# Patient Record
Sex: Male | Born: 1950
Health system: Southern US, Community
[De-identification: ages and names within clinical notes are randomized; demographics above are authoritative.]

## PROBLEM LIST (undated history)

## (undated) DIAGNOSIS — G629 Polyneuropathy, unspecified: Secondary | ICD-10-CM

## (undated) DIAGNOSIS — E119 Type 2 diabetes mellitus without complications: Secondary | ICD-10-CM

## (undated) DIAGNOSIS — I639 Cerebral infarction, unspecified: Secondary | ICD-10-CM

## (undated) DIAGNOSIS — I96 Gangrene, not elsewhere classified: Secondary | ICD-10-CM

## (undated) DIAGNOSIS — I10 Essential (primary) hypertension: Secondary | ICD-10-CM

## (undated) DIAGNOSIS — K219 Gastro-esophageal reflux disease without esophagitis: Secondary | ICD-10-CM

## (undated) DIAGNOSIS — D649 Anemia, unspecified: Secondary | ICD-10-CM

## (undated) DIAGNOSIS — J189 Pneumonia, unspecified organism: Secondary | ICD-10-CM

## (undated) DIAGNOSIS — I5032 Chronic diastolic (congestive) heart failure: Secondary | ICD-10-CM

## (undated) DIAGNOSIS — N400 Enlarged prostate without lower urinary tract symptoms: Secondary | ICD-10-CM

## (undated) DIAGNOSIS — I2510 Atherosclerotic heart disease of native coronary artery without angina pectoris: Secondary | ICD-10-CM

## (undated) DIAGNOSIS — E785 Hyperlipidemia, unspecified: Secondary | ICD-10-CM

## (undated) DIAGNOSIS — B1920 Unspecified viral hepatitis C without hepatic coma: Secondary | ICD-10-CM

## (undated) DIAGNOSIS — M869 Osteomyelitis, unspecified: Secondary | ICD-10-CM

## (undated) DIAGNOSIS — N183 Chronic kidney disease, stage 3 unspecified: Secondary | ICD-10-CM

## (undated) DIAGNOSIS — L03119 Cellulitis of unspecified part of limb: Secondary | ICD-10-CM

## (undated) DIAGNOSIS — L02619 Cutaneous abscess of unspecified foot: Secondary | ICD-10-CM

## (undated) DIAGNOSIS — N189 Chronic kidney disease, unspecified: Secondary | ICD-10-CM

## (undated) DIAGNOSIS — I739 Peripheral vascular disease, unspecified: Secondary | ICD-10-CM

## (undated) DIAGNOSIS — J45909 Unspecified asthma, uncomplicated: Secondary | ICD-10-CM

## (undated) DIAGNOSIS — I998 Other disorder of circulatory system: Secondary | ICD-10-CM

## (undated) DIAGNOSIS — J449 Chronic obstructive pulmonary disease, unspecified: Secondary | ICD-10-CM

## (undated) DIAGNOSIS — Z8701 Personal history of pneumonia (recurrent): Secondary | ICD-10-CM

## (undated) HISTORY — DX: Hyperlipidemia, unspecified: E78.5

## (undated) HISTORY — DX: Peripheral vascular disease, unspecified: I73.9

## (undated) HISTORY — DX: Personal history of pneumonia (recurrent): Z87.01

## (undated) HISTORY — DX: Atherosclerotic heart disease of native coronary artery without angina pectoris: I25.10

## (undated) HISTORY — PX: TONSILLECTOMY: SUR1361

## (undated) HISTORY — DX: Gastro-esophageal reflux disease without esophagitis: K21.9

## (undated) HISTORY — DX: Essential (primary) hypertension: I10

## (undated) HISTORY — PX: BACK SURGERY: SHX140

## (undated) HISTORY — DX: Anemia, unspecified: D64.9

## (undated) HISTORY — DX: Unspecified asthma, uncomplicated: J45.909

## (undated) HISTORY — DX: Chronic kidney disease, stage 3 unspecified: N18.30

---

## 2000-08-29 ENCOUNTER — Emergency Department (HOSPITAL_COMMUNITY): Admission: EM | Admit: 2000-08-29 | Discharge: 2000-08-29 | Payer: Self-pay | Admitting: Internal Medicine

## 2001-01-25 ENCOUNTER — Encounter: Payer: Self-pay | Admitting: Emergency Medicine

## 2001-01-25 ENCOUNTER — Emergency Department (HOSPITAL_COMMUNITY): Admission: EM | Admit: 2001-01-25 | Discharge: 2001-01-26 | Payer: Self-pay | Admitting: Emergency Medicine

## 2003-02-12 HISTORY — PX: LIVER BIOPSY: SHX301

## 2007-10-04 ENCOUNTER — Emergency Department (HOSPITAL_COMMUNITY): Admission: EM | Admit: 2007-10-04 | Discharge: 2007-10-04 | Payer: Self-pay | Admitting: Emergency Medicine

## 2008-03-26 ENCOUNTER — Emergency Department (HOSPITAL_COMMUNITY): Admission: EM | Admit: 2008-03-26 | Discharge: 2008-03-26 | Payer: Self-pay | Admitting: Emergency Medicine

## 2008-03-29 ENCOUNTER — Emergency Department (HOSPITAL_COMMUNITY): Admission: EM | Admit: 2008-03-29 | Discharge: 2008-03-29 | Payer: Self-pay | Admitting: Emergency Medicine

## 2010-05-29 LAB — CBC
HCT: 40.6 % (ref 39.0–52.0)
Hemoglobin: 13.8 g/dL (ref 13.0–17.0)
MCHC: 33.9 g/dL (ref 30.0–36.0)
MCV: 90.2 fL (ref 78.0–100.0)
Platelets: 179 10*3/uL (ref 150–400)
RBC: 4.51 MIL/uL (ref 4.22–5.81)
RDW: 12.7 % (ref 11.5–15.5)
WBC: 9 10*3/uL (ref 4.0–10.5)

## 2010-05-29 LAB — DIFFERENTIAL
Basophils Absolute: 0 10*3/uL (ref 0.0–0.1)
Basophils Relative: 1 % (ref 0–1)
Eosinophils Absolute: 0.2 10*3/uL (ref 0.0–0.7)
Eosinophils Relative: 2 % (ref 0–5)
Lymphocytes Relative: 31 % (ref 12–46)
Lymphs Abs: 2.8 10*3/uL (ref 0.7–4.0)
Monocytes Absolute: 0.7 10*3/uL (ref 0.1–1.0)
Monocytes Relative: 7 % (ref 3–12)
Neutro Abs: 5.3 10*3/uL (ref 1.7–7.7)
Neutrophils Relative %: 59 % (ref 43–77)

## 2010-05-29 LAB — BASIC METABOLIC PANEL
BUN: 14 mg/dL (ref 6–23)
CO2: 28 mEq/L (ref 19–32)
Calcium: 9.3 mg/dL (ref 8.4–10.5)
Chloride: 99 mEq/L (ref 96–112)
Creatinine, Ser: 0.82 mg/dL (ref 0.4–1.5)
GFR calc Af Amer: >60 mL/min (ref >60)
GFR calc non Af Amer: >60 mL/min (ref >60)
Glucose, Bld: 370 mg/dL — ABNORMAL HIGH (ref 70–99)
Potassium: 3.7 mEq/L (ref 3.5–5.1)
Sodium: 135 mEq/L (ref 135–145)

## 2010-05-29 LAB — GLUCOSE, CAPILLARY: Glucose-Capillary: 387 mg/dL — ABNORMAL HIGH (ref 70–99)

## 2011-02-03 ENCOUNTER — Emergency Department (HOSPITAL_COMMUNITY): Payer: Medicaid Other

## 2011-02-03 ENCOUNTER — Inpatient Hospital Stay (HOSPITAL_COMMUNITY)
Admission: EM | Admit: 2011-02-03 | Discharge: 2011-02-07 | DRG: 066 | Disposition: A | Discharge status: Home / Self Care | Payer: Medicaid Other | Attending: Internal Medicine | Admitting: Internal Medicine

## 2011-02-03 ENCOUNTER — Other Ambulatory Visit: Payer: Self-pay

## 2011-02-03 DIAGNOSIS — IMO0001 Reserved for inherently not codable concepts without codable children: Secondary | ICD-10-CM | POA: Diagnosis present

## 2011-02-03 DIAGNOSIS — F17200 Nicotine dependence, unspecified, uncomplicated: Secondary | ICD-10-CM | POA: Diagnosis present

## 2011-02-03 DIAGNOSIS — Z720 Tobacco use: Secondary | ICD-10-CM | POA: Diagnosis present

## 2011-02-03 DIAGNOSIS — F1910 Other psychoactive substance abuse, uncomplicated: Secondary | ICD-10-CM | POA: Diagnosis present

## 2011-02-03 DIAGNOSIS — F1010 Alcohol abuse, uncomplicated: Secondary | ICD-10-CM | POA: Diagnosis present

## 2011-02-03 DIAGNOSIS — F1410 Cocaine abuse, uncomplicated: Secondary | ICD-10-CM | POA: Diagnosis present

## 2011-02-03 DIAGNOSIS — I639 Cerebral infarction, unspecified: Secondary | ICD-10-CM

## 2011-02-03 DIAGNOSIS — R209 Unspecified disturbances of skin sensation: Secondary | ICD-10-CM | POA: Diagnosis present

## 2011-02-03 DIAGNOSIS — E785 Hyperlipidemia, unspecified: Secondary | ICD-10-CM | POA: Diagnosis present

## 2011-02-03 DIAGNOSIS — I6350 Cerebral infarction due to unspecified occlusion or stenosis of unspecified cerebral artery: Principal | ICD-10-CM | POA: Diagnosis present

## 2011-02-03 DIAGNOSIS — I10 Essential (primary) hypertension: Secondary | ICD-10-CM | POA: Diagnosis present

## 2011-02-03 DIAGNOSIS — R202 Paresthesia of skin: Secondary | ICD-10-CM | POA: Diagnosis present

## 2011-02-03 DIAGNOSIS — E1165 Type 2 diabetes mellitus with hyperglycemia: Secondary | ICD-10-CM

## 2011-02-03 DIAGNOSIS — R739 Hyperglycemia, unspecified: Secondary | ICD-10-CM

## 2011-02-03 HISTORY — DX: Essential (primary) hypertension: I10

## 2011-02-03 HISTORY — DX: Unspecified viral hepatitis C without hepatic coma: B19.20

## 2011-02-03 LAB — URINALYSIS, ROUTINE W REFLEX MICROSCOPIC
Glucose, UA: 500 mg/dL — AB
Leukocytes, UA: NEGATIVE
Nitrite: NEGATIVE
Protein, ur: NEGATIVE mg/dL

## 2011-02-03 LAB — DIFFERENTIAL
Basophils Relative: 0 % (ref 0–1)
Eosinophils Absolute: 0.2 10*3/uL (ref 0.0–0.7)
Monocytes Absolute: 0.5 10*3/uL (ref 0.1–1.0)
Monocytes Relative: 7 % (ref 3–12)

## 2011-02-03 LAB — COMPREHENSIVE METABOLIC PANEL
Albumin: 3.8 g/dL (ref 3.5–5.2)
BUN: 11 mg/dL (ref 6–23)
Chloride: 103 mEq/L (ref 96–112)
Creatinine, Ser: 0.9 mg/dL (ref 0.50–1.35)
GFR calc Af Amer: >90 mL/min (ref >90)
Total Bilirubin: 0.3 mg/dL (ref 0.3–1.2)
Total Protein: 8.2 g/dL (ref 6.0–8.3)

## 2011-02-03 LAB — CBC
HCT: 39.2 % (ref 39.0–52.0)
Hemoglobin: 13 g/dL (ref 13.0–17.0)
MCH: 29.4 pg (ref 26.0–34.0)
MCHC: 33.2 g/dL (ref 30.0–36.0)

## 2011-02-03 LAB — GLUCOSE, CAPILLARY: Glucose-Capillary: 172 mg/dL — ABNORMAL HIGH (ref 70–99)

## 2011-02-03 LAB — RAPID URINE DRUG SCREEN, HOSP PERFORMED
Amphetamines: NOT DETECTED
Opiates: NOT DETECTED

## 2011-02-03 MED ORDER — ASPIRIN 325 MG PO TABS
325.0000 mg | ORAL_TABLET | Freq: Every day | ORAL | Status: DC
  Administered 2011-02-03 – 2011-02-07 (×4): 325 mg via ORAL
  Filled 2011-02-03 (×4): qty 1

## 2011-02-03 MED ORDER — ASPIRIN 300 MG RE SUPP
300.0000 mg | Freq: Every day | RECTAL | Status: DC
  Administered 2011-02-05: 300 mg via RECTAL
  Filled 2011-02-03 (×4): qty 1

## 2011-02-03 MED ORDER — HYDRALAZINE HCL 20 MG/ML IJ SOLN
5.0000 mg | Freq: Four times a day (QID) | INTRAMUSCULAR | Status: DC | PRN
  Administered 2011-02-04: 5 mg via INTRAVENOUS
  Filled 2011-02-03: qty 1

## 2011-02-03 MED ORDER — ACETAMINOPHEN 650 MG RE SUPP: 650.0000 mg | RECTAL | Status: DC | PRN

## 2011-02-03 MED ORDER — SODIUM CHLORIDE 0.9 % IJ SOLN
INTRAMUSCULAR | Status: AC
  Filled 2011-02-03: qty 3

## 2011-02-03 MED ORDER — ENOXAPARIN SODIUM 40 MG/0.4ML ~~LOC~~ SOLN
40.0000 mg | SUBCUTANEOUS | Status: DC
  Administered 2011-02-03 – 2011-02-06 (×4): 40 mg via SUBCUTANEOUS
  Filled 2011-02-03 (×4): qty 0.4

## 2011-02-03 MED ORDER — SENNOSIDES-DOCUSATE SODIUM 8.6-50 MG PO TABS: 1.0000 | ORAL_TABLET | Freq: Every evening | ORAL | Status: DC | PRN

## 2011-02-03 MED ORDER — INSULIN ASPART 100 UNIT/ML ~~LOC~~ SOLN: 0.0000 [IU] | Freq: Every day | SUBCUTANEOUS | Status: DC

## 2011-02-03 MED ORDER — ONDANSETRON HCL 4 MG/2ML IJ SOLN: 4.0000 mg | Freq: Four times a day (QID) | INTRAMUSCULAR | Status: DC | PRN

## 2011-02-03 MED ORDER — SIMVASTATIN 20 MG PO TABS
20.0000 mg | ORAL_TABLET | Freq: Every day | ORAL | Status: DC
  Administered 2011-02-04 – 2011-02-06 (×3): 20 mg via ORAL
  Filled 2011-02-03 (×3): qty 1

## 2011-02-03 MED ORDER — LORAZEPAM 1 MG PO TABS
1.0000 mg | ORAL_TABLET | Freq: Four times a day (QID) | ORAL | Status: AC | PRN
  Filled 2011-02-03: qty 1

## 2011-02-03 MED ORDER — NICOTINE 14 MG/24HR TD PT24
14.0000 mg | MEDICATED_PATCH | Freq: Every day | TRANSDERMAL | Status: DC
  Administered 2011-02-03 – 2011-02-07 (×4): 14 mg via TRANSDERMAL
  Filled 2011-02-03 (×5): qty 1

## 2011-02-03 MED ORDER — INSULIN ASPART 100 UNIT/ML ~~LOC~~ SOLN
0.0000 [IU] | Freq: Three times a day (TID) | SUBCUTANEOUS | Status: DC
  Administered 2011-02-04: 3 [IU] via SUBCUTANEOUS
  Administered 2011-02-04: 2 [IU] via SUBCUTANEOUS
  Administered 2011-02-04: 3 [IU] via SUBCUTANEOUS
  Administered 2011-02-05: 15 [IU] via SUBCUTANEOUS
  Administered 2011-02-05: 5 [IU] via SUBCUTANEOUS
  Administered 2011-02-06 (×2): 3 [IU] via SUBCUTANEOUS
  Administered 2011-02-06: 2 [IU] via SUBCUTANEOUS
  Administered 2011-02-07: 5 [IU] via SUBCUTANEOUS

## 2011-02-03 MED ORDER — SODIUM CHLORIDE 0.9 % IJ SOLN
3.0000 mL | Freq: Two times a day (BID) | INTRAMUSCULAR | Status: DC
  Administered 2011-02-03 – 2011-02-07 (×5): 3 mL via INTRAVENOUS
  Filled 2011-02-03 (×5): qty 3

## 2011-02-03 MED ORDER — LORAZEPAM 2 MG/ML IJ SOLN
1.0000 mg | Freq: Four times a day (QID) | INTRAMUSCULAR | Status: AC | PRN
  Administered 2011-02-03: 1 mg via INTRAVENOUS
  Filled 2011-02-03: qty 1

## 2011-02-03 MED ORDER — ACETAMINOPHEN 325 MG PO TABS: 650.0000 mg | ORAL_TABLET | ORAL | Status: DC | PRN

## 2011-02-03 MED ORDER — HYDROCHLOROTHIAZIDE 25 MG PO TABS: 25.0000 mg | ORAL_TABLET | Freq: Every day | ORAL | Status: DC

## 2011-02-03 MED ORDER — VITAMIN B-1 100 MG PO TABS
100.0000 mg | ORAL_TABLET | Freq: Every day | ORAL | Status: DC
  Administered 2011-02-03 – 2011-02-07 (×5): 100 mg via ORAL
  Filled 2011-02-03 (×5): qty 1

## 2011-02-03 MED ORDER — LORAZEPAM 1 MG PO TABS
0.0000 mg | ORAL_TABLET | Freq: Four times a day (QID) | ORAL | Status: AC
  Administered 2011-02-05: 1 mg via ORAL

## 2011-02-03 MED ORDER — LORAZEPAM 1 MG PO TABS: 0.0000 mg | ORAL_TABLET | Freq: Two times a day (BID) | ORAL | Status: DC

## 2011-02-03 MED ORDER — FOLIC ACID 1 MG PO TABS
1.0000 mg | ORAL_TABLET | Freq: Every day | ORAL | Status: DC
  Administered 2011-02-03 – 2011-02-07 (×5): 1 mg via ORAL
  Filled 2011-02-03 (×5): qty 1

## 2011-02-03 MED ORDER — THIAMINE HCL 100 MG/ML IJ SOLN: 100.0000 mg | Freq: Every day | INTRAMUSCULAR | Status: DC

## 2011-02-03 MED ORDER — ADULT MULTIVITAMIN W/MINERALS CH
1.0000 | ORAL_TABLET | Freq: Every day | ORAL | Status: DC
  Administered 2011-02-03 – 2011-02-07 (×5): 1 via ORAL
  Filled 2011-02-03 (×5): qty 1

## 2011-02-03 NOTE — ED Notes (Signed)
Pt presents with numbness to right side of face, hand and leg since last night. No facial droop noted. Grips normal.

## 2011-02-03 NOTE — ED Notes (Signed)
Patient continues with numbness to right hand, no change from arrival.  RN questioned patient about his current medications, he advised he is supposed to take Metformin, but has no funds to obtain.  Education shared related to health dept, clinic, and other potential resources.  Two females/ family members sitting with patient.  IV site checked, site / dressing clean and infusing appropriately.

## 2011-02-03 NOTE — H&P (Signed)
Cory Weber is an 60 y.o. male.    PCP: He does not have a PCP. Needs one on discharge.  Chief Complaint: Right-sided numbness  HPI: This is a 60 year old, African American male, with a past medical history of, diabetes, hypertension, for which he is not taking any medications. He also has a history of hepatitis C. He was in his usual state of health till yesterday afternoon when he woke up from a nap and found that the right side of his body was numb. This was all the way from the face to the right arm and to right leg. Patient also was having some transient neck pain and right-sided chest pain, which resolved in a few minutes. He slept through the night and this morning when he woke up the numbness persisted. He also gives history of dropping things out of his right hand. He also had a feeling yesterday that his right leg was giving out. But he has been able to ambulate today without any difficulties. Since her symptoms persisted he spoke to his sister's about it and they recommended he come in to the hospital. Currently he still has some numbness in the right side, but denies any weakness on the right side. Denies any chest pain, nausea, vomiting, fever. Denies any recent illness. Denies any difficulty with his vision. Denies any speech difficulties. However, his sister's think that he is alert, but slow in communicating. Denies any difficulty swallowing. Denies any seizure type activity. Denies any problems urinating.   Prior to Admission medications   Not on File    Allergies: No Known Allergies  Past Medical History  Diagnosis Date  . Diabetes mellitus   . Hypertension   . Hepatitis C     Past Surgical History  Procedure Date  . Liver biopsy     Social History:  reports that he has been smoking.  He does not have any smokeless tobacco history on file. He reports that he drinks alcohol. He reports that he uses illicit drugs (Cocaine and Marijuana). he lives by himself in  [place].  Family History:  Family History  Problem Relation Age of Onset  . Breast cancer Mother   . Diabetes Father   . Hypertension Father   . Diabetes Sister   . Hypertension Sister   . Breast cancer Sister     Review of Systems - History obtained from the patient General ROS: negative Psychological ROS: negative Ophthalmic ROS: negative ENT ROS: negative Allergy and Immunology ROS: negative Hematological and Lymphatic ROS: negative Endocrine ROS: negative Respiratory ROS: negative Cardiovascular ROS: negative Gastrointestinal ROS: negative Genito-Urinary ROS: negative Musculoskeletal ROS: negative Neurological ROS: as in hpi Dermatological ROS: negative  Physical Examination Blood pressure 172/100, pulse 87, temperature 98.3 F (36.8 C), temperature source Oral, resp. rate 20, height 6\' 1"  (1.854 m), weight 90.719 kg (200 lb), SpO2 99.00%.  General appearance: alert, cooperative, appears older than stated age and no distress Head: Normocephalic, without obvious abnormality, atraumatic Eyes: conjunctivae/corneas clear. PERRL, EOM's intact. Fundi benign. Throat: normal findings: lips normal without lesions, palate normal, soft palate, uvula, and tonsils normal and palpation of salivary glands negative and abnormal findings: dentition: multiple carries Neck: no adenopathy, no carotid bruit, no JVD, supple, symmetrical, trachea midline and thyroid not enlarged, symmetric, no tenderness/mass/nodules Resp: clear to auscultation bilaterally Cardio: regular rate and rhythm, S1, S2 normal, no murmur, click, rub or gallop GI: soft, non-tender; bowel sounds normal; no masses,  no organomegaly Extremities: extremities normal, atraumatic, no  cyanosis or edema Pulses: 2+ and symmetric Skin: Skin color, texture, turgor normal. No rashes or lesions Lymph nodes: Cervical, supraclavicular, and axillary nodes normal. Neurologic: Mental status: Alert, oriented, thought content  appropriate Cranial nerves: normal Sensory: proprioceptive sense reduced right side on the right Motor: 5/5 Bilaterally Reflexes: 2+ and symmetric Coordination: normal  Results for orders placed during the hospital encounter of 02/03/11 (from the past 48 hour(s))  CBC     Status: Normal   Collection Time   02/03/11  5:34 PM      Component Value Range Comment   WBC 7.9  4.0 - 10.5 (K/uL)    RBC 4.42  4.22 - 5.81 (MIL/uL)    Hemoglobin 13.0  13.0 - 17.0 (g/dL)    HCT 39.2  39.0 - 52.0 (%)    MCV 88.7  78.0 - 100.0 (fL)    MCH 29.4  26.0 - 34.0 (pg)    MCHC 33.2  30.0 - 36.0 (g/dL)    RDW 12.5  11.5 - 15.5 (%)    Platelets 227  150 - 400 (K/uL)   DIFFERENTIAL     Status: Normal   Collection Time   02/03/11  5:34 PM      Component Value Range Comment   Neutrophils Relative 62  43 - 77 (%)    Neutro Abs 4.9  1.7 - 7.7 (K/uL)    Lymphocytes Relative 29  12 - 46 (%)    Lymphs Abs 2.3  0.7 - 4.0 (K/uL)    Monocytes Relative 7  3 - 12 (%)    Monocytes Absolute 0.5  0.1 - 1.0 (K/uL)    Eosinophils Relative 2  0 - 5 (%)    Eosinophils Absolute 0.2  0.0 - 0.7 (K/uL)    Basophils Relative 0  0 - 1 (%)    Basophils Absolute 0.0  0.0 - 0.1 (K/uL)   COMPREHENSIVE METABOLIC PANEL     Status: Abnormal   Collection Time   02/03/11  5:34 PM      Component Value Range Comment   Sodium 137  135 - 145 (mEq/L)    Potassium 3.9  3.5 - 5.1 (mEq/L)    Chloride 103  96 - 112 (mEq/L)    CO2 26  19 - 32 (mEq/L)    Glucose, Bld 215 (*) 70 - 99 (mg/dL)    BUN 11  6 - 23 (mg/dL)    Creatinine, Ser 0.90  0.50 - 1.35 (mg/dL)    Calcium 9.6  8.4 - 10.5 (mg/dL)    Total Protein 8.2  6.0 - 8.3 (g/dL)    Albumin 3.8  3.5 - 5.2 (g/dL)    AST 18  0 - 37 (U/L)    ALT 21  0 - 53 (U/L)    Alkaline Phosphatase 73  39 - 117 (U/L)    Total Bilirubin 0.3  0.3 - 1.2 (mg/dL)    GFR calc non Af Amer >90  >90 (mL/min)    GFR calc Af Amer >90  >90 (mL/min)   ETHANOL     Status: Normal   Collection Time    02/03/11  5:34 PM      Component Value Range Comment   Alcohol, Ethyl (B) <11  0 - 11 (mg/dL)   URINALYSIS, ROUTINE W REFLEX MICROSCOPIC     Status: Abnormal   Collection Time   02/03/11  7:10 PM      Component Value Range Comment   Color, Urine YELLOW  YELLOW  APPearance CLEAR  CLEAR     Specific Gravity, Urine 1.025  1.005 - 1.030     pH 6.0  5.0 - 8.0     Glucose, UA 500 (*) NEGATIVE (mg/dL)    Hgb urine dipstick NEGATIVE  NEGATIVE     Bilirubin Urine NEGATIVE  NEGATIVE     Ketones, ur NEGATIVE  NEGATIVE (mg/dL)    Protein, ur NEGATIVE  NEGATIVE (mg/dL)    Urobilinogen, UA 0.2  0.0 - 1.0 (mg/dL)    Nitrite NEGATIVE  NEGATIVE     Leukocytes, UA NEGATIVE  NEGATIVE  MICROSCOPIC NOT DONE ON URINES WITH NEGATIVE PROTEIN, BLOOD, LEUKOCYTES, NITRITE, OR GLUCOSE <1000 mg/dL.  URINE RAPID DRUG SCREEN (HOSP PERFORMED)     Status: Abnormal   Collection Time   02/03/11  7:10 PM      Component Value Range Comment   Opiates NONE DETECTED  NONE DETECTED     Cocaine POSITIVE (*) NONE DETECTED     Benzodiazepines NONE DETECTED  NONE DETECTED     Amphetamines NONE DETECTED  NONE DETECTED     Tetrahydrocannabinol NONE DETECTED  NONE DETECTED     Barbiturates NONE DETECTED  NONE DETECTED     Dg Chest 2 View  02/03/2011  *RADIOLOGY REPORT*  Clinical Data: Numbness and weakness.  CHEST - 2 VIEW  Comparison: PA and lateral chest 03/26/2008.  Findings: Lungs are clear.  Heart size is normal.  No pneumothorax or effusion.  IMPRESSION: No acute disease.  Original Report Authenticated By: Arvid Right. Luther Parody, M.D.   Ct Head Wo Contrast  02/03/2011  *RADIOLOGY REPORT*  Clinical Data:  Acute onset right-sided upper and lower extremity numbness.  Hypertension.  CT HEAD WITHOUT CONTRAST  Technique: Contiguous axial images were obtained from the base of the skull through the vertex without contrast  Comparison: None  Findings:  There is no evidence of intracranial hemorrhage, brain edema, or other signs of  acute infarction.  There is no evidence of intracranial mass lesion or mass effect.  No abnormal extraaxial fluid collections are identified.  There is no evidence of hydrocephalus, or other significant intracranial abnormality.  No skull abnormality identified.  IMPRESSION: Negative non-contrast head CT.  Original Report Authenticated By: Marlaine Hind, M.D.   EKG shows a sinus bradycardia at 58 beats per minute. Axis is normal. Intervals appear to be in the normal range. No concerning ST or T-wave changes are noted. There is some J-point elevation. No older EKGs available for comparison.  Assessment/Plan  Principal Problem:  *Hemiparesthesia Active Problems:  DM (diabetes mellitus), type 2, uncontrolled  Accelerated hypertension  Polysubstance abuse  Alcohol abuse  Tobacco abuse   #1 right hemiparesthesia: This is most likely a stroke. The neck pain was transient and he no longer has any neck pain. If the MRI of the brain does not show any stroke, cervical spine may have to be worked up. Stroke evaluation will be initiated in this individual. Aspirin will be given. A statin medication will be initiated. Patient has been told to be very compliant with his medications. He's been told that his diabetes, and blood pressure needs to be better controlled. He's also been counseled regarding his drug use.  #2 diabetes, type II, uncontrolled: To check HbA1c. We'll put him on a sliding scale. He will need definitive treatment at the time of discharge. Once all the stroke workup has been completed metformin may be initiated.  #3 accelerated hypertension: For, now we will allow permissive hypertension.  We will give him PRN medications for blood pressure over 180. From, tomorrow we'll initiate him on hydrochlorothiazide for long-term management of hypertension.  #4 polysubstance abuse: Education officer, museum will be involved in this patient's case. He's been provided with counseling.  #5 alcohol abuse: The  patient's sister thinks that he drinks more than one beer every day. Patient is adamant that he drinks only one beer every day. However, we will initiate him on alcohol withdrawal protocol.  #6 tobacco abuse: Counseling will be provided. Nicotine patch will utilize.  DVT, prophylaxis with Lovenox.  Patient is a full code.  Further management decisions will depend on results of further testing and patient's response to treatment.  [facility] Pager [phone]  02/03/2011, 8:13 PM

## 2011-02-03 NOTE — Plan of Care (Signed)
Problem: Consults Goal: Diabetes Guidelines if Diabetic/Glucose > 140 If diabetic or lab glucose is > 140 mg/dl - Initiate Diabetes/Hyperglycemia Guidelines & Document Interventions  Outcome: Progressing Pt not taking meds for diabetes at home. Needs more education and assistance regarding this issue.

## 2011-02-03 NOTE — ED Provider Notes (Signed)
History    This chart was scribed for Richarda Blade, MD, MD by Rhae Lerner. The patient was seen in room APA10 and the patient's care was started at 5:28PM.   CSN: [id number]  Arrival date & time 02/03/11  70   First MD Initiated Contact with Patient 02/03/11 1725      Chief Complaint  Patient presents with  . Numbness    (Consider location/radiation/quality/duration/timing/severity/associated sxs/prior treatment) The history is provided by the patient.   Cory Weber is a 60 y.o. male who presents to the Emergency Department complaining of numbness to right cheek of face, right foot and right arm radiating to hand and fingers onset 1 day ago. Numbness has been constant since onset. Pt denies headache, dizziness, stomach pain, diarrhea and vomiting. Pt has diabetes, HTN and Hepatitis C. Pt reports that he smokes cigarettes (1 pack/day) and drinks alcohol.   Past Medical History  Diagnosis Date  . Diabetes mellitus   . Hypertension     History reviewed. No pertinent past surgical history.  No family history on file.  History  Substance Use Topics  . Smoking status: Current Everyday Smoker -- 1.0 packs/day  . Smokeless tobacco: Not on file  . Alcohol Use: Yes     occ      Review of Systems  All other systems reviewed and are negative.  10 Systems reviewed and are negative for acute change except as noted in the HPI.   Allergies  Review of patient's allergies indicates no known allergies.  Home Medications  No current outpatient prescriptions on file.  BP 172/100  Pulse 87  Temp(Src) 98.3 F (36.8 C) (Oral)  Resp 20  Ht 6\' 1"  (1.854 m)  Wt 200 lb (90.719 kg)  BMI 26.39 kg/m2  SpO2 99%  Physical Exam  Nursing note and vitals reviewed. Constitutional: He is oriented to person, place, and time. He appears well-developed and well-nourished. No distress.  HENT:  Head: Normocephalic and atraumatic.  Left Ear: External ear normal.       Wax in right  ear preventing visualization of tm  Eyes: Conjunctivae and EOM are normal. Pupils are equal, round, and reactive to light.  Neck: Normal range of motion. Neck supple.  Cardiovascular: Normal rate, regular rhythm and normal heart sounds.        No carotid bruits   Pulmonary/Chest: Effort normal and breath sounds normal. No respiratory distress. He has no wheezes.  Abdominal: Soft. Bowel sounds are normal. He exhibits no distension. There is no tenderness.  Musculoskeletal: Normal range of motion. He exhibits no tenderness.       Decreased touch sensation in right arm, right hand, right cheek of face   Neurological: He is alert and oriented to person, place, and time. He has normal strength. He exhibits normal muscle tone. Coordination normal.       No ataxia  Grip nl   Skin: Skin is warm and dry.  Psychiatric: He has a normal mood and affect. His behavior is normal.    ED Course  Procedures (including critical care time)  Date: 02/03/2011  Rate: 58  Rhythm: sinus bradycardia  QRS Axis: normal  Intervals: normal  ST/T Wave abnormalities: normal  Conduction Disutrbances:none  Narrative Interpretation: rate slower  Old EKG Reviewed: changes noted  DIAGNOSTIC STUDIES: Oxygen Saturation is 99% on room air, normal by my interpretation.    COORDINATION OF CARE:   7:22PM Recheck: Pt reports that there is still numbness. EDP discusses lab  results with pt and possible treatment course. EDP tells pt he will be admitted for further testing and ddx of stroke.   Labs Reviewed  COMPREHENSIVE METABOLIC PANEL - Abnormal; Notable for the following:    Glucose, Bld 215 (*)    All other components within normal limits  URINALYSIS, ROUTINE W REFLEX MICROSCOPIC - Abnormal; Notable for the following:    Glucose, UA 500 (*)    All other components within normal limits  URINE RAPID DRUG SCREEN (HOSP PERFORMED) - Abnormal; Notable for the following:    Cocaine POSITIVE (*)    All other components  within normal limits  CBC  DIFFERENTIAL  ETHANOL   Dg Chest 2 View  02/03/2011  *RADIOLOGY REPORT*  Clinical Data: Numbness and weakness.  CHEST - 2 VIEW  Comparison: PA and lateral chest 03/26/2008.  Findings: Lungs are clear.  Heart size is normal.  No pneumothorax or effusion.  IMPRESSION: No acute disease.  Original Report Authenticated By: Arvid Right. Luther Parody, M.D.   Ct Head Wo Contrast  02/03/2011  *RADIOLOGY REPORT*  Clinical Data:  Acute onset right-sided upper and lower extremity numbness.  Hypertension.  CT HEAD WITHOUT CONTRAST  Technique: Contiguous axial images were obtained from the base of the skull through the vertex without contrast  Comparison: None  Findings:  There is no evidence of intracranial hemorrhage, brain edema, or other signs of acute infarction.  There is no evidence of intracranial mass lesion or mass effect.  No abnormal extraaxial fluid collections are identified.  There is no evidence of hydrocephalus, or other significant intracranial abnormality.  No skull abnormality identified.  IMPRESSION: Negative non-contrast head CT.  Original Report Authenticated By: Marlaine Hind, M.D.     1. CVA (cerebral infarction)   2. Hyperglycemia   3. Hypertension       MDM  Evaluation consistent with CVA, likely lacunar infarct. Patient is noncompliant of prior treatment for hypertension, hyperglycemia. No apparent disability associated with his history of hepatitis C. Patient is to be admitted for further risk stratification and management.  I personally performed the services described in this documentation, which was scribed in my presence. The recorded information has been reviewed and considered.        Richarda Blade, MD 02/03/11 [phone]

## 2011-02-04 ENCOUNTER — Observation Stay (HOSPITAL_COMMUNITY): Payer: Medicaid Other

## 2011-02-04 ENCOUNTER — Observation Stay (HOSPITAL_COMMUNITY): Payer: Self-pay

## 2011-02-04 DIAGNOSIS — I517 Cardiomegaly: Secondary | ICD-10-CM

## 2011-02-04 LAB — LIPID PANEL
HDL: 43 mg/dL (ref >39)
LDL Cholesterol: 112 mg/dL — ABNORMAL HIGH (ref 0–99)
Triglycerides: 139 mg/dL (ref <150)
VLDL: 28 mg/dL (ref 0–40)

## 2011-02-04 LAB — GLUCOSE, CAPILLARY

## 2011-02-04 MED ORDER — GLIPIZIDE 5 MG PO TABS
2.5000 mg | ORAL_TABLET | Freq: Every day | ORAL | Status: DC
  Administered 2011-02-05 – 2011-02-06 (×2): 2.5 mg via ORAL
  Filled 2011-02-04 (×3): qty 1

## 2011-02-04 MED ORDER — IOHEXOL 350 MG/ML SOLN
80.0000 mL | Freq: Once | INTRAVENOUS | Status: AC | PRN
  Administered 2011-02-04: 80 mL via INTRAVENOUS

## 2011-02-04 NOTE — Progress Notes (Signed)
Physical Therapy Evaluation Patient Details Name: Cory Weber MRN: [id number] DOB: 1950-10-19 Today's Date: 02/04/2011  Problem List:  Patient Active Problem List  Diagnoses  . DM (diabetes mellitus), type 2, uncontrolled  . Accelerated hypertension  . Hemiparesthesia  . Polysubstance abuse  . Alcohol abuse  . Tobacco abuse    Past Medical History:  Past Medical History  Diagnosis Date  . Diabetes mellitus   . Hypertension   . Hepatitis C    Past Surgical History:  Past Surgical History  Procedure Date  . Liver biopsy     PT Assessment/Plan/Recommendation PT Assessment PT Recommendation/Assessment: Patent does not need any further PT services No Skilled PT: Patient is modified independent with all activity/mobility PT Goals   none needed  PT Evaluation Precautions/Restrictions none   Prior Functioning  Home Living Lives With: Alone Type of Home: House Home Layout: One level Home Access: Stairs to enter Entrance Stairs-Rails: Right Entrance Stairs-Number of Steps: 3 Bathroom Shower/Tub: Chiropodist: Standard Prior Function Level of Independence: Independent with basic ADLs Cognition Cognition Arousal/Alertness: Awake/alert Overall Cognitive Status: Appears within functional limits for tasks assessed Sensation/Coordination Sensation Light Touch: Appears Intact Hot/Cold: Appears Intact Proprioception: Appears Intact Coordination Gross Motor Movements are Fluid and Coordinated: Yes Fine Motor Movements are Fluid and Coordinated: Not tested Extremity Assessment RUE Assessment RUE Assessment: Not tested LUE Assessment LUE Assessment: Not tested RLE Assessment RLE Assessment: Within Functional Limits LLE Assessment LLE Assessment: Within Functional Limits Mobility (including Balance) Bed Mobility Bed Mobility: Yes Supine to Sit: 7: Independent Sitting - Scoot to Edge of Bed: 7: Independent Transfers Transfers: Yes Sit to  Stand: 7: Independent Stand to Sit: 7: Independent Ambulation/Gait Ambulation/Gait: Yes Ambulation/Gait Assistance: 7: Independent Ambulation Distance (Feet): 80 Feet Assistive device: None Gait Pattern: Within Functional Limits Stairs: No Wheelchair Mobility Wheelchair Mobility: No  Posture/Postural Control Posture/Postural Control: No significant limitations Balance Balance Assessed: No Exercise    End of Session PT - End of Session Equipment Utilized During Treatment: Gait belt Activity Tolerance: Patient tolerated treatment well Patient left: in chair;with call bell in reach General Behavior During Session: [facility] for tasks performed Cognition: [facility] for tasks performed  Juergen Hardenbrook,CINDY 02/04/2011, 9:17 AM

## 2011-02-04 NOTE — Progress Notes (Signed)
CSW met with pt regarding substance use. Full note in shadow chart.  Pt reports monthly marijuana and cocaine use and states he only drinks 2-3 beers per week.  He does not feel this is a problem and refuses any treatment.  CSW to sign off.   Salome Arnt

## 2011-02-04 NOTE — Progress Notes (Signed)
Subjective:  He complained of left side fascia and numbness, upper extremity and lower extremity no numbness, but there is no weakness, he admitted noncompliance with his medication, he admitted to smoking, he denies any chest pain or shortness of breath, he denies any nausea or vomiting, during conversation patient admitted he had neck pain when he started having the episode Objective: Vital signs in last 24 hours: Filed Vitals:   02/04/11 0328 02/04/11 0437 02/04/11 0524 02/04/11 0659  BP: 171/91 177/110 172/96 157/85  Pulse: 61 54 82 63  Temp: 97.9 F (36.6 C)  97.7 F (36.5 C)   TempSrc: Oral  Oral   Resp: 20  20   Height:      Weight:   99.7 kg (219 lb 12.8 oz)   SpO2: 96%  95%    Weight change:   Intake/Output Summary (Last 24 hours) at 02/04/11 1153 Last data filed at 02/04/11 0700  Gross per 24 hour  Intake      0 ml  Output    675 ml  Net   -675 ml   Lying comfortably on bed and upper respiratory distress or shortness of breath Neck with no carotid bruit or lymphadenopathy or thyromegaly, poor oral hygiene, with multiple teeth out; no oral/ Heart S1 and S2 with no added sounds Lungs normal vesicular breathing with equal air entry Abdomen soft nontender bowel sounds present Extremities without lower limb edema peripheral pulses intact CNS patient is awake and fully oriented motor 5 over 5 bilaterally upper and lower, reflexes 2+ and symmetrical, some sensory findings motor loss over the right side on his face and hands, cerebral spine including finger-to-nose no dysmetria, no nystagmus,  Lab Results:  [facility] 02/03/11 1734  NA 137  K 3.9  CL 103  CO2 26  GLUCOSE 215*  BUN 11  CREATININE 0.90  CALCIUM 9.6  MG --  PHOS --    Basename 02/03/11 1734  AST 18  ALT 21  ALKPHOS 73  BILITOT 0.3  PROT 8.2  ALBUMIN 3.8   No results found for this basename: LIPASE:2,AMYLASE:2 in the last 72 hours  Basename 02/03/11 1734  WBC 7.9  NEUTROABS 4.9  HGB 13.0    HCT 39.2  MCV 88.7  PLT 227   No results found for this basename: CKTOTAL:3,CKMB:3,CKMBINDEX:3,TROPONINI:3 in the last 72 hours No components found with this basename: POCBNP:3 No results found for this basename: DDIMER:2 in the last 72 hours No results found for this basename: HGBA1C:2 in the last 72 hours  Basename 02/04/11 0605  CHOL 183  HDL 43  LDLCALC 112*  TRIG 139  CHOLHDL 4.3  LDLDIRECT --   No results found for this basename: TSH,T4TOTAL,FREET3,T3FREE,THYROIDAB in the last 72 hours No results found for this basename: VITAMINB12:2,FOLATE:2,FERRITIN:2,TIBC:2,IRON:2,RETICCTPCT:2 in the last 72 hours  Micro Results: No results found for this or any previous visit (from the past 240 hour(s)).  Studies/Results: Dg Chest 2 View  02/03/2011  *RADIOLOGY REPORT*  Clinical Data: Numbness and weakness.  CHEST - 2 VIEW  Comparison: PA and lateral chest 03/26/2008.  Findings: Lungs are clear.  Heart size is normal.  No pneumothorax or effusion.  IMPRESSION: No acute disease.  Original Report Authenticated By: Arvid Right. Luther Parody, M.D.   Ct Head Wo Contrast  02/03/2011  *RADIOLOGY REPORT*  Clinical Data:  Acute onset right-sided upper and lower extremity numbness.  Hypertension.  CT HEAD WITHOUT CONTRAST  Technique: Contiguous axial images were obtained from the base of the skull through  the vertex without contrast  Comparison: None  Findings:  There is no evidence of intracranial hemorrhage, brain edema, or other signs of acute infarction.  There is no evidence of intracranial mass lesion or mass effect.  No abnormal extraaxial fluid collections are identified.  There is no evidence of hydrocephalus, or other significant intracranial abnormality.  No skull abnormality identified.  IMPRESSION: Negative non-contrast head CT.  Original Report Authenticated By: Marlaine Thirza Pellicano, M.D.    Medications: I have reviewed the patient's current medications. Scheduled Meds:   . aspirin  300 mg  Rectal Daily   Or  . aspirin  325 mg Oral Daily  . enoxaparin  40 mg Subcutaneous Q24H  . folic acid  1 mg Oral Daily  . insulin aspart  0-15 Units Subcutaneous TID WC  . insulin aspart  0-5 Units Subcutaneous QHS  . LORazepam  0-4 mg Oral Q6H   Followed by  . LORazepam  0-4 mg Oral Q12H  . mulitivitamin with minerals  1 tablet Oral Daily  . nicotine  14 mg Transdermal Daily  . simvastatin  20 mg Oral q1800  . sodium chloride  3 mL Intravenous Q12H  . sodium chloride      . thiamine  100 mg Oral Daily   Or  . thiamine  100 mg Intravenous Daily  . DISCONTD: hydrochlorothiazide  25 mg Oral Daily   Continuous Infusions:  PRN Meds:.acetaminophen, acetaminophen, LORazepam, LORazepam, ondansetron (ZOFRAN) IV, senna-docusate, DISCONTD: hydrALAZINE  Assessment/Plan: 1-right side hememathesia with episode of neck pain reported me about cervical or carotid dissection,or cocaine induce ischemia currently MRI closed would proceed with CT angio of head and neck, carotid duplex, 2-D echo, I will hold blood pressure medication at this time, agree with aspirin 2- polysubstance abuse:councelling, agree with thiamine and folate 3- DM non compliance. iss will add metformin soon after CT ANGIO, glucotrol  4- cocaine positive in urine: could be related to the patient current symptoms  LOS: 1 day  Charlene Cowdrey I. 02/04/2011, 11:53 AM

## 2011-02-04 NOTE — Progress Notes (Signed)
*  PRELIMINARY RESULTS* Echocardiogram 2D Echocardiogram has been performed.  Tera Partridge 02/04/2011, 11:06 AM

## 2011-02-04 NOTE — Progress Notes (Signed)
UR Chart Review Completed  

## 2011-02-05 ENCOUNTER — Observation Stay (HOSPITAL_COMMUNITY): Payer: Medicaid Other

## 2011-02-05 LAB — BASIC METABOLIC PANEL
GFR calc Af Amer: >90 mL/min (ref >90)
GFR calc non Af Amer: >90 mL/min (ref >90)
Potassium: 3.5 mEq/L (ref 3.5–5.1)
Sodium: 135 mEq/L (ref 135–145)

## 2011-02-05 LAB — GLUCOSE, CAPILLARY
Glucose-Capillary: 351 mg/dL — ABNORMAL HIGH (ref 70–99)
Glucose-Capillary: 169 mg/dL — ABNORMAL HIGH (ref 70–99)

## 2011-02-05 LAB — TSH: TSH: 4.359 u[IU]/mL (ref 0.350–4.500)

## 2011-02-05 MED ORDER — HYDROCHLOROTHIAZIDE 25 MG PO TABS
25.0000 mg | ORAL_TABLET | Freq: Every day | ORAL | Status: DC
  Administered 2011-02-05 – 2011-02-07 (×3): 25 mg via ORAL
  Filled 2011-02-05 (×3): qty 1

## 2011-02-05 MED ORDER — METFORMIN HCL 500 MG PO TABS
500.0000 mg | ORAL_TABLET | Freq: Two times a day (BID) | ORAL | Status: DC
  Administered 2011-02-05 – 2011-02-07 (×4): 500 mg via ORAL
  Filled 2011-02-05 (×4): qty 1

## 2011-02-05 NOTE — Progress Notes (Signed)
Subjective: He still complained of numbness mainly on his right upper extremity right lower extremities, he denies any chest pain or shortness of breath, CT angiogram of the head and neck did not show any evidence of acute stroke orcervical or carotid for dissection, and currently he denies any neck pain  Objective: Vital signs in last 24 hours: Filed Vitals:   02/04/11 2024 02/05/11 0143 02/05/11 0545 02/05/11 1331  BP: 182/96 146/61 151/75 143/73  Pulse: 60 68 59 62  Temp: 98.4 F (36.9 C) 98 F (36.7 C) 97.9 F (36.6 C) 97.8 F (36.6 C)  TempSrc: Oral Oral Oral Oral  Resp: 20 16 16 16   Height:      Weight:   99.2 kg (218 lb 11.1 oz)   SpO2: 97% 95% 90% 92%   Weight change: 8.481 kg (18 lb 11.1 oz)  Intake/Output Summary (Last 24 hours) at 02/05/11 1424 Last data filed at 02/04/11 1737  Gross per 24 hour  Intake    300 ml  Output      0 ml  Net    300 ml    He is lying comfortably on bed his not on respiratory distress or shortness of breath Neck supple with no definite lymph node, no JVP Heart S1 and S2 with no added sounds Lungs examination normal vesicular breathing with equal air entry Abdomen soft nontender bowel sounds present Extremities without edema CNS power 5 over 5 bilaterally lower and upper extremities He has decreased sensation on his lateral side of his arm and as the patient on his lower extremity there is decreased sensation, decreased methocarbamol on the right lower extremity compared to the left  Lab Results:  [facility] 02/05/11 0545 02/03/11 1734  NA 135 137  K 3.5 3.9  CL 101 103  CO2 26 26  GLUCOSE 202* 215*  BUN 8 11  CREATININE 0.90 0.90  CALCIUM 9.6 9.6  MG -- --  PHOS -- --    Basename 02/03/11 1734  AST 18  ALT 21  ALKPHOS 73  BILITOT 0.3  PROT 8.2  ALBUMIN 3.8   No results found for this basename: LIPASE:2,AMYLASE:2 in the last 72 hours  Basename 02/03/11 1734  WBC 7.9  NEUTROABS 4.9  HGB 13.0  HCT 39.2  MCV 88.7    PLT 227   No results found for this basename: CKTOTAL:3,CKMB:3,CKMBINDEX:3,TROPONINI:3 in the last 72 hours No components found with this basename: POCBNP:3 No results found for this basename: DDIMER:2 in the last 72 hours  Basename 02/04/11 0605  HGBA1C 8.5*    Basename 02/04/11 0605  CHOL 183  HDL 43  LDLCALC 112*  TRIG 139  CHOLHDL 4.3  LDLDIRECT --   No results found for this basename: TSH,T4TOTAL,FREET3,T3FREE,THYROIDAB in the last 72 hours No results found for this basename: VITAMINB12:2,FOLATE:2,FERRITIN:2,TIBC:2,IRON:2,RETICCTPCT:2 in the last 72 hours  Micro Results: No results found for this or any previous visit (from the past 240 hour(s)).  Studies/Results: Ct Angio Head W/cm &/or Wo Cm  02/04/2011  *RADIOLOGY REPORT*  Clinical Data:  Right arm numbness.  Neck pain.  Rule out dissection.  Rule out stroke.  Diabetes  CT ANGIOGRAPHY HEAD AND NECK  Technique:  Multidetector CT imaging of the head and neck was performed using the standard protocol during bolus administration of intravenous contrast.  Multiplanar CT image reconstructions including MIPs were obtained to evaluate the vascular anatomy. Carotid stenosis measurements (when applicable) are obtained utilizing NASCET criteria, using the distal internal carotid diameter as the denominator.  Contrast: 66mL OMNIPAQUE IOHEXOL 350 MG/ML IV SOLN  Comparison:  02/03/2011 CT head  CTA NECK  Findings:  Right carotid:  Right common carotid artery is widely patent.  Calcified plaque involving the proximal right internal carotid artery not causing significant stenosis.  Remainder of the right carotid bifurcation is patent.  Negative for carotid dissection.  Left carotid:  Mild left atherosclerotic thickening involving the common carotid artery without significant stenosis.  Small calcified plaque involving the proximal left internal carotid artery.  Negative for carotid dissection.  Vertebral arteries:  Left vertebral artery is  dominant and patent to the basilar. Calcified plaque involving the distal left internal carotid artery.  Right vertebral artery is a small vessel that ends in pica and does not contribute to the basilar.  Negative for mass or adenopathy in the neck.  Contrast opacification is not optimal for the study and there is significant venous contrast degrading the image quality.   Review of the MIP images confirms the above findings.  IMPRESSION: Mild atherosclerotic disease involving the internal carotid artery bilaterally.  No significant carotid stenosis or dissection is identified.  Dominant left vertebral artery with scattered atherosclerotic disease.  Right vertebral artery is small and ends in pica.  CTA HEAD  Findings:  Ventricles are normal.  No enhancing mass lesion is identified.  Negative for acute infarct or hemorrhage.  Arterial opacification is suboptimal due to timing of the injection.  Intracranial vessels are relatively small and irregular compatible with diffuse atherosclerotic disease.  Right vertebral artery ends in pica.  Left vertebral artery contains a calcified plaque and mild stenosis.  There is diffuse disease in the basilar which is patent.  Cerebellar arteries are not optimally evaluated on this study.  Posterior cerebral arteries are patent bilaterally and appear to be diffusely diseased.  Extensive atherosclerotic calcification in the cavernous carotid bilaterally.  Anterior and middle cerebral arteries are diffusely diseased with multiple areas of stenosis and irregularity compatible with atherosclerotic disease.  No large vessel occlusion.  Prominent vessel below the left M1 segment is felt to be a vein.  No definite aneurysm.   Review of the MIP images confirms the above findings.  IMPRESSION: Diffuse intracranial atherosclerotic disease.  This exam is not optimal for evaluation of small vessels however the intracranial disease appears to be moderate to advanced.  Original Report  Authenticated By: Truett Perna, M.D.   Dg Chest 2 View  02/03/2011  *RADIOLOGY REPORT*  Clinical Data: Numbness and weakness.  CHEST - 2 VIEW  Comparison: PA and lateral chest 03/26/2008.  Findings: Lungs are clear.  Heart size is normal.  No pneumothorax or effusion.  IMPRESSION: No acute disease.  Original Report Authenticated By: Arvid Right. Luther Parody, M.D.   Ct Head Wo Contrast  02/03/2011  *RADIOLOGY REPORT*  Clinical Data:  Acute onset right-sided upper and lower extremity numbness.  Hypertension.  CT HEAD WITHOUT CONTRAST  Technique: Contiguous axial images were obtained from the base of the skull through the vertex without contrast  Comparison: None  Findings:  There is no evidence of intracranial hemorrhage, brain edema, or other signs of acute infarction.  There is no evidence of intracranial mass lesion or mass effect.  No abnormal extraaxial fluid collections are identified.  There is no evidence of hydrocephalus, or other significant intracranial abnormality.  No skull abnormality identified.  IMPRESSION: Negative non-contrast head CT.  Original Report Authenticated By: Marlaine Jaymason Ledesma, M.D.   US Carotid Duplex Bilateral  02/05/2011  *RADIOLOGY REPORT*  Clinical Data: Hypertension, stroke, diabetes, smoker  BILATERAL CAROTID DUPLEX ULTRASOUND  Technique: Pearline Cables scale imaging, color Doppler and duplex ultrasound was performed of bilateral carotid and vertebral arteries in the neck.  Comparison:  None.  Criteria:  Quantification of carotid stenosis is based on velocity parameters that correlate the residual internal carotid diameter with NASCET-based stenosis levels, using the diameter of the distal internal carotid lumen as the denominator for stenosis measurement.  The following velocity measurements were obtained:                   PEAK SYSTOLIC/END DIASTOLIC RIGHT ICA:                        75/26cm/sec CCA:                        [id number] SYSTOLIC ICA/CCA RATIO:     [id number] DIASTOLIC ICA/CCA  RATIO:    1.65 ECA:                        77cm/sec  LEFT ICA:                        71/23cm/sec CCA:                        [id number] SYSTOLIC ICA/CCA RATIO:     [id number] DIASTOLIC ICA/CCA RATIO:    1.18 ECA:                        96cm/sec  Findings:  RIGHT CAROTID ARTERY: Very minor carotid atherosclerotic changes. No hemodynamically significant right ICA stenosis, velocity elevation, or turbulent flow.  RIGHT VERTEBRAL ARTERY:  Antegrade  LEFT CAROTID ARTERY: Minor diffuse left carotid plaque formation. No hemodynamically significant left ICA stenosis, velocity elevation, or turbulent flow.  LEFT VERTEBRAL ARTERY:  Antegrade  IMPRESSION: Minor carotid atherosclerosis.  No hemodynamically significant ICA stenosis on either side.  Original Report Authenticated By: Jerilynn Mages. Daryll Brod, M.D.    Medications: I have reviewed the patient's current medications. Scheduled Meds:   . aspirin  300 mg Rectal Daily   Or  . aspirin  325 mg Oral Daily  . enoxaparin  40 mg Subcutaneous Q24H  . folic acid  1 mg Oral Daily  . glipiZIDE  2.5 mg Oral QAC breakfast  . hydrochlorothiazide  25 mg Oral Daily  . insulin aspart  0-15 Units Subcutaneous TID WC  . insulin aspart  0-5 Units Subcutaneous QHS  . LORazepam  0-4 mg Oral Q6H   Followed by  . LORazepam  0-4 mg Oral Q12H  . mulitivitamin with minerals  1 tablet Oral Daily  . nicotine  14 mg Transdermal Daily  . simvastatin  20 mg Oral q1800  . sodium chloride  3 mL Intravenous Q12H  . thiamine  100 mg Oral Daily   Or  . thiamine  100 mg Intravenous Daily   Continuous Infusions:  PRN Meds:.acetaminophen, acetaminophen, LORazepam, LORazepam, ondansetron (ZOFRAN) IV, senna-docusate  Assessment/Plan: 1-left side hemiathesia was no hemiparesis, could be related to TIA, CT and June negative for acute stroke, patient would likely discharge tomorrow, and today he he felt he would like to arrange his outpatient follow up,, and as the patient has numbness on his  right lower extremity but has would be addressed there has a good circulation by  examination, but diminishedpulse on the right. Would order ABI OF his right lower extremity, we'll ask case manager to see the patient in a.m. for medication and arrange followup, patient admitted he does not havePHONE even to call for has health serv. 2- HTN on HCTZ 3-mellitus noncompliance with  glipizide and add metformin 500 mg by mouth twice a day 4- RIght lower extremity numbness:could be diabetic complication but has diminished pulse on his lower extremity compare with left , ABI , can be dc afterwards to follow up with MD  As out patient.  LOS: 2 days  Lealer Marsland I. 02/05/2011, 2:24 PM

## 2011-02-06 ENCOUNTER — Observation Stay (HOSPITAL_COMMUNITY): Payer: Medicaid Other

## 2011-02-06 LAB — GLUCOSE, CAPILLARY
Glucose-Capillary: 164 mg/dL — ABNORMAL HIGH (ref 70–99)
Glucose-Capillary: 132 mg/dL — ABNORMAL HIGH (ref 70–99)
Glucose-Capillary: 141 mg/dL — ABNORMAL HIGH (ref 70–99)

## 2011-02-06 LAB — BASIC METABOLIC PANEL
Calcium: 9.8 mg/dL (ref 8.4–10.5)
Creatinine, Ser: 0.88 mg/dL (ref 0.50–1.35)
GFR calc non Af Amer: >90 mL/min (ref >90)
Glucose, Bld: 186 mg/dL — ABNORMAL HIGH (ref 70–99)
Sodium: 135 mEq/L (ref 135–145)

## 2011-02-06 MED ORDER — GLIPIZIDE 5 MG PO TABS
2.5000 mg | ORAL_TABLET | Freq: Two times a day (BID) | ORAL | Status: DC
  Administered 2011-02-06 – 2011-02-07 (×2): 2.5 mg via ORAL
  Filled 2011-02-06 (×2): qty 1

## 2011-02-06 NOTE — Progress Notes (Signed)
Notified Dr. Megan Salon of MRI results (see Results Review), no new orders received. Cory Weber

## 2011-02-06 NOTE — Progress Notes (Signed)
Subjective: This man symptoms of right-sided numbness have almost resolved except for some numbness in his right hand fingers. He clearly had no weakness. Imaging studies so far have been negative for CVA. He is due to have MRI of the brain today. He did have cocaine in his urine.           Physical Exam: Blood pressure 158/88, pulse 59, temperature 97.8 F (36.6 C), temperature source Oral, resp. rate 20, height 6\' 1"  (1.854 m), weight 99.2 kg (218 lb 11.1 oz), SpO2 95.00%. He looks systemically well. He is alert and orientated. There are absolutely no focal neurological signs. In particular, there is no facial abnormalities, his power in his arms and legs are 5 out of 5. He has no cognitive difficulties. His speech is normal. Heart sounds are present and normal. Lung fields are clear.   Investigations:     Basic Metabolic Panel:  Basename 02/06/11 0611 02/05/11 0545  NA 135 135  K 3.7 3.5  CL 101 101  CO2 27 26  GLUCOSE 186* 202*  BUN 12 8  CREATININE 0.88 0.90  CALCIUM 9.8 9.6  MG -- --  PHOS -- --   Liver Function Tests:  [facility] 02/03/11 1734  AST 18  ALT 21  ALKPHOS 73  BILITOT 0.3  PROT 8.2  ALBUMIN 3.8     CBC:  Basename 02/03/11 1734  WBC 7.9  NEUTROABS 4.9  HGB 13.0  HCT 39.2  MCV 88.7  PLT 227    Ct Angio Head W/cm &/or Wo Cm  02/04/2011  *RADIOLOGY REPORT*  Clinical Data:  Right arm numbness.  Neck pain.  Rule out dissection.  Rule out stroke.  Diabetes  CT ANGIOGRAPHY HEAD AND NECK  Technique:  Multidetector CT imaging of the head and neck was performed using the standard protocol during bolus administration of intravenous contrast.  Multiplanar CT image reconstructions including MIPs were obtained to evaluate the vascular anatomy. Carotid stenosis measurements (when applicable) are obtained utilizing NASCET criteria, using the distal internal carotid diameter as the denominator.  Contrast: 66mL OMNIPAQUE IOHEXOL 350 MG/ML IV SOLN   Comparison:  02/03/2011 CT head  CTA NECK  Findings:  Right carotid:  Right common carotid artery is widely patent.  Calcified plaque involving the proximal right internal carotid artery not causing significant stenosis.  Remainder of the right carotid bifurcation is patent.  Negative for carotid dissection.  Left carotid:  Mild left atherosclerotic thickening involving the common carotid artery without significant stenosis.  Small calcified plaque involving the proximal left internal carotid artery.  Negative for carotid dissection.  Vertebral arteries:  Left vertebral artery is dominant and patent to the basilar. Calcified plaque involving the distal left internal carotid artery.  Right vertebral artery is a small vessel that ends in pica and does not contribute to the basilar.  Negative for mass or adenopathy in the neck.  Contrast opacification is not optimal for the study and there is significant venous contrast degrading the image quality.   Review of the MIP images confirms the above findings.  IMPRESSION: Mild atherosclerotic disease involving the internal carotid artery bilaterally.  No significant carotid stenosis or dissection is identified.  Dominant left vertebral artery with scattered atherosclerotic disease.  Right vertebral artery is small and ends in pica.  CTA HEAD  Findings:  Ventricles are normal.  No enhancing mass lesion is identified.  Negative for acute infarct or hemorrhage.  Arterial opacification is suboptimal due to timing of the injection.  Intracranial vessels are relatively small and irregular compatible with diffuse atherosclerotic disease.  Right vertebral artery ends in pica.  Left vertebral artery contains a calcified plaque and mild stenosis.  There is diffuse disease in the basilar which is patent.  Cerebellar arteries are not optimally evaluated on this study.  Posterior cerebral arteries are patent bilaterally and appear to be diffusely diseased.  Extensive atherosclerotic  calcification in the cavernous carotid bilaterally.  Anterior and middle cerebral arteries are diffusely diseased with multiple areas of stenosis and irregularity compatible with atherosclerotic disease.  No large vessel occlusion.  Prominent vessel below the left M1 segment is felt to be a vein.  No definite aneurysm.   Review of the MIP images confirms the above findings.  IMPRESSION: Diffuse intracranial atherosclerotic disease.  This exam is not optimal for evaluation of small vessels however the intracranial disease appears to be moderate to advanced.  Original Report Authenticated By: Truett Perna, M.D.   US Carotid Duplex Bilateral  02/05/2011  *RADIOLOGY REPORT*  Clinical Data: Hypertension, stroke, diabetes, smoker  BILATERAL CAROTID DUPLEX ULTRASOUND  Technique: Pearline Cables scale imaging, color Doppler and duplex ultrasound was performed of bilateral carotid and vertebral arteries in the neck.  Comparison:  None.  Criteria:  Quantification of carotid stenosis is based on velocity parameters that correlate the residual internal carotid diameter with NASCET-based stenosis levels, using the diameter of the distal internal carotid lumen as the denominator for stenosis measurement.  The following velocity measurements were obtained:                   PEAK SYSTOLIC/END DIASTOLIC RIGHT ICA:                        75/26cm/sec CCA:                        [id number] SYSTOLIC ICA/CCA RATIO:     [id number] DIASTOLIC ICA/CCA RATIO:    1.65 ECA:                        77cm/sec  LEFT ICA:                        71/23cm/sec CCA:                        [id number] SYSTOLIC ICA/CCA RATIO:     [id number] DIASTOLIC ICA/CCA RATIO:    1.18 ECA:                        96cm/sec  Findings:  RIGHT CAROTID ARTERY: Very minor carotid atherosclerotic changes. No hemodynamically significant right ICA stenosis, velocity elevation, or turbulent flow.  RIGHT VERTEBRAL ARTERY:  Antegrade  LEFT CAROTID ARTERY: Minor diffuse left carotid plaque  formation. No hemodynamically significant left ICA stenosis, velocity elevation, or turbulent flow.  LEFT VERTEBRAL ARTERY:  Antegrade  IMPRESSION: Minor carotid atherosclerosis.  No hemodynamically significant ICA stenosis on either side.  Original Report Authenticated By: Jerilynn Mages. Daryll Brod, M.D.      Medications: I have reviewed the patient's current medications.  Impression: 1. Right-sided hemiparesthesia, unclear etiology. 2. Cocaine abuse. 3. Uncontrolled type 2 diabetes mellitus. 4. Tobacco abuse.     Plan: 1. Await MRI of the brain. 2. Increase glipizide dose.     LOS: 3 days   Doree Albee Pager [phone]  02/06/2011, 11:09 AM

## 2011-02-06 NOTE — Progress Notes (Signed)
Inpatient Diabetes Program Recommendations  AACE/ADA: New Consensus Statement on Inpatient Glycemic Control (2009)  Target Ranges:  Prepandial:   less than 140 mg/dL      Peak postprandial:   less than 180 mg/dL (1-2 hours)      Critically ill patients:  140 - 180 mg/dL   Reason for Visit: History of diabetes  Inpatient Diabetes Program Recommendations Diet: Add CHO modified medium to diet

## 2011-02-06 NOTE — Progress Notes (Signed)
CARE MANAGEMENT NOTE 02/06/2011  Patient:  Cory Weber, Cory Weber   Account Number:  [id number]  Date Initiated:  02/06/2011  Documentation initiated by:  Vladimir Creeks  Subjective/Objective Assessment:   admitted with rt sided weakness, r/o CVA. pt is from home, and will return home. Referred to B. Ratliff, financial counselor for assistance and may need assistance with meds at D/C     Action/Plan:   If therapy needed OP Therapy  here may  be appropriate.   Anticipated DC Date:  02/07/2011   Anticipated DC Plan:  [place]  CM consult      Choice offered to / List presented to:             Status of service:   Medicare Important Message given?   (If response is "NO", the following Medicare IM given date fields will be blank) Date Medicare IM given:   Date Additional Medicare IM given:    Discharge Disposition:  HOME/SELF CARE  Per UR Regulation:    Comments:  02/06/11 [place]

## 2011-02-06 NOTE — Plan of Care (Signed)
Problem: Phase I Progression Outcomes Goal: OOB as tolerated unless otherwise ordered Outcome: Completed/Met Date Met:  02/06/11 Pt ambulates in room frequently.

## 2011-02-06 NOTE — Progress Notes (Signed)
CSW spoke with pt as referral received stating pt is homeless.  Pt reports he has somewhere to stay but is concerned about medications.  CSW referred to CM and will sign off.  CSW already addressed substance abuse on 02/04/11.  Cory Weber

## 2011-02-07 LAB — BASIC METABOLIC PANEL
CO2: 26 mEq/L (ref 19–32)
Calcium: 10 mg/dL (ref 8.4–10.5)
Chloride: 98 mEq/L (ref 96–112)
Glucose, Bld: 174 mg/dL — ABNORMAL HIGH (ref 70–99)
Sodium: 134 mEq/L — ABNORMAL LOW (ref 135–145)

## 2011-02-07 MED ORDER — SIMVASTATIN 20 MG PO TABS: 20.0000 mg | ORAL_TABLET | Freq: Every day | ORAL | Status: DC

## 2011-02-07 MED ORDER — METFORMIN HCL 500 MG PO TABS: 500.0000 mg | ORAL_TABLET | Freq: Two times a day (BID) | ORAL | Status: DC

## 2011-02-07 MED ORDER — ASPIRIN 325 MG PO TABS: 325.0000 mg | ORAL_TABLET | Freq: Every day | ORAL | Status: DC

## 2011-02-07 MED ORDER — HYDROCHLOROTHIAZIDE 25 MG PO TABS: 25.0000 mg | ORAL_TABLET | Freq: Every day | ORAL | Status: DC

## 2011-02-07 MED ORDER — NICOTINE 14 MG/24HR TD PT24: 1.0000 | MEDICATED_PATCH | Freq: Every day | TRANSDERMAL | Status: AC

## 2011-02-07 MED ORDER — GLIPIZIDE 2.5 MG HALF TABLET: 2.5000 mg | ORAL_TABLET | Freq: Two times a day (BID) | ORAL | Status: DC

## 2011-02-07 NOTE — Progress Notes (Signed)
PIV removed without complaint, patient discharged home. Patient verbalizes understanding of discharge instructions, follow up care and prescriptions. Patient escorted out by staff, transported by family. 

## 2011-02-07 NOTE — Discharge Summary (Signed)
Physician Discharge Summary  Patient ID: Cory Weber MRN: [id number] DOB/AGE: December 31, 1950 60 y.o.  Admit date: 02/03/2011 Discharge date: 02/07/2011    Discharge Diagnoses:  1. Left lateral thalamus acute CVA 5 mm focus, clinically improved. 2. Type 2 diabetes mellitus. 3. Hypertension. 4. Hyperlipidemia. 5. Tobacco abuse. 6. Cocaine abuse.   Current Discharge Medication List    START taking these medications   Details  aspirin 325 MG tablet Take 1 tablet (325 mg total) by mouth daily. Qty: 30 tablet, Refills: 0    glipiZIDE (GLUCOTROL) 2.5 mg TABS Take 0.5 tablets (2.5 mg total) by mouth 2 (two) times daily before a meal. Qty: 60 tablet, Refills: 0    hydrochlorothiazide (HYDRODIURIL) 25 MG tablet Take 1 tablet (25 mg total) by mouth daily. Qty: 30 tablet, Refills: 0    metFORMIN (GLUCOPHAGE) 500 MG tablet Take 1 tablet (500 mg total) by mouth 2 (two) times daily with a meal. Qty: 60 tablet, Refills: 0    nicotine (NICODERM CQ - DOSED IN MG/24 HOURS) 14 mg/24hr patch Place 1 patch onto the skin daily. Qty: 28 patch, Refills: 0    simvastatin (ZOCOR) 20 MG tablet Take 1 tablet (20 mg total) by mouth daily at 6 PM. Qty: 30 tablet, Refills: 0        Discharged Condition: Improved and stable.    Consults: None.  Significant Diagnostic Studies: Ct Angio Head W/cm &/or Wo Cm  02/04/2011  *RADIOLOGY REPORT*  Clinical Data:  Right arm numbness.  Neck pain.  Rule out dissection.  Rule out stroke.  Diabetes  CT ANGIOGRAPHY HEAD AND NECK  Technique:  Multidetector CT imaging of the head and neck was performed using the standard protocol during bolus administration of intravenous contrast.  Multiplanar CT image reconstructions including MIPs were obtained to evaluate the vascular anatomy. Carotid stenosis measurements (when applicable) are obtained utilizing NASCET criteria, using the distal internal carotid diameter as the denominator.  Contrast: 22mL OMNIPAQUE IOHEXOL  350 MG/ML IV SOLN  Comparison:  02/03/2011 CT head  CTA NECK  Findings:  Right carotid:  Right common carotid artery is widely patent.  Calcified plaque involving the proximal right internal carotid artery not causing significant stenosis.  Remainder of the right carotid bifurcation is patent.  Negative for carotid dissection.  Left carotid:  Mild left atherosclerotic thickening involving the common carotid artery without significant stenosis.  Small calcified plaque involving the proximal left internal carotid artery.  Negative for carotid dissection.  Vertebral arteries:  Left vertebral artery is dominant and patent to the basilar. Calcified plaque involving the distal left internal carotid artery.  Right vertebral artery is a small vessel that ends in pica and does not contribute to the basilar.  Negative for mass or adenopathy in the neck.  Contrast opacification is not optimal for the study and there is significant venous contrast degrading the image quality.   Review of the MIP images confirms the above findings.  IMPRESSION: Mild atherosclerotic disease involving the internal carotid artery bilaterally.  No significant carotid stenosis or dissection is identified.  Dominant left vertebral artery with scattered atherosclerotic disease.  Right vertebral artery is small and ends in pica.  CTA HEAD  Findings:  Ventricles are normal.  No enhancing mass lesion is identified.  Negative for acute infarct or hemorrhage.  Arterial opacification is suboptimal due to timing of the injection.  Intracranial vessels are relatively small and irregular compatible with diffuse atherosclerotic disease.  Right vertebral artery ends in pica.  Left vertebral artery contains a calcified plaque and mild stenosis.  There is diffuse disease in the basilar which is patent.  Cerebellar arteries are not optimally evaluated on this study.  Posterior cerebral arteries are patent bilaterally and appear to be diffusely diseased.  Extensive  atherosclerotic calcification in the cavernous carotid bilaterally.  Anterior and middle cerebral arteries are diffusely diseased with multiple areas of stenosis and irregularity compatible with atherosclerotic disease.  No large vessel occlusion.  Prominent vessel below the left M1 segment is felt to be a vein.  No definite aneurysm.   Review of the MIP images confirms the above findings.  IMPRESSION: Diffuse intracranial atherosclerotic disease.  This exam is not optimal for evaluation of small vessels however the intracranial disease appears to be moderate to advanced.  Original Report Authenticated By: Truett Perna, M.D.   Dg Chest 2 View  02/03/2011  *RADIOLOGY REPORT*  Clinical Data: Numbness and weakness.  CHEST - 2 VIEW  Comparison: PA and lateral chest 03/26/2008.  Findings: Lungs are clear.  Heart size is normal.  No pneumothorax or effusion.  IMPRESSION: No acute disease.  Original Report Authenticated By: Arvid Right. Luther Parody, M.D.   Ct Head Wo Contrast  02/03/2011  *RADIOLOGY REPORT*  Clinical Data:  Acute onset right-sided upper and lower extremity numbness.  Hypertension.  CT HEAD WITHOUT CONTRAST  Technique: Contiguous axial images were obtained from the base of the skull through the vertex without contrast  Comparison: None  Findings:  There is no evidence of intracranial hemorrhage, brain edema, or other signs of acute infarction.  There is no evidence of intracranial mass lesion or mass effect.  No abnormal extraaxial fluid collections are identified.  There is no evidence of hydrocephalus, or other significant intracranial abnormality.  No skull abnormality identified.  IMPRESSION: Negative non-contrast head CT.  Original Report Authenticated By: Marlaine Hind, M.D.   Mr Brain Wo Contrast  02/07/2011  *RADIOLOGY REPORT*  Clinical Data: Stroke  MRI HEAD WITHOUT CONTRAST  Technique:  Multiplanar, multiecho pulse sequences of the brain and surrounding structures were obtained according  to standard protocol without intravenous contrast.  Comparison: Head CT 12/24  Findings: There is a 5 mm acute infarction within the lateral thalamus on the left.  No other acute infarction.  The brainstem and cerebellum are normal.  The cerebral hemispheres show a few old small vessel insults within the thalami, basal ganglia and deep white matter.  There is encephalomalacia focally within the posterior aspect of the corpus callosum and the adjacent deep white matter consistent with old infarction.  No evidence of mass lesion, old or acute hemorrhage, hydrocephalus or extra-axial collection. No pituitary mass.  No inflammatory sinus disease.  Flow is present in the major vessels at the base of the brain.  Incidental note is made of extensive degenerative disease in the upper cervical spine, not completely evaluated.  IMPRESSION: 5 mm focus of acute infarction affecting the lateral thalamus on the left.  Old small vessel infarctions affecting the thalami, basal ganglia and hemispheric deep white matter.  Encephalomalacia affecting the posterior body of the corpus callosum and the adjacent white matter on both sides consistent with old infarction.  Differential diagnosis for this lesion does include old traumatic injury, though I do not see residual blood products in that area.  Original Report Authenticated By: Jules Schick, M.D.   US Carotid Duplex Bilateral  02/05/2011  *RADIOLOGY REPORT*  Clinical Data: Hypertension, stroke, diabetes, smoker  BILATERAL CAROTID DUPLEX  ULTRASOUND  Technique: Pearline Cables scale imaging, color Doppler and duplex ultrasound was performed of bilateral carotid and vertebral arteries in the neck.  Comparison:  None.  Criteria:  Quantification of carotid stenosis is based on velocity parameters that correlate the residual internal carotid diameter with NASCET-based stenosis levels, using the diameter of the distal internal carotid lumen as the denominator for stenosis measurement.  The  following velocity measurements were obtained:                   PEAK SYSTOLIC/END DIASTOLIC RIGHT ICA:                        75/26cm/sec CCA:                        [id number] SYSTOLIC ICA/CCA RATIO:     [id number] DIASTOLIC ICA/CCA RATIO:    1.65 ECA:                        77cm/sec  LEFT ICA:                        71/23cm/sec CCA:                        [id number] SYSTOLIC ICA/CCA RATIO:     [id number] DIASTOLIC ICA/CCA RATIO:    1.18 ECA:                        96cm/sec  Findings:  RIGHT CAROTID ARTERY: Very minor carotid atherosclerotic changes. No hemodynamically significant right ICA stenosis, velocity elevation, or turbulent flow.  RIGHT VERTEBRAL ARTERY:  Antegrade  LEFT CAROTID ARTERY: Minor diffuse left carotid plaque formation. No hemodynamically significant left ICA stenosis, velocity elevation, or turbulent flow.  LEFT VERTEBRAL ARTERY:  Antegrade  IMPRESSION: Minor carotid atherosclerosis.  No hemodynamically significant ICA stenosis on either side.  Original Report Authenticated By: Jerilynn Mages. Daryll Brod, M.D.   US Venous Img Lower Unilateral Right  02/06/2011  *RADIOLOGY REPORT*  Clinical Data: Right leg numbness  RIGHT LOWER EXTREMITY VENOUS DUPLEX ULTRASOUND  Technique:  Gray-scale sonography with graded compression, as well as color Doppler and duplex ultrasound were performed to evaluate the deep venous system of the lower extremity from the level of the common femoral vein through the popliteal and proximal calf veins. Spectral Doppler was utilized to evaluate flow at rest and with distal augmentation maneuvers.  Comparison:  None.  Findings:  Normal compressibility of the common femoral, superficial femoral, and popliteal veins is demonstrated, as well as the visualized proximal calf veins.  No filling defects to suggest DVT on grayscale or color Doppler imaging.  Doppler waveforms show normal direction of venous flow, normal respiratory phasicity and response to augmentation.  IMPRESSION: No evidence  of lower extremity deep vein thrombosis.  Original Report Authenticated By: Truett Perna, M.D.   US Arterial Seg Single  02/06/2011  *RADIOLOGY REPORT*  Clinical Data: Leg numbness  ULTRASOUND ANKLE/BRACHIAL INDICES BILATERAL  Comparison: None.  Findings: Right and left ankle brachial indices are 0.91 and 0.92 respectively.  Right dorsalis pedis and posterior tibial wave forms are monophasic.  Left posterior tibial and dorsalis pedis wave forms are monophasic.  IMPRESSION: There is severe bilateral lower extremity arterial occlusive disease.  Monophasic wave forms are seen at the ankles bilaterally.  Original Report Authenticated By: Arnell Sieving  Ralene Cork, M.D.    Lab Results: Basic Metabolic Panel:  Basename 02/07/11 0454 02/06/11 0611  NA 134* 135  K 3.6 3.7  CL 98 101  CO2 26 27  GLUCOSE 174* 186*  BUN 13 12  CREATININE 0.89 0.88  CALCIUM 10.0 9.8  MG -- --  PHOS -- --             Hospital Course: This 60 year old man presents to the hospital with right arm and leg numbness. There was no real weakness of the right side but there was clearly numbness. The patient does have a history of hypertension and diabetes. The following day his numbness had improved. Initial CT brain scan did not show any CVA. Bilateral carotid Dopplers were unremarkable for any significant stenosis. He remained in sinus rhythm. He did have an MRI brain scan which showed the left lateral thalamus stroke. This would probably account for his symptoms. In view of his risk factors it was felt important to to focus on disease modification of risk factors. He was strongly encouraged to stop smoking cigarettes and also stop cocaine. He has no primary care physician and is not on any medications which she has now been started on. He will need close followup. His symptoms are virtually resolved except for some residual numbness in the right hand affecting mainly the fingers.  Discharge Exam: Blood pressure 111/79, pulse  90, temperature 97.4 F (36.3 C), temperature source Oral, resp. rate 20, height 6\' 1"  (1.854 m), weight 99.2 kg (218 lb 11.1 oz), SpO2 90.00%. He looks systemically well. He is alert and oriented without any focal neurological signs. Heart sounds are present and normal without murmurs. Lung fields are clear. Abdomen is soft and nontender .  Disposition: Home. He will need to have close follow with the health department as his primary care physicians. We will try to help in with financing some medications. He has been counseled against the use of tobacco or cocaine  Discharge Orders    Future Orders Please Complete By Expires   Diet - low sodium heart healthy      Increase activity slowly         Follow-up Information    Follow up with [facility]. Make an appointment in 1 week.   Contact information:   [address] [place] [phone]          Signed: Doree Albee Pager [phone]  02/07/2011, 9:34 AM

## 2011-02-07 NOTE — Plan of Care (Signed)
Problem: Phase III Progression Outcomes Goal: Pain controlled on oral analgesia Outcome: Progressing Pt has received nothing for pain at this time.

## 2011-02-07 NOTE — Progress Notes (Signed)
  CARE MANAGEMENT NOTE 02/07/2011  Patient:  Cory Weber, Cory Weber   Account Number:  [id number]  Date Initiated:  02/06/2011  Documentation initiated by:  Vladimir Creeks  Subjective/Objective Assessment:   admitted with rt sided weakness, r/o CVA. pt is from home, and will return home. Referred to B. Ratliff, financial counselor for assistance and may need assistance with meds at D/C     Action/Plan:   If therapy needed OP Therapy  here may  be appropriate.   Anticipated DC Date:  02/07/2011   Anticipated DC Plan:  HOME/SELF CARE  In-house referral  Financial Counselor      DC Planning Services  CM consult  Medication Assistance      Choice offered to / List presented to:             Status of service:   Medicare Important Message given?   (If response is "NO", the following Medicare IM given date fields will be blank) Date Medicare IM given:   Date Additional Medicare IM given:    Discharge Disposition:  HOME/SELF CARE  Per UR Regulation:    Comments:  02/06/2011 Remo Lipps rn bsn pt d/c home today medication assistance [email or web address] to follow up with [facility]. 02/06/11 [place]

## 2011-07-31 ENCOUNTER — Emergency Department (HOSPITAL_COMMUNITY)
Admission: EM | Admit: 2011-07-31 | Discharge: 2011-07-31 | Disposition: A | Discharge status: Home / Self Care | Payer: Medicaid Other | Attending: Emergency Medicine | Admitting: Emergency Medicine

## 2011-07-31 ENCOUNTER — Emergency Department (HOSPITAL_COMMUNITY): Payer: Medicaid Other

## 2011-07-31 ENCOUNTER — Encounter (HOSPITAL_COMMUNITY): Payer: Self-pay

## 2011-07-31 DIAGNOSIS — I10 Essential (primary) hypertension: Secondary | ICD-10-CM | POA: Insufficient documentation

## 2011-07-31 DIAGNOSIS — E119 Type 2 diabetes mellitus without complications: Secondary | ICD-10-CM | POA: Insufficient documentation

## 2011-07-31 DIAGNOSIS — R29898 Other symptoms and signs involving the musculoskeletal system: Secondary | ICD-10-CM | POA: Insufficient documentation

## 2011-07-31 DIAGNOSIS — IMO0002 Reserved for concepts with insufficient information to code with codable children: Secondary | ICD-10-CM | POA: Insufficient documentation

## 2011-07-31 DIAGNOSIS — M5416 Radiculopathy, lumbar region: Secondary | ICD-10-CM

## 2011-07-31 DIAGNOSIS — R209 Unspecified disturbances of skin sensation: Secondary | ICD-10-CM | POA: Insufficient documentation

## 2011-07-31 DIAGNOSIS — Z8673 Personal history of transient ischemic attack (TIA), and cerebral infarction without residual deficits: Secondary | ICD-10-CM | POA: Insufficient documentation

## 2011-07-31 LAB — DIFFERENTIAL
Basophils Absolute: 0.1 10*3/uL (ref 0.0–0.1)
Basophils Relative: 1 % (ref 0–1)
Eosinophils Absolute: 0.2 10*3/uL (ref 0.0–0.7)
Eosinophils Relative: 3 % (ref 0–5)
Monocytes Absolute: 0.6 10*3/uL (ref 0.1–1.0)

## 2011-07-31 LAB — URINALYSIS, ROUTINE W REFLEX MICROSCOPIC
Bilirubin Urine: NEGATIVE
Hgb urine dipstick: NEGATIVE
Nitrite: NEGATIVE
Specific Gravity, Urine: 1.02 (ref 1.005–1.030)
pH: 5.5 (ref 5.0–8.0)

## 2011-07-31 LAB — BASIC METABOLIC PANEL
CO2: 28 mEq/L (ref 19–32)
Calcium: 10.4 mg/dL (ref 8.4–10.5)
Creatinine, Ser: 0.97 mg/dL (ref 0.50–1.35)
GFR calc Af Amer: >90 mL/min (ref >90)
GFR calc non Af Amer: 89 mL/min — ABNORMAL LOW (ref >90)
Sodium: 137 mEq/L (ref 135–145)

## 2011-07-31 LAB — CBC
HCT: 36 % — ABNORMAL LOW (ref 39.0–52.0)
MCHC: 32.8 g/dL (ref 30.0–36.0)
MCV: 87.2 fL (ref 78.0–100.0)
Platelets: 300 10*3/uL (ref 150–400)
RDW: 13.3 % (ref 11.5–15.5)

## 2011-07-31 MED ORDER — OXYCODONE-ACETAMINOPHEN 5-325 MG PO TABS: 1.0000 | ORAL_TABLET | ORAL | Status: AC | PRN

## 2011-07-31 MED ORDER — MORPHINE SULFATE 4 MG/ML IJ SOLN
4.0000 mg | Freq: Once | INTRAMUSCULAR | Status: AC
  Administered 2011-07-31: 4 mg via INTRAVENOUS
  Filled 2011-07-31: qty 1

## 2011-07-31 MED ORDER — PREDNISONE 20 MG PO TABS
40.0000 mg | ORAL_TABLET | Freq: Once | ORAL | Status: AC
  Administered 2011-07-31: 40 mg via ORAL
  Filled 2011-07-31: qty 2

## 2011-07-31 MED ORDER — PREDNISONE 5 MG PO TABS: ORAL_TABLET | ORAL | Status: DC

## 2011-07-31 NOTE — ED Provider Notes (Signed)
History  This chart was scribed for [name], MD by Jenne Campus. This patient was seen in room APA01/APA01 and the patient's care was started at 10:04AM.  CSN: [id number]  Arrival date & time 07/31/11  0943   First MD Initiated Contact with Patient 07/31/11 1004      Chief Complaint  Patient presents with  . Numbness  . Back Pain    Patient is a 61 y.o. male presenting with groin pain. The history is provided by the patient. No language interpreter was used.  Groin Pain This is a new problem. The current episode started more than 2 days ago. The problem occurs constantly. The problem has been gradually worsening. The symptoms are aggravated by walking. Nothing relieves the symptoms. He has tried nothing for the symptoms.    Cory Weber is a 61 y.o. male who presents to the Emergency Department complaining of 4 to 5 days of gradual onset, gradually worsening, constant right groin pain described as a burning sensation that radiates into the right knee and the lower back with associated weakness in the right leg. He also states that he has occasional dizziness with turning sharply to the right. The pain is worse with walking. He denies taking OTC medications at home to improve symptoms. He denies having prior episodes of similar symptoms. He reports having a TIA in December 2012 with chronic right forearm numbness and right lower leg numbness but denies changes in those symptoms. He denies being on blood thinners. He denies having any recent falls or injuries. He denies fever, HA, vision changes, abdominal pain, chest pain, neck pain, dysuria, facial numbness, cough and SOB as associated symptoms. He has a h/o DM, HTN, and Hep C. He is a current everyday smoker and occasional alcohol user.  Past Medical History  Diagnosis Date  . Diabetes mellitus   . Hypertension   . Hepatitis C   . TIA (transient ischemic attack)     Past Surgical History  Procedure Date  . Liver  biopsy     Family History  Problem Relation Age of Onset  . Breast cancer Mother   . Diabetes Father   . Hypertension Father   . Diabetes Sister   . Hypertension Sister   . Breast cancer Sister     History  Substance Use Topics  . Smoking status: Current Some Day Smoker -- 1.0 packs/day  . Smokeless tobacco: Not on file  . Alcohol Use: 4.2 oz/week    7 Cans of beer per week     once a week      Review of Systems  A complete 10 system review of systems was obtained and all systems are negative except as noted in the HPI and PMH.   Allergies  Review of patient's allergies indicates no known allergies.  Home Medications   Current Outpatient Rx  Name Route Sig Dispense Refill  . ASPIRIN 325 MG PO TABS Oral Take 1 tablet (325 mg total) by mouth daily. 30 tablet 0  . GLIPIZIDE 2.5 MG HALF TABLET Oral Take 0.5 tablets (2.5 mg total) by mouth 2 (two) times daily before a meal. 60 tablet 0  . HYDROCHLOROTHIAZIDE 25 MG PO TABS Oral Take 1 tablet (25 mg total) by mouth daily. 30 tablet 0  . METFORMIN HCL 500 MG PO TABS Oral Take 1 tablet (500 mg total) by mouth 2 (two) times daily with a meal. 60 tablet 0  . SIMVASTATIN 20 MG PO TABS Oral Take  1 tablet (20 mg total) by mouth daily at 6 PM. 30 tablet 0    Triage Vitals: BP 129/76  Pulse 74  Temp 97.4 F (36.3 C) (Oral)  Resp 20  Ht 6\' 1"  (1.854 m)  Wt 201 lb (91.173 kg)  BMI 26.52 kg/m2  SpO2 100%  Physical Exam  Nursing note and vitals reviewed.  CONSTITUTIONAL: Well developed/well nourished HEAD AND FACE: Normocephalic/atraumatic EYES: EOMI/PERRL ENMT: Mucous membranes moist NECK: supple no meningeal signs SPINE: lumbar paraspinal tenderness, No bruising/crepitance/stepoffs noted to spine No midline tenderness CV: S1/S2 noted, no murmurs/rubs/gallops noted LUNGS: Lungs are clear to auscultation bilaterally, no apparent distress ABDOMEN: soft, nontender, no rebound or guarding GU:no cva tenderness No scrotal  tenderness/erythema noted, no hernia noted NEURO: Pt is awake/alert He has mild weakness with right hip flexion, but no other focal motor deficits in his lower extremities.  He reports numbness along thigh to his right knee.  knee flexion/extension, ankle dorsi/plantar flexion, great toe extension intact bilaterally, no clonus bilaterally, plantar reflex appropriate,   Equal patellar/achilles reflex noted.  Pt is able to ambulate. He reports numbness to right forearm (chronic) EXTREMITIES: pulses normal, full ROM.  Distals pulses intact on bilateral LE SKIN: warm, color normal PSYCH: no abnormalities of mood noted  ED Course  Procedures   DIAGNOSTIC STUDIES: Oxygen Saturation is 100% on room air, normal by my interpretation.    COORDINATION OF CARE: 10:16AM-Discussed treatment plan of pain medications, blood work and MRI of back with pt and pt agreed to plan. Pt difficult historian, but it seems new complaint is his right LE weakness.  He reports numbness to extremities are at baseline.  He has no abdominal pain, doubt AAA.  Given new back pain with leg weakness, it is possible this is lumbar radiculopathy.  He has distal pulses so I doubt arterial occlusion.   Will obtain MRI 1:27 PM Mri results noted D/w dr Sherwood Gambler, and advised him that patient's pain is under control, he is ambulatory Requests I start short course of steroids, pain control and will see in office next week He reviewed mri    MDM  Nursing notes including past medical history and social history reviewed and considered in documentation All labs/vitals reviewed and considered Previous records reviewed and considered - previous admissions for CVA, had residual numbness to right UE only     Date: 07/31/2011  Rate: 63  Rhythm: normal sinus rhythm  QRS Axis: normal  Intervals: normal  ST/T Wave abnormalities: normal  Conduction Disutrbances:none  Narrative Interpretation:   Old EKG Reviewed: unchanged     I  personally performed the services described in this documentation, which was scribed in my presence. The recorded information has been reviewed and considered.          [name], MD 07/31/11 1328

## 2011-07-31 NOTE — Discharge Instructions (Signed)
Be sure to check your blood sugar frequently as this medicine will raise your blood sugar  Lumbosacral Radiculopathy Lumbosacral radiculopathy is a pinched nerve or nerves in the low back (lumbosacral area). When this happens you may have weakness in your legs and may not be able to stand on your toes. You may have pain going down into your legs. There may be difficulties with walking normally. There are many causes of this problem. Sometimes this may happen from an injury, or simply from arthritis or boney problems. It may also be caused by other illnesses such as diabetes. If there is no improvement after treatment, further studies may be done to find the exact cause. DIAGNOSIS  X-rays may be needed if the problems become long standing. Electromyograms may be done. This study is one in which the working of nerves and muscles is studied. HOME CARE INSTRUCTIONS   Applications of ice packs may be helpful. Ice can be used in a plastic bag with a towel around it to prevent frostbite to skin. This may be used every 2 hours for 20 to 30 minutes, or as needed, while awake, or as directed by your caregiver.   Only take over-the-counter or prescription medicines for pain, discomfort, or fever as directed by your caregiver.   If physical therapy was prescribed, follow your caregiver's directions.  SEEK IMMEDIATE MEDICAL CARE IF:   You have pain not controlled with medications.   You seem to be getting worse rather than better.   You develop increasing weakness in your legs.   You develop loss of bowel or bladder control.   You have difficulty with walking or balance, or develop clumsiness in the use of your legs.   You have a fever.  MAKE SURE YOU:   Understand these instructions.   Will watch your condition.   Will get help right away if you are not doing well or get worse.  Document Released: 01/28/2005 Document Revised: 01/17/2011 Document Reviewed: 09/18/2007 [facility] Patient  Information 2012 [employer].

## 2011-07-31 NOTE — ED Notes (Signed)
Pt c/o pain in lower back since last week.  Now says r arm and r leg feel numb.

## 2011-07-31 NOTE — ED Notes (Signed)
Coke and crackers given to pt.ok to feed pt per Dr. Christy Gentles.  Resting in bed with nad noted.

## 2011-10-31 LAB — CBC
ALT: 21 U/L (ref 10–40)
AST: 26 U/L
Glucose: 118
Hepatitis C Ab: POSITIVE
MCV: 82.8 fL
Potassium: 4 mmol/L
Total Bilirubin: 0.3 mg/dL
platelet count: 335

## 2011-12-03 ENCOUNTER — Ambulatory Visit (INDEPENDENT_AMBULATORY_CARE_PROVIDER_SITE_OTHER): Payer: Medicaid Other | Admitting: Gastroenterology

## 2011-12-03 ENCOUNTER — Encounter: Payer: Self-pay | Admitting: Gastroenterology

## 2011-12-03 VITALS — BP 156/79 | HR 59 | Temp 96.2°F | Ht 73.0 in | Wt 200.6 lb

## 2011-12-03 DIAGNOSIS — D649 Anemia, unspecified: Secondary | ICD-10-CM

## 2011-12-03 DIAGNOSIS — R894 Abnormal immunological findings in specimens from other organs, systems and tissues: Secondary | ICD-10-CM

## 2011-12-03 DIAGNOSIS — K921 Melena: Secondary | ICD-10-CM

## 2011-12-03 DIAGNOSIS — R768 Other specified abnormal immunological findings in serum: Secondary | ICD-10-CM

## 2011-12-03 MED ORDER — PEG 3350-KCL-NA BICARB-NACL 420 G PO SOLR: 4000.0000 mL | ORAL | Status: DC

## 2011-12-03 NOTE — Patient Instructions (Addendum)
Please have your blood work done as soon as possible. Please have your ultrasound done as scheduled. We have scheduled you for a colonoscopy with possible upper endoscopy to further evaluate your anemia. Please see separate instructions. Day of bowel prep: take 1/2 dose of glipizide and metformin. We will request a copy of your liver biopsy for review.  Hepatitis C Hepatitis C is a viral infection of the liver. Infection may go undetected for months or years because symptoms may be absent or very mild. Chronic liver disease is the main danger of hepatitis C. This may lead to scarring of the liver (cirrhosis), liver failure, and liver cancer. CAUSES  Hepatitis C is caused by the hepatitis C virus (HCV). Formerly, hepatitis C infections were most commonly transmitted through blood transfusions. In the early 1990s, routine testing of donated blood for hepatitis C and exclusion of blood that tests positive for HCV began. Now, HCV is most commonly transmitted from person to person through injection drug use, sharing needles, or sex with an infected person. A caregiver may also get the infection from exposure to the blood of an infected patient by way of a cut or needle stick.  SYMPTOMS  Acute Phase Many cases of acute HCV infection are mild and cause few problems.Some people may not even realize they are sick.Symptoms in others may last a few weeks to several months and include:  Feeling very tired.  Loss of appetite.  Nausea.  Vomiting.  Abdominal pain.  Dark yellow urine.  Yellow skin and eyes (jaundice).  Itching of the skin. Chronic Phase  Between 50% to 85% of people who get HCV infection become "chronic carriers." They often have no symptoms, but the virus stays in their body.They may spread the virus to others and can get long-term liver disease.  Many people with chronic HCV infection remain healthy for many years. However, up to 1 in 5 chronically infected people may develop  severe liver diseases including scarring of the liver (cirrhosis), liver failure, or liver cancer. DIAGNOSIS  Diagnosis of hepatitis C infection is made by testing blood for the presence of hepatitis C viral particles called RNA. Other tests may also be done to measure the status of current liver function, exclude other liver problems, or assess liver damage. TREATMENT  Treatment with many antiviral drugs is available and recommended for some patients with chronic HCV infection. Drug treatment is generally considered appropriate for patients who:  Are 3 years of age or older.  Have a positive test for HCV particles in the blood.  Have a liver tissue sample (biopsy) that shows chronic hepatitis and significant scarring (fibrosis).  Do not have signs of liver failure.  Have acceptable blood test results that confirm the wellness of other body organs.  Are willing to be treated and conform to treatment requirements.  Have no other circumstances that would prevent treatment from being recommended (contraindications). All people who are offered and choose to receive drug treatment must understand that careful medical follow up for many months and even years is crucial in order to make successful care possible. The goal of drug treatment is to eliminate any evidence of HCV in the blood on a long-term basis. This is called a "sustained virologic response" or SVR. Achieving a SVR is associated with a decrease in the chance of life-threatening liver problems, need for a liver transplant, liver cancer rates, and liver-related complications. Successful treatment currently requires taking treatment drugs for at least 24 weeks and up to  72 weeks. An injected drug (interferon) given weekly and an oral antiviral medicine taken daily are usually prescribed. Side effects from these drugs are common and some may be very serious. Your response to treatment must be carefully monitored by both you and your caregiver  throughout the entire treatment period. PREVENTION There is no vaccine for hepatitis C. The only way to prevent the disease is to reduce the risk of exposure to the virus.   Avoid sharing drug needles or personal items like toothbrushes, razors, and nail clippers with an infected person.  Healthcare workers need to avoid injuries and wear appropriate protective equipment such as gloves, gowns, and face masks when performing invasive medical or nursing procedures. HOME CARE INSTRUCTIONS  To avoid making your liver disease worse:  Strictly avoid drinking alcohol.  Carefully review all new prescriptions of medicines with your caregiver. Ask your caregiver which drugs you should avoid. The following drugs are toxic to the liver, and your caregiver may tell you to avoid them:  Isoniazid.  Methyldopa.  Acetaminophen.  Anabolic steroids (muscle-building drugs).  Erythromycin.  Oral contraceptives (birth control pills).  Check with your caregiver to make sure medicine you are currently taking will not be harmful.  Periodic blood tests may be required. Follow your caregiver's advice about when you should have blood tests.  Avoid a sexual relationship until advised otherwise by your caregiver.  Avoid activities that could expose other people to your blood. Examples include sharing a toothbrush, nail clippers, razors, and needles.  Bed rest is not necessary, but it may make you feel better. Recovery time is not related to the amount of rest you receive.  This infection is contagious. Follow your caregiver's instructions in order to avoid spread of the infection. SEEK IMMEDIATE MEDICAL CARE IF:  You have increasing fatigue or weakness.  You have an oral temperature above 102 F (38.9 C), not controlled by medicine.  You develop loss of appetite, nausea, or vomiting.  You develop jaundice.  You develop easy bruising or bleeding.  You develop any severe problems as a result of  your treatment. MAKE SURE YOU:   Understand these instructions.  Will watch your condition.  Will get help right away if you are not doing well or get worse. Document Released: 01/26/2000 Document Revised: 04/22/2011 Document Reviewed: 05/30/2010 [facility] Patient Information 2013 [employer].

## 2011-12-03 NOTE — Assessment & Plan Note (Signed)
New anemia, normocytic since hospitalized in 01/2011. No prior colonoscopy or EGD. Offered TCS +/- EGD for further evaluation of anemia, reported melena. Phenergan 25mg  IV 30 mins prior to procedure to augment conscious sedation.  I have discussed the risks, alternatives, benefits with regards to but not limited to the risk of reaction to medication, bleeding, infection, perforation and the patient is agreeable to proceed. Written consent to be obtained.

## 2011-12-03 NOTE — Progress Notes (Signed)
Faxed to PCP

## 2011-12-03 NOTE — Assessment & Plan Note (Signed)
Patient reports being diagnosed with HCV while living in [place], [place] around 2007. Reports having liver bx done. No treatment offered probably due to his polysubstance abuse at the time. We need to collect data including further labs and prior liver bx report. Discussed chronic HCV and handout provided. Obtain baseline AFP and abd u/s.

## 2011-12-03 NOTE — Progress Notes (Signed)
Primary Care Physician:  Maggie Font, MD  Primary Gastroenterologist:  Garfield Cornea, MD  Chief Complaint  Patient presents with  . Referral    Hepatitis    HPI:  Cory Weber is a 61 y.o. male here for further evaluation of Hepatitis C at request of Dr. Iona Beard.  Patient reports he was diagnosed with hepatitis C while living in [place], [place] around 2006/2007. He reports that he was never offered treatment. He does have h/o cocaine and marijuana abuse which is likely the reason for no treatment being offered. He denies h/o etoh abuse. He denies street drug use since 01/2011 when he was hospitalized with stroke. He denies h/o blood transfusion or tatoos. He reports h/o numerous treatments for STDs and wonders if contracted HCV sexually.    No prior EGD/TCS. DM diagnosed about 2006. No heartburn, dysphagia, vomiting. Some vague upper abdominal pain with constipation. Sometimes takes laxative. Takes infrequent. No brbpr. Sometimes black stool but not recently. Denies Pepto-Bismol use.  No weight loss. Good appetite, fruits/veggies/meat.   Current Outpatient Prescriptions  Medication Sig Dispense Refill  . aspirin 325 MG tablet Take 325 mg by mouth daily.      Marland Kitchen glipiZIDE (GLUCOTROL) 2.5 mg TABS Take 5 mg by mouth 2 (two) times daily before a meal.       . lisinopril-hydrochlorothiazide (PRINZIDE,ZESTORETIC) 20-12.5 MG per tablet Take 1 tablet by mouth 2 (two) times daily.      . metFORMIN (GLUCOPHAGE) 500 MG tablet Take 500 mg by mouth 2 (two) times daily with a meal.      . simvastatin (ZOCOR) 20 MG tablet Take 20 mg by mouth daily at 6 PM.        Allergies as of 12/03/2011  . (No Known Allergies)    Past Medical History  Diagnosis Date  . Diabetes mellitus   . Hypertension   . Hepatitis C     states he was diagnosed in 2007 or 2007 while living in [place], [place]  . CVA (cerebral infarction) 01/2011  . Hyperlipidemia   . Asthma   . PVD (peripheral vascular disease)      Past Surgical History  Procedure Date  . Liver biopsy     Done in [place], [place], approximately 2008    Family History  Problem Relation Age of Onset  . Breast cancer Mother     deceased  . Diabetes Father   . Hypertension Father   . Diabetes Sister   . Hypertension Sister   . Breast cancer Sister   . Heart disease Father     deceased  . Colon cancer Neg Hx   . Liver disease Neg Hx     History   Social History  . Marital Status: Single    Spouse Name: N/A    Number of Children: 1  . Years of Education: N/A   Occupational History  . trying to get disability    Social History Main Topics  . Smoking status: Current Some Day Smoker -- 1.0 packs/day  . Smokeless tobacco: Not on file   Comment: smokes 2-3 cigatettes daily  . Alcohol Use: 0.0 oz/week     Drinks beer occasionally, once or twice a month. Never heavy.  . Drug Use: Yes    Special: Cocaine, Marijuana     last used december  . Sexually Active: Not on file   Other Topics Concern  . Not on file   Social History Narrative  . No narrative on file  ROS:  General: Negative for anorexia, weight loss, fever, chills, fatigue, weakness. Eyes: Negative for vision changes.  ENT: Negative for hoarseness, difficulty swallowing , nasal congestion. CV: Negative for chest pain, angina, palpitations, dyspnea on exertion, peripheral edema.  Respiratory: Negative for dyspnea at rest, dyspnea on exertion, cough, sputum, wheezing.  GI: See history of present illness. GU:  Negative for dysuria, hematuria, urinary incontinence, urinary frequency, nocturnal urination.  MS: C/O leg joint pain. No low back pain.  Derm: Negative for rash or itching.  Neuro: Negative for weakness, abnormal sensation, seizure, frequent headaches, memory loss, confusion.  Psych: Negative for anxiety, depression, suicidal ideation, hallucinations.  Endo: Negative for unusual weight change.  Heme: Negative for bruising or  bleeding. Allergy: Negative for rash or hives.    Physical Examination:  BP 156/79  Pulse 59  Temp 96.2 F (35.7 C) (Tympanic)  Ht 6\' 1"  (1.854 m)  Wt 200 lb 9.6 oz (90.992 kg)  BMI 26.47 kg/m2   General: Well-nourished, well-developed in no acute distress.  Head: Normocephalic, atraumatic.   Eyes: Conjunctiva pink, no icterus. Mouth: Oropharyngeal mucosa moist and pink , no lesions erythema or exudate. Neck: Supple without thyromegaly, masses, or lymphadenopathy.  Lungs: Clear to auscultation bilaterally.  Heart: Regular rate and rhythm, no murmurs rubs or gallops.  Abdomen: Bowel sounds are normal, nontender, nondistended, no hepatosplenomegaly or masses, no abdominal bruits or    hernia , no rebound or guarding.   Rectal: deferred. Extremities: No lower extremity edema. No clubbing or deformities.  Neuro: Alert and oriented x 4 , grossly normal neurologically.  Skin: Warm and dry, no rash or jaundice.   Psych: Alert and cooperative, normal mood and affect.  Labs: Labs from 10/31/2011. White blood cell count 8800, hemoglobin 10.4, hematocrit 31.8, MCV 82.8, platelets 335,000, sodium 140, potassium 4, glucose 118, BUN 10, creatinine 0.99, calcium 9, hepatitis B surface antigen negative, hepatitis B core IgM negative, hepatitis A IgM negative, hepatitis C antibody reactive, total bilirubin 0.3, alkaline phosphatase 51, AST 26, ALT 21, albumin 4.  Imaging Studies: No results found.

## 2011-12-04 ENCOUNTER — Encounter: Payer: Self-pay | Admitting: Gastroenterology

## 2011-12-04 NOTE — Progress Notes (Signed)
Received copy of liver biopsy results from 01/2004. He had chronic hepatitis with mild periportal inflammation (mild chronic active hepatitis), lobular unicellular necrosis and portal fibrosis. Grade 2, stage 1-2.

## 2011-12-10 ENCOUNTER — Ambulatory Visit (HOSPITAL_COMMUNITY): Payer: Medicaid Other | Attending: Gastroenterology

## 2011-12-20 ENCOUNTER — Encounter (HOSPITAL_COMMUNITY): Payer: Self-pay | Admitting: Pharmacy Technician

## 2011-12-23 ENCOUNTER — Telehealth: Payer: Self-pay | Admitting: Gastroenterology

## 2011-12-23 NOTE — Telephone Encounter (Signed)
Patient called and cancelled his procedure due to a death in the family and he will call back to R/S at a later time

## 2011-12-25 ENCOUNTER — Encounter (HOSPITAL_COMMUNITY): Admission: RE | Payer: Self-pay | Source: Ambulatory Visit

## 2011-12-25 ENCOUNTER — Ambulatory Visit (HOSPITAL_COMMUNITY): Admission: RE | Admit: 2011-12-25 | Payer: Medicaid Other | Source: Ambulatory Visit | Admitting: Internal Medicine

## 2011-12-25 SURGERY — COLONOSCOPY
Anesthesia: Moderate Sedation

## 2011-12-26 ENCOUNTER — Other Ambulatory Visit: Payer: Self-pay | Admitting: *Deleted

## 2011-12-26 DIAGNOSIS — I739 Peripheral vascular disease, unspecified: Secondary | ICD-10-CM

## 2011-12-27 ENCOUNTER — Telehealth: Payer: Self-pay | Admitting: Gastroenterology

## 2011-12-27 NOTE — Telephone Encounter (Signed)
Please remind patient to have blood work done that was ordered at his OV last month.

## 2011-12-30 NOTE — Telephone Encounter (Signed)
Mailed reminder letter to pt. 

## 2012-01-20 ENCOUNTER — Encounter: Payer: Self-pay | Admitting: Vascular Surgery

## 2012-01-21 ENCOUNTER — Encounter: Payer: Self-pay | Admitting: Vascular Surgery

## 2012-01-21 ENCOUNTER — Ambulatory Visit (INDEPENDENT_AMBULATORY_CARE_PROVIDER_SITE_OTHER): Payer: Medicaid Other | Admitting: Vascular Surgery

## 2012-01-21 ENCOUNTER — Encounter (INDEPENDENT_AMBULATORY_CARE_PROVIDER_SITE_OTHER): Payer: Medicaid Other | Admitting: *Deleted

## 2012-01-21 VITALS — BP 139/76 | HR 77 | Resp 16 | Ht 73.0 in | Wt 199.2 lb

## 2012-01-21 DIAGNOSIS — I739 Peripheral vascular disease, unspecified: Secondary | ICD-10-CM

## 2012-01-21 NOTE — Progress Notes (Signed)
Subjective:     Patient ID: Cory Weber, male   DOB: 25-Sep-1950, 61 y.o.   MRN: [id number]  HPI this 61 year old male was referred for possible vascular occlusive disease. He has been having what he describes as a burning sensation along the medial aspect of his right leg beginning in the groin and extending down the medial aspect of his leg to the foot. This has been going on for about one year. He suffered a left brain stroke about one year ago which resulted in some right-sided numbness. He denies classic claudication symptoms although he does get fatigued when he ambulates and is not ambulate further than one block. He has no history of nonhealing ulcers, infection, gangrene, or rest pain. If he gets off his feet and elevates the legs he gets some improvement from his symptoms.  Past Medical History  Diagnosis Date  . Diabetes mellitus   . Hypertension   . Hepatitis C     states he was diagnosed in 2007 or 2007 while living in [place], [place]  . CVA (cerebral infarction) 01/2011  . Hyperlipidemia   . Asthma   . PVD (peripheral vascular disease)   . Anemia   . Stroke     History  Substance Use Topics  . Smoking status: Current Some Day Smoker -- 0.5 packs/day  . Smokeless tobacco: Never Used     Comment: given 1-800-Quit Now  . Alcohol Use: 0.0 oz/week     Comment: Drinks beer occasionally, once or twice a month. Never heavy.    Family History  Problem Relation Age of Onset  . Breast cancer Mother     deceased  . Cancer Mother   . Diabetes Father   . Hypertension Father   . Heart disease Father     deceased  . Hyperlipidemia Father   . Diabetes Sister   . Hypertension Sister   . Breast cancer Sister   . Colon cancer Neg Hx   . Liver disease Neg Hx     No Known Allergies  Current outpatient prescriptions:aspirin 325 MG tablet, Take 325 mg by mouth daily., Disp: , Rfl: ;  glipiZIDE (GLUCOTROL) 2.5 mg TABS, Take 5 mg by mouth 2 (two) times daily before a meal. ,  Disp: , Rfl: ;  lisinopril-hydrochlorothiazide (PRINZIDE,ZESTORETIC) 20-12.5 MG per tablet, Take 1 tablet by mouth 2 (two) times daily., Disp: , Rfl: ;  metFORMIN (GLUCOPHAGE) 500 MG tablet, Take 500 mg by mouth 2 (two) times daily with a meal., Disp: , Rfl:  polyethylene glycol-electrolytes (TRILYTE) 420 G solution, Take 4,000 mLs by mouth as directed., Disp: 4000 mL, Rfl: 0;  simvastatin (ZOCOR) 20 MG tablet, Take 20 mg by mouth daily at 6 PM., Disp: , Rfl:   BP 139/76  Pulse 77  Resp 16  Ht 6\' 1"  (1.854 m)  Wt 199 lb 3.2 oz (90.357 kg)  BMI 26.28 kg/m2  SpO2 100%  Body mass index is 26.28 kg/(m^2).           Review of Systems denies chest pain, but does complain of mild dyspnea on exertion. No PND or orthopnea. Denies hemoptysis. Smokes pack of cigarettes per day for the past 40+ years.     Objective:   Physical Exam blood pressure 139/76 heart rate 77 respirations 16 Gen.-alert and oriented x3 in no apparent distress HEENT normal for age Lungs no rhonchi or wheezing Cardiovascular regular rhythm no murmurs carotid pulses 3+ palpable no bruits audible Abdomen soft nontender no palpable masses  Musculoskeletal free of  major deformities Skin clear -no rashes Neurologic normal Lower extremities 3+ femoral and 1-2+ popliteal pulses palpable bilaterally. No distal pulses palpable in either foot. Both feet are warm and well-perfused. No evidence of ischemia, gangrene, or infection  Today I ordered lower extremity ABIs which are reviewed and interpreted. On the left is 0.97 on the right is 0.96 but there is monophasic flow in the feet is suggestive of calcified tibial vessels.     Assessment:     Burning discomfort right lower extremity felt to be due to either old left brain CVA or some nerve compression syndrome-not vascular etiology Chronic tibial occlusive disease-asymptomatic at present    Plan:     No further vascular workup indicated Suspect symptoms are due to left  brain CVA 1 year ago which is when symptoms started Could have nerve compression syndrome less likely Return to see [place] on a when necessary basis

## 2012-04-14 ENCOUNTER — Ambulatory Visit: Payer: Medicaid Other | Admitting: Urgent Care

## 2012-04-14 ENCOUNTER — Telehealth: Payer: Self-pay | Admitting: Urgent Care

## 2012-04-14 NOTE — Telephone Encounter (Signed)
noted 

## 2012-04-14 NOTE — Telephone Encounter (Signed)
Pt was a no show

## 2012-04-24 ENCOUNTER — Encounter: Payer: Self-pay | Admitting: Gastroenterology

## 2012-05-06 ENCOUNTER — Ambulatory Visit: Payer: Medicaid Other | Admitting: Gastroenterology

## 2012-05-12 ENCOUNTER — Encounter: Payer: Self-pay | Admitting: Gastroenterology

## 2012-05-12 ENCOUNTER — Ambulatory Visit (INDEPENDENT_AMBULATORY_CARE_PROVIDER_SITE_OTHER): Payer: Medicaid Other | Admitting: Gastroenterology

## 2012-05-12 VITALS — BP 144/78 | HR 92 | Temp 98.4°F | Ht 73.0 in | Wt 209.2 lb

## 2012-05-12 DIAGNOSIS — K5900 Constipation, unspecified: Secondary | ICD-10-CM

## 2012-05-12 DIAGNOSIS — B1920 Unspecified viral hepatitis C without hepatic coma: Secondary | ICD-10-CM

## 2012-05-12 DIAGNOSIS — D649 Anemia, unspecified: Secondary | ICD-10-CM

## 2012-05-12 NOTE — Patient Instructions (Addendum)
Please have blood work and ultrasound completed.  We will be in touch shortly with further recommendations.

## 2012-05-12 NOTE — Progress Notes (Signed)
Referring Provider: Iona Beard, MD Primary Care Physician:  Maggie Font, MD Primary GI: Dr. Gala Romney    Chief Complaint  Patient presents with  . Hepatitis    HPI:   Cory Weber is a pleasant 62 year old male with a history of Hepatitis C, referred again by Dr. Berdine Addison for further evaluation. He was seen by our practice in Oct 2013 and was to have a colonoscopy with possible EGD. However, this was cancelled due to a death in the family. [place] of abdomen and blood work not completed as ordered.   Patient reports he was diagnosed with hepatitis C while living in [place], [place] around 2006/2007. He reports that he was never offered treatment. He does have h/o cocaine and marijuana abuse which is likely the reason for no treatment being offered. He denies h/o etoh abuse. States using cocaine about a month ago, smoking.  He denies h/o blood transfusion or tattoos. He reports h/o numerous treatments for STDs and wonders if contracted HCV sexually.   Denies abdominal pain, +constipation. Chronic. No rectal bleeding. No melena. No weight loss or lack of appetite. No jaundice, pruritis, memory loss, confusion.    Past Medical History  Diagnosis Date  . Diabetes mellitus   . Hypertension   . Hepatitis C     states he was diagnosed in 2007 or 2007 while living in [place], [place]  . CVA (cerebral infarction) 01/2011  . Hyperlipidemia   . Asthma   . PVD (peripheral vascular disease)   . Anemia   . Stroke     Past Surgical History  Procedure Laterality Date  . Liver biopsy  2005    Done in [place], [place]. Chronic hepatitis with mild periportal inflammation, lobular unicellular necrosis and portal fibrosis. Grade 2, stage 1-2.    Current Outpatient Prescriptions  Medication Sig Dispense Refill  . glipiZIDE (GLUCOTROL) 2.5 mg TABS Take 5 mg by mouth 2 (two) times daily before a meal.       . lisinopril-hydrochlorothiazide (PRINZIDE,ZESTORETIC) 20-12.5 MG per tablet Take 1 tablet by mouth 2 (two)  times daily.      . metFORMIN (GLUCOPHAGE) 500 MG tablet Take 500 mg by mouth 2 (two) times daily with a meal.      . simvastatin (ZOCOR) 20 MG tablet Take 20 mg by mouth daily at 6 PM.      . polyethylene glycol-electrolytes (TRILYTE) 420 G solution Take 4,000 mLs by mouth as directed.  4000 mL  0   No current facility-administered medications for this visit.    Allergies as of 05/12/2012  . (No Known Allergies)    Family History  Problem Relation Age of Onset  . Breast cancer Mother     deceased  . Cancer Mother   . Diabetes Father   . Hypertension Father   . Heart disease Father     deceased  . Hyperlipidemia Father   . Diabetes Sister   . Hypertension Sister   . Breast cancer Sister   . Colon cancer Neg Hx   . Liver disease Neg Hx     History   Social History  . Marital Status: Single    Spouse Name: N/A    Number of Children: 1  . Years of Education: N/A   Occupational History  . trying to get disability    Social History Main Topics  . Smoking status: Current Some Day Smoker -- 0.50 packs/day  . Smokeless tobacco: Never Used     Comment: given 1-800-Quit Now  .  Alcohol Use: 0.0 oz/week     Comment: Drinks beer occasionally, once or twice a month. Never heavy.  . Drug Use: Yes    Special: Cocaine, Marijuana     Comment: last cocaine about a month ago, no marijuana for "a long time"   . Sexually Active: None   Other Topics Concern  . None   Social History Narrative  . None    Review of Systems: Gen: Denies fever, chills, anorexia. Denies fatigue, weakness, weight loss.  CV: Denies chest pain, palpitations, syncope, peripheral edema, and claudication. Resp: Denies dyspnea at rest, cough, wheezing, coughing up blood, and pleurisy. GI: Denies vomiting blood, jaundice, and fecal incontinence.   Denies dysphagia or odynophagia. Derm: Denies rash, itching, dry skin Psych: Denies depression, anxiety, memory loss, confusion. No homicidal or suicidal  ideation.  Heme: Denies bruising, bleeding, and enlarged lymph nodes.  Physical Exam: BP 144/78  Pulse 92  Temp(Src) 98.4 F (36.9 C) (Oral)  Ht 6\' 1"  (1.854 m)  Wt 209 lb 3.2 oz (94.892 kg)  BMI 27.61 kg/m2 General:   Alert and oriented. No distress noted. Pleasant and cooperative.  Head:  Normocephalic and atraumatic. Eyes:  Conjuctiva clear without scleral icterus. Mouth:  Oral mucosa pink and moist. Edentulous.  Neck:  Supple, without mass or thyromegaly. Heart:  S1, S2 present without murmurs, rubs, or gallops. Regular rate and rhythm. Abdomen:  +BS, soft, non-tender and non-distended. No rebound or guarding. No HSM or masses noted. Msk:  Symmetrical without gross deformities. Normal posture. Extremities:  Without edema. Neurologic:  Alert and  oriented x4;  grossly normal neurologically. Skin:  Intact without significant lesions or rashes. Cervical Nodes:  No significant cervical adenopathy. Psych:  Alert and cooperative. Normal mood and affect.

## 2012-05-13 DIAGNOSIS — B182 Chronic viral hepatitis C: Secondary | ICD-10-CM | POA: Insufficient documentation

## 2012-05-13 DIAGNOSIS — K5900 Constipation, unspecified: Secondary | ICD-10-CM | POA: Insufficient documentation

## 2012-05-13 NOTE — Assessment & Plan Note (Signed)
62 year old African-American male with history of Hepatitis C, no treatment in past, notes current marijuana use and cocaine about a month ago. Asymptomatic currently. Needs to have further blood work, [place] of abdomen, and vaccinations updated if necessary. Discussed treatment options and the fact he would not be a candidate if he was actively using drugs. He states understanding regarding this. He needs an initial screening colonoscopy, possible upper endoscopy if +cirrhosis on [place] of abdomen. Will need to discuss with Dr. Gala Romney type of sedation.   Labs as outlined [place] of abdomen Further recommendations to follow TCS in near future, +/- EGD with Dr. Gala Romney. The risks, benefits, alternatives were discussed in detail, and he is willing to proceed.

## 2012-05-13 NOTE — Assessment & Plan Note (Signed)
No rectal bleeding, may be multifactorial. Check iron, ferritin today. TCS +/- EGD in near future.

## 2012-05-13 NOTE — Assessment & Plan Note (Signed)
Chronic. Miralax daily prn. May benefit from [place] or [place].

## 2012-05-13 NOTE — Progress Notes (Signed)
CC PCP 

## 2012-05-19 ENCOUNTER — Ambulatory Visit (HOSPITAL_COMMUNITY)
Admission: RE | Admit: 2012-05-19 | Discharge: 2012-05-19 | Disposition: A | Discharge status: Home / Self Care | Payer: Medicaid Other | Source: Ambulatory Visit | Attending: Gastroenterology | Admitting: Gastroenterology

## 2012-05-19 DIAGNOSIS — B1920 Unspecified viral hepatitis C without hepatic coma: Secondary | ICD-10-CM | POA: Insufficient documentation

## 2012-05-21 ENCOUNTER — Other Ambulatory Visit: Payer: Self-pay

## 2012-05-21 ENCOUNTER — Other Ambulatory Visit: Payer: Self-pay | Admitting: Gastroenterology

## 2012-05-21 DIAGNOSIS — D509 Iron deficiency anemia, unspecified: Secondary | ICD-10-CM

## 2012-05-21 LAB — CBC WITH DIFFERENTIAL/PLATELET
Basophils Relative: 1 % (ref 0–1)
Eosinophils Absolute: 0.2 10*3/uL (ref 0.0–0.7)
Eosinophils Relative: 3 % (ref 0–5)
Hemoglobin: 8.4 g/dL — ABNORMAL LOW (ref 13.0–17.0)
MCH: 21.7 pg — ABNORMAL LOW (ref 26.0–34.0)
MCHC: 29.4 g/dL — ABNORMAL LOW (ref 30.0–36.0)
MCV: 73.9 fL — ABNORMAL LOW (ref 78.0–100.0)
Monocytes Relative: 8 % (ref 3–12)
Neutrophils Relative %: 62 % (ref 43–77)

## 2012-05-21 LAB — FERRITIN: Ferritin: 4 ng/mL — ABNORMAL LOW (ref 22–322)

## 2012-05-21 LAB — HEPATITIS C RNA QUANTITATIVE: HCV Quantitative: 11573712 IU/mL — ABNORMAL HIGH (ref <15)

## 2012-05-21 NOTE — Progress Notes (Signed)
Quick Note:  [place] normal.  ______

## 2012-05-21 NOTE — Progress Notes (Signed)
Quick Note:  Significant drop in Hgb since Sept 2013, 2 grams.  Iron and ferritin both low, +IDA.  Looks like he is immune to Hep A and B We are still awaiting Hep C RNA. [place] of abdomen was normal.  Patient has no hematochezia. I spoke with him personally, and he states he chews a lot of ice. He states his stools have been hard and "tarry" for "awhile". However, he denied this at the recent appointment when I saw him. This is in the setting of chronic constipation. He needs a TCS and EGD, now that there is evidence of IDA. ALSO, we need his hemoccult status. I reviewed symptoms/signs that would necessitate urgent attention. He will do a drug test on Tuesday. He denies any recent cocaine use and knows this will only prolong the work-up process.    1. Please fax the urine drug screen orders I have printed to the lab. He is going on Tuesdsay.  2. Please have him do an ifobt. He knows he needs to do this. ______

## 2012-05-28 NOTE — Progress Notes (Signed)
Quick Note:  Please remind patient he needs to complete urine drug screen.  This is extremely important.  I want him to have his procedures as soon as possible, but we can not proceed with TCS/EGD if he is +cocaine, barring an emergent need. ______

## 2012-06-01 NOTE — Progress Notes (Signed)
Quick Note:  Called pt- he said he would do the UDS and the ifobt this week. ______

## 2012-06-09 NOTE — Progress Notes (Signed)
Quick Note:  I attempted to call patient again. He has still not followed through on our recommendations.  Needs to complete urine drug screen, ifobt. Once clear drug screen, we can set up for TCS/EGD.  Please send letter to call us ASAP. ______

## 2012-06-09 NOTE — Progress Notes (Signed)
Quick Note:  Quarry manager. ______

## 2012-06-12 ENCOUNTER — Ambulatory Visit (INDEPENDENT_AMBULATORY_CARE_PROVIDER_SITE_OTHER): Payer: Medicaid Other | Admitting: Gastroenterology

## 2012-06-12 DIAGNOSIS — K5900 Constipation, unspecified: Secondary | ICD-10-CM

## 2012-06-12 LAB — IFOBT (OCCULT BLOOD): IFOBT: POSITIVE

## 2012-06-17 NOTE — Progress Notes (Signed)
Quick Note:  Heme positive.  I am extremely concerned about patient, as he has significant IDA, no prior TCS/EGD, and +Hep C. He needs to complete a urine drug screen as soon as possible, so we can set him up for procedures. He will need to be seen in our office for an updated H&P.  Continuing to use cocaine will only prolong these evaluations, and we are unable to rule out malignancy or other occult process that could be causing these lab abnormalities.  Please have him do the drug screen as soon as possible. How is he feeling? ______

## 2012-06-18 NOTE — Progress Notes (Signed)
Please see recommendations

## 2012-06-24 NOTE — Progress Notes (Signed)
Quick Note:  Pt is aware. He said that he was feeling ok. He is going to have UDS done this week.   Manuela Schwartz, please schedule ov for pt to set up tcs. ______

## 2012-07-08 ENCOUNTER — Ambulatory Visit (INDEPENDENT_AMBULATORY_CARE_PROVIDER_SITE_OTHER): Payer: Medicaid Other | Admitting: Gastroenterology

## 2012-07-08 ENCOUNTER — Encounter: Payer: Self-pay | Admitting: Gastroenterology

## 2012-07-08 VITALS — BP 126/78 | HR 102 | Temp 97.6°F | Ht 73.0 in | Wt 204.4 lb

## 2012-07-08 DIAGNOSIS — D509 Iron deficiency anemia, unspecified: Secondary | ICD-10-CM

## 2012-07-08 DIAGNOSIS — B182 Chronic viral hepatitis C: Secondary | ICD-10-CM

## 2012-07-08 DIAGNOSIS — F1910 Other psychoactive substance abuse, uncomplicated: Secondary | ICD-10-CM

## 2012-07-08 NOTE — Progress Notes (Signed)
Referring Provider: Iona Beard, MD Primary Care Physician:  Maggie Font, MD Primary GI: Dr. Gala Romney   Chief Complaint  Patient presents with  . Colonoscopy    HPI:   [name] is a pleasant 62 year old male with a history of chronic Hepatitis C, +RNA, genotype 1a, returning in follow-up to set up TCS/EGD. I saw him in April 2014, and he underwent labs with findings of significant IDA, worsening anemia since Sept 2013 with a Hgb of  8.4. Found to be heme positive. He is an active cocaine user, and we had requested a drug test prior to procedures to ensure his safety. He understood this, and he never completed the drug test. In fact, it was quite difficult getting up with him to obtain additional labs and to complete the drug test. Unfortunately, he tells me now that he used cocaine about 5 days ago.   BM about once per week. No bright red blood. Possible melena intermittently. No abdominal pain. No other concerns.    Past Medical History  Diagnosis Date  . Diabetes mellitus   . Hypertension   . Hepatitis C     states he was diagnosed in 2007 or 2007 while living in [place], [place]  . CVA (cerebral infarction) 01/2011  . Hyperlipidemia   . Asthma   . PVD (peripheral vascular disease)   . Anemia   . Stroke     Past Surgical History  Procedure Laterality Date  . Liver biopsy  2005    Done in [place], [place]. Chronic hepatitis with mild periportal inflammation, lobular unicellular necrosis and portal fibrosis. Grade 2, stage 1-2.    Current Outpatient Prescriptions  Medication Sig Dispense Refill  . aspirin 81 MG tablet Take 81 mg by mouth daily.      Marland Kitchen glipiZIDE (GLUCOTROL) 2.5 mg TABS Take 5 mg by mouth 2 (two) times daily before a meal.       . lisinopril-hydrochlorothiazide (PRINZIDE,ZESTORETIC) 20-12.5 MG per tablet Take 1 tablet by mouth 2 (two) times daily.      . metFORMIN (GLUCOPHAGE) 500 MG tablet Take 500 mg by mouth 2 (two) times daily with a meal.      . simvastatin  (ZOCOR) 20 MG tablet Take 20 mg by mouth daily at 6 PM.      . polyethylene glycol-electrolytes (TRILYTE) 420 G solution Take 4,000 mLs by mouth as directed.  4000 mL  0   No current facility-administered medications for this visit.    Allergies as of 07/08/2012  . (No Known Allergies)    Family History  Problem Relation Age of Onset  . Breast cancer Mother     deceased  . Cancer Mother   . Diabetes Father   . Hypertension Father   . Heart disease Father     deceased  . Hyperlipidemia Father   . Diabetes Sister   . Hypertension Sister   . Breast cancer Sister   . Colon cancer Neg Hx   . Liver disease Neg Hx     History   Social History  . Marital Status: Single    Spouse Name: N/A    Number of Children: 1  . Years of Education: N/A   Occupational History  . trying to get disability    Social History Main Topics  . Smoking status: Current Some Day Smoker -- 0.50 packs/day    Types: Cigarettes  . Smokeless tobacco: Never Used     Comment: given 1-800-Quit Now  . Alcohol Use: 0.0  oz/week     Comment: Drinks beer occasionally, once or twice a month. Never heavy.  . Drug Use: Yes    Special: Cocaine, Marijuana     Comment: last cocaine about 5 days ago  . Sexually Active: None   Other Topics Concern  . None   Social History Narrative  . None    Review of Systems: Negative unless mentioned in HPI.   Physical Exam: BP 126/78  Pulse 102  Temp(Src) 97.6 F (36.4 C) (Oral)  Ht 6\' 1"  (1.854 m)  Wt 204 lb 6.4 oz (92.715 kg)  BMI 26.97 kg/m2 General:   Alert and oriented. No distress noted. Pleasant and cooperative.  Head:  Normocephalic and atraumatic. Eyes:  Conjuctiva clear without scleral icterus. Mouth:  Oral mucosa pink and moist. Good dentition. No lesions. Heart:  S1, S2 present without murmurs, rubs, or gallops. Regular rate and rhythm. Abdomen:  +BS, soft, non-tender and non-distended. No rebound or guarding. No HSM or masses noted. Msk:   Symmetrical without gross deformities. Normal posture. Extremities:  Without edema. Neurologic:  Alert and  oriented x4;  grossly normal neurologically. Skin:  Intact without significant lesions or rashes. Psych:  Alert and cooperative. Normal mood and affect.   Lab Results  Component Value Date   WBC 7.5 05/20/2012   HGB 8.4* 05/20/2012   HCT 28.6* 05/20/2012   MCV 73.9* 05/20/2012   PLT 401* 05/20/2012   Lab Results  Component Value Date   IRON 14* 05/20/2012   FERRITIN 4* 05/20/2012   .

## 2012-07-08 NOTE — Patient Instructions (Addendum)
Please complete the drug screen next week and the blood work.  We will call with the results and set you up for a colonoscopy/upper endoscopy with Dr. Gala Romney.  Start taking Amitiza 1 capsule with food each night. After three nights, you may increase to twice a day. This is for constipation. TAKE WITH FOOD TO AVOID NAUSEA :)

## 2012-07-10 DIAGNOSIS — D509 Iron deficiency anemia, unspecified: Secondary | ICD-10-CM | POA: Insufficient documentation

## 2012-07-10 NOTE — Assessment & Plan Note (Addendum)
62 year old male with new-onset IDA, heme positive stools, possible melena intermittently. Notes constipation, with a BM once per week. Otherwise, no other symptoms. Needs first ever screening colonoscopy and upper endoscopy. He is an active user of cocaine, and he has already been informed that he will need a negative drug screen prior to his procedure. Unfortunately, he last used about 5 days ago. I discussed the safety reasons behind avoidance of illegal substances prior to a procedure. He states he will abstain henceforth. Discussed signs/symptoms that would prompt urgent medical evaluation. I also discussed my concern regarding his dropping Hgb since last year.   Drug screen Recheck CBC now Proceed with TCS/EGD with Dr. Gala Romney in near future: the risks, benefits, and alternatives have been discussed with the patient in detail. The patient states understanding and desires to proceed.WILL NEED PROPOFOL DUE TO POLYPHARMACY.  Start Amitiza 24 mcg po BID

## 2012-07-10 NOTE — Assessment & Plan Note (Signed)
+  RNA, Genotype 1a. Hep A and B immune. Needs referral to Hep C clinic; however, he is actively using illegal substances to include cocaine. Not a candidate for treatment until he is 6 months free. Proceed with TCS/EGD in near future, with Hep C follow-up thereafter.

## 2012-07-13 NOTE — Progress Notes (Signed)
Cc PCP 

## 2012-07-20 LAB — CBC WITH DIFFERENTIAL/PLATELET
Basophils Absolute: 0 10*3/uL (ref 0.0–0.1)
Basophils Relative: 0 % (ref 0–1)
HCT: 25.1 % — ABNORMAL LOW (ref 39.0–52.0)
Lymphocytes Relative: 29 % (ref 12–46)
MCHC: 31.1 g/dL (ref 30.0–36.0)
Monocytes Absolute: 0.7 10*3/uL (ref 0.1–1.0)
Neutro Abs: 3.9 10*3/uL (ref 1.7–7.7)
Neutrophils Relative %: 58 % (ref 43–77)
Platelets: 353 10*3/uL (ref 150–400)
RDW: 16.1 % — ABNORMAL HIGH (ref 11.5–15.5)
WBC: 6.9 10*3/uL (ref 4.0–10.5)

## 2012-07-21 LAB — DRUG SCREEN, URINE
Benzodiazepines.: NEGATIVE
Cocaine Metabolites: NEGATIVE
Creatinine,U: 43.58 mg/dL
Opiates: NEGATIVE
Phencyclidine (PCP): NEGATIVE
Propoxyphene: NEGATIVE

## 2012-07-22 NOTE — Progress Notes (Signed)
Quick Note:  So glad to see drug screen is negative. His Hgb is down from 8.4 in April to 7.19 July 2012.  ifobt positive.  He was asymptomatic at office visit. I don't think he needs blood right now, but he needs an EGD/TCS with RMR WITH PROPOFOL as soon as possible.  If he has any signs of melena or rectal bleeding, he needs to go to the ED. ______

## 2012-07-23 NOTE — Progress Notes (Signed)
Quick Note:  Pt is aware. Leighann, please schedule. ______

## 2012-07-27 ENCOUNTER — Other Ambulatory Visit: Payer: Self-pay | Admitting: Internal Medicine

## 2012-07-27 NOTE — Progress Notes (Signed)
There was a cancellation on Dr. Coralee North schedule for Thursday June 19 and I called Mr Rivett and he is refusing to take that appointment so Im not sure what to do with him now

## 2012-07-28 ENCOUNTER — Encounter (HOSPITAL_COMMUNITY): Payer: Medicaid Other | Attending: Internal Medicine

## 2012-07-28 ENCOUNTER — Encounter (HOSPITAL_COMMUNITY): Payer: Self-pay | Admitting: Pharmacy Technician

## 2012-07-28 ENCOUNTER — Encounter (HOSPITAL_COMMUNITY)
Admission: RE | Admit: 2012-07-28 | Discharge: 2012-07-28 | Disposition: A | Discharge status: Home / Self Care | Payer: Medicaid Other | Source: Ambulatory Visit | Attending: Internal Medicine | Admitting: Internal Medicine

## 2012-07-28 ENCOUNTER — Encounter (HOSPITAL_COMMUNITY): Payer: Self-pay

## 2012-07-28 DIAGNOSIS — D649 Anemia, unspecified: Secondary | ICD-10-CM | POA: Insufficient documentation

## 2012-07-28 LAB — BASIC METABOLIC PANEL
Calcium: 9 mg/dL (ref 8.4–10.5)
GFR calc Af Amer: 82 mL/min — ABNORMAL LOW (ref >90)
GFR calc non Af Amer: 71 mL/min — ABNORMAL LOW (ref >90)
Glucose, Bld: 98 mg/dL (ref 70–99)
Potassium: 4.1 mEq/L (ref 3.5–5.1)
Sodium: 139 mEq/L (ref 135–145)

## 2012-07-28 LAB — HEMOGLOBIN AND HEMATOCRIT, BLOOD
HCT: 23.2 % — ABNORMAL LOW (ref 39.0–52.0)
Hemoglobin: 6.8 g/dL — CL (ref 13.0–17.0)

## 2012-07-28 NOTE — Patient Instructions (Signed)
DEEPAK MOOK  07/28/2012   Your procedure is scheduled on:  07/30/12  Report to [facility] at 0700 AM.  Call this number if you have problems the morning of surgery: [phone]   Remember:   Do not eat food or drink liquids after midnight.   Take these medicines the morning of surgery with A SIP OF WATER: zestoretic   Do not wear jewelry, make-up or nail polish.  Do not wear lotions, powders, or perfumes. You may wear deodorant.  Do not shave 48 hours prior to surgery. Men may shave face and neck.  Do not bring valuables to the hospital.  [facility] is not responsible                   for any belongings or valuables.  Contacts, dentures or bridgework may not be worn into surgery.  Leave suitcase in the car. After surgery it may be brought to your room.  For patients admitted to the hospital, checkout time is 11:00 AM the day of  discharge.   Patients discharged the day of surgery will not be allowed to drive  home.  Name and phone number of your driver: family  Special Instructions: Shower using CHG 2 nights before surgery and the night before surgery.  If you shower the day of surgery use CHG.  Use special wash - you have one bottle of CHG for all showers.  You should use approximately 1/3 of the bottle for each shower.   Please read over the following fact sheets that you were given: Pain Booklet, Coughing and Deep Breathing, MRSA Information, Surgical Site Infection Prevention, Anesthesia Post-op Instructions and Care and Recovery After Surgery   PATIENT INSTRUCTIONS POST-ANESTHESIA  IMMEDIATELY FOLLOWING SURGERY:  Do not drive or operate machinery for the first twenty four hours after surgery.  Do not make any important decisions for twenty four hours after surgery or while taking narcotic pain medications or sedatives.  If you develop intractable nausea and vomiting or a severe headache please notify your doctor immediately.  FOLLOW-UP:  Please make an appointment with your  surgeon as instructed. You do not need to follow up with anesthesia unless specifically instructed to do so.  WOUND CARE INSTRUCTIONS (if applicable):  Keep a dry clean dressing on the anesthesia/puncture wound site if there is drainage.  Once the wound has quit draining you may leave it open to air.  Generally you should leave the bandage intact for twenty four hours unless there is drainage.  If the epidural site drains for more than 36-48 hours please call the anesthesia department.  QUESTIONS?:  Please feel free to call your physician or the hospital operator if you have any questions, and they will be happy to assist you.      Esophagogastroduodenoscopy Esophagogastroduodenoscopy (EGD) is a procedure to examine the lining of the esophagus, stomach, and first part of the small intestine (duodenum). A long, flexible, lighted tube with a camera attached (endoscope) is inserted down the throat to view these organs. This procedure is done to detect problems or abnormalities, such as inflammation, bleeding, ulcers, or growths, in order to treat them. The procedure lasts about 5 20 minutes. It is usually an outpatient procedure, but it may need to be performed in emergency cases in the hospital. LET YOUR CAREGIVER KNOW ABOUT:   Allergies to food or medicine.  All medicines you are taking, including vitamins, herbs, eyedrops, and over-the-counter medicines and creams.  Use of steroids (by  mouth or creams).  Previous problems you or members of your family have had with the use of anesthetics.  Any blood disorders you have.  Previous surgeries you have had.  Other health problems you have.  Possibility of pregnancy, if this applies. RISKS AND COMPLICATIONS  Generally, EGD is a safe procedure. However, as with any procedure, complications can occur. Possible complications include:  Infection.  Bleeding.  Tearing (perforation) of the esophagus, stomach, or duodenum.  Difficulty breathing  or not being able to breath.  Excessive sweating.  Spasms of the larynx.  Slowed heartbeat.  Low blood pressure. BEFORE THE PROCEDURE  Do not eat or drink anything for 6 8 hours before the procedure or as directed by your caregiver.  Ask your caregiver about changing or stopping your regular medicines.  If you wear dentures, be prepared to remove them before the procedure.  Arrange for someone to drive you home after the procedure. PROCEDURE   A vein will be accessed to give medicines and fluids. A medicine to relax you (sedative) and a pain reliever will be given through that access into the vein.  A numbing medicine (local anesthetic) may be sprayed on your throat for comfort and to stop you from gagging or coughing.  A mouth guard may be placed in your mouth to protect your teeth and to keep you from biting on the endoscope.  You will be asked to lie on your left side.  The endoscope is inserted down your throat and into the esophagus, stomach, and duodenum.  Air is put through the endoscope to allow your caregiver to view the lining of your esophagus clearly.  The esophagus, stomach, and duodenum is then examined. During the exam, your caregiver may:  Remove tissue to be examined under a microscope (biopsy) for inflammation, infection, or other medical problems.  Remove growths.  Remove objects (foreign bodies) that are stuck.  Treat any bleeding with medicines or other devices that stop tissues from bleeding (hot cauters, clipping devices).  Widen (dilate) or stretch narrowed areas of the esophagus and stomach.  The endoscope will then be withdrawn. AFTER THE PROCEDURE  You will be taken to a recovery area to be monitored. You will be able to go home once you are stable and alert.  Do not eat or drink anything until the local anesthetic and numbing medicines have worn off. You may choke.  It is normal to feel bloated, have pain with swallowing, or have a sore  throat for a short time. This will wear off.  Your caregiver should be able to discuss his or her findings with you. It will take longer to discuss the test results if any biopsies were taken. Document Released: 05/31/2004 Document Revised: 01/15/2012 Document Reviewed: 01/01/2012 [facility] Patient Information 2014 [place], [place]. Colonoscopy A colonoscopy is an exam to evaluate your entire colon. In this exam, your colon is cleansed. A long fiberoptic tube is inserted through your rectum and into your colon. The fiberoptic scope (endoscope) is a long bundle of enclosed and very flexible fibers. These fibers transmit light to the area examined and send images from that area to your caregiver. Discomfort is usually minimal. You may be given a drug to help you sleep (sedative) during or prior to the procedure. This exam helps to detect lumps (tumors), polyps, inflammation, and areas of bleeding. Your caregiver may also take a small piece of tissue (biopsy) that will be examined under a microscope. LET YOUR CAREGIVER KNOW ABOUT:  Allergies to food or medicine.  Medicines taken, including vitamins, herbs, eyedrops, over-the-counter medicines, and creams.  Use of steroids (by mouth or creams).  Previous problems with anesthetics or numbing medicines.  History of bleeding problems or blood clots.  Previous surgery.  Other health problems, including diabetes and kidney problems.  Possibility of pregnancy, if this applies. BEFORE THE PROCEDURE   A clear liquid diet may be required for 2 days before the exam.  Ask your caregiver about changing or stopping your regular medications.  Liquid injections (enemas) or laxatives may be required.  A large amount of electrolyte solution may be given to you to drink over a short period of time. This solution is used to clean out your colon.  You should be present 60 minutes prior to your procedure or as directed by your caregiver. AFTER THE PROCEDURE    If you received a sedative or pain relieving medication, you will need to arrange for someone to drive you home.  Occasionally, there is a little blood passed with the first bowel movement. Do not be concerned. FINDING OUT THE RESULTS OF YOUR TEST Not all test results are available during your visit. If your test results are not back during the visit, make an appointment with your caregiver to find out the results. Do not assume everything is normal if you have not heard from your caregiver or the medical facility. It is important for you to follow up on all of your test results. HOME CARE INSTRUCTIONS   It is not unusual to pass moderate amounts of gas and experience mild abdominal cramping following the procedure. This is due to air being used to inflate your colon during the exam. Walking or a warm pack on your belly (abdomen) may help.  You may resume all normal meals and activities after sedatives and medicines have worn off.  Only take over-the-counter or prescription medicines for pain, discomfort, or fever as directed by your caregiver. Do not use aspirin or blood thinners if a biopsy was taken. Consult your caregiver for medicine usage if biopsies were taken. SEEK IMMEDIATE MEDICAL CARE IF:   You have a fever.  You pass large blood clots or fill a toilet with blood following the procedure. This may also occur 10 to 14 days following the procedure. This is more likely if a biopsy was taken.  You develop abdominal pain that keeps getting worse and cannot be relieved with medicine. Document Released: 01/26/2000 Document Revised: 04/22/2011 Document Reviewed: 09/10/2007 [facility] Patient Information 2014 [place].

## 2012-07-28 NOTE — Progress Notes (Signed)
Dr. Gala Romney notified of critical Hgb 6.8/Hct 23.2. Dr. Gala Romney ordered for pt. To receive 2 upRBCs prior to procedure on 07/30/2012.  Office will notify pt.

## 2012-07-28 NOTE — Progress Notes (Signed)
Cory Weber presented for labwork. Labs per MD order drawn via Peripheral Line 23 gauge needle inserted in RT AC  Good blood return present. Procedure without incident.  Needle removed intact. Patient tolerated procedure well.  Blood band placed on patient

## 2012-07-28 NOTE — Addendum Note (Signed)
Addended by: Kurtis Bushman A on: 07/28/2012 03:38 PM   Modules accepted: Orders

## 2012-07-29 ENCOUNTER — Telehealth: Payer: Self-pay

## 2012-07-29 ENCOUNTER — Telehealth: Payer: Self-pay | Admitting: Internal Medicine

## 2012-07-29 ENCOUNTER — Encounter (HOSPITAL_COMMUNITY): Payer: Medicaid Other

## 2012-07-29 VITALS — BP 216/86 | HR 54 | Temp 98.3°F | Resp 16

## 2012-07-29 DIAGNOSIS — D649 Anemia, unspecified: Secondary | ICD-10-CM

## 2012-07-29 LAB — HEMOGLOBIN AND HEMATOCRIT, BLOOD: HCT: 27.2 % — ABNORMAL LOW (ref 39.0–52.0)

## 2012-07-29 MED ORDER — SODIUM CHLORIDE 0.9 % IJ SOLN: 10.0000 mL | INTRAMUSCULAR | Status: DC | PRN

## 2012-07-29 MED ORDER — SODIUM CHLORIDE 0.9 % IV SOLN
250.0000 mL | Freq: Once | INTRAVENOUS | Status: DC
Start: 2012-07-29 — End: 2012-07-29

## 2012-07-29 NOTE — Telephone Encounter (Signed)
Message copied by Claudina Lick on Wed Jul 29, 2012  4:29 PM ------      Message from: Daneil Dolin      Created: Wed Jul 29, 2012  3:07 PM       He should stay on bp meds and take them as prescribed including tomorrow in am with small amount of water      ----- Message -----         From: Marylou Mccoy, LPN         Sent: [id number]   2:24 PM           To: Daneil Dolin, MD            Becky called from the specialty clinic- pt did not take his BP medicine this morning before his transfusion and his BP was 209/73. She just wanted you to be aware.        ------

## 2012-07-29 NOTE — Telephone Encounter (Signed)
Spoke with pt and he is aware he needs to take his BP medicine with a small sip of water in the morning prior to his appt.

## 2012-07-29 NOTE — Progress Notes (Signed)
Tolerated transfusion well. 

## 2012-07-29 NOTE — Telephone Encounter (Signed)
I asked pt about this and he said he would let her know what is going on and that we should discuss things with him first and he will let us know if we can discuss with her.

## 2012-07-29 NOTE — Telephone Encounter (Signed)
This morning the sister of the patient called Sharon Mt) and she is wanting RMR to call her to let her know what is going on with patient because she doesn't think he is being honest with her. I told her without patient's consent we can't discuss his medical information with her. She said that she was listed for [place] to talk with. More than likely she is listed as an emergency contact, but I will check to see if we have any documentation of having patient's consent to discuss care with her. Her number is [phone]

## 2012-07-30 ENCOUNTER — Encounter (HOSPITAL_COMMUNITY)
Admission: RE | Disposition: A | Discharge status: Home / Self Care | Payer: Self-pay | Source: Ambulatory Visit | Attending: Internal Medicine

## 2012-07-30 ENCOUNTER — Ambulatory Visit (HOSPITAL_COMMUNITY)
Admission: RE | Admit: 2012-07-30 | Discharge: 2012-07-30 | Disposition: A | Discharge status: Home / Self Care | Payer: Medicaid Other | Source: Ambulatory Visit | Attending: Internal Medicine | Admitting: Internal Medicine

## 2012-07-30 ENCOUNTER — Encounter (HOSPITAL_COMMUNITY): Payer: Self-pay | Admitting: *Deleted

## 2012-07-30 ENCOUNTER — Telehealth: Payer: Self-pay

## 2012-07-30 DIAGNOSIS — Z5309 Procedure and treatment not carried out because of other contraindication: Secondary | ICD-10-CM | POA: Insufficient documentation

## 2012-07-30 DIAGNOSIS — D509 Iron deficiency anemia, unspecified: Secondary | ICD-10-CM | POA: Insufficient documentation

## 2012-07-30 LAB — TYPE AND SCREEN
ABO/RH(D): O POS
Antibody Screen: NEGATIVE
Unit division: 0
Unit division: 0

## 2012-07-30 LAB — HEMOGLOBIN AND HEMATOCRIT, BLOOD: Hemoglobin: 9 g/dL — ABNORMAL LOW (ref 13.0–17.0)

## 2012-07-30 SURGERY — CANCELLED PROCEDURE

## 2012-07-30 SURGICAL SUPPLY — 23 items
BLOCK BITE 60FR ADLT L/F BLUE (MISCELLANEOUS) IMPLANT
DEVICE CLIP HEMOSTAT 235CM (CLIP) IMPLANT
ELECT REM PT RETURN 9FT ADLT (ELECTROSURGICAL)
ELECTRODE REM PT RTRN 9FT ADLT (ELECTROSURGICAL) IMPLANT
FCP BXJMBJMB 240X2.8X (CUTTING FORCEPS)
FLOOR PAD 36X40 (MISCELLANEOUS)
FORCEPS BIOP RAD 4 LRG CAP 4 (CUTTING FORCEPS) IMPLANT
FORCEPS BIOP RJ4 240 W/NDL (CUTTING FORCEPS)
FORCEPS BXJMBJMB 240X2.8X (CUTTING FORCEPS) IMPLANT
INJECTOR/SNARE I SNARE (MISCELLANEOUS) IMPLANT
LUBRICANT JELLY 4.5OZ STERILE (MISCELLANEOUS) IMPLANT
MANIFOLD NEPTUNE II (INSTRUMENTS) IMPLANT
NEEDLE SCLEROTHERAPY 25GX240 (NEEDLE) IMPLANT
PAD FLOOR 36X40 (MISCELLANEOUS) IMPLANT
PROBE APC STR FIRE (PROBE) IMPLANT
PROBE INJECTION GOLD (MISCELLANEOUS)
PROBE INJECTION GOLD 7FR (MISCELLANEOUS) IMPLANT
SNARE ROTATE MED OVAL 20MM (MISCELLANEOUS) IMPLANT
SYR 50ML LL SCALE MARK (SYRINGE) IMPLANT
TRAP SPECIMEN MUCOUS 40CC (MISCELLANEOUS) IMPLANT
TUBING ENDO SMARTCAP PENTAX (MISCELLANEOUS) IMPLANT
TUBING IRRIGATION ENDOGATOR (MISCELLANEOUS) IMPLANT
WATER STERILE IRR 1000ML POUR (IV SOLUTION) IMPLANT

## 2012-07-30 NOTE — Telephone Encounter (Signed)
Patient is scheduled with AS on 08/12/12

## 2012-07-30 NOTE — Telephone Encounter (Signed)
RMR called this am- pt did not follow prep instructions and ate chicken, potatoes and green beans yesterday. He will need to be seen in the office by an extender in 10 days to update H&P and  get H&H to determine if he needs transfusion. hgb was 9 this morning. RMR wants pt to be rescheduled for tcs/egd July when he gets back  Please get pt appt with extender in 10 days.

## 2012-07-30 NOTE — Progress Notes (Addendum)
Pt came in for procedure and during history review, pt stated he ate "baked chicken, mashed potatoes, green beans and cornbread" about 1300 yesterday 07/29/12. Pt said he didn't know he wasn't supposed to eat. I asked pt if he received his instructions from Dr. Roseanne Kaufman office about his prep and he said yes and he showed them to me. I showed pt on his instructions where it said clear liquids all day yesterday and he said he "got confused". Pt's labwork was still drawn to see what his post-transfusion H&H was after 2 units of PRBCs yesterday. H&H came back at 9.0 and 28.6. Dr. Gala Romney notified and he gave order to cancel procedure due to not following his prep correctly and that pt would be seen in the office again for a appt before his TCS/EGD was rescheduled and they would discuss whether he needed another blood transfusion. Dr. Patsey Berthold notified of H&H and said that pt would need to be "topped off" a little before he could do his TCS/EGD with propofol. Dr. Gala Romney was notified of Dr. Domingo Dimes response. Dr. Roseanne Kaufman office will be contacted when they open this morning to set up an appt for pt. Pt was notified and he agreed with the new plan of care and understood.   Attending note:  As nicely documented above. Patient misread the instructions. He had our written instructions with him. We reviewed them. We reviewed them.  Patient acknowledges he did not follow our instructions. Unable to perform colonoscopy today. Dr. Patsey Berthold would prefer to do both the upper and lower in the same setting. I agree. Moreover, anesthesia prefers an H&H in the 10 and 30 range. Because of scheduling limitations, he will not be rescheduled prior to early July. Will have him seen in our office in 10 days to reassess H&H and determine any transfusion requirements and update his H&P.  I emphasized the importance of complying with the prep instructions so his procedures can be successful.

## 2012-08-12 ENCOUNTER — Ambulatory Visit (INDEPENDENT_AMBULATORY_CARE_PROVIDER_SITE_OTHER): Payer: Medicaid Other | Admitting: Gastroenterology

## 2012-08-12 ENCOUNTER — Encounter: Payer: Self-pay | Admitting: Gastroenterology

## 2012-08-12 VITALS — BP 155/85 | HR 88 | Temp 98.0°F | Ht 73.0 in | Wt 202.8 lb

## 2012-08-12 DIAGNOSIS — B182 Chronic viral hepatitis C: Secondary | ICD-10-CM

## 2012-08-12 DIAGNOSIS — D509 Iron deficiency anemia, unspecified: Secondary | ICD-10-CM

## 2012-08-12 MED ORDER — PEG 3350-KCL-NA BICARB-NACL 420 G PO SOLR: 4000.0000 mL | ORAL | Status: DC

## 2012-08-12 NOTE — Patient Instructions (Addendum)
Please complete the blood work and urine test next week.   We have scheduled you for a colonoscopy and upper endoscopy with Dr. Gala Romney.  You may need another blood transfusion before this; I will decide after looking at your blood work again.  Happy 4th of July!

## 2012-08-12 NOTE — Progress Notes (Signed)
Referring Provider: Iona Beard, MD Primary Care Physician:  Maggie Font, MD Primary GI: Dr. Gala Romney   Chief Complaint  Patient presents with  . Follow-up    HPI:   Cory Weber is a pleasant 62 year old male with a history of chronic Hepatitis C, +RNA, genotype 1a, returning in follow-up to set up TCS/EGD. I saw him in April 2014, and he underwent labs with findings of significant IDA, worsening anemia since Sept 2013 with a Hgb of 8.4. Found to be heme positive. He has a history of cocaine use, and once a negative drug screen was on file, he was scheduled for the procedures. During pre-op testing, he was found to have a Hgb of 6.8. He was transfused 2 units PRBCs, with improvement to 8.5. The day of the procedure, he admitted to eating the day prior. States he misunderstood the instructions. Now, he is ready to proceed with the procedures.  Denies any bright red blood or melena. No GI complaints other than chronic constipation.   Past Medical History  Diagnosis Date  . Diabetes mellitus   . Hypertension   . Hepatitis C     states he was diagnosed in 2007 or 2007 while living in [place], [place]  . CVA (cerebral infarction) 01/2011  . Hyperlipidemia   . Asthma   . PVD (peripheral vascular disease)   . Anemia   . Stroke   . [id number])     Past Surgical History  Procedure Laterality Date  . Liver biopsy  2005    Done in [place], [place]. Chronic hepatitis with mild periportal inflammation, lobular unicellular necrosis and portal fibrosis. Grade 2, stage 1-2.    Current Outpatient Prescriptions  Medication Sig Dispense Refill  . aspirin 81 MG tablet Take 81 mg by mouth daily.      Marland Kitchen glipiZIDE (GLUCOTROL) 2.5 mg TABS Take 5 mg by mouth 2 (two) times daily before a meal.       . lisinopril-hydrochlorothiazide (PRINZIDE,ZESTORETIC) 20-12.5 MG per tablet Take 1 tablet by mouth 2 (two) times daily.      . metFORMIN (GLUCOPHAGE) 500 MG tablet Take 500 mg by mouth 2 (two) times daily  with a meal.      . simvastatin (ZOCOR) 20 MG tablet Take 20 mg by mouth daily at 6 PM.      . polyethylene glycol-electrolytes (TRILYTE) 420 G solution Take 4,000 mLs by mouth as directed.  4000 mL  0   No current facility-administered medications for this visit.    Allergies as of 08/12/2012  . (No Known Allergies)    Family History  Problem Relation Age of Onset  . Breast cancer Mother     deceased  . Cancer Mother   . Diabetes Father   . Hypertension Father   . Heart disease Father     deceased  . Hyperlipidemia Father   . Diabetes Sister   . Hypertension Sister   . Breast cancer Sister   . Colon cancer Neg Hx   . Liver disease Neg Hx     History   Social History  . Marital Status: Single    Spouse Name: N/A    Number of Children: 1  . Years of Education: N/A   Occupational History  . trying to get disability    Social History Main Topics  . Smoking status: Current Every Day Smoker -- 0.50 packs/day for 20 years    Types: Cigarettes  . Smokeless tobacco: Never Used     Comment: given  1-800-Quit Now  . Alcohol Use: 0.0 oz/week     Comment: Drinks beer occasionally, once or twice a month. Never heavy.  last ETOH 1 week ago  . Drug Use: Yes    Special: Cocaine, Marijuana     Comment: last cocaine after last drug screen  . Sexually Active: None   Other Topics Concern  . None   Social History Narrative  . None    Review of Systems: Negative unless mentioned in HPI   Physical Exam: BP 155/85  Pulse 88  Temp(Src) 98 F (36.7 C) (Oral)  Ht 6\' 1"  (1.854 m)  Wt 202 lb 12.8 oz (91.989 kg)  BMI 26.76 kg/m2 General:   Alert and oriented. No distress noted. Pleasant and cooperative.  Head:  Normocephalic and atraumatic. Eyes:  Conjuctiva clear without scleral icterus. Mouth:  Oral mucosa pink and moist.  Neck:  Supple, without mass or thyromegaly. Heart:  S1, S2 present without murmurs, rubs, or gallops. Regular rate and rhythm. Abdomen:  +BS, soft,  non-tender and non-distended. No rebound or guarding. No HSM or masses noted. Msk:  Symmetrical without gross deformities. Normal posture. Pulses:  2+ DP noted bilaterally Extremities:  Without edema. Neurologic:  Alert and  oriented x4;  grossly normal neurologically. Skin:  Intact without significant lesions or rashes. Psych:  Alert and cooperative. Normal mood and affect.

## 2012-08-15 NOTE — Assessment & Plan Note (Signed)
Genotype 1a. Immune to Hep A and B. Needs referral in near future for possible treatment, if he is able to abstain from cocaine. Will address this after work-up for IDA.

## 2012-08-15 NOTE — Assessment & Plan Note (Signed)
62 year old male with IDA, actually requiring 2 units PRBCs recently due to dropping Hgb. TCS/EGD cancelled recently due to misunderstanding prep instructions. He is a cocaine user, and we have had difficulty in the past having him follow through with drug screens and blood work. However, he presents again today ready to proceed with initial screening colonoscopy plus EGD due to persistent IDA. No overt signs of GI bleeding; constipation is his only complaint. Trial of Linzess 290 mcg daily.   Proceed with TCS/EGD with Dr. Gala Romney in near future: the risks, benefits, and alternatives have been discussed with the patient in detail. The patient states understanding and desires to proceed. PROPOFOL due to history of cocaine use DRUG SCREEN Repeat CBC now: if less than 10, proceed with an additional unit(s) in preparation for procedure

## 2012-08-17 NOTE — Progress Notes (Signed)
Cc PCP 

## 2012-08-27 ENCOUNTER — Encounter (HOSPITAL_COMMUNITY)
Admission: RE | Admit: 2012-08-27 | Discharge: 2012-08-27 | Disposition: A | Discharge status: Home / Self Care | Payer: Medicaid Other | Source: Ambulatory Visit

## 2012-08-27 ENCOUNTER — Telehealth: Payer: Self-pay | Admitting: General Practice

## 2012-08-27 DIAGNOSIS — D649 Anemia, unspecified: Secondary | ICD-10-CM

## 2012-08-27 NOTE — Telephone Encounter (Signed)
I received a call from day surgery and Cory Weber has no showed for his pre-op visit.  They tried to call him and there was no answer.  They also tried some of his contacts([employer])and was still unable to reach him.

## 2012-08-27 NOTE — Patient Instructions (Addendum)
Cory Weber  08/27/2012   Your procedure is scheduled on:  09/03/2012  Report to [facility] at  615  AM.  Call this number if you have problems the morning of surgery: [phone]   Remember:   Do not eat food or drink liquids after midnight.   Take these medicines the morning of surgery with A SIP OF WATER: zestoretic   Do not wear jewelry, make-up or nail polish.  Do not wear lotions, powders, or perfumes.   Do not shave 48 hours prior to surgery. Men may shave face and neck.  Do not bring valuables to the hospital.  [facility] is not responsible for any belongings or valuables.  Contacts, dentures or bridgework may not be worn into surgery.  Leave suitcase in the car. After surgery it may be brought to your room.  For patients admitted to the hospital, checkout time is 11:00 AM the day of discharge.   Patients discharged the day of surgery will not be allowed to drive home.  Name and phone number of your driver: family  Special Instructions: N/A   Please read over the following fact sheets that you were given: Pain Booklet, Blood Transfusion Information, Surgical Site Infection Prevention, Anesthesia Post-op Instructions and Care and Recovery After Surgery Colonoscopy A colonoscopy is an exam to evaluate your entire colon. In this exam, your colon is cleansed. A long fiberoptic tube is inserted through your rectum and into your colon. The fiberoptic scope (endoscope) is a long bundle of enclosed and very flexible fibers. These fibers transmit light to the area examined and send images from that area to your caregiver. Discomfort is usually minimal. You may be given a drug to help you sleep (sedative) during or prior to the procedure. This exam helps to detect lumps (tumors), polyps, inflammation, and areas of bleeding. Your caregiver may also take a small piece of tissue (biopsy) that will be examined under a microscope. LET YOUR CAREGIVER KNOW ABOUT:   Allergies to food  or medicine.  Medicines taken, including vitamins, herbs, eyedrops, over-the-counter medicines, and creams.  Use of steroids (by mouth or creams).  Previous problems with anesthetics or numbing medicines.  History of bleeding problems or blood clots.  Previous surgery.  Other health problems, including diabetes and kidney problems.  Possibility of pregnancy, if this applies. BEFORE THE PROCEDURE   A clear liquid diet may be required for 2 days before the exam.  Ask your caregiver about changing or stopping your regular medications.  Liquid injections (enemas) or laxatives may be required.  A large amount of electrolyte solution may be given to you to drink over a short period of time. This solution is used to clean out your colon.  You should be present 60 minutes prior to your procedure or as directed by your caregiver. AFTER THE PROCEDURE   If you received a sedative or pain relieving medication, you will need to arrange for someone to drive you home.  Occasionally, there is a little blood passed with the first bowel movement. Do not be concerned. FINDING OUT THE RESULTS OF YOUR TEST Not all test results are available during your visit. If your test results are not back during the visit, make an appointment with your caregiver to find out the results. Do not assume everything is normal if you have not heard from your caregiver or the medical facility. It is important for you to follow up on all of your  test results. HOME CARE INSTRUCTIONS   It is not unusual to pass moderate amounts of gas and experience mild abdominal cramping following the procedure. This is due to air being used to inflate your colon during the exam. Walking or a warm pack on your belly (abdomen) may help.  You may resume all normal meals and activities after sedatives and medicines have worn off.  Only take over-the-counter or prescription medicines for pain, discomfort, or fever as directed by your  caregiver. Do not use aspirin or blood thinners if a biopsy was taken. Consult your caregiver for medicine usage if biopsies were taken. SEEK IMMEDIATE MEDICAL CARE IF:   You have a fever.  You pass large blood clots or fill a toilet with blood following the procedure. This may also occur 10 to 14 days following the procedure. This is more likely if a biopsy was taken.  You develop abdominal pain that keeps getting worse and cannot be relieved with medicine. Document Released: 01/26/2000 Document Revised: 04/22/2011 Document Reviewed: 09/10/2007 [facility] Patient Information 2014 [place]. Esophagogastroduodenoscopy Esophagogastroduodenoscopy (EGD) is a procedure to examine the lining of the esophagus, stomach, and first part of the small intestine (duodenum). A long, flexible, lighted tube with a camera attached (endoscope) is inserted down the throat to view these organs. This procedure is done to detect problems or abnormalities, such as inflammation, bleeding, ulcers, or growths, in order to treat them. The procedure lasts about 5 20 minutes. It is usually an outpatient procedure, but it may need to be performed in emergency cases in the hospital. LET YOUR CAREGIVER KNOW ABOUT:   Allergies to food or medicine.  All medicines you are taking, including vitamins, herbs, eyedrops, and over-the-counter medicines and creams.  Use of steroids (by mouth or creams).  Previous problems you or members of your family have had with the use of anesthetics.  Any blood disorders you have.  Previous surgeries you have had.  Other health problems you have.  Possibility of pregnancy, if this applies. RISKS AND COMPLICATIONS  Generally, EGD is a safe procedure. However, as with any procedure, complications can occur. Possible complications include:  Infection.  Bleeding.  Tearing (perforation) of the esophagus, stomach, or duodenum.  Difficulty breathing or not being able to  breath.  Excessive sweating.  Spasms of the larynx.  Slowed heartbeat.  Low blood pressure. BEFORE THE PROCEDURE  Do not eat or drink anything for 6 8 hours before the procedure or as directed by your caregiver.  Ask your caregiver about changing or stopping your regular medicines.  If you wear dentures, be prepared to remove them before the procedure.  Arrange for someone to drive you home after the procedure. PROCEDURE   A vein will be accessed to give medicines and fluids. A medicine to relax you (sedative) and a pain reliever will be given through that access into the vein.  A numbing medicine (local anesthetic) may be sprayed on your throat for comfort and to stop you from gagging or coughing.  A mouth guard may be placed in your mouth to protect your teeth and to keep you from biting on the endoscope.  You will be asked to lie on your left side.  The endoscope is inserted down your throat and into the esophagus, stomach, and duodenum.  Air is put through the endoscope to allow your caregiver to view the lining of your esophagus clearly.  The esophagus, stomach, and duodenum is then examined. During the exam, your caregiver may:  Remove tissue to be examined under a microscope (biopsy) for inflammation, infection, or other medical problems.  Remove growths.  Remove objects (foreign bodies) that are stuck.  Treat any bleeding with medicines or other devices that stop tissues from bleeding (hot cauters, clipping devices).  Widen (dilate) or stretch narrowed areas of the esophagus and stomach.  The endoscope will then be withdrawn. AFTER THE PROCEDURE  You will be taken to a recovery area to be monitored. You will be able to go home once you are stable and alert.  Do not eat or drink anything until the local anesthetic and numbing medicines have worn off. You may choke.  It is normal to feel bloated, have pain with swallowing, or have a sore throat for a short  time. This will wear off.  Your caregiver should be able to discuss his or her findings with you. It will take longer to discuss the test results if any biopsies were taken. Document Released: 05/31/2004 Document Revised: 01/15/2012 Document Reviewed: 01/01/2012 [facility] Patient Information 2014 [place], [place]. PATIENT INSTRUCTIONS POST-ANESTHESIA  IMMEDIATELY FOLLOWING SURGERY:  Do not drive or operate machinery for the first twenty four hours after surgery.  Do not make any important decisions for twenty four hours after surgery or while taking narcotic pain medications or sedatives.  If you develop intractable nausea and vomiting or a severe headache please notify your doctor immediately.  FOLLOW-UP:  Please make an appointment with your surgeon as instructed. You do not need to follow up with anesthesia unless specifically instructed to do so.  WOUND CARE INSTRUCTIONS (if applicable):  Keep a dry clean dressing on the anesthesia/puncture wound site if there is drainage.  Once the wound has quit draining you may leave it open to air.  Generally you should leave the bandage intact for twenty four hours unless there is drainage.  If the epidural site drains for more than 36-48 hours please call the anesthesia department.  QUESTIONS?:  Please feel free to call your physician or the hospital operator if you have any questions, and they will be happy to assist you.

## 2012-08-27 NOTE — Telephone Encounter (Signed)
Ive tried to reach Cory Weber and cant get him

## 2012-08-31 NOTE — Telephone Encounter (Signed)
We will need to cancel his procedure if he does not get his blood work and urine drug screen performed. He may or may not need additional blood, and he is an active cocaine user. If this is positive, it will need to be postponed. I have discussed this in detail with him on multiple occasions.  I attempted to call him as well.

## 2012-09-01 ENCOUNTER — Other Ambulatory Visit: Payer: Self-pay

## 2012-09-01 ENCOUNTER — Encounter (HOSPITAL_COMMUNITY)
Admission: RE | Admit: 2012-09-01 | Discharge: 2012-09-01 | Disposition: A | Discharge status: Home / Self Care | Payer: Medicaid Other | Source: Ambulatory Visit | Attending: Internal Medicine | Admitting: Internal Medicine

## 2012-09-01 LAB — BASIC METABOLIC PANEL
BUN: 10 mg/dL (ref 6–23)
CO2: 29 mEq/L (ref 19–32)
Calcium: 9.8 mg/dL (ref 8.4–10.5)
Chloride: 101 mEq/L (ref 96–112)
Creatinine, Ser: 1 mg/dL (ref 0.50–1.35)

## 2012-09-01 LAB — HEMOGLOBIN AND HEMATOCRIT, BLOOD: Hemoglobin: 10.3 g/dL — ABNORMAL LOW (ref 13.0–17.0)

## 2012-09-01 NOTE — Telephone Encounter (Signed)
He has been R/S for his preop for today at 1:15 hopefully he will show

## 2012-09-01 NOTE — Telephone Encounter (Signed)
Patient did show for his pre op today

## 2012-09-01 NOTE — Telephone Encounter (Signed)
Drug screen not completed at pre-op. I have printed urine drug screen, provided to patient.

## 2012-09-01 NOTE — Addendum Note (Signed)
Addended by: Orvil Feil on: 09/01/2012 03:12 PM   Modules accepted: Orders

## 2012-09-02 LAB — DRUG SCREEN, URINE
Amphetamine Screen, Ur: NEGATIVE
Barbiturate Quant, Ur: NEGATIVE
Marijuana Metabolite: NEGATIVE
Methadone: NEGATIVE
Propoxyphene: NEGATIVE

## 2012-09-03 ENCOUNTER — Encounter (HOSPITAL_COMMUNITY)
Admission: RE | Disposition: A | Discharge status: Home / Self Care | Payer: Self-pay | Source: Ambulatory Visit | Attending: Internal Medicine

## 2012-09-03 ENCOUNTER — Encounter (HOSPITAL_COMMUNITY): Payer: Self-pay | Admitting: Anesthesiology

## 2012-09-03 ENCOUNTER — Encounter (HOSPITAL_COMMUNITY): Payer: Self-pay | Admitting: *Deleted

## 2012-09-03 ENCOUNTER — Ambulatory Visit (HOSPITAL_COMMUNITY)
Admission: RE | Admit: 2012-09-03 | Discharge: 2012-09-03 | Disposition: A | Discharge status: Home / Self Care | Payer: Medicaid Other | Source: Ambulatory Visit | Attending: Internal Medicine | Admitting: Internal Medicine

## 2012-09-03 ENCOUNTER — Ambulatory Visit (HOSPITAL_COMMUNITY): Payer: Medicaid Other | Admitting: Anesthesiology

## 2012-09-03 DIAGNOSIS — E785 Hyperlipidemia, unspecified: Secondary | ICD-10-CM | POA: Insufficient documentation

## 2012-09-03 DIAGNOSIS — K269 Duodenal ulcer, unspecified as acute or chronic, without hemorrhage or perforation: Secondary | ICD-10-CM | POA: Insufficient documentation

## 2012-09-03 DIAGNOSIS — D509 Iron deficiency anemia, unspecified: Secondary | ICD-10-CM | POA: Insufficient documentation

## 2012-09-03 DIAGNOSIS — F17200 Nicotine dependence, unspecified, uncomplicated: Secondary | ICD-10-CM | POA: Insufficient documentation

## 2012-09-03 DIAGNOSIS — K2960 Other gastritis without bleeding: Secondary | ICD-10-CM | POA: Insufficient documentation

## 2012-09-03 DIAGNOSIS — Q438 Other specified congenital malformations of intestine: Secondary | ICD-10-CM | POA: Insufficient documentation

## 2012-09-03 DIAGNOSIS — R195 Other fecal abnormalities: Secondary | ICD-10-CM | POA: Insufficient documentation

## 2012-09-03 DIAGNOSIS — E119 Type 2 diabetes mellitus without complications: Secondary | ICD-10-CM | POA: Insufficient documentation

## 2012-09-03 DIAGNOSIS — F1210 Cannabis abuse, uncomplicated: Secondary | ICD-10-CM | POA: Insufficient documentation

## 2012-09-03 DIAGNOSIS — B1920 Unspecified viral hepatitis C without hepatic coma: Secondary | ICD-10-CM | POA: Insufficient documentation

## 2012-09-03 DIAGNOSIS — K314 Gastric diverticulum: Secondary | ICD-10-CM

## 2012-09-03 DIAGNOSIS — K259 Gastric ulcer, unspecified as acute or chronic, without hemorrhage or perforation: Secondary | ICD-10-CM | POA: Insufficient documentation

## 2012-09-03 DIAGNOSIS — Z79899 Other long term (current) drug therapy: Secondary | ICD-10-CM | POA: Insufficient documentation

## 2012-09-03 DIAGNOSIS — J45909 Unspecified asthma, uncomplicated: Secondary | ICD-10-CM | POA: Insufficient documentation

## 2012-09-03 DIAGNOSIS — F1410 Cocaine abuse, uncomplicated: Secondary | ICD-10-CM | POA: Insufficient documentation

## 2012-09-03 DIAGNOSIS — I10 Essential (primary) hypertension: Secondary | ICD-10-CM | POA: Insufficient documentation

## 2012-09-03 DIAGNOSIS — K449 Diaphragmatic hernia without obstruction or gangrene: Secondary | ICD-10-CM | POA: Insufficient documentation

## 2012-09-03 DIAGNOSIS — A048 Other specified bacterial intestinal infections: Secondary | ICD-10-CM | POA: Insufficient documentation

## 2012-09-03 DIAGNOSIS — K648 Other hemorrhoids: Secondary | ICD-10-CM

## 2012-09-03 DIAGNOSIS — D126 Benign neoplasm of colon, unspecified: Secondary | ICD-10-CM | POA: Insufficient documentation

## 2012-09-03 DIAGNOSIS — Z8673 Personal history of transient ischemic attack (TIA), and cerebral infarction without residual deficits: Secondary | ICD-10-CM | POA: Insufficient documentation

## 2012-09-03 DIAGNOSIS — Z7982 Long term (current) use of aspirin: Secondary | ICD-10-CM | POA: Insufficient documentation

## 2012-09-03 HISTORY — PX: COLONOSCOPY WITH PROPOFOL: SHX5780

## 2012-09-03 HISTORY — PX: BIOPSY: SHX5522

## 2012-09-03 HISTORY — PX: POLYPECTOMY: SHX5525

## 2012-09-03 HISTORY — PX: ESOPHAGOGASTRODUODENOSCOPY (EGD) WITH PROPOFOL: SHX5813

## 2012-09-03 LAB — GLUCOSE, CAPILLARY
Glucose-Capillary: 130 mg/dL — ABNORMAL HIGH (ref 70–99)
Glucose-Capillary: 106 mg/dL — ABNORMAL HIGH (ref 70–99)

## 2012-09-03 SURGERY — COLONOSCOPY WITH PROPOFOL
Anesthesia: Monitor Anesthesia Care | Site: Rectum

## 2012-09-03 MED ORDER — PROPOFOL 10 MG/ML IV EMUL
INTRAVENOUS | Status: AC
  Filled 2012-09-03: qty 20

## 2012-09-03 MED ORDER — LACTATED RINGERS IV SOLN
INTRAVENOUS | Status: DC
  Administered 2012-09-03: 07:00:00 via INTRAVENOUS

## 2012-09-03 MED ORDER — ONDANSETRON HCL 4 MG/2ML IJ SOLN: 4.0000 mg | Freq: Once | INTRAMUSCULAR | Status: DC | PRN

## 2012-09-03 MED ORDER — STERILE WATER FOR IRRIGATION IR SOLN
Status: DC | PRN
  Administered 2012-09-03: 08:00:00

## 2012-09-03 MED ORDER — FENTANYL CITRATE 0.05 MG/ML IJ SOLN
INTRAMUSCULAR | Status: AC
  Filled 2012-09-03: qty 2

## 2012-09-03 MED ORDER — ONDANSETRON HCL 4 MG/2ML IJ SOLN
INTRAMUSCULAR | Status: AC
  Filled 2012-09-03: qty 2

## 2012-09-03 MED ORDER — GLYCOPYRROLATE 0.2 MG/ML IJ SOLN
0.2000 mg | Freq: Once | INTRAMUSCULAR | Status: AC
  Administered 2012-09-03: 0.2 mg via INTRAVENOUS

## 2012-09-03 MED ORDER — ONDANSETRON HCL 4 MG/2ML IJ SOLN
4.0000 mg | Freq: Once | INTRAMUSCULAR | Status: AC
  Administered 2012-09-03: 4 mg via INTRAVENOUS

## 2012-09-03 MED ORDER — GLYCOPYRROLATE 0.2 MG/ML IJ SOLN
INTRAMUSCULAR | Status: AC
  Filled 2012-09-03: qty 1

## 2012-09-03 MED ORDER — FENTANYL CITRATE 0.05 MG/ML IJ SOLN
INTRAMUSCULAR | Status: DC | PRN
  Administered 2012-09-03 (×2): 50 ug via INTRAVENOUS

## 2012-09-03 MED ORDER — PROPOFOL INFUSION 10 MG/ML OPTIME
INTRAVENOUS | Status: DC | PRN
  Administered 2012-09-03: 100 ug/kg/min via INTRAVENOUS
  Administered 2012-09-03: 08:00:00 via INTRAVENOUS

## 2012-09-03 MED ORDER — MIDAZOLAM HCL 2 MG/2ML IJ SOLN
1.0000 mg | INTRAMUSCULAR | Status: DC | PRN
  Administered 2012-09-03: 2 mg via INTRAVENOUS

## 2012-09-03 MED ORDER — LIDOCAINE HCL (PF) 1 % IJ SOLN
INTRAMUSCULAR | Status: AC
  Filled 2012-09-03: qty 5

## 2012-09-03 MED ORDER — FENTANYL CITRATE 0.05 MG/ML IJ SOLN: 25.0000 ug | INTRAMUSCULAR | Status: DC | PRN

## 2012-09-03 MED ORDER — BUTAMBEN-TETRACAINE-BENZOCAINE 2-2-14 % EX AERO
1.0000 | INHALATION_SPRAY | Freq: Once | CUTANEOUS | Status: AC
  Administered 2012-09-03: 1 via TOPICAL
  Filled 2012-09-03: qty 56

## 2012-09-03 MED ORDER — FENTANYL CITRATE 0.05 MG/ML IJ SOLN: 25.0000 ug | INTRAMUSCULAR | Status: AC

## 2012-09-03 MED ORDER — MIDAZOLAM HCL 2 MG/2ML IJ SOLN
INTRAMUSCULAR | Status: AC
  Filled 2012-09-03: qty 2

## 2012-09-03 SURGICAL SUPPLY — 24 items
BLOCK BITE 60FR ADLT L/F BLUE (MISCELLANEOUS) ×3 IMPLANT
CONT PRE FILLED 20ML (MISCELLANEOUS) ×9 IMPLANT
DEVICE CLIP HEMOSTAT 235CM (CLIP) IMPLANT
ELECT REM PT RETURN 9FT ADLT (ELECTROSURGICAL)
ELECTRODE REM PT RTRN 9FT ADLT (ELECTROSURGICAL) IMPLANT
FCP BXJMBJMB 240X2.8X (CUTTING FORCEPS)
FLOOR PAD 36X40 (MISCELLANEOUS) ×3
FORCEPS BIOP RAD 4 LRG CAP 4 (CUTTING FORCEPS) ×3 IMPLANT
FORCEPS BIOP RJ4 240 W/NDL (CUTTING FORCEPS)
FORCEPS BXJMBJMB 240X2.8X (CUTTING FORCEPS) IMPLANT
INJECTOR/SNARE I SNARE (MISCELLANEOUS) IMPLANT
LUBRICANT JELLY 4.5OZ STERILE (MISCELLANEOUS) ×3 IMPLANT
MANIFOLD NEPTUNE II (INSTRUMENTS) ×3 IMPLANT
NEEDLE SCLEROTHERAPY 25GX240 (NEEDLE) IMPLANT
PAD FLOOR 36X40 (MISCELLANEOUS) ×2 IMPLANT
PROBE APC STR FIRE (PROBE) IMPLANT
PROBE INJECTION GOLD (MISCELLANEOUS)
PROBE INJECTION GOLD 7FR (MISCELLANEOUS) IMPLANT
SNARE ROTATE MED OVAL 20MM (MISCELLANEOUS) ×3 IMPLANT
SYR 50ML LL SCALE MARK (SYRINGE) ×3 IMPLANT
TRAP SPECIMEN MUCOUS 40CC (MISCELLANEOUS) ×3 IMPLANT
TUBING ENDO SMARTCAP PENTAX (MISCELLANEOUS) ×3 IMPLANT
TUBING IRRIGATION ENDOGATOR (MISCELLANEOUS) ×3 IMPLANT
WATER STERILE IRR 1000ML POUR (IV SOLUTION) ×3 IMPLANT

## 2012-09-03 NOTE — Interval H&P Note (Signed)
History and Physical Interval Note:  09/03/2012 7:30 AM  Cory Weber  has presented today for surgery, with the diagnosis of IDA   AND HEP C  The various methods of treatment have been discussed with the patient and family. After consideration of risks, benefits and other options for treatment, the patient has consented to  Procedure(s) with comments: COLONOSCOPY WITH PROPOFOL (N/A) - 7:30 OR DUE TO DRUG ABUSE ESOPHAGOGASTRODUODENOSCOPY (EGD) WITH PROPOFOL (N/A) as a surgical intervention .  The patient's history has been reviewed, patient examined, no change in status, stable for surgery.  I have reviewed the patient's chart and labs.  Questions were answered to the patient's satisfaction.      No change. Hemoglobin of 10.3 after transfusion. Drug screen negative. EGD and colonoscopy per plan for iron deficiency anemia and Hemoccult-positive stool.  The risks, benefits, limitations, imponderables and alternatives regarding both EGD and colonoscopy have been reviewed with the patient. Questions have been answered. All parties agreeable.    Manus Rudd

## 2012-09-03 NOTE — H&P (View-Only) (Signed)
Referring Provider: Iona Beard, MD Primary Care Physician:  Cory Font, MD Primary GI: Dr. Gala Weber   Chief Complaint  Patient presents with  . Follow-up    HPI:   Cory Weber is a pleasant 62 year old male with a history of chronic Hepatitis C, +RNA, genotype 1a, returning in follow-up to set up TCS/EGD. I saw him in April 2014, and he underwent labs with findings of significant IDA, worsening anemia since Sept 2013 with a Hgb of 8.4. Found to be heme positive. He has a history of cocaine use, and once a negative drug screen was on file, he was scheduled for the procedures. During pre-op testing, he was found to have a Hgb of 6.8. He was transfused 2 units PRBCs, with improvement to 8.5. The day of the procedure, he admitted to eating the day prior. States he misunderstood the instructions. Now, he is ready to proceed with the procedures.  Denies any bright red blood or melena. No GI complaints other than chronic constipation.   Past Medical History  Diagnosis Date  . Diabetes mellitus   . Hypertension   . Hepatitis C     states he was diagnosed in 2007 or 2007 while living in [place], [place]  . CVA (cerebral infarction) 01/2011  . Hyperlipidemia   . Asthma   . PVD (peripheral vascular disease)   . Anemia   . Stroke   . KQ:[id number])     Past Surgical History  Procedure Laterality Date  . Liver biopsy  2005    Done in [place], [place]. Chronic hepatitis with mild periportal inflammation, lobular unicellular necrosis and portal fibrosis. Grade 2, stage 1-2.    Current Outpatient Prescriptions  Medication Sig Dispense Refill  . aspirin 81 MG tablet Take 81 mg by mouth daily.      Marland Kitchen glipiZIDE (GLUCOTROL) 2.5 mg TABS Take 5 mg by mouth 2 (two) times daily before a meal.       . lisinopril-hydrochlorothiazide (PRINZIDE,ZESTORETIC) 20-12.5 MG per tablet Take 1 tablet by mouth 2 (two) times daily.      . metFORMIN (GLUCOPHAGE) 500 MG tablet Take 500 mg by mouth 2 (two) times daily  with a meal.      . simvastatin (ZOCOR) 20 MG tablet Take 20 mg by mouth daily at 6 PM.      . polyethylene glycol-electrolytes (TRILYTE) 420 G solution Take 4,000 mLs by mouth as directed.  4000 mL  0   No current facility-administered medications for this visit.    Allergies as of 08/12/2012  . (No Known Allergies)    Family History  Problem Relation Age of Onset  . Breast cancer Mother     deceased  . Cancer Mother   . Diabetes Father   . Hypertension Father   . Heart disease Father     deceased  . Hyperlipidemia Father   . Diabetes Sister   . Hypertension Sister   . Breast cancer Sister   . Colon cancer Neg Hx   . Liver disease Neg Hx     History   Social History  . Marital Status: Single    Spouse Name: N/A    Number of Children: 1  . Years of Education: N/A   Occupational History  . trying to get disability    Social History Main Topics  . Smoking status: Current Every Day Smoker -- 0.50 packs/day for 20 years    Types: Cigarettes  . Smokeless tobacco: Never Used     Comment: given  1-800-Quit Now  . Alcohol Use: 0.0 oz/week     Comment: Drinks beer occasionally, once or twice a month. Never heavy.  last ETOH 1 week ago  . Drug Use: Yes    Special: Cocaine, Marijuana     Comment: last cocaine after last drug screen  . Sexually Active: None   Other Topics Concern  . None   Social History Narrative  . None    Review of Systems: Negative unless mentioned in HPI   Physical Exam: BP 155/85  Pulse 88  Temp(Src) 98 F (36.7 C) (Oral)  Ht 6\' 1"  (1.854 m)  Wt 202 lb 12.8 oz (91.989 kg)  BMI 26.76 kg/m2 General:   Alert and oriented. No distress noted. Pleasant and cooperative.  Head:  Normocephalic and atraumatic. Eyes:  Conjuctiva clear without scleral icterus. Mouth:  Oral mucosa pink and moist.  Neck:  Supple, without mass or thyromegaly. Heart:  S1, S2 present without murmurs, rubs, or gallops. Regular rate and rhythm. Abdomen:  +BS, soft,  non-tender and non-distended. No rebound or guarding. No HSM or masses noted. Msk:  Symmetrical without gross deformities. Normal posture. Pulses:  2+ DP noted bilaterally Extremities:  Without edema. Neurologic:  Alert and  oriented x4;  grossly normal neurologically. Skin:  Intact without significant lesions or rashes. Psych:  Alert and cooperative. Normal mood and affect.

## 2012-09-03 NOTE — Transfer of Care (Signed)
Immediate Anesthesia Transfer of Care Note  Patient: Cory Weber  Procedure(s) Performed: Procedure(s) (LRB): COLONOSCOPY WITH PROPOFOL (N/A) ESOPHAGOGASTRODUODENOSCOPY (EGD) WITH PROPOFOL (N/A) BIOPSY (N/A) POLYPECTOMY (N/A)  Patient Location: PACU  Anesthesia Type: MAC  Level of Consciousness: awake  Airway & Oxygen Therapy: Patient Spontanous Breathing.   Post-op Assessment: Report given to PACU RN, Post -op Vital signs reviewed and stable and Patient moving all extremities  Post vital signs: Reviewed and stable  Complications: No apparent anesthesia complications

## 2012-09-03 NOTE — Op Note (Signed)
[facility] [address] [place], [address]   ENDOSCOPY PROCEDURE REPORT  PATIENT: Liron, Heineman  MR#: [id number] BIRTHDATE: 1950/07/19 , 63  yrs. old GENDER: Other ENDOSCOPIST: Bridgette Habermann, MD FACP FACG REFERRED BY:  Iona Beard, M.D. PROCEDURE DATE:  09/03/2012 PROCEDURE:     EGD with gastric biopsy  INDICATIONS:      iron deficiency anemia; Hemoccult positive stool  INFORMED CONSENT:   The risks, benefits, limitations, alternatives and imponderables have been discussed.  The potential for biopsy, esophogeal dilation, etc. have also been reviewed.  Questions have been answered.  All parties agreeable.  Please see the history and physical in the medical record for more information.  MEDICATIONS: deep sedation per Dr[facility]  DESCRIPTION OF PROCEDURE:   The     endoscope was introduced through the mouth and advanced to the second portion of the duodenum without difficulty or limitations.  The mucosal surfaces were surveyed very carefully during advancement of the scope and upon withdrawal.  Retroflexion view of the proximal stomach and esophagogastric junction was performed.      FINDINGS:   Normal esophagus. No esophageal varices. Stomach empty. Small hiatal hernia. Small fundal diverticulum. Multiple antral erosions extending into the bulb. The patient had 3 prepyloric gastric ulcers with the largest being approximately 2 x 6 mm. These appear to be benign lesions. No obvious infiltrating process. Pylorus patent. Examination of bulb and second portion we will bulbar erosions only  THERAPEUTIC / DIAGNOSTIC MANEUVERS PERFORMED:  Biopsies the gastric ulcers running because of taken for histology.   COMPLICATIONS:  None  IMPRESSION:   Hiatal hernia. Gastric diverticulum. Gastric ulcers with associated erosions. Duodenal erosions. Status post gastric biopsy  RECOMMENDATIONS:  Begin Protonix 40 mg orally twice daily. Avoid  all nonsteroidals aside from one baby aspirin daily.  Followup on pathology. See colonoscopy report.    _______________________________ R. Garfield Cornea, MD FACP [facility] eSigned:  R. Garfield Cornea, MD FACP [facility] 09/03/2012 8:30 AM     CC:

## 2012-09-03 NOTE — Anesthesia Postprocedure Evaluation (Signed)
Anesthesia Post Note  Patient: Cory Weber  Procedure(s) Performed: Procedure(s) (LRB): COLONOSCOPY WITH PROPOFOL (N/A) ESOPHAGOGASTRODUODENOSCOPY (EGD) WITH PROPOFOL (N/A) BIOPSY (N/A) POLYPECTOMY (N/A)  Anesthesia type: MAC  Patient location: PACU  Post pain: Pain level controlled  Post assessment: Post-op Vital signs reviewed, Patient's Cardiovascular Status Stable, Respiratory Function Stable, Patent Airway, No signs of Nausea or vomiting and Pain level controlled  Last Vitals:  Filed Vitals:   09/03/12 0829  BP: 123/74  Pulse: 73  Temp: 36.7 C  Resp: 20    Post vital signs: Reviewed and stable  Level of consciousness: awake and alert   Complications: No apparent anesthesia complications

## 2012-09-03 NOTE — Anesthesia Preprocedure Evaluation (Signed)
Anesthesia Evaluation  Patient identified by MRN, date of birth, ID band Patient awake    Reviewed: Allergy & Precautions, H&P , NPO status , Patient's Chart, lab work & pertinent test results  Airway Mallampati: II TM Distance: >3 FB Neck ROM: Full    Dental  (+) Edentulous Upper and Edentulous Lower   Pulmonary asthma ,  breath sounds clear to auscultation        Cardiovascular hypertension, Pt. on medications + Peripheral Vascular Disease Rhythm:Regular Rate:Normal     Neuro/Psych  Headaches, CVA, Residual Symptoms    GI/Hepatic (+)     substance abuse  alcohol use, Hepatitis -, C  Endo/Other  diabetes, Type 2, Oral Hypoglycemic Agents  Renal/GU      Musculoskeletal   Abdominal   Peds  Hematology   Anesthesia Other Findings   Reproductive/Obstetrics                           Anesthesia Physical Anesthesia Plan  ASA: III  Anesthesia Plan: MAC   Post-op Pain Management:    Induction: Intravenous  Airway Management Planned: Simple Face Mask  Additional Equipment:   Intra-op Plan:   Post-operative Plan:   Informed Consent: I have reviewed the patients History and Physical, chart, labs and discussed the procedure including the risks, benefits and alternatives for the proposed anesthesia with the patient or authorized representative who has indicated his/her understanding and acceptance.     Plan Discussed with:   Anesthesia Plan Comments:         Anesthesia Quick Evaluation

## 2012-09-03 NOTE — Op Note (Signed)
[facility] [address] [place], [address]   COLONOSCOPY PROCEDURE REPORT  PATIENT: Cory, Weber  MR#:         [id number] BIRTHDATE: [birth year] , 14  yrs. old GENDER: Other ENDOSCOPIST: Bridgette Habermann, MD FACP FACG REFERRED BY:  Iona Beard, M.D. PROCEDURE DATE:  09/03/2012 PROCEDURE:     Colonoscopy with snare polypectomy  INDICATIONS: iron deficiency anemia; Hemoccult positive stool  INFORMED CONSENT:  The risks, benefits, alternatives and imponderables including but not limited to bleeding, perforation as well as the possibility of a missed lesion have been reviewed.  The potential for biopsy, lesion removal, etc. have also been discussed.  Questions have been answered.  All parties agreeable. Please see the history and physical in the medical record for more information.  MEDICATIONS: deep sedation per Dr. [facility]  DESCRIPTION OF PROCEDURE:  After a digital rectal exam was performed, the     colonoscope was advanced from the anus through the rectum and colon to the area of the cecum, ileocecal valve and appendiceal orifice.  The cecum was deeply intubated.  These structures were well-seen and photographed for the record.  From the level of the cecum and ileocecal valve, the scope was slowly and cautiously withdrawn.  The mucosal surfaces were carefully surveyed utilizing scope tip deflection to facilitate fold flattening as needed.  The scope was pulled down into the rectum where a thorough examination including retroflexion was performed.     FINDINGS:  Adequate preparation. Prominent internal hemorrhoids; otherwise, normal rectum. Somewhat tortuous colon. The only mucosal abnormality found was a 4 mm polyp in the base of the cecum.  THERAPEUTIC / DIAGNOSTIC MANEUVERS PERFORMED:    The above-mentioned polyp was cold snare removed.  COMPLICATIONS: None  CECAL WITHDRAWAL TIME:  9 minutes  IMPRESSION:  Colonic  polyp-removed as outlined above. Prominent internal hemorrhoids  RECOMMENDATIONS:   Followup on pathology. See EGD report   _______________________________ eSigned:  R. Garfield Cornea, MD FACP [facility] 09/03/2012 8:34 AM   CC:

## 2012-09-03 NOTE — Anesthesia Procedure Notes (Signed)
Procedure Name: MAC Date/Time: 09/03/2012 7:35 AM Performed by: Vista Deck Pre-anesthesia Checklist: Patient identified, Emergency Drugs available, Suction available, Timeout performed and Patient being monitored Patient Re-evaluated:Patient Re-evaluated prior to inductionOxygen Delivery Method: Non-rebreather mask

## 2012-09-04 ENCOUNTER — Encounter (HOSPITAL_COMMUNITY): Payer: Self-pay | Admitting: Internal Medicine

## 2012-09-04 ENCOUNTER — Encounter: Payer: Self-pay | Admitting: Internal Medicine

## 2012-09-07 ENCOUNTER — Telehealth: Payer: Self-pay

## 2012-09-07 NOTE — Telephone Encounter (Signed)
Tried to call pt- phone number "not available right now"

## 2012-09-07 NOTE — Telephone Encounter (Signed)
Letter from: Daneil Dolin   Reason for Letter: Results Review   Send letter to patient.  Send copy of letter with path to referring provider and PCP.   Cory Weber, needs prevpak or generic equiv x 14 days (hold zocor and protonix - then resume. Need repeqt egd /ov 3 months         Letters

## 2012-09-08 MED ORDER — AMOXICILL-CLARITHRO-LANSOPRAZ PO MISC: Freq: Two times a day (BID) | ORAL | Status: DC

## 2012-09-08 NOTE — Telephone Encounter (Signed)
Tried to call pt again, phone number not available. Called pts sister- Kennyth Lose- (pt gave her name and number last time he was in the office and stated to me that it was ok to give her his medical information) rx sent to pharmacy and she is aware of which medications he needs to hold and will inform him.

## 2012-09-08 NOTE — Telephone Encounter (Signed)
Manuela Schwartz, please nic ov in 47months.

## 2012-09-08 NOTE — Telephone Encounter (Signed)
Reminder in epic °

## 2012-09-23 ENCOUNTER — Telehealth: Payer: Self-pay | Admitting: *Deleted

## 2012-09-23 NOTE — Telephone Encounter (Signed)
Pt called stating he has a question about his protonix it is a 40mg , pt states protonix is making him dizzy and his toes are going num. Please advise [phone]

## 2012-09-23 NOTE — Telephone Encounter (Signed)
Tried to call pt- LMOM 

## 2012-09-23 NOTE — Telephone Encounter (Signed)
Spoke with pt- he is currently in . Visiting his daughter. He stated his symptoms started when he started taking the protonix. Advised him to stop the protonix but if his symptoms didn't go away or he got worse he should go to the ED. Pt verbalized understanding. He said he will be home in about a week or so and he is going to call when he gets home to make an appointment.

## 2012-11-17 ENCOUNTER — Encounter: Payer: Self-pay | Admitting: Internal Medicine

## 2012-11-19 ENCOUNTER — Encounter: Payer: Self-pay | Admitting: Gastroenterology

## 2012-11-19 ENCOUNTER — Ambulatory Visit (INDEPENDENT_AMBULATORY_CARE_PROVIDER_SITE_OTHER): Payer: Medicaid Other | Admitting: Gastroenterology

## 2012-11-19 ENCOUNTER — Encounter (INDEPENDENT_AMBULATORY_CARE_PROVIDER_SITE_OTHER): Payer: Self-pay

## 2012-11-19 ENCOUNTER — Other Ambulatory Visit: Payer: Self-pay | Admitting: Internal Medicine

## 2012-11-19 ENCOUNTER — Encounter (HOSPITAL_COMMUNITY): Payer: Self-pay | Admitting: Pharmacy Technician

## 2012-11-19 VITALS — BP 172/104 | HR 61 | Temp 98.0°F | Ht 73.0 in | Wt 211.8 lb

## 2012-11-19 DIAGNOSIS — D509 Iron deficiency anemia, unspecified: Secondary | ICD-10-CM

## 2012-11-19 DIAGNOSIS — K279 Peptic ulcer, site unspecified, unspecified as acute or chronic, without hemorrhage or perforation: Secondary | ICD-10-CM

## 2012-11-19 DIAGNOSIS — B182 Chronic viral hepatitis C: Secondary | ICD-10-CM

## 2012-11-19 DIAGNOSIS — K5900 Constipation, unspecified: Secondary | ICD-10-CM

## 2012-11-19 MED ORDER — LINACLOTIDE 290 MCG PO CAPS: 290.0000 ug | ORAL_CAPSULE | Freq: Every day | ORAL | Status: DC

## 2012-11-19 MED ORDER — ESOMEPRAZOLE MAGNESIUM 40 MG PO CPDR: 40.0000 mg | DELAYED_RELEASE_CAPSULE | Freq: Every day | ORAL | Status: DC

## 2012-11-19 NOTE — Patient Instructions (Signed)
First: Complete blood work today and urine sample. Go home and collect the stool sample, then return it to the lab.  Then you may start taking Nexium each morning, 30 minutes before breakfast. Avoid all BC powders, Goody's powders, Ibuprofen, Advil, Aleve, Motrin. Only use Tylenol products.  I have referred you to the Hepatitis C clinic to discuss when to treat.   We have set you up for a repeat upper endoscopy with Dr. Gala Romney to assess ulcer healing.

## 2012-11-19 NOTE — Assessment & Plan Note (Signed)
+  RNA, genotype 1a. Immune to Hep A and B. [place] on file normal April 2014. Although he admits to cocaine 1 month ago, he is interested in pursuing outpatient treatment. Discussed the importance of drug abstinence. Will refer to establish provider-patient relationship; likely will need to be abstinent from illegal substances for 6 months prior to treatment. Patient is aware. Refer to Hep C clinic.

## 2012-11-19 NOTE — Progress Notes (Signed)
Referring Provider: Iona Beard, MD Primary Care Physician:  Maggie Font, MD Primary GI: Dr. Gala Romney   Chief Complaint  Patient presents with  . Follow-up    HPI:   Mr. Senn presents today to schedule surveillance EGD for PUD secondary to H.pylori. Last EGD in July 2014 with gastric ulcers, duodenal erosions.  Prevpac provided in July 2014. Hx of significant IDA, s/p blood transfusion during GI work-up. Hx of cocaine use, chronic Hep C with +RNA, genotype 1a. Immune to Hep A and B. Normal [place] April 2014.  States he stopped taking Prevpac, wasn't able to complete. No melena. No abdominal pain. No rectal bleeding. Last cocaine about a month ago. Wt gain noted. No jaundice, pruritis, mental status changes. Interested in [place].    Past Medical History  Diagnosis Date  . Diabetes mellitus   . Hypertension   . Hepatitis C     states he was diagnosed in 2007 or 2007 while living in [place], [place]  . CVA (cerebral infarction) 01/2011  . Hyperlipidemia   . Asthma   . PVD (peripheral vascular disease)   . Anemia   . Stroke   . [id number])     Past Surgical History  Procedure Laterality Date  . Liver biopsy  2005    Done in [place], [place]. Chronic hepatitis with mild periportal inflammation, lobular unicellular necrosis and portal fibrosis. Grade 2, stage 1-2.  Marland Kitchen Colonoscopy with propofol N/A 09/03/2012    [id number] polyp-removed as outlined above. Prominent internal hemorrhoids. Tubular adenoma  . Esophagogastroduodenoscopy (egd) with propofol N/A 09/03/2012    [id number] hernia. Gastric diverticulum. Gastric ulcers with associated erosions. Duodenal erosions. Status post gastric biopsy. H.PYLORI gastritis   . Esophageal biopsy N/A 09/03/2012    Procedure: BIOPSY;  Surgeon: Daneil Dolin, MD;  Location: [facility];  Service: Endoscopy;  Laterality: N/A;  gastric and gastric mucosa  . Polypectomy N/A 09/03/2012    Procedure: POLYPECTOMY;  Surgeon: Daneil Dolin, MD;  Location:  [facility];  Service: Endoscopy;  Laterality: N/A;  cecal polyp    Current Outpatient Prescriptions  Medication Sig Dispense Refill  . aspirin 81 MG tablet Take 81 mg by mouth daily.      Marland Kitchen glipiZIDE (GLUCOTROL) 2.5 mg TABS Take 5 mg by mouth 2 (two) times daily before a meal.       . lisinopril-hydrochlorothiazide (PRINZIDE,ZESTORETIC) 20-12.5 MG per tablet Take 1 tablet by mouth 2 (two) times daily.      . metFORMIN (GLUCOPHAGE) 500 MG tablet Take 500 mg by mouth 2 (two) times daily with a meal.      . simvastatin (ZOCOR) 20 MG tablet Take 20 mg by mouth daily at 6 PM.      . esomeprazole (NEXIUM) 40 MG capsule Take 1 capsule (40 mg total) by mouth daily before breakfast.  30 capsule  3  . Linaclotide (LINZESS) 290 MCG CAPS capsule Take 1 capsule (290 mcg total) by mouth daily. 30 minutes before breakfast.  30 capsule  3   No current facility-administered medications for this visit.    Allergies as of 11/19/2012  . (No Known Allergies)    Family History  Problem Relation Age of Onset  . Breast cancer Mother     deceased  . Cancer Mother   . Diabetes Father   . Hypertension Father   . Heart disease Father     deceased  . Hyperlipidemia Father   . Diabetes Sister   . Hypertension Sister   .  Breast cancer Sister   . Colon cancer Neg Hx   . Liver disease Neg Hx     History   Social History  . Marital Status: Single    Spouse Name: N/A    Number of Children: 1  . Years of Education: N/A   Occupational History  . trying to get disability    Social History Main Topics  . Smoking status: Current Every Day Smoker -- 0.50 packs/day for 20 years    Types: Cigarettes  . Smokeless tobacco: Never Used     Comment: given 1-800-Quit Now  . Alcohol Use: 0.0 oz/week     Comment: Drinks beer occasionally, once or twice a month. Never heavy.  last ETOH 1 week ago  . Drug Use: Yes    Special: Cocaine     Comment: last used a month ago.   Marland Kitchen Sexual Activity: None   Other Topics  Concern  . None   Social History Narrative  . None    Review of Systems: As mentioned in HPI.   Physical Exam: BP 172/104  Pulse 61  Temp(Src) 98 F (36.7 C) (Oral)  Ht 6\' 1"  (1.854 m)  Wt 211 lb 12.8 oz (96.072 kg)  BMI 27.95 kg/m2 General:   Alert and oriented. No distress noted. Pleasant and cooperative.  Head:  Normocephalic and atraumatic. Eyes:  Conjuctiva clear without scleral icterus. Mouth:  Oral mucosa pink and moist. Good dentition. No lesions. Neck:  Supple, without mass or thyromegaly. Heart:  S1, S2 present without murmurs, rubs, or gallops. Regular rate and rhythm. Abdomen:  +BS, soft, non-tender and non-distended. No rebound or guarding. No HSM or masses noted. Msk:  Symmetrical without gross deformities. Normal posture. Extremities:  Without edema. Neurologic:  Alert and  oriented x4;  grossly normal neurologically. Skin:  Intact without significant lesions or rashes. Cervical Nodes:  No significant cervical adenopathy. Psych:  Alert and cooperative. Normal mood and affect.

## 2012-11-19 NOTE — Assessment & Plan Note (Signed)
TCS up-to-date. Continue Linzess.

## 2012-11-19 NOTE — Assessment & Plan Note (Signed)
Likely secondary to H.pylori. Failed treatment, which we were not aware of. Needs surveillance EGD to assess for ulcer healing now. Due to history of cocaine, will need to be done in the OR and have negative drug screen prior.  Obtain H.pylori stool antigen Restart PPI: Nexium samples and rx provided once daily for now Proceed with upper endoscopy in the near future with Dr. Gala Romney. The risks, benefits, and alternatives have been discussed in detail with patient. They have stated understanding and desire to proceed.  PROPOFOL due to history of cocaine

## 2012-11-19 NOTE — Assessment & Plan Note (Signed)
Likely secondary to PUD. Recheck CBC, ferritin, iron now.

## 2012-11-23 NOTE — Progress Notes (Signed)
cc'd to pcp 

## 2012-11-23 NOTE — Progress Notes (Signed)
Patient has an appointment on 12/30/12 at the [facility] at 10:30

## 2012-11-25 NOTE — Patient Instructions (Signed)
20    Your procedure is scheduled on: 12/03/2012  Report to [facility] at 6:30    AM.  Call this number if you have problems the morning of surgery: [phone]   Remember:   Do not drink or eat food:After Midnight.    Clear liquids include soda, tea, black coffee, apple or grape juice, broth.  Take these medicines the morning of surgery with A SIP OF WATER: Nexium and lisinopril   Do not wear jewelry, make-up or nail polish.  Do not wear lotions, powders, or perfumes. You may wear deodorant.  Do not shave 48 hours prior to surgery. Men may shave face and neck.  Do not bring valuables to the hospital.  Contacts, dentures or bridgework may not be worn into surgery.  Leave suitcase in the car. After surgery it may be brought to your room.  For patients admitted to the hospital, checkout time is 11:00 AM the day of discharge.   Patients discharged the day of surgery will not be allowed to drive home.  Name and phone number of your driver:    Please read over the following fact sheets that you were given: Pain Booklet, Lab Information and Anesthesia Post-op Instructions   Endoscopy Care After Please read the instructions outlined below and refer to this sheet in the next few weeks. These discharge instructions provide you with general information on caring for yourself after you leave the hospital. Your doctor may also give you specific instructions. While your treatment has been planned according to the most current medical practices available, unavoidable complications occasionally occur. If you have any problems or questions after discharge, please call your doctor. HOME CARE INSTRUCTIONS Activity  You may resume your regular activity but move at a slower pace for the next 24 hours.   Take frequent rest periods for the next 24 hours.   Walking will help expel (get rid of) the air and reduce the bloated feeling in your abdomen.   No driving for 24 hours (because of the anesthesia  (medicine) used during the test).   You may shower.   Do not sign any important legal documents or operate any machinery for 24 hours (because of the anesthesia used during the test).  Nutrition  Drink plenty of fluids.   You may resume your normal diet.   Begin with a light meal and progress to your normal diet.   Avoid alcoholic beverages for 24 hours or as instructed by your caregiver.  Medications You may resume your normal medications unless your caregiver tells you otherwise. What you can expect today  You may experience abdominal discomfort such as a feeling of fullness or "gas" pains.   You may experience a sore throat for 2 to 3 days. This is normal. Gargling with salt water may help this.  Follow-up Your doctor will discuss the results of your test with you. SEEK IMMEDIATE MEDICAL CARE IF:  You have excessive nausea (feeling sick to your stomach) and/or vomiting.   You have severe abdominal pain and distention (swelling).   You have trouble swallowing.   You have a temperature over 100 F (37.8 C).   You have rectal bleeding or vomiting of blood.  Document Released: 09/12/2003 Document Revised: 01/17/2011 Document Reviewed: 03/25/2007

## 2012-11-26 ENCOUNTER — Encounter (HOSPITAL_COMMUNITY)
Admission: RE | Admit: 2012-11-26 | Discharge: 2012-11-26 | Disposition: A | Discharge status: Home / Self Care | Payer: Medicaid Other | Source: Ambulatory Visit | Attending: Internal Medicine | Admitting: Internal Medicine

## 2012-11-27 NOTE — Patient Instructions (Signed)
Cory Weber  11/27/2012   Your procedure is scheduled on:  12/03/2012  Report to [facility] at  68  AM.  Call this number if you have problems the morning of surgery: [phone]   Remember:   Do not eat food or drink liquids after midnight.   Take these medicines the morning of surgery with A SIP OF WATER: nexium, lisinopril   Do not wear jewelry, make-up or nail polish.  Do not wear lotions, powders, or perfumes.   Do not shave 48 hours prior to surgery. Men may shave face and neck.  Do not bring valuables to the hospital.  [facility] is not responsible  for any belongings or valuables.               Contacts, dentures or bridgework may not be worn into surgery.  Leave suitcase in the car. After surgery it may be brought to your room.  For patients admitted to the hospital, discharge time is determined by your treatment team.               Patients discharged the day of surgery will not be allowed to drive home.  Name and phone number of your driver: family  Special Instructions: Shower using CHG 2 nights before surgery and the night before surgery.  If you shower the day of surgery use CHG.  Use special wash - you have one bottle of CHG for all showers.  You should use approximately 1/3 of the bottle for each shower.   Please read over the following fact sheets that you were given: Pain Booklet, Coughing and Deep Breathing, MRSA Information, Surgical Site Infection Prevention, Anesthesia Post-op Instructions and Care and Recovery After Surgery Esophagogastroduodenoscopy Esophagogastroduodenoscopy (EGD) is a procedure to examine the lining of the esophagus, stomach, and first part of the small intestine (duodenum). A long, flexible, lighted tube with a camera attached (endoscope) is inserted down the throat to view these organs. This procedure is done to detect problems or abnormalities, such as inflammation, bleeding, ulcers, or growths, in order to treat them. The  procedure lasts about 5 20 minutes. It is usually an outpatient procedure, but it may need to be performed in emergency cases in the hospital. LET YOUR CAREGIVER KNOW ABOUT:   Allergies to food or medicine.  All medicines you are taking, including vitamins, herbs, eyedrops, and over-the-counter medicines and creams.  Use of steroids (by mouth or creams).  Previous problems you or members of your family have had with the use of anesthetics.  Any blood disorders you have.  Previous surgeries you have had.  Other health problems you have.  Possibility of pregnancy, if this applies. RISKS AND COMPLICATIONS  Generally, EGD is a safe procedure. However, as with any procedure, complications can occur. Possible complications include:  Infection.  Bleeding.  Tearing (perforation) of the esophagus, stomach, or duodenum.  Difficulty breathing or not being able to breath.  Excessive sweating.  Spasms of the larynx.  Slowed heartbeat.  Low blood pressure. BEFORE THE PROCEDURE  Do not eat or drink anything for 6 8 hours before the procedure or as directed by your caregiver.  Ask your caregiver about changing or stopping your regular medicines.  If you wear dentures, be prepared to remove them before the procedure.  Arrange for someone to drive you home after the procedure. PROCEDURE   A vein will be accessed to give medicines and fluids. A medicine to relax you (sedative)  and a pain reliever will be given through that access into the vein.  A numbing medicine (local anesthetic) may be sprayed on your throat for comfort and to stop you from gagging or coughing.  A mouth guard may be placed in your mouth to protect your teeth and to keep you from biting on the endoscope.  You will be asked to lie on your left side.  The endoscope is inserted down your throat and into the esophagus, stomach, and duodenum.  Air is put through the endoscope to allow your caregiver to view the  lining of your esophagus clearly.  The esophagus, stomach, and duodenum is then examined. During the exam, your caregiver may:  Remove tissue to be examined under a microscope (biopsy) for inflammation, infection, or other medical problems.  Remove growths.  Remove objects (foreign bodies) that are stuck.  Treat any bleeding with medicines or other devices that stop tissues from bleeding (hot cauters, clipping devices).  Widen (dilate) or stretch narrowed areas of the esophagus and stomach.  The endoscope will then be withdrawn. AFTER THE PROCEDURE  You will be taken to a recovery area to be monitored. You will be able to go home once you are stable and alert.  Do not eat or drink anything until the local anesthetic and numbing medicines have worn off. You may choke.  It is normal to feel bloated, have pain with swallowing, or have a sore throat for a short time. This will wear off.  Your caregiver should be able to discuss his or her findings with you. It will take longer to discuss the test results if any biopsies were taken. Document Released: 05/31/2004 Document Revised: 01/15/2012 Document Reviewed: 01/01/2012 [facility] Patient Information 2014 [place], [place]. PATIENT INSTRUCTIONS POST-ANESTHESIA  IMMEDIATELY FOLLOWING SURGERY:  Do not drive or operate machinery for the first twenty four hours after surgery.  Do not make any important decisions for twenty four hours after surgery or while taking narcotic pain medications or sedatives.  If you develop intractable nausea and vomiting or a severe headache please notify your doctor immediately.  FOLLOW-UP:  Please make an appointment with your surgeon as instructed. You do not need to follow up with anesthesia unless specifically instructed to do so.  WOUND CARE INSTRUCTIONS (if applicable):  Keep a dry clean dressing on the anesthesia/puncture wound site if there is drainage.  Once the wound has quit draining you may leave it open  to air.  Generally you should leave the bandage intact for twenty four hours unless there is drainage.  If the epidural site drains for more than 36-48 hours please call the anesthesia department.  QUESTIONS?:  Please feel free to call your physician or the hospital operator if you have any questions, and they will be happy to assist you.

## 2012-11-30 ENCOUNTER — Encounter (HOSPITAL_COMMUNITY)
Admission: RE | Admit: 2012-11-30 | Discharge: 2012-11-30 | Disposition: A | Discharge status: Home / Self Care | Payer: Medicaid Other | Source: Ambulatory Visit | Attending: Internal Medicine | Admitting: Internal Medicine

## 2012-12-01 ENCOUNTER — Encounter (HOSPITAL_COMMUNITY): Payer: Self-pay | Admitting: Pharmacy Technician

## 2012-12-01 NOTE — Patient Instructions (Addendum)
ALGERNON COCCARO  12/01/2012   Your procedure is scheduled on:  Thursday, October 23  Report to [facility] at [place] AM.  Call this number if you have problems the morning of surgery: [phone]   Remember:   Do not eat food or drink liquids after midnight.   Take these medicines the morning of surgery with A SIP OF WATER: Nexum, lisinopril   Do not wear jewelry, make-up or nail polish.  Do not wear lotions, powders, or perfumes. You may wear deodorant.  Do not shave 48 hours prior to surgery. Men may shave face and neck.  Do not bring valuables to the hospital.  [facility] is not responsible  for any belongings or valuables.               Contacts, dentures or bridgework may not be worn into surgery.  Leave suitcase in the car. After surgery it may be brought to your room.  For patients admitted to the hospital, discharge time is determined by your  treatment team.               Patients discharged the day of surgery will not be allowed to drive home.  Name and phone number of your driver: family or friend  Special Instructions: N/A   Please read over the following fact sheets that you were given: Pain Booklet, Surgical Site Infection Prevention, Anesthesia Post-op Instructions and Care and Recovery After Surgery  PATIENT INSTRUCTIONS POST-ANESTHESIA  IMMEDIATELY FOLLOWING SURGERY:  Do not drive or operate machinery for the first twenty four hours after surgery.  Do not make any important decisions for twenty four hours after surgery or while taking narcotic pain medications or sedatives.  If you develop intractable nausea and vomiting or a severe headache please notify your doctor immediately.  FOLLOW-UP:  Please make an appointment with your surgeon as instructed. You do not need to follow up with anesthesia unless specifically instructed to do so.  WOUND CARE INSTRUCTIONS (if applicable):  Keep a dry clean dressing on the anesthesia/puncture wound site if there is drainage.   Once the wound has quit draining you may leave it open to air.  Generally you should leave the bandage intact for twenty four hours unless there is drainage.  If the epidural site drains for more than 36-48 hours please call the anesthesia department.  QUESTIONS?:  Please feel free to call your physician or the hospital operator if you have any questions, and they will be happy to assist you.     Upper GI Endoscopy Upper GI endoscopy means using a flexible scope to look at the esophagus, stomach, and upper small bowel. This is done to make a diagnosis in people with heartburn, abdominal pain, or abnormal bleeding. Sometimes an endoscope is needed to remove foreign bodies or food that become stuck in the esophagus; it can also be used to take biopsy samples. For the best results, do not eat or drink for 8 hours before having your upper endoscopy.  To perform the endoscopy, you will probably be sedated and your throat will be numbed with a special spray. The endoscope is then slowly passed down your throat (this will not interfere with your breathing). An endoscopy exam takes 15 to 30 minutes to complete and there is no real pain. Patients rarely remember much about the procedure. The results of the test may take several days if a biopsy or other test is taken.  You may have a sore throat after  an endoscopy exam. Serious complications are very rare. Stick to liquids and soft foods until your pain is better. Do not drive a car or operate any dangerous equipment for at least 24 hours after being sedated. SEEK IMMEDIATE MEDICAL CARE IF:   You have severe throat pain.   You have shortness of breath.   You have bleeding problems.   You have a fever.   You have difficulty recovering from your sedation.  Document Released: 03/07/2004 Document Revised: 01/17/2011 Document Reviewed: 01/31/2008 [facility] Patient Information 2012 [place].

## 2012-12-02 ENCOUNTER — Encounter (HOSPITAL_COMMUNITY): Payer: Self-pay

## 2012-12-02 ENCOUNTER — Encounter (HOSPITAL_COMMUNITY)
Admission: RE | Admit: 2012-12-02 | Discharge: 2012-12-02 | Disposition: A | Discharge status: Home / Self Care | Payer: Medicaid Other | Source: Ambulatory Visit | Attending: Internal Medicine | Admitting: Internal Medicine

## 2012-12-02 LAB — BASIC METABOLIC PANEL
BUN: 11 mg/dL (ref 6–23)
Calcium: 9.4 mg/dL (ref 8.4–10.5)
Creatinine, Ser: 0.96 mg/dL (ref 0.50–1.35)
GFR calc Af Amer: >90 mL/min (ref >90)
GFR calc non Af Amer: 88 mL/min — ABNORMAL LOW (ref >90)
Sodium: 139 mEq/L (ref 135–145)

## 2012-12-02 LAB — HEMOGLOBIN AND HEMATOCRIT, BLOOD
HCT: 34.6 % — ABNORMAL LOW (ref 39.0–52.0)
Hemoglobin: 11.1 g/dL — ABNORMAL LOW (ref 13.0–17.0)

## 2012-12-02 LAB — RAPID URINE DRUG SCREEN, HOSP PERFORMED: Barbiturates: NOT DETECTED

## 2012-12-03 ENCOUNTER — Ambulatory Visit (HOSPITAL_COMMUNITY): Payer: Medicaid Other | Admitting: Anesthesiology

## 2012-12-03 ENCOUNTER — Encounter (HOSPITAL_COMMUNITY): Payer: Self-pay | Admitting: *Deleted

## 2012-12-03 ENCOUNTER — Encounter (HOSPITAL_COMMUNITY)
Admission: RE | Disposition: A | Discharge status: Home Health | Payer: Self-pay | Source: Ambulatory Visit | Attending: Internal Medicine

## 2012-12-03 ENCOUNTER — Encounter (HOSPITAL_COMMUNITY): Payer: Medicaid Other | Admitting: Anesthesiology

## 2012-12-03 ENCOUNTER — Ambulatory Visit (HOSPITAL_COMMUNITY)
Admission: RE | Admit: 2012-12-03 | Discharge: 2012-12-03 | Disposition: A | Discharge status: Home Health | Payer: Medicaid Other | Source: Ambulatory Visit | Attending: Internal Medicine | Admitting: Internal Medicine

## 2012-12-03 DIAGNOSIS — Z8711 Personal history of peptic ulcer disease: Secondary | ICD-10-CM

## 2012-12-03 DIAGNOSIS — K314 Gastric diverticulum: Secondary | ICD-10-CM

## 2012-12-03 DIAGNOSIS — I10 Essential (primary) hypertension: Secondary | ICD-10-CM | POA: Insufficient documentation

## 2012-12-03 DIAGNOSIS — E119 Type 2 diabetes mellitus without complications: Secondary | ICD-10-CM | POA: Insufficient documentation

## 2012-12-03 DIAGNOSIS — K2960 Other gastritis without bleeding: Secondary | ICD-10-CM

## 2012-12-03 DIAGNOSIS — K259 Gastric ulcer, unspecified as acute or chronic, without hemorrhage or perforation: Secondary | ICD-10-CM | POA: Insufficient documentation

## 2012-12-03 DIAGNOSIS — Z01812 Encounter for preprocedural laboratory examination: Secondary | ICD-10-CM | POA: Insufficient documentation

## 2012-12-03 HISTORY — PX: BIOPSY: SHX5522

## 2012-12-03 HISTORY — PX: ESOPHAGOGASTRODUODENOSCOPY (EGD) WITH PROPOFOL: SHX5813

## 2012-12-03 LAB — GLUCOSE, CAPILLARY: Glucose-Capillary: 145 mg/dL — ABNORMAL HIGH (ref 70–99)

## 2012-12-03 SURGERY — ESOPHAGOGASTRODUODENOSCOPY (EGD) WITH PROPOFOL
Anesthesia: Monitor Anesthesia Care | Site: Mouth

## 2012-12-03 MED ORDER — LACTATED RINGERS IV SOLN
INTRAVENOUS | Status: DC
  Administered 2012-12-03: 07:00:00 via INTRAVENOUS

## 2012-12-03 MED ORDER — ONDANSETRON HCL 4 MG/2ML IJ SOLN
INTRAMUSCULAR | Status: AC
  Filled 2012-12-03: qty 2

## 2012-12-03 MED ORDER — FENTANYL CITRATE 0.05 MG/ML IJ SOLN: 25.0000 ug | INTRAMUSCULAR | Status: DC | PRN

## 2012-12-03 MED ORDER — ONDANSETRON HCL 4 MG/2ML IJ SOLN: 4.0000 mg | Freq: Once | INTRAMUSCULAR | Status: DC | PRN

## 2012-12-03 MED ORDER — FENTANYL CITRATE 0.05 MG/ML IJ SOLN
INTRAMUSCULAR | Status: DC | PRN
  Administered 2012-12-03: 50 ug via INTRAVENOUS

## 2012-12-03 MED ORDER — PROPOFOL 10 MG/ML IV BOLUS
INTRAVENOUS | Status: DC | PRN
  Administered 2012-12-03: 20 mg via INTRAVENOUS

## 2012-12-03 MED ORDER — FENTANYL CITRATE 0.05 MG/ML IJ SOLN
INTRAMUSCULAR | Status: AC
  Filled 2012-12-03: qty 2

## 2012-12-03 MED ORDER — PROPOFOL INFUSION 10 MG/ML OPTIME
INTRAVENOUS | Status: DC | PRN
  Administered 2012-12-03: 125 ug/kg/min via INTRAVENOUS

## 2012-12-03 MED ORDER — BUTAMBEN-TETRACAINE-BENZOCAINE 2-2-14 % EX AERO
1.0000 | INHALATION_SPRAY | Freq: Once | CUTANEOUS | Status: AC
  Administered 2012-12-03: 1 via TOPICAL
  Filled 2012-12-03: qty 56

## 2012-12-03 MED ORDER — BUTAMBEN-TETRACAINE-BENZOCAINE 2-2-14 % EX AERO
INHALATION_SPRAY | CUTANEOUS | Status: AC
  Filled 2012-12-03: qty 56

## 2012-12-03 MED ORDER — STERILE WATER FOR IRRIGATION IR SOLN
Status: DC | PRN
  Administered 2012-12-03: 08:00:00

## 2012-12-03 MED ORDER — MIDAZOLAM HCL 2 MG/2ML IJ SOLN
1.0000 mg | INTRAMUSCULAR | Status: DC | PRN
Start: 2012-12-03 — End: 2012-12-03
  Administered 2012-12-03: 2 mg via INTRAVENOUS

## 2012-12-03 MED ORDER — ONDANSETRON HCL 4 MG/2ML IJ SOLN
4.0000 mg | Freq: Once | INTRAMUSCULAR | Status: AC
  Administered 2012-12-03: 4 mg via INTRAVENOUS

## 2012-12-03 MED ORDER — PROPOFOL 10 MG/ML IV BOLUS
INTRAVENOUS | Status: AC
  Filled 2012-12-03: qty 20

## 2012-12-03 MED ORDER — MIDAZOLAM HCL 2 MG/2ML IJ SOLN
INTRAMUSCULAR | Status: AC
  Filled 2012-12-03: qty 2

## 2012-12-03 MED ORDER — FENTANYL CITRATE 0.05 MG/ML IJ SOLN
25.0000 ug | INTRAMUSCULAR | Status: AC
  Administered 2012-12-03 (×2): 25 ug via INTRAVENOUS

## 2012-12-03 MED ORDER — LIDOCAINE HCL (PF) 1 % IJ SOLN
INTRAMUSCULAR | Status: AC
  Filled 2012-12-03: qty 5

## 2012-12-03 SURGICAL SUPPLY — 18 items
BLOCK BITE 60FR ADLT L/F BLUE (MISCELLANEOUS) ×2 IMPLANT
CONT PRE FILLED 20ML (MISCELLANEOUS) ×2 IMPLANT
DEVICE CLIP HEMOSTAT 235CM (CLIP) IMPLANT
ELECT REM PT RETURN 9FT ADLT (ELECTROSURGICAL)
ELECTRODE REM PT RTRN 9FT ADLT (ELECTROSURGICAL) IMPLANT
FLOOR PAD 36X40 (MISCELLANEOUS) ×2
FORCEPS BIOP RAD 4 LRG CAP 4 (CUTTING FORCEPS) ×2 IMPLANT
MANIFOLD NEPTUNE II (INSTRUMENTS) ×2 IMPLANT
NEEDLE SCLEROTHERAPY 25GX240 (NEEDLE) IMPLANT
PAD FLOOR 36X40 (MISCELLANEOUS) ×1 IMPLANT
PROBE APC STR FIRE (PROBE) IMPLANT
PROBE INJECTION GOLD (MISCELLANEOUS)
PROBE INJECTION GOLD 7FR (MISCELLANEOUS) IMPLANT
SNARE ROTATE MED OVAL 20MM (MISCELLANEOUS) IMPLANT
SYR 50ML LL SCALE MARK (SYRINGE) ×2 IMPLANT
TUBING ENDO SMARTCAP PENTAX (MISCELLANEOUS) ×2 IMPLANT
TUBING IRRIGATION ENDOGATOR (MISCELLANEOUS) ×2 IMPLANT
WATER STERILE IRR 1000ML POUR (IV SOLUTION) ×2 IMPLANT

## 2012-12-03 NOTE — H&P (View-Only) (Signed)
Referring Provider: Iona Beard, MD Primary Care Physician:  Maggie Font, MD Primary GI: Dr. Gala Romney   Chief Complaint  Patient presents with  . Follow-up    HPI:   Mr. Manford presents today to schedule surveillance EGD for PUD secondary to H.pylori. Last EGD in July 2014 with gastric ulcers, duodenal erosions.  Prevpac provided in July 2014. Hx of significant IDA, s/p blood transfusion during GI work-up. Hx of cocaine use, chronic Hep C with +RNA, genotype 1a. Immune to Hep A and B. Normal [place] April 2014.  States he stopped taking Prevpac, wasn't able to complete. No melena. No abdominal pain. No rectal bleeding. Last cocaine about a month ago. Wt gain noted. No jaundice, pruritis, mental status changes. Interested in [place].    Past Medical History  Diagnosis Date  . Diabetes mellitus   . Hypertension   . Hepatitis C     states he was diagnosed in 2007 or 2007 while living in [place], [place]  . CVA (cerebral infarction) 01/2011  . Hyperlipidemia   . Asthma   . PVD (peripheral vascular disease)   . Anemia   . Stroke   . [id number])     Past Surgical History  Procedure Laterality Date  . Liver biopsy  2005    Done in [place], [place]. Chronic hepatitis with mild periportal inflammation, lobular unicellular necrosis and portal fibrosis. Grade 2, stage 1-2.  Marland Kitchen Colonoscopy with propofol N/A 09/03/2012    [id number] polyp-removed as outlined above. Prominent internal hemorrhoids. Tubular adenoma  . Esophagogastroduodenoscopy (egd) with propofol N/A 09/03/2012    [id number] hernia. Gastric diverticulum. Gastric ulcers with associated erosions. Duodenal erosions. Status post gastric biopsy. H.PYLORI gastritis   . Esophageal biopsy N/A 09/03/2012    Procedure: BIOPSY;  Surgeon: Daneil Dolin, MD;  Location: [facility];  Service: Endoscopy;  Laterality: N/A;  gastric and gastric mucosa  . Polypectomy N/A 09/03/2012    Procedure: POLYPECTOMY;  Surgeon: Daneil Dolin, MD;  Location:  [facility];  Service: Endoscopy;  Laterality: N/A;  cecal polyp    Current Outpatient Prescriptions  Medication Sig Dispense Refill  . aspirin 81 MG tablet Take 81 mg by mouth daily.      Marland Kitchen glipiZIDE (GLUCOTROL) 2.5 mg TABS Take 5 mg by mouth 2 (two) times daily before a meal.       . lisinopril-hydrochlorothiazide (PRINZIDE,ZESTORETIC) 20-12.5 MG per tablet Take 1 tablet by mouth 2 (two) times daily.      . metFORMIN (GLUCOPHAGE) 500 MG tablet Take 500 mg by mouth 2 (two) times daily with a meal.      . simvastatin (ZOCOR) 20 MG tablet Take 20 mg by mouth daily at 6 PM.      . esomeprazole (NEXIUM) 40 MG capsule Take 1 capsule (40 mg total) by mouth daily before breakfast.  30 capsule  3  . Linaclotide (LINZESS) 290 MCG CAPS capsule Take 1 capsule (290 mcg total) by mouth daily. 30 minutes before breakfast.  30 capsule  3   No current facility-administered medications for this visit.    Allergies as of 11/19/2012  . (No Known Allergies)    Family History  Problem Relation Age of Onset  . Breast cancer Mother     deceased  . Cancer Mother   . Diabetes Father   . Hypertension Father   . Heart disease Father     deceased  . Hyperlipidemia Father   . Diabetes Sister   . Hypertension Sister   .  Breast cancer Sister   . Colon cancer Neg Hx   . Liver disease Neg Hx     History   Social History  . Marital Status: Single    Spouse Name: N/A    Number of Children: 1  . Years of Education: N/A   Occupational History  . trying to get disability    Social History Main Topics  . Smoking status: Current Every Day Smoker -- 0.50 packs/day for 20 years    Types: Cigarettes  . Smokeless tobacco: Never Used     Comment: given 1-800-Quit Now  . Alcohol Use: 0.0 oz/week     Comment: Drinks beer occasionally, once or twice a month. Never heavy.  last ETOH 1 week ago  . Drug Use: Yes    Special: Cocaine     Comment: last used a month ago.   Marland Kitchen Sexual Activity: None   Other Topics  Concern  . None   Social History Narrative  . None    Review of Systems: As mentioned in HPI.   Physical Exam: BP 172/104  Pulse 61  Temp(Src) 98 F (36.7 C) (Oral)  Ht 6\' 1"  (1.854 m)  Wt 211 lb 12.8 oz (96.072 kg)  BMI 27.95 kg/m2 General:   Alert and oriented. No distress noted. Pleasant and cooperative.  Head:  Normocephalic and atraumatic. Eyes:  Conjuctiva clear without scleral icterus. Mouth:  Oral mucosa pink and moist. Good dentition. No lesions. Neck:  Supple, without mass or thyromegaly. Heart:  S1, S2 present without murmurs, rubs, or gallops. Regular rate and rhythm. Abdomen:  +BS, soft, non-tender and non-distended. No rebound or guarding. No HSM or masses noted. Msk:  Symmetrical without gross deformities. Normal posture. Extremities:  Without edema. Neurologic:  Alert and  oriented x4;  grossly normal neurologically. Skin:  Intact without significant lesions or rashes. Cervical Nodes:  No significant cervical adenopathy. Psych:  Alert and cooperative. Normal mood and affect.

## 2012-12-03 NOTE — Anesthesia Preprocedure Evaluation (Signed)
Anesthesia Evaluation  Patient identified by MRN, date of birth, ID band Patient awake    Reviewed: Allergy & Precautions, H&P , NPO status , Patient's Chart, lab work & pertinent test results  Airway Mallampati: II TM Distance: >3 FB Neck ROM: Full    Dental  (+) Edentulous Upper and Edentulous Lower   Pulmonary asthma ,  breath sounds clear to auscultation        Cardiovascular hypertension, Pt. on medications + Peripheral Vascular Disease Rhythm:Regular Rate:Normal     Neuro/Psych  Headaches, CVA, Residual Symptoms    GI/Hepatic (+)     substance abuse  alcohol use, Hepatitis -, C  Endo/Other  diabetes, Type 2, Oral Hypoglycemic Agents  Renal/GU      Musculoskeletal   Abdominal   Peds  Hematology   Anesthesia Other Findings   Reproductive/Obstetrics                           Anesthesia Physical Anesthesia Plan  ASA: III  Anesthesia Plan: MAC   Post-op Pain Management:    Induction: Intravenous  Airway Management Planned: Simple Face Mask  Additional Equipment:   Intra-op Plan:   Post-operative Plan:   Informed Consent: I have reviewed the patients History and Physical, chart, labs and discussed the procedure including the risks, benefits and alternatives for the proposed anesthesia with the patient or authorized representative who has indicated his/her understanding and acceptance.     Plan Discussed with:   Anesthesia Plan Comments:         Anesthesia Quick Evaluation

## 2012-12-03 NOTE — Transfer of Care (Signed)
Immediate Anesthesia Transfer of Care Note  Patient: Cory Weber  Procedure(s) Performed: Procedure(s): ESOPHAGOGASTRODUODENOSCOPY (EGD) WITH PROPOFOL (N/A) BIOPSY (N/A)  Patient Location: PACU  Anesthesia Type:MAC  Level of Consciousness: awake  Airway & Oxygen Therapy: Patient Spontanous Breathing  Post-op Assessment: Report given to PACU RN  Post vital signs: Reviewed and stable  Complications: No apparent anesthesia complications

## 2012-12-03 NOTE — Anesthesia Postprocedure Evaluation (Signed)
  Anesthesia Post-op Note  Patient: Cory Weber  Procedure(s) Performed: Procedure(s): ESOPHAGOGASTRODUODENOSCOPY (EGD) WITH PROPOFOL (N/A) BIOPSY (N/A)  Patient Location: PACU  Anesthesia Type:MAC  Level of Consciousness: awake, alert  and oriented  Airway and Oxygen Therapy: Patient Spontanous Breathing  Post-op Pain: none  Post-op Assessment: Post-op Vital signs reviewed, Patient's Cardiovascular Status Stable, Respiratory Function Stable, Patent Airway and No signs of Nausea or vomiting  Post-op Vital Signs: Reviewed and stable  Complications: No apparent anesthesia complications

## 2012-12-03 NOTE — Interval H&P Note (Signed)
History and Physical Interval Note:  12/03/2012 7:40 AM  [name]  has presented today for surgery, with the diagnosis of PUD AND IDA  The various methods of treatment have been discussed with the patient and family. After consideration of risks, benefits and other options for treatment, the patient has consented to  Procedure(s) with comments: ESOPHAGOGASTRODUODENOSCOPY (EGD) WITH PROPOFOL (N/A) - 8:00 as a surgical intervention .  The patient's history has been reviewed, patient examined, no change in status, stable for surgery.  I have reviewed the patient's chart and labs.  Questions were answered to the patient's satisfaction.     No change. EGD per plan.The risks, benefits, limitations, alternatives and imponderables have been reviewed with the patient. Potential for esophageal dilation, biopsy, etc. have also been reviewed.  Questions have been answered. All parties agreeable.  Manus Rudd

## 2012-12-03 NOTE — Op Note (Addendum)
[facility] [address] [place], [address]   ENDOSCOPY PROCEDURE REPORT  PATIENT: Cory, Weber  MR#: [id number] BIRTHDATE: [birth year] , 62  yrs. old GENDER: Other ENDOSCOPIST: Bridgette Habermann, MD FACP FACG REFERRED BY:  Iona Beard, M.D. PROCEDURE DATE:  12/03/2012 PROCEDURE:     EGD with biopsy  INDICATIONS:     Surveillance EGD; followup previous gastric ulcer. Status post treatment for H. pylori infection (patient took part of the treatment regimen).  H. pylori stool antigen testing previously ordered not done.  INFORMED CONSENT:   The risks, benefits, limitations, alternatives and imponderables have been discussed.  The potential for biopsy, esophogeal dilation, etc. have also been reviewed.  Questions have been answered.  All parties agreeable.  Please see the history and physical in the medical record for more information.  MEDICATIONS:  Deep sedation per Dr. [facility]  DESCRIPTION OF PROCEDURE:   The     endoscope was introduced through the mouth and advanced to the second portion of the duodenum without difficulty or limitations.  The mucosal surfaces were surveyed very carefully during advancement of the scope and upon withdrawal.  Retroflexion view of the proximal stomach and esophagogastric junction was performed.      FINDINGS: Normal esophagus. Stomach empty. Fundal gastric diverticulum present. Patchy erythema;  couple of superficial antral erosions and scar. Previously noted gastric ulcers completely healed. Patent pylorus. Normal first and second portion of the duodenum.  THERAPEUTIC / DIAGNOSTIC MANEUVERS PERFORMED:  biopsies of the eroded antral mucosa taken for histologic study to assess for radiation of H. pylori   COMPLICATIONS:  None  IMPRESSION:   Gastric diverticulum. Gastric erosions and  scar. Previously noted gastric ulcer completely healed. Status post biopsy  RECOMMENDATIONS:  Avoid  nonsteroidal drugs. Continue Nexium 40 mg daily. Followup on pathology.    _______________________________ R. Garfield Cornea, MD FACP [facility] eSigned:  R. Garfield Cornea, MD FACP [facility] 12/03/2012 8:24 AM     CC:

## 2012-12-06 ENCOUNTER — Encounter: Payer: Self-pay | Admitting: Internal Medicine

## 2012-12-08 ENCOUNTER — Encounter (HOSPITAL_COMMUNITY): Payer: Self-pay | Admitting: Internal Medicine

## 2012-12-23 ENCOUNTER — Encounter: Payer: Self-pay | Admitting: Internal Medicine

## 2013-02-11 DIAGNOSIS — I639 Cerebral infarction, unspecified: Secondary | ICD-10-CM

## 2013-02-11 HISTORY — DX: Cerebral infarction, unspecified: I63.9

## 2013-03-23 NOTE — Progress Notes (Signed)
REVIEWED.  

## 2013-05-07 ENCOUNTER — Other Ambulatory Visit: Payer: Self-pay | Admitting: Nurse Practitioner

## 2013-05-07 DIAGNOSIS — C220 Liver cell carcinoma: Secondary | ICD-10-CM

## 2013-05-20 ENCOUNTER — Telehealth: Payer: Self-pay | Admitting: Internal Medicine

## 2013-05-20 NOTE — Telephone Encounter (Signed)
Dawn from [facility] called for AS while she was at lunch in regards to this patient and asked for a return call to [phone]

## 2013-05-20 NOTE — Telephone Encounter (Signed)
Spoke with [employer] Patient had positive drug screen for cocaine. Will need to be clean at least 6 months for Medicaid to consider approving treatment.

## 2013-06-14 ENCOUNTER — Encounter: Payer: Self-pay | Admitting: Cardiovascular Disease

## 2013-06-14 ENCOUNTER — Ambulatory Visit (INDEPENDENT_AMBULATORY_CARE_PROVIDER_SITE_OTHER): Payer: Medicaid Other | Admitting: Cardiovascular Disease

## 2013-06-14 VITALS — BP 138/80 | HR 93 | Ht 73.0 in | Wt 217.1 lb

## 2013-06-14 DIAGNOSIS — I499 Cardiac arrhythmia, unspecified: Secondary | ICD-10-CM

## 2013-06-14 DIAGNOSIS — F1910 Other psychoactive substance abuse, uncomplicated: Secondary | ICD-10-CM

## 2013-06-14 DIAGNOSIS — I10 Essential (primary) hypertension: Secondary | ICD-10-CM

## 2013-06-14 DIAGNOSIS — R011 Cardiac murmur, unspecified: Secondary | ICD-10-CM

## 2013-06-14 NOTE — Progress Notes (Signed)
Patient ID: Cory Weber, male   DOB: 20-May-1950, 63 y.o.   MRN: [id number]       CARDIOLOGY CONSULT NOTE  Patient ID: Cory Weber MRN: [id number] DOB/AGE: [birth year] 63 y.o.  Admit date: (Not on file) Primary Physician Maggie Font, MD  Reason for Consultation: arrhythmia  HPI: The patient is a 63 year old male with a history of tobacco and cocaine use, hypertension, hyperlipidemia, diabetes mellitus, and hepatitis C. He was noted to be in an irregular rhythm at a recent office visit with his hepatologist. He has used intravenous drugs but he says it has been at least 15-20 years ago. He apparently did not have an ECG performed at the office visit where an arrhythmia was diagnosed. An ECG in the office today shows normal sinus rhythm with a heart rate of 63 beats per minute. Intervals and axis are normal. The patient denies any symptoms of chest pain, palpitations, lightheadedness, dizziness, leg swelling, PND, and syncope. He sometimes gets short of breath but attributes this to his tobacco use. He sleeps with the head of the bed elevated for comfort.    No Known Allergies  Current Outpatient Prescriptions  Medication Sig Dispense Refill  . aspirin 81 MG tablet Take 81 mg by mouth daily.      Marland Kitchen esomeprazole (NEXIUM) 40 MG capsule Take 1 capsule (40 mg total) by mouth daily before breakfast.  30 capsule  3  . glipiZIDE (GLUCOTROL) 2.5 mg TABS Take 5 mg by mouth 2 (two) times daily before a meal.       . Linaclotide (LINZESS) 290 MCG CAPS capsule Take 1 capsule (290 mcg total) by mouth daily. 30 minutes before breakfast.  30 capsule  3  . lisinopril-hydrochlorothiazide (PRINZIDE,ZESTORETIC) 20-12.5 MG per tablet Take 1 tablet by mouth 2 (two) times daily.      . metFORMIN (GLUCOPHAGE) 500 MG tablet Take 500 mg by mouth 2 (two) times daily with a meal.      . simvastatin (ZOCOR) 20 MG tablet Take 20 mg by mouth daily at 6 PM.       No current facility-administered  medications for this visit.    Past Medical History  Diagnosis Date  . Diabetes mellitus   . Hypertension   . Hepatitis C     states he was diagnosed in 2007 or 2007 while living in [place], [place]  . CVA (cerebral infarction) 01/2011  . Hyperlipidemia   . Asthma   . PVD (peripheral vascular disease)   . Anemia   . Stroke   . [id number])     Past Surgical History  Procedure Laterality Date  . Liver biopsy  2005    Done in [place], [place]. Chronic hepatitis with mild periportal inflammation, lobular unicellular necrosis and portal fibrosis. Grade 2, stage 1-2.  Marland Kitchen Colonoscopy with propofol N/A 09/03/2012    [id number] polyp-removed as outlined above. Prominent internal hemorrhoids. Tubular adenoma  . Esophagogastroduodenoscopy (egd) with propofol N/A 09/03/2012    [id number] hernia. Gastric diverticulum. Gastric ulcers with associated erosions. Duodenal erosions. Status post gastric biopsy. H.PYLORI gastritis   . Esophageal biopsy N/A 09/03/2012    Procedure: BIOPSY;  Surgeon: Daneil Dolin, MD;  Location: [facility];  Service: Endoscopy;  Laterality: N/A;  gastric and gastric mucosa  . Polypectomy N/A 09/03/2012    Procedure: POLYPECTOMY;  Surgeon: Daneil Dolin, MD;  Location: [facility];  Service: Endoscopy;  Laterality: N/A;  cecal polyp  . Esophagogastroduodenoscopy (egd) with propofol N/A 12/03/2012  Procedure: ESOPHAGOGASTRODUODENOSCOPY (EGD) WITH PROPOFOL;  Surgeon: Daneil Dolin, MD;  Location: [facility];  Service: Endoscopy;  Laterality: N/A;  . Esophageal biopsy N/A 12/03/2012    Procedure: BIOPSY;  Surgeon: Daneil Dolin, MD;  Location: [facility];  Service: Endoscopy;  Laterality: N/A;    History   Social History  . Marital Status: Single    Spouse Name: N/A    Number of Children: 1  . Years of Education: N/A   Occupational History  . trying to get disability    Social History Main Topics  . Smoking status: Current Every Day Smoker -- 0.50 packs/day for 20 years     Types: Cigarettes  . Smokeless tobacco: Never Used     Comment: given 1-800-Quit Now  . Alcohol Use: 0.0 oz/week     Comment: Drinks beer occasionally, once or twice a month. Never heavy.  11/30/2012 last alcohol  . Drug Use: Yes    Special: Cocaine     Comment: occasionally-lAugust  . Sexual Activity: Not on file   Other Topics Concern  . Not on file   Social History Narrative  . No narrative on file     No family history of premature CAD in 1st degree relatives. Father had heart disease in late 29's, died in his 49's.  Prior to Admission medications   Medication Sig Start Date End Date Taking? Authorizing Provider  aspirin 81 MG tablet Take 81 mg by mouth daily.   Yes Historical Provider, MD  esomeprazole (NEXIUM) 40 MG capsule Take 1 capsule (40 mg total) by mouth daily before breakfast. 11/19/12  Yes Orvil Feil, NP  glipiZIDE (GLUCOTROL) 2.5 mg TABS Take 5 mg by mouth 2 (two) times daily before a meal.    Yes Nimish Luther Parody, MD  Linaclotide (LINZESS) 290 MCG CAPS capsule Take 1 capsule (290 mcg total) by mouth daily. 30 minutes before breakfast. 11/19/12  Yes Orvil Feil, NP  lisinopril-hydrochlorothiazide (PRINZIDE,ZESTORETIC) 20-12.5 MG per tablet Take 1 tablet by mouth 2 (two) times daily.   Yes Historical Provider, MD  metFORMIN (GLUCOPHAGE) 500 MG tablet Take 500 mg by mouth 2 (two) times daily with a meal.   Yes Nimish Luther Parody, MD  simvastatin (ZOCOR) 20 MG tablet Take 20 mg by mouth daily at 6 PM.   Yes Nimish Luther Parody, MD     Review of systems complete and found to be negative unless listed above in HPI     Physical exam Blood pressure 152/79, pulse 93, height 6\' 1"  (1.854 m), weight 217 lb 1.9 oz (98.485 kg). General: NAD Neck: No JVD, no thyromegaly or thyroid nodule.  Lungs: Clear to auscultation bilaterally with normal respiratory effort. CV: Nondisplaced PMI.  Heart regular S1/S2, no [id number], II/VI systolic murmur over RUSB and along left sternal border.   No peripheral edema.  No carotid bruit.  Normal pedal pulses.  Abdomen: Soft, nontender, no hepatosplenomegaly, no distention.  Skin: Intact without lesions or rashes.  Neurologic: Alert and oriented x 3.  Psych: Normal affect. Extremities: No clubbing or cyanosis.  HEENT: Normal.   Labs:   Lab Results  Component Value Date   WBC 6.9 07/20/2012   HGB 11.1* 12/02/2012   HCT 34.6* 12/02/2012   MCV 70.7* 07/20/2012   PLT 353 07/20/2012   No results found for this basename: NA, K, CL, CO2, BUN, CREATININE, CALCIUM, LABALBU, PROT, BILITOT, ALKPHOS, ALT, AST, GLUCOSE,  in the last 168 hours No results found for this  basename: CKTOTAL, CKMB, CKMBINDEX, TROPONINI    Lab Results  Component Value Date   CHOL 183 02/04/2011   Lab Results  Component Value Date   HDL 43 02/04/2011   Lab Results  Component Value Date   LDLCALC 112* 02/04/2011   Lab Results  Component Value Date   TRIG 139 02/04/2011   Lab Results  Component Value Date   CHOLHDL 4.3 02/04/2011   No results found for this basename: LDLDIRECT         Studies: No results found.  ASSESSMENT AND PLAN: 1. Arrhythmia: He appears to be entirely asymptomatic. He is currently in a regular rhythm. He does have risk factors for arrhythmia which include diabetes mellitus, drug use, and hypertension which can distort the electrical architecture of the heart. I will obtain a one-week event monitor to evaluate for any suspicious dysrhythmias.  2. Murmur: Given his prior history of intravenous drug use, he is at risk for tricuspid regurgitation and valvular disease. He may have some aortic sclerosis as well. I will obtain an echocardiogram for further clarification.  3. HTN: Initial blood pressure was 152/79, with a repeat blood pressure of 138/80. I will continue to monitor this.  Dispo: f/u 6-8 weeks.  Signed: Kate Sable, M.D., F.A.C.C.  06/14/2013, 8:43 AM

## 2013-06-14 NOTE — Patient Instructions (Signed)
Your physician recommends that you schedule a follow-up appointment in: 8 wwks   Your physician has requested that you have an echocardiogram. Echocardiography is a painless test that uses sound waves to create images of your heart. It provides your doctor with information about the size and shape of your heart and how well your heart's chambers and valves are working. This procedure takes approximately one hour. There are no restrictions for this procedure.   Your physician has recommended that you wear an event monitor. Event monitors are medical devices that record the heart's electrical activity. Doctors most often [place] these monitors to diagnose arrhythmias. Arrhythmias are problems with the speed or rhythm of the heartbeat. The monitor is a small, portable device. You can wear one while you do your normal daily activities. This is usually used to diagnose what is causing palpitations/syncope (passing out).   Your physician recommends that you continue on your current medications as directed. Please refer to the Current Medication list given to you today.    Thank you for choosing [facility] !

## 2013-06-18 ENCOUNTER — Other Ambulatory Visit (HOSPITAL_COMMUNITY): Payer: Medicaid Other

## 2013-06-21 DIAGNOSIS — R002 Palpitations: Secondary | ICD-10-CM

## 2013-06-25 ENCOUNTER — Ambulatory Visit (HOSPITAL_COMMUNITY)
Admission: RE | Admit: 2013-06-25 | Discharge: 2013-06-25 | Disposition: A | Discharge status: Home / Self Care | Payer: Medicaid Other | Source: Ambulatory Visit | Attending: Cardiovascular Disease | Admitting: Cardiovascular Disease

## 2013-06-25 DIAGNOSIS — I10 Essential (primary) hypertension: Secondary | ICD-10-CM | POA: Insufficient documentation

## 2013-06-25 DIAGNOSIS — I517 Cardiomegaly: Secondary | ICD-10-CM

## 2013-06-25 DIAGNOSIS — E785 Hyperlipidemia, unspecified: Secondary | ICD-10-CM | POA: Insufficient documentation

## 2013-06-25 DIAGNOSIS — R011 Cardiac murmur, unspecified: Secondary | ICD-10-CM

## 2013-06-25 DIAGNOSIS — E119 Type 2 diabetes mellitus without complications: Secondary | ICD-10-CM | POA: Insufficient documentation

## 2013-06-25 DIAGNOSIS — F1910 Other psychoactive substance abuse, uncomplicated: Secondary | ICD-10-CM

## 2013-06-25 DIAGNOSIS — I499 Cardiac arrhythmia, unspecified: Secondary | ICD-10-CM | POA: Insufficient documentation

## 2013-06-25 DIAGNOSIS — F17200 Nicotine dependence, unspecified, uncomplicated: Secondary | ICD-10-CM | POA: Insufficient documentation

## 2013-06-25 NOTE — Progress Notes (Signed)
*  PRELIMINARY RESULTS* Echocardiogram 2D Echocardiogram has been performed.  Cory Weber 06/25/2013, 10:18 AM

## 2013-06-29 ENCOUNTER — Telehealth: Payer: Self-pay

## 2013-06-29 NOTE — Telephone Encounter (Signed)
Message regarding echo results by dr.koneswaran given to pt,will fu at next visit injune

## 2013-07-13 ENCOUNTER — Other Ambulatory Visit: Payer: Self-pay | Admitting: *Deleted

## 2013-07-13 DIAGNOSIS — I499 Cardiac arrhythmia, unspecified: Secondary | ICD-10-CM

## 2013-07-15 ENCOUNTER — Ambulatory Visit
Admission: RE | Admit: 2013-07-15 | Discharge: 2013-07-15 | Disposition: A | Discharge status: Home / Self Care | Payer: Medicaid Other | Source: Ambulatory Visit | Attending: Nurse Practitioner | Admitting: Nurse Practitioner

## 2013-07-15 DIAGNOSIS — C220 Liver cell carcinoma: Secondary | ICD-10-CM

## 2013-08-03 ENCOUNTER — Encounter: Payer: Self-pay | Admitting: Cardiovascular Disease

## 2013-08-03 ENCOUNTER — Ambulatory Visit: Payer: Medicaid Other | Admitting: Cardiovascular Disease

## 2013-08-06 ENCOUNTER — Encounter: Payer: Self-pay | Admitting: *Deleted

## 2013-08-30 ENCOUNTER — Other Ambulatory Visit: Payer: Self-pay | Admitting: Gastroenterology

## 2013-08-31 ENCOUNTER — Encounter: Payer: Self-pay | Admitting: *Deleted

## 2013-09-21 ENCOUNTER — Ambulatory Visit: Payer: Medicaid Other | Admitting: Cardiovascular Disease

## 2013-11-03 ENCOUNTER — Encounter: Payer: Self-pay | Admitting: Cardiovascular Disease

## 2013-11-03 ENCOUNTER — Ambulatory Visit (INDEPENDENT_AMBULATORY_CARE_PROVIDER_SITE_OTHER): Payer: Medicaid Other | Admitting: Cardiovascular Disease

## 2013-11-03 ENCOUNTER — Encounter (INDEPENDENT_AMBULATORY_CARE_PROVIDER_SITE_OTHER): Payer: Self-pay

## 2013-11-03 VITALS — BP 150/98 | HR 82 | Ht 73.0 in | Wt 212.0 lb

## 2013-11-03 DIAGNOSIS — I499 Cardiac arrhythmia, unspecified: Secondary | ICD-10-CM

## 2013-11-03 DIAGNOSIS — I10 Essential (primary) hypertension: Secondary | ICD-10-CM

## 2013-11-03 DIAGNOSIS — R011 Cardiac murmur, unspecified: Secondary | ICD-10-CM

## 2013-11-03 MED ORDER — AMLODIPINE BESYLATE 5 MG PO TABS: 5.0000 mg | ORAL_TABLET | Freq: Every day | ORAL | Status: DC

## 2013-11-03 NOTE — Patient Instructions (Signed)
Your physician wants you to follow-up in: 6 months with Dr. Bronson Ing. You will receive a reminder letter in the mail two months in advance. If you don't receive a letter, please call our office to schedule the follow-up appointment.  Your physician has recommended you make the following change in your medication:   START AMLODIPINE 5 MG DAILY  Thank you for choosing [facility]!!

## 2013-11-03 NOTE — Progress Notes (Signed)
Patient ID: Cory Weber, male   DOB: 07-02-1950, 63 y.o.   MRN: [id number]      SUBJECTIVE: The patient returns for followup for cardiovascular testing performed for the evaluation of a purported arrhythmia and murmur. Echocardiography demonstrated normal left ventricular systolic function, EF [id number], grade 2 diastolic dysfunction, no significant valvular regurgitation or stenosis with aortic valve sclerosis. An event monitoring demonstrated normal sinus rhythm with no evidence of arrhythmia. He denies palpitations, leg swelling, chest pain and shortness of breath.  Review of Systems: As per "subjective", otherwise negative.  No Known Allergies  Current Outpatient Prescriptions  Medication Sig Dispense Refill  . aspirin 81 MG tablet Take 81 mg by mouth daily.      Marland Kitchen esomeprazole (NEXIUM) 40 MG capsule TAKE ONE CAPSULE BY MOUTH DAILY BEFORE BREAKFAST.  30 capsule  11  . glipiZIDE (GLUCOTROL) 2.5 mg TABS Take 5 mg by mouth 2 (two) times daily before a meal.       . Linaclotide (LINZESS) 290 MCG CAPS capsule Take 1 capsule (290 mcg total) by mouth daily. 30 minutes before breakfast.  30 capsule  3  . lisinopril-hydrochlorothiazide (PRINZIDE,ZESTORETIC) 20-12.5 MG per tablet Take 1 tablet by mouth 2 (two) times daily.      . metFORMIN (GLUCOPHAGE) 500 MG tablet Take 500 mg by mouth 2 (two) times daily with a meal.      . simvastatin (ZOCOR) 20 MG tablet Take 20 mg by mouth daily at 6 PM.       No current facility-administered medications for this visit.    Past Medical History  Diagnosis Date  . Diabetes mellitus   . Hypertension   . Hepatitis C     states he was diagnosed in 2007 or 2007 while living in [place], [place]  . CVA (cerebral infarction) 01/2011  . Hyperlipidemia   . Asthma   . PVD (peripheral vascular disease)   . Anemia   . Stroke   . [id number])     Past Surgical History  Procedure Laterality Date  . Liver biopsy  2005    Done in [place], [place]. Chronic  hepatitis with mild periportal inflammation, lobular unicellular necrosis and portal fibrosis. Grade 2, stage 1-2.  Marland Kitchen Colonoscopy with propofol N/A 09/03/2012    [id number] polyp-removed as outlined above. Prominent internal hemorrhoids. Tubular adenoma  . Esophagogastroduodenoscopy (egd) with propofol N/A 09/03/2012    [id number] hernia. Gastric diverticulum. Gastric ulcers with associated erosions. Duodenal erosions. Status post gastric biopsy. H.PYLORI gastritis   . Esophageal biopsy N/A 09/03/2012    Procedure: BIOPSY;  Surgeon: Daneil Dolin, MD;  Location: [facility];  Service: Endoscopy;  Laterality: N/A;  gastric and gastric mucosa  . Polypectomy N/A 09/03/2012    Procedure: POLYPECTOMY;  Surgeon: Daneil Dolin, MD;  Location: [facility];  Service: Endoscopy;  Laterality: N/A;  cecal polyp  . Esophagogastroduodenoscopy (egd) with propofol N/A 12/03/2012    Procedure: ESOPHAGOGASTRODUODENOSCOPY (EGD) WITH PROPOFOL;  Surgeon: Daneil Dolin, MD;  Location: [facility];  Service: Endoscopy;  Laterality: N/A;  . Esophageal biopsy N/A 12/03/2012    Procedure: BIOPSY;  Surgeon: Daneil Dolin, MD;  Location: [facility];  Service: Endoscopy;  Laterality: N/A;    History   Social History  . Marital Status: Single    Spouse Name: N/A    Number of Children: 1  . Years of Education: N/A   Occupational History  . trying to get disability    Social History Main Topics  . Smoking  status: Current Some Day Smoker -- 0.50 packs/day for 20 years    Types: Cigarettes  . Smokeless tobacco: Never Used     Comment: given 1-800-Quit Now  . Alcohol Use: 0.0 oz/week     Comment: Drinks beer occasionally, once or twice a month. Never heavy.  11/30/2012 last alcohol  . Drug Use: Yes    Special: Cocaine     Comment: occasionally-lAugust  . Sexual Activity: Not on file   Other Topics Concern  . Not on file   Social History Narrative  . No narrative on file     Filed Vitals:   11/03/13 0935  BP: 150/98    Pulse: 82  Height: 6\' 1"  (1.854 m)  Weight: 212 lb (96.163 kg)    PHYSICAL EXAM General: NAD  Neck: No JVD, no thyromegaly or thyroid nodule.  Lungs: Clear to auscultation bilaterally with normal respiratory effort.  CV: Nondisplaced PMI. Heart regular S1/S2, no [id number], II/VI systolic murmur over RUSB and along left sternal border. No peripheral edema. No carotid bruit. Normal pedal pulses.  Abdomen: Soft, nontender, no hepatosplenomegaly, no distention.  Skin: Intact without lesions or rashes.  Neurologic: Alert and oriented x 3.  Psych: Normal affect.  Extremities: No clubbing or cyanosis.  Musculoskeletal: No gross deformities. Normal function. HEENT: Normal.   ECG: Most recent ECG reviewed.      ASSESSMENT AND PLAN: 1. Arrhythmia: One-week event monitoring did not reveal any arrhythmias. Denies palpitations. Will continue to clinically monitor. 2. Murmur: Echo demonstrated aortic valve sclerosis without stenosis, and no significant valvular regurgitation. 3. Essential HTN: Remains elevated on current dose of lisinopril-HCTZ. Will add amlodipine 5 mg daily.   Dispo: f/u 6 months.  Kate Sable, M.D., F.A.C.C.

## 2013-11-23 ENCOUNTER — Inpatient Hospital Stay (HOSPITAL_COMMUNITY): Payer: Medicaid Other

## 2013-11-23 ENCOUNTER — Emergency Department (HOSPITAL_COMMUNITY): Payer: Medicaid Other

## 2013-11-23 ENCOUNTER — Encounter (HOSPITAL_COMMUNITY): Payer: Self-pay | Admitting: Emergency Medicine

## 2013-11-23 ENCOUNTER — Inpatient Hospital Stay (HOSPITAL_COMMUNITY)
Admission: EM | Admit: 2013-11-23 | Discharge: 2013-11-29 | DRG: 460 | Disposition: A | Discharge status: Home Health | Payer: Medicaid Other | Attending: Neurological Surgery | Admitting: Neurological Surgery

## 2013-11-23 DIAGNOSIS — B1920 Unspecified viral hepatitis C without hepatic coma: Secondary | ICD-10-CM | POA: Diagnosis present

## 2013-11-23 DIAGNOSIS — D509 Iron deficiency anemia, unspecified: Secondary | ICD-10-CM | POA: Diagnosis present

## 2013-11-23 DIAGNOSIS — IMO0002 Reserved for concepts with insufficient information to code with codable children: Secondary | ICD-10-CM

## 2013-11-23 DIAGNOSIS — R5081 Fever presenting with conditions classified elsewhere: Secondary | ICD-10-CM | POA: Diagnosis not present

## 2013-11-23 DIAGNOSIS — R509 Fever, unspecified: Secondary | ICD-10-CM

## 2013-11-23 DIAGNOSIS — Z7982 Long term (current) use of aspirin: Secondary | ICD-10-CM | POA: Diagnosis not present

## 2013-11-23 DIAGNOSIS — M545 Low back pain, unspecified: Secondary | ICD-10-CM

## 2013-11-23 DIAGNOSIS — M4806 Spinal stenosis, lumbar region: Secondary | ICD-10-CM | POA: Diagnosis present

## 2013-11-23 DIAGNOSIS — M5116 Intervertebral disc disorders with radiculopathy, lumbar region: Secondary | ICD-10-CM | POA: Diagnosis present

## 2013-11-23 DIAGNOSIS — I639 Cerebral infarction, unspecified: Secondary | ICD-10-CM

## 2013-11-23 DIAGNOSIS — M5441 Lumbago with sciatica, right side: Secondary | ICD-10-CM

## 2013-11-23 DIAGNOSIS — M6281 Muscle weakness (generalized): Secondary | ICD-10-CM | POA: Diagnosis present

## 2013-11-23 DIAGNOSIS — Z8673 Personal history of transient ischemic attack (TIA), and cerebral infarction without residual deficits: Secondary | ICD-10-CM

## 2013-11-23 DIAGNOSIS — R29898 Other symptoms and signs involving the musculoskeletal system: Secondary | ICD-10-CM | POA: Diagnosis present

## 2013-11-23 DIAGNOSIS — E1165 Type 2 diabetes mellitus with hyperglycemia: Secondary | ICD-10-CM | POA: Diagnosis present

## 2013-11-23 DIAGNOSIS — Z720 Tobacco use: Secondary | ICD-10-CM | POA: Diagnosis present

## 2013-11-23 DIAGNOSIS — D649 Anemia, unspecified: Secondary | ICD-10-CM

## 2013-11-23 DIAGNOSIS — S92353A Displaced fracture of fifth metatarsal bone, unspecified foot, initial encounter for closed fracture: Secondary | ICD-10-CM | POA: Diagnosis present

## 2013-11-23 DIAGNOSIS — Z22322 Carrier or suspected carrier of Methicillin resistant Staphylococcus aureus: Secondary | ICD-10-CM

## 2013-11-23 DIAGNOSIS — I10 Essential (primary) hypertension: Secondary | ICD-10-CM | POA: Diagnosis present

## 2013-11-23 DIAGNOSIS — Z981 Arthrodesis status: Secondary | ICD-10-CM

## 2013-11-23 DIAGNOSIS — M5416 Radiculopathy, lumbar region: Secondary | ICD-10-CM | POA: Diagnosis present

## 2013-11-23 DIAGNOSIS — F1910 Other psychoactive substance abuse, uncomplicated: Secondary | ICD-10-CM

## 2013-11-23 DIAGNOSIS — I739 Peripheral vascular disease, unspecified: Secondary | ICD-10-CM | POA: Diagnosis present

## 2013-11-23 DIAGNOSIS — E785 Hyperlipidemia, unspecified: Secondary | ICD-10-CM | POA: Diagnosis present

## 2013-11-23 DIAGNOSIS — R200 Anesthesia of skin: Secondary | ICD-10-CM

## 2013-11-23 DIAGNOSIS — M48061 Spinal stenosis, lumbar region without neurogenic claudication: Secondary | ICD-10-CM | POA: Diagnosis present

## 2013-11-23 DIAGNOSIS — W1839XA Other fall on same level, initial encounter: Secondary | ICD-10-CM | POA: Diagnosis present

## 2013-11-23 DIAGNOSIS — F17210 Nicotine dependence, cigarettes, uncomplicated: Secondary | ICD-10-CM | POA: Diagnosis present

## 2013-11-23 LAB — URINALYSIS, ROUTINE W REFLEX MICROSCOPIC
Bilirubin Urine: NEGATIVE
Glucose, UA: 250 mg/dL — AB
HGB URINE DIPSTICK: NEGATIVE
Ketones, ur: NEGATIVE mg/dL
LEUKOCYTES UA: NEGATIVE
Nitrite: NEGATIVE
PROTEIN: NEGATIVE mg/dL
Specific Gravity, Urine: 1.01 (ref 1.005–1.030)
UROBILINOGEN UA: 0.2 mg/dL (ref 0.0–1.0)
pH: 5.5 (ref 5.0–8.0)

## 2013-11-23 LAB — CBC WITH DIFFERENTIAL/PLATELET
BASOS ABS: 0 10*3/uL (ref 0.0–0.1)
Basophils Relative: 0 % (ref 0–1)
Eosinophils Absolute: 0.1 10*3/uL (ref 0.0–0.7)
Eosinophils Relative: 1 % (ref 0–5)
HCT: 25.8 % — ABNORMAL LOW (ref 39.0–52.0)
HEMOGLOBIN: 7.7 g/dL — AB (ref 13.0–17.0)
Lymphocytes Relative: 20 % (ref 12–46)
Lymphs Abs: 1.6 10*3/uL (ref 0.7–4.0)
MCH: 22.8 pg — AB (ref 26.0–34.0)
MCHC: 29.8 g/dL — ABNORMAL LOW (ref 30.0–36.0)
MCV: 76.3 fL — ABNORMAL LOW (ref 78.0–100.0)
MONOS PCT: 11 % (ref 3–12)
Monocytes Absolute: 0.9 10*3/uL (ref 0.1–1.0)
NEUTROS ABS: 5.2 10*3/uL (ref 1.7–7.7)
NEUTROS PCT: 68 % (ref 43–77)
PLATELETS: 336 10*3/uL (ref 150–400)
RBC: 3.38 MIL/uL — ABNORMAL LOW (ref 4.22–5.81)
RDW: 16.8 % — AB (ref 11.5–15.5)
WBC: 7.7 10*3/uL (ref 4.0–10.5)

## 2013-11-23 LAB — POC OCCULT BLOOD, ED: FECAL OCCULT BLD: NEGATIVE

## 2013-11-23 LAB — GLUCOSE, CAPILLARY
GLUCOSE-CAPILLARY: 139 mg/dL — AB (ref 70–99)
Glucose-Capillary: 176 mg/dL — ABNORMAL HIGH (ref 70–99)

## 2013-11-23 LAB — BASIC METABOLIC PANEL
ANION GAP: 11 (ref 5–15)
BUN: 12 mg/dL (ref 6–23)
CHLORIDE: 98 meq/L (ref 96–112)
CO2: 26 mEq/L (ref 19–32)
Calcium: 9.2 mg/dL (ref 8.4–10.5)
Creatinine, Ser: 0.97 mg/dL (ref 0.50–1.35)
GFR, EST NON AFRICAN AMERICAN: 87 mL/min — AB (ref >90)
Glucose, Bld: 222 mg/dL — ABNORMAL HIGH (ref 70–99)
POTASSIUM: 4.1 meq/L (ref 3.7–5.3)
SODIUM: 135 meq/L — AB (ref 137–147)

## 2013-11-23 LAB — APTT: APTT: 37 s (ref 24–37)

## 2013-11-23 LAB — TROPONIN I: Troponin I: <0.3 ng/mL (ref <0.30)

## 2013-11-23 LAB — PROTIME-INR
INR: 1.09 (ref 0.00–1.49)
Prothrombin Time: 14.2 seconds (ref 11.6–15.2)

## 2013-11-23 LAB — PREPARE RBC (CROSSMATCH)

## 2013-11-23 MED ORDER — LISINOPRIL 20 MG PO TABS
20.0000 mg | ORAL_TABLET | Freq: Two times a day (BID) | ORAL | Status: DC
  Administered 2013-11-23 – 2013-11-29 (×11): 20 mg via ORAL
  Filled 2013-11-23 (×4): qty 1
  Filled 2013-11-23: qty 2
  Filled 2013-11-23 (×5): qty 1
  Filled 2013-11-23: qty 2

## 2013-11-23 MED ORDER — SODIUM CHLORIDE 0.9 % IV SOLN: 250.0000 mL | INTRAVENOUS | Status: DC | PRN

## 2013-11-23 MED ORDER — LINACLOTIDE 290 MCG PO CAPS
290.0000 ug | ORAL_CAPSULE | Freq: Every day | ORAL | Status: DC
  Administered 2013-11-23 – 2013-11-29 (×6): 290 ug via ORAL
  Filled 2013-11-23 (×3): qty 1
  Filled 2013-11-23: qty 2
  Filled 2013-11-23 (×2): qty 1
  Filled 2013-11-23: qty 2

## 2013-11-23 MED ORDER — ASPIRIN 300 MG RE SUPP
300.0000 mg | Freq: Every day | RECTAL | Status: DC
  Filled 2013-11-23 (×2): qty 1

## 2013-11-23 MED ORDER — AMLODIPINE BESYLATE 5 MG PO TABS
5.0000 mg | ORAL_TABLET | Freq: Every day | ORAL | Status: DC
Start: 2013-11-23 — End: 2013-11-29
  Administered 2013-11-23 – 2013-11-29 (×6): 5 mg via ORAL
  Filled 2013-11-23 (×6): qty 1

## 2013-11-23 MED ORDER — GABAPENTIN 300 MG PO CAPS
300.0000 mg | ORAL_CAPSULE | Freq: Two times a day (BID) | ORAL | Status: DC
  Administered 2013-11-23 – 2013-11-29 (×11): 300 mg via ORAL
  Filled 2013-11-23 (×11): qty 1

## 2013-11-23 MED ORDER — SODIUM CHLORIDE 0.9 % IJ SOLN: 3.0000 mL | INTRAMUSCULAR | Status: DC | PRN

## 2013-11-23 MED ORDER — ACETAMINOPHEN 325 MG PO TABS: 650.0000 mg | ORAL_TABLET | ORAL | Status: DC | PRN

## 2013-11-23 MED ORDER — INSULIN ASPART 100 UNIT/ML ~~LOC~~ SOLN
0.0000 [IU] | Freq: Every day | SUBCUTANEOUS | Status: DC
Start: 2013-11-23 — End: 2013-11-29
  Administered 2013-11-24: 4 [IU] via SUBCUTANEOUS
  Administered 2013-11-25 – 2013-11-26 (×2): 2 [IU] via SUBCUTANEOUS

## 2013-11-23 MED ORDER — PNEUMOCOCCAL VAC POLYVALENT 25 MCG/0.5ML IJ INJ
0.5000 mL | INJECTION | INTRAMUSCULAR | Status: AC
  Administered 2013-11-24: 0.5 mL via INTRAMUSCULAR
  Filled 2013-11-23: qty 0.5

## 2013-11-23 MED ORDER — SODIUM CHLORIDE 0.9 % IJ SOLN
3.0000 mL | Freq: Two times a day (BID) | INTRAMUSCULAR | Status: DC
  Administered 2013-11-24 (×2): 3 mL via INTRAVENOUS

## 2013-11-23 MED ORDER — INSULIN ASPART 100 UNIT/ML ~~LOC~~ SOLN
0.0000 [IU] | Freq: Three times a day (TID) | SUBCUTANEOUS | Status: DC
  Administered 2013-11-24: 3 [IU] via SUBCUTANEOUS
  Administered 2013-11-24: 5 [IU] via SUBCUTANEOUS
  Administered 2013-11-24: 15 [IU] via SUBCUTANEOUS
  Administered 2013-11-25: 2 [IU] via SUBCUTANEOUS
  Administered 2013-11-25: 8 [IU] via SUBCUTANEOUS
  Administered 2013-11-26: 5 [IU] via SUBCUTANEOUS
  Administered 2013-11-26 (×2): 3 [IU] via SUBCUTANEOUS
  Administered 2013-11-27: 5 [IU] via SUBCUTANEOUS
  Administered 2013-11-27 – 2013-11-28 (×4): 3 [IU] via SUBCUTANEOUS
  Administered 2013-11-28: 5 [IU] via SUBCUTANEOUS
  Administered 2013-11-29 (×2): 3 [IU] via SUBCUTANEOUS

## 2013-11-23 MED ORDER — SODIUM CHLORIDE 0.9 % IV SOLN
10.0000 mL/h | Freq: Once | INTRAVENOUS | Status: AC
  Administered 2013-11-23: 10 mL/h via INTRAVENOUS

## 2013-11-23 MED ORDER — HYDROCHLOROTHIAZIDE 12.5 MG PO CAPS
12.5000 mg | ORAL_CAPSULE | Freq: Two times a day (BID) | ORAL | Status: DC
  Administered 2013-11-23 – 2013-11-29 (×11): 12.5 mg via ORAL
  Filled 2013-11-23 (×11): qty 1

## 2013-11-23 MED ORDER — INFLUENZA VAC SPLIT QUAD 0.5 ML IM SUSY
0.5000 mL | PREFILLED_SYRINGE | INTRAMUSCULAR | Status: AC
  Administered 2013-11-24: 0.5 mL via INTRAMUSCULAR
  Filled 2013-11-23: qty 0.5

## 2013-11-23 MED ORDER — PANTOPRAZOLE SODIUM 40 MG PO TBEC
40.0000 mg | DELAYED_RELEASE_TABLET | Freq: Every day | ORAL | Status: DC
  Administered 2013-11-23 – 2013-11-24 (×2): 40 mg via ORAL
  Filled 2013-11-23 (×2): qty 1

## 2013-11-23 MED ORDER — ACETAMINOPHEN 650 MG RE SUPP: 650.0000 mg | RECTAL | Status: DC | PRN

## 2013-11-23 MED ORDER — SIMVASTATIN 20 MG PO TABS
20.0000 mg | ORAL_TABLET | Freq: Every day | ORAL | Status: DC
  Administered 2013-11-23: 20 mg via ORAL
  Filled 2013-11-23: qty 1

## 2013-11-23 MED ORDER — ASPIRIN 325 MG PO TABS
325.0000 mg | ORAL_TABLET | Freq: Every day | ORAL | Status: DC
  Administered 2013-11-23: 325 mg via ORAL
  Filled 2013-11-23: qty 1

## 2013-11-23 MED ORDER — SENNOSIDES-DOCUSATE SODIUM 8.6-50 MG PO TABS: 1.0000 | ORAL_TABLET | Freq: Every evening | ORAL | Status: DC | PRN

## 2013-11-23 MED ORDER — STROKE: EARLY STAGES OF RECOVERY BOOK
Freq: Once | Status: DC
Start: 2013-11-23 — End: 2013-11-24
  Filled 2013-11-23: qty 1

## 2013-11-23 MED ORDER — LISINOPRIL-HYDROCHLOROTHIAZIDE 20-12.5 MG PO TABS: 1.0000 | ORAL_TABLET | Freq: Two times a day (BID) | ORAL | Status: DC

## 2013-11-23 NOTE — ED Notes (Signed)
Blood consent form signed by pt. Physician has discussed transfusion with pt.

## 2013-11-23 NOTE — ED Provider Notes (Signed)
This chart was scribed for [place], DO by Edison Simon, ED Scribe. This patient was seen in room [facility].  TIME SEEN: 1132  CHIEF COMPLAINT: right leg weakness  HPI: Cory Weber is a 63 y.o. male with history of CVA who presents to the Emergency Department complaining of weakness to his right lower extremity and falling 3 times at his house with onset 2 days ago. He states he is unable to move his right leg but has normal function of his right arm. He states that when he had his previous CVA, he lost function his right leg and arm. He denies any residual deficits since his CVA. He denies striking his upon falling. He reports pain in his right leg due to falling. Denies any neck or back pain. No bowel or bladder incontinence. No urinary retention. He denies chest pain, SOB, fever, chills, nausea, fever, vomiting, blood in stool, or melena.  ROS: See HPI Constitutional: no fever  Eyes: no drainage  ENT: no runny nose   Cardiovascular:  no chest pain  Resp: no SOB  GI: no vomiting GU: no dysuria Integumentary: no rash  Allergy: no hives  Musculoskeletal: no leg swelling, right leg pain Neurological: no slurred speech, weakness in right lower exrtremity ROS otherwise negative    PAST MEDICAL HISTORY/PAST SURGICAL HISTORY:  Past Medical History  Diagnosis Date  . Diabetes mellitus   . Hypertension   . Hepatitis C     states he was diagnosed in 2007 or 2007 while living in [place], [place]  . CVA (cerebral infarction) 01/2011  . Hyperlipidemia   . Asthma   . PVD (peripheral vascular disease)   . Anemia   . Stroke   . Headache(784.0)     MEDICATIONS:  Prior to Admission medications   Medication Sig Start Date End Date Taking? Authorizing Provider  amLODipine (NORVASC) 5 MG tablet Take 1 tablet (5 mg total) by mouth daily. 11/03/13   Herminio Commons, MD  aspirin 81 MG tablet Take 81 mg by mouth daily.    Historical Provider, MD  esomeprazole (NEXIUM) 40 MG capsule  TAKE ONE CAPSULE BY MOUTH DAILY BEFORE BREAKFAST. 08/30/13   Mahala Menghini, PA-C  glipiZIDE (GLUCOTROL) 2.5 mg TABS Take 5 mg by mouth 2 (two) times daily before a meal.     Nimish Luther Parody, MD  Linaclotide (LINZESS) 290 MCG CAPS capsule Take 1 capsule (290 mcg total) by mouth daily. 30 minutes before breakfast. 11/19/12   Orvil Feil, NP  lisinopril-hydrochlorothiazide (PRINZIDE,ZESTORETIC) 20-12.5 MG per tablet Take 1 tablet by mouth 2 (two) times daily.    Historical Provider, MD  metFORMIN (GLUCOPHAGE) 500 MG tablet Take 500 mg by mouth 2 (two) times daily with a meal.    Nimish C Gosrani, MD  simvastatin (ZOCOR) 20 MG tablet Take 20 mg by mouth daily at 6 PM.    Doree Albee, MD    ALLERGIES:  No Known Allergies  SOCIAL HISTORY:  History  Substance Use Topics  . Smoking status: Current Some Day Smoker -- 0.50 packs/day for 20 years    Types: Cigarettes  . Smokeless tobacco: Never Used     Comment: given 1-800-Quit Now  . Alcohol Use: 0.0 oz/week     Comment: occ    FAMILY HISTORY: Family History  Problem Relation Age of Onset  . Breast cancer Mother     deceased  . Cancer Mother   . Diabetes Father   . Hypertension Father   .  Heart disease Father     deceased  . Hyperlipidemia Father   . Diabetes Sister   . Hypertension Sister   . Breast cancer Sister   . Colon cancer Neg Hx   . Liver disease Neg Hx     EXAM: BP 176/112  Pulse 97  Temp(Src) 97.8 F (36.6 C) (Oral)  Resp 20  Ht 6\' 1"  (1.854 m)  Wt 201 lb (91.173 kg)  BMI 26.52 kg/m2  SpO2 94% CONSTITUTIONAL: Alert and oriented and responds appropriately to questions. Well-appearing; well-nourished HEAD: Normocephalic EYES: Conjunctivae clear, PERRL ENT: normal nose; no rhinorrhea; moist mucous membranes; pharynx without lesions noted NECK: Supple, no meningismus, no LAD  CARD: RRR; S1 and S2 appreciated; no murmurs, no clicks, no rubs, no gallops RESP: Normal chest excursion without splinting or  tachypnea; breath sounds clear and equal bilaterally; no wheezes, no rhonchi, no rales,  ABD/GI: Normal bowel sounds; non-distended; soft, non-tender, no rebound, no guarding RECTAL:  Slightly enlarged nontender prostate, normal rectal tone, no gross blood or melena, guaiac negative BACK:  The back appears normal and is non-tender to palpation, there is no CVA tenderness; no midline spinal tenderness or step-off or deformity EXT: no edema; normal capillary refill; no cyanosis;  tenderness to palpation to dorsal lateral right foot without obvious bony deformity, no erythema or warmth, no pain or tenderness over the ankle or ligamentous laxity, 2+DP pulses bilaterally  SKIN: Normal color for age and race; warm NEURO: diminished sensation in RLE, normal sensation through extremities and face otherwise, cranial nerves 2-12 intact, 3/5 strength in RLE but 5/5 in other extremities PSYCH: The patient's mood and manner are appropriate. Grooming and personal hygiene are appropriate.   EKG Interpretation  Date/Time:  Tuesday November 23 2013 11:28:34 EDT Ventricular Rate:  81 PR Interval:  166 QRS Duration: 85 QT Interval:  364 QTC Calculation: 422 R Axis:   84 Text Interpretation:  Sinus rhythm Borderline right axis deviation Nonspecific T abnormalities, lateral leads No significant change since last tracing Confirmed by Kamaya Keckler,  DO, AmeLie Hollars [id number]) on 11/23/2013 11:41:02 AM        MEDICAL DECISION MAKING: Patient here with right lower extremity weakness and numbness. He has had a history of a prior CVA with similar symptoms but had no residual deficits after the stroke. States symptoms have been present for 3 days. He is not a TPA candidate because his outside the TPA window. No back pain, bowel or bladder incontinence, urinary retention. No history of injury to his head or back. He did have a fall secondary to his right lower extremity weakness but is only complaining of right foot pain. We'll obtain a  head CT to rule out infarct or hemorrhage. Will obtain labs, EKG. We'll obtain an x-ray of his right foot.  ED PROGRESS: Patient has a hemoglobin of 7.7. He denies any hematemesis. He is guaiac negative. He has good renal function. Unclear etiology for patient's anemia. Patient's anemia may be contributing to exacerbating his chronic symptoms from his prior CVA this may be contributing to his right lower extremity weakness. Head CT shows no acute abnormality. We'll transfuse 2 units of packed red blood cells. Consented at bedside. Discussed with hospitalist for admission for anemia and possible stroke workup.   I personally performed the services described in this documentation, which was scribed in my presence. The recorded information has been reviewed and is accurate.   [place], DO 11/23/13 1655

## 2013-11-23 NOTE — ED Notes (Signed)
Floor called, pt room assignment being changed. Floor to call back.

## 2013-11-23 NOTE — H&P (Signed)
[facility] History and Physical  Cory Weber [id number] DOB: [birth year] DOA: 11/23/2013  Referring physician: Dr. Leonides Schanz, ER physician PCP: Maggie Font, MD   Chief Complaint: right leg weakness  HPI: Cory Weber is a 63 y.o. male with history of hypertension, diabetes, prior CVA which resulted in right upper lobe from the weakness, according to patient his deficits had resolved. Patient was in his usual state of health when he noted in the past 2-3 days that his right foot was starting to trend when he was walking. He had fallen multiple times. He also complains of lower back pain which radiates down his right leg. He complains of some mild numbness in his right leg. He does not have complaints regarding the remainder of his extremities. He's not had any double vision. He does not notice any difficulty swallowing. He does have some mild dysarthria, but feels this is a chronic finding. He denies any changes in vision, headache, vomiting, diarrhea, abdominal pain or dysuria. He's not had any recent trauma prior to noticing his leg weakness. He was evaluated in the emergency room where CT scan of the head was negative. Because of his prior history of stroke, the hospitalists service was requested to see the patient for admission.  Lab work in the emergency room indicated a hemoglobin of 7.7 which appears to be lower than his prior levels. Review of records indicate that he does have a history of iron deficiency anemia. He does not report any frank melena or hematochezia. Stool occult blood in the emergency room was found to be negative. 2 units PRBC have been ordered by ER physician.   Review of Systems:  Pertinent positives as per HPI, otherwise negative  Past Medical History  Diagnosis Date  . Diabetes mellitus   . Hypertension   . Hepatitis C     states he was diagnosed in 2007 or 2007 while living in [place], [place]  . CVA (cerebral infarction) 01/2011  . Hyperlipidemia    . Asthma   . PVD (peripheral vascular disease)   . Anemia   . Stroke   . [id number]    Past Surgical History  Procedure Laterality Date  . Liver biopsy  2005    Done in [place], [place]. Chronic hepatitis with mild periportal inflammation, lobular unicellular necrosis and portal fibrosis. Grade 2, stage 1-2.  Marland Kitchen Colonoscopy with propofol N/A 09/03/2012    [id number] polyp-removed as outlined above. Prominent internal hemorrhoids. Tubular adenoma  . Esophagogastroduodenoscopy (egd) with propofol N/A 09/03/2012    [id number] hernia. Gastric diverticulum. Gastric ulcers with associated erosions. Duodenal erosions. Status post gastric biopsy. H.PYLORI gastritis   . Esophageal biopsy N/A 09/03/2012    Procedure: BIOPSY;  Surgeon: Daneil Dolin, MD;  Location: [facility];  Service: Endoscopy;  Laterality: N/A;  gastric and gastric mucosa  . Polypectomy N/A 09/03/2012    Procedure: POLYPECTOMY;  Surgeon: Daneil Dolin, MD;  Location: [facility];  Service: Endoscopy;  Laterality: N/A;  cecal polyp  . Esophagogastroduodenoscopy (egd) with propofol N/A 12/03/2012    Procedure: ESOPHAGOGASTRODUODENOSCOPY (EGD) WITH PROPOFOL;  Surgeon: Daneil Dolin, MD;  Location: [facility];  Service: Endoscopy;  Laterality: N/A;  . Esophageal biopsy N/A 12/03/2012    Procedure: BIOPSY;  Surgeon: Daneil Dolin, MD;  Location: [facility];  Service: Endoscopy;  Laterality: N/A;   Social History:  reports that he has been smoking Cigarettes.  He has a 10 pack-year smoking history. He has never used smokeless tobacco. He reports  that he drinks alcohol. He reports that he uses illicit drugs (Cocaine).  No Known Allergies  Family History  Problem Relation Age of Onset  . Breast cancer Mother     deceased  . Cancer Mother   . Diabetes Father   . Hypertension Father   . Heart disease Father     deceased  . Hyperlipidemia Father   . Diabetes Sister   . Hypertension Sister   . Breast cancer Sister   . Colon cancer Neg Hx     . Liver disease Neg Hx      Prior to Admission medications   Medication Sig Start Date End Date Taking? Authorizing Provider  aspirin 81 MG tablet Take 81 mg by mouth daily.   Yes Historical Provider, MD  esomeprazole (NEXIUM) 40 MG capsule TAKE ONE CAPSULE BY MOUTH DAILY BEFORE BREAKFAST. 08/30/13  Yes Mahala Menghini, PA-C  gabapentin (NEURONTIN) 300 MG capsule Take 300 mg by mouth 2 (two) times daily.   Yes Historical Provider, MD  glipiZIDE (GLUCOTROL) 2.5 mg TABS Take 5 mg by mouth 2 (two) times daily before a meal.    Yes Nimish C Gosrani, MD  Linaclotide (LINZESS) 290 MCG CAPS capsule Take 1 capsule (290 mcg total) by mouth daily. 30 minutes before breakfast. 11/19/12  Yes Orvil Feil, NP  lisinopril-hydrochlorothiazide (PRINZIDE,ZESTORETIC) 20-12.5 MG per tablet Take 1 tablet by mouth 2 (two) times daily.   Yes Historical Provider, MD  metFORMIN (GLUCOPHAGE) 500 MG tablet Take 500 mg by mouth 2 (two) times daily with a meal.   Yes Nimish C Gosrani, MD  simvastatin (ZOCOR) 20 MG tablet Take 20 mg by mouth daily at 6 PM.   Yes Nimish C Gosrani, MD  amLODipine (NORVASC) 5 MG tablet Take 1 tablet (5 mg total) by mouth daily. 11/03/13   Herminio Commons, MD   Physical Exam: Filed Vitals:   11/23/13 1300 11/23/13 1330 11/23/13 1400 11/23/13 1430  BP: 162/86 157/83 147/88 159/82  Pulse: 64   59  Temp:      TempSrc:      Resp: 19 16 16 17   Height:      Weight:      SpO2: 96%   98%    Wt Readings from Last 3 Encounters:  11/23/13 91.173 kg (201 lb)  11/03/13 96.163 kg (212 lb)  06/14/13 98.485 kg (217 lb 1.9 oz)    General:  Appears calm and comfortable Eyes: PERRL, normal lids, irises & conjunctiva ENT: grossly normal hearing, lips & tongue, speech is mildly dysarthric Neck: no LAD, masses or thyromegaly Cardiovascular: RRR, no m/r/g. No LE edema. Telemetry: SR, no arrhythmias  Respiratory: CTA bilaterally, no w/r/r. Normal respiratory effort. Abdomen: soft, ntnd Skin: no  rash or induration seen on limited exam Musculoskeletal: grossly normal tone BUE/BLE Psychiatric: grossly normal mood and affect, speech fluent and appropriate Neurologic: right ankle is noted to be weak on plantar flexion, He can flex his knee and hip on the bed, but cannot lift his right foot of the bed. Remainder of extremities have normal strength          Labs on Admission:  Basic Metabolic Panel:  Recent Labs Lab 11/23/13 1211  NA 135*  K 4.1  CL 98  CO2 26  GLUCOSE 222*  BUN 12  CREATININE 0.97  CALCIUM 9.2   Liver Function Tests: No results found for this basename: AST, ALT, ALKPHOS, BILITOT, PROT, ALBUMIN,  in the last 168 hours No results  found for this basename: LIPASE, AMYLASE,  in the last 168 hours No results found for this basename: AMMONIA,  in the last 168 hours CBC:  Recent Labs Lab 11/23/13 1211  WBC 7.7  NEUTROABS 5.2  HGB 7.7*  HCT 25.8*  MCV 76.3*  PLT 336   Cardiac Enzymes:  Recent Labs Lab 11/23/13 1211  TROPONINI <0.30    BNP (last 3 results) No results found for this basename: PROBNP,  in the last 8760 hours CBG: No results found for this basename: GLUCAP,  in the last 168 hours  Radiological Exams on Admission: Ct Head Wo Contrast  11/23/2013   CLINICAL DATA:  Weakness and numbness.  Fell 3 x 2 days ago.  EXAM: CT HEAD WITHOUT CONTRAST  TECHNIQUE: Contiguous axial images were obtained from the base of the skull through the vertex without intravenous contrast.  COMPARISON:  MRI 02/06/2011.  CT 02/04/2011.  FINDINGS: No intra-axial or extra-axial pathologic fluid or blood collection. No mass. No hydrocephalus. No hemorrhage. No acute bony abnormality . Mild mucosal thickening right maxillary sinus.  IMPRESSION: No acute abnormality .   Electronically Signed   By: Marcello Moores  Register   On: 11/23/2013 12:38    EKG: Independently reviewed. No acute findings  Assessment/Plan Active Problems:   DM (diabetes mellitus), type 2,  uncontrolled   Accelerated hypertension   Tobacco abuse   Normocytic anemia   Right leg weakness   1. Right leg weakness. Differentials at this point include new CVA versus lumbar spinal process such as radiculopathy. Will continue him on aspirin. MRI of the lumbar spine as well as MRI of the brain will be obtained. He will obtain further studies per stroke orders set. He's had an echocardiogram within the past 6 months so this will not be repeated. Carotid Dopplers will be ordered. 2. Microcytic anemia. Likely related to iron deficiency. 2 units of PRBCs have been ordered in the emergency room. Will order anemia panel. Further workup can likely be pursued as an outpatient, provided he doesn't have any evidence of bleeding. 3. Hypertension. Continue outpatient regimen 4. Diabetes. Hold oral agents and start sliding scale insulin. 5. Tobacco abuse. Counseled on the importance of tobacco cessation.  Code Status: full code DVT Prophylaxis:scd Family Communication: discussed with patient Disposition Plan: discharge home once improved  Time spent: 63mins  MEMON,JEHANZEB [facility] Pager [phone]

## 2013-11-23 NOTE — ED Notes (Signed)
Report given to Rodman Key, RN. Pt ready for transfer.

## 2013-11-23 NOTE — ED Notes (Signed)
Pt reports Sunday fell x 3 because he could not control his r lower leg.  Reports has history of stroke.  Denies other symptoms.

## 2013-11-24 ENCOUNTER — Inpatient Hospital Stay (HOSPITAL_COMMUNITY): Payer: Medicaid Other

## 2013-11-24 ENCOUNTER — Encounter (HOSPITAL_COMMUNITY): Payer: Self-pay | Admitting: Neurological Surgery

## 2013-11-24 DIAGNOSIS — E1165 Type 2 diabetes mellitus with hyperglycemia: Secondary | ICD-10-CM

## 2013-11-24 DIAGNOSIS — R29898 Other symptoms and signs involving the musculoskeletal system: Secondary | ICD-10-CM

## 2013-11-24 DIAGNOSIS — M48061 Spinal stenosis, lumbar region without neurogenic claudication: Secondary | ICD-10-CM | POA: Diagnosis present

## 2013-11-24 DIAGNOSIS — I10 Essential (primary) hypertension: Secondary | ICD-10-CM

## 2013-11-24 LAB — CBC
HEMATOCRIT: 31.7 % — AB (ref 39.0–52.0)
HEMOGLOBIN: 9.8 g/dL — AB (ref 13.0–17.0)
MCH: 24.3 pg — AB (ref 26.0–34.0)
MCHC: 30.9 g/dL (ref 30.0–36.0)
MCV: 78.5 fL (ref 78.0–100.0)
Platelets: 338 10*3/uL (ref 150–400)
RBC: 4.04 MIL/uL — AB (ref 4.22–5.81)
RDW: 17 % — ABNORMAL HIGH (ref 11.5–15.5)
WBC: 7.7 10*3/uL (ref 4.0–10.5)

## 2013-11-24 LAB — TYPE AND SCREEN
ABO/RH(D): O POS
ANTIBODY SCREEN: NEGATIVE
UNIT DIVISION: 0
Unit division: 0

## 2013-11-24 LAB — IRON AND TIBC
Iron: 62 ug/dL (ref 42–135)
Saturation Ratios: 11 % — ABNORMAL LOW (ref 20–55)
TIBC: 567 ug/dL — AB (ref 215–435)
UIBC: 505 ug/dL — ABNORMAL HIGH (ref 125–400)

## 2013-11-24 LAB — FERRITIN: Ferritin: 5 ng/mL — ABNORMAL LOW (ref 22–322)

## 2013-11-24 LAB — GLUCOSE, CAPILLARY
Glucose-Capillary: 243 mg/dL — ABNORMAL HIGH (ref 70–99)
Glucose-Capillary: 167 mg/dL — ABNORMAL HIGH (ref 70–99)
Glucose-Capillary: 434 mg/dL — ABNORMAL HIGH (ref 70–99)
Glucose-Capillary: 343 mg/dL — ABNORMAL HIGH (ref 70–99)

## 2013-11-24 LAB — RETICULOCYTES
RBC.: 4.06 MIL/uL — ABNORMAL LOW (ref 4.22–5.81)
RETIC CT PCT: 1.5 % (ref 0.4–3.1)
Retic Count, Absolute: 60.9 10*3/uL (ref 19.0–186.0)

## 2013-11-24 LAB — LIPID PANEL
Cholesterol: 127 mg/dL (ref 0–200)
HDL: 44 mg/dL (ref >39)
LDL Cholesterol: 62 mg/dL (ref 0–99)
TRIGLYCERIDES: 103 mg/dL (ref <150)
Total CHOL/HDL Ratio: 2.9 RATIO
VLDL: 21 mg/dL (ref 0–40)

## 2013-11-24 LAB — VITAMIN B12: VITAMIN B 12: 493 pg/mL (ref 211–911)

## 2013-11-24 LAB — HEMOGLOBIN A1C
Hgb A1c MFr Bld: 6.5 % — ABNORMAL HIGH (ref <5.7)
Mean Plasma Glucose: 140 mg/dL — ABNORMAL HIGH (ref <117)

## 2013-11-24 LAB — FOLATE: Folate: >20 ng/mL

## 2013-11-24 MED ORDER — POTASSIUM CHLORIDE IN NACL 20-0.9 MEQ/L-% IV SOLN
INTRAVENOUS | Status: DC
  Administered 2013-11-24: 20:00:00 via INTRAVENOUS
  Filled 2013-11-24 (×2): qty 1000

## 2013-11-24 MED ORDER — PHENOL 1.4 % MT LIQD: 1.0000 | OROMUCOSAL | Status: DC | PRN

## 2013-11-24 MED ORDER — ACETAMINOPHEN 650 MG RE SUPP: 650.0000 mg | RECTAL | Status: DC | PRN

## 2013-11-24 MED ORDER — MORPHINE SULFATE 2 MG/ML IJ SOLN
1.0000 mg | INTRAMUSCULAR | Status: DC | PRN
  Administered 2013-11-25 – 2013-11-29 (×5): 2 mg via INTRAVENOUS
  Administered 2013-11-29: 4 mg via INTRAVENOUS
  Administered 2013-11-29: 2 mg via INTRAVENOUS
  Filled 2013-11-24 (×6): qty 1
  Filled 2013-11-24: qty 2

## 2013-11-24 MED ORDER — DEXAMETHASONE SODIUM PHOSPHATE 4 MG/ML IJ SOLN
10.0000 mg | INTRAMUSCULAR | Status: AC
  Administered 2013-11-24: 10 mg via INTRAVENOUS
  Filled 2013-11-24: qty 3

## 2013-11-24 MED ORDER — INSULIN GLARGINE 100 UNIT/ML ~~LOC~~ SOLN
12.0000 [IU] | Freq: Every day | SUBCUTANEOUS | Status: DC
  Administered 2013-11-24 – 2013-11-28 (×5): 12 [IU] via SUBCUTANEOUS
  Filled 2013-11-24 (×6): qty 0.12

## 2013-11-24 MED ORDER — ACETAMINOPHEN 325 MG PO TABS: 650.0000 mg | ORAL_TABLET | ORAL | Status: DC | PRN

## 2013-11-24 MED ORDER — MENTHOL 3 MG MT LOZG: 1.0000 | LOZENGE | OROMUCOSAL | Status: DC | PRN

## 2013-11-24 MED ORDER — PANTOPRAZOLE SODIUM 40 MG IV SOLR
40.0000 mg | Freq: Two times a day (BID) | INTRAVENOUS | Status: DC
  Administered 2013-11-24 – 2013-11-25 (×2): 40 mg via INTRAVENOUS
  Filled 2013-11-24 (×2): qty 40

## 2013-11-24 MED ORDER — OXYCODONE-ACETAMINOPHEN 5-325 MG PO TABS
1.0000 | ORAL_TABLET | ORAL | Status: DC | PRN
  Administered 2013-11-24 – 2013-11-29 (×8): 2 via ORAL
  Filled 2013-11-24 (×9): qty 2

## 2013-11-24 MED ORDER — ONDANSETRON HCL 4 MG/2ML IJ SOLN: 4.0000 mg | INTRAMUSCULAR | Status: DC | PRN

## 2013-11-24 NOTE — Progress Notes (Signed)
Patient transferring to [facility] 4N. Report given to [facility] on 4N. Report given to [facility]. Vitals taken 15 minutes prior to transfer. VSS.

## 2013-11-24 NOTE — Plan of Care (Signed)
Problem: Phase I Progression Outcomes Goal: OOB as tolerated unless otherwise ordered Outcome: Progressing Patient able to stand, but reports pain 9/10 when bearing weight on right leg, per PT report.

## 2013-11-24 NOTE — Progress Notes (Signed)
UR chart review completed.  

## 2013-11-24 NOTE — Progress Notes (Addendum)
Patient Demographics  Cory Weber, is a 63 y.o. male, DOB - [birth year], [id number]  Admit date - 11/23/2013   Admitting Physician Kathie Dike, MD  Outpatient Primary MD for the patient is Maggie Font, MD  LOS - 1   Chief Complaint  Patient presents with  . extremity weakness       Brief narrative: Patient with known history of CVA, diabetes mellitus, hypertension, no residual deficits, presents with significant right lower extremity weakness, MRI of lumbar spine showing significant mass effect on the right L4 nerve root, no acute or subacute CVA on MRI brain, patient is being transferred to [facility] cone 4 ports under Dr. Ronnald Ramp care. Medicine will continue to follow as a consult.  Subjective:   Cory Weber today has, No headache, No chest pain, No abdominal pain - No Nausea, SOB. Patient reports his right lower extremity weakness persist, denies any urinary or stool incontinence.  Assessment & Plan    Active Problems:   DM (diabetes mellitus), type 2, uncontrolled   Accelerated hypertension   Tobacco abuse   Normocytic anemia   Right leg weakness   right lower extremity weakness -Due to severe lumbar radiculopathy, with compression mass effect on the L4 right nerve root, she is being transferred to the [facility] cone under neurosurgical care Dr. Ronnald Ramp, will hold his aspirin, keep him n.p.o., for possible needed surgery, will give him one dose of IV Decadron 10 mg once.as discussed with neurosurgery. -There is no acute finding of MRI brain to suggest acute or subacute stroke, but patient is having significant atherosclerotic disease in multiple vessels in the MRA of the brain, will resume aspirin when patient is more stable.  Anemia -Patient is known to have history of iron deficiency anemia, with extensive workup in the past including colonoscopy and  endoscopy in 2014, showing gastric ulcer and erosions. -Patient is Hemoccult negative x2 in ED, no evidence of active bleed, melena, or hematemesis. -Patient was transfused 2 units packed red blood cell 11/23/2013, will recheck CBC.   Diabetes mellitus -Seems to be controlled while inpatient, continue with insulin sliding scale.  Hypertension -Acceptable, continue with home regimen  History of CVA -No acute findings of acute or subacute stroke an MRI of the brain. - Continue with statin, and resume aspirin once a stable and if no surgical intervention is planned.  Fifth metatarsal fracture - consult orthopedic when patient is at [facility].  Code Status: Full  Family Communication: Spoke with sister Keiwan Gipp  Disposition Plan: transfer to  [place] [address]..   Procedures  None   Consults   Neurosurgery Dr. Ronnald Ramp over the phone.   Medications  Scheduled Meds: .  stroke: mapping our early stages of recovery book   Does not apply Once  . amLODipine  5 mg Oral Daily  . aspirin  300 mg Rectal Daily   Or  . aspirin  325 mg Oral Daily  . gabapentin  300 mg Oral BID  . lisinopril  20 mg Oral BID   And  . hydrochlorothiazide  12.5 mg Oral BID  . Influenza vac split quadrivalent PF  0.5 mL Intramuscular Tomorrow-1000  . insulin aspart  0-15 Units Subcutaneous TID WC  . insulin aspart  0-5 Units Subcutaneous QHS  . Linaclotide  290 mcg Oral Daily  . pantoprazole  40 mg Oral Daily  . pneumococcal 23 valent vaccine  0.5 mL Intramuscular Tomorrow-1000  . simvastatin  20 mg Oral q1800  . sodium chloride  3 mL Intravenous Q12H   Continuous Infusions:  PRN Meds:.sodium chloride, acetaminophen, acetaminophen, senna-docusate, sodium chloride  DVT Prophylaxis  Heparin - SCDs   Lab Results  Component Value Date   PLT 336 11/23/2013    Antibiotics    Anti-infectives   None          Objective:   Filed Vitals:   11/23/13 2100 11/23/13 2117 11/24/13 0013  11/24/13 0528  BP: 153/90 154/82 103/71 138/80  Pulse: 70 63 80 59  Temp: 97.7 F (36.5 C) 98.3 F (36.8 C) 98.2 F (36.8 C) 98.5 F (36.9 C)  TempSrc: Oral Oral Oral Oral  Resp: 18 20 20 15   Height:      Weight:      SpO2: 100%  100% 99%    Wt Readings from Last 3 Encounters:  11/23/13 91.173 kg (201 lb)  11/03/13 96.163 kg (212 lb)  06/14/13 98.485 kg (217 lb 1.9 oz)     Intake/Output Summary (Last 24 hours) at 11/24/13 0853 Last data filed at 11/24/13 0400  Gross per 24 hour  Intake   1185 ml  Output   1700 ml  Net   -515 ml     Physical Exam  Awake Alert, Oriented X 3, No new F.N deficits, Normal affect [name].AT,PERRAL Supple Neck,No JVD, No cervical lymphadenopathy appriciated.  Symmetrical Chest wall movement, Good air movement bilaterally, CTAB RRR,No Gallops,Rubs or new Murmurs, No Parasternal Heave +ve B.Sounds, Abd Soft, No tenderness, No organomegaly appriciated, No rebound - guarding or rigidity. No Cyanosis, Clubbing or edema, No new Rash or bruise   Right lower extremity weakness, 2-3 out 5 and proximal part, unable to lift his right foot of the bed, remainder of extremities have 5 out of 5 strength.  Data Review   Micro Results No results found for this or any previous visit (from the past 240 hour(s)).  Radiology Reports Ct Head Weber Contrast  11/23/2013   CLINICAL DATA:  Weakness and numbness.  Fell 3 x 2 days ago.  EXAM: CT HEAD WITHOUT CONTRAST  TECHNIQUE: Contiguous axial images were obtained from the base of the skull through the vertex without intravenous contrast.  COMPARISON:  MRI 02/06/2011.  CT 02/04/2011.  FINDINGS: No intra-axial or extra-axial pathologic fluid or blood collection. No mass. No hydrocephalus. No hemorrhage. No acute bony abnormality . Mild mucosal thickening right maxillary sinus.  IMPRESSION: No acute abnormality .   Electronically Signed   By: Marcello Moores  Register   On: 11/23/2013 12:38   Cory Weber Contrast  11/24/2013    CLINICAL DATA:  Previous history of stroke. Right leg weakness and numbness acutely.  EXAM: MRI HEAD WITHOUT CONTRAST  MRA HEAD WITHOUT CONTRAST  TECHNIQUE: Multiplanar, multiecho pulse sequences of the brain and surrounding structures were obtained without intravenous contrast. Angiographic images of the head were obtained using MRA technique without contrast.  COMPARISON:  Head CT 11/23/2013. MRI 02/06/2011. CT angiography 02/04/2011.  FINDINGS: MRI HEAD FINDINGS  Diffusion imaging does not show any acute or subacute infarction. There are mild chronic small-vessel changes affecting the pons. No focal cerebellar insult. There are old small vessel infarctions within the thalami, left more than right. There are a few old small vessel infarctions within the  basal ganglia. There are mild chronic small-vessel ischemic changes within the cerebral hemispheric deep white matter, an old infarction affecting the posterior body of the corpus callosum. No cortical or large vessel territory infarction. No mass lesion, hemorrhage, hydrocephalus or extra-axial collection. No pituitary mass. No inflammatory sinus disease. No skull or skullbase lesion.  MRA HEAD FINDINGS  Both internal carotid arteries are patent into the brain. There is apparent narrowing of both internal carotid arteries in the proximal siphon regions with stenosis 70% or greater. The supra clinoid internal carotid arteries are both widely patent. The anterior and middle cerebral vessels are patent bilaterally with mild distal vessel atherosclerotic narrowing and irregularity.  The left vertebral artery is the dominant vessel patent to the basilar. The right vertebral artery terminates in PICA. There is atherosclerotic narrowing and irregularity of the distal left vertebral artery in the proximal basilar artery. Focal proximal basilar stenosis is 50% to 70%. Superior cerebellar and posterior cerebral arteries are patent bilaterally. There is mild atherosclerotic  irregularity of the more distal PCA branches.  IMPRESSION: No acute or subacute infarction. Old small vessel infarctions affecting the thalami, basal ganglia and hemispheric deep white matter.  Severe atherosclerotic narrowing and irregularity in both carotid siphon regions with stenosis 70% or greater bilaterally  Distal vessel atherosclerotic narrowing and irregularity in the anterior circulation.  Right vertebral artery terminates in PICA. This could be congenital or due to distal right vertebral occlusion. Narrowing and irregularity of the distal left vertebral artery in the proximal basilar artery. Proximal basilar stenosis estimated at 50-70%.   Electronically Signed   By: Nelson Chimes M.D.   On: 11/24/2013 08:27   Cory Lumbar Spine Weber Contrast  11/24/2013   CLINICAL DATA:  Chronic low back pain. Two day history of right leg weakness. No known injury. No prior surgery.  EXAM: MRI LUMBAR SPINE WITHOUT CONTRAST  TECHNIQUE: Multiplanar, multisequence Cory imaging of the lumbar spine was performed. No intravenous contrast was administered.  COMPARISON:  07/31/2011  FINDINGS: Normal overall alignment of the lumbar vertebral bodies. There is stable mild degenerative anterolisthesis of L3 the vertebral bodies demonstrate normal marrow signal. The conus medullaris terminates at L2.  The facets are normally aligned. No pars defects. No significant paraspinal or retroperitoneal findings. The bladder is moderately distended.  L1-2:  No significant findings.  L2-3:  No significant findings.  L3-4: Broad-based bulging degenerated annulus with small central and bilateral foraminal disc protrusions. This in combination with short pedicles and advanced facet disease contributes to moderate to moderately severe spinal and bilateral lateral recess stenosis. There is also right greater than left foraminal stenosis. There is mass effect on the right L3 nerve root. There is a free disc fragment down behind the L4 vertebral body  with mass effect on the right side of the thecal sac and on the right L4 nerve root. This is most likely coming from the L3-4 disc space.  L4-5: Bulging degenerated annulus along with a shallow central disc protrusion and facet disease contributes to mild to moderate spinal and bilateral lateral recess stenosis, right greater than left. There is also a broad-based right foraminal disc protrusion contacting and displacing the right L4 nerve root.  L5-S1: Very shallow right paracentral and medial foraminal disc protrusion but no direct neural compression. No spinal stenosis.  IMPRESSION: 1. Moderate to moderately severe spinal and bilateral lateral recess stenosis at L3-4. There are also significant foraminal disc protrusions contributing to bilateral foraminal stenosis, right slightly greater than left. There  is also a free disc fragment down behind the L4 vertebral body with mass effect on the right side of the thecal sac and likely effecting the right L4 nerve root. 2. Moderate multifactorial spinal and lateral recess stenosis at L4-5. There is also a broad-based right foraminal disc protrusion contacting and displacing the right L4 nerve root.   Electronically Signed   By: Kalman Jewels M.D.   On: 11/24/2013 08:26   Dg Chest Port 1 View  11/23/2013   CLINICAL DATA:  Weakness, fall, anemia  EXAM: PORTABLE CHEST - 1 VIEW  COMPARISON:  02/03/2011  FINDINGS: Lungs are clear.  No pleural effusion or pneumothorax.  The heart is normal in size.  IMPRESSION: No evidence of acute cardiopulmonary disease.   Electronically Signed   By: Julian Hy M.D.   On: 11/23/2013 19:19   Dg Foot Complete Right  11/23/2013   CLINICAL DATA:  Patient fell with twisting injury to right foot 2 days prior. Pain laterally  EXAM: RIGHT FOOT COMPLETE - 3+ VIEW  COMPARISON:  None.  FINDINGS: Frontal, oblique, and lateral views were obtained. There is a nondisplaced fracture of the proximal fifth metatarsal in anatomic alignment.  No other fractures. No dislocation. There is mild narrowing of the first MTP joint. No erosive change.  IMPRESSION: Nondisplaced fracture proximal fifth metatarsal, appreciable only on the frontal view. No dislocation. Narrowing first MTP joint.  These results will be called to the ordering clinician or representative by the Radiologist Assistant, and communication documented in the PACS or zVision Dashboard.   Electronically Signed   By: Lowella Grip M.D.   On: 11/23/2013 17:28   Cory Jodene Nam Head/brain Weber Cm  11/24/2013   CLINICAL DATA:  Previous history of stroke. Right leg weakness and numbness acutely.  EXAM: MRI HEAD WITHOUT CONTRAST  MRA HEAD WITHOUT CONTRAST  TECHNIQUE: Multiplanar, multiecho pulse sequences of the brain and surrounding structures were obtained without intravenous contrast. Angiographic images of the head were obtained using MRA technique without contrast.  COMPARISON:  Head CT 11/23/2013. MRI 02/06/2011. CT angiography 02/04/2011.  FINDINGS: MRI HEAD FINDINGS  Diffusion imaging does not show any acute or subacute infarction. There are mild chronic small-vessel changes affecting the pons. No focal cerebellar insult. There are old small vessel infarctions within the thalami, left more than right. There are a few old small vessel infarctions within the basal ganglia. There are mild chronic small-vessel ischemic changes within the cerebral hemispheric deep white matter, an old infarction affecting the posterior body of the corpus callosum. No cortical or large vessel territory infarction. No mass lesion, hemorrhage, hydrocephalus or extra-axial collection. No pituitary mass. No inflammatory sinus disease. No skull or skullbase lesion.  MRA HEAD FINDINGS  Both internal carotid arteries are patent into the brain. There is apparent narrowing of both internal carotid arteries in the proximal siphon regions with stenosis 70% or greater. The supra clinoid internal carotid arteries are both widely  patent. The anterior and middle cerebral vessels are patent bilaterally with mild distal vessel atherosclerotic narrowing and irregularity.  The left vertebral artery is the dominant vessel patent to the basilar. The right vertebral artery terminates in PICA. There is atherosclerotic narrowing and irregularity of the distal left vertebral artery in the proximal basilar artery. Focal proximal basilar stenosis is 50% to 70%. Superior cerebellar and posterior cerebral arteries are patent bilaterally. There is mild atherosclerotic irregularity of the more distal PCA branches.  IMPRESSION: No acute or subacute infarction. Old small vessel infarctions  affecting the thalami, basal ganglia and hemispheric deep white matter.  Severe atherosclerotic narrowing and irregularity in both carotid siphon regions with stenosis 70% or greater bilaterally  Distal vessel atherosclerotic narrowing and irregularity in the anterior circulation.  Right vertebral artery terminates in PICA. This could be congenital or due to distal right vertebral occlusion. Narrowing and irregularity of the distal left vertebral artery in the proximal basilar artery. Proximal basilar stenosis estimated at 50-70%.   Electronically Signed   By: Nelson Chimes M.D.   On: 11/24/2013 08:27    CBC  Recent Labs Lab 11/23/13 1211  WBC 7.7  HGB 7.7*  HCT 25.8*  PLT 336  MCV 76.3*  MCH 22.8*  MCHC 29.8*  RDW 16.8*  LYMPHSABS 1.6  MONOABS 0.9  EOSABS 0.1  BASOSABS 0.0    Chemistries   Recent Labs Lab 11/23/13 1211  NA 135*  K 4.1  CL 98  CO2 26  GLUCOSE 222*  BUN 12  CREATININE 0.97  CALCIUM 9.2   ------------------------------------------------------------------------------------------------------------------ estimated creatinine clearance is 90.4 ml/min (by C-G formula based on Cr of 0.97). ------------------------------------------------------------------------------------------------------------------ No results found for this  basename: HGBA1C,  in the last 72 hours ------------------------------------------------------------------------------------------------------------------  Recent Labs  11/24/13 0335  CHOL 127  HDL 44  LDLCALC 62  TRIG 103  CHOLHDL 2.9   ------------------------------------------------------------------------------------------------------------------ No results found for this basename: TSH, T4TOTAL, FREET3, T3FREE, THYROIDAB,  in the last 72 hours ------------------------------------------------------------------------------------------------------------------  Recent Labs  11/24/13 0335  RETICCTPCT 1.5    Coagulation profile  Recent Labs Lab 11/23/13 1211  INR 1.09    No results found for this basename: DDIMER,  in the last 72 hours  Cardiac Enzymes  Recent Labs Lab 11/23/13 1211  TROPONINI <0.30   ------------------------------------------------------------------------------------------------------------------ No components found with this basename: POCBNP,      Time Spent in minutes   45 minutes   Kharis Lapenna M.D on 11/24/2013 at 8:53 AM  Between 7am to 7pm - Pager - [phone]  After 7pm go to [email or web address] - password [email or web address]  And look for the night coverage person covering for me after hours  [facility] Office  [phone]   **Disclaimer: This note may have been dictated with voice recognition software. Similar sounding words can inadvertently be transcribed and this note may contain transcription errors which may not have been corrected upon publication of note.**

## 2013-11-24 NOTE — H&P (Signed)
Subjective: Patient is a 63 y.o. male who complains of  A three-day history of right leg pain with right leg weakness. He has a history of spondylolisthesis at L3-4 and had a previous disc rupture with a superior free fragment at L3-4 on the right 2 years ago. Onset of symptoms was 3 days ago, rapidly worsening since that time.  Onset was not related to no known injury. The pain is rated severe, and is located at the across the lower back and radiates to the right lower extremity with associated numbness and tingling. He has weakness around the knee. The pain is described as aching and occurs all day. The symptoms have been progressive. Symptoms are exacerbated by exercise and standing. The patient was admitted to an outside hospital from the emergency department for pain control and workup. MRI showed spondylolisthesis L3-4 spinal stenosis and an inferior free fragment compressing the right L4 nerve root. He has a history of chronic iron deficiency anemia and had a hemoglobin of 7.7. He was transfused 2 units of packed red blood cells and this posttransfusion CBC showed a hemoglobin that was greater than 9. This has been worked up by gastroenterology.   Past Medical History  Diagnosis Date  . Diabetes mellitus   . Hypertension   . Hepatitis C     states he was diagnosed in 2007 or 2007 while living in [place], [place]  . CVA (cerebral infarction) 01/2011  . Hyperlipidemia   . Asthma   . PVD (peripheral vascular disease)   . Anemia   . Stroke   . [id number])     Past Surgical History  Procedure Laterality Date  . Liver biopsy  2005    Done in [place], [place]. Chronic hepatitis with mild periportal inflammation, lobular unicellular necrosis and portal fibrosis. Grade 2, stage 1-2.  Marland Kitchen Colonoscopy with propofol N/A 09/03/2012    [id number] polyp-removed as outlined above. Prominent internal hemorrhoids. Tubular adenoma  . Esophagogastroduodenoscopy (egd) with propofol N/A 09/03/2012   [id number] hernia. Gastric diverticulum. Gastric ulcers with associated erosions. Duodenal erosions. Status post gastric biopsy. H.PYLORI gastritis   . Esophageal biopsy N/A 09/03/2012    Procedure: BIOPSY;  Surgeon: Daneil Dolin, MD;  Location: [facility];  Service: Endoscopy;  Laterality: N/A;  gastric and gastric mucosa  . Polypectomy N/A 09/03/2012    Procedure: POLYPECTOMY;  Surgeon: Daneil Dolin, MD;  Location: [facility];  Service: Endoscopy;  Laterality: N/A;  cecal polyp  . Esophagogastroduodenoscopy (egd) with propofol N/A 12/03/2012    Procedure: ESOPHAGOGASTRODUODENOSCOPY (EGD) WITH PROPOFOL;  Surgeon: Daneil Dolin, MD;  Location: [facility];  Service: Endoscopy;  Laterality: N/A;  . Esophageal biopsy N/A 12/03/2012    Procedure: BIOPSY;  Surgeon: Daneil Dolin, MD;  Location: [facility];  Service: Endoscopy;  Laterality: N/A;    No Known Allergies  History  Substance Use Topics  . Smoking status: Current Some Day Smoker -- 0.50 packs/day for 20 years    Types: Cigarettes  . Smokeless tobacco: Never Used     Comment: given 1-800-Quit Now  . Alcohol Use: 0.0 oz/week     Comment: occ    Family History  Problem Relation Age of Onset  . Breast cancer Mother     deceased  . Cancer Mother   . Diabetes Father   . Hypertension Father   . Heart disease Father     deceased  . Hyperlipidemia Father   . Diabetes Sister   . Hypertension Sister   .  Breast cancer Sister   . Colon cancer Neg Hx   . Liver disease Neg Hx    Prior to Admission medications   Medication Sig Start Date End Date Taking? Authorizing Provider  aspirin 81 MG tablet Take 81 mg by mouth daily.   Yes Historical Provider, MD  esomeprazole (NEXIUM) 40 MG capsule TAKE ONE CAPSULE BY MOUTH DAILY BEFORE BREAKFAST. 08/30/13  Yes Mahala Menghini, PA-C  gabapentin (NEURONTIN) 300 MG capsule Take 300 mg by mouth 2 (two) times daily.   Yes Historical Provider, MD  glipiZIDE (GLUCOTROL) 2.5 mg TABS Take 5 mg by mouth 2 (two) times  daily before a meal.    Yes Nimish C Gosrani, MD  Linaclotide (LINZESS) 290 MCG CAPS capsule Take 1 capsule (290 mcg total) by mouth daily. 30 minutes before breakfast. 11/19/12  Yes Orvil Feil, NP  lisinopril-hydrochlorothiazide (PRINZIDE,ZESTORETIC) 20-12.5 MG per tablet Take 1 tablet by mouth 2 (two) times daily.   Yes Historical Provider, MD  metFORMIN (GLUCOPHAGE) 500 MG tablet Take 500 mg by mouth 2 (two) times daily with a meal.   Yes Nimish C Gosrani, MD  simvastatin (ZOCOR) 20 MG tablet Take 20 mg by mouth daily at 6 PM.   Yes Nimish C Gosrani, MD  amLODipine (NORVASC) 5 MG tablet Take 1 tablet (5 mg total) by mouth daily. 11/03/13   Herminio Commons, MD     Review of Systems  Positive ROS: neg  All other systems have been reviewed and were otherwise negative with the exception of those mentioned in the HPI and as above.  Objective: Vital signs in last 24 hours: Temp:  [97.7 F (36.5 C)-98.6 F (37 C)] 98.3 F (36.8 C) (10/14 1432) Pulse Rate:  [58-88] 76 (10/14 1432) Resp:  [15-20] 18 (10/14 1432) BP: (103-154)/(71-90) 134/86 mmHg (10/14 1432) SpO2:  [96 %-100 %] 100 % (10/14 1432)  General Appearance: Alert, cooperative, no distress, appears stated age Head: Normocephalic, without obvious abnormality, atraumatic Eyes: PERRL, conjunctiva/corneas clear, EOM's intact    Neck: Supple, symmetrical, trachea midline Back: Symmetric, no curvature, ROM normal, no CVA tenderness Lungs:  respirations unlabored Heart: Regular rate and rhythm Abdomen: Soft Extremities: Extremities normal, atraumatic, no cyanosis or edema Pulses: 2+ and symmetric all extremities Skin: Skin color, texture, turgor normal, small scab on medial right shin  NEUROLOGIC:   Mental status: alert and oriented, no aphasia, good attention span, Fund of knowledge/ memory ok Motor Exam - grossly normal except for right lower extremity which has significant weakness of the knee extensors(1/5) and some  weakness in dorsiflexion and plantar flexion Sensory Exam - grossly normal Reflexes: 1+ Coordination - grossly normal except for right lower extremity Gait - not tested Balance - not tested Cranial Nerves: I: smell Not tested  II: visual acuity  OS: na    OD: na  II: visual fields Full to confrontation  II: pupils Equal, round, reactive to light  III,VII: ptosis None  III,IV,VI: extraocular muscles  Full ROM  V: mastication Normal  V: facial light touch sensation  Normal  V,VII: corneal reflex  Present  VII: facial muscle function - upper  Normal  VII: facial muscle function - lower Normal  VIII: hearing Not tested  IX: soft palate elevation  Normal  IX,X: gag reflex Present  XI: trapezius strength  5/5  XI: sternocleidomastoid strength 5/5  XI: neck flexion strength  5/5  XII: tongue strength  Normal    Data Review Lab Results  Component Value  Date   WBC 7.7 11/24/2013   HGB 9.8* 11/24/2013   HCT 31.7* 11/24/2013   MCV 78.5 11/24/2013   PLT 338 11/24/2013   Lab Results  Component Value Date   NA 135* 11/23/2013   K 4.1 11/23/2013   CL 98 11/23/2013   CO2 26 11/23/2013   BUN 12 11/23/2013   CREATININE 0.97 11/23/2013   GLUCOSE 222* 11/23/2013   Lab Results  Component Value Date   INR 1.09 11/23/2013    Assessment/Plan: 63 year old gentleman with an acute disc herniation at L3-4 on the right and a chronic spondylolisthesis with acute weakness in the right leg. He was transferred here for urgent decompressive surgery. He needs a posterior lumbar interbody fusion in order to address the stenosis and segmental instability. Unfortunately he would not likely regain functional use of the right lower extremity for quite some time. He has quite profound weakness in the knee extensors and I doubt that that will respond quickly to decompressive surgery. He was on the schedule for surgery today but unfortunately when he arrived to the hospital he was given food and drink by  the nursing staff. I have spoken with anesthesia and they will make [place] wait at least 6 hours, and therefore I think we will put him on the schedule for tomorrow as I think that is safer than trying to do his surgery at midnight tonight, and I don't think it will make a difference in regards to his neurologic recovery in my opinion. I have spoken with him at length regarding the surgery. He understands the risk of the surgery include but are not limited to bleeding, infection, CSF leak, nerve root injury, numbness, weakness, pseudoarthrosis, hardware failure, need for further surgery, adjacent level disease, lack relief of symptoms, worsening symptoms, loss of bowel bladder or sexual function, need for blood transfusion, and anesthesia risk including DVT pneumonia in my and death. He agrees to proceed.   Sheril Hammond S 11/24/2013 4:43 PM

## 2013-11-24 NOTE — Progress Notes (Signed)
PT Cancellation Note  Patient Details Name: Cory Weber MRN: [id number] DOB: 18-Jan-1951   Cancelled Treatment:    Reason Eval/Treat Not Completed: Other (comment)  Received order and chart reviewed.  Noted per MD noted, "Patient with known history of CVA, diabetes mellitus, hypertension, no residual deficits, presents with significant right lower extremity weakness, MRI of lumbar spine showing significant mass effect on the right L4 nerve root, no acute or subacute CVA on MRI brain, patient is being transferred to [facility] cone 4 ports under Dr. Ronnald Ramp care".  Screen performed, with MMT of Rt hip flexor 4/5, quad 3-/5, HS 4-/5, and ankle DF 3-/5 and Lt LE 4+/5 generalized strength.  Pt reports complaints of pain 9/10 with WB secondary to non-displaced fracture in Rt foot, though pt does report pain on the medial and lateral aspects of the foot.  No recommendations for PT services at this time as pt is transferring to [facility]; will continue d/c recommendations at that location.  PT services to transfer to [facility] as appropriate.     Quanita Barona 11/24/2013, 10:32 AM

## 2013-11-24 NOTE — Clinical Social Work Note (Addendum)
CSW received referral for transportation needs. CSW met with pt at bedside. He states he uses [facility] for appointments, but needs transportation for other things. Pt states he plans to call Aging, Disability, and Transit Services to inquire about a CAP aid as he has Medicaid. [employer] guide provided. No other needs reported. CSW will sign off, but can be reconsulted if needed.  Benay Pike, [place]

## 2013-11-25 ENCOUNTER — Inpatient Hospital Stay (HOSPITAL_COMMUNITY): Payer: Medicaid Other

## 2013-11-25 ENCOUNTER — Inpatient Hospital Stay (HOSPITAL_COMMUNITY): Payer: Medicaid Other | Admitting: Anesthesiology

## 2013-11-25 ENCOUNTER — Encounter (HOSPITAL_COMMUNITY): Payer: Medicaid Other | Admitting: Anesthesiology

## 2013-11-25 ENCOUNTER — Encounter (HOSPITAL_COMMUNITY)
Admission: EM | Disposition: A | Discharge status: Home Health | Payer: Medicaid Other | Source: Home / Self Care | Attending: Neurological Surgery

## 2013-11-25 DIAGNOSIS — M5441 Lumbago with sciatica, right side: Secondary | ICD-10-CM

## 2013-11-25 DIAGNOSIS — Z981 Arthrodesis status: Secondary | ICD-10-CM

## 2013-11-25 DIAGNOSIS — D649 Anemia, unspecified: Secondary | ICD-10-CM

## 2013-11-25 HISTORY — PX: MAXIMUM ACCESS (MAS)POSTERIOR LUMBAR INTERBODY FUSION (PLIF) 1 LEVEL: SHX6368

## 2013-11-25 LAB — GLUCOSE, CAPILLARY
GLUCOSE-CAPILLARY: 219 mg/dL — AB (ref 70–99)
Glucose-Capillary: 270 mg/dL — ABNORMAL HIGH (ref 70–99)
Glucose-Capillary: 137 mg/dL — ABNORMAL HIGH (ref 70–99)

## 2013-11-25 LAB — BASIC METABOLIC PANEL
Anion gap: 12 (ref 5–15)
BUN: 17 mg/dL (ref 6–23)
CALCIUM: 9.2 mg/dL (ref 8.4–10.5)
CO2: 24 meq/L (ref 19–32)
CREATININE: 0.89 mg/dL (ref 0.50–1.35)
Chloride: 97 mEq/L (ref 96–112)
GFR calc Af Amer: >90 mL/min (ref >90)
Glucose, Bld: 286 mg/dL — ABNORMAL HIGH (ref 70–99)
Potassium: 4.3 mEq/L (ref 3.7–5.3)
Sodium: 133 mEq/L — ABNORMAL LOW (ref 137–147)

## 2013-11-25 LAB — CBC
HCT: 30.8 % — ABNORMAL LOW (ref 39.0–52.0)
HEMOGLOBIN: 9.7 g/dL — AB (ref 13.0–17.0)
MCH: 23.8 pg — AB (ref 26.0–34.0)
MCHC: 31.5 g/dL (ref 30.0–36.0)
MCV: 75.7 fL — AB (ref 78.0–100.0)
Platelets: 318 10*3/uL (ref 150–400)
RBC: 4.07 MIL/uL — AB (ref 4.22–5.81)
RDW: 16.8 % — ABNORMAL HIGH (ref 11.5–15.5)
WBC: 10.2 10*3/uL (ref 4.0–10.5)

## 2013-11-25 LAB — SURGICAL PCR SCREEN
MRSA, PCR: NEGATIVE
STAPHYLOCOCCUS AUREUS: POSITIVE — AB

## 2013-11-25 SURGERY — FOR MAXIMUM ACCESS (MAS) POSTERIOR LUMBAR INTERBODY FUSION (PLIF) 1 LEVEL
Anesthesia: General | Site: Back

## 2013-11-25 MED ORDER — VANCOMYCIN HCL IN DEXTROSE 1-5 GM/200ML-% IV SOLN
1000.0000 mg | INTRAVENOUS | Status: DC
  Filled 2013-11-25: qty 200

## 2013-11-25 MED ORDER — EPHEDRINE SULFATE 50 MG/ML IJ SOLN
INTRAMUSCULAR | Status: DC | PRN
  Administered 2013-11-25: 10 mg via INTRAVENOUS

## 2013-11-25 MED ORDER — METHOCARBAMOL 1000 MG/10ML IJ SOLN
500.0000 mg | Freq: Four times a day (QID) | INTRAVENOUS | Status: DC | PRN
  Filled 2013-11-25: qty 5

## 2013-11-25 MED ORDER — VANCOMYCIN HCL IN DEXTROSE 1-5 GM/200ML-% IV SOLN
1000.0000 mg | Freq: Once | INTRAVENOUS | Status: AC
  Administered 2013-11-26: 1000 mg via INTRAVENOUS
  Filled 2013-11-25: qty 200

## 2013-11-25 MED ORDER — SODIUM CHLORIDE 0.9 % IV SOLN: 250.0000 mL | INTRAVENOUS | Status: DC

## 2013-11-25 MED ORDER — PROPOFOL INFUSION 10 MG/ML OPTIME
INTRAVENOUS | Status: DC | PRN
  Administered 2013-11-25: 50 ug/kg/min via INTRAVENOUS

## 2013-11-25 MED ORDER — PHENOL 1.4 % MT LIQD: 1.0000 | OROMUCOSAL | Status: DC | PRN

## 2013-11-25 MED ORDER — MENTHOL 3 MG MT LOZG: 1.0000 | LOZENGE | OROMUCOSAL | Status: DC | PRN

## 2013-11-25 MED ORDER — ACETAMINOPHEN 650 MG RE SUPP: 650.0000 mg | RECTAL | Status: DC | PRN

## 2013-11-25 MED ORDER — SODIUM CHLORIDE 0.9 % IR SOLN
Status: DC | PRN
  Administered 2013-11-25: 18:00:00

## 2013-11-25 MED ORDER — ONDANSETRON HCL 4 MG/2ML IJ SOLN
INTRAMUSCULAR | Status: AC
Start: 2013-11-25 — End: 2013-11-25
  Filled 2013-11-25: qty 2

## 2013-11-25 MED ORDER — BUPIVACAINE HCL (PF) 0.25 % IJ SOLN
INTRAMUSCULAR | Status: DC | PRN
  Administered 2013-11-25: 3 mL

## 2013-11-25 MED ORDER — MIDAZOLAM HCL 2 MG/2ML IJ SOLN
INTRAMUSCULAR | Status: AC
Start: 2013-11-25 — End: 2013-11-25
  Filled 2013-11-25: qty 2

## 2013-11-25 MED ORDER — PROPOFOL 10 MG/ML IV BOLUS
INTRAVENOUS | Status: AC
  Filled 2013-11-25: qty 20

## 2013-11-25 MED ORDER — THROMBIN 20000 UNITS EX SOLR
CUTANEOUS | Status: DC | PRN
  Administered 2013-11-25: 18:00:00 via TOPICAL

## 2013-11-25 MED ORDER — ACETAMINOPHEN 325 MG PO TABS
650.0000 mg | ORAL_TABLET | ORAL | Status: DC | PRN
  Administered 2013-11-27 – 2013-11-28 (×2): 650 mg via ORAL
  Filled 2013-11-25 (×4): qty 2

## 2013-11-25 MED ORDER — MIDAZOLAM HCL 5 MG/5ML IJ SOLN
INTRAMUSCULAR | Status: DC | PRN
  Administered 2013-11-25: 2 mg via INTRAVENOUS

## 2013-11-25 MED ORDER — METHOCARBAMOL 500 MG PO TABS
500.0000 mg | ORAL_TABLET | Freq: Four times a day (QID) | ORAL | Status: DC | PRN
  Administered 2013-11-27: 500 mg via ORAL
  Filled 2013-11-25 (×2): qty 1

## 2013-11-25 MED ORDER — POTASSIUM CHLORIDE IN NACL 20-0.9 MEQ/L-% IV SOLN
INTRAVENOUS | Status: DC
  Administered 2013-11-25 – 2013-11-27 (×3): via INTRAVENOUS
  Filled 2013-11-25 (×2): qty 1000

## 2013-11-25 MED ORDER — LACTATED RINGERS IV SOLN
INTRAVENOUS | Status: DC | PRN
  Administered 2013-11-25 (×2): via INTRAVENOUS

## 2013-11-25 MED ORDER — HYDROMORPHONE HCL 1 MG/ML IJ SOLN: 0.2500 mg | INTRAMUSCULAR | Status: DC | PRN

## 2013-11-25 MED ORDER — ONDANSETRON HCL 4 MG/2ML IJ SOLN
INTRAMUSCULAR | Status: DC | PRN
  Administered 2013-11-25: 4 mg via INTRAVENOUS

## 2013-11-25 MED ORDER — SODIUM CHLORIDE 0.9 % IV SOLN
1000.0000 mg | INTRAVENOUS | Status: DC | PRN
  Administered 2013-11-25: 1000 mg via INTRAVENOUS

## 2013-11-25 MED ORDER — FENTANYL CITRATE 0.05 MG/ML IJ SOLN
INTRAMUSCULAR | Status: DC | PRN
  Administered 2013-11-25: 100 ug via INTRAVENOUS
  Administered 2013-11-25 (×2): 50 ug via INTRAVENOUS
  Administered 2013-11-25: 100 ug via INTRAVENOUS
  Administered 2013-11-25: 50 ug via INTRAVENOUS

## 2013-11-25 MED ORDER — LIDOCAINE HCL (CARDIAC) 20 MG/ML IV SOLN
INTRAVENOUS | Status: AC
  Filled 2013-11-25: qty 5

## 2013-11-25 MED ORDER — FENTANYL CITRATE 0.05 MG/ML IJ SOLN
INTRAMUSCULAR | Status: AC
  Filled 2013-11-25: qty 5

## 2013-11-25 MED ORDER — NEOSTIGMINE METHYLSULFATE 10 MG/10ML IV SOLN
INTRAVENOUS | Status: AC
  Filled 2013-11-25: qty 1

## 2013-11-25 MED ORDER — THROMBIN 5000 UNITS EX SOLR
OROMUCOSAL | Status: DC | PRN
  Administered 2013-11-25: 18:00:00 via TOPICAL

## 2013-11-25 MED ORDER — SODIUM CHLORIDE 0.9 % IJ SOLN
3.0000 mL | Freq: Two times a day (BID) | INTRAMUSCULAR | Status: DC
  Administered 2013-11-26 – 2013-11-29 (×3): 3 mL via INTRAVENOUS

## 2013-11-25 MED ORDER — 0.9 % SODIUM CHLORIDE (POUR BTL) OPTIME
TOPICAL | Status: DC | PRN
  Administered 2013-11-25: 1000 mL

## 2013-11-25 MED ORDER — ONDANSETRON HCL 4 MG/2ML IJ SOLN
INTRAMUSCULAR | Status: AC
  Filled 2013-11-25: qty 2

## 2013-11-25 MED ORDER — PROPOFOL 10 MG/ML IV BOLUS
INTRAVENOUS | Status: DC | PRN
  Administered 2013-11-25: 150 mg via INTRAVENOUS
  Administered 2013-11-25: 30 mg via INTRAVENOUS
  Administered 2013-11-25: 20 mg via INTRAVENOUS

## 2013-11-25 MED ORDER — SUCCINYLCHOLINE CHLORIDE 20 MG/ML IJ SOLN
INTRAMUSCULAR | Status: DC | PRN
  Administered 2013-11-25: 100 mg via INTRAVENOUS

## 2013-11-25 MED ORDER — SODIUM CHLORIDE 0.9 % IJ SOLN: 3.0000 mL | INTRAMUSCULAR | Status: DC | PRN

## 2013-11-25 MED ORDER — ROCURONIUM BROMIDE 50 MG/5ML IV SOLN
INTRAVENOUS | Status: AC
  Filled 2013-11-25: qty 1

## 2013-11-25 MED ORDER — ONDANSETRON HCL 4 MG/2ML IJ SOLN: 4.0000 mg | INTRAMUSCULAR | Status: DC | PRN

## 2013-11-25 MED ORDER — LIDOCAINE HCL (CARDIAC) 20 MG/ML IV SOLN
INTRAVENOUS | Status: DC | PRN
  Administered 2013-11-25: 100 mg via INTRAVENOUS

## 2013-11-25 SURGICAL SUPPLY — 68 items
APL SKNCLS STERI-STRIP NONHPOA (GAUZE/BANDAGES/DRESSINGS) ×1
BAG DECANTER FOR FLEXI CONT (MISCELLANEOUS) ×3 IMPLANT
BENZOIN TINCTURE PRP APPL 2/3 (GAUZE/BANDAGES/DRESSINGS) ×3 IMPLANT
BLADE CLIPPER SURG (BLADE) IMPLANT
BONE MATRIX OSTEOCEL PRO SM (Bone Implant) ×6 IMPLANT
BUR MATCHSTICK NEURO 3.0 LAGG (BURR) ×3 IMPLANT
CAGE COROENT LRG 10X9X28-12 (Cage) ×6 IMPLANT
CANISTER SUCT 3000ML (MISCELLANEOUS) ×3 IMPLANT
CLIP NEUROVISION LG (CLIP) ×3 IMPLANT
CLOSURE WOUND 1/2 X4 (GAUZE/BANDAGES/DRESSINGS) ×2
CONT SPEC 4OZ CLIKSEAL STRL BL (MISCELLANEOUS) ×6 IMPLANT
COVER BACK TABLE 24X17X13 BIG (DRAPES) IMPLANT
COVER TABLE BACK 60X90 (DRAPES) ×3 IMPLANT
DRAPE C-ARM 42X72 X-RAY (DRAPES) ×3 IMPLANT
DRAPE C-ARMOR (DRAPES) ×3 IMPLANT
DRAPE LAPAROTOMY 100X72X124 (DRAPES) ×3 IMPLANT
DRAPE POUCH INSTRU U-SHP 10X18 (DRAPES) ×3 IMPLANT
DRAPE SURG 17X23 STRL (DRAPES) ×3 IMPLANT
DRSG OPSITE 4X5.5 SM (GAUZE/BANDAGES/DRESSINGS) ×6 IMPLANT
DRSG OPSITE POSTOP 4X6 (GAUZE/BANDAGES/DRESSINGS) ×3 IMPLANT
DRSG TELFA 3X8 NADH (GAUZE/BANDAGES/DRESSINGS) ×3 IMPLANT
DURAPREP 26ML APPLICATOR (WOUND CARE) ×3 IMPLANT
ELECT REM PT RETURN 9FT ADLT (ELECTROSURGICAL) ×3
ELECTRODE REM PT RTRN 9FT ADLT (ELECTROSURGICAL) ×1 IMPLANT
EVACUATOR 1/8 PVC DRAIN (DRAIN) ×3 IMPLANT
GAUZE SPONGE 4X4 16PLY XRAY LF (GAUZE/BANDAGES/DRESSINGS) IMPLANT
GLOVE BIO SURGEON STRL SZ8 (GLOVE) ×6 IMPLANT
GLOVE BIOGEL PI IND STRL 7.0 (GLOVE) ×1 IMPLANT
GLOVE BIOGEL PI IND STRL 8 (GLOVE) ×1 IMPLANT
GLOVE BIOGEL PI INDICATOR 7.0 (GLOVE) ×2
GLOVE BIOGEL PI INDICATOR 8 (GLOVE) ×2
GLOVE OPTIFIT SS 6.5 STRL BRWN (GLOVE) ×9 IMPLANT
GOWN STRL REUS W/ TWL LRG LVL3 (GOWN DISPOSABLE) IMPLANT
GOWN STRL REUS W/ TWL XL LVL3 (GOWN DISPOSABLE) ×2 IMPLANT
GOWN STRL REUS W/TWL 2XL LVL3 (GOWN DISPOSABLE) IMPLANT
GOWN STRL REUS W/TWL LRG LVL3 (GOWN DISPOSABLE)
GOWN STRL REUS W/TWL XL LVL3 (GOWN DISPOSABLE) ×4
HEMOSTAT POWDER KIT SURGIFOAM (HEMOSTASIS) IMPLANT
KIT BASIN OR (CUSTOM PROCEDURE TRAY) ×3 IMPLANT
KIT NEEDLE NVM5 EMG ELECT (KITS) ×1 IMPLANT
KIT NEEDLE NVM5 EMG ELECTRODE (KITS) ×2
KIT ROOM TURNOVER OR (KITS) ×3 IMPLANT
MILL MEDIUM DISP (BLADE) IMPLANT
NEEDLE HYPO 25X1 1.5 SAFETY (NEEDLE) ×3 IMPLANT
NS IRRIG 1000ML POUR BTL (IV SOLUTION) ×3 IMPLANT
PACK LAMINECTOMY NEURO (CUSTOM PROCEDURE TRAY) ×3 IMPLANT
PAD ARMBOARD 7.5X6 YLW CONV (MISCELLANEOUS) ×9 IMPLANT
PUTTY STIMUBLAST 2.5CC (Putty) ×3 IMPLANT
ROD 5.5X40MM (Rod) ×6 IMPLANT
SCREW LOCK (Screw) ×8 IMPLANT
SCREW LOCK FXNS SPNE MAS PL (Screw) ×4 IMPLANT
SCREW PLIF MAS 5.5X35 LUMBAR (Screw) ×3 IMPLANT
SCREW SHANK 5.0X35 (Screw) ×3 IMPLANT
SCREW SHANK 5.0X40MM (Screw) ×6 IMPLANT
SCREW TULIP 5.5 (Screw) ×6 IMPLANT
SPONGE LAP 4X18 X RAY DECT (DISPOSABLE) IMPLANT
SPONGE SURGIFOAM ABS GEL 100 (HEMOSTASIS) ×3 IMPLANT
STRIP CLOSURE SKIN 1/2X4 (GAUZE/BANDAGES/DRESSINGS) ×4 IMPLANT
SUT VIC AB 0 CT1 18XCR BRD8 (SUTURE) ×1 IMPLANT
SUT VIC AB 0 CT1 8-18 (SUTURE) ×2
SUT VIC AB 2-0 CP2 18 (SUTURE) ×3 IMPLANT
SUT VIC AB 3-0 SH 8-18 (SUTURE) ×6 IMPLANT
SYR 20ML ECCENTRIC (SYRINGE) ×3 IMPLANT
SYR 3ML LL SCALE MARK (SYRINGE) IMPLANT
TOWEL OR 17X24 6PK STRL BLUE (TOWEL DISPOSABLE) ×3 IMPLANT
TOWEL OR 17X26 10 PK STRL BLUE (TOWEL DISPOSABLE) ×3 IMPLANT
TRAY FOLEY CATH 14FRSI W/METER (CATHETERS) ×3 IMPLANT
WATER STERILE IRR 1000ML POUR (IV SOLUTION) ×3 IMPLANT

## 2013-11-25 NOTE — Progress Notes (Signed)
ANTIBIOTIC CONSULT NOTE - INITIAL  Pharmacy Consult for vancomycin Indication: surgical prophylaxis  No Known Allergies  Patient Measurements: Height: 6\' 1"  (185.4 cm) Weight: 201 lb (91.173 kg) IBW/kg (Calculated) : 79.9   Vital Signs: Temp: 97.9 F (36.6 C) (10/15 2025) Temp Source: Oral (10/15 1517) BP: 130/82 mmHg (10/15 2012) Pulse Rate: 78 (10/15 2025) Intake/Output from previous day: 10/14 0701 - 10/15 0700 In: 300 [P.O.:300] Out: 2225 [Urine:2225] Intake/Output from this shift: Total I/O In: 100 [I.V.:100] Out: 100 [Blood:100]  Labs:  Recent Labs  11/23/13 1211 11/24/13 0335 11/25/13 0549  WBC 7.7 7.7 10.2  HGB 7.7* 9.8* 9.7*  PLT 336 338 318  CREATININE 0.97  --  0.89   Estimated Creatinine Clearance: 98.5 ml/min (by C-G formula based on Cr of 0.89). No results found for this basename: VANCOTROUGH, Corlis Leak, VANCORANDOM, GENTTROUGH, GENTPEAK, GENTRANDOM, TOBRATROUGH, TOBRAPEAK, TOBRARND, AMIKACINPEAK, AMIKACINTROU, AMIKACIN,  in the last 72 hours   Microbiology: Recent Results (from the past 720 hour(s))  SURGICAL PCR SCREEN     Status: Abnormal   Collection Time    11/25/13  3:58 AM      Result Value Ref Range Status   MRSA, PCR NEGATIVE  NEGATIVE Final   Staphylococcus aureus POSITIVE (*) NEGATIVE Final   Comment:            The Xpert SA Assay (FDA     approved for NASAL specimens     in patients over 77 years of age),     is one component of     a comprehensive surveillance     program.  Test performance has     been validated by [employer] for patients greater     than or equal to 74 year old.     It is not intended     to diagnose infection nor to     guide or monitor treatment.    Medical History: Past Medical History  Diagnosis Date  . Diabetes mellitus   . Hypertension   . Hepatitis C     states he was diagnosed in 2007 or 2007 while living in [place], [place]  . CVA (cerebral infarction) 01/2011  . Hyperlipidemia   .  Asthma   . PVD (peripheral vascular disease)   . Anemia   . Stroke   . Headache(784.0)     Assessment: 56 YOM s/p decompressive lumbar laminectomy withOUT drain placement. Received pre-op dose of vancomycin 1000mg  at 1738 this afternoon. SCr 0.89 with est CrCl ~49mL/min.  Goal of Therapy:  adequate prophylaxis  Plan:  1. Vancomycin 1000mg  IV x1 to be given 12 hours after pre-op dose 2. Pharmacy to sign off as no further doses ordered per protocol. Please re-consult if needed  Samaya Boardley D. Harmonee Tozer, PharmD, BCPS Clinical Pharmacist Pager: [phone] 11/25/2013 8:50 PM

## 2013-11-25 NOTE — Anesthesia Preprocedure Evaluation (Addendum)
Anesthesia Evaluation  Patient identified by MRN, date of birth, ID band Patient awake    Reviewed: Allergy & Precautions, H&P , NPO status , Patient's Chart, lab work & pertinent test results  Airway Mallampati: II TM Distance: >3 FB Neck ROM: Full    Dental  (+) Edentulous Lower, Edentulous Upper   Pulmonary asthma , Current Smoker,          Cardiovascular hypertension, + Peripheral Vascular Disease Rhythm:Regular Rate:Normal     Neuro/Psych  Headaches,    GI/Hepatic PUD, (+) Hepatitis -  Endo/Other  diabetes  Renal/GU      Musculoskeletal   Abdominal   Peds  Hematology   Anesthesia Other Findings   Reproductive/Obstetrics                         Anesthesia Physical Anesthesia Plan  ASA: III  Anesthesia Plan: General   Post-op Pain Management:    Induction: Intravenous  Airway Management Planned: Oral ETT  Additional Equipment:   Intra-op Plan:   Post-operative Plan: Extubation in OR  Informed Consent: I have reviewed the patients History and Physical, chart, labs and discussed the procedure including the risks, benefits and alternatives for the proposed anesthesia with the patient or authorized representative who has indicated his/her understanding and acceptance.   Dental advisory given  Plan Discussed with: CRNA and Anesthesiologist  Anesthesia Plan Comments:        Anesthesia Quick Evaluation

## 2013-11-25 NOTE — Transfer of Care (Signed)
Immediate Anesthesia Transfer of Care Note  Patient: Cory Weber  Procedure(s) Performed: Procedure(s) with comments: FOR MAXIMUM ACCESS (MAS) POSTERIOR LUMBAR INTERBODY FUSION (PLIF) 1 LEVEL (N/A) - FOR MAXIMUM ACCESS (MAS) POSTERIOR LUMBAR INTERBODY FUSION (PLIF) 1 LEVEL LUMBAR 3-4  Patient Location: PACU  Anesthesia Type:General  Level of Consciousness: awake, alert , oriented and patient cooperative  Airway & Oxygen Therapy: Patient Spontanous Breathing and Patient connected to face mask oxygen  Post-op Assessment: Report given to PACU RN, Post -op Vital signs reviewed and stable and Patient moving all extremities  Post vital signs: Reviewed and stable  Complications: No apparent anesthesia complications

## 2013-11-25 NOTE — Anesthesia Postprocedure Evaluation (Signed)
Anesthesia Post Note  Patient: Cory Weber  Procedure(s) Performed: Procedure(s) (LRB): FOR MAXIMUM ACCESS (MAS) POSTERIOR LUMBAR INTERBODY FUSION (PLIF) 1 LEVEL (N/A)  Anesthesia type: general  Patient location: PACU  Post pain: Pain level controlled  Post assessment: Patient's Cardiovascular Status Stable  Last Vitals:  Filed Vitals:   11/25/13 2025  BP:   Pulse: 78  Temp: 36.6 C  Resp: 16    Post vital signs: Reviewed and stable  Level of consciousness: sedated  Complications: No apparent anesthesia complications

## 2013-11-25 NOTE — Op Note (Signed)
11/23/2013 - 11/25/2013  7:23 PM  PATIENT:  Cory Weber  63 y.o. male  PRE-OPERATIVE DIAGNOSIS: Grade 1 spondylolisthesis L3-4 with large R HNP with R leg pain and weakness  POST-OPERATIVE DIAGNOSIS:  same  PROCEDURE:   1. Decompressive lumbar laminectomy L3-4 requiring more work than would be required for a simple exposure of the disk for PLIF in order to adequately decompress the neural elements and address the spinal stenosis 2. Posterior lumbar interbody fusion L3-4 using PEEK interbody cages packed with morcellized allograft and autograft 3. Posterior fixation L3-4 using Nuvasive cortical pedicle screws.    SURGEON:  Sherley Bounds, MD  ASSISTANTS: Dr. Vertell Limber  ANESTHESIA:  General  EBL: 100 ml     BLOOD ADMINISTERED:none  DRAINS: none  INDICATION FOR PROCEDURE: This patient was admitted with acute weakness in the right leg. He had weakness in the quadricep. He did not have weakness in the hip flexor. He had severe pain in his right leg. MRI showed grade 1 spondylolisthesis at L3-4 with severe spinal stenosis and a large right-sided disc protrusion with an inferior free fragment compressing the right L4 nerve root. I recommended decompressive laminectomy and microdiscectomy followed by instrumented fusion to address his spinal stenosis and segmental instability. Patient understood the risks, benefits, and alternatives and potential outcomes and wished to proceed.  PROCEDURE DETAILS:  The patient was brought to the operating room. After induction of generalized endotracheal anesthesia the patient was rolled into the prone position on chest rolls and all pressure points were padded. The patient's lumbar region was cleaned and then prepped with DuraPrep and draped in the usual sterile fashion. Anesthesia was injected and then a dorsal midline incision was made and carried down to the lumbosacral fascia. The fascia was opened and the paraspinous musculature was taken down in a  subperiosteal fashion to expose L3-4. A self-retaining retractor was placed. Intraoperative fluoroscopy confirmed my level, and I started with placement of the L3 cortical pedicle screws. The pedicle screw entry zones were identified utilizing surface landmarks and  AP and lateral fluoroscopy. I scored the cortex with the high-speed drill and then used the hand drill and EMG monitoring to drill an upward and outward direction into the pedicle. I then tapped line to line, and the tap was also monitored. I then placed a 5-0 x 40 mm cortical pedicle screw into the pedicles of L3 bilaterally. I then turned my attention to the decompression and the spinous process was removed and complete lumbar laminectomies, hemi- facetectomies, and foraminotomies were performed at L3-4. The patient had significant spinal stenosis and this required more work than would be required for a simple exposure of the disc for posterior lumbar interbody fusion. Much more generous decompression was undertaken in order to adequately decompress the neural elements and address the patient's leg pain. The yellow ligament was removed to expose the underlying dura and nerve roots, and generous foraminotomies were performed to adequately decompress the neural elements. Both the exiting and traversing nerve roots were decompressed on both sides until a coronary dilator passed easily along the nerve roots. There was a large inferior free fragment compressing the right L4 nerve root was removed with a pituitary rongeur and I nerve hook. Once the decompression was complete, I turned my attention to the posterior lower lumbar interbody fusion. The epidural venous vasculature was coagulated and cut sharply. Disc space was incised and the initial discectomy was performed with pituitary rongeurs. The disc space was distracted with sequential distractors to  a height of 10 mm. We then used a series of scrapers and shavers to prepare the endplates for fusion. The  midline was prepared with Epstein curettes. Once the complete discectomy was finished, we packed an appropriate sized peek interbody cage with local autograft and morcellized allograft, gently retracted the nerve root, and tapped the cage into position at L3-4.  The midline between the cages was packed with morselized autograft and allograft. We then turned our attention to the placement of the lower pedicle screws. The pedicle screw entry zones were identified utilizing surface landmarks and fluoroscopy. I drilled into each pedicle utilizing the hand drill and EMG monitoring, and tapped each pedicle with the appropriate tap. We palpated with a ball probe to assure no break in the cortex. We then placed 5-0 x 35 mm cortical pedicle screws into the pedicles bilaterally at L4.  We then placed lordotic rods into the multiaxial screw heads of the pedicle screws and locked these in position with the locking caps and anti-torque device. We then checked our construct with AP and lateral fluoroscopy. Irrigated with copious amounts of bacitracin-containing saline solution. Placed a medium Hemovac drain through separate stab incision. Inspected the nerve roots once again to assure adequate decompression, lined to the dura with Gelfoam, and closed the muscle and the fascia with 0 Vicryl. Closed the subcutaneous tissues with 2-0 Vicryl and subcuticular tissues with 3-0 Vicryl. The skin was closed with benzoin and Steri-Strips. Dressing was then applied, the patient was awakened from general anesthesia and transported to the recovery room in stable condition. At the end of the procedure all sponge, needle and instrument counts were correct.   PLAN OF CARE: Admit to inpatient   PATIENT DISPOSITION:  PACU - hemodynamically stable.   Delay start of Pharmacological VTE agent (>24hrs) due to surgical blood loss or risk of bleeding:  yes

## 2013-11-25 NOTE — Progress Notes (Signed)
OT Cancellation Note  Patient Details Name: Cory Weber MRN: [id number] DOB: 1950-07-19   Cancelled Treatment:    Reason Eval/Treat Not Completed: Patient not medically ready (order cancelled pending surgery)  Peri Maris Pager: [phone]  11/25/2013, 7:46 AM

## 2013-11-25 NOTE — Progress Notes (Signed)
Patient Demographics  Cory Weber, is a 63 y.o. male, DOB - February 29, 1950, [id number]  Admit date - 11/23/2013   Admitting Physician Kathie Dike, MD  Outpatient Primary MD for the patient is Maggie Font, MD  LOS - 2   Chief Complaint  Patient presents with  . extremity weakness       Brief narrative: Patient with known history of CVA, diabetes mellitus, hypertension, no residual deficits, presents with significant right lower extremity weakness, MRI of lumbar spine showing significant mass effect on the right L4 nerve root, no acute or subacute CVA on MRI brain, patient is being transferred to [facility] cone 4 ports under Dr. Ronnald Ramp care. Medicine will continue to follow as a consult.  Subjective:   Cory Weber today has, No headache, No chest pain, No abdominal pain - No Nausea, SOB. Patient reports his right lower extremity weakness persist, denies any urinary or stool incontinence.  Assessment & Plan    Active Problems:   DM (diabetes mellitus), type 2, uncontrolled   Accelerated hypertension   Tobacco abuse   Normocytic anemia   Right leg weakness   Lumbar spinal stenosis   right lower extremity weakness -Due to severe lumbar radiculopathy, with compression mass effect on the L4 right nerve root. MRI showing mass at L4 level with compression of nerve root.  -There is no acute finding of MRI brain to suggest acute or subacute stroke, but patient is having significant atherosclerotic disease in multiple vessels in the MRA of the brain, will resume aspirin when patient is more stable. -Plan for decompressive surgery today  Anemia -Patient is known to have history of iron deficiency anemia, with extensive workup in the past including colonoscopy and endoscopy in 2014, showing gastric ulcer and erosions. -Patient is Hemoccult negative x2 in ED, no  evidence of active bleed, melena, or hematemesis. -Patient was transfused 2 units packed red blood cell 11/23/2013 -Hg remains stable at 9.7  Diabetes mellitus -Seems to be controlled while inpatient, continue with insulin sliding scale.  Hypertension -Continue Amlodipine 5 mg PO q daily and Lisinopril 20 mg PO BID  History of CVA -No acute findings of acute or subacute stroke an MRI of the brain. - Continue with statin, and resume aspirin once a stable and if no surgical intervention is planned.  Fifth metatarsal fracture -Will consult Ortho after decompressive surgery  Code Status: Full  Family Communication:   Disposition Plan: Plan for decompressive surgery today   Procedures  None   Consults   Neurosurgery Dr. Ronnald Ramp    Medications  Scheduled Meds: . amLODipine  5 mg Oral Daily  . gabapentin  300 mg Oral BID  . lisinopril  20 mg Oral BID   And  . hydrochlorothiazide  12.5 mg Oral BID  . insulin aspart  0-15 Units Subcutaneous TID WC  . insulin aspart  0-5 Units Subcutaneous QHS  . insulin glargine  12 Units Subcutaneous QHS  . Linaclotide  290 mcg Oral Daily  . pantoprazole (PROTONIX) IV  40 mg Intravenous Q12H   Continuous Infusions: . 0.9 % NaCl with KCl 20 mEq / L 75 mL/hr at 11/24/13 2024   PRN Meds:.acetaminophen, acetaminophen, menthol-cetylpyridinium, morphine injection, ondansetron (ZOFRAN) IV, oxyCODONE-acetaminophen, phenol, senna-docusate  DVT  Prophylaxis  Heparin - SCDs   Lab Results  Component Value Date   PLT 318 11/25/2013    Antibiotics    Anti-infectives   None          Objective:   Filed Vitals:   11/25/13 0159 11/25/13 0556 11/25/13 1004 11/25/13 1347  BP: 137/73 156/79 142/75 150/92  Pulse: 69 69 57 55  Temp: 98 F (36.7 C) 97.6 F (36.4 C) 98.2 F (36.8 C) 98 F (36.7 C)  TempSrc: Oral Oral Oral Oral  Resp: 20 20 20 19   Height:      Weight:      SpO2: 98% 98% 98% 100%    Wt Readings from Last 3 Encounters:    11/23/13 91.173 kg (201 lb)  11/23/13 91.173 kg (201 lb)  11/03/13 96.163 kg (212 lb)     Intake/Output Summary (Last 24 hours) at 11/25/13 1513 Last data filed at 11/25/13 1431  Gross per 24 hour  Intake    300 ml  Output   2900 ml  Net  -2600 ml     Physical Exam  Awake Alert, Oriented X 3, No new F.N deficits, Normal affect [place].AT,PERRAL Supple Neck,No JVD, No cervical lymphadenopathy appriciated.  Symmetrical Chest wall movement, Good air movement bilaterally, CTAB RRR,No Gallops,Rubs or new Murmurs, No Parasternal Heave +ve B.Sounds, Abd Soft, No tenderness, No organomegaly appriciated, No rebound - guarding or rigidity. No Cyanosis, Clubbing or edema, No new Rash or bruise   Right lower extremity weakness, 2-3 out 5 and proximal part, unable to lift his right foot of the bed, remainder of extremities have 5 out of 5 strength.  Data Review   Micro Results Recent Results (from the past 240 hour(s))  SURGICAL PCR SCREEN     Status: Abnormal   Collection Time    11/25/13  3:58 AM      Result Value Ref Range Status   MRSA, PCR NEGATIVE  NEGATIVE Final   Staphylococcus aureus POSITIVE (*) NEGATIVE Final   Comment:            The Xpert SA Assay (FDA     approved for NASAL specimens     in patients over 62 years of age),     is one component of     a comprehensive surveillance     program.  Test performance has     been validated by [employer] for patients greater     than or equal to 44 year old.     It is not intended     to diagnose infection nor to     guide or monitor treatment.    Radiology Reports Mr Brain Wo Contrast  11/24/2013   CLINICAL DATA:  Previous history of stroke. Right leg weakness and numbness acutely.  EXAM: MRI HEAD WITHOUT CONTRAST  MRA HEAD WITHOUT CONTRAST  TECHNIQUE: Multiplanar, multiecho pulse sequences of the brain and surrounding structures were obtained without intravenous contrast. Angiographic images of the head were obtained  using MRA technique without contrast.  COMPARISON:  Head CT 11/23/2013. MRI 02/06/2011. CT angiography 02/04/2011.  FINDINGS: MRI HEAD FINDINGS  Diffusion imaging does not show any acute or subacute infarction. There are mild chronic small-vessel changes affecting the pons. No focal cerebellar insult. There are old small vessel infarctions within the thalami, left more than right. There are a few old small vessel infarctions within the basal ganglia. There are mild chronic small-vessel ischemic changes within the cerebral hemispheric  deep white matter, an old infarction affecting the posterior body of the corpus callosum. No cortical or large vessel territory infarction. No mass lesion, hemorrhage, hydrocephalus or extra-axial collection. No pituitary mass. No inflammatory sinus disease. No skull or skullbase lesion.  MRA HEAD FINDINGS  Both internal carotid arteries are patent into the brain. There is apparent narrowing of both internal carotid arteries in the proximal siphon regions with stenosis 70% or greater. The supra clinoid internal carotid arteries are both widely patent. The anterior and middle cerebral vessels are patent bilaterally with mild distal vessel atherosclerotic narrowing and irregularity.  The left vertebral artery is the dominant vessel patent to the basilar. The right vertebral artery terminates in PICA. There is atherosclerotic narrowing and irregularity of the distal left vertebral artery in the proximal basilar artery. Focal proximal basilar stenosis is 50% to 70%. Superior cerebellar and posterior cerebral arteries are patent bilaterally. There is mild atherosclerotic irregularity of the more distal PCA branches.  IMPRESSION: No acute or subacute infarction. Old small vessel infarctions affecting the thalami, basal ganglia and hemispheric deep white matter.  Severe atherosclerotic narrowing and irregularity in both carotid siphon regions with stenosis 70% or greater bilaterally  Distal  vessel atherosclerotic narrowing and irregularity in the anterior circulation.  Right vertebral artery terminates in PICA. This could be congenital or due to distal right vertebral occlusion. Narrowing and irregularity of the distal left vertebral artery in the proximal basilar artery. Proximal basilar stenosis estimated at 50-70%.   Electronically Signed   By: Nelson Chimes M.D.   On: 11/24/2013 08:27   Mr Lumbar Spine Wo Contrast  11/24/2013   CLINICAL DATA:  Chronic low back pain. Two day history of right leg weakness. No known injury. No prior surgery.  EXAM: MRI LUMBAR SPINE WITHOUT CONTRAST  TECHNIQUE: Multiplanar, multisequence MR imaging of the lumbar spine was performed. No intravenous contrast was administered.  COMPARISON:  07/31/2011  FINDINGS: Normal overall alignment of the lumbar vertebral bodies. There is stable mild degenerative anterolisthesis of L3 the vertebral bodies demonstrate normal marrow signal. The conus medullaris terminates at L2.  The facets are normally aligned. No pars defects. No significant paraspinal or retroperitoneal findings. The bladder is moderately distended.  L1-2:  No significant findings.  L2-3:  No significant findings.  L3-4: Broad-based bulging degenerated annulus with small central and bilateral foraminal disc protrusions. This in combination with short pedicles and advanced facet disease contributes to moderate to moderately severe spinal and bilateral lateral recess stenosis. There is also right greater than left foraminal stenosis. There is mass effect on the right L3 nerve root. There is a free disc fragment down behind the L4 vertebral body with mass effect on the right side of the thecal sac and on the right L4 nerve root. This is most likely coming from the L3-4 disc space.  L4-5: Bulging degenerated annulus along with a shallow central disc protrusion and facet disease contributes to mild to moderate spinal and bilateral lateral recess stenosis, right  greater than left. There is also a broad-based right foraminal disc protrusion contacting and displacing the right L4 nerve root.  L5-S1: Very shallow right paracentral and medial foraminal disc protrusion but no direct neural compression. No spinal stenosis.  IMPRESSION: 1. Moderate to moderately severe spinal and bilateral lateral recess stenosis at L3-4. There are also significant foraminal disc protrusions contributing to bilateral foraminal stenosis, right slightly greater than left. There is also a free disc fragment down behind the L4 vertebral body with  mass effect on the right side of the thecal sac and likely effecting the right L4 nerve root. 2. Moderate multifactorial spinal and lateral recess stenosis at L4-5. There is also a broad-based right foraminal disc protrusion contacting and displacing the right L4 nerve root.   Electronically Signed   By: Kalman Jewels M.D.   On: 11/24/2013 08:26   Dg Chest Port 1 View  11/23/2013   CLINICAL DATA:  Weakness, fall, anemia  EXAM: PORTABLE CHEST - 1 VIEW  COMPARISON:  02/03/2011  FINDINGS: Lungs are clear.  No pleural effusion or pneumothorax.  The heart is normal in size.  IMPRESSION: No evidence of acute cardiopulmonary disease.   Electronically Signed   By: Julian Hy M.D.   On: 11/23/2013 19:19   Dg Foot Complete Right  11/23/2013   CLINICAL DATA:  Patient fell with twisting injury to right foot 2 days prior. Pain laterally  EXAM: RIGHT FOOT COMPLETE - 3+ VIEW  COMPARISON:  None.  FINDINGS: Frontal, oblique, and lateral views were obtained. There is a nondisplaced fracture of the proximal fifth metatarsal in anatomic alignment. No other fractures. No dislocation. There is mild narrowing of the first MTP joint. No erosive change.  IMPRESSION: Nondisplaced fracture proximal fifth metatarsal, appreciable only on the frontal view. No dislocation. Narrowing first MTP joint.  These results will be called to the ordering clinician or representative  by the Radiologist Assistant, and communication documented in the PACS or zVision Dashboard.   Electronically Signed   By: Lowella Grip M.D.   On: 11/23/2013 17:28   Mr Jodene Nam Head/brain Wo Cm  11/24/2013   CLINICAL DATA:  Previous history of stroke. Right leg weakness and numbness acutely.  EXAM: MRI HEAD WITHOUT CONTRAST  MRA HEAD WITHOUT CONTRAST  TECHNIQUE: Multiplanar, multiecho pulse sequences of the brain and surrounding structures were obtained without intravenous contrast. Angiographic images of the head were obtained using MRA technique without contrast.  COMPARISON:  Head CT 11/23/2013. MRI 02/06/2011. CT angiography 02/04/2011.  FINDINGS: MRI HEAD FINDINGS  Diffusion imaging does not show any acute or subacute infarction. There are mild chronic small-vessel changes affecting the pons. No focal cerebellar insult. There are old small vessel infarctions within the thalami, left more than right. There are a few old small vessel infarctions within the basal ganglia. There are mild chronic small-vessel ischemic changes within the cerebral hemispheric deep white matter, an old infarction affecting the posterior body of the corpus callosum. No cortical or large vessel territory infarction. No mass lesion, hemorrhage, hydrocephalus or extra-axial collection. No pituitary mass. No inflammatory sinus disease. No skull or skullbase lesion.  MRA HEAD FINDINGS  Both internal carotid arteries are patent into the brain. There is apparent narrowing of both internal carotid arteries in the proximal siphon regions with stenosis 70% or greater. The supra clinoid internal carotid arteries are both widely patent. The anterior and middle cerebral vessels are patent bilaterally with mild distal vessel atherosclerotic narrowing and irregularity.  The left vertebral artery is the dominant vessel patent to the basilar. The right vertebral artery terminates in PICA. There is atherosclerotic narrowing and irregularity of the  distal left vertebral artery in the proximal basilar artery. Focal proximal basilar stenosis is 50% to 70%. Superior cerebellar and posterior cerebral arteries are patent bilaterally. There is mild atherosclerotic irregularity of the more distal PCA branches.  IMPRESSION: No acute or subacute infarction. Old small vessel infarctions affecting the thalami, basal ganglia and hemispheric deep white matter.  Severe atherosclerotic  narrowing and irregularity in both carotid siphon regions with stenosis 70% or greater bilaterally  Distal vessel atherosclerotic narrowing and irregularity in the anterior circulation.  Right vertebral artery terminates in PICA. This could be congenital or due to distal right vertebral occlusion. Narrowing and irregularity of the distal left vertebral artery in the proximal basilar artery. Proximal basilar stenosis estimated at 50-70%.   Electronically Signed   By: Nelson Chimes M.D.   On: 11/24/2013 08:27    CBC  Recent Labs Lab 11/23/13 1211 11/24/13 0335 11/25/13 0549  WBC 7.7 7.7 10.2  HGB 7.7* 9.8* 9.7*  HCT 25.8* 31.7* 30.8*  PLT 336 338 318  MCV 76.3* 78.5 75.7*  MCH 22.8* 24.3* 23.8*  MCHC 29.8* 30.9 31.5  RDW 16.8* 17.0* 16.8*  LYMPHSABS 1.6  --   --   MONOABS 0.9  --   --   EOSABS 0.1  --   --   BASOSABS 0.0  --   --     Chemistries   Recent Labs Lab 11/23/13 1211 11/25/13 0549  NA 135* 133*  K 4.1 4.3  CL 98 97  CO2 26 24  GLUCOSE 222* 286*  BUN 12 17  CREATININE 0.97 0.89  CALCIUM 9.2 9.2   ------------------------------------------------------------------------------------------------------------------ estimated creatinine clearance is 98.5 ml/min (by C-G formula based on Cr of 0.89). ------------------------------------------------------------------------------------------------------------------  Recent Labs  11/23/13 2128  HGBA1C 6.5*    ------------------------------------------------------------------------------------------------------------------  Recent Labs  11/24/13 0335  CHOL 127  HDL 44  LDLCALC 62  TRIG 103  CHOLHDL 2.9   ------------------------------------------------------------------------------------------------------------------ No results found for this basename: TSH, T4TOTAL, FREET3, T3FREE, THYROIDAB,  in the last 72 hours ------------------------------------------------------------------------------------------------------------------  Recent Labs  11/24/13 0335  VITAMINB12 493  FOLATE >20.0  FERRITIN 5*  TIBC 567*  IRON 62  RETICCTPCT 1.5    Coagulation profile  Recent Labs Lab 11/23/13 1211  INR 1.09    No results found for this basename: DDIMER,  in the last 72 hours  Cardiac Enzymes  Recent Labs Lab 11/23/13 1211  TROPONINI <0.30   ------------------------------------------------------------------------------------------------------------------ No components found with this basename: POCBNP,      Time Spent in minutes   30 minutes   Kelvin Cellar M.D on 11/25/2013 at 3:13 PM  Between 7am to 7pm - Pager - [phone]  After 7pm go to [email or web address] - password [email or web address]  And look for the night coverage person covering for me after hours  [facility] Office  [phone]   **Disclaimer: This note may have been dictated with voice recognition software. Similar sounding words can inadvertently be transcribed and this note may contain transcription errors which may not have been corrected upon publication of note.**

## 2013-11-26 DIAGNOSIS — M5416 Radiculopathy, lumbar region: Secondary | ICD-10-CM

## 2013-11-26 DIAGNOSIS — F1910 Other psychoactive substance abuse, uncomplicated: Secondary | ICD-10-CM

## 2013-11-26 DIAGNOSIS — R208 Other disturbances of skin sensation: Secondary | ICD-10-CM

## 2013-11-26 DIAGNOSIS — Z981 Arthrodesis status: Secondary | ICD-10-CM

## 2013-11-26 LAB — BASIC METABOLIC PANEL
ANION GAP: 12 (ref 5–15)
BUN: 16 mg/dL (ref 6–23)
CALCIUM: 9.3 mg/dL (ref 8.4–10.5)
CO2: 25 meq/L (ref 19–32)
Chloride: 96 mEq/L (ref 96–112)
Creatinine, Ser: 1.08 mg/dL (ref 0.50–1.35)
GFR calc Af Amer: 84 mL/min — ABNORMAL LOW (ref >90)
GFR calc non Af Amer: 72 mL/min — ABNORMAL LOW (ref >90)
Glucose, Bld: 196 mg/dL — ABNORMAL HIGH (ref 70–99)
POTASSIUM: 4 meq/L (ref 3.7–5.3)
SODIUM: 133 meq/L — AB (ref 137–147)

## 2013-11-26 LAB — GLUCOSE, CAPILLARY
GLUCOSE-CAPILLARY: 202 mg/dL — AB (ref 70–99)
GLUCOSE-CAPILLARY: 192 mg/dL — AB (ref 70–99)
Glucose-Capillary: 222 mg/dL — ABNORMAL HIGH (ref 70–99)
Glucose-Capillary: 158 mg/dL — ABNORMAL HIGH (ref 70–99)
Glucose-Capillary: 246 mg/dL — ABNORMAL HIGH (ref 70–99)

## 2013-11-26 LAB — CBC
HEMATOCRIT: 31.3 % — AB (ref 39.0–52.0)
Hemoglobin: 9.5 g/dL — ABNORMAL LOW (ref 13.0–17.0)
MCH: 24.1 pg — ABNORMAL LOW (ref 26.0–34.0)
MCHC: 30.4 g/dL (ref 30.0–36.0)
MCV: 79.4 fL (ref 78.0–100.0)
Platelets: 311 10*3/uL (ref 150–400)
RBC: 3.94 MIL/uL — ABNORMAL LOW (ref 4.22–5.81)
RDW: 17.3 % — AB (ref 11.5–15.5)
WBC: 12.3 10*3/uL — AB (ref 4.0–10.5)

## 2013-11-26 MED ORDER — PANTOPRAZOLE SODIUM 40 MG PO TBEC
40.0000 mg | DELAYED_RELEASE_TABLET | Freq: Two times a day (BID) | ORAL | Status: DC
  Administered 2013-11-26 – 2013-11-29 (×6): 40 mg via ORAL
  Filled 2013-11-26 (×6): qty 1

## 2013-11-26 MED ORDER — POLYETHYLENE GLYCOL 3350 17 G PO PACK
17.0000 g | PACK | Freq: Every day | ORAL | Status: DC | PRN
  Administered 2013-11-26: 17 g via ORAL
  Filled 2013-11-26 (×2): qty 1

## 2013-11-26 MED FILL — Heparin Sodium (Porcine) Inj 1000 Unit/ML: INTRAMUSCULAR | Qty: 30 | Status: AC

## 2013-11-26 MED FILL — Sodium Chloride IV Soln 0.9%: INTRAVENOUS | Qty: 1000 | Status: AC

## 2013-11-26 NOTE — Evaluation (Signed)
Occupational Therapy Evaluation Patient Details Name: Cory Weber MRN: [id number] DOB: 26-Aug-1950 Today's Date: 11/26/2013    History of Present Illness Patient is a 63 y/o male admitted with three-day history of right leg pain with right leg weakness. He has a history of spondylolisthesis at L3-4 and had a previous disc rupture with a superior free fragment at L3-4 on the right 2 years ago.  Now s/p decompressive lumbar laminectomy L2-3 with PLIF. Pt also with metatarsal fx on RLE from fall in last 3 days.   Clinical Impression   Pt admitted with the above diagnosis and has the deficits listed below. Pt would benefit from cont OT to increase functional mobility during adls and increase I with all LE adls so he can eventually return home safely to living alone.    Follow Up Recommendations  SNF;Supervision/Assistance - 24 hour;Other (comment) (CIR if he had 24/7 S.)    Equipment Recommendations  Tub/shower bench;3 in 1 bedside comode    Recommendations for Other Services       Precautions / Restrictions Precautions Precautions: Fall;Back Precaution Booklet Issued: No Precaution Comments: reviewed all back precautions; no bending/twisting/arching Required Braces or Orthoses: Other Brace/Splint (MD mentioned using R knee brace.) Restrictions Weight Bearing Restrictions: No Other Position/Activity Restrictions: Ortho has not proceeded with consult to give WB precautions      Mobility Bed Mobility Overal bed mobility: Needs Assistance Bed Mobility: Rolling;Sidelying to Sit Rolling: Min guard Sidelying to sit: Min assist;HOB elevated       General bed mobility comments: cues for technique, assisted right leg off bed, cues to use rail  Transfers Overall transfer level: Needs assistance Equipment used: Rolling walker (2 wheeled) Transfers: Sit to/from Stand Sit to Stand: Mod assist         General transfer comment: cues for UE's on bed, and to relay on left leg,  increased time to transition hands to walker    Balance Overall balance assessment: Needs assistance Sitting-balance support: Bilateral upper extremity supported;Feet supported Sitting balance-Leahy Scale: Good     Standing balance support: Bilateral upper extremity supported;During functional activity Standing balance-Leahy Scale: Poor Standing balance comment: Pt must have walker.  Relies on UEs to hold self up with walker.                            ADL Overall ADL's : Needs assistance/impaired Eating/Feeding: Set up;Sitting   Grooming: Set up;Sitting   Upper Body Bathing: Set up;Sitting   Lower Body Bathing: Moderate assistance;Sit to/from stand Lower Body Bathing Details (indicate cue type and reason): assist to reach ankles and below. Pt cannot cross legs up at this time.  Needs to be introduced to AE. Upper Body Dressing : Set up;Sitting   Lower Body Dressing: Maximal assistance;Sit to/from stand Lower Body Dressing Details (indicate cue type and reason): assist to start pants over feet, donn socks and shoes. Assist to stand to pull pants up.  Pt cannot let go of walker at this time to do ADLS in standing. Toilet Transfer: Moderate assistance;Stand-pivot Toilet Transfer Details (indicate cue type and reason): Pt R leg buckles. Pt relies on UEs to transfer with walker. Toileting- Clothing Manipulation and Hygiene: Moderate assistance;Sit to/from stand       Functional mobility during ADLs: Moderate assistance;Rolling walker General ADL Comments: Pt struggles with all adls mobility in standing and all LE adls due to pain, LE weakness and poor balance in standing.  Vision                     Perception     Praxis      Pertinent Vitals/Pain Pain Assessment: 0-10 Pain Score: 8  Pain Location: Back Pain Descriptors / Indicators: Aching Pain Intervention(s): Repositioned;Monitored during session;Patient requesting pain meds-RN notified      Hand Dominance Left   Extremity/Trunk Assessment Upper Extremity Assessment Upper Extremity Assessment: Overall WFL for tasks assessed   Lower Extremity Assessment Lower Extremity Assessment: Defer to PT evaluation RLE Deficits / Details: AAROM WFL, strength hip flexion 2/5, knee extension 2-/5, ankle DF 3-/5 RLE Sensation: decreased light touch;decreased proprioception   Cervical / Trunk Assessment Cervical / Trunk Assessment: Normal   Communication Communication Communication: No difficulties   Cognition Arousal/Alertness: Awake/alert Behavior During Therapy: WFL for tasks assessed/performed Overall Cognitive Status: Within Functional Limits for tasks assessed                     General Comments       Exercises       Shoulder Instructions      Home Living Family/patient expects to be discharged to:: Skilled nursing facility Living Arrangements: Alone Available Help at Discharge: Family;Available PRN/intermittently Type of Home: House Home Access: Stairs to enter [employer] of Steps: 3 Entrance Stairs-Rails: None Home Layout: One level               Home Equipment: Cane - single point          Prior Functioning/Environment Level of Independence: Independent with assistive device(s)        Comments: Pt worked Engineer, production until CVA 3 years ago. Since then he has been overall just walking household distances with can at times and is I with basic adls.    OT Diagnosis: Generalized weakness;Acute pain   OT Problem List: Decreased strength;Decreased range of motion;Impaired balance (sitting and/or standing);Decreased knowledge of use of DME or AE;Pain   OT Treatment/Interventions: Self-care/ADL training;Therapeutic activities;DME and/or AE instruction;Balance training    OT Goals(Current goals can be found in the care plan section) Acute Rehab OT Goals Patient Stated Goal: To be independent at home again. OT Goal  Formulation: With patient Time For Goal Achievement: 12/10/13 Potential to Achieve Goals: Good ADL Goals Pt Will Perform Lower Body Bathing: with supervision;with adaptive equipment;sit to/from stand Pt Will Perform Lower Body Dressing: with supervision;with adaptive equipment;sit to/from stand Pt Will Transfer to Toilet: with supervision;ambulating;bedside commode Pt Will Perform Toileting - Clothing Manipulation and hygiene: with supervision;sit to/from stand Pt Will Perform Tub/Shower Transfer: with supervision;with transfer board;ambulating;rolling walker  OT Frequency: Min 2X/week   Barriers to D/C: Decreased caregiver support  pt needs 24/7 assist at first at d/c and does not have this to my knowledge.       Co-evaluation              End of Session Equipment Utilized During Treatment: Rolling walker Nurse Communication: Mobility status  Activity Tolerance: Patient tolerated treatment well Patient left: in chair;with call bell/phone within reach   Time: [id number] OT Time Calculation (min): 34 min Charges:  OT General Charges $OT Visit: 1 Procedure OT Evaluation $Initial OT Evaluation Tier I: 1 Procedure OT Treatments $Self Care/Home Management : 23-37 mins G-Codes:    Glenford Peers 2013-12-04, 11:45 AM [phone]

## 2013-11-26 NOTE — Progress Notes (Signed)
Patient Demographics  Cory Weber, is a 63 y.o. male, DOB - [birth year], [id number]  Admit date - 11/23/2013   Admitting Physician Kathie Dike, MD  Outpatient Primary MD for the patient is Maggie Font, MD  LOS - 3   Chief Complaint  Patient presents with  . extremity weakness       Brief narrative: Patient with known history of CVA, diabetes mellitus, hypertension, no residual deficits, presents with significant right lower extremity weakness, MRI of lumbar spine showing significant mass effect on the right L4 nerve root, no acute or subacute CVA on MRI brain, patient is being transferred to [facility] cone 4 ports under Dr. Ronnald Ramp care. Medicine will continue to follow as a consult.  Subjective:   Cory Weber today has, No headache, No chest pain, No abdominal pain - No Nausea, SOB. Patient reports his right lower extremity weakness persist, denies any urinary or stool incontinence.  Assessment & Plan    Active Problems:   DM (diabetes mellitus), type 2, uncontrolled   Accelerated hypertension   Tobacco abuse   Normocytic anemia   Right leg weakness   Lumbar spinal stenosis   S/P lumbar spinal fusion   right lower extremity weakness -Due to severe lumbar radiculopathy, with compression mass effect on the L4 right nerve root. MRI showing mass at L4 level with compression of nerve root.  -There is no acute finding of MRI brain to suggest acute or subacute stroke, but patient is having significant atherosclerotic disease in multiple vessels in the MRA of the brain, will resume aspirin when patient is more stable. -Patient undergoing decompressive lumbar laminectomy on 11/25/2013, procedure performed by Dr. Ronnald Ramp of neurosurgery  Anemia -Patient is known to have history of iron deficiency anemia, with extensive workup in the past including colonoscopy  and endoscopy in 2014, showing gastric ulcer and erosions. -Patient is Hemoccult negative x2 in ED, no evidence of active bleed, melena, or hematemesis. -Patient was transfused 2 units packed red blood cell 11/23/2013 -Hg remains stable at 9.5  Diabetes mellitus -Seems to be controlled while inpatient, continue with insulin sliding scale.  Hypertension -Continue Amlodipine 5 mg PO q daily and Lisinopril 20 mg PO BID  History of CVA -No acute findings of acute or subacute stroke an MRI of the brain.  Fifth metatarsal fracture -Patient having a fall several days prior to presentation, imaging showing nondisplaced fracture of proximal fifth metatarsal, in anatomic alignment, no dislocation -Ortho consult  Code Status: Full  Family Communication:   Disposition Plan: Plan for decompressive surgery today   Procedures  None   Consults   Neurosurgery Dr. Ronnald Ramp    Medications  Scheduled Meds: . amLODipine  5 mg Oral Daily  . gabapentin  300 mg Oral BID  . lisinopril  20 mg Oral BID   And  . hydrochlorothiazide  12.5 mg Oral BID  . insulin aspart  0-15 Units Subcutaneous TID WC  . insulin aspart  0-5 Units Subcutaneous QHS  . insulin glargine  12 Units Subcutaneous QHS  . Linaclotide  290 mcg Oral Daily  . pantoprazole  40 mg Oral BID  . sodium chloride  3 mL Intravenous Q12H   Continuous Infusions: . sodium chloride    . 0.9 %  NaCl with KCl 20 mEq / L 75 mL/hr at 11/25/13 2240   PRN Meds:.acetaminophen, acetaminophen, menthol-cetylpyridinium, methocarbamol (ROBAXIN) IV, methocarbamol, morphine injection, ondansetron (ZOFRAN) IV, oxyCODONE-acetaminophen, phenol, polyethylene glycol, senna-docusate, sodium chloride  DVT Prophylaxis  Heparin - SCDs   Lab Results  Component Value Date   PLT 311 11/26/2013    Antibiotics    Anti-infectives   Start     Dose/Rate Route Frequency Ordered Stop   11/26/13 0500  vancomycin (VANCOCIN) IVPB 1000 mg/200 mL premix     1,000  mg 200 mL/hr over 60 Minutes Intravenous  Once 11/25/13 2051 11/26/13 0549   11/25/13 1805  bacitracin 50,000 Units in sodium chloride irrigation 0.9 % 500 mL irrigation  Status:  Discontinued       As needed 11/25/13 1805 11/25/13 1937   11/25/13 1730  vancomycin (VANCOCIN) IVPB 1000 mg/200 mL premix  Status:  Discontinued     1,000 mg 200 mL/hr over 60 Minutes Intravenous To Neuro OR-Station #32 11/25/13 1727 11/25/13 2038          Objective:   Filed Vitals:   11/26/13 0100 11/26/13 0500 11/26/13 1010 11/26/13 1411  BP: 128/84 124/80 126/99 148/91  Pulse: 76 74 73 112  Temp: 97.9 F (36.6 C) 97.8 F (36.6 C) 99.3 F (37.4 C) 99.1 F (37.3 C)  TempSrc: Oral Oral Oral Oral  Resp: 18 18 20 18   Height:      Weight:      SpO2: 100% 100% 99% 99%    Wt Readings from Last 3 Encounters:  11/23/13 91.173 kg (201 lb)  11/23/13 91.173 kg (201 lb)  11/03/13 96.163 kg (212 lb)     Intake/Output Summary (Last 24 hours) at 11/26/13 1456 Last data filed at 11/26/13 1116  Gross per 24 hour  Intake   1100 ml  Output   1385 ml  Net   -285 ml     Physical Exam  Awake Alert, Oriented X 3, No new F.N deficits, Normal affect [place].AT,PERRAL Supple Neck,No JVD, No cervical lymphadenopathy appriciated.  Symmetrical Chest wall movement, Good air movement bilaterally, CTAB RRR,No Gallops,Rubs or new Murmurs, No Parasternal Heave +ve B.Sounds, Abd Soft, No tenderness, No organomegaly appriciated, No rebound - guarding or rigidity. No Cyanosis, Clubbing or edema, No new Rash or bruise   Right lower extremity weakness, 2-3 out 5 and proximal part, unable to lift his right foot of the bed, remainder of extremities have 5 out of 5 strength.  Data Review   Micro Results Recent Results (from the past 240 hour(s))  SURGICAL PCR SCREEN     Status: Abnormal   Collection Time    11/25/13  3:58 AM      Result Value Ref Range Status   MRSA, PCR NEGATIVE  NEGATIVE Final   Staphylococcus  aureus POSITIVE (*) NEGATIVE Final   Comment:            The Xpert SA Assay (FDA     approved for NASAL specimens     in patients over 39 years of age),     is one component of     a comprehensive surveillance     program.  Test performance has     been validated by Cory Weber for patients greater     than or equal to 70 year old.     It is not intended     to diagnose infection nor to     guide or monitor  treatment.    Radiology Reports Dg Lumbar Spine 2-3 Views  11/25/2013   CLINICAL DATA:  63 year old male with lumbar stenosis.  EXAM: LUMBAR SPINE - 2-3 VIEW  COMPARISON:  None.  FINDINGS: Intraoperative fluoroscopic spot images.  Posterior lumbar interbody fusion with bilateral pedicle screw and rod fixation of mid lumbar level, potentially L3-L4. No immediate complicating feature.  IMPRESSION: Intraoperative fluoroscopic spot images of posterior lumbar interbody fusion with bilateral pedicle screw and rod fixation of mid lumbar level. No immediate complicating feature.  Signed,  Dulcy Fanny. Earleen Newport, DO  [facility]  [facility]   Electronically Signed   By: Corrie Mckusick D.O.   On: 11/25/2013 20:21   Dg C-arm 61-120 Min  11/25/2013   CLINICAL DATA:  63 year old male with lumbar surgery  EXAM: DG C-ARM 61-120 MIN  COMPARISON:  None.  FINDINGS: Limited intraoperative spot images performed.  Surgical changes of posterior lumbar interbody fusion with bilateral pedicle screw and rod fixation of mid lumbar level, potentially L3-L4. No immediate complication identified.  IMPRESSION: Limited fluoroscopic spot images performed intraoperatively. Changes of posterior lumbar interbody fusion with bilateral pedicle screw and rod fixation of mid lumbar level without immediate complicating feature.  Signed,  Dulcy Fanny. Earleen Newport, DO  [facility]  [facility]   Electronically Signed   By: Corrie Mckusick D.O.   On:  11/25/2013 20:20    CBC  Recent Labs Lab 11/23/13 1211 11/24/13 0335 11/25/13 0549 11/26/13 0510  WBC 7.7 7.7 10.2 12.3*  HGB 7.7* 9.8* 9.7* 9.5*  HCT 25.8* 31.7* 30.8* 31.3*  PLT 336 338 318 311  MCV 76.3* 78.5 75.7* 79.4  MCH 22.8* 24.3* 23.8* 24.1*  MCHC 29.8* 30.9 31.5 30.4  RDW 16.8* 17.0* 16.8* 17.3*  LYMPHSABS 1.6  --   --   --   MONOABS 0.9  --   --   --   EOSABS 0.1  --   --   --   BASOSABS 0.0  --   --   --     Chemistries   Recent Labs Lab 11/23/13 1211 11/25/13 0549 11/26/13 0510  NA 135* 133* 133*  K 4.1 4.3 4.0  CL 98 97 96  CO2 26 24 25   GLUCOSE 222* 286* 196*  BUN 12 17 16   CREATININE 0.97 0.89 1.08  CALCIUM 9.2 9.2 9.3   ------------------------------------------------------------------------------------------------------------------ estimated creatinine clearance is 81.2 ml/min (by C-G formula based on Cr of 1.08). ------------------------------------------------------------------------------------------------------------------  Recent Labs  11/23/13 2128  HGBA1C 6.5*   ------------------------------------------------------------------------------------------------------------------  Recent Labs  11/24/13 0335  CHOL 127  HDL 44  LDLCALC 62  TRIG 103  CHOLHDL 2.9   ------------------------------------------------------------------------------------------------------------------ No results found for this basename: TSH, T4TOTAL, FREET3, T3FREE, THYROIDAB,  in the last 72 hours ------------------------------------------------------------------------------------------------------------------  Recent Labs  11/24/13 0335  VITAMINB12 493  FOLATE >20.0  FERRITIN 5*  TIBC 567*  IRON 62  RETICCTPCT 1.5    Coagulation profile  Recent Labs Lab 11/23/13 1211  INR 1.09    No results found for this basename: DDIMER,  in the last 72 hours  Cardiac Enzymes  Recent Labs Lab 11/23/13 1211  TROPONINI <0.30    ------------------------------------------------------------------------------------------------------------------ No components found with this basename: POCBNP,      Time Spent in minutes   25 minutes   Kelvin Cellar M.D on 11/26/2013 at 2:56 PM  Between 7am to 7pm - Pager - [phone]  After 7pm go to [email or web address] - password [email or web address]  And look for the night coverage person covering for me after hours  [facility] Office  [phone]   **Disclaimer: This note may have been dictated with voice recognition software. Similar sounding words can inadvertently be transcribed and this note may contain transcription errors which may not have been corrected upon publication of note.**

## 2013-11-26 NOTE — Progress Notes (Signed)
Inpatient Diabetes Program Recommendations  AACE/ADA: New Consensus Statement on Inpatient Glycemic Control (2013)  Target Ranges:  Prepandial:   less than 140 mg/dL      Peak postprandial:   less than 180 mg/dL (1-2 hours)      Critically ill patients:  140 - 180 mg/dL   Reason for Assessment:  Results for Cory Weber, Cory Weber (MRN [id number]) as of 11/26/2013 13:46  Ref. Range 11/25/2013 12:04 11/25/2013 19:55 11/25/2013 21:22 11/26/2013 06:37 11/26/2013 11:12  Glucose-Capillary Latest Range: 70-99 mg/dL 137 (H) 222 (H) 219 (H) 202 (H) 158 (H)  Results for Cory Weber, Cory Weber (MRN [id number]) as of 11/26/2013 13:46  Ref. Range 11/23/2013 21:28  Hemoglobin A1C Latest Range: <5.7 % 6.5 (H)    Diabetes history: Type 2 diabetes Outpatient Diabetes medications: Glucotrol 5 mg bid, Metformin 500 mg bid Current orders for Inpatient glycemic control:  Lantus 12 units q HS, Novolog moderate tid with meals and HS  CBG's improved.  A1C indicates great control of CBG's prior to admit.  He did receive Decadron post surgery which may have contributed to elevated CBG's.  Also oral medications are also on hold.  While in hospital may consider increasing Lantus to 18 units daily.  However he will likely not need insulin at home based on A1C.    Thanks, Adah Perl, RN, BC-ADM Inpatient Diabetes Coordinator Pager [phone]

## 2013-11-26 NOTE — Consult Note (Signed)
Physical Medicine and Rehabilitation Consult  Reason for Consult: Lumbar spondylolisthesis with HNP and RLE radiculopathy Referring Physician:  Dr. Ronnald Ramp.    HPI: Cory Weber is a 63 y.o. male with history of DM type 2 with peripheral neuropathy, CVA with residual RLE weakness, spondylolisthesis at L3/4 with a previous disc rupture with a superior free fragment at L3-4 on the right 2 years ago. He was admitted via [facility] on 11/24/13 with three day history of falls with worsening of RLE weakness and difficulty walking. He was noted to be anemic with Hgb 7.7 and was transfused with 2 units PRBC for acute on chronic anemia.  MRI spine done revealing spondylolisthesis with L3-4 spinal stenosis and an inferior free fragment compressing the right L4 nerve root and surgical decompression recommended by Dr. Ronnald Ramp. He was taken to OR for decompressive lam L3-L4 with PLIF on 11/25/13. Post op continues to have RLE weakness as well as with difficulty with mobility. MD recommending CIR for progressive therapy.   Patient was up with therapy postop day #1 ambulating min to mod assist to 12 feet. Still has numbness in the right thigh as well as weakness in the right lower  Review of Systems  HENT: Negative for hearing loss.   Eyes: Positive for blurred vision.  Respiratory: Negative for cough and shortness of breath.   Cardiovascular: Negative for chest pain, palpitations and claudication.  Gastrointestinal: Positive for heartburn, abdominal pain (due to constipation) and constipation. Negative for nausea and vomiting.  Genitourinary: Positive for urgency and frequency.  Musculoskeletal: Positive for joint pain and myalgias.  Neurological: Positive for sensory change (Tingling bilateral feet--chronic. RLE numbness/tingling since 3 days PTA. ) and focal weakness. Negative for headaches.  Psychiatric/Behavioral: The patient does not have insomnia.      Past Medical History  Diagnosis Date  .  Diabetes mellitus   . Hypertension   . Hepatitis C     states he was diagnosed in 2007 or 2007 while living in [place], [place]  . CVA (cerebral infarction) 01/2011  . Hyperlipidemia   . Asthma   . PVD (peripheral vascular disease)   . Anemia   . Stroke   . [id number])     Past Surgical History  Procedure Laterality Date  . Liver biopsy  2005    Done in [place], [place]. Chronic hepatitis with mild periportal inflammation, lobular unicellular necrosis and portal fibrosis. Grade 2, stage 1-2.  Marland Kitchen Colonoscopy with propofol N/A 09/03/2012    [id number] polyp-removed as outlined above. Prominent internal hemorrhoids. Tubular adenoma  . Esophagogastroduodenoscopy (egd) with propofol N/A 09/03/2012    [id number] hernia. Gastric diverticulum. Gastric ulcers with associated erosions. Duodenal erosions. Status post gastric biopsy. H.PYLORI gastritis   . Esophageal biopsy N/A 09/03/2012    Procedure: BIOPSY;  Surgeon: Daneil Dolin, MD;  Location: [facility];  Service: Endoscopy;  Laterality: N/A;  gastric and gastric mucosa  . Polypectomy N/A 09/03/2012    Procedure: POLYPECTOMY;  Surgeon: Daneil Dolin, MD;  Location: [facility];  Service: Endoscopy;  Laterality: N/A;  cecal polyp  . Esophagogastroduodenoscopy (egd) with propofol N/A 12/03/2012    Procedure: ESOPHAGOGASTRODUODENOSCOPY (EGD) WITH PROPOFOL;  Surgeon: Daneil Dolin, MD;  Location: [facility];  Service: Endoscopy;  Laterality: N/A;  . Esophageal biopsy N/A 12/03/2012    Procedure: BIOPSY;  Surgeon: Daneil Dolin, MD;  Location: [facility];  Service: Endoscopy;  Laterality: N/A;    Family History  Problem Relation Age of Onset  .  Breast cancer Mother     deceased  . Cancer Mother   . Diabetes Father   . Hypertension Father   . Heart disease Father     deceased  . Hyperlipidemia Father   . Diabetes Sister   . Hypertension Sister   . Breast cancer Sister   . Colon cancer Neg Hx   . Liver disease Neg Hx     Social History:  Disabled  due to stroke. 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  Lives alone. Sedentary--gait distance limited due to RLE weakness but independent PTA-. He   reports that he has been smoking Cigarettes--1 pack/2 days.   He has a 10 pack-year smoking history. He has never used smokeless tobacco. He denies using alcohol or illicit drugs--but reports of ETOH and cocaine use in the past.    Allergies: No Known Allergies  Medications Prior to Admission  Medication Sig Dispense Refill  . aspirin 81 MG tablet Take 81 mg by mouth daily.      Marland Kitchen esomeprazole (NEXIUM) 40 MG capsule TAKE ONE CAPSULE BY MOUTH DAILY BEFORE BREAKFAST.  30 capsule  11  . gabapentin (NEURONTIN) 300 MG capsule Take 300 mg by mouth 2 (two) times daily.      Marland Kitchen glipiZIDE (GLUCOTROL) 2.5 mg TABS Take 5 mg by mouth 2 (two) times daily before a meal.       . Linaclotide (LINZESS) 290 MCG CAPS capsule Take 1 capsule (290 mcg total) by mouth daily. 30 minutes before breakfast.  30 capsule  3  . lisinopril-hydrochlorothiazide (PRINZIDE,ZESTORETIC) 20-12.5 MG per tablet Take 1 tablet by mouth 2 (two) times daily.      . metFORMIN (GLUCOPHAGE) 500 MG tablet Take 500 mg by mouth 2 (two) times daily with a meal.      . simvastatin (ZOCOR) 20 MG tablet Take 20 mg by mouth daily at 6 PM.      . amLODipine (NORVASC) 5 MG tablet Take 1 tablet (5 mg total) by mouth daily.  90 tablet  1    Home: Home Living Family/patient expects to be discharged to:: Skilled nursing facility (from home) Living Arrangements: Alone Available Help at Discharge: Family;Available PRN/intermittently Type of Home: House Home Access: Stairs to enter Entrance Stairs-Number of Steps: 3 Entrance Stairs-Rails: None Home Layout: One level Home Equipment: Cane - single point  Functional History: Prior Function Level of Independence: Independent Comments: works Architect, now on disability Functional Status:  Mobility: Bed Mobility Overal bed  mobility: Needs Assistance Bed Mobility: Rolling;Sidelying to Sit Rolling: Min guard Sidelying to sit: Min assist;HOB elevated General bed mobility comments: cues for technique, assisted right leg off bed, cues to use rail Transfers Overall transfer level: Needs assistance Transfers: Sit to/from Stand Sit to Stand: Mod assist General transfer comment: cues for UE's on bed, and to relay on left leg, increased time to transition hands to walker Ambulation/Gait Ambulation/Gait assistance: Min assist;Mod assist Ambulation Distance (Feet): 12 Feet Assistive device: Rolling walker (2 wheeled) Gait Pattern/deviations: Step-to pattern;Decreased step length - left;Trunk flexed General Gait Details: cues for sequence and to rely on UE's for stability due to right LE weakness, noted some difficulty with right LE placement with decreased proprioception    ADL:    Cognition: Cognition Overall Cognitive Status: Within Functional Limits for tasks assessed Orientation Level: Oriented X4 Cognition Arousal/Alertness: Awake/alert Behavior During Therapy: WFL for tasks assessed/performed Overall Cognitive Status: Within Functional Limits for tasks assessed  Blood pressure 126/99,  pulse 73, temperature 99.3 F (37.4 C), temperature source Oral, resp. rate 20, height 6\' 1"  (1.854 m), weight 91.173 kg (201 lb), SpO2 99.00%. Physical Exam  Nursing note and vitals reviewed. Constitutional: He is oriented to person, place, and time. He appears well-developed and well-nourished.  HENT:  Head: Normocephalic and atraumatic.  Eyes: Conjunctivae are normal. Pupils are equal, round, and reactive to light.  Neck: Normal range of motion. Neck supple.  Cardiovascular: Normal rate and regular rhythm.   Respiratory: Effort normal and breath sounds normal. No respiratory distress. He has no wheezes.  GI: Soft. Bowel sounds are normal. He exhibits no distension. There is no tenderness.  Musculoskeletal: He  exhibits no edema and no tenderness.  Neurological: He is alert and oriented to person, place, and time.  Follow commands without difficulty. Sensory deficits RLE with weakness.   Skin: Skin is warm and dry.   motor strength is 5/5 bilateral deltoid, biceps, triceps, grip 5/5 left hip flexor, knee extensor ankle dorsiflexor 2 minus hip flexor right, 2 minus knee extensor right, 4/5 ankle dorsi and plantar flexor right Sensation reduced in the right L3 dermatomal distribution  Results for orders placed during the hospital encounter of 11/23/13 (from the past 24 hour(s))  GLUCOSE, CAPILLARY     Status: Abnormal   Collection Time    11/25/13 12:04 PM      Result Value Ref Range   Glucose-Capillary 137 (*) 70 - 99 mg/dL   Comment 1 Notify RN     Comment 2 Documented in Chart    GLUCOSE, CAPILLARY     Status: Abnormal   Collection Time    11/25/13  7:55 PM      Result Value Ref Range   Glucose-Capillary 222 (*) 70 - 99 mg/dL  GLUCOSE, CAPILLARY     Status: Abnormal   Collection Time    11/25/13  9:22 PM      Result Value Ref Range   Glucose-Capillary 219 (*) 70 - 99 mg/dL   Comment 1 Documented in Chart     Comment 2 Notify RN    BASIC METABOLIC PANEL     Status: Abnormal   Collection Time    11/26/13  5:10 AM      Result Value Ref Range   Sodium 133 (*) 137 - 147 mEq/L   Potassium 4.0  3.7 - 5.3 mEq/L   Chloride 96  96 - 112 mEq/L   CO2 25  19 - 32 mEq/L   Glucose, Bld 196 (*) 70 - 99 mg/dL   BUN 16  6 - 23 mg/dL   Creatinine, Ser 1.08  0.50 - 1.35 mg/dL   Calcium 9.3  8.4 - 10.5 mg/dL   GFR calc non Af Amer 72 (*) >90 mL/min   GFR calc Af Amer 84 (*) >90 mL/min   Anion gap 12  5 - 15  CBC     Status: Abnormal   Collection Time    11/26/13  5:10 AM      Result Value Ref Range   WBC 12.3 (*) 4.0 - 10.5 K/uL   RBC 3.94 (*) 4.22 - 5.81 MIL/uL   Hemoglobin 9.5 (*) 13.0 - 17.0 g/dL   HCT 31.3 (*) 39.0 - 52.0 %   MCV 79.4  78.0 - 100.0 fL   MCH 24.1 (*) 26.0 - 34.0 pg    MCHC 30.4  30.0 - 36.0 g/dL   RDW 17.3 (*) 11.5 - 15.5 %   Platelets 311  150 - 400 K/uL  GLUCOSE, CAPILLARY     Status: Abnormal   Collection Time    11/26/13  6:37 AM      Result Value Ref Range   Glucose-Capillary 202 (*) 70 - 99 mg/dL   Comment 1 Documented in Chart     Comment 2 Notify RN    GLUCOSE, CAPILLARY     Status: Abnormal   Collection Time    11/26/13 11:12 AM      Result Value Ref Range   Glucose-Capillary 158 (*) 70 - 99 mg/dL   Comment 1 Notify RN     Dg Lumbar Spine 2-3 Views  11/25/2013   CLINICAL DATA:  63 year old male with lumbar stenosis.  EXAM: LUMBAR SPINE - 2-3 VIEW  COMPARISON:  None.  FINDINGS: Intraoperative fluoroscopic spot images.  Posterior lumbar interbody fusion with bilateral pedicle screw and rod fixation of mid lumbar level, potentially L3-L4. No immediate complicating feature.  IMPRESSION: Intraoperative fluoroscopic spot images of posterior lumbar interbody fusion with bilateral pedicle screw and rod fixation of mid lumbar level. No immediate complicating feature.  Signed,  Dulcy Fanny. Earleen Newport, DO  [facility]  [facility]   Electronically Signed   By: Corrie Mckusick D.O.   On: 11/25/2013 20:21   Dg C-arm 61-120 Min  11/25/2013   CLINICAL DATA:  63 year old male with lumbar surgery  EXAM: DG C-ARM 61-120 MIN  COMPARISON:  None.  FINDINGS: Limited intraoperative spot images performed.  Surgical changes of posterior lumbar interbody fusion with bilateral pedicle screw and rod fixation of mid lumbar level, potentially L3-L4. No immediate complication identified.  IMPRESSION: Limited fluoroscopic spot images performed intraoperatively. Changes of posterior lumbar interbody fusion with bilateral pedicle screw and rod fixation of mid lumbar level without immediate complicating feature.  Signed,  Dulcy Fanny. Earleen Newport, DO  [facility]  [facility]   Electronically Signed   By:  Corrie Mckusick D.O.   On: 11/25/2013 20:20    Assessment/Plan: Diagnosis: Right L3 radiculopathy with sensory as well as motor deficits postop day #1 status post L3-L4 PLIF 1. Does the need for close, 24 hr/day medical supervision in concert with the patient's rehab needs make it unreasonable for this patient to be served in a less intensive setting? Potentially 2. Co-Morbidities requiring supervision/potential complications: Diabetes type 2, anemia requiring transfusion, history of polysubstance abuse, chronic hepatitis C 3. Due to bladder management, bowel management, safety, skin/wound care, disease management, medication administration, pain management and patient education, does the patient require 24 hr/day rehab nursing? Potentially 4. Does the patient require coordinated care of a physician, rehab nurse, PT and OT to address physical and functional deficits in the context of the above medical diagnosis(es)? Potentially Addressing deficits in the following areas: balance, endurance, locomotion, strength, transferring, bowel/bladder control, bathing, dressing, feeding, grooming, toileting and cognition 5. Can the patient actively participate in an intensive therapy program of at least 3 hrs of therapy per day at least 5 days per week? Potentially 6. The potential for patient to make measurable gains while on inpatient rehab is good 7. Anticipated functional outcomes upon discharge from inpatient rehab are modified independent  with PT, modified independent with OT, n/a with SLP. 8. Estimated rehab length of stay to reach the above functional goals is: ?5-7d 9. Does the patient have adequate social supports to accommodate these discharge functional goals? Potentially 10. Anticipated D/C setting: Home 11. Anticipated post D/C treatments: Horine therapy 12. Overall Rehab/Functional  Prognosis: good  RECOMMENDATIONS: This patient's condition is appropriate for continued rehabilitative care in the  following setting: reassess in a couple days, if still at min mod assist level then CIR recommended otherwise home with home health Patient has agreed to participate in recommended program. Potentially Note that insurance prior authorization may be required for reimbursement for recommended care.  Comment: Rehabilitation admission coordinator to followup on therapy progress    11/26/2013

## 2013-11-26 NOTE — Progress Notes (Signed)
Patient ID: Cory Weber, male   DOB: 10-14-1950, 63 y.o.   MRN: [id number] Subjective: Patient reports appropriate back soreness, no leg pain, no change in pre-op leg weakness.  Objective: Vital signs in last 24 hours: Temp:  [97.7 F (36.5 C)-98.5 F (36.9 C)] 97.8 F (36.6 C) (10/16 0500) Pulse Rate:  [55-101] 74 (10/16 0500) Resp:  [15-20] 18 (10/16 0500) BP: (124-165)/(70-94) 124/80 mmHg (10/16 0500) SpO2:  [96 %-100 %] 100 % (10/16 0500)  Intake/Output from previous day: 10/15 0701 - 10/16 0700 In: 1100 [I.V.:1100] Out: 1950 [Urine:1850; Blood:100] Intake/Output this shift:    He seems to have good dorsi and plantar flexion as well as hip flexion but has 1/5 right quadriceps strength which is similar to preop  Lab Results: Lab Results  Component Value Date   WBC 12.3* 11/26/2013   HGB 9.5* 11/26/2013   HCT 31.3* 11/26/2013   MCV 79.4 11/26/2013   PLT 311 11/26/2013   Lab Results  Component Value Date   INR 1.09 11/23/2013   BMET Lab Results  Component Value Date   NA 133* 11/26/2013   K 4.0 11/26/2013   CL 96 11/26/2013   CO2 25 11/26/2013   GLUCOSE 196* 11/26/2013   BUN 16 11/26/2013   CREATININE 1.08 11/26/2013   CALCIUM 9.3 11/26/2013    Studies/Results: Dg Lumbar Spine 2-3 Views  11/25/2013   CLINICAL DATA:  63 year old male with lumbar stenosis.  EXAM: LUMBAR SPINE - 2-3 VIEW  COMPARISON:  None.  FINDINGS: Intraoperative fluoroscopic spot images.  Posterior lumbar interbody fusion with bilateral pedicle screw and rod fixation of mid lumbar level, potentially L3-L4. No immediate complicating feature.  IMPRESSION: Intraoperative fluoroscopic spot images of posterior lumbar interbody fusion with bilateral pedicle screw and rod fixation of mid lumbar level. No immediate complicating feature.  Signed,  Dulcy Fanny. Earleen Newport, DO  [facility]  [facility]   Electronically Signed   By: Corrie Mckusick D.O.   On:  11/25/2013 20:21   Dg C-arm 61-120 Min  11/25/2013   CLINICAL DATA:  63 year old male with lumbar surgery  EXAM: DG C-ARM 61-120 MIN  COMPARISON:  None.  FINDINGS: Limited intraoperative spot images performed.  Surgical changes of posterior lumbar interbody fusion with bilateral pedicle screw and rod fixation of mid lumbar level, potentially L3-L4. No immediate complication identified.  IMPRESSION: Limited fluoroscopic spot images performed intraoperatively. Changes of posterior lumbar interbody fusion with bilateral pedicle screw and rod fixation of mid lumbar level without immediate complicating feature.  Signed,  Dulcy Fanny. Earleen Newport, DO  [facility]  [facility]   Electronically Signed   By: Corrie Mckusick D.O.   On: 11/25/2013 20:20    Assessment/Plan: Seems to be doing well after L3-4 PLIF last evening. Stable right quadricep weakness. He likely will need a knee brace. Awaiting physical and outpatient therapy. He may need rehabilitation or home health physical therapy. His quadriceps strength is not likely to return quickly.  LOS: 3 days    Cory Weber,Cory Weber 11/26/2013, 9:50 AM

## 2013-11-26 NOTE — Evaluation (Signed)
Physical Therapy Evaluation Patient Details Name: Cory Weber MRN: [id number] DOB: [birth year] Today's Date: 11/26/2013   History of Present Illness  Patient is a 63 y/o male admitted with three-day history of right leg pain with right leg weakness. He has a history of spondylolisthesis at L3-4 and had a previous disc rupture with a superior free fragment at L3-4 on the right 2 years ago.  Now s/p decompressive lumbar laminectomy L2-3 with PLIF.   Clinical Impression  Patient presents with right LE weakness and decreased sensation limiting independence with mobility and safety with high fall risk.  Will benefit from skilled PT in the acute setting to allow return home following SNF level rehab stay.    Follow Up Recommendations SNF    Equipment Recommendations  None recommended by PT    Recommendations for Other Services       Precautions / Restrictions Precautions Precautions: Fall;Back Precaution Booklet Issued: No Precaution Comments: verbal instructions given      Mobility  Bed Mobility Overal bed mobility: Needs Assistance Bed Mobility: Rolling;Sidelying to Sit Rolling: Min guard Sidelying to sit: Min assist;HOB elevated       General bed mobility comments: cues for technique, assisted right leg off bed, cues to use rail  Transfers Overall transfer level: Needs assistance   Transfers: Sit to/from Stand Sit to Stand: Mod assist         General transfer comment: cues for UE's on bed, and to relay on left leg, increased time to transition hands to walker  Ambulation/Gait Ambulation/Gait assistance: Min assist;Mod assist Ambulation Distance (Feet): 12 Feet Assistive device: Rolling walker (2 wheeled) Gait Pattern/deviations: Step-to pattern;Decreased step length - left;Trunk flexed     General Gait Details: cues for sequence and to rely on UE's for stability due to right LE weakness, noted some difficulty with right LE placement with decreased  proprioception  Stairs            Wheelchair Mobility    Modified Rankin (Stroke Patients Only)       Balance Overall balance assessment: Needs assistance         Standing balance support: Bilateral upper extremity supported Standing balance-Leahy Scale: Poor Standing balance comment: UE assist needed due to LE weakness                             Pertinent Vitals/Pain Pain Assessment: 0-10 Pain Score: 8  Pain Location: back surgical site Pain Intervention(s): Monitored during session    Home Living Family/patient expects to be discharged to:: Skilled nursing facility (from home) Living Arrangements: Alone Available Help at Discharge: Family;Available PRN/intermittently Type of Home: House Home Access: Stairs to enter Entrance Stairs-Rails: None Entrance Stairs-Number of Steps: 3 Home Layout: One level Home Equipment: Cane - single point      Prior Function Level of Independence: Independent         Comments: works Architect, now on disability     Journalist, newspaper   Dominant Hand: Left    Extremity/Trunk Assessment               Lower Extremity Assessment: RLE deficits/detail RLE Deficits / Details: AAROM WFL, strength hip flexion 2/5, knee extension 2-/5, ankle DF 3-/5       Communication   Communication: No difficulties  Cognition Arousal/Alertness: Awake/alert Behavior During Therapy: WFL for tasks assessed/performed Overall Cognitive Status: Within Functional Limits for tasks assessed  General Comments      Exercises        Assessment/Plan    PT Assessment Patient needs continued PT services  PT Diagnosis Generalized weakness;Difficulty walking   PT Problem List Decreased strength;Decreased activity tolerance;Decreased balance;Decreased mobility;Decreased knowledge of use of DME;Decreased knowledge of precautions;Pain  PT Treatment Interventions DME instruction;Gait  training;Functional mobility training;Stair training;Balance training;Therapeutic exercise;Therapeutic activities;Patient/family education   PT Goals (Current goals can be found in the Care Plan section) Acute Rehab PT Goals Patient Stated Goal: To go to rehab and return to independent PT Goal Formulation: With patient Time For Goal Achievement: 12/10/13 Potential to Achieve Goals: Good    Frequency Min 5X/week   Barriers to discharge Decreased caregiver support      Co-evaluation               End of Session Equipment Utilized During Treatment: Gait belt Activity Tolerance: Patient tolerated treatment well Patient left: in chair;with call bell/phone within reach Nurse Communication: Mobility status         Time: [id number] PT Time Calculation (min): 33 min   Charges:   PT Evaluation $Initial PT Evaluation Tier I: 1 Procedure PT Treatments $Gait Training: 8-22 mins   PT G Codes:          Jodi Criscuolo,CYNDI 16-Dec-2013, 10:26 AM Magda Kiel, Martin 2013-12-16

## 2013-11-27 ENCOUNTER — Inpatient Hospital Stay (HOSPITAL_COMMUNITY): Payer: Medicaid Other

## 2013-11-27 DIAGNOSIS — R5081 Fever presenting with conditions classified elsewhere: Secondary | ICD-10-CM

## 2013-11-27 LAB — URINALYSIS, ROUTINE W REFLEX MICROSCOPIC
Bilirubin Urine: NEGATIVE
Glucose, UA: 100 mg/dL — AB
HGB URINE DIPSTICK: NEGATIVE
Ketones, ur: NEGATIVE mg/dL
Leukocytes, UA: NEGATIVE
NITRITE: NEGATIVE
PROTEIN: NEGATIVE mg/dL
Specific Gravity, Urine: 1.006 (ref 1.005–1.030)
UROBILINOGEN UA: 1 mg/dL (ref 0.0–1.0)
pH: 5.5 (ref 5.0–8.0)

## 2013-11-27 LAB — BASIC METABOLIC PANEL
ANION GAP: 10 (ref 5–15)
BUN: 15 mg/dL (ref 6–23)
CHLORIDE: 93 meq/L — AB (ref 96–112)
CO2: 28 meq/L (ref 19–32)
Calcium: 9.1 mg/dL (ref 8.4–10.5)
Creatinine, Ser: 1.18 mg/dL (ref 0.50–1.35)
GFR calc Af Amer: 75 mL/min — ABNORMAL LOW (ref >90)
GFR calc non Af Amer: 65 mL/min — ABNORMAL LOW (ref >90)
Glucose, Bld: 163 mg/dL — ABNORMAL HIGH (ref 70–99)
Potassium: 4.1 mEq/L (ref 3.7–5.3)
SODIUM: 131 meq/L — AB (ref 137–147)

## 2013-11-27 LAB — GLUCOSE, CAPILLARY
GLUCOSE-CAPILLARY: 225 mg/dL — AB (ref 70–99)
GLUCOSE-CAPILLARY: 172 mg/dL — AB (ref 70–99)
Glucose-Capillary: 185 mg/dL — ABNORMAL HIGH (ref 70–99)
Glucose-Capillary: 162 mg/dL — ABNORMAL HIGH (ref 70–99)

## 2013-11-27 LAB — LACTIC ACID, PLASMA: Lactic Acid, Venous: 1.3 mmol/L (ref 0.5–2.2)

## 2013-11-27 LAB — CBC
HCT: 30.8 % — ABNORMAL LOW (ref 39.0–52.0)
Hemoglobin: 9.4 g/dL — ABNORMAL LOW (ref 13.0–17.0)
MCH: 23.8 pg — ABNORMAL LOW (ref 26.0–34.0)
MCHC: 30.5 g/dL (ref 30.0–36.0)
MCV: 78 fL (ref 78.0–100.0)
PLATELETS: 292 10*3/uL (ref 150–400)
RBC: 3.95 MIL/uL — AB (ref 4.22–5.81)
RDW: 17.6 % — ABNORMAL HIGH (ref 11.5–15.5)
WBC: 11.8 10*3/uL — AB (ref 4.0–10.5)

## 2013-11-27 MED ORDER — PIPERACILLIN-TAZOBACTAM 3.375 G IVPB: 3.3750 g | Freq: Four times a day (QID) | INTRAVENOUS | Status: DC

## 2013-11-27 MED ORDER — VANCOMYCIN HCL 10 G IV SOLR
1250.0000 mg | Freq: Two times a day (BID) | INTRAVENOUS | Status: DC
  Administered 2013-11-28 – 2013-11-29 (×2): 1250 mg via INTRAVENOUS
  Filled 2013-11-27 (×4): qty 1250

## 2013-11-27 MED ORDER — IOHEXOL 300 MG/ML  SOLN
25.0000 mL | INTRAMUSCULAR | Status: DC
Start: 2013-11-27 — End: 2013-11-27

## 2013-11-27 MED ORDER — IOHEXOL 300 MG/ML  SOLN: 25.0000 mL | INTRAMUSCULAR | Status: DC

## 2013-11-27 MED ORDER — IOHEXOL 300 MG/ML  SOLN
25.0000 mL | INTRAMUSCULAR | Status: AC
  Administered 2013-11-27: 25 mL via ORAL

## 2013-11-27 MED ORDER — PIPERACILLIN-TAZOBACTAM 3.375 G IVPB
3.3750 g | Freq: Three times a day (TID) | INTRAVENOUS | Status: DC
  Administered 2013-11-27 – 2013-11-29 (×6): 3.375 g via INTRAVENOUS
  Filled 2013-11-27 (×10): qty 50

## 2013-11-27 MED ORDER — VANCOMYCIN HCL IN DEXTROSE 1-5 GM/200ML-% IV SOLN
1000.0000 mg | Freq: Three times a day (TID) | INTRAVENOUS | Status: DC
  Administered 2013-11-27: 1000 mg via INTRAVENOUS
  Filled 2013-11-27 (×3): qty 200

## 2013-11-27 MED ORDER — IOHEXOL 300 MG/ML  SOLN
100.0000 mL | Freq: Once | INTRAMUSCULAR | Status: AC | PRN
  Administered 2013-11-27: 100 mL via INTRAVENOUS

## 2013-11-27 MED ORDER — VANCOMYCIN HCL 10 G IV SOLR
1500.0000 mg | Freq: Once | INTRAVENOUS | Status: AC
  Administered 2013-11-27: 1500 mg via INTRAVENOUS
  Filled 2013-11-27: qty 1500

## 2013-11-27 NOTE — Progress Notes (Signed)
No issues overnight. Pt has appropriate back soreness but improvement in right leg pain. Continues to say his "right leg doesn't work." Was able to [employer] with assistance of walker with PT. No dysuria.  EXAM:  BP 140/93  Pulse 97  Temp(Src) 99.7 F (37.6 C) (Oral)  Resp 20  Ht 6\' 1"  (1.854 m)  Wt 91.173 kg (201 lb)  BMI 26.52 kg/m2  SpO2 96%  Awake, alert, oriented  Speech fluent, appropriate  CN grossly intact  5/5 BUE/BLE x 1/5 left quad Wound c/d/i. No signs of infection  IMPRESSION:  63 y.o. male s/p L3-4 PLIF, with stable quad weakness - Febrile, may be atelectatic.  PLAN: - Blood/urine cx, CXR pending - Encouraged IS use - Cont to mobilize with PT/OT

## 2013-11-27 NOTE — Progress Notes (Addendum)
ANTIBIOTIC CONSULT NOTE - INITIAL  Pharmacy Consult for Vancomycin Indication: rule out sepsis  No Known Allergies  Patient Measurements: Height: 6\' 1"  (185.4 cm) Weight: 201 lb (91.173 kg) IBW/kg (Calculated) : 79.9  Vital Signs: Temp: 103.1 F (39.5 C) (10/17 0715) Temp Source: Oral (10/17 0715) BP: 129/85 mmHg (10/17 0615) Pulse Rate: 50 (10/17 0615) Intake/Output from previous day: 10/16 0701 - 10/17 0700 In: -  Out: 1135 [id number] Intake/Output from this shift:    Labs:  Recent Labs  11/25/13 0549 11/26/13 0510 11/27/13 0419  WBC 10.2 12.3* 11.8*  HGB 9.7* 9.5* 9.4*  PLT 318 311 292  CREATININE 0.89 1.08 1.18   Estimated Creatinine Clearance: 74.3 ml/min (by C-G formula based on Cr of 1.18). No results found for this basename: VANCOTROUGH, Corlis Leak, VANCORANDOM, GENTTROUGH, GENTPEAK, GENTRANDOM, TOBRATROUGH, TOBRAPEAK, TOBRARND, AMIKACINPEAK, AMIKACINTROU, AMIKACIN,  in the last 72 hours   Microbiology: Recent Results (from the past 720 hour(s))  SURGICAL PCR SCREEN     Status: Abnormal   Collection Time    11/25/13  3:58 AM      Result Value Ref Range Status   MRSA, PCR NEGATIVE  NEGATIVE Final   Staphylococcus aureus POSITIVE (*) NEGATIVE Final   Comment:            The Xpert SA Assay (FDA     approved for NASAL specimens     in patients over 12 years of age),     is one component of     a comprehensive surveillance     program.  Test performance has     been validated by [employer] for patients greater     than or equal to 33 year old.     It is not intended     to diagnose infection nor to     guide or monitor treatment.    Medical History: Past Medical History  Diagnosis Date  . Diabetes mellitus   . Hypertension   . Hepatitis C     states he was diagnosed in 2007 or 2007 while living in [place], [place]  . CVA (cerebral infarction) 01/2011  . Hyperlipidemia   . Asthma   . PVD (peripheral vascular disease)   . Anemia   .  Stroke   . Headache(784.0)     Medications:  Scheduled:  . amLODipine  5 mg Oral Daily  . gabapentin  300 mg Oral BID  . lisinopril  20 mg Oral BID   And  . hydrochlorothiazide  12.5 mg Oral BID  . insulin aspart  0-15 Units Subcutaneous TID WC  . insulin aspart  0-5 Units Subcutaneous QHS  . insulin glargine  12 Units Subcutaneous QHS  . Linaclotide  290 mcg Oral Daily  . pantoprazole  40 mg Oral BID  . piperacillin-tazobactam (ZOSYN)  IV  3.375 g Intravenous 3 times per day  . sodium chloride  3 mL Intravenous Q12H   Assessment: 64 yo male with fevers s/p back surgery for empiric antibiotics  Goal of Therapy:  Vancomycin trough level 15-20 mcg/ml  Plan:  Vancomycin 1500 mg IV now, then 1 g IV q8h  Abbott, Bronson Curb 11/27/2013,7:42 AM  -------------------------------------------------------------------------- Addendum:  Given slight worsening of renal function, SCr 1.18, CrCl~70-80 ml/min. Will adjust Vancomycin dose slightly.  Plan 1. Adjust Vancomycin to 1250 mg IV every 12 hours 2. Continue Zosyn 3.375g IV every 8 hours 3. Will continue to follow renal function, culture results, LOT, and antibiotic  de-escalation plans   Alycia Rossetti, PharmD, BCPS Clinical Pharmacist Pager: [phone] 11/27/2013 4:53 PM

## 2013-11-27 NOTE — Progress Notes (Signed)
Physical Therapy Treatment Patient Details Name: Cory Weber MRN: [id number] DOB: [birth year] Today's Date: 2013-12-12    History of Present Illness Patient is a 63 y/o male admitted with three-day history of right leg pain with right leg weakness. He has a history of spondylolisthesis at L3-4 and had a previous disc rupture with a superior free fragment at L3-4 on the right 2 years ago.  Now s/p decompressive lumbar laminectomy L2-3 with PLIF. Pt also with metatarsal fx on RLE from fall in last 3 days.    PT Comments    Patient seen for brief PT visit today, exercises only due to patient complaint of pain and not feeling well.  Patient had fever this morning, and imaging studies.  Supine AAROM exercises of R LE for quad strengthening.  Patient denies pain at Rt foot fracture site on palpation, light touch sensation is intact.  Patient is appropriate to continue skilled PT services.  Follow Up Recommendations  SNF     Equipment Recommendations  None recommended by PT (Will continue to assess)    Recommendations for Other Services       Precautions / Restrictions Precautions Precautions: Fall;Back Restrictions Weight Bearing Restrictions: No Other Position/Activity Restrictions: Ortho has not proceeded with consult to give WB precautions    Mobility  Bed Mobility                  Transfers                    Ambulation/Gait                 Stairs            Wheelchair Mobility    Modified Rankin (Stroke Patients Only)       Balance                                    Cognition Arousal/Alertness: Awake/alert Behavior During Therapy: WFL for tasks assessed/performed Overall Cognitive Status: Within Functional Limits for tasks assessed                      Exercises General Exercises - Lower Extremity Short Arc Quad: AAROM;Strengthening;Right;10 reps Heel Slides: AAROM;Strengthening;Right;10 reps;Supine Hip  Flexion/Marching: AAROM;Strengthening;Right;10 reps;Supine    General Comments        Pertinent Vitals/Pain Pain Assessment: 0-10 Pain Score: 7  Pain Location: Back Pain Descriptors / Indicators: Aching Pain Intervention(s): Limited activity within patient's tolerance;Repositioned    Home Living                      Prior Function            PT Goals (current goals can now be found in the care plan section)      Frequency  Min 5X/week    PT Plan Current plan remains appropriate    Co-evaluation             End of Session   Activity Tolerance: Patient limited by pain Patient left: in bed;with call bell/phone within reach;with nursing/sitter in room     Time: [id number] PT Time Calculation (min): 15 min  Charges:  $Therapeutic Exercise: 8-22 mins                    G Codes:      Amellia Panik L 12/12/13, 5:31 PM

## 2013-11-27 NOTE — Progress Notes (Signed)
Patient Demographics  Cory Weber, is a 63 y.o. male, DOB - September 12, 1950, [id number]  Admit date - 11/23/2013   Admitting Physician Kathie Dike, MD  Outpatient Primary MD for the patient is Maggie Font, MD  LOS - 4   Chief Complaint  Patient presents with  . extremity weakness       Brief narrative: Patient with known history of CVA, diabetes mellitus, hypertension, no residual deficits, presents with significant right lower extremity weakness, MRI of lumbar spine showing significant mass effect on the right L4 nerve root, no acute or subacute CVA on MRI brain, patient is being transferred to [facility] cone 4 ports under Dr. Ronnald Ramp care. Medicine will continue to follow as a consult.  On 11/27/2013 patient developing fever of 103.1, exam showing area of induration and pain involving left lower back   Subjective:   Cory Weber today having fevers, chills, Tmax of 103.1. He is awake and alert, complains of lower back pain. Had breakfast  Assessment & Plan    Active Problems:   DM (diabetes mellitus), type 2, uncontrolled   Accelerated hypertension   Tobacco abuse   Normocytic anemia   Right leg weakness   Lumbar spinal stenosis   S/P lumbar spinal fusion   Acute right lumbar radiculopathy   Fever presenting with conditions classified elsewhere  Fever -Overnight night patient developing fever, having TEMP of 103.1 -Had decompressive lumbar laminectomy on 11/25/2013, back examined, there is area of warmth, induration and pain in the left lower back region.  -Have ordered blood cultures, U/A, CXR in addition to CT scan.  -Started emperic IV antimicrobial therapy with Vancomycin and Zosyn.    right lower extremity weakness -Due to severe lumbar radiculopathy, with compression mass effect on the L4 right nerve root. MRI showing mass at L4 level with  compression of nerve root.  -There is no acute finding of MRI brain to suggest acute or subacute stroke, but patient is having significant atherosclerotic disease in multiple vessels in the MRA of the brain, will resume aspirin when patient is more stable. -Patient undergoing decompressive lumbar laminectomy on 11/25/2013, procedure performed by Dr. Ronnald Ramp of neurosurgery  Anemia -Patient is known to have history of iron deficiency anemia, with extensive workup in the past including colonoscopy and endoscopy in 2014, showing gastric ulcer and erosions. -Patient is Hemoccult negative x2 in ED, no evidence of active bleed, melena, or hematemesis. -Patient was transfused 2 units packed red blood cell 11/23/2013 -Hg remains stable at 9.4 on 11/27/2013  Diabetes mellitus -Seems to be controlled while inpatient, continue with insulin sliding scale.  Hypertension -Continue Amlodipine 5 mg PO q daily and Lisinopril 20 mg PO BID  Fifth metatarsal fracture -Patient having a fall several days prior to presentation, imaging showing nondisplaced fracture of proximal fifth metatarsal, in anatomic alignment, no dislocation -Ortho consult when stable  Code Status: Full  Family Communication:   Disposition Plan: Plan for decompressive surgery today   Procedures  Decompressive lumbar laminectomy on 11/25/2013   Medications  Scheduled Meds: . amLODipine  5 mg Oral Daily  . gabapentin  300 mg Oral BID  . lisinopril  20 mg Oral BID   And  . hydrochlorothiazide  12.5 mg Oral BID  .  insulin aspart  0-15 Units Subcutaneous TID WC  . insulin aspart  0-5 Units Subcutaneous QHS  . insulin glargine  12 Units Subcutaneous QHS  . Linaclotide  290 mcg Oral Daily  . pantoprazole  40 mg Oral BID  . piperacillin-tazobactam (ZOSYN)  IV  3.375 g Intravenous 3 times per day  . sodium chloride  3 mL Intravenous Q12H  . vancomycin  1,500 mg Intravenous Once  . vancomycin  1,000 mg Intravenous Q8H    Continuous Infusions: . sodium chloride    . 0.9 % NaCl with KCl 20 mEq / L 75 mL/hr at 11/27/13 0813   PRN Meds:.acetaminophen, acetaminophen, menthol-cetylpyridinium, methocarbamol (ROBAXIN) IV, methocarbamol, morphine injection, ondansetron (ZOFRAN) IV, oxyCODONE-acetaminophen, phenol, polyethylene glycol, senna-docusate, sodium chloride  DVT Prophylaxis  Heparin - SCDs   Lab Results  Component Value Date   PLT 292 11/27/2013    Antibiotics    Anti-infectives   Start     Dose/Rate Route Frequency Ordered Stop   11/27/13 1400  vancomycin (VANCOCIN) IVPB 1000 mg/200 mL premix     1,000 mg 200 mL/hr over 60 Minutes Intravenous Every 8 hours 11/27/13 0746     11/27/13 0800  piperacillin-tazobactam (ZOSYN) IVPB 3.375 g     3.375 g 12.5 mL/hr over 240 Minutes Intravenous 3 times per day 11/27/13 0732     11/27/13 0800  vancomycin (VANCOCIN) 1,500 mg in sodium chloride 0.9 % 500 mL IVPB     1,500 mg 250 mL/hr over 120 Minutes Intravenous  Once 11/27/13 0746     11/27/13 0730  piperacillin-tazobactam (ZOSYN) IVPB 3.375 g  Status:  Discontinued     3.375 g 12.5 mL/hr over 240 Minutes Intravenous 4 times per day 11/27/13 0723 11/27/13 0732   11/26/13 0500  vancomycin (VANCOCIN) IVPB 1000 mg/200 mL premix     1,000 mg 200 mL/hr over 60 Minutes Intravenous  Once 11/25/13 2051 11/26/13 0549   11/25/13 1805  bacitracin 50,000 Units in sodium chloride irrigation 0.9 % 500 mL irrigation  Status:  Discontinued       As needed 11/25/13 1805 11/25/13 1937   11/25/13 1730  vancomycin (VANCOCIN) IVPB 1000 mg/200 mL premix  Status:  Discontinued     1,000 mg 200 mL/hr over 60 Minutes Intravenous To Neuro OR-Station #32 11/25/13 1727 11/25/13 2038          Objective:   Filed Vitals:   11/26/13 2124 11/27/13 0210 11/27/13 0615 11/27/13 0715  BP: 160/91 115/62 129/85   Pulse: 92 92 50   Temp: 100.9 F (38.3 C) 99.7 F (37.6 C) 102.3 F (39.1 C) 103.1 F (39.5 C)  TempSrc: Oral  Oral Oral Oral  Resp: 20 18 20    Height:      Weight:      SpO2: 95% 100% 96%     Wt Readings from Last 3 Encounters:  11/23/13 91.173 kg (201 lb)  11/23/13 91.173 kg (201 lb)  11/03/13 96.163 kg (212 lb)     Intake/Output Summary (Last 24 hours) at 11/27/13 0814 Last data filed at 11/27/13 0600  Gross per 24 hour  Intake      0 ml  Output   1135 ml  Net  -1135 ml     Physical Exam  Awake Alert, Oriented X 3, No new F.N deficits, Normal affect [place].AT,PERRAL Supple Neck,No JVD, No cervical lymphadenopathy appriciated.  Symmetrical Chest wall movement, Good air movement bilaterally, CTAB RRR,No Gallops,Rubs or new Murmurs, No Parasternal Heave +ve B.Sounds, Abd  Soft, No tenderness, No organomegaly appriciated, No rebound - guarding or rigidity. No Cyanosis, Clubbing or edema, No new Rash or bruise   Right lower extremity weakness, 2-3 out 5 and proximal part, unable to lift his right foot of the bed, remainder of extremities have 5 out of 5 strength. Musculoskeletal: There is localized warmth and induration involving area of lower left back, painful to palpation, no fluctuant masses noted.   Data Review   Micro Results Recent Results (from the past 240 hour(s))  SURGICAL PCR SCREEN     Status: Abnormal   Collection Time    11/25/13  3:58 AM      Result Value Ref Range Status   MRSA, PCR NEGATIVE  NEGATIVE Final   Staphylococcus aureus POSITIVE (*) NEGATIVE Final   Comment:            The Xpert SA Assay (FDA     approved for NASAL specimens     in patients over 40 years of age),     is one component of     a comprehensive surveillance     program.  Test performance has     been validated by [employer] for patients greater     than or equal to 74 year old.     It is not intended     to diagnose infection nor to     guide or monitor treatment.    Radiology Reports Dg Lumbar Spine 2-3 Views  11/25/2013   CLINICAL DATA:  63 year old male with lumbar  stenosis.  EXAM: LUMBAR SPINE - 2-3 VIEW  COMPARISON:  None.  FINDINGS: Intraoperative fluoroscopic spot images.  Posterior lumbar interbody fusion with bilateral pedicle screw and rod fixation of mid lumbar level, potentially L3-L4. No immediate complicating feature.  IMPRESSION: Intraoperative fluoroscopic spot images of posterior lumbar interbody fusion with bilateral pedicle screw and rod fixation of mid lumbar level. No immediate complicating feature.  Signed,  Dulcy Fanny. Earleen Newport, DO  [facility]  [facility]   Electronically Signed   By: Corrie Mckusick D.O.   On: 11/25/2013 20:21   Dg C-arm 61-120 Min  11/25/2013   CLINICAL DATA:  63 year old male with lumbar surgery  EXAM: DG C-ARM 61-120 MIN  COMPARISON:  None.  FINDINGS: Limited intraoperative spot images performed.  Surgical changes of posterior lumbar interbody fusion with bilateral pedicle screw and rod fixation of mid lumbar level, potentially L3-L4. No immediate complication identified.  IMPRESSION: Limited fluoroscopic spot images performed intraoperatively. Changes of posterior lumbar interbody fusion with bilateral pedicle screw and rod fixation of mid lumbar level without immediate complicating feature.  Signed,  Dulcy Fanny. Earleen Newport, DO  [facility]  [facility]   Electronically Signed   By: Corrie Mckusick D.O.   On: 11/25/2013 20:20    CBC  Recent Labs Lab 11/23/13 1211 11/24/13 0335 11/25/13 0549 11/26/13 0510 11/27/13 0419  WBC 7.7 7.7 10.2 12.3* 11.8*  HGB 7.7* 9.8* 9.7* 9.5* 9.4*  HCT 25.8* 31.7* 30.8* 31.3* 30.8*  PLT 336 338 318 311 292  MCV 76.3* 78.5 75.7* 79.4 78.0  MCH 22.8* 24.3* 23.8* 24.1* 23.8*  MCHC 29.8* 30.9 31.5 30.4 30.5  RDW 16.8* 17.0* 16.8* 17.3* 17.6*  LYMPHSABS 1.6  --   --   --   --   MONOABS 0.9  --   --   --   --   EOSABS 0.1  --   --   --   --  BASOSABS 0.0  --   --   --   --     Chemistries   Recent  Labs Lab 11/23/13 1211 11/25/13 0549 11/26/13 0510 11/27/13 0419  NA 135* 133* 133* 131*  K 4.1 4.3 4.0 4.1  CL 98 97 96 93*  CO2 26 24 25 28   GLUCOSE 222* 286* 196* 163*  BUN 12 17 16 15   CREATININE 0.97 0.89 1.08 1.18  CALCIUM 9.2 9.2 9.3 9.1   ------------------------------------------------------------------------------------------------------------------ estimated creatinine clearance is 74.3 ml/min (by C-G formula based on Cr of 1.18). ------------------------------------------------------------------------------------------------------------------ No results found for this basename: HGBA1C,  in the last 72 hours ------------------------------------------------------------------------------------------------------------------ No results found for this basename: CHOL, HDL, LDLCALC, TRIG, CHOLHDL, LDLDIRECT,  in the last 72 hours ------------------------------------------------------------------------------------------------------------------ No results found for this basename: TSH, T4TOTAL, FREET3, T3FREE, THYROIDAB,  in the last 72 hours ------------------------------------------------------------------------------------------------------------------ No results found for this basename: VITAMINB12, FOLATE, FERRITIN, TIBC, IRON, RETICCTPCT,  in the last 72 hours  Coagulation profile  Recent Labs Lab 11/23/13 1211  INR 1.09    No results found for this basename: DDIMER,  in the last 72 hours  Cardiac Enzymes  Recent Labs Lab 11/23/13 1211  TROPONINI <0.30   ------------------------------------------------------------------------------------------------------------------ No components found with this basename: POCBNP,      Time Spent in minutes   25 minutes   Kelvin Cellar M.D on 11/27/2013 at 8:14 AM  Between 7am to 7pm - Pager - [phone]  After 7pm go to [email or web address] - password [email or web address]  And look for the night coverage person covering for me after  hours  [facility] Office  [phone]   **Disclaimer: This note may have been dictated with voice recognition software. Similar sounding words can inadvertently be transcribed and this note may contain transcription errors which may not have been corrected upon publication of note.**

## 2013-11-27 NOTE — Progress Notes (Signed)
PT Cancellation Note  Patient Details Name: Cory Weber MRN: [id number] DOB: 1950/09/06   Cancelled Treatment:    Reason Eval/Treat Not Completed: Medical issues which prohibited therapy (Elevated temp, pending CT, nursing request to hold in AM.)  Will re-attempt in PM as able.   Zenia Resides, Analina Filla L 11/27/2013, 9:28 AM

## 2013-11-27 NOTE — Discharge Instructions (Signed)
Metformin and X-ray Contrast Studies °For some X-ray exams, a contrast dye is used. Contrast dye is a type of medicine used to make the X-ray image clearer. The contrast dye is given to the patient through a vein (intravenously). If you need to have this type of X-ray exam and you take a medication called metformin, your caregiver may have you stop taking metformin before the exam.  °LACTIC ACIDOSIS °In rare cases, a serious medical condition called lactic acidosis can develop in people who take metformin and receive contrast dye. The following conditions can increase the risk of this complication:  °· Kidney failure. °· Liver problems. °· Certain types of heart problems such as: °¨ Heart failure. °¨ Heart attack. °¨ Heart infection. °¨ Heart valve problems. °· Alcohol abuse. °If left untreated, lactic acidosis can lead to coma.  °SYMPTOMS OF LACTIC ACIDOSIS °Symptoms of lactic acidosis can include: °· Rapid breathing (hyperventilation). °· Neurologic symptoms such as: °¨ Headaches. °¨ Confusion. °¨ Dizziness. °· Excessive sweating. °· Feeling sick to your stomach (nauseous) or throwing up (vomiting). °AFTER THE X-RAY EXAM °· Stay well-hydrated. Drink fluids as instructed by your caregiver. °· If you have a risk of developing lactic acidosis, blood tests may be done to make sure your kidney function is okay. °· Metformin is usually stopped for 48 hours after the X-ray exam. Ask your caregiver when you can start taking metformin again. °SEEK MEDICAL CARE IF:  °· You have shortness of breath or difficulty breathing. °· You develop a headache that does not go away. °· You have nausea or vomiting. °· You urinate more than normal. °· You develop a skin rash and have: °¨ Redness. °¨ Swelling. °¨ Itching. °Document Released: 01/16/2009 Document Revised: 04/22/2011 Document Reviewed: 01/16/2009 °Exit[employer]® Patient Information ©2015 [employer]. This information is not intended to replace advice given to you by your health  care provider. Make sure you discuss any questions you have with your health care provider. ° °

## 2013-11-28 LAB — GLUCOSE, CAPILLARY
GLUCOSE-CAPILLARY: 167 mg/dL — AB (ref 70–99)
Glucose-Capillary: 173 mg/dL — ABNORMAL HIGH (ref 70–99)
Glucose-Capillary: 218 mg/dL — ABNORMAL HIGH (ref 70–99)
Glucose-Capillary: 185 mg/dL — ABNORMAL HIGH (ref 70–99)

## 2013-11-28 LAB — CBC
HEMATOCRIT: 28.7 % — AB (ref 39.0–52.0)
HEMOGLOBIN: 8.6 g/dL — AB (ref 13.0–17.0)
MCH: 23.8 pg — ABNORMAL LOW (ref 26.0–34.0)
MCHC: 30 g/dL (ref 30.0–36.0)
MCV: 79.3 fL (ref 78.0–100.0)
Platelets: 235 10*3/uL (ref 150–400)
RBC: 3.62 MIL/uL — AB (ref 4.22–5.81)
RDW: 17.3 % — ABNORMAL HIGH (ref 11.5–15.5)
WBC: 10.5 10*3/uL (ref 4.0–10.5)

## 2013-11-28 LAB — BASIC METABOLIC PANEL
Anion gap: 10 (ref 5–15)
BUN: 15 mg/dL (ref 6–23)
CHLORIDE: 93 meq/L — AB (ref 96–112)
CO2: 28 meq/L (ref 19–32)
Calcium: 9 mg/dL (ref 8.4–10.5)
Creatinine, Ser: 1.07 mg/dL (ref 0.50–1.35)
GFR calc Af Amer: 85 mL/min — ABNORMAL LOW (ref >90)
GFR, EST NON AFRICAN AMERICAN: 73 mL/min — AB (ref >90)
GLUCOSE: 171 mg/dL — AB (ref 70–99)
POTASSIUM: 4.1 meq/L (ref 3.7–5.3)
SODIUM: 131 meq/L — AB (ref 137–147)

## 2013-11-28 MED ORDER — POLYETHYLENE GLYCOL 3350 17 G PO PACK
17.0000 g | PACK | Freq: Every day | ORAL | Status: DC
  Administered 2013-11-28 – 2013-11-29 (×2): 17 g via ORAL
  Filled 2013-11-28 (×2): qty 1

## 2013-11-28 MED ORDER — DOCUSATE SODIUM 100 MG PO CAPS
100.0000 mg | ORAL_CAPSULE | Freq: Two times a day (BID) | ORAL | Status: DC
  Administered 2013-11-28 – 2013-11-29 (×3): 100 mg via ORAL
  Filled 2013-11-28 (×3): qty 1

## 2013-11-28 MED ORDER — BISACODYL 10 MG RE SUPP
10.0000 mg | Freq: Once | RECTAL | Status: AC
  Administered 2013-11-28: 10 mg via RECTAL
  Filled 2013-11-28: qty 1

## 2013-11-28 MED ORDER — LACTULOSE 10 GM/15ML PO SOLN
20.0000 g | Freq: Two times a day (BID) | ORAL | Status: DC | PRN
  Administered 2013-11-28: 20 g via ORAL
  Filled 2013-11-28: qty 30

## 2013-11-28 NOTE — Progress Notes (Signed)
Overall doing well today. Pain well controlled. Patient states he is having some improved function in his right thigh. No other complaints or problems.  Currently afebrile. Brief temperature spike yesterday morning. Vital stable. Awake and alert. Oriented and appropriate. Motor and sensory function stable. Right quadriceps strength remains 1-2/5.  Progressing reasonably well. Discharge plan pending. No new recommendations or changes.

## 2013-11-28 NOTE — Progress Notes (Signed)
Physical Therapy Treatment Patient Details Name: Cory Weber MRN: [id number] DOB: 06-Aug-1950 Today's Date: 11/28/2013    History of Present Illness Patient is a 63 y/o male admitted with three-day history of right leg pain with right leg weakness. He has a history of spondylolisthesis at L3-4 and had a previous disc rupture with a superior free fragment at L3-4 on the right 2 years ago.  Now s/p decompressive lumbar laminectomy L2-3 with PLIF. Pt also with metatarsal fx on RLE from fall in last 3 days.    PT Comments    Making slow progress towards goals, but noted RLE more stable in stance;  I feel CIR is worth a closer look -- Noted pt's insurance may not cover Therapies at SNF level of care, although post-acute therapies are indicated to maximize independence and safety with mobility   Follow Up Recommendations  CIR     Equipment Recommendations  Rolling walker with 5" wheels;3in1 (PT)    Recommendations for Other Services       Precautions / Restrictions Precautions Precautions: Fall;Back Precaution Comments: reviewed all back precautions; no bending/twisting/arching    Mobility  Bed Mobility Overal bed mobility: Needs Assistance Bed Mobility: Rolling;Sidelying to Sit Rolling: Min guard Sidelying to sit: Min assist;HOB elevated       General bed mobility comments: cues for technique, assisted right leg off bed, cues to use rail  Transfers Overall transfer level: Needs assistance Equipment used: Rolling walker (2 wheeled) Transfers: Sit to/from Stand Sit to Stand: Mod assist         General transfer comment: cues for UE's on bed, and to relay on left leg, increased time to transition hands to walker  Ambulation/Gait Ambulation/Gait assistance: Min assist Ambulation Distance (Feet): 20 Feet Assistive device: Rolling walker (2 wheeled) Gait Pattern/deviations: Step-through pattern     General Gait Details: cues for sequence and to rely on UE's for  stability due to right LE weakness, noted some difficulty with right LE placement with decreased proprioception   Stairs            Wheelchair Mobility    Modified Rankin (Stroke Patients Only)       Balance     Sitting balance-Leahy Scale: Good       Standing balance-Leahy Scale: Poor                      Cognition Arousal/Alertness: Awake/alert Behavior During Therapy: WFL for tasks assessed/performed Overall Cognitive Status: Within Functional Limits for tasks assessed                      Exercises      General Comments        Pertinent Vitals/Pain Pain Assessment: 0-10 Pain Score: 7  Pain Location: Back and abdomen (Pt very much hoping to move his bowels) Pain Descriptors / Indicators: Discomfort Pain Intervention(s): Monitored during session;Repositioned    Home Living                      Prior Function            PT Goals (current goals can now be found in the care plan section) Acute Rehab PT Goals Patient Stated Goal: To be independent at home again. PT Goal Formulation: With patient Time For Goal Achievement: 12/10/13 Potential to Achieve Goals: Good Progress towards PT goals: Progressing toward goals (very slowly)    Frequency  Min 5X/week  PT Plan Discharge plan needs to be updated    Co-evaluation             End of Session Equipment Utilized During Treatment: Gait belt Activity Tolerance: Patient tolerated treatment well;Patient limited by pain (abdominal discomfort) Patient left: in chair;with call bell/phone within reach;with chair alarm set     Time: [id number] PT Time Calculation (min): 27 min  Charges:  $Gait Training: 8-22 mins $Therapeutic Activity: 8-22 mins                    G Codes:      Quin Hoop 11/28/2013, 3:50 PM  Roney Marion, [place] Pager [phone] Office [phone]

## 2013-11-28 NOTE — Progress Notes (Signed)
Patient Demographics  Cory Weber, is a 63 y.o. male, DOB - 01-Jun-1950, [id number]  Admit date - 11/23/2013   Admitting Physician Kathie Dike, MD  Outpatient Primary MD for the patient is Maggie Font, MD  LOS - 5   Chief Complaint  Patient presents with  . extremity weakness       Brief narrative: Patient with known history of CVA, diabetes mellitus, hypertension, no residual deficits, presents with significant right lower extremity weakness, MRI of lumbar spine showing significant mass effect on the right L4 nerve root, no acute or subacute CVA on MRI brain, patient is being transferred to [facility] cone 4 ports under Dr. Ronnald Ramp care. Medicine will continue to follow as a consult.  On 11/27/2013 patient developing fever of 103.1  Subjective:   Cory Weber today reporting feeling good, thinks he is moving his right lower extremity better today  Assessment & Plan    Active Problems:   DM (diabetes mellitus), type 2, uncontrolled   Accelerated hypertension   Tobacco abuse   Normocytic anemia   Right leg weakness   Lumbar spinal stenosis   S/P lumbar spinal fusion   Acute right lumbar radiculopathy   Fever presenting with conditions classified elsewhere  Fever -On 11/27/2013 patient developing fever, having TEMP of 103.1 -Had decompressive lumbar laminectomy on 11/25/2013, back examined, there is area of warmth, induration and pain in the left lower back region.  -CT scan of ABD/Pelvis performed on 11/27/2013 showed no acute finding without source of fever being identified. There is no evidence of complication identified from L4-5 fusion -Chest x-ray and urinalysis was unremarkable -Blood cultures remain in progress. Unclear source of infection. -Started emperic IV antimicrobial therapy with Vancomycin and Zosyn. Would recommend antimicrobial  therapy until blood cultures are resulted    right lower extremity weakness -Due to severe lumbar radiculopathy, with compression mass effect on the L4 right nerve root. MRI showing mass at L4 level with compression of nerve root.  -There is no acute finding of MRI brain to suggest acute or subacute stroke, but patient is having significant atherosclerotic disease in multiple vessels in the MRA of the brain, will resume aspirin when patient is more stable. -Patient undergoing decompressive lumbar laminectomy on 11/25/2013, procedure performed by Dr. Ronnald Ramp of neurosurgery  Anemia -Patient is known to have history of iron deficiency anemia, with extensive workup in the past including colonoscopy and endoscopy in 2014, showing gastric ulcer and erosions. -Patient is Hemoccult negative x2 in ED, no evidence of active bleed, melena, or hematemesis. -Patient was transfused 2 units packed red blood cell 11/23/2013  Diabetes mellitus -Seems to be controlled while inpatient, continue with insulin sliding scale.  Hypertension -Continue Amlodipine 5 mg PO q daily and Lisinopril 20 mg PO BID  Fifth metatarsal fracture -Patient having a fall several days prior to presentation, imaging showing nondisplaced fracture of proximal fifth metatarsal, in anatomic alignment, no dislocation -Ortho consult when stable  Code Status: Full  Family Communication:   Disposition Plan: Physical therapy recommending SNF placement   Procedures  Decompressive lumbar laminectomy on 11/25/2013   Medications  Scheduled Meds: . amLODipine  5 mg Oral Daily  . gabapentin  300 mg Oral BID  . lisinopril  20 mg  Oral BID   And  . hydrochlorothiazide  12.5 mg Oral BID  . insulin aspart  0-15 Units Subcutaneous TID WC  . insulin aspart  0-5 Units Subcutaneous QHS  . insulin glargine  12 Units Subcutaneous QHS  . Linaclotide  290 mcg Oral Daily  . pantoprazole  40 mg Oral BID  . piperacillin-tazobactam (ZOSYN)  IV   3.375 g Intravenous 3 times per day  . sodium chloride  3 mL Intravenous Q12H  . vancomycin  1,250 mg Intravenous Q12H   Continuous Infusions: . sodium chloride    . 0.9 % NaCl with KCl 20 mEq / L 75 mL/hr at 11/27/13 0813   PRN Meds:.acetaminophen, acetaminophen, menthol-cetylpyridinium, methocarbamol (ROBAXIN) IV, methocarbamol, morphine injection, ondansetron (ZOFRAN) IV, oxyCODONE-acetaminophen, phenol, polyethylene glycol, senna-docusate, sodium chloride  DVT Prophylaxis  Heparin - SCDs   Lab Results  Component Value Date   PLT 235 11/28/2013    Antibiotics    Anti-infectives   Start     Dose/Rate Route Frequency Ordered Stop   11/28/13 0400  vancomycin (VANCOCIN) 1,250 mg in sodium chloride 0.9 % 250 mL IVPB     1,250 mg 166.7 mL/hr over 90 Minutes Intravenous Every 12 hours 11/27/13 1654     11/27/13 1400  vancomycin (VANCOCIN) IVPB 1000 mg/200 mL premix  Status:  Discontinued     1,000 mg 200 mL/hr over 60 Minutes Intravenous Every 8 hours 11/27/13 0746 11/27/13 1654   11/27/13 0800  piperacillin-tazobactam (ZOSYN) IVPB 3.375 g     3.375 g 12.5 mL/hr over 240 Minutes Intravenous 3 times per day 11/27/13 0732     11/27/13 0800  vancomycin (VANCOCIN) 1,500 mg in sodium chloride 0.9 % 500 mL IVPB     1,500 mg 250 mL/hr over 120 Minutes Intravenous  Once 11/27/13 0746 11/27/13 1304   11/27/13 0730  piperacillin-tazobactam (ZOSYN) IVPB 3.375 g  Status:  Discontinued     3.375 g 12.5 mL/hr over 240 Minutes Intravenous 4 times per day 11/27/13 0723 11/27/13 0732   11/26/13 0500  vancomycin (VANCOCIN) IVPB 1000 mg/200 mL premix     1,000 mg 200 mL/hr over 60 Minutes Intravenous  Once 11/25/13 2051 11/26/13 0549   11/25/13 1805  bacitracin 50,000 Units in sodium chloride irrigation 0.9 % 500 mL irrigation  Status:  Discontinued       As needed 11/25/13 1805 11/25/13 1937   11/25/13 1730  vancomycin (VANCOCIN) IVPB 1000 mg/200 mL premix  Status:  Discontinued     1,000 mg 200  mL/hr over 60 Minutes Intravenous To Neuro OR-Station #32 11/25/13 1727 11/25/13 2038          Objective:   Filed Vitals:   11/28/13 0000 11/28/13 0100 11/28/13 0400 11/28/13 1013  BP:  159/76  137/77  Pulse:  90  120  Temp:  98.8 F (37.1 C)  97.9 F (36.6 C)  TempSrc:  Oral  Oral  Resp: 18 20 17 18   Height:      Weight:      SpO2:  98%  100%    Wt Readings from Last 3 Encounters:  11/23/13 91.173 kg (201 lb)  11/23/13 91.173 kg (201 lb)  11/03/13 96.163 kg (212 lb)     Intake/Output Summary (Last 24 hours) at 11/28/13 1149 Last data filed at 11/28/13 0913  Gross per 24 hour  Intake    960 ml  Output    900 ml  Net     60 ml  Physical Exam  Awake Alert, Oriented X 3, No new F.N deficits, Normal affect [place].AT,PERRAL Supple Neck,No JVD, No cervical lymphadenopathy appriciated.  Symmetrical Chest wall movement, Good air movement bilaterally, CTAB RRR,No Gallops,Rubs or new Murmurs, No Parasternal Heave +ve B.Sounds, Abd Soft, No tenderness, No organomegaly appriciated, No rebound - guarding or rigidity. No Cyanosis, Clubbing or edema, No new Rash or bruise   Right lower extremity weakness, 3 out 5, improved from yesterday's exam Musculoskeletal: Surgical incision sites inspected, no evidence of acute infection. No fluctuant mass, erythema or significant pain noted on this mornings exam  Data Review   Micro Results Recent Results (from the past 240 hour(s))  SURGICAL PCR SCREEN     Status: Abnormal   Collection Time    11/25/13  3:58 AM      Result Value Ref Range Status   MRSA, PCR NEGATIVE  NEGATIVE Final   Staphylococcus aureus POSITIVE (*) NEGATIVE Final   Comment:            The Xpert SA Assay (FDA     approved for NASAL specimens     in patients over 34 years of age),     is one component of     a comprehensive surveillance     program.  Test performance has     been validated by [employer] for patients greater     than or equal to 71  year old.     It is not intended     to diagnose infection nor to     guide or monitor treatment.    Radiology Reports Ct Abdomen Pelvis W Contrast  11/27/2013   CLINICAL DATA:  Status post L3-4 laminectomy and fusion 11/25/2013. Fever.  EXAM: CT ABDOMEN AND PELVIS WITH CONTRAST  TECHNIQUE: Multidetector CT imaging of the abdomen and pelvis was performed using the standard protocol following bolus administration of intravenous contrast.  CONTRAST:  100 mL OMNIPAQUE IOHEXOL 300 MG/ML  SOLN  COMPARISON:  None.  FINDINGS: There is no pleural or pericardial effusion. Heart size is normal. Lung bases are clear.  The gallbladder, liver, spleen, right adrenal gland, pancreas and kidneys appear normal. Calcification involving the superior aspect of the right adrenal gland may be due to old hemorrhage or infection. Aortoiliac atherosclerosis without aneurysm is identified. There is no intra-abdominal or pelvic fluid collection. The stomach, small and large bowel and appendix appear normal.  Postoperative change of L3-4 laminectomy and fusion is identified. Hardware is intact and appears appropriately positioned. There is a small amount of air and fluid in the surgical site and in the subcutaneous soft tissues of the back. Likely Gel-Foam in the laminectomy bed is also identified. No lytic or sclerotic bony lesion is seen.  IMPRESSION: No acute finding abdomen or pelvis. No source for fever is identified.  Status post L4-5 fusion. No evidence of complication is identified by abdomen and pelvis CT scan.   Electronically Signed   By: Inge Rise M.D.   On: 11/27/2013 14:19   Dg Chest Port 1 View  11/27/2013   CLINICAL DATA:  Fever.  EXAM: PORTABLE CHEST - 1 VIEW  COMPARISON:  Single view of the chest 11/23/2013. PA and lateral chest 02/03/2011.  FINDINGS: Heart size and mediastinal contours are within normal limits. Both lungs are clear. Visualized skeletal structures are unremarkable.  IMPRESSION: Negative  exam.   Electronically Signed   By: Inge Rise M.D.   On: 11/27/2013 11:57  CBC  Recent Labs Lab 11/23/13 1211 11/24/13 0335 11/25/13 0549 11/26/13 0510 11/27/13 0419 11/28/13 0645  WBC 7.7 7.7 10.2 12.3* 11.8* 10.5  HGB 7.7* 9.8* 9.7* 9.5* 9.4* 8.6*  HCT 25.8* 31.7* 30.8* 31.3* 30.8* 28.7*  PLT 336 338 318 311 292 235  MCV 76.3* 78.5 75.7* 79.4 78.0 79.3  MCH 22.8* 24.3* 23.8* 24.1* 23.8* 23.8*  MCHC 29.8* 30.9 31.5 30.4 30.5 30.0  RDW 16.8* 17.0* 16.8* 17.3* 17.6* 17.3*  LYMPHSABS 1.6  --   --   --   --   --   MONOABS 0.9  --   --   --   --   --   EOSABS 0.1  --   --   --   --   --   BASOSABS 0.0  --   --   --   --   --     Chemistries   Recent Labs Lab 11/23/13 1211 11/25/13 0549 11/26/13 0510 11/27/13 0419 11/28/13 0645  NA 135* 133* 133* 131* 131*  K 4.1 4.3 4.0 4.1 4.1  CL 98 97 96 93* 93*  CO2 26 24 25 28 28   GLUCOSE 222* 286* 196* 163* 171*  BUN 12 17 16 15 15   CREATININE 0.97 0.89 1.08 1.18 1.07  CALCIUM 9.2 9.2 9.3 9.1 9.0   ------------------------------------------------------------------------------------------------------------------ estimated creatinine clearance is 81.9 ml/min (by C-G formula based on Cr of 1.07). ------------------------------------------------------------------------------------------------------------------ No results found for this basename: HGBA1C,  in the last 72 hours ------------------------------------------------------------------------------------------------------------------ No results found for this basename: CHOL, HDL, LDLCALC, TRIG, CHOLHDL, LDLDIRECT,  in the last 72 hours ------------------------------------------------------------------------------------------------------------------ No results found for this basename: TSH, T4TOTAL, FREET3, T3FREE, THYROIDAB,  in the last 72 hours ------------------------------------------------------------------------------------------------------------------ No results  found for this basename: VITAMINB12, FOLATE, FERRITIN, TIBC, IRON, RETICCTPCT,  in the last 72 hours  Coagulation profile  Recent Labs Lab 11/23/13 1211  INR 1.09    No results found for this basename: DDIMER,  in the last 72 hours  Cardiac Enzymes  Recent Labs Lab 11/23/13 1211  TROPONINI <0.30   ------------------------------------------------------------------------------------------------------------------ No components found with this basename: POCBNP,      Time Spent in minutes   25 minutes   Kelvin Cellar M.D on 11/28/2013 at 11:49 AM  Between 7am to 7pm - Pager - [phone]  After 7pm go to [email or web address] - password [email or web address]  And look for the night coverage person covering for me after hours  [facility] Office  [phone]   **Disclaimer: This note may have been dictated with voice recognition software. Similar sounding words can inadvertently be transcribed and this note may contain transcription errors which may not have been corrected upon publication of note.**

## 2013-11-29 ENCOUNTER — Inpatient Hospital Stay (HOSPITAL_COMMUNITY)
Admission: RE | Admit: 2013-11-29 | Discharge: 2013-12-04 | DRG: 948 | Disposition: A | Discharge status: Home Health | Payer: Medicaid Other | Source: Intra-hospital | Attending: Physical Medicine & Rehabilitation | Admitting: Physical Medicine & Rehabilitation

## 2013-11-29 DIAGNOSIS — T502X5A Adverse effect of carbonic-anhydrase inhibitors, benzothiadiazides and other diuretics, initial encounter: Secondary | ICD-10-CM | POA: Diagnosis present

## 2013-11-29 DIAGNOSIS — E785 Hyperlipidemia, unspecified: Secondary | ICD-10-CM | POA: Diagnosis present

## 2013-11-29 DIAGNOSIS — R531 Weakness: Secondary | ICD-10-CM | POA: Diagnosis present

## 2013-11-29 DIAGNOSIS — M5416 Radiculopathy, lumbar region: Secondary | ICD-10-CM

## 2013-11-29 DIAGNOSIS — E871 Hypo-osmolality and hyponatremia: Secondary | ICD-10-CM | POA: Diagnosis present

## 2013-11-29 DIAGNOSIS — IMO0002 Reserved for concepts with insufficient information to code with codable children: Secondary | ICD-10-CM

## 2013-11-29 DIAGNOSIS — M4316 Spondylolisthesis, lumbar region: Secondary | ICD-10-CM | POA: Diagnosis present

## 2013-11-29 DIAGNOSIS — E1165 Type 2 diabetes mellitus with hyperglycemia: Secondary | ICD-10-CM

## 2013-11-29 DIAGNOSIS — Z981 Arthrodesis status: Secondary | ICD-10-CM | POA: Diagnosis not present

## 2013-11-29 DIAGNOSIS — R29898 Other symptoms and signs involving the musculoskeletal system: Secondary | ICD-10-CM | POA: Diagnosis present

## 2013-11-29 DIAGNOSIS — B1920 Unspecified viral hepatitis C without hepatic coma: Secondary | ICD-10-CM | POA: Diagnosis present

## 2013-11-29 DIAGNOSIS — I69351 Hemiplegia and hemiparesis following cerebral infarction affecting right dominant side: Secondary | ICD-10-CM

## 2013-11-29 DIAGNOSIS — K5900 Constipation, unspecified: Secondary | ICD-10-CM | POA: Diagnosis present

## 2013-11-29 DIAGNOSIS — F17210 Nicotine dependence, cigarettes, uncomplicated: Secondary | ICD-10-CM | POA: Diagnosis present

## 2013-11-29 DIAGNOSIS — E1142 Type 2 diabetes mellitus with diabetic polyneuropathy: Secondary | ICD-10-CM | POA: Diagnosis present

## 2013-11-29 DIAGNOSIS — F1910 Other psychoactive substance abuse, uncomplicated: Secondary | ICD-10-CM

## 2013-11-29 DIAGNOSIS — M48061 Spinal stenosis, lumbar region without neurogenic claudication: Secondary | ICD-10-CM

## 2013-11-29 DIAGNOSIS — M4806 Spinal stenosis, lumbar region: Secondary | ICD-10-CM | POA: Diagnosis present

## 2013-11-29 DIAGNOSIS — I739 Peripheral vascular disease, unspecified: Secondary | ICD-10-CM

## 2013-11-29 DIAGNOSIS — D649 Anemia, unspecified: Secondary | ICD-10-CM

## 2013-11-29 DIAGNOSIS — I10 Essential (primary) hypertension: Secondary | ICD-10-CM | POA: Diagnosis present

## 2013-11-29 DIAGNOSIS — R5081 Fever presenting with conditions classified elsewhere: Secondary | ICD-10-CM

## 2013-11-29 DIAGNOSIS — D509 Iron deficiency anemia, unspecified: Secondary | ICD-10-CM | POA: Diagnosis present

## 2013-11-29 DIAGNOSIS — D62 Acute posthemorrhagic anemia: Secondary | ICD-10-CM

## 2013-11-29 DIAGNOSIS — F1010 Alcohol abuse, uncomplicated: Secondary | ICD-10-CM | POA: Diagnosis present

## 2013-11-29 LAB — BASIC METABOLIC PANEL
ANION GAP: 10 (ref 5–15)
BUN: 11 mg/dL (ref 6–23)
CHLORIDE: 92 meq/L — AB (ref 96–112)
CO2: 28 mEq/L (ref 19–32)
Calcium: 9 mg/dL (ref 8.4–10.5)
Creatinine, Ser: 0.94 mg/dL (ref 0.50–1.35)
GFR calc non Af Amer: 88 mL/min — ABNORMAL LOW (ref >90)
Glucose, Bld: 176 mg/dL — ABNORMAL HIGH (ref 70–99)
Potassium: 3.8 mEq/L (ref 3.7–5.3)
SODIUM: 130 meq/L — AB (ref 137–147)

## 2013-11-29 LAB — URINALYSIS, ROUTINE W REFLEX MICROSCOPIC
Bilirubin Urine: NEGATIVE
Glucose, UA: NEGATIVE mg/dL
HGB URINE DIPSTICK: NEGATIVE
Ketones, ur: NEGATIVE mg/dL
Leukocytes, UA: NEGATIVE
NITRITE: NEGATIVE
PH: 6.5 (ref 5.0–8.0)
Protein, ur: NEGATIVE mg/dL
Specific Gravity, Urine: 1.009 (ref 1.005–1.030)
Urobilinogen, UA: 1 mg/dL (ref 0.0–1.0)

## 2013-11-29 LAB — CBC
HCT: 29 % — ABNORMAL LOW (ref 39.0–52.0)
Hemoglobin: 8.9 g/dL — ABNORMAL LOW (ref 13.0–17.0)
MCH: 23.7 pg — ABNORMAL LOW (ref 26.0–34.0)
MCHC: 30.7 g/dL (ref 30.0–36.0)
MCV: 77.3 fL — AB (ref 78.0–100.0)
PLATELETS: 288 10*3/uL (ref 150–400)
RBC: 3.75 MIL/uL — ABNORMAL LOW (ref 4.22–5.81)
RDW: 16.9 % — ABNORMAL HIGH (ref 11.5–15.5)
WBC: 7.4 10*3/uL (ref 4.0–10.5)

## 2013-11-29 LAB — GLUCOSE, CAPILLARY
GLUCOSE-CAPILLARY: 156 mg/dL — AB (ref 70–99)
GLUCOSE-CAPILLARY: 86 mg/dL (ref 70–99)
Glucose-Capillary: 174 mg/dL — ABNORMAL HIGH (ref 70–99)
Glucose-Capillary: 179 mg/dL — ABNORMAL HIGH (ref 70–99)

## 2013-11-29 MED ORDER — GUAIFENESIN-DM 100-10 MG/5ML PO SYRP: 5.0000 mL | ORAL_SOLUTION | Freq: Four times a day (QID) | ORAL | Status: DC | PRN

## 2013-11-29 MED ORDER — TRAMADOL HCL 50 MG PO TABS
50.0000 mg | ORAL_TABLET | Freq: Four times a day (QID) | ORAL | Status: DC | PRN
Start: 2013-11-29 — End: 2013-12-04
  Administered 2013-11-30 – 2013-12-01 (×2): 50 mg via ORAL
  Filled 2013-11-29 (×2): qty 1

## 2013-11-29 MED ORDER — LINACLOTIDE 290 MCG PO CAPS
290.0000 ug | ORAL_CAPSULE | Freq: Every day | ORAL | Status: DC
  Administered 2013-11-30 – 2013-12-04 (×5): 290 ug via ORAL
  Filled 2013-11-29 (×6): qty 1

## 2013-11-29 MED ORDER — PHENOL 1.4 % MT LIQD: 1.0000 | OROMUCOSAL | Status: DC | PRN

## 2013-11-29 MED ORDER — AMLODIPINE BESYLATE 5 MG PO TABS
5.0000 mg | ORAL_TABLET | Freq: Every day | ORAL | Status: DC
  Administered 2013-11-30 – 2013-12-04 (×5): 5 mg via ORAL
  Filled 2013-11-29 (×6): qty 1

## 2013-11-29 MED ORDER — NICOTINE 14 MG/24HR TD PT24
14.0000 mg | MEDICATED_PATCH | TRANSDERMAL | Status: DC
  Administered 2013-11-29 – 2013-12-03 (×4): 14 mg via TRANSDERMAL
  Filled 2013-11-29 (×6): qty 1

## 2013-11-29 MED ORDER — METFORMIN HCL 500 MG PO TABS
1000.0000 mg | ORAL_TABLET | Freq: Two times a day (BID) | ORAL | Status: DC
  Administered 2013-11-29 – 2013-12-04 (×10): 1000 mg via ORAL
  Filled 2013-11-29 (×12): qty 2

## 2013-11-29 MED ORDER — ONDANSETRON HCL 4 MG PO TABS: 4.0000 mg | ORAL_TABLET | Freq: Four times a day (QID) | ORAL | Status: DC | PRN

## 2013-11-29 MED ORDER — HYDROCERIN EX CREA
TOPICAL_CREAM | Freq: Every day | CUTANEOUS | Status: DC | PRN
  Filled 2013-11-29: qty 113

## 2013-11-29 MED ORDER — LISINOPRIL 20 MG PO TABS
20.0000 mg | ORAL_TABLET | Freq: Two times a day (BID) | ORAL | Status: DC
  Administered 2013-11-29 – 2013-12-04 (×10): 20 mg via ORAL
  Filled 2013-11-29 (×12): qty 1

## 2013-11-29 MED ORDER — OXYCODONE HCL 5 MG PO TABS
5.0000 mg | ORAL_TABLET | ORAL | Status: DC | PRN
  Administered 2013-11-29 – 2013-12-04 (×21): 10 mg via ORAL
  Filled 2013-11-29 (×21): qty 2

## 2013-11-29 MED ORDER — TRAZODONE HCL 50 MG PO TABS
25.0000 mg | ORAL_TABLET | Freq: Every evening | ORAL | Status: DC | PRN
Start: 2013-11-29 — End: 2013-12-04

## 2013-11-29 MED ORDER — BISACODYL 10 MG RE SUPP
10.0000 mg | Freq: Every day | RECTAL | Status: DC | PRN
  Administered 2013-12-02: 10 mg via RECTAL
  Filled 2013-11-29: qty 1

## 2013-11-29 MED ORDER — ENOXAPARIN SODIUM 40 MG/0.4ML ~~LOC~~ SOLN
40.0000 mg | SUBCUTANEOUS | Status: DC
  Administered 2013-11-29: 40 mg via SUBCUTANEOUS
  Filled 2013-11-29 (×2): qty 0.4

## 2013-11-29 MED ORDER — INSULIN ASPART 100 UNIT/ML ~~LOC~~ SOLN: 0.0000 [IU] | Freq: Every day | SUBCUTANEOUS | Status: DC

## 2013-11-29 MED ORDER — INSULIN ASPART 100 UNIT/ML ~~LOC~~ SOLN
0.0000 [IU] | Freq: Three times a day (TID) | SUBCUTANEOUS | Status: DC
  Administered 2013-11-29: 3 [IU] via SUBCUTANEOUS
  Administered 2013-12-01 – 2013-12-03 (×6): 2 [IU] via SUBCUTANEOUS
  Administered 2013-12-03: 3 [IU] via SUBCUTANEOUS
  Administered 2013-12-03 – 2013-12-04 (×2): 2 [IU] via SUBCUTANEOUS
  Administered 2013-12-04: 3 [IU] via SUBCUTANEOUS

## 2013-11-29 MED ORDER — DIPHENHYDRAMINE HCL 12.5 MG/5ML PO ELIX: 12.5000 mg | ORAL_SOLUTION | Freq: Four times a day (QID) | ORAL | Status: DC | PRN

## 2013-11-29 MED ORDER — ALUM & MAG HYDROXIDE-SIMETH 200-200-20 MG/5ML PO SUSP: 30.0000 mL | ORAL | Status: DC | PRN

## 2013-11-29 MED ORDER — MENTHOL 3 MG MT LOZG: 1.0000 | LOZENGE | OROMUCOSAL | Status: DC | PRN

## 2013-11-29 MED ORDER — FERROUS FUMARATE 325 (106 FE) MG PO TABS
1.0000 | ORAL_TABLET | Freq: Two times a day (BID) | ORAL | Status: DC
  Administered 2013-11-29 – 2013-12-04 (×10): 106 mg via ORAL
  Filled 2013-11-29 (×12): qty 1

## 2013-11-29 MED ORDER — LACTULOSE 10 GM/15ML PO SOLN
20.0000 g | Freq: Two times a day (BID) | ORAL | Status: DC
  Administered 2013-11-29 – 2013-12-04 (×10): 20 g via ORAL
  Filled 2013-11-29 (×12): qty 30

## 2013-11-29 MED ORDER — ACETAMINOPHEN 325 MG PO TABS: 325.0000 mg | ORAL_TABLET | ORAL | Status: DC | PRN

## 2013-11-29 MED ORDER — HYDROCHLOROTHIAZIDE 12.5 MG PO CAPS
12.5000 mg | ORAL_CAPSULE | Freq: Two times a day (BID) | ORAL | Status: DC
Start: 2013-11-29 — End: 2013-12-04
  Administered 2013-11-29 – 2013-12-04 (×10): 12.5 mg via ORAL
  Filled 2013-11-29 (×12): qty 1

## 2013-11-29 MED ORDER — FLEET ENEMA 7-19 GM/118ML RE ENEM: 1.0000 | ENEMA | Freq: Once | RECTAL | Status: AC | PRN

## 2013-11-29 MED ORDER — GLIPIZIDE 5 MG PO TABS
5.0000 mg | ORAL_TABLET | Freq: Two times a day (BID) | ORAL | Status: DC
  Administered 2013-11-29 – 2013-11-30 (×2): 5 mg via ORAL
  Filled 2013-11-29 (×4): qty 1

## 2013-11-29 MED ORDER — METHOCARBAMOL 500 MG PO TABS
500.0000 mg | ORAL_TABLET | Freq: Four times a day (QID) | ORAL | Status: DC | PRN
  Administered 2013-12-01 – 2013-12-04 (×2): 500 mg via ORAL
  Filled 2013-11-29 (×2): qty 1

## 2013-11-29 MED ORDER — GLIPIZIDE 5 MG PO TABS: 2.5000 mg | ORAL_TABLET | Freq: Two times a day (BID) | ORAL | Status: DC

## 2013-11-29 MED ORDER — PANTOPRAZOLE SODIUM 40 MG PO TBEC
40.0000 mg | DELAYED_RELEASE_TABLET | Freq: Two times a day (BID) | ORAL | Status: DC
  Administered 2013-11-29 – 2013-12-04 (×10): 40 mg via ORAL
  Filled 2013-11-29 (×14): qty 1

## 2013-11-29 MED ORDER — GABAPENTIN 300 MG PO CAPS
300.0000 mg | ORAL_CAPSULE | Freq: Two times a day (BID) | ORAL | Status: DC
  Administered 2013-11-29 – 2013-12-04 (×10): 300 mg via ORAL
  Filled 2013-11-29 (×12): qty 1

## 2013-11-29 MED ORDER — ONDANSETRON HCL 4 MG/2ML IJ SOLN: 4.0000 mg | Freq: Four times a day (QID) | INTRAMUSCULAR | Status: DC | PRN

## 2013-11-29 NOTE — H&P (Signed)
Physical Medicine and Rehabilitation Admission H&P  Chief Complaint   Patient presents with   .  RLE weakness with difficulty walking.   HPI: Cory Weber is a 63 y.o. male with history of DM type 2 with peripheral neuropathy, Hep C, CVA with residual RLE weakness, spondylolisthesis at L3/4 with a previous disc rupture with a superior free fragment at L3-4 on the right 2 years ago. He was admitted via [facility] on 11/24/13 with three day history of falls with worsening of RLE weakness and difficulty walking. Right foot X rays with nondisplaced fracture of proximal 5 th MT without dislocation. He was noted to be anemic with Hgb 7.7 and was transfused with 2 units PRBC for acute on chronic anemia. MRI spine done revealing spondylolisthesis with L3-4 spinal stenosis and an inferior free fragment compressing the right L4 nerve root and surgical decompression recommended by Dr. Ronnald Ramp. He was taken to OR for decompressive lam L3-L4 with PLIF on 11/25/13.  Post op continues to have RLE weakness with numbness as well as with problems with mobility. He has had fevers in evenings and CT abd/pelvis without acute findings. CXR negative. Blood culture X 2 10/17--negative so far. He was started on IV Vanc/zosyn empirically and antibiotics d/c today. CIR recommended by MD and therapy team and patient admitted today.    Review of Systems  HENT: Negative for hearing loss.  Eyes: Negative for blurred vision and double vision.  Respiratory: Negative for cough and shortness of breath.  Cardiovascular: Negative for chest pain and palpitations.  Gastrointestinal: Positive for constipation. Negative for heartburn and nausea.  Genitourinary: Negative for dysuria and urgency.  Musculoskeletal: Positive for back pain and myalgias.  Neurological: Positive for tingling, sensory change and focal weakness. Negative for dizziness and headaches.  Psychiatric/Behavioral: The patient is not nervous/anxious and does not have  insomnia.   Past Medical History   Diagnosis  Date   .  Diabetes mellitus    .  Hypertension    .  Hepatitis C      states he was diagnosed in 2007 or 2007 while living in [place], [place]   .  CVA (cerebral infarction)  01/2011   .  Hyperlipidemia    .  Asthma    .  PVD (peripheral vascular disease)    .  Anemia    .  Stroke    .  OHKGOVPC(340.3)     Past Surgical History   Procedure  Laterality  Date   .  Liver biopsy   2005     Done in [place], [place]. Chronic hepatitis with mild periportal inflammation, lobular unicellular necrosis and portal fibrosis. Grade 2, stage 1-2.   Marland Kitchen  Colonoscopy with propofol  N/A  09/03/2012     TCY:ELYHTMB polyp-removed as outlined above. Prominent internal hemorrhoids. Tubular adenoma   .  Esophagogastroduodenoscopy (egd) with propofol  N/A  09/03/2012     PJP:ETKKOE hernia. Gastric diverticulum. Gastric ulcers with associated erosions. Duodenal erosions. Status post gastric biopsy. H.PYLORI gastritis   .  Esophageal biopsy  N/A  09/03/2012     Procedure: BIOPSY; Surgeon: Daneil Dolin, MD; Location: [facility]; Service: Endoscopy; Laterality: N/A; gastric and gastric mucosa   .  Polypectomy  N/A  09/03/2012     Procedure: POLYPECTOMY; Surgeon: Daneil Dolin, MD; Location: [facility]; Service: Endoscopy; Laterality: N/A; cecal polyp   .  Esophagogastroduodenoscopy (egd) with propofol  N/A  12/03/2012     Procedure: ESOPHAGOGASTRODUODENOSCOPY (EGD) WITH PROPOFOL; Surgeon:  Daneil Dolin, MD; Location: [facility]; Service: Endoscopy; Laterality: N/A;   .  Esophageal biopsy  N/A  12/03/2012     Procedure: BIOPSY; Surgeon: Daneil Dolin, MD; Location: [facility]; Service: Endoscopy; Laterality: N/A;    Family History   Problem  Relation  Age of Onset   .  Breast cancer  Mother      deceased   .  Cancer  Mother    .  Diabetes  Father    .  Hypertension  Father    .  Heart disease  Father      deceased   .  Hyperlipidemia  Father    .  Diabetes  Sister    .   Hypertension  Sister    .  Breast cancer  Sister    .  Colon cancer  Neg Hx    .  Liver disease  Neg Hx     Social History: Disabled due to stroke. 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 Lives alone. Sedentary--gait distance limited due to RLE weakness but independent PTA-. He reports that he has been smoking Cigarettes--1 pack/ few days. He has a 10 pack-year smoking history. He has never used smokeless tobacco. He denies using alcohol and reports used cocaine last month.   Allergies: No Known Allergies  Medications Prior to Admission   Medication  Sig  Dispense  Refill   .  aspirin 81 MG tablet  Take 81 mg by mouth daily.     Marland Kitchen  esomeprazole (NEXIUM) 40 MG capsule  TAKE ONE CAPSULE BY MOUTH DAILY BEFORE BREAKFAST.  30 capsule  11   .  gabapentin (NEURONTIN) 300 MG capsule  Take 300 mg by mouth 2 (two) times daily.     Marland Kitchen  glipiZIDE (GLUCOTROL) 2.5 mg TABS  Take 5 mg by mouth 2 (two) times daily before a meal.     .  Linaclotide (LINZESS) 290 MCG CAPS capsule  Take 1 capsule (290 mcg total) by mouth daily. 30 minutes before breakfast.  30 capsule  3   .  lisinopril-hydrochlorothiazide (PRINZIDE,ZESTORETIC) 20-12.5 MG per tablet  Take 1 tablet by mouth 2 (two) times daily.     .  metFORMIN (GLUCOPHAGE) 500 MG tablet  Take 500 mg by mouth 2 (two) times daily with a meal.     .  simvastatin (ZOCOR) 20 MG tablet  Take 20 mg by mouth daily at 6 PM.     .  amLODipine (NORVASC) 5 MG tablet  Take 1 tablet (5 mg total) by mouth daily.  90 tablet  1    Home:  Home Living  Family/patient expects to be discharged to:: Skilled nursing facility  Living Arrangements: Alone  Available Help at Discharge: Family;Available PRN/intermittently  Type of Home: House  Home Access: Stairs to enter  [employer] of Steps: 3  Entrance Stairs-Rails: None  Home Layout: One level  Home Equipment: Cane - single point  Functional History:  Prior Function  Level of Independence: Independent with assistive  device(s)  Comments: Pt worked Engineer, production until CVA 3 years ago. Since then he has been overall just walking household distances with can at times and is I with basic adls.  Functional Status:  Mobility:  Bed Mobility  Overal bed mobility: Needs Assistance  Bed Mobility: Rolling;Sidelying to Sit  Rolling: Min guard  Sidelying to sit: Min assist;HOB elevated  General bed mobility comments: cues for technique, assisted right leg off bed, cues to use rail  Transfers  Overall transfer level: Needs assistance  Equipment used: Rolling walker (2 wheeled)  Transfers: Sit to/from Stand  Sit to Stand: Mod assist  General transfer comment: cues for UE's on bed, and to relay on left leg, increased time to transition hands to walker  Ambulation/Gait  Ambulation/Gait assistance: Min assist  Ambulation Distance (Feet): 20 Feet  Assistive device: Rolling walker (2 wheeled)  Gait Pattern/deviations: Step-through pattern  General Gait Details: cues for sequence and to rely on UE's for stability due to right LE weakness, noted some difficulty with right LE placement with decreased proprioception   ADL:  ADL  Overall ADL's : Needs assistance/impaired  Eating/Feeding: Set up;Sitting  Grooming: Set up;Sitting  Upper Body Bathing: Set up;Sitting  Lower Body Bathing: Moderate assistance;Sit to/from stand  Lower Body Bathing Details (indicate cue type and reason): assist to reach ankles and below. Pt cannot cross legs up at this time. Needs to be introduced to AE.  Upper Body Dressing : Set up;Sitting  Lower Body Dressing: Maximal assistance;Sit to/from stand  Lower Body Dressing Details (indicate cue type and reason): assist to start pants over feet, donn socks and shoes. Assist to stand to pull pants up. Pt cannot let go of walker at this time to do ADLS in standing.  Toilet Transfer: Moderate assistance;Stand-pivot  Toilet Transfer Details (indicate cue type and reason): Pt R leg buckles. Pt relies  on UEs to transfer with walker.  Toileting- Clothing Manipulation and Hygiene: Moderate assistance;Sit to/from stand  Functional mobility during ADLs: Moderate assistance;Rolling walker  General ADL Comments: Pt struggles with all adls mobility in standing and all LE adls due to pain, LE weakness and poor balance in standing.  Cognition:  Cognition  Overall Cognitive Status: Within Functional Limits for tasks assessed  Orientation Level: Oriented X4  Cognition  Arousal/Alertness: Awake/alert  Behavior During Therapy: WFL for tasks assessed/performed  Overall Cognitive Status: Within Functional Limits for tasks assessed   Physical Exam:  Blood pressure 123/72, pulse 79, temperature 98.4 F (36.9 C), temperature source Oral, resp. rate 18, height _0  (1.854 m), weight 91.173 kg (201 lb), SpO2 97.00%.  Constitutional: He is oriented to person, place, and time. He appears well-developed and well-nourished.  HENT:  Head: Normocephalic and atraumatic.  Eyes: Conjunctivae are normal. Pupils are equal, round, and reactive to light.  Neck: Normal range of motion. Neck supple.  Cardiovascular: Normal rate and regular rhythm.  Respiratory: Effort normal and breath sounds normal. No respiratory distress. He has no wheezes.  GI: Soft. Bowel sounds are normal. He exhibits no distension. There is no tenderness.  Musculoskeletal: He exhibits no edema and no tenderness.  Neurological: He is alert and oriented to person, place, and time.  Follow commands without difficulty. Motor strength is 5/5 bilateral deltoid, biceps, triceps, grip  5/5 left hip flexor, knee extensor ankle dorsiflexor. RLE- 2 minus to 2/5 hip flexor right, 2/5 knee extensor right, 4/5 ankle dorsi and plantar flexor right  Sensation reduced in the right L3 dermatomal distribution  Skin: Skin is warm and dry. Incision intact with moderate to large amount of serosanguinous drainage on ABD pad. Fluctuant area noted to the left of  incision.    Results for orders placed during the hospital encounter of 11/23/13 (from the past 48 hour(s))   GLUCOSE, CAPILLARY Status: Abnormal    Collection Time    11/27/13 11:28 AM   Result  Value  Ref Range    Glucose-Capillary  162 (*)  70 -  99 mg/dL   GLUCOSE, CAPILLARY Status: Abnormal    Collection Time    11/27/13 4:25 PM   Result  Value  Ref Range    Glucose-Capillary  225 (*)  70 - 99 mg/dL   GLUCOSE, CAPILLARY Status: Abnormal    Collection Time    11/27/13 9:21 PM   Result  Value  Ref Range    Glucose-Capillary  172 (*)  70 - 99 mg/dL   GLUCOSE, CAPILLARY Status: Abnormal    Collection Time    11/28/13 6:34 AM   Result  Value  Ref Range    Glucose-Capillary  173 (*)  70 - 99 mg/dL   CBC Status: Abnormal    Collection Time    11/28/13 6:45 AM   Result  Value  Ref Range    WBC  10.5  4.0 - 10.5 K/uL    Comment:  WHITE COUNT CONFIRMED ON SMEAR    RBC  3.62 (*)  4.22 - 5.81 MIL/uL    Hemoglobin  8.6 (*)  13.0 - 17.0 g/dL    HCT  28.7 (*)  39.0 - 52.0 %    MCV  79.3  78.0 - 100.0 fL    MCH  23.8 (*)  26.0 - 34.0 pg    MCHC  30.0  30.0 - 36.0 g/dL    RDW  17.3 (*)  11.5 - 15.5 %    Platelets  235  150 - 400 K/uL   BASIC METABOLIC PANEL Status: Abnormal    Collection Time    11/28/13 6:45 AM   Result  Value  Ref Range    Sodium  131 (*)  137 - 147 mEq/L    Potassium  4.1  3.7 - 5.3 mEq/L    Chloride  93 (*)  96 - 112 mEq/L    CO2  28  19 - 32 mEq/L    Glucose, Bld  171 (*)  70 - 99 mg/dL    BUN  15  6 - 23 mg/dL    Creatinine, Ser  1.07  0.50 - 1.35 mg/dL    Calcium  9.0  8.4 - 10.5 mg/dL    GFR calc non Af Amer  73 (*)  >90 mL/min    GFR calc Af Amer  85 (*)  >90 mL/min    Comment:  (NOTE)     The eGFR has been calculated using the CKD EPI equation.     This calculation has not been validated in all clinical situations.     eGFR's persistently <90 mL/min signify possible Chronic Kidney     Disease.    Anion gap  10  5 - 15   GLUCOSE, CAPILLARY  Status: Abnormal    Collection Time    11/28/13 11:21 AM   Result  Value  Ref Range    Glucose-Capillary  218 (*)  70 - 99 mg/dL   GLUCOSE, CAPILLARY Status: Abnormal    Collection Time    11/28/13 4:14 PM   Result  Value  Ref Range    Glucose-Capillary  167 (*)  70 - 99 mg/dL   GLUCOSE, CAPILLARY Status: Abnormal    Collection Time    11/28/13 9:21 PM   Result  Value  Ref Range    Glucose-Capillary  185 (*)  70 - 99 mg/dL   CBC Status: Abnormal    Collection Time    11/29/13 5:37 AM   Result  Value  Ref Range    WBC  7.4  4.0 - 10.5 K/uL    RBC  3.75 (*)  4.22 - 5.81 MIL/uL    Hemoglobin  8.9 (*)  13.0 - 17.0 g/dL    HCT  29.0 (*)  39.0 - 52.0 %    MCV  77.3 (*)  78.0 - 100.0 fL    MCH  23.7 (*)  26.0 - 34.0 pg    MCHC  30.7  30.0 - 36.0 g/dL    RDW  16.9 (*)  11.5 - 15.5 %    Platelets  288  150 - 400 K/uL   BASIC METABOLIC PANEL Status: Abnormal    Collection Time    11/29/13 5:37 AM   Result  Value  Ref Range    Sodium  130 (*)  137 - 147 mEq/L    Potassium  3.8  3.7 - 5.3 mEq/L    Chloride  92 (*)  96 - 112 mEq/L    CO2  28  19 - 32 mEq/L    Glucose, Bld  176 (*)  70 - 99 mg/dL    BUN  11  6 - 23 mg/dL    Creatinine, Ser  0.94  0.50 - 1.35 mg/dL    Calcium  9.0  8.4 - 10.5 mg/dL    GFR calc non Af Amer  88 (*)  >90 mL/min    GFR calc Af Amer  >90  >90 mL/min    Comment:  (NOTE)     The eGFR has been calculated using the CKD EPI equation.     This calculation has not been validated in all clinical situations.     eGFR's persistently <90 mL/min signify possible Chronic Kidney     Disease.    Anion gap  10  5 - 15   GLUCOSE, CAPILLARY Status: Abnormal    Collection Time    11/29/13 6:49 AM   Result  Value  Ref Range    Glucose-Capillary  174 (*)  70 - 99 mg/dL    Comment 1  Notify RN     Comment 2  Documented in Chart     Ct Abdomen Pelvis W Contrast  11/27/2013 CLINICAL DATA: Status post L3-4 laminectomy and fusion 11/25/2013. Fever. EXAM: CT ABDOMEN AND  PELVIS WITH CONTRAST TECHNIQUE: Multidetector CT imaging of the abdomen and pelvis was performed using the standard protocol following bolus administration of intravenous contrast. CONTRAST: 100 mL OMNIPAQUE IOHEXOL 300 MG/ML SOLN COMPARISON: None. FINDINGS: There is no pleural or pericardial effusion. Heart size is normal. Lung bases are clear. The gallbladder, liver, spleen, right adrenal gland, pancreas and kidneys appear normal. Calcification involving the superior aspect of the right adrenal gland may be due to old hemorrhage or infection. Aortoiliac atherosclerosis without aneurysm is identified. There is no intra-abdominal or pelvic fluid collection. The stomach, small and large bowel and appendix appear normal. Postoperative change of L3-4 laminectomy and fusion is identified. Hardware is intact and appears appropriately positioned. There is a small amount of air and fluid in the surgical site and in the subcutaneous soft tissues of the back. Likely Gel-Foam in the laminectomy bed is also identified. No lytic or sclerotic bony lesion is seen. IMPRESSION: No acute finding abdomen or pelvis. No source for fever is identified. Status post L4-5 fusion. No evidence of complication is identified by abdomen and pelvis CT scan. Electronically Signed By: Inge Rise M.D. On: 11/27/2013 14:19   Medical Problem List and Plan:  1. Functional deficits secondary to Right L3  radiculopathy with sensory as well as motor deficits  2. DVT Prophylaxis/Anticoagulation: Pharmaceutical: Lovenox--added.  3. Pain Management: Discontinue IV morphine and use oxycodone on prn basis. Will add robaxin as needed for muscle pain.  4. Mood: Motivated to work hard to get back to prior LOA. LCSW to follow for evaluation and support.  5. Neuropsych: This patient is capable of making decisions on his own behalf.  6. Skin/Wound Care: Routine pressure relief measures. Monitor wound bid with dressing changes to prevent maceration.  7.  Fluids/Electrolytes/Nutrition: Monitor I/O for adequate hydration. Add nutritional supplement to help promote healing.  8. Acute on chronic iron deficiency anemia: Add iron supplement. Will recheck in am.  9. FUO: Monitor wound for changes in drainage or dehiscence. Check UA/UCS. Encourage IS. Reactive leucocytosis resolved. Monitor for now. Vanc/Zosyn d/c at lunch today. Check dopplers to rule out DVt.  10. Hyponatremia: Likely due to HCTZ as well as ongoing IVF. Will d/c fluids and recheck in am.  11. DM type 2: Will monitor BS with ac/hs checks. Was on glipizide and metformin at home. Hgb A1c- 6.5. Po intake is good. Discontinue lantus and resume oral medications. Continue to use SSI for elevated BS.  12. HTN: Monitor every 8 hours. Continue Norvasc, Lisinopril and HCTZ.  5. H/o Hep C: Not on medications due to h/o cocaine abuse.  84. H/o gastric and duodenal ulcers: Continue protonix.  15. Acute on chronic Constipation: Will schedule lactulose bid for now. Continue linzess daily.  Post Admission Physician Evaluation:  1. Functional deficits secondary to Right L3 radiculopathy with sensory as well as motor deficits.  2. Patient is admitted to receive collaborative, interdisciplinary care between the physiatrist, rehab nursing staff, and therapy team. 3. Patient's level of medical complexity and substantial therapy needs in context of that medical necessity cannot be provided at a lesser intensity of care such as a SNF. 4. Patient has experienced substantial functional loss from his/her baseline which was documented above under the "Functional History" and "Functional Status" headings. Judging by the patient's diagnosis, physical exam, and functional history, the patient has potential for functional progress which will result in measurable gains while on inpatient rehab. These gains will be of substantial and practical use upon discharge in facilitating mobility and self-care at the household  level. 5. Physiatrist will provide 24 hour management of medical needs as well as oversight of the therapy plan/treatment and provide guidance as appropriate regarding the interaction of the two. 6. 24 hour rehab nursing will assist with bladder management, bowel management, safety, skin/wound care, disease management, medication administration, pain management and patient education and help integrate therapy concepts, techniques,education, etc. 7. PT will assess and treat for/with: Lower extremity strength, range of motion, stamina, balance, functional mobility, safety, adaptive techniques and equipment, pain mgt, NMR. Goals are: mod I. 8. OT will assess and treat for/with: ADL's, functional mobility, safety, upper extremity strength, adaptive techniques and equipment, NMR, pain mgt. Goals are: mod I to supervision. Therapy may not yet proceed with showering this patient. 9. SLP will assess and treat for/with: n/a. Goals are: n/a. 10. Case Management and Social Worker will assess and treat for psychological issues and discharge planning. 11. Team conference will be held weekly to assess progress toward goals and to determine barriers to discharge. 12. Patient will receive at least 3 hours of therapy per day at least 5 days per week. 13. ELOS: 10-12 days  14. Prognosis: excellent  Meredith Staggers, MD, [facility] 11/29/2013

## 2013-11-29 NOTE — Progress Notes (Signed)
Physical Therapy Treatment Patient Details Name: Cory Weber MRN: [id number] DOB: [birth year] Today's Date: 11/29/2013    History of Present Illness Patient is a 63 y/o male admitted with three-day history of right leg pain with right leg weakness. He has a history of spondylolisthesis at L3-4 and had a previous disc rupture with a superior free fragment at L3-4 on the right 2 years ago.  Now s/p decompressive lumbar laminectomy L2-3 with PLIF. Pt also with metatarsal fx on RLE from fall in last 3 days.    PT Comments    Pt progressing towards goals. Review of back precautions multiple times during session. Pt requires cueing for adherence to precautions. Pt able to preform all functional mobility tasks with min guard to min assist with use of AD. Current plan remains appropriate.    Follow Up Recommendations  CIR     Equipment Recommendations  Rolling walker with 5" wheels;3in1 (PT)    Recommendations for Other Services       Precautions / Restrictions Precautions Precautions: Fall;Back Precaution Comments: reviewed back precautions Restrictions Weight Bearing Restrictions: No    Mobility  Bed Mobility Overal bed mobility: Needs Assistance Bed Mobility: Rolling;Sidelying to Sit Rolling: Supervision (VC for each component) Sidelying to sit: Min guard       General bed mobility comments: Pt requires cueing for technique to maintain back precautions  Transfers Overall transfer level: Needs assistance Equipment used: Rolling walker (2 wheeled) Transfers: Sit to/from Stand Sit to Stand: Min guard         General transfer comment: VC for technique with RW and maintaining back precautions. No assist required to ascend.  Sit-to-stand performed 4x during session for technique with walker and maintanence of precautions.  Ambulation/Gait Ambulation/Gait assistance: Min assist Ambulation Distance (Feet): 120 Feet Assistive device: Rolling walker (2 wheeled) Gait  Pattern/deviations: Step-through pattern;Antalgic;Drifts right/left;Decreased stance time - left;Decreased weight shift to left     General Gait Details: VC for technique with RW. VC to look up and to increase gait speed. Pt able to increase speed with cueing and improve gait pattern. Min A for stability at trunk and guidance during ambulation due to drifting to R.   Stairs            Wheelchair Mobility    Modified Rankin (Stroke Patients Only)       Balance Overall balance assessment: History of Falls Sitting-balance support: Feet supported Sitting balance-Leahy Scale: Good     Standing balance support: During functional activity;Bilateral upper extremity supported Standing balance-Leahy Scale: Poor Standing balance comment: able to maintain balance without support                    Cognition Arousal/Alertness: Awake/alert Behavior During Therapy: WFL for tasks assessed/performed Overall Cognitive Status: Within Functional Limits for tasks assessed                      Exercises      General Comments General comments (skin integrity, edema, etc.): Review of precautions multiple times with pt, pt with some difficulty remembering back precautions and adhering to them. No reports of pain during treatment 2/2 to receiving medications prior to start of session.        Pertinent Vitals/Pain Pain Assessment: No/denies pain Pain Score: 9  Pain Location: back  Pain Descriptors / Indicators: Burning;Throbbing Pain Intervention(s): Premedicated before session    Home Living  Prior Function            PT Goals (current goals can now be found in the care plan section) Acute Rehab PT Goals Patient Stated Goal: go home PT Goal Formulation: With patient Time For Goal Achievement: 12/10/13 Potential to Achieve Goals: Good Progress towards PT goals: Progressing toward goals    Frequency  Min 5X/week    PT Plan Current  plan remains appropriate    Co-evaluation             End of Session Equipment Utilized During Treatment: Gait belt Activity Tolerance: Patient tolerated treatment well Patient left: in chair;with call bell/phone within reach;with chair alarm set     Time: [id number] PT Time Calculation (min): 22 min  Charges:                       G Codes:      Cephus Tupy 11/29/2013, 2:46 PM Levonne Hubert, SPT

## 2013-11-29 NOTE — Progress Notes (Signed)
Patient Demographics  Cory Weber, is a 63 y.o. male, DOB - [birth year], [id number]  Admit date - 11/23/2013   Admitting Physician Kathie Dike, MD  Outpatient Primary MD for the patient is Maggie Font, MD  LOS - 6   Chief Complaint  Patient presents with  . extremity weakness       Brief narrative: Patient with known history of CVA, diabetes mellitus, hypertension, no residual deficits, presents with significant right lower extremity weakness, MRI of lumbar spine showing significant mass effect on the right L4 nerve root, no acute or subacute CVA on MRI brain, patient is being transferred to [facility] cone 4 ports under Dr. Ronnald Ramp care. Medicine will continue to follow as a consult.  On 11/27/2013 patient developing fever of 103.1  Subjective:   Cory Weber today reporting feeling good, thinks he is moving his right lower extremity better today  Assessment & Plan    Active Problems:   DM (diabetes mellitus), type 2, uncontrolled   Accelerated hypertension   Tobacco abuse   Normocytic anemia   Right leg weakness   Lumbar spinal stenosis   S/P lumbar spinal fusion   Acute right lumbar radiculopathy   Fever presenting with conditions classified elsewhere  Fever -On 11/27/2013 patient developing fever, having TEMP of 103.1 -Had decompressive lumbar laminectomy on 11/25/2013, back examined, there is area of warmth, induration and pain in the left lower back region.  -CT scan of ABD/Pelvis performed on 11/27/2013 showed no acute finding without source of fever being identified. There is no evidence of complication identified from L4-5 fusion -Chest x-ray and urinalysis was unremarkable -Blood cultures showing no growth to date. Unclear source of infection. -Will stop IV antimicrobial therapy and monitor    right lower extremity weakness -Due to  severe lumbar radiculopathy, with compression mass effect on the L4 right nerve root. MRI showing mass at L4 level with compression of nerve root.  -There is no acute finding of MRI brain to suggest acute or subacute stroke, but patient is having significant atherosclerotic disease in multiple vessels in the MRA of the brain, will resume aspirin when patient is more stable. -Patient undergoing decompressive lumbar laminectomy on 11/25/2013, procedure performed by Dr. Ronnald Ramp of neurosurgery  Anemia -Patient is known to have history of iron deficiency anemia, with extensive workup in the past including colonoscopy and endoscopy in 2014, showing gastric ulcer and erosions. -Patient is Hemoccult negative x2 in ED, no evidence of active bleed, melena, or hematemesis. -Patient was transfused 2 units packed red blood cell 11/23/2013  Diabetes mellitus -Will restart glucotrol at 2.5 mg PO BID  Hypertension -Continue Amlodipine 5 mg PO q daily and Lisinopril 20 mg PO BID  Fifth metatarsal fracture -Patient having a fall several days prior to presentation, imaging showing nondisplaced fracture of proximal fifth metatarsal, in anatomic alignment, no dislocation -I spoke with Dr Percell Miller of Orthopedic Surgery, who reviewed films. He was not certain if this actually represented an acute fracture, especially with lack of pain when he stands on his right foot. He recommended outpatient follow up with Dr Percell Miller if he develops pain.   Code Status: Full  Family Communication:   Disposition Plan: Physical therapy recommending SNF placement   Procedures  Decompressive  lumbar laminectomy on 11/25/2013   Medications  Scheduled Meds: . amLODipine  5 mg Oral Daily  . docusate sodium  100 mg Oral BID  . gabapentin  300 mg Oral BID  . lisinopril  20 mg Oral BID   And  . hydrochlorothiazide  12.5 mg Oral BID  . insulin aspart  0-15 Units Subcutaneous TID WC  . insulin aspart  0-5 Units Subcutaneous QHS  .  insulin glargine  12 Units Subcutaneous QHS  . Linaclotide  290 mcg Oral Daily  . pantoprazole  40 mg Oral BID  . polyethylene glycol  17 g Oral Daily  . sodium chloride  3 mL Intravenous Q12H   Continuous Infusions: . sodium chloride    . 0.9 % NaCl with KCl 20 mEq / L 75 mL/hr at 11/27/13 0813   PRN Meds:.acetaminophen, acetaminophen, lactulose, menthol-cetylpyridinium, methocarbamol (ROBAXIN) IV, methocarbamol, morphine injection, ondansetron (ZOFRAN) IV, oxyCODONE-acetaminophen, phenol, polyethylene glycol, senna-docusate, sodium chloride  DVT Prophylaxis  Heparin - SCDs   Lab Results  Component Value Date   PLT 288 11/29/2013    Antibiotics    Anti-infectives   Start     Dose/Rate Route Frequency Ordered Stop   11/28/13 0400  vancomycin (VANCOCIN) 1,250 mg in sodium chloride 0.9 % 250 mL IVPB  Status:  Discontinued     1,250 mg 166.7 mL/hr over 90 Minutes Intravenous Every 12 hours 11/27/13 1654 11/29/13 1146   11/27/13 1400  vancomycin (VANCOCIN) IVPB 1000 mg/200 mL premix  Status:  Discontinued     1,000 mg 200 mL/hr over 60 Minutes Intravenous Every 8 hours 11/27/13 0746 11/27/13 1654   11/27/13 0800  piperacillin-tazobactam (ZOSYN) IVPB 3.375 g  Status:  Discontinued     3.375 g 12.5 mL/hr over 240 Minutes Intravenous 3 times per day 11/27/13 0732 11/29/13 1146   11/27/13 0800  vancomycin (VANCOCIN) 1,500 mg in sodium chloride 0.9 % 500 mL IVPB     1,500 mg 250 mL/hr over 120 Minutes Intravenous  Once 11/27/13 0746 11/27/13 1304   11/27/13 0730  piperacillin-tazobactam (ZOSYN) IVPB 3.375 g  Status:  Discontinued     3.375 g 12.5 mL/hr over 240 Minutes Intravenous 4 times per day 11/27/13 0723 11/27/13 0732   11/26/13 0500  vancomycin (VANCOCIN) IVPB 1000 mg/200 mL premix     1,000 mg 200 mL/hr over 60 Minutes Intravenous  Once 11/25/13 2051 11/26/13 0549   11/25/13 1805  bacitracin 50,000 Units in sodium chloride irrigation 0.9 % 500 mL irrigation  Status:   Discontinued       As needed 11/25/13 1805 11/25/13 1937   11/25/13 1730  vancomycin (VANCOCIN) IVPB 1000 mg/200 mL premix  Status:  Discontinued     1,000 mg 200 mL/hr over 60 Minutes Intravenous To Neuro OR-Station #32 11/25/13 1727 11/25/13 2038          Objective:   Filed Vitals:   11/29/13 0212 11/29/13 0500 11/29/13 0931 11/29/13 1341  BP: 152/77 125/71 123/72 125/89  Pulse: 75 93 79 67  Temp: 98.9 F (37.2 C) 98.5 F (36.9 C) 98.4 F (36.9 C) 98.5 F (36.9 C)  TempSrc: Oral Oral Oral Oral  Resp: 18 18 18 18   Height:      Weight:      SpO2: 97% 100% 97% 100%    Wt Readings from Last 3 Encounters:  11/23/13 91.173 kg (201 lb)  11/23/13 91.173 kg (201 lb)  11/03/13 96.163 kg (212 lb)     Intake/Output Summary (Last  24 hours) at 11/29/13 1517 Last data filed at 11/29/13 1300  Gross per 24 hour  Intake    480 ml  Output    900 ml  Net   -420 ml     Physical Exam  Awake Alert, Oriented X 3, No new F.N deficits, Normal affect [place].AT,PERRAL Supple Neck,No JVD, No cervical lymphadenopathy appriciated.  Symmetrical Chest wall movement, Good air movement bilaterally, CTAB RRR,No Gallops,Rubs or new Murmurs, No Parasternal Heave +ve B.Sounds, Abd Soft, No tenderness, No organomegaly appriciated, No rebound - guarding or rigidity. No Cyanosis, Clubbing or edema, No new Rash or bruise   Right lower extremity weakness, 3 out 5, improved from yesterday's exam Musculoskeletal: Surgical incision sites inspected, no evidence of acute infection. No fluctuant mass, erythema or significant pain noted on this mornings exam  Data Review   Micro Results Recent Results (from the past 240 hour(s))  SURGICAL PCR SCREEN     Status: Abnormal   Collection Time    11/25/13  3:58 AM      Result Value Ref Range Status   MRSA, PCR NEGATIVE  NEGATIVE Final   Staphylococcus aureus POSITIVE (*) NEGATIVE Final   Comment:            The Xpert SA Assay (FDA     approved for NASAL  specimens     in patients over 24 years of age),     is one component of     a comprehensive surveillance     program.  Test performance has     been validated by [employer] for patients greater     than or equal to 14 year old.     It is not intended     to diagnose infection nor to     guide or monitor treatment.  CULTURE, BLOOD (ROUTINE X 2)     Status: None   Collection Time    11/27/13  9:55 AM      Result Value Ref Range Status   Specimen Description BLOOD RIGHT ARM   Final   Special Requests BOTTLES DRAWN AEROBIC AND ANAEROBIC 10CC EACH   Final   Culture  Setup Time     Final   Value: 11/27/2013 19:22     Performed at [facility]   Culture     Final   Value:        BLOOD CULTURE RECEIVED NO GROWTH TO DATE CULTURE WILL BE HELD FOR 5 DAYS BEFORE ISSUING A FINAL NEGATIVE REPORT     Performed at [facility]   Report Status PENDING   Incomplete  CULTURE, BLOOD (ROUTINE X 2)     Status: None   Collection Time    11/27/13 10:06 AM      Result Value Ref Range Status   Specimen Description BLOOD RIGHT HAND   Final   Special Requests BOTTLES DRAWN AEROBIC AND ANAEROBIC 10CC EACH   Final   Culture  Setup Time     Final   Value: 11/27/2013 19:22     Performed at [facility]   Culture     Final   Value:        BLOOD CULTURE RECEIVED NO GROWTH TO DATE CULTURE WILL BE HELD FOR 5 DAYS BEFORE ISSUING A FINAL NEGATIVE REPORT     Performed at [facility]   Report Status PENDING   Incomplete    Radiology Reports No results found.  CBC  Recent Labs Lab 11/23/13 1211  11/25/13 0549 11/26/13 0510 11/27/13 0419 11/28/13 0645 11/29/13 0537  WBC 7.7  < > 10.2 12.3* 11.8* 10.5 7.4  HGB 7.7*  < > 9.7* 9.5* 9.4* 8.6* 8.9*  HCT 25.8*  < > 30.8* 31.3* 30.8* 28.7* 29.0*  PLT 336  < > 318 311 292 235 288  MCV 76.3*  < > 75.7* 79.4 78.0 79.3 77.3*  MCH 22.8*  < > 23.8* 24.1* 23.8* 23.8* 23.7*  MCHC 29.8*  < > 31.5 30.4 30.5 30.0 30.7  RDW 16.8*   < > 16.8* 17.3* 17.6* 17.3* 16.9*  LYMPHSABS 1.6  --   --   --   --   --   --   MONOABS 0.9  --   --   --   --   --   --   EOSABS 0.1  --   --   --   --   --   --   BASOSABS 0.0  --   --   --   --   --   --   < > = values in this interval not displayed.  Chemistries   Recent Labs Lab 11/25/13 0549 11/26/13 0510 11/27/13 0419 11/28/13 0645 11/29/13 0537  NA 133* 133* 131* 131* 130*  K 4.3 4.0 4.1 4.1 3.8  CL 97 96 93* 93* 92*  CO2 24 25 28 28 28   GLUCOSE 286* 196* 163* 171* 176*  BUN 17 16 15 15 11   CREATININE 0.89 1.08 1.18 1.07 0.94  CALCIUM 9.2 9.3 9.1 9.0 9.0   ------------------------------------------------------------------------------------------------------------------ estimated creatinine clearance is 93.3 ml/min (by C-G formula based on Cr of 0.94). ------------------------------------------------------------------------------------------------------------------ No results found for this basename: HGBA1C,  in the last 72 hours ------------------------------------------------------------------------------------------------------------------ No results found for this basename: CHOL, HDL, LDLCALC, TRIG, CHOLHDL, LDLDIRECT,  in the last 72 hours ------------------------------------------------------------------------------------------------------------------ No results found for this basename: TSH, T4TOTAL, FREET3, T3FREE, THYROIDAB,  in the last 72 hours ------------------------------------------------------------------------------------------------------------------ No results found for this basename: VITAMINB12, FOLATE, FERRITIN, TIBC, IRON, RETICCTPCT,  in the last 72 hours  Coagulation profile  Recent Labs Lab 11/23/13 1211  INR 1.09    No results found for this basename: DDIMER,  in the last 72 hours  Cardiac Enzymes  Recent Labs Lab 11/23/13 1211  TROPONINI <0.30    ------------------------------------------------------------------------------------------------------------------ No components found with this basename: POCBNP,      Time Spent in minutes   25 minutes   Kelvin Cellar M.D on 11/29/2013 at 3:17 PM  Between 7am to 7pm - Pager - [phone]  After 7pm go to [email or web address] - password [email or web address]  And look for the night coverage person covering for me after hours  [facility] Office  [phone]   **Disclaimer: This note may have been dictated with voice recognition software. Similar sounding words can inadvertently be transcribed and this note may contain transcription errors which may not have been corrected upon publication of note.**

## 2013-11-29 NOTE — Progress Notes (Signed)
Patient ID: Cory Weber, male   DOB: [birth year], 63 y.o.   MRN: [id number] Patient admitted to 4M06 via bed, escorted by nursing staff.  Patient verbalized understanding of rehab process, no questions asked.  Assessment complete, dressing to back changed, moderate amount of serosanguinous drainage noted.  Will continue to monitor. Brita Romp, RN

## 2013-11-29 NOTE — Progress Notes (Signed)
Reviewed. Continue with current POC.  Alben Deeds, [place] DPT  [phone]

## 2013-11-29 NOTE — Progress Notes (Signed)
Pt discharged to inpatient rehab. Pt transferred to 4M06 by staff via wheelchair. Pt in no acute distress.

## 2013-11-29 NOTE — Progress Notes (Signed)
Patient ID: Cory Weber, male   DOB: [birth year], 63 y.o.   MRN: [id number] Subjective: Patient reports appropriate back soreness. No leg pain. Feels like he is mobilizing okay.  Objective: Vital signs in last 24 hours: Temp:  [97.9 F (36.6 C)-101.5 F (38.6 C)] 98.5 F (36.9 C) (10/19 0500) Pulse Rate:  [75-120] 93 (10/19 0500) Resp:  [18] 18 (10/19 0500) BP: (125-152)/(68-99) 125/71 mmHg (10/19 0500) SpO2:  [97 %-100 %] 100 % (10/19 0500)  Intake/Output from previous day: 10/18 0701 - 10/19 0700 In: 560 [P.O.:560] Out: 400 [Urine:400] Intake/Output this shift:    Neurologic: Grossly normal except right quadricep remains 1-2/5  Lab Results: Lab Results  Component Value Date   WBC 7.4 11/29/2013   HGB 8.9* 11/29/2013   HCT 29.0* 11/29/2013   MCV 77.3* 11/29/2013   PLT 288 11/29/2013   Lab Results  Component Value Date   INR 1.09 11/23/2013   BMET Lab Results  Component Value Date   NA 130* 11/29/2013   K 3.8 11/29/2013   CL 92* 11/29/2013   CO2 28 11/29/2013   GLUCOSE 176* 11/29/2013   BUN 11 11/29/2013   CREATININE 0.94 11/29/2013   CALCIUM 9.0 11/29/2013    Studies/Results: Ct Abdomen Pelvis W Contrast  11/27/2013   CLINICAL DATA:  Status post L3-4 laminectomy and fusion 11/25/2013. Fever.  EXAM: CT ABDOMEN AND PELVIS WITH CONTRAST  TECHNIQUE: Multidetector CT imaging of the abdomen and pelvis was performed using the standard protocol following bolus administration of intravenous contrast.  CONTRAST:  100 mL OMNIPAQUE IOHEXOL 300 MG/ML  SOLN  COMPARISON:  None.  FINDINGS: There is no pleural or pericardial effusion. Heart size is normal. Lung bases are clear.  The gallbladder, liver, spleen, right adrenal gland, pancreas and kidneys appear normal. Calcification involving the superior aspect of the right adrenal gland may be due to old hemorrhage or infection. Aortoiliac atherosclerosis without aneurysm is identified. There is no intra-abdominal or pelvic  fluid collection. The stomach, small and large bowel and appendix appear normal.  Postoperative change of L3-4 laminectomy and fusion is identified. Hardware is intact and appears appropriately positioned. There is a small amount of air and fluid in the surgical site and in the subcutaneous soft tissues of the back. Likely Gel-Foam in the laminectomy bed is also identified. No lytic or sclerotic bony lesion is seen.  IMPRESSION: No acute finding abdomen or pelvis. No source for fever is identified.  Status post L4-5 fusion. No evidence of complication is identified by abdomen and pelvis CT scan.   Electronically Signed   By: Inge Rise M.D.   On: 11/27/2013 14:19   Dg Chest Port 1 View  11/27/2013   CLINICAL DATA:  Fever.  EXAM: PORTABLE CHEST - 1 VIEW  COMPARISON:  Single view of the chest 11/23/2013. PA and lateral chest 02/03/2011.  FINDINGS: Heart size and mediastinal contours are within normal limits. Both lungs are clear. Visualized skeletal structures are unremarkable.  IMPRESSION: Negative exam.   Electronically Signed   By: Inge Rise M.D.   On: 11/27/2013 11:57    Assessment/Plan: Seems to be making the expected recovery. Pain appears to be well controlled. I expect it will take a long time for his right quadricep strength to improve. Hopefully he will be accepted to CIR. continue PTOT   LOS: 6 days    JONES,DAVID S 11/29/2013, 8:16 AM

## 2013-11-29 NOTE — Progress Notes (Signed)
Charlett Blake, MD Physician Signed Physical Medicine and Rehabilitation Consult Note Service date: 11/26/2013 10:44 AM  Related encounter: Admission (Discharged) from 11/23/2013 in [place]           Physical Medicine and Rehabilitation Consult   Reason for Consult: Lumbar spondylolisthesis with HNP and RLE radiculopathy Referring Physician:  Dr. Ronnald Ramp.      HPI: Cory Weber is a 63 y.o. male with history of DM type 2 with peripheral neuropathy, CVA with residual RLE weakness, spondylolisthesis at L3/4 with a previous disc rupture with a superior free fragment at L3-4 on the right 2 years ago. He was admitted via [facility] on 11/24/13 with three day history of falls with worsening of RLE weakness and difficulty walking. He was noted to be anemic with Hgb 7.7 and was transfused with 2 units PRBC for acute on chronic anemia.  MRI spine done revealing spondylolisthesis with L3-4 spinal stenosis and an inferior free fragment compressing the right L4 nerve root and surgical decompression recommended by Dr. Ronnald Ramp. He was taken to OR for decompressive lam L3-L4 with PLIF on 11/25/13. Post op continues to have RLE weakness as well as with difficulty with mobility. MD recommending CIR for progressive therapy.    Patient was up with therapy postop day #1 ambulating min to mod assist to 12 feet. Still has numbness in the right thigh as well as weakness in the right lower   Review of Systems  HENT: Negative for hearing loss.   Eyes: Positive for blurred vision.  Respiratory: Negative for cough and shortness of breath.   Cardiovascular: Negative for chest pain, palpitations and claudication.  Gastrointestinal: Positive for heartburn, abdominal pain (due to constipation) and constipation. Negative for nausea and vomiting.  Genitourinary: Positive for urgency and frequency.  Musculoskeletal: Positive for joint pain and myalgias.  Neurological: Positive  for sensory change (Tingling bilateral feet--chronic. RLE numbness/tingling since 3 days PTA. ) and focal weakness. Negative for headaches.  Psychiatric/Behavioral: The patient does not have insomnia.        Past Medical History   Diagnosis  Date   .  Diabetes mellitus     .  Hypertension     .  Hepatitis C         states he was diagnosed in 2007 or 2007 while living in [place], [place]   .  CVA (cerebral infarction)  01/2011   .  Hyperlipidemia     .  Asthma     .  PVD (peripheral vascular disease)     .  Anemia     .  Stroke     .  [id number]         Past Surgical History   Procedure  Laterality  Date   .  Liver biopsy    2005       Done in [place], [place]. Chronic hepatitis with mild periportal inflammation, lobular unicellular necrosis and portal fibrosis. Grade 2, stage 1-2.   Marland Kitchen  Colonoscopy with propofol  N/A  09/03/2012       [id number] polyp-removed as outlined above. Prominent internal hemorrhoids. Tubular adenoma   .  Esophagogastroduodenoscopy (egd) with propofol  N/A  09/03/2012       [id number] hernia. Gastric diverticulum. Gastric ulcers with associated erosions. Duodenal erosions. Status post gastric biopsy. H.PYLORI gastritis    .  Esophageal biopsy  N/A  09/03/2012       Procedure: BIOPSY;  Surgeon: Daneil Dolin, MD;  Location: [facility];  Service: Endoscopy;  Laterality: N/A;  gastric and gastric mucosa   .  Polypectomy  N/A  09/03/2012       Procedure: POLYPECTOMY;  Surgeon: Daneil Dolin, MD;  Location: [facility];  Service: Endoscopy;  Laterality: N/A;  cecal polyp   .  Esophagogastroduodenoscopy (egd) with propofol  N/A  12/03/2012       Procedure: ESOPHAGOGASTRODUODENOSCOPY (EGD) WITH PROPOFOL;  Surgeon: Daneil Dolin, MD;  Location: [facility];  Service: Endoscopy;  Laterality: N/A;   .  Esophageal biopsy  N/A  12/03/2012       Procedure: BIOPSY;  Surgeon: Daneil Dolin, MD;  Location: [facility];  Service: Endoscopy;  Laterality: N/A;       Family History    Problem  Relation  Age of Onset   .  Breast cancer  Mother         deceased   .  Cancer  Mother     .  Diabetes  Father     .  Hypertension  Father     .  Heart disease  Father         deceased   .  Hyperlipidemia  Father     .  Diabetes  Sister     .  Hypertension  Sister     .  Breast cancer  Sister     .  Colon cancer  Neg Hx     .  Liver disease  Neg Hx        Social History:  Disabled due to stroke. 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  Lives alone. Sedentary--gait distance limited due to RLE weakness but independent PTA-. He   reports that he has been smoking Cigarettes--1 pack/2 days.   He has a 10 pack-year smoking history. He has never used smokeless tobacco. He denies using alcohol or illicit drugs--but reports of ETOH and cocaine use in the past.     Allergies: No Known Allergies    Medications Prior to Admission   Medication  Sig  Dispense  Refill   .  aspirin 81 MG tablet  Take 81 mg by mouth daily.         Marland Kitchen  esomeprazole (NEXIUM) 40 MG capsule  TAKE ONE CAPSULE BY MOUTH DAILY BEFORE BREAKFAST.   30 capsule   11   .  gabapentin (NEURONTIN) 300 MG capsule  Take 300 mg by mouth 2 (two) times daily.         Marland Kitchen  glipiZIDE (GLUCOTROL) 2.5 mg TABS  Take 5 mg by mouth 2 (two) times daily before a meal.          .  Linaclotide (LINZESS) 290 MCG CAPS capsule  Take 1 capsule (290 mcg total) by mouth daily. 30 minutes before breakfast.   30 capsule   3   .  lisinopril-hydrochlorothiazide (PRINZIDE,ZESTORETIC) 20-12.5 MG per tablet  Take 1 tablet by mouth 2 (two) times daily.         .  metFORMIN (GLUCOPHAGE) 500 MG tablet  Take 500 mg by mouth 2 (two) times daily with a meal.         .  simvastatin (ZOCOR) 20 MG tablet  Take 20 mg by mouth daily at 6 PM.         .  amLODipine (NORVASC) 5 MG tablet  Take 1 tablet (5 mg total) by mouth daily.   90 tablet  1      Home: Home Living Family/patient expects to be discharged to:: Skilled nursing facility  (from home) Living Arrangements: Alone Available Help at Discharge: Family;Available PRN/intermittently Type of Home: House Home Access: Stairs to enter Entrance Stairs-Number of Steps: 3 Entrance Stairs-Rails: None Home Layout: One level Home Equipment: Cane - single point   Functional History: Prior Function Level of Independence: Independent Comments: works Architect, now on disability Functional Status:   Mobility: Bed Mobility Overal bed mobility: Needs Assistance Bed Mobility: Rolling;Sidelying to Sit Rolling: Min guard Sidelying to sit: Min assist;HOB elevated General bed mobility comments: cues for technique, assisted right leg off bed, cues to use rail Transfers Overall transfer level: Needs assistance Transfers: Sit to/from Stand Sit to Stand: Mod assist General transfer comment: cues for UE's on bed, and to relay on left leg, increased time to transition hands to walker Ambulation/Gait Ambulation/Gait assistance: Min assist;Mod assist Ambulation Distance (Feet): 12 Feet Assistive device: Rolling walker (2 wheeled) Gait Pattern/deviations: Step-to pattern;Decreased step length - left;Trunk flexed General Gait Details: cues for sequence and to rely on UE's for stability due to right LE weakness, noted some difficulty with right LE placement with decreased proprioception   ADL:   Cognition: Cognition Overall Cognitive Status: Within Functional Limits for tasks assessed Orientation Level: Oriented X4 Cognition Arousal/Alertness: Awake/alert Behavior During Therapy: WFL for tasks assessed/performed Overall Cognitive Status: Within Functional Limits for tasks assessed   Blood pressure 126/99, pulse 73, temperature 99.3 F (37.4 C), temperature source Oral, resp. rate 20, height 6\' 1"  (1.854 m), weight 91.173 kg (201 lb), SpO2 99.00%. Physical Exam  Nursing note and vitals reviewed. Constitutional: He is oriented to person, place, and time. He appears  well-developed and well-nourished.  HENT:   Head: Normocephalic and atraumatic.  Eyes: Conjunctivae are normal. Pupils are equal, round, and reactive to light.  Neck: Normal range of motion. Neck supple.  Cardiovascular: Normal rate and regular rhythm.   Respiratory: Effort normal and breath sounds normal. No respiratory distress. He has no wheezes.  GI: Soft. Bowel sounds are normal. He exhibits no distension. There is no tenderness.  Musculoskeletal: He exhibits no edema and no tenderness.  Neurological: He is alert and oriented to person, place, and time.  Follow commands without difficulty. Sensory deficits RLE with weakness.   Skin: Skin is warm and dry.  motor strength is 5/5 bilateral deltoid, biceps, triceps, grip 5/5 left hip flexor, knee extensor ankle dorsiflexor 2 minus hip flexor right, 2 minus knee extensor right, 4/5 ankle dorsi and plantar flexor right Sensation reduced in the right L3 dermatomal distribution    Results for orders placed during the hospital encounter of 11/23/13 (from the past 24 hour(s))   GLUCOSE, CAPILLARY     Status: Abnormal     Collection Time      11/25/13 12:04 PM       Result  Value  Ref Range     Glucose-Capillary  137 (*)  70 - 99 mg/dL     Comment 1  Notify RN        Comment 2  Documented in Chart      GLUCOSE, CAPILLARY     Status: Abnormal     Collection Time      11/25/13  7:55 PM       Result  Value  Ref Range     Glucose-Capillary  222 (*)  70 - 99 mg/dL   GLUCOSE, CAPILLARY     Status: Abnormal  Collection Time      11/25/13  9:22 PM       Result  Value  Ref Range     Glucose-Capillary  219 (*)  70 - 99 mg/dL     Comment 1  Documented in Chart        Comment 2  Notify RN      BASIC METABOLIC PANEL     Status: Abnormal     Collection Time      11/26/13  5:10 AM       Result  Value  Ref Range     Sodium  133 (*)  137 - 147 mEq/L     Potassium  4.0   3.7 - 5.3 mEq/L     Chloride  96   96 - 112 mEq/L     CO2  25   19 - 32  mEq/L     Glucose, Bld  196 (*)  70 - 99 mg/dL     BUN  16   6 - 23 mg/dL     Creatinine, Ser  1.08   0.50 - 1.35 mg/dL     Calcium  9.3   8.4 - 10.5 mg/dL     GFR calc non Af Amer  72 (*)  >90 mL/min     GFR calc Af Amer  84 (*)  >90 mL/min     Anion gap  12   5 - 15   CBC     Status: Abnormal     Collection Time      11/26/13  5:10 AM       Result  Value  Ref Range     WBC  12.3 (*)  4.0 - 10.5 K/uL     RBC  3.94 (*)  4.22 - 5.81 MIL/uL     Hemoglobin  9.5 (*)  13.0 - 17.0 g/dL     HCT  31.3 (*)  39.0 - 52.0 %     MCV  79.4   78.0 - 100.0 fL     MCH  24.1 (*)  26.0 - 34.0 pg     MCHC  30.4   30.0 - 36.0 g/dL     RDW  17.3 (*)  11.5 - 15.5 %     Platelets  311   150 - 400 K/uL   GLUCOSE, CAPILLARY     Status: Abnormal     Collection Time      11/26/13  6:37 AM       Result  Value  Ref Range     Glucose-Capillary  202 (*)  70 - 99 mg/dL     Comment 1  Documented in Chart        Comment 2  Notify RN      GLUCOSE, CAPILLARY     Status: Abnormal     Collection Time      11/26/13 11:12 AM       Result  Value  Ref Range     Glucose-Capillary  158 (*)  70 - 99 mg/dL     Comment 1  Notify RN       Dg Lumbar Spine 2-3 Views   11/25/2013   CLINICAL DATA:  63 year old male with lumbar stenosis.  EXAM: LUMBAR SPINE - 2-3 VIEW  COMPARISON:  None.  FINDINGS: Intraoperative fluoroscopic spot images.  Posterior lumbar interbody fusion with bilateral pedicle screw and rod fixation of mid lumbar level, potentially L3-L4. No immediate complicating feature.  IMPRESSION: Intraoperative  fluoroscopic spot images of posterior lumbar interbody fusion with bilateral pedicle screw and rod fixation of mid lumbar level. No immediate complicating feature.  Signed,  Dulcy Fanny. Earleen Newport, DO  [facility]  [facility]   Electronically Signed   By: Corrie Mckusick D.O.   On: 11/25/2013 20:21    Dg C-arm 61-120 Min   11/25/2013   CLINICAL DATA:  63 year old male with  lumbar surgery  EXAM: DG C-ARM 61-120 MIN  COMPARISON:  None.  FINDINGS: Limited intraoperative spot images performed.  Surgical changes of posterior lumbar interbody fusion with bilateral pedicle screw and rod fixation of mid lumbar level, potentially L3-L4. No immediate complication identified.  IMPRESSION: Limited fluoroscopic spot images performed intraoperatively. Changes of posterior lumbar interbody fusion with bilateral pedicle screw and rod fixation of mid lumbar level without immediate complicating feature.  Signed,  Dulcy Fanny. Earleen Newport, DO  [facility]  [facility]   Electronically Signed   By: Corrie Mckusick D.O.   On: 11/25/2013 20:20     Assessment/Plan: Diagnosis: Right L3 radiculopathy with sensory as well as motor deficits postop day #1 status post L3-L4 PLIF Does the need for close, 24 hr/day medical supervision in concert with the patient's rehab needs make it unreasonable for this patient to be served in a less intensive setting? Potentially Co-Morbidities requiring supervision/potential complications: Diabetes type 2, anemia requiring transfusion, history of polysubstance abuse, chronic hepatitis C Due to bladder management, bowel management, safety, skin/wound care, disease management, medication administration, pain management and patient education, does the patient require 24 hr/day rehab nursing? Potentially Does the patient require coordinated care of a physician, rehab nurse, PT and OT to address physical and functional deficits in the context of the above medical diagnosis(es)? Potentially Addressing deficits in the following areas: balance, endurance, locomotion, strength, transferring, bowel/bladder control, bathing, dressing, feeding, grooming, toileting and cognition Can the patient actively participate in an intensive therapy program of at least 3 hrs of therapy per day at least 5 days per week? Potentially The potential for  patient to make measurable gains while on inpatient rehab is good Anticipated functional outcomes upon discharge from inpatient rehab are modified independent  with PT, modified independent with OT, n/a with SLP. Estimated rehab length of stay to reach the above functional goals is: ?5-7d Does the patient have adequate social supports to accommodate these discharge functional goals? Potentially Anticipated D/C setting: Home Anticipated post D/C treatments: HH therapy Overall Rehab/Functional Prognosis: good   RECOMMENDATIONS: This patient's condition is appropriate for continued rehabilitative care in the following setting: reassess in a couple days, if still at min mod assist level then CIR recommended otherwise home with home health Patient has agreed to participate in recommended program. Potentially Note that insurance prior authorization may be required for reimbursement for recommended care.   Comment: Rehabilitation admission coordinator to followup on therapy progress       11/26/2013    Revision History...     Date/Time User Action   11/26/2013 4:56 PM Charlett Blake, MD Sign   11/26/2013 11:38 AM Bary Leriche, PA-C Share  View Details Report   Routing History...     Date/Time From To Method   11/26/2013 4:56 PM Charlett Blake, MD Charlett Blake, MD In Basket   11/26/2013 4:56 PM Charlett Blake, MD Iona Beard, MD Fax

## 2013-11-29 NOTE — PMR Pre-admission (Signed)
PMR Admission Coordinator Pre-Admission Assessment  Patient: Cory Weber is an 63 y.o., male MRN: [id number] DOB: 12/28/1950 Height: 6\' 1"  (185.4 cm) Weight: 91.173 kg (201 lb)              Insurance Information HMO:     PPO:      PCP:      IPA:      80/20:      OTHER:  PRIMARY: Medicaid [place] Access      Policy#: [id number]      Subscriber: self Benefits:  Phone #: [phone]      Eff. Date: eligible as of 11-29-13     Deduct: none      Out of Pocket Max: none      Life Max: none CIR: covered      SNF: covered Outpatient: covered     Co-Pay: none Home Health: covered      Co-Pay: none DME: covered     Co-Pay: none Providers: in network  Emergency Contact Information Contact Information   Name Relation Home Work Mobile   [place] Sister [phone]     Benninger,Jackie Sister [phone]       Current Medical History  Patient Admitting Diagnosis: Right L3 radiculopathy with sensory as well as motor deficits  status post L3-L4 PLIF  History of Present Illness: Cory Weber is a 63 y.o. male with history of DM type 2 with peripheral neuropathy, CVA with residual RLE weakness, spondylolisthesis at L3/4 with a previous disc rupture with a superior free fragment at L3-4 on the right 2 years ago. He was admitted via [facility] on 11/24/13 with three day history of falls with worsening of RLE weakness and difficulty walking. He was noted to be anemic with Hgb 7.7 and was transfused with 2 units PRBC for acute on chronic anemia.  MRI spine done revealing spondylolisthesis with L3-4 spinal stenosis and an inferior free fragment compressing the right L4 nerve root and surgical decompression recommended by Dr. Ronnald Ramp. He was taken to OR for decompressive lam L3-L4 with PLIF on 11/25/13. Post op continues to have RLE weakness as well as with difficulty with mobility. MD recommending CIR for progressive therapy.    Past Medical History  Past Medical History  Diagnosis Date  . Diabetes  mellitus   . Hypertension   . Hepatitis C     states he was diagnosed in 2007 or 2007 while living in [place], [place]  . CVA (cerebral infarction) 01/2011  . Hyperlipidemia   . Asthma   . PVD (peripheral vascular disease)   . Anemia   . Stroke   . Headache(784.0)     Family History  family history includes Breast cancer in his mother and sister; Cancer in his mother; Diabetes in his father and sister; Heart disease in his father; Hyperlipidemia in his father; Hypertension in his father and sister. There is no history of Colon cancer or Liver disease.  Prior Rehab/Hospitalizations: none   Current Medications  Current facility-administered medications:0.9 %  sodium chloride infusion, 250 mL, Intravenous, Continuous, Eustace Moore, MD;  0.9 % NaCl with KCl 20 mEq/ L  infusion, , Intravenous, Continuous, Eustace Moore, MD, Last Rate: 75 mL/hr at 11/27/13 0813;  acetaminophen (TYLENOL) suppository 650 mg, 650 mg, Rectal, Q4H PRN, Eustace Moore, MD;  acetaminophen (TYLENOL) tablet 650 mg, 650 mg, Oral, Q4H PRN, Eustace Moore, MD, 650 mg at 11/28/13 1437 amLODipine (NORVASC) tablet 5 mg, 5 mg, Oral, Daily, Kathie Dike, MD,  5 mg at 11/29/13 1231;  docusate sodium (COLACE) capsule 100 mg, 100 mg, Oral, BID, Kelvin Cellar, MD, 100 mg at 11/29/13 1231;  gabapentin (NEURONTIN) capsule 300 mg, 300 mg, Oral, BID, Kathie Dike, MD, 300 mg at 11/29/13 1231;  hydrochlorothiazide (MICROZIDE) capsule 12.5 mg, 12.5 mg, Oral, BID, Kathie Dike, MD, 12.5 mg at 11/29/13 1231 insulin aspart (novoLOG) injection 0-15 Units, 0-15 Units, Subcutaneous, TID WC, Kathie Dike, MD, 3 Units at 11/29/13 1230;  insulin aspart (novoLOG) injection 0-5 Units, 0-5 Units, Subcutaneous, QHS, Kathie Dike, MD, 2 Units at 11/26/13 2251;  insulin glargine (LANTUS) injection 12 Units, 12 Units, Subcutaneous, QHS, Phillips Climes, MD, 12 Units at 11/28/13 2252 lactulose (CHRONULAC) 10 GM/15ML solution 20 g, 20 g, Oral, BID PRN,  Kelvin Cellar, MD, 20 g at 11/28/13 1437;  Linaclotide (LINZESS) capsule 290 mcg, 290 mcg, Oral, Daily, Kathie Dike, MD, 290 mcg at 11/29/13 1231;  lisinopril (PRINIVIL,ZESTRIL) tablet 20 mg, 20 mg, Oral, BID, Kathie Dike, MD, 20 mg at 11/29/13 1230;  menthol-cetylpyridinium (CEPACOL) lozenge 3 mg, 1 lozenge, Oral, PRN, Eustace Moore, MD methocarbamol (ROBAXIN) 500 mg in dextrose 5 % 50 mL IVPB, 500 mg, Intravenous, Q6H PRN, Eustace Moore, MD;  methocarbamol (ROBAXIN) tablet 500 mg, 500 mg, Oral, Q6H PRN, Eustace Moore, MD, 500 mg at 11/27/13 2009;  morphine 2 MG/ML injection 1-4 mg, 1-4 mg, Intravenous, Q3H PRN, Eustace Moore, MD, 4 mg at 11/29/13 0831;  ondansetron [facility]) injection 4 mg, 4 mg, Intravenous, Q4H PRN, Eustace Moore, MD oxyCODONE-acetaminophen (PERCOCET/ROXICET) 5-325 MG per tablet 1-2 tablet, 1-2 tablet, Oral, Q4H PRN, Eustace Moore, MD, 2 tablet at 11/29/13 1230;  pantoprazole (PROTONIX) EC tablet 40 mg, 40 mg, Oral, BID, Eustace Moore, MD, 40 mg at 11/29/13 1230;  phenol (CHLORASEPTIC) mouth spray 1 spray, 1 spray, Mouth/Throat, PRN, Eustace Moore, MD polyethylene glycol (MIRALAX / GLYCOLAX) packet 17 g, 17 g, Oral, Daily PRN, Eustace Moore, MD, 17 g at 11/26/13 1045;  polyethylene glycol (MIRALAX / GLYCOLAX) packet 17 g, 17 g, Oral, Daily, Kelvin Cellar, MD, 17 g at 11/29/13 1232;  senna-docusate (Senokot-S) tablet 1 tablet, 1 tablet, Oral, QHS PRN, Kathie Dike, MD;  sodium chloride 0.9 % injection 3 mL, 3 mL, Intravenous, Q12H, Eustace Moore, MD, 3 mL at 11/29/13 0205 sodium chloride 0.9 % injection 3 mL, 3 mL, Intravenous, PRN, Eustace Moore, MD  Patients Current Diet: Carb Control  Precautions / Restrictions Precautions Precautions: Fall;Back Precaution Booklet Issued: No Precaution Comments: reviewed all back precautions; no bending/twisting/arching Restrictions Weight Bearing Restrictions: No Other Position/Activity Restrictions: Ortho has not proceeded with  consult to give WB precautions   Prior Activity Level Community (5-7x/wk): Pt did get out nearly everyday, walks everywhere and eats at soup kitchen daily. He relies on friends/family/landlord for car transportation. PT noted indicated that pt used a cane. Pt enjoys sitting on his porch.  Home Assistive Devices / Equipment Home Assistive Devices/Equipment: Cane (specify quad or straight) Home Equipment: Cane - single point  Prior Functional Level Prior Function Level of Independence: Independent with assistive device(s) Comments: Pt worked Engineer, production until CVA 3 years ago. Since then he has been overall just walking household distances with cane at times and is I with basic adls.  Current Functional Level Cognition  Overall Cognitive Status: Within Functional Limits for tasks assessed Orientation Level: Oriented X4    Extremity Assessment (includes Sensation/Coordination)          ADLs  Overall  ADL's : Needs assistance/impaired Eating/Feeding: Set up;Sitting Grooming: Set up;Sitting Upper Body Bathing: Set up;Sitting Lower Body Bathing: Moderate assistance;Sit to/from stand Lower Body Bathing Details (indicate cue type and reason): assist to reach ankles and below. Pt cannot cross legs up at this time.  Needs to be introduced to AE. Upper Body Dressing : Set up;Sitting Lower Body Dressing: Maximal assistance;Sit to/from stand Lower Body Dressing Details (indicate cue type and reason): assist to start pants over feet, donn socks and shoes. Assist to stand to pull pants up.  Pt cannot let go of walker at this time to do ADLS in standing. Toilet Transfer: Moderate assistance;Stand-pivot Toilet Transfer Details (indicate cue type and reason): Pt R leg buckles. Pt relies on UEs to transfer with walker. Toileting- Clothing Manipulation and Hygiene: Moderate assistance;Sit to/from stand Functional mobility during ADLs: Moderate assistance;Rolling walker General ADL Comments: Pt  struggles with all adls mobility in standing and all LE adls due to pain, LE weakness and poor balance in standing.    Mobility  Overal bed mobility: Needs Assistance Bed Mobility: Rolling;Sidelying to Sit Rolling: Min guard Sidelying to sit: Min assist;HOB elevated General bed mobility comments: cues for technique, assisted right leg off bed, cues to use rail    Transfers  Overall transfer level: Needs assistance Equipment used: Rolling walker (2 wheeled) Transfers: Sit to/from Stand Sit to Stand: Mod assist General transfer comment: cues for UE's on bed, and to relay on left leg, increased time to transition hands to walker    Ambulation / Gait / Stairs / Wheelchair Mobility  Ambulation/Gait Ambulation/Gait assistance: Museum/gallery curator (Feet): 20 Feet Assistive device: Rolling walker (2 wheeled) Gait Pattern/deviations: Step-through pattern General Gait Details: cues for sequence and to rely on UE's for stability due to right LE weakness, noted some difficulty with right LE placement with decreased proprioception    Posture / Balance   Sitting balance-Leahy Scale: Good Standing balance-Leahy Scale: Poor     Special needs/care consideration BiPAP/CPAP no CPM no  Continuous Drip IV no  Dialysis no          Life Vest no  Oxygen no  Special Bed no  Trach Size no  Wound Vac (area) no        Skin - no issues beside current back incision                              Bowel mgmt: last BM on 11-28-13 Bladder mgmt: using urinal currently Diabetic mgmt - managed at home with medication   Previous Home Environment Living Arrangements: Alone Available Help at Discharge: Family;Available PRN/intermittently Type of Home: House Home Layout: One level Home Access: Stairs to enter Entrance Stairs-Rails: None Entrance Stairs-Number of Steps: 3 Home Care Services: No  Discharge Living Setting Plans for Discharge Living Setting: Patient's home;Apartment Type of Home at  Discharge: Apartment Discharge Home Layout: One level Discharge Home Access: Stairs to enter Entrance Stairs-Rails:  (pt plans to ask landlord for hand rails for 2 step entrance) Entrance Stairs-Number of Steps: 2 Discharge Bathroom Shower/Tub:  (pt states he does have grab bars) Does the patient have any problems obtaining your medications?:  (possibly as pt does not drive. He walks everywhere or his friends/family/landlord give him rides)  Social/Family/Support Systems Patient Roles: Other (Comment) (involved with his friends in his apartment complex/soup kitchen) Contact Information: sisters are primary contact. Sister named Kennyth Lose was present during my interview. Anticipated Caregiver:  none, goals are set for mod. ind. Recommended that his friends/family keep a close eye on pt initially after going home. Sister was in agreement. Pt states that he has friends/landlord that can also keep an eye on him. Anticipated Caregiver's Contact Information: see above Ability/Limitations of Caregiver: NA as pt has goals for Mod Ind and lives alone in his apartment Discharge Plan Discussed with Primary Caregiver:  (discussed with pt and his sister/friend) Is Caregiver In Agreement with Plan?: Yes Does Caregiver/Family have Issues with Lodging/Transportation while Pt is in Rehab?: No  Goals/Additional Needs Patient/Family Goal for Rehab: Mod Ind with PT/OT; NA for SLP Expected length of stay: 5-7 days Cultural Considerations: none Dietary Needs: carb modified diet Equipment Needs: to be determined Pt/Family Agrees to Admission and willing to participate: Yes Program Orientation Provided & Reviewed with Pt/Caregiver Including Roles  & Responsibilities: Yes   Decrease burden of Care through IP rehab admission: NA   Possible need for SNF placement upon discharge: not anticipated  Patient Condition: This patient's medical and functional status has changed since the consult dated: 11-26-13 in which  the Rehabilitation Physician determined and documented that the patient's condition is appropriate for intensive rehabilitative care in an inpatient rehabilitation facility. See "History of Present Illness" (above) for medical update. Functional changes are: minimal assist to ambulate 20' with rolling walker and moderate to maximal assistance with self care tasks. Patient's medical and functional status update has been discussed with the Rehabilitation physician and patient remains appropriate for inpatient rehabilitation. Will admit to inpatient rehab today.  Preadmission Screen Completed By:  Nanetta Batty, PT, 11/29/2013 12:47 PM ______________________________________________________________________   Discussed status with Dr. Naaman Plummer on 11-29-13 at 1237 and received telephone approval for admission today.  Admission Coordinator:  Nanetta Batty, PT, time1237/Date 11-29-13

## 2013-11-29 NOTE — Progress Notes (Signed)
[facility] Admission Coordinator Signed Physical Medicine and Rehabilitation PMR Pre-admission Service date: 11/29/2013 12:36 PM  Related encounter: Admission (Discharged) from 11/23/2013 in [facility]   PMR Admission Coordinator Pre-Admission Assessment  Patient: Cory Weber is an 63 y.o., male  MRN: [id number]  DOB: 24-Jan-1951  Height: 6\' 1"  (185.4 cm)  Weight: 91.173 kg (201 lb)  Insurance Information  HMO: PPO: PCP: IPA: 80/20: OTHER:  PRIMARY: Medicaid [place] Access Policy#: [id number] s Subscriber: self  Benefits: Phone #: [phone]  Eff. Date: eligible as of 11-29-13 Deduct: none Out of Pocket Max: none Life Max: none  CIR: covered SNF: covered  Outpatient: covered Co-Pay: none  Home Health: covered Co-Pay: none  DME: covered Co-Pay: none  Providers: in network   Emergency Contact Information    Contact Information     Name  Relation  Home  Work  Mobile     Garey  Sister  [phone]       Willhelm,Jackie  Sister  [phone]          Current Medical History  Patient Admitting Diagnosis: Right L3 radiculopathy with sensory as well as motor deficits status post L3-L4 PLIF  History of Present Illness: Cory Weber is a 63 y.o. male with history of DM type 2 with peripheral neuropathy, CVA with residual RLE weakness, spondylolisthesis at L3/4 with a previous disc rupture with a superior free fragment at L3-4 on the right 2 years ago. He was admitted via [facility] on 11/24/13 with three day history of falls with worsening of RLE weakness and difficulty walking. He was noted to be anemic with Hgb 7.7 and was transfused with 2 units PRBC for acute on chronic anemia. MRI spine done revealing spondylolisthesis with L3-4 spinal stenosis and an inferior free fragment compressing the right L4 nerve root and surgical decompression recommended by Dr. Ronnald Ramp. He was taken to OR for decompressive lam L3-L4 with PLIF on 11/25/13. Post  op continues to have RLE weakness as well as with difficulty with mobility. MD recommending CIR for progressive therapy.  Past Medical History    Past Medical History    Diagnosis  Date    .  Diabetes mellitus     .  Hypertension     .  Hepatitis C       states he was diagnosed in 2007 or 2007 while living in [place], [place]    .  CVA (cerebral infarction)  01/2011    .  Hyperlipidemia     .  Asthma     .  PVD (peripheral vascular disease)     .  Anemia     .  Stroke     .  Headache(784.0)      Family History  family history includes Breast cancer in his mother and sister; Cancer in his mother; Diabetes in his father and sister; Heart disease in his father; Hyperlipidemia in his father; Hypertension in his father and sister. There is no history of Colon cancer or Liver disease.  Prior Rehab/Hospitalizations: none  Current Medications  Current facility-administered medications:0.9 % sodium chloride infusion, 250 mL, Intravenous, Continuous, Eustace Moore, MD; 0.9 % NaCl with KCl 20 mEq/ L infusion, , Intravenous, Continuous, Eustace Moore, MD, Last Rate: 75 mL/hr at 11/27/13 0813; acetaminophen (TYLENOL) suppository 650 mg, 650 mg, Rectal, Q4H PRN, Eustace Moore, MD; acetaminophen (TYLENOL) tablet 650 mg, 650 mg, Oral, Q4H PRN, Eustace Moore, MD, 650 mg at 11/28/13  1437  amLODipine (NORVASC) tablet 5 mg, 5 mg, Oral, Daily, Kathie Dike, MD, 5 mg at 11/29/13 1231; docusate sodium (COLACE) capsule 100 mg, 100 mg, Oral, BID, Kelvin Cellar, MD, 100 mg at 11/29/13 1231; gabapentin (NEURONTIN) capsule 300 mg, 300 mg, Oral, BID, Kathie Dike, MD, 300 mg at 11/29/13 1231; hydrochlorothiazide (MICROZIDE) capsule 12.5 mg, 12.5 mg, Oral, BID, Kathie Dike, MD, 12.5 mg at 11/29/13 1231  insulin aspart (novoLOG) injection 0-15 Units, 0-15 Units, Subcutaneous, TID WC, Kathie Dike, MD, 3 Units at 11/29/13 1230; insulin aspart (novoLOG) injection 0-5 Units, 0-5 Units, Subcutaneous, QHS, Kathie Dike, MD, 2 Units at 11/26/13 2251; insulin glargine (LANTUS) injection 12 Units, 12 Units, Subcutaneous, QHS, Phillips Climes, MD, 12 Units at 11/28/13 2252  lactulose (CHRONULAC) 10 GM/15ML solution 20 g, 20 g, Oral, BID PRN, Kelvin Cellar, MD, 20 g at 11/28/13 1437; Linaclotide (LINZESS) capsule 290 mcg, 290 mcg, Oral, Daily, Kathie Dike, MD, 290 mcg at 11/29/13 1231; lisinopril (PRINIVIL,ZESTRIL) tablet 20 mg, 20 mg, Oral, BID, Kathie Dike, MD, 20 mg at 11/29/13 1230; menthol-cetylpyridinium (CEPACOL) lozenge 3 mg, 1 lozenge, Oral, PRN, Eustace Moore, MD  methocarbamol (ROBAXIN) 500 mg in dextrose 5 % 50 mL IVPB, 500 mg, Intravenous, Q6H PRN, Eustace Moore, MD; methocarbamol (ROBAXIN) tablet 500 mg, 500 mg, Oral, Q6H PRN, Eustace Moore, MD, 500 mg at 11/27/13 2009; morphine 2 MG/ML injection 1-4 mg, 1-4 mg, Intravenous, Q3H PRN, Eustace Moore, MD, 4 mg at 11/29/13 0831; ondansetron [facility]) injection 4 mg, 4 mg, Intravenous, Q4H PRN, Eustace Moore, MD  oxyCODONE-acetaminophen (PERCOCET/ROXICET) 5-325 MG per tablet 1-2 tablet, 1-2 tablet, Oral, Q4H PRN, Eustace Moore, MD, 2 tablet at 11/29/13 1230; pantoprazole (PROTONIX) EC tablet 40 mg, 40 mg, Oral, BID, Eustace Moore, MD, 40 mg at 11/29/13 1230; phenol (CHLORASEPTIC) mouth spray 1 spray, 1 spray, Mouth/Throat, PRN, Eustace Moore, MD  polyethylene glycol (MIRALAX / GLYCOLAX) packet 17 g, 17 g, Oral, Daily PRN, Eustace Moore, MD, 17 g at 11/26/13 1045; polyethylene glycol (MIRALAX / GLYCOLAX) packet 17 g, 17 g, Oral, Daily, Kelvin Cellar, MD, 17 g at 11/29/13 1232; senna-docusate (Senokot-S) tablet 1 tablet, 1 tablet, Oral, QHS PRN, Kathie Dike, MD; sodium chloride 0.9 % injection 3 mL, 3 mL, Intravenous, Q12H, Eustace Moore, MD, 3 mL at 11/29/13 0205  sodium chloride 0.9 % injection 3 mL, 3 mL, Intravenous, PRN, Eustace Moore, MD  Patients Current Diet: Carb Control  Precautions / Restrictions  Precautions  Precautions: Fall;Back   Precaution Booklet Issued: No  Precaution Comments: reviewed all back precautions; no bending/twisting/arching  Restrictions  Weight Bearing Restrictions: No  Other Position/Activity Restrictions: Ortho has not proceeded with consult to give WB precautions  Prior Activity Level  Community (5-7x/wk): Pt did get out nearly everyday, walks everywhere and eats at soup kitchen daily. He relies on friends/family/landlord for car transportation. PT noted indicated that pt used a cane. Pt enjoys sitting on his porch.  Home Assistive Devices / Equipment  Home Assistive Devices/Equipment: Cane (specify quad or straight)  Home Equipment: Cane - single point  Prior Functional Level  Prior Function  Level of Independence: Independent with assistive device(s)  Comments: Pt worked Engineer, production until CVA 3 years ago. Since then he has been overall just walking household distances with cane at times and is I with basic adls.  Current Functional Level    Cognition  Overall Cognitive Status: Within Functional Limits for tasks assessed  Orientation Level: Oriented  X4    Extremity Assessment  (includes Sensation/Coordination)      ADLs  Overall ADL's : Needs assistance/impaired  Eating/Feeding: Set up;Sitting  Grooming: Set up;Sitting  Upper Body Bathing: Set up;Sitting  Lower Body Bathing: Moderate assistance;Sit to/from stand  Lower Body Bathing Details (indicate cue type and reason): assist to reach ankles and below. Pt cannot cross legs up at this time. Needs to be introduced to AE.  Upper Body Dressing : Set up;Sitting  Lower Body Dressing: Maximal assistance;Sit to/from stand  Lower Body Dressing Details (indicate cue type and reason): assist to start pants over feet, donn socks and shoes. Assist to stand to pull pants up. Pt cannot let go of walker at this time to do ADLS in standing.  Toilet Transfer: Moderate assistance;Stand-pivot  Toilet Transfer Details (indicate cue type and reason): Pt R  leg buckles. Pt relies on UEs to transfer with walker.  Toileting- Clothing Manipulation and Hygiene: Moderate assistance;Sit to/from stand  Functional mobility during ADLs: Moderate assistance;Rolling walker  General ADL Comments: Pt struggles with all adls mobility in standing and all LE adls due to pain, LE weakness and poor balance in standing.    Mobility  Overal bed mobility: Needs Assistance  Bed Mobility: Rolling;Sidelying to Sit  Rolling: Min guard  Sidelying to sit: Min assist;HOB elevated  General bed mobility comments: cues for technique, assisted right leg off bed, cues to use rail    Transfers  Overall transfer level: Needs assistance  Equipment used: Rolling walker (2 wheeled)  Transfers: Sit to/from Stand  Sit to Stand: Mod assist  General transfer comment: cues for UE's on bed, and to relay on left leg, increased time to transition hands to walker    Ambulation / Gait / Stairs / Wheelchair Mobility  Ambulation/Gait  Ambulation/Gait assistance: Fish farm manager (Feet): 20 Feet  Assistive device: Rolling walker (2 wheeled)  Gait Pattern/deviations: Step-through pattern  General Gait Details: cues for sequence and to rely on UE's for stability due to right LE weakness, noted some difficulty with right LE placement with decreased proprioception    Posture / Balance  Sitting balance-Leahy Scale: Good  Standing balance-Leahy Scale: Poor    Special needs/care consideration  BiPAP/CPAP no  CPM no  Continuous Drip IV no  Dialysis no  Life Vest no  Oxygen no  Special Bed no  Trach Size no  Wound Vac (area) no  Skin - no issues beside current back incision  Bowel mgmt: last BM on 11-28-13  Bladder mgmt: using urinal currently  Diabetic mgmt - managed at home with medication    Previous Home Environment  Living Arrangements: Alone  Available Help at Discharge: Family;Available PRN/intermittently  Type of Home: House  Home Layout: One level  Home Access:  Stairs to enter  Entrance Stairs-Rails: None  Entrance Stairs-Number of Steps: 3  Home Care Services: No  Discharge Living Setting  Plans for Discharge Living Setting: Patient's home;Apartment  Type of Home at Discharge: Apartment  Discharge Home Layout: One level  Discharge Home Access: Stairs to enter  Entrance Stairs-Rails: (pt plans to ask landlord for hand rails for 2 step entrance)  Entrance Stairs-Number of Steps: 2  Discharge Bathroom Shower/Tub: (pt states he does have grab bars)  Does the patient have any problems obtaining your medications?: (possibly as pt does not drive. He walks everywhere or his friends/family/landlord give him rides)  Social/Family/Support Systems  Patient Roles: Other (Comment) (involved with his friends in his apartment complex/soup  kitchen)  Contact Information: sisters are primary contact. Sister named Kennyth Lose was present during my interview.  Anticipated Caregiver: none, goals are set for mod. ind. Recommended that his friends/family keep a close eye on pt initially after going home. Sister was in agreement. Pt states that he has friends/landlord that can also keep an eye on him.  Anticipated Caregiver's Contact Information: see above  Ability/Limitations of Caregiver: NA as pt has goals for Mod Ind and lives alone in his apartment  Discharge Plan Discussed with Primary Caregiver: (discussed with pt and his sister/friend)  Is Caregiver In Agreement with Plan?: Yes  Does Caregiver/Family have Issues with Lodging/Transportation while Pt is in Rehab?: No  Goals/Additional Needs  Patient/Family Goal for Rehab: Mod Ind with PT/OT; NA for SLP  Expected length of stay: 5-7 days  Cultural Considerations: none  Dietary Needs: carb modified diet  Equipment Needs: to be determined  Pt/Family Agrees to Admission and willing to participate: Yes  Program Orientation Provided & Reviewed with Pt/Caregiver Including Roles & Responsibilities: Yes  Decrease burden of  Care through IP rehab admission: NA  Possible need for SNF placement upon discharge: not anticipated  Patient Condition: This patient's medical and functional status has changed since the consult dated: 11-26-13 in which the Rehabilitation Physician determined and documented that the patient's condition is appropriate for intensive rehabilitative care in an inpatient rehabilitation facility. See "History of Present Illness" (above) for medical update. Functional changes are: minimal assist to ambulate 20' with rolling walker and moderate to maximal assistance with self care tasks. Patient's medical and functional status update has been discussed with the Rehabilitation physician and patient remains appropriate for inpatient rehabilitation. Will admit to inpatient rehab today.  Preadmission Screen Completed By: Nanetta Batty, PT, 11/29/2013 12:47 PM  ______________________________________________________________________  Discussed status with Dr. Naaman Plummer on 11-29-13 at 1237 and received telephone approval for admission today.  Admission Coordinator: Nanetta Batty, PT, time1237/Date 11-29-13    Cosigned by: Meredith Staggers, MD [11/29/2013 1:47 PM]

## 2013-11-29 NOTE — Progress Notes (Signed)
Report called to RN on [facility]. Pt will be transferring to [facility]. All questions answered.

## 2013-11-29 NOTE — Progress Notes (Signed)
UR complete.  Arvada Seaborn RN, MSN 

## 2013-11-29 NOTE — Progress Notes (Signed)
Occupational Therapy Treatment Patient Details Name: Cory Weber MRN: [id number] DOB: 11/18/1950 Today's Date: 11/29/2013    History of present illness Patient is a 63 y/o male admitted with three-day history of right leg pain with right leg weakness. He has a history of spondylolisthesis at L3-4 and had a previous disc rupture with a superior free fragment at L3-4 on the right 2 years ago.  Now s/p decompressive lumbar laminectomy L2-3 with PLIF. Pt also with metatarsal fx on RLE from fall in last 3 days.   OT comments  Pt progressing. Recommending CIR for rehab.   Follow Up Recommendations  CIR    Equipment Recommendations  Tub/shower bench;3 in 1 bedside comode    Recommendations for Other Services Rehab consult    Precautions / Restrictions Precautions Precautions: Fall;Back Precaution Comments: reviewed back precautions Restrictions Weight Bearing Restrictions: No       Mobility Bed Mobility Overal bed mobility: Needs Assistance Bed Mobility: Rolling;Sidelying to Sit Rolling: Min guard Sidelying to sit: Min guard       General bed mobility comments: discussed technique to get back in bed.   Transfers Overall transfer level: Needs assistance Equipment used: Rolling walker (2 wheeled) Transfers: Sit to/from Stand Sit to Stand: Min assist;Min guard              Balance                                   ADL Overall ADL's : Needs assistance/impaired     Grooming: Oral care;Applying deodorant;Min guard;Minimal assistance;Standing (cues for precautions)   Upper Body Bathing: Min guard;Standing   Lower Body Bathing: Minimal assistance;Sit to/from stand   Upper Body Dressing : Set up;Supervision/safety;Sitting   Lower Body Dressing: Set up;Supervision/safety;Sitting/lateral leans;With adaptive equipment (sock)   Toilet Transfer: Minimal assistance;Ambulation;BSC;RW           Functional mobility during ADLs: Rolling walker;Minimal  assistance General ADL Comments: Discussed positioning of pillows. Educated on placement of grooming items and use of cup for teeth care to avoid breaking precautions. Discussed alternative techniqes for LB ADLs (standing, leaning, rolling). Educated on AE for LB ADLs and pt practiced with reacher and sockaid. Educated on dressing technique. Discussed use of bag on walker. Discussed CIR/Rehab options.  Several cues for precautions.      Vision                     Perception     Praxis      Cognition  Awake/Alert Behavior During Therapy: WFL for tasks assessed/performed Overall Cognitive Status: Within Functional Limits for tasks assessed                       Extremity/Trunk Assessment               Exercises     Shoulder Instructions       General Comments      Pertinent Vitals/ Pain       Pain Assessment: 0-10 Pain Score: 9  Pain Location: back  Pain Descriptors / Indicators: Burning;Throbbing Pain Intervention(s): Monitored during session  Home Living                                          Prior Functioning/Environment  Frequency Min 2X/week     Progress Toward Goals  OT Goals(current goals can now be found in the care plan section)  Progress towards OT goals: Progressing toward goals  Acute Rehab OT Goals Patient Stated Goal: go home OT Goal Formulation: With patient Time For Goal Achievement: 12/10/13 Potential to Achieve Goals: Good ADL Goals Pt Will Perform Grooming: with set-up;standing Pt Will Perform Lower Body Bathing: with supervision;with adaptive equipment;sit to/from stand Pt Will Perform Lower Body Dressing: with supervision;with adaptive equipment;sit to/from stand Pt Will Transfer to Toilet: with supervision;ambulating;bedside commode Pt Will Perform Toileting - Clothing Manipulation and hygiene: with supervision;sit to/from stand Pt Will Perform Tub/Shower Transfer: with  supervision;with transfer board;ambulating;rolling walker  Plan Discharge plan needs to be updated    Co-evaluation                 End of Session Equipment Utilized During Treatment: Rolling walker   Activity Tolerance Patient tolerated treatment well   Patient Left in chair;with call bell/phone within reach;with family/visitor present   Nurse Communication          Time: [id number] OT Time Calculation (min): 32 min  Charges: OT General Charges $OT Visit: 1 Procedure OT Treatments $Self Care/Home Management : 23-37 mins  Benito Mccreedy OTR/L [id number] 11/29/2013, 1:33 PM

## 2013-11-29 NOTE — Progress Notes (Signed)
Rehab admissions - I met with pt and his sister/friend in follow up to rehab MD consult. I explained the possibility of inpatient rehab and he is interested in pursuing inpatient rehab. Pt's sister was familiar with our program as she was at [facility] following a knee sx. Further questions were answered and informational brochures were given.  I then called and spoke with Dr. Ronnald Ramp who gave medical clearance for pt to come to inpatient rehab. Bed is available and will admit to CIR later today. I updated Hassan Rowan, case Freight forwarder and left message for Marjorie Smolder with social work.  I will complete admission paperwork shortly with pt. I updated pt and his family by phone and they are pleased with the plan to admit today to inpatient rehab.  Please call me with any questions. Thanks.  Nanetta Batty, PT Rehabilitation Admissions Coordinator [phone]

## 2013-11-30 ENCOUNTER — Inpatient Hospital Stay (HOSPITAL_COMMUNITY): Payer: Medicaid Other

## 2013-11-30 ENCOUNTER — Encounter (HOSPITAL_COMMUNITY): Payer: Self-pay | Admitting: Neurological Surgery

## 2013-11-30 DIAGNOSIS — F1910 Other psychoactive substance abuse, uncomplicated: Secondary | ICD-10-CM

## 2013-11-30 DIAGNOSIS — M4806 Spinal stenosis, lumbar region: Secondary | ICD-10-CM

## 2013-11-30 DIAGNOSIS — R509 Fever, unspecified: Secondary | ICD-10-CM

## 2013-11-30 LAB — URINE CULTURE
Colony Count: NO GROWTH
Culture: NO GROWTH

## 2013-11-30 LAB — GLUCOSE, CAPILLARY
Glucose-Capillary: 108 mg/dL — ABNORMAL HIGH (ref 70–99)
Glucose-Capillary: 79 mg/dL (ref 70–99)
Glucose-Capillary: 69 mg/dL — ABNORMAL LOW (ref 70–99)
Glucose-Capillary: 122 mg/dL — ABNORMAL HIGH (ref 70–99)
Glucose-Capillary: 113 mg/dL — ABNORMAL HIGH (ref 70–99)

## 2013-11-30 LAB — COMPREHENSIVE METABOLIC PANEL
ALT: 15 U/L (ref 0–53)
AST: 20 U/L (ref 0–37)
Albumin: 3.1 g/dL — ABNORMAL LOW (ref 3.5–5.2)
Alkaline Phosphatase: 44 U/L (ref 39–117)
Anion gap: 10 (ref 5–15)
BUN: 11 mg/dL (ref 6–23)
CO2: 29 meq/L (ref 19–32)
CREATININE: 0.99 mg/dL (ref 0.50–1.35)
Calcium: 9.3 mg/dL (ref 8.4–10.5)
Chloride: 95 mEq/L — ABNORMAL LOW (ref 96–112)
GFR calc non Af Amer: 87 mL/min — ABNORMAL LOW (ref >90)
Glucose, Bld: 160 mg/dL — ABNORMAL HIGH (ref 70–99)
Potassium: 4 mEq/L (ref 3.7–5.3)
Sodium: 134 mEq/L — ABNORMAL LOW (ref 137–147)
Total Bilirubin: 0.3 mg/dL (ref 0.3–1.2)
Total Protein: 7.9 g/dL (ref 6.0–8.3)

## 2013-11-30 LAB — CBC WITH DIFFERENTIAL/PLATELET
Basophils Absolute: 0 10*3/uL (ref 0.0–0.1)
Basophils Relative: 0 % (ref 0–1)
Eosinophils Absolute: 0.3 10*3/uL (ref 0.0–0.7)
Eosinophils Relative: 4 % (ref 0–5)
HCT: 28.3 % — ABNORMAL LOW (ref 39.0–52.0)
Hemoglobin: 8.7 g/dL — ABNORMAL LOW (ref 13.0–17.0)
Lymphocytes Relative: 22 % (ref 12–46)
Lymphs Abs: 1.7 10*3/uL (ref 0.7–4.0)
MCH: 24.4 pg — ABNORMAL LOW (ref 26.0–34.0)
MCHC: 30.7 g/dL (ref 30.0–36.0)
MCV: 79.5 fL (ref 78.0–100.0)
Monocytes Absolute: 0.8 10*3/uL (ref 0.1–1.0)
Monocytes Relative: 10 % (ref 3–12)
Neutro Abs: 4.8 10*3/uL (ref 1.7–7.7)
Neutrophils Relative %: 64 % (ref 43–77)
Platelets: 346 10*3/uL (ref 150–400)
RBC: 3.56 MIL/uL — ABNORMAL LOW (ref 4.22–5.81)
RDW: 17.1 % — ABNORMAL HIGH (ref 11.5–15.5)
WBC: 7.5 10*3/uL (ref 4.0–10.5)

## 2013-11-30 MED ORDER — ENOXAPARIN SODIUM 30 MG/0.3ML ~~LOC~~ SOLN
30.0000 mg | Freq: Two times a day (BID) | SUBCUTANEOUS | Status: DC
  Administered 2013-11-30 – 2013-12-04 (×8): 30 mg via SUBCUTANEOUS
  Filled 2013-11-30 (×10): qty 0.3

## 2013-11-30 NOTE — Progress Notes (Signed)
Patient with low BS today despite good po intake. Will d/c glipizide and monitor for now. BLE doppler with peroneal vein DVT-RLE. Will increase Lovenox to bid and check follow up dopplers in a week to rule out propagation. Patient updated on results as well as plan of treatment.

## 2013-11-30 NOTE — Progress Notes (Signed)
Complaining of constipation. Reports having small BM on 10/18, besides that, one week since good BM. Declined supp.this AM, wants to take after last therapy today. BS (+) X 4 quads, bilateral lower quads tender to touch. Awake most of night watching TV, eating and drinking. 2 oxy IR given around 2030 and 0530. Using urinal with staff emptying. PVR=12cc's. Voiding frequently during night. Cory Weber A

## 2013-11-30 NOTE — Progress Notes (Signed)
*  PRELIMINARY RESULTS* Vascular Ultrasound Lower extremity venous duplex has been completed.  Preliminary findings: Evidence of DVT in a small segment of Mid Right peroneal vein. No DVT noted in left lower extremity.   Landry Mellow, RDMS, RVT  11/30/2013, 11:44 AM

## 2013-11-30 NOTE — Evaluation (Signed)
Occupational Therapy Assessment and Plan  Patient Details  Name: Cory Weber MRN: [id number] Date of Birth: [birth year]  OT Diagnosis: muscle weakness (generalized) Rehab Potential: Rehab Potential: Excellent ELOS: 5-7 days   Today's Date: 11/30/2013 OT Individual Time: 2353-6144 OT Individual Time Calculation (min): 60 min     Problem List:  Patient Active Problem List   Diagnosis Date Noted  . Radiculopathy of lumbar region 11/29/2013  . Fever presenting with conditions classified elsewhere 11/27/2013  . Acute right lumbar radiculopathy 11/26/2013  . S/P lumbar spinal fusion 11/25/2013  . Lumbar spinal stenosis 11/24/2013  . Right leg weakness 11/23/2013  . PUD (peptic ulcer disease) 11/19/2012  . IDA (iron deficiency anemia) 07/10/2012  . Chronic hepatitis C 05/13/2012  . Unspecified constipation 05/13/2012  . Peripheral vascular disease, unspecified 01/21/2012  . Hepatitis C antibody test positive 12/03/2011  . Normocytic anemia 12/03/2011  . Melena 12/03/2011  . DM (diabetes mellitus), type 2, uncontrolled 02/03/2011  . Accelerated hypertension 02/03/2011  . Hemiparesthesia 02/03/2011  . Polysubstance abuse 02/03/2011  . Alcohol abuse 02/03/2011  . Tobacco abuse 02/03/2011    Past Medical History:  Past Medical History  Diagnosis Date  . Diabetes mellitus   . Hypertension   . Hepatitis C     states he was diagnosed in 2007 or 2007 while living in [place], [place]  . CVA (cerebral infarction) 01/2011  . Hyperlipidemia   . Asthma   . PVD (peripheral vascular disease)   . Anemia   . Stroke   . RXVQMGQQ(761.9)    Past Surgical History:  Past Surgical History  Procedure Laterality Date  . Liver biopsy  2005    Done in [place], [place]. Chronic hepatitis with mild periportal inflammation, lobular unicellular necrosis and portal fibrosis. Grade 2, stage 1-2.  Marland Kitchen Colonoscopy with propofol N/A 09/03/2012    JKD:TOIZTIW polyp-removed as outlined above. Prominent  internal hemorrhoids. Tubular adenoma  . Esophagogastroduodenoscopy (egd) with propofol N/A 09/03/2012    PYK:DXIPJA hernia. Gastric diverticulum. Gastric ulcers with associated erosions. Duodenal erosions. Status post gastric biopsy. H.PYLORI gastritis   . Esophageal biopsy N/A 09/03/2012    Procedure: BIOPSY;  Surgeon: Daneil Dolin, MD;  Location: [facility];  Service: Endoscopy;  Laterality: N/A;  gastric and gastric mucosa  . Polypectomy N/A 09/03/2012    Procedure: POLYPECTOMY;  Surgeon: Daneil Dolin, MD;  Location: [facility];  Service: Endoscopy;  Laterality: N/A;  cecal polyp  . Esophagogastroduodenoscopy (egd) with propofol N/A 12/03/2012    Procedure: ESOPHAGOGASTRODUODENOSCOPY (EGD) WITH PROPOFOL;  Surgeon: Daneil Dolin, MD;  Location: [facility];  Service: Endoscopy;  Laterality: N/A;  . Esophageal biopsy N/A 12/03/2012    Procedure: BIOPSY;  Surgeon: Daneil Dolin, MD;  Location: [facility];  Service: Endoscopy;  Laterality: N/A;    Assessment & Plan Clinical Impression: Patient is a 63 y.o. year old male with history of DM type 2 with peripheral neuropathy, CVA with residual RLE weakness, spondylolisthesis at L3/4 with a previous disc rupture with a superior free fragment at L3-4 on the right 2 years ago. He was admitted via [facility] on 11/24/13 with three day history of falls with worsening of RLE weakness and difficulty walking. He was noted to be anemic with Hgb 7.7 and was transfused with 2 units PRBC for acute on chronic anemia. MRI spine done revealing spondylolisthesis with L3-4 spinal stenosis and an inferior free fragment compressing the right L4 nerve root and surgical decompression recommended by Dr. Ronnald Ramp. He was taken  to OR for decompressive lam L3-L4 with PLIF on 11/25/13. Post op continues to have RLE weakness as well as with difficulty with mobility.  Patient transferred to CIR on 11/29/2013 .    Patient currently requires overall min- mod assist with basic self-care skills secondary  to muscle weakness and post-op restrictions with movement of L-spine.  Prior to hospitalization, patient could complete BADL independently.  Patient will benefit from skilled intervention to increase independence with basic self-care skills and increase level of independence with iADL prior to discharge home independently.  Anticipate patient will require intermittent supervision and follow up home health.  OT - End of Session Activity Tolerance: Endurance does not limit participation in activity Endurance Deficit: No OT Assessment Rehab Potential: Excellent Barriers to Discharge: Decreased caregiver support OT Patient demonstrates impairments in the following area(s): Safety;Balance OT Basic ADL's Functional Problem(s): Bathing;Dressing;Toileting OT Advanced ADL's Functional Problem(s): Simple Meal Preparation OT Transfers Functional Problem(s): Toilet;Tub/Shower OT Additional Impairment(s): None OT Plan OT Intensity: Minimum of 1-2 x/day, 45 to 90 minutes OT Frequency: 5 out of 7 days OT Duration/Estimated Length of Stay: 5-7 days OT Treatment/Interventions: Therapeutic Exercise;Therapeutic Activities;Self Care/advanced ADL retraining;Patient/family education;Functional mobility training;DME/adaptive equipment instruction;Discharge planning;Balance/vestibular training OT Self Feeding Anticipated Outcome(s): Independent OT Basic Self-Care Anticipated Outcome(s): Mod I OT Toileting Anticipated Outcome(s): Mod I OT Bathroom Transfers Anticipated Outcome(s): Mod I OT Recommendation Patient destination: Home Follow Up Recommendations: Home health OT Equipment Recommended: To be determined   Skilled Therapeutic Intervention OT 1:1 initial evaluation completed with treatment provided to emphasize role of therapies, ward routine, goals and objectives of treatment, DME/AE instruction, and review of back precautions.  Pt not currently approved for bathing and completed pan bath at edge of bed  with setup assist and use of LH sponge to wash feet.   Pt was unable to maintain balance during 1 sit><stand and therefore did not ambulate with therapist during eval.   Pt left at edge of bed at end of session; RN notified of need for RN tech assist to complete bath and dressing.   OT Evaluation Precautions/Restrictions  Precautions Precautions: Fall;Back Precaution Comments: Pt able to name 2/3 precautions independently, 3/3 w/ mod cueing on PT eval Restrictions Weight Bearing Restrictions: No  General Chart Reviewed: Yes Family/Caregiver Present: No  Vital Signs Therapy Vitals BP: 140/77 mmHg  Pain Pain Assessment Pain Assessment: 0-10 Pain Score: 9  Pain Type: Acute pain Pain Location: Abdomen Pain Orientation: Lower Pain Descriptors / Indicators: Aching Pain Onset: Gradual Pain Intervention(s): Medication (See eMAR)  Home Living/Prior Functioning Home Living Family/patient expects to be discharged to:: Private residence Living Arrangements: Alone Available Help at Discharge: Family;Available PRN/intermittently Type of Home: Apartment Home Access: Stairs to enter Entrance Stairs-Number of Steps: 2 steps Entrance Stairs-Rails: None (Reports landlord is putting rails on stairs) Home Layout: One level  Lives With: Alone IADL History Homemaking Responsibilities: Yes Meal Prep Responsibility: Primary Laundry Responsibility: No Cleaning Responsibility: Primary Bill Paying/Finance Responsibility: Primary Shopping Responsibility: Primary Child Care Responsibility: No Current License: No Education: HS + 1 yr CC Type of Occupation: Architect, pt time (odd jobs) Leisure and Hobbies: none, visiting male friends Prior Function Level of Independence: Independent with basic ADLs;Requires assistive device for independence (Used cane PTA)  Able to Take Stairs?: Yes Driving: No Vocation: On disability Comments: Pt worked Engineer, production until CVA 3 years ago. Since then  he has been overall just walking household distances with can at times and is I with basic adls.  ADL ADL ADL  Comments: see FIM  Vision/Perception  Vision- History Baseline Vision/History: No visual deficits;Wears glasses Wears Glasses: Reading only Patient Visual Report: No change from baseline Vision- Assessment Vision Assessment?: No apparent visual deficits Perception Comments: WFL   Cognition Overall Cognitive Status: Within Functional Limits for tasks assessed Arousal/Alertness: Awake/alert Orientation Level: Oriented X4 Attention: Alternating Alternating Attention: Appears intact Memory: Appears intact Awareness: Appears intact Problem Solving: Appears intact Safety/Judgment: Appears intact  Sensation Sensation Light Touch: Appears Intact (Pt reports tingling in R leg below knee) Stereognosis: Appears Intact Hot/Cold: Appears Intact Proprioception: Appears Intact Coordination Gross Motor Movements are Fluid and Coordinated: No Fine Motor Movements are Fluid and Coordinated: Yes Coordination and Movement Description: Slow stiff movements  Motor  Motor Motor: Within Functional Limits  Mobility  Bed Mobility Bed Mobility: Rolling Right;Rolling Left;Sit to Supine;Supine to Sit Rolling Right: 5: Supervision Rolling Right Details: Verbal cues for precautions/safety Rolling Left: 5: Supervision Rolling Left Details: Verbal cues for precautions/safety Supine to Sit: 5: Supervision Supine to Sit Details: Verbal cues for precautions/safety;Verbal cues for sequencing Sitting - Scoot to Edge of Bed: 5: Supervision Sit to Supine: 4: Min assist Sit to Supine - Details: Verbal cues for sequencing;Verbal cues for precautions/safety Sit to Supine - Details (indicate cue type and reason): assist for managing RLE into bed Transfers Transfers: Sit to Stand Sit to Stand: 5: Supervision Sit to Stand Details: Verbal cues for precautions/safety;Verbal cues for safe use of  DME/AE Sit to Stand Details (indicate cue type and reason): Attempted 1X, right knee buckled   Trunk/Postural Assessment  Cervical Assessment Cervical Assessment: Within Functional Limits Thoracic Assessment Thoracic Assessment: Within Functional Limits Lumbar Assessment Lumbar Assessment: Exceptions to [facility] (Limited d/t precautions) Postural Control Postural Control: Within Functional Limits   Balance Balance Balance Assessed: Yes Static Sitting Balance Static Sitting - Balance Support: Feet supported Static Sitting - Level of Assistance: 6: Modified independent (Device/Increase time) Dynamic Sitting Balance Dynamic Sitting - Balance Support: Feet supported;During functional activity Dynamic Sitting - Level of Assistance: 5: Stand by assistance Dynamic Sitting - Balance Activities: Lateral lean/weight shifting;Forward lean/weight shifting Static Standing Balance Static Standing - Balance Support: During functional activity;Left upper extremity supported Static Standing - Level of Assistance: 4: Min assist Dynamic Standing Balance Dynamic Standing - Balance Support: During functional activity;Left upper extremity supported Dynamic Standing - Level of Assistance: 4: Min assist Dynamic Standing - Balance Activities: Lateral lean/weight shifting;Forward lean/weight shifting  Extremity/Trunk Assessment RUE Assessment RUE Assessment: Exceptions to [facility] RUE Strength RUE Overall Strength: Deficits (4/5, grossly) LUE Assessment LUE Assessment: Within Functional Limits  FIM:  FIM - Eating Eating Activity: 7: Complete independence:no helper;6: Assistive device: dentures FIM - Grooming Grooming Steps: Wash, rinse, dry face;Wash, rinse, dry hands;Shave or apply make-up;Oral care, brush teeth, clean dentures Grooming: 5: Set-up assist to obtain items FIM - Bathing Bathing Steps Patient Completed: Chest;Right Arm;Left Arm;Abdomen;Front perineal area;Right upper leg;Left upper  leg;Buttocks (pan bath at edge of bed) Bathing: 4: Min-Patient completes 8-9 [id number] parts or 75+ percent FIM - Upper Body Dressing/Undressing Upper body dressing/undressing steps patient completed: Thread/unthread right sleeve of pullover shirt/dresss;Thread/unthread left sleeve of pullover shirt/dress;Put head through opening of pull over shirt/dress;Pull shirt over trunk Upper body dressing/undressing: 5: Set-up assist to: Obtain clothing/put away FIM - Lower Body Dressing/Undressing Lower body dressing/undressing steps patient completed: Pull pants up/down Lower body dressing/undressing: 2: Max-Patient completed 25-49% of tasks   Refer to Care Plan for Long Term Goals  Recommendations for other services: None  Discharge Criteria:  Patient will be discharged from OT if patient refuses treatment 3 consecutive times without medical reason, if treatment goals not met, if there is a change in medical status, if patient makes no progress towards goals or if patient is discharged from hospital.  The above assessment, treatment plan, treatment alternatives and goals were discussed and mutually agreed upon: by patient  [facility] 11/30/2013, 12:01 PM

## 2013-11-30 NOTE — Progress Notes (Signed)
Hypoglycemic Event  CBG: 69 Treatment: 15 GM carbohydrate snack  Symptoms: None  Follow-up CBG: Time:1643 CBG Result:122  Possible Reasons for Event: Unknown  Comments/MD notified:Pamela Love PA notified. New order received    Cory Weber, Cory Weber  Remember to initiate Hypoglycemia Order Set & complete

## 2013-11-30 NOTE — Evaluation (Signed)
Physical Therapy Assessment and Plan  Patient Details  Name: Cory Weber MRN: [id number] Date of Birth: 01-Dec-1950  PT Diagnosis: Abnormality of gait, Difficulty walking, Low back pain and Muscle weakness, Impaired Balance Rehab Potential: Excellent ELOS: 5-7 days   Today's Date: 11/30/2013 PT Individual Time: 0900-1000 PT Individual Time Calculation (min): 60 min   Session 2 Time: 1300-1400 Time Calculation (min): 60 min  Problem List:  Patient Active Problem List   Diagnosis Date Noted  . Radiculopathy of lumbar region 11/29/2013  . Fever presenting with conditions classified elsewhere 11/27/2013  . Acute right lumbar radiculopathy 11/26/2013  . S/P lumbar spinal fusion 11/25/2013  . Lumbar spinal stenosis 11/24/2013  . Right leg weakness 11/23/2013  . PUD (peptic ulcer disease) 11/19/2012  . IDA (iron deficiency anemia) 07/10/2012  . Chronic hepatitis C 05/13/2012  . Unspecified constipation 05/13/2012  . Peripheral vascular disease, unspecified 01/21/2012  . Hepatitis C antibody test positive 12/03/2011  . Normocytic anemia 12/03/2011  . Melena 12/03/2011  . DM (diabetes mellitus), type 2, uncontrolled 02/03/2011  . Accelerated hypertension 02/03/2011  . Hemiparesthesia 02/03/2011  . Polysubstance abuse 02/03/2011  . Alcohol abuse 02/03/2011  . Tobacco abuse 02/03/2011    Past Medical History:  Past Medical History  Diagnosis Date  . Diabetes mellitus   . Hypertension   . Hepatitis C     states he was diagnosed in 2007 or 2007 while living in [place], [place]  . CVA (cerebral infarction) 01/2011  . Hyperlipidemia   . Asthma   . PVD (peripheral vascular disease)   . Anemia   . Stroke   . JQZESPQZ(300.7)    Past Surgical History:  Past Surgical History  Procedure Laterality Date  . Liver biopsy  2005    Done in [place], [place]. Chronic hepatitis with mild periportal inflammation, lobular unicellular necrosis and portal fibrosis. Grade 2, stage 1-2.  Marland Kitchen  Colonoscopy with propofol N/A 09/03/2012    MAU:QJFHLKT polyp-removed as outlined above. Prominent internal hemorrhoids. Tubular adenoma  . Esophagogastroduodenoscopy (egd) with propofol N/A 09/03/2012    GYB:WLSLHT hernia. Gastric diverticulum. Gastric ulcers with associated erosions. Duodenal erosions. Status post gastric biopsy. H.PYLORI gastritis   . Esophageal biopsy N/A 09/03/2012    Procedure: BIOPSY;  Surgeon: Daneil Dolin, MD;  Location: [facility];  Service: Endoscopy;  Laterality: N/A;  gastric and gastric mucosa  . Polypectomy N/A 09/03/2012    Procedure: POLYPECTOMY;  Surgeon: Daneil Dolin, MD;  Location: [facility];  Service: Endoscopy;  Laterality: N/A;  cecal polyp  . Esophagogastroduodenoscopy (egd) with propofol N/A 12/03/2012    Procedure: ESOPHAGOGASTRODUODENOSCOPY (EGD) WITH PROPOFOL;  Surgeon: Daneil Dolin, MD;  Location: [facility];  Service: Endoscopy;  Laterality: N/A;  . Esophageal biopsy N/A 12/03/2012    Procedure: BIOPSY;  Surgeon: Daneil Dolin, MD;  Location: [facility];  Service: Endoscopy;  Laterality: N/A;    Assessment & Plan Clinical Impression: Cory Weber is a 63 y.o. male with history of DM type 2 with peripheral neuropathy, Hep C, CVA with residual RLE weakness, spondylolisthesis at L3/4 with a previous disc rupture with a superior free fragment at L3-4 on the right 2 years ago. He was admitted via [facility] on 11/24/13 with three day history of falls with worsening of RLE weakness and difficulty walking. Right foot X rays with nondisplaced fracture of proximal 5 th MT without dislocation. He was noted to be anemic with Hgb 7.7 and was transfused with 2 units PRBC for acute on  chronic anemia. MRI spine done revealing spondylolisthesis with L3-4 spinal stenosis and an inferior free fragment compressing the right L4 nerve root and surgical decompression recommended by Dr. Ronnald Ramp. He was taken to OR for decompressive lam L3-L4 with PLIF on 11/25/13.   Post op continues to have  RLE weakness with numbness as well as with problems with mobility. He has had fevers in evenings and CT abd/pelvis without acute findings. CXR negative. Blood culture X 2 10/17--negative so far. He was started on IV Vanc/zosyn empirically and antibiotics d/c today. CIR recommended by MD and therapy team and patient admitted 11/29/2013.    Patient currently requires min with mobility secondary to muscle weakness and decreased standing balance and difficulty maintaining precautions.  Prior to hospitalization, patient was modified independent  with mobility and lived with Alone in a [place] home.  Home access is 2 stepsStairs to enter.  Patient will benefit from skilled PT intervention to maximize safe functional mobility, minimize fall risk and decrease caregiver burden for planned discharge home alone.  Anticipate patient will benefit from follow up OP at discharge.  PT - End of Session Activity Tolerance: Tolerates 30+ min activity without fatigue;Endurance does not limit participation in activity Endurance Deficit: No PT Assessment Rehab Potential: Excellent Barriers to Discharge: Inaccessible home environment;Decreased caregiver support PT Patient demonstrates impairments in the following area(s): Balance;Motor;Pain;Other (comment) (strength, gait) PT Transfers Functional Problem(s): Bed Mobility;Bed to Chair;Car;Furniture;Floor PT Locomotion Functional Problem(s): Ambulation PT Plan PT Intensity: Minimum of 1-2 x/day ,45 to 90 minutes PT Frequency: 5 out of 7 days PT Duration Estimated Length of Stay: 5-7 days PT Treatment/Interventions: Ambulation/gait training;Balance/vestibular training;Community reintegration;Discharge planning;DME/adaptive equipment instruction;Neuromuscular re-education;Functional mobility training;Patient/family education;Stair training;Therapeutic Exercise;Therapeutic Activities;UE/LE Strength taining/ROM;UE/LE Coordination activities PT Transfers Anticipated  Outcome(s): mod (I) PT Locomotion Anticipated Outcome(s): mod (I) PT Recommendation Follow Up Recommendations: Outpatient PT Patient destination: Home Equipment Recommended: Cane Equipment Details: pt owns [facility]  Skilled Therapeutic Intervention Session 1: Pt received seated in w/c, agreeable to participate in therapy. Session focused on functional evaluation, orientation to rehab unit, role of PT, safety in room, falls risk, likely goals and LOS. Ambulated 120' x2 w/ SPC in L hand and MinGuard A. Pt had x2 episodes of knee buckling, able to correct both times and did not require additional assist from therapist. Negotiated up/down 5 stairs w/ MinA, after initial cueing able to maintain correct step pattern. Propelled w/c 19' w/ RLE only for strengthening via forced use. Pt left seated in w/c w/ all needs within reach.   Session 2: Pt received seated on BSC, agreeable to participate in therapy after finishing toileting. Pt ambulated 150' to rehab gym w/ SPC and MinGuard A. Navigated obstacle course including around cones, over low hurdles, stepping onto foam mat. Pt had 1 LOB while stepping on foam mat requiring ModA to correct. Worked on [employer] for RLE sit<>stands at [facility] w/ emphasis on standing w/ R knee in slight flexion and small riser under L foot to emphasize WBing through R. PT stood on foam mat for 30", noted increased sway, 1 LOB w/ MinA to correct. Pt ambulated to ADL apartment w/ 1 LOB requiring ModA to correct. Pt reported stomach pain and need for bathroom. Ambulated back to room w/ SPC and MinGuard A. Pt had large soft BM in BSC. Pt ambulated to ortho gym and navigated up/down 6 stairs w/ MinA and initial cueing for sequencing. Pt ambulated back to room w/ MinGuard A, left seated in recliner w/ all needs within reach.  PT Evaluation Precautions/Restrictions Precautions Precautions: Fall;Back Precaution Comments: Pt able to name 2/3 precautions independently, 3/3 w/ mod cueing on PT  eval Required Braces or Orthoses:  (Pt states per Dr. Ronnald Ramp, knee brace recommended?) Restrictions Weight Bearing Restrictions: No General Chart Reviewed: Yes Vital Signs  Pain Pain Assessment Pain Assessment: No/denies pain Pain Score: 0-No pain Home Living/Prior Functioning Home Living Available Help at Discharge: Family;Available PRN/intermittently Type of Home: Apartment Home Access: Stairs to enter Entrance Stairs-Number of Steps: 2 steps Entrance Stairs-Rails: None (Reports landlord is putting rails on stairs) Home Layout: One level  Lives With: Alone Prior Function Level of Independence: Independent with basic ADLs;Requires assistive device for independence (Used cane PTA)  Able to Take Stairs?: Yes Driving: No Vocation: On disability Comments: Pt worked Engineer, production until CVA 3 years ago. Since then he has been overall just walking household distances with can at times and is I with basic adls. Vision/Perception  Perception Comments: WFL  Cognition Overall Cognitive Status: Within Functional Limits for tasks assessed Arousal/Alertness: Awake/alert Orientation Level: Oriented X4 Attention: Alternating Alternating Attention: Appears intact Memory: Appears intact Awareness: Appears intact Problem Solving: Appears intact Safety/Judgment: Appears intact Sensation Sensation Light Touch: Appears Intact (Pt reports tingling in R leg below knee) Stereognosis: Appears Intact Hot/Cold: Appears Intact Proprioception: Appears Intact Coordination Gross Motor Movements are Fluid and Coordinated: No Fine Motor Movements are Fluid and Coordinated: Yes Coordination and Movement Description: Slow stiff movements Motor  Motor Motor: Within Functional Limits  Mobility Bed Mobility Bed Mobility: Rolling Right;Rolling Left;Sit to Supine;Supine to Sit Rolling Right: 5: Supervision Rolling Right Details: Verbal cues for precautions/safety Rolling Left: 5: Supervision Rolling  Left Details: Verbal cues for precautions/safety Supine to Sit: 5: Supervision Supine to Sit Details: Verbal cues for precautions/safety;Verbal cues for sequencing Sitting - Scoot to Edge of Bed: 5: Supervision Sit to Supine: 4: Min assist Sit to Supine - Details: Verbal cues for sequencing;Verbal cues for precautions/safety Sit to Supine - Details (indicate cue type and reason): assist for managing RLE into bed Transfers Transfers: Yes Sit to Stand: 5: Supervision Sit to Stand Details: Verbal cues for precautions/safety;Verbal cues for safe use of DME/AE Sit to Stand Details (indicate cue type and reason): Attempted 1X, right knee buckled Stand Pivot Transfers: 4: Min guard Locomotion  Ambulation Ambulation: Yes Ambulation/Gait Assistance: 4: Min guard Ambulation Distance (Feet): 120 Feet (2 bouts) Assistive device: Straight cane Gait Gait: Yes Gait Pattern: Antalgic Gait velocity: mildly decreased Stairs / Additional Locomotion Stairs: Yes Stairs Assistance: 4: Min guard (w/ SPC in L hand) Stair Management Technique: One rail Right Number of Stairs: 5  Trunk/Postural Assessment  Cervical Assessment Cervical Assessment: Within Functional Limits Thoracic Assessment Thoracic Assessment: Within Functional Limits Lumbar Assessment Lumbar Assessment: Exceptions to [facility] (Limited d/t precautions) Postural Control Postural Control: Within Functional Limits  Balance Balance Balance Assessed: Yes Static Sitting Balance Static Sitting - Balance Support: Feet supported Static Sitting - Level of Assistance: 6: Modified independent (Device/Increase time) Dynamic Sitting Balance Dynamic Sitting - Balance Support: Feet supported;During functional activity Dynamic Sitting - Level of Assistance: 5: Stand by assistance Dynamic Sitting - Balance Activities: Lateral lean/weight shifting;Forward lean/weight shifting Static Standing Balance Static Standing - Balance Support: During  functional activity;Left upper extremity supported Static Standing - Level of Assistance: 4: Min assist Dynamic Standing Balance Dynamic Standing - Balance Support: During functional activity;Left upper extremity supported Dynamic Standing - Level of Assistance: 4: Min assist Dynamic Standing - Balance Activities: Lateral lean/weight shifting;Forward lean/weight shifting Extremity  Assessment  RUE Assessment RUE Assessment: Exceptions to [facility] RUE Strength RUE Overall Strength: Deficits (4/5, grossly) LUE Assessment LUE Assessment: Within Functional Limits RLE Assessment RLE Assessment: Exceptions to [facility] RLE Strength RLE Overall Strength: Deficits RLE Overall Strength Comments: hip flexion 3/5, knee flexion/extension 2/5, DF/PF 4/5 LLE Assessment LLE Assessment: Within Functional Limits  FIM:  FIM - Locomotion: Ambulation Ambulation/Gait Assistance: 4: Min guard   Refer to Care Plan for Long Term Goals  Recommendations for other services: None  Discharge Criteria: Patient will be discharged from PT if patient refuses treatment 3 consecutive times without medical reason, if treatment goals not met, if there is a change in medical status, if patient makes no progress towards goals or if patient is discharged from hospital.  The above assessment, treatment plan, treatment alternatives and goals were discussed and mutually agreed upon: by patient  Rada Hay 11/30/2013, 9:50 AM

## 2013-11-30 NOTE — Progress Notes (Signed)
Cory Weber PHYSICAL MEDICINE & REHABILITATION     PROGRESS NOTE    Subjective/Complaints: Had a good night. Pain fairly well controlled. Anxious to start therapies today.  Objective: Vital Signs: Blood pressure 137/80, pulse 80, temperature 98.6 F (37 C), temperature source Oral, resp. rate 19, weight 99.202 kg (218 lb 11.2 oz), SpO2 98.00%. No results found.  Recent Labs  11/28/13 0645 11/29/13 0537  WBC 10.5 7.4  HGB 8.6* 8.9*  HCT 28.7* 29.0*  PLT 235 288    Recent Labs  11/28/13 0645 11/29/13 0537  NA 131* 130*  K 4.1 3.8  CL 93* 92*  GLUCOSE 171* 176*  BUN 15 11  CREATININE 1.07 0.94  CALCIUM 9.0 9.0   CBG (last 3)   Recent Labs  11/29/13 1629 11/29/13 2041 11/30/13 0648  GLUCAP 179* 86 108*    Wt Readings from Last 3 Encounters:  11/29/13 99.202 kg (218 lb 11.2 oz)  11/23/13 91.173 kg (201 lb)  11/23/13 91.173 kg (201 lb)    Physical Exam:  Constitutional: He is oriented to person, place, and time. He appears well-developed and well-nourished.  HENT:  Head: Normocephalic and atraumatic.  Eyes: Conjunctivae are normal. Pupils are equal, round, and reactive to light.  Neck: Normal range of motion. Neck supple.  Cardiovascular: Normal rate and regular rhythm. No murmur Respiratory: Effort normal and breath sounds normal. No respiratory distress. He has no wheezes.  GI: Soft. Bowel sounds are normal. He exhibits no distension. There is no tenderness.  Musculoskeletal: He exhibits no edema and no tenderness.  Neurological: He is alert and oriented to person, place, and time.  Follow commands without difficulty. Motor strength is 5/5 bilateral deltoid, biceps, triceps, grip  5/5 left hip flexor, knee extensor ankle dorsiflexor. RLE- 2 minus to 2/5 hip flexor right, 2/5 knee extensor right, 4/5 ankle dorsi and plantar flexor right  Sensation reduced in the right L3 dermatomal distribution  Skin: Skin is warm and dry. Incision intact with minimal  drainage. Fluctuant area noted to the left of incision  Assessment/Plan: 1. Functional deficits secondary to right L3/L4 Radiculopathy with motor-sensory deficits which require 3+ hours per day of interdisciplinary therapy in a comprehensive inpatient rehab setting. Physiatrist is providing close team supervision and 24 hour management of active medical problems listed below. Physiatrist and rehab team continue to assess barriers to discharge/monitor patient progress toward functional and medical goals. FIM:                   Comprehension Comprehension Mode: Auditory Comprehension: 7-Follows complex conversation/direction: With no assist  Expression Expression Mode: Verbal  Social Interaction Social Interaction Mode: Asleep  Problem Solving Problem Solving Mode: Asleep  Memory Memory Mode: Asleep  Medical Problem List and Plan:  1. Functional deficits secondary to Right L3 radiculopathy with sensory as well as motor deficits  2. DVT Prophylaxis/Anticoagulation: Pharmaceutical: Lovenox--added.  3. Pain Management: Discontinue IV morphine and use oxycodone on prn basis. Will add robaxin as needed for muscle pain.  4. Mood:  LCSW to follow for evaluation and support.  5. Neuropsych: This patient is capable of making decisions on his own behalf.  6. Skin/Wound Care: Routine pressure relief measures.  wound bid dressing changes to prevent maceration.   -clean/dry today 7. Fluids/Electrolytes/Nutrition: Monitor I/O for adequate hydration. Add nutritional supplement to help promote healing.  8. Acute on chronic iron deficiency anemia:   iron supplement.   9. FUO: ua neg, culture pending. Dopplers as above. Labs pending  10. Hyponatremia: Likely due to HCTZ as well as ongoing IVF. Will d/c fluids and recheck in am.  11. DM type 2: Will monitor BS with ac/hs checks. Was on glipizide and metformin at home. Hgb A1c- 6.5. Po intake is good. Discontinued lantus and resumed oral  medications. Continue to use SSI for elevated BS.   -observe for pattern today 12. HTN: Monitor every 8 hours. Continue Norvasc, Lisinopril and HCTZ.  86. H/o Hep C: Not on medications due to h/o cocaine abuse.  3. H/o gastric and duodenal ulcers: Continue protonix.  15. Acute on chronic Constipation:   lactulose bid for now. Continue linzess daily.   LOS (Days) 1 A FACE TO FACE EVALUATION WAS PERFORMED  Cory Weber T 11/30/2013 7:50 AM

## 2013-11-30 NOTE — Progress Notes (Signed)
Dressing changed 2 times in past 12 hours, moderate amount of serousanquinous drainage. Cory Weber A

## 2013-11-30 NOTE — Progress Notes (Signed)
Patient rates pain 5/10, abdomen,  states pain increases as pain medication wears off.

## 2013-11-30 NOTE — Progress Notes (Signed)
Social Work Assessment and Plan  Patient Details  Name: Cory Weber MRN: [id number] Date of Birth: [birth year]  Today's Date: 11/30/2013  Problem List:  Patient Active Problem List   Diagnosis Date Noted  . Radiculopathy of lumbar region 11/29/2013  . Fever presenting with conditions classified elsewhere 11/27/2013  . Acute right lumbar radiculopathy 11/26/2013  . S/P lumbar spinal fusion 11/25/2013  . Lumbar spinal stenosis 11/24/2013  . Right leg weakness 11/23/2013  . PUD (peptic ulcer disease) 11/19/2012  . IDA (iron deficiency anemia) 07/10/2012  . Chronic hepatitis C 05/13/2012  . Unspecified constipation 05/13/2012  . Peripheral vascular disease, unspecified 01/21/2012  . Hepatitis C antibody test positive 12/03/2011  . Normocytic anemia 12/03/2011  . Melena 12/03/2011  . DM (diabetes mellitus), type 2, uncontrolled 02/03/2011  . Accelerated hypertension 02/03/2011  . Hemiparesthesia 02/03/2011  . Polysubstance abuse 02/03/2011  . Alcohol abuse 02/03/2011  . Tobacco abuse 02/03/2011   Past Medical History:  Past Medical History  Diagnosis Date  . Diabetes mellitus   . Hypertension   . Hepatitis C     states he was diagnosed in 2007 or 2007 while living in [place], [place]  . CVA (cerebral infarction) 01/2011  . Hyperlipidemia   . Asthma   . PVD (peripheral vascular disease)   . Anemia   . Stroke   . FMBWGYKZ(993.5)    Past Surgical History:  Past Surgical History  Procedure Laterality Date  . Liver biopsy  2005    Done in [place], [place]. Chronic hepatitis with mild periportal inflammation, lobular unicellular necrosis and portal fibrosis. Grade 2, stage 1-2.  Marland Kitchen Colonoscopy with propofol N/A 09/03/2012    TSV:XBLTJQZ polyp-removed as outlined above. Prominent internal hemorrhoids. Tubular adenoma  . Esophagogastroduodenoscopy (egd) with propofol N/A 09/03/2012    ESP:QZRAQT hernia. Gastric diverticulum. Gastric ulcers with associated erosions. Duodenal  erosions. Status post gastric biopsy. H.PYLORI gastritis   . Esophageal biopsy N/A 09/03/2012    Procedure: BIOPSY;  Surgeon: Daneil Dolin, MD;  Location: [facility];  Service: Endoscopy;  Laterality: N/A;  gastric and gastric mucosa  . Polypectomy N/A 09/03/2012    Procedure: POLYPECTOMY;  Surgeon: Daneil Dolin, MD;  Location: [facility];  Service: Endoscopy;  Laterality: N/A;  cecal polyp  . Esophagogastroduodenoscopy (egd) with propofol N/A 12/03/2012    Procedure: ESOPHAGOGASTRODUODENOSCOPY (EGD) WITH PROPOFOL;  Surgeon: Daneil Dolin, MD;  Location: [facility];  Service: Endoscopy;  Laterality: N/A;  . Esophageal biopsy N/A 12/03/2012    Procedure: BIOPSY;  Surgeon: Daneil Dolin, MD;  Location: [facility];  Service: Endoscopy;  Laterality: N/A;   Social History:  reports that he has been smoking Cigarettes.  He has a 10 pack-year smoking history. He has never used smokeless tobacco. He reports that he drinks alcohol. He reports that he uses illicit drugs (Cocaine).  Family / Support Systems Marital Status: Single Patient Roles: Parent;Other (Comment) (brother, neighbor, friend) Children: dtr lives in [place], [place] Other Supports: Geannie Risen - sister in [place] 3 blocks from pt - [phone]      Reyan Helle - sister in [place] [phone] Anticipated Caregiver: none, goals are set for mod. ind. Recommended that his friends/family keep a close eye on pt initially after going home. Ability/Limitations of Caregiver: Pt has mod I goals and lives alone in his rental duplex Caregiver Availability: Intermittent Family Dynamics: sister is supportive.  dtr is coming to visit this weekend.  Social History Preferred language: English Religion: Christian Education: 1  year of community college Read: Yes Write: Yes Employment Status: Disabled Date Retired/Disabled/Unemployed: 4 months Legal History/Current Legal Issues: none reported Guardian/Conservator: N/A   Abuse/Neglect Physical Abuse:  Denies Verbal Abuse: Denies Sexual Abuse: Denies Exploitation of patient/patient's resources: Denies Self-Neglect: Denies  Emotional Status Pt's affect, behavior and adjustment status: Pt is motivated to get stronger and take advantage of the therapy he is receiving.  Appears to be coping well with recent surgery.  Feels he knew something wasn't right with his back for awhile, but was trying to compensate until it got better, except it only got worse and pt began falling.  Pt's faith helps him to cope. Recent Psychosocial Issues: Pt with 3 falls at home.  Was glad he wasn't out crossing the street.  Uses food pantries, as needed. Psychiatric History: None reported, but pt questioned if he needed an evaluation.  He could not tell CSW why he felt he may need an evaluation.  Pt denied any suicidal ideation. Substance Abuse History: Former alcohol and drug use.  Pt states this is a history and no current use/problems.  Patient / Family Perceptions, Expectations & Goals Pt/Family understanding of illness & functional limitations: Pt reports a good understanding of condition and limitations.  Has already contacted landlord to install railings for two stairs at his home. Premorbid pt/family roles/activities: Pt enjoys watching TV and keeping up with politics. Anticipated changes in roles/activities/participation: Pt stated that keeping up the house and himself has gotten to be more difficult.  He enjoys his independence,  but may need some help with household chores. Pt/family expectations/goals: Pt wants to get stronger and is glad to be getting therapy at [facility].  Community Resources [employer]: Other (Comment) (DSS, food pantries) Premorbid Home Care/DME Agencies: None Transportation available at discharge: friend or family Resource referrals recommended: Neuropsychology;Support group (specify)  Discharge Planning Living Arrangements: Alone Support Systems: Children;Other  relatives;Friends/neighbors Type of Residence: Private residence Insurance Resources: Medicaid (specify county) Lawyer) Financial Resources: SSD Financial Screen Referred: No Living Expenses: Education officer, community Management: Patient Does the patient have any problems obtaining your medications?: No (pt told RN he has trouble obtaining medications, but told CSW he can get them with his Medicaid and disability check.) Home Management: Pt has been doing this, but reports it has been more difficult with declining condition.  He wants PCS referral. Patient/Family Preliminary Plans: Pt plans to return to his duplex alone. Barriers to Discharge: Steps (landlord is installing railings for the stairs) Social Work Anticipated Follow Up Needs: HH/OP  Clinical Impression CSW met with pt to introduce self and role of CSW, as well as complete assessment.  Pt was open with CSW and appreciative of visit.  He has been managing at home alone and seems quite resourceful, getting himself food from the food pantries in town, receiving food stamps, and asking landlord to install ramps outside of his duplex.  Pt has a cane at home, but feels he will need a walker, tub bench, and bedside commode. He has never had home care.  Pt has family that can check on him or take him to appointments, but is otherwise at home alone and has been doing fine until he began falling due to his back.  Pt would like to be referred for [facility] and CSW will do this closer to d/c.  Pt seems to be coping well, but did mention that he may need a psychological evaluation.  He could not identify why, but stated he has no suicidal ideation.  CSW will try to refer pt to neuropsychologist.  No other needs identified at this time.  CSW will continue to follow and assist as needed.  Rimas Gilham, Silvestre Mesi 11/30/2013, 1:51 PM

## 2013-11-30 NOTE — Progress Notes (Signed)
Patient information reviewed and entered into eRehab system by Latavius Capizzi, RN, CRRN, PPS Coordinator.  Information including medical coding and functional independence measure will be reviewed and updated through discharge.    

## 2013-11-30 NOTE — Progress Notes (Signed)
Patient has c/o of pain across abdomen, aching that comes and goes. Patient rates pain 8/10, provided Ultram 50mg . Will continue to monitor.  Oval Linsey, RN

## 2013-11-30 NOTE — Progress Notes (Signed)
[facility] Individual Statement of Services  Patient Name:  Cory Weber  Date:  11/30/2013  Welcome to the [facility].  Our goal is to provide you with an individualized program based on your diagnosis and situation, designed to meet your specific needs.  With this comprehensive rehabilitation program, you will be expected to participate in at least 3 hours of rehabilitation therapies Monday-Friday, with modified therapy programming on the weekends.  Your rehabilitation program will include the following services:  Physical Therapy (PT), Occupational Therapy (OT), 24 hour per day rehabilitation nursing, Case Management (Social Worker), Rehabilitation Medicine, Nutrition Services and Pharmacy Services  Weekly team conferences will be held on Tuesdays to discuss your progress.  Your Social Worker will talk with you frequently to get your input and to update you on team discussions.  Team conferences with you and your family in attendance may also be held.  Expected length of stay:  5 to 7 days  Overall anticipated outcome:  Modified Independent  Depending on your progress and recovery, your program may change. Your Social Worker will coordinate services and will keep you informed of any changes. Your Social Worker's name and contact numbers are listed  below.  The following services may also be recommended but are not provided by the [facility] will be made to provide these services after discharge if needed.  Arrangements include referral to agencies that provide these services.  Your insurance has been verified to be:  Medicaid Your primary doctor is:  Dr. Iona Beard  Pertinent information will be shared with your doctor and your insurance company.  Social Worker:  Alfonse Alpers,  LCSW  [phone] or ([phone]  Information discussed with and copy given to patient by: Trey Sailors, 11/30/2013, 1:18 PM

## 2013-12-01 ENCOUNTER — Inpatient Hospital Stay (HOSPITAL_COMMUNITY): Payer: Medicaid Other | Admitting: Physical Therapy

## 2013-12-01 ENCOUNTER — Inpatient Hospital Stay (HOSPITAL_COMMUNITY): Payer: Medicaid Other | Admitting: Occupational Therapy

## 2013-12-01 DIAGNOSIS — D62 Acute posthemorrhagic anemia: Secondary | ICD-10-CM | POA: Diagnosis present

## 2013-12-01 DIAGNOSIS — K5900 Constipation, unspecified: Secondary | ICD-10-CM | POA: Diagnosis present

## 2013-12-01 DIAGNOSIS — I10 Essential (primary) hypertension: Secondary | ICD-10-CM

## 2013-12-01 LAB — GLUCOSE, CAPILLARY
GLUCOSE-CAPILLARY: 176 mg/dL — AB (ref 70–99)
Glucose-Capillary: 128 mg/dL — ABNORMAL HIGH (ref 70–99)
Glucose-Capillary: 137 mg/dL — ABNORMAL HIGH (ref 70–99)
Glucose-Capillary: 119 mg/dL — ABNORMAL HIGH (ref 70–99)

## 2013-12-01 NOTE — Progress Notes (Signed)
Physical Therapy Session Note  Patient Details  Name: Cory Weber MRN: [id number] Date of Birth: 30-Mar-1950  Today's Date: 12/01/2013 PT Individual Time: [id number] Tx 2: 1400-1500 PT Individual Time Calculation (min): 60 min Tx 2: 60 minutes  Short Term Goals: Week 1:  PT Short Term Goal 1 (Week 1): STG=LTG due to ELOS  Skilled Therapeutic Interventions/Progress Updates:    Therapeutic Activity: PT instructs pt in bed mobility in including log rolling x 5 reps from R side lie to back req SBA and verbal cues to avoid twisting.  PT instructs pt in R side lie to sit x 5 reps req verbal cues to avoid twisting low back, sepcifically to use momentum to increase coordination and get R LE onto bed.  PT instructs pt in w/c to/from bed transfer req CGA with SPC and verbal cues for safe w/c set up.   Gait Training: PT instructs pt in ambulation with SPC req CGA-min A x 135' + 120' and verbal cues for 2 point gait pattern and to avoid furniture walking. Pt makes it to 135' and then R leg buckles - pt is able to self correct and prevent fall, but reports his leg has gotten weaker since coming to the hospital and not using it. Pt agrees to do leg strengthening exercises.   Therapeutic Exercise: PT instructs pt in mat level strengthening exercises: clam shells x 20 reps, side lie hip abduction x 10 reps, R heel slides x 10 reps - to B LEs  Neuromuscular Reeducation: PT administers TUG and pt scores: 28.34 seconds, 32.11 seconds, and 28.11 seconds on each attempt.   Treatment Session 2: Gait Training: PT instructs pt in ambulation with RW x 150' x 2 reps req SBA for safety - pt stubbed his toes twice, but maintained balance without assist each time.  PT instructs pt on ascending/descending 5 stairs with B rails x 3 reps. On first step up, pt ascended with weak leg, which immediately gave out and PT provides mod A to assist pt in preventing a fall. PT educates pt to go up with the strong and down  with the weak leg. Pt req repeated verbal cues for this technique, and req continued reinforcement of this memory device to ensure he does stairs in the safest way possible.   Therapeutic Activity: Pt demonstrates R side lie to sit transfer mod I. PT instructs pt in sit to stand transfer req SBA and verbal cues for hand placment with RW.  Pt has to urinate and PT instructs pt in to/from toilet transfer with RW req SBA for safety.   Neuromuscular Reeducation: PT instructs pt in [place] A exercises: squats (2 x 10 reps), heel/toe raises x 10 reps, standing alternating hip abduction x 10 reps (CGA provided due to pt R knee buckling earlier in the session - no buckling noted this time), standing knee flexion x 10 reps (CGA provided), LAQ (AAROM on R LE with own hand) x 10 reps each.   Pt's balance with SPC is impaired - pt occasionally moves into a tandem stance when turning with SPC, which makes him unsteady. Pt's primary limitation in gait distance is R leg weakness/buckling from stroke about 2 years ago. PT is setting pt up on a B LE strengthening program in order to maximize safety with gait. Pt requests a w/c for home use, but PT anticipates pt will be a primary ambulator. Pt reports he has fallen probably about 5 times in the past year and that  it is usually when walking with a cane. PT notes that a SPC is not supportive enough for pt due to his residual R LE weakness from prior stroke. Pt will need a RW at d/c. Continue per PT POC.   Therapy Documentation Precautions:  Precautions Precautions: Fall;Back Precaution Comments: Pt able to name 2/3 precautions independently, 3/3 w/ mod cueing on PT eval Required Braces or Orthoses:  (Pt states per Dr. Ronnald Ramp, knee brace recommended?) Restrictions Weight Bearing Restrictions: No Pain: Pain Assessment Pain Assessment: 0-10 Pain Score: 5  Pain Type: Surgical pain Pain Location: Back Pain Orientation: Medial Pain Descriptors / Indicators:  Aching Pain Onset: On-going Pain Intervention(s): Rest Multiple Pain Sites: No Treatment Session 2: Pt c/o 9/10 low back pain; PT notifies RN.   Balance: Balance Balance Assessed: Yes Standardized Balance Assessment Standardized Balance Assessment: Timed Up and Go Test Timed Up and Go Test TUG: Normal TUG Normal TUG (seconds): 28  See FIM for current functional status  Therapy/Group: Individual Therapy  Shakeema Lippman M 12/01/2013, 9:40 AM

## 2013-12-01 NOTE — Progress Notes (Signed)
Occupational Therapy Session Note  Patient Details  Name: Cory Weber MRN: [id number] Date of Birth: 09/24/1950  Today's Date: 12/01/2013 OT Individual Time: 0800-0900 OT Individual Time Calculation (min): 60 min   Short Term Goals: Week 1:  OT Short Term Goal 1 (Week 1): STG=LTG due to anticipated brief LOS  Skilled Therapeutic Interventions/Progress Updates:  Patient resting in bed upon arrival.  Engaged in self care retraining to include sponge bath dress, groom at sink, toilet transfers and introduce the therapy kitchen.  Patient declined to perform bath at sink and preferred to perform bath EOB with lateral leans to bathe buttocks.  Patient encouraged to stand to pull up pants secondary to he was reluctant to stand due to, "my leg is weak".  Reviewed need to stand and ambulated when therapy working with patient to get right leg stronger and he agreed to include ambulate to sink and stand to brush teeth then ambulate to sit on toilet.  This OT placed 3 in 1 over commode to assist with sit><stand and encouraged patient to ambulate to bathroom or have staff take hiim with w/c since he will not use bed pan or BSC at home and he agreed.  Patient propelled w/c to and from therapy kitchen to be introduced to the plan to address simple meal/snack prep prior to discharge.  Patient reports that he feels he will need a w/c some when he first goes home and encouraged patient to discuss this with his PT.  Patient required mod-max cues to adhere to back precautions and was able to recall 1/3.  Provided written reminders in 3 places in his room which include a handout.  Therapy Documentation Precautions:  Precautions Precautions: Fall;Back Precaution Comments: Pt able to name 2/3 precautions independently, 3/3 w/ mod cueing on PT eval Required Braces or Orthoses:  (Pt states per Dr. Ronnald Ramp, knee brace recommended?) Restrictions Weight Bearing Restrictions: No Pain: Pain Assessment Pain Assessment:  0-10 Pain Score: 8  Pain Type: Surgical pain Pain Location: Back Pain Onset: On-going Pain Intervention(s): RN aware and medication received, rest, repositioned. ADL: See FIM for current functional status  Therapy/Group: Individual Therapy  Joan Herschberger, [employer] 12/01/2013, 8:11 AM

## 2013-12-01 NOTE — Progress Notes (Signed)
[facility] PHYSICAL MEDICINE & REHABILITATION     PROGRESS NOTE    Subjective/Complaints: Overall doing well. Has some grabbing pain along left sider about 3-4 inches above waistline.   Objective: Vital Signs: Blood pressure 125/75, pulse 74, temperature 98.3 F (36.8 C), temperature source Oral, resp. rate 16, weight 99.202 kg (218 lb 11.2 oz), SpO2 98.00%. No results found.  Recent Labs  11/29/13 0537 11/30/13 0735  WBC 7.4 7.5  HGB 8.9* 8.7*  HCT 29.0* 28.3*  PLT 288 346    Recent Labs  11/29/13 0537 11/30/13 0735  NA 130* 134*  K 3.8 4.0  CL 92* 95*  GLUCOSE 176* 160*  BUN 11 11  CREATININE 0.94 0.99  CALCIUM 9.0 9.3   CBG (last 3)   Recent Labs  11/30/13 1643 11/30/13 2108 12/01/13 0641  GLUCAP 122* 113* 128*    Wt Readings from Last 3 Encounters:  11/29/13 99.202 kg (218 lb 11.2 oz)  11/23/13 91.173 kg (201 lb)  11/23/13 91.173 kg (201 lb)    Physical Exam:  Constitutional: He is oriented to person, place, and time. He appears well-developed and well-nourished.  HENT:  Head: Normocephalic and atraumatic.  Eyes: Conjunctivae are normal. Pupils are equal, round, and reactive to light.  Neck: Normal range of motion. Neck supple.  Cardiovascular: Normal rate and regular rhythm. No murmur Respiratory: Effort normal and breath sounds normal. No respiratory distress. He has no wheezes.  GI: Soft. Bowel sounds are normal. He exhibits no distension. There is no tenderness.  Musculoskeletal: He exhibits no edema and no tenderness.  Neurological: He is alert and oriented to person, place, and time.  Follow commands without difficulty. Motor strength is 5/5 bilateral deltoid, biceps, triceps, grip  5/5 left hip flexor, knee extensor ankle dorsiflexor. RLE- 2 minus to 2/5 hip flexor right, 2/5 knee extensor right, 4/5 ankle dorsi and plantar flexor right  Sensation reduced in the right L3 dermatomal distribution  Skin: Skin is warm and dry. Incision intact  with minimal drainage. Fluctuant area noted to the left of incision  Assessment/Plan: 1. Functional deficits secondary to right L3/L4 Radiculopathy with motor-sensory deficits which require 3+ hours per day of interdisciplinary therapy in a comprehensive inpatient rehab setting. Physiatrist is providing close team supervision and 24 hour management of active medical problems listed below. Physiatrist and rehab team continue to assess barriers to discharge/monitor patient progress toward functional and medical goals. FIM: FIM - Bathing Bathing Steps Patient Completed: Chest;Right Arm;Left Arm;Abdomen;Front perineal area;Right upper leg;Left upper leg;Buttocks (pan bath at edge of bed) Bathing: 4: Min-Patient completes 8-9 29f 10 parts or 75+ percent  FIM - Upper Body Dressing/Undressing Upper body dressing/undressing steps patient completed: Thread/unthread right sleeve of pullover shirt/dresss;Thread/unthread left sleeve of pullover shirt/dress;Put head through opening of pull over shirt/dress;Pull shirt over trunk Upper body dressing/undressing: 5: Set-up assist to: Obtain clothing/put away FIM - Lower Body Dressing/Undressing Lower body dressing/undressing steps patient completed: Pull pants up/down Lower body dressing/undressing: 2: Max-Patient completed 25-49% of tasks  FIM - Toileting Toileting steps completed by patient: Adjust clothing prior to toileting;Performs perineal hygiene;Adjust clothing after toileting Toileting: 6: More than reasonable amount of time  FIM - Radio producer Devices: Bedside commode Toilet Transfers: 5-To toilet/BSC: Supervision (verbal cues/safety issues);5-From toilet/BSC: Supervision (verbal cues/safety issues)  FIM - Bed/Chair Transfer Bed/Chair Transfer: 5: Supine > Sit: Supervision (verbal cues/safety issues);5: Sit > Supine: Supervision (verbal cues/safety issues);4: Bed > Chair or W/C: Min A (steadying Pt. > 75%);4:  Chair  or W/C > Bed: Min A (steadying Pt. > 75%)  FIM - Locomotion: Wheelchair Locomotion: Wheelchair: 5: Travels 150 ft or more: maneuvers on rugs and over door sills with supervision, cueing or coaxing FIM - Locomotion: Ambulation Ambulation/Gait Assistance: 4: Min guard;3: Mod assist Locomotion: Ambulation: 3: Travels 150 ft or more with moderate assistance (Pt: 50 - 74%)  Comprehension Comprehension Mode: Auditory Comprehension: 7-Follows complex conversation/direction: With no assist  Expression Expression Mode: Verbal Expression: 5-Expresses complex 90% of the time/cues < 10% of the time  Social Interaction Social Interaction Mode: Asleep Social Interaction: 6-Interacts appropriately with others with medication or extra time (anti-anxiety, antidepressant).  Problem Solving Problem Solving Mode: Asleep Problem Solving: 5-Solves complex 90% of the time/cues < 10% of the time  Memory Memory Mode: Asleep Memory: 7-Complete Independence: No helper  Medical Problem List and Plan:  1. Functional deficits secondary to Right L3 radiculopathy with sensory as well as motor deficits  2. DVT Prophylaxis/Anticoagulation: Pharmaceutical: Lovenox--added.  3. Pain Management: Discontinue IV morphine and use oxycodone on prn basis. Will add robaxin as needed for muscle pain.   -i suspect some of left flank discomfort is muscle spasm. Could be referred pain from back as well 4. Mood:  LCSW to follow for evaluation and support.  5. Neuropsych: This patient is capable of making decisions on his own behalf.  6. Skin/Wound Care: Routine pressure relief measures.  wound bid dressing changes to prevent maceration.   -clean/dry today  -he may shower as long as wound is covered 7. Fluids/Electrolytes/Nutrition: Monitor I/O for adequate hydration. Add nutritional supplement to help promote healing.  8. Acute on chronic iron deficiency anemia:   iron supplement.   9. FUO: ua neg, culture negative, labwork  unremarkable.  10. Hyponatremia: Likely due to HCTZ---improved 11. DM type 2: Will monitor BS with ac/hs checks. Was on glipizide and metformin at home. Hgb A1c- 6.5. Po intake is good. Discontinued lantus and resumed oral medications. Continue to use SSI for elevated BS.   -improving pattern 12. HTN: Monitor every 8 hours. Continue Norvasc, Lisinopril and HCTZ.  44. H/o Hep C: Not on medications due to h/o cocaine abuse.  61. H/o gastric and duodenal ulcers: Continue protonix.  15. Acute on chronic Constipation:   lactulose bid for now. Continue linzess daily.   LOS (Days) 2 A FACE TO FACE EVALUATION WAS PERFORMED  Cory Weber T 12/01/2013 7:42 AM

## 2013-12-02 ENCOUNTER — Inpatient Hospital Stay (HOSPITAL_COMMUNITY): Payer: Medicaid Other

## 2013-12-02 ENCOUNTER — Inpatient Hospital Stay (HOSPITAL_COMMUNITY): Payer: Medicaid Other | Admitting: *Deleted

## 2013-12-02 ENCOUNTER — Inpatient Hospital Stay (HOSPITAL_COMMUNITY): Payer: Medicaid Other | Admitting: Physical Therapy

## 2013-12-02 ENCOUNTER — Inpatient Hospital Stay (HOSPITAL_COMMUNITY): Payer: Medicaid Other | Admitting: Occupational Therapy

## 2013-12-02 DIAGNOSIS — D62 Acute posthemorrhagic anemia: Secondary | ICD-10-CM

## 2013-12-02 LAB — GLUCOSE, CAPILLARY
Glucose-Capillary: 141 mg/dL — ABNORMAL HIGH (ref 70–99)
Glucose-Capillary: 142 mg/dL — ABNORMAL HIGH (ref 70–99)
Glucose-Capillary: 134 mg/dL — ABNORMAL HIGH (ref 70–99)
Glucose-Capillary: 122 mg/dL — ABNORMAL HIGH (ref 70–99)

## 2013-12-02 NOTE — Plan of Care (Signed)
Problem: RH PAIN MANAGEMENT Goal: RH STG PAIN MANAGED AT OR BELOW PT'S PAIN GOAL < 4  Outcome: Progressing PRN oxy IR, robaxin, and k-pad effective

## 2013-12-02 NOTE — Progress Notes (Signed)
Cory Weber PHYSICAL MEDICINE & REHABILITATION     PROGRESS NOTE    Subjective/Complaints: Had a better night. kpad very helpful for left flank pain.   Objective: Vital Signs: Blood pressure 149/74, pulse 71, temperature 98.2 F (36.8 C), temperature source Oral, resp. rate 16, weight 99.202 kg (218 lb 11.2 oz), SpO2 97.00%. No results found.  Recent Labs  11/30/13 0735  WBC 7.5  HGB 8.7*  HCT 28.3*  PLT 346    Recent Labs  11/30/13 0735  NA 134*  K 4.0  CL 95*  GLUCOSE 160*  BUN 11  CREATININE 0.99  CALCIUM 9.3   CBG (last 3)   Recent Labs  12/01/13 1640 12/01/13 2109 12/02/13 0711  GLUCAP 119* 176* 141*    Wt Readings from Last 3 Encounters:  11/29/13 99.202 kg (218 lb 11.2 oz)  11/23/13 91.173 kg (201 lb)  11/23/13 91.173 kg (201 lb)    Physical Exam:  Constitutional: He is oriented to person, place, and time. He appears well-developed and well-nourished.  HENT:  Head: Normocephalic and atraumatic.  Eyes: Conjunctivae are normal. Pupils are equal, round, and reactive to light.  Neck: Normal range of motion. Neck supple.  Cardiovascular: Normal rate and regular rhythm. No murmur Respiratory: Effort normal and breath sounds normal. No respiratory distress. He has no wheezes.  GI: Soft. Bowel sounds are normal. He exhibits no distension. There is no tenderness.  Musculoskeletal: He exhibits no edema and no tenderness.  Neurological: He is alert and oriented to person, place, and time.  Follow commands without difficulty. Motor strength is 5/5 bilateral deltoid, biceps, triceps, grip  5/5 left hip flexor, knee extensor ankle dorsiflexor. RLE- 2 minus to 2/5 hip flexor right, 2/5 knee extensor right, 4/5 ankle dorsi and plantar flexor right  Sensation reduced in the right L3 dermatomal distribution  Skin: Skin is warm and dry. Incision intact with mild sero-sanginous drainage.    Assessment/Plan: 1. Functional deficits secondary to right L3/L4  Radiculopathy with motor-sensory deficits which require 3+ hours per day of interdisciplinary therapy in a comprehensive inpatient rehab setting. Physiatrist is providing close team supervision and 24 hour management of active medical problems listed below. Physiatrist and rehab team continue to assess barriers to discharge/monitor patient progress toward functional and medical goals. FIM: FIM - Bathing Bathing Steps Patient Completed: Chest;Right Arm;Left Arm;Abdomen;Front perineal area;Right upper leg;Left upper leg;Buttocks (pan bath at edge of bed) Bathing: 4: Min-Patient completes 8-9 69f 10 parts or 75+ percent  FIM - Upper Body Dressing/Undressing Upper body dressing/undressing steps patient completed: Thread/unthread right sleeve of pullover shirt/dresss;Thread/unthread left sleeve of pullover shirt/dress;Put head through opening of pull over shirt/dress;Pull shirt over trunk Upper body dressing/undressing: 5: Set-up assist to: Obtain clothing/put away FIM - Lower Body Dressing/Undressing Lower body dressing/undressing steps patient completed: Pull pants up/down Lower body dressing/undressing: 2: Max-Patient completed 25-49% of tasks  FIM - Toileting Toileting steps completed by patient: Adjust clothing prior to toileting;Performs perineal hygiene;Adjust clothing after toileting Toileting: 6: Assistive device: No helper  FIM - Radio producer Devices: Oncologist Transfers: 5-To toilet/BSC: Supervision (verbal cues/safety issues);5-From toilet/BSC: Supervision (verbal cues/safety issues)  FIM - Control and instrumentation engineer Devices: Cane;Arm rests Bed/Chair Transfer: 5: Supine > Sit: Supervision (verbal cues/safety issues);5: Sit > Supine: Supervision (verbal cues/safety issues);4: Bed > Chair or W/C: Min A (steadying Pt. > 75%);4: Chair or W/C > Bed: Min A (steadying Pt. > 75%)  FIM - Locomotion: Wheelchair Locomotion: Wheelchair: 0:  Activity  did not occur FIM - Locomotion: Ambulation Locomotion: Ambulation Assistive Devices: Journalist, newspaper Ambulation/Gait Assistance: 4: Min assist Locomotion: Ambulation: 2: Travels 50 - 149 ft with minimal assistance (Pt.>75%)  Comprehension Comprehension Mode: Auditory Comprehension: 5-Understands complex 90% of the time/Cues < 10% of the time  Expression Expression Mode: Verbal Expression: 5-Expresses complex 90% of the time/cues < 10% of the time  Social Interaction Social Interaction Mode: Asleep Social Interaction: 6-Interacts appropriately with others with medication or extra time (anti-anxiety, antidepressant).  Problem Solving Problem Solving Mode: Asleep Problem Solving: 5-Solves complex 90% of the time/cues < 10% of the time  Memory Memory Mode: Asleep Memory: 4-Recognizes or recalls 75 - 89% of the time/requires cueing 10 - 24% of the time  Medical Problem List and Plan:  1. Functional deficits secondary to Right L3 radiculopathy with sensory as well as motor deficits  2. DVT Prophylaxis/Anticoagulation: Pharmaceutical: Lovenox--added.  3. Pain Management: Discontinue IV morphine and use oxycodone on prn basis. Will add robaxin as needed for muscle pain.   -i suspect some of left flank discomfort is muscle spasm. Could be referred pain from back as well 4. Mood:  LCSW to follow for evaluation and support.  5. Neuropsych: This patient is capable of making decisions on his own behalf.  6. Skin/Wound Care: Routine pressure relief measures.  wound bid dressing changes to prevent maceration.   -wound slight drainage still  -he may shower as long as wound is covered 7. Fluids/Electrolytes/Nutrition: Monitor I/O for adequate hydration. Add nutritional supplement to help promote healing.  8. Acute on chronic iron deficiency anemia:   iron supplement.   9. FUO: ua neg, culture negative, labwork unremarkable.  10. Hyponatremia: Likely due to HCTZ---improved 11. DM type  2: Will monitor BS with ac/hs checks. Was on glipizide and metformin at home. Hgb A1c- 6.5. Po intake is good. Discontinued lantus and resumed oral medications. Continue to use SSI for elevated BS.   -improving pattern 12. HTN: Monitor every 8 hours. Continue Norvasc, Lisinopril and HCTZ.  67. H/o Hep C: Not on medications due to h/o cocaine abuse.  52. H/o gastric and duodenal ulcers: Continue protonix.  15. Acute on chronic Constipation:   lactulose bid for now. Continue linzess daily.   LOS (Days) 3 A FACE TO FACE EVALUATION WAS PERFORMED  Karey Suthers T 12/02/2013 8:10 AM

## 2013-12-02 NOTE — Plan of Care (Signed)
Problem: RH SKIN INTEGRITY Goal: RH STG ABLE TO PERFORM INCISION/WOUND CARE W/ASSISTANCE STG Able To Perform Incision/Wound Care With Total Assistance from caregiver.  Outcome: Progressing Staff changes dressing to incision due to location on lower back impossible for pt to reach.

## 2013-12-02 NOTE — Discharge Instructions (Signed)
Inpatient Rehab Discharge Instructions  Cory Weber Discharge date and time: 12/04/13   Activities/Precautions/ Functional Status: Activity: activity as tolerated-No driving till cleared by MD.  Diet: diabetic diet Wound Care: keep wound clean and dry Functional status:  ___ No restrictions     ___ Walk up steps independently ___ 24/7 supervision/assistance   ___ Walk up steps with assistance ___ Intermittent supervision/assistance  ___ Bathe/dress independently ___ Walk with walker     ___ Bathe/dress with assistance ___ Walk Independently    ___ Shower independently ___ Walk with assistance    ___ Shower with assistance _X__ No alcohol     ___ Return to work/school ________  COMMUNITY REFERRALS UPON DISCHARGE:   Home Health:   PT     RN  Agency:  [facility]       Phone: [phone] Medical Equipment/Items Ordered:  Rolling walker, bedside commode, and tub bench  Agency/Supplier:  [employer]       Phone:  [phone]  Special Instructions:    My questions have been answered and I understand these instructions. I will adhere to these goals and the provided educational materials after my discharge from the hospital.  Patient/Caregiver Signature _______________________________ Date __________  Clinician Signature _______________________________________ Date __________  Please bring this form and your medication list with you to all your follow-up doctor's appointments.

## 2013-12-02 NOTE — Progress Notes (Signed)
Physical Therapy Session Note  Patient Details  Name: Cory Weber MRN: [id number] Date of Birth: [birth year]  Today's Date: 12/02/2013 PT Individual Time: 1300-1400 PT Individual Time Calculation (min): 60 min   Short Term Goals: Week 1:  PT Short Term Goal 1 (Week 1): STG=LTG due to ELOS  Skilled Therapeutic Interventions/Progress Updates:    Gait Training: PT instructs pt in ambulation with RW req SBA x 150' x 3 reps with verbal cues for upright posture.  PT instructs pt in stairs with B rails. Pt is able to remember "up with the strong, down with the weak". On 5 steps, pt req CGA-SBA and verbal cues when performing stairs in a step-to pattern x 6 reps total. PT instructs pt in how to place RW at top of 2 steps and at bottom of 2 steps with verbal cues, so that he will be able to get walker in and out of his home. First 2 reps req CGA-SBA with verbal cues and B rails, 2nd two reps req CBA-SBA with placing walker at top/bottom of stairs and using B rails, 3rd two reps PT did not give verbal cues and pt completed walker up/down stairs with B rails with SBA for safety.   Orthotic Fit/Management: PT trials Walk On Trimmable R AFO with pt and a trail solid polypropylene AFO to see if either type will control pt's mild R knee hyperextension in stance phase, but neither are able to control it. PT explains that pt will not benefit from an orthotic to improve his knee stability.   Therapeutic Activity: PT instructs pt in bed mobility for supine to sit via log rolling after pt begins to get out of bed breaking precautions. PT hands pt the reacher and instructs him to put his shoes on and he begins to bend down to do so. PT explains very clearly that breaking his back precautions will cause further instability in his back and could lead to further damage of his spinal cord. Pt nods in understanding.   Pt needs continued reinforcement on safety with stairs. Pt says he will talk to his landlord after  therapy today and make sure that the handrails are in place in time for his planned discharge on Saturday. Continue per PT POC.   Therapy Documentation Precautions:  Precautions Precautions: Fall;Back Precaution Comments: Pt able to name 2/3 precautions independently, 3/3 w/ mod cueing on PT eval Required Braces or Orthoses:  (Pt states per Dr. Ronnald Ramp, knee brace recommended?) Restrictions Weight Bearing Restrictions: No Pain: Pain Assessment Pain Assessment: 0-10 Pain Score: 8  Pain Type: Surgical pain Pain Location: Back Pain Orientation: Lower Pain Descriptors / Indicators: Aching Pain Frequency: Constant Pain Onset: On-going Patients Stated Pain Goal: 2 Pain Intervention(s): Rest;RN made aware Multiple Pain Sites: No  See FIM for current functional status  Therapy/Group: Individual Therapy  Sibel Khurana M 12/02/2013, 1:06 PM

## 2013-12-02 NOTE — Progress Notes (Signed)
Note reviewed and accurately reflects treatment session.   

## 2013-12-02 NOTE — IPOC Note (Signed)
Overall Plan of Care [facility]) Patient Details Name: Cory Weber MRN: [id number] DOB: [birth year]  Admitting Diagnosis: s p L3 4  PLIF  Hospital Problems: Principal Problem:   Radiculopathy of lumbar region Active Problems:   DM (diabetes mellitus), type 2, uncontrolled   Polysubstance abuse   Right leg weakness   Acute blood loss anemia   Constipation     Functional Problem List: Nursing Bowel;Motor;Pain;Skin Integrity;Medication Management  PT Balance;Motor;Pain;Other (comment) (strength, gait)  OT Safety;Balance  SLP    TR         Basic ADL's: OT Bathing;Dressing;Toileting     Advanced  ADL's: OT Simple Meal Preparation     Transfers: PT Bed Mobility;Bed to Chair;Car;Furniture;Floor  OT Toilet;Tub/Shower     Locomotion: PT Ambulation     Additional Impairments: OT None  SLP        TR      Anticipated Outcomes Item Anticipated Outcome  Self Feeding Independent  Swallowing      Basic self-care  Mod I  Toileting  Mod I   Bathroom Transfers Mod I  Bowel/Bladder  Mod I  Transfers  mod (I)  Locomotion  mod (I)  Communication     Cognition     Pain  < 4  Safety/Judgment  Mod I   Therapy Plan: PT Intensity: Minimum of 1-2 x/day ,45 to 90 minutes PT Frequency: 5 out of 7 days PT Duration Estimated Length of Stay: 5-7 days OT Intensity: Minimum of 1-2 x/day, 45 to 90 minutes OT Frequency: 5 out of 7 days OT Duration/Estimated Length of Stay: 5-7 days         Team Interventions: Nursing Interventions Patient/Family Education;Bowel Management;Pain Management;Medication Management;Disease Management/Prevention;Skin Care/Wound Management  PT interventions Ambulation/gait training;Balance/vestibular training;Community reintegration;Discharge planning;DME/adaptive equipment instruction;Neuromuscular re-education;Functional mobility training;Patient/family education;Stair training;Therapeutic Exercise;Therapeutic Activities;UE/LE Strength  taining/ROM;UE/LE Coordination activities  OT Interventions Therapeutic Exercise;Therapeutic Activities;Self Care/advanced ADL retraining;Patient/family education;Functional mobility training;DME/adaptive equipment instruction;Discharge planning;Balance/vestibular training  SLP Interventions    TR Interventions    SW/CM Interventions Discharge Planning;Psychosocial Support;Patient/Family Education    Team Discharge Planning: Destination: PT-Home ,OT- Home , SLP-  Projected Follow-up: PT-Outpatient PT, OT-  Home health OT, SLP-  Projected Equipment Needs: PT-Cane, OT- To be determined, SLP-  Equipment Details: PT-pt owns [address], OT-  Patient/family involved in discharge planning: PT- Patient,  OT-Patient, SLP-   MD ELOS: 5-7 days Medical Rehab Prognosis:  Excellent Assessment: The patient has been admitted for CIR therapies with the diagnosis of lumbar radiculopathy s/p PLIF. The team will be addressing functional mobility, strength, stamina, balance, safety, adaptive techniques and equipment, self-care, bowel and bladder mgt, patient and caregiver education, pain mgt, safety awareness, back precautions, wound care. Goals have been set at mod I for mobility and selfcare.    Meredith Staggers, MD, FAAPMR      See Team Conference Notes for weekly updates to the plan of care

## 2013-12-02 NOTE — Progress Notes (Signed)
Physical Therapy Session Note  Patient Details  Name: Cory Weber MRN: [id number] Date of Birth: 18-Jul-1950  Today's Date: 12/02/2013 PT Individual Time: 1103-1133 PT Individual Time Calculation (min): 30 min   Short Term Goals: Week 1:  PT Short Term Goal 1 (Week 1): STG=LTG due to ELOS  Skilled Therapeutic Interventions/Progress Updates:    Session focused on gait with RW to/from therapy gym with overall close S with cues for posture, speed, and positioning of RW for safety, dynamic gait through obstacles to simulate home environment (navigating turns and stepping over thresholds) with cues for safe placement of feet and RW when navigating threshold as well as cues to stay inside RW when turning, and Nustep for LE strengthening (LE's only) on level 5 x 8 min. Pt experienced 1 episode of R knee buckling requiring min A to recover during gait activities.   Therapy Documentation Precautions:  Precautions Precautions: Fall;Back Precaution Comments: Pt able to name 2/3 precautions independently, 3/3 w/ mod cueing on PT eval Required Braces or Orthoses:  (Pt states per Dr. Ronnald Ramp, knee brace recommended?) Restrictions Weight Bearing Restrictions: No Pain: Pain in back - RN notified for pain medication but not due yet. Locomotion : Ambulation Ambulation/Gait Assistance: 5: Supervision;4: Min guard   See FIM for current functional status  Therapy/Group: Individual Therapy  [name] [facility] 12/02/2013, 12:07 PM

## 2013-12-02 NOTE — Patient Care Conference (Addendum)
Inpatient RehabilitationTeam Conference and Plan of Care Update Date: 11/30/2013   Time: 2:10 PM    Patient Name: Cory Weber      Medical Record Number: [id number]  Date of Birth: 1950/10/27 Sex: Male         Room/Bed: 4M06C/4M06C-01 Payor Info: Payor: MEDICAID [place] / Plan: MEDICAID [place] ACCESS / Product Type: *No Product type* /    Admitting Diagnosis: s p L3 4  PLIF  Admit Date/Time:  11/29/2013  3:47 PM Admission Comments: No comment available   Primary Diagnosis:  Radiculopathy of lumbar region Principal Problem: Radiculopathy of lumbar region  Patient Active Problem List   Diagnosis Date Noted  . Acute blood loss anemia 12/01/2013  . Constipation 12/01/2013  . Radiculopathy of lumbar region 11/29/2013  . Fever presenting with conditions classified elsewhere 11/27/2013  . Acute right lumbar radiculopathy 11/26/2013  . S/P lumbar spinal fusion 11/25/2013  . Lumbar spinal stenosis 11/24/2013  . Right leg weakness 11/23/2013  . PUD (peptic ulcer disease) 11/19/2012  . IDA (iron deficiency anemia) 07/10/2012  . Chronic hepatitis C 05/13/2012  . Unspecified constipation 05/13/2012  . Peripheral vascular disease, unspecified 01/21/2012  . Hepatitis C antibody test positive 12/03/2011  . Normocytic anemia 12/03/2011  . Melena 12/03/2011  . DM (diabetes mellitus), type 2, uncontrolled 02/03/2011  . Accelerated hypertension 02/03/2011  . Hemiparesthesia 02/03/2011  . Polysubstance abuse 02/03/2011  . Tobacco abuse 02/03/2011    Expected Discharge Date: Expected Discharge Date: 12/04/13  Team Members Present: Physician leading conference: Dr. Alger Simons Social Worker Present: Alfonse Alpers, LCSW Nurse Present: Elliot Cousin, RN PT Present: Raylene Everts, Lorriane Shire, PT OT Present: Salome Spotted, OT;Patricia Lissa Hoard, OT SLP Present: Weston Anna, SLP Other (Discipline and Name): Danne Baxter, RN [facility]) PPS Coordinator present : Daiva Nakayama, RN, CRRN   Current Status/Progress Goal Weekly Team Focus  Medical   lumbar radiculopathy, decompression, pain controlled  improve functional mobility, safety  pain, wound care   Bowel/Bladder   Continent of bowel and bladder; LBM 10/21  Mod I  Continue current regimen; monitor and treat for consitpation prn   Swallow/Nutrition/ Hydration             ADL's   Overall Min-Mod for BADL, supervision for transfers  Mod I for BADL and transfers  AE instruction, dynamic sitting/standing balance, transfer training   Mobility   Supervision  Mod I  following spinal precautions with mobility, dynamic standing balance, stair training   Communication             Safety/Cognition/ Behavioral Observations  Patient was observed ambulating self back to bed from bathroom today without assistance; re educated patient on safety plan  Supervision  Hourly round   Pain   C/o pain in lower back; surgical site- controlled with po pain meds  < 4  Assess and treat for pain q shift and prn   Skin   Incision to lower back intact with dermabond; draining moderate amount of serosanguinous fluid; dressing changes BID  To remain free from further breakdown or infection with mod I assist  Assess skin q shift and prn; monitor incision for changes in drainage    Rehab Goals Patient on target to meet rehab goals: Yes Rehab Goals Revised: None *See Care Plan and progress notes for long and short-term goals.  Barriers to Discharge: none id'd    Possible Resolutions to Barriers:  mod I goals    Discharge Planning/Teaching Needs:  Pt plans to return to  his duplex on his own.  He can call his sister, as needed.  Pt can manage and direct his own care.   Team Discussion:  Pt with chronic back issues, but his incision looks good.  Pt with hx of drug abuse, with no current use.  Pain is controlled per MD.  Pt is moving around great and OT would like to try bathing soon.  Revisions to Treatment Plan:  None   Continued Need for  Acute Rehabilitation Level of Care: The patient requires daily medical management by a physician with specialized training in physical medicine and rehabilitation for the following conditions: Daily direction of a multidisciplinary physical rehabilitation program to ensure safe treatment while eliciting the highest outcome that is of practical value to the patient.: Yes Daily medical management of patient stability for increased activity during participation in an intensive rehabilitation regime.: Yes Daily analysis of laboratory values and/or radiology reports with any subsequent need for medication adjustment of medical intervention for : Post surgical problems;Neurological problems  [name] 12/03/2013, 12:21 PM

## 2013-12-02 NOTE — Progress Notes (Signed)
Social Work Patient ID: Cory Weber, male   DOB: February 04, 1951, 63 y.o.   MRN: [id number]  CSW met with pt to update him on team conference discussion and targeted d/c date of 12-04-13 after therapies.  Pt was glad to know when he was going home, but then told OT today that he didn't have a bed at home, so did not feel ready to go home.  CSW reached out to a couple of community agencies to try to locate a bed for pt and hopefully something will come through for pt.  CSW also spoke with pt's sister today to answer any questions.  Pt does not have Medicaid copay for medications, but will at the first of the month.  CSW asked if pt could borrow from family until he gets his check.  Pt plans to ask his dtr and he thinks she will be able to help.  CSW arranged home care and DME for pt.  Pt was pleased CSW was trying to obtain a bed for him.

## 2013-12-02 NOTE — Progress Notes (Signed)
Occupational Therapy Session Note  Patient Details  Name: Cory Weber MRN: [id number] Date of Birth: 1950/10/03  Today's Date: 12/02/2013 OT Individual Time: 0830-0930 OT Individual Time Calculation (min): 60 min    Short Term Goals: Week 1:  OT Short Term Goal 1 (Week 1): STG=LTG due to anticipated brief LOS  Skilled Therapeutic Interventions/Progress Updates: ADL-retraining with emphasis on safety awareness, adherence to back precautions, and discharge planning.   Pt received supine in bed with RN attending and pt resting on K-pad d/t moderate low back pain (8/10).   OT re-educated pt on back precautions and on benefit of shower, informing pt of MD approval to shower provided wound is well covered.   Pt agreed to shower using shower chair for safety.  OT applied shower barrier over wound and pt ambulated to bathroom, first to use toilet and then to shower.   Pt required repeated instruction to not "twist" during BADL and to use LH sponge and reacher as needed throughout entire session.    Pt dressed at edge of bed while discussing discharge plan.   Pt clarified that he sleeps on a low couch and owns only a rocking recliner.   OT advised pt on need to raise furniture using platform or cinder blocks with assist from friends/family to fabricate platform, move and lift furniture.    Pt appreciates use of BSC and tub bench; OT relayed request for equipment to SW at end of session.      Therapy Documentation Precautions:  Precautions Precautions: Fall;Back Precaution Comments: Pt able to name 2/3 precautions independently, 3/3 w/ mod cueing on PT eval Required Braces or Orthoses:  (Pt states per Dr. Ronnald Ramp, knee brace recommended?) Restrictions Weight Bearing Restrictions: No  Pain: Pain Assessment Pain Assessment: 0-10 Pain Score: 8  Pain Type: Surgical pain Pain Location: Back Pain Orientation: Lower Pain Descriptors / Indicators: Aching Pain Frequency: Intermittent Pain Onset:  On-going Patients Stated Pain Goal: 2 Pain Intervention(s): Medication (See eMAR);Heat applied  ADL: ADL ADL Comments: see FIM  See FIM for current functional status  Therapy/Group: Individual Therapy  [place] 12/02/2013, 9:40 AM

## 2013-12-02 NOTE — Progress Notes (Signed)
Recreational Therapy Session Note  Patient Details  Name: Cory Weber MRN: [id number] Date of Birth: 1950/06/26 Today's Date: 12/02/2013  Pain: no c/o Skilled Therapeutic Interventions/Progress Updates: Order received & chart reviewed.  Met with pt & discussed leisure interests & community pursuits.  Discussed safe use of leisure time & community pursuits post discharge, pt stated understanding.  Pt with discharge date of 10/24, therefore no formal TR treatment plan implemented.  Will continue to monitor through team. Wassim Kirksey 12/02/2013, 4:07 PM

## 2013-12-02 NOTE — Discharge Summary (Signed)
Physician Discharge Summary  Patient ID: Cory Weber MRN: [id number] DOB/AGE: 20-Oct-1950 63 y.o.  Admit date: 11/29/2013 Discharge date: 12/04/2013  Discharge Diagnoses:  Principal Problem:   Radiculopathy of lumbar region Active Problems:   DM (diabetes mellitus), type 2, uncontrolled   Polysubstance abuse   Right leg weakness   Acute blood loss anemia   Constipation   Discharged Condition: Stable.   Labs:  Basic Metabolic Panel:  Recent Labs Lab 11/30/13 0735  NA 134*  K 4.0  CL 95*  CO2 29  GLUCOSE 160*  BUN 11  CREATININE 0.99  CALCIUM 9.3    CBC: CBC Latest Ref Rng 11/30/2013 11/29/2013 11/28/2013  WBC 4.0 - 10.5 K/uL 7.5 7.4 10.5  Hemoglobin 13.0 - 17.0 g/dL 8.7(L) 8.9(L) 8.6(L)  Hematocrit 39.0 - 52.0 % 28.3(L) 29.0(L) 28.7(L)  Platelets 150 - 400 K/uL 346 288 235     CBG:  Recent Labs Lab 12/03/13 1204 12/03/13 1636 12/03/13 2035 12/04/13 0712 12/04/13 1137  GLUCAP 173* 131* 116* 138* 169*    Brief HPI:   Cory Weber is a 63 y.o. male with history of DM type 2 with peripheral neuropathy, Hep C, CVA with residual RLE weakness, spondylolisthesis at L3/4 with a previous disc rupture with a superior free fragment at L3-4 on the right 2 years ago. He was admitted via [facility] on 11/24/13 with three day history of falls with worsening of RLE weakness and difficulty walking. Right foot X rays with nondisplaced fracture of proximal 5 th MT without dislocation. He was noted to be anemic with Hgb 7.7 and was transfused with 2 units PRBC for acute on chronic anemia. MRI spine done revealing spondylolisthesis with L3-4 spinal stenosis and an inferior free fragment compressing the right L4 nerve root and surgical decompression recommended by Dr. Ronnald Ramp. He was taken to OR for decompressive lam L3-L4 with PLIF on 11/25/13.  Post op continues to have RLE weakness with numbness as well as with problems with mobility. He has had fevers in evenings and CT  abd/pelvis without acute findings. CXR negative. Blood culture X 2 10/17--negative so far. He was started on IV Vanc/zosyn empirically and antibiotics d/c today. CIR recommended by MD and therapy team and patient admitted today.    Hospital Course: Cory Weber was admitted to rehab 11/29/2013 for inpatient therapies to consist of PT and OT at least three hours five days a week. Past admission physiatrist, therapy team and rehab RN have worked together to provide customized collaborative inpatient rehab. Blood pressures have been monitored on tid basis and have been reasonably controlled on Norvasc, lisinopril and HCTZ. He has been afebrile off antibiotics. He has continued to have serosanguinous drainage from wound. No signs of infection noted and incision is intact. Dressing changes were done on bid basis to avoid maceration. Follow up labs revealed that ABLA is stable and WBC is stable. Check of lytes show that hyponatremia is resolving. Pain control has improved with addition of robaxin. Diabetes was monitored with ac/hs cbg checks and lantus was discontinued. Oral medications were resumed as po intake has been good and blood sugars show occasional elevation. He is to follow up with PMD for input on resuming glipizide.  He has made steady progress during his rehab stay and is independent at discharge. [facility] to continue to provide HHPT as well as [facility] for wound care monitoring past discharge.   Rehab course: During patient's stay in rehab weekly team conferences were held to  monitor patient's progress, set goals and discuss barriers to discharge.Patient has had improvement in activity tolerance, balance, postural control, as well as ability to compensate for deficits. He is able to complete bathing and dressing tasks independently. He is independent for transfers and is able to ambulate 200 feet with RW independently. He requires supervision for safety with stairs. Family education was done  with daughter who assist with transition to home.    Disposition:  Home  Diet:  Diabetic.   Special Instructions: 1. Continue back precautions. No driving till cleared by MD.  2.  No alcohol.     Medication List    STOP taking these medications       glipiZIDE 2.5 mg Tabs tablet  Commonly known as:  GLUCOTROL      TAKE these medications       amLODipine 5 MG tablet  Commonly known as:  NORVASC  Take 1 tablet (5 mg total) by mouth daily.     aspirin 81 MG tablet  Take 81 mg by mouth daily.     esomeprazole 40 MG capsule  Commonly known as:  NEXIUM  TAKE ONE CAPSULE BY MOUTH DAILY BEFORE BREAKFAST.     ferrous fumarate 325 (106 FE) MG Tabs tablet  Commonly known as:  HEMOCYTE - 106 mg FE  Take 1 tablet (106 mg of iron total) by mouth 2 (two) times daily.     gabapentin 300 MG capsule  Commonly known as:  NEURONTIN  Take 1 capsule (300 mg total) by mouth 2 (two) times daily.     Linaclotide 290 MCG Caps capsule  Commonly known as:  LINZESS  Take 1 capsule (290 mcg total) by mouth daily. 30 minutes before breakfast.     lisinopril-hydrochlorothiazide 20-12.5 MG per tablet  Commonly known as:  PRINZIDE,ZESTORETIC  Take 1 tablet by mouth 2 (two) times daily.     metFORMIN 1000 MG tablet  Commonly known as:  GLUCOPHAGE  Take 1 tablet (1,000 mg total) by mouth 2 (two) times daily with a meal.     methocarbamol 500 MG tablet  Commonly known as:  ROBAXIN  Take 1 tablet (500 mg total) by mouth every 6 (six) hours as needed for muscle spasms.     nicotine 14 mg/24hr patch  Commonly known as:  NICODERM CQ - dosed in mg/24 hours  14 mg patch daily x2 weeks then 7 mg patch daily x3 weeks and stop     oxyCODONE 5 MG immediate release tablet--Rx # 90 pills   Commonly known as:  Oxy IR/ROXICODONE  Take 1-2 tablets (5-10 mg total) by mouth every 4 (four) hours as needed for severe pain.     simvastatin 20 MG tablet  Commonly known as:  ZOCOR  Take 1 tablet (20 mg  total) by mouth daily at 6 PM.       Follow-up Information   Follow up with Meredith Staggers, MD On 12/31/2013. (Be there at  11:20  for 11:40 am  appointment )    Specialty:  Physical Medicine and Rehabilitation   Contact information:   [address], [place] [address] [phone]       Follow up with Eustace Moore, MD. Call today. (for follow up appointment in two weeks. )    Specialty:  Neurosurgery   Contact information:   [place] [address] [phone]       Follow up with Maggie Font, MD On 12/22/2013. (@  10:30 AM)    Specialty:  Family Medicine   Contact information:   [address] [place] [address] [phone]       Signed: Bary Leriche 12/06/2013, 11:22 AM

## 2013-12-02 NOTE — Progress Notes (Signed)
Occupational Therapy Session Note  Patient Details  Name: Cory Weber MRN: [id number] Date of Birth: 01/27/1951  Today's Date: 12/02/2013 OT Individual Time: 1030-1100 OT Individual Calculated Time (min): 30 min    Short Term Goals: Week 1:  OT Short Term Goal 1 (Week 1): STG=LTG due to anticipated brief LOS  Skilled Therapeutic Interventions/Progress Updates:  Skilled OT session focused on IADL retraining, dynamic standing balance, activity tolerance/endurance, safety awareness w/ RW, and discharge planning.  Pt supine in bed when arriving, reporting 7/10 pain and had received morning medication. Pt t/f to EOB and then ambulated to rehab apartment w/ RW. Pt educated on and completed TTB t/f. Pt ambulated to rehab kitchen and completed simple meal prep activity of making toaster waffles. Pt educated on safety w/ RW, energy conservation, and maintaining back precautions when retrieving objects from high and low counters and refrigerator. Pt ambulated to room w/ RW and seated on EOB waiting for PT w/ all needs nearby when leaving.  Therapy Documentation Precautions:  Precautions Precautions: Fall;Back Precaution Comments: Pt able to name 2/3 precautions independently, 3/3 w/ mod cueing on PT eval Required Braces or Orthoses:  (Pt states per Dr. Ronnald Ramp, knee brace recommended?) Restrictions Weight Bearing Restrictions: No  See FIM for current functional status  Therapy/Group: Individual Therapy  [name] 12/02/2013, 12:17 PM

## 2013-12-03 ENCOUNTER — Inpatient Hospital Stay (HOSPITAL_COMMUNITY): Payer: Medicaid Other

## 2013-12-03 ENCOUNTER — Inpatient Hospital Stay (HOSPITAL_COMMUNITY): Payer: Medicaid Other | Admitting: Physical Therapy

## 2013-12-03 LAB — GLUCOSE, CAPILLARY
GLUCOSE-CAPILLARY: 173 mg/dL — AB (ref 70–99)
Glucose-Capillary: 136 mg/dL — ABNORMAL HIGH (ref 70–99)
Glucose-Capillary: 131 mg/dL — ABNORMAL HIGH (ref 70–99)
Glucose-Capillary: 116 mg/dL — ABNORMAL HIGH (ref 70–99)

## 2013-12-03 LAB — CULTURE, BLOOD (ROUTINE X 2)
Culture: NO GROWTH
Culture: NO GROWTH

## 2013-12-03 MED ORDER — ESOMEPRAZOLE MAGNESIUM 40 MG PO CPDR: DELAYED_RELEASE_CAPSULE | ORAL | Status: DC

## 2013-12-03 MED ORDER — OXYCODONE HCL 5 MG PO TABS: 5.0000 mg | ORAL_TABLET | ORAL | Status: DC | PRN

## 2013-12-03 MED ORDER — FERROUS FUMARATE 325 (106 FE) MG PO TABS: 1.0000 | ORAL_TABLET | Freq: Two times a day (BID) | ORAL | Status: DC

## 2013-12-03 MED ORDER — LINACLOTIDE 290 MCG PO CAPS: 290.0000 ug | ORAL_CAPSULE | Freq: Every day | ORAL | Status: DC

## 2013-12-03 MED ORDER — NICOTINE 14 MG/24HR TD PT24: MEDICATED_PATCH | TRANSDERMAL | Status: DC

## 2013-12-03 MED ORDER — METFORMIN HCL 1000 MG PO TABS: 1000.0000 mg | ORAL_TABLET | Freq: Two times a day (BID) | ORAL | Status: DC

## 2013-12-03 MED ORDER — SIMVASTATIN 20 MG PO TABS: 20.0000 mg | ORAL_TABLET | Freq: Every day | ORAL | Status: DC

## 2013-12-03 MED ORDER — LISINOPRIL-HYDROCHLOROTHIAZIDE 20-12.5 MG PO TABS: 1.0000 | ORAL_TABLET | Freq: Two times a day (BID) | ORAL | Status: DC

## 2013-12-03 MED ORDER — METHOCARBAMOL 500 MG PO TABS: 500.0000 mg | ORAL_TABLET | Freq: Four times a day (QID) | ORAL | Status: DC | PRN

## 2013-12-03 MED ORDER — GABAPENTIN 300 MG PO CAPS: 300.0000 mg | ORAL_CAPSULE | Freq: Two times a day (BID) | ORAL | Status: DC

## 2013-12-03 MED ORDER — AMLODIPINE BESYLATE 5 MG PO TABS: 5.0000 mg | ORAL_TABLET | Freq: Every day | ORAL | Status: DC

## 2013-12-03 NOTE — Progress Notes (Signed)
Physical Therapy Discharge Summary  Patient Details  Name: Cory Weber MRN: [id number] Date of Birth: 1950-03-07  Today's Date: 12/03/2013 PT Individual Time: 1030-1130 PT Individual Time Calculation (min): 60 min    Patient has met 7 of 9 long term goals due to improved activity tolerance, improved balance, improved postural control, decreased pain, ability to compensate for deficits and improved coordination.  Patient to discharge at an ambulatory level Modified Independent.   Patient's daughterr is independent to provide the necessary  assistance at discharge for the first few days, then pt will be home alone.  Reasons goals not met: Pt continues to req cues for safety when ascending/descending stairs.   Recommendation:  Patient will benefit from ongoing skilled PT services in outpatient setting to continue to advance safe functional mobility, address ongoing impairments in standing balance, residual R LE weakness from prior CVA, safety with stairs, generalized conditioning, activity tolerance, and minimize fall risk.  Equipment: RW with flip up tray  Reasons for discharge: discharge from hospital  Patient/family agrees with progress made and goals achieved: Yes  Therapeutic Interventions Community Integration: PT instructs pt in ambulation with RW in the community setting including: safe walker use when entering/exiting the elevator, avoiding twisting and bending back when mashing elevator buttons, awareness of width of RW when in small spaces such as the hospital gift shop aisles, techniques to enter/exit a bathroom with RW in order to manage the heavy door (pt puts all of his weight on his R LE, which is weak side from prior CVA that has demonstrated buckling during this hospital stay). Pt verbalizes understanding.   Therapeutic Activity: PT instructs pt in floor recovery technique from mat on floor to plinth table x 3 reps. On first and second rep, pt req verbal cues and SBA  for easiest technique and how to follow spinal precautions. On 3rd rep, pt demonstrates understanding and completes task mod I.  Pt demonstrates mod I car transfer with RW.   Pt is at a grossly mod I level with RW. Pt has all DME needs for safe d/c. Daughter is available for a few days to assist pt at home. Pt to receive continued PT in the outpatient setting. Pt is safe to d/c home.     PT Discharge Precautions/Restrictions Precautions Precautions: Fall;Back Restrictions Weight Bearing Restrictions: No Pain Pain Assessment Pain Assessment: 0-10 Pain Score: 5  Pain Type: Surgical pain Pain Location: Back Pain Orientation: Medial;Posterior Pain Descriptors / Indicators: Aching Pain Frequency: Constant Pain Onset: On-going Pain Intervention(s): Rest Multiple Pain Sites: No Vision/Perception  Perception Comments: wfl  Cognition Overall Cognitive Status: Within Functional Limits for tasks assessed Arousal/Alertness: Awake/alert Orientation Level: Oriented X4 Attention: Focused;Sustained Focused Attention: Appears intact Sustained Attention: Appears intact Alternating Attention: Appears intact Memory: Appears intact Awareness: Appears intact Problem Solving: Appears intact Safety/Judgment: Appears intact Sensation Sensation Stereognosis: Not tested Hot/Cold: Not tested Proprioception: Appears Intact Coordination Gross Motor Movements are Fluid and Coordinated: Yes Fine Motor Movements are Fluid and Coordinated: Not tested Motor  Motor Motor: Within Functional Limits Motor - Discharge Observations: limited only by spinal precautions  Mobility Bed Mobility Bed Mobility: Rolling Right;Rolling Left;Supine to Sit;Sit to Supine Rolling Right: 6: Modified independent (Device/Increase time) Rolling Left: 6: Modified independent (Device/Increase time) Supine to Sit: 6: Modified independent (Device/Increase time) Sit to Supine: 6: Modified independent (Device/Increase  time) Transfers Transfers: Yes Sit to Stand: 6: Modified independent (Device/Increase time) Stand Pivot Transfers: 6: Modified independent (Device/Increase time) Locomotion  Ambulation Ambulation: Yes  Ambulation/Gait Assistance: 6: Modified independent (Device/Increase time) Ambulation Distance (Feet): 200 Feet Assistive device: Rolling walker Gait Gait: Yes Gait Pattern: Impaired Gait Pattern: Trunk flexed Stairs / Additional Locomotion Stairs: Yes Stairs Assistance: 5: Supervision Stairs Assistance Details: Verbal cues for technique;Verbal cues for precautions/safety Stair Management Technique: Two rails (RW up/down 2 stairs prior to using B rails) Number of Stairs: 2 Height of Stairs: 6.5 Wheelchair Mobility Wheelchair Mobility: No  Trunk/Postural Assessment  Cervical Assessment Cervical Assessment: Within Functional Limits Thoracic Assessment Thoracic Assessment: Within Functional Limits Lumbar Assessment Lumbar Assessment: Exceptions to [facility] Lumbar AROM Overall Lumbar AROM: Deficits;Due to precautions Overall Lumbar AROM Comments: spinal Postural Control Postural Control: Within Functional Limits  Balance Balance Balance Assessed: Yes Static Sitting Balance Static Sitting - Level of Assistance: 7: Independent Dynamic Sitting Balance Dynamic Sitting - Level of Assistance: 7: Independent Dynamic Sitting - Balance Activities: Lateral lean/weight shifting Static Standing Balance Static Standing - Balance Support: Bilateral upper extremity supported;During functional activity Static Standing - Level of Assistance: 6: Modified independent (Device/Increase time) Dynamic Standing Balance Dynamic Standing - Balance Support: Bilateral upper extremity supported;During functional activity Dynamic Standing - Level of Assistance: 6: Modified independent (Device/Increase time)  See FIM for current functional status  Jouri Threat M 12/03/2013, 11:19 AM

## 2013-12-03 NOTE — Progress Notes (Signed)
Occupational Therapy Session Note  Patient Details  Name: Cory Weber MRN: [id number] Date of Birth: [birth year]  Today's Date: 12/03/2013 OT Individual Time: 1000-1030 OT Individual Time Calculation (min): 30 min    Short Term Goals: Week 1:  OT Short Term Goal 1 (Week 1): STG=LTG due to anticipated brief LOS  Skilled Therapeutic Interventions/Progress Updates: ADL-retraining with focus on family ed (dtr Cory Weber, sister present as well) on pt's performance in self care, mobility, and adherence to current back precautions.   Writer presented late for session and shower was deferred however pt agreed to additional time at end of day as scheduled with writer to shower and receive instruction on UE strengthening HEP.      Pt then completed review of back precautions (able to recall 2 of 3, forgetting "no twisting"), and progressed with bed mobility, ambulated to bathroom with RW and completed toilet transfer and toileting with distant monitoring for safety.   Daughter verbalized understanding of back precautions and provided oversight of pt's mobility and transfers.   Pt then ambulated from his room to tub room using RW with OT and dtr observing and he performed tub bench transfer in/out of tub without incident.   OT educated dtr on equipment used and recommended during admission and methods of adjusting equipment properly with reiteration for pt to not attempt bending or twisting by trying to place or adjust bench or commode by himself.    OT then provided demo walker tray and educated pt and dtr on method of transporting items in kitchen using tray or similar device to reduce risk of fall.    Pt returned to his room at end of session with family present.    Therapy Documentation Precautions:  Precautions Precautions: Fall;Back Precaution Comments: Pt able to name 2/3 precautions independently, 3/3 w/ mod cueing on PT eval Required Braces or Orthoses:  (Pt states per Dr. Ronnald Ramp, knee brace  recommended?) Restrictions Weight Bearing Restrictions: No  General: General OT Amount of Missed Time: 30 Minutes  Pain: Pain Assessment Pain Assessment: 0-10 Pain Score: 0-No pain Pain Type: Surgical pain Pain Location: Back Pain Frequency: Constant Pain Onset: On-going Pain Intervention(s): Medication (See eMAR)  ADL: ADL ADL Comments: see FIM  See FIM for current functional status  Therapy/Group: Individual Therapy  Second session: Time: [id number] Time Calculation (min):  90 min  Pain Assessment: 5/10, low back  Skilled Therapeutic Interventions: ADL-retraining (60 min) with focus on family ed (dtr Cory Weber), AE use (reacher, sock aid, shoe horn, LH sponge), and adherence to back precautions during BADL.   Pt completed bathing/dressing at overall mod I functionally although he continues to break back precautions during unexpected events (dropping items within reach).   Pt impulsively reacts to activities and/or incidents by twisting to look over either shoulder and by reaching below his knees.   With dtr present, OT demo'd alternate method of bending, flexing at hip while maintaining spinal alignment.     Pt is able to use reacher, LH sponge, sock aid and shoe horn when provided and as designed but has not yet acquired needed items.    Following B&D, pt was educated on 6 upper body exercises (30 min) using thera-band grade 3 and 4 to improve UE strength required to perform transfers and mobility using RW.   Pt demo'd competence with exercises after correction on technique for each exercise: Chest press, seated row, scapular chest pull, scapular pull down, bicep curl, tricep push down.    Pt performed  exercises seated unsupported at edge of bed with dtr observing.   Pt left at edge of bed with dtr in room, all needs within reach.  See FIM for current functional status  Therapy/Group: Individual Therapy  Ayeshia Coppin 12/03/2013, 10:53 AM

## 2013-12-03 NOTE — Plan of Care (Signed)
Problem: RH Ambulation Goal: LTG Patient will ambulate in community environment (PT) LTG: Patient will ambulate in community environment, # of feet with assistance (PT).  Outcome: Not Met (add Reason) Pt req supervision for safety in a community environment.   Problem: RH Stairs Goal: LTG Patient will ambulate up and down stairs w/assist (PT) LTG: Patient will ambulate up and down # of stairs with assistance (PT)  Outcome: Not Met (add Reason) Pt req supervision for verbal cues to manage RW up/down 2 stairs with B rails.

## 2013-12-03 NOTE — Progress Notes (Signed)
[place] PHYSICAL MEDICINE & REHABILITATION     PROGRESS NOTE    Subjective/Complaints: Feeling fairly well. Pain better controlled   Objective: Vital Signs: Blood pressure 139/76, pulse 72, temperature 98.7 F (37.1 C), temperature source Oral, resp. rate 18, weight 99.202 kg (218 lb 11.2 oz), SpO2 100.00%. No results found. No results found for this basename: WBC, HGB, HCT, PLT,  in the last 72 hours No results found for this basename: NA, K, CL, CO, GLUCOSE, BUN, CREATININE, CALCIUM,  in the last 72 hours CBG (last 3)   Recent Labs  12/02/13 1655 12/02/13 2139 12/03/13 0713  GLUCAP 134* 122* 136*    Wt Readings from Last 3 Encounters:  11/29/13 99.202 kg (218 lb 11.2 oz)  11/23/13 91.173 kg (201 lb)  11/23/13 91.173 kg (201 lb)    Physical Exam:  Constitutional: He is oriented to person, place, and time. He appears well-developed and well-nourished.  HENT:  Head: Normocephalic and atraumatic.  Eyes: Conjunctivae are normal. Pupils are equal, round, and reactive to light.  Neck: Normal range of motion. Neck supple.  Cardiovascular: Normal rate and regular rhythm. No murmur Respiratory: Effort normal and breath sounds normal. No respiratory distress. He has no wheezes.  GI: Soft. Bowel sounds are normal. He exhibits no distension. There is no tenderness.  Musculoskeletal: He exhibits no edema and no tenderness.  Neurological: He is alert and oriented to person, place, and time.  Follow commands without difficulty. Motor strength is 5/5 bilateral deltoid, biceps, triceps, grip  5/5 left hip flexor, knee extensor ankle dorsiflexor. RLE-   2/5 hip flexor right, 2+/5 knee extensor right, 4/5 ankle dorsi and plantar flexor right  Sensation reduced in the right L3 dermatomal distribution  Skin: Skin is warm and dry. Incision intact with mild sanginous drainage along inferior aspec    Assessment/Plan: 1. Functional deficits secondary to right L3/L4 Radiculopathy with  motor-sensory deficits which require 3+ hours per day of interdisciplinary therapy in a comprehensive inpatient rehab setting. Physiatrist is providing close team supervision and 24 hour management of active medical problems listed below. Physiatrist and rehab team continue to assess barriers to discharge/monitor patient progress toward functional and medical goals.  Home tomorrow with [facility] therapy and RN.   FIM: FIM - Bathing Bathing Steps Patient Completed: Chest;Right Arm;Left Arm;Abdomen;Front perineal area;Buttocks;Right upper leg;Left upper leg;Right lower leg (including foot);Left lower leg (including foot) Bathing: 5: Supervision: Safety issues/verbal cues  FIM - Upper Body Dressing/Undressing Upper body dressing/undressing steps patient completed: Thread/unthread right sleeve of pullover shirt/dresss;Put head through opening of pull over shirt/dress;Thread/unthread left sleeve of pullover shirt/dress;Pull shirt over trunk Upper body dressing/undressing: 5: Supervision: Safety issues/verbal cues FIM - Lower Body Dressing/Undressing Lower body dressing/undressing steps patient completed: Thread/unthread right pants leg;Thread/unthread left pants leg;Pull pants up/down;Don/Doff right shoe;Don/Doff left shoe Lower body dressing/undressing: 5: Supervision: Safety issues/verbal cues  FIM - Toileting Toileting steps completed by patient: Adjust clothing prior to toileting;Performs perineal hygiene;Adjust clothing after toileting Toileting: 7: Independent: No helper, no device  FIM - Radio producer Devices: Mining engineer Transfers: 5-To toilet/BSC: Supervision (verbal cues/safety issues);5-From toilet/BSC: Supervision (verbal cues/safety issues)  FIM - Control and instrumentation engineer Devices: Walker;Arm rests Bed/Chair Transfer: 5: Supine > Sit: Supervision (verbal cues/safety issues);5: Sit > Supine: Supervision (verbal cues/safety  issues);5: Bed > Chair or W/C: Supervision (verbal cues/safety issues);5: Chair or W/C > Bed: Supervision (verbal cues/safety issues)  FIM - Locomotion: Wheelchair Locomotion: Wheelchair: 0: Activity did not occur FIM -  Locomotion: Ambulation Locomotion: Ambulation Assistive Devices: Administrator Ambulation/Gait Assistance: 5: Supervision Locomotion: Ambulation: 5: Travels 150 ft or more with supervision/safety issues  Comprehension Comprehension Mode: Auditory Comprehension: 5-Understands complex 90% of the time/Cues < 10% of the time  Expression Expression Mode: Verbal Expression: 5-Expresses complex 90% of the time/cues < 10% of the time  Social Interaction Social Interaction Mode: Asleep Social Interaction: 6-Interacts appropriately with others with medication or extra time (anti-anxiety, antidepressant).  Problem Solving Problem Solving Mode: Asleep Problem Solving: 5-Solves complex 90% of the time/cues < 10% of the time  Memory Memory Mode: Asleep Memory: 4-Recognizes or recalls 75 - 89% of the time/requires cueing 10 - 24% of the time  Medical Problem List and Plan:  1. Functional deficits secondary to Right L3 radiculopathy with sensory as well as motor deficits  2. DVT Prophylaxis/Anticoagulation: Pharmaceutical: Lovenox--added.  3. Pain Management: Discontinue IV morphine and use oxycodone on prn basis. Will add robaxin as needed for muscle pain.   -muscle spasm component 4. Mood:  LCSW to follow for evaluation and support.  5. Neuropsych: This patient is capable of making decisions on his own behalf.  6. Skin/Wound Care: Routine pressure relief measures.  wound bid dressing changes to prevent maceration.   -wound drainage decreasing  -he may shower as long as wound is covered 7. Fluids/Electrolytes/Nutrition: Monitor I/O for adequate hydration. Add nutritional supplement to help promote healing.  8. Acute on chronic iron deficiency anemia:   iron supplement.    9. FUO: ua neg, culture negative, labwork unremarkable.  10. Hyponatremia: Likely due to HCTZ---improved 11. DM type 2: Will monitor BS with ac/hs checks. Was on glipizide and metformin at home. Hgb A1c- 6.5. Po intake is good. Discontinued lantus and resumed oral medications. Continue to use SSI for elevated BS.   -improved control 12. HTN: Monitor every 8 hours. Continue Norvasc, Lisinopril and HCTZ.  10. H/o Hep C: Not on medications due to h/o cocaine abuse.  77. H/o gastric and duodenal ulcers: Continue protonix.  15. Acute on chronic Constipation:   lactulose bid for now. Continue linzess daily.   LOS (Days) 4 A FACE TO FACE EVALUATION WAS PERFORMED  Cory Weber 12/03/2013 8:26 AM

## 2013-12-04 LAB — GLUCOSE, CAPILLARY
GLUCOSE-CAPILLARY: 138 mg/dL — AB (ref 70–99)
Glucose-Capillary: 169 mg/dL — ABNORMAL HIGH (ref 70–99)

## 2013-12-04 NOTE — Progress Notes (Signed)
12/04/13 1309 nsg Patient discharged to home per wheelchair accompanied by NT and daughter. Per daughter they have all the paper works and understood discharge instructions. No other questions.

## 2013-12-04 NOTE — Progress Notes (Signed)
[place] PHYSICAL MEDICINE & REHABILITATION     PROGRESS NOTE    Subjective/Complaints: Happy to be going home today!! No complaints.    Objective: Vital Signs: Blood pressure 138/71, pulse 64, temperature 98.5 F (36.9 C), temperature source Axillary, resp. rate 20, weight 99.202 kg (218 lb 11.2 oz), SpO2 100.00%. No results found. No results found for this basename: WBC, HGB, HCT, PLT,  in the last 72 hours No results found for this basename: NA, K, CL, CO, GLUCOSE, BUN, CREATININE, CALCIUM,  in the last 72 hours CBG (last 3)   Recent Labs  12/03/13 1636 12/03/13 2035 12/04/13 0712  GLUCAP 131* 116* 138*    Wt Readings from Last 3 Encounters:  11/29/13 99.202 kg (218 lb 11.2 oz)  11/23/13 91.173 kg (201 lb)  11/23/13 91.173 kg (201 lb)    Physical Exam:  Constitutional: He is oriented to person, place, and time. He appears well-developed and well-nourished.  HENT:  Head: Normocephalic and atraumatic.  Eyes: Conjunctivae are normal. Pupils are equal, round, and reactive to light.  Neck: Normal range of motion. Neck supple.  Cardiovascular: Normal rate and regular rhythm. No murmur Respiratory: Effort normal and breath sounds normal. No respiratory distress. He has no wheezes.  GI: Soft. Bowel sounds are normal. He exhibits no distension. There is no tenderness.  Musculoskeletal: He exhibits no edema and no tenderness.  Neurological: He is alert and oriented to person, place, and time.  Follow commands without difficulty. Motor strength is 5/5 bilateral deltoid, biceps, triceps, grip  5/5 left hip flexor, knee extensor ankle dorsiflexor. RLE-   2/5 hip flexor right, 2+/5 knee extensor right, 4/5 ankle dorsi and plantar flexor right  Sensation reduced in the right L3 dermatomal distribution  Skin: Skin is warm and dry. Incision intact with mild sanginous drainage along inferior aspect--decreasing    Assessment/Plan: 1. Functional deficits secondary to right L3/L4  Radiculopathy with motor-sensory deficits which require 3+ hours per day of interdisciplinary therapy in a comprehensive inpatient rehab setting. Physiatrist is providing close team supervision and 24 hour management of active medical problems listed below. Physiatrist and rehab team continue to assess barriers to discharge/monitor patient progress toward functional and medical goals.  Home today with [facility] therapy and RN. F/u with NS/PMR   FIM: FIM - Bathing Bathing Steps Patient Completed: Chest;Right Arm;Left Arm;Abdomen;Front perineal area;Buttocks;Right upper leg;Left upper leg;Right lower leg (including foot);Left lower leg (including foot) Bathing: 6: Assistive device (Comment) (LH sponge)  FIM - Upper Body Dressing/Undressing Upper body dressing/undressing steps patient completed: Thread/unthread right sleeve of pullover shirt/dresss;Thread/unthread left sleeve of pullover shirt/dress;Put head through opening of pull over shirt/dress;Pull shirt over trunk Upper body dressing/undressing: 7: Complete Independence: No helper FIM - Lower Body Dressing/Undressing Lower body dressing/undressing steps patient completed: Thread/unthread right pants leg;Thread/unthread left pants leg;Pull pants up/down;Don/Doff right sock;Don/Doff left sock;Don/Doff right shoe;Don/Doff left shoe Lower body dressing/undressing: 6: Assistive device (Comment) (reacher, sock aid, shoe horn)  FIM - Toileting Toileting steps completed by patient: Adjust clothing prior to toileting;Performs perineal hygiene;Adjust clothing after toileting Toileting Assistive Devices: Grab bar or rail for support (RW) Toileting: 6: Assistive device: No helper  FIM - Radio producer Devices: Mining engineer Transfers: 6-Assistive device: No helper;6-To toilet/ BSC;6-From toilet/BSC  FIM - Control and instrumentation engineer Devices: Environmental consultant;Arm rests Bed/Chair Transfer: 6: Supine > Sit:  No assist;6: Sit > Supine: No assist;6: Bed > Chair or W/C: No assist;6: Chair or W/C > Bed: No assist  FIM - Locomotion: Wheelchair Locomotion: Wheelchair: 0: Activity did not occur FIM - Locomotion: Ambulation Locomotion: Ambulation Assistive Devices: Administrator Ambulation/Gait Assistance: 6: Modified independent (Device/Increase time) Locomotion: Ambulation: 6: Travels 150 ft or more with assistive device/no helper  Comprehension Comprehension Mode: Auditory Comprehension: 5-Understands complex 90% of the time/Cues < 10% of the time  Expression Expression Mode: Verbal Expression: 6-Expresses complex ideas: With extra time/assistive device  Social Interaction Social Interaction Mode: Asleep Social Interaction: 7-Interacts appropriately with others - No medications needed.  Problem Solving Problem Solving Mode: Asleep Problem Solving: 5-Solves complex 90% of the time/cues < 10% of the time  Memory Memory Mode: Asleep Memory: 4-Recognizes or recalls 75 - 89% of the time/requires cueing 10 - 24% of the time  Medical Problem List and Plan:  1. Functional deficits secondary to Right L3 radiculopathy with sensory as well as motor deficits  2. DVT Prophylaxis/Anticoagulation: Pharmaceutical: Lovenox--added.  3. Pain Management:  use oxycodone on prn basis.   robaxin as needed for muscle pain.   -muscle spasm component--heat 4. Mood:  LCSW to follow for evaluation and support.  5. Neuropsych: This patient is capable of making decisions on his own behalf.  6. Skin/Wound Care: Routine pressure relief measures.     -wound drainage slowly improving  -change dry dressing daily to bid  -advised pt to contact surgeon if drainage increases or if he begins to have fevers. The wound looks fine to me today, and as long as he keeps it clean and dry, I think it will be ok. He will see surgeon in follow up as outpt. 7. Fluids/Electrolytes/Nutrition: Monitor I/O for adequate hydration. Add  nutritional supplement to help promote healing.  8. Acute on chronic iron deficiency anemia:   iron supplement.   9. FUO: ua neg, culture negative, labwork unremarkable.  10. Hyponatremia: Likely due to HCTZ---improved 11. DM type 2: Will monitor BS with ac/hs checks. Was on glipizide and metformin at home. Hgb A1c- 6.5. Po intake is good. Discontinued lantus and resumed oral medications. Continue to use SSI for elevated BS.   -improved control 12. HTN: Monitor every 8 hours. Continue Norvasc, Lisinopril and HCTZ.  31. H/o Hep C: Not on medications due to h/o cocaine abuse.  2. H/o gastric and duodenal ulcers: Continue protonix.  15. Acute on chronic Constipation:   lactulose bid for now. Continue linzess daily.   LOS (Days) 5 A FACE TO FACE EVALUATION WAS PERFORMED  Federica [place]port T 12/04/2013 7:55 AM

## 2013-12-05 NOTE — Progress Notes (Signed)
Occupational Therapy Discharge Summary  Patient Details  Name: Cory Weber MRN: [id number] Date of Birth: Nov 10, 1950   Patient has met 9 of 9 long term goals due to improved activity tolerance, improved balance and ability to compensate for deficits.  Patient to discharge at overall Modified Independent level.  Patient's care partner (daughter) is independent to provide the necessary physical assistance at discharge needed during return to home to assist with apartment setup requiring bending and lifting DME/AE and home furniture.    Reasons goals not met: n/a  Recommendation:  Patient will not require ongoing skilled OT services to advance functional skills in the area of BADL.  Equipment: Tub bench, BSC  Reasons for discharge: treatment goals met  Patient/family agrees with progress made and goals achieved: Yes  OT Discharge Precautions/Restrictions  Precautions Precautions: Fall;Back Precaution Comments: Pt able to name 2/3 precautions independently, 3/3 w/ mod cueing at final session to not "TWIST" Restrictions Weight Bearing Restrictions: No  Pain Pain Assessment Pain Assessment: 0-10 Pain Score: 5  Pain Type: Surgical pain Pain Location: Back Pain Orientation: Lower;Mid Pain Descriptors / Indicators: Aching Pain Onset: With Activity Pain Intervention(s): Medication (See eMAR);Rest;Repositioned Multiple Pain Sites: No  ADL ADL ADL Comments: see FIM  Vision/Perception  Vision- History Baseline Vision/History: No visual deficits;Wears glasses Wears Glasses: Reading only Patient Visual Report: No change from baseline Perception Comments: WFL   Cognition Overall Cognitive Status: Within Functional Limits for tasks assessed Arousal/Alertness: Awake/alert Orientation Level: Oriented X4 Attention: Alternating Focused Attention: Appears intact Sustained Attention: Appears intact Alternating Attention: Impaired Alternating Attention Impairment: Functional  complex Memory: Impaired Memory Impairment: Retrieval deficit (unable to generalize and use back precautions consistently during functional activities) Awareness: Appears intact Problem Solving: Appears intact Safety/Judgment: Appears intact  Sensation Sensation Light Touch: Appears Intact Stereognosis: Appears Intact Hot/Cold: Appears Intact Proprioception: Appears Intact Additional Comments: WFL for BUE Coordination Gross Motor Movements are Fluid and Coordinated: Yes Fine Motor Movements are Fluid and Coordinated: Yes  Motor  Motor Motor: Within Functional Limits Motor - Discharge Observations: limited only by spinal precautions  Mobility  Bed Mobility Bed Mobility: Rolling Right;Rolling Left;Supine to Sit;Sit to Supine Rolling Right: 6: Modified independent (Device/Increase time) Rolling Left: 6: Modified independent (Device/Increase time) Supine to Sit: 6: Modified independent (Device/Increase time) Sitting - Scoot to Edge of Bed: 6: Modified independent (Device/Increase time) Transfers Transfers: Sit to Stand;Stand to Sit Sit to Stand: 6: Modified independent (Device/Increase time) Stand to Sit: 6: Modified independent (Device/Increase time)   Trunk/Postural Assessment  Cervical Assessment Cervical Assessment: Within Functional Limits Thoracic Assessment Thoracic Assessment: Within Functional Limits Lumbar Assessment Lumbar Assessment: Exceptions to [facility] Lumbar AROM Overall Lumbar AROM: Deficits;Due to precautions Overall Lumbar AROM Comments: spinal Postural Control Postural Control: Within Functional Limits   Balance Balance Balance Assessed: Yes Standardized Balance Assessment Standardized Balance Assessment: Timed Up and Go Test Timed Up and Go Test TUG: Normal TUG Normal TUG (seconds): 28 Static Sitting Balance Static Sitting - Balance Support: Feet supported Static Sitting - Level of Assistance: 7: Independent Dynamic Sitting Balance Dynamic  Sitting - Balance Support: Feet supported;During functional activity Dynamic Sitting - Level of Assistance: 7: Independent Dynamic Sitting - Balance Activities: Lateral lean/weight shifting Static Standing Balance Static Standing - Balance Support: Bilateral upper extremity supported;During functional activity Static Standing - Level of Assistance: 6: Modified independent (Device/Increase time) Dynamic Standing Balance Dynamic Standing - Balance Support: Bilateral upper extremity supported;During functional activity Dynamic Standing - Level of Assistance: 6: Modified independent (Device/Increase time)  Dynamic Standing - Balance Activities: Lateral lean/weight shifting;Forward lean/weight shifting  Extremity/Trunk Assessment RUE Assessment RUE Assessment: Exceptions to [facility] RUE Strength RUE Overall Strength: Due to premorbid status LUE Assessment LUE Assessment: Within Functional Limits  See FIM for current functional status  [place] 12/05/2013, 7:50 AM

## 2013-12-07 NOTE — Progress Notes (Signed)
Social Work Discharge Note  The overall goal for the admission was met for:   Discharge location: Yes - home  Length of Stay: Yes - 5 days  Discharge activity level: Yes - modified independent  Home/community participation: Yes  Services provided included: MD, RD, PT, OT, RN, Pharmacy and SW  Financial Services: Medicaid  Follow-up services arranged: Home Health: RN, PT, DME: rolling walker, 3-in-1 commode; tub bench and Patient/Family has no preference for HH/DME agencies  Used [facility] for DME and [facility] for RN and PT (if PT is approved by medicaid)  Comments (or additional information):  Pt's dtr came from [facility] to help pt transition home.  He has a couple of sisters and friends who can help him intermittently when he needs something - ie groceries, trip to pharmacy, etc.  Patient/Family verbalized understanding of follow-up arrangements: Yes  Individual responsible for coordination of the follow-up plan: pt  Confirmed correct DME delivered: Trey Sailors 12/07/2013    Jamella Grayer, Silvestre Mesi

## 2013-12-09 ENCOUNTER — Telehealth: Payer: Self-pay | Admitting: *Deleted

## 2013-12-09 NOTE — Telephone Encounter (Signed)
Sharyn Lull from [facility] called regarding pt Cory Weber  DOB:   [birth year]    Need verbal orders for home PT. 2 sessions a week for 4 weeks.  For balance,gait, back safety

## 2013-12-09 NOTE — Telephone Encounter (Signed)
Called [place] back at [facility], gave authorization per office protocol to extend [facility] physical therapy.

## 2013-12-10 ENCOUNTER — Telehealth: Payer: Self-pay | Admitting: *Deleted

## 2013-12-10 NOTE — Telephone Encounter (Signed)
Pain medications were effective in hospital. He needs to contact the surgeon if he's having more pain.

## 2013-12-10 NOTE — Telephone Encounter (Signed)
error 

## 2013-12-10 NOTE — Telephone Encounter (Signed)
Pt. Called clinic, discharged recently from [facility], states pain meds are not working, hoping for an increase in strength, pt has not been seen in clinic yet

## 2013-12-21 NOTE — Telephone Encounter (Signed)
Contacted pt informed that the provider would like the pt to speak with the surgeon if pain meds are still needed or if dose strength needs to change

## 2013-12-27 ENCOUNTER — Emergency Department (HOSPITAL_COMMUNITY)
Admission: EM | Admit: 2013-12-27 | Discharge: 2013-12-27 | Disposition: A | Discharge status: Home / Self Care | Payer: Medicaid Other | Attending: Emergency Medicine | Admitting: Emergency Medicine

## 2013-12-27 ENCOUNTER — Encounter (HOSPITAL_COMMUNITY): Payer: Self-pay | Admitting: *Deleted

## 2013-12-27 DIAGNOSIS — Z8673 Personal history of transient ischemic attack (TIA), and cerebral infarction without residual deficits: Secondary | ICD-10-CM | POA: Diagnosis not present

## 2013-12-27 DIAGNOSIS — D649 Anemia, unspecified: Secondary | ICD-10-CM | POA: Insufficient documentation

## 2013-12-27 DIAGNOSIS — E119 Type 2 diabetes mellitus without complications: Secondary | ICD-10-CM | POA: Insufficient documentation

## 2013-12-27 DIAGNOSIS — Z981 Arthrodesis status: Secondary | ICD-10-CM

## 2013-12-27 DIAGNOSIS — Z79899 Other long term (current) drug therapy: Secondary | ICD-10-CM | POA: Insufficient documentation

## 2013-12-27 DIAGNOSIS — T814XXA Infection following a procedure, initial encounter: Secondary | ICD-10-CM | POA: Diagnosis not present

## 2013-12-27 DIAGNOSIS — I739 Peripheral vascular disease, unspecified: Secondary | ICD-10-CM | POA: Diagnosis not present

## 2013-12-27 DIAGNOSIS — E785 Hyperlipidemia, unspecified: Secondary | ICD-10-CM | POA: Diagnosis not present

## 2013-12-27 DIAGNOSIS — Y838 Other surgical procedures as the cause of abnormal reaction of the patient, or of later complication, without mention of misadventure at the time of the procedure: Secondary | ICD-10-CM | POA: Diagnosis not present

## 2013-12-27 DIAGNOSIS — J45909 Unspecified asthma, uncomplicated: Secondary | ICD-10-CM | POA: Diagnosis not present

## 2013-12-27 DIAGNOSIS — Z8619 Personal history of other infectious and parasitic diseases: Secondary | ICD-10-CM | POA: Insufficient documentation

## 2013-12-27 DIAGNOSIS — Z792 Long term (current) use of antibiotics: Secondary | ICD-10-CM | POA: Diagnosis not present

## 2013-12-27 DIAGNOSIS — T798XXA Other early complications of trauma, initial encounter: Secondary | ICD-10-CM

## 2013-12-27 DIAGNOSIS — I10 Essential (primary) hypertension: Secondary | ICD-10-CM | POA: Insufficient documentation

## 2013-12-27 MED ORDER — HYDROCODONE-ACETAMINOPHEN 5-325 MG PO TABS
2.0000 | ORAL_TABLET | Freq: Once | ORAL | Status: AC
  Administered 2013-12-27: 2 via ORAL
  Filled 2013-12-27: qty 2

## 2013-12-27 MED ORDER — DOXYCYCLINE HYCLATE 100 MG PO CAPS: 100.0000 mg | ORAL_CAPSULE | Freq: Two times a day (BID) | ORAL | Status: DC

## 2013-12-27 MED ORDER — HYDROCODONE-ACETAMINOPHEN 5-325 MG PO TABS: 1.0000 | ORAL_TABLET | Freq: Four times a day (QID) | ORAL | Status: DC | PRN

## 2013-12-27 NOTE — ED Notes (Signed)
Applied sterile gauze to center of back to cover wound. No drainage noted at site. Patient tolerated well.

## 2013-12-27 NOTE — ED Provider Notes (Signed)
CSN: [id number]     Arrival date & time 12/27/13  1021 History  This chart was scribed for Veryl Speak, MD by Chester Holstein, ED Scribe. This patient was seen in room APA07/APA07 and the patient's care was started at 11:13 AM.    Chief Complaint  Patient presents with  . Post-op Problem    The history is provided by the patient. No language interpreter was used.   HPI Comments: Cory Weber is a 63 y.o. male who presents to the Emergency Department complaining of back pain and drainage with onset yesterday following back surgery 2 weeks prior.  Pt notes diaphoresis, pain that radiates to legs and describes back area as cold. Pt denies fever. He has a followup appointment with Dr. Sherley Bounds on November 30th.   Past Medical History  Diagnosis Date  . Diabetes mellitus   . Hypertension   . Hepatitis C     states he was diagnosed in 2007 or 2007 while living in [place], [place]  . CVA (cerebral infarction) 01/2011  . Hyperlipidemia   . Asthma   . PVD (peripheral vascular disease)   . Anemia   . Stroke   . [id number]    Past Surgical History  Procedure Laterality Date  . Liver biopsy  2005    Done in [place], [place]. Chronic hepatitis with mild periportal inflammation, lobular unicellular necrosis and portal fibrosis. Grade 2, stage 1-2.  Marland Kitchen Colonoscopy with propofol N/A 09/03/2012    [id number] polyp-removed as outlined above. Prominent internal hemorrhoids. Tubular adenoma  . Esophagogastroduodenoscopy (egd) with propofol N/A 09/03/2012    [id number] hernia. Gastric diverticulum. Gastric ulcers with associated erosions. Duodenal erosions. Status post gastric biopsy. H.PYLORI gastritis   . Esophageal biopsy N/A 09/03/2012    Procedure: BIOPSY;  Surgeon: Daneil Dolin, MD;  Location: [facility];  Service: Endoscopy;  Laterality: N/A;  gastric and gastric mucosa  . Polypectomy N/A 09/03/2012    Procedure: POLYPECTOMY;  Surgeon: Daneil Dolin, MD;  Location: [facility];  Service:  Endoscopy;  Laterality: N/A;  cecal polyp  . Esophagogastroduodenoscopy (egd) with propofol N/A 12/03/2012    Procedure: ESOPHAGOGASTRODUODENOSCOPY (EGD) WITH PROPOFOL;  Surgeon: Daneil Dolin, MD;  Location: [facility];  Service: Endoscopy;  Laterality: N/A;  . Esophageal biopsy N/A 12/03/2012    Procedure: BIOPSY;  Surgeon: Daneil Dolin, MD;  Location: [facility];  Service: Endoscopy;  Laterality: N/A;  . Maximum access (mas)posterior lumbar interbody fusion (plif) 1 level N/A 11/25/2013    Procedure: FOR MAXIMUM ACCESS (MAS) POSTERIOR LUMBAR INTERBODY FUSION (PLIF) 1 LEVEL;  Surgeon: Eustace Moore, MD;  Location: [facility];  Service: Neurosurgery;  Laterality: N/A;  FOR MAXIMUM ACCESS (MAS) POSTERIOR LUMBAR INTERBODY FUSION (PLIF) 1 LEVEL LUMBAR 3-4   Family History  Problem Relation Age of Onset  . Breast cancer Mother     deceased  . Cancer Mother   . Diabetes Father   . Hypertension Father   . Heart disease Father     deceased  . Hyperlipidemia Father   . Diabetes Sister   . Hypertension Sister   . Breast cancer Sister   . Colon cancer Neg Hx   . Liver disease Neg Hx    History  Substance Use Topics  . Smoking status: Current Some Day Smoker -- 0.50 packs/day for 20 years    Types: Cigarettes  . Smokeless tobacco: Never Used     Comment: given 1-800-Quit Now  . Alcohol Use: 0.0 oz/week  Comment: occ    Review of Systems A complete 10 system review of systems was obtained and all systems are negative except as noted in the HPI and PMH.    Allergies  Review of patient's allergies indicates no known allergies.  Home Medications   Prior to Admission medications   Medication Sig Start Date End Date Taking? Authorizing Provider  amLODipine (NORVASC) 5 MG tablet Take 1 tablet (5 mg total) by mouth daily. 12/03/13   Lavon Paganini Angiulli, PA-C  aspirin 81 MG tablet Take 81 mg by mouth daily.    Historical Provider, MD  esomeprazole (NEXIUM) 40 MG capsule TAKE ONE CAPSULE BY  MOUTH DAILY BEFORE BREAKFAST. 12/03/13   Lavon Paganini Angiulli, PA-C  ferrous fumarate (HEMOCYTE - 106 MG FE) 325 (106 FE) MG TABS tablet Take 1 tablet (106 mg of iron total) by mouth 2 (two) times daily. 12/03/13   Lavon Paganini Angiulli, PA-C  gabapentin (NEURONTIN) 300 MG capsule Take 1 capsule (300 mg total) by mouth 2 (two) times daily. 12/03/13   Daniel J Angiulli, PA-C  Linaclotide (LINZESS) 290 MCG CAPS capsule Take 1 capsule (290 mcg total) by mouth daily. 30 minutes before breakfast. 12/03/13   Lavon Paganini Angiulli, PA-C  lisinopril-hydrochlorothiazide (PRINZIDE,ZESTORETIC) 20-12.5 MG per tablet Take 1 tablet by mouth 2 (two) times daily. 12/03/13   Lavon Paganini Angiulli, PA-C  metFORMIN (GLUCOPHAGE) 1000 MG tablet Take 1 tablet (1,000 mg total) by mouth 2 (two) times daily with a meal. 12/03/13   Lavon Paganini Angiulli, PA-C  methocarbamol (ROBAXIN) 500 MG tablet Take 1 tablet (500 mg total) by mouth every 6 (six) hours as needed for muscle spasms. 12/03/13   Lavon Paganini Angiulli, PA-C  nicotine (NICODERM CQ - DOSED IN MG/24 HOURS) 14 mg/24hr patch 14 mg patch daily x2 weeks then 7 mg patch daily x3 weeks and stop 12/03/13   Lavon Paganini Angiulli, PA-C  oxyCODONE (OXY IR/ROXICODONE) 5 MG immediate release tablet Take 1-2 tablets (5-10 mg total) by mouth every 4 (four) hours as needed for severe pain. 12/03/13   Lavon Paganini Angiulli, PA-C  simvastatin (ZOCOR) 20 MG tablet Take 1 tablet (20 mg total) by mouth daily at 6 PM. 12/03/13   Lavon Paganini Angiulli, PA-C   BP 112/70 mmHg  Pulse 84  Temp(Src) 98.1 F (36.7 C) (Oral)  Resp 16  Ht 6\' 1"  (1.854 m)  Wt 218 lb (98.884 kg)  BMI 28.77 kg/m2  SpO2 100% Physical Exam  Constitutional: He is oriented to person, place, and time. He appears well-developed and well-nourished.  HENT:  Head: Normocephalic.  Eyes: Conjunctivae are normal.  Neck: Normal range of motion. Neck supple.  Cardiovascular: Normal rate, regular rhythm and normal heart sounds.   No murmur  heard. Pulmonary/Chest: Effort normal and breath sounds normal. No respiratory distress. He has no wheezes. He has no rales.  Musculoskeletal: Normal range of motion.  There is a surgical incision to the upper lumbar region. The wound appears to have separated and is draining serous fluid tinged with perulent fluid.   Neurological: He is alert and oriented to person, place, and time.  Skin: Skin is warm and dry.  Psychiatric: He has a normal mood and affect. His behavior is normal.  Nursing note and vitals reviewed.   ED Course  Procedures (including critical care time) DIAGNOSTIC STUDIES: Oxygen Saturation is 100% on room air, normal by my interpretation.    COORDINATION OF CARE: 11:16 AM Discussed treatment plan with patient at beside, the  patient agrees with the plan and has no further questions at this time.   Labs Review Labs Reviewed - No data to display  Imaging Review No results found.   EKG Interpretation None      MDM   Final diagnoses:  None    Patient presents with complaints of drainage from the cervical incision from his recent lumbar fusion. I am able to express serous fluid that is tinged with pus. I'm concerned about a wound infection. I've discussed this with Dr. Ronnald Ramp who did his surgery. Dr. Ronnald Ramp is recommending doxycycline, dry dressings, and follow-up tomorrow in his office at 9:15 for him to evaluate the wound.   I personally performed the services described in this documentation, which was scribed in my presence. The recorded information has been reviewed and is accurate.      Veryl Speak, MD 12/27/13 1131

## 2013-12-27 NOTE — ED Notes (Signed)
Pt has back surgery 3 weeks ago. Presents today with back incision drainage that started yesterday.

## 2013-12-27 NOTE — Discharge Instructions (Signed)
Doxycycline 100 mg twice daily as prescribed.  Dr. Ronnald Ramp will see you tomorrow at 9:15 in the morning to evaluate your surgical incision.   Wound Infection A wound infection happens when a type of germ (bacteria) starts growing in the wound. In some cases, this can cause the wound to break open. If cared for properly, the infected wound will heal from the inside to the outside. Wound infections need treatment. CAUSES An infection is caused by bacteria growing in the wound.  SYMPTOMS   Increase in redness, swelling, or pain at the wound site.  Increase in drainage at the wound site.  Wound or bandage (dressing) starts to smell bad.  Fever.  Feeling tired or fatigued.  Pus draining from the wound. TREATMENT  Your health care provider will prescribe antibiotic medicine. The wound infection should improve within 24 to 48 hours. Any redness around the wound should stop spreading and the wound should be less painful.  HOME CARE INSTRUCTIONS   Only take over-the-counter or prescription medicines for pain, discomfort, or fever as directed by your health care provider.  Take your antibiotics as directed. Finish them even if you start to feel better.  Gently wash the area with mild soap and water 2 times a day, or as directed. Rinse off the soap. Pat the area dry with a clean towel. Do not rub the wound. This may cause bleeding.  Follow your health care provider's instructions for how often you need to change the dressing.  Apply ointment and a dressing to the wound as directed.  If the dressing sticks, moisten it with soapy water and gently remove it.  Change the bandage right away if it becomes wet, dirty, or develops a bad smell.  Take showers. Do not take tub baths, swim, or do anything that may soak the wound until it is healed.  Avoid exercises that make you sweat heavily.  Use anti-itch medicine as directed by your health care provider. The wound may itch when it is healing.  Do not pick or scratch at the wound.  Follow up with your health care provider to get your wound rechecked as directed. SEEK MEDICAL CARE IF:  You have an increase in swelling, pain, or redness around the wound.  You have an increase in the amount of pus coming from the wound.  There is a bad smell coming from the wound.  More of the wound breaks open.  You have a fever. MAKE SURE YOU:   Understand these instructions.  Will watch your condition.  Will get help right away if you are not doing well or get worse. Document Released: 10/27/2002 Document Revised: 02/02/2013 Document Reviewed: 06/03/2010 [facility] [place] Patient Information 2015 [place], [place]. This information is not intended to replace advice given to you by your health care provider. Make sure you discuss any questions you have with your health care provider.

## 2013-12-29 NOTE — Discharge Summary (Signed)
Physician Discharge Summary  Patient ID: Cory Weber MRN: [id number] DOB/AGE: 1950-08-07 63 y.o.  Admit date: 11/23/2013 Discharge date: 12/29/2013  Admission Diagnoses: lumbar disc herniation, spondylolisthesis, right leg weakness    Discharge Diagnoses: same   Discharged Condition: stable  Hospital Course: The patient was admitted on 11/23/2013 and taken to the operating room where the patient underwent posterior lumbar interbody fusion L3-4. The patient tolerated the procedure well and was taken to the recovery room and then to the floor in stable condition. The hospital course was routine. There were no complications. The wound remained clean dry and intact. Pt had appropriate back soreness. No complaints of leg pain or new N/T/W. The patient's right quadricep weakness that he presented with was stable at the time of discharge.The patient remained afebrile with stable vital signs, and tolerated a regular diet. The patient continued to increase activities, and pain was well controlled with oral pain medications.   Consults: None  Significant Diagnostic Studies:  Results for orders placed or performed during the hospital encounter of 11/23/13  Surgical pcr screen  Result Value Ref Range   MRSA, PCR NEGATIVE NEGATIVE   Staphylococcus aureus POSITIVE (A) NEGATIVE  Culture, blood (routine x 2)  Result Value Ref Range   Specimen Description BLOOD RIGHT ARM    Special Requests BOTTLES DRAWN AEROBIC AND ANAEROBIC 10CC EACH    Culture  Setup Time      11/27/2013 19:22 Performed at [facility]   Culture      NO GROWTH 5 DAYS Performed at [facility]   Report Status 12/03/2013 FINAL   Culture, blood (routine x 2)  Result Value Ref Range   Specimen Description BLOOD RIGHT HAND    Special Requests BOTTLES DRAWN AEROBIC AND ANAEROBIC 10CC EACH    Culture  Setup Time      11/27/2013 19:22 Performed at [facility] 5  DAYS Performed at [facility]   Report Status 12/03/2013 FINAL   CBC with Differential  Result Value Ref Range   WBC 7.7 4.0 - 10.5 K/uL   RBC 3.38 (L) 4.22 - 5.81 MIL/uL   Hemoglobin 7.7 (L) 13.0 - 17.0 g/dL   HCT 25.8 (L) 39.0 - 52.0 %   MCV 76.3 (L) 78.0 - 100.0 fL   MCH 22.8 (L) 26.0 - 34.0 pg   MCHC 29.8 (L) 30.0 - 36.0 g/dL   RDW 16.8 (H) 11.5 - 15.5 %   Platelets 336 150 - 400 K/uL   Neutrophils Relative % 68 43 - 77 %   Neutro Abs 5.2 1.7 - 7.7 K/uL   Lymphocytes Relative 20 12 - 46 %   Lymphs Abs 1.6 0.7 - 4.0 K/uL   Monocytes Relative 11 3 - 12 %   Monocytes Absolute 0.9 0.1 - 1.0 K/uL   Eosinophils Relative 1 0 - 5 %   Eosinophils Absolute 0.1 0.0 - 0.7 K/uL   Basophils Relative 0 0 - 1 %   Basophils Absolute 0.0 0.0 - 0.1 K/uL  Basic metabolic panel  Result Value Ref Range   Sodium 135 (L) 137 - 147 mEq/L   Potassium 4.1 3.7 - 5.3 mEq/L   Chloride 98 96 - 112 mEq/L   CO2 26 19 - 32 mEq/L   Glucose, Bld 222 (H) 70 - 99 mg/dL   BUN 12 6 - 23 mg/dL   Creatinine, Ser 0.97 0.50 - 1.35 mg/dL  Calcium 9.2 8.4 - 10.5 mg/dL   GFR calc non Af Amer 87 (L) >90 mL/min   GFR calc Af Amer >90 >90 mL/min   Anion gap 11 5 - 15  Troponin I  Result Value Ref Range   Troponin I <0.30 <0.30 ng/mL  Urinalysis, Routine w reflex microscopic  Result Value Ref Range   Color, Urine YELLOW YELLOW   APPearance CLEAR CLEAR   Specific Gravity, Urine 1.010 1.005 - 1.030   pH 5.5 5.0 - 8.0   Glucose, UA 250 (A) NEGATIVE mg/dL   Hgb urine dipstick NEGATIVE NEGATIVE   Bilirubin Urine NEGATIVE NEGATIVE   Ketones, ur NEGATIVE NEGATIVE mg/dL   Protein, ur NEGATIVE NEGATIVE mg/dL   Urobilinogen, UA 0.2 0.0 - 1.0 mg/dL   Nitrite NEGATIVE NEGATIVE   Leukocytes, UA NEGATIVE NEGATIVE  APTT  Result Value Ref Range   aPTT 37 24 - 37 seconds  Protime-INR  Result Value Ref Range   Prothrombin Time 14.2 11.6 - 15.2 seconds   INR 1.09 0.00 - 1.49  Hemoglobin A1c  Result Value Ref  Range   Hgb A1c MFr Bld 6.5 (H) <5.7 %   Mean Plasma Glucose 140 (H) <117 mg/dL  Lipid panel  Result Value Ref Range   Cholesterol 127 0 - 200 mg/dL   Triglycerides 103 <150 mg/dL   HDL 44 >39 mg/dL   Total CHOL/HDL Ratio 2.9 RATIO   VLDL 21 0 - 40 mg/dL   LDL Cholesterol 62 0 - 99 mg/dL  Vitamin B12  Result Value Ref Range   Vitamin B-12 493 211 - 911 pg/mL  Folate  Result Value Ref Range   Folate >20.0 ng/mL  Iron and TIBC  Result Value Ref Range   Iron 62 42 - 135 ug/dL   TIBC 567 (H) 215 - 435 ug/dL   Saturation Ratios 11 (L) 20 - 55 %   UIBC 505 (H) 125 - 400 ug/dL  Ferritin  Result Value Ref Range   Ferritin 5 (L) 22 - 322 ng/mL  Reticulocytes  Result Value Ref Range   Retic Ct Pct 1.5 0.4 - 3.1 %   RBC. 4.06 (L) 4.22 - 5.81 MIL/uL   Retic Count, Manual 60.9 19.0 - 186.0 K/uL  Glucose, capillary  Result Value Ref Range   Glucose-Capillary 139 (H) 70 - 99 mg/dL   Comment 1 Documented in Chart    Comment 2 Notify RN   Glucose, capillary  Result Value Ref Range   Glucose-Capillary 176 (H) 70 - 99 mg/dL   Comment 1 Notify RN   Glucose, capillary  Result Value Ref Range   Glucose-Capillary 243 (H) 70 - 99 mg/dL   Comment 1 Notify RN   CBC  Result Value Ref Range   WBC 7.7 4.0 - 10.5 K/uL   RBC 4.04 (L) 4.22 - 5.81 MIL/uL   Hemoglobin 9.8 (L) 13.0 - 17.0 g/dL   HCT 31.7 (L) 39.0 - 52.0 %   MCV 78.5 78.0 - 100.0 fL   MCH 24.3 (L) 26.0 - 34.0 pg   MCHC 30.9 30.0 - 36.0 g/dL   RDW 17.0 (H) 11.5 - 15.5 %   Platelets 338 150 - 400 K/uL  Glucose, capillary  Result Value Ref Range   Glucose-Capillary 167 (H) 70 - 99 mg/dL   Comment 1 Documented in Chart    Comment 2 Notify RN   CBC  Result Value Ref Range   WBC 10.2 4.0 - 10.5 K/uL   RBC  4.07 (L) 4.22 - 5.81 MIL/uL   Hemoglobin 9.7 (L) 13.0 - 17.0 g/dL   HCT 30.8 (L) 39.0 - 52.0 %   MCV 75.7 (L) 78.0 - 100.0 fL   MCH 23.8 (L) 26.0 - 34.0 pg   MCHC 31.5 30.0 - 36.0 g/dL   RDW 16.8 (H) 11.5 - 15.5 %    Platelets 318 150 - 400 K/uL  Basic metabolic panel  Result Value Ref Range   Sodium 133 (L) 137 - 147 mEq/L   Potassium 4.3 3.7 - 5.3 mEq/L   Chloride 97 96 - 112 mEq/L   CO2 24 19 - 32 mEq/L   Glucose, Bld 286 (H) 70 - 99 mg/dL   BUN 17 6 - 23 mg/dL   Creatinine, Ser 0.89 0.50 - 1.35 mg/dL   Calcium 9.2 8.4 - 10.5 mg/dL   GFR calc non Af Amer >90 >90 mL/min   GFR calc Af Amer >90 >90 mL/min   Anion gap 12 5 - 15  Glucose, capillary  Result Value Ref Range   Glucose-Capillary 434 (H) 70 - 99 mg/dL  Glucose, capillary  Result Value Ref Range   Glucose-Capillary 343 (H) 70 - 99 mg/dL   Comment 1 Documented in Chart    Comment 2 Notify RN   Glucose, capillary  Result Value Ref Range   Glucose-Capillary 270 (H) 70 - 99 mg/dL   Comment 1 Documented in Chart    Comment 2 Notify RN   Glucose, capillary  Result Value Ref Range   Glucose-Capillary 137 (H) 70 - 99 mg/dL   Comment 1 Notify RN    Comment 2 Documented in Chart   Basic metabolic panel  Result Value Ref Range   Sodium 133 (L) 137 - 147 mEq/L   Potassium 4.0 3.7 - 5.3 mEq/L   Chloride 96 96 - 112 mEq/L   CO2 25 19 - 32 mEq/L   Glucose, Bld 196 (H) 70 - 99 mg/dL   BUN 16 6 - 23 mg/dL   Creatinine, Ser 1.08 0.50 - 1.35 mg/dL   Calcium 9.3 8.4 - 10.5 mg/dL   GFR calc non Af Amer 72 (L) >90 mL/min   GFR calc Af Amer 84 (L) >90 mL/min   Anion gap 12 5 - 15  CBC  Result Value Ref Range   WBC 12.3 (H) 4.0 - 10.5 K/uL   RBC 3.94 (L) 4.22 - 5.81 MIL/uL   Hemoglobin 9.5 (L) 13.0 - 17.0 g/dL   HCT 31.3 (L) 39.0 - 52.0 %   MCV 79.4 78.0 - 100.0 fL   MCH 24.1 (L) 26.0 - 34.0 pg   MCHC 30.4 30.0 - 36.0 g/dL   RDW 17.3 (H) 11.5 - 15.5 %   Platelets 311 150 - 400 K/uL  Glucose, capillary  Result Value Ref Range   Glucose-Capillary 219 (H) 70 - 99 mg/dL   Comment 1 Documented in Chart    Comment 2 Notify RN   Glucose, capillary  Result Value Ref Range   Glucose-Capillary 222 (H) 70 - 99 mg/dL  Glucose, capillary   Result Value Ref Range   Glucose-Capillary 202 (H) 70 - 99 mg/dL   Comment 1 Documented in Chart    Comment 2 Notify RN   Glucose, capillary  Result Value Ref Range   Glucose-Capillary 158 (H) 70 - 99 mg/dL   Comment 1 Notify RN   Glucose, capillary  Result Value Ref Range   Glucose-Capillary 192 (H) 70 - 99 mg/dL  Comment 1 Notify RN   CBC  Result Value Ref Range   WBC 11.8 (H) 4.0 - 10.5 K/uL   RBC 3.95 (L) 4.22 - 5.81 MIL/uL   Hemoglobin 9.4 (L) 13.0 - 17.0 g/dL   HCT 30.8 (L) 39.0 - 52.0 %   MCV 78.0 78.0 - 100.0 fL   MCH 23.8 (L) 26.0 - 34.0 pg   MCHC 30.5 30.0 - 36.0 g/dL   RDW 17.6 (H) 11.5 - 15.5 %   Platelets 292 150 - 400 K/uL  Basic metabolic panel  Result Value Ref Range   Sodium 131 (L) 137 - 147 mEq/L   Potassium 4.1 3.7 - 5.3 mEq/L   Chloride 93 (L) 96 - 112 mEq/L   CO2 28 19 - 32 mEq/L   Glucose, Bld 163 (H) 70 - 99 mg/dL   BUN 15 6 - 23 mg/dL   Creatinine, Ser 1.18 0.50 - 1.35 mg/dL   Calcium 9.1 8.4 - 10.5 mg/dL   GFR calc non Af Amer 65 (L) >90 mL/min   GFR calc Af Amer 75 (L) >90 mL/min   Anion gap 10 5 - 15  Glucose, capillary  Result Value Ref Range   Glucose-Capillary 246 (H) 70 - 99 mg/dL   Comment 1 Documented in Chart    Comment 2 Notify RN   Urinalysis, Routine w reflex microscopic  Result Value Ref Range   Color, Urine YELLOW YELLOW   APPearance CLEAR CLEAR   Specific Gravity, Urine 1.006 1.005 - 1.030   pH 5.5 5.0 - 8.0   Glucose, UA 100 (A) NEGATIVE mg/dL   Hgb urine dipstick NEGATIVE NEGATIVE   Bilirubin Urine NEGATIVE NEGATIVE   Ketones, ur NEGATIVE NEGATIVE mg/dL   Protein, ur NEGATIVE NEGATIVE mg/dL   Urobilinogen, UA 1.0 0.0 - 1.0 mg/dL   Nitrite NEGATIVE NEGATIVE   Leukocytes, UA NEGATIVE NEGATIVE  Lactic acid, plasma  Result Value Ref Range   Lactic Acid, Venous 1.3 0.5 - 2.2 mmol/L  Glucose, capillary  Result Value Ref Range   Glucose-Capillary 185 (H) 70 - 99 mg/dL   Comment 1 Documented in Chart    Comment 2  Notify RN   Glucose, capillary  Result Value Ref Range   Glucose-Capillary 162 (H) 70 - 99 mg/dL  Glucose, capillary  Result Value Ref Range   Glucose-Capillary 225 (H) 70 - 99 mg/dL  CBC  Result Value Ref Range   WBC 10.5 4.0 - 10.5 K/uL   RBC 3.62 (L) 4.22 - 5.81 MIL/uL   Hemoglobin 8.6 (L) 13.0 - 17.0 g/dL   HCT 28.7 (L) 39.0 - 52.0 %   MCV 79.3 78.0 - 100.0 fL   MCH 23.8 (L) 26.0 - 34.0 pg   MCHC 30.0 30.0 - 36.0 g/dL   RDW 17.3 (H) 11.5 - 15.5 %   Platelets 235 150 - 400 K/uL  Basic metabolic panel  Result Value Ref Range   Sodium 131 (L) 137 - 147 mEq/L   Potassium 4.1 3.7 - 5.3 mEq/L   Chloride 93 (L) 96 - 112 mEq/L   CO2 28 19 - 32 mEq/L   Glucose, Bld 171 (H) 70 - 99 mg/dL   BUN 15 6 - 23 mg/dL   Creatinine, Ser 1.07 0.50 - 1.35 mg/dL   Calcium 9.0 8.4 - 10.5 mg/dL   GFR calc non Af Amer 73 (L) >90 mL/min   GFR calc Af Amer 85 (L) >90 mL/min   Anion gap 10 5 - 15  Glucose, capillary  Result Value Ref Range   Glucose-Capillary 172 (H) 70 - 99 mg/dL  Glucose, capillary  Result Value Ref Range   Glucose-Capillary 173 (H) 70 - 99 mg/dL  Glucose, capillary  Result Value Ref Range   Glucose-Capillary 218 (H) 70 - 99 mg/dL  Glucose, capillary  Result Value Ref Range   Glucose-Capillary 167 (H) 70 - 99 mg/dL  CBC  Result Value Ref Range   WBC 7.4 4.0 - 10.5 K/uL   RBC 3.75 (L) 4.22 - 5.81 MIL/uL   Hemoglobin 8.9 (L) 13.0 - 17.0 g/dL   HCT 29.0 (L) 39.0 - 52.0 %   MCV 77.3 (L) 78.0 - 100.0 fL   MCH 23.7 (L) 26.0 - 34.0 pg   MCHC 30.7 30.0 - 36.0 g/dL   RDW 16.9 (H) 11.5 - 15.5 %   Platelets 288 150 - 400 K/uL  Basic metabolic panel  Result Value Ref Range   Sodium 130 (L) 137 - 147 mEq/L   Potassium 3.8 3.7 - 5.3 mEq/L   Chloride 92 (L) 96 - 112 mEq/L   CO2 28 19 - 32 mEq/L   Glucose, Bld 176 (H) 70 - 99 mg/dL   BUN 11 6 - 23 mg/dL   Creatinine, Ser 0.94 0.50 - 1.35 mg/dL   Calcium 9.0 8.4 - 10.5 mg/dL   GFR calc non Af Amer 88 (L) >90 mL/min   GFR  calc Af Amer >90 >90 mL/min   Anion gap 10 5 - 15  Glucose, capillary  Result Value Ref Range   Glucose-Capillary 185 (H) 70 - 99 mg/dL  Glucose, capillary  Result Value Ref Range   Glucose-Capillary 174 (H) 70 - 99 mg/dL   Comment 1 Notify RN    Comment 2 Documented in Chart   Glucose, capillary  Result Value Ref Range   Glucose-Capillary 156 (H) 70 - 99 mg/dL   Comment 1 Documented in Chart    Comment 2 Notify RN   POC occult blood, ED Provider will collect  Result Value Ref Range   Fecal Occult Bld NEGATIVE NEGATIVE  Prepare RBC  Result Value Ref Range   Order Confirmation ORDER PROCESSED BY BLOOD BANK   Type and screen  Result Value Ref Range   ABO/RH(D) O POS    Antibody Screen NEG    Sample Expiration 11/26/2013    Unit Number [id number]    Blood Component Type RBC LR PHER1    Unit division 00    Status of Unit ISSUED,FINAL    Transfusion Status OK TO TRANSFUSE    Crossmatch Result Compatible    Unit Number [id number]    Blood Component Type RED CELLS,LR    Unit division 00    Status of Unit ISSUED,FINAL    Transfusion Status OK TO TRANSFUSE    Crossmatch Result Compatible     No results found.  Antibiotics:  Anti-infectives    Start     Dose/Rate Route Frequency Ordered Stop   11/28/13 0400  vancomycin (VANCOCIN) 1,250 mg in sodium chloride 0.9 % 250 mL IVPB  Status:  Discontinued     1,250 mg166.7 mL/hr over 90 Minutes Intravenous Every 12 hours 11/27/13 1654 11/29/13 1146   11/27/13 1400  vancomycin (VANCOCIN) IVPB 1000 mg/200 mL premix  Status:  Discontinued     1,000 mg200 mL/hr over 60 Minutes Intravenous Every 8 hours 11/27/13 0746 11/27/13 1654   11/27/13 0800  piperacillin-tazobactam (ZOSYN) IVPB 3.375 g  Status:  Discontinued  3.375 g12.5 mL/hr over 240 Minutes Intravenous 3 times per day 11/27/13 0732 11/29/13 1146   11/27/13 0800  vancomycin (VANCOCIN) 1,500 mg in sodium chloride 0.9 % 500 mL IVPB     1,500 mg250 mL/hr over 120 Minutes  Intravenous  Once 11/27/13 0746 11/27/13 1304   11/27/13 0730  piperacillin-tazobactam (ZOSYN) IVPB 3.375 g  Status:  Discontinued     3.375 g12.5 mL/hr over 240 Minutes Intravenous 4 times per day 11/27/13 0723 11/27/13 0732   11/26/13 0500  vancomycin (VANCOCIN) IVPB 1000 mg/200 mL premix     1,000 mg200 mL/hr over 60 Minutes Intravenous  Once 11/25/13 2051 11/26/13 0549   11/25/13 1805  bacitracin 50,000 Units in sodium chloride irrigation 0.9 % 500 mL irrigation  Status:  Discontinued       As needed 11/25/13 1805 11/25/13 1937   11/25/13 1730  vancomycin (VANCOCIN) IVPB 1000 mg/200 mL premix  Status:  Discontinued     1,000 mg200 mL/hr over 60 Minutes Intravenous To Neuro OR-Station #32 11/25/13 1727 11/25/13 2038      Discharge Exam: Blood pressure 125/89, pulse 67, temperature 98.5 F (36.9 C), temperature source Oral, resp. rate 18, height 6\' 1"  (1.854 m), weight 201 lb (91.173 kg), SpO2 100 %. Neurologic: Grossly normal except for 1 out of 5 right quadriceps strength stable from preop Incision clean dry and intact  Discharge Medications:     Medication List    STOP taking these medications        amLODipine 5 MG tablet  Commonly known as:  NORVASC     aspirin 81 MG tablet     esomeprazole 40 MG capsule  Commonly known as:  NEXIUM     gabapentin 300 MG capsule  Commonly known as:  NEURONTIN     glipiZIDE 2.5 mg Tabs tablet  Commonly known as:  GLUCOTROL     Linaclotide 290 MCG Caps capsule  Commonly known as:  LINZESS     lisinopril-hydrochlorothiazide 20-12.5 MG per tablet  Commonly known as:  PRINZIDE,ZESTORETIC     metFORMIN 500 MG tablet  Commonly known as:  GLUCOPHAGE     simvastatin 20 MG tablet  Commonly known as:  ZOCOR        Disposition: home   Final Dx: PLIF L3-4       Signed: Kischa Altice S 12/29/2013, 8:25 AM

## 2013-12-31 ENCOUNTER — Encounter: Payer: Medicaid Other | Attending: Physical Medicine & Rehabilitation | Admitting: Physical Medicine & Rehabilitation

## 2013-12-31 ENCOUNTER — Encounter: Payer: Self-pay | Admitting: Physical Medicine & Rehabilitation

## 2013-12-31 VITALS — BP 124/77 | HR 94 | Resp 14 | Ht 73.0 in | Wt 207.0 lb

## 2013-12-31 DIAGNOSIS — E119 Type 2 diabetes mellitus without complications: Secondary | ICD-10-CM | POA: Diagnosis not present

## 2013-12-31 DIAGNOSIS — M5416 Radiculopathy, lumbar region: Secondary | ICD-10-CM | POA: Insufficient documentation

## 2013-12-31 DIAGNOSIS — I739 Peripheral vascular disease, unspecified: Secondary | ICD-10-CM | POA: Insufficient documentation

## 2013-12-31 DIAGNOSIS — Z981 Arthrodesis status: Secondary | ICD-10-CM

## 2013-12-31 DIAGNOSIS — M48061 Spinal stenosis, lumbar region without neurogenic claudication: Secondary | ICD-10-CM

## 2013-12-31 DIAGNOSIS — Z8673 Personal history of transient ischemic attack (TIA), and cerebral infarction without residual deficits: Secondary | ICD-10-CM | POA: Insufficient documentation

## 2013-12-31 DIAGNOSIS — I10 Essential (primary) hypertension: Secondary | ICD-10-CM | POA: Diagnosis not present

## 2013-12-31 DIAGNOSIS — M4806 Spinal stenosis, lumbar region: Secondary | ICD-10-CM

## 2013-12-31 MED ORDER — OXYCODONE HCL 10 MG PO TABS: 10.0000 mg | ORAL_TABLET | Freq: Three times a day (TID) | ORAL | Status: DC | PRN

## 2013-12-31 NOTE — Progress Notes (Signed)
Subjective:    Patient ID: Cory Weber, male    DOB: 08-Sep-1950, 63 y.o.   MRN: [id number]  HPI  Cory Weber is back regarding his low back and lumbar radiculopathy. He was back over at [facility] last week with an apparent infection. He was placed on doxycycline. He denies any fever. The drainage seems to have been better this week.   He has outpt therapy scheduled at [facility] which is supposed to start in early December.   He uses his walker for ambulation. He's independent with his bathing and dressing. He does get some help with house cleaning.   His pain is primarily in his low back but he's also had some occasional radiation into the right leg (that has been better.)   Pain Inventory Average Pain 8 Pain Right Now 8 My pain is constant and tingling  In the last 24 hours, has pain interfered with the following? General activity 8 Relation with others 8 Enjoyment of life 8 What TIME of day is your pain at its worst? morning Sleep (in general) Fair  Pain is worse with: bending, sitting and some activites Pain improves with: no selection Relief from Meds: 2  Mobility walk without assistance use a walker ability to climb steps?  no do you drive?  no needs help with transfers  Function not employed: date last employed . disabled: date disabled . I need assistance with the following:  bathing, household duties and shopping  Neuro/Psych weakness numbness tingling trouble walking  Prior Studies Any changes since last visit?  no  Physicians involved in your care Any changes since last visit?  no   Family History  Problem Relation Age of Onset  . Breast cancer Mother     deceased  . Cancer Mother   . Diabetes Father   . Hypertension Father   . Heart disease Father     deceased  . Hyperlipidemia Father   . Diabetes Sister   . Hypertension Sister   . Breast cancer Sister   . Colon cancer Neg Hx   . Liver disease Neg Hx    History   Social History  . Marital  Status: Single    Spouse Name: N/A    Number of Children: 1  . Years of Education: N/A   Occupational History  . trying to get disability    Social History Main Topics  . Smoking status: Current Some Day Smoker -- 0.50 packs/day for 20 years    Types: Cigarettes  . Smokeless tobacco: Never Used     Comment: given 1-800-Quit Now  . Alcohol Use: 0.0 oz/week     Comment: occ  . Drug Use: Yes    Special: Cocaine     Comment: occasionally- last used 2 or 3 weeks ago.  Marland Kitchen Sexual Activity: None   Other Topics Concern  . None   Social History Narrative   Past Surgical History  Procedure Laterality Date  . Liver biopsy  2005    Done in [place], [place]. Chronic hepatitis with mild periportal inflammation, lobular unicellular necrosis and portal fibrosis. Grade 2, stage 1-2.  Marland Kitchen Colonoscopy with propofol N/A 09/03/2012    [id number] polyp-removed as outlined above. Prominent internal hemorrhoids. Tubular adenoma  . Esophagogastroduodenoscopy (egd) with propofol N/A 09/03/2012    [id number] hernia. Gastric diverticulum. Gastric ulcers with associated erosions. Duodenal erosions. Status post gastric biopsy. H.PYLORI gastritis   . Esophageal biopsy N/A 09/03/2012    Procedure: BIOPSY;  Surgeon: Daneil Dolin,  MD;  Location: [facility];  Service: Endoscopy;  Laterality: N/A;  gastric and gastric mucosa  . Polypectomy N/A 09/03/2012    Procedure: POLYPECTOMY;  Surgeon: Daneil Dolin, MD;  Location: [facility];  Service: Endoscopy;  Laterality: N/A;  cecal polyp  . Esophagogastroduodenoscopy (egd) with propofol N/A 12/03/2012    Procedure: ESOPHAGOGASTRODUODENOSCOPY (EGD) WITH PROPOFOL;  Surgeon: Daneil Dolin, MD;  Location: [facility];  Service: Endoscopy;  Laterality: N/A;  . Esophageal biopsy N/A 12/03/2012    Procedure: BIOPSY;  Surgeon: Daneil Dolin, MD;  Location: [facility];  Service: Endoscopy;  Laterality: N/A;  . Maximum access (mas)posterior lumbar interbody fusion (plif) 1 level N/A 11/25/2013     Procedure: FOR MAXIMUM ACCESS (MAS) POSTERIOR LUMBAR INTERBODY FUSION (PLIF) 1 LEVEL;  Surgeon: Eustace Moore, MD;  Location: [facility];  Service: Neurosurgery;  Laterality: N/A;  FOR MAXIMUM ACCESS (MAS) POSTERIOR LUMBAR INTERBODY FUSION (PLIF) 1 LEVEL LUMBAR 3-4   Past Medical History  Diagnosis Date  . Diabetes mellitus   . Hypertension   . Hepatitis C     states he was diagnosed in 2007 or 2007 while living in [place], [place]  . CVA (cerebral infarction) 01/2011  . Hyperlipidemia   . Asthma   . PVD (peripheral vascular disease)   . Anemia   . Stroke   . Headache(784.0)    BP 124/77 mmHg  Pulse 94  Resp 14  Ht 6\' 1"  (1.854 m)  Wt 207 lb (93.895 kg)  BMI 27.32 kg/m2  SpO2 99%  Opioid Risk Score:   Fall Risk Score: Moderate Fall Risk (6-13 points)  Review of Systems  Constitutional:       Weight loss  Gastrointestinal: Positive for constipation.  Neurological: Positive for weakness and numbness.  All other systems reviewed and are negative.      Objective:   Physical Exam  Constitutional: He is oriented to person, place, and time. He appears well-developed and well-nourished.  HENT:  Head: Normocephalic and atraumatic.  Eyes: Conjunctivae are normal. Pupils are equal, round, and reactive to light.  Neck: Normal range of motion. Neck supple.  Cardiovascular: Normal rate and regular rhythm. No murmur Respiratory: Effort normal and breath sounds normal. No respiratory distress. He has no wheezes.  GI: Soft. Bowel sounds are normal. He exhibits no distension. There is no tenderness.  Musculoskeletal: He exhibits some tenderness in his low back with standing and basic ROM Neurological: He is alert and oriented to person, place, and time.  Follow commands without difficulty. Motor strength is 5/5 bilateral deltoid, biceps, triceps, grip  5/5 left hip flexor, knee extensor ankle dorsiflexor. RLE- 3/5 hip flexor right, 3+/5 knee extensor right, 4/5 ankle dorsi  and plantar flexor right  Sensation reduced in the right L3 dermatomal distribution . Ambulates with his RW and shuffles feet a bit. Tends to lean forward. Good balance Skin: he has some fibronecrotic tissue along center of 3cm wound. Mild serous drainage. No odor.         Assessment/Plan: 1. Functional deficits secondary to Right L3 radiculopathy with sensory as well as motor deficits    -therapy to begin in about a week or so 3. Pain Management: oxycodone prn, 10mg  q8 prn  -sign CSA today  -random UDS    4. Skin/Wound Care: Routine pressure relief measures.  -continue doxycycline  -dressing replaced -follow up with surgery 7. Fluids/Electrolytes/Nutrition: Monitor I/O for adequate hydration. Add nutritional supplement to help promote healing.    Follow  up with me in 2 months.

## 2013-12-31 NOTE — Patient Instructions (Addendum)
CALL YOUR SURGEON FOR AN APPOINTMENT TO CHECK OUT YOUR WOUND.  CALL YOUR SURGEON IF YOU BEGIN TO EXPERIENCE A FEVER OR YOUR DRAINAGE SUBSTANTIALLY INCREASES. HAVE SOMEONE CHECK OUT YOUR WOUND TO MAKE SURE THE DRAINAGE IS NOT INCREASING.

## 2014-01-01 ENCOUNTER — Other Ambulatory Visit: Payer: Self-pay | Admitting: Physical Medicine & Rehabilitation

## 2014-01-03 ENCOUNTER — Other Ambulatory Visit: Payer: Self-pay | Admitting: *Deleted

## 2014-01-03 ENCOUNTER — Telehealth: Payer: Self-pay

## 2014-01-03 NOTE — Telephone Encounter (Signed)
[facility] about the pt. She said he was interested in Hep C treatment and she wanted to know what he needed to do. She said he has not done any cocaine since April 2015 and he went to counseling in [place]. I advised her that the last time we saw him was 11/2013 and we would need him to schedule an office visit. I told her that we would need to do blood work and that he would probably need a UDS as well. He had a positive HCR RNA in April 2014 with a 1a genotype. I also checked to make sure that the pt has not been discharged from the practice, I could not find any letter or documentation of that.   She is going to call him tomorrow and let him know that he needs an office visit and have him call us.  Routing to [facility] for FYI

## 2014-01-03 NOTE — Telephone Encounter (Signed)
Correction- the last time we saw him was 11/2012

## 2014-01-04 NOTE — Telephone Encounter (Signed)
Needs office visit.

## 2014-01-04 NOTE — Telephone Encounter (Signed)
Erline Levine, if they call back, pt will need an office visit. Thanks.

## 2014-01-14 ENCOUNTER — Ambulatory Visit (HOSPITAL_COMMUNITY)
Admission: RE | Admit: 2014-01-14 | Discharge: 2014-01-14 | Disposition: A | Discharge status: Home / Self Care | Payer: Medicaid Other | Source: Ambulatory Visit | Attending: Neurological Surgery | Admitting: Neurological Surgery

## 2014-01-14 DIAGNOSIS — M6281 Muscle weakness (generalized): Secondary | ICD-10-CM | POA: Diagnosis not present

## 2014-01-14 DIAGNOSIS — M545 Low back pain: Secondary | ICD-10-CM | POA: Insufficient documentation

## 2014-01-14 DIAGNOSIS — M48061 Spinal stenosis, lumbar region without neurogenic claudication: Secondary | ICD-10-CM

## 2014-01-14 DIAGNOSIS — R262 Difficulty in walking, not elsewhere classified: Secondary | ICD-10-CM | POA: Insufficient documentation

## 2014-01-14 DIAGNOSIS — R29898 Other symptoms and signs involving the musculoskeletal system: Secondary | ICD-10-CM

## 2014-01-14 DIAGNOSIS — Z5189 Encounter for other specified aftercare: Secondary | ICD-10-CM | POA: Diagnosis not present

## 2014-01-14 DIAGNOSIS — M5416 Radiculopathy, lumbar region: Secondary | ICD-10-CM

## 2014-01-14 DIAGNOSIS — R296 Repeated falls: Secondary | ICD-10-CM

## 2014-01-14 DIAGNOSIS — Z981 Arthrodesis status: Secondary | ICD-10-CM

## 2014-01-14 NOTE — Therapy (Signed)
[facility] [address] [place], [place], [address] Phone: [phone]   Fax:  [phone]  Physical Therapy Evaluation  Patient Details  Name: Cory Weber MRN: [id number] Date of Birth: 1950-05-31  Encounter Date: 01/14/2014      PT End of Session - 01/14/14 1341    Visit Number 1   Number of Visits 16   Date for PT Re-Evaluation 02/13/14   Authorization Type Medicaid   Authorization Time Period Check Authorization   PT Start Time 1303   PT Stop Time 1340   PT Time Calculation (min) 37 min   Activity Tolerance Patient tolerated treatment well      Past Medical History  Diagnosis Date  . Diabetes mellitus   . Hypertension   . Hepatitis C     states he was diagnosed in 2007 or 2007 while living in [place], [place]  . CVA (cerebral infarction) 01/2011  . Hyperlipidemia   . Asthma   . PVD (peripheral vascular disease)   . Anemia   . Stroke   . [id number])     Past Surgical History  Procedure Laterality Date  . Liver biopsy  2005    Done in [place], [place]. Chronic hepatitis with mild periportal inflammation, lobular unicellular necrosis and portal fibrosis. Grade 2, stage 1-2.  Marland Kitchen Colonoscopy with propofol N/A 09/03/2012    [id number] polyp-removed as outlined above. Prominent internal hemorrhoids. Tubular adenoma  . Esophagogastroduodenoscopy (egd) with propofol N/A 09/03/2012    [id number] hernia. Gastric diverticulum. Gastric ulcers with associated erosions. Duodenal erosions. Status post gastric biopsy. H.PYLORI gastritis   . Esophageal biopsy N/A 09/03/2012    Procedure: BIOPSY;  Surgeon: Daneil Dolin, MD;  Location: [facility];  Service: Endoscopy;  Laterality: N/A;  gastric and gastric mucosa  . Polypectomy N/A 09/03/2012    Procedure: POLYPECTOMY;  Surgeon: Daneil Dolin, MD;  Location: [facility];  Service: Endoscopy;  Laterality: N/A;  cecal polyp  . Esophagogastroduodenoscopy (egd) with propofol N/A 12/03/2012    Procedure:  ESOPHAGOGASTRODUODENOSCOPY (EGD) WITH PROPOFOL;  Surgeon: Daneil Dolin, MD;  Location: [facility];  Service: Endoscopy;  Laterality: N/A;  . Esophageal biopsy N/A 12/03/2012    Procedure: BIOPSY;  Surgeon: Daneil Dolin, MD;  Location: [facility];  Service: Endoscopy;  Laterality: N/A;  . Maximum access (mas)posterior lumbar interbody fusion (plif) 1 level N/A 11/25/2013    Procedure: FOR MAXIMUM ACCESS (MAS) POSTERIOR LUMBAR INTERBODY FUSION (PLIF) 1 LEVEL;  Surgeon: Eustace Moore, MD;  Location: [facility];  Service: Neurosurgery;  Laterality: N/A;  FOR MAXIMUM ACCESS (MAS) POSTERIOR LUMBAR INTERBODY FUSION (PLIF) 1 LEVEL LUMBAR 3-4    There were no vitals taken for this visit.  Visit Diagnosis:  S/P lumbar spinal fusion  Acute right lumbar radiculopathy  Radiculopathy of lumbar region  Lumbar spinal stenosis  Weakness of right lower extremity  Difficulty walking  Multiple falls      Subjective Assessment - 01/14/14 1307    Symptoms Weakness in Rt LE.    Pertinent History Patient ruptured disc in Lumbar spine, Sherley Bounds performed surgery to fuse lumbar spine, patient is unsure of which level. Patient recently had home health physical therapy. October 15th was date of surgery. has since had home health phsyical therapy. Patient performed steps and walking with walker   Patient Stated Goals walking without the walker, home cleaning, and liting.    Currently in Pain? Yes   Pain Score 5    Pain Location Leg  Pain Orientation Right   Pain Descriptors / Indicators Aching;Numbness;Tightness   Pain Type Chronic pain   Pain Frequency Constant   Pain Relieving Factors gabapentin          [facility] PT Assessment - 01/14/14 0001    Assessment   Medical Diagnosis Low back pain and Rt LE weakness and aching   Onset Date 11/25/13   Next MD Visit Sherley Bounds 02/17/14   Prior Therapy Yes [facility]   Precautions   Precautions Back   Precaution Comments no bending lifting or twisting    Restrictions   Weight Bearing Restrictions No   Balance Screen   Has the patient fallen in the past 6 months Yes   How many times? 3  since surgery   Has the patient had a decrease in activity level because of a fear of falling?  Yes   Is the patient reluctant to leave their home because of a fear of falling?  Yes   [place] Private residence   Living Arrangements Alone   Type of Home Apartment   Additional Comments 3 steps to enter   Prior Function   Level of Independence Independent with basic ADLs   Cognition   Overall Cognitive Status Within Functional Limits for tasks assessed   Sensation   Additional Comments numbness and tingling in Rt LE   Strength   Right Hip Flexion --  4-/5   Right Hip Extension 2+/5   Right Hip ABduction 2+/5   Right Knee Flexion 4/5   Right Knee Extension 2-/5   Right Ankle Dorsiflexion --  4-/5   Right Ankle Plantar Flexion 2+/5   Lumbar Flexion 2+/5   Transfers   Sit to Stand Details (indicate cue type and reason) UE assist required          [facility] Adult PT Treatment/Exercise - 01/14/14 0001    Exercises   Exercises Lumbar   Lumbar Exercises: Standing   Heel Raises 10 reps   Heel Raises Limitations and toe raises at counter   Functional Squats 10 reps   Functional Squats Limitations 3D hip excursions   Lumbar Exercises: Supine   Heel Slides 10 reps   Lumbar Exercises: Sidelying   Hip Abduction 10 reps;Limitations   Lumbar Exercises: Prone   Straight Leg Raise 10 reps          PT Education - 01/14/14 1335    Education provided Yes   Education Details HEP, see exercises performed this session   Person(s) Educated Patient   Methods Explanation;Demonstration;Tactile cues;Handout   Comprehension Verbalized understanding;Returned demonstration          PT Short Term Goals - 01/14/14 1434    PT SHORT TERM GOAL #1   Title Patient will dmeosntrated increased strength in quadriceps to 3/5 MMT to be  able to sit to stand with decreased weight shift to Lt LE.    Baseline 2-/5MMT   Time 4   Period Weeks   Status New   PT SHORT TERM GOAL #2   Title Patient will demosntrate increased ankle dorsiflexion and plantar flexion strength to 3+/5 to decreased risk of toe drag and tripping during gait.    Baseline 2/5MMT   Time 4   Period Weeks   Status New   PT SHORT TERM GOAL #3   Title Patient will be able to demosntrate hip abduction strength of 3/5 MMT to be able to ambualte with decreased trendeleberg gait   Baseline 2/5MMT  Time 4   Period Weeks   Status New   PT SHORT TERM GOAL #4   Title Patient will be able to ambulate with a SPC for 45minutes   Baseline ambulates with a front wheeled walker.    Time 4   Period Weeks   Status New          PT Long Term Goals - 01/14/14 1438    PT LONG TERM GOAL #1   Title Patient will demonstrate increased strength in quadriceps to 4/5 MMT to be able to ambualte up and down stairs with 1 HHA and step through gait pattern   Time 8   Period Weeks   Status New   PT LONG TERM GOAL #2   Title Patient will demosntrate increased ankle dorsiflexion and plantar flexion strength to 4/5 be able to stand on toes to reach to high shelf.   Time 8   Period Weeks   Status New   PT LONG TERM GOAL #3   Title Patient will be able to demonstrate hip extension strength of 4/5 and demsontrate 5x sit to stand without UE support in <15 seconds to indicate patient not at high risk for falls.    Time 8   Period Weeks   Status New   PT LONG TERM GOAL #4   Title Patient will be able to ambulate with out an assistive device for 30 minutes to independently perform ADLs.    Time 8   Period Weeks   Status New          Plan - 01/14/14 1342    Clinical Impression Statement Patient demosntrates Rt LE with ness s/p Lumbar spine fusion to correct Lumbar disc pathology with patient continuing to have numbness and tinging in Rt LE with quadriceps and ankle  dorsiflexors dmeosntrating the most significant continued strength limitations. Patient will benefit from skilled physical therapy to increase LE strength and return to regular walking and ADL's withtou difficulty.  Current session focused on education of patient on exercises for HEP while maintaining low back precautions.    Pt will benefit from skilled therapeutic intervention in order to improve on the following deficits Abnormal gait;Decreased strength;Pain;Difficulty walking;Decreased activity tolerance;Decreased range of motion;Decreased balance   Rehab Potential Good   PT Frequency 2x / week   PT Duration 8 weeks   PT Treatment/Interventions Therapeutic exercise;Balance training;Neuromuscular re-education;Passive range of motion;Gait training;Patient/family education;Manual techniques;Therapeutic activities;Stair training   PT Next Visit Plan Focus to be on increasign LE strength and stability for gait, stairs, and balance.    PT Home Exercise Plan Given, see exercises performed this seession.    Consulted and Agree with Plan of Care Patient        Problem List Patient Active Problem List   Diagnosis Date Noted  . Acute blood loss anemia 12/01/2013  . Constipation 12/01/2013  . Radiculopathy of lumbar region 11/29/2013  . Fever presenting with conditions classified elsewhere 11/27/2013  . Acute right lumbar radiculopathy 11/26/2013  . S/P lumbar spinal fusion 11/25/2013  . Lumbar spinal stenosis 11/24/2013  . Right leg weakness 11/23/2013  . PUD (peptic ulcer disease) 11/19/2012  . IDA (iron deficiency anemia) 07/10/2012  . Chronic hepatitis C 05/13/2012  . Unspecified constipation 05/13/2012  . Peripheral vascular disease, unspecified 01/21/2012  . Hepatitis C antibody test positive 12/03/2011  . Normocytic anemia 12/03/2011  . Melena 12/03/2011  . DM (diabetes mellitus), type 2, uncontrolled 02/03/2011  . Accelerated hypertension 02/03/2011  . Hemiparesthesia 02/03/2011   .  Polysubstance abuse 02/03/2011  . Tobacco abuse 02/03/2011    Devona Konig PT DPT [phone]

## 2014-01-14 NOTE — Patient Instructions (Signed)
Heel SlideAnkle Plantar Flexion / Dorsiflexion, Standing .  Copyright  VHI. All rights reserved.    Bend left knee and pull heel toward buttocks. Repeat 10 times. Do 3 sessions per day.  http://gt2.exer.us/300   Abduction   Lift leg up toward ceiling. Return.  Repeat 10 times each leg. Do 3 sessions per day.  http://gt2.exer.us/385   Copyright  VHI. All rights reserved.  Extension Lift   Keeping knee locked, tighten buttock muscles to raise leg Hold 3 seconds. Repeat 10 times. Do 3 sessions per day.  Copyright  VHI. All rights reserved.    Stand while holding a stable object. Rise up on toes. Then rock back on heels.  Repeat 10 times (build up to 20) per session. Do 3 sessions per day Functional Quadriceps: Sit to Stand   Sit on edge of chair, feet flat on floor. Stand upright, extending knees fully. Repeat 5 times per set. Do 2  sets per session. Do 3 sessions per day.  http://orth.exer.us/734   Copyright  VHI. All rights reserved.

## 2014-01-19 ENCOUNTER — Encounter (HOSPITAL_COMMUNITY): Payer: Self-pay | Admitting: *Deleted

## 2014-01-19 ENCOUNTER — Emergency Department (HOSPITAL_COMMUNITY): Payer: Medicaid Other

## 2014-01-19 ENCOUNTER — Emergency Department (HOSPITAL_COMMUNITY)
Admission: EM | Admit: 2014-01-19 | Discharge: 2014-01-19 | Disposition: A | Discharge status: Home / Self Care | Payer: Medicaid Other | Attending: Emergency Medicine | Admitting: Emergency Medicine

## 2014-01-19 DIAGNOSIS — E785 Hyperlipidemia, unspecified: Secondary | ICD-10-CM | POA: Insufficient documentation

## 2014-01-19 DIAGNOSIS — W1839XA Other fall on same level, initial encounter: Secondary | ICD-10-CM | POA: Diagnosis not present

## 2014-01-19 DIAGNOSIS — J45909 Unspecified asthma, uncomplicated: Secondary | ICD-10-CM | POA: Diagnosis not present

## 2014-01-19 DIAGNOSIS — Z8673 Personal history of transient ischemic attack (TIA), and cerebral infarction without residual deficits: Secondary | ICD-10-CM | POA: Insufficient documentation

## 2014-01-19 DIAGNOSIS — Z720 Tobacco use: Secondary | ICD-10-CM | POA: Diagnosis not present

## 2014-01-19 DIAGNOSIS — S8991XA Unspecified injury of right lower leg, initial encounter: Secondary | ICD-10-CM | POA: Insufficient documentation

## 2014-01-19 DIAGNOSIS — Z8619 Personal history of other infectious and parasitic diseases: Secondary | ICD-10-CM | POA: Diagnosis not present

## 2014-01-19 DIAGNOSIS — E119 Type 2 diabetes mellitus without complications: Secondary | ICD-10-CM | POA: Diagnosis not present

## 2014-01-19 DIAGNOSIS — Y998 Other external cause status: Secondary | ICD-10-CM | POA: Diagnosis not present

## 2014-01-19 DIAGNOSIS — Z79899 Other long term (current) drug therapy: Secondary | ICD-10-CM | POA: Insufficient documentation

## 2014-01-19 DIAGNOSIS — R52 Pain, unspecified: Secondary | ICD-10-CM

## 2014-01-19 DIAGNOSIS — I10 Essential (primary) hypertension: Secondary | ICD-10-CM | POA: Insufficient documentation

## 2014-01-19 DIAGNOSIS — Y9289 Other specified places as the place of occurrence of the external cause: Secondary | ICD-10-CM | POA: Diagnosis not present

## 2014-01-19 DIAGNOSIS — D649 Anemia, unspecified: Secondary | ICD-10-CM | POA: Insufficient documentation

## 2014-01-19 DIAGNOSIS — Z7982 Long term (current) use of aspirin: Secondary | ICD-10-CM | POA: Diagnosis not present

## 2014-01-19 DIAGNOSIS — M25561 Pain in right knee: Secondary | ICD-10-CM

## 2014-01-19 DIAGNOSIS — Y9301 Activity, walking, marching and hiking: Secondary | ICD-10-CM | POA: Diagnosis not present

## 2014-01-19 DIAGNOSIS — Z792 Long term (current) use of antibiotics: Secondary | ICD-10-CM | POA: Diagnosis not present

## 2014-01-19 DIAGNOSIS — I739 Peripheral vascular disease, unspecified: Secondary | ICD-10-CM | POA: Diagnosis not present

## 2014-01-19 DIAGNOSIS — W19XXXA Unspecified fall, initial encounter: Secondary | ICD-10-CM

## 2014-01-19 MED ORDER — OXYCODONE-ACETAMINOPHEN 5-325 MG PO TABS
1.0000 | ORAL_TABLET | Freq: Once | ORAL | Status: AC
  Administered 2014-01-19: 1 via ORAL
  Filled 2014-01-19: qty 1

## 2014-01-19 MED ORDER — OXYCODONE-ACETAMINOPHEN 5-325 MG PO TABS: 1.0000 | ORAL_TABLET | ORAL | Status: DC | PRN

## 2014-01-19 NOTE — Discharge Instructions (Signed)
Knee Pain Knee pain can be a result of an injury or other medical conditions. Treatment will depend on the cause of your pain. HOME CARE  Only take medicine as told by your doctor.  Keep a healthy weight. Being overweight can make the knee hurt more.  Stretch before exercising or playing sports.  If there is constant knee pain, change the way you exercise. Ask your doctor for advice.  Make sure shoes fit well. Choose the right shoe for the sport or activity.  Protect your knees. Wear kneepads if needed.  Rest when you are tired. GET HELP RIGHT AWAY IF:   Your knee pain does not stop.  Your knee pain does not get better.  Your knee joint feels hot to the touch.  You have a fever. MAKE SURE YOU:   Understand these instructions.  Will watch this condition.  Will get help right away if you are not doing well or get worse. Document Released: 04/26/2008 Document Revised: 04/22/2011 Document Reviewed: 04/26/2008 [facility] Patient Information 2015 [place], [place]. This information is not intended to replace advice given to you by your health care provider. Make sure you discuss any questions you have with your health care provider.

## 2014-01-19 NOTE — ED Notes (Signed)
Pt verbalized understanding of no driving and to use caution within 4 hours of taking pain meds due to meds cause drowsiness 

## 2014-01-19 NOTE — ED Notes (Signed)
Pt states he fell yesterday, stating, "My knee just gave out on me" PT c/o pain to right knee and unable to bear weight.

## 2014-01-20 ENCOUNTER — Ambulatory Visit (HOSPITAL_COMMUNITY): Payer: Medicaid Other | Admitting: Physical Therapy

## 2014-01-20 ENCOUNTER — Telehealth: Payer: Self-pay | Admitting: *Deleted

## 2014-01-20 NOTE — Telephone Encounter (Signed)
Pt called requesting refill on pain medication. Pt was informed that he needs to make an appt,. MaryBeth made an appt for patient 01/24/2014 with DrMarland Kitchen Naaman Plummer

## 2014-01-22 NOTE — ED Provider Notes (Signed)
CSN: [id number]     Arrival date & time 01/19/14  [id number] History   First MD Initiated Contact with Patient 01/19/14 1016     Chief Complaint  Patient presents with  . Knee Pain     (Consider location/radiation/quality/duration/timing/severity/associated sxs/prior Treatment) HPI  Cory Weber is a 63 y.o. male who presents to the Emergency Department complaining of right knee pain after a fall.  States that he was walking and his right knee "gave out" which caused him to fall , twisting his knee.  Pain is worse with weight bearing and full extension.  Improves at rest.  He denies other injuries or swelling.  He also denies numbness of the leg, fever or redness.he has not tried any medications prior to arrival.     Past Medical History  Diagnosis Date  . Diabetes mellitus   . Hypertension   . Hepatitis C     states he was diagnosed in 2007 or 2007 while living in [place], [place]  . CVA (cerebral infarction) 01/2011  . Hyperlipidemia   . Asthma   . PVD (peripheral vascular disease)   . Anemia   . Stroke   . [id number])    Past Surgical History  Procedure Laterality Date  . Liver biopsy  2005    Done in [place], [place]. Chronic hepatitis with mild periportal inflammation, lobular unicellular necrosis and portal fibrosis. Grade 2, stage 1-2.  Marland Kitchen Colonoscopy with propofol N/A 09/03/2012    [id number] polyp-removed as outlined above. Prominent internal hemorrhoids. Tubular adenoma  . Esophagogastroduodenoscopy (egd) with propofol N/A 09/03/2012    [id number] hernia. Gastric diverticulum. Gastric ulcers with associated erosions. Duodenal erosions. Status post gastric biopsy. H.PYLORI gastritis   . Esophageal biopsy N/A 09/03/2012    Procedure: BIOPSY;  Surgeon: Daneil Dolin, MD;  Location: [facility];  Service: Endoscopy;  Laterality: N/A;  gastric and gastric mucosa  . Polypectomy N/A 09/03/2012    Procedure: POLYPECTOMY;  Surgeon: Daneil Dolin, MD;  Location: [facility];  Service:  Endoscopy;  Laterality: N/A;  cecal polyp  . Esophagogastroduodenoscopy (egd) with propofol N/A 12/03/2012    Procedure: ESOPHAGOGASTRODUODENOSCOPY (EGD) WITH PROPOFOL;  Surgeon: Daneil Dolin, MD;  Location: [facility];  Service: Endoscopy;  Laterality: N/A;  . Esophageal biopsy N/A 12/03/2012    Procedure: BIOPSY;  Surgeon: Daneil Dolin, MD;  Location: [facility];  Service: Endoscopy;  Laterality: N/A;  . Maximum access (mas)posterior lumbar interbody fusion (plif) 1 level N/A 11/25/2013    Procedure: FOR MAXIMUM ACCESS (MAS) POSTERIOR LUMBAR INTERBODY FUSION (PLIF) 1 LEVEL;  Surgeon: Eustace Moore, MD;  Location: [facility];  Service: Neurosurgery;  Laterality: N/A;  FOR MAXIMUM ACCESS (MAS) POSTERIOR LUMBAR INTERBODY FUSION (PLIF) 1 LEVEL LUMBAR 3-4   Family History  Problem Relation Age of Onset  . Breast cancer Mother     deceased  . Cancer Mother   . Diabetes Father   . Hypertension Father   . Heart disease Father     deceased  . Hyperlipidemia Father   . Diabetes Sister   . Hypertension Sister   . Breast cancer Sister   . Colon cancer Neg Hx   . Liver disease Neg Hx    History  Substance Use Topics  . Smoking status: Current Some Day Smoker -- 0.50 packs/day for 20 years    Types: Cigarettes  . Smokeless tobacco: Never Used     Comment: given 1-800-Quit Now  . Alcohol Use: 0.0 oz/week  Comment: occ    Review of Systems  Constitutional: Negative for fever and chills.  Genitourinary: Negative for dysuria and difficulty urinating.  Musculoskeletal: Positive for arthralgias (right knee pain). Negative for joint swelling.  Skin: Negative for color change, rash and wound.  All other systems reviewed and are negative.     Allergies  Review of patient's allergies indicates no known allergies.  Home Medications   Prior to Admission medications   Medication Sig Start Date End Date Taking? Authorizing Provider  amLODipine (NORVASC) 5 MG tablet Take 1 tablet (5 mg  total) by mouth daily. 12/03/13   Lavon Paganini Angiulli, PA-C  aspirin EC 81 MG tablet Take 81 mg by mouth daily.    Historical Provider, MD  doxycycline (VIBRAMYCIN) 100 MG capsule Take 1 capsule (100 mg total) by mouth 2 (two) times daily. 12/27/13   Veryl Speak, MD  esomeprazole (NEXIUM) 40 MG capsule TAKE ONE CAPSULE BY MOUTH DAILY BEFORE BREAKFAST. 12/03/13   Lavon Paganini Angiulli, PA-C  ferrous fumarate (HEMOCYTE - 106 MG FE) 325 (106 FE) MG TABS tablet Take 1 tablet (106 mg of iron total) by mouth 2 (two) times daily. Patient not taking: Reported on 12/27/2013 12/03/13   Lavon Paganini Angiulli, PA-C  gabapentin (NEURONTIN) 300 MG capsule Take 1 capsule (300 mg total) by mouth 2 (two) times daily. 12/03/13   Lavon Paganini Angiulli, PA-C  HYDROcodone-acetaminophen (NORCO) 5-325 MG per tablet Take 1-2 tablets by mouth every 6 (six) hours as needed. 12/27/13   Veryl Speak, MD  Linaclotide Rolan Lipa) 290 MCG CAPS capsule Take 1 capsule (290 mcg total) by mouth daily. 30 minutes before breakfast. 12/03/13   Lavon Paganini Angiulli, PA-C  lisinopril-hydrochlorothiazide (PRINZIDE,ZESTORETIC) 20-12.5 MG per tablet Take 1 tablet by mouth 2 (two) times daily. 12/03/13   Lavon Paganini Angiulli, PA-C  metFORMIN (GLUCOPHAGE) 1000 MG tablet Take 1 tablet (1,000 mg total) by mouth 2 (two) times daily with a meal. 12/03/13   Lavon Paganini Angiulli, PA-C  methocarbamol (ROBAXIN) 500 MG tablet Take 1 tablet (500 mg total) by mouth every 6 (six) hours as needed for muscle spasms. Patient taking differently: Take 500 mg by mouth 2 (two) times daily as needed for muscle spasms.  12/03/13   Lavon Paganini Angiulli, PA-C  nicotine (NICODERM CQ - DOSED IN MG/24 HOURS) 14 mg/24hr patch 14 mg patch daily x2 weeks then 7 mg patch daily x3 weeks and stop Patient not taking: Reported on 12/27/2013 12/03/13   Lavon Paganini Angiulli, PA-C  nicotine (NICODERM CQ - DOSED IN MG/24 HR) 7 mg/24hr patch Place 7 mg onto the skin daily. Starting 12/17/2013 x 3 weeks.     Historical Provider, MD  Oxycodone HCl 10 MG TABS Take 1 tablet (10 mg total) by mouth every 8 (eight) hours as needed (pain). 12/31/13   Meredith Staggers, MD  oxyCODONE-acetaminophen (PERCOCET/ROXICET) 5-325 MG per tablet Take 1 tablet by mouth every 4 (four) hours as needed. 01/19/14   Gianmarco Roye L. Pallie Swigert, PA-C  simvastatin (ZOCOR) 20 MG tablet Take 1 tablet (20 mg total) by mouth daily at 6 PM. 12/03/13   Lavon Paganini Angiulli, PA-C   BP 158/77 mmHg  Pulse 100  Temp(Src) 98.1 F (36.7 C) (Oral)  Resp 16  Ht 6\' 1"  (1.854 m)  Wt 207 lb (93.895 kg)  BMI 27.32 kg/m2  SpO2 100% Physical Exam  Constitutional: He is oriented to person, place, and time. He appears well-developed and well-nourished. No distress.  Cardiovascular: Normal rate, regular rhythm, normal  heart sounds and intact distal pulses.   Pulmonary/Chest: Effort normal and breath sounds normal. No respiratory distress.  Musculoskeletal: Normal range of motion. He exhibits tenderness. He exhibits no edema.  Diffuse ttp of the anterior right knee.  No erythema, effusion, or step-off deformity.  DP pulse brisk, distal sensation intact. Calf is soft and NT.  Neurological: He is alert and oriented to person, place, and time. He exhibits normal muscle tone. Coordination normal.  Skin: Skin is warm and dry. No erythema.  Nursing note and vitals reviewed.   ED Course  Procedures (including critical care time) Labs Review Labs Reviewed - No data to display  Imaging Review No results found.   EKG Interpretation None      MDM   Final diagnoses:  Right knee pain    Pt with right knee pain after a fall that involved a twisting injury.  XR neg for fx.  No obvious effusion or obvious ligament instability on exam.  No septic joint.  Pt agrees to elevate, ice and close ortho f/u.  rx for percocet, knee immob and crutches given.      Antinette Keough L. Vanessa Romeo, PA-C 01/22/14 [place], MD 01/24/14 1356

## 2014-01-24 ENCOUNTER — Ambulatory Visit: Payer: Medicaid Other | Admitting: Physical Medicine & Rehabilitation

## 2014-01-24 ENCOUNTER — Ambulatory Visit (HOSPITAL_COMMUNITY)
Admission: RE | Admit: 2014-01-24 | Discharge: 2014-01-24 | Disposition: A | Discharge status: Home / Self Care | Payer: Medicaid Other | Source: Ambulatory Visit | Attending: Family Medicine | Admitting: Family Medicine

## 2014-01-24 DIAGNOSIS — R262 Difficulty in walking, not elsewhere classified: Secondary | ICD-10-CM

## 2014-01-24 DIAGNOSIS — M48061 Spinal stenosis, lumbar region without neurogenic claudication: Secondary | ICD-10-CM

## 2014-01-24 DIAGNOSIS — Z5189 Encounter for other specified aftercare: Secondary | ICD-10-CM | POA: Diagnosis not present

## 2014-01-24 DIAGNOSIS — R296 Repeated falls: Secondary | ICD-10-CM

## 2014-01-24 DIAGNOSIS — R29898 Other symptoms and signs involving the musculoskeletal system: Secondary | ICD-10-CM

## 2014-01-24 DIAGNOSIS — Z981 Arthrodesis status: Secondary | ICD-10-CM

## 2014-01-24 DIAGNOSIS — M5416 Radiculopathy, lumbar region: Secondary | ICD-10-CM

## 2014-01-24 NOTE — Therapy (Signed)
[facility] [address] [place], [place], [address] Phone: [phone]   Fax:  [phone]  Physical Therapy Treatment  Patient Details  Name: Cory Weber MRN: [id number] Date of Birth: July 24, 1950  Encounter Date: 01/24/2014      PT End of Session - 01/24/14 1243    Visit Number 2   Number of Visits 16   Date for PT Re-Evaluation 02/13/14   PT Start Time 1146   PT Stop Time 1226   PT Time Calculation (min) 40 min   Activity Tolerance Patient tolerated treatment well      Past Medical History  Diagnosis Date  . Diabetes mellitus   . Hypertension   . Hepatitis C     states he was diagnosed in 2007 or 2007 while living in [place], [place]  . CVA (cerebral infarction) 01/2011  . Hyperlipidemia   . Asthma   . PVD (peripheral vascular disease)   . Anemia   . Stroke   . [id number]     Past Surgical History  Procedure Laterality Date  . Liver biopsy  2005    Done in [place], [place]. Chronic hepatitis with mild periportal inflammation, lobular unicellular necrosis and portal fibrosis. Grade 2, stage 1-2.  Marland Kitchen Colonoscopy with propofol N/A 09/03/2012    [id number] polyp-removed as outlined above. Prominent internal hemorrhoids. Tubular adenoma  . Esophagogastroduodenoscopy (egd) with propofol N/A 09/03/2012    [id number] hernia. Gastric diverticulum. Gastric ulcers with associated erosions. Duodenal erosions. Status post gastric biopsy. H.PYLORI gastritis   . Esophageal biopsy N/A 09/03/2012    Procedure: BIOPSY;  Surgeon: Daneil Dolin, MD;  Location: [facility];  Service: Endoscopy;  Laterality: N/A;  gastric and gastric mucosa  . Polypectomy N/A 09/03/2012    Procedure: POLYPECTOMY;  Surgeon: Daneil Dolin, MD;  Location: [facility];  Service: Endoscopy;  Laterality: N/A;  cecal polyp  . Esophagogastroduodenoscopy (egd) with propofol N/A 12/03/2012    Procedure: ESOPHAGOGASTRODUODENOSCOPY (EGD) WITH PROPOFOL;  Surgeon: Daneil Dolin, MD;   Location: [facility];  Service: Endoscopy;  Laterality: N/A;  . Esophageal biopsy N/A 12/03/2012    Procedure: BIOPSY;  Surgeon: Daneil Dolin, MD;  Location: [facility];  Service: Endoscopy;  Laterality: N/A;  . Maximum access (mas)posterior lumbar interbody fusion (plif) 1 level N/A 11/25/2013    Procedure: FOR MAXIMUM ACCESS (MAS) POSTERIOR LUMBAR INTERBODY FUSION (PLIF) 1 LEVEL;  Surgeon: Eustace Moore, MD;  Location: [facility] [facility];  Service: Neurosurgery;  Laterality: N/A;  FOR MAXIMUM ACCESS (MAS) POSTERIOR LUMBAR INTERBODY FUSION (PLIF) 1 LEVEL LUMBAR 3-4    There were no vitals taken for this visit.  Visit Diagnosis:  S/P lumbar spinal fusion  Radiculopathy of lumbar region  Lumbar spinal stenosis  Weakness of right lower extremity  Difficulty walking  Multiple falls      Subjective Assessment - 01/24/14 1248    Symptoms Pt had a fall on 12/08 and went to ED.  Pt reports he had x-rays done and (-) for fractures; pt was given a knee brace to wear as needed and pain medication.  Pt reports he has been wearing the knee brace for ambulation outside the home.           [facility] PT Assessment - 01/24/14 1149    Assessment   Medical Diagnosis Low back pain and Rt LE weakness and aching   Onset Date 11/25/13   Next MD Visit Sherley Bounds 02/17/14   Precautions   Precautions Back  Precaution Comments no bending lifting or twisting          [facility] Adult PT Treatment/Exercise - 01/24/14 1146    Exercises   Exercises Lumbar;Knee/Hip   Lumbar Exercises: Stretches   Active Hamstring Stretch --   Active Hamstring Stretch Limitations --   Passive Hamstring Stretch --   Passive Hamstring Stretch Limitations --   Lumbar Exercises: Standing   Heel Raises 15 reps  0 HHA   Heel Raises Limitations Toe Raises x15, with 1 HHA   Functional Squats 20 reps   Functional Squats Limitations min assist o shift body weight to the Rt LE   Lumbar Exercises: Seated   Long Arc Quad on Chair 10  reps;AAROM;Right   Lumbar Exercises: Supine   Heel Slides 20 reps   Heel Slides Limitations 1#   Bridge 10 reps   Lumbar Exercises: Sidelying   Hip Abduction 15 reps   Hip Abduction Limitations tactile cueing for technique to avoid rolling/hip flexion   Lumbar Exercises: Prone   Straight Leg Raise 15 reps   Other Prone Lumbar Exercises HS Curls 2# 2x10 Rt LE   Knee/Hip Exercises: Stretches   Active Hamstring Stretch 2 reps;30 seconds   Active Hamstring Stretch Limitations 14" box, (B) LE   Quad Stretch 2 reps;30 seconds   Quad Stretch Limitations Prone with rope, (B) LE   Piriformis Stretch 2 reps;30 seconds   Piriformis Stretch Limitations Seated, (B) LE   Gastroc Stretch 2 reps;30 seconds   Gastroc Stretch Limitations Slantboard   Knee/Hip Exercises: Standing   Lateral Step Up 10 reps;Hand Hold: 1;Step Height: 4";Right   Forward Step Up 10 reps;Hand Hold: 1;Step Height: 4";Right          PT Education - 01/24/14 1242    Education provided Yes   Education Details Work on sit -> stand with equal WB   Person(s) Educated Patient   Methods Explanation;Demonstration   Comprehension Verbalized understanding;Returned demonstration          PT Short Term Goals - 01/24/14 1247    PT SHORT TERM GOAL #1   Title Patient will dmeosntrated increased strength in quadriceps to 3/5 MMT to be able to sit to stand with decreased weight shift to Lt LE.    Status On-going   PT SHORT TERM GOAL #2   Title Patient will demosntrate increased ankle dorsiflexion and plantar flexion strength to 3+/5 to decreased risk of toe drag and tripping during gait.    Status On-going   PT SHORT TERM GOAL #3   Title Patient will be able to demosntrate hip abduction strength of 3/5 MMT to be able to ambualte with decreased trendeleberg gait   Status On-going          PT Long Term Goals - 01/24/14 1247    PT LONG TERM GOAL #3   Title Patient will be able to demonstrate hip extension strength of 4/5 and  demsontrate 5x sit to stand without UE support in <15 seconds to indicate patient not at high risk for falls.    Status On-going          Plan - 01/24/14 1243    Clinical Impression Statement PT POC initiated, working on flexibility program for (B) LE and strengthening program for Rt LE.  Pt did have a fall since initial evaluation, and was checked out by the ED and given knee brace to wear; no follow up for knee brace or instructions on when to D/C.  Noted significant  weakness in Rt quad musculature, with pt locking knee or hyperextending Rt knee during step up exercise and when transferring sit -> stand .  Pt unable to complete SLR or SAQ secondary to weakness, and did require assist for LAQ for the Rt LE to complete through full ROM. Verbal cueing throughout therapeutic exericses for strengthening for controlled movement to avoid uncontrolled descend for standing and mat exercises.     Pt will benefit from skilled therapeutic intervention in order to improve on the following deficits Abnormal gait;Decreased strength;Pain;Difficulty walking;Decreased activity tolerance;Decreased range of motion;Decreased balance   Rehab Potential Good   PT Frequency 2x / week   PT Duration 2 weeks   PT Treatment/Interventions Therapeutic exercise;Balance training;Neuromuscular re-education;Passive range of motion;Gait training;Patient/family education;Manual techniques;Therapeutic activities;Stair training   PT Next Visit Plan Focus to be on increasing LE strength with exercises, maintaining equal WB on Rt and Lt LE to increase stability for gait, stairs, and balance.          Problem List Patient Active Problem List   Diagnosis Date Noted  . Acute blood loss anemia 12/01/2013  . Constipation 12/01/2013  . Radiculopathy of lumbar region 11/29/2013  . Fever presenting with conditions classified elsewhere 11/27/2013  . Acute right lumbar radiculopathy 11/26/2013  . S/P lumbar spinal fusion 11/25/2013  .  Lumbar spinal stenosis 11/24/2013  . Right leg weakness 11/23/2013  . PUD (peptic ulcer disease) 11/19/2012  . IDA (iron deficiency anemia) 07/10/2012  . Chronic hepatitis C 05/13/2012  . Unspecified constipation 05/13/2012  . Peripheral vascular disease, unspecified 01/21/2012  . Hepatitis C antibody test positive 12/03/2011  . Normocytic anemia 12/03/2011  . Melena 12/03/2011  . DM (diabetes mellitus), type 2, uncontrolled 02/03/2011  . Accelerated hypertension 02/03/2011  . Hemiparesthesia 02/03/2011  . Polysubstance abuse 02/03/2011  . Tobacco abuse 02/03/2011   Lonna Cobb, DPT [phone]

## 2014-01-26 ENCOUNTER — Ambulatory Visit (HOSPITAL_COMMUNITY): Payer: Medicaid Other

## 2014-01-28 ENCOUNTER — Other Ambulatory Visit: Payer: Self-pay | Admitting: *Deleted

## 2014-01-28 DIAGNOSIS — M48061 Spinal stenosis, lumbar region without neurogenic claudication: Secondary | ICD-10-CM

## 2014-01-28 DIAGNOSIS — Z981 Arthrodesis status: Secondary | ICD-10-CM

## 2014-01-28 DIAGNOSIS — M5416 Radiculopathy, lumbar region: Secondary | ICD-10-CM

## 2014-01-28 MED ORDER — OXYCODONE HCL 10 MG PO TABS: 10.0000 mg | ORAL_TABLET | Freq: Three times a day (TID) | ORAL | Status: DC | PRN

## 2014-01-28 NOTE — Telephone Encounter (Signed)
rx for oxycodone ordered for MD to sign  for refill visit with RN 01/31/14 .  CSA was signed 12/31/13 (no uds collected) and he has already gotten percocet # 15 from another provider. See [facility] CSR  Please advise.

## 2014-01-31 ENCOUNTER — Other Ambulatory Visit: Payer: Self-pay | Admitting: Physical Medicine & Rehabilitation

## 2014-01-31 ENCOUNTER — Encounter: Payer: Medicaid Other | Attending: Physical Medicine & Rehabilitation | Admitting: *Deleted

## 2014-01-31 ENCOUNTER — Encounter: Payer: Self-pay | Admitting: *Deleted

## 2014-01-31 VITALS — BP 132/87 | HR 99

## 2014-01-31 DIAGNOSIS — M5416 Radiculopathy, lumbar region: Secondary | ICD-10-CM | POA: Insufficient documentation

## 2014-01-31 DIAGNOSIS — G894 Chronic pain syndrome: Secondary | ICD-10-CM

## 2014-01-31 DIAGNOSIS — M48061 Spinal stenosis, lumbar region without neurogenic claudication: Secondary | ICD-10-CM

## 2014-01-31 DIAGNOSIS — I739 Peripheral vascular disease, unspecified: Secondary | ICD-10-CM | POA: Insufficient documentation

## 2014-01-31 DIAGNOSIS — Z981 Arthrodesis status: Secondary | ICD-10-CM

## 2014-01-31 DIAGNOSIS — I10 Essential (primary) hypertension: Secondary | ICD-10-CM | POA: Insufficient documentation

## 2014-01-31 DIAGNOSIS — Z5181 Encounter for therapeutic drug level monitoring: Secondary | ICD-10-CM

## 2014-01-31 DIAGNOSIS — Z8673 Personal history of transient ischemic attack (TIA), and cerebral infarction without residual deficits: Secondary | ICD-10-CM | POA: Insufficient documentation

## 2014-01-31 DIAGNOSIS — Z79899 Other long term (current) drug therapy: Secondary | ICD-10-CM

## 2014-01-31 DIAGNOSIS — E119 Type 2 diabetes mellitus without complications: Secondary | ICD-10-CM | POA: Insufficient documentation

## 2014-01-31 MED ORDER — METHOCARBAMOL 500 MG PO TABS: 500.0000 mg | ORAL_TABLET | Freq: Four times a day (QID) | ORAL | Status: DC | PRN

## 2014-01-31 NOTE — Progress Notes (Signed)
Mr Cory Weber is here for med refill.  He did not bring his bottle today.  He does not want to give a urine for UDS today because he smoked marijuana Saturday.  He says he is out of his medication.  I reviewed his NCCSR which shows he has received total of #140 oxycodone 10 mg for the month of November from combination of Dr Sherley Bounds and Dr Naaman Plummer.  He also received # 15 hydrocodone from Dr Veryl Speak 12/27/13 and # 15 percocet from Dr Montine Circle in the Ed 12/9.  He signed a Controled Substance Contract with our clinic 12/31/13 at his appt with Dr Naaman Plummer.  I have given him a warning about how he has now 3 strikes against him. #1 not bringing his bottle, # 2 receiving medication from other prescribers, and # 3 smoking marijuana which is an illegal substance.  He claims he did not know but I have printed a copy of the contract signed with our clinic highlighting the rules stating no  narc meds from other providers, no illegal substances, must bring bottle.  He is not wanting to give a UDS today and reschedule his appt due to the marijuana not wanting it on his record.  I have told him this is not possible.  He must give urine sample today or be discharged.  I have also given him the warning that depending on his UDS results, Dr Naaman Plummer may no longer prescribe for him and this may be his final rx from this clinic.  He understands this.  He had 2 bottles of water and did give a urine sample and we will wait for results to determine if he continues as a patient at this clinic.  He has a clear set of rules and understands if he violates them he will be discharged going forward.  He has an appt scheduled to return to see Dr Naaman Plummer 03/02/14.  He understands that he is only given #60 pills though directions read may take q 8 hrs prn but this must last the month.  I have refilled his methocarbamol once electronically.  Depending on UDS results he will follow up 03/02/14 with Naaman Plummer.  He has had the fall that took  him to  The ED but no other falls or travel outside of the [place]. He understands that though he is encouraged to seek tx in the ED if he is injured or having an emergency but may not take a prescription from the MD.

## 2014-02-01 ENCOUNTER — Ambulatory Visit (HOSPITAL_COMMUNITY): Payer: Medicaid Other | Admitting: Physical Therapy

## 2014-02-01 LAB — PMP ALCOHOL METABOLITE (ETG): Ethyl Glucuronide (EtG): NEGATIVE ng/mL

## 2014-02-05 LAB — COCAINE METABOLITE (GC/LC/MS), URINE: BENZOYLECGONINE GC/MS CONF: 2596 ng/mL — AB (ref <100)

## 2014-02-09 ENCOUNTER — Ambulatory Visit (HOSPITAL_COMMUNITY)
Admission: RE | Admit: 2014-02-09 | Discharge: 2014-02-09 | Disposition: A | Discharge status: Home / Self Care | Payer: Medicaid Other | Source: Ambulatory Visit | Attending: Family Medicine | Admitting: Family Medicine

## 2014-02-09 DIAGNOSIS — R296 Repeated falls: Secondary | ICD-10-CM

## 2014-02-09 DIAGNOSIS — Z981 Arthrodesis status: Secondary | ICD-10-CM

## 2014-02-09 DIAGNOSIS — R262 Difficulty in walking, not elsewhere classified: Secondary | ICD-10-CM

## 2014-02-09 DIAGNOSIS — Z5189 Encounter for other specified aftercare: Secondary | ICD-10-CM | POA: Diagnosis not present

## 2014-02-09 DIAGNOSIS — M5416 Radiculopathy, lumbar region: Secondary | ICD-10-CM

## 2014-02-09 DIAGNOSIS — R29898 Other symptoms and signs involving the musculoskeletal system: Secondary | ICD-10-CM

## 2014-02-09 DIAGNOSIS — M48061 Spinal stenosis, lumbar region without neurogenic claudication: Secondary | ICD-10-CM

## 2014-02-09 LAB — PRESCRIPTION MONITORING PROFILE (SOLSTAS)
Amphetamine/Meth: NEGATIVE ng/mL
BENZODIAZEPINE SCREEN, URINE: NEGATIVE ng/mL
BUPRENORPHINE, URINE: NEGATIVE ng/mL
Barbiturate Screen, Urine: NEGATIVE ng/mL
CARISOPRODOL, URINE: NEGATIVE ng/mL
Cannabinoid Scrn, Ur: NEGATIVE ng/mL
Creatinine, Urine: 151.51 mg/dL (ref >20.0)
FENTANYL URINE: NEGATIVE ng/mL
MDMA URINE: NEGATIVE ng/mL
Meperidine, Ur: NEGATIVE ng/mL
Methadone Screen, Urine: NEGATIVE ng/mL
Nitrites, Initial: NEGATIVE ug/mL
OPIATE SCREEN, URINE: NEGATIVE ng/mL
Oxycodone Screen, Ur: NEGATIVE ng/mL
PH URINE, INITIAL: 5.8 pH (ref 4.5–8.9)
Propoxyphene: NEGATIVE ng/mL
Tapentadol, urine: NEGATIVE ng/mL
Tramadol Scrn, Ur: NEGATIVE ng/mL
ZOLPIDEM, URINE: NEGATIVE ng/mL

## 2014-02-09 NOTE — Therapy (Signed)
[facility], [place], [address] Phone: [phone]   Fax:  [phone]  Physical Therapy Treatment  Patient Details  Name: Cory Weber MRN: [id number] Date of Birth: [birth year]  Encounter Date: 02/09/2014      PT End of Session - 02/09/14 1548    Visit Number 3   Number of Visits 16   Date for PT Re-Evaluation 02/13/14   Authorization Type Medicaid   Authorization Time Period only 3 visits   Authorization - Visit Number 3   Authorization - Number of Visits 3   PT Start Time 5003   PT Stop Time 1600   PT Time Calculation (min) 45 min   Activity Tolerance Patient tolerated treatment well      Past Medical History  Diagnosis Date  . Diabetes mellitus   . Hypertension   . Hepatitis C     states he was diagnosed in 2007 or 2007 while living in [place], [place]  . CVA (cerebral infarction) 01/2011  . Hyperlipidemia   . Asthma   . PVD (peripheral vascular disease)   . Anemia   . Stroke   . [id number](694.5)     Past Surgical History  Procedure Laterality Date  . Liver biopsy  2005    Done in [place], [place]. Chronic hepatitis with mild periportal inflammation, lobular unicellular necrosis and portal fibrosis. Grade 2, stage 1-2.  Marland Kitchen Colonoscopy with propofol N/A 09/03/2012    [id number] polyp-removed as outlined above. Prominent internal hemorrhoids. Tubular adenoma  . Esophagogastroduodenoscopy (egd) with propofol N/A 09/03/2012    [id number] hernia. Gastric diverticulum. Gastric ulcers with associated erosions. Duodenal erosions. Status post gastric biopsy. H.PYLORI gastritis   . Esophageal biopsy N/A 09/03/2012    Procedure: BIOPSY;  Surgeon: Daneil Dolin, MD;  Location: [facility];  Service: Endoscopy;  Laterality: N/A;  gastric and gastric mucosa  . Polypectomy N/A 09/03/2012    Procedure: POLYPECTOMY;  Surgeon: Daneil Dolin, MD;  Location: [facility];  Service: Endoscopy;  Laterality: N/A;  cecal polyp  .  Esophagogastroduodenoscopy (egd) with propofol N/A 12/03/2012    Procedure: ESOPHAGOGASTRODUODENOSCOPY (EGD) WITH PROPOFOL;  Surgeon: Daneil Dolin, MD;  Location: [facility];  Service: Endoscopy;  Laterality: N/A;  . Esophageal biopsy N/A 12/03/2012    Procedure: BIOPSY;  Surgeon: Daneil Dolin, MD;  Location: [facility];  Service: Endoscopy;  Laterality: N/A;  . Maximum access (mas)posterior lumbar interbody fusion (plif) 1 level N/A 11/25/2013    Procedure: FOR MAXIMUM ACCESS (MAS) POSTERIOR LUMBAR INTERBODY FUSION (PLIF) 1 LEVEL;  Surgeon: Eustace Moore, MD;  Location: [facility];  Service: Neurosurgery;  Laterality: N/A;  FOR MAXIMUM ACCESS (MAS) POSTERIOR LUMBAR INTERBODY FUSION (PLIF) 1 LEVEL LUMBAR 3-4    There were no vitals taken for this visit.  Visit Diagnosis:  S/P lumbar spinal fusion  Radiculopathy of lumbar region  Lumbar spinal stenosis  Weakness of right lower extremity  Difficulty walking  Multiple falls      Subjective Assessment - 02/09/14 1513    Symptoms Patient states he has been feeling well withtou any pain, "no pain its just bearable."    Currently in Pain? No/denies          [facility] PT Assessment - 02/09/14 0001    Strength   Right Hip Flexion 4/5   Right Hip Extension 3+/5   Right Hip ABduction 3-/5   Right Knee Flexion 4/5   Right Knee Extension 3+/5   Right  Ankle Plantar Flexion 3+/5   Lumbar Flexion 3+/5                  [facility] Adult PT Treatment/Exercise - 02/09/14 0001    Lumbar Exercises: Standing   Heel Raises 15 reps  0 HHA   Heel Raises Limitations Toe Raises x15, with 1 HHA   Functional Squats 10 reps   Functional Squats Limitations min assist o shift body weight to the Rt LE   Forward Lunge 10 reps   Forward Lunge Limitations to 6"   Side Lunge 10 reps   Side Lunge Limitations to 6"    Row 20 reps;Strengthening   Other Standing Lumbar Exercises Slipt stance 3D hip excursions 10x each.    Knee/Hip Exercises: Stretches    Active Hamstring Stretch 2 reps;30 seconds   Active Hamstring Stretch Limitations 14" box, (B) LE   Quad Stretch 2 reps;30 seconds   Quad Stretch Limitations Prone with rope, (B) LE   Hip Flexor Stretch 20 seconds;2 reps   Hip Flexor Stretch Limitations 6" step,    Piriformis Stretch 2 reps;30 seconds   Piriformis Stretch Limitations Seated, (B) LE   Gastroc Stretch 2 reps;30 seconds   Gastroc Stretch Limitations Slantboard                  PT Short Term Goals - 02/09/14 1558    PT SHORT TERM GOAL #1   Title Patient will dmeosntrated increased strength in quadriceps to 3/5 MMT to be able to sit to stand with decreased weight shift to Lt LE.    Status Achieved   PT SHORT TERM GOAL #2   Title Patient will demosntrate increased ankle dorsiflexion and plantar flexion strength to 3+/5 to decreased risk of toe drag and tripping during gait.    Status Achieved   PT SHORT TERM GOAL #3   Title Patient will be able to demosntrate hip abduction strength of 3/5 MMT to be able to ambualte with decreased trendeleberg gait   Status Partially Met   PT SHORT TERM GOAL #4   Title Patient will be able to ambulate with a SPC for 21mnutes   Status Not Met           PT Long Term Goals - 02/09/14 1558    PT LONG TERM GOAL #1   Title Patient will demonstrate increased strength in quadriceps to 4/5 MMT to be able to ambualte up and down stairs with 1 HHA and step through gait pattern   Status Not Met   PT LONG TERM GOAL #2   Title Patient will demosntrate increased ankle dorsiflexion and plantar flexion strength to 4/5 be able to stand on toes to reach to high shelf.   Status Not Met   PT LONG TERM GOAL #3   Title Patient will be able to demonstrate hip extension strength of 4/5 and demsontrate 5x sit to stand without UE support in <15 seconds to indicate patient not at high risk for falls.    Status Not Met   PT LONG TERM GOAL #4   Title Patient will be able to ambulate with out an  assistive device for 30 minutes to independently perform ADLs.    Status Not Met               Plan - 02/09/14 1549    Clinical Impression Statement Patient is compliant with HEP and dmeonstrated independence with all HEP exerrcises thoguh he conitnues to have difficulty performing sit  to stand to and from a chair secondary to limited hip and knee strength. with Rt LE particularrly limited.  patient dispalsy fucntional waekness on Rt LE resulting in inablility to perform single elg stand >2 seconds, iand in ability to walk, lunge aor perform stais and squats withtou UE assistance. Patient would benefit from continued skille dphsyical therapy to increase LE strength and tability but is unable to  financially afford continued therapy. Patient discharged with HEP for continued independent progression due to financial constraints.    PT Next Visit Plan patient discharged with HEP.         Problem List Patient Active Problem List   Diagnosis Date Noted  . Acute blood loss anemia 12/01/2013  . Constipation 12/01/2013  . Radiculopathy of lumbar region 11/29/2013  . Fever presenting with conditions classified elsewhere 11/27/2013  . Acute right lumbar radiculopathy 11/26/2013  . S/P lumbar spinal fusion 11/25/2013  . Lumbar spinal stenosis 11/24/2013  . Right leg weakness 11/23/2013  . PUD (peptic ulcer disease) 11/19/2012  . IDA (iron deficiency anemia) 07/10/2012  . Chronic hepatitis C 05/13/2012  . Unspecified constipation 05/13/2012  . Peripheral vascular disease, unspecified 01/21/2012  . Hepatitis C antibody test positive 12/03/2011  . Normocytic anemia 12/03/2011  . Melena 12/03/2011  . DM (diabetes mellitus), type 2, uncontrolled 02/03/2011  . Accelerated hypertension 02/03/2011  . Hemiparesthesia 02/03/2011  . Polysubstance abuse 02/03/2011  . Tobacco abuse 02/03/2011   Devona Konig PT DPT [facility] [address] [place], [place], [address] Phone: [phone]   Fax:  [phone]

## 2014-02-14 ENCOUNTER — Ambulatory Visit (HOSPITAL_COMMUNITY): Payer: Medicaid Other | Admitting: Physical Therapy

## 2014-02-16 ENCOUNTER — Ambulatory Visit (HOSPITAL_COMMUNITY): Payer: Medicaid Other | Admitting: Physical Therapy

## 2014-02-16 NOTE — Progress Notes (Signed)
Mr Cory Weber urine drug screen from this encounter was not positive for marijuana for which he reported, but positive for cocaine.

## 2014-02-21 ENCOUNTER — Encounter (HOSPITAL_COMMUNITY): Payer: Medicaid Other | Admitting: Physical Therapy

## 2014-02-23 ENCOUNTER — Encounter (HOSPITAL_COMMUNITY): Payer: Medicaid Other | Admitting: Physical Therapy

## 2014-02-23 ENCOUNTER — Telehealth: Payer: Self-pay | Admitting: *Deleted

## 2014-02-23 NOTE — Telephone Encounter (Signed)
Cory Weber who saw me last month for RN med refill visit and didn't want to take his UDS because he had smoked marijuana but when UDS came back it was positive for cocaine.  He has an appt 03/02/14 with you.  Do you want to discharge or see him?

## 2014-02-24 ENCOUNTER — Encounter (HOSPITAL_COMMUNITY): Payer: Self-pay | Admitting: Internal Medicine

## 2014-02-28 ENCOUNTER — Encounter (HOSPITAL_COMMUNITY): Payer: Medicaid Other | Admitting: Physical Therapy

## 2014-02-28 NOTE — Telephone Encounter (Signed)
Resending this to you. He has an appt 03/02/14 and if you are no longer going to see or prescribe he needs to be notified before he shows up for appt. See previous message about cocaine in UDS

## 2014-02-28 NOTE — Telephone Encounter (Signed)
Non-narcotic mgt only

## 2014-03-01 NOTE — Telephone Encounter (Signed)
Noted in FYI section

## 2014-03-02 ENCOUNTER — Encounter (HOSPITAL_COMMUNITY): Payer: Medicaid Other | Admitting: Physical Therapy

## 2014-03-02 ENCOUNTER — Encounter: Payer: Medicaid Other | Attending: Physical Medicine & Rehabilitation | Admitting: Physical Medicine & Rehabilitation

## 2014-03-02 DIAGNOSIS — I10 Essential (primary) hypertension: Secondary | ICD-10-CM | POA: Insufficient documentation

## 2014-03-02 DIAGNOSIS — Z8673 Personal history of transient ischemic attack (TIA), and cerebral infarction without residual deficits: Secondary | ICD-10-CM | POA: Insufficient documentation

## 2014-03-02 DIAGNOSIS — M5416 Radiculopathy, lumbar region: Secondary | ICD-10-CM | POA: Insufficient documentation

## 2014-03-02 DIAGNOSIS — I739 Peripheral vascular disease, unspecified: Secondary | ICD-10-CM | POA: Insufficient documentation

## 2014-03-02 DIAGNOSIS — E119 Type 2 diabetes mellitus without complications: Secondary | ICD-10-CM | POA: Insufficient documentation

## 2014-03-07 ENCOUNTER — Encounter (HOSPITAL_COMMUNITY): Payer: Medicaid Other | Admitting: Physical Therapy

## 2014-03-09 ENCOUNTER — Encounter (HOSPITAL_COMMUNITY): Payer: Medicaid Other | Admitting: Physical Therapy

## 2014-04-01 ENCOUNTER — Emergency Department (HOSPITAL_COMMUNITY): Payer: Medicaid Other

## 2014-04-01 ENCOUNTER — Encounter (HOSPITAL_COMMUNITY): Payer: Self-pay | Admitting: *Deleted

## 2014-04-01 ENCOUNTER — Emergency Department (HOSPITAL_COMMUNITY)
Admission: EM | Admit: 2014-04-01 | Discharge: 2014-04-01 | Disposition: A | Discharge status: Home / Self Care | Payer: Medicaid Other | Attending: Emergency Medicine | Admitting: Emergency Medicine

## 2014-04-01 DIAGNOSIS — Z8673 Personal history of transient ischemic attack (TIA), and cerebral infarction without residual deficits: Secondary | ICD-10-CM | POA: Insufficient documentation

## 2014-04-01 DIAGNOSIS — Z7982 Long term (current) use of aspirin: Secondary | ICD-10-CM | POA: Insufficient documentation

## 2014-04-01 DIAGNOSIS — K859 Acute pancreatitis, unspecified: Secondary | ICD-10-CM | POA: Insufficient documentation

## 2014-04-01 DIAGNOSIS — Z720 Tobacco use: Secondary | ICD-10-CM | POA: Insufficient documentation

## 2014-04-01 DIAGNOSIS — E119 Type 2 diabetes mellitus without complications: Secondary | ICD-10-CM | POA: Insufficient documentation

## 2014-04-01 DIAGNOSIS — J45909 Unspecified asthma, uncomplicated: Secondary | ICD-10-CM | POA: Diagnosis not present

## 2014-04-01 DIAGNOSIS — E785 Hyperlipidemia, unspecified: Secondary | ICD-10-CM | POA: Insufficient documentation

## 2014-04-01 DIAGNOSIS — D649 Anemia, unspecified: Secondary | ICD-10-CM | POA: Diagnosis not present

## 2014-04-01 DIAGNOSIS — Z8619 Personal history of other infectious and parasitic diseases: Secondary | ICD-10-CM | POA: Diagnosis not present

## 2014-04-01 DIAGNOSIS — M2550 Pain in unspecified joint: Secondary | ICD-10-CM | POA: Diagnosis not present

## 2014-04-01 DIAGNOSIS — Z79899 Other long term (current) drug therapy: Secondary | ICD-10-CM | POA: Insufficient documentation

## 2014-04-01 DIAGNOSIS — I10 Essential (primary) hypertension: Secondary | ICD-10-CM | POA: Diagnosis not present

## 2014-04-01 DIAGNOSIS — R1010 Upper abdominal pain, unspecified: Secondary | ICD-10-CM | POA: Diagnosis present

## 2014-04-01 DIAGNOSIS — R079 Chest pain, unspecified: Secondary | ICD-10-CM | POA: Diagnosis not present

## 2014-04-01 DIAGNOSIS — R109 Unspecified abdominal pain: Secondary | ICD-10-CM

## 2014-04-01 LAB — CBC WITH DIFFERENTIAL/PLATELET
BASOS ABS: 0 10*3/uL (ref 0.0–0.1)
BASOS PCT: 0 % (ref 0–1)
EOS PCT: 1 % (ref 0–5)
Eosinophils Absolute: 0.1 10*3/uL (ref 0.0–0.7)
HCT: 36.7 % — ABNORMAL LOW (ref 39.0–52.0)
HEMOGLOBIN: 12.2 g/dL — AB (ref 13.0–17.0)
Lymphocytes Relative: 24 % (ref 12–46)
Lymphs Abs: 2.1 10*3/uL (ref 0.7–4.0)
MCH: 27.8 pg (ref 26.0–34.0)
MCHC: 33.2 g/dL (ref 30.0–36.0)
MCV: 83.6 fL (ref 78.0–100.0)
MONOS PCT: 7 % (ref 3–12)
Monocytes Absolute: 0.6 10*3/uL (ref 0.1–1.0)
NEUTROS ABS: 6 10*3/uL (ref 1.7–7.7)
NEUTROS PCT: 68 % (ref 43–77)
Platelets: 354 10*3/uL (ref 150–400)
RBC: 4.39 MIL/uL (ref 4.22–5.81)
RDW: 13.7 % (ref 11.5–15.5)
WBC: 8.9 10*3/uL (ref 4.0–10.5)

## 2014-04-01 LAB — COMPREHENSIVE METABOLIC PANEL
ALBUMIN: 4.9 g/dL (ref 3.5–5.2)
ALK PHOS: 70 U/L (ref 39–117)
ALT: 21 U/L (ref 0–53)
ANION GAP: 9 (ref 5–15)
AST: 19 U/L (ref 0–37)
BUN: 19 mg/dL (ref 6–23)
CO2: 27 mmol/L (ref 19–32)
Calcium: 10.2 mg/dL (ref 8.4–10.5)
Chloride: 94 mmol/L — ABNORMAL LOW (ref 96–112)
Creatinine, Ser: 1.27 mg/dL (ref 0.50–1.35)
GFR calc Af Amer: 69 mL/min — ABNORMAL LOW (ref >90)
GFR calc non Af Amer: 59 mL/min — ABNORMAL LOW (ref >90)
Glucose, Bld: 273 mg/dL — ABNORMAL HIGH (ref 70–99)
POTASSIUM: 4.9 mmol/L (ref 3.5–5.1)
Sodium: 130 mmol/L — ABNORMAL LOW (ref 135–145)
Total Bilirubin: 0.5 mg/dL (ref 0.3–1.2)
Total Protein: 9.2 g/dL — ABNORMAL HIGH (ref 6.0–8.3)

## 2014-04-01 LAB — LIPASE, BLOOD: Lipase: 159 U/L — ABNORMAL HIGH (ref 11–59)

## 2014-04-01 LAB — TROPONIN I: Troponin I: <0.03 ng/mL (ref <0.031)

## 2014-04-01 MED ORDER — SUCRALFATE 1 GM/10ML PO SUSP: 1.0000 g | Freq: Three times a day (TID) | ORAL | Status: DC

## 2014-04-01 MED ORDER — IOHEXOL 300 MG/ML  SOLN
100.0000 mL | Freq: Once | INTRAMUSCULAR | Status: AC | PRN
  Administered 2014-04-01: 100 mL via INTRAVENOUS

## 2014-04-01 MED ORDER — HYDROCODONE-ACETAMINOPHEN 5-325 MG PO TABS: 2.0000 | ORAL_TABLET | ORAL | Status: DC | PRN

## 2014-04-01 MED ORDER — IOHEXOL 300 MG/ML  SOLN
50.0000 mL | Freq: Once | INTRAMUSCULAR | Status: AC | PRN
  Administered 2014-04-01: 50 mL via ORAL

## 2014-04-01 MED ORDER — IOHEXOL 240 MG/ML SOLN: 50.0000 mL | Freq: Once | INTRAMUSCULAR | Status: DC | PRN

## 2014-04-01 NOTE — Discharge Instructions (Signed)

## 2014-04-01 NOTE — ED Notes (Signed)
Says he "can't eat" hurts in chest when he eats.  Pain upper medial thigh, pain lt arm after fell last night,  Rt hand numb intermittently.  Alert, talking.

## 2014-04-01 NOTE — ED Provider Notes (Signed)
CSN: [id number]     Arrival date & time 04/01/14  1230 History   First MD Initiated Contact with Patient 04/01/14 1453     No chief complaint on file.    (Consider location/radiation/quality/duration/timing/severity/associated sxs/prior Treatment) HPI Comments: Patient presents to the ER for evaluation of multiple problems. Patient is here primarily because of discomfort in his chest and upper abdomen. He reports that he has a pain in this area after he eats. He feels like food is getting caught when he swallows. He is feeling like he gets nauseated after eating as well. It's worse when he lies down at night to try to sleep. This has been ongoing for about a week. He reports that he has been having trouble sleeping because of this, has been taking extra Neurontin to help him sleep, has now run out.  Patient complaining of increased pain in his legs. Pain is primarily on the right side, runs down his leg. He reports that he has "nerve damage" from his diabetes. He does gotten worse since he stopped taking the Neurontin.  She also reports that he fell yesterday. No head injury or loss of consciousness. He is experiencing pain in the left shoulder after the fall.   Past Medical History  Diagnosis Date  . Diabetes mellitus   . Hypertension   . Hepatitis C     states he was diagnosed in 2007 or 2007 while living in [place], [place]  . CVA (cerebral infarction) 01/2011  . Hyperlipidemia   . Asthma   . PVD (peripheral vascular disease)   . Anemia   . Stroke   . [id number]    Past Surgical History  Procedure Laterality Date  . Liver biopsy  2005    Done in [place], [place]. Chronic hepatitis with mild periportal inflammation, lobular unicellular necrosis and portal fibrosis. Grade 2, stage 1-2.  Marland Kitchen Colonoscopy with propofol N/A 09/03/2012    [id number] polyp-removed as outlined above. Prominent internal hemorrhoids. Tubular adenoma  . Esophagogastroduodenoscopy (egd) with propofol N/A  09/03/2012    [id number] hernia. Gastric diverticulum. Gastric ulcers with associated erosions. Duodenal erosions. Status post gastric biopsy. H.PYLORI gastritis   . Esophageal biopsy N/A 09/03/2012    Procedure: BIOPSY;  Surgeon: Daneil Dolin, MD;  Location: [facility];  Service: Endoscopy;  Laterality: N/A;  gastric and gastric mucosa  . Polypectomy N/A 09/03/2012    Procedure: POLYPECTOMY;  Surgeon: Daneil Dolin, MD;  Location: [facility];  Service: Endoscopy;  Laterality: N/A;  cecal polyp  . Esophagogastroduodenoscopy (egd) with propofol N/A 12/03/2012    Procedure: ESOPHAGOGASTRODUODENOSCOPY (EGD) WITH PROPOFOL;  Surgeon: Daneil Dolin, MD;  Location: [facility];  Service: Endoscopy;  Laterality: N/A;  . Esophageal biopsy N/A 12/03/2012    Procedure: BIOPSY;  Surgeon: Daneil Dolin, MD;  Location: [facility];  Service: Endoscopy;  Laterality: N/A;  . Maximum access (mas)posterior lumbar interbody fusion (plif) 1 level N/A 11/25/2013    Procedure: FOR MAXIMUM ACCESS (MAS) POSTERIOR LUMBAR INTERBODY FUSION (PLIF) 1 LEVEL;  Surgeon: Eustace Moore, MD;  Location: [facility];  Service: Neurosurgery;  Laterality: N/A;  FOR MAXIMUM ACCESS (MAS) POSTERIOR LUMBAR INTERBODY FUSION (PLIF) 1 LEVEL LUMBAR 3-4   Family History  Problem Relation Age of Onset  . Breast cancer Mother     deceased  . Cancer Mother   . Diabetes Father   . Hypertension Father   . Heart disease Father     deceased  . Hyperlipidemia Father   .  Diabetes Sister   . Hypertension Sister   . Breast cancer Sister   . Colon cancer Neg Hx   . Liver disease Neg Hx    History  Substance Use Topics  . Smoking status: Current Some Day Smoker -- 0.50 packs/day for 20 years    Types: Cigarettes  . Smokeless tobacco: Never Used     Comment: given 1-800-Quit Now  . Alcohol Use: 0.0 oz/week     Comment: occ    Review of Systems  Respiratory: Negative for shortness of breath.   Cardiovascular: Positive for chest pain.   Gastrointestinal: Positive for nausea and abdominal pain.  Musculoskeletal: Positive for arthralgias.  Neurological: Positive for numbness.  All other systems reviewed and are negative.     Allergies  Review of patient's allergies indicates no known allergies.  Home Medications   Prior to Admission medications   Medication Sig Start Date End Date Taking? Authorizing Provider  amLODipine (NORVASC) 5 MG tablet Take 1 tablet (5 mg total) by mouth daily. 12/03/13  Yes Daniel J Angiulli, PA-C  aspirin EC 81 MG tablet Take 81 mg by mouth daily.   Yes Historical Provider, MD  esomeprazole (NEXIUM) 40 MG capsule TAKE ONE CAPSULE BY MOUTH DAILY BEFORE BREAKFAST. 12/03/13  Yes Daniel J Angiulli, PA-C  ferrous sulfate 325 (65 FE) MG tablet Take 325 mg by mouth 2 (two) times daily.   Yes Historical Provider, MD  Linaclotide (LINZESS) 290 MCG CAPS capsule Take 1 capsule (290 mcg total) by mouth daily. 30 minutes before breakfast. 12/03/13  Yes Daniel J Angiulli, PA-C  lisinopril-hydrochlorothiazide (PRINZIDE,ZESTORETIC) 20-12.5 MG per tablet Take 1 tablet by mouth 2 (two) times daily. 12/03/13  Yes Daniel J Angiulli, PA-C  metFORMIN (GLUCOPHAGE) 1000 MG tablet Take 1 tablet (1,000 mg total) by mouth 2 (two) times daily with a meal. 12/03/13  Yes Daniel J Angiulli, PA-C  methocarbamol (ROBAXIN) 500 MG tablet Take 1 tablet (500 mg total) by mouth every 6 (six) hours as needed for muscle spasms. 01/31/14  Yes Meredith Staggers, MD  simvastatin (ZOCOR) 20 MG tablet Take 1 tablet (20 mg total) by mouth daily at 6 PM. 12/03/13  Yes Lavon Paganini Angiulli, PA-C  gabapentin (NEURONTIN) 300 MG capsule Take 1 capsule (300 mg total) by mouth 2 (two) times daily. 12/03/13   Lavon Paganini Angiulli, PA-C  HYDROcodone-acetaminophen (NORCO/VICODIN) 5-325 MG per tablet Take 2 tablets by mouth every 4 (four) hours as needed for moderate pain. 04/01/14   Orpah Greek, MD  sucralfate (CARAFATE) 1 GM/10ML suspension Take 10  mLs (1 g total) by mouth 4 (four) times daily -  with meals and at bedtime. 04/01/14   Orpah Greek, MD   BP 150/109 mmHg  Pulse 84  Temp(Src) 97.6 F (36.4 C) (Oral)  Resp 20  Ht 6\' 1"  (1.854 m)  Wt 200 lb (90.719 kg)  BMI 26.39 kg/m2  SpO2 100% Physical Exam  Constitutional: He is oriented to person, place, and time. He appears well-developed and well-nourished. No distress.  HENT:  Head: Normocephalic and atraumatic.  Right Ear: Hearing normal.  Left Ear: Hearing normal.  Nose: Nose normal.  Mouth/Throat: Oropharynx is clear and moist and mucous membranes are normal.  Eyes: Conjunctivae and EOM are normal. Pupils are equal, round, and reactive to light.  Neck: Normal range of motion. Neck supple.  Cardiovascular: Regular rhythm, S1 normal and S2 normal.  Exam reveals no gallop and no friction rub.   No murmur heard. Pulmonary/Chest:  Effort normal and breath sounds normal. No respiratory distress. He exhibits no tenderness.  Abdominal: Soft. Normal appearance and bowel sounds are normal. There is no hepatosplenomegaly. There is no tenderness. There is no rebound, no guarding, no tenderness at McBurney's point and negative Murphy's sign. No hernia.  Musculoskeletal: Normal range of motion.  Neurological: He is alert and oriented to person, place, and time. He has normal strength. No cranial nerve deficit or sensory deficit. Coordination normal. GCS eye subscore is 4. GCS verbal subscore is 5. GCS motor subscore is 6.  Skin: Skin is warm, dry and intact. No rash noted. No cyanosis.  Psychiatric: He has a normal mood and affect. His speech is normal and behavior is normal. Thought content normal.  Nursing note and vitals reviewed.   ED Course  Procedures (including critical care time) Labs Review Labs Reviewed  CBC WITH DIFFERENTIAL/PLATELET - Abnormal; Notable for the following:    Hemoglobin 12.2 (*)    HCT 36.7 (*)    All other components within normal limits   COMPREHENSIVE METABOLIC PANEL - Abnormal; Notable for the following:    Sodium 130 (*)    Chloride 94 (*)    Glucose, Bld 273 (*)    Total Protein 9.2 (*)    GFR calc non Af Amer 59 (*)    GFR calc Af Amer 69 (*)    All other components within normal limits  LIPASE, BLOOD - Abnormal; Notable for the following:    Lipase 159 (*)    All other components within normal limits  TROPONIN I    Imaging Review Dg Chest 2 View  04/01/2014   CLINICAL DATA:  Chest pain at night and after eating  EXAM: CHEST  2 VIEW  COMPARISON:  November 27, 2013  FINDINGS: Lungs are clear. Heart size and pulmonary vascularity are normal. No pneumothorax. No adenopathy. No bone lesions.  IMPRESSION: No edema or consolidation.   Electronically Signed   By: Lowella Grip III M.D.   On: 04/01/2014 13:23   Ct Abdomen Pelvis W Contrast  04/01/2014   CLINICAL DATA:  Abdominal pain  EXAM: CT ABDOMEN AND PELVIS WITH CONTRAST  TECHNIQUE: Multidetector CT imaging of the abdomen and pelvis was performed using the standard protocol following bolus administration of intravenous contrast.  CONTRAST:  46mL OMNIPAQUE IOHEXOL 300 MG/ML SOLN, 162mL OMNIPAQUE IOHEXOL 300 MG/ML SOLN  COMPARISON:  11/27/2013  FINDINGS: Sagittal images of the spine shows postsurgical with posterior metallic fusion at [id number] level.  The lung bases are unremarkable. Atherosclerotic calcifications are noted abdominal aorta and iliac arteries. No aortic aneurysm.  Gallbladder is contracted without evidence of calcified gallstones. The liver is unremarkable. Pancreas, spleen and adrenal glands are unremarkable. There is a small diverticulum in proximal stomach just anterior to left kidney measures about 2.5 cm containing oral contrast material. There is no evidence of acute complication.  Kidneys are symmetrical in size and enhancement. No hydronephrosis or hydroureter.  Delayed renal images shows bilateral renal symmetrical excretion. Bilateral visualized proximal  ureter is unremarkable.  No small bowel obstruction.  No ascites or free air.  No adenopathy.  There is no pericecal inflammation. Normal appendix clearly visualized axial image 58. Moderate stool noted in sigmoid colon and rectum. The rectum is mild distended with stool measures 6.2 cm in diameter. Prostate gland measures 4.2 by 4.3 cm. Nonspecific mild thickening of urinary bladder wall. Clinical correlation is necessary to exclude mild cystitis. Degenerative changes bilateral SI joints. No destructive bony lesions  are noted within pelvis. Degenerative changes pubic symphysis.  IMPRESSION: 1. There is no acute inflammatory process within abdomen. 2. There is a small diverticulum in proximal stomach without evidence of acute complication measures 2.5 cm. 3. No hydronephrosis or hydroureter. 4. Normal appendix.  No pericecal inflammation. 5. Nonspecific mild thickening of urinary bladder wall. Clinical correlation is necessary to exclude mild cystitis. 6. Postsurgical changes with posterior metallic fusion lumbar spine at L3-L4 level.   Electronically Signed   By: Lahoma Crocker M.D.   On: 04/01/2014 19:21     EKG Interpretation   Date/Time:  Friday April 01 2014 15:09:06 EST Ventricular Rate:  81 PR Interval:  164 QRS Duration: 94 QT Interval:  368 QTC Calculation: 427 R Axis:   87 Text Interpretation:  Sinus rhythm Borderline right axis deviation  Nonspecific ST and T wave abnormality Baseline wander in lead(s) II III  aVF V5 No significant change since last tracing Confirmed by Alie Moudy  MD,  [place] [phone]) on 04/01/2014 5:02:41 PM      MDM   Final diagnoses:  Abdominal pain  Acute pancreatitis, unspecified pancreatitis type    Patient presented to the ER with multiple complaints. He is experiencing chest discomfort and abdominal discomfort, both of which occur when he eats. He has a mildly elevated lipase consistent with pancreatitis. CT scan was performed of the abdomen, no acute  pathology noted. Patient is comfortable, does not require hospitalization. He was given analgesia and return precautions. Otherwise he will follow-up with his GI specialist, Dr. work. Clear liquid diet today, advance slowly.    Orpah Greek, MD 04/02/14 [phone]

## 2014-04-01 NOTE — ED Notes (Signed)
Pt states he started having right arm pain and numbness and right pain shooting from groin area into the lower leg starting 2-3 days ago. Pt states he is having chest pain after eating and its hard to keep his food down. Pt states he has been having trouble sleeping and has been taking extra gabapentin to help him sleep. Therefor, patient has run out of medication roughly 2-3 days ago. Can't get a refill until March 1.   NAD noted.    MD at bedside

## 2014-04-01 NOTE — ED Notes (Signed)
Pt alert & oriented x4, stable gait. Patient given discharge instructions, paperwork & prescription(s). Patient informed not to drive, operate any equipment & handel any important documents 4 hours after taking pain medication. Patient  instructed to stop at the registration desk to finish any additional paperwork. Patient  verbalized understanding. Pt left department in wheelchair w/ no further questions. 

## 2014-04-01 NOTE — ED Notes (Signed)
Pt states feeling better, had a good bowel movement. Pt has sent family out for [place] food.

## 2014-04-08 ENCOUNTER — Ambulatory Visit: Payer: Medicaid Other | Admitting: Physical Medicine & Rehabilitation

## 2014-04-12 ENCOUNTER — Other Ambulatory Visit: Payer: Self-pay | Admitting: Physical Medicine & Rehabilitation

## 2014-04-13 ENCOUNTER — Ambulatory Visit (INDEPENDENT_AMBULATORY_CARE_PROVIDER_SITE_OTHER): Payer: Medicaid Other | Admitting: Gastroenterology

## 2014-04-13 ENCOUNTER — Other Ambulatory Visit: Payer: Self-pay

## 2014-04-13 ENCOUNTER — Encounter: Payer: Self-pay | Admitting: Gastroenterology

## 2014-04-13 VITALS — BP 126/81 | HR 99 | Temp 97.3°F | Ht 73.0 in | Wt 201.8 lb

## 2014-04-13 DIAGNOSIS — R1013 Epigastric pain: Secondary | ICD-10-CM

## 2014-04-13 MED ORDER — SUCRALFATE 1 GM/10ML PO SUSP: 1.0000 g | Freq: Four times a day (QID) | ORAL | Status: DC

## 2014-04-13 NOTE — Patient Instructions (Signed)
I sent in a medication called carafate to take 4 times a day. Increase your Nexium to twice a day, 30 minutes before breakfast and dinner.   We have scheduled you for an upper endoscopy with Dr. Gala Romney. You may need further evaluation if the upper endoscopy is negative.   You will also need to have a urine drug screen. This will need to be negative before the procedure.

## 2014-04-13 NOTE — Progress Notes (Signed)
Referring Provider: Iona Beard, MD Primary Care Physician:  Maggie Font, MD  Primary GI: Dr. Gala Romney   Chief Complaint  Patient presents with  . Abdominal Pain  . Constipation    HPI:   Cory Weber is a 64 y.o. male presenting today with a history of chronic Hepatitis C, genotype 1a, history of cocaine use, acute blood loss anemia requiring blood transfusion in 2014, PUD secondary to H.pylori gastritis in 2014 with follow-up EGD noting healed gastric mucosa and negative H.pylori. Immune to Hep A and B. Referred to [facility] for consideration of Hep C treatment in 2014. I have requested notes. No treatment commenced.   Last cocaine use in Dec, nasal. Lots of belching. Epigastric discomfort noted. Intermittent. No NSAIDs or aspirin powders. Used epsom salts the other day. Uncomfortable with eating. Some improvement since Feb. No ETOH.   Past Medical History  Diagnosis Date  . Diabetes mellitus   . Hypertension   . Hepatitis C     states he was diagnosed in 2007 or 2007 while living in [place], [place]  . CVA (cerebral infarction) 01/2011  . Hyperlipidemia   . Asthma   . PVD (peripheral vascular disease)   . Anemia   . Stroke   . [id number]     Past Surgical History  Procedure Laterality Date  . Liver biopsy  2005    Done in [place], [place]. Chronic hepatitis with mild periportal inflammation, lobular unicellular necrosis and portal fibrosis. Grade 2, stage 1-2.  Marland Kitchen Colonoscopy with propofol N/A 09/03/2012    [id number] polyp-removed as outlined above. Prominent internal hemorrhoids. Tubular adenoma  . Esophagogastroduodenoscopy (egd) with propofol N/A 09/03/2012    [id number] hernia. Gastric diverticulum. Gastric ulcers with associated erosions. Duodenal erosions. Status post gastric biopsy. H.PYLORI gastritis   . Esophageal biopsy N/A 09/03/2012    Procedure: BIOPSY;  Surgeon: Daneil Dolin, MD;  Location: [facility];  Service: Endoscopy;  Laterality: N/A;  gastric  and gastric mucosa  . Polypectomy N/A 09/03/2012    Procedure: POLYPECTOMY;  Surgeon: Daneil Dolin, MD;  Location: [facility];  Service: Endoscopy;  Laterality: N/A;  cecal polyp  . Esophagogastroduodenoscopy (egd) with propofol N/A 12/03/2012    Dr. Gala Romney: gastric diverticulum, gastric erosions and scar. Previously noted gastric ulcer completed healed. Biopsy without H.pylori.   . Esophageal biopsy N/A 12/03/2012    Procedure: BIOPSY;  Surgeon: Daneil Dolin, MD;  Location: [facility];  Service: Endoscopy;  Laterality: N/A;  . Maximum access (mas)posterior lumbar interbody fusion (plif) 1 level N/A 11/25/2013    Procedure: FOR MAXIMUM ACCESS (MAS) POSTERIOR LUMBAR INTERBODY FUSION (PLIF) 1 LEVEL;  Surgeon: Eustace Moore, MD;  Location: [facility];  Service: Neurosurgery;  Laterality: N/A;  FOR MAXIMUM ACCESS (MAS) POSTERIOR LUMBAR INTERBODY FUSION (PLIF) 1 LEVEL LUMBAR 3-4    Current Outpatient Prescriptions  Medication Sig Dispense Refill  . aspirin EC 81 MG tablet Take 81 mg by mouth daily.    Marland Kitchen esomeprazole (NEXIUM) 40 MG capsule TAKE ONE CAPSULE BY MOUTH DAILY BEFORE BREAKFAST. (Patient taking differently: Take 40 mg by mouth daily. TAKE ONE CAPSULE BY MOUTH DAILY BEFORE BREAKFAST.) 30 capsule 11  . ferrous sulfate 325 (65 FE) MG tablet Take 325 mg by mouth 2 (two) times daily.    Marland Kitchen gabapentin (NEURONTIN) 300 MG capsule Take 1 capsule (300 mg total) by mouth 2 (two) times daily. 60 capsule 1  . HYDROcodone-acetaminophen (NORCO/VICODIN) 5-325 MG per tablet Take 2 tablets  by mouth every 4 (four) hours as needed for moderate pain. (Patient not taking: Reported on 04/15/2014) 20 tablet 0  . Linaclotide (LINZESS) 290 MCG CAPS capsule Take 1 capsule (290 mcg total) by mouth daily. 30 minutes before breakfast. 30 capsule 3  . lisinopril-hydrochlorothiazide (PRINZIDE,ZESTORETIC) 20-12.5 MG per tablet Take 1 tablet by mouth 2 (two) times daily. 30 tablet 1  . metFORMIN (GLUCOPHAGE) 1000 MG tablet Take 1  tablet (1,000 mg total) by mouth 2 (two) times daily with a meal. 60 tablet 1  . methocarbamol (ROBAXIN) 500 MG tablet Take 1 tablet (500 mg total) by mouth every 6 (six) hours as needed for muscle spasms. (Patient not taking: Reported on 04/15/2014) 90 tablet 0  . simvastatin (ZOCOR) 20 MG tablet Take 1 tablet (20 mg total) by mouth daily at 6 PM. 30 tablet 1  . sucralfate (CARAFATE) 1 GM/10ML suspension Take 10 mLs (1 g total) by mouth 4 (four) times daily -  with meals and at bedtime. (Patient not taking: Reported on 04/15/2014) 420 mL 0  . amLODipine (NORVASC) 5 MG tablet Take 5 mg by mouth daily.    . sucralfate (CARAFATE) 1 GM/10ML suspension Take 10 mLs (1 g total) by mouth 4 (four) times daily. 420 mL 1  . [DISCONTINUED] ferrous fumarate (HEMOCYTE - 106 MG FE) 325 (106 FE) MG TABS tablet Take 1 tablet (106 mg of iron total) by mouth 2 (two) times daily. (Patient not taking: Reported on 04/01/2014) 60 each 0   No current facility-administered medications for this visit.    Allergies as of 04/13/2014  . (No Known Allergies)    Family History  Problem Relation Age of Onset  . Breast cancer Mother     deceased  . Cancer Mother   . Diabetes Father   . Hypertension Father   . Heart disease Father     deceased  . Hyperlipidemia Father   . Diabetes Sister   . Hypertension Sister   . Breast cancer Sister   . Colon cancer Neg Hx   . Liver disease Neg Hx     History   Social History  . Marital Status: Single    Spouse Name: N/A  . Number of Children: 1  . Years of Education: N/A   Occupational History  . trying to get disability    Social History Main Topics  . Smoking status: Current Some Day Smoker -- 0.50 packs/day for 20 years    Types: Cigarettes  . Smokeless tobacco: Never Used     Comment: given 1-800-Quit Now  . Alcohol Use: No  . Drug Use: Yes    Special: Cocaine     Comment: snorts  . Sexual Activity: Not on file   Other Topics Concern  . None   Social  History Narrative    Review of Systems: As mentioned in HPI.   Physical Exam: BP 126/81 mmHg  Pulse 99  Temp(Src) 97.3 F (36.3 C)  Ht 6\' 1"  (1.854 m)  Wt 201 lb 12.8 oz (91.536 kg)  BMI 26.63 kg/m2 General:   Alert and oriented. No distress noted. Pleasant and cooperative.  Head:  Normocephalic and atraumatic. Eyes:  Conjuctiva clear without scleral icterus. Mouth:  Oral mucosa pink and moist.  Heart:  S1, S2 present without murmurs Abdomen:  +BS, soft, TTP LUQ, epigastric and non-distended. No rebound or guarding. Smooth liver margin palpable with inspiration Msk:  Symmetrical without gross deformities. Normal posture. Extremities:  Without edema. Neurologic:  Alert and  oriented x4;  grossly normal neurologically. Psych:  Alert and cooperative. Normal mood and affect.  Lab Results  Component Value Date   WBC 8.9 04/01/2014   HGB 12.2* 04/01/2014   HCT 36.7* 04/01/2014   MCV 83.6 04/01/2014   PLT 354 04/01/2014   Lab Results  Component Value Date   ALT 21 04/01/2014   AST 19 04/01/2014   ALKPHOS 70 04/01/2014   BILITOT 0.5 04/01/2014   Last CT Feb 2016:  IMPRESSION: 1. There is no acute inflammatory process within abdomen. 2. There is a small diverticulum in proximal stomach without evidence of acute complication measures 2.5 cm. 3. No hydronephrosis or hydroureter. 4. Normal appendix. No pericecal inflammation. 5. Nonspecific mild thickening of urinary bladder wall. Clinical correlation is necessary to exclude mild cystitis. 6. Postsurgical changes with posterior metallic fusion lumbar spine at L3-L4 level.

## 2014-04-15 ENCOUNTER — Encounter: Payer: Self-pay | Admitting: Gastroenterology

## 2014-04-15 ENCOUNTER — Inpatient Hospital Stay (HOSPITAL_COMMUNITY)
Admission: RE | Admit: 2014-04-15 | Discharge: 2014-04-15 | Disposition: A | Discharge status: Home / Self Care | Payer: Medicaid Other | Source: Ambulatory Visit | Attending: Internal Medicine | Admitting: Internal Medicine

## 2014-04-15 ENCOUNTER — Encounter (HOSPITAL_COMMUNITY)
Admission: RE | Admit: 2014-04-15 | Discharge: 2014-04-15 | Disposition: A | Discharge status: Home / Self Care | Payer: Medicaid Other | Source: Ambulatory Visit | Attending: Internal Medicine | Admitting: Internal Medicine

## 2014-04-15 NOTE — Assessment & Plan Note (Signed)
65 year old male with history of cocaine use, chronic Hep C genotype 1a,  recurrent epigastric pain in the setting of historical PUD secondary to H.pylori, with last EGD in 2014 noting healed gastric ulcer. Gallbladder remains in situ, with last US abdomen in June 2015 normal. No LFT abnormalities on recent labs. Will proceed with EGD first, then likely update US abdomen +/- HIDA as clinically indicated if EGD is negative. PATIENT WILL NEED TO HAVE A DRUG SCREEN PRIOR TO EGD DUE TO COCAINE USE. This was discussed in detail explicitly at time of appointment.   Proceed with upper endoscopy in the near future with Dr. Gala Romney. The risks, benefits, and alternatives have been discussed in detail with patient. They have stated understanding and desire to proceed.  URINE DRUG SCREEN PRIOR Propofol due to drug abuse Increase Nexium to BID Carafate course sent to pharmacy for short-term  As of note: discussed chronic Hep C and possible treatment options ONLY if he were to abstain from drug use for at least 6 months. Compliance a concern at this point. Will retrieve notes from [facility] clinic.

## 2014-04-19 NOTE — Progress Notes (Signed)
cc'ed to pcp °

## 2014-04-20 ENCOUNTER — Ambulatory Visit (HOSPITAL_COMMUNITY): Admission: RE | Admit: 2014-04-20 | Payer: Medicaid Other | Source: Ambulatory Visit | Admitting: Internal Medicine

## 2014-04-20 ENCOUNTER — Encounter (HOSPITAL_COMMUNITY): Admission: RE | Payer: Self-pay | Source: Ambulatory Visit

## 2014-04-20 SURGERY — ESOPHAGOGASTRODUODENOSCOPY (EGD) WITH PROPOFOL
Anesthesia: Monitor Anesthesia Care

## 2014-04-20 NOTE — Progress Notes (Signed)
Received notes from Roosevelt Locks, NP with [facility] in [place]. Patient seen March 2015 for consideration of treatment for Hep C. UDS positive for cocaine at that time.   According to notes, patient will need to attend a program for drug and alcohol addiction, with documentation to support this. [place] Medicaid also prefers [place] with ribavirin for treatment of genotype 1 Hep C; however, ribavirin would likely not be tolerated with his history of chronic anemia. Harvoni would need to be attempted, which would need prior authorization.   I am highly concerned regarding his compliance. He has no-showed in the past and continues to use cocaine despite our recommendations. Treatment with Harvoni would need to be undertaken only when patient has shown compliance with drug cessation, demonstrates compliance with outpatient drug treatment, and follows up with recommended appointments. Thus far, it does not appear that treatment will be undertaken anywhere in the near future if he continues with current behaviors. This will be discussed with him at his upcoming appointment.  Orvil Feil, ANP-BC [facility]

## 2014-04-27 ENCOUNTER — Other Ambulatory Visit: Payer: Self-pay | Admitting: Cardiovascular Disease

## 2014-05-09 ENCOUNTER — Other Ambulatory Visit: Payer: Self-pay | Admitting: Physical Medicine & Rehabilitation

## 2014-05-17 ENCOUNTER — Ambulatory Visit: Payer: Medicaid Other | Admitting: Nurse Practitioner

## 2014-05-17 ENCOUNTER — Other Ambulatory Visit: Payer: Self-pay | Admitting: Physical Medicine & Rehabilitation

## 2014-06-02 ENCOUNTER — Emergency Department (HOSPITAL_COMMUNITY)
Admission: EM | Admit: 2014-06-02 | Discharge: 2014-06-02 | Disposition: A | Discharge status: Home / Self Care | Payer: Medicaid Other | Attending: Emergency Medicine | Admitting: Emergency Medicine

## 2014-06-02 ENCOUNTER — Encounter (HOSPITAL_COMMUNITY): Payer: Self-pay | Admitting: Cardiology

## 2014-06-02 ENCOUNTER — Emergency Department (HOSPITAL_COMMUNITY): Payer: Medicaid Other

## 2014-06-02 DIAGNOSIS — Z8619 Personal history of other infectious and parasitic diseases: Secondary | ICD-10-CM | POA: Insufficient documentation

## 2014-06-02 DIAGNOSIS — Y9301 Activity, walking, marching and hiking: Secondary | ICD-10-CM | POA: Insufficient documentation

## 2014-06-02 DIAGNOSIS — J45909 Unspecified asthma, uncomplicated: Secondary | ICD-10-CM | POA: Diagnosis not present

## 2014-06-02 DIAGNOSIS — Z87891 Personal history of nicotine dependence: Secondary | ICD-10-CM | POA: Diagnosis not present

## 2014-06-02 DIAGNOSIS — Z79899 Other long term (current) drug therapy: Secondary | ICD-10-CM | POA: Insufficient documentation

## 2014-06-02 DIAGNOSIS — Z8673 Personal history of transient ischemic attack (TIA), and cerebral infarction without residual deficits: Secondary | ICD-10-CM | POA: Diagnosis not present

## 2014-06-02 DIAGNOSIS — Y92009 Unspecified place in unspecified non-institutional (private) residence as the place of occurrence of the external cause: Secondary | ICD-10-CM | POA: Insufficient documentation

## 2014-06-02 DIAGNOSIS — D649 Anemia, unspecified: Secondary | ICD-10-CM | POA: Insufficient documentation

## 2014-06-02 DIAGNOSIS — E785 Hyperlipidemia, unspecified: Secondary | ICD-10-CM | POA: Diagnosis not present

## 2014-06-02 DIAGNOSIS — I10 Essential (primary) hypertension: Secondary | ICD-10-CM | POA: Diagnosis not present

## 2014-06-02 DIAGNOSIS — E119 Type 2 diabetes mellitus without complications: Secondary | ICD-10-CM | POA: Diagnosis not present

## 2014-06-02 DIAGNOSIS — Y998 Other external cause status: Secondary | ICD-10-CM | POA: Diagnosis not present

## 2014-06-02 DIAGNOSIS — Z7982 Long term (current) use of aspirin: Secondary | ICD-10-CM | POA: Diagnosis not present

## 2014-06-02 DIAGNOSIS — S4992XA Unspecified injury of left shoulder and upper arm, initial encounter: Secondary | ICD-10-CM | POA: Insufficient documentation

## 2014-06-02 DIAGNOSIS — W1839XA Other fall on same level, initial encounter: Secondary | ICD-10-CM | POA: Diagnosis not present

## 2014-06-02 DIAGNOSIS — M25512 Pain in left shoulder: Secondary | ICD-10-CM

## 2014-06-02 MED ORDER — HYDROCODONE-ACETAMINOPHEN 5-325 MG PO TABS: 1.0000 | ORAL_TABLET | ORAL | Status: DC | PRN

## 2014-06-02 NOTE — ED Notes (Signed)
Fell yesterday,  States his knee went out on him.  C/o pain to left elbow and left shoulder.

## 2014-06-02 NOTE — Discharge Instructions (Signed)
Shoulder Pain  The shoulder is the joint that connects your arm to your body. Muscles and band-like tissues that connect bones to muscles (tendons) hold the joint together. Shoulder pain is felt if an injury or medical problem affects one or more parts of the shoulder.  HOME CARE   · Put ice on the sore area.  ¨ Put ice in a plastic bag.  ¨ Place a towel between your skin and the bag.  ¨ Leave the ice on for 15-20 minutes, 03-04 times a day for the first 2 days.  · Stop using cold packs if they do not help with the pain.  · If you were given something to keep your shoulder from moving (sling; shoulder immobilizer), wear it as told. Only take it off to shower or bathe.  · Move your arm as little as possible, but keep your hand moving to prevent puffiness (swelling).  · Squeeze a soft ball or foam pad as much as possible to help prevent swelling.  · Take medicine as told by your doctor.  GET HELP IF:  · You have progressing new pain in your arm, hand, or fingers.  · Your hand or fingers get cold.  · Your medicine does not help lessen your pain.  GET HELP RIGHT AWAY IF:   · Your arm, hand, or fingers are numb or tingling.  · Your arm, hand, or fingers are puffy (swollen), painful, or turn white or blue.  MAKE SURE YOU:   · Understand these instructions.  · Will watch your condition.  · Will get help right away if you are not doing well or get worse.  Document Released: 07/17/2007 Document Revised: 06/14/2013 Document Reviewed: 08/12/2011  ExitCare® Patient Information ©2015 [employer]. This information is not intended to replace advice given to you by your health care provider. Make sure you discuss any questions you have with your health care provider.

## 2014-06-03 NOTE — ED Provider Notes (Signed)
CSN: [id number]     Arrival date & time 06/02/14  1116 History   First MD Initiated Contact with Patient 06/02/14 1128     Chief Complaint  Patient presents with  . Fall     (Consider location/radiation/quality/duration/timing/severity/associated sxs/prior Treatment) The history is provided by the patient.   Cory Weber is a 64 y.o. male with a history lumbar fusion October 2015 which he endorses slow recovery from, has recently graduated from using a walker to a cane.  He was walking a short distance at his home without his cane this am when his knee "went out from under him" causing him to fall, landing on his left elbow and shoulder yesterday.  He denies pain in the elbow, all pain is located in the shoulder which is worsened with movement, palpation and attempts to raise the arm over shoulder height.  He denies weakness or numbness in the distal arm or hand.  He did not hit his head, denies loc.  He has taken no medications for pain prior to arrival.     Past Medical History  Diagnosis Date  . Diabetes mellitus   . Hypertension   . Hepatitis C     states he was diagnosed in 2007 or 2007 while living in [place], [place]  . CVA (cerebral infarction) 01/2011  . Hyperlipidemia   . Asthma   . PVD (peripheral vascular disease)   . Anemia   . Stroke   . [id number]    Past Surgical History  Procedure Laterality Date  . Liver biopsy  2005    Done in [place], [place]. Chronic hepatitis with mild periportal inflammation, lobular unicellular necrosis and portal fibrosis. Grade 2, stage 1-2.  Marland Kitchen Colonoscopy with propofol N/A 09/03/2012    [id number] polyp-removed as outlined above. Prominent internal hemorrhoids. Tubular adenoma  . Esophagogastroduodenoscopy (egd) with propofol N/A 09/03/2012    [id number] hernia. Gastric diverticulum. Gastric ulcers with associated erosions. Duodenal erosions. Status post gastric biopsy. H.PYLORI gastritis   . Esophageal biopsy N/A 09/03/2012     Procedure: BIOPSY;  Surgeon: Daneil Dolin, MD;  Location: [facility];  Service: Endoscopy;  Laterality: N/A;  gastric and gastric mucosa  . Polypectomy N/A 09/03/2012    Procedure: POLYPECTOMY;  Surgeon: Daneil Dolin, MD;  Location: [facility];  Service: Endoscopy;  Laterality: N/A;  cecal polyp  . Esophagogastroduodenoscopy (egd) with propofol N/A 12/03/2012    Dr. Gala Romney: gastric diverticulum, gastric erosions and scar. Previously noted gastric ulcer completed healed. Biopsy without H.pylori.   . Esophageal biopsy N/A 12/03/2012    Procedure: BIOPSY;  Surgeon: Daneil Dolin, MD;  Location: [facility];  Service: Endoscopy;  Laterality: N/A;  . Maximum access (mas)posterior lumbar interbody fusion (plif) 1 level N/A 11/25/2013    Procedure: FOR MAXIMUM ACCESS (MAS) POSTERIOR LUMBAR INTERBODY FUSION (PLIF) 1 LEVEL;  Surgeon: Eustace Moore, MD;  Location: [facility];  Service: Neurosurgery;  Laterality: N/A;  FOR MAXIMUM ACCESS (MAS) POSTERIOR LUMBAR INTERBODY FUSION (PLIF) 1 LEVEL LUMBAR 3-4   Family History  Problem Relation Age of Onset  . Breast cancer Mother     deceased  . Cancer Mother   . Diabetes Father   . Hypertension Father   . Heart disease Father     deceased  . Hyperlipidemia Father   . Diabetes Sister   . Hypertension Sister   . Breast cancer Sister   . Colon cancer Neg Hx   . Liver disease Neg Hx  History  Substance Use Topics  . Smoking status: Former Smoker -- 0.50 packs/day for 20 years    Types: Cigarettes  . Smokeless tobacco: Never Used     Comment: given 1-800-Quit Now  . Alcohol Use: No    Review of Systems  Constitutional: Negative for fever.  Gastrointestinal: Negative for nausea.  Musculoskeletal: Positive for joint swelling and arthralgias. Negative for myalgias.  Neurological: Negative for weakness and numbness.      Allergies  Review of patient's allergies indicates no known allergies.  Home Medications   Prior to Admission medications    Medication Sig Start Date End Date Taking? Authorizing Provider  amLODipine (NORVASC) 5 MG tablet Take 5 mg by mouth daily.   Yes Historical Provider, MD  aspirin EC 81 MG tablet Take 81 mg by mouth daily.   Yes Historical Provider, MD  esomeprazole (NEXIUM) 40 MG capsule TAKE ONE CAPSULE BY MOUTH DAILY BEFORE BREAKFAST. Patient taking differently: Take 40 mg by mouth daily. TAKE ONE CAPSULE BY MOUTH DAILY BEFORE BREAKFAST. 12/03/13  Yes Daniel J Angiulli, PA-C  ferrous sulfate 325 (65 FE) MG tablet Take 325 mg by mouth 2 (two) times daily.   Yes Historical Provider, MD  gabapentin (NEURONTIN) 300 MG capsule Take 1 capsule (300 mg total) by mouth 2 (two) times daily. 12/03/13  Yes Daniel J Angiulli, PA-C  Linaclotide (LINZESS) 290 MCG CAPS capsule Take 1 capsule (290 mcg total) by mouth daily. 30 minutes before breakfast. 12/03/13  Yes Daniel J Angiulli, PA-C  lisinopril-hydrochlorothiazide (PRINZIDE,ZESTORETIC) 20-12.5 MG per tablet Take 1 tablet by mouth 2 (two) times daily. 12/03/13  Yes Daniel J Angiulli, PA-C  metFORMIN (GLUCOPHAGE) 1000 MG tablet Take 1 tablet (1,000 mg total) by mouth 2 (two) times daily with a meal. 12/03/13  Yes Daniel J Angiulli, PA-C  simvastatin (ZOCOR) 20 MG tablet TAKE ONE TABLET BY MOUTH AT 6PM. 04/27/14  Yes Herminio Commons, MD  HYDROcodone-acetaminophen (NORCO/VICODIN) 5-325 MG per tablet Take 1 tablet by mouth every 4 (four) hours as needed. 06/02/14   Evalee Jefferson, PA-C  methocarbamol (ROBAXIN) 500 MG tablet Take 1 tablet (500 mg total) by mouth every 6 (six) hours as needed for muscle spasms. Patient not taking: Reported on 04/15/2014 01/31/14   Meredith Staggers, MD  sucralfate (CARAFATE) 1 GM/10ML suspension Take 10 mLs (1 g total) by mouth 4 (four) times daily -  with meals and at bedtime. Patient not taking: Reported on 04/15/2014 04/01/14   Orpah Greek, MD  sucralfate (CARAFATE) 1 GM/10ML suspension Take 10 mLs (1 g total) by mouth 4 (four) times  daily. Patient not taking: Reported on 06/02/2014 04/13/14   Orvil Feil, NP   BP 140/88 mmHg  Pulse 104  Temp(Src) 97.7 F (36.5 C) (Oral)  Resp 18  Ht 6\' 1"  (1.854 m)  Wt 205 lb (92.987 kg)  BMI 27.05 kg/m2  SpO2 100% Physical Exam  Constitutional: He appears well-developed and well-nourished.  HENT:  Head: Atraumatic.  Neck: Normal range of motion.  Cardiovascular:  Pulses:      Radial pulses are 2+ on the right side, and 2+ on the left side.  Pulses equal bilaterally  Musculoskeletal: He exhibits tenderness.       Left shoulder: He exhibits decreased range of motion and bony tenderness. He exhibits no swelling, no effusion, no crepitus, no deformity and normal pulse.  ttp anterior and superior left shoulder.  No specific AC joint pain or deformity, clavicle and scapula nontender.  Neurological: He is alert. He has normal strength. He displays normal reflexes. No sensory deficit.  5/5 strength flex/ext of forearm.  Equal grip strength.  Skin: Skin is warm and dry.  Psychiatric: He has a normal mood and affect.    ED Course  Procedures (including critical care time) Labs Review Labs Reviewed - No data to display  Imaging Review Dg Shoulder Left  06/02/2014   CLINICAL DATA:  Left shoulder pain with decreased range of motion  EXAM: LEFT SHOULDER - 2+ VIEW  COMPARISON:  None.  FINDINGS: Mild degenerative changes of the acromioclavicular joint are seen. No acute fracture or dislocation is seen. No soft tissue changes are noted.  IMPRESSION: No acute abnormality seen.   Electronically Signed   By: Inez Catalina M.D.   On: 06/02/2014 12:28     EKG Interpretation None      MDM   Final diagnoses:  Shoulder pain, acute, left    Patients labs and/or radiological studies were reviewed and considered during the medical decision making and disposition process.  Results were also discussed with patient. Pt advised rest, ice,  Hydrocodone prescribed.  Plan f/u with pcp for  recheck appt if not improved over the next 10 days.    No fracture, no dislocation.  Discussed sprain as probable injury, cannot r/o rotator cuff injury at this time.  Pt aware he may need further imaging if sx do not resolve with time.    Evalee Jefferson, PA-C 06/03/14 0845  Fredia Sorrow, MD 06/06/14 [id number]

## 2014-06-09 ENCOUNTER — Other Ambulatory Visit: Payer: Self-pay | Admitting: Physical Medicine & Rehabilitation

## 2014-06-24 ENCOUNTER — Telehealth: Payer: Self-pay | Admitting: Internal Medicine

## 2014-06-24 NOTE — Telephone Encounter (Signed)
Patient called stating that when he goes to the bathroom his poop is green.  Please call him at [phone]

## 2014-06-24 NOTE — Telephone Encounter (Signed)
Spoke with the pt. He said it started a couple of days ago. The stool is a normal consistency, just green in color. No diarrhea. No lower abd pain, no blood in his stool. He cancelled his procedure last month because he got scared to have it done. Advised him that we needed to bring him back in for an ov.  Please schedule.

## 2014-06-27 ENCOUNTER — Encounter: Payer: Self-pay | Admitting: Internal Medicine

## 2014-06-27 NOTE — Telephone Encounter (Signed)
APPOINTMENT MADE AND LETTER SENT °

## 2014-07-12 ENCOUNTER — Encounter: Payer: Self-pay | Admitting: Nurse Practitioner

## 2014-07-12 ENCOUNTER — Ambulatory Visit (INDEPENDENT_AMBULATORY_CARE_PROVIDER_SITE_OTHER): Payer: Medicaid Other | Admitting: Nurse Practitioner

## 2014-07-12 VITALS — BP 119/80 | HR 101 | Temp 97.2°F | Ht 73.0 in | Wt 199.0 lb

## 2014-07-12 DIAGNOSIS — B182 Chronic viral hepatitis C: Secondary | ICD-10-CM | POA: Diagnosis not present

## 2014-07-12 NOTE — Progress Notes (Signed)
Referring Provider: Iona Beard, MD Primary Care Physician:  Maggie Font, MD Primary GI:  Dr. Gala Romney  Chief Complaint  Patient presents with  . Colposcopy    HPI:   64 year old male presents to set up procedure. Last EGD 12/03/2012 for failed treatment of H. Pylori found gastric diverticulum, gastric erosions and scar, previous gastric ulcer completely healed with biopsies taken. Pathology showed stomach biopsy with ulcerated gastric antral type mucosa with no evidence of H. Pylori. Colonoscopy 09/03/2012 showed adequate preparation, prominent internal hemorrhoids, 4 mm polyp in the base of the cecum. Pathology showed colon polyp was tubular adenoma. Patient was continued on his Nexium and provided a Prevpac prescription for H. Pylori and recommended repeat office visit in 3 months. She was seen 05/01/2014 with worsening symptoms of dyspepsia and scheduled for an endoscopy. Per the patient he canceled the procedure because "he was scared to have it done."  Today he states he doesn't want to schedule an endoscopy, he's doing ok. He wants to talk about treatment for his Hepatitis. He was previously referred to [facility] for treatment but he never went there cause he was told we have it here now. Previous notes indicate inconsistency with drug use. Patient was tested immune for Hep A and B 05/20/2012. Hep C positive 05/20/2012, genotype 1a, RNA quant [id number], quant log 7.06. Has not had fibroscan US done yet.   His symptoms today include decreased energy. Denies abdominal pain, N/V, hematochezia, melena. Denies jaundice, scleral icterus. Denies fever, chills, unintentional weight loss. Objectively his weight is stable compared to his visit almost 3 months ago. Last cocaine use was April of this year (about 2 months ago.)  Past Medical History  Diagnosis Date  . Diabetes mellitus   . Hypertension   . Hepatitis C     states he was diagnosed in 2007 or 2007 while living in [place], [place]  .  CVA (cerebral infarction) 01/2011  . Hyperlipidemia   . Asthma   . PVD (peripheral vascular disease)   . Anemia   . Stroke   . [id number])     Past Surgical History  Procedure Laterality Date  . Liver biopsy  2005    Done in [place], [place]. Chronic hepatitis with mild periportal inflammation, lobular unicellular necrosis and portal fibrosis. Grade 2, stage 1-2.  Marland Kitchen Colonoscopy with propofol N/A 09/03/2012    [id number] polyp-removed as outlined above. Prominent internal hemorrhoids. Tubular adenoma  . Esophagogastroduodenoscopy (egd) with propofol N/A 09/03/2012    [id number] hernia. Gastric diverticulum. Gastric ulcers with associated erosions. Duodenal erosions. Status post gastric biopsy. H.PYLORI gastritis   . Esophageal biopsy N/A 09/03/2012    Procedure: BIOPSY;  Surgeon: Daneil Dolin, MD;  Location: [facility];  Service: Endoscopy;  Laterality: N/A;  gastric and gastric mucosa  . Polypectomy N/A 09/03/2012    Procedure: POLYPECTOMY;  Surgeon: Daneil Dolin, MD;  Location: [facility];  Service: Endoscopy;  Laterality: N/A;  cecal polyp  . Esophagogastroduodenoscopy (egd) with propofol N/A 12/03/2012    Dr. Gala Romney: gastric diverticulum, gastric erosions and scar. Previously noted gastric ulcer completed healed. Biopsy without H.pylori.   . Esophageal biopsy N/A 12/03/2012    Procedure: BIOPSY;  Surgeon: Daneil Dolin, MD;  Location: [facility];  Service: Endoscopy;  Laterality: N/A;  . Maximum access (mas)posterior lumbar interbody fusion (plif) 1 level N/A 11/25/2013    Procedure: FOR MAXIMUM ACCESS (MAS) POSTERIOR LUMBAR INTERBODY FUSION (PLIF) 1 LEVEL;  Surgeon: Eustace Moore, MD;  [facility];  Service: Neurosurgery;  Laterality: N/A;  FOR MAXIMUM ACCESS (MAS) POSTERIOR LUMBAR INTERBODY FUSION (PLIF) 1 LEVEL LUMBAR 3-4    Current Outpatient Prescriptions  Medication Sig Dispense Refill  . amLODipine (NORVASC) 5 MG tablet Take 5 mg by mouth daily.    Marland Kitchen aspirin EC 81 MG  tablet Take 81 mg by mouth daily.    Marland Kitchen esomeprazole (NEXIUM) 40 MG capsule TAKE ONE CAPSULE BY MOUTH DAILY BEFORE BREAKFAST. (Patient taking differently: Take 40 mg by mouth daily. TAKE ONE CAPSULE BY MOUTH DAILY BEFORE BREAKFAST.) 30 capsule 11  . ferrous sulfate 325 (65 FE) MG tablet Take 325 mg by mouth 2 (two) times daily.    Marland Kitchen gabapentin (NEURONTIN) 300 MG capsule Take 1 capsule (300 mg total) by mouth 2 (two) times daily. 60 capsule 1  . lisinopril-hydrochlorothiazide (PRINZIDE,ZESTORETIC) 20-12.5 MG per tablet Take 1 tablet by mouth 2 (two) times daily. 30 tablet 1  . metFORMIN (GLUCOPHAGE) 1000 MG tablet Take 1 tablet (1,000 mg total) by mouth 2 (two) times daily with a meal. 60 tablet 1  . methocarbamol (ROBAXIN) 500 MG tablet TAKE ONE TABLET BY MOUTH EVERY 6 HOURS AS NEEDED FOR MUSCLE SPASMS. 90 tablet 0  . simvastatin (ZOCOR) 20 MG tablet TAKE ONE TABLET BY MOUTH AT 6PM. 30 tablet 3  . sucralfate (CARAFATE) 1 GM/10ML suspension Take 10 mLs (1 g total) by mouth 4 (four) times daily. 420 mL 1  . HYDROcodone-acetaminophen (NORCO/VICODIN) 5-325 MG per tablet Take 1 tablet by mouth every 4 (four) hours as needed. (Patient not taking: Reported on 07/12/2014) 20 tablet 0  . LINZESS 290 MCG CAPS capsule TAKE ONE CAPSULE BY MOUTH DAILY 30 MINUTES BEFORE BREAKFAST. (Patient not taking: Reported on 07/12/2014) 30 capsule 0  . sucralfate (CARAFATE) 1 GM/10ML suspension Take 10 mLs (1 g total) by mouth 4 (four) times daily -  with meals and at bedtime. (Patient not taking: Reported on 07/12/2014) 420 mL 0  . [DISCONTINUED] ferrous fumarate (HEMOCYTE - 106 MG FE) 325 (106 FE) MG TABS tablet Take 1 tablet (106 mg of iron total) by mouth 2 (two) times daily. (Patient not taking: Reported on 04/01/2014) 60 each 0   No current facility-administered medications for this visit.    Allergies as of 07/12/2014  . (No Known Allergies)    Family History  Problem Relation Age of Onset  . Breast cancer Mother      deceased  . Cancer Mother   . Diabetes Father   . Hypertension Father   . Heart disease Father     deceased  . Hyperlipidemia Father   . Diabetes Sister   . Hypertension Sister   . Breast cancer Sister   . Colon cancer Neg Hx   . Liver disease Neg Hx     History   Social History  . Marital Status: Single    Spouse Name: N/A  . Number of Children: 1  . Years of Education: N/A   Occupational History  . trying to get disability    Social History Main Topics  . Smoking status: Former Smoker -- 0.50 packs/day for 20 years    Types: Cigarettes  . Smokeless tobacco: Never Used     Comment: given 1-800-Quit Now  . Alcohol Use: No  . Drug Use: No     Comment: last used november.   . Sexual Activity: Not on file   Other Topics Concern  . None   Social History Narrative  Review of Systems: General: Negative for anorexia, weight loss, fever, chills. Admits increasing fatigue. ENT: Negative for hoarseness, difficulty swallowing, hematemesis. CV: Negative for chest pain, angina, palpitations, dyspnea on exertion.  Respiratory: Negative for dyspnea at rest, dyspnea on exertion, wheezing.  GI: See history of present illness. Derm: Negative for rash or itching.  Neuro: Negative for weakness, abnormal sensation, confusion. Psych: Negative for anxiety, depression.  Endo: Negative for unusual weight change.  Heme: Negative for bruising or bleeding.   Physical Exam: BP 119/80 mmHg  Pulse 101  Temp(Src) 97.2 F (36.2 C) (Oral)  Ht 6\' 1"  (1.854 m)  Wt 199 lb (90.266 kg)  BMI 26.26 kg/m2 General:   Alert and oriented. Pleasant and cooperative. Well-nourished and well-developed.  Head:  Normocephalic and atraumatic. Eyes:  Without icterus, sclera clear and conjunctiva pink.  Cardiovascular:  S1, S2 present without murmurs appreciated. Tachycardic rate. Extremities without clubbing or edema. Respiratory:  Clear to auscultation bilaterally. No wheezes, rales, or rhonchi. No  distress.  Gastrointestinal:  +BS, soft, non-tender and non-distended. No HSM noted. No guarding or rebound. No masses appreciated.  Rectal:  Deferred  Neurologic:  Alert and oriented x4;  grossly normal neurologically. Psych:  Alert and cooperative. Normal mood and affect.  **NOTE: visit time approximately 20 mins with more than half the time on care coordination, counseling, and education**   07/12/2014 9:29 AM

## 2014-07-12 NOTE — Assessment & Plan Note (Signed)
64 year old male with chronic hepatitis C who is again interested in treatment options. All of his labs appear to have been completed, has recently had an endoscopy, is only lacking fibroscan ultrasound. He admits last use of cocaine 2 months ago. Had a lengthy discussion with him on the need to be clean for 6 months at a minimum for his internist consider treatment of hepatitis C. He asked anyway to speed the process up and again informed him that he needs to be clean for 6 months and at this point the holdup is the recurrent drug use. He stated that he needs to get this treated and is interested and entering into a treatment program in order to get clean. He is seeing his primary care provider in 7 days. I recommended that he talk to his primary care provider about referral to a treatment program. We will also contact his primary care provider to notify them of his interest. We'll have him return for further evaluation in 6 months for consideration of HCV treatment dependent on no further drug use and with need for Fibroscan.

## 2014-07-12 NOTE — Patient Instructions (Signed)
1. Keep her appointment with her primary care provider on June 7 to discuss treatment options for drug use. 2. We will have you return to see [place] in 6 months to see were you are in your progress and possible consideration for hep C treatment dependent on no further drug use as well as insurance decision on coverage. 3. Continue your best efforts to remain drug free.

## 2014-07-27 NOTE — Progress Notes (Signed)
cc'd to pcp 

## 2014-09-01 ENCOUNTER — Encounter: Payer: Self-pay | Admitting: Orthopedic Surgery

## 2014-09-01 ENCOUNTER — Ambulatory Visit (INDEPENDENT_AMBULATORY_CARE_PROVIDER_SITE_OTHER): Payer: Medicaid Other | Admitting: Orthopedic Surgery

## 2014-09-01 VITALS — BP 112/79 | Ht 73.0 in | Wt 206.0 lb

## 2014-09-01 DIAGNOSIS — M75102 Unspecified rotator cuff tear or rupture of left shoulder, not specified as traumatic: Secondary | ICD-10-CM | POA: Diagnosis not present

## 2014-09-01 MED ORDER — HYDROCODONE-ACETAMINOPHEN 7.5-325 MG PO TABS: 1.0000 | ORAL_TABLET | Freq: Four times a day (QID) | ORAL | Status: DC | PRN

## 2014-09-01 NOTE — Addendum Note (Signed)
Addended by: Carole Civil on: 09/01/2014 02:50 PM   Modules accepted: Orders

## 2014-09-01 NOTE — Progress Notes (Signed)
Patient ID: Cory Weber, male   DOB: 1950-12-07, 64 y.o.   MRN: [id number] New   Chief Complaint  Patient presents with  . Shoulder Injury    left shoulder pain s/p fall, DOI 06/01/14     Cory Weber is a 64 y.o. male.   HPI 64 year old male presents with a 3 month history of severe sharp pain in his left shoulder. Pain scale 8 out of 10 patient fell in his yard and then the pain in the left shoulder got worse and has pain lifting his arm and pain when he tries to fall asleep at night  Review of systems excessive nighttime urination frequent urination all other systems reviewed were normal except for muscle weakness  He is a known diabetic and has hepatitis C. He has some hypertension some breathing issues   Review of Systems See hpi  Past Medical History  Diagnosis Date  . Diabetes mellitus   . Hypertension   . Hepatitis C     states he was diagnosed in 2007 or 2007 while living in [place], [place]  . CVA (cerebral infarction) 01/2011  . Hyperlipidemia   . Asthma   . PVD (peripheral vascular disease)   . Anemia   . Stroke   . [id number]     Past Surgical History  Procedure Laterality Date  . Liver biopsy  2005    Done in [place], [place]. Chronic hepatitis with mild periportal inflammation, lobular unicellular necrosis and portal fibrosis. Grade 2, stage 1-2.  Marland Kitchen Colonoscopy with propofol N/A 09/03/2012    [id number] polyp-removed as outlined above. Prominent internal hemorrhoids. Tubular adenoma  . Esophagogastroduodenoscopy (egd) with propofol N/A 09/03/2012    [id number] hernia. Gastric diverticulum. Gastric ulcers with associated erosions. Duodenal erosions. Status post gastric biopsy. H.PYLORI gastritis   . Esophageal biopsy N/A 09/03/2012    Procedure: BIOPSY;  Surgeon: Daneil Dolin, MD;  Location: [facility];  Service: Endoscopy;  Laterality: N/A;  gastric and gastric mucosa  . Polypectomy N/A 09/03/2012    Procedure: POLYPECTOMY;  Surgeon: Daneil Dolin,  MD;  Location: [facility];  Service: Endoscopy;  Laterality: N/A;  cecal polyp  . Esophagogastroduodenoscopy (egd) with propofol N/A 12/03/2012    Dr. Gala Romney: gastric diverticulum, gastric erosions and scar. Previously noted gastric ulcer completed healed. Biopsy without H.pylori.   . Esophageal biopsy N/A 12/03/2012    Procedure: BIOPSY;  Surgeon: Daneil Dolin, MD;  Location: [facility];  Service: Endoscopy;  Laterality: N/A;  . Maximum access (mas)posterior lumbar interbody fusion (plif) 1 level N/A 11/25/2013    Procedure: FOR MAXIMUM ACCESS (MAS) POSTERIOR LUMBAR INTERBODY FUSION (PLIF) 1 LEVEL;  Surgeon: Eustace Moore, MD;  Location: [facility];  Service: Neurosurgery;  Laterality: N/A;  FOR MAXIMUM ACCESS (MAS) POSTERIOR LUMBAR INTERBODY FUSION (PLIF) 1 LEVEL LUMBAR 3-4    Family History  Problem Relation Age of Onset  . Breast cancer Mother     deceased  . Cancer Mother   . Diabetes Father   . Hypertension Father   . Heart disease Father     deceased  . Hyperlipidemia Father   . Diabetes Sister   . Hypertension Sister   . Breast cancer Sister   . Colon cancer Neg Hx   . Liver disease Neg Hx     Social History History  Substance Use Topics  . Smoking status: Current Some Day Smoker -- 0.10 packs/day for 20 years    Types: Cigarettes  . Smokeless tobacco: Never  Used     Comment: given 1-800-Quit Now  . Alcohol Use: No    No Known Allergies  Current Outpatient Prescriptions  Medication Sig Dispense Refill  . amLODipine (NORVASC) 5 MG tablet Take 5 mg by mouth daily.    Marland Kitchen aspirin EC 81 MG tablet Take 81 mg by mouth daily.    Marland Kitchen esomeprazole (NEXIUM) 40 MG capsule TAKE ONE CAPSULE BY MOUTH DAILY BEFORE BREAKFAST. (Patient taking differently: Take 40 mg by mouth daily. TAKE ONE CAPSULE BY MOUTH DAILY BEFORE BREAKFAST.) 30 capsule 11  . ferrous sulfate 325 (65 FE) MG tablet Take 325 mg by mouth 2 (two) times daily.    Marland Kitchen gabapentin (NEURONTIN) 300 MG capsule Take 1 capsule (300  mg total) by mouth 2 (two) times daily. 60 capsule 1  . lisinopril-hydrochlorothiazide (PRINZIDE,ZESTORETIC) 20-12.5 MG per tablet Take 1 tablet by mouth 2 (two) times daily. 30 tablet 1  . metFORMIN (GLUCOPHAGE) 1000 MG tablet Take 1 tablet (1,000 mg total) by mouth 2 (two) times daily with a meal. 60 tablet 1  . methocarbamol (ROBAXIN) 500 MG tablet TAKE ONE TABLET BY MOUTH EVERY 6 HOURS AS NEEDED FOR MUSCLE SPASMS. 90 tablet 0  . simvastatin (ZOCOR) 20 MG tablet TAKE ONE TABLET BY MOUTH AT 6PM. 30 tablet 3  . sucralfate (CARAFATE) 1 GM/10ML suspension Take 10 mLs (1 g total) by mouth 4 (four) times daily. 420 mL 1  . [DISCONTINUED] ferrous fumarate (HEMOCYTE - 106 MG FE) 325 (106 FE) MG TABS tablet Take 1 tablet (106 mg of iron total) by mouth 2 (two) times daily. (Patient not taking: Reported on 04/01/2014) 60 each 0   No current facility-administered medications for this visit.       Physical Exam Blood pressure 112/79, height 6\' 1"  (1.854 m), weight 206 lb (93.441 kg). Physical Exam The patient is well developed well nourished and well groomed. Orientation to person place and time is normal  Mood is pleasant. Ambulatory status normal ambulatory status   He has no problems with for elevation of his right shoulder has good strength there and no instability  The left shoulder is tenderness he has crepitance on range of motion is otherwise stable the motor exam is normal for the rotator cuff he has a positive impingement sign scans intact has good distal pulse and normal sensation there is no lymphadenopathy  Data Reviewed Plane film shows no abnormality I read it as normal glenohumeral joint.  Assessment Encounter Diagnosis  Name Primary?  . Rotator cuff syndrome of left shoulder Yes    Plan Recommend injection and he can have 2 weeks worth Norco 7.5 mg  Procedure note the subacromial injection shoulder left   Verbal consent was obtained to inject the  Left    Shoulder  Timeout was completed to confirm the injection site is a subacromial space of the  left  shoulder  Medication used Depo-Medrol 40 mg and lidocaine 1% 3 cc  Anesthesia was provided by ethyl chloride  The injection was performed in the left  posterior subacromial space. After pinning the skin with alcohol and anesthetized the skin with ethyl chloride the subacromial space was injected using a 20-gauge needle. There were no complications  Sterile dressing was applied.

## 2014-09-14 ENCOUNTER — Emergency Department (HOSPITAL_COMMUNITY)
Admission: EM | Admit: 2014-09-14 | Discharge: 2014-09-14 | Disposition: A | Discharge status: Home / Self Care | Payer: Medicaid Other | Attending: Emergency Medicine | Admitting: Emergency Medicine

## 2014-09-14 ENCOUNTER — Encounter (HOSPITAL_COMMUNITY): Payer: Self-pay | Admitting: Emergency Medicine

## 2014-09-14 DIAGNOSIS — Z8673 Personal history of transient ischemic attack (TIA), and cerebral infarction without residual deficits: Secondary | ICD-10-CM | POA: Diagnosis not present

## 2014-09-14 DIAGNOSIS — E785 Hyperlipidemia, unspecified: Secondary | ICD-10-CM | POA: Diagnosis not present

## 2014-09-14 DIAGNOSIS — J45909 Unspecified asthma, uncomplicated: Secondary | ICD-10-CM | POA: Insufficient documentation

## 2014-09-14 DIAGNOSIS — E119 Type 2 diabetes mellitus without complications: Secondary | ICD-10-CM | POA: Diagnosis not present

## 2014-09-14 DIAGNOSIS — Z7982 Long term (current) use of aspirin: Secondary | ICD-10-CM | POA: Insufficient documentation

## 2014-09-14 DIAGNOSIS — Z8619 Personal history of other infectious and parasitic diseases: Secondary | ICD-10-CM | POA: Insufficient documentation

## 2014-09-14 DIAGNOSIS — R059 Cough, unspecified: Secondary | ICD-10-CM

## 2014-09-14 DIAGNOSIS — J029 Acute pharyngitis, unspecified: Secondary | ICD-10-CM | POA: Diagnosis present

## 2014-09-14 DIAGNOSIS — R05 Cough: Secondary | ICD-10-CM | POA: Diagnosis not present

## 2014-09-14 DIAGNOSIS — R0981 Nasal congestion: Secondary | ICD-10-CM | POA: Insufficient documentation

## 2014-09-14 DIAGNOSIS — I10 Essential (primary) hypertension: Secondary | ICD-10-CM | POA: Insufficient documentation

## 2014-09-14 DIAGNOSIS — Z720 Tobacco use: Secondary | ICD-10-CM | POA: Diagnosis not present

## 2014-09-14 DIAGNOSIS — D649 Anemia, unspecified: Secondary | ICD-10-CM | POA: Diagnosis not present

## 2014-09-14 DIAGNOSIS — Z79899 Other long term (current) drug therapy: Secondary | ICD-10-CM | POA: Diagnosis not present

## 2014-09-14 MED ORDER — MAGIC MOUTHWASH W/LIDOCAINE: 5.0000 mL | Freq: Three times a day (TID) | ORAL | Status: DC | PRN

## 2014-09-14 MED ORDER — AZITHROMYCIN 250 MG PO TABS: 250.0000 mg | ORAL_TABLET | Freq: Every day | ORAL | Status: DC

## 2014-09-14 MED ORDER — TRAMADOL HCL 50 MG PO TABS: 50.0000 mg | ORAL_TABLET | Freq: Four times a day (QID) | ORAL | Status: DC | PRN

## 2014-09-14 NOTE — ED Notes (Signed)
Patient with no complaints at this time. Respirations even and unlabored. Skin warm/dry. Discharge instructions reviewed with patient at this time. Patient given opportunity to voice concerns/ask questions. Patient discharged at this time and left Emergency Department with steady gait.   

## 2014-09-14 NOTE — Discharge Instructions (Signed)
Cough, Adult  A cough is a reflex. It helps you clear your throat and airways. A cough can help heal your body. A cough can last 2 or 3 weeks (acute) or may last more than 8 weeks (chronic). Some common causes of a cough can include an infection, allergy, or a cold. HOME CARE  Only take medicine as told by your doctor.  If given, take your medicines (antibiotics) as told. Finish them even if you start to feel better.  Use a cold steam vaporizer or humidifier in your home. This can help loosen thick spit (secretions).  Sleep so you are almost sitting up (semi-upright). Use pillows to do this. This helps reduce coughing.  Rest as needed.  Stop smoking if you smoke. GET HELP RIGHT AWAY IF:  You have yellowish-white fluid (pus) in your thick spit.  Your cough gets worse.  Your medicine does not reduce coughing, and you are losing sleep.  You cough up blood.  You have trouble breathing.  Your pain gets worse and medicine does not help.  You have a fever. MAKE SURE YOU:   Understand these instructions.  Will watch your condition.  Will get help right away if you are not doing well or get worse. Document Released: 10/11/2010 Document Revised: 06/14/2013 Document Reviewed: 10/11/2010 [facility] Patient Information 2015 [place], [place]. This information is not intended to replace advice given to you by your health care provider. Make sure you discuss any questions you have with your health care provider.  Pharyngitis Pharyngitis is a sore throat (pharynx). There is redness, pain, and swelling of your throat. HOME CARE   Drink enough fluids to keep your pee (urine) clear or pale yellow.  Only take medicine as told by your doctor.  You may get sick again if you do not take medicine as told. Finish your medicines, even if you start to feel better.  Do not take aspirin.  Rest.  Rinse your mouth (gargle) with salt water ( tsp of salt per 1 qt of water) every 1-2 hours. This  will help the pain.  If you are not at risk for choking, you can suck on hard candy or sore throat lozenges. GET HELP IF:  You have large, tender lumps on your neck.  You have a rash.  You cough up green, yellow-brown, or bloody spit. GET HELP RIGHT AWAY IF:   You have a stiff neck.  You drool or cannot swallow liquids.  You throw up (vomit) or are not able to keep medicine or liquids down.  You have very bad pain that does not go away with medicine.  You have problems breathing (not from a stuffy nose). MAKE SURE YOU:   Understand these instructions.  Will watch your condition.  Will get help right away if you are not doing well or get worse. Document Released: 07/17/2007 Document Revised: 11/18/2012 Document Reviewed: 10/05/2012 [facility] Patient Information 2015 [place], [place]. This information is not intended to replace advice given to you by your health care provider. Make sure you discuss any questions you have with your health care provider.

## 2014-09-14 NOTE — ED Notes (Signed)
Pt reports sore throat, cough, nasal drainage x1 week. Pt denies any known fevers.

## 2014-09-15 NOTE — ED Provider Notes (Signed)
CSN: [id number]     Arrival date & time 09/14/14  1118 History   First MD Initiated Contact with Patient 09/14/14 1222     Chief Complaint  Patient presents with  . Sore Throat     (Consider location/radiation/quality/duration/timing/severity/associated sxs/prior Treatment) HPI  Cory Weber is a 64 y.o. male who presents to the Emergency Department complaining of sore throat and cough for one week.  Cough has been occasionally productive.  He has tried OTC cold medications without relief.  He denies fever, chills, shortness of breath, chest pain, headache and rash.  Nothing makes his symptoms better or worse.   Past Medical History  Diagnosis Date  . Diabetes mellitus   . Hypertension   . Hepatitis C     states he was diagnosed in 2007 or 2007 while living in [place], [place]  . CVA (cerebral infarction) 01/2011  . Hyperlipidemia   . Asthma   . PVD (peripheral vascular disease)   . Anemia   . Stroke   . [id number])    Past Surgical History  Procedure Laterality Date  . Liver biopsy  2005    Done in [place], [place]. Chronic hepatitis with mild periportal inflammation, lobular unicellular necrosis and portal fibrosis. Grade 2, stage 1-2.  Marland Kitchen Colonoscopy with propofol N/A 09/03/2012    [id number] polyp-removed as outlined above. Prominent internal hemorrhoids. Tubular adenoma  . Esophagogastroduodenoscopy (egd) with propofol N/A 09/03/2012    [id number] hernia. Gastric diverticulum. Gastric ulcers with associated erosions. Duodenal erosions. Status post gastric biopsy. H.PYLORI gastritis   . Esophageal biopsy N/A 09/03/2012    Procedure: BIOPSY;  Surgeon: Daneil Dolin, MD;  Location: [facility];  Service: Endoscopy;  Laterality: N/A;  gastric and gastric mucosa  . Polypectomy N/A 09/03/2012    Procedure: POLYPECTOMY;  Surgeon: Daneil Dolin, MD;  Location: [facility];  Service: Endoscopy;  Laterality: N/A;  cecal polyp  . Esophagogastroduodenoscopy (egd) with propofol N/A 12/03/2012     Dr. Gala Romney: gastric diverticulum, gastric erosions and scar. Previously noted gastric ulcer completed healed. Biopsy without H.pylori.   . Esophageal biopsy N/A 12/03/2012    Procedure: BIOPSY;  Surgeon: Daneil Dolin, MD;  Location: [facility];  Service: Endoscopy;  Laterality: N/A;  . Maximum access (mas)posterior lumbar interbody fusion (plif) 1 level N/A 11/25/2013    Procedure: FOR MAXIMUM ACCESS (MAS) POSTERIOR LUMBAR INTERBODY FUSION (PLIF) 1 LEVEL;  Surgeon: Eustace Moore, MD;  Location: [place] [facility];  Service: Neurosurgery;  Laterality: N/A;  FOR MAXIMUM ACCESS (MAS) POSTERIOR LUMBAR INTERBODY FUSION (PLIF) 1 LEVEL LUMBAR 3-4   Family History  Problem Relation Age of Onset  . Breast cancer Mother     deceased  . Cancer Mother   . Diabetes Father   . Hypertension Father   . Heart disease Father     deceased  . Hyperlipidemia Father   . Diabetes Sister   . Hypertension Sister   . Breast cancer Sister   . Colon cancer Neg Hx   . Liver disease Neg Hx    History  Substance Use Topics  . Smoking status: Current Some Day Smoker -- 0.10 packs/day for 20 years    Types: Cigarettes  . Smokeless tobacco: Never Used     Comment: given 1-800-Quit Now  . Alcohol Use: No    Review of Systems  Constitutional: Negative for fever, chills, activity change and appetite change.  HENT: Positive for congestion and sore throat. Negative for ear pain, facial swelling, trouble swallowing  and voice change.   Eyes: Negative for pain and visual disturbance.  Respiratory: Positive for cough. Negative for chest tightness, shortness of breath and wheezing.   Cardiovascular: Negative for chest pain.  Gastrointestinal: Negative for nausea, vomiting and abdominal pain.  Musculoskeletal: Negative for arthralgias, neck pain and neck stiffness.  Skin: Negative for color change and rash.  Neurological: Negative for dizziness, facial asymmetry, speech difficulty, numbness and headaches.  Hematological:  Negative for adenopathy.  All other systems reviewed and are negative.     Allergies  Review of patient's allergies indicates no known allergies.  Home Medications   Prior to Admission medications   Medication Sig Start Date End Date Taking? Authorizing Provider  Alum & Mag Hydroxide-Simeth (MAGIC MOUTHWASH W/LIDOCAINE) SOLN Take 5 mLs by mouth 3 (three) times daily as needed for mouth pain. Swish and spit, do not swallow 09/14/14   Amalea Ottey, PA-C  amLODipine (NORVASC) 5 MG tablet Take 5 mg by mouth daily.    Historical Provider, MD  aspirin EC 81 MG tablet Take 81 mg by mouth daily.    Historical Provider, MD  azithromycin (ZITHROMAX) 250 MG tablet Take 1 tablet (250 mg total) by mouth daily. Take first 2 tablets together, then 1 every day until finished. 09/14/14   Jamar Casagrande, PA-C  esomeprazole (NEXIUM) 40 MG capsule TAKE ONE CAPSULE BY MOUTH DAILY BEFORE BREAKFAST. Patient taking differently: Take 40 mg by mouth daily. TAKE ONE CAPSULE BY MOUTH DAILY BEFORE BREAKFAST. 12/03/13   Lavon Paganini Angiulli, PA-C  ferrous sulfate 325 (65 FE) MG tablet Take 325 mg by mouth 2 (two) times daily.    Historical Provider, MD  gabapentin (NEURONTIN) 300 MG capsule Take 1 capsule (300 mg total) by mouth 2 (two) times daily. 12/03/13   Lavon Paganini Angiulli, PA-C  HYDROcodone-acetaminophen (NORCO) 7.5-325 MG per tablet Take 1 tablet by mouth every 6 (six) hours as needed for moderate pain. 09/01/14   Carole Civil, MD  lisinopril-hydrochlorothiazide (PRINZIDE,ZESTORETIC) 20-12.5 MG per tablet Take 1 tablet by mouth 2 (two) times daily. 12/03/13   Lavon Paganini Angiulli, PA-C  metFORMIN (GLUCOPHAGE) 1000 MG tablet Take 1 tablet (1,000 mg total) by mouth 2 (two) times daily with a meal. 12/03/13   Lavon Paganini Angiulli, PA-C  methocarbamol (ROBAXIN) 500 MG tablet TAKE ONE TABLET BY MOUTH EVERY 6 HOURS AS NEEDED FOR MUSCLE SPASMS. 06/10/14   Meredith Staggers, MD  simvastatin (ZOCOR) 20 MG tablet TAKE ONE TABLET BY  MOUTH AT 6PM. 04/27/14   Herminio Commons, MD  sucralfate (CARAFATE) 1 GM/10ML suspension Take 10 mLs (1 g total) by mouth 4 (four) times daily. 04/13/14   Orvil Feil, NP  traMADol (ULTRAM) 50 MG tablet Take 1 tablet (50 mg total) by mouth every 6 (six) hours as needed. 09/14/14   Lella Mullany, PA-C   BP 137/92 mmHg  Pulse 88  Temp(Src) 98.2 F (36.8 C) (Oral)  Resp 16  Ht 6\' 1"  (1.854 m)  Wt 212 lb (96.163 kg)  BMI 27.98 kg/m2  SpO2 96% Physical Exam  Constitutional: He is oriented to person, place, and time. He appears well-developed and well-nourished. No distress.  HENT:  Head: Normocephalic and atraumatic.  Right Ear: Tympanic membrane and ear canal normal.  Left Ear: Tympanic membrane and ear canal normal.  Mouth/Throat: Uvula is midline and mucous membranes are normal. No trismus in the jaw. No uvula swelling. Posterior oropharyngeal edema and posterior oropharyngeal erythema present. No oropharyngeal exudate or tonsillar abscesses.  Neck: Normal range of motion. Neck supple.  Cardiovascular: Normal rate, regular rhythm and normal heart sounds.   Pulmonary/Chest: Effort normal. No respiratory distress. He has no wheezes. He has no rales.  Coarse lung sounds bilaterally.    Abdominal: There is no splenomegaly. There is no tenderness.  Musculoskeletal: Normal range of motion.  Lymphadenopathy:    He has no cervical adenopathy.  Neurological: He is alert and oriented to person, place, and time. He exhibits normal muscle tone. Coordination normal.  Skin: Skin is warm and dry.  Nursing note and vitals reviewed.   ED Course  Procedures (including critical care time) Labs Review Labs Reviewed - No data to display  Imaging Review No results found.   EKG Interpretation None      MDM   Final diagnoses:  Pharyngitis  Cough    Pt is well appearing, non-toxic.  Airway patent.  No PTA.  Agrees to close PMD f/u if needed.  Appears stable for d/c    Kem Parkinson,  PA-C 09/15/14 [place], MD 09/17/14 [phone]

## 2014-09-28 ENCOUNTER — Other Ambulatory Visit: Payer: Self-pay | Admitting: *Deleted

## 2014-09-28 ENCOUNTER — Telehealth: Payer: Self-pay | Admitting: Orthopedic Surgery

## 2014-09-28 MED ORDER — HYDROCODONE-ACETAMINOPHEN 7.5-325 MG PO TABS: 1.0000 | ORAL_TABLET | Freq: Four times a day (QID) | ORAL | Status: DC | PRN

## 2014-09-28 NOTE — Telephone Encounter (Signed)
Prescription refill printed  Too early to inject  Any further recommendations?

## 2014-09-28 NOTE — Telephone Encounter (Signed)
Patient stopped in office; requests refill of medication: HYDROcodone-acetaminophen (NORCO) 7.5-325 MG per tablet [id number] - states the injection and medication per office visit 09/01/14 helped but left shoulder still hurting sometimes.  His visit notes indicated to return as needed.  Please advise.  His ph# is [phone].

## 2014-09-29 NOTE — Telephone Encounter (Signed)
Patient picked up Rx

## 2014-09-29 NOTE — Telephone Encounter (Signed)
ibuprfoen 800 tid # 90

## 2014-09-29 NOTE — Telephone Encounter (Signed)
Prescription available, patient aware  

## 2014-10-26 ENCOUNTER — Telehealth: Payer: Self-pay | Admitting: Orthopedic Surgery

## 2014-10-26 ENCOUNTER — Other Ambulatory Visit: Payer: Self-pay | Admitting: *Deleted

## 2014-10-26 MED ORDER — HYDROCODONE-ACETAMINOPHEN 7.5-325 MG PO TABS: 1.0000 | ORAL_TABLET | Freq: Four times a day (QID) | ORAL | Status: DC | PRN

## 2014-10-26 NOTE — Telephone Encounter (Signed)
Patient is requesting a refill on pain medication HYDROcodone-acetaminophen (NORCO) 7.5-325 MG per tab, please advise?

## 2014-10-27 NOTE — Telephone Encounter (Signed)
Patient picked up Rx

## 2014-11-29 ENCOUNTER — Other Ambulatory Visit (HOSPITAL_COMMUNITY): Payer: Self-pay | Admitting: Family Medicine

## 2014-11-29 DIAGNOSIS — R2232 Localized swelling, mass and lump, left upper limb: Secondary | ICD-10-CM

## 2014-12-05 ENCOUNTER — Ambulatory Visit (INDEPENDENT_AMBULATORY_CARE_PROVIDER_SITE_OTHER): Payer: Medicaid Other | Admitting: Orthopedic Surgery

## 2014-12-05 ENCOUNTER — Ambulatory Visit (HOSPITAL_COMMUNITY): Payer: Medicaid Other

## 2014-12-05 VITALS — BP 190/110 | Ht 73.0 in | Wt 207.0 lb

## 2014-12-05 DIAGNOSIS — M75102 Unspecified rotator cuff tear or rupture of left shoulder, not specified as traumatic: Secondary | ICD-10-CM | POA: Diagnosis not present

## 2014-12-05 MED ORDER — HYDROCODONE-ACETAMINOPHEN 7.5-325 MG PO TABS: 1.0000 | ORAL_TABLET | Freq: Four times a day (QID) | ORAL | Status: DC | PRN

## 2014-12-05 MED ORDER — HYDROCODONE-ACETAMINOPHEN 5-325 MG PO TABS: 1.0000 | ORAL_TABLET | Freq: Four times a day (QID) | ORAL | Status: DC | PRN

## 2014-12-05 NOTE — Patient Instructions (Signed)
Joint Injection  Care After  Refer to this sheet in the next few days. These instructions provide you with information on caring for yourself after you have had a joint injection. Your caregiver also may give you more specific instructions. Your treatment has been planned according to current medical practices, but problems sometimes occur. Call your caregiver if you have any problems or questions after your procedure.  After any type of joint injection, it is not uncommon to experience:  · Soreness, swelling, or bruising around the injection site.  · Mild numbness, tingling, or weakness around the injection site caused by the numbing medicine used before or with the injection.  It also is possible to experience the following effects associated with the specific agent after injection:  · Iodine-based contrast agents:  ¨ Allergic reaction (itching, hives, widespread redness, and swelling beyond the injection site).  · Corticosteroids (These effects are rare.):  ¨ Allergic reaction.  ¨ Increased blood sugar levels (If you have diabetes and you notice that your blood sugar levels have increased, notify your caregiver).  ¨ Increased blood pressure levels.  ¨ Mood swings.  · Hyaluronic acid in the use of viscosupplementation.  ¨ Temporary heat or redness.  ¨ Temporary rash and itching.  ¨ Increased fluid accumulation in the injected joint.  These effects all should resolve within a day after your procedure.   HOME CARE INSTRUCTIONS  · Limit yourself to light activity the day of your procedure. Avoid lifting heavy objects, bending, stooping, or twisting.  · Take prescription or over-the-counter pain medication as directed by your caregiver.  · You may apply ice to your injection site to reduce pain and swelling the day of your procedure. Ice may be applied 03-04 times:  ¨ Put ice in a plastic bag.  ¨ Place a towel between your skin and the bag.  ¨ Leave the ice on for no longer than 15-20 minutes each time.  SEEK  IMMEDIATE MEDICAL CARE IF:   · Pain and swelling get worse rather than better or extend beyond the injection site.  · Numbness does not go away.  · Blood or fluid continues to leak from the injection site.  · You have chest pain.  · You have swelling of your face or tongue.  · You have trouble breathing or you become dizzy.  · You develop a fever, chills, or severe tenderness at the injection site that last longer than 1 day.  MAKE SURE YOU:  · Understand these instructions.  · Watch your condition.  · Get help right away if you are not doing well or if you get worse.  Document Released: 10/11/2010 Document Revised: 04/22/2011 Document Reviewed: 10/11/2010  [employer]® Patient Information ©2015 [employer]. This information is not intended to replace advice given to you by your health care provider. Make sure you discuss any questions you have with your health care provider.

## 2014-12-05 NOTE — Progress Notes (Signed)
Patient ID: Cory Weber, male   DOB: 1950/06/11, 64 y.o.   MRN: [id number]  Follow up visit  Chief Complaint  Patient presents with  . Follow-up    follow up left shoulder + repeat injection    BP 190/110 mmHg  Ht 6\' 1"  (1.854 m)  Wt 207 lb (93.895 kg)  BMI 27.32 kg/m2  No diagnosis found.   64 year old male fell in his yard sometime in April we treated him with injection for impingement syndrome he got better pain came back he requests second injection left shoulder  Procedure note the subacromial injection shoulder left   Verbal consent was obtained to inject the  Left   Shoulder  Timeout was completed to confirm the injection site is a subacromial space of the  left  shoulder  Medication used Depo-Medrol 40 mg and lidocaine 1% 3 cc  Anesthesia was provided by ethyl chloride  The injection was performed in the left  posterior subacromial space. After pinning the skin with alcohol and anesthetized the skin with ethyl chloride the subacromial space was injected using a 20-gauge needle. There were no complications  Sterile dressing was applied.

## 2014-12-09 ENCOUNTER — Ambulatory Visit (HOSPITAL_COMMUNITY): Payer: Medicaid Other

## 2014-12-21 ENCOUNTER — Ambulatory Visit: Payer: Medicaid Other | Admitting: Gastroenterology

## 2014-12-26 ENCOUNTER — Other Ambulatory Visit: Payer: Self-pay | Admitting: *Deleted

## 2014-12-26 ENCOUNTER — Telehealth: Payer: Self-pay | Admitting: Orthopedic Surgery

## 2014-12-26 MED ORDER — HYDROCODONE-ACETAMINOPHEN 5-325 MG PO TABS: 1.0000 | ORAL_TABLET | Freq: Four times a day (QID) | ORAL | Status: DC | PRN

## 2014-12-26 NOTE — Telephone Encounter (Signed)
Call from patient, requests refill of medication: HYDROcodone-acetaminophen ([place]) 7.5-325 MG tablet [id number]  - ph# [phone]

## 2014-12-27 ENCOUNTER — Other Ambulatory Visit: Payer: Self-pay | Admitting: *Deleted

## 2014-12-27 MED ORDER — HYDROCODONE-ACETAMINOPHEN 7.5-325 MG PO TABS: 1.0000 | ORAL_TABLET | Freq: Four times a day (QID) | ORAL | Status: DC | PRN

## 2014-12-27 NOTE — Telephone Encounter (Signed)
Prescription available, patient aware  

## 2014-12-27 NOTE — Telephone Encounter (Signed)
I called patient and made him aware that his hydrocodone is ready to be picked up, patient states he will come today and get it I made him aware we go to lunch from 12:30-1:30.

## 2015-01-11 ENCOUNTER — Ambulatory Visit: Payer: Medicaid Other | Admitting: Gastroenterology

## 2015-01-23 ENCOUNTER — Telehealth: Payer: Self-pay | Admitting: Orthopedic Surgery

## 2015-01-23 ENCOUNTER — Other Ambulatory Visit: Payer: Self-pay | Admitting: *Deleted

## 2015-01-23 MED ORDER — HYDROCODONE-ACETAMINOPHEN 5-325 MG PO TABS: 1.0000 | ORAL_TABLET | Freq: Four times a day (QID) | ORAL | Status: DC | PRN

## 2015-01-23 NOTE — Telephone Encounter (Signed)
Prescription available, called patient, no answer

## 2015-01-23 NOTE — Telephone Encounter (Signed)
Patient called for refill of pain medication: HYDROcodone-acetaminophen (NORCO/VICODIN) 5-325 MG tablet [id number]  - his ph# is [phone]-please advise.

## 2015-01-24 NOTE — Telephone Encounter (Signed)
Reached patient; aware prescription ready for pick-up.

## 2015-01-26 NOTE — Telephone Encounter (Signed)
Patient aware; picked up prescription 01/23/15

## 2015-01-30 ENCOUNTER — Encounter: Payer: Self-pay | Admitting: Gastroenterology

## 2015-01-30 ENCOUNTER — Telehealth: Payer: Self-pay | Admitting: Gastroenterology

## 2015-01-30 ENCOUNTER — Ambulatory Visit: Payer: Medicaid Other | Admitting: Gastroenterology

## 2015-01-30 NOTE — Telephone Encounter (Signed)
LETTER MAILED

## 2015-01-30 NOTE — Telephone Encounter (Signed)
Pt was a no show

## 2015-02-06 ENCOUNTER — Emergency Department (HOSPITAL_COMMUNITY)
Admission: EM | Admit: 2015-02-06 | Discharge: 2015-02-06 | Disposition: A | Discharge status: Home / Self Care | Payer: Medicaid Other | Attending: Emergency Medicine | Admitting: Emergency Medicine

## 2015-02-06 ENCOUNTER — Encounter (HOSPITAL_COMMUNITY): Payer: Self-pay | Admitting: *Deleted

## 2015-02-06 DIAGNOSIS — I10 Essential (primary) hypertension: Secondary | ICD-10-CM | POA: Diagnosis not present

## 2015-02-06 DIAGNOSIS — D649 Anemia, unspecified: Secondary | ICD-10-CM | POA: Diagnosis not present

## 2015-02-06 DIAGNOSIS — J45909 Unspecified asthma, uncomplicated: Secondary | ICD-10-CM | POA: Insufficient documentation

## 2015-02-06 DIAGNOSIS — R112 Nausea with vomiting, unspecified: Secondary | ICD-10-CM | POA: Diagnosis present

## 2015-02-06 DIAGNOSIS — E785 Hyperlipidemia, unspecified: Secondary | ICD-10-CM | POA: Insufficient documentation

## 2015-02-06 DIAGNOSIS — Z862 Personal history of diseases of the blood and blood-forming organs and certain disorders involving the immune mechanism: Secondary | ICD-10-CM | POA: Insufficient documentation

## 2015-02-06 DIAGNOSIS — Z8619 Personal history of other infectious and parasitic diseases: Secondary | ICD-10-CM | POA: Diagnosis not present

## 2015-02-06 DIAGNOSIS — Z7982 Long term (current) use of aspirin: Secondary | ICD-10-CM | POA: Insufficient documentation

## 2015-02-06 DIAGNOSIS — K859 Acute pancreatitis without necrosis or infection, unspecified: Secondary | ICD-10-CM | POA: Diagnosis not present

## 2015-02-06 DIAGNOSIS — F17210 Nicotine dependence, cigarettes, uncomplicated: Secondary | ICD-10-CM | POA: Diagnosis not present

## 2015-02-06 DIAGNOSIS — Z79899 Other long term (current) drug therapy: Secondary | ICD-10-CM | POA: Diagnosis not present

## 2015-02-06 DIAGNOSIS — Z8673 Personal history of transient ischemic attack (TIA), and cerebral infarction without residual deficits: Secondary | ICD-10-CM | POA: Insufficient documentation

## 2015-02-06 DIAGNOSIS — E119 Type 2 diabetes mellitus without complications: Secondary | ICD-10-CM | POA: Diagnosis not present

## 2015-02-06 DIAGNOSIS — R109 Unspecified abdominal pain: Secondary | ICD-10-CM

## 2015-02-06 LAB — URINE MICROSCOPIC-ADD ON

## 2015-02-06 LAB — URINALYSIS, ROUTINE W REFLEX MICROSCOPIC
Bilirubin Urine: NEGATIVE
Glucose, UA: 500 mg/dL — AB
Ketones, ur: NEGATIVE mg/dL
Nitrite: NEGATIVE
Protein, ur: NEGATIVE mg/dL
Specific Gravity, Urine: >1.03 — ABNORMAL HIGH (ref 1.005–1.030)
pH: 5.5 (ref 5.0–8.0)

## 2015-02-06 LAB — CBC
HCT: 40.4 % (ref 39.0–52.0)
Hemoglobin: 13.8 g/dL (ref 13.0–17.0)
MCH: 30.4 pg (ref 26.0–34.0)
MCHC: 34.2 g/dL (ref 30.0–36.0)
MCV: 89 fL (ref 78.0–100.0)
Platelets: 298 10*3/uL (ref 150–400)
RBC: 4.54 MIL/uL (ref 4.22–5.81)
RDW: 12.8 % (ref 11.5–15.5)
WBC: 13 10*3/uL — ABNORMAL HIGH (ref 4.0–10.5)

## 2015-02-06 LAB — COMPREHENSIVE METABOLIC PANEL
ALT: 30 U/L (ref 17–63)
AST: 28 U/L (ref 15–41)
Albumin: 4.1 g/dL (ref 3.5–5.0)
Alkaline Phosphatase: 53 U/L (ref 38–126)
Anion gap: 11 (ref 5–15)
BUN: 30 mg/dL — ABNORMAL HIGH (ref 6–20)
CO2: 28 mmol/L (ref 22–32)
Calcium: 9.7 mg/dL (ref 8.9–10.3)
Chloride: 88 mmol/L — ABNORMAL LOW (ref 101–111)
Creatinine, Ser: 1.56 mg/dL — ABNORMAL HIGH (ref 0.61–1.24)
GFR calc Af Amer: 53 mL/min — ABNORMAL LOW (ref >60)
GFR calc non Af Amer: 46 mL/min — ABNORMAL LOW (ref >60)
Glucose, Bld: 302 mg/dL — ABNORMAL HIGH (ref 65–99)
Potassium: 3.9 mmol/L (ref 3.5–5.1)
Sodium: 127 mmol/L — ABNORMAL LOW (ref 135–145)
Total Bilirubin: 0.8 mg/dL (ref 0.3–1.2)
Total Protein: 9 g/dL — ABNORMAL HIGH (ref 6.5–8.1)

## 2015-02-06 LAB — LIPASE, BLOOD: Lipase: 190 U/L — ABNORMAL HIGH (ref 11–51)

## 2015-02-06 MED ORDER — HYDROMORPHONE HCL 1 MG/ML IJ SOLN
1.0000 mg | Freq: Once | INTRAMUSCULAR | Status: AC
  Administered 2015-02-06: 1 mg via INTRAVENOUS
  Filled 2015-02-06: qty 1

## 2015-02-06 MED ORDER — CEPHALEXIN 500 MG PO CAPS: 500.0000 mg | ORAL_CAPSULE | Freq: Three times a day (TID) | ORAL | Status: DC

## 2015-02-06 MED ORDER — ONDANSETRON HCL 4 MG/2ML IJ SOLN
4.0000 mg | Freq: Once | INTRAMUSCULAR | Status: AC
  Administered 2015-02-06: 4 mg via INTRAMUSCULAR
  Filled 2015-02-06: qty 2

## 2015-02-06 MED ORDER — FAMOTIDINE IN NACL 20-0.9 MG/50ML-% IV SOLN
20.0000 mg | Freq: Once | INTRAVENOUS | Status: AC
  Administered 2015-02-06: 20 mg via INTRAVENOUS
  Filled 2015-02-06: qty 50

## 2015-02-06 MED ORDER — SODIUM CHLORIDE 0.9 % IV BOLUS (SEPSIS)
1000.0000 mL | Freq: Once | INTRAVENOUS | Status: AC
  Administered 2015-02-06: 1000 mL via INTRAVENOUS

## 2015-02-06 MED ORDER — ONDANSETRON HCL 4 MG PO TABS: 4.0000 mg | ORAL_TABLET | Freq: Four times a day (QID) | ORAL | Status: DC

## 2015-02-06 MED ORDER — OXYCODONE-ACETAMINOPHEN 5-325 MG PO TABS: 1.0000 | ORAL_TABLET | ORAL | Status: DC | PRN

## 2015-02-06 MED ORDER — DEXTROSE 5 % IV SOLN
1.0000 g | Freq: Once | INTRAVENOUS | Status: AC
  Administered 2015-02-06: 1 g via INTRAVENOUS
  Filled 2015-02-06: qty 10

## 2015-02-06 MED ORDER — SUCRALFATE 1 GM/10ML PO SUSP: 1.0000 g | Freq: Three times a day (TID) | ORAL | Status: DC

## 2015-02-06 NOTE — ED Notes (Signed)
Pt comes in with emesis starting yesterday, pt had one episode of diarrhea yesterday. Pt states he had abdominal discomfort for the last 3 days. NAD noted.

## 2015-02-06 NOTE — ED Notes (Signed)
Patient with no complaints at this time. Respirations even and unlabored. Skin warm/dry. Discharge instructions reviewed with patient at this time. Patient given opportunity to voice concerns/ask questions. IV removed per policy and band-aid applied to site. Patient discharged at this time and left Emergency Department with steady gait.  

## 2015-02-06 NOTE — ED Provider Notes (Signed)
History  By signing my name below, I, Cory Weber, attest that this documentation has been prepared under the direction and in the presence of Virgel Manifold, MD. Electronically Signed: Marlowe Weber, ED Scribe. 02/06/2015. 10:32 AM.  Chief Complaint  Patient presents with  . Emesis   The history is provided by the patient and medical records. No language interpreter was used.    HPI Comments:  Cory Weber is a 64 y.o. male, with PMHx of DM, HTN and Hepatitis C who presents to the Emergency Department complaining of upper left-sided abdominal pain that began three days ago. He reports associated nausea, mild vomiting and loose stool yesterday. He reports decreased appetite for the past three days ago. He reports increased urination. Pt reports having guests in his house and believes they may have been sick as well. He states he took some Epsom salt for treatment with no significant relief of the pain. He denies modifying factors. He denies fever or chills. He denies any abdominal surgeries.  He reports left shoulder pain for the past several months. He states he was supposed to have an ultrasound of the area but has not had it done yet. Pt reports he has seen his PCP, Dr. Berdine Addison. Pt states he feels as if the area is getting larger. He denies alcohol use and reports "occasional" smoking.   Past Medical History  Diagnosis Date  . Diabetes mellitus   . Hypertension   . Hepatitis C     states he was diagnosed in 2007 or 2007 while living in [place], [place]  . CVA (cerebral infarction) 01/2011  . Hyperlipidemia   . Asthma   . PVD (peripheral vascular disease) ([place])   . Anemia   . Stroke ([place])   . [id number])    Past Surgical History  Procedure Laterality Date  . Liver biopsy  2005    Done in [place], [place]. Chronic hepatitis with mild periportal inflammation, lobular unicellular necrosis and portal fibrosis. Grade 2, stage 1-2.  [facility] Colonoscopy with propofol N/A 09/03/2012   [id number] polyp-removed as outlined above. Prominent internal hemorrhoids. Tubular adenoma  . Esophagogastroduodenoscopy (egd) with propofol N/A 09/03/2012    [id number] hernia. Gastric diverticulum. Gastric ulcers with associated erosions. Duodenal erosions. Status post gastric biopsy. H.PYLORI gastritis   . Esophageal biopsy N/A 09/03/2012    Procedure: BIOPSY;  Surgeon: Daneil Dolin, MD;  Location: [facility];  Service: Endoscopy;  Laterality: N/A;  gastric and gastric mucosa  . Polypectomy N/A 09/03/2012    Procedure: POLYPECTOMY;  Surgeon: Daneil Dolin, MD;  Location: [facility];  Service: Endoscopy;  Laterality: N/A;  cecal polyp  . Esophagogastroduodenoscopy (egd) with propofol N/A 12/03/2012    Dr. Gala Romney: gastric diverticulum, gastric erosions and scar. Previously noted gastric ulcer completed healed. Biopsy without H.pylori.   . Esophageal biopsy N/A 12/03/2012    Procedure: BIOPSY;  Surgeon: Daneil Dolin, MD;  Location: [facility];  Service: Endoscopy;  Laterality: N/A;  . Maximum access (mas)posterior lumbar interbody fusion (plif) 1 level N/A 11/25/2013    Procedure: FOR MAXIMUM ACCESS (MAS) POSTERIOR LUMBAR INTERBODY FUSION (PLIF) 1 LEVEL;  Surgeon: Eustace Moore, MD;  Location: [facility];  Service: Neurosurgery;  Laterality: N/A;  FOR MAXIMUM ACCESS (MAS) POSTERIOR LUMBAR INTERBODY FUSION (PLIF) 1 LEVEL LUMBAR 3-4   Family History  Problem Relation Age of Onset  . Breast cancer Mother     deceased  . Cancer Mother   . Diabetes Father   . Hypertension Father   .  Heart disease Father     deceased  . Hyperlipidemia Father   . Diabetes Sister   . Hypertension Sister   . Breast cancer Sister   . Colon cancer Neg Hx   . Liver disease Neg Hx    Social History  Substance Use Topics  . Smoking status: Current Some Day Smoker -- 0.50 packs/day for 20 years    Types: Cigarettes  . Smokeless tobacco: Never Used     Comment: given 1-800-Quit Now  . Alcohol Use: No    Review of  Systems  Constitutional: Positive for appetite change.  Gastrointestinal: Positive for nausea, vomiting and abdominal pain.  Genitourinary: Positive for frequency.  All other systems reviewed and are negative.   Allergies  Review of patient's allergies indicates no known allergies.  Home Medications   Prior to Admission medications   Medication Sig Start Date End Date Taking? Authorizing Provider  Alum & Mag Hydroxide-Simeth (MAGIC MOUTHWASH W/LIDOCAINE) SOLN Take 5 mLs by mouth 3 (three) times daily as needed for mouth pain. Swish and spit, do not swallow 09/14/14   Tammy Triplett, PA-C  amLODipine (NORVASC) 5 MG tablet Take 5 mg by mouth daily.    Historical Provider, MD  aspirin EC 81 MG tablet Take 81 mg by mouth daily.    Historical Provider, MD  azithromycin (ZITHROMAX) 250 MG tablet Take 1 tablet (250 mg total) by mouth daily. Take first 2 tablets together, then 1 every day until finished. 09/14/14   Tammy Triplett, PA-C  esomeprazole (NEXIUM) 40 MG capsule TAKE ONE CAPSULE BY MOUTH DAILY BEFORE BREAKFAST. Patient taking differently: Take 40 mg by mouth daily. TAKE ONE CAPSULE BY MOUTH DAILY BEFORE BREAKFAST. 12/03/13   Lavon Paganini Angiulli, PA-C  ferrous sulfate 325 (65 FE) MG tablet Take 325 mg by mouth 2 (two) times daily.    Historical Provider, MD  gabapentin (NEURONTIN) 300 MG capsule Take 1 capsule (300 mg total) by mouth 2 (two) times daily. 12/03/13   Daniel J Angiulli, PA-C  GLIPIZIDE PO Take by mouth.    Historical Provider, MD  HYDROcodone-acetaminophen (NORCO/VICODIN) 5-325 MG tablet Take 1 tablet by mouth every 6 (six) hours as needed for moderate pain. 01/23/15   Carole Civil, MD  lisinopril-hydrochlorothiazide (PRINZIDE,ZESTORETIC) 20-12.5 MG per tablet Take 1 tablet by mouth 2 (two) times daily. 12/03/13   Lavon Paganini Angiulli, PA-C  metFORMIN (GLUCOPHAGE) 1000 MG tablet Take 1 tablet (1,000 mg total) by mouth 2 (two) times daily with a meal. 12/03/13   Lavon Paganini Angiulli,  PA-C  methocarbamol (ROBAXIN) 500 MG tablet TAKE ONE TABLET BY MOUTH EVERY 6 HOURS AS NEEDED FOR MUSCLE SPASMS. 06/10/14   Meredith Staggers, MD  simvastatin (ZOCOR) 20 MG tablet TAKE ONE TABLET BY MOUTH AT 6PM. 04/27/14   Herminio Commons, MD  sucralfate (CARAFATE) 1 GM/10ML suspension Take 10 mLs (1 g total) by mouth 4 (four) times daily. 04/13/14   Orvil Feil, NP  traMADol (ULTRAM) 50 MG tablet Take 1 tablet (50 mg total) by mouth every 6 (six) hours as needed. 09/14/14   Tammy Triplett, PA-C   Triage Vitals: BP 117/81 mmHg  Pulse 128  Temp(Src) 97.9 F (36.6 C) (Oral)  Resp 16  Ht 6' (1.829 m)  Wt 212 lb (96.163 kg)  BMI 28.75 kg/m2  SpO2 95% Physical Exam  Constitutional: He is oriented to person, place, and time. He appears well-developed and well-nourished.  HENT:  Head: Normocephalic.  Eyes: EOM are normal.  Neck:  Normal range of motion.  Cardiovascular: Normal rate, regular rhythm and normal heart sounds.  Exam reveals no gallop and no friction rub.   No murmur heard. Pulmonary/Chest: Effort normal and breath sounds normal. No respiratory distress. He has no wheezes. He has no rales.  Abdominal: He exhibits no distension. There is tenderness (mild left sided). There is no rebound and no guarding.  Musculoskeletal: Normal range of motion. He exhibits no tenderness.  3 cm rounded nontender subcutaneous mass. Mobile.  Neurological: He is alert and oriented to person, place, and time.  Psychiatric: He has a normal mood and affect.  Nursing note and vitals reviewed.   ED Course  Procedures (including critical care time) DIAGNOSTIC STUDIES: Oxygen Saturation is 95% on RA, adequate by my interpretation.   COORDINATION OF CARE: 10:32 AM- Will order labs and urinalysis. Pt verbalizes understanding and agrees to plan.  Medications - No data to display  Labs Review Labs Reviewed  LIPASE, BLOOD - Abnormal; Notable for the following:    Lipase 190 (*)    All other components  within normal limits  COMPREHENSIVE METABOLIC PANEL - Abnormal; Notable for the following:    Sodium 127 (*)    Chloride 88 (*)    Glucose, Bld 302 (*)    BUN 30 (*)    Creatinine, Ser 1.56 (*)    Total Protein 9.0 (*)    GFR calc non Af Amer 46 (*)    GFR calc Af Amer 53 (*)    All other components within normal limits  CBC - Abnormal; Notable for the following:    WBC 13.0 (*)    All other components within normal limits  URINALYSIS, ROUTINE W REFLEX MICROSCOPIC (NOT AT [facility])    Imaging Review No results found. I have personally reviewed and evaluated these images and lab results as part of my medical decision-making.   EKG Interpretation None      MDM   Final diagnoses:  Abdominal pain, unspecified abdominal location  Acute pancreatitis, unspecified pancreatitis type    64 year old male with likely mild pancreatitis. Treated symptomatically with improvement of symptoms. He is afebrile. Hemodynamically stable. Heart rate has improved with IV fluids and symptomatic control. He is tolerating by mouth. Will change with antibiotics for possible UTI. Some renal impairment noted which is likely prerenal secondary to poor by mouth intake and vomiting. Expect to improve with IV fluids and improvement in nausea.  I personally preformed the services scribed in my presence. The recorded information has been reviewed is accurate. Virgel Manifold, MD.     Virgel Manifold, MD 02/09/15 [phone]

## 2015-02-06 NOTE — Discharge Instructions (Signed)

## 2015-02-08 ENCOUNTER — Telehealth: Payer: Self-pay | Admitting: Lab

## 2015-02-08 LAB — URINE CULTURE: Culture: NO GROWTH

## 2015-02-08 NOTE — Telephone Encounter (Signed)
PCP's contact, Judeen Hammans was notified

## 2015-02-08 NOTE — Telephone Encounter (Signed)
Pt scheduled to have required labs drawn on 02/17/2015.  At that time, he will receive an appmt with Dr. Linus Salmons

## 2015-02-13 ENCOUNTER — Other Ambulatory Visit: Payer: Self-pay | Admitting: Cardiovascular Disease

## 2015-02-17 ENCOUNTER — Other Ambulatory Visit: Payer: Medicaid Other

## 2015-02-17 DIAGNOSIS — B182 Chronic viral hepatitis C: Secondary | ICD-10-CM

## 2015-02-17 LAB — IRON: Iron: 54 ug/dL (ref 50–180)

## 2015-02-18 LAB — HEPATITIS B SURFACE ANTIGEN: HEP B S AG: NEGATIVE

## 2015-02-18 LAB — HEPATITIS A ANTIBODY, TOTAL: Hep A Total Ab: REACTIVE — AB

## 2015-02-18 LAB — HIV ANTIBODY (ROUTINE TESTING W REFLEX): HIV 1&2 Ab, 4th Generation: NONREACTIVE

## 2015-02-18 LAB — PROTIME-INR
INR: 0.97 (ref <1.50)
PROTHROMBIN TIME: 13 s (ref 11.6–15.2)

## 2015-02-18 LAB — HEPATITIS B SURFACE ANTIBODY,QUALITATIVE: HEP B S AB: NEGATIVE

## 2015-02-18 LAB — HEPATITIS B CORE ANTIBODY, TOTAL: Hep B Core Total Ab: REACTIVE — AB

## 2015-02-20 LAB — ANA: ANA: NEGATIVE

## 2015-02-21 ENCOUNTER — Ambulatory Visit (INDEPENDENT_AMBULATORY_CARE_PROVIDER_SITE_OTHER): Payer: Medicaid Other | Admitting: Orthopedic Surgery

## 2015-02-21 VITALS — BP 106/80 | Ht 72.0 in | Wt 212.0 lb

## 2015-02-21 DIAGNOSIS — M75102 Unspecified rotator cuff tear or rupture of left shoulder, not specified as traumatic: Secondary | ICD-10-CM

## 2015-02-21 MED ORDER — HYDROCODONE-ACETAMINOPHEN 5-325 MG PO TABS: 1.0000 | ORAL_TABLET | Freq: Four times a day (QID) | ORAL | Status: DC | PRN

## 2015-02-21 NOTE — Patient Instructions (Signed)
Call [facility] therapy dept to schedule therapy

## 2015-02-21 NOTE — Progress Notes (Signed)
Patient ID: Cory Weber, male   DOB: 1950/12/11, 65 y.o.   MRN: [id number]  Follow up visit  Chief Complaint  Patient presents with  . Shoulder Pain    Left shoulder pain, referred by Dr. Berdine Addison for eval.    BP 106/80 mmHg  Ht 6' (1.829 m)  Wt 212 lb (96.163 kg)  BMI 28.75 kg/m2  Encounter Diagnosis  Name Primary?  . Rotator cuff syndrome, left Yes    He presents back for reevaluation of his left shoulder. He has gotten injections and hydrocodone and seems to get relief from that however opioid therapy is not a good treatment for rotator cuff syndrome.  He's not had physical therapy  He has no radicular symptoms in the store cervical spine pain  Exam shows an awake alert oriented 3 male mood affect normal appearance normal grooming normal vital signs stable as recorded  Painful forward elevation no instability positive impingement sign skin normal  Injection subacromial space  Physical therapy for the next 8 weeks  I gave him 60 hydrocodone and due to the pain clinic if he was to you status therapy  Follow-up 8 weeks if no improvement MRI advised.  Procedure note the subacromial injection shoulder left   Verbal consent was obtained to inject the  Left   Shoulder  Timeout was completed to confirm the injection site is a subacromial space of the  left  shoulder  Medication used Depo-Medrol 40 mg and lidocaine 1% 3 cc  Anesthesia was provided by ethyl chloride  The injection was performed in the left  posterior subacromial space. After pinning the skin with alcohol and anesthetized the skin with ethyl chloride the subacromial space was injected using a 20-gauge needle. There were no complications  Sterile dressing was applied.

## 2015-02-22 ENCOUNTER — Telehealth: Payer: Self-pay | Admitting: *Deleted

## 2015-02-22 ENCOUNTER — Ambulatory Visit (INDEPENDENT_AMBULATORY_CARE_PROVIDER_SITE_OTHER): Payer: Medicaid Other | Admitting: Internal Medicine

## 2015-02-22 ENCOUNTER — Encounter: Payer: Self-pay | Admitting: Internal Medicine

## 2015-02-22 VITALS — BP 111/81 | HR 123 | Temp 98.1°F | Ht 73.0 in | Wt 208.0 lb

## 2015-02-22 DIAGNOSIS — B182 Chronic viral hepatitis C: Secondary | ICD-10-CM

## 2015-02-22 MED ORDER — LEDIPASVIR-SOFOSBUVIR 90-400 MG PO TABS: 1.0000 | ORAL_TABLET | Freq: Every day | ORAL | Status: DC

## 2015-02-22 NOTE — Telephone Encounter (Signed)
Left patient a voice mail to inform him of appt for an ultrasound at [facility] on 03/03/15 at 10:15 AM. Nothing to eat or drink after midnight. Asked him to call me back to confirm this appointment. Gave to Val Steps for prior authorization. Myrtis Hopping

## 2015-02-22 NOTE — Progress Notes (Signed)
[facility] for Infectious Disease   CC: consideration for treatment for chronic hepatitis C  HPI:  +Cory Weber is a 65 y.o. male who presents for initial evaluation and management of chronic hepatitis C.  Patient tested positive over 10 years ago.  Hepatitis C-associated risk factors present are: none. Patient denies IV drug abuse, renal dialysis, sexual contact with person with liver disease, tattoos. Patient has had other studies performed. Results: hepatitis C RNA by PCR, result: positive. Patient has not had prior treatment for Hepatitis C. Patient does not have a past history of liver disease. Patient does not have a family history of liver disease. Patient does not  have associated signs or symptoms related to liver disease.  Labs reviewed and confirm chronic hepatitis C with a positive viral load.   Records reviewed from PCP.  History of cocaine use, has stopped.  No alcohol, no other drug use.   Was previously evlauated and had liver biopsy, does not recall any significant issues, never treated.     Patient does have documented immunity to Hepatitis A. Patient does have documented immunity to Hepatitis B.    Review of Systems:   Constitutional: positive for fatigue Gastrointestinal: negative for diarrhea Musculoskeletal: negative for myalgias and arthralgias All other systems reviewed and are negative      Past Medical History  Diagnosis Date  . Diabetes mellitus   . Hypertension   . Hepatitis C     states he was diagnosed in 2007 or 2007 while living in [place], [place]  . CVA (cerebral infarction) 01/2011  . Hyperlipidemia   . Asthma   . PVD (peripheral vascular disease) ([place])   . Anemia   . Stroke ([place])   . Headache(784.0)     Prior to Admission medications   Medication Sig Start Date End Date Taking? Authorizing Provider  aspirin EC 81 MG tablet Take 81 mg by mouth daily.   Yes Historical Provider, MD  ferrous sulfate 325 (65 FE) MG tablet Take 325 mg  by mouth 2 (two) times daily.   Yes Historical Provider, MD  gabapentin (NEURONTIN) 300 MG capsule Take 1 capsule (300 mg total) by mouth 2 (two) times daily. 12/03/13  Yes Daniel J Angiulli, PA-C  glipiZIDE (GLUCOTROL) 5 MG tablet Take 2.5 mg by mouth 2 (two) times daily before a meal.   Yes Historical Provider, MD  HYDROcodone-acetaminophen (NORCO/VICODIN) 5-325 MG tablet Take 1 tablet by mouth every 6 (six) hours as needed for moderate pain. 02/21/15  Yes Carole Civil, MD  lisinopril-hydrochlorothiazide (PRINZIDE,ZESTORETIC) 20-12.5 MG per tablet Take 1 tablet by mouth 2 (two) times daily. 12/03/13  Yes Daniel J Angiulli, PA-C  metFORMIN (GLUCOPHAGE) 1000 MG tablet Take 1 tablet (1,000 mg total) by mouth 2 (two) times daily with a meal. 12/03/13  Yes Daniel J Angiulli, PA-C  methocarbamol (ROBAXIN) 500 MG tablet TAKE ONE TABLET BY MOUTH EVERY 6 HOURS AS NEEDED FOR MUSCLE SPASMS. 06/10/14  Yes Meredith Staggers, MD  simvastatin (ZOCOR) 20 MG tablet TAKE ONE TABLET BY MOUTH DAILY AT 6 PM. 02/14/15  Yes Herminio Commons, MD  Ledipasvir-Sofosbuvir (HARVONI) 90-400 MG TABS Take 1 tablet by mouth daily. 02/22/15   Thayer Headings, MD    No Known Allergies  Social History  Substance Use Topics  . Smoking status: Current Every Day Smoker -- 0.50 packs/day for 20 years    Types: Cigarettes  . Smokeless tobacco: Never Used     Comment: given 1-800-Quit  Now  . Alcohol Use: No    Family History  Problem Relation Age of Onset  . Breast cancer Mother     deceased  . Cancer Mother   . Diabetes Father   . Hypertension Father   . Heart disease Father     deceased  . Hyperlipidemia Father   . Diabetes Sister   . Hypertension Sister   . Breast cancer Sister   . Colon cancer Neg Hx   . Liver disease Neg Hx      Objective:  Constitutional: in no apparent distress and alert,  Filed Vitals:   02/22/15 1456  BP: 111/81  Pulse: 123  Temp: 98.1 F (36.7 C)   Eyes:  anicteric Cardiovascular: Cor RRR and No murmurs Respiratory: CTA B; normal respiratory effort Gastrointestinal: Bowel sounds are normal, liver is not enlarged, spleen is not enlarged Musculoskeletal: peripheral pulses normal, no pedal edema, no clubbing or cyanosis Skin: negative for - jaundice, spider hemangioma, telangiectasia, palmar erythema, ecchymosis and atrophy; no porphyria cutanea tarda Lymphatic: no cervical lymphadenopathy   Laboratory Genotype:  Lab Results  Component Value Date   HCVGENOTYPE 1a 05/20/2012   HCV viral load:  Lab Results  Component Value Date   HCVQUANT [id number]* 05/20/2012   Lab Results  Component Value Date   WBC 13.0* 02/06/2015   HGB 13.8 02/06/2015   HCT 40.4 02/06/2015   MCV 89.0 02/06/2015   PLT 298 02/06/2015    Lab Results  Component Value Date   CREATININE 1.56* 02/06/2015   BUN 30* 02/06/2015   NA 127* 02/06/2015   K 3.9 02/06/2015   CL 88* 02/06/2015   CO2 28 02/06/2015    Lab Results  Component Value Date   ALT 30 02/06/2015   AST 28 02/06/2015   ALKPHOS 53 02/06/2015     Labs and history reviewed and show CHILD-PUGH A  5-6 points: Child class A 7-9 points: Child class B 10-15 points: Child class C  Lab Results  Component Value Date   INR 0.97 02/17/2015   BILITOT 0.8 02/06/2015   ALBUMIN 4.1 02/06/2015     Assessment: New Patient with Chronic Hepatitis C genotype 1b, untreated.  I discussed with the patient the lab findings that confirm chronic hepatitis C as well as the natural history and progression of disease including about 30% of people who develop cirrhosis of the liver if left untreated and once cirrhosis is established there is a 2-7% risk per year of liver cancer and liver failure.  I discussed the importance of treatment and benefits in reducing the risk, even if significant liver fibrosis exists.   Plan: 1) Patient counseled extensively on limiting acetaminophen to no more than 2 grams daily,  avoidance of alcohol. 2) Transmission discussed with patient including sexual transmission, sharing razors and toothbrush.   3) Will need referral to gastroenterology if concern for cirrhosis 4) Will need referral for substance abuse counseling: No.; Further work up to include urine drug screen  No. 5) Will prescribe Harvoni for 12 weeks 6) Hepatitis A vaccine No. 7) Hepatitis B vaccine No. 8) Pneumovax vaccine if concern for cirrhosis 9) Further work up to include liver staging with elastography 10) will follow up after starting medication

## 2015-02-22 NOTE — Patient Instructions (Signed)
Date 02/22/2015  Dear Cory Weber, As discussed in the [facility], your hepatitis C therapy will include the following medications:          Harvoni 90mg /400mg  tablet:           Take 1 tablet by mouth once daily   Please note that ALL MEDICATIONS WILL START ON THE SAME DATE for a total of 12 weeks. ---------------------------------------------------------------- Your HCV Treatment Start Date: TBA   Your HCV genotype:  1b    Liver Fibrosis: TBD    ---------------------------------------------------------------- YOUR PHARMACY CONTACT:   [place] [facility] Phone: [phone] Hours: Monday to Friday 7:30 am to 6:00 pm   Please always contact your pharmacy at least 3-4 business days before you run out of medications to ensure your next month's medication is ready or 1 week prior to running out if you receive it by mail.  Remember, each prescription is for 28 days. ---------------------------------------------------------------- GENERAL NOTES REGARDING YOUR HEPATITIS C MEDICATION:  SOFOSBUVIR/LEDIPASVIR (HARVONI): - Harvoni tablet is taken daily with OR without food. - The tablets are orange. - The tablets should be stored at room temperature.  - Acid reducing agents such as H2 blockers (ie. Pepcid (famotidine), Zantac (ranitidine), Tagamet (cimetidine), Axid (nizatidine) and proton pump inhibitors (ie. Prilosec (omeprazole), Protonix (pantoprazole), Nexium (esomeprazole), or Aciphex (rabeprazole)) can decrease effectiveness of Harvoni. Do not take until you have discussed with a health care provider.    -Antacids that contain magnesium and/or aluminum hydroxide (ie. Milk of Magensia, Rolaids, Gaviscon, Maalox, Mylanta, an dArthritis Pain Formula)can reduce absorption of Harvoni, so take them at least 4 hours before or after Harvoni.  -Calcium carbonate (calcium supplements or antacids such as Tums, Caltrate,  Os-Cal)needs to be taken at least 4 hours hours before or after Harvoni.  -St. John's wort or any products that contain St. John's wort like some herbal supplements  Please inform the office prior to starting any of these medications.  - The common side effects associated with Harvoni include:      1. Fatigue      2. Headache      3. Nausea      4. Diarrhea      5. Insomnia  Please note that this only lists the most common side effects and is NOT a comprehensive list of the potential side effects of these medications. For more information, please review the drug information sheets that come with your medication package from the pharmacy.  ---------------------------------------------------------------- GENERAL HELPFUL HINTS ON HCV THERAPY: 1. Stay well-hydrated. 2. Notify the [facility] of any changes in your other over-the-counter/herbal or prescription medications. 3. If you miss a dose of your medication, take the missed dose as soon as you remember. Return to your regular time/dose schedule the next day.  4.  Do not stop taking your medications without first talking with your healthcare provider. 5.  You may take Tylenol (acetaminophen), as long as the dose is less than 2000 mg (OR no more than 4 tablets of the Tylenol Extra Strengths 500mg  tablet) in 24 hours. 6.  You will see our pharmacist-specialist within the first 2 weeks of starting your medication. 7.  You will need to obtain routine labs around week 4 and12 weeks after starting and then 3 to 6 months after finishing Harvoni.    Scharlene Gloss, [place] [facility] [place],  [place]  [id number] [phone]

## 2015-02-23 LAB — HCV RNA, QUANT REAL-TIME PCR W/REFLEX
HCV RNA, PCR, QN (LOG): 6.68 {Log_IU}/mL — AB
HCV RNA, PCR, QN: 4800000 [IU]/mL — AB

## 2015-02-23 LAB — HCV RNA,LIPA RFLX NS5A DRUG RESIST

## 2015-02-25 LAB — HCV RNA NS5A DRUG RESISTANCE

## 2015-02-27 NOTE — Telephone Encounter (Signed)
Patient aware of this appointment.

## 2015-03-01 ENCOUNTER — Ambulatory Visit (HOSPITAL_COMMUNITY): Payer: Medicaid Other | Attending: Orthopedic Surgery | Admitting: Occupational Therapy

## 2015-03-01 ENCOUNTER — Encounter (HOSPITAL_COMMUNITY): Payer: Self-pay | Admitting: Occupational Therapy

## 2015-03-01 DIAGNOSIS — M75102 Unspecified rotator cuff tear or rupture of left shoulder, not specified as traumatic: Secondary | ICD-10-CM | POA: Diagnosis not present

## 2015-03-01 DIAGNOSIS — M25512 Pain in left shoulder: Secondary | ICD-10-CM

## 2015-03-01 NOTE — Patient Instructions (Signed)
1) Seated Row   Sit up straight with elbows by your sides. Pull back with shoulders/elbows, keeping forearms straight, as if pulling back on the reins of a horse. Squeeze shoulder blades together. Repeat _10__times, _1-2___sets/day    2) Shoulder Elevation    Sit up straight with arms by your sides. Slowly bring your shoulders up towards your ears. Repeat_10__times, _1-2___ sets/day    3) Shoulder Extension    Sit up straight with both arms by your side, draw your arms back behind your waist. Keep your elbows straight. Repeat _10___times, _1-2___sets/day.        Perform each exercise ________ reps. 2-3x days.   Protraction - STANDING  Start by holding a wand or cane at chest height.  Next, slowly push the wand outwards in front of your body so that your elbows become fully straightened. Then, return to the original position.     Shoulder FLEXION - STANDING - PALMS UP  In the standing position, hold a wand/cane with both arms, palms up on both sides. Raise up the wand/cane allowing your unaffected arm to perform most of the effort. Your affected arm should be partially relaxed.      Internal/External ROTATION - STANDING  In the standing position, hold a wand/cane with both hands keeping your elbows bent. Move your arms and wand/cane to one side.  Your affected arm should be partially relaxed while your unaffected arm performs most of the effort.       Shoulder ABDUCTION - STANDING  While holding a wand/cane palm face up on the injured side and palm face down on the uninjured side, slowly raise up your injured arm to the side.                     Horizontal Abduction/Adduction      Straight arms holding cane at shoulder height, bring cane to right, center, left. Repeat starting to left.   Copyright  VHI. All rights reserved.          1) Shoulder Protraction    Begin with elbows by your side, slowly "punch" straight out in  front of you keeping arms/elbows straight. Repeat _10__times, _1-2___set/day.     2) Shoulder Flexion  Supine:     Standing:         Begin with arms at your side with thumbs pointed up, slowly raise both arms up and forward towards overhead. Repeat_10__times, _1-2__set/day.               3) Horizontal abduction/adduction  Supine:   Standing:           Begin with arms straight out in front of you, bring out to the side in at "T" shape. Keep arms straight entire time. Repeat _10___times, _1-2___sets/day.                 4) Internal & External Rotation    *No band* -Stand with elbows at the side and elbows bent 90 degrees. Move your forearms away from your body, then bring back inward toward the body.  Repeat _10__times, _1-2__sets/day    5) Shoulder Abduction  Supine:     Standing:       Lying on your back begin with your arms flat on the table next to your side. Slowly move your arms out to the side so that they go overhead, in a jumping jack or snow angel movement. Repeat _10__times, _1-2__sets/day  1) Flexion Wall Stretch    Face wall, place affected handon wall in front of you. Slide hand up the wall  and lean body in towards the wall. Hold for 5 seconds. Repeat 10 times. 1-2 times/day.     2) Towel Stretch with Internal Rotation   Gently pull up your affected arm  behind your back with the assist of a towel            3) Corner Stretch    Stand at a corner of a wall, place your arms on the walls with elbows bent. Lean into the corner until a stretch is felt along the front of your chest and/or shoulders. Hold for 5 seconds. Repeat 10X, 1-2 times/day.    4) Posterior Capsule Stretch    Bring the involved arm across chest. Grasp elbow and pull toward chest until you feel a stretch in the back of the upper arm and shoulder. Hold 5 seconds. Repeat 10X. Complete 1-2 times/day.    5) Scapular  Retraction    Tuck chin back as you pinch shoulder blades together.  Hold 5 seconds. Repeat 10X. Complete 1-2 times/day.

## 2015-03-01 NOTE — Therapy (Signed)
Cory Weber, [place], [address] Phone: [phone]   Fax:  [phone]  Occupational Therapy Evaluation  Patient Details  Name: Cory Weber MRN: [id number] Date of Birth: 1950/06/20 Referring Provider: Dr. Aline Brochure  Encounter Date: 03/01/2015      OT End of Session - 03/01/15 1344    Visit Number 1   Number of Visits 1   Date for OT Re-Evaluation 04/30/15   Authorization Type Medicaid   OT Start Time 1300   OT Stop Time 1341   OT Time Calculation (min) 41 min   Activity Tolerance Patient tolerated treatment well   Behavior During Therapy [facility] for tasks assessed/performed      Past Medical History  Diagnosis Date  . Diabetes mellitus   . Hypertension   . Hepatitis C     states he was diagnosed in 2007 or 2007 while living in [place], [place]  . CVA (cerebral infarction) 01/2011  . Hyperlipidemia   . Asthma   . PVD (peripheral vascular disease) ([place])   . Anemia   . Stroke ([place])   . [id number])     Past Surgical History  Procedure Laterality Date  . Liver biopsy  2005    Done in [place], [place]. Chronic hepatitis with mild periportal inflammation, lobular unicellular necrosis and portal fibrosis. Grade 2, stage 1-2.  Marland Kitchen Colonoscopy with propofol N/A 09/03/2012    [id number] polyp-removed as outlined above. Prominent internal hemorrhoids. Tubular adenoma  . Esophagogastroduodenoscopy (egd) with propofol N/A 09/03/2012    [id number] hernia. Gastric diverticulum. Gastric ulcers with associated erosions. Duodenal erosions. Status post gastric biopsy. H.PYLORI gastritis   . Esophageal biopsy N/A 09/03/2012    Procedure: BIOPSY;  Surgeon: Daneil Dolin, MD;  Location: [facility];  Service: Endoscopy;  Laterality: N/A;  gastric and gastric mucosa  . Polypectomy N/A 09/03/2012    Procedure: POLYPECTOMY;  Surgeon: Daneil Dolin, MD;  Location: [facility];  Service: Endoscopy;  Laterality: N/A;  cecal polyp  .  Esophagogastroduodenoscopy (egd) with propofol N/A 12/03/2012    Dr. Gala Romney: gastric diverticulum, gastric erosions and scar. Previously noted gastric ulcer completed healed. Biopsy without H.pylori.   . Esophageal biopsy N/A 12/03/2012    Procedure: BIOPSY;  Surgeon: Daneil Dolin, MD;  Location: [facility];  Service: Endoscopy;  Laterality: N/A;  . Maximum access (mas)posterior lumbar interbody fusion (plif) 1 level N/A 11/25/2013    Procedure: FOR MAXIMUM ACCESS (MAS) POSTERIOR LUMBAR INTERBODY FUSION (PLIF) 1 LEVEL;  Surgeon: Eustace Moore, MD;  Location: [place] [facility];  Service: Neurosurgery;  Laterality: N/A;  FOR MAXIMUM ACCESS (MAS) POSTERIOR LUMBAR INTERBODY FUSION (PLIF) 1 LEVEL LUMBAR 3-4    There were no vitals filed for this visit.  Visit Diagnosis:  Rotator cuff syndrome, left  Pain in left shoulder      Subjective Assessment - 03/01/15 1342    Subjective  S: I fell on my arm about a year ago, that's when the pain started.    Pertinent History Pt is a 65 y/o male with left shoulder rotator cuff syndrome causing increased pain. Pt reports pain began after a fall approximately 1 year ago. Pt uses pain medication and heat for pain management. Dr. Aline Brochure referred pt to occupational therapy for evaluation and treatment.    Patient Stated Goals To lessen the pain in my arm   Currently in Pain? Yes   Pain Score 4    Pain Location Shoulder   Pain Orientation  Left   Pain Descriptors / Indicators Aching   Pain Type Chronic pain           [facility] OT Assessment - 03/01/15 1259    Assessment   Diagnosis left shoulder rotator cuff syndrome   Referring Provider Dr. Aline Brochure   Onset Date 02/28/14   Prior Therapy None   Precautions   Precautions None   Balance Screen   Has the patient fallen in the past 6 months Yes   How many times? 3   Has the patient had a decrease in activity level because of a fear of falling?  No   Is the patient reluctant to leave their home because of a  fear of falling?  No   Home  Environment   Family/patient expects to be discharged to: Private residence   Prior Function   Level of Independence Independent with basic ADLs   Vocation On disability   Leisure is going to take up exercising-walking, swimming   ADL   ADL comments Pt is having difficulty with dressing, reaching up into high cabinets, lifting lightweight objects, grooming tasks for extended periods of time.    Written Expression   Dominant Hand Left   Cognition   Overall Cognitive Status Within Functional Limits for tasks assessed   ROM / Strength   AROM / PROM / Strength AROM;PROM;Strength   AROM   Overall AROM Comments Assessed seated, ER/IR adducted   AROM Assessment Site Shoulder   Right/Left Shoulder Left   Left Shoulder Flexion 112 Degrees   Left Shoulder ABduction 92 Degrees   Left Shoulder Internal Rotation 90 Degrees   Left Shoulder External Rotation 55 Degrees   PROM   Overall PROM Comments Assessed seated, ER/IR adducted   PROM Assessment Site Shoulder   Right/Left Shoulder Left   Left Shoulder Flexion 165 Degrees   Left Shoulder ABduction 146 Degrees   Left Shoulder Internal Rotation 90 Degrees   Left Shoulder External Rotation 72 Degrees   Strength   Overall Strength Comments Assessed seated, ER/IR adducted   Strength Assessment Site Shoulder   Right/Left Shoulder Left   Left Shoulder Flexion 3+/5   Left Shoulder ABduction 3-/5   Left Shoulder Internal Rotation 4/5   Left Shoulder External Rotation 4/5                         OT Education - 03/01/15 1323    Education provided Yes   Education Details scapular A/ROM, shoulder AA/ROM, shoulder A/ROM, shoulder stretches. Educated pt on timeline of exercises    Person(s) Educated Patient   Methods Explanation;Demonstration;Handout   Comprehension Verbalized understanding;Returned demonstration          OT Short Term Goals - 03/01/15 1347    OT SHORT TERM GOAL #1   Title Pt  will be educated on and independent in HEP.    Time 1   Period Days   Status Achieved                  Plan - 03/01/15 1344    Clinical Impression Statement A: Pt is a 65 y/o male presenting with left rotator cuff syndrome causing increased pain, decreased range of motion, and decreased strength in his LUE. Pt reports he has difficulty with daily tasks and is unable to lift and hold lightweight objects for long periods of time. Pt's insurance is Medicaid, therefore pt was provided with HEP including scapular A/ROM, shoulder AA/ROM, shoulder A/ROM, and  shoulder stretches, along with a timeline for grading exercises.    Pt will benefit from skilled therapeutic intervention in order to improve on the following deficits (Retired) Decreased strength;Pain;Impaired UE functional use;Decreased range of motion;Impaired flexibility   Rehab Potential Good   OT Frequency 1x / week   OT Duration --  1 visit   OT Treatment/Interventions Patient/family education   Plan P: Pt provided with HEP including scapular A/ROM, shoulder AA/ROM and A/ROM, and shoulder stretches to increase functional use of LUE as dominant.         Problem List Patient Active Problem List   Diagnosis Date Noted  . Dyspepsia 04/13/2014  . Acute blood loss anemia 12/01/2013  . Constipation 12/01/2013  . Radiculopathy of lumbar region 11/29/2013  . Fever presenting with conditions classified elsewhere 11/27/2013  . Acute right lumbar radiculopathy 11/26/2013  . S/P lumbar spinal fusion 11/25/2013  . Lumbar spinal stenosis 11/24/2013  . Right leg weakness 11/23/2013  . PUD (peptic ulcer disease) 11/19/2012  . IDA (iron deficiency anemia) 07/10/2012  . Chronic hepatitis C ([place]) 05/13/2012  . Unspecified constipation 05/13/2012  . Peripheral vascular disease, unspecified ([place]) 01/21/2012  . Hepatitis C antibody test positive 12/03/2011  . Normocytic anemia 12/03/2011  . Melena 12/03/2011  . DM (diabetes mellitus),  type 2, uncontrolled ([place]) 02/03/2011  . Accelerated hypertension 02/03/2011  . Hemiparesthesia 02/03/2011  . Polysubstance abuse 02/03/2011  . Tobacco abuse 02/03/2011    Guadelupe Sabin, OTR/L  [phone]  03/01/2015, 1:50 PM  [place] [address] [place], [place], [address] Phone: [phone]   Fax:  [phone]  Name: CORDELL TROIANI MRN: [id number] Date of Birth: [birth year]

## 2015-03-03 ENCOUNTER — Ambulatory Visit (HOSPITAL_COMMUNITY)
Admission: RE | Admit: 2015-03-03 | Discharge: 2015-03-03 | Disposition: A | Discharge status: Home / Self Care | Payer: Medicaid Other | Source: Ambulatory Visit | Attending: Internal Medicine | Admitting: Internal Medicine

## 2015-03-03 DIAGNOSIS — D649 Anemia, unspecified: Secondary | ICD-10-CM | POA: Diagnosis not present

## 2015-03-03 DIAGNOSIS — E119 Type 2 diabetes mellitus without complications: Secondary | ICD-10-CM | POA: Diagnosis not present

## 2015-03-03 DIAGNOSIS — B182 Chronic viral hepatitis C: Secondary | ICD-10-CM | POA: Diagnosis not present

## 2015-03-03 DIAGNOSIS — I700 Atherosclerosis of aorta: Secondary | ICD-10-CM | POA: Insufficient documentation

## 2015-03-06 ENCOUNTER — Telehealth: Payer: Self-pay | Admitting: *Deleted

## 2015-03-06 NOTE — Telephone Encounter (Signed)
Patient calling for ultrasound results, please advise. He would like to know when he is supposed to follow back up at [facility]. RN advised patient that he would get a call regarding an appointment after we get approval/denial from his insurance company regarding treatment. Landis Gandy, RN

## 2015-03-07 NOTE — Telephone Encounter (Signed)
C/w early cirrhosis.  Likely means he will get treatment.  thanks

## 2015-03-07 NOTE — Telephone Encounter (Signed)
Pt notified.  He will wait for a call to schedule follow up.

## 2015-03-10 MED FILL — HARVONI 90-400 MG TABLET: 90-400 | 28 days supply | Qty: 28 | Fill #0

## 2015-03-13 ENCOUNTER — Other Ambulatory Visit: Payer: Self-pay | Admitting: *Deleted

## 2015-03-13 ENCOUNTER — Telehealth: Payer: Self-pay | Admitting: Orthopedic Surgery

## 2015-03-13 NOTE — Telephone Encounter (Signed)
Patient called, requests refill on medication: HYDROcodone-acetaminophen (NORCO/VICODIN) 5-325 MG tablet [id number]  - ph# [phone]

## 2015-03-14 ENCOUNTER — Other Ambulatory Visit: Payer: Self-pay | Admitting: *Deleted

## 2015-03-14 ENCOUNTER — Encounter: Payer: Self-pay | Admitting: Pharmacy Technician

## 2015-03-14 MED ORDER — HYDROCODONE-ACETAMINOPHEN 5-325 MG PO TABS: 1.0000 | ORAL_TABLET | Freq: Four times a day (QID) | ORAL | Status: DC | PRN

## 2015-03-15 NOTE — Telephone Encounter (Signed)
Prescription available, patient aware  

## 2015-03-30 ENCOUNTER — Ambulatory Visit: Payer: Medicaid Other | Admitting: Pharmacist Clinician (PhC)/ Clinical Pharmacy Specialist

## 2015-03-30 NOTE — Patient Instructions (Signed)
Cont Harvoni x 3 months Come back for lab to get the extension Dr. Linus Salmons in April

## 2015-03-30 NOTE — Progress Notes (Signed)
HPI: Cory Weber is a 65 y.o. male who is here for his pharmacy Hep C f/u.  Lab Results  Component Value Date   HCVGENOTYPE 1a 05/20/2012    Allergies: No Known Allergies  Vitals:    Past Medical History: Past Medical History  Diagnosis Date  . Diabetes mellitus   . Hypertension   . Hepatitis C     states he was diagnosed in 2007 or 2007 while living in [place], [place]  . CVA (cerebral infarction) 01/2011  . Hyperlipidemia   . Asthma   . PVD (peripheral vascular disease) ([place])   . Anemia   . Stroke ([place])   . Headache(784.0)     Social History: Social History   Social History  . Marital Status: Single    Spouse Name: N/A  . Number of Children: 1  . Years of Education: N/A   Occupational History  . trying to get disability    Social History Main Topics  . Smoking status: Current Every Day Smoker -- 0.50 packs/day for 20 years    Types: Cigarettes  . Smokeless tobacco: Never Used     Comment: given 1-800-Quit Now  . Alcohol Use: No  . Drug Use: No     Comment: last used April 2016.  Marland Kitchen Sexual Activity: Not on file   Other Topics Concern  . Not on file   Social History Narrative    Labs: HEP B S AB (no units)  Date Value  02/17/2015 NEG   HEPATITIS B SURFACE AG (no units)  Date Value  02/17/2015 NEGATIVE    Lab Results  Component Value Date   HCVGENOTYPE 1a 05/20/2012    Hepatitis C RNA quantitative Latest Ref Rng 05/20/2012  HCV Quantitative <15 IU/mL [id number])  HCV Quantitative Log <1.18 log 10 7.06(H)    AST (U/L)  Date Value  02/06/2015 28  04/01/2014 19  11/30/2013 20  10/31/2011 26   ALT (U/L)  Date Value  02/06/2015 30  04/01/2014 21  11/30/2013 15   INR (no units)  Date Value  02/17/2015 0.97  11/23/2013 1.09  05/20/2012 1.04    CrCl: CrCl cannot be calculated (Unknown ideal weight.).  Fibrosis Score: F3/4 as assessed by [facility]FI  Child-Pugh Score: Class A  Previous Treatment  Regimen: Naive  Assessment: Vyron is doing very well with his Harvoni. He is about 2.5 weeks out now with no side effects or miss doses. Iron is listed on his profile but he is not taking it. It could post an issue with Harvoni but not in this case he is not currently taking. Told him to avoid taking it at the same time if he was to restart it. He has medicaid so we'll need the VL to get the extension. He is going to come back at the end of the month to get lab and f/u with Dr. Linus Salmons in April. He is going to arrange transportation since he lives in [place].   Recommendations:  Cont Harvoni 1 PO qday x3 mo Hep C VL at the end of the month F/u with Dr. Linus Salmons in April  Diamantina Edinger, [place], [place].D., BCPS, AAHIVP Clinical Infectious [facility] 03/30/2015, 9:43 AM

## 2015-04-04 ENCOUNTER — Other Ambulatory Visit: Payer: Self-pay | Admitting: *Deleted

## 2015-04-04 ENCOUNTER — Encounter (HOSPITAL_COMMUNITY): Payer: Self-pay

## 2015-04-04 ENCOUNTER — Telehealth: Payer: Self-pay | Admitting: Orthopedic Surgery

## 2015-04-04 MED ORDER — HYDROCODONE-ACETAMINOPHEN 5-325 MG PO TABS: 1.0000 | ORAL_TABLET | Freq: Four times a day (QID) | ORAL | Status: DC | PRN

## 2015-04-04 NOTE — Telephone Encounter (Signed)
Patient called for refill of medication (please advise patient, and advise if letter is needed:   HYDROcodone-acetaminophen (NORCO/VICODIN) 5-325 MG tablet [id number]

## 2015-04-05 NOTE — Telephone Encounter (Signed)
Prescription available, patient aware  

## 2015-04-06 MED FILL — HARVONI 90-400 MG TABLET: 90-400 | 28 days supply | Qty: 28 | Fill #1

## 2015-04-11 ENCOUNTER — Other Ambulatory Visit: Payer: Medicaid Other

## 2015-04-11 DIAGNOSIS — B182 Chronic viral hepatitis C: Secondary | ICD-10-CM

## 2015-04-12 LAB — HEPATITIS C RNA QUANTITATIVE: HCV Quantitative: <15 IU/mL (ref <15)

## 2015-04-24 ENCOUNTER — Encounter: Payer: Self-pay | Admitting: Orthopedic Surgery

## 2015-04-24 ENCOUNTER — Ambulatory Visit (INDEPENDENT_AMBULATORY_CARE_PROVIDER_SITE_OTHER): Payer: Medicaid Other | Admitting: Orthopedic Surgery

## 2015-04-24 VITALS — BP 118/88 | Ht 73.0 in | Wt 207.0 lb

## 2015-04-24 DIAGNOSIS — M75102 Unspecified rotator cuff tear or rupture of left shoulder, not specified as traumatic: Secondary | ICD-10-CM | POA: Diagnosis not present

## 2015-04-24 MED ORDER — HYDROCODONE-ACETAMINOPHEN 5-325 MG PO TABS: 1.0000 | ORAL_TABLET | Freq: Four times a day (QID) | ORAL | Status: DC | PRN

## 2015-04-24 NOTE — Progress Notes (Signed)
Chief Complaint  Patient presents with  . Follow-up    follow up left shoulder    65 year old male painful left shoulder treated with subacromial injection and hydrocodone for pain also treated with home exercises. He initially got good results with pain relief but now comes back in for recurrent pain left shoulder. No new trauma.  Review of Systems  Constitutional: Negative for fever and chills.  Neurological: Negative for tingling.     Physical Exam  Constitutional: He is oriented to person, place, and time and well-developed, well-nourished, and in no distress. No distress.  Cardiovascular: Intact distal pulses.   Musculoskeletal:       Right shoulder: He exhibits normal range of motion, no tenderness, no swelling and normal strength.       Left shoulder: He exhibits decreased range of motion, tenderness and crepitus. He exhibits no deformity and normal strength.       Arms: Neurological: He is alert and oriented to person, place, and time.  Skin: Skin is warm and dry. He is not diaphoretic.  Psychiatric: Affect and judgment normal.   Repeat injection left shoulder subacromial space  Procedure note the subacromial injection shoulder left   Verbal consent was obtained to inject the  Left   Shoulder  Timeout was completed to confirm the injection site is a subacromial space of the  left  shoulder  Medication used Depo-Medrol 40 mg and lidocaine 1% 3 cc  Anesthesia was provided by ethyl chloride  The injection was performed in the left  posterior subacromial space. After pinning the skin with alcohol and anesthetized the skin with ethyl chloride the subacromial space was injected using a 20-gauge needle. There were no complications  Sterile dressing was applied.  Diagnosis rotator cuff syndrome left shoulder  Patient advised to continue home exercises follow-up as needed  Meds ordered this encounter  Medications  . HYDROcodone-acetaminophen (NORCO/VICODIN) 5-325 MG  tablet    Sig: Take 1 tablet by mouth every 6 (six) hours as needed for moderate pain.    Dispense:  30 tablet    Refill:  0

## 2015-04-24 NOTE — Patient Instructions (Signed)

## 2015-05-02 ENCOUNTER — Telehealth: Payer: Self-pay | Admitting: Orthopedic Surgery

## 2015-05-03 MED FILL — HARVONI 90-400 MG TABLET: 90-400 | 28 days supply | Qty: 28 | Fill #2

## 2015-05-04 ENCOUNTER — Telehealth: Payer: Self-pay | Admitting: Orthopedic Surgery

## 2015-05-04 NOTE — Telephone Encounter (Signed)
Patient is requesting refill of Hydrocodone 5/325mg s. 1 Tablet by mouth every 6 hours as needed for moderate pain.   Was given 30 tablets on 04/24/15, has no scheduled return appointment.

## 2015-05-04 NOTE — Telephone Encounter (Signed)
Routing to DR. Aline Brochure

## 2015-05-05 NOTE — Telephone Encounter (Signed)
NO

## 2015-05-09 ENCOUNTER — Other Ambulatory Visit: Payer: Self-pay | Admitting: *Deleted

## 2015-05-09 NOTE — Telephone Encounter (Signed)
As noted per Dr Aline Brochure, No Refill.  Patient has been made aware.

## 2015-05-10 NOTE — Telephone Encounter (Signed)
Patient informed. 

## 2015-05-25 ENCOUNTER — Other Ambulatory Visit: Payer: Self-pay | Admitting: Physical Medicine & Rehabilitation

## 2015-06-01 ENCOUNTER — Emergency Department (HOSPITAL_COMMUNITY): Payer: Medicaid Other

## 2015-06-01 ENCOUNTER — Encounter (HOSPITAL_COMMUNITY): Payer: Self-pay

## 2015-06-01 ENCOUNTER — Emergency Department (HOSPITAL_COMMUNITY)
Admission: EM | Admit: 2015-06-01 | Discharge: 2015-06-01 | Disposition: A | Discharge status: Home / Self Care | Payer: Medicaid Other | Attending: Emergency Medicine | Admitting: Emergency Medicine

## 2015-06-01 DIAGNOSIS — J45909 Unspecified asthma, uncomplicated: Secondary | ICD-10-CM | POA: Insufficient documentation

## 2015-06-01 DIAGNOSIS — F17210 Nicotine dependence, cigarettes, uncomplicated: Secondary | ICD-10-CM | POA: Insufficient documentation

## 2015-06-01 DIAGNOSIS — G629 Polyneuropathy, unspecified: Secondary | ICD-10-CM

## 2015-06-01 DIAGNOSIS — Z7984 Long term (current) use of oral hypoglycemic drugs: Secondary | ICD-10-CM | POA: Insufficient documentation

## 2015-06-01 DIAGNOSIS — I10 Essential (primary) hypertension: Secondary | ICD-10-CM | POA: Diagnosis not present

## 2015-06-01 DIAGNOSIS — E119 Type 2 diabetes mellitus without complications: Secondary | ICD-10-CM | POA: Diagnosis not present

## 2015-06-01 DIAGNOSIS — Z8673 Personal history of transient ischemic attack (TIA), and cerebral infarction without residual deficits: Secondary | ICD-10-CM | POA: Diagnosis not present

## 2015-06-01 DIAGNOSIS — E785 Hyperlipidemia, unspecified: Secondary | ICD-10-CM | POA: Insufficient documentation

## 2015-06-01 DIAGNOSIS — M79672 Pain in left foot: Secondary | ICD-10-CM | POA: Insufficient documentation

## 2015-06-01 DIAGNOSIS — Z79899 Other long term (current) drug therapy: Secondary | ICD-10-CM | POA: Insufficient documentation

## 2015-06-01 LAB — CBG MONITORING, ED: GLUCOSE-CAPILLARY: 125 mg/dL — AB (ref 65–99)

## 2015-06-01 MED ORDER — HYDROCODONE-ACETAMINOPHEN 5-325 MG PO TABS: 2.0000 | ORAL_TABLET | ORAL | Status: DC | PRN

## 2015-06-01 MED ORDER — HYDROCODONE-ACETAMINOPHEN 5-325 MG PO TABS
2.0000 | ORAL_TABLET | Freq: Once | ORAL | Status: AC
  Administered 2015-06-01: 2 via ORAL
  Filled 2015-06-01: qty 2

## 2015-06-01 NOTE — ED Notes (Signed)
C/o left foot pain after dropping a can of food onto his foot.

## 2015-06-01 NOTE — ED Notes (Signed)
cbg checked 125.

## 2015-06-01 NOTE — Discharge Instructions (Signed)

## 2015-06-02 NOTE — ED Provider Notes (Signed)
CSN: [id number]     Arrival date & time 06/01/15  46 History   First MD Initiated Contact with Patient 06/01/15 1143     Chief Complaint  Patient presents with  . Foot Pain     (Consider location/radiation/quality/duration/timing/severity/associated sxs/prior Treatment) Patient is a 65 y.o. male presenting with lower extremity pain. The history is provided by the patient. No language interpreter was used.  Foot Pain This is a recurrent problem. The current episode started more than 1 month ago. The problem occurs intermittently. The problem has been unchanged. Associated symptoms include myalgias. Nothing aggravates the symptoms. He has tried nothing for the symptoms. The treatment provided moderate relief.  Pt has a history of neuropathy.   Pt reports he dropped a can of food on his foot a month ago and his foot has been numb since.  Pt reports neuropathy medication is not helping,  Aches worse today  Past Medical History  Diagnosis Date  . Diabetes mellitus   . Hypertension   . Hepatitis C     states he was diagnosed in 2007 or 2007 while living in [place], [place]  . CVA (cerebral infarction) 01/2011  . Hyperlipidemia   . Asthma   . PVD (peripheral vascular disease) ([place])   . Anemia   . Stroke ([place])   . [id number])    Past Surgical History  Procedure Laterality Date  . Liver biopsy  2005    Done in [place], [place]. Chronic hepatitis with mild periportal inflammation, lobular unicellular necrosis and portal fibrosis. Grade 2, stage 1-2.  Marland Kitchen Colonoscopy with propofol N/A 09/03/2012    [id number] polyp-removed as outlined above. Prominent internal hemorrhoids. Tubular adenoma  . Esophagogastroduodenoscopy (egd) with propofol N/A 09/03/2012    [id number] hernia. Gastric diverticulum. Gastric ulcers with associated erosions. Duodenal erosions. Status post gastric biopsy. H.PYLORI gastritis   . Biopsy N/A 09/03/2012    Procedure: BIOPSY;  Surgeon: Daneil Dolin, MD;  Location: [facility];  Service: Endoscopy;  Laterality: N/A;  gastric and gastric mucosa  . Polypectomy N/A 09/03/2012    Procedure: POLYPECTOMY;  Surgeon: Daneil Dolin, MD;  Location: [facility];  Service: Endoscopy;  Laterality: N/A;  cecal polyp  . Esophagogastroduodenoscopy (egd) with propofol N/A 12/03/2012    Dr. Gala Romney: gastric diverticulum, gastric erosions and scar. Previously noted gastric ulcer completed healed. Biopsy without H.pylori.   . Biopsy N/A 12/03/2012    Procedure: BIOPSY;  Surgeon: Daneil Dolin, MD;  Location: [facility];  Service: Endoscopy;  Laterality: N/A;  . Maximum access (mas)posterior lumbar interbody fusion (plif) 1 level N/A 11/25/2013    Procedure: FOR MAXIMUM ACCESS (MAS) POSTERIOR LUMBAR INTERBODY FUSION (PLIF) 1 LEVEL;  Surgeon: Eustace Moore, MD;  Location: [facility];  Service: Neurosurgery;  Laterality: N/A;  FOR MAXIMUM ACCESS (MAS) POSTERIOR LUMBAR INTERBODY FUSION (PLIF) 1 LEVEL LUMBAR 3-4   Family History  Problem Relation Age of Onset  . Breast cancer Mother     deceased  . Cancer Mother   . Diabetes Father   . Hypertension Father   . Heart disease Father     deceased  . Hyperlipidemia Father   . Diabetes Sister   . Hypertension Sister   . Breast cancer Sister   . Colon cancer Neg Hx   . Liver disease Neg Hx    Social History  Substance Use Topics  . Smoking status: Current Every Day Smoker -- 0.50 packs/day for 20 years    Types: Cigarettes  .  Smokeless tobacco: Never Used     Comment: given 1-800-Quit Now  . Alcohol Use: No    Review of Systems  Musculoskeletal: Positive for myalgias.  All other systems reviewed and are negative.     Allergies  Review of patient's allergies indicates no known allergies.  Home Medications   Prior to Admission medications   Medication Sig Start Date End Date Taking? Authorizing Provider  gabapentin (NEURONTIN) 300 MG capsule Take 1 capsule (300 mg total) by mouth 2 (two) times daily. 12/03/13  Yes Daniel J  Angiulli, PA-C  glipiZIDE (GLUCOTROL) 5 MG tablet Take 2.5 mg by mouth 2 (two) times daily before a meal.   Yes Historical Provider, MD  Ledipasvir-Sofosbuvir (HARVONI) 90-400 MG TABS Take 1 tablet by mouth daily. 02/22/15  Yes Thayer Headings, MD  lisinopril-hydrochlorothiazide (PRINZIDE,ZESTORETIC) 20-12.5 MG per tablet Take 1 tablet by mouth 2 (two) times daily. 12/03/13  Yes Daniel J Angiulli, PA-C  metFORMIN (GLUCOPHAGE) 1000 MG tablet Take 1 tablet (1,000 mg total) by mouth 2 (two) times daily with a meal. 12/03/13  Yes Daniel J Angiulli, PA-C  simvastatin (ZOCOR) 20 MG tablet TAKE ONE TABLET BY MOUTH DAILY AT 6 PM. 02/14/15  Yes Herminio Commons, MD  HYDROcodone-acetaminophen (NORCO/VICODIN) 5-325 MG tablet Take 2 tablets by mouth every 4 (four) hours as needed. 06/01/15   Sorrel, PA-C   BP 138/89 mmHg  Pulse 104  Temp(Src) 98.2 F (36.8 C) (Oral)  Resp 16  Ht 6\' 1"  (1.854 m)  Wt 92.987 kg  BMI 27.05 kg/m2  SpO2 99% Physical Exam  Constitutional: He appears well-developed and well-nourished.  HENT:  Head: Normocephalic and atraumatic.  Cardiovascular: Normal rate.   Pulmonary/Chest: Effort normal.  Musculoskeletal: Normal range of motion. He exhibits tenderness.  Scar midfoot from can,  Well healed.  Neurological: He is alert. He has normal reflexes.  Skin: Skin is warm.  Nursing note and vitals reviewed.   ED Course  Procedures (including critical care time) Labs Review Labs Reviewed  CBG MONITORING, ED - Abnormal; Notable for the following:    Glucose-Capillary 125 (*)    All other components within normal limits    Imaging Review Dg Foot Complete Left  06/01/2015  CLINICAL DATA:  Injured left foot.  Dropped a can on foot today. EXAM: LEFT FOOT - COMPLETE 3+ VIEW COMPARISON:  None. FINDINGS: The joint spaces are maintained. No acute fracture. Small vessel calcifications are noted. IMPRESSION: No acute bony findings. Electronically Signed   By: Marijo Sanes  M.D.   On: 06/01/2015 11:41   I have personally reviewed and evaluated these images and lab results as part of my medical decision-making.   EKG Interpretation None      MDM  I think pt's pain is due to neuropathy.  He has limited understanding of his problem and does not understand that this pain is most likely not injury.  I advised pt ED does not manage pain medication and that he needs to discuss pain from his neuropathy with Dr. Berdine Addison.  I will give him 10 hydrocodone to help until he can spaek to his doctor.     Final diagnoses:  Foot pain, left    Meds ordered this encounter  Medications  . HYDROcodone-acetaminophen (NORCO/VICODIN) 5-325 MG tablet    Sig: Take 2 tablets by mouth every 4 (four) hours as needed.    Dispense:  10 tablet    Refill:  0    Order Specific Question:  Supervising Provider    Answer:  Noemi Chapel [[id number]]  . HYDROcodone-acetaminophen (NORCO/VICODIN) 5-325 MG per tablet 2 tablet    Sig:       Fransico Meadow, PA-C 06/02/15 [id number]  Milton Ferguson, MD 06/02/15 1005

## 2015-06-06 ENCOUNTER — Encounter (HOSPITAL_COMMUNITY): Payer: Self-pay | Admitting: Emergency Medicine

## 2015-06-06 ENCOUNTER — Emergency Department (HOSPITAL_COMMUNITY)
Admission: EM | Admit: 2015-06-06 | Discharge: 2015-06-06 | Disposition: A | Discharge status: Home / Self Care | Payer: Medicaid Other | Attending: Emergency Medicine | Admitting: Emergency Medicine

## 2015-06-06 DIAGNOSIS — E1165 Type 2 diabetes mellitus with hyperglycemia: Secondary | ICD-10-CM | POA: Insufficient documentation

## 2015-06-06 DIAGNOSIS — M79672 Pain in left foot: Secondary | ICD-10-CM | POA: Diagnosis present

## 2015-06-06 DIAGNOSIS — R739 Hyperglycemia, unspecified: Secondary | ICD-10-CM

## 2015-06-06 DIAGNOSIS — Z7984 Long term (current) use of oral hypoglycemic drugs: Secondary | ICD-10-CM | POA: Diagnosis not present

## 2015-06-06 DIAGNOSIS — F17210 Nicotine dependence, cigarettes, uncomplicated: Secondary | ICD-10-CM | POA: Diagnosis not present

## 2015-06-06 DIAGNOSIS — E785 Hyperlipidemia, unspecified: Secondary | ICD-10-CM | POA: Insufficient documentation

## 2015-06-06 DIAGNOSIS — J45909 Unspecified asthma, uncomplicated: Secondary | ICD-10-CM | POA: Insufficient documentation

## 2015-06-06 DIAGNOSIS — Z79899 Other long term (current) drug therapy: Secondary | ICD-10-CM | POA: Diagnosis not present

## 2015-06-06 DIAGNOSIS — Z8673 Personal history of transient ischemic attack (TIA), and cerebral infarction without residual deficits: Secondary | ICD-10-CM | POA: Insufficient documentation

## 2015-06-06 DIAGNOSIS — E1151 Type 2 diabetes mellitus with diabetic peripheral angiopathy without gangrene: Secondary | ICD-10-CM | POA: Insufficient documentation

## 2015-06-06 DIAGNOSIS — I10 Essential (primary) hypertension: Secondary | ICD-10-CM | POA: Insufficient documentation

## 2015-06-06 LAB — CBG MONITORING, ED: GLUCOSE-CAPILLARY: 189 mg/dL — AB (ref 65–99)

## 2015-06-06 MED ORDER — HYDROCODONE-ACETAMINOPHEN 5-325 MG PO TABS: 1.0000 | ORAL_TABLET | Freq: Four times a day (QID) | ORAL | Status: DC | PRN

## 2015-06-06 MED ORDER — HYDROCODONE-ACETAMINOPHEN 5-325 MG PO TABS
1.0000 | ORAL_TABLET | Freq: Once | ORAL | Status: AC
  Administered 2015-06-06: 1 via ORAL
  Filled 2015-06-06: qty 1

## 2015-06-06 NOTE — ED Notes (Signed)
PT stated he dropped a frozen chicken onto his left foot on 05/29/15 and was seen in ED with xrays on 06/01/15 and states pain continues to left foot. PT ambulatory in triage with cane.

## 2015-06-06 NOTE — Discharge Instructions (Signed)
Musculoskeletal Pain Musculoskeletal pain is muscle and boney aches and pains. These pains can occur in any part of the body. Your caregiver may treat you without knowing the cause of the pain. They may treat you if blood or urine tests, X-rays, and other tests were normal.  CAUSES There is often not a definite cause or reason for these pains. These pains may be caused by a type of germ (virus). The discomfort may also come from overuse. Overuse includes working out too hard when your body is not fit. Boney aches also come from weather changes. Bone is sensitive to atmospheric pressure changes. HOME CARE INSTRUCTIONS   Ask when your test results will be ready. Make sure you get your test results.  Only take over-the-counter or prescription medicines for pain, discomfort, or fever as directed by your caregiver. If you were given medications for your condition, do not drive, operate machinery or power tools, or sign legal documents for 24 hours. Do not drink alcohol. Do not take sleeping pills or other medications that may interfere with treatment.  Continue all activities unless the activities cause more pain. When the pain lessens, slowly resume normal activities. Gradually increase the intensity and duration of the activities or exercise.  During periods of severe pain, bed rest may be helpful. Lay or sit in any position that is comfortable.  Putting ice on the injured area.  Put ice in a bag.  Place a towel between your skin and the bag.  Leave the ice on for 15 to 20 minutes, 3 to 4 times a day.  Follow up with your caregiver for continued problems and no reason can be found for the pain. If the pain becomes worse or does not go away, it may be necessary to repeat tests or do additional testing. Your caregiver may need to look further for a possible cause. SEEK IMMEDIATE MEDICAL CARE IF:  You have pain that is getting worse and is not relieved by medications.  You develop chest pain  that is associated with shortness or breath, sweating, feeling sick to your stomach (nauseous), or throw up (vomit).  Your pain becomes localized to the abdomen.  You develop any new symptoms that seem different or that concern you. MAKE SURE YOU:   Understand these instructions.  Will watch your condition.  Will get help right away if you are not doing well or get worse.   This information is not intended to replace advice given to you by your health care provider. Make sure you discuss any questions you have with your health care provider.   Document Released: 01/28/2005 Document Revised: 04/22/2011 Document Reviewed: 10/02/2012 Elsevier Interactive Patient Education 2016 [place] may take the hydrocodone prescribed for pain relief.  This will make you drowsy - do not drive within 4 hours of taking this medication.  I have printed information about diabetic neuropathy which as discussed may be the source of your pain.  See your doctor on Monday as planned.

## 2015-06-07 NOTE — ED Provider Notes (Signed)
CSN: [id number]     Arrival date & time 06/06/15  1207 History   First MD Initiated Contact with Patient 06/06/15 1224     Chief Complaint  Patient presents with  . Foot Injury     (Consider location/radiation/quality/duration/timing/severity/associated sxs/prior Treatment) The history is provided by the patient.   Cory Weber is a 65 y.o. male with a history significant for DM, htn, hepatitis C, CVA and peripheral vascular disease presenting with persistent pain in his left foot.  He reports dropping a frozen chicken on his foot 8 days ago  And prior to this dropped a can of food on the same foot causing persistent pain.  He is able to weight bear but with pain that radiates into his lower leg, does not progress to the calf.  He was seen her on the 20th at which time his xrays were negative.  He states his blood sugars have been under good control, but also does not routinely check them.  He denies exertional worsened pain.  He does have a history of neuropathy but states his gabapentin is not helping.  He is scheduled to see his doctor for this on Monday.    Past Medical History  Diagnosis Date  . Diabetes mellitus   . Hypertension   . Hepatitis C     states he was diagnosed in 2007 or 2007 while living in [place], [place]  . CVA (cerebral infarction) 01/2011  . Hyperlipidemia   . Asthma   . PVD (peripheral vascular disease) ([place])   . Anemia   . Stroke ([place])   . [id number])    Past Surgical History  Procedure Laterality Date  . Liver biopsy  2005    Done in [place], [place]. Chronic hepatitis with mild periportal inflammation, lobular unicellular necrosis and portal fibrosis. Grade 2, stage 1-2.  Marland Kitchen Colonoscopy with propofol N/A 09/03/2012    [id number] polyp-removed as outlined above. Prominent internal hemorrhoids. Tubular adenoma  . Esophagogastroduodenoscopy (egd) with propofol N/A 09/03/2012    [id number] hernia. Gastric diverticulum. Gastric ulcers with associated  erosions. Duodenal erosions. Status post gastric biopsy. H.PYLORI gastritis   . Biopsy N/A 09/03/2012    Procedure: BIOPSY;  Surgeon: Daneil Dolin, MD;  Location: [facility];  Service: Endoscopy;  Laterality: N/A;  gastric and gastric mucosa  . Polypectomy N/A 09/03/2012    Procedure: POLYPECTOMY;  Surgeon: Daneil Dolin, MD;  Location: [facility];  Service: Endoscopy;  Laterality: N/A;  cecal polyp  . Esophagogastroduodenoscopy (egd) with propofol N/A 12/03/2012    Dr. Gala Romney: gastric diverticulum, gastric erosions and scar. Previously noted gastric ulcer completed healed. Biopsy without H.pylori.   . Biopsy N/A 12/03/2012    Procedure: BIOPSY;  Surgeon: Daneil Dolin, MD;  Location: [facility];  Service: Endoscopy;  Laterality: N/A;  . Maximum access (mas)posterior lumbar interbody fusion (plif) 1 level N/A 11/25/2013    Procedure: FOR MAXIMUM ACCESS (MAS) POSTERIOR LUMBAR INTERBODY FUSION (PLIF) 1 LEVEL;  Surgeon: Eustace Moore, MD;  Location: [facility];  Service: Neurosurgery;  Laterality: N/A;  FOR MAXIMUM ACCESS (MAS) POSTERIOR LUMBAR INTERBODY FUSION (PLIF) 1 LEVEL LUMBAR 3-4   Family History  Problem Relation Age of Onset  . Breast cancer Mother     deceased  . Cancer Mother   . Diabetes Father   . Hypertension Father   . Heart disease Father     deceased  . Hyperlipidemia Father   . Diabetes Sister   . Hypertension Sister   .  Breast cancer Sister   . Colon cancer Neg Hx   . Liver disease Neg Hx    Social History  Substance Use Topics  . Smoking status: Current Every Day Smoker -- 0.50 packs/day for 20 years    Types: Cigarettes  . Smokeless tobacco: Never Used     Comment: given 1-800-Quit Now  . Alcohol Use: No    Review of Systems  Constitutional: Negative for fever.  Musculoskeletal: Positive for arthralgias. Negative for myalgias and joint swelling.  Skin: Negative for color change and wound.  Neurological: Negative for weakness and numbness.      Allergies  Review  of patient's allergies indicates no known allergies.  Home Medications   Prior to Admission medications   Medication Sig Start Date End Date Taking? Authorizing Provider  gabapentin (NEURONTIN) 300 MG capsule Take 1 capsule (300 mg total) by mouth 2 (two) times daily. 12/03/13  Yes Daniel J Angiulli, PA-C  glipiZIDE (GLUCOTROL) 5 MG tablet Take 2.5 mg by mouth 2 (two) times daily before a meal.   Yes Historical Provider, MD  Ledipasvir-Sofosbuvir (HARVONI) 90-400 MG TABS Take 1 tablet by mouth daily. 02/22/15  Yes Thayer Headings, MD  lisinopril-hydrochlorothiazide (PRINZIDE,ZESTORETIC) 20-12.5 MG per tablet Take 1 tablet by mouth 2 (two) times daily. 12/03/13  Yes Daniel J Angiulli, PA-C  metFORMIN (GLUCOPHAGE) 1000 MG tablet Take 1 tablet (1,000 mg total) by mouth 2 (two) times daily with a meal. 12/03/13  Yes Daniel J Angiulli, PA-C  simvastatin (ZOCOR) 20 MG tablet TAKE ONE TABLET BY MOUTH DAILY AT 6 PM. 02/14/15  Yes Herminio Commons, MD  HYDROcodone-acetaminophen (NORCO/VICODIN) 5-325 MG tablet Take 1 tablet by mouth every 6 (six) hours as needed. 06/06/15   Evalee Jefferson, PA-C   BP 156/80 mmHg  Pulse 82  Temp(Src) 98.3 F (36.8 C) (Oral)  Resp 18  Ht 6\' 1"  (1.854 m)  Wt 92.987 kg  BMI 27.05 kg/m2  SpO2 100% Physical Exam  Constitutional: He appears well-developed and well-nourished.  HENT:  Head: Atraumatic.  Neck: Normal range of motion.  Cardiovascular:  Pulses equal bilaterally  Musculoskeletal: He exhibits tenderness.       Left foot: There is bony tenderness. There is no swelling, no crepitus and no deformity.  ttp left dorsal foot. No deformity, no swelling.  No wounds.  The foot distally is cooler to touch than the right. Unable to palpate pulses, right also difficult to locate. Less than 3 sec cap refill bilaterally.  Neurological: He is alert. He has normal strength. He displays normal reflexes. No sensory deficit.  Skin: Skin is warm and dry.  Psychiatric: He has a  normal mood and affect.    ED Course  Procedures (including critical care time)  Bedside doppler utilized to find bilateral pulses which are present, both dorsalis pedis and posterior tibial.      Labs Review Labs Reviewed  CBG MONITORING, ED - Abnormal; Notable for the following:    Glucose-Capillary 189 (*)    All other components within normal limits    Imaging Review No results found. I have personally reviewed and evaluated these images and lab results as part of my medical decision-making.   EKG Interpretation None      MDM   Final diagnoses:  Foot pain, left  Hyperglycemia    Pt prescribed hydrocodone for pain. Advised f/u with pcp as planned on Monday. Imaging from last visit reviewed.  No indication for repeat imaging.  Pt pain probably from  neuropathy.  He does have reduced but present arterial circulation in bilateral feet, left more diminished than right.  Advised he may need to be evaluated further for vascular source of pain as well.  Pt agrees and will f/u with pcp.  Discussed strict return precautions.     Evalee Jefferson, PA-C 06/07/15 0818  Elnora Morrison, MD 06/10/15 [phone]

## 2015-06-08 ENCOUNTER — Encounter: Payer: Self-pay | Admitting: Internal Medicine

## 2015-06-08 ENCOUNTER — Ambulatory Visit (INDEPENDENT_AMBULATORY_CARE_PROVIDER_SITE_OTHER): Payer: Medicaid Other | Admitting: Internal Medicine

## 2015-06-08 ENCOUNTER — Telehealth: Payer: Self-pay | Admitting: Gastroenterology

## 2015-06-08 VITALS — BP 128/87 | HR 97 | Temp 98.5°F | Wt 204.0 lb

## 2015-06-08 DIAGNOSIS — K7460 Unspecified cirrhosis of liver: Secondary | ICD-10-CM | POA: Diagnosis not present

## 2015-06-08 DIAGNOSIS — B182 Chronic viral hepatitis C: Secondary | ICD-10-CM

## 2015-06-08 NOTE — Telephone Encounter (Signed)
OK to schedule pt with Dr. Loletha Carrow, see note below.

## 2015-06-08 NOTE — Progress Notes (Signed)
   Subjective:    Patient ID: Cory Weber, male    DOB: 1950/10/27, 64 y.o.   MRN: [id number]  HPI Here for follow up of HCV.  Has genotype 1b, elastographyy F3/4.  Now has completed Harvoni and now issues during treatment.  Did hurt his foot recently after dropping a frozen [place] on it.  Is hepatitis A and B immune, had pneumovax in 2015.  Discussed results of elastography.  No alcohol.    Review of Systems  Constitutional: Negative for fatigue.  Gastrointestinal: Negative for diarrhea.  Skin: Negative for rash.  Neurological: Negative for dizziness and headaches.       Objective:   Physical Exam  Constitutional: He appears well-developed and well-nourished. No distress.  Eyes: No scleral icterus.  Cardiovascular: Normal rate, regular rhythm and normal heart sounds.   No murmur heard. Pulmonary/Chest: Breath sounds normal. No respiratory distress.  Skin: No rash noted.          Assessment & Plan:

## 2015-06-08 NOTE — Telephone Encounter (Signed)
Dr.Danis will you accept this pt? See note below.

## 2015-06-08 NOTE — Assessment & Plan Note (Signed)
End of treatmen tlab  Today and rtc 3-4 months for SVR12.

## 2015-06-08 NOTE — Telephone Encounter (Signed)
sure

## 2015-06-08 NOTE — Assessment & Plan Note (Signed)
I will refer to GI for ? EGD/varices screening.  I will do [place] screening every 6 months, next due at his next visit.   Had pneumovax.

## 2015-06-08 NOTE — Telephone Encounter (Signed)
4/27  Referral for patient to be seen for cirrhosis.  Patient has history with [facility], but patient wishes to have a GI in [place].  Will Dr. Loletha Carrow accept transfer of care?

## 2015-06-09 LAB — HEPATITIS C RNA QUANTITATIVE: HCV QUANT: NOT DETECTED [IU]/mL (ref <15)

## 2015-06-09 LAB — AFP TUMOR MARKER: AFP-Tumor Marker: 2.2 ng/mL (ref <6.1)

## 2015-06-14 ENCOUNTER — Ambulatory Visit (INDEPENDENT_AMBULATORY_CARE_PROVIDER_SITE_OTHER): Payer: Medicaid Other | Admitting: Orthopedic Surgery

## 2015-06-14 ENCOUNTER — Encounter: Payer: Self-pay | Admitting: Orthopedic Surgery

## 2015-06-14 VITALS — BP 118/92 | Ht 73.0 in | Wt 204.0 lb

## 2015-06-14 DIAGNOSIS — S9032XA Contusion of left foot, initial encounter: Secondary | ICD-10-CM | POA: Diagnosis not present

## 2015-06-14 MED ORDER — HYDROCODONE-ACETAMINOPHEN 5-325 MG PO TABS: 1.0000 | ORAL_TABLET | Freq: Four times a day (QID) | ORAL | Status: DC | PRN

## 2015-06-14 NOTE — Progress Notes (Signed)
Chief Complaint  Patient presents with  . Foot Weber    er follow up left foot Weber, injury 06/01/15   HPI 65 years old dropped take chicken on his foot then dropped a can on his foot a week later so 2 weeks ago his initial injury  Complains of severe Weber over the dorsal aspect of his left foot associated with burning tingling and numbness and constant Weber can't sleep at night  ROS   He does not complain of fever chills weight loss but does complain of some burning and numbness and tingling in the foot Denies any aching in the back   Past Medical History  Diagnosis Date  . Diabetes mellitus   . Hypertension   . Hepatitis C     states he was diagnosed in 2007 or 2007 while living in [place], [place]  . CVA (cerebral infarction) 01/2011  . Hyperlipidemia   . Asthma   . PVD (peripheral vascular disease) ([place])   . Anemia   . Stroke ([place])   . [id number])     Past Surgical History  Procedure Laterality Date  . Liver biopsy  2005    Done in [place], [place]. Chronic hepatitis with mild periportal inflammation, lobular unicellular necrosis and portal fibrosis. Grade 2, stage 1-2.  Marland Kitchen Colonoscopy with propofol N/A 09/03/2012    [id number] polyp-removed as outlined above. Prominent internal hemorrhoids. Tubular adenoma  . Esophagogastroduodenoscopy (egd) with propofol N/A 09/03/2012    [id number] hernia. Gastric diverticulum. Gastric ulcers with associated erosions. Duodenal erosions. Status post gastric biopsy. H.PYLORI gastritis   . Biopsy N/A 09/03/2012    Procedure: BIOPSY;  Surgeon: Daneil Dolin, MD;  Location: [facility];  Service: Endoscopy;  Laterality: N/A;  gastric and gastric mucosa  . Polypectomy N/A 09/03/2012    Procedure: POLYPECTOMY;  Surgeon: Daneil Dolin, MD;  Location: [facility];  Service: Endoscopy;  Laterality: N/A;  cecal polyp  . Esophagogastroduodenoscopy (egd) with propofol N/A 12/03/2012    Dr. Gala Romney: gastric diverticulum, gastric erosions and scar. Previously  noted gastric ulcer completed healed. Biopsy without H.pylori.   . Biopsy N/A 12/03/2012    Procedure: BIOPSY;  Surgeon: Daneil Dolin, MD;  Location: [facility];  Service: Endoscopy;  Laterality: N/A;  . Maximum access (mas)posterior lumbar interbody fusion (plif) 1 level N/A 11/25/2013    Procedure: FOR MAXIMUM ACCESS (MAS) POSTERIOR LUMBAR INTERBODY FUSION (PLIF) 1 LEVEL;  Surgeon: Eustace Moore, MD;  Location: [facility];  Service: Neurosurgery;  Laterality: N/A;  FOR MAXIMUM ACCESS (MAS) POSTERIOR LUMBAR INTERBODY FUSION (PLIF) 1 LEVEL LUMBAR 3-4   Family History  Problem Relation Age of Onset  . Breast cancer Mother     deceased  . Cancer Mother   . Diabetes Father   . Hypertension Father   . Heart disease Father     deceased  . Hyperlipidemia Father   . Diabetes Sister   . Hypertension Sister   . Breast cancer Sister   . Colon cancer Neg Hx   . Liver disease Neg Hx    Social History  Substance Use Topics  . Smoking status: Current Every Day Smoker -- 0.50 packs/day for 20 years    Types: Cigarettes  . Smokeless tobacco: Never Used     Comment: given 1-800-Quit Now  . Alcohol Use: No    Current outpatient prescriptions:  .  gabapentin (NEURONTIN) 300 MG capsule, Take 1 capsule (300 mg total) by mouth 2 (two) times daily., Disp: 60 capsule, Rfl:  1 .  glipiZIDE (GLUCOTROL) 5 MG tablet, Take 2.5 mg by mouth 2 (two) times daily before a meal., Disp: , Rfl:  .  HYDROcodone-acetaminophen (NORCO/VICODIN) 5-325 MG tablet, Take 1 tablet by mouth every 6 (six) hours as needed., Disp: 20 tablet, Rfl: 0 .  lisinopril-hydrochlorothiazide (PRINZIDE,ZESTORETIC) 20-12.5 MG per tablet, Take 1 tablet by mouth 2 (two) times daily., Disp: 30 tablet, Rfl: 1 .  metFORMIN (GLUCOPHAGE) 1000 MG tablet, Take 1 tablet (1,000 mg total) by mouth 2 (two) times daily with a meal., Disp: 60 tablet, Rfl: 1 .  simvastatin (ZOCOR) 20 MG tablet, TAKE ONE TABLET BY MOUTH DAILY AT 6 PM., Disp: 30 tablet, Rfl:  0 .  [DISCONTINUED] ferrous fumarate (HEMOCYTE - 106 MG FE) 325 (106 FE) MG TABS tablet, Take 1 tablet (106 mg of iron total) by mouth 2 (two) times daily. (Patient not taking: Reported on 04/01/2014), Disp: 60 each, Rfl: 0  BP 118/92 mmHg  Ht 6\' 1"  (1.854 m)  Wt 204 lb (92.534 kg)  BMI 26.92 kg/m2  Physical Exam  Constitutional: He is oriented to person, place, and time. He appears well-developed and well-nourished. No distress.  Cardiovascular: Normal rate and intact distal pulses.   Neurological: He is alert and oriented to person, place, and time.  Skin: Skin is warm and dry. No rash noted. He is not diaphoretic. No erythema. No pallor.  Psychiatric: He has a normal mood and affect. His behavior is normal. Judgment and thought content normal.    Ortho Exam  Left foot shows an abrasion that is healed there is tenderness there is reproduces some of the tingling has normal range of motion in the foot ankle is stable ankle range of motion is stable there is no atrophy skin as described pulses good sensation is normal there is no lymph node changes he is walking with a cane with a limp  Opposite right foot no atrophy and no swelling ASSESSMENT: My personal interpretation of the images:  X-rays of the foot show no fracture  PLAN Recommend Biofreeze, ibuprofen, hydrocodone. Follow-up as needed

## 2015-06-14 NOTE — Patient Instructions (Signed)
biofreeze  PT WHIRPOOL 6 TREATMENTS

## 2015-06-29 ENCOUNTER — Encounter (HOSPITAL_COMMUNITY): Payer: Self-pay | Admitting: Emergency Medicine

## 2015-06-29 ENCOUNTER — Ambulatory Visit (HOSPITAL_COMMUNITY): Payer: Medicaid Other | Attending: Orthopedic Surgery | Admitting: Physical Therapy

## 2015-06-29 ENCOUNTER — Emergency Department (HOSPITAL_COMMUNITY): Payer: Medicaid Other

## 2015-06-29 ENCOUNTER — Emergency Department (HOSPITAL_COMMUNITY)
Admission: EM | Admit: 2015-06-29 | Discharge: 2015-06-29 | Disposition: A | Discharge status: Home / Self Care | Payer: Medicaid Other | Attending: Emergency Medicine | Admitting: Emergency Medicine

## 2015-06-29 DIAGNOSIS — Z8673 Personal history of transient ischemic attack (TIA), and cerebral infarction without residual deficits: Secondary | ICD-10-CM | POA: Insufficient documentation

## 2015-06-29 DIAGNOSIS — Z7984 Long term (current) use of oral hypoglycemic drugs: Secondary | ICD-10-CM | POA: Diagnosis not present

## 2015-06-29 DIAGNOSIS — J45909 Unspecified asthma, uncomplicated: Secondary | ICD-10-CM | POA: Diagnosis not present

## 2015-06-29 DIAGNOSIS — F17210 Nicotine dependence, cigarettes, uncomplicated: Secondary | ICD-10-CM | POA: Insufficient documentation

## 2015-06-29 DIAGNOSIS — Z79899 Other long term (current) drug therapy: Secondary | ICD-10-CM | POA: Insufficient documentation

## 2015-06-29 DIAGNOSIS — G629 Polyneuropathy, unspecified: Secondary | ICD-10-CM

## 2015-06-29 DIAGNOSIS — E785 Hyperlipidemia, unspecified: Secondary | ICD-10-CM | POA: Diagnosis not present

## 2015-06-29 DIAGNOSIS — R600 Localized edema: Secondary | ICD-10-CM

## 2015-06-29 DIAGNOSIS — M79672 Pain in left foot: Secondary | ICD-10-CM | POA: Insufficient documentation

## 2015-06-29 DIAGNOSIS — E1140 Type 2 diabetes mellitus with diabetic neuropathy, unspecified: Secondary | ICD-10-CM | POA: Diagnosis not present

## 2015-06-29 DIAGNOSIS — I10 Essential (primary) hypertension: Secondary | ICD-10-CM | POA: Insufficient documentation

## 2015-06-29 DIAGNOSIS — I739 Peripheral vascular disease, unspecified: Secondary | ICD-10-CM | POA: Insufficient documentation

## 2015-06-29 HISTORY — DX: Polyneuropathy, unspecified: G62.9

## 2015-06-29 MED ORDER — OXYCODONE-ACETAMINOPHEN 5-325 MG PO TABS: ORAL_TABLET | ORAL | Status: DC

## 2015-06-29 MED ORDER — OXYCODONE-ACETAMINOPHEN 5-325 MG PO TABS
1.0000 | ORAL_TABLET | Freq: Once | ORAL | Status: AC
  Administered 2015-06-29: 1 via ORAL
  Filled 2015-06-29: qty 1

## 2015-06-29 NOTE — Therapy (Signed)
[facility] [place], [place], [address] Phone: [phone]   Fax:  [phone]  Patient Details  Name: Cory Weber MRN: [id number] Date of Birth: April 13, 1950 Referring Provider:  Carole Civil, MD  Encounter Date: 06/29/2015   Patient arrives with diagnosis of contusion to L foot that he sustained approximately 3-4 weeks ago; he reports that his foot has remained very painful and has been swelling. Upon examination, patient has no open wound and does not have any palpable collection of blood or hardened tissues under skin. Patient reveals significant localized edema in L foot/ankle in general, antalgic gait pattern, pain and weakness with resisted muscle testing, however only very mild increase in skin temperature L foot/ankle.   Patient not in need of skilled wound care services, recommended patient see MD for a second opinion on his L foot/ankle.   No charge for today's session.    Deniece Ree PT, DPT [id number]  [facility] [address] [place], [place], [address] Phone: [phone]   Fax:  [phone]

## 2015-06-29 NOTE — Discharge Instructions (Signed)
Take your prescriptions as directed.  Apply moist heat or ice to the area(s) of discomfort, for 15 minutes at a time, several times per day for the next few days.  Do not fall asleep on a heating or ice pack.  Call your regular medical doctor today to schedule a follow up appointment in the next 2 days. Call the Neurologist today to schedule a follow up appointment within the next week.  Return to the Emergency Department immediately if worsening.

## 2015-06-29 NOTE — ED Notes (Signed)
U/S machine used to view veins and arteries.  Swelling noted to left foot.  Faint pulse noted.

## 2015-06-29 NOTE — ED Provider Notes (Signed)
CSN: [id number]     Arrival date & time 06/29/15  1412 History   First MD Initiated Contact with Patient 06/29/15 1511     Chief Complaint  Patient presents with  . Ankle Pain      HPI Pt was seen at 1515. Per pt, c/o gradual onset and persistence of constant left dorsal foot "pain" for the past 1 month. Pt describes the pain as "burning" and "shooting" into his anterior left ankle and lower leg areas. Symptoms began after he dropped a heavy object on his foot. The symptoms have been associated with no other complaints. The patient has a significant history of similar symptoms previously, recently being evaluated for this complaint and multiple prior evals for same by the ED, his PMD and Ortho MD with negative XR. Pt requesting narcotic pain medication rx "until next week" when he is due to see his PMD again.       Past Medical History  Diagnosis Date  . Diabetes mellitus   . Hypertension   . Hepatitis C     states he was diagnosed in 2007 or 2007 while living in [place], [place]  . CVA (cerebral infarction) 01/2011  . Hyperlipidemia   . Asthma   . PVD (peripheral vascular disease) ([place])   . Anemia   . Stroke ([place])   . Headache(784.0)   . Peripheral neuropathy [facility])    Past Surgical History  Procedure Laterality Date  . Liver biopsy  2005    Done in [place], [place]. Chronic hepatitis with mild periportal inflammation, lobular unicellular necrosis and portal fibrosis. Grade 2, stage 1-2.  Marland Kitchen Colonoscopy with propofol N/A 09/03/2012    [id number] polyp-removed as outlined above. Prominent internal hemorrhoids. Tubular adenoma  . Esophagogastroduodenoscopy (egd) with propofol N/A 09/03/2012    [id number] hernia. Gastric diverticulum. Gastric ulcers with associated erosions. Duodenal erosions. Status post gastric biopsy. H.PYLORI gastritis   . Biopsy N/A 09/03/2012    Procedure: BIOPSY;  Surgeon: Daneil Dolin, MD;  Location: [facility];  Service: Endoscopy;  Laterality: N/A;  gastric and  gastric mucosa  . Polypectomy N/A 09/03/2012    Procedure: POLYPECTOMY;  Surgeon: Daneil Dolin, MD;  Location: [facility];  Service: Endoscopy;  Laterality: N/A;  cecal polyp  . Esophagogastroduodenoscopy (egd) with propofol N/A 12/03/2012    Dr. Gala Romney: gastric diverticulum, gastric erosions and scar. Previously noted gastric ulcer completed healed. Biopsy without H.pylori.   . Biopsy N/A 12/03/2012    Procedure: BIOPSY;  Surgeon: Daneil Dolin, MD;  Location: [facility];  Service: Endoscopy;  Laterality: N/A;  . Maximum access (mas)posterior lumbar interbody fusion (plif) 1 level N/A 11/25/2013    Procedure: FOR MAXIMUM ACCESS (MAS) POSTERIOR LUMBAR INTERBODY FUSION (PLIF) 1 LEVEL;  Surgeon: Eustace Moore, MD;  Location: [facility];  Service: Neurosurgery;  Laterality: N/A;  FOR MAXIMUM ACCESS (MAS) POSTERIOR LUMBAR INTERBODY FUSION (PLIF) 1 LEVEL LUMBAR 3-4   Family History  Problem Relation Age of Onset  . Breast cancer Mother     deceased  . Cancer Mother   . Diabetes Father   . Hypertension Father   . Heart disease Father     deceased  . Hyperlipidemia Father   . Diabetes Sister   . Hypertension Sister   . Breast cancer Sister   . Colon cancer Neg Hx   . Liver disease Neg Hx    Social History  Substance Use Topics  . Smoking status: Current Every Day Smoker -- 0.50 packs/day for  20 years    Types: Cigarettes  . Smokeless tobacco: Never Used     Comment: given 1-800-Quit Now  . Alcohol Use: No    Review of Systems ROS: Statement: All systems negative except as marked or noted in the HPI; Constitutional: Negative for fever and chills. ; ; Eyes: Negative for eye pain, redness and discharge. ; ; ENMT: Negative for ear pain, hoarseness, nasal congestion, sinus pressure and sore throat. ; ; Cardiovascular: Negative for chest pain, palpitations, diaphoresis, dyspnea and peripheral edema. ; ; Respiratory: Negative for cough, wheezing and stridor. ; ; Gastrointestinal: Negative for  nausea, vomiting, diarrhea, abdominal pain, blood in stool, hematemesis, jaundice and rectal bleeding. . ; ; Genitourinary: Negative for dysuria, flank pain and hematuria. ; ; Musculoskeletal: +left foot pain. Negative for back pain and neck pain. Negative for swelling and trauma.; ; Skin: Negative for pruritus, rash, abrasions, blisters, bruising and skin lesion.; ; Neuro: Negative for headache, lightheadedness and neck stiffness. Negative for weakness, altered level of consciousness, altered mental status, extremity weakness, paresthesias, involuntary movement, seizure and syncope.      Allergies  Review of patient's allergies indicates no known allergies.  Home Medications   Prior to Admission medications   Medication Sig Start Date End Date Taking? Authorizing Provider  gabapentin (NEURONTIN) 300 MG capsule Take 1 capsule (300 mg total) by mouth 2 (two) times daily. 12/03/13  Yes Daniel J Angiulli, PA-C  glipiZIDE (GLUCOTROL) 5 MG tablet Take 2.5 mg by mouth 2 (two) times daily before a meal.   Yes Historical Provider, MD  lisinopril-hydrochlorothiazide (PRINZIDE,ZESTORETIC) 20-12.5 MG per tablet Take 1 tablet by mouth 2 (two) times daily. 12/03/13  Yes Daniel J Angiulli, PA-C  metFORMIN (GLUCOPHAGE) 1000 MG tablet Take 1 tablet (1,000 mg total) by mouth 2 (two) times daily with a meal. 12/03/13  Yes Daniel J Angiulli, PA-C  simvastatin (ZOCOR) 20 MG tablet TAKE ONE TABLET BY MOUTH DAILY AT 6 PM. 02/14/15  Yes Herminio Commons, MD  HYDROcodone-acetaminophen (NORCO/VICODIN) 5-325 MG tablet Take 1 tablet by mouth every 6 (six) hours as needed. Patient not taking: Reported on 06/29/2015 06/14/15   Carole Civil, MD   BP 121/83 mmHg  Pulse 107  Temp(Src) 98.3 F (36.8 C)  Resp 20  Ht 6\' 1"  (1.854 m)  Wt 201 lb (91.173 kg)  BMI 26.52 kg/m2  SpO2 100% Physical Exam 1520: Physical examination:  Nursing notes reviewed; Vital signs and O2 SAT reviewed;  Constitutional: Well developed,  Well nourished, Well hydrated, In no acute distress; Head:  Normocephalic, atraumatic; Eyes: EOMI, PERRL, No scleral icterus; ENMT: Mouth and pharynx normal, Mucous membranes moist; Neck: Supple, Full range of motion, No lymphadenopathy; Cardiovascular: Regular rate and rhythm, No gallop; Respiratory: Breath sounds clear & equal bilaterally, No wheezes.  Speaking full sentences with ease, Normal respiratory effort/excursion; Chest: Nontender, Movement normal; Abdomen: Soft, Nontender, Nondistended, Normal bowel sounds; Genitourinary: No CVA tenderness; Extremities: Palp pedal pulses, +healed abrasion dorsal left foot with mild localized tenderness, +mild localized edema to left dorsal foot and ankle. No rash, no ecchymosis. No calf tenderness, edema or asymmetry.; Neuro: AA&Ox3, Major CN grossly intact.  Speech clear. No gross focal motor or sensory deficits in extremities.; Skin: Color normal, Warm, Dry.   ED Course  Procedures (including critical care time) Labs Review  Imaging Review  I have personally reviewed and evaluated these images and lab results as part of my medical decision-making.   EKG Interpretation None  MDM  MDM Reviewed: previous chart, nursing note and vitals Reviewed previous: x-ray Interpretation: x-ray and ultrasound     Dg Ankle Complete Left 06/29/2015  CLINICAL DATA:  Dropped frozen chicken on top of foot several weeks ago. EXAM: LEFT ANKLE COMPLETE - 3+ VIEW COMPARISON:  None. FINDINGS: There is no evidence of fracture, dislocation, or joint effusion. There is no evidence of arthropathy or other focal bone abnormality. Vascular calcifications is noted. IMPRESSION: 1. No acute bone abnormality. Electronically Signed   By: Kerby Moors M.D.   On: 06/29/2015 16:08   US Venous Img Lower Unilateral Left 06/29/2015  CLINICAL DATA:  Left lower extremity pain and swelling x4 weeks post trauma. EXAM: LEFT LOWER EXTREMITY VENOUS DOPPLER ULTRASOUND TECHNIQUE:  Gray-scale sonography with compression, as well as color and duplex ultrasound, were performed to evaluate the deep venous system from the level of the common femoral vein through the popliteal and proximal calf veins. COMPARISON:  None FINDINGS: Normal compressibility of the common femoral, superficial femoral, and popliteal veins, as well as the proximal calf veins. No filling defects to suggest DVT on grayscale or color Doppler imaging. Doppler waveforms show normal direction of venous flow, normal respiratory phasicity and response to augmentation. Survey views of the contralateral common femoral vein are unremarkable. IMPRESSION: No evidence of LEFT lower extremity deep vein thrombosis. Electronically Signed   By: Lucrezia Europe M.D.   On: 06/29/2015 15:59   Dg Foot Complete Left 06/29/2015  CLINICAL DATA:  Dropped frozen chicken on top of foot. Pain to top of foot. EXAM: LEFT FOOT - COMPLETE 3+ VIEW COMPARISON:  None. FINDINGS: There is no evidence of fracture or dislocation. There is no evidence of arthropathy or other focal bone abnormality. Soft tissues are unremarkable. IMPRESSION: No fracture identified. Electronically Signed   By: Kerby Moors M.D.   On: 06/29/2015 16:11    1615:  Workup reassuring. No open wounds, +palp pulses, muscles compartments soft. Pt "feels better after the percocet" and is requesting rx for narcotic pain med. [place] Controlled Substance Database accessed: in the past 4 weeks, pt has received 4 different narcotic rx, written by 4 different providers; with last rx filled on 06/16/15. Explained to pt the ED was not the venue to fill chronic narcotic pain meds; pt verb understanding.  Pt endorses acute flair of his usual chronic pain today, no change from his usual chronic pain pattern.  Pt encouraged to f/u with his PMD for good continuity of care and control of his chronic pain.  Pt verb understanding.      Francine Graven, DO 07/02/15 [phone]

## 2015-06-29 NOTE — ED Notes (Addendum)
PT c/o pain and swelling to left lower ankle and foot for 3-4 weeks and stated he was referred back to ED for eval from Outpatient rehab today. No open areas noted but states burning to bottom of foot and pain radiating to upper leg. PT states he has diabetes and has had an xray on LLE x3 weeks ago as well.

## 2015-06-29 NOTE — ED Notes (Signed)
Patient with no complaints at this time. Respirations even and unlabored. Skin warm/dry. Discharge instructions reviewed with patient at this time. Patient given opportunity to voice concerns/ask questions. Patient discharged at this time and left Emergency Department with steady gait.   

## 2015-07-18 ENCOUNTER — Inpatient Hospital Stay (HOSPITAL_COMMUNITY)
Admission: EM | Admit: 2015-07-18 | Discharge: 2015-07-20 | DRG: 603 | Disposition: A | Discharge status: Home / Self Care | Payer: Medicaid Other | Attending: Internal Medicine | Admitting: Internal Medicine

## 2015-07-18 ENCOUNTER — Encounter (HOSPITAL_COMMUNITY): Payer: Self-pay | Admitting: Emergency Medicine

## 2015-07-18 ENCOUNTER — Emergency Department (HOSPITAL_COMMUNITY): Payer: Medicaid Other

## 2015-07-18 DIAGNOSIS — T39395A Adverse effect of other nonsteroidal anti-inflammatory drugs [NSAID], initial encounter: Secondary | ICD-10-CM

## 2015-07-18 DIAGNOSIS — N183 Chronic kidney disease, stage 3 unspecified: Secondary | ICD-10-CM

## 2015-07-18 DIAGNOSIS — Z8673 Personal history of transient ischemic attack (TIA), and cerebral infarction without residual deficits: Secondary | ICD-10-CM

## 2015-07-18 DIAGNOSIS — I129 Hypertensive chronic kidney disease with stage 1 through stage 4 chronic kidney disease, or unspecified chronic kidney disease: Secondary | ICD-10-CM | POA: Diagnosis present

## 2015-07-18 DIAGNOSIS — L97529 Non-pressure chronic ulcer of other part of left foot with unspecified severity: Secondary | ICD-10-CM | POA: Diagnosis present

## 2015-07-18 DIAGNOSIS — I739 Peripheral vascular disease, unspecified: Secondary | ICD-10-CM | POA: Diagnosis present

## 2015-07-18 DIAGNOSIS — I96 Gangrene, not elsewhere classified: Secondary | ICD-10-CM

## 2015-07-18 DIAGNOSIS — E11621 Type 2 diabetes mellitus with foot ulcer: Secondary | ICD-10-CM | POA: Diagnosis present

## 2015-07-18 DIAGNOSIS — E1152 Type 2 diabetes mellitus with diabetic peripheral angiopathy with gangrene: Secondary | ICD-10-CM | POA: Diagnosis present

## 2015-07-18 DIAGNOSIS — I10 Essential (primary) hypertension: Secondary | ICD-10-CM

## 2015-07-18 DIAGNOSIS — E13621 Other specified diabetes mellitus with foot ulcer: Secondary | ICD-10-CM | POA: Diagnosis not present

## 2015-07-18 DIAGNOSIS — J45909 Unspecified asthma, uncomplicated: Secondary | ICD-10-CM | POA: Diagnosis present

## 2015-07-18 DIAGNOSIS — Z8249 Family history of ischemic heart disease and other diseases of the circulatory system: Secondary | ICD-10-CM

## 2015-07-18 DIAGNOSIS — L03116 Cellulitis of left lower limb: Principal | ICD-10-CM | POA: Diagnosis present

## 2015-07-18 DIAGNOSIS — Z720 Tobacco use: Secondary | ICD-10-CM | POA: Diagnosis present

## 2015-07-18 DIAGNOSIS — Z981 Arthrodesis status: Secondary | ICD-10-CM

## 2015-07-18 DIAGNOSIS — K921 Melena: Secondary | ICD-10-CM | POA: Diagnosis present

## 2015-07-18 DIAGNOSIS — F17210 Nicotine dependence, cigarettes, uncomplicated: Secondary | ICD-10-CM | POA: Diagnosis present

## 2015-07-18 DIAGNOSIS — D649 Anemia, unspecified: Secondary | ICD-10-CM

## 2015-07-18 DIAGNOSIS — L03032 Cellulitis of left toe: Secondary | ICD-10-CM

## 2015-07-18 DIAGNOSIS — Z8711 Personal history of peptic ulcer disease: Secondary | ICD-10-CM

## 2015-07-18 DIAGNOSIS — D62 Acute posthemorrhagic anemia: Secondary | ICD-10-CM | POA: Diagnosis present

## 2015-07-18 DIAGNOSIS — Z803 Family history of malignant neoplasm of breast: Secondary | ICD-10-CM

## 2015-07-18 DIAGNOSIS — L0390 Cellulitis, unspecified: Secondary | ICD-10-CM | POA: Diagnosis present

## 2015-07-18 DIAGNOSIS — K922 Gastrointestinal hemorrhage, unspecified: Secondary | ICD-10-CM

## 2015-07-18 DIAGNOSIS — E1165 Type 2 diabetes mellitus with hyperglycemia: Secondary | ICD-10-CM | POA: Diagnosis present

## 2015-07-18 DIAGNOSIS — E1122 Type 2 diabetes mellitus with diabetic chronic kidney disease: Secondary | ICD-10-CM | POA: Diagnosis present

## 2015-07-18 DIAGNOSIS — E785 Hyperlipidemia, unspecified: Secondary | ICD-10-CM | POA: Diagnosis present

## 2015-07-18 DIAGNOSIS — Z7984 Long term (current) use of oral hypoglycemic drugs: Secondary | ICD-10-CM

## 2015-07-18 DIAGNOSIS — E1140 Type 2 diabetes mellitus with diabetic neuropathy, unspecified: Secondary | ICD-10-CM | POA: Diagnosis present

## 2015-07-18 DIAGNOSIS — N189 Chronic kidney disease, unspecified: Secondary | ICD-10-CM

## 2015-07-18 DIAGNOSIS — Z833 Family history of diabetes mellitus: Secondary | ICD-10-CM

## 2015-07-18 DIAGNOSIS — B1920 Unspecified viral hepatitis C without hepatic coma: Secondary | ICD-10-CM | POA: Diagnosis present

## 2015-07-18 DIAGNOSIS — L97509 Non-pressure chronic ulcer of other part of unspecified foot with unspecified severity: Secondary | ICD-10-CM

## 2015-07-18 DIAGNOSIS — L03119 Cellulitis of unspecified part of limb: Secondary | ICD-10-CM | POA: Diagnosis present

## 2015-07-18 LAB — COMPREHENSIVE METABOLIC PANEL
ALBUMIN: 3.6 g/dL (ref 3.5–5.0)
ALK PHOS: 51 U/L (ref 38–126)
ALT: 14 U/L — AB (ref 17–63)
AST: 18 U/L (ref 15–41)
Anion gap: 6 (ref 5–15)
BUN: 29 mg/dL — ABNORMAL HIGH (ref 6–20)
CALCIUM: 9.4 mg/dL (ref 8.9–10.3)
CO2: 26 mmol/L (ref 22–32)
CREATININE: 1.43 mg/dL — AB (ref 0.61–1.24)
Chloride: 102 mmol/L (ref 101–111)
GFR calc Af Amer: 59 mL/min — ABNORMAL LOW (ref >60)
GFR calc non Af Amer: 51 mL/min — ABNORMAL LOW (ref >60)
GLUCOSE: 318 mg/dL — AB (ref 65–99)
Potassium: 4.6 mmol/L (ref 3.5–5.1)
SODIUM: 134 mmol/L — AB (ref 135–145)
Total Bilirubin: 0.3 mg/dL (ref 0.3–1.2)
Total Protein: 8.8 g/dL — ABNORMAL HIGH (ref 6.5–8.1)

## 2015-07-18 LAB — CBC WITH DIFFERENTIAL/PLATELET
BASOS PCT: 0 %
Basophils Absolute: 0 10*3/uL (ref 0.0–0.1)
EOS ABS: 0 10*3/uL (ref 0.0–0.7)
Eosinophils Relative: 0 %
HCT: 24.9 % — ABNORMAL LOW (ref 39.0–52.0)
HEMOGLOBIN: 7.7 g/dL — AB (ref 13.0–17.0)
Lymphocytes Relative: 14 %
Lymphs Abs: 1.8 10*3/uL (ref 0.7–4.0)
MCH: 27 pg (ref 26.0–34.0)
MCHC: 30.9 g/dL (ref 30.0–36.0)
MCV: 87.4 fL (ref 78.0–100.0)
Monocytes Absolute: 0.8 10*3/uL (ref 0.1–1.0)
Monocytes Relative: 6 %
NEUTROS PCT: 80 %
Neutro Abs: 10.3 10*3/uL — ABNORMAL HIGH (ref 1.7–7.7)
Platelets: 414 10*3/uL — ABNORMAL HIGH (ref 150–400)
RBC: 2.85 MIL/uL — AB (ref 4.22–5.81)
RDW: 13.9 % (ref 11.5–15.5)
WBC: 13 10*3/uL — AB (ref 4.0–10.5)

## 2015-07-18 LAB — GLUCOSE, CAPILLARY
Glucose-Capillary: 252 mg/dL — ABNORMAL HIGH (ref 65–99)
Glucose-Capillary: 256 mg/dL — ABNORMAL HIGH (ref 65–99)

## 2015-07-18 LAB — PREPARE RBC (CROSSMATCH)

## 2015-07-18 LAB — I-STAT CG4 LACTIC ACID, ED
LACTIC ACID, VENOUS: 2.14 mmol/L — AB (ref 0.5–2.0)
Lactic Acid, Venous: 2.74 mmol/L (ref 0.5–2.0)

## 2015-07-18 LAB — CBG MONITORING, ED
Glucose-Capillary: 207 mg/dL — ABNORMAL HIGH (ref 65–99)
Glucose-Capillary: 82 mg/dL (ref 65–99)

## 2015-07-18 MED ORDER — LISINOPRIL-HYDROCHLOROTHIAZIDE 20-12.5 MG PO TABS: 1.0000 | ORAL_TABLET | Freq: Two times a day (BID) | ORAL | Status: DC

## 2015-07-18 MED ORDER — LISINOPRIL 10 MG PO TABS
20.0000 mg | ORAL_TABLET | Freq: Two times a day (BID) | ORAL | Status: DC
  Administered 2015-07-18 – 2015-07-20 (×4): 20 mg via ORAL
  Filled 2015-07-18 (×3): qty 2

## 2015-07-18 MED ORDER — HYDROCHLOROTHIAZIDE 12.5 MG PO CAPS
12.5000 mg | ORAL_CAPSULE | Freq: Two times a day (BID) | ORAL | Status: DC
  Administered 2015-07-18 – 2015-07-20 (×4): 12.5 mg via ORAL
  Filled 2015-07-18 (×3): qty 1

## 2015-07-18 MED ORDER — INSULIN ASPART 100 UNIT/ML ~~LOC~~ SOLN
10.0000 [IU] | Freq: Once | SUBCUTANEOUS | Status: AC
  Administered 2015-07-18: 10 [IU] via INTRAVENOUS
  Filled 2015-07-18: qty 1

## 2015-07-18 MED ORDER — ACETAMINOPHEN 650 MG RE SUPP: 650.0000 mg | Freq: Four times a day (QID) | RECTAL | Status: DC | PRN

## 2015-07-18 MED ORDER — VANCOMYCIN HCL IN DEXTROSE 1-5 GM/200ML-% IV SOLN
1000.0000 mg | Freq: Once | INTRAVENOUS | Status: AC
  Administered 2015-07-18: 1000 mg via INTRAVENOUS
  Filled 2015-07-18: qty 200

## 2015-07-18 MED ORDER — ONDANSETRON HCL 4 MG/2ML IJ SOLN: 4.0000 mg | Freq: Four times a day (QID) | INTRAMUSCULAR | Status: DC | PRN

## 2015-07-18 MED ORDER — GABAPENTIN 300 MG PO CAPS
300.0000 mg | ORAL_CAPSULE | Freq: Two times a day (BID) | ORAL | Status: DC
  Administered 2015-07-18 – 2015-07-20 (×4): 300 mg via ORAL
  Filled 2015-07-18 (×3): qty 1

## 2015-07-18 MED ORDER — METFORMIN HCL 500 MG PO TABS
1000.0000 mg | ORAL_TABLET | Freq: Two times a day (BID) | ORAL | Status: DC
  Administered 2015-07-19 – 2015-07-20 (×2): 1000 mg via ORAL
  Filled 2015-07-18 (×3): qty 2

## 2015-07-18 MED ORDER — SODIUM CHLORIDE 0.9 % IV SOLN
INTRAVENOUS | Status: DC
  Administered 2015-07-18: 19:00:00 via INTRAVENOUS

## 2015-07-18 MED ORDER — SIMVASTATIN 20 MG PO TABS
20.0000 mg | ORAL_TABLET | Freq: Every day | ORAL | Status: DC
  Administered 2015-07-18 – 2015-07-19 (×2): 20 mg via ORAL
  Filled 2015-07-18 (×2): qty 1

## 2015-07-18 MED ORDER — SODIUM CHLORIDE 0.9 % IV BOLUS (SEPSIS)
1000.0000 mL | Freq: Once | INTRAVENOUS | Status: AC
  Administered 2015-07-18: 1000 mL via INTRAVENOUS

## 2015-07-18 MED ORDER — SODIUM CHLORIDE 0.9 % IV SOLN: 10.0000 mL/h | Freq: Once | INTRAVENOUS | Status: DC

## 2015-07-18 MED ORDER — HYDROCODONE-ACETAMINOPHEN 5-325 MG PO TABS
1.0000 | ORAL_TABLET | ORAL | Status: DC | PRN
  Administered 2015-07-18 – 2015-07-20 (×4): 2 via ORAL
  Filled 2015-07-18 (×4): qty 2

## 2015-07-18 MED ORDER — PANTOPRAZOLE SODIUM 40 MG IV SOLR
40.0000 mg | Freq: Once | INTRAVENOUS | Status: AC
  Administered 2015-07-18: 40 mg via INTRAVENOUS
  Filled 2015-07-18: qty 40

## 2015-07-18 MED ORDER — ENOXAPARIN SODIUM 40 MG/0.4ML ~~LOC~~ SOLN
40.0000 mg | SUBCUTANEOUS | Status: DC
  Administered 2015-07-18 – 2015-07-19 (×2): 40 mg via SUBCUTANEOUS
  Filled 2015-07-18 (×2): qty 0.4

## 2015-07-18 MED ORDER — INSULIN ASPART 100 UNIT/ML ~~LOC~~ SOLN
0.0000 [IU] | Freq: Three times a day (TID) | SUBCUTANEOUS | Status: DC
  Administered 2015-07-19 – 2015-07-20 (×2): 3 [IU] via SUBCUTANEOUS

## 2015-07-18 MED ORDER — GLIPIZIDE 5 MG PO TABS
5.0000 mg | ORAL_TABLET | Freq: Two times a day (BID) | ORAL | Status: DC
  Administered 2015-07-19 – 2015-07-20 (×2): 5 mg via ORAL
  Filled 2015-07-18 (×3): qty 1

## 2015-07-18 MED ORDER — VANCOMYCIN HCL IN DEXTROSE 1-5 GM/200ML-% IV SOLN
1000.0000 mg | Freq: Two times a day (BID) | INTRAVENOUS | Status: DC
  Administered 2015-07-19 – 2015-07-20 (×4): 1000 mg via INTRAVENOUS
  Filled 2015-07-18 (×4): qty 200

## 2015-07-18 MED ORDER — DOCUSATE SODIUM 100 MG PO CAPS
100.0000 mg | ORAL_CAPSULE | Freq: Two times a day (BID) | ORAL | Status: DC
  Administered 2015-07-18 – 2015-07-20 (×3): 100 mg via ORAL
  Filled 2015-07-18 (×3): qty 1

## 2015-07-18 MED ORDER — MORPHINE SULFATE (PF) 4 MG/ML IV SOLN
4.0000 mg | Freq: Once | INTRAVENOUS | Status: AC
  Administered 2015-07-18: 4 mg via INTRAVENOUS
  Filled 2015-07-18: qty 1

## 2015-07-18 MED ORDER — ACETAMINOPHEN 325 MG PO TABS: 650.0000 mg | ORAL_TABLET | Freq: Four times a day (QID) | ORAL | Status: DC | PRN

## 2015-07-18 MED ORDER — ONDANSETRON HCL 4 MG/2ML IJ SOLN
4.0000 mg | Freq: Once | INTRAMUSCULAR | Status: AC
  Administered 2015-07-18: 4 mg via INTRAVENOUS
  Filled 2015-07-18: qty 2

## 2015-07-18 MED ORDER — ONDANSETRON HCL 4 MG PO TABS: 4.0000 mg | ORAL_TABLET | Freq: Four times a day (QID) | ORAL | Status: DC | PRN

## 2015-07-18 MED ORDER — INSULIN ASPART 100 UNIT/ML ~~LOC~~ SOLN
0.0000 [IU] | Freq: Every day | SUBCUTANEOUS | Status: DC
  Administered 2015-07-18: 3 [IU] via SUBCUTANEOUS
  Administered 2015-07-19: 2 [IU] via SUBCUTANEOUS

## 2015-07-18 MED ORDER — MORPHINE SULFATE (PF) 2 MG/ML IV SOLN
2.0000 mg | INTRAVENOUS | Status: DC | PRN
  Administered 2015-07-18 – 2015-07-19 (×2): 4 mg via INTRAVENOUS
  Administered 2015-07-19 (×2): 2 mg via INTRAVENOUS
  Administered 2015-07-19: 4 mg via INTRAVENOUS
  Administered 2015-07-19 – 2015-07-20 (×4): 2 mg via INTRAVENOUS
  Filled 2015-07-18 (×4): qty 1
  Filled 2015-07-18: qty 2
  Filled 2015-07-18 (×3): qty 1
  Filled 2015-07-18 (×2): qty 2

## 2015-07-18 NOTE — ED Notes (Signed)
PT stated he was evaluated x2 weeks ago for injury to left foot and since then foot has become more painful and has open areas noted. PT states decreased sensation to left foot. Eschar tissue noted to left foot great toe and 4th metatarsal with narcotic tissue.

## 2015-07-18 NOTE — H&P (Addendum)
[facility] History and Physical  [name] [id number] DOB: 1950-10-12 DOA: 07/18/2015  Referring physician: Dr Gilford Raid PCP: Maggie Font, MD   Chief Complaint: Foot ulcer and anemia  HPI: Cory Weber is a 65 y.o. male with hx DM2, HTN, hep C and asthma came to ED for L foot and great toe pain w ulcer.  +chills, no fevers.  He soaked his foot in hot water but due to neuropathy he didn't know how hot is was and ended up with blisters on his great toe.  In ED pt rec'd IV abx and patient says "the blisters on it have already gone down".  Also patient Hb is low at 7.4 and ED MD did rectal exam which showed black tarry stool guiac +.  Asked to see for admission.   Patient has been taking "tons" of 800 mg Advil over the past two weeks because of foot and toe pain.  NO hx of PUD or GIB in the past.      Pt was here in 2012 with acute L thalamus stroke, R sided numbness which improved.  +cocaine and tobacco use. In 2015 has back surgery for disc rupture and had rehab at [facility].  Has been on disabliity since.  He lives alone, smokes cigarettes, no etoh. Has one daughter in her 35's, lives in [place].  Worked in [employer] prior to disability.  Grew up and lives in [place].    ROS  denies CP  no joint pain   no HA  no blurry vision  no rash  no diarrhea  no nausea/ vomiting  no dysuria  no difficulty voiding  no change in urine color    Past Medical History  Past Medical History  Diagnosis Date  . Diabetes mellitus   . Hypertension   . Hepatitis C     states he was diagnosed in 2007 or 2007 while living in [place], [place]  . CVA (cerebral infarction) 01/2011  . Hyperlipidemia   . Asthma   . PVD (peripheral vascular disease) ([place])   . Anemia   . Stroke ([place])   . Headache(784.0)   . Peripheral neuropathy [facility])    Past Surgical History  Past Surgical History  Procedure Laterality Date  . Liver biopsy  2005    Done in [place], [place]. Chronic hepatitis with  mild periportal inflammation, lobular unicellular necrosis and portal fibrosis. Grade 2, stage 1-2.  Marland Kitchen Colonoscopy with propofol N/A 09/03/2012    [id number]:[id number] polyp-removed as outlined above. Prominent internal hemorrhoids. Tubular adenoma  . Esophagogastroduodenoscopy (egd) with propofol N/A 09/03/2012    HTX:HFSFSE hernia. Gastric diverticulum. Gastric ulcers with associated erosions. Duodenal erosions. Status post gastric biopsy. H.PYLORI gastritis   . Biopsy N/A 09/03/2012    Procedure: BIOPSY;  Surgeon: Daneil Dolin, MD;  Location: [facility];  Service: Endoscopy;  Laterality: N/A;  gastric and gastric mucosa  . Polypectomy N/A 09/03/2012    Procedure: POLYPECTOMY;  Surgeon: Daneil Dolin, MD;  Location: [facility];  Service: Endoscopy;  Laterality: N/A;  cecal polyp  . Esophagogastroduodenoscopy (egd) with propofol N/A 12/03/2012    Dr. Gala Romney: gastric diverticulum, gastric erosions and scar. Previously noted gastric ulcer completed healed. Biopsy without H.pylori.   . Biopsy N/A 12/03/2012    Procedure: BIOPSY;  Surgeon: Daneil Dolin, MD;  Location: [facility];  Service: Endoscopy;  Laterality: N/A;  . Maximum access (mas)posterior lumbar interbody fusion (plif) 1 level N/A 11/25/2013    Procedure: FOR MAXIMUM ACCESS (MAS)  POSTERIOR LUMBAR INTERBODY FUSION (PLIF) 1 LEVEL;  Surgeon: Eustace Moore, MD;  Location: [facility] NEURO ORS;  Service: Neurosurgery;  Laterality: N/A;  FOR MAXIMUM ACCESS (MAS) POSTERIOR LUMBAR INTERBODY FUSION (PLIF) 1 LEVEL LUMBAR 3-4   Family History  Family History  Problem Relation Age of Onset  . Breast cancer Mother     deceased  . Cancer Mother   . Diabetes Father   . Hypertension Father   . Heart disease Father     deceased  . Hyperlipidemia Father   . Diabetes Sister   . Hypertension Sister   . Breast cancer Sister   . Colon cancer Neg Hx   . Liver disease Neg Hx    Social History  reports that he has been smoking Cigarettes.  He has a 10 pack-year smoking history.  He has never used smokeless tobacco. He reports that he does not drink alcohol or use illicit drugs. Allergies No Known Allergies Home medications Prior to Admission medications   Medication Sig Start Date End Date Taking? Authorizing Provider  gabapentin (NEURONTIN) 300 MG capsule Take 1 capsule (300 mg total) by mouth 2 (two) times daily. 12/03/13  Yes Daniel J Angiulli, PA-C  glipiZIDE (GLUCOTROL) 5 MG tablet Take 5 mg by mouth 2 (two) times daily before a meal.    Yes Historical Provider, MD  lisinopril-hydrochlorothiazide (PRINZIDE,ZESTORETIC) 20-12.5 MG per tablet Take 1 tablet by mouth 2 (two) times daily. 12/03/13  Yes Daniel J Angiulli, PA-C  metFORMIN (GLUCOPHAGE) 1000 MG tablet Take 1 tablet (1,000 mg total) by mouth 2 (two) times daily with a meal. 12/03/13  Yes Daniel J Angiulli, PA-C  simvastatin (ZOCOR) 20 MG tablet TAKE ONE TABLET BY MOUTH DAILY AT 6 PM. 02/14/15  Yes Herminio Commons, MD  HYDROcodone-acetaminophen (NORCO/VICODIN) 5-325 MG tablet Take 1 tablet by mouth every 6 (six) hours as needed. Patient not taking: Reported on 06/29/2015 06/14/15   Carole Civil, MD  oxyCODONE-acetaminophen (PERCOCET/ROXICET) 5-325 MG tablet 1 tabs PO q6h prn pain Patient not taking: Reported on 07/18/2015 06/29/15   Francine Graven, DO   Liver Function Tests  Recent Labs Lab 07/18/15 1330  AST 18  ALT 14*  ALKPHOS 51  BILITOT 0.3  PROT 8.8*  ALBUMIN 3.6   No results for input(s): LIPASE, AMYLASE in the last 168 hours. CBC  Recent Labs Lab 07/18/15 1330  WBC 13.0*  NEUTROABS 10.3*  HGB 7.7*  HCT 24.9*  MCV 87.4  PLT 027*   Basic Metabolic Panel  Recent Labs Lab 07/18/15 1330  NA 134*  K 4.6  CL 102  CO2 26  GLUCOSE 318*  BUN 29*  CREATININE 1.43*  CALCIUM 9.4     Filed Vitals:   07/18/15 1600 07/18/15 1617 07/18/15 1630 07/18/15 1635  BP:  155/86 164/97 151/92  Pulse: 97 91 91 95  Temp:  99 F (37.2 C)  99 F (37.2 C)  TempSrc:  Oral  Oral  Resp: 20  18 15 16   Height:      Weight:      SpO2: 99% 95% 99% 100%   Exam: Gen alert no distress No rash, cyanosis or gangrene Sclera anicteric, throat clear  No jvd or bruits Chest clear bilat RRR no MRG Abd soft ntnd no mass or ascites +bs GU normal male MS no joint effusions or deformity Ext R great toe ulcer on the back upper part of toe, shallow, no sig depth; surrounding erythema and/or whitening of tissue of the distal  toe.  Some mild erythema of forefoot also.  Nontender, has sig sensory deficits in both feets Neuro is alert, Ox 3 , nf  Na 134  K 4.6  BUN 29  Cr 1.43  Glu 318 WBC 13  Hb 7.7  plt 414 Xray L foot - no osteo or fx    Assessment: 1  L foot infection w great toe ulcer/ cellulitis in diabetic w sig neuropathy - plan admit IV abx, IVF 2  Anemia d/t acute blood loss/ melena - suspect PUD w GIB due to heavy nsaid's x 2-3 wks. Getting prbc's now.  F/U CBC in am. 3  HTN cont acei/ HCT 4  CKD stage 3 - creat at baseline ~1.5, eGFR 50-60 5  Hx hep C 6  DM non insulin dep- cont po meds +SSI  Plan - as above     Sol Blazing [facility] Pager [phone]  Cell [phone]  If 7PM-7AM, please contact night-coverage [email or web address] Password [email or web address] 07/18/2015, 5:29 PM

## 2015-07-18 NOTE — Progress Notes (Signed)
Pharmacy Antibiotic Note  Cory Weber is a 65 y.o. male admitted on 07/18/2015 with cellulitis and foot ulcer.  Pharmacy has been consulted for Vancomycin dosing.  Plan: Vancomycin 1gm IV every 12 hours.  Goal trough 10-15 mcg/mL.  Height: 6\' 1"  (185.4 cm) Weight: 199 lb 14.4 oz (90.674 kg) IBW/kg (Calculated) : 79.9  Temp (24hrs), Avg:98.7 F (37.1 C), Min:98.4 F (36.9 C), Max:99 F (37.2 C)   Recent Labs Lab 07/18/15 1330 07/18/15 1343 07/18/15 1800  WBC 13.0*  --   --   CREATININE 1.43*  --   --   LATICACIDVEN  --  2.74* 2.14*    Estimated Creatinine Clearance: 60.5 mL/min (by C-G formula based on Cr of 1.43).    No Known Allergies  Antimicrobials this admission: Vanc 6/6 >>   Dose adjustments this admission: n/a  Microbiology results: 6/6 BCx: pending  Thank you for allowing pharmacy to be a part of this patient's care.  Pricilla Larsson 07/18/2015 8:08 PM

## 2015-07-18 NOTE — ED Provider Notes (Signed)
CSN: [id number]     Arrival date & time 07/18/15  1234 History   First MD Initiated Contact with Patient 07/18/15 1315     Chief Complaint  Patient presents with  . Foot Ulcer   PT WAS HERE ON 5/18 TO EVAL FOR LEFT FOOT PAIN.  AT THE TIME, PT DID NOT HAVE ANY ULCERATIONS.  NOW PT HAS INCREASING PAIN WITH LEFT LARGE TOE AND 4TH TOE ULCERATIONS.  THERE IS ALSO REDNESS AND SWELLING TO FOOT.  (Consider location/radiation/quality/duration/timing/severity/associated sxs/prior Treatment) The history is provided by the patient.    Past Medical History  Diagnosis Date  . Diabetes mellitus   . Hypertension   . Hepatitis C     states he was diagnosed in 2007 or 2007 while living in [place], [place]  . CVA (cerebral infarction) 01/2011  . Hyperlipidemia   . Asthma   . PVD (peripheral vascular disease) ([place])   . Anemia   . Stroke ([place])   . Headache(784.0)   . Peripheral neuropathy [facility])    Past Surgical History  Procedure Laterality Date  . Liver biopsy  2005    Done in [place], [place]. Chronic hepatitis with mild periportal inflammation, lobular unicellular necrosis and portal fibrosis. Grade 2, stage 1-2.  Marland Kitchen Colonoscopy with propofol N/A 09/03/2012    [id number] polyp-removed as outlined above. Prominent internal hemorrhoids. Tubular adenoma  . Esophagogastroduodenoscopy (egd) with propofol N/A 09/03/2012    [id number] hernia. Gastric diverticulum. Gastric ulcers with associated erosions. Duodenal erosions. Status post gastric biopsy. H.PYLORI gastritis   . Biopsy N/A 09/03/2012    Procedure: BIOPSY;  Surgeon: Daneil Dolin, MD;  Location: [facility];  Service: Endoscopy;  Laterality: N/A;  gastric and gastric mucosa  . Polypectomy N/A 09/03/2012    Procedure: POLYPECTOMY;  Surgeon: Daneil Dolin, MD;  Location: [facility];  Service: Endoscopy;  Laterality: N/A;  cecal polyp  . Esophagogastroduodenoscopy (egd) with propofol N/A 12/03/2012    Dr. Gala Romney: gastric diverticulum, gastric erosions and  scar. Previously noted gastric ulcer completed healed. Biopsy without H.pylori.   . Biopsy N/A 12/03/2012    Procedure: BIOPSY;  Surgeon: Daneil Dolin, MD;  Location: [facility];  Service: Endoscopy;  Laterality: N/A;  . Maximum access (mas)posterior lumbar interbody fusion (plif) 1 level N/A 11/25/2013    Procedure: FOR MAXIMUM ACCESS (MAS) POSTERIOR LUMBAR INTERBODY FUSION (PLIF) 1 LEVEL;  Surgeon: Eustace Moore, MD;  Location: [place] [facility];  Service: Neurosurgery;  Laterality: N/A;  FOR MAXIMUM ACCESS (MAS) POSTERIOR LUMBAR INTERBODY FUSION (PLIF) 1 LEVEL LUMBAR 3-4   Family History  Problem Relation Age of Onset  . Breast cancer Mother     deceased  . Cancer Mother   . Diabetes Father   . Hypertension Father   . Heart disease Father     deceased  . Hyperlipidemia Father   . Diabetes Sister   . Hypertension Sister   . Breast cancer Sister   . Colon cancer Neg Hx   . Liver disease Neg Hx    Social History  Substance Use Topics  . Smoking status: Current Every Day Smoker -- 0.50 packs/day for 20 years    Types: Cigarettes  . Smokeless tobacco: Never Used     Comment: given 1-800-Quit Now  . Alcohol Use: No    Review of Systems  Skin: Positive for wound.  All other systems reviewed and are negative.     Allergies  Review of patient's allergies indicates no known allergies.  Home Medications  Prior to Admission medications   Medication Sig Start Date End Date Taking? Authorizing Provider  gabapentin (NEURONTIN) 300 MG capsule Take 1 capsule (300 mg total) by mouth 2 (two) times daily. 12/03/13  Yes Daniel J Angiulli, PA-C  glipiZIDE (GLUCOTROL) 5 MG tablet Take 5 mg by mouth 2 (two) times daily before a meal.    Yes Historical Provider, MD  lisinopril-hydrochlorothiazide (PRINZIDE,ZESTORETIC) 20-12.5 MG per tablet Take 1 tablet by mouth 2 (two) times daily. 12/03/13  Yes Daniel J Angiulli, PA-C  metFORMIN (GLUCOPHAGE) 1000 MG tablet Take 1 tablet (1,000 mg total) by  mouth 2 (two) times daily with a meal. 12/03/13  Yes Daniel J Angiulli, PA-C  simvastatin (ZOCOR) 20 MG tablet TAKE ONE TABLET BY MOUTH DAILY AT 6 PM. 02/14/15  Yes Herminio Commons, MD  HYDROcodone-acetaminophen (NORCO/VICODIN) 5-325 MG tablet Take 1 tablet by mouth every 6 (six) hours as needed. Patient not taking: Reported on 06/29/2015 06/14/15   Carole Civil, MD  oxyCODONE-acetaminophen (PERCOCET/ROXICET) 5-325 MG tablet 1 tabs PO q6h prn pain Patient not taking: Reported on 07/18/2015 06/29/15   Francine Graven, DO   BP 155/86 mmHg  Pulse 91  Temp(Src) 99 F (37.2 C) (Oral)  Resp 18  Ht 6\' 1"  (1.854 m)  Wt 195 lb (88.451 kg)  BMI 25.73 kg/m2  SpO2 95% Physical Exam  Constitutional: He is oriented to person, place, and time. He appears well-developed and well-nourished.  HENT:  Head: Normocephalic and atraumatic.  Right Ear: External ear normal.  Left Ear: External ear normal.  Nose: Nose normal.  Mouth/Throat: Oropharynx is clear and moist.  Eyes: Conjunctivae and EOM are normal. Pupils are equal, round, and reactive to light.  Neck: Normal range of motion. Neck supple.  Cardiovascular: Normal rate, regular rhythm, normal heart sounds and intact distal pulses.   Pulmonary/Chest: Effort normal and breath sounds normal.  Abdominal: Soft. Bowel sounds are normal.  Genitourinary: Guaiac positive stool.  STOOL IS BLACK  Musculoskeletal: Normal range of motion. He exhibits tenderness.  Neurological: He is alert and oriented to person, place, and time.  Skin: Skin is warm and dry.  ULCER TO TOP OF RIGHT 1ST AND 4TH TOE.  CELLULITIS AND LYMPHANGITIS.  Psychiatric: He has a normal mood and affect. His behavior is normal. Judgment and thought content normal.  Nursing note and vitals reviewed.   ED Course  Procedures (including critical care time) Labs Review Labs Reviewed  CBC WITH DIFFERENTIAL/PLATELET - Abnormal; Notable for the following:    WBC 13.0 (*)    RBC 2.85 (*)     Hemoglobin 7.7 (*)    HCT 24.9 (*)    Platelets 414 (*)    Neutro Abs 10.3 (*)    All other components within normal limits  COMPREHENSIVE METABOLIC PANEL - Abnormal; Notable for the following:    Sodium 134 (*)    Glucose, Bld 318 (*)    BUN 29 (*)    Creatinine, Ser 1.43 (*)    Total Protein 8.8 (*)    ALT 14 (*)    GFR calc non Af Amer 51 (*)    GFR calc Af Amer 59 (*)    All other components within normal limits  I-STAT CG4 LACTIC ACID, ED - Abnormal; Notable for the following:    Lactic Acid, Venous 2.74 (*)    All other components within normal limits  CBG MONITORING, ED - Abnormal; Notable for the following:    Glucose-Capillary 207 (*)  All other components within normal limits  CULTURE, BLOOD (ROUTINE X 2)  CULTURE, BLOOD (ROUTINE X 2)  POC OCCULT BLOOD, ED  I-STAT CG4 LACTIC ACID, ED  TYPE AND SCREEN  PREPARE RBC (CROSSMATCH)    Imaging Review Dg Foot Complete Left  07/18/2015  CLINICAL DATA:  Left foot pain with ulcer along the great toe and fourth toe for 2 weeks. Diabetic. EXAM: LEFT FOOT - COMPLETE 3+ VIEW COMPARISON:  06/29/2015 prior exams FINDINGS: There is no evidence of acute fracture, subluxation or dislocation. No radiographic evidence of osteomyelitis identified. No radiopaque foreign bodies are present. Vascular calcifications are again noted. IMPRESSION: No acute bony abnormalities. Electronically Signed   By: Margarette Canada M.D.   On: 07/18/2015 14:21   I have personally reviewed and evaluated these images and lab results as part of my medical decision-making.   EKG Interpretation None      MDM  PT D/W DR. Jonnie Finner WHO WILL ADMIT PT FOR OBS. Final diagnoses:  Type 2 diabetes mellitus with left diabetic foot ulcer ([place])  Cellulitis of left foot  Acute post-hemorrhagic anemia  Acute upper GI bleed  Poorly controlled type 2 diabetes mellitus ([place])  CRI (chronic renal insufficiency), unspecified stage       Isla Pence, MD 07/18/15 1625

## 2015-07-19 ENCOUNTER — Observation Stay (HOSPITAL_COMMUNITY): Payer: Medicaid Other

## 2015-07-19 DIAGNOSIS — L03119 Cellulitis of unspecified part of limb: Secondary | ICD-10-CM

## 2015-07-19 DIAGNOSIS — I10 Essential (primary) hypertension: Secondary | ICD-10-CM | POA: Diagnosis not present

## 2015-07-19 DIAGNOSIS — N183 Chronic kidney disease, stage 3 (moderate): Secondary | ICD-10-CM | POA: Diagnosis not present

## 2015-07-19 LAB — BASIC METABOLIC PANEL
ANION GAP: 7 (ref 5–15)
BUN: 19 mg/dL (ref 6–20)
CHLORIDE: 102 mmol/L (ref 101–111)
CO2: 26 mmol/L (ref 22–32)
Calcium: 9 mg/dL (ref 8.9–10.3)
Creatinine, Ser: 1.32 mg/dL — ABNORMAL HIGH (ref 0.61–1.24)
GFR calc non Af Amer: 56 mL/min — ABNORMAL LOW (ref >60)
Glucose, Bld: 152 mg/dL — ABNORMAL HIGH (ref 65–99)
POTASSIUM: 4.3 mmol/L (ref 3.5–5.1)
Sodium: 135 mmol/L (ref 135–145)

## 2015-07-19 LAB — CBC
HEMATOCRIT: 28.3 % — AB (ref 39.0–52.0)
HEMOGLOBIN: 9.2 g/dL — AB (ref 13.0–17.0)
MCH: 28.4 pg (ref 26.0–34.0)
MCHC: 32.5 g/dL (ref 30.0–36.0)
MCV: 87.3 fL (ref 78.0–100.0)
Platelets: 370 10*3/uL (ref 150–400)
RBC: 3.24 MIL/uL — ABNORMAL LOW (ref 4.22–5.81)
RDW: 13.9 % (ref 11.5–15.5)
WBC: 12.5 10*3/uL — ABNORMAL HIGH (ref 4.0–10.5)

## 2015-07-19 LAB — TYPE AND SCREEN
ABO/RH(D): O POS
ANTIBODY SCREEN: NEGATIVE
UNIT DIVISION: 0

## 2015-07-19 LAB — GLUCOSE, CAPILLARY
GLUCOSE-CAPILLARY: 157 mg/dL — AB (ref 65–99)
GLUCOSE-CAPILLARY: 79 mg/dL (ref 65–99)
GLUCOSE-CAPILLARY: 233 mg/dL — AB (ref 65–99)
Glucose-Capillary: 130 mg/dL — ABNORMAL HIGH (ref 65–99)

## 2015-07-19 LAB — OCCULT BLOOD, POC DEVICE: FECAL OCCULT BLD: POSITIVE — AB

## 2015-07-19 NOTE — Consult Note (Addendum)
SURGICAL CONSULTATION NOTE (initial)  HISTORY OF PRESENT ILLNESS (HPI):  65 y.o. tobacco-smoking diabetic male presented with non-healing Left foot wounds, cellulitis x 3 weeks after dropping a frozen chicken on his foot and subsequent burn injury when he tried to wash his foot and didn't realize (didn't feel) how hot the water was that he used to wash his foot. Patient reports pain over the dorsum of his Left foot over the past several days, chronic B/L lower extremity neuropathy and denies cramping calf pain or pain in the toes on one foot or the other with elevation. What burning, symmetric pain he has experienced until recently, he states has been relieved with Neurontin. Though s/p stroke, patient denies any awareness of peripheral arterial disease.  PAST MEDICAL HISTORY (PMH):  Past Medical History  Diagnosis Date  . Diabetes mellitus   . Hypertension   . Hepatitis C     states he was diagnosed in 2007 or 2007 while living in [place], [place]  . CVA (cerebral infarction) 01/2011  . Hyperlipidemia   . Asthma   . PVD (peripheral vascular disease) ([place])   . Anemia   . Stroke ([place])   . Headache(784.0)   . Peripheral neuropathy ([place])      PAST SURGICAL HISTORY ([place]):  Past Surgical History  Procedure Laterality Date  . Liver biopsy  2005    Done in [place], [place]. Chronic hepatitis with mild periportal inflammation, lobular unicellular necrosis and portal fibrosis. Grade 2, stage 1-2.  Marland Kitchen Colonoscopy with propofol N/A 09/03/2012    [id number] polyp-removed as outlined above. Prominent internal hemorrhoids. Tubular adenoma  . Esophagogastroduodenoscopy (egd) with propofol N/A 09/03/2012    [id number] hernia. Gastric diverticulum. Gastric ulcers with associated erosions. Duodenal erosions. Status post gastric biopsy. H.PYLORI gastritis   . Biopsy N/A 09/03/2012    Procedure: BIOPSY;  Surgeon: Daneil Dolin, MD;  Location: [facility];  Service: Endoscopy;  Laterality: N/A;  gastric and  gastric mucosa  . Polypectomy N/A 09/03/2012    Procedure: POLYPECTOMY;  Surgeon: Daneil Dolin, MD;  Location: [facility];  Service: Endoscopy;  Laterality: N/A;  cecal polyp  . Esophagogastroduodenoscopy (egd) with propofol N/A 12/03/2012    Dr. Gala Romney: gastric diverticulum, gastric erosions and scar. Previously noted gastric ulcer completed healed. Biopsy without H.pylori.   . Biopsy N/A 12/03/2012    Procedure: BIOPSY;  Surgeon: Daneil Dolin, MD;  Location: [facility];  Service: Endoscopy;  Laterality: N/A;  . Maximum access (mas)posterior lumbar interbody fusion (plif) 1 level N/A 11/25/2013    Procedure: FOR MAXIMUM ACCESS (MAS) POSTERIOR LUMBAR INTERBODY FUSION (PLIF) 1 LEVEL;  Surgeon: Eustace Moore, MD;  Location: [place] [facility];  Service: Neurosurgery;  Laterality: N/A;  FOR MAXIMUM ACCESS (MAS) POSTERIOR LUMBAR INTERBODY FUSION (PLIF) 1 LEVEL LUMBAR 3-4     MEDICATIONS:  Prior to Admission medications   Medication Sig Start Date End Date Taking? Authorizing Provider  gabapentin (NEURONTIN) 300 MG capsule Take 1 capsule (300 mg total) by mouth 2 (two) times daily. 12/03/13  Yes Daniel J Angiulli, PA-C  glipiZIDE (GLUCOTROL) 5 MG tablet Take 5 mg by mouth 2 (two) times daily before a meal.    Yes Historical Provider, MD  lisinopril-hydrochlorothiazide (PRINZIDE,ZESTORETIC) 20-12.5 MG per tablet Take 1 tablet by mouth 2 (two) times daily. 12/03/13  Yes Daniel J Angiulli, PA-C  metFORMIN (GLUCOPHAGE) 1000 MG tablet Take 1 tablet (1,000 mg total) by mouth 2 (two) times daily with a meal. 12/03/13  Yes Lavon Paganini Angiulli,  PA-C  simvastatin (ZOCOR) 20 MG tablet TAKE ONE TABLET BY MOUTH DAILY AT 6 PM. 02/14/15  Yes Herminio Commons, MD  HYDROcodone-acetaminophen (NORCO/VICODIN) 5-325 MG tablet Take 1 tablet by mouth every 6 (six) hours as needed. Patient not taking: Reported on 06/29/2015 06/14/15   Carole Civil, MD  oxyCODONE-acetaminophen (PERCOCET/ROXICET) 5-325 MG tablet 1 tabs PO q6h prn  pain Patient not taking: Reported on 07/18/2015 06/29/15   Francine Graven, DO     ALLERGIES:  No Known Allergies   SOCIAL HISTORY:  Social History   Social History  . Marital Status: Single    Spouse Name: N/A  . Number of Children: 1  . Years of Education: N/A   Occupational History  . trying to get disability    Social History Main Topics  . Smoking status: Current Every Day Smoker -- 0.50 packs/day for 20 years    Types: Cigarettes  . Smokeless tobacco: Never Used     Comment: given 1-800-Quit Now  . Alcohol Use: No  . Drug Use: No     Comment: last used April 2016.  Marland Kitchen Sexual Activity: Not on file   Other Topics Concern  . Not on file   Social History Narrative    The patient currently resides (home / rehab facility / nursing home): Home  The patient normally is (ambulatory / bedbound): Ambulatory   FAMILY HISTORY:  Family History  Problem Relation Age of Onset  . Breast cancer Mother     deceased  . Cancer Mother   . Diabetes Father   . Hypertension Father   . Heart disease Father     deceased  . Hyperlipidemia Father   . Diabetes Sister   . Hypertension Sister   . Breast cancer Sister   . Colon cancer Neg Hx   . Liver disease Neg Hx      REVIEW OF SYSTEMS:  Constitutional: denies weight loss, fever, chills, or sweats  Eyes: denies any other vision changes, history of eye injury  ENT: denies sore throat, hearing problems  Respiratory: denies shortness of breath, wheezing  Cardiovascular: denies chest pain, palpitations  Gastrointestinal: denies abdominal pain, N/V, or diarrhea  Musculoskeletal: Left foot pain, numbness, redness, wounds as per HPI Skin: denies any other rashes or skin discolorations  Neurological: denies any other headache, dizziness, weakness  Psychiatric: denies any other depression, anxiety   All other review of systems were negative   VITAL SIGNS:  Temp:  [98 F (36.7 C)-98.6 F (37 C)] 98.3 F (36.8 C) (06/07  0646) Pulse Rate:  [74-107] 74 (06/07 0646) Resp:  [18-22] 20 (06/07 0646) BP: (143-160)/(79-82) 143/79 mmHg (06/07 0646) SpO2:  [97 %-100 %] 100 % (06/07 0646) Weight:  [90.674 kg (199 lb 14.4 oz)] 90.674 kg (199 lb 14.4 oz) (06/06 1831)     Height: 6\' 1"  (185.4 cm) Weight: 90.674 kg (199 lb 14.4 oz) BMI (Calculated): 26.4   INTAKE/OUTPUT:  This shift:    Last 2 shifts: @IOLAST2SHIFTS @   PHYSICAL EXAM:  Constitutional:  -- Normal body habitus  -- Awake, alert, and oriented x3  Eyes:  -- Pupils equally round and reactive to light  -- No scleral icterus  Ear, nose, and throat:  -- No jugular venous distension  Pulmonary:  -- No crackles  -- Equal breath sounds bilaterally  Cardiovascular:  -- S1, S2 present  -- No pericardial rubs Abdomen:  -- Soft, nontender, nondistended, no guarding/rebound  -- No abdominal masses appreciated, pulsatile or otherwise  Musculoskeletal / Integumentary:  -- Wounds or skin discoloration: Left 4th toe tip dry gangrene, Left dorsal 1st toe wound with mid-/distal- discoloration, Left 2nd toe tip ulceration, B/L feet warm, no apparent wounds on Right foot -- Extremities: B/L UE and LE FROM, hands and feet warm, Left foot 2+ pitting edema  Neurologic:  -- Motor function: intact and symmetric -- Sensation: severely diminished to light touch and pressure bilaterally  Pulse/Doppler Exam: (p=palpable; d=doppler signals; 0=none)     Right   Left   Brach  p   p   Rad  p   p   Fem  p   p   Pop bi  bi  DP  mono   weak mono   PT  bi   mono   Labs:  CBC:  Lab Results  Component Value Date   WBC 12.5* 07/19/2015   RBC 3.24* 07/19/2015   RBC 4.06* 11/24/2013   BMP:  Lab Results  Component Value Date   GLUCOSE 152* 07/19/2015   CO2 26 07/19/2015   BUN 19 07/19/2015   BUN 10 10/31/2011   CREATININE 1.32* 07/19/2015   CREATININE 0.99 10/31/2011   CALCIUM 9.0 07/19/2015   CALCIUM 9.0 10/31/2011     Imaging studies:  Multilevel segmental  arterial pressures and ABI (07/19/2015) Right ABI: 0.91 Left ABI: 0.45  Right Lower Extremity: Significant pressure differential between the low thigh and calf cuffs suggesting distal femoral popliteal disease. Preserved digital waveforms on PVRs.  Left Lower Extremity: Significant pressure gradient between the low thigh cuff and calf cuff consistent with distal femoral popliteal disease. Additionally, there are significant pressure gradients between the calf and ankle consistent with runoff disease.  Assessment/Plan:  65 y.o. male with LLE critical limb ischemia with non-healing traumatic and burn wounds x 3 weeks at high risk for LLE amputation, complicated by pertinent comorbidities including diabetes mellitus with severe neuropathy, chronic tobacco abuse, hypertension, hyperlipidemia, and prior stroke.    - obtain LLE arterial duplex  - start aspirin 81 mg for cardiovascular risk reduction  - requires transfer to [facility] for revascularization (not yet available at [facility])  - arterial duplex should not delay transfer for revascularization if ready for transfer prior to study  - importance of smoking cessation discussed with patient, as well as high risk for amputation  - paint Left 4th toe dry gangrene with betadine to reduce risk of conversion to wet gangrene (especially once revascularized)  - once revascularized, patient may follow-up at surgery office in [place] if he so wishes/prefers  - medical management of diabetes and otherwise as per medical team  - DVT prophylaxis  All of the above findings and recommendations were discussed with the patient, and all of his questions were answered to his expressed satisfaction.  Thank you for the opportunity to participate in the care for this patient.   -- Marilynne Drivers Rosana Hoes, [place]: [facility] Office: [phone]

## 2015-07-19 NOTE — Progress Notes (Signed)
[facility] PROGRESS NOTE  [name] [id number] DOB: 09/09/1950    PCP:   Maggie Font, MD   HPI: Cory Weber is an 65 y.o. male with hx of hep C, DM, HTN, HLD, prior CVA, lives alone, admitted for cellulitis and anemia with positive OB stool.  He has received PRBC and started on IV vancomycin.  He never had his lower extremity vascular studies.   Rewiew of Systems:  Constitutional: Negative for malaise, fever and chills. No significant weight loss or weight gain Eyes: Negative for eye pain, redness and discharge, diplopia, visual changes, or flashes of light. ENMT: Negative for ear pain, hoarseness, nasal congestion, sinus pressure and sore throat. No headaches; tinnitus, drooling, or problem swallowing. Cardiovascular: Negative for chest pain, palpitations, diaphoresis, dyspnea and peripheral edema. ; No orthopnea, PND Respiratory: Negative for cough, hemoptysis, wheezing and stridor. No pleuritic chestpain. Gastrointestinal: Negative for nausea, vomiting, diarrhea, constipation, abdominal pain, melena, blood in stool, hematemesis, jaundice and rectal bleeding.    Genitourinary: Negative for frequency, dysuria, incontinence,flank pain and hematuria; Musculoskeletal: Negative for back pain and neck pain. Negative for swelling and trauma.;  Skin: . Negative for pruritus, rash, abrasions, bruising and skin lesion.; ulcerations Neuro: Negative for headache, lightheadedness and neck stiffness. Negative for weakness, altered level of consciousness , altered mental status, extremity weakness, burning feet, involuntary movement, seizure and syncope.  Psych: negative for anxiety, depression, insomnia, tearfulness, panic attacks, hallucinations, paranoia, suicidal or homicidal ideation    Past Medical History  Diagnosis Date  . Diabetes mellitus   . Hypertension   . Hepatitis C     states he was diagnosed in 2007 or 2007 while living in [place], [place]  . CVA (cerebral  infarction) 01/2011  . Hyperlipidemia   . Asthma   . PVD (peripheral vascular disease) ([place])   . Anemia   . Stroke ([place])   . Headache(784.0)   . Peripheral neuropathy [facility])     Past Surgical History  Procedure Laterality Date  . Liver biopsy  2005    Done in [place], [place]. Chronic hepatitis with mild periportal inflammation, lobular unicellular necrosis and portal fibrosis. Grade 2, stage 1-2.  Marland Kitchen Colonoscopy with propofol N/A 09/03/2012    [id number] polyp-removed as outlined above. Prominent internal hemorrhoids. Tubular adenoma  . Esophagogastroduodenoscopy (egd) with propofol N/A 09/03/2012    [id number] hernia. Gastric diverticulum. Gastric ulcers with associated erosions. Duodenal erosions. Status post gastric biopsy. H.PYLORI gastritis   . Biopsy N/A 09/03/2012    Procedure: BIOPSY;  Surgeon: Daneil Dolin, MD;  Location: [facility];  Service: Endoscopy;  Laterality: N/A;  gastric and gastric mucosa  . Polypectomy N/A 09/03/2012    Procedure: POLYPECTOMY;  Surgeon: Daneil Dolin, MD;  Location: [facility];  Service: Endoscopy;  Laterality: N/A;  cecal polyp  . Esophagogastroduodenoscopy (egd) with propofol N/A 12/03/2012    Dr. Gala Romney: gastric diverticulum, gastric erosions and scar. Previously noted gastric ulcer completed healed. Biopsy without H.pylori.   . Biopsy N/A 12/03/2012    Procedure: BIOPSY;  Surgeon: Daneil Dolin, MD;  Location: [facility];  Service: Endoscopy;  Laterality: N/A;  . Maximum access (mas)posterior lumbar interbody fusion (plif) 1 level N/A 11/25/2013    Procedure: FOR MAXIMUM ACCESS (MAS) POSTERIOR LUMBAR INTERBODY FUSION (PLIF) 1 LEVEL;  Surgeon: Eustace Moore, MD;  Location: [place];  Service: Neurosurgery;  Laterality: N/A;  FOR MAXIMUM ACCESS (MAS) POSTERIOR LUMBAR INTERBODY FUSION (PLIF) 1 LEVEL LUMBAR 3-4    Medications:  HOME MEDS: Prior to Admission medications   Medication Sig Start Date End Date Taking? Authorizing Provider  gabapentin  (NEURONTIN) 300 MG capsule Take 1 capsule (300 mg total) by mouth 2 (two) times daily. 12/03/13  Yes Daniel J Angiulli, PA-C  glipiZIDE (GLUCOTROL) 5 MG tablet Take 5 mg by mouth 2 (two) times daily before a meal.    Yes Historical Provider, MD  lisinopril-hydrochlorothiazide (PRINZIDE,ZESTORETIC) 20-12.5 MG per tablet Take 1 tablet by mouth 2 (two) times daily. 12/03/13  Yes Daniel J Angiulli, PA-C  metFORMIN (GLUCOPHAGE) 1000 MG tablet Take 1 tablet (1,000 mg total) by mouth 2 (two) times daily with a meal. 12/03/13  Yes Daniel J Angiulli, PA-C  simvastatin (ZOCOR) 20 MG tablet TAKE ONE TABLET BY MOUTH DAILY AT 6 PM. 02/14/15  Yes Herminio Commons, MD  HYDROcodone-acetaminophen (NORCO/VICODIN) 5-325 MG tablet Take 1 tablet by mouth every 6 (six) hours as needed. Patient not taking: Reported on 06/29/2015 06/14/15   Carole Civil, MD  oxyCODONE-acetaminophen (PERCOCET/ROXICET) 5-325 MG tablet 1 tabs PO q6h prn pain Patient not taking: Reported on 07/18/2015 06/29/15   Francine Graven, DO     Allergies:  No Known Allergies  Social History:   reports that he has been smoking Cigarettes.  He has a 10 pack-year smoking history. He has never used smokeless tobacco. He reports that he does not drink alcohol or use illicit drugs.  Family History: Family History  Problem Relation Age of Onset  . Breast cancer Mother     deceased  . Cancer Mother   . Diabetes Father   . Hypertension Father   . Heart disease Father     deceased  . Hyperlipidemia Father   . Diabetes Sister   . Hypertension Sister   . Breast cancer Sister   . Colon cancer Neg Hx   . Liver disease Neg Hx      Physical Exam: Filed Vitals:   07/18/15 1831 07/18/15 1900 07/18/15 2048 07/19/15 0646  BP: 160/81  146/82 143/79  Pulse: 107 100 94 74  Temp: 98.4 F (36.9 C) 98.6 F (37 C) 98 F (36.7 C) 98.3 F (36.8 C)  TempSrc: Oral Oral Oral Oral  Resp: 18 22 21 20   Height: 6\' 1"  (1.854 m)     Weight: 90.674 kg (199  lb 14.4 oz)     SpO2: 100% 100% 97% 100%   Blood pressure 143/79, pulse 74, temperature 98.3 F (36.8 C), temperature source Oral, resp. rate 20, height 6\' 1"  (1.854 m), weight 90.674 kg (199 lb 14.4 oz), SpO2 100 %.  GEN:  Pleasant  patient lying in the stretcher in no acute distress; cooperative with exam. PSYCH:  alert and oriented x4; does not appear anxious or depressed; affect is appropriate. HEENT: Mucous membranes pink and anicteric; PERRLA; EOM intact; no cervical lymphadenopathy nor thyromegaly or carotid bruit; no JVD; There were no stridor. Neck is very supple. Breasts:: Not examined CHEST WALL: No tenderness CHEST: Normal respiration, clear to auscultation bilaterally.  HEART: Regular rate and rhythm.  There are no murmur, rub, or gallops.   BACK: No kyphosis or scoliosis; no CVA tenderness ABDOMEN: soft and non-tender; no masses, no organomegaly, normal abdominal bowel sounds; no pannus; no intertriginous candida. There is no rebound and no distention. Rectal Exam: Not done EXTREMITIES: No bone or joint deformity; age-appropriate arthropathy of the hands and knees; no edema; no ulcerations.  There is no calf tenderness. Dry ischemic and necrosis of the toes, especially the  4th toe.  Genitalia: not examined PULSES: 2+ and symmetric SKIN: Normal hydration no rash or ulceration CNS: Cranial nerves 2-12 grossly intact no focal lateralizing neurologic deficit.  Speech is fluent; uvula elevated with phonation, facial symmetry and tongue midline. DTR are normal bilaterally, cerebella exam is intact, barbinski is negative and strengths are equaled bilaterally.  No sensory loss.   Labs on Admission:  Basic Metabolic Panel:  Recent Labs Lab 07/18/15 1330 07/19/15 0625  NA 134* 135  K 4.6 4.3  CL 102 102  CO2 26 26  GLUCOSE 318* 152*  BUN 29* 19  CREATININE 1.43* 1.32*  CALCIUM 9.4 9.0   Liver Function Tests:  Recent Labs Lab 07/18/15 1330  AST 18  ALT 14*  ALKPHOS 51   BILITOT 0.3  PROT 8.8*  ALBUMIN 3.6   CBC:  Recent Labs Lab 07/18/15 1330 07/19/15 0625  WBC 13.0* 12.5*  NEUTROABS 10.3*  --   HGB 7.7* 9.2*  HCT 24.9* 28.3*  MCV 87.4 87.3  PLT 414* 370   CBG:  Recent Labs Lab 07/18/15 1555 07/18/15 1729 07/18/15 1911 07/18/15 2056 07/19/15 0743  GLUCAP 207* 82 252* 256* 157*     Radiological Exams on Admission: Dg Foot Complete Left  07/18/2015  CLINICAL DATA:  Left foot pain with ulcer along the great toe and fourth toe for 2 weeks. Diabetic. EXAM: LEFT FOOT - COMPLETE 3+ VIEW COMPARISON:  06/29/2015 prior exams FINDINGS: There is no evidence of acute fracture, subluxation or dislocation. No radiographic evidence of osteomyelitis identified. No radiopaque foreign bodies are present. Vascular calcifications are again noted. IMPRESSION: No acute bony abnormalities. Electronically Signed   By: Margarette Canada M.D.   On: 07/18/2015 14:21   Assessment/Plan Present on Admission:  . Cellulitis L foot . Diabetes mellitus with foot ulcer, without long-term current use of insulin ([place]) . Tobacco abuse . Melena . Normocytic anemia . Cellulitis of foot  PLAN:  Left cellulitis:  Will continue with IV Vancomycin.  Will need to obtain ABI, and consult with surgery.    DM:  CBGs are better.  Will continue with SSI.   Anemia:  Will need GI work up at some point.  Will consult GI for setting thing up, though he is clearly not actively bleeding.   Tobacco abuse:  Extremely important that he quits.   Other plans as per orders. Code Status: FULL Haskel Khan, MD.  FACP [facility] Pager [phone] 7pm to 7am.  07/19/2015, 11:40 AM

## 2015-07-20 DIAGNOSIS — Z8711 Personal history of peptic ulcer disease: Secondary | ICD-10-CM | POA: Diagnosis not present

## 2015-07-20 DIAGNOSIS — J45909 Unspecified asthma, uncomplicated: Secondary | ICD-10-CM | POA: Diagnosis present

## 2015-07-20 DIAGNOSIS — Z833 Family history of diabetes mellitus: Secondary | ICD-10-CM | POA: Diagnosis not present

## 2015-07-20 DIAGNOSIS — Z8673 Personal history of transient ischemic attack (TIA), and cerebral infarction without residual deficits: Secondary | ICD-10-CM | POA: Diagnosis not present

## 2015-07-20 DIAGNOSIS — I10 Essential (primary) hypertension: Secondary | ICD-10-CM | POA: Diagnosis not present

## 2015-07-20 DIAGNOSIS — E11621 Type 2 diabetes mellitus with foot ulcer: Secondary | ICD-10-CM | POA: Diagnosis present

## 2015-07-20 DIAGNOSIS — Z981 Arthrodesis status: Secondary | ICD-10-CM | POA: Diagnosis not present

## 2015-07-20 DIAGNOSIS — E1140 Type 2 diabetes mellitus with diabetic neuropathy, unspecified: Secondary | ICD-10-CM | POA: Diagnosis present

## 2015-07-20 DIAGNOSIS — Z803 Family history of malignant neoplasm of breast: Secondary | ICD-10-CM | POA: Diagnosis not present

## 2015-07-20 DIAGNOSIS — I739 Peripheral vascular disease, unspecified: Secondary | ICD-10-CM | POA: Diagnosis present

## 2015-07-20 DIAGNOSIS — Z7984 Long term (current) use of oral hypoglycemic drugs: Secondary | ICD-10-CM | POA: Diagnosis not present

## 2015-07-20 DIAGNOSIS — E1152 Type 2 diabetes mellitus with diabetic peripheral angiopathy with gangrene: Secondary | ICD-10-CM | POA: Diagnosis present

## 2015-07-20 DIAGNOSIS — N183 Chronic kidney disease, stage 3 (moderate): Secondary | ICD-10-CM | POA: Diagnosis present

## 2015-07-20 DIAGNOSIS — M79672 Pain in left foot: Secondary | ICD-10-CM | POA: Diagnosis present

## 2015-07-20 DIAGNOSIS — E785 Hyperlipidemia, unspecified: Secondary | ICD-10-CM | POA: Diagnosis present

## 2015-07-20 DIAGNOSIS — K921 Melena: Secondary | ICD-10-CM | POA: Diagnosis present

## 2015-07-20 DIAGNOSIS — B1920 Unspecified viral hepatitis C without hepatic coma: Secondary | ICD-10-CM | POA: Diagnosis present

## 2015-07-20 DIAGNOSIS — D62 Acute posthemorrhagic anemia: Secondary | ICD-10-CM | POA: Diagnosis present

## 2015-07-20 DIAGNOSIS — L03119 Cellulitis of unspecified part of limb: Secondary | ICD-10-CM | POA: Diagnosis not present

## 2015-07-20 DIAGNOSIS — E1122 Type 2 diabetes mellitus with diabetic chronic kidney disease: Secondary | ICD-10-CM | POA: Diagnosis present

## 2015-07-20 DIAGNOSIS — E1165 Type 2 diabetes mellitus with hyperglycemia: Secondary | ICD-10-CM | POA: Diagnosis present

## 2015-07-20 DIAGNOSIS — I129 Hypertensive chronic kidney disease with stage 1 through stage 4 chronic kidney disease, or unspecified chronic kidney disease: Secondary | ICD-10-CM | POA: Diagnosis present

## 2015-07-20 DIAGNOSIS — L97529 Non-pressure chronic ulcer of other part of left foot with unspecified severity: Secondary | ICD-10-CM | POA: Diagnosis present

## 2015-07-20 DIAGNOSIS — F17210 Nicotine dependence, cigarettes, uncomplicated: Secondary | ICD-10-CM | POA: Diagnosis present

## 2015-07-20 DIAGNOSIS — Z8249 Family history of ischemic heart disease and other diseases of the circulatory system: Secondary | ICD-10-CM | POA: Diagnosis not present

## 2015-07-20 DIAGNOSIS — L03116 Cellulitis of left lower limb: Secondary | ICD-10-CM | POA: Diagnosis present

## 2015-07-20 LAB — GLUCOSE, CAPILLARY
GLUCOSE-CAPILLARY: 160 mg/dL — AB (ref 65–99)
GLUCOSE-CAPILLARY: 98 mg/dL (ref 65–99)
Glucose-Capillary: 45 mg/dL — ABNORMAL LOW (ref 65–99)

## 2015-07-20 MED ORDER — ASPIRIN EC 81 MG PO TBEC: 81.0000 mg | DELAYED_RELEASE_TABLET | Freq: Every day | ORAL | Status: DC

## 2015-07-20 MED ORDER — DOXYCYCLINE HYCLATE 50 MG PO CAPS: 50.0000 mg | ORAL_CAPSULE | Freq: Two times a day (BID) | ORAL | Status: DC

## 2015-07-20 NOTE — Care Management Note (Signed)
Case Management Note  Patient Details  Name: LUCKIE OKABE MRN: [id number] Date of Birth: 1951-01-06  Subjective/Objective:    Spoke with patient for discharge planning. Patient from home alone and uses a walker. Patient alert and oriented sitting in chair, stated that he thought he was too weak to go home. However, he was changing positions frequently without difficulty. Patient arose from bedside chair quickly to answer phone with out any difficulty or balance issues. No dyspnea noted.Explained to patient criteria for staying inpatient in hospital and that he had been deemed by his physician to be medically stable for discharge.  Follow up with his PCP after discharge.  Patient stated that he has family support in area and that he has a ride home today.  Patient is insured with Medicaid [place].              Action/Plan: Home with self care.    Expected Discharge Date:                  Expected Discharge Plan:  Home/Self Care  In-House Referral:     Discharge planning Services  CM Consult  Post Acute Care Choice:    Choice offered to:     DME Arranged:    DME Agency:     HH Arranged:    [place] Agency:     Status of Service:  Completed, signed off  Medicare Important Message Given:  N/A - LOS <3 / Initial given by admissions Date Medicare IM Given:    Medicare IM give by:    Date Additional Medicare IM Given:    Additional Medicare Important Message give by:     If discussed at [place] of Stay Meetings, dates discussed:    Additional Comments:  Alvie Heidelberg, RN 07/20/2015, 2:40 PM

## 2015-07-20 NOTE — Discharge Summary (Signed)
Physician Discharge Summary  Cory Weber [id number] DOB: March 26, 1950 DOA: 07/18/2015  PCP: Maggie Font, MD  Admit date: 07/18/2015 Discharge date: 07/20/2015  Admitted From: Home Disposition:  Home.   Recommendations for Outpatient Follow-up:  1. Follow up with PCP in 1-2 weeks 2. Follow up with Dr Vallarie Mare of vascular surgery as soon as possible.    Discharge Condition: improved.  CODE STATUS: FULL CODE.  Diet recommendation: Heart healthy, carb modified diet.   Patient was admitted by Dr Melvia Heaps on July 18, 2015 for foot ulcer and anemia.  As per his H and P:  " HPI: Cory Weber is a 65 y.o. male with hx DM2, HTN, hep C and asthma came to ED for L foot and great toe pain w ulcer. +chills, no fevers. He soaked his foot in hot water but due to neuropathy he didn't know how hot is was and ended up with blisters on his great toe. In ED pt rec'd IV abx and patient says "the blisters on it have already gone down". Also patient Hb is low at 7.4 and ED MD did rectal exam which showed black tarry stool guiac +. Asked to see for admission.   Patient has been taking "tons" of 800 mg Advil over the past two weeks because of foot and toe pain. NO hx of PUD or GIB in the past.    Pt was here in 2012 with acute L thalamus stroke, R sided numbness which improved. +cocaine and tobacco use. In 2015 has back surgery for disc rupture and had rehab at [facility]. Has been on disabliity since. He lives alone, smokes cigarettes, no etoh. Has one daughter in her 83's, lives in [place]. Worked in Architect prior to disability. Grew up and lives in [place]."  HOSPITAL COURSE:  Patient was admitted into the hospital, and he was given IV Vancomycin for his toe ulcer and cellulitis.  It was clear that he has arterial insufficiency, and an arterial sonogram was ordered, with ABI of 0.4.  Vascular surgery was consulted, and Dr Rosana Hoes saw him, and recommended that he be transferred to [facility] for further  evaluation, as it is suspicious for PVD especially in the popliteal area.  He was started on ASA as well.  For his anemia, he was given PRBCs, and watched, and there was no further evidence of bleeding.  He was told that he will need to follow up with GI for endoscopic evaluation to exclude malignancy.  I spoke with Dr Vallarie Mare of vascular surgery at [facility], and he felt that it is safe for patient to be discharged and followed up in his office.  He was strongly advised to stop smoking, and take his ASA daily. He requested narcotics, however, he was advised to seek refills from his PCP.  He is felt to be stable for discharge, and will be discharge on Doxycycline 100 BID for 5 days.  He will see his PCP in one week, and appointment was made for him to see vascular surgeon for his PVD.  Subsequently, he can see Dr Rosana Hoes for follow up.  Thank you and Good Day.    Discharge Diagnoses:  Principal Problem:   Cellulitis L foot Active Problems:   Tobacco abuse   Normocytic anemia   Melena   S/P lumbar spinal fusion   Diabetes mellitus with foot ulcer, without long-term current use of insulin (HCC)   Chronic kidney disease, stage 3   Essential hypertension   Cellulitis of foot  Discharge Instructions:  Follow up as discussed,  PCP, Vascular surgery, and GI.      Medication List    STOP taking these medications        oxyCODONE-acetaminophen 5-325 MG tablet  Commonly known as:  PERCOCET/ROXICET      TAKE these medications        aspirin EC 81 MG tablet  Take 1 tablet (81 mg total) by mouth daily.     doxycycline 50 MG capsule  Commonly known as:  VIBRAMYCIN  Take 1 capsule (50 mg total) by mouth 2 (two) times daily.     gabapentin 300 MG capsule  Commonly known as:  NEURONTIN  Take 1 capsule (300 mg total) by mouth 2 (two) times daily.     glipiZIDE 5 MG tablet  Commonly known as:  GLUCOTROL  Take 5 mg by mouth 2 (two) times daily before a meal.     HYDROcodone-acetaminophen 5-325 MG  tablet  Commonly known as:  NORCO/VICODIN  Take 1 tablet by mouth every 6 (six) hours as needed.     lisinopril-hydrochlorothiazide 20-12.5 MG tablet  Commonly known as:  PRINZIDE,ZESTORETIC  Take 1 tablet by mouth 2 (two) times daily.     metFORMIN 1000 MG tablet  Commonly known as:  GLUCOPHAGE  Take 1 tablet (1,000 mg total) by mouth 2 (two) times daily with a meal.     simvastatin 20 MG tablet  Commonly known as:  ZOCOR  TAKE ONE TABLET BY MOUTH DAILY AT 6 PM.        No Known Allergies  Consultations: Dr Rosana Hoes of vascular surgery.   Procedures/Studies: Dg Ankle Complete Left  July 26, 2015  CLINICAL DATA:  Dropped frozen chicken on top of foot several weeks ago. EXAM: LEFT ANKLE COMPLETE - 3+ VIEW COMPARISON:  None. FINDINGS: There is no evidence of fracture, dislocation, or joint effusion. There is no evidence of arthropathy or other focal bone abnormality. Vascular calcifications is noted. IMPRESSION: 1. No acute bone abnormality. Electronically Signed   By: Kerby Moors M.D.   On: 07/26/15 16:08   US Venous Img Lower Unilateral Left  07-26-15  CLINICAL DATA:  Left lower extremity pain and swelling x4 weeks post trauma. EXAM: LEFT LOWER EXTREMITY VENOUS DOPPLER ULTRASOUND TECHNIQUE: Gray-scale sonography with compression, as well as color and duplex ultrasound, were performed to evaluate the deep venous system from the level of the common femoral vein through the popliteal and proximal calf veins. COMPARISON:  None FINDINGS: Normal compressibility of the common femoral, superficial femoral, and popliteal veins, as well as the proximal calf veins. No filling defects to suggest DVT on grayscale or color Doppler imaging. Doppler waveforms show normal direction of venous flow, normal respiratory phasicity and response to augmentation. Survey views of the contralateral common femoral vein are unremarkable. IMPRESSION: No evidence of LEFT lower extremity deep vein thrombosis.  Electronically Signed   By: Lucrezia Europe M.D.   On: 2015/07/26 15:59   US Arterial Seg Multiple  07/19/2015  CLINICAL DATA:  65 year old male with left foot cellulitis and pain. EXAM: NONINVASIVE PHYSIOLOGIC VASCULAR STUDY OF BILATERAL LOWER EXTREMITIES TECHNIQUE: Evaluation of both lower extremities was performed at rest, including calculation of ankle-brachial indices, multiple segmental pressure evaluation, segmental Doppler and segmental pulse volume recording. COMPARISON:  None. FINDINGS: Right ABI:  0.91 Left ABI:  0.45 Right Lower Extremity: Significant pressure differential between the low thigh and calf cuffs suggesting distal femoral popliteal disease. Preserved digital waveforms on PVRs. Left Lower Extremity: Significant  pressure gradient between the low thigh cuff and calf cuff consistent with distal femoral popliteal disease. Additionally, there are significant pressure gradients between the calf and ankle consistent with runoff disease. IMPRESSION: 1. Resting right ankle brachial index of 0.91 consistent with at least mild peripheral arterial disease. Segmental pressures, arterial waveforms and PVRs suggest femoral popliteal disease. 2. Resting left ankle brachial index of 0.45 consistent with advanced peripheral arterial disease. Segmental pressures, arterial waveforms and PVRs suggest both femoral popliteal disease and runoff disease. Given clinical history of left foot wound and rest pain these findings are concerning for critical limb ischemia (CLI). Recommend referral to vascular Interventional Radiology or other endovascular specialist for further evaluation and management. Signed, Criselda Peaches, MD [facility] Electronically Signed   By: Jacqulynn Cadet M.D.   On: 07/19/2015 16:22   Dg Foot Complete Left  07/18/2015  CLINICAL DATA:  Left foot pain with ulcer along the great toe and fourth toe for 2 weeks. Diabetic. EXAM: LEFT  FOOT - COMPLETE 3+ VIEW COMPARISON:  06/29/2015 prior exams FINDINGS: There is no evidence of acute fracture, subluxation or dislocation. No radiographic evidence of osteomyelitis identified. No radiopaque foreign bodies are present. Vascular calcifications are again noted. IMPRESSION: No acute bony abnormalities. Electronically Signed   By: Margarette Canada M.D.   On: 07/18/2015 14:21   Dg Foot Complete Left  06/29/2015  CLINICAL DATA:  Dropped frozen chicken on top of foot. Pain to top of foot. EXAM: LEFT FOOT - COMPLETE 3+ VIEW COMPARISON:  None. FINDINGS: There is no evidence of fracture or dislocation. There is no evidence of arthropathy or other focal bone abnormality. Soft tissues are unremarkable. IMPRESSION: No fracture identified. Electronically Signed   By: Kerby Moors M.D.   On: 06/29/2015 16:11    Subjective:   Discharge Exam: Filed Vitals:   07/19/15 2104 07/20/15 0648  BP: 148/70 158/86  Pulse: 109 102  Temp: 99.5 F (37.5 C) 98.9 F (37.2 C)  Resp: 20 20   Filed Vitals:   07/19/15 0646 07/19/15 1400 07/19/15 2104 07/20/15 0648  BP: 143/79 151/85 148/70 158/86  Pulse: 74 80 109 102  Temp: 98.3 F (36.8 C) 98.1 F (36.7 C) 99.5 F (37.5 C) 98.9 F (37.2 C)  TempSrc: Oral  Oral Oral  Resp: 20 20 20 20   Height:      Weight:      SpO2: 100% 100% 100% 100%    General: Pt is alert, awake, not in acute distress Cardiovascular: RRR, S1/S2 +, no rubs, no gallops Respiratory: CTA bilaterally, no wheezing, no rhonchi Abdominal: Soft, NT, ND, bowel sounds + Extremities: no edema, no cyanosis    The results of significant diagnostics from this hospitalization (including imaging, microbiology, ancillary and laboratory) are listed below for reference.     Microbiology: Recent Results (from the past 240 hour(s))  Culture, blood (routine x 2)     Status: None (Preliminary result)   Collection Time: 07/18/15  1:30 PM  Result Value Ref Range Status   Specimen Description  BLOOD LEFT ANTECUBITAL  Final   Special Requests BOTTLES DRAWN AEROBIC AND ANAEROBIC 10CC EACH  Final   Culture NO GROWTH 2 DAYS  Final   Report Status PENDING  Incomplete  Culture, blood (routine x 2)     Status: None (Preliminary result)   Collection Time: 07/18/15  1:35 PM  Result Value Ref Range Status   Specimen Description BLOOD LEFT ARM DRAWN BY RN  Final   Special Requests BOTTLES DRAWN AEROBIC AND ANAEROBIC 5CC EACH  Final   Culture NO GROWTH 2 DAYS  Final   Report Status PENDING  Incomplete     Basic Metabolic Panel:  Recent Labs Lab 07/18/15 1330 07/19/15 0625  NA 134* 135  K 4.6 4.3  CL 102 102  CO2 26 26  GLUCOSE 318* 152*  BUN 29* 19  CREATININE 1.43* 1.32*  CALCIUM 9.4 9.0   Liver Function Tests:  Recent Labs Lab 07/18/15 1330  AST 18  ALT 14*  ALKPHOS 51  BILITOT 0.3  PROT 8.8*  ALBUMIN 3.6   CBC:  Recent Labs Lab 07/18/15 1330 07/19/15 0625  WBC 13.0* 12.5*  NEUTROABS 10.3*  --   HGB 7.7* 9.2*  HCT 24.9* 28.3*  MCV 87.4 87.3  PLT 414* 370   CBG:  Recent Labs Lab 07/19/15 1205 07/19/15 1623 07/19/15 2103 07/20/15 0823 07/20/15 1137  GLUCAP 79 130* 233* 160* 45*   Urinalysis    Component Value Date/Time   COLORURINE YELLOW 02/06/2015 1301   APPEARANCEUR HAZY* 02/06/2015 1301   LABSPEC >1.030* 02/06/2015 1301   PHURINE 5.5 02/06/2015 1301   GLUCOSEU 500* 02/06/2015 1301   HGBUR TRACE* 02/06/2015 1301   BILIRUBINUR NEGATIVE 02/06/2015 1301   KETONESUR NEGATIVE 02/06/2015 1301   PROTEINUR NEGATIVE 02/06/2015 1301   UROBILINOGEN 1.0 11/29/2013 1705   NITRITE NEGATIVE 02/06/2015 1301   LEUKOCYTESUR TRACE* 02/06/2015 1301   Microbiology Recent Results (from the past 240 hour(s))  Culture, blood (routine x 2)     Status: None (Preliminary result)   Collection Time: 07/18/15  1:30 PM  Result Value Ref Range Status   Specimen Description BLOOD LEFT ANTECUBITAL  Final   Special Requests BOTTLES DRAWN AEROBIC AND ANAEROBIC 10CC  EACH  Final   Culture NO GROWTH 2 DAYS  Final   Report Status PENDING  Incomplete  Culture, blood (routine x 2)     Status: None (Preliminary result)   Collection Time: 07/18/15  1:35 PM  Result Value Ref Range Status   Specimen Description BLOOD LEFT ARM DRAWN BY RN  Final   Special Requests BOTTLES DRAWN AEROBIC AND ANAEROBIC 5CC EACH  Final   Culture NO GROWTH 2 DAYS  Final   Report Status PENDING  Incomplete     Time coordinating discharge: Over 30 minutes  SIGNED:   Orvan Falconer, MD FACP.   [facility] 07/20/2015, 1:04 PM Pager   If 7PM-7AM, please contact night-coverage [email or web address] Password [email or web address]

## 2015-07-20 NOTE — Progress Notes (Signed)
Results for BEKA, MIKULAK (MRN [id number]) as of 07/20/2015 12:53  Ref. Range 07/19/2015 16:23 07/19/2015 21:03 07/20/2015 08:23 07/20/2015 11:37  Glucose-Capillary Latest Ref Range: 65-99 mg/dL 130 (H) 233 (H) 160 (H) 45 (L)  Noted that blood sugar was 45 mg/dl at 11:30 AM.  Recommend stopping oral agents while in the hospital.  Continue Novolog correction scale MODERATE TID & HS.  Harvel Ricks RN BSN CDE

## 2015-07-20 NOTE — Care Management Important Message (Signed)
Important Message  Patient Details  Name: Cory Weber MRN: [id number] Date of Birth: Feb 27, 1950   Medicare Important Message Given:  N/A - LOS <3 / Initial given by admissions    Alvie Heidelberg, RN 07/20/2015, 2:13 PM

## 2015-07-23 ENCOUNTER — Encounter (HOSPITAL_COMMUNITY): Payer: Self-pay | Admitting: *Deleted

## 2015-07-23 ENCOUNTER — Inpatient Hospital Stay (HOSPITAL_COMMUNITY)
Admission: EM | Admit: 2015-07-23 | Discharge: 2015-08-10 | DRG: 252 | Disposition: A | Discharge status: Home / Self Care | Payer: Medicaid Other | Attending: Internal Medicine | Admitting: Internal Medicine

## 2015-07-23 DIAGNOSIS — L03116 Cellulitis of left lower limb: Secondary | ICD-10-CM | POA: Diagnosis present

## 2015-07-23 DIAGNOSIS — L089 Local infection of the skin and subcutaneous tissue, unspecified: Secondary | ICD-10-CM | POA: Diagnosis present

## 2015-07-23 DIAGNOSIS — Z8673 Personal history of transient ischemic attack (TIA), and cerebral infarction without residual deficits: Secondary | ICD-10-CM | POA: Diagnosis not present

## 2015-07-23 DIAGNOSIS — I10 Essential (primary) hypertension: Secondary | ICD-10-CM | POA: Diagnosis present

## 2015-07-23 DIAGNOSIS — Z792 Long term (current) use of antibiotics: Secondary | ICD-10-CM

## 2015-07-23 DIAGNOSIS — D638 Anemia in other chronic diseases classified elsewhere: Secondary | ICD-10-CM | POA: Diagnosis present

## 2015-07-23 DIAGNOSIS — E11621 Type 2 diabetes mellitus with foot ulcer: Secondary | ICD-10-CM | POA: Diagnosis present

## 2015-07-23 DIAGNOSIS — I129 Hypertensive chronic kidney disease with stage 1 through stage 4 chronic kidney disease, or unspecified chronic kidney disease: Secondary | ICD-10-CM | POA: Diagnosis present

## 2015-07-23 DIAGNOSIS — L97529 Non-pressure chronic ulcer of other part of left foot with unspecified severity: Secondary | ICD-10-CM | POA: Diagnosis present

## 2015-07-23 DIAGNOSIS — F17210 Nicotine dependence, cigarettes, uncomplicated: Secondary | ICD-10-CM | POA: Diagnosis present

## 2015-07-23 DIAGNOSIS — E1152 Type 2 diabetes mellitus with diabetic peripheral angiopathy with gangrene: Secondary | ICD-10-CM | POA: Diagnosis present

## 2015-07-23 DIAGNOSIS — R339 Retention of urine, unspecified: Secondary | ICD-10-CM | POA: Diagnosis not present

## 2015-07-23 DIAGNOSIS — Z79899 Other long term (current) drug therapy: Secondary | ICD-10-CM | POA: Diagnosis not present

## 2015-07-23 DIAGNOSIS — Y846 Urinary catheterization as the cause of abnormal reaction of the patient, or of later complication, without mention of misadventure at the time of the procedure: Secondary | ICD-10-CM | POA: Diagnosis not present

## 2015-07-23 DIAGNOSIS — M79672 Pain in left foot: Secondary | ICD-10-CM | POA: Diagnosis present

## 2015-07-23 DIAGNOSIS — R000 Tachycardia, unspecified: Secondary | ICD-10-CM | POA: Diagnosis present

## 2015-07-23 DIAGNOSIS — E11628 Type 2 diabetes mellitus with other skin complications: Secondary | ICD-10-CM | POA: Diagnosis present

## 2015-07-23 DIAGNOSIS — N170 Acute kidney failure with tubular necrosis: Secondary | ICD-10-CM | POA: Diagnosis present

## 2015-07-23 DIAGNOSIS — Z803 Family history of malignant neoplasm of breast: Secondary | ICD-10-CM

## 2015-07-23 DIAGNOSIS — Z7984 Long term (current) use of oral hypoglycemic drugs: Secondary | ICD-10-CM | POA: Diagnosis not present

## 2015-07-23 DIAGNOSIS — Z7982 Long term (current) use of aspirin: Secondary | ICD-10-CM | POA: Diagnosis not present

## 2015-07-23 DIAGNOSIS — D649 Anemia, unspecified: Secondary | ICD-10-CM | POA: Diagnosis present

## 2015-07-23 DIAGNOSIS — I70244 Atherosclerosis of native arteries of left leg with ulceration of heel and midfoot: Secondary | ICD-10-CM | POA: Insufficient documentation

## 2015-07-23 DIAGNOSIS — I70248 Atherosclerosis of native arteries of left leg with ulceration of other part of lower left leg: Secondary | ICD-10-CM | POA: Diagnosis not present

## 2015-07-23 DIAGNOSIS — K7460 Unspecified cirrhosis of liver: Secondary | ICD-10-CM | POA: Diagnosis present

## 2015-07-23 DIAGNOSIS — Z8619 Personal history of other infectious and parasitic diseases: Secondary | ICD-10-CM | POA: Diagnosis not present

## 2015-07-23 DIAGNOSIS — F17200 Nicotine dependence, unspecified, uncomplicated: Secondary | ICD-10-CM | POA: Diagnosis present

## 2015-07-23 DIAGNOSIS — G9340 Encephalopathy, unspecified: Secondary | ICD-10-CM | POA: Diagnosis not present

## 2015-07-23 DIAGNOSIS — N179 Acute kidney failure, unspecified: Secondary | ICD-10-CM

## 2015-07-23 DIAGNOSIS — E1140 Type 2 diabetes mellitus with diabetic neuropathy, unspecified: Secondary | ICD-10-CM | POA: Diagnosis present

## 2015-07-23 DIAGNOSIS — L03032 Cellulitis of left toe: Secondary | ICD-10-CM | POA: Diagnosis not present

## 2015-07-23 DIAGNOSIS — E1122 Type 2 diabetes mellitus with diabetic chronic kidney disease: Secondary | ICD-10-CM | POA: Diagnosis present

## 2015-07-23 DIAGNOSIS — T8383XA Hemorrhage of genitourinary prosthetic devices, implants and grafts, initial encounter: Secondary | ICD-10-CM | POA: Diagnosis not present

## 2015-07-23 DIAGNOSIS — L03119 Cellulitis of unspecified part of limb: Secondary | ICD-10-CM | POA: Diagnosis not present

## 2015-07-23 DIAGNOSIS — N183 Chronic kidney disease, stage 3 (moderate): Secondary | ICD-10-CM | POA: Diagnosis present

## 2015-07-23 DIAGNOSIS — E785 Hyperlipidemia, unspecified: Secondary | ICD-10-CM | POA: Diagnosis present

## 2015-07-23 DIAGNOSIS — I493 Ventricular premature depolarization: Secondary | ICD-10-CM | POA: Diagnosis not present

## 2015-07-23 DIAGNOSIS — L0390 Cellulitis, unspecified: Secondary | ICD-10-CM | POA: Diagnosis present

## 2015-07-23 DIAGNOSIS — Z01810 Encounter for preprocedural cardiovascular examination: Secondary | ICD-10-CM | POA: Diagnosis not present

## 2015-07-23 DIAGNOSIS — I739 Peripheral vascular disease, unspecified: Secondary | ICD-10-CM | POA: Diagnosis present

## 2015-07-23 DIAGNOSIS — N320 Bladder-neck obstruction: Secondary | ICD-10-CM | POA: Clinically undetermined

## 2015-07-23 DIAGNOSIS — E1159 Type 2 diabetes mellitus with other circulatory complications: Secondary | ICD-10-CM | POA: Diagnosis not present

## 2015-07-23 DIAGNOSIS — E1165 Type 2 diabetes mellitus with hyperglycemia: Secondary | ICD-10-CM | POA: Diagnosis present

## 2015-07-23 DIAGNOSIS — E7800 Pure hypercholesterolemia, unspecified: Secondary | ICD-10-CM | POA: Diagnosis present

## 2015-07-23 DIAGNOSIS — I7092 Chronic total occlusion of artery of the extremities: Secondary | ICD-10-CM | POA: Diagnosis present

## 2015-07-23 DIAGNOSIS — Z833 Family history of diabetes mellitus: Secondary | ICD-10-CM

## 2015-07-23 DIAGNOSIS — Z8249 Family history of ischemic heart disease and other diseases of the circulatory system: Secondary | ICD-10-CM | POA: Diagnosis not present

## 2015-07-23 HISTORY — DX: Cutaneous abscess of unspecified foot: L02.619

## 2015-07-23 HISTORY — DX: Cellulitis of unspecified part of limb: L03.119

## 2015-07-23 LAB — BASIC METABOLIC PANEL WITH GFR
Anion gap: 10 (ref 5–15)
BUN: 21 mg/dL — ABNORMAL HIGH (ref 6–20)
CO2: 27 mmol/L (ref 22–32)
Calcium: 8.9 mg/dL (ref 8.9–10.3)
Chloride: 94 mmol/L — ABNORMAL LOW (ref 101–111)
Creatinine, Ser: 1.52 mg/dL — ABNORMAL HIGH (ref 0.61–1.24)
GFR calc Af Amer: 55 mL/min — ABNORMAL LOW
GFR calc non Af Amer: 47 mL/min — ABNORMAL LOW
Glucose, Bld: 172 mg/dL — ABNORMAL HIGH (ref 65–99)
Potassium: 3.7 mmol/L (ref 3.5–5.1)
Sodium: 131 mmol/L — ABNORMAL LOW (ref 135–145)

## 2015-07-23 LAB — CULTURE, BLOOD (ROUTINE X 2)
CULTURE: NO GROWTH
Culture: NO GROWTH

## 2015-07-23 LAB — CBC WITH DIFFERENTIAL/PLATELET
BASOS ABS: 0 10*3/uL (ref 0.0–0.1)
BASOS PCT: 0 %
EOS PCT: 0 %
Eosinophils Absolute: 0.1 10*3/uL (ref 0.0–0.7)
HCT: 26.8 % — ABNORMAL LOW (ref 39.0–52.0)
Hemoglobin: 8.5 g/dL — ABNORMAL LOW (ref 13.0–17.0)
LYMPHS PCT: 9 %
Lymphs Abs: 1.5 10*3/uL (ref 0.7–4.0)
MCH: 27.7 pg (ref 26.0–34.0)
MCHC: 31.7 g/dL (ref 30.0–36.0)
MCV: 87.3 fL (ref 78.0–100.0)
MONO ABS: 1 10*3/uL (ref 0.1–1.0)
Monocytes Relative: 6 %
Neutro Abs: 13.6 10*3/uL — ABNORMAL HIGH (ref 1.7–7.7)
Neutrophils Relative %: 85 %
PLATELETS: 326 10*3/uL (ref 150–400)
RBC: 3.07 MIL/uL — AB (ref 4.22–5.81)
RDW: 14.1 % (ref 11.5–15.5)
WBC: 16.1 10*3/uL — AB (ref 4.0–10.5)

## 2015-07-23 LAB — TYPE AND SCREEN
ABO/RH(D): O POS
Antibody Screen: NEGATIVE

## 2015-07-23 LAB — GLUCOSE, CAPILLARY: GLUCOSE-CAPILLARY: 211 mg/dL — AB (ref 65–99)

## 2015-07-23 LAB — I-STAT CG4 LACTIC ACID, ED: Lactic Acid, Venous: 1.24 mmol/L (ref 0.5–2.0)

## 2015-07-23 MED ORDER — PIPERACILLIN-TAZOBACTAM 3.375 G IVPB
3.3750 g | Freq: Once | INTRAVENOUS | Status: AC
  Administered 2015-07-23: 3.375 g via INTRAVENOUS
  Filled 2015-07-23: qty 50

## 2015-07-23 MED ORDER — ACETAMINOPHEN 325 MG PO TABS
650.0000 mg | ORAL_TABLET | Freq: Four times a day (QID) | ORAL | Status: DC | PRN
  Administered 2015-07-23 – 2015-07-30 (×6): 650 mg via ORAL
  Filled 2015-07-23 (×6): qty 2

## 2015-07-23 MED ORDER — PIPERACILLIN-TAZOBACTAM 3.375 G IVPB
3.3750 g | Freq: Three times a day (TID) | INTRAVENOUS | Status: DC
  Administered 2015-07-24 – 2015-07-27 (×9): 3.375 g via INTRAVENOUS
  Filled 2015-07-23 (×13): qty 50

## 2015-07-23 MED ORDER — ONDANSETRON HCL 4 MG PO TABS: 4.0000 mg | ORAL_TABLET | Freq: Four times a day (QID) | ORAL | Status: DC | PRN

## 2015-07-23 MED ORDER — SODIUM CHLORIDE 0.9 % IV BOLUS (SEPSIS)
500.0000 mL | Freq: Once | INTRAVENOUS | Status: AC
  Administered 2015-07-23: 500 mL via INTRAVENOUS

## 2015-07-23 MED ORDER — VANCOMYCIN HCL IN DEXTROSE 1-5 GM/200ML-% IV SOLN
1000.0000 mg | INTRAVENOUS | Status: AC
  Administered 2015-07-23: 1000 mg via INTRAVENOUS
  Filled 2015-07-23: qty 200

## 2015-07-23 MED ORDER — ACETAMINOPHEN 650 MG RE SUPP: 650.0000 mg | Freq: Four times a day (QID) | RECTAL | Status: DC | PRN

## 2015-07-23 MED ORDER — MORPHINE SULFATE (PF) 2 MG/ML IV SOLN
2.0000 mg | INTRAVENOUS | Status: DC | PRN
  Administered 2015-07-23: 2 mg via INTRAVENOUS
  Filled 2015-07-23: qty 1

## 2015-07-23 MED ORDER — GLIPIZIDE 5 MG PO TABS
5.0000 mg | ORAL_TABLET | Freq: Two times a day (BID) | ORAL | Status: DC
  Administered 2015-07-24: 5 mg via ORAL
  Filled 2015-07-23: qty 1

## 2015-07-23 MED ORDER — SODIUM CHLORIDE 0.9 % IV SOLN
INTRAVENOUS | Status: AC
  Administered 2015-07-23: 100 mL/h via INTRAVENOUS

## 2015-07-23 MED ORDER — FAMOTIDINE IN NACL 20-0.9 MG/50ML-% IV SOLN
20.0000 mg | Freq: Two times a day (BID) | INTRAVENOUS | Status: DC
  Administered 2015-07-23 – 2015-07-25 (×5): 20 mg via INTRAVENOUS
  Filled 2015-07-23 (×7): qty 50

## 2015-07-23 MED ORDER — SIMVASTATIN 20 MG PO TABS
20.0000 mg | ORAL_TABLET | Freq: Every day | ORAL | Status: DC
  Administered 2015-07-23 – 2015-08-09 (×18): 20 mg via ORAL
  Filled 2015-07-23 (×18): qty 1

## 2015-07-23 MED ORDER — INSULIN ASPART 100 UNIT/ML ~~LOC~~ SOLN
0.0000 [IU] | Freq: Three times a day (TID) | SUBCUTANEOUS | Status: DC
  Administered 2015-07-24: 14:00:00 via SUBCUTANEOUS
  Administered 2015-07-24: 5 [IU] via SUBCUTANEOUS
  Administered 2015-07-25: 1 [IU] via SUBCUTANEOUS
  Administered 2015-07-25: 2 [IU] via SUBCUTANEOUS
  Administered 2015-07-25: 3 [IU] via SUBCUTANEOUS
  Administered 2015-07-26: 2 [IU] via SUBCUTANEOUS
  Administered 2015-07-26: 1 [IU] via SUBCUTANEOUS
  Administered 2015-07-26 – 2015-07-27 (×3): 2 [IU] via SUBCUTANEOUS
  Administered 2015-07-27 – 2015-07-28 (×2): 3 [IU] via SUBCUTANEOUS
  Administered 2015-07-28 – 2015-07-30 (×5): 2 [IU] via SUBCUTANEOUS
  Administered 2015-07-30: 1 [IU] via SUBCUTANEOUS
  Administered 2015-07-30: 2 [IU] via SUBCUTANEOUS
  Administered 2015-07-31: 1 [IU] via SUBCUTANEOUS
  Administered 2015-07-31: 2 [IU] via SUBCUTANEOUS
  Administered 2015-08-01: 5 [IU] via SUBCUTANEOUS
  Administered 2015-08-01: 1 [IU] via SUBCUTANEOUS
  Administered 2015-08-02 (×2): 2 [IU] via SUBCUTANEOUS
  Administered 2015-08-02: 3 [IU] via SUBCUTANEOUS
  Administered 2015-08-03 (×3): 2 [IU] via SUBCUTANEOUS
  Administered 2015-08-04 (×2): 1 [IU] via SUBCUTANEOUS
  Administered 2015-08-05 (×2): 2 [IU] via SUBCUTANEOUS

## 2015-07-23 MED ORDER — MORPHINE SULFATE (PF) 2 MG/ML IV SOLN
1.0000 mg | INTRAVENOUS | Status: DC | PRN
Start: 2015-07-23 — End: 2015-07-28
  Administered 2015-07-23 – 2015-07-27 (×13): 1 mg via INTRAVENOUS
  Filled 2015-07-23 (×13): qty 1

## 2015-07-23 MED ORDER — GABAPENTIN 300 MG PO CAPS
300.0000 mg | ORAL_CAPSULE | Freq: Two times a day (BID) | ORAL | Status: DC
  Administered 2015-07-23 – 2015-07-28 (×10): 300 mg via ORAL
  Filled 2015-07-23 (×10): qty 1

## 2015-07-23 MED ORDER — LISINOPRIL 20 MG PO TABS
20.0000 mg | ORAL_TABLET | Freq: Every day | ORAL | Status: DC
Start: 2015-07-24 — End: 2015-07-26
  Administered 2015-07-24 – 2015-07-26 (×3): 20 mg via ORAL
  Filled 2015-07-23 (×3): qty 1

## 2015-07-23 MED ORDER — MORPHINE SULFATE (PF) 2 MG/ML IV SOLN: 1.0000 mg | INTRAVENOUS | Status: DC | PRN

## 2015-07-23 MED ORDER — VANCOMYCIN HCL IN DEXTROSE 1-5 GM/200ML-% IV SOLN
1000.0000 mg | Freq: Once | INTRAVENOUS | Status: AC
  Administered 2015-07-23: 1000 mg via INTRAVENOUS
  Filled 2015-07-23: qty 200

## 2015-07-23 MED ORDER — SODIUM CHLORIDE 0.9 % IV BOLUS (SEPSIS)
500.0000 mL | Freq: Once | INTRAVENOUS | Status: DC
  Administered 2015-07-23: 500 mL via INTRAVENOUS

## 2015-07-23 MED ORDER — ONDANSETRON HCL 4 MG/2ML IJ SOLN
4.0000 mg | Freq: Four times a day (QID) | INTRAMUSCULAR | Status: DC | PRN
  Administered 2015-07-23: 4 mg via INTRAVENOUS
  Filled 2015-07-23: qty 2

## 2015-07-23 MED ORDER — SODIUM CHLORIDE 0.9 % IV SOLN
1500.0000 mg | INTRAVENOUS | Status: DC
  Filled 2015-07-23 (×2): qty 1500

## 2015-07-23 NOTE — Progress Notes (Signed)
Pharmacy Antibiotic Note  Cory Weber is a 65 y.o. male admitted on 07/23/2015 with cellulitis.  Pharmacy has been consulted for vanc/zosyn dosing. Vascular Surgery consulted. AF, wbc 16.1, SCr 1.52, CrCl~56.  Pt received 1x doses of Zosyn (~1630) and Vanc (~1700) in the ED.  Plan: Zosyn 42min inf given in ED; then 3.375g IV q8h (4h inf) Additional Vanc 1g load (total 2g); then 1500mg  IV q24h Monitor clinical progress, c/s, renal function, abx plan/LOT VT@SS  as indicated   Height: 6\' 1"  (185.4 cm) Weight: 199 lb (90.266 kg) IBW/kg (Calculated) : 79.9  Temp (24hrs), Avg:99.3 F (37.4 C), Min:99.3 F (37.4 C), Max:99.3 F (37.4 C)   Recent Labs Lab 07/18/15 1330 07/18/15 1343 07/18/15 1800 07/19/15 0625 07/23/15 1548 07/23/15 1634  WBC 13.0*  --   --  12.5* 16.1*  --   CREATININE 1.43*  --   --  1.32* 1.52*  --   LATICACIDVEN  --  2.74* 2.14*  --   --  1.24    Estimated Creatinine Clearance: 56.9 mL/min (by C-G formula based on Cr of 1.52).    No Known Allergies  Antimicrobials this admission: 6/11 vanc >>  6/11 zosyn >>   Dose adjustments this admission:   Microbiology results: 6/11 BCx:    Elicia Lamp, PharmD, [facility] Clinical Pharmacist Pager [phone] 07/23/2015 8:58 PM

## 2015-07-23 NOTE — ED Notes (Signed)
Pt c/o pain to left foot that started 3 weeks ago. Pt was recently admitted here at [facility] for circulation problems with his left foot and discharged on Friday. Pt is diabetic. Pt's left foot is swollen, with open red and black sores on great toe and 4th toe. Pt denies fever. Pt has been taking the prescribed antibiotics at home since discharge on Friday.

## 2015-07-23 NOTE — ED Provider Notes (Addendum)
CSN: [id number]     Arrival date & time 07/23/15  1432 History   First MD Initiated Contact with Patient 07/23/15 1508     Chief Complaint  Patient presents with  . Foot Pain     (Consider location/radiation/quality/duration/timing/severity/associated sxs/prior Treatment) Patient is a 65 y.o. male presenting with lower extremity pain.  Foot Pain   Physical 65 year old man history of diabetes presents today with increased pain in his left foot. He was discharged 3 days ago after admission for infection of his left foot. He was discharged on antibiotics with follow-up planned with vascular surgery. During that admission he was noted to have wounds to his foot which had occurred after he had burned his foot. He had ABIs done of his left ankle brachial index of 0.45 consistent with advanced peripheral arterial disease.  He states that since that time he has had some increased pain in the foot, increased blistering on the dorsum of the foot and worsening of the wound on the fourth toe.  He denies fever or chills. He complains of some decreased discharge. He states that he has been taking the antibiotics as prescribed. He has follow-up with vascular surgery arranged for tomorrow.   . Past Medical History  Diagnosis Date  . Diabetes mellitus   . Hypertension   . Hepatitis C     states he was diagnosed in 2007 or 2007 while living in [place], [place]  . CVA (cerebral infarction) 01/2011  . Hyperlipidemia   . Asthma   . PVD (peripheral vascular disease) ([place])   . Anemia   . Stroke ([place])   . Headache(784.0)   . Peripheral neuropathy [facility])    Past Surgical History  Procedure Laterality Date  . Liver biopsy  2005    Done in [place], [place]. Chronic hepatitis with mild periportal inflammation, lobular unicellular necrosis and portal fibrosis. Grade 2, stage 1-2.  Marland Kitchen Colonoscopy with propofol N/A 09/03/2012    [id number] polyp-removed as outlined above. Prominent internal hemorrhoids. Tubular  adenoma  . Esophagogastroduodenoscopy (egd) with propofol N/A 09/03/2012    [id number] hernia. Gastric diverticulum. Gastric ulcers with associated erosions. Duodenal erosions. Status post gastric biopsy. H.PYLORI gastritis   . Biopsy N/A 09/03/2012    Procedure: BIOPSY;  Surgeon: Daneil Dolin, MD;  Location: [facility];  Service: Endoscopy;  Laterality: N/A;  gastric and gastric mucosa  . Polypectomy N/A 09/03/2012    Procedure: POLYPECTOMY;  Surgeon: Daneil Dolin, MD;  Location: [facility];  Service: Endoscopy;  Laterality: N/A;  cecal polyp  . Esophagogastroduodenoscopy (egd) with propofol N/A 12/03/2012    Dr. Gala Romney: gastric diverticulum, gastric erosions and scar. Previously noted gastric ulcer completed healed. Biopsy without H.pylori.   . Biopsy N/A 12/03/2012    Procedure: BIOPSY;  Surgeon: Daneil Dolin, MD;  Location: [facility];  Service: Endoscopy;  Laterality: N/A;  . Maximum access (mas)posterior lumbar interbody fusion (plif) 1 level N/A 11/25/2013    Procedure: FOR MAXIMUM ACCESS (MAS) POSTERIOR LUMBAR INTERBODY FUSION (PLIF) 1 LEVEL;  Surgeon: Eustace Moore, MD;  Location: [place] NEURO ORS;  Service: Neurosurgery;  Laterality: N/A;  FOR MAXIMUM ACCESS (MAS) POSTERIOR LUMBAR INTERBODY FUSION (PLIF) 1 LEVEL LUMBAR 3-4   Family History  Problem Relation Age of Onset  . Breast cancer Mother     deceased  . Cancer Mother   . Diabetes Father   . Hypertension Father   . Heart disease Father     deceased  . Hyperlipidemia Father   .  Diabetes Sister   . Hypertension Sister   . Breast cancer Sister   . Colon cancer Neg Hx   . Liver disease Neg Hx    Social History  Substance Use Topics  . Smoking status: Former Smoker -- 0.50 packs/day for 20 years    Types: Cigarettes  . Smokeless tobacco: Never Used     Comment: given 1-800-Quit Now  . Alcohol Use: No    Review of Systems  All other systems reviewed and are negative.     Allergies  Review of patient's allergies indicates no  known allergies.  Home Medications   Prior to Admission medications   Medication Sig Start Date End Date Taking? Authorizing Provider  aspirin EC 81 MG tablet Take 1 tablet (81 mg total) by mouth daily. 07/20/15   Orvan Falconer, MD  doxycycline (VIBRAMYCIN) 50 MG capsule Take 1 capsule (50 mg total) by mouth 2 (two) times daily. 07/20/15   Orvan Falconer, MD  gabapentin (NEURONTIN) 300 MG capsule Take 1 capsule (300 mg total) by mouth 2 (two) times daily. 12/03/13   Lavon Paganini Angiulli, PA-C  glipiZIDE (GLUCOTROL) 5 MG tablet Take 5 mg by mouth 2 (two) times daily before a meal.     Historical Provider, MD  HYDROcodone-acetaminophen (NORCO/VICODIN) 5-325 MG tablet Take 1 tablet by mouth every 6 (six) hours as needed. Patient not taking: Reported on 06/29/2015 06/14/15   Carole Civil, MD  lisinopril-hydrochlorothiazide (PRINZIDE,ZESTORETIC) 20-12.5 MG per tablet Take 1 tablet by mouth 2 (two) times daily. 12/03/13   Lavon Paganini Angiulli, PA-C  metFORMIN (GLUCOPHAGE) 1000 MG tablet Take 1 tablet (1,000 mg total) by mouth 2 (two) times daily with a meal. 12/03/13   Lavon Paganini Angiulli, PA-C  simvastatin (ZOCOR) 20 MG tablet TAKE ONE TABLET BY MOUTH DAILY AT 6 PM. 02/14/15   Herminio Commons, MD   BP 161/87 mmHg  Pulse 118  Temp(Src) 99.3 F (37.4 C) (Oral)  Resp 20  Ht 6\' 1"  (1.854 m)  Wt 90.266 kg  BMI 26.26 kg/m2  SpO2 100% Physical Exam  Constitutional: He is oriented to person, place, and time. He appears well-developed.  HENT:  Head: Normocephalic and atraumatic.  Right Ear: External ear normal.  Left Ear: External ear normal.  Nose: Nose normal.  Eyes: EOM are normal.  Neck: No tracheal deviation present.  Pulmonary/Chest: Effort normal.  Musculoskeletal: Normal range of motion.       Feet:  Diffuse swelling and warmth of the left lower extremity with erythema creeping up the lower leg. He has a necrotic area of the fourth toe with open wound to the left great toe. There is blistering on the  dorsal aspect of the foot.  Neurological: He is alert and oriented to person, place, and time.  Skin: Skin is warm and dry.  Psychiatric: He has a normal mood and affect. His behavior is normal.  Nursing note and vitals reviewed.   ED Course  Procedures (including critical care time) Labs Review Labs Reviewed  CBC WITH DIFFERENTIAL/PLATELET - Abnormal; Notable for the following:    WBC 16.1 (*)    RBC 3.07 (*)    Hemoglobin 8.5 (*)    HCT 26.8 (*)    Neutro Abs 13.6 (*)    All other components within normal limits  BASIC METABOLIC PANEL - Abnormal; Notable for the following:    Sodium 131 (*)    Chloride 94 (*)    Glucose, Bld 172 (*)    BUN  21 (*)    Creatinine, Ser 1.52 (*)    GFR calc non Af Amer 47 (*)    GFR calc Af Amer 55 (*)    All other components within normal limits  CULTURE, BLOOD (ROUTINE X 2)  CULTURE, BLOOD (ROUTINE X 2)  I-STAT CG4 LACTIC ACID, ED  I-STAT CG4 LACTIC ACID, ED    Imaging Review No results found. I have personally reviewed and evaluated these images and lab results as part of my medical decision-making.   EKG Interpretation None      MDM   1- foot infection with gangrene- patient with diabetic neuropathic foot with vascular insufficiency.  IV abx given here.  Discussed with Dr. Hal Hope adn Trula Slade.  Dr. Trula Slade does not feel likely needs urgent vascular surgery tonight- hospitalist can call on arrival to [facility] or in am 2- anemia- appears stable 3- dm- hyperglycemia 4- renal insufficiency  Plan transfer to [facility] to MedSurg bed. Orders entered 6:46 PM   Pattricia Boss, MD 07/23/15 1846  Patient stable- EMS here for transport.   Pattricia Boss, MD 07/23/15 1945

## 2015-07-23 NOTE — H&P (Addendum)
History and Physical    MUZAMMIL HETTICH [id number] DOB: Mar 04, 1950 DOA: 07/23/2015  PCP: Maggie Font, MD  Patient coming from: Home.  Chief Complaint: Left foot pain and blistering.  HPI: Cory Weber is a 65 y.o. male with past medical history significant for diabetes mellitus type 2, hypertension, hyperlipidemia and tobacco abuse who was recently admitted for left foot cellulitis after patient had immeresed his foot in hot water. But due to his neuropathy he did not realize how hot the water was. At that time patient had blister on his right great toe which gone down but developed wound around the great toe. Patient was admitted and placed on antibiotics and vascular surgeon was consulted and ABI was 0.4. Patient was referred to [facility] surgeon as outpatient with Dr. Bridgett Larsson. Patient states his pain worsened last 2-3 days after discharge with new blisters forming on the dorsal aspect of his left foot around the lateral toes. There is also gangrenous appearing left toe. Patient is mildly tachycardic and has leukocytosis. On-call vascular surgeon Dr. Trula Slade at [facility] was consulted by the ER physician. Patient will be transferred to [facility] for further management of his cellulitis and peripheral vascular disease. Patient is agreeable to transfer. Patient also has anemia and during last admission was noted to have stool for occult blood positive. Patient was taking NSAIDs and was instructed to stop and was advised further GI workup as outpatient.  ED Course:  Was started on IV fluids and antibiotics.  Review of Systems: As per HPI, rest all negative.   Past Medical History  Diagnosis Date  . Diabetes mellitus   . Hypertension   . Hepatitis C     states he was diagnosed in 2007 or 2007 while living in [place], [place]  . CVA (cerebral infarction) 01/2011  . Hyperlipidemia   . Asthma   . PVD (peripheral vascular disease) ([place])   . Anemia   . Stroke ([place])   . Headache(784.0)    . Peripheral neuropathy [facility])     Past Surgical History  Procedure Laterality Date  . Liver biopsy  2005    Done in [place], [place]. Chronic hepatitis with mild periportal inflammation, lobular unicellular necrosis and portal fibrosis. Grade 2, stage 1-2.  Marland Kitchen Colonoscopy with propofol N/A 09/03/2012    [id number] polyp-removed as outlined above. Prominent internal hemorrhoids. Tubular adenoma  . Esophagogastroduodenoscopy (egd) with propofol N/A 09/03/2012    [id number] hernia. Gastric diverticulum. Gastric ulcers with associated erosions. Duodenal erosions. Status post gastric biopsy. H.PYLORI gastritis   . Biopsy N/A 09/03/2012    Procedure: BIOPSY;  Surgeon: Daneil Dolin, MD;  Location: [facility];  Service: Endoscopy;  Laterality: N/A;  gastric and gastric mucosa  . Polypectomy N/A 09/03/2012    Procedure: POLYPECTOMY;  Surgeon: Daneil Dolin, MD;  Location: [facility];  Service: Endoscopy;  Laterality: N/A;  cecal polyp  . Esophagogastroduodenoscopy (egd) with propofol N/A 12/03/2012    Dr. Gala Romney: gastric diverticulum, gastric erosions and scar. Previously noted gastric ulcer completed healed. Biopsy without H.pylori.   . Biopsy N/A 12/03/2012    Procedure: BIOPSY;  Surgeon: Daneil Dolin, MD;  Location: [facility];  Service: Endoscopy;  Laterality: N/A;  . Maximum access (mas)posterior lumbar interbody fusion (plif) 1 level N/A 11/25/2013    Procedure: FOR MAXIMUM ACCESS (MAS) POSTERIOR LUMBAR INTERBODY FUSION (PLIF) 1 LEVEL;  Surgeon: Eustace Moore, MD;  Location: [place] NEURO ORS;  Service: Neurosurgery;  Laterality: N/A;  FOR MAXIMUM  ACCESS (MAS) POSTERIOR LUMBAR INTERBODY FUSION (PLIF) 1 LEVEL LUMBAR 3-4     reports that he has quit smoking. His smoking use included Cigarettes. He has a 10 pack-year smoking history. He has never used smokeless tobacco. He reports that he does not drink alcohol or use illicit drugs.  No Known Allergies  Family History  Problem Relation Age of Onset  . Breast  cancer Mother     deceased  . Cancer Mother   . Diabetes Father   . Hypertension Father   . Heart disease Father     deceased  . Hyperlipidemia Father   . Diabetes Sister   . Hypertension Sister   . Breast cancer Sister   . Colon cancer Neg Hx   . Liver disease Neg Hx     Prior to Admission medications   Medication Sig Start Date End Date Taking? Authorizing Provider  doxycycline (VIBRAMYCIN) 50 MG capsule Take 1 capsule (50 mg total) by mouth 2 (two) times daily. 07/20/15  Yes Orvan Falconer, MD  gabapentin (NEURONTIN) 300 MG capsule Take 1 capsule (300 mg total) by mouth 2 (two) times daily. 12/03/13  Yes Daniel J Angiulli, PA-C  glipiZIDE (GLUCOTROL) 5 MG tablet Take 5 mg by mouth 2 (two) times daily before a meal.    Yes Historical Provider, MD  lisinopril-hydrochlorothiazide (PRINZIDE,ZESTORETIC) 20-12.5 MG per tablet Take 1 tablet by mouth 2 (two) times daily. 12/03/13  Yes Daniel J Angiulli, PA-C  metFORMIN (GLUCOPHAGE) 1000 MG tablet Take 1 tablet (1,000 mg total) by mouth 2 (two) times daily with a meal. 12/03/13  Yes Daniel J Angiulli, PA-C  simvastatin (ZOCOR) 20 MG tablet TAKE ONE TABLET BY MOUTH DAILY AT 6 PM. 02/14/15  Yes Herminio Commons, MD  aspirin EC 81 MG tablet Take 1 tablet (81 mg total) by mouth daily. 07/20/15   Orvan Falconer, MD    Physical Exam: Filed Vitals:   07/23/15 1453 07/23/15 1735  BP: 161/87 156/78  Pulse: 118 79  Temp: 99.3 F (37.4 C)   TempSrc: Oral   Resp: 20 17  Height: 6\' 1"  (1.854 m)   Weight: 199 lb (90.266 kg)   SpO2: 100% 99%      Constitutional: Not in distress. Filed Vitals:   07/23/15 1453 07/23/15 1735  BP: 161/87 156/78  Pulse: 118 79  Temp: 99.3 F (37.4 C)   TempSrc: Oral   Resp: 20 17  Height: 6\' 1"  (1.854 m)   Weight: 199 lb (90.266 kg)   SpO2: 100% 99%   Eyes: Anicteric no pallor. ENMT:  No discharge from the ears eyes nose or mouth. Neck:  No mass felt. No neck rigidity. Respiratory:  No rhonchi or  palpitations. Cardiovascular:  S1-S2 heard. Abdomen:  Soft nontender bowel sounds present. Musculoskeletal:  Left foot looks erythematous up to the ankle with a new blister on the left foot dorsal aspect on the lateral toes and there is also gangrenous appearing left fourth toe and the great has skin which is getting infected. Skin:  See musculoskeletal system. Neurologic: alert awake oriented to time place and person. Moves all extremities. Psychiatric:  Appears normal.   Labs on Admission: I have personally reviewed following labs and imaging studies  CBC:  Recent Labs Lab 07/18/15 1330 07/19/15 0625 07/23/15 1548  WBC 13.0* 12.5* 16.1*  NEUTROABS 10.3*  --  13.6*  HGB 7.7* 9.2* 8.5*  HCT 24.9* 28.3* 26.8*  MCV 87.4 87.3 87.3  PLT 414* 370 326   Basic  Metabolic Panel:  Recent Labs Lab 07/18/15 1330 07/19/15 0625 07/23/15 1548  NA 134* 135 131*  K 4.6 4.3 3.7  CL 102 102 94*  CO2 26 26 27   GLUCOSE 318* 152* 172*  BUN 29* 19 21*  CREATININE 1.43* 1.32* 1.52*  CALCIUM 9.4 9.0 8.9   GFR: Estimated Creatinine Clearance: 56.9 mL/min (by C-G formula based on Cr of 1.52). Liver Function Tests:  Recent Labs Lab 07/18/15 1330  AST 18  ALT 14*  ALKPHOS 51  BILITOT 0.3  PROT 8.8*  ALBUMIN 3.6   No results for input(s): LIPASE, AMYLASE in the last 168 hours. No results for input(s): AMMONIA in the last 168 hours. Coagulation Profile: No results for input(s): INR, PROTIME in the last 168 hours. Cardiac Enzymes: No results for input(s): CKTOTAL, CKMB, CKMBINDEX, TROPONINI in the last 168 hours. BNP (last 3 results) No results for input(s): PROBNP in the last 8760 hours. HbA1C: No results for input(s): HGBA1C in the last 72 hours. CBG:  Recent Labs Lab 07/19/15 1623 07/19/15 2103 07/20/15 0823 07/20/15 1137 07/20/15 1405  GLUCAP 130* 233* 160* 45* 98   Lipid Profile: No results for input(s): CHOL, HDL, LDLCALC, TRIG, CHOLHDL, LDLDIRECT in the last 72  hours. Thyroid Function Tests: No results for input(s): TSH, T4TOTAL, FREET4, T3FREE, THYROIDAB in the last 72 hours. Anemia Panel: No results for input(s): VITAMINB12, FOLATE, FERRITIN, TIBC, IRON, RETICCTPCT in the last 72 hours. Urine analysis:    Component Value Date/Time   COLORURINE YELLOW 02/06/2015 1301   APPEARANCEUR HAZY* 02/06/2015 1301   LABSPEC >1.030* 02/06/2015 1301   PHURINE 5.5 02/06/2015 1301   GLUCOSEU 500* 02/06/2015 1301   HGBUR TRACE* 02/06/2015 1301   BILIRUBINUR NEGATIVE 02/06/2015 1301   KETONESUR NEGATIVE 02/06/2015 1301   PROTEINUR NEGATIVE 02/06/2015 1301   UROBILINOGEN 1.0 11/29/2013 1705   NITRITE NEGATIVE 02/06/2015 1301   LEUKOCYTESUR TRACE* 02/06/2015 1301   Sepsis Labs: @LABRCNTIP (procalcitonin:4,lacticidven:4) ) Recent Results (from the past 240 hour(s))  Culture, blood (routine x 2)     Status: None   Collection Time: 07/18/15  1:30 PM  Result Value Ref Range Status   Specimen Description BLOOD LEFT ANTECUBITAL  Final   Special Requests BOTTLES DRAWN AEROBIC AND ANAEROBIC 10CC EACH  Final   Culture NO GROWTH 5 DAYS  Final   Report Status 07/23/2015 FINAL  Final  Culture, blood (routine x 2)     Status: None   Collection Time: 07/18/15  1:35 PM  Result Value Ref Range Status   Specimen Description BLOOD LEFT ARM DRAWN BY RN  Final   Special Requests BOTTLES DRAWN AEROBIC AND ANAEROBIC 5CC EACH  Final   Culture NO GROWTH 5 DAYS  Final   Report Status 07/23/2015 FINAL  Final     Radiological Exams on Admission: No results found.  EKG: Independently reviewed. Sinus tachycardia.  Assessment/Plan Principal Problem:   Cellulitis L foot Active Problems:   Peripheral vascular disease, unspecified ([place])   Essential hypertension   Left foot infection   Type 2 diabetes mellitus with vascular disease (HCC)   Normocytic normochromic anemia   Cellulitis of left foot   #1. Cellulitis of the left foot with vascular disease - patient has been  placed on vancomycin and Zosyn. Patient will be transferred to [facility] for further management and also vascular surgery consult. Dr. Trula Slade was consulted and will need to consult Dr. Bridgett Larsson, vascular surgery in a.m. for further recommendations. Continue pain medications and fluids. Check XRAY  FOOT. #2. Normocytic normochromic anemia - recent admission patient had stool for occult blood positive. Patient was instructed to stop taking NSAIDs. Closely follow CBC. At this time I have type and screen. Patient is placed on Pepcid. #3. Diabetes mellitus type 2 with peripheral vascular disease - hold metformin while inpatient. Patient is on Glucotrol and I have placed patient on sliding scale coverage. Closely follow CBGs. #4. Hypertension - continue lisinopril. Hold hydrochlorothiazide since patient is receiving IV fluids. #5. Hyperlipidemia on statins. #6. Tobacco abuse - tobacco cessation counseling requested. #7. History of hepatitis C with possible cirrhosis and has undergone complete treatment for hepatitis C. #8. Chronic kidney disease stage III creatinine appears to be at baseline closely follow metabolic panel.  Dr.Rondell Tamala Julian will be the accepting physician.  DVT prophylaxis:  SCDs. Code Status:  Full code.  Family Communication:  No family at the bedside.  Disposition Plan:  Home.  Consults called:  Will need to reconsult vascular surgery in a.m.  Admission status:  Inpatient. MedSurg. Likely stay 3-4 days.    Rise Patience MD [facility] Pager [phone].  If 7PM-7AM, please contact night-coverage [email or web address] Password [facility]  07/23/2015, 6:59 PM

## 2015-07-24 ENCOUNTER — Inpatient Hospital Stay (HOSPITAL_COMMUNITY): Payer: Medicaid Other

## 2015-07-24 DIAGNOSIS — L089 Local infection of the skin and subcutaneous tissue, unspecified: Secondary | ICD-10-CM

## 2015-07-24 LAB — COMPREHENSIVE METABOLIC PANEL
ALBUMIN: 2.3 g/dL — AB (ref 3.5–5.0)
ALT: 33 U/L (ref 17–63)
ANION GAP: 7 (ref 5–15)
AST: 44 U/L — ABNORMAL HIGH (ref 15–41)
Alkaline Phosphatase: 47 U/L (ref 38–126)
BILIRUBIN TOTAL: 0.5 mg/dL (ref 0.3–1.2)
BUN: 15 mg/dL (ref 6–20)
CO2: 29 mmol/L (ref 22–32)
Calcium: 8.5 mg/dL — ABNORMAL LOW (ref 8.9–10.3)
Chloride: 95 mmol/L — ABNORMAL LOW (ref 101–111)
Creatinine, Ser: 1.54 mg/dL — ABNORMAL HIGH (ref 0.61–1.24)
GFR calc non Af Amer: 47 mL/min — ABNORMAL LOW (ref >60)
GFR, EST AFRICAN AMERICAN: 54 mL/min — AB (ref >60)
GLUCOSE: 231 mg/dL — AB (ref 65–99)
POTASSIUM: 3.7 mmol/L (ref 3.5–5.1)
SODIUM: 131 mmol/L — AB (ref 135–145)
TOTAL PROTEIN: 7.1 g/dL (ref 6.5–8.1)

## 2015-07-24 LAB — CBC WITH DIFFERENTIAL/PLATELET
BASOS ABS: 0 10*3/uL (ref 0.0–0.1)
Basophils Relative: 0 %
Eosinophils Absolute: 0.1 10*3/uL (ref 0.0–0.7)
Eosinophils Relative: 1 %
HEMATOCRIT: 23.9 % — AB (ref 39.0–52.0)
HEMOGLOBIN: 7.3 g/dL — AB (ref 13.0–17.0)
LYMPHS PCT: 14 %
Lymphs Abs: 1.8 10*3/uL (ref 0.7–4.0)
MCH: 26.1 pg (ref 26.0–34.0)
MCHC: 30.5 g/dL (ref 30.0–36.0)
MCV: 85.4 fL (ref 78.0–100.0)
MONO ABS: 0.9 10*3/uL (ref 0.1–1.0)
Monocytes Relative: 7 %
NEUTROS ABS: 10.4 10*3/uL — AB (ref 1.7–7.7)
NEUTROS PCT: 78 %
Platelets: 306 10*3/uL (ref 150–400)
RBC: 2.8 MIL/uL — ABNORMAL LOW (ref 4.22–5.81)
RDW: 14.1 % (ref 11.5–15.5)
WBC: 13.1 10*3/uL — ABNORMAL HIGH (ref 4.0–10.5)

## 2015-07-24 LAB — ABO/RH: ABO/RH(D): O POS

## 2015-07-24 LAB — GLUCOSE, CAPILLARY
GLUCOSE-CAPILLARY: 87 mg/dL (ref 65–99)
Glucose-Capillary: 273 mg/dL — ABNORMAL HIGH (ref 65–99)
Glucose-Capillary: 131 mg/dL — ABNORMAL HIGH (ref 65–99)

## 2015-07-24 MED ORDER — DEXTROSE 5 % IV SOLN: 1.5000 g | INTRAVENOUS | Status: DC

## 2015-07-24 NOTE — Progress Notes (Signed)
PROGRESS NOTE    Cory Weber  [id number]  DOB: 1950-10-07  DOA: 07/23/2015 PCP: Maggie Font, MD Outpatient Specialists:  Hospital course: Cory Weber is a 65 y.o. male with past medical history significant for diabetes mellitus type 2, hypertension, hyperlipidemia and tobacco abuse who was recently admitted for left foot cellulitis after patient had immeresed his foot in hot water. But due to his neuropathy he did not realize how hot the water was. At that time patient had blister on his right great toe which gone down but developed wound around the great toe. Patient was admitted and placed on antibiotics and vascular surgeon was consulted and ABI was 0.4. Patient was referred to [facility] surgeon as outpatient with Dr. Bridgett Larsson. Patient states his pain worsened last 2-3 days after discharge with new blisters forming on the dorsal aspect of his left foot around the lateral toes. There is also gangrenous appearing left toe. Patient is mildly tachycardic and has leukocytosis. On-call vascular surgeon Dr. Trula Slade at [name] was consulted by the ER physician. Patient will be transferred to [facility] for further management of his cellulitis and peripheral vascular disease. Patient is agreeable to transfer. Patient also has anemia and during last admission was noted to have stool for occult blood positive. Patient was taking NSAIDs and was instructed to stop and was advised further GI workup as outpatient.  Assessment & Plan:   #1. Cellulitis of the left foot with vascular disease - patient has been placed on vancomycin and Zosyn. vascular surgery consulted. Continue pain medications and fluids. Check XRAY FOOT. #2. Normocytic normochromic anemia - recent admission patient had stool for occult blood positive. Patient was instructed to stop taking NSAIDs. Closely follow CBC. At this time I have type and screen. Patient is placed on Pepcid. #3. Diabetes mellitus type 2 with peripheral vascular  disease - hold metformin while inpatient. Patient is on Glucotrol and I have placed patient on sliding scale coverage. Closely follow CBGs. #4. Hypertension - continue lisinopril. Hold hydrochlorothiazide since patient is receiving IV fluids. #5. Hyperlipidemia on statins. #6. Tobacco abuse - tobacco cessation counseling requested. #7. History of hepatitis C with possible cirrhosis and has undergone complete treatment for hepatitis C. #8. Chronic kidney disease stage III creatinine appears to be at baseline closely follow metabolic panel.  DVT prophylaxis: SCDs. Code Status: Full code.  Family Communication: No family at the bedside.  Disposition Plan: Home.  Consults called: vascular surgery  Admission status: Inpatient. MedSurg. Likely stay 3-4 days.   Consultants:  Vascular surgery  Procedures:  Antimicrobials: Anti-infectives    Start     Dose/Rate Route Frequency Ordered Stop   07/24/15 1800  vancomycin (VANCOCIN) 1,500 mg in sodium chloride 0.9 % 500 mL IVPB     1,500 mg 250 mL/hr over 120 Minutes Intravenous Every 24 hours 07/23/15 2104     07/24/15 0030  piperacillin-tazobactam (ZOSYN) IVPB 3.375 g     3.375 g 12.5 mL/hr over 240 Minutes Intravenous Every 8 hours 07/23/15 2104     07/23/15 2115  vancomycin (VANCOCIN) IVPB 1000 mg/200 mL premix     1,000 mg 200 mL/hr over 60 Minutes Intravenous NOW 07/23/15 2104 07/23/15 2342   07/23/15 1545  piperacillin-tazobactam (ZOSYN) IVPB 3.375 g     3.375 g 12.5 mL/hr over 240 Minutes Intravenous  Once 07/23/15 1544 07/23/15 1709   07/23/15 1545  vancomycin (VANCOCIN) IVPB 1000 mg/200 mL premix     1,000 mg 200 mL/hr over 60  Minutes Intravenous  Once 07/23/15 1544 07/23/15 1812     Subjective: Pt reports that he has less pain now.   Objective: Filed Vitals:   07/23/15 1735 07/23/15 1900 07/23/15 2115 07/24/15 0606  BP: 156/78 152/87 159/74 134/71  Pulse: 79 103 103 104  Temp:   99.2 F (37.3 C) 100.7 F (38.2 C)   TempSrc:   Oral Oral  Resp: 17 29 20 20   Height:      Weight:      SpO2: 99% 97% 96% 97%    Intake/Output Summary (Last 24 hours) at 07/24/15 1149 Last data filed at 07/24/15 0900  Gross per 24 hour  Intake    240 ml  Output      0 ml  Net    240 ml   Filed Weights   07/23/15 1453  Weight: 199 lb (90.266 kg)    Exam: Eyes: Anicteric no pallor. ENMT: No discharge from the ears eyes nose or mouth. Neck: No mass felt. No neck rigidity. Respiratory: No rhonchi or palpitations. Cardiovascular: normal S1-S2 heard. Abdomen: Soft nontender bowel sounds present. Musculoskeletal: Left foot looks erythematous up to the ankle with a new blister on the left foot dorsal aspect on the lateral toes and there is also gangrenous appearing left fourth toe and the great has skin which is getting infected. Skin: See above. Neurologic: alert awake oriented to time place and person. Moves all extremities. Psychiatric: Appears normal.  Data Reviewed: Basic Metabolic Panel:  Recent Labs Lab 07/18/15 1330 07/19/15 0625 07/23/15 1548 07/24/15 0313  NA 134* 135 131* 131*  K 4.6 4.3 3.7 3.7  CL 102 102 94* 95*  CO2 26 26 27 29   GLUCOSE 318* 152* 172* 231*  BUN 29* 19 21* 15  CREATININE 1.43* 1.32* 1.52* 1.54*  CALCIUM 9.4 9.0 8.9 8.5*   Liver Function Tests:  Recent Labs Lab 07/18/15 1330 07/24/15 0313  AST 18 44*  ALT 14* 33  ALKPHOS 51 47  BILITOT 0.3 0.5  PROT 8.8* 7.1  ALBUMIN 3.6 2.3*   No results for input(s): LIPASE, AMYLASE in the last 168 hours. No results for input(s): AMMONIA in the last 168 hours. CBC:  Recent Labs Lab 07/18/15 1330 07/19/15 0625 07/23/15 1548 07/24/15 0313  WBC 13.0* 12.5* 16.1* 13.1*  NEUTROABS 10.3*  --  13.6* 10.4*  HGB 7.7* 9.2* 8.5* 7.3*  HCT 24.9* 28.3* 26.8* 23.9*  MCV 87.4 87.3 87.3 85.4  PLT 414* 370 326 306   Cardiac Enzymes: No results for input(s): CKTOTAL, CKMB, CKMBINDEX, TROPONINI in the last 168 hours. BNP  (last 3 results) No results for input(s): PROBNP in the last 8760 hours. CBG:  Recent Labs Lab 07/20/15 0823 07/20/15 1137 07/20/15 1405 07/23/15 2133 07/24/15 0613  GLUCAP 160* 45* 98 211* 273*    Recent Results (from the past 240 hour(s))  Culture, blood (routine x 2)     Status: None   Collection Time: 07/18/15  1:30 PM  Result Value Ref Range Status   Specimen Description BLOOD LEFT ANTECUBITAL  Final   Special Requests BOTTLES DRAWN AEROBIC AND ANAEROBIC 10CC EACH  Final   Culture NO GROWTH 5 DAYS  Final   Report Status 07/23/2015 FINAL  Final  Culture, blood (routine x 2)     Status: None   Collection Time: 07/18/15  1:35 PM  Result Value Ref Range Status   Specimen Description BLOOD LEFT ARM DRAWN BY RN  Final   Special Requests BOTTLES DRAWN AEROBIC  AND ANAEROBIC 5CC EACH  Final   Culture NO GROWTH 5 DAYS  Final   Report Status 07/23/2015 FINAL  Final  Blood culture (routine x 2)     Status: None (Preliminary result)   Collection Time: 07/23/15  3:51 PM  Result Value Ref Range Status   Specimen Description LEFT ANTECUBITAL  Final   Special Requests BOTTLES DRAWN AEROBIC AND ANAEROBIC 4CC EACH  Final   Culture NO GROWTH < 24 HOURS  Final   Report Status PENDING  Incomplete  Blood culture (routine x 2)     Status: None (Preliminary result)   Collection Time: 07/23/15  4:30 PM  Result Value Ref Range Status   Specimen Description BLOOD IV DRAWN  Final   Special Requests BOTTLES DRAWN AEROBIC AND ANAEROBIC 8CC EACH  Final   Culture NO GROWTH < 24 HOURS  Final   Report Status PENDING  Incomplete    Studies: Dg Foot 2 Views Left  07/24/2015  CLINICAL DATA:  Cellulitis with wound on first toe EXAM: LEFT FOOT - 2 VIEW COMPARISON:  July 18, 2015 FINDINGS: Frontal and lateral views were obtained. There is no demonstrable fracture or dislocation. The joint spaces appear normal. No erosive change or bony destruction. There is soft tissue swelling over the dorsum of the foot.  There is no soft tissue air or radiopaque foreign body. IMPRESSION: No bony destruction or erosion. No fracture or dislocation. Soft tissue swelling over the dorsal foot. No soft tissue abscess or radiopaque foreign body evident. Electronically Signed   By: Lowella Grip III M.D.   On: 07/24/2015 07:23   Scheduled Meds: . famotidine (PEPCID) IV  20 mg Intravenous Q12H  . gabapentin  300 mg Oral BID  . glipiZIDE  5 mg Oral BID AC  . insulin aspart  0-9 Units Subcutaneous TID WC  . lisinopril  20 mg Oral Daily  . piperacillin-tazobactam (ZOSYN)  IV  3.375 g Intravenous Q8H  . simvastatin  20 mg Oral q1800  . vancomycin  1,500 mg Intravenous Q24H   Continuous Infusions: . sodium chloride 100 mL/hr (07/23/15 2156)   Principal Problem:   Cellulitis L foot Active Problems:   Peripheral vascular disease, unspecified ([place])   Essential hypertension   Left foot infection   Type 2 diabetes mellitus with vascular disease (HCC)   Normocytic normochromic anemia   Cellulitis of left foot  Time spent:   Irwin Brakeman, MD, FAAFP [facility] Pager [phone]  If 7PM-7AM, please contact night-coverage [email or web address] Password [email or web address] 07/24/2015, 11:49 AM    LOS: 1 day

## 2015-07-24 NOTE — Progress Notes (Signed)
Inpatient Diabetes Program Recommendations  AACE/ADA: New Consensus Statement on Inpatient Glycemic Control (2015)  Target Ranges:  Prepandial:   less than 140 mg/dL      Peak postprandial:   less than 180 mg/dL (1-2 hours)      Critically ill patients:  140 - 180 mg/dL  Results for DONTERRIUS, RECINE (MRN [id number]) as of 07/24/2015 10:52  Ref. Range 07/23/2015 21:33 07/24/2015 06:13  Glucose-Capillary Latest Ref Range: 65-99 mg/dL 211 (H) 273 (H)  Results for TAHMIR, ZUMBA (MRN [id number]) as of 07/24/2015 10:52  Ref. Range 07/24/2015 03:13  Glucose Latest Ref Range: 65-99 mg/dL 231 (H)    Review of Glycemic Control  Diabetes history: DM2 Outpatient Diabetes medications: Glipizide 5 mg BID, Metformin 1000 mg BID Current orders for Inpatient glycemic control: Glipizide 5 mg BID, Novolog 0-9 units TID with meals  Inpatient Diabetes Program Recommendations: HgbA1C: Please consider ordering an A1C to evaluate glycemic control over the past 2-3 months.  Thanks, Barnie Alderman, RN, MSN, CDE Diabetes Coordinator Inpatient Diabetes Program [phone] (Team Pager from [place] to [place]) [phone] ([facility] office) [phone] [facility] office) [phone] [facility] office)

## 2015-07-24 NOTE — Progress Notes (Signed)
Full note to follow.  Plan for angiography tomorrow.  Cory Weber

## 2015-07-25 LAB — CBC
HCT: 23.7 % — ABNORMAL LOW (ref 39.0–52.0)
HEMOGLOBIN: 7.2 g/dL — AB (ref 13.0–17.0)
MCH: 26.1 pg (ref 26.0–34.0)
MCHC: 30.4 g/dL (ref 30.0–36.0)
MCV: 85.9 fL (ref 78.0–100.0)
PLATELETS: 308 10*3/uL (ref 150–400)
RBC: 2.76 MIL/uL — AB (ref 4.22–5.81)
RDW: 14.1 % (ref 11.5–15.5)
WBC: 12.4 10*3/uL — AB (ref 4.0–10.5)

## 2015-07-25 LAB — HEMOGLOBIN A1C
Hgb A1c MFr Bld: 7 % — ABNORMAL HIGH (ref 4.8–5.6)
Mean Plasma Glucose: 154 mg/dL

## 2015-07-25 LAB — BASIC METABOLIC PANEL
ANION GAP: 10 (ref 5–15)
BUN: 13 mg/dL (ref 6–20)
CHLORIDE: 97 mmol/L — AB (ref 101–111)
CO2: 25 mmol/L (ref 22–32)
CREATININE: 1.72 mg/dL — AB (ref 0.61–1.24)
Calcium: 8.6 mg/dL — ABNORMAL LOW (ref 8.9–10.3)
GFR calc non Af Amer: 41 mL/min — ABNORMAL LOW (ref >60)
GFR, EST AFRICAN AMERICAN: 47 mL/min — AB (ref >60)
Glucose, Bld: 148 mg/dL — ABNORMAL HIGH (ref 65–99)
POTASSIUM: 3.6 mmol/L (ref 3.5–5.1)
SODIUM: 132 mmol/L — AB (ref 135–145)

## 2015-07-25 LAB — GLUCOSE, CAPILLARY
GLUCOSE-CAPILLARY: 162 mg/dL — AB (ref 65–99)
GLUCOSE-CAPILLARY: 141 mg/dL — AB (ref 65–99)
Glucose-Capillary: 223 mg/dL — ABNORMAL HIGH (ref 65–99)
Glucose-Capillary: 253 mg/dL — ABNORMAL HIGH (ref 65–99)

## 2015-07-25 LAB — PROTIME-INR
INR: 1.33 (ref 0.00–1.49)
Prothrombin Time: 16.6 seconds — ABNORMAL HIGH (ref 11.6–15.2)

## 2015-07-25 MED ORDER — HYDROCODONE-ACETAMINOPHEN 5-325 MG PO TABS
1.0000 | ORAL_TABLET | ORAL | Status: DC | PRN
  Administered 2015-07-25 – 2015-07-28 (×13): 2 via ORAL
  Filled 2015-07-25 (×16): qty 2

## 2015-07-25 MED ORDER — DEXTROSE 5 % IV SOLN
1.5000 g | INTRAVENOUS | Status: DC
  Filled 2015-07-25: qty 1.5

## 2015-07-25 MED ORDER — SODIUM CHLORIDE 0.9 % IV SOLN
1500.0000 mg | INTRAVENOUS | Status: DC
  Administered 2015-07-25 – 2015-07-26 (×2): 1500 mg via INTRAVENOUS
  Filled 2015-07-25 (×3): qty 1500

## 2015-07-25 MED ORDER — SODIUM CHLORIDE 0.9 % IV SOLN
INTRAVENOUS | Status: DC
  Administered 2015-07-25: 12:00:00 via INTRAVENOUS

## 2015-07-25 NOTE — Progress Notes (Signed)
PROGRESS NOTE    Cory Weber  [id number]  DOB: [birth year]  DOA: 07/23/2015 PCP: Maggie Font, MD Outpatient Specialists:  Hospital course: Cory Weber is a 65 y.o. male with past medical history significant for diabetes mellitus type 2, hypertension, hyperlipidemia and tobacco abuse who was recently admitted for left foot cellulitis after patient had immeresed his foot in hot water. But due to his neuropathy he did not realize how hot the water was. At that time patient had blister on his right great toe which gone down but developed wound around the great toe. Patient was admitted and placed on antibiotics and vascular surgeon was consulted and ABI was 0.4. Patient was referred to [facility] surgeon as outpatient with Dr. Bridgett Larsson. Patient states his pain worsened last 2-3 days after discharge with new blisters forming on the dorsal aspect of his left foot around the lateral toes. There is also gangrenous appearing left toe. Patient is mildly tachycardic and has leukocytosis. On-call vascular surgeon Dr. Trula Slade at [name] was consulted by the ER physician. Patient transferred to [facility] [place] [place] for further management of his cellulitis and peripheral vascular disease. Patient is agreeable to transfer. Patient also has anemia and during last admission was noted to have stool for occult blood positive. Patient was taking NSAIDs and was instructed to stop and was advised further GI workup as outpatient.  Assessment & Plan:   #1. Cellulitis of the left foot with vascular disease - patient has been placed on vancomycin and Zosyn. vascular surgery consulted. Vascular lab rescheduled procedure to 6/14.  Continue pain medications and fluids.  #2. Normocytic normochromic anemia - recent admission patient had stool for occult blood positive. Patient was instructed to stop taking NSAIDs. Closely follow CBC. type and screen. Patient is placed on Pepcid. #3. Diabetes mellitus type 2 with peripheral  vascular disease - hold metformin and sulfonylurea while inpatient. Patient is on sliding scale coverage. Closely follow CBGs. #4. Hypertension - continue lisinopril. Hold hydrochlorothiazide since patient is receiving IV fluids. #5. Hyperlipidemia on statins. #6. Tobacco abuse - tobacco cessation counseling requested. #7. History of hepatitis C with possible cirrhosis and has undergone complete treatment for hepatitis C. #8. Chronic kidney disease stage III creatinine appears to be at baseline closely follow metabolic panel. Avoid nephrotoxins.   DVT prophylaxis: SCDs. Code Status: Full code.  Family Communication: discussed with daughter from DC at bedside Disposition Plan: Home.  Consults called: vascular surgery  Admission status: Inpatient. MedSurg. Likely stay 3-4 days.   Consultants:  Vascular surgery  Procedures:  Antimicrobials: Anti-infectives    Start     Dose/Rate Route Frequency Ordered Stop   07/26/15 1100  cefUROXime (ZINACEF) 1.5 g in dextrose 5 % 50 mL IVPB     1.5 g 100 mL/hr over 30 Minutes Intravenous To ShortStay Surgical 07/25/15 1153 07/27/15 1100   07/25/15 1230  vancomycin (VANCOCIN) 1,500 mg in sodium chloride 0.9 % 500 mL IVPB     1,500 mg 250 mL/hr over 120 Minutes Intravenous Every 24 hours 07/25/15 1152     07/25/15 0600  cefUROXime (ZINACEF) 1.5 g in dextrose 5 % 50 mL IVPB  Status:  Discontinued     1.5 g 100 mL/hr over 30 Minutes Intravenous To ShortStay Surgical 07/24/15 1322 07/25/15 1153   07/24/15 1800  vancomycin (VANCOCIN) 1,500 mg in sodium chloride 0.9 % 500 mL IVPB  Status:  Discontinued     1,500 mg 250 mL/hr over 120 Minutes Intravenous Every 24 hours 07/23/15  2104 07/25/15 1152   07/24/15 0030  piperacillin-tazobactam (ZOSYN) IVPB 3.375 g     3.375 g 12.5 mL/hr over 240 Minutes Intravenous Every 8 hours 07/23/15 2104     07/23/15 2115  vancomycin (VANCOCIN) IVPB 1000 mg/200 mL premix     1,000 mg 200 mL/hr over 60 Minutes  Intravenous NOW 07/23/15 2104 07/23/15 2342   07/23/15 1545  piperacillin-tazobactam (ZOSYN) IVPB 3.375 g     3.375 g 12.5 mL/hr over 240 Minutes Intravenous  Once 07/23/15 1544 07/23/15 1709   07/23/15 1545  vancomycin (VANCOCIN) IVPB 1000 mg/200 mL premix     1,000 mg 200 mL/hr over 60 Minutes Intravenous  Once 07/23/15 1544 07/23/15 1812     Subjective: Pt reports that he has less pain now.   Objective: Filed Vitals:   07/24/15 0606 07/24/15 1300 07/24/15 2042 07/25/15 0438  BP: 134/71 139/97 123/71 128/78  Pulse: 104 108 96 100  Temp: 100.7 F (38.2 C) 101.5 F (38.6 C) 99.1 F (37.3 C) 99.8 F (37.7 C)  TempSrc: Oral Oral Oral Oral  Resp: 20 20 20 18   Height:      Weight:      SpO2: 97% 99% 95% 100%    Intake/Output Summary (Last 24 hours) at 07/25/15 1211 Last data filed at 07/25/15 0800  Gross per 24 hour  Intake    480 ml  Output   1750 ml  Net  -1270 ml   Filed Weights   07/23/15 1453  Weight: 199 lb (90.266 kg)    Exam: Eyes: Anicteric no pallor. ENMT: No discharge from the ears eyes nose or mouth. Neck: No mass felt. No neck rigidity. Respiratory: No rhonchi or palpitations. Cardiovascular: normal S1-S2 heard. Abdomen: Soft nontender bowel sounds present. Musculoskeletal: Left foot looks erythematous up to the ankle with a new blister on the left foot dorsal aspect on the lateral toes and there is also gangrenous appearing left fourth toe and the great has skin which is getting infected. Skin: See above. Neurologic: alert awake oriented to time place and person. Moves all extremities. Psychiatric: Appears normal.  Data Reviewed: Basic Metabolic Panel:  Recent Labs Lab 07/18/15 1330 07/19/15 0625 07/23/15 1548 07/24/15 0313 07/25/15 0519  NA 134* 135 131* 131* 132*  K 4.6 4.3 3.7 3.7 3.6  CL 102 102 94* 95* 97*  CO2 26 26 27 29 25   GLUCOSE 318* 152* 172* 231* 148*  BUN 29* 19 21* 15 13  CREATININE 1.43* 1.32* 1.52* 1.54* 1.72*    CALCIUM 9.4 9.0 8.9 8.5* 8.6*   Liver Function Tests:  Recent Labs Lab 07/18/15 1330 07/24/15 0313  AST 18 44*  ALT 14* 33  ALKPHOS 51 47  BILITOT 0.3 0.5  PROT 8.8* 7.1  ALBUMIN 3.6 2.3*   No results for input(s): LIPASE, AMYLASE in the last 168 hours. No results for input(s): AMMONIA in the last 168 hours. CBC:  Recent Labs Lab 07/18/15 1330 07/19/15 0625 07/23/15 1548 07/24/15 0313 07/25/15 0519  WBC 13.0* 12.5* 16.1* 13.1* 12.4*  NEUTROABS 10.3*  --  13.6* 10.4*  --   HGB 7.7* 9.2* 8.5* 7.3* 7.2*  HCT 24.9* 28.3* 26.8* 23.9* 23.7*  MCV 87.4 87.3 87.3 85.4 85.9  PLT 414* 370 326 306 308   Cardiac Enzymes: No results for input(s): CKTOTAL, CKMB, CKMBINDEX, TROPONINI in the last 168 hours. BNP (last 3 results) No results for input(s): PROBNP in the last 8760 hours. CBG:  Recent Labs Lab 07/24/15 0613 07/24/15 1140  07/24/15 1625 07/25/15 0630 07/25/15 1133  GLUCAP 273* 131* 87 162* 141*    Recent Results (from the past 240 hour(s))  Culture, blood (routine x 2)     Status: None   Collection Time: 07/18/15  1:30 PM  Result Value Ref Range Status   Specimen Description BLOOD LEFT ANTECUBITAL  Final   Special Requests BOTTLES DRAWN AEROBIC AND ANAEROBIC 10CC EACH  Final   Culture NO GROWTH 5 DAYS  Final   Report Status 07/23/2015 FINAL  Final  Culture, blood (routine x 2)     Status: None   Collection Time: 07/18/15  1:35 PM  Result Value Ref Range Status   Specimen Description BLOOD LEFT ARM DRAWN BY RN  Final   Special Requests BOTTLES DRAWN AEROBIC AND ANAEROBIC 5CC EACH  Final   Culture NO GROWTH 5 DAYS  Final   Report Status 07/23/2015 FINAL  Final  Blood culture (routine x 2)     Status: None (Preliminary result)   Collection Time: 07/23/15  3:51 PM  Result Value Ref Range Status   Specimen Description BLOOD LEFT ANTECUBITAL  Final   Special Requests BOTTLES DRAWN AEROBIC AND ANAEROBIC 4CC EACH  Final   Culture NO GROWTH 2 DAYS  Final    Report Status PENDING  Incomplete  Blood culture (routine x 2)     Status: None (Preliminary result)   Collection Time: 07/23/15  4:30 PM  Result Value Ref Range Status   Specimen Description BLOOD SITE NOT SPECIFIED DRAWN BY RN  Final   Special Requests BOTTLES DRAWN AEROBIC AND ANAEROBIC 8CC EACH  Final   Culture NO GROWTH 2 DAYS  Final   Report Status PENDING  Incomplete    Studies: Dg Foot 2 Views Left  07/24/2015  CLINICAL DATA:  Cellulitis with wound on first toe EXAM: LEFT FOOT - 2 VIEW COMPARISON:  July 18, 2015 FINDINGS: Frontal and lateral views were obtained. There is no demonstrable fracture or dislocation. The joint spaces appear normal. No erosive change or bony destruction. There is soft tissue swelling over the dorsum of the foot. There is no soft tissue air or radiopaque foreign body. IMPRESSION: No bony destruction or erosion. No fracture or dislocation. Soft tissue swelling over the dorsal foot. No soft tissue abscess or radiopaque foreign body evident. Electronically Signed   By: Lowella Grip III M.D.   On: 07/24/2015 07:23   Scheduled Meds: . [START ON 07/26/2015] cefUROXime (ZINACEF)  IV  1.5 g Intravenous To SS-Surg  . famotidine (PEPCID) IV  20 mg Intravenous Q12H  . gabapentin  300 mg Oral BID  . insulin aspart  0-9 Units Subcutaneous TID WC  . lisinopril  20 mg Oral Daily  . piperacillin-tazobactam (ZOSYN)  IV  3.375 g Intravenous Q8H  . simvastatin  20 mg Oral q1800  . vancomycin  1,500 mg Intravenous Q24H   Continuous Infusions: . sodium chloride 75 mL/hr at 07/25/15 1143   Principal Problem:   Cellulitis L foot Active Problems:   Peripheral vascular disease, unspecified ([place])   Essential hypertension   Left foot infection   Type 2 diabetes mellitus with vascular disease (HCC)   Normocytic normochromic anemia   Cellulitis of left foot  Time spent:   Irwin Brakeman, MD, FAAFP [facility] Pager [phone]  If 7PM-7AM, please contact  night-coverage [email or web address] Password [email or web address] 07/25/2015, 12:11 PM    LOS: 2 days

## 2015-07-25 NOTE — Progress Notes (Signed)
Received call from vascular lab that pt's procedure will be rescheduled for tomorrow at 11:30 AM. Pt and family notified and verbalize understanding. MD paged with update and placed a diet order. Will continue to monitor

## 2015-07-25 NOTE — Progress Notes (Signed)
Angiogram canceled today because of slight increase in creatinine.  Would recommend IV hydration overnight and we will try to repeat this tomorrow.  He'll be nothing by mouth after midnight.  Cory Weber

## 2015-07-25 NOTE — Consult Note (Signed)
Consult Note  Patient name: Cory Weber MRN: [id number] DOB: 21-Oct-1950 Sex: male  Consulting Physician:  Dr. Hal Hope  Reason for Consult:  Chief Complaint  Patient presents with  . Foot Pain    HISTORY OF PRESENT ILLNESS: This is a 65 year old gentleman who presented with Left foot cellulitis after immersing his foot in hot water.  Because of his diabetic neuropathy, he was unaware of how hot the water was.  He was admitted and started on IV antibiotics.  The patient is a diabetic.  His hemoglobin A1c is 7.0.  He is a former smoker.  He takes Zocor for hypercholesterolemia.  He is on multiple medications for hypertension.  ABIs are 0.91 on the right and 0.45 on the left  Past Medical History  Diagnosis Date  . Diabetes mellitus   . Hypertension   . Hepatitis C     states he was diagnosed in 2007 or 2007 while living in [place], [place]  . CVA (cerebral infarction) 01/2011  . Hyperlipidemia   . Asthma   . PVD (peripheral vascular disease) ([place])   . Anemia   . Stroke ([place])   . Headache(784.0)   . Peripheral neuropathy [facility])     Past Surgical History  Procedure Laterality Date  . Liver biopsy  2005    Done in [place], [place]. Chronic hepatitis with mild periportal inflammation, lobular unicellular necrosis and portal fibrosis. Grade 2, stage 1-2.  Marland Kitchen Colonoscopy with propofol N/A 09/03/2012    [id number] polyp-removed as outlined above. Prominent internal hemorrhoids. Tubular adenoma  . Esophagogastroduodenoscopy (egd) with propofol N/A 09/03/2012    [id number] hernia. Gastric diverticulum. Gastric ulcers with associated erosions. Duodenal erosions. Status post gastric biopsy. H.PYLORI gastritis   . Biopsy N/A 09/03/2012    Procedure: BIOPSY;  Surgeon: Daneil Dolin, MD;  Location: [facility];  Service: Endoscopy;  Laterality: N/A;  gastric and gastric mucosa  . Polypectomy N/A 09/03/2012    Procedure: POLYPECTOMY;  Surgeon: Daneil Dolin, MD;  Location: [facility];   Service: Endoscopy;  Laterality: N/A;  cecal polyp  . Esophagogastroduodenoscopy (egd) with propofol N/A 12/03/2012    Dr. Gala Romney: gastric diverticulum, gastric erosions and scar. Previously noted gastric ulcer completed healed. Biopsy without H.pylori.   . Biopsy N/A 12/03/2012    Procedure: BIOPSY;  Surgeon: Daneil Dolin, MD;  Location: [facility];  Service: Endoscopy;  Laterality: N/A;  . Maximum access (mas)posterior lumbar interbody fusion (plif) 1 level N/A 11/25/2013    Procedure: FOR MAXIMUM ACCESS (MAS) POSTERIOR LUMBAR INTERBODY FUSION (PLIF) 1 LEVEL;  Surgeon: Eustace Moore, MD;  Location: [facility];  Service: Neurosurgery;  Laterality: N/A;  FOR MAXIMUM ACCESS (MAS) POSTERIOR LUMBAR INTERBODY FUSION (PLIF) 1 LEVEL LUMBAR 3-4    Social History   Social History  . Marital Status: Single    Spouse Name: N/A  . Number of Children: 1  . Years of Education: N/A   Occupational History  . trying to get disability    Social History Main Topics  . Smoking status: Former Smoker -- 0.50 packs/day for 20 years    Types: Cigarettes  . Smokeless tobacco: Never Used     Comment: given 1-800-Quit Now  . Alcohol Use: No  . Drug Use: No     Comment: last used April 2016.  Marland Kitchen Sexual Activity: Not on file   Other Topics Concern  . Not on file   Social History Narrative  Family History  Problem Relation Age of Onset  . Breast cancer Mother     deceased  . Cancer Mother   . Diabetes Father   . Hypertension Father   . Heart disease Father     deceased  . Hyperlipidemia Father   . Diabetes Sister   . Hypertension Sister   . Breast cancer Sister   . Colon cancer Neg Hx   . Liver disease Neg Hx     Allergies as of 07/23/2015  . (No Known Allergies)    No current facility-administered medications on file prior to encounter.   Current Outpatient Prescriptions on File Prior to Encounter  Medication Sig Dispense Refill  . doxycycline (VIBRAMYCIN) 50 MG capsule Take 1  capsule (50 mg total) by mouth 2 (two) times daily. 10 capsule 0  . gabapentin (NEURONTIN) 300 MG capsule Take 1 capsule (300 mg total) by mouth 2 (two) times daily. 60 capsule 1  . glipiZIDE (GLUCOTROL) 5 MG tablet Take 5 mg by mouth 2 (two) times daily before a meal.     . lisinopril-hydrochlorothiazide (PRINZIDE,ZESTORETIC) 20-12.5 MG per tablet Take 1 tablet by mouth 2 (two) times daily. 30 tablet 1  . metFORMIN (GLUCOPHAGE) 1000 MG tablet Take 1 tablet (1,000 mg total) by mouth 2 (two) times daily with a meal. 60 tablet 1  . simvastatin (ZOCOR) 20 MG tablet TAKE ONE TABLET BY MOUTH DAILY AT 6 PM. 30 tablet 0  . aspirin EC 81 MG tablet Take 1 tablet (81 mg total) by mouth daily. 100 tablet 3  . [DISCONTINUED] ferrous fumarate (HEMOCYTE - 106 MG FE) 325 (106 FE) MG TABS tablet Take 1 tablet (106 mg of iron total) by mouth 2 (two) times daily. (Patient not taking: Reported on 04/01/2014) 60 each 0     REVIEW OF SYSTEMS: Cardiovascular: No chest pain, chest pressure, palpitations, orthopnea, or dyspnea on exertion. No claudication or rest pain,  No history of DVT or phlebitis. Pulmonary: No productive cough, asthma or wheezing. Neurologic: No weakness, paresthesias, aphasia, or amaurosis. No dizziness. Hematologic: No bleeding problems or clotting disorders. Musculoskeletal: No joint pain or joint swelling. Gastrointestinal: No blood in stool or hematemesis Genitourinary: No dysuria or hematuria. Psychiatric:: No history of major depression. Integumentary: Left foot blistering and redness Constitutional: No fever or chills.  PHYSICAL EXAMINATION: General: The patient appears their stated age.  Vital signs are BP 128/78 mmHg  Pulse 100  Temp(Src) 99.8 F (37.7 C) (Oral)  Resp 18  Ht 6\' 1"  (1.854 m)  Wt 199 lb (90.266 kg)  BMI 26.26 kg/m2  SpO2 100% Pulmonary: Respirations are non-labored HEENT:  No gross abnormalities Abdomen: Soft and non-tender  Musculoskeletal: There are no  major deformities.   Neurologic: No focal weakness or paresthesias are detected, Skin: Gangrenous changes to the great toe and fourth toe with blistering on the dorsum of the foot and surrounding erythema. Psychiatric: The patient has normal affect. Cardiovascular: There is a regular rate and rhythm without significant murmur appreciated.  Diagnostic Studies: I have reviewed his ABIs.  The left is 0.45 of the right is 0.91    Assessment:  Left foot ulcer Plan: I discussed with the patient and his daughter that he is at risk for limb loss given the tissue damage on the left foot with infection as well as in combination with his lower extremity vascular disease and diabetes.  Next step is to proceed with angiography to better define his anatomy and to intervene if feasible.  This will be done through a right femoral approach.  Contrast utilization will be minimized because of his creatinine.  Continue to hydrate him as well as continue IV antibiotics and strict blood glucose control      V. Leia Alf, M.D. [facility] Office: [phone] Pager:  [phone]

## 2015-07-26 ENCOUNTER — Encounter (HOSPITAL_COMMUNITY)
Admission: EM | Disposition: A | Discharge status: Home / Self Care | Payer: Self-pay | Source: Home / Self Care | Attending: Internal Medicine

## 2015-07-26 LAB — COMPREHENSIVE METABOLIC PANEL
ALBUMIN: 2.3 g/dL — AB (ref 3.5–5.0)
ALT: 65 U/L — ABNORMAL HIGH (ref 17–63)
ANION GAP: 8 (ref 5–15)
AST: 54 U/L — AB (ref 15–41)
Alkaline Phosphatase: 68 U/L (ref 38–126)
BUN: 14 mg/dL (ref 6–20)
CHLORIDE: 100 mmol/L — AB (ref 101–111)
CO2: 25 mmol/L (ref 22–32)
Calcium: 8.4 mg/dL — ABNORMAL LOW (ref 8.9–10.3)
Creatinine, Ser: 1.87 mg/dL — ABNORMAL HIGH (ref 0.61–1.24)
GFR calc Af Amer: 43 mL/min — ABNORMAL LOW (ref >60)
GFR calc non Af Amer: 37 mL/min — ABNORMAL LOW (ref >60)
GLUCOSE: 162 mg/dL — AB (ref 65–99)
POTASSIUM: 3.8 mmol/L (ref 3.5–5.1)
Sodium: 133 mmol/L — ABNORMAL LOW (ref 135–145)
Total Bilirubin: 0.6 mg/dL (ref 0.3–1.2)
Total Protein: 7 g/dL (ref 6.5–8.1)

## 2015-07-26 LAB — URINE MICROSCOPIC-ADD ON

## 2015-07-26 LAB — URINALYSIS, ROUTINE W REFLEX MICROSCOPIC
Bilirubin Urine: NEGATIVE
Glucose, UA: NEGATIVE mg/dL
KETONES UR: NEGATIVE mg/dL
LEUKOCYTES UA: NEGATIVE
Nitrite: NEGATIVE
PROTEIN: 30 mg/dL — AB
Specific Gravity, Urine: 1.017 (ref 1.005–1.030)
pH: 5.5 (ref 5.0–8.0)

## 2015-07-26 LAB — CBC
HCT: 23.1 % — ABNORMAL LOW (ref 39.0–52.0)
Hemoglobin: 7 g/dL — ABNORMAL LOW (ref 13.0–17.0)
MCH: 26.2 pg (ref 26.0–34.0)
MCHC: 30.3 g/dL (ref 30.0–36.0)
MCV: 86.5 fL (ref 78.0–100.0)
PLATELETS: 354 10*3/uL (ref 150–400)
RBC: 2.67 MIL/uL — ABNORMAL LOW (ref 4.22–5.81)
RDW: 14 % (ref 11.5–15.5)
WBC: 12.8 10*3/uL — AB (ref 4.0–10.5)

## 2015-07-26 LAB — GLUCOSE, CAPILLARY
GLUCOSE-CAPILLARY: 164 mg/dL — AB (ref 65–99)
Glucose-Capillary: 167 mg/dL — ABNORMAL HIGH (ref 65–99)
Glucose-Capillary: 137 mg/dL — ABNORMAL HIGH (ref 65–99)
Glucose-Capillary: 214 mg/dL — ABNORMAL HIGH (ref 65–99)

## 2015-07-26 LAB — SODIUM, URINE, RANDOM: Sodium, Ur: 28 mmol/L

## 2015-07-26 LAB — CREATININE, URINE, RANDOM: CREATININE, URINE: 106.21 mg/dL

## 2015-07-26 SURGERY — ABDOMINAL AORTOGRAM W/LOWER EXTREMITY

## 2015-07-26 MED ORDER — HYDRALAZINE HCL 20 MG/ML IJ SOLN
10.0000 mg | Freq: Four times a day (QID) | INTRAMUSCULAR | Status: DC | PRN
  Administered 2015-08-04 – 2015-08-06 (×2): 10 mg via INTRAVENOUS
  Filled 2015-07-26 (×2): qty 1

## 2015-07-26 MED ORDER — FAMOTIDINE 20 MG PO TABS
20.0000 mg | ORAL_TABLET | Freq: Two times a day (BID) | ORAL | Status: DC
  Administered 2015-07-26 – 2015-07-28 (×5): 20 mg via ORAL
  Filled 2015-07-26 (×5): qty 1

## 2015-07-26 MED ORDER — TAMSULOSIN HCL 0.4 MG PO CAPS
0.4000 mg | ORAL_CAPSULE | Freq: Every day | ORAL | Status: DC
  Administered 2015-07-26 – 2015-08-10 (×15): 0.4 mg via ORAL
  Filled 2015-07-26 (×15): qty 1

## 2015-07-26 NOTE — Progress Notes (Signed)
PROGRESS NOTE    Cory Weber  [id number]  DOB: May 25, 1950  DOA: 07/23/2015 PCP: Maggie Font, MD Outpatient Specialists:  Hospital course: Cory Weber is a 65 y.o. male with past medical history significant for diabetes mellitus type 2, hypertension, hyperlipidemia and tobacco abuse who was recently admitted for left foot cellulitis after patient had immeresed his foot in hot water. But due to his neuropathy he did not realize how hot the water was. At that time patient had blister on his right great toe which gone down but developed wound around the great toe. Patient was admitted and placed on antibiotics and vascular surgeon was consulted and ABI was 0.4. Patient was referred to [facility] surgeon as outpatient with Dr. Bridgett Larsson. Patient states his pain worsened last 2-3 days after discharge with new blisters forming on the dorsal aspect of his left foot around the lateral toes. There is also gangrenous appearing left toe. Patient is mildly tachycardic and has leukocytosis. On-call vascular surgeon Dr. Trula Slade at [name] was consulted by the ER physician. Patient transferred to [facility] for further management of his cellulitis and peripheral vascular disease. Patient is agreeable to transfer. Patient also has anemia and during last admission was noted to have stool for occult blood positive. Patient was taking NSAIDs and was instructed to stop and was advised further GI workup as outpatient.  Assessment & Plan:   Cellulitis of the left foot with vascular disease - patient has been placed on vancomycin and Zosyn. vascular surgery consulted. Vascular lab rescheduled procedure to 6/16.  Continue pain medications and fluids.   Normocytic normochromic anemia - recent admission patient had stool for occult blood positive. Patient was instructed to stop taking NSAIDs. Closely follow CBC. type and screen.  - Pepcid.   Diabetes mellitus type 2 with peripheral vascular disease - hold  metformin and sulfonylurea while inpatient. Patient is on sliding scale coverage. Closely follow CBGs.  Hypertension - d/c lisinopril. Hold hydrochlorothiazide since patient is receiving IV fluids.  Hyperlipidemia on statins.   Tobacco abuse - tobacco cessation counseling requested.  Marland Kitchen History of hepatitis C with possible cirrhosis and has undergone complete treatment for hepatitis C.  Chronic kidney disease stage III -has > 400 cc on PVR-- will start flomax and insert foley -U/A, FENA Renal U/S -recheck BMP in AM  DVT prophylaxis: SCDs. Code Status: Full code.  Family Communication: discussed with daughter from DC at bedside Disposition Plan: Home.  Consults called: vascular surgery  Admission status: Inpatient. MedSurg. Likely stay 3-4 days.   Consultants:  Vascular surgery  Procedures:  Antimicrobials: Anti-infectives    Start     Dose/Rate Route Frequency Ordered Stop   07/26/15 1100  cefUROXime (ZINACEF) 1.5 g in dextrose 5 % 50 mL IVPB     1.5 g 100 mL/hr over 30 Minutes Intravenous To ShortStay Surgical 07/25/15 1153 07/27/15 1100   07/25/15 1230  vancomycin (VANCOCIN) 1,500 mg in sodium chloride 0.9 % 500 mL IVPB     1,500 mg 250 mL/hr over 120 Minutes Intravenous Every 24 hours 07/25/15 1152     07/25/15 0600  cefUROXime (ZINACEF) 1.5 g in dextrose 5 % 50 mL IVPB  Status:  Discontinued     1.5 g 100 mL/hr over 30 Minutes Intravenous To ShortStay Surgical 07/24/15 1322 07/25/15 1153   07/24/15 1800  vancomycin (VANCOCIN) 1,500 mg in sodium chloride 0.9 % 500 mL IVPB  Status:  Discontinued     1,500 mg 250 mL/hr over  120 Minutes Intravenous Every 24 hours 07/23/15 2104 07/25/15 1152   07/24/15 0030  piperacillin-tazobactam (ZOSYN) IVPB 3.375 g     3.375 g 12.5 mL/hr over 240 Minutes Intravenous Every 8 hours 07/23/15 2104     07/23/15 2115  vancomycin (VANCOCIN) IVPB 1000 mg/200 mL premix     1,000 mg 200 mL/hr over 60 Minutes Intravenous NOW 07/23/15  2104 07/23/15 2342   07/23/15 1545  piperacillin-tazobactam (ZOSYN) IVPB 3.375 g     3.375 g 12.5 mL/hr over 240 Minutes Intravenous  Once 07/23/15 1544 07/23/15 1709   07/23/15 1545  vancomycin (VANCOCIN) IVPB 1000 mg/200 mL premix     1,000 mg 200 mL/hr over 60 Minutes Intravenous  Once 07/23/15 1544 07/23/15 1812     Subjective: Foot still hurts Eating well   Objective: Filed Vitals:   07/25/15 1500 07/25/15 2104 07/26/15 0536 07/26/15 0906  BP: 131/77 137/85 161/81 131/73  Pulse: 86 100 99 91  Temp: 99.1 F (37.3 C) 99.7 F (37.6 C) 99.2 F (37.3 C)   TempSrc:  Oral Oral   Resp: 16 18 18    Height:      Weight:      SpO2: 97% 100% 100%     Intake/Output Summary (Last 24 hours) at 07/26/15 1408 Last data filed at 07/26/15 1158  Gross per 24 hour  Intake    240 ml  Output   1400 ml  Net  -1160 ml   Filed Weights   07/23/15 1453  Weight: 90.266 kg (199 lb)    Exam: Respiratory: No rhonchi or palpitations. Cardiovascular: normal S1-S2 heard. Abdomen: Soft nontender bowel sounds present. Musculoskeletal: toes covered but redness appears decreased Neurologic: alert awake oriented to time place and person. Moves all extremities. Psychiatric: normal mood/affect  Data Reviewed: Basic Metabolic Panel:  Recent Labs Lab 07/23/15 1548 07/24/15 0313 07/25/15 0519 07/26/15 0431  NA 131* 131* 132* 133*  K 3.7 3.7 3.6 3.8  CL 94* 95* 97* 100*  CO2 27 29 25 25   GLUCOSE 172* 231* 148* 162*  BUN 21* 15 13 14   CREATININE 1.52* 1.54* 1.72* 1.87*  CALCIUM 8.9 8.5* 8.6* 8.4*   Liver Function Tests:  Recent Labs Lab 07/24/15 0313 07/26/15 0431  AST 44* 54*  ALT 33 65*  ALKPHOS 47 68  BILITOT 0.5 0.6  PROT 7.1 7.0  ALBUMIN 2.3* 2.3*   No results for input(s): LIPASE, AMYLASE in the last 168 hours. No results for input(s): AMMONIA in the last 168 hours. CBC:  Recent Labs Lab 07/23/15 1548 07/24/15 0313 07/25/15 0519 07/26/15 0431  WBC 16.1* 13.1*  12.4* 12.8*  NEUTROABS 13.6* 10.4*  --   --   HGB 8.5* 7.3* 7.2* 7.0*  HCT 26.8* 23.9* 23.7* 23.1*  MCV 87.3 85.4 85.9 86.5  PLT 326 306 308 354   Cardiac Enzymes: No results for input(s): CKTOTAL, CKMB, CKMBINDEX, TROPONINI in the last 168 hours. BNP (last 3 results) No results for input(s): PROBNP in the last 8760 hours. CBG:  Recent Labs Lab 07/25/15 1133 07/25/15 1652 07/25/15 2202 07/26/15 0538 07/26/15 1147  GLUCAP 141* 223* 253* 164* 167*    Recent Results (from the past 240 hour(s))  Culture, blood (routine x 2)     Status: None   Collection Time: 07/18/15  1:30 PM  Result Value Ref Range Status   Specimen Description BLOOD LEFT ANTECUBITAL  Final   Special Requests BOTTLES DRAWN AEROBIC AND ANAEROBIC 10CC EACH  Final   Culture NO GROWTH  5 DAYS  Final   Report Status 07/23/2015 FINAL  Final  Culture, blood (routine x 2)     Status: None   Collection Time: 07/18/15  1:35 PM  Result Value Ref Range Status   Specimen Description BLOOD LEFT ARM DRAWN BY RN  Final   Special Requests BOTTLES DRAWN AEROBIC AND ANAEROBIC 5CC EACH  Final   Culture NO GROWTH 5 DAYS  Final   Report Status 07/23/2015 FINAL  Final  Blood culture (routine x 2)     Status: None (Preliminary result)   Collection Time: 07/23/15  3:51 PM  Result Value Ref Range Status   Specimen Description BLOOD LEFT ANTECUBITAL  Final   Special Requests BOTTLES DRAWN AEROBIC AND ANAEROBIC 4CC EACH  Final   Culture NO GROWTH 3 DAYS  Final   Report Status PENDING  Incomplete  Blood culture (routine x 2)     Status: None (Preliminary result)   Collection Time: 07/23/15  4:30 PM  Result Value Ref Range Status   Specimen Description BLOOD SITE NOT SPECIFIED DRAWN BY RN  Final   Special Requests BOTTLES DRAWN AEROBIC AND ANAEROBIC 8CC EACH  Final   Culture NO GROWTH 3 DAYS  Final   Report Status PENDING  Incomplete    Studies: No results found. Scheduled Meds: . cefUROXime (ZINACEF)  IV  1.5 g Intravenous To  SS-Surg  . famotidine  20 mg Oral BID  . gabapentin  300 mg Oral BID  . insulin aspart  0-9 Units Subcutaneous TID WC  . piperacillin-tazobactam (ZOSYN)  IV  3.375 g Intravenous Q8H  . simvastatin  20 mg Oral q1800  . tamsulosin  0.4 mg Oral Daily  . vancomycin  1,500 mg Intravenous Q24H   Continuous Infusions: . sodium chloride 75 mL/hr at 07/25/15 1143   Principal Problem:   Cellulitis L foot Active Problems:   Peripheral vascular disease, unspecified ([place])   Essential hypertension   Left foot infection   Type 2 diabetes mellitus with vascular disease ([facility])   Normocytic normochromic anemia   Cellulitis of left foot  Time spent:   Geradine Girt, DO [facility] Pager [phone]  If 7PM-7AM, please contact night-coverage [email or web address] Password [email or web address] 07/26/2015, 2:08 PM    LOS: 3 days

## 2015-07-26 NOTE — Progress Notes (Signed)
Patient's post void bladder scan measurement was 492cc. Patient states that it is because he is unable to stand up fully to empty is bladder. Will notify MD Vann.

## 2015-07-26 NOTE — Progress Notes (Signed)
Foley inserted as ordered. Delia Heady RN

## 2015-07-26 NOTE — Progress Notes (Signed)
Procedure cancelled again today due to increased creatinine.  Continue with IV hydration.  Will schedule for Friday  [facility]

## 2015-07-26 NOTE — Progress Notes (Signed)
Pharmacy Antibiotic Note  Cory Weber is a 65 y.o. male admitted on 07/23/2015 with cellulitis.  Pharmacy has been consulted for vanc/zosyn dosing. Vascular Surgery plan for angiogram but has been canceled x 2 days d/t rising creatinine. Plan now is for Friday. CrCl~46.  Patient did not receive a vancomycin dose on 6/12, not sure why as it was ordered and showing up on the [facility]. Rescheduled dose yesterday to start right away.  Plan: Zosyn 3.375g IV q8h (4h inf) Vancomycin 1500mg  IV q24h Monitor clinical progress, c/s, renal function, abx plan/LOT VT@SS  as indicated   Height: 6\' 1"  (185.4 cm) Weight: 199 lb (90.266 kg) IBW/kg (Calculated) : 79.9  Temp (24hrs), Avg:99.3 F (37.4 C), Min:99.1 F (37.3 C), Max:99.7 F (37.6 C)   Recent Labs Lab 07/23/15 1548 07/23/15 1634 07/24/15 0313 07/25/15 0519 07/26/15 0431  WBC 16.1*  --  13.1* 12.4* 12.8*  CREATININE 1.52*  --  1.54* 1.72* 1.87*  LATICACIDVEN  --  1.24  --   --   --     Estimated Creatinine Clearance: 46.3 mL/min (by C-G formula based on Cr of 1.87).    No Known Allergies  Antimicrobials this admission: 6/11 vanc >>  6/11 zosyn >>   Dose adjustments this admission:   Microbiology results: 6/11 BCx: ngtd  Thank you for allowing [place] to participate in this patients care. Jens Som, PharmD Pager: [phone]  07/26/2015 2:05 PM

## 2015-07-27 ENCOUNTER — Inpatient Hospital Stay (HOSPITAL_COMMUNITY): Payer: Medicaid Other

## 2015-07-27 ENCOUNTER — Encounter (HOSPITAL_COMMUNITY): Payer: Self-pay | Admitting: General Practice

## 2015-07-27 DIAGNOSIS — I70244 Atherosclerosis of native arteries of left leg with ulceration of heel and midfoot: Secondary | ICD-10-CM

## 2015-07-27 LAB — BASIC METABOLIC PANEL
ANION GAP: 8 (ref 5–15)
BUN: 14 mg/dL (ref 6–20)
CALCIUM: 8.6 mg/dL — AB (ref 8.9–10.3)
CO2: 26 mmol/L (ref 22–32)
Chloride: 102 mmol/L (ref 101–111)
Creatinine, Ser: 1.91 mg/dL — ABNORMAL HIGH (ref 0.61–1.24)
GFR calc Af Amer: 42 mL/min — ABNORMAL LOW (ref >60)
GFR, EST NON AFRICAN AMERICAN: 36 mL/min — AB (ref >60)
GLUCOSE: 214 mg/dL — AB (ref 65–99)
Potassium: 3.8 mmol/L (ref 3.5–5.1)
SODIUM: 136 mmol/L (ref 135–145)

## 2015-07-27 LAB — GLUCOSE, CAPILLARY
GLUCOSE-CAPILLARY: 181 mg/dL — AB (ref 65–99)
GLUCOSE-CAPILLARY: 231 mg/dL — AB (ref 65–99)
Glucose-Capillary: 164 mg/dL — ABNORMAL HIGH (ref 65–99)
Glucose-Capillary: 139 mg/dL — ABNORMAL HIGH (ref 65–99)

## 2015-07-27 MED ORDER — DEXTROSE 5 % IV SOLN
1.0000 g | INTRAVENOUS | Status: DC
  Administered 2015-07-27 – 2015-08-09 (×13): 1 g via INTRAVENOUS
  Filled 2015-07-27 (×15): qty 10

## 2015-07-27 MED ORDER — INSULIN GLARGINE 100 UNIT/ML ~~LOC~~ SOLN
10.0000 [IU] | Freq: Every day | SUBCUTANEOUS | Status: DC
  Administered 2015-07-27 – 2015-07-29 (×3): 10 [IU] via SUBCUTANEOUS
  Filled 2015-07-27 (×3): qty 0.1

## 2015-07-27 NOTE — Progress Notes (Signed)
PROGRESS NOTE    C[facility]  [id number]  DOB: 09/05/1950  DOA: 07/23/2015 PCP: Maggie Font, MD Outpatient Specialists:  [facility] course: [facility] is a 65 y.o. male with past medical history significant for diabetes mellitus type 2, hypertension, hyperlipidemia and tobacco abuse who was recently admitted for left foot cellulitis after patient had immeresed his foot in hot water. But due to his neuropathy he did not realize how hot the water was. At that time patient had blister on his right great toe which gone down but developed wound around the great toe. Patient was admitted and placed on antibiotics and vascular surgeon was consulted and ABI was 0.4. Patient was referred to [facility] as outpatient with Dr. Bridgett Larsson. Patient states his pain worsened last 2-3 days after discharge with new blisters forming on the dorsal aspect of his left foot around the lateral toes. There is also gangrenous appearing left toe. Patient is mildly tachycardic and has leukocytosis. On-call vascular surgeon Dr. Trula Slade at [facility] was consulted by the ER physician. Patient transferred to [facility] for further management of his cellulitis and peripheral vascular disease. Patient is agreeable to transfer. Patient also has anemia and during last admission was noted to have stool for occult blood positive. Patient was taking NSAIDs and was instructed to stop and was advised further GI workup as outpatient.  Assessment & Plan:   Cellulitis of the left foot with vascular disease - patient has been placed on vancomycin and Zosyn- change to rocephin -vascular surgery consulted: rescheduled procedure to 6/16 due to Cr.   Normocytic normochromic anemia - recent admission patient had stool for occult blood positive. Patient was instructed to stop taking NSAIDs. Closely follow CBC. type and screen.  - Pepcid.   Diabetes mellitus type 2 with peripheral vascular disease - hold metformin and sulfonylurea  while inpatient. Patient is on sliding scale coverage. Closely follow CBGs. -low dose lantus for better blood sugar control  Hypertension - d/c lisinopril. Hold hydrochlorothiazide since patient is receiving IV fluids. - controlled  Hyperlipidemia on statins.   Tobacco abuse - tobacco cessation counseling requested.  Marland Kitchen History of hepatitis C with possible cirrhosis and has undergone complete treatment for hepatitis C.  Chronic kidney disease stage III -has > 400 cc on PVR-- will start flomax and insert foley -U/A, FENA- pre renal? Renal U/S- medical disease -recheck BMP in AM- ? Volume overload-- renal consult for optimization    DVT prophylaxis: SCDs. Code Status: Full code.  Family Communication: discussed with daughter at bedside Disposition Plan: Home.  Consults called: vascular surgery  Admission status: Inpatient. MedSurg. Likely stay 3-4 days.   Consultants:  Vascular surgery  Procedures:  Antimicrobials: Anti-infectives    Start     Dose/Rate Route Frequency Ordered Stop   07/27/15 1300  cefTRIAXone (ROCEPHIN) 1 g in dextrose 5 % 50 mL IVPB     1 g 100 mL/hr over 30 Minutes Intravenous Every 24 hours 07/27/15 1206     07/26/15 1100  cefUROXime (ZINACEF) 1.5 g in dextrose 5 % 50 mL IVPB  Status:  Discontinued     1.5 g 100 mL/hr over 30 Minutes Intravenous To ShortStay Surgical 07/25/15 1153 07/27/15 1206   07/25/15 1230  vancomycin (VANCOCIN) 1,500 mg in sodium chloride 0.9 % 500 mL IVPB  Status:  Discontinued     1,500 mg 250 mL/hr over 120 Minutes Intravenous Every 24 hours 07/25/15 1152 07/27/15 1206   07/25/15 0600  cefUROXime (ZINACEF)  1.5 g in dextrose 5 % 50 mL IVPB  Status:  Discontinued     1.5 g 100 mL/hr over 30 Minutes Intravenous To ShortStay Surgical 07/24/15 1322 07/25/15 1153   07/24/15 1800  vancomycin (VANCOCIN) 1,500 mg in sodium chloride 0.9 % 500 mL IVPB  Status:  Discontinued     1,500 mg 250 mL/hr over 120 Minutes Intravenous  Every 24 hours 07/23/15 2104 07/25/15 1152   07/24/15 0030  piperacillin-tazobactam (ZOSYN) IVPB 3.375 g  Status:  Discontinued     3.375 g 12.5 mL/hr over 240 Minutes Intravenous Every 8 hours 07/23/15 2104 07/27/15 1206   07/23/15 2115  vancomycin (VANCOCIN) IVPB 1000 mg/200 mL premix     1,000 mg 200 mL/hr over 60 Minutes Intravenous NOW 07/23/15 2104 07/23/15 2342   07/23/15 1545  piperacillin-tazobactam (ZOSYN) IVPB 3.375 g     3.375 g 12.5 mL/hr over 240 Minutes Intravenous  Once 07/23/15 1544 07/23/15 1709   07/23/15 1545  vancomycin (VANCOCIN) IVPB 1000 mg/200 mL premix     1,000 mg 200 mL/hr over 60 Minutes Intravenous  Once 07/23/15 1544 07/23/15 1812     Subjective: Ready to get procedure done  Objective: Filed Vitals:   07/26/15 0906 07/26/15 1330 07/26/15 1944 07/27/15 0500  BP: 131/73 136/71 142/82 136/64  Pulse: 91 77 114 97  Temp:  100.1 F (37.8 C) 100.9 F (38.3 C) 100 F (37.8 C)  TempSrc:   Oral Oral  Resp:  18 18 18   Height:      Weight:      SpO2:  100% 86% 97%    Intake/Output Summary (Last 24 hours) at 07/27/15 1343 Last data filed at 07/27/15 0346  Gross per 24 hour  Intake    240 ml  Output   1400 ml  Net  -1160 ml   Filed Weights   07/23/15 1453  Weight: 90.266 kg (199 lb)    Exam: Respiratory: No rhonchi or palpitations. Cardiovascular: normal S1-S2 heard. Abdomen: Soft nontender bowel sounds present. Musculoskeletal: toes covered but redness appears decreased Neurologic: alert awake oriented to time place and person. Moves all extremities. Psychiatric: normal mood/affect  Data Reviewed: Basic Metabolic Panel:  Recent Labs Lab 07/23/15 1548 07/24/15 0313 07/25/15 0519 07/26/15 0431 07/27/15 1029  NA 131* 131* 132* 133* 136  K 3.7 3.7 3.6 3.8 3.8  CL 94* 95* 97* 100* 102  CO2 27 29 25 25 26   GLUCOSE 172* 231* 148* 162* 214*  BUN 21* 15 13 14 14   CREATININE 1.52* 1.54* 1.72* 1.87* 1.91*  CALCIUM 8.9 8.5* 8.6* 8.4*  8.6*   Liver Function Tests:  Recent Labs Lab 07/24/15 0313 07/26/15 0431  AST 44* 54*  ALT 33 65*  ALKPHOS 47 68  BILITOT 0.5 0.6  PROT 7.1 7.0  ALBUMIN 2.3* 2.3*   No results for input(s): LIPASE, AMYLASE in the last 168 hours. No results for input(s): AMMONIA in the last 168 hours. CBC:  Recent Labs Lab 07/23/15 1548 07/24/15 0313 07/25/15 0519 07/26/15 0431  WBC 16.1* 13.1* 12.4* 12.8*  NEUTROABS 13.6* 10.4*  --   --   HGB 8.5* 7.3* 7.2* 7.0*  HCT 26.8* 23.9* 23.7* 23.1*  MCV 87.3 85.4 85.9 86.5  PLT 326 306 308 354   Cardiac Enzymes: No results for input(s): CKTOTAL, CKMB, CKMBINDEX, TROPONINI in the last 168 hours. BNP (last 3 results) No results for input(s): PROBNP in the last 8760 hours. CBG:  Recent Labs Lab 07/26/15 1147 07/26/15 1617 07/26/15  2009 07/27/15 0435 07/27/15 1204  GLUCAP 167* 137* 214* 181* 231*    Recent Results (from the past 240 hour(s))  Culture, blood (routine x 2)     Status: None   Collection Time: 07/18/15  1:30 PM  Result Value Ref Range Status   Specimen Description BLOOD LEFT ANTECUBITAL  Final   Special Requests BOTTLES DRAWN AEROBIC AND ANAEROBIC 10CC EACH  Final   Culture NO GROWTH 5 DAYS  Final   Report Status 07/23/2015 FINAL  Final  Culture, blood (routine x 2)     Status: None   Collection Time: 07/18/15  1:35 PM  Result Value Ref Range Status   Specimen Description BLOOD LEFT ARM DRAWN BY RN  Final   Special Requests BOTTLES DRAWN AEROBIC AND ANAEROBIC 5CC EACH  Final   Culture NO GROWTH 5 DAYS  Final   Report Status 07/23/2015 FINAL  Final  Blood culture (routine x 2)     Status: None (Preliminary result)   Collection Time: 07/23/15  3:51 PM  Result Value Ref Range Status   Specimen Description BLOOD LEFT ANTECUBITAL  Final   Special Requests BOTTLES DRAWN AEROBIC AND ANAEROBIC 4CC EACH  Final   Culture NO GROWTH 4 DAYS  Final   Report Status PENDING  Incomplete  Blood culture (routine x 2)     Status:  None (Preliminary result)   Collection Time: 07/23/15  4:30 PM  Result Value Ref Range Status   Specimen Description BLOOD SITE NOT SPECIFIED DRAWN BY RN  Final   Special Requests BOTTLES DRAWN AEROBIC AND ANAEROBIC 8CC EACH  Final   Culture NO GROWTH 4 DAYS  Final   Report Status PENDING  Incomplete    Studies: US Renal  07/27/2015  CLINICAL DATA:  Acute kidney injury EXAM: RENAL / URINARY TRACT ULTRASOUND COMPLETE COMPARISON:  None. FINDINGS: Right Kidney: Length: 12.6 cm. Mildly increased echogenicity. No hydronephrosis or mass. Left Kidney: Length: 12.6 cm. Mildly increased echogenicity. No hydronephrosis or mass. Bladder: Not evaluated.  Decompressed by Foley catheter. IMPRESSION: Evidence of medical renal disease Electronically Signed   By: Skipper Cliche M.D.   On: 07/27/2015 11:21   Scheduled Meds: . cefTRIAXone (ROCEPHIN)  IV  1 g Intravenous Q24H  . famotidine  20 mg Oral BID  . gabapentin  300 mg Oral BID  . insulin aspart  0-9 Units Subcutaneous TID WC  . simvastatin  20 mg Oral q1800  . tamsulosin  0.4 mg Oral Daily   Continuous Infusions: . sodium chloride 75 mL/hr at 07/25/15 1143   Principal Problem:   Cellulitis L foot Active Problems:   Peripheral vascular disease, unspecified ([place])   Essential hypertension   Left foot infection   Type 2 diabetes mellitus with vascular disease (HCC)   Normocytic normochromic anemia   Cellulitis of left foot  Time spent:   Geradine Girt, DO [facility] Pager [phone]  If 7PM-7AM, please contact night-coverage [email or web address] Password [email or web address] 07/27/2015, 1:43 PM    LOS: 4 days

## 2015-07-27 NOTE — Consult Note (Signed)
[facility] Renal Consultation Note  Requesting MD: Eliseo Squires Indication for Consultation: A on CRF   HPI:  [facility] is a 65 y.o. male with past medical history significant for type 2 diabetes mellitus (says diagnosed only 3 years but had before that- has not seen an eyeMD) hypertension, hyperlipidemia, tobacco abuse.  He was admitted from 6/6 to 6/8 for a foot ulcer with cellulitis - treated with IV vancomycin, had an ABI of 0.4.  He was discharged with an outpatient follow-up appointment with vascular.  He then re-presented on 6/11 with the same complaint and was transferred to [facility] for definitive management.  An angiogram was ordered but not done secondary to a rise in creatinine from 1.5-1.7 and then canceled again today for creatinine 1.9. Regarding his kidney function it had been relatively normal around 1.0 up until October 2015. There then as a creatinine in the system of 1.27 in February 2016. In December 2016 creatinine was 1.5 during his first hospitalization creatinine ranged from 1.3-1.5 but during his second he's had daily small rises from 1.5-1.7 and today 1.9.  Of note, he has been on IV vancomycin that was discontinued today but there is no level value in the system - also, patient was on lisinopril that was just stopped yesterday .  There've been no abnormally low blood pressures noted in the chart.  A urinalysis from 614 shows granular casts,  30 of protein and too numerous to count RBCs (? Due to foley cath placed for BOO sxms).  Renal ultrasound shows 12 cm kidneys with no hydronephrosis.  Urine output seems adequate.  He admits to be taking ibuprofen 800 mg very frequently just prior to his first hospitalization and in between his first and second hospitalization. He had one kidney stone when he was a teenager but none since. He describes frequent urinary tract infections as a child but not recently.    CREAT  Date/Time Value Ref Range Status  10/31/2011  01:19 PM 0.99  Final   CREATININE, SER  Date/Time Value Ref Range Status  07/27/2015 10:29 AM 1.91* 0.61 - 1.24 mg/dL Final  07/26/2015 04:31 AM 1.87* 0.61 - 1.24 mg/dL Final  07/25/2015 05:19 AM 1.72* 0.61 - 1.24 mg/dL Final  07/24/2015 03:13 AM 1.54* 0.61 - 1.24 mg/dL Final  07/23/2015 03:48 PM 1.52* 0.61 - 1.24 mg/dL Final  07/19/2015 06:25 AM 1.32* 0.61 - 1.24 mg/dL Final  07/18/2015 01:30 PM 1.43* 0.61 - 1.24 mg/dL Final  02/06/2015 09:35 AM 1.56* 0.61 - 1.24 mg/dL Final  04/01/2014 01:38 PM 1.27 0.50 - 1.35 mg/dL Final  11/30/2013 07:35 AM 0.99 0.50 - 1.35 mg/dL Final  11/29/2013 05:37 AM 0.94 0.50 - 1.35 mg/dL Final  11/28/2013 06:45 AM 1.07 0.50 - 1.35 mg/dL Final  11/27/2013 04:19 AM 1.18 0.50 - 1.35 mg/dL Final  11/26/2013 05:10 AM 1.08 0.50 - 1.35 mg/dL Final  11/25/2013 05:49 AM 0.89 0.50 - 1.35 mg/dL Final  11/23/2013 12:11 PM 0.97 0.50 - 1.35 mg/dL Final  12/02/2012 02:30 PM 0.96 0.50 - 1.35 mg/dL Final  09/01/2012 02:00 PM 1.00 0.50 - 1.35 mg/dL Final  07/28/2012 01:49 PM 1.11 0.50 - 1.35 mg/dL Final  07/31/2011 10:18 AM 0.97 0.50 - 1.35 mg/dL Final  02/07/2011 04:54 AM 0.89 0.50 - 1.35 mg/dL Final  02/06/2011 06:11 AM 0.88 0.50 - 1.35 mg/dL Final  02/05/2011 05:45 AM 0.90 0.50 - 1.35 mg/dL Final  02/03/2011 05:34 PM 0.90 0.50 - 1.35 mg/dL Final  03/29/2008 09:05 AM  0.82 0.4 - 1.5 mg/dL Final     PMHx:   Past Medical History  Diagnosis Date  . Diabetes mellitus   . Hypertension   . Hepatitis C     states he was diagnosed in 2007 or 2007 while living in [place], [place]  . CVA (cerebral infarction) 01/2011  . Hyperlipidemia   . Asthma   . PVD (peripheral vascular disease) ([place])   . Anemia   . Stroke ([place])   . Headache(784.0)   . Peripheral neuropathy ([place])   . Cellulitis and abscess of foot 07/2015    Past Surgical History  Procedure Laterality Date  . Liver biopsy  2005    Done in [place], [place]. Chronic hepatitis with mild periportal inflammation,  lobular unicellular necrosis and portal fibrosis. Grade 2, stage 1-2.  Marland Kitchen Colonoscopy with propofol N/A 09/03/2012    FHL:[id number] polyp-removed as outlined above. Prominent internal hemorrhoids. Tubular adenoma  . Esophagogastroduodenoscopy (egd) with propofol N/A 09/03/2012    SLH:TDSKAJ hernia. Gastric diverticulum. Gastric ulcers with associated erosions. Duodenal erosions. Status post gastric biopsy. H.PYLORI gastritis   . Biopsy N/A 09/03/2012    Procedure: BIOPSY;  Surgeon: Daneil Dolin, MD;  Location: [facility];  Service: Endoscopy;  Laterality: N/A;  gastric and gastric mucosa  . Polypectomy N/A 09/03/2012    Procedure: POLYPECTOMY;  Surgeon: Daneil Dolin, MD;  Location: [facility];  Service: Endoscopy;  Laterality: N/A;  cecal polyp  . Esophagogastroduodenoscopy (egd) with propofol N/A 12/03/2012    Dr. Gala Romney: gastric diverticulum, gastric erosions and scar. Previously noted gastric ulcer completed healed. Biopsy without H.pylori.   . Biopsy N/A 12/03/2012    Procedure: BIOPSY;  Surgeon: Daneil Dolin, MD;  Location: [facility];  Service: Endoscopy;  Laterality: N/A;  . Maximum access (mas)posterior lumbar interbody fusion (plif) 1 level N/A 11/25/2013    Procedure: FOR MAXIMUM ACCESS (MAS) POSTERIOR LUMBAR INTERBODY FUSION (PLIF) 1 LEVEL;  Surgeon: Eustace Moore, MD;  Location: [place] [facility];  Service: Neurosurgery;  Laterality: N/A;  FOR MAXIMUM ACCESS (MAS) POSTERIOR LUMBAR INTERBODY FUSION (PLIF) 1 LEVEL LUMBAR 3-4    Family Hx:  Family History  Problem Relation Age of Onset  . Breast cancer Mother     deceased  . Cancer Mother   . Diabetes Father   . Hypertension Father   . Heart disease Father     deceased  . Hyperlipidemia Father   . Diabetes Sister   . Hypertension Sister   . Breast cancer Sister   . Colon cancer Neg Hx   . Liver disease Neg Hx     Social History:  reports that he has quit smoking. His smoking use included Cigarettes. He has a 10 pack-year smoking history. He  has never used smokeless tobacco. He reports that he does not drink alcohol or use illicit drugs.  Allergies: No Known Allergies  Medications: Prior to Admission medications   Medication Sig Start Date End Date Taking? Authorizing Provider  doxycycline (VIBRAMYCIN) 50 MG capsule Take 1 capsule (50 mg total) by mouth 2 (two) times daily. 07/20/15  Yes Orvan Falconer, MD  gabapentin (NEURONTIN) 300 MG capsule Take 1 capsule (300 mg total) by mouth 2 (two) times daily. 12/03/13  Yes Daniel J Angiulli, PA-C  glipiZIDE (GLUCOTROL) 5 MG tablet Take 5 mg by mouth 2 (two) times daily before a meal.    Yes Historical Provider, MD  lisinopril-hydrochlorothiazide (PRINZIDE,ZESTORETIC) 20-12.5 MG per tablet Take 1 tablet by mouth 2 (two) times daily.  12/03/13  Yes Daniel J Angiulli, PA-C  metFORMIN (GLUCOPHAGE) 1000 MG tablet Take 1 tablet (1,000 mg total) by mouth 2 (two) times daily with a meal. 12/03/13  Yes Daniel J Angiulli, PA-C  simvastatin (ZOCOR) 20 MG tablet TAKE ONE TABLET BY MOUTH DAILY AT 6 PM. 02/14/15  Yes Herminio Commons, MD  aspirin EC 81 MG tablet Take 1 tablet (81 mg total) by mouth daily. 07/20/15   Orvan Falconer, MD    I have reviewed the patient's current medications.  Labs:  Results for orders placed or performed during the hospital encounter of 07/23/15 (from the past 48 hour(s))  Glucose, capillary     Status: Abnormal   Collection Time: 07/25/15  4:52 PM  Result Value Ref Range   Glucose-Capillary 223 (H) 65 - 99 mg/dL   Comment 1 Repeat Test    Comment 2 Document in Chart   Glucose, capillary     Status: Abnormal   Collection Time: 07/25/15 10:02 PM  Result Value Ref Range   Glucose-Capillary 253 (H) 65 - 99 mg/dL  CBC     Status: Abnormal   Collection Time: 07/26/15  4:31 AM  Result Value Ref Range   WBC 12.8 (H) 4.0 - 10.5 K/uL   RBC 2.67 (L) 4.22 - 5.81 MIL/uL   Hemoglobin 7.0 (L) 13.0 - 17.0 g/dL   HCT 23.1 (L) 39.0 - 52.0 %   MCV 86.5 78.0 - 100.0 fL   MCH 26.2 26.0 - 34.0  pg   MCHC 30.3 30.0 - 36.0 g/dL   RDW 14.0 11.5 - 15.5 %   Platelets 354 150 - 400 K/uL  Comprehensive metabolic panel     Status: Abnormal   Collection Time: 07/26/15  4:31 AM  Result Value Ref Range   Sodium 133 (L) 135 - 145 mmol/L   Potassium 3.8 3.5 - 5.1 mmol/L   Chloride 100 (L) 101 - 111 mmol/L   CO2 25 22 - 32 mmol/L   Glucose, Bld 162 (H) 65 - 99 mg/dL   BUN 14 6 - 20 mg/dL   Creatinine, Ser 1.87 (H) 0.61 - 1.24 mg/dL   Calcium 8.4 (L) 8.9 - 10.3 mg/dL   Total Protein 7.0 6.5 - 8.1 g/dL   Albumin 2.3 (L) 3.5 - 5.0 g/dL   AST 54 (H) 15 - 41 U/L   ALT 65 (H) 17 - 63 U/L   Alkaline Phosphatase 68 38 - 126 U/L   Total Bilirubin 0.6 0.3 - 1.2 mg/dL   GFR calc non Af Amer 37 (L) >60 mL/min   GFR calc Af Amer 43 (L) >60 mL/min    Comment: (NOTE) The eGFR has been calculated using the CKD EPI equation. This calculation has not been validated in all clinical situations. eGFR's persistently <60 mL/min signify possible Chronic Kidney Disease.    Anion gap 8 5 - 15  Glucose, capillary     Status: Abnormal   Collection Time: 07/26/15  5:38 AM  Result Value Ref Range   Glucose-Capillary 164 (H) 65 - 99 mg/dL  Glucose, capillary     Status: Abnormal   Collection Time: 07/26/15 11:47 AM  Result Value Ref Range   Glucose-Capillary 167 (H) 65 - 99 mg/dL   Comment 1 Repeat Test    Comment 2 Document in Chart   Urinalysis, Routine w reflex microscopic (not at [facility])     Status: Abnormal   Collection Time: 07/26/15  3:00 PM  Result Value Ref Range  Color, Urine YELLOW YELLOW   APPearance TURBID (A) CLEAR   Specific Gravity, Urine 1.017 1.005 - 1.030   pH 5.5 5.0 - 8.0   Glucose, UA NEGATIVE NEGATIVE mg/dL   Hgb urine dipstick LARGE (A) NEGATIVE   Bilirubin Urine NEGATIVE NEGATIVE   Ketones, ur NEGATIVE NEGATIVE mg/dL   Protein, ur 30 (A) NEGATIVE mg/dL   Nitrite NEGATIVE NEGATIVE   Leukocytes, UA NEGATIVE NEGATIVE  Sodium, urine, random     Status: None   Collection Time:  07/26/15  3:00 PM  Result Value Ref Range   Sodium, Ur 28 mmol/L  Creatinine, urine, random     Status: None   Collection Time: 07/26/15  3:00 PM  Result Value Ref Range   Creatinine, Urine 106.21 mg/dL  Urine microscopic-add on     Status: Abnormal   Collection Time: 07/26/15  3:00 PM  Result Value Ref Range   Squamous Epithelial / LPF 0-5 (A) NONE SEEN   WBC, UA 0-5 0 - 5 WBC/hpf   RBC / HPF TOO NUMEROUS TO COUNT 0 - 5 RBC/hpf   Bacteria, UA FEW (A) NONE SEEN   Casts GRANULAR CAST (A) NEGATIVE   Urine-Other AMORPHOUS URATES/PHOSPHATES   Glucose, capillary     Status: Abnormal   Collection Time: 07/26/15  4:17 PM  Result Value Ref Range   Glucose-Capillary 137 (H) 65 - 99 mg/dL  Glucose, capillary     Status: Abnormal   Collection Time: 07/26/15  8:09 PM  Result Value Ref Range   Glucose-Capillary 214 (H) 65 - 99 mg/dL  Glucose, capillary     Status: Abnormal   Collection Time: 07/27/15  4:35 AM  Result Value Ref Range   Glucose-Capillary 181 (H) 65 - 99 mg/dL  Basic metabolic panel     Status: Abnormal   Collection Time: 07/27/15 10:29 AM  Result Value Ref Range   Sodium 136 135 - 145 mmol/L   Potassium 3.8 3.5 - 5.1 mmol/L   Chloride 102 101 - 111 mmol/L   CO2 26 22 - 32 mmol/L   Glucose, Bld 214 (H) 65 - 99 mg/dL   BUN 14 6 - 20 mg/dL   Creatinine, Ser 1.91 (H) 0.61 - 1.24 mg/dL   Calcium 8.6 (L) 8.9 - 10.3 mg/dL   GFR calc non Af Amer 36 (L) >60 mL/min   GFR calc Af Amer 42 (L) >60 mL/min    Comment: (NOTE) The eGFR has been calculated using the CKD EPI equation. This calculation has not been validated in all clinical situations. eGFR's persistently <60 mL/min signify possible Chronic Kidney Disease.    Anion gap 8 5 - 15  Glucose, capillary     Status: Abnormal   Collection Time: 07/27/15 12:04 PM  Result Value Ref Range   Glucose-Capillary 231 (H) 65 - 99 mg/dL   Comment 1 Repeat Test    Comment 2 Document in Chart      ROS:  A comprehensive review of  systems was negative except for: Cardiovascular: positive for lower extremity edema Musculoskeletal: positive for Left foot pain due to cellulitis/ulcer  Physical Exam: Filed Vitals:   07/26/15 1944 07/27/15 0500  BP: 142/82 136/64  Pulse: 114 97  Temp: 100.9 F (38.3 C) 100 F (37.8 C)  Resp: 18 18     General: Well-appearing black male. Alert, oriented and in no acute distress  HEENT: pupils are equal round reactive to light, and shock liver motions are intact, mucous members are moist  Neck: mild JVD  Heart: tachycardic  Lungs: decreased breath sounds at the bases  Abdomen: soft, nontender, nondistended  Extremities: left foot was wrapped and not examined. He is pitting edema left greater than right   Skin: warm and dry  Neuro: alert and nonfocal   Assessment/Plan: 65 year old black male with diabetes mellitus including complications. In addition, he has mild CKD at baseline creatinine 1.3 but now with some acute on chronic renal failure in the setting of left foot cellulitis/ulcer, NSAIDs, lisinopril, vancomycin, previous bladder outlet obstruction  1.Renal- acute on chronic renal failure in the setting of above. I believe there is multifactorial cause of his acute on chronic renal failure including NSAIDs, possible ATN on lisinopril, possible vancomycin toxicity as well as bladder outlet obstruction.Marland Kitchen Appropriate actions have been taken. I agree with discontinuing lisinopril as well as vancomycin. He's been receiving IV hydration and now has lower extremity edema so I will discontinue IV fluids. He is no longer receiving any NSAIDs. Urinalysis did show granular casts which indicates kidney hypoperfusion/ATN somewhere along the line. I think that the hematuria was likely secondary to Foley catheter placement and not some new glomerulonephritis but will recheck urinalysis to see if hematuria has cleared.  Don't have anything else to add at this time. I agree that performing an arteriogram  with a rising creatinine could cause additional issues. Patient said he is not in  any hurry and he does not want to do anything that could harm his kidneys more so I think we may need to wait for creatinine to trend down before arteriogram is done which I believe will happen eventually 2. Hypertension/volume  - blood pressure is reasonable actually now on no blood pressure medications. Morphine may be causing some lower blood pressures. Stopping IV fluids should help blood pressure from getting any higher 3. Anemia  - hgb is low and it was during his first admission. It is out of proportion to his relatively mild chronic kidney disease. Supportive care at this time  4. Foot ulcer and cellulitis - now on Rocephin.  I understand that arteriogram is desired and do agree that I would not want to do it with a daily rising creatinine. I do not have a timetable on renal function improvement unfortunately. Would continue to keep checking creatinine daily    Ahava Kissoon A 07/27/2015, 1:56 PM

## 2015-07-28 ENCOUNTER — Encounter (HOSPITAL_COMMUNITY): Admission: RE | Payer: Self-pay | Source: Ambulatory Visit

## 2015-07-28 ENCOUNTER — Ambulatory Visit (HOSPITAL_COMMUNITY): Admission: RE | Admit: 2015-07-28 | Payer: Medicaid Other | Source: Ambulatory Visit | Admitting: Vascular Surgery

## 2015-07-28 LAB — URINE MICROSCOPIC-ADD ON

## 2015-07-28 LAB — CBC
HEMATOCRIT: 21.9 % — AB (ref 39.0–52.0)
Hemoglobin: 6.8 g/dL — CL (ref 13.0–17.0)
MCH: 27 pg (ref 26.0–34.0)
MCHC: 31.1 g/dL (ref 30.0–36.0)
MCV: 86.9 fL (ref 78.0–100.0)
PLATELETS: 442 10*3/uL — AB (ref 150–400)
RBC: 2.52 MIL/uL — ABNORMAL LOW (ref 4.22–5.81)
RDW: 14.1 % (ref 11.5–15.5)
WBC: 14 10*3/uL — AB (ref 4.0–10.5)

## 2015-07-28 LAB — URINALYSIS, ROUTINE W REFLEX MICROSCOPIC
BILIRUBIN URINE: NEGATIVE
Glucose, UA: NEGATIVE mg/dL
KETONES UR: NEGATIVE mg/dL
NITRITE: NEGATIVE
PROTEIN: NEGATIVE mg/dL
Specific Gravity, Urine: 1.015 (ref 1.005–1.030)
pH: 5.5 (ref 5.0–8.0)

## 2015-07-28 LAB — RAPID URINE DRUG SCREEN, HOSP PERFORMED
Amphetamines: NOT DETECTED
Barbiturates: NOT DETECTED
Benzodiazepines: NOT DETECTED
Cocaine: NOT DETECTED
OPIATES: POSITIVE — AB
TETRAHYDROCANNABINOL: NOT DETECTED

## 2015-07-28 LAB — GLUCOSE, CAPILLARY
GLUCOSE-CAPILLARY: 159 mg/dL — AB (ref 65–99)
GLUCOSE-CAPILLARY: 224 mg/dL — AB (ref 65–99)
Glucose-Capillary: 191 mg/dL — ABNORMAL HIGH (ref 65–99)
Glucose-Capillary: 221 mg/dL — ABNORMAL HIGH (ref 65–99)

## 2015-07-28 LAB — BASIC METABOLIC PANEL
Anion gap: 9 (ref 5–15)
BUN: 16 mg/dL (ref 6–20)
CHLORIDE: 102 mmol/L (ref 101–111)
CO2: 26 mmol/L (ref 22–32)
CREATININE: 2.03 mg/dL — AB (ref 0.61–1.24)
Calcium: 8.7 mg/dL — ABNORMAL LOW (ref 8.9–10.3)
GFR calc Af Amer: 39 mL/min — ABNORMAL LOW (ref >60)
GFR, EST NON AFRICAN AMERICAN: 33 mL/min — AB (ref >60)
GLUCOSE: 129 mg/dL — AB (ref 65–99)
POTASSIUM: 4 mmol/L (ref 3.5–5.1)
SODIUM: 137 mmol/L (ref 135–145)

## 2015-07-28 LAB — PREPARE RBC (CROSSMATCH)

## 2015-07-28 SURGERY — ABDOMINAL AORTOGRAM W/LOWER EXTREMITY

## 2015-07-28 MED ORDER — SODIUM CHLORIDE 0.9 % IV SOLN
Freq: Once | INTRAVENOUS | Status: AC
  Administered 2015-07-28: 11:00:00 via INTRAVENOUS

## 2015-07-28 MED ORDER — LORAZEPAM 2 MG/ML IJ SOLN
1.0000 mg | Freq: Four times a day (QID) | INTRAMUSCULAR | Status: DC | PRN
  Administered 2015-07-28 – 2015-07-30 (×3): 1 mg via INTRAVENOUS
  Filled 2015-07-28 (×3): qty 1

## 2015-07-28 MED ORDER — FOLIC ACID 1 MG PO TABS
1.0000 mg | ORAL_TABLET | Freq: Every day | ORAL | Status: DC
  Administered 2015-07-29 – 2015-08-10 (×12): 1 mg via ORAL
  Filled 2015-07-28 (×12): qty 1

## 2015-07-28 MED ORDER — GABAPENTIN 300 MG PO CAPS
300.0000 mg | ORAL_CAPSULE | Freq: Every day | ORAL | Status: DC
  Administered 2015-07-30 – 2015-08-09 (×11): 300 mg via ORAL
  Filled 2015-07-28 (×11): qty 1

## 2015-07-28 MED ORDER — VITAMIN B-1 100 MG PO TABS
100.0000 mg | ORAL_TABLET | Freq: Every day | ORAL | Status: DC
  Administered 2015-07-29 – 2015-08-10 (×12): 100 mg via ORAL
  Filled 2015-07-28 (×12): qty 1

## 2015-07-28 MED ORDER — LORAZEPAM 1 MG PO TABS
1.0000 mg | ORAL_TABLET | Freq: Four times a day (QID) | ORAL | Status: DC | PRN
  Administered 2015-07-30: 1 mg via ORAL
  Filled 2015-07-28: qty 1

## 2015-07-28 MED ORDER — SODIUM CHLORIDE 0.9 % IV SOLN: Freq: Once | INTRAVENOUS | Status: DC

## 2015-07-28 MED ORDER — FAMOTIDINE 20 MG PO TABS
20.0000 mg | ORAL_TABLET | Freq: Every day | ORAL | Status: DC
  Administered 2015-07-29 – 2015-08-10 (×12): 20 mg via ORAL
  Filled 2015-07-28 (×12): qty 1

## 2015-07-28 MED ORDER — POLYETHYLENE GLYCOL 3350 17 G PO PACK
17.0000 g | PACK | Freq: Every day | ORAL | Status: DC
  Administered 2015-07-28 – 2015-08-10 (×10): 17 g via ORAL
  Filled 2015-07-28 (×10): qty 1

## 2015-07-28 MED ORDER — MORPHINE SULFATE (PF) 2 MG/ML IV SOLN
0.5000 mg | INTRAVENOUS | Status: DC | PRN
  Administered 2015-07-28 – 2015-07-29 (×2): 0.5 mg via INTRAVENOUS
  Filled 2015-07-28 (×2): qty 1

## 2015-07-28 MED ORDER — THIAMINE HCL 100 MG/ML IJ SOLN
100.0000 mg | Freq: Every day | INTRAMUSCULAR | Status: DC
  Administered 2015-07-31: 100 mg via INTRAVENOUS
  Filled 2015-07-28 (×2): qty 2

## 2015-07-28 MED ORDER — SILVER SULFADIAZINE 1 % EX CREA
TOPICAL_CREAM | Freq: Every day | CUTANEOUS | Status: DC
  Administered 2015-07-28 – 2015-08-04 (×7): via TOPICAL
  Administered 2015-08-05: 1 via TOPICAL
  Administered 2015-08-06 – 2015-08-10 (×5): via TOPICAL
  Filled 2015-07-28 (×2): qty 20
  Filled 2015-07-28: qty 85

## 2015-07-28 MED ORDER — HYDROCODONE-ACETAMINOPHEN 5-325 MG PO TABS
1.0000 | ORAL_TABLET | ORAL | Status: DC | PRN
  Administered 2015-07-28 – 2015-07-29 (×4): 1 via ORAL
  Filled 2015-07-28 (×4): qty 1

## 2015-07-28 MED ORDER — ADULT MULTIVITAMIN W/MINERALS CH
1.0000 | ORAL_TABLET | Freq: Every day | ORAL | Status: DC
  Administered 2015-07-29 – 2015-08-10 (×12): 1 via ORAL
  Filled 2015-07-28 (×12): qty 1

## 2015-07-28 NOTE — Progress Notes (Signed)
PROGRESS NOTE    Cory Weber  [id number]  DOB: Dec 06, 1950  DOA: 07/23/2015 PCP: Maggie Font, MD Outpatient Specialists:  Hospital course: Cory Weber is a 65 y.o. male with past medical history significant for diabetes mellitus type 2, hypertension, hyperlipidemia and tobacco abuse who was recently admitted for left foot cellulitis after patient had immeresed his foot in hot water. But due to his neuropathy he did not realize how hot the water was. At that time patient had blister on his right great toe which gone down but developed wound around the great toe. Patient was admitted and placed on antibiotics and vascular surgeon was consulted and ABI was 0.4. Patient was referred to [facility] surgeon as outpatient with Dr. Bridgett Larsson. Patient states his pain worsened last 2-3 days after discharge with new blisters forming on the dorsal aspect of his left foot around the lateral toes. There is also gangrenous appearing left toe. Patient is mildly tachycardic and has leukocytosis. On-call vascular surgeon Dr. Trula Slade at [name] was consulted by the ER physician. Patient transferred to [facility] for further management of his cellulitis and peripheral vascular disease. Patient is agreeable to transfer. Patient also has anemia and during last admission was noted to have stool for occult blood positive. Patient was taking NSAIDs and was instructed to stop and was advised further GI workup as outpatient.  Assessment & Plan:   Cellulitis of the left foot with vascular disease - patient has been placed on vancomycin and Zosyn- change to rocephin -vascular surgery consulted: rescheduled procedure to 6/16 due to Cr.   Normocytic normochromic anemia - recent admission patient had stool for occult blood positive. Patient was instructed to stop taking NSAIDs. Closely follow CBC. type and screen.  - Pepcid.   Diabetes mellitus type 2 with peripheral vascular disease - hold metformin and sulfonylurea  while inpatient. Patient is on sliding scale coverage. Closely follow CBGs. -low dose lantus for better blood sugar control  Hypertension - d/c lisinopril. Hold hydrochlorothiazide since patient is receiving IV fluids. - controlled  Hyperlipidemia on statins.   Tobacco abuse - tobacco cessation counseling requested.  Marland Kitchen History of hepatitis C with possible cirrhosis and has undergone complete treatment for hepatitis C.  Chronic kidney disease stage III -has > 400 cc on PVR-- will start flomax and insert foley Renal U/S- medical disease -appreciate nephrology   DVT prophylaxis: SCDs. Code Status: Full code.  Family Communication: discussed with daughter at bedside- lives in [place] [phone] Disposition Plan: Home.  Consults called: vascular surgery  Admission status: Inpatient.   Consultants:  Vascular surgery  Procedures:  Antimicrobials: Anti-infectives    Start     Dose/Rate Route Frequency Ordered Stop   07/27/15 1300  cefTRIAXone (ROCEPHIN) 1 g in dextrose 5 % 50 mL IVPB     1 g 100 mL/hr over 30 Minutes Intravenous Every 24 hours 07/27/15 1206     07/26/15 1100  cefUROXime (ZINACEF) 1.5 g in dextrose 5 % 50 mL IVPB  Status:  Discontinued     1.5 g 100 mL/hr over 30 Minutes Intravenous To ShortStay Surgical 07/25/15 1153 07/27/15 1206   07/25/15 1230  vancomycin (VANCOCIN) 1,500 mg in sodium chloride 0.9 % 500 mL IVPB  Status:  Discontinued     1,500 mg 250 mL/hr over 120 Minutes Intravenous Every 24 hours 07/25/15 1152 07/27/15 1206   07/25/15 0600  cefUROXime (ZINACEF) 1.5 g in dextrose 5 % 50 mL IVPB  Status:  Discontinued  1.5 g 100 mL/hr over 30 Minutes Intravenous To ShortStay Surgical 07/24/15 1322 07/25/15 1153   07/24/15 1800  vancomycin (VANCOCIN) 1,500 mg in sodium chloride 0.9 % 500 mL IVPB  Status:  Discontinued     1,500 mg 250 mL/hr over 120 Minutes Intravenous Every 24 hours 07/23/15 2104 07/25/15 1152   07/24/15 0030   piperacillin-tazobactam (ZOSYN) IVPB 3.375 g  Status:  Discontinued     3.375 g 12.5 mL/hr over 240 Minutes Intravenous Every 8 hours 07/23/15 2104 07/27/15 1206   07/23/15 2115  vancomycin (VANCOCIN) IVPB 1000 mg/200 mL premix     1,000 mg 200 mL/hr over 60 Minutes Intravenous NOW 07/23/15 2104 07/23/15 2342   07/23/15 1545  piperacillin-tazobactam (ZOSYN) IVPB 3.375 g     3.375 g 12.5 mL/hr over 240 Minutes Intravenous  Once 07/23/15 1544 07/23/15 1709   07/23/15 1545  vancomycin (VANCOCIN) IVPB 1000 mg/200 mL premix     1,000 mg 200 mL/hr over 60 Minutes Intravenous  Once 07/23/15 1544 07/23/15 1812     Subjective: Says he was taking ibuprofen, lyrica and neurotin by the handfuls for pain control at home  Objective: Filed Vitals:   07/28/15 0704 07/28/15 0909 07/28/15 0935 07/28/15 1206  BP: 149/77 149/70 137/86 140/85  Pulse: 93 97 100 88  Temp: 98.8 F (37.1 C) 99.7 F (37.6 C) 98.5 F (36.9 C) 99.4 F (37.4 C)  TempSrc: Oral Oral Oral Oral  Resp: 16 16 16 16   Height:      Weight:      SpO2: 100% 95% 98% 100%    Intake/Output Summary (Last 24 hours) at 07/28/15 1413 Last data filed at 07/28/15 1206  Gross per 24 hour  Intake    240 ml  Output   1975 ml  Net  -1735 ml   Filed Weights   07/23/15 1453  Weight: 90.266 kg (199 lb)    Exam: Respiratory: No rhonchi or palpitations. Cardiovascular: normal S1-S2 heard. Abdomen: Soft nontender bowel sounds present. Musculoskeletal: toes covered but redness appears decreased Neurologic: awake- easily falls asleep Psychiatric: normal mood/affect  Data Reviewed: Basic Metabolic Panel:  Recent Labs Lab 07/24/15 0313 07/25/15 0519 07/26/15 0431 07/27/15 1029 07/28/15 0433  NA 131* 132* 133* 136 137  K 3.7 3.6 3.8 3.8 4.0  CL 95* 97* 100* 102 102  CO2 29 25 25 26 26   GLUCOSE 231* 148* 162* 214* 129*  BUN 15 13 14 14 16   CREATININE 1.54* 1.72* 1.87* 1.91* 2.03*  CALCIUM 8.5* 8.6* 8.4* 8.6* 8.7*    Liver Function Tests:  Recent Labs Lab 07/24/15 0313 07/26/15 0431  AST 44* 54*  ALT 33 65*  ALKPHOS 47 68  BILITOT 0.5 0.6  PROT 7.1 7.0  ALBUMIN 2.3* 2.3*   No results for input(s): LIPASE, AMYLASE in the last 168 hours. No results for input(s): AMMONIA in the last 168 hours. CBC:  Recent Labs Lab 07/23/15 1548 07/24/15 0313 07/25/15 0519 07/26/15 0431 07/28/15 0433  WBC 16.1* 13.1* 12.4* 12.8* 14.0*  NEUTROABS 13.6* 10.4*  --   --   --   HGB 8.5* 7.3* 7.2* 7.0* 6.8*  HCT 26.8* 23.9* 23.7* 23.1* 21.9*  MCV 87.3 85.4 85.9 86.5 86.9  PLT 326 306 308 354 442*   Cardiac Enzymes: No results for input(s): CKTOTAL, CKMB, CKMBINDEX, TROPONINI in the last 168 hours. BNP (last 3 results) No results for input(s): PROBNP in the last 8760 hours. CBG:  Recent Labs Lab 07/27/15 1204 07/27/15 1641 07/27/15  2158 07/28/15 0707 07/28/15 1133  GLUCAP 231* 164* 139* 159* 191*    Recent Results (from the past 240 hour(s))  Blood culture (routine x 2)     Status: None (Preliminary result)   Collection Time: 07/23/15  3:51 PM  Result Value Ref Range Status   Specimen Description BLOOD LEFT ANTECUBITAL  Final   Special Requests BOTTLES DRAWN AEROBIC AND ANAEROBIC 4CC EACH  Final   Culture NO GROWTH 4 DAYS  Final   Report Status PENDING  Incomplete  Blood culture (routine x 2)     Status: None (Preliminary result)   Collection Time: 07/23/15  4:30 PM  Result Value Ref Range Status   Specimen Description BLOOD SITE NOT SPECIFIED DRAWN BY RN  Final   Special Requests BOTTLES DRAWN AEROBIC AND ANAEROBIC 8CC EACH  Final   Culture NO GROWTH 4 DAYS  Final   Report Status PENDING  Incomplete    Studies: US Renal  07/27/2015  CLINICAL DATA:  Acute kidney injury EXAM: RENAL / URINARY TRACT ULTRASOUND COMPLETE COMPARISON:  None. FINDINGS: Right Kidney: Length: 12.6 cm. Mildly increased echogenicity. No hydronephrosis or mass. Left Kidney: Length: 12.6 cm. Mildly increased  echogenicity. No hydronephrosis or mass. Bladder: Not evaluated.  Decompressed by Foley catheter. IMPRESSION: Evidence of medical renal disease Electronically Signed   By: Skipper Cliche M.D.   On: 07/27/2015 11:21   Scheduled Meds: . sodium chloride   Intravenous Once  . cefTRIAXone (ROCEPHIN)  IV  1 g Intravenous Q24H  . [START ON 07/29/2015] famotidine  20 mg Oral Daily  . [START ON 07/29/2015] gabapentin  300 mg Oral QHS  . insulin aspart  0-9 Units Subcutaneous TID WC  . insulin glargine  10 Units Subcutaneous Daily  . polyethylene glycol  17 g Oral Daily  . simvastatin  20 mg Oral q1800  . tamsulosin  0.4 mg Oral Daily   Continuous Infusions:   Principal Problem:   Cellulitis L foot Active Problems:   Peripheral vascular disease, unspecified (HCC)   Essential hypertension   Left foot infection   Type 2 diabetes mellitus with vascular disease (HCC)   Normocytic normochromic anemia   Cellulitis of left foot  Time spent:   Geradine Girt, DO [facility] Pager [phone]  If 7PM-7AM, please contact night-coverage [email or web address] Password [email or web address] 07/28/2015, 2:13 PM    LOS: 5 days

## 2015-07-28 NOTE — Consult Note (Addendum)
WOC wound consult note Reason for Consult: Consult requested for left foot/toes. Pt states he bumped the foot, then put it in warm water to soak and it developed blisters several days ago.  This has evolved to patchy areas of eschar/gangrenous changes. Wound type: Full thickness wound to plantar foot and across toes, including great toe Measurement: Affected area approx 7X3cm Wound bed: Black areas of eschar and fluctunce Drainage (amount, consistency, odor) Small amt tan drainage with strong odor Dressing procedure/placement/frequency: Pt is high risk for nonhealing wound related to left ABI .45. This is beyond [facility] scope of practice; please refer to vascular team for further plan of care. Pt is awaiting revascularization procedure according to the EMR and will need follow-up with Vascular team for ongoing wound assessment. Silvadene to affected area until further input is available from their team. Please re-consult if further assistance is needed.  Thank-you,  Julien Girt MSN, [place], [place], [employer], [place]

## 2015-07-28 NOTE — Progress Notes (Signed)
Subjective:  A little more confused today ? Narcotics ? 1300 UOP creatinine now over 2 Objective Vital signs in last 24 hours: Filed Vitals:   07/27/15 2210 07/28/15 0704 07/28/15 0909 07/28/15 0935  BP:  149/77 149/70 137/86  Pulse:  93 97 100  Temp: 99.2 F (37.3 C) 98.8 F (37.1 C) 99.7 F (37.6 C) 98.5 F (36.9 C)  TempSrc: Oral Oral Oral Oral  Resp:  16 16 16   Height:      Weight:      SpO2:  100% 95% 98%   Weight change:   Intake/Output Summary (Last 24 hours) at 07/28/15 0955 Last data filed at 07/28/15 [id number]  Gross per 24 hour  Intake    240 ml  Output   1975 ml  Net  -1735 ml    Assessment/Plan: 65 year old black male with diabetes mellitus including complications. In addition, he has mild CKD at baseline creatinine 1.3 but now with some acute on chronic renal failure in the setting of left foot cellulitis/ulcer, NSAIDs/lisinopril/ancomycin/previous bladder outlet obstruction  1.Renal-  I believe there is multifactorial cause of his acute on chronic renal failure including NSAIDs, possible ATN on lisinopril, possible vancomycin toxicity as well as bladder outlet obstruction.Marland Kitchen Appropriate actions have been taken, iscontinuing lisinopril ,vancomycin. He's been receiving IV hydration and now has lower extremity edema so  discontinuedIV fluids. He is no longer receiving any NSAIDs. Urinalysis did show granular casts which indicates kidney hypoperfusion/ATN somewhere along the line. hematuria was likely secondary to Foley catheter placement and not some new glomerulonephritis-recheck urinalysis confirms. I agree that performing an arteriogram with a rising creatinine could cause additional issues. Patient said he is not in any hurry and he does not want to do anything that could harm his kidneys more so I think we may need to wait for creatinine to trend down before arteriogram is done which I believe will happen eventually 2. Hypertension/volume - blood pressure is reasonable  actually now on no blood pressure medications. Morphine may be causing some lower blood pressures. Stopping IV fluids should help blood pressure from getting any higher 3. Anemia - hgb is low and it was during his first admission. It is out of proportion to his relatively mild chronic kidney disease. Supportive care at this time- per primary team- transfusion today  4. Foot ulcer and cellulitis - now on Rocephin. I understand that arteriogram is desired and do agree that I would not want to do it with a daily rising creatinine. I do not have a timetable on renal function improvement unfortunately. Would continue to keep checking creatinine daily  5. Confusion - likely narcotics with decreased kidne function /ecreased clearance   Laylah Riga A    Labs: Basic Metabolic Panel:  Recent Labs Lab 07/26/15 0431 07/27/15 1029 07/28/15 0433  NA 133* 136 137  K 3.8 3.8 4.0  CL 100* 102 102  CO2 25 26 26   GLUCOSE 162* 214* 129*  BUN 14 14 16   CREATININE 1.87* 1.91* 2.03*  CALCIUM 8.4* 8.6* 8.7*   Liver Function Tests:  Recent Labs Lab 07/24/15 0313 07/26/15 0431  AST 44* 54*  ALT 33 65*  ALKPHOS 47 68  BILITOT 0.5 0.6  PROT 7.1 7.0  ALBUMIN 2.3* 2.3*   No results for input(s): LIPASE, AMYLASE in the last 168 hours. No results for input(s): AMMONIA in the last 168 hours. CBC:  Recent Labs Lab 07/23/15 1548 07/24/15 0313 07/25/15 0519 07/26/15 0431 07/28/15 0433  WBC 16.1* 13.1* 12.4*  12.8* 14.0*  NEUTROABS 13.6* 10.4*  --   --   --   HGB 8.5* 7.3* 7.2* 7.0* 6.8*  HCT 26.8* 23.9* 23.7* 23.1* 21.9*  MCV 87.3 85.4 85.9 86.5 86.9  PLT 326 306 308 354 442*   Cardiac Enzymes: No results for input(s): CKTOTAL, CKMB, CKMBINDEX, TROPONINI in the last 168 hours. CBG:  Recent Labs Lab 07/27/15 0435 07/27/15 1204 07/27/15 1641 07/27/15 2158 07/28/15 0707  GLUCAP 181* 231* 164* 139* 159*    Iron Studies: No results for input(s): IRON, TIBC, TRANSFERRIN,  FERRITIN in the last 72 hours. Studies/Results: US Renal  07/27/2015  CLINICAL DATA:  Acute kidney injury EXAM: RENAL / URINARY TRACT ULTRASOUND COMPLETE COMPARISON:  None. FINDINGS: Right Kidney: Length: 12.6 cm. Mildly increased echogenicity. No hydronephrosis or mass. Left Kidney: Length: 12.6 cm. Mildly increased echogenicity. No hydronephrosis or mass. Bladder: Not evaluated.  Decompressed by Foley catheter. IMPRESSION: Evidence of medical renal disease Electronically Signed   By: Skipper Cliche M.D.   On: 07/27/2015 11:21   Medications: Infusions:    Scheduled Medications: . sodium chloride   Intravenous Once  . sodium chloride   Intravenous Once  . cefTRIAXone (ROCEPHIN)  IV  1 g Intravenous Q24H  . famotidine  20 mg Oral BID  . gabapentin  300 mg Oral BID  . insulin aspart  0-9 Units Subcutaneous TID WC  . insulin glargine  10 Units Subcutaneous Daily  . simvastatin  20 mg Oral q1800  . tamsulosin  0.4 mg Oral Daily    have reviewed scheduled and prn medications.  Physical Exam: General: sitting up eating pancakes- tangential with speech- lots of military references Heart: RRR Lungs: clear Abdomen: soft, non tender Extremities: pitting edema    07/28/2015,9:55 AM  LOS: 5 days

## 2015-07-28 NOTE — Progress Notes (Signed)
Notified Dr. Eliseo Squires that patient is being confused and paranoid.  He would not allow me or Roland Rack to get a urine sample from his foley to be tested.

## 2015-07-28 NOTE — Progress Notes (Signed)
CRITICAL VALUE ALERT  Critical value received:  Hemoblobin 6.8  Date of notification:  07/28/2015  Time of notification: 0625  Critical value read back: yes  Nurse who received alert:  Jari Sportsman  MD notified (1st page):  K.Kirby  Time of first page: [phone]  MD notified (2nd page): None  Time of second page: None  Responding MD: Tylene Fantasia  Time MD responded:  [phone]

## 2015-07-28 NOTE — Progress Notes (Signed)
Will cancel angio for today given increase in creatinine.   Will schedule for next week assuming renal function improves and renal is OK with dye  Annamarie Major

## 2015-07-29 LAB — RENAL FUNCTION PANEL
ALBUMIN: 2 g/dL — AB (ref 3.5–5.0)
ANION GAP: 10 (ref 5–15)
BUN: 16 mg/dL (ref 6–20)
CALCIUM: 8.6 mg/dL — AB (ref 8.9–10.3)
CO2: 25 mmol/L (ref 22–32)
Chloride: 101 mmol/L (ref 101–111)
Creatinine, Ser: 1.79 mg/dL — ABNORMAL HIGH (ref 0.61–1.24)
GFR, EST AFRICAN AMERICAN: 45 mL/min — AB (ref >60)
GFR, EST NON AFRICAN AMERICAN: 39 mL/min — AB (ref >60)
GLUCOSE: 178 mg/dL — AB (ref 65–99)
PHOSPHORUS: 4.1 mg/dL (ref 2.5–4.6)
POTASSIUM: 3.8 mmol/L (ref 3.5–5.1)
Sodium: 136 mmol/L (ref 135–145)

## 2015-07-29 LAB — CBC
HEMATOCRIT: 23.5 % — AB (ref 39.0–52.0)
HEMOGLOBIN: 7.2 g/dL — AB (ref 13.0–17.0)
MCH: 26.4 pg (ref 26.0–34.0)
MCHC: 30.6 g/dL (ref 30.0–36.0)
MCV: 86.1 fL (ref 78.0–100.0)
Platelets: 495 10*3/uL — ABNORMAL HIGH (ref 150–400)
RBC: 2.73 MIL/uL — AB (ref 4.22–5.81)
RDW: 14.2 % (ref 11.5–15.5)
WBC: 11.4 10*3/uL — AB (ref 4.0–10.5)

## 2015-07-29 LAB — GLUCOSE, CAPILLARY
GLUCOSE-CAPILLARY: 152 mg/dL — AB (ref 65–99)
Glucose-Capillary: 169 mg/dL — ABNORMAL HIGH (ref 65–99)
Glucose-Capillary: 99 mg/dL (ref 65–99)
Glucose-Capillary: 207 mg/dL — ABNORMAL HIGH (ref 65–99)

## 2015-07-29 LAB — CULTURE, BLOOD (ROUTINE X 2)
CULTURE: NO GROWTH
Culture: NO GROWTH

## 2015-07-29 LAB — TYPE AND SCREEN
ABO/RH(D): O POS
Antibody Screen: NEGATIVE
UNIT DIVISION: 0

## 2015-07-29 LAB — AMMONIA: Ammonia: 14 umol/L (ref 9–35)

## 2015-07-29 NOTE — Progress Notes (Signed)
Subjective:  A little more confused today ? Narcotics ? 2100 UOP creatinine down from over 2 to 1.79!    Objective Vital signs in last 24 hours: Filed Vitals:   07/28/15 0935 07/28/15 1206 07/28/15 2026 07/29/15 0429  BP: 137/86 140/85 163/83 158/81  Pulse: 100 88 102 91  Temp: 98.5 F (36.9 C) 99.4 F (37.4 C) 99.5 F (37.5 C) 97.9 F (36.6 C)  TempSrc: Oral Oral Oral Oral  Resp: 16 16 16 16   Height:      Weight:      SpO2: 98% 100% 100% 96%   Weight change:   Intake/Output Summary (Last 24 hours) at 07/29/15 1020 Last data filed at 07/29/15 0430  Gross per 24 hour  Intake      0 ml  Output   1550 ml  Net  -1550 ml    Assessment/Plan: 65 year old black male with diabetes mellitus including complications. In addition, he has mild CKD at baseline creatinine 1.3 but now with some acute on chronic renal failure in the setting of left foot cellulitis/ulcer, NSAIDs/lisinopril/ancomycin/previous bladder outlet obstruction  1.Renal-  I believe there is multifactorial cause of his acute on chronic renal failure including NSAIDs, possible ATN on lisinopril, possible vancomycin toxicity as well as bladder outlet obstruction.Marland Kitchen Appropriate actions have been taken, stopping lisinopril ,vancomycin.  He is no longer receiving any NSAIDs. Urinalysis did show granular casts which indicates kidney hypoperfusion/ATN somewhere along the line. hematuria was likely secondary to Foley catheter placement and not some new glomerulonephritis-recheck urinalysis confirms. I agree that performing an arteriogram with a rising creatinine could cause additional issues. Patient said he does not want to do anything that could harm his kidneys more so I think we may need to wait for creatinine to trend down before arteriogram is done which I believe will happen eventually- thankfully trending down today 2. Hypertension/volume - blood pressure is reasonable actually now on no blood pressure medications. Morphine may be  causing some lower blood pressures.  3. Anemia - hgb is low and it was during his first admission. It is out of proportion to his relatively mild chronic kidney disease. Supportive care at this time- per primary team- transfusion today  4. Foot ulcer and cellulitis - now on Rocephin. I understand that arteriogram is desired and do agree that I would not want to do it with a daily rising creatinine. I do not have a timetable on renal function improvement unfortunately. Good sign that creatinine has come down- may be able to do on Monday  5. Confusion - likely narcotics with decreased kidne function decreased clearance- more somnolent today but less confused    Keegan Bensch A    Labs: Basic Metabolic Panel:  Recent Labs Lab 07/27/15 1029 07/28/15 0433 07/29/15 0440  NA 136 137 136  K 3.8 4.0 3.8  CL 102 102 101  CO2 26 26 25   GLUCOSE 214* 129* 178*  BUN 14 16 16   CREATININE 1.91* 2.03* 1.79*  CALCIUM 8.6* 8.7* 8.6*  PHOS  --   --  4.1   Liver Function Tests:  Recent Labs Lab 07/24/15 0313 07/26/15 0431 07/29/15 0440  AST 44* 54*  --   ALT 33 65*  --   ALKPHOS 47 68  --   BILITOT 0.5 0.6  --   PROT 7.1 7.0  --   ALBUMIN 2.3* 2.3* 2.0*   No results for input(s): LIPASE, AMYLASE in the last 168 hours. No results for input(s): AMMONIA in the last  168 hours. CBC:  Recent Labs Lab 07/23/15 1548 07/24/15 0313 07/25/15 0519 07/26/15 0431 07/28/15 0433 07/29/15 0440  WBC 16.1* 13.1* 12.4* 12.8* 14.0* 11.4*  NEUTROABS 13.6* 10.4*  --   --   --   --   HGB 8.5* 7.3* 7.2* 7.0* 6.8* 7.2*  HCT 26.8* 23.9* 23.7* 23.1* 21.9* 23.5*  MCV 87.3 85.4 85.9 86.5 86.9 86.1  PLT 326 306 308 354 442* 495*   Cardiac Enzymes: No results for input(s): CKTOTAL, CKMB, CKMBINDEX, TROPONINI in the last 168 hours. CBG:  Recent Labs Lab 07/28/15 0707 07/28/15 1133 07/28/15 1648 07/28/15 2106 07/29/15 0607  GLUCAP 159* 191* 224* 221* 169*    Iron Studies: No results for  input(s): IRON, TIBC, TRANSFERRIN, FERRITIN in the last 72 hours. Studies/Results: US Renal  07/27/2015  CLINICAL DATA:  Acute kidney injury EXAM: RENAL / URINARY TRACT ULTRASOUND COMPLETE COMPARISON:  None. FINDINGS: Right Kidney: Length: 12.6 cm. Mildly increased echogenicity. No hydronephrosis or mass. Left Kidney: Length: 12.6 cm. Mildly increased echogenicity. No hydronephrosis or mass. Bladder: Not evaluated.  Decompressed by Foley catheter. IMPRESSION: Evidence of medical renal disease Electronically Signed   By: Skipper Cliche M.D.   On: 07/27/2015 11:21   Medications: Infusions:    Scheduled Medications: . sodium chloride   Intravenous Once  . cefTRIAXone (ROCEPHIN)  IV  1 g Intravenous Q24H  . famotidine  20 mg Oral Daily  . folic acid  1 mg Oral Daily  . gabapentin  300 mg Oral QHS  . insulin aspart  0-9 Units Subcutaneous TID WC  . insulin glargine  10 Units Subcutaneous Daily  . multivitamin with minerals  1 tablet Oral Daily  . polyethylene glycol  17 g Oral Daily  . silver sulfADIAZINE   Topical Daily  . simvastatin  20 mg Oral q1800  . tamsulosin  0.4 mg Oral Daily  . thiamine  100 mg Oral Daily   Or  . thiamine  100 mg Intravenous Daily    have reviewed scheduled and prn medications.  Physical Exam: General: somnolent- making more sense Heart: RRR Lungs: clear Abdomen: soft, non tender Extremities: pitting edema    07/29/2015,10:20 AM  LOS: 6 days

## 2015-07-29 NOTE — Progress Notes (Signed)
PROGRESS NOTE    Cory Weber  [id number]  DOB: 1950/03/26  DOA: 07/23/2015 PCP: Maggie Font, MD Outpatient Specialists:  Hospital course: Cory Weber is a 65 y.o. male with past medical history significant for diabetes mellitus type 2, hypertension, hyperlipidemia and tobacco abuse who was recently admitted for left foot cellulitis after patient had immeresed his foot in hot water. But due to his neuropathy he did not realize how hot the water was. At that time patient had blister on his right great toe which gone down but developed wound around the great toe. Patient was admitted and placed on antibiotics and vascular surgeon was consulted and ABI was 0.4. Patient was referred to [facility] surgeon as outpatient with Dr. Bridgett Larsson. Patient states his pain worsened last 2-3 days after discharge with new blisters forming on the dorsal aspect of his left foot around the lateral toes. There is also gangrenous appearing left toe. Patient is mildly tachycardic and has leukocytosis. On-call vascular surgeon Dr. Trula Slade at [name] was consulted by the ER physician. Patient transferred to [facility] for further management of his cellulitis and peripheral vascular disease. Patient is agreeable to transfer. Patient also has anemia and during last admission was noted to have stool for occult blood positive. Patient was taking NSAIDs and was instructed to stop and was advised further GI workup as outpatient.  Assessment & Plan:   Cellulitis of the left foot with vascular disease - patient has been placed on vancomycin and Zosyn- changed to rocephin -vascular surgery consulted: rescheduled procedure until cr improved   Normocytic normochromic anemia - recent admission patient had stool for occult blood positive. Patient was instructed to stop taking NSAIDs. Closely follow CBC.  -s/p 1 unit PRBC  - Pepcid.   Diabetes mellitus type 2 with peripheral vascular disease - hold metformin and sulfonylurea  while inpatient. Patient is on sliding scale coverage. Closely follow CBGs.  Hypertension - d/c lisinopril. Hold hydrochlorothiazide  Hyperlipidemia on statins.   Tobacco abuse - tobacco cessation counseling requested.  History of hepatitis C with possible cirrhosis and has undergone complete treatment for hepatitis C.  Chronic kidney disease stage III -has > 400 cc on PVR-- will start flomax and insert foley Renal U/S- medical disease -appreciate nephrology  Encephalopathy -d/c pain medications -decrease Neurontin -suspect related to meds and worsening kidney function  DVT prophylaxis: SCDs. Code Status: Full code.  Family Communication: discussed with daughter on phone- lives in [place] [phone] Disposition Plan: Home.  Consults called: vascular surgery  Admission status: Inpatient.   Consultants:  Vascular surgery  Procedures:  Antimicrobials: Anti-infectives    Start     Dose/Rate Route Frequency Ordered Stop   07/27/15 1300  cefTRIAXone (ROCEPHIN) 1 g in dextrose 5 % 50 mL IVPB     1 g 100 mL/hr over 30 Minutes Intravenous Every 24 hours 07/27/15 1206     07/26/15 1100  cefUROXime (ZINACEF) 1.5 g in dextrose 5 % 50 mL IVPB  Status:  Discontinued     1.5 g 100 mL/hr over 30 Minutes Intravenous To ShortStay Surgical 07/25/15 1153 07/27/15 1206   07/25/15 1230  vancomycin (VANCOCIN) 1,500 mg in sodium chloride 0.9 % 500 mL IVPB  Status:  Discontinued     1,500 mg 250 mL/hr over 120 Minutes Intravenous Every 24 hours 07/25/15 1152 07/27/15 1206   07/25/15 0600  cefUROXime (ZINACEF) 1.5 g in dextrose 5 % 50 mL IVPB  Status:  Discontinued  1.5 g 100 mL/hr over 30 Minutes Intravenous To ShortStay Surgical 07/24/15 1322 07/25/15 1153   07/24/15 1800  vancomycin (VANCOCIN) 1,500 mg in sodium chloride 0.9 % 500 mL IVPB  Status:  Discontinued     1,500 mg 250 mL/hr over 120 Minutes Intravenous Every 24 hours 07/23/15 2104 07/25/15 1152    07/24/15 0030  piperacillin-tazobactam (ZOSYN) IVPB 3.375 g  Status:  Discontinued     3.375 g 12.5 mL/hr over 240 Minutes Intravenous Every 8 hours 07/23/15 2104 07/27/15 1206   07/23/15 2115  vancomycin (VANCOCIN) IVPB 1000 mg/200 mL premix     1,000 mg 200 mL/hr over 60 Minutes Intravenous NOW 07/23/15 2104 07/23/15 2342   07/23/15 1545  piperacillin-tazobactam (ZOSYN) IVPB 3.375 g     3.375 g 12.5 mL/hr over 240 Minutes Intravenous  Once 07/23/15 1544 07/23/15 1709   07/23/15 1545  vancomycin (VANCOCIN) IVPB 1000 mg/200 mL premix     1,000 mg 200 mL/hr over 60 Minutes Intravenous  Once 07/23/15 1544 07/23/15 1812     Subjective: Answer questions appropriately, falls alseep  Objective: Filed Vitals:   07/28/15 1206 07/28/15 2026 07/29/15 0429 07/29/15 1408  BP: 140/85 163/83 158/81 151/88  Pulse: 88 102 91 97  Temp: 99.4 F (37.4 C) 99.5 F (37.5 C) 97.9 F (36.6 C) 98.9 F (37.2 C)  TempSrc: Oral Oral Oral Oral  Resp: 16 16 16 16   Height:      Weight:      SpO2: 100% 100% 96% 100%    Intake/Output Summary (Last 24 hours) at 07/29/15 1516 Last data filed at 07/29/15 1409  Gross per 24 hour  Intake    290 ml  Output   1900 ml  Net  -1610 ml   Filed Weights   07/23/15 1453  Weight: 90.266 kg (199 lb)    Exam: Respiratory: No rhonchi or palpitations. Cardiovascular: normal S1-S2 heard. Abdomen: Soft nontender bowel sounds present. Musculoskeletal: toes covered but redness appears decreased Neurologic: awake- easily falls asleep Psychiatric: normal mood/affect  Data Reviewed: Basic Metabolic Panel:  Recent Labs Lab 07/25/15 0519 07/26/15 0431 07/27/15 1029 07/28/15 0433 07/29/15 0440  NA 132* 133* 136 137 136  K 3.6 3.8 3.8 4.0 3.8  CL 97* 100* 102 102 101  CO2 25 25 26 26 25   GLUCOSE 148* 162* 214* 129* 178*  BUN 13 14 14 16 16   CREATININE 1.72* 1.87* 1.91* 2.03* 1.79*  CALCIUM 8.6* 8.4* 8.6* 8.7* 8.6*  PHOS  --   --   --   --  4.1    Liver Function Tests:  Recent Labs Lab 07/24/15 0313 07/26/15 0431 07/29/15 0440  AST 44* 54*  --   ALT 33 65*  --   ALKPHOS 47 68  --   BILITOT 0.5 0.6  --   PROT 7.1 7.0  --   ALBUMIN 2.3* 2.3* 2.0*   No results for input(s): LIPASE, AMYLASE in the last 168 hours.  Recent Labs Lab 07/29/15 1110  AMMONIA 14   CBC:  Recent Labs Lab 07/23/15 1548 07/24/15 0313 07/25/15 0519 07/26/15 0431 07/28/15 0433 07/29/15 0440  WBC 16.1* 13.1* 12.4* 12.8* 14.0* 11.4*  NEUTROABS 13.6* 10.4*  --   --   --   --   HGB 8.5* 7.3* 7.2* 7.0* 6.8* 7.2*  HCT 26.8* 23.9* 23.7* 23.1* 21.9* 23.5*  MCV 87.3 85.4 85.9 86.5 86.9 86.1  PLT 326 306 308 354 442* 495*   Cardiac Enzymes: No results for input(s):  CKTOTAL, CKMB, CKMBINDEX, TROPONINI in the last 168 hours. BNP (last 3 results) No results for input(s): PROBNP in the last 8760 hours. CBG:  Recent Labs Lab 07/28/15 1133 07/28/15 1648 07/28/15 2106 07/29/15 0607 07/29/15 1126  GLUCAP 191* 224* 221* 169* 99    Recent Results (from the past 240 hour(s))  Blood culture (routine x 2)     Status: None   Collection Time: 07/23/15  3:51 PM  Result Value Ref Range Status   Specimen Description BLOOD LEFT ANTECUBITAL  Final   Special Requests BOTTLES DRAWN AEROBIC AND ANAEROBIC 4CC EACH  Final   Culture NO GROWTH 6 DAYS  Final   Report Status 07/29/2015 FINAL  Final  Blood culture (routine x 2)     Status: None   Collection Time: 07/23/15  4:30 PM  Result Value Ref Range Status   Specimen Description BLOOD SITE NOT SPECIFIED DRAWN BY RN  Final   Special Requests BOTTLES DRAWN AEROBIC AND ANAEROBIC [place]  Final   Culture NO GROWTH 6 DAYS  Final   Report Status 07/29/2015 FINAL  Final    Studies: No results found. Scheduled Meds: . sodium chloride   Intravenous Once  . cefTRIAXone (ROCEPHIN)  IV  1 g Intravenous Q24H  . famotidine  20 mg Oral Daily  . folic acid  1 mg Oral Daily  . gabapentin  300 mg Oral QHS  .  insulin aspart  0-9 Units Subcutaneous TID WC  . insulin glargine  10 Units Subcutaneous Daily  . multivitamin with minerals  1 tablet Oral Daily  . polyethylene glycol  17 g Oral Daily  . silver sulfADIAZINE   Topical Daily  . simvastatin  20 mg Oral q1800  . tamsulosin  0.4 mg Oral Daily  . thiamine  100 mg Oral Daily   Or  . thiamine  100 mg Intravenous Daily   Continuous Infusions:   Principal Problem:   Cellulitis L foot Active Problems:   Peripheral vascular disease, unspecified (HCC)   Essential hypertension   Left foot infection   Type 2 diabetes mellitus with vascular disease (HCC)   Normocytic normochromic anemia   Cellulitis of left foot  Time spent:   Geradine Girt, DO [facility] Pager [phone]  If 7PM-7AM, please contact night-coverage [email or web address] Password [email or web address] 07/29/2015, 3:16 PM    LOS: 6 days

## 2015-07-30 LAB — RENAL FUNCTION PANEL
ANION GAP: 9 (ref 5–15)
Albumin: 2 g/dL — ABNORMAL LOW (ref 3.5–5.0)
BUN: 14 mg/dL (ref 6–20)
CALCIUM: 8.6 mg/dL — AB (ref 8.9–10.3)
CHLORIDE: 100 mmol/L — AB (ref 101–111)
CO2: 27 mmol/L (ref 22–32)
Creatinine, Ser: 1.8 mg/dL — ABNORMAL HIGH (ref 0.61–1.24)
GFR calc non Af Amer: 39 mL/min — ABNORMAL LOW (ref >60)
GFR, EST AFRICAN AMERICAN: 45 mL/min — AB (ref >60)
Glucose, Bld: 152 mg/dL — ABNORMAL HIGH (ref 65–99)
Phosphorus: 3.9 mg/dL (ref 2.5–4.6)
Potassium: 4.1 mmol/L (ref 3.5–5.1)
SODIUM: 136 mmol/L (ref 135–145)

## 2015-07-30 LAB — CBC
HEMATOCRIT: 24.8 % — AB (ref 39.0–52.0)
Hemoglobin: 7.7 g/dL — ABNORMAL LOW (ref 13.0–17.0)
MCH: 26.8 pg (ref 26.0–34.0)
MCHC: 31 g/dL (ref 30.0–36.0)
MCV: 86.4 fL (ref 78.0–100.0)
PLATELETS: 544 10*3/uL — AB (ref 150–400)
RBC: 2.87 MIL/uL — AB (ref 4.22–5.81)
RDW: 14.1 % (ref 11.5–15.5)
WBC: 10.8 10*3/uL — AB (ref 4.0–10.5)

## 2015-07-30 LAB — GLUCOSE, CAPILLARY
GLUCOSE-CAPILLARY: 187 mg/dL — AB (ref 65–99)
Glucose-Capillary: 142 mg/dL — ABNORMAL HIGH (ref 65–99)
Glucose-Capillary: 161 mg/dL — ABNORMAL HIGH (ref 65–99)
Glucose-Capillary: 171 mg/dL — ABNORMAL HIGH (ref 65–99)

## 2015-07-30 LAB — OCCULT BLOOD X 1 CARD TO LAB, STOOL: Fecal Occult Bld: NEGATIVE

## 2015-07-30 NOTE — Progress Notes (Signed)
Patient tentatively on for Tuesday, pending nephrology clearance and improvement in creatinine  Cory Weber [phone]

## 2015-07-30 NOTE — Progress Notes (Signed)
PROGRESS NOTE    Cory Weber  [id number]  DOB: 1951-01-13  DOA: 07/23/2015 PCP: Maggie Font, MD Outpatient Specialists:  Hospital course: Cory Weber is a 65 y.o. male with past medical history significant for diabetes mellitus type 2, hypertension, hyperlipidemia and tobacco abuse who was recently admitted for left foot cellulitis after patient had immeresed his foot in hot water. But due to his neuropathy he did not realize how hot the water was. At that time patient had blister on his right great toe which gone down but developed wound around the great toe. Patient was admitted and placed on antibiotics and vascular surgeon was consulted and ABI was 0.4. Patient was referred to Vascular surgeon as outpatient with Dr. Bridgett Larsson. Patient states his pain worsened last 2-3 days after discharge with new blisters forming on the dorsal aspect of his left foot around the lateral toes. There is also gangrenous appearing left toe. Patient is mildly tachycardic and has leukocytosis. On-call vascular surgeon Dr. Trula Slade at [name] was consulted by the ER physician. Patient transferred to [facility] for further management of his cellulitis and peripheral vascular disease.  Patient also has anemia and during last admission was noted to have stool for occult blood positive. Patient was taking NSAIDs and was instructed to stop and was advised further GI workup as outpatient.  Assessment & Plan:   Cellulitis of the left foot with vascular disease - patient has been placed on vancomycin and Zosyn- changed to rocephin -vascular surgery consulted: rescheduled procedure until cr improved  -will make NPO after midnight in hope of procedure  Normocytic normochromic anemia - recent admission patient had stool for occult blood positive. Patient was instructed to stop taking NSAIDs. Closely follow CBC.  -s/p 1 unit PRBC  - Pepcid.   Diabetes mellitus type 2 with peripheral vascular disease - hold  metformin and sulfonylurea while inpatient. Patient is on sliding scale coverage. Closely follow CBGs.  Hypertension - d/c lisinopril. Hold hydrochlorothiazide  Hyperlipidemia on statins.   Tobacco abuse - tobacco cessation counseling requested.  History of hepatitis C with possible cirrhosis and has undergone complete treatment for hepatitis C.  Chronic kidney disease stage III -has > 400 cc on PVR-- will start flomax and insert foley Renal U/S- medical disease -appreciate nephrology  Encephalopathy -d/c pain medications -decrease Neurontin -suspect related to meds and worsening kidney function  DVT prophylaxis: SCDs. Code Status: Full code.  Family Communication: discussed with daughter on phone- lives in [place] [phone] Disposition Plan: Home.  Consults called: vascular surgery  Admission status: Inpatient.   Consultants:  Vascular surgery  Procedures:  Antimicrobials: Anti-infectives    Start     Dose/Rate Route Frequency Ordered Stop   07/27/15 1300  cefTRIAXone (ROCEPHIN) 1 g in dextrose 5 % 50 mL IVPB     1 g 100 mL/hr over 30 Minutes Intravenous Every 24 hours 07/27/15 1206     07/26/15 1100  cefUROXime (ZINACEF) 1.5 g in dextrose 5 % 50 mL IVPB  Status:  Discontinued     1.5 g 100 mL/hr over 30 Minutes Intravenous To ShortStay Surgical 07/25/15 1153 07/27/15 1206   07/25/15 1230  vancomycin (VANCOCIN) 1,500 mg in sodium chloride 0.9 % 500 mL IVPB  Status:  Discontinued     1,500 mg 250 mL/hr over 120 Minutes Intravenous Every 24 hours 07/25/15 1152 07/27/15 1206   07/25/15 0600  cefUROXime (ZINACEF) 1.5 g in dextrose 5 % 50 mL IVPB  Status:  Discontinued     1.5 g 100 mL/hr over 30 Minutes Intravenous To ShortStay Surgical 07/24/15 1322 07/25/15 1153   07/24/15 1800  vancomycin (VANCOCIN) 1,500 mg in sodium chloride 0.9 % 500 mL IVPB  Status:  Discontinued     1,500 mg 250 mL/hr over 120 Minutes Intravenous Every 24 hours 07/23/15  2104 07/25/15 1152   07/24/15 0030  piperacillin-tazobactam (ZOSYN) IVPB 3.375 g  Status:  Discontinued     3.375 g 12.5 mL/hr over 240 Minutes Intravenous Every 8 hours 07/23/15 2104 07/27/15 1206   07/23/15 2115  vancomycin (VANCOCIN) IVPB 1000 mg/200 mL premix     1,000 mg 200 mL/hr over 60 Minutes Intravenous NOW 07/23/15 2104 07/23/15 2342   07/23/15 1545  piperacillin-tazobactam (ZOSYN) IVPB 3.375 g     3.375 g 12.5 mL/hr over 240 Minutes Intravenous  Once 07/23/15 1544 07/23/15 1709   07/23/15 1545  vancomycin (VANCOCIN) IVPB 1000 mg/200 mL premix     1,000 mg 200 mL/hr over 60 Minutes Intravenous  Once 07/23/15 1544 07/23/15 1812     Subjective: No overnight events, appears to be at baseline  Objective: Filed Vitals:   07/29/15 0429 07/29/15 1408 07/29/15 1932 07/30/15 0459  BP: 158/81 151/88 143/73 131/68  Pulse: 91 97 98 87  Temp: 97.9 F (36.6 C) 98.9 F (37.2 C) 99.8 F (37.7 C) 98.1 F (36.7 C)  TempSrc: Oral Oral Oral Oral  Resp: 16 16 16 16   Height:      Weight:      SpO2: 96% 100% 99% 98%    Intake/Output Summary (Last 24 hours) at 07/30/15 1153 Last data filed at 07/30/15 0817  Gross per 24 hour  Intake    650 ml  Output   1800 ml  Net  -1150 ml   Filed Weights   07/23/15 1453  Weight: 90.266 kg (199 lb)    Exam: Respiratory: No rhonchi or palpitations. Cardiovascular: normal S1-S2 heard. Abdomen: Soft nontender bowel sounds present. Musculoskeletal: toes wrapped Neurologic: pleasant/cooperative   Data Reviewed: Basic Metabolic Panel:  Recent Labs Lab 07/26/15 0431 07/27/15 1029 07/28/15 0433 07/29/15 0440 07/30/15 0411  NA 133* 136 137 136 136  K 3.8 3.8 4.0 3.8 4.1  CL 100* 102 102 101 100*  CO2 25 26 26 25 27   GLUCOSE 162* 214* 129* 178* 152*  BUN 14 14 16 16 14   CREATININE 1.87* 1.91* 2.03* 1.79* 1.80*  CALCIUM 8.4* 8.6* 8.7* 8.6* 8.6*  PHOS  --   --   --  4.1 3.9   Liver Function Tests:  Recent Labs Lab  07/24/15 0313 07/26/15 0431 07/29/15 0440 07/30/15 0411  AST 44* 54*  --   --   ALT 33 65*  --   --   ALKPHOS 47 68  --   --   BILITOT 0.5 0.6  --   --   PROT 7.1 7.0  --   --   ALBUMIN 2.3* 2.3* 2.0* 2.0*   No results for input(s): LIPASE, AMYLASE in the last 168 hours.  Recent Labs Lab 07/29/15 1110  AMMONIA 14   CBC:  Recent Labs Lab 07/23/15 1548 07/24/15 0313 07/25/15 0519 07/26/15 0431 07/28/15 0433 07/29/15 0440  WBC 16.1* 13.1* 12.4* 12.8* 14.0* 11.4*  NEUTROABS 13.6* 10.4*  --   --   --   --   HGB 8.5* 7.3* 7.2* 7.0* 6.8* 7.2*  HCT 26.8* 23.9* 23.7* 23.1* 21.9* 23.5*  MCV 87.3 85.4 85.9 86.5 86.9 86.1  PLT 326 306 308 354 442* 495*   Cardiac Enzymes: No results for input(s): CKTOTAL, CKMB, CKMBINDEX, TROPONINI in the last 168 hours. BNP (last 3 results) No results for input(s): PROBNP in the last 8760 hours. CBG:  Recent Labs Lab 07/29/15 0607 07/29/15 1126 07/29/15 1621 07/29/15 2108 07/30/15 0558  GLUCAP 169* 99 152* 207* 142*    Recent Results (from the past 240 hour(s))  Blood culture (routine x 2)     Status: None   Collection Time: 07/23/15  3:51 PM  Result Value Ref Range Status   Specimen Description BLOOD LEFT ANTECUBITAL  Final   Special Requests BOTTLES DRAWN AEROBIC AND ANAEROBIC 4CC EACH  Final   Culture NO GROWTH 6 DAYS  Final   Report Status 07/29/2015 FINAL  Final  Blood culture (routine x 2)     Status: None   Collection Time: 07/23/15  4:30 PM  Result Value Ref Range Status   Specimen Description BLOOD SITE NOT SPECIFIED DRAWN BY RN  Final   Special Requests BOTTLES DRAWN AEROBIC AND ANAEROBIC [place]  Final   Culture NO GROWTH 6 DAYS  Final   Report Status 07/29/2015 FINAL  Final    Studies: No results found. Scheduled Meds: . sodium chloride   Intravenous Once  . cefTRIAXone (ROCEPHIN)  IV  1 g Intravenous Q24H  . famotidine  20 mg Oral Daily  . folic acid  1 mg Oral Daily  . gabapentin  300 mg Oral QHS  .  insulin aspart  0-9 Units Subcutaneous TID WC  . multivitamin with minerals  1 tablet Oral Daily  . polyethylene glycol  17 g Oral Daily  . silver sulfADIAZINE   Topical Daily  . simvastatin  20 mg Oral q1800  . tamsulosin  0.4 mg Oral Daily  . thiamine  100 mg Oral Daily   Or  . thiamine  100 mg Intravenous Daily   Continuous Infusions:   Principal Problem:   Cellulitis L foot Active Problems:   Peripheral vascular disease, unspecified (HCC)   Essential hypertension   Left foot infection   Type 2 diabetes mellitus with vascular disease (HCC)   Normocytic normochromic anemia   Cellulitis of left foot  Time spent: 25 min  [place], DO [facility] Pager [phone]  If 7PM-7AM, please contact night-coverage [email or web address] Password [email or web address] 07/30/2015, 11:53 AM    LOS: 7 days

## 2015-07-30 NOTE — Progress Notes (Signed)
Subjective:  Good UOP- crt stable  Objective Vital signs in last 24 hours: Filed Vitals:   07/29/15 0429 07/29/15 1408 07/29/15 1932 07/30/15 0459  BP: 158/81 151/88 143/73 131/68  Pulse: 91 97 98 87  Temp: 97.9 F (36.6 C) 98.9 F (37.2 C) 99.8 F (37.7 C) 98.1 F (36.7 C)  TempSrc: Oral Oral Oral Oral  Resp: 16 16 16 16   Height:      Weight:      SpO2: 96% 100% 99% 98%   Weight change:   Intake/Output Summary (Last 24 hours) at 07/30/15 0933 Last data filed at 07/30/15 0817  Gross per 24 hour  Intake    650 ml  Output   1800 ml  Net  -1150 ml    Assessment/Plan: 65 year old black male with diabetes mellitus including complications. In addition, he has mild CKD at baseline creatinine 1.3 but now with some acute on chronic renal failure in the setting of left foot cellulitis/ulcer, NSAIDs/lisinopril/ancomycin/previous bladder outlet obstruction  1.Renal-  I believe there is multifactorial cause of his acute on chronic renal failure including NSAIDs, possible ATN on lisinopril, possible vancomycin toxicity as well as bladder outlet obstruction.Marland Kitchen Appropriate actions have been taken, stopping lisinopril ,vancomycin.  He is no longer receiving any NSAIDs. Urinalysis did show granular casts which indicates kidney hypoperfusion/ATN somewhere along the line. hematuria was likely secondary to Foley catheter placement-recheck urinalysis shows clearing. I agree that performing an arteriogram with a rising creatinine could cause additional issues. Patient said he does not want to do anything that could harm his kidneys - need to wait for creatinine to trend down before arteriogram is done finally happening- arteriogram slated for 6/19- tomorrow 2. Hypertension/volume - blood pressure is reasonable actually now on no blood pressure medications. Morphine may be causing some lower blood pressures.  3. Anemia - hgb is low and it was during his first admission. It is out of proportion to his  relatively mild chronic kidney disease. Supportive care at this time- per primary team- transfusion 6/17 4. Foot ulcer and cellulitis - now on Rocephin. I understand that arteriogram is desired and do agree that I would not want to do it with a daily rising creatinine.  Good sign that creatinine has come down- slated to do on Monday.  If creatinine 1.7 or lower I would go ahead using appropriate precautions- will need to check labs about 24 hours after dye exposure   5. Confusion - likely narcotics with decreased kidne function decreased clearance- more somnolent today but less confused    Kathie Posa A    Labs: Basic Metabolic Panel:  Recent Labs Lab 07/28/15 0433 07/29/15 0440 07/30/15 0411  NA 137 136 136  K 4.0 3.8 4.1  CL 102 101 100*  CO2 26 25 27   GLUCOSE 129* 178* 152*  BUN 16 16 14   CREATININE 2.03* 1.79* 1.80*  CALCIUM 8.7* 8.6* 8.6*  PHOS  --  4.1 3.9   Liver Function Tests:  Recent Labs Lab 07/24/15 0313 07/26/15 0431 07/29/15 0440 07/30/15 0411  AST 44* 54*  --   --   ALT 33 65*  --   --   ALKPHOS 47 68  --   --   BILITOT 0.5 0.6  --   --   PROT 7.1 7.0  --   --   ALBUMIN 2.3* 2.3* 2.0* 2.0*   No results for input(s): LIPASE, AMYLASE in the last 168 hours.  Recent Labs Lab 07/29/15 1110  AMMONIA 14  CBC:  Recent Labs Lab 07/23/15 1548 07/24/15 0313 07/25/15 0519 07/26/15 0431 07/28/15 0433 07/29/15 0440  WBC 16.1* 13.1* 12.4* 12.8* 14.0* 11.4*  NEUTROABS 13.6* 10.4*  --   --   --   --   HGB 8.5* 7.3* 7.2* 7.0* 6.8* 7.2*  HCT 26.8* 23.9* 23.7* 23.1* 21.9* 23.5*  MCV 87.3 85.4 85.9 86.5 86.9 86.1  PLT 326 306 308 354 442* 495*   Cardiac Enzymes: No results for input(s): CKTOTAL, CKMB, CKMBINDEX, TROPONINI in the last 168 hours. CBG:  Recent Labs Lab 07/29/15 0607 07/29/15 1126 07/29/15 1621 07/29/15 2108 07/30/15 0558  GLUCAP 169* 99 152* 207* 142*    Iron Studies: No results for input(s): IRON, TIBC, TRANSFERRIN,  FERRITIN in the last 72 hours. Studies/Results: No results found. Medications: Infusions:    Scheduled Medications: . sodium chloride   Intravenous Once  . cefTRIAXone (ROCEPHIN)  IV  1 g Intravenous Q24H  . famotidine  20 mg Oral Daily  . folic acid  1 mg Oral Daily  . gabapentin  300 mg Oral QHS  . insulin aspart  0-9 Units Subcutaneous TID WC  . multivitamin with minerals  1 tablet Oral Daily  . polyethylene glycol  17 g Oral Daily  . silver sulfADIAZINE   Topical Daily  . simvastatin  20 mg Oral q1800  . tamsulosin  0.4 mg Oral Daily  . thiamine  100 mg Oral Daily   Or  . thiamine  100 mg Intravenous Daily    have reviewed scheduled and prn medications.  Physical Exam: General: somnolent- making more sense Heart: RRR Lungs: clear Abdomen: soft, non tender Extremities: pitting edema    07/30/2015,9:33 AM  LOS: 7 days

## 2015-07-31 LAB — RENAL FUNCTION PANEL
ANION GAP: 8 (ref 5–15)
Albumin: 2.1 g/dL — ABNORMAL LOW (ref 3.5–5.0)
BUN: 15 mg/dL (ref 6–20)
CHLORIDE: 101 mmol/L (ref 101–111)
CO2: 26 mmol/L (ref 22–32)
Calcium: 8.6 mg/dL — ABNORMAL LOW (ref 8.9–10.3)
Creatinine, Ser: 1.69 mg/dL — ABNORMAL HIGH (ref 0.61–1.24)
GFR, EST AFRICAN AMERICAN: 48 mL/min — AB (ref >60)
GFR, EST NON AFRICAN AMERICAN: 42 mL/min — AB (ref >60)
Glucose, Bld: 160 mg/dL — ABNORMAL HIGH (ref 65–99)
POTASSIUM: 4.1 mmol/L (ref 3.5–5.1)
Phosphorus: 3.7 mg/dL (ref 2.5–4.6)
Sodium: 135 mmol/L (ref 135–145)

## 2015-07-31 LAB — GLUCOSE, CAPILLARY
GLUCOSE-CAPILLARY: 179 mg/dL — AB (ref 65–99)
GLUCOSE-CAPILLARY: 85 mg/dL (ref 65–99)
GLUCOSE-CAPILLARY: 105 mg/dL — AB (ref 65–99)
Glucose-Capillary: 130 mg/dL — ABNORMAL HIGH (ref 65–99)

## 2015-07-31 MED ORDER — SODIUM CHLORIDE 0.9 % IV SOLN
INTRAVENOUS | Status: AC
  Administered 2015-07-31: 10:00:00 via INTRAVENOUS

## 2015-07-31 MED ORDER — HEPARIN SODIUM (PORCINE) 5000 UNIT/ML IJ SOLN
5000.0000 [IU] | Freq: Three times a day (TID) | INTRAMUSCULAR | Status: DC
  Administered 2015-07-31 – 2015-08-10 (×27): 5000 [IU] via SUBCUTANEOUS
  Filled 2015-07-31 (×28): qty 1

## 2015-07-31 NOTE — Progress Notes (Signed)
Assessment/Plan: 65 year old black male with diabetes mellitus including complications. In addition, he has mild CKD at baseline creatinine 1.3 but now with some acute on chronic renal failure in the setting of left foot cellulitis/ulcer, NSAIDs/lisinopril/ancomycin/previous bladder outlet obstruction  1.Renal- I believe there is multifactorial cause of his acute on chronic renal failure including NSAIDs, possible ATN on lisinopril, possible vancomycin toxicity as well as bladder outlet obstruction.Marland Kitchenarteriogram slated for 6/19- Give IVFs 2. Hypertension/volume - blood pressure is reasonable actually now on no blood pressure medications.  3. Anemia - It is out of proportion to his relatively mild chronic kidney disease. s/p PRBCs 4. Foot ulcer and cellulitis - now on Rocephin.   Subjective: Interval History: Wants to eat, poss a~gram today  Objective: Vital signs in last 24 hours: Temp:  [98.4 F (36.9 C)-99.8 F (37.7 C)] 99.8 F (37.7 C) (06/19 0355) Pulse Rate:  [81-95] 95 (06/19 0355) Resp:  [16-18] 18 (06/19 0355) BP: (141-158)/(74-98) 141/74 mmHg (06/19 0355) SpO2:  [97 %-99 %] 97 % (06/19 0355) Weight change:   Intake/Output from previous day: 06/18 0701 - 06/19 0700 In: 360 [P.O.:360] Out: 2800 [Urine:2800] Intake/Output this shift:    General appearance: alert and cooperative Resp: clear to auscultation bilaterally Cardio: regular rate and rhythm, S1, S2 normal, no murmur, click, rub or gallop ExtremitieL foot wrapped  Lab Results:  Recent Labs  07/29/15 0440 07/30/15 1348  WBC 11.4* 10.8*  HGB 7.2* 7.7*  HCT 23.5* 24.8*  PLT 495* 544*   BMET:  Recent Labs  07/30/15 0411 07/31/15 0412  NA 136 135  K 4.1 4.1  CL 100* 101  CO2 27 26  GLUCOSE 152* 160*  BUN 14 15  CREATININE 1.80* 1.69*  CALCIUM 8.6* 8.6*   No results for input(s): PTH in the last 72 hours. Iron Studies: No results for input(s): IRON, TIBC, TRANSFERRIN, FERRITIN in the last 72  hours. Studies/Results: No results found.  Scheduled: . sodium chloride   Intravenous Once  . cefTRIAXone (ROCEPHIN)  IV  1 g Intravenous Q24H  . famotidine  20 mg Oral Daily  . folic acid  1 mg Oral Daily  . gabapentin  300 mg Oral QHS  . insulin aspart  0-9 Units Subcutaneous TID WC  . multivitamin with minerals  1 tablet Oral Daily  . polyethylene glycol  17 g Oral Daily  . silver sulfADIAZINE   Topical Daily  . simvastatin  20 mg Oral q1800  . tamsulosin  0.4 mg Oral Daily  . thiamine  100 mg Oral Daily   Or  . thiamine  100 mg Intravenous Daily    LOS: 8 days   Mikah Poss C 07/31/2015,8:25 AM

## 2015-07-31 NOTE — Progress Notes (Signed)
PROGRESS NOTE    Cory Weber  [id number]  DOB: 03-30-1950  DOA: 07/23/2015 PCP: Maggie Font, MD Outpatient Specialists:  Hospital course: Cory Weber is a 65 y.o. male with past medical history significant for diabetes mellitus type 2, hypertension, hyperlipidemia and tobacco abuse who was recently admitted for left foot cellulitis after patient had immeresed his foot in hot water. But due to his neuropathy he did not realize how hot the water was. At that time patient had blister on his right great toe which gone down but developed wound around the great toe. Patient was admitted and placed on antibiotics and vascular surgeon was consulted and ABI was 0.4. Patient was referred to Vascular surgeon as outpatient with Dr. Bridgett Larsson. Patient states his pain worsened last 2-3 days after discharge with new blisters forming on the dorsal aspect of his left foot around the lateral toes. There is also gangrenous appearing left toe. Patient is mildly tachycardic and has leukocytosis. On-call vascular surgeon Dr. Trula Slade at [name] was consulted by the ER physician. Patient transferred to [facility] for further management of his cellulitis and peripheral vascular disease.  Patient also has anemia and during last admission was noted to have stool for occult blood positive. Patient was taking NSAIDs and was instructed to stop and was advised further GI workup as outpatient.  Assessment & Plan:   Cellulitis of the left foot with vascular disease - patient has been placed on vancomycin and Zosyn- changed to rocephin -vascular surgery consulted: rescheduled procedure until cr improved   Normocytic normochromic anemia - recent admission patient had stool for occult blood positive. Patient was instructed to stop taking NSAIDs. Closely follow CBC.  -s/p 1 unit PRBC  - Pepcid.   Diabetes mellitus type 2 with peripheral vascular disease - hold metformin and sulfonylurea while inpatient. Patient is on  sliding scale coverage. Closely follow CBGs.  Hypertension - d/c lisinopril. Hold hydrochlorothiazide  Hyperlipidemia on statins.   Tobacco abuse - tobacco cessation counseling requested.  History of hepatitis C with possible cirrhosis and has undergone complete treatment for hepatitis C.  Chronic kidney disease stage III -has > 400 cc on PVR-- will start flomax and insert foley Renal U/S- medical disease -appreciate nephrology  Encephalopathy -d/c pain medications -decrease Neurontin -suspect related to meds and worsening kidney function  DVT prophylaxis: heparin Code Status: Full code.  Family Communication: discussed with daughter on phone- lives in [place] [id number] 6/18 Disposition Plan: Home.  Consults called: vascular surgery  Admission status: Inpatient.   Consultants:  Vascular surgery  Procedures:  Antimicrobials: Anti-infectives    Start     Dose/Rate Route Frequency Ordered Stop   07/27/15 1300  cefTRIAXone (ROCEPHIN) 1 g in dextrose 5 % 50 mL IVPB     1 g 100 mL/hr over 30 Minutes Intravenous Every 24 hours 07/27/15 1206     07/26/15 1100  cefUROXime (ZINACEF) 1.5 g in dextrose 5 % 50 mL IVPB  Status:  Discontinued     1.5 g 100 mL/hr over 30 Minutes Intravenous To ShortStay Surgical 07/25/15 1153 07/27/15 1206   07/25/15 1230  vancomycin (VANCOCIN) 1,500 mg in sodium chloride 0.9 % 500 mL IVPB  Status:  Discontinued     1,500 mg 250 mL/hr over 120 Minutes Intravenous Every 24 hours 07/25/15 1152 07/27/15 1206   07/25/15 0600  cefUROXime (ZINACEF) 1.5 g in dextrose 5 % 50 mL IVPB  Status:  Discontinued     1.5 g  100 mL/hr over 30 Minutes Intravenous To ShortStay Surgical 07/24/15 1322 07/25/15 1153   07/24/15 1800  vancomycin (VANCOCIN) 1,500 mg in sodium chloride 0.9 % 500 mL IVPB  Status:  Discontinued     1,500 mg 250 mL/hr over 120 Minutes Intravenous Every 24 hours 07/23/15 2104 07/25/15 1152   07/24/15 0030   piperacillin-tazobactam (ZOSYN) IVPB 3.375 g  Status:  Discontinued     3.375 g 12.5 mL/hr over 240 Minutes Intravenous Every 8 hours 07/23/15 2104 07/27/15 1206   07/23/15 2115  vancomycin (VANCOCIN) IVPB 1000 mg/200 mL premix     1,000 mg 200 mL/hr over 60 Minutes Intravenous NOW 07/23/15 2104 07/23/15 2342   07/23/15 1545  piperacillin-tazobactam (ZOSYN) IVPB 3.375 g     3.375 g 12.5 mL/hr over 240 Minutes Intravenous  Once 07/23/15 1544 07/23/15 1709   07/23/15 1545  vancomycin (VANCOCIN) IVPB 1000 mg/200 mL premix     1,000 mg 200 mL/hr over 60 Minutes Intravenous  Once 07/23/15 1544 07/23/15 1812     Subjective: No complaints, eating lunch  Objective: Filed Vitals:   07/30/15 0459 07/30/15 1445 07/30/15 1937 07/31/15 0355  BP: 131/68 142/78 158/98 141/74  Pulse: 87 92 81 95  Temp: 98.1 F (36.7 C) 98.4 F (36.9 C) 98.6 F (37 C) 99.8 F (37.7 C)  TempSrc: Oral Oral Oral Oral  Resp: 16 16 18 18   Height:      Weight:      SpO2: 98% 99% 99% 97%    Intake/Output Summary (Last 24 hours) at 07/31/15 1517 Last data filed at 07/31/15 1058  Gross per 24 hour  Intake    200 ml  Output   2700 ml  Net  -2500 ml   Filed Weights   07/23/15 1453  Weight: 90.266 kg (199 lb)    Exam: Respiratory: No rhonchi or palpitations. Cardiovascular: normal S1-S2 heard. Abdomen: Soft nontender bowel sounds present. Musculoskeletal: toes wrapped Neurologic: pleasant/cooperative   Data Reviewed: Basic Metabolic Panel:  Recent Labs Lab 07/27/15 1029 07/28/15 0433 07/29/15 0440 07/30/15 0411 07/31/15 0412  NA 136 137 136 136 135  K 3.8 4.0 3.8 4.1 4.1  CL 102 102 101 100* 101  CO2 26 26 25 27 26   GLUCOSE 214* 129* 178* 152* 160*  BUN 14 16 16 14 15   CREATININE 1.91* 2.03* 1.79* 1.80* 1.69*  CALCIUM 8.6* 8.7* 8.6* 8.6* 8.6*  PHOS  --   --  4.1 3.9 3.7   Liver Function Tests:  Recent Labs Lab 07/26/15 0431 07/29/15 0440 07/30/15 0411 07/31/15 0412  AST 54*   --   --   --   ALT 65*  --   --   --   ALKPHOS 68  --   --   --   BILITOT 0.6  --   --   --   PROT 7.0  --   --   --   ALBUMIN 2.3* 2.0* 2.0* 2.1*   No results for input(s): LIPASE, AMYLASE in the last 168 hours.  Recent Labs Lab 07/29/15 1110  AMMONIA 14   CBC:  Recent Labs Lab 07/25/15 0519 07/26/15 0431 07/28/15 0433 07/29/15 0440 07/30/15 1348  WBC 12.4* 12.8* 14.0* 11.4* 10.8*  HGB 7.2* 7.0* 6.8* 7.2* 7.7*  HCT 23.7* 23.1* 21.9* 23.5* 24.8*  MCV 85.9 86.5 86.9 86.1 86.4  PLT 308 354 442* 495* 544*   Cardiac Enzymes: No results for input(s): CKTOTAL, CKMB, CKMBINDEX, TROPONINI in the last 168 hours. BNP (last  3 results) No results for input(s): PROBNP in the last 8760 hours. CBG:  Recent Labs Lab 07/30/15 1208 07/30/15 1636 07/30/15 2109 07/31/15 0602 07/31/15 1216  GLUCAP 161* 187* 171* 179* 85    Recent Results (from the past 240 hour(s))  Blood culture (routine x 2)     Status: None   Collection Time: 07/23/15  3:51 PM  Result Value Ref Range Status   Specimen Description BLOOD LEFT ANTECUBITAL  Final   Special Requests BOTTLES DRAWN AEROBIC AND ANAEROBIC 4CC EACH  Final   Culture NO GROWTH 6 DAYS  Final   Report Status 07/29/2015 FINAL  Final  Blood culture (routine x 2)     Status: None   Collection Time: 07/23/15  4:30 PM  Result Value Ref Range Status   Specimen Description BLOOD SITE NOT SPECIFIED DRAWN BY RN  Final   Special Requests BOTTLES DRAWN AEROBIC AND ANAEROBIC [place]  Final   Culture NO GROWTH 6 DAYS  Final   Report Status 07/29/2015 FINAL  Final    Studies: No results found. Scheduled Meds: . sodium chloride   Intravenous Once  . cefTRIAXone (ROCEPHIN)  IV  1 g Intravenous Q24H  . famotidine  20 mg Oral Daily  . folic acid  1 mg Oral Daily  . gabapentin  300 mg Oral QHS  . insulin aspart  0-9 Units Subcutaneous TID WC  . multivitamin with minerals  1 tablet Oral Daily  . polyethylene glycol  17 g Oral Daily  . silver  sulfADIAZINE   Topical Daily  . simvastatin  20 mg Oral q1800  . tamsulosin  0.4 mg Oral Daily  . thiamine  100 mg Oral Daily   Or  . thiamine  100 mg Intravenous Daily   Continuous Infusions: . sodium chloride 100 mL/hr at 07/31/15 [id number]   Principal Problem:   Cellulitis L foot Active Problems:   Peripheral vascular disease, unspecified ([place])   Essential hypertension   Left foot infection   Type 2 diabetes mellitus with vascular disease (HCC)   Normocytic normochromic anemia   Cellulitis of left foot  Time spent: 25 min  Wood, DO [facility] Pager [phone]  If 7PM-7AM, please contact night-coverage [email or web address] Password [email or web address] 07/31/2015, 3:17 PM    LOS: 8 days

## 2015-08-01 ENCOUNTER — Inpatient Hospital Stay (HOSPITAL_COMMUNITY): Payer: Medicaid Other

## 2015-08-01 ENCOUNTER — Encounter (HOSPITAL_COMMUNITY)
Admission: EM | Disposition: A | Discharge status: Home / Self Care | Payer: Self-pay | Source: Home / Self Care | Attending: Internal Medicine

## 2015-08-01 DIAGNOSIS — I70248 Atherosclerosis of native arteries of left leg with ulceration of other part of lower left leg: Secondary | ICD-10-CM

## 2015-08-01 HISTORY — PX: PERIPHERAL VASCULAR CATHETERIZATION: SHX172C

## 2015-08-01 LAB — RENAL FUNCTION PANEL
ALBUMIN: 2 g/dL — AB (ref 3.5–5.0)
ANION GAP: 9 (ref 5–15)
BUN: 13 mg/dL (ref 6–20)
CALCIUM: 8.5 mg/dL — AB (ref 8.9–10.3)
CO2: 25 mmol/L (ref 22–32)
Chloride: 102 mmol/L (ref 101–111)
Creatinine, Ser: 1.72 mg/dL — ABNORMAL HIGH (ref 0.61–1.24)
GFR calc Af Amer: 47 mL/min — ABNORMAL LOW (ref >60)
GFR, EST NON AFRICAN AMERICAN: 41 mL/min — AB (ref >60)
GLUCOSE: 125 mg/dL — AB (ref 65–99)
PHOSPHORUS: 4.1 mg/dL (ref 2.5–4.6)
Potassium: 4.1 mmol/L (ref 3.5–5.1)
SODIUM: 136 mmol/L (ref 135–145)

## 2015-08-01 LAB — CBC
HCT: 23.7 % — ABNORMAL LOW (ref 39.0–52.0)
Hemoglobin: 7.3 g/dL — ABNORMAL LOW (ref 13.0–17.0)
MCH: 26.9 pg (ref 26.0–34.0)
MCHC: 30.8 g/dL (ref 30.0–36.0)
MCV: 87.5 fL (ref 78.0–100.0)
Platelets: 573 10*3/uL — ABNORMAL HIGH (ref 150–400)
RBC: 2.71 MIL/uL — ABNORMAL LOW (ref 4.22–5.81)
RDW: 14.2 % (ref 11.5–15.5)
WBC: 11.2 10*3/uL — AB (ref 4.0–10.5)

## 2015-08-01 LAB — GLUCOSE, CAPILLARY
GLUCOSE-CAPILLARY: 198 mg/dL — AB (ref 65–99)
Glucose-Capillary: 126 mg/dL — ABNORMAL HIGH (ref 65–99)
Glucose-Capillary: 146 mg/dL — ABNORMAL HIGH (ref 65–99)
Glucose-Capillary: 146 mg/dL — ABNORMAL HIGH (ref 65–99)
Glucose-Capillary: 254 mg/dL — ABNORMAL HIGH (ref 65–99)

## 2015-08-01 LAB — SURGICAL PCR SCREEN
MRSA, PCR: NEGATIVE
Staphylococcus aureus: POSITIVE — AB

## 2015-08-01 SURGERY — LOWER EXTREMITY ANGIOGRAPHY

## 2015-08-01 MED ORDER — CHLORHEXIDINE GLUCONATE CLOTH 2 % EX PADS
6.0000 | MEDICATED_PAD | Freq: Every day | CUTANEOUS | Status: DC
  Administered 2015-08-02 – 2015-08-04 (×3): 6 via TOPICAL

## 2015-08-01 MED ORDER — ACETAMINOPHEN 325 MG RE SUPP: 325.0000 mg | RECTAL | Status: DC | PRN

## 2015-08-01 MED ORDER — HEPARIN (PORCINE) IN NACL 2-0.9 UNIT/ML-% IJ SOLN
INTRAMUSCULAR | Status: AC
Start: 2015-08-01 — End: 2015-08-01
  Filled 2015-08-01: qty 1000

## 2015-08-01 MED ORDER — MORPHINE SULFATE (PF) 2 MG/ML IV SOLN
2.0000 mg | INTRAVENOUS | Status: DC | PRN
  Administered 2015-08-02 – 2015-08-10 (×3): 2 mg via INTRAVENOUS
  Filled 2015-08-01 (×2): qty 1

## 2015-08-01 MED ORDER — DOCUSATE SODIUM 100 MG PO CAPS
100.0000 mg | ORAL_CAPSULE | Freq: Every day | ORAL | Status: DC
  Administered 2015-08-02 – 2015-08-10 (×8): 100 mg via ORAL
  Filled 2015-08-01 (×9): qty 1

## 2015-08-01 MED ORDER — SODIUM CHLORIDE 0.9 % IV SOLN: INTRAVENOUS | Status: DC

## 2015-08-01 MED ORDER — HYDRALAZINE HCL 20 MG/ML IJ SOLN
INTRAMUSCULAR | Status: AC
  Filled 2015-08-01: qty 1

## 2015-08-01 MED ORDER — LIDOCAINE HCL (PF) 1 % IJ SOLN
INTRAMUSCULAR | Status: DC | PRN
  Administered 2015-08-01: 12 mL

## 2015-08-01 MED ORDER — ONDANSETRON HCL 4 MG/2ML IJ SOLN
4.0000 mg | Freq: Four times a day (QID) | INTRAMUSCULAR | Status: DC | PRN
  Administered 2015-08-01: 4 mg via INTRAVENOUS

## 2015-08-01 MED ORDER — METOPROLOL TARTRATE 5 MG/5ML IV SOLN
2.0000 mg | INTRAVENOUS | Status: DC | PRN
Start: 2015-08-01 — End: 2015-08-10

## 2015-08-01 MED ORDER — MUPIROCIN 2 % EX OINT
1.0000 "application " | TOPICAL_OINTMENT | Freq: Two times a day (BID) | CUTANEOUS | Status: AC
  Administered 2015-08-01 – 2015-08-05 (×9): 1 via NASAL
  Filled 2015-08-01 (×4): qty 22

## 2015-08-01 MED ORDER — MIDAZOLAM HCL 2 MG/2ML IJ SOLN
INTRAMUSCULAR | Status: AC
  Filled 2015-08-01: qty 2

## 2015-08-01 MED ORDER — ALUM & MAG HYDROXIDE-SIMETH 200-200-20 MG/5ML PO SUSP: 15.0000 mL | ORAL | Status: DC | PRN

## 2015-08-01 MED ORDER — LABETALOL HCL 5 MG/ML IV SOLN
10.0000 mg | INTRAVENOUS | Status: AC | PRN
  Administered 2015-08-03 – 2015-08-05 (×4): 10 mg via INTRAVENOUS
  Filled 2015-08-01 (×4): qty 4

## 2015-08-01 MED ORDER — ONDANSETRON HCL 4 MG/2ML IJ SOLN
INTRAMUSCULAR | Status: AC
  Filled 2015-08-01: qty 2

## 2015-08-01 MED ORDER — PHENOL 1.4 % MT LIQD: 1.0000 | OROMUCOSAL | Status: DC | PRN

## 2015-08-01 MED ORDER — HYDRALAZINE HCL 20 MG/ML IJ SOLN
5.0000 mg | INTRAMUSCULAR | Status: AC | PRN
  Administered 2015-08-01 (×2): 5 mg via INTRAVENOUS

## 2015-08-01 MED ORDER — IODIXANOL 320 MG/ML IV SOLN
INTRAVENOUS | Status: DC | PRN
  Administered 2015-08-01: 50 mL via INTRAVENOUS

## 2015-08-01 MED ORDER — LABETALOL HCL 5 MG/ML IV SOLN
INTRAVENOUS | Status: AC
  Filled 2015-08-01: qty 4

## 2015-08-01 MED ORDER — MORPHINE SULFATE (PF) 10 MG/ML IV SOLN: 2.0000 mg | INTRAVENOUS | Status: DC | PRN

## 2015-08-01 MED ORDER — FENTANYL CITRATE (PF) 100 MCG/2ML IJ SOLN
INTRAMUSCULAR | Status: DC | PRN
  Administered 2015-08-01: 50 ug via INTRAVENOUS

## 2015-08-01 MED ORDER — HEPARIN (PORCINE) IN NACL 2-0.9 UNIT/ML-% IJ SOLN
INTRAMUSCULAR | Status: DC | PRN
Start: 2015-08-01 — End: 2015-08-01
  Administered 2015-08-01: 1000 mL

## 2015-08-01 MED ORDER — FENTANYL CITRATE (PF) 100 MCG/2ML IJ SOLN
INTRAMUSCULAR | Status: AC
  Filled 2015-08-01: qty 2

## 2015-08-01 MED ORDER — LIDOCAINE HCL (PF) 1 % IJ SOLN
INTRAMUSCULAR | Status: AC
  Filled 2015-08-01: qty 30

## 2015-08-01 MED ORDER — MIDAZOLAM HCL 2 MG/2ML IJ SOLN
INTRAMUSCULAR | Status: DC | PRN
  Administered 2015-08-01: 2 mg via INTRAVENOUS

## 2015-08-01 MED ORDER — ACETAMINOPHEN 325 MG PO TABS: 325.0000 mg | ORAL_TABLET | ORAL | Status: DC | PRN

## 2015-08-01 MED ORDER — OXYCODONE-ACETAMINOPHEN 5-325 MG PO TABS
2.0000 | ORAL_TABLET | Freq: Four times a day (QID) | ORAL | Status: DC | PRN
Start: 2015-08-01 — End: 2015-08-10
  Administered 2015-08-01 – 2015-08-10 (×32): 2 via ORAL
  Filled 2015-08-01 (×32): qty 2

## 2015-08-01 MED ORDER — GUAIFENESIN-DM 100-10 MG/5ML PO SYRP: 15.0000 mL | ORAL_SOLUTION | ORAL | Status: DC | PRN

## 2015-08-01 SURGICAL SUPPLY — 18 items
CATH OMNI FLUSH 5F 65CM (CATHETERS) ×4 IMPLANT
CATH SOFT-VU 4F 65 STRAIGHT (CATHETERS) ×2 IMPLANT
CATH SOFT-VU STRAIGHT 4F 65CM (CATHETERS) ×3
COVER PRB 48X5XTLSCP FOLD TPE (BAG) ×2 IMPLANT
COVER PROBE 5X48 (BAG) ×2
DEVICE TORQUE H2O (MISCELLANEOUS) ×4 IMPLANT
FILTER CO2 0.2 MICRON (VASCULAR PRODUCTS) ×4 IMPLANT
GUIDEWIRE ANGLED .035X150CM (WIRE) ×4 IMPLANT
KIT MICROINTRODUCER STIFF 5F (SHEATH) ×4 IMPLANT
KIT PV (KITS) ×4 IMPLANT
RESERVOIR CO2 (VASCULAR PRODUCTS) ×4 IMPLANT
SET FLUSH CO2 (MISCELLANEOUS) ×4 IMPLANT
SHEATH PINNACLE 5F 10CM (SHEATH) ×4 IMPLANT
SHIELD RADPAD SCOOP 12X17 (MISCELLANEOUS) ×4 IMPLANT
SYR MEDRAD MARK V 150ML (SYRINGE) ×4 IMPLANT
TRANSDUCER W/STOPCOCK (MISCELLANEOUS) ×4 IMPLANT
TRAY PV CATH (CUSTOM PROCEDURE TRAY) ×4 IMPLANT
WIRE BENTSON .035X145CM (WIRE) ×4 IMPLANT

## 2015-08-01 NOTE — Progress Notes (Signed)
Rept to [facility] on [address]. Per cardiac cath's rept, pt is to be transferred to 2W11 after cardiac procedure.

## 2015-08-01 NOTE — Care Management Note (Signed)
Case Management Note Marvetta Gibbons RN, BSN Unit 2W-Case Manager [phone]  Patient Details  Name: Cory Weber MRN: [id number] Date of Birth: October 04, 1950  Subjective/Objective:    Pt admitted with cellulitis     tx from 5N to 2W post cath on 08/01/15- CM to follow for d/c needs            Action/Plan: PTA pt lived at home alone uses walker, has family support in area.   Expected Discharge Date:                  Expected Discharge Plan:  [place]  In-House Referral:     Discharge planning Services  CM Consult  Post Acute Care Choice:    Choice offered to:     DME Arranged:    DME Agency:     HH Arranged:    [place] Agency:     Status of Service:  In process, will continue to follow  If discussed at Long Length of Stay Meetings, dates discussed:    Additional Comments:  Dawayne Patricia, RN 08/01/2015, 3:38 PM

## 2015-08-01 NOTE — H&P (View-Only) (Signed)
Consult Note  Patient name: Cory Weber MRN: [id number] DOB: 02-19-1950 Sex: male  Consulting Physician:  Dr. Hal Hope  Reason for Consult:  Chief Complaint  Patient presents with  . Foot Pain    HISTORY OF PRESENT ILLNESS: This is a 65 year old gentleman who presented with Left foot cellulitis after immersing his foot in hot water.  Because of his diabetic neuropathy, he was unaware of how hot the water was.  He was admitted and started on IV antibiotics.  The patient is a diabetic.  His hemoglobin A1c is 7.0.  He is a former smoker.  He takes Zocor for hypercholesterolemia.  He is on multiple medications for hypertension.  ABIs are 0.91 on the right and 0.45 on the left  Past Medical History  Diagnosis Date  . Diabetes mellitus   . Hypertension   . Hepatitis C     states he was diagnosed in 2007 or 2007 while living in [place], [place]  . CVA (cerebral infarction) 01/2011  . Hyperlipidemia   . Asthma   . PVD (peripheral vascular disease) ([place])   . Anemia   . Stroke ([place])   . Headache(784.0)   . Peripheral neuropathy [facility])     Past Surgical History  Procedure Laterality Date  . Liver biopsy  2005    Done in [place], [place]. Chronic hepatitis with mild periportal inflammation, lobular unicellular necrosis and portal fibrosis. Grade 2, stage 1-2.  Marland Kitchen Colonoscopy with propofol N/A 09/03/2012    [id number] polyp-removed as outlined above. Prominent internal hemorrhoids. Tubular adenoma  . Esophagogastroduodenoscopy (egd) with propofol N/A 09/03/2012    [id number] hernia. Gastric diverticulum. Gastric ulcers with associated erosions. Duodenal erosions. Status post gastric biopsy. H.PYLORI gastritis   . Biopsy N/A 09/03/2012    Procedure: BIOPSY;  Surgeon: Daneil Dolin, MD;  Location: [facility];  Service: Endoscopy;  Laterality: N/A;  gastric and gastric mucosa  . Polypectomy N/A 09/03/2012    Procedure: POLYPECTOMY;  Surgeon: Daneil Dolin, MD;  Location: [facility];   Service: Endoscopy;  Laterality: N/A;  cecal polyp  . Esophagogastroduodenoscopy (egd) with propofol N/A 12/03/2012    Dr. Gala Romney: gastric diverticulum, gastric erosions and scar. Previously noted gastric ulcer completed healed. Biopsy without H.pylori.   . Biopsy N/A 12/03/2012    Procedure: BIOPSY;  Surgeon: Daneil Dolin, MD;  Location: [facility];  Service: Endoscopy;  Laterality: N/A;  . Maximum access (mas)posterior lumbar interbody fusion (plif) 1 level N/A 11/25/2013    Procedure: FOR MAXIMUM ACCESS (MAS) POSTERIOR LUMBAR INTERBODY FUSION (PLIF) 1 LEVEL;  Surgeon: Eustace Moore, MD;  Location: [place] [facility];  Service: Neurosurgery;  Laterality: N/A;  FOR MAXIMUM ACCESS (MAS) POSTERIOR LUMBAR INTERBODY FUSION (PLIF) 1 LEVEL LUMBAR 3-4    Social History   Social History  . Marital Status: Single    Spouse Name: N/A  . Number of Children: 1  . Years of Education: N/A   Occupational History  . trying to get disability    Social History Main Topics  . Smoking status: Former Smoker -- 0.50 packs/day for 20 years    Types: Cigarettes  . Smokeless tobacco: Never Used     Comment: given 1-800-Quit Now  . Alcohol Use: No  . Drug Use: No     Comment: last used April 2016.  Marland Kitchen Sexual Activity: Not on file   Other Topics Concern  . Not on file   Social History Narrative  Family History  Problem Relation Age of Onset  . Breast cancer Mother     deceased  . Cancer Mother   . Diabetes Father   . Hypertension Father   . Heart disease Father     deceased  . Hyperlipidemia Father   . Diabetes Sister   . Hypertension Sister   . Breast cancer Sister   . Colon cancer Neg Hx   . Liver disease Neg Hx     Allergies as of 07/23/2015  . (No Known Allergies)    No current facility-administered medications on file prior to encounter.   Current Outpatient Prescriptions on File Prior to Encounter  Medication Sig Dispense Refill  . doxycycline (VIBRAMYCIN) 50 MG capsule Take 1  capsule (50 mg total) by mouth 2 (two) times daily. 10 capsule 0  . gabapentin (NEURONTIN) 300 MG capsule Take 1 capsule (300 mg total) by mouth 2 (two) times daily. 60 capsule 1  . glipiZIDE (GLUCOTROL) 5 MG tablet Take 5 mg by mouth 2 (two) times daily before a meal.     . lisinopril-hydrochlorothiazide (PRINZIDE,ZESTORETIC) 20-12.5 MG per tablet Take 1 tablet by mouth 2 (two) times daily. 30 tablet 1  . metFORMIN (GLUCOPHAGE) 1000 MG tablet Take 1 tablet (1,000 mg total) by mouth 2 (two) times daily with a meal. 60 tablet 1  . simvastatin (ZOCOR) 20 MG tablet TAKE ONE TABLET BY MOUTH DAILY AT 6 PM. 30 tablet 0  . aspirin EC 81 MG tablet Take 1 tablet (81 mg total) by mouth daily. 100 tablet 3  . [DISCONTINUED] ferrous fumarate (HEMOCYTE - 106 MG FE) 325 (106 FE) MG TABS tablet Take 1 tablet (106 mg of iron total) by mouth 2 (two) times daily. (Patient not taking: Reported on 04/01/2014) 60 each 0     REVIEW OF SYSTEMS: Cardiovascular: No chest pain, chest pressure, palpitations, orthopnea, or dyspnea on exertion. No claudication or rest pain,  No history of DVT or phlebitis. Pulmonary: No productive cough, asthma or wheezing. Neurologic: No weakness, paresthesias, aphasia, or amaurosis. No dizziness. Hematologic: No bleeding problems or clotting disorders. Musculoskeletal: No joint pain or joint swelling. Gastrointestinal: No blood in stool or hematemesis Genitourinary: No dysuria or hematuria. Psychiatric:: No history of major depression. Integumentary: Left foot blistering and redness Constitutional: No fever or chills.  PHYSICAL EXAMINATION: General: The patient appears their stated age.  Vital signs are BP 128/78 mmHg  Pulse 100  Temp(Src) 99.8 F (37.7 C) (Oral)  Resp 18  Ht 6\' 1"  (1.854 m)  Wt 199 lb (90.266 kg)  BMI 26.26 kg/m2  SpO2 100% Pulmonary: Respirations are non-labored HEENT:  No gross abnormalities Abdomen: Soft and non-tender  Musculoskeletal: There are no  major deformities.   Neurologic: No focal weakness or paresthesias are detected, Skin: Gangrenous changes to the great toe and fourth toe with blistering on the dorsum of the foot and surrounding erythema. Psychiatric: The patient has normal affect. Cardiovascular: There is a regular rate and rhythm without significant murmur appreciated.  Diagnostic Studies: I have reviewed his ABIs.  The left is 0.45 of the right is 0.91    Assessment:  Left foot ulcer Plan: I discussed with the patient and his daughter that he is at risk for limb loss given the tissue damage on the left foot with infection as well as in combination with his lower extremity vascular disease and diabetes.  Next step is to proceed with angiography to better define his anatomy and to intervene if feasible.  This will be done through a right femoral approach.  Contrast utilization will be minimized because of his creatinine.  Continue to hydrate him as well as continue IV antibiotics and strict blood glucose control      V. Leia Alf, M.D. [facility] Office: [phone] Pager:  [phone]

## 2015-08-01 NOTE — Interval H&P Note (Signed)
History and Physical Interval Note:  08/01/2015 9:53 AM  Cory Weber  has presented today for surgery, with the diagnosis of claudicaiton  The various methods of treatment have been discussed with the patient and family. After consideration of risks, benefits and other options for treatment, the patient has consented to  Procedure(s): Lower Extremity Angiography (N/A) as a surgical intervention .  The patient's history has been reviewed, patient examined, no change in status, stable for surgery.  I have reviewed the patient's chart and labs.  Questions were answered to the patient's satisfaction.     Trudy Kory, [place]  Creatinine improved but not yet back to baseline.  Because of the ischemic nature of the patient's foot we'll proceed with angiography with minimal contrast

## 2015-08-01 NOTE — Op Note (Signed)
    Patient name: Cory Weber MRN: [id number] DOB: 08-19-1950 Sex: male  07/23/2015 - 08/01/2015 Pre-operative Diagnosis: left leg ulcer Post-operative diagnosis:  Same Surgeon:  Annamarie Major Procedure Performed:  1.  U/s guided access right femoral artery  2.  Abdominal aortogram with CO2  3.  2nd order catheterization  4.  Left leg runoff  5.  Conscious sedation x 20 minutes     Indications:  The patient presented with a left leg ulcer and decreased ABI's.  His angio was delayed due to acute renal failure.  Procedure:  The patient was identified in the holding area and taken to room 8.  The patient was then placed supine on the table and prepped and draped in the usual sterile fashion.  A time out was called.  Conscious sedation was performed with the use of IV fentanyl and versed under continuous monitoring by myself and the circulating nurse who were present face to face for the duration of the sedation.  HR, O2 sat and BP were continuously monitored.  Ultrasound was used to evaluate the right common femoral artery.  It was patent .  A digital ultrasound image was acquired.  A micropuncture needle was used to access the right common femoral artery under ultrasound guidance.  An 018 wire was advanced without resistance and a micropuncture sheath was placed.  The 018 wire was removed and a benson wire was placed.  The micropuncture sheath was exchanged for a 5 french sheath.  An omniflush catheter was advanced over the wire to the level of L-1.  An abdominal angiogram was obtained with CO2.  .  Next, using the omniflush catheter and a benson wire, the aortic bifurcation was crossed and a 62F straight catheter was placed into theleft external iliac artery and left runoff was obtained.  Findings:   Aortogram:  No significant renal artery stenosis was identified.  No significant iliac stenosis was identified bilaterally  Right Lower Extremity:  Not evaluated  Left Lower Extremity:  The left  CFA and PFA were widely patent.  The SFA was patent with calcification and mild stenosis were identified at the adductor cannal.  The popliteal artery was occluded just above the joint space with geniculate collaterals reconstituting the PT and peroneal.  THe PT crosses the ankle  Intervention:  none  Impression:  #1  50 cc of contrast was utilized  #2  Left popliteal occlusion with reconstitution of the PT and peroneal  #3  The patient will be considered for surgical bypass versus an attempt at percutaneous revascularization     V. Annamarie Major, M.D. [facility] Office: [phone] Pager:  [phone]

## 2015-08-01 NOTE — Progress Notes (Signed)
Pt transferred via stretcher to cath lab without incident per MD order. Rept to RN in cath lab that pt's hgb this AM 7.3. Last hgb on 6/18 7.7. Hgb reported to Dr. Eliseo Squires this AM. No new orders. No change in pt from AM assessment. VSS.

## 2015-08-01 NOTE — Progress Notes (Signed)
Patient arrived to 2W from the cath lab in no apparent distress.  Patient was oriented to unit and room to include phone and call light.  Patient was instructed on bed rest after cath protocol.  Telemetry monitor was applied and CCMD notified.  Will continue to monitor.

## 2015-08-01 NOTE — Progress Notes (Signed)
PROGRESS NOTE    Cory Weber  [id number]  DOB: [birth year]  DOA: 07/23/2015 PCP: Maggie Font, MD Outpatient Specialists:  Hospital course: Cory Weber is a 65 y.o. male with past medical history significant for diabetes mellitus type 2, hypertension, hyperlipidemia and tobacco abuse who was recently admitted for left foot cellulitis after patient had immeresed his foot in hot water. But due to his neuropathy he did not realize how hot the water was. At that time patient had blister on his right great toe which gone down but developed wound around the great toe. Patient was admitted and placed on antibiotics and vascular surgeon was consulted and ABI was 0.4. Patient was referred to Vascular surgeon as outpatient with Dr. Bridgett Larsson. Patient states his pain worsened last 2-3 days after discharge with new blisters forming on the dorsal aspect of his left foot around the lateral toes. There is also gangrenous appearing left toe. Patient is mildly tachycardic and has leukocytosis. On-call vascular surgeon Dr. Trula Slade at [name] was consulted by the ER physician. Patient transferred to [facility] for further management of his cellulitis and peripheral vascular disease.  Patient also has anemia and during last admission was noted to have stool for occult blood positive. Patient was taking NSAIDs and was instructed to stop and was advised further GI workup as outpatient.  Assessment & Plan:   Cellulitis of the left foot with vascular disease - patient has been placed on vancomycin and Zosyn- changed to rocephin -s/p angiogram: shows popliteal occlusion--- Considering femoral - posterior tibial bypass  Per Dr. Trula Slade: Patient will need cardiac clearance-- will get echo and cards consult Will obtain vein mapping and carotid duplex for pre-operative purposes  Normocytic normochromic anemia - recent admission patient had stool for occult blood positive. Patient was instructed to stop taking  NSAIDs. Closely follow CBC.  -s/p 1 unit PRBC  - Pepcid.   Diabetes mellitus type 2 with peripheral vascular disease - hold metformin and sulfonylurea while inpatient. Patient is on sliding scale coverage. Closely follow CBGs.  Hypertension - d/c lisinopril. Hold hydrochlorothiazide  Hyperlipidemia on statins.   Tobacco abuse - tobacco cessation counseling requested.  History of hepatitis C with possible cirrhosis and has undergone complete treatment for hepatitis C.  Chronic kidney disease stage III -has > 400 cc on PVR-- will start flomax and insert foley Renal U/S- medical disease -appreciate nephrology  Encephalopathy -d/c pain medications -decrease Neurontin -resolved  DVT prophylaxis: heparin Code Status: Full code.  Family Communication: discussed with daughter on phone- lives in [place] [phone]  Disposition Plan: Home.  Consults called: vascular surgery  Admission status: Inpatient.   Consultants:  Vascular surgery  Procedures:  Antimicrobials: Anti-infectives    Start     Dose/Rate Route Frequency Ordered Stop   07/27/15 1300  cefTRIAXone (ROCEPHIN) 1 g in dextrose 5 % 50 mL IVPB     1 g 100 mL/hr over 30 Minutes Intravenous Every 24 hours 07/27/15 1206     07/26/15 1100  cefUROXime (ZINACEF) 1.5 g in dextrose 5 % 50 mL IVPB  Status:  Discontinued     1.5 g 100 mL/hr over 30 Minutes Intravenous To ShortStay Surgical 07/25/15 1153 07/27/15 1206   07/25/15 1230  vancomycin (VANCOCIN) 1,500 mg in sodium chloride 0.9 % 500 mL IVPB  Status:  Discontinued     1,500 mg 250 mL/hr over 120 Minutes Intravenous Every 24 hours 07/25/15 1152 07/27/15 1206   07/25/15 0600  cefUROXime (  ZINACEF) 1.5 g in dextrose 5 % 50 mL IVPB  Status:  Discontinued     1.5 g 100 mL/hr over 30 Minutes Intravenous To ShortStay Surgical 07/24/15 1322 07/25/15 1153   07/24/15 1800  vancomycin (VANCOCIN) 1,500 mg in sodium chloride 0.9 % 500 mL IVPB  Status:   Discontinued     1,500 mg 250 mL/hr over 120 Minutes Intravenous Every 24 hours 07/23/15 2104 07/25/15 1152   07/24/15 0030  piperacillin-tazobactam (ZOSYN) IVPB 3.375 g  Status:  Discontinued     3.375 g 12.5 mL/hr over 240 Minutes Intravenous Every 8 hours 07/23/15 2104 07/27/15 1206   07/23/15 2115  vancomycin (VANCOCIN) IVPB 1000 mg/200 mL premix     1,000 mg 200 mL/hr over 60 Minutes Intravenous NOW 07/23/15 2104 07/23/15 2342   07/23/15 1545  piperacillin-tazobactam (ZOSYN) IVPB 3.375 g     3.375 g 12.5 mL/hr over 240 Minutes Intravenous  Once 07/23/15 1544 07/23/15 1709   07/23/15 1545  vancomycin (VANCOCIN) IVPB 1000 mg/200 mL premix     1,000 mg 200 mL/hr over 60 Minutes Intravenous  Once 07/23/15 1544 07/23/15 1812     Subjective: No complaints  Objective: Filed Vitals:   08/01/15 1310 08/01/15 1315 08/01/15 1320 08/01/15 1349  BP: 154/72 144/75 143/73 148/77  Pulse: 91 89 93 88  Temp:    98.6 F (37 C)  TempSrc:    Oral  Resp: 22 22 23 20   Height:      Weight:      SpO2: 99% 99% 99% 97%    Intake/Output Summary (Last 24 hours) at 08/01/15 1414 Last data filed at 08/01/15 1311  Gross per 24 hour  Intake    120 ml  Output   1800 ml  Net  -1680 ml   Filed Weights   07/23/15 1453  Weight: 90.266 kg (199 lb)    Exam: Respiratory: No rhonchi or palpitations. Cardiovascular: normal S1-S2 heard. Abdomen: Soft nontender bowel sounds present. Musculoskeletal: toes wrapped Neurologic: pleasant/cooperative   Data Reviewed: Basic Metabolic Panel:  Recent Labs Lab 07/28/15 0433 07/29/15 0440 07/30/15 0411 07/31/15 0412 08/01/15 0307  NA 137 136 136 135 136  K 4.0 3.8 4.1 4.1 4.1  CL 102 101 100* 101 102  CO2 26 25 27 26 25   GLUCOSE 129* 178* 152* 160* 125*  BUN 16 16 14 15 13   CREATININE 2.03* 1.79* 1.80* 1.69* 1.72*  CALCIUM 8.7* 8.6* 8.6* 8.6* 8.5*  PHOS  --  4.1 3.9 3.7 4.1   Liver Function Tests:  Recent Labs Lab 07/26/15 0431  07/29/15 0440 07/30/15 0411 07/31/15 0412 08/01/15 0307  AST 54*  --   --   --   --   ALT 65*  --   --   --   --   ALKPHOS 68  --   --   --   --   BILITOT 0.6  --   --   --   --   PROT 7.0  --   --   --   --   ALBUMIN 2.3* 2.0* 2.0* 2.1* 2.0*   No results for input(s): LIPASE, AMYLASE in the last 168 hours.  Recent Labs Lab 07/29/15 1110  AMMONIA 14   CBC:  Recent Labs Lab 07/26/15 0431 07/28/15 0433 07/29/15 0440 07/30/15 1348 08/01/15 0307  WBC 12.8* 14.0* 11.4* 10.8* 11.2*  HGB 7.0* 6.8* 7.2* 7.7* 7.3*  HCT 23.1* 21.9* 23.5* 24.8* 23.7*  MCV 86.5 86.9 86.1 86.4 87.5  PLT 354 442* 495* 544* 573*   Cardiac Enzymes: No results for input(s): CKTOTAL, CKMB, CKMBINDEX, TROPONINI in the last 168 hours. BNP (last 3 results) No results for input(s): PROBNP in the last 8760 hours. CBG:  Recent Labs Lab 07/31/15 1656 07/31/15 1940 08/01/15 0446 08/01/15 0807 08/01/15 1240  GLUCAP 130* 105* 126* 146* 146*    Recent Results (from the past 240 hour(s))  Blood culture (routine x 2)     Status: None   Collection Time: 07/23/15  3:51 PM  Result Value Ref Range Status   Specimen Description BLOOD LEFT ANTECUBITAL  Final   Special Requests BOTTLES DRAWN AEROBIC AND ANAEROBIC 4CC EACH  Final   Culture NO GROWTH 6 DAYS  Final   Report Status 07/29/2015 FINAL  Final  Blood culture (routine x 2)     Status: None   Collection Time: 07/23/15  4:30 PM  Result Value Ref Range Status   Specimen Description BLOOD SITE NOT SPECIFIED DRAWN BY RN  Final   Special Requests BOTTLES DRAWN AEROBIC AND ANAEROBIC 8CC EACH  Final   Culture NO GROWTH 6 DAYS  Final   Report Status 07/29/2015 FINAL  Final  Surgical pcr screen     Status: Abnormal   Collection Time: 07/31/15  9:51 PM  Result Value Ref Range Status   MRSA, PCR NEGATIVE NEGATIVE Final   Staphylococcus aureus POSITIVE (A) NEGATIVE Final    Comment:        The Xpert SA Assay (FDA approved for NASAL specimens in patients  over 31 years of age), is one component of a comprehensive surveillance program.  Test performance has been validated by [facility] for patients greater than or equal to 78 year old. It is not intended to diagnose infection nor to guide or monitor treatment.     Studies: No results found. Scheduled Meds: . sodium chloride   Intravenous Once  . cefTRIAXone (ROCEPHIN)  IV  1 g Intravenous Q24H  . Chlorhexidine Gluconate Cloth  6 each Topical Daily  . [START ON 08/02/2015] docusate sodium  100 mg Oral Daily  . famotidine  20 mg Oral Daily  . folic acid  1 mg Oral Daily  . gabapentin  300 mg Oral QHS  . heparin subcutaneous  5,000 Units Subcutaneous Q8H  . insulin aspart  0-9 Units Subcutaneous TID WC  . multivitamin with minerals  1 tablet Oral Daily  . mupirocin ointment  1 application Nasal BID  . polyethylene glycol  17 g Oral Daily  . silver sulfADIAZINE   Topical Daily  . simvastatin  20 mg Oral q1800  . tamsulosin  0.4 mg Oral Daily  . thiamine  100 mg Oral Daily   Or  . thiamine  100 mg Intravenous Daily   Continuous Infusions: . sodium chloride 200 mL (08/01/15 1311)   Principal Problem:   Cellulitis L foot Active Problems:   Peripheral vascular disease, unspecified ([place])   Essential hypertension   Left foot infection   Type 2 diabetes mellitus with vascular disease (HCC)   Normocytic normochromic anemia   Cellulitis of left foot  Time spent: 25 min  [place], DO [facility] Pager [phone]  If 7PM-7AM, please contact night-coverage [email or web address] Password [email or web address] 08/01/2015, 2:14 PM    LOS: 9 days

## 2015-08-01 NOTE — Progress Notes (Signed)
PT Cancellation Note  Patient Details Name: Cory Weber MRN: [id number] DOB: 1950-09-21   Cancelled Treatment:    Reason Eval/Treat Not Completed: Patient not medically ready. Discussed pt case with RN who states that pt is going to surgery today for angiogram with evaluation for possible amputation. Will continue to follow and complete PT eval when able.    Rolinda Roan  08/01/2015, 10:19 AM   Rolinda Roan, PT, DPT Acute Rehabilitation Services Pager: [phone]

## 2015-08-01 NOTE — Progress Notes (Signed)
Angiogram shows popliteal occlusion Considering femoral - posterior tibial bypass 50cc IV contrast given for procedure  Patient will need cardiac clearance Will obtain vein mapping and carotid duplex for pre-operative purposes  WElls Brabham

## 2015-08-01 NOTE — Progress Notes (Signed)
Site area: RFA Site Prior to Removal:  Level 0 Pressure Applied For:81min Manual:   yes Patient Status During Pull:  Stable/experienced nausea Post Pull Site:  Level 0 Post Pull Instructions Given:yes   Post Pull Pulses Present:doppler  Dressing Applied:  tegaderm Bedrest begins @ 1325 till 1725 Comments:

## 2015-08-01 NOTE — Progress Notes (Signed)
Rept to Dr. Eliseo Squires regarding pt's hgb this AM 7.3 and holding heparin injections until after procedure this AM. Pt VSS. No new orders at this time.

## 2015-08-02 ENCOUNTER — Inpatient Hospital Stay (HOSPITAL_COMMUNITY): Payer: Medicaid Other

## 2015-08-02 ENCOUNTER — Encounter (HOSPITAL_COMMUNITY): Payer: Self-pay | Admitting: Surgery

## 2015-08-02 DIAGNOSIS — Z01810 Encounter for preprocedural cardiovascular examination: Secondary | ICD-10-CM

## 2015-08-02 LAB — VAS US CAROTID
LCCADDIAS: 26 cm/s
LCCADSYS: 97 cm/s
LEFT ECA DIAS: -19 cm/s
LEFT VERTEBRAL DIAS: 18 cm/s
LICADDIAS: -35 cm/s
LICADSYS: -69 cm/s
LICAPSYS: -111 cm/s
Left CCA prox dias: 28 cm/s
Left CCA prox sys: 89 cm/s
Left ICA prox dias: -39 cm/s
RCCADSYS: -91 cm/s
RCCAPSYS: 139 cm/s
RIGHT ECA DIAS: -9 cm/s
RIGHT VERTEBRAL DIAS: 9 cm/s
Right CCA prox dias: 30 cm/s

## 2015-08-02 LAB — GLUCOSE, CAPILLARY
GLUCOSE-CAPILLARY: 162 mg/dL — AB (ref 65–99)
GLUCOSE-CAPILLARY: 181 mg/dL — AB (ref 65–99)
Glucose-Capillary: 170 mg/dL — ABNORMAL HIGH (ref 65–99)
Glucose-Capillary: 208 mg/dL — ABNORMAL HIGH (ref 65–99)

## 2015-08-02 LAB — RENAL FUNCTION PANEL
ANION GAP: 6 (ref 5–15)
Albumin: 2 g/dL — ABNORMAL LOW (ref 3.5–5.0)
BUN: 11 mg/dL (ref 6–20)
CHLORIDE: 102 mmol/L (ref 101–111)
CO2: 29 mmol/L (ref 22–32)
Calcium: 8.8 mg/dL — ABNORMAL LOW (ref 8.9–10.3)
Creatinine, Ser: 1.67 mg/dL — ABNORMAL HIGH (ref 0.61–1.24)
GFR, EST AFRICAN AMERICAN: 49 mL/min — AB (ref >60)
GFR, EST NON AFRICAN AMERICAN: 42 mL/min — AB (ref >60)
Glucose, Bld: 162 mg/dL — ABNORMAL HIGH (ref 65–99)
POTASSIUM: 4.3 mmol/L (ref 3.5–5.1)
Phosphorus: 4.2 mg/dL (ref 2.5–4.6)
Sodium: 137 mmol/L (ref 135–145)

## 2015-08-02 LAB — CBC
HEMATOCRIT: 25.8 % — AB (ref 39.0–52.0)
HEMOGLOBIN: 8 g/dL — AB (ref 13.0–17.0)
MCH: 26.8 pg (ref 26.0–34.0)
MCHC: 31 g/dL (ref 30.0–36.0)
MCV: 86.6 fL (ref 78.0–100.0)
Platelets: 644 10*3/uL — ABNORMAL HIGH (ref 150–400)
RBC: 2.98 MIL/uL — AB (ref 4.22–5.81)
RDW: 14.3 % (ref 11.5–15.5)
WBC: 11.7 10*3/uL — AB (ref 4.0–10.5)

## 2015-08-02 NOTE — Progress Notes (Signed)
*  PRELIMINARY RESULTS* Vascular Ultrasound Carotid Duplex (Doppler) has been completed.  Preliminary findings: Bilateral: No significant (1-39%) ICA stenosis. Antegrade vertebral flow.      +++Lower Extremity Vein Map+++    Right Great Saphenous Vein   Segment Diameter Comment  1. Origin 4.30mm   2. High Thigh 3.51mm   3. Mid Thigh 2.47mm   4. Low Thigh 3.29mm branch  5. At Knee 2.47mm   6. High Calf 2.32mm Thick walls  7. Low Calf 3.68mm Thick walls, branch  8. Ankle 2.87mm    mm    mm    mm     Right Small Saphenous Vein  Segment Diameter Comment  1. Origin 3.27mm   2. High Calf 3.1mm   3. Low Calf 2.30mm Thick walls  4. Ankle 2.57mm Thick walls, branch   mm    mm    mm       Left Great Saphenous Vein   Segment Diameter Comment  1. Origin 4.37mm   2. High Thigh 3.16mm   3. Mid Thigh 3.20mm   4. Low Thigh 3.49mm   5. At Knee 2.38mm   6. High Calf 2.83mm   7. Low Calf 2.54mm   8. Ankle 2.49mm Thick walls   mm    mm    mm     Left Small Saphenous Vein  Segment Diameter Comment  1. Origin 3.67mm Thick walls  2. High Calf 3.55mm branch  3. Low Calf 3.33mm   4. Ankle 2.58mm    mm    mm    mm       Landry Mellow, RDMS, RVT  08/02/2015, 11:14 AM

## 2015-08-02 NOTE — Progress Notes (Signed)
PROGRESS NOTE    Cory Weber  [id number]  DOB: [birth year]  DOA: 07/23/2015 PCP: Maggie Font, MD Outpatient Specialists:  Hospital course: Cory Weber is a 66 y.o. male with past medical history significant for diabetes mellitus type 2, hypertension, hyperlipidemia and tobacco abuse who was recently admitted for left foot cellulitis after patient had immeresed his foot in hot water. But due to his neuropathy he did not realize how hot the water was. At that time patient had blister on his right great toe which gone down but developed wound around the great toe. Patient was admitted and placed on antibiotics and vascular surgeon was consulted and ABI was 0.4. Patient was referred to Vascular surgeon as outpatient with Dr. Bridgett Larsson. Patient states his pain worsened last 2-3 days after discharge with new blisters forming on the dorsal aspect of his left foot around the lateral toes. There is also gangrenous appearing left toe. Patient is mildly tachycardic and has leukocytosis. On-call vascular surgeon Dr. Trula Slade at [name] was consulted by the ER physician. Patient transferred to [facility] for further management of his cellulitis and peripheral vascular disease.  Patient also has anemia and during last admission was noted to have stool for occult blood positive. Patient was taking NSAIDs and was instructed to stop and was advised further GI workup as outpatient.  Assessment & Plan:   - Cellulitis of the left foot with vascular disease - Resolved significantly. Complete course of antibiotics.  - Peripheral artery disease - s/p angiogram: shows popliteal occlusion--- Considering femoral - posterior tibial bypass  Per Dr. Trula Slade: Further management as per Surgery team. Patient will need cardiac clearance prior to procedure.W   Normocytic normochromic anemia - recent admission patient had stool for occult blood positive. Patient was instructed to stop taking NSAIDs. Closely follow CBC.    -s/p 1 unit PRBC  - Pepcid.   Diabetes mellitus type 2 with peripheral vascular disease - hold metformin and sulfonylurea while inpatient. Patient is on sliding scale coverage. Closely follow CBGs.  Hypertension - Optimize.  Hyperlipidemia on statins.   Tobacco abuse - tobacco cessation counseling requested.  History of hepatitis C with possible cirrhosis and has undergone complete treatment for hepatitis C.  Chronic kidney disease stage III  Encephalopathy - Seems to have resolved significantly.  DVT prophylaxis: heparin Code Status: Full code.  Family Communication: discussed with daughter on phone- lives in [place] [phone]  Disposition Plan: Home.  Consults called: vascular surgery  Admission status: Inpatient.   Consultants:  Vascular surgery  Procedures:  Antimicrobials: Anti-infectives    Start     Dose/Rate Route Frequency Ordered Stop   07/27/15 1300  cefTRIAXone (ROCEPHIN) 1 g in dextrose 5 % 50 mL IVPB     1 g 100 mL/hr over 30 Minutes Intravenous Every 24 hours 07/27/15 1206     07/26/15 1100  cefUROXime (ZINACEF) 1.5 g in dextrose 5 % 50 mL IVPB  Status:  Discontinued     1.5 g 100 mL/hr over 30 Minutes Intravenous To ShortStay Surgical 07/25/15 1153 07/27/15 1206   07/25/15 1230  vancomycin (VANCOCIN) 1,500 mg in sodium chloride 0.9 % 500 mL IVPB  Status:  Discontinued     1,500 mg 250 mL/hr over 120 Minutes Intravenous Every 24 hours 07/25/15 1152 07/27/15 1206   07/25/15 0600  cefUROXime (ZINACEF) 1.5 g in dextrose 5 % 50 mL IVPB  Status:  Discontinued     1.5 g 100 mL/hr over 30  Minutes Intravenous To ShortStay Surgical 07/24/15 1322 07/25/15 1153   07/24/15 1800  vancomycin (VANCOCIN) 1,500 mg in sodium chloride 0.9 % 500 mL IVPB  Status:  Discontinued     1,500 mg 250 mL/hr over 120 Minutes Intravenous Every 24 hours 07/23/15 2104 07/25/15 1152   07/24/15 0030  piperacillin-tazobactam (ZOSYN) IVPB 3.375 g  Status:   Discontinued     3.375 g 12.5 mL/hr over 240 Minutes Intravenous Every 8 hours 07/23/15 2104 07/27/15 1206   07/23/15 2115  vancomycin (VANCOCIN) IVPB 1000 mg/200 mL premix     1,000 mg 200 mL/hr over 60 Minutes Intravenous NOW 07/23/15 2104 07/23/15 2342   07/23/15 1545  piperacillin-tazobactam (ZOSYN) IVPB 3.375 g     3.375 g 12.5 mL/hr over 240 Minutes Intravenous  Once 07/23/15 1544 07/23/15 1709   07/23/15 1545  vancomycin (VANCOCIN) IVPB 1000 mg/200 mL premix     1,000 mg 200 mL/hr over 60 Minutes Intravenous  Once 07/23/15 1544 07/23/15 1812     Subjective: No complaints  Objective: Filed Vitals:   08/01/15 1530 08/01/15 2045 08/02/15 0526 08/02/15 0959  BP: 150/87 154/81 154/84 143/83  Pulse: 92 91 79 95  Temp:  98.6 F (37 C) 98.9 F (37.2 C)   TempSrc:  Oral Oral   Resp:  20 20 18   Height:      Weight:      SpO2:  100% 96% 95%    Intake/Output Summary (Last 24 hours) at 08/02/15 1120 Last data filed at 08/02/15 0751  Gross per 24 hour  Intake    240 ml  Output   1975 ml  Net  -1735 ml   Filed Weights   07/23/15 1453  Weight: 90.266 kg (199 lb)    Exam: Respiratory: No rhonchi or palpitations. Cardiovascular: normal S1-S2 heard. Abdomen: Soft nontender bowel sounds present. Musculoskeletal: toes wrapped Neurologic: pleasant/cooperative   Data Reviewed: Basic Metabolic Panel:  Recent Labs Lab 07/29/15 0440 07/30/15 0411 07/31/15 0412 08/01/15 0307 08/02/15 0330  NA 136 136 135 136 137  K 3.8 4.1 4.1 4.1 4.3  CL 101 100* 101 102 102  CO2 25 27 26 25 29   GLUCOSE 178* 152* 160* 125* 162*  BUN 16 14 15 13 11   CREATININE 1.79* 1.80* 1.69* 1.72* 1.67*  CALCIUM 8.6* 8.6* 8.6* 8.5* 8.8*  PHOS 4.1 3.9 3.7 4.1 4.2   Liver Function Tests:  Recent Labs Lab 07/29/15 0440 07/30/15 0411 07/31/15 0412 08/01/15 0307 08/02/15 0330  ALBUMIN 2.0* 2.0* 2.1* 2.0* 2.0*   No results for input(s): LIPASE, AMYLASE in the last 168 hours.  Recent  Labs Lab 07/29/15 1110  AMMONIA 14   CBC:  Recent Labs Lab 07/28/15 0433 07/29/15 0440 07/30/15 1348 08/01/15 0307 08/02/15 0345  WBC 14.0* 11.4* 10.8* 11.2* 11.7*  HGB 6.8* 7.2* 7.7* 7.3* 8.0*  HCT 21.9* 23.5* 24.8* 23.7* 25.8*  MCV 86.9 86.1 86.4 87.5 86.6  PLT 442* 495* 544* 573* 644*   Cardiac Enzymes: No results for input(s): CKTOTAL, CKMB, CKMBINDEX, TROPONINI in the last 168 hours. BNP (last 3 results) No results for input(s): PROBNP in the last 8760 hours. CBG:  Recent Labs Lab 08/01/15 0807 08/01/15 1240 08/01/15 1725 08/01/15 2056 08/02/15 0539  GLUCAP 146* 146* 254* 198* 170*    Recent Results (from the past 240 hour(s))  Blood culture (routine x 2)     Status: None   Collection Time: 07/23/15  3:51 PM  Result Value Ref Range Status   Specimen  Description BLOOD LEFT ANTECUBITAL  Final   Special Requests BOTTLES DRAWN AEROBIC AND ANAEROBIC 4CC EACH  Final   Culture NO GROWTH 6 DAYS  Final   Report Status 07/29/2015 FINAL  Final  Blood culture (routine x 2)     Status: None   Collection Time: 07/23/15  4:30 PM  Result Value Ref Range Status   Specimen Description BLOOD SITE NOT SPECIFIED DRAWN BY RN  Final   Special Requests BOTTLES DRAWN AEROBIC AND ANAEROBIC 8CC EACH  Final   Culture NO GROWTH 6 DAYS  Final   Report Status 07/29/2015 FINAL  Final  Surgical pcr screen     Status: Abnormal   Collection Time: 07/31/15  9:51 PM  Result Value Ref Range Status   MRSA, PCR NEGATIVE NEGATIVE Final   Staphylococcus aureus POSITIVE (A) NEGATIVE Final    Comment:        The Xpert SA Assay (FDA approved for NASAL specimens in patients over 53 years of age), is one component of a comprehensive surveillance program.  Test performance has been validated by [facility] for patients greater than or equal to 61 year old. It is not intended to diagnose infection nor to guide or monitor treatment.     Studies: No results found. Scheduled Meds: . sodium  chloride   Intravenous Once  . cefTRIAXone (ROCEPHIN)  IV  1 g Intravenous Q24H  . Chlorhexidine Gluconate Cloth  6 each Topical Daily  . docusate sodium  100 mg Oral Daily  . famotidine  20 mg Oral Daily  . folic acid  1 mg Oral Daily  . gabapentin  300 mg Oral QHS  . heparin subcutaneous  5,000 Units Subcutaneous Q8H  . insulin aspart  0-9 Units Subcutaneous TID WC  . multivitamin with minerals  1 tablet Oral Daily  . mupirocin ointment  1 application Nasal BID  . polyethylene glycol  17 g Oral Daily  . silver sulfADIAZINE   Topical Daily  . simvastatin  20 mg Oral q1800  . tamsulosin  0.4 mg Oral Daily  . thiamine  100 mg Oral Daily   Or  . thiamine  100 mg Intravenous Daily   Continuous Infusions: . sodium chloride Stopped (08/01/15 1827)   Principal Problem:   Cellulitis L foot Active Problems:   Peripheral vascular disease, unspecified ([place])   Essential hypertension   Left foot infection   Type 2 diabetes mellitus with vascular disease (HCC)   Normocytic normochromic anemia   Cellulitis of left foot  Time spent: 25 min  [place], DO [facility] Pager [phone]  If 7PM-7AM, please contact night-coverage [email or web address] Password [email or web address] 08/02/2015, 11:20 AM    LOS: 10 days

## 2015-08-02 NOTE — Progress Notes (Addendum)
  Progress Note    08/02/2015 8:15 AM 1 Day Post-Op  Subjective:  Says he's a little tender in the groin  Afebrile HR  70's-100's NSR/ST Q000111Q systolic Q000111Q  Filed Vitals:   08/01/15 2045 08/02/15 0526  BP: 154/81 154/84  Pulse: 91 79  Temp: 98.6 F (37 C) 98.9 F (37.2 C)  Resp: 20 20    Physical Exam: Incisions:  Right groin is soft without hematoma.   CBC    Component Value Date/Time   WBC 11.7* 08/02/2015 0345   RBC 2.98* 08/02/2015 0345   RBC 4.06* 11/24/2013 0335   HGB 8.0* 08/02/2015 0345   HCT 25.8* 08/02/2015 0345   HCT 32 10/31/2011 1319   PLT 644* 08/02/2015 0345   MCV 86.6 08/02/2015 0345   MCV 82.8 10/31/2011 1319   MCH 26.8 08/02/2015 0345   MCHC 31.0 08/02/2015 0345   RDW 14.3 08/02/2015 0345   LYMPHSABS 1.8 07/24/2015 0313   MONOABS 0.9 07/24/2015 0313   EOSABS 0.1 07/24/2015 0313   BASOSABS 0.0 07/24/2015 0313    BMET    Component Value Date/Time   NA 137 08/02/2015 0330   NA 140 10/31/2011 1319   K 4.3 08/02/2015 0330   K 4.0 10/31/2011 1319   CL 102 08/02/2015 0330   CO2 29 08/02/2015 0330   GLUCOSE 162* 08/02/2015 0330   BUN 11 08/02/2015 0330   BUN 10 10/31/2011 1319   CREATININE 1.67* 08/02/2015 0330   CREATININE 0.99 10/31/2011 1319   CALCIUM 8.8* 08/02/2015 0330   CALCIUM 9.0 10/31/2011 1319   GFRNONAA 42* 08/02/2015 0330   GFRAA 49* 08/02/2015 0330    INR    Component Value Date/Time   INR 1.33 07/25/2015 0519     Intake/Output Summary (Last 24 hours) at 08/02/15 0815 Last data filed at 08/02/15 0751  Gross per 24 hour  Intake      0 ml  Output   1975 ml  Net  -1975 ml     Assessment:  65 y.o. male is s/p:  1. U/s guided access right femoral artery 2. Abdominal aortogram with CO2 3. 2nd order catheterization 4. Left leg runoff 5. Conscious sedation x 20 minutes  1 Day Post-Op  Plan: -pt doing well this morning -right groin is soft  without hematoma -future plan per Dr. Zada Girt, PA-C [facility] [phone] 08/02/2015 8:15 AM

## 2015-08-02 NOTE — Progress Notes (Signed)
Renal function stable.  We will sign off.  Please re-consult prn. Kincaid Tiger C

## 2015-08-02 NOTE — Evaluation (Signed)
Physical Therapy Evaluation Patient Details Name: Cory Weber MRN: [id number] DOB: 03-23-1950 Today's Date: 08/02/2015   History of Present Illness  Patient is a 65 y/o male admitted with L foot infection following injury (dropping frozen chicken on his foot,) and immersing in hot water with burns.  Now s/p arteriogram.  PMH positive for HTN, HLD, DM, tobacco abuse and Hep C.  Clinical Impression  Patient presents with decreased independence with mobility due to deficits listed in PT problem list.  He will benefit from skilled PT in the acute setting to allow return home following SNF level rehab stay.     Follow Up Recommendations SNF    Equipment Recommendations  None recommended by PT    Recommendations for Other Services       Precautions / Restrictions Precautions Precautions: Fall      Mobility  Bed Mobility Overal bed mobility: Needs Assistance Bed Mobility: Sit to Supine       Sit to supine: Supervision   General bed mobility comments: cues for positioning  Transfers Overall transfer level: Needs assistance Equipment used: Rolling walker (2 wheeled) Transfers: Sit to/from Stand Sit to Stand: Mod assist         General transfer comment: increased time and assist coming up from low chair  Ambulation/Gait Ambulation/Gait assistance: Min assist Ambulation Distance (Feet): 25 Feet Assistive device: Rolling walker (2 wheeled) Gait Pattern/deviations: Step-to pattern;Antalgic;Decreased stance time - left;Decreased step length - right;Trunk flexed     General Gait Details: ambulated out of door, then turned around and returned to bed, assist for balance, safety with antalgia  Stairs            Wheelchair Mobility    Modified Rankin (Stroke Patients Only)       Balance Overall balance assessment: Needs assistance   Sitting balance-Leahy Scale: Good     Standing balance support: Bilateral upper extremity supported Standing balance-Leahy  Scale: Poor Standing balance comment: UE support due to painful L LE                             Pertinent Vitals/Pain Pain Assessment: 0-10 Pain Score: 8  Pain Location: L foot with weight bearing Pain Descriptors / Indicators: Aching Pain Intervention(s): Monitored during session;Repositioned    Home Living Family/patient expects to be discharged to:: Skilled nursing facility Living Arrangements: Alone Available Help at Discharge: Personal care attendant Type of Home: Apartment Home Access: Level entry     Home Layout: One level Home Equipment: Walker - 2 wheels;Bedside commode;Cane - single point;Tub bench Additional Comments: aide 3 h/day, 7 d/wk, helps with meals, groceries, cleaning    Prior Function Level of Independence: Independent with assistive device(s)               Hand Dominance   Dominant Hand: Right    Extremity/Trunk Assessment   Upper Extremity Assessment: Overall WFL for tasks assessed           Lower Extremity Assessment: LLE deficits/detail   LLE Deficits / Details: noted weak dorsiflexion with ambulation     Communication   Communication: No difficulties  Cognition Arousal/Alertness: Awake/alert Behavior During Therapy: WFL for tasks assessed/performed Overall Cognitive Status: Within Functional Limits for tasks assessed                      General Comments      Exercises        Assessment/Plan  PT Assessment Patient needs continued PT services  PT Diagnosis Difficulty walking;Acute pain   PT Problem List Decreased activity tolerance;Decreased mobility;Decreased balance;Pain;Decreased safety awareness;Decreased knowledge of use of DME  PT Treatment Interventions DME instruction;Gait training;Functional mobility training;Patient/family education;Therapeutic activities;Therapeutic exercise;Balance training   PT Goals (Current goals can be found in the Care Plan section) Acute Rehab PT Goals Patient  Stated Goal: To go to rehab prior to return home PT Goal Formulation: With patient Time For Goal Achievement: 08/09/15 Potential to Achieve Goals: Good    Frequency Min 3X/week   Barriers to discharge        Co-evaluation               End of Session Equipment Utilized During Treatment: Gait belt Activity Tolerance: Patient limited by pain Patient left: in bed;with call bell/phone within reach           Time: 0950-1017 PT Time Calculation (min) (ACUTE ONLY): 27 min   Charges:   PT Evaluation $PT Eval Moderate Complexity: 1 Procedure PT Treatments $Gait Training: 8-22 mins   PT G CodesReginia Naas 08-27-15, 12:44 PM  Magda Kiel, [place] 08-27-15

## 2015-08-03 ENCOUNTER — Inpatient Hospital Stay (HOSPITAL_COMMUNITY): Payer: Medicaid Other

## 2015-08-03 ENCOUNTER — Encounter (HOSPITAL_COMMUNITY): Payer: Self-pay | Admitting: Student

## 2015-08-03 ENCOUNTER — Other Ambulatory Visit (HOSPITAL_COMMUNITY): Payer: Medicaid Other

## 2015-08-03 DIAGNOSIS — D649 Anemia, unspecified: Secondary | ICD-10-CM

## 2015-08-03 DIAGNOSIS — I10 Essential (primary) hypertension: Secondary | ICD-10-CM

## 2015-08-03 DIAGNOSIS — Z01810 Encounter for preprocedural cardiovascular examination: Secondary | ICD-10-CM

## 2015-08-03 DIAGNOSIS — L03032 Cellulitis of left toe: Secondary | ICD-10-CM

## 2015-08-03 DIAGNOSIS — I739 Peripheral vascular disease, unspecified: Secondary | ICD-10-CM

## 2015-08-03 LAB — ECHOCARDIOGRAM COMPLETE
E decel time: 225 ms
E/e' ratio: 13.45
FS: 2 % — AB (ref 28–44)
Height: 73 in
IVS/LV PW RATIO, ED: 1.13
LA ID, A-P, ES: 29 mm
LA diam end sys: 29 mm
LA diam index: 1.34 cm/m2
LA vol A4C: 35 mL
LV E/e' medial: 13.45
LV E/e'average: 13.45
LV PW d: 11.8 mm — AB (ref 0.6–1.1)
LV e' LATERAL: 7.51 cm/s
LVOT area: 3.8 cm2
LVOT diameter: 22 mm
MV Dec: 225
MV Peak grad: 4 mmHg
MV pk A vel: 74.3 m/s
MV pk E vel: 101 m/s
TAPSE: 19.2 mm
TDI e' lateral: 7.51
TDI e' medial: 5.11
Weight: 3184 [oz_av]

## 2015-08-03 LAB — RENAL FUNCTION PANEL
Albumin: 2 g/dL — ABNORMAL LOW (ref 3.5–5.0)
Anion gap: 6 (ref 5–15)
BUN: 12 mg/dL (ref 6–20)
CO2: 29 mmol/L (ref 22–32)
Calcium: 8.6 mg/dL — ABNORMAL LOW (ref 8.9–10.3)
Chloride: 101 mmol/L (ref 101–111)
Creatinine, Ser: 1.66 mg/dL — ABNORMAL HIGH (ref 0.61–1.24)
GFR calc Af Amer: 49 mL/min — ABNORMAL LOW (ref >60)
GFR calc non Af Amer: 43 mL/min — ABNORMAL LOW (ref >60)
Glucose, Bld: 170 mg/dL — ABNORMAL HIGH (ref 65–99)
Phosphorus: 4 mg/dL (ref 2.5–4.6)
Potassium: 3.9 mmol/L (ref 3.5–5.1)
Sodium: 136 mmol/L (ref 135–145)

## 2015-08-03 LAB — GLUCOSE, CAPILLARY
GLUCOSE-CAPILLARY: 162 mg/dL — AB (ref 65–99)
GLUCOSE-CAPILLARY: 130 mg/dL — AB (ref 65–99)
Glucose-Capillary: 168 mg/dL — ABNORMAL HIGH (ref 65–99)
Glucose-Capillary: 182 mg/dL — ABNORMAL HIGH (ref 65–99)

## 2015-08-03 MED ORDER — INSULIN GLARGINE 100 UNIT/ML ~~LOC~~ SOLN
10.0000 [IU] | Freq: Every day | SUBCUTANEOUS | Status: DC
  Administered 2015-08-03 – 2015-08-10 (×7): 10 [IU] via SUBCUTANEOUS
  Filled 2015-08-03 (×8): qty 0.1

## 2015-08-03 NOTE — Progress Notes (Signed)
Progress Note    Cory Weber  [id number] DOB: 10/24/1950  DOA: 07/23/2015 PCP: Maggie Font, MD    Brief Narrative:   Cory Weber is an 65 y.o. male with past medical history significant for diabetes mellitus type 2, hypertension, hyperlipidemia and tobacco abuse who was recently admitted for left foot cellulitis after patient had immersed his foot in hot water (but due to his neuropathy he did not realize how hot the water was). At that time patient had blister on his right great toe which gone down but developed wound around the great toe. Patient was admitted and placed on antibiotics and vascular surgeon was consulted and ABI was 0.4. Patient was referred to Vascular surgeon as outpatient with Dr. Bridgett Larsson. Patient states his pain worsened last 2-3 days after discharge with new blisters forming on the dorsal aspect of his left foot around the lateral toes. There is also gangrenous appearing left toe. Patient is mildly tachycardic and has leukocytosis. On-call vascular surgeon Dr. Trula Slade at [name] was consulted by the ER physician. Patient transferred to [facility] for further management of his cellulitis and peripheral vascular disease. Patient also has anemia and during last admission was noted to have stool for occult blood positive. Patient was taking NSAIDs and was instructed to stop and was advised further GI workup as outpatient.  Assessment/Plan:   Principal Problem:   Cellulitis L foot Complicated by peripheral vascular disease Initially placed on vancomycin and Zosyn, antibiotics subsequently changed to Rocephin. Underwent angiogram which showed popliteal occlusion. Vascular surgery following with consideration for femoral-posterior tibial bypass. Underwent carotid Dopplers and vein mapping 08/02/15 in anticipation of surgery. No significant ICA stenosis noted. Cardiology consulted for cardiac clearance preoperatively. 2-D Echo showed EF 45-50% with no regional wall  motion abnormalities, with prior Echo done 06/25/13 showing EF of 50-55%.  No dramatic change. Considered low risk.   Active Problems:   Essential hypertension Lasix and HCTZ on hold.    Type 2 diabetes mellitus with vascular disease (HCC) Oral hypoglycemics on hold. Currently being managed with insulin sensitive SSI. CBGs [id number]. Add 10 units of Lantus daily.    Normocytic normochromic anemia Was Hemoccult positive on recent admission. Patient was instructed to stop taking NSAIDs. Continue Pepcid. Received 1 unit of PRBCs.    Hyperlipidemia Continue statin.    Tobacco abuse Tobacco cessation counseling requested.    History of hepatitis C with possible cirrhosis Status post antiviral treatment for hepatitis C.    Stage III chronic kidney disease/urinary retention Patient was started on Flomax and now has a Foley in place. Status post renal ultrasound showed medical renal disease. Status post nephrology evaluation.    Acute encephalopathy Pain medications discontinued and dose of Neurontin decreased with improvement in mental status.  Family Communication/Anticipated D/C date and plan/Code Status   DVT prophylaxis: Heparin ordered. Code Status: Full Code.  Family Communication: [name] [phone] (daughter) is emergency contact, not at the bedside today. Disposition Plan: Likely home when surgery completed and patient stable.   Medical Consultants:    Cardiology  Vascular Surgery  Nephrology   Procedures:   2 D Echo 08/03/15 Study Conclusions  - Left ventricle: Septal and apical hypokinesis. The cavity size  was mildly dilated. Wall thickness was increased in a pattern of  moderate LVH. Systolic function was mildly reduced. The estimated  ejection fraction was in the range of 45% to 50%. Wall motion was  normal; there were no regional wall  motion abnormalities. Left  ventricular diastolic function parameters were normal. - Aortic valve:  Severely calcified non coronary cusp. - Mitral valve: Calcified annulus. Mildly thickened leaflets . - Atrial septum: No defect or patent foramen ovale was identified.  Carotid Dopplers 08/02/15 Summary:  - The vertebral arteries appear patent with antegrade flow. - Findings consistent with 1-39 percent stenosis involving the  right internal carotid artery and the left internal carotid  artery.  Vascular US Lower Extremity Saphenous Vein Mapping 08/02/15    Anti-Infectives:   Vancomycin 07/23/15---> 07/27/15 Cefuroxime 07/23/15 --->07/27/15 Rocephin 07/27/15--->  Subjective:    Cory Weber says he is having 8/10 foot pain, Percocet helps.  Appetite OK.  Last BM was 2 days ago.  No dyspnea.  Objective:    Filed Vitals:   08/02/15 2049 08/03/15 0604 08/03/15 0620 08/03/15 0630  BP: 155/95 177/88 152/90 123/57  Pulse: 98 83    Temp: 98.5 F (36.9 C) 99.2 F (37.3 C)    TempSrc: Oral Oral    Resp: 16 16    Height:      Weight:      SpO2: 100% 99%      Intake/Output Summary (Last 24 hours) at 08/03/15 0749 Last data filed at 08/03/15 0725  Gross per 24 hour  Intake    480 ml  Output   1610 ml  Net  -1130 ml   Filed Weights   07/23/15 1453  Weight: 90.266 kg (199 lb)    Exam: General exam: Appears calm and comfortable. Sitting up in chair. Respiratory system: Clear to auscultation. Respiratory effort normal. Cardiovascular system: S1 & S2 heard, RRR. No JVD,  rubs, gallops or clicks. No murmurs. Gastrointestinal system: Abdomen is nondistended, soft and nontender. No organomegaly or masses felt. Normal bowel sounds heard. Central nervous system: Alert and oriented. No focal neurological deficits. Extremities: Left foot as pictured below. Skin: No rashes, lesions or ulcers except as noted on left foot below. Psychiatry: Judgement and insight appear normal. Mood & affect appropriate.      Data Reviewed:   I have personally reviewed following labs and  imaging studies:  Labs: Basic Metabolic Panel:  Recent Labs Lab 07/30/15 0411 07/31/15 0412 08/01/15 0307 08/02/15 0330 08/03/15 0250  NA 136 135 136 137 136  K 4.1 4.1 4.1 4.3 3.9  CL 100* 101 102 102 101  CO2 27 26 25 29 29   GLUCOSE 152* 160* 125* 162* 170*  BUN 14 15 13 11 12   CREATININE 1.80* 1.69* 1.72* 1.67* 1.66*  CALCIUM 8.6* 8.6* 8.5* 8.8* 8.6*  PHOS 3.9 3.7 4.1 4.2 4.0   GFR Estimated Creatinine Clearance: 52.1 mL/min (by C-G formula based on Cr of 1.66). Liver Function Tests:  Recent Labs Lab 07/30/15 0411 07/31/15 0412 08/01/15 0307 08/02/15 0330 08/03/15 0250  ALBUMIN 2.0* 2.1* 2.0* 2.0* 2.0*   No results for input(s): LIPASE, AMYLASE in the last 168 hours.  Recent Labs Lab 07/29/15 1110  AMMONIA 14   Coagulation profile No results for input(s): INR, PROTIME in the last 168 hours.  CBC:  Recent Labs Lab 07/28/15 0433 07/29/15 0440 07/30/15 1348 08/01/15 0307 08/02/15 0345  WBC 14.0* 11.4* 10.8* 11.2* 11.7*  HGB 6.8* 7.2* 7.7* 7.3* 8.0*  HCT 21.9* 23.5* 24.8* 23.7* 25.8*  MCV 86.9 86.1 86.4 87.5 86.6  PLT 442* 495* 544* 573* 644*   Cardiac Enzymes: No results for input(s): CKTOTAL, CKMB, CKMBINDEX, TROPONINI in the last 168 hours. BNP (last 3 results) No results for  input(s): PROBNP in the last 8760 hours. CBG:  Recent Labs Lab 08/02/15 0539 08/02/15 1131 08/02/15 1608 08/02/15 2113 08/03/15 0551  GLUCAP 170* 208* 162* 181* 168*   D-Dimer: No results for input(s): DDIMER in the last 72 hours. Hgb A1c: No results for input(s): HGBA1C in the last 72 hours. Lipid Profile: No results for input(s): CHOL, HDL, LDLCALC, TRIG, CHOLHDL, LDLDIRECT in the last 72 hours. Thyroid function studies: No results for input(s): TSH, T4TOTAL, T3FREE, THYROIDAB in the last 72 hours.  Invalid input(s): FREET3 Anemia work up: No results for input(s): VITAMINB12, FOLATE, FERRITIN, TIBC, IRON, RETICCTPCT in the last 72 hours. Sepsis  Labs:  Recent Labs Lab 07/29/15 0440 07/30/15 1348 08/01/15 0307 08/02/15 0345  WBC 11.4* 10.8* 11.2* 11.7*   Urine analysis:    Component Value Date/Time   COLORURINE YELLOW 07/28/2015 0550   APPEARANCEUR TURBID* 07/28/2015 0550   LABSPEC 1.015 07/28/2015 0550   PHURINE 5.5 07/28/2015 0550   GLUCOSEU NEGATIVE 07/28/2015 0550   HGBUR SMALL* 07/28/2015 0550   BILIRUBINUR NEGATIVE 07/28/2015 0550   [place] 07/28/2015 0550   PROTEINUR NEGATIVE 07/28/2015 0550   UROBILINOGEN 1.0 11/29/2013 1705   NITRITE NEGATIVE 07/28/2015 0550   LEUKOCYTESUR MODERATE* 07/28/2015 0550   Microbiology Recent Results (from the past 240 hour(s))  Surgical pcr screen     Status: Abnormal   Collection Time: 07/31/15  9:51 PM  Result Value Ref Range Status   MRSA, PCR NEGATIVE NEGATIVE Final   Staphylococcus aureus POSITIVE (A) NEGATIVE Final    Comment:        The Xpert SA Assay (FDA approved for NASAL specimens in patients over 3 years of age), is one component of a comprehensive surveillance program.  Test performance has been validated by [facility] for patients greater than or equal to 18 year old. It is not intended to diagnose infection nor to guide or monitor treatment.     Radiology: No results found.  Medications:   . sodium chloride   Intravenous Once  . cefTRIAXone (ROCEPHIN)  IV  1 g Intravenous Q24H  . Chlorhexidine Gluconate Cloth  6 each Topical Daily  . docusate sodium  100 mg Oral Daily  . famotidine  20 mg Oral Daily  . folic acid  1 mg Oral Daily  . gabapentin  300 mg Oral QHS  . heparin subcutaneous  5,000 Units Subcutaneous Q8H  . insulin aspart  0-9 Units Subcutaneous TID WC  . multivitamin with minerals  1 tablet Oral Daily  . mupirocin ointment  1 application Nasal BID  . polyethylene glycol  17 g Oral Daily  . silver sulfADIAZINE   Topical Daily  . simvastatin  20 mg Oral q1800  . tamsulosin  0.4 mg Oral Daily  . thiamine  100 mg Oral  Daily   Or  . thiamine  100 mg Intravenous Daily   Continuous Infusions: . sodium chloride Stopped (08/01/15 1827)    Time spent: 25 minutes.   LOS: 11 days   [place] [facility] Pager [phone]. If unable to reach me by pager, please call my cell phone at [phone].  *Please refer to [email or web address], password [email or web address] to get updated schedule on who will round on this patient, as hospitalists switch teams weekly. If 7PM-7AM, please contact night-coverage at [email or web address], password [email or web address] for any overnight needs.  08/03/2015, 7:49 AM

## 2015-08-03 NOTE — NC FL2 (Signed)
[place] LEVEL OF CARE SCREENING TOOL     IDENTIFICATION  Patient Name: Cory Weber Birthdate: [birth year] Sex: male Admission Date (Current Location): 07/23/2015  [facility] and [place] Number:  Herbalist and Address:  [facility] [place], [place] [address] [place], [place] [address]      Provider Number: [id number]  Attending Physician Name and Address:  Venetia Maxon Rama, MD  Relative Name and Phone Number:       Current Level of Care: Hospital Recommended Level of Care: [place] Prior Approval Number:    Date Approved/Denied:   PASRR Number: [id number]  Discharge Plan: SNF    Current Diagnoses: Patient Active Problem List   Diagnosis Date Noted  . Left foot infection 07/23/2015  . Type 2 diabetes mellitus with vascular disease ([place]) 07/23/2015  . Normocytic normochromic anemia 07/23/2015  . Cellulitis of left foot 07/23/2015  . Cellulitis L foot 07/18/2015  . Diabetes mellitus with foot ulcer, without long-term current use of insulin ([place]) 07/18/2015  . Chronic kidney disease, stage 3 07/18/2015  . Essential hypertension 07/18/2015  . Cellulitis of foot 07/18/2015  . Dyspepsia 04/13/2014  . Acute blood loss anemia 12/01/2013  . Constipation 12/01/2013  . Radiculopathy of lumbar region 11/29/2013  . Acute right lumbar radiculopathy 11/26/2013  . S/P lumbar spinal fusion 11/25/2013  . Lumbar spinal stenosis 11/24/2013  . Right leg weakness 11/23/2013  . PUD (peptic ulcer disease) 11/19/2012  . IDA (iron deficiency anemia) 07/10/2012  . Chronic hepatitis C ([place]) 05/13/2012  . Unspecified constipation 05/13/2012  . Peripheral vascular disease, unspecified ([place]) 01/21/2012  . Hepatitis C antibody test positive 12/03/2011  . Normocytic anemia 12/03/2011  . Melena 12/03/2011  . DM (diabetes mellitus), type 2, uncontrolled ([place]) 02/03/2011  . Accelerated hypertension 02/03/2011  . Hemiparesthesia  02/03/2011  . Polysubstance abuse 02/03/2011  . Tobacco abuse 02/03/2011    Orientation RESPIRATION BLADDER Height & Weight     Self, Time, Situation, Place  Normal Continent Weight: 199 lb (90.266 kg) Height:  6\' 1"  (185.4 cm)  BEHAVIORAL SYMPTOMS/MOOD NEUROLOGICAL BOWEL NUTRITION STATUS   (None)  (none) Continent Diet (Heart Health)  AMBULATORY STATUS COMMUNICATION OF NEEDS Skin   Extensive Assist Verbally Surgical wounds (Open Wound: LT Foot Anterior and Posterior 1st to 4th toes)                       Personal Care Assistance Level of Assistance  Bathing, Dressing, Feeding Bathing Assistance: Limited assistance Feeding assistance: Independent Dressing Assistance: Limited assistance     Functional Limitations Info  Sight, Hearing, Speech Sight Info: Adequate Hearing Info: Adequate Speech Info: Adequate    SPECIAL CARE FACTORS FREQUENCY  PT (By licensed PT), OT (By licensed OT)     PT Frequency: 5/ week OT Frequency: 5/ week            Contractures Contractures Info: Present    Additional Factors Info  Code Status, Allergies, Psychotropic, Insulin Sliding Scale (cefTRIAXone (ROCEPHIN) 1 g in dextrose 5 % 50 mL IVPB Dose: 1 g Freq: Every 24 hours Route: IV) Code Status Info: FULL Allergies Info: No Known Allergies Psychotropic Info: gabapentin (NEURONTIN) capsule 300 mg Dose: 300 mg Freq: Daily at bedtime Route: PO Insulin Sliding Scale Info: insulin aspart (novoLOG) injection 0-9 Units Dose: 0-9 Units Freq: 3 times daily with meals Route: [place]       Current Medications (08/03/2015):  This is the current  hospital active medication list Current Facility-Administered Medications  Medication Dose Route Frequency Provider Last Rate Last Dose  . 0.9 %  sodium chloride infusion   Intravenous Once Gardiner Barefoot, NP      . 0.9 %  sodium chloride infusion   Intravenous Continuous Serafina Mitchell, MD   Stopped at 08/01/15 1827  . acetaminophen (TYLENOL) tablet  325-650 mg  325-650 mg Oral Q4H PRN Serafina Mitchell, MD       Or  . acetaminophen (TYLENOL) suppository 325-650 mg  325-650 mg Rectal Q4H PRN Serafina Mitchell, MD      . alum & mag hydroxide-simeth (MAALOX/MYLANTA) 200-200-20 MG/5ML suspension 15-30 mL  15-30 mL Oral Q2H PRN Serafina Mitchell, MD      . cefTRIAXone (ROCEPHIN) 1 g in dextrose 5 % 50 mL IVPB  1 g Intravenous Q24H Geradine Girt, DO   1 g at 08/03/15 1354  . Chlorhexidine Gluconate Cloth 2 % PADS 6 each  6 each Topical Daily Geradine Girt, DO   6 each at 08/02/15 1152  . docusate sodium (COLACE) capsule 100 mg  100 mg Oral Daily Serafina Mitchell, MD   100 mg at 08/03/15 [id number]  . famotidine (PEPCID) tablet 20 mg  20 mg Oral Daily Jaquita Folds, RPH   20 mg at 08/03/15 [id number]  . folic acid (FOLVITE) tablet 1 mg  1 mg Oral Daily Geradine Girt, DO   1 mg at 08/03/15 [id number]  . gabapentin (NEURONTIN) capsule 300 mg  300 mg Oral QHS Geradine Girt, DO   300 mg at 08/02/15 2138  . guaiFENesin-dextromethorphan (ROBITUSSIN DM) 100-10 MG/5ML syrup 15 mL  15 mL Oral Q4H PRN Serafina Mitchell, MD      . heparin injection 5,000 Units  5,000 Units Subcutaneous Q8H Geradine Girt, DO   5,000 Units at 08/03/15 [id number]  . hydrALAZINE (APRESOLINE) injection 10 mg  10 mg Intravenous Q6H PRN Geradine Girt, DO      . insulin aspart (novoLOG) injection 0-9 Units  0-9 Units Subcutaneous TID WC Rise Patience, MD   2 Units at 08/03/15 1306  . insulin glargine (LANTUS) injection 10 Units  10 Units Subcutaneous Daily Venetia Maxon Rama, MD   10 Units at 08/03/15 [phone]  . labetalol (NORMODYNE,TRANDATE) injection 10 mg  10 mg Intravenous Q10 min PRN Serafina Mitchell, MD   10 mg at 08/03/15 0606  . metoprolol (LOPRESSOR) injection 2-5 mg  2-5 mg Intravenous Q2H PRN Serafina Mitchell, MD      . morphine 2 MG/ML injection 2 mg  2 mg Intravenous Q1H PRN Serafina Mitchell, MD   2 mg at 08/02/15 1215  . multivitamin with minerals tablet 1 tablet  1 tablet Oral Daily Geradine Girt,  DO   1 tablet at 08/03/15 [phone]  . mupirocin ointment (BACTROBAN) 2 % 1 application  1 application Nasal BID Geradine Girt, DO   1 application at [phone]  . ondansetron (ZOFRAN) tablet 4 mg  4 mg Oral Q6H PRN Rise Patience, MD       Or  . ondansetron [facility]) injection 4 mg  4 mg Intravenous Q6H PRN Rise Patience, MD   4 mg at 07/23/15 2155  . oxyCODONE-acetaminophen (PERCOCET/ROXICET) 5-325 MG per tablet 2 tablet  2 tablet Oral Q6H PRN Serafina Mitchell, MD   2 tablet at 08/03/15 1346  . phenol (CHLORASEPTIC) mouth spray 1 spray  1 spray Mouth/Throat PRN Serafina Mitchell, MD      . polyethylene glycol (MIRALAX / GLYCOLAX) packet 17 g  17 g Oral Daily Geradine Girt, DO   17 g at 08/02/15 0956  . silver sulfADIAZINE (SILVADENE) 1 % cream   Topical Daily Geradine Girt, DO      . simvastatin (ZOCOR) tablet 20 mg  20 mg Oral q1800 Rise Patience, MD   20 mg at 08/02/15 1729  . tamsulosin (FLOMAX) capsule 0.4 mg  0.4 mg Oral Daily Geradine Girt, DO   0.4 mg at 08/03/15 [id number]  . thiamine (VITAMIN B-1) tablet 100 mg  100 mg Oral Daily Geradine Girt, DO   100 mg at 08/03/15 [id number]     Discharge Medications: Please see discharge summary for a list of discharge medications.  Relevant Imaging Results:  Relevant Lab Results:   Additional Information [id number]  Samule Dry, LCSW

## 2015-08-03 NOTE — Clinical Social Work Note (Signed)
Clinical Social Work Assessment  Patient Details  Name: Cory Weber MRN: [id number] Date of Birth: [birth year]  Date of referral:  08/03/15               Reason for consult:  Discharge Planning                Permission sought to share information with:    Permission granted to share information::  Yes, Release of Information Signed  Name::     Geannie Risen  Agency::  [facility]  Relationship::  Sister  Contact Information:     Housing/Transportation Living arrangements for the past 2 months:  Apartment Source of Information:  Patient Patient Interpreter Needed:  None Criminal Activity/Legal Involvement Pertinent to Current Situation/Hospitalization:  No - Comment as needed Significant Relationships:  Siblings Lives with:  Self Do you feel safe Weber back to the place where you live?  Yes Need for family participation in patient care:  No (Coment)  Care giving concerns:  The patient is aggregable for short term rehab at discharge. Patient reported he would like to go home if possible.   Social Worker assessment / plan:   CSW met with patient at beside to complete assessment. Patient was resting comfortably in bed. CSW explained PT recommendation for SNF placement. CSW explained SNF search and placement process to the patient and answered his questions. CSW explained that due to have Medicaid patient's SNFs options are limited. Patient denied any ability to private pay for SNFs.CSW will follow up with bed offers  Employment status:  Disabled (Comment on whether or not currently receiving Disability) (Patient is receiving disability.) Insurance information:  Medicaid In [place] PT Recommendations:  [place] / Referral to community resources:  [place]  Patient/Family's Response to care:  The patient appears happy with the care she is receiving in hospital and is appreciative of CSW assistance.  Patient/Family's Understanding of and Emotional  Response to Diagnosis, Current Treatment, and Prognosis:  The patient has a good understanding of why he was admitted. He understands the care plan and what he will need post discharge.  Emotional Assessment Appearance:  Appears stated age Attitude/Demeanor/Rapport:   (Pt was appropriate.) Affect (typically observed):  Accepting, Appropriate, Calm Orientation:  Oriented to Self, Oriented to Place, Oriented to  Time, Oriented to Situation Alcohol / Substance use:  Not Applicable Psych involvement (Current and /or in the community):  No (Comment)  Discharge Needs  Concerns to be addressed:  Discharge Planning Concerns Readmission within the last 30 days:  No Current discharge risk:  Physical Impairment Barriers to Discharge:  Continued Medical Work up   [employer], LCSW 08/03/2015, 2:09 PM

## 2015-08-03 NOTE — Progress Notes (Signed)
  Echocardiogram 2D Echocardiogram has been performed.  Cory Weber M 08/03/2015, 3:11 PM

## 2015-08-03 NOTE — Consult Note (Signed)
Cardiology Consult    Patient ID: Cory Weber MRN: [id number], DOB/AGE: 1950-05-04   Admit date: 07/23/2015 Date of Consult: 08/03/2015  Primary Physician: Maggie Font, MD Reason for Consult: Preoperative Clearance  Primary Cardiologist: Dr. Bronson Ing Requesting Provider: Dr. Rockne Menghini   History of Present Illness    Cory Weber is a 65 y.o. male with past medical history of Type 2 DM, HTN, HLD, Hepatitis C, CVA, and PVD who presented to [facility] on 07/23/2015 for left foot pain due to immersing his foot into hot water. He says he dropped a frozen chicken on his foot days prior and had significant bruising. Family recommended he immerse his foot in hot water, but due to his nephropathy, the water was scalding and burnt his foot, causing significant blistering.  He was diagnosed with cellulitis of the left foot and started on Vancomycin and Zosyn. An angiogram was recommended after admission but due to rising creatinine, this was postponed.  Creatinine was 1.5 on admission, but up to 1.9 on 07/27/2015. Nephrology was consulted and thought his AKI was likely multifactorial due to NSAIDS, possible ATN, and possible vancomycin toxicity.   Creatinine had improved to 1.72 on 08/01/2015. Angiography was performed that day and showed an occluded left popliteal artery. He is now being considered for fem-pop bypass and Cardiology is consulted for pre-operative clearance.  The patient was last seen by Dr. Bronson Ing in 10/2013. He had been referred at that time for an arrhythmia and heart murmur. He wore a 1-week event monitor which did not show any significant arrhythmias. An echocardiogram was performed at that time to evaluate his murmur. Echo showed an EF of 50-55% with no wall motion abnormalities, Grade 2 DD, and aortic sclerosis without stenosis.   In talking with him today, he denies any recent chest pain, palpitations, orthopnea, PND, or dyspnea on exertion. He reports not being very  active at baseline but denies any symptoms when carrying out daily activities, such as walking to the mailbox or around the grocery store. He does not climb stairs regularly due to living in a 1-story home.  He denies any prior history of CAD. No known family history of CAD. Says is mother was diagnosed with CHF in her 58's. He smokes less than 0.5 ppd, but has done so for 20+ years. Says he wants to abstain from tobacco now that he has known PVD and is seeing the consequences of cigarette use. Denies any alcohol use over the past 5+ years. Reports using Cocaine in the past (last used 2+ years ago).   Most recent lab work shows a creatinine of 1.66 today. UDS this admission positive for opiates but negative for Cocaine. Hgb A1c 7.0. An EKG was obtained on 07/23/2015 and showed sinus tachycardia, HR 116, with PAC's, TWI in leads III and V1 (present on previous tracings). A repeat echocardiogram is pending.   Past Medical History   Past Medical History  Diagnosis Date  . Diabetes mellitus   . Hypertension   . Hepatitis C     states he was diagnosed in 2007 or 2007 while living in [place], [place]  . CVA (cerebral infarction) 01/2011  . Hyperlipidemia   . Asthma   . PVD (peripheral vascular disease) ([place])   . Anemia   . Stroke ([place])   . Headache(784.0)   . Peripheral neuropathy ([place])   . Cellulitis and abscess of foot 07/2015    Past Surgical History  Procedure Laterality Date  .  Liver biopsy  2005    Done in [place], [place]. Chronic hepatitis with mild periportal inflammation, lobular unicellular necrosis and portal fibrosis. Grade 2, stage 1-2.  Marland Kitchen Colonoscopy with propofol N/A 09/03/2012    [id number] polyp-removed as outlined above. Prominent internal hemorrhoids. Tubular adenoma  . Esophagogastroduodenoscopy (egd) with propofol N/A 09/03/2012    [id number] hernia. Gastric diverticulum. Gastric ulcers with associated erosions. Duodenal erosions. Status post gastric biopsy. H.PYLORI  gastritis   . Biopsy N/A 09/03/2012    Procedure: BIOPSY;  Surgeon: Daneil Dolin, MD;  Location: [facility];  Service: Endoscopy;  Laterality: N/A;  gastric and gastric mucosa  . Polypectomy N/A 09/03/2012    Procedure: POLYPECTOMY;  Surgeon: Daneil Dolin, MD;  Location: [facility];  Service: Endoscopy;  Laterality: N/A;  cecal polyp  . Esophagogastroduodenoscopy (egd) with propofol N/A 12/03/2012    Dr. Gala Romney: gastric diverticulum, gastric erosions and scar. Previously noted gastric ulcer completed healed. Biopsy without H.pylori.   . Biopsy N/A 12/03/2012    Procedure: BIOPSY;  Surgeon: Daneil Dolin, MD;  Location: [facility];  Service: Endoscopy;  Laterality: N/A;  . Maximum access (mas)posterior lumbar interbody fusion (plif) 1 level N/A 11/25/2013    Procedure: FOR MAXIMUM ACCESS (MAS) POSTERIOR LUMBAR INTERBODY FUSION (PLIF) 1 LEVEL;  Surgeon: Eustace Moore, MD;  Location: [place] [facility];  Service: Neurosurgery;  Laterality: N/A;  FOR MAXIMUM ACCESS (MAS) POSTERIOR LUMBAR INTERBODY FUSION (PLIF) 1 LEVEL LUMBAR 3-4  . Peripheral vascular catheterization Left 08/01/2015    Procedure: Lower Extremity Angiography;  Surgeon: Serafina Mitchell, MD;  Location: [place] [facility];  Service: Cardiovascular;  Laterality: Left;  . Peripheral vascular catheterization N/A 08/01/2015    Procedure: Abdominal Aortogram;  Surgeon: Serafina Mitchell, MD;  Location: [place] [facility];  Service: Cardiovascular;  Laterality: N/A;     Allergies  No Known Allergies  Inpatient Medications    . sodium chloride   Intravenous Once  . cefTRIAXone (ROCEPHIN)  IV  1 g Intravenous Q24H  . Chlorhexidine Gluconate Cloth  6 each Topical Daily  . docusate sodium  100 mg Oral Daily  . famotidine  20 mg Oral Daily  . folic acid  1 mg Oral Daily  . gabapentin  300 mg Oral QHS  . heparin subcutaneous  5,000 Units Subcutaneous Q8H  . insulin aspart  0-9 Units Subcutaneous TID WC  . insulin glargine  10 Units Subcutaneous Daily  .  multivitamin with minerals  1 tablet Oral Daily  . mupirocin ointment  1 application Nasal BID  . polyethylene glycol  17 g Oral Daily  . silver sulfADIAZINE   Topical Daily  . simvastatin  20 mg Oral q1800  . tamsulosin  0.4 mg Oral Daily  . thiamine  100 mg Oral Daily    Family History    Family History  Problem Relation Age of Onset  . Breast cancer Mother     deceased  . Cancer Mother   . Diabetes Father   . Hypertension Father   . Heart disease Father     deceased  . Hyperlipidemia Father   . Diabetes Sister   . Hypertension Sister   . Breast cancer Sister   . Colon cancer Neg Hx   . Liver disease Neg Hx   . Heart failure Mother     Social History    Social History   Social History  . Marital Status: Single    Spouse Name: N/A  . Number  of Children: 1  . Years of Education: N/A   Occupational History  . trying to get disability    Social History Main Topics  . Smoking status: Current Some Day Smoker -- 0.50 packs/day for 20 years    Types: Cigarettes  . Smokeless tobacco: Never Used     Comment: given 1-800-Quit Now  . Alcohol Use: No  . Drug Use: No     Comment: last used April 2016.  Marland Kitchen Sexual Activity: Not on file   Other Topics Concern  . Not on file   Social History Narrative     Review of Systems    General:  No chills, fever, night sweats or weight changes. Positive for left foot pain. Cardiovascular:  No chest pain, dyspnea on exertion, edema, orthopnea, palpitations, paroxysmal nocturnal dyspnea. Dermatological: No rash, lesions/masses Respiratory: No cough, dyspnea Urologic: No hematuria, dysuria Abdominal:   No nausea, vomiting, diarrhea, bright red blood per rectum, melena, or hematemesis Neurologic:  No visual changes, wkns, changes in mental status. All other systems reviewed and are otherwise negative except as noted above.  Physical Exam    Blood pressure 123/57, pulse 83, temperature 99.2 F (37.3 C), temperature source  Oral, resp. rate 16, height 6\' 1"  (1.854 m), weight 199 lb (90.266 kg), SpO2 99 %.  General: Pleasant, African American male appearing in NAD. Psych: Normal affect. Neuro: Alert and oriented X 3. Moves all extremities spontaneously. HEENT: Normal  Neck: Supple without bruits or JVD. Lungs:  Resp regular and unlabored, CTA without wheezing or rales. Heart: RRR no s3, s4, or murmurs. Abdomen: Soft, non-tender, non-distended, BS + x 4.  Extremities: No clubbing, cyanosis or edema. DP/PT/Radials 1+ and equal bilaterally. Left toes with drainage present, very malodorous.  Labs    Troponin (Point of Care Test) No results for input(s): TROPIPOC in the last 72 hours. No results for input(s): CKTOTAL, CKMB, TROPONINI in the last 72 hours. Lab Results  Component Value Date   WBC 11.7* 08/02/2015   HGB 8.0* 08/02/2015   HCT 25.8* 08/02/2015   MCV 86.6 08/02/2015   PLT 644* 08/02/2015     Recent Labs Lab 08/03/15 0250  NA 136  K 3.9  CL 101  CO2 29  BUN 12  CREATININE 1.66*  CALCIUM 8.6*  GLUCOSE 170*   Lab Results  Component Value Date   CHOL 127 11/24/2013   HDL 44 11/24/2013   LDLCALC 62 11/24/2013   TRIG 103 11/24/2013   No results found for: [facility] 2   Radiology Studies    US Renal: 07/27/2015  CLINICAL DATA:  Acute kidney injury EXAM: RENAL / URINARY TRACT ULTRASOUND COMPLETE COMPARISON:  None. FINDINGS: Right Kidney: Length: 12.6 cm. Mildly increased echogenicity. No hydronephrosis or mass. Left Kidney: Length: 12.6 cm. Mildly increased echogenicity. No hydronephrosis or mass. Bladder: Not evaluated.  Decompressed by Foley catheter. IMPRESSION: Evidence of medical renal disease Electronically Signed   By: Skipper Cliche M.D.   On: 07/27/2015 11:21   US Arterial Seg Multiple: 07/19/2015  CLINICAL DATA:  65 year old male with left foot cellulitis and pain. EXAM: NONINVASIVE PHYSIOLOGIC VASCULAR STUDY OF BILATERAL LOWER EXTREMITIES TECHNIQUE: Evaluation of both lower extremities  was performed at rest, including calculation of ankle-brachial indices, multiple segmental pressure evaluation, segmental Doppler and segmental pulse volume recording. COMPARISON:  None. FINDINGS: Right ABI:  0.91 Left ABI:  0.45 Right Lower Extremity: Significant pressure differential between the low thigh and calf cuffs suggesting distal femoral popliteal disease. Preserved digital waveforms on  PVRs. Left Lower Extremity: Significant pressure gradient between the low thigh cuff and calf cuff consistent with distal femoral popliteal disease. Additionally, there are significant pressure gradients between the calf and ankle consistent with runoff disease. IMPRESSION: 1. Resting right ankle brachial index of 0.91 consistent with at least mild peripheral arterial disease. Segmental pressures, arterial waveforms and PVRs suggest femoral popliteal disease. 2. Resting left ankle brachial index of 0.45 consistent with advanced peripheral arterial disease. Segmental pressures, arterial waveforms and PVRs suggest both femoral popliteal disease and runoff disease. Given clinical history of left foot wound and rest pain these findings are concerning for critical limb ischemia (CLI). Recommend referral to vascular Interventional Radiology or other endovascular specialist for further evaluation and management. Signed, Criselda Peaches, MD [facility] Radiology Electronically Signed   By: Jacqulynn Cadet M.D.   On: 07/19/2015 16:22   Dg Foot 2 Views Left: 07/24/2015  CLINICAL DATA:  Cellulitis with wound on first toe EXAM: LEFT FOOT - 2 VIEW COMPARISON:  July 18, 2015 FINDINGS: Frontal and lateral views were obtained. There is no demonstrable fracture or dislocation. The joint spaces appear normal. No erosive change or bony destruction. There is soft tissue swelling over the dorsum of the foot. There is no soft tissue air or radiopaque foreign body. IMPRESSION: No bony  destruction or erosion. No fracture or dislocation. Soft tissue swelling over the dorsal foot. No soft tissue abscess or radiopaque foreign body evident. Electronically Signed   By: Lowella Grip III M.D.   On: 07/24/2015 07:23   Dg Foot Complete Left: 07/18/2015  CLINICAL DATA:  Left foot pain with ulcer along the great toe and fourth toe for 2 weeks. Diabetic. EXAM: LEFT FOOT - COMPLETE 3+ VIEW COMPARISON:  06/29/2015 prior exams FINDINGS: There is no evidence of acute fracture, subluxation or dislocation. No radiographic evidence of osteomyelitis identified. No radiopaque foreign bodies are present. Vascular calcifications are again noted. IMPRESSION: No acute bony abnormalities. Electronically Signed   By: Margarette Canada M.D.   On: 07/18/2015 14:21    EKG & Cardiac Imaging    EKG:   Echocardiogram: 06/2013 Study Conclusions: - Left ventricle: The cavity size was normal. Wall thickness was increased in a pattern of mild LVH. Systolic function was normal. The estimated ejection fraction was in the range of 50% to 55%. Wall motion was normal; there were no regional wall motion abnormalities. Features are consistent with a pseudonormal left ventricular filling pattern, with concomitant abnormal relaxation and increased filling pressure (grade 2 diastolic dysfunction). - Aortic valve: Mildly calcified annulus. Probably trileaflet; mildly calcified leaflets. Severe thickening involving the noncoronary cusp. Cusp separation was reduced. Transvalvular velocity was minimally increased. There was no significant stenosis. No significant regurgitation. - Mitral valve: Calcified annulus. Mildly thickened leaflets . Trivial regurgitation. - Right atrium: Central venous pressure: 23mm Hg (est). - Tricuspid valve: Trivial regurgitation. - Pulmonary arteries: Systolic pressure could not be accurately estimated. - Pericardium, extracardiac: There was no  pericardial effusion. Impressions:  - Mild LVH with LVEF approximately [id number], grade 2 diastolic dysfunction. MAC with mildly thickened mitral leaflets and trivial mitral regurgitation. Aortic valve is mildly scleroticwith severely thickened noncoronary cusp and mildly reduced overall cusp excursion - no definitive stenosis. Unable to assess PASP. No pericardial effusion.  Assessment & Plan    1. Preoperative Clearance for Left Femoral-Popliteal Bypass - presented with cellulitis and non-healing wounds of his left foot. Angiography performed on 08/01/2015 showed an occluded left popliteal artery.  He is now being considered for fem-pop bypass and Cardiology is consulted for pre-operative clearance. - last echo in 10/2013 showed a preserved EF of 50-55% with no wall motion abnormalities, Grade 2 DD, and aortic sclerosis without stenosis. Denies any recent anginal symptoms. Ambulates around his home and at the grocery store without difficulty.  - EKG shows sinus tachycardia, HR 116, with PAC's, TWI in leads III and V1 (present on previous tracings). A repeat echocardiogram is pending. - would not require further cardiac workup prior to his procedure unless EF is significantly reduced.  2. HLD - continue statin therapy.  3. HTN - BP has been 123/57 - 177/95 in the past 24 hours. - ACE-I/HCTZ d/c'ed in setting of AKI. Could consider use of a BB or Hydralaizine (would worry about compliance with TID dosing).  4. Acute on Chronic Stage 3 CKD - baseline creatine of 1.3. Elevated to 1.5 on admission. Peaked at 2.03 on 07/28/2015. - Nephrology was following. ACE-I/HCTZ discontinued. - creatinine stable at 1.66 today.   5. Anemia - Hgb 7.3 on 08/01/2015 - per admitting team  Signed, Erma Heritage, PA-C 08/03/2015, 12:27 PM Pager: [phone] Patient seen and examined and history reviewed. Agree with above findings and plan. 65 yo BM seen for pre op cardiac evaluation for  ischemic foot. No prior history of CAD, CHF, stents, or valvular disease. Was pretty active prior to recent foot injury. Denies any chest pain, dyspnea, palpitations, dizziness. Lungs are clear.  Cardiac exam is normal.  Gangrenous toes on left foot.  Ecg is normal. Echo is pending.  Plan: Providing there is not a dramatic change in his Echo findings he is at  low risk from a cardiac standpoint for vascular surgery. No additional pre op testing indicated. Please call with cardiac problems/questions.   Franki Alcaide [place], [place] 08/03/2015 12:40 PM

## 2015-08-03 NOTE — Progress Notes (Signed)
Subjective  -   No complaints   Physical Exam:  CV:RRR Pulm:CTA Ext:  Foot just wrapped, dressing dry       Assessment/Plan:   -Cardiac eval still pending -Carotid dopplers show no significant stenosis -Vein mapping showed sub-optimal GSV -Final eval for treatment will be based on cardiac clearance, however at this point I am leaning toward an attempt at endovascular recanalization of his occluded popliteal artery.  He would most likely require a femoral to PT bypass with prosthetic conduit, given the appearance of his vein on u/s.   Endovascular treatment will likely give a similar long term result.  He appears not to have taken a significant decline in renal function after my angio earlier this week.  I would be able to do the intervention with the same amount of contrast.  His intervention will be scheduled for next week.  I would be ok with him coming back for treatment as an outpatient, however the patient states that his social situation may best be accomodated by keeping him in the hospital until then  Annamarie Major 08/03/2015 8:07 PM --  Filed Vitals:   08/03/15 1336 08/03/15 1950  BP: 139/77 162/69  Pulse: 82 91  Temp: 98.7 F (37.1 C) 98.8 F (37.1 C)  Resp: 18 18    Intake/Output Summary (Last 24 hours) at 08/03/15 2007 Last data filed at 08/03/15 1951  Gross per 24 hour  Intake   1080 ml  Output   3015 ml  Net  -1935 ml     Laboratory CBC    Component Value Date/Time   WBC 11.7* 08/02/2015 0345   HGB 8.0* 08/02/2015 0345   HCT 25.8* 08/02/2015 0345   HCT 32 10/31/2011 1319   PLT 644* 08/02/2015 0345    BMET    Component Value Date/Time   NA 136 08/03/2015 0250   NA 140 10/31/2011 1319   K 3.9 08/03/2015 0250   K 4.0 10/31/2011 1319   CL 101 08/03/2015 0250   CO2 29 08/03/2015 0250   GLUCOSE 170* 08/03/2015 0250   BUN 12 08/03/2015 0250   BUN 10 10/31/2011 1319   CREATININE 1.66* 08/03/2015 0250   CREATININE 0.99 10/31/2011 1319     CALCIUM 8.6* 08/03/2015 0250   CALCIUM 9.0 10/31/2011 1319   GFRNONAA 43* 08/03/2015 0250   GFRAA 49* 08/03/2015 0250    COAG Lab Results  Component Value Date   INR 1.33 07/25/2015   INR 0.97 02/17/2015   INR 1.09 11/23/2013   No results found for: PTT  Antibiotics Anti-infectives    Start     Dose/Rate Route Frequency Ordered Stop   07/27/15 1300  cefTRIAXone (ROCEPHIN) 1 g in dextrose 5 % 50 mL IVPB     1 g 100 mL/hr over 30 Minutes Intravenous Every 24 hours 07/27/15 1206     07/26/15 1100  cefUROXime (ZINACEF) 1.5 g in dextrose 5 % 50 mL IVPB  Status:  Discontinued     1.5 g 100 mL/hr over 30 Minutes Intravenous To [facility]Stay [facility] 07/25/15 1153 07/27/15 1206   07/25/15 1230  vancomycin (VANCOCIN) 1,500 mg in sodium chloride 0.9 % 500 mL IVPB  Status:  Discontinued     1,500 mg 250 mL/hr over 120 Minutes Intravenous Every 24 hours 07/25/15 1152 07/27/15 1206   07/25/15 0600  cefUROXime (ZINACEF) 1.5 g in dextrose 5 % 50 mL IVPB  Status:  Discontinued     1.5 g 100 mL/hr over 30 Minutes  Intravenous To ShortStay Surgical 07/24/15 1322 07/25/15 1153   07/24/15 1800  vancomycin (VANCOCIN) 1,500 mg in sodium chloride 0.9 % 500 mL IVPB  Status:  Discontinued     1,500 mg 250 mL/hr over 120 Minutes Intravenous Every 24 hours 07/23/15 2104 07/25/15 1152   07/24/15 0030  piperacillin-tazobactam (ZOSYN) IVPB 3.375 g  Status:  Discontinued     3.375 g 12.5 mL/hr over 240 Minutes Intravenous Every 8 hours 07/23/15 2104 07/27/15 1206   07/23/15 2115  vancomycin (VANCOCIN) IVPB 1000 mg/200 mL premix     1,000 mg 200 mL/hr over 60 Minutes Intravenous NOW 07/23/15 2104 07/23/15 2342   07/23/15 1545  piperacillin-tazobactam (ZOSYN) IVPB 3.375 g     3.375 g 12.5 mL/hr over 240 Minutes Intravenous  Once 07/23/15 1544 07/23/15 1709   07/23/15 1545  vancomycin (VANCOCIN) IVPB 1000 mg/200 mL premix     1,000 mg 200 mL/hr over 60 Minutes Intravenous  Once 07/23/15 1544 07/23/15 1812        V. Leia Alf, M.D. [facility] [place] Office: [phone] Pager:  [phone]

## 2015-08-04 DIAGNOSIS — N179 Acute kidney failure, unspecified: Secondary | ICD-10-CM | POA: Insufficient documentation

## 2015-08-04 DIAGNOSIS — I70244 Atherosclerosis of native arteries of left leg with ulceration of heel and midfoot: Secondary | ICD-10-CM

## 2015-08-04 DIAGNOSIS — E1159 Type 2 diabetes mellitus with other circulatory complications: Secondary | ICD-10-CM

## 2015-08-04 LAB — GLUCOSE, CAPILLARY
GLUCOSE-CAPILLARY: 155 mg/dL — AB (ref 65–99)
Glucose-Capillary: 132 mg/dL — ABNORMAL HIGH (ref 65–99)
Glucose-Capillary: 147 mg/dL — ABNORMAL HIGH (ref 65–99)
Glucose-Capillary: 119 mg/dL — ABNORMAL HIGH (ref 65–99)

## 2015-08-04 LAB — RENAL FUNCTION PANEL
ALBUMIN: 2.2 g/dL — AB (ref 3.5–5.0)
Anion gap: 6 (ref 5–15)
BUN: 10 mg/dL (ref 6–20)
CALCIUM: 8.8 mg/dL — AB (ref 8.9–10.3)
CO2: 28 mmol/L (ref 22–32)
Chloride: 102 mmol/L (ref 101–111)
Creatinine, Ser: 1.63 mg/dL — ABNORMAL HIGH (ref 0.61–1.24)
GFR calc Af Amer: 51 mL/min — ABNORMAL LOW (ref >60)
GFR calc non Af Amer: 44 mL/min — ABNORMAL LOW (ref >60)
GLUCOSE: 122 mg/dL — AB (ref 65–99)
PHOSPHORUS: 4.3 mg/dL (ref 2.5–4.6)
Potassium: 4 mmol/L (ref 3.5–5.1)
SODIUM: 136 mmol/L (ref 135–145)

## 2015-08-04 MED ORDER — CARVEDILOL 3.125 MG PO TABS
3.1250 mg | ORAL_TABLET | Freq: Two times a day (BID) | ORAL | Status: DC
  Administered 2015-08-04 – 2015-08-10 (×12): 3.125 mg via ORAL
  Filled 2015-08-04 (×12): qty 1

## 2015-08-04 NOTE — Progress Notes (Signed)
Physical Therapy Treatment Patient Details Name: Cory Weber MRN: [id number] DOB: August 10, 1950 Today's Date: 08/04/2015    History of Present Illness Patient is a 65 y/o male admitted with L foot infection following injury (dropping frozen chicken on his foot,) and immersing in hot water with burns.  Now s/p arteriogram.  PMH positive for HTN, HLD, DM, tobacco abuse and Hep C.    PT Comments    Pt making steady progress. Continue to feel pt needs ST-SNF due to no support at home and still requiring assist for basic mobility.  Follow Up Recommendations  SNF     Equipment Recommendations  None recommended by PT    Recommendations for Other Services       Precautions / Restrictions Precautions Precautions: Fall Restrictions Weight Bearing Restrictions: No    Mobility  Bed Mobility Overal bed mobility: Modified Independent Bed Mobility: Supine to Sit     Supine to sit: Modified independent (Device/Increase time)        Transfers Overall transfer level: Needs assistance Equipment used: Rolling walker (2 wheeled) Transfers: Sit to/from Stand Sit to Stand: Mod assist;From elevated surface         General transfer comment: Assist to bring hips up  Ambulation/Gait Ambulation/Gait assistance: Min assist Ambulation Distance (Feet): 80 Feet Assistive device: Rolling walker (2 wheeled) Gait Pattern/deviations: Step-to pattern;Decreased step length - right;Decreased stance time - left;Trunk flexed Gait velocity: decr Gait velocity interpretation: Below normal speed for age/gender General Gait Details: Verbal cues to stand more erect.   Stairs            Wheelchair Mobility    Modified Rankin (Stroke Patients Only)       Balance Overall balance assessment: Needs assistance Sitting-balance support: No upper extremity supported;Feet supported Sitting balance-Leahy Scale: Good     Standing balance support: Bilateral upper extremity supported Standing  balance-Leahy Scale: Poor Standing balance comment: Support of walker due to painful LLE                    Cognition Arousal/Alertness: Awake/alert Behavior During Therapy: WFL for tasks assessed/performed Overall Cognitive Status: Within Functional Limits for tasks assessed                      Exercises      General Comments        Pertinent Vitals/Pain Pain Assessment: Faces Faces Pain Scale: Hurts even more Pain Location: lt foot with weight bearing Pain Descriptors / Indicators: Grimacing;Guarding Pain Intervention(s): Limited activity within patient's tolerance;Monitored during session    Home Living                      Prior Function            PT Goals (current goals can now be found in the care plan section) Progress towards PT goals: Progressing toward goals    Frequency  Min 3X/week    PT Plan Current plan remains appropriate    Co-evaluation             End of Session Equipment Utilized During Treatment: Gait belt Activity Tolerance: Patient limited by pain Patient left: in bed;with call bell/phone within reach     Time: 1043-1057 PT Time Calculation (min) (ACUTE ONLY): 14 min  Charges:  $Gait Training: 8-22 mins                    G Codes:  Kimbra Marcelino 08/04/2015, 12:46 PM [facility] PT [phone]

## 2015-08-04 NOTE — Progress Notes (Signed)
TELEMETRY: Reviewed telemetry pt in NSR with occ. PVC: Filed Vitals:   08/03/15 1336 08/03/15 1950 08/03/15 2145 08/04/15 0440  BP: 139/77 162/69 140/78 138/82  Pulse: 82 91 82 78  Temp: 98.7 F (37.1 C) 98.8 F (37.1 C)  98.6 F (37 C)  TempSrc: Oral Oral  Oral  Resp: 18 18  16   Height:      Weight:      SpO2: 99% 98%  98%    Intake/Output Summary (Last 24 hours) at 08/04/15 0757 Last data filed at 08/04/15 0441  Gross per 24 hour  Intake    840 ml  Output   2655 ml  Net  -1815 ml   Filed Weights   07/23/15 1453  Weight: 199 lb (90.266 kg)    Subjective Denies any chest pain or SOB.   Marland Kitchen sodium chloride   Intravenous Once  . cefTRIAXone (ROCEPHIN)  IV  1 g Intravenous Q24H  . Chlorhexidine Gluconate Cloth  6 each Topical Daily  . docusate sodium  100 mg Oral Daily  . famotidine  20 mg Oral Daily  . folic acid  1 mg Oral Daily  . gabapentin  300 mg Oral QHS  . heparin subcutaneous  5,000 Units Subcutaneous Q8H  . insulin aspart  0-9 Units Subcutaneous TID WC  . insulin glargine  10 Units Subcutaneous Daily  . multivitamin with minerals  1 tablet Oral Daily  . mupirocin ointment  1 application Nasal BID  . polyethylene glycol  17 g Oral Daily  . silver sulfADIAZINE   Topical Daily  . simvastatin  20 mg Oral q1800  . tamsulosin  0.4 mg Oral Daily  . thiamine  100 mg Oral Daily   . sodium chloride Stopped (08/01/15 1827)    LABS: Basic Metabolic Panel:  Recent Labs  08/03/15 0250 08/04/15 0259  NA 136 136  K 3.9 4.0  CL 101 102  CO2 29 28  GLUCOSE 170* 122*  BUN 12 10  CREATININE 1.66* 1.63*  CALCIUM 8.6* 8.8*  PHOS 4.0 4.3   Liver Function Tests:  Recent Labs  08/03/15 0250 08/04/15 0259  ALBUMIN 2.0* 2.2*   No results for input(s): LIPASE, AMYLASE in the last 72 hours. CBC:  Recent Labs  08/02/15 0345  WBC 11.7*  HGB 8.0*  HCT 25.8*  MCV 86.6  PLT 644*   Cardiac Enzymes: No results for input(s): CKTOTAL, CKMB, CKMBINDEX,  TROPONINI in the last 72 hours. BNP: No results for input(s): PROBNP in the last 72 hours. D-Dimer: No results for input(s): DDIMER in the last 72 hours. Hemoglobin A1C: No results for input(s): HGBA1C in the last 72 hours. Fasting Lipid Panel: No results for input(s): CHOL, HDL, LDLCALC, TRIG, CHOLHDL, LDLDIRECT in the last 72 hours. Thyroid Function Tests: No results for input(s): TSH, T4TOTAL, T3FREE, THYROIDAB in the last 72 hours.  Invalid input(s): FREET3   Radiology/Studies:  No results found.   Echo: Study Conclusions  - Left ventricle: Septal and apical hypokinesis. The cavity size  was mildly dilated. Wall thickness was increased in a pattern of  moderate LVH. Systolic function was mildly reduced. The estimated  ejection fraction was in the range of 45% to 50%. Wall motion was  normal; there were no regional wall motion abnormalities. Left  ventricular diastolic function parameters were normal. - Aortic valve: Severely calcified non coronary cusp. - Mitral valve: Calcified annulus. Mildly thickened leaflets . - Atrial septum: No defect or patent foramen ovale was identified.  PHYSICAL EXAM General: Pleasant, African American male appearing in NAD. Psych: Normal affect. Neuro: Alert and oriented X 3. Moves all extremities spontaneously. HEENT: Normal Neck: Supple without bruits or JVD. Lungs: Resp regular and unlabored, CTA without wheezing or rales. Heart: RRR no s3, s4, or murmurs. Abdomen: Soft, non-tender, non-distended, BS + x 4.  Extremities: No clubbing, cyanosis or edema. DP/PT/Radials 1+ and equal bilaterally. Left toes with drainage present, very malodorous.  ASSESSMENT AND PLAN: 1. PAD with gangrenous left foot. Plan left fem-pop bypass early next week. Patient is cleared from a cardiac standpoint. EF mildly reduced (global) by Echo. No angina. Will start low dose Coreg and titrate as tolerated. May want to consider resuming  ACEi post  op if renal function remains stable. 2. CKD stage 3 3. HTN 4. Cellulitis left foot.  5. HLD on statin   Present on Admission:  . Left foot infection . Peripheral vascular disease, unspecified ([place]) . Essential hypertension . Cellulitis L foot . Type 2 diabetes mellitus with vascular disease ([place]) . Normocytic normochromic anemia . Cellulitis of left foot  Signed, Peter Martinique, [place] 08/04/2015 7:57 AM

## 2015-08-04 NOTE — Progress Notes (Addendum)
Progress Note    LERRY COUNSELMAN  [id number] DOB: Jul 24, 1950  DOA: 07/23/2015 PCP: Maggie Font, MD    Brief Narrative:   Cory Weber is an 65 y.o. male with past medical history significant for diabetes mellitus type 2, hypertension, hyperlipidemia and tobacco abuse who was recently admitted for left foot cellulitis after patient had immersed his foot in hot water (but due to his neuropathy he did not realize how hot the water was). At that time patient had blister on his right great toe which gone down but developed wound around the great toe. Patient was admitted and placed on antibiotics and vascular surgeon was consulted and ABI was 0.4. Patient was referred to Vascular surgeon as outpatient with Dr. Bridgett Larsson. Patient states his pain worsened last 2-3 days after discharge with new blisters forming on the dorsal aspect of his left foot around the lateral toes. There is also gangrenous appearing left toe. Patient is mildly tachycardic and has leukocytosis. On-call vascular surgeon Dr. Trula Slade at [name] was consulted by the ER physician. Patient transferred to [facility] for further management of his cellulitis and peripheral vascular disease. Patient also has anemia and during last admission was noted to have stool for occult blood positive. Patient was taking NSAIDs and was instructed to stop and was advised further GI workup as outpatient.  Assessment/Plan:   Principal Problem:   Cellulitis L foot Complicated by peripheral vascular disease Initially placed on vancomycin and Zosyn, antibiotics subsequently changed to Rocephin. Underwent angiogram which showed popliteal occlusion. Vascular surgery following with consideration for femoral-posterior tibial bypass. Underwent carotid Dopplers and vein mapping 08/02/15 in anticipation of surgery. No significant ICA stenosis noted. Cardiology consulted for cardiac clearance preoperatively. 2-D Echo showed EF 45-50% with no regional wall  motion abnormalities, with prior Echo done 06/25/13 showing EF of 50-55%.  No dramatic change. Considered low risk.   Active Problems:   Essential hypertension Lasix and HCTZ on hold. Low-dose Coreg started by cardiology. Resume ACE inhibitor postoperatively if renal function remained stable.    Type 2 diabetes mellitus with vascular disease ([facility]) Oral hypoglycemics on hold. Currently being managed with insulin sensitive SSI. CBGs [id number]. Add 10 units of Lantus daily.    Normocytic normochromic anemia Was Hemoccult positive on recent admission. Patient was instructed to stop taking NSAIDs. Continue Pepcid. Received 1 unit of PRBCs. Recheck hemoglobin in the morning.    Hyperlipidemia Continue statin.    Tobacco abuse Tobacco cessation counseling requested.    History of hepatitis C with possible cirrhosis Status post antiviral treatment for hepatitis C.    Stage III chronic kidney disease/urinary retention Patient was started on Flomax and now has a Foley in place. Status post renal ultrasound showed medical renal disease. Status post nephrology evaluation.    Acute encephalopathy Pain medications discontinued and dose of Neurontin decreased with improvement in mental status.  Family Communication/Anticipated D/C date and plan/Code Status   DVT prophylaxis: Heparin ordered. Code Status: Full Code.  Family Communication: [name] [phone] (daughter) is emergency contact. Updated by telephone. Disposition Plan: Likely home when surgery completed and patient stable.   Medical Consultants:    Cardiology  Vascular Surgery  Nephrology   Procedures:   2 D Echo 08/03/15 Study Conclusions  - Left ventricle: Septal and apical hypokinesis. The cavity size  was mildly dilated. Wall thickness was increased in a pattern of  moderate LVH. Systolic function was mildly reduced. The estimated  ejection fraction was  in the range of 45% to 50%. Wall motion was   normal; there were no regional wall motion abnormalities. Left  ventricular diastolic function parameters were normal. - Aortic valve: Severely calcified non coronary cusp. - Mitral valve: Calcified annulus. Mildly thickened leaflets . - Atrial septum: No defect or patent foramen ovale was identified.  Carotid Dopplers 08/02/15 Summary:  - The vertebral arteries appear patent with antegrade flow. - Findings consistent with 1-39 percent stenosis involving the  right internal carotid artery and the left internal carotid  artery.  Vascular US Lower Extremity Saphenous Vein Mapping 08/02/15    Anti-Infectives:   Vancomycin 07/23/15---> 07/27/15 Cefuroxime 07/23/15 --->07/27/15 Rocephin 07/27/15--->  Subjective:   Cory Weber says he is having ongoing foot pain, Percocet helps.  Still has not moved his bowels but is amenable to trying a laxative. No dyspnea or chest pain. No nausea or vomiting. Appetite is fair.  Objective:    Filed Vitals:   08/03/15 1336 08/03/15 1950 08/03/15 2145 08/04/15 0440  BP: 139/77 162/69 140/78 138/82  Pulse: 82 91 82 78  Temp: 98.7 F (37.1 C) 98.8 F (37.1 C)  98.6 F (37 C)  TempSrc: Oral Oral  Oral  Resp: 18 18  16   Height:      Weight:      SpO2: 99% 98%  98%    Intake/Output Summary (Last 24 hours) at 08/04/15 0832 Last data filed at 08/04/15 0441  Gross per 24 hour  Intake    840 ml  Output   2655 ml  Net  -1815 ml   Filed Weights   07/23/15 1453  Weight: 90.266 kg (199 lb)    Exam: General exam: Appears calm and comfortable.  Respiratory system: Clear to auscultation. Respiratory effort normal. Cardiovascular system: S1 & S2 heard, RRR. No JVD,  rubs, gallops or clicks. No murmurs. Gastrointestinal system: Abdomen is nondistended, soft and nontender. No organomegaly or masses felt. Normal bowel sounds heard. Central nervous system: Alert and oriented. No focal neurological deficits. Extremities: Left foot as pictured  below.Dressings intact and not examined today. Skin: No rashes, lesions or ulcers except as noted on left foot below. Psychiatry: Judgement and insight appear normal. Mood & affect appropriate.      Data Reviewed:   I have personally reviewed following labs and imaging studies:  Labs: Basic Metabolic Panel:  Recent Labs Lab 07/31/15 0412 08/01/15 0307 08/02/15 0330 08/03/15 0250 08/04/15 0259  NA 135 136 137 136 136  K 4.1 4.1 4.3 3.9 4.0  CL 101 102 102 101 102  CO2 26 25 29 29 28   GLUCOSE 160* 125* 162* 170* 122*  BUN 15 13 11 12 10   CREATININE 1.69* 1.72* 1.67* 1.66* 1.63*  CALCIUM 8.6* 8.5* 8.8* 8.6* 8.8*  PHOS 3.7 4.1 4.2 4.0 4.3   GFR Estimated Creatinine Clearance: 53.1 mL/min (by C-G formula based on Cr of 1.63). Liver Function Tests:  Recent Labs Lab 07/31/15 0412 08/01/15 0307 08/02/15 0330 08/03/15 0250 08/04/15 0259  ALBUMIN 2.1* 2.0* 2.0* 2.0* 2.2*    Recent Labs Lab 07/29/15 1110  AMMONIA 14   CBC:  Recent Labs Lab 07/29/15 0440 07/30/15 1348 08/01/15 0307 08/02/15 0345  WBC 11.4* 10.8* 11.2* 11.7*  HGB 7.2* 7.7* 7.3* 8.0*  HCT 23.5* 24.8* 23.7* 25.8*  MCV 86.1 86.4 87.5 86.6  PLT 495* 544* 573* 644*   CBG:  Recent Labs Lab 08/03/15 0551 08/03/15 1123 08/03/15 1516 08/03/15 2135 08/04/15 0555  GLUCAP 168* 182* 162*  130* 132*   Microbiology Recent Results (from the past 240 hour(s))  Surgical pcr screen     Status: Abnormal   Collection Time: 07/31/15  9:51 PM  Result Value Ref Range Status   MRSA, PCR NEGATIVE NEGATIVE Final   Staphylococcus aureus POSITIVE (A) NEGATIVE Final    Comment:        The Xpert SA Assay (FDA approved for NASAL specimens in patients over 75 years of age), is one component of a comprehensive surveillance program.  Test performance has been validated by [facility] for patients greater than or equal to 2 year old. It is not intended to diagnose infection nor to guide or monitor  treatment.     Radiology: No results found.  Medications:   . sodium chloride   Intravenous Once  . carvedilol  3.125 mg Oral BID WC  . cefTRIAXone (ROCEPHIN)  IV  1 g Intravenous Q24H  . Chlorhexidine Gluconate Cloth  6 each Topical Daily  . docusate sodium  100 mg Oral Daily  . famotidine  20 mg Oral Daily  . folic acid  1 mg Oral Daily  . gabapentin  300 mg Oral QHS  . heparin subcutaneous  5,000 Units Subcutaneous Q8H  . insulin aspart  0-9 Units Subcutaneous TID WC  . insulin glargine  10 Units Subcutaneous Daily  . multivitamin with minerals  1 tablet Oral Daily  . mupirocin ointment  1 application Nasal BID  . polyethylene glycol  17 g Oral Daily  . silver sulfADIAZINE   Topical Daily  . simvastatin  20 mg Oral q1800  . tamsulosin  0.4 mg Oral Daily  . thiamine  100 mg Oral Daily   Continuous Infusions: . sodium chloride Stopped (08/01/15 1827)    Time spent: 25 minutes.   LOS: 12 days   [facility] Pager [phone]. If unable to reach me by pager, please call my cell phone at [phone].  *Please refer to [email or web address], password [email or web address] to get updated schedule on who will round on this patient, as hospitalists switch teams weekly. If 7PM-7AM, please contact night-coverage at [email or web address], password [email or web address] for any overnight needs.  08/04/2015, 8:32 AM

## 2015-08-05 LAB — RENAL FUNCTION PANEL
Albumin: 2.5 g/dL — ABNORMAL LOW (ref 3.5–5.0)
Anion gap: 6 (ref 5–15)
BUN: 11 mg/dL (ref 6–20)
CO2: 30 mmol/L (ref 22–32)
Calcium: 9.2 mg/dL (ref 8.9–10.3)
Chloride: 101 mmol/L (ref 101–111)
Creatinine, Ser: 1.59 mg/dL — ABNORMAL HIGH (ref 0.61–1.24)
GFR calc Af Amer: 52 mL/min — ABNORMAL LOW (ref >60)
GFR calc non Af Amer: 45 mL/min — ABNORMAL LOW (ref >60)
Glucose, Bld: 110 mg/dL — ABNORMAL HIGH (ref 65–99)
Phosphorus: 3.9 mg/dL (ref 2.5–4.6)
Potassium: 4.3 mmol/L (ref 3.5–5.1)
Sodium: 137 mmol/L (ref 135–145)

## 2015-08-05 LAB — GLUCOSE, CAPILLARY
GLUCOSE-CAPILLARY: 118 mg/dL — AB (ref 65–99)
Glucose-Capillary: 170 mg/dL — ABNORMAL HIGH (ref 65–99)
Glucose-Capillary: 166 mg/dL — ABNORMAL HIGH (ref 65–99)
Glucose-Capillary: 148 mg/dL — ABNORMAL HIGH (ref 65–99)

## 2015-08-05 MED ORDER — CHLORHEXIDINE GLUCONATE CLOTH 2 % EX PADS
6.0000 | MEDICATED_PAD | Freq: Every day | CUTANEOUS | Status: AC
  Administered 2015-08-09: 6 via TOPICAL

## 2015-08-05 NOTE — Progress Notes (Signed)
Progress Note    Cory Weber  [id number] DOB: [birth year]  DOA: 07/23/2015 PCP: Maggie Font, MD    Brief Narrative:   Cory Weber is an 65 y.o. male with past medical history significant for diabetes mellitus type 2, hypertension, hyperlipidemia and tobacco abuse who was recently admitted for left foot cellulitis after patient had immersed his foot in hot water (but due to his neuropathy he did not realize how hot the water was). At that time patient had blister on his right great toe which gone down but developed wound around the great toe. Patient was admitted and placed on antibiotics and vascular surgeon was consulted and ABI was 0.4. Patient was referred to Vascular surgeon as outpatient with Dr. Bridgett Larsson. Patient states his pain worsened last 2-3 days after discharge with new blisters forming on the dorsal aspect of his left foot around the lateral toes. There is also gangrenous appearing left toe. Patient is mildly tachycardic and has leukocytosis. On-call vascular surgeon Dr. Trula Slade at [name] was consulted by the ER physician. Patient transferred to [facility] for further management of his cellulitis and peripheral vascular disease. Patient also has anemia and during last admission was noted to have stool for occult blood positive. Patient was taking NSAIDs and was instructed to stop and was advised further GI workup as outpatient.  Assessment/Plan:   Principal Problem:   Cellulitis L foot Complicated by peripheral vascular disease Initially placed on vancomycin and Zosyn, antibiotics subsequently changed to Rocephin. Underwent angiogram which showed popliteal occlusion. Vascular surgery following with consideration for femoral-posterior tibial bypass. Underwent carotid Dopplers and vein mapping 08/02/15 in anticipation of surgery. No significant ICA stenosis noted. Cardiology consulted for cardiac clearance preoperatively. 2-D Echo showed EF 45-50% with no regional wall  motion abnormalities, with prior Echo done 06/25/13 showing EF of 50-55%.  No dramatic change. Considered low risk. Awaiting surgery.  Active Problems:   Essential hypertension Lasix and HCTZ on hold. Low-dose Coreg started by cardiology. Resume ACE inhibitor postoperatively if renal function remains stable.    Type 2 diabetes mellitus with vascular disease (HCC) Oral hypoglycemics on hold. Currently being managed with insulin sensitive SSI and 10 units of Lantus daily. CBGs 118-155.     Normocytic normochromic anemia Was Hemoccult positive on recent admission. Patient was instructed to stop taking NSAIDs. Continue Pepcid. Received 1 unit of PRBCs. Monitor hemoglobin periodically.    Hyperlipidemia Continue statin.    Tobacco abuse Tobacco cessation counseling requested.    History of hepatitis C with possible cirrhosis Status post antiviral treatment for hepatitis C.    Stage III chronic kidney disease/urinary retention Patient was started on Flomax and now has a Foley in place. Status post renal ultrasound showed medical renal disease. Status post nephrology evaluation.    Acute encephalopathy Pain medications discontinued and dose of Neurontin decreased with improvement in mental status.  Family Communication/Anticipated D/C date and plan/Code Status   DVT prophylaxis: Heparin ordered. Code Status: Full Code.  Family Communication: Cory Weber [phone] (daughter) is emergency contact. Updated by telephone 08/04/15. Visitor at the bedside today. Disposition Plan: Likely home when surgery completed and patient stable.   Medical Consultants:    Cardiology  Vascular Surgery  Nephrology   Procedures:   2 D Echo 08/03/15 Study Conclusions  - Left ventricle: Septal and apical hypokinesis. The cavity size  was mildly dilated. Wall thickness was increased in a pattern of  moderate LVH. Systolic function was mildly reduced.  The estimated  ejection fraction was  in the range of 45% to 50%. Wall motion was  normal; there were no regional wall motion abnormalities. Left  ventricular diastolic function parameters were normal. - Aortic valve: Severely calcified non coronary cusp. - Mitral valve: Calcified annulus. Mildly thickened leaflets . - Atrial septum: No defect or patent foramen ovale was identified.  Carotid Dopplers 08/02/15 Summary:  - The vertebral arteries appear patent with antegrade flow. - Findings consistent with 1-39 percent stenosis involving the  right internal carotid artery and the left internal carotid  artery.  Vascular US Lower Extremity Saphenous Vein Mapping 08/02/15    Anti-Infectives:   Vancomycin 07/23/15---> 07/27/15 Cefuroxime 07/23/15 --->07/27/15 Rocephin 07/27/15--->  Subjective:   Cory Weber feels well, still with some foot pain. No dyspnea or chest pain. No nausea or vomiting. Appetite is good.  Objective:    Filed Vitals:   08/04/15 2000 08/05/15 0341 08/05/15 0354 08/05/15 0805  BP: 168/84 166/90 146/84 138/80  Pulse: 92 89 89 97  Temp: 98.2 F (36.8 C)  97.8 F (36.6 C)   TempSrc: Axillary  Oral   Resp: 18  18   Height:      Weight:      SpO2: 99%  98%     Intake/Output Summary (Last 24 hours) at 08/05/15 0817 Last data filed at 08/05/15 0730  Gross per 24 hour  Intake    960 ml  Output   2700 ml  Net  -1740 ml   Filed Weights   07/23/15 1453  Weight: 90.266 kg (199 lb)    Exam: General exam: Appears calm and comfortable.  Respiratory system: Clear to auscultation. Respiratory effort normal. Cardiovascular system: S1 & S2 heard, RRR. No JVD,  rubs, gallops or clicks. No murmurs. Gastrointestinal system: Abdomen is nondistended, soft and nontender. No organomegaly or masses felt. Normal bowel sounds heard. Central nervous system: Alert and oriented. No focal neurological deficits. Extremities: Left foot as pictured below.Dressings intact and not examined today. Skin: No  rashes, lesions or ulcers except as noted on left foot below. Psychiatry: Judgement and insight appear normal. Mood & affect appropriate.      Data Reviewed:   I have personally reviewed following labs and imaging studies:  Labs: Basic Metabolic Panel:  Recent Labs Lab 08/01/15 0307 08/02/15 0330 08/03/15 0250 08/04/15 0259 08/05/15 0339  NA 136 137 136 136 137  K 4.1 4.3 3.9 4.0 4.3  CL 102 102 101 102 101  CO2 25 29 29 28 30   GLUCOSE 125* 162* 170* 122* 110*  BUN 13 11 12 10 11   CREATININE 1.72* 1.67* 1.66* 1.63* 1.59*  CALCIUM 8.5* 8.8* 8.6* 8.8* 9.2  PHOS 4.1 4.2 4.0 4.3 3.9   GFR Estimated Creatinine Clearance: 54.4 mL/min (by C-G formula based on Cr of 1.59). Liver Function Tests:  Recent Labs Lab 08/01/15 0307 08/02/15 0330 08/03/15 0250 08/04/15 0259 08/05/15 0339  ALBUMIN 2.0* 2.0* 2.0* 2.2* 2.5*    Recent Labs Lab 07/29/15 1110  AMMONIA 14   CBC:  Recent Labs Lab 07/30/15 1348 08/01/15 0307 08/02/15 0345  WBC 10.8* 11.2* 11.7*  HGB 7.7* 7.3* 8.0*  HCT 24.8* 23.7* 25.8*  MCV 86.4 87.5 86.6  PLT 544* 573* 644*   CBG:  Recent Labs Lab 08/04/15 0555 08/04/15 1118 08/04/15 1644 08/04/15 2112 08/05/15 0559  GLUCAP 132* 147* 119* 155* 118*   Microbiology Recent Results (from the past 240 hour(s))  Surgical pcr screen  Status: Abnormal   Collection Time: 07/31/15  9:51 PM  Result Value Ref Range Status   MRSA, PCR NEGATIVE NEGATIVE Final   Staphylococcus aureus POSITIVE (A) NEGATIVE Final    Comment:        The Xpert SA Assay (FDA approved for NASAL specimens in patients over 67 years of age), is one component of a comprehensive surveillance program.  Test performance has been validated by [facility] for patients greater than or equal to 76 year old. It is not intended to diagnose infection nor to guide or monitor treatment.     Radiology: No results found.  Medications:   . sodium chloride   Intravenous Once    . carvedilol  3.125 mg Oral BID WC  . cefTRIAXone (ROCEPHIN)  IV  1 g Intravenous Q24H  . Chlorhexidine Gluconate Cloth  6 each Topical Daily  . docusate sodium  100 mg Oral Daily  . famotidine  20 mg Oral Daily  . folic acid  1 mg Oral Daily  . gabapentin  300 mg Oral QHS  . heparin subcutaneous  5,000 Units Subcutaneous Q8H  . insulin aspart  0-9 Units Subcutaneous TID WC  . insulin glargine  10 Units Subcutaneous Daily  . multivitamin with minerals  1 tablet Oral Daily  . mupirocin ointment  1 application Nasal BID  . polyethylene glycol  17 g Oral Daily  . silver sulfADIAZINE   Topical Daily  . simvastatin  20 mg Oral q1800  . tamsulosin  0.4 mg Oral Daily  . thiamine  100 mg Oral Daily   Continuous Infusions: . sodium chloride Stopped (08/01/15 1827)    Time spent: 25 minutes.   LOS: 13 days   [facility] Pager [phone]. If unable to reach me by pager, please call my cell phone at [phone].  *Please refer to [email or web address], password [email or web address] to get updated schedule on who will round on this patient, as hospitalists switch teams weekly. If 7PM-7AM, please contact night-coverage at [email or web address], password [email or web address] for any overnight needs.  08/05/2015, 8:17 AM

## 2015-08-05 NOTE — Progress Notes (Signed)
Patient ID: Cory Weber, male   DOB: 1950-05-08, 65 y.o.   MRN: [id number]   TELEMETRY: Reviewed telemetry pt in NSR with occ. PVC: Filed Vitals:   08/04/15 2000 08/05/15 0341 08/05/15 0354 08/05/15 0805  BP: 168/84 166/90 146/84 138/80  Pulse: 92 89 89 97  Temp: 98.2 F (36.8 C)  97.8 F (36.6 C)   TempSrc: Axillary  Oral   Resp: 18  18   Height:      Weight:      SpO2: 99%  98%     Intake/Output Summary (Last 24 hours) at 08/05/15 1112 Last data filed at 08/05/15 0834  Gross per 24 hour  Intake    956 ml  Output   2400 ml  Net  -1444 ml   Filed Weights   07/23/15 1453  Weight: 199 lb (90.266 kg)    Subjective Denies any chest pain or SOB.   Marland Kitchen sodium chloride   Intravenous Once  . carvedilol  3.125 mg Oral BID WC  . cefTRIAXone (ROCEPHIN)  IV  1 g Intravenous Q24H  . Chlorhexidine Gluconate Cloth  6 each Topical Daily  . docusate sodium  100 mg Oral Daily  . famotidine  20 mg Oral Daily  . folic acid  1 mg Oral Daily  . gabapentin  300 mg Oral QHS  . heparin subcutaneous  5,000 Units Subcutaneous Q8H  . insulin aspart  0-9 Units Subcutaneous TID WC  . insulin glargine  10 Units Subcutaneous Daily  . multivitamin with minerals  1 tablet Oral Daily  . mupirocin ointment  1 application Nasal BID  . polyethylene glycol  17 g Oral Daily  . silver sulfADIAZINE   Topical Daily  . simvastatin  20 mg Oral q1800  . tamsulosin  0.4 mg Oral Daily  . thiamine  100 mg Oral Daily   . sodium chloride Stopped (08/01/15 1827)    LABS: Basic Metabolic Panel:  Recent Labs  08/04/15 0259 08/05/15 0339  NA 136 137  K 4.0 4.3  CL 102 101  CO2 28 30  GLUCOSE 122* 110*  BUN 10 11  CREATININE 1.63* 1.59*  CALCIUM 8.8* 9.2  PHOS 4.3 3.9   Liver Function Tests:  Recent Labs  08/04/15 0259 08/05/15 0339  ALBUMIN 2.2* 2.5*     Radiology/Studies:  No results found.   Echo: Study Conclusions  - Left ventricle: Septal and apical hypokinesis. The cavity  size  was mildly dilated. Wall thickness was increased in a pattern of  moderate LVH. Systolic function was mildly reduced. The estimated  ejection fraction was in the range of 45% to 50%. Wall motion was  normal; there were no regional wall motion abnormalities. Left  ventricular diastolic function parameters were normal. - Aortic valve: Severely calcified non coronary cusp. - Mitral valve: Calcified annulus. Mildly thickened leaflets . - Atrial septum: No defect or patent foramen ovale was identified.  PHYSICAL EXAM General: Pleasant, African American male appearing in NAD. Psych: Normal affect. Neuro: Alert and oriented X 3. Moves all extremities spontaneously. HEENT: Normal Neck: Supple without bruits or JVD. Lungs: Resp regular and unlabored, CTA without wheezing or rales. Heart: RRR no s3, s4, or murmurs. Abdomen: Soft, non-tender, non-distended, BS + x 4.  Extremities: No clubbing, cyanosis or edema. DP/PT/Radials 1+ and equal bilaterally. Left toes with drainage present, very malodorous.  ASSESSMENT AND PLAN: 1. PAD with gangrenous left foot. Plan left fem-pop bypass early next week. Patient is cleared from a cardiac  standpoint. EF mildly reduced (global) by Echo. No angina. PVC;s gone On telemetry with beta blocker Consider adding ACE post op depending on Cr 2. CKD stage 3  CR 1.59 stable  3. HTN  Normal on beta blocker suppl with hydralazine/nitrates vs ACE post op  4. Cellulitis left foot. Surgery next week per [facility] on Rocephin 5. HLD on statin  Jenkins Rouge

## 2015-08-06 DIAGNOSIS — L03119 Cellulitis of unspecified part of limb: Secondary | ICD-10-CM

## 2015-08-06 LAB — GLUCOSE, CAPILLARY
GLUCOSE-CAPILLARY: 173 mg/dL — AB (ref 65–99)
GLUCOSE-CAPILLARY: 188 mg/dL — AB (ref 65–99)
GLUCOSE-CAPILLARY: 120 mg/dL — AB (ref 65–99)
Glucose-Capillary: 189 mg/dL — ABNORMAL HIGH (ref 65–99)

## 2015-08-06 LAB — RENAL FUNCTION PANEL
Albumin: 2.4 g/dL — ABNORMAL LOW (ref 3.5–5.0)
Anion gap: 7 (ref 5–15)
BUN: 12 mg/dL (ref 6–20)
CALCIUM: 9 mg/dL (ref 8.9–10.3)
CHLORIDE: 101 mmol/L (ref 101–111)
CO2: 28 mmol/L (ref 22–32)
CREATININE: 1.64 mg/dL — AB (ref 0.61–1.24)
GFR calc non Af Amer: 43 mL/min — ABNORMAL LOW (ref >60)
GFR, EST AFRICAN AMERICAN: 50 mL/min — AB (ref >60)
Glucose, Bld: 147 mg/dL — ABNORMAL HIGH (ref 65–99)
Phosphorus: 4.3 mg/dL (ref 2.5–4.6)
Potassium: 4.4 mmol/L (ref 3.5–5.1)
SODIUM: 136 mmol/L (ref 135–145)

## 2015-08-06 MED ORDER — MUPIROCIN 2 % EX OINT
1.0000 "application " | TOPICAL_OINTMENT | Freq: Two times a day (BID) | CUTANEOUS | Status: DC
  Administered 2015-08-06 – 2015-08-10 (×8): 1 via NASAL
  Filled 2015-08-06 (×2): qty 22

## 2015-08-06 MED ORDER — INSULIN ASPART 100 UNIT/ML ~~LOC~~ SOLN: 0.0000 [IU] | Freq: Every day | SUBCUTANEOUS | Status: DC

## 2015-08-06 MED ORDER — INSULIN ASPART 100 UNIT/ML ~~LOC~~ SOLN
0.0000 [IU] | Freq: Three times a day (TID) | SUBCUTANEOUS | Status: DC
  Administered 2015-08-06 – 2015-08-07 (×2): 2 [IU] via SUBCUTANEOUS
  Administered 2015-08-07 – 2015-08-08 (×2): 5 [IU] via SUBCUTANEOUS
  Administered 2015-08-08: 2 [IU] via SUBCUTANEOUS
  Administered 2015-08-09: 1 [IU] via SUBCUTANEOUS
  Administered 2015-08-09: 2 [IU] via SUBCUTANEOUS
  Administered 2015-08-09 – 2015-08-10 (×2): 1 [IU] via SUBCUTANEOUS

## 2015-08-06 MED ORDER — INSULIN ASPART 100 UNIT/ML ~~LOC~~ SOLN
3.0000 [IU] | Freq: Three times a day (TID) | SUBCUTANEOUS | Status: DC
  Administered 2015-08-06 (×2): 3 [IU] via SUBCUTANEOUS

## 2015-08-06 NOTE — Progress Notes (Signed)
Patient lying in bed, no needs at this time. Dressing changed, call light within reach.

## 2015-08-06 NOTE — Progress Notes (Signed)
Patient ID: Cory Weber, male   DOB: Oct 06, 1950, 65 y.o.   MRN: [id number]   TELEMETRY: Reviewed telemetry pt in NSR with occ. PVC: 08/06/2015  Filed Vitals:   08/05/15 0805 08/05/15 1423 08/05/15 2158 08/06/15 0538  BP: 138/80 149/76 174/92 148/74  Pulse: 97 83 89 77  Temp:  98.7 F (37.1 C) 98.6 F (37 C) 98.8 F (37.1 C)  TempSrc:  Oral Oral Oral  Resp:  18 18 16   Height:      Weight:      SpO2:  97% 99% 95%    Intake/Output Summary (Last 24 hours) at 08/06/15 1109 Last data filed at 08/06/15 0937  Gross per 24 hour  Intake    333 ml  Output   2350 ml  Net  -2017 ml   Filed Weights   07/23/15 1453  Weight: 199 lb (90.266 kg)    Subjective Denies any chest pain or SOB.   Marland Kitchen sodium chloride   Intravenous Once  . carvedilol  3.125 mg Oral BID WC  . cefTRIAXone (ROCEPHIN)  IV  1 g Intravenous Q24H  . Chlorhexidine Gluconate Cloth  6 each Topical Daily  . docusate sodium  100 mg Oral Daily  . famotidine  20 mg Oral Daily  . folic acid  1 mg Oral Daily  . gabapentin  300 mg Oral QHS  . heparin subcutaneous  5,000 Units Subcutaneous Q8H  . insulin aspart  0-5 Units Subcutaneous QHS  . insulin aspart  0-9 Units Subcutaneous TID WC  . insulin aspart  3 Units Subcutaneous TID WC  . insulin glargine  10 Units Subcutaneous Daily  . multivitamin with minerals  1 tablet Oral Daily  . mupirocin ointment  1 application Nasal BID  . polyethylene glycol  17 g Oral Daily  . silver sulfADIAZINE   Topical Daily  . simvastatin  20 mg Oral q1800  . tamsulosin  0.4 mg Oral Daily  . thiamine  100 mg Oral Daily   . sodium chloride Stopped (08/01/15 1827)    LABS: Basic Metabolic Panel:  Recent Labs  08/05/15 0339 08/06/15 0258  NA 137 136  K 4.3 4.4  CL 101 101  CO2 30 28  GLUCOSE 110* 147*  BUN 11 12  CREATININE 1.59* 1.64*  CALCIUM 9.2 9.0  PHOS 3.9 4.3   Liver Function Tests:  Recent Labs  08/05/15 0339 08/06/15 0258  ALBUMIN 2.5* 2.4*      Radiology/Studies:  No results found.   Echo: Study Conclusions  - Left ventricle: Septal and apical hypokinesis. The cavity size  was mildly dilated. Wall thickness was increased in a pattern of  moderate LVH. Systolic function was mildly reduced. The estimated  ejection fraction was in the range of 45% to 50%. Wall motion was  normal; there were no regional wall motion abnormalities. Left  ventricular diastolic function parameters were normal. - Aortic valve: Severely calcified non coronary cusp. - Mitral valve: Calcified annulus. Mildly thickened leaflets . - Atrial septum: No defect or patent foramen ovale was identified.  PHYSICAL EXAM General: Pleasant, African American male appearing in NAD. Psych: Normal affect. Neuro: Alert and oriented X 3. Moves all extremities spontaneously. HEENT: Normal Neck: Supple without bruits or JVD. Lungs: Resp regular and unlabored, CTA without wheezing or rales. Heart: RRR no s3, s4, or murmurs. Abdomen: Soft, non-tender, non-distended, BS + x 4.  Extremities: No clubbing, cyanosis or edema. DP/PT/Radials 1+ and equal bilaterally. Left toes with drainage present, very  malodorous.  ASSESSMENT AND PLAN: 1. PAD with gangrenous left foot. Plan left fem-pop bypass early next week. Patient is cleared from a cardiac standpoint. EF mildly reduced (global) by Echo. No angina. PVC;s gone On telemetry with beta blocker Consider adding ACE post op depending on Cr 2. CKD stage 3  CR 1.59 stable  3. HTN  Normal on beta blocker suppl with hydralazine/nitrates vs ACE post op  4. Cellulitis left foot. Surgery next week per [facility] on Rocephin 5. HLD on statin  Jenkins Rouge

## 2015-08-06 NOTE — Progress Notes (Signed)
Plan for attempt at percutaneous revascularization on Tuesday Morning.  NPO after midnight on Monday   Cory Weber

## 2015-08-06 NOTE — Progress Notes (Signed)
Progress Note    Cory Weber  [id number] DOB: 1950/08/05  DOA: 07/23/2015 PCP: Maggie Font, MD    Brief Narrative:   Cory Weber is an 65 y.o. male with past medical history significant for diabetes mellitus type 2, hypertension, hyperlipidemia and tobacco abuse who was recently admitted for left foot cellulitis after patient had immersed his foot in hot water (but due to his neuropathy he did not realize how hot the water was). At that time patient had blister on his right great toe which gone down but developed wound around the great toe. Patient was admitted and placed on antibiotics and vascular surgeon was consulted and ABI was 0.4. Patient was referred to Vascular surgeon as outpatient with Dr. Bridgett Larsson. Patient states his pain worsened last 2-3 days after discharge with new blisters forming on the dorsal aspect of his left foot around the lateral toes. There is also gangrenous appearing left toe. Patient is mildly tachycardic and has leukocytosis. On-call vascular surgeon Dr. Trula Slade at [name] was consulted by the ER physician. Patient transferred to [facility] for further management of his cellulitis and peripheral vascular disease. Patient also has anemia and during last admission was noted to have stool for occult blood positive. Patient was taking NSAIDs and was instructed to stop and was advised further GI workup as outpatient.  Assessment/Plan:   Principal Problem:   Cellulitis L foot Complicated by peripheral vascular disease Initially placed on vancomycin and Zosyn, antibiotics subsequently changed to Rocephin. Underwent angiogram which showed popliteal occlusion. Vascular surgery following with consideration for femoral-posterior tibial bypass. Underwent carotid Dopplers and vein mapping 08/02/15 in anticipation of surgery. No significant ICA stenosis noted. Cardiology consulted for cardiac clearance preoperatively. 2-D Echo showed EF 45-50% with no regional wall  motion abnormalities, with prior Echo done 06/25/13 showing EF of 50-55%.  No dramatic change. Considered low risk. Awaiting surgery.  Active Problems:   Essential hypertension Lasix and HCTZ on hold. Low-dose Coreg started by cardiology. Resume ACE inhibitor postoperatively if renal function remains stable.    Type 2 diabetes mellitus with vascular disease (HCC) Oral hypoglycemics on hold. Currently being managed with insulin sensitive SSI and 10 units of Lantus daily. CBGs 118-173. Add 3 units of meal coverage.    Normocytic normochromic anemia Was Hemoccult positive on recent admission. Patient was instructed to stop taking NSAIDs. Continue Pepcid. Received 1 unit of PRBCs. Monitor hemoglobin periodically.    Hyperlipidemia Continue statin.    Tobacco abuse Tobacco cessation counseling requested.    History of hepatitis C with possible cirrhosis Status post antiviral treatment for hepatitis C.    Stage III chronic kidney disease/urinary retention Patient was started on Flomax and now has a Foley in place. Status post renal ultrasound showed medical renal disease. Status post nephrology evaluation.    Acute encephalopathy Pain medications discontinued and dose of Neurontin decreased with improvement in mental status.  Family Communication/Anticipated D/C date and plan/Code Status   DVT prophylaxis: Heparin ordered. Code Status: Full Code.  Family Communication: [name] [phone] (daughter) is emergency contact. Updated by telephone 08/04/15. Visitor at the bedside today. Disposition Plan: Likely home when surgery completed and patient stable.   Medical Consultants:    Cardiology  Vascular Surgery  Nephrology   Procedures:   2 D Echo 08/03/15 Study Conclusions  - Left ventricle: Septal and apical hypokinesis. The cavity size  was mildly dilated. Wall thickness was increased in a pattern of  moderate LVH.  Systolic function was mildly reduced. The  estimated  ejection fraction was in the range of 45% to 50%. Wall motion was  normal; there were no regional wall motion abnormalities. Left  ventricular diastolic function parameters were normal. - Aortic valve: Severely calcified non coronary cusp. - Mitral valve: Calcified annulus. Mildly thickened leaflets . - Atrial septum: No defect or patent foramen ovale was identified.  Carotid Dopplers 08/02/15 Summary:  - The vertebral arteries appear patent with antegrade flow. - Findings consistent with 1-39 percent stenosis involving the  right internal carotid artery and the left internal carotid  artery.  Vascular US Lower Extremity Saphenous Vein Mapping 08/02/15    Anti-Infectives:   Vancomycin 07/23/15---> 07/27/15 Cefuroxime 07/23/15 --->07/27/15 Rocephin 07/27/15--->  Subjective:   Cory Weber feels well, still with some foot pain. No dyspnea or chest pain. No nausea or vomiting. Appetite is good.  Objective:    Filed Vitals:   08/05/15 1423 08/05/15 2158 08/06/15 0538 08/06/15 1350  BP: 149/76 174/92 148/74 159/79  Pulse: 83 89 77 94  Temp: 98.7 F (37.1 C) 98.6 F (37 C) 98.8 F (37.1 C) 98.2 F (36.8 C)  TempSrc: Oral Oral Oral Oral  Resp: 18 18 16 18   Height:      Weight:      SpO2: 97% 99% 95% 95%    Intake/Output Summary (Last 24 hours) at 08/06/15 1408 Last data filed at 08/06/15 0937  Gross per 24 hour  Intake    222 ml  Output   2100 ml  Net  -1878 ml   Filed Weights   07/23/15 1453  Weight: 90.266 kg (199 lb)    Exam: General exam: Appears calm and comfortable.  Respiratory system: Clear to auscultation. Respiratory effort normal. Cardiovascular system: S1 & S2 heard, RRR. No JVD,  rubs, gallops or clicks. No murmurs. Gastrointestinal system: Abdomen is nondistended, soft and nontender. No organomegaly or masses felt. Normal bowel sounds heard. Central nervous system: Alert and oriented. No focal neurological deficits. Extremities:  Left foot as pictured below.Dressings intact and not examined today. Skin: No rashes, lesions or ulcers except as noted on left foot below. Psychiatry: Judgement and insight appear normal. Mood & affect appropriate.      Data Reviewed:   I have personally reviewed following labs and imaging studies:  Labs: Basic Metabolic Panel:  Recent Labs Lab 08/02/15 0330 08/03/15 0250 08/04/15 0259 08/05/15 0339 08/06/15 0258  NA 137 136 136 137 136  K 4.3 3.9 4.0 4.3 4.4  CL 102 101 102 101 101  CO2 29 29 28 30 28   GLUCOSE 162* 170* 122* 110* 147*  BUN 11 12 10 11 12   CREATININE 1.67* 1.66* 1.63* 1.59* 1.64*  CALCIUM 8.8* 8.6* 8.8* 9.2 9.0  PHOS 4.2 4.0 4.3 3.9 4.3   GFR Estimated Creatinine Clearance: 52.8 mL/min (by C-G formula based on Cr of 1.64). Liver Function Tests:  Recent Labs Lab 08/02/15 0330 08/03/15 0250 08/04/15 0259 08/05/15 0339 08/06/15 0258  ALBUMIN 2.0* 2.0* 2.2* 2.5* 2.4*   No results for input(s): AMMONIA in the last 168 hours. CBC:  Recent Labs Lab 08/01/15 0307 08/02/15 0345  WBC 11.2* 11.7*  HGB 7.3* 8.0*  HCT 23.7* 25.8*  MCV 87.5 86.6  PLT 573* 644*   CBG:  Recent Labs Lab 08/05/15 1104 08/05/15 1624 08/05/15 2156 08/06/15 0541 08/06/15 1209  GLUCAP 170* 166* 148* 173* 188*   Microbiology Recent Results (from the past 240 hour(s))  Surgical pcr screen  Status: Abnormal   Collection Time: 07/31/15  9:51 PM  Result Value Ref Range Status   MRSA, PCR NEGATIVE NEGATIVE Final   Staphylococcus aureus POSITIVE (A) NEGATIVE Final    Comment:        The Xpert SA Assay (FDA approved for NASAL specimens in patients over 83 years of age), is one component of a comprehensive surveillance program.  Test performance has been validated by [facility] for patients greater than or equal to 3 year old. It is not intended to diagnose infection nor to guide or monitor treatment.     Radiology: No results found.  Medications:     . sodium chloride   Intravenous Once  . carvedilol  3.125 mg Oral BID WC  . cefTRIAXone (ROCEPHIN)  IV  1 g Intravenous Q24H  . Chlorhexidine Gluconate Cloth  6 each Topical Daily  . docusate sodium  100 mg Oral Daily  . famotidine  20 mg Oral Daily  . folic acid  1 mg Oral Daily  . gabapentin  300 mg Oral QHS  . heparin subcutaneous  5,000 Units Subcutaneous Q8H  . insulin aspart  0-5 Units Subcutaneous QHS  . insulin aspart  0-9 Units Subcutaneous TID WC  . insulin aspart  3 Units Subcutaneous TID WC  . insulin glargine  10 Units Subcutaneous Daily  . multivitamin with minerals  1 tablet Oral Daily  . mupirocin ointment  1 application Nasal BID  . polyethylene glycol  17 g Oral Daily  . silver sulfADIAZINE   Topical Daily  . simvastatin  20 mg Oral q1800  . tamsulosin  0.4 mg Oral Daily  . thiamine  100 mg Oral Daily   Continuous Infusions: . sodium chloride Stopped (08/01/15 1827)    Time spent: 25 minutes.   LOS: 14 days   [facility] Pager [phone]. If unable to reach me by pager, please call my cell phone at [phone].  *Please refer to [email or web address], password [email or web address] to get updated schedule on who will round on this patient, as hospitalists switch teams weekly. If 7PM-7AM, please contact night-coverage at [email or web address], password [email or web address] for any overnight needs.  08/06/2015, 2:08 PM

## 2015-08-07 LAB — GLUCOSE, CAPILLARY
GLUCOSE-CAPILLARY: 152 mg/dL — AB (ref 65–99)
Glucose-Capillary: 279 mg/dL — ABNORMAL HIGH (ref 65–99)
Glucose-Capillary: 84 mg/dL (ref 65–99)
Glucose-Capillary: 176 mg/dL — ABNORMAL HIGH (ref 65–99)

## 2015-08-07 LAB — BASIC METABOLIC PANEL WITH GFR
Anion gap: 8 (ref 5–15)
BUN: 13 mg/dL (ref 6–20)
CO2: 29 mmol/L (ref 22–32)
Calcium: 9.5 mg/dL (ref 8.9–10.3)
Chloride: 99 mmol/L — ABNORMAL LOW (ref 101–111)
Creatinine, Ser: 1.54 mg/dL — ABNORMAL HIGH (ref 0.61–1.24)
GFR calc Af Amer: 54 mL/min — ABNORMAL LOW
GFR calc non Af Amer: 47 mL/min — ABNORMAL LOW
Glucose, Bld: 213 mg/dL — ABNORMAL HIGH (ref 65–99)
Potassium: 4.6 mmol/L (ref 3.5–5.1)
Sodium: 136 mmol/L (ref 135–145)

## 2015-08-07 LAB — CBC
HCT: 27.2 % — ABNORMAL LOW (ref 39.0–52.0)
Hemoglobin: 8.3 g/dL — ABNORMAL LOW (ref 13.0–17.0)
MCH: 27.1 pg (ref 26.0–34.0)
MCHC: 30.5 g/dL (ref 30.0–36.0)
MCV: 88.9 fL (ref 78.0–100.0)
Platelets: 550 K/uL — ABNORMAL HIGH (ref 150–400)
RBC: 3.06 MIL/uL — ABNORMAL LOW (ref 4.22–5.81)
RDW: 14.3 % (ref 11.5–15.5)
WBC: 11.1 K/uL — ABNORMAL HIGH (ref 4.0–10.5)

## 2015-08-07 MED ORDER — INSULIN ASPART 100 UNIT/ML ~~LOC~~ SOLN
4.0000 [IU] | Freq: Three times a day (TID) | SUBCUTANEOUS | Status: DC
  Administered 2015-08-07 – 2015-08-10 (×6): 4 [IU] via SUBCUTANEOUS

## 2015-08-07 NOTE — Progress Notes (Signed)
Progress Note    JESSEN HAILU  [id number] DOB: 04-29-1950  DOA: 07/23/2015 PCP: Maggie Font, MD    Brief Narrative:   Cory Weber is an 65 y.o. male with past medical history significant for diabetes mellitus type 2, hypertension, hyperlipidemia and tobacco abuse who was recently admitted for left foot cellulitis after patient had immersed his foot in hot water (but due to his neuropathy he did not realize how hot the water was). At that time patient had blister on his right great toe which gone down but developed wound around the great toe. Patient was admitted and placed on antibiotics and vascular surgeon was consulted and ABI was 0.4. Patient was referred to Vascular surgeon as outpatient with Dr. Bridgett Larsson. Patient states his pain worsened last 2-3 days after discharge with new blisters forming on the dorsal aspect of his left foot around the lateral toes. There is also gangrenous appearing left toe. Patient is mildly tachycardic and has leukocytosis. On-call vascular surgeon Dr. Trula Slade at [name] was consulted by the ER physician. Patient transferred to [facility] for further management of his cellulitis and peripheral vascular disease. Patient also has anemia and during last admission was noted to have stool for occult blood positive. Patient was taking NSAIDs and was instructed to stop and was advised further GI workup as outpatient.  Assessment/Plan:   Principal Problem:   Cellulitis L foot Complicated by peripheral vascular disease Initially placed on vancomycin and Zosyn, antibiotics subsequently changed to Rocephin. Underwent angiogram which showed popliteal occlusion. Vascular surgery following with consideration for femoral-posterior tibial bypass. Underwent carotid Dopplers and vein mapping 08/02/15 in anticipation of surgery. No significant ICA stenosis noted. Cardiology consulted for cardiac clearance preoperatively. 2-D Echo showed EF 45-50% with no regional wall  motion abnormalities, with prior Echo done 06/25/13 showing EF of 50-55%.  No dramatic change. Considered low risk. Awaiting surgery, which is scheduled for 08/08/15.  Active Problems:   Essential hypertension Lasix and HCTZ on hold. Low-dose Coreg started by cardiology. Resume ACE inhibitor postoperatively if renal function remains stable.    Type 2 diabetes mellitus with vascular disease (HCC) Oral hypoglycemics on hold. Currently being managed with insulin sensitive SSI /3 units meal coverage and 10 units of Lantus daily. CBGs 120-189. Increase meal coverage to 4 units.    Normocytic normochromic anemia Was Hemoccult positive on recent admission. Patient was instructed to stop taking NSAIDs. Continue Pepcid. Received 1 unit of PRBCs. Monitor hemoglobin periodically. Results pending for today.    Hyperlipidemia Continue statin.    Tobacco abuse Tobacco cessation counseling requested.    History of hepatitis C with possible cirrhosis Status post antiviral treatment for hepatitis C.    Stage III chronic kidney disease/urinary retention Patient was started on Flomax and now has a Foley in place. Status post renal ultrasound showed medical renal disease. Status post nephrology evaluation.    Acute encephalopathy Pain medications discontinued and dose of Neurontin decreased with improvement in mental status.  Family Communication/Anticipated D/C date and plan/Code Status   DVT prophylaxis: Heparin ordered. Code Status: Full Code.  Family Communication: Cory Weber [phone] (daughter) is emergency contact. Updated by telephone 08/04/15. Visitor at the bedside today. Disposition Plan: Likely home when surgery completed and patient stable.   Medical Consultants:    Cardiology  Vascular Surgery  Nephrology   Procedures:   2 D Echo 08/03/15 Study Conclusions  - Left ventricle: Septal and apical hypokinesis. The cavity size  was  mildly dilated. Wall thickness was  increased in a pattern of  moderate LVH. Systolic function was mildly reduced. The estimated  ejection fraction was in the range of 45% to 50%. Wall motion was  normal; there were no regional wall motion abnormalities. Left  ventricular diastolic function parameters were normal. - Aortic valve: Severely calcified non coronary cusp. - Mitral valve: Calcified annulus. Mildly thickened leaflets . - Atrial septum: No defect or patent foramen ovale was identified.  Carotid Dopplers 08/02/15 Summary:  - The vertebral arteries appear patent with antegrade flow. - Findings consistent with 1-39 percent stenosis involving the  right internal carotid artery and the left internal carotid  artery.  Vascular US Lower Extremity Saphenous Vein Mapping 08/02/15    Anti-Infectives:   Vancomycin 07/23/15---> 07/27/15 Cefuroxime 07/23/15 --->07/27/15 Rocephin 07/27/15--->  Subjective:   Cory Weber feels well, still with some foot pain. No dyspnea or chest pain. No nausea or vomiting. Appetite is good.  Objective:    Filed Vitals:   08/06/15 2122 08/06/15 2124 08/06/15 2327 08/07/15 0615  BP: 186/103 165/102 146/86 154/77  Pulse: 93  94 102  Temp: 98.9 F (37.2 C)   98.1 F (36.7 C)  TempSrc: Oral   Oral  Resp: 16   18  Height:      Weight:      SpO2: 99%   100%    Intake/Output Summary (Last 24 hours) at 08/07/15 0728 Last data filed at 08/07/15 0615  Gross per 24 hour  Intake      0 ml  Output   2550 ml  Net  -2550 ml   Filed Weights   07/23/15 1453  Weight: 90.266 kg (199 lb)    Exam: General exam: Appears calm and comfortable.  Respiratory system: Clear to auscultation. Respiratory effort normal. Cardiovascular system: S1 & S2 heard, RRR. No JVD,  rubs, gallops or clicks. No murmurs. Gastrointestinal system: Abdomen is nondistended, soft and nontender. No organomegaly or masses felt. Normal bowel sounds heard. Central nervous system: Alert and oriented. No focal  neurological deficits. Extremities: Left foot as pictured below.Dressings intact and not examined today. Skin: No rashes, lesions or ulcers except as noted on left foot below. Psychiatry: Judgement and insight appear normal. Mood & affect appropriate.      Data Reviewed:   I have personally reviewed following labs and imaging studies:  Labs: Basic Metabolic Panel:  Recent Labs Lab 08/02/15 0330 08/03/15 0250 08/04/15 0259 08/05/15 0339 08/06/15 0258  NA 137 136 136 137 136  K 4.3 3.9 4.0 4.3 4.4  CL 102 101 102 101 101  CO2 29 29 28 30 28   GLUCOSE 162* 170* 122* 110* 147*  BUN 11 12 10 11 12   CREATININE 1.67* 1.66* 1.63* 1.59* 1.64*  CALCIUM 8.8* 8.6* 8.8* 9.2 9.0  PHOS 4.2 4.0 4.3 3.9 4.3   GFR Estimated Creatinine Clearance: 52.8 mL/min (by C-G formula based on Cr of 1.64). Liver Function Tests:  Recent Labs Lab 08/02/15 0330 08/03/15 0250 08/04/15 0259 08/05/15 0339 08/06/15 0258  ALBUMIN 2.0* 2.0* 2.2* 2.5* 2.4*   No results for input(s): AMMONIA in the last 168 hours. CBC:  Recent Labs Lab 08/01/15 0307 08/02/15 0345  WBC 11.2* 11.7*  HGB 7.3* 8.0*  HCT 23.7* 25.8*  MCV 87.5 86.6  PLT 573* 644*   CBG:  Recent Labs Lab 08/06/15 0541 08/06/15 1209 08/06/15 1624 08/06/15 2120 08/07/15 0613  GLUCAP 173* 188* 120* 189* 152*   Microbiology Recent Results (from  the past 240 hour(s))  Surgical pcr screen     Status: Abnormal   Collection Time: 07/31/15  9:51 PM  Result Value Ref Range Status   MRSA, PCR NEGATIVE NEGATIVE Final   Staphylococcus aureus POSITIVE (A) NEGATIVE Final    Comment:        The Xpert SA Assay (FDA approved for NASAL specimens in patients over 56 years of age), is one component of a comprehensive surveillance program.  Test performance has been validated by [facility] for patients greater than or equal to 60 year old. It is not intended to diagnose infection nor to guide or monitor treatment.      Radiology: No results found.  Medications:   . sodium chloride   Intravenous Once  . carvedilol  3.125 mg Oral BID WC  . cefTRIAXone (ROCEPHIN)  IV  1 g Intravenous Q24H  . Chlorhexidine Gluconate Cloth  6 each Topical Daily  . docusate sodium  100 mg Oral Daily  . famotidine  20 mg Oral Daily  . folic acid  1 mg Oral Daily  . gabapentin  300 mg Oral QHS  . heparin subcutaneous  5,000 Units Subcutaneous Q8H  . insulin aspart  0-5 Units Subcutaneous QHS  . insulin aspart  0-9 Units Subcutaneous TID WC  . insulin aspart  3 Units Subcutaneous TID WC  . insulin glargine  10 Units Subcutaneous Daily  . multivitamin with minerals  1 tablet Oral Daily  . mupirocin ointment  1 application Nasal BID  . polyethylene glycol  17 g Oral Daily  . silver sulfADIAZINE   Topical Daily  . simvastatin  20 mg Oral q1800  . tamsulosin  0.4 mg Oral Daily  . thiamine  100 mg Oral Daily   Continuous Infusions: . sodium chloride Stopped (08/01/15 1827)    Time spent: 25 minutes.   LOS: 15 days   [facility] Pager [phone]. If unable to reach me by pager, please call my cell phone at [phone].  *Please refer to [email or web address], password [email or web address] to get updated schedule on who will round on this patient, as hospitalists switch teams weekly. If 7PM-7AM, please contact night-coverage at [email or web address], password [email or web address] for any overnight needs.  08/07/2015, 7:28 AM

## 2015-08-07 NOTE — Progress Notes (Signed)
Physical Therapy Treatment Patient Details Name: Cory Weber MRN: [id number] DOB: 08-09-1950 Today's Date: 08/07/2015    History of Present Illness Patient is a 65 y/o male admitted with L foot infection following injury (dropping frozen chicken on his foot,) and immersing in hot water with burns.  Now s/p arteriogram.  PMH positive for HTN, HLD, DM, tobacco abuse and Hep C.    PT Comments    Patient progressing with independence despite safety concerns for mobilizing in his room unaided.  Feel he may progress to go home without SNF stay, but will keep current recommendations due to may need more assist following revascularization procedure tomorrow.   Follow Up Recommendations  No PT follow up     Equipment Recommendations  None recommended by PT    Recommendations for Other Services       Precautions / Restrictions Precautions Precautions: Fall    Mobility  Bed Mobility Overal bed mobility: Modified Independent                Transfers Overall transfer level: Modified independent               General transfer comment: up in room prior to my arrival, walking to bathroom with walker  Ambulation/Gait Ambulation/Gait assistance: Supervision Ambulation Distance (Feet): 12 Feet (x 2) Assistive device: Rolling walker (2 wheeled) Gait Pattern/deviations: Step-to pattern;Decreased stride length;Antalgic     General Gait Details: placing foot flat now on floor and tolerating imiproved weight bearing, but still somwhat antalgic.  Limited ambulation this session due to patient standing at sink to shave for 15 minutes   Stairs            Wheelchair Mobility    Modified Rankin (Stroke Patients Only)       Balance Overall balance assessment: Needs assistance         Standing balance support: No upper extremity supported Standing balance-Leahy Scale: Fair Standing balance comment: stood at sink to shave for about 15 minutes intermittent periods  of no UE support, but stayed flexed and most of weight on R foot.  SBA for safety                    Cognition Arousal/Alertness: Awake/alert Behavior During Therapy: WFL for tasks assessed/performed Overall Cognitive Status: Within Functional Limits for tasks assessed                      Exercises      General Comments General comments (skin integrity, edema, etc.): Patient educated to call for assist prior to standing in room, and not to shave with straight edge, but not ammenable to education so SBA provided for safety for balance as well as if had any bleeding.        Pertinent Vitals/Pain Faces Pain Scale: Hurts even more Pain Location: L foot with weight bearing Pain Descriptors / Indicators: Guarding Pain Intervention(s): Monitored during session;Limited activity within patient's tolerance    Home Living                      Prior Function            PT Goals (current goals can now be found in the care plan section) Progress towards PT goals: Progressing toward goals    Frequency  Min 3X/week    PT Plan Current plan remains appropriate    Co-evaluation  End of Session   Activity Tolerance: Patient tolerated treatment well Patient left: in bed;with call bell/phone within reach     Time: 1210-1245 PT Time Calculation (min) (ACUTE ONLY): 35 min  Charges:  $Gait Training: 8-22 mins $Therapeutic Activity: 8-22 mins                    G Codes:      Reginia Naas 08/19/15, 1:29 PM  Magda Kiel, [place] 19-Aug-2015

## 2015-08-08 ENCOUNTER — Encounter (HOSPITAL_COMMUNITY)
Admission: EM | Disposition: A | Discharge status: Home / Self Care | Payer: Self-pay | Source: Home / Self Care | Attending: Internal Medicine

## 2015-08-08 ENCOUNTER — Encounter (HOSPITAL_COMMUNITY): Payer: Self-pay | Admitting: Surgery

## 2015-08-08 DIAGNOSIS — I70244 Atherosclerosis of native arteries of left leg with ulceration of heel and midfoot: Secondary | ICD-10-CM

## 2015-08-08 HISTORY — PX: PERIPHERAL VASCULAR CATHETERIZATION: SHX172C

## 2015-08-08 LAB — POCT ACTIVATED CLOTTING TIME
Activated Clotting Time: 235 seconds
Activated Clotting Time: 219 seconds
Activated Clotting Time: 186 seconds
Activated Clotting Time: 180 seconds

## 2015-08-08 LAB — BASIC METABOLIC PANEL
Anion gap: 8 (ref 5–15)
BUN: 15 mg/dL (ref 6–20)
CHLORIDE: 99 mmol/L — AB (ref 101–111)
CO2: 29 mmol/L (ref 22–32)
Calcium: 9.5 mg/dL (ref 8.9–10.3)
Creatinine, Ser: 1.57 mg/dL — ABNORMAL HIGH (ref 0.61–1.24)
GFR calc non Af Amer: 46 mL/min — ABNORMAL LOW (ref >60)
GFR, EST AFRICAN AMERICAN: 53 mL/min — AB (ref >60)
Glucose, Bld: 153 mg/dL — ABNORMAL HIGH (ref 65–99)
POTASSIUM: 4.7 mmol/L (ref 3.5–5.1)
SODIUM: 136 mmol/L (ref 135–145)

## 2015-08-08 LAB — GLUCOSE, CAPILLARY
GLUCOSE-CAPILLARY: 295 mg/dL — AB (ref 65–99)
Glucose-Capillary: 152 mg/dL — ABNORMAL HIGH (ref 65–99)
Glucose-Capillary: 119 mg/dL — ABNORMAL HIGH (ref 65–99)
Glucose-Capillary: 149 mg/dL — ABNORMAL HIGH (ref 65–99)

## 2015-08-08 LAB — PROTIME-INR
INR: 1.35 (ref 0.00–1.49)
Prothrombin Time: 16.8 seconds — ABNORMAL HIGH (ref 11.6–15.2)

## 2015-08-08 LAB — CBC
HEMATOCRIT: 27.1 % — AB (ref 39.0–52.0)
Hemoglobin: 7.9 g/dL — ABNORMAL LOW (ref 13.0–17.0)
MCH: 25.4 pg — ABNORMAL LOW (ref 26.0–34.0)
MCHC: 29.2 g/dL — AB (ref 30.0–36.0)
MCV: 87.1 fL (ref 78.0–100.0)
Platelets: 573 10*3/uL — ABNORMAL HIGH (ref 150–400)
RBC: 3.11 MIL/uL — ABNORMAL LOW (ref 4.22–5.81)
RDW: 14.3 % (ref 11.5–15.5)
WBC: 10.3 10*3/uL (ref 4.0–10.5)

## 2015-08-08 SURGERY — ABDOMINAL AORTOGRAM W/LOWER EXTREMITY
Anesthesia: LOCAL

## 2015-08-08 MED ORDER — FENTANYL CITRATE (PF) 100 MCG/2ML IJ SOLN
INTRAMUSCULAR | Status: DC | PRN
  Administered 2015-08-08: 25 ug via INTRAVENOUS

## 2015-08-08 MED ORDER — HEPARIN SODIUM (PORCINE) 1000 UNIT/ML IJ SOLN
INTRAMUSCULAR | Status: DC | PRN
  Administered 2015-08-08: 9000 [IU] via INTRAVENOUS

## 2015-08-08 MED ORDER — MIDAZOLAM HCL 2 MG/2ML IJ SOLN
INTRAMUSCULAR | Status: AC
  Filled 2015-08-08: qty 2

## 2015-08-08 MED ORDER — HEPARIN SODIUM (PORCINE) 1000 UNIT/ML IJ SOLN
INTRAMUSCULAR | Status: AC
  Filled 2015-08-08: qty 1

## 2015-08-08 MED ORDER — LIDOCAINE HCL (PF) 1 % IJ SOLN
INTRAMUSCULAR | Status: AC
  Filled 2015-08-08: qty 30

## 2015-08-08 MED ORDER — LABETALOL HCL 5 MG/ML IV SOLN
INTRAVENOUS | Status: AC
  Filled 2015-08-08: qty 4

## 2015-08-08 MED ORDER — IODIXANOL 320 MG/ML IV SOLN
INTRAVENOUS | Status: DC | PRN
  Administered 2015-08-08: 45 mL via INTRA_ARTERIAL

## 2015-08-08 MED ORDER — LABETALOL HCL 5 MG/ML IV SOLN
10.0000 mg | INTRAVENOUS | Status: DC | PRN
  Administered 2015-08-08 (×2): 10 mg via INTRAVENOUS

## 2015-08-08 MED ORDER — MIDAZOLAM HCL 2 MG/2ML IJ SOLN
INTRAMUSCULAR | Status: DC | PRN
  Administered 2015-08-08: 2 mg via INTRAVENOUS

## 2015-08-08 MED ORDER — HEPARIN (PORCINE) IN NACL 2-0.9 UNIT/ML-% IJ SOLN
INTRAMUSCULAR | Status: AC
  Filled 2015-08-08: qty 1000

## 2015-08-08 MED ORDER — SODIUM CHLORIDE 0.9 % IV SOLN
INTRAVENOUS | Status: DC
  Administered 2015-08-08: 20:00:00 via INTRAVENOUS

## 2015-08-08 MED ORDER — LIDOCAINE HCL (PF) 1 % IJ SOLN
INTRAMUSCULAR | Status: DC | PRN
  Administered 2015-08-08: 20 mL via SUBCUTANEOUS

## 2015-08-08 MED ORDER — HEPARIN (PORCINE) IN NACL 2-0.9 UNIT/ML-% IJ SOLN
INTRAMUSCULAR | Status: DC | PRN
  Administered 2015-08-08: 1000 mL via INTRA_ARTERIAL

## 2015-08-08 MED ORDER — SODIUM CHLORIDE 0.9 % IV SOLN
INTRAVENOUS | Status: DC
Start: 2015-08-08 — End: 2015-08-10
  Administered 2015-08-08: 05:00:00 via INTRAVENOUS

## 2015-08-08 MED ORDER — FENTANYL CITRATE (PF) 100 MCG/2ML IJ SOLN
INTRAMUSCULAR | Status: AC
  Filled 2015-08-08: qty 2

## 2015-08-08 SURGICAL SUPPLY — 20 items
BALLN COYOTE ES OTW 3X40X145 (BALLOONS) ×3
BALLN COYOTE OTW 3X100X150 (BALLOONS) ×3
BALLN STERLING OTW 5X100X135 (BALLOONS) ×3
BALLOON COYOTE ES OTW 3X40X145 (BALLOONS) ×2 IMPLANT
BALLOON COYOTE OTW 3X100X150 (BALLOONS) ×2 IMPLANT
BALLOON STERLING OTW 5X100X135 (BALLOONS) ×2 IMPLANT
COVER PRB 48X5XTLSCP FOLD TPE (BAG) ×2 IMPLANT
COVER PROBE 5X48 (BAG) ×2
KIT ENCORE 26 ADVANTAGE (KITS) ×3 IMPLANT
KIT MICROINTRODUCER STIFF 5F (SHEATH) ×3 IMPLANT
KIT PV (KITS) ×3 IMPLANT
SHEATH PINNACLE 5F 10CM (SHEATH) ×3 IMPLANT
SHEATH PINNACLE ST 6F 45CM (SHEATH) ×3 IMPLANT
SHIELD RADPAD SCOOP 12X17 (MISCELLANEOUS) ×6 IMPLANT
SYR MEDRAD MARK V 150ML (SYRINGE) ×3 IMPLANT
TAPE RADIOPAQUE TURBO (MISCELLANEOUS) ×3 IMPLANT
TRANSDUCER W/STOPCOCK (MISCELLANEOUS) ×3 IMPLANT
TRAY PV CATH (CUSTOM PROCEDURE TRAY) ×3 IMPLANT
WIRE APROACH 18G .014X300CM (WIRE) ×3 IMPLANT
WIRE BENTSON .035X145CM (WIRE) ×3 IMPLANT

## 2015-08-08 NOTE — H&P (View-Only) (Signed)
Plan for attempt at percutaneous revascularization on Tuesday Morning.  NPO after midnight on Monday   Cory Weber

## 2015-08-08 NOTE — Progress Notes (Signed)
Site area: Left  Groin a 6 french arterial sheath was removed  Site Prior to Removal:  Level 0  Pressure Applied For 15 MINUTES    Bedrest  Beginning at 1120am  Manual:   Yes.    Patient Status During Pull:  stable  Post Pull Groin Site:  Level 0  Post Pull Instructions Given:  Yes.    Post Pull Pulses Present:  Yes.    Dressing Applied:  Yes.    Comments: VS remain stable .  Medication effective for BP

## 2015-08-08 NOTE — Progress Notes (Signed)
Utilization review completed.  

## 2015-08-08 NOTE — Op Note (Signed)
Patient name: Cory Weber MRN: [id number] DOB: 11-Oct-1950 Sex: male  07/23/2015 - 08/08/2015 Pre-operative Diagnosis: Left leg ulcer Post-operative diagnosis:  Same Surgeon:  Annamarie Major Procedure Performed:  1.  Antegrade ultrasound guided access left common femoral artery  2.  Balloon angioplasty, left popliteal artery  3.  Balloon angioplasty, left tibioperoneal trunk  4.  Failed balloon angioplasty, left posterior tibial artery  5.  Conscious sedation (7:25-8:32)   Indications:  The patient initially presented with a left foot ulcer.  He is also found to be in acute renal failure.  He underwent diagnostic imaging last week.  I was initially planning surgical revascularization, however his vein mapping showed a suboptimal saphenous vein.  Therefore I elected to bring him back for a attempt at percutaneous revascularization  Procedure:  The patient was identified in the holding area and taken to room 8.  The patient was then placed supine on the table and prepped and draped in the usual sterile fashion.  A time out was called.  Conscious sedation was performed with the use of IV fentanyl and Versed under continuous position and circulating nurse monitoring who were present for the entire procedure.  Heart rate, blood pressure, and oxygen saturations were continuously monitored.  Ultrasound was used to evaluate the left common femoral artery.  It was patent .  A digital ultrasound image was acquired.  A micropuncture needle was used to access the left common femoral artery under ultrasound guidance in a antegrade fashion..  An 018 wire was advanced without resistance and a micropuncture sheath was placed.  The 018 wire was removed and a benson wire was placed.  The micropuncture sheath was exchanged for a 5 french sheath.  The 5 French sheath was upsized to a 6 [place] 45 cm sheath.  The patient was fully heparinized.  I used a 02/15/2016 gram wire with the support of a 3 x 40 [place] 014  balloon to successfully cross the total occlusion of the popliteal artery.  I then navigated the wire into the tibioperoneal trunk.  I had trouble advancing the wire at this point and elected to perform balloon angioplasty of the tibioperoneal trunk and popliteal artery to get better visualization.  This was done with separate inflations of the 3 mm balloon holding it up for 2 minutes.  Follow-up imaging showed patency of the popliteal occlusion and runoff via the peroneal artery.  There is reconstitution of the posterior tibial artery which crossed the ankle.  At this point I used a 5 mm balloon to perform balloon and plasty of the popliteal artery.  This was held for 2 minutes.  Follow-up imaging revealed excellent results across this area with complete resolution of the stenosis.  This also good flow through the tibioperoneal trunk and into the peroneal artery.  The dominant runoff vessel across the ankle with the posterior tibial artery.  Therefore, I reinserted the 3 mm balloon and tried to navigate the wire through the occlusion of the posterior tibial artery.  I was unable to do so.  Because of the contrast artery administered, I elected to terminate the procedure at this point having felt that we had significantly improved blood flow.  Catheters and wires were removed.  The patient taken the holding area for sheath pull once his coagulation profile Corrects.   Impression:  #1  successful recanalization of an occluded left popliteal artery with subsequent balloon angioplasty using a 5 mm balloon with resolution of the stenosis.  #2  successful balloon angioplasty of a diseased 70% tibioperoneal trunk stenosis with residual stenosis less than 10%.  This was with a 3 mm balloon.  #3  unsuccessful attempt at recanalization of an occluded posterior tibial artery  #4  single-vessel runoff via the peroneal which reconstitutes a posterior tibial  #5  45 cc contrast utilization   V. Annamarie Major,  M.D. [facility] Office: [phone] Pager:  [phone]

## 2015-08-08 NOTE — Interval H&P Note (Signed)
History and Physical Interval Note:  08/08/2015 7:23 AM  Cory Weber  has presented today for surgery, with the diagnosis of NON HEALING WOUND TO LEFT FOOT  The various methods of treatment have been discussed with the patient and family. After consideration of risks, benefits and other options for treatment, the patient has consented to  Procedure(s): Abdominal Aortogram w/Lower Extremity (N/A) as a surgical intervention .  The patient's history has been reviewed, patient examined, no change in status, stable for surgery.  I have reviewed the patient's chart and labs.  Questions were answered to the patient's satisfaction.     Annamarie Major

## 2015-08-08 NOTE — Progress Notes (Signed)
Progress Note    Cory Weber  [id number] DOB: 1950/05/27  DOA: 07/23/2015 PCP: Maggie Font, MD    Brief Narrative:   Cory Weber is an 65 y.o. male with past medical history significant for diabetes mellitus type 2, hypertension, hyperlipidemia and tobacco abuse who was recently admitted for left foot cellulitis after patient had immersed his foot in hot water (but due to his neuropathy he did not realize how hot the water was). At that time patient had blister on his right great toe which gone down but developed wound around the great toe. Patient was admitted and placed on antibiotics and vascular surgeon was consulted and ABI was 0.4. Patient was referred to Vascular surgeon as outpatient with Dr. Bridgett Larsson. Patient states his pain worsened last 2-3 days after discharge with new blisters forming on the dorsal aspect of his left foot around the lateral toes. There is also gangrenous appearing left toe. Patient is mildly tachycardic and has leukocytosis. On-call vascular surgeon Dr. Trula Slade at [name] was consulted by the ER physician. Patient transferred to [facility] for further management of his cellulitis and peripheral vascular disease. Patient also has anemia and during last admission was noted to have stool for occult blood positive. Patient was taking NSAIDs and was instructed to stop and was advised further GI workup as outpatient.  Assessment/Plan:   Principal Problem:   Cellulitis L foot Complicated by peripheral vascular disease Initially placed on vancomycin and Zosyn, antibiotics subsequently changed to Rocephin. Underwent angiogram which showed popliteal occlusion. Vascular surgery following with consideration for femoral-posterior tibial bypass. Underwent carotid Dopplers and vein mapping 08/02/15 in anticipation of surgery. No significant ICA stenosis noted. Cardiology consulted for cardiac clearance preoperatively. 2-D Echo showed EF 45-50% with no regional wall  motion abnormalities, with prior Echo done 06/25/13 showing EF of 50-55%.  No dramatic change. For surgery today with hopes of revascularization.  Active Problems:   Essential hypertension Lasix and HCTZ on hold. Low-dose Coreg started by cardiology. Resume ACE inhibitor postoperatively if renal function remains stable.    Type 2 diabetes mellitus with vascular disease (HCC) Oral hypoglycemics on hold. Currently being managed with insulin sensitive SSI /4 units meal coverage and 10 units of Lantus daily. CBGs 84-295.    Normocytic normochromic anemia Was Hemoccult positive on recent admission. Patient was instructed to stop taking NSAIDs. Continue Pepcid. Received 1 unit of PRBCs. Monitor hemoglobin periodically.     Hyperlipidemia Continue statin.    Tobacco abuse Tobacco cessation counseling requested.    History of hepatitis C with possible cirrhosis Status post antiviral treatment for hepatitis C.    Stage III chronic kidney disease/urinary retention Patient was started on Flomax and now has a Foley in place. Status post renal ultrasound showed medical renal disease. Status post nephrology evaluation.    Acute encephalopathy Pain medications discontinued and dose of Neurontin decreased with improvement in mental status.  Family Communication/Anticipated D/C date and plan/Code Status   DVT prophylaxis: Heparin ordered. Code Status: Full Code.  Family Communication: [name] [phone] (daughter) is emergency contact. Updated by telephone 08/04/15. Visitor at the bedside today. Disposition Plan: Likely home when surgery completed and patient stable.   Medical Consultants:    Cardiology  Vascular Surgery  Nephrology   Procedures:   2 D Echo 08/03/15 Study Conclusions  - Left ventricle: Septal and apical hypokinesis. The cavity size  was mildly dilated. Wall thickness was increased in a pattern of  moderate  LVH. Systolic function was mildly reduced. The  estimated  ejection fraction was in the range of 45% to 50%. Wall motion was  normal; there were no regional wall motion abnormalities. Left  ventricular diastolic function parameters were normal. - Aortic valve: Severely calcified non coronary cusp. - Mitral valve: Calcified annulus. Mildly thickened leaflets . - Atrial septum: No defect or patent foramen ovale was identified.  Carotid Dopplers 08/02/15 Summary:  - The vertebral arteries appear patent with antegrade flow. - Findings consistent with 1-39 percent stenosis involving the  right internal carotid artery and the left internal carotid  artery.  Vascular US Lower Extremity Saphenous Vein Mapping 08/02/15    Anti-Infectives:   Vancomycin 07/23/15---> 07/27/15 Cefuroxime 07/23/15 --->07/27/15 Rocephin 07/27/15--->  Subjective:   Cory Weber is anticipating surgery. No dyspnea or chest pain. No nausea or vomiting. Still with some foot soreness.  Objective:    Filed Vitals:   08/08/15 1115 08/08/15 1120 08/08/15 1140 08/08/15 1339  BP: 163/80 156/81 153/84 143/83  Pulse: 80 86 84 91  Temp:   98.5 F (36.9 C) 98.1 F (36.7 C)  TempSrc:   Oral Oral  Resp: 18 17 16 18   Height:      Weight:      SpO2: 97% 100% 99% 100%    Intake/Output Summary (Last 24 hours) at 08/08/15 1907 Last data filed at 08/08/15 1300  Gross per 24 hour  Intake    600 ml  Output   2950 ml  Net  -2350 ml   Filed Weights   07/23/15 1453  Weight: 90.266 kg (199 lb)    Exam: General exam: Appears calm and comfortable.  Respiratory system: Clear to auscultation. Respiratory effort normal. Cardiovascular system: S1 & S2 heard, RRR. No JVD,  rubs, gallops or clicks. No murmurs. Gastrointestinal system: Abdomen is nondistended, soft and nontender. No organomegaly or masses felt. Normal bowel sounds heard. Central nervous system: Alert and oriented. No focal neurological deficits. Extremities: Dressings intact and not examined  today. Skin: No rashes, lesions or ulcers except as noted on left foot below. Psychiatry: Judgement and insight appear normal. Mood & affect appropriate.   Data Reviewed:   I have personally reviewed following labs and imaging studies:  Labs: Basic Metabolic Panel:  Recent Labs Lab 08/02/15 0330 08/03/15 0250 08/04/15 0259 08/05/15 0339 08/06/15 0258 08/07/15 1046 08/08/15 0243  NA 137 136 136 137 136 136 136  K 4.3 3.9 4.0 4.3 4.4 4.6 4.7  CL 102 101 102 101 101 99* 99*  CO2 29 29 28 30 28 29 29   GLUCOSE 162* 170* 122* 110* 147* 213* 153*  BUN 11 12 10 11 12 13 15   CREATININE 1.67* 1.66* 1.63* 1.59* 1.64* 1.54* 1.57*  CALCIUM 8.8* 8.6* 8.8* 9.2 9.0 9.5 9.5  PHOS 4.2 4.0 4.3 3.9 4.3  --   --    GFR Estimated Creatinine Clearance: 55.1 mL/min (by C-G formula based on Cr of 1.57). Liver Function Tests:  Recent Labs Lab 08/02/15 0330 08/03/15 0250 08/04/15 0259 08/05/15 0339 08/06/15 0258  ALBUMIN 2.0* 2.0* 2.2* 2.5* 2.4*   No results for input(s): AMMONIA in the last 168 hours. CBC:  Recent Labs Lab 08/02/15 0345 08/07/15 1046 08/08/15 0243  WBC 11.7* 11.1* 10.3  HGB 8.0* 8.3* 7.9*  HCT 25.8* 27.2* 27.1*  MCV 86.6 88.9 87.1  PLT 644* 550* 573*   CBG:  Recent Labs Lab 08/07/15 1611 08/07/15 2142 08/08/15 0608 08/08/15 1008 08/08/15 1633  GLUCAP 84  176* 152* 119* 75*   Microbiology Recent Results (from the past 240 hour(s))  Surgical pcr screen     Status: Abnormal   Collection Time: 07/31/15  9:51 PM  Result Value Ref Range Status   MRSA, PCR NEGATIVE NEGATIVE Final   Staphylococcus aureus POSITIVE (A) NEGATIVE Final    Comment:        The Xpert SA Assay (FDA approved for NASAL specimens in patients over 30 years of age), is one component of a comprehensive surveillance program.  Test performance has been validated by [facility] for patients greater than or equal to 51 year old. It is not intended to diagnose infection nor to guide  or monitor treatment.     Radiology: No results found.  Medications:   . sodium chloride   Intravenous Once  . carvedilol  3.125 mg Oral BID WC  . cefTRIAXone (ROCEPHIN)  IV  1 g Intravenous Q24H  . Chlorhexidine Gluconate Cloth  6 each Topical Daily  . docusate sodium  100 mg Oral Daily  . famotidine  20 mg Oral Daily  . folic acid  1 mg Oral Daily  . gabapentin  300 mg Oral QHS  . heparin subcutaneous  5,000 Units Subcutaneous Q8H  . insulin aspart  0-5 Units Subcutaneous QHS  . insulin aspart  0-9 Units Subcutaneous TID WC  . insulin aspart  4 Units Subcutaneous TID WC  . insulin glargine  10 Units Subcutaneous Daily  . multivitamin with minerals  1 tablet Oral Daily  . mupirocin ointment  1 application Nasal BID  . polyethylene glycol  17 g Oral Daily  . silver sulfADIAZINE   Topical Daily  . simvastatin  20 mg Oral q1800  . tamsulosin  0.4 mg Oral Daily  . thiamine  100 mg Oral Daily   Continuous Infusions: . sodium chloride Stopped (08/01/15 1827)  . sodium chloride 100 mL/hr at 08/08/15 0430  . sodium chloride 50 mL/hr (08/08/15 0917)    Time spent: 25 minutes.   LOS: 16 days   [facility] Pager [phone]. If unable to reach me by pager, please call my cell phone at [phone].  *Please refer to [email or web address], password [email or web address] to get updated schedule on who will round on this patient, as hospitalists switch teams weekly. If 7PM-7AM, please contact night-coverage at [email or web address], password [email or web address] for any overnight needs.  08/08/2015, 7:07 PM

## 2015-08-09 LAB — BASIC METABOLIC PANEL
ANION GAP: 9 (ref 5–15)
BUN: 16 mg/dL (ref 6–20)
CALCIUM: 9.2 mg/dL (ref 8.9–10.3)
CO2: 28 mmol/L (ref 22–32)
Chloride: 99 mmol/L — ABNORMAL LOW (ref 101–111)
Creatinine, Ser: 1.4 mg/dL — ABNORMAL HIGH (ref 0.61–1.24)
GFR calc Af Amer: >60 mL/min (ref >60)
GFR, EST NON AFRICAN AMERICAN: 52 mL/min — AB (ref >60)
GLUCOSE: 159 mg/dL — AB (ref 65–99)
POTASSIUM: 4.6 mmol/L (ref 3.5–5.1)
SODIUM: 136 mmol/L (ref 135–145)

## 2015-08-09 LAB — GLUCOSE, CAPILLARY
GLUCOSE-CAPILLARY: 173 mg/dL — AB (ref 65–99)
GLUCOSE-CAPILLARY: 102 mg/dL — AB (ref 65–99)
Glucose-Capillary: 123 mg/dL — ABNORMAL HIGH (ref 65–99)
Glucose-Capillary: 129 mg/dL — ABNORMAL HIGH (ref 65–99)

## 2015-08-09 LAB — CBC
HCT: 26.2 % — ABNORMAL LOW (ref 39.0–52.0)
Hemoglobin: 7.7 g/dL — ABNORMAL LOW (ref 13.0–17.0)
MCH: 25.8 pg — ABNORMAL LOW (ref 26.0–34.0)
MCHC: 29.4 g/dL — ABNORMAL LOW (ref 30.0–36.0)
MCV: 87.6 fL (ref 78.0–100.0)
PLATELETS: 551 10*3/uL — AB (ref 150–400)
RBC: 2.99 MIL/uL — AB (ref 4.22–5.81)
RDW: 14.4 % (ref 11.5–15.5)
WBC: 10.3 10*3/uL (ref 4.0–10.5)
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
## 2015-08-09 NOTE — Progress Notes (Signed)
[facility] Progress Note  Subjective  - No complaints.   Objective Filed Vitals:   08/08/15 2016 08/09/15 0626  BP: 128/85 137/79  Pulse: 100 99  Temp: 99.3 F (37.4 C) 98.1 F (36.7 C)  Resp: 20 20    Intake/Output Summary (Last 24 hours) at 08/09/15 0743 Last data filed at 08/09/15 0048  Gross per 24 hour  Intake    840 ml  Output   2625 ml  Net  -1785 ml   Left groin soft without hematoma Left foot dressing clean  Assessment/Planning: 65 y.o. male is s/p: left lower extremity arteriogram with intervention 1 Day Post-Op    Successful balloon angioplasty of left popliteal artery and tibioperoneal trunk. Failed balloon angioplasty left posterior tibial artery. Single vessel-runoff via peroneal which reconstitutes posterior tibial Patient with inadequate GSV for bypass.  Creatinine stable. Hopefully left foot will improve. Continue dressing changes. No further intervention at this time.  Ok to d/c from vascular standpoint. F/u in 3 weeks.    Cory Weber 08/09/2015 7:43 AM --  Laboratory CBC    Component Value Date/Time   WBC 10.3 08/09/2015 0330   HGB 7.7* 08/09/2015 0330   HCT 26.2* 08/09/2015 0330   HCT 32 10/31/2011 1319   PLT 551* 08/09/2015 0330    BMET    Component Value Date/Time   NA 136 08/09/2015 0330   NA 140 10/31/2011 1319   K 4.6 08/09/2015 0330   K 4.0 10/31/2011 1319   CL 99* 08/09/2015 0330   CO2 28 08/09/2015 0330   GLUCOSE 159* 08/09/2015 0330   BUN 16 08/09/2015 0330   BUN 10 10/31/2011 1319   CREATININE 1.40* 08/09/2015 0330   CREATININE 0.99 10/31/2011 1319   CALCIUM 9.2 08/09/2015 0330   CALCIUM 9.0 10/31/2011 1319   GFRNONAA 52* 08/09/2015 0330   GFRAA >60 08/09/2015 0330    COAG Lab Results  Component Value Date   INR 1.35 08/08/2015   INR 1.33 07/25/2015   INR 0.97 02/17/2015   No results found for: PTT  Antibiotics Anti-infectives    Start     Dose/Rate Route Frequency Ordered Stop    07/27/15 1300  cefTRIAXone (ROCEPHIN) 1 g in dextrose 5 % 50 mL IVPB     1 g 100 mL/hr over 30 Minutes Intravenous Every 24 hours 07/27/15 1206     07/26/15 1100  cefUROXime (ZINACEF) 1.5 g in dextrose 5 % 50 mL IVPB  Status:  Discontinued     1.5 g 100 mL/hr over 30 Minutes Intravenous To ShortStay Surgical 07/25/15 1153 07/27/15 1206   07/25/15 1230  vancomycin (VANCOCIN) 1,500 mg in sodium chloride 0.9 % 500 mL IVPB  Status:  Discontinued     1,500 mg 250 mL/hr over 120 Minutes Intravenous Every 24 hours 07/25/15 1152 07/27/15 1206   07/25/15 0600  cefUROXime (ZINACEF) 1.5 g in dextrose 5 % 50 mL IVPB  Status:  Discontinued     1.5 g 100 mL/hr over 30 Minutes Intravenous To ShortStay Surgical 07/24/15 1322 07/25/15 1153   07/24/15 1800  vancomycin (VANCOCIN) 1,500 mg in sodium chloride 0.9 % 500 mL IVPB  Status:  Discontinued     1,500 mg 250 mL/hr over 120 Minutes Intravenous Every 24 hours 07/23/15 2104 07/25/15 1152   07/24/15 0030  piperacillin-tazobactam (ZOSYN) IVPB 3.375 g  Status:  Discontinued     3.375 g 12.5 mL/hr over 240 Minutes Intravenous Every 8 hours 07/23/15 2104 07/27/15 1206  07/23/15 2115  vancomycin (VANCOCIN) IVPB 1000 mg/200 mL premix     1,000 mg 200 mL/hr over 60 Minutes Intravenous NOW 07/23/15 2104 07/23/15 2342   07/23/15 1545  piperacillin-tazobactam (ZOSYN) IVPB 3.375 g     3.375 g 12.5 mL/hr over 240 Minutes Intravenous  Once 07/23/15 1544 07/23/15 1709   07/23/15 1545  vancomycin (VANCOCIN) IVPB 1000 mg/200 mL premix     1,000 mg 200 mL/hr over 60 Minutes Intravenous  Once 07/23/15 1544 07/23/15 1812       [name], PA-C [facility] Office: [phone] Pager: [phone] 08/09/2015 7:43 AM
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
## 2015-08-10 LAB — CBC
HCT: 24.8 % — ABNORMAL LOW (ref 39.0–52.0)
HEMOGLOBIN: 7.7 g/dL — AB (ref 13.0–17.0)
MCH: 27.1 pg (ref 26.0–34.0)
MCHC: 31 g/dL (ref 30.0–36.0)
MCV: 87.3 fL (ref 78.0–100.0)
PLATELETS: 482 10*3/uL — AB (ref 150–400)
RBC: 2.84 MIL/uL — AB (ref 4.22–5.81)
RDW: 14.4 % (ref 11.5–15.5)
WBC: 8.8 10*3/uL (ref 4.0–10.5)

## 2015-08-10 LAB — BASIC METABOLIC PANEL
Anion gap: 9 (ref 5–15)
BUN: 14 mg/dL (ref 6–20)
CALCIUM: 9.4 mg/dL (ref 8.9–10.3)
CO2: 28 mmol/L (ref 22–32)
CREATININE: 1.34 mg/dL — AB (ref 0.61–1.24)
Chloride: 100 mmol/L — ABNORMAL LOW (ref 101–111)
GFR, EST NON AFRICAN AMERICAN: 55 mL/min — AB (ref >60)
Glucose, Bld: 102 mg/dL — ABNORMAL HIGH (ref 65–99)
Potassium: 4.8 mmol/L (ref 3.5–5.1)
SODIUM: 137 mmol/L (ref 135–145)

## 2015-08-10 LAB — GLUCOSE, CAPILLARY
GLUCOSE-CAPILLARY: 141 mg/dL — AB (ref 65–99)
Glucose-Capillary: 113 mg/dL — ABNORMAL HIGH (ref 65–99)

## 2015-08-10 MED ORDER — OXYCODONE-ACETAMINOPHEN 5-325 MG PO TABS: 2.0000 | ORAL_TABLET | Freq: Four times a day (QID) | ORAL | Status: DC | PRN

## 2015-08-10 MED ORDER — CARVEDILOL 3.125 MG PO TABS: 3.1250 mg | ORAL_TABLET | Freq: Two times a day (BID) | ORAL | Status: DC

## 2015-08-10 MED ORDER — FAMOTIDINE 20 MG PO TABS: 20.0000 mg | ORAL_TABLET | Freq: Every day | ORAL | Status: DC

## 2015-08-10 MED ORDER — THIAMINE HCL 100 MG PO TABS: 100.0000 mg | ORAL_TABLET | Freq: Every day | ORAL | Status: DC

## 2015-08-10 MED ORDER — SILVER SULFADIAZINE 1 % EX CREA: TOPICAL_CREAM | Freq: Every day | CUTANEOUS | Status: DC

## 2015-08-10 MED ORDER — ADULT MULTIVITAMIN W/MINERALS CH: 1.0000 | ORAL_TABLET | Freq: Every day | ORAL | Status: DC

## 2015-08-10 MED ORDER — TAMSULOSIN HCL 0.4 MG PO CAPS: 0.4000 mg | ORAL_CAPSULE | Freq: Every day | ORAL | Status: DC

## 2015-08-10 MED ORDER — FOLIC ACID 1 MG PO TABS: 1.0000 mg | ORAL_TABLET | Freq: Every day | ORAL | Status: DC

## 2015-08-10 NOTE — Care Management Note (Signed)
Case Management Note Marvetta Gibbons RN, BSN Unit 2W-Case Manager [phone]  Patient Details  Name: Cory Weber MRN: [id number] Date of Birth: May 11, 1950  Subjective/Objective:    Pt admitted with cellulitis     tx from 5N to 2W post cath on 08/01/15- CM to follow for d/c needs            Action/Plan: PTA pt lived at home alone uses walker, has family support in area.   Expected Discharge Date:    08/10/15              Expected Discharge Plan:  [place]  In-House Referral:     Discharge planning Services  CM Consult  Post Acute Care Choice:  Home Health, Durable Medical Equipment Choice offered to:  Patient  DME Arranged:  Bedside commode DME Agency:  [place] Arranged:  RN [facility] - Inpatient Agency:  [facility]  Status of Service:  Completed, signed off  If discussed at [place] of Stay Meetings, dates discussed:  08/10/15  Additional Comments:  08/10/15- 1100- Tarell Schollmeyer RN, CM- pt for d/c today- CSW has been following for possible STSNF placement- plan now is to return home with [facility]- spoke with pt at bedside- choice offered for [facility] agency in [facility]- per pt choice he would like to try [facility] for [facility] services - referral called to [place] with [place] - f/u for foot wound. Pt does not have a qualifying dx with Medicaid to receive [place] therapies. Per pt he would like a BSC for home- order placed- Notified Jermaine with [facility] for DME need- BSC to be delivered to room prior to discharge- to call and see if he can find ride home- will let Bedside RN know if he has any trouble finding ride.   Dawayne Patricia, RN 08/10/2015, 11:49 AM

## 2015-08-10 NOTE — Progress Notes (Signed)
While assessing pt today pt stated " I fell last night but don't tell anyone", pt checked over with no issues. A&Ox3, bed alarm turned on in which pt stated" I will just turn the alarm off when you leave". Pt educated the risk for fall and pt showed no interest. Will continue to monitor.

## 2015-08-10 NOTE — Discharge Instructions (Signed)
Peripheral Vascular Disease Peripheral vascular disease (PVD) is a disease of the blood vessels that are not part of your heart and brain. A simple term for PVD is poor circulation. In most cases, PVD narrows the blood vessels that carry blood from your heart to the rest of your body. This can result in a decreased supply of blood to your arms, legs, and internal organs, like your stomach or kidneys. However, it most often affects a person's lower legs and feet. There are two types of PVD.  Organic PVD. This is the more common type. It is caused by damage to the structure of blood vessels.  Functional PVD. This is caused by conditions that make blood vessels contract and tighten (spasm). Without treatment, PVD tends to get worse over time. PVD can also lead to acute ischemic limb. This is when an arm or limb suddenly has trouble getting enough blood. This is a medical emergency.  HOME CARE  Take medicines only as told by your doctor.  Do not use any tobacco products, including cigarettes, chewing tobacco, or electronic cigarettes. If you need help quitting, ask your doctor.  Lose weight if you are overweight, and maintain a healthy weight as told by your doctor.  Eat a diet that is low in fat and cholesterol. If you need help, ask your doctor.  Exercise regularly. Ask your doctor for some good activities for you.  Take good care of your feet.  Wear comfortable shoes that fit well.  Check your feet often for any cuts or sores. GET HELP IF:  You have cramps in your legs while walking.  You have leg pain when you are at rest.  You have coldness in a leg or foot.  Your skin changes.  You are unable to get or have an erection (erectile dysfunction).  You have cuts or sores on your feet that are not healing. GET HELP RIGHT AWAY IF:  Your arm or leg turns cold and blue.  Your arms or legs become red, warm, swollen, painful, or numb.  You have chest pain or trouble  breathing.  You suddenly have weakness in your face, arm, or leg.  You become very confused or you cannot speak.  You suddenly have a very bad headache.  You suddenly cannot see.   This information is not intended to replace advice given to you by your health care provider. Make sure you discuss any questions you have with your health care provider.   Document Released: 04/24/2009 Document Revised: 02/18/2014 Document Reviewed: 07/08/2013 Elsevier Interactive Patient Education 2016 Elsevier Inc. Cellulitis Cellulitis is an infection of the skin and the tissue beneath it. The infected area is usually red and tender. Cellulitis occurs most often in the arms and lower legs.  CAUSES  Cellulitis is caused by bacteria that enter the skin through cracks or cuts in the skin. The most common types of bacteria that cause cellulitis are staphylococci and streptococci. SIGNS AND SYMPTOMS   Redness and warmth.  Swelling.  Tenderness or pain.  Fever. DIAGNOSIS  Your health care provider can usually determine what is wrong based on a physical exam. Blood tests may also be done. TREATMENT  Treatment usually involves taking an antibiotic medicine. HOME CARE INSTRUCTIONS   Take your antibiotic medicine as directed by your health care provider. Finish the antibiotic even if you start to feel better.  Keep the infected arm or leg elevated to reduce swelling.  Apply a warm cloth to the affected area up  to 4 times per day to relieve pain.  Take medicines only as directed by your health care provider.  Keep all follow-up visits as directed by your health care provider. SEEK MEDICAL CARE IF:   You notice red streaks coming from the infected area.  Your red area gets larger or turns dark in color.  Your bone or joint underneath the infected area becomes painful after the skin has healed.  Your infection returns in the same area or another area.  You notice a swollen bump in the infected  area.  You develop new symptoms.  You have a fever. SEEK IMMEDIATE MEDICAL CARE IF:   You feel very sleepy.  You develop vomiting or diarrhea.  You have a general ill feeling (malaise) with muscle aches and pains.   This information is not intended to replace advice given to you by your health care provider. Make sure you discuss any questions you have with your health care provider.   Document Released: 11/07/2004 Document Revised: 10/19/2014 Document Reviewed: 04/15/2011 Elsevier Interactive Patient Education [employer].

## 2015-08-10 NOTE — Discharge Summary (Signed)
Physician Discharge Summary  DRAX STURDEVANT [id number] DOB: 1950/10/20 DOA: 07/23/2015  PCP: Maggie Font, MD  Admit date: 07/23/2015 Discharge date: 08/10/2015  Time spent: 45 minutes  Recommendations for Outpatient Follow-up:  Patient will be discharged to home with home health RN.  Patient will need to follow up with primary care provider within one week of discharge, repeat CBC.  Follow up with Dr. Trula Slade in 3 weeks. Patient should continue medications as prescribed.  Patient should follow a heart healthy/carb modified diet.   Discharge Diagnoses:  Cellulitis L foot Complicated by peripheral vascular disease Essential hypertension Type 2 diabetes mellitus with vascular disease (HCC) Normocytic normochromic anemia/Anemia of chronic disease Hyperlipidemia Tobacco abuse History of hepatitis C with possible cirrhosis Stage III chronic kidney disease/urinary retention Acute encephalopathy  Discharge Condition: Stable  Diet recommendation: heart healthy/carb modified  Filed Weights   07/23/15 1453  Weight: 90.266 kg (199 lb)    History of present illness:  on 07/23/2015 by Dr. Gean Birchwood Cory Weber is a 65 y.o. male with past medical history significant for diabetes mellitus type 2, hypertension, hyperlipidemia and tobacco abuse who was recently admitted for left foot cellulitis after patient had immeresed his foot in hot water. But due to his neuropathy he did not realize how hot the water was. At that time patient had blister on his right great toe which gone down but developed wound around the great toe. Patient was admitted and placed on antibiotics and vascular surgeon was consulted and ABI was 0.4. Patient was referred to [facility] surgeon as outpatient with Dr. Bridgett Larsson. Patient states his pain worsened last 2-3 days after discharge with new blisters forming on the dorsal aspect of his left foot around the lateral toes. There is also gangrenous appearing left toe.  Patient is mildly tachycardic and has leukocytosis. On-call vascular surgeon Dr. Trula Slade at [facility] was consulted by the ER physician. Patient will be transferred to [facility] for further management of his cellulitis and peripheral vascular disease. Patient is agreeable to transfer. Patient also has anemia and during last admission was noted to have stool for occult blood positive. Patient was taking NSAIDs and was instructed to stop and was advised further GI workup as outpatient.  Hospital Course:  Cellulitis L foot Complicated by peripheral vascular disease -Initially placed on vancomycin and Zosyn, antibiotics subsequently changed to Rocephin. -Underwent angiogram which showed popliteal occlusion.  -Vascular surgery consulted and appreciated, underwent carotid Dopplers and vein mapping 08/02/15 in anticipation of surgery. No significant ICA stenosis noted. S/p Balloon angioplasty left popliteal artery and tibial peroneal trunk on 08/08/15 -Cardiology consulted for cardiac clearance preoperatively. Echo showed EF 45-50% with no regional wall motion abnormalities, with prior Echo done 06/25/13 showing EF of 50-55%.  -PT consulted and recommended SNF- however patient refused to stay at SNF -Will discharge him with home health RN (cannot have PT due to medicaid) -Follow up with vascular surgery in 3 weeks  Essential hypertension -Lisinopril and HCTZ on held during admission -Low-dose Coreg started by cardiology.  -Resume ACE inhibitor postoperatively if renal function remains stable.  Type 2 diabetes mellitus with vascular disease (HCC) -Oral hypoglycemics on hold, may restart at discharge  Normocytic normochromic anemia/Anemia of chronic disease -Was Hemoccult positive on recent admission.  -Patient was instructed to stop taking NSAIDs.  -Continue Pepcid.  -Received 1 unit of PRBCs.  -Hemoglobin currently 7.7 (stable for several days) -Repeat CBC in one  week  Hyperlipidemia -Continue statin.  Tobacco abuse -  Tobacco cessation counseling requested.  History of hepatitis C with possible cirrhosis -Status post antiviral treatment for hepatitis C.  Stage III chronic kidney disease/urinary retention -Patient was started on Flomax  -Renal ultrasound showed medical renal disease.  -nephrology consulted and appreciated -Creatinine 1.34 today -repeat BMP in one week  Acute encephalopathy -Resolved -Pain medications discontinued and dose of Neurontin decreased with improvement in mental status.  Consultants Cardiology Vascular surgery Nephrology  Procedures  Echocardiogram Carotid Doppler Vascular US Lower Extremity Saphenous Vein Mapping  Balloon angioplasty left popliteal artery and tibial peroneal trunk  Discharge Exam: Filed Vitals:   08/09/15 2016 08/10/15 0422  BP: 155/91 145/76  Pulse: 77 91  Temp: 98.4 F (36.9 C) 98.3 F (36.8 C)  Resp: 19 18   Exam  General: Well developed, well nourished, NAD  HEENT: NCAT,mucous membranes moist.   Cardiovascular: S1 S2 auscultated, no murmurs, RRR  Respiratory: Clear to auscultation bilaterally  Abdomen: Soft, nontender, nondistended, + bowel sounds  Extremities: warm dry without cyanosis clubbing. Left foot wrapped  Neuro: AAOx3, nonfocal  Discharge Instructions      Discharge Instructions    Discharge instructions    Complete by:  As directed   Patient will be discharged to home with home health RN.  Patient will need to follow up with primary care provider within one week of discharge, repeat CBC.  Follow up with Dr. Trula Slade in 3 weeks. Patient should continue medications as prescribed.  Patient should follow a heart healthy/carb modified diet.            Medication List    STOP taking these medications        doxycycline 50 MG capsule  Commonly known as:  VIBRAMYCIN      TAKE these medications        aspirin EC 81 MG tablet  Take 1 tablet  (81 mg total) by mouth daily.     carvedilol 3.125 MG tablet  Commonly known as:  COREG  Take 1 tablet (3.125 mg total) by mouth 2 (two) times daily with a meal.     famotidine 20 MG tablet  Commonly known as:  PEPCID  Take 1 tablet (20 mg total) by mouth daily.     folic acid 1 MG tablet  Commonly known as:  FOLVITE  Take 1 tablet (1 mg total) by mouth daily.     gabapentin 300 MG capsule  Commonly known as:  NEURONTIN  Take 1 capsule (300 mg total) by mouth 2 (two) times daily.     glipiZIDE 5 MG tablet  Commonly known as:  GLUCOTROL  Take 5 mg by mouth 2 (two) times daily before a meal.     lisinopril-hydrochlorothiazide 20-12.5 MG tablet  Commonly known as:  PRINZIDE,ZESTORETIC  Take 1 tablet by mouth 2 (two) times daily.     metFORMIN 1000 MG tablet  Commonly known as:  GLUCOPHAGE  Take 1 tablet (1,000 mg total) by mouth 2 (two) times daily with a meal.     multivitamin with minerals Tabs tablet  Take 1 tablet by mouth daily.     oxyCODONE-acetaminophen 5-325 MG tablet  Commonly known as:  PERCOCET/ROXICET  Take 2 tablets by mouth every 6 (six) hours as needed for severe pain.     silver sulfADIAZINE 1 % cream  Commonly known as:  SILVADENE  Apply topically daily.     simvastatin 20 MG tablet  Commonly known as:  ZOCOR  TAKE ONE TABLET BY MOUTH DAILY AT  6 PM.     tamsulosin 0.4 MG Caps capsule  Commonly known as:  FLOMAX  Take 1 capsule (0.4 mg total) by mouth daily.     thiamine 100 MG tablet  Take 1 tablet (100 mg total) by mouth daily.       No Known Allergies Follow-up Information    Follow up with Annamarie Major, MD In 3 weeks.   Specialties:  Vascular Surgery, Cardiology   Why:  Our office will call you to arrange an appointment (sent)   Contact information:   [place] [id number] [phone]       Follow up with Maggie Font, MD. Schedule an appointment as soon as possible for a visit in 1 week.   Specialty:  Family Medicine    Why:  Hospital follow up, repeat CBC and BMP   Contact information:   [address] [place] [address] [phone]        The results of significant diagnostics from this hospitalization (including imaging, microbiology, ancillary and laboratory) are listed below for reference.    Significant Diagnostic Studies: US Renal  07/27/2015  CLINICAL DATA:  Acute kidney injury EXAM: RENAL / URINARY TRACT ULTRASOUND COMPLETE COMPARISON:  None. FINDINGS: Right Kidney: Length: 12.6 cm. Mildly increased echogenicity. No hydronephrosis or mass. Left Kidney: Length: 12.6 cm. Mildly increased echogenicity. No hydronephrosis or mass. Bladder: Not evaluated.  Decompressed by Foley catheter. IMPRESSION: Evidence of medical renal disease Electronically Signed   By: Skipper Cliche M.D.   On: 07/27/2015 11:21   US Arterial Seg Multiple  07/19/2015  CLINICAL DATA:  65 year old male with left foot cellulitis and pain. EXAM: NONINVASIVE PHYSIOLOGIC VASCULAR STUDY OF BILATERAL LOWER EXTREMITIES TECHNIQUE: Evaluation of both lower extremities was performed at rest, including calculation of ankle-brachial indices, multiple segmental pressure evaluation, segmental Doppler and segmental pulse volume recording. COMPARISON:  None. FINDINGS: Right ABI:  0.91 Left ABI:  0.45 Right Lower Extremity: Significant pressure differential between the low thigh and calf cuffs suggesting distal femoral popliteal disease. Preserved digital waveforms on PVRs. Left Lower Extremity: Significant pressure gradient between the low thigh cuff and calf cuff consistent with distal femoral popliteal disease. Additionally, there are significant pressure gradients between the calf and ankle consistent with runoff disease. IMPRESSION: 1. Resting right ankle brachial index of 0.91 consistent with at least mild peripheral arterial disease. Segmental pressures, arterial waveforms and PVRs suggest femoral popliteal disease. 2. Resting left ankle  brachial index of 0.45 consistent with advanced peripheral arterial disease. Segmental pressures, arterial waveforms and PVRs suggest both femoral popliteal disease and runoff disease. Given clinical history of left foot wound and rest pain these findings are concerning for critical limb ischemia (CLI). Recommend referral to vascular Interventional Radiology or other endovascular specialist for further evaluation and management. Signed, Criselda Peaches, MD [facility] Radiology Electronically Signed   By: Jacqulynn Cadet M.D.   On: 07/19/2015 16:22   Dg Foot 2 Views Left  07/24/2015  CLINICAL DATA:  Cellulitis with wound on first toe EXAM: LEFT FOOT - 2 VIEW COMPARISON:  July 18, 2015 FINDINGS: Frontal and lateral views were obtained. There is no demonstrable fracture or dislocation. The joint spaces appear normal. No erosive change or bony destruction. There is soft tissue swelling over the dorsum of the foot. There is no soft tissue air or radiopaque foreign body. IMPRESSION: No bony destruction or erosion. No fracture or dislocation. Soft tissue swelling over the  dorsal foot. No soft tissue abscess or radiopaque foreign body evident. Electronically Signed   By: Lowella Grip III M.D.   On: 07/24/2015 07:23   Dg Foot Complete Left  07/18/2015  CLINICAL DATA:  Left foot pain with ulcer along the great toe and fourth toe for 2 weeks. Diabetic. EXAM: LEFT FOOT - COMPLETE 3+ VIEW COMPARISON:  06/29/2015 prior exams FINDINGS: There is no evidence of acute fracture, subluxation or dislocation. No radiographic evidence of osteomyelitis identified. No radiopaque foreign bodies are present. Vascular calcifications are again noted. IMPRESSION: No acute bony abnormalities. Electronically Signed   By: Margarette Canada M.D.   On: 07/18/2015 14:21    Microbiology: Recent Results (from the past 240 hour(s))  Surgical pcr screen     Status: Abnormal   Collection  Time: 07/31/15  9:51 PM  Result Value Ref Range Status   MRSA, PCR NEGATIVE NEGATIVE Final   Staphylococcus aureus POSITIVE (A) NEGATIVE Final    Comment:        The Xpert SA Assay (FDA approved for NASAL specimens in patients over 20 years of age), is one component of a comprehensive surveillance program.  Test performance has been validated by [facility] for patients greater than or equal to 74 year old. It is not intended to diagnose infection nor to guide or monitor treatment.      Labs: Basic Metabolic Panel:  Recent Labs Lab 08/04/15 0259 08/05/15 0339 08/06/15 0258 08/07/15 1046 08/08/15 0243 08/09/15 0330 08/10/15 0228  NA 136 137 136 136 136 136 137  K 4.0 4.3 4.4 4.6 4.7 4.6 4.8  CL 102 101 101 99* 99* 99* 100*  CO2 28 30 28 29 29 28 28   GLUCOSE 122* 110* 147* 213* 153* 159* 102*  BUN 10 11 12 13 15 16 14   CREATININE 1.63* 1.59* 1.64* 1.54* 1.57* 1.40* 1.34*  CALCIUM 8.8* 9.2 9.0 9.5 9.5 9.2 9.4  PHOS 4.3 3.9 4.3  --   --   --   --    Liver Function Tests:  Recent Labs Lab 08/04/15 0259 08/05/15 0339 08/06/15 0258  ALBUMIN 2.2* 2.5* 2.4*   No results for input(s): LIPASE, AMYLASE in the last 168 hours. No results for input(s): AMMONIA in the last 168 hours. CBC:  Recent Labs Lab 08/07/15 1046 08/08/15 0243 08/09/15 0330 08/10/15 0228  WBC 11.1* 10.3 10.3 8.8  HGB 8.3* 7.9* 7.7* 7.7*  HCT 27.2* 27.1* 26.2* 24.8*  MCV 88.9 87.1 87.6 87.3  PLT 550* 573* 551* 482*   Cardiac Enzymes: No results for input(s): CKTOTAL, CKMB, CKMBINDEX, TROPONINI in the last 168 hours. BNP: BNP (last 3 results) No results for input(s): BNP in the last 8760 hours.  ProBNP (last 3 results) No results for input(s): PROBNP in the last 8760 hours.  CBG:  Recent Labs Lab 08/09/15 0618 08/09/15 1143 08/09/15 1643 08/09/15 2152 08/10/15 0600  GLUCAP 173* 123* 129* 102* 141*       Signed:  Alizabeth Antonio  [facility] 08/10/2015, 10:44  AM

## 2015-08-10 NOTE — Clinical Social Work Note (Signed)
CSW met with patient. Patient reported that he is unwilling to stay at [facility]. CSW was unable to obtain Letter of Guarantee. Patient reported he would like to return home. CSW made [facility] and MD aware. CSW signing off.  [employer], LCSW [phone]

## 2015-08-10 NOTE — Progress Notes (Signed)
Entered pt room to give pain med. In which bed alarm was off and pt educated the importance of the bed alarm in keeping pt safe. Bed alarm placed back on. Pt stated " I need the bsc brought closer in case I have to poop". Educated pt again the need to call out before getting up. Pt showed no interest. Will continue to monitor. Vicente Males Therapist, sports

## 2015-08-11 ENCOUNTER — Encounter: Payer: Self-pay | Admitting: Gastroenterology

## 2015-08-12 ENCOUNTER — Telehealth: Payer: Self-pay | Admitting: Surgery

## 2015-08-12 NOTE — Telephone Encounter (Signed)
Sched appt 7/17 at 9. Spoke to pt to inform them of appt.

## 2015-08-12 NOTE — Telephone Encounter (Signed)
-----   Message from Mena Goes, RN sent at 08/09/2015  9:59 AM EDT ----- Regarding: 3 week postop    ----- Message -----    From: [name], PA-C    Sent: 08/09/2015   9:27 AM      To: Vvs Charge Pool  S/p 1. Antegrade ultrasound guided access left common femoral artery 2. Balloon angioplasty, left popliteal artery 3. Balloon angioplasty, left tibioperoneal trunk 4. Failed balloon angioplasty, left posterior tibial artery 5. Conscious sedation (7:25-8:32)  08/08/15  F/u with Dr. Trula Slade in 3 weeks  Thanks Maudie Mercury

## 2015-08-14 ENCOUNTER — Other Ambulatory Visit: Payer: Self-pay | Admitting: Cardiovascular Disease

## 2015-08-14 ENCOUNTER — Inpatient Hospital Stay (HOSPITAL_COMMUNITY)
Admission: EM | Admit: 2015-08-14 | Discharge: 2015-08-21 | DRG: 240 | Disposition: A | Discharge status: Rehabilitation Facility | Payer: Medicaid Other | Attending: Family Medicine | Admitting: Family Medicine

## 2015-08-14 ENCOUNTER — Encounter (HOSPITAL_COMMUNITY): Payer: Self-pay

## 2015-08-14 DIAGNOSIS — E1122 Type 2 diabetes mellitus with diabetic chronic kidney disease: Secondary | ICD-10-CM | POA: Diagnosis present

## 2015-08-14 DIAGNOSIS — E871 Hypo-osmolality and hyponatremia: Secondary | ICD-10-CM | POA: Diagnosis present

## 2015-08-14 DIAGNOSIS — Z8673 Personal history of transient ischemic attack (TIA), and cerebral infarction without residual deficits: Secondary | ICD-10-CM | POA: Insufficient documentation

## 2015-08-14 DIAGNOSIS — R269 Unspecified abnormalities of gait and mobility: Secondary | ICD-10-CM | POA: Insufficient documentation

## 2015-08-14 DIAGNOSIS — J45909 Unspecified asthma, uncomplicated: Secondary | ICD-10-CM | POA: Diagnosis present

## 2015-08-14 DIAGNOSIS — N400 Enlarged prostate without lower urinary tract symptoms: Secondary | ICD-10-CM | POA: Diagnosis present

## 2015-08-14 DIAGNOSIS — I739 Peripheral vascular disease, unspecified: Secondary | ICD-10-CM | POA: Insufficient documentation

## 2015-08-14 DIAGNOSIS — Z7984 Long term (current) use of oral hypoglycemic drugs: Secondary | ICD-10-CM

## 2015-08-14 DIAGNOSIS — G546 Phantom limb syndrome with pain: Secondary | ICD-10-CM | POA: Insufficient documentation

## 2015-08-14 DIAGNOSIS — D62 Acute posthemorrhagic anemia: Secondary | ICD-10-CM | POA: Diagnosis not present

## 2015-08-14 DIAGNOSIS — Z79899 Other long term (current) drug therapy: Secondary | ICD-10-CM

## 2015-08-14 DIAGNOSIS — R6510 Systemic inflammatory response syndrome (SIRS) of non-infectious origin without acute organ dysfunction: Secondary | ICD-10-CM | POA: Diagnosis present

## 2015-08-14 DIAGNOSIS — F1010 Alcohol abuse, uncomplicated: Secondary | ICD-10-CM | POA: Insufficient documentation

## 2015-08-14 DIAGNOSIS — F17210 Nicotine dependence, cigarettes, uncomplicated: Secondary | ICD-10-CM | POA: Diagnosis present

## 2015-08-14 DIAGNOSIS — K5900 Constipation, unspecified: Secondary | ICD-10-CM | POA: Diagnosis present

## 2015-08-14 DIAGNOSIS — Z981 Arthrodesis status: Secondary | ICD-10-CM

## 2015-08-14 DIAGNOSIS — R000 Tachycardia, unspecified: Secondary | ICD-10-CM | POA: Insufficient documentation

## 2015-08-14 DIAGNOSIS — B182 Chronic viral hepatitis C: Secondary | ICD-10-CM | POA: Insufficient documentation

## 2015-08-14 DIAGNOSIS — N183 Chronic kidney disease, stage 3 unspecified: Secondary | ICD-10-CM | POA: Diagnosis present

## 2015-08-14 DIAGNOSIS — Z7982 Long term (current) use of aspirin: Secondary | ICD-10-CM

## 2015-08-14 DIAGNOSIS — I10 Essential (primary) hypertension: Secondary | ICD-10-CM | POA: Diagnosis present

## 2015-08-14 DIAGNOSIS — E1152 Type 2 diabetes mellitus with diabetic peripheral angiopathy with gangrene: Secondary | ICD-10-CM | POA: Diagnosis present

## 2015-08-14 DIAGNOSIS — E11621 Type 2 diabetes mellitus with foot ulcer: Secondary | ICD-10-CM | POA: Diagnosis present

## 2015-08-14 DIAGNOSIS — D509 Iron deficiency anemia, unspecified: Secondary | ICD-10-CM | POA: Diagnosis present

## 2015-08-14 DIAGNOSIS — I129 Hypertensive chronic kidney disease with stage 1 through stage 4 chronic kidney disease, or unspecified chronic kidney disease: Secondary | ICD-10-CM | POA: Diagnosis present

## 2015-08-14 DIAGNOSIS — D638 Anemia in other chronic diseases classified elsewhere: Secondary | ICD-10-CM | POA: Insufficient documentation

## 2015-08-14 DIAGNOSIS — E785 Hyperlipidemia, unspecified: Secondary | ICD-10-CM | POA: Insufficient documentation

## 2015-08-14 DIAGNOSIS — B879 Myiasis, unspecified: Secondary | ICD-10-CM

## 2015-08-14 DIAGNOSIS — L97509 Non-pressure chronic ulcer of other part of unspecified foot with unspecified severity: Secondary | ICD-10-CM

## 2015-08-14 DIAGNOSIS — D72829 Elevated white blood cell count, unspecified: Secondary | ICD-10-CM | POA: Insufficient documentation

## 2015-08-14 DIAGNOSIS — L03116 Cellulitis of left lower limb: Secondary | ICD-10-CM | POA: Diagnosis present

## 2015-08-14 DIAGNOSIS — I96 Gangrene, not elsewhere classified: Secondary | ICD-10-CM

## 2015-08-14 DIAGNOSIS — L97529 Non-pressure chronic ulcer of other part of left foot with unspecified severity: Secondary | ICD-10-CM | POA: Diagnosis present

## 2015-08-14 DIAGNOSIS — E1142 Type 2 diabetes mellitus with diabetic polyneuropathy: Secondary | ICD-10-CM | POA: Diagnosis present

## 2015-08-14 HISTORY — DX: Type 2 diabetes mellitus without complications: E11.9

## 2015-08-14 HISTORY — DX: Cerebral infarction, unspecified: I63.9

## 2015-08-14 HISTORY — DX: Pneumonia, unspecified organism: J18.9

## 2015-08-14 LAB — COMPREHENSIVE METABOLIC PANEL
ALBUMIN: 2.7 g/dL — AB (ref 3.5–5.0)
ALK PHOS: 71 U/L (ref 38–126)
ALT: 19 U/L (ref 17–63)
ANION GAP: 9 (ref 5–15)
AST: 17 U/L (ref 15–41)
BILIRUBIN TOTAL: 0.2 mg/dL — AB (ref 0.3–1.2)
BUN: 12 mg/dL (ref 6–20)
CALCIUM: 9.2 mg/dL (ref 8.9–10.3)
CO2: 25 mmol/L (ref 22–32)
Chloride: 101 mmol/L (ref 101–111)
Creatinine, Ser: 1.17 mg/dL (ref 0.61–1.24)
GFR calc Af Amer: >60 mL/min (ref >60)
GLUCOSE: 247 mg/dL — AB (ref 65–99)
Potassium: 3.8 mmol/L (ref 3.5–5.1)
Sodium: 135 mmol/L (ref 135–145)
TOTAL PROTEIN: 8.2 g/dL — AB (ref 6.5–8.1)

## 2015-08-14 LAB — CBC WITH DIFFERENTIAL/PLATELET
BASOS ABS: 0 10*3/uL (ref 0.0–0.1)
BASOS PCT: 0 %
EOS PCT: 2 %
Eosinophils Absolute: 0.2 10*3/uL (ref 0.0–0.7)
HCT: 26.6 % — ABNORMAL LOW (ref 39.0–52.0)
Hemoglobin: 8 g/dL — ABNORMAL LOW (ref 13.0–17.0)
Lymphocytes Relative: 19 %
Lymphs Abs: 2.1 10*3/uL (ref 0.7–4.0)
MCH: 26.8 pg (ref 26.0–34.0)
MCHC: 30.1 g/dL (ref 30.0–36.0)
MCV: 89.3 fL (ref 78.0–100.0)
MONO ABS: 0.9 10*3/uL (ref 0.1–1.0)
Monocytes Relative: 8 %
NEUTROS ABS: 8.1 10*3/uL — AB (ref 1.7–7.7)
Neutrophils Relative %: 71 %
PLATELETS: 387 10*3/uL (ref 150–400)
RBC: 2.98 MIL/uL — ABNORMAL LOW (ref 4.22–5.81)
RDW: 14.7 % (ref 11.5–15.5)
WBC: 11.3 10*3/uL — AB (ref 4.0–10.5)

## 2015-08-14 LAB — CBG MONITORING, ED: Glucose-Capillary: 250 mg/dL — ABNORMAL HIGH (ref 65–99)

## 2015-08-14 NOTE — ED Notes (Signed)
Pt denies any fevers or chills, cough or nausea/vomiting. States "wound healing well otherwise, just didn't expect maggots when I unwrapped it."

## 2015-08-14 NOTE — ED Notes (Signed)
Pt states recent hospitalization, discharged 1 week ago. Pt is diabetic, hospitalized for poor wound healing to L foot d/t burn. Pt states increasing pain today, unwrapped foot and noticed maggots in wound.

## 2015-08-14 NOTE — ED Provider Notes (Addendum)
CSN: [id number]     Arrival date & time 08/14/15  1954 History   By signing my name below, I, Cory Weber, attest that this documentation has been prepared under the direction and in the presence of Mercadies Co, MD . Electronically Signed: Rowan Weber, Scribe. 08/15/2015. 12:19 AM.   Chief Complaint  Patient presents with  . Wound Infection   Patient is a 65 y.o. male presenting with wound check. The history is provided by the patient. No language interpreter was used.  Wound Check This is a new problem. The current episode started more than 1 week ago. The problem occurs constantly. The problem has been gradually worsening. Nothing aggravates the symptoms. Nothing relieves the symptoms. Treatments tried: silvadene. The treatment provided no relief.   HPI Comments:  Cory Weber is a 65 y.o. male with PMHx of DM, HTN, CVA, HLD and PVD who presents to the Emergency Department complaining of poor healing wound on left foot with increasing pain today. He unwrapped the wound ~1700 yesterday and noticed maggots. Pt states he has been changing the dressing every day and did not see any maggots yesterday. He notes walking around outside yesterday while the foot was dressed without a shoe. Pt was discharged on 08/10/15 for cellulitis of L foot complicated by PVD. He has a home health nurse monitoring the wound. Pt states the wound has been improving s/p discharge. He has been applying silvadene cream and taking oral antibiotics. Denies vomiting, diarrhea, fever, or any other wounds.   Past Medical History  Diagnosis Date  . Diabetes mellitus   . Hypertension   . Hepatitis C     states he was diagnosed in 2007 or 2007 while living in [place], [place]  . CVA (cerebral infarction) 01/2011  . Hyperlipidemia   . Asthma   . PVD (peripheral vascular disease) ([place])   . Anemia   . Stroke ([place])   . Headache(784.0)   . Peripheral neuropathy ([place])   . Cellulitis and abscess of foot 07/2015    Past Surgical History  Procedure Laterality Date  . Liver biopsy  2005    Done in [place], [place]. Chronic hepatitis with mild periportal inflammation, lobular unicellular necrosis and portal fibrosis. Grade 2, stage 1-2.  Marland Kitchen Colonoscopy with propofol N/A 09/03/2012    [id number] polyp-removed as outlined above. Prominent internal hemorrhoids. Tubular adenoma  . Esophagogastroduodenoscopy (egd) with propofol N/A 09/03/2012    [id number] hernia. Gastric diverticulum. Gastric ulcers with associated erosions. Duodenal erosions. Status post gastric biopsy. H.PYLORI gastritis   . Biopsy N/A 09/03/2012    Procedure: BIOPSY;  Surgeon: Daneil Dolin, MD;  Location: [facility];  Service: Endoscopy;  Laterality: N/A;  gastric and gastric mucosa  . Polypectomy N/A 09/03/2012    Procedure: POLYPECTOMY;  Surgeon: Daneil Dolin, MD;  Location: [facility];  Service: Endoscopy;  Laterality: N/A;  cecal polyp  . Esophagogastroduodenoscopy (egd) with propofol N/A 12/03/2012    Dr. Gala Romney: gastric diverticulum, gastric erosions and scar. Previously noted gastric ulcer completed healed. Biopsy without H.pylori.   . Biopsy N/A 12/03/2012    Procedure: BIOPSY;  Surgeon: Daneil Dolin, MD;  Location: [facility];  Service: Endoscopy;  Laterality: N/A;  . Maximum access (mas)posterior lumbar interbody fusion (plif) 1 level N/A 11/25/2013    Procedure: FOR MAXIMUM ACCESS (MAS) POSTERIOR LUMBAR INTERBODY FUSION (PLIF) 1 LEVEL;  Surgeon: Eustace Moore, MD;  Location: [facility];  Service: Neurosurgery;  Laterality: N/A;  FOR MAXIMUM ACCESS (MAS) POSTERIOR  LUMBAR INTERBODY FUSION (PLIF) 1 LEVEL LUMBAR 3-4  . Peripheral vascular catheterization Left 08/01/2015    Procedure: Lower Extremity Angiography;  Surgeon: Serafina Mitchell, MD;  Location: [facility];  Service: Cardiovascular;  Laterality: Left;  . Peripheral vascular catheterization N/A 08/01/2015    Procedure: Abdominal Aortogram;  Surgeon: Serafina Mitchell, MD;  Location:  [facility];  Service: Cardiovascular;  Laterality: N/A;  . Peripheral vascular catheterization N/A 08/08/2015    Procedure: Abdominal Aortogram w/Lower Extremity;  Surgeon: Serafina Mitchell, MD;  Location: [facility];  Service: Cardiovascular;  Laterality: N/A;  . Peripheral vascular catheterization Left 08/08/2015    Procedure: Peripheral Vascular Balloon Angioplasty;  Surgeon: Serafina Mitchell, MD;  Location: [facility];  Service: Cardiovascular;  Laterality: Left;  left popiteal artery, left peronealtrunk, left post tibial   Family History  Problem Relation Age of Onset  . Breast cancer Mother     deceased  . Cancer Mother   . Diabetes Father   . Hypertension Father   . Heart disease Father     deceased  . Hyperlipidemia Father   . Diabetes Sister   . Hypertension Sister   . Breast cancer Sister   . Colon cancer Neg Hx   . Liver disease Neg Hx   . Heart failure Mother    Social History  Substance Use Topics  . Smoking status: Current Some Day Smoker -- 0.50 packs/day for 20 years    Types: Cigarettes  . Smokeless tobacco: Never Used     Comment: given 1-800-Quit Now  . Alcohol Use: No    Review of Systems  Constitutional: Negative for fever.  Gastrointestinal: Negative for vomiting and diarrhea.  Skin: Positive for wound.  All other systems reviewed and are negative.   Allergies  Review of patient's allergies indicates no known allergies.  Home Medications   Prior to Admission medications   Medication Sig Start Date End Date Taking? Authorizing Provider  aspirin EC 81 MG tablet Take 1 tablet (81 mg total) by mouth daily. 07/20/15   Orvan Falconer, MD  carvedilol (COREG) 3.125 MG tablet Take 1 tablet (3.125 mg total) by mouth 2 (two) times daily with a meal. 08/10/15   Maryann Mikhail, DO  famotidine (PEPCID) 20 MG tablet Take 1 tablet (20 mg total) by mouth daily. 08/10/15   Maryann Mikhail, DO  folic acid (FOLVITE) 1 MG tablet Take 1 tablet (1 mg total)  by mouth daily. 08/10/15   Maryann Mikhail, DO  gabapentin (NEURONTIN) 300 MG capsule Take 1 capsule (300 mg total) by mouth 2 (two) times daily. 12/03/13   Lavon Paganini Angiulli, PA-C  glipiZIDE (GLUCOTROL) 5 MG tablet Take 5 mg by mouth 2 (two) times daily before a meal.     Historical Provider, MD  lisinopril-hydrochlorothiazide (PRINZIDE,ZESTORETIC) 20-12.5 MG per tablet Take 1 tablet by mouth 2 (two) times daily. 12/03/13   Lavon Paganini Angiulli, PA-C  metFORMIN (GLUCOPHAGE) 1000 MG tablet Take 1 tablet (1,000 mg total) by mouth 2 (two) times daily with a meal. 12/03/13   Lavon Paganini Angiulli, PA-C  Multiple Vitamin (MULTIVITAMIN WITH MINERALS) TABS tablet Take 1 tablet by mouth daily. 08/10/15   Maryann Mikhail, DO  oxyCODONE-acetaminophen (PERCOCET/ROXICET) 5-325 MG tablet Take 2 tablets by mouth every 6 (six) hours as needed for severe pain. 08/10/15   Maryann Mikhail, DO  silver sulfADIAZINE (SILVADENE) 1 % cream Apply topically daily. 08/10/15   Maryann Mikhail, DO  simvastatin ([place])  20 MG tablet TAKE ONE TABLET BY MOUTH DAILY AT 6 PM. 02/14/15   Herminio Commons, MD  tamsulosin (FLOMAX) 0.4 MG CAPS capsule Take 1 capsule (0.4 mg total) by mouth daily. 08/10/15   Maryann Mikhail, DO  thiamine 100 MG tablet Take 1 tablet (100 mg total) by mouth daily. 08/10/15   Maryann Mikhail, DO   BP 149/92 mmHg  Pulse 108  Temp(Src) 98.1 F (36.7 C) (Oral)  Resp 16  SpO2 98%   Physical Exam  Constitutional: He appears well-developed and well-nourished. No distress.  HENT:  Head: Normocephalic and atraumatic.  Mouth/Throat: Oropharynx is clear and moist. No oropharyngeal exudate.  Eyes: Pupils are equal, round, and reactive to light.  Neck: Normal range of motion. Neck supple. Carotid bruit is not present. No tracheal deviation present.  Cardiovascular: Normal rate, regular rhythm and intact distal pulses.   Pulmonary/Chest: Effort normal and breath sounds normal. No stridor. No respiratory distress. He has  no wheezes. He has no rales.  Abdominal: Soft. Bowel sounds are normal. There is no tenderness. There is no rebound and no guarding.  Musculoskeletal: Normal range of motion.  Left Foot: All 5 toes necrotic, 5th least necrotic  Neurological: He is alert. He has normal reflexes.  Skin: No pallor.  Cellulitis halfway up left calf; 2 inch wound to the dorsal aspect of left great toe, toenail missing; larger wound to lateral foot, 5 inches in length, 3in in diameter; fetid odor eminating from wounds, 1st toe had 2 cm blister on tip of toe, maggots eminating from nail bed of 1st and 2nd toe  Psychiatric: He has a normal mood and affect.  Nursing note and vitals reviewed.  ED Course  Procedures  DIAGNOSTIC STUDIES:  Oxygen Saturation is 98% on RA, normal by my interpretation.    COORDINATION OF CARE:  12:03 AM Will soak foot in hydrogen peroxide per Wound Care Society recommendation. Discussed treatment plan with pt at bedside and pt agreed to plan.  12:15 AM Started hydrogen peroxide soak. Will soak for 30 minutes.  12:44 AM Discussed admission with hospitalist.  Labs Review Labs Reviewed  COMPREHENSIVE METABOLIC PANEL - Abnormal; Notable for the following:    Glucose, Bld 247 (*)    Total Protein 8.2 (*)    Albumin 2.7 (*)    Total Bilirubin 0.2 (*)    All other components within normal limits  CBC WITH DIFFERENTIAL/PLATELET - Abnormal; Notable for the following:    WBC 11.3 (*)    RBC 2.98 (*)    Hemoglobin 8.0 (*)    HCT 26.6 (*)    Neutro Abs 8.1 (*)    All other components within normal limits  CBG MONITORING, ED - Abnormal; Notable for the following:    Glucose-Capillary 250 (*)    All other components within normal limits    Imaging Review No results found. I have personally reviewed and evaluated these images and lab results as part of my medical decision-making.   EKG Interpretation None      MDM   Final diagnoses:  None   Filed Vitals:   08/14/15 2359   BP: 149/92  Pulse: 108  Temp: 98.1 F (36.7 C)  Resp: 16   Results for orders placed or performed during the hospital encounter of 08/14/15  Comprehensive metabolic panel  Result Value Ref Range   Sodium 135 135 - 145 mmol/L   Potassium 3.8 3.5 - 5.1 mmol/L   Chloride 101 101 - 111 mmol/L  CO2 25 22 - 32 mmol/L   Glucose, Bld 247 (H) 65 - 99 mg/dL   BUN 12 6 - 20 mg/dL   Creatinine, Ser 1.17 0.61 - 1.24 mg/dL   Calcium 9.2 8.9 - 10.3 mg/dL   Total Protein 8.2 (H) 6.5 - 8.1 g/dL   Albumin 2.7 (L) 3.5 - 5.0 g/dL   AST 17 15 - 41 U/L   ALT 19 17 - 63 U/L   Alkaline Phosphatase 71 38 - 126 U/L   Total Bilirubin 0.2 (L) 0.3 - 1.2 mg/dL   GFR calc non Af Amer >60 >60 mL/min   GFR calc Af Amer >60 >60 mL/min   Anion gap 9 5 - 15  CBC with Differential  Result Value Ref Range   WBC 11.3 (H) 4.0 - 10.5 K/uL   RBC 2.98 (L) 4.22 - 5.81 MIL/uL   Hemoglobin 8.0 (L) 13.0 - 17.0 g/dL   HCT 26.6 (L) 39.0 - 52.0 %   MCV 89.3 78.0 - 100.0 fL   MCH 26.8 26.0 - 34.0 pg   MCHC 30.1 30.0 - 36.0 g/dL   RDW 14.7 11.5 - 15.5 %   Platelets 387 150 - 400 K/uL   Neutrophils Relative % 71 %   Neutro Abs 8.1 (H) 1.7 - 7.7 K/uL   Lymphocytes Relative 19 %   Lymphs Abs 2.1 0.7 - 4.0 K/uL   Monocytes Relative 8 %   Monocytes Absolute 0.9 0.1 - 1.0 K/uL   Eosinophils Relative 2 %   Eosinophils Absolute 0.2 0.0 - 0.7 K/uL   Basophils Relative 0 %   Basophils Absolute 0.0 0.0 - 0.1 K/uL  CBG monitoring, ED  Result Value Ref Range   Glucose-Capillary 250 (H) 65 - 99 mg/dL   Comment 1 Notify RN    US Renal  07/27/2015  CLINICAL DATA:  Acute kidney injury EXAM: RENAL / URINARY TRACT ULTRASOUND COMPLETE COMPARISON:  None. FINDINGS: Right Kidney: Length: 12.6 cm. Mildly increased echogenicity. No hydronephrosis or mass. Left Kidney: Length: 12.6 cm. Mildly increased echogenicity. No hydronephrosis or mass. Bladder: Not evaluated.  Decompressed by Foley catheter. IMPRESSION: Evidence of medical renal  disease Electronically Signed   By: Skipper Cliche M.D.   On: 07/27/2015 11:21   US Arterial Seg Multiple  07/19/2015  CLINICAL DATA:  65 year old male with left foot cellulitis and pain. EXAM: NONINVASIVE PHYSIOLOGIC VASCULAR STUDY OF BILATERAL LOWER EXTREMITIES TECHNIQUE: Evaluation of both lower extremities was performed at rest, including calculation of ankle-brachial indices, multiple segmental pressure evaluation, segmental Doppler and segmental pulse volume recording. COMPARISON:  None. FINDINGS: Right ABI:  0.91 Left ABI:  0.45 Right Lower Extremity: Significant pressure differential between the low thigh and calf cuffs suggesting distal femoral popliteal disease. Preserved digital waveforms on PVRs. Left Lower Extremity: Significant pressure gradient between the low thigh cuff and calf cuff consistent with distal femoral popliteal disease. Additionally, there are significant pressure gradients between the calf and ankle consistent with runoff disease. IMPRESSION: 1. Resting right ankle brachial index of 0.91 consistent with at least mild peripheral arterial disease. Segmental pressures, arterial waveforms and PVRs suggest femoral popliteal disease. 2. Resting left ankle brachial index of 0.45 consistent with advanced peripheral arterial disease. Segmental pressures, arterial waveforms and PVRs suggest both femoral popliteal disease and runoff disease. Given clinical history of left foot wound and rest pain these findings are concerning for critical limb ischemia (CLI). Recommend referral to vascular Interventional Radiology or other endovascular specialist for further evaluation and management.  Signed, Criselda Peaches, MD [facility] Electronically Signed   By: Jacqulynn Cadet M.D.   On: 07/19/2015 16:22   Dg Foot 2 Views Left  07/24/2015  CLINICAL DATA:  Cellulitis with wound on first toe EXAM: LEFT FOOT - 2 VIEW COMPARISON:  July 18, 2015 FINDINGS: Frontal and lateral views were obtained. There is no demonstrable fracture or dislocation. The joint spaces appear normal. No erosive change or bony destruction. There is soft tissue swelling over the dorsum of the foot. There is no soft tissue air or radiopaque foreign body. IMPRESSION: No bony destruction or erosion. No fracture or dislocation. Soft tissue swelling over the dorsal foot. No soft tissue abscess or radiopaque foreign body evident. Electronically Signed   By: Lowella Grip III M.D.   On: 07/24/2015 07:23   Dg Foot Complete Left  07/18/2015  CLINICAL DATA:  Left foot pain with ulcer along the great toe and fourth toe for 2 weeks. Diabetic. EXAM: LEFT FOOT - COMPLETE 3+ VIEW COMPARISON:  06/29/2015 prior exams FINDINGS: There is no evidence of acute fracture, subluxation or dislocation. No radiographic evidence of osteomyelitis identified. No radiopaque foreign bodies are present. Vascular calcifications are again noted. IMPRESSION: No acute bony abnormalities. Electronically Signed   By: Margarette Canada M.D.   On: 07/18/2015 14:21     Medications  vancomycin (VANCOCIN) IVPB 1000 mg/200 mL premix (not administered)  piperacillin-tazobactam (ZOSYN) IVPB 3.375 g (not administered)    Will admit to medicine for gangrene of the foot in a diabetic  I personally performed the services described in this documentation, which was scribed in my presence. The recorded information has been reviewed and is accurate.      Veatrice Kells, MD 08/15/15 0048  Saketh Daubert, MD 08/15/15 [id number]  Veatrice Kells, MD 08/15/15 [phone]

## 2015-08-15 ENCOUNTER — Encounter (HOSPITAL_COMMUNITY): Payer: Self-pay | Admitting: Emergency Medicine

## 2015-08-15 DIAGNOSIS — E11621 Type 2 diabetes mellitus with foot ulcer: Secondary | ICD-10-CM | POA: Diagnosis present

## 2015-08-15 DIAGNOSIS — L97529 Non-pressure chronic ulcer of other part of left foot with unspecified severity: Secondary | ICD-10-CM | POA: Diagnosis present

## 2015-08-15 DIAGNOSIS — J45909 Unspecified asthma, uncomplicated: Secondary | ICD-10-CM | POA: Diagnosis present

## 2015-08-15 DIAGNOSIS — I10 Essential (primary) hypertension: Secondary | ICD-10-CM | POA: Diagnosis not present

## 2015-08-15 DIAGNOSIS — I739 Peripheral vascular disease, unspecified: Secondary | ICD-10-CM | POA: Diagnosis not present

## 2015-08-15 DIAGNOSIS — Z7982 Long term (current) use of aspirin: Secondary | ICD-10-CM | POA: Diagnosis not present

## 2015-08-15 DIAGNOSIS — E1122 Type 2 diabetes mellitus with diabetic chronic kidney disease: Secondary | ICD-10-CM | POA: Diagnosis present

## 2015-08-15 DIAGNOSIS — N182 Chronic kidney disease, stage 2 (mild): Secondary | ICD-10-CM

## 2015-08-15 DIAGNOSIS — E785 Hyperlipidemia, unspecified: Secondary | ICD-10-CM | POA: Diagnosis present

## 2015-08-15 DIAGNOSIS — F17210 Nicotine dependence, cigarettes, uncomplicated: Secondary | ICD-10-CM | POA: Diagnosis present

## 2015-08-15 DIAGNOSIS — R6510 Systemic inflammatory response syndrome (SIRS) of non-infectious origin without acute organ dysfunction: Secondary | ICD-10-CM | POA: Diagnosis present

## 2015-08-15 DIAGNOSIS — Z79899 Other long term (current) drug therapy: Secondary | ICD-10-CM | POA: Diagnosis not present

## 2015-08-15 DIAGNOSIS — K5900 Constipation, unspecified: Secondary | ICD-10-CM | POA: Diagnosis present

## 2015-08-15 DIAGNOSIS — D62 Acute posthemorrhagic anemia: Secondary | ICD-10-CM | POA: Diagnosis not present

## 2015-08-15 DIAGNOSIS — E1151 Type 2 diabetes mellitus with diabetic peripheral angiopathy without gangrene: Secondary | ICD-10-CM | POA: Diagnosis not present

## 2015-08-15 DIAGNOSIS — E875 Hyperkalemia: Secondary | ICD-10-CM | POA: Diagnosis not present

## 2015-08-15 DIAGNOSIS — I96 Gangrene, not elsewhere classified: Secondary | ICD-10-CM

## 2015-08-15 DIAGNOSIS — R509 Fever, unspecified: Secondary | ICD-10-CM | POA: Diagnosis not present

## 2015-08-15 DIAGNOSIS — N400 Enlarged prostate without lower urinary tract symptoms: Secondary | ICD-10-CM | POA: Diagnosis present

## 2015-08-15 DIAGNOSIS — E1152 Type 2 diabetes mellitus with diabetic peripheral angiopathy with gangrene: Secondary | ICD-10-CM | POA: Diagnosis present

## 2015-08-15 DIAGNOSIS — M79672 Pain in left foot: Secondary | ICD-10-CM | POA: Diagnosis present

## 2015-08-15 DIAGNOSIS — B182 Chronic viral hepatitis C: Secondary | ICD-10-CM | POA: Diagnosis present

## 2015-08-15 DIAGNOSIS — Z7984 Long term (current) use of oral hypoglycemic drugs: Secondary | ICD-10-CM | POA: Diagnosis not present

## 2015-08-15 DIAGNOSIS — E871 Hypo-osmolality and hyponatremia: Secondary | ICD-10-CM | POA: Diagnosis present

## 2015-08-15 DIAGNOSIS — N189 Chronic kidney disease, unspecified: Secondary | ICD-10-CM | POA: Diagnosis not present

## 2015-08-15 DIAGNOSIS — D509 Iron deficiency anemia, unspecified: Secondary | ICD-10-CM | POA: Diagnosis present

## 2015-08-15 DIAGNOSIS — E1142 Type 2 diabetes mellitus with diabetic polyneuropathy: Secondary | ICD-10-CM | POA: Diagnosis present

## 2015-08-15 DIAGNOSIS — E1159 Type 2 diabetes mellitus with other circulatory complications: Secondary | ICD-10-CM | POA: Diagnosis not present

## 2015-08-15 DIAGNOSIS — Z8673 Personal history of transient ischemic attack (TIA), and cerebral infarction without residual deficits: Secondary | ICD-10-CM | POA: Diagnosis not present

## 2015-08-15 DIAGNOSIS — N183 Chronic kidney disease, stage 3 (moderate): Secondary | ICD-10-CM | POA: Diagnosis present

## 2015-08-15 DIAGNOSIS — I129 Hypertensive chronic kidney disease with stage 1 through stage 4 chronic kidney disease, or unspecified chronic kidney disease: Secondary | ICD-10-CM | POA: Diagnosis present

## 2015-08-15 DIAGNOSIS — F329 Major depressive disorder, single episode, unspecified: Secondary | ICD-10-CM | POA: Diagnosis not present

## 2015-08-15 DIAGNOSIS — Z4781 Encounter for orthopedic aftercare following surgical amputation: Secondary | ICD-10-CM | POA: Diagnosis not present

## 2015-08-15 DIAGNOSIS — Z981 Arthrodesis status: Secondary | ICD-10-CM | POA: Diagnosis not present

## 2015-08-15 DIAGNOSIS — L97509 Non-pressure chronic ulcer of other part of unspecified foot with unspecified severity: Secondary | ICD-10-CM | POA: Diagnosis not present

## 2015-08-15 DIAGNOSIS — D649 Anemia, unspecified: Secondary | ICD-10-CM | POA: Diagnosis not present

## 2015-08-15 DIAGNOSIS — Z89512 Acquired absence of left leg below knee: Secondary | ICD-10-CM | POA: Diagnosis not present

## 2015-08-15 DIAGNOSIS — L03116 Cellulitis of left lower limb: Secondary | ICD-10-CM | POA: Diagnosis present

## 2015-08-15 LAB — COMPREHENSIVE METABOLIC PANEL
ALT: 17 U/L (ref 17–63)
AST: 13 U/L — AB (ref 15–41)
Albumin: 2.5 g/dL — ABNORMAL LOW (ref 3.5–5.0)
Alkaline Phosphatase: 51 U/L (ref 38–126)
Anion gap: 7 (ref 5–15)
BILIRUBIN TOTAL: 0.6 mg/dL (ref 0.3–1.2)
BUN: 11 mg/dL (ref 6–20)
CHLORIDE: 102 mmol/L (ref 101–111)
CO2: 27 mmol/L (ref 22–32)
Calcium: 9.1 mg/dL (ref 8.9–10.3)
Creatinine, Ser: 1.1 mg/dL (ref 0.61–1.24)
GFR calc Af Amer: >60 mL/min (ref >60)
GLUCOSE: 125 mg/dL — AB (ref 65–99)
Potassium: 3.5 mmol/L (ref 3.5–5.1)
Sodium: 136 mmol/L (ref 135–145)
Total Protein: 7.8 g/dL (ref 6.5–8.1)

## 2015-08-15 LAB — GLUCOSE, CAPILLARY
GLUCOSE-CAPILLARY: 110 mg/dL — AB (ref 65–99)
GLUCOSE-CAPILLARY: 252 mg/dL — AB (ref 65–99)
GLUCOSE-CAPILLARY: 291 mg/dL — AB (ref 65–99)
Glucose-Capillary: 170 mg/dL — ABNORMAL HIGH (ref 65–99)
Glucose-Capillary: 200 mg/dL — ABNORMAL HIGH (ref 65–99)

## 2015-08-15 LAB — PHOSPHORUS: Phosphorus: 4.1 mg/dL (ref 2.5–4.6)

## 2015-08-15 LAB — CBC
HEMATOCRIT: 25 % — AB (ref 39.0–52.0)
HEMOGLOBIN: 7.5 g/dL — AB (ref 13.0–17.0)
MCH: 26.5 pg (ref 26.0–34.0)
MCHC: 30 g/dL (ref 30.0–36.0)
MCV: 88.3 fL (ref 78.0–100.0)
Platelets: 357 10*3/uL (ref 150–400)
RBC: 2.83 MIL/uL — AB (ref 4.22–5.81)
RDW: 14.6 % (ref 11.5–15.5)
WBC: 9.4 10*3/uL (ref 4.0–10.5)

## 2015-08-15 LAB — TSH: TSH: 2.687 u[IU]/mL (ref 0.350–4.500)

## 2015-08-15 LAB — SURGICAL PCR SCREEN
MRSA, PCR: NEGATIVE
Staphylococcus aureus: NEGATIVE

## 2015-08-15 LAB — MAGNESIUM: Magnesium: 1.6 mg/dL — ABNORMAL LOW (ref 1.7–2.4)

## 2015-08-15 MED ORDER — TAMSULOSIN HCL 0.4 MG PO CAPS
0.4000 mg | ORAL_CAPSULE | Freq: Every day | ORAL | Status: DC
  Administered 2015-08-15 – 2015-08-21 (×6): 0.4 mg via ORAL
  Filled 2015-08-15 (×6): qty 1

## 2015-08-15 MED ORDER — PIPERACILLIN-TAZOBACTAM 3.375 G IVPB
3.3750 g | Freq: Three times a day (TID) | INTRAVENOUS | Status: AC
  Administered 2015-08-15 – 2015-08-18 (×10): 3.375 g via INTRAVENOUS
  Filled 2015-08-15 (×11): qty 50

## 2015-08-15 MED ORDER — SIMVASTATIN 20 MG PO TABS
20.0000 mg | ORAL_TABLET | Freq: Every day | ORAL | Status: DC
  Administered 2015-08-15 – 2015-08-16 (×2): 20 mg via ORAL
  Filled 2015-08-15 (×2): qty 1

## 2015-08-15 MED ORDER — OXYCODONE-ACETAMINOPHEN 5-325 MG PO TABS
2.0000 | ORAL_TABLET | Freq: Four times a day (QID) | ORAL | Status: DC | PRN
  Administered 2015-08-15 – 2015-08-16 (×6): 2 via ORAL
  Filled 2015-08-15 (×5): qty 2

## 2015-08-15 MED ORDER — PIPERACILLIN-TAZOBACTAM 3.375 G IVPB 30 MIN
3.3750 g | Freq: Once | INTRAVENOUS | Status: AC
  Administered 2015-08-15: 3.375 g via INTRAVENOUS
  Filled 2015-08-15: qty 50

## 2015-08-15 MED ORDER — LISINOPRIL-HYDROCHLOROTHIAZIDE 20-12.5 MG PO TABS: 1.0000 | ORAL_TABLET | Freq: Two times a day (BID) | ORAL | Status: DC

## 2015-08-15 MED ORDER — HYDROCHLOROTHIAZIDE 12.5 MG PO CAPS: 12.5000 mg | ORAL_CAPSULE | Freq: Two times a day (BID) | ORAL | Status: DC

## 2015-08-15 MED ORDER — ACETAMINOPHEN 325 MG PO TABS
650.0000 mg | ORAL_TABLET | Freq: Four times a day (QID) | ORAL | Status: DC | PRN
  Administered 2015-08-16: 650 mg via ORAL
  Filled 2015-08-15 (×2): qty 2

## 2015-08-15 MED ORDER — LISINOPRIL 20 MG PO TABS: 20.0000 mg | ORAL_TABLET | Freq: Two times a day (BID) | ORAL | Status: DC

## 2015-08-15 MED ORDER — VANCOMYCIN HCL IN DEXTROSE 1-5 GM/200ML-% IV SOLN
1000.0000 mg | Freq: Two times a day (BID) | INTRAVENOUS | Status: AC
  Administered 2015-08-15 – 2015-08-18 (×7): 1000 mg via INTRAVENOUS
  Filled 2015-08-15 (×7): qty 200

## 2015-08-15 MED ORDER — SODIUM CHLORIDE 0.9 % IV SOLN
INTRAVENOUS | Status: AC
  Administered 2015-08-15: 06:00:00 via INTRAVENOUS

## 2015-08-15 MED ORDER — ACETAMINOPHEN 650 MG RE SUPP: 650.0000 mg | Freq: Four times a day (QID) | RECTAL | Status: DC | PRN

## 2015-08-15 MED ORDER — INSULIN ASPART 100 UNIT/ML ~~LOC~~ SOLN
0.0000 [IU] | SUBCUTANEOUS | Status: DC
  Administered 2015-08-15 (×2): 2 [IU] via SUBCUTANEOUS
  Administered 2015-08-15 (×2): 5 [IU] via SUBCUTANEOUS
  Administered 2015-08-16 (×2): 2 [IU] via SUBCUTANEOUS
  Administered 2015-08-16: 1 [IU] via SUBCUTANEOUS

## 2015-08-15 MED ORDER — ONDANSETRON HCL 4 MG PO TABS
4.0000 mg | ORAL_TABLET | Freq: Four times a day (QID) | ORAL | Status: DC | PRN
Start: 2015-08-15 — End: 2015-08-21

## 2015-08-15 MED ORDER — LISINOPRIL 20 MG PO TABS
20.0000 mg | ORAL_TABLET | Freq: Two times a day (BID) | ORAL | Status: DC
  Administered 2015-08-15 – 2015-08-21 (×12): 20 mg via ORAL
  Filled 2015-08-15 (×13): qty 1

## 2015-08-15 MED ORDER — CARVEDILOL 3.125 MG PO TABS
3.1250 mg | ORAL_TABLET | Freq: Two times a day (BID) | ORAL | Status: DC
  Administered 2015-08-15 – 2015-08-21 (×12): 3.125 mg via ORAL
  Filled 2015-08-15 (×13): qty 1

## 2015-08-15 MED ORDER — ONDANSETRON HCL 4 MG/2ML IJ SOLN
4.0000 mg | Freq: Four times a day (QID) | INTRAMUSCULAR | Status: DC | PRN
  Administered 2015-08-16: 4 mg via INTRAVENOUS

## 2015-08-15 MED ORDER — GABAPENTIN 300 MG PO CAPS
300.0000 mg | ORAL_CAPSULE | Freq: Two times a day (BID) | ORAL | Status: DC
  Administered 2015-08-15 – 2015-08-21 (×12): 300 mg via ORAL
  Filled 2015-08-15 (×13): qty 1

## 2015-08-15 MED ORDER — GABAPENTIN 300 MG PO CAPS: 300.0000 mg | ORAL_CAPSULE | Freq: Two times a day (BID) | ORAL | Status: DC

## 2015-08-15 MED ORDER — VITAMIN B-1 100 MG PO TABS
100.0000 mg | ORAL_TABLET | Freq: Every day | ORAL | Status: DC
  Administered 2015-08-15 – 2015-08-21 (×6): 100 mg via ORAL
  Filled 2015-08-15 (×6): qty 1

## 2015-08-15 MED ORDER — FAMOTIDINE 20 MG PO TABS
20.0000 mg | ORAL_TABLET | Freq: Every day | ORAL | Status: DC
  Administered 2015-08-15 – 2015-08-21 (×6): 20 mg via ORAL
  Filled 2015-08-15 (×6): qty 1

## 2015-08-15 MED ORDER — VANCOMYCIN HCL IN DEXTROSE 1-5 GM/200ML-% IV SOLN
1000.0000 mg | Freq: Once | INTRAVENOUS | Status: AC
  Administered 2015-08-15: 1000 mg via INTRAVENOUS
  Filled 2015-08-15: qty 200

## 2015-08-15 MED ORDER — MORPHINE SULFATE (PF) 2 MG/ML IV SOLN
1.0000 mg | INTRAVENOUS | Status: DC | PRN
  Administered 2015-08-15 – 2015-08-18 (×7): 1 mg via INTRAVENOUS
  Filled 2015-08-15 (×7): qty 1

## 2015-08-15 MED ORDER — HYDROCHLOROTHIAZIDE 12.5 MG PO CAPS
12.5000 mg | ORAL_CAPSULE | Freq: Two times a day (BID) | ORAL | Status: DC
  Administered 2015-08-15 – 2015-08-21 (×12): 12.5 mg via ORAL
  Filled 2015-08-15 (×13): qty 1

## 2015-08-15 MED ORDER — ENSURE ENLIVE PO LIQD
237.0000 mL | Freq: Two times a day (BID) | ORAL | Status: DC
  Administered 2015-08-15 – 2015-08-17 (×3): 237 mL via ORAL

## 2015-08-15 NOTE — Progress Notes (Signed)
Patient seen and examined this morning, admitted overnight by Dr. Roel Cluck, H&P reviewed and agree with A/P  In brief, 65 yo M with history of HTN, PVD, DM2, CKD III, recently hospitalized for LLE cellulitis / wounds, d/c on 6/29 (of note, he refused SNF), admitted again with LLE wound / wet gangrene worsening and now with maggots per [facility] RN.  BP 120/85 mmHg  Pulse 88  Temp(Src) 98.2 F (36.8 C) (Oral)  Resp 17  Ht 6\' 1"  (1.854 m)  Wt 90.3 kg (199 lb 1.2 oz)  BMI 26.27 kg/m2  SpO2 100%  Gen: NAD Pulm: CTA biL CV: RRR, S1, S2 MSK: Necrotic toes, large wound on lateral left foot (see picture in H&P), foul odor  Plan:  Wet Gangrene /cellulitis  - he was recently evaluated by vascular surgery and Dr. Trula Slade did balloon angioplasty, left popliteal artery, balloon angioplasty, left tibioperoneal trunk and failed balloon angioplasty, left posterior tibial artery - continue IV antibiotics for now - Vascular Surgery consulted this morning, discussed with Dr. Oneida Alar.  - foot is non salvageable, plan for BKA 7/5  Diabetes mellitus with foot ulcer, without long-term current use of insulin (HCC) - most recent A1C 7.0 on 07/24/15 - on Metformin at home, SSI here  Essential hypertension  - continue home medications  Peripheral vascular disease  - vascular surgery consulted  Chronic kidney disease, stage 3  - currently creatinine better than baseline will avoid nephrotoxic medications  Anemia  - patient have recently required transfusions due to iron deficiency anemia will avoid NSAID use. Obtain type and screen transfuse for hemoglobin below 7   Costin M. Cruzita Lederer, MD [facility] [phone]

## 2015-08-15 NOTE — Consult Note (Signed)
Referring Physician: Dr Roel Cluck Patient name: Cory Weber MRN: [id number] DOB: 06/16/1950 Sex: male  REASON FOR CONSULT: gangrene left foot  HPI: Cory Weber is a 65 y.o. male, s/p left popliteal angioplasty by Dr Trula Slade 1 week ago for left foot wound.  Pt readmitted yesterday due to worsening of wound and maggots.  Pt states foot does hurt but feels better than before procedure. He denies fever/chills/nausea/vomiting.  Other medical problems include DM, hypertension, hep C, neuropathy.  All currently stable  Past Medical History  Diagnosis Date  . Diabetes mellitus   . Hypertension   . Hepatitis C     states he was diagnosed in 2007 or 2007 while living in [place], [place]  . CVA (cerebral infarction) 01/2011  . Hyperlipidemia   . Asthma   . PVD (peripheral vascular disease) ([place])   . Anemia   . Stroke ([place])   . Headache(784.0)   . Peripheral neuropathy ([place])   . Cellulitis and abscess of foot 07/2015   Past Surgical History  Procedure Laterality Date  . Liver biopsy  2005    Done in [place], [place]. Chronic hepatitis with mild periportal inflammation, lobular unicellular necrosis and portal fibrosis. Grade 2, stage 1-2.  Marland Kitchen Colonoscopy with propofol N/A 09/03/2012    [id number] polyp-removed as outlined above. Prominent internal hemorrhoids. Tubular adenoma  . Esophagogastroduodenoscopy (egd) with propofol N/A 09/03/2012    [id number] hernia. Gastric diverticulum. Gastric ulcers with associated erosions. Duodenal erosions. Status post gastric biopsy. H.PYLORI gastritis   . Biopsy N/A 09/03/2012    Procedure: BIOPSY;  Surgeon: Daneil Dolin, MD;  Location: [facility];  Service: Endoscopy;  Laterality: N/A;  gastric and gastric mucosa  . Polypectomy N/A 09/03/2012    Procedure: POLYPECTOMY;  Surgeon: Daneil Dolin, MD;  Location: [facility];  Service: Endoscopy;  Laterality: N/A;  cecal polyp  . Esophagogastroduodenoscopy (egd) with propofol N/A 12/03/2012    Dr. Gala Romney: gastric  diverticulum, gastric erosions and scar. Previously noted gastric ulcer completed healed. Biopsy without H.pylori.   . Biopsy N/A 12/03/2012    Procedure: BIOPSY;  Surgeon: Daneil Dolin, MD;  Location: [facility];  Service: Endoscopy;  Laterality: N/A;  . Maximum access (mas)posterior lumbar interbody fusion (plif) 1 level N/A 11/25/2013    Procedure: FOR MAXIMUM ACCESS (MAS) POSTERIOR LUMBAR INTERBODY FUSION (PLIF) 1 LEVEL;  Surgeon: Eustace Moore, MD;  Location: [facility];  Service: Neurosurgery;  Laterality: N/A;  FOR MAXIMUM ACCESS (MAS) POSTERIOR LUMBAR INTERBODY FUSION (PLIF) 1 LEVEL LUMBAR 3-4  . Peripheral vascular catheterization Left 08/01/2015    Procedure: Lower Extremity Angiography;  Surgeon: Serafina Mitchell, MD;  Location: [facility];  Service: Cardiovascular;  Laterality: Left;  . Peripheral vascular catheterization N/A 08/01/2015    Procedure: Abdominal Aortogram;  Surgeon: Serafina Mitchell, MD;  Location: [facility];  Service: Cardiovascular;  Laterality: N/A;  . Peripheral vascular catheterization N/A 08/08/2015    Procedure: Abdominal Aortogram w/Lower Extremity;  Surgeon: Serafina Mitchell, MD;  Location: [facility];  Service: Cardiovascular;  Laterality: N/A;  . Peripheral vascular catheterization Left 08/08/2015    Procedure: Peripheral Vascular Balloon Angioplasty;  Surgeon: Serafina Mitchell, MD;  Location: [facility];  Service: Cardiovascular;  Laterality: Left;  left popiteal artery, left peronealtrunk, left post tibial    Family History  Problem Relation Age of Onset  . Breast cancer Mother     deceased  . Cancer Mother   .  Diabetes Father   . Hypertension Father   . Heart disease Father     deceased  . Hyperlipidemia Father   . Diabetes Sister   . Hypertension Sister   . Breast cancer Sister   . Colon cancer Neg Hx   . Liver disease Neg Hx   . Heart failure Mother     SOCIAL HISTORY: Social History   Social History  . Marital  Status: Single    Spouse Name: N/A  . Number of Children: 1  . Years of Education: N/A   Occupational History  . trying to get disability    Social History Main Topics  . Smoking status: Current Some Day Smoker -- 0.50 packs/day for 20 years    Types: Cigarettes  . Smokeless tobacco: Never Used     Comment: given 1-800-Quit Now  . Alcohol Use: No  . Drug Use: No     Comment: last used April 2016.  Marland Kitchen Sexual Activity: Not on file   Other Topics Concern  . Not on file   Social History Narrative    No Known Allergies  Current Facility-Administered Medications  Medication Dose Route Frequency Provider Last Rate Last Dose  . 0.9 %  sodium chloride infusion   Intravenous Continuous Toy Baker, MD 100 mL/hr at 08/15/15 0559    . acetaminophen (TYLENOL) tablet 650 mg  650 mg Oral Q6H PRN Toy Baker, MD       Or  . acetaminophen (TYLENOL) suppository 650 mg  650 mg Rectal Q6H PRN Toy Baker, MD      . carvedilol (COREG) tablet 3.125 mg  3.125 mg Oral BID WC Toy Baker, MD   3.125 mg at 08/15/15 0818  . famotidine (PEPCID) tablet 20 mg  20 mg Oral Daily Toy Baker, MD   20 mg at 08/15/15 0819  . gabapentin (NEURONTIN) capsule 300 mg  300 mg Oral BID Toy Baker, MD   300 mg at 08/15/15 0352  . lisinopril (PRINIVIL,ZESTRIL) tablet 20 mg  20 mg Oral BID Toy Baker, MD   20 mg at 08/15/15 0352   And  . hydrochlorothiazide (MICROZIDE) capsule 12.5 mg  12.5 mg Oral BID Toy Baker, MD   12.5 mg at 08/15/15 0353  . insulin aspart (novoLOG) injection 0-9 Units  0-9 Units Subcutaneous Q4H Toy Baker, MD   2 Units at 08/15/15 0430  . morphine 2 MG/ML injection 1 mg  1 mg Intravenous Q3H PRN Toy Baker, MD   1 mg at 08/15/15 0816  . ondansetron (ZOFRAN) tablet 4 mg  4 mg Oral Q6H PRN Toy Baker, MD       Or  . ondansetron (ZOFRAN) injection 4 mg  4 mg Intravenous Q6H PRN Toy Baker, MD      .  oxyCODONE-acetaminophen (PERCOCET/ROXICET) 5-325 MG per tablet 2 tablet  2 tablet Oral Q6H PRN Toy Baker, MD   2 tablet at 08/15/15 0333  . piperacillin-tazobactam (ZOSYN) IVPB 3.375 g  3.375 g Intravenous Q8H Veronda P Bryk, RPH      . simvastatin (ZOCOR) tablet 20 mg  20 mg Oral q1800 Toy Baker, MD      . tamsulosin (FLOMAX) capsule 0.4 mg  0.4 mg Oral Daily Toy Baker, MD   0.4 mg at 08/15/15 0818  . thiamine (VITAMIN B-1) tablet 100 mg  100 mg Oral Daily Toy Baker, MD   100 mg at 08/15/15 0818  . vancomycin (VANCOCIN) IVPB 1000 mg/200 mL premix  1,000 mg Intravenous Q12H Veronda  P Bryk, RPH        ROS:   General:  No weight loss, Fever, chills  HEENT: No recent headaches, no nasal bleeding, no visual changes, no sore throat  Neurologic: No dizziness, blackouts, seizures. No recent symptoms of stroke or mini- stroke. No recent episodes of slurred speech, or temporary blindness.  Cardiac: No recent episodes of chest pain/pressure, no shortness of breath at rest.  No shortness of breath with exertion.  Denies history of atrial fibrillation or irregular heartbeat  Vascular: No history of rest pain in feet.  No history of claudication.  + history of non-healing ulcer, No history of DVT   Pulmonary: No home oxygen, no productive cough, no hemoptysis,  No asthma or wheezing  Musculoskeletal:  [ ]  Arthritis, [ ]  Low back pain,  [ ]  Joint pain  Hematologic:No history of hypercoagulable state.  No history of easy bleeding.  No history of anemia  Gastrointestinal: No hematochezia or melena,  No gastroesophageal reflux, no trouble swallowing  Urinary: [x ] chronic Kidney disease, [ ]  on HD - [ ]  MWF or [ ]  TTHS, [ ]  Burning with urination, [ ]  Frequent urination, [ ]  Difficulty urinating;   Skin: No rashes  Psychological: No history of anxiety,  No history of depression   Physical Examination  Filed Vitals:   08/15/15 0230 08/15/15 0253 08/15/15 0300  08/15/15 0815  BP: 143/87 154/94  120/85  Pulse: 84 97  88  Temp:  98 F (36.7 C)  98.2 F (36.8 C)  TempSrc:  Oral  Oral  Resp:    17  Height:   6\' 1"  (1.854 m)   Weight:   199 lb 1.2 oz (90.3 kg)   SpO2: 99% 100%  100%    Body mass index is 26.27 kg/(m^2).  General:  Alert and oriented, no acute distress HEENT: Normal Neck: No bruit or JVD Pulmonary: Clear to auscultation bilaterally Cardiac: Regular Rate and Rhythm without murmur Abdomen: Soft, non-tender, non-distended, no mass Skin: No rash wet gangrene entire forefoot with slough of skin and scattered maggots between toes Extremity Pulses:  2+ radial, brachial, femoral, absent dorsalis pedis, posterior tibial pulses bilaterally Musculoskeletal: left foot edema  Neurologic: Upper and lower extremity motor 5/5 and symmetric  DATA:  CBC    Component Value Date/Time   WBC 9.4 08/15/2015 0615   RBC 2.83* 08/15/2015 0615   RBC 4.06* 11/24/2013 0335   HGB 7.5* 08/15/2015 0615   HCT 25.0* 08/15/2015 0615   HCT 32 10/31/2011 1319   PLT 357 08/15/2015 0615   MCV 88.3 08/15/2015 0615   MCV 82.8 10/31/2011 1319   MCH 26.5 08/15/2015 0615   MCHC 30.0 08/15/2015 0615   RDW 14.6 08/15/2015 0615   LYMPHSABS 2.1 08/14/2015 2019   MONOABS 0.9 08/14/2015 2019   EOSABS 0.2 08/14/2015 2019   BASOSABS 0.0 08/14/2015 2019    BMET    Component Value Date/Time   NA 136 08/15/2015 0615   NA 140 10/31/2011 1319   K 3.5 08/15/2015 0615   K 4.0 10/31/2011 1319   CL 102 08/15/2015 0615   CO2 27 08/15/2015 0615   GLUCOSE 125* 08/15/2015 0615   BUN 11 08/15/2015 0615   BUN 10 10/31/2011 1319   CREATININE 1.10 08/15/2015 0615   CREATININE 0.99 10/31/2011 1319   CALCIUM 9.1 08/15/2015 0615   CALCIUM 9.0 10/31/2011 1319   GFRNONAA >60 08/15/2015 0615   GFRAA >60 08/15/2015 0615    ASSESSMENT:  Non  salvageable left foot   PLAN:  Left BKA tomorrow by Dr Trula Slade NPO p midnight Consent Ok to eat today   Ruta Hinds,  MD [facility] Office: [phone] Pager: [phone]

## 2015-08-15 NOTE — Progress Notes (Signed)
Pharmacy Antibiotic Note  Cory Weber is a 65 y.o. male admitted on 08/14/2015 with cellulitis.  Pharmacy has been consulted for Vancocin and Zosyn dosing.  Plan: Vancomycin 1000mg  IV every 12 hours.  Goal trough 10-15 mcg/mL. Zosyn 3.375g IV q8h (4 hour infusion).  Height: 6\' 1"  (185.4 cm) Weight: 199 lb 1.2 oz (90.3 kg) IBW/kg (Calculated) : 79.9  Temp (24hrs), Avg:98.1 F (36.7 C), Min:98 F (36.7 C), Max:98.1 F (36.7 C)   Recent Labs Lab 08/09/15 0330 08/10/15 0228 08/14/15 2019  WBC 10.3 8.8 11.3*  CREATININE 1.40* 1.34* 1.17    Estimated Creatinine Clearance: 74 mL/min (by C-G formula based on Cr of 1.17).    No Known Allergies   Thank you for allowing pharmacy to be a part of this patient's care.  Wynona Neat, PharmD, BCPS  08/15/2015 3:02 AM
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
## 2015-08-16 ENCOUNTER — Inpatient Hospital Stay (HOSPITAL_COMMUNITY): Payer: Medicaid Other | Admitting: Certified Registered"

## 2015-08-16 ENCOUNTER — Encounter (HOSPITAL_COMMUNITY): Payer: Self-pay | Admitting: Certified Registered"

## 2015-08-16 ENCOUNTER — Encounter (HOSPITAL_COMMUNITY)
Admission: EM | Disposition: A | Discharge status: Rehabilitation Facility | Payer: Self-pay | Source: Home / Self Care | Attending: Family Medicine

## 2015-08-16 HISTORY — PX: BELOW KNEE LEG AMPUTATION: SUR23

## 2015-08-16 HISTORY — PX: AMPUTATION: SHX166

## 2015-08-16 LAB — CBC
HCT: 26 % — ABNORMAL LOW (ref 39.0–52.0)
Hemoglobin: 8 g/dL — ABNORMAL LOW (ref 13.0–17.0)
MCH: 27.2 pg (ref 26.0–34.0)
MCHC: 30.8 g/dL (ref 30.0–36.0)
MCV: 88.4 fL (ref 78.0–100.0)
Platelets: 365 10*3/uL (ref 150–400)
RBC: 2.94 MIL/uL — ABNORMAL LOW (ref 4.22–5.81)
RDW: 14.4 % (ref 11.5–15.5)
WBC: 11 10*3/uL — ABNORMAL HIGH (ref 4.0–10.5)

## 2015-08-16 LAB — GLUCOSE, CAPILLARY
GLUCOSE-CAPILLARY: 138 mg/dL — AB (ref 65–99)
GLUCOSE-CAPILLARY: 163 mg/dL — AB (ref 65–99)
GLUCOSE-CAPILLARY: 267 mg/dL — AB (ref 65–99)
Glucose-Capillary: 161 mg/dL — ABNORMAL HIGH (ref 65–99)
Glucose-Capillary: 164 mg/dL — ABNORMAL HIGH (ref 65–99)
Glucose-Capillary: 174 mg/dL — ABNORMAL HIGH (ref 65–99)
Glucose-Capillary: 183 mg/dL — ABNORMAL HIGH (ref 65–99)

## 2015-08-16 LAB — HIV ANTIBODY (ROUTINE TESTING W REFLEX): HIV SCREEN 4TH GENERATION: NONREACTIVE

## 2015-08-16 SURGERY — AMPUTATION BELOW KNEE
Anesthesia: General | Site: Leg Lower | Laterality: Left

## 2015-08-16 MED ORDER — MIDAZOLAM HCL 2 MG/2ML IJ SOLN
INTRAMUSCULAR | Status: AC
  Filled 2015-08-16: qty 2

## 2015-08-16 MED ORDER — INSULIN ASPART 100 UNIT/ML ~~LOC~~ SOLN
0.0000 [IU] | Freq: Three times a day (TID) | SUBCUTANEOUS | Status: DC
  Administered 2015-08-16 – 2015-08-17 (×2): 5 [IU] via SUBCUTANEOUS
  Administered 2015-08-17: 2 [IU] via SUBCUTANEOUS
  Administered 2015-08-17 – 2015-08-18 (×2): 3 [IU] via SUBCUTANEOUS
  Administered 2015-08-18 (×2): 2 [IU] via SUBCUTANEOUS
  Administered 2015-08-19: 3 [IU] via SUBCUTANEOUS
  Administered 2015-08-19 (×2): 2 [IU] via SUBCUTANEOUS
  Administered 2015-08-20: 1 [IU] via SUBCUTANEOUS
  Administered 2015-08-20 – 2015-08-21 (×3): 2 [IU] via SUBCUTANEOUS
  Administered 2015-08-21: 5 [IU] via SUBCUTANEOUS

## 2015-08-16 MED ORDER — OXYCODONE-ACETAMINOPHEN 5-325 MG PO TABS
ORAL_TABLET | ORAL | Status: AC
  Filled 2015-08-16: qty 2

## 2015-08-16 MED ORDER — PHENYLEPHRINE HCL 10 MG/ML IJ SOLN
10.0000 mg | INTRAVENOUS | Status: DC | PRN
  Administered 2015-08-16: 100 ug/min via INTRAVENOUS

## 2015-08-16 MED ORDER — FENTANYL CITRATE (PF) 250 MCG/5ML IJ SOLN
INTRAMUSCULAR | Status: DC | PRN
  Administered 2015-08-16: 50 ug via INTRAVENOUS
  Administered 2015-08-16: 100 ug via INTRAVENOUS

## 2015-08-16 MED ORDER — PROPOFOL 10 MG/ML IV BOLUS
INTRAVENOUS | Status: AC
  Filled 2015-08-16: qty 20

## 2015-08-16 MED ORDER — ACETAMINOPHEN 10 MG/ML IV SOLN
INTRAVENOUS | Status: AC
  Filled 2015-08-16: qty 100

## 2015-08-16 MED ORDER — SUGAMMADEX SODIUM 200 MG/2ML IV SOLN
INTRAVENOUS | Status: AC
  Filled 2015-08-16: qty 2

## 2015-08-16 MED ORDER — PROPOFOL 10 MG/ML IV BOLUS
INTRAVENOUS | Status: DC | PRN
  Administered 2015-08-16: 100 mg via INTRAVENOUS
  Administered 2015-08-16: 20 mg via INTRAVENOUS

## 2015-08-16 MED ORDER — KETAMINE HCL 100 MG/ML IJ SOLN
INTRAMUSCULAR | Status: AC
  Filled 2015-08-16: qty 1

## 2015-08-16 MED ORDER — ONDANSETRON HCL 4 MG/2ML IJ SOLN
INTRAMUSCULAR | Status: AC
  Filled 2015-08-16: qty 2

## 2015-08-16 MED ORDER — ACETAMINOPHEN 10 MG/ML IV SOLN
INTRAVENOUS | Status: DC | PRN
  Administered 2015-08-16: 1000 mg via INTRAVENOUS

## 2015-08-16 MED ORDER — PHENYLEPHRINE HCL 10 MG/ML IJ SOLN
INTRAMUSCULAR | Status: AC
  Filled 2015-08-16: qty 1

## 2015-08-16 MED ORDER — 0.9 % SODIUM CHLORIDE (POUR BTL) OPTIME
TOPICAL | Status: DC | PRN
  Administered 2015-08-16: 1000 mL

## 2015-08-16 MED ORDER — HYDROMORPHONE HCL 1 MG/ML IJ SOLN
INTRAMUSCULAR | Status: AC
  Administered 2015-08-16: 0.5 mg via INTRAVENOUS
  Filled 2015-08-16: qty 1

## 2015-08-16 MED ORDER — HYDROMORPHONE HCL 1 MG/ML IJ SOLN
0.2500 mg | INTRAMUSCULAR | Status: DC | PRN
  Administered 2015-08-16 (×4): 0.5 mg via INTRAVENOUS

## 2015-08-16 MED ORDER — KETAMINE HCL 100 MG/ML IJ SOLN
INTRAMUSCULAR | Status: DC | PRN
  Administered 2015-08-16: 50 mg via INTRAVENOUS

## 2015-08-16 MED ORDER — SODIUM CHLORIDE 0.9 % IV SOLN
INTRAVENOUS | Status: DC
  Administered 2015-08-16 – 2015-08-18 (×3): via INTRAVENOUS

## 2015-08-16 MED ORDER — ROCURONIUM BROMIDE 100 MG/10ML IV SOLN
INTRAVENOUS | Status: DC | PRN
  Administered 2015-08-16: 40 mg via INTRAVENOUS

## 2015-08-16 MED ORDER — PHENYLEPHRINE HCL 10 MG/ML IJ SOLN
INTRAMUSCULAR | Status: DC | PRN
  Administered 2015-08-16 (×5): 80 ug via INTRAVENOUS

## 2015-08-16 MED ORDER — FENTANYL CITRATE (PF) 250 MCG/5ML IJ SOLN
INTRAMUSCULAR | Status: AC
  Filled 2015-08-16: qty 5

## 2015-08-16 MED ORDER — LACTATED RINGERS IV SOLN
INTRAVENOUS | Status: DC | PRN
  Administered 2015-08-16: 08:00:00 via INTRAVENOUS

## 2015-08-16 MED ORDER — SUGAMMADEX SODIUM 200 MG/2ML IV SOLN
INTRAVENOUS | Status: DC | PRN
  Administered 2015-08-16: 200 mg via INTRAVENOUS

## 2015-08-16 MED ORDER — LIDOCAINE 2% (20 MG/ML) 5 ML SYRINGE
INTRAMUSCULAR | Status: DC | PRN
  Administered 2015-08-16: 60 mg via INTRAVENOUS

## 2015-08-16 MED ORDER — INSULIN GLARGINE 100 UNIT/ML ~~LOC~~ SOLN
8.0000 [IU] | Freq: Every day | SUBCUTANEOUS | Status: DC
  Administered 2015-08-16: 8 [IU] via SUBCUTANEOUS
  Filled 2015-08-16 (×3): qty 0.08

## 2015-08-16 MED ORDER — OXYCODONE-ACETAMINOPHEN 5-325 MG PO TABS
1.0000 | ORAL_TABLET | ORAL | Status: DC | PRN
  Administered 2015-08-16: 1 via ORAL
  Administered 2015-08-16 – 2015-08-21 (×19): 2 via ORAL
  Filled 2015-08-16 (×21): qty 2

## 2015-08-16 MED ORDER — ROCURONIUM BROMIDE 50 MG/5ML IV SOLN
INTRAVENOUS | Status: AC
  Filled 2015-08-16: qty 1

## 2015-08-16 MED ORDER — MIDAZOLAM HCL 5 MG/5ML IJ SOLN
INTRAMUSCULAR | Status: DC | PRN
  Administered 2015-08-16: 2 mg via INTRAVENOUS

## 2015-08-16 MED ORDER — HEPARIN SODIUM (PORCINE) 5000 UNIT/ML IJ SOLN
5000.0000 [IU] | Freq: Three times a day (TID) | INTRAMUSCULAR | Status: DC
  Administered 2015-08-17 – 2015-08-21 (×13): 5000 [IU] via SUBCUTANEOUS
  Filled 2015-08-16 (×14): qty 1

## 2015-08-16 SURGICAL SUPPLY — 60 items
BANDAGE ACE 6X5 VEL STRL LF (GAUZE/BANDAGES/DRESSINGS) ×3 IMPLANT
BANDAGE ELASTIC 4 VELCRO ST LF (GAUZE/BANDAGES/DRESSINGS) IMPLANT
BANDAGE ELASTIC 6 VELCRO ST LF (GAUZE/BANDAGES/DRESSINGS) IMPLANT
BANDAGE ESMARK 6X9 LF (GAUZE/BANDAGES/DRESSINGS) IMPLANT
BENZOIN TINCTURE PRP APPL 2/3 (GAUZE/BANDAGES/DRESSINGS) ×3 IMPLANT
BNDG COHESIVE 6X5 TAN STRL LF (GAUZE/BANDAGES/DRESSINGS) ×3 IMPLANT
BNDG ESMARK 6X9 LF (GAUZE/BANDAGES/DRESSINGS)
BNDG GAUZE ELAST 4 BULKY (GAUZE/BANDAGES/DRESSINGS) ×3 IMPLANT
CANISTER SUCTION 2500CC (MISCELLANEOUS) ×3 IMPLANT
CLIP TI MEDIUM 6 (CLIP) IMPLANT
CLOSURE WOUND 1/2 X4 (GAUZE/BANDAGES/DRESSINGS) ×1
COVER SURGICAL LIGHT HANDLE (MISCELLANEOUS) ×6 IMPLANT
CUFF TOURNIQUET SINGLE 34IN LL (TOURNIQUET CUFF) ×3 IMPLANT
CUFF TOURNIQUET SINGLE 44IN (TOURNIQUET CUFF) IMPLANT
DRAIN CHANNEL 19F RND (DRAIN) IMPLANT
DRAPE ORTHO SPLIT 77X108 STRL (DRAPES) ×4
DRAPE PROXIMA HALF (DRAPES) ×3 IMPLANT
DRAPE SURG ORHT 6 SPLT 77X108 (DRAPES) ×2 IMPLANT
DRSG ADAPTIC 3X8 NADH LF (GAUZE/BANDAGES/DRESSINGS) ×3 IMPLANT
ELECT REM PT RETURN 9FT ADLT (ELECTROSURGICAL) ×3
ELECTRODE REM PT RTRN 9FT ADLT (ELECTROSURGICAL) ×1 IMPLANT
EVACUATOR SILICONE 100CC (DRAIN) IMPLANT
GAUZE SPONGE 4X4 12PLY STRL (GAUZE/BANDAGES/DRESSINGS) ×3 IMPLANT
GAUZE SPONGE 4X4 16PLY XRAY LF (GAUZE/BANDAGES/DRESSINGS) ×3 IMPLANT
GLOVE BIOGEL PI IND STRL 6.5 (GLOVE) ×1 IMPLANT
GLOVE BIOGEL PI IND STRL 7.5 (GLOVE) ×1 IMPLANT
GLOVE BIOGEL PI INDICATOR 6.5 (GLOVE) ×2
GLOVE BIOGEL PI INDICATOR 7.5 (GLOVE) ×2
GLOVE ECLIPSE 6.5 STRL STRAW (GLOVE) ×3 IMPLANT
GLOVE SURG SS PI 7.5 STRL IVOR (GLOVE) ×3 IMPLANT
GOWN STRL REUS W/ TWL LRG LVL3 (GOWN DISPOSABLE) ×2 IMPLANT
GOWN STRL REUS W/ TWL XL LVL3 (GOWN DISPOSABLE) ×1 IMPLANT
GOWN STRL REUS W/TWL LRG LVL3 (GOWN DISPOSABLE) ×6
GOWN STRL REUS W/TWL XL LVL3 (GOWN DISPOSABLE) ×3
KIT BASIN OR (CUSTOM PROCEDURE TRAY) ×3 IMPLANT
KIT ROOM TURNOVER OR (KITS) ×3 IMPLANT
NS IRRIG 1000ML POUR BTL (IV SOLUTION) ×3 IMPLANT
PACK GENERAL/GYN (CUSTOM PROCEDURE TRAY) ×3 IMPLANT
PAD ARMBOARD 7.5X6 YLW CONV (MISCELLANEOUS) ×6 IMPLANT
PADDING CAST COTTON 6X4 STRL (CAST SUPPLIES) IMPLANT
SAW GIGLI STERILE 20 (MISCELLANEOUS) ×3 IMPLANT
SPONGE GAUZE 4X4 12PLY STER LF (GAUZE/BANDAGES/DRESSINGS) ×3 IMPLANT
SPONGE LAP 18X18 X RAY DECT (DISPOSABLE) ×3 IMPLANT
STOCKINETTE IMPERVIOUS LG (DRAPES) ×3 IMPLANT
STRIP CLOSURE SKIN 1/2X4 (GAUZE/BANDAGES/DRESSINGS) ×2 IMPLANT
SUT BONE WAX W31G (SUTURE) IMPLANT
SUT ETHILON 3 0 PS 1 (SUTURE) IMPLANT
SUT SILK 0 TIES 10X30 (SUTURE) ×3 IMPLANT
SUT SILK 2 0 (SUTURE)
SUT SILK 2 0 SH CR/8 (SUTURE) ×3 IMPLANT
SUT SILK 2-0 18XBRD TIE 12 (SUTURE) IMPLANT
SUT SILK 3 0 (SUTURE)
SUT SILK 3-0 18XBRD TIE 12 (SUTURE) IMPLANT
SUT VIC AB 2-0 CT1 18 (SUTURE) ×9 IMPLANT
SUT VIC AB 4-0 PS2 27 (SUTURE) ×6 IMPLANT
TAPE UMBILICAL COTTON 1/8X30 (MISCELLANEOUS) ×3 IMPLANT
TOWEL OR 17X24 6PK STRL BLUE (TOWEL DISPOSABLE) ×3 IMPLANT
TOWEL OR 17X26 10 PK STRL BLUE (TOWEL DISPOSABLE) ×3 IMPLANT
UNDERPAD 30X30 INCONTINENT (UNDERPADS AND DIAPERS) ×3 IMPLANT
WATER STERILE IRR 1000ML POUR (IV SOLUTION) ×3 IMPLANT

## 2015-08-16 NOTE — Transfer of Care (Signed)
Immediate Anesthesia Transfer of Care Note  Patient: Cory Weber  Procedure(s) Performed: Procedure(s): LEFT BELOW THE KNEE AMPUTATION  (Left)  Patient Location: PACU  Anesthesia Type:General  Level of Consciousness: awake, oriented and patient cooperative  Airway & Oxygen Therapy: Patient Spontanous Breathing and Patient connected to nasal cannula oxygen  Post-op Assessment: Report given to RN, Post -op Vital signs reviewed and stable and Patient moving all extremities  Post vital signs: Reviewed and stable  Last Vitals:  Filed Vitals:   08/16/15 0744 08/16/15 0750  BP: 128/82 125/90  Pulse: 91 85  Temp:  36.7 C  Resp:  18    Last Pain:  Filed Vitals:   08/16/15 0751  PainSc: Asleep      Patients Stated Pain Goal: 2 ([id number])  Complications: No apparent anesthesia complications

## 2015-08-16 NOTE — Op Note (Signed)
    Patient name: Cory Weber MRN: [id number] DOB: Jun 06, 1950 Sex: male  08/14/2015 - 08/16/2015 Pre-operative Diagnosis: left foot non-healing ulcer Post-operative diagnosis:  Same Surgeon:  Annamarie Major Assistants:  Gaye Alken Procedure:   Left below knee amputation Anesthesia:  general Blood Loss:  See anesthesia record Specimens:  Left leg  Findings:  Viable muscle  Indications:  The patient initially presented with a wound to his left foot.  At that time, he developed acute renal failure which delayed angiographic intervention.  Ultimately he was cleared to proceed and intervention was performed.  Unfortunately, the patient's wound has deteriorated.  He was found to have maggots in the wound.  The leg was deemed nonsalvageable and he comes in for below-knee amputation  Procedure:  The patient was identified in the holding area and taken to [facility]  The patient was then placed supine on the table. general anesthesia was administered.  The patient was prepped and draped in the usual sterile fashion.  A time out was called and antibiotics were administered.  A circumferential measurement of the cath was taken 13 cm below the tibial tuberosity.  A posterior flap was created using a 2/3 x 1/3 approach.  Cautery was used to divide subcutaneous tissue down to the fascia.  The muscle was then divided with cautery.  There was a fair amount of bleeding and therefore I inflated a tourniquet.  The tibia was circumferentially exposed as was the fibula.  A Gigli saw was used to transect the tibia beveling the anterior surface.  The fibula was then transected with large bone cutters proximal to the cut edge of the tibia.  The neurovascular bundles were then individually isolated.  A amputation knife was used to complete the amputation the leg was removed as a specimen.  The neurovascular bundles were then ligated proximal to the cut edge of the tibia.  A rasp was used to smooth the surfaces of the  bone.  Irrigation was performed.  Hemostasis was achieved.  The fascia was then closed with interrupted 3-0 Vicryl followed by 4-0 Vicryl and Steri-Strips on the skin.  Sterile dressings were applied.  There were no immediate complications.   Disposition:  To PACU in stable condition.   Theotis Burrow, M.D. [facility] Office: [phone] Pager:  [phone]

## 2015-08-16 NOTE — Progress Notes (Signed)
Inpatient Diabetes Program Recommendations  AACE/ADA: New Consensus Statement on Inpatient Glycemic Control (2015)  Target Ranges:  Prepandial:   less than 140 mg/dL      Peak postprandial:   less than 180 mg/dL (1-2 hours)      Critically ill patients:  140 - 180 mg/dL   Lab Results  Component Value Date   GLUCAP 163* 08/16/2015   HGBA1C 7.0* 07/24/2015    Review of Glycemic Control:  Results for GEOF, PIMENTA (MRN [id number]) as of 08/16/2015 11:52  Ref. Range 08/15/2015 20:33 08/15/2015 23:57 08/16/2015 04:31 08/16/2015 07:59 08/16/2015 10:18  Glucose-Capillary Latest Ref Range: 65-99 mg/dL 291 (H) 183 (H) 138 (H) 161 (H) 163 (H)    Diabetes history: Type 2 diabetes Outpatient Diabetes medications: Glucotrol 5 mg bid, Metformin 1000 mg bid  Current orders for Inpatient glycemic control:  Novolog sensitive q 4 hours  Inpatient Diabetes Program Recommendations:    While in the hospital, may consider adding basal insulin such as Lantus 12 units daily.  Thanks, Adah Perl, RN, BC-ADM Inpatient Diabetes Coordinator Pager [phone] (8a-5p)

## 2015-08-16 NOTE — Interval H&P Note (Signed)
History and Physical Interval Note:  08/16/2015 8:02 AM  Cory Weber  has presented today for surgery, with the diagnosis of gangrene  The various methods of treatment have been discussed with the patient and family. After consideration of risks, benefits and other options for treatment, the patient has consented to  Procedure(s): AMPUTATION BELOW KNEE (Left) as a surgical intervention .  The patient's history has been reviewed, patient examined, no change in status, stable for surgery.  I have reviewed the patient's chart and labs.  Questions were answered to the patient's satisfaction.     Abiola Behring, [place]  Non-salvageable left foot, now with Maggots.  Will proceed with BKA  wb

## 2015-08-16 NOTE — Progress Notes (Signed)
PROGRESS NOTE                                                                                                                                                                                                             Patient Demographics:    Cory Weber, is a 65 y.o. male, DOB - Jun 30, 1950, [id number]  Admit date - 08/14/2015   Admitting Physician Toy Baker, MD  Outpatient Primary MD for the patient is Maggie Font, MD  LOS - 1  Outpatient Specialists: Dr. Trula Slade   Chief Complaint  Patient presents with  . Wound Infection       Brief Narrative   65 yo M with history of HTN, PVD, DM2, CKD III, recently hospitalized for LLE cellulitis / wounds, d/c on 6/29 (of note, he refused SNF), admitted again with LLE wound / wet gangrene worsening and now with maggots, Status post left BKA by Dr. Adele Schilder on 08/16/2015.   Subjective:    [name] today has, No headache, No chest pain, No abdominal pain   Assessment  & Plan :    Active Problems:   Accelerated hypertension   Peripheral vascular disease, unspecified ([place])   Diabetes mellitus with foot ulcer, without long-term current use of insulin ([facility])   Chronic kidney disease, stage 3   Essential hypertension   Diabetic wet gangrene of the foot ([place])   Wet Gangrene /cellulitis  - he was recently evaluated by vascular surgery and Dr. Trula Slade did balloon angioplasty, left popliteal artery, balloon angioplasty, left tibioperoneal trunk and failed balloon angioplasty, left posterior tibial artery - continue IV antibiotics for next 1-2 days - Vascular Surgery appreciated, status post left BKA by Dr. Trula Slade on 7/5  Diabetes mellitus with foot ulcer, without long-term current use of insulin ([place]) - most recent A1C 7.0 on 07/24/15 - Tended to hold oral hypoglycemic agent, continue with insulin sliding scale, will start on low-dose Lantus during hospital stay  Essential hypertension    - continue home medications  Peripheral vascular disease  - vascular surgery consulted  Chronic kidney disease, stage 3  - currently creatinine better than baseline will avoid nephrotoxic medications  Anemia  - patient have recently required transfusions due to iron deficiency anemia will avoid NSAID use. Obtain type and screen transfuse for hemoglobin below 7   Code Status : Full  Family Communication  : None at bedside  Disposition Plan  : Pending PT evaluation  Consults  :  Orthopedic  Procedures  : Left BKA by Dr. Sela Hua 7/5  DVT Prophylaxis  :   Heparin  Lab Results  Component Value Date   PLT 357 08/15/2015    Antibiotics  :    Anti-infectives    Start     Dose/Rate Route Frequency Ordered Stop   08/15/15 1200  vancomycin (VANCOCIN) IVPB 1000 mg/200 mL premix     1,000 mg 200 mL/hr over 60 Minutes Intravenous Every 12 hours 08/15/15 0306     08/15/15 0800  piperacillin-tazobactam (ZOSYN) IVPB 3.375 g     3.375 g 12.5 mL/hr over 240 Minutes Intravenous Every 8 hours 08/15/15 0306     08/15/15 0030  vancomycin (VANCOCIN) IVPB 1000 mg/200 mL premix     1,000 mg 200 mL/hr over 60 Minutes Intravenous  Once 08/15/15 0025 08/15/15 0212   08/15/15 0030  piperacillin-tazobactam (ZOSYN) IVPB 3.375 g     3.375 g 100 mL/hr over 30 Minutes Intravenous  Once 08/15/15 0025 08/15/15 0243        Objective:   Filed Vitals:   08/16/15 1043 08/16/15 1058 08/16/15 1113 08/16/15 1133  BP: 130/85 126/83 146/88 153/88  Pulse: 92 90 88 87  Temp:    98.2 F (36.8 C)  TempSrc:    Oral  Resp: 23 12 13 14   Height:      Weight:      SpO2: 93% 96% 100% 100%    Wt Readings from Last 3 Encounters:  08/15/15 90.3 kg (199 lb 1.2 oz)  07/23/15 90.266 kg (199 lb)  07/18/15 90.674 kg (199 lb 14.4 oz)     Intake/Output Summary (Last 24 hours) at 08/16/15 1532 Last data filed at 08/16/15 1008  Gross per 24 hour  Intake   1040 ml  Output   1775 ml  Net   -735 ml      Physical Exam  Awake Alert, Oriented X 3, No new F.N deficits, Normal affect [place].AT,PERRAL Supple Neck,No JVD, No cervical lymphadenopathy appriciated.  Symmetrical Chest wall movement, Good air movement bilaterally, CTAB RRR,No Gallops,Rubs or new Murmurs, No Parasternal Heave +ve B.Sounds, Abd Soft, No tenderness, No organomegaly appriciated, No rebound - guarding or rigidity. No Cyanosis, Clubbing or edema,Left BKA, on tract with Ace wrap, no evidence of bleed    Data Review:    CBC  Recent Labs Lab 08/10/15 0228 08/14/15 2019 08/15/15 0615  WBC 8.8 11.3* 9.4  HGB 7.7* 8.0* 7.5*  HCT 24.8* 26.6* 25.0*  PLT 482* 387 357  MCV 87.3 89.3 88.3  MCH 27.1 26.8 26.5  MCHC 31.0 30.1 30.0  RDW 14.4 14.7 14.6  LYMPHSABS  --  2.1  --   MONOABS  --  0.9  --   EOSABS  --  0.2  --   BASOSABS  --  0.0  --     Chemistries   Recent Labs Lab 08/10/15 0228 08/14/15 2019 08/15/15 0615  NA 137 135 136  K 4.8 3.8 3.5  CL 100* 101 102  CO2 28 25 27   GLUCOSE 102* 247* 125*  BUN 14 12 11   CREATININE 1.34* 1.17 1.10  CALCIUM 9.4 9.2 9.1  MG  --   --  1.6*  AST  --  17 13*  ALT  --  19 17  ALKPHOS  --  71 51  BILITOT  --  0.2* 0.6   ------------------------------------------------------------------------------------------------------------------  No results for input(s): CHOL, HDL, LDLCALC, TRIG, CHOLHDL, LDLDIRECT in the last 72 hours.  Lab Results  Component Value Date   HGBA1C 7.0* 07/24/2015   ------------------------------------------------------------------------------------------------------------------  Recent Labs  08/15/15 0615  TSH 2.687   ------------------------------------------------------------------------------------------------------------------ No results for input(s): VITAMINB12, FOLATE, FERRITIN, TIBC, IRON, RETICCTPCT in the last 72 hours.  Coagulation profile No results for input(s): INR, PROTIME in the last 168 hours.  No results for  input(s): DDIMER in the last 72 hours.  Cardiac Enzymes No results for input(s): CKMB, TROPONINI, MYOGLOBIN in the last 168 hours.  Invalid input(s): CK ------------------------------------------------------------------------------------------------------------------ No results found for: BNP  Inpatient Medications  Scheduled Meds: . carvedilol  3.125 mg Oral BID WC  . famotidine  20 mg Oral Daily  . feeding supplement (ENSURE ENLIVE)  237 mL Oral BID BM  . gabapentin  300 mg Oral BID  . lisinopril  20 mg Oral BID   And  . hydrochlorothiazide  12.5 mg Oral BID  . insulin aspart  0-9 Units Subcutaneous TID WC  . insulin glargine  8 Units Subcutaneous Daily  . oxyCODONE-acetaminophen      . piperacillin-tazobactam (ZOSYN)  IV  3.375 g Intravenous Q8H  . simvastatin  20 mg Oral q1800  . tamsulosin  0.4 mg Oral Daily  . thiamine  100 mg Oral Daily  . vancomycin  1,000 mg Intravenous Q12H   Continuous Infusions:  PRN Meds:.acetaminophen **OR** acetaminophen, morphine injection, ondansetron **OR** ondansetron (ZOFRAN) IV, oxyCODONE-acetaminophen  Micro Results Recent Results (from the past 240 hour(s))  Surgical pcr screen     Status: None   Collection Time: 08/15/15  4:56 AM  Result Value Ref Range Status   MRSA, PCR NEGATIVE NEGATIVE Final   Staphylococcus aureus NEGATIVE NEGATIVE Final    Comment:        The Xpert SA Assay (FDA approved for NASAL specimens in patients over 35 years of age), is one component of a comprehensive surveillance program.  Test performance has been validated by [facility] for patients greater than or equal to 14 year old. It is not intended to diagnose infection nor to guide or monitor treatment.     Radiology Reports US Renal  07/27/2015  CLINICAL DATA:  Acute kidney injury EXAM: RENAL / URINARY TRACT ULTRASOUND COMPLETE COMPARISON:  None. FINDINGS: Right Kidney: Length: 12.6 cm. Mildly increased echogenicity. No hydronephrosis or mass.  Left Kidney: Length: 12.6 cm. Mildly increased echogenicity. No hydronephrosis or mass. Bladder: Not evaluated.  Decompressed by Foley catheter. IMPRESSION: Evidence of medical renal disease Electronically Signed   By: Skipper Cliche M.D.   On: 07/27/2015 11:21   US Arterial Seg Multiple  07/19/2015  CLINICAL DATA:  65 year old male with left foot cellulitis and pain. EXAM: NONINVASIVE PHYSIOLOGIC VASCULAR STUDY OF BILATERAL LOWER EXTREMITIES TECHNIQUE: Evaluation of both lower extremities was performed at rest, including calculation of ankle-brachial indices, multiple segmental pressure evaluation, segmental Doppler and segmental pulse volume recording. COMPARISON:  None. FINDINGS: Right ABI:  0.91 Left ABI:  0.45 Right Lower Extremity: Significant pressure differential between the low thigh and calf cuffs suggesting distal femoral popliteal disease. Preserved digital waveforms on PVRs. Left Lower Extremity: Significant pressure gradient between the low thigh cuff and calf cuff consistent with distal femoral popliteal disease. Additionally, there are significant pressure gradients between the calf and ankle consistent with runoff disease. IMPRESSION: 1. Resting right ankle brachial index of 0.91 consistent with at least mild peripheral arterial disease. Segmental pressures, arterial waveforms and PVRs suggest  femoral popliteal disease. 2. Resting left ankle brachial index of 0.45 consistent with advanced peripheral arterial disease. Segmental pressures, arterial waveforms and PVRs suggest both femoral popliteal disease and runoff disease. Given clinical history of left foot wound and rest pain these findings are concerning for critical limb ischemia (CLI). Recommend referral to vascular Interventional Radiology or other endovascular specialist for further evaluation and management. Signed, Criselda Peaches, MD [facility] Electronically Signed   By:  Jacqulynn Cadet M.D.   On: 07/19/2015 16:22   Dg Foot 2 Views Left  07/24/2015  CLINICAL DATA:  Cellulitis with wound on first toe EXAM: LEFT FOOT - 2 VIEW COMPARISON:  July 18, 2015 FINDINGS: Frontal and lateral views were obtained. There is no demonstrable fracture or dislocation. The joint spaces appear normal. No erosive change or bony destruction. There is soft tissue swelling over the dorsum of the foot. There is no soft tissue air or radiopaque foreign body. IMPRESSION: No bony destruction or erosion. No fracture or dislocation. Soft tissue swelling over the dorsal foot. No soft tissue abscess or radiopaque foreign body evident. Electronically Signed   By: Lowella Grip III M.D.   On: 07/24/2015 07:23   Dg Foot Complete Left  07/18/2015  CLINICAL DATA:  Left foot pain with ulcer along the great toe and fourth toe for 2 weeks. Diabetic. EXAM: LEFT FOOT - COMPLETE 3+ VIEW COMPARISON:  06/29/2015 prior exams FINDINGS: There is no evidence of acute fracture, subluxation or dislocation. No radiographic evidence of osteomyelitis identified. No radiopaque foreign bodies are present. Vascular calcifications are again noted. IMPRESSION: No acute bony abnormalities. Electronically Signed   By: Margarette Canada M.D.   On: 07/18/2015 14:21     Juna Caban M.D on 08/16/2015 at 3:32 PM  Between 7am to 7pm - Pager - [phone]  After 7pm go to [email or web address] - password [facility]  [facility] -  Office  [phone]

## 2015-08-16 NOTE — Anesthesia Preprocedure Evaluation (Addendum)
Anesthesia Evaluation  Patient identified by MRN, date of birth, ID band Patient awake    Reviewed: Allergy & Precautions, H&P , NPO status , Patient's Chart, lab work & pertinent test results, reviewed documented beta blocker date and time   Airway Mallampati: II  TM Distance: >3 FB Neck ROM: Full    Dental no notable dental hx. (+) Edentulous Upper, Edentulous Lower, Dental Advisory Given   Pulmonary asthma , former smoker,    Pulmonary exam normal breath sounds clear to auscultation       Cardiovascular hypertension, Pt. on medications and Pt. on home beta blockers + Peripheral Vascular Disease   Rhythm:Regular Rate:Normal     Neuro/Psych CVA negative psych ROS   GI/Hepatic PUD, (+) Hepatitis -, C  Endo/Other  diabetes, Type 2, Oral Hypoglycemic Agents  Renal/GU Renal InsufficiencyRenal diseasenegative Renal ROS  negative genitourinary   Musculoskeletal   Abdominal   Peds  Hematology negative hematology ROS (+) anemia ,   Anesthesia Other Findings   Reproductive/Obstetrics negative OB ROS                            Anesthesia Physical Anesthesia Plan  ASA: III  Anesthesia Plan: General   Post-op Pain Management:    Induction: Intravenous  Airway Management Planned: Oral ETT  Additional Equipment:   Intra-op Plan:   Post-operative Plan: Extubation in OR  Informed Consent: I have reviewed the patients History and Physical, chart, labs and discussed the procedure including the risks, benefits and alternatives for the proposed anesthesia with the patient or authorized representative who has indicated his/her understanding and acceptance.   Dental advisory given  Plan Discussed with: CRNA  Anesthesia Plan Comments:         Anesthesia Quick Evaluation

## 2015-08-16 NOTE — Anesthesia Postprocedure Evaluation (Signed)
Anesthesia Post Note  Patient: Cory Weber  Procedure(s) Performed: Procedure(s) (LRB): LEFT BELOW THE KNEE AMPUTATION  (Left)  Patient location during evaluation: PACU Anesthesia Type: General Level of consciousness: awake and alert Pain management: pain level controlled Vital Signs Assessment: post-procedure vital signs reviewed and stable Respiratory status: spontaneous breathing, nonlabored ventilation, respiratory function stable and patient connected to nasal cannula oxygen Cardiovascular status: blood pressure returned to baseline and stable Postop Assessment: no signs of nausea or vomiting Anesthetic complications: no    Last Vitals:  Filed Vitals:   08/16/15 1113 08/16/15 1133  BP: 146/88 153/88  Pulse: 88 87  Temp:  36.8 C  Resp: 13 14    Last Pain:  Filed Vitals:   08/16/15 1136  PainSc: 0-No pain                 Yelena Metzer,W. EDMOND

## 2015-08-16 NOTE — Anesthesia Procedure Notes (Signed)
Procedure Name: Intubation Date/Time: 08/16/2015 8:37 AM Performed by: Melina Copa, Syd Newsome R Pre-anesthesia Checklist: Patient identified, Emergency Drugs available, Suction available and Patient being monitored Patient Re-evaluated:Patient Re-evaluated prior to inductionOxygen Delivery Method: Circle System Utilized Preoxygenation: Pre-oxygenation with 100% oxygen Intubation Type: IV induction Ventilation: Mask ventilation without difficulty Laryngoscope Size: Mac and 4 Grade View: Grade I Tube type: Oral Tube size: 7.5 mm Number of attempts: 1 Airway Equipment and Method: Stylet and Oral airway Placement Confirmation: ETT inserted through vocal cords under direct vision,  positive ETCO2 and breath sounds checked- equal and bilateral Secured at: 22 cm Tube secured with: Tape Dental Injury: Teeth and Oropharynx as per pre-operative assessment

## 2015-08-16 NOTE — H&P (View-Only) (Signed)
Referring Physician: Dr Roel Cluck Patient name: Cory Weber MRN: [id number] DOB: July 16, 1950 Sex: male  REASON FOR CONSULT: gangrene left foot  HPI: Cory Weber is a 65 y.o. male, s/p left popliteal angioplasty by Dr Trula Slade 1 week ago for left foot wound.  Pt readmitted yesterday due to worsening of wound and maggots.  Pt states foot does hurt but feels better than before procedure. He denies fever/chills/nausea/vomiting.  Other medical problems include DM, hypertension, hep C, neuropathy.  All currently stable  Past Medical History  Diagnosis Date  . Diabetes mellitus   . Hypertension   . Hepatitis C     states he was diagnosed in 2007 or 2007 while living in [place], [place]  . CVA (cerebral infarction) 01/2011  . Hyperlipidemia   . Asthma   . PVD (peripheral vascular disease) ([place])   . Anemia   . Stroke ([place])   . Headache(784.0)   . Peripheral neuropathy ([place])   . Cellulitis and abscess of foot 07/2015   Past Surgical History  Procedure Laterality Date  . Liver biopsy  2005    Done in [place], [place]. Chronic hepatitis with mild periportal inflammation, lobular unicellular necrosis and portal fibrosis. Grade 2, stage 1-2.  Marland Kitchen Colonoscopy with propofol N/A 09/03/2012    [id number] polyp-removed as outlined above. Prominent internal hemorrhoids. Tubular adenoma  . Esophagogastroduodenoscopy (egd) with propofol N/A 09/03/2012    [id number] hernia. Gastric diverticulum. Gastric ulcers with associated erosions. Duodenal erosions. Status post gastric biopsy. H.PYLORI gastritis   . Biopsy N/A 09/03/2012    Procedure: BIOPSY;  Surgeon: Daneil Dolin, MD;  Location: [facility];  Service: Endoscopy;  Laterality: N/A;  gastric and gastric mucosa  . Polypectomy N/A 09/03/2012    Procedure: POLYPECTOMY;  Surgeon: Daneil Dolin, MD;  Location: [facility];  Service: Endoscopy;  Laterality: N/A;  cecal polyp  . Esophagogastroduodenoscopy (egd) with propofol N/A 12/03/2012    Dr. Gala Romney: gastric  diverticulum, gastric erosions and scar. Previously noted gastric ulcer completed healed. Biopsy without H.pylori.   . Biopsy N/A 12/03/2012    Procedure: BIOPSY;  Surgeon: Daneil Dolin, MD;  Location: [facility];  Service: Endoscopy;  Laterality: N/A;  . Maximum access (mas)posterior lumbar interbody fusion (plif) 1 level N/A 11/25/2013    Procedure: FOR MAXIMUM ACCESS (MAS) POSTERIOR LUMBAR INTERBODY FUSION (PLIF) 1 LEVEL;  Surgeon: Eustace Moore, MD;  Location: [facility];  Service: Neurosurgery;  Laterality: N/A;  FOR MAXIMUM ACCESS (MAS) POSTERIOR LUMBAR INTERBODY FUSION (PLIF) 1 LEVEL LUMBAR 3-4  . Peripheral vascular catheterization Left 08/01/2015    Procedure: Lower Extremity Angiography;  Surgeon: Serafina Mitchell, MD;  Location: [facility];  Service: Cardiovascular;  Laterality: Left;  . Peripheral vascular catheterization N/A 08/01/2015    Procedure: Abdominal Aortogram;  Surgeon: Serafina Mitchell, MD;  Location: [facility];  Service: Cardiovascular;  Laterality: N/A;  . Peripheral vascular catheterization N/A 08/08/2015    Procedure: Abdominal Aortogram w/Lower Extremity;  Surgeon: Serafina Mitchell, MD;  Location: [facility];  Service: Cardiovascular;  Laterality: N/A;  . Peripheral vascular catheterization Left 08/08/2015    Procedure: Peripheral Vascular Balloon Angioplasty;  Surgeon: Serafina Mitchell, MD;  Location: [facility];  Service: Cardiovascular;  Laterality: Left;  left popiteal artery, left peronealtrunk, left post tibial    Family History  Problem Relation Age of Onset  . Breast cancer Mother     deceased  . Cancer Mother   .  Diabetes Father   . Hypertension Father   . Heart disease Father     deceased  . Hyperlipidemia Father   . Diabetes Sister   . Hypertension Sister   . Breast cancer Sister   . Colon cancer Neg Hx   . Liver disease Neg Hx   . Heart failure Mother     SOCIAL HISTORY: Social History   Social History  . Marital  Status: Single    Spouse Name: N/A  . Number of Children: 1  . Years of Education: N/A   Occupational History  . trying to get disability    Social History Main Topics  . Smoking status: Current Some Day Smoker -- 0.50 packs/day for 20 years    Types: Cigarettes  . Smokeless tobacco: Never Used     Comment: given 1-800-Quit Now  . Alcohol Use: No  . Drug Use: No     Comment: last used April 2016.  Marland Kitchen Sexual Activity: Not on file   Other Topics Concern  . Not on file   Social History Narrative    No Known Allergies  Current Facility-Administered Medications  Medication Dose Route Frequency Provider Last Rate Last Dose  . 0.9 %  sodium chloride infusion   Intravenous Continuous Toy Baker, MD 100 mL/hr at 08/15/15 0559    . acetaminophen (TYLENOL) tablet 650 mg  650 mg Oral Q6H PRN Toy Baker, MD       Or  . acetaminophen (TYLENOL) suppository 650 mg  650 mg Rectal Q6H PRN Toy Baker, MD      . carvedilol (COREG) tablet 3.125 mg  3.125 mg Oral BID WC Toy Baker, MD   3.125 mg at 08/15/15 0818  . famotidine (PEPCID) tablet 20 mg  20 mg Oral Daily Toy Baker, MD   20 mg at 08/15/15 0819  . gabapentin (NEURONTIN) capsule 300 mg  300 mg Oral BID Toy Baker, MD   300 mg at 08/15/15 0352  . lisinopril (PRINIVIL,ZESTRIL) tablet 20 mg  20 mg Oral BID Toy Baker, MD   20 mg at 08/15/15 0352   And  . hydrochlorothiazide (MICROZIDE) capsule 12.5 mg  12.5 mg Oral BID Toy Baker, MD   12.5 mg at 08/15/15 0353  . insulin aspart (novoLOG) injection 0-9 Units  0-9 Units Subcutaneous Q4H Toy Baker, MD   2 Units at 08/15/15 0430  . morphine 2 MG/ML injection 1 mg  1 mg Intravenous Q3H PRN Toy Baker, MD   1 mg at 08/15/15 0816  . ondansetron (ZOFRAN) tablet 4 mg  4 mg Oral Q6H PRN Toy Baker, MD       Or  . ondansetron (ZOFRAN) injection 4 mg  4 mg Intravenous Q6H PRN Toy Baker, MD      .  oxyCODONE-acetaminophen (PERCOCET/ROXICET) 5-325 MG per tablet 2 tablet  2 tablet Oral Q6H PRN Toy Baker, MD   2 tablet at 08/15/15 0333  . piperacillin-tazobactam (ZOSYN) IVPB 3.375 g  3.375 g Intravenous Q8H Veronda P Bryk, RPH      . simvastatin (ZOCOR) tablet 20 mg  20 mg Oral q1800 Toy Baker, MD      . tamsulosin (FLOMAX) capsule 0.4 mg  0.4 mg Oral Daily Toy Baker, MD   0.4 mg at 08/15/15 0818  . thiamine (VITAMIN B-1) tablet 100 mg  100 mg Oral Daily Toy Baker, MD   100 mg at 08/15/15 0818  . vancomycin (VANCOCIN) IVPB 1000 mg/200 mL premix  1,000 mg Intravenous Q12H Veronda  P Bryk, RPH        ROS:   General:  No weight loss, Fever, chills  HEENT: No recent headaches, no nasal bleeding, no visual changes, no sore throat  Neurologic: No dizziness, blackouts, seizures. No recent symptoms of stroke or mini- stroke. No recent episodes of slurred speech, or temporary blindness.  Cardiac: No recent episodes of chest pain/pressure, no shortness of breath at rest.  No shortness of breath with exertion.  Denies history of atrial fibrillation or irregular heartbeat  Vascular: No history of rest pain in feet.  No history of claudication.  + history of non-healing ulcer, No history of DVT   Pulmonary: No home oxygen, no productive cough, no hemoptysis,  No asthma or wheezing  Musculoskeletal:  [ ]  Arthritis, [ ]  Low back pain,  [ ]  Joint pain  Hematologic:No history of hypercoagulable state.  No history of easy bleeding.  No history of anemia  Gastrointestinal: No hematochezia or melena,  No gastroesophageal reflux, no trouble swallowing  Urinary: [x ] chronic Kidney disease, [ ]  on HD - [ ]  MWF or [ ]  TTHS, [ ]  Burning with urination, [ ]  Frequent urination, [ ]  Difficulty urinating;   Skin: No rashes  Psychological: No history of anxiety,  No history of depression   Physical Examination  Filed Vitals:   08/15/15 0230 08/15/15 0253 08/15/15 0300  08/15/15 0815  BP: 143/87 154/94  120/85  Pulse: 84 97  88  Temp:  98 F (36.7 C)  98.2 F (36.8 C)  TempSrc:  Oral  Oral  Resp:    17  Height:   6\' 1"  (1.854 m)   Weight:   199 lb 1.2 oz (90.3 kg)   SpO2: 99% 100%  100%    Body mass index is 26.27 kg/(m^2).  General:  Alert and oriented, no acute distress HEENT: Normal Neck: No bruit or JVD Pulmonary: Clear to auscultation bilaterally Cardiac: Regular Rate and Rhythm without murmur Abdomen: Soft, non-tender, non-distended, no mass Skin: No rash wet gangrene entire forefoot with slough of skin and scattered maggots between toes Extremity Pulses:  2+ radial, brachial, femoral, absent dorsalis pedis, posterior tibial pulses bilaterally Musculoskeletal: left foot edema  Neurologic: Upper and lower extremity motor 5/5 and symmetric  DATA:  CBC    Component Value Date/Time   WBC 9.4 08/15/2015 0615   RBC 2.83* 08/15/2015 0615   RBC 4.06* 11/24/2013 0335   HGB 7.5* 08/15/2015 0615   HCT 25.0* 08/15/2015 0615   HCT 32 10/31/2011 1319   PLT 357 08/15/2015 0615   MCV 88.3 08/15/2015 0615   MCV 82.8 10/31/2011 1319   MCH 26.5 08/15/2015 0615   MCHC 30.0 08/15/2015 0615   RDW 14.6 08/15/2015 0615   LYMPHSABS 2.1 08/14/2015 2019   MONOABS 0.9 08/14/2015 2019   EOSABS 0.2 08/14/2015 2019   BASOSABS 0.0 08/14/2015 2019    BMET    Component Value Date/Time   NA 136 08/15/2015 0615   NA 140 10/31/2011 1319   K 3.5 08/15/2015 0615   K 4.0 10/31/2011 1319   CL 102 08/15/2015 0615   CO2 27 08/15/2015 0615   GLUCOSE 125* 08/15/2015 0615   BUN 11 08/15/2015 0615   BUN 10 10/31/2011 1319   CREATININE 1.10 08/15/2015 0615   CREATININE 0.99 10/31/2011 1319   CALCIUM 9.1 08/15/2015 0615   CALCIUM 9.0 10/31/2011 1319   GFRNONAA >60 08/15/2015 0615   GFRAA >60 08/15/2015 0615    ASSESSMENT:  Non  salvageable left foot   PLAN:  Left BKA tomorrow by Dr Trula Slade NPO p midnight Consent Ok to eat today   Ruta Hinds,  MD [facility] Office: [phone] Pager: [phone]

## 2015-08-17 ENCOUNTER — Ambulatory Visit: Payer: Medicaid Other | Admitting: Internal Medicine

## 2015-08-17 ENCOUNTER — Encounter (HOSPITAL_COMMUNITY): Payer: Self-pay | Admitting: Surgery

## 2015-08-17 LAB — BASIC METABOLIC PANEL
ANION GAP: 8 (ref 5–15)
BUN: 11 mg/dL (ref 6–20)
CALCIUM: 9.1 mg/dL (ref 8.9–10.3)
CO2: 28 mmol/L (ref 22–32)
Chloride: 96 mmol/L — ABNORMAL LOW (ref 101–111)
Creatinine, Ser: 1.18 mg/dL (ref 0.61–1.24)
Glucose, Bld: 251 mg/dL — ABNORMAL HIGH (ref 65–99)
Potassium: 4.6 mmol/L (ref 3.5–5.1)
SODIUM: 132 mmol/L — AB (ref 135–145)

## 2015-08-17 LAB — CBC
HCT: 23.4 % — ABNORMAL LOW (ref 39.0–52.0)
Hemoglobin: 7.3 g/dL — ABNORMAL LOW (ref 13.0–17.0)
MCH: 27.3 pg (ref 26.0–34.0)
MCHC: 31.2 g/dL (ref 30.0–36.0)
MCV: 87.6 fL (ref 78.0–100.0)
PLATELETS: 346 10*3/uL (ref 150–400)
RBC: 2.67 MIL/uL — AB (ref 4.22–5.81)
RDW: 14.4 % (ref 11.5–15.5)
WBC: 10.1 10*3/uL (ref 4.0–10.5)

## 2015-08-17 LAB — GLUCOSE, CAPILLARY
GLUCOSE-CAPILLARY: 235 mg/dL — AB (ref 65–99)
GLUCOSE-CAPILLARY: 147 mg/dL — AB (ref 65–99)
Glucose-Capillary: 207 mg/dL — ABNORMAL HIGH (ref 65–99)
Glucose-Capillary: 197 mg/dL — ABNORMAL HIGH (ref 65–99)
Glucose-Capillary: 283 mg/dL — ABNORMAL HIGH (ref 65–99)

## 2015-08-17 LAB — PREPARE RBC (CROSSMATCH)

## 2015-08-17 MED ORDER — INSULIN GLARGINE 100 UNIT/ML ~~LOC~~ SOLN
4.0000 [IU] | Freq: Once | SUBCUTANEOUS | Status: AC
  Administered 2015-08-17: 4 [IU] via SUBCUTANEOUS
  Filled 2015-08-17: qty 0.04

## 2015-08-17 MED ORDER — SIMVASTATIN 20 MG PO TABS
20.0000 mg | ORAL_TABLET | Freq: Every day | ORAL | Status: DC
  Administered 2015-08-17 – 2015-08-20 (×3): 20 mg via ORAL
  Filled 2015-08-17 (×3): qty 1

## 2015-08-17 MED ORDER — ASPIRIN EC 81 MG PO TBEC
81.0000 mg | DELAYED_RELEASE_TABLET | Freq: Every day | ORAL | Status: DC
  Administered 2015-08-17 – 2015-08-21 (×5): 81 mg via ORAL
  Filled 2015-08-17 (×5): qty 1

## 2015-08-17 MED ORDER — INSULIN GLARGINE 100 UNIT/ML ~~LOC~~ SOLN
12.0000 [IU] | Freq: Every day | SUBCUTANEOUS | Status: DC
  Administered 2015-08-18 – 2015-08-21 (×4): 12 [IU] via SUBCUTANEOUS
  Filled 2015-08-17 (×4): qty 0.12

## 2015-08-17 MED ORDER — PRO-STAT SUGAR FREE PO LIQD
30.0000 mL | Freq: Every day | ORAL | Status: DC
  Administered 2015-08-17 – 2015-08-20 (×4): 30 mL via ORAL
  Filled 2015-08-17 (×4): qty 30

## 2015-08-17 MED ORDER — GLUCERNA SHAKE PO LIQD
237.0000 mL | Freq: Three times a day (TID) | ORAL | Status: DC
  Administered 2015-08-17 – 2015-08-21 (×11): 237 mL via ORAL

## 2015-08-17 MED ORDER — SODIUM CHLORIDE 0.9 % IV SOLN
Freq: Once | INTRAVENOUS | Status: AC
  Administered 2015-08-17: 13:00:00 via INTRAVENOUS

## 2015-08-17 MED ORDER — RISAQUAD PO CAPS
2.0000 | ORAL_CAPSULE | Freq: Every day | ORAL | Status: DC
  Administered 2015-08-17 – 2015-08-21 (×5): 2 via ORAL
  Filled 2015-08-17 (×5): qty 2

## 2015-08-17 NOTE — Progress Notes (Addendum)
[facility]  Subjective  - States he feels better.   Objective 141/80 105 99.4 F (37.4 C) (Oral) 17 100%  Intake/Output Summary (Last 24 hours) at 08/17/15 1001 Last data filed at 08/17/15 0010  Gross per 24 hour  Intake    800 ml  Output    700 ml  Net    100 ml   Left BKA dressing is clean and dry    Assessment/Planning: POD # 1 left BKA  Will change dressing tomorrow   Laurence Slate [facility] 08/17/2015 10:01 AM --  Laboratory Lab Results:  Recent Labs  08/16/15 1614 08/17/15 0350  WBC 11.0* 10.1  HGB 8.0* 7.3*  HCT 26.0* 23.4*  PLT 365 346   BMET  Recent Labs  08/15/15 0615 08/17/15 0350  NA 136 132*  K 3.5 4.6  CL 102 96*  CO2 27 28  GLUCOSE 125* 251*  BUN 11 11  CREATININE 1.10 1.18  CALCIUM 9.1 9.1    COAG Lab Results  Component Value Date   INR 1.35 08/08/2015   INR 1.33 07/25/2015   INR 0.97 02/17/2015   No results found for: PTT    In great spirits today Dressing dry Will d/c dressing tomorrow PT,OT, [employer], and Rehab consults placed today If incision looks OK tomorrow, could got CIR tomorrow if accepted     [employer]

## 2015-08-17 NOTE — Progress Notes (Signed)
Initial Nutrition Assessment  DOCUMENTATION CODES:   Not applicable  INTERVENTION:  Discontinue Ensure.   Provide Glucerna Shake po TID, each supplement provides 220 kcal and 10 grams of protein.  Provide 30 ml Prostat po once daily, each supplement provides 100 kcal and 15 grams of protein.   Encourage adequate PO intake.   NUTRITION DIAGNOSIS:   Increased nutrient needs related to wound healing as evidenced by estimated needs.  GOAL:   Patient will meet greater than or equal to 90% of their needs  MONITOR:   PO intake, Supplement acceptance, Weight trends, Labs, I & O's  REASON FOR ASSESSMENT:   Malnutrition Screening Tool    ASSESSMENT:   65 yo M with history of HTN, PVD, DM2, CKD III, recently hospitalized for LLE cellulitis / wounds, d/c on 6/29 (of note, he refused SNF), admitted again with LLE wound / wet gangrene worsening and now with maggots, Status post left BKA by Dr. Adele Schilder on 08/16/2015.  Procedure (7/5): LEFT BELOW THE KNEE AMPUTATION (Left)  Pt reports having a good appetite currently and PTA with usual consumption of at least 3 meals a day. Meal completion has been 15-75%. Per Epic weight records, pt with gradual weight loss, however not found significant. Pt currently has Ensure ordered and has been consuming them. Noted CBGs have been elevated. RD to modify nutritional supplementation.   Nutrition-Focused physical exam completed. Findings are no fat depletion, moderate muscle depletion, and mild edema.   Labs and medications reviewed. CBGs 164-283 mg/dL.  Diet Order:  Diet Carb Modified Fluid consistency:: Thin; Room service appropriate?: Yes  Skin:   (Incision on L leg)  Last BM:  7/3  Height:   Ht Readings from Last 1 Encounters:  08/15/15 6\' 1"  (1.854 m)    Weight:   Wt Readings from Last 1 Encounters:  08/15/15 199 lb 1.2 oz (90.3 kg)    Ideal Body Weight:  78.2 kg (adjusted for BKA)  BMI:  Body mass index is 26.27  kg/(m^2).  Estimated Nutritional Needs:   Kcal:  2100-2400  Protein:  110-120 grams  Fluid:  2.1 - 2.4 L/day  EDUCATION NEEDS:   No education needs identified at this time  Corrin Parker, MS, RD, LDN Pager # [phone] After hours/ weekend pager # [phone]

## 2015-08-17 NOTE — Progress Notes (Signed)
PROGRESS NOTE                                                                                                                                                                                                             Patient Demographics:    Silis Gaetz, is a 65 y.o. male, DOB - 03-18-1950, [id number]  Admit date - 08/14/2015   Admitting Physician Toy Baker, MD  Outpatient Primary MD for the patient is Maggie Font, MD  LOS - 2  Outpatient Specialists: Dr. Trula Slade   Chief Complaint  Patient presents with  . Wound Infection       Brief Narrative   65 yo M with history of HTN, PVD, DM2, CKD III, recently hospitalized for LLE cellulitis / wounds, d/c on 6/29 (of note, he refused SNF), admitted again with LLE wound / wet gangrene worsening and now with maggots, Status post left BKA by Dr. Lytle Butte on 08/16/2015.   Subjective:    [name] today has, No headache, No chest pain, No abdominal pain, fever 102.7 overnight   Assessment  & Plan :    Active Problems:   Accelerated hypertension   Peripheral vascular disease, unspecified ([place])   Diabetes mellitus with foot ulcer, without long-term current use of insulin (HCC)   Chronic kidney disease, stage 3   Essential hypertension   Diabetic wet gangrene of the foot ([place])   Wet Gangrene /cellulitis  - he was recently evaluated by vascular surgery and Dr. Trula Slade did balloon angioplasty, left popliteal artery, balloon angioplasty, left tibioperoneal trunk and failed balloon angioplasty, left posterior tibial artery - continue IV antibiotics for next 1-2 days Giving significant fever overnight. - Vascular Surgery appreciated, status post left BKA by Dr. Trula Slade on 7/5  Diabetes mellitus with foot ulcer, without long-term current use of insulin ([place]) - most recent A1C 7.0 on 07/24/15 - Continue to hold oral hypoglycemic agent, continue with insulin sliding scale, will increase Lantus  from 8-12 units  Essential hypertension  - continue home medications  Peripheral vascular disease  - vascular surgery consulted  Chronic kidney disease, stage 3  - currently creatinine better than baseline will avoid nephrotoxic medications  Anemia  - patient have recently required transfusions due to iron deficiency anemia will avoid NSAID use. - Hemoglobin is 7.3 today, he is tachycardic, will transfuse 1  unit PRBC today.  Fever - Shin with fever 102.7 overnight, blood cultures were sent, encouraged to use incentive spirometry, tachycardic, will start on telemetry  Hyperlipidemia - Resume on Zocor  History of CVA - We'll resume on aspirin   Code Status : Full  Family Communication  : Wife at bedside  Disposition Plan  : Pending PT evaluation  Consults  :  Orthopedic  Procedures  : Left BKA by Dr. Trula Slade 7/5  DVT Prophylaxis  :   Heparin  Lab Results  Component Value Date   PLT 346 08/17/2015    Antibiotics  :    Anti-infectives    Start     Dose/Rate Route Frequency Ordered Stop   08/15/15 1200  vancomycin (VANCOCIN) IVPB 1000 mg/200 mL premix     1,000 mg 200 mL/hr over 60 Minutes Intravenous Every 12 hours 08/15/15 0306     08/15/15 0800  piperacillin-tazobactam (ZOSYN) IVPB 3.375 g     3.375 g 12.5 mL/hr over 240 Minutes Intravenous Every 8 hours 08/15/15 0306     08/15/15 0030  vancomycin (VANCOCIN) IVPB 1000 mg/200 mL premix     1,000 mg 200 mL/hr over 60 Minutes Intravenous  Once 08/15/15 0025 08/15/15 0212   08/15/15 0030  piperacillin-tazobactam (ZOSYN) IVPB 3.375 g     3.375 g 100 mL/hr over 30 Minutes Intravenous  Once 08/15/15 0025 08/15/15 0243        Objective:   Filed Vitals:   08/16/15 2100 08/16/15 2347 08/17/15 0010 08/17/15 0444  BP: 163/85 129/74 142/76 141/80  Pulse: 124 108 111 105  Temp: 102.7 F (39.3 C) 99.5 F (37.5 C) 98.9 F (37.2 C) 99.4 F (37.4 C)  TempSrc: Oral Oral Oral Oral  Resp: 18 16 17 17   Height:        Weight:      SpO2: 99% 95% 100% 100%    Wt Readings from Last 3 Encounters:  08/15/15 90.3 kg (199 lb 1.2 oz)  07/23/15 90.266 kg (199 lb)  07/18/15 90.674 kg (199 lb 14.4 oz)     Intake/Output Summary (Last 24 hours) at 08/17/15 1052 Last data filed at 08/17/15 0010  Gross per 24 hour  Intake      0 ml  Output    700 ml  Net   -700 ml     Physical Exam  Awake Alert, Oriented X 3, No new F.N deficits, Normal affect [place].AT,PERRAL Supple Neck,No JVD, No cervical lymphadenopathy appriciated.  Symmetrical Chest wall movement, Good air movement bilaterally, CTAB RRR,No Gallops,Rubs or new Murmurs, No Parasternal Heave +ve B.Sounds, Abd Soft, No tenderness, No organomegaly appriciated, No rebound - guarding or rigidity. No Cyanosis, Clubbing or edema,Left BKA,  with Ace wrap, no evidence of bleed    Data Review:    CBC  Recent Labs Lab 08/14/15 2019 08/15/15 0615 08/16/15 1614 08/17/15 0350  WBC 11.3* 9.4 11.0* 10.1  HGB 8.0* 7.5* 8.0* 7.3*  HCT 26.6* 25.0* 26.0* 23.4*  PLT 387 357 365 346  MCV 89.3 88.3 88.4 87.6  MCH 26.8 26.5 27.2 27.3  MCHC 30.1 30.0 30.8 31.2  RDW 14.7 14.6 14.4 14.4  LYMPHSABS 2.1  --   --   --   MONOABS 0.9  --   --   --   EOSABS 0.2  --   --   --   BASOSABS 0.0  --   --   --     Chemistries   Recent Labs Lab 08/14/15 2019  08/15/15 0615 08/17/15 0350  NA 135 136 132*  K 3.8 3.5 4.6  CL 101 102 96*  CO2 25 27 28   GLUCOSE 247* 125* 251*  BUN 12 11 11   CREATININE 1.17 1.10 1.18  CALCIUM 9.2 9.1 9.1  MG  --  1.6*  --   AST 17 13*  --   ALT 19 17  --   ALKPHOS 71 51  --   BILITOT 0.2* 0.6  --    ------------------------------------------------------------------------------------------------------------------ No results for input(s): CHOL, HDL, LDLCALC, TRIG, CHOLHDL, LDLDIRECT in the last 72 hours.  Lab Results  Component Value Date   HGBA1C 7.0* 07/24/2015    ------------------------------------------------------------------------------------------------------------------  Recent Labs  08/15/15 0615  TSH 2.687   ------------------------------------------------------------------------------------------------------------------ No results for input(s): VITAMINB12, FOLATE, FERRITIN, TIBC, IRON, RETICCTPCT in the last 72 hours.  Coagulation profile No results for input(s): INR, PROTIME in the last 168 hours.  No results for input(s): DDIMER in the last 72 hours.  Cardiac Enzymes No results for input(s): CKMB, TROPONINI, MYOGLOBIN in the last 168 hours.  Invalid input(s): CK ------------------------------------------------------------------------------------------------------------------ No results found for: BNP  Inpatient Medications  Scheduled Meds: . sodium chloride   Intravenous Once  . carvedilol  3.125 mg Oral BID WC  . famotidine  20 mg Oral Daily  . feeding supplement (ENSURE ENLIVE)  237 mL Oral BID BM  . gabapentin  300 mg Oral BID  . heparin subcutaneous  5,000 Units Subcutaneous Q8H  . lisinopril  20 mg Oral BID   And  . hydrochlorothiazide  12.5 mg Oral BID  . insulin aspart  0-9 Units Subcutaneous TID WC  . insulin glargine  8 Units Subcutaneous Daily  . piperacillin-tazobactam (ZOSYN)  IV  3.375 g Intravenous Q8H  . simvastatin  20 mg Oral q1800  . tamsulosin  0.4 mg Oral Daily  . thiamine  100 mg Oral Daily  . vancomycin  1,000 mg Intravenous Q12H   Continuous Infusions: . sodium chloride 100 mL/hr at 08/16/15 2352   PRN Meds:.acetaminophen **OR** acetaminophen, morphine injection, ondansetron **OR** ondansetron (ZOFRAN) IV, oxyCODONE-acetaminophen  Micro Results Recent Results (from the past 240 hour(s))  Blood culture (routine x 2)     Status: None (Preliminary result)   Collection Time: 08/15/15  1:02 AM  Result Value Ref Range Status   Specimen Description BLOOD RIGHT ARM  Final   Special Requests  BOTTLES DRAWN AEROBIC AND ANAEROBIC 5ML  Final   Culture NO GROWTH 1 DAY  Final   Report Status PENDING  Incomplete  Blood culture (routine x 2)     Status: None (Preliminary result)   Collection Time: 08/15/15  1:07 AM  Result Value Ref Range Status   Specimen Description BLOOD RIGHT HAND  Final   Special Requests BOTTLES DRAWN AEROBIC AND ANAEROBIC 5ML  Final   Culture NO GROWTH 1 DAY  Final   Report Status PENDING  Incomplete  Surgical pcr screen     Status: None   Collection Time: 08/15/15  4:56 AM  Result Value Ref Range Status   MRSA, PCR NEGATIVE NEGATIVE Final   Staphylococcus aureus NEGATIVE NEGATIVE Final    Comment:        The Xpert SA Assay (FDA approved for NASAL specimens in patients over 74 years of age), is one component of a comprehensive surveillance program.  Test performance has been validated by [facility] for patients greater than or equal to 50 year old. It is not intended to diagnose infection nor to  guide or monitor treatment.     Radiology Reports US Renal  07/27/2015  CLINICAL DATA:  Acute kidney injury EXAM: RENAL / URINARY TRACT ULTRASOUND COMPLETE COMPARISON:  None. FINDINGS: Right Kidney: Length: 12.6 cm. Mildly increased echogenicity. No hydronephrosis or mass. Left Kidney: Length: 12.6 cm. Mildly increased echogenicity. No hydronephrosis or mass. Bladder: Not evaluated.  Decompressed by Foley catheter. IMPRESSION: Evidence of medical renal disease Electronically Signed   By: Skipper Cliche M.D.   On: 07/27/2015 11:21   US Arterial Seg Multiple  07/19/2015  CLINICAL DATA:  65 year old male with left foot cellulitis and pain. EXAM: NONINVASIVE PHYSIOLOGIC VASCULAR STUDY OF BILATERAL LOWER EXTREMITIES TECHNIQUE: Evaluation of both lower extremities was performed at rest, including calculation of ankle-brachial indices, multiple segmental pressure evaluation, segmental Doppler and segmental pulse volume recording. COMPARISON:  None. FINDINGS: Right  ABI:  0.91 Left ABI:  0.45 Right Lower Extremity: Significant pressure differential between the low thigh and calf cuffs suggesting distal femoral popliteal disease. Preserved digital waveforms on PVRs. Left Lower Extremity: Significant pressure gradient between the low thigh cuff and calf cuff consistent with distal femoral popliteal disease. Additionally, there are significant pressure gradients between the calf and ankle consistent with runoff disease. IMPRESSION: 1. Resting right ankle brachial index of 0.91 consistent with at least mild peripheral arterial disease. Segmental pressures, arterial waveforms and PVRs suggest femoral popliteal disease. 2. Resting left ankle brachial index of 0.45 consistent with advanced peripheral arterial disease. Segmental pressures, arterial waveforms and PVRs suggest both femoral popliteal disease and runoff disease. Given clinical history of left foot wound and rest pain these findings are concerning for critical limb ischemia (CLI). Recommend referral to vascular Interventional Radiology or other endovascular specialist for further evaluation and management. Signed, Criselda Peaches, MD [facility] Radiology Electronically Signed   By: Jacqulynn Cadet M.D.   On: 07/19/2015 16:22   Dg Foot 2 Views Left  07/24/2015  CLINICAL DATA:  Cellulitis with wound on first toe EXAM: LEFT FOOT - 2 VIEW COMPARISON:  July 18, 2015 FINDINGS: Frontal and lateral views were obtained. There is no demonstrable fracture or dislocation. The joint spaces appear normal. No erosive change or bony destruction. There is soft tissue swelling over the dorsum of the foot. There is no soft tissue air or radiopaque foreign body. IMPRESSION: No bony destruction or erosion. No fracture or dislocation. Soft tissue swelling over the dorsal foot. No soft tissue abscess or radiopaque foreign body evident. Electronically Signed   By: Lowella Grip III M.D.    On: 07/24/2015 07:23   Dg Foot Complete Left  07/18/2015  CLINICAL DATA:  Left foot pain with ulcer along the great toe and fourth toe for 2 weeks. Diabetic. EXAM: LEFT FOOT - COMPLETE 3+ VIEW COMPARISON:  06/29/2015 prior exams FINDINGS: There is no evidence of acute fracture, subluxation or dislocation. No radiographic evidence of osteomyelitis identified. No radiopaque foreign bodies are present. Vascular calcifications are again noted. IMPRESSION: No acute bony abnormalities. Electronically Signed   By: Margarette Canada M.D.   On: 07/18/2015 14:21     Reily Fix M.D on 08/17/2015 at 10:52 AM  Between 7am to 7pm - Pager - [phone]  After 7pm go to [email or web address] - password [facility]  [facility] -  Office  [phone]

## 2015-08-18 DIAGNOSIS — Z8673 Personal history of transient ischemic attack (TIA), and cerebral infarction without residual deficits: Secondary | ICD-10-CM | POA: Insufficient documentation

## 2015-08-18 DIAGNOSIS — I739 Peripheral vascular disease, unspecified: Secondary | ICD-10-CM

## 2015-08-18 DIAGNOSIS — D72829 Elevated white blood cell count, unspecified: Secondary | ICD-10-CM | POA: Insufficient documentation

## 2015-08-18 DIAGNOSIS — R000 Tachycardia, unspecified: Secondary | ICD-10-CM | POA: Insufficient documentation

## 2015-08-18 DIAGNOSIS — D62 Acute posthemorrhagic anemia: Secondary | ICD-10-CM

## 2015-08-18 DIAGNOSIS — I10 Essential (primary) hypertension: Secondary | ICD-10-CM

## 2015-08-18 DIAGNOSIS — R6510 Systemic inflammatory response syndrome (SIRS) of non-infectious origin without acute organ dysfunction: Secondary | ICD-10-CM | POA: Insufficient documentation

## 2015-08-18 DIAGNOSIS — N183 Chronic kidney disease, stage 3 (moderate): Secondary | ICD-10-CM

## 2015-08-18 DIAGNOSIS — Z720 Tobacco use: Secondary | ICD-10-CM

## 2015-08-18 DIAGNOSIS — E11621 Type 2 diabetes mellitus with foot ulcer: Secondary | ICD-10-CM

## 2015-08-18 DIAGNOSIS — L97509 Non-pressure chronic ulcer of other part of unspecified foot with unspecified severity: Secondary | ICD-10-CM

## 2015-08-18 DIAGNOSIS — B182 Chronic viral hepatitis C: Secondary | ICD-10-CM | POA: Insufficient documentation

## 2015-08-18 DIAGNOSIS — E871 Hypo-osmolality and hyponatremia: Secondary | ICD-10-CM | POA: Insufficient documentation

## 2015-08-18 LAB — CBC
HCT: 27.7 % — ABNORMAL LOW (ref 39.0–52.0)
Hemoglobin: 8.8 g/dL — ABNORMAL LOW (ref 13.0–17.0)
MCH: 27.4 pg (ref 26.0–34.0)
MCHC: 31.8 g/dL (ref 30.0–36.0)
MCV: 86.3 fL (ref 78.0–100.0)
Platelets: 355 10*3/uL (ref 150–400)
RBC: 3.21 MIL/uL — ABNORMAL LOW (ref 4.22–5.81)
RDW: 14.3 % (ref 11.5–15.5)
WBC: 12.3 10*3/uL — ABNORMAL HIGH (ref 4.0–10.5)

## 2015-08-18 LAB — BASIC METABOLIC PANEL
Anion gap: 7 (ref 5–15)
BUN: 12 mg/dL (ref 6–20)
CHLORIDE: 96 mmol/L — AB (ref 101–111)
CO2: 29 mmol/L (ref 22–32)
CREATININE: 1.08 mg/dL (ref 0.61–1.24)
Calcium: 9.4 mg/dL (ref 8.9–10.3)
GFR calc non Af Amer: >60 mL/min (ref >60)
GLUCOSE: 181 mg/dL — AB (ref 65–99)
Potassium: 3.9 mmol/L (ref 3.5–5.1)
Sodium: 132 mmol/L — ABNORMAL LOW (ref 135–145)

## 2015-08-18 LAB — TYPE AND SCREEN
ABO/RH(D): O POS
Antibody Screen: NEGATIVE
UNIT DIVISION: 0

## 2015-08-18 LAB — GLUCOSE, CAPILLARY
GLUCOSE-CAPILLARY: 158 mg/dL — AB (ref 65–99)
Glucose-Capillary: 200 mg/dL — ABNORMAL HIGH (ref 65–99)
Glucose-Capillary: 210 mg/dL — ABNORMAL HIGH (ref 65–99)
Glucose-Capillary: 153 mg/dL — ABNORMAL HIGH (ref 65–99)

## 2015-08-18 MED ORDER — INSULIN ASPART 100 UNIT/ML ~~LOC~~ SOLN
3.0000 [IU] | Freq: Three times a day (TID) | SUBCUTANEOUS | Status: DC
  Administered 2015-08-18 – 2015-08-21 (×10): 3 [IU] via SUBCUTANEOUS

## 2015-08-18 MED ORDER — INSULIN ASPART 100 UNIT/ML ~~LOC~~ SOLN
4.0000 [IU] | Freq: Three times a day (TID) | SUBCUTANEOUS | Status: DC
  Administered 2015-08-18: 4 [IU] via SUBCUTANEOUS

## 2015-08-18 MED ORDER — DOXYCYCLINE HYCLATE 100 MG PO TABS
100.0000 mg | ORAL_TABLET | Freq: Two times a day (BID) | ORAL | Status: DC
  Administered 2015-08-19 – 2015-08-21 (×5): 100 mg via ORAL
  Filled 2015-08-18 (×5): qty 1

## 2015-08-18 NOTE — Progress Notes (Signed)
Inpatient Rehabilitation  Met with patient to discuss team's recommendation for IP Rehab. Shared booklets, briefly discussed program, and answered questions.  However, patient lethargic with limited ability to maintain arousal.  Plan for my co-worker, Danne Baxter to follow up Monday.  Hopeful for potential admission on Monday pending medical readiness.   Carmelia Roller., CCC/SLP Admission Coordinator  [place]  Cell [phone]

## 2015-08-18 NOTE — Evaluation (Signed)
Occupational Therapy Evaluation Patient Details Name: Cory Weber MRN: [id number] DOB: [birth year] Today's Date: 08/18/2015    History of Present Illness 65 yo M who was recently hospitalized for LLE cellulitis / wounds, d/c on 6/29 (refused SNF), admitted again with LLE wound / wet gangrene worsening and now with maggots, Status post left BKA by Dr. Trula Slade on 08/16/2015. PMH: with history of HTN, PVD, DM2, CKD III, asthma, neuropathy, Hep C, CVA, back surgery.     Clinical Impression   Pt with decline in function and safety with ADLs and ADL mobility with decreased strength, balance and endurance. Pt would benefit from acute OT services to address impairments to increase level of function and safety    Follow Up Recommendations  CIR    Equipment Recommendations  Other (comment) (TBD at next level of care)    Recommendations for Other Services       Precautions / Restrictions Restrictions Weight Bearing Restrictions: Yes LLE Weight Bearing: Non weight bearing      Mobility Bed Mobility               General bed mobility comments: pt up in recliner  Transfers Overall transfer level: Needs assistance Equipment used: Rolling walker (2 wheeled) Transfers: Sit to/from Stand Sit to Stand: Mod assist;From elevated surface         General transfer comment: Attempted sit - stand x 3 at RW from recliner before pt able to come to appropriate standing postion, requring cues to stand upright and use B UEs and cues for hand placement     Balance Overall balance assessment: Needs assistance Sitting-balance support: No upper extremity supported Sitting balance-Leahy Scale: Good     Standing balance support: Bilateral upper extremity supported Standing balance-Leahy Scale: Poor                              ADL Overall ADL's : Needs assistance/impaired     Grooming: Wash/dry hands;Wash/dry face;Set up;Sitting;Min guard   Upper Body Bathing: Supervision/  safety;Set up;Sitting   Lower Body Bathing: Moderate assistance   Upper Body Dressing : Set up;Supervision/safety;Sitting   Lower Body Dressing: Maximal assistance;Sitting/lateral leans Lower Body Dressing Details (indicate cue type and reason): pt educated on compensatory LB dressing techniques Toilet Transfer:  (Pt is mod A with transfers per PT) Toilet Transfer Details (indicate cue type and reason): Ubable to SPT to [facility]. Attempted sit - stand x 3 at RW from recliner before pt able to come to appropriate standing postion, requring cues to stand upright and use B UEs and cues for hand placement          Functional mobility during ADLs:  (Pt is mod A with transfers per PT)       Vision  wears reading glasses, no change in baseline              Pertinent Vitals/Pain Pain Assessment: 0-10 Pain Score: 6  Pain Location: L LE Pain Descriptors / Indicators: Aching;Sore Pain Intervention(s): Limited activity within patient's tolerance;Monitored during session;Repositioned     Hand Dominance Right   Extremity/Trunk Assessment Upper Extremity Assessment Upper Extremity Assessment: Generalized weakness   Lower Extremity Assessment Lower Extremity Assessment: Defer to PT evaluation       Communication Communication Communication: No difficulties   Cognition Arousal/Alertness: Awake/alert Behavior During Therapy: WFL for tasks assessed/performed Overall Cognitive Status: Within Functional Limits for tasks assessed  General Comments   Pt pleasant, cooperative and [place] expects to be discharged to:: Inpatient rehab Living Arrangements: Alone Available Help at Discharge: Family Type of Home: Apartment Home Access: Level entry     Home Layout: One level     Bathroom Shower/Tub: Occupational psychologist: Handicapped height     Home Equipment: Environmental consultant - 2 wheels;Bedside  commode;Cane - single point;Shower seat - built in;Grab bars - toilet;Grab bars - tub/shower   Additional Comments: pt reports that he thinks that he will need to go somewhere for more therapy and help before going back home.       Prior Functioning/Environment Level of Independence: Independent with assistive device(s)        Comments: using rw    OT Diagnosis: Generalized weakness;Acute pain   OT Problem List: Pain;Impaired balance (sitting and/or standing);Decreased activity tolerance;Decreased knowledge of use of DME or AE   OT Treatment/Interventions: Self-care/ADL training;Patient/family education;Therapeutic activities;DME and/or AE instruction    OT Goals(Current goals can be found in the care plan section) Acute Rehab OT Goals Patient Stated Goal: be able to eventually get back home OT Goal Formulation: With patient/family Time For Goal Achievement: 08/25/15 Potential to Achieve Goals: Good ADL Goals Pt Will Perform Grooming: with supervision;with set-up;sitting (unsupported) Pt Will Perform Lower Body Bathing: with min assist;sitting/lateral leans Pt Will Perform Lower Body Dressing: with mod assist;sitting/lateral leans Pt Will Transfer to Toilet: with mod assist;with min assist;bedside commode Pt Will Perform Toileting - Clothing Manipulation and hygiene: with mod assist;sitting/lateral leans Pt Will Perform Tub/Shower Transfer: with min assist;3 in 1  OT Frequency: Min 2X/week   Barriers to D/C:  decreased caregiver assist                        End of Session Equipment Utilized During Treatment: Gait belt;Rolling walker  Activity Tolerance: Patient limited by fatigue Patient left: in chair;with call bell/phone within reach;with family/visitor present   Time: 1232-1258 OT Time Calculation (min): 26 min Charges:  OT General Charges $OT Visit: 1 Procedure OT Evaluation $OT Eval Moderate Complexity: 1 Procedure OT Treatments $Therapeutic Activity:  8-22 mins G-Codes:    Britt Bottom 08/18/2015, 2:41 PM

## 2015-08-18 NOTE — Progress Notes (Signed)
    Subjective  - POD #2  S/p L BKA Pain controlled   Physical Exam:  Dressing removed.  Incision is clean with mild drainage    Assessment/Plan:  POD #2  Wound appears to be healing appropriately Can go to Rehab when medically cleared  Isaia Hassell, [place] 08/18/2015 10:53 PM --  Filed Vitals:   08/18/15 1500 08/18/15 2058  BP: 96/64 120/72  Pulse: 98 97  Temp: 98.7 F (37.1 C) 100 F (37.8 C)  Resp:  18    Intake/Output Summary (Last 24 hours) at 08/18/15 2253 Last data filed at 08/18/15 1851  Gross per 24 hour  Intake    480 ml  Output   1300 ml  Net   -820 ml     Laboratory CBC    Component Value Date/Time   WBC 12.3* 08/18/2015 0628   HGB 8.8* 08/18/2015 0628   HCT 27.7* 08/18/2015 0628   HCT 32 10/31/2011 1319   PLT 355 08/18/2015 0628    BMET    Component Value Date/Time   NA 132* 08/18/2015 0628   NA 140 10/31/2011 1319   K 3.9 08/18/2015 0628   K 4.0 10/31/2011 1319   CL 96* 08/18/2015 0628   CO2 29 08/18/2015 0628   GLUCOSE 181* 08/18/2015 0628   BUN 12 08/18/2015 0628   BUN 10 10/31/2011 1319   CREATININE 1.08 08/18/2015 0628   CREATININE 0.99 10/31/2011 1319   CALCIUM 9.4 08/18/2015 0628   CALCIUM 9.0 10/31/2011 1319   GFRNONAA >60 08/18/2015 0628   GFRAA >60 08/18/2015 0628    COAG Lab Results  Component Value Date   INR 1.35 08/08/2015   INR 1.33 07/25/2015   INR 0.97 02/17/2015   No results found for: PTT  Antibiotics Anti-infectives    Start     Dose/Rate Route Frequency Ordered Stop   08/19/15 1000  doxycycline (VIBRA-TABS) tablet 100 mg     100 mg Oral Every 12 hours 08/18/15 0825     08/15/15 1200  vancomycin (VANCOCIN) IVPB 1000 mg/200 mL premix     1,000 mg 200 mL/hr over 60 Minutes Intravenous Every 12 hours 08/15/15 0306 08/18/15 1254   08/15/15 0800  piperacillin-tazobactam (ZOSYN) IVPB 3.375 g     3.375 g 12.5 mL/hr over 240 Minutes Intravenous Every 8 hours 08/15/15 0306 08/18/15 2359   08/15/15 0030   vancomycin (VANCOCIN) IVPB 1000 mg/200 mL premix     1,000 mg 200 mL/hr over 60 Minutes Intravenous  Once 08/15/15 0025 08/15/15 0212   08/15/15 0030  piperacillin-tazobactam (ZOSYN) IVPB 3.375 g     3.375 g 100 mL/hr over 30 Minutes Intravenous  Once 08/15/15 0025 08/15/15 0243       V. Leia Alf, M.D. [facility] [place] Office: [phone] Pager:  [phone]

## 2015-08-18 NOTE — Progress Notes (Signed)
Orthopedic Tech Progress Note Patient Details:  Cory Weber May 10, 1950 [id number]  Patient ID: [name], male   DOB: 1950/05/15, 65 y.o.   MRN: [id number]   [place] Pink 08/18/2015, 8:47 AMCalled [employer] for left BKA.

## 2015-08-18 NOTE — Evaluation (Signed)
Physical Therapy Evaluation Patient Details Name: Cory Weber MRN: [id number] DOB: 1950-03-17 Today's Date: 08/18/2015   History of Present Illness  65 yo M who was recently hospitalized for LLE cellulitis / wounds, d/c on 6/29 (refused SNF), admitted again with LLE wound / wet gangrene worsening and now with maggots, Status post left BKA by Dr. Trula Slade on 08/16/2015. PMH: with history of HTN, PVD, DM2, CKD III, asthma, neuropathy, Hep C, CVA, back surgery.    Clinical Impression  Patient is s/p above surgery resulting in functional limitations due to the deficits listed below (see PT Problem List).  Patient will benefit from skilled PT to increase their independence and safety with mobility. Based upon the patient's current mobility, recommending CIR evaluation for further rehabilitation following acute stay.        Follow Up Recommendations CIR    Equipment Recommendations  Wheelchair (measurements PT);Wheelchair cushion (measurements PT)    Recommendations for Other Services Rehab consult     Precautions / Restrictions Precautions Precautions: Fall Restrictions Weight Bearing Restrictions: Yes LLE Weight Bearing: Non weight bearing      Mobility  Bed Mobility Overal bed mobility: Needs Assistance Bed Mobility: Supine to Sit     Supine to sit: Supervision     General bed mobility comments: HOB elevated, using rails to assist  Transfers Overall transfer level: Needs assistance Equipment used: Rolling walker (2 wheeled) Transfers: Sit to/from Stand Sit to Stand: Mod assist;From elevated surface         General transfer comment: assist needed to come to standing as well as cues for hand placement and stabolization of rw. When sitting pt releasing rw and reaching for chair prior to turning, pt not following commands related to safety.   Ambulation/Gait Ambulation/Gait assistance: Min assist Ambulation Distance (Feet): 7 Feet Assistive device: Rolling walker (2  wheeled) Gait Pattern/deviations:  (hop-to pattern) Gait velocity: slow pattern   General Gait Details: as pt fatigued, pt safety awareness and compliance with cues for safety decreased. Having to bring chair from behind and assist pt to sitting.   Stairs            Wheelchair Mobility    Modified Rankin (Stroke Patients Only)       Balance Overall balance assessment: Needs assistance Sitting-balance support: No upper extremity supported Sitting balance-Leahy Scale: Good     Standing balance support: Bilateral upper extremity supported Standing balance-Leahy Scale: Poor Standing balance comment: using rw and intermittent physical assist                             Pertinent Vitals/Pain Pain Assessment: 0-10 Pain Score: 8  Pain Location: Lt leg Pain Descriptors / Indicators: Sore;Aching Pain Intervention(s): Limited activity within patient's tolerance;Monitored during session    Home Living Family/patient expects to be discharged to:: Skilled nursing facility Living Arrangements: Alone     Home Access: Level entry     Home Layout: One level Home Equipment: Walker - 2 wheels;Bedside commode;Cane - single point Additional Comments: pt reports that he thinks that he will need to go somewhere for more therapy and help before going back home.     Prior Function Level of Independence: Independent with assistive device(s)         Comments: using rw     Hand Dominance        Extremity/Trunk Assessment   Upper Extremity Assessment: Overall WFL for tasks assessed  Lower Extremity Assessment: LLE deficits/detail   LLE Deficits / Details: pt able to move LLE actively with bed mobility.      Communication   Communication: No difficulties  Cognition Arousal/Alertness: Awake/alert Behavior During Therapy: Impulsive Overall Cognitive Status: Within Functional Limits for tasks assessed                      General  Comments General comments (skin integrity, edema, etc.): educated pt on need for elevation and knee extension    Exercises        Assessment/Plan    PT Assessment Patient needs continued PT services  PT Diagnosis Difficulty walking   PT Problem List Decreased strength;Decreased range of motion;Decreased activity tolerance;Decreased balance;Decreased mobility;Decreased safety awareness  PT Treatment Interventions DME instruction;Gait training;Functional mobility training;Therapeutic activities;Therapeutic exercise;Balance training;Patient/family education   PT Goals (Current goals can be found in the Care Plan section) Acute Rehab PT Goals Patient Stated Goal: be able to eventually get back home PT Goal Formulation: With patient Time For Goal Achievement: 09/01/15 Potential to Achieve Goals: Good    Frequency Min 3X/week   Barriers to discharge        Co-evaluation               End of Session Equipment Utilized During Treatment: Gait belt Activity Tolerance: Patient limited by fatigue Patient left: in chair;with call bell/phone within reach (LLE elevated) Nurse Communication: Mobility status         Time: [id number] PT Time Calculation (min) (ACUTE ONLY): 27 min   Charges:   PT Evaluation $PT Eval Moderate Complexity: 1 Procedure PT Treatments $Gait Training: 8-22 mins   PT G Codes:        Cassell Clement, PT, CSCS Pager [phone] Office [phone]  08/18/2015, 9:54 AM

## 2015-08-18 NOTE — Consult Note (Signed)
Physical Medicine and Rehabilitation Consult Reason for Consult: Left BKA Referring Physician: [facility]   HPI: Cory Weber is a 65 y.o. right handed male with history of hypertension, tobacco abuse, diabetes mellitus, hepatitis C, CVA 2015 without residual maintained on aspirin and chronic renal insufficiency with creatinine 1.34 and peripheral vascular disease. Patient lives alone and was independent with assistive device prior to admission. He has a personal care attendant 3 hours a day 7 days a week to help with meals groceries and cleaning. 1 level apartment steps to entry. Presented 08/15/2015 after recent left popliteal angioplasty one week ago and was discharged to home 08/10/2015 ambulating 100 feet with minimal assistance and noted a progressive pain with gangrenous changes to the left foot. Patient findings of maggots in the wound while changing. The leg was deemed nonsalvageable and underwent left BKA 08/16/2015 per Dr. Trula Slade. [facility] course pain management. Mild leukocytosis 12,300 as well as low-grade fever and monitored. Acute blood loss anemia 8.8 and monitored. Subcutaneous heparin for DVT prophylaxis. Physical therapy evaluation completed with recommendations of physical medicine rehabilitation consult.   Review of Systems  Constitutional: Negative for fever and chills.  HENT: Negative for hearing loss.   Eyes: Negative for blurred vision and double vision.  Respiratory: Positive for shortness of breath. Negative for cough.   Cardiovascular: Positive for leg swelling. Negative for chest pain and palpitations.  Gastrointestinal: Positive for constipation. Negative for nausea and vomiting.  Genitourinary: Positive for urgency. Negative for dysuria and hematuria.  Musculoskeletal: Positive for myalgias.  Skin: Negative for rash.  Neurological: Positive for weakness. Negative for seizures, loss of consciousness and headaches.  All other systems reviewed and are  negative.  Past Medical History  Diagnosis Date  . Hypertension   . Hyperlipidemia   . Asthma   . PVD (peripheral vascular disease) ([place])   . Anemia   . Peripheral neuropathy ([place])   . Cellulitis and abscess of foot 07/2015  . Pneumonia X 1  . Type II diabetes mellitus ([place])   . Hepatitis C     states he was diagnosed in 2007 or 2007 while living in [place], [place]  . CVA (cerebral vascular accident) [facility]) 2015    denies residual on 08/15/2015   Past Surgical History  Procedure Laterality Date  . Liver biopsy  2005    Done in [place], [place]. Chronic hepatitis with mild periportal inflammation, lobular unicellular necrosis and portal fibrosis. Grade 2, stage 1-2.  Marland Kitchen Colonoscopy with propofol N/A 09/03/2012    [id number] polyp-removed as outlined above. Prominent internal hemorrhoids. Tubular adenoma  . Esophagogastroduodenoscopy (egd) with propofol N/A 09/03/2012    [id number] hernia. Gastric diverticulum. Gastric ulcers with associated erosions. Duodenal erosions. Status post gastric biopsy. H.PYLORI gastritis   . Biopsy N/A 09/03/2012    Procedure: BIOPSY;  Surgeon: Daneil Dolin, MD;  Location: [facility];  Service: Endoscopy;  Laterality: N/A;  gastric and gastric mucosa  . Polypectomy N/A 09/03/2012    Procedure: POLYPECTOMY;  Surgeon: Daneil Dolin, MD;  Location: [facility];  Service: Endoscopy;  Laterality: N/A;  cecal polyp  . Esophagogastroduodenoscopy (egd) with propofol N/A 12/03/2012    Dr. Gala Romney: gastric diverticulum, gastric erosions and scar. Previously noted gastric ulcer completed healed. Biopsy without H.pylori.   . Biopsy N/A 12/03/2012    Procedure: BIOPSY;  Surgeon: Daneil Dolin, MD;  Location: [facility];  Service: Endoscopy;  Laterality: N/A;  . Maximum access (mas)posterior lumbar interbody fusion (plif) 1 level  N/A 11/25/2013    Procedure: FOR MAXIMUM ACCESS (MAS) POSTERIOR LUMBAR INTERBODY FUSION (PLIF) 1 LEVEL;  Surgeon: Eustace Moore, MD;  Location: [place] [facility]UR[facility] ORS;   Service: Neurosurgery;  Laterality: N/A;  FOR MAXIMUM ACCESS (MAS) POSTERIOR LUMBAR INTERBODY FUSION (PLIF) 1 LEVEL LUMBAR 3-4  . Peripheral vascular catheterization Left 08/01/2015    Procedure: Lower Extremity Angiography;  Surgeon: Serafina Mitchell, MD;  Location: [place] CV LAB;  Service: Cardiovascular;  Laterality: Left;  . Peripheral vascular catheterization N/A 08/01/2015    Procedure: Abdominal Aortogram;  Surgeon: Serafina Mitchell, MD;  Location: [place] [facility];  Service: Cardiovascular;  Laterality: N/A;  . Peripheral vascular catheterization N/A 08/08/2015    Procedure: Abdominal Aortogram w/Lower Extremity;  Surgeon: Serafina Mitchell, MD;  Location: [place] [facility] LAB;  Service: Cardiovascular;  Laterality: N/A;  . Peripheral vascular catheterization Left 08/08/2015    Procedure: Peripheral Vascular Balloon Angioplasty;  Surgeon: Serafina Mitchell, MD;  Location: [place] [facility] LAB;  Service: Cardiovascular;  Laterality: Left;  left popiteal artery, left peronealtrunk, left post tibial  . Tonsillectomy    . Back surgery    . Amputation Left 08/16/2015    Procedure: LEFT BELOW THE KNEE AMPUTATION ;  Surgeon: Serafina Mitchell, MD;  Location: [facility];  Service: Vascular;  Laterality: Left;   Family History  Problem Relation Age of Onset  . Breast cancer Mother     deceased  . Cancer Mother   . Diabetes Father   . Hypertension Father   . Heart disease Father     deceased  . Hyperlipidemia Father   . Diabetes Sister   . Hypertension Sister   . Breast cancer Sister   . Colon cancer Neg Hx   . Liver disease Neg Hx   . Heart failure Mother    Social History:  reports that he quit smoking about 5 weeks ago. His smoking use included Cigarettes. He has a 47 pack-year smoking history. He has never used smokeless tobacco. He reports that he drinks alcohol. He reports that he uses illicit drugs (Cocaine). Allergies: No Known Allergies Medications Prior to Admission  Medication Sig Dispense  Refill  . aspirin EC 81 MG tablet Take 1 tablet (81 mg total) by mouth daily. 100 tablet 3  . carvedilol (COREG) 3.125 MG tablet Take 1 tablet (3.125 mg total) by mouth 2 (two) times daily with a meal. 60 tablet 0  . famotidine (PEPCID) 20 MG tablet Take 1 tablet (20 mg total) by mouth daily. 30 tablet 0  . folic acid (FOLVITE) 1 MG tablet Take 1 tablet (1 mg total) by mouth daily. 30 tablet 0  . gabapentin (NEURONTIN) 300 MG capsule Take 1 capsule (300 mg total) by mouth 2 (two) times daily. 60 capsule 1  . glipiZIDE (GLUCOTROL) 5 MG tablet Take 5 mg by mouth 2 (two) times daily before a meal.     . lisinopril-hydrochlorothiazide (PRINZIDE,ZESTORETIC) 20-12.5 MG per tablet Take 1 tablet by mouth 2 (two) times daily. 30 tablet 1  . metFORMIN (GLUCOPHAGE) 1000 MG tablet Take 1 tablet (1,000 mg total) by mouth 2 (two) times daily with a meal. 60 tablet 1  . Multiple Vitamin (MULTIVITAMIN WITH MINERALS) TABS tablet Take 1 tablet by mouth daily. 30 tablet 0  . oxyCODONE-acetaminophen (PERCOCET/ROXICET) 5-325 MG tablet Take 2 tablets by mouth every 6 (six) hours as needed for severe pain. 20 tablet 0  . silver sulfADIAZINE (SILVADENE) 1 % cream Apply topically daily. (  Patient taking differently: Apply 1 application topically daily. ) 50 g 0  . tamsulosin (FLOMAX) 0.4 MG CAPS capsule Take 1 capsule (0.4 mg total) by mouth daily. 30 capsule 0  . thiamine 100 MG tablet Take 1 tablet (100 mg total) by mouth daily. 30 tablet 0    Home: Home Living Family/patient expects to be discharged to:: Private residence Living Arrangements: Alone  Functional History:   Functional Status:  Mobility:          ADL:    Cognition: Cognition Orientation Level: Oriented X4    Blood pressure 128/85, pulse 102, temperature 100.4 F (38 C), temperature source Oral, resp. rate 16, height 6\' 1"  (1.854 m), weight 90.3 kg (199 lb 1.2 oz), SpO2 98 %. Physical Exam  Vitals reviewed. Constitutional: He is  oriented to person, place, and time. He appears well-developed and well-nourished.  HENT:  Head: Normocephalic and atraumatic.  Eyes: Right eye exhibits no discharge. Left eye exhibits no discharge.  Pupils reactive to light  Neck: Normal range of motion. Neck supple. No thyromegaly present.  Cardiovascular: Normal rate and regular rhythm.   Respiratory: Effort normal and breath sounds normal. No respiratory distress.  GI: Soft. Bowel sounds are normal. He exhibits no distension.  Musculoskeletal: He exhibits edema and tenderness.  Neurological: He is alert and oriented to person, place, and time.  Motor: B/l UE, RLE: 5/5 proximal to distal LLE: 0/5 (unwilling to move, stating, he can't)  Skin: Skin is warm and dry.  Amputation site is dressed (c/d/i) appropriately tender  Psychiatric: He has a normal mood and affect. His behavior is normal.    Results for orders placed or performed during the hospital encounter of 08/14/15 (from the past 24 hour(s))  Glucose, capillary     Status: Abnormal   Collection Time: 08/17/15  6:26 AM  Result Value Ref Range   Glucose-Capillary 197 (H) 65 - 99 mg/dL  Prepare RBC     Status: None   Collection Time: 08/17/15 10:00 AM  Result Value Ref Range   Order Confirmation ORDER PROCESSED BY BLOOD BANK   Type and screen [place]     Status: None (Preliminary result)   Collection Time: 08/17/15 10:00 AM  Result Value Ref Range   ABO/RH(D) O POS    Antibody Screen NEG    Sample Expiration 08/20/2015    Unit Number [id number]    Blood Component Type RBC LR PHER2    Unit division 00    Status of Unit ISSUED    Transfusion Status OK TO TRANSFUSE    Crossmatch Result Compatible   Glucose, capillary     Status: Abnormal   Collection Time: 08/17/15 11:40 AM  Result Value Ref Range   Glucose-Capillary 283 (H) 65 - 99 mg/dL  Glucose, capillary     Status: Abnormal   Collection Time: 08/17/15  5:11 PM  Result Value Ref Range    Glucose-Capillary 235 (H) 65 - 99 mg/dL  Glucose, capillary     Status: Abnormal   Collection Time: 08/17/15  9:49 PM  Result Value Ref Range   Glucose-Capillary 147 (H) 65 - 99 mg/dL   Comment 1 Notify RN    No results found.  Assessment/Plan: Diagnosis: Left BKA Labs and images independently reviewed.  Records reviewed and summated above. Clean amputation daily with soap and water Monitor incision site for signs of infection or impending skin breakdown. Staples to remain in place for 3-4 weeks Stump shrinker, for edema control  Scar mobilization massaging to prevent soft tissue adherence Stump protector during therapies Prevent flexion contractures by implementing the following:   Encourage prone lying for 20-30 mins per day BID to avoid hip flexion  Contractures if medically appropriate;  Avoid pillow under knees when patient is lying in bed in order to prevent both  knee and hip flexion contractures;  Avoid prolonged sitting Post surgical pain control with oral medication Phantom limb pain control with physical modalities including desensitization techniques (gentle self massage to the residual stump,hot packs if sensation iintact, [place]) and mirror therapy, TENS. If ineffective, consider pharmacological treatment for neuropathic pain (e.g gabapentin, pregabalin, amytriptalyine, duloxetine).  When using wheelchair, patient should have knee on amputated side fully extended with board under the seat cushion. Avoid injury to contralateral side  1. Does the need for close, 24 hr/day medical supervision in concert with the patient's rehab needs make it unreasonable for this patient to be served in a less intensive setting? Yes  2. Co-Morbidities requiring supervision/potential complications: HTN (monitor and provide prns in accordance with increased physical exertion and pain), tobacco abuse (counsel), diabetes mellitus (Monitor in accordance with exercise and adjust meds as necessary),  hepatitis C (avoid hepatotoxic meds), CVA 2015 without residual deficits (cont meds), chronic renal insufficiency (avoid nephrotoxic meds) peripheral vascular disease (cont meds), leukocytosis (cont to monitor for signs and symptoms of infection, further workup if indicated), low-grade fever (cont to monitor), Acute blood loss anemia (transfuse if necessary to ensure appropriate perfusion for increased activity tolerance), Tachycardia (monitor in accordance with pain and increasing activity), hyponatremia (cont to monitor, treat if necessary), SIRS (see above) 3. Due to safety, skin/wound care, disease management, pain management and patient education, does the patient require 24 hr/day rehab nursing? Yes 4. Does the patient require coordinated care of a physician, rehab nurse, PT (1-2 hrs/day, 5 days/week) and OT (1-2 hrs/day, 5 days/week) to address physical and functional deficits in the context of the above medical diagnosis(es)? Yes Addressing deficits in the following areas: balance, endurance, locomotion, strength, transferring, bathing, dressing, toileting and psychosocial support 5. Can the patient actively participate in an intensive therapy program of at least 3 hrs of therapy per day at least 5 days per week? Yes 6. The potential for patient to make measurable gains while on inpatient rehab is excellent 7. Anticipated functional outcomes upon discharge from inpatient rehab are Mod I at wheelchair level  with PT, Mod I at wheelchair level with OT, n/a with SLP. 8. Estimated rehab length of stay to reach the above functional goals is: 8-12 days. 9. Does the patient have adequate social supports and living environment to accommodate these discharge functional goals? Yes 10. Anticipated D/C setting: Home 11. Anticipated post D/C treatments: HH therapy and Home excercise program 12. Overall Rehab/Functional Prognosis: good  RECOMMENDATIONS: This patient's condition is appropriate for continued  rehabilitative care in the following setting: CIR once medically stable and afebrile Patient has agreed to participate in recommended program. Yes Note that insurance prior authorization may be required for reimbursement for recommended care.  Comment: Rehab Admissions Coordinator to follow up.  Delice Lesch, MD 08/18/2015

## 2015-08-18 NOTE — H&P (Signed)
Physical Medicine and Rehabilitation Admission H&P    Chief Complaint  Patient presents with  . Wound Infection  : HPI: Cory Weber is a 65 y.o. right handed male with history of hypertension, tobacco abuse, diabetes mellitus, hepatitis C, CVA 2015 without residual maintained on aspirin and chronic renal insufficiency with creatinine 1.34 and peripheral vascular disease. Patient lives alone and was independent with assistive device prior to admission. He has a personal care attendant 3 hours a day 7 days a week to help with meals groceries and cleaning. 1 level apartment with no  steps  entry Presented 08/15/2015 after recent left popliteal angioplasty one week ago and was discharged to home 08/10/2015 ambulating 100 feet with minimal assistance and noted a progressive Pain with gangrenous changes to the left foot. Patient noted while changing dressing with findings of maggots in the wound. The leg was deemed nonsalvageable and underwent left BKA 08/16/2015 per Dr. Trula Slade. [facility] course pain management.Pre-prosthetic training with [employer] prosthetics. Mild leukocytosis 12,300 Improved to 10,700 as well as low-grade fever 100.6 and monitored with IV antibiotics completed 08/18/2015 and addition of Doxycycline 08/19/2015 . Acute blood loss anemia 8.7 and monitored. Subcutaneous heparin for DVT prophylaxis. PhysicalAnd occupational therapy evaluations completed with recommendations of physical medicine rehabilitation consult.Patient was admitted for comprehensive rehabilitation program  ROS Constitutional: Negative for fever and chills.  HENT: Negative for hearing loss.  Eyes: Negative for blurred vision and double vision.  Respiratory: Negative for cough.  Cardiovascular: Positive for leg swelling. Negative for chest pain and palpitations.  Gastrointestinal: Positive for constipation. Negative for nausea and vomiting.  Genitourinary: Positive for urgency. Negative for dysuria and  hematuria.  Musculoskeletal: Positive for myalgias and joint pain.  Skin: Negative for rash.  Neurological: Positive for weakness. Negative for seizures, loss of consciousness and headaches.  All other systems reviewed and are negative   Past Medical History  Diagnosis Date  . Hypertension   . Hyperlipidemia   . Asthma   . PVD (peripheral vascular disease) ([place])   . Anemia   . Peripheral neuropathy ([place])   . Cellulitis and abscess of foot 07/2015  . Pneumonia X 1  . Type II diabetes mellitus ([place])   . Hepatitis C     states he was diagnosed in 2007 or 2007 while living in [place], [place]  . CVA (cerebral vascular accident) [facility]) 2015    denies residual on 08/15/2015   Past Surgical History  Procedure Laterality Date  . Liver biopsy  2005    Done in [place], [place]. Chronic hepatitis with mild periportal inflammation, lobular unicellular necrosis and portal fibrosis. Grade 2, stage 1-2.  [facility] Kitchen Colonoscopy with propofol N/A 09/03/2012    XLK:GMWNUUV polyp-removed as outlined above. Prominent internal hemorrhoids. Tubular adenoma  . Esophagogastroduodenoscopy (egd) with propofol N/A 09/03/2012    OZD:GUYQIH hernia. Gastric diverticulum. Gastric ulcers with associated erosions. Duodenal erosions. Status post gastric biopsy. H.PYLORI gastritis   . Biopsy N/A 09/03/2012    Procedure: BIOPSY;  Surgeon: Daneil Dolin, MD;  Location: [facility];  Service: Endoscopy;  Laterality: N/A;  gastric and gastric mucosa  . Polypectomy N/A 09/03/2012    Procedure: POLYPECTOMY;  Surgeon: Daneil Dolin, MD;  Location: [facility];  Service: Endoscopy;  Laterality: N/A;  cecal polyp  . Esophagogastroduodenoscopy (egd) with propofol N/A 12/03/2012    Dr. Gala Romney: gastric diverticulum, gastric erosions and scar. Previously noted gastric ulcer completed healed. Biopsy without H.pylori.   . Biopsy N/A 12/03/2012  Procedure: BIOPSY;  Surgeon: Daneil Dolin, MD;  Location: [facility];  Service: Endoscopy;  Laterality: N/A;  .  Maximum access (mas)posterior lumbar interbody fusion (plif) 1 level N/A 11/25/2013    Procedure: FOR MAXIMUM ACCESS (MAS) POSTERIOR LUMBAR INTERBODY FUSION (PLIF) 1 LEVEL;  Surgeon: Eustace Moore, MD;  Location: [facility] NEURO ORS;  Service: Neurosurgery;  Laterality: N/A;  FOR MAXIMUM ACCESS (MAS) POSTERIOR LUMBAR INTERBODY FUSION (PLIF) 1 LEVEL LUMBAR 3-4  . Peripheral vascular catheterization Left 08/01/2015    Procedure: Lower Extremity Angiography;  Surgeon: Serafina Mitchell, MD;  Location: [facility] CV LAB;  Service: Cardiovascular;  Laterality: Left;  . Peripheral vascular catheterization N/A 08/01/2015    Procedure: Abdominal Aortogram;  Surgeon: Serafina Mitchell, MD;  Location: [facility] CV LAB;  Service: Cardiovascular;  Laterality: N/A;  . Peripheral vascular catheterization N/A 08/08/2015    Procedure: Abdominal Aortogram w/Lower Extremity;  Surgeon: Serafina Mitchell, MD;  Location: [facility] CV LAB;  Service: Cardiovascular;  Laterality: N/A;  . Peripheral vascular catheterization Left 08/08/2015    Procedure: Peripheral Vascular Balloon Angioplasty;  Surgeon: Serafina Mitchell, MD;  Location: [facility] CV LAB;  Service: Cardiovascular;  Laterality: Left;  left popiteal artery, left peronealtrunk, left post tibial  . Tonsillectomy    . Back surgery    . Amputation Left 08/16/2015    Procedure: LEFT BELOW THE KNEE AMPUTATION ;  Surgeon: Serafina Mitchell, MD;  Location: [facility] OR;  Service: Vascular;  Laterality: Left;   Family History  Problem Relation Age of Onset  . Breast cancer Mother     deceased  . Cancer Mother   . Diabetes Father   . Hypertension Father   . Heart disease Father     deceased  . Hyperlipidemia Father   . Diabetes Sister   . Hypertension Sister   . Breast cancer Sister   . Colon cancer Neg Hx   . Liver disease Neg Hx   . Heart failure Mother    Social History:  reports that he quit smoking about 5 weeks ago. His smoking use included Cigarettes. He has a 47  pack-year smoking history. He has never used smokeless tobacco. He reports that he drinks alcohol. He reports that he uses illicit drugs (Cocaine). Allergies: No Known Allergies Medications Prior to Admission  Medication Sig Dispense Refill  . aspirin EC 81 MG tablet Take 1 tablet (81 mg total) by mouth daily. 100 tablet 3  . carvedilol (COREG) 3.125 MG tablet Take 1 tablet (3.125 mg total) by mouth 2 (two) times daily with a meal. 60 tablet 0  . famotidine (PEPCID) 20 MG tablet Take 1 tablet (20 mg total) by mouth daily. 30 tablet 0  . folic acid (FOLVITE) 1 MG tablet Take 1 tablet (1 mg total) by mouth daily. 30 tablet 0  . gabapentin (NEURONTIN) 300 MG capsule Take 1 capsule (300 mg total) by mouth 2 (two) times daily. 60 capsule 1  . glipiZIDE (GLUCOTROL) 5 MG tablet Take 5 mg by mouth 2 (two) times daily before a meal.     . lisinopril-hydrochlorothiazide (PRINZIDE,ZESTORETIC) 20-12.5 MG per tablet Take 1 tablet by mouth 2 (two) times daily. 30 tablet 1  . metFORMIN (GLUCOPHAGE) 1000 MG tablet Take 1 tablet (1,000 mg total) by mouth 2 (two) times daily with a meal. 60 tablet 1  . Multiple Vitamin (MULTIVITAMIN WITH MINERALS) TABS tablet Take 1 tablet by mouth daily. 30 tablet 0  . oxyCODONE-acetaminophen (PERCOCET/ROXICET) 5-325 MG  tablet Take 2 tablets by mouth every 6 (six) hours as needed for severe pain. 20 tablet 0  . silver sulfADIAZINE (SILVADENE) 1 % cream Apply topically daily. (Patient taking differently: Apply 1 application topically daily. ) 50 g 0  . tamsulosin (FLOMAX) 0.4 MG CAPS capsule Take 1 capsule (0.4 mg total) by mouth daily. 30 capsule 0  . thiamine 100 MG tablet Take 1 tablet (100 mg total) by mouth daily. 30 tablet 0    Home: Home Living Family/patient expects to be discharged to:: Skilled nursing facility Living Arrangements: Alone Available Help at Discharge: [place] Type of Home: Apartment Home Access: Level entry Home Layout: One  level Bathroom Shower/Tub: Multimedia programmer: Handicapped height Home Equipment: Environmental consultant - 2 wheels, Bedside commode, Cane - single point, Shower seat - built in, [employer] - toilet, Grab bars - tub/shower Additional Comments: pt reports that he thinks that he will need to go somewhere for more therapy and help before going back home.    Functional History: Prior Function Level of Independence: Independent with assistive device(s) Comments: using rw  Functional Status:  Mobility: Bed Mobility Overal bed mobility: Needs Assistance Bed Mobility: Supine to Sit Supine to sit: Supervision General bed mobility comments: pt up in recliner Transfers Overall transfer level: Needs assistance Equipment used: Rolling walker (2 wheeled) Transfers: Sit to/from Stand Sit to Stand: Mod assist, From elevated surface General transfer comment: assist needed to come to standing as well as cues for hand placement and stabolization of rw. When sitting pt releasing rw and reaching for chair prior to turning, pt not following commands related to safety.  Ambulation/Gait Ambulation/Gait assistance: Min assist Ambulation Distance (Feet): 7 Feet Assistive device: Rolling walker (2 wheeled) Gait Pattern/deviations:  (hop-to pattern) General Gait Details: as pt fatigued, pt safety awareness and compliance with cues for safety decreased. Having to bring chair from behind and assist pt to sitting.  Gait velocity: slow pattern    ADL:    Cognition: Cognition Overall Cognitive Status: Within Functional Limits for tasks assessed Orientation Level: Oriented X4 Cognition Arousal/Alertness: Awake/alert Behavior During Therapy: Impulsive Overall Cognitive Status: Within Functional Limits for tasks assessed  Physical Exam: Blood pressure 150/89, pulse 51, temperature 99.8 F (37.7 C), temperature source Oral, resp. rate 16, height _0  (1.854 m), weight 90.3 kg (199 lb 1.2 oz), SpO2 99  %. Physical Exam Constitutional: He is oriented to person, place, and time. He appears well-developed and well-nourished.  HENT:  Head: Normocephalic and atraumatic.  Eyes: Right eye exhibits no discharge. Left eye exhibits no discharge.  Pupils reactive to light  Neck: Normal range of motion. Neck supple. No thyromegaly present.  Cardiovascular: Normal rate and regular rhythm.  Respiratory: Effort normal and breath sounds normal. No respiratory distress.  GI: Soft. Bowel sounds are normal. He exhibits no distension.  Musculoskeletal: He exhibits edema and tenderness.  Neurological: He is alert and oriented to person, place, and time.  Motor: B/l UE, RLE: 5/5 proximal to distal LLE: 3-/5  (pain inhibition) Skin: Skin is warm and dry.  Amputation site is dressed (c/d/i) appropriately tender  Psychiatric: He has a normal mood and affect. His behavior is normal.   Results for orders placed or performed during the hospital encounter of 08/14/15 (from the past 48 hour(s))  CBC     Status: Abnormal   Collection Time: 08/16/15  4:14 PM  Result Value Ref Range   WBC 11.0 (H) 4.0 - 10.5 K/uL  RBC 2.94 (L) 4.22 - 5.81 MIL/uL   Hemoglobin 8.0 (L) 13.0 - 17.0 g/dL   HCT 26.0 (L) 39.0 - 52.0 %   MCV 88.4 78.0 - 100.0 fL   MCH 27.2 26.0 - 34.0 pg   MCHC 30.8 30.0 - 36.0 g/dL   RDW 14.4 11.5 - 15.5 %   Platelets 365 150 - 400 K/uL  Glucose, capillary     Status: Abnormal   Collection Time: 08/16/15  4:30 PM  Result Value Ref Range   Glucose-Capillary 267 (H) 65 - 99 mg/dL  Glucose, capillary     Status: Abnormal   Collection Time: 08/16/15 10:01 PM  Result Value Ref Range   Glucose-Capillary 164 (H) 65 - 99 mg/dL  Culture, blood (single)     Status: None (Preliminary result)   Collection Time: 08/16/15 10:55 PM  Result Value Ref Range   Specimen Description BLOOD RIGHT ANTECUBITAL    Special Requests BOTTLES DRAWN AEROBIC AND ANAEROBIC 5CC     Culture NO GROWTH < 24 HOURS    Report  Status PENDING   Glucose, capillary     Status: Abnormal   Collection Time: 08/17/15  1:07 AM  Result Value Ref Range   Glucose-Capillary 207 (H) 65 - 99 mg/dL  CBC     Status: Abnormal   Collection Time: 08/17/15  3:50 AM  Result Value Ref Range   WBC 10.1 4.0 - 10.5 K/uL   RBC 2.67 (L) 4.22 - 5.81 MIL/uL   Hemoglobin 7.3 (L) 13.0 - 17.0 g/dL   HCT 23.4 (L) 39.0 - 52.0 %   MCV 87.6 78.0 - 100.0 fL   MCH 27.3 26.0 - 34.0 pg   MCHC 31.2 30.0 - 36.0 g/dL   RDW 14.4 11.5 - 15.5 %   Platelets 346 150 - 400 K/uL  Basic metabolic panel     Status: Abnormal   Collection Time: 08/17/15  3:50 AM  Result Value Ref Range   Sodium 132 (L) 135 - 145 mmol/L   Potassium 4.6 3.5 - 5.1 mmol/L    Comment: DELTA CHECK NOTED NO VISIBLE HEMOLYSIS    Chloride 96 (L) 101 - 111 mmol/L   CO2 28 22 - 32 mmol/L   Glucose, Bld 251 (H) 65 - 99 mg/dL   BUN 11 6 - 20 mg/dL   Creatinine, Ser 1.18 0.61 - 1.24 mg/dL   Calcium 9.1 8.9 - 10.3 mg/dL   GFR calc non Af Amer >60 >60 mL/min   GFR calc Af Amer >60 >60 mL/min    Comment: (NOTE) The eGFR has been calculated using the CKD EPI equation. This calculation has not been validated in all clinical situations. eGFR's persistently <60 mL/min signify possible Chronic Kidney Disease.    Anion gap 8 5 - 15  Glucose, capillary     Status: Abnormal   Collection Time: 08/17/15  6:26 AM  Result Value Ref Range   Glucose-Capillary 197 (H) 65 - 99 mg/dL  Prepare RBC     Status: None   Collection Time: 08/17/15 10:00 AM  Result Value Ref Range   Order Confirmation ORDER PROCESSED BY BLOOD BANK   Type and screen [place]     Status: None   Collection Time: 08/17/15 10:00 AM  Result Value Ref Range   ABO/RH(D) O POS    Antibody Screen NEG    Sample Expiration 08/20/2015    Unit Number [id number]    Blood Component Type RBC LR PHER2  Unit division 00    Status of Unit ISSUED,FINAL    Transfusion Status OK TO TRANSFUSE    Crossmatch  Result Compatible   Glucose, capillary     Status: Abnormal   Collection Time: 08/17/15 11:40 AM  Result Value Ref Range   Glucose-Capillary 283 (H) 65 - 99 mg/dL  Glucose, capillary     Status: Abnormal   Collection Time: 08/17/15  5:11 PM  Result Value Ref Range   Glucose-Capillary 235 (H) 65 - 99 mg/dL  Glucose, capillary     Status: Abnormal   Collection Time: 08/17/15  9:49 PM  Result Value Ref Range   Glucose-Capillary 147 (H) 65 - 99 mg/dL   Comment 1 Notify RN   CBC     Status: Abnormal   Collection Time: 08/18/15  6:28 AM  Result Value Ref Range   WBC 12.3 (H) 4.0 - 10.5 K/uL   RBC 3.21 (L) 4.22 - 5.81 MIL/uL   Hemoglobin 8.8 (L) 13.0 - 17.0 g/dL   HCT 27.7 (L) 39.0 - 52.0 %   MCV 86.3 78.0 - 100.0 fL   MCH 27.4 26.0 - 34.0 pg   MCHC 31.8 30.0 - 36.0 g/dL   RDW 14.3 11.5 - 15.5 %   Platelets 355 150 - 400 K/uL  Basic metabolic panel     Status: Abnormal   Collection Time: 08/18/15  6:28 AM  Result Value Ref Range   Sodium 132 (L) 135 - 145 mmol/L   Potassium 3.9 3.5 - 5.1 mmol/L   Chloride 96 (L) 101 - 111 mmol/L   CO2 29 22 - 32 mmol/L   Glucose, Bld 181 (H) 65 - 99 mg/dL   BUN 12 6 - 20 mg/dL   Creatinine, Ser 1.08 0.61 - 1.24 mg/dL   Calcium 9.4 8.9 - 10.3 mg/dL   GFR calc non Af Amer >60 >60 mL/min   GFR calc Af Amer >60 >60 mL/min    Comment: (NOTE) The eGFR has been calculated using the CKD EPI equation. This calculation has not been validated in all clinical situations. eGFR's persistently <60 mL/min signify possible Chronic Kidney Disease.    Anion gap 7 5 - 15  Glucose, capillary     Status: Abnormal   Collection Time: 08/18/15  6:52 AM  Result Value Ref Range   Glucose-Capillary 200 (H) 65 - 99 mg/dL  Glucose, capillary     Status: Abnormal   Collection Time: 08/18/15  1:12 PM  Result Value Ref Range   Glucose-Capillary 210 (H) 65 - 99 mg/dL   No results found.   Medical Problem List and Plan: 1.  Decreased functional mobility secondary to  left BKA 08/16/2015 after recent left popliteal angioplasty. 2.  DVT Prophylaxis/Anticoagulation: Subcutaneous heparin. Monitor for any bleeding episodes 3. Pain Management: Neurontin 300 mg twice a day, Robaxin and oxycodone as needed 4. Acute blood loss anemia. Follow-up CBC 5. Neuropsych: This patient is capable of making decisions on his own behalf. 6. Skin/Wound Care: Routine skin checks 7. Fluids/Electrolytes/Nutrition: Routine I&O's will follow-up chemistries 8. Diabetes mellitus with peripheral neuropathy.Hemoglobin A1c 7.0 area Lantus insulin 12 units daily, NovoLog 3 units 3 times a day. Check blood sugars before meals and at bedtime and provide diabetic teaching 9. Hypertension. Coreg 3.125 mg twice a day, Lisinopril 20 mg twice a day, HCTZ 12.5 mg twice a day. Monitor decreased mobility 10. Hyperlipidemia. Zocor 11. History CVA 20/15 without residual. Continue low-dose aspirin 12. BPH. Flomax 0.4 mg daily. Check PVRs  3 13. History of tobacco/alcohol abuse. Monitor for any signs of withdrawal. Provide counseling 14. Chronic renal insufficiency. Baseline creatinine 1.34. Follow-up chemistries 15. Hyponetremia. Follow-up chemistries 16. PVD: Cont meds 17. Hepatitis C: Avoid hepatotoxic meds. 18. SIRS: cont abx, cont to monitor  Post Admission Physician Evaluation: 1. Functional deficits secondary  to left BKA 08/16/2015 after recent left popliteal angioplasty. 2. Patient is admitted to receive collaborative, interdisciplinary care between the physiatrist, rehab nursing staff, and therapy team. 3. Patient's level of medical complexity and substantial therapy needs in context of that medical necessity cannot be provided at a lesser intensity of care such as a SNF. 4. Patient has experienced substantial functional loss from his/her baseline which was documented above under the "Functional History" and "Functional Status" headings.  Judging by the patient's diagnosis, physical exam, and  functional history, the patient has potential for functional progress which will result in measurable gains while on inpatient rehab.  These gains will be of substantial and practical use upon discharge  in facilitating mobility and self-care at the household level. 5. Physiatrist will provide 24 hour management of medical needs as well as oversight of the therapy plan/treatment and provide guidance as appropriate regarding the interaction of the two. 6. 24 hour rehab nursing will assist with safety, skin/wound care, disease management, pain management and patient education and help integrate therapy concepts, techniques,education, etc. 7. PT will assess and treat for/with: Lower extremity strength, range of motion, stamina, balance, functional mobility, safety, adaptive techniques and equipment, woundcare, coping skills, pain control, pre-prosthetic education.   Goals are: Mod I at wheelchair leve. 8. OT will assess and treat for/with: ADL's, functional mobility, safety, upper extremity strength, adaptive techniques and equipment, wound mgt, ego support, and community reintegration.   Goals are: Mod I at wheelchair level. Therapy may not proceed with showering this patient. 9. Case Management and Social Worker will assess and treat for psychological issues and discharge planning. 10. Team conference will be held weekly to assess progress toward goals and to determine barriers to discharge. 11. Patient will receive at least 3 hours of therapy per day at least 5 days per week. 12. ELOS: 8-12 days. 13. Prognosis:  good  Delice Lesch, MD 08/18/2015

## 2015-08-18 NOTE — Progress Notes (Signed)
PROGRESS NOTE                                                                                                                                                                                                             Patient Demographics:    Cory Weber, is a 65 y.o. male, DOB - 1950/12/26, [id number]  Admit date - 08/14/2015   Admitting Physician Toy Baker, MD  Outpatient Primary MD for the patient is Maggie Font, MD  LOS - 3  Outpatient Specialists: Dr. Trula Slade   Chief Complaint  Patient presents with  . Wound Infection       Brief Narrative   65 yo M with history of HTN, PVD, DM2, CKD III, recently hospitalized for LLE cellulitis / wounds, d/c on 6/29 (of note, he refused SNF), admitted again with LLE wound / wet gangrene worsening and now with maggots, Status post left BKA by Dr. Trula Slade on 08/16/2015.  Awaiting evaluation for CIR.    Subjective:    Cory Weber without complaints, wants to get to moving and motivated about rehab   Assessment  & Plan :    Active Problems:   Accelerated hypertension   Peripheral vascular disease, unspecified ([place])   Diabetes mellitus with foot ulcer, without long-term current use of insulin (HCC)   Chronic kidney disease, stage 3   Essential hypertension   Diabetic wet gangrene of the foot ([place])   Wet Gangrene /cellulitis  - he was recently evaluated by vascular surgery and Dr. Trula Slade did balloon angioplasty, left popliteal artery, balloon angioplasty, left tibioperoneal trunk and failed balloon angioplasty, left posterior tibial artery - Finish IV antibiotics today.  - Vascular Surgery appreciated, status post left BKA by Dr. Trula Slade on 7/5, post-op care per Dr.Brabham.  Diabetes mellitus with foot ulcer, without long-term current use of insulin ([place]) - most recent A1C 7.0 on 07/24/15 - Continue to hold oral hypoglycemic agent, continue with insulin sliding scale, lantus and adding  prandial novolog  Essential hypertension  - continue home medications  Peripheral vascular disease  - vascular surgery consulted  Chronic kidney disease, stage 3  - currently creatinine better than baseline will avoid nephrotoxic medications  Anemia  - patient have recently required transfusions due to iron deficiency anemia will avoid NSAID use. - Hemoglobin improved after transfused 1 unit PRBC 7/6.  Fever -much improved, blood cultures were sent: NGTD, encouraged to use incentive spirometry.    Hyperlipidemia - Resume on Zocor  History of CVA - Will resume on aspirin  Code Status : Full  Family Communication  : udated at bedside  Disposition Plan  :  Possible CIR  Consults  :  Orthopedic  Procedures  : Left BKA by Dr. Trula Slade 7/5  DVT Prophylaxis  :   Heparin  Lab Results  Component Value Date   PLT 355 08/18/2015    Antibiotics  :    Anti-infectives    Start     Dose/Rate Route Frequency Ordered Stop   08/15/15 1200  vancomycin (VANCOCIN) IVPB 1000 mg/200 mL premix     1,000 mg 200 mL/hr over 60 Minutes Intravenous Every 12 hours 08/15/15 0306     08/15/15 0800  piperacillin-tazobactam (ZOSYN) IVPB 3.375 g     3.375 g 12.5 mL/hr over 240 Minutes Intravenous Every 8 hours 08/15/15 0306     08/15/15 0030  vancomycin (VANCOCIN) IVPB 1000 mg/200 mL premix     1,000 mg 200 mL/hr over 60 Minutes Intravenous  Once 08/15/15 0025 08/15/15 0212   08/15/15 0030  piperacillin-tazobactam (ZOSYN) IVPB 3.375 g     3.375 g 100 mL/hr over 30 Minutes Intravenous  Once 08/15/15 0025 08/15/15 0243        Objective:   Filed Vitals:   08/17/15 1230 08/17/15 1515 08/17/15 2027 08/18/15 0653  BP:  149/82 128/85 150/89  Pulse: 115 101 102 51  Temp: 99.7 F (37.6 C) 98 F (36.7 C) 100.4 F (38 C) 99.8 F (37.7 C)  TempSrc: Oral Oral Oral Oral  Resp: 16 16 16 16   Height:      Weight:      SpO2: 97% 100% 98% 99%    Wt Readings from Last 3 Encounters:  08/15/15  199 lb 1.2 oz (90.3 kg)  07/23/15 199 lb (90.266 kg)  07/18/15 199 lb 14.4 oz (90.674 kg)     Intake/Output Summary (Last 24 hours) at 08/18/15 0812 Last data filed at 08/18/15 0654  Gross per 24 hour  Intake    720 ml  Output   2550 ml  Net  -1830 ml     Physical Exam  Awake Alert, Oriented X 3, No new F.N deficits, Normal affect [place].AT,PERRAL Supple Neck,No JVD, No cervical lymphadenopathy appriciated.  Symmetrical Chest wall movement, Good air movement bilaterally, CTAB RRR,No Gallops,Rubs or new Murmurs, No Parasternal Heave +ve B.Sounds, Abd Soft, No tenderness, No organomegaly appriciated, No rebound - guarding or rigidity. No Cyanosis, Clubbing or edema,Left BKA,  with Ace wrap, no evidence of bleed or drainage    Data Review:    CBC  Recent Labs Lab 08/14/15 2019 08/15/15 0615 08/16/15 1614 08/17/15 0350 08/18/15 0628  WBC 11.3* 9.4 11.0* 10.1 12.3*  HGB 8.0* 7.5* 8.0* 7.3* 8.8*  HCT 26.6* 25.0* 26.0* 23.4* 27.7*  PLT 387 357 365 346 355  MCV 89.3 88.3 88.4 87.6 86.3  MCH 26.8 26.5 27.2 27.3 27.4  MCHC 30.1 30.0 30.8 31.2 31.8  RDW 14.7 14.6 14.4 14.4 14.3  LYMPHSABS 2.1  --   --   --   --   MONOABS 0.9  --   --   --   --   EOSABS 0.2  --   --   --   --   BASOSABS 0.0  --   --   --   --  Chemistries   Recent Labs Lab 08/14/15 2019 08/15/15 0615 08/17/15 0350 08/18/15 0628  NA 135 136 132* 132*  K 3.8 3.5 4.6 3.9  CL 101 102 96* 96*  CO2 25 27 28 29   GLUCOSE 247* 125* 251* 181*  BUN 12 11 11 12   CREATININE 1.17 1.10 1.18 1.08  CALCIUM 9.2 9.1 9.1 9.4  MG  --  1.6*  --   --   AST 17 13*  --   --   ALT 19 17  --   --   ALKPHOS 71 51  --   --   BILITOT 0.2* 0.6  --   --    ------------------------------------------------------------------------------------------------------------------ No results for input(s): CHOL, HDL, LDLCALC, TRIG, CHOLHDL, LDLDIRECT in the last 72 hours.  Lab Results  Component Value Date   HGBA1C 7.0*  07/24/2015   ------------------------------------------------------------------------------------------------------------------ No results for input(s): TSH, T4TOTAL, T3FREE, THYROIDAB in the last 72 hours.  Invalid input(s): FREET3 ------------------------------------------------------------------------------------------------------------------ No results for input(s): VITAMINB12, FOLATE, FERRITIN, TIBC, IRON, RETICCTPCT in the last 72 hours.  Coagulation profile No results for input(s): INR, PROTIME in the last 168 hours.  No results for input(s): DDIMER in the last 72 hours.  Cardiac Enzymes No results for input(s): CKMB, TROPONINI, MYOGLOBIN in the last 168 hours.  Invalid input(s): CK ------------------------------------------------------------------------------------------------------------------ No results found for: BNP  Inpatient Medications  Scheduled Meds: . acidophilus  2 capsule Oral Daily  . aspirin EC  81 mg Oral Daily  . carvedilol  3.125 mg Oral BID WC  . famotidine  20 mg Oral Daily  . feeding supplement (GLUCERNA SHAKE)  237 mL Oral TID BM  . feeding supplement (PRO-STAT SUGAR FREE 64)  30 mL Oral Q1500  . gabapentin  300 mg Oral BID  . heparin subcutaneous  5,000 Units Subcutaneous Q8H  . lisinopril  20 mg Oral BID   And  . hydrochlorothiazide  12.5 mg Oral BID  . insulin aspart  0-9 Units Subcutaneous TID WC  . insulin aspart  4 Units Subcutaneous TID WC  . insulin glargine  12 Units Subcutaneous Daily  . piperacillin-tazobactam (ZOSYN)  IV  3.375 g Intravenous Q8H  . simvastatin  20 mg Oral q1800  . tamsulosin  0.4 mg Oral Daily  . thiamine  100 mg Oral Daily  . vancomycin  1,000 mg Intravenous Q12H   Continuous Infusions: . sodium chloride 100 mL/hr at 08/18/15 0003   PRN Meds:.acetaminophen **OR** acetaminophen, morphine injection, ondansetron **OR** ondansetron (ZOFRAN) IV, oxyCODONE-acetaminophen  Micro Results Recent Results (from the past  240 hour(s))  Blood culture (routine x 2)     Status: None (Preliminary result)   Collection Time: 08/15/15  1:02 AM  Result Value Ref Range Status   Specimen Description BLOOD RIGHT ARM  Final   Special Requests BOTTLES DRAWN AEROBIC AND ANAEROBIC 5ML  Final   Culture NO GROWTH 2 DAYS  Final   Report Status PENDING  Incomplete  Blood culture (routine x 2)     Status: None (Preliminary result)   Collection Time: 08/15/15  1:07 AM  Result Value Ref Range Status   Specimen Description BLOOD RIGHT HAND  Final   Special Requests BOTTLES DRAWN AEROBIC AND ANAEROBIC 5ML  Final   Culture NO GROWTH 2 DAYS  Final   Report Status PENDING  Incomplete  Surgical pcr screen     Status: None   Collection Time: 08/15/15  4:56 AM  Result Value Ref Range Status   MRSA, PCR NEGATIVE NEGATIVE  Final   Staphylococcus aureus NEGATIVE NEGATIVE Final    Comment:        The Xpert SA Assay (FDA approved for NASAL specimens in patients over 31 years of age), is one component of a comprehensive surveillance program.  Test performance has been validated by [facility] for patients greater than or equal to 32 year old. It is not intended to diagnose infection nor to guide or monitor treatment.   Culture, blood (single)     Status: None (Preliminary result)   Collection Time: 08/16/15 10:55 PM  Result Value Ref Range Status   Specimen Description BLOOD RIGHT ANTECUBITAL  Final   Special Requests BOTTLES DRAWN AEROBIC AND ANAEROBIC 5CC   Final   Culture NO GROWTH < 24 HOURS  Final   Report Status PENDING  Incomplete    Radiology Reports US Renal  07/27/2015  CLINICAL DATA:  Acute kidney injury EXAM: RENAL / URINARY TRACT ULTRASOUND COMPLETE COMPARISON:  None. FINDINGS: Right Kidney: Length: 12.6 cm. Mildly increased echogenicity. No hydronephrosis or mass. Left Kidney: Length: 12.6 cm. Mildly increased echogenicity. No hydronephrosis or mass. Bladder: Not evaluated.  Decompressed by Foley catheter.  IMPRESSION: Evidence of medical renal disease Electronically Signed   By: Skipper Cliche M.D.   On: 07/27/2015 11:21   US Arterial Seg Multiple  07/19/2015  CLINICAL DATA:  65 year old male with left foot cellulitis and pain. EXAM: NONINVASIVE PHYSIOLOGIC VASCULAR STUDY OF BILATERAL LOWER EXTREMITIES TECHNIQUE: Evaluation of both lower extremities was performed at rest, including calculation of ankle-brachial indices, multiple segmental pressure evaluation, segmental Doppler and segmental pulse volume recording. COMPARISON:  None. FINDINGS: Right ABI:  0.91 Left ABI:  0.45 Right Lower Extremity: Significant pressure differential between the low thigh and calf cuffs suggesting distal femoral popliteal disease. Preserved digital waveforms on PVRs. Left Lower Extremity: Significant pressure gradient between the low thigh cuff and calf cuff consistent with distal femoral popliteal disease. Additionally, there are significant pressure gradients between the calf and ankle consistent with runoff disease. IMPRESSION: 1. Resting right ankle brachial index of 0.91 consistent with at least mild peripheral arterial disease. Segmental pressures, arterial waveforms and PVRs suggest femoral popliteal disease. 2. Resting left ankle brachial index of 0.45 consistent with advanced peripheral arterial disease. Segmental pressures, arterial waveforms and PVRs suggest both femoral popliteal disease and runoff disease. Given clinical history of left foot wound and rest pain these findings are concerning for critical limb ischemia (CLI). Recommend referral to vascular Interventional Radiology or other endovascular specialist for further evaluation and management. Signed, Criselda Peaches, MD [facility] Electronically Signed   By: Jacqulynn Cadet M.D.   On: 07/19/2015 16:22   Dg Foot 2 Views Left  07/24/2015  CLINICAL DATA:  Cellulitis with wound on first toe EXAM: LEFT  FOOT - 2 VIEW COMPARISON:  July 18, 2015 FINDINGS: Frontal and lateral views were obtained. There is no demonstrable fracture or dislocation. The joint spaces appear normal. No erosive change or bony destruction. There is soft tissue swelling over the dorsum of the foot. There is no soft tissue air or radiopaque foreign body. IMPRESSION: No bony destruction or erosion. No fracture or dislocation. Soft tissue swelling over the dorsal foot. No soft tissue abscess or radiopaque foreign body evident. Electronically Signed   By: Lowella Grip III M.D.   On: 07/24/2015 07:23     Irwin Brakeman M.D on 08/18/2015 at 8:12 AM  Between 7am to 7pm - Pager - [phone]  After 7pm go to [email or web address] - password [facility]  [facility] -  Office  [phone]

## 2015-08-19 LAB — GLUCOSE, CAPILLARY
GLUCOSE-CAPILLARY: 215 mg/dL — AB (ref 65–99)
GLUCOSE-CAPILLARY: 135 mg/dL — AB (ref 65–99)
Glucose-Capillary: 176 mg/dL — ABNORMAL HIGH (ref 65–99)
Glucose-Capillary: 235 mg/dL — ABNORMAL HIGH (ref 65–99)

## 2015-08-19 NOTE — Clinical Social Work Note (Signed)
Clinical Social Work Assessment  Patient Details  Name: Cory Weber MRN: [id number] Date of Birth: Jan 10, 1951  Date of referral:  08/19/15               Reason for consult:  Discharge Planning                Permission sought to share information with:  Case Manager, Facility Sport and exercise psychologist, Family Supports Permission granted to share information::  No  Name::        Agency::   (SNF)  Relationship::     Contact Information:     Housing/Transportation Living arrangements for the past 2 months:  Apartment Source of Information:  Patient, Medical Team Patient Interpreter Needed:  None Criminal Activity/Legal Involvement Pertinent to Current Situation/Hospitalization:  No - Comment as needed Significant Relationships:  None Lives with:  Self Do you feel safe going back to the place where you live?  No Need for family participation in patient care:  Yes (Comment)  Care giving concerns: Pt expressed concerns about discharging home from CIR and wanted to know if home health would be available after he leaves hospital  Inpatient short-term rehab.    Social Worker assessment / plan:Clinical Education officer, museum met with pt to discuss CSW role with discharge planning. CSW explained PT recommendation for CIR. Pt agreeable to CIR for short-term rehab. Pt requested home health if needed once released from CIR to assist him at home. Pt indicated that he lives alone and is concerned he may need more assistance.   CSW will assist pt with discharge planning as needed.   Employment status:  Disabled (Comment on whether or not currently receiving Disability) Insurance information:  Medicaid [place] PT Recommendations:  [place] / Referral to community resources:  [place]  Patient/Family's Response to care: Pt pleasant and lying in bed. Pt understands CIR recommendation and pt appears happy with care he is receiving at [facility].    Patient/Family's Understanding of and Emotional Response to Diagnosis, Current Treatment, and Prognosis: Pt appears to have a good understanding of reason for admission into the hospital and pt care plan.  Emotional Assessment Appearance:  Appears stated age Attitude/Demeanor/Rapport:   (Pleasant) Affect (typically observed):  Accepting, Appropriate, Pleasant, Hopeful Orientation:  Oriented to Self, Oriented to Place, Oriented to  Time, Oriented to Situation Alcohol / Substance use:  Not Applicable Psych involvement (Current and /or in the community):  No (Comment)  Discharge Needs  Concerns to be addressed:  Discharge Planning Concerns Readmission within the last 30 days:  No Current discharge risk:  Lives alone Barriers to Discharge:  Continued Medical Work up   [employer], LCSW 08/19/2015, 4:21 PM

## 2015-08-19 NOTE — Progress Notes (Signed)
PROGRESS NOTE                                                                                                                                                                                                             Patient Demographics:    Cory Weber, is a 65 y.o. male, DOB - 1950-04-17, [id number]  Admit date - 08/14/2015   Admitting Physician Toy Baker, MD  Outpatient Primary MD for the patient is Maggie Font, MD  LOS - 4  Outpatient Specialists: Dr. Trula Slade   Chief Complaint  Patient presents with  . Wound Infection       Brief Narrative   Cory Weber with history of HTN, PVD, DM2, CKD III, recently hospitalized for LLE cellulitis / wounds, d/c on 6/29 (of note, he refused SNF), admitted again with LLE wound / wet gangrene worsening and now with maggots, Status post left BKA by Dr. Trula Slade on 08/16/2015.  Awaiting CIR admission.    Subjective:    Cory Weber without complaints, motivated about rehab   Assessment  & Plan :    Active Problems:   Accelerated hypertension   Peripheral vascular disease, unspecified ([place])   Diabetes mellitus with foot ulcer, without long-term current use of insulin (HCC)   Chronic kidney disease, stage 3   Essential hypertension   Diabetic wet gangrene of the foot (HCC)   Chronic hepatitis C without hepatic coma (HCC)   History of CVA (cerebrovascular accident) without residual deficits   Leukocytosis   SIRS (systemic inflammatory response syndrome) (HCC)   Tachycardia   Hyponatremia   Wet Gangrene /cellulitis  - he was recently evaluated by vascular surgery and Dr. Trula Slade did balloon angioplasty, left popliteal artery, balloon angioplasty, left tibioperoneal trunk and failed balloon angioplasty, left posterior tibial artery - Finish IV antibiotics 7/7.  Oral antibiotics started 7/8. - Vascular Surgery appreciated, status post left BKA by Dr. Trula Slade on 7/5, post-op care per  Dr.Brabham.  Diabetes mellitus with foot ulcer, without long-term current use of insulin ([place]) - most recent A1C 7.0 on 07/24/15 - Continue to hold oral hypoglycemic agent, continue with insulin sliding scale, lantus and adding prandial novolog - expect blood sugars to improve when he's in rehab and a lot more physically active  CBG (last 3)   Recent Labs  08/18/15 1749 08/18/15 2104  08/19/15 0634  GLUCAP 153* 158* 215*   Essential hypertension  - continue home medications, currently stable and controlled  Peripheral vascular disease  - vascular surgery consulted  Chronic kidney disease, stage 3  - currently creatinine better than baseline will avoid nephrotoxic medications  Anemia  - patient have recently required transfusions due to iron deficiency anemia will avoid NSAID use. - Hemoglobin improved after transfused 1 unit PRBC 7/6.  Fever -much improved, blood cultures were sent: NGTD, encouraged to use incentive spirometry.    Hyperlipidemia - Resume on Zocor  History of CVA - Will resume on aspirin  Code Status : Full  Family Communication  : udated at bedside  Disposition Plan  :  CIR   Consults  :  Orthopedic  Procedures  : Left BKA by Dr. Trula Slade 7/5  DVT Prophylaxis  :   Heparin  Lab Results  Component Value Date   PLT 355 08/18/2015    Antibiotics  :    Anti-infectives    Start     Dose/Rate Route Frequency Ordered Stop   08/19/15 1000  doxycycline (VIBRA-TABS) tablet 100 mg     100 mg Oral Every 12 hours 08/18/15 0825     08/15/15 1200  vancomycin (VANCOCIN) IVPB 1000 mg/200 mL premix     1,000 mg 200 mL/hr over 60 Minutes Intravenous Every 12 hours 08/15/15 0306 08/18/15 1254   08/15/15 0800  piperacillin-tazobactam (ZOSYN) IVPB 3.375 g     3.375 g 12.5 mL/hr over 240 Minutes Intravenous Every 8 hours 08/15/15 0306 08/18/15 2359   08/15/15 0030  vancomycin (VANCOCIN) IVPB 1000 mg/200 mL premix     1,000 mg 200 mL/hr over 60 Minutes  Intravenous  Once 08/15/15 0025 08/15/15 0212   08/15/15 0030  piperacillin-tazobactam (ZOSYN) IVPB 3.375 g     3.375 g 100 mL/hr over 30 Minutes Intravenous  Once 08/15/15 0025 08/15/15 0243        Objective:   Filed Vitals:   08/18/15 0653 08/18/15 1500 08/18/15 2058 08/19/15 0500  BP: 150/89 96/64 120/72 113/79  Pulse: 51 98 97 100  Temp: 99.8 F (37.7 C) 98.7 F (37.1 C) 100 F (37.8 C) 99 F (37.2 C)  TempSrc: Oral Oral    Resp: 16  18 17   Height:      Weight:      SpO2: 99% 98% 98% 100%    Wt Readings from Last 3 Encounters:  08/15/15 199 lb 1.2 oz (90.3 kg)  07/23/15 199 lb (90.266 kg)  07/18/15 199 lb 14.4 oz (90.674 kg)     Intake/Output Summary (Last 24 hours) at 08/19/15 0913 Last data filed at 08/19/15 0000  Gross per 24 hour  Intake    480 ml  Output    700 ml  Net   -220 ml   Physical Exam  Awake Alert, Oriented X 3, No new F.N deficits, Normal affect [place].AT,PERRAL Supple Neck,No JVD, No cervical lymphadenopathy appriciated.  Symmetrical Chest wall movement, Good air movement bilaterally, CTAB RRR,No Gallops,Rubs or new Murmurs, No Parasternal Heave +ve B.Sounds, Abd Soft, No tenderness, No organomegaly appriciated, No rebound - guarding or rigidity. No Cyanosis, Clubbing or edema,Left BKA,  with Ace wrap, no evidence of bleed or drainage    Data Review:    CBC  Recent Labs Lab 08/14/15 2019 08/15/15 0615 08/16/15 1614 08/17/15 0350 08/18/15 0628  WBC 11.3* 9.4 11.0* 10.1 12.3*  HGB 8.0* 7.5* 8.0* 7.3* 8.8*  HCT 26.6* 25.0* 26.0* 23.4* 27.7*  PLT 387 357 365 346 355  MCV 89.3 88.3 88.4 87.6 86.3  MCH 26.8 26.5 27.2 27.3 27.4  MCHC 30.1 30.0 30.8 31.2 31.8  RDW 14.7 14.6 14.4 14.4 14.3  LYMPHSABS 2.1  --   --   --   --   MONOABS 0.9  --   --   --   --   EOSABS 0.2  --   --   --   --   BASOSABS 0.0  --   --   --   --     Chemistries   Recent Labs Lab 08/14/15 2019 08/15/15 0615 08/17/15 0350 08/18/15 0628  NA 135 136 132*  132*  K 3.8 3.5 4.6 3.9  CL 101 102 96* 96*  CO2 25 27 28 29   GLUCOSE 247* 125* 251* 181*  BUN 12 11 11 12   CREATININE 1.17 1.10 1.18 1.08  CALCIUM 9.2 9.1 9.1 9.4  MG  --  1.6*  --   --   AST 17 13*  --   --   ALT 19 17  --   --   ALKPHOS 71 51  --   --   BILITOT 0.2* 0.6  --   --    ------------------------------------------------------------------------------------------------------------------ No results for input(s): CHOL, HDL, LDLCALC, TRIG, CHOLHDL, LDLDIRECT in the last 72 hours.  Lab Results  Component Value Date   HGBA1C 7.0* 07/24/2015   ------------------------------------------------------------------------------------------------------------------ No results for input(s): TSH, T4TOTAL, T3FREE, THYROIDAB in the last 72 hours.  Invalid input(s): FREET3 ------------------------------------------------------------------------------------------------------------------ No results for input(s): VITAMINB12, FOLATE, FERRITIN, TIBC, IRON, RETICCTPCT in the last 72 hours.  Coagulation profile No results for input(s): INR, PROTIME in the last 168 hours.  No results for input(s): DDIMER in the last 72 hours.  Cardiac Enzymes No results for input(s): CKMB, TROPONINI, MYOGLOBIN in the last 168 hours.  Invalid input(s): CK ------------------------------------------------------------------------------------------------------------------ No results found for: BNP  Inpatient Medications  Scheduled Meds: . acidophilus  2 capsule Oral Daily  . aspirin EC  81 mg Oral Daily  . carvedilol  3.125 mg Oral BID WC  . doxycycline  100 mg Oral Q12H  . famotidine  Cory mg Oral Daily  . feeding supplement (GLUCERNA SHAKE)  237 mL Oral TID BM  . feeding supplement (PRO-STAT SUGAR FREE 64)  30 mL Oral Q1500  . gabapentin  300 mg Oral BID  . heparin subcutaneous  5,000 Units Subcutaneous Q8H  . lisinopril  Cory mg Oral BID   And  . hydrochlorothiazide  12.5 mg Oral BID  . insulin aspart   0-9 Units Subcutaneous TID WC  . insulin aspart  3 Units Subcutaneous TID WC  . insulin glargine  12 Units Subcutaneous Daily  . simvastatin  Cory mg Oral q1800  . tamsulosin  0.4 mg Oral Daily  . thiamine  100 mg Oral Daily   Continuous Infusions:   PRN Meds:.acetaminophen **OR** acetaminophen, morphine injection, ondansetron **OR** ondansetron (ZOFRAN) IV, oxyCODONE-acetaminophen  Micro Results Recent Results (from the past 240 hour(s))  Blood culture (routine x 2)     Status: None (Preliminary result)   Collection Time: 08/15/15  1:02 AM  Result Value Ref Range Status   Specimen Description BLOOD RIGHT ARM  Final   Special Requests BOTTLES DRAWN AEROBIC AND ANAEROBIC 5ML  Final   Culture NO GROWTH 4 DAYS  Final   Report Status PENDING  Incomplete  Blood culture (routine x 2)     Status: None (Preliminary result)   Collection  Time: 08/15/15  1:07 AM  Result Value Ref Range Status   Specimen Description BLOOD RIGHT HAND  Final   Special Requests BOTTLES DRAWN AEROBIC AND ANAEROBIC 5ML  Final   Culture NO GROWTH 4 DAYS  Final   Report Status PENDING  Incomplete  Surgical pcr screen     Status: None   Collection Time: 08/15/15  4:56 AM  Result Value Ref Range Status   MRSA, PCR NEGATIVE NEGATIVE Final   Staphylococcus aureus NEGATIVE NEGATIVE Final    Comment:        The Xpert SA Assay (FDA approved for NASAL specimens in patients over 21 years of age), is one component of a comprehensive surveillance program.  Test performance has been validated by [facility] for patients greater than or equal to 55 year old. It is not intended to diagnose infection nor to guide or monitor treatment.   Culture, blood (single)     Status: None (Preliminary result)   Collection Time: 08/16/15 10:55 PM  Result Value Ref Range Status   Specimen Description BLOOD RIGHT ANTECUBITAL  Final   Special Requests BOTTLES DRAWN AEROBIC AND ANAEROBIC 5CC   Final   Culture NO GROWTH 3 DAYS  Final     Report Status PENDING  Incomplete    Radiology Reports US Renal  07/27/2015  CLINICAL DATA:  Acute kidney injury EXAM: RENAL / URINARY TRACT ULTRASOUND COMPLETE COMPARISON:  None. FINDINGS: Right Kidney: Length: 12.6 cm. Mildly increased echogenicity. No hydronephrosis or mass. Left Kidney: Length: 12.6 cm. Mildly increased echogenicity. No hydronephrosis or mass. Bladder: Not evaluated.  Decompressed by Foley catheter. IMPRESSION: Evidence of medical renal disease Electronically Signed   By: Skipper Cliche Weber.D.   On: 07/27/2015 11:21   Dg Foot 2 Views Left  07/24/2015  CLINICAL DATA:  Cellulitis with wound on first toe EXAM: LEFT FOOT - 2 VIEW COMPARISON:  July 18, 2015 FINDINGS: Frontal and lateral views were obtained. There is no demonstrable fracture or dislocation. The joint spaces appear normal. No erosive change or bony destruction. There is soft tissue swelling over the dorsum of the foot. There is no soft tissue air or radiopaque foreign body. IMPRESSION: No bony destruction or erosion. No fracture or dislocation. Soft tissue swelling over the dorsal foot. No soft tissue abscess or radiopaque foreign body evident. Electronically Signed   By: Lowella Grip III Weber.D.   On: 07/24/2015 07:23     Anthony Tamburo Weber.D on 08/19/2015 at 9:13 AM  Between 7am to 7pm - Pager - [phone]  After 7pm go to [email or web address] - password [facility]  [facility] -  Office  [phone]

## 2015-08-19 NOTE — Clinical Social Work Placement (Signed)
   CLINICAL SOCIAL WORK PLACEMENT  NOTE  Date:  08/19/2015  Patient Details  Name: Cory Weber MRN: [id number] Date of Birth: 05/26/1950  Clinical Social Work is seeking post-discharge placement for this patient at the [place] level of care (*CSW will initial, date and re-position this form in  chart as items are completed):  Yes   Patient/family provided with [facility] Work Department's list of facilities offering this level of care within the geographic area requested by the patient (or if unable, by the patient's family).  Yes   Patient/family informed of their freedom to choose among providers that offer the needed level of care, that participate in Medicare, Medicaid or managed care program needed by the patient, have an available bed and are willing to accept the patient.  Yes   Patient/family informed of [facility]'s ownership interest in [facility] and [facility], as well as of the fact that they are under no obligation to receive care at these facilities.  PASRR submitted to EDS on       PASRR number received on       Existing PASRR number confirmed on 08/19/15     FL2 transmitted to all facilities in geographic area requested by pt/family on 08/19/15     FL2 transmitted to all facilities within larger geographic area on       Patient informed that his/her managed care company has contracts with or will negotiate with certain facilities, including the following:            Patient/family informed of bed offers received.  Patient chooses bed at       Physician recommends and patient chooses bed at      Patient to be transferred to   on  .  Patient to be transferred to facility by       Patient family notified on   of transfer.  Name of family member notified:        PHYSICIAN Please prepare priority discharge summary, including medications, Please prepare prescriptions, Please sign FL2     Additional Comment:     _______________________________________________ Junie Spencer, LCSW 08/19/2015, 4:45 PM

## 2015-08-19 NOTE — NC FL2 (Signed)
[place] LEVEL OF CARE SCREENING TOOL     IDENTIFICATION  Patient Name: Cory Weber Birthdate: 06/15/1950 Sex: male Admission Date (Current Location): 08/14/2015  [facility] and [place] Number:  Herbalist and Address:  [facility] [place] [address] [place], [place] [address]      Provider Number: [id number]  Attending Physician Name and Address:  Murlean Iba, MD  Relative Name and Phone Number:       Current Level of Care: Hospital Recommended Level of Care: [place] Prior Approval Number:    Date Approved/Denied:   PASRR Number:   [id number]   Discharge Plan: SNF    Current Diagnoses: Patient Active Problem List   Diagnosis Date Noted  . Chronic hepatitis C without hepatic coma ([place])   . History of CVA (cerebrovascular accident) without residual deficits   . Leukocytosis   . SIRS (systemic inflammatory response syndrome) (HCC)   . Tachycardia   . Hyponatremia   . Diabetic wet gangrene of the foot ([place]) 08/15/2015  . AKI (acute kidney injury) ([place])   . Atherosclerosis of left lower extremity with ulceration of midfoot ([place])   . Left foot infection 07/23/2015  . Type 2 diabetes mellitus with vascular disease ([place]) 07/23/2015  . Normocytic normochromic anemia 07/23/2015  . Cellulitis of left foot 07/23/2015  . Cellulitis L foot 07/18/2015  . Diabetes mellitus with foot ulcer, without long-term current use of insulin ([place]) 07/18/2015  . Chronic kidney disease, stage 3 07/18/2015  . Essential hypertension 07/18/2015  . Cellulitis of foot 07/18/2015  . Dyspepsia 04/13/2014  . Acute blood loss anemia 12/01/2013  . Constipation 12/01/2013  . Radiculopathy of lumbar region 11/29/2013  . Acute right lumbar radiculopathy 11/26/2013  . S/P lumbar spinal fusion 11/25/2013  . Lumbar spinal stenosis 11/24/2013  . Right leg weakness 11/23/2013  . PUD (peptic ulcer disease) 11/19/2012  . IDA (iron  deficiency anemia) 07/10/2012  . Chronic hepatitis C ([place]) 05/13/2012  . Unspecified constipation 05/13/2012  . Peripheral vascular disease, unspecified ([place]) 01/21/2012  . Hepatitis C antibody test positive 12/03/2011  . Normocytic anemia 12/03/2011  . Melena 12/03/2011  . DM (diabetes mellitus), type 2, uncontrolled ([place]) 02/03/2011  . Accelerated hypertension 02/03/2011  . Hemiparesthesia 02/03/2011  . Polysubstance abuse 02/03/2011  . Tobacco abuse 02/03/2011    Orientation RESPIRATION BLADDER Height & Weight     Self, Time, Situation, Place  Normal Continent Weight: 199 lb 1.2 oz (90.3 kg) Height:  6\' 1"  (185.4 cm)  BEHAVIORAL SYMPTOMS/MOOD NEUROLOGICAL BOWEL NUTRITION STATUS   (None)  (none) Continent Diet (Carb modified)  AMBULATORY STATUS COMMUNICATION OF NEEDS Skin   Extensive Assist Verbally Surgical wounds                       Personal Care Assistance Level of Assistance  Bathing, Feeding, Dressing Bathing Assistance: Limited assistance Feeding assistance: Independent Dressing Assistance: Limited assistance     Functional Limitations Info  Sight, Hearing, Speech Sight Info: Adequate Hearing Info: Adequate Speech Info: Adequate    SPECIAL CARE FACTORS FREQUENCY  PT (By licensed PT), OT (By licensed OT)     PT Frequency:  (5x/week) OT Frequency:  (5x/week)            Contractures Contractures Info: Not present    Additional Factors Info  Code Status, Allergies, Insulin Sliding Scale Code Status Info:  (full) Allergies Info:  (nkda)   Insulin Sliding  Scale Info:  (0-9 units 3x/day)       Current Medications (08/19/2015):  This is the current hospital active medication list Current Facility-Administered Medications  Medication Dose Route Frequency Provider Last Rate Last Dose  . acetaminophen (TYLENOL) tablet 650 mg  650 mg Oral Q6H PRN Toy Baker, MD   650 mg at 08/16/15 2104   Or  . acetaminophen (TYLENOL) suppository 650 mg  650  mg Rectal Q6H PRN Toy Baker, MD      . acidophilus (RISAQUAD) capsule 2 capsule  2 capsule Oral Daily Albertine Patricia, MD   2 capsule at 08/19/15 0853  . aspirin EC tablet 81 mg  81 mg Oral Daily Albertine Patricia, MD   81 mg at 08/19/15 0852  . carvedilol (COREG) tablet 3.125 mg  3.125 mg Oral BID WC Toy Baker, MD   3.125 mg at 08/19/15 0851  . doxycycline (VIBRA-TABS) tablet 100 mg  100 mg Oral Q12H [place] Marisa Hua, MD   100 mg at 08/19/15 0852  . famotidine (PEPCID) tablet 20 mg  20 mg Oral Daily Toy Baker, MD   20 mg at 08/19/15 0852  . feeding supplement (GLUCERNA SHAKE) (GLUCERNA SHAKE) liquid 237 mL  237 mL Oral TID BM Albertine Patricia, MD   237 mL at 08/19/15 1422  . feeding supplement (PRO-STAT SUGAR FREE 64) liquid 30 mL  30 mL Oral Q1500 Albertine Patricia, MD   30 mL at 08/19/15 1422  . gabapentin (NEURONTIN) capsule 300 mg  300 mg Oral BID Toy Baker, MD   300 mg at 08/19/15 0851  . heparin injection 5,000 Units  5,000 Units Subcutaneous Q8H Albertine Patricia, MD   5,000 Units at 08/19/15 1422  . lisinopril (PRINIVIL,ZESTRIL) tablet 20 mg  20 mg Oral BID Toy Baker, MD   20 mg at 08/19/15 0852   And  . hydrochlorothiazide (MICROZIDE) capsule 12.5 mg  12.5 mg Oral BID Toy Baker, MD   12.5 mg at 08/19/15 0851  . insulin aspart (novoLOG) injection 0-9 Units  0-9 Units Subcutaneous TID WC Albertine Patricia, MD   2 Units at 08/19/15 1238  . insulin aspart (novoLOG) injection 3 Units  3 Units Subcutaneous TID WC [place] Marisa Hua, MD   3 Units at 08/19/15 1238  . insulin glargine (LANTUS) injection 12 Units  12 Units Subcutaneous Daily Albertine Patricia, MD   12 Units at 08/19/15 [phone]  . morphine 2 MG/ML injection 1 mg  1 mg Intravenous Q3H PRN Toy Baker, MD   1 mg at 08/18/15 0006  . ondansetron (ZOFRAN) tablet 4 mg  4 mg Oral Q6H PRN Toy Baker, MD       Or  . ondansetron (ZOFRAN) injection 4 mg  4 mg  Intravenous Q6H PRN Toy Baker, MD   4 mg at 08/16/15 0942  . oxyCODONE-acetaminophen (PERCOCET/ROXICET) 5-325 MG per tablet 1-2 tablet  1-2 tablet Oral Q4H PRN Gabriel Earing, PA-C   2 tablet at 08/19/15 1422  . simvastatin (ZOCOR) tablet 20 mg  20 mg Oral q1800 Albertine Patricia, MD   20 mg at 08/17/15 1855  . tamsulosin (FLOMAX) capsule 0.4 mg  0.4 mg Oral Daily Toy Baker, MD   0.4 mg at 08/19/15 0851  . thiamine (VITAMIN B-1) tablet 100 mg  100 mg Oral Daily Toy Baker, MD   100 mg at 08/19/15 [id number]     Discharge Medications: Please see discharge summary for a list of discharge medications.  Relevant Imaging Results:  Relevant Lab Results:   Additional Information SS#: [id number]  Junie Spencer, LCSW

## 2015-08-20 LAB — CULTURE, BLOOD (ROUTINE X 2)
Culture: NO GROWTH
Culture: NO GROWTH

## 2015-08-20 LAB — GLUCOSE, CAPILLARY
GLUCOSE-CAPILLARY: 149 mg/dL — AB (ref 65–99)
GLUCOSE-CAPILLARY: 166 mg/dL — AB (ref 65–99)
GLUCOSE-CAPILLARY: 168 mg/dL — AB (ref 65–99)
Glucose-Capillary: 184 mg/dL — ABNORMAL HIGH (ref 65–99)

## 2015-08-20 NOTE — Progress Notes (Signed)
Occupational Therapy Treatment Patient Details Name: Cory Weber MRN: [id number] DOB: Feb 20, 1950 Today's Date: 08/20/2015    History of present illness 65 yo M who was recently hospitalized for LLE cellulitis / wounds, d/c on 6/29 (refused SNF), admitted again with LLE wound / wet gangrene worsening and now with maggots, Status post left BKA by Dr. Trula Slade on 08/16/2015. PMH: with history of HTN, PVD, DM2, CKD III, asthma, neuropathy, Hep C, CVA, back surgery.     OT comments  Pt progressing towards acute OT goals. Focus of session was functional transfers. Completed stand-pivot transfer from EOB to recliner. Pt reporting some dizziness in standing position. Vitals assessed once sitting in recliner: bp 125/79, pulse 103, O2 on RA 98. Reclined pt to supine position in chair. Nursing notified.     Follow Up Recommendations  CIR    Equipment Recommendations  Other (comment) (defer to next venue)    Recommendations for Other Services      Precautions / Restrictions Precautions Precautions: Fall Restrictions Weight Bearing Restrictions: Yes LLE Weight Bearing: Non weight bearing       Mobility Bed Mobility Overal bed mobility: Needs Assistance Bed Mobility: Supine to Sit     Supine to sit: Min guard     General bed mobility comments: bed rails to assist. some cueing for technique and to come fully to EOB.  Transfers Overall transfer level: Needs assistance Equipment used: Rolling walker (2 wheeled) Transfers: Sit to/from [facility] Sit to Stand: Mod assist;From elevated surface Stand pivot transfers: Min assist;From elevated surface       General transfer comment: assist during power up and for balance. Cues throughout for technique with rw.     Balance Overall balance assessment: Needs assistance Sitting-balance support: Feet supported;No upper extremity supported Sitting balance-Leahy Scale: Good     Standing balance support: Bilateral upper  extremity supported Standing balance-Leahy Scale: Poor Standing balance comment: rw and min guard/min A for balance                   ADL Overall ADL's : Needs assistance/impaired                                       General ADL Comments: Pt completed bed mobility and SPT to recliner to simulate functional transfers. Pt reporting dizziness. VSS. Pt also reporting phantom limb pain/sensations. Discussed mirror box therapy .      Vision                     Perception     Praxis      Cognition   Behavior During Therapy: WFL for tasks assessed/performed Overall Cognitive Status: No family/caregiver present to determine baseline cognitive functioning (some decreased safety, problem solving deficits noted)                       Extremity/Trunk Assessment               Exercises     Shoulder Instructions       General Comments      Pertinent Vitals/ Pain       Pain Assessment: 0-10 Pain Score: 8  Pain Location: LLE after transfer Pain Descriptors / Indicators: Other (Comment);Sore (reporting some phantom pain) Pain Intervention(s): Limited activity within patient's tolerance;Monitored during session;Repositioned  Home Living  Prior Functioning/Environment              Frequency Min 2X/week     Progress Toward Goals  OT Goals(current goals can now be found in the care plan section)  Progress towards OT goals: Progressing toward goals  Acute Rehab OT Goals Patient Stated Goal: be able to eventually get back home OT Goal Formulation: With patient/family Time For Goal Achievement: 08/25/15 Potential to Achieve Goals: Good ADL Goals Pt Will Perform Grooming: with supervision;with set-up;sitting Pt Will Perform Lower Body Bathing: with min assist;sitting/lateral leans Pt Will Perform Lower Body Dressing: with mod assist;sitting/lateral leans Pt Will Transfer  to Toilet: with mod assist;with min assist;bedside commode Pt Will Perform Toileting - Clothing Manipulation and hygiene: with mod assist;sitting/lateral leans Pt Will Perform Tub/Shower Transfer: with min assist;3 in 1  Plan Discharge plan remains appropriate    Co-evaluation                 End of Session Equipment Utilized During Treatment: Gait belt;Rolling walker   Activity Tolerance Other (comment) (dizziness during and after stand-pivot)   Patient Left in chair;with call bell/phone within reach   Nurse Communication Other (comment) (dizziness, pt supine in recliner, VSS)        Time: [id number] OT Time Calculation (min): 30 min  Charges: OT General Charges $OT Visit: 1 Procedure OT Treatments $Self Care/Home Management : 23-37 mins  Hortencia Pilar 08/20/2015, 11:29 AM

## 2015-08-20 NOTE — Progress Notes (Signed)
Patient ID: Cory Weber, male   DOB: 09-11-1950, 65 y.o.   MRN: [id number] Stable overall. Waiting rehabilitation or skilled nursing transfer. Below-knee indication dressing change. Excellent healing. No issues and pain under control. Will not follow actively. Please call if we can assist. Will see in the office for follow-up in 3 weeks

## 2015-08-20 NOTE — Progress Notes (Signed)
Physical Therapy Treatment Patient Details Name: Cory Weber MRN: [id number] DOB: [birth year] Today's Date: 08/20/2015    History of Present Illness 65 yo M who was recently hospitalized for LLE cellulitis / wounds, d/c on 6/29 (refused SNF), admitted again with LLE wound / wet gangrene worsening and now with maggots, Status post left BKA by Dr. Trula Slade on 08/16/2015. PMH: with history of HTN, PVD, DM2, CKD III, asthma, neuropathy, Hep C, CVA, back surgery.      PT Comments    Pt performed two transfers with decreased assistance from previous session.  No complaints of dizziness and pain well managed.  Pt unable to comprehend gait sequencing to advance mobility.  Pt reports feeling like his foot is still there and almost presents as a mental block for mobility.  Pt educated patient on desensitization techniques and demonstrated gait sequencing.  Pt remains to progress slowly.    Follow Up Recommendations  CIR     Equipment Recommendations  Wheelchair (measurements PT);Wheelchair cushion (measurements PT)    Recommendations for Other Services Rehab consult     Precautions / Restrictions Precautions Precautions: Fall Restrictions Weight Bearing Restrictions: Yes LLE Weight Bearing: Non weight bearing    Mobility  Bed Mobility Overal bed mobility: Needs Assistance Bed Mobility: Supine to Sit     Supine to sit: Min assist     General bed mobility comments: Pt used bed rail and PTA for support to elevate trunk, min assist to scoot L hip forward.    Transfers Overall transfer level: Needs assistance Equipment used: Rolling walker (2 wheeled) Transfers: Sit to/from Stand Sit to Stand: Min assist;Min guard (1st attempt min assist, 2nd attempt min guard.  ) Stand pivot transfers: Mod assist       General transfer comment: Pt required cues for hand placement and increased assist to pivot to recliner chair.    Ambulation/Gait Ambulation/Gait assistance: Mod  assist Ambulation Distance (Feet): 2 Feet Assistive device: Rolling walker (2 wheeled) Gait Pattern/deviations:  (Pt performed two shuffling hop to steps.  pt fearful to commit to technique to advance gait distance.  ) Gait velocity: slow pattern   General Gait Details: as pt fatigued, pt safety awareness and compliance with cues for safety decreased. Having to bring chair from behind and assist pt to sitting. Pt remains to self limit progression of gait distance.     Stairs            Wheelchair Mobility    Modified Rankin (Stroke Patients Only)       Balance Overall balance assessment: Needs assistance   Sitting balance-Leahy Scale: Good       Standing balance-Leahy Scale: Poor                      Cognition Arousal/Alertness: Awake/alert Behavior During Therapy: Anxious Overall Cognitive Status:  (problem solving and fear to ambulate (mental block))                      Exercises      General Comments        Pertinent Vitals/Pain Pain Assessment: 0-10 Pain Score: 4  Pain Location: LLE, reports phantom pain.   Pain Descriptors / Indicators: Sore Pain Intervention(s): Monitored during session;Repositioned    Home Living                      Prior Function  PT Goals (current goals can now be found in the care plan section) Acute Rehab PT Goals Patient Stated Goal: be able to eventually get back home Potential to Achieve Goals: Good Progress towards PT goals: Progressing toward goals    Frequency  Min 3X/week    PT Plan Current plan remains appropriate    Co-evaluation             End of Session Equipment Utilized During Treatment: Gait belt Activity Tolerance: Patient limited by fatigue Patient left: in chair;with call bell/phone within reach (LLE elevated.  )     [id number] PT Time Calculation (min) (ACUTE ONLY): 20 min  Charges:  $Therapeutic Exercise: 8-22 mins                    G  Codes:      Cristela Blue August 21, 2015, 5:26 PM Governor Rooks, PTA pager [phone]

## 2015-08-20 NOTE — Progress Notes (Signed)
PROGRESS NOTE                                                                                                                                                                                                             Patient Demographics:    Cory Weber, is a 65 y.o. male, DOB - 12-09-1950, [id number]  Admit date - 08/14/2015   Admitting Physician Cory Baker, MD  Outpatient Primary MD for the patient is Cory Font, MD  LOS - 5  Outpatient Specialists: Cory Weber   Chief Complaint  Patient presents with  . Wound Infection       Brief Narrative   65 yo M with history of HTN, PVD, DM2, CKD III, recently hospitalized for LLE cellulitis / wounds, d/c on 6/29 (of note, he refused SNF), admitted again with LLE wound / wet gangrene worsening and now with maggots, Status post left BKA by Cory Weber on 08/16/2015.  Awaiting CIR admission.    Subjective:    Cory Weber without complaints, motivated to go to rehab   Assessment  & Plan :    Active Problems:   Accelerated hypertension   Peripheral vascular disease, unspecified ([place])   Diabetes mellitus with foot ulcer, without long-term current use of insulin (HCC)   Chronic kidney disease, stage 3   Essential hypertension   Diabetic wet gangrene of the foot (HCC)   Chronic hepatitis C without hepatic coma (HCC)   History of CVA (cerebrovascular accident) without residual deficits   Leukocytosis   SIRS (systemic inflammatory response syndrome) (HCC)   Tachycardia   Hyponatremia   Wet Gangrene /cellulitis  - he was recently evaluated by vascular surgery and Cory Weber did balloon angioplasty, left popliteal artery, balloon angioplasty, left tibioperoneal trunk and failed balloon angioplasty, left posterior tibial artery - Finish IV antibiotics 7/7.  Oral antibiotics started 7/8. - Vascular Surgery appreciated, status post left BKA by Cory Weber on 7/5, post-op care per  Cory Weber.  Diabetes mellitus with foot ulcer, without long-term current use of insulin ([place]) - most recent A1C 7.0 on 07/24/15 - Continue to hold oral hypoglycemic agent, continue with insulin sliding scale, lantus and adding prandial novolog - expect blood sugars to improve when he's in rehab and a lot more physically active  CBG (last 3)   Recent Labs  08/19/15 1644  08/19/15 2208 08/20/15 0628  GLUCAP 235* 135* 184*   Essential hypertension  - continue home medications, currently stable and controlled  Peripheral vascular disease  - vascular surgery consulted  Chronic kidney disease, stage 3  - currently creatinine better than baseline will avoid nephrotoxic medications  Anemia  - patient have recently required transfusions due to iron deficiency anemia will avoid NSAID use. - Hemoglobin improved after transfused 1 unit PRBC 7/6.  Fever -much improved, blood cultures were sent: NGTD, encouraged to use incentive spirometry.    Hyperlipidemia - Resume on Zocor  History of CVA - Will resume on aspirin  Code Status : Full  Family Communication  : udated at bedside  Disposition Plan  :  CIR   Consults  :  Orthopedic  Procedures  : Left BKA by Cory Weber 7/5  DVT Prophylaxis  :   Heparin  Lab Results  Component Value Date   PLT 355 08/18/2015    Antibiotics  :    Anti-infectives    Start     Dose/Rate Route Frequency Ordered Stop   08/19/15 1000  doxycycline (VIBRA-TABS) tablet 100 mg     100 mg Oral Every 12 hours 08/18/15 0825     08/15/15 1200  vancomycin (VANCOCIN) IVPB 1000 mg/200 mL premix     1,000 mg 200 mL/hr over 60 Minutes Intravenous Every 12 hours 08/15/15 0306 08/18/15 1254   08/15/15 0800  piperacillin-tazobactam (ZOSYN) IVPB 3.375 g     3.375 g 12.5 mL/hr over 240 Minutes Intravenous Every 8 hours 08/15/15 0306 08/18/15 2359   08/15/15 0030  vancomycin (VANCOCIN) IVPB 1000 mg/200 mL premix     1,000 mg 200 mL/hr over 60 Minutes  Intravenous  Once 08/15/15 0025 08/15/15 0212   08/15/15 0030  piperacillin-tazobactam (ZOSYN) IVPB 3.375 g     3.375 g 100 mL/hr over 30 Minutes Intravenous  Once 08/15/15 0025 08/15/15 0243        Objective:   Filed Vitals:   08/19/15 0500 08/19/15 1449 08/19/15 2209 08/20/15 0445  BP: 113/79 118/92 111/68 124/76  Pulse: 100 93 100 103  Temp: 99 F (37.2 C) 98.5 F (36.9 C) 99.2 F (37.3 C) 98.5 F (36.9 C)  TempSrc:  Oral Oral Oral  Resp: 17 16 16 16   Height:      Weight:      SpO2: 100% 100% 93% 97%    Wt Readings from Last 3 Encounters:  08/15/15 199 lb 1.2 oz (90.3 kg)  07/23/15 199 lb (90.266 kg)  07/18/15 199 lb 14.4 oz (90.674 kg)     Intake/Output Summary (Last 24 hours) at 08/20/15 0911 Last data filed at 08/20/15 0854  Gross per 24 hour  Intake   1440 ml  Output   1550 ml  Net   -110 ml   Physical Exam  Awake Alert, Oriented X 3, No new F.N deficits, Normal affect Cory Weber.AT,PERRAL Supple Neck,No JVD, No cervical lymphadenopathy appriciated.  Symmetrical Chest wall movement, Good air movement bilaterally, CTAB RRR,No Gallops,Rubs or new Murmurs, No Parasternal Heave +ve B.Sounds, Abd Soft, No tenderness, No organomegaly appriciated, No rebound - guarding or rigidity. No Cyanosis, Clubbing or edema,Left BKA,  with Ace wrap, no evidence of bleed or drainage    Data Review:    CBC  Recent Labs Lab 08/14/15 2019 08/15/15 0615 08/16/15 1614 08/17/15 0350 08/18/15 0628  WBC 11.3* 9.4 11.0* 10.1 12.3*  HGB 8.0* 7.5* 8.0* 7.3* 8.8*  HCT 26.6* 25.0* 26.0* 23.4* 27.7*  PLT 387 357 365 346 355  MCV 89.3 88.3 88.4 87.6 86.3  MCH 26.8 26.5 27.2 27.3 27.4  MCHC 30.1 30.0 30.8 31.2 31.8  RDW 14.7 14.6 14.4 14.4 14.3  LYMPHSABS 2.1  --   --   --   --   MONOABS 0.9  --   --   --   --   EOSABS 0.2  --   --   --   --   BASOSABS 0.0  --   --   --   --     Chemistries   Recent Labs Lab 08/14/15 2019 08/15/15 0615 08/17/15 0350 08/18/15 0628  NA  135 136 132* 132*  K 3.8 3.5 4.6 3.9  CL 101 102 96* 96*  CO2 25 27 28 29   GLUCOSE 247* 125* 251* 181*  BUN 12 11 11 12   CREATININE 1.17 1.10 1.18 1.08  CALCIUM 9.2 9.1 9.1 9.4  MG  --  1.6*  --   --   AST 17 13*  --   --   ALT 19 17  --   --   ALKPHOS 71 51  --   --   BILITOT 0.2* 0.6  --   --    ------------------------------------------------------------------------------------------------------------------ No results for input(s): CHOL, HDL, LDLCALC, TRIG, CHOLHDL, LDLDIRECT in the last 72 hours.  Lab Results  Component Value Date   HGBA1C 7.0* 07/24/2015   ------------------------------------------------------------------------------------------------------------------ No results for input(s): TSH, T4TOTAL, T3FREE, THYROIDAB in the last 72 hours.  Invalid input(s): FREET3 ------------------------------------------------------------------------------------------------------------------ No results for input(s): VITAMINB12, FOLATE, FERRITIN, TIBC, IRON, RETICCTPCT in the last 72 hours.  Coagulation profile No results for input(s): INR, PROTIME in the last 168 hours.  No results for input(s): DDIMER in the last 72 hours.  Cardiac Enzymes No results for input(s): CKMB, TROPONINI, MYOGLOBIN in the last 168 hours.  Invalid input(s): CK ------------------------------------------------------------------------------------------------------------------ No results found for: BNP  Inpatient Medications  Scheduled Meds: . acidophilus  2 capsule Oral Daily  . aspirin EC  81 mg Oral Daily  . carvedilol  3.125 mg Oral BID WC  . doxycycline  100 mg Oral Q12H  . famotidine  20 mg Oral Daily  . feeding supplement (GLUCERNA SHAKE)  237 mL Oral TID BM  . feeding supplement (PRO-STAT SUGAR FREE 64)  30 mL Oral Q1500  . gabapentin  300 mg Oral BID  . heparin subcutaneous  5,000 Units Subcutaneous Q8H  . lisinopril  20 mg Oral BID   And  . hydrochlorothiazide  12.5 mg Oral BID  .  insulin aspart  0-9 Units Subcutaneous TID WC  . insulin aspart  3 Units Subcutaneous TID WC  . insulin glargine  12 Units Subcutaneous Daily  . simvastatin  20 mg Oral q1800  . tamsulosin  0.4 mg Oral Daily  . thiamine  100 mg Oral Daily   Continuous Infusions:   PRN Meds:.acetaminophen **OR** acetaminophen, morphine injection, ondansetron **OR** ondansetron (ZOFRAN) IV, oxyCODONE-acetaminophen  Micro Results Recent Results (from the past 240 hour(s))  Blood culture (routine x 2)     Status: None (Preliminary result)   Collection Time: 08/15/15  1:02 AM  Result Value Ref Range Status   Specimen Description BLOOD RIGHT ARM  Final   Special Requests BOTTLES DRAWN AEROBIC AND ANAEROBIC 5ML  Final   Culture NO GROWTH 4 DAYS  Final   Report Status PENDING  Incomplete  Blood culture (routine x 2)     Status: None (Preliminary result)   Collection  Time: 08/15/15  1:07 AM  Result Value Ref Range Status   Specimen Description BLOOD RIGHT HAND  Final   Special Requests BOTTLES DRAWN AEROBIC AND ANAEROBIC 5ML  Final   Culture NO GROWTH 4 DAYS  Final   Report Status PENDING  Incomplete  Surgical pcr screen     Status: None   Collection Time: 08/15/15  4:56 AM  Result Value Ref Range Status   MRSA, PCR NEGATIVE NEGATIVE Final   Staphylococcus aureus NEGATIVE NEGATIVE Final    Comment:        The Xpert SA Assay (FDA approved for NASAL specimens in patients over 7 years of age), is one component of a comprehensive surveillance program.  Test performance has been validated by [facility] for patients greater than or equal to 33 year old. It is not intended to diagnose infection nor to guide or monitor treatment.   Culture, blood (single)     Status: None (Preliminary result)   Collection Time: 08/16/15 10:55 PM  Result Value Ref Range Status   Specimen Description BLOOD RIGHT ANTECUBITAL  Final   Special Requests BOTTLES DRAWN AEROBIC AND ANAEROBIC 5CC   Final   Culture NO GROWTH  3 DAYS  Final   Report Status PENDING  Incomplete    Radiology Reports US Renal  07/27/2015  CLINICAL DATA:  Acute kidney injury EXAM: RENAL / URINARY TRACT ULTRASOUND COMPLETE COMPARISON:  None. FINDINGS: Right Kidney: Length: 12.6 cm. Mildly increased echogenicity. No hydronephrosis or mass. Left Kidney: Length: 12.6 cm. Mildly increased echogenicity. No hydronephrosis or mass. Bladder: Not evaluated.  Decompressed by Foley catheter. IMPRESSION: Evidence of medical renal disease Electronically Signed   By: Skipper Cliche M.D.   On: 07/27/2015 11:21   Dg Foot 2 Views Left  07/24/2015  CLINICAL DATA:  Cellulitis with wound on first toe EXAM: LEFT FOOT - 2 VIEW COMPARISON:  July 18, 2015 FINDINGS: Frontal and lateral views were obtained. There is no demonstrable fracture or dislocation. The joint spaces appear normal. No erosive change or bony destruction. There is soft tissue swelling over the dorsum of the foot. There is no soft tissue air or radiopaque foreign body. IMPRESSION: No bony destruction or erosion. No fracture or dislocation. Soft tissue swelling over the dorsal foot. No soft tissue abscess or radiopaque foreign body evident. Electronically Signed   By: Lowella Grip III M.D.   On: 07/24/2015 07:23   Ravon Mcilhenny M.D on 08/20/2015 at 9:11 AM  Between 7am to 7pm - Pager - [phone]  After 7pm go to [email or web address] - password [facility]  [facility] -  Office  [phone]

## 2015-08-21 ENCOUNTER — Encounter (HOSPITAL_COMMUNITY): Payer: Self-pay | Admitting: Nurse Practitioner

## 2015-08-21 ENCOUNTER — Inpatient Hospital Stay (HOSPITAL_COMMUNITY)
Admission: RE | Admit: 2015-08-21 | Discharge: 2015-09-05 | DRG: 560 | Disposition: A | Discharge status: Skilled Nursing Facility | Payer: Medicaid Other | Source: Intra-hospital | Attending: Physical Medicine & Rehabilitation | Admitting: Physical Medicine & Rehabilitation

## 2015-08-21 DIAGNOSIS — I10 Essential (primary) hypertension: Secondary | ICD-10-CM | POA: Insufficient documentation

## 2015-08-21 DIAGNOSIS — B182 Chronic viral hepatitis C: Secondary | ICD-10-CM | POA: Diagnosis present

## 2015-08-21 DIAGNOSIS — N400 Enlarged prostate without lower urinary tract symptoms: Secondary | ICD-10-CM | POA: Diagnosis present

## 2015-08-21 DIAGNOSIS — I739 Peripheral vascular disease, unspecified: Secondary | ICD-10-CM | POA: Diagnosis not present

## 2015-08-21 DIAGNOSIS — F329 Major depressive disorder, single episode, unspecified: Secondary | ICD-10-CM | POA: Diagnosis not present

## 2015-08-21 DIAGNOSIS — K5900 Constipation, unspecified: Secondary | ICD-10-CM | POA: Diagnosis present

## 2015-08-21 DIAGNOSIS — E1151 Type 2 diabetes mellitus with diabetic peripheral angiopathy without gangrene: Secondary | ICD-10-CM | POA: Diagnosis present

## 2015-08-21 DIAGNOSIS — Z7984 Long term (current) use of oral hypoglycemic drugs: Secondary | ICD-10-CM

## 2015-08-21 DIAGNOSIS — Z4781 Encounter for orthopedic aftercare following surgical amputation: Secondary | ICD-10-CM | POA: Diagnosis not present

## 2015-08-21 DIAGNOSIS — E1142 Type 2 diabetes mellitus with diabetic polyneuropathy: Secondary | ICD-10-CM | POA: Diagnosis present

## 2015-08-21 DIAGNOSIS — D62 Acute posthemorrhagic anemia: Secondary | ICD-10-CM | POA: Diagnosis present

## 2015-08-21 DIAGNOSIS — Z79899 Other long term (current) drug therapy: Secondary | ICD-10-CM

## 2015-08-21 DIAGNOSIS — R6510 Systemic inflammatory response syndrome (SIRS) of non-infectious origin without acute organ dysfunction: Secondary | ICD-10-CM | POA: Diagnosis not present

## 2015-08-21 DIAGNOSIS — N189 Chronic kidney disease, unspecified: Secondary | ICD-10-CM | POA: Diagnosis present

## 2015-08-21 DIAGNOSIS — Z7982 Long term (current) use of aspirin: Secondary | ICD-10-CM | POA: Diagnosis not present

## 2015-08-21 DIAGNOSIS — Z981 Arthrodesis status: Secondary | ICD-10-CM | POA: Diagnosis not present

## 2015-08-21 DIAGNOSIS — R269 Unspecified abnormalities of gait and mobility: Secondary | ICD-10-CM | POA: Insufficient documentation

## 2015-08-21 DIAGNOSIS — Z87891 Personal history of nicotine dependence: Secondary | ICD-10-CM

## 2015-08-21 DIAGNOSIS — E1122 Type 2 diabetes mellitus with diabetic chronic kidney disease: Secondary | ICD-10-CM | POA: Diagnosis present

## 2015-08-21 DIAGNOSIS — F1010 Alcohol abuse, uncomplicated: Secondary | ICD-10-CM | POA: Insufficient documentation

## 2015-08-21 DIAGNOSIS — D638 Anemia in other chronic diseases classified elsewhere: Secondary | ICD-10-CM | POA: Insufficient documentation

## 2015-08-21 DIAGNOSIS — Z89512 Acquired absence of left leg below knee: Secondary | ICD-10-CM

## 2015-08-21 DIAGNOSIS — Z8673 Personal history of transient ischemic attack (TIA), and cerebral infarction without residual deficits: Secondary | ICD-10-CM

## 2015-08-21 DIAGNOSIS — E785 Hyperlipidemia, unspecified: Secondary | ICD-10-CM | POA: Diagnosis present

## 2015-08-21 DIAGNOSIS — Z0000 Encounter for general adult medical examination without abnormal findings: Secondary | ICD-10-CM

## 2015-08-21 DIAGNOSIS — E1152 Type 2 diabetes mellitus with diabetic peripheral angiopathy with gangrene: Principal | ICD-10-CM

## 2015-08-21 DIAGNOSIS — L089 Local infection of the skin and subcutaneous tissue, unspecified: Secondary | ICD-10-CM | POA: Diagnosis present

## 2015-08-21 DIAGNOSIS — G546 Phantom limb syndrome with pain: Secondary | ICD-10-CM | POA: Insufficient documentation

## 2015-08-21 DIAGNOSIS — I129 Hypertensive chronic kidney disease with stage 1 through stage 4 chronic kidney disease, or unspecified chronic kidney disease: Secondary | ICD-10-CM | POA: Diagnosis present

## 2015-08-21 DIAGNOSIS — J45909 Unspecified asthma, uncomplicated: Secondary | ICD-10-CM | POA: Diagnosis present

## 2015-08-21 DIAGNOSIS — E875 Hyperkalemia: Secondary | ICD-10-CM | POA: Diagnosis not present

## 2015-08-21 DIAGNOSIS — D649 Anemia, unspecified: Secondary | ICD-10-CM | POA: Diagnosis not present

## 2015-08-21 DIAGNOSIS — R509 Fever, unspecified: Secondary | ICD-10-CM | POA: Diagnosis not present

## 2015-08-21 DIAGNOSIS — S88112A Complete traumatic amputation at level between knee and ankle, left lower leg, initial encounter: Secondary | ICD-10-CM

## 2015-08-21 DIAGNOSIS — N183 Chronic kidney disease, stage 3 (moderate): Secondary | ICD-10-CM | POA: Diagnosis not present

## 2015-08-21 DIAGNOSIS — E1159 Type 2 diabetes mellitus with other circulatory complications: Secondary | ICD-10-CM | POA: Diagnosis not present

## 2015-08-21 LAB — CULTURE, BLOOD (SINGLE): CULTURE: NO GROWTH

## 2015-08-21 LAB — CBC
HEMATOCRIT: 27.5 % — AB (ref 39.0–52.0)
HEMOGLOBIN: 8.7 g/dL — AB (ref 13.0–17.0)
MCH: 27.4 pg (ref 26.0–34.0)
MCHC: 31.6 g/dL (ref 30.0–36.0)
MCV: 86.8 fL (ref 78.0–100.0)
Platelets: 453 10*3/uL — ABNORMAL HIGH (ref 150–400)
RBC: 3.17 MIL/uL — AB (ref 4.22–5.81)
RDW: 14.4 % (ref 11.5–15.5)
WBC: 10.7 10*3/uL — AB (ref 4.0–10.5)

## 2015-08-21 LAB — COMPREHENSIVE METABOLIC PANEL
ALBUMIN: 2.6 g/dL — AB (ref 3.5–5.0)
ALK PHOS: 49 U/L (ref 38–126)
ALT: 15 U/L — AB (ref 17–63)
AST: 21 U/L (ref 15–41)
Anion gap: 9 (ref 5–15)
BILIRUBIN TOTAL: 0.4 mg/dL (ref 0.3–1.2)
BUN: 32 mg/dL — AB (ref 6–20)
CALCIUM: 9.4 mg/dL (ref 8.9–10.3)
CO2: 28 mmol/L (ref 22–32)
Chloride: 95 mmol/L — ABNORMAL LOW (ref 101–111)
Creatinine, Ser: 1.42 mg/dL — ABNORMAL HIGH (ref 0.61–1.24)
GFR calc Af Amer: 60 mL/min — ABNORMAL LOW (ref >60)
GFR calc non Af Amer: 51 mL/min — ABNORMAL LOW (ref >60)
GLUCOSE: 163 mg/dL — AB (ref 65–99)
Potassium: 4.1 mmol/L (ref 3.5–5.1)
Sodium: 132 mmol/L — ABNORMAL LOW (ref 135–145)
TOTAL PROTEIN: 8.4 g/dL — AB (ref 6.5–8.1)

## 2015-08-21 LAB — GLUCOSE, CAPILLARY
GLUCOSE-CAPILLARY: 179 mg/dL — AB (ref 65–99)
GLUCOSE-CAPILLARY: 157 mg/dL — AB (ref 65–99)
Glucose-Capillary: 174 mg/dL — ABNORMAL HIGH (ref 65–99)
Glucose-Capillary: 294 mg/dL — ABNORMAL HIGH (ref 65–99)

## 2015-08-21 MED ORDER — POLYETHYLENE GLYCOL 3350 17 G PO PACK
17.0000 g | PACK | Freq: Every day | ORAL | Status: DC
  Administered 2015-08-22 – 2015-09-05 (×14): 17 g via ORAL
  Filled 2015-08-21 (×15): qty 1

## 2015-08-21 MED ORDER — SENNOSIDES-DOCUSATE SODIUM 8.6-50 MG PO TABS
2.0000 | ORAL_TABLET | Freq: Two times a day (BID) | ORAL | Status: DC
  Administered 2015-08-21: 2 via ORAL
  Filled 2015-08-21: qty 2

## 2015-08-21 MED ORDER — SENNOSIDES-DOCUSATE SODIUM 8.6-50 MG PO TABS: 2.0000 | ORAL_TABLET | Freq: Two times a day (BID) | ORAL | Status: DC

## 2015-08-21 MED ORDER — RISAQUAD PO CAPS
2.0000 | ORAL_CAPSULE | Freq: Every day | ORAL | Status: DC
  Administered 2015-08-22 – 2015-09-05 (×15): 2 via ORAL
  Filled 2015-08-21 (×15): qty 2

## 2015-08-21 MED ORDER — INSULIN ASPART 100 UNIT/ML ~~LOC~~ SOLN
3.0000 [IU] | Freq: Three times a day (TID) | SUBCUTANEOUS | Status: DC
  Administered 2015-08-21 – 2015-08-23 (×7): 3 [IU] via SUBCUTANEOUS

## 2015-08-21 MED ORDER — ONDANSETRON HCL 4 MG/2ML IJ SOLN: 4.0000 mg | Freq: Four times a day (QID) | INTRAMUSCULAR | Status: DC | PRN

## 2015-08-21 MED ORDER — HEPARIN SODIUM (PORCINE) 5000 UNIT/ML IJ SOLN
5000.0000 [IU] | Freq: Three times a day (TID) | INTRAMUSCULAR | Status: DC
  Administered 2015-08-21 – 2015-08-25 (×11): 5000 [IU] via SUBCUTANEOUS
  Filled 2015-08-21 (×11): qty 1

## 2015-08-21 MED ORDER — ACETAMINOPHEN 325 MG PO TABS
650.0000 mg | ORAL_TABLET | Freq: Four times a day (QID) | ORAL | Status: DC | PRN
  Administered 2015-08-22 – 2015-08-29 (×2): 650 mg via ORAL
  Filled 2015-08-21 (×2): qty 2

## 2015-08-21 MED ORDER — RISAQUAD PO CAPS: 2.0000 | ORAL_CAPSULE | Freq: Every day | ORAL | Status: DC

## 2015-08-21 MED ORDER — GLUCERNA SHAKE PO LIQD: 237.0000 mL | Freq: Three times a day (TID) | ORAL | Status: DC

## 2015-08-21 MED ORDER — DOXYCYCLINE HYCLATE 100 MG PO TABS: 100.0000 mg | ORAL_TABLET | Freq: Two times a day (BID) | ORAL | Status: DC

## 2015-08-21 MED ORDER — HYDROCHLOROTHIAZIDE 12.5 MG PO CAPS
12.5000 mg | ORAL_CAPSULE | Freq: Two times a day (BID) | ORAL | Status: DC
  Administered 2015-08-21 – 2015-08-27 (×11): 12.5 mg via ORAL
  Filled 2015-08-21 (×11): qty 1

## 2015-08-21 MED ORDER — INSULIN ASPART 100 UNIT/ML ~~LOC~~ SOLN
0.0000 [IU] | Freq: Three times a day (TID) | SUBCUTANEOUS | Status: DC
  Administered 2015-08-21 – 2015-08-22 (×4): 2 [IU] via SUBCUTANEOUS
  Administered 2015-08-23: 3 [IU] via SUBCUTANEOUS
  Administered 2015-08-23: 2 [IU] via SUBCUTANEOUS
  Administered 2015-08-23: 1 [IU] via SUBCUTANEOUS
  Administered 2015-08-24 – 2015-08-25 (×3): 2 [IU] via SUBCUTANEOUS
  Administered 2015-08-25 – 2015-08-27 (×4): 1 [IU] via SUBCUTANEOUS
  Administered 2015-08-27: 2 [IU] via SUBCUTANEOUS
  Administered 2015-08-28: 1 [IU] via SUBCUTANEOUS
  Administered 2015-08-29: 2 [IU] via SUBCUTANEOUS
  Administered 2015-08-30 – 2015-08-31 (×2): 1 [IU] via SUBCUTANEOUS
  Administered 2015-08-31: 3 [IU] via SUBCUTANEOUS
  Administered 2015-08-31: 1 [IU] via SUBCUTANEOUS
  Administered 2015-09-01 – 2015-09-02 (×3): 2 [IU] via SUBCUTANEOUS
  Administered 2015-09-02: 1 [IU] via SUBCUTANEOUS
  Administered 2015-09-03: 2 [IU] via SUBCUTANEOUS
  Administered 2015-09-03: 5 [IU] via SUBCUTANEOUS
  Administered 2015-09-05 (×2): 1 [IU] via SUBCUTANEOUS

## 2015-08-21 MED ORDER — DOXYCYCLINE HYCLATE 100 MG PO TABS
100.0000 mg | ORAL_TABLET | Freq: Two times a day (BID) | ORAL | Status: DC
  Administered 2015-08-21 – 2015-08-24 (×6): 100 mg via ORAL
  Filled 2015-08-21 (×6): qty 1

## 2015-08-21 MED ORDER — POLYETHYLENE GLYCOL 3350 17 G PO PACK
17.0000 g | PACK | Freq: Every day | ORAL | Status: DC
  Administered 2015-08-21: 17 g via ORAL
  Filled 2015-08-21: qty 1

## 2015-08-21 MED ORDER — SENNOSIDES-DOCUSATE SODIUM 8.6-50 MG PO TABS
2.0000 | ORAL_TABLET | Freq: Two times a day (BID) | ORAL | Status: DC
  Administered 2015-08-21 – 2015-09-05 (×29): 2 via ORAL
  Filled 2015-08-21 (×31): qty 2

## 2015-08-21 MED ORDER — ASPIRIN EC 81 MG PO TBEC
81.0000 mg | DELAYED_RELEASE_TABLET | Freq: Every day | ORAL | Status: DC
  Administered 2015-08-22 – 2015-09-05 (×15): 81 mg via ORAL
  Filled 2015-08-21 (×15): qty 1

## 2015-08-21 MED ORDER — CARVEDILOL 3.125 MG PO TABS
3.1250 mg | ORAL_TABLET | Freq: Two times a day (BID) | ORAL | Status: DC
  Administered 2015-08-21 – 2015-09-05 (×28): 3.125 mg via ORAL
  Filled 2015-08-21 (×29): qty 1

## 2015-08-21 MED ORDER — POLYETHYLENE GLYCOL 3350 17 G PO PACK: 17.0000 g | PACK | Freq: Every day | ORAL | Status: DC

## 2015-08-21 MED ORDER — OXYCODONE-ACETAMINOPHEN 5-325 MG PO TABS
1.0000 | ORAL_TABLET | ORAL | Status: DC | PRN
  Administered 2015-08-21 (×2): 2 via ORAL
  Administered 2015-08-22 – 2015-08-23 (×5): 1 via ORAL
  Administered 2015-08-24: 2 via ORAL
  Administered 2015-08-24: 1 via ORAL
  Administered 2015-08-24 – 2015-08-29 (×16): 2 via ORAL
  Administered 2015-08-29: 1 via ORAL
  Administered 2015-08-29 – 2015-08-30 (×3): 2 via ORAL
  Administered 2015-08-30: 1 via ORAL
  Administered 2015-08-30 – 2015-09-05 (×31): 2 via ORAL
  Filled 2015-08-21 (×4): qty 2
  Filled 2015-08-21: qty 1
  Filled 2015-08-21 (×4): qty 2
  Filled 2015-08-21 (×2): qty 1
  Filled 2015-08-21 (×16): qty 2
  Filled 2015-08-21: qty 1
  Filled 2015-08-21 (×9): qty 2
  Filled 2015-08-21: qty 1
  Filled 2015-08-21 (×3): qty 2
  Filled 2015-08-21: qty 1
  Filled 2015-08-21 (×5): qty 2
  Filled 2015-08-21: qty 1
  Filled 2015-08-21 (×4): qty 2
  Filled 2015-08-21: qty 1
  Filled 2015-08-21 (×12): qty 2

## 2015-08-21 MED ORDER — FAMOTIDINE 20 MG PO TABS
20.0000 mg | ORAL_TABLET | Freq: Every day | ORAL | Status: DC
  Administered 2015-08-22 – 2015-09-05 (×15): 20 mg via ORAL
  Filled 2015-08-21 (×14): qty 1
  Filled 2015-08-21: qty 2

## 2015-08-21 MED ORDER — INSULIN GLARGINE 100 UNIT/ML ~~LOC~~ SOLN
12.0000 [IU] | Freq: Every day | SUBCUTANEOUS | Status: DC
  Administered 2015-08-22 – 2015-09-01 (×11): 12 [IU] via SUBCUTANEOUS
  Filled 2015-08-21 (×11): qty 0.12

## 2015-08-21 MED ORDER — SORBITOL 70 % SOLN
30.0000 mL | Freq: Every day | Status: DC | PRN
  Administered 2015-08-21 – 2015-08-28 (×3): 30 mL via ORAL
  Filled 2015-08-21 (×4): qty 30

## 2015-08-21 MED ORDER — HEPARIN SODIUM (PORCINE) 5000 UNIT/ML IJ SOLN: 5000.0000 [IU] | Freq: Three times a day (TID) | INTRAMUSCULAR | Status: DC

## 2015-08-21 MED ORDER — PRO-STAT SUGAR FREE PO LIQD: 30.0000 mL | Freq: Every day | ORAL | Status: DC

## 2015-08-21 MED ORDER — TAMSULOSIN HCL 0.4 MG PO CAPS
0.4000 mg | ORAL_CAPSULE | Freq: Every day | ORAL | Status: DC
  Administered 2015-08-22 – 2015-09-05 (×15): 0.4 mg via ORAL
  Filled 2015-08-21 (×15): qty 1

## 2015-08-21 MED ORDER — LISINOPRIL 20 MG PO TABS
20.0000 mg | ORAL_TABLET | Freq: Two times a day (BID) | ORAL | Status: DC
  Administered 2015-08-21 – 2015-09-01 (×21): 20 mg via ORAL
  Filled 2015-08-21 (×22): qty 1

## 2015-08-21 MED ORDER — ACETAMINOPHEN 325 MG PO TABS: 650.0000 mg | ORAL_TABLET | Freq: Four times a day (QID) | ORAL | Status: DC | PRN

## 2015-08-21 MED ORDER — VITAMIN B-1 100 MG PO TABS
100.0000 mg | ORAL_TABLET | Freq: Every day | ORAL | Status: DC
  Administered 2015-08-22 – 2015-09-05 (×15): 100 mg via ORAL
  Filled 2015-08-21 (×15): qty 1

## 2015-08-21 MED ORDER — ONDANSETRON HCL 4 MG PO TABS: 4.0000 mg | ORAL_TABLET | Freq: Four times a day (QID) | ORAL | Status: DC | PRN

## 2015-08-21 MED ORDER — SIMVASTATIN 20 MG PO TABS
20.0000 mg | ORAL_TABLET | Freq: Every day | ORAL | Status: DC
  Administered 2015-08-21 – 2015-09-04 (×15): 20 mg via ORAL
  Filled 2015-08-21 (×15): qty 1

## 2015-08-21 MED ORDER — GABAPENTIN 300 MG PO CAPS
300.0000 mg | ORAL_CAPSULE | Freq: Two times a day (BID) | ORAL | Status: DC
  Administered 2015-08-21 – 2015-08-30 (×18): 300 mg via ORAL
  Filled 2015-08-21 (×18): qty 1

## 2015-08-21 MED ORDER — ACETAMINOPHEN 650 MG RE SUPP: 650.0000 mg | Freq: Four times a day (QID) | RECTAL | Status: DC | PRN

## 2015-08-21 NOTE — Progress Notes (Signed)
      Doing well today, pain well controlled.  Left BKA site clean and dry Dry dressing in place  S/P left BKA Pending CIR today. F/U with Dr. Ilsa Iha  4 weeks from surgery for staple removal  Nick Stults MAUREEN PA-C

## 2015-08-21 NOTE — Clinical Social Work Note (Signed)
Patient to be admitted to CIR, LCSW signing off.  Lubertha Sayres, [place] [facility]: [phone] Surgical: [phone]

## 2015-08-21 NOTE — Discharge Summary (Addendum)
Physician Discharge Summary  INFINITE NAYLOR [id number] DOB: 1950/05/02 DOA: 08/14/2015  PCP: Maggie Font, MD  Admit date: 08/14/2015 Discharge date: 08/21/2015  Discharge to [facility].   Recommendations for Outpatient Follow-up:  1. Follow up with PCP in 1-2 weeks and Dr. Donnetta Hutching in 3 weeks 2. Please obtain BMP/CBC in one week 3. Please follow blood sugars 3 times per day   Discharge Condition: Stable CODE STATUS: Full  Diet recommendation: Heart Healthy / Carb Modified    Brief/Interim Summary: 65 yo M with history of HTN, PVD, DM2, CKD III, recently hospitalized for LLE cellulitis / wounds, d/c on 6/29 (of note, he refused SNF), admitted again with LLE wound / wet gangrene worsening and now with maggots, Status post left BKA by Dr. Trula Slade on 08/16/2015. [facility] admission.   Wet Gangrene /cellulitis  - he was recently evaluated by vascular surgery and Dr. Trula Slade did balloon angioplasty, left popliteal artery, balloon angioplasty, left tibioperoneal trunk and failed balloon angioplasty, left posterior tibial artery - Finish IV antibiotics 7/7. Oral antibiotics started 7/8. - Vascular Surgery appreciated, status post left BKA by Dr. Trula Slade on 7/5, post-op care per Dr.Brabham.  Diabetes mellitus with foot ulcer, without long-term current use of insulin ([place]) - most recent A1C 7.0 on 07/24/15 - Continue to hold oral hypoglycemic agent, continue with insulin sliding scale, lantus and adding prandial novolog - expect blood sugars to improve when he's in rehab and a lot more physically active  CBG (last 3)   Recent Labs (last 2 labs)      Recent Labs  08/19/15 1644 08/19/15 2208 08/20/15 0628  GLUCAP 235* 135* 184*     Essential hypertension  - continue home medications, currently stable and controlled  Peripheral vascular disease  - vascular surgery consulted  Chronic kidney disease, stage 3  - currently creatinine better than baseline will avoid nephrotoxic  medications  Constipation - started oral laxatives daily  Anemia  - patient have recently required transfusions due to iron deficiency anemia will avoid NSAID use. - Hemoglobin improved after transfused 1 unit PRBC 7/6.  Fever -improved, blood cultures were sent: NGTD, encouraged to use incentive spirometry.   Hyperlipidemia - Resume on Zocor  History of CVA - Will resume on aspirin  Code Status : Full  Family Communication : udated at bedside  Disposition Plan : CIR   Consults : Orthopedic  Procedures : Left BKA by Dr. Trula Slade 7/5  DVT Prophylaxis : Heparin     Discharge Diagnoses:  Active Problems:   Accelerated hypertension   Peripheral vascular disease, unspecified ([place])   Diabetes mellitus with foot ulcer, without long-term current use of insulin ([facility])   Chronic kidney disease, stage 3   Essential hypertension   Diabetic wet gangrene of the foot (HCC)   Chronic hepatitis C without hepatic coma (H[facility])   History of CVA (cerebrovascular accident) without residual deficits   Leukocytosis   SIRS (systemic inflammatory response syndrome) ([place])   Tachycardia   Hyponatremia  Discharge Instructions  Discharge Instructions    Discharge instructions    Complete by:  As directed   Monitor blood sugar 3 x per day.  Follow up with PCP and surgery as scheduled.     Increase activity slowly    Complete by:  As directed             Medication List    STOP taking these medications        metFORMIN 1000 MG tablet  Commonly known  as:  GLUCOPHAGE     oxyCODONE-acetaminophen 5-325 MG tablet  Commonly known as:  PERCOCET/ROXICET     silver sulfADIAZINE 1 % cream  Commonly known as:  SILVADENE      TAKE these medications        acetaminophen 325 MG tablet  Commonly known as:  TYLENOL  Take 2 tablets (650 mg total) by mouth every 6 (six) hours as needed for mild pain (or Fever >/= 101).     acidophilus Caps capsule  Take 2 capsules by mouth  daily.     aspirin EC 81 MG tablet  Take 1 tablet (81 mg total) by mouth daily.     carvedilol 3.125 MG tablet  Commonly known as:  COREG  Take 1 tablet (3.125 mg total) by mouth 2 (two) times daily with a meal.     doxycycline 100 MG tablet  Commonly known as:  VIBRA-TABS  Take 1 tablet (100 mg total) by mouth every 12 (twelve) hours.     famotidine 20 MG tablet  Commonly known as:  PEPCID  Take 1 tablet (20 mg total) by mouth daily.     feeding supplement (GLUCERNA SHAKE) Liqd  Take 237 mLs by mouth 3 (three) times daily between meals.     feeding supplement (PRO-STAT SUGAR FREE 64) Liqd  Take 30 mLs by mouth daily at 3 pm.     folic acid 1 MG tablet  Commonly known as:  FOLVITE  Take 1 tablet (1 mg total) by mouth daily.     gabapentin 300 MG capsule  Commonly known as:  NEURONTIN  Take 1 capsule (300 mg total) by mouth 2 (two) times daily.     glipiZIDE 5 MG tablet  Commonly known as:  GLUCOTROL  Take 5 mg by mouth 2 (two) times daily before a meal.     lisinopril-hydrochlorothiazide 20-12.5 MG tablet  Commonly known as:  PRINZIDE,ZESTORETIC  Take 1 tablet by mouth 2 (two) times daily.     multivitamin with minerals Tabs tablet  Take 1 tablet by mouth daily.     simvastatin 20 MG tablet  Commonly known as:  ZOCOR  TAKE ONE TABLET BY MOUTH DAILY AT 6 PM.     tamsulosin 0.4 MG Caps capsule  Commonly known as:  FLOMAX  Take 1 capsule (0.4 mg total) by mouth daily.     thiamine 100 MG tablet  Take 1 tablet (100 mg total) by mouth daily.           Follow-up Information    Follow up with Early, Sherren Mocha, MD. Schedule an appointment as soon as possible for a visit in 3 weeks.   Specialties:  Vascular Surgery, Cardiology   Why:  Hospital Follow Up   Contact information:   [address] [place] [address] [phone]       Follow up with Maggie Font, MD. Schedule an appointment as soon as possible for a visit in 2 weeks.   Specialty:  Family Medicine    Why:  Hospital Follow Up   Contact information:   [address] [place] [address] [phone]      No Known Allergies   Procedures/Studies: US Renal  07/27/2015  CLINICAL DATA:  Acute kidney injury EXAM: RENAL / URINARY TRACT ULTRASOUND COMPLETE COMPARISON:  None. FINDINGS: Right Kidney: Length: 12.6 cm. Mildly increased echogenicity. No hydronephrosis or mass. Left Kidney: Length: 12.6 cm. Mildly increased echogenicity. No hydronephrosis or mass. Bladder: Not evaluated.  Decompressed by Foley catheter. IMPRESSION: Evidence of medical renal disease Electronically Signed   By: Skipper Cliche M.D.   On: 07/27/2015 11:21   Dg Foot 2 Views Left  07/24/2015  CLINICAL DATA:  Cellulitis with wound on first toe EXAM: LEFT FOOT - 2 VIEW COMPARISON:  July 18, 2015 FINDINGS: Frontal and lateral views were obtained. There is no demonstrable fracture or dislocation. The joint spaces appear normal. No erosive change or bony destruction. There is soft tissue swelling over the dorsum of the foot. There is no soft tissue air or radiopaque foreign body. IMPRESSION: No bony destruction or erosion. No fracture or dislocation. Soft tissue swelling over the dorsal foot. No soft tissue abscess or radiopaque foreign body evident. Electronically Signed   By: Lowella Grip III M.D.   On: 07/24/2015 07:23     Subjective: Pt ready to go to rehab.   Discharge Exam: Filed Vitals:   08/20/15 2145 08/21/15 0621  BP: 119/71 108/66  Pulse: 105 96  Temp: 100.6 F (38.1 C) 98.4 F (36.9 C)  Resp: 16 16   Filed Vitals:   08/20/15 1100 08/20/15 1300 08/20/15 2145 08/21/15 0621  BP: 125/79 104/67 119/71 108/66  Pulse: 103 95 105 96  Temp:  98.1 F (36.7 C) 100.6 F (38.1 C) 98.4 F (36.9 C)  TempSrc:  Tympanic Oral Oral  Resp:   16 16  Height:      Weight:      SpO2: 98% 98% 94% 93%   Awake Alert, Oriented X 3, No new F.N deficits, Normal affect [place].AT,PERRAL Supple Neck,No JVD, No cervical  lymphadenopathy appriciated.  Symmetrical Chest wall movement, Good air movement bilaterally, CTAB RRR,No Gallops,Rubs or new Murmurs, No Parasternal Heave +ve B.Sounds, Abd Soft, No tenderness, No organomegaly appriciated, No rebound - guarding or rigidity. No Cyanosis, Clubbing or edema,Left BKA, with Ace wrap, no evidence of bleed or drainage   The results of significant diagnostics from this hospitalization (including imaging, microbiology, ancillary and laboratory) are listed below for reference.     Microbiology: Recent Results (from the past 240 hour(s))  Blood culture (routine x 2)     Status: None   Collection Time: 08/15/15  1:02 AM  Result Value Ref Range Status   Specimen Description BLOOD RIGHT ARM  Final   Special Requests BOTTLES DRAWN AEROBIC AND ANAEROBIC 5ML  Final   Culture NO GROWTH 5 DAYS  Final   Report Status 08/20/2015 FINAL  Final  Blood culture (routine x 2)     Status: None   Collection Time: 08/15/15  1:07 AM  Result Value Ref Range Status   Specimen Description BLOOD RIGHT HAND  Final   Special Requests BOTTLES DRAWN AEROBIC AND ANAEROBIC 5ML  Final   Culture NO GROWTH 5 DAYS  Final   Report Status 08/20/2015 FINAL  Final  Surgical pcr screen     Status: None   Collection Time: 08/15/15  4:56 AM  Result Value Ref Range Status   MRSA, PCR NEGATIVE NEGATIVE Final   Staphylococcus aureus NEGATIVE NEGATIVE Final    Comment:        The Xpert SA Assay (FDA approved for NASAL specimens in patients over 42 years of age), is one component of a comprehensive surveillance program.  Test performance has been validated by [facility] for patients greater than or equal to 14 year old. It is not intended to diagnose infection nor to guide or monitor treatment.   Culture, blood (single)  Status: None (Preliminary result)   Collection Time: 08/16/15 10:55 PM  Result Value Ref Range Status   Specimen Description BLOOD RIGHT ANTECUBITAL  Final   Special  Requests BOTTLES DRAWN AEROBIC AND ANAEROBIC 5CC   Final   Culture NO GROWTH 4 DAYS  Final   Report Status PENDING  Incomplete     Labs: BNP (last 3 results) No results for input(s): BNP in the last 8760 hours. Basic Metabolic Panel:  Recent Labs Lab 08/14/15 2019 08/15/15 0615 08/17/15 0350 08/18/15 0628 08/21/15 0329  NA 135 136 132* 132* 132*  K 3.8 3.5 4.6 3.9 4.1  CL 101 102 96* 96* 95*  CO2 25 27 28 29 28   GLUCOSE 247* 125* 251* 181* 163*  BUN 12 11 11 12  32*  CREATININE 1.17 1.10 1.18 1.08 1.42*  CALCIUM 9.2 9.1 9.1 9.4 9.4  MG  --  1.6*  --   --   --   PHOS  --  4.1  --   --   --    Liver Function Tests:  Recent Labs Lab 08/14/15 2019 08/15/15 0615 08/21/15 0329  AST 17 13* 21  ALT 19 17 15*  ALKPHOS 71 51 49  BILITOT 0.2* 0.6 0.4  PROT 8.2* 7.8 8.4*  ALBUMIN 2.7* 2.5* 2.6*   No results for input(s): LIPASE, AMYLASE in the last 168 hours. No results for input(s): AMMONIA in the last 168 hours. CBC:  Recent Labs Lab 08/14/15 2019 08/15/15 0615 08/16/15 1614 08/17/15 0350 08/18/15 0628 08/21/15 0329  WBC 11.3* 9.4 11.0* 10.1 12.3* 10.7*  NEUTROABS 8.1*  --   --   --   --   --   HGB 8.0* 7.5* 8.0* 7.3* 8.8* 8.7*  HCT 26.6* 25.0* 26.0* 23.4* 27.7* 27.5*  MCV 89.3 88.3 88.4 87.6 86.3 86.8  PLT 387 357 365 346 355 453*   Cardiac Enzymes: No results for input(s): CKTOTAL, CKMB, CKMBINDEX, TROPONINI in the last 168 hours. BNP: Invalid input(s): POCBNP CBG:  Recent Labs Lab 08/20/15 0628 08/20/15 1054 08/20/15 1658 08/20/15 2148 08/21/15 0624  GLUCAP 184* 149* 166* 168* 174*   D-Dimer No results for input(s): DDIMER in the last 72 hours. Hgb A1c No results for input(s): HGBA1C in the last 72 hours. Lipid Profile No results for input(s): CHOL, HDL, LDLCALC, TRIG, CHOLHDL, LDLDIRECT in the last 72 hours. Thyroid function studies No results for input(s): TSH, T4TOTAL, T3FREE, THYROIDAB in the last 72 hours.  Invalid input(s):  FREET3 Anemia work up No results for input(s): VITAMINB12, FOLATE, FERRITIN, TIBC, IRON, RETICCTPCT in the last 72 hours. Urinalysis    Component Value Date/Time   COLORURINE YELLOW 07/28/2015 0550   APPEARANCEUR TURBID* 07/28/2015 0550   LABSPEC 1.015 07/28/2015 0550   PHURINE 5.5 07/28/2015 0550   GLUCOSEU NEGATIVE 07/28/2015 0550   HGBUR SMALL* 07/28/2015 0550   BILIRUBINUR NEGATIVE 07/28/2015 0550   KETONESUR NEGATIVE 07/28/2015 0550   PROTEINUR NEGATIVE 07/28/2015 0550   UROBILINOGEN 1.0 11/29/2013 1705   NITRITE NEGATIVE 07/28/2015 0550   LEUKOCYTESUR MODERATE* 07/28/2015 0550   Sepsis Labs Invalid input(s): PROCALCITONIN,  WBC,  LACTICIDVEN Microbiology Recent Results (from the past 240 hour(s))  Blood culture (routine x 2)     Status: None   Collection Time: 08/15/15  1:02 AM  Result Value Ref Range Status   Specimen Description BLOOD RIGHT ARM  Final   Special Requests BOTTLES DRAWN AEROBIC AND ANAEROBIC 5ML  Final   Culture NO GROWTH 5 DAYS  Final  Report Status 08/20/2015 FINAL  Final  Blood culture (routine x 2)     Status: None   Collection Time: 08/15/15  1:07 AM  Result Value Ref Range Status   Specimen Description BLOOD RIGHT HAND  Final   Special Requests BOTTLES DRAWN AEROBIC AND ANAEROBIC 5ML  Final   Culture NO GROWTH 5 DAYS  Final   Report Status 08/20/2015 FINAL  Final  Surgical pcr screen     Status: None   Collection Time: 08/15/15  4:56 AM  Result Value Ref Range Status   MRSA, PCR NEGATIVE NEGATIVE Final   Staphylococcus aureus NEGATIVE NEGATIVE Final    Comment:        The Xpert SA Assay (FDA approved for NASAL specimens in patients over 32 years of age), is one component of a comprehensive surveillance program.  Test performance has been validated by [facility] for patients greater than or equal to 40 year old. It is not intended to diagnose infection nor to guide or monitor treatment.   Culture, blood (single)     Status: None  (Preliminary result)   Collection Time: 08/16/15 10:55 PM  Result Value Ref Range Status   Specimen Description BLOOD RIGHT ANTECUBITAL  Final   Special Requests BOTTLES DRAWN AEROBIC AND ANAEROBIC 5CC   Final   Culture NO GROWTH 4 DAYS  Final   Report Status PENDING  Incomplete   Time coordinating discharge: 32 minutes  SIGNED:  Irwin Brakeman, MD  [facility] 08/21/2015, 7:23 AM Pager   If 7PM-7AM, please contact night-coverage [email or web address] Password [email or web address]

## 2015-08-21 NOTE — Interval H&P Note (Signed)
Cory Weber was admitted today to Inpatient Rehabilitation with the diagnosis of left BKA.  The patient's history has been reviewed, patient examined, and there is no change in status.  Patient continues to be appropriate for intensive inpatient rehabilitation.  I have reviewed the patient's chart and labs.  Questions were answered to the patient's satisfaction. The PAPE has been reviewed and assessment remains appropriate.  Ankit Lorie Phenix 08/21/2015, 4:51 PM

## 2015-08-21 NOTE — PMR Pre-admission (Signed)
PMR Admission Coordinator Pre-Admission Assessment  Patient: Cory Weber is an 65 y.o., male MRN: [id number] DOB: Dec 05, 1950 Height: 6\' 1"  (185.4 cm) Weight: 90.3 kg (199 lb 1.2 oz)              Insurance Information HMO:     PPO:      PCP:      IPA:      80/20:      OTHER:  PRIMARY: Medicaid [place] Access      Policy#: [id number]      Subscriber: pt  Medicaid Application Date:       Case Manager:  Disability Application Date:       Case Worker:   Emergency Contact Information Contact Information    Name Relation Home Work Mobile   [place] Sister   [phone]   Bowell,Jackie Sister   [phone]     Current Medical History  Patient Admitting Diagnosis: left BKA  History of Present Illness: Cory Weber is a 65 y.o. right handed male with history of hypertension, tobacco abuse, diabetes mellitus, hepatitis C, CVA 2015 without residual maintained on aspirin and chronic renal insufficiency with creatinine 1.34 and peripheral vascular disease. Patient lives alone and was independent with assistive device prior to admission. Pesented 08/15/2015 after recent left popliteal angioplasty one week ago and was discharged to home 08/10/2015 ambulating 100 feet with minimal assistance and noted a progressive Pain with gangrenous changes to the left foot. Patient noted while changing dressing with findings of maggots in the wound. The leg was deemed nonsalvageable and underwent left BKA 08/16/2015 per Dr. Trula Slade. [facility] course pain management.Pre-prosthetic training with [employer] prosthetics. Mild leukocytosis 12,300 Improved to 10,700 as well as low-grade fever 100.6 and monitored with IV antibiotics completed 08/18/2015 and addition of Doxycycline 08/19/2015 . Acute blood loss anemia 8.7 and monitored. Subcutaneous heparin for DVT prophylaxis.   Past Medical History  Past Medical History  Diagnosis Date  . Hypertension   . Hyperlipidemia   . Asthma   . PVD (peripheral vascular  disease) ([place])   . Anemia   . Peripheral neuropathy ([place])   . Cellulitis and abscess of foot 07/2015  . Pneumonia X 1  . Type II diabetes mellitus ([place])   . Hepatitis C     states he was diagnosed in 2007 or 2007 while living in [place], [place]  . CVA (cerebral vascular accident) [facility]) 2015    denies residual on 08/15/2015    Family History  family history includes Breast cancer in his mother and sister; Cancer in his mother; Diabetes in his father and sister; Heart disease in his father; Heart failure in his mother; Hyperlipidemia in his father; Hypertension in his father and sister. There is no history of Colon cancer or Liver disease.  Prior Rehab/Hospitalizations:  Has the patient had major surgery during 100 days prior to admission? Yes  Current Medications   Current facility-administered medications:  .  acetaminophen (TYLENOL) tablet 650 mg, 650 mg, Oral, Q6H PRN, 650 mg at 08/16/15 2104 **OR** acetaminophen (TYLENOL) suppository 650 mg, 650 mg, Rectal, Q6H PRN, Toy Baker, MD .  acidophilus (RISAQUAD) capsule 2 capsule, 2 capsule, Oral, Daily, Albertine Patricia, MD, 2 capsule at 08/21/15 1025 .  aspirin EC tablet 81 mg, 81 mg, Oral, Daily, Albertine Patricia, MD, 81 mg at 08/21/15 1027 .  carvedilol (COREG) tablet 3.125 mg, 3.125 mg, Oral, BID WC, Toy Baker, MD, 3.125 mg at 08/21/15 1025 .  doxycycline (VIBRA-TABS) tablet 100 mg,  100 mg, Oral, Q12H, Clanford L Johnson, MD, 100 mg at 08/21/15 1025 .  famotidine (PEPCID) tablet 20 mg, 20 mg, Oral, Daily, Toy Baker, MD, 20 mg at 08/21/15 1025 .  feeding supplement (GLUCERNA SHAKE) (GLUCERNA SHAKE) liquid 237 mL, 237 mL, Oral, TID BM, Silver Huguenin Elgergawy, MD, 237 mL at 08/21/15 1031 .  feeding supplement (PRO-STAT SUGAR FREE 64) liquid 30 mL, 30 mL, Oral, Q1500, Albertine Patricia, MD, 30 mL at 08/20/15 1719 .  gabapentin (NEURONTIN) capsule 300 mg, 300 mg, Oral, BID, Toy Baker, MD, 300 mg at 08/21/15  1025 .  heparin injection 5,000 Units, 5,000 Units, Subcutaneous, Q8H, Albertine Patricia, MD, 5,000 Units at 08/21/15 0502 .  lisinopril (PRINIVIL,ZESTRIL) tablet 20 mg, 20 mg, Oral, BID, 20 mg at 08/21/15 1025 **AND** hydrochlorothiazide (MICROZIDE) capsule 12.5 mg, 12.5 mg, Oral, BID, Toy Baker, MD, 12.5 mg at 08/21/15 1025 .  insulin aspart (novoLOG) injection 0-9 Units, 0-9 Units, Subcutaneous, TID WC, Albertine Patricia, MD, 5 Units at 08/21/15 1155 .  insulin aspart (novoLOG) injection 3 Units, 3 Units, Subcutaneous, TID WC, Clanford Marisa Hua, MD, 3 Units at 08/21/15 1155 .  insulin glargine (LANTUS) injection 12 Units, 12 Units, Subcutaneous, Daily, Albertine Patricia, MD, 12 Units at 08/21/15 1026 .  morphine 2 MG/ML injection 1 mg, 1 mg, Intravenous, Q3H PRN, Toy Baker, MD, 1 mg at 08/18/15 0006 .  ondansetron (ZOFRAN) tablet 4 mg, 4 mg, Oral, Q6H PRN **OR** ondansetron (ZOFRAN) injection 4 mg, 4 mg, Intravenous, Q6H PRN, Toy Baker, MD, 4 mg at 08/16/15 0942 .  oxyCODONE-acetaminophen (PERCOCET/ROXICET) 5-325 MG per tablet 1-2 tablet, 1-2 tablet, Oral, Q4H PRN, Hulen Shouts Rhyne, PA-C, 2 tablet at 08/21/15 1025 .  polyethylene glycol (MIRALAX / GLYCOLAX) packet 17 g, 17 g, Oral, Daily, Clanford L Johnson, MD, 17 g at 08/21/15 1154 .  senna-docusate (Senokot-S) tablet 2 tablet, 2 tablet, Oral, BID, Clanford Marisa Hua, MD, 2 tablet at 08/21/15 1154 .  simvastatin (ZOCOR) tablet 20 mg, 20 mg, Oral, q1800, Albertine Patricia, MD, 20 mg at 08/20/15 1719 .  tamsulosin (FLOMAX) capsule 0.4 mg, 0.4 mg, Oral, Daily, Toy Baker, MD, 0.4 mg at 08/21/15 1025 .  thiamine (VITAMIN B-1) tablet 100 mg, 100 mg, Oral, Daily, Toy Baker, MD, 100 mg at 08/21/15 1025  Patients Current Diet: Diet Carb Modified Fluid consistency:: Thin; Room service appropriate?: Yes  Precautions / Restrictions Precautions Precautions: Fall Restrictions Weight Bearing Restrictions:  Yes LLE Weight Bearing: Non weight bearing   Has the patient had 2 or more falls or a fall with injury in the past year?No  Prior Activity Level Limited Community (1-2x/wk): does not drive due to no license. Mod I with RW pta and cane at times  Home Assistive Devices / [place] Devices/Equipment: Environmental consultant (specify type), Cane (specify quad or straight), Eyeglasses, CBG Meter, Dentures (specify type), Bedside commode/3-in-1, Shower chair with back, Grab bars in shower, Grab bars around toilet, Hand-held shower hose Home Equipment: Environmental consultant - 2 wheels, Bedside commode, Cane - single point, Shower seat - built in, [employer] - toilet, Grab bars - tub/shower  Prior Device Use: Indicate devices/aids used by the patient prior to current illness, exacerbation or injury? Walker  Prior Functional Level Prior Function Level of Independence: Independent with assistive device(s) Comments: using rw  Self Care: Did the patient need help bathing, dressing, using the toilet or eating?  Needed some help  Indoor Mobility: Did the patient need assistance with walking  from room to room (with or without device)? Independent  Stairs: Did the patient need assistance with internal or external stairs (with or without device)? Needed some help  Functional Cognition: Did the patient need help planning regular tasks such as shopping or remembering to take medications? Needed some help  Current Functional Level Cognition  Overall Cognitive Status: Within Functional Limits for tasks assessed Orientation Level: Oriented X4    Extremity Assessment (includes Sensation/Coordination)  Upper Extremity Assessment: Generalized weakness  Lower Extremity Assessment: Defer to PT evaluation LLE Deficits / Details: pt able to move LLE actively with bed mobility.     ADLs  Overall ADL's : Needs assistance/impaired Grooming: Wash/dry hands, Wash/dry face, Set up, Sitting, Min guard Upper Body Bathing:  Supervision/ safety, Set up, Sitting Lower Body Bathing: Moderate assistance Upper Body Dressing : Set up, Supervision/safety, Sitting Lower Body Dressing: Maximal assistance, Sitting/lateral leans Lower Body Dressing Details (indicate cue type and reason): pt educated on compensatory LB dressing techniques Toilet Transfer:  (Pt is mod A with transfers per PT) Toilet Transfer Details (indicate cue type and reason): Ubable to SPT to [facility]. Attempted sit - stand x 3 at RW from recliner before pt able to come to appropriate standing postion, requring cues to stand upright and use B UEs and cues for hand placement  Functional mobility during ADLs:  (Pt is mod A with transfers per PT) General ADL Comments: Pt completed bed mobility and SPT to recliner to simulate functional transfers. Pt reporting dizziness. VSS. Pt also reporting phantom limb pain/sensations. Discussed mirror box therapy .    Mobility  Overal bed mobility: Needs Assistance Bed Mobility: Supine to Sit Supine to sit: Min assist General bed mobility comments: Pt used bed rail and PTA for support to elevate trunk, min assist to scoot L hip forward.      Transfers  Overall transfer level: Needs assistance Equipment used: Rolling walker (2 wheeled) Transfers: Sit to/from Stand Sit to Stand: Min assist, Min guard (1st attempt min assist, 2nd attempt min guard.  ) Stand pivot transfers: Mod assist General transfer comment: Pt required cues for hand placement and increased assist to pivot to recliner chair.      Ambulation / Gait / Stairs / Wheelchair Mobility  Ambulation/Gait Ambulation/Gait assistance: Mod assist Ambulation Distance (Feet): 2 Feet Assistive device: Rolling walker (2 wheeled) Gait Pattern/deviations:  (Pt performed two shuffling hop to steps.  pt fearful to commit to technique to advance gait distance.  ) General Gait Details: as pt fatigued, pt safety awareness and compliance with cues for safety decreased. Having  to bring chair from behind and assist pt to sitting. Pt remains to self limit progression of gait distance.   Gait velocity: slow pattern    Posture / Balance Balance Overall balance assessment: Needs assistance Sitting-balance support: Feet supported, No upper extremity supported Sitting balance-Leahy Scale: Good Standing balance support: Bilateral upper extremity supported Standing balance-Leahy Scale: Poor Standing balance comment: rw and min guard/min A for balance    Special needs/care consideration Skin surgical incision Bowel mgmt: no BM in 7 days. Laxatives given 7/10 Bladder mgmt: urinal Diabetic mgmt Hgb A1c 7 6/17. Pt does not have a CBG meter at home to monitor his CBGs at this time Pt had Temp 100.6 at 2145 08/20/15. Temp 98.8 at 12 noon 08/21/15   Previous Home Environment Living Arrangements: Alone  Lives With: Alone Available Help at Discharge: Available PRN/intermittently Type of Home: Apartment Home Layout: One level Home Access: Level  entry Bathroom Shower/Tub: Tub/shower unit, Curtain (has handicapped bars around tub) Bathroom Toilet: Handicapped height Bathroom Accessibility: Yes How Accessible: Accessible via walker, Accessible via wheelchair (to the toilet , not all the way to tub with wheelchair) [place]: Yes Type of Home Care Services: Homehealth aide, Home RN (RN through [facility] aide 2 hrs per day 7 days per week thru) Additional Comments: pt reports that he thinks that he will need to go somewhere for more therapy and help before going back home.   Discharge Living Setting Plans for Discharge Living Setting: Patient's home, Apartment, Alone Type of Home at Discharge: Apartment Discharge Home Layout: One level Discharge Home Access: Level entry Discharge Bathroom Shower/Tub: Tub/shower unit, Curtain (with handicapped bars around tub) Discharge Bathroom Toilet: Handicapped height Discharge Bathroom Accessibility: Yes How Accessible:  Accessible via wheelchair, Accessible via walker (to the tub but not all the way to the tub in wheelchair) Does the patient have any problems obtaining your medications?:  (gets meds through [place])  [place] Patient Roles: Parent (Has adult daughter in [place]) Sport and exercise psychologist Information: sisters, Kenney Houseman and [place] locally Anticipated Caregiver: sisters and PCS aide prn only Anticipated Ambulance person Information: see above Ability/Limitations of Caregiver: intermittent only Caregiver Availability: Intermittent Discharge Plan Discussed with Primary Caregiver: Yes Is Caregiver In Agreement with Plan?: Yes Does Caregiver/Family have Issues with Lodging/Transportation while Pt is in Rehab?: No Pt has [employer] aide 2 hrs per day 7 days per week. He is hopefulo her hrs can be increased some at d/c.Pt used [facility] pts, but likely will request change for RN was doing daily dressing changes, but pt had family member change his dressing on the day of admit and found Maggots in the wound and pt then sought admit to ER.  Goals/Additional Needs Patient/Family Goal for Rehab: Mod I PT and OT at wheelchair level Expected length of stay: ELOS 8- 12 days Equipment Needs: Pt does not have a CBG meter at home; needs a replacement Pt/Family Agrees to Admission and willing to participate: Yes Program Orientation Provided & Reviewed with Pt/Caregiver Including Roles  & Responsibilities: Yes  Decrease burden of Care through IP rehab admission: n/a  Possible need for SNF placement upon discharge: not anticipated  Patient Condition: This patient's medical and functional status has changed since the consult dated: 08/18/2015 in which the Rehabilitation Physician determined and documented that the patient's condition is appropriate for intensive rehabilitative care in an inpatient rehabilitation facility. See "History of Present Illness" (above) for medical update. Functional  changes are: overall min to mod assist. Patient's medical and functional status update has been discussed with the Rehabilitation physician and patient remains appropriate for inpatient rehabilitation. Will admit to inpatient rehab today.  Preadmission Screen Completed By:  Cleatrice Burke, 08/21/2015 1:08 PM ______________________________________________________________________   Discussed status with Dr. Posey Pronto on 08/21/2015 at 1307 and received telephone approval for admission today.  Admission Coordinator:  Cleatrice Burke, time [id number] Date 08/21/2015

## 2015-08-21 NOTE — Progress Notes (Signed)
Ankit Lorie Phenix, MD Physician Signed Physical Medicine and Rehabilitation Consult Note 08/18/2015 5:54 AM  Related encounter: ED to Hosp-Admission (Discharged) from 08/14/2015 in [place] Collapse All        Physical Medicine and Rehabilitation Consult Reason for Consult: Left BKA Referring Physician: [facility]   HPI: Cory Weber is a 65 y.o. right handed male with history of hypertension, tobacco abuse, diabetes mellitus, hepatitis C, CVA 2015 without residual maintained on aspirin and chronic renal insufficiency with creatinine 1.34 and peripheral vascular disease. Patient lives alone and was independent with assistive device prior to admission. He has a personal care attendant 3 hours a day 7 days a week to help with meals groceries and cleaning. 1 level apartment steps to entry. Presented 08/15/2015 after recent left popliteal angioplasty one week ago and was discharged to home 08/10/2015 ambulating 100 feet with minimal assistance and noted a progressive pain with gangrenous changes to the left foot. Patient findings of maggots in the wound while changing. The leg was deemed nonsalvageable and underwent left BKA 08/16/2015 per Dr. Trula Slade. [facility] course pain management. Mild leukocytosis 12,300 as well as low-grade fever and monitored. Acute blood loss anemia 8.8 and monitored. Subcutaneous heparin for DVT prophylaxis. Physical therapy evaluation completed with recommendations of physical medicine rehabilitation consult.   Review of Systems  Constitutional: Negative for fever and chills.  HENT: Negative for hearing loss.  Eyes: Negative for blurred vision and double vision.  Respiratory: Positive for shortness of breath. Negative for cough.  Cardiovascular: Positive for leg swelling. Negative for chest pain and palpitations.  Gastrointestinal: Positive for constipation. Negative for nausea and vomiting.  Genitourinary: Positive for  urgency. Negative for dysuria and hematuria.  Musculoskeletal: Positive for myalgias.  Skin: Negative for rash.  Neurological: Positive for weakness. Negative for seizures, loss of consciousness and headaches.  All other systems reviewed and are negative.  Past Medical History  Diagnosis Date  . Hypertension   . Hyperlipidemia   . Asthma   . PVD (peripheral vascular disease) ([place])   . Anemia   . Peripheral neuropathy ([place])   . Cellulitis and abscess of foot 07/2015  . Pneumonia X 1  . Type II diabetes mellitus ([place])   . Hepatitis C     states he was diagnosed in 2007 or 2007 while living in [place], [place]  . CVA (cerebral vascular accident) [facility]) 2015    denies residual on 08/15/2015   Past Surgical History  Procedure Laterality Date  . Liver biopsy  2005    Done in [place], [place]. Chronic hepatitis with mild periportal inflammation, lobular unicellular necrosis and portal fibrosis. Grade 2, stage 1-2.  Marland Kitchen Colonoscopy with propofol N/A 09/03/2012    [id number] polyp-removed as outlined above. Prominent internal hemorrhoids. Tubular adenoma  . Esophagogastroduodenoscopy (egd) with propofol N/A 09/03/2012    [id number] hernia. Gastric diverticulum. Gastric ulcers with associated erosions. Duodenal erosions. Status post gastric biopsy. H.PYLORI gastritis   . Biopsy N/A 09/03/2012    Procedure: BIOPSY; Surgeon: Daneil Dolin, MD; Location: [facility]; Service: Endoscopy; Laterality: N/A; gastric and gastric mucosa  . Polypectomy N/A 09/03/2012    Procedure: POLYPECTOMY; Surgeon: Daneil Dolin, MD; Location: [facility]; Service: Endoscopy; Laterality: N/A; cecal polyp  . Esophagogastroduodenoscopy (egd) with propofol N/A 12/03/2012    Dr. Gala Romney: gastric diverticulum, gastric erosions and scar. Previously noted gastric ulcer completed healed. Biopsy without H.pylori.   . Biopsy N/A 12/03/2012  Procedure: BIOPSY; Surgeon: Daneil Dolin, MD; Location: [facility]; Service: Endoscopy; Laterality: N/A;  . Maximum access (mas)posterior lumbar interbody fusion (plif) 1 level N/A 11/25/2013    Procedure: FOR MAXIMUM ACCESS (MAS) POSTERIOR LUMBAR INTERBODY FUSION (PLIF) 1 LEVEL; Surgeon: Eustace Moore, MD; Location: [facility]; Service: Neurosurgery; Laterality: N/A; FOR MAXIMUM ACCESS (MAS) POSTERIOR LUMBAR INTERBODY FUSION (PLIF) 1 LEVEL LUMBAR 3-4  . Peripheral vascular catheterization Left 08/01/2015    Procedure: Lower Extremity Angiography; Surgeon: Serafina Mitchell, MD; Location: [facility]; Service: Cardiovascular; Laterality: Left;  . Peripheral vascular catheterization N/A 08/01/2015    Procedure: Abdominal Aortogram; Surgeon: Serafina Mitchell, MD; Location: [facility]; Service: Cardiovascular; Laterality: N/A;  . Peripheral vascular catheterization N/A 08/08/2015    Procedure: Abdominal Aortogram w/Lower Extremity; Surgeon: Serafina Mitchell, MD; Location: [facility]; Service: Cardiovascular; Laterality: N/A;  . Peripheral vascular catheterization Left 08/08/2015    Procedure: Peripheral Vascular Balloon Angioplasty; Surgeon: Serafina Mitchell, MD; Location: [facility]; Service: Cardiovascular; Laterality: Left; left popiteal artery, left peronealtrunk, left post tibial  . Tonsillectomy    . Back surgery    . Amputation Left 08/16/2015    Procedure: LEFT BELOW THE KNEE AMPUTATION ; Surgeon: Serafina Mitchell, MD; Location[facility]; Service: Vascular; Laterality: Left;   Family History  Problem Relation Age of Onset  . Breast cancer Mother     deceased  . Cancer Mother   . Diabetes Father   . Hypertension Father   . Heart disease Father     deceased  . Hyperlipidemia Father   . Diabetes Sister   . Hypertension Sister   . Breast cancer  Sister   . Colon cancer Neg Hx   . Liver disease Neg Hx   . Heart failure Mother    Social History:  reports that he quit smoking about 5 weeks ago. His smoking use included Cigarettes. He has a 47 pack-year smoking history. He has never used smokeless tobacco. He reports that he drinks alcohol. He reports that he uses illicit drugs (Cocaine). Allergies: No Known Allergies Medications Prior to Admission  Medication Sig Dispense Refill  . aspirin EC 81 MG tablet Take 1 tablet (81 mg total) by mouth daily. 100 tablet 3  . carvedilol (COREG) 3.125 MG tablet Take 1 tablet (3.125 mg total) by mouth 2 (two) times daily with a meal. 60 tablet 0  . famotidine (PEPCID) 20 MG tablet Take 1 tablet (20 mg total) by mouth daily. 30 tablet 0  . folic acid (FOLVITE) 1 MG tablet Take 1 tablet (1 mg total) by mouth daily. 30 tablet 0  . gabapentin (NEURONTIN) 300 MG capsule Take 1 capsule (300 mg total) by mouth 2 (two) times daily. 60 capsule 1  . glipiZIDE (GLUCOTROL) 5 MG tablet Take 5 mg by mouth 2 (two) times daily before a meal.     . lisinopril-hydrochlorothiazide (PRINZIDE,ZESTORETIC) 20-12.5 MG per tablet Take 1 tablet by mouth 2 (two) times daily. 30 tablet 1  . metFORMIN (GLUCOPHAGE) 1000 MG tablet Take 1 tablet (1,000 mg total) by mouth 2 (two) times daily with a meal. 60 tablet 1  . Multiple Vitamin (MULTIVITAMIN WITH MINERALS) TABS tablet Take 1 tablet by mouth daily. 30 tablet 0  . oxyCODONE-acetaminophen (PERCOCET/ROXICET) 5-325 MG tablet Take 2 tablets by mouth every 6 (six) hours as needed for severe pain. 20 tablet 0  . silver sulfADIAZINE (SILVADENE) 1 % cream Apply topically daily. (Patient taking  differently: Apply 1 application topically daily. ) 50 g 0  . tamsulosin (FLOMAX) 0.4 MG CAPS capsule Take 1 capsule (0.4 mg total) by mouth daily. 30 capsule 0  . thiamine 100 MG tablet Take 1 tablet (100 mg total) by  mouth daily. 30 tablet 0    Home: Home Living Family/patient expects to be discharged to:: Private residence Living Arrangements: Alone  Functional History:   Functional Status:  Mobility:          ADL:    Cognition: Cognition Orientation Level: Oriented X4    Blood pressure 128/85, pulse 102, temperature 100.4 F (38 C), temperature source Oral, resp. rate 16, height 6\' 1"  (1.854 m), weight 90.3 kg (199 lb 1.2 oz), SpO2 98 %. Physical Exam  Vitals reviewed. Constitutional: He is oriented to person, place, and time. He appears well-developed and well-nourished.  HENT:  Head: Normocephalic and atraumatic.  Eyes: Right eye exhibits no discharge. Left eye exhibits no discharge.  Pupils reactive to light  Neck: Normal range of motion. Neck supple. No thyromegaly present.  Cardiovascular: Normal rate and regular rhythm.  Respiratory: Effort normal and breath sounds normal. No respiratory distress.  GI: Soft. Bowel sounds are normal. He exhibits no distension.  Musculoskeletal: He exhibits edema and tenderness.  Neurological: He is alert and oriented to person, place, and time.  Motor: B/l UE, RLE: 5/5 proximal to distal LLE: 0/5 (unwilling to move, stating, he can't)  Skin: Skin is warm and dry.  Amputation site is dressed (c/d/i) appropriately tender  Psychiatric: He has a normal mood and affect. His behavior is normal.     Lab Results Last 24 Hours    Results for orders placed or performed during the hospital encounter of 08/14/15 (from the past 24 hour(s))  Glucose, capillary Status: Abnormal   Collection Time: 08/17/15 6:26 AM  Result Value Ref Range   Glucose-Capillary 197 (H) 65 - 99 mg/dL  Prepare RBC Status: None   Collection Time: 08/17/15 10:00 AM  Result Value Ref Range   Order Confirmation ORDER PROCESSED BY BLOOD BANK   Type and screen [place] Status: None (Preliminary result)    Collection Time: 08/17/15 10:00 AM  Result Value Ref Range   ABO/RH(D) O POS    Antibody Screen NEG    Sample Expiration 08/20/2015    Unit Number [id number]    Blood Component Type RBC LR PHER2    Unit division 00    Status of Unit ISSUED    Transfusion Status OK TO TRANSFUSE    Crossmatch Result Compatible   Glucose, capillary Status: Abnormal   Collection Time: 08/17/15 11:40 AM  Result Value Ref Range   Glucose-Capillary 283 (H) 65 - 99 mg/dL  Glucose, capillary Status: Abnormal   Collection Time: 08/17/15 5:11 PM  Result Value Ref Range   Glucose-Capillary 235 (H) 65 - 99 mg/dL  Glucose, capillary Status: Abnormal   Collection Time: 08/17/15 9:49 PM  Result Value Ref Range   Glucose-Capillary 147 (H) 65 - 99 mg/dL   Comment 1 Notify RN       Imaging Results (Last 48 hours)    No results found.    Assessment/Plan: Diagnosis: Left BKA Labs and images independently reviewed. Records reviewed and summated above. Clean amputation daily with soap and water Monitor incision site for signs of infection or impending skin breakdown. Staples to remain in place for 3-4 weeks Stump shrinker, for edema control  Scar mobilization massaging to prevent soft tissue adherence  Stump protector during therapies Prevent flexion contractures by implementing the following:  Encourage prone lying for 20-30 mins per day BID to avoid hip flexion Contractures if medically appropriate; Avoid pillow under knees when patient is lying in bed in order to prevent both knee and hip flexion contractures; Avoid prolonged sitting Post surgical pain control with oral medication Phantom limb pain control with physical modalities including desensitization techniques (gentle self massage to the residual stump,hot packs if sensation iintact, [place]) and mirror  therapy, TENS. If ineffective, consider pharmacological treatment for neuropathic pain (e.g gabapentin, pregabalin, amytriptalyine, duloxetine).  When using wheelchair, patient should have knee on amputated side fully extended with board under the seat cushion. Avoid injury to contralateral side  1. Does the need for close, 24 hr/day medical supervision in concert with the patient's rehab needs make it unreasonable for this patient to be served in a less intensive setting? Yes  2. Co-Morbidities requiring supervision/potential complications: HTN (monitor and provide prns in accordance with increased physical exertion and pain), tobacco abuse (counsel), diabetes mellitus (Monitor in accordance with exercise and adjust meds as necessary), hepatitis C (avoid hepatotoxic meds), CVA 2015 without residual deficits (cont meds), chronic renal insufficiency (avoid nephrotoxic meds) peripheral vascular disease (cont meds), leukocytosis (cont to monitor for signs and symptoms of infection, further workup if indicated), low-grade fever (cont to monitor), Acute blood loss anemia (transfuse if necessary to ensure appropriate perfusion for increased activity tolerance), Tachycardia (monitor in accordance with pain and increasing activity), hyponatremia (cont to monitor, treat if necessary), SIRS (see above) 3. Due to safety, skin/wound care, disease management, pain management and patient education, does the patient require 24 hr/day rehab nursing? Yes 4. Does the patient require coordinated care of a physician, rehab nurse, PT (1-2 hrs/day, 5 days/week) and OT (1-2 hrs/day, 5 days/week) to address physical and functional deficits in the context of the above medical diagnosis(es)? Yes Addressing deficits in the following areas: balance, endurance, locomotion, strength, transferring, bathing, dressing, toileting and psychosocial support 5. Can the patient actively participate in an intensive therapy program of at least  3 hrs of therapy per day at least 5 days per week? Yes 6. The potential for patient to make measurable gains while on inpatient rehab is excellent 7. Anticipated functional outcomes upon discharge from inpatient rehab are Mod I at wheelchair level with PT, Mod I at wheelchair level with OT, n/a with SLP. 8. Estimated rehab length of stay to reach the above functional goals is: 8-12 days. 9. Does the patient have adequate social supports and living environment to accommodate these discharge functional goals? Yes 10. Anticipated D/C setting: Home 11. Anticipated post D/C treatments: HH therapy and Home excercise program 12. Overall Rehab/Functional Prognosis: good  RECOMMENDATIONS: This patient's condition is appropriate for continued rehabilitative care in the following setting: CIR once medically stable and afebrile Patient has agreed to participate in recommended program. Yes Note that insurance prior authorization may be required for reimbursement for recommended care.  Comment: Rehab Admissions Coordinator to follow up.  Delice Lesch, MD 08/18/2015       Revision History     Date/Time User Provider Type Action   08/18/2015 3:16 PM Ankit Lorie Phenix, MD Physician Sign   08/18/2015 2:03 PM Cathlyn Parsons, PA-C Physician Assistant Pend   View Details Report       Routing History     Date/Time From To Method   08/18/2015 3:16 PM Ankit Lorie Phenix, MD Iona Beard, MD Fax

## 2015-08-21 NOTE — Progress Notes (Signed)
I discussed with Dr. Delice Lesch that pt had Temp 100.6 last pm. We will admit to inpt rehab today. I met with pt at bedside and he is in agreement to admit. Pt without BM for 7 days, but RN has received orders for laxatives. [phone]

## 2015-08-21 NOTE — H&P (View-Only) (Signed)
Physical Medicine and Rehabilitation Admission H&P    Chief Complaint  Patient presents with  . Wound Infection  : HPI: Cory Weber is a 65 y.o. right handed male with history of hypertension, tobacco abuse, diabetes mellitus, hepatitis C, CVA 2015 without residual maintained on aspirin and chronic renal insufficiency with creatinine 1.34 and peripheral vascular disease. Patient lives alone and was independent with assistive device prior to admission. He has a personal care attendant 3 hours a day 7 days a week to help with meals groceries and cleaning. 1 level apartment with no  steps  entry Presented 08/15/2015 after recent left popliteal angioplasty one week ago and was discharged to home 08/10/2015 ambulating 100 feet with minimal assistance and noted a progressive Pain with gangrenous changes to the left foot. Patient noted while changing dressing with findings of maggots in the wound. The leg was deemed nonsalvageable and underwent left BKA 08/16/2015 per Dr. Trula Slade. [facility] course pain management.Pre-prosthetic training with [employer] prosthetics. Mild leukocytosis 12,300 Improved to 10,700 as well as low-grade fever 100.6 and monitored with IV antibiotics completed 08/18/2015 and addition of Doxycycline 08/19/2015 . Acute blood loss anemia 8.7 and monitored. Subcutaneous heparin for DVT prophylaxis. PhysicalAnd occupational therapy evaluations completed with recommendations of physical medicine rehabilitation consult.Patient was admitted for comprehensive rehabilitation program  ROS Constitutional: Negative for fever and chills.  HENT: Negative for hearing loss.  Eyes: Negative for blurred vision and double vision.  Respiratory: Negative for cough.  Cardiovascular: Positive for leg swelling. Negative for chest pain and palpitations.  Gastrointestinal: Positive for constipation. Negative for nausea and vomiting.  Genitourinary: Positive for urgency. Negative for dysuria and  hematuria.  Musculoskeletal: Positive for myalgias and joint pain.  Skin: Negative for rash.  Neurological: Positive for weakness. Negative for seizures, loss of consciousness and headaches.  All other systems reviewed and are negative   Past Medical History  Diagnosis Date  . Hypertension   . Hyperlipidemia   . Asthma   . PVD (peripheral vascular disease) ([place])   . Anemia   . Peripheral neuropathy ([place])   . Cellulitis and abscess of foot 07/2015  . Pneumonia X 1  . Type II diabetes mellitus ([place])   . Hepatitis C     states he was diagnosed in 2007 or 2007 while living in [place], [place]  . CVA (cerebral vascular accident) [facility]) 2015    denies residual on 08/15/2015   Past Surgical History  Procedure Laterality Date  . Liver biopsy  2005    Done in [place], [place]. Chronic hepatitis with mild periportal inflammation, lobular unicellular necrosis and portal fibrosis. Grade 2, stage 1-2.  [facility] Kitchen Colonoscopy with propofol N/A 09/03/2012    XLK:GMWNUUV polyp-removed as outlined above. Prominent internal hemorrhoids. Tubular adenoma  . Esophagogastroduodenoscopy (egd) with propofol N/A 09/03/2012    OZD:GUYQIH hernia. Gastric diverticulum. Gastric ulcers with associated erosions. Duodenal erosions. Status post gastric biopsy. H.PYLORI gastritis   . Biopsy N/A 09/03/2012    Procedure: BIOPSY;  Surgeon: Daneil Dolin, MD;  Location: [facility];  Service: Endoscopy;  Laterality: N/A;  gastric and gastric mucosa  . Polypectomy N/A 09/03/2012    Procedure: POLYPECTOMY;  Surgeon: Daneil Dolin, MD;  Location: [facility];  Service: Endoscopy;  Laterality: N/A;  cecal polyp  . Esophagogastroduodenoscopy (egd) with propofol N/A 12/03/2012    Dr. Gala Romney: gastric diverticulum, gastric erosions and scar. Previously noted gastric ulcer completed healed. Biopsy without H.pylori.   . Biopsy N/A 12/03/2012  Procedure: BIOPSY;  Surgeon: Daneil Dolin, MD;  Location: [facility];  Service: Endoscopy;  Laterality: N/A;  .  Maximum access (mas)posterior lumbar interbody fusion (plif) 1 level N/A 11/25/2013    Procedure: FOR MAXIMUM ACCESS (MAS) POSTERIOR LUMBAR INTERBODY FUSION (PLIF) 1 LEVEL;  Surgeon: Eustace Moore, MD;  Location: [facility] NEURO ORS;  Service: Neurosurgery;  Laterality: N/A;  FOR MAXIMUM ACCESS (MAS) POSTERIOR LUMBAR INTERBODY FUSION (PLIF) 1 LEVEL LUMBAR 3-4  . Peripheral vascular catheterization Left 08/01/2015    Procedure: Lower Extremity Angiography;  Surgeon: Serafina Mitchell, MD;  Location: [facility] CV LAB;  Service: Cardiovascular;  Laterality: Left;  . Peripheral vascular catheterization N/A 08/01/2015    Procedure: Abdominal Aortogram;  Surgeon: Serafina Mitchell, MD;  Location: [facility] CV LAB;  Service: Cardiovascular;  Laterality: N/A;  . Peripheral vascular catheterization N/A 08/08/2015    Procedure: Abdominal Aortogram w/Lower Extremity;  Surgeon: Serafina Mitchell, MD;  Location: [facility] CV LAB;  Service: Cardiovascular;  Laterality: N/A;  . Peripheral vascular catheterization Left 08/08/2015    Procedure: Peripheral Vascular Balloon Angioplasty;  Surgeon: Serafina Mitchell, MD;  Location: [facility] CV LAB;  Service: Cardiovascular;  Laterality: Left;  left popiteal artery, left peronealtrunk, left post tibial  . Tonsillectomy    . Back surgery    . Amputation Left 08/16/2015    Procedure: LEFT BELOW THE KNEE AMPUTATION ;  Surgeon: Serafina Mitchell, MD;  Location: [facility] OR;  Service: Vascular;  Laterality: Left;   Family History  Problem Relation Age of Onset  . Breast cancer Mother     deceased  . Cancer Mother   . Diabetes Father   . Hypertension Father   . Heart disease Father     deceased  . Hyperlipidemia Father   . Diabetes Sister   . Hypertension Sister   . Breast cancer Sister   . Colon cancer Neg Hx   . Liver disease Neg Hx   . Heart failure Mother    Social History:  reports that he quit smoking about 5 weeks ago. His smoking use included Cigarettes. He has a 47  pack-year smoking history. He has never used smokeless tobacco. He reports that he drinks alcohol. He reports that he uses illicit drugs (Cocaine). Allergies: No Known Allergies Medications Prior to Admission  Medication Sig Dispense Refill  . aspirin EC 81 MG tablet Take 1 tablet (81 mg total) by mouth daily. 100 tablet 3  . carvedilol (COREG) 3.125 MG tablet Take 1 tablet (3.125 mg total) by mouth 2 (two) times daily with a meal. 60 tablet 0  . famotidine (PEPCID) 20 MG tablet Take 1 tablet (20 mg total) by mouth daily. 30 tablet 0  . folic acid (FOLVITE) 1 MG tablet Take 1 tablet (1 mg total) by mouth daily. 30 tablet 0  . gabapentin (NEURONTIN) 300 MG capsule Take 1 capsule (300 mg total) by mouth 2 (two) times daily. 60 capsule 1  . glipiZIDE (GLUCOTROL) 5 MG tablet Take 5 mg by mouth 2 (two) times daily before a meal.     . lisinopril-hydrochlorothiazide (PRINZIDE,ZESTORETIC) 20-12.5 MG per tablet Take 1 tablet by mouth 2 (two) times daily. 30 tablet 1  . metFORMIN (GLUCOPHAGE) 1000 MG tablet Take 1 tablet (1,000 mg total) by mouth 2 (two) times daily with a meal. 60 tablet 1  . Multiple Vitamin (MULTIVITAMIN WITH MINERALS) TABS tablet Take 1 tablet by mouth daily. 30 tablet 0  . oxyCODONE-acetaminophen (PERCOCET/ROXICET) 5-325 MG  tablet Take 2 tablets by mouth every 6 (six) hours as needed for severe pain. 20 tablet 0  . silver sulfADIAZINE (SILVADENE) 1 % cream Apply topically daily. (Patient taking differently: Apply 1 application topically daily. ) 50 g 0  . tamsulosin (FLOMAX) 0.4 MG CAPS capsule Take 1 capsule (0.4 mg total) by mouth daily. 30 capsule 0  . thiamine 100 MG tablet Take 1 tablet (100 mg total) by mouth daily. 30 tablet 0    Home: Home Living Family/patient expects to be discharged to:: Skilled nursing facility Living Arrangements: Alone Available Help at Discharge: [place] Type of Home: Apartment Home Access: Level entry Home Layout: One  level Bathroom Shower/Tub: Multimedia programmer: Handicapped height Home Equipment: Environmental consultant - 2 wheels, Bedside commode, Cane - single point, Shower seat - built in, [employer] - toilet, Grab bars - tub/shower Additional Comments: pt reports that he thinks that he will need to go somewhere for more therapy and help before going back home.    Functional History: Prior Function Level of Independence: Independent with assistive device(s) Comments: using rw  Functional Status:  Mobility: Bed Mobility Overal bed mobility: Needs Assistance Bed Mobility: Supine to Sit Supine to sit: Supervision General bed mobility comments: pt up in recliner Transfers Overall transfer level: Needs assistance Equipment used: Rolling walker (2 wheeled) Transfers: Sit to/from Stand Sit to Stand: Mod assist, From elevated surface General transfer comment: assist needed to come to standing as well as cues for hand placement and stabolization of rw. When sitting pt releasing rw and reaching for chair prior to turning, pt not following commands related to safety.  Ambulation/Gait Ambulation/Gait assistance: Min assist Ambulation Distance (Feet): 7 Feet Assistive device: Rolling walker (2 wheeled) Gait Pattern/deviations:  (hop-to pattern) General Gait Details: as pt fatigued, pt safety awareness and compliance with cues for safety decreased. Having to bring chair from behind and assist pt to sitting.  Gait velocity: slow pattern    ADL:    Cognition: Cognition Overall Cognitive Status: Within Functional Limits for tasks assessed Orientation Level: Oriented X4 Cognition Arousal/Alertness: Awake/alert Behavior During Therapy: Impulsive Overall Cognitive Status: Within Functional Limits for tasks assessed  Physical Exam: Blood pressure 150/89, pulse 51, temperature 99.8 F (37.7 C), temperature source Oral, resp. rate 16, height _0  (1.854 m), weight 90.3 kg (199 lb 1.2 oz), SpO2 99  %. Physical Exam Constitutional: He is oriented to person, place, and time. He appears well-developed and well-nourished.  HENT:  Head: Normocephalic and atraumatic.  Eyes: Right eye exhibits no discharge. Left eye exhibits no discharge.  Pupils reactive to light  Neck: Normal range of motion. Neck supple. No thyromegaly present.  Cardiovascular: Normal rate and regular rhythm.  Respiratory: Effort normal and breath sounds normal. No respiratory distress.  GI: Soft. Bowel sounds are normal. He exhibits no distension.  Musculoskeletal: He exhibits edema and tenderness.  Neurological: He is alert and oriented to person, place, and time.  Motor: B/l UE, RLE: 5/5 proximal to distal LLE: 3-/5  (pain inhibition) Skin: Skin is warm and dry.  Amputation site is dressed (c/d/i) appropriately tender  Psychiatric: He has a normal mood and affect. His behavior is normal.   Results for orders placed or performed during the hospital encounter of 08/14/15 (from the past 48 hour(s))  CBC     Status: Abnormal   Collection Time: 08/16/15  4:14 PM  Result Value Ref Range   WBC 11.0 (H) 4.0 - 10.5 K/uL  RBC 2.94 (L) 4.22 - 5.81 MIL/uL   Hemoglobin 8.0 (L) 13.0 - 17.0 g/dL   HCT 26.0 (L) 39.0 - 52.0 %   MCV 88.4 78.0 - 100.0 fL   MCH 27.2 26.0 - 34.0 pg   MCHC 30.8 30.0 - 36.0 g/dL   RDW 14.4 11.5 - 15.5 %   Platelets 365 150 - 400 K/uL  Glucose, capillary     Status: Abnormal   Collection Time: 08/16/15  4:30 PM  Result Value Ref Range   Glucose-Capillary 267 (H) 65 - 99 mg/dL  Glucose, capillary     Status: Abnormal   Collection Time: 08/16/15 10:01 PM  Result Value Ref Range   Glucose-Capillary 164 (H) 65 - 99 mg/dL  Culture, blood (single)     Status: None (Preliminary result)   Collection Time: 08/16/15 10:55 PM  Result Value Ref Range   Specimen Description BLOOD RIGHT ANTECUBITAL    Special Requests BOTTLES DRAWN AEROBIC AND ANAEROBIC 5CC     Culture NO GROWTH < 24 HOURS    Report  Status PENDING   Glucose, capillary     Status: Abnormal   Collection Time: 08/17/15  1:07 AM  Result Value Ref Range   Glucose-Capillary 207 (H) 65 - 99 mg/dL  CBC     Status: Abnormal   Collection Time: 08/17/15  3:50 AM  Result Value Ref Range   WBC 10.1 4.0 - 10.5 K/uL   RBC 2.67 (L) 4.22 - 5.81 MIL/uL   Hemoglobin 7.3 (L) 13.0 - 17.0 g/dL   HCT 23.4 (L) 39.0 - 52.0 %   MCV 87.6 78.0 - 100.0 fL   MCH 27.3 26.0 - 34.0 pg   MCHC 31.2 30.0 - 36.0 g/dL   RDW 14.4 11.5 - 15.5 %   Platelets 346 150 - 400 K/uL  Basic metabolic panel     Status: Abnormal   Collection Time: 08/17/15  3:50 AM  Result Value Ref Range   Sodium 132 (L) 135 - 145 mmol/L   Potassium 4.6 3.5 - 5.1 mmol/L    Comment: DELTA CHECK NOTED NO VISIBLE HEMOLYSIS    Chloride 96 (L) 101 - 111 mmol/L   CO2 28 22 - 32 mmol/L   Glucose, Bld 251 (H) 65 - 99 mg/dL   BUN 11 6 - 20 mg/dL   Creatinine, Ser 1.18 0.61 - 1.24 mg/dL   Calcium 9.1 8.9 - 10.3 mg/dL   GFR calc non Af Amer >60 >60 mL/min   GFR calc Af Amer >60 >60 mL/min    Comment: (NOTE) The eGFR has been calculated using the CKD EPI equation. This calculation has not been validated in all clinical situations. eGFR's persistently <60 mL/min signify possible Chronic Kidney Disease.    Anion gap 8 5 - 15  Glucose, capillary     Status: Abnormal   Collection Time: 08/17/15  6:26 AM  Result Value Ref Range   Glucose-Capillary 197 (H) 65 - 99 mg/dL  Prepare RBC     Status: None   Collection Time: 08/17/15 10:00 AM  Result Value Ref Range   Order Confirmation ORDER PROCESSED BY BLOOD BANK   Type and screen [place]     Status: None   Collection Time: 08/17/15 10:00 AM  Result Value Ref Range   ABO/RH(D) O POS    Antibody Screen NEG    Sample Expiration 08/20/2015    Unit Number [id number]    Blood Component Type RBC LR PHER2  Unit division 00    Status of Unit ISSUED,FINAL    Transfusion Status OK TO TRANSFUSE    Crossmatch  Result Compatible   Glucose, capillary     Status: Abnormal   Collection Time: 08/17/15 11:40 AM  Result Value Ref Range   Glucose-Capillary 283 (H) 65 - 99 mg/dL  Glucose, capillary     Status: Abnormal   Collection Time: 08/17/15  5:11 PM  Result Value Ref Range   Glucose-Capillary 235 (H) 65 - 99 mg/dL  Glucose, capillary     Status: Abnormal   Collection Time: 08/17/15  9:49 PM  Result Value Ref Range   Glucose-Capillary 147 (H) 65 - 99 mg/dL   Comment 1 Notify RN   CBC     Status: Abnormal   Collection Time: 08/18/15  6:28 AM  Result Value Ref Range   WBC 12.3 (H) 4.0 - 10.5 K/uL   RBC 3.21 (L) 4.22 - 5.81 MIL/uL   Hemoglobin 8.8 (L) 13.0 - 17.0 g/dL   HCT 27.7 (L) 39.0 - 52.0 %   MCV 86.3 78.0 - 100.0 fL   MCH 27.4 26.0 - 34.0 pg   MCHC 31.8 30.0 - 36.0 g/dL   RDW 14.3 11.5 - 15.5 %   Platelets 355 150 - 400 K/uL  Basic metabolic panel     Status: Abnormal   Collection Time: 08/18/15  6:28 AM  Result Value Ref Range   Sodium 132 (L) 135 - 145 mmol/L   Potassium 3.9 3.5 - 5.1 mmol/L   Chloride 96 (L) 101 - 111 mmol/L   CO2 29 22 - 32 mmol/L   Glucose, Bld 181 (H) 65 - 99 mg/dL   BUN 12 6 - 20 mg/dL   Creatinine, Ser 1.08 0.61 - 1.24 mg/dL   Calcium 9.4 8.9 - 10.3 mg/dL   GFR calc non Af Amer >60 >60 mL/min   GFR calc Af Amer >60 >60 mL/min    Comment: (NOTE) The eGFR has been calculated using the CKD EPI equation. This calculation has not been validated in all clinical situations. eGFR's persistently <60 mL/min signify possible Chronic Kidney Disease.    Anion gap 7 5 - 15  Glucose, capillary     Status: Abnormal   Collection Time: 08/18/15  6:52 AM  Result Value Ref Range   Glucose-Capillary 200 (H) 65 - 99 mg/dL  Glucose, capillary     Status: Abnormal   Collection Time: 08/18/15  1:12 PM  Result Value Ref Range   Glucose-Capillary 210 (H) 65 - 99 mg/dL   No results found.   Medical Problem List and Plan: 1.  Decreased functional mobility secondary to  left BKA 08/16/2015 after recent left popliteal angioplasty. 2.  DVT Prophylaxis/Anticoagulation: Subcutaneous heparin. Monitor for any bleeding episodes 3. Pain Management: Neurontin 300 mg twice a day, Robaxin and oxycodone as needed 4. Acute blood loss anemia. Follow-up CBC 5. Neuropsych: This patient is capable of making decisions on his own behalf. 6. Skin/Wound Care: Routine skin checks 7. Fluids/Electrolytes/Nutrition: Routine I&O's will follow-up chemistries 8. Diabetes mellitus with peripheral neuropathy.Hemoglobin A1c 7.0 area Lantus insulin 12 units daily, NovoLog 3 units 3 times a day. Check blood sugars before meals and at bedtime and provide diabetic teaching 9. Hypertension. Coreg 3.125 mg twice a day, Lisinopril 20 mg twice a day, HCTZ 12.5 mg twice a day. Monitor decreased mobility 10. Hyperlipidemia. Zocor 11. History CVA 20/15 without residual. Continue low-dose aspirin 12. BPH. Flomax 0.4 mg daily. Check PVRs  3 13. History of tobacco/alcohol abuse. Monitor for any signs of withdrawal. Provide counseling 14. Chronic renal insufficiency. Baseline creatinine 1.34. Follow-up chemistries 15. Hyponetremia. Follow-up chemistries 16. PVD: Cont meds 17. Hepatitis C: Avoid hepatotoxic meds. 18. SIRS: cont abx, cont to monitor  Post Admission Physician Evaluation: 1. Functional deficits secondary  to left BKA 08/16/2015 after recent left popliteal angioplasty. 2. Patient is admitted to receive collaborative, interdisciplinary care between the physiatrist, rehab nursing staff, and therapy team. 3. Patient's level of medical complexity and substantial therapy needs in context of that medical necessity cannot be provided at a lesser intensity of care such as a SNF. 4. Patient has experienced substantial functional loss from his/her baseline which was documented above under the "Functional History" and "Functional Status" headings.  Judging by the patient's diagnosis, physical exam, and  functional history, the patient has potential for functional progress which will result in measurable gains while on inpatient rehab.  These gains will be of substantial and practical use upon discharge  in facilitating mobility and self-care at the household level. 5. Physiatrist will provide 24 hour management of medical needs as well as oversight of the therapy plan/treatment and provide guidance as appropriate regarding the interaction of the two. 6. 24 hour rehab nursing will assist with safety, skin/wound care, disease management, pain management and patient education and help integrate therapy concepts, techniques,education, etc. 7. PT will assess and treat for/with: Lower extremity strength, range of motion, stamina, balance, functional mobility, safety, adaptive techniques and equipment, woundcare, coping skills, pain control, pre-prosthetic education.   Goals are: Mod I at wheelchair leve. 8. OT will assess and treat for/with: ADL's, functional mobility, safety, upper extremity strength, adaptive techniques and equipment, wound mgt, ego support, and community reintegration.   Goals are: Mod I at wheelchair level. Therapy may not proceed with showering this patient. 9. Case Management and Social Worker will assess and treat for psychological issues and discharge planning. 10. Team conference will be held weekly to assess progress toward goals and to determine barriers to discharge. 11. Patient will receive at least 3 hours of therapy per day at least 5 days per week. 12. ELOS: 8-12 days. 13. Prognosis:  good  Delice Lesch, MD 08/18/2015

## 2015-08-21 NOTE — Discharge Instructions (Signed)
Gangrene Gangrene is a medical condition caused by a lack of blood supply to an area of your body. Oxygen travels through your blood, so less blood means less oxygen. Without oxygen, your body tissue will start to die.  Gangrene can affect many parts of your body. Gangrene is most common on the skin (external gangrene), but it can also affect internal parts of the body (internal gangrene).  Gangrene requires emergency treatment. CAUSES  Any condition that cuts off blood supply can cause gangrene. Common causes include:  Injuries.  Blood vessel disease.  Diabetes.  Infections. RISK FACTORS You may be at increased risk for gangrene if you have:  A serious injury.  Recent surgery.  Diabetes.  A weak body defense system (immune system).  Narrowing of your arteries (arteriosclerosis).  An immune system disease that causes your arteries to constrict (Raynaud disease).  A history of IV drug use. SIGNS AND SYMPTOMS The signs and symptoms depend on what part of your body is affected. If you have external gangrene, you may have:  Severe pain followed by loss of feeling.  Redness and swelling.  Crackling sounds when you press on a swollen area of skin.  A wound with bad-smelling drainage.  Changes in the color of your skin. It may turn red, blue, black, or white.  Fever and chills. If you have internal gangrene, you may have:  Fever and chills.  Confusion.  Dizziness.  Severe pain.  Rapid heartbeat and breathing.  Loss of appetite.  Nausea or vomiting. DIAGNOSIS  To diagnose gangrene, your health care provider will take your medical history and do a physical exam. Tests may also be done to help in making the diagnosis. These may include:  X-rays of your blood vessels after injection of a dye (arteriogram).  Blood tests to look for an infection in your blood.  Imaging studies such as a CT scan or MRI.  Taking a swab from a draining wound to look for bacteria in  the laboratory (culture).  Taking a piece of tissue (biopsy) to look for cell death in the laboratory.  A surgical procedure to look for gangrene inside your body. TREATMENT  Treatment for gangrene depends on the cause and the area of your body that is affected. Gangrene is usually treated in the hospital. It is very important to start treatment early before gangrene spreads. Treatment may include the following:  Medicine. You may get high doses of antibiotic medicine for gangrene caused by infection. The antibiotics may be given directly into a vein through an IV tube.  Surgery. Surgical options may include:  Debridement. This is surgery to remove dead tissue. You may need to have debridement several times.  Bypass or angioplasty. This surgery improves the blood supply to an affected area.  Amputation. Sometimes it is necessary to remove a body part.  Oxygen therapy. This involves treatment in a chamber designed to provide high levels of oxygen (hyperbaric oxygen therapy). It can improve the oxygen supply to an affected area.  Supportive care. This may include:  IV fluids and nutrients.  Pain medicines.  Blood thinners to prevent blood clots. HOME CARE INSTRUCTIONS  Take medicines only as directed by your health care provider.  If you were prescribed an antibiotic medicine, finish it all even if you start to feel better.  Clean any cuts or scratches with an antiseptic.  Cover any open wounds.  Check wounds for signs of infection. Watch for redness or swelling.  Do not use any  tobacco products, including cigarettes, chewing tobacco, or electronic cigarettes. If you need help quitting, ask your health care provider.  Limit alcohol intake to no more than 1 drink per day for nonpregnant women and 2 drinks per day for men. One drink equals 12 ounces of beer, 5 ounces of wine, or 1 ounces of hard liquor.  Eat a healthy diet and maintain a healthy weight.  Get some exercise on  most days of the week. Ask your health care provider to suggest activities that are safe for you.  If you have diabetes or another blood vessel disease, check your feet often for signs of injury or infection.  After any surgery you have, follow your health care provider's instructions carefully.  Keep all follow-up visits as directed by your health care provider. This is important. SEEK MEDICAL CARE IF:  You have a wound or sore that is not healing.  You have color changes (white, red, blue, or black) in an area of your skin.  You have a wound or sore with smelly drainage. SEEK IMMEDIATE MEDICAL CARE IF:   You have rapidly worsening pain at the site of a skin infection or wound.  You lose sensation at the site of a skin infection or wound.  You have unexplained:  Fever.  Chills.  Confusion.  Fainting. MAKE SURE YOU:   Understand these instructions.  Will watch your condition.  Will get help right away if you are not doing well or get worse.   This information is not intended to replace advice given to you by your health care provider. Make sure you discuss any questions you have with your health care provider.   Document Released: 11/30/2003 Document Revised: 02/18/2014 Document Reviewed: 03/24/2013 Elsevier Interactive Patient Education 2016 Circle With an Amputation The most common causes of amputation from the hip down include:  Diseases that:  Reduce the blood flow to an area of your body.  Decrease your body's ability to fight infection.  Traumatic injuries that cause significant damage to body tissues.  Birth defects.  Cancerous lumps (malignant tumors). Amputation above the hip is usually the result of trauma or birth defect. Disease is a less common cause. Living with an amputation can be challenging, but you can still live a long, productive life. WHAT ARE THE COMMON CHALLENGES OF LIVING WITH AN AMPUTATION? The most common challenges  are mobility and self-care. With some new habits, though, it is often possible to do all of the activities you used to do. You may be able to use a device that substitutes for your limb (prosthesis). The prosthesis helps you adapt more quickly to these challenges. Your health care provider can help select a prosthesis to meet your needs. A person who helps you choose and fits you with a prosthesis (prosthetist) may also help. A rehabilitation program can also help you gain mobility and self-reliance. Your rehabilitation team may include:  Physicians.  Physical and occupational therapists.  Prosthetists.  Nurses.  Social workers.  Psychologists.  Dietitians. Your rehabilitation team will help you with all aspects of recovery and returning to work, home, sports, and your community. You will learn to:  Get around safely.  Adjust your home.  Exercise.  Use a prosthetic.  Work through Financial risk [employer].  Connect with other people who have gone through the same experience. Additional challenges may include:  Grieving period.  Body image issues.  Lifestyle issues, such as sex.  Maintaining a healthy weight. These issues are normal.  Discuss these with your rehabilitation team. WHEN CAN I RETURN TO MY REGULAR ACTIVITIES?  Returning to your normal activities is part of healing. Changes can often be made to equipment that allow you to return to a sport or hobby. Some Teacher, English as a foreign language for this. Discuss all of your leisure interests with your health care provider and prosthetist. WHEN CAN I RETURN TO WORK? When you are ready to return to work, your therapists can perform job site evaluations and make recommendations to help you perform your job. You may not be able to return to your same job. Your local Office of Vocational Rehabilitation can assist you in job retraining. FOR MORE INFORMATION: Visit these online resources. You can find tips on everything from  getting dressed and using bathrooms to driving and travel considerations. These resources can also connect you to a network of emotional support, activities, and innovations. You may also search the Internet to find a local support group.  Amputee Coalition: http://www.amputee-coalition.org/ensuring-fall-safety/http://www.amputee-coalition.org/ensuring-fall-safety/  Amputee Support Groups: http://amputee.http://fisher.org/.supportgroups.com  Daily Strength: CellCamcorder.ca  [employer]: http://www.nationalamputation.org/http://www.nationalamputation.[facility] on Health, Physical Activity and Disability: MobileShades.de.org  [employer] [place]: http://www.disabledsportsusa.orghttp://www.disabledsportsusa.org  [employer] of Orth[employer]ists and Prosthetists: TutorTesting.pl.org   This information is not intended to replace advice given to you by your health care provider. Make sure you discuss any questions you have with your health care provider.   Document Released: 10/20/2001 Document Revised: 02/18/2014 Document Reviewed: 06/15/2013 Elsevier Interactive Patient Education 2016 Elsevier Inc.  Phantom Limb Pain Phantom limb pain occurs in an arm or leg following an amputation. It is pain in an extremity that no longer exists. This pain varies with different patients. Different activities may cause the pain. Some people with an amputated limb experience the opposite of phantom pain, which is phantom pleasure.  The trouble may start in a part of the brain known as the sensory cortex. The sensory cortex is the portion of your brain that processes sensations from the rest of your body. It is hypothesized that when a body part is lost, the corresponding part of the brain is not able to handle the loss and rewires its  circuitry to make up for the signals it no longer receives from the missing extremity. The exact mechanism of how phantom limb pain occurs is not known. The severity of pain seems to be correlated with personal problems such as stress and attitude. It also seems to correlate with the amount of pain a person had before the operation. CAUSES   Damaged nerve endings.  Scar tissue at the amputation site. TREATMENT  Phantom limb pain can be severe and debilitating. Most cases of phantom limb pain only last briefly. There are a number of different therapies and medications that may give relief. Keep working with your health care provider until relief is obtained. Some treatments that may be helpful include:  Hypnosis and mental imagery. Their techniques can give patients the needed impetus to recognize their ability to regain control.  Biofeedback.  Relaxation techniques. They are related to hypnosis techniques and use the mind and body to control pain.  Acupuncture.  Massage.  Exercise.  Antidepressant medicine.  Anticonvulsant medicine.  Narcotics or pain medicines. SEEK MEDICAL CARE IF: Pain is not relieved or increases.   This information is not intended to replace advice given to you by your health care provider. Make sure you discuss any questions you have with your health care provider.   Document Released: 04/20/2002 Document Revised: 09/30/2012 Document  Reviewed: 06/30/2012 Elsevier Interactive Patient Education [employer].

## 2015-08-21 NOTE — Progress Notes (Signed)
Cristina Gong, RN Rehab Admission Coordinator Signed Physical Medicine and Rehabilitation PMR Pre-admission 08/21/2015 12:59 PM  Related encounter: ED to Hosp-Admission (Discharged) from 08/14/2015 in [place] Collapse All   PMR Admission Coordinator Pre-Admission Assessment  Patient: Cory Weber is an 65 y.o., male MRN: [id number] DOB: Oct 26, 1950 Height: 6\' 1"  (185.4 cm) Weight: 90.3 kg (199 lb 1.2 oz)  Insurance Information HMO: PPO: PCP: IPA: 80/20: OTHER:  PRIMARY: Medicaid [place] Access Policy#: [id number] s Subscriber: pt  Medicaid Application Date: Case Manager:  Disability Application Date: Case Worker:   Emergency Contact Information Contact Information    Name Relation Home Work Mobile   [place] Sister   [phone]   Mcguire,Jackie Sister   [phone]     Current Medical History  Patient Admitting Diagnosis: left BKA  History of Present Illness: Cory Weber is a 65 y.o. right handed male with history of hypertension, tobacco abuse, diabetes mellitus, hepatitis C, CVA 2015 without residual maintained on aspirin and chronic renal insufficiency with creatinine 1.34 and peripheral vascular disease. Patient lives alone and was independent with assistive device prior to admission. Pesented 08/15/2015 after recent left popliteal angioplasty one week ago and was discharged to home 08/10/2015 ambulating 100 feet with minimal assistance and noted a progressive Pain with gangrenous changes to the left foot. Patient noted while changing dressing with findings of maggots in the wound. The leg was deemed nonsalvageable and underwent left BKA 08/16/2015 per Dr. Trula Slade. [facility] course pain management.Pre-prosthetic  training with [employer] prosthetics. Mild leukocytosis 12,300 Improved to 10,700 as well as low-grade fever 100.6 and monitored with IV antibiotics completed 08/18/2015 and addition of Doxycycline 08/19/2015 . Acute blood loss anemia 8.7 and monitored. Subcutaneous heparin for DVT prophylaxis.   Past Medical History  Past Medical History  Diagnosis Date  . Hypertension   . Hyperlipidemia   . Asthma   . PVD (peripheral vascular disease) ([place])   . Anemia   . Peripheral neuropathy ([place])   . Cellulitis and abscess of foot 07/2015  . Pneumonia X 1  . Type II diabetes mellitus ([place])   . Hepatitis C     states he was diagnosed in 2007 or 2007 while living in [place], [place]  . CVA (cerebral vascular accident) [facility]) 2015    denies residual on 08/15/2015    Family History  family history includes Breast cancer in his mother and sister; Cancer in his mother; Diabetes in his father and sister; Heart disease in his father; Heart failure in his mother; Hyperlipidemia in his father; Hypertension in his father and sister. There is no history of Colon cancer or Liver disease.  Prior Rehab/Hospitalizations:  Has the patient had major surgery during 100 days prior to admission? Yes  Current Medications   Current facility-administered medications:  . acetaminophen (TYLENOL) tablet 650 mg, 650 mg, Oral, Q6H PRN, 650 mg at 08/16/15 2104 **OR** acetaminophen (TYLENOL) suppository 650 mg, 650 mg, Rectal, Q6H PRN, Toy Baker, MD . acidophilus (RISAQUAD) capsule 2 capsule, 2 capsule, Oral, Daily, Albertine Patricia, MD, 2 capsule at 08/21/15 1025 . aspirin EC tablet 81 mg, 81 mg, Oral, Daily, Albertine Patricia, MD, 81 mg at 08/21/15 1027 . carvedilol (COREG) tablet 3.125 mg, 3.125 mg, Oral, BID WC, Toy Baker, MD, 3.125 mg at 08/21/15 1025 . doxycycline (VIBRA-TABS) tablet 100 mg, 100 mg, Oral, Q12H, Clanford L Johnson, MD, 100 mg at 08/21/15  1025 . famotidine (PEPCID) tablet 20 mg,  20 mg, Oral, Daily, Toy Baker, MD, 20 mg at 08/21/15 1025 . feeding supplement (GLUCERNA SHAKE) (GLUCERNA SHAKE) liquid 237 mL, 237 mL, Oral, TID BM, Silver Huguenin Elgergawy, MD, 237 mL at 08/21/15 1031 . feeding supplement (PRO-STAT SUGAR FREE 64) liquid 30 mL, 30 mL, Oral, Q1500, Albertine Patricia, MD, 30 mL at 08/20/15 1719 . gabapentin (NEURONTIN) capsule 300 mg, 300 mg, Oral, BID, Toy Baker, MD, 300 mg at 08/21/15 1025 . heparin injection 5,000 Units, 5,000 Units, Subcutaneous, Q8H, Albertine Patricia, MD, 5,000 Units at 08/21/15 0502 . lisinopril (PRINIVIL,ZESTRIL) tablet 20 mg, 20 mg, Oral, BID, 20 mg at 08/21/15 1025 **AND** hydrochlorothiazide (MICROZIDE) capsule 12.5 mg, 12.5 mg, Oral, BID, Toy Baker, MD, 12.5 mg at 08/21/15 1025 . insulin aspart (novoLOG) injection 0-9 Units, 0-9 Units, Subcutaneous, TID WC, Albertine Patricia, MD, 5 Units at 08/21/15 1155 . insulin aspart (novoLOG) injection 3 Units, 3 Units, Subcutaneous, TID WC, Clanford Marisa Hua, MD, 3 Units at 08/21/15 1155 . insulin glargine (LANTUS) injection 12 Units, 12 Units, Subcutaneous, Daily, Albertine Patricia, MD, 12 Units at 08/21/15 1026 . morphine 2 MG/ML injection 1 mg, 1 mg, Intravenous, Q3H PRN, Toy Baker, MD, 1 mg at 08/18/15 0006 . ondansetron (ZOFRAN) tablet 4 mg, 4 mg, Oral, Q6H PRN **OR** ondansetron (ZOFRAN) injection 4 mg, 4 mg, Intravenous, Q6H PRN, Toy Baker, MD, 4 mg at 08/16/15 0942 . oxyCODONE-acetaminophen (PERCOCET/ROXICET) 5-325 MG per tablet 1-2 tablet, 1-2 tablet, Oral, Q4H PRN, Hulen Shouts Rhyne, PA-C, 2 tablet at 08/21/15 1025 . polyethylene glycol (MIRALAX / GLYCOLAX) packet 17 g, 17 g, Oral, Daily, Clanford L Johnson, MD, 17 g at 08/21/15 1154 . senna-docusate (Senokot-S) tablet 2 tablet, 2 tablet, Oral, BID, Clanford Marisa Hua, MD, 2 tablet at 08/21/15 1154 . simvastatin (ZOCOR) tablet 20 mg, 20 mg,  Oral, q1800, Albertine Patricia, MD, 20 mg at 08/20/15 1719 . tamsulosin (FLOMAX) capsule 0.4 mg, 0.4 mg, Oral, Daily, Toy Baker, MD, 0.4 mg at 08/21/15 1025 . thiamine (VITAMIN B-1) tablet 100 mg, 100 mg, Oral, Daily, Toy Baker, MD, 100 mg at 08/21/15 1025  Patients Current Diet: Diet Carb Modified Fluid consistency:: Thin; Room service appropriate?: Yes  Precautions / Restrictions Precautions Precautions: Fall Restrictions Weight Bearing Restrictions: Yes LLE Weight Bearing: Non weight bearing   Has the patient had 2 or more falls or a fall with injury in the past year?No  Prior Activity Level Limited Community (1-2x/wk): does not drive due to no license. Mod I with RW pta and cane at times  Home Assistive Devices / [place] Devices/Equipment: Environmental consultant (specify type), Cane (specify quad or straight), Eyeglasses, CBG Meter, Dentures (specify type), Bedside commode/3-in-1, Shower chair with back, Grab bars in shower, Grab bars around toilet, Hand-held shower hose Home Equipment: Environmental consultant - 2 wheels, Bedside commode, Cane - single point, Shower seat - built in, [employer] - toilet, Grab bars - tub/shower  Prior Device Use: Indicate devices/aids used by the patient prior to current illness, exacerbation or injury? Walker  Prior Functional Level Prior Function Level of Independence: Independent with assistive device(s) Comments: using rw  Self Care: Did the patient need help bathing, dressing, using the toilet or eating? Needed some help  Indoor Mobility: Did the patient need assistance with walking from room to room (with or without device)? Independent  Stairs: Did the patient need assistance with internal or external stairs (with or without device)? Needed some help  Functional Cognition: Did the patient need help planning regular  tasks such as shopping or remembering to take medications? Needed some help  Current Functional Level Cognition   Overall Cognitive Status: Within Functional Limits for tasks assessed Orientation Level: Oriented X4   Extremity Assessment (includes Sensation/Coordination)  Upper Extremity Assessment: Generalized weakness  Lower Extremity Assessment: Defer to PT evaluation LLE Deficits / Details: pt able to move LLE actively with bed mobility.     ADLs  Overall ADL's : Needs assistance/impaired Grooming: Wash/dry hands, Wash/dry face, Set up, Sitting, Min guard Upper Body Bathing: Supervision/ safety, Set up, Sitting Lower Body Bathing: Moderate assistance Upper Body Dressing : Set up, Supervision/safety, Sitting Lower Body Dressing: Maximal assistance, Sitting/lateral leans Lower Body Dressing Details (indicate cue type and reason): pt educated on compensatory LB dressing techniques Toilet Transfer: (Pt is mod A with transfers per PT) Toilet Transfer Details (indicate cue type and reason): Ubable to SPT to [facility]. Attempted sit - stand x 3 at RW from recliner before pt able to come to appropriate standing postion, requring cues to stand upright and use B UEs and cues for hand placement  Functional mobility during ADLs: (Pt is mod A with transfers per PT) General ADL Comments: Pt completed bed mobility and SPT to recliner to simulate functional transfers. Pt reporting dizziness. VSS. Pt also reporting phantom limb pain/sensations. Discussed mirror box therapy .    Mobility  Overal bed mobility: Needs Assistance Bed Mobility: Supine to Sit Supine to sit: Min assist General bed mobility comments: Pt used bed rail and PTA for support to elevate trunk, min assist to scoot L hip forward.     Transfers  Overall transfer level: Needs assistance Equipment used: Rolling walker (2 wheeled) Transfers: Sit to/from Stand Sit to Stand: Min assist, Min guard (1st attempt min assist, 2nd attempt min guard. ) Stand pivot transfers: Mod assist General transfer comment: Pt required cues for hand  placement and increased assist to pivot to recliner chair.     Ambulation / Gait / Stairs / Wheelchair Mobility  Ambulation/Gait Ambulation/Gait assistance: Mod assist Ambulation Distance (Feet): 2 Feet Assistive device: Rolling walker (2 wheeled) Gait Pattern/deviations: (Pt performed two shuffling hop to steps. pt fearful to commit to technique to advance gait distance. ) General Gait Details: as pt fatigued, pt safety awareness and compliance with cues for safety decreased. Having to bring chair from behind and assist pt to sitting. Pt remains to self limit progression of gait distance.  Gait velocity: slow pattern    Posture / Balance Balance Overall balance assessment: Needs assistance Sitting-balance support: Feet supported, No upper extremity supported Sitting balance-Leahy Scale: Good Standing balance support: Bilateral upper extremity supported Standing balance-Leahy Scale: Poor Standing balance comment: rw and min guard/min A for balance    Special needs/care consideration Skin surgical incision Bowel mgmt: no BM in 7 days. Laxatives given 7/10 Bladder mgmt: urinal Diabetic mgmt Hgb A1c 7 6/17. Pt does not have a CBG meter at home to monitor his CBGs at this time Pt had Temp 100.6 at 2145 08/20/15. Temp 98.8 at 12 noon 08/21/15   Previous Home Environment Living Arrangements: Alone Lives With: Alone Available Help at Discharge: Available PRN/intermittently Type of Home: Apartment Home Layout: One level Home Access: Level entry Bathroom Shower/Tub: Tub/shower unit, Curtain (has handicapped bars around tub) Bathroom Toilet: Handicapped height Bathroom Accessibility: Yes How Accessible: Accessible via walker, Accessible via wheelchair (to the toilet , not all the way to tub with wheelchair) [place]: Yes Type of [place]: Homehealth aide,  Home RN (RN through [facility], aide 2 hrs per day 7 days per week thru) Additional Comments: pt  reports that he thinks that he will need to go somewhere for more therapy and help before going back home.   Discharge Living Setting Plans for Discharge Living Setting: Patient's home, Apartment, Alone Type of Home at Discharge: Apartment Discharge Home Layout: One level Discharge Home Access: Level entry Discharge Bathroom Shower/Tub: Tub/shower unit, Curtain (with handicapped bars around tub) Discharge Bathroom Toilet: Handicapped height Discharge Bathroom Accessibility: Yes How Accessible: Accessible via wheelchair, Accessible via walker (to the tub but not all the way to the tub in wheelchair) Does the patient have any problems obtaining your medications?: (gets meds through [place])  [place] Patient Roles: Parent (Has adult daughter in [place]) Sport and exercise psychologist Information: sisters, Kenney Houseman and [place] locally Anticipated Caregiver: sisters and PCS aide prn only Anticipated Ambulance person Information: see above Ability/Limitations of Caregiver: intermittent only Caregiver Availability: Intermittent Discharge Plan Discussed with Primary Caregiver: Yes Is Caregiver In Agreement with Plan?: Yes Does Caregiver/Family have Issues with Lodging/Transportation while Pt is in Rehab?: No Pt has [employer] aide 2 hrs per day 7 days per week. He is hopefulo her hrs can be increased some at d/c.Pt used [facility] pts, but likely will request change for RN was doing daily dressing changes, but pt had family member change his dressing on the day of admit and found Maggots in the wound and pt then sought admit to ER.  Goals/Additional Needs Patient/Family Goal for Rehab: Mod I PT and OT at wheelchair level Expected length of stay: ELOS 8- 12 days Equipment Needs: Pt does not have a CBG meter at home; needs a replacement Pt/Family Agrees to Admission and willing to participate: Yes Program Orientation Provided & Reviewed with Pt/Caregiver Including Roles &  Responsibilities: Yes  Decrease burden of Care through IP rehab admission: n/a  Possible need for SNF placement upon discharge: not anticipated  Patient Condition: This patient's medical and functional status has changed since the consult dated: 08/18/2015 in which the Rehabilitation Physician determined and documented that the patient's condition is appropriate for intensive rehabilitative care in an inpatient rehabilitation facility. See "History of Present Illness" (above) for medical update. Functional changes are: overall min to mod assist. Patient's medical and functional status update has been discussed with the Rehabilitation physician and patient remains appropriate for inpatient rehabilitation. Will admit to inpatient rehab today.  Preadmission Screen Completed By: Cleatrice Burke, 08/21/2015 1:08 PM ______________________________________________________________________  Discussed status with Dr. Posey Pronto on 08/21/2015 at 1307 and received telephone approval for admission today.  Admission Coordinator: Cleatrice Burke, time [id number] Date 08/21/2015          Cosigned by: Ankit Lorie Phenix, MD at 08/21/2015 1:13 PM  Revision History     Date/Time User Provider Type Action   08/21/2015 1:13 PM Ankit Lorie Phenix, MD Physician Cosign   08/21/2015 1:08 PM Cristina Gong, RN Rehab Admission Coordinator Sign

## 2015-08-21 NOTE — Progress Notes (Signed)
MD paged about patient not having a bowel movement for 7 days. Stool softeners and laxatives ordered.

## 2015-08-21 NOTE — Progress Notes (Addendum)
Patient ID: Cory Weber, male   DOB: 1950-09-17, 65 y.o.   MRN: [id number] Patient admitted to 4M05 via bed, escorted by nursing staff.  Patient verbalized understanding of rehab process, signed fall safety agreement.  Patient reports he "bumped" his stump during a transfer with the nurse from his previous unit.  A small amount of bloody drainage noted at middle part of incision.  Incision still approximated, closed with steri- strips.  Brita Romp, RN

## 2015-08-22 ENCOUNTER — Inpatient Hospital Stay (HOSPITAL_COMMUNITY): Payer: Medicaid Other | Admitting: Occupational Therapy

## 2015-08-22 ENCOUNTER — Inpatient Hospital Stay (HOSPITAL_COMMUNITY): Payer: Medicaid Other | Admitting: Physical Therapy

## 2015-08-22 DIAGNOSIS — R6510 Systemic inflammatory response syndrome (SIRS) of non-infectious origin without acute organ dysfunction: Secondary | ICD-10-CM

## 2015-08-22 DIAGNOSIS — N400 Enlarged prostate without lower urinary tract symptoms: Secondary | ICD-10-CM

## 2015-08-22 DIAGNOSIS — I10 Essential (primary) hypertension: Secondary | ICD-10-CM

## 2015-08-22 DIAGNOSIS — Z89512 Acquired absence of left leg below knee: Secondary | ICD-10-CM

## 2015-08-22 DIAGNOSIS — E871 Hypo-osmolality and hyponatremia: Secondary | ICD-10-CM

## 2015-08-22 DIAGNOSIS — K5900 Constipation, unspecified: Secondary | ICD-10-CM

## 2015-08-22 DIAGNOSIS — R509 Fever, unspecified: Secondary | ICD-10-CM

## 2015-08-22 DIAGNOSIS — E1142 Type 2 diabetes mellitus with diabetic polyneuropathy: Secondary | ICD-10-CM

## 2015-08-22 DIAGNOSIS — N183 Chronic kidney disease, stage 3 (moderate): Secondary | ICD-10-CM

## 2015-08-22 DIAGNOSIS — F329 Major depressive disorder, single episode, unspecified: Secondary | ICD-10-CM

## 2015-08-22 DIAGNOSIS — D62 Acute posthemorrhagic anemia: Secondary | ICD-10-CM

## 2015-08-22 DIAGNOSIS — E1140 Type 2 diabetes mellitus with diabetic neuropathy, unspecified: Secondary | ICD-10-CM

## 2015-08-22 DIAGNOSIS — E1151 Type 2 diabetes mellitus with diabetic peripheral angiopathy without gangrene: Secondary | ICD-10-CM

## 2015-08-22 LAB — URINALYSIS, ROUTINE W REFLEX MICROSCOPIC
Bilirubin Urine: NEGATIVE
GLUCOSE, UA: NEGATIVE mg/dL
Hgb urine dipstick: NEGATIVE
Ketones, ur: NEGATIVE mg/dL
LEUKOCYTES UA: NEGATIVE
Nitrite: NEGATIVE
PH: 6 (ref 5.0–8.0)
PROTEIN: NEGATIVE mg/dL
SPECIFIC GRAVITY, URINE: 1.017 (ref 1.005–1.030)

## 2015-08-22 LAB — GLUCOSE, CAPILLARY
GLUCOSE-CAPILLARY: 168 mg/dL — AB (ref 65–99)
Glucose-Capillary: 171 mg/dL — ABNORMAL HIGH (ref 65–99)
Glucose-Capillary: 171 mg/dL — ABNORMAL HIGH (ref 65–99)
Glucose-Capillary: 199 mg/dL — ABNORMAL HIGH (ref 65–99)

## 2015-08-22 LAB — CBC WITH DIFFERENTIAL/PLATELET
BASOS ABS: 0 10*3/uL (ref 0.0–0.1)
Basophils Relative: 0 %
Eosinophils Absolute: 0.3 10*3/uL (ref 0.0–0.7)
Eosinophils Relative: 3 %
HEMATOCRIT: 28 % — AB (ref 39.0–52.0)
Hemoglobin: 8.7 g/dL — ABNORMAL LOW (ref 13.0–17.0)
LYMPHS PCT: 22 %
Lymphs Abs: 2.4 10*3/uL (ref 0.7–4.0)
MCH: 27.2 pg (ref 26.0–34.0)
MCHC: 31.1 g/dL (ref 30.0–36.0)
MCV: 87.5 fL (ref 78.0–100.0)
MONO ABS: 0.8 10*3/uL (ref 0.1–1.0)
MONOS PCT: 7 %
NEUTROS ABS: 7.5 10*3/uL (ref 1.7–7.7)
Neutrophils Relative %: 68 %
Platelets: 498 10*3/uL — ABNORMAL HIGH (ref 150–400)
RBC: 3.2 MIL/uL — ABNORMAL LOW (ref 4.22–5.81)
RDW: 14.4 % (ref 11.5–15.5)
WBC: 11 10*3/uL — ABNORMAL HIGH (ref 4.0–10.5)

## 2015-08-22 MED ORDER — FLEET ENEMA 7-19 GM/118ML RE ENEM: 1.0000 | ENEMA | Freq: Every day | RECTAL | Status: DC | PRN

## 2015-08-22 NOTE — Progress Notes (Signed)
Physical Therapy Session Note  Patient Details  Name: Cory Weber MRN: [id number] Date of Birth: 09-09-1950  Today's Date: 08/22/2015 PT Individual Time: 1300-1355 PT Individual Time Calculation (min): 55 min   Short Term Goals: Week 1:  PT Short Term Goal 1 (Week 1): Patient will perform Squat pivot transfer with mod A.  PT Short Term Goal 2 (Week 1): Patient will ambulate with RW for 15 ft with mod A  PT Short Term Goal 3 (Week 1): Patient will perform WC mobillity for 150 ft   Skilled Therapeutic Interventions/Progress Updates:  Patient on [facility] over toilet upon arrival. Session focused on squat pivot transfers from commode to wheelchair, wheelchair to and from car simulator at sedan height, and wheelchair to bed with max A overall and max verbal and tactile cues for hand placement, anterior weight shift, and head hips relationship, wheelchair propulsion using BUE in controlled environment with supervision and increased time with verbal/visual cues for efficient technique, wheelchair parts management with max verbal/visual cues and supervision after initial demonstration for donning/doffing amputee support pad and leg rest and managing arm rests, and bed mobility with supervision for sit > supine. Patient educated throughout session on proper positioning of LLE in wheelchair and bed to prevent contracture and maintain muscle extensibility. Patient left supine in bed with bed alarm on and needs in reach.   Therapy Documentation Precautions:  Precautions Precautions: Fall Restrictions Weight Bearing Restrictions: Yes LLE Weight Bearing: Non weight bearing Pain: Pain Assessment Pain Assessment: No/denies pain Pain Score: 0-No pain   See Function Navigator for Current Functional Status.   Therapy/Group: Individual Therapy  Laretta Alstrom 08/22/2015, 1:53 PM

## 2015-08-22 NOTE — Progress Notes (Signed)
Social Work  Social Work Assessment and Plan  Patient Details  Name: Cory Weber MRN: [id number] Date of Birth: Jan 31, 1951  Today's Date: 08/22/2015  Problem List:  Patient Active Problem List   Diagnosis Date Noted  . Pyrexia   . Amputation of left lower extremity below knee ([place]) 08/21/2015  . Abnormality of gait   . Phantom limb pain ([place])   . Anemia of chronic disease   . DM type 2 with diabetic peripheral neuropathy ([place])   . HLD (hyperlipidemia)   . BPH (benign prostatic hyperplasia)   . ETOH abuse   . CKD (chronic kidney disease)   . PVD (peripheral vascular disease) ([place])   . Chronic hepatitis C without hepatic coma ([place])   . History of CVA (cerebrovascular accident) without residual deficits   . Leukocytosis   . SIRS (systemic inflammatory response syndrome) (HCC)   . Tachycardia   . Hyponatremia   . Diabetic wet gangrene of the foot ([place]) 08/15/2015  . AKI (acute kidney injury) ([place])   . Atherosclerosis of left lower extremity with ulceration of midfoot ([place])   . Left foot infection 07/23/2015  . Type 2 diabetes mellitus with vascular disease ([place]) 07/23/2015  . Diabetes mellitus with foot ulcer, without long-term current use of insulin ([place]) 07/18/2015  . Chronic kidney disease, stage 3 07/18/2015  . Essential hypertension 07/18/2015  . Dyspepsia 04/13/2014  . Acute blood loss anemia 12/01/2013  . Constipation 12/01/2013  . Radiculopathy of lumbar region 11/29/2013  . Acute right lumbar radiculopathy 11/26/2013  . S/P lumbar spinal fusion 11/25/2013  . Lumbar spinal stenosis 11/24/2013  . Right leg weakness 11/23/2013  . PUD (peptic ulcer disease) 11/19/2012  . IDA (iron deficiency anemia) 07/10/2012  . Unspecified constipation 05/13/2012  . Normocytic anemia 12/03/2011  . Melena 12/03/2011  . DM (diabetes mellitus), type 2, uncontrolled ([place]) 02/03/2011  . Accelerated hypertension 02/03/2011  . Hemiparesthesia 02/03/2011  . Polysubstance abuse  02/03/2011  . Tobacco abuse 02/03/2011   Past Medical History:  Past Medical History  Diagnosis Date  . Hypertension   . Hyperlipidemia   . Asthma   . PVD (peripheral vascular disease) ([place])   . Anemia   . Peripheral neuropathy ([place])   . Cellulitis and abscess of foot 07/2015  . Pneumonia X 1  . Type II diabetes mellitus ([place])   . Hepatitis C     states he was diagnosed in 2007 or 2007 while living in [place], [place]  . CVA (cerebral vascular accident) [facility]) 2015    denies residual on 08/15/2015   Past Surgical History:  Past Surgical History  Procedure Laterality Date  . Liver biopsy  2005    Done in [place], [place]. Chronic hepatitis with mild periportal inflammation, lobular unicellular necrosis and portal fibrosis. Grade 2, stage 1-2.  Marland Kitchen Colonoscopy with propofol N/A 09/03/2012    [id number] polyp-removed as outlined above. Prominent internal hemorrhoids. Tubular adenoma  . Esophagogastroduodenoscopy (egd) with propofol N/A 09/03/2012    [id number] hernia. Gastric diverticulum. Gastric ulcers with associated erosions. Duodenal erosions. Status post gastric biopsy. H.PYLORI gastritis   . Biopsy N/A 09/03/2012    Procedure: BIOPSY;  Surgeon: Daneil Dolin, MD;  Location: [facility];  Service: Endoscopy;  Laterality: N/A;  gastric and gastric mucosa  . Polypectomy N/A 09/03/2012    Procedure: POLYPECTOMY;  Surgeon: Daneil Dolin, MD;  Location: [facility];  Service: Endoscopy;  Laterality: N/A;  cecal polyp  . Esophagogastroduodenoscopy (egd) with propofol N/A 12/03/2012  Dr. Gala Romney: gastric diverticulum, gastric erosions and scar. Previously noted gastric ulcer completed healed. Biopsy without H.pylori.   . Biopsy N/A 12/03/2012    Procedure: BIOPSY;  Surgeon: Daneil Dolin, MD;  Location: [facility];  Service: Endoscopy;  Laterality: N/A;  . Maximum access (mas)posterior lumbar interbody fusion (plif) 1 level N/A 11/25/2013    Procedure: FOR MAXIMUM ACCESS (MAS) POSTERIOR LUMBAR INTERBODY  FUSION (PLIF) 1 LEVEL;  Surgeon: Eustace Moore, MD;  Location: [place] NEURO ORS;  Service: Neurosurgery;  Laterality: N/A;  FOR MAXIMUM ACCESS (MAS) POSTERIOR LUMBAR INTERBODY FUSION (PLIF) 1 LEVEL LUMBAR 3-4  . Peripheral vascular catheterization Left 08/01/2015    Procedure: Lower Extremity Angiography;  Surgeon: Serafina Mitchell, MD;  Location: [place] CV LAB;  Service: Cardiovascular;  Laterality: Left;  . Peripheral vascular catheterization N/A 08/01/2015    Procedure: Abdominal Aortogram;  Surgeon: Serafina Mitchell, MD;  Location: [place] CV LAB;  Service: Cardiovascular;  Laterality: N/A;  . Peripheral vascular catheterization N/A 08/08/2015    Procedure: Abdominal Aortogram w/Lower Extremity;  Surgeon: Serafina Mitchell, MD;  Location: [place] CV LAB;  Service: Cardiovascular;  Laterality: N/A;  . Peripheral vascular catheterization Left 08/08/2015    Procedure: Peripheral Vascular Balloon Angioplasty;  Surgeon: Serafina Mitchell, MD;  Location: [place] CV LAB;  Service: Cardiovascular;  Laterality: Left;  left popiteal artery, left peronealtrunk, left post tibial  . Tonsillectomy    . Back surgery    . Amputation Left 08/16/2015    Procedure: LEFT BELOW THE KNEE AMPUTATION ;  Surgeon: Serafina Mitchell, MD;  Location[facility] OR;  Service: Vascular;  Laterality: Left;   Social History:  reports that he quit smoking about 5 weeks ago. His smoking use included Cigarettes. He has a 47 pack-year smoking history. He has never used smokeless tobacco. He reports that he drinks alcohol. He reports that he uses illicit drugs (Cocaine).  Family / Support Systems Marital Status: Single Patient Roles: Parent Children: has one daughter, Verlon Setting, who he talks to daily Other Supports: sister, Geannie Risen [facility]) @ (C) [phone];  sister, Jesseray Szoke [facility]) @ (C) [phone] and sister, Alvina Filbert [facility]) Anticipated Caregiver: sisters and PCS aide prn only Ability/Limitations of Caregiver:  intermittent only Caregiver Availability: Intermittent Family Dynamics: Have left messages for two sisters and await their calls to confirm how much assist they can truly provide.  Pt describes them as supportive, however, states I will have to talk with them about whether or not they can assist after d/c  Social History Preferred language: English Religion: Christian Cultural Background: NA Education: HS Read: Yes Write: Yes Employment Status: Disabled Date Retired/Disabled/Unemployed: ~5 yrs.  Had worked in Publishing rights manager Issues: None Guardian/Conservator: None - per MD, pt is capable of making decisions on his own behalf.   Abuse/Neglect Physical Abuse: Denies Verbal Abuse: Denies Sexual Abuse: Denies Exploitation of patient/patient's resources: Denies Self-Neglect: Denies  Emotional Status Pt's affect, behavior adn adjustment status: Pt able to complete interview without too much difficulty, however, he was very lethargic and occasionally dozed off and had to call name to awaken.  He was able to give information with his speech difficult to understand at times.  He quickly denies any depression, anxiety or other emotional distress. I do feel this needs to be monitored and may need neuropsych consult Recent Psychosocial Issues: Chronic health issues requiring assist at home from [facility] aide.   Pyschiatric History: Pt denies Substance Abuse History: Pt denies  Patient / Family Perceptions, Expectations & Goals Pt/Family understanding of illness & functional limitations: Pt with very basic understanding of the medical attempts to salvage limb and reasons to ultimately perform BKA.  He has general awareness of his safety issues and current functional limitations/ need for CIR. Premorbid pt/family roles/activities: Pt was independent with rw and had PCS aide 2 hrs /day for ADLs. Anticipated changes in roles/activities/participation: Depedent on goals, he may need  simply an increase in [facility] aide hours or may need SNF as 24/7 care not available. Pt/family expectations/goals: "I want to be able to do for myself but I'm gonna need more help at home."  [employer]: Other (Comment) (PCS Aide via "[facility]" (? if accurate name - will follow up)) Premorbid Home Care/DME Agencies: Other (Comment) ([facility]) Transportation available at discharge: family or Medicaid transport Resource referrals recommended: Neuropsychology, Support group (specify)  Discharge Planning Living Arrangements: Alone Support Systems: Other relatives, Home care staff Type of Residence: Private residence Insurance Resources: Medicaid (specify county) Museum/gallery curator Resources: [place] Referred: No Living Expenses: Education officer, community Management: Patient Does the patient have any problems obtaining your medications?: No Home Management: pt and aide Patient/Family Preliminary Plans: Pt plans to return to his own home and hopes to increase hours of aide assist. Barriers to Discharge: Family Support (does NOT have 24/7 support available) Social Work Anticipated Follow Up Needs: HH/OP Expected length of stay: 14-16 days  Clinical Impression Unfortunate gentleman here following BKA.  Was receiving PCS aide services PTA and will likely require increased hours upon d/c.  Family can only provide intermittent assistance and am awaiting return calls from sisters to truly determine availability.  He denies any emotional distress but flat affect and mumbles answers - will monitor.    Justice Aguirre 08/22/2015, 5:26 PM

## 2015-08-22 NOTE — Progress Notes (Signed)
Patient running temp of 100.5, Tylenol 650 mg PRN given and pass it on in report. We continue to monitor.

## 2015-08-22 NOTE — Plan of Care (Signed)
Problem: RH BOWEL ELIMINATION Goal: RH STG MANAGE BOWEL WITH ASSISTANCE STG Manage Bowel with min Assistance.  Outcome: Not Progressing Required soap suds enema Goal: RH STG MANAGE BOWEL W/MEDICATION W/ASSISTANCE STG Manage Bowel with Medication with min Assistance.  Outcome: Not Progressing Required soap suds enema

## 2015-08-22 NOTE — Plan of Care (Signed)
Problem: RH BOWEL ELIMINATION Goal: RH STG MANAGE BOWEL W/MEDICATION W/ASSISTANCE STG Manage Bowel with Medication with min Assistance.  Outcome: Not Progressing No BM since 7/3

## 2015-08-22 NOTE — Progress Notes (Signed)
Orthopedic Tech Progress Note Patient Details:  Cory Weber Jan 11, 1951 [id number]  Patient ID: Cory Weber, male   DOB: [birth year], 65 y.o.   MRN: [id number]   Cory Weber 08/22/2015, 10:06 AMCalled [employer] for left Stump protector.

## 2015-08-22 NOTE — Consult Note (Signed)
[facility] for Infectious Disease    Date of Admission:  08/21/2015   Total days of antibiotics 7        Day 4 doxycycline               Reason for Consult: Low-grade postoperative fevers    Referring Physician: Dr. Delice Lesch  Active Problems:   Pyrexia   Left foot infection   Amputation of left lower extremity below knee ([place])   Normocytic anemia   Essential hypertension   Type 2 diabetes mellitus with vascular disease (HCC)   Chronic hepatitis C without hepatic coma (HCC)   HLD (hyperlipidemia)   CKD (chronic kidney disease)   PVD (peripheral vascular disease) ([place])   . acidophilus  2 capsule Oral Daily  . aspirin EC  81 mg Oral Daily  . carvedilol  3.125 mg Oral BID WC  . doxycycline  100 mg Oral Q12H  . famotidine  20 mg Oral Daily  . gabapentin  300 mg Oral BID  . heparin  5,000 Units Subcutaneous Q8H  . lisinopril  20 mg Oral BID   And  . hydrochlorothiazide  12.5 mg Oral BID  . insulin aspart  0-9 Units Subcutaneous TID WC  . insulin aspart  3 Units Subcutaneous TID WC  . insulin glargine  12 Units Subcutaneous Daily  . polyethylene glycol  17 g Oral Daily  . senna-docusate  2 tablet Oral BID  . simvastatin  20 mg Oral q1800  . tamsulosin  0.4 mg Oral Daily  . thiamine  100 mg Oral Daily    Recommendations: 1. Continue doxycycline for now 2. I will follow-up tomorrow   Assessment: I'm not sure why he continues to have low-grade fevers but do not see any clear evidence of new infection. I would continue the doxycycline for now. I will follow-up tomorrow.    HPI: DELFIN Weber is a 65 y.o. male with diabetes, peripheral vascular disease and neuropathy who dropped a frozen chicken on his foot in early May suffering a contusion. Because of the pain he decided to soak his foot in hot water and burned himself. The area became infected and he was admitted to the hospital in early June. He was found to have arterial insufficiency and underwent  angioplasty and late June. He was treated with broad empiric antibiotic therapy throughout that 3 week hospitalization. He was discharged on June 29 but readmitted on July 4 after he noticed maggots in his foot wound. He underwent left BKA. He was initially treated perioperatively with vancomycin and piperacillin tazobactam. He was switched to doxycycline 4 days ago. He has continued to have some low-grade fevers. He has been somewhat depressed over his amputation but otherwise is feeling better. He has felt a little cold recently but denies shaking chills or sweats. He has not noted any rash or itching. He has no new cough or shortness of breath. He has no dysuria. He is constipated.   Review of Systems: Review of Systems  Constitutional: Positive for fever and malaise/fatigue. Negative for chills, weight loss and diaphoresis.  HENT: Negative for sore throat.   Respiratory: Negative for cough, sputum production and shortness of breath.   Cardiovascular: Negative for chest pain.  Gastrointestinal: Positive for constipation. Negative for heartburn, nausea, vomiting, abdominal pain and diarrhea.  Genitourinary: Negative for dysuria and frequency.  Musculoskeletal: Negative for myalgias and joint pain.  Skin: Negative for rash.  Neurological: Negative  for headaches.  Psychiatric/Behavioral: Positive for depression.    Past Medical History  Diagnosis Date  . Hypertension   . Hyperlipidemia   . Asthma   . PVD (peripheral vascular disease) ([place])   . Anemia   . Peripheral neuropathy ([place])   . Cellulitis and abscess of foot 07/2015  . Pneumonia X 1  . Type II diabetes mellitus ([place])   . Hepatitis C     states he was diagnosed in 2007 or 2007 while living in [place], [place]  . CVA (cerebral vascular accident) [facility]) 2015    denies residual on 08/15/2015    Social History  Substance Use Topics  . Smoking status: Former Smoker -- 1.00 packs/day for 47 years    Types: Cigarettes    Quit date:  07/13/2015  . Smokeless tobacco: Never Used  . Alcohol Use: 0.0 oz/week    0 Standard drinks or equivalent per week     Comment: 08/15/2015 "stopped drinking 3-4 years ago"    Family History  Problem Relation Age of Onset  . Breast cancer Mother     deceased  . Cancer Mother   . Diabetes Father   . Hypertension Father   . Heart disease Father     deceased  . Hyperlipidemia Father   . Diabetes Sister   . Hypertension Sister   . Breast cancer Sister   . Colon cancer Neg Hx   . Liver disease Neg Hx   . Heart failure Mother    No Known Allergies  OBJECTIVE: Blood pressure 118/64, pulse 98, temperature 100.5 F (38.1 C), temperature source Oral, resp. rate 18, height 6\' 1"  (1.854 m), weight 180 lb 6.4 oz (81.829 kg), SpO2 96 %.  Physical Exam  Constitutional: He is oriented to person, place, and time.  His very pleasant and in no distress. He is seated in his wheelchair.  HENT:  Mouth/Throat: No oropharyngeal exudate.  Cardiovascular: Normal rate and regular rhythm.   No murmur heard. Pulmonary/Chest: Effort normal and breath sounds normal.  Abdominal: Soft. There is no tenderness.  Musculoskeletal:  His left BKA stump is wrapped. I spoke with his nurse who stated that the wound is looking very good. He had just a very slight amount of serous drainage this morning.  Neurological: He is alert and oriented to person, place, and time.  Skin: No rash noted.  Psychiatric: Mood and affect normal.    Lab Results Lab Results  Component Value Date   WBC 11.0* 08/22/2015   HGB 8.7* 08/22/2015   HCT 28.0* 08/22/2015   MCV 87.5 08/22/2015   PLT 498* 08/22/2015    Lab Results  Component Value Date   CREATININE 1.42* 08/21/2015   BUN 32* 08/21/2015   NA 132* 08/21/2015   K 4.1 08/21/2015   CL 95* 08/21/2015   CO2 28 08/21/2015    Lab Results  Component Value Date   ALT 15* 08/21/2015   AST 21 08/21/2015   ALKPHOS 49 08/21/2015   BILITOT 0.4 08/21/2015      Microbiology: Recent Results (from the past 240 hour(s))  Blood culture (routine x 2)     Status: None   Collection Time: 08/15/15  1:02 AM  Result Value Ref Range Status   Specimen Description BLOOD RIGHT ARM  Final   Special Requests BOTTLES DRAWN AEROBIC AND ANAEROBIC 5ML  Final   Culture NO GROWTH 5 DAYS  Final   Report Status 08/20/2015 FINAL  Final  Blood culture (routine x 2)  Status: None   Collection Time: 08/15/15  1:07 AM  Result Value Ref Range Status   Specimen Description BLOOD RIGHT HAND  Final   Special Requests BOTTLES DRAWN AEROBIC AND ANAEROBIC 5ML  Final   Culture NO GROWTH 5 DAYS  Final   Report Status 08/20/2015 FINAL  Final  Surgical pcr screen     Status: None   Collection Time: 08/15/15  4:56 AM  Result Value Ref Range Status   MRSA, PCR NEGATIVE NEGATIVE Final   Staphylococcus aureus NEGATIVE NEGATIVE Final    Comment:        The Xpert SA Assay (FDA approved for NASAL specimens in patients over 81 years of age), is one component of a comprehensive surveillance program.  Test performance has been validated by [facility] for patients greater than or equal to 70 year old. It is not intended to diagnose infection nor to guide or monitor treatment.   Culture, blood (single)     Status: None   Collection Time: 08/16/15 10:55 PM  Result Value Ref Range Status   Specimen Description BLOOD RIGHT ANTECUBITAL  Final   Special Requests BOTTLES DRAWN AEROBIC AND ANAEROBIC 5CC   Final   Culture NO GROWTH 5 DAYS  Final   Report Status 08/21/2015 FINAL  Final    Michel Bickers, MD [facility] [phone] pager   [phone] cell 08/22/2015, 2:16 PM

## 2015-08-22 NOTE — IPOC Note (Signed)
Overall Plan of Care [facility]) Patient Details Name: Cory Weber MRN: [id number] DOB: Jun 16, 1950  Admitting Diagnosis: BKA  Hospital Problems: Active Problems:   Normocytic anemia   Essential hypertension   Left foot infection   Type 2 diabetes mellitus with vascular disease (HCC)   Chronic hepatitis C without hepatic coma (HCC)   HLD (hyperlipidemia)   CKD (chronic kidney disease)   PVD (peripheral vascular disease) (HCC)   Amputation of left lower extremity below knee ([place])   Pyrexia     Functional Problem List: Nursing Bowel, Endurance, Medication Management, Pain, Skin Integrity, Sensory, Safety, Motor  PT Behavior, Balance, Endurance, Pain, Perception, Safety, Sensory  OT Balance, Cognition, Edema, Endurance, Pain, Safety  SLP    TR         Basic ADL's: OT Grooming, Bathing, Dressing, Toileting     Advanced  ADL's: OT Simple Meal Preparation, Laundry     Transfers: PT Bed Mobility, Bed to Chair, Car, [employer], Futures trader, Metallurgist: PT Ambulation, Emergency planning/management officer, Stairs     Additional Impairments: OT None  SLP        TR      Anticipated Outcomes Item Anticipated Outcome  Self Feeding n/a  Swallowing      Basic self-care  supervision overall  Toileting  supervision overall   Bathroom Transfers supervision overall  Bowel/Bladder  Mod I  Transfers  Supervision A with LRAD  Locomotion  RW for household distances with supervision A. WC for community mobility.   Communication     Cognition     Pain  < 4  Safety/Judgment  Mod I   Therapy Plan: PT Intensity: Minimum of 1-2 x/day ,45 to 90 minutes PT Frequency: 5 out of 7 days PT Duration Estimated Length of Stay: 12-16 days.  OT Intensity: Minimum of 1-2 x/day, 45 to 90 minutes OT Frequency: 5 out of 7 days OT Duration/Estimated Length of Stay: 14-16 days         Team Interventions: Nursing Interventions Patient/Family Education, Bowel Management, Disease  Management/Prevention, Skin Care/Wound Management, Medication Management, Pain Management, Discharge Planning  PT interventions Ambulation/gait training, Balance/vestibular training, Cognitive remediation/compensation, Community reintegration, Discharge planning, Disease management/prevention, DME/adaptive equipment instruction, Functional mobility training, Neuromuscular re-education, Pain management, Patient/family education, Psychosocial support, Skin care/wound management, Splinting/orthotics, Stair training, Therapeutic Activities, Therapeutic Exercise, UE/LE Strength taining/ROM, UE/LE Coordination activities, Visual/perceptual remediation/compensation, Wheelchair propulsion/positioning  OT Interventions Training and development officer, Engineer, drilling, Patient/family education, Therapeutic Activities, Wheelchair propulsion/positioning, Therapeutic Exercise, Psychosocial support, Cognitive remediation/compensation, Community reintegration, Functional mobility training, UE/LE Strength taining/ROM, Self Care/advanced ADL retraining, Discharge planning, Skin care/wound managment, UE/LE Coordination activities, Pain management, Disease mangement/prevention  SLP Interventions    TR Interventions    SW/CM Interventions Discharge Planning, Psychosocial Support, Patient/Family Education    Team Discharge Planning: Destination: PT-Home ,OT- Home , SLP-  Projected Follow-up: PT-Home health PT, OT-  24 hour supervision/assistance, SLP-  Projected Equipment Needs: PT-Wheelchair (measurements), Wheelchair cushion (measurements), OT- Tub/shower bench, SLP-  Equipment Details: PT- , OT-  Patient/family involved in discharge planning: PT- Patient,  OT-Patient, SLP-   MD ELOS: 10-14 days. Medical Rehab Prognosis:  Excellent Assessment: 65 y.o. right handed male with history of hypertension, tobacco abuse, diabetes mellitus, hepatitis C, CVA 2015 without residual maintained on aspirin and chronic  renal insufficiency with creatinine 1.34 and peripheral vascular disease. Patient lives alone and was independent with assistive device prior to admission.  Presented 08/15/2015 after recent left popliteal angioplasty and was  discharged to home 08/10/2015 ambulating 100 feet with minimal assistance and noted a progressive Pain with gangrenous changes to the left foot. Patient noted while changing dressing with findings of maggots in the wound. The leg was deemed nonsalvageable and underwent left BKA 08/16/2015 per Dr. Trula Slade. [facility] course pain management.Pre-prosthetic training with [employer] prosthetics. Mild leukocytosis 12,300 Improved to 10,700 as well as low-grade fever 100.6 and monitored with IV antibiotics completed 08/18/2015 and addition of Doxycycline 08/19/2015.  Acute blood loss anemia monitored.  Pt with resulting functional deficits with mobility.  Will set goals for supervision with therapies.    See Team Conference Notes for weekly updates to the plan of care

## 2015-08-22 NOTE — Evaluation (Signed)
Occupational Therapy Assessment and Plan  Patient Details  Name: Cory Weber MRN: [id number] Date of Birth: 06-19-1950  OT Diagnosis: abnormal posture and muscle weakness (generalized) Rehab Potential: Rehab Potential (ACUTE ONLY): Good ELOS: 14-16 days   Today's Date: 08/22/2015 OT Individual Time: 1415-1530 OT Individual Time Calculation (min): 75 min     Problem List:  Patient Active Problem List   Diagnosis Date Noted  . Pyrexia   . Amputation of left lower extremity below knee ([place]) 08/21/2015  . Abnormality of gait   . Phantom limb pain ([place])   . Anemia of chronic disease   . DM type 2 with diabetic peripheral neuropathy ([place])   . HLD (hyperlipidemia)   . BPH (benign prostatic hyperplasia)   . ETOH abuse   . CKD (chronic kidney disease)   . PVD (peripheral vascular disease) ([place])   . Chronic hepatitis C without hepatic coma ([place])   . History of CVA (cerebrovascular accident) without residual deficits   . Leukocytosis   . SIRS (systemic inflammatory response syndrome) ([facility])   . Tachycardia   . Hyponatremia   . Diabetic wet gangrene of the foot ([place]) 08/15/2015  . AKI (acute kidney injury) ([place])   . Atherosclerosis of left lower extremity with ulceration of midfoot ([place])   . Left foot infection 07/23/2015  . Type 2 diabetes mellitus with vascular disease ([place]) 07/23/2015  . Diabetes mellitus with foot ulcer, without long-term current use of insulin ([place]) 07/18/2015  . Chronic kidney disease, stage 3 07/18/2015  . Essential hypertension 07/18/2015  . Dyspepsia 04/13/2014  . Acute blood loss anemia 12/01/2013  . Constipation 12/01/2013  . Radiculopathy of lumbar region 11/29/2013  . Acute right lumbar radiculopathy 11/26/2013  . S/P lumbar spinal fusion 11/25/2013  . Lumbar spinal stenosis 11/24/2013  . Right leg weakness 11/23/2013  . PUD (peptic ulcer disease) 11/19/2012  . IDA (iron deficiency anemia) 07/10/2012  . Unspecified constipation 05/13/2012  .  Normocytic anemia 12/03/2011  . Melena 12/03/2011  . DM (diabetes mellitus), type 2, uncontrolled ([place]) 02/03/2011  . Accelerated hypertension 02/03/2011  . Hemiparesthesia 02/03/2011  . Polysubstance abuse 02/03/2011  . Tobacco abuse 02/03/2011    Past Medical History:  Past Medical History  Diagnosis Date  . Hypertension   . Hyperlipidemia   . Asthma   . PVD (peripheral vascular disease) ([place])   . Anemia   . Peripheral neuropathy ([place])   . Cellulitis and abscess of foot 07/2015  . Pneumonia X 1  . Type II diabetes mellitus ([place])   . Hepatitis C     states he was diagnosed in 2007 or 2007 while living in [place], [place]  . CVA (cerebral vascular accident) [facility]) 2015    denies residual on 08/15/2015   Past Surgical History:  Past Surgical History  Procedure Laterality Date  . Liver biopsy  2005    Done in [place], [place]. Chronic hepatitis with mild periportal inflammation, lobular unicellular necrosis and portal fibrosis. Grade 2, stage 1-2.  Marland Kitchen Colonoscopy with propofol N/A 09/03/2012    RFX:JOITGPQ polyp-removed as outlined above. Prominent internal hemorrhoids. Tubular adenoma  . Esophagogastroduodenoscopy (egd) with propofol N/A 09/03/2012    DIY:MEBRAX hernia. Gastric diverticulum. Gastric ulcers with associated erosions. Duodenal erosions. Status post gastric biopsy. H.PYLORI gastritis   . Biopsy N/A 09/03/2012    Procedure: BIOPSY;  Surgeon: Daneil Dolin, MD;  Location: [facility];  Service: Endoscopy;  Laterality: N/A;  gastric and gastric mucosa  . Polypectomy N/A 09/03/2012  Procedure: POLYPECTOMY;  Surgeon: Daneil Dolin, MD;  Location: [facility];  Service: Endoscopy;  Laterality: N/A;  cecal polyp  . Esophagogastroduodenoscopy (egd) with propofol N/A 12/03/2012    Dr. Gala Romney: gastric diverticulum, gastric erosions and scar. Previously noted gastric ulcer completed healed. Biopsy without H.pylori.   . Biopsy N/A 12/03/2012    Procedure: BIOPSY;  Surgeon: Daneil Dolin, MD;   Location: [facility];  Service: Endoscopy;  Laterality: N/A;  . Maximum access (mas)posterior lumbar interbody fusion (plif) 1 level N/A 11/25/2013    Procedure: FOR MAXIMUM ACCESS (MAS) POSTERIOR LUMBAR INTERBODY FUSION (PLIF) 1 LEVEL;  Surgeon: Eustace Moore, MD;  Location: [facility];  Service: Neurosurgery;  Laterality: N/A;  FOR MAXIMUM ACCESS (MAS) POSTERIOR LUMBAR INTERBODY FUSION (PLIF) 1 LEVEL LUMBAR 3-4  . Peripheral vascular catheterization Left 08/01/2015    Procedure: Lower Extremity Angiography;  Surgeon: Serafina Mitchell, MD;  Location: [facility];  Service: Cardiovascular;  Laterality: Left;  . Peripheral vascular catheterization N/A 08/01/2015    Procedure: Abdominal Aortogram;  Surgeon: Serafina Mitchell, MD;  Location: [facility];  Service: Cardiovascular;  Laterality: N/A;  . Peripheral vascular catheterization N/A 08/08/2015    Procedure: Abdominal Aortogram w/Lower Extremity;  Surgeon: Serafina Mitchell, MD;  Location: [facility] LAB;  Service: Cardiovascular;  Laterality: N/A;  . Peripheral vascular catheterization Left 08/08/2015    Procedure: Peripheral Vascular Balloon Angioplasty;  Surgeon: Serafina Mitchell, MD;  Location: [facility] CV LAB;  Service: Cardiovascular;  Laterality: Left;  left popiteal artery, left peronealtrunk, left post tibial  . Tonsillectomy    . Back surgery    . Amputation Left 08/16/2015    Procedure: LEFT BELOW THE KNEE AMPUTATION ;  Surgeon: Serafina Mitchell, MD;  Location: [facility] OR;  Service: Vascular;  Laterality: Left;    Assessment & Plan Clinical Impression: Patient is a 65 y.o. year old male with history of hypertension, tobacco abuse, diabetes mellitus, hepatitis C, CVA 2015 without residual maintained on aspirin and chronic renal insufficiency with creatinine 1.34 and peripheral vascular disease. Patient lives alone and was independent with assistive device prior to admission. He has a personal care attendant 3 hours a day 7 days a week  to help with meals groceries and cleaning. 1 level apartment with no steps entry Presented 08/15/2015 after recent left popliteal angioplasty one week ago and was discharged to home 08/10/2015 ambulating 100 feet with minimal assistance and noted a progressive Pain with gangrenous changes to the left foot. Patient noted while changing dressing with findings of maggots in the wound. The leg was deemed nonsalvageable and underwent left BKA 08/16/2015 per Dr. Trula Slade. [facility] course pain management.Pre-prosthetic training with [employer] prosthetics. Mild leukocytosis 12,300 Improved to 10,700 as well as low-grade fever 100.6 and monitored with IV antibiotics completed 08/18/2015 and addition of Doxycycline 08/19/2015 . Acute blood loss anemia 8.7 and monitored. Subcutaneous heparin for DVT prophylaxis. PhysicalAnd occupational therapy evaluations completed with recommendations of physical medicine rehabilitation consult.Patient was admitted for comprehensive rehabilitation program . Patient transferred to CIR on 08/21/2015 .    Patient currently requires set up - max A with basic self-care skills and IADL secondary to muscle weakness, decreased cardiorespiratoy endurance and decreased sitting balance, decreased standing balance, decreased postural control and decreased balance strategies.  Prior to hospitalization, patient could complete ADLs and IADLs with modified independent .  Patient will benefit from skilled intervention to increase independence with basic self-care skills and increase level  of independence with iADL prior to discharge home with care partner.  Anticipate patient will require 24 hour supervision and follow up home health.  OT - End of Session Activity Tolerance: Decreased this session Endurance Deficit: Yes Endurance Deficit Description: rest breaks secondary to fatigue.  OT Assessment Rehab Potential (ACUTE ONLY): Good OT Patient demonstrates impairments in the following area(s):  Balance;Cognition;Edema;Endurance;Pain;Safety OT Basic ADL's Functional Problem(s): Grooming;Bathing;Dressing;Toileting OT Advanced ADL's Functional Problem(s): Simple Meal Preparation;Laundry OT Transfers Functional Problem(s): Toilet;Tub/Shower OT Additional Impairment(s): None OT Plan OT Intensity: Minimum of 1-2 x/day, 45 to 90 minutes OT Frequency: 5 out of 7 days OT Duration/Estimated Length of Stay: 14-16 days OT Treatment/Interventions: Balance/vestibular training;DME/adaptive equipment instruction;Patient/family education;Therapeutic Activities;Wheelchair propulsion/positioning;Therapeutic Exercise;Psychosocial support;Cognitive remediation/compensation;Community reintegration;Functional mobility training;UE/LE Strength taining/ROM;Self Care/advanced ADL retraining;Discharge planning;Skin care/wound managment;UE/LE Coordination activities;Pain management;Disease mangement/prevention OT Self Feeding Anticipated Outcome(s): n/a OT Basic Self-Care Anticipated Outcome(s): supervision overall OT Toileting Anticipated Outcome(s): supervision overall OT Bathroom Transfers Anticipated Outcome(s): supervision overall OT Recommendation Patient destination: Home Follow Up Recommendations: 24 hour supervision/assistance Equipment Recommended: Tub/shower bench   Skilled Therapeutic Intervention Upon entering the room, pt supine in bed sleeping and having difficulty waking up to participate in OT intervention. Once seated on EOB, pt is more alert and engaged in dressing and bathing from bed level. OT educating pt on lateral lean for hygiene as well as LB clothing management. Pt returning demonstrations with steady assist for balance and set up A to obtain clothing items. Pt performed sit <>stand with max A and use of RW. OT educating pt on OT purpose, POC, and goals with pt verbalizing understanding. OT also discussing pain management techniques such as lightly tapping or rubbing the area when pt has  phantom leg pain. Pt sitting on EOB for self care tasks with close supervision for dynamic sitting balance activity. Multiple rest breaks secondary to fatigue. Call bell and all needed items within reach as pt returned to supine at end of session.   OT Evaluation Precautions/Restrictions  Precautions Precautions: Fall Restrictions Weight Bearing Restrictions: Yes LLE Weight Bearing: Non weight bearing Vital Signs Therapy Vitals Temp: 98.7 F (37.1 C) Temp Source: Oral Pulse Rate: 97 Resp: 18 BP: 98/63 mmHg Patient Position (if appropriate): Lying Oxygen Therapy SpO2: 100 % O2 Device: Not Delivered Pain Pain Assessment Pain Assessment: No/denies pain Pain Score: 0-No pain Home Living/Prior Functioning Home Living Family/patient expects to be discharged to:: Private residence Living Arrangements: Alone Available Help at Discharge: Family Type of Home: Apartment Home Access: Stairs to enter Technical brewer of Steps: 2 Entrance Stairs-Rails: Right, Left Home Layout: One level Bathroom Shower/Tub: Tub/shower unit, Architectural technologist: Handicapped height Bathroom Accessibility: Yes  Lives With: Alone Prior Function Level of Independence: Needs assistance with homemaking, Requires assistive device for independence  Able to Take Stairs?: Yes Vocation: Retired Comments: using rw Vision/Perception  Vision- History Baseline Vision/History: Wears glasses Wears Glasses: Reading only Patient Visual Report: No change from baseline Vision- Assessment Vision Assessment?: No apparent visual deficits  Cognition Overall Cognitive Status: Impaired/Different from baseline Orientation Level: Person;Place;Situation Person: Oriented Place: Oriented Situation: Oriented Year: 2017 Month: July Day of Week: Correct Memory: Impaired Memory Impairment: Decreased recall of new information Immediate Memory Recall: Sock;Blue;Bed Memory Recall: Sock;Blue Memory Recall Sock:  Without Cue Memory Recall Blue: With Cue Awareness: Impaired Problem Solving: Impaired Behaviors: Impulsive Safety/Judgment: Impaired Comments: Phantom sensation causes decreased safety.  Sensation Sensation Light Touch: Appears Intact Stereognosis: Not tested Hot/Cold: Not tested Proprioception: Appears Intact Coordination Gross Motor Movements are  Fluid and Coordinated: Yes Fine Motor Movements are Fluid and Coordinated: Yes Motor  Motor Motor: Other (comment) Motor - Skilled Clinical Observations: deconditioned - generalized weakness Mobility  Transfers Sit to Stand: 2: Max assist Sit to Stand Details: Verbal cues for sequencing;Verbal cues for technique;Verbal cues for gait pattern;Verbal cues for safe use of DME/AE;Tactile cues for weight shifting;Tactile cues for placement;Tactile cues for weight beaing;Visual cues for safe use of DME/AE;Manual facilitation for weight shifting;Manual facilitation for placement  Trunk/Postural Assessment  Cervical Assessment Cervical Assessment: Within Functional Limits Thoracic Assessment Thoracic Assessment: Within Functional Limits Lumbar Assessment Lumbar Assessment: Within Functional Limits (posterior pelvic tilt) Postural Control Postural Control: Within Functional Limits  Balance Balance Balance Assessed: Yes Static Sitting Balance Static Sitting - Level of Assistance: 5: Stand by assistance Dynamic Sitting Balance Dynamic Sitting - Level of Assistance: 4: Min assist Static Standing Balance Static Standing - Level of Assistance: 3: Mod assist Dynamic Standing Balance Dynamic Standing - Level of Assistance: 2: Max assist Extremity/Trunk Assessment RUE Assessment RUE Assessment: Within Functional Limits LUE Assessment LUE Assessment: Within Functional Limits   See Function Navigator for Current Functional Status.   Refer to Care Plan for Long Term Goals  Recommendations for other services: None  Discharge Criteria:  Patient will be discharged from OT if patient refuses treatment 3 consecutive times without medical reason, if treatment goals not met, if there is a change in medical status, if patient makes no progress towards goals or if patient is discharged from hospital.  The above assessment, treatment plan, treatment alternatives and goals were discussed and mutually agreed upon: by patient  Phineas Semen 08/22/2015, 4:55 PM

## 2015-08-22 NOTE — Progress Notes (Signed)
Orthopedic Tech Progress Note Patient Details:  Cory Weber 10/22/1950 [id number] Brace order completed by bio-tech vendor. Patient ID: Cory Weber, male   DOB: May 12, 1950, 65 y.o.   MRN: [id number]   [name] 08/22/2015, 5:51 PM

## 2015-08-22 NOTE — Progress Notes (Addendum)
[facility] PHYSICAL MEDICINE & REHABILITATION     PROGRESS NOTE  Subjective/Complaints:  Pt sitting up at the edge of the bed.  He said he bumped his stump last night trying to get to the restroom.   ROS: Denies CP, SOB, N/V/D.  Objective: Vital Signs: Blood pressure 118/64, pulse 98, temperature 100.5 F (38.1 C), temperature source Oral, resp. rate 18, height 6\' 1"  (1.854 m), weight 81.829 kg (180 lb 6.4 oz), SpO2 96 %. No results found.  Recent Labs  08/21/15 0329  WBC 10.7*  HGB 8.7*  HCT 27.5*  PLT 453*    Recent Labs  08/21/15 0329  NA 132*  K 4.1  CL 95*  GLUCOSE 163*  BUN 32*  CREATININE 1.42*  CALCIUM 9.4   CBG (last 3)   Recent Labs  08/21/15 1632 08/21/15 2148 08/22/15 0706  GLUCAP 179* 157* 171*    Wt Readings from Last 3 Encounters:  08/21/15 81.829 kg (180 lb 6.4 oz)  08/15/15 90.3 kg (199 lb 1.2 oz)  07/23/15 90.266 kg (199 lb)    Physical Exam:  BP 118/64 mmHg  Pulse 98  Temp(Src) 100.5 F (38.1 C) (Oral)  Resp 18  Ht 6\' 1"  (1.854 m)  Wt 81.829 kg (180 lb 6.4 oz)  BMI 23.81 kg/m2  SpO2 96% Constitutional: He appears well-developed and well-nourished.  HENT: Normocephalic and atraumatic.  Cardiovascular: Normal rate and regular rhythm.  Respiratory: Effort normal and breath sounds normal. No respiratory distress.  GI: Soft. Bowel sounds are normal. He exhibits no distension.  Musculoskeletal: He exhibits edema and tenderness.  Neurological: He is alert and oriented.  Motor: B/l UE, RLE: 5/5 proximal to distal LLE: hip flexion 4+/5  Skin: Skin is warm and dry.  Amputation site with mild sanguinous drainage along staples lines Psychiatric: He has a normal mood and affect. His behavior is normal.   Assessment/Plan: 1. Functional deficits secondary to to left BKA 08/16/2015 after recent left popliteal angioplasty which require 3+ hours per day of interdisciplinary therapy in a comprehensive inpatient rehab  setting. Physiatrist is providing close team supervision and 24 hour management of active medical problems listed below. Physiatrist and rehab team continue to assess barriers to discharge/monitor patient progress toward functional and medical goals.  Function:  Bathing Bathing position      Bathing parts      Bathing assist        Upper Body Dressing/Undressing Upper body dressing                    Upper body assist        Lower Body Dressing/Undressing Lower body dressing                                  Lower body assist        Toileting Toileting          Toileting assist     Transfers Chair/bed transfer             Locomotion Ambulation           Wheelchair          Cognition Comprehension Comprehension assist level: Follows basic conversation/direction with no assist  Expression Expression assist level: Expresses basic needs/ideas: With no assist  Social Interaction Social Interaction assist level: Interacts appropriately 90% of the time - Needs monitoring or encouragement for participation or interaction.  Problem Solving  Problem solving assist level: Solves basic 90% of the time/requires cueing < 10% of the time  Memory Memory assist level: Recognizes or recalls 90% of the time/requires cueing < 10% of the time     Medical Problem List and Plan: 1. Decreased functional mobility secondary to left BKA 08/16/2015 after recent left popliteal angioplasty.  Begin CIR  Stump protector ordered 2. DVT Prophylaxis/Anticoagulation: Subcutaneous heparin. Monitor for any bleeding episodes. 3. Pain Management: Neurontin 300 mg twice a day, Robaxin and oxycodone as needed 4. Acute blood loss anemia.   Labs pending for today 5. Neuropsych: This patient is capable of making decisions on his own behalf. 6. Skin/Wound Care: Routine skin checks 7. Fluids/Electrolytes/Nutrition: Routine I&O's   Labs pending for today 8. Diabetes  mellitus with peripheral neuropathy.Hemoglobin A1c 7.0 area Lantus insulin 12 units daily, NovoLog 3 units 3 times a day.   Check blood sugars before meals and at bedtime and provide diabetic teaching 9. Hypertension. Coreg 3.125 mg twice a day, Lisinopril 20 mg twice a day, HCTZ 12.5 mg twice a day. Monitor decreased mobility 10. Hyperlipidemia. Zocor 11. History CVA 20/15 without residual. Continue low-dose aspirin 12. BPH. Flomax 0.4 mg daily.   PVRs pending 13. History of tobacco/alcohol abuse. Monitor for any signs of withdrawal. Provide counseling 14. Chronic renal insufficiency. Baseline creatinine 1.34.   Labs pending for today 15. Hyponetremia.   Labs pending for today 16. PVD: Cont meds 17. Hepatitis C: Avoid hepatotoxic meds. 18. SIRS:   Continued fevers on abx  Will consult ID  cont abx  Cont to monitor   LOS (Days) 1 A FACE TO FACE EVALUATION WAS PERFORMED  Alajah Witman Lorie Phenix 08/22/2015 8:34 AM

## 2015-08-22 NOTE — Progress Notes (Signed)
Patient information reviewed and entered into eRehab system by Gaither Biehn, RN, CRRN, PPS Coordinator.  Information including medical coding and functional independence measure will be reviewed and updated through discharge.    

## 2015-08-22 NOTE — Evaluation (Signed)
Physical Therapy Assessment and Plan  Patient Details  Name: Cory Weber MRN: [id number] Date of Birth: 27-Jul-1950  PT Diagnosis: Difficulty walking, Impaired sensation and Muscle weakness Rehab Potential: Good ELOS: 12-16 days.    Today's Date: 08/22/2015 PT Individual Time: 0800-0915 PT Individual Time Calculation (min): 75 min    Problem List:  Patient Active Problem List   Diagnosis Date Noted  . Pyrexia   . Amputation of left lower extremity below knee ([place]) 08/21/2015  . Abnormality of gait   . Phantom limb pain ([place])   . Anemia of chronic disease   . DM type 2 with diabetic peripheral neuropathy ([place])   . HLD (hyperlipidemia)   . BPH (benign prostatic hyperplasia)   . Benign essential HTN   . ETOH abuse   . CKD (chronic kidney disease)   . PVD (peripheral vascular disease) ([place])   . Chronic hepatitis C without hepatic coma ([place])   . History of CVA (cerebrovascular accident) without residual deficits   . Leukocytosis   . SIRS (systemic inflammatory response syndrome) (H[facility])   . Tachycardia   . Hyponatremia   . Diabetic wet gangrene of the foot ([place]) 08/15/2015  . AKI (acute kidney injury) ([place])   . Atherosclerosis of left lower extremity with ulceration of midfoot ([place])   . Left foot infection 07/23/2015  . Type 2 diabetes mellitus with vascular disease ([place]) 07/23/2015  . Normocytic normochromic anemia 07/23/2015  . Cellulitis of left foot 07/23/2015  . Cellulitis L foot 07/18/2015  . Diabetes mellitus with foot ulcer, without long-term current use of insulin ([place]) 07/18/2015  . Chronic kidney disease, stage 3 07/18/2015  . Essential hypertension 07/18/2015  . Cellulitis of foot 07/18/2015  . Dyspepsia 04/13/2014  . Acute blood loss anemia 12/01/2013  . Constipation 12/01/2013  . Radiculopathy of lumbar region 11/29/2013  . Acute right lumbar radiculopathy 11/26/2013  . S/P lumbar spinal fusion 11/25/2013  . Lumbar spinal stenosis 11/24/2013  . Right leg  weakness 11/23/2013  . PUD (peptic ulcer disease) 11/19/2012  . IDA (iron deficiency anemia) 07/10/2012  . Chronic hepatitis C ([place]) 05/13/2012  . Unspecified constipation 05/13/2012  . Peripheral vascular disease, unspecified ([place]) 01/21/2012  . Hepatitis C antibody test positive 12/03/2011  . Normocytic anemia 12/03/2011  . Melena 12/03/2011  . DM (diabetes mellitus), type 2, uncontrolled ([place]) 02/03/2011  . Accelerated hypertension 02/03/2011  . Hemiparesthesia 02/03/2011  . Polysubstance abuse 02/03/2011  . Tobacco abuse 02/03/2011    Past Medical History:  Past Medical History  Diagnosis Date  . Hypertension   . Hyperlipidemia   . Asthma   . PVD (peripheral vascular disease) ([place])   . Anemia   . Peripheral neuropathy ([place])   . Cellulitis and abscess of foot 07/2015  . Pneumonia X 1  . Type II diabetes mellitus ([place])   . Hepatitis C     states he was diagnosed in 2007 or 2007 while living in [place], [place]  . CVA (cerebral vascular accident) [facility]) 2015    denies residual on 08/15/2015   Past Surgical History:  Past Surgical History  Procedure Laterality Date  . Liver biopsy  2005    Done in [place], [place]. Chronic hepatitis with mild periportal inflammation, lobular unicellular necrosis and portal fibrosis. Grade 2, stage 1-2.  Marland Kitchen Colonoscopy with propofol N/A 09/03/2012    YCX:KGYJEHU polyp-removed as outlined above. Prominent internal hemorrhoids. Tubular adenoma  . Esophagogastroduodenoscopy (egd) with propofol N/A 09/03/2012    DJS:HFWYOV hernia. Gastric diverticulum.  Gastric ulcers with associated erosions. Duodenal erosions. Status post gastric biopsy. H.PYLORI gastritis   . Biopsy N/A 09/03/2012    Procedure: BIOPSY;  Surgeon: Daneil Dolin, MD;  Location: [facility];  Service: Endoscopy;  Laterality: N/A;  gastric and gastric mucosa  . Polypectomy N/A 09/03/2012    Procedure: POLYPECTOMY;  Surgeon: Daneil Dolin, MD;  Location: [facility];  Service: Endoscopy;  Laterality:  N/A;  cecal polyp  . Esophagogastroduodenoscopy (egd) with propofol N/A 12/03/2012    Dr. Gala Romney: gastric diverticulum, gastric erosions and scar. Previously noted gastric ulcer completed healed. Biopsy without H.pylori.   . Biopsy N/A 12/03/2012    Procedure: BIOPSY;  Surgeon: Daneil Dolin, MD;  Location: [facility];  Service: Endoscopy;  Laterality: N/A;  . Maximum access (mas)posterior lumbar interbody fusion (plif) 1 level N/A 11/25/2013    Procedure: FOR MAXIMUM ACCESS (MAS) POSTERIOR LUMBAR INTERBODY FUSION (PLIF) 1 LEVEL;  Surgeon: Eustace Moore, MD;  Location: [place] NEURO ORS;  Service: Neurosurgery;  Laterality: N/A;  FOR MAXIMUM ACCESS (MAS) POSTERIOR LUMBAR INTERBODY FUSION (PLIF) 1 LEVEL LUMBAR 3-4  . Peripheral vascular catheterization Left 08/01/2015    Procedure: Lower Extremity Angiography;  Surgeon: Serafina Mitchell, MD;  Location: [place] CV LAB;  Service: Cardiovascular;  Laterality: Left;  . Peripheral vascular catheterization N/A 08/01/2015    Procedure: Abdominal Aortogram;  Surgeon: Serafina Mitchell, MD;  Location: [place] CV LAB;  Service: Cardiovascular;  Laterality: N/A;  . Peripheral vascular catheterization N/A 08/08/2015    Procedure: Abdominal Aortogram w/Lower Extremity;  Surgeon: Serafina Mitchell, MD;  Location: [place] CV LAB;  Service: Cardiovascular;  Laterality: N/A;  . Peripheral vascular catheterization Left 08/08/2015    Procedure: Peripheral Vascular Balloon Angioplasty;  Surgeon: Serafina Mitchell, MD;  Location: [place] CV LAB;  Service: Cardiovascular;  Laterality: Left;  left popiteal artery, left peronealtrunk, left post tibial  . Tonsillectomy    . Back surgery    . Amputation Left 08/16/2015    Procedure: LEFT BELOW THE KNEE AMPUTATION ;  Surgeon: Serafina Mitchell, MD;  Location: [facility] OR;  Service: Vascular;  Laterality: Left;    Assessment & Plan Clinical Impression: Patient is a 65 y.o. right handed male with history of hypertension, tobacco abuse,  diabetes mellitus, hepatitis C, CVA 2015 without residual maintained on aspirin and chronic renal insufficiency with creatinine 1.34 and peripheral vascular disease. Patient lives alone and was independent with assistive device prior to admission. He has a personal care attendant 3 hours a day 7 days a week to help with meals groceries and cleaning. 1 level apartment with no steps entry Presented 08/15/2015 after recent left popliteal angioplasty one week ago and was discharged to home 08/10/2015 ambulating 100 feet with minimal assistance and noted a progressive Pain with gangrenous changes to the left foot. Patient noted while changing dressing with findings of maggots in the wound. The leg was deemed nonsalvageable and underwent left BKA 08/16/2015 per Dr. Trula Slade. [facility] course pain management.Pre-prosthetic training with [employer] prosthetics. Mild leukocytosis 12,300 Improved to 10,700 as well as low-grade fever 100.6 and monitored with IV antibiotics completed 08/18/2015 and addition of Doxycycline 08/19/2015 . Acute blood loss anemia 8.7 and monitored. Subcutaneous heparin for DVT prophylaxis. Patient transferred to CIR on 08/21/2015 .   Patient currently requires max with mobility secondary to muscle weakness, decreased cardiorespiratoy endurance and decreased standing balance and difficulty maintaining precautions.  Prior to hospitalization, patient was modified independent  with mobility and  lived with   in a [place] home.  Home access is 2Stairs to enter.  Patient will benefit from skilled PT intervention to maximize safe functional mobility, minimize fall risk and decrease caregiver burden for planned discharge home with intermittent assist.  Anticipate patient will benefit from follow up [facility] at discharge.  PT - End of Session Activity Tolerance: Tolerates 30+ min activity with multiple rests Endurance Deficit: Yes PT Assessment Rehab Potential (ACUTE/IP ONLY): Good Barriers to Discharge:  Inaccessible home environment;Decreased caregiver support PT Patient demonstrates impairments in the following area(s): Behavior;Balance;Endurance;Pain;Perception;Safety;Sensory PT Transfers Functional Problem(s): Bed Mobility;Bed to Chair;Car;Furniture;Floor PT Locomotion Functional Problem(s): Ambulation;Wheelchair Mobility;Stairs PT Plan PT Intensity: Minimum of 1-2 x/day ,45 to 90 minutes PT Frequency: 5 out of 7 days PT Duration Estimated Length of Stay: 12-16 days.  PT Treatment/Interventions: Ambulation/gait training;Balance/vestibular training;Cognitive remediation/compensation;Community reintegration;Discharge planning;Disease management/prevention;DME/adaptive equipment instruction;Functional mobility training;Neuromuscular re-education;Pain management;Patient/family education;Psychosocial support;Skin care/wound management;Splinting/orthotics;Stair training;Therapeutic Activities;Therapeutic Exercise;UE/LE Strength taining/ROM;UE/LE Coordination activities;Visual/perceptual remediation/compensation;Wheelchair propulsion/positioning PT Transfers Anticipated Outcome(s): Supervision A with LRAD PT Locomotion Anticipated Outcome(s): RW for household distances with supervision A. WC for community mobility.  PT Recommendation Follow Up Recommendations: Home health PT Patient destination: Home Equipment Recommended: Wheelchair (measurements);Wheelchair cushion (measurements)  Skilled Therapeutic Intervention  Patient received sitting EOB and agreeable to PT. MD present for start of PT Evaluation. PT performed Evaluation and initiated treatment; intrervention.  Patient performed Sit<>stand and bed WC transfers as listed below. Patient reports need to have bowel movement. Toilet transfer with Max A with stand pivot technique to elevated seat over toilet. PT was required to provide multimodal cues for safety and technique.   PT instructed patient in gait and WC mobility as indicated below and  attempted car transfer with stand/pivot without success due to increased UE/LE fatigue. Patient also instructed in SB transfer to car, but reports light headedness during transfer and returned to [facility]. BP Assessed 96/68 in WC. Patient returned to room and left sitting in [facility] with call bell in reach.      PT Evaluation Precautions/Restrictions Precautions Precautions: Fall Restrictions Weight Bearing Restrictions: Yes LLE Weight Bearing: Non weight bearing General Chart Reviewed: Yes Family/Caregiver Present: No Vital Signs Pain Pain Assessment Pain Assessment: No/denies pain (premedicated for therapy) Pain Score: 0-No pain Home Living/Prior Functioning Home Living Available Help at Discharge: Family Type of Home: Apartment Home Access: Stairs to enter Technical brewer of Steps: 2 Entrance Stairs-Rails: Right;Left Home Layout: One level Bathroom Shower/Tub: Multimedia programmer: Handicapped height Prior Function Level of Independence: Needs assistance with homemaking;Requires assistive device for independence  Able to Take Stairs?: Yes Vocation: Retired Comments: using rw Vision/Perception     Cognition Overall Cognitive Status: Impaired/Different from baseline Orientation Level: Oriented X4 Awareness: Impaired Problem Solving: Impaired Behaviors: Impulsive Safety/Judgment: Impaired Comments: Phantom sensation causes decreased safety.  Sensation Sensation Light Touch: Appears Intact Hot/Cold: Appears Intact Proprioception: Appears Intact Additional Comments: Phantom sensation.  Coordination Gross Motor Movements are Fluid and Coordinated: Yes Motor  Motor Motor: Other (comment) (Generalized strength deficits. )  Mobility Transfers Transfers: Yes Sit to Stand: 2: Max assist Sit to Stand Details: Verbal cues for sequencing;Verbal cues for technique;Verbal cues for gait pattern;Verbal cues for safe use of DME/AE;Tactile cues for weight  shifting;Tactile cues for placement;Tactile cues for weight beaing;Visual cues for safe use of DME/AE;Manual facilitation for weight shifting;Manual facilitation for placement Squat Pivot Transfers: 2: Max Risk manager Details: Verbal cues for sequencing;Verbal cues for technique;Verbal cues for precautions/safety;Manual facilitation for placement;Verbal cues for safe  use of DME/AE;Tactile cues for weight shifting;Tactile cues for placement Locomotion  Ambulation Ambulation: Yes Ambulation/Gait Assistance: 3: Mod assist Ambulation Distance (Feet): 2 Feet Assistive device: Rolling walker Ambulation/Gait Assistance Details: Tactile cues for sequencing;Tactile cues for weight shifting;Visual cues for safe use of DME/AE;Visual cues/gestures for precautions/safety;Visual cues/gestures for sequencing;Verbal cues for sequencing;Verbal cues for technique;Verbal cues for precautions/safety;Verbal cues for gait pattern;Verbal cues for safe use of DME/AE;Manual facilitation for weight shifting;Manual facilitation for placement Gait Gait: Yes Gait Pattern: Impaired Gait Pattern: Step-to pattern (swing to ) Stairs / Additional Locomotion Stairs: No Architect: Yes Wheelchair Assistance: 5: Investment banker, operational Details: Verbal cues for technique;Verbal cues for Information systems manager: Both upper extremities Wheelchair Parts Management: Needs assistance Distance: 177f  Trunk/Postural Assessment  Cervical Assessment Cervical Assessment: Within Functional Limits Thoracic Assessment Thoracic Assessment: Within Functional Limits Lumbar Assessment Lumbar Assessment: Within Functional Limits (slight posterior pelvic tilt. ) Postural Control Postural Control: Within Functional Limits  Balance Balance Balance Assessed: Yes Static Sitting Balance Static Sitting - Level of Assistance: 6: Modified independent (Device/Increase  time) Dynamic Sitting Balance Dynamic Sitting - Level of Assistance: 4: Min assist Static Standing Balance Static Standing - Level of Assistance: 3: Mod assist Dynamic Standing Balance Dynamic Standing - Level of Assistance: 2: Max assist Extremity Assessment      RLE Assessment RLE Assessment: Exceptions to [facility](ROM WFL, Strength grossly 4/5. ) LLE Assessment LLE Assessment: Exceptions to WFL LLE AROM (degrees) LLE Overall AROM Comments: Lackin ~5 degrees full knee extension  LLE Strength LLE Overall Strength Comments: Grossly 4/5 for hip movements. 3+/5 for knee flexion extension due to pain with MMT.    See Function Navigator for Current Functional Status.   Refer to Care Plan for Long Term Goals  Recommendations for other services: None  Discharge Criteria: Patient will be discharged from PT if patient refuses treatment 3 consecutive times without medical reason, if treatment goals not met, if there is a change in medical status, if patient makes no progress towards goals or if patient is discharged from hospital.  The above assessment, treatment plan, treatment alternatives and goals were discussed and mutually agreed upon: by patient  ALorie Phenix7/12/2015, 12:50 PM

## 2015-08-22 NOTE — Care Management Note (Signed)
Inpatient [facility] Individual Statement of Services  Patient Name:  Cory Weber  Date:  08/22/2015  Welcome to the [facility].  Our goal is to provide you with an individualized program based on your diagnosis and situation, designed to meet your specific needs.  With this comprehensive rehabilitation program, you will be expected to participate in at least 3 hours of rehabilitation therapies Monday-Friday, with modified therapy programming on the weekends.  Your rehabilitation program will include the following services:  Physical Therapy (PT), Occupational Therapy (OT), 24 hour per day rehabilitation nursing, Therapeutic Recreaction (TR), Neuropsychology, Case Management (Social Worker), Rehabilitation Medicine, Nutrition Services and Pharmacy Services  Weekly team conferences will be held on Tuesdays to discuss your progress.  Your Social Worker will talk with you frequently to get your input and to update you on team discussions.  Team conferences with you and your family in attendance may also be held.  Expected length of stay: 14-16 days              Overall anticipated outcome: supervision  Depending on your progress and recovery, your program may change. Your Social Worker will coordinate services and will keep you informed of any changes. Your Social Worker's name and contact numbers are listed  below.  The following services may also be recommended but are not provided by the [facility] will be made to provide these services after discharge if needed.  Arrangements include referral to agencies that provide these services.  Your insurance has been verified to be:  Medicaid Your primary doctor is:  Dr. Iona Beard  Pertinent information will be shared with your doctor and your  insurance company.  Social Worker:  [place], [place] or ([phone]   Information discussed with and copy given to patient by: Lennart Pall, 08/22/2015, 5:28 PM

## 2015-08-23 ENCOUNTER — Inpatient Hospital Stay (HOSPITAL_COMMUNITY): Payer: Medicaid Other | Admitting: Occupational Therapy

## 2015-08-23 ENCOUNTER — Inpatient Hospital Stay (HOSPITAL_COMMUNITY): Payer: Medicaid Other | Admitting: Physical Therapy

## 2015-08-23 DIAGNOSIS — D649 Anemia, unspecified: Secondary | ICD-10-CM

## 2015-08-23 DIAGNOSIS — E1159 Type 2 diabetes mellitus with other circulatory complications: Secondary | ICD-10-CM

## 2015-08-23 LAB — URINE CULTURE: CULTURE: NO GROWTH

## 2015-08-23 LAB — GLUCOSE, CAPILLARY
GLUCOSE-CAPILLARY: 208 mg/dL — AB (ref 65–99)
GLUCOSE-CAPILLARY: 196 mg/dL — AB (ref 65–99)
Glucose-Capillary: 143 mg/dL — ABNORMAL HIGH (ref 65–99)
Glucose-Capillary: 166 mg/dL — ABNORMAL HIGH (ref 65–99)

## 2015-08-23 NOTE — Progress Notes (Signed)
Physical Therapy Session Note  Patient Details  Name: Cory Weber MRN: [id number] Date of Birth: Mar 23, 1950  Today's Date: 08/23/2015 PT Individual Time: 1530-1610 PT Individual Time Calculation (min): 40 min   Short Term Goals: Week 1:  PT Short Term Goal 1 (Week 1): Patient will perform Squat pivot transfer with mod A.  PT Short Term Goal 2 (Week 1): Patient will ambulate with RW for 15 ft with mod A  PT Short Term Goal 3 (Week 1): Patient will perform WC mobillity for 150 ft  with supervision A PT Short Term Goal 4 (Week 1): Patient will initiate stair training.  Week 2:     Skilled Therapeutic Interventions/Progress Updates:    Patient received sitting in WC and agreeable to PT. PT transported patient to gym with total A for time management  PT instructed patient in squat pivot transfer x 2 with mod A and mod cues for UE placement and increased use of LLE to improve success and safety of transfer.  PT adjusted WC to increase seat height to improve ease of transfers to allow greater mechanical advantage with sit<>stand and squat pivot transfers.  Sit<>supine from EOB with supervision-min A.  Supine Therex with min cues for improved technique and increased ROM. All therex performed x 10 including Bridge with push from Thighs on bolster, SAQ BLE, SLR BLE, sidelying hip abduction and hip flexion.    With supine>sit patient reports orthostasis, BP 92/60. Symptomatic for 45 seconds.     Patient returned to room and Left in [facility] with call bell within reach.    Therapy Documentation Precautions:  Precautions Precautions: Fall Restrictions Weight Bearing Restrictions: Yes LLE Weight Bearing: Non weight bearing General:   Vital Signs: See above.   Pain: Pain Assessment Pain Assessment: 0-10 Pain Score: Asleep Pain Type: Acute pain Pain Location: Leg Pain Orientation: Left Pain Descriptors / Indicators: Aching Pain Onset: On-going Patients Stated Pain Goal: 2 Pain  Intervention(s): Medication (See eMAR)  See Function Navigator for Current Functional Status.   Therapy/Group: Individual Therapy  Lorie Phenix 08/23/2015, 6:50 PM

## 2015-08-23 NOTE — Progress Notes (Signed)
Social Work Patient ID: Cory Weber, male   DOB: 1950-02-14, 65 y.o.   MRN: [id number]   Spoke with pt's sister, Kenney Houseman, this morning to discuss support available to pt at home.  She reports that she is one of 3 sisters with two in [place] and one in [place].  She notes that they "all have health problems" and notes "we don't see him everyday..he's pretty set in his ways and doesn't want the advice."  She adds that she is hopeful the [facility] aide services might be increased in number of hours/ day (I am following up on this).  She also expresses concerns about the accessibility of his "apt".  She explains "It's not really an apt but a house divided up into 4 parts."  Also note there are 3+ STE.  Sister states, "He needs to be in a better place."  Will follow up with tx team and with pt about d/c support concerns as he will NOT have 24/7 assist available.  Continue to follow.  Herschel Fleagle, LCSW

## 2015-08-23 NOTE — Progress Notes (Signed)
Occupational Therapy Session Note  Patient Details  Name: Cory Weber MRN: [id number] Date of Birth: Apr 01, 1950  Today's Date: 08/23/2015 OT Individual Time: [id number] OT Individual Time Calculation (min): 42 min    Short Term Goals: Week 1:  OT Short Term Goal 1 (Week 1): Pt will perform toilet transfer with mod A in order to decrease level of assist with functional transfers.  OT Short Term Goal 2 (Week 1): Pt will perform shower transfer with mod A in order to decrease level of assist with functional transfers. OT Short Term Goal 3 (Week 1): Pt will perform LB dressing with min A in order to increase I with self care.  OT Short Term Goal 4 (Week 1): Pt will perform toileting with min A in order to increase I with self care.   Skilled Therapeutic Interventions/Progress Updates:    Upon entering the room, pt seated in wheelchair awaiting therapist with no c/o pain. Pt becomes very upset because he has lost cell phone. Pt is propelling wheelchair in room, opening drawers, pulling back covers and searching for missing item. NT arrives to room as someone has turned in pt's phone at RN station. Pt assists pt onto elevator and then pt propels self 150' outside of hospital with supervision and increased time.Pt needing supervision and min cues to propel wheelchair safely on various surfaces. Once outside, pt needing rest break secondary to fatigue. OT educating pt on energy conservation for self care and functional tasks. Pt verbalizing education but education to continue. OT propelled pt back to room secondary to time management. Pt remaining in wheelchair with call bell and all other needed items within reach upon exiting the room.   Therapy Documentation Precautions:  Precautions Precautions: Fall Restrictions Weight Bearing Restrictions: Yes LLE Weight Bearing: Non weight bearing General:   Vital Signs: Therapy Vitals Temp: 98.1 F (36.7 C) Temp Source: Oral Pulse Rate: 98 Resp:  18 BP: (!) 87/61 mmHg (RN Notified) Patient Position (if appropriate): Sitting Oxygen Therapy SpO2: 98 % O2 Device: Not Delivered Pain: Pain Assessment Pain Score: 0-No pain  See Function Navigator for Current Functional Status.   Therapy/Group: Individual Therapy  Cory Weber 08/23/2015, 5:16 PM

## 2015-08-23 NOTE — Progress Notes (Signed)
Occupational Therapy Session Note  Patient Details  Name: Cory Weber MRN: [id number] Date of Birth: [birth year]  Today's Date: 08/23/2015 OT Individual Time: 1300-1400 OT Individual Time Calculation (min): 60 min    Short Term Goals: Week 1:  OT Short Term Goal 1 (Week 1): Pt will perform toilet transfer with mod A in order to decrease level of assist with functional transfers.  OT Short Term Goal 2 (Week 1): Pt will perform shower transfer with mod A in order to decrease level of assist with functional transfers. OT Short Term Goal 3 (Week 1): Pt will perform LB dressing with min A in order to increase I with self care.  OT Short Term Goal 4 (Week 1): Pt will perform toileting with min A in order to increase I with self care.   Skilled Therapeutic Interventions/Progress Updates:    Pt seen for skilled OT session focusing on self care and functional mobility. Pt sitting with BSC over toilet upon arrival and agreeable to tx session. Pt completed hygiene performing lateral leans and stood with +2  to pull up pants at RW.  Pt transferred to w/c via squat pivot with mod A. During session pt requested to use restroom again and self propelled w/c to public restroom. Pt transferred to and from commode via squat pivot using grab bars +2.   Pt self propelled to therapy gym where OT educated on sliding board setup with pt initating placing board. Pt transferred from w/c to mat using sliding board with min A. OT educated on squat pivot transfers and pt demonstrated 3 squat pivot transfers with min-mod A and VC for technique. Pt denies pain at this time, but OT educated on residual limb pain and techniques to help with pain. OT suggested folding shrinker twice and educated on proper positioning and care. Pt self propelled w/c to room and left with all needs met. Throughout session pt slightly impulsive and requires frequent VC to listen and stay on task.   Therapy Documentation Precautions:   Precautions Precautions: Fall Restrictions Weight Bearing Restrictions: Yes LLE Weight Bearing: Non weight bearing Pain: Pt reports no pain  See Function Navigator for Current Functional Status.   Therapy/Group: Individual Therapy  Matilde Bash 08/23/2015, 3:21 PM

## 2015-08-23 NOTE — Progress Notes (Signed)
Physical Therapy Session Note  Patient Details  Name: Cory Weber MRN: [id number] Date of Birth: 10/03/1950  Today's Date: 08/23/2015 PT Individual Time: 0901-0958 PT Individual Time Calculation (min): 57 min   Short Term Goals: Week 1:  PT Short Term Goal 1 (Week 1): Patient will perform Squat pivot transfer with mod A.  PT Short Term Goal 2 (Week 1): Patient will ambulate with RW for 15 ft with mod A  PT Short Term Goal 3 (Week 1): Patient will perform WC mobillity for 150 ft  with supervision A PT Short Term Goal 4 (Week 1): Patient will initiate stair training.   Skilled Therapeutic Interventions/Progress Updates:    Pt presented in chair agreeable to therapy.  Pt denies pain at present.  Pt required mod assist for donning shorts with cues for lateral lean to facilitate.  Pt propelled 118f x3 with occasional brief rests 2/2 fatigue.  Min cues provided to use of RLE to facilitate propulsion and cues for negotiating objects.  Pt attempted sit to stand with RW, max cues for positioning and hand placement.  Pt however unable to achieve full standing.  Pt stating fatigue in UE, after therapeutic rest pt performed slide board transfer with cues for technique and initially requiring modA improving to minA with multiple trials.  Pt then performed tricep extention and bicpe curls 2x10 with 3# bilaterally and instructed in tricep curls with orange resistance band.  Pt able to propel 776fthen requiring assistance 2/2 fatigue.  Pt returned to room with call current needs met.   Therapy Documentation Precautions:  Precautions Precautions: Fall Restrictions Weight Bearing Restrictions: Yes LLE Weight Bearing: Non weight bearing General:   Vital Signs:   Pain: Pain Assessment Pain Score: 0-No pain Mobility:   See Function Navigator for Current Functional Status.   Therapy/Group: Individual Therapy  Talon Witting  Kiki Bivens, PTA  08/23/2015, 12:12 PM

## 2015-08-23 NOTE — Progress Notes (Signed)
[place] PHYSICAL MEDICINE & REHABILITATION     PROGRESS NOTE  Subjective/Complaints:  Pt sitting up in bed.  He had a good first day of therapies yesterday.   ROS: Denies CP, SOB, N/V/D.  Objective: Vital Signs: Blood pressure 108/64, pulse 99, temperature 98.7 F (37.1 C), temperature source Oral, resp. rate 18, height 6\' 1"  (1.854 m), weight 81.829 kg (180 lb 6.4 oz), SpO2 95 %. No results found.  Recent Labs  08/21/15 0329 08/22/15 0917  WBC 10.7* 11.0*  HGB 8.7* 8.7*  HCT 27.5* 28.0*  PLT 453* 498*    Recent Labs  08/21/15 0329  NA 132*  K 4.1  CL 95*  GLUCOSE 163*  BUN 32*  CREATININE 1.42*  CALCIUM 9.4   CBG (last 3)   Recent Labs  08/22/15 1638 08/22/15 2059 08/23/15 0706  GLUCAP 199* 168* 208*    Wt Readings from Last 3 Encounters:  08/21/15 81.829 kg (180 lb 6.4 oz)  08/15/15 90.3 kg (199 lb 1.2 oz)  07/23/15 90.266 kg (199 lb)    Physical Exam:  BP 108/64 mmHg  Pulse 99  Temp(Src) 98.7 F (37.1 C) (Oral)  Resp 18  Ht 6\' 1"  (1.854 m)  Wt 81.829 kg (180 lb 6.4 oz)  BMI 23.81 kg/m2  SpO2 95% Constitutional: He appears well-developed and well-nourished.  HENT: Normocephalic and atraumatic.  Cardiovascular: Normal rate and regular rhythm.  Respiratory: Effort normal and breath sounds normal. No respiratory distress.  GI: Soft. Bowel sounds are normal. He exhibits no distension.  Musculoskeletal: He exhibits edema and tenderness.  Neurological: He is alert and oriented.  Motor: B/l UE, RLE: 5/5 proximal to distal LLE: hip flexion 4+/5  Skin: Skin is warm and dry.  Amputation site with mild sanguinous drainage along lateral staples lines Psychiatric: He has a normal mood and affect. His behavior is normal.   Assessment/Plan: 1. Functional deficits secondary to to left BKA 08/16/2015 after recent left popliteal angioplasty which require 3+ hours per day of interdisciplinary therapy in a comprehensive inpatient rehab  setting. Physiatrist is providing close team supervision and 24 hour management of active medical problems listed below. Physiatrist and rehab team continue to assess barriers to discharge/monitor patient progress toward functional and medical goals.  Function:  Bathing Bathing position   Position: Sitting EOB  Bathing parts Body parts bathed by patient: Right arm, Left arm, Chest, Abdomen, Front perineal area, Buttocks, Right upper leg, Left upper leg, Right lower leg Body parts bathed by helper: Back  Bathing assist Assist Level: Touching or steadying assistance(Pt > 75%)      Upper Body Dressing/Undressing Upper body dressing   What is the patient wearing?: Pull over shirt/dress     Pull over shirt/dress - Perfomed by patient: Thread/unthread right sleeve, Thread/unthread left sleeve, Put head through opening, Pull shirt over trunk          Upper body assist Assist Level: Set up   Set up : To obtain clothing/put away  Lower Body Dressing/Undressing Lower body dressing   What is the patient wearing?: Underwear Underwear - Performed by patient: Thread/unthread right underwear leg, Thread/unthread left underwear leg, Pull underwear up/down                            Lower body assist Assist for lower body dressing: Set up   Set up : To obtain clothing/put away  Toileting Toileting Toileting activity did not occur: No continent bowel/bladder  event   Toileting steps completed by helper: Adjust clothing prior to toileting, Performs perineal hygiene, Adjust clothing after toileting Toileting Assistive Devices: Grab bar or rail, Toilet aid  Toileting assist Assist level: Two helpers   Transfers Chair/bed transfer   Chair/bed transfer method: Squat pivot Chair/bed transfer assist level: Maximal assist (Pt 25 - 49%/lift and lower) Chair/bed transfer assistive device: Armrests     Locomotion Ambulation     Max distance: 25ft Assist level: Moderate assist (Pt 50  - 74%)   Wheelchair   Type: Manual Max wheelchair distance: 125 Assist Level: Supervision or verbal cues  Cognition Comprehension Comprehension assist level: Understands basic 90% of the time/cues < 10% of the time  Expression Expression assist level: Expresses basic needs/ideas: With no assist  Social Interaction Social Interaction assist level: Interacts appropriately 75 - 89% of the time - Needs redirection for appropriate language or to initiate interaction.  Problem Solving Problem solving assist level: Solves basic problems with no assist  Memory Memory assist level: Recognizes or recalls 75 - 89% of the time/requires cueing 10 - 24% of the time     Medical Problem List and Plan: 1. Decreased functional mobility secondary to left BKA 08/16/2015 after recent left popliteal angioplasty.  Cont CIR  Stump protector ordered 2. DVT Prophylaxis/Anticoagulation: Subcutaneous heparin. Monitor for any bleeding episodes. 3. Pain Management: Neurontin 300 mg twice a day, Robaxin and oxycodone as needed 4. Acute blood loss anemia.   Hb 8.7 on 7/11 (stable)  Cont to monitor 5. Neuropsych: This patient is capable of making decisions on his own behalf. 6. Skin/Wound Care: Routine skin checks 7. Fluids/Electrolytes/Nutrition: Routine I&O's  8. Diabetes mellitus with peripheral neuropathy.Hemoglobin A1c 7.0 area Lantus insulin 12 units daily, NovoLog 3 units 3 times a day.   Check blood sugars before meals and at bedtime and provide diabetic teaching 9. Hypertension. Coreg 3.125 mg twice a day, Lisinopril 20 mg twice a day, HCTZ 12.5 mg twice a day. Monitor decreased mobility 10. Hyperlipidemia. Zocor 11. History CVA 20/15 without residual. Continue low-dose aspirin 12. BPH. Flomax 0.4 mg daily.   PVRs pending 13. History of tobacco/alcohol abuse. Monitor for any signs of withdrawal. Provide counseling 14. Chronic renal insufficiency. Baseline creatinine 1.34.   Cr. 1.42 on 7/10  Cont to  monitor 15. Hyponetremia.   Na+ 132 on 7/11  Cont to monitor 16. PVD: Cont meds 17. Hepatitis C: Avoid hepatotoxic meds. 18. SIRS:   Continued fevers on abx  Consulted ID.  Following.  Appreciate recs.  cont abx  Cont to monitor  LOS (Days) 2 A FACE TO FACE EVALUATION WAS PERFORMED  Amaad Byers Lorie Phenix 08/23/2015 8:17 AM

## 2015-08-23 NOTE — Progress Notes (Signed)
Patient ID: Cory Weber, male   DOB: 23-May-1950, 65 y.o.   MRN: [id number]         [facility] for Infectious Disease    Date of Admission:  08/21/2015   Total days of antibiotics 8        Day 5 doxycycline         Active Problems:   Pyrexia   Left foot infection   Amputation of left lower extremity below knee (HCC)   Normocytic anemia   Essential hypertension   Type 2 diabetes mellitus with vascular disease (HCC)   Chronic hepatitis C without hepatic coma (HCC)   HLD (hyperlipidemia)   CKD (chronic kidney disease)   PVD (peripheral vascular disease) ([place])   . acidophilus  2 capsule Oral Daily  . aspirin EC  81 mg Oral Daily  . carvedilol  3.125 mg Oral BID WC  . doxycycline  100 mg Oral Q12H  . famotidine  20 mg Oral Daily  . gabapentin  300 mg Oral BID  . heparin  5,000 Units Subcutaneous Q8H  . lisinopril  20 mg Oral BID   And  . hydrochlorothiazide  12.5 mg Oral BID  . insulin aspart  0-9 Units Subcutaneous TID WC  . insulin aspart  3 Units Subcutaneous TID WC  . insulin glargine  12 Units Subcutaneous Daily  . polyethylene glycol  17 g Oral Daily  . senna-docusate  2 tablet Oral BID  . simvastatin  20 mg Oral q1800  . tamsulosin  0.4 mg Oral Daily  . thiamine  100 mg Oral Daily    SUBJECTIVE: He is feeling well. He is eager to work hard on his physical therapy.  Review of Systems: Review of Systems  Constitutional: Negative for fever, chills and diaphoresis.  Musculoskeletal: Positive for joint pain.    Past Medical History  Diagnosis Date  . Hypertension   . Hyperlipidemia   . Asthma   . PVD (peripheral vascular disease) ([place])   . Anemia   . Peripheral neuropathy ([place])   . Cellulitis and abscess of foot 07/2015  . Pneumonia X 1  . Type II diabetes mellitus ([place])   . Hepatitis C     states he was diagnosed in 2007 or 2007 while living in [place], [place]  . CVA (cerebral vascular accident) [facility]) 2015    denies residual on 08/15/2015     Social History  Substance Use Topics  . Smoking status: Former Smoker -- 1.00 packs/day for 47 years    Types: Cigarettes    Quit date: 07/13/2015  . Smokeless tobacco: Never Used  . Alcohol Use: 0.0 oz/week    0 Standard drinks or equivalent per week     Comment: 08/15/2015 "stopped drinking 3-4 years ago"    Family History  Problem Relation Age of Onset  . Breast cancer Mother     deceased  . Cancer Mother   . Diabetes Father   . Hypertension Father   . Heart disease Father     deceased  . Hyperlipidemia Father   . Diabetes Sister   . Hypertension Sister   . Breast cancer Sister   . Colon cancer Neg Hx   . Liver disease Neg Hx   . Heart failure Mother    No Known Allergies  OBJECTIVE: Filed Vitals:   08/22/15 0554 08/22/15 1426 08/22/15 2058 08/23/15 0503  BP: 118/64 98/63 114/71 108/64  Pulse: 98 97  99  Temp: 100.5 F (38.1 C) 98.7  F (37.1 C)  98.7 F (37.1 C)  TempSrc: Oral Oral  Oral  Resp: 18 18    Height:      Weight:      SpO2: 96% 100%  95%   Body mass index is 23.81 kg/(m^2).  Physical Exam  Constitutional: He is oriented to person, place, and time.  He is seated in his wheelchair just having returned from physical therapy.  Cardiovascular: Normal rate and regular rhythm.   No murmur heard. Pulmonary/Chest: Effort normal and breath sounds normal.  Abdominal: Soft. There is no tenderness.  Musculoskeletal:  His nurse reports that there was a scant amount of bloody drainage from the medial portion of his incision but otherwise it looked good and there was no evidence of active infection.  Neurological: He is alert and oriented to person, place, and time.  Skin: No rash noted.  Psychiatric: Mood and affect normal.    Lab Results Lab Results  Component Value Date   WBC 11.0* 08/22/2015   HGB 8.7* 08/22/2015   HCT 28.0* 08/22/2015   MCV 87.5 08/22/2015   PLT 498* 08/22/2015    Lab Results  Component Value Date   CREATININE 1.42*  08/21/2015   BUN 32* 08/21/2015   NA 132* 08/21/2015   K 4.1 08/21/2015   CL 95* 08/21/2015   CO2 28 08/21/2015    Lab Results  Component Value Date   ALT 15* 08/21/2015   AST 21 08/21/2015   ALKPHOS 49 08/21/2015   BILITOT 0.4 08/21/2015     Microbiology: Recent Results (from the past 240 hour(s))  Blood culture (routine x 2)     Status: None   Collection Time: 08/15/15  1:02 AM  Result Value Ref Range Status   Specimen Description BLOOD RIGHT ARM  Final   Special Requests BOTTLES DRAWN AEROBIC AND ANAEROBIC 5ML  Final   Culture NO GROWTH 5 DAYS  Final   Report Status 08/20/2015 FINAL  Final  Blood culture (routine x 2)     Status: None   Collection Time: 08/15/15  1:07 AM  Result Value Ref Range Status   Specimen Description BLOOD RIGHT HAND  Final   Special Requests BOTTLES DRAWN AEROBIC AND ANAEROBIC 5ML  Final   Culture NO GROWTH 5 DAYS  Final   Report Status 08/20/2015 FINAL  Final  Surgical pcr screen     Status: None   Collection Time: 08/15/15  4:56 AM  Result Value Ref Range Status   MRSA, PCR NEGATIVE NEGATIVE Final   Staphylococcus aureus NEGATIVE NEGATIVE Final    Comment:        The Xpert SA Assay (FDA approved for NASAL specimens in patients over 5 years of age), is one component of a comprehensive surveillance program.  Test performance has been validated by [facility] for patients greater than or equal to 70 year old. It is not intended to diagnose infection nor to guide or monitor treatment.   Culture, blood (single)     Status: None   Collection Time: 08/16/15 10:55 PM  Result Value Ref Range Status   Specimen Description BLOOD RIGHT ANTECUBITAL  Final   Special Requests BOTTLES DRAWN AEROBIC AND ANAEROBIC 5CC   Final   Culture NO GROWTH 5 DAYS  Final   Report Status 08/21/2015 FINAL  Final  Culture, Urine     Status: None   Collection Time: 08/22/15 10:26 AM  Result Value Ref Range Status   Specimen Description URINE, CLEAN CATCH  Final  Special Requests NONE  Final   Culture NO GROWTH  Final   Report Status 08/23/2015 FINAL  Final     ASSESSMENT: He has not had any recent fevers and there is no clinical indication of active infection. I will consider stopping doxycycline tomorrow.  PLAN: 1. Consider stopping doxycycline tomorrow  Michel Bickers, MD [facility] for [place] [phone] pager   [phone] cell 08/23/2015, 1:05 PM

## 2015-08-24 ENCOUNTER — Inpatient Hospital Stay (HOSPITAL_COMMUNITY): Payer: Medicaid Other | Admitting: Occupational Therapy

## 2015-08-24 ENCOUNTER — Inpatient Hospital Stay (HOSPITAL_COMMUNITY): Payer: Medicaid Other | Admitting: Physical Therapy

## 2015-08-24 ENCOUNTER — Encounter: Payer: Self-pay | Admitting: Surgery

## 2015-08-24 DIAGNOSIS — I739 Peripheral vascular disease, unspecified: Secondary | ICD-10-CM

## 2015-08-24 LAB — GLUCOSE, CAPILLARY
GLUCOSE-CAPILLARY: 69 mg/dL (ref 65–99)
GLUCOSE-CAPILLARY: 90 mg/dL (ref 65–99)
GLUCOSE-CAPILLARY: 144 mg/dL — AB (ref 65–99)
Glucose-Capillary: 178 mg/dL — ABNORMAL HIGH (ref 65–99)
Glucose-Capillary: 171 mg/dL — ABNORMAL HIGH (ref 65–99)

## 2015-08-24 MED ORDER — INSULIN ASPART 100 UNIT/ML ~~LOC~~ SOLN
5.0000 [IU] | Freq: Three times a day (TID) | SUBCUTANEOUS | Status: DC
  Administered 2015-08-24 – 2015-09-05 (×32): 5 [IU] via SUBCUTANEOUS

## 2015-08-24 NOTE — Progress Notes (Addendum)
  Progress Note    08/24/2015 9:24 AM * No surgery found *  Subjective:  No complaints; wants to know if his leg is infected; doing well with rehab  Afebrile VSS   Filed Vitals:   08/24/15 0914 08/24/15 0915  BP: 106/76 106/76  Pulse:  104  Temp:    Resp:      Physical Exam: Incisions:  Left BKA stump looks good.  A couple of steri strips removed and there is some serosanguinous drainage from medial and middle portion.  Otherwise, it is clean and dry Extremities:  Moving LLE very well  CBC    Component Value Date/Time   WBC 11.0* 08/22/2015 0917   RBC 3.20* 08/22/2015 0917   RBC 4.06* 11/24/2013 0335   HGB 8.7* 08/22/2015 0917   HCT 28.0* 08/22/2015 0917   HCT 32 10/31/2011 1319   PLT 498* 08/22/2015 0917   MCV 87.5 08/22/2015 0917   MCV 82.8 10/31/2011 1319   MCH 27.2 08/22/2015 0917   MCHC 31.1 08/22/2015 0917   RDW 14.4 08/22/2015 0917   LYMPHSABS 2.4 08/22/2015 0917   MONOABS 0.8 08/22/2015 0917   EOSABS 0.3 08/22/2015 0917   BASOSABS 0.0 08/22/2015 0917    BMET    Component Value Date/Time   NA 132* 08/21/2015 0329   NA 140 10/31/2011 1319   K 4.1 08/21/2015 0329   K 4.0 10/31/2011 1319   CL 95* 08/21/2015 0329   CO2 28 08/21/2015 0329   GLUCOSE 163* 08/21/2015 0329   BUN 32* 08/21/2015 0329   BUN 10 10/31/2011 1319   CREATININE 1.42* 08/21/2015 0329   CREATININE 0.99 10/31/2011 1319   CALCIUM 9.4 08/21/2015 0329   CALCIUM 9.0 10/31/2011 1319   GFRNONAA 51* 08/21/2015 0329   GFRAA 60* 08/21/2015 0329    INR    Component Value Date/Time   INR 1.35 08/08/2015 0243     Intake/Output Summary (Last 24 hours) at 08/24/15 0924 Last data filed at 08/24/15 0831  Gross per 24 hour  Intake    460 ml  Output   1100 ml  Net   -640 ml     Assessment/Plan:  65 y.o. male is s/p left below knee amputation  8 Days Post-Op  -stump is viable -there is some mild serosanguinous drainage from the medial and central portion of the stump.  I tried to  express some drainage from these two areas, but was very minimal.  Pt is afebrile -steri strips re-applied and ace wrap applied. -continue to monitor.    Leontine Locket, PA-C [facility] [phone] 08/24/2015 9:24 AM           I have examined the patient, reviewed and agree with above. Stump examined. Does have very nice healing. There was an area of oozing from the medial aspect. Agree with Dr. Hale Bogus assessment from infectious disease. No evidence of infection feel comfortable discontinuing antibiotics. Will see again next week. Please call over the weekend if there are vascular concerns  Curt Jews, MD 08/25/2015 3:23 PM

## 2015-08-24 NOTE — Progress Notes (Signed)
Patient ID: Cory Weber, male   DOB: [birth year], 65 y.o.   MRN: [id number]         [facility] for Infectious Disease    Date of Admission:  08/21/2015   Total days of antibiotics 9        Day 6 doxycycline  He has had a small amount of serosanguineous drainage from his left BKA stump but no cellulitis or fluctuance to suggest infection. He received over 3 weeks of IV antibiotics last month and is now had 9 days of postoperative antibiotic therapy. He has not had any more fever. I will stop his doxycycline now. I will sign off but please call if I can be of further assistance while he is here.         Michel Bickers, MD [facility] for Infectious [place] [facility] [phone] pager   [phone] cell 02/14/2015, 1:32 PM

## 2015-08-24 NOTE — Progress Notes (Signed)
Hypoglycemic Event  CBG:69  Treatment: 15 GM carbohydrate snack  Symptoms: Shaky  Follow-up CBG: Time:1722 CBG Result:90  Possible Reasons for Event: Unknown  Comments/MD notified: Called Pam Love, PA wants to ensure patient gets a HS snack.     [place], Simon Rhein

## 2015-08-24 NOTE — Progress Notes (Signed)
Physical Therapy Session Note  Patient Details  Name: Cory Weber MRN: [id number] Date of Birth: [birth year]  Today's Date: 08/24/2015 PT Individual Time: 1616-1700 PT Individual Time Calculation (min): 44 min   Short Term Goals: Week 1:  PT Short Term Goal 1 (Week 1): Patient will perform Squat pivot transfer with mod A.  PT Short Term Goal 2 (Week 1): Patient will ambulate with RW for 15 ft with mod A  PT Short Term Goal 3 (Week 1): Patient will perform WC mobillity for 150 ft  with supervision A PT Short Term Goal 4 (Week 1): Patient will initiate stair training.   Skilled Therapeutic Interventions/Progress Updates:    Received Asleep in bed and aroused with effort. Patient performed squat pivot to bed with max A from PT.   Gait in Parallel bars x 10 ftx2 with mod A. Max cues for posture and improved shoulder activation to allow swing-to gait pattern  Nustep x 10 minutes with 3 rest breaks. Level 5-6 with min cues for decreased SPM to prevent excessive fatigue.    Sit<>stand in parallel bars x 3. 2 with mod A, 1 with min A. Patient pulled to stand with parallel bars.   Squat pivot x 6 throughout therapy with max A progressing to min A with increased arousal at end of session. Patient required min cues for UE placement and improved head hips relationship.   Patient left sitting on EOB with call bell within reach and bed alarm set.     Therapy Documentation Precautions:  Precautions Precautions: Fall Restrictions Weight Bearing Restrictions: Yes LLE Weight Bearing: Non weight bearing General:   Vital Signs: Therapy Vitals Temp: 98.1 F (36.7 C) Temp Source: Oral Pulse Rate: (!) 112 Resp: 18 BP: 105/65 mmHg Patient Position (if appropriate): Sitting Oxygen Therapy SpO2: 100 % O2 Device: Not Delivered PAIN: 0/10   See Function Navigator for Current Functional Status.   Therapy/Group: Individual Therapy  Lorie Phenix 08/24/2015, 6:34 PM

## 2015-08-24 NOTE — Progress Notes (Signed)
Occupational Therapy Session Note  Patient Details  Name: Cory Weber MRN: [id number] Date of Birth: 22-May-1950  Today's Date: 08/24/2015 OT Individual Time: 1500-1600 OT Individual Time Calculation (min): 60 min    Short Term Goals: Week 1:  OT Short Term Goal 1 (Week 1): Pt will perform toilet transfer with mod A in order to decrease level of assist with functional transfers.  OT Short Term Goal 2 (Week 1): Pt will perform shower transfer with mod A in order to decrease level of assist with functional transfers. OT Short Term Goal 3 (Week 1): Pt will perform LB dressing with min A in order to increase I with self care.  OT Short Term Goal 4 (Week 1): Pt will perform toileting with min A in order to increase I with self care.   Skilled Therapeutic Interventions/Progress Updates:    Pt seen for OT session focusing on w/c mobility and functional standing balance/ endurance. Pt sitting up in w/c upon arrival, agreeable to tx session. He self propelled w/c throughout unit with min cuing provided for w/c management/ turning techniques.  In therapy day room, pt propelled w/c  Throughout day room, required to pick up washclothes off floor utilizing reacher in simulation of homemaking task with VCs for technique.  Attempted to complete table top game in standing, requiring max A to stand at table and then at RW, unable to maintain static standing balance without B UE support and thus activity terminated. He completed laundry task from w/c level in unit laundry room, utilizing reacher to assist with managing controls. He returned to room to practice toilet transfers. Pt reports he has been "scooting" onto toilet with nursing staff. Educated regarding risks of skin breakdown and safety with sliding motion. Educated regarding squat pivot transfer method and completed squat pivot w/c <> BSC over toilet with min A when transferring to his R, requiring increased assist when transferring to weaker L side.   He requested return to bed at end of session, min squat pivot completed to bed. Pt left in supine with all needs in reach and bed alarm on.   Therapy Documentation Precautions:  Precautions Precautions: Fall Restrictions Weight Bearing Restrictions: Yes LLE Weight Bearing: Non weight bearing Pain:   No/ denies pain  See Function Navigator for Current Functional Status.   Therapy/Group: Individual Therapy  Lewis, Coralie Stanke C 08/24/2015, 6:39 AM

## 2015-08-24 NOTE — Progress Notes (Signed)
[facility] PHYSICAL MEDICINE & REHABILITATION     PROGRESS NOTE  Subjective/Complaints:  Pt laying in bed.  He slept well overnight.  He asks if his leg is infected.     ROS: Denies CP, SOB, N/V/D.  Objective: Vital Signs: Blood pressure 112/75, pulse 96, temperature 98.6 F (37 C), temperature source Oral, resp. rate 18, height 6\' 1"  (1.854 m), weight 81.829 kg (180 lb 6.4 oz), SpO2 98 %. No results found.  Recent Labs  08/22/15 0917  WBC 11.0*  HGB 8.7*  HCT 28.0*  PLT 498*   No results for input(s): NA, K, CL, GLUCOSE, BUN, CREATININE, CALCIUM in the last 72 hours.  Invalid input(s): CO CBG (last 3)   Recent Labs  08/23/15 1625 08/23/15 2020 08/24/15 0655  GLUCAP 143* 166* 178*    Wt Readings from Last 3 Encounters:  08/21/15 81.829 kg (180 lb 6.4 oz)  08/15/15 90.3 kg (199 lb 1.2 oz)  07/23/15 90.266 kg (199 lb)    Physical Exam:  BP 112/75 mmHg  Pulse 96  Temp(Src) 98.6 F (37 C) (Oral)  Resp 18  Ht 6\' 1"  (1.854 m)  Wt 81.829 kg (180 lb 6.4 oz)  BMI 23.81 kg/m2  SpO2 98% Constitutional: He appears well-developed and well-nourished.  HENT: Normocephalic and atraumatic.  Cardiovascular: Normal rate and regular rhythm.  Respiratory: Effort normal and breath sounds normal. No respiratory distress.  GI: Soft. Bowel sounds are normal. He exhibits no distension.  Musculoskeletal: He exhibits edema and mild tenderness.  Neurological: He is alert and oriented.  Motor: B/l UE, RLE: 5/5 proximal to distal LLE: hip flexion 4+/5  Skin: Skin is warm and dry.  Amputation site with mild serosanguinous drainage along lateral side Psychiatric: He has a normal mood and affect. His behavior is normal.   Assessment/Plan: 1. Functional deficits secondary to to left BKA 08/16/2015 after recent left popliteal angioplasty which require 3+ hours per day of interdisciplinary therapy in a comprehensive inpatient rehab setting. Physiatrist is providing close team  supervision and 24 hour management of active medical problems listed below. Physiatrist and rehab team continue to assess barriers to discharge/monitor patient progress toward functional and medical goals.  Function:  Bathing Bathing position   Position: Sitting EOB  Bathing parts Body parts bathed by patient: Right arm, Left arm, Chest, Abdomen, Front perineal area, Buttocks, Right upper leg, Left upper leg, Right lower leg Body parts bathed by helper: Back  Bathing assist Assist Level: Touching or steadying assistance(Pt > 75%)      Upper Body Dressing/Undressing Upper body dressing   What is the patient wearing?: Pull over shirt/dress     Pull over shirt/dress - Perfomed by patient: Thread/unthread right sleeve, Thread/unthread left sleeve, Put head through opening, Pull shirt over trunk          Upper body assist Assist Level: Set up   Set up : To obtain clothing/put away  Lower Body Dressing/Undressing Lower body dressing   What is the patient wearing?: Underwear Underwear - Performed by patient: Thread/unthread right underwear leg, Thread/unthread left underwear leg, Pull underwear up/down                            Lower body assist Assist for lower body dressing: Set up   Set up : To obtain clothing/put away  Toileting Toileting Toileting activity did not occur: No continent bowel/bladder event Toileting steps completed by patient: Performs perineal hygiene Toileting steps  completed by helper: Adjust clothing prior to toileting, Adjust clothing after toileting Toileting Assistive Devices: Grab bar or rail  Toileting assist Assist level: Two helpers   Transfers Chair/bed transfer   Chair/bed transfer method: Squat pivot Chair/bed transfer assist level: Moderate assist (Pt 50 - 74%/lift or lower) Chair/bed transfer assistive device: Armrests     Locomotion Ambulation     Max distance: 101ft Assist level: Moderate assist (Pt 50 - 74%)   Wheelchair    Type: Manual Max wheelchair distance: 150 Assist Level: Supervision or verbal cues  Cognition Comprehension Comprehension assist level: Understands complex 90% of the time/cues 10% of the time  Expression Expression assist level: Expresses basic needs/ideas: With no assist  Social Interaction Social Interaction assist level: Interacts appropriately 90% of the time - Needs monitoring or encouragement for participation or interaction.  Problem Solving Problem solving assist level: Solves complex 90% of the time/cues < 10% of the time  Memory Memory assist level: Recognizes or recalls 90% of the time/requires cueing < 10% of the time     Medical Problem List and Plan: 1. Decreased functional mobility secondary to left BKA 08/16/2015 after recent left popliteal angioplasty.  Cont CIR  Stump protector ordered 2. DVT Prophylaxis/Anticoagulation: Subcutaneous heparin. Monitor for any bleeding episodes. 3. Pain Management: Neurontin 300 mg twice a day, Robaxin and oxycodone as needed 4. Acute blood loss anemia.   Hb 8.7 on 7/11 (stable)  Cont to monitor 5. Neuropsych: This patient is capable of making decisions on his own behalf. 6. Skin/Wound Care: Routine skin checks 7. Fluids/Electrolytes/Nutrition: Routine I&O's  8. Diabetes mellitus with peripheral neuropathy.Hemoglobin A1c 7.0   Lantus insulin 12 units daily  NovoLog 3 units 3 times a day, increased to 5U on 7/13.   Check blood sugars before meals and at bedtime and provide diabetic teaching 9. Hypertension. Coreg 3.125 mg twice a day, Lisinopril 20 mg twice a day, HCTZ 12.5 mg twice a day. Monitor decreased mobility 10. Hyperlipidemia. Zocor 11. History CVA 20/15 without residual. Continue low-dose aspirin 12. BPH. Flomax 0.4 mg daily.   PVRs pending 13. History of tobacco/alcohol abuse. Monitor for any signs of withdrawal. Provide counseling 14. Chronic renal insufficiency. Baseline creatinine 1.34.   Cr. 1.42 on 7/10  Cont to  monitor 15. Hyponetremia.   Na+ 132 on 7/11  Cont to monitor 16. PVD: Cont meds 17. Hepatitis C: Avoid hepatotoxic meds. 18. SIRS:   Currently Afebrile  Consulted ID.  Following.  Appreciate recs.  ?D/c abx today  Cont to monitor  LOS (Days) 3 A FACE TO FACE EVALUATION WAS PERFORMED  Ankit Lorie Phenix 08/24/2015 8:19 AM

## 2015-08-24 NOTE — Progress Notes (Signed)
Occupational Therapy Session Note  Patient Details  Name: Cory Weber MRN: [id number] Date of Birth: 1950/08/20  Today's Date: 08/24/2015 OT Individual Time: 1015-1100 OT Individual Time Calculation (min): 45 min    Short Term Goals: Week 1:  OT Short Term Goal 1 (Week 1): Pt will perform toilet transfer with mod A in order to decrease level of assist with functional transfers.  OT Short Term Goal 2 (Week 1): Pt will perform shower transfer with mod A in order to decrease level of assist with functional transfers. OT Short Term Goal 3 (Week 1): Pt will perform LB dressing with min A in order to increase I with self care.  OT Short Term Goal 4 (Week 1): Pt will perform toileting with min A in order to increase I with self care.   Skilled Therapeutic Interventions/Progress Updates:    1:1 Self care retraining at sink level with focus on w/c mobility to obtain items for self care. Pt able to perform sit to stands (5 times) at sink with mod A with mod VC for proper hand placement and trunk posture. Min A for balance in standing to perform peri/ bottom hygiene and clothing management. Pt had a lot of questions regarding prosthesis and process of getting one.  Provided education using the First Step book and prosthetic manual in his room. clarified questions. Education purpose of limb guard and basic care for residual limb at this stage of recovery.   Therapy Documentation Precautions:  Precautions Precautions: Fall Restrictions Weight Bearing Restrictions: Yes LLE Weight Bearing: Non weight bearing Pain: No c/o pain  See Function Navigator for Current Functional Status.   Therapy/Group: Individual Therapy  Willeen Cass [facility] 08/24/2015, 3:34 PM

## 2015-08-24 NOTE — Progress Notes (Signed)
Physical Therapy Session Note  Patient Details  Name: Cory Weber MRN: [id number] Date of Birth: 10-08-1950  Today's Date: 08/24/2015 PT Individual Time: 0920-1000 PT Individual Time Calculation (min): 40 min   Short Term Goals: Week 1:  PT Short Term Goal 1 (Week 1): Patient will perform Squat pivot transfer with mod A.  PT Short Term Goal 2 (Week 1): Patient will ambulate with RW for 15 ft with mod A  PT Short Term Goal 3 (Week 1): Patient will perform WC mobillity for 150 ft  with supervision A PT Short Term Goal 4 (Week 1): Patient will initiate stair training.   Skilled Therapeutic Interventions/Progress Updates:   Patient missed first 5 min due to nursing care/dressing change. Patient transferred to EOB using rail with supervision and performed squat pivot transfer bed > wheelchair with mod A and max verbal cues for hand placement, anterior weight shift, and technique. Patient donned leg rests with extra time and supervision and with max verbal cues for technique with multiple attempts and propelled wheelchair using BUE in controlled environment x 150 ft with supervision and increased time with verbal/visual cues for efficient propulsion technique. Performed sit <> stand x 3 in parallel bars with mod A overall and cues for anterior weight shift. Standing in parallel bars, instructed in HEP for LLE strengthening and ROM: hip flexion, hip abduction, and hip extension, x 20 each exercise with verbal cues for slow, controlled movement and keeping L knee in extension and verbal/visual/tactile cues for upright posture, forward gaze, and technique and seated rest breaks between exercises. Patient left sitting in wheelchair with all needs in reach.    Therapy Documentation Precautions:  Precautions Precautions: Fall Restrictions Weight Bearing Restrictions: Yes LLE Weight Bearing: Non weight bearing Pain: Pain Assessment Pain Assessment: 0-10 Pain Score: 5  Pain Type: Acute pain Pain  Location: Leg Pain Orientation: Left Pain Descriptors / Indicators: Aching Pain Onset: On-going Pain Intervention(s): Rest;Other (Comment) (premedicated)   See Function Navigator for Current Functional Status.   Therapy/Group: Individual Therapy  Laretta Alstrom 08/24/2015, 10:01 AM

## 2015-08-25 ENCOUNTER — Inpatient Hospital Stay (HOSPITAL_COMMUNITY): Payer: Medicaid Other | Admitting: Occupational Therapy

## 2015-08-25 ENCOUNTER — Inpatient Hospital Stay (HOSPITAL_COMMUNITY): Payer: Medicaid Other | Admitting: Physical Therapy

## 2015-08-25 LAB — CBC WITH DIFFERENTIAL/PLATELET
BASOS ABS: 0 10*3/uL (ref 0.0–0.1)
Basophils Relative: 0 %
EOS PCT: 4 %
Eosinophils Absolute: 0.4 10*3/uL (ref 0.0–0.7)
HEMATOCRIT: 27.2 % — AB (ref 39.0–52.0)
Hemoglobin: 8.4 g/dL — ABNORMAL LOW (ref 13.0–17.0)
LYMPHS PCT: 29 %
Lymphs Abs: 2.8 10*3/uL (ref 0.7–4.0)
MCH: 26.8 pg (ref 26.0–34.0)
MCHC: 30.9 g/dL (ref 30.0–36.0)
MCV: 86.9 fL (ref 78.0–100.0)
Monocytes Absolute: 0.6 10*3/uL (ref 0.1–1.0)
Monocytes Relative: 7 %
NEUTROS ABS: 5.7 10*3/uL (ref 1.7–7.7)
Neutrophils Relative %: 60 %
PLATELETS: 489 10*3/uL — AB (ref 150–400)
RBC: 3.13 MIL/uL — AB (ref 4.22–5.81)
RDW: 14.6 % (ref 11.5–15.5)
WBC: 9.6 10*3/uL (ref 4.0–10.5)

## 2015-08-25 LAB — BASIC METABOLIC PANEL
ANION GAP: 10 (ref 5–15)
BUN: 27 mg/dL — ABNORMAL HIGH (ref 6–20)
CO2: 25 mmol/L (ref 22–32)
Calcium: 9.5 mg/dL (ref 8.9–10.3)
Chloride: 94 mmol/L — ABNORMAL LOW (ref 101–111)
Creatinine, Ser: 1.15 mg/dL (ref 0.61–1.24)
GFR calc Af Amer: >60 mL/min (ref >60)
Glucose, Bld: 130 mg/dL — ABNORMAL HIGH (ref 65–99)
POTASSIUM: 5 mmol/L (ref 3.5–5.1)
SODIUM: 129 mmol/L — AB (ref 135–145)

## 2015-08-25 LAB — GLUCOSE, CAPILLARY
GLUCOSE-CAPILLARY: 98 mg/dL (ref 65–99)
Glucose-Capillary: 134 mg/dL — ABNORMAL HIGH (ref 65–99)
Glucose-Capillary: 178 mg/dL — ABNORMAL HIGH (ref 65–99)
Glucose-Capillary: 140 mg/dL — ABNORMAL HIGH (ref 65–99)

## 2015-08-25 MED ORDER — ENOXAPARIN SODIUM 40 MG/0.4ML ~~LOC~~ SOLN
40.0000 mg | SUBCUTANEOUS | Status: DC
  Administered 2015-08-25 – 2015-09-05 (×12): 40 mg via SUBCUTANEOUS
  Filled 2015-08-25 (×12): qty 0.4

## 2015-08-25 NOTE — Progress Notes (Signed)
ANTICOAGULATION CONSULT NOTE - Initial Consult  Pharmacy Consult for Lovenox Indication: VTE prophylaxis  No Known Allergies  Patient Measurements: Height: 6\' 1"  (185.4 cm) Weight: 180 lb 6.4 oz (81.829 kg) IBW/kg (Calculated) : 79.9  Vital Signs: Temp: 97.4 F (36.3 C) (07/14 0730) Temp Source: Oral (07/14 0730) BP: 112/66 mmHg (07/14 0818) Pulse Rate: 109 (07/14 0818)  Labs:  Recent Labs  08/25/15 0349  HGB 8.4*  HCT 27.2*  PLT 489*  CREATININE 1.15    Estimated Creatinine Clearance: 75.3 mL/min (by C-G formula based on Cr of 1.15).   Medical History: Past Medical History  Diagnosis Date  . Hypertension   . Hyperlipidemia   . Asthma   . PVD (peripheral vascular disease) ([place])   . Anemia   . Peripheral neuropathy ([place])   . Cellulitis and abscess of foot 07/2015  . Pneumonia X 1  . Type II diabetes mellitus ([place])   . Hepatitis C     states he was diagnosed in 2007 or 2007 while living in [place], [place]  . CVA (cerebral vascular accident) [facility]) 2015    denies residual on 08/15/2015    Assessment: 65yo male s/p BKA on 08/16/15, to start Lovenox for VTE px.  He had previously been receiving Heparin SQ.  Hg has been low but stable, and pltc are high.  No bleeding noted. CrCl > 51ml/min, no adjustment for renal fxn necessary.  Pharmacy will follow peripherally.  Goal of Therapy:  prevention of VTE Monitor platelets by anticoagulation protocol: Yes   Plan:  Lovenox 40mg  SQ q24 Watch CBC Watch for s/s of bleeding  Gracy Bruins, PharmD [facility]

## 2015-08-25 NOTE — Progress Notes (Signed)
Physical Therapy Session Note  Patient Details  Name: Cory Weber MRN: [id number] Date of Birth: [birth year]  Today's Date: 08/25/2015 PT Individual Time: [id number] PT Individual Time Calculation (min): 44 min   Short Term Goals: Week 1:  PT Short Term Goal 1 (Week 1): Patient will perform Squat pivot transfer with mod A.  PT Short Term Goal 2 (Week 1): Patient will ambulate with RW for 15 ft with mod A  PT Short Term Goal 3 (Week 1): Patient will perform WC mobillity for 150 ft  with supervision A PT Short Term Goal 4 (Week 1): Patient will initiate stair training.   Skilled Therapeutic Interventions/Progress Updates:    Patient received asleep in bed, but aroused easily and agreeable to PT. Patient performed Squat pivot transfer x2 with mod-min and mod cues for proper UE placement to increase clearance from WC .  PT instructed patient in [facility] mobility training for 166ftx 2 with supervision A and min cues for turns in tight areas. PT also instructed patient in [facility] obstacle course to weave through 8 cones x 3 forward and x 1 backward. Mod multimodal cues for improved turning technique to prevent hitting obstacles.   Following WC mobility training, patient reports feeling light headed. BP assessment 127/65. Sugar assessment by NT 95.   Patient returned to room and left sitting EOB with call bell and bed alarm on.   Therapy Documentation Precautions:  Precautions Precautions: Fall Restrictions Weight Bearing Restrictions: Yes LLE Weight Bearing: Non weight bearing General:   Vital Signs: Therapy Vitals Pulse Rate: 98 BP: 104/68 mmHg Pain:   0/10    See Function Navigator for Current Functional Status.   Therapy/Group: Individual Therapy  Lorie Phenix 08/25/2015, 6:16 PM

## 2015-08-25 NOTE — Progress Notes (Signed)
Physical Therapy Session Note  Patient Details  Name: Cory Weber MRN: [id number] Date of Birth: 05/26/1950  Today's Date: 08/25/2015 PT Group Time: 0830-1000 PT Group Time Calculation (min): 90 min  Short Term Goals: Week 1:  PT Short Term Goal 1 (Week 1): Patient will perform Squat pivot transfer with mod A.  PT Short Term Goal 2 (Week 1): Patient will ambulate with RW for 15 ft with mod A  PT Short Term Goal 3 (Week 1): Patient will perform WC mobillity for 150 ft  with supervision A PT Short Term Goal 4 (Week 1): Patient will initiate stair training.   Skilled Therapeutic Interventions/Progress Updates:      Therapy Documentation Precautions:  Precautions Precautions: Fall Restrictions Weight Bearing Restrictions: Yes LLE Weight Bearing: Non weight bearing  Patient participated in group treatment session   Patient denies any pain   Patient performed desensitization techniques to residual limb 5 minutes independently.  Patient educated and provided demonstration for figure 8 ACE wrapping min assist required for patient to complete. Performing two trials of residual limb wrapping. Discussion with patient to ensure proper hygiene of residual limb and to ensure sure hands are clear prior to performing any techniques.   UBE 12 minutes level 5  Patient propelled wheelchair 150 feet with close supervision verbal cues  Sit to and from stand transfer min assist. Patient reported feeling lightheadedness after first stand BP: assessed 86/65; HR 98. Patient provided rest break lightheadedness subsided.   Patient performed squat pivot transfer to and from wheelchair and mat mod assist.  Short sit to and from supine on mat close supervision.  Vitals re-assessed 115/73; HR 93.   Mat exercises performed bilaterally. : SLR 4x10 Hip abduction 4x10 Hip ext 4x10  BP reassessed prior to ambulation 100/66.  Sit toa nd from stand transfer min assist with RW  Patient ambulated  with RW 69fet hop to pattern min assist. Verbal cues for proper sequence and technique.   Patient returned to room at end of session with all needs met.   See Function Navigator for Current Functional Status.   Therapy/Group: Group Therapy  WRetta Diones7/14/2017, 1:32 PM

## 2015-08-25 NOTE — Progress Notes (Cosign Needed Addendum)
Occupational Therapy Session Note  Patient Details  Name: Cory Weber MRN: [id number] Date of Birth: [birth year]  Today's Date: 08/25/2015 OT Individual Time: 1000-1130  Treatment Time: 75 min   Short Term Goals: Week 1:  OT Short Term Goal 1 (Week 1): Pt will perform toilet transfer with mod A in order to decrease level of assist with functional transfers.  OT Short Term Goal 2 (Week 1): Pt will perform shower transfer with mod A in order to decrease level of assist with functional transfers. OT Short Term Goal 3 (Week 1): Pt will perform LB dressing with min A in order to increase I with self care.  OT Short Term Goal 4 (Week 1): Pt will perform toileting with min A in order to increase I with self care.   Skilled Therapeutic Interventions/Progress Updates:    Pt seen for skilled OT session focusing on self care. Pt sitting in w/c upon arrival and agreeable to tx session. Pt propelled w/c to shower and transferred to and from via squat pivot and grab bars needing mod A to squat from seated positon. OT demonstrated lateral leans and had pt perform lateral leans to doff pants and wash buttocks while in shower. Pt completed shower with supervision and dressed sitting on TTB needing VC to cross legs to donn socks and pants.  Pt completed grooming at sink while OT educated on continuum of care, energy conservation, and on problem solving community outings. OT educated pt on wraping ACE wrap and donning wrap on limb. Pt practiced wrapping residual limb and was able to remember figure eight technique requiring VC to cover limb completely. Pt propelled self to bed and recalled 2/3 steps to position w/c for squat pivot transfer. Pt transferred from w/c to bed with min A. OT educated pt on bridging in bed to slide up to simulate home environment. Therapist provided therapeutic listening throughout session and left pt supine in bed with all needs in reach.   Therapy Documentation Precautions:   Precautions Precautions: Fall Restrictions Weight Bearing Restrictions: Yes LLE Weight Bearing: Non weight bearing Pain: Pt denise pain   See Function Navigator for Current Functional Status.   Therapy/Group: Individual Therapy  Matilde Bash 08/25/2015, 11:38 AM

## 2015-08-25 NOTE — Progress Notes (Signed)
PHYSICAL MEDICINE & REHABILITATION     PROGRESS NOTE  Subjective/Complaints:  Pt sitting up at the edge of the bed this AM, eating breakfast.  He stAtes he is doing well in rehab.     ROS: Denies CP, SOB, N/V/D.  Objective: Vital Signs: Blood pressure 112/66, pulse 109, temperature 97.4 F (36.3 C), temperature source Oral, resp. rate 18, height 6\' 1"  (1.854 m), weight 81.829 kg (180 lb 6.4 oz), SpO2 99 %. No results found.  Recent Labs  08/22/15 0917 08/25/15 0349  WBC 11.0* 9.6  HGB 8.7* 8.4*  HCT 28.0* 27.2*  PLT 498* 489*    Recent Labs  08/25/15 0349  NA 129*  K 5.0  CL 94*  GLUCOSE 130*  BUN 27*  CREATININE 1.15  CALCIUM 9.5   CBG (last 3)   Recent Labs  08/24/15 1722 08/24/15 2056 08/25/15 0621  GLUCAP 90 144* 134*    Wt Readings from Last 3 Encounters:  08/21/15 81.829 kg (180 lb 6.4 oz)  08/15/15 90.3 kg (199 lb 1.2 oz)  07/23/15 90.266 kg (199 lb)    Physical Exam:  BP 112/66 mmHg  Pulse 109  Temp(Src) 97.4 F (36.3 C) (Oral)  Resp 18  Ht 6\' 1"  (1.854 m)  Wt 81.829 kg (180 lb 6.4 oz)  BMI 23.81 kg/m2  SpO2 99% Constitutional: He appears well-developed and well-nourished.  HENT: Normocephalic and atraumatic.  Cardiovascular: Normal rate and regular rhythm.  Respiratory: Effort normal and breath sounds normal. No respiratory distress.  GI: Soft. Bowel sounds are normal. He exhibits no distension.  Musculoskeletal: He exhibits edema and mild tenderness.  Neurological: He is alert and oriented.  Motor: B/l UE, RLE: 5/5 proximal to distal LLE: hip flexion 5/5  Skin: Skin is warm and dry.  Amputation site with mild serosanguinous drainage along lateral side (decreased) Psychiatric: He has a normal mood and affect. His behavior is normal.   Assessment/Plan: 1. Functional deficits secondary to to left BKA 08/16/2015 after recent left popliteal angioplasty which require 3+ hours per day of interdisciplinary therapy in a  comprehensive inpatient rehab setting. Physiatrist is providing close team supervision and 24 hour management of active medical problems listed below. Physiatrist and rehab team continue to assess barriers to discharge/monitor patient progress toward functional and medical goals.  Function:  Bathing Bathing position   Position: Sitting EOB  Bathing parts Body parts bathed by patient: Right arm, Left arm, Chest, Abdomen, Front perineal area, Buttocks, Right upper leg, Left upper leg, Right lower leg Body parts bathed by helper: Back  Bathing assist Assist Level: Touching or steadying assistance(Pt > 75%)      Upper Body Dressing/Undressing Upper body dressing   What is the patient wearing?: Pull over shirt/dress     Pull over shirt/dress - Perfomed by patient: Thread/unthread right sleeve, Thread/unthread left sleeve, Put head through opening, Pull shirt over trunk          Upper body assist Assist Level: Set up   Set up : To obtain clothing/put away  Lower Body Dressing/Undressing Lower body dressing   What is the patient wearing?: Underwear Underwear - Performed by patient: Thread/unthread right underwear leg, Thread/unthread left underwear leg, Pull underwear up/down                            Lower body assist Assist for lower body dressing: Set up   Set up : To obtain clothing/put away  Toileting Toileting Toileting activity did not occur: No continent bowel/bladder event Toileting steps completed by patient: Performs perineal hygiene Toileting steps completed by helper: Adjust clothing prior to toileting, Adjust clothing after toileting Toileting Assistive Devices: Grab bar or rail  Toileting assist Assist level: Two helpers   Transfers Chair/bed transfer   Chair/bed transfer method: Squat pivot Chair/bed transfer assist level: Touching or steadying assistance (Pt > 75%) Chair/bed transfer assistive device: Armrests     Locomotion Ambulation      Max distance: 20ft  Assist level: Moderate assist (Pt 50 - 74%)   Wheelchair   Type: Manual Max wheelchair distance: 150 Assist Level: Supervision or verbal cues  Cognition Comprehension Comprehension assist level: Follows basic conversation/direction with no assist  Expression Expression assist level: Expresses basic needs/ideas: With no assist  Social Interaction Social Interaction assist level: Interacts appropriately with others with medication or extra time (anti-anxiety, antidepressant).  Problem Solving Problem solving assist level: Solves basic problems with no assist  Memory Memory assist level: Recognizes or recalls 90% of the time/requires cueing < 10% of the time     Medical Problem List and Plan: 1. Decreased functional mobility secondary to left BKA 08/16/2015 after recent left popliteal angioplasty.  Cont CIR  Stump protector ordered 2. DVT Prophylaxis/Anticoagulation: Lovenox. Monitor for any bleeding episodes. 3. Pain Management: Neurontin 300 mg twice a day, Robaxin and oxycodone as needed 4. Acute blood loss anemia.   Hb 8.4 on 7/14 (stable)  Cont to monitor 5. Neuropsych: This patient is capable of making decisions on his own behalf. 6. Skin/Wound Care: Routine skin checks 7. Fluids/Electrolytes/Nutrition: Routine I&O's  8. Diabetes mellitus with peripheral neuropathy.Hemoglobin A1c 7.0   Lantus insulin 12 units daily  NovoLog 3 units 3 times a day, increased to 5U on 7/13.   Check blood sugars before meals and at bedtime and provide diabetic teaching  CBGs improved 9. Hypertension. Coreg 3.125 mg twice a day, Lisinopril 20 mg twice a day, HCTZ 12.5 mg twice a day. Monitor decreased mobility 10. Hyperlipidemia. Zocor 11. History CVA 20/15 without residual. Continue low-dose aspirin 12. BPH. Flomax 0.4 mg daily.   PVRs remain pending 13. History of tobacco/alcohol abuse. Monitor for any signs of withdrawal. Provide counseling 14. Chronic renal  insufficiency. Baseline creatinine 1.34.   Cr. 1.15 on 7/14 (improved)  Cont to monitor 15. Hyponetremia.   Na+ 129 on 7/14  Cont to monitor 16. PVD: Cont meds 17. Hepatitis C: Avoid hepatotoxic meds. 18. SIRS: Resolved  Currently Afebrile  Consulted ID.  Signed off.  Appreciate recs.  Abx d/ced on 7/13  Cont to monitor  LOS (Days) 4 A FACE TO FACE EVALUATION WAS PERFORMED  Ankit Lorie Phenix 08/25/2015 9:14 AM

## 2015-08-26 ENCOUNTER — Inpatient Hospital Stay (HOSPITAL_COMMUNITY): Payer: Medicaid Other | Admitting: *Deleted

## 2015-08-26 LAB — CBC WITH DIFFERENTIAL/PLATELET
BASOS ABS: 0 10*3/uL (ref 0.0–0.1)
BASOS PCT: 0 %
EOS ABS: 0.4 10*3/uL (ref 0.0–0.7)
EOS PCT: 5 %
HCT: 28.5 % — ABNORMAL LOW (ref 39.0–52.0)
Hemoglobin: 8.9 g/dL — ABNORMAL LOW (ref 13.0–17.0)
Lymphocytes Relative: 26 %
Lymphs Abs: 2.4 10*3/uL (ref 0.7–4.0)
MCH: 27.1 pg (ref 26.0–34.0)
MCHC: 31.2 g/dL (ref 30.0–36.0)
MCV: 86.6 fL (ref 78.0–100.0)
MONO ABS: 0.7 10*3/uL (ref 0.1–1.0)
MONOS PCT: 7 %
NEUTROS ABS: 5.7 10*3/uL (ref 1.7–7.7)
Neutrophils Relative %: 62 %
PLATELETS: 572 10*3/uL — AB (ref 150–400)
RBC: 3.29 MIL/uL — ABNORMAL LOW (ref 4.22–5.81)
RDW: 14.5 % (ref 11.5–15.5)
WBC: 9.1 10*3/uL (ref 4.0–10.5)

## 2015-08-26 LAB — BASIC METABOLIC PANEL
ANION GAP: 11 (ref 5–15)
BUN: 26 mg/dL — ABNORMAL HIGH (ref 6–20)
CALCIUM: 9.9 mg/dL (ref 8.9–10.3)
CO2: 26 mmol/L (ref 22–32)
CREATININE: 1.18 mg/dL (ref 0.61–1.24)
Chloride: 95 mmol/L — ABNORMAL LOW (ref 101–111)
GLUCOSE: 140 mg/dL — AB (ref 65–99)
Potassium: 4.4 mmol/L (ref 3.5–5.1)
Sodium: 132 mmol/L — ABNORMAL LOW (ref 135–145)

## 2015-08-26 LAB — GLUCOSE, CAPILLARY
GLUCOSE-CAPILLARY: 147 mg/dL — AB (ref 65–99)
GLUCOSE-CAPILLARY: 112 mg/dL — AB (ref 65–99)
GLUCOSE-CAPILLARY: 124 mg/dL — AB (ref 65–99)
Glucose-Capillary: 87 mg/dL (ref 65–99)

## 2015-08-26 NOTE — Progress Notes (Signed)
Patient ID: Cory Weber, male   DOB: 06/11/1950, 65 y.o.   MRN: [id number]   08/26/15.  Cory Weber PHYSICAL MEDICINE & REHABILITATION     PROGRESS NOTE  65 year old patient admitted for CIR with Functional deficits secondary to to left BKA 08/16/2015 after recent left popliteal angioplasty  Past Medical History  Diagnosis Date  . Hypertension   . Hyperlipidemia   . Asthma   . PVD (peripheral vascular disease) (Cory Weber)   . Anemia   . Peripheral neuropathy (Cory Weber)   . Cellulitis and abscess of foot 07/2015  . Pneumonia X 1  . Type II diabetes mellitus (Cory Weber)   . Hepatitis C     states he was diagnosed in 2007 or 2007 while living in [place], [place]  . CVA (cerebral vascular accident) (Cory Weber) 2015    denies residual on 08/15/2015     Subjective/Complaints:  Pt sitting up at the edge of the bed this AM, eating breakfast.  He states he is doing well in rehab.   He describes his postop pain as moderate  ROS: Denies CP, SOB, N/V/D.  Objective: Vital Signs: Blood pressure 170/138, pulse 88, temperature 98.5 F (36.9 C), temperature source Oral, resp. rate 18, height 6\' 1"  (1.854 m), weight 180 lb 6.4 oz (81.829 kg), SpO2 100 %. No results found.  Recent Labs  08/25/15 0349 08/26/15 0504  WBC 9.6 9.1  HGB 8.4* 8.9*  HCT 27.2* 28.5*  PLT 489* 572*    Recent Labs  08/25/15 0349 08/26/15 0504  NA 129* 132*  K 5.0 4.4  CL 94* 95*  GLUCOSE 130* 140*  BUN 27* 26*  CREATININE 1.15 1.18  CALCIUM 9.5 9.9   CBG (last 3)   Recent Labs  08/25/15 1650 08/25/15 2118 08/26/15 0651  GLUCAP 98 140* 147*    Wt Readings from Last 3 Encounters:  08/21/15 180 lb 6.4 oz (81.829 kg)  08/15/15 199 lb 1.2 oz (90.3 kg)  07/23/15 199 lb (90.266 kg)   Lab Results  Component Value Date   HGBA1C 7.0* 07/24/2015    CBG (last 3)   Recent Labs  08/25/15 1650 08/25/15 2118 08/26/15 0651  GLUCAP 98 140* 147*     BP Readings from Last 3 Encounters:  08/26/15 170/138  08/21/15  108/66  08/10/15 145/76   Physical Exam:  BP 170/138 mmHg  Pulse 88  Temp(Src) 98.5 F (36.9 C) (Oral)  Resp 18  Ht 6\' 1"  (1.854 m)  Wt 180 lb 6.4 oz (81.829 kg)  BMI 23.81 kg/m2  SpO2 100% Constitutional: He appears well-developed and well-nourished.  HENT: Normocephalic and atraumatic.  Cardiovascular: Normal rate and regular rhythm.  Respiratory: Effort normal and breath sounds normal. No respiratory distress.  GI: Soft. Bowel sounds are normal. He exhibits no distension.   Neurological: He is alert and oriented.  Motor: Normal strength  Skin: Skin is warm and dry.  Amputation site bandaged  Psychiatric: He has a normal mood and affect. His behavior is normal.     Medical Problem List and Plan: 1. Decreased functional mobility secondary to left BKA 08/16/2015 after recent left popliteal angioplasty.  Cont CIR  2. DVT Prophylaxis/Anticoagulation: Lovenox. Monitor for any bleeding episodes. 3. Pain Management: Neurontin 300 mg twice a day, Robaxin and oxycodone as needed 4. Acute blood loss anemia.   Hb 8.4 on 7/14 (stable)  Cont to monitor  5. Diabetes mellitus with peripheral neuropathy.Hemoglobin A1c 7.0   Lantus insulin 12 units daily  NovoLog 3 units  3 times a day, increased to 5U on 7/13.   Check blood sugars before meals and at bedtime and provide diabetic teaching  CBGs improved 6. Hypertension. Coreg 3.125 mg twice a day, Lisinopril 20 mg twice a day, HCTZ 12.5 mg twice a day. Monitor decreased mobility.  Blood pressure quite elevated earlier this a.m.  We'll continue to monitor    7. History CVA 20/15 without residual. Continue low-dose aspirin 8. BPH. Flomax 0.4 mg daily.   PVRs remain pending  9. Chronic renal insufficiency. Baseline creatinine 1.34.   Cr. 1.15 on 7/14 (improved)  Cont to monitor 10. Hyponetremia.   Na+ 129 on 7/14  Cont to monitor  11. Hepatitis C: Avoid hepatotoxic meds. 12. SIRS: Resolved   LOS (Days) 5 A FACE TO  FACE EVALUATION WAS PERFORMED  Cory Weber 08/26/2015 9:16 AM

## 2015-08-26 NOTE — Progress Notes (Signed)
Physical Therapy Session Note  Patient Details  Name: Cory Weber MRN: [id number] Date of Birth: 1950/12/11  Today's Date: 08/26/2015 PT Individual Time: 1015-1100 PT Individual Time Calculation (min): 45 min   Short Term Goals: Week 1:  PT Short Term Goal 1 (Week 1): Patient will perform Squat pivot transfer with mod A.  PT Short Term Goal 2 (Week 1): Patient will ambulate with RW for 15 ft with mod A  PT Short Term Goal 3 (Week 1): Patient will perform WC mobillity for 150 ft  with supervision A PT Short Term Goal 4 (Week 1): Patient will initiate stair training.  Week 2:     Skilled Therapeutic Interventions/Progress Updates:  Tx focused on functional mobility training, therex for strengthening and ROM, and gait in // bars. Pt up EOB, ready to go.  Lateral scoot transfer with Min A and safety cues  WC propulsion 2x150' with distant S. Pt able to manage WC parts with S cues.   Therapeutic exercise all 2x10 Seated LAQ and marching Standing in // bars:  Hip ABD  Hip Ext R heel raises Mini-squats   Sit<>stands in // bars with Min A  Gait in // bars x15' with Min A, demonstration, and cues throughout for technique and sequence. Pt performed better when bars lowered to achieve full elbow ext.   Pt left up in [facility] with all needs.   Therapy Documentation Precautions:  Precautions Precautions: Fall Restrictions Weight Bearing Restrictions: Yes LLE Weight Bearing: Non weight bearing General:   Vital Signs: Therapy Vitals Pulse Rate: 88 BP: (!) 170/138 mmHg Patient Position (if appropriate): Sitting Pain: "A little achy" modified tx prn   See Function Navigator for Current Functional Status.   Therapy/Group: Individual Therapy   Kennieth Rad, PT, DPT  08/26/2015, 10:40 AM

## 2015-08-27 ENCOUNTER — Inpatient Hospital Stay (HOSPITAL_COMMUNITY): Payer: Medicaid Other | Admitting: Occupational Therapy

## 2015-08-27 ENCOUNTER — Inpatient Hospital Stay (HOSPITAL_COMMUNITY): Payer: Medicaid Other | Admitting: Physical Therapy

## 2015-08-27 LAB — GLUCOSE, CAPILLARY
GLUCOSE-CAPILLARY: 175 mg/dL — AB (ref 65–99)
GLUCOSE-CAPILLARY: 116 mg/dL — AB (ref 65–99)
Glucose-Capillary: 145 mg/dL — ABNORMAL HIGH (ref 65–99)
Glucose-Capillary: 98 mg/dL (ref 65–99)

## 2015-08-27 NOTE — Progress Notes (Signed)
Patient ID: Cory Weber, male   DOB: 12/03/1950, 65 y.o.   MRN: [id number]   Patient ID: Cory Weber, male   DOB: September 24, 1950, 65 y.o.   MRN: [id number]   08/27/15.  [place] PHYSICAL MEDICINE & REHABILITATION     PROGRESS NOTE  65 year old patient admitted for CIR with Functional deficits secondary to to left BKA 08/16/2015 after recent left popliteal angioplasty  Past Medical History  Diagnosis Date  . Hypertension   . Hyperlipidemia   . Asthma   . PVD (peripheral vascular disease) ([place])   . Anemia   . Peripheral neuropathy ([place])   . Cellulitis and abscess of foot 07/2015  . Pneumonia X 1  . Type II diabetes mellitus ([place])   . Hepatitis C     states he was diagnosed in 2007 or 2007 while living in [place], [place]  . CVA (cerebral vascular accident) ([place]) 2015    denies residual on 08/15/2015     Subjective/Complaints:  Pt sitting up at the edge of the bed this AM, eating breakfast.  He states he is doing well in rehab.   He describes his postop pain as moderate.  He feels his pain is improving daily  ROS: Denies CP, SOB, N/V/D.  Objective: Vital Signs: Blood pressure 115/97, pulse 89, temperature 98.3 F (36.8 C), temperature source Oral, resp. rate 18, height 6\' 1"  (1.854 m), weight 180 lb 6.4 oz (81.829 kg), SpO2 100 %. No results found.  Recent Labs  08/25/15 0349 08/26/15 0504  WBC 9.6 9.1  HGB 8.4* 8.9*  HCT 27.2* 28.5*  PLT 489* 572*    Recent Labs  08/25/15 0349 08/26/15 0504  NA 129* 132*  K 5.0 4.4  CL 94* 95*  GLUCOSE 130* 140*  BUN 27* 26*  CREATININE 1.15 1.18  CALCIUM 9.5 9.9   CBG (last 3)   Recent Labs  08/26/15 1643 08/26/15 2051 08/27/15 0702  GLUCAP 124* 87 175*    Wt Readings from Last 3 Encounters:  08/21/15 180 lb 6.4 oz (81.829 kg)  08/15/15 199 lb 1.2 oz (90.3 kg)  07/23/15 199 lb (90.266 kg)   Lab Results  Component Value Date   HGBA1C 7.0* 07/24/2015    CBG (last 3)   Recent Labs  08/26/15 1643  08/26/15 2051 08/27/15 0702  GLUCAP 124* 87 175*     BP Readings from Last 3 Encounters:  08/27/15 115/97  08/21/15 108/66  08/10/15 145/76   Physical Exam:  BP 115/97 mmHg  Pulse 89  Temp(Src) 98.3 F (36.8 C) (Oral)  Resp 18  Ht 6\' 1"  (1.854 m)  Wt 180 lb 6.4 oz (81.829 kg)  BMI 23.81 kg/m2  SpO2 100% Constitutional: He appears well-developed and well-nourished.  HENT: Normocephalic and atraumatic.  Cardiovascular: Normal rate and regular rhythm.  Respiratory: Effort normal and breath sounds normal. No respiratory distress.  GI: Soft. Bowel sounds are normal. He exhibits no distension.   Neurological: He is alert and oriented.  Motor: Normal strength  Skin: Skin is warm and dry.  Amputation site bandaged  Psychiatric: He has a normal mood and affect. His behavior is normal.     Medical Problem List and Plan: 1. Decreased functional mobility secondary to left BKA 08/16/2015 after recent left popliteal angioplasty.  Cont CIR  2. DVT Prophylaxis/Anticoagulation: Lovenox. Monitor for any bleeding episodes. 3. Pain Management: Neurontin 300 mg twice a day, Robaxin and oxycodone as needed 4. Acute blood loss anemia.   Hb 8.4 on  7/14 (stable); 8.9 7/15.  Cont to monitor  5. Diabetes mellitus with peripheral neuropathy.Hemoglobin A1c 7.0   Lantus insulin 12 units daily  NovoLog 3 units 3 times a day, increased to 5U on 7/13.   Check blood sugars before meals and at bedtime and provide diabetic teaching  CBGs improved 6. Hypertension. Coreg 3.125 mg twice a day, Lisinopril 20 mg twice a day, HCTZ 12.5 mg twice a day. Monitor decreased mobility.  Blood pressure quite elevated earlier this a.m.  We'll continue to monitor    7. History CVA 20/15 without residual. Continue low-dose aspirin 8. BPH. Flomax 0.4 mg daily.   PVRs remain pending  9. Chronic renal insufficiency. Baseline creatinine 1.34.   Cr. 1.15 on 7/14 (improved)  Cont to monitor 10.  Hyponetremia.   Na+ 129 on 7/14; improved to 132 7/15  Cont to monitor  11. Hepatitis C: Avoid hepatotoxic meds. 12. SIRS: Resolved   LOS (Days) 6 A FACE TO FACE EVALUATION WAS PERFORMED  [name] 08/27/2015 8:43 AM

## 2015-08-27 NOTE — Progress Notes (Signed)
Patient becoming dizzy during therapy following BP med administration. BPs noted systolic low-mid 0000000. Irregular heartbeat noted. MD contacted. Orders given to hold Microzide and to get 12 lead EKG. EKG reported NSR with PACs and PVCs. Will report to oncoming RN.

## 2015-08-27 NOTE — Progress Notes (Signed)
Physical Therapy Session Note  Patient Details  Name: Cory Weber MRN: [id number] Date of Birth: 08/18/1950  Today's Date: 08/27/2015 PT Individual Time: 0815-0900 PT Individual Time Calculation (min): 45 min   Short Term Goals: Week 1:  PT Short Term Goal 1 (Week 1): Patient will perform Squat pivot transfer with mod A.  PT Short Term Goal 2 (Week 1): Patient will ambulate with RW for 15 ft with mod A  PT Short Term Goal 3 (Week 1): Patient will perform WC mobillity for 150 ft  with supervision A PT Short Term Goal 4 (Week 1): Patient will initiate stair training.   Skilled Therapeutic Interventions/Progress Updates:  Pt was seen sitting up in w/c in the am following breakfast. Pt propelled w/c about 150 feet with B UEs and S with increased time. In gym treatment focused on L LE rom and strengthening. Pt performed knee flex/ext and quad sets, 3 sets x 10 reps each. Pt c/o dizziness sitting up in w/c, BP 98/65, HR 80s (manually) , and O2 sat 100%. Pt allowed to rest. Pt wanted to attempted stairs. Pt transferred sit to stand at stairs with B rails and mod A with verbal cues. Pt c/o dizziness again, returned to sitting. BP 91/60, HR 80s (manually), O2 sat 100%. Pt returned to room. Pt transferred w/c to edge of bed squat pivot with min guard and verbal cues. Pt transferred edge of bed to supine with S and side rail. Pt left sitting up in bed with call bell within reach, notified pt's nurse of complaints and vitals.    Therapy Documentation Precautions:  Precautions Precautions: Fall Restrictions Weight Bearing Restrictions: Yes LLE Weight Bearing: Non weight bearing General: PT Amount of Missed Time (min): 15 Minutes PT Missed Treatment Reason: Other (Comment) (pt just received breakfast tray) Pain: Pt c/o 7/10 pain L BKA.     See Function Navigator for Current Functional Status.   Therapy/Group: Individual Therapy  Dub Amis 08/27/2015, 10:31 AM

## 2015-08-27 NOTE — Progress Notes (Signed)
Physical Therapy Session Note  Patient Details  Name: Cory Weber MRN: [id number] Date of Birth: 1950-11-22  Today's Date: 08/27/2015 PT Individual Time: 1045-1200 PT Individual Time Calculation (min): 75 min   Short Term Goals: Week 1:  PT Short Term Goal 1 (Week 1): Patient will perform Squat pivot transfer with mod A.  PT Short Term Goal 2 (Week 1): Patient will ambulate with RW for 15 ft with mod A  PT Short Term Goal 3 (Week 1): Patient will perform WC mobillity for 150 ft  with supervision A PT Short Term Goal 4 (Week 1): Patient will initiate stair training.   Skilled Therapeutic Interventions/Progress Updates:    Pt received seated on EOB; c/o pain as below and agreeable to treatment. Transfer bed<>w/c with min guard and cues for increasing hip clearance over wheel to maintain skin integrity. W/c propulsion x175' and x125' with BUE and S; occasional short rest breaks due to UE fatigue and pt hyperverbal often distracting himself with conversation and requiring cues for attention to task. Transfer w/c <>nustep x2 with close S. Nustep level 7 with BUE, RLE for strengthening and aerobic endurance x10 min. Partial sit <>stand 2x5 reps with BUE on chair in front of pt to facilitate anterior weight shift and trunk flexion for improved hip clearance during transfers; pt has difficulty clearing hips from table even with table elevated due to RLE strength/coordination deficits. Transfers x4 mat table <>chair with mod cues for sequencing, hand placement and anterior weight shift. Returned to room with w/c propulsion S. Seated BP assessed 110/60 with manual cuff. Pt returned to bed squat pivot with S. Remained supine in bed at completion of session, all needs in reach.   Therapy Documentation Precautions:  Precautions Precautions: Fall Restrictions Weight Bearing Restrictions: Yes LLE Weight Bearing: Non weight bearing Pain: Pain Assessment Pain Assessment: 0-10 Pain Score: 7  Pain  Type: Acute pain Pain Location: Leg Pain Orientation: Left Pain Descriptors / Indicators: Aching Pain Frequency: Constant Pain Onset: On-going Pain Intervention(s): Medication (See eMAR)   See Function Navigator for Current Functional Status.   Therapy/Group: Individual Therapy  Luberta Mutter 08/27/2015, 12:51 PM

## 2015-08-28 ENCOUNTER — Inpatient Hospital Stay (HOSPITAL_COMMUNITY): Payer: Medicaid Other | Admitting: Occupational Therapy

## 2015-08-28 ENCOUNTER — Inpatient Hospital Stay (HOSPITAL_COMMUNITY): Payer: Medicaid Other | Admitting: Physical Therapy

## 2015-08-28 ENCOUNTER — Inpatient Hospital Stay (HOSPITAL_COMMUNITY): Payer: Medicaid Other

## 2015-08-28 ENCOUNTER — Ambulatory Visit: Payer: Medicaid Other | Admitting: Surgery

## 2015-08-28 DIAGNOSIS — N189 Chronic kidney disease, unspecified: Secondary | ICD-10-CM

## 2015-08-28 DIAGNOSIS — B182 Chronic viral hepatitis C: Secondary | ICD-10-CM

## 2015-08-28 DIAGNOSIS — E785 Hyperlipidemia, unspecified: Secondary | ICD-10-CM

## 2015-08-28 LAB — GLUCOSE, CAPILLARY
GLUCOSE-CAPILLARY: 121 mg/dL — AB (ref 65–99)
Glucose-Capillary: 118 mg/dL — ABNORMAL HIGH (ref 65–99)
Glucose-Capillary: 97 mg/dL (ref 65–99)
Glucose-Capillary: 116 mg/dL — ABNORMAL HIGH (ref 65–99)

## 2015-08-28 NOTE — Plan of Care (Signed)
Problem: RH BOWEL ELIMINATION Goal: RH STG MANAGE BOWEL W/MEDICATION W/ASSISTANCE STG Manage Bowel with Medication with min Assistance.  Outcome: Not Progressing No BM since 08/24/15. Sorbitol PRN given by day shift but still no BM

## 2015-08-28 NOTE — Progress Notes (Signed)
Occupational Therapy Session Note  Patient Details  Name: Cory Weber MRN: [id number] Date of Birth: [birth year]  Today's Date: 08/28/2015 OT Individual Time: 1045-1200 OT Individual Time Calculation (min): 75 min    Short Term Goals: Week 1:  OT Short Term Goal 1 (Week 1): Pt will perform toilet transfer with mod A in order to decrease level of assist with functional transfers.  OT Short Term Goal 2 (Week 1): Pt will perform shower transfer with mod A in order to decrease level of assist with functional transfers. OT Short Term Goal 3 (Week 1): Pt will perform LB dressing with min A in order to increase I with self care.  OT Short Term Goal 4 (Week 1): Pt will perform toileting with min A in order to increase I with self care.   Skilled Therapeutic Interventions/Progress Updates:    Pt seen for skilled OT session focusing on self care or residual limb and functional transfers. Pt sitting in w/c and agreeable to tx session upon arrival. Pt refused showers stating nursing help him "wash up." Pt complained of dizziness BP taken at 100/79. OT educated pt on donning/caring for shrinker. Pt returned demonstration donning shrinker twice requiring min VC. Pt transferred to w/c via squat pivot and mod A. Pt propelled w/c to sink and washed extra shrinker in standing using sink for support. Pt needed two rest breaks due to [place]. Pt self propelled w/c to and from therapy gym to get shorter sliding board. Pt practiced sliding board transfers in preparation for going home alone. After demonstration from OT and several attempts pt able to place board and slide across board with supervision.   Pt then practiced stand pivot with RW and min A to w/c in simulation of standard BSC. However, due to weakness/fatigue pt unable to stand to initiate stand pivot back to bed. Pt became emotional and sliding board was used to get pt back to bed. Due to increased assist OT feels a bariatric drop arm commode will make  pt most independent, but insurance may not pay due to getting a BSC several weeks ago. CSW was contacted. Pt left sitting EOB with all needs met. Throughout session OT provided therapeutic listening and educated on energy conservation.   Therapy Documentation Precautions:  Precautions Precautions: Fall Restrictions Weight Bearing Restrictions: Yes LLE Weight Bearing: Non weight bearing Pain:  Denies pain  See Function Navigator for Current Functional Status.   Therapy/Group: Individual Therapy  Matilde Bash 08/28/2015, 12:14 PM

## 2015-08-28 NOTE — Progress Notes (Signed)
[place] PHYSICAL MEDICINE & REHABILITATION     PROGRESS NOTE  Subjective/Complaints:  Left leg a little store. Mild drainage. Notes some phantom pain. Has occasionally bumped stump..     ROS: Denies CP, SOB, N/V/D.  Objective: Vital Signs: Blood pressure 98/69, pulse 116, temperature 97.8 F (36.6 C), temperature source Oral, resp. rate 18, height 6\' 1"  (1.854 m), weight 81.693 kg (180 lb 1.6 oz), SpO2 100 %. No results found.  Recent Labs  08/26/15 0504  WBC 9.1  HGB 8.9*  HCT 28.5*  PLT 572*    Recent Labs  08/26/15 0504  NA 132*  K 4.4  CL 95*  GLUCOSE 140*  BUN 26*  CREATININE 1.18  CALCIUM 9.9   CBG (last 3)   Recent Labs  08/27/15 1651 08/27/15 2145 08/28/15 0656  GLUCAP 145* 98 118*    Wt Readings from Last 3 Encounters:  08/28/15 81.693 kg (180 lb 1.6 oz)  08/15/15 90.3 kg (199 lb 1.2 oz)  07/23/15 90.266 kg (199 lb)    Physical Exam:  BP 98/69 mmHg  Pulse 116  Temp(Src) 97.8 F (36.6 C) (Oral)  Resp 18  Ht 6\' 1"  (1.854 m)  Wt 81.693 kg (180 lb 1.6 oz)  BMI 23.77 kg/m2  SpO2 100% Constitutional: He appears well-developed and well-nourished.  HENT: Normocephalic and atraumatic.  Cardiovascular: Normal rate and regular rhythm.  Respiratory: Effort normal and breath sounds normal. No respiratory distress.  GI: Soft. Bowel sounds are normal. He exhibits no distension.  Musculoskeletal: He exhibits edema and mild tenderness. Stump well-shaped Neurological: He is alert and oriented.  Motor: B/l UE, RLE: 5/5 proximal to distal LLE: hip flexion 5/5  Skin: Skin is warm and dry.  Amputation site with minimal serosanguinous drainage along lateral and medial sides Psychiatric: He has a normal mood and affect. His behavior is normal.   Assessment/Plan: 1. Functional deficits secondary to to left BKA 08/16/2015 after recent left popliteal angioplasty which require 3+ hours per day of interdisciplinary therapy in a comprehensive inpatient  rehab setting. Physiatrist is providing close team supervision and 24 hour management of active medical problems listed below. Physiatrist and rehab team continue to assess barriers to discharge/monitor patient progress toward functional and medical goals.  Function:  Bathing Bathing position   Position: Shower  Bathing parts Body parts bathed by patient: Right arm, Left arm, Abdomen, Chest, Front perineal area, Buttocks, Right upper leg, Right lower leg, Left upper leg, Back Body parts bathed by helper: Back  Bathing assist Assist Level: Supervision or verbal cues      Upper Body Dressing/Undressing Upper body dressing   What is the patient wearing?: Pull over shirt/dress     Pull over shirt/dress - Perfomed by patient: Thread/unthread right sleeve, Thread/unthread left sleeve, Put head through opening, Pull shirt over trunk          Upper body assist Assist Level: Set up   Set up : To obtain clothing/put away  Lower Body Dressing/Undressing Lower body dressing   What is the patient wearing?: Pants, Non-skid slipper socks, Underwear Underwear - Performed by patient: Thread/unthread right underwear leg, Thread/unthread left underwear leg, Pull underwear up/down   Pants- Performed by patient: Thread/unthread right pants leg, Thread/unthread left pants leg, Pull pants up/down Pants- Performed by helper: Pull pants up/down Non-skid slipper socks- Performed by patient: Don/doff right sock                    Lower body assist Assist  for lower body dressing: Set up   Set up : To obtain clothing/put away  Toileting Toileting Toileting activity did not occur: Refused Toileting steps completed by patient: Adjust clothing prior to toileting, Performs perineal hygiene, Adjust clothing after toileting Toileting steps completed by helper: Adjust clothing prior to toileting, Adjust clothing after toileting, Performs perineal hygiene Toileting Assistive Devices: Grab bar or rail   Toileting assist Assist level: Touching or steadying assistance (Pt.75%)   Transfers Chair/bed transfer   Chair/bed transfer method: Squat pivot Chair/bed transfer assist level: Touching or steadying assistance (Pt > 75%) Chair/bed transfer assistive device: Armrests     Locomotion Ambulation     Max distance: 50ft  Assist level: Moderate assist (Pt 50 - 74%)   Wheelchair   Type: Manual Max wheelchair distance: 150' Assist Level: Supervision or verbal cues  Cognition Comprehension Comprehension assist level: Follows complex conversation/direction with no assist  Expression Expression assist level: Expresses complex ideas: With no assist  Social Interaction Social Interaction assist level: Interacts appropriately with others - No medications needed.  Problem Solving Problem solving assist level: Solves complex problems: Recognizes & self-corrects  Memory Memory assist level: Complete Independence: No helper     Medical Problem List and Plan: 1. Decreased functional mobility secondary to left BKA 08/16/2015 after recent left popliteal angioplasty.  Cont CIR  Stump protector bein used 2. DVT Prophylaxis/Anticoagulation: Lovenox. Monitor for any bleeding episodes. 3. Pain Management: Neurontin 300 mg twice a day, Robaxin and oxycodone as needed 4. Acute blood loss anemia.   Hb 8.4 on 7/14 (stable)  Cont to monitor 5. Neuropsych: This patient is capable of making decisions on his own behalf. 6. Skin/Wound Care: Routine skin checks---dry dressing to stump  -change to shrinker later this week 7. Fluids/Electrolytes/Nutrition: Routine I&O's  8. Diabetes mellitus with peripheral neuropathy.Hemoglobin A1c 7.0   Lantus insulin 12 units daily  NovoLog 3 units 3 times a day, increased to 5U on 7/13.   CBGs  Much improved 9. Hypertension. Coreg 3.125 mg twice a day, Lisinopril 20 mg twice a day, HCTZ 12.5 mg twice a day. Monitor decreased mobility 10. Hyperlipidemia. Zocor 11.  History CVA 20/15 without residual. Continue low-dose aspirin 12. BPH. Flomax 0.4 mg daily.   PVRs remain pending 13. History of tobacco/alcohol abuse. Monitor for any signs of withdrawal. Provide counseling 14. Chronic renal insufficiency. Baseline creatinine 1.34.   Cr. 1.18 on 7/15   Cont to monitor 15. Hyponetremia.   Na+ 132 on 7/15  Cont to monitor 16. PVD: Cont meds 17. Hepatitis C: Avoid hepatotoxic meds. 18. SIRS: Resolved  Currently Afebrile  Consulted ID.  Signed off.  Appreciate recs.  Abx d/ced on 7/13  Cont to monitor  LOS (Days) 7 A FACE TO FACE EVALUATION WAS PERFORMED  Poppi Scantling T 08/28/2015 9:36 AM

## 2015-08-28 NOTE — Discharge Instructions (Signed)
Inpatient Rehab Discharge Instructions  CALLETANO KURZWEIL Discharge date and time: No discharge date for patient encounter.   Activities/Precautions/ Functional Status: Activity: activity as tolerated Diet: diabetic diet Wound Care: keep wound clean and dry Functional status:  ___ No restrictions     ___ Walk up steps independently ___ 24/7 supervision/assistance   ___ Walk up steps with assistance ___ Intermittent supervision/assistance  ___ Bathe/dress independently ___ Walk with walker     _x__ Bathe/dress with assistance ___ Walk Independently    ___ Shower independently ___ Walk with assistance    ___ Shower with assistance ___ No alcohol     ___ Return to work/school ________  Special Instructions:    My questions have been answered and I understand these instructions. I will adhere to these goals and the provided educational materials after my discharge from the hospital.  Patient/Caregiver Signature _______________________________ Date __________  Clinician Signature _______________________________________ Date __________  Please bring this form and your medication list with you to all your follow-up doctor's appointments.

## 2015-08-28 NOTE — Progress Notes (Signed)
Physical Therapy Session Note  Patient Details  Name: Cory Weber MRN: [id number] Date of Birth: 1950-11-02  Today's Date: 08/28/2015 PT Individual Time: 0800-0900 PT Individual Time Calculation (min): 60 min   Short Term Goals: Week 1:  PT Short Term Goal 1 (Week 1): Patient will perform Squat pivot transfer with mod A.  PT Short Term Goal 2 (Week 1): Patient will ambulate with RW for 15 ft with mod A  PT Short Term Goal 3 (Week 1): Patient will perform WC mobillity for 150 ft  with supervision A PT Short Term Goal 4 (Week 1): Patient will initiate stair training.   Skilled Therapeutic Interventions/Progress Updates:   D/c planning discussed especially in regards to home access with plan for ramp - pt calling landlord to leave him another message but unable to get through. Also spoke with his daughter who is coming in from [place] today and planning to meet with CSW. Pt reports his hired assistance needs to be re-evaluated as well. Discussed home set-up and getting measurements. Handout given for him to have daughter and nephew fill out and return.  Focused on functional transfers (squat pivot) with focus on clearing bottom during transfers with verbal cues for technique and up to min assist, w/c mobility for general strengthening and endurance and functional mobility, and seated therex for functional strengthening of LE and core strengthening exercises to aid with overall mobility. Performed in seated position due to lightheadedness with BP low (92/65 mmHg) and tested again (see DocFlowsheets). Instructed in seated SAQ/LAQ with 3" ankle weight on R, hip flexion with 3# ankle weights BLE x 10 reps each. PNF diagonal with red weighted medicine ball x 10 reps each direction. End of session RN notified of low BP and pt reports of lightheadedness.   Therapy Documentation Precautions:  Precautions Precautions: Fall Restrictions Weight Bearing Restrictions: Yes LLE Weight Bearing: Non weight  bearing  Pain: No complaints.       See Function Navigator for Current Functional Status.   Therapy/Group: Individual Therapy  [name], PT, DPT  08/28/2015, 9:05 AM

## 2015-08-28 NOTE — Progress Notes (Signed)
Occupational Therapy Session Note  Patient Details  Name: Cory Weber MRN: [id number] Date of Birth: December 05, 1950  Today's Date: 08/28/2015 OT Individual Time: 0900-1000 OT Individual Time Calculation (min): 60 min   Short Term Goals: Week 1:  OT Short Term Goal 1 (Week 1): Pt will perform toilet transfer with mod A in order to decrease level of assist with functional transfers.  OT Short Term Goal 2 (Week 1): Pt will perform shower transfer with mod A in order to decrease level of assist with functional transfers. OT Short Term Goal 3 (Week 1): Pt will perform LB dressing with min A in order to increase I with self care.  OT Short Term Goal 4 (Week 1): Pt will perform toileting with min A in order to increase I with self care.   Skilled Therapeutic Interventions/Progress Updates:  Patient found seated in w/c. Pt with complaints of feeling "woozy" and 7/10 complaints of pain in right residual limb, monitored all this during session and RN present for medication management towards end of session. Pt with request to use BR before therapist able to check BP. Pt transferred w/c to [facility] seated over toilet seat in BR with min assist. Pt doffed old pants and donned clean ones with min assist for pulling them up to waist. Pt transferred back to w/c with min assist and BP checked =110/63. Pt continued to have complaints of being "woozy", but willing to work with therapist. Pt propelled self to sink side and performed grooming tasks (washing face, brushing teeth, washing hands), and UB ADL tasks in seated position. Therapist assisted pt with donning of prosthetic shrinker and limb guard. Pt then donned bilateral w/c gloves and worked on w/c propulsion to work on increasing independence with w/c mobility and overall strength & endurance. Pt transferred back to bed per his request and therapist left pt supine in bed with all needs within reach and bed alarm set.   Therapy Documentation Precautions:   Precautions Precautions: Fall Restrictions Weight Bearing Restrictions: Yes LLE Weight Bearing: Non weight bearing  Vital Signs: Therapy Vitals Temp: 97.8 F (36.6 C) Temp Source: Oral Pulse Rate: 92 Resp: 18 BP: 128/74 mmHg Patient Position (if appropriate): Lying Oxygen Therapy SpO2: 100 % O2 Device: Not Delivered  See Function Navigator for Current Functional Status.  Therapy/Group: Individual Therapy  Chrys Racer , MS, OTR/L, CLT  08/28/2015, 10:04 AM

## 2015-08-29 ENCOUNTER — Inpatient Hospital Stay (HOSPITAL_COMMUNITY): Payer: Medicaid Other

## 2015-08-29 ENCOUNTER — Inpatient Hospital Stay (HOSPITAL_COMMUNITY): Payer: Medicaid Other | Admitting: Physical Therapy

## 2015-08-29 ENCOUNTER — Inpatient Hospital Stay (HOSPITAL_COMMUNITY): Payer: Medicaid Other | Admitting: Occupational Therapy

## 2015-08-29 LAB — GLUCOSE, CAPILLARY
GLUCOSE-CAPILLARY: 114 mg/dL — AB (ref 65–99)
GLUCOSE-CAPILLARY: 113 mg/dL — AB (ref 65–99)
GLUCOSE-CAPILLARY: 168 mg/dL — AB (ref 65–99)
Glucose-Capillary: 104 mg/dL — ABNORMAL HIGH (ref 65–99)

## 2015-08-29 NOTE — Progress Notes (Signed)
Occupational Therapy Session Note  Patient Details  Name: Cory Weber MRN: [id number] Date of Birth: 09-27-1950  Today's Date: 08/29/2015 OT Individual Time: 0900-1000 OT Individual Time Calculation (min): 60 min    Short Term Goals: Week 1:  OT Short Term Goal 1 (Week 1): Pt will perform toilet transfer with mod A in order to decrease level of assist with functional transfers.  OT Short Term Goal 2 (Week 1): Pt will perform shower transfer with mod A in order to decrease level of assist with functional transfers. OT Short Term Goal 3 (Week 1): Pt will perform LB dressing with min A in order to increase I with self care.  OT Short Term Goal 4 (Week 1): Pt will perform toileting with min A in order to increase I with self care.   Skilled Therapeutic Interventions/Progress Updates:    Pt seen for skilled OT session focusing on self care and functional mobility. Pt sitting in w/c and agreeable to tx session upon arrival (daughter and sister came in during start of session). Pt denied taking shower reporting washing up and changing clothes on his own this morning. OT educated pt on waiting until staff is present to assist him and not to stand alone. Pt reported understanding and asked to donn shrinker needing 1 VC for placement. Pt self propelled w/c to therapy gym and transferred to mat via squat pivot with min A. Pt practiced donning simulated theraband pants performing lateral leans in preparation for LB dressing at home. Pt able to donn pants with min VC for technique.  Pt stood from mat to RW with min-mod A throughout session to put pipes together requiring frequent rest breaks due to fatigue (pt is not able to iniate rest breaks). Pt transferred to w/c via stand pivot with RW needing mod A due to decreased coordination/strength. Pt self propelled w/c to room to practice [facility] transfers. Pt transferred from w/c to [facility] with RW initially needing min A however needed mod A once fatigued. Continue  to practice due to pt being alone most of the time and only having standard BSC (not drop arm) from prior hospitalization 2 weeks ago. Pt left in w/c with all needs met with sister and daughter present. Throughout session OT educated family on pt's current needs and needed assist upon d/c.   Therapy Documentation Precautions:  Precautions Precautions: Fall Restrictions Weight Bearing Restrictions: Yes LLE Weight Bearing: Non weight bearing Pain: Denies pain  See Function Navigator for Current Functional Status.   Therapy/Group: Individual Therapy  Matilde Bash 08/29/2015, 1:30 PM

## 2015-08-29 NOTE — Progress Notes (Signed)
Physical Therapy Weekly Progress Note  Patient Details  Name: Cory Weber MRN: [id number] Date of Birth: 1950-10-15  Beginning of progress report period: August 22, 2015 End of progress report period: August 29, 2015  Individual Treatment: 1330-1400 (30 min) Session focused on functional transfers (squat pivot transfers), sit <> stands, and short distance gait in parallel bars. Pt able to perform more efficient transfers this PM with steady assist to close supervision but still requires extra time and cues for w/c set-up and w/c parts management. Pt able to perform sit <> stands in parallel bars with min assist with cues for placement and progressed to trial x 3 with RW and with min assist for balance. Pt able to gait in parallel bars with min assist x 10 reps and cues for efficient use of UE's. End of session transferred back to bed.     Patient has met 2 of 4 short term goals. Pt making functional progress but continues to be inconsistent with levels of assist for transfers. Barrier continues to be discharge plan as pt lives alone and at this time does not have plan for ramp access. Working with family and team to determine safe home access and daughter to get measurements for w/c accessibility.   Patient continues to demonstrate the following deficits: decreased strength, decreased balance, decreased endurance, decreased functional mobility and therefore will continue to benefit from skilled PT intervention to enhance overall performance with activity tolerance, balance, ability to compensate for deficits, awareness and knowledge of precautions.  Patient progressing toward long term goals..  Continue plan of care.  PT Short Term Goals Week 1:  PT Short Term Goal 1 (Week 1): Patient will perform Squat pivot transfer with mod A.  PT Short Term Goal 1 - Progress (Week 1): Met PT Short Term Goal 2 (Week 1): Patient will ambulate with RW for 15 ft with mod A  PT Short Term Goal 2 - Progress  (Week 1): Not met PT Short Term Goal 3 (Week 1): Patient will perform WC mobillity for 150 ft  with supervision A PT Short Term Goal 3 - Progress (Week 1): Met PT Short Term Goal 4 (Week 1): Patient will initiate stair training.  PT Short Term Goal 4 - Progress (Week 1): Not met Week 2:  PT Short Term Goal 1 (Week 2): Pt will perform basic transfers with supervision PT Short Term Goal 2 (Week 2): Pt will progress sit <> stands with min assist with RW PT Short Term Goal 3 (Week 2): Plan will be in place for ramp to be built for home access  Skilled Therapeutic Interventions/Progress Updates:  Ambulation/gait training;Balance/vestibular training;Cognitive remediation/compensation;Community reintegration;Discharge planning;Disease management/prevention;DME/adaptive equipment instruction;Functional mobility training;Neuromuscular re-education;Pain management;Patient/family education;Psychosocial support;Skin care/wound management;Splinting/orthotics;Stair training;Therapeutic Activities;Therapeutic Exercise;UE/LE Strength taining/ROM;UE/LE Coordination activities;Visual/perceptual remediation/compensation;Wheelchair propulsion/positioning   Therapy Documentation Precautions:  Precautions Precautions: Fall Restrictions Weight Bearing Restrictions: Yes LLE Weight Bearing: Non weight bearing  Pain: Denies pain.  See Function Navigator for Current Functional Status.  Therapy/Group: Individual Therapy  [name], PT, DPT  08/29/2015, 4:29 PM

## 2015-08-29 NOTE — Progress Notes (Signed)
Physical Therapy Session Note  Patient Details  Name: Cory Weber MRN: [id number] Date of Birth: 1950/08/11  Today's Date: 08/29/2015 PT Individual Time: 1420-1450 PT Individual Time Calculation (min): 30 min   Short Term Goals: Week 1:  PT Short Term Goal 1 (Week 1): Patient will perform Squat pivot transfer with mod A.  PT Short Term Goal 1 - Progress (Week 1): Met PT Short Term Goal 2 (Week 1): Patient will ambulate with RW for 15 ft with mod A  PT Short Term Goal 2 - Progress (Week 1): Not met PT Short Term Goal 3 (Week 1): Patient will perform WC mobillity for 150 ft  with supervision A PT Short Term Goal 3 - Progress (Week 1): Met PT Short Term Goal 4 (Week 1): Patient will initiate stair training.  PT Short Term Goal 4 - Progress (Week 1): Not met Week 2:  PT Short Term Goal 1 (Week 2): Pt will perform basic transfers with supervision PT Short Term Goal 2 (Week 2): Pt will progress sit <> stands with min assist with RW PT Short Term Goal 3 (Week 2): Plan will be in place for ramp to be built for home access   Therapy Documentation Precautions:  Precautions Precautions: Fall Restrictions Weight Bearing Restrictions: Yes LLE Weight Bearing: Non weight bearing General:   Vital Signs: Therapy Vitals Temp: 98.2 F (36.8 C) Temp Source: Oral Pulse Rate: 90 Resp: 18 BP: 102/71 mmHg Patient Position (if appropriate): Lying Oxygen Therapy SpO2: 95 % O2 Device: Not Delivered Pain: Pain Assessment Faces Pain Scale: Hurts a little bit  Patient performed performed supine to short sit bed mobility independently  Patient propelled wheelchair to and from gym and room 150 feet close supervision   Squat pivot transfer from edge of bed to wheelchair close supervision. Verbal cues for proper technique and positioning. Patient required set-up assistance and positioning with wheelchair patient required min verbal cues for proper placement of wheelchair.   Sit to and from  stand mod assist with RW mod assist with right hand on RW and left hand on [name]  Patient stood for 30 seconds with close supervision with RW. Verbal cues for upright posture and position.   Sit to and from stand min assist with RW with left hand on RW and right hand on [name]  Patient stood for 45 seconds with close supervision with RW. Verbal cues for upright posture and position.   Therapist reapplied Ace wrap figure 8 patten to residual limb.   Patient returned to room at end of session with all needs met.      See Function Navigator for Current Functional Status.   Therapy/Group: Individual Therapy  Retta Diones 08/29/2015, 3:18 PM

## 2015-08-29 NOTE — Progress Notes (Signed)
Physical Therapy Session Note  Patient Details  Name: Cory Weber MRN: [id number] Date of Birth: Feb 08, 1951  Today's Date: 08/29/2015 PT Individual Time: 1000-1100 PT Individual Time Calculation (min): 60 min   Short Term Goals: Week 1:  PT Short Term Goal 1 (Week 1): Patient will perform Squat pivot transfer with mod A.  PT Short Term Goal 1 - Progress (Week 1): Met PT Short Term Goal 2 (Week 1): Patient will ambulate with RW for 15 ft with mod A  PT Short Term Goal 2 - Progress (Week 1): Not met PT Short Term Goal 3 (Week 1): Patient will perform WC mobillity for 150 ft  with supervision A PT Short Term Goal 3 - Progress (Week 1): Met PT Short Term Goal 4 (Week 1): Patient will initiate stair training.  PT Short Term Goal 4 - Progress (Week 1): Not met  Skilled Therapeutic Interventions/Progress Updates:   Pt's daughter and sister present and education on PT's concerns about home access and concerns in regards to safety with mobility and transfers without support available. Daughter in agreement and reports she already has plans to discuss with CSW and educated that we have team conference. At this point, no plan for ramp has been initiated and emphasized that this is essential prior to d/c for safe home access. Discussed and educated on various transfer techniques and at this time, safest transfers occur with use of slideboard due to pt being inconsistent with ability to perform squat pivots with enough clearance and sit <> stands. Practiced car transfer with slideboard (close supervision and steadying assist) to squat pivot with min assist to demonstrate to patient various techniques. Pt reports he usually uses public transportation ([place] or [employer]) so he only would do this occasionally. Recommending slideboard at this time for increased independence and safety. Pt request to use bathroom - performed min assist squat pivot transfer onto elevated toilet seat with cues for set-up and hand  placement but able to transfer back to w/c mod I. Pt unable to perform sit <> stand with RW this session despite multiple attempts and pt declined trying again. Returned back to room and reviewed information with family members.   Therapy Documentation Precautions:  Precautions Precautions: Fall Restrictions Weight Bearing Restrictions: Yes LLE Weight Bearing: Non weight bearing   Pain: Premedicated by RN.   See Function Navigator for Current Functional Status.   Therapy/Group: Individual Therapy  [name], PT, DPT  08/29/2015, 1:16 PM

## 2015-08-29 NOTE — Plan of Care (Signed)
Problem: RH Balance Goal: LTG Patient will maintain dynamic standing balance (PT) LTG: Patient will maintain dynamic standing balance with assistance during mobility activities (PT)  Downgraded ABG  Problem: RH Ambulation Goal: LTG Patient will ambulate in controlled environment (PT) LTG: Patient will ambulate in a controlled environment, # of feet with assistance (PT).  Outcome: Not Applicable Date Met:  01/65/53 D/c due to inconsistencies and not recommending at home. ABG Goal: LTG Patient will ambulate in home environment (PT) LTG: Patient will ambulate in home environment, # of feet with assistance (PT).  Outcome: Not Applicable Date Met:  74/82/70 D/c due to not recommending at home. ABG  Problem: RH Stairs Goal: LTG Patient will ambulate up and down stairs w/assist (PT) LTG: Patient will ambulate up and down # of stairs with assistance (PT)  Outcome: Not Applicable Date Met:  78/67/54 D/c due to recommending ramp for home entry. ABG

## 2015-08-29 NOTE — Progress Notes (Signed)
[place] PHYSICAL MEDICINE & REHABILITATION     PROGRESS NOTE  Subjective/Complaints:  No new complaints. Slept well. Wearing stump shrinker.     ROS: Denies CP, SOB, N/V/D.  Objective: Vital Signs: Blood pressure 109/67, pulse 90, temperature 98.4 F (36.9 C), temperature source Oral, resp. rate 18, height 6\' 1"  (1.854 m), weight 81.693 kg (180 lb 1.6 oz), SpO2 97 %. No results found. No results for input(s): WBC, HGB, HCT, PLT in the last 72 hours. No results for input(s): NA, K, CL, GLUCOSE, BUN, CREATININE, CALCIUM in the last 72 hours.  Invalid input(s): CO CBG (last 3)   Recent Labs  08/28/15 1650 08/28/15 2106 08/29/15 0638  GLUCAP 121* 116* 114*    Wt Readings from Last 3 Encounters:  08/28/15 81.693 kg (180 lb 1.6 oz)  08/15/15 90.3 kg (199 lb 1.2 oz)  07/23/15 90.266 kg (199 lb)    Physical Exam:  BP 109/67 mmHg  Pulse 90  Temp(Src) 98.4 F (36.9 C) (Oral)  Resp 18  Ht 6\' 1"  (1.854 m)  Wt 81.693 kg (180 lb 1.6 oz)  BMI 23.77 kg/m2  SpO2 97% Constitutional: He appears well-developed and well-nourished.  HENT: Normocephalic and atraumatic.  Cardiovascular: Normal rate and regular rhythm.  Respiratory: Effort normal and breath sounds normal. No respiratory distress.  GI: Soft. Bowel sounds are normal. He exhibits no distension.  Musculoskeletal: He exhibits edema and mild tenderness. Stump well-shaped Neurological: He is alert and oriented.  Motor: B/l UE, RLE: 5/5 proximal to distal LLE: hip flexion 5/5  Skin: Skin is warm and dry.  Amputation site still with minimal serosanguinous drainage along lateral and medial sides Psychiatric: He has a normal mood and affect. His behavior is normal.   Assessment/Plan: 1. Functional deficits secondary to to left BKA 08/16/2015 after recent left popliteal angioplasty which require 3+ hours per day of interdisciplinary therapy in a comprehensive inpatient rehab setting. Physiatrist is providing close team  supervision and 24 hour management of active medical problems listed below. Physiatrist and rehab team continue to assess barriers to discharge/monitor patient progress toward functional and medical goals.  Function:  Bathing Bathing position   Position: Shower  Bathing parts Body parts bathed by patient: Right arm, Left arm, Abdomen, Chest, Front perineal area, Buttocks, Right upper leg, Right lower leg, Left upper leg, Back Body parts bathed by helper: Back  Bathing assist Assist Level: Supervision or verbal cues      Upper Body Dressing/Undressing Upper body dressing   What is the patient wearing?: Pull over shirt/dress     Pull over shirt/dress - Perfomed by patient: Thread/unthread right sleeve, Thread/unthread left sleeve, Put head through opening, Pull shirt over trunk          Upper body assist Assist Level: Set up   Set up : To obtain clothing/put away  Lower Body Dressing/Undressing Lower body dressing   What is the patient wearing?: Pants, Non-skid slipper socks, Underwear Underwear - Performed by patient: Thread/unthread right underwear leg, Thread/unthread left underwear leg, Pull underwear up/down   Pants- Performed by patient: Thread/unthread right pants leg, Thread/unthread left pants leg, Pull pants up/down Pants- Performed by helper: Pull pants up/down Non-skid slipper socks- Performed by patient: Don/doff right sock                    Lower body assist Assist for lower body dressing: Set up   Set up : To obtain clothing/put away  Toileting Toileting Toileting activity did not  occur: Refused Toileting steps completed by patient: Adjust clothing prior to toileting, Performs perineal hygiene, Adjust clothing after toileting Toileting steps completed by helper: Adjust clothing prior to toileting, Adjust clothing after toileting, Performs perineal hygiene Toileting Assistive Devices: Grab bar or rail  Toileting assist Assist level: Touching or  steadying assistance (Pt.75%)   Transfers Chair/bed transfer   Chair/bed transfer method: Squat pivot Chair/bed transfer assist level: Moderate assist (Pt 50 - 74%/lift or lower) Chair/bed transfer assistive device: Armrests     Locomotion Ambulation     Max distance: 28ft  Assist level: Moderate assist (Pt 50 - 74%)   Wheelchair   Type: Manual Max wheelchair distance: 150' Assist Level: Supervision or verbal cues  Cognition Comprehension Comprehension assist level: Follows complex conversation/direction with no assist  Expression Expression assist level: Expresses complex ideas: With no assist  Social Interaction Social Interaction assist level: Interacts appropriately with others - No medications needed.  Problem Solving Problem solving assist level: Solves complex problems: Recognizes & self-corrects  Memory Memory assist level: Complete Independence: No helper     Medical Problem List and Plan: 1. Decreased functional mobility secondary to left BKA 08/16/2015 after recent left popliteal angioplasty.  Cont CIR  -can use shrinker (carefully) 2. DVT Prophylaxis/Anticoagulation: Lovenox. Monitor for any bleeding episodes. 3. Pain Management: Neurontin 300 mg twice a day, Robaxin and oxycodone as needed 4. Acute blood loss anemia.   Hb 8.4 on 7/14 (stable)  Recheck tomorrow 5. Neuropsych: This patient is capable of making decisions on his own behalf. 6. Skin/Wound Care: Routine skin checks---dry dressing to stump  -change to shrinker later this week 7. Fluids/Electrolytes/Nutrition: Routine I&O's  8. Diabetes mellitus with peripheral neuropathy.Hemoglobin A1c 7.0   Lantus insulin 12 units daily  NovoLog 3 units 3 times a day, increased to 5U on 7/13.   CBGs stable 9. Hypertension. Coreg 3.125 mg twice a day, Lisinopril 20 mg twice a day, HCTZ 12.5 mg twice a day. Monitor decreased mobility 10. Hyperlipidemia. Zocor 11. History CVA 20/15 without residual. Continue  low-dose aspirin 12. BPH. Flomax 0.4 mg daily.  13. History of tobacco/alcohol abuse. Monitor for any signs of withdrawal. Provide counseling 14. Chronic renal insufficiency. Baseline creatinine 1.34.   Cr. 1.18 on 7/15   Cont to monitor 15. Hyponetremia.   Na+ 132 on 7/15  Cont to monitor 16. PVD: Cont meds 17. Hepatitis C: Avoid hepatotoxic meds. 18. SIRS: Resolved  Currently Afebrile  ID signed off  Abx d/ced on 7/13  Cont to monitor  LOS (Days) 8 A FACE TO FACE EVALUATION WAS PERFORMED  Cory Weber T 08/29/2015 8:54 AM

## 2015-08-30 ENCOUNTER — Inpatient Hospital Stay (HOSPITAL_COMMUNITY): Payer: Medicaid Other | Admitting: Occupational Therapy

## 2015-08-30 ENCOUNTER — Inpatient Hospital Stay (HOSPITAL_COMMUNITY): Payer: Medicaid Other | Admitting: Physical Therapy

## 2015-08-30 LAB — CBC AND DIFFERENTIAL
HCT: 28 % — AB (ref 41–53)
Hemoglobin: 8.8 g/dL — AB (ref 13.5–17.5)
PLATELETS: 494 10*3/uL — AB (ref 150–399)
WBC: 7.3 10^3/mL

## 2015-08-30 LAB — CBC
HCT: 27.6 % — ABNORMAL LOW (ref 39.0–52.0)
HEMOGLOBIN: 8.8 g/dL — AB (ref 13.0–17.0)
MCH: 27.6 pg (ref 26.0–34.0)
MCHC: 31.9 g/dL (ref 30.0–36.0)
MCV: 86.5 fL (ref 78.0–100.0)
Platelets: 494 10*3/uL — ABNORMAL HIGH (ref 150–400)
RBC: 3.19 MIL/uL — ABNORMAL LOW (ref 4.22–5.81)
RDW: 14.9 % (ref 11.5–15.5)
WBC: 7.3 10*3/uL (ref 4.0–10.5)

## 2015-08-30 LAB — GLUCOSE, CAPILLARY
GLUCOSE-CAPILLARY: 130 mg/dL — AB (ref 65–99)
GLUCOSE-CAPILLARY: 92 mg/dL (ref 65–99)
GLUCOSE-CAPILLARY: 115 mg/dL — AB (ref 65–99)
GLUCOSE-CAPILLARY: 252 mg/dL — AB (ref 65–99)

## 2015-08-30 MED ORDER — GABAPENTIN 300 MG PO CAPS
300.0000 mg | ORAL_CAPSULE | Freq: Three times a day (TID) | ORAL | Status: DC
  Administered 2015-08-30 – 2015-09-05 (×18): 300 mg via ORAL
  Filled 2015-08-30 (×18): qty 1

## 2015-08-30 NOTE — Progress Notes (Signed)
[place] PHYSICAL MEDICINE & REHABILITATION     PROGRESS NOTE  Subjective/Complaints:  Having more phantom pain LLE. Wearing ACE wrap again. Otherwise feeling well.   ROS: Denies CP, SOB, N/V/D.  Objective: Vital Signs: Blood pressure 171/103, pulse 104, temperature 97.7 F (36.5 C), temperature source Oral, resp. rate 18, height 6\' 1"  (1.854 m), weight 81.693 kg (180 lb 1.6 oz), SpO2 100 %. No results found.  Recent Labs  08/30/15 0641  WBC 7.3  HGB 8.8*  HCT 27.6*  PLT 494*   No results for input(s): NA, K, CL, GLUCOSE, BUN, CREATININE, CALCIUM in the last 72 hours.  Invalid input(s): CO CBG (last 3)   Recent Labs  08/29/15 1631 08/29/15 2121 08/30/15 0640  GLUCAP 168* 104* 130*    Wt Readings from Last 3 Encounters:  08/28/15 81.693 kg (180 lb 1.6 oz)  08/15/15 90.3 kg (199 lb 1.2 oz)  07/23/15 90.266 kg (199 lb)    Physical Exam:  BP 171/103 mmHg  Pulse 104  Temp(Src) 97.7 F (36.5 C) (Oral)  Resp 18  Ht 6\' 1"  (1.854 m)  Wt 81.693 kg (180 lb 1.6 oz)  BMI 23.77 kg/m2  SpO2 100% Constitutional: He appears well-developed and well-nourished.  HENT: Normocephalic and atraumatic.  Cardiovascular: Normal rate and regular rhythm.  Respiratory: Effort normal and breath sounds normal. No respiratory distress.  GI: Soft. Bowel sounds are normal. He exhibits no distension.  Musculoskeletal: He exhibits edema and mild tenderness. Stump well-shaped Neurological: He is alert and oriented.  Motor: B/l UE, RLE: 5/5 proximal to distal LLE: hip flexion 5/5  Skin: Skin is warm and dry.  Amputation site still with minimal serosanguinous drainage along lateral and medial sides Psychiatric: He has a normal mood and affect. His behavior is normal.   Assessment/Plan: 1. Functional deficits secondary to to left BKA 08/16/2015 after recent left popliteal angioplasty which require 3+ hours per day of interdisciplinary therapy in a comprehensive inpatient rehab  setting. Physiatrist is providing close team supervision and 24 hour management of active medical problems listed below. Physiatrist and rehab team continue to assess barriers to discharge/monitor patient progress toward functional and medical goals.  Function:  Bathing Bathing position   Position: Shower  Bathing parts Body parts bathed by patient: Right arm, Left arm, Abdomen, Chest, Front perineal area, Buttocks, Right upper leg, Right lower leg, Left upper leg, Back Body parts bathed by helper: Back  Bathing assist Assist Level: Supervision or verbal cues      Upper Body Dressing/Undressing Upper body dressing   What is the patient wearing?: Pull over shirt/dress     Pull over shirt/dress - Perfomed by patient: Thread/unthread right sleeve, Thread/unthread left sleeve, Put head through opening, Pull shirt over trunk          Upper body assist Assist Level: Set up   Set up : To obtain clothing/put away  Lower Body Dressing/Undressing Lower body dressing   What is the patient wearing?: Pants, Non-skid slipper socks, Underwear Underwear - Performed by patient: Thread/unthread right underwear leg, Thread/unthread left underwear leg, Pull underwear up/down   Pants- Performed by patient: Thread/unthread right pants leg, Thread/unthread left pants leg, Pull pants up/down Pants- Performed by helper: Pull pants up/down Non-skid slipper socks- Performed by patient: Don/doff right sock                    Lower body assist Assist for lower body dressing: Set up   Set up : To obtain  clothing/put away  [employer] activity did not occur: Refused Toileting steps completed by patient: Adjust clothing prior to toileting, Performs perineal hygiene Toileting steps completed by helper: Adjust clothing prior to toileting Toileting Assistive Devices: Grab bar or rail  Toileting assist Assist level: Touching or steadying assistance (Pt.75%)   Transfers Chair/bed  transfer   Chair/bed transfer method: Squat pivot Chair/bed transfer assist level: Touching or steadying assistance (Pt > 75%) Chair/bed transfer assistive device: Armrests     Locomotion Ambulation     Max distance: 53ft  Assist level: Touching or steadying assistance (Pt > 75%)   Wheelchair   Type: Manual Max wheelchair distance: 150' Assist Level: Supervision or verbal cues  Cognition Comprehension Comprehension assist level: Follows complex conversation/direction with no assist  Expression Expression assist level: Expresses complex ideas: With no assist  Social Interaction Social Interaction assist level: Interacts appropriately with others with medication or extra time (anti-anxiety, antidepressant).  Problem Solving Problem solving assist level: Solves basic problems with no assist  Memory Memory assist level: Recognizes or recalls 90% of the time/requires cueing < 10% of the time     Medical Problem List and Plan: 1. Decreased functional mobility secondary to left BKA 08/16/2015 after recent left popliteal angioplasty.  Cont CIR  -ACE 2. DVT Prophylaxis/Anticoagulation: Lovenox. Monitor for any bleeding episodes. 3. Pain Management: Neurontin 300 mg--increase to TID to better cover phantom pain  -encourage massage, visual feedback  - Robaxin and oxycodone as needed 4. Acute blood loss anemia.   Hb 8.8 today---all labs personally reviewed    5. Neuropsych: This patient is capable of making decisions on his own behalf. 6. Skin/Wound Care: Routine skin checks---dry dressing to stump  -prefer to wait on shrinker until drainage stops 7. Fluids/Electrolytes/Nutrition: Routine I&O's  8. Diabetes mellitus with peripheral neuropathy.Hemoglobin A1c 7.0   Lantus insulin 12 units daily  NovoLog 3 units 3 times a day, increased to 5U on 7/13.   CBGs improved 9. Hypertension. Coreg 3.125 mg twice a day, Lisinopril 20 mg twice a day, HCTZ 12.5 mg twice a day. Monitor decreased  mobility 10. Hyperlipidemia. Zocor 11. History CVA 20/15 without residual. Continue low-dose aspirin 12. BPH. Flomax 0.4 mg daily.  13. History of tobacco/alcohol abuse. Monitor for any signs of withdrawal. Provide counseling 14. Chronic renal insufficiency. Baseline creatinine 1.34.   Cr. 1.18 on 7/15   Cont to monitor 15. Hyponatremia.   Na+ 132 on 7/15  Cont to monitor 16. PVD: Cont meds 17. Hepatitis C: Avoid hepatotoxic meds. 18. SIRS: Resolved  Currently Afebrile  ID signed off  Abx d/ced on 7/13  Cont to monitor  LOS (Days) 9 A FACE TO FACE EVALUATION WAS PERFORMED  SWARTZ,ZACHARY T 08/30/2015 9:29 AM

## 2015-08-30 NOTE — Progress Notes (Signed)
Social Work Patient ID: Cory Weber, male   DOB: April 02, 1950, 65 y.o.   MRN: [id number]  Met yesterday and today with pt and daughter to review team conference.  Both aware and agreeable with targeted d/c date of 7/25.  Daughter has been trying to reach landlord about need for ramp and has finally made contact but is concerned this may not be in place by d/c date.  Will monitor.  Discussed plan for this SW to follow up with current [facility] aide agency and request increased hours. Pt much brighter than the first interaction.  Continue to follow.  Hermelinda Diegel, LCSW

## 2015-08-30 NOTE — Progress Notes (Signed)
Physical Therapy Session Note  Patient Details  Name: Cory Weber MRN: [id number] Date of Birth: 1950/02/28  Today's Date: 08/30/2015 PT Individual Time: 0801-0900 PT Individual Time Calculation (min): 59 min   Short Term Goals: Week 1:  PT Short Term Goal 1 (Week 1): Patient will perform Squat pivot transfer with mod A.  PT Short Term Goal 1 - Progress (Week 1): Met PT Short Term Goal 2 (Week 1): Patient will ambulate with RW for 15 ft with mod A  PT Short Term Goal 2 - Progress (Week 1): Not met PT Short Term Goal 3 (Week 1): Patient will perform WC mobillity for 150 ft  with supervision A PT Short Term Goal 3 - Progress (Week 1): Met PT Short Term Goal 4 (Week 1): Patient will initiate stair training.  PT Short Term Goal 4 - Progress (Week 1): Not met Week 2:  PT Short Term Goal 1 (Week 2): Pt will perform basic transfers with supervision PT Short Term Goal 2 (Week 2): Pt will progress sit <> stands with min assist with RW PT Short Term Goal 3 (Week 2): Plan will be in place for ramp to be built for home access  Skilled Therapeutic Interventions/Progress Updates:    Patient received sitting EOB with trade off from [name].  Patient instructed in [place] mobility for 168f, 1735f and 803fith supervision A. PT also instructed patient in [place] in [id number] community environment inclduing cement sidewalk for [id number] 45f PT provided mod cues on uneven surface for improved use of BUE to prevent loss of control on downhill grade and to avoid obstacles on L side with increased use of L UE  Sit<>stand x3 from WC witRice Medical Centermod A from PT as well as mod cues for improved UE placement to maintain COM over R LE and improve safety of transfers. Squat pivot transfer training with minA-supervision A from PT with mod cues for improved hips/head relationship and increased gluteal clearance to improve safty of transfer.   Therex. Mini sqauat with BUE support on RW with min A from PT for improved ROM  and proper technique. RLE LAQ x10 with level 2 tband, siting BLE hip abduction x 12 with level 2 tabnd, seated RLE Hip/knee extension x10 with level 2 tband.   Patient left supine in bed following min A squat pivot transfer and sit>supine with increase time and effort but no assist from PT.      Therapy Documentation Precautions:  Precautions Precautions: Fall Restrictions Weight Bearing Restrictions: Yes LLE Weight Bearing: Non weight bearing    Vital Signs: Therapy Vitals Temp: 97.7 F (36.5 C) Temp Source: Oral Pulse Rate: (!) 104 Resp: 18 BP: (!) 171/103 mmHg Patient Position (if appropriate): Lying Oxygen Therapy SpO2: 100 % O2 Device: Not Delivered Pain: 2/10   See Function Navigator for Current Functional Status.   Therapy/Group: Individual Therapy  AustinLorie Phenix2017, 9:02 AM

## 2015-08-30 NOTE — Progress Notes (Signed)
Occupational Therapy Weekly Progress Note  Patient Details  Name: Cory Weber MRN: [id number] Date of Birth: [birth year]  Beginning of progress report period: August 22, 2015 End of progress report period: August 30, 2015  Today's Date: 08/30/2015 OT Individual Time: 1000 -1100 and 1345-1415 OT Time: 60 min and 30 min      Patient has met 4 of 4 short term goals.  Pt met short term goals and progressing towards long term goals. However, home environment presents challenges upon d/c. Pt unable to get into bathroom from w/c at home and currently only has access to standard [facility] due to receiving one during recent hospitalization. Pt is currently inconsistent with toilet transfers ranging from mod I to mod A.   Patient continues to demonstrate the following deficits: abnormal posture and muscle weakness (generalized) and therefore will continue to benefit from skilled OT intervention to enhance overall performance with BADL, iADL and Reduce care partner burden.  Patient progressing toward long term goals. Goals upgraded see POC   OT Short Term Goals Week 1:  OT Short Term Goal 1 (Week 1): Pt will perform toilet transfer with mod A in order to decrease level of assist with functional transfers.  OT Short Term Goal 1 - Progress (Week 1): Met OT Short Term Goal 2 (Week 1): Pt will perform shower transfer with mod A in order to decrease level of assist with functional transfers. OT Short Term Goal 2 - Progress (Week 1): Met OT Short Term Goal 3 (Week 1): Pt will perform LB dressing with min A in order to increase I with self care.  OT Short Term Goal 3 - Progress (Week 1): Met OT Short Term Goal 4 (Week 1): Pt will perform toileting with min A in order to increase I with self care.  OT Short Term Goal 4 - Progress (Week 1): Met  Skilled Therapeutic Interventions/Progress Updates:   Session One:  Pt seen for skilled OT session focusing on self care. Pt sitting EOB upon arrival and agreeable to tx  session. Pt denied shower stating nursing helped him get washed up. Pt transferred from bed to w/c via squat pivot and min A. Pt self propelled w/c to kitchen to make oatmeal from w/c level in preparation for going home. OT educated on w/c mobility in the kitchen, kitchen modifications and energy conservation. Pt receives meals on wheels and lives across from soup kitchen so meals will be delivered to him. Therefore session focused on utilizing microwave and retrieving items from fridge. Pt self propelled back to room.   OT provided education to pt to keep residual limb on leg rest to prevent harm and allow correct shaping. Pt complained of residual limb support being too low therefore OT wrapped another towel around residual limb support with pt verbalizing increased comfort.  Pt left with all needs in reach and reminded to call nursing if he needs to stand.   Session Two: Pt seen for skilled OT session focusing on self care. Pt sitting EOB with daughter present and agreeable to tx session upon arrival. Pt requested wrapping residual limb. OT suggested taking pictures during session. After demonstration from OT pt able to wrap limb with mod VC to cover residual limb completely and not to leave wrinkles. After 2 more attempts pt able to wrap residual limb with 2 VC. Continue to practice in future sessions. OT educated pt on setting up [place] next to bed at home. Pt practiced transferring 2x from bed to [facility]  with close supervision. Pt required VC to raise buttocks to prevent shearing. Pt left sitting EOB with daughter present and all needs met.   Therapy Documentation Precautions:  Precautions Precautions: Fall Restrictions Weight Bearing Restrictions: Yes LLE Weight Bearing: Non weight bearing Pain: Pain Assessment Pain Score: 0-No pain Faces Pain Scale: No hurt  See Function Navigator for Current Functional Status.   Therapy/Group: Individual Therapy  Matilde Bash 08/30/2015, 7:01 AM

## 2015-08-30 NOTE — Plan of Care (Signed)
Problem: RH Tub/Shower Transfers Goal: LTG Patient will perform tub/shower transfers w/assist (OT) LTG: Patient will perform tub/shower transfers with assist, with/without cues using equipment (OT)  Outcome: Not Applicable Date Met:  14/10/30 Discharged due to inaccessible bathroom at home [facility]

## 2015-08-30 NOTE — Patient Care Conference (Signed)
Inpatient RehabilitationTeam Conference and Plan of Care Update Date: 08/29/2015   Time: 2:10 PM    Patient Name: Cory Weber      Medical Record Number: [id number]  Date of Birth: [birth year] Sex: Male         Room/Bed: 4M05C/4M05C-01 Payor Info: Payor: MEDICAID [place] / Plan: MEDICAID [place] ACCESS / Product Type: *No Product type* /    Admitting Diagnosis: BKA  Admit Date/Time:  08/21/2015  3:08 PM Admission Comments: No comment available   Primary Diagnosis:  <principal problem not specified> Principal Problem: <principal problem not specified>  Patient Active Problem List   Diagnosis Date Noted  . Pyrexia   . Amputation of left lower extremity below knee ([place]) 08/21/2015  . Abnormality of gait   . Phantom limb pain ([place])   . Anemia of chronic disease   . DM type 2 with diabetic peripheral neuropathy ([place])   . HLD (hyperlipidemia)   . BPH (benign prostatic hyperplasia)   . ETOH abuse   . CKD (chronic kidney disease)   . PVD (peripheral vascular disease) ([place])   . Chronic hepatitis C without hepatic coma ([place])   . History of CVA (cerebrovascular accident) without residual deficits   . Leukocytosis   . SIRS (systemic inflammatory response syndrome) ([facility])   . Tachycardia   . Hyponatremia   . Diabetic wet gangrene of the foot ([place]) 08/15/2015  . AKI (acute kidney injury) ([place])   . Atherosclerosis of left lower extremity with ulceration of midfoot ([place])   . Left foot infection 07/23/2015  . Type 2 diabetes mellitus with vascular disease ([place]) 07/23/2015  . Diabetes mellitus with foot ulcer, without long-term current use of insulin ([place]) 07/18/2015  . Chronic kidney disease, stage 3 07/18/2015  . Essential hypertension 07/18/2015  . Dyspepsia 04/13/2014  . Acute blood loss anemia 12/01/2013  . Constipation 12/01/2013  . Radiculopathy of lumbar region 11/29/2013  . Acute right lumbar radiculopathy 11/26/2013  . S/P lumbar spinal fusion 11/25/2013  . Lumbar spinal  stenosis 11/24/2013  . Right leg weakness 11/23/2013  . PUD (peptic ulcer disease) 11/19/2012  . IDA (iron deficiency anemia) 07/10/2012  . Unspecified constipation 05/13/2012  . Normocytic anemia 12/03/2011  . Melena 12/03/2011  . DM (diabetes mellitus), type 2, uncontrolled ([place]) 02/03/2011  . Accelerated hypertension 02/03/2011  . Hemiparesthesia 02/03/2011  . Polysubstance abuse 02/03/2011  . Tobacco abuse 02/03/2011    Expected Discharge Date: Expected Discharge Date: 09/05/15  Team Members Present: Physician leading conference: Dr. Alger Simons Social Worker Present: Lennart Pall, LCSW Nurse Present: Dorien Chihuahua, RN PT Present: [name], PT OT Present: Napoleon Form, OT SLP Present: Gunnar Fusi, SLP PPS Coordinator present : Daiva Nakayama, RN, CRRN     Current Status/Progress Goal Weekly Team Focus  Medical   weaing stump shrinker, wound healing. pain under better control  improve pt awareness of stump care  skin mgt, edema control   Bowel/Bladder   Cont Bowel and bladder LBM 7/17  Continue to cont Bowel and bladder; assist patient with toileting, if needed.   Nursing staff will continue to monitor and assist with toileting needs.    Swallow/Nutrition/ Hydration             ADL's   min-mod squat pivot shower transfers TTB, min-mod sit to stand to donn pants, min A squat pivots BSC toilet transfer  Supervision-Mod I  Functional transfers, ADL re-training, standing balance and family education   Mobility   min assist w/c level;  limited gait/standing (mod A); supervision w/c mobility  mod I w/c level; min assist ambulatory level; mod assist stairs  d/c planning, standing balance, transfers, endurance, strengthening, gait as able   Communication             Safety/Cognition/ Behavioral Observations            Pain   Pt continue to experience pain L BKA   Maintain pain at a level of zero to 3, or tolerable; administered pain regimen as order.   Nursing staff will  continue to administer pain regimen as order; as pain level as needed and observed for any noted nonverbal behavior notes signs of pain    Skin   surgical incision L BKA  Remain free of infection and maintain proper hand hygiene   Nursing staff will continue to assess skin, educate patient on importance of assess skin as well.     Rehab Goals Patient on target to meet rehab goals: Yes *See Care Plan and progress notes for long and short-term goals.  Barriers to Discharge: safety awareness, familiarity with shrinker    Possible Resolutions to Barriers:  ongoing pre-prosthetic education and training    Discharge Planning/Teaching Needs:  Home with only intermittent assistance available.  To be determined   Team Discussion:  Wound still with some drainage;  BP running low and need to push fluids.  Inconsistent with transfers (esp toilet tfs).  Making progress and expect will need transfer board.  Must have ramped entry and pt/fam report they have spoken with landlord.  Dtr here from [place] and getting home measurements to determine access issues if any.  Needs drop arm commode and SW to follow up.  Revisions to Treatment Plan:  None   Continued Need for Acute Rehabilitation Level of Care: The patient requires daily medical management by a physician with specialized training in physical medicine and rehabilitation for the following conditions: Daily direction of a multidisciplinary physical rehabilitation program to ensure safe treatment while eliciting the highest outcome that is of practical value to the patient.: Yes Daily medical management of patient stability for increased activity during participation in an intensive rehabilitation regime.: Yes Daily analysis of laboratory values and/or radiology reports with any subsequent need for medication adjustment of medical intervention for : Wound care problems;Post surgical problems;Renal problems  Cory Weber 08/30/2015, 6:31 PM

## 2015-08-30 NOTE — Progress Notes (Signed)
Physical Therapy Session Note  Patient Details  Name: Cory Weber MRN: [id number] Date of Birth: 03-Apr-1950  Today's Date: 08/30/2015 PT Individual Time: 1435-1530 PT Individual Time Calculation (min): 55 min   Short Term Goals: Week 2:  PT Short Term Goal 1 (Week 2): Pt will perform basic transfers with supervision PT Short Term Goal 2 (Week 2): Pt will progress sit <> stands with min assist with RW PT Short Term Goal 3 (Week 2): Plan will be in place for ramp to be built for home access  Skilled Therapeutic Interventions/Progress Updates:    Pt received in bed & agreeable to PT, noting 7/10 LLE pain but premedicated & RN made aware. Pt able to transfer supine>sitting EOB with mod I & use of bed features. PT provided total A for w/c set up & pt completed lateral scoot bed>w/c with close supervision. Pt propelled w/c x 100 ft room>gym with supervision/mod I and use of BUE. Pt reports he takes public transportation but willing to perform car transfer. Car set at [employer] simulated height & pt performed squat pivot w/c<>car with steady A, cuing for hand placement & technique. Pt with difficulty lifting buttocks enough to clear w/c cushion. After task pt noted dizziness & BP found to be 91/59 mmHg; RN notified. After prolonged rest break & performing BUE shoulder flexion BP = 97/59 mmHg & pt denying dizziness. Pt provided HEP & pt performed the following LLE exercises: seated long arc quads, supine quad sets with 5 second hold, glute sets, adduction squeezes with pillow, isometric hip extension, and sidelying hip abduction BLE. PT provided multimodal cuing for proper technique.  Pt propelled w/c back to room & left with all needs met & daughter present.  During session pt educated on LLE desensitization techniques to help reduce sensation of phantom pain.  Therapy Documentation Precautions:  Precautions Precautions: Fall Restrictions Weight Bearing Restrictions: Yes LLE Weight Bearing: Non  weight bearing  Pain: Pain Assessment Pain Assessment: 0-10 Pain Score: 7  Pain Location:  (residual limb) Pain Orientation: Left Pain Intervention(s): RN made aware   See Function Navigator for Current Functional Status.   Therapy/Group: Individual Therapy  Waunita Schooner 08/30/2015, 1:19 PM

## 2015-08-31 ENCOUNTER — Inpatient Hospital Stay (HOSPITAL_COMMUNITY): Payer: Medicaid Other

## 2015-08-31 ENCOUNTER — Inpatient Hospital Stay (HOSPITAL_COMMUNITY): Payer: Medicaid Other | Admitting: Occupational Therapy

## 2015-08-31 ENCOUNTER — Telehealth: Payer: Self-pay | Admitting: Surgery

## 2015-08-31 LAB — GLUCOSE, CAPILLARY
Glucose-Capillary: 148 mg/dL — ABNORMAL HIGH (ref 65–99)
Glucose-Capillary: 126 mg/dL — ABNORMAL HIGH (ref 65–99)
Glucose-Capillary: 204 mg/dL — ABNORMAL HIGH (ref 65–99)
Glucose-Capillary: 176 mg/dL — ABNORMAL HIGH (ref 65–99)

## 2015-08-31 NOTE — Progress Notes (Signed)
Occupational Therapy Session Note  Patient Details  Name: Cory Weber MRN: [id number] Date of Birth: October 09, 1950  Today's Date: 08/31/2015 OT Individual Time: 0805-0900 OT Individual Time Calculation (min): 55 min   Short Term Goals: Week 1:  OT Short Term Goal 1 (Week 1): Pt will perform toilet transfer with mod A in order to decrease level of assist with functional transfers.  OT Short Term Goal 1 - Progress (Week 1): Met OT Short Term Goal 2 (Week 1): Pt will perform shower transfer with mod A in order to decrease level of assist with functional transfers. OT Short Term Goal 2 - Progress (Week 1): Met OT Short Term Goal 3 (Week 1): Pt will perform LB dressing with min A in order to increase I with self care.  OT Short Term Goal 3 - Progress (Week 1): Met OT Short Term Goal 4 (Week 1): Pt will perform toileting with min A in order to increase I with self care.  OT Short Term Goal 4 - Progress (Week 1): Met   Week 2:  OT Short Term Goal 1 (Week 2): STG=LTG due to LOS  Skilled Therapeutic Interventions/Progress Updates:  Patient found seated EOB with 7/10 complaints of pain in left residual limb, pt reports he recently took medication for pain. Patient eager and willing to shower this morning. Therapist wrapped left residual limb to prevent it from getting wet. Therapist then handed patient his clothes and pt picked out clothes to wear today. Pt transferred EOB to w/c, squat pivot with min guard assist. Therapist propelled pt into BR and pt doffed clothes in seated position, then transferred w/c to tub transfer bench in walk-in shower with min guard assist. UB/LB bathing completed in seated position at distant supervision level, pt preferred his privacy. Pt dried self, then completed UB/LB dressing in shower (lateral leans). Pt propelled self out of shower to room to don lotion and complete grooming tasks seated at sink. From here, pt propelled self to laundry room to wash clothes. On the  way to do laundry, pt with request to use BR. Pt transferred on/off BSC seated over toilet seat in tub room with supervision and min verbal cues for safety with w/c set-up. Focused skilled intervention on functional transfers, functional mobility, w/c propulsion, functional strengthening, ADL retraining, and overall activity tolerance/endurance. At end of session, left pt supine in bed with all needs within reach. Laundry sign hung outside of room.   Therapy Documentation Precautions:  Precautions Precautions: Fall Restrictions Weight Bearing Restrictions: Yes LLE Weight Bearing: Non weight bearing  Vital Signs: Therapy Vitals Temp: 97.5 F (36.4 C) Temp Source: Oral Pulse Rate: 82 Resp: 18 BP: 106/73 mmHg Patient Position (if appropriate): Lying Oxygen Therapy SpO2: 100 % O2 Device: Not Delivered  See Function Navigator for Current Functional Status.  Therapy/Group: Individual Therapy  Chrys Racer , MS, OTR/L, CLT  08/31/2015, 9:07 AM

## 2015-08-31 NOTE — Progress Notes (Signed)
Physical Therapy Session Note  Patient Details  Name: Cory Weber MRN: [id number] Date of Birth: [birth year]  Today's Date: 08/31/2015 PT Individual Time: 1000-1100 PT Individual Time Calculation (min): 60 min   Short Term Goals: Week 2:  PT Short Term Goal 1 (Week 2): Pt will perform basic transfers with supervision PT Short Term Goal 2 (Week 2): Pt will progress sit <> stands with min assist with RW PT Short Term Goal 3 (Week 2): Plan will be in place for ramp to be built for home access  Skilled Therapeutic Interventions/Progress Updates:   Session focused on functional transfer training, home management at w/c level for laundry task, w/c mobility on unit (mod I), bed mobility, and reviewed HEP issued yesterday (required cues for technique). Pt required supervision to min assist for transfers with cues for efficient technique to clear bottom during transfers though demonstrating improved management of legrests (min verbal cues). Pt instructed in exercises to address hip, knee, and core strength and ROM with 10 reps each in supine, sidelying and prone positions.   Therapy Documentation Precautions:  Precautions Precautions: Fall Restrictions Weight Bearing Restrictions: Yes LLE Weight Bearing: Non weight bearing  Pain: Premedicated.   See Function Navigator for Current Functional Status.   Therapy/Group: Individual Therapy  [name], PT, DPT  08/31/2015, 12:15 PM

## 2015-08-31 NOTE — Telephone Encounter (Signed)
-----   Message from Mena Goes, RN sent at 08/21/2015  1:11 PM EDT ----- Regarding: at least 4 weeks post BKA    ----- Message -----    From: Gabriel Earing, PA-C    Sent: 08/21/2015  11:48 AM      To: [facility] Charge Pool  S/p left BKA.  F/u with Dr. Trula Slade in 4 weeks.  Thanks, Aldona Bar

## 2015-08-31 NOTE — Progress Notes (Signed)
Physical Therapy Session Note  Patient Details  Name: Cory Weber MRN: [id number] Date of Birth: December 20, 1950  Today's Date: 08/31/2015 PT Individual Time: 1445-1530 PT Individual Time Calculation (min): 45 min   Short Term Goals: Week 2:  PT Short Term Goal 1 (Week 2): Pt will perform basic transfers with supervision PT Short Term Goal 2 (Week 2): Pt will progress sit <> stands with min assist with RW PT Short Term Goal 3 (Week 2): Plan will be in place for ramp to be built for home access  Skilled Therapeutic Interventions/Progress Updates:    Session focused on functional squat pivot transfers with close supervision to steadying assist, UE and LE therex for functional strengthening to aid with mobility, sit <> stands with RW from EOB, and short distance gait with RW. Pt able to demonstrate sit <> stands x 4 reps with supervision to min assist and dynamic standing balance while performing reaching activity with min assist for balance. Pt hopped x 5' with min assist with RW and verbal cues for efficient use of UE's to advance LE.   Therapy Documentation Precautions:  Precautions Precautions: Fall Restrictions Weight Bearing Restrictions: Yes LLE Weight Bearing: Non weight bearing  Pain: Notified RN for pain medication at end of session.   See Function Navigator for Current Functional Status.   Therapy/Group: Individual Therapy  [name], PT, DPT  08/31/2015, 3:51 PM

## 2015-08-31 NOTE — Progress Notes (Signed)
Occupational Therapy Session Note  Patient Details  Name: [name] MRN: [id number] Date of Birth: 1950/09/03  Today's Date: 08/31/2015 OT Individual Time: 1300-1330 OT Individual Time Calculation (min): 30 min    Short Term Goals: Week 2:  OT Short Term Goal 1 (Week 2): STG=LTG due to LOS  Skilled Therapeutic Interventions/Progress Updates:    Pt seen for OT session focusing on functional transfers and homemaking goals. Pt sitting up in w/c upon arrival, sister present however did not stay for tx session.  He voiced need for toileting task and propelled w/c to bathroom and was correctly able to set-up w/c in prep for toilet transfer. He completed squat pivot transfer to toilet and lateral leans completed for clothing management. He requires distant supervision while sitting on toilet as pt with decreased safety awareness and will attempt clothing management and transfer without calling for assist. He attempted to stand with use of grab bars to complete clothing management, requiring heavy steadying assist from OT. Pt attempting to restL residual limb on w/c cushion for steadying support, reviewed NWB precautions. Pt unreceptive to OT recommendation of completing lateral leans to pull pants back up. Stand pivot completed back to w/c. Cont to educate pt on safer toileting methods and high fall risk. Pt stated "I think it will take a fall for me to learn my lesson". Stressed to pt dangers of falling and risking further injuring LE, he voiced understanding, however, would cont to benefit from education.  He self propelled w/c to on-unit laundry facility, requiring rst breaks throughout due to decreased functional activity tolerance. He gathered laundry items from dryer at supervision- mod I level seated in w/c. He returned to room and left sitting in w/c folding laundry. Educated regarding use of call bell and need for assist with transfers. Required cuing throughout session to keep L residual  limb on amputee support pad, pt leaves LE dangling to side during rest and when propelling w/c.   Therapy Documentation Precautions:  Precautions Precautions: Fall Restrictions Weight Bearing Restrictions: Yes LLE Weight Bearing: Non weight bearing Pain: Pain Assessment Pain Assessment: 0-10 Pain Score: 2  Patients Stated Pain Goal: 2  See Function Navigator for Current Functional Status.   Therapy/Group: Individual Therapy  Lewis, Niraj Kudrna C 08/31/2015, 7:06 AM

## 2015-08-31 NOTE — Telephone Encounter (Signed)
Sched appt 8/7 at 3:45. Spoke to pt, pt still in hosp, will call back to get appt.

## 2015-08-31 NOTE — Progress Notes (Signed)
PHYSICAL MEDICINE & REHABILITATION     PROGRESS NOTE  Subjective/Complaints:  Phantom pain better. Up in bed eating breakfast.  ROS: Denies CP, SOB, N/V/D.  Objective: Vital Signs: Blood pressure 106/73, pulse 82, temperature 97.5 F (36.4 C), temperature source Oral, resp. rate 18, height 6\' 1"  (1.854 m), weight 83.008 kg (183 lb), SpO2 100 %. No results found.  Recent Labs  08/30/15 0641  WBC 7.3  HGB 8.8*  HCT 27.6*  PLT 494*   No results for input(s): NA, K, CL, GLUCOSE, BUN, CREATININE, CALCIUM in the last 72 hours.  Invalid input(s): CO CBG (last 3)   Recent Labs  08/30/15 1635 08/30/15 2049 08/31/15 0634  GLUCAP 115* 252* 148*    Wt Readings from Last 3 Encounters:  08/31/15 83.008 kg (183 lb)  08/15/15 90.3 kg (199 lb 1.2 oz)  07/23/15 90.266 kg (199 lb)    Physical Exam:  BP 106/73 mmHg  Pulse 82  Temp(Src) 97.5 F (36.4 C) (Oral)  Resp 18  Ht 6\' 1"  (1.854 m)  Wt 83.008 kg (183 lb)  BMI 24.15 kg/m2  SpO2 100% Constitutional: He appears well-developed and well-nourished.  HENT: Normocephalic and atraumatic.  Cardiovascular: Normal rate and regular rhythm.  Respiratory: Effort normal and breath sounds normal. No respiratory distress.  GI: Soft. Bowel sounds are normal. He exhibits no distension.  Musculoskeletal: He exhibits edema and mild tenderness. Stump well-shaped Neurological: He is alert and oriented.  Motor: B/l UE, RLE: 5/5 proximal to distal LLE: hip flexion 5/5  Skin: Skin is warm and dry.  Amputation site still with minimal serosanguinous drainage along lateral and medial sides Psychiatric: He has a normal mood and affect. His behavior is normal.   Assessment/Plan: 1. Functional deficits secondary to to left BKA 08/16/2015 after recent left popliteal angioplasty which require 3+ hours per day of interdisciplinary therapy in a comprehensive inpatient rehab setting. Physiatrist is providing close team supervision and  24 hour management of active medical problems listed below. Physiatrist and rehab team continue to assess barriers to discharge/monitor patient progress toward functional and medical goals.  Function:  Bathing Bathing position   Position: Shower  Bathing parts Body parts bathed by patient: Right arm, Left arm, Abdomen, Chest, Front perineal area, Buttocks, Right upper leg, Right lower leg, Left upper leg, Back Body parts bathed by helper: Back  Bathing assist Assist Level: Supervision or verbal cues      Upper Body Dressing/Undressing Upper body dressing   What is the patient wearing?: Pull over shirt/dress     Pull over shirt/dress - Perfomed by patient: Thread/unthread right sleeve, Thread/unthread left sleeve, Put head through opening, Pull shirt over trunk          Upper body assist Assist Level: Set up   Set up : To obtain clothing/put away  Lower Body Dressing/Undressing Lower body dressing   What is the patient wearing?: Pants, Non-skid slipper socks, Underwear Underwear - Performed by patient: Thread/unthread right underwear leg, Thread/unthread left underwear leg, Pull underwear up/down   Pants- Performed by patient: Thread/unthread right pants leg, Thread/unthread left pants leg, Pull pants up/down Pants- Performed by helper: Pull pants up/down Non-skid slipper socks- Performed by patient: Don/doff right sock                    Lower body assist Assist for lower body dressing: Set up   Set up : To obtain clothing/put away  Toileting Toileting Toileting activity did not occur: Refused  Toileting steps completed by patient: Adjust clothing prior to toileting, Performs perineal hygiene Toileting steps completed by helper: Adjust clothing prior to toileting Toileting Assistive Devices: Grab bar or rail  Toileting assist Assist level: Touching or steadying assistance (Pt.75%)   Transfers Chair/bed transfer   Chair/bed transfer method: Squat pivot Chair/bed  transfer assist level: Touching or steadying assistance (Pt > 75%) Chair/bed transfer assistive device: Armrests     Locomotion Ambulation     Max distance: 35ft  Assist level: Touching or steadying assistance (Pt > 75%)   Wheelchair   Type: Manual Max wheelchair distance: 200 ft Assist Level: Supervision or verbal cues  Cognition Comprehension Comprehension assist level: Follows complex conversation/direction with no assist  Expression Expression assist level: Expresses complex ideas: With no assist  Social Interaction Social Interaction assist level: Interacts appropriately with others with medication or extra time (anti-anxiety, antidepressant).  Problem Solving Problem solving assist level: Solves basic problems with no assist  Memory Memory assist level: Recognizes or recalls 90% of the time/requires cueing < 10% of the time     Medical Problem List and Plan: 1. Decreased functional mobility secondary to left BKA 08/16/2015 after recent left popliteal angioplasty.  Cont CIR  -ACE 2. DVT Prophylaxis/Anticoagulation: Lovenox. Monitor for any bleeding episodes. 3. Pain Management: Neurontin 300 mg--increase to TID to better cover phantom pain  -encourage massage, visual feedback  - Robaxin and oxycodone as needed 4. Acute blood loss anemia.   Hb 8.8     5. Neuropsych: This patient is capable of making decisions on his own behalf. 6. Skin/Wound Care: Routine skin checks---dry dressing to stump  -prefer to wait on shrinker until drainage stops 7. Fluids/Electrolytes/Nutrition: Routine I&O's  8. Diabetes mellitus with peripheral neuropathy.Hemoglobin A1c 7.0   Lantus insulin 12 units daily  NovoLog 3 units 3 times a day, increased to 5U on 7/13---reasonable control.   CBGs improved 9. Hypertension. Coreg 3.125 mg twice a day, Lisinopril 20 mg twice a day, HCTZ 12.5 mg twice a day. Monitor decreased mobility 10. Hyperlipidemia. Zocor 11. History CVA 20/15 without residual.  Continue low-dose aspirin 12. BPH. Flomax 0.4 mg daily.  13. History of tobacco/alcohol abuse. Monitor for any signs of withdrawal. Provide counseling 14. Chronic renal insufficiency. Baseline creatinine 1.34.   Cr. 1.18 on 7/15   Cont to monitor 15. Hyponatremia.   Na+ 132 on 7/15---recheck tomorrow  Cont to monitor 16. PVD: Cont meds 17. Hepatitis C: Avoid hepatotoxic meds. 18. SIRS: Resolved  Currently Afebrile  ID signed off  Abx d/ced on 7/13  Cont to monitor  LOS (Days) 10 A FACE TO FACE EVALUATION WAS PERFORMED  Littie Chiem T 08/31/2015 8:34 AM

## 2015-08-31 NOTE — Plan of Care (Signed)
Problem: RH KNOWLEDGE DEFICIT LIMB LOSS Goal: RH STG INCREASE KNOWLEDGE OF SELF CARE AFTER LIMB LOSS Patient with increase knowledge of self-care after limb loss- stump wrapping, medications, and treatment and moderate assistance.  Outcome: Not Progressing Patient is not compliance with his Diabetes; He was drinking regular soda; he was educated and he expresses understanding.

## 2015-09-01 ENCOUNTER — Inpatient Hospital Stay (HOSPITAL_COMMUNITY): Payer: Medicaid Other

## 2015-09-01 ENCOUNTER — Inpatient Hospital Stay (HOSPITAL_COMMUNITY): Payer: Medicaid Other | Admitting: Occupational Therapy

## 2015-09-01 LAB — GLUCOSE, CAPILLARY
GLUCOSE-CAPILLARY: 134 mg/dL — AB (ref 65–99)
GLUCOSE-CAPILLARY: 154 mg/dL — AB (ref 65–99)
Glucose-Capillary: 69 mg/dL (ref 65–99)
Glucose-Capillary: 108 mg/dL — ABNORMAL HIGH (ref 65–99)

## 2015-09-01 LAB — BASIC METABOLIC PANEL
ANION GAP: 5 (ref 5–15)
BUN: 33 mg/dL — ABNORMAL HIGH (ref 6–20)
BUN: 33 mg/dL — AB (ref 4–21)
CALCIUM: 9.7 mg/dL (ref 8.9–10.3)
CHLORIDE: 101 mmol/L (ref 101–111)
CO2: 28 mmol/L (ref 22–32)
CREATININE: 1.3 mg/dL (ref 0.6–1.3)
CREATININE: 1.26 mg/dL — AB (ref 0.61–1.24)
GFR calc Af Amer: >60 mL/min (ref >60)
GFR calc non Af Amer: 59 mL/min — ABNORMAL LOW (ref >60)
GLUCOSE: 128 mg/dL
GLUCOSE: 128 mg/dL — AB (ref 65–99)
POTASSIUM: 5.5 mmol/L — AB (ref 3.4–5.3)
Potassium: 5.5 mmol/L — ABNORMAL HIGH (ref 3.5–5.1)
Sodium: 134 mmol/L — AB (ref 137–147)
Sodium: 134 mmol/L — ABNORMAL LOW (ref 135–145)

## 2015-09-01 LAB — CBC
HEMATOCRIT: 27.6 % — AB (ref 39.0–52.0)
HEMOGLOBIN: 8.7 g/dL — AB (ref 13.0–17.0)
MCH: 27.7 pg (ref 26.0–34.0)
MCHC: 31.5 g/dL (ref 30.0–36.0)
MCV: 87.9 fL (ref 78.0–100.0)
Platelets: 456 10*3/uL — ABNORMAL HIGH (ref 150–400)
RBC: 3.14 MIL/uL — AB (ref 4.22–5.81)
RDW: 15.1 % (ref 11.5–15.5)
WBC: 8.1 10*3/uL (ref 4.0–10.5)

## 2015-09-01 LAB — CBC AND DIFFERENTIAL: WBC: 8.1 10*3/mL

## 2015-09-01 MED ORDER — SODIUM POLYSTYRENE SULFONATE 15 GM/60ML PO SUSP
15.0000 g | Freq: Once | ORAL | Status: AC
  Administered 2015-09-01: 15 g via ORAL
  Filled 2015-09-01: qty 60

## 2015-09-01 MED ORDER — INSULIN GLARGINE 100 UNIT/ML ~~LOC~~ SOLN
14.0000 [IU] | Freq: Every day | SUBCUTANEOUS | Status: DC
  Administered 2015-09-02: 14 [IU] via SUBCUTANEOUS
  Filled 2015-09-01 (×2): qty 0.14

## 2015-09-01 NOTE — Progress Notes (Signed)
Physical Therapy Session Note  Patient Details  Name: LINKIN GERGES MRN: [id number] Date of Birth: 1950/09/17  Today's Date: 09/01/2015 PT Individual Time: 0958-1100 PT Individual Time Calculation (min): 62 min   Short Term Goals: Week 2:  PT Short Term Goal 1 (Week 2): Pt will perform basic transfers with supervision PT Short Term Goal 2 (Week 2): Pt will progress sit <> stands with min assist with RW PT Short Term Goal 3 (Week 2): Plan will be in place for ramp to be built for home access  Skilled Therapeutic Interventions/Progress Updates:    Premedicated for pain in residual limb. Session focused on functional transfers with focus on technique (verbal cues for safe set-up and technique), w/c mobility and parts management, dynamic standing balance activities while performing functional reaching tasks with 1 UE support, Nustep for general strengthening and endurance (x 5 min on level 4 with residual limb propped off to the side), and gait training with RW (forward x 5' and backwards x 5' with overall min assist and verbal cues for efficient technique x 2 reps). Pt continues to require verbal cues for safe transfers.   Therapy Documentation Precautions:  Precautions Precautions: Fall Restrictions Weight Bearing Restrictions: Yes LLE Weight Bearing: Non weight bearing   See Function Navigator for Current Functional Status.   Therapy/Group: Individual Therapy  [name], PT, DPT  09/01/2015, 12:07 PM

## 2015-09-01 NOTE — Plan of Care (Signed)
Problem: RH Bed to Chair Transfers Goal: LTG Patient will perform bed/chair transfers w/assist (PT) LTG: Patient will perform bed/chair transfers with assistance, with/without cues (PT).  Downgraded for cues for safety ABG

## 2015-09-01 NOTE — Progress Notes (Signed)
When RN offer patient sorbitol PRN for constipation, he refuses and said he had BM during day shift but the day shift nurse and tech were asked they said they have no knowledge of patient having BM. Patient was educated about him not having BM and not wanting to take the med and he verbalizes understanding.

## 2015-09-01 NOTE — Progress Notes (Signed)
Occupational Therapy Session Note  Patient Details  Name: [name] MRN: [id number] Date of Birth: March 02, 1950  Today's Date: 09/01/2015 OT Individual Time: 1300-1330 OT Individual Time Calculation (min): 30 min    Short Term Goals: Week 2:  OT Short Term Goal 1 (Week 2): STG=LTG due to LOS  Skilled Therapeutic Interventions/Progress Updates:    Pt seen for skilled OT session focusing on self care. Pt sitting EOB and agreeable to tx session upon arrival. Pt transferred to w/c via squat pivot with min A. Pt propelled w/c to bathroom and after demonstration from OT pt transferred to bariatric drop arm commode using sliding board. OT educated pt on the advantages of using sliding board transfer to bariatric drop arm commode and feels this will be the safest technique and pt agreed. Pt performed lateral leans needing VC to doff/donn pants once on commode. Pt unable to void again this session therefore pt transferred to w/c via sliding board and supervision and propelled w/c to room. Pt transferred to bed and left with all needs met.   Therapy Documentation Precautions:  Precautions Precautions: Fall Restrictions Weight Bearing Restrictions: Yes LLE Weight Bearing: Non weight bearing Pain:  Pt denies pain   See Function Navigator for Current Functional Status.   Therapy/Group: Individual Therapy  Matilde Bash 09/01/2015, 2:37 PM

## 2015-09-01 NOTE — Plan of Care (Signed)
Problem: RH PAIN MANAGEMENT Goal: RH STG PAIN MANAGED AT OR BELOW PT'S PAIN GOAL < 4  Outcome: Not Progressing Patient continue to constantly complains of pain of 9/10  Problem: RH KNOWLEDGE DEFICIT LIMB LOSS Goal: RH STG INCREASE KNOWLEDGE OF SELF CARE AFTER LIMB LOSS Patient with increase knowledge of self-care after limb loss- stump wrapping, medications, and treatment and moderate assistance.  Outcome: Not Progressing Patient not compliance with his diabetes precaution. He still continue to drink regular soda despite all the teachings.

## 2015-09-01 NOTE — Progress Notes (Signed)
Social Work Patient ID: Cory Weber, male   DOB: 1950-12-16, 65 y.o.   MRN: [id number]   Met with pt and have spoken with daughter with both agreed that pt does not have the assistance he will need at home and they wish to change d/c plan to SNF.  Bed search underway.  Ayjah Show, LCSW

## 2015-09-01 NOTE — Progress Notes (Addendum)
PHYSICAL MEDICINE & REHABILITATION     PROGRESS NOTE  Subjective/Complaints Had questions about shrinker again  ROS: Denies CP, SOB, N/V/D.  Objective: Vital Signs: Blood pressure 112/73, pulse 86, temperature 97.8 F (36.6 C), temperature source Oral, resp. rate 18, height 6\' 1"  (1.854 m), weight 83 kg (182 lb 15.7 oz), SpO2 100 %. No results found.  Recent Labs  08/30/15 0641 09/01/15 0519  WBC 7.3 8.1  HGB 8.8* 8.7*  HCT 27.6* 27.6*  PLT 494* 456*    Recent Labs  09/01/15 0519  NA 134*  K 5.5*  CL 101  GLUCOSE 128*  BUN 33*  CREATININE 1.26*  CALCIUM 9.7   CBG (last 3)   Recent Labs  08/31/15 1132 08/31/15 1635 08/31/15 2101  GLUCAP 126* 204* 176*    Wt Readings from Last 3 Encounters:  09/01/15 83 kg (182 lb 15.7 oz)  08/15/15 90.3 kg (199 lb 1.2 oz)  07/23/15 90.266 kg (199 lb)    Physical Exam:  BP 112/73 mmHg  Pulse 86  Temp(Src) 97.8 F (36.6 C) (Oral)  Resp 18  Ht 6\' 1"  (1.854 m)  Wt 83 kg (182 lb 15.7 oz)  BMI 24.15 kg/m2  SpO2 100% Constitutional: He appears well-developed and well-nourished.  HENT: Normocephalic and atraumatic.  Cardiovascular: Normal rate and regular rhythm.  Respiratory: Effort normal and breath sounds normal. No respiratory distress.  GI: Soft. Bowel sounds are normal. He exhibits no distension.  Musculoskeletal: He exhibits edema and mild tenderness. Stump well-shaped Neurological: He is alert and oriented.  Motor: B/l UE, RLE: 5/5 proximal to distal LLE: hip flexion 5/5  Skin: Skin is warm and dry.  Amputation site still with minimal serosanguinous drainage along lateral and medial sides Psychiatric: He has a normal mood and affect. His behavior is normal.   Assessment/Plan: 1. Functional deficits secondary to to left BKA 08/16/2015 after recent left popliteal angioplasty which require 3+ hours per day of interdisciplinary therapy in a comprehensive inpatient rehab setting. Physiatrist is  providing close team supervision and 24 hour management of active medical problems listed below. Physiatrist and rehab team continue to assess barriers to discharge/monitor patient progress toward functional and medical goals.  Function:  Bathing Bathing position   Position: Shower  Bathing parts Body parts bathed by patient: Right arm, Left arm, Abdomen, Chest, Front perineal area, Buttocks, Right upper leg, Right lower leg, Left upper leg, Back Body parts bathed by helper: Back  Bathing assist Assist Level: Supervision or verbal cues      Upper Body Dressing/Undressing Upper body dressing   What is the patient wearing?: Pull over shirt/dress     Pull over shirt/dress - Perfomed by patient: Thread/unthread right sleeve, Thread/unthread left sleeve, Put head through opening, Pull shirt over trunk          Upper body assist Assist Level: Set up   Set up : To obtain clothing/put away  Lower Body Dressing/Undressing Lower body dressing   What is the patient wearing?: Pants, Non-skid slipper socks Underwear - Performed by patient: Thread/unthread right underwear leg, Thread/unthread left underwear leg, Pull underwear up/down   Pants- Performed by patient: Thread/unthread right pants leg, Thread/unthread left pants leg, Pull pants up/down Pants- Performed by helper: Pull pants up/down Non-skid slipper socks- Performed by patient: Don/doff right sock                    Lower body assist Assist for lower body dressing: Set up, Supervision or  verbal cues   Set up : To obtain clothing/put away  Toileting Toileting Toileting activity did not occur: N/A Toileting steps completed by patient: Adjust clothing prior to toileting, Performs perineal hygiene, Adjust clothing after toileting Toileting steps completed by helper: Adjust clothing prior to toileting [place]: Grab bar or rail  Toileting assist Assist level: Touching or steadying assistance (Pt.75%)    Transfers Chair/bed transfer   Chair/bed transfer method: Squat pivot Chair/bed transfer assist level: Touching or steadying assistance (Pt > 75%) Chair/bed transfer assistive device: Armrests     Locomotion Ambulation     Max distance: 31ft  Assist level: Touching or steadying assistance (Pt > 75%)   Wheelchair   Type: Manual Max wheelchair distance: 150' Assist Level: No help, No cues, assistive device, takes more than reasonable amount of time  Cognition Comprehension Comprehension assist level: Follows complex conversation/direction with no assist  Expression Expression assist level: Expresses complex ideas: With no assist  Social Interaction Social Interaction assist level: Interacts appropriately with others - No medications needed.  Problem Solving Problem solving assist level: Solves complex problems: Recognizes & self-corrects  Memory Memory assist level: Complete Independence: No helper     Medical Problem List and Plan: 1. Decreased functional mobility secondary to left BKA 08/16/2015 after recent left popliteal angioplasty.  Cont CIR  2. DVT Prophylaxis/Anticoagulation: Lovenox. Monitor for any bleeding episodes. 3. Pain Management: Neurontin 300 mg--increase to TID to better cover phantom pain  -encourage massage, visual feedback  - Robaxin and oxycodone as needed 4. Acute blood loss anemia.   Hb 8.7 today--labs reviewed     5. Neuropsych: This patient is capable of making decisions on his own behalf. 6. Skin/Wound Care: Routine skin checks---dry dressing to stump  -have converted to shrinker 7. Fluids/Electrolytes/Nutrition: Routine I&O's  8. Diabetes mellitus with peripheral neuropathy.Hemoglobin A1c 7.0   Lantus insulin 12 units daily---increase to 14u today  NovoLog 3 units 3 times a day, increased to 5U on 7/13--  CBGs improved 9. Hypertension. Coreg 3.125 mg twice a day, Lisinopril 20 mg twice a day, HCTZ 12.5 mg twice a day. Monitor decreased  mobility 10. Hyperlipidemia. Zocor 11. History CVA 20/15 without residual. Continue low-dose aspirin 12. BPH. Flomax 0.4 mg daily.  13. History of tobacco/alcohol abuse. Monitor for any signs of withdrawal. Provide counseling 14. Chronic renal insufficiency. Baseline creatinine 1.34.   Cr. 1.26 today.   -K+ 5.5---give kayexalate today, back of ACE?  -recheck labs tomorrow 15. Hyponatremia.   Na+ 134 today  Cont to monitor 16. PVD: Cont meds 17. Hepatitis C: Avoid hepatotoxic meds. 18. SIRS: Resolved  Currently Afebrile  ID signed off  Abx d/ced on 7/13  Cont to monitor  LOS (Days) 11 A FACE TO FACE EVALUATION WAS PERFORMED  SWARTZ,ZACHARY T 09/01/2015 9:03 AM

## 2015-09-01 NOTE — Progress Notes (Signed)
Occupational Therapy Session Note  Patient Details  Name: Cory Weber MRN: [id number] Date of Birth: [birth year]  Today's Date: 09/01/2015 OT Individual Time: 0800-0900 OT Individual Time Calculation (min): 60 min    Short Term Goals: Week 2:  OT Short Term Goal 1 (Week 2): STG=LTG due to LOS  Skilled Therapeutic Interventions/Progress Updates:    Pt seen for skilled OT session focusing on self care. Pt sitting EOB eating breakfast upon arrival, but agreeable to tx session. Pt had shrinker donned, but unsure if he should continue with this or ACE wrap. Therefore, MD consulted and stated he can continue to wear shrinker at all times. Pt practiced donning shrinker needing VC to pull above his knee. Pt required mod VC stating what he should look for when inspecting residual limb. Pt was provided inspection mirror.  Pt washed up and donned/doffed clothes sitting EOB with setup performing lateral leans to pull up/down pants and wash buttocks. Pt transferred to w/c via squat pivot with close supervision and performed grooming at the sink in w/c. Pt practiced standing at sink approximately 2 minutes with min A for balance alternating using BUE  for support to improve dynamic standing balance. Pt requested to use bathroom. Pt transferred to toilet using grab bars and mod A needing VC to perform lateral leans to doff/donn pants once on toilet. Pt unable to void. Pt transferred back to w/c with mod A and propelled back to room. Pt left with all needs met and RN present.   Therapy Documentation Precautions:  Precautions Precautions: Fall Restrictions Weight Bearing Restrictions: Yes LLE Weight Bearing: Non weight bearing Pain: Pain Assessment Pain Assessment: 0-10 Pain Score: 8  Pain Type: Surgical pain Pain Location: Leg Pain Orientation: Left Pain Descriptors / Indicators: Aching Pain Frequency: Constant Pain Onset: On-going Patients Stated Pain Goal: 2 Pain Intervention(s): Medication  (See eMAR)  See Function Navigator for Current Functional Status.   Therapy/Group: Individual Therapy  Matilde Bash 09/01/2015, 10:03 AM

## 2015-09-02 ENCOUNTER — Inpatient Hospital Stay (HOSPITAL_COMMUNITY): Payer: Medicaid Other

## 2015-09-02 LAB — BASIC METABOLIC PANEL
ANION GAP: 6 (ref 5–15)
BUN: 27 mg/dL — ABNORMAL HIGH (ref 6–20)
BUN: 27 mg/dL — AB (ref 4–21)
CALCIUM: 9.7 mg/dL (ref 8.9–10.3)
CO2: 28 mmol/L (ref 22–32)
CREATININE: 1.2 mg/dL (ref 0.6–1.3)
Chloride: 102 mmol/L (ref 101–111)
Creatinine, Ser: 1.18 mg/dL (ref 0.61–1.24)
GFR calc Af Amer: >60 mL/min (ref >60)
GFR calc non Af Amer: >60 mL/min (ref >60)
GLUCOSE: 115 mg/dL
GLUCOSE: 115 mg/dL — AB (ref 65–99)
Potassium: 5.3 mmol/L (ref 3.4–5.3)
Potassium: 5.3 mmol/L — ABNORMAL HIGH (ref 3.5–5.1)
SODIUM: 136 mmol/L — AB (ref 137–147)
Sodium: 136 mmol/L (ref 135–145)

## 2015-09-02 LAB — GLUCOSE, CAPILLARY
GLUCOSE-CAPILLARY: 185 mg/dL — AB (ref 65–99)
Glucose-Capillary: 111 mg/dL — ABNORMAL HIGH (ref 65–99)
Glucose-Capillary: 134 mg/dL — ABNORMAL HIGH (ref 65–99)
Glucose-Capillary: 153 mg/dL — ABNORMAL HIGH (ref 65–99)

## 2015-09-02 MED ORDER — LISINOPRIL 10 MG PO TABS
10.0000 mg | ORAL_TABLET | Freq: Two times a day (BID) | ORAL | Status: DC
  Administered 2015-09-02 (×2): 10 mg via ORAL
  Filled 2015-09-02 (×2): qty 1

## 2015-09-02 NOTE — Progress Notes (Signed)
Physical Therapy Session Note  Patient Details  Name: Cory Weber MRN: [id number] Date of Birth: 25-Sep-1950  Today's Date: 09/02/2015 PT Individual Time: 0900-0958 PT Individual Time Calculation (min): 58 min   Short Term Goals: Week 2:  PT Short Term Goal 1 (Week 2): Pt will perform basic transfers with supervision PT Short Term Goal 2 (Week 2): Pt will progress sit <> stands with min assist with RW PT Short Term Goal 3 (Week 2): Plan will be in place for ramp to be built for home access  Skilled Therapeutic Interventions/Progress Updates:   Practiced donning and doffing shrinker as pt asked PT to change dressing on residual limb. RN observed and no noted drainage noted but dressing was falling off. Pt able to don shrinker with verbal cues. Toilet transfers using slideboard to drop arm BSC supervision overall for safety with verbal cues for safe set-up. Pt independent at w/c level to empty contents of BSC. Set-up for bathing at sink level from w/c with education and discussion in regards to plan for d/c, energy conservation, and home set-up once patient acquires his new residence. Pt able to dress at w/c level without assist including gathering clothing himself. Verbal cues for safe use of brakes when performing lateral leans or lifting up bottom. Left up at sink to finish hygiene tasks and call bell in reach.  Therapy Documentation Precautions:  Precautions Precautions: Fall Restrictions Weight Bearing Restrictions: Yes LLE Weight Bearing: Non weight bearing  Pain: Pain medication received at start of session.  See Function Navigator for Current Functional Status.   Therapy/Group: Individual Therapy  [name], PT, DPT  09/02/2015, 10:38 AM

## 2015-09-02 NOTE — Progress Notes (Signed)
[facility] PHYSICAL MEDICINE & REHABILITATION     PROGRESS NOTE  Subjective/Complaints Had questions about shrinker again  ROS: Denies CP, SOB, N/V/D.  Objective: Vital Signs: Blood pressure 110/67, pulse 82, temperature 98.5 F (36.9 C), temperature source Oral, resp. rate 18, height 6\' 1"  (1.854 m), weight 83 kg (182 lb 15.7 oz), SpO2 97 %. No results found.  Recent Labs  08/30/15 0641 09/01/15 0519  WBC 7.3 8.1  HGB 8.8* 8.7*  HCT 27.6* 27.6*  PLT 494* 456*    Recent Labs  09/01/15 0519  NA 134*  K 5.5*  CL 101  GLUCOSE 128*  BUN 33*  CREATININE 1.26*  CALCIUM 9.7   CBG (last 3)   Recent Labs  09/01/15 1449 09/01/15 1710 09/01/15 2044  GLUCAP 134* 154* 108*    Wt Readings from Last 3 Encounters:  09/01/15 83 kg (182 lb 15.7 oz)  08/15/15 90.3 kg (199 lb 1.2 oz)  07/23/15 90.266 kg (199 lb)    Physical Exam:  BP 110/67 mmHg  Pulse 82  Temp(Src) 98.5 F (36.9 C) (Oral)  Resp 18  Ht 6\' 1"  (1.854 m)  Wt 83 kg (182 lb 15.7 oz)  BMI 24.15 kg/m2  SpO2 97% Constitutional: He appears well-developed and well-nourished.  HENT: Normocephalic and atraumatic.  Cardiovascular: Normal rate and regular rhythm.  Respiratory: Effort normal and breath sounds normal. No respiratory distress.  GI: Soft. Bowel sounds are normal. He exhibits no distension.  Musculoskeletal: He exhibits edema and mild tenderness. Stump well-shaped Neurological: He is alert and oriented.  Motor: B/l UE, RLE: 5/5 proximal to distal LLE: hip flexion 5/5  Skin: Skin is warm and dry.  Amputation site still with minimal serosanguinous drainage along lateral and medial sides Psychiatric: He has a normal mood and affect. His behavior is normal.   Assessment/Plan: 1. Functional deficits secondary to to left BKA 08/16/2015 after recent left popliteal angioplasty which require 3+ hours per day of interdisciplinary therapy in a comprehensive inpatient rehab setting. Physiatrist is  providing close team supervision and 24 hour management of active medical problems listed below. Physiatrist and rehab team continue to assess barriers to discharge/monitor patient progress toward functional and medical goals.  Function:  Bathing Bathing position   Position: Sitting EOB  Bathing parts Body parts bathed by patient: Right arm, Left arm, Abdomen, Chest, Front perineal area, Buttocks, Right upper leg, Right lower leg, Left upper leg, Back Body parts bathed by helper: Back  Bathing assist Assist Level: Set up      Upper Body Dressing/Undressing Upper body dressing   What is the patient wearing?: Pull over shirt/dress     Pull over shirt/dress - Perfomed by patient: Thread/unthread right sleeve, Thread/unthread left sleeve, Put head through opening, Pull shirt over trunk          Upper body assist Assist Level: Set up   Set up : To obtain clothing/put away  Lower Body Dressing/Undressing Lower body dressing   What is the patient wearing?: Pants, Non-skid slipper socks Underwear - Performed by patient: Thread/unthread right underwear leg, Thread/unthread left underwear leg, Pull underwear up/down   Pants- Performed by patient: Thread/unthread right pants leg, Thread/unthread left pants leg, Pull pants up/down Pants- Performed by helper: Pull pants up/down Non-skid slipper socks- Performed by patient: Don/doff right sock Non-skid slipper socks- Performed by helper: Don/doff right sock                  Lower body assist Assist for lower  body dressing: Supervision or verbal cues   Set up : To obtain clothing/put away  Toileting Toileting Toileting activity did not occur: N/A Toileting steps completed by patient: Adjust clothing prior to toileting, Adjust clothing after toileting Toileting steps completed by helper: Adjust clothing prior to toileting Toileting Assistive Devices: Grab bar or rail  Toileting assist Assist level: More than reasonable time    Transfers Chair/bed transfer   Chair/bed transfer method: Squat pivot Chair/bed transfer assist level: Touching or steadying assistance (Pt > 75%) Chair/bed transfer assistive device: Armrests     Locomotion Ambulation     Max distance: 10' Assist level: Touching or steadying assistance (Pt > 75%)   Wheelchair   Type: Manual Max wheelchair distance: 150' Assist Level: No help, No cues, assistive device, takes more than reasonable amount of time  Cognition Comprehension Comprehension assist level: Follows complex conversation/direction with no assist  Expression Expression assist level: Expresses complex ideas: With no assist  Social Interaction Social Interaction assist level: Interacts appropriately with others - No medications needed.  Problem Solving Problem solving assist level: Solves complex problems: Recognizes & self-corrects  Memory Memory assist level: Complete Independence: No helper     Medical Problem List and Plan: 1. Decreased functional mobility secondary to left BKA 08/16/2015 after recent left popliteal angioplasty.  Cont CIR, PT/OT  2. DVT Prophylaxis/Anticoagulation: Lovenox. Monitor for any bleeding episodes. 3. Pain Management: Neurontin 300 mg--increase to TID to better cover phantom pain  -encourage massage, visual feedback  - Robaxin and oxycodone as needed 4. Acute blood loss anemia.   Hb 8.7 today--labs reviewed     5. Neuropsych: This patient is capable of making decisions on his own behalf. 6. Skin/Wound Care: Routine skin checks---dry dressing to stump  -have converted to shrinker 7. Fluids/Electrolytes/Nutrition: Routine I&O's  8. Diabetes mellitus with peripheral neuropathy.Hemoglobin A1c 7.0   Lantus insulin 12 units daily---increase to 14u today  NovoLog 3 units 3 times a day, increased to 5U on 7/13--   CBG (last 3)   Recent Labs  09/01/15 1449 09/01/15 1710 09/01/15 2044  GLUCAP 134* 154* 108*    9. Hypertension. Coreg  3.125 mg twice a day, Lisinopril 20 mg twice a day reduce to 10mg  given soft BPs, HCTZ 12.5 mg twice a day. Monitor for hypotension  Filed Vitals:   09/01/15 1443 09/02/15 0506  BP: 118/67 110/67  Pulse: 83 82  Temp: 97.8 F (36.6 C) 98.5 F (36.9 C)  Resp: 18 18    10. Hyperlipidemia. Zocor 11. History CVA 20/15 without residual. Continue low-dose aspirin 12. BPH. Flomax 0.4 mg daily.  13. History of tobacco/alcohol abuse. Monitor for any signs of withdrawal. Provide counseling 14. Chronic renal insufficiency. Baseline creatinine 1.34.   Cr. 1.26 today.   -K+ 5.5---give kayexalate 7/21  -recheck BMET today 15. Hyponatremia.   Na+ 134 today  Cont to monitor 16. PVD: Cont meds 17. Hepatitis C: Avoid hepatotoxic meds. 18. SIRS: Resolved  Currently Afebrile  ID signed off  Abx d/ced on 7/13  Cont to monitor  LOS (Days) 12 A FACE TO FACE EVALUATION WAS PERFORMED  Alysia Penna E 09/02/2015 6:14 AM

## 2015-09-03 ENCOUNTER — Inpatient Hospital Stay (HOSPITAL_COMMUNITY): Payer: Medicaid Other | Admitting: *Deleted

## 2015-09-03 DIAGNOSIS — E875 Hyperkalemia: Secondary | ICD-10-CM

## 2015-09-03 LAB — GLUCOSE, CAPILLARY
GLUCOSE-CAPILLARY: 83 mg/dL (ref 65–99)
GLUCOSE-CAPILLARY: 293 mg/dL — AB (ref 65–99)
GLUCOSE-CAPILLARY: 129 mg/dL — AB (ref 65–99)
Glucose-Capillary: 178 mg/dL — ABNORMAL HIGH (ref 65–99)

## 2015-09-03 MED ORDER — LISINOPRIL 5 MG PO TABS
5.0000 mg | ORAL_TABLET | Freq: Two times a day (BID) | ORAL | Status: DC
  Administered 2015-09-03 – 2015-09-05 (×5): 5 mg via ORAL
  Filled 2015-09-03 (×5): qty 1

## 2015-09-03 MED ORDER — SODIUM POLYSTYRENE SULFONATE 15 GM/60ML PO SUSP
30.0000 g | Freq: Once | ORAL | Status: AC
  Administered 2015-09-03: 30 g via ORAL
  Filled 2015-09-03: qty 120

## 2015-09-03 MED ORDER — INSULIN GLARGINE 100 UNIT/ML ~~LOC~~ SOLN
17.0000 [IU] | Freq: Every day | SUBCUTANEOUS | Status: DC
  Administered 2015-09-03 – 2015-09-05 (×3): 17 [IU] via SUBCUTANEOUS
  Filled 2015-09-03 (×4): qty 0.17

## 2015-09-03 NOTE — Progress Notes (Signed)
Physical Therapy Session Note  Patient Details  Name: MAKAY SCHEIDT MRN: [id number] Date of Birth: [birth year]  Today's Date: 09/03/2015 PT Individual Time: [id number] PT Individual Time Calculation (min): 45 min     Skilled Therapeutic Interventions/Progress Updates:  Patient in room, in bed, agrees to session. Initiated tx with rolling in bed and coming to sit EOB with Supervision. Wrapping applied to residual limb and patient instructed in donning shrinker -supervision. Transfer to w/c with squat pivot with supervision. Patient very distracted with what he needs to do before therapy-needs increased redirections. Standing by the sink to brush teeth and hands -Supervision to min A due to LOB in posterior direction. Transfer to [facility] with Supervision. Patient delays leaving the room,diverts attention from therapy, difficult to redirect. Agrees to w/c mobility training 2 x 200 feet with B UE, min A for managing parts. At the end of session patient left in room with all needs within reach .   Therapy Documentation Precautions:  Precautions Precautions: Fall Restrictions Weight Bearing Restrictions: Yes LLE Weight Bearing: Non weight bearing   See Function Navigator for Current Functional Status.   Therapy/Group: Individual Therapy  Guadlupe Spanish 09/03/2015, 12:16 PM

## 2015-09-03 NOTE — Progress Notes (Signed)
[place] PHYSICAL MEDICINE & REHABILITATION     PROGRESS NOTE  Subjective/Complaints Discussed K level  ROS: Denies CP, SOB, N/V/D.  Objective: Vital Signs: Blood pressure 115/68, pulse 69, temperature 97.3 F (36.3 C), temperature source Oral, resp. rate 17, height 6\' 1"  (1.854 m), weight 83 kg (182 lb 15.7 oz), SpO2 100 %. No results found.  Recent Labs  09/01/15 0519  WBC 8.1  HGB 8.7*  HCT 27.6*  PLT 456*    Recent Labs  09/01/15 0519 09/02/15 0617  NA 134* 136  K 5.5* 5.3*  CL 101 102  GLUCOSE 128* 115*  BUN 33* 27*  CREATININE 1.26* 1.18  CALCIUM 9.7 9.7   CBG (last 3)   Recent Labs  09/02/15 1645 09/02/15 2145 09/03/15 0708  GLUCAP 185* 153* 178*    Wt Readings from Last 3 Encounters:  09/01/15 83 kg (182 lb 15.7 oz)  08/15/15 90.3 kg (199 lb 1.2 oz)  07/23/15 90.3 kg (199 lb)    Physical Exam:  BP 115/68 (BP Location: Left Arm)   Pulse 69   Temp 97.3 F (36.3 C) (Oral)   Resp 17   Ht 6\' 1"  (1.854 m)   Wt 83 kg (182 lb 15.7 oz)   SpO2 100%   BMI 24.14 kg/m  Constitutional: He appears well-developed and well-nourished.  HENT: Normocephalic and atraumatic.  Cardiovascular: Normal rate and regular rhythm.  Respiratory: Effort normal and breath sounds normal. No respiratory distress.  GI: Soft. Bowel sounds are normal. He exhibits no distension.  Musculoskeletal: He exhibits edema and mild tenderness. Stump well-shaped Neurological: He is alert and oriented.  Motor: B/l UE, RLE: 5/5 proximal to distal LLE: hip flexion 5/5  Skin: Skin is warm and dry.  Amputation site still with minimal serosanguinous drainage along lateral and medial sides Psychiatric: He has a normal mood and affect. His behavior is normal.   Assessment/Plan: 1. Functional deficits secondary to to left BKA 08/16/2015 after recent left popliteal angioplasty which require 3+ hours per day of interdisciplinary therapy in a comprehensive inpatient rehab  setting. Physiatrist is providing close team supervision and 24 hour management of active medical problems listed below. Physiatrist and rehab team continue to assess barriers to discharge/monitor patient progress toward functional and medical goals.  Function:  Bathing Bathing position   Position: Wheelchair/chair at sink  Bathing parts Body parts bathed by patient: Right arm, Left arm, Abdomen, Chest, Front perineal area, Buttocks, Right upper leg, Right lower leg, Left upper leg, Back Body parts bathed by helper: Back  Bathing assist Assist Level: Set up      Upper Body Dressing/Undressing Upper body dressing   What is the patient wearing?: Pull over shirt/dress     Pull over shirt/dress - Perfomed by patient: Thread/unthread right sleeve, Thread/unthread left sleeve, Put head through opening, Pull shirt over trunk          Upper body assist Assist Level: More than reasonable time   Set up : To obtain clothing/put away  Lower Body Dressing/Undressing Lower body dressing   What is the patient wearing?: Pants, Non-skid slipper socks Underwear - Performed by patient: Thread/unthread right underwear leg, Thread/unthread left underwear leg, Pull underwear up/down   Pants- Performed by patient: Thread/unthread right pants leg, Thread/unthread left pants leg, Pull pants up/down Pants- Performed by helper: Pull pants up/down Non-skid slipper socks- Performed by patient: Don/doff right sock Non-skid slipper socks- Performed by helper: Don/doff right sock  Lower body assist Assist for lower body dressing: More than reasonable time   Set up : To obtain clothing/put away  Toileting Toileting Toileting activity did not occur: N/A Toileting steps completed by patient: Adjust clothing prior to toileting, Adjust clothing after toileting, Performs perineal hygiene Toileting steps completed by helper: Adjust clothing prior to toileting Toileting Assistive Devices:  Grab bar or rail  Toileting assist Assist level: More than reasonable time   Transfers Chair/bed transfer   Chair/bed transfer method: Squat pivot Chair/bed transfer assist level: Supervision or verbal cues Chair/bed transfer assistive device: Armrests     Locomotion Ambulation     Max distance: 10' Assist level: Touching or steadying assistance (Pt > 75%)   Wheelchair   Type: Manual Max wheelchair distance: 22' in room Assist Level: No help, No cues, assistive device, takes more than reasonable amount of time  Cognition Comprehension Comprehension assist level: Follows complex conversation/direction with no assist  Expression Expression assist level: Expresses complex ideas: With no assist  Social Interaction Social Interaction assist level: Interacts appropriately with others - No medications needed.  Problem Solving Problem solving assist level: Solves complex problems: Recognizes & self-corrects  Memory Memory assist level: Complete Independence: No helper     Medical Problem List and Plan: 1. Decreased functional mobility secondary to left BKA 08/16/2015 after recent left popliteal angioplasty.  Cont CIR, PT/OT  2. DVT Prophylaxis/Anticoagulation: Lovenox. Monitor for any bleeding episodes. 3. Pain Management: Neurontin 300 mg--increase to TID to better cover phantom pain  -encourage massage, visual feedback  - Robaxin and oxycodone as needed 4. Acute blood loss anemia.   Hb 8.7     5. Neuropsych: This patient is capable of making decisions on his own behalf. 6. Skin/Wound Care: Routine skin checks---dry dressing to stump  -have converted to shrinker 7. Fluids/Electrolytes/Nutrition: Routine I&O's  8. Diabetes mellitus with peripheral neuropathy.Hemoglobin A1c 7.0   Lantus insulin 12 units daily---increase to 17u today  NovoLog 3 units 3 times a day, increased to 5U on 7/13--   CBG (last 3)   Recent Labs  09/02/15 1645 09/02/15 2145 09/03/15 0708  GLUCAP  185* 153* 178*    9. Hypertension. Coreg 3.125 mg twice a day, Lisinopril 10 mg twice a day reduce to 5mg  given soft BPs, HCTZ 12.5 mg twice a day. Monitor for hypotension  Vitals:   09/02/15 2131 09/03/15 0615  BP: 119/72 115/68  Pulse:  69  Resp:  17  Temp:  97.3 F (36.3 C)    10. Hyperlipidemia. Zocor 11. History CVA 20/15 without residual. Continue low-dose aspirin 12. BPH. Flomax 0.4 mg daily.  13. History of tobacco/alcohol abuse. Monitor for any signs of withdrawal. Provide counseling 14. Chronic renal insufficiency. Baseline creatinine 1.34.   Cr. 1.26 today.   -K+ 5.3---give kayexalate 7/21, repeat 30g today  -recheck BMET tomorrow 15. Hyponatremia.   Na+ 134 today  Cont to monitor 16. PVD: Cont meds 17. Hepatitis C: Avoid hepatotoxic meds. 18. SIRS: Resolved  Currently Afebrile  ID signed off  Abx d/ced on 7/13  Cont to monitor  LOS (Days) 13 A FACE TO FACE EVALUATION WAS PERFORMED  Charlett Blake 09/03/2015 7:42 AM

## 2015-09-04 ENCOUNTER — Inpatient Hospital Stay (HOSPITAL_COMMUNITY): Payer: Medicaid Other

## 2015-09-04 ENCOUNTER — Inpatient Hospital Stay (HOSPITAL_COMMUNITY): Payer: Medicaid Other | Admitting: Occupational Therapy

## 2015-09-04 ENCOUNTER — Inpatient Hospital Stay (HOSPITAL_COMMUNITY): Payer: Medicaid Other | Admitting: Physical Therapy

## 2015-09-04 LAB — BASIC METABOLIC PANEL
ANION GAP: 5 (ref 5–15)
BUN: 24 mg/dL — AB (ref 4–21)
BUN: 24 mg/dL — ABNORMAL HIGH (ref 6–20)
CALCIUM: 9.5 mg/dL (ref 8.9–10.3)
CO2: 28 mmol/L (ref 22–32)
Chloride: 101 mmol/L (ref 101–111)
Creatinine, Ser: 1.02 mg/dL (ref 0.61–1.24)
Creatinine: 1 mg/dL (ref 0.6–1.3)
GLUCOSE: 117 mg/dL
Glucose, Bld: 117 mg/dL — ABNORMAL HIGH (ref 65–99)
POTASSIUM: 4.7 mmol/L (ref 3.5–5.1)
SODIUM: 134 mmol/L — AB (ref 135–145)
Sodium: 134 mmol/L — AB (ref 137–147)

## 2015-09-04 LAB — GLUCOSE, CAPILLARY
GLUCOSE-CAPILLARY: 112 mg/dL — AB (ref 65–99)
GLUCOSE-CAPILLARY: 75 mg/dL (ref 65–99)
GLUCOSE-CAPILLARY: 106 mg/dL — AB (ref 65–99)
Glucose-Capillary: 154 mg/dL — ABNORMAL HIGH (ref 65–99)

## 2015-09-04 NOTE — Progress Notes (Signed)
Physical Therapy Session Note  Patient Details  Name: Cory Weber MRN: [id number] Date of Birth: February 11, 1951  Today's Date: 09/04/2015 PT Individual Time: [phone] PT Individual Time Calculation (min): 38 min    Short Term Goals: Week 2:  PT Short Term Goal 1 (Week 2): Pt will perform basic transfers with supervision PT Short Term Goal 2 (Week 2): Pt will progress sit <> stands with min assist with RW PT Short Term Goal 3 (Week 2): Plan will be in place for ramp to be built for home access  Skilled Therapeutic Interventions/Progress Updates:    Pt received sleeping soundly in bed; does not initially wake to voice or touch, but eventually wakes and agreeable to therapy.  Requesting pain medication for 7/10 pain, RN alerted and in at end of session.  Pt transitioned supine>sit with mod I and squat/pivot to w/c with close supervision and verbal cues for hand placement.  Pt propelled w/c community level distance, through doorways, around obstacles, and up/down ramp with mod I and occasional short rest breaks.  PT instructed pt in car transfer with slide board with supervision and mod verbal cues for safe set up of transfer.  Pt propelled back to room at end of session and left upright in w/c, call bell in reach and needs met, RN entering room as PT exiting.   Therapy Documentation Precautions:  Precautions Precautions: Fall Restrictions Weight Bearing Restrictions: Yes LLE Weight Bearing: Non weight bearing   See Function Navigator for Current Functional Status.   Therapy/Group: Individual Therapy  [name] 09/04/2015, 4:42 PM

## 2015-09-04 NOTE — Progress Notes (Signed)
Occupational Therapy Session Note  Patient Details  Name: Cory Weber MRN: [id number] Date of Birth: July 23, 1950  Today's Date: 09/04/2015 OT Individual Time: 1100-1200 OT Individual Time Calculation (min): 60 min     Skilled Therapeutic Interventions/Progress Updates:   Patient participated in w/c safety (locking brakes before reaching low, forward, up high or during dynamic activities in order to stabilize chair and decrease chance of chair moving from underneath during such activities).   He completed kitchen activities such as accessing the head level cabinets and accessing refrig safely from his w/c.   He elcted not to stand during these activities due to c/o pain in surgical leg and "[name]."     As well, he demonstrated endurance and safety w/c level for making the ADL room bed and self propelling his w/c to and from his room.  Continue with primary therapist.  Therapy Documentation Precautions:  Precautions Precautions: Fall Restrictions Weight Bearing Restrictions: Yes LLE Weight Bearing: Non weight bearing Pain:6/10 in surgical leg and 4/10 left shoulder.   RN had given pain meds 30 minutes prior to session.   He stated they were addressing the pain    See Function Navigator for Current Functional Status.   Therapy/Group: Individual Therapy  [name] [facility] 09/04/2015, 12:07 PM

## 2015-09-04 NOTE — NC FL2 (Signed)
[place] LEVEL OF CARE SCREENING TOOL     IDENTIFICATION  Patient Name: Cory Weber Birthdate: September 18, 1950 Sex: male Admission Date (Current Location): 08/21/2015  [place] and [place] Number:  Kathleen Argue [id number] [place] and Address:  [facility], [place] [address], [place], [place] [address]      Provider Number: [id number]  Attending Physician Name and Address:  Meredith Staggers, MD  Relative Name and Phone Number:       Current Level of Care: Other (Comment) (Inpatient Rehabilitation Program) Recommended Level of Care: [facility] Prior Approval Number:    Date Approved/Denied:   PASRR Number: [id number]  Discharge Plan: SNF    Current Diagnoses: Patient Active Problem List   Diagnosis Date Noted  . Pyrexia   . Amputation of left lower extremity below knee ([place]) 08/21/2015  . Abnormality of gait   . Phantom limb pain ([place])   . Anemia of chronic disease   . DM type 2 with diabetic peripheral neuropathy ([place])   . HLD (hyperlipidemia)   . BPH (benign prostatic hyperplasia)   . ETOH abuse   . CKD (chronic kidney disease)   . PVD (peripheral vascular disease) ([place])   . Chronic hepatitis C without hepatic coma ([place])   . History of CVA (cerebrovascular accident) without residual deficits   . Leukocytosis   . SIRS (systemic inflammatory response syndrome) (HCC)   . Tachycardia   . Hyponatremia   . Diabetic wet gangrene of the foot ([place]) 08/15/2015  . AKI (acute kidney injury) ([place])   . Atherosclerosis of left lower extremity with ulceration of midfoot ([place])   . Left foot infection 07/23/2015  . Type 2 diabetes mellitus with vascular disease ([place]) 07/23/2015  . Diabetes mellitus with foot ulcer, without long-term current use of insulin ([place]) 07/18/2015  . Chronic kidney disease, stage 3 07/18/2015  . Essential hypertension 07/18/2015  . Dyspepsia 04/13/2014  . Acute blood loss anemia 12/01/2013  .  Constipation 12/01/2013  . Radiculopathy of lumbar region 11/29/2013  . Acute right lumbar radiculopathy 11/26/2013  . S/P lumbar spinal fusion 11/25/2013  . Lumbar spinal stenosis 11/24/2013  . Right leg weakness 11/23/2013  . PUD (peptic ulcer disease) 11/19/2012  . IDA (iron deficiency anemia) 07/10/2012  . Unspecified constipation 05/13/2012  . Normocytic anemia 12/03/2011  . Melena 12/03/2011  . DM (diabetes mellitus), type 2, uncontrolled ([place]) 02/03/2011  . Accelerated hypertension 02/03/2011  . Hemiparesthesia 02/03/2011  . Polysubstance abuse 02/03/2011  . Tobacco abuse 02/03/2011    Orientation RESPIRATION BLADDER Height & Weight     Self, Time, Situation, Place  Normal Continent Weight: 83 kg (182 lb 15.7 oz) Height:  6\' 1"  (185.4 cm)  BEHAVIORAL SYMPTOMS/MOOD NEUROLOGICAL BOWEL NUTRITION STATUS      Continent Diet (carb modified)  AMBULATORY STATUS COMMUNICATION OF NEEDS Skin   Limited Assist Verbally Surgical wounds                       Personal Care Assistance Level of Assistance  Bathing, Dressing Bathing Assistance: Limited assistance Feeding assistance: Independent Dressing Assistance: Limited assistance     Functional Limitations Info    Sight Info: Adequate Hearing Info: Adequate Speech Info: Adequate    SPECIAL CARE FACTORS FREQUENCY  OT (By licensed OT), PT (By licensed PT)     PT Frequency: 5x/wk OT Frequency: 5x/wk            Contractures Contractures Info: Not present  Additional Factors Info  Insulin Sliding Scale       Insulin Sliding Scale Info: 0-9 units tid       Current Medications (09/04/2015):  This is the current hospital active medication list Current Facility-Administered Medications  Medication Dose Route Frequency Provider Last Rate Last Dose  . acetaminophen (TYLENOL) tablet 650 mg  650 mg Oral Q6H PRN Lavon Paganini Angiulli, PA-C   650 mg at 08/29/15 1017   Or  . acetaminophen (TYLENOL) suppository 650 mg   650 mg Rectal Q6H PRN Lavon Paganini Angiulli, PA-C      . acidophilus (RISAQUAD) capsule 2 capsule  2 capsule Oral Daily Lavon Paganini Angiulli, PA-C   2 capsule at 09/04/15 0912  . aspirin EC tablet 81 mg  81 mg Oral Daily Lavon Paganini Angiulli, PA-C   81 mg at 09/04/15 0912  . carvedilol (COREG) tablet 3.125 mg  3.125 mg Oral BID WC Daniel J Angiulli, PA-C   3.125 mg at 09/04/15 0911  . enoxaparin (LOVENOX) injection 40 mg  40 mg Subcutaneous Q24H Gaynell Face Hiatt, RPH   40 mg at 09/03/15 1233  . famotidine (PEPCID) tablet 20 mg  20 mg Oral Daily Lavon Paganini Angiulli, PA-C   20 mg at 09/04/15 0910  . gabapentin (NEURONTIN) capsule 300 mg  300 mg Oral TID Meredith Staggers, MD   300 mg at 09/04/15 0910  . insulin aspart (novoLOG) injection 0-9 Units  0-9 Units Subcutaneous TID WC Lavon Paganini Angiulli, PA-C   5 Units at 09/03/15 1722  . insulin aspart (novoLOG) injection 5 Units  5 Units Subcutaneous TID WC Ankit Lorie Phenix, MD   5 Units at 09/04/15 [phone]  . insulin glargine (LANTUS) injection 17 Units  17 Units Subcutaneous Daily Charlett Blake, MD   17 Units at 09/04/15 0910  . lisinopril (PRINIVIL,ZESTRIL) tablet 5 mg  5 mg Oral BID Charlett Blake, MD   5 mg at 09/04/15 0911  . ondansetron (ZOFRAN) tablet 4 mg  4 mg Oral Q6H PRN Lavon Paganini Angiulli, PA-C       Or  . ondansetron (ZOFRAN) injection 4 mg  4 mg Intravenous Q6H PRN Lavon Paganini Angiulli, PA-C      . oxyCODONE-acetaminophen (PERCOCET/ROXICET) 5-325 MG per tablet 1-2 tablet  1-2 tablet Oral Q4H PRN Lavon Paganini Angiulli, PA-C   2 tablet at 09/04/15 0631  . polyethylene glycol (MIRALAX / GLYCOLAX) packet 17 g  17 g Oral Daily Lavon Paganini Angiulli, PA-C   17 g at 09/04/15 0912  . senna-docusate (Senokot-S) tablet 2 tablet  2 tablet Oral BID Lavon Paganini Angiulli, PA-C   2 tablet at 09/04/15 0911  . simvastatin (ZOCOR) tablet 20 mg  20 mg Oral q1800 Lavon Paganini Angiulli, PA-C   20 mg at 09/03/15 1722  . sodium phosphate (FLEET) 7-19 GM/118ML enema 1 enema  1 enema Rectal  Daily PRN Lavon Paganini Angiulli, PA-C      . sorbitol 70 % solution 30 mL  30 mL Oral Daily PRN Lavon Paganini Angiulli, PA-C   30 mL at 08/28/15 1706  . tamsulosin (FLOMAX) capsule 0.4 mg  0.4 mg Oral Daily Lavon Paganini Angiulli, PA-C   0.4 mg at 09/04/15 0910  . thiamine (VITAMIN B-1) tablet 100 mg  100 mg Oral Daily Lavon Paganini Angiulli, PA-C   100 mg at 09/04/15 [id number]     Discharge Medications: Please see discharge summary for a list of discharge medications.  Relevant Imaging Results:  Relevant Lab  Results:   Additional Information SS#: [id number]  Lennart Pall, LCSW

## 2015-09-04 NOTE — Progress Notes (Signed)
[place] PHYSICAL MEDICINE & REHABILITATION     PROGRESS NOTE  Subjective/Complaints Overall feeling well. Pain controlled. Wearing shrinker  ROS: Denies CP, SOB, N/V/D.  Objective: Vital Signs: Blood pressure 121/77, pulse 78, temperature 98.6 F (37 C), temperature source Oral, resp. rate 18, height 6\' 1"  (1.854 m), weight 83 kg (182 lb 15.7 oz), SpO2 99 %. No results found. No results for input(s): WBC, HGB, HCT, PLT in the last 72 hours.  Recent Labs  09/02/15 0617 09/04/15 0534  NA 136 134*  K 5.3* 4.7  CL 102 101  GLUCOSE 115* 117*  BUN 27* 24*  CREATININE 1.18 1.02  CALCIUM 9.7 9.5   CBG (last 3)   Recent Labs  09/03/15 1636 09/03/15 2122 09/04/15 0659  GLUCAP 293* 129* 112*    Wt Readings from Last 3 Encounters:  09/01/15 83 kg (182 lb 15.7 oz)  08/15/15 90.3 kg (199 lb 1.2 oz)  07/23/15 90.3 kg (199 lb)    Physical Exam:  BP 121/77 (BP Location: Right Arm)   Pulse 78   Temp 98.6 F (37 C) (Oral)   Resp 18   Ht 6\' 1"  (1.854 m)   Wt 83 kg (182 lb 15.7 oz)   SpO2 99%   BMI 24.14 kg/m  Constitutional: He appears well-developed and well-nourished.  HENT: Normocephalic and atraumatic.  Cardiovascular: Normal rate and regular rhythm.  Respiratory: Effort normal and breath sounds normal. No respiratory distress.  GI: Soft. Bowel sounds are normal. He exhibits no distension.  Musculoskeletal: He exhibits edema and mild tenderness. Stump well-shaped Neurological: He is alert and oriented.  Motor: B/l UE, RLE: 5/5 proximal to distal LLE: hip flexion 5/5  Skin: Skin is warm and dry.  Amputation site still with minimal serosanguinous drainage along lateral and medial sides Psychiatric: He has a normal mood and affect. His behavior is normal.   Assessment/Plan: 1. Functional deficits secondary to to left BKA 08/16/2015 after recent left popliteal angioplasty which require 3+ hours per day of interdisciplinary therapy in a comprehensive inpatient  rehab setting. Physiatrist is providing close team supervision and 24 hour management of active medical problems listed below. Physiatrist and rehab team continue to assess barriers to discharge/monitor patient progress toward functional and medical goals.  Function:  Bathing Bathing position   Position: Wheelchair/chair at sink  Bathing parts Body parts bathed by patient: Right arm, Left arm, Abdomen, Chest, Front perineal area, Buttocks, Right upper leg, Right lower leg, Left upper leg, Back Body parts bathed by helper: Back  Bathing assist Assist Level: Set up      Upper Body Dressing/Undressing Upper body dressing   What is the patient wearing?: Pull over shirt/dress     Pull over shirt/dress - Perfomed by patient: Thread/unthread right sleeve, Thread/unthread left sleeve, Put head through opening, Pull shirt over trunk          Upper body assist Assist Level: More than reasonable time   Set up : To obtain clothing/put away  Lower Body Dressing/Undressing Lower body dressing   What is the patient wearing?: Pants, Non-skid slipper socks Underwear - Performed by patient: Thread/unthread right underwear leg, Thread/unthread left underwear leg, Pull underwear up/down   Pants- Performed by patient: Thread/unthread right pants leg, Thread/unthread left pants leg, Pull pants up/down Pants- Performed by helper: Pull pants up/down Non-skid slipper socks- Performed by patient: Don/doff right sock Non-skid slipper socks- Performed by helper: Don/doff right sock  Lower body assist Assist for lower body dressing: More than reasonable time   Set up : To obtain clothing/put away  Toileting Toileting Toileting activity did not occur: N/A Toileting steps completed by patient: Adjust clothing prior to toileting, Adjust clothing after toileting, Performs perineal hygiene Toileting steps completed by helper: Adjust clothing prior to toileting Toileting Assistive  Devices: Grab bar or rail  Toileting assist Assist level: More than reasonable time   Transfers Chair/bed transfer   Chair/bed transfer method: Squat pivot Chair/bed transfer assist level: Supervision or verbal cues Chair/bed transfer assistive device: Armrests     Locomotion Ambulation     Max distance: 10' Assist level: Touching or steadying assistance (Pt > 75%)   Wheelchair   Type: Manual Max wheelchair distance: 53' in room Assist Level: No help, No cues, assistive device, takes more than reasonable amount of time  Cognition Comprehension Comprehension assist level: Follows complex conversation/direction with no assist  Expression Expression assist level: Expresses complex ideas: With no assist  Social Interaction Social Interaction assist level: Interacts appropriately with others - No medications needed.  Problem Solving Problem solving assist level: Solves complex problems: Recognizes & self-corrects  Memory Memory assist level: Complete Independence: No helper     Medical Problem List and Plan: 1. Decreased functional mobility secondary to left BKA 08/16/2015 after recent left popliteal angioplasty.  Cont CIR. Pt progressing.   2. DVT Prophylaxis/Anticoagulation: Lovenox. Monitor for any bleeding episodes. 3. Pain Management: Neurontin 300 mg--increase to TID to better cover phantom pain  -encourage massage, visual feedback  - Robaxin and oxycodone as needed 4. Acute blood loss anemia.   Hb 8.7     5. Neuropsych: This patient is capable of making decisions on his own behalf. 6. Skin/Wound Care: Routine skin checks---dry dressing to stump  -shrinker effective 7. Fluids/Electrolytes/Nutrition: Routine I&O's  8. Diabetes mellitus with peripheral neuropathy.Hemoglobin A1c 7.0   Lantus insulin 12 units daily---increased to 17u    NovoLog 3 units 3 times a day, increased to 5U on 7/13--  -overall improved. Occasional high reading.   -needs further  education   CBG (last 3)   Recent Labs  09/03/15 1636 09/03/15 2122 09/04/15 0659  GLUCAP 293* 129* 112*    9. Hypertension. Coreg 3.125 mg twice a day, Lisinopril 10 mg twice a day reduce to 5mg  given soft BPs, HCTZ 12.5 mg twice a day.   -improved Vitals:   09/03/15 2037 09/04/15 0544  BP: 113/74 121/77  Pulse: 86 78  Resp: 18 18  Temp:  98.6 F (37 C)    10. Hyperlipidemia. Zocor 11. History CVA 20/15 without residual. Continue low-dose aspirin 12. BPH. Flomax 0.4 mg daily.  13. History of tobacco/alcohol abuse. Monitor for any signs of withdrawal. Provide counseling 14. Chronic renal insufficiency. Baseline creatinine 1.34.   Cr. 1.02 today  -received kdur over weekend.   -K+ 4.7 today  -zestril decreased. I suspect this was source of hyperkalemia 15. Hyponatremia.   Na+ 134 today again  Cont to monitor 16. PVD: Cont meds 17. Hepatitis C: Avoid hepatotoxic meds. 18. SIRS: Resolved  Currently Afebrile  ID signed off  Abx d/ced on 7/13  Cont to monitor  LOS (Days) 14 A FACE TO FACE EVALUATION WAS PERFORMED  Huck Ashworth T 09/04/2015 8:46 AM

## 2015-09-04 NOTE — Progress Notes (Signed)
Physical Therapy Session Note  Patient Details  Name: Cory Weber MRN: [id number] Date of Birth: Jun 04, 1950  Today's Date: 09/04/2015 PT Individual Time: 1300-1330 PT Individual Time Calculation (min): 30 min    Short Term Goals: Week 2:  PT Short Term Goal 1 (Week 2): Pt will perform basic transfers with supervision PT Short Term Goal 2 (Week 2): Pt will progress sit <> stands with min assist with RW PT Short Term Goal 3 (Week 2): Plan will be in place for ramp to be built for home access  Skilled Therapeutic Interventions/Progress Updates:    Session focused on functional transfers (min assist needed due to poor set-up of w/c and decreased clearance of bottom), w/c parts management, and w/c mobility off unit outdoors over uneven surfaces to simulate community mobility. Pt required up to min assist on uneven surfaces to navigate and cues for efficient propulsion technique. Educated pt on getting on elevators backwards and safety with use of brakes when outdoors on uneven surfaces.   Therapy Documentation Precautions:  Precautions Precautions: Fall Restrictions Weight Bearing Restrictions: Yes LLE Weight Bearing: Non weight bearing  Pain:  No complaints.   See Function Navigator for Current Functional Status.   Therapy/Group: Individual Therapy  [name], PT, DPT  09/04/2015, 2:41 PM

## 2015-09-04 NOTE — Progress Notes (Signed)
Physical Therapy Session Note  Patient Details  Name: Cory Weber MRN: [id number] Date of Birth: 12-28-1950  Today's Date: 09/04/2015 PT Individual Time: 0800-0900 PT Individual Time Calculation (min): 60 min    Short Term Goals: Week 2:  PT Short Term Goal 1 (Week 2): Pt will perform basic transfers with supervision PT Short Term Goal 2 (Week 2): Pt will progress sit <> stands with min assist with RW PT Short Term Goal 3 (Week 2): Plan will be in place for ramp to be built for home access  Skilled Therapeutic Interventions/Progress Updates:    Session focused on functional transfer training with focus on w/c parts management and safe set-up, overall endurance and activity tolerance, dynamic standing balance with RW and 1 UE support while performing functional reaching task to lower surface (min to mod assist), and sit <> stands for blocked practice training (min to mod assist overall with verbal cues for hand placement and technique). Educated on safety in standing due to increased fall risk and recommending always to keep 1 UE support for balance at this time - pt verbalized understanding. Therex included quad sets, SLR, modified bridging with bolster under LLE, and prone hamstring curls x 10 reps each BLE (3# ankle weight on RLE). Seated core strengthening exercises using ball following PNF diagonals x 10 reps each side. Mod I w/c mobility on unit to/from therapy.   Therapy Documentation Precautions:  Precautions Precautions: Fall Restrictions Weight Bearing Restrictions: Yes LLE Weight Bearing: Non weight bearing   Pain:  No complaints.    See Function Navigator for Current Functional Status.   Therapy/Group: Individual Therapy  [name], PT, DPT  09/04/2015, 11:01 AM

## 2015-09-05 ENCOUNTER — Inpatient Hospital Stay (HOSPITAL_COMMUNITY): Payer: Medicaid Other

## 2015-09-05 ENCOUNTER — Inpatient Hospital Stay (HOSPITAL_COMMUNITY): Payer: Medicaid Other | Admitting: Physical Therapy

## 2015-09-05 ENCOUNTER — Inpatient Hospital Stay (HOSPITAL_COMMUNITY): Payer: Medicaid Other | Admitting: Occupational Therapy

## 2015-09-05 LAB — GLUCOSE, CAPILLARY
GLUCOSE-CAPILLARY: 150 mg/dL — AB (ref 65–99)
Glucose-Capillary: 142 mg/dL — ABNORMAL HIGH (ref 65–99)

## 2015-09-05 MED ORDER — OXYCODONE-ACETAMINOPHEN 5-325 MG PO TABS: 1.0000 | ORAL_TABLET | Freq: Four times a day (QID) | ORAL | 0 refills | Status: DC | PRN

## 2015-09-05 MED ORDER — INSULIN ASPART 100 UNIT/ML ~~LOC~~ SOLN: 5.0000 [IU] | Freq: Three times a day (TID) | SUBCUTANEOUS | 11 refills | Status: DC

## 2015-09-05 MED ORDER — INSULIN GLARGINE 100 UNIT/ML ~~LOC~~ SOLN: 17.0000 [IU] | Freq: Every day | SUBCUTANEOUS | 11 refills | Status: DC

## 2015-09-05 MED ORDER — GABAPENTIN 300 MG PO CAPS: 300.0000 mg | ORAL_CAPSULE | Freq: Three times a day (TID) | ORAL | Status: DC

## 2015-09-05 NOTE — Progress Notes (Signed)
Physical Therapy Discharge Summary  Patient Details  Name: Cory Weber MRN: [id number] Date of Birth: 24-May-1950   Patient has met 8 of 8 long term goals due to improved activity tolerance, improved balance, increased strength, decreased pain and ability to compensate for deficits.  Patient to discharge at a wheelchair level Supervision. Due to patient living alone without family support available and not having an accessible home entry and acces, pt to d/c to SNF.   Reasons goals not met: n/a - all goals met at this time  Recommendation:  Patient will benefit from ongoing skilled PT services in skilled nursing facility setting to continue to advance safe functional mobility, address ongoing impairments in strength, balance, endurance, transfers, gait, and minimize fall risk.  Equipment: No equipment provided. TBD at next venue of care.  Reasons for discharge: treatment goals met and discharge from hospital  Patient/family agrees with progress made and goals achieved: Yes  PT Discharge Precautions/Restrictions Precautions Precautions: Fall Restrictions LLE Weight Bearing: Non weight bearing Sensation Sensation Light Touch: Appears Intact Additional Comments: phantom pain and sensation in LLE Coordination Gross Motor Movements are Fluid and Coordinated: Yes Motor  Motor Motor: Other (comment) (generalized weakness; new L BKA)    Balance Balance Balance Assessed: Yes Static Sitting Balance Static Sitting - Level of Assistance: 6: Modified independent (Device/Increase time) Dynamic Sitting Balance Dynamic Sitting - Level of Assistance: 6: Modified independent (Device/Increase time);5: Stand by assistance Static Standing Balance Static Standing - Level of Assistance: 5: Stand by assistance;4: Min assist Dynamic Standing Balance Dynamic Standing - Level of Assistance: 4: Min assist Extremity Assessment   see OT summary for details.   RLE Assessment RLE Assessment:   (grossly 4/5) LLE Assessment LLE Assessment:  (BKA- decreased extension) LLE Strength LLE Overall Strength Comments: grossly 3+/5 to 4/5   See Function Navigator for Current Functional Status.  [name], PT, DPT  09/05/2015, 2:46 PM

## 2015-09-05 NOTE — Progress Notes (Signed)
Occupational Therapy Session Note  Patient Details  Name: Cory Weber MRN: [id number] Date of Birth: 07-04-1950  Today's Date: 09/05/2015 OT Individual Time: 0930-1100 OT Individual Time Calculation (min): 90 min     Short Term Goals:Week 2:  OT Short Term Goal 1 (Week 2): STG=LTG due to LOS  Skilled Therapeutic Interventions/Progress Updates:    Pt seen for OT session focusing on ADL re-training, UE/LE strengthening, and functional transfers. Pt asleep in supine upon arrival, easily awoken and agreeable to tx session.  He declined showering task, opting to bathe from w/c level at sink, completed with set-up assist. VCs provided throughout for safety awareness to lock w/c brakes And not reach outside BOS due to risk of fall.  Toilet transfer completed to bariatric [facility] using sliding board at supervision- mod I level with VCs provided for better set-up of slide board in prep for transfer.  In therapy gym, completed squat pivot transfer with supervision. Completed LE strengthening/ ROM exercises in supine and prone position with VCs for proper form and technique. Completed UE strengthening exercises using #2 dowel rod, completed overhead press, chest press, and bicep curls x2 sets of 10 reps.  Laundry task completed at supervision level with VCs for safety awareness and problem solving positioning of w/c in small laundry room. Pt returned to room at end of session, left with all needs in reach.   Cont to educate regarding pt's need for supervision during functional transfers and fall risk.   Therapy Documentation Precautions:  Precautions Precautions: Fall Restrictions Weight Bearing Restrictions: Yes LLE Weight Bearing: Non weight bearing Pain: Pain Assessment Pain Assessment: 0-10 Pain Score: 5  Pain Type: Acute pain Pain Location: Leg Pain Orientation: Left Patients Stated Pain Goal: 4 Pain Intervention(s): Reported being pre-medicated prior to tx session; increased activity;  repositioned  See Function Navigator for Current Functional Status.   Therapy/Group: Individual Therapy  Lewis, Danayah Smyre C 09/05/2015, 7:13 AM

## 2015-09-05 NOTE — Progress Notes (Signed)
Social Work Patient ID: Cory Weber, male   DOB: [birth year], 65 y.o.   MRN: [id number]  [facility] SN[facility] bed [facility] and [employer].  Reviewed with pt and he has accepted bed at [facility].  Daughter, Deborra Medina, also aware that pt has accepted bed and will transfer to facility this afternoon.  [place], LCSW

## 2015-09-05 NOTE — Progress Notes (Signed)
PHYSICAL MEDICINE & REHABILITATION     PROGRESS NOTE  Subjective/Complaints No new complaints.   ROS: Denies CP, SOB, N/V/D.  Objective: Vital Signs: Blood pressure 109/75, pulse 80, temperature 98.4 F (36.9 C), temperature source Oral, resp. rate 18, height 6\' 1"  (1.854 m), weight 83 kg (182 lb 15.7 oz), SpO2 96 %. No results found. No results for input(s): WBC, HGB, HCT, PLT in the last 72 hours.  Recent Labs  09/04/15 0534  NA 134*  K 4.7  CL 101  GLUCOSE 117*  BUN 24*  CREATININE 1.02  CALCIUM 9.5   CBG (last 3)   Recent Labs  09/04/15 1621 09/04/15 2036 09/05/15 0652  GLUCAP 106* 154* 142*    Wt Readings from Last 3 Encounters:  09/01/15 83 kg (182 lb 15.7 oz)  08/15/15 90.3 kg (199 lb 1.2 oz)  07/23/15 90.3 kg (199 lb)    Physical Exam:  BP 109/75 (BP Location: Right Arm)   Pulse 80   Temp 98.4 F (36.9 C) (Oral)   Resp 18   Ht 6\' 1"  (1.854 m)   Wt 83 kg (182 lb 15.7 oz)   SpO2 96%   BMI 24.14 kg/m  Constitutional: He appears well-developed and well-nourished.  HENT: Normocephalic and atraumatic.  Cardiovascular: Normal rate and regular rhythm.  Respiratory: Effort normal and breath sounds normal. No respiratory distress.  GI: Soft. Bowel sounds are normal. He exhibits no distension.  Musculoskeletal: He exhibits edema and mild tenderness. Stump well-shaped Neurological: He is alert and oriented.  Motor: B/l UE, RLE: 5/5 proximal to distal LLE: hip flexion 5/5  Skin: Skin is warm and dry.  Amputation site still with minimal serosanguinous drainage along lateral and medial sides Psychiatric: He has a normal mood and affect. His behavior is normal.   Assessment/Plan: 1. Functional deficits secondary to to left BKA 08/16/2015 after recent left popliteal angioplasty which require 3+ hours per day of interdisciplinary therapy in a comprehensive inpatient rehab setting. Physiatrist is providing close team supervision and 24 hour  management of active medical problems listed below. Physiatrist and rehab team continue to assess barriers to discharge/monitor patient progress toward functional and medical goals.  Function:  Bathing Bathing position   Position: Wheelchair/chair at sink  Bathing parts Body parts bathed by patient: Right arm, Left arm, Abdomen, Chest, Front perineal area, Buttocks, Right upper leg, Right lower leg, Left upper leg, Back Body parts bathed by helper: Back  Bathing assist Assist Level: Set up      Upper Body Dressing/Undressing Upper body dressing   What is the patient wearing?: Pull over shirt/dress     Pull over shirt/dress - Perfomed by patient: Thread/unthread right sleeve, Thread/unthread left sleeve, Put head through opening, Pull shirt over trunk          Upper body assist Assist Level: More than reasonable time   Set up : To obtain clothing/put away  Lower Body Dressing/Undressing Lower body dressing   What is the patient wearing?: Pants, Non-skid slipper socks Underwear - Performed by patient: Thread/unthread right underwear leg, Thread/unthread left underwear leg, Pull underwear up/down   Pants- Performed by patient: Thread/unthread right pants leg, Thread/unthread left pants leg, Pull pants up/down Pants- Performed by helper: Pull pants up/down Non-skid slipper socks- Performed by patient: Don/doff right sock Non-skid slipper socks- Performed by helper: Don/doff right sock                  Lower body assist Assist for lower  body dressing: More than reasonable time   Set up : To obtain clothing/put away  Toileting Toileting Toileting activity did not occur: N/A Toileting steps completed by patient: Adjust clothing prior to toileting, Adjust clothing after toileting, Performs perineal hygiene Toileting steps completed by helper: Adjust clothing prior to toileting Toileting Assistive Devices: Grab bar or rail  Toileting assist Assist level: More than reasonable  time   Transfers Chair/bed transfer   Chair/bed transfer method: Lateral scoot, Squat pivot Chair/bed transfer assist level: Supervision or verbal cues Chair/bed transfer assistive device: Armrests, Sliding board     Locomotion Ambulation     Max distance: 10' Assist level: Touching or steadying assistance (Pt > 75%)   Wheelchair   Type: Manual Max wheelchair distance: 500 Assist Level: No help, No cues, assistive device, takes more than reasonable amount of time  Cognition Comprehension Comprehension assist level: Follows complex conversation/direction with no assist  Expression Expression assist level: Expresses complex ideas: With no assist  Social Interaction Social Interaction assist level: Interacts appropriately with others - No medications needed.  Problem Solving Problem solving assist level: Solves complex problems: Recognizes & self-corrects  Memory Memory assist level: Complete Independence: No helper     Medical Problem List and Plan: 1. Decreased functional mobility secondary to left BKA 08/16/2015 after recent left popliteal angioplasty.  Cont CIR. Nos SNF pending   2. DVT Prophylaxis/Anticoagulation: Lovenox. Monitor for any bleeding episodes. 3. Pain Management: Neurontin 300 mg--increase to TID to better cover phantom pain  -encourage massage, visual feedback  - Robaxin and oxycodone as needed 4. Acute blood loss anemia.   Hb 8.7     5. Neuropsych: This patient is capable of making decisions on his own behalf. 6. Skin/Wound Care: Routine skin checks---dry dressing to stump  -shrinker effective 7. Fluids/Electrolytes/Nutrition: Routine I&O's  8. Diabetes mellitus with peripheral neuropathy.Hemoglobin A1c 7.0   Lantus insulin 12 units daily---increased to 17u    NovoLog 3 units 3 times a day, increased to 5U on 7/13--  -overall improved. Occasional high reading.   -needs further education   CBG (last 3)   Recent Labs  09/04/15 1621 09/04/15 2036  09/05/15 0652  GLUCAP 106* 154* 142*    9. Hypertension. Coreg 3.125 mg twice a day, Lisinopril 10 mg twice a day reduce to 5mg  given soft BPs, HCTZ 12.5 mg twice a day.   -improved Vitals:   09/04/15 1407 09/05/15 0550  BP: 132/67 109/75  Pulse: 88 80  Resp: 18 18  Temp: 98.7 F (37.1 C) 98.4 F (36.9 C)    10. Hyperlipidemia. Zocor 11. History CVA 20/15 without residual. Continue low-dose aspirin 12. BPH. Flomax 0.4 mg daily.  13. History of tobacco/alcohol abuse. Monitor for any signs of withdrawal. Provide counseling 14. Chronic renal insufficiency. Baseline creatinine 1.34.   Cr. 1.02 today  -received kdur over weekend.   -K+ 4.7 today  -zestril decreased. I suspect this was source of hyperkalemia 15. Hyponatremia.   Na+ 134 today again  Cont to monitor 16. PVD: Cont meds 17. Hepatitis C: Avoid hepatotoxic meds. 18. SIRS: Resolved  Currently Afebrile  ID signed off  Abx d/ced on 7/13  Cont to monitor  LOS (Days) 15 A FACE TO FACE EVALUATION WAS PERFORMED  Eladia Frame T 09/05/2015 8:52 AM

## 2015-09-05 NOTE — Progress Notes (Deleted)
Physical Therapy Session Note  Patient Details  Name: Cory Weber MRN: [id number] Date of Birth: 1950-09-10  Today's Date: 09/05/2015 PT Individual Time: 0700-0800 PT Individual Time Calculation (min): 60 min    Short Term Goals: Week 2:  PT Short Term Goal 1 (Week 2): Pt will perform basic transfers with supervision PT Short Term Goal 2 (Week 2): Pt will progress sit <> stands with min assist with RW PT Short Term Goal 3 (Week 2): Plan will be in place for ramp to be built for home access  Skilled Therapeutic Interventions/Progress Updates:     Therapy Documentation Precautions:  Precautions Precautions: Fall Restrictions Weight Bearing Restrictions: Yes LLE Weight Bearing: Non weight bearing General:   Vital Signs:   Pain:   Mobility:   Locomotion :    Trunk/Postural Assessment :    Balance:   Exercises:   Other Treatments:     See Function Navigator for Current Functional Status.   Therapy/Group: Individual Therapy  Waunita Schooner 09/05/2015, 12:35 PM

## 2015-09-05 NOTE — Progress Notes (Signed)
Physical Therapy Session Note  Patient Details  Name: Cory Weber MRN: [id number] Date of Birth: [birth year]  Today's Date: 09/05/2015 PT Individual Time: 0700-0800 PT Individual Time Calculation (min): 60 min    Short Term Goals: Week 2:  PT Short Term Goal 1 (Week 2): Pt will perform basic transfers with supervision PT Short Term Goal 2 (Week 2): Pt will progress sit <> stands with min assist with RW PT Short Term Goal 3 (Week 2): Plan will be in place for ramp to be built for home access  Skilled Therapeutic Interventions/Progress Updates:    Session focused on functional transfers including in/out of bed (including ADL apartment bed to simulate home environment) and furniture transfers, w/c mobility training and parts management, sit <> stands with RW with min assist and verbal cues for hand placement, and short distance gait with RW (10' x 2 with cues for technique and gait pattern). Pt continues to reqiure verbal cues for safe set-up of transfers. Performed mod I w/c mobility on unit.   Therapy Documentation Precautions:  Precautions Precautions: Fall Restrictions Weight Bearing Restrictions: Yes LLE Weight Bearing: Non weight bearing   Pain: No complaints.   See Function Navigator for Current Functional Status.   Therapy/Group: Individual Therapy  [name], PT, DPT  09/05/2015, 12:12 PM

## 2015-09-05 NOTE — Discharge Summary (Signed)
Physician Discharge Summary  Patient ID: Cory Weber MRN: [id number] DOB/AGE: 63-Jun-1952 63 y.o.  Admit date: 08/21/2015 Discharge date: 09/05/2015  Discharge Diagnoses:  Principal Problem:   Amputation of left lower extremity below knee [facility]) Active Problems:   Normocytic anemia   Essential hypertension   Left foot infection   Type 2 diabetes mellitus with vascular disease ([place])   Chronic hepatitis C without hepatic coma (HCC)   HLD (hyperlipidemia)   CKD (chronic kidney disease)   PVD (peripheral vascular disease) ([place])   Discharged Condition: Stable   Significant Diagnostic Studies: Dg Chest 2 View  Result Date: 09/05/2015 CLINICAL DATA:  Patient to transfer today out of Rehabilatation. EXAM: CHEST  2 VIEW COMPARISON:  04/01/2014 FINDINGS: Lungs are adequately inflated without consolidation or effusion. Cardiomediastinal silhouette is within normal mild prominence of the main pulmonary arteries. Minimal degenerate change of the spine. IMPRESSION: No acute cardiopulmonary disease. Electronically Signed   By: Marin Olp M.D.   On: 09/05/2015 12:29   Labs:  Basic Metabolic Panel:  Recent Labs Lab 09/01/15 0519 09/02/15 0617 09/04/15 0534  NA 134* 136 134*  K 5.5* 5.3* 4.7  CL 101 102 101  CO2 28 28 28   GLUCOSE 128* 115* 117*  BUN 33* 27* 24*  CREATININE 1.26* 1.18 1.02  CALCIUM 9.7 9.7 9.5    CBC:  Recent Labs Lab 08/30/15 0641 09/01/15 0519  WBC 7.3 8.1  HGB 8.8* 8.7*  HCT 27.6* 27.6*  MCV 86.5 87.9  PLT 494* 456*    CBG:  Recent Labs Lab 09/04/15 1156 09/04/15 1621 09/04/15 2036 09/05/15 0652 09/05/15 1134  GLUCAP 75 106* 154* 142* 150*    Brief HPI:    Cory Weber is a 65 y.o. Male with history of HTN, T2DM, tobacco abuse, hepatitis C, CKD PVD with recent foot surgery and was admitted on 08/15/15 with cellulitis and maggots in the wound. LLE not felt to be salvageable and he underwent L-BKA on 08/16/15 by Dr. Trula Slade.  He was treated  with IV antibiotics for SIRS and was this was doxycyline due to ongoing low grade fevers and leucocytosis. ABLA anemia being monitored and pain control was improving. CIR recommended by rehab team for follow up therapy.    Hospital Course: Cory Weber was admitted to rehab 08/21/2015 for inpatient therapies to consist of PT and OT at least three hours five days a week. Past admission physiatrist, therapy team and rehab RN have worked together to provide customized collaborative inpatient rehab.  Blood pressures were monitored on bid basis and lisinopril was decreased due to low BPs.  Diabetes has been monitored on ac/hs basis with meal coverage and SSI used for elevated BS. Lantus was titrated upwards to 17 units with improvement in BS.  ABLA has been monitored with serial checks and has been stable. Dr. Megan Salon was consulted for input on low grade fevers and leucocytosis. As wound was noted to be healing well and fevers had resolved, he recommended stopping doxycyline on 7/13.  He has been afebrile off antibiotics and reactive leucocytosis has resolved. Renal status has been monitored with serial checks and has improved with decrease in lisinopril. He does need encouragement to increase fluid intake to help with renal status.  Doxycycline was discontinued on 07/13 and he continues to be afebrile.  He has been educated no desensitization techniques and Neurontin has been titrated upwards to help with neuropathic symptoms. Oxycodone has been used on prn basis for severe pain.  Dr.  Early has followed up for input on wound due to dehiscence. Steri-strips were applied with compression and wound remains intact. Drainage has resolved and no signs of infection noted. He has been making steady progress but continues to require assistance and cues for safety with mobility. Family is unable to provide assistance needed and has elected on progressive therapy at SNF. He was discharged to [facility] SN[facility] on 09/05/15.     Rehab course: During patient's stay in rehab weekly team conferences were held to monitor patient's progress, set goals and discuss barriers to discharge.  At admission, patient required max assist with mobility and self care tasks. He has had improvement in activity tolerance, balance, postural control, as well as ability to compensate for deficits.  He requires set up assist for bathing and dressing with extra time. He requires min assist for transfers and cues for hand placement. He is able to ambulate 10' X 2 with RW and min assist. He is independent for wheelchair mobility     Disposition:  [place].   Diet: Diabetic diet.   Special Instructions: 1. Offer fluids frequently. Check BMET in 3-5 days. 2. Monitor BS ac/hs and use SSI per protocol. 3.  Cleanse wound with normal saline. Pat dry and apply kerlix. Cover with stump shrinker.       Medication List    STOP taking these medications   acetaminophen 325 MG tablet Commonly known as:  TYLENOL   doxycycline 100 MG tablet Commonly known as:  VIBRA-TABS   feeding supplement (GLUCERNA SHAKE) Liqd   feeding supplement (PRO-STAT SUGAR FREE 64) Liqd   glipiZIDE 5 MG tablet Commonly known as:  GLUCOTROL   lisinopril-hydrochlorothiazide 20-12.5 MG tablet Commonly known as:  PRINZIDE,ZESTORETIC     TAKE these medications   acidophilus Caps capsule Take 2 capsules by mouth daily.   aspirin EC 81 MG tablet Take 1 tablet (81 mg total) by mouth daily.   carvedilol 3.125 MG tablet Commonly known as:  COREG Take 1 tablet (3.125 mg total) by mouth 2 (two) times daily with a meal.   famotidine 20 MG tablet Commonly known as:  PEPCID Take 1 tablet (20 mg total) by mouth daily.   folic acid 1 MG tablet Commonly known as:  FOLVITE Take 1 tablet (1 mg total) by mouth daily.   gabapentin 300 MG capsule Commonly known as:  NEURONTIN Take 1 capsule (300 mg total) by mouth 3 (three) times daily. What changed:   when to take this   insulin aspart 100 UNIT/ML injection Commonly known as:  novoLOG Inject 5 Units into the skin 3 (three) times daily with meals.   insulin glargine 100 UNIT/ML injection Commonly known as:  LANTUS Inject 0.17 mLs (17 Units total) into the skin daily.   multivitamin with minerals Tabs tablet Take 1 tablet by mouth daily.   oxyCODONE-acetaminophen 5-325 MG tablet Commonly known as:  PERCOCET/ROXICET Take 1-2 tablets by mouth every 6 (six) hours as needed for severe pain.   polyethylene glycol packet Commonly known as:  MIRALAX / GLYCOLAX Take 17 g by mouth daily.   senna-docusate 8.6-50 MG tablet Commonly known as:  Senokot-S Take 2 tablets by mouth 2 (two) times daily.   simvastatin 20 MG tablet Commonly known as:  ZOCOR TAKE ONE TABLET BY MOUTH DAILY AT 6 PM.   tamsulosin 0.4 MG Caps capsule Commonly known as:  FLOMAX Take 1 capsule (0.4 mg total) by mouth daily.   thiamine 100 MG tablet Take 1  tablet (100 mg total) by mouth daily.       Contact information for follow-up providers    Meredith Staggers, MD.   Specialty:  Physical Medicine and Rehabilitation Why:  Office to call for appointment Contact information: [address] [place] [address] [phone]        Annamarie Major, MD Follow up on 09/18/2015.   Specialties:  Vascular Surgery, Cardiology Why:  appointment at 3:45 pm Contact information: [address] [place] [address] [phone]            Contact information for after-discharge care    [facility] .   Specialty:  [place] information: [address] [place] [phone]                  Signed: Bary Leriche 09/05/2015, 12:51 PM

## 2015-09-05 NOTE — H&P (Signed)
Pt discharging today to SNF. Report called to receiving RN at [facility]. Pt awaiting transportation.

## 2015-09-05 NOTE — Progress Notes (Signed)
Occupational Therapy Discharge Summary  Patient Details  Name: Cory Weber MRN: [id number] Date of Birth: 05-25-1950   Patient has met 10 of 10 long term goals due to improved activity tolerance, improved balance, postural control and ability to compensate for deficits.  Patient to discharge at [facility] Assist level.  Patient discharging to SNF in order to continue to increase independence with ADLs/IADLs    Pt currently requires supervision- min A for functional mobility. He requires increased cues for safety awareness as pt with decreased safety awareness and decreased awareness of deficits.  Currently completing sliding board transfers to bariatric drop arm BSC for toileting needs. All other functional transfers via squat pivot with supervision- min A, however, can require up to mod A depending on fatigue level.   Recommendation:  Patient will benefit from ongoing skilled OT services in skilled nursing facility setting to continue to advance functional skills in the area of BADL, iADL and Reduce care partner burden.  Equipment: No equipment provided. To be determined at next venue of care  Reasons for discharge: treatment goals met and discharge from hospital  Patient/family agrees with progress made and goals achieved: Yes  OT Discharge Precautions/Restrictions  Precautions Precautions: Fall Restrictions Weight Bearing Restrictions: Yes LLE Weight Bearing: Non weight bearing Vision/Perception  Vision- History Baseline Vision/History: Wears glasses Wears Glasses: Reading only Patient Visual Report: No change from baseline Vision- Assessment Vision Assessment?: No apparent visual deficits  Cognition Overall Cognitive Status: Within Functional Limits for tasks assessed Orientation Level: Oriented X4 Memory: Appears intact Awareness: Appears intact Problem Solving: Appears intact Safety/Judgment: Impaired Comments: Impulsive with decreased awareness of  deficits Sensation Sensation Light Touch: Appears Intact Proprioception: Appears Intact Additional Comments: phantom pain and sensation in LLE Coordination Gross Motor Movements are Fluid and Coordinated: Yes Fine Motor Movements are Fluid and Coordinated: Yes Motor  Motor Motor: Within Functional Limits Motor - Discharge Observations: Generalized weakness, however, much improved since admission Trunk/Postural Assessment  Cervical Assessment Cervical Assessment: Within Functional Limits Thoracic Assessment Thoracic Assessment: Within Functional Limits Lumbar Assessment Lumbar Assessment: Within Functional Limits Postural Control Postural Control: Within Functional Limits  Balance Balance Balance Assessed: Yes Static Sitting Balance Static Sitting - Level of Assistance: 6: Modified independent (Device/Increase time) Dynamic Sitting Balance Dynamic Sitting - Level of Assistance: 6: Modified independent (Device/Increase time) Static Standing Balance Static Standing - Level of Assistance: 5: Stand by assistance;4: Min assist Dynamic Standing Balance Dynamic Standing - Level of Assistance: 4: Min assist Extremity/Trunk Assessment RUE Assessment RUE Assessment: Within Functional Limits LUE Assessment LUE Assessment: Within Functional Limits   See Function Navigator for Current Functional Status.  Bobby Rumpf, Krishay Faro C 09/05/2015, 3:31 PM

## 2015-09-06 ENCOUNTER — Encounter: Payer: Self-pay | Admitting: Adult Health

## 2015-09-06 ENCOUNTER — Non-Acute Institutional Stay (SKILLED_NURSING_FACILITY): Payer: Medicaid Other | Admitting: Adult Health

## 2015-09-06 DIAGNOSIS — K279 Peptic ulcer, site unspecified, unspecified as acute or chronic, without hemorrhage or perforation: Secondary | ICD-10-CM

## 2015-09-06 DIAGNOSIS — Z89512 Acquired absence of left leg below knee: Secondary | ICD-10-CM | POA: Diagnosis not present

## 2015-09-06 DIAGNOSIS — I70244 Atherosclerosis of native arteries of left leg with ulceration of heel and midfoot: Secondary | ICD-10-CM

## 2015-09-06 DIAGNOSIS — I10 Essential (primary) hypertension: Secondary | ICD-10-CM

## 2015-09-06 DIAGNOSIS — F1010 Alcohol abuse, uncomplicated: Secondary | ICD-10-CM

## 2015-09-06 DIAGNOSIS — N400 Enlarged prostate without lower urinary tract symptoms: Secondary | ICD-10-CM | POA: Diagnosis not present

## 2015-09-06 DIAGNOSIS — N183 Chronic kidney disease, stage 3 unspecified: Secondary | ICD-10-CM

## 2015-09-06 DIAGNOSIS — E1159 Type 2 diabetes mellitus with other circulatory complications: Secondary | ICD-10-CM | POA: Diagnosis not present

## 2015-09-06 DIAGNOSIS — B182 Chronic viral hepatitis C: Secondary | ICD-10-CM | POA: Diagnosis not present

## 2015-09-06 DIAGNOSIS — S88112A Complete traumatic amputation at level between knee and ankle, left lower leg, initial encounter: Secondary | ICD-10-CM

## 2015-09-06 DIAGNOSIS — K5901 Slow transit constipation: Secondary | ICD-10-CM | POA: Diagnosis not present

## 2015-09-06 NOTE — Progress Notes (Signed)
Patient ID: Cory Weber, male   DOB: 04-28-1950, 65 y.o.   MRN: [id number]   Location:   [place] Room Number: 123-B Place of Service:  SNF (31)   CODE STATUS: Full Code until further determined  No Known Allergies  Chief Complaint  Patient presents with  . Hospitalization Follow-up    Hospital follow up    HPI:  He has been hospitalized for a left bka. He was acute rehab for his initial rehab. He is here to complete his rehab with his goal to return back home. He is not voicing any complaints or concerns at this time. There are no nursing concerns at this time.  His goal is to return back home.    Past Medical History:  Diagnosis Date  . Anemia   . Asthma   . Cellulitis and abscess of foot 07/2015  . CVA (cerebral vascular accident) ([place]) 2015   denies residual on 08/15/2015  . Hepatitis C    states he was diagnosed in 2007 or 2007 while living in [place], [place]  . Hyperlipidemia   . Hypertension   . Peripheral neuropathy ([place])   . Pneumonia X 1  . PVD (peripheral vascular disease) ([place])   . Type II diabetes mellitus ([place])     Past Surgical History:  Procedure Laterality Date  . AMPUTATION Left 08/16/2015   Procedure: LEFT BELOW THE KNEE AMPUTATION ;  Surgeon: Serafina Mitchell, MD;  Location: [place];  Service: Vascular;  Laterality: Left;  . BACK SURGERY    . BIOPSY N/A 09/03/2012   Procedure: BIOPSY;  Surgeon: Daneil Dolin, MD;  Location: [facility];  Service: Endoscopy;  Laterality: N/A;  gastric and gastric mucosa  . BIOPSY N/A 12/03/2012   Procedure: BIOPSY;  Surgeon: Daneil Dolin, MD;  Location: [facility];  Service: Endoscopy;  Laterality: N/A;  . COLONOSCOPY WITH PROPOFOL N/A 09/03/2012   [id number] polyp-removed as outlined above. Prominent internal hemorrhoids. Tubular adenoma  . ESOPHAGOGASTRODUODENOSCOPY (EGD) WITH PROPOFOL N/A 09/03/2012   [id number] hernia. Gastric diverticulum. Gastric ulcers with associated erosions. Duodenal erosions. Status  post gastric biopsy. H.PYLORI gastritis   . ESOPHAGOGASTRODUODENOSCOPY (EGD) WITH PROPOFOL N/A 12/03/2012   Dr. Gala Romney: gastric diverticulum, gastric erosions and scar. Previously noted gastric ulcer completed healed. Biopsy without H.pylori.   Marland Kitchen LIVER BIOPSY  2005   Done in [place], [place]. Chronic hepatitis with mild periportal inflammation, lobular unicellular necrosis and portal fibrosis. Grade 2, stage 1-2.  Marland Kitchen MAXIMUM ACCESS (MAS)POSTERIOR LUMBAR INTERBODY FUSION (PLIF) 1 LEVEL N/A 11/25/2013   Procedure: FOR MAXIMUM ACCESS (MAS) POSTERIOR LUMBAR INTERBODY FUSION (PLIF) 1 LEVEL;  Surgeon: Eustace Moore, MD;  Location: [place];  Service: Neurosurgery;  Laterality: N/A;  FOR MAXIMUM ACCESS (MAS) POSTERIOR LUMBAR INTERBODY FUSION (PLIF) 1 LEVEL LUMBAR 3-4  . PERIPHERAL VASCULAR CATHETERIZATION Left 08/01/2015   Procedure: Lower Extremity Angiography;  Surgeon: Serafina Mitchell, MD;  Location: [place] CV LAB;  Service: Cardiovascular;  Laterality: Left;  . PERIPHERAL VASCULAR CATHETERIZATION N/A 08/01/2015   Procedure: Abdominal Aortogram;  Surgeon: Serafina Mitchell, MD;  Location: [place] CV LAB;  Service: Cardiovascular;  Laterality: N/A;  . PERIPHERAL VASCULAR CATHETERIZATION N/A 08/08/2015   Procedure: Abdominal Aortogram w/Lower Extremity;  Surgeon: Serafina Mitchell, MD;  Location: [place] CV LAB;  Service: Cardiovascular;  Laterality: N/A;  . PERIPHERAL VASCULAR CATHETERIZATION Left 08/08/2015   Procedure: Peripheral Vascular Balloon Angioplasty;  Surgeon: Serafina Mitchell, MD;  Location: [place] CV  LAB;  Service: Cardiovascular;  Laterality: Left;  left popiteal artery, left peronealtrunk, left post tibial  . POLYPECTOMY N/A 09/03/2012   Procedure: POLYPECTOMY;  Surgeon: Daneil Dolin, MD;  Location: [facility];  Service: Endoscopy;  Laterality: N/A;  cecal polyp  . TONSILLECTOMY      Social History   Social History  . Marital status: Divorced    Spouse name: N/A  . Number of  children: 1  . Years of education: N/A   Occupational History  . trying to get disability    Social History Main Topics  . Smoking status: Former Smoker    Packs/day: 1.00    Years: 47.00    Types: Cigarettes    Quit date: 07/13/2015  . Smokeless tobacco: Never Used  . Alcohol use 0.0 oz/week     Comment: 08/15/2015 "stopped drinking 3-4 years ago"  . Drug use:     Types: Cocaine     Comment: 08/15/2015 "last used cocaine 3-4 months ago"  . Sexual activity: No   Other Topics Concern  . Not on file   Social History Narrative  . No narrative on file   Family History  Problem Relation Age of Onset  . Breast cancer Mother     deceased  . Cancer Mother   . Heart failure Mother   . Diabetes Father   . Hypertension Father   . Heart disease Father     deceased  . Hyperlipidemia Father   . Diabetes Sister   . Hypertension Sister   . Breast cancer Sister   . Colon cancer Neg Hx   . Liver disease Neg Hx       VITAL SIGNS BP 109/75   Pulse 80   Temp 98.4 F (36.9 C) (Oral)   Resp 18   SpO2 96%   Patient's Medications  New Prescriptions   No medications on file  Previous Medications   ACIDOPHILUS (RISAQUAD) CAPS CAPSULE    Take 2 capsules by mouth daily.   ASPIRIN EC 81 MG TABLET    Take 1 tablet (81 mg total) by mouth daily.   CARVEDILOL (COREG) 3.125 MG TABLET    Take 1 tablet (3.125 mg total) by mouth 2 (two) times daily with a meal.   FAMOTIDINE (PEPCID) 20 MG TABLET    Take 1 tablet (20 mg total) by mouth daily.   FOLIC ACID (FOLVITE) 1 MG TABLET    Take 1 tablet (1 mg total) by mouth daily.   GABAPENTIN (NEURONTIN) 300 MG CAPSULE    Take 1 capsule (300 mg total) by mouth 3 (three) times daily.   INSULIN ASPART (NOVOLOG) 100 UNIT/ML INJECTION    Inject 5 Units into the skin 3 (three) times daily with meals.   INSULIN GLARGINE (LANTUS) 100 UNIT/ML INJECTION    Inject 0.17 mLs (17 Units total) into the skin daily.   MULTIPLE VITAMIN (MULTIVITAMIN WITH MINERALS) TABS  TABLET    Take 1 tablet by mouth daily.   OXYCODONE-ACETAMINOPHEN (PERCOCET/ROXICET) 5-325 MG TABLET    Take 1-2 tablets by mouth every 6 (six) hours as needed for severe pain.   POLYETHYLENE GLYCOL (MIRALAX / GLYCOLAX) PACKET    Take 17 g by mouth daily.   SENNA-DOCUSATE (SENOKOT-S) 8.6-50 MG TABLET    Take 2 tablets by mouth 2 (two) times daily.   TAMSULOSIN (FLOMAX) 0.4 MG CAPS CAPSULE    Take 1 capsule (0.4 mg total) by mouth daily.   THIAMINE 100 MG TABLET  Take 1 tablet (100 mg total) by mouth daily.  Modified Medications   No medications on file  Discontinued Medications   SIMVASTATIN (ZOCOR) 20 MG TABLET    TAKE ONE TABLET BY MOUTH DAILY AT 6 PM.     SIGNIFICANT DIAGNOSTIC EXAMS  08-03-15: TEE: - Left ventricle: Septal and apical hypokinesis. The cavity size was mildly dilated. Wall thickness was increased in a pattern of moderate LVH. Systolic function was mildly reduced. The estimated ejection fraction was in the range of 45% to 50%. Wall motion was normal; there were no regional wall motion abnormalities. Left ventricular diastolic function parameters were normal. - Aortic valve: Severely calcified non coronary cusp. - Mitral valve: Calcified annulus. Mildly thickened leaflets . - Atrial septum: No defect or patent foramen ovale was identified.  09-05-15: chest x-ray: No acute cardiopulmonary disease.    LABS REVIEWED:   08-31-15: wbc 10.7; hgb 8.7; hct 27.5; mcv 86.8; plt 453; glucose 163; bun 32; creat 1.42; k+4.1; na++132; liver normal albumin 2.6 08-26-15: wbc 9.1; hgb 8.9; hct 28.5; mcv 86.6; plt 572; glucose 140; bun 26; creat 1.18; k+ 4.4; na++132 09-01-15: wbc 8.0; hgb 8.7; hct 27.6;mcv 87.9; plt 456; glucose 128; bun 33; creat 1.26; k+ 5.5; na++ 134 09-04-15: glucose 117; bun 24; creat 1.02; k+ 4.7; na++ 134   Review of Systems  Constitutional: Negative for malaise/fatigue.  Respiratory: Negative for cough and shortness of breath.   Cardiovascular: Negative for chest  pain, palpitations and leg swelling.  Gastrointestinal: Negative for abdominal pain, constipation and heartburn.  Musculoskeletal: Negative for back pain, joint pain and myalgias.  Skin: Negative.   Neurological: Negative for dizziness.  Psychiatric/Behavioral: The patient is not nervous/anxious.     Physical Exam  Constitutional: No distress.  Eyes: Conjunctivae are normal.  Neck: Neck supple. No JVD present. No thyromegaly present.  Cardiovascular: Normal rate, regular rhythm and intact distal pulses.   Respiratory: Effort normal and breath sounds normal. No respiratory distress. He has no wheezes.  GI: Soft. Bowel sounds are normal. He exhibits no distension. There is no tenderness.  Musculoskeletal: He exhibits no edema.  Able to move all extremities  Status post left bka  Lymphadenopathy:    He has no cervical adenopathy.  Neurological: He is alert.  Skin: Skin is warm and dry. He is not diaphoretic.  Left stump in ace wrap   Psychiatric: He has a normal mood and affect.     ASSESSMENT/ PLAN:  1. Hypertension: will continue coreg 3.125 mg twice daily and will monitor   2. Diabetes: will continue novolog 5 units with meals and lantus 17 units nightly   3. PVD with ulceration of left foot: is status post left bka; will follow up with [facility] in 4 weeks; will continue asa 81 mg daily will continue neurontin 300 mg three times daily   4. Left bka: will continue therapy as directed; will continue incision line treatment as directed; will continue percocet 5/325 mg 1 or 2 tabs every 6 hours as needed  5. PUD: will continue pepcid 20 mg daily   6. Chronic hepatitis C; will monitor  7. Phantom limb pain: will continue neurontin 300 mg three times daily   8. CKD stage III: bun/creat 24/1.02; will monitor  9. BPH: will continue flomax daily   10.  Cva: is neurologically stable will continue asa 81 mg daily   11. ETOH abuse: is on folic acid and thiamine  12.  Constipation: will continue miralax daily and  senna s 2 tabs twice daily    Will check bmp this week   Time spent with patient 50 minutes >50% time spent counseling; reviewing medical record; tests; labs; and developing future plan of care   Ok Edwards NP [facility] Adult Medicine  Contact [phone] Monday through Friday 8am- 5pm  After hours call [phone]

## 2015-09-06 NOTE — Progress Notes (Signed)
Social Work  Discharge Note  The overall goal for the admission was met for:   Discharge location: No - plan changed to SNF as pt without any assistance at home  Length of Stay: Yes - 15 days  Discharge activity level: Yes - supervison / light min assist  Home/community participation: Yes  Services provided included: MD, RD, PT, OT, RN, TR, Pharmacy and SW  Financial Services: Medicaid  Follow-up services arranged: Other: SNF at [facility]  Comments (or additional information):  Patient/Family verbalized understanding of follow-up arrangements: Yes  Individual responsible for coordination of the follow-up plan: pt  Confirmed correct DME delivered: NA   Cory Weber

## 2015-09-07 ENCOUNTER — Encounter: Payer: Self-pay | Admitting: Internal Medicine

## 2015-09-07 ENCOUNTER — Non-Acute Institutional Stay (SKILLED_NURSING_FACILITY): Payer: Medicaid Other | Admitting: Internal Medicine

## 2015-09-07 ENCOUNTER — Telehealth: Payer: Self-pay

## 2015-09-07 DIAGNOSIS — E1159 Type 2 diabetes mellitus with other circulatory complications: Secondary | ICD-10-CM

## 2015-09-07 DIAGNOSIS — D62 Acute posthemorrhagic anemia: Secondary | ICD-10-CM | POA: Diagnosis not present

## 2015-09-07 DIAGNOSIS — S88112A Complete traumatic amputation at level between knee and ankle, left lower leg, initial encounter: Secondary | ICD-10-CM

## 2015-09-07 DIAGNOSIS — E785 Hyperlipidemia, unspecified: Secondary | ICD-10-CM

## 2015-09-07 DIAGNOSIS — L089 Local infection of the skin and subcutaneous tissue, unspecified: Secondary | ICD-10-CM | POA: Diagnosis not present

## 2015-09-07 DIAGNOSIS — E1142 Type 2 diabetes mellitus with diabetic polyneuropathy: Secondary | ICD-10-CM

## 2015-09-07 DIAGNOSIS — F1010 Alcohol abuse, uncomplicated: Secondary | ICD-10-CM | POA: Diagnosis not present

## 2015-09-07 DIAGNOSIS — I10 Essential (primary) hypertension: Secondary | ICD-10-CM | POA: Diagnosis not present

## 2015-09-07 DIAGNOSIS — K219 Gastro-esophageal reflux disease without esophagitis: Secondary | ICD-10-CM | POA: Diagnosis not present

## 2015-09-07 DIAGNOSIS — Z89512 Acquired absence of left leg below knee: Secondary | ICD-10-CM

## 2015-09-07 DIAGNOSIS — N400 Enlarged prostate without lower urinary tract symptoms: Secondary | ICD-10-CM | POA: Diagnosis not present

## 2015-09-07 DIAGNOSIS — B182 Chronic viral hepatitis C: Secondary | ICD-10-CM | POA: Diagnosis not present

## 2015-09-07 NOTE — Progress Notes (Signed)
MRN: [id number] Name: Cory Weber  Sex: male Age: 65 y.o. DOB: 09-30-1950  [place] #:  Facility/Room: [facility] / [address] B Level Of Care: SNF Provider: Noah Delaine. Sheppard Coil, MD Emergency Contacts: Extended Emergency Contact Information Primary Emergency Contact: Tye Savoy, [place] [address] Johnnette Litter of [place] Phone: [phone] Relation: Sister Secondary Emergency Contact: Kerrin Mo, [place] [address] Johnnette Litter of [place] Mobile Phone: [phone] Relation: Sister  Code Status: Full Code  Allergies: Review of patient's allergies indicates no known allergies.  Chief Complaint  Patient presents with  . New Admit To SNF    Admit to Facility    HPI: Patient is 65 y.o. male wITH HTN, DM2, tobacco abuse, hepatitis C, CKD PVD with recent foot surgery and was admitted on 08/15/15 with cellulitis and maggots in the wound. LLE not felt to be salvageable and he underwent L-BKA on 08/16/15 by Dr. Trula Slade.  He was treated with IV antibiotics for SIRS and then transitioned to  doxycyline due to ongoing low grade fevers and leucocytosis which he took untl 7/13. ABLA anemia being monitored and pain control was improving and pt was admited to [facility] from 7/10-25. Here is wound dehised but drainage stopped and doxy d/c on 7/13. BUN/Cr improved with decreased ACE and incresed po intake. Pt is admitted to SNF for genralized weakness and for OT/PT. While at SNF pt will be followed for HTN, tx with coreg, ETOH dependence , tx with folate and thiamine, GERD, tx with pepcid and BPH, tx with flomax.  Past Medical History:  Diagnosis Date  . Anemia   . Asthma   . Cellulitis and abscess of foot 07/2015  . CVA (cerebral vascular accident) ([place]) 2015   denies residual on 08/15/2015  . Hepatitis C    states he was diagnosed in 2007 or 2007 while living in [place], [place]  . Hyperlipidemia   . Hypertension   . Peripheral neuropathy ([place])   . Pneumonia X 1  . PVD  (peripheral vascular disease) ([place])   . Type II diabetes mellitus ([place])     Past Surgical History:  Procedure Laterality Date  . AMPUTATION Left 08/16/2015   Procedure: LEFT BELOW THE KNEE AMPUTATION ;  Surgeon: Serafina Mitchell, MD;  Location: [facility];  Service: Vascular;  Laterality: Left;  . BACK SURGERY    . BIOPSY N/A 09/03/2012   Procedure: BIOPSY;  Surgeon: Daneil Dolin, MD;  Location[facility];  Service: Endoscopy;  Laterality: N/A;  gastric and gastric mucosa  . BIOPSY N/A 12/03/2012   Procedure: BIOPSY;  Surgeon: Daneil Dolin, MD;  Location[facility];  Service: Endoscopy;  Laterality: N/A;  . COLONOSCOPY WITH PROPOFOL N/A 09/03/2012   [id number] polyp-removed as outlined above. Prominent internal hemorrhoids. Tubular adenoma  . ESOPHAGOGASTRODUODENOSCOPY (EGD) WITH PROPOFOL N/A 09/03/2012   [id number] hernia. Gastric diverticulum. Gastric ulcers with associated erosions. Duodenal erosions. Status post gastric biopsy. H.PYLORI gastritis   . ESOPHAGOGASTRODUODENOSCOPY (EGD) WITH PROPOFOL N/A 12/03/2012   Dr. Gala Romney: gastric diverticulum, gastric erosions and scar. Previously noted gastric ulcer completed healed. Biopsy without H.pylori.   Marland Kitchen LIVER BIOPSY  2005   Done in [place], [place]. Chronic hepatitis with mild periportal inflammation, lobular unicellular necrosis and portal fibrosis. Grade 2, stage 1-2.  Marland Kitchen MAXIMUM ACCESS (MAS)POSTERIOR LUMBAR INTERBODY FUSION (PLIF) 1 LEVEL N/A 11/25/2013   Procedure: FOR MAXIMUM ACCESS (MAS) POSTERIOR LUMBAR INTERBODY FUSION (PLIF)  1 LEVEL;  Surgeon: Eustace Moore, MD;  Location: [facility];  Service: Neurosurgery;  Laterality: N/A;  FOR MAXIMUM ACCESS (MAS) POSTERIOR LUMBAR INTERBODY FUSION (PLIF) 1 LEVEL LUMBAR 3-4  . PERIPHERAL VASCULAR CATHETERIZATION Left 08/01/2015   Procedure: Lower Extremity Angiography;  Surgeon: Serafina Mitchell, MD;  Location: [facility];  Service: Cardiovascular;  Laterality: Left;  . PERIPHERAL VASCULAR  CATHETERIZATION N/A 08/01/2015   Procedure: Abdominal Aortogram;  Surgeon: Serafina Mitchell, MD;  Location: [facility];  Service: Cardiovascular;  Laterality: N/A;  . PERIPHERAL VASCULAR CATHETERIZATION N/A 08/08/2015   Procedure: Abdominal Aortogram w/Lower Extremity;  Surgeon: Serafina Mitchell, MD;  Location: [facility];  Service: Cardiovascular;  Laterality: N/A;  . PERIPHERAL VASCULAR CATHETERIZATION Left 08/08/2015   Procedure: Peripheral Vascular Balloon Angioplasty;  Surgeon: Serafina Mitchell, MD;  Location: [facility];  Service: Cardiovascular;  Laterality: Left;  left popiteal artery, left peronealtrunk, left post tibial  . POLYPECTOMY N/A 09/03/2012   Procedure: POLYPECTOMY;  Surgeon: Daneil Dolin, MD;  [facility];  Service: Endoscopy;  Laterality: N/A;  cecal polyp  . TONSILLECTOMY        Medication List       Accurate as of 09/07/15  9:44 AM. Always use your most recent med list.          acidophilus Caps capsule Take 2 capsules by mouth daily.   aspirin EC 81 MG tablet Take 1 tablet (81 mg total) by mouth daily.   carvedilol 3.125 MG tablet Commonly known as:  COREG Take 1 tablet (3.125 mg total) by mouth 2 (two) times daily with a meal.   famotidine 20 MG tablet Commonly known as:  PEPCID Take 1 tablet (20 mg total) by mouth daily.   folic acid 1 MG tablet Commonly known as:  FOLVITE Take 1 tablet (1 mg total) by mouth daily.   gabapentin 300 MG capsule Commonly known as:  NEURONTIN Take 1 capsule (300 mg total) by mouth 3 (three) times daily.   insulin aspart 100 UNIT/ML injection Commonly known as:  novoLOG Inject 5 Units into the skin 3 (three) times daily with meals.   insulin glargine 100 UNIT/ML injection Commonly known as:  LANTUS Inject 0.17 mLs (17 Units total) into the skin daily.   multivitamin with minerals Tabs tablet Take 1 tablet by mouth daily.   oxyCODONE-acetaminophen 5-325 MG tablet Commonly known as:   PERCOCET/ROXICET Take 1-2 tablets by mouth every 6 (six) hours as needed for severe pain.   polyethylene glycol packet Commonly known as:  MIRALAX / GLYCOLAX Take 17 g by mouth daily.   senna-docusate 8.6-50 MG tablet Commonly known as:  Senokot-S Take 2 tablets by mouth 2 (two) times daily.   simvastatin 20 MG tablet Commonly known as:  ZOCOR Take 20 mg by mouth daily at 6 PM.   tamsulosin 0.4 MG Caps capsule Commonly known as:  FLOMAX Take 1 capsule (0.4 mg total) by mouth daily.   thiamine 100 MG tablet Take 1 tablet (100 mg total) by mouth daily.       Meds ordered this encounter  Medications  . simvastatin (ZOCOR) 20 MG tablet    Sig: Take 20 mg by mouth daily at 6 PM.    Immunization History  Administered Date(s) Administered  . Influenza,inj,Quad PF,36+ Mos 11/24/2013  . Pneumococcal Polysaccharide-23 11/24/2013    Social History  Substance Use Topics  . Smoking status: Former Smoker  Packs/day: 1.00    Years: 47.00    Types: Cigarettes    Quit date: 07/13/2015  . Smokeless tobacco: Never Used  . Alcohol use 0.0 oz/week     Comment: 08/15/2015 "stopped drinking 3-4 years ago"    Family history is   Family History  Problem Relation Age of Onset  . Breast cancer Mother     deceased  . Cancer Mother   . Heart failure Mother   . Diabetes Father   . Hypertension Father   . Heart disease Father     deceased  . Hyperlipidemia Father   . Diabetes Sister   . Hypertension Sister   . Breast cancer Sister   . Colon cancer Neg Hx   . Liver disease Neg Hx       Review of Systems  DATA OBTAINED: from patient, nurse GENERAL:  no fevers, fatigue, appetite changes SKIN: No itching, rash or wounds EYES: No eye pain, redness, discharge EARS: No earache, tinnitus, change in hearing NOSE: No congestion, drainage or bleeding  MOUTH/THROAT: No mouth or tooth pain, No sore throat RESPIRATORY: No cough, wheezing, SOB CARDIAC: No chest pain, palpitations,  lower extremity edema  GI: No abdominal pain, No N/V/D or constipation, No heartburn or reflux  GU: No dysuria, frequency or urgency, or incontinence  MUSCULOSKELETAL: No unrelieved bone/joint pain NEUROLOGIC: No headache, dizziness or focal weakness PSYCHIATRIC: No c/o anxiety or sadness   Vitals:   09/07/15 0932  BP: 110/74  Pulse: 70  Resp: 17  Temp: 98.1 F (36.7 C)    SpO2 Readings from Last 1 Encounters:  09/06/15 96%        Physical Exam  GENERAL APPEARANCE: Alert, conversant,  No acute distress.  SKIN: No diaphoresis rash HEAD: Normocephalic, atraumatic  EYES: Conjunctiva/lids clear. Pupils round, reactive. EOMs intact.  EARS: External exam WNL, canals clear. Hearing grossly normal.  NOSE: No deformity or discharge.  MOUTH/THROAT: Lips w/o lesions  RESPIRATORY: Breathing is even, unlabored. Lung sounds are clear   CARDIOVASCULAR: Heart RRR no murmurs, rubs or gallops. No peripheral edema.   GASTROINTESTINAL: Abdomen is soft, non-tender, not distended w/ normal bowel sounds. GENITOURINARY: Bladder non tender, not distended  MUSCULOSKELETAL: new L BKA with dressing ` NEUROLOGIC:  Cranial nerves 2-12 grossly intact. Moves all extremities  PSYCHIATRIC: Mood and affect appropriate to situation, no behavioral issues  Patient Active Problem List   Diagnosis Date Noted  . Pyrexia   . Amputation of left lower extremity below knee ([facility]) 08/21/2015  . Abnormality of gait   . Phantom limb pain ([facility])   . Anemia of chronic disease   . DM type 2 with diabetic peripheral neuropathy ([facility])   . HLD (hyperlipidemia)   . BPH (benign prostatic hyperplasia)   . ETOH abuse   . CKD (chronic kidney disease)   . PVD (peripheral vascular disease) ([facility])   . Chronic hepatitis C without hepatic coma ([facility])   . History of CVA (cerebrovascular accident) without residual deficits   . Leukocytosis   . SIRS (systemic inflammatory response syndrome) ([facility])   . Tachycardia   . Hyponatremia    . Diabetic wet gangrene of the foot ([facility]) 08/15/2015  . AKI (acute kidney injury) ([facility])   . Atherosclerosis of left lower extremity with ulceration of midfoot ([facility])   . Left foot infection 07/23/2015  . Type 2 diabetes mellitus with vascular disease ([facility]) 07/23/2015  . Diabetes mellitus with foot ulcer, without long-term current use of insulin ([facility]) 07/18/2015  .  Chronic kidney disease, stage 3 07/18/2015  . Essential hypertension 07/18/2015  . Dyspepsia 04/13/2014  . Acute blood loss anemia 12/01/2013  . Constipation 12/01/2013  . Radiculopathy of lumbar region 11/29/2013  . Acute right lumbar radiculopathy 11/26/2013  . S/P lumbar spinal fusion 11/25/2013  . Lumbar spinal stenosis 11/24/2013  . Right leg weakness 11/23/2013  . PUD (peptic ulcer disease) 11/19/2012  . IDA (iron deficiency anemia) 07/10/2012  . Unspecified constipation 05/13/2012  . Normocytic anemia 12/03/2011  . Melena 12/03/2011  . DM (diabetes mellitus), type 2, uncontrolled ([facility]) 02/03/2011  . Accelerated hypertension 02/03/2011  . Hemiparesthesia 02/03/2011  . Polysubstance abuse 02/03/2011  . Tobacco abuse 02/03/2011       Component Value Date/Time   WBC 8.1 09/01/2015 0519   RBC 3.14 (L) 09/01/2015 0519   HGB 8.7 (L) 09/01/2015 0519   HCT 27.6 (L) 09/01/2015 0519   HCT 32 10/31/2011 1319   PLT 456 (H) 09/01/2015 0519   MCV 87.9 09/01/2015 0519   MCV 82.8 10/31/2011 1319   LYMPHSABS 2.4 08/26/2015 0504   MONOABS 0.7 08/26/2015 0504   EOSABS 0.4 08/26/2015 0504   BASOSABS 0.0 08/26/2015 0504        Component Value Date/Time   NA 134 (L) 09/04/2015 0534   NA 134 (A) 09/04/2015   K 4.7 09/04/2015 0534   K 4.0 10/31/2011 1319   CL 101 09/04/2015 0534   CO2 28 09/04/2015 0534   GLUCOSE 117 (H) 09/04/2015 0534   BUN 24 (H) 09/04/2015 0534   BUN 24 (A) 09/04/2015   CREATININE 1.02 09/04/2015 0534   CREATININE 0.99 10/31/2011 1319   CALCIUM 9.5 09/04/2015 0534   CALCIUM 9.0 10/31/2011 1319    PROT 8.4 (H) 08/21/2015 0329   ALBUMIN 2.6 (L) 08/21/2015 0329   ALBUMIN 4.0 10/31/2011 1319   AST 21 08/21/2015 0329   AST 26 10/31/2011 1319   ALT 15 (L) 08/21/2015 0329   ALKPHOS 49 08/21/2015 0329   ALKPHOS 51 10/31/2011 1319   BILITOT 0.4 08/21/2015 0329   BILITOT 0.3 10/31/2011 1319   GFRNONAA >60 09/04/2015 0534   GFRAA >60 09/04/2015 0534    Lab Results  Component Value Date   HGBA1C 7.0 (H) 07/24/2015    Lab Results  Component Value Date   CHOL 127 11/24/2013   HDL 44 11/24/2013   LDLCALC 62 11/24/2013   TRIG 103 11/24/2013   CHOLHDL 2.9 11/24/2013     No results found.  Not all labs, radiology exams or other studies done during hospitalization come through on my EPIC note; however they are reviewed by me.    Assessment and Plan  L BKA 7/5 /CELLULITIS/PVD- tx with IV abx then on a prolonged course of doxycycline ending 7/13; wound had a small dehiscence that drained but drainage resolved and never got infected SNF - admitted for contiued OT/PT  ABLA - not stated if pt was transfused SNF - it appears pt went into the hospital with anemia and d/c Hb 8.1; will f/u CBC  DM2 - it lookls like pt came inon glucophage ten started on insulin which was titrated while in inpt rehab SNF - will cont lantus 17 u daily and novolog 5u ac; pt was on an ACE, he is not anymore  DIABETIC NEUROPATHY- pt's neurontin was titrated up while pt was in inpt rehab for pain control SNF - will cont neurontin 300 mg TID  HLD SNF - not stated as uncontrolled;plan cont zocor 20 mg daily  BPH- no problems stated SNF - cont flomax  ETOH abuse SNF - will maintain folic acid 1 mg daily , MVI daily and thiamine 100 mg daily started in hospital  HTN SNF - cont coreg 3.125 mg BID  GERD- h/o PUD SNF - cont pepcid 20 mg daily  CHRONIC HEPATITIC C    Time spent . 45 min;> 50% of time with patient was spent reviewing records, labs, tests and studies, counseling and developing plan  of care  Webb Silversmith D. Sheppard Coil, MD

## 2015-09-07 NOTE — Telephone Encounter (Signed)
1. Are you/is patient experiencing any problems since coming home? Are there any questions regarding any aspect of care? No issues.  2. Are there any questions regarding medications administration/dosing? Are meds being taken as prescribed? Patient should review meds with caller to confirm. Meds have been confirmed.  3. Have there been any falls? No falls. 4. Has Home Health been to the house and/or have they contacted you? If not, have you tried to contact them? Can we help you contact them? Pt is doing rehab at [facility]. 5. Are bowels and bladder emptying properly? Are there any unexpected incontinence issues? If applicable, is patient following bowel/bladder programs? Some constipation. 6. Any fevers, problems with breathing, unexpected pain? No issues 7. Are there any skin problems or new areas of breakdown? No issues 8. Has the patient/family member arranged specialty MD follow up (ie cardiology/neurology/renal/surgical/etc)?  Can we help arrange? Pt is currently making follow up appointments.  9. Does the patient need any other services or support that we can help arrange? No 10. Are caregivers following through as expected in assisting the patient? Pt is at [facility].       11. Has the patient quit smoking, drinking alcohol, or using drugs as recommended? Pt is not smoking, drinking alcohol, or using drugs.   Spoke with pt. Pt is aware of appointment on 09/15/15 at 11:30 am.

## 2015-09-14 ENCOUNTER — Encounter: Payer: Self-pay | Admitting: Surgery

## 2015-09-15 ENCOUNTER — Encounter: Payer: Medicaid Other | Attending: Physical Medicine & Rehabilitation | Admitting: Physical Medicine & Rehabilitation

## 2015-09-15 ENCOUNTER — Encounter: Payer: Self-pay | Admitting: Physical Medicine & Rehabilitation

## 2015-09-15 VITALS — BP 155/86 | HR 74

## 2015-09-15 DIAGNOSIS — I10 Essential (primary) hypertension: Secondary | ICD-10-CM

## 2015-09-15 DIAGNOSIS — N189 Chronic kidney disease, unspecified: Secondary | ICD-10-CM | POA: Diagnosis not present

## 2015-09-15 DIAGNOSIS — G8918 Other acute postprocedural pain: Secondary | ICD-10-CM

## 2015-09-15 DIAGNOSIS — G546 Phantom limb syndrome with pain: Secondary | ICD-10-CM | POA: Diagnosis not present

## 2015-09-15 DIAGNOSIS — B182 Chronic viral hepatitis C: Secondary | ICD-10-CM

## 2015-09-15 DIAGNOSIS — Z5189 Encounter for other specified aftercare: Secondary | ICD-10-CM | POA: Insufficient documentation

## 2015-09-15 DIAGNOSIS — E1159 Type 2 diabetes mellitus with other circulatory complications: Secondary | ICD-10-CM

## 2015-09-15 DIAGNOSIS — R52 Pain, unspecified: Secondary | ICD-10-CM | POA: Insufficient documentation

## 2015-09-15 DIAGNOSIS — Z87891 Personal history of nicotine dependence: Secondary | ICD-10-CM | POA: Diagnosis not present

## 2015-09-15 DIAGNOSIS — B1920 Unspecified viral hepatitis C without hepatic coma: Secondary | ICD-10-CM | POA: Insufficient documentation

## 2015-09-15 DIAGNOSIS — N183 Chronic kidney disease, stage 3 (moderate): Secondary | ICD-10-CM

## 2015-09-15 DIAGNOSIS — Z89512 Acquired absence of left leg below knee: Secondary | ICD-10-CM | POA: Insufficient documentation

## 2015-09-15 DIAGNOSIS — E1140 Type 2 diabetes mellitus with diabetic neuropathy, unspecified: Secondary | ICD-10-CM | POA: Insufficient documentation

## 2015-09-15 DIAGNOSIS — I129 Hypertensive chronic kidney disease with stage 1 through stage 4 chronic kidney disease, or unspecified chronic kidney disease: Secondary | ICD-10-CM | POA: Diagnosis not present

## 2015-09-15 DIAGNOSIS — E1122 Type 2 diabetes mellitus with diabetic chronic kidney disease: Secondary | ICD-10-CM | POA: Insufficient documentation

## 2015-09-15 NOTE — Progress Notes (Signed)
Subjective:    Patient ID: Cory Weber, male    DOB: 1950/02/27, 65 y.o.   MRN: [id number]  HPI 65 y.o. Male with history of HTN, T2DM, tobacco abuse, hepatitis C, CKD, PVD, who presents for transitional care management after receiving CIR after left BKA.   Admit date: 08/21/2015 Discharge date: 09/05/2015 Pt seen with prosthetist present. He was discharged to a SNF.  At discharge, pt was instructed to obtain BMET, which he is not sure if he has done.  He has been having his CBGs, but he does not recall the values.  He sees Vascular on Monday.  He complains of pain, mainly phantom pain.    Mobility: Wheelchair at all times, has been practicing on the walker. Therapies: at SNF  Pain Inventory Average Pain 8 Pain Right Now 7 My pain is intermittent and stabbing  In the last 24 hours, has pain interfered with the following? General activity 0 Relation with others 0 Enjoyment of life 0 What TIME of day is your pain at its worst? morning, evening  Sleep (in general) Good  Pain is worse with: unsure Pain improves with: medication Relief from Meds: 5  Mobility walk with assistance use a walker use a wheelchair transfers alone  Function disabled: date disabled . I need assistance with the following:  bathing, meal prep, household duties and shopping  Neuro/Psych numbness tremor trouble walking  Prior Studies TC / hospital f/u  Physicians involved in your care TC / hospital f/u   Family History  Problem Relation Age of Onset  . Breast cancer Mother     deceased  . Cancer Mother   . Heart failure Mother   . Diabetes Father   . Hypertension Father   . Heart disease Father     deceased  . Hyperlipidemia Father   . Diabetes Sister   . Hypertension Sister   . Breast cancer Sister   . Colon cancer Neg Hx   . Liver disease Neg Hx    Social History   Social History  . Marital status: Divorced    Spouse name: N/A  . Number of children: 1  . Years of  education: N/A   Occupational History  . trying to get disability    Social History Main Topics  . Smoking status: Former Smoker    Packs/day: 1.00    Years: 47.00    Types: Cigarettes    Quit date: 07/13/2015  . Smokeless tobacco: Never Used  . Alcohol use 0.0 oz/week     Comment: 08/15/2015 "stopped drinking 3-4 years ago"  . Drug use:     Types: Cocaine     Comment: 08/15/2015 "last used cocaine 3-4 months ago"  . Sexual activity: No   Other Topics Concern  . None   Social History Narrative  . None   Past Surgical History:  Procedure Laterality Date  . AMPUTATION Left 08/16/2015   Procedure: LEFT BELOW THE KNEE AMPUTATION ;  Surgeon: Serafina Mitchell, MD;  Location: [facility];  Service: Vascular;  Laterality: Left;  . BACK SURGERY    . BIOPSY N/A 09/03/2012   Procedure: BIOPSY;  Surgeon: Daneil Dolin, MD;  [facility];  Service: Endoscopy;  Laterality: N/A;  gastric and gastric mucosa  . BIOPSY N/A 12/03/2012   Procedure: BIOPSY;  Surgeon: Daneil Dolin, MD;  Location[facility];  Service: Endoscopy;  Laterality: N/A;  . COLONOSCOPY WITH PROPOFOL N/A 09/03/2012   [id number] polyp-removed as outlined above.  Prominent internal hemorrhoids. Tubular adenoma  . ESOPHAGOGASTRODUODENOSCOPY (EGD) WITH PROPOFOL N/A 09/03/2012   [id number] hernia. Gastric diverticulum. Gastric ulcers with associated erosions. Duodenal erosions. Status post gastric biopsy. H.PYLORI gastritis   . ESOPHAGOGASTRODUODENOSCOPY (EGD) WITH PROPOFOL N/A 12/03/2012   Dr. Gala Romney: gastric diverticulum, gastric erosions and scar. Previously noted gastric ulcer completed healed. Biopsy without H.pylori.   Marland Kitchen LIVER BIOPSY  2005   Done in [place], [place]. Chronic hepatitis with mild periportal inflammation, lobular unicellular necrosis and portal fibrosis. Grade 2, stage 1-2.  Marland Kitchen MAXIMUM ACCESS (MAS)POSTERIOR LUMBAR INTERBODY FUSION (PLIF) 1 LEVEL N/A 11/25/2013   Procedure: FOR MAXIMUM ACCESS (MAS) POSTERIOR LUMBAR INTERBODY  FUSION (PLIF) 1 LEVEL;  Surgeon: Eustace Moore, MD;  Location: [facility];  Service: Neurosurgery;  Laterality: N/A;  FOR MAXIMUM ACCESS (MAS) POSTERIOR LUMBAR INTERBODY FUSION (PLIF) 1 LEVEL LUMBAR 3-4  . PERIPHERAL VASCULAR CATHETERIZATION Left 08/01/2015   Procedure: Lower Extremity Angiography;  Surgeon: Serafina Mitchell, MD;  Location: [facility];  Service: Cardiovascular;  Laterality: Left;  . PERIPHERAL VASCULAR CATHETERIZATION N/A 08/01/2015   Procedure: Abdominal Aortogram;  Surgeon: Serafina Mitchell, MD;  Location: [facility];  Service: Cardiovascular;  Laterality: N/A;  . PERIPHERAL VASCULAR CATHETERIZATION N/A 08/08/2015   Procedure: Abdominal Aortogram w/Lower Extremity;  Surgeon: Serafina Mitchell, MD;  Location: [facility];  Service: Cardiovascular;  Laterality: N/A;  . PERIPHERAL VASCULAR CATHETERIZATION Left 08/08/2015   Procedure: Peripheral Vascular Balloon Angioplasty;  Surgeon: Serafina Mitchell, MD;  Location: [facility];  Service: Cardiovascular;  Laterality: Left;  left popiteal artery, left peronealtrunk, left post tibial  . POLYPECTOMY N/A 09/03/2012   Procedure: POLYPECTOMY;  Surgeon: Daneil Dolin, MD;  Location[facility];  Service: Endoscopy;  Laterality: N/A;  cecal polyp  . TONSILLECTOMY     Past Medical History:  Diagnosis Date  . Anemia   . Asthma   . Cellulitis and abscess of foot 07/2015  . CVA (cerebral vascular accident) ([facility]) 2015   denies residual on 08/15/2015  . Hepatitis C    states he was diagnosed in 2007 or 2007 while living in [place], [place]  . Hyperlipidemia   . Hypertension   . Peripheral neuropathy ([place])   . Pneumonia X 1  . PVD (peripheral vascular disease) ([place])   . Type II diabetes mellitus ([facility])    BP (!) 155/86 (BP Location: Right Arm, Patient Position: Sitting, Cuff Size: Large)   Pulse 74   SpO2 94%   Opioid Risk Score:   Fall Risk Score:  `1  Depression screen PHQ 2/9  Depression screen PHQ 2/9  02/22/2015  Decreased Interest 0  Down, Depressed, Hopeless 0  PHQ - 2 Score 0    Review of Systems  HENT: Negative.   Eyes: Negative.   Respiratory: Negative.   Cardiovascular: Negative.   Gastrointestinal: Negative.   Endocrine:       Diabetic  Genitourinary: Negative.   Musculoskeletal: Positive for gait problem.  Allergic/Immunologic: Negative.   Neurological: Positive for tremors and numbness.  Hematological: Negative.   All other systems reviewed and are negative.     Objective:   Physical Exam Constitutional: He appears well-developed and well-nourished.  HENT: Normocephalic and atraumatic.  Cardiovascular: Normal rate and regular rhythm.  Respiratory: Effort normal and breath sounds normal. No respiratory distress.  GI: Soft. Bowel sounds are normal. He exhibits no distension.  Musculoskeletal: He exhibits Mild edema, no tenderness. Stump well-shaped Neurological: He is  alert and oriented.  Motor: B/l UE, RLE: 5/5 proximal to distal LLE: hip flexion 5/5  Skin: Skin is warm and dry.  Amputation site still with no drainage.  Steristrips in place.  Psychiatric: He has a normal mood and affect. His behavior is normal.     Assessment & Plan:  64 y.o. Male with history of HTN, T2DM, tobacco abuse, hepatitis C, CKD, PVD, who presents for transitional care management after receiving CIR after left BKA.    1.  S/p left BKA 08/16/2015 after recent left popliteal angioplasty.  Cont therapies at SNF  Cont meds  Follow up with Vascular surgery  Cont shrinker  Cont wheelchair for safety transition to walker  2. Pain Management with phantom limb pain  Increase Neurontin to 600 TID   Change Percocet to Oxycodone   Cont  massage, visual feedback  3. Diabetes mellitus with peripheral neuropathy.  Cont meds  Cont CBGs   4. Hypertension  Cont meds  Slill slightly elevated  5. Chronic renal insufficiency  With hyponatremia and hypokalemia  Follow up for BMP                         6. Hepatitis C  Avoid hepatotoxic meds.  7. Skin care  No need for Kerlix  Cont shrinker  May moisturize areas of dry skin, but NOT on/around incision

## 2015-09-18 ENCOUNTER — Ambulatory Visit (INDEPENDENT_AMBULATORY_CARE_PROVIDER_SITE_OTHER): Payer: Self-pay | Admitting: Surgery

## 2015-09-18 ENCOUNTER — Encounter: Payer: Self-pay | Admitting: Surgery

## 2015-09-18 VITALS — BP 128/74 | HR 70 | Temp 97.9°F | Resp 16 | Ht 73.0 in | Wt 190.0 lb

## 2015-09-18 DIAGNOSIS — I7025 Atherosclerosis of native arteries of other extremities with ulceration: Secondary | ICD-10-CM

## 2015-09-18 LAB — BASIC METABOLIC PANEL
BUN: 10 mg/dL (ref 4–21)
CREATININE: 0.9 mg/dL (ref 0.6–1.3)
Glucose: 166 mg/dL
POTASSIUM: 4.1 mmol/L (ref 3.4–5.3)
SODIUM: 140 mmol/L (ref 137–147)

## 2015-09-18 NOTE — Progress Notes (Signed)
Patient name: Cory Weber MRN: [id number] DOB: Mar 12, 1950 Sex: male  REASON FOR VISIT: post-op  HPI: Cory Weber is a 65 y.o. male who returns today for his first postoperative visit.  He is status post left below knee amputation.  The patient did undergo an attempt at revascularization.  Unfortunately the patient re-presented with maggots in his wound and the leg was deemed to be nonsalvageable.  He underwent left below-knee amputation on 08/17/2015.  He reports no complaints today.  He is tolerating his wife at home.  He does not have any pain.  He is being evaluated for prosthesis.  Current Outpatient Prescriptions  Medication Sig Dispense Refill  . acidophilus (RISAQUAD) CAPS capsule Take 2 capsules by mouth daily.    Marland Kitchen aspirin EC 81 MG tablet Take 1 tablet (81 mg total) by mouth daily. 100 tablet 3  . carvedilol (COREG) 3.125 MG tablet Take 1 tablet (3.125 mg total) by mouth 2 (two) times daily with a meal. 60 tablet 0  . famotidine (PEPCID) 20 MG tablet Take 1 tablet (20 mg total) by mouth daily. 30 tablet 0  . folic acid (FOLVITE) 1 MG tablet Take 1 tablet (1 mg total) by mouth daily. 30 tablet 0  . gabapentin (NEURONTIN) 300 MG capsule Take 1 capsule (300 mg total) by mouth 3 (three) times daily.    . insulin aspart (NOVOLOG) 100 UNIT/ML injection Inject 5 Units into the skin 3 (three) times daily with meals. 10 mL 11  . insulin glargine (LANTUS) 100 UNIT/ML injection Inject 0.17 mLs (17 Units total) into the skin daily. 10 mL 11  . Multiple Vitamin (MULTIVITAMIN WITH MINERALS) TABS tablet Take 1 tablet by mouth daily. 30 tablet 0  . oxyCODONE-acetaminophen (PERCOCET/ROXICET) 5-325 MG tablet Take 1-2 tablets by mouth every 6 (six) hours as needed for severe pain. 12 tablet 0  . polyethylene glycol (MIRALAX / GLYCOLAX) packet Take 17 g by mouth daily. 14 each 0  . senna-docusate (SENOKOT-S) 8.6-50 MG tablet Take 2 tablets by mouth 2 (two)  times daily. 30 tablet 0  . simvastatin (ZOCOR) 20 MG tablet Take 20 mg by mouth daily at 6 PM.    . tamsulosin (FLOMAX) 0.4 MG CAPS capsule Take 1 capsule (0.4 mg total) by mouth daily. 30 capsule 0  . thiamine 100 MG tablet Take 1 tablet (100 mg total) by mouth daily. 30 tablet 0   No current facility-administered medications for this visit.     REVIEW OF SYSTEMS:  [X]  denotes positive finding, [ ]  denotes negative finding Cardiac  Comments:  Chest pain or chest pressure:    Shortness of breath upon exertion:    Short of breath when lying flat:    Irregular heart rhythm:    Constitutional    Fever or chills:      PHYSICAL EXAM: Vitals:   09/18/15 1605  BP: 128/74  Pulse: 70  Resp: 16  Temp: 97.9 F (36.6 C)  TempSrc: Oral  SpO2: 98%  Weight: 190 lb (86.2 kg)  Height: 6\' 1"  (1.854 m)    GENERAL: The patient is a well-nourished male, in no acute distress. The vital signs are documented above. CARDIOVASCULAR: There is a regular rate and rhythm. PULMONARY: There is good air exchange bilaterally without wheezing or rales. Left below knee incision is healing nicely.  Steri-Strips were removed today.  MEDICAL ISSUES: Status post left below-knee amputation: The patient will follow-up in 3 months.  He is artery in the process of  getting fitted for prosthesis.  He is wearing a stump shrinker.  Annamarie Major, MD [facility] [phone] Pager [phone]

## 2015-09-20 ENCOUNTER — Non-Acute Institutional Stay (SKILLED_NURSING_FACILITY): Payer: Medicaid Other | Admitting: Adult Health

## 2015-09-20 ENCOUNTER — Encounter: Payer: Self-pay | Admitting: Physical Medicine & Rehabilitation

## 2015-09-20 DIAGNOSIS — R600 Localized edema: Secondary | ICD-10-CM | POA: Diagnosis not present

## 2015-09-20 DIAGNOSIS — N183 Chronic kidney disease, stage 3 unspecified: Secondary | ICD-10-CM

## 2015-09-21 ENCOUNTER — Encounter: Payer: Self-pay | Admitting: Adult Health

## 2015-09-21 NOTE — Progress Notes (Signed)
Patient ID: Cory Weber, male   DOB: Jun 30, 1950, 65 y.o.   MRN: [id number]   Location:    [facility]ursing Home Room Number: 223-B Place of Service:  SNF (31)   CODE STATUS: Full Code  No Known Allergies  Chief Complaint  Patient presents with  . Acute Visit    Edema    HPI:  I have been asked to see him regarding his lower extremity edema. He feels as though the swelling is getting worse. He denies pain present; but does tell me that his leg is uncomfortable. He denies chest pain or shortness of breath.    Past Medical History:  Diagnosis Date  . Anemia   . Asthma   . Cellulitis and abscess of foot 07/2015  . CVA (cerebral vascular accident) ([place]) 2015   denies residual on 08/15/2015  . Hepatitis C    states he was diagnosed in 2007 or 2007 while living in [place], [place]  . Hyperlipidemia   . Hypertension   . Peripheral neuropathy ([place])   . Pneumonia X 1  . PVD (peripheral vascular disease) ([place])   . Type II diabetes mellitus ([place])     Past Surgical History:  Procedure Laterality Date  . AMPUTATION Left 08/16/2015   Procedure: LEFT BELOW THE KNEE AMPUTATION ;  Surgeon: Serafina Mitchell, MD;  Location: [place];  Service: Vascular;  Laterality: Left;  . BACK SURGERY    . BIOPSY N/A 09/03/2012   Procedure: BIOPSY;  Surgeon: Daneil Dolin, MD;  [facility]: [facility];  Service: Endoscopy;  Laterality: N/A;  gastric and gastric mucosa  . BIOPSY N/A 12/03/2012   Procedure: BIOPSY;  Surgeon: Daneil Dolin, MD;  [facility]: [facility];  Service: Endoscopy;  Laterality: N/A;  . COLONOSCOPY WITH PROPOFOL N/A 09/03/2012   [id number] polyp-removed as outlined above. Prominent internal hemorrhoids. Tubular adenoma  . ESOPHAGOGASTRODUODENOSCOPY (EGD) WITH PROPOFOL N/A 09/03/2012   [id number] hernia. Gastric diverticulum. Gastric ulcers with associated erosions. Duodenal erosions. Status post gastric biopsy. H.PYLORI gastritis   . ESOPHAGOGASTRODUODENOSCOPY (EGD) WITH PROPOFOL N/A 12/03/2012   Dr.  Gala Romney: gastric diverticulum, gastric erosions and scar. Previously noted gastric ulcer completed healed. Biopsy without H.pylori.   Marland Kitchen LIVER BIOPSY  2005   Done in [place], [place]. Chronic hepatitis with mild periportal inflammation, lobular unicellular necrosis and portal fibrosis. Grade 2, stage 1-2.  Marland Kitchen MAXIMUM ACCESS (MAS)POSTERIOR LUMBAR INTERBODY FUSION (PLIF) 1 LEVEL N/A 11/25/2013   Procedure: FOR MAXIMUM ACCESS (MAS) POSTERIOR LUMBAR INTERBODY FUSION (PLIF) 1 LEVEL;  Surgeon: Eustace Moore, MD;  Location: [place] [facility];  Service: Neurosurgery;  Laterality: N/A;  FOR MAXIMUM ACCESS (MAS) POSTERIOR LUMBAR INTERBODY FUSION (PLIF) 1 LEVEL LUMBAR 3-4  . PERIPHERAL VASCULAR CATHETERIZATION Left 08/01/2015   Procedure: Lower Extremity Angiography;  Surgeon: Serafina Mitchell, MD;  Location: [place] [facility];  Service: Cardiovascular;  Laterality: Left;  . PERIPHERAL VASCULAR CATHETERIZATION N/A 08/01/2015   Procedure: Abdominal Aortogram;  Surgeon: Serafina Mitchell, MD;  Location: [place] [facility];  Service: Cardiovascular;  Laterality: N/A;  . PERIPHERAL VASCULAR CATHETERIZATION N/A 08/08/2015   Procedure: Abdominal Aortogram w/Lower Extremity;  Surgeon: Serafina Mitchell, MD;  Location: [place] [facility];  Service: Cardiovascular;  Laterality: N/A;  . PERIPHERAL VASCULAR CATHETERIZATION Left 08/08/2015   Procedure: Peripheral Vascular Balloon Angioplasty;  Surgeon: Serafina Mitchell, MD;  Location: [place] [facility];  Service: Cardiovascular;  Laterality: Left;  left popiteal artery, left peronealtrunk, left post tibial  . POLYPECTOMY N/A  09/03/2012   Procedure: POLYPECTOMY;  Surgeon: Daneil Dolin, MD;  [facility];  Service: Endoscopy;  Laterality: N/A;  cecal polyp  . TONSILLECTOMY      Social History   Social History  . Marital status: Divorced    Spouse name: N/A  . Number of children: 1  . Years of education: N/A   Occupational History  . trying to get disability    Social History  Main Topics  . Smoking status: Former Smoker    Packs/day: 1.00    Years: 47.00    Types: Cigarettes    Quit date: 07/13/2015  . Smokeless tobacco: Never Used  . Alcohol use 0.0 oz/week     Comment: 08/15/2015 "stopped drinking 3-4 years ago"  . Drug use:     Types: Cocaine     Comment: 08/15/2015 "last used cocaine 3-4 months ago"  . Sexual activity: No   Other Topics Concern  . Not on file   Social History Narrative  . No narrative on file   Family History  Problem Relation Age of Onset  . Breast cancer Mother     deceased  . Cancer Mother   . Heart failure Mother   . Diabetes Father   . Hypertension Father   . Heart disease Father     deceased  . Hyperlipidemia Father   . Diabetes Sister   . Hypertension Sister   . Breast cancer Sister   . Colon cancer Neg Hx   . Liver disease Neg Hx       VITAL SIGNS BP (!) 149/62   Pulse 87   Ht 6\' 1"  (1.854 m)   Wt 185 lb (83.9 kg)   SpO2 97%   BMI 24.41 kg/m   Patient's Medications  New Prescriptions   No medications on file  Previous Medications   ACIDOPHILUS (RISAQUAD) CAPS CAPSULE    Take 2 capsules by mouth daily.   ASPIRIN EC 81 MG TABLET    Take 1 tablet (81 mg total) by mouth daily.   CARVEDILOL (COREG) 3.125 MG TABLET    Take 1 tablet (3.125 mg total) by mouth 2 (two) times daily with a meal.   FAMOTIDINE (PEPCID) 20 MG TABLET    Take 1 tablet (20 mg total) by mouth daily.   FOLIC ACID (FOLVITE) 1 MG TABLET    Take 1 tablet (1 mg total) by mouth daily.   GABAPENTIN (NEURONTIN) 300 MG CAPSULE    Take 1 capsule (300 mg total) by mouth 3 (three) times daily.   INSULIN ASPART (NOVOLOG) 100 UNIT/ML INJECTION    Inject 5 Units into the skin 3 (three) times daily with meals.   INSULIN GLARGINE (LANTUS) 100 UNIT/ML INJECTION    Inject 0.17 mLs (17 Units total) into the skin daily.   MULTIPLE VITAMIN (MULTIVITAMIN WITH MINERALS) TABS TABLET    Take 1 tablet by mouth daily.   OXYCODONE-ACETAMINOPHEN (PERCOCET/ROXICET)  5-325 MG TABLET    Take 1-2 tablets by mouth every 6 (six) hours as needed for severe pain.   POLYETHYLENE GLYCOL (MIRALAX / GLYCOLAX) PACKET    Take 17 g by mouth daily.   SENNA-DOCUSATE (SENOKOT-S) 8.6-50 MG TABLET    Take 2 tablets by mouth 2 (two) times daily.   SIMVASTATIN (ZOCOR) 20 MG TABLET    Take 20 mg by mouth daily at 6 PM.   TAMSULOSIN (FLOMAX) 0.4 MG CAPS CAPSULE    Take 1 capsule (0.4 mg total) by mouth daily.  THIAMINE 100 MG TABLET    Take 1 tablet (100 mg total) by mouth daily.  Modified Medications   No medications on file  Discontinued Medications   No medications on file     SIGNIFICANT DIAGNOSTIC EXAMS  08-03-15: TEE: - Left ventricle: Septal and apical hypokinesis. The cavity size was mildly dilated. Wall thickness was increased in a pattern of moderate LVH. Systolic function was mildly reduced. The estimated ejection fraction was in the range of 45% to 50%. Wall motion was normal; there were no regional wall motion abnormalities. Left ventricular diastolic function parameters were normal. - Aortic valve: Severely calcified non coronary cusp. - Mitral valve: Calcified annulus. Mildly thickened leaflets . - Atrial septum: No defect or patent foramen ovale was identified.  09-05-15: chest x-ray: No acute cardiopulmonary disease.    LABS REVIEWED:   08-31-15: wbc 10.7; hgb 8.7; hct 27.5; mcv 86.8; plt 453; glucose 163; bun 32; creat 1.42; k+4.1; na++132; liver normal albumin 2.6 08-26-15: wbc 9.1; hgb 8.9; hct 28.5; mcv 86.6; plt 572; glucose 140; bun 26; creat 1.18; k+ 4.4; na++132 09-01-15: wbc 8.0; hgb 8.7; hct 27.6;mcv 87.9; plt 456; glucose 128; bun 33; creat 1.26; k+ 5.5; na++ 134 09-04-15: glucose 117; bun 24; creat 1.02; k+ 4.7; na++ 134  09-07-15: glucose 193; bun 20.9; creat 1.01; k+ 4.6; na++ 135 09-18-15: glucose 166; bun 10.4; creat 0.93; k+ 4.1; na++ 140   Review of Systems  Constitutional: Negative for malaise/fatigue.  Respiratory: Negative for cough and  shortness of breath.   Cardiovascular: Negative for chest pain, palpitations lower extremity edema   Gastrointestinal: Negative for abdominal pain, constipation and heartburn.  Musculoskeletal: Negative for back pain, joint pain and myalgias.  Skin: Negative.   Neurological: Negative for dizziness.  Psychiatric/Behavioral: The patient is not nervous/anxious.     Physical Exam  Constitutional: No distress.  Eyes: Conjunctivae are normal.  Neck: Neck supple. No JVD present. No thyromegaly present.  Cardiovascular: Normal rate, regular rhythm and intact distal pulses.   Respiratory: Effort normal and breath sounds normal. No respiratory distress. He has no wheezes.  GI: Soft. Bowel sounds are normal. He exhibits no distension. There is no tenderness.  Musculoskeletal: 2+ right  lower extremity edema   Able to move all extremities  Status post left bka  Lymphadenopathy:    He has no cervical adenopathy.  Neurological: He is alert.  Skin: Skin is warm and dry. He is not diaphoretic.  Left stump in ace wrap   Psychiatric: He has a normal mood and affect.     ASSESSMENT/ PLAN:  1. Right  lower extremity edema: will begin lasix 20 mg daily and will check bmp in one week; will monitor    2. Stage III CKD: bun 0.93; creat 0.93 will monitor   MD is aware of resident's narcotic use and is in agreement with current plan of care. We will attempt to wean resident as apropriate   Ok Edwards NP [facility] Adult Medicine  Contact [phone] Monday through Friday 8am- 5pm  After hours call [phone]

## 2015-09-26 ENCOUNTER — Encounter: Payer: Self-pay | Admitting: Adult Health

## 2015-09-26 ENCOUNTER — Non-Acute Institutional Stay (SKILLED_NURSING_FACILITY): Payer: Medicaid Other | Admitting: Adult Health

## 2015-09-26 DIAGNOSIS — N183 Chronic kidney disease, stage 3 unspecified: Secondary | ICD-10-CM

## 2015-09-26 DIAGNOSIS — R600 Localized edema: Secondary | ICD-10-CM | POA: Diagnosis not present

## 2015-09-26 NOTE — Progress Notes (Signed)
Patient ID: Cory Weber, male   DOB: Dec 31, 1950, 65 y.o.   MRN: [id number]   Location:   [place] Room Number: 223-B Place of Service:  SNF (31)   CODE STATUS: Full Code  No Known Allergies  Chief Complaint  Patient presents with  . Acute Visit    Edema    HPI:  He is concerned that his edema is not getting better. He tells me that he is elevating his leg; but the nursing staff reports differently. He is not complaining of shortness of breath or chest pain.    Past Medical History:  Diagnosis Date  . Anemia   . Asthma   . Cellulitis and abscess of foot 07/2015  . CVA (cerebral vascular accident) ([place]) 2015   denies residual on 08/15/2015  . Hepatitis C    states he was diagnosed in 2007 or 2007 while living in [place], [place]  . Hyperlipidemia   . Hypertension   . Peripheral neuropathy ([place])   . Pneumonia X 1  . PVD (peripheral vascular disease) ([place])   . Type II diabetes mellitus ([place])     Past Surgical History:  Procedure Laterality Date  . AMPUTATION Left 08/16/2015   Procedure: LEFT BELOW THE KNEE AMPUTATION ;  Surgeon: Serafina Mitchell, MD;  Location: [place];  Service: Vascular;  Laterality: Left;  . BACK SURGERY    . BIOPSY N/A 09/03/2012   Procedure: BIOPSY;  Surgeon: Daneil Dolin, MD;  Location: [facility];  Service: Endoscopy;  Laterality: N/A;  gastric and gastric mucosa  . BIOPSY N/A 12/03/2012   Procedure: BIOPSY;  Surgeon: Daneil Dolin, MD;  [facility];  Service: Endoscopy;  Laterality: N/A;  . COLONOSCOPY WITH PROPOFOL N/A 09/03/2012   [id number] polyp-removed as outlined above. Prominent internal hemorrhoids. Tubular adenoma  . ESOPHAGOGASTRODUODENOSCOPY (EGD) WITH PROPOFOL N/A 09/03/2012   [id number] hernia. Gastric diverticulum. Gastric ulcers with associated erosions. Duodenal erosions. Status post gastric biopsy. H.PYLORI gastritis   . ESOPHAGOGASTRODUODENOSCOPY (EGD) WITH PROPOFOL N/A 12/03/2012   Dr. Gala Romney: gastric diverticulum,  gastric erosions and scar. Previously noted gastric ulcer completed healed. Biopsy without H.pylori.   Marland Kitchen LIVER BIOPSY  2005   Done in [place], [place]. Chronic hepatitis with mild periportal inflammation, lobular unicellular necrosis and portal fibrosis. Grade 2, stage 1-2.  Marland Kitchen MAXIMUM ACCESS (MAS)POSTERIOR LUMBAR INTERBODY FUSION (PLIF) 1 LEVEL N/A 11/25/2013   Procedure: FOR MAXIMUM ACCESS (MAS) POSTERIOR LUMBAR INTERBODY FUSION (PLIF) 1 LEVEL;  Surgeon: Eustace Moore, MD;  Location: [place] [facility];  Service: Neurosurgery;  Laterality: N/A;  FOR MAXIMUM ACCESS (MAS) POSTERIOR LUMBAR INTERBODY FUSION (PLIF) 1 LEVEL LUMBAR 3-4  . PERIPHERAL VASCULAR CATHETERIZATION Left 08/01/2015   Procedure: Lower Extremity Angiography;  Surgeon: Serafina Mitchell, MD;  Location: [place] [facility];  Service: Cardiovascular;  Laterality: Left;  . PERIPHERAL VASCULAR CATHETERIZATION N/A 08/01/2015   Procedure: Abdominal Aortogram;  Surgeon: Serafina Mitchell, MD;  Location: [place] [facility];  Service: Cardiovascular;  Laterality: N/A;  . PERIPHERAL VASCULAR CATHETERIZATION N/A 08/08/2015   Procedure: Abdominal Aortogram w/Lower Extremity;  Surgeon: Serafina Mitchell, MD;  Location: [place] [facility];  Service: Cardiovascular;  Laterality: N/A;  . PERIPHERAL VASCULAR CATHETERIZATION Left 08/08/2015   Procedure: Peripheral Vascular Balloon Angioplasty;  Surgeon: Serafina Mitchell, MD;  Location: [place] [facility];  Service: Cardiovascular;  Laterality: Left;  left popiteal artery, left peronealtrunk, left post tibial  . POLYPECTOMY N/A 09/03/2012   Procedure: POLYPECTOMY;  Surgeon: Daneil Dolin, MD;  [facility];  Service: Endoscopy;  Laterality: N/A;  cecal polyp  . TONSILLECTOMY      Social History   Social History  . Marital status: Divorced    Spouse name: N/A  . Number of children: 1  . Years of education: N/A   Occupational History  . trying to get disability    Social History Main Topics  . Smoking  status: Former Smoker    Packs/day: 1.00    Years: 47.00    Types: Cigarettes    Quit date: 07/13/2015  . Smokeless tobacco: Never Used  . Alcohol use 0.0 oz/week     Comment: 08/15/2015 "stopped drinking 3-4 years ago"  . Drug use:     Types: Cocaine     Comment: 08/15/2015 "last used cocaine 3-4 months ago"  . Sexual activity: No   Other Topics Concern  . Not on file   Social History Narrative  . No narrative on file   Family History  Problem Relation Age of Onset  . Breast cancer Mother     deceased  . Cancer Mother   . Heart failure Mother   . Diabetes Father   . Hypertension Father   . Heart disease Father     deceased  . Hyperlipidemia Father   . Diabetes Sister   . Hypertension Sister   . Breast cancer Sister   . Colon cancer Neg Hx   . Liver disease Neg Hx       VITAL SIGNS BP (!) 149/62   Pulse 87   Temp 98 F (36.7 C) (Oral)   Resp 18   Ht 5\' 10"  (1.778 m)   Wt 195 lb (88.5 kg)   SpO2 97%   BMI 27.98 kg/m   Patient's Medications  New Prescriptions   No medications on file  Previous Medications   ACIDOPHILUS (RISAQUAD) CAPS CAPSULE    Take 2 capsules by mouth daily.   ASPIRIN EC 81 MG TABLET    Take 1 tablet (81 mg total) by mouth daily.   CARVEDILOL (COREG) 3.125 MG TABLET    Take 1 tablet (3.125 mg total) by mouth 2 (two) times daily with a meal.   FAMOTIDINE (PEPCID) 20 MG TABLET    Take 1 tablet (20 mg total) by mouth daily.   FOLIC ACID (FOLVITE) 1 MG TABLET    Take 1 tablet (1 mg total) by mouth daily.   GABAPENTIN (NEURONTIN) 300 MG CAPSULE    Take 1 capsule (300 mg total) by mouth 3 (three) times daily.   INSULIN ASPART (NOVOLOG) 100 UNIT/ML INJECTION    Inject 5 Units into the skin 3 (three) times daily with meals.   INSULIN GLARGINE (LANTUS) 100 UNIT/ML INJECTION    Inject 0.17 mLs (17 Units total) into the skin daily.   MULTIPLE VITAMIN (MULTIVITAMIN WITH MINERALS) TABS TABLET    Take 1 tablet by mouth daily.   OXYCODONE-ACETAMINOPHEN  (PERCOCET/ROXICET) 5-325 MG TABLET    Take 1-2 tablets by mouth every 6 (six) hours as needed for severe pain.   POLYETHYLENE GLYCOL (MIRALAX / GLYCOLAX) PACKET    Take 17 g by mouth daily.   SENNA-DOCUSATE (SENOKOT-S) 8.6-50 MG TABLET    Take 2 tablets by mouth 2 (two) times daily.   SIMVASTATIN (ZOCOR) 20 MG TABLET    Take 20 mg by mouth daily at 6 PM.   TAMSULOSIN (FLOMAX) 0.4 MG CAPS CAPSULE    Take 1 capsule (0.4 mg  total) by mouth daily.   THIAMINE 100 MG TABLET    Take 1 tablet (100 mg total) by mouth daily.   TORSEMIDE (DEMADEX) 10 MG TABLET    Take 10 mg by mouth daily.  Modified Medications   No medications on file  Discontinued Medications   No medications on file     SIGNIFICANT DIAGNOSTIC EXAMS  08-03-15: TEE: - Left ventricle: Septal and apical hypokinesis. The cavity size was mildly dilated. Wall thickness was increased in a pattern of moderate LVH. Systolic function was mildly reduced. The estimated ejection fraction was in the range of 45% to 50%. Wall motion was normal; there were no regional wall motion abnormalities. Left ventricular diastolic function parameters were normal. - Aortic valve: Severely calcified non coronary cusp. - Mitral valve: Calcified annulus. Mildly thickened leaflets . - Atrial septum: No defect or patent foramen ovale was identified.  09-05-15: chest x-ray: No acute cardiopulmonary disease.    LABS REVIEWED:   08-31-15: wbc 10.7; hgb 8.7; hct 27.5; mcv 86.8; plt 453; glucose 163; bun 32; creat 1.42; k+4.1; na++132; liver normal albumin 2.6 08-26-15: wbc 9.1; hgb 8.9; hct 28.5; mcv 86.6; plt 572; glucose 140; bun 26; creat 1.18; k+ 4.4; na++132 09-01-15: wbc 8.0; hgb 8.7; hct 27.6;mcv 87.9; plt 456; glucose 128; bun 33; creat 1.26; k+ 5.5; na++ 134 09-04-15: glucose 117; bun 24; creat 1.02; k+ 4.7; na++ 134  09-18-15: glucose 166; bun 10.4; creat 0.93; k+ 4.1; na++ 140   Review of Systems  Constitutional: Negative for malaise/fatigue.  Respiratory:  Negative for cough and shortness of breath.   Cardiovascular: Negative for chest pain, palpitations has right lower extremity edema  Gastrointestinal: Negative for abdominal pain, constipation and heartburn.  Musculoskeletal: Negative for back pain, joint pain and myalgias.  Skin: Negative.   Neurological: Negative for dizziness.  Psychiatric/Behavioral: The patient is not nervous/anxious.     Physical Exam  Constitutional: No distress.  Eyes: Conjunctivae are normal.  Neck: Neck supple. No JVD present. No thyromegaly present.  Cardiovascular: Normal rate, regular rhythm and intact distal pulses.   Respiratory: Effort normal and breath sounds normal. No respiratory distress. He has no wheezes.  GI: Soft. Bowel sounds are normal. He exhibits no distension. There is no tenderness.  Musculoskeletal: 2+ right lower extremity edema   Able to move all extremities  Status post left bka  Lymphadenopathy:    He has no cervical adenopathy.  Neurological: He is alert.  Skin: Skin is warm and dry. He is not diaphoretic.  Left stump in ace wrap   Psychiatric: He has a normal mood and affect.     ASSESSMENT/ PLAN:  1. Right lower extremity edema: will increase his demadex to 20 mg daily; I have encouraged him to wear TED hose as well. Will monitor his status.   CKD stage III: bun 10.4; creat 0.93; will monitor    MD is aware of resident's narcotic use and is in agreement with current plan of care. We will attempt to wean resident as apropriate   Ok Edwards NP [facility] Adult Medicine  Contact [phone] Monday through Friday 8am- 5pm  After hours call [phone]

## 2015-10-02 ENCOUNTER — Encounter: Payer: Self-pay | Admitting: Internal Medicine

## 2015-10-02 ENCOUNTER — Non-Acute Institutional Stay (SKILLED_NURSING_FACILITY): Payer: Medicaid Other | Admitting: Internal Medicine

## 2015-10-02 DIAGNOSIS — R2233 Localized swelling, mass and lump, upper limb, bilateral: Secondary | ICD-10-CM

## 2015-10-02 DIAGNOSIS — E1159 Type 2 diabetes mellitus with other circulatory complications: Secondary | ICD-10-CM

## 2015-10-02 DIAGNOSIS — B182 Chronic viral hepatitis C: Secondary | ICD-10-CM

## 2015-10-02 NOTE — Progress Notes (Signed)
Patient ID: Cory Weber, male   DOB: 1950/06/18, 65 y.o.   MRN: [id number]    DATE: 10/02/15  Location:    [facility]   Place of Service: SN[facility] (31)   Extended Emergency Contact Information Primary Emergency Contact: Tye Savoy, [place] [address] [place] Mobile Phone: [phone] Relation: Sister Secondary Emergency Contact: Kerrin Mo, [place] [address] [place] [employer] Phone: [phone] Relation: Sister  Advanced Directive information  FULL CODE  Chief Complaint  Patient presents with  . Acute Visit    skin lumps    HPI:  65 yo male seen today for c/o skin lumps. He noticed them a few days ago. (+) pain. No hx skin cancer, lymphoma. He does have a hx untreated Hep C. He is s/p left BKA due to infected foot ulcer and PAD. No falls or other trauma. He has been participating with therapy  Hypertension/RLE edema - stable on coreg 3.125 mg twice daily and  demadex to 20 mg daily  DM - currently on novolog 5 units with meals and lantus 17 units nightly   S/p Left BKA due to infected left foot ulcer with PAD - tolerating tx. Pain controlled with percocet 5/325 mg 1 or 2 tabs every 6 hours as needed. Followed by Ortho/vascular sx. Will see Dr Trula Slade soon. Takes ASA daily and gabapentin  PUD - stable on pepcid 20 mg daily   Chronic hepatitis C - no known tx. LFTs stable (ALT 15; AST 21 last month)  Phantom limb pain - stable on neurontin 300 mg three times daily   Hx CKD stage III - resolved. Cr 0.9  BPH - sx's stable on flomax daily   Hx CVA -  Stable. Takes asa 81 mg daily   ETOH abuse - takes folic acid and thiamine  Constipation - stable on miralax daily and senna s 2 tabs twice daily   He gets nutritional supplements per facility protocol   Past Medical History:  Diagnosis Date  . Anemia   . Asthma   . Cellulitis and abscess of foot 07/2015  . CVA (cerebral vascular accident) ([place]) 2015   denies  residual on 08/15/2015  . Hepatitis C    states he was diagnosed in 2007 or 2007 while living in [place], [place]  . Hyperlipidemia   . Hypertension   . Peripheral neuropathy ([facility])   . Pneumonia X 1  . PVD (peripheral vascular disease) ([facility])   . Type II diabetes mellitus ([place])     Past Surgical History:  Procedure Laterality Date  . AMPUTATION Left 08/16/2015   Procedure: LEFT BELOW THE KNEE AMPUTATION ;  Surgeon: Serafina Mitchell, MD;  Location: [facility];  Service: Vascular;  Laterality: Left;  . BACK SURGERY    . BIOPSY N/A 09/03/2012   Procedure: BIOPSY;  Surgeon: Daneil Dolin, MD;  Location: [facility];  Service: Endoscopy;  Laterality: N/A;  gastric and gastric mucosa  . BIOPSY N/A 12/03/2012   Procedure: BIOPSY;  Surgeon: Daneil Dolin, MD;  Location: [facility];  Service: Endoscopy;  Laterality: N/A;  . COLONOSCOPY WITH PROPOFOL N/A 09/03/2012   JSE:GBTDVVO polyp-removed as outlined above. Prominent internal hemorrhoids. Tubular adenoma  . ESOPHAGOGASTRODUODENOSCOPY (EGD) WITH PROPOFOL N/A 09/03/2012   HYW:VPXTGG hernia. Gastric diverticulum. Gastric ulcers with associated erosions. Duodenal erosions. Status post gastric biopsy. H.PYLORI gastritis   . ESOPHAGOGASTRODUODENOSCOPY (EGD) WITH PROPOFOL  N/A 12/03/2012   Dr. Gala Romney: gastric diverticulum, gastric erosions and scar. Previously noted gastric ulcer completed healed. Biopsy without H.pylori.   Marland Kitchen LIVER BIOPSY  2005   Done in [place], [place]. Chronic hepatitis with mild periportal inflammation, lobular unicellular necrosis and portal fibrosis. Grade 2, stage 1-2.  Marland Kitchen MAXIMUM ACCESS (MAS)POSTERIOR LUMBAR INTERBODY FUSION (PLIF) 1 LEVEL N/A 11/25/2013   Procedure: FOR MAXIMUM ACCESS (MAS) POSTERIOR LUMBAR INTERBODY FUSION (PLIF) 1 LEVEL;  Surgeon: Eustace Moore, MD;  Location: [facility];  Service: Neurosurgery;  Laterality: N/A;  FOR MAXIMUM ACCESS (MAS) POSTERIOR LUMBAR INTERBODY FUSION (PLIF) 1 LEVEL LUMBAR 3-4  . PERIPHERAL VASCULAR  CATHETERIZATION Left 08/01/2015   Procedure: Lower Extremity Angiography;  Surgeon: Serafina Mitchell, MD;  Location: [facility];  Service: Cardiovascular;  Laterality: Left;  . PERIPHERAL VASCULAR CATHETERIZATION N/A 08/01/2015   Procedure: Abdominal Aortogram;  Surgeon: Serafina Mitchell, MD;  Location: [facility];  Service: Cardiovascular;  Laterality: N/A;  . PERIPHERAL VASCULAR CATHETERIZATION N/A 08/08/2015   Procedure: Abdominal Aortogram w/Lower Extremity;  Surgeon: Serafina Mitchell, MD;  Location: [facility];  Service: Cardiovascular;  Laterality: N/A;  . PERIPHERAL VASCULAR CATHETERIZATION Left 08/08/2015   Procedure: Peripheral Vascular Balloon Angioplasty;  Surgeon: Serafina Mitchell, MD;  Location: [facility];  Service: Cardiovascular;  Laterality: Left;  left popiteal artery, left peronealtrunk, left post tibial  . POLYPECTOMY N/A 09/03/2012   Procedure: POLYPECTOMY;  Surgeon: Daneil Dolin, MD;  Location: [facility];  Service: Endoscopy;  Laterality: N/A;  cecal polyp  . TONSILLECTOMY      Patient Care Team: Iona Beard, MD as PCP - General (Family Medicine) Daneil Dolin, MD as Consulting Physician (Gastroenterology) Herminio Commons, MD as Attending Physician (Cardiology)  Social History   Social History  . Marital status: Divorced    Spouse name: N/A  . Number of children: 1  . Years of education: N/A   Occupational History  . trying to get disability    Social History Main Topics  . Smoking status: Former Smoker    Packs/day: 1.00    Years: 47.00    Types: Cigarettes    Quit date: 07/13/2015  . Smokeless tobacco: Never Used  . Alcohol use 0.0 oz/week     Comment: 08/15/2015 "stopped drinking 3-4 years ago"  . Drug use:     Types: Cocaine     Comment: 08/15/2015 "last used cocaine 3-4 months ago"  . Sexual activity: No   Other Topics Concern  . Not on file   Social History Narrative  . No narrative on file     reports that he quit  smoking about 2 months ago. His smoking use included Cigarettes. He has a 47.00 pack-year smoking history. He has never used smokeless tobacco. He reports that he drinks alcohol. He reports that he uses drugs, including Cocaine.  Family History  Problem Relation Age of Onset  . Breast cancer Mother     deceased  . Cancer Mother   . Heart failure Mother   . Diabetes Father   . Hypertension Father   . Heart disease Father     deceased  . Hyperlipidemia Father   . Diabetes Sister   . Hypertension Sister   . Breast cancer Sister   . Colon cancer Neg Hx   . Liver disease Neg Hx    Family Status  Relation Status  . Mother   . Father   .  Sister   . Sister   . Neg Hx     Immunization History  Administered Date(s) Administered  . Influenza,inj,Quad PF,36+ Mos 11/24/2013  . PPD Test 09/20/2015  . Pneumococcal Polysaccharide-23 11/24/2013    No Known Allergies  Medications: Patient's Medications  New Prescriptions   No medications on file  Previous Medications   ACIDOPHILUS (RISAQUAD) CAPS CAPSULE    Take 2 capsules by mouth daily.   ASPIRIN EC 81 MG TABLET    Take 1 tablet (81 mg total) by mouth daily.   CARVEDILOL (COREG) 3.125 MG TABLET    Take 1 tablet (3.125 mg total) by mouth 2 (two) times daily with a meal.   FAMOTIDINE (PEPCID) 20 MG TABLET    Take 1 tablet (20 mg total) by mouth daily.   FOLIC ACID (FOLVITE) 1 MG TABLET    Take 1 tablet (1 mg total) by mouth daily.   GABAPENTIN (NEURONTIN) 300 MG CAPSULE    Take 1 capsule (300 mg total) by mouth 3 (three) times daily.   INSULIN ASPART (NOVOLOG) 100 UNIT/ML INJECTION    Inject 5 Units into the skin 3 (three) times daily with meals.   INSULIN GLARGINE (LANTUS) 100 UNIT/ML INJECTION    Inject 0.17 mLs (17 Units total) into the skin daily.   MULTIPLE VITAMIN (MULTIVITAMIN WITH MINERALS) TABS TABLET    Take 1 tablet by mouth daily.   OXYCODONE-ACETAMINOPHEN (PERCOCET/ROXICET) 5-325 MG TABLET    Take 1-2 tablets by mouth  every 6 (six) hours as needed for severe pain.   POLYETHYLENE GLYCOL (MIRALAX / GLYCOLAX) PACKET    Take 17 g by mouth daily.   SENNA-DOCUSATE (SENOKOT-S) 8.6-50 MG TABLET    Take 2 tablets by mouth 2 (two) times daily.   SIMVASTATIN (ZOCOR) 20 MG TABLET    Take 20 mg by mouth daily at 6 PM.   TAMSULOSIN (FLOMAX) 0.4 MG CAPS CAPSULE    Take 1 capsule (0.4 mg total) by mouth daily.   THIAMINE 100 MG TABLET    Take 1 tablet (100 mg total) by mouth daily.   TORSEMIDE (DEMADEX) 10 MG TABLET    Take 10 mg by mouth daily.  Modified Medications   No medications on file  Discontinued Medications   No medications on file    Review of Systems  Cardiovascular: Positive for leg swelling.  Musculoskeletal: Positive for arthralgias and gait problem.  All other systems reviewed and are negative.   Vitals:   10/02/15 1232  BP: (!) 158/78  Pulse: 88  Temp: 98 F (36.7 C)  SpO2: 97%  Weight: 193 lb (87.5 kg)   Body mass index is 27.69 kg/m.  Physical Exam  Constitutional: He appears well-developed and well-nourished.  Cardiovascular:  +1 pitting RLE edema. No right calf TTP  Musculoskeletal: He exhibits edema and tenderness.  Left BKA  Neurological: He is alert.  Skin: No rash noted.  Right anterior shoulder orange sized freely mobile subcut lump, TTP. No redness or d/c. Similar smaller immobile lump left biceps, NT     Labs reviewed: Nursing Home on 09/26/2015  Component Date Value Ref Range Status  . Glucose 09/18/2015 166  mg/dL Final  . BUN 09/18/2015 10  4 - 21 mg/dL Final  . Creatinine 09/18/2015 0.9  0.6 - 1.3 mg/dL Final  . Potassium 09/18/2015 4.1  3.4 - 5.3 mmol/L Final  . Sodium 09/18/2015 140  137 - 147 mmol/L Final  Nursing Home on 09/07/2015  Component Date Value Ref Range  Status  . WBC 09/01/2015 8.1  10^3/mL Final  . Glucose 09/01/2015 128  mg/dL Final  . BUN 09/01/2015 33* 4 - 21 mg/dL Final  . Creatinine 09/01/2015 1.3  0.6 - 1.3 mg/dL Final  . Potassium  09/01/2015 5.5* 3.4 - 5.3 mmol/L Final  . Sodium 09/01/2015 134* 137 - 147 mmol/L Final  . Hemoglobin 08/30/2015 8.8* 13.5 - 17.5 g/dL Final  . HCT 08/30/2015 28* 41 - 53 % Final  . Platelets 08/30/2015 494* 150 - 399 K/L Final  . WBC 08/30/2015 7.3  10^3/mL Final  . Glucose 09/02/2015 115  mg/dL Final  . BUN 09/02/2015 27* 4 - 21 mg/dL Final  . Creatinine 09/02/2015 1.2  0.6 - 1.3 mg/dL Final  . Potassium 09/02/2015 5.3  3.4 - 5.3 mmol/L Final  . Sodium 09/02/2015 136* 137 - 147 mmol/L Final  . Glucose 09/04/2015 117  mg/dL Final  . BUN 09/04/2015 24* 4 - 21 mg/dL Final  . Creatinine 09/04/2015 1.0  0.6 - 1.3 mg/dL Final  . Sodium 09/04/2015 134* 137 - 147 mmol/L Final  Admission on 08/21/2015, Discharged on 09/05/2015  No results displayed because visit has over 200 results.    Admission on 08/14/2015, Discharged on 08/21/2015  No results displayed because visit has over 200 results.    Admission on 07/23/2015, Discharged on 08/10/2015  No results displayed because visit has over 200 results.    Admission on 07/18/2015, Discharged on 07/20/2015  Component Date Value Ref Range Status  . WBC 07/18/2015 13.0* 4.0 - 10.5 K/uL Final  . RBC 07/18/2015 2.85* 4.22 - 5.81 MIL/uL Final  . Hemoglobin 07/18/2015 7.7* 13.0 - 17.0 g/dL Final  . HCT 07/18/2015 24.9* 39.0 - 52.0 % Final  . MCV 07/18/2015 87.4  78.0 - 100.0 fL Final  . MCH 07/18/2015 27.0  26.0 - 34.0 pg Final  . MCHC 07/18/2015 30.9  30.0 - 36.0 g/dL Final  . RDW 07/18/2015 13.9  11.5 - 15.5 % Final  . Platelets 07/18/2015 414* 150 - 400 K/uL Final  . Neutrophils Relative % 07/18/2015 80  % Final  . Neutro Abs 07/18/2015 10.3* 1.7 - 7.7 K/uL Final  . Lymphocytes Relative 07/18/2015 14  % Final  . Lymphs Abs 07/18/2015 1.8  0.7 - 4.0 K/uL Final  . Monocytes Relative 07/18/2015 6  % Final  . Monocytes Absolute 07/18/2015 0.8  0.1 - 1.0 K/uL Final  . Eosinophils Relative 07/18/2015 0  % Final  . Eosinophils Absolute  07/18/2015 0.0  0.0 - 0.7 K/uL Final  . Basophils Relative 07/18/2015 0  % Final  . Basophils Absolute 07/18/2015 0.0  0.0 - 0.1 K/uL Final  . Sodium 07/18/2015 134* 135 - 145 mmol/L Final  . Potassium 07/18/2015 4.6  3.5 - 5.1 mmol/L Final  . Chloride 07/18/2015 102  101 - 111 mmol/L Final  . CO2 07/18/2015 26  22 - 32 mmol/L Final  . Glucose, Bld 07/18/2015 318* 65 - 99 mg/dL Final  . BUN 07/18/2015 29* 6 - 20 mg/dL Final  . Creatinine, Ser 07/18/2015 1.43* 0.61 - 1.24 mg/dL Final  . Calcium 07/18/2015 9.4  8.9 - 10.3 mg/dL Final  . Total Protein 07/18/2015 8.8* 6.5 - 8.1 g/dL Final  . Albumin 07/18/2015 3.6  3.5 - 5.0 g/dL Final  . AST 07/18/2015 18  15 - 41 U/L Final  . ALT 07/18/2015 14* 17 - 63 U/L Final  . Alkaline Phosphatase 07/18/2015 51  38 - 126 U/L Final  .  Total Bilirubin 07/18/2015 0.3  0.3 - 1.2 mg/dL Final  . GFR calc non Af Amer 07/18/2015 51* >60 mL/min Final  . GFR calc Af Amer 07/18/2015 59* >60 mL/min Final   Comment: (NOTE) The eGFR has been calculated using the CKD EPI equation. This calculation has not been validated in all clinical situations. eGFR's persistently <60 mL/min signify possible Chronic Kidney Disease.   . Anion gap 07/18/2015 6  5 - 15 Final  . Specimen Description 07/23/2015 BLOOD LEFT ANTECUBITAL   Final  . Special Requests 07/23/2015 BOTTLES DRAWN AEROBIC AND ANAEROBIC 10CC EACH   Final  . Culture 07/23/2015 NO GROWTH 5 DAYS   Final  . Report Status 07/23/2015 07/23/2015 FINAL   Final  . Specimen Description 07/23/2015 BLOOD LEFT ARM DRAWN BY RN   Final  . Special Requests 07/23/2015 BOTTLES DRAWN AEROBIC AND ANAEROBIC 5CC EACH   Final  . Culture 07/23/2015 NO GROWTH 5 DAYS   Final  . Report Status 07/23/2015 07/23/2015 FINAL   Final  . Lactic Acid, Venous 07/18/2015 2.74* 0.5 - 2.0 mmol/L Final  . ABO/RH(D) 07/19/2015 O POS   Final  . Antibody Screen 07/19/2015 NEG   Final  . Sample Expiration 07/19/2015 07/21/2015   Final  . Unit  Number 07/19/2015 [id number]   Final  . Blood Component Type 07/19/2015 RED CELLS,LR   Final  . Unit division 07/19/2015 00   Final  . Status of Unit 07/19/2015 ISSUED,FINAL   Final  . Transfusion Status 07/19/2015 OK TO TRANSFUSE   Final  . Crossmatch Result 07/19/2015 Compatible   Final  . Order Confirmation 07/18/2015 ORDER PROCESSED BY BLOOD BANK   Final  . Lactic Acid, Venous 07/18/2015 2.14* 0.5 - 2.0 mmol/L Final  . Glucose-Capillary 07/18/2015 207* 65 - 99 mg/dL Final  . Glucose-Capillary 07/18/2015 82  65 - 99 mg/dL Final  . WBC 07/19/2015 12.5* 4.0 - 10.5 K/uL Final  . RBC 07/19/2015 3.24* 4.22 - 5.81 MIL/uL Final  . Hemoglobin 07/19/2015 9.2* 13.0 - 17.0 g/dL Final  . HCT 07/19/2015 28.3* 39.0 - 52.0 % Final  . MCV 07/19/2015 87.3  78.0 - 100.0 fL Final  . MCH 07/19/2015 28.4  26.0 - 34.0 pg Final  . MCHC 07/19/2015 32.5  30.0 - 36.0 g/dL Final  . RDW 07/19/2015 13.9  11.5 - 15.5 % Final  . Platelets 07/19/2015 370  150 - 400 K/uL Final  . Sodium 07/19/2015 135  135 - 145 mmol/L Final  . Potassium 07/19/2015 4.3  3.5 - 5.1 mmol/L Final  . Chloride 07/19/2015 102  101 - 111 mmol/L Final  . CO2 07/19/2015 26  22 - 32 mmol/L Final  . Glucose, Bld 07/19/2015 152* 65 - 99 mg/dL Final  . BUN 07/19/2015 19  6 - 20 mg/dL Final  . Creatinine, Ser 07/19/2015 1.32* 0.61 - 1.24 mg/dL Final  . Calcium 07/19/2015 9.0  8.9 - 10.3 mg/dL Final  . GFR calc non Af Amer 07/19/2015 56* >60 mL/min Final  . GFR calc Af Amer 07/19/2015 >60  >60 mL/min Final   Comment: (NOTE) The eGFR has been calculated using the CKD EPI equation. This calculation has not been validated in all clinical situations. eGFR's persistently <60 mL/min signify possible Chronic Kidney Disease.   . Anion gap 07/19/2015 7  5 - 15 Final  . Glucose-Capillary 07/18/2015 252* 65 - 99 mg/dL Final  . Comment 1 07/18/2015 Notify RN   Final  . Comment 2 07/18/2015 Document in Chart  Final  . Glucose-Capillary 07/18/2015  256* 65 - 99 mg/dL Final  . Comment 1 07/18/2015 Notify RN   Final  . Comment 2 07/18/2015 Document in Chart   Final  . Glucose-Capillary 07/19/2015 157* 65 - 99 mg/dL Final  . Comment 1 07/19/2015 Notify RN   Final  . Fecal Occult Bld 07/19/2015 POSITIVE* NEGATIVE Final  . Glucose-Capillary 07/19/2015 79  65 - 99 mg/dL Final  . Glucose-Capillary 07/19/2015 130* 65 - 99 mg/dL Final  . Comment 1 07/19/2015 Notify RN   Final  . Comment 2 07/19/2015 Document in Chart   Final  . Glucose-Capillary 07/19/2015 233* 65 - 99 mg/dL Final  . Comment 1 07/19/2015 Notify RN   Final  . Comment 2 07/19/2015 Document in Chart   Final  . Glucose-Capillary 07/20/2015 160* 65 - 99 mg/dL Final  . Glucose-Capillary 07/20/2015 45* 65 - 99 mg/dL Final  . Comment 1 07/20/2015 Notify RN   Final  . Glucose-Capillary 07/20/2015 98  65 - 99 mg/dL Final    Dg Chest 2 View  Result Date: 09/05/2015 CLINICAL DATA:  Patient to transfer today out of Rehabil[facility]. EXAM: CHEST  2 VIEW COMPARISON:  04/01/2014 FINDINGS: Lungs are adequately inflated without consolidation or effusion. Cardiomediastinal silhouette is within normal mild prominence of the main pulmonary arteries. Minimal degenerate change of the spine. IMPRESSION: No acute cardiopulmonary disease. Electronically Signed   By: Marin Olp M.D.   On: 09/05/2015 12:29    Assessment/Plan   ICD-9-CM ICD-10-CM   1. Skin lump of arm, bilateral 782.2 R22.33   2. Type 2 diabetes mellitus with vascular disease (HCC) 250.70 E11.59    443.81    3. Chronic hepatitis C without hepatic coma (HCC) 070.54 B18.2     Check UE soft tissue US to further eval masses  May need surgical eval  Cont current meds as ordered  Will follow  Leora Platt S. Perlie Gold  [facility] [address] [place], [place] [address] [phone] Cell (Monday-Friday 8 AM - 5 PM) [phone] After 5 PM and follow prompts

## 2015-10-06 ENCOUNTER — Non-Acute Institutional Stay (SKILLED_NURSING_FACILITY): Payer: Medicaid Other | Admitting: Internal Medicine

## 2015-10-06 ENCOUNTER — Encounter: Payer: Self-pay | Admitting: Internal Medicine

## 2015-10-06 DIAGNOSIS — R52 Pain, unspecified: Secondary | ICD-10-CM

## 2015-10-06 DIAGNOSIS — E1159 Type 2 diabetes mellitus with other circulatory complications: Secondary | ICD-10-CM | POA: Diagnosis not present

## 2015-10-06 DIAGNOSIS — I10 Essential (primary) hypertension: Secondary | ICD-10-CM

## 2015-10-06 NOTE — Progress Notes (Signed)
Patient ID: Cory Weber, male   DOB: [birth year], 65 y.o.   MRN: [id number]    Location:   [facility] Room Number: 223-B Place of Service:  SNF (31) Provider:  Granville Lewis, PA-C  Maggie Font, MD  Patient Care Team: Iona Beard, MD as PCP - General (Family Medicine) Daneil Dolin, MD as Consulting Physician (Gastroenterology) Herminio Commons, MD as Attending Physician (Cardiology)  Extended Emergency Contact Information Primary Emergency Contact: Tye Savoy, [place] [address] [address] of [place] Phone: [phone] Relation: Sister Secondary Emergency Contact: Kerrin Mo, [place] [address] [address] of [place] Phone: [phone] Relation: Sister  Code Status:  Full Code Goals of care: Advanced Directive information Advanced Directives 09/18/2015  Does patient have an advance directive? No  Type of Advance Directive -  Would patient like information on creating an advanced directive? No - patient declined information  Pre-existing out of facility DNR order (yellow form or pink MOST form) -     Chief Complaint  Patient presents with  . Acute Visit    Pain  Also elevated CBG  HPI:  Pt is a 65 y.o. male seen today for an acute visit for evaluation of pain management-as well as elevated CBGs.  Patient does have a history of type 2 diabetes he is on NovoLog 3 units with meals as well as Lantus 17 units daily at bedtime.  It appears recent blood sugars run largely in the 2-300 range throughout most of the day.  This morning blood sugar was 238-at noon was 205 and at 5 PM was 366.  Yesterday morning blood sugar was 336-at noon was 348-at 5 PM was 186-and at at bedtime was 300.  Patient also has a history of pain with a history of peripheral vascular disease he is receiving Percocet 5-3 25 mg one tablet every 6 hours when necessary for moderate pain.  And 2 for severe pain.  He also is on Neurontin 600  mg 3 times a day.  He does not report any sedation with current medication and just feels it does not last long enough and would like more frequent dosing if possible.   Past Medical History:  Diagnosis Date  . Anemia   . Asthma   . Cellulitis and abscess of foot 07/2015  . CVA (cerebral vascular accident) ([facility]) 2015   denies residual on 08/15/2015  . Hepatitis C    states he was diagnosed in 2007 or 2007 while living in [place], [place]  . Hyperlipidemia   . Hypertension   . Peripheral neuropathy ([facility])   . Pneumonia X 1  . PVD (peripheral vascular disease) ([facility])   . Type II diabetes mellitus ([facility])    Past Surgical History:  Procedure Laterality Date  . AMPUTATION Left 08/16/2015   Procedure: LEFT BELOW THE KNEE AMPUTATION ;  Surgeon: Serafina Mitchell, MD;  Location: [facility];  Service: Vascular;  Laterality: Left;  . BACK SURGERY    . BIOPSY N/A 09/03/2012   Procedure: BIOPSY;  Surgeon: Daneil Dolin, MD;  Location: [facility];  Service: Endoscopy;  Laterality: N/A;  gastric and gastric mucosa  . BIOPSY N/A 12/03/2012   Procedure: BIOPSY;  Surgeon: Daneil Dolin, MD;  Location: [facility];  Service: Endoscopy;  Laterality: N/A;  . COLONOSCOPY WITH PROPOFOL N/A 09/03/2012   [id number] polyp-removed as outlined above. Prominent internal hemorrhoids. Tubular  adenoma  . ESOPHAGOGASTRODUODENOSCOPY (EGD) WITH PROPOFOL N/A 09/03/2012   [id number] hernia. Gastric diverticulum. Gastric ulcers with associated erosions. Duodenal erosions. Status post gastric biopsy. H.PYLORI gastritis   . ESOPHAGOGASTRODUODENOSCOPY (EGD) WITH PROPOFOL N/A 12/03/2012   Dr. Gala Romney: gastric diverticulum, gastric erosions and scar. Previously noted gastric ulcer completed healed. Biopsy without H.pylori.   [place] Kitchen LIVER BIOPSY  2005   Done in [place], [place]. Chronic hepatitis with mild periportal inflammation, lobular unicellular necrosis and portal fibrosis. Grade 2, stage 1-2.  [place] [facility] MAXIMUM ACCESS (MAS)POSTERIOR LUMBAR INTERBODY  FUSION (PLIF) 1 LEVEL N/A 11/25/2013   Procedure: FOR MAXIMUM ACCESS (MAS) POSTERIOR LUMBAR INTERBODY FUSION (PLIF) 1 LEVEL;  Surgeon: Eustace Moore, MD;  Location: [facility];  Service: Neurosurgery;  Laterality: N/A;  FOR MAXIMUM ACCESS (MAS) POSTERIOR LUMBAR INTERBODY FUSION (PLIF) 1 LEVEL LUMBAR 3-4  . PERIPHERAL VASCULAR CATHETERIZATION Left 08/01/2015   Procedure: Lower Extremity Angiography;  Surgeon: Serafina Mitchell, MD;  Location: [facility];  Service: Cardiovascular;  Laterality: Left;  . PERIPHERAL VASCULAR CATHETERIZATION N/A 08/01/2015   Procedure: Abdominal Aortogram;  Surgeon: Serafina Mitchell, MD;  Location: [facility];  Service: Cardiovascular;  Laterality: N/A;  . PERIPHERAL VASCULAR CATHETERIZATION N/A 08/08/2015   Procedure: Abdominal Aortogram w/Lower Extremity;  Surgeon: Serafina Mitchell, MD;  Location: [facility];  Service: Cardiovascular;  Laterality: N/A;  . PERIPHERAL VASCULAR CATHETERIZATION Left 08/08/2015   Procedure: Peripheral Vascular Balloon Angioplasty;  Surgeon: Serafina Mitchell, MD;  Location: [facility];  Service: Cardiovascular;  Laterality: Left;  left popiteal artery, left peronealtrunk, left post tibial  . POLYPECTOMY N/A 09/03/2012   Procedure: POLYPECTOMY;  Surgeon: Daneil Dolin, MD;  Location: [facility];  Service: Endoscopy;  Laterality: N/A;  cecal polyp  . TONSILLECTOMY      No Known Allergies    Medication List       Accurate as of 10/06/15  1:56 PM. Always use your most recent med list.          acidophilus Caps capsule Take 2 capsules by mouth daily.   aspirin EC 81 MG tablet Take 1 tablet (81 mg total) by mouth daily.   carvedilol 3.125 MG tablet Commonly known as:  COREG Take 1 tablet (3.125 mg total) by mouth 2 (two) times daily with a meal.   famotidine 20 MG tablet Commonly known as:  PEPCID Take 1 tablet (20 mg total) by mouth daily.   folic acid 1 MG tablet Commonly known as:  FOLVITE Take 1  tablet (1 mg total) by mouth daily.   gabapentin 300 MG capsule Commonly known as:  NEURONTIN Take 1 capsule (300 mg total) by mouth 3 (three) times daily.   insulin aspart 100 UNIT/ML injection Commonly known as:  novoLOG Inject 5 Units into the skin 3 (three) times daily with meals.   insulin glargine 100 UNIT/ML injection Commonly known as:  LANTUS Inject 0.17 mLs (17 Units total) into the skin daily.   multivitamin with minerals Tabs tablet Take 1 tablet by mouth daily.   oxyCODONE-acetaminophen 5-325 MG tablet Commonly known as:  PERCOCET/ROXICET Take 1-2 tablets by mouth every 6 (six) hours as needed for severe pain.   polyethylene glycol packet Commonly known as:  MIRALAX / GLYCOLAX Take 17 g by mouth daily.   senna-docusate 8.6-50 MG tablet Commonly known as:  Senokot-S Take 2 tablets by mouth 2 (two) times daily.   simvastatin 20 MG tablet Commonly known as:  ZOCOR  Take 20 mg by mouth daily at 6 PM.   tamsulosin 0.4 MG Caps capsule Commonly known as:  FLOMAX Take 1 capsule (0.4 mg total) by mouth daily.   thiamine 100 MG tablet Take 1 tablet (100 mg total) by mouth daily.   torsemide 10 MG tablet Commonly known as:  DEMADEX Take 10 mg by mouth daily.       Review of Systems   Constitutional: Negative for malaise/fatigue.  Respiratory: Negative for cough and shortness of breath.   Cardiovascular: Negative for chest pain, palpitations and leg swelling.  Gastrointestinal: Negative for abdominal pain, constipation and heartburn.  Musculoskeletal: Complains of somewhat diffuse pain-is status post left AKA Skin: Negative.   Neurological: Negative for dizziness.  Psychiatric/Behavioral: The patient is not nervous/anxious.   Immunization History  Administered Date(s) Administered  . Influenza,inj,Quad PF,36+ Mos 11/24/2013  . PPD Test 09/20/2015  . Pneumococcal Polysaccharide-23 11/24/2013   Pertinent  Health Maintenance Due  Topic Date Due  . FOOT  EXAM  10/30/1962  . OPHTHALMOLOGY EXAM  10/30/1962  . URINE MICROALBUMIN  10/30/1962  . INFLUENZA VACCINE  09/12/2015  . HEMOGLOBIN A1C  01/23/2016  . COLONOSCOPY  09/04/2022   Fall Risk  06/08/2015 02/22/2015  Falls in the past year? No Yes  Number falls in past yr: - 2 or more  Injury with Fall? - No  Risk Factor Category  - High Fall Risk  Risk for fall due to : - History of fall(s);Impaired balance/gait  Follow up - Falls evaluation completed   Functional Status Survey:    Vitals:   10/06/15 1351  BP: (!) 158/78  Pulse: 88  Resp: 20  Temp: 98 F (36.7 C)  TempSrc: Oral  SpO2: 97%  Weight: 193 lb 4 oz (87.7 kg)  Height: 5\' 10"  (1.778 m)   Body mass index is 27.73 kg/m. Physical Exam   Constitutional: No distress.  Eyes: Conjunctivae are normal.  Neck: Neck supple. No JVD present. No thyromegaly present.  Cardiovascular: Normal rate, regular rhythm and intact distal pulses.   Respiratory: Effort normal and breath sounds normal. No respiratory distress. He has no wheezes.  GI: Soft. Bowel sounds are normal. He exhibits no distension. There is no tenderness.  Musculoskeletal: Has some mild right ankle edema otherwise does not really have significant edema-    Able to move all extremities with some discomfort with flexion and extension of knees  bilaterally  Status post left bka     Neurological: He is alert. Lateralizing findings Skin: Skin is warm and dry. He is not diaphoretic.   does have 2 small lump appearing areas 1 in this upper right shoulder area and the other left upper arm-these are nontender nonerythematous a surgical consult is pending   Psychiatric: He has a normal mood and affect.  Labs reviewed:  Recent Labs  08/05/15 0339 08/06/15 0258  08/15/15 0615  09/01/15 0519  09/02/15 0617 09/04/15 09/04/15 0534 09/18/15  NA 137 136  < > 136  < > 134*  < > 136 134* 134* 140  K 4.3 4.4  < > 3.5  < > 5.5*  < > 5.3*  --  4.7 4.1  CL 101 101  < > 102   < > 101  --  102  --  101  --   CO2 30 28  < > 27  < > 28  --  28  --  28  --   GLUCOSE 110* 147*  < > 125*  < >  128*  --  115*  --  117*  --   BUN 11 12  < > 11  < > 33*  < > 27* 24* 24* 10  CREATININE 1.59* 1.64*  < > 1.10  < > 1.26*  < > 1.18 1.0 1.02 0.9  CALCIUM 9.2 9.0  < > 9.1  < > 9.7  --  9.7  --  9.5  --   MG  --   --   --  1.6*  --   --   --   --   --   --   --   PHOS 3.9 4.3  --  4.1  --   --   --   --   --   --   --   < > = values in this interval not displayed.  Recent Labs  08/14/15 2019 08/15/15 0615 08/21/15 0329  AST 17 13* 21  ALT 19 17 15*  ALKPHOS 71 51 49  BILITOT 0.2* 0.6 0.4  PROT 8.2* 7.8 8.4*  ALBUMIN 2.7* 2.5* 2.6*    Recent Labs  08/22/15 0917 08/25/15 0349 08/26/15 0504 08/30/15 08/30/15 0641 09/01/15 09/01/15 0519  WBC 11.0* 9.6 9.1 7.3 7.3 8.1 8.1  NEUTROABS 7.5 5.7 5.7  --   --   --   --   HGB 8.7* 8.4* 8.9* 8.8* 8.8*  --  8.7*  HCT 28.0* 27.2* 28.5* 28* 27.6*  --  27.6*  MCV 87.5 86.9 86.6  --  86.5  --  87.9  PLT 498* 489* 572* 494* 494*  --  456*   Lab Results  Component Value Date   TSH 2.687 08/15/2015   Lab Results  Component Value Date   HGBA1C 7.0 (H) 07/24/2015   Lab Results  Component Value Date   CHOL 127 11/24/2013   HDL 44 11/24/2013   LDLCALC 62 11/24/2013   TRIG 103 11/24/2013   CHOLHDL 2.9 11/24/2013    Significant Diagnostic Results in last 30 days:  No results found.  Assessment/Plan .  #1 pain management-this was discussed with patient-will discontinue the Percocet and start oxycodone 5 mg every 4 hours when necessary 1 tab for moderate pain and 2 for severe continue to monitor.  #2 history of elevated CBGs these appear to be fairly consistently throughout the day will increase his Lantus up to-0.2 units daily and continue to monitor.  #3  somewhat elevated blood pressure systolically of [id number] chart review this does not appear to be consistent I  see previous readings 0000000  systolics in the 0000000 and 150s but this is not persistent-at this point continue to monitor he is on Coreg 3.125 mg twice a day-   Of note Will update labs including a basic metabolic panel-as well as CBC next lab day.  #4-history of "lumps"-again a surgical consult is pending he does not appear to be in discomfort with these.  [id number]

## 2015-10-08 ENCOUNTER — Encounter: Payer: Self-pay | Admitting: Internal Medicine

## 2015-10-08 DIAGNOSIS — K219 Gastro-esophageal reflux disease without esophagitis: Secondary | ICD-10-CM | POA: Insufficient documentation

## 2015-10-11 ENCOUNTER — Other Ambulatory Visit: Payer: Self-pay

## 2015-10-11 MED ORDER — OXYCODONE-ACETAMINOPHEN 5-325 MG PO TABS: 1.0000 | ORAL_TABLET | ORAL | 0 refills | Status: DC | PRN

## 2015-10-11 NOTE — Telephone Encounter (Signed)
RX faxed to [facility] @ [phone], phone number [phone] 

## 2015-10-12 ENCOUNTER — Encounter: Payer: Self-pay | Admitting: Internal Medicine

## 2015-10-12 ENCOUNTER — Non-Acute Institutional Stay (SKILLED_NURSING_FACILITY): Payer: Medicaid Other | Admitting: Internal Medicine

## 2015-10-12 DIAGNOSIS — B182 Chronic viral hepatitis C: Secondary | ICD-10-CM

## 2015-10-12 DIAGNOSIS — S88112A Complete traumatic amputation at level between knee and ankle, left lower leg, initial encounter: Secondary | ICD-10-CM

## 2015-10-12 DIAGNOSIS — I10 Essential (primary) hypertension: Secondary | ICD-10-CM

## 2015-10-12 DIAGNOSIS — E1159 Type 2 diabetes mellitus with other circulatory complications: Secondary | ICD-10-CM

## 2015-10-12 DIAGNOSIS — R600 Localized edema: Secondary | ICD-10-CM | POA: Diagnosis not present

## 2015-10-12 DIAGNOSIS — Z89512 Acquired absence of left leg below knee: Secondary | ICD-10-CM

## 2015-10-12 DIAGNOSIS — G894 Chronic pain syndrome: Secondary | ICD-10-CM | POA: Diagnosis not present

## 2015-10-12 DIAGNOSIS — F1010 Alcohol abuse, uncomplicated: Secondary | ICD-10-CM

## 2015-10-12 DIAGNOSIS — E785 Hyperlipidemia, unspecified: Secondary | ICD-10-CM

## 2015-10-12 DIAGNOSIS — R2233 Localized swelling, mass and lump, upper limb, bilateral: Secondary | ICD-10-CM | POA: Diagnosis not present

## 2015-10-12 NOTE — Progress Notes (Signed)
Patient ID: Cory Weber, male   DOB: [birth year], 65 y.o.   MRN: [id number]    DATE:  10/12/2015  Location:     [facility] Room Number: 950 B Place of Service: SNF (31)   Extended Emergency Contact Information Primary Emergency Contact: Tye Savoy, [place] [address] [place] [employer] Phone: [phone] Relation: Sister Secondary Emergency Contact: Kerrin Mo, [place] [address] [place] [employer] Phone: [phone] Relation: Sister  Advanced Directive information Does patient have an advance directive?: Yes, Would patient like information on creating an advanced directive?: No - patient declined information, Type of Advance Directive: Out of facility DNR (pink MOST or yellow form), Does patient want to make changes to advanced directive?: No - Patient declined  Chief Complaint  Patient presents with  . Medical Management of Chronic Issues    Routine Visit    HPI:  65 yo male short term rehab resident seen today for f/u. He is c/a elevated CBGs in 200s at times. He was previously taking metformin 1000 mg BID but at hospital d/c he was started on insulin. He is c/a painful lumps in UE. He had US done that revealed 2 large soft tissue masses. He is unable to perform therapy due to pain. He reports left shoulder with reduced ROM and has req'd steroid injection in the past. He has persistent RLE edema. No other concerns. No f/c. No falls. Appetite ok and sleeping well.  Hypertension/RLE edema - stable on coreg 3.125 mg twice daily and  demadex to 20 mg daily  DM - CBGs in > 200 on novolog 3 units with meals and lantus 20 units nightly   S/p Left BKA due to infected left foot ulcer with PAD - tolerating tx. Pain controlled with percocet 5/325 mg 1 or 2 tabs every 6 hours as needed. Followed by Ortho/vascular sx. Will see Dr Trula Slade soon. Takes ASA daily and gabapentin  PUD - stable on pepcid 20 mg daily   Chronic  hepatitis C - no known tx. LFTs stable (ALT 15; AST 21 last month)  Phantom limb pain - stable on neurontin 300 mg three times daily   Hx CKD stage III - resolved. Cr 0.9  BPH - sx's stable on flomax daily   Hx CVA -  Stable. Takes asa 81 mg daily   ETOH abuse - takes folic acid and thiamine  Constipation - stable on miralax daily and senna s 2 tabs twice daily   He gets nutritional supplements per facility protocol  Past Medical History:  Diagnosis Date  . Anemia   . Asthma   . Cellulitis and abscess of foot 07/2015  . CVA (cerebral vascular accident) ([place]) 2015   denies residual on 08/15/2015  . Hepatitis C    states he was diagnosed in 2007 or 2007 while living in [place], [place]  . Hyperlipidemia   . Hypertension   . Peripheral neuropathy ([place])   . Pneumonia X 1  . PVD (peripheral vascular disease) ([place])   . Type II diabetes mellitus ([place])     Past Surgical History:  Procedure Laterality Date  . AMPUTATION Left 08/16/2015   Procedure: LEFT BELOW THE KNEE AMPUTATION ;  Surgeon: Serafina Mitchell, MD;  Location: [facility];  Service: Vascular;  Laterality: Left;  . BACK SURGERY    . BIOPSY N/A 09/03/2012   Procedure: BIOPSY;  Surgeon:  Daneil Dolin, MD;  Location: [facility];  Service: Endoscopy;  Laterality: N/A;  gastric and gastric mucosa  . BIOPSY N/A 12/03/2012   Procedure: BIOPSY;  Surgeon: Daneil Dolin, MD;  Location: [facility];  Service: Endoscopy;  Laterality: N/A;  . COLONOSCOPY WITH PROPOFOL N/A 09/03/2012   BPZ:WCHENID polyp-removed as outlined above. Prominent internal hemorrhoids. Tubular adenoma  . ESOPHAGOGASTRODUODENOSCOPY (EGD) WITH PROPOFOL N/A 09/03/2012   POE:UMPNTI hernia. Gastric diverticulum. Gastric ulcers with associated erosions. Duodenal erosions. Status post gastric biopsy. H.PYLORI gastritis   . ESOPHAGOGASTRODUODENOSCOPY (EGD) WITH PROPOFOL N/A 12/03/2012   Dr. Gala Romney: gastric diverticulum, gastric erosions and scar. Previously noted gastric ulcer completed  healed. Biopsy without H.pylori.   Marland Kitchen LIVER BIOPSY  2005   Done in [place], [place]. Chronic hepatitis with mild periportal inflammation, lobular unicellular necrosis and portal fibrosis. Grade 2, stage 1-2.  Marland Kitchen MAXIMUM ACCESS (MAS)POSTERIOR LUMBAR INTERBODY FUSION (PLIF) 1 LEVEL N/A 11/25/2013   Procedure: FOR MAXIMUM ACCESS (MAS) POSTERIOR LUMBAR INTERBODY FUSION (PLIF) 1 LEVEL;  Surgeon: Eustace Moore, MD;  Location: [facility];  Service: Neurosurgery;  Laterality: N/A;  FOR MAXIMUM ACCESS (MAS) POSTERIOR LUMBAR INTERBODY FUSION (PLIF) 1 LEVEL LUMBAR 3-4  . PERIPHERAL VASCULAR CATHETERIZATION Left 08/01/2015   Procedure: Lower Extremity Angiography;  Surgeon: Serafina Mitchell, MD;  Location: [facility];  Service: Cardiovascular;  Laterality: Left;  . PERIPHERAL VASCULAR CATHETERIZATION N/A 08/01/2015   Procedure: Abdominal Aortogram;  Surgeon: Serafina Mitchell, MD;  Location: [facility];  Service: Cardiovascular;  Laterality: N/A;  . PERIPHERAL VASCULAR CATHETERIZATION N/A 08/08/2015   Procedure: Abdominal Aortogram w/Lower Extremity;  Surgeon: Serafina Mitchell, MD;  Location: [facility];  Service: Cardiovascular;  Laterality: N/A;  . PERIPHERAL VASCULAR CATHETERIZATION Left 08/08/2015   Procedure: Peripheral Vascular Balloon Angioplasty;  Surgeon: Serafina Mitchell, MD;  Location: [facility];  Service: Cardiovascular;  Laterality: Left;  left popiteal artery, left peronealtrunk, left post tibial  . POLYPECTOMY N/A 09/03/2012   Procedure: POLYPECTOMY;  Surgeon: Daneil Dolin, MD;  Location[facility];  Service: Endoscopy;  Laterality: N/A;  cecal polyp  . TONSILLECTOMY      Patient Care Team: Iona Beard, MD as PCP - General (Family Medicine) Daneil Dolin, MD as Consulting Physician (Gastroenterology) Herminio Commons, MD as Attending Physician (Cardiology)  Social History   Social History  . Marital status: Divorced    Spouse name: N/A  . Number of children: 1    . Years of education: N/A   Occupational History  . trying to get disability    Social History Main Topics  . Smoking status: Former Smoker    Packs/day: 1.00    Years: 47.00    Types: Cigarettes    Quit date: 07/13/2015  . Smokeless tobacco: Never Used  . Alcohol use 0.0 oz/week     Comment: 08/15/2015 "stopped drinking 3-4 years ago"  . Drug use:     Types: Cocaine     Comment: 08/15/2015 "last used cocaine 3-4 months ago"  . Sexual activity: No   Other Topics Concern  . Not on file   Social History Narrative  . No narrative on file     reports that he quit smoking about 2 months ago. His smoking use included Cigarettes. He has a 47.00 pack-year smoking history. He has never used smokeless tobacco. He reports that he drinks alcohol. He reports that he uses drugs, including Cocaine.  Family History  Problem Relation Age of Onset  . Breast cancer Mother     deceased  . Cancer Mother   . Heart failure Mother   . Diabetes Father   . Hypertension Father   . Heart disease Father     deceased  . Hyperlipidemia Father   . Diabetes Sister   . Hypertension Sister   . Breast cancer Sister   . Colon cancer Neg Hx   . Liver disease Neg Hx    Family Status  Relation Status  . Mother   . Father   . Sister   . Sister   . Neg Hx     Immunization History  Administered Date(s) Administered  . Influenza,inj,Quad PF,36+ Mos 11/24/2013  . PPD Test 09/20/2015  . Pneumococcal Polysaccharide-23 11/24/2013    No Known Allergies  Medications: Patient's Medications  New Prescriptions   No medications on file  Previous Medications   ACIDOPHILUS (RISAQUAD) CAPS CAPSULE    Take 2 capsules by mouth daily.   AMINO ACIDS-PROTEIN HYDROLYS (FEEDING SUPPLEMENT, PRO-STAT SUGAR FREE 64,) LIQD    Take 90 mLs by mouth 2 (two) times daily.   ASPIRIN EC 81 MG TABLET    Take 1 tablet (81 mg total) by mouth daily.   CARVEDILOL (COREG) 3.125 MG TABLET    Take 1 tablet (3.125 mg total) by mouth  2 (two) times daily with a meal.   FAMOTIDINE (PEPCID) 20 MG TABLET    Take 1 tablet (20 mg total) by mouth daily.   FOLIC ACID (FOLVITE) 1 MG TABLET    Take 1 tablet (1 mg total) by mouth daily.   GABAPENTIN (NEURONTIN) 600 MG TABLET    Take 600 mg by mouth 3 (three) times daily.   INSULIN ASPART (NOVOLOG) 100 UNIT/ML INJECTION    Inject 3 Units into the skin 3 (three) times daily before meals.   INSULIN GLARGINE (LANTUS) 100 UNIT/ML INJECTION    Inject 20 Units into the skin at bedtime.   MULTIPLE VITAMIN (MULTIVITAMIN WITH MINERALS) TABS TABLET    Take 1 tablet by mouth daily.   OXYCODONE-ACETAMINOPHEN (PERCOCET/ROXICET) 5-325 MG TABLET    Take 1-2 tablets by mouth every 4 (four) hours as needed for severe pain.   POLYETHYLENE GLYCOL (MIRALAX / GLYCOLAX) PACKET    Take 17 g by mouth daily.   SENNA-DOCUSATE (SENOKOT-S) 8.6-50 MG TABLET    Take 2 tablets by mouth 2 (two) times daily.   SIMVASTATIN (ZOCOR) 20 MG TABLET    Take 20 mg by mouth daily at 6 PM.   TAMSULOSIN (FLOMAX) 0.4 MG CAPS CAPSULE    Take 1 capsule (0.4 mg total) by mouth daily.   THIAMINE 100 MG TABLET    Take 1 tablet (100 mg total) by mouth daily.   TORSEMIDE (DEMADEX) 20 MG TABLET    Take 20 mg by mouth daily.   Modified Medications   No medications on file  Discontinued Medications   GABAPENTIN (NEURONTIN) 300 MG CAPSULE    Take 1 capsule (300 mg total) by mouth 3 (three) times daily.   INSULIN ASPART (NOVOLOG) 100 UNIT/ML INJECTION    Inject 5 Units into the skin 3 (three) times daily with meals.   INSULIN GLARGINE (LANTUS) 100 UNIT/ML INJECTION    Inject 0.17 mLs (17 Units total) into the skin daily.    Review of Systems  Cardiovascular: Positive for leg swelling.  Musculoskeletal: Positive for arthralgias and gait problem.  Neurological: Positive for numbness.  All other systems reviewed  and are negative.   Vitals:   10/12/15 1301  BP: (!) 158/78  Pulse: 88  Resp: 20  Temp: 98 F (36.7 C)  TempSrc: Oral    SpO2: 97%  Weight: 193 lb 6.4 oz (87.7 kg)  Height: 5' 10" (1.778 m)   Body mass index is 27.75 kg/m.  Physical Exam  Constitutional: He is oriented to person, place, and time. He appears well-developed and well-nourished.  Sitting in w/c in NAD  HENT:  Mouth/Throat: Oropharynx is clear and moist.  Eyes: Pupils are equal, round, and reactive to light. No scleral icterus.  Neck: Neck supple. Carotid bruit is not present. No thyromegaly present.  Cardiovascular: Normal rate, regular rhythm, normal heart sounds and intact distal pulses.  Exam reveals no gallop and no friction rub.   No murmur heard. Left BKA; +1 pitting RLE edema. No right calf TTP  Pulmonary/Chest: Effort normal and breath sounds normal. He has no wheezes. He has no rales. He exhibits no tenderness.  Abdominal: Soft. Bowel sounds are normal. He exhibits no distension, no abdominal bruit, no pulsatile midline mass and no mass. There is no hepatomegaly. There is no tenderness. There is no rebound and no guarding.  Musculoskeletal: He exhibits edema and tenderness (left shoulder with reduced ROM and crepitus on movement).  Left BKA  Lymphadenopathy:    He has no cervical adenopathy.  Neurological: He is alert and oriented to person, place, and time.  Skin: Skin is warm, dry and intact. No rash noted.     Psychiatric: He has a normal mood and affect. His behavior is normal. Thought content normal.     Labs reviewed: Nursing Home on 09/26/2015  Component Date Value Ref Range Status  . Glucose 09/18/2015 166  mg/dL Final  . BUN 09/18/2015 10  4 - 21 mg/dL Final  . Creatinine 09/18/2015 0.9  0.6 - 1.3 mg/dL Final  . Potassium 09/18/2015 4.1  3.4 - 5.3 mmol/L Final  . Sodium 09/18/2015 140  137 - 147 mmol/L Final  Nursing Home on 09/07/2015  Component Date Value Ref Range Status  . WBC 09/01/2015 8.1  10^3/mL Final  . Glucose 09/01/2015 128  mg/dL Final  . BUN 09/01/2015 33* 4 - 21 mg/dL Final  . Creatinine  09/01/2015 1.3  0.6 - 1.3 mg/dL Final  . Potassium 09/01/2015 5.5* 3.4 - 5.3 mmol/L Final  . Sodium 09/01/2015 134* 137 - 147 mmol/L Final  . Hemoglobin 08/30/2015 8.8* 13.5 - 17.5 g/dL Final  . HCT 08/30/2015 28* 41 - 53 % Final  . Platelets 08/30/2015 494* 150 - 399 K/L Final  . WBC 08/30/2015 7.3  10^3/mL Final  . Glucose 09/02/2015 115  mg/dL Final  . BUN 09/02/2015 27* 4 - 21 mg/dL Final  . Creatinine 09/02/2015 1.2  0.6 - 1.3 mg/dL Final  . Potassium 09/02/2015 5.3  3.4 - 5.3 mmol/L Final  . Sodium 09/02/2015 136* 137 - 147 mmol/L Final  . Glucose 09/04/2015 117  mg/dL Final  . BUN 09/04/2015 24* 4 - 21 mg/dL Final  . Creatinine 09/04/2015 1.0  0.6 - 1.3 mg/dL Final  . Sodium 09/04/2015 134* 137 - 147 mmol/L Final  Admission on 08/21/2015, Discharged on 09/05/2015  No results displayed because visit has over 200 results.    Admission on 08/14/2015, Discharged on 08/21/2015  No results displayed because visit has over 200 results.    Admission on 07/23/2015, Discharged on 08/10/2015  No results displayed because visit has over 200 results.  Admission on 07/18/2015, Discharged on 07/20/2015  Component Date Value Ref Range Status  . WBC 07/18/2015 13.0* 4.0 - 10.5 K/uL Final  . RBC 07/18/2015 2.85* 4.22 - 5.81 MIL/uL Final  . Hemoglobin 07/18/2015 7.7* 13.0 - 17.0 g/dL Final  . HCT 07/18/2015 24.9* 39.0 - 52.0 % Final  . MCV 07/18/2015 87.4  78.0 - 100.0 fL Final  . MCH 07/18/2015 27.0  26.0 - 34.0 pg Final  . MCHC 07/18/2015 30.9  30.0 - 36.0 g/dL Final  . RDW 07/18/2015 13.9  11.5 - 15.5 % Final  . Platelets 07/18/2015 414* 150 - 400 K/uL Final  . Neutrophils Relative % 07/18/2015 80  % Final  . Neutro Abs 07/18/2015 10.3* 1.7 - 7.7 K/uL Final  . Lymphocytes Relative 07/18/2015 14  % Final  . Lymphs Abs 07/18/2015 1.8  0.7 - 4.0 K/uL Final  . Monocytes Relative 07/18/2015 6  % Final  . Monocytes Absolute 07/18/2015 0.8  0.1 - 1.0 K/uL Final  . Eosinophils Relative  07/18/2015 0  % Final  . Eosinophils Absolute 07/18/2015 0.0  0.0 - 0.7 K/uL Final  . Basophils Relative 07/18/2015 0  % Final  . Basophils Absolute 07/18/2015 0.0  0.0 - 0.1 K/uL Final  . Sodium 07/18/2015 134* 135 - 145 mmol/L Final  . Potassium 07/18/2015 4.6  3.5 - 5.1 mmol/L Final  . Chloride 07/18/2015 102  101 - 111 mmol/L Final  . CO2 07/18/2015 26  22 - 32 mmol/L Final  . Glucose, Bld 07/18/2015 318* 65 - 99 mg/dL Final  . BUN 07/18/2015 29* 6 - 20 mg/dL Final  . Creatinine, Ser 07/18/2015 1.43* 0.61 - 1.24 mg/dL Final  . Calcium 07/18/2015 9.4  8.9 - 10.3 mg/dL Final  . Total Protein 07/18/2015 8.8* 6.5 - 8.1 g/dL Final  . Albumin 07/18/2015 3.6  3.5 - 5.0 g/dL Final  . AST 07/18/2015 18  15 - 41 U/L Final  . ALT 07/18/2015 14* 17 - 63 U/L Final  . Alkaline Phosphatase 07/18/2015 51  38 - 126 U/L Final  . Total Bilirubin 07/18/2015 0.3  0.3 - 1.2 mg/dL Final  . GFR calc non Af Amer 07/18/2015 51* >60 mL/min Final  . GFR calc Af Amer 07/18/2015 59* >60 mL/min Final   Comment: (NOTE) The eGFR has been calculated using the CKD EPI equation. This calculation has not been validated in all clinical situations. eGFR's persistently <60 mL/min signify possible Chronic Kidney Disease.   . Anion gap 07/18/2015 6  5 - 15 Final  . Specimen Description 07/23/2015 BLOOD LEFT ANTECUBITAL   Final  . Special Requests 07/23/2015 BOTTLES DRAWN AEROBIC AND ANAEROBIC 10CC EACH   Final  . Culture 07/23/2015 NO GROWTH 5 DAYS   Final  . Report Status 07/23/2015 07/23/2015 FINAL   Final  . Specimen Description 07/23/2015 BLOOD LEFT ARM DRAWN BY RN   Final  . Special Requests 07/23/2015 BOTTLES DRAWN AEROBIC AND ANAEROBIC 5CC EACH   Final  . Culture 07/23/2015 NO GROWTH 5 DAYS   Final  . Report Status 07/23/2015 07/23/2015 FINAL   Final  . Lactic Acid, Venous 07/18/2015 2.74* 0.5 - 2.0 mmol/L Final  . ABO/RH(D) 07/19/2015 O POS   Final  . Antibody Screen 07/19/2015 NEG   Final  . Sample  Expiration 07/19/2015 07/21/2015   Final  . Unit Number 07/19/2015 [id number]   Final  . Blood Component Type 07/19/2015 RED CELLS,LR   Final  . Unit division 07/19/2015 00   Final  .  Status of Unit 07/19/2015 ISSUED,FINAL   Final  . Transfusion Status 07/19/2015 OK TO TRANSFUSE   Final  . Crossmatch Result 07/19/2015 Compatible   Final  . Order Confirmation 07/18/2015 ORDER PROCESSED BY BLOOD BANK   Final  . Lactic Acid, Venous 07/18/2015 2.14* 0.5 - 2.0 mmol/L Final  . Glucose-Capillary 07/18/2015 207* 65 - 99 mg/dL Final  . Glucose-Capillary 07/18/2015 82  65 - 99 mg/dL Final  . WBC 07/19/2015 12.5* 4.0 - 10.5 K/uL Final  . RBC 07/19/2015 3.24* 4.22 - 5.81 MIL/uL Final  . Hemoglobin 07/19/2015 9.2* 13.0 - 17.0 g/dL Final  . HCT 07/19/2015 28.3* 39.0 - 52.0 % Final  . MCV 07/19/2015 87.3  78.0 - 100.0 fL Final  . MCH 07/19/2015 28.4  26.0 - 34.0 pg Final  . MCHC 07/19/2015 32.5  30.0 - 36.0 g/dL Final  . RDW 07/19/2015 13.9  11.5 - 15.5 % Final  . Platelets 07/19/2015 370  150 - 400 K/uL Final  . Sodium 07/19/2015 135  135 - 145 mmol/L Final  . Potassium 07/19/2015 4.3  3.5 - 5.1 mmol/L Final  . Chloride 07/19/2015 102  101 - 111 mmol/L Final  . CO2 07/19/2015 26  22 - 32 mmol/L Final  . Glucose, Bld 07/19/2015 152* 65 - 99 mg/dL Final  . BUN 07/19/2015 19  6 - 20 mg/dL Final  . Creatinine, Ser 07/19/2015 1.32* 0.61 - 1.24 mg/dL Final  . Calcium 07/19/2015 9.0  8.9 - 10.3 mg/dL Final  . GFR calc non Af Amer 07/19/2015 56* >60 mL/min Final  . GFR calc Af Amer 07/19/2015 >60  >60 mL/min Final   Comment: (NOTE) The eGFR has been calculated using the CKD EPI equation. This calculation has not been validated in all clinical situations. eGFR's persistently <60 mL/min signify possible Chronic Kidney Disease.   . Anion gap 07/19/2015 7  5 - 15 Final  . Glucose-Capillary 07/18/2015 252* 65 - 99 mg/dL Final  . Comment 1 07/18/2015 Notify RN   Final  . Comment 2 07/18/2015 Document  in Chart   Final  . Glucose-Capillary 07/18/2015 256* 65 - 99 mg/dL Final  . Comment 1 07/18/2015 Notify RN   Final  . Comment 2 07/18/2015 Document in Chart   Final  . Glucose-Capillary 07/19/2015 157* 65 - 99 mg/dL Final  . Comment 1 07/19/2015 Notify RN   Final  . Fecal Occult Bld 07/19/2015 POSITIVE* NEGATIVE Final  . Glucose-Capillary 07/19/2015 79  65 - 99 mg/dL Final  . Glucose-Capillary 07/19/2015 130* 65 - 99 mg/dL Final  . Comment 1 07/19/2015 Notify RN   Final  . Comment 2 07/19/2015 Document in Chart   Final  . Glucose-Capillary 07/19/2015 233* 65 - 99 mg/dL Final  . Comment 1 07/19/2015 Notify RN   Final  . Comment 2 07/19/2015 Document in Chart   Final  . Glucose-Capillary 07/20/2015 160* 65 - 99 mg/dL Final  . Glucose-Capillary 07/20/2015 45* 65 - 99 mg/dL Final  . Comment 1 07/20/2015 Notify RN   Final  . Glucose-Capillary 07/20/2015 98  65 - 99 mg/dL Final    No results found.   Assessment/Plan   ICD-9-CM ICD-10-CM   1. Skin lump of arm, bilateral 782.2 R22.33    large right supraclavicular; left arm soft tissue masses per [place]  2. Type 2 diabetes mellitus with vascular disease (HCC) 250.70 E11.59    443.81    3. Essential hypertension 401.9 I10   4. Amputation of left lower extremity  below knee ([place]) V49.75 Z89.512   5. Edema of right lower extremity 782.3 R60.0   6. HLD (hyperlipidemia) 272.4 E78.5   7. Chronic hepatitis C without hepatic coma (HCC) 070.54 B18.2   8. ETOH abuse 305.00 F10.10   9. Chronic pain syndrome 338.4 G89.4     Surgery eval pending. No lifting > 10lbs due to soft tissue masses in UE b/l  Increase lantus to 24 units qhs  Restart metformin at 520m daily. Will titrate according to CBGs  Cont other meds as ordered. He is aware of opioid ADRs and potential for addiction.  PT/OT/ST as ordered and tolerated  Will follow    S. CPerlie Gold [facility] [address] StreetGGarvin  [phone] (Monday-Friday 8 AM - 5 PM) ([phone] 5 PM and follow prompts

## 2015-10-19 ENCOUNTER — Ambulatory Visit (INDEPENDENT_AMBULATORY_CARE_PROVIDER_SITE_OTHER): Payer: Medicaid Other | Admitting: Gastroenterology

## 2015-10-19 ENCOUNTER — Encounter: Payer: Self-pay | Admitting: Gastroenterology

## 2015-10-19 ENCOUNTER — Other Ambulatory Visit (INDEPENDENT_AMBULATORY_CARE_PROVIDER_SITE_OTHER): Payer: Medicaid Other

## 2015-10-19 VITALS — BP 144/72 | HR 92

## 2015-10-19 DIAGNOSIS — N183 Chronic kidney disease, stage 3 unspecified: Secondary | ICD-10-CM

## 2015-10-19 DIAGNOSIS — D638 Anemia in other chronic diseases classified elsewhere: Secondary | ICD-10-CM

## 2015-10-19 DIAGNOSIS — K7469 Other cirrhosis of liver: Secondary | ICD-10-CM

## 2015-10-19 DIAGNOSIS — E1159 Type 2 diabetes mellitus with other circulatory complications: Secondary | ICD-10-CM

## 2015-10-19 DIAGNOSIS — B182 Chronic viral hepatitis C: Secondary | ICD-10-CM

## 2015-10-19 LAB — CBC WITH DIFFERENTIAL/PLATELET
BASOS PCT: 0.5 % (ref 0.0–3.0)
Basophils Absolute: 0 10*3/uL (ref 0.0–0.1)
EOS PCT: 2.3 % (ref 0.0–5.0)
Eosinophils Absolute: 0.2 10*3/uL (ref 0.0–0.7)
HCT: 21.7 % — CL (ref 39.0–52.0)
Hemoglobin: 7.2 g/dL — CL (ref 13.0–17.0)
LYMPHS ABS: 1.4 10*3/uL (ref 0.7–4.0)
Lymphocytes Relative: 16.3 % (ref 12.0–46.0)
MCHC: 33.1 g/dL (ref 30.0–36.0)
MCV: 80.2 fl (ref 78.0–100.0)
MONOS PCT: 9.8 % (ref 3.0–12.0)
Monocytes Absolute: 0.9 10*3/uL (ref 0.1–1.0)
NEUTROS PCT: 71.1 % (ref 43.0–77.0)
Neutro Abs: 6.3 10*3/uL (ref 1.4–7.7)
Platelets: 417 10*3/uL — ABNORMAL HIGH (ref 150.0–400.0)
RBC: 2.7 Mil/uL — AB (ref 4.22–5.81)
RDW: 15.5 % (ref 11.5–15.5)
WBC: 8.9 10*3/uL (ref 4.0–10.5)

## 2015-10-19 LAB — PROTIME-INR
INR: 1.2 ratio — ABNORMAL HIGH (ref 0.8–1.0)
Prothrombin Time: 12.7 s (ref 9.6–13.1)

## 2015-10-19 NOTE — Patient Instructions (Signed)
If you are age 63 or older, your body mass index should be between 23-30. Your There is no height or weight on file to calculate BMI. If this is out of the aforementioned range listed, please consider follow up with your Primary Care Provider.  If you are age 85 or younger, your body mass index should be between 19-25. Your There is no height or weight on file to calculate BMI. If this is out of the aformentioned range listed, please consider follow up with your Primary Care Provider.   You have been scheduled for an endoscopy. Please follow written instructions given to you at your visit today. If you use inhalers (even only as needed), please bring them with you on the day of your procedure.  You have been scheduled for an abdominal ultrasound at [facility] Radiology (1st floor of hospital) on 10-26-2015 at 830am. Please arrive 15 minutes prior to your appointment for registration. Make certain not to have anything to eat or drink 6 hours prior to your appointment. Should you need to reschedule your appointment, please contact radiology at [phone]. This test typically takes about 30 minutes to perform.  Your physician has requested that you go to the basement for lab work before leaving today.  Thank you for choosing [facility]  Dr Wilfrid Lund III

## 2015-10-19 NOTE — Progress Notes (Signed)
[facility] Consult Note:  History: Cory Weber 10/19/2015  Referring physician: Maggie Font, MD  Reason for consult/chief complaint: hepatic cirrhosis   Subjective  HPI:  Cory Weber is a 65 year old man referred to [place] by the infectious disease clinic after his hepatitis C treatment with them earlier this year. I reviewed their office note from April. He had a genotype 1 virus treated with harvoni, and had a negative viral load indicating ETR on 06/08/2015. He has previously been seen by the [facility] as far back as 2014 for his hepatitis C. A previous issues with cocaine and alcohol abuse, which she says is a thing of the past. He also had previous H. pylori the cleared after treatment, last upper endoscopy in 2014. Infectious disease note indicates F3/F4 fibrosis on elastography. I reviewed hospital records from [name] admission there in July of this year, at which time he had left foot gangrene that had maggot infestation. This required a left BKA, from which she is recovering at a local rehabilitation. I have noted that he has chronic normocytic anemia, last hemoglobin checked about a month ago.   He denies abdominal pain, nausea, vomiting, early satiety or weight loss. He denies altered bowel habits or rectal bleeding.   ROS:  Review of Systems  Constitutional: Negative for appetite change and unexpected weight change.  HENT: Negative for mouth sores and voice change.   Eyes: Negative for pain and redness.  Respiratory: Negative for cough and shortness of breath.   Cardiovascular: Negative for chest pain and palpitations.  Genitourinary: Negative for dysuria and hematuria.  Musculoskeletal: Negative for arthralgias and myalgias.       Residual left leg pain.  Skin: Negative for pallor and rash.  Neurological: Negative for weakness and headaches.  Hematological: Negative for adenopathy.     Past Medical History: Past Medical History:  Diagnosis  Date  . Anemia   . Asthma   . Cellulitis and abscess of foot 07/2015  . CVA (cerebral vascular accident) ([place]) 2015   denies residual on 08/15/2015  . Hepatitis C    states he was diagnosed in 2007 or 2007 while living in [place], [place]  . Hyperlipidemia   . Hypertension   . Peripheral neuropathy ([place])   . Pneumonia X 1  . PVD (peripheral vascular disease) ([place])   . Type II diabetes mellitus ([place])      Past Surgical History: Past Surgical History:  Procedure Laterality Date  . AMPUTATION Left 08/16/2015   Procedure: LEFT BELOW THE KNEE AMPUTATION ;  Surgeon: Serafina Mitchell, MD;  Location: [place];  Service: Vascular;  Laterality: Left;  . BACK SURGERY    . BIOPSY N/A 09/03/2012   Procedure: BIOPSY;  Surgeon: Daneil Dolin, MD;  [facility];  Service: Endoscopy;  Laterality: N/A;  gastric and gastric mucosa  . BIOPSY N/A 12/03/2012   Procedure: BIOPSY;  Surgeon: Daneil Dolin, MD;  Location: [facility];  Service: Endoscopy;  Laterality: N/A;  . COLONOSCOPY WITH PROPOFOL N/A 09/03/2012   VHQ:IONGEXB polyp-removed as outlined above. Prominent internal hemorrhoids. Tubular adenoma  . ESOPHAGOGASTRODUODENOSCOPY (EGD) WITH PROPOFOL N/A 09/03/2012   MWU:[id number] hernia. Gastric diverticulum. Gastric ulcers with associated erosions. Duodenal erosions. Status post gastric biopsy. H.PYLORI gastritis   . ESOPHAGOGASTRODUODENOSCOPY (EGD) WITH PROPOFOL N/A 12/03/2012   Dr. Gala Romney: gastric diverticulum, gastric erosions and scar. Previously noted gastric ulcer completed healed. Biopsy without H.pylori.   Marland Kitchen LIVER BIOPSY  2005   Done in  [place], [place]. Chronic hepatitis with mild periportal inflammation, lobular unicellular necrosis and portal fibrosis. Grade 2, stage 1-2.  Marland Kitchen MAXIMUM ACCESS (MAS)POSTERIOR LUMBAR INTERBODY FUSION (PLIF) 1 LEVEL N/A 11/25/2013   Procedure: FOR MAXIMUM ACCESS (MAS) POSTERIOR LUMBAR INTERBODY FUSION (PLIF) 1 LEVEL;  Surgeon: Eustace Moore, MD;  Location: [place];   Service: Neurosurgery;  Laterality: N/A;  FOR MAXIMUM ACCESS (MAS) POSTERIOR LUMBAR INTERBODY FUSION (PLIF) 1 LEVEL LUMBAR 3-4  . PERIPHERAL VASCULAR CATHETERIZATION Left 08/01/2015   Procedure: Lower Extremity Angiography;  Surgeon: Serafina Mitchell, MD;  Location: [place] [facility];  Service: Cardiovascular;  Laterality: Left;  . PERIPHERAL VASCULAR CATHETERIZATION N/A 08/01/2015   Procedure: Abdominal Aortogram;  Surgeon: Serafina Mitchell, MD;  Location: [place] [facility];  Service: Cardiovascular;  Laterality: N/A;  . PERIPHERAL VASCULAR CATHETERIZATION N/A 08/08/2015   Procedure: Abdominal Aortogram w/Lower Extremity;  Surgeon: Serafina Mitchell, MD;  Location: [place] [facility];  Service: Cardiovascular;  Laterality: N/A;  . PERIPHERAL VASCULAR CATHETERIZATION Left 08/08/2015   Procedure: Peripheral Vascular Balloon Angioplasty;  Surgeon: Serafina Mitchell, MD;  Location: [place] [facility];  Service: Cardiovascular;  Laterality: Left;  left popiteal artery, left peronealtrunk, left post tibial  . POLYPECTOMY N/A 09/03/2012   Procedure: POLYPECTOMY;  Surgeon: Daneil Dolin, MD;  [facility];  Service: Endoscopy;  Laterality: N/A;  cecal polyp  . TONSILLECTOMY       Family History: Family History  Problem Relation Age of Onset  . Breast cancer Mother   . Heart failure Mother   . Diabetes Father   . Hypertension Father   . Heart disease Father   . Hyperlipidemia Father   . Diabetes Sister   . Hypertension Sister   . Breast cancer Sister   . Colon cancer Neg Hx   . Liver disease Neg Hx     Social History: Social History   Social History  . Marital status: Divorced    Spouse name: N/A  . Number of children: 1  . Years of education: N/A   Occupational History  . trying to get disability    Social History Main Topics  . Smoking status: Former Smoker    Packs/day: 1.00    Years: 47.00    Types: Cigarettes    Quit date: 07/13/2015  . Smokeless tobacco: Never Used  .  Alcohol use 0.0 oz/week     Comment: 08/15/2015 "stopped drinking 3-4 years ago"  . Drug use:     Types: Cocaine     Comment: 08/15/2015 "last used cocaine 3-4 months ago"  . Sexual activity: No   Other Topics Concern  . None   Social History Narrative  . None    Allergies: No Known Allergies  Outpatient Meds: Current Outpatient Prescriptions  Medication Sig Dispense Refill  . acidophilus (RISAQUAD) CAPS capsule Take 2 capsules by mouth daily.    . Amino Acids-Protein Hydrolys (FEEDING SUPPLEMENT, PRO-STAT SUGAR FREE 64,) LIQD Take 90 mLs by mouth 2 (two) times daily.    Marland Kitchen aspirin EC 81 MG tablet Take 1 tablet (81 mg total) by mouth daily. 100 tablet 3  . carvedilol (COREG) 3.125 MG tablet Take 1 tablet (3.125 mg total) by mouth 2 (two) times daily with a meal. 60 tablet 0  . famotidine (PEPCID) 20 MG tablet Take 1 tablet (20 mg total) by mouth daily. 30 tablet 0  . folic acid (FOLVITE) 1 MG tablet Take 1 tablet (1 mg total) by mouth  daily. 30 tablet 0  . gabapentin (NEURONTIN) 600 MG tablet Take 600 mg by mouth 3 (three) times daily.    . insulin aspart (NOVOLOG) 100 UNIT/ML injection Inject 3 Units into the skin 3 (three) times daily before meals.    . insulin glargine (LANTUS) 100 UNIT/ML injection Inject 20 Units into the skin at bedtime.    . metFORMIN (GLUCOPHAGE) 500 MG tablet Take 500 mg by mouth daily with breakfast.    . Multiple Vitamin (MULTIVITAMIN WITH MINERALS) TABS tablet Take 1 tablet by mouth daily. 30 tablet 0  . Multiple Vitamins-Minerals (DECUBI-VITE PO) Take 1 capsule by mouth daily.    Marland Kitchen oxyCODONE-acetaminophen (PERCOCET/ROXICET) 5-325 MG tablet Take 1-2 tablets by mouth every 4 (four) hours as needed for severe pain. 180 tablet 0  . polyethylene glycol (MIRALAX / GLYCOLAX) packet Take 17 g by mouth daily. 14 each 0  . senna-docusate (SENOKOT-S) 8.6-50 MG tablet Take 2 tablets by mouth 2 (two) times daily. 30 tablet 0  . simvastatin (ZOCOR) 20 MG tablet Take 20  mg by mouth daily at 6 PM.    . tamsulosin (FLOMAX) 0.4 MG CAPS capsule Take 1 capsule (0.4 mg total) by mouth daily. 30 capsule 0  . thiamine 100 MG tablet Take 1 tablet (100 mg total) by mouth daily. 30 tablet 0  . torsemide (DEMADEX) 20 MG tablet Take 20 mg by mouth daily.      No current facility-administered medications for this visit.       ___________________________________________________________________ Objective   Exam:  BP (!) 144/72 (BP Location: Left Arm, Patient Position: Sitting, Cuff Size: Normal)   Pulse 92    General: this is a(n)Chronically ill-appearing man, wheelchair-bound, overweight, good muscle mass  sclera anicteric, no redness  ENT: oral mucosa moist without lesions, no cervical or supraclavicular lymphadenopathy, Edentulous  Cardiac: RRR without murmur, S1/S2, no JVD, mild right ankle edema   Resp: clear to auscultation bilaterally, normal RR and effort nsoft, no tenderness, with active bowel sounds. No guarding or palpable organomegaly noted., though limited by wheelchair position. He cannot get on the exam table with assistance  Skin; warm and dry, no rash or jaundice noted. No palmar erythema or spider nevi   Neuro: awake, alert and oriented x 3. Normal gross motor function and fluent speech. No asterixis.  He has had a left BKA  Labs:  CBC    Component Value Date/Time   WBC 8.9 10/19/2015 1002   RBC 2.70 (L) 10/19/2015 1002   HGB 7.2 Repeated and verified X2. (LL) 10/19/2015 1002   HCT 21.7 Repeated and verified X2. (LL) 10/19/2015 1002   HCT 32 10/31/2011 1319   PLT 417.0 (H) 10/19/2015 1002   MCV 80.2 10/19/2015 1002   MCV 82.8 10/31/2011 1319   MCH 27.7 09/01/2015 0519   MCHC 33.1 10/19/2015 1002   RDW 15.5 10/19/2015 1002   LYMPHSABS 1.4 10/19/2015 1002   MONOABS 0.9 10/19/2015 1002   EOSABS 0.2 10/19/2015 1002   BASOSABS 0.0 10/19/2015 1002   CMP Latest Ref Rng & Units 09/18/2015 09/04/2015 09/04/2015  Glucose 65 - 99 mg/dL -  117(H) -  BUN 4 - 21 mg/dL 10 24(H) 24(A)  Creatinine 0.6 - 1.3 mg/dL 0.9 1.02 1.0  Sodium 137 - 147 mmol/L 140 134(L) 134(A)  Potassium 3.4 - 5.3 mmol/L 4.1 4.7 -  Chloride 101 - 111 mmol/L - 101 -  CO2 22 - 32 mmol/L - 28 -  Calcium 8.9 - 10.3 mg/dL - 9.5 -  Total Protein 6.5 - 8.1 g/dL - - -  Total Bilirubin 0.3 - 1.2 mg/dL - - -  Alkaline Phos 38 - 126 U/L - - -  AST 15 - 41 U/L - - -  ALT 17 - 63 U/L - - -   His hemoglobin was 8.7 on 09/01/2015 month ago 07/29/15  INR 1.3 (in Jan was 0.97)  Radiologic Studies: Reviewed CT scan abdomen and pelvis report from 04/01/2014. Liver is noted to be unremarkable at that time.  Assessment  Encounter Diagnoses  Name Primary?  Marland Kitchen Anemia of chronic disease Yes  . Chronic kidney disease, stage 3   . Chronic hepatitis C without hepatic coma ([place])   . Other cirrhosis of liver ([place])   . Type 2 diabetes mellitus with vascular disease ([place])     I sent the patient for a updated labs after his clinic visit, and the above hemoglobin became available afterwards.  He has reported just fibrosis on noninvasive imaging, but has no clinical stigmata of advanced liver disease. It seems reasonable to screen him for varices and hepatoma, but it does not yet clear how best to follow this in the future. He has anemia of chronic disease that is unrelated to his liver disease, and he has no signs of GI bleeding.  Plan:   right upper quadrant ultrasound and AFP INR  pending  We will contact the supervising physician at his rehabilitation facility today and get these lab results to them so they can transfuse him blood and do any further workup of his anemia. He does not appear to require emergency room evaluation.   Upper endoscopy to screen for esophageal varices.   The benefits and risks of the planned procedure were described in detail with the patient or (when appropriate) their health care proxy.  Risks were outlined as including, but not limited to,  bleeding, infection, perforation, adverse medication reaction leading to cardiac or pulmonary decompensation, or pancreatitis (if ERCP).  The limitation of incomplete mucosal visualization was also discussed.  No guarantees or warranties were given.   Hepatitis C quantitative PCR to confirm SVR US/AFP, inr  Thank you for the courtesy of this consult.  Please call me with any questions or concerns.  Nelida Meuse III  CC: Maggie Font, MD

## 2015-10-20 LAB — AFP TUMOR MARKER: AFP TUMOR MARKER: 1.4 ng/mL (ref <6.1)

## 2015-10-26 ENCOUNTER — Ambulatory Visit (HOSPITAL_COMMUNITY): Admission: RE | Admit: 2015-10-26 | Payer: Medicaid Other | Source: Ambulatory Visit

## 2015-10-28 LAB — CBC AND DIFFERENTIAL
HCT: 24 % — AB (ref 41–53)
HEMOGLOBIN: 7.4 g/dL — AB (ref 13.5–17.5)
Platelets: 538 10*3/uL — AB (ref 150–399)
WBC: 9 10^3/mL

## 2015-11-01 ENCOUNTER — Other Ambulatory Visit: Payer: Medicaid Other

## 2015-11-03 ENCOUNTER — Ambulatory Visit (HOSPITAL_COMMUNITY)
Admission: RE | Admit: 2015-11-03 | Discharge: 2015-11-03 | Disposition: A | Discharge status: Home / Self Care | Payer: Medicaid Other | Source: Ambulatory Visit | Attending: Gastroenterology | Admitting: Gastroenterology

## 2015-11-03 DIAGNOSIS — K7469 Other cirrhosis of liver: Secondary | ICD-10-CM | POA: Insufficient documentation

## 2015-11-03 DIAGNOSIS — D638 Anemia in other chronic diseases classified elsewhere: Secondary | ICD-10-CM | POA: Insufficient documentation

## 2015-11-03 DIAGNOSIS — E1122 Type 2 diabetes mellitus with diabetic chronic kidney disease: Secondary | ICD-10-CM | POA: Diagnosis not present

## 2015-11-03 DIAGNOSIS — E1159 Type 2 diabetes mellitus with other circulatory complications: Secondary | ICD-10-CM | POA: Insufficient documentation

## 2015-11-03 DIAGNOSIS — N183 Chronic kidney disease, stage 3 unspecified: Secondary | ICD-10-CM

## 2015-11-03 DIAGNOSIS — B182 Chronic viral hepatitis C: Secondary | ICD-10-CM | POA: Insufficient documentation

## 2015-11-14 ENCOUNTER — Encounter: Payer: Self-pay | Admitting: Adult Health

## 2015-11-14 ENCOUNTER — Non-Acute Institutional Stay (SKILLED_NURSING_FACILITY): Payer: Medicaid Other | Admitting: Adult Health

## 2015-11-14 DIAGNOSIS — K279 Peptic ulcer, site unspecified, unspecified as acute or chronic, without hemorrhage or perforation: Secondary | ICD-10-CM | POA: Diagnosis not present

## 2015-11-14 DIAGNOSIS — N400 Enlarged prostate without lower urinary tract symptoms: Secondary | ICD-10-CM

## 2015-11-14 DIAGNOSIS — B182 Chronic viral hepatitis C: Secondary | ICD-10-CM | POA: Diagnosis not present

## 2015-11-14 DIAGNOSIS — N183 Chronic kidney disease, stage 3 unspecified: Secondary | ICD-10-CM

## 2015-11-14 DIAGNOSIS — E1151 Type 2 diabetes mellitus with diabetic peripheral angiopathy without gangrene: Secondary | ICD-10-CM | POA: Diagnosis not present

## 2015-11-14 DIAGNOSIS — D638 Anemia in other chronic diseases classified elsewhere: Secondary | ICD-10-CM

## 2015-11-14 DIAGNOSIS — G546 Phantom limb syndrome with pain: Secondary | ICD-10-CM

## 2015-11-14 DIAGNOSIS — I10 Essential (primary) hypertension: Secondary | ICD-10-CM

## 2015-11-14 DIAGNOSIS — I739 Peripheral vascular disease, unspecified: Secondary | ICD-10-CM | POA: Diagnosis not present

## 2015-11-14 NOTE — Progress Notes (Signed)
Patient ID: Cory Weber, male   DOB: 1950-06-16, 65 y.o.   MRN: [id number]    Location:   [facility] Room Number: 223-B Place of Service:  SNF (31)   CODE STATUS: Full Code  No Known Allergies  Chief Complaint  Patient presents with  . Medical Management of Chronic Issues    Follow up    HPI:  He is a long term resident of this facility being seen for the management of his chronic illnesses. Overall his status is stable. He is out and about throughout the facility. He tells me that he is feeling good. There are no nursing concerns at this time.    Past Medical History:  Diagnosis Date  . Anemia   . Asthma   . Cellulitis and abscess of foot 07/2015  . CVA (cerebral vascular accident) ([place]) 2015   denies residual on 08/15/2015  . Hepatitis C    states he was diagnosed in 2007 or 2007 while living in [place], [place]  . Hyperlipidemia   . Hypertension   . Peripheral neuropathy ([place])   . Pneumonia X 1  . PVD (peripheral vascular disease) ([place])   . Type II diabetes mellitus ([place])     Past Surgical History:  Procedure Laterality Date  . AMPUTATION Left 08/16/2015   Procedure: LEFT BELOW THE KNEE AMPUTATION ;  Surgeon: Serafina Mitchell, MD;  Location: [facility];  Service: Vascular;  Laterality: Left;  . BACK SURGERY    . BIOPSY N/A 09/03/2012   Procedure: BIOPSY;  Surgeon: Daneil Dolin, MD;  [facility];  Service: Endoscopy;  Laterality: N/A;  gastric and gastric mucosa  . BIOPSY N/A 12/03/2012   Procedure: BIOPSY;  Surgeon: Daneil Dolin, MD;  [facility];  Service: Endoscopy;  Laterality: N/A;  . COLONOSCOPY WITH PROPOFOL N/A 09/03/2012   EOF:[id number] polyp-removed as outlined above. Prominent internal hemorrhoids. Tubular adenoma  . ESOPHAGOGASTRODUODENOSCOPY (EGD) WITH PROPOFOL N/A 09/03/2012   ITG:PQDIYM hernia. Gastric diverticulum. Gastric ulcers with associated erosions. Duodenal erosions. Status post gastric biopsy. H.PYLORI gastritis   .  ESOPHAGOGASTRODUODENOSCOPY (EGD) WITH PROPOFOL N/A 12/03/2012   Dr. Gala Romney: gastric diverticulum, gastric erosions and scar. Previously noted gastric ulcer completed healed. Biopsy without H.pylori.   Marland Kitchen LIVER BIOPSY  2005   Done in [place], [place]. Chronic hepatitis with mild periportal inflammation, lobular unicellular necrosis and portal fibrosis. Grade 2, stage 1-2.  Marland Kitchen MAXIMUM ACCESS (MAS)POSTERIOR LUMBAR INTERBODY FUSION (PLIF) 1 LEVEL N/A 11/25/2013   Procedure: FOR MAXIMUM ACCESS (MAS) POSTERIOR LUMBAR INTERBODY FUSION (PLIF) 1 LEVEL;  Surgeon: Eustace Moore, MD;  Location: [facility];  Service: Neurosurgery;  Laterality: N/A;  FOR MAXIMUM ACCESS (MAS) POSTERIOR LUMBAR INTERBODY FUSION (PLIF) 1 LEVEL LUMBAR 3-4  . PERIPHERAL VASCULAR CATHETERIZATION Left 08/01/2015   Procedure: Lower Extremity Angiography;  Surgeon: Serafina Mitchell, MD;  Location: [facility];  Service: Cardiovascular;  Laterality: Left;  . PERIPHERAL VASCULAR CATHETERIZATION N/A 08/01/2015   Procedure: Abdominal Aortogram;  Surgeon: Serafina Mitchell, MD;  Location: [facility];  Service: Cardiovascular;  Laterality: N/A;  . PERIPHERAL VASCULAR CATHETERIZATION N/A 08/08/2015   Procedure: Abdominal Aortogram w/Lower Extremity;  Surgeon: Serafina Mitchell, MD;  Location: [facility];  Service: Cardiovascular;  Laterality: N/A;  . PERIPHERAL VASCULAR CATHETERIZATION Left 08/08/2015   Procedure: Peripheral Vascular Balloon Angioplasty;  Surgeon: Serafina Mitchell, MD;  Location: [facility];  Service: Cardiovascular;  Laterality: Left;  left popiteal  artery, left peronealtrunk, left post tibial  . POLYPECTOMY N/A 09/03/2012   Procedure: POLYPECTOMY;  Surgeon: Daneil Dolin, MD;  [facility];  Service: Endoscopy;  Laterality: N/A;  cecal polyp  . TONSILLECTOMY      Social History   Social History  . Marital status: Divorced    Spouse name: N/A  . Number of children: 1  . Years of education: N/A    Occupational History  . trying to get disability    Social History Main Topics  . Smoking status: Former Smoker    Packs/day: 1.00    Years: 47.00    Types: Cigarettes    Quit date: 07/13/2015  . Smokeless tobacco: Never Used  . Alcohol use 0.0 oz/week     Comment: 08/15/2015 "stopped drinking 3-4 years ago"  . Drug use:     Types: Cocaine     Comment: 08/15/2015 "last used cocaine 3-4 months ago"  . Sexual activity: No   Other Topics Concern  . Not on file   Social History Narrative  . No narrative on file   Family History  Problem Relation Age of Onset  . Breast cancer Mother   . Heart failure Mother   . Diabetes Father   . Hypertension Father   . Heart disease Father   . Hyperlipidemia Father   . Diabetes Sister   . Hypertension Sister   . Breast cancer Sister   . Colon cancer Neg Hx   . Liver disease Neg Hx       VITAL SIGNS BP 136/71   Pulse 94   Temp 97.5 F (36.4 C) (Oral)   Resp 20   Ht 5\' 10"  (1.778 m)   Wt 191 lb (86.6 kg)   SpO2 98%   BMI 27.41 kg/m   Patient's Medications  New Prescriptions   No medications on file  Previous Medications   ACIDOPHILUS (RISAQUAD) CAPS CAPSULE    Take 2 capsules by mouth daily.   AMINO ACIDS-PROTEIN HYDROLYS (FEEDING SUPPLEMENT, PRO-STAT SUGAR FREE 64,) LIQD    Take 90 mLs by mouth 2 (two) times daily.   ASPIRIN EC 81 MG TABLET    Take 1 tablet (81 mg total) by mouth daily.   CARVEDILOL (COREG) 3.125 MG TABLET    Take 1 tablet (3.125 mg total) by mouth 2 (two) times daily with a meal.   FAMOTIDINE (PEPCID) 20 MG TABLET    Take 1 tablet (20 mg total) by mouth daily.   FOLIC ACID (FOLVITE) 1 MG TABLET    Take 1 tablet (1 mg total) by mouth daily.   GABAPENTIN (NEURONTIN) 600 MG TABLET    Take 600 mg by mouth 3 (three) times daily.   INSULIN ASPART (NOVOLOG) 100 UNIT/ML INJECTION    Inject 3 Units into the skin 3 (three) times daily before meals.   INSULIN GLARGINE (LANTUS) 100 UNIT/ML INJECTION    Inject 24 Units  into the skin at bedtime.    METFORMIN (GLUCOPHAGE) 500 MG TABLET    Take 500 mg by mouth daily with breakfast.   MULTIPLE VITAMIN (MULTIVITAMIN WITH MINERALS) TABS TABLET    Take 1 tablet by mouth daily.   MULTIPLE VITAMINS-MINERALS (DECUBI-VITE PO)    Take 1 capsule by mouth daily.   OXYCODONE-ACETAMINOPHEN (PERCOCET/ROXICET) 5-325 MG TABLET    Take 1-2 tablets by mouth every 4 (four) hours as needed for severe pain.   POLYETHYLENE GLYCOL (MIRALAX / GLYCOLAX) PACKET    Take 17 g by mouth  daily.   SENNA-DOCUSATE (SENOKOT-S) 8.6-50 MG TABLET    Take 2 tablets by mouth 2 (two) times daily.   SIMVASTATIN (ZOCOR) 20 MG TABLET    Take 20 mg by mouth daily at 6 PM.   TAMSULOSIN (FLOMAX) 0.4 MG CAPS CAPSULE    Take 1 capsule (0.4 mg total) by mouth daily.   THIAMINE 100 MG TABLET    Take 1 tablet (100 mg total) by mouth daily.   TORSEMIDE (DEMADEX) 20 MG TABLET    Take 20 mg by mouth daily.   Modified Medications   No medications on file  Discontinued Medications   No medications on file     SIGNIFICANT DIAGNOSTIC EXAMS  08-03-15: TEE: - Left ventricle: Septal and apical hypokinesis. The cavity size was mildly dilated. Wall thickness was increased in a pattern of moderate LVH. Systolic function was mildly reduced. The estimated ejection fraction was in the range of 45% to 50%. Wall motion was normal; there were no regional wall motion abnormalities. Left ventricular diastolic function parameters were normal. - Aortic valve: Severely calcified non coronary cusp. - Mitral valve: Calcified annulus. Mildly thickened leaflets . - Atrial septum: No defect or patent foramen ovale was identified.  09-05-15: chest x-ray: No acute cardiopulmonary disease.    LABS REVIEWED:   08-31-15: wbc 10.7; hgb 8.7; hct 27.5; mcv 86.8; plt 453; glucose 163; bun 32; creat 1.42; k+4.1; na++132; liver normal albumin 2.6 08-26-15: wbc 9.1; hgb 8.9; hct 28.5; mcv 86.6; plt 572; glucose 140; bun 26; creat 1.18; k+ 4.4;  na++132 09-01-15: wbc 8.0; hgb 8.7; hct 27.6;mcv 87.9; plt 456; glucose 128; bun 33; creat 1.26; k+ 5.5; na++ 134 09-04-15: glucose 117; bun 24; creat 1.02; k+ 4.7; na++ 134  09-18-15: glucose 166; bun 10.4; creat 0.93; k+ 4.1; na++ 140  10-09-15:wbc 8.6; hgb 7.1; hct 23.7; mcv 84.7; plt 364; glucose 328; bun 12.6; creat 1.05; k+ 4.6; na++ 134 10-21-15: wbc 10.5; hgb 7.1; hct 24.2 ; mcv 82.0; plt 483;  10-28-15: wbc 9.0; hgb 7.4; hct 23.6; mcv 79.7; plt 538    Review of Systems  Constitutional: Negative for malaise/fatigue.  Respiratory: Negative for cough and shortness of breath.   Cardiovascular: Negative for chest pain, palpitations has right lower extremity edema  Gastrointestinal: Negative for abdominal pain, constipation and heartburn.  Musculoskeletal: Negative for back pain, joint pain and myalgias.  Skin: Negative.   Neurological: Negative for dizziness.  Psychiatric/Behavioral: The patient is not nervous/anxious.     Physical Exam  Constitutional: No distress.  Eyes: Conjunctivae are normal.  Neck: Neck supple. No JVD present. No thyromegaly present.  Cardiovascular: Normal rate, regular rhythm and intact distal pulses.   Respiratory: Effort normal and breath sounds normal. No respiratory distress. He has no wheezes.  GI: Soft. Bowel sounds are normal. He exhibits no distension. There is no tenderness.  Musculoskeletal: 1+ right lower extremity edema   Able to move all extremities  Status post left bka  Lymphadenopathy:    He has no cervical adenopathy.  Neurological: He is alert.  Skin: Skin is warm and dry. He is not diaphoretic.   Psychiatric: He has a normal mood and affect.     ASSESSMENT/ PLAN:  1. Hypertension: will continue coreg 3.125 mg twice daily asa 81 mg daily  2. PUD: will continue pepcid 20 mg daily  3. Chronic hepatitis C: is followed by GI: will monitor  4. Diabetes: will continue lantus 24 units nightly and novolog 3 units with meals; metformin  500  mg daily  5. Dyslipidemia: will continue zocor 20 mg daily  6. BPH: will continue flomax daily   7. Constipation: will continue miralax daily and senna s 2 tabs twice daily   8.  PVD: is status post left bka; will continue percoet 5/325 mg 1 or 2 tabs every 4 hours as needed  9. Phantom limb pain: will continue neurontin 600 mg three times daily   10. CKD stage III: bun 12.6; creat 1.05; will monitor  11.  CVA: neurologically stable will continue asa 81 mg daily   12. Anemia of chronic disease: will continue to monitor hgb 7.4    MD is aware of resident's narcotic use and is in agreement with current plan of care. We will attempt to wean resident as appropriate.     Ok Edwards NP [facility]  Contact [phone] Monday through Friday 8am- 5pm  After hours call [phone]

## 2015-11-25 ENCOUNTER — Encounter (HOSPITAL_COMMUNITY): Payer: Self-pay | Admitting: Emergency Medicine

## 2015-11-25 ENCOUNTER — Emergency Department (HOSPITAL_COMMUNITY): Payer: Medicaid Other

## 2015-11-25 ENCOUNTER — Observation Stay (HOSPITAL_COMMUNITY)
Admission: EM | Admit: 2015-11-25 | Discharge: 2015-11-27 | Disposition: A | Discharge status: Home / Self Care | Payer: Medicaid Other | Attending: Family Medicine | Admitting: Family Medicine

## 2015-11-25 DIAGNOSIS — D638 Anemia in other chronic diseases classified elsewhere: Secondary | ICD-10-CM | POA: Diagnosis present

## 2015-11-25 DIAGNOSIS — D6489 Other specified anemias: Secondary | ICD-10-CM | POA: Diagnosis not present

## 2015-11-25 DIAGNOSIS — Z794 Long term (current) use of insulin: Secondary | ICD-10-CM | POA: Insufficient documentation

## 2015-11-25 DIAGNOSIS — I739 Peripheral vascular disease, unspecified: Secondary | ICD-10-CM | POA: Diagnosis present

## 2015-11-25 DIAGNOSIS — L97509 Non-pressure chronic ulcer of other part of unspecified foot with unspecified severity: Secondary | ICD-10-CM

## 2015-11-25 DIAGNOSIS — Z87891 Personal history of nicotine dependence: Secondary | ICD-10-CM | POA: Diagnosis not present

## 2015-11-25 DIAGNOSIS — N183 Chronic kidney disease, stage 3 unspecified: Secondary | ICD-10-CM | POA: Diagnosis present

## 2015-11-25 DIAGNOSIS — Z8673 Personal history of transient ischemic attack (TIA), and cerebral infarction without residual deficits: Secondary | ICD-10-CM

## 2015-11-25 DIAGNOSIS — Z7982 Long term (current) use of aspirin: Secondary | ICD-10-CM | POA: Diagnosis not present

## 2015-11-25 DIAGNOSIS — B182 Chronic viral hepatitis C: Secondary | ICD-10-CM | POA: Diagnosis present

## 2015-11-25 DIAGNOSIS — I10 Essential (primary) hypertension: Secondary | ICD-10-CM | POA: Diagnosis present

## 2015-11-25 DIAGNOSIS — M542 Cervicalgia: Secondary | ICD-10-CM

## 2015-11-25 DIAGNOSIS — D649 Anemia, unspecified: Secondary | ICD-10-CM | POA: Diagnosis present

## 2015-11-25 DIAGNOSIS — Z79899 Other long term (current) drug therapy: Secondary | ICD-10-CM | POA: Diagnosis not present

## 2015-11-25 DIAGNOSIS — R739 Hyperglycemia, unspecified: Secondary | ICD-10-CM

## 2015-11-25 DIAGNOSIS — E119 Type 2 diabetes mellitus without complications: Secondary | ICD-10-CM | POA: Diagnosis not present

## 2015-11-25 DIAGNOSIS — R059 Cough, unspecified: Secondary | ICD-10-CM | POA: Diagnosis present

## 2015-11-25 DIAGNOSIS — S88112A Complete traumatic amputation at level between knee and ankle, left lower leg, initial encounter: Secondary | ICD-10-CM

## 2015-11-25 DIAGNOSIS — J45909 Unspecified asthma, uncomplicated: Secondary | ICD-10-CM | POA: Insufficient documentation

## 2015-11-25 DIAGNOSIS — E871 Hypo-osmolality and hyponatremia: Secondary | ICD-10-CM | POA: Diagnosis present

## 2015-11-25 DIAGNOSIS — E11621 Type 2 diabetes mellitus with foot ulcer: Secondary | ICD-10-CM | POA: Diagnosis present

## 2015-11-25 DIAGNOSIS — R05 Cough: Secondary | ICD-10-CM | POA: Diagnosis present

## 2015-11-25 LAB — BASIC METABOLIC PANEL
Anion gap: 8 (ref 5–15)
BUN: 14 mg/dL (ref 6–20)
CALCIUM: 8.7 mg/dL — AB (ref 8.9–10.3)
CO2: 27 mmol/L (ref 22–32)
CREATININE: 1.06 mg/dL (ref 0.61–1.24)
Chloride: 94 mmol/L — ABNORMAL LOW (ref 101–111)
GFR calc Af Amer: >60 mL/min (ref >60)
GFR calc non Af Amer: >60 mL/min (ref >60)
GLUCOSE: 397 mg/dL — AB (ref 65–99)
Potassium: 4.1 mmol/L (ref 3.5–5.1)
Sodium: 129 mmol/L — ABNORMAL LOW (ref 135–145)

## 2015-11-25 LAB — CBC WITH DIFFERENTIAL/PLATELET
Basophils Absolute: 0 10*3/uL (ref 0.0–0.1)
Basophils Relative: 0 %
EOS PCT: 3 %
Eosinophils Absolute: 0.3 10*3/uL (ref 0.0–0.7)
HEMATOCRIT: 20.6 % — AB (ref 39.0–52.0)
Hemoglobin: 6.3 g/dL — CL (ref 13.0–17.0)
LYMPHS ABS: 2.1 10*3/uL (ref 0.7–4.0)
Lymphocytes Relative: 24 %
MCH: 23.3 pg — ABNORMAL LOW (ref 26.0–34.0)
MCHC: 30.6 g/dL (ref 30.0–36.0)
MCV: 76.3 fL — AB (ref 78.0–100.0)
MONOS PCT: 8 %
Monocytes Absolute: 0.7 10*3/uL (ref 0.1–1.0)
NEUTROS ABS: 5.8 10*3/uL (ref 1.7–7.7)
Neutrophils Relative %: 65 %
Platelets: 462 10*3/uL — ABNORMAL HIGH (ref 150–400)
RBC: 2.7 MIL/uL — ABNORMAL LOW (ref 4.22–5.81)
RDW: 14.6 % (ref 11.5–15.5)
WBC: 8.9 10*3/uL (ref 4.0–10.5)

## 2015-11-25 NOTE — ED Provider Notes (Signed)
[facility] DEPT Provider Note   CSN: [id number] Arrival date & time: 11/25/15  2200  By signing my name below, I, Dolores Hoose, attest that this documentation has been prepared under the direction and in the presence of non-physician practitioner, Delsa Grana, PA-C. Electronically Signed: Dolores Hoose, Scribe. 11/25/2015. 10:55 PM.  History   Chief Complaint Chief Complaint  Patient presents with  . Cough  . Neck Pain   HPI  HPI Comments:  Cory Weber is a 65 y.o. male with PMHx of HTN, DM, and HLD who presents to the Emergency Department complaining of sudden-onset constant unchanged neck pain occurring 4 days ago. Pt describes his pain as being located in the back left of his neck, and that it is exacerbated by coughing.  Pt notes that he had a flu shot a few days ago and has been coughing up yellow phlegm and "black stuff" since the vaccination. Pt notes that the "black stuff" could be from the soda he has been drinking. He also has been having a few episodes of diaphoresis since onset of his other symptoms. He denies any trouble breathing, trouble swallowing, sore throat, numbness in his extremities, ear pain, vomiting, hematemesis, nasal congestion, rhinorrhea, SOB, wheezing, chest pain or fevers. He is compliant with his daily medications for his DM, HTN and HLD.  Patient comes to the ER from a rehabilitation and nursing facility where he's been since January when he had a left BKA after presenting to the ER with gangrene and maggots on his LLE.  He states he's mainly in a wheelchair but does PT and OT at the facility, however recently he has been extremely fatigued and weak, causing him to stop earlier with his therapy. He states his diabetes is well controlled with insulin and oral medications, reports that her sugars are usually "low in the morning, around 179." No other acute or associated complaints.  Past Medical History:  Diagnosis Date  . Anemia   . Asthma   .  Cellulitis and abscess of foot 07/2015  . CVA (cerebral vascular accident) ([facility]) 2015   denies residual on 08/15/2015  . Hepatitis C    states he was diagnosed in 2007 or 2007 while living in [place], [place]  . Hyperlipidemia   . Hypertension   . Peripheral neuropathy ([place])   . Pneumonia X 1  . PVD (peripheral vascular disease) ([place])   . Type II diabetes mellitus [facility])     Patient Active Problem List   Diagnosis Date Noted  . Edema of right lower extremity 10/10/2015  . GERD (gastroesophageal reflux disease) 10/08/2015  . Pyrexia   . Amputation of left lower extremity below knee ([place]) 08/21/2015  . Abnormality of gait   . Phantom limb pain ([place])   . Anemia of chronic disease   . DM type 2 with diabetic peripheral neuropathy ([facility])   . HLD (hyperlipidemia)   . BPH (benign prostatic hyperplasia)   . ETOH abuse   . CKD (chronic kidney disease)   . PVD (peripheral vascular disease) ([place])   . Chronic hepatitis C without hepatic coma ([place])   . History of CVA (cerebrovascular accident) without residual deficits   . Leukocytosis   . SIRS (systemic inflammatory response syndrome) ([facility])   . Tachycardia   . Hyponatremia   . Diabetic wet gangrene of the foot ([place]) 08/15/2015  . AKI (acute kidney injury) ([place])   . Atherosclerosis of left lower extremity with ulceration of midfoot ([place])   . Left foot  infection 07/23/2015  . Type 2 diabetes mellitus with vascular disease ([place]) 07/23/2015  . Diabetes mellitus with foot ulcer, without long-term current use of insulin ([place]) 07/18/2015  . Chronic kidney disease, stage 3 07/18/2015  . Essential hypertension 07/18/2015  . Acute blood loss anemia 12/01/2013  . Constipation 12/01/2013  . Radiculopathy of lumbar region 11/29/2013  . Acute right lumbar radiculopathy 11/26/2013  . S/P lumbar spinal fusion 11/25/2013  . Lumbar spinal stenosis 11/24/2013  . Right leg weakness 11/23/2013  . PUD (peptic ulcer disease) 11/19/2012  . Unspecified  constipation 05/13/2012  . Normocytic anemia 12/03/2011  . Melena 12/03/2011  . DM (diabetes mellitus), type 2, uncontrolled ([place]) 02/03/2011  . Accelerated hypertension 02/03/2011  . Hemiparesthesia 02/03/2011  . Polysubstance abuse 02/03/2011  . Tobacco abuse 02/03/2011    Past Surgical History:  Procedure Laterality Date  . AMPUTATION Left 08/16/2015   Procedure: LEFT BELOW THE KNEE AMPUTATION ;  Surgeon: Serafina Mitchell, MD;  Location: [facility];  Service: Vascular;  Laterality: Left;  . BACK SURGERY    . BIOPSY N/A 09/03/2012   Procedure: BIOPSY;  Surgeon: Daneil Dolin, MD;  Location: [facility];  Service: Endoscopy;  Laterality: N/A;  gastric and gastric mucosa  . BIOPSY N/A 12/03/2012   Procedure: BIOPSY;  Surgeon: Daneil Dolin, MD;  Location: [facility];  Service: Endoscopy;  Laterality: N/A;  . COLONOSCOPY WITH PROPOFOL N/A 09/03/2012   KNL:ZJQBHAL polyp-removed as outlined above. Prominent internal hemorrhoids. Tubular adenoma  . ESOPHAGOGASTRODUODENOSCOPY (EGD) WITH PROPOFOL N/A 09/03/2012   PFX:TKWIOX hernia. Gastric diverticulum. Gastric ulcers with associated erosions. Duodenal erosions. Status post gastric biopsy. H.PYLORI gastritis   . ESOPHAGOGASTRODUODENOSCOPY (EGD) WITH PROPOFOL N/A 12/03/2012   Dr. Gala Romney: gastric diverticulum, gastric erosions and scar. Previously noted gastric ulcer completed healed. Biopsy without H.pylori.   Marland Kitchen LIVER BIOPSY  2005   Done in [place], [place]. Chronic hepatitis with mild periportal inflammation, lobular unicellular necrosis and portal fibrosis. Grade 2, stage 1-2.  Marland Kitchen MAXIMUM ACCESS (MAS)POSTERIOR LUMBAR INTERBODY FUSION (PLIF) 1 LEVEL N/A 11/25/2013   Procedure: FOR MAXIMUM ACCESS (MAS) POSTERIOR LUMBAR INTERBODY FUSION (PLIF) 1 LEVEL;  Surgeon: Eustace Moore, MD;  Location: [facility];  Service: Neurosurgery;  Laterality: N/A;  FOR MAXIMUM ACCESS (MAS) POSTERIOR LUMBAR INTERBODY FUSION (PLIF) 1 LEVEL LUMBAR 3-4  . PERIPHERAL VASCULAR  CATHETERIZATION Left 08/01/2015   Procedure: Lower Extremity Angiography;  Surgeon: Serafina Mitchell, MD;  Location: [facility];  Service: Cardiovascular;  Laterality: Left;  . PERIPHERAL VASCULAR CATHETERIZATION N/A 08/01/2015   Procedure: Abdominal Aortogram;  Surgeon: Serafina Mitchell, MD;  Location: [facility];  Service: Cardiovascular;  Laterality: N/A;  . PERIPHERAL VASCULAR CATHETERIZATION N/A 08/08/2015   Procedure: Abdominal Aortogram w/Lower Extremity;  Surgeon: Serafina Mitchell, MD;  Location: [facility];  Service: Cardiovascular;  Laterality: N/A;  . PERIPHERAL VASCULAR CATHETERIZATION Left 08/08/2015   Procedure: Peripheral Vascular Balloon Angioplasty;  Surgeon: Serafina Mitchell, MD;  Location: [facility];  Service: Cardiovascular;  Laterality: Left;  left popiteal artery, left peronealtrunk, left post tibial  . POLYPECTOMY N/A 09/03/2012   Procedure: POLYPECTOMY;  Surgeon: Daneil Dolin, MD;  Location: [facility];  Service: Endoscopy;  Laterality: N/A;  cecal polyp  . TONSILLECTOMY         Home Medications    Prior to Admission medications   Medication Sig Start Date End Date Taking? Authorizing Provider  acidophilus (RISAQUAD) CAPS capsule Take 2 capsules  by mouth daily. 08/21/15   Clanford Marisa Hua, MD  Amino Acids-Protein Hydrolys (FEEDING SUPPLEMENT, PRO-STAT SUGAR FREE 64,) LIQD Take 90 mLs by mouth 2 (two) times daily.    Historical Provider, MD  aspirin EC 81 MG tablet Take 1 tablet (81 mg total) by mouth daily. 07/20/15   Orvan Falconer, MD  carvedilol (COREG) 3.125 MG tablet Take 1 tablet (3.125 mg total) by mouth 2 (two) times daily with a meal. 08/10/15   Maryann Mikhail, DO  famotidine (PEPCID) 20 MG tablet Take 1 tablet (20 mg total) by mouth daily. 08/10/15   Maryann Mikhail, DO  folic acid (FOLVITE) 1 MG tablet Take 1 tablet (1 mg total) by mouth daily. 08/10/15   Maryann Mikhail, DO  gabapentin (NEURONTIN) 600 MG tablet Take 600 mg by mouth 3 (three)  times daily.    Historical Provider, MD  insulin aspart (NOVOLOG) 100 UNIT/ML injection Inject 3 Units into the skin 3 (three) times daily before meals.    Historical Provider, MD  insulin glargine (LANTUS) 100 UNIT/ML injection Inject 24 Units into the skin at bedtime.     Historical Provider, MD  metFORMIN (GLUCOPHAGE) 500 MG tablet Take 500 mg by mouth daily with breakfast.    Historical Provider, MD  Multiple Vitamin (MULTIVITAMIN WITH MINERALS) TABS tablet Take 1 tablet by mouth daily. 08/10/15   Maryann Mikhail, DO  Multiple Vitamins-Minerals (DECUBI-VITE PO) Take 1 capsule by mouth daily.    Historical Provider, MD  oxyCODONE-acetaminophen (PERCOCET/ROXICET) 5-325 MG tablet Take 1-2 tablets by mouth every 4 (four) hours as needed for severe pain. 10/11/15   Gildardo Cranker, DO  polyethylene glycol (MIRALAX / GLYCOLAX) packet Take 17 g by mouth daily. 08/21/15   Clanford Marisa Hua, MD  senna-docusate (SENOKOT-S) 8.6-50 MG tablet Take 2 tablets by mouth 2 (two) times daily. 08/21/15   Clanford Marisa Hua, MD  simvastatin (ZOCOR) 20 MG tablet Take 20 mg by mouth daily at 6 PM.    Historical Provider, MD  tamsulosin (FLOMAX) 0.4 MG CAPS capsule Take 1 capsule (0.4 mg total) by mouth daily. 08/10/15   Maryann Mikhail, DO  thiamine 100 MG tablet Take 1 tablet (100 mg total) by mouth daily. 08/10/15   Maryann Mikhail, DO  torsemide (DEMADEX) 20 MG tablet Take 20 mg by mouth daily.     Historical Provider, MD    Family History Family History  Problem Relation Age of Onset  . Breast cancer Mother   . Heart failure Mother   . Diabetes Father   . Hypertension Father   . Heart disease Father   . Hyperlipidemia Father   . Diabetes Sister   . Hypertension Sister   . Breast cancer Sister   . Colon cancer Neg Hx   . Liver disease Neg Hx     Social History Social History  Substance Use Topics  . Smoking status: Former Smoker    Packs/day: 1.00    Years: 47.00    Types: Cigarettes    Quit date:  07/13/2015  . Smokeless tobacco: Never Used  . Alcohol use 0.0 oz/week     Comment: 08/15/2015 "stopped drinking 3-4 years ago"     Allergies   Review of patient's allergies indicates no known allergies.   Review of Systems Review of Systems  All other systems reviewed and are negative.    Physical Exam Updated Vital Signs BP 163/89 (BP Location: Left Arm)   Pulse 99   Temp 99.5 F (37.5 C) (Oral)  Resp 18   SpO2 96%   Physical Exam  Constitutional: He is oriented to person, place, and time. He appears well-developed and well-nourished. No distress.  HENT:  Head: Normocephalic and atraumatic.  Right Ear: External ear normal.  Left Ear: External ear normal.  Nose: Nose normal.  Mouth/Throat: Oropharynx is clear and moist. No oropharyngeal exudate.  Pallor  Eyes: EOM are normal. Pupils are equal, round, and reactive to light. Right eye exhibits no discharge. Left eye exhibits no discharge. No scleral icterus.  [place] conjunctival pallor  Neck: Normal range of motion. Neck supple. No tracheal deviation present.  No occipital pre/post lymphadenopathy. No cervical midline tenderness to palpation. Left paraspinal and trapezius muscle tenderness to palpation. No neck asymmetry.   Cardiovascular: Normal rate, regular rhythm, normal heart sounds and intact distal pulses.  Exam reveals no gallop and no friction rub.   No murmur heard. Pulmonary/Chest: Effort normal and breath sounds normal. No stridor. No respiratory distress. He has no wheezes. He has no rales. He exhibits no tenderness.  Occasional dry cough  Abdominal: Soft. Bowel sounds are normal. He exhibits no distension and no mass. There is no tenderness. There is no guarding.  Lymphadenopathy:    He has no cervical adenopathy.  Neurological: He is alert and oriented to person, place, and time.  Skin: Skin is warm and dry. He is not diaphoretic.  Psychiatric: He has a normal mood and affect.  Nursing note and vitals  reviewed.    ED Treatments / Results  DIAGNOSTIC STUDIES:  Oxygen Saturation is 96% on RA, normal by my interpretation.    COORDINATION OF CARE:  11:06 PM Discussed treatment plan with pt at bedside which includes blood work and imaging and pt agreed to plan.  Labs (all labs ordered are listed, but only abnormal results are displayed) Labs Reviewed - No data to display  EKG  EKG Interpretation None       Radiology No results found.  Procedures Procedures (including critical care time)  Medications Ordered in ED Medications  sodium chloride 0.9 % bolus 1,000 mL (0 mLs Intravenous Stopped 11/26/15 0305)  insulin aspart (novoLOG) injection 10 Units ( Subcutaneous Given 11/26/15 0150)  iopamidol (ISOVUE-300) 61 % injection 75 mL (75 mLs Intravenous Contrast Given 11/26/15 0140)     Initial Impression / Assessment and Plan / ED Course  I have reviewed the triage vital signs and the nursing notes.  Pertinent labs & imaging results that were available during my care of the patient were reviewed by me and considered in my medical decision making (see chart for details).  Clinical Course  Patient is a 65 year old male with multiple medical issues, chief complaint today is left neck pain and cough with productive sputum that is yellow, green and sometimes black. He denies hematemesis, vomiting, nausea.  He reports overall compliance to his medications however he generally appears very pale and chronically ill.  On exam his neck pain was reproducible with palpation of left lateral neck muscles, no concern for meningitis. Obtain chest x-ray for patients complaint cough, this was negative.  Basic labs are obtained given patient's  medical history and past admissions with poor outcomes due to poor compliance.  Basic labs were significant for hyperglycemia, thrombocytosis, and Hb of 6.3, dropped from his baseline which appears to be around 8.8.  Discussed case with attending physician  who advised CT chest w/ contrast given pt's complaint of black sputum.  Type and screen and PRBC's ordered.  Discussed lab  results with the patient stated that he has received blood transfusions multiple times in the past with negative workups as to its etiology.  Occult blood was negative.  Patient was given a dose of insulin for his hyperglycemia and fluids. CT chest was significant for cholesterol nodules and the patient agrees and right lung apices compatible with bronchiolitis, moderate coronary artery calcifications, no other acute cardio/pulmonary findings.  Admitted to hospitalist for further workup and treatment of symptomatic anemia, Hb 6.3 - receiving PRBC, and hyperglycemia.     CRITICAL CARE Performed by: Delsa Grana Total critical care time: 30 Critical care time was exclusive of separately billable procedures and treating other patients. Critical care was necessary to treat or prevent imminent or life-threatening deterioration. Critical care was time spent personally by me on the following activities: development of treatment plan with patient and/or surrogate as well as nursing, discussions with consultants, evaluation of patient's response to treatment, examination of patient, obtaining history from patient or surrogate, ordering and performing treatments and interventions, ordering and review of laboratory studies, ordering and review of radiographic studies, pulse oximetry and re-evaluation of patient's condition.   Final Clinical Impressions(s) / ED Diagnoses   Final diagnoses:  Neck pain on left side  Anemia, unspecified type  Hyperglycemia    New Prescriptions New Prescriptions   No medications on file   I personally performed the services described in this documentation, which was scribed in my presence. The recorded information has been reviewed and is accurate.      Delsa Grana, PA-C 12/01/15 2157    Isla Pence, MD 12/02/15 [phone]

## 2015-11-25 NOTE — ED Triage Notes (Signed)
Brought in by EMS from [facility] facility at [facility] with c/o cough and neck pain.  Pt reported that he has been having persistent productive cough and left-side neck pain for 4 days now--- symptoms started after he got his flu shot few days ago.   Pt denies chest pain or shortness of breath.

## 2015-11-25 NOTE — ED Notes (Signed)
Bed: WZ92 Expected date:  Expected time:  Means of arrival:  Comments: 67 m cough neck pain

## 2015-11-26 ENCOUNTER — Emergency Department (HOSPITAL_COMMUNITY): Payer: Medicaid Other

## 2015-11-26 ENCOUNTER — Encounter (HOSPITAL_COMMUNITY): Payer: Self-pay | Admitting: Radiology

## 2015-11-26 DIAGNOSIS — D649 Anemia, unspecified: Secondary | ICD-10-CM | POA: Diagnosis present

## 2015-11-26 DIAGNOSIS — B182 Chronic viral hepatitis C: Secondary | ICD-10-CM

## 2015-11-26 DIAGNOSIS — Z89512 Acquired absence of left leg below knee: Secondary | ICD-10-CM

## 2015-11-26 DIAGNOSIS — E871 Hypo-osmolality and hyponatremia: Secondary | ICD-10-CM

## 2015-11-26 DIAGNOSIS — R059 Cough, unspecified: Secondary | ICD-10-CM | POA: Diagnosis present

## 2015-11-26 DIAGNOSIS — N183 Chronic kidney disease, stage 3 (moderate): Secondary | ICD-10-CM

## 2015-11-26 DIAGNOSIS — Z8673 Personal history of transient ischemic attack (TIA), and cerebral infarction without residual deficits: Secondary | ICD-10-CM

## 2015-11-26 DIAGNOSIS — D638 Anemia in other chronic diseases classified elsewhere: Secondary | ICD-10-CM

## 2015-11-26 DIAGNOSIS — R05 Cough: Secondary | ICD-10-CM

## 2015-11-26 DIAGNOSIS — I739 Peripheral vascular disease, unspecified: Secondary | ICD-10-CM

## 2015-11-26 DIAGNOSIS — L97509 Non-pressure chronic ulcer of other part of unspecified foot with unspecified severity: Secondary | ICD-10-CM

## 2015-11-26 DIAGNOSIS — E11621 Type 2 diabetes mellitus with foot ulcer: Secondary | ICD-10-CM

## 2015-11-26 DIAGNOSIS — I10 Essential (primary) hypertension: Secondary | ICD-10-CM

## 2015-11-26 LAB — CBC WITH DIFFERENTIAL/PLATELET
Basophils Absolute: 0 10*3/uL (ref 0.0–0.1)
Basophils Relative: 0 %
EOS ABS: 0.2 10*3/uL (ref 0.0–0.7)
Eosinophils Relative: 2 %
HEMATOCRIT: 26.8 % — AB (ref 39.0–52.0)
HEMOGLOBIN: 8.4 g/dL — AB (ref 13.0–17.0)
LYMPHS ABS: 2.2 10*3/uL (ref 0.7–4.0)
LYMPHS PCT: 23 %
MCH: 23.9 pg — AB (ref 26.0–34.0)
MCHC: 31.3 g/dL (ref 30.0–36.0)
MCV: 76.4 fL — AB (ref 78.0–100.0)
MONOS PCT: 8 %
Monocytes Absolute: 0.8 10*3/uL (ref 0.1–1.0)
NEUTROS ABS: 6.4 10*3/uL (ref 1.7–7.7)
NEUTROS PCT: 67 %
Platelets: 481 10*3/uL — ABNORMAL HIGH (ref 150–400)
RBC: 3.51 MIL/uL — AB (ref 4.22–5.81)
RDW: 14.7 % (ref 11.5–15.5)
WBC: 9.6 10*3/uL (ref 4.0–10.5)

## 2015-11-26 LAB — RESPIRATORY PANEL BY PCR
ADENOVIRUS-RVPPCR: NOT DETECTED
Bordetella pertussis: NOT DETECTED
CORONAVIRUS 229E-RVPPCR: NOT DETECTED
CORONAVIRUS HKU1-RVPPCR: NOT DETECTED
CORONAVIRUS NL63-RVPPCR: NOT DETECTED
CORONAVIRUS OC43-RVPPCR: NOT DETECTED
Chlamydophila pneumoniae: NOT DETECTED
Influenza A: NOT DETECTED
Influenza B: NOT DETECTED
METAPNEUMOVIRUS-RVPPCR: NOT DETECTED
Mycoplasma pneumoniae: NOT DETECTED
PARAINFLUENZA VIRUS 1-RVPPCR: NOT DETECTED
PARAINFLUENZA VIRUS 2-RVPPCR: NOT DETECTED
PARAINFLUENZA VIRUS 3-RVPPCR: NOT DETECTED
Parainfluenza Virus 4: NOT DETECTED
RHINOVIRUS / ENTEROVIRUS - RVPPCR: DETECTED — AB
Respiratory Syncytial Virus: NOT DETECTED

## 2015-11-26 LAB — GLUCOSE, CAPILLARY
GLUCOSE-CAPILLARY: 151 mg/dL — AB (ref 65–99)
GLUCOSE-CAPILLARY: 223 mg/dL — AB (ref 65–99)
Glucose-Capillary: 149 mg/dL — ABNORMAL HIGH (ref 65–99)
Glucose-Capillary: 186 mg/dL — ABNORMAL HIGH (ref 65–99)

## 2015-11-26 LAB — RETICULOCYTES
RBC.: 3.52 MIL/uL — ABNORMAL LOW (ref 4.22–5.81)
RETIC CT PCT: 1.5 % (ref 0.4–3.1)
Retic Count, Absolute: 52.8 10*3/uL (ref 19.0–186.0)

## 2015-11-26 LAB — BASIC METABOLIC PANEL
Anion gap: 7 (ref 5–15)
BUN: 12 mg/dL (ref 6–20)
CHLORIDE: 99 mmol/L — AB (ref 101–111)
CO2: 30 mmol/L (ref 22–32)
CREATININE: 0.97 mg/dL (ref 0.61–1.24)
Calcium: 9.4 mg/dL (ref 8.9–10.3)
GFR calc Af Amer: >60 mL/min (ref >60)
GFR calc non Af Amer: >60 mL/min (ref >60)
Glucose, Bld: 175 mg/dL — ABNORMAL HIGH (ref 65–99)
POTASSIUM: 4 mmol/L (ref 3.5–5.1)
Sodium: 136 mmol/L (ref 135–145)

## 2015-11-26 LAB — MRSA PCR SCREENING: MRSA BY PCR: POSITIVE — AB

## 2015-11-26 LAB — OSMOLALITY: OSMOLALITY: 292 mosm/kg (ref 275–295)

## 2015-11-26 LAB — PREPARE RBC (CROSSMATCH)

## 2015-11-26 LAB — ABO/RH: ABO/RH(D): O POS

## 2015-11-26 LAB — IRON AND TIBC
Iron: 67 ug/dL (ref 45–182)
SATURATION RATIOS: 15 % — AB (ref 17.9–39.5)
TIBC: 451 ug/dL — AB (ref 250–450)
UIBC: 384 ug/dL

## 2015-11-26 LAB — CBG MONITORING, ED
GLUCOSE-CAPILLARY: 212 mg/dL — AB (ref 65–99)
Glucose-Capillary: 284 mg/dL — ABNORMAL HIGH (ref 65–99)

## 2015-11-26 LAB — FERRITIN: FERRITIN: 8 ng/mL — AB (ref 24–336)

## 2015-11-26 LAB — HEMOGLOBIN AND HEMATOCRIT, BLOOD
HEMATOCRIT: 27 % — AB (ref 39.0–52.0)
HEMOGLOBIN: 8.3 g/dL — AB (ref 13.0–17.0)

## 2015-11-26 LAB — POC OCCULT BLOOD, ED: FECAL OCCULT BLD: NEGATIVE

## 2015-11-26 LAB — VITAMIN B12: Vitamin B-12: 287 pg/mL (ref 180–914)

## 2015-11-26 LAB — LACTATE DEHYDROGENASE: LDH: 149 U/L (ref 98–192)

## 2015-11-26 MED ORDER — ACETAMINOPHEN 650 MG RE SUPP: 650.0000 mg | Freq: Four times a day (QID) | RECTAL | Status: DC | PRN

## 2015-11-26 MED ORDER — IOPAMIDOL (ISOVUE-300) INJECTION 61%
75.0000 mL | Freq: Once | INTRAVENOUS | Status: AC | PRN
  Administered 2015-11-26: 75 mL via INTRAVENOUS

## 2015-11-26 MED ORDER — CARVEDILOL 3.125 MG PO TABS
3.1250 mg | ORAL_TABLET | Freq: Two times a day (BID) | ORAL | Status: DC
  Administered 2015-11-26 – 2015-11-27 (×4): 3.125 mg via ORAL
  Filled 2015-11-26 (×4): qty 1

## 2015-11-26 MED ORDER — INSULIN GLARGINE 100 UNIT/ML ~~LOC~~ SOLN
24.0000 [IU] | Freq: Every day | SUBCUTANEOUS | Status: DC
Start: 2015-11-26 — End: 2015-11-27
  Administered 2015-11-26: 24 [IU] via SUBCUTANEOUS
  Filled 2015-11-26 (×2): qty 0.24

## 2015-11-26 MED ORDER — TAMSULOSIN HCL 0.4 MG PO CAPS
0.4000 mg | ORAL_CAPSULE | Freq: Every day | ORAL | Status: DC
  Administered 2015-11-26 – 2015-11-27 (×2): 0.4 mg via ORAL
  Filled 2015-11-26 (×2): qty 1

## 2015-11-26 MED ORDER — INSULIN ASPART 100 UNIT/ML ~~LOC~~ SOLN
10.0000 [IU] | Freq: Once | SUBCUTANEOUS | Status: AC
  Administered 2015-11-26: 02:00:00 via SUBCUTANEOUS
  Filled 2015-11-26: qty 1

## 2015-11-26 MED ORDER — ASPIRIN EC 81 MG PO TBEC
81.0000 mg | DELAYED_RELEASE_TABLET | Freq: Every day | ORAL | Status: DC
  Administered 2015-11-26 – 2015-11-27 (×2): 81 mg via ORAL
  Filled 2015-11-26 (×2): qty 1

## 2015-11-26 MED ORDER — TORSEMIDE 20 MG PO TABS
20.0000 mg | ORAL_TABLET | Freq: Every day | ORAL | Status: DC
  Administered 2015-11-26 – 2015-11-27 (×2): 20 mg via ORAL
  Filled 2015-11-26 (×3): qty 1

## 2015-11-26 MED ORDER — ACETAMINOPHEN 325 MG PO TABS
650.0000 mg | ORAL_TABLET | Freq: Four times a day (QID) | ORAL | Status: DC | PRN
Start: 2015-11-26 — End: 2015-11-27

## 2015-11-26 MED ORDER — POLYETHYLENE GLYCOL 3350 17 G PO PACK
17.0000 g | PACK | Freq: Every day | ORAL | Status: DC
  Administered 2015-11-26: 17 g via ORAL
  Filled 2015-11-26: qty 1

## 2015-11-26 MED ORDER — INSULIN ASPART 100 UNIT/ML ~~LOC~~ SOLN
0.0000 [IU] | Freq: Three times a day (TID) | SUBCUTANEOUS | Status: DC
  Administered 2015-11-26: 3 [IU] via SUBCUTANEOUS
  Administered 2015-11-26: 5 [IU] via SUBCUTANEOUS
  Administered 2015-11-26: 2 [IU] via SUBCUTANEOUS
  Administered 2015-11-27: 8 [IU] via SUBCUTANEOUS
  Administered 2015-11-27: 5 [IU] via SUBCUTANEOUS

## 2015-11-26 MED ORDER — INSULIN ASPART 100 UNIT/ML ~~LOC~~ SOLN: 0.0000 [IU] | Freq: Every day | SUBCUTANEOUS | Status: DC

## 2015-11-26 MED ORDER — OXYCODONE HCL 5 MG PO TABS
10.0000 mg | ORAL_TABLET | ORAL | Status: DC | PRN
  Administered 2015-11-27: 10 mg via ORAL
  Filled 2015-11-26: qty 2

## 2015-11-26 MED ORDER — IBUPROFEN 200 MG PO TABS: 400.0000 mg | ORAL_TABLET | Freq: Four times a day (QID) | ORAL | Status: DC | PRN

## 2015-11-26 MED ORDER — SODIUM CHLORIDE 0.9 % IV SOLN
10.0000 mL/h | Freq: Once | INTRAVENOUS | Status: DC
Start: 2015-11-26 — End: 2015-11-27

## 2015-11-26 MED ORDER — SIMVASTATIN 10 MG PO TABS
20.0000 mg | ORAL_TABLET | Freq: Every day | ORAL | Status: DC
  Administered 2015-11-26 – 2015-11-27 (×2): 20 mg via ORAL
  Filled 2015-11-26 (×2): qty 2

## 2015-11-26 MED ORDER — RISAQUAD PO CAPS
2.0000 | ORAL_CAPSULE | Freq: Every day | ORAL | Status: DC
  Administered 2015-11-26 – 2015-11-27 (×2): 2 via ORAL
  Filled 2015-11-26 (×2): qty 2

## 2015-11-26 MED ORDER — GABAPENTIN 300 MG PO CAPS
600.0000 mg | ORAL_CAPSULE | Freq: Three times a day (TID) | ORAL | Status: DC
  Administered 2015-11-26 – 2015-11-27 (×5): 600 mg via ORAL
  Filled 2015-11-26 (×5): qty 2

## 2015-11-26 MED ORDER — FAMOTIDINE 20 MG PO TABS
20.0000 mg | ORAL_TABLET | Freq: Every day | ORAL | Status: DC
  Administered 2015-11-26 – 2015-11-27 (×2): 20 mg via ORAL
  Filled 2015-11-26 (×2): qty 1

## 2015-11-26 MED ORDER — SENNOSIDES-DOCUSATE SODIUM 8.6-50 MG PO TABS
2.0000 | ORAL_TABLET | Freq: Two times a day (BID) | ORAL | Status: DC
  Administered 2015-11-26 (×2): 2 via ORAL
  Filled 2015-11-26 (×2): qty 2

## 2015-11-26 MED ORDER — ENOXAPARIN SODIUM 40 MG/0.4ML ~~LOC~~ SOLN
40.0000 mg | SUBCUTANEOUS | Status: DC
  Administered 2015-11-26 – 2015-11-27 (×2): 40 mg via SUBCUTANEOUS
  Filled 2015-11-26 (×2): qty 0.4

## 2015-11-26 MED ORDER — SODIUM CHLORIDE 0.9 % IV BOLUS (SEPSIS)
1000.0000 mL | Freq: Once | INTRAVENOUS | Status: AC
  Administered 2015-11-26: 1000 mL via INTRAVENOUS

## 2015-11-26 NOTE — Progress Notes (Signed)
Dr. Karleen Hampshire on unit and made aware of positive MRSA PCR

## 2015-11-26 NOTE — Progress Notes (Signed)
Cory Weber is a 65 y.o. male with a past medical history significant for IDDM, HTN, hep C s/p Harvoni, and chronic anemia who presents with cough and found to have symptomatic anemia. He underwent 2 units of prbc transfusion, and repeat H&H is pending.  His fecal occult blood is negative.  CT CHEST with contrast reports Clusters of nodules and "tree-in-bud" opacities in the right greater than left lung apices compatible with bronchiolitis. Respiratory panel sent and pending.  He was admitted earlier this am, please see Dr Sabino Niemann note in detail.   Hosie Poisson, MD [phone]

## 2015-11-26 NOTE — H&P (Signed)
History and Physical  Patient Name: Cory Weber     NOM:[id number]    DOB: 1950/08/07    DOA: 11/25/2015 PCP: Maggie Font, MD   Patient coming from: [facility]  Chief Complaint: Cough  HPI: Cory Weber is a 65 y.o. male with a past medical history significant for IDDM, HTN, hep C s/p [place], and chronic anemia who presents with cough and found to have symptomatic anemia.  The patient was sent from his facility today for cough. This cough is productive of brown or black sputum. Is not associated with fevers or myalgias.  In addition, the patient notes that he was been short of breath and tired and sleeping a lot for about 2 weeks. This is similar to when he has had anemia in the past. His providers at his facility have not been checking his hemoglobin. He has had no melena, no hematochezia, no epistaxis, no vomiting blood.  ED course: -Temp 99.13F, heart rate 99, respirations and pulse oximetry normal, blood pressure 163/89 -Na 129, K 4.1, Cr 1.06, WBC 8.9K, Hgb 6.3 (baseline 7-8) -Chest x-rays cleared -CT of the chest with contrast shows bronchiolitis-type pattern with no pneumonia  The patient was last transfused in September after his appointment with Dr. Simona Huh.         ROS: Review of Systems  Constitutional: Negative for chills, fever and malaise/fatigue.  Respiratory: Positive for cough and sputum production. Negative for shortness of breath and wheezing.   Gastrointestinal: Negative for blood in stool, melena and vomiting.  Neurological: Positive for weakness.  All other systems reviewed and are negative.         Past Medical History:  Diagnosis Date  . Anemia   . Asthma   . Cellulitis and abscess of foot 07/2015  . CVA (cerebral vascular accident) ([place]) 2015   denies residual on 08/15/2015  . Hepatitis C    states he was diagnosed in 2007 or 2007 while living in [place], [place]  . Hyperlipidemia   . Hypertension   . Peripheral neuropathy  ([place])   . Pneumonia X 1  . PVD (peripheral vascular disease) ([place])   . Type II diabetes mellitus ([place])     Past Surgical History:  Procedure Laterality Date  . AMPUTATION Left 08/16/2015   Procedure: LEFT BELOW THE KNEE AMPUTATION ;  Surgeon: Serafina Mitchell, MD;  Location: [facility];  Service: Vascular;  Laterality: Left;  . BACK SURGERY    . BIOPSY N/A 09/03/2012   Procedure: BIOPSY;  Surgeon: Daneil Dolin, MD;  Location: [facility];  Service: Endoscopy;  Laterality: N/A;  gastric and gastric mucosa  . BIOPSY N/A 12/03/2012   Procedure: BIOPSY;  Surgeon: Daneil Dolin, MD;  Location: [facility];  Service: Endoscopy;  Laterality: N/A;  . COLONOSCOPY WITH PROPOFOL N/A 09/03/2012   JGG:[id number] polyp-removed as outlined above. Prominent internal hemorrhoids. Tubular adenoma  . ESOPHAGOGASTRODUODENOSCOPY (EGD) WITH PROPOFOL N/A 09/03/2012   TML:YYTKPT hernia. Gastric diverticulum. Gastric ulcers with associated erosions. Duodenal erosions. Status post gastric biopsy. H.PYLORI gastritis   . ESOPHAGOGASTRODUODENOSCOPY (EGD) WITH PROPOFOL N/A 12/03/2012   Dr. Gala Romney: gastric diverticulum, gastric erosions and scar. Previously noted gastric ulcer completed healed. Biopsy without H.pylori.   Marland Kitchen LIVER BIOPSY  2005   Done in [place], [place]. Chronic hepatitis with mild periportal inflammation, lobular unicellular necrosis and portal fibrosis. Grade 2, stage 1-2.  Marland Kitchen MAXIMUM ACCESS (MAS)POSTERIOR LUMBAR INTERBODY FUSION (PLIF) 1 LEVEL N/A 11/25/2013   Procedure: FOR MAXIMUM  ACCESS (MAS) POSTERIOR LUMBAR INTERBODY FUSION (PLIF) 1 LEVEL;  Surgeon: Eustace Moore, MD;  Location: [facility];  Service: Neurosurgery;  Laterality: N/A;  FOR MAXIMUM ACCESS (MAS) POSTERIOR LUMBAR INTERBODY FUSION (PLIF) 1 LEVEL LUMBAR 3-4  . PERIPHERAL VASCULAR CATHETERIZATION Left 08/01/2015   Procedure: Lower Extremity Angiography;  Surgeon: Serafina Mitchell, MD;  Location: [facility];  Service: Cardiovascular;  Laterality: Left;  .  PERIPHERAL VASCULAR CATHETERIZATION N/A 08/01/2015   Procedure: Abdominal Aortogram;  Surgeon: Serafina Mitchell, MD;  Location: [facility];  Service: Cardiovascular;  Laterality: N/A;  . PERIPHERAL VASCULAR CATHETERIZATION N/A 08/08/2015   Procedure: Abdominal Aortogram w/Lower Extremity;  Surgeon: Serafina Mitchell, MD;  Location: [facility];  Service: Cardiovascular;  Laterality: N/A;  . PERIPHERAL VASCULAR CATHETERIZATION Left 08/08/2015   Procedure: Peripheral Vascular Balloon Angioplasty;  Surgeon: Serafina Mitchell, MD;  Location: [facility];  Service: Cardiovascular;  Laterality: Left;  left popiteal artery, left peronealtrunk, left post tibial  . POLYPECTOMY N/A 09/03/2012   Procedure: POLYPECTOMY;  Surgeon: Daneil Dolin, MD;  Location: [facility];  Service: Endoscopy;  Laterality: N/A;  cecal polyp  . TONSILLECTOMY      Social History: Patient lives at [place].  The patient is wheelchair bound but can walk with PT with a prosthesis. He is a former smoker.   No Known Allergies  Family history: family history includes Breast cancer in his mother and sister; Diabetes in his father and sister; Heart disease in his father; Heart failure in his mother; Hyperlipidemia in his father; Hypertension in his father and sister.  Prior to Admission medications   Medication Sig Start Date End Date Taking? Authorizing Provider  acidophilus (RISAQUAD) CAPS capsule Take 2 capsules by mouth daily. 08/21/15  Yes Clanford Marisa Hua, MD  aspirin EC 81 MG tablet Take 1 tablet (81 mg total) by mouth daily. 07/20/15  Yes Orvan Falconer, MD  carvedilol (COREG) 3.125 MG tablet Take 1 tablet (3.125 mg total) by mouth 2 (two) times daily with a meal. 08/10/15  Yes Maryann Mikhail, DO  famotidine (PEPCID) 20 MG tablet Take 1 tablet (20 mg total) by mouth daily. 08/10/15  Yes Maryann Mikhail, DO  folic acid (FOLVITE) 1 MG tablet Take 1 tablet (1 mg total) by mouth daily. 08/10/15  Yes Maryann  Mikhail, DO  gabapentin (NEURONTIN) 600 MG tablet Take 600 mg by mouth 3 (three) times daily.   Yes Historical Provider, MD  insulin aspart (NOVOLOG) 100 UNIT/ML injection Inject 3 Units into the skin 3 (three) times daily before meals.   Yes Historical Provider, MD  insulin glargine (LANTUS) 100 UNIT/ML injection Inject 24 Units into the skin at bedtime.    Yes Historical Provider, MD  metFORMIN (GLUCOPHAGE) 500 MG tablet Take 500 mg by mouth daily with breakfast.   Yes Historical Provider, MD  Multiple Vitamin (MULTIVITAMIN WITH MINERALS) TABS tablet Take 1 tablet by mouth daily. 08/10/15  Yes Maryann Mikhail, DO  Multiple Vitamins-Minerals (DECUBI-VITE PO) Take 1 capsule by mouth daily.   Yes Historical Provider, MD  oxycodone (OXY-IR) 5 MG capsule Take 10 mg by mouth every 4 (four) hours as needed for pain.   Yes Historical Provider, MD  polyethylene glycol (MIRALAX / GLYCOLAX) packet Take 17 g by mouth daily. 08/21/15  Yes Clanford Marisa Hua, MD  senna-docusate (SENOKOT-S) 8.6-50 MG tablet Take 2 tablets by mouth 2 (two) times daily. 08/21/15  Yes Clanford Marisa Hua, MD  simvastatin (ZOCOR) 20 MG tablet Take 20 mg by mouth daily at 6 PM.   Yes Historical Provider, MD  tamsulosin (FLOMAX) 0.4 MG CAPS capsule Take 1 capsule (0.4 mg total) by mouth daily. 08/10/15  Yes Maryann Mikhail, DO  thiamine 100 MG tablet Take 1 tablet (100 mg total) by mouth daily. 08/10/15  Yes Maryann Mikhail, DO  torsemide (DEMADEX) 20 MG tablet Take 20 mg by mouth daily.    Yes Historical Provider, MD  oxyCODONE-acetaminophen (PERCOCET/ROXICET) 5-325 MG tablet Take 1-2 tablets by mouth every 4 (four) hours as needed for severe pain. Patient not taking: Reported on 11/25/2015 10/11/15   Gildardo Cranker, DO       Physical Exam: BP 159/89   Pulse 93   Temp 98.6 F (37 C)   Resp 20   SpO2 100%  General appearance: Well-developed, adult male, alert and in no acute distress, sleeping, rousable.   Eyes: Anicteric,  conjunctiva pink, lids and lashes normal. PERRL.    ENT: No nasal deformity, discharge, epistaxis.  Hearing normal. OP moist without lesions.   Neck: No neck masses.  Trachea midline.  No thyromegaly/tenderness. Lymph: No cervical or supraclavicular lymphadenopathy. Skin: Warm and dry.  No jaundice.  No suspicious rashes or lesions. Cardiac: RRR, nl S1-S2, no murmurs appreciated.  Capillary refill is brisk.   Respiratory: Normal respiratory rate and rhythm.  CTAB without rales or wheezes. Abdomen: Abdomen soft.  No TTP. No ascites, distension, hepatosplenomegaly.   MSK: No deformities or effusions.  No cyanosis or clubbing.  Left BKA. Neuro: Cranial nerves normal.  Sensation intact to light touch. Speech is fluent.  Muscle strength normal.    Psych: Sensorium intact and responding to questions, attention normal.  Behavior appropriate.  Affect normal.  Judgment and insight appear normal.     Labs on Admission:  I have personally reviewed following labs and imaging studies: CBC:  Recent Labs Lab 11/25/15 2313  WBC 8.9  NEUTROABS 5.8  HGB 6.3*  HCT 20.6*  MCV 76.3*  PLT 557*   Basic Metabolic Panel:  Recent Labs Lab 11/25/15 2313  NA 129*  K 4.1  CL 94*  CO2 27  GLUCOSE 397*  BUN 14  CREATININE 1.06  CALCIUM 8.7*   GFR: Estimated Creatinine Clearance: 73.7 mL/min (by C-G formula based on SCr of 1.06 mg/dL).  Liver Function Tests: No results for input(s): AST, ALT, ALKPHOS, BILITOT, PROT, ALBUMIN in the last 168 hours. No results for input(s): LIPASE, AMYLASE in the last 168 hours. No results for input(s): AMMONIA in the last 168 hours. Coagulation Profile: No results for input(s): INR, PROTIME in the last 168 hours. Cardiac Enzymes: No results for input(s): CKTOTAL, CKMB, CKMBINDEX, TROPONINI in the last 168 hours. BNP (last 3 results) No results for input(s): PROBNP in the last 8760 hours. HbA1C: No results for input(s): HGBA1C in the last 72  hours. CBG:  Recent Labs Lab 11/26/15 0157 11/26/15 0313  GLUCAP 284* 212*   Lipid Profile: No results for input(s): CHOL, HDL, LDLCALC, TRIG, CHOLHDL, LDLDIRECT in the last 72 hours. Thyroid Function Tests: No results for input(s): TSH, T4TOTAL, FREET4, T3FREE, THYROIDAB in the last 72 hours. Anemia Panel: No results for input(s): VITAMINB12, FOLATE, FERRITIN, TIBC, IRON, RETICCTPCT in the last 72 hours. Sepsis Labs: Invalid input(s): PROCALCITONIN, LACTICIDVEN No results found for this or any previous visit (from the past 240 hour(s)).       Radiological Exams on Admission: Personally reviewed: Dg Chest 2 View  Result Date:  11/25/2015 CLINICAL DATA:  Acute onset of cough. Diaphoresis. Initial encounter. EXAM: CHEST  2 VIEW COMPARISON:  Chest radiograph performed 09/05/2015 FINDINGS: The lungs are well-aerated and clear. There is no evidence of focal opacification, pleural effusion or pneumothorax. The heart is normal in size; the mediastinal contour is within normal limits. No acute osseous abnormalities are seen. Mild degenerative change is noted at the right acromioclavicular joint. IMPRESSION: No acute cardiopulmonary process seen. Electronically Signed   By: Garald Balding M.D.   On: 11/25/2015 23:50   Ct Chest W Contrast  Result Date: 11/26/2015 CLINICAL DATA:  65 y/o  M; coughing on black sputum. EXAM: CT CHEST WITH CONTRAST TECHNIQUE: Multidetector CT imaging of the chest was performed during intravenous contrast administration. CONTRAST:  33mL ISOVUE-300 IOPAMIDOL (ISOVUE-300) INJECTION 61% COMPARISON:  01/25/2016 chest radiograph. FINDINGS: Cardiovascular: Aortic valvular calcification. Moderate coronary artery calcification. Normal heart size. Normal caliber thoracic aorta and main pulmonary arteries. Mild aortic atherosclerosis with arch calcification. Bovine arch anatomy, normal variant. Mediastinum/Nodes: No enlarged mediastinal, hilar, or axillary lymph nodes. Thyroid  gland, trachea, and esophagus demonstrate no significant findings. Lungs/Pleura: Bilateral lung apex small clusters of nodules and "Tree in bud" opacities. No consolidation. No pleural effusion. No pneumothorax. Upper Abdomen: 20 mm left adrenal nodule measuring 10 HU consistent with adenoma (series 6, image 55). Musculoskeletal: Chronic right lateral and left anterior rib fracture deformities. No acute fracture or osseous abnormality is identified. Mild thoracic spondylosis. IMPRESSION: 1. Clusters of nodules and "tree-in-bud" opacities in the right greater than left lung apices compatible with bronchiolitis. 2. Aortic valvular calcification associated with aortic stenosis. 3. Aortic atherosclerosis. 4. Moderate coronary artery calcification. Electronically Signed   By: Kristine Garbe M.D.   On: 11/26/2015 02:20        Assessment/Plan  1. Symptomatic anemia:  This is chronic.  At his GI appointment in Sept, it was noted then and a transfusion at his facility was ordered.  I do not actually see that it happened (although it may have), and in the note following that visit I don't see that the NP addressed his anemia.    THe anemia has been presumed by GI to be an anemia of chronic disease and perhaps renal insufficiency, not GI blood loss.  He is due for endoscopy in Dec to screen for varices only.  -Transfuse 2 units now -Post-transfusion H/H -Discharge after  -Check folate, B12 -Check iron stores -Check reticulocytes -Check haptoglobin/LDH     2. Hyponatremia:  Acute on chronic.  Appears euvolemic. -Check urine sodium and serum/urine osmolality  3. HTN:  -Continue aspirin and statin -Continue carvedilol -Continue torsemide  4. IDDM:  -Continue glargine -Hold metformin until 10/18  5. Other medications:  -Continue gabapenting -Continue tamsulosin -Continue Senokot and Miralax PRN -Continue oxycodone       DVT prophylaxis: Lovenox  Code Status: FULL  Family  Communication: None present  Disposition Plan: Anticipate transfusion and discharge back to facility. Consults called: None Admission status: OBS At the point of initial evaluation, it is my clinical opinion that admission for OBSERVATION is reasonable and necessary because the patient's presenting complaints in the context of their chronic conditions represent sufficient risk of deterioration or significant morbidity to constitute reasonable grounds for close observation in the hospital setting, but that the patient may be medically stable for discharge from the hospital within 24 to 48 hours.    Medical decision making: Patient seen at 4:52 AM on 11/26/2015.  The patient was discussed with Kristeen Miss  Tapia, PA-C.  What exists of the patient's chart was reviewed in depth and summarized above.  Clinical condition: stable.        Edwin Dada [facility] Pager [phone]

## 2015-11-27 DIAGNOSIS — D649 Anemia, unspecified: Secondary | ICD-10-CM | POA: Diagnosis not present

## 2015-11-27 LAB — GLUCOSE, CAPILLARY
Glucose-Capillary: 252 mg/dL — ABNORMAL HIGH (ref 65–99)
Glucose-Capillary: 204 mg/dL — ABNORMAL HIGH (ref 65–99)

## 2015-11-27 LAB — TYPE AND SCREEN
ABO/RH(D): O POS
Antibody Screen: NEGATIVE
UNIT DIVISION: 0
Unit division: 0

## 2015-11-27 LAB — FOLATE RBC
Folate, Hemolysate: 496.5 ng/mL
Folate, RBC: 1888 ng/mL (ref >498)
Hematocrit: 26.3 % — ABNORMAL LOW (ref 37.5–51.0)

## 2015-11-27 LAB — HEMOGLOBIN AND HEMATOCRIT, BLOOD
HEMATOCRIT: 27 % — AB (ref 39.0–52.0)
Hemoglobin: 8.5 g/dL — ABNORMAL LOW (ref 13.0–17.0)

## 2015-11-27 LAB — HEMOGLOBIN A1C
HEMOGLOBIN A1C: 7.6 % — AB (ref 4.8–5.6)
Mean Plasma Glucose: 171 mg/dL

## 2015-11-27 LAB — HAPTOGLOBIN: Haptoglobin: 289 mg/dL — ABNORMAL HIGH (ref 34–200)

## 2015-11-27 MED ORDER — FERROUS SULFATE 325 (65 FE) MG PO TABS: 325.0000 mg | ORAL_TABLET | Freq: Three times a day (TID) | ORAL | 3 refills | Status: DC

## 2015-11-27 MED ORDER — CYANOCOBALAMIN 500 MCG PO TABS: 500.0000 ug | ORAL_TABLET | Freq: Every day | ORAL | 0 refills | Status: DC

## 2015-11-27 MED ORDER — CHLORHEXIDINE GLUCONATE CLOTH 2 % EX PADS: 6.0000 | MEDICATED_PAD | Freq: Every day | CUTANEOUS | 0 refills | Status: DC

## 2015-11-27 MED ORDER — VITAMIN B-12 1000 MCG PO TABS
500.0000 ug | ORAL_TABLET | Freq: Every day | ORAL | Status: DC
  Administered 2015-11-27: 500 ug via ORAL
  Filled 2015-11-27: qty 1

## 2015-11-27 MED ORDER — SENNOSIDES-DOCUSATE SODIUM 8.6-50 MG PO TABS: 2.0000 | ORAL_TABLET | Freq: Every day | ORAL | 0 refills | Status: DC | PRN

## 2015-11-27 MED ORDER — OXYCODONE HCL 5 MG PO CAPS: 10.0000 mg | ORAL_CAPSULE | ORAL | 0 refills | Status: DC | PRN

## 2015-11-27 MED ORDER — CHLORHEXIDINE GLUCONATE CLOTH 2 % EX PADS
6.0000 | MEDICATED_PAD | Freq: Every day | CUTANEOUS | Status: DC
  Administered 2015-11-27: 6 via TOPICAL

## 2015-11-27 MED ORDER — MUPIROCIN 2 % EX OINT
1.0000 "application " | TOPICAL_OINTMENT | Freq: Two times a day (BID) | CUTANEOUS | Status: DC
  Administered 2015-11-27: 1 via NASAL
  Filled 2015-11-27: qty 22

## 2015-11-27 MED ORDER — FERROUS SULFATE 325 (65 FE) MG PO TABS
325.0000 mg | ORAL_TABLET | Freq: Three times a day (TID) | ORAL | Status: DC
  Administered 2015-11-27 (×2): 325 mg via ORAL
  Filled 2015-11-27 (×2): qty 1

## 2015-11-27 MED ORDER — MUPIROCIN 2 % EX OINT: 1.0000 "application " | TOPICAL_OINTMENT | Freq: Two times a day (BID) | CUTANEOUS | 0 refills | Status: DC

## 2015-11-27 NOTE — Progress Notes (Signed)
Report called to [facility]. Patient to be transported via [facility] , awaiting arrival.

## 2015-11-27 NOTE — NC FL2 (Signed)
[place] LEVEL OF CARE SCREENING TOOL     IDENTIFICATION  Patient Name: Cory Weber Birthdate: Sep 27, 1950 Sex: male Admission Date (Current Location): 11/25/2015  [place] and [place] Number:  Kathleen Argue [id number] [place] and Address:  [facility],  [place] [address], [place]      Provider Number: [id number]  Attending Physician Name and Address:  Hosie Poisson, MD  Relative Name and Phone Number:       Current Level of Care: Other (Comment) Recommended Level of Care: [place] Prior Approval Number:    Date Approved/Denied:   PASRR Number:    Discharge Plan: SNF    Current Diagnoses: Patient Active Problem List   Diagnosis Date Noted  . Symptomatic anemia 11/26/2015  . Cough 11/26/2015  . GERD (gastroesophageal reflux disease) 10/08/2015  . Amputation of left lower extremity below knee ([place]) 08/21/2015  . Phantom limb pain ([place])   . Anemia of chronic disease   . HLD (hyperlipidemia)   . BPH (benign prostatic hyperplasia)   . ETOH abuse   . PVD (peripheral vascular disease) ([place])   . Chronic hepatitis C without hepatic coma ([place])   . History of CVA (cerebrovascular accident) without residual deficits   . Hyponatremia   . Atherosclerosis of left lower extremity with ulceration of midfoot ([place])   . Diabetes mellitus with foot ulcer, without long-term current use of insulin ([place]) 07/18/2015  . Chronic kidney disease, stage 3 07/18/2015  . Essential hypertension 07/18/2015  . Constipation 12/01/2013  . Radiculopathy of lumbar region 11/29/2013  . Acute right lumbar radiculopathy 11/26/2013  . S/P lumbar spinal fusion 11/25/2013  . Lumbar spinal stenosis 11/24/2013  . Right leg weakness 11/23/2013  . PUD (peptic ulcer disease) 11/19/2012  . Unspecified constipation 05/13/2012  . Melena 12/03/2011  . Hemiparesthesia 02/03/2011  . Polysubstance abuse 02/03/2011  . Tobacco abuse 02/03/2011    Orientation  RESPIRATION BLADDER Height & Weight     Self, Time, Situation, Place  Normal Continent Weight: 198 lb (89.8 kg) (per patient.. no bed scale, pt. Rt leg ampute ) Height:  6' (182.9 cm)  BEHAVIORAL SYMPTOMS/MOOD NEUROLOGICAL BOWEL NUTRITION STATUS      Continent Diet  AMBULATORY STATUS COMMUNICATION OF NEEDS Skin   Limited Assist Verbally Surgical wounds                       Personal Care Assistance Level of Assistance  Bathing, Dressing, Feeding Bathing Assistance: Limited assistance Feeding assistance: Independent Dressing Assistance: Limited assistance     Functional Limitations Info  Sight, Hearing, Speech Sight Info: Adequate Hearing Info: Adequate Speech Info: Adequate    SPECIAL CARE FACTORS FREQUENCY                       Contractures Contractures Info: Not present    Additional Factors Info  Insulin Sliding Scale               Current Medications (11/27/2015):  This is the current hospital active medication list Current Facility-Administered Medications  Medication Dose Route Frequency Provider Last Rate Last Dose  . 0.9 %  sodium chloride infusion  10 mL/hr Intravenous Once Delsa Grana, PA-C      . acetaminophen (TYLENOL) tablet 650 mg  650 mg Oral Q6H PRN Edwin Dada, MD       Or  . acetaminophen (TYLENOL) suppository 650 mg  650 mg Rectal Q6H PRN Edwin Dada,  MD      . acidophilus (RISAQUAD) capsule 2 capsule  2 capsule Oral Daily Edwin Dada, MD   2 capsule at 11/27/15 1008  . aspirin EC tablet 81 mg  81 mg Oral Daily Edwin Dada, MD   81 mg at 11/27/15 1006  . carvedilol (COREG) tablet 3.125 mg  3.125 mg Oral BID WC Edwin Dada, MD   3.125 mg at 11/27/15 1006  . Chlorhexidine Gluconate Cloth 2 % PADS 6 each  6 each Topical Q0600 Hosie Poisson, MD   6 each at 11/27/15 1008  . enoxaparin (LOVENOX) injection 40 mg  40 mg Subcutaneous Q24H Edwin Dada, MD   40 mg at 11/27/15 1008  .  famotidine (PEPCID) tablet 20 mg  20 mg Oral Daily Edwin Dada, MD   20 mg at 11/27/15 1006  . ferrous sulfate tablet 325 mg  325 mg Oral TID WC Hosie Poisson, MD   325 mg at 11/27/15 1229  . gabapentin (NEURONTIN) capsule 600 mg  600 mg Oral TID Edwin Dada, MD   600 mg at 11/27/15 1006  . ibuprofen (ADVIL,MOTRIN) tablet 400 mg  400 mg Oral Q6H PRN Edwin Dada, MD      . insulin aspart (novoLOG) injection 0-15 Units  0-15 Units Subcutaneous TID WC Edwin Dada, MD   5 Units at 11/27/15 1229  . insulin aspart (novoLOG) injection 0-5 Units  0-5 Units Subcutaneous QHS Edwin Dada, MD      . insulin glargine (LANTUS) injection 24 Units  24 Units Subcutaneous QHS Edwin Dada, MD   24 Units at 11/26/15 2140  . mupirocin ointment (BACTROBAN) 2 % 1 application  1 application Nasal BID Hosie Poisson, MD   1 application at 50/53/97 1008  . oxyCODONE (Oxy IR/ROXICODONE) immediate release tablet 10 mg  10 mg Oral Q4H PRN Edwin Dada, MD      . polyethylene glycol (MIRALAX / GLYCOLAX) packet 17 g  17 g Oral Daily Edwin Dada, MD   17 g at 11/26/15 1028  . senna-docusate (Senokot-S) tablet 2 tablet  2 tablet Oral BID Edwin Dada, MD   2 tablet at 11/26/15 2139  . simvastatin (ZOCOR) tablet 20 mg  20 mg Oral q1800 Edwin Dada, MD   20 mg at 11/26/15 1700  . tamsulosin (FLOMAX) capsule 0.4 mg  0.4 mg Oral Daily Edwin Dada, MD   0.4 mg at 11/27/15 1006  . torsemide (DEMADEX) tablet 20 mg  20 mg Oral Daily Edwin Dada, MD   20 mg at 11/27/15 1008  . vitamin B-12 (CYANOCOBALAMIN) tablet 500 mcg  500 mcg Oral Daily Hosie Poisson, MD   500 mcg at 11/27/15 1229     Discharge Medications: Please see discharge summary for a list of discharge medications.  Relevant Imaging Results:  Relevant Lab Results:   Additional Information SS#: [id number]  Lia Hopping, LCSW

## 2015-11-27 NOTE — Clinical Social Work Placement (Addendum)
   CLINICAL SOCIAL WORK PLACEMENT  NOTE  Date:  11/27/2015  Patient Details  Name: Cory Weber MRN: [id number] Date of Birth: Oct 06, 1950  Clinical Social Work is seeking post-discharge placement for this patient at the [place] level of care (*CSW will initial, date and re-position this form in  chart as items are completed):  No   Patient/family provided with [facility] Work Department's list of facilities offering this level of care within the geographic area requested by the patient (or if unable, by the patient's family).  No   Patient/family informed of their freedom to choose among providers that offer the needed level of care, that participate in Medicare, Medicaid or managed care program needed by the patient, have an available bed and are willing to accept the patient.  No   Patient/family informed of [facility]'s ownership interest in [facility] and [facility], as well as of the fact that they are under no obligation to receive care at these facilities.  PASRR submitted to EDS on       PASRR number received on       Existing PASRR number confirmed on 11/27/15     FL2 transmitted to all facilities in geographic area requested by pt/family on       FL2 transmitted to all facilities within larger geographic area on       Patient informed that his/her managed care company has contracts with or will negotiate with certain facilities, including the following:  [facility]     Yes   Patient/family informed of bed offers received.  Patient chooses bed at [facility]     Physician recommends and patient chooses bed at      Patient to be transferred to   on 11/27/15.  Patient to be transferred to facility by ptar     Patient family notified on 11/27/15 of transfer.  Name of family member notified:  Sharon Mt     PHYSICIAN Please sign FL2, Please prepare priority discharge summary,  including medications     Additional Comment:  Patient will be returning to facility  _______________________________________________ Lia Hopping, LCSW 11/27/2015, 2:12 PM

## 2015-11-27 NOTE — Discharge Summary (Signed)
Physician Discharge Summary  Cory Weber WVP:[id number] DOB: 10-05-1950 DOA: 11/25/2015  PCP: Maggie Font, MD  Admit date: 11/25/2015 Discharge date: 11/27/2015  Admitted From: SNF Disposition:  snf  Recommendations for Outpatient Follow-up:  1. Follow up with PCP in 1-2 weeks 2. Please obtain BMP/CBC in one week 3. Please follow up on the following pending results:    Discharge Condition: stable.  CODE STATUS:full code.  Diet recommendation: Heart Healthy  Brief/Interim Summary: Cory Sobek Scottis a 65 y.o.malewith a past medical history significant for IDDM, HTN, hep C s/p Harvoni, and chronic anemiawho presents with cough and found to have symptomatic anemia. He underwent 2 units of prbc transfusion, and repeat H&H is STABLE around 8..  His fecal occult blood is negative. He follows with Dr Loletha Carrow and he reports he is to be scheduled for colonoscopy and EGD in the future. His iron stores were low, sto I have started him on iron supplements with senna and colace as needed. Meanwhile his CT chest showed Clusters of nodules and "tree-in-bud" opacities in the right greater than left lung apices compatible with bronchiolitis.his respiratory panel showed rhinovirus. He is on RA and comfortable.   Hypertension: well controlled.  Diabetes Mellitus: CBG (last 3)   Recent Labs  11/26/15 2136 11/27/15 0746 11/27/15 1202  GLUCAP 186* 252* 204*    Resume SSI. hgba1c is 7.6   Hyponatremia: resolved.    Discharge Diagnoses:  Principal Problem:   Symptomatic anemia Active Problems:   Diabetes mellitus with foot ulcer, without long-term current use of insulin (HCC)   Chronic kidney disease, stage 3   Essential hypertension   Chronic hepatitis C without hepatic coma (HCC)   History of CVA (cerebrovascular accident) without residual deficits   Hyponatremia   Anemia of chronic disease   PVD (peripheral vascular disease) (HCC)   Amputation of left lower extremity below  knee (HCC)   Cough    Discharge Instructions  Discharge Instructions    Diet - low sodium heart healthy    Complete by:  As directed    Discharge instructions    Complete by:  As directed    Please follow up with Dr Loletha Carrow in 1 to 2 weeks, and you  Will need to schedule the colonoscopy and upper endoscopy as recommended.       Medication List    TAKE these medications   acidophilus Caps capsule Take 2 capsules by mouth daily.   aspirin EC 81 MG tablet Take 1 tablet (81 mg total) by mouth daily.   carvedilol 3.125 MG tablet Commonly known as:  COREG Take 1 tablet (3.125 mg total) by mouth 2 (two) times daily with a meal.   Chlorhexidine Gluconate Cloth 2 % Pads Apply 6 each topically daily at 6 (six) AM. Start taking on:  11/28/2015   cyanocobalamin 500 MCG tablet Take 1 tablet (500 mcg total) by mouth daily. Start taking on:  11/28/2015   DECUBI-VITE PO Take 1 capsule by mouth daily.   famotidine 20 MG tablet Commonly known as:  PEPCID Take 1 tablet (20 mg total) by mouth daily.   ferrous sulfate 325 (65 FE) MG tablet Take 1 tablet (325 mg total) by mouth 3 (three) times daily with meals.   folic acid 1 MG tablet Commonly known as:  FOLVITE Take 1 tablet (1 mg total) by mouth daily.   gabapentin 600 MG tablet Commonly known as:  NEURONTIN Take 600 mg by mouth 3 (three) times daily.  insulin aspart 100 UNIT/ML injection Commonly known as:  novoLOG Inject 3 Units into the skin 3 (three) times daily before meals.   insulin glargine 100 UNIT/ML injection Commonly known as:  LANTUS Inject 24 Units into the skin at bedtime.   metFORMIN 500 MG tablet Commonly known as:  GLUCOPHAGE Take 500 mg by mouth daily with breakfast.   multivitamin with minerals Tabs tablet Take 1 tablet by mouth daily.   mupirocin ointment 2 % Commonly known as:  BACTROBAN Place 1 application into the nose 2 (two) times daily.   oxycodone 5 MG capsule Commonly known as:   OXY-IR Take 10 mg by mouth every 4 (four) hours as needed for pain.   polyethylene glycol packet Commonly known as:  MIRALAX / GLYCOLAX Take 17 g by mouth daily.   senna-docusate 8.6-50 MG tablet Commonly known as:  Senokot-S Take 2 tablets by mouth 2 (two) times daily. What changed:  Another medication with the same name was added. Make sure you understand how and when to take each.   senna-docusate 8.6-50 MG tablet Commonly known as:  Senokot-S Take 2 tablets by mouth daily as needed for mild constipation. What changed:  You were already taking a medication with the same name, and this prescription was added. Make sure you understand how and when to take each.   simvastatin 20 MG tablet Commonly known as:  ZOCOR Take 20 mg by mouth daily at 6 PM.   tamsulosin 0.4 MG Caps capsule Commonly known as:  FLOMAX Take 1 capsule (0.4 mg total) by mouth daily.   thiamine 100 MG tablet Take 1 tablet (100 mg total) by mouth daily.   torsemide 20 MG tablet Commonly known as:  DEMADEX Take 20 mg by mouth daily.      Follow-up [place] Chaves, MD. Schedule an appointment as soon as possible for a visit in 1 week(s).   Specialty:  Gastroenterology Contact information: [place] [address] [phone]          No Known Allergies  Consultations:  none   Procedures/Studies: Dg Chest 2 View  Result Date: 11/25/2015 CLINICAL DATA:  Acute onset of cough. Diaphoresis. Initial encounter. EXAM: CHEST  2 VIEW COMPARISON:  Chest radiograph performed 09/05/2015 FINDINGS: The lungs are well-aerated and clear. There is no evidence of focal opacification, pleural effusion or pneumothorax. The heart is normal in size; the mediastinal contour is within normal limits. No acute osseous abnormalities are seen. Mild degenerative change is noted at the right acromioclavicular joint. IMPRESSION: No acute cardiopulmonary process seen. Electronically Signed    By: Garald Balding M.D.   On: 11/25/2015 23:50   Ct Chest W Contrast  Result Date: 11/26/2015 CLINICAL DATA:  65 y/o  M; coughing on black sputum. EXAM: CT CHEST WITH CONTRAST TECHNIQUE: Multidetector CT imaging of the chest was performed during intravenous contrast administration. CONTRAST:  66mL ISOVUE-300 IOPAMIDOL (ISOVUE-300) INJECTION 61% COMPARISON:  01/25/2016 chest radiograph. FINDINGS: Cardiovascular: Aortic valvular calcification. Moderate coronary artery calcification. Normal heart size. Normal caliber thoracic aorta and main pulmonary arteries. Mild aortic atherosclerosis with arch calcification. Bovine arch anatomy, normal variant. Mediastinum/Nodes: No enlarged mediastinal, hilar, or axillary lymph nodes. Thyroid gland, trachea, and esophagus demonstrate no significant findings. Lungs/Pleura: Bilateral lung apex small clusters of nodules and "Tree in bud" opacities. No consolidation. No pleural effusion. No pneumothorax. Upper Abdomen: 20 mm left adrenal nodule measuring 10 HU consistent with adenoma (series 6, image  63). Musculoskeletal: Chronic right lateral and left anterior rib fracture deformities. No acute fracture or osseous abnormality is identified. Mild thoracic spondylosis. IMPRESSION: 1. Clusters of nodules and "tree-in-bud" opacities in the right greater than left lung apices compatible with bronchiolitis. 2. Aortic valvular calcification associated with aortic stenosis. 3. Aortic atherosclerosis. 4. Moderate coronary artery calcification. Electronically Signed   By: Kristine Garbe M.D.   On: 11/26/2015 02:20   US Abdomen Limited Ruq  Result Date: 11/03/2015 CLINICAL DATA:  Hepatitis-C and hepatic cirrhosis EXAM: US ABDOMEN LIMITED - RIGHT UPPER QUADRANT COMPARISON:  None. FINDINGS: Gallbladder: No gallstones or wall thickening visualized. No sonographic Murphy sign noted by sonographer. Common bile duct: Diameter: 3.2 mm. Liver: No focal lesion identified. Within  normal limits in parenchymal echogenicity. IMPRESSION: No acute abnormality noted. Electronically Signed   By: Inez Catalina M.D.   On: 11/03/2015 11:33       Subjective: No new complaints.   Discharge Exam: Vitals:   11/26/15 2100 11/27/15 0608  BP: (!) 152/88 (!) 146/98  Pulse: 98 93  Resp: 14 15  Temp: 99.3 F (37.4 C) 98.9 F (37.2 C)   Vitals:   11/26/15 1034 11/26/15 1230 11/26/15 2100 11/27/15 0608  BP: (!) 166/91 (!) 154/95 (!) 152/88 (!) 146/98  Pulse:   98 93  Resp: 16 (!) 8 14 15   Temp: 99.7 F (37.6 C) 99.4 F (37.4 C) 99.3 F (37.4 C) 98.9 F (37.2 C)  TempSrc: Oral Oral Oral Oral  SpO2: 99% 99% 97% 98%  Weight:      Height:        General: Pt is alert, awake, not in acute distress Cardiovascular: RRR, S1/S2 +, no rubs, no gallops Respiratory: CTA bilaterally, no wheezing, no rhonchi Abdominal: Soft, NT, ND, bowel sounds + Extremities: no edema, no cyanosis    The results of significant diagnostics from this hospitalization (including imaging, microbiology, ancillary and laboratory) are listed below for reference.     Microbiology: Recent Results (from the past 240 hour(s))  Respiratory Panel by PCR     Status: Abnormal   Collection Time: 11/26/15 11:28 AM  Result Value Ref Range Status   Adenovirus NOT DETECTED NOT DETECTED Final   Coronavirus 229E NOT DETECTED NOT DETECTED Final   Coronavirus HKU1 NOT DETECTED NOT DETECTED Final   Coronavirus NL63 NOT DETECTED NOT DETECTED Final   Coronavirus OC43 NOT DETECTED NOT DETECTED Final   Metapneumovirus NOT DETECTED NOT DETECTED Final   Rhinovirus / Enterovirus DETECTED (A) NOT DETECTED Final   Influenza A NOT DETECTED NOT DETECTED Final   Influenza B NOT DETECTED NOT DETECTED Final   Parainfluenza Virus 1 NOT DETECTED NOT DETECTED Final   Parainfluenza Virus 2 NOT DETECTED NOT DETECTED Final   Parainfluenza Virus 3 NOT DETECTED NOT DETECTED Final   Parainfluenza Virus 4 NOT DETECTED NOT DETECTED  Final   Respiratory Syncytial Virus NOT DETECTED NOT DETECTED Final   Bordetella pertussis NOT DETECTED NOT DETECTED Final   Chlamydophila pneumoniae NOT DETECTED NOT DETECTED Final   Mycoplasma pneumoniae NOT DETECTED NOT DETECTED Final    Comment: Performed at [facility]  MRSA PCR Screening     Status: Abnormal   Collection Time: 11/26/15 12:29 PM  Result Value Ref Range Status   MRSA by PCR POSITIVE (A) NEGATIVE Final    Comment:        The GeneXpert MRSA Assay (FDA approved for NASAL specimens only), is one component of a comprehensive MRSA colonization surveillance  program. It is not intended to diagnose MRSA infection nor to guide or monitor treatment for MRSA infections. CRITICAL RESULT CALLED TO, READ BACK BY AND VERIFIED WITH: BETHAL,E RN [id number] [place],N      Labs: BNP (last 3 results) No results for input(s): BNP in the last 8760 hours. Basic Metabolic Panel:  Recent Labs Lab 11/25/15 2313 11/26/15 1440  NA 129* 136  K 4.1 4.0  CL 94* 99*  CO2 27 30  GLUCOSE 397* 175*  BUN 14 12  CREATININE 1.06 0.97  CALCIUM 8.7* 9.4   Liver Function Tests: No results for input(s): AST, ALT, ALKPHOS, BILITOT, PROT, ALBUMIN in the last 168 hours. No results for input(s): LIPASE, AMYLASE in the last 168 hours. No results for input(s): AMMONIA in the last 168 hours. CBC:  Recent Labs Lab 11/25/15 2313 11/26/15 1440 11/27/15 1133  WBC 8.9 9.6  --   NEUTROABS 5.8 6.4  --   HGB 6.3* 8.3*  8.4* 8.5*  HCT 20.6* 27.0*  26.8* 27.0*  MCV 76.3* 76.4*  --   PLT 462* 481*  --    Cardiac Enzymes: No results for input(s): CKTOTAL, CKMB, CKMBINDEX, TROPONINI in the last 168 hours. BNP: Invalid input(s): POCBNP CBG:  Recent Labs Lab 11/26/15 1220 11/26/15 1735 11/26/15 2136 11/27/15 0746 11/27/15 1202  GLUCAP 223* 149* 186* 252* 204*   D-Dimer No results for input(s): DDIMER in the last 72 hours. Hgb A1c  Recent Labs  11/26/15 1440  HGBA1C  7.6*   Lipid Profile No results for input(s): CHOL, HDL, LDLCALC, TRIG, CHOLHDL, LDLDIRECT in the last 72 hours. Thyroid function studies No results for input(s): TSH, T4TOTAL, T3FREE, THYROIDAB in the last 72 hours.  Invalid input(s): FREET3 Anemia work up  Recent Labs  11/26/15 1440  VITAMINB12 287  FERRITIN 8*  TIBC 451*  IRON 67  RETICCTPCT 1.5   Urinalysis    Component Value Date/Time   COLORURINE YELLOW 08/22/2015 1026   APPEARANCEUR CLEAR 08/22/2015 1026   LABSPEC 1.017 08/22/2015 1026   PHURINE 6.0 08/22/2015 1026   GLUCOSEU NEGATIVE 08/22/2015 1026   HGBUR NEGATIVE 08/22/2015 1026   BILIRUBINUR NEGATIVE 08/22/2015 1026   KETONESUR NEGATIVE 08/22/2015 1026   PROTEINUR NEGATIVE 08/22/2015 1026   UROBILINOGEN 1.0 11/29/2013 1705   NITRITE NEGATIVE 08/22/2015 1026   LEUKOCYTESUR NEGATIVE 08/22/2015 1026   Sepsis Labs Invalid input(s): PROCALCITONIN,  WBC,  LACTICIDVEN Microbiology Recent Results (from the past 240 hour(s))  Respiratory Panel by PCR     Status: Abnormal   Collection Time: 11/26/15 11:28 AM  Result Value Ref Range Status   Adenovirus NOT DETECTED NOT DETECTED Final   Coronavirus 229E NOT DETECTED NOT DETECTED Final   Coronavirus HKU1 NOT DETECTED NOT DETECTED Final   Coronavirus NL63 NOT DETECTED NOT DETECTED Final   Coronavirus OC43 NOT DETECTED NOT DETECTED Final   Metapneumovirus NOT DETECTED NOT DETECTED Final   Rhinovirus / Enterovirus DETECTED (A) NOT DETECTED Final   Influenza A NOT DETECTED NOT DETECTED Final   Influenza B NOT DETECTED NOT DETECTED Final   Parainfluenza Virus 1 NOT DETECTED NOT DETECTED Final   Parainfluenza Virus 2 NOT DETECTED NOT DETECTED Final   Parainfluenza Virus 3 NOT DETECTED NOT DETECTED Final   Parainfluenza Virus 4 NOT DETECTED NOT DETECTED Final   Respiratory Syncytial Virus NOT DETECTED NOT DETECTED Final   Bordetella pertussis NOT DETECTED NOT DETECTED Final   Chlamydophila pneumoniae NOT DETECTED NOT  DETECTED Final   Mycoplasma pneumoniae NOT DETECTED  NOT DETECTED Final    Comment: Performed at [facility]  MRSA PCR Screening     Status: Abnormal   Collection Time: 11/26/15 12:29 PM  Result Value Ref Range Status   MRSA by PCR POSITIVE (A) NEGATIVE Final    Comment:        The GeneXpert MRSA Assay (FDA approved for NASAL specimens only), is one component of a comprehensive MRSA colonization surveillance program. It is not intended to diagnose MRSA infection nor to guide or monitor treatment for MRSA infections. CRITICAL RESULT CALLED TO, READ BACK BY AND VERIFIED WITH: [place] [id number] [place]      Time coordinating discharge: Over 30 minutes  SIGNED:   Hosie Poisson, MD  [facility] 11/27/2015, 1:49 PM Pager   If 7PM-7AM, please contact night-coverage [email or web address] Password [email or web address]

## 2015-11-28 ENCOUNTER — Non-Acute Institutional Stay (SKILLED_NURSING_FACILITY): Payer: Medicaid Other | Admitting: Adult Health

## 2015-11-28 ENCOUNTER — Encounter: Payer: Self-pay | Admitting: Adult Health

## 2015-11-28 DIAGNOSIS — B182 Chronic viral hepatitis C: Secondary | ICD-10-CM | POA: Diagnosis not present

## 2015-11-28 DIAGNOSIS — K279 Peptic ulcer, site unspecified, unspecified as acute or chronic, without hemorrhage or perforation: Secondary | ICD-10-CM | POA: Diagnosis not present

## 2015-11-28 DIAGNOSIS — N400 Enlarged prostate without lower urinary tract symptoms: Secondary | ICD-10-CM

## 2015-11-28 DIAGNOSIS — E782 Mixed hyperlipidemia: Secondary | ICD-10-CM | POA: Diagnosis not present

## 2015-11-28 DIAGNOSIS — D500 Iron deficiency anemia secondary to blood loss (chronic): Secondary | ICD-10-CM | POA: Diagnosis not present

## 2015-11-28 DIAGNOSIS — N183 Chronic kidney disease, stage 3 unspecified: Secondary | ICD-10-CM

## 2015-11-28 DIAGNOSIS — E1151 Type 2 diabetes mellitus with diabetic peripheral angiopathy without gangrene: Secondary | ICD-10-CM | POA: Diagnosis not present

## 2015-11-28 DIAGNOSIS — I10 Essential (primary) hypertension: Secondary | ICD-10-CM | POA: Diagnosis not present

## 2015-11-28 NOTE — Progress Notes (Signed)
Patient ID: Cory Weber, male   DOB: 01/18/1951, 65 y.o.   MRN: [id number]   Location:   [facility] Room Number: 223-B Place of Service:  SNF (31)   CODE STATUS: Full Code  No Known Allergies  Chief Complaint  Patient presents with  . Hospitalization Follow-up    Hospital Follow up    HPI:  He is a long term resident of this facility being seen after being hospitalized for symptomatic  Anemia; and did undergo 2 units packed cells.  He is due to have an EGD on 01-23-16. He tells me that he is feeling much better. There are no nursing concerns at this time.    Past Medical History:  Diagnosis Date  . Anemia   . Asthma   . Cellulitis and abscess of foot 07/2015  . CVA (cerebral vascular accident) ([place]) 2015   denies residual on 08/15/2015  . Hepatitis C    states he was diagnosed in 2007 or 2007 while living in [place], [place]  . Hyperlipidemia   . Hypertension   . Peripheral neuropathy ([place])   . Pneumonia X 1  . PVD (peripheral vascular disease) ([place])   . Type II diabetes mellitus ([place])     Past Surgical History:  Procedure Laterality Date  . AMPUTATION Left 08/16/2015   Procedure: LEFT BELOW THE KNEE AMPUTATION ;  Surgeon: Serafina Mitchell, MD;  Location: [facility];  Service: Vascular;  Laterality: Left;  . BACK SURGERY    . BIOPSY N/A 09/03/2012   Procedure: BIOPSY;  Surgeon: Daneil Dolin, MD;  Location: [facility];  Service: Endoscopy;  Laterality: N/A;  gastric and gastric mucosa  . BIOPSY N/A 12/03/2012   Procedure: BIOPSY;  Surgeon: Daneil Dolin, MD;  Location: [facility];  Service: Endoscopy;  Laterality: N/A;  . COLONOSCOPY WITH PROPOFOL N/A 09/03/2012   WEX:HBZJIRC polyp-removed as outlined above. Prominent internal hemorrhoids. Tubular adenoma  . ESOPHAGOGASTRODUODENOSCOPY (EGD) WITH PROPOFOL N/A 09/03/2012   VEL:FYBOFB hernia. Gastric diverticulum. Gastric ulcers with associated erosions. Duodenal erosions. Status post gastric biopsy. H.PYLORI gastritis   .  ESOPHAGOGASTRODUODENOSCOPY (EGD) WITH PROPOFOL N/A 12/03/2012   Dr. Gala Romney: gastric diverticulum, gastric erosions and scar. Previously noted gastric ulcer completed healed. Biopsy without H.pylori.   Marland Kitchen LIVER BIOPSY  2005   Done in [place], [place]. Chronic hepatitis with mild periportal inflammation, lobular unicellular necrosis and portal fibrosis. Grade 2, stage 1-2.  Marland Kitchen MAXIMUM ACCESS (MAS)POSTERIOR LUMBAR INTERBODY FUSION (PLIF) 1 LEVEL N/A 11/25/2013   Procedure: FOR MAXIMUM ACCESS (MAS) POSTERIOR LUMBAR INTERBODY FUSION (PLIF) 1 LEVEL;  Surgeon: Eustace Moore, MD;  Location: [facility];  Service: Neurosurgery;  Laterality: N/A;  FOR MAXIMUM ACCESS (MAS) POSTERIOR LUMBAR INTERBODY FUSION (PLIF) 1 LEVEL LUMBAR 3-4  . PERIPHERAL VASCULAR CATHETERIZATION Left 08/01/2015   Procedure: Lower Extremity Angiography;  Surgeon: Serafina Mitchell, MD;  Location: [facility];  Service: Cardiovascular;  Laterality: Left;  . PERIPHERAL VASCULAR CATHETERIZATION N/A 08/01/2015   Procedure: Abdominal Aortogram;  Surgeon: Serafina Mitchell, MD;  Location: [facility];  Service: Cardiovascular;  Laterality: N/A;  . PERIPHERAL VASCULAR CATHETERIZATION N/A 08/08/2015   Procedure: Abdominal Aortogram w/Lower Extremity;  Surgeon: Serafina Mitchell, MD;  Location: [facility];  Service: Cardiovascular;  Laterality: N/A;  . PERIPHERAL VASCULAR CATHETERIZATION Left 08/08/2015   Procedure: Peripheral Vascular Balloon Angioplasty;  Surgeon: Serafina Mitchell, MD;  Location: [facility];  Service: Cardiovascular;  Laterality: Left;  left popiteal artery, left peronealtrunk, left post tibial  . POLYPECTOMY N/A 09/03/2012   Procedure: POLYPECTOMY;  Surgeon: Daneil Dolin, MD;  Location: [facility];  Service: Endoscopy;  Laterality: N/A;  cecal polyp  . TONSILLECTOMY      Social History   Social History  . Marital status: Divorced    Spouse name: N/A  . Number of children: 1  . Years of education: N/A    Occupational History  . trying to get disability    Social History Main Topics  . Smoking status: Former Smoker    Packs/day: 1.00    Years: 47.00    Types: Cigarettes    Quit date: 07/13/2015  . Smokeless tobacco: Never Used  . Alcohol use 0.0 oz/week     Comment: 08/15/2015 "stopped drinking 3-4 years ago"  . Drug use:     Types: Cocaine     Comment: 08/15/2015 "last used cocaine 3-4 months ago"  . Sexual activity: No   Other Topics Concern  . Not on file   Social History Narrative  . No narrative on file   Family History  Problem Relation Age of Onset  . Breast cancer Mother   . Heart failure Mother   . Diabetes Father   . Hypertension Father   . Heart disease Father   . Hyperlipidemia Father   . Diabetes Sister   . Hypertension Sister   . Breast cancer Sister   . Colon cancer Neg Hx   . Liver disease Neg Hx       VITAL SIGNS BP (!) 193/73   Pulse 70   Temp 97.3 F (36.3 C) (Oral)   Resp 18   Ht 5\' 10"  (1.778 m)   Wt 191 lb 6 oz (86.8 kg)   SpO2 98%   BMI 27.46 kg/m   Patient's Medications  New Prescriptions   No medications on file  Previous Medications   ACIDOPHILUS (RISAQUAD) CAPS CAPSULE    Take 2 capsules by mouth daily.   ASPIRIN EC 81 MG TABLET    Take 1 tablet (81 mg total) by mouth daily.   CARVEDILOL (COREG) 3.125 MG TABLET    Take 1 tablet (3.125 mg total) by mouth 2 (two) times daily with a meal.   CHLORHEXIDINE GLUCONATE CLOTH 2 % PADS    Apply 6 each topically daily at 6 (six) AM.   FAMOTIDINE (PEPCID) 20 MG TABLET    Take 1 tablet (20 mg total) by mouth daily.   FERROUS SULFATE 325 (65 FE) MG TABLET    Take 1 tablet (325 mg total) by mouth 3 (three) times daily with meals.   FOLIC ACID (FOLVITE) 1 MG TABLET    Take 1 tablet (1 mg total) by mouth daily.   GABAPENTIN (NEURONTIN) 600 MG TABLET    Take 600 mg by mouth 3 (three) times daily.   INSULIN ASPART (NOVOLOG) 100 UNIT/ML INJECTION    Inject 3 Units into the skin 3 (three) times daily  before meals.   INSULIN GLARGINE (LANTUS) 100 UNIT/ML INJECTION    Inject 24 Units into the skin at bedtime.    METFORMIN (GLUCOPHAGE) 500 MG TABLET    Take 500 mg by mouth daily with breakfast.   MULTIPLE VITAMINS-MINERALS (DECUBI-VITE PO)    Take 1 capsule by mouth daily.   MUPIROCIN OINTMENT (BACTROBAN) 2 %    Place 1 application into the nose 2 (two) times daily.   OXYCODONE (OXY-IR) 5 MG CAPSULE    Take  2 capsules (10 mg total) by mouth every 4 (four) hours as needed for pain.   POLYETHYLENE GLYCOL (MIRALAX / GLYCOLAX) PACKET    Take 17 g by mouth daily.   SENNA-DOCUSATE (SENOKOT-S) 8.6-50 MG TABLET    Take 2 tablets by mouth 2 (two) times daily.   SENNA-DOCUSATE (SENOKOT-S) 8.6-50 MG TABLET    Take 2 tablets by mouth daily as needed for mild constipation.   SIMVASTATIN (ZOCOR) 20 MG TABLET    Take 20 mg by mouth daily at 6 PM.   TAMSULOSIN (FLOMAX) 0.4 MG CAPS CAPSULE    Take 1 capsule (0.4 mg total) by mouth daily.   THIAMINE 100 MG TABLET    Take 1 tablet (100 mg total) by mouth daily.   TORSEMIDE (DEMADEX) 20 MG TABLET    Take 20 mg by mouth daily.    VITAMIN B-12 500 MCG TABLET    Take 1 tablet (500 mcg total) by mouth daily.  Modified Medications   No medications on file  Discontinued Medications   MULTIPLE VITAMIN (MULTIVITAMIN WITH MINERALS) TABS TABLET    Take 1 tablet by mouth daily.     SIGNIFICANT DIAGNOSTIC EXAMS  08-03-15: TEE: - Left ventricle: Septal and apical hypokinesis. The cavity size was mildly dilated. Wall thickness was increased in a pattern of moderate LVH. Systolic function was mildly reduced. The estimated ejection fraction was in the range of 45% to 50%. Wall motion was normal; there were no regional wall motion abnormalities. Left ventricular diastolic function parameters were normal. - Aortic valve: Severely calcified non coronary cusp. - Mitral valve: Calcified annulus. Mildly thickened leaflets . - Atrial septum: No defect or patent foramen ovale was  identified.  09-05-15: chest x-ray: No acute cardiopulmonary disease.    LABS REVIEWED:   08-31-15: wbc 10.7; hgb 8.7; hct 27.5; mcv 86.8; plt 453; glucose 163; bun 32; creat 1.42; k+4.1; na++132; liver normal albumin 2.6 08-26-15: wbc 9.1; hgb 8.9; hct 28.5; mcv 86.6; plt 572; glucose 140; bun 26; creat 1.18; k+ 4.4; na++132 09-01-15: wbc 8.0; hgb 8.7; hct 27.6;mcv 87.9; plt 456; glucose 128; bun 33; creat 1.26; k+ 5.5; na++ 134 09-04-15: glucose 117; bun 24; creat 1.02; k+ 4.7; na++ 134  09-18-15: glucose 166; bun 10.4; creat 0.93; k+ 4.1; na++ 140  11-25-15: wbc 8.9; hgb 6.3; hct 70.6; mcv 76.3; plt 462; glucose 397; bun 14; creat 1.06; k+ 4.1; na++ 129 11-26-15; hgb a1c 7.6; vit B 12: 287; iron 67; tibc 451; ferritin 8; ldh 149     Review of Systems  Constitutional: Negative for malaise/fatigue.  Respiratory: Negative for cough and shortness of breath.   Cardiovascular: Negative for chest pain, palpitations has right lower extremity edema  Gastrointestinal: Negative for abdominal pain, constipation and heartburn.  Musculoskeletal: Negative for back pain, joint pain and myalgias.  Skin: Negative.   Neurological: Negative for dizziness.  Psychiatric/Behavioral: The patient is not nervous/anxious.     Physical Exam  Constitutional: No distress.  Eyes: Conjunctivae are normal.  Neck: Neck supple. No JVD present. No thyromegaly present.  Cardiovascular: Normal rate, regular rhythm and intact distal pulses.   Respiratory: Effort normal and breath sounds normal. No respiratory distress. He has no wheezes.  GI: Soft. Bowel sounds are normal. He exhibits no distension. There is no tenderness.  Musculoskeletal: 2+ right lower extremity edema   Able to move all extremities  Status post left bka  Lymphadenopathy:    He has no cervical adenopathy.  Neurological: He is alert.  Skin: Skin is warm and dry. He is not diaphoretic.  Psychiatric: He has a normal mood and affect.      ASSESSMENT/ PLAN:   1. Hypertension: will continue coreg 3.125 mg twice daily asa 81 mg daily  B/p recheck 148/80  2. PUD: will continue pepcid 20 mg daily  3. Chronic hepatitis C: status post Harvoni treatment  is followed by GI: will monitor  4. Diabetes:  hgb a1c 7.6 will continue lantus 24 units nightly and novolog 3 units with meals; metformin 500 mg daily  5. Dyslipidemia: will continue zocor 20 mg daily  6. BPH: will continue flomax daily   7. Constipation: will continue miralax daily and senna s 2 tabs twice daily   8.  PVD: is status post left bka; will continue oxycodone 10 mg  every 4 hours as needed  9. Phantom limb pain: will continue neurontin 600 mg three times daily   10. CKD stage III: bun 14; creat 1.06; will monitor  11.  CVA: neurologically stable will continue asa 81 mg daily   12. Anemia of chronic disease: will continue to monitor hgb 8.5  Time spent with patient  50  minutes >50% time spent counseling; reviewing medical record; tests; labs; and developing future plan of care   MD is aware of resident's narcotic use and is in agreement with current plan of care. We will attempt to wean resident as appropriate.    Ok Edwards NP [facility]  Contact [phone] Monday through Friday 8am- 5pm  After hours call [phone]

## 2015-11-29 LAB — LIPID PANEL
Cholesterol: 142 mg/dL (ref 0–200)
HDL: 43 mg/dL (ref 35–70)
LDL Cholesterol: 84 mg/dL
TRIGLYCERIDES: 75 mg/dL (ref 40–160)

## 2015-11-29 LAB — CBC AND DIFFERENTIAL
HCT: 28 % — AB (ref 41–53)
HEMOGLOBIN: 8.5 g/dL — AB (ref 13.5–17.5)
Platelets: 481 10*3/uL — AB (ref 150–399)
WBC: 10.1 10^3/mL

## 2015-11-29 LAB — BASIC METABOLIC PANEL
BUN: 13 mg/dL (ref 4–21)
Creatinine: 1 mg/dL (ref 0.6–1.3)
GLUCOSE: 194 mg/dL
POTASSIUM: 4.1 mmol/L (ref 3.4–5.3)
SODIUM: 135 mmol/L — AB (ref 137–147)

## 2015-11-30 ENCOUNTER — Non-Acute Institutional Stay (SKILLED_NURSING_FACILITY): Payer: Medicaid Other | Admitting: Internal Medicine

## 2015-11-30 ENCOUNTER — Encounter: Payer: Self-pay | Admitting: Internal Medicine

## 2015-11-30 DIAGNOSIS — B348 Other viral infections of unspecified site: Secondary | ICD-10-CM

## 2015-11-30 DIAGNOSIS — E1151 Type 2 diabetes mellitus with diabetic peripheral angiopathy without gangrene: Secondary | ICD-10-CM | POA: Diagnosis not present

## 2015-11-30 DIAGNOSIS — J309 Allergic rhinitis, unspecified: Secondary | ICD-10-CM

## 2015-11-30 DIAGNOSIS — I10 Essential (primary) hypertension: Secondary | ICD-10-CM | POA: Diagnosis not present

## 2015-11-30 DIAGNOSIS — Z89512 Acquired absence of left leg below knee: Secondary | ICD-10-CM | POA: Diagnosis not present

## 2015-11-30 DIAGNOSIS — S88112A Complete traumatic amputation at level between knee and ankle, left lower leg, initial encounter: Secondary | ICD-10-CM

## 2015-11-30 DIAGNOSIS — E871 Hypo-osmolality and hyponatremia: Secondary | ICD-10-CM

## 2015-11-30 DIAGNOSIS — D638 Anemia in other chronic diseases classified elsewhere: Secondary | ICD-10-CM | POA: Diagnosis not present

## 2015-11-30 NOTE — Progress Notes (Signed)
Patient ID: Cory Weber, male   DOB: [birth year], 65 y.o.   MRN: [id number]    HISTORY AND PHYSICAL   DATE:  11/30/2015  Location:    [facility] Room Number: 756 B Place of Service: SNF (31)   Extended Emergency Contact Information Primary Emergency Contact: Tye Savoy, [place] [address] [place] [employer] Phone: [phone] Relation: Sister Secondary Emergency Contact: Kerrin Mo, [place] [address] [place] [employer] Phone: [phone] Relation: Sister  Advanced Directive information Does patient have an advance directive?: Yes, Does patient want to make changes to advanced directive?: No - Patient declined [place] Chief Complaint  Patient presents with  . Readmit To SNF    HPI:  65 yo male seen today as a readmission into SNF following hospital stay for symptomatic anemia, iron deficiency anemia, rhinovirus bronchiolitis, chronic Hep C s/p Harvoni, DM2, HTN, PAD, s/p left BKA due to nonhealing foot ulcer, AOCD, hyponatremia, hx CVA. He presented to ED with cough. CT chest revealed findings c/w bronchiolitis and respiratory panel (+) rhinovirus. Fecal occult blood test neg. He rec'd 2 units PRBCs. He was set up for EGD/colonoscopy later this year. H/H 6.3/20.6-->8.5/27; ferritin 8; iron 67; folate nml; B12 287; A1c 7.6%; Na 129-->135; Cr 0.97 at d/c. He presents to SNF for long term care.  Today, he reports feeling better. He has nasal congestion, cough and post nasal drip. No f/c. Fatigue improving. No nursing issues. No falls.   Hypertension - BP stable on coreg 3.125 mg twice daily; ASA 81 mg daily and torsemide 97m daily  PUD - stable on pepcid 20 mg daily  Chronic hepatitis C- s/p Harvoni tx. followed by GI  DM - CBG 325 yesterday. Takes lantus 24 units nightly and novolog 3 units with meals; metformin 500 mg daily. A1c 7.6%  Hyperlipidemia - stable on zocor 20 mg daily. LDL 84  BPH - stable on  flomax daily   Constipation - stable on miralax daily and senna s 2 tabs twice daily   PAD - s/p left BKA. Pain stable on percoet 5/325 mg 1 or 2 tabs every 4 hours as needed  Phantom limb pain - stable on neurontin 600 mg three times daily   CKD  - stable. Cr 1.0  Hx CVA - stable on asa 81 mg daily   Anemia of chronic disease - due to iron deficiency +/- low B12 level. He is s/p 2 units PRBCs. [facility]5 [facility] d[facility]c [facility] He takes iron supplement TID, folate, thiamine and B12.    Past Medical History:  Diagnosis Date  . Anemia   . Asthma   . Cellulitis and abscess of foot 07/2015  . CVA (cerebral vascular accident) ([place]   denies residual on 08/15/2015  . Hepatitis C    states [place] . Hyperlipidemia   . Hypertension   . Peripheral neuropathy ([place]   . Pneumonia X 1  . PVD (peripheral vascular disease) ([place]   . Type II diabetes mellitus ([place]     Past Surgical History:  Procedure Laterality Date  . AMPUTATION Left 08/16/2015   Procedure: LEFT BELOW THE KNEE AMPUTATION ;  Surgeon: VSerafina Mitchell MD;  [place] [facility]  Service: Vascular;  Laterality: Left;  . BACK SURGERY    .  BIOPSY N/A 09/03/2012   Procedure: BIOPSY;  Surgeon: Daneil Dolin, MD;  Location: [facility];  Service: Endoscopy;  Laterality: N/A;  gastric and gastric mucosa  . BIOPSY N/A 12/03/2012   Procedure: BIOPSY;  Surgeon: Daneil Dolin, MD;  Location: [facility];  Service: Endoscopy;  Laterality: N/A;  . COLONOSCOPY WITH PROPOFOL N/A 09/03/2012   OHY:WVPXTGG polyp-removed as outlined above. Prominent internal hemorrhoids. Tubular adenoma  . ESOPHAGOGASTRODUODENOSCOPY (EGD) WITH PROPOFOL N/A 09/03/2012   YIR:SWNIOE hernia. Gastric diverticulum. Gastric ulcers with associated erosions. Duodenal erosions. Status post gastric biopsy. H.PYLORI gastritis   . ESOPHAGOGASTRODUODENOSCOPY (EGD) WITH PROPOFOL N/A 12/03/2012   Dr.  Gala Romney: gastric diverticulum, gastric erosions and scar. Previously noted gastric ulcer completed healed. Biopsy without H.pylori.   Marland Kitchen LIVER BIOPSY  2005   Done in [place], [place]. Chronic hepatitis with mild periportal inflammation, lobular unicellular necrosis and portal fibrosis. Grade 2, stage 1-2.  Marland Kitchen MAXIMUM ACCESS (MAS)POSTERIOR LUMBAR INTERBODY FUSION (PLIF) 1 LEVEL N/A 11/25/2013   Procedure: FOR MAXIMUM ACCESS (MAS) POSTERIOR LUMBAR INTERBODY FUSION (PLIF) 1 LEVEL;  Surgeon: Eustace Moore, MD;  Location: [facility];  Service: Neurosurgery;  Laterality: N/A;  FOR MAXIMUM ACCESS (MAS) POSTERIOR LUMBAR INTERBODY FUSION (PLIF) 1 LEVEL LUMBAR 3-4  . PERIPHERAL VASCULAR CATHETERIZATION Left 08/01/2015   Procedure: Lower Extremity Angiography;  Surgeon: Serafina Mitchell, MD;  Location: [place] [facility];  Service: Cardiovascular;  Laterality: Left;  . PERIPHERAL VASCULAR CATHETERIZATION N/A 08/01/2015   Procedure: Abdominal Aortogram;  Surgeon: Serafina Mitchell, MD;  Location: [facility];  Service: Cardiovascular;  Laterality: N/A;  . PERIPHERAL VASCULAR CATHETERIZATION N/A 08/08/2015   Procedure: Abdominal Aortogram w/Lower Extremity;  Surgeon: Serafina Mitchell, MD;  Location: [facility];  Service: Cardiovascular;  Laterality: N/A;  . PERIPHERAL VASCULAR CATHETERIZATION Left 08/08/2015   Procedure: Peripheral Vascular Balloon Angioplasty;  Surgeon: Serafina Mitchell, MD;  Location: [place] [facility];  Service: Cardiovascular;  Laterality: Left;  left popiteal artery, left peronealtrunk, left post tibial  . POLYPECTOMY N/A 09/03/2012   Procedure: POLYPECTOMY;  Surgeon: Daneil Dolin, MD;  Location: [facility];  Service: Endoscopy;  Laterality: N/A;  cecal polyp  . TONSILLECTOMY      Patient Care Team: Iona Beard, MD as PCP - General (Family Medicine) Daneil Dolin, MD as Consulting Physician (Gastroenterology) Herminio Commons, MD as Attending Physician (Cardiology)  Social History    Social History  . Marital status: Divorced    Spouse name: N/A  . Number of children: 1  . Years of education: N/A   Occupational History  . trying to get disability    Social History Main Topics  . Smoking status: Former Smoker    Packs/day: 1.00    Years: 47.00    Types: Cigarettes    Quit date: 07/13/2015  . Smokeless tobacco: Never Used  . Alcohol use 0.0 oz/week     Comment: 08/15/2015 "stopped drinking 3-4 years ago"  . Drug use:     Types: Cocaine     Comment: 08/15/2015 "last used cocaine 3-4 months ago"  . Sexual activity: No   Other Topics Concern  . Not on file   Social History Narrative  . No narrative on file     reports that he quit smoking about 4 months ago. His smoking use included Cigarettes. He has a 47.00 pack-year smoking history. He has never used smokeless tobacco. He reports that he drinks alcohol. He reports that he  uses drugs, including Cocaine.  Family History  Problem Relation Age of Onset  . Breast cancer Mother   . Heart failure Mother   . Diabetes Father   . Hypertension Father   . Heart disease Father   . Hyperlipidemia Father   . Diabetes Sister   . Hypertension Sister   . Breast cancer Sister   . Colon cancer Neg Hx   . Liver disease Neg Hx    Family Status  Relation Status  . Mother Deceased  . Father Deceased  . Sister   . Sister   . Neg Hx     Immunization History  Administered Date(s) Administered  . Influenza,inj,Quad PF,36+ Mos 11/24/2013  . Influenza-Unspecified 11/16/2015  . PPD Test 09/20/2015, 10/27/2015  . Pneumococcal Polysaccharide-23 11/24/2013    No Known Allergies  Medications: Patient's Medications  New Prescriptions   No medications on file  Previous Medications   ACIDOPHILUS (RISAQUAD) CAPS CAPSULE    Take 2 capsules by mouth daily.   ASPIRIN EC 81 MG TABLET    Take 1 tablet (81 mg total) by mouth daily.   CARVEDILOL (COREG) 3.125 MG TABLET    Take 1 tablet (3.125 mg total) by mouth 2 (two)  times daily with a meal.   CHLORHEXIDINE GLUCONATE CLOTH 2 % PADS    Apply 6 each topically daily at 6 (six) AM.   FAMOTIDINE (PEPCID) 20 MG TABLET    Take 1 tablet (20 mg total) by mouth daily.   FERROUS SULFATE 325 (65 FE) MG TABLET    Take 1 tablet (325 mg total) by mouth 3 (three) times daily with meals.   FOLIC ACID (FOLVITE) 1 MG TABLET    Take 1 tablet (1 mg total) by mouth daily.   GABAPENTIN (NEURONTIN) 600 MG TABLET    Take 600 mg by mouth 3 (three) times daily.   INSULIN ASPART (NOVOLOG) 100 UNIT/ML INJECTION    Inject 3 Units into the skin 3 (three) times daily before meals.   INSULIN GLARGINE (LANTUS) 100 UNIT/ML INJECTION    Inject 24 Units into the skin at bedtime.    METFORMIN (GLUCOPHAGE) 500 MG TABLET    Take 500 mg by mouth daily with breakfast.   MULTIPLE VITAMINS-MINERALS (DECUBI-VITE PO)    Take 1 capsule by mouth daily.   MUPIROCIN OINTMENT (BACTROBAN) 2 %    Place 1 application into the nose 2 (two) times daily.   OXYCODONE HCL, ABUSE DETER, (OXAYDO) 5 MG TABA    Take 1-2 tablets by mouth every 4 (four) hours as needed.   POLYETHYLENE GLYCOL (MIRALAX / GLYCOLAX) PACKET    Take 17 g by mouth daily.   SENNA-DOCUSATE (SENOKOT-S) 8.6-50 MG TABLET    Take 2 tablets by mouth 2 (two) times daily.   SENNA-DOCUSATE (SENOKOT-S) 8.6-50 MG TABLET    Take 2 tablets by mouth daily as needed for mild constipation.   SIMVASTATIN (ZOCOR) 20 MG TABLET    Take 20 mg by mouth daily at 6 PM.   TAMSULOSIN (FLOMAX) 0.4 MG CAPS CAPSULE    Take 1 capsule (0.4 mg total) by mouth daily.   THIAMINE 100 MG TABLET    Take 1 tablet (100 mg total) by mouth daily.   TORSEMIDE (DEMADEX) 20 MG TABLET    Take 20 mg by mouth daily.    VITAMIN B-12 500 MCG TABLET    Take 1 tablet (500 mcg total) by mouth daily.  Modified Medications   No medications on file  Discontinued Medications   OXYCODONE (OXY-IR) 5 MG CAPSULE    Take 2 capsules (10 mg total) by mouth every 4 (four) hours as needed for pain.     Review of Systems  Constitutional: Positive for fatigue.  HENT: Positive for congestion and postnasal drip.   Respiratory: Positive for cough.   All other systems reviewed and are negative.   Vitals:   11/30/15 0937  BP: (!) 193/73  Pulse: 70  Resp: 18  Temp: 97.3 F (36.3 C)  TempSrc: Oral  SpO2: 98%  Weight: 191 lb 9.6 oz (86.9 kg)  Height: 5' 10"  (1.778 m)   Body mass index is 27.49 kg/m.  Physical Exam  Constitutional: He is oriented to person, place, and time. He appears well-developed and well-nourished.  Sitting in w/c in NAD  HENT:  Nares with enlarged grey dry turbinates. Oropharynx cobblestoning and red but no exudate  Eyes: Pupils are equal, round, and reactive to light. No scleral icterus.  Neck: Neck supple. Carotid bruit is not present. No thyromegaly present.  Cardiovascular: Normal rate, regular rhythm and intact distal pulses.  Exam reveals no gallop and no friction rub.   Murmur (2/6 SEM) heard. Left BKA; No RLE edema. No right calf TTP  Pulmonary/Chest: Effort normal and breath sounds normal. He has no wheezes. He has no rales. He exhibits no tenderness.  Abdominal: Soft. Bowel sounds are normal. He exhibits no distension, no abdominal bruit, no pulsatile midline mass and no mass. There is no hepatomegaly. There is no tenderness. There is no rebound and no guarding.  Musculoskeletal: He exhibits edema and tenderness.  Left BKA  Lymphadenopathy:    He has no cervical adenopathy.  Neurological: He is alert and oriented to person, place, and time.  Skin: Skin is warm, dry and intact. No rash noted.  Psychiatric: He has a normal mood and affect. His behavior is normal. Thought content normal.     Labs reviewed: Nursing Home on 11/30/2015  Component Date Value Ref Range Status  . Hemoglobin 11/29/2015 8.5* 13.5 - 17.5 g/dL Final  . HCT 11/29/2015 28* 41 - 53 % Final  . Platelets 11/29/2015 481* 150 - 399 K/L Final  . WBC 11/29/2015 10.1  10^3/mL  Final  . Glucose 11/29/2015 194  mg/dL Final  . BUN 11/29/2015 13  4 - 21 mg/dL Final  . Creatinine 11/29/2015 1.0  0.6 - 1.3 mg/dL Final  . Potassium 11/29/2015 4.1  3.4 - 5.3 mmol/L Final  . Sodium 11/29/2015 135* 137 - 147 mmol/L Final  . Triglycerides 11/29/2015 75  40 - 160 mg/dL Final  . Cholesterol 11/29/2015 142  0 - 200 mg/dL Final  . HDL 11/29/2015 43  35 - 70 mg/dL Final  . LDL Cholesterol 11/29/2015 84  mg/dL Final  Admission on 11/25/2015, Discharged on 11/27/2015  Component Date Value Ref Range Status  . WBC 11/25/2015 8.9  4.0 - 10.5 K/uL Final  . RBC 11/25/2015 2.70* 4.22 - 5.81 MIL/uL Final  . Hemoglobin 11/25/2015 6.3* 13.0 - 17.0 g/dL Final   Comment: REPEATED TO VERIFY CRITICAL RESULT CALLED TO, READ BACK BY AND VERIFIED WITH: MJIHI,O RN 10.14.17 @2336  ZANDO,C   . HCT 11/25/2015 20.6* 39.0 - 52.0 % Final  . MCV 11/25/2015 76.3* 78.0 - 100.0 fL Final  . MCH 11/25/2015 23.3* 26.0 - 34.0 pg Final  . MCHC 11/25/2015 30.6  30.0 - 36.0 g/dL Final  . RDW 11/25/2015 14.6  11.5 - 15.5 % Final  . Platelets 11/25/2015  462* 150 - 400 K/uL Final  . Neutrophils Relative % 11/25/2015 65  % Final  . Lymphocytes Relative 11/25/2015 24  % Final  . Monocytes Relative 11/25/2015 8  % Final  . Eosinophils Relative 11/25/2015 3  % Final  . Basophils Relative 11/25/2015 0  % Final  . Neutro Abs 11/25/2015 5.8  1.7 - 7.7 K/uL Final  . Lymphs Abs 11/25/2015 2.1  0.7 - 4.0 K/uL Final  . Monocytes Absolute 11/25/2015 0.7  0.1 - 1.0 K/uL Final  . Eosinophils Absolute 11/25/2015 0.3  0.0 - 0.7 K/uL Final  . Basophils Absolute 11/25/2015 0.0  0.0 - 0.1 K/uL Final  . RBC Morphology 11/25/2015 POLYCHROMASIA PRESENT   Final  . Sodium 11/25/2015 129* 135 - 145 mmol/L Final  . Potassium 11/25/2015 4.1  3.5 - 5.1 mmol/L Final  . Chloride 11/25/2015 94* 101 - 111 mmol/L Final  . CO2 11/25/2015 27  22 - 32 mmol/L Final  . Glucose, Bld 11/25/2015 397* 65 - 99 mg/dL Final  . BUN 11/25/2015 14  6  - 20 mg/dL Final  . Creatinine, Ser 11/25/2015 1.06  0.61 - 1.24 mg/dL Final  . Calcium 11/25/2015 8.7* 8.9 - 10.3 mg/dL Final  . GFR calc non Af Amer 11/25/2015 >60  >60 mL/min Final  . GFR calc Af Amer 11/25/2015 >60  >60 mL/min Final   Comment: (NOTE) The eGFR has been calculated using the CKD EPI equation. This calculation has not been validated in all clinical situations. eGFR's persistently <60 mL/min signify possible Chronic Kidney Disease.   . Anion gap 11/25/2015 8  5 - 15 Final  . ABO/RH(D) 11/27/2015 O POS   Final  . Antibody Screen 11/27/2015 NEG   Final  . Sample Expiration 11/27/2015 11/29/2015   Final  . Unit Number 11/27/2015 [id number]   Final  . Blood Component Type 11/27/2015 RBC LR PHER1   Final  . Unit division 11/27/2015 00   Final  . Status of Unit 11/27/2015 ISSUED,FINAL   Final  . Transfusion Status 11/27/2015 OK TO TRANSFUSE   Final  . Crossmatch Result 11/27/2015 Compatible   Final  . Unit Number 11/27/2015 [id number]   Final  . Blood Component Type 11/27/2015 RED CELLS,LR   Final  . Unit division 11/27/2015 00   Final  . Status of Unit 11/27/2015 ISSUED,FINAL   Final  . Transfusion Status 11/27/2015 OK TO TRANSFUSE   Final  . Crossmatch Result 11/27/2015 Compatible   Final  . Order Confirmation 11/26/2015 ORDER PROCESSED BY BLOOD BANK   Final  . Fecal Occult Bld 11/26/2015 NEGATIVE  NEGATIVE Final  . ABO/RH(D) 11/26/2015 O POS   Final  . Glucose-Capillary 11/26/2015 284* 65 - 99 mg/dL Final  . Glucose-Capillary 11/26/2015 212* 65 - 99 mg/dL Final  . Comment 1 11/26/2015 Notify RN   Final  . Adenovirus 11/26/2015 NOT DETECTED  NOT DETECTED Final  . Coronavirus 229E 11/26/2015 NOT DETECTED  NOT DETECTED Final  . Coronavirus HKU1 11/26/2015 NOT DETECTED  NOT DETECTED Final  . Coronavirus NL63 11/26/2015 NOT DETECTED  NOT DETECTED Final  . Coronavirus OC43 11/26/2015 NOT DETECTED  NOT DETECTED Final  . Metapneumovirus 11/26/2015 NOT DETECTED  NOT  DETECTED Final  . Rhinovirus / Enterovirus 11/26/2015 DETECTED* NOT DETECTED Final  . Influenza A 11/26/2015 NOT DETECTED  NOT DETECTED Final  . Influenza B 11/26/2015 NOT DETECTED  NOT DETECTED Final  . Parainfluenza Virus 1 11/26/2015 NOT DETECTED  NOT DETECTED Final  . Parainfluenza Virus  2 11/26/2015 NOT DETECTED  NOT DETECTED Final  . Parainfluenza Virus 3 11/26/2015 NOT DETECTED  NOT DETECTED Final  . Parainfluenza Virus 4 11/26/2015 NOT DETECTED  NOT DETECTED Final  . Respiratory Syncytial Virus 11/26/2015 NOT DETECTED  NOT DETECTED Final  . Bordetella pertussis 11/26/2015 NOT DETECTED  NOT DETECTED Final  . Chlamydophila pneumoniae 11/26/2015 NOT DETECTED  NOT DETECTED Final  . Mycoplasma pneumoniae 11/26/2015 NOT DETECTED  NOT DETECTED Final  . Hgb A1c MFr Bld 11/27/2015 7.6* 4.8 - 5.6 % Final   Comment: (NOTE)         Pre-diabetes: 5.7 - 6.4         Diabetes: >6.4         Glycemic control for adults with diabetes: <7.0   . Mean Plasma Glucose 11/27/2015 171  mg/dL Final   Comment: (NOTE) Performed At: [facility] [place], [place] [id number] Lindon Romp MD TG:[id number]   . Osmolality 11/26/2015 292  275 - 295 mOsm/kg Final  . Vitamin B-12 11/26/2015 287  180 - 914 pg/mL Final   Comment: (NOTE) This assay is not validated for testing neonatal or myeloproliferative syndrome specimens for Vitamin B12 levels. Performed at [facility]   . Folate, Hemolysate 11/27/2015 496.5  Not Estab. ng/mL Final  . Hematocrit 11/27/2015 26.3* 37.5 - 51.0 % Final  . Folate, RBC 11/27/2015 1888  >498 ng/mL Final   Comment: (NOTE) Performed At: [facility] [place], [place] [id number] Lindon Romp MD HW:[id number]   . Iron 11/26/2015 67  45 - 182 ug/dL Final  . TIBC 11/26/2015 451* 250 - 450 ug/dL Final  . Saturation Ratios 11/26/2015 15* 17.9 - 39.5 % Final  . UIBC 11/26/2015 384  ug/dL Final  . Ferritin 11/26/2015 8* 24 -  336 ng/mL Final  . LDH 11/26/2015 149  98 - 192 U/L Final  . Haptoglobin 11/27/2015 289* 34 - 200 mg/dL Final   Comment: (NOTE) Performed At: [facility] [place], [place] [id number] Lindon Romp MD ZW:[id number]   . Retic Ct Pct 11/26/2015 1.5  0.4 - 3.1 % Final  . RBC. 11/26/2015 3.52* 4.22 - 5.81 MIL/uL Final  . Retic Count, Manual 11/26/2015 52.8  19.0 - 186.0 K/uL Final  . Glucose-Capillary 11/26/2015 151* 65 - 99 mg/dL Final  . Comment 1 11/26/2015 Notify RN   Final  . Comment 2 11/26/2015 Document in Chart   Final  . WBC 11/26/2015 9.6  4.0 - 10.5 K/uL Final  . RBC 11/26/2015 3.51* 4.22 - 5.81 MIL/uL Final  . Hemoglobin 11/26/2015 8.4* 13.0 - 17.0 g/dL Final   Comment: DELTA CHECK NOTED REPEATED TO VERIFY POST TRANSFUSION SPECIMEN   . HCT 11/26/2015 26.8* 39.0 - 52.0 % Final  . MCV 11/26/2015 76.4* 78.0 - 100.0 fL Final  . MCH 11/26/2015 23.9* 26.0 - 34.0 pg Final  . MCHC 11/26/2015 31.3  30.0 - 36.0 g/dL Final  . RDW 11/26/2015 14.7  11.5 - 15.5 % Final  . Platelets 11/26/2015 481* 150 - 400 K/uL Final  . Neutrophils Relative % 11/26/2015 67  % Final  . Neutro Abs 11/26/2015 6.4  1.7 - 7.7 K/uL Final  . Lymphocytes Relative 11/26/2015 23  % Final  . Lymphs Abs 11/26/2015 2.2  0.7 - 4.0 K/uL Final  . Monocytes Relative 11/26/2015 8  % Final  . Monocytes Absolute 11/26/2015 0.8  0.1 - 1.0 K/uL Final  . Eosinophils Relative 11/26/2015 2  %  Final  . Eosinophils Absolute 11/26/2015 0.2  0.0 - 0.7 K/uL Final  . Basophils Relative 11/26/2015 0  % Final  . Basophils Absolute 11/26/2015 0.0  0.0 - 0.1 K/uL Final  . Sodium 11/26/2015 136  135 - 145 mmol/L Final  . Potassium 11/26/2015 4.0  3.5 - 5.1 mmol/L Final  . Chloride 11/26/2015 99* 101 - 111 mmol/L Final  . CO2 11/26/2015 30  22 - 32 mmol/L Final  . Glucose, Bld 11/26/2015 175* 65 - 99 mg/dL Final  . BUN 11/26/2015 12  6 - 20 mg/dL Final  . Creatinine, Ser 11/26/2015 0.97  0.61 - 1.24 mg/dL  Final  . Calcium 11/26/2015 9.4  8.9 - 10.3 mg/dL Final  . GFR calc non Af Amer 11/26/2015 >60  >60 mL/min Final  . GFR calc Af Amer 11/26/2015 >60  >60 mL/min Final   Comment: (NOTE) The eGFR has been calculated using the CKD EPI equation. This calculation has not been validated in all clinical situations. eGFR's persistently <60 mL/min signify possible Chronic Kidney Disease.   . Anion gap 11/26/2015 7  5 - 15 Final  . Glucose-Capillary 11/26/2015 223* 65 - 99 mg/dL Final  . Comment 1 11/26/2015 Notify RN   Final  . Comment 2 11/26/2015 Document in Chart   Final  . MRSA by PCR 11/26/2015 POSITIVE* NEGATIVE Final   Comment:        The GeneXpert MRSA Assay (FDA approved for NASAL specimens only), is one component of a comprehensive MRSA colonization surveillance program. It is not intended to diagnose MRSA infection nor to guide or monitor treatment for MRSA infections. CRITICAL RESULT CALLED TO, READ BACK BY AND VERIFIED WITH: BETHAL,E RN [id number] COVINGTON,N   . Hemoglobin 11/26/2015 8.3* 13.0 - 17.0 g/dL Final   Comment: DELTA CHECK NOTED REPEATED TO VERIFY POST TRANSFUSION SPECIMEN   . HCT 11/26/2015 27.0* 39.0 - 52.0 % Final  . Glucose-Capillary 11/26/2015 149* 65 - 99 mg/dL Final  . Comment 1 11/26/2015 Notify RN   Final  . Comment 2 11/26/2015 Document in Chart   Final  . Glucose-Capillary 11/26/2015 186* 65 - 99 mg/dL Final  . Glucose-Capillary 11/27/2015 252* 65 - 99 mg/dL Final  . Comment 1 11/27/2015 Notify RN   Final  . Comment 2 11/27/2015 Document in Chart   Final  . Hemoglobin 11/27/2015 8.5* 13.0 - 17.0 g/dL Final  . HCT 11/27/2015 27.0* 39.0 - 52.0 % Final  . Glucose-Capillary 11/27/2015 204* 65 - 99 mg/dL Final  . Comment 1 11/27/2015 Notify RN   Final  . Comment 2 11/27/2015 Document in Chart   Final  Nursing Home on 11/14/2015  Component Date Value Ref Range Status  . Hemoglobin 10/28/2015 7.4* 13.5 - 17.5 g/dL Final  . HCT 10/28/2015 24* 41 -  53 % Final  . Platelets 10/28/2015 538* 150 - 399 K/L Final  . WBC 10/28/2015 9.0  10^3/mL Final  Appointment on 10/19/2015  Component Date Value Ref Range Status  . WBC 10/19/2015 8.9  4.0 - 10.5 K/uL Final  . RBC 10/19/2015 2.70* 4.22 - 5.81 Mil/uL Final  . Hemoglobin 10/19/2015 7.2 Repeated and verified X2.* 13.0 - 17.0 g/dL Final  . HCT 10/19/2015 21.7 Repeated and verified X2.* 39.0 - 52.0 % Final  . MCV 10/19/2015 80.2  78.0 - 100.0 fl Final  . MCHC 10/19/2015 33.1  30.0 - 36.0 g/dL Final  . RDW 10/19/2015 15.5  11.5 - 15.5 % Final  . Platelets 10/19/2015  417.0* 150.0 - 400.0 K/uL Final  . Neutrophils Relative % 10/19/2015 71.1  43.0 - 77.0 % Final  . Lymphocytes Relative 10/19/2015 16.3  12.0 - 46.0 % Final  . Monocytes Relative 10/19/2015 9.8  3.0 - 12.0 % Final  . Eosinophils Relative 10/19/2015 2.3  0.0 - 5.0 % Final  . Basophils Relative 10/19/2015 0.5  0.0 - 3.0 % Final  . Neutro Abs 10/19/2015 6.3  1.4 - 7.7 K/uL Final  . Lymphs Abs 10/19/2015 1.4  0.7 - 4.0 K/uL Final  . Monocytes Absolute 10/19/2015 0.9  0.1 - 1.0 K/uL Final  . Eosinophils Absolute 10/19/2015 0.2  0.0 - 0.7 K/uL Final  . Basophils Absolute 10/19/2015 0.0  0.0 - 0.1 K/uL Final  . INR 10/19/2015 1.2* 0.8 - 1.0 ratio Final  . Prothrombin Time 10/19/2015 12.7  9.6 - 13.1 sec Final  . AFP-Tumor Marker 10/20/2015 1.4  <6.1 ng/mL Final   Comment: Patients < 22 month old: * Pediatric range is based on full term neonates, values for premature infants may be higher.   Male: The use of AFP as a Tumor Marker in pregnant patients is not recommended.   This test was performed using the Beckman Coulter chemiluminescent method. Values obtained from different assay methods cannot be used interchangeably. AFP levels, regardless of value, should not be interpreted as absolute evidence of the presence or absence of disease.   Nursing Home on 09/26/2015  Component Date Value Ref Range Status  . Glucose  09/18/2015 166  mg/dL Final  . BUN 09/18/2015 10  4 - 21 mg/dL Final  . Creatinine 09/18/2015 0.9  0.6 - 1.3 mg/dL Final  . Potassium 09/18/2015 4.1  3.4 - 5.3 mmol/L Final  . Sodium 09/18/2015 140  137 - 147 mmol/L Final  Nursing Home on 09/07/2015  Component Date Value Ref Range Status  . WBC 09/01/2015 8.1  10^3/mL Final  . Glucose 09/01/2015 128  mg/dL Final  . BUN 09/01/2015 33* 4 - 21 mg/dL Final  . Creatinine 09/01/2015 1.3  0.6 - 1.3 mg/dL Final  . Potassium 09/01/2015 5.5* 3.4 - 5.3 mmol/L Final  . Sodium 09/01/2015 134* 137 - 147 mmol/L Final  . Hemoglobin 08/30/2015 8.8* 13.5 - 17.5 g/dL Final  . HCT 08/30/2015 28* 41 - 53 % Final  . Platelets 08/30/2015 494* 150 - 399 K/L Final  . WBC 08/30/2015 7.3  10^3/mL Final  . Glucose 09/02/2015 115  mg/dL Final  . BUN 09/02/2015 27* 4 - 21 mg/dL Final  . Creatinine 09/02/2015 1.2  0.6 - 1.3 mg/dL Final  . Potassium 09/02/2015 5.3  3.4 - 5.3 mmol/L Final  . Sodium 09/02/2015 136* 137 - 147 mmol/L Final  . Glucose 09/04/2015 117  mg/dL Final  . BUN 09/04/2015 24* 4 - 21 mg/dL Final  . Creatinine 09/04/2015 1.0  0.6 - 1.3 mg/dL Final  . Sodium 09/04/2015 134* 137 - 147 mmol/L Final  Admission on 08/21/2015, Discharged on 09/05/2015  No results displayed because visit has over 200 results.      Dg Chest 2 View  Result Date: 11/25/2015 CLINICAL DATA:  Acute onset of cough. Diaphoresis. Initial encounter. EXAM: CHEST  2 VIEW COMPARISON:  Chest radiograph performed 09/05/2015 FINDINGS: The lungs are well-aerated and clear. There is no evidence of focal opacification, pleural effusion or pneumothorax. The heart is normal in size; the mediastinal contour is within normal limits. No acute osseous abnormalities are seen. Mild degenerative change is noted at the right acromioclavicular  joint. IMPRESSION: No acute cardiopulmonary process seen. Electronically Signed   By: Garald Balding M.D.   On: 11/25/2015 23:50   Ct Chest W  Contrast  Result Date: 11/26/2015 CLINICAL DATA:  65 y/o  M; coughing on black sputum. EXAM: CT CHEST WITH CONTRAST TECHNIQUE: Multidetector CT imaging of the chest was performed during intravenous contrast administration. CONTRAST:  10m ISOVUE-300 IOPAMIDOL (ISOVUE-300) INJECTION 61% COMPARISON:  01/25/2016 chest radiograph. FINDINGS: Cardiovascular: Aortic valvular calcification. Moderate coronary artery calcification. Normal heart size. Normal caliber thoracic aorta and main pulmonary arteries. Mild aortic atherosclerosis with arch calcification. Bovine arch anatomy, normal variant. Mediastinum/Nodes: No enlarged mediastinal, hilar, or axillary lymph nodes. Thyroid gland, trachea, and esophagus demonstrate no significant findings. Lungs/Pleura: Bilateral lung apex small clusters of nodules and "Tree in bud" opacities. No consolidation. No pleural effusion. No pneumothorax. Upper Abdomen: 20 mm left adrenal nodule measuring 10 HU consistent with adenoma (series 6, image 55). Musculoskeletal: Chronic right lateral and left anterior rib fracture deformities. No acute fracture or osseous abnormality is identified. Mild thoracic spondylosis. IMPRESSION: 1. Clusters of nodules and "tree-in-bud" opacities in the right greater than left lung apices compatible with bronchiolitis. 2. Aortic valvular calcification associated with aortic stenosis. 3. Aortic atherosclerosis. 4. Moderate coronary artery calcification. Electronically Signed   By: LKristine GarbeM.D.   On: 11/26/2015 02:20   UKoreaAbdomen Limited Ruq  Result Date: 11/03/2015 CLINICAL DATA:  Hepatitis-C and hepatic cirrhosis EXAM: [place]ABDOMEN LIMITED - RIGHT UPPER QUADRANT COMPARISON:  None. FINDINGS: Gallbladder: No gallstones or wall thickening visualized. No sonographic Murphy sign noted by sonographer. Common bile duct: Diameter: 3.2 mm. Liver: No focal lesion identified. Within normal limits in parenchymal echogenicity. IMPRESSION: No acute  abnormality noted. Electronically Signed   By: MInez CatalinaM.D.   On: 11/03/2015 11:33     Assessment/Plan   ICD-9-CM ICD-10-CM   1. Type II diabetes mellitus with peripheral circulatory disorder (HCC) - uncontrolled 250.70 E11.51    443.81    2. Anemia of chronic disease 285.29 D63.8   3. Allergic rhinitis, unspecified chronicity, unspecified seasonality, unspecified trigger 477.9 J30.9   4. Hyponatremia 276.1 E87.1   5. Essential hypertension 401.9 I10   6. Amputation of left lower extremity below knee (HCC) V49.75 Z89.512   7. Rhinovirus infection 079.3 B34.8      Increase lantus 28 units qhs  Increase Vitamin B12 to 10056m daily  Add zyrtec 1080maily for seasonal allergy  Add mucinex 1200m50mD for chest congestion  Cont other meds as ordered  F/u with specialists as scheduled  PT/OT/ST as ordered  GOAL: long term care. Communicated with pt and nursing.  Will follow  Patriciaann Rabanal S. CartPerlie GoldedMetairie Ophthalmology [facility] [id number] Gal[address] StreeteBaldwin [phone] (Monday-Friday 8 AM - 5 PM) ([phone] 5 PM and follow prompts

## 2015-12-07 ENCOUNTER — Ambulatory Visit (INDEPENDENT_AMBULATORY_CARE_PROVIDER_SITE_OTHER): Payer: Medicaid Other | Admitting: Gastroenterology

## 2015-12-07 ENCOUNTER — Encounter: Payer: Self-pay | Admitting: Gastroenterology

## 2015-12-07 VITALS — BP 170/90 | HR 80 | Ht 71.5 in

## 2015-12-07 DIAGNOSIS — D509 Iron deficiency anemia, unspecified: Secondary | ICD-10-CM

## 2015-12-07 NOTE — Patient Instructions (Signed)
Please have CBC drawn on 12/13/15 and fax to [phone] attention Lateefah Mallery L Continue iron supplement three times daily Keep appointment for EGD on 01/23/16 with Dr Loletha Carrow.

## 2015-12-07 NOTE — Progress Notes (Signed)
     12/07/2015 LAURIE LOVEJOY [id number] 03/13/1950   History of Present Illness:  This is a 65 year old male who is known to Dr. Loletha Carrow.  Had a recent visit in September at which time he was scheduled for an EGD at [place].  Has chronic anemia.  Iron studies are low and he was recently placed on ferrous sulfate 325 mg TID after his most recent hospitalization when he was transfused with 2 units PRBC's for a Hgb of 6.3 grams.  Most recent Hgb was eight days ago and was stable at 8.5 grams.  He denies any dark or bloody stools.  Anemia is thought in part to be from chronic disease.    Current Medications, Allergies, Past Medical History, Past Surgical History, Family History and Social History were reviewed in [employer] record.   Physical Exam: BP (!) 170/90   Pulse 80   Ht 5' 11.5" (1.816 m)  General: Well developed black male in no acute distress Head: Normocephalic and atraumatic Eyes:  Sclerae anicteric, conjunctiva pink  Ears: Normal auditory acuity Lungs: Clear throughout to auscultation Heart: Regular rate and rhythm Abdomen: Soft, non-distended.  Normal bowel sounds.  Non-tender. Musculoskeletal:  Left BKA. Extremities: Left BKA. Neurological: Alert oriented x 4, grossly non-focal Psychological:  Alert and cooperative. Normal mood and affect  Assessment and Recommendations: -IDA:  Has been ongoing for quite some time.  Was recently transfused with 2 units PRBC's.  Last Hgb one week ago was stable at 8.5 grams.  Is now on ferrous sulfate 325 mg TID as well.  No sign of overt GI bleeding.  Is already scheduled for EGD with Dr. Loletha Carrow at [facility] on 01/23/2016, which he will proceed with.  Will continue iron supplements.  Will request that he have a repeat CBC next week and it should be monitored regularly at living facility [facility]).

## 2015-12-13 ENCOUNTER — Encounter: Payer: Self-pay | Admitting: Physical Medicine & Rehabilitation

## 2015-12-13 ENCOUNTER — Other Ambulatory Visit: Payer: Self-pay | Admitting: *Deleted

## 2015-12-13 ENCOUNTER — Encounter: Payer: Medicaid Other | Attending: Physical Medicine & Rehabilitation | Admitting: Physical Medicine & Rehabilitation

## 2015-12-13 VITALS — BP 134/74 | HR 91 | Resp 14

## 2015-12-13 DIAGNOSIS — E1140 Type 2 diabetes mellitus with diabetic neuropathy, unspecified: Secondary | ICD-10-CM | POA: Insufficient documentation

## 2015-12-13 DIAGNOSIS — B1920 Unspecified viral hepatitis C without hepatic coma: Secondary | ICD-10-CM | POA: Insufficient documentation

## 2015-12-13 DIAGNOSIS — N189 Chronic kidney disease, unspecified: Secondary | ICD-10-CM | POA: Insufficient documentation

## 2015-12-13 DIAGNOSIS — Z89512 Acquired absence of left leg below knee: Secondary | ICD-10-CM | POA: Diagnosis not present

## 2015-12-13 DIAGNOSIS — I129 Hypertensive chronic kidney disease with stage 1 through stage 4 chronic kidney disease, or unspecified chronic kidney disease: Secondary | ICD-10-CM | POA: Insufficient documentation

## 2015-12-13 DIAGNOSIS — Z5189 Encounter for other specified aftercare: Secondary | ICD-10-CM | POA: Insufficient documentation

## 2015-12-13 DIAGNOSIS — G546 Phantom limb syndrome with pain: Secondary | ICD-10-CM

## 2015-12-13 DIAGNOSIS — E1122 Type 2 diabetes mellitus with diabetic chronic kidney disease: Secondary | ICD-10-CM | POA: Diagnosis not present

## 2015-12-13 DIAGNOSIS — S88112A Complete traumatic amputation at level between knee and ankle, left lower leg, initial encounter: Secondary | ICD-10-CM

## 2015-12-13 DIAGNOSIS — R52 Pain, unspecified: Secondary | ICD-10-CM | POA: Diagnosis present

## 2015-12-13 DIAGNOSIS — Z87891 Personal history of nicotine dependence: Secondary | ICD-10-CM | POA: Insufficient documentation

## 2015-12-13 LAB — CBC AND DIFFERENTIAL
HCT: 28 % — AB (ref 41–53)
Hemoglobin: 8.4 g/dL — AB (ref 13.5–17.5)

## 2015-12-13 MED ORDER — OXYCODONE HCL 5 MG PO TABS: ORAL_TABLET | ORAL | 0 refills | Status: DC

## 2015-12-13 NOTE — Progress Notes (Signed)
Subjective:    Patient ID: Cory Weber, male    DOB: [birth year], 65 y.o.   MRN: [id number]  HPI  Cory Weber is back regarding his left BKA. His left leg is improving. He has worn the prosthesis at times but has been limited by recent medical problems. He was admitted recently for symptomatic anemia necessitating transfusion 2 units PRBC's. He apparently will have an EGD on 01/23/16.   His phantom pains are better on the gabapentin. Most of his pain is coming from his incision site as well as low back.   Pain Inventory Average Pain 8 Pain Right Now 8 My pain is intermittent and stabbing  In the last 24 hours, has pain interfered with the following? General activity 9 Relation with others 0 Enjoyment of life 8 What TIME of day is your pain at its worst? morning, evening, night Sleep (in general) Poor  Pain is worse with: sitting Pain improves with: therapy/exercise Relief from Meds: no selection  Mobility use a wheelchair  Function I need assistance with the following:  meal prep, household duties and shopping  Neuro/Psych numbness  Prior Studies Any changes since last visit?  no  Physicians involved in your care Any changes since last visit?  no   Family History  Problem Relation Age of Onset  . Breast cancer Mother   . Heart failure Mother   . Diabetes Father   . Hypertension Father   . Heart disease Father   . Hyperlipidemia Father   . Diabetes Sister   . Hypertension Sister   . Breast cancer Sister   . Colon cancer Neg Hx   . Liver disease Neg Hx    Social History   Social History  . Marital status: Divorced    Spouse name: N/A  . Number of children: 1  . Years of education: N/A   Occupational History  . trying to get disability    Social History Main Topics  . Smoking status: Former Smoker    Packs/day: 1.00    Years: 47.00    Types: Cigarettes    Quit date: 07/13/2015  . Smokeless tobacco: Never Used  . Alcohol use 0.0 oz/week   Comment: 08/15/2015 "stopped drinking 3-4 years ago"  . Drug use:     Types: Cocaine     Comment: 08/15/2015 "last used cocaine 3-4 months ago"  . Sexual activity: No   Other Topics Concern  . None   Social History Narrative  . None   Past Surgical History:  Procedure Laterality Date  . AMPUTATION Left 08/16/2015   Procedure: LEFT BELOW THE KNEE AMPUTATION ;  Surgeon: Serafina Mitchell, MD;  Location: [facility] [facility];  Service: Vascular;  Laterality: Left;  . BACK SURGERY    . BIOPSY N/A 09/03/2012   Procedure: BIOPSY;  Surgeon: Daneil Dolin, MD;  Location: [facility];  Service: Endoscopy;  Laterality: N/A;  gastric and gastric mucosa  . BIOPSY N/A 12/03/2012   Procedure: BIOPSY;  Surgeon: Daneil Dolin, MD;  Location: [facility];  Service: Endoscopy;  Laterality: N/A;  . COLONOSCOPY WITH PROPOFOL N/A 09/03/2012   HDQ:QIWLNLG polyp-removed as outlined above. Prominent internal hemorrhoids. Tubular adenoma  . ESOPHAGOGASTRODUODENOSCOPY (EGD) WITH PROPOFOL N/A 09/03/2012   XQJ:JHERDE hernia. Gastric diverticulum. Gastric ulcers with associated erosions. Duodenal erosions. Status post gastric biopsy. H.PYLORI gastritis   . ESOPHAGOGASTRODUODENOSCOPY (EGD) WITH PROPOFOL N/A 12/03/2012   Dr. Gala Romney: gastric diverticulum, gastric erosions and scar. Previously noted gastric ulcer completed healed. Biopsy  without H.pylori.   Marland Kitchen LIVER BIOPSY  2005   Done in [place], [place]. Chronic hepatitis with mild periportal inflammation, lobular unicellular necrosis and portal fibrosis. Grade 2, stage 1-2.  Marland Kitchen MAXIMUM ACCESS (MAS)POSTERIOR LUMBAR INTERBODY FUSION (PLIF) 1 LEVEL N/A 11/25/2013   Procedure: FOR MAXIMUM ACCESS (MAS) POSTERIOR LUMBAR INTERBODY FUSION (PLIF) 1 LEVEL;  Surgeon: Eustace Moore, MD;  Location: [facility];  Service: Neurosurgery;  Laterality: N/A;  FOR MAXIMUM ACCESS (MAS) POSTERIOR LUMBAR INTERBODY FUSION (PLIF) 1 LEVEL LUMBAR 3-4  . PERIPHERAL VASCULAR CATHETERIZATION Left 08/01/2015   Procedure: Lower  Extremity Angiography;  Surgeon: Serafina Mitchell, MD;  Location: [place] [facility];  Service: Cardiovascular;  Laterality: Left;  . PERIPHERAL VASCULAR CATHETERIZATION N/A 08/01/2015   Procedure: Abdominal Aortogram;  Surgeon: Serafina Mitchell, MD;  Location: [place] [facility];  Service: Cardiovascular;  Laterality: N/A;  . PERIPHERAL VASCULAR CATHETERIZATION N/A 08/08/2015   Procedure: Abdominal Aortogram w/Lower Extremity;  Surgeon: Serafina Mitchell, MD;  Location: [place] [facility];  Service: Cardiovascular;  Laterality: N/A;  . PERIPHERAL VASCULAR CATHETERIZATION Left 08/08/2015   Procedure: Peripheral Vascular Balloon Angioplasty;  Surgeon: Serafina Mitchell, MD;  Location: [place] [facility];  Service: Cardiovascular;  Laterality: Left;  left popiteal artery, left peronealtrunk, left post tibial  . POLYPECTOMY N/A 09/03/2012   Procedure: POLYPECTOMY;  Surgeon: Daneil Dolin, MD;  Location: [facility];  Service: Endoscopy;  Laterality: N/A;  cecal polyp  . TONSILLECTOMY     Past Medical History:  Diagnosis Date  . Anemia   . Asthma   . Cellulitis and abscess of foot 07/2015  . CVA (cerebral vascular accident) ([place]) 2015   denies residual on 08/15/2015  . Hepatitis C    states he was diagnosed in 2007 or 2007 while living in [place], [place]  . Hyperlipidemia   . Hypertension   . Peripheral neuropathy ([place])   . Pneumonia X 1  . PVD (peripheral vascular disease) ([place])   . Type II diabetes mellitus (HCC)    BP 134/74 (BP Location: Left Arm, Patient Position: Sitting, Cuff Size: Large)   Pulse 91   Resp 14   SpO2 95%   Opioid Risk Score:   Fall Risk Score:  `1  Depression screen PHQ 2/9  Depression screen PHQ 2/9 02/22/2015  Decreased Interest 0  Down, Depressed, Hopeless 0  PHQ - 2 Score 0    Review of Systems  Constitutional: Negative.   HENT: Negative.   Eyes: Negative.   Respiratory: Negative.   Cardiovascular: Negative.   Gastrointestinal: Negative.   Endocrine: Negative.     Genitourinary: Negative.   Musculoskeletal: Positive for gait problem.  Skin: Negative.   Allergic/Immunologic: Negative.   Neurological: Positive for numbness.  Hematological: Negative.   Psychiatric/Behavioral: Negative.   All other systems reviewed and are negative.      Objective:   Physical Exam  Constitutional: He appears well-developed and well-nourished.  HENT: Normocephalic and atraumatic.  Cardiovascular: Normal rate and regular rhythm. Respiratory: Effort normal and breath sounds normal. No respiratory distress.  GI: Soft. Bowel sounds are normal. He exhibits no distension.  Musculoskeletal: He exhibits Mild edema, no tenderness. Stump well-shaped and formed. Tender to deep palpatoin Neurological: He is alert and oriented.  Motor: B/l UE, RLE: 5/5 proximal to distal LLE: hip flexion 5/5  Skin: Skin is warm and dry.  Amputation site still with no drainage.  Steristrips in place.  Psychiatric: He has a normal mood and  affect. His behavior is normal.     Assessment & Plan:  65 y.o. Male with history of HTN, T2DM, tobacco abuse, hepatitis C, CKD, PVD, who presents for transitional care management after receiving CIR after left BKA.    1.  S/p left BKA 08/16/2015 after recent left popliteal angioplasty.             w/c for primary mobility for now  -ideal that he would have therapy for gait training. Although I believe he's out of therapy visits  -discussed working on simple standing at home and wearing prosthesis during the day   2. Pain Management with phantom limb pain             Increase Neurontin to 600 TID              -oxycodone PRN for breakthrough pain  -encouraged more use of his prosthesis  -massage/visual feedback  3. Diabetes mellitus with peripheral neuropathy.             -per primary   4. Hypertension             Cont meds               6. Hepatitis C             Avoid hepatotoxic meds.     Follow up with me PRN. Thirty  minutes of face to face patient care time were spent during this visit. All questions were encouraged and answered.

## 2015-12-13 NOTE — Telephone Encounter (Signed)
[facility] #[phone] Fax:[phone]  

## 2015-12-13 NOTE — Patient Instructions (Signed)
TO HELP YOUR LEFT LEG PAIN TRY THE FOLLOWING  1. MASSAGE YOUR LEG DAILY 2. TRY RUBBING OBJECTS WITH DIFFERENT TEXTURES ON THE LEG 3. USE HEAT ON THE LIMB 4. WEAR YOUR PROSTHESIS DAILY---STAND ON IT IF YOU CAN. 5. TRY TO DECREASE YOUR OXYCODONE USE.

## 2015-12-15 ENCOUNTER — Other Ambulatory Visit: Payer: Self-pay | Admitting: General Surgery

## 2015-12-20 ENCOUNTER — Encounter: Payer: Self-pay | Admitting: Adult Health

## 2015-12-20 ENCOUNTER — Non-Acute Institutional Stay (SKILLED_NURSING_FACILITY): Payer: Medicaid Other | Admitting: Adult Health

## 2015-12-20 DIAGNOSIS — R635 Abnormal weight gain: Secondary | ICD-10-CM

## 2015-12-20 DIAGNOSIS — N183 Chronic kidney disease, stage 3 unspecified: Secondary | ICD-10-CM

## 2015-12-20 DIAGNOSIS — R600 Localized edema: Secondary | ICD-10-CM | POA: Diagnosis not present

## 2015-12-20 NOTE — Progress Notes (Signed)
Patient ID: Cory Weber, male   DOB: 25-Jul-1950, 65 y.o.   MRN: [id number]   Location:   [facility] Room Number: 223-B Place of Service:  SNF (31)   CODE STATUS: Full Code  No Known Allergies  Chief Complaint  Patient presents with  . Acute Visit    Weight gain    HPI:  He has gained weight from 11-22-15 at 191.6 pounds to 12-20-15: 221 pounds a gain of 36.6 pounds. He denies any chest pain shortness of breath. Is able to lay flat in bed. He does tell that he feels as though he has gained weight.    Past Medical History:  Diagnosis Date  . Anemia   . Asthma   . Cellulitis and abscess of foot 07/2015  . CVA (cerebral vascular accident) ([place]) 2015   denies residual on 08/15/2015  . Hepatitis C    states he was diagnosed in 2007 or 2007 while living in [place], [place]  . Hyperlipidemia   . Hypertension   . Peripheral neuropathy ([place])   . Pneumonia X 1  . PVD (peripheral vascular disease) ([place])   . Type II diabetes mellitus ([place])     Past Surgical History:  Procedure Laterality Date  . AMPUTATION Left 08/16/2015   Procedure: LEFT BELOW THE KNEE AMPUTATION ;  Surgeon: Serafina Mitchell, MD;  Location: [facility];  Service: Vascular;  Laterality: Left;  . BACK SURGERY    . BIOPSY N/A 09/03/2012   Procedure: BIOPSY;  Surgeon: Daneil Dolin, MD;  Location: [facility];  Service: Endoscopy;  Laterality: N/A;  gastric and gastric mucosa  . BIOPSY N/A 12/03/2012   Procedure: BIOPSY;  Surgeon: Daneil Dolin, MD;  Location: [facility];  Service: Endoscopy;  Laterality: N/A;  . COLONOSCOPY WITH PROPOFOL N/A 09/03/2012   [id number] polyp-removed as outlined above. Prominent internal hemorrhoids. Tubular adenoma  . ESOPHAGOGASTRODUODENOSCOPY (EGD) WITH PROPOFOL N/A 09/03/2012   LGX:QJJHER hernia. Gastric diverticulum. Gastric ulcers with associated erosions. Duodenal erosions. Status post gastric biopsy. H.PYLORI gastritis   . ESOPHAGOGASTRODUODENOSCOPY (EGD) WITH PROPOFOL N/A  12/03/2012   Dr. Gala Romney: gastric diverticulum, gastric erosions and scar. Previously noted gastric ulcer completed healed. Biopsy without H.pylori.   Marland Kitchen LIVER BIOPSY  2005   Done in [place], [place]. Chronic hepatitis with mild periportal inflammation, lobular unicellular necrosis and portal fibrosis. Grade 2, stage 1-2.  Marland Kitchen MAXIMUM ACCESS (MAS)POSTERIOR LUMBAR INTERBODY FUSION (PLIF) 1 LEVEL N/A 11/25/2013   Procedure: FOR MAXIMUM ACCESS (MAS) POSTERIOR LUMBAR INTERBODY FUSION (PLIF) 1 LEVEL;  Surgeon: Eustace Moore, MD;  Location: [facility];  Service: Neurosurgery;  Laterality: N/A;  FOR MAXIMUM ACCESS (MAS) POSTERIOR LUMBAR INTERBODY FUSION (PLIF) 1 LEVEL LUMBAR 3-4  . PERIPHERAL VASCULAR CATHETERIZATION Left 08/01/2015   Procedure: Lower Extremity Angiography;  Surgeon: Serafina Mitchell, MD;  Location: [facility];  Service: Cardiovascular;  Laterality: Left;  . PERIPHERAL VASCULAR CATHETERIZATION N/A 08/01/2015   Procedure: Abdominal Aortogram;  Surgeon: Serafina Mitchell, MD;  Location: [facility];  Service: Cardiovascular;  Laterality: N/A;  . PERIPHERAL VASCULAR CATHETERIZATION N/A 08/08/2015   Procedure: Abdominal Aortogram w/Lower Extremity;  Surgeon: Serafina Mitchell, MD;  Location: [facility];  Service: Cardiovascular;  Laterality: N/A;  . PERIPHERAL VASCULAR CATHETERIZATION Left 08/08/2015   Procedure: Peripheral Vascular Balloon Angioplasty;  Surgeon: Serafina Mitchell, MD;  Location: [facility];  Service: Cardiovascular;  Laterality: Left;  left popiteal artery, left peronealtrunk, left post tibial  .  POLYPECTOMY N/A 09/03/2012   Procedure: POLYPECTOMY;  Surgeon: Daneil Dolin, MD;  [facility];  Service: Endoscopy;  Laterality: N/A;  cecal polyp  . TONSILLECTOMY      Social History   Social History  . Marital status: Divorced    Spouse name: N/A  . Number of children: 1  . Years of education: N/A   Occupational History  . trying to get disability      Social History Main Topics  . Smoking status: Former Smoker    Packs/day: 1.00    Years: 47.00    Types: Cigarettes    Quit date: 07/13/2015  . Smokeless tobacco: Never Used  . Alcohol use 0.0 oz/week     Comment: 08/15/2015 "stopped drinking 3-4 years ago"  . Drug use:     Types: Cocaine     Comment: 08/15/2015 "last used cocaine 3-4 months ago"  . Sexual activity: No   Other Topics Concern  . Not on file   Social History Narrative  . No narrative on file   Family History  Problem Relation Age of Onset  . Breast cancer Mother   . Heart failure Mother   . Diabetes Father   . Hypertension Father   . Heart disease Father   . Hyperlipidemia Father   . Diabetes Sister   . Hypertension Sister   . Breast cancer Sister   . Colon cancer Neg Hx   . Liver disease Neg Hx       VITAL SIGNS BP (!) 139/58   Pulse 85   Temp 97.3 F (36.3 C) (Oral)   Resp 18   Ht 5\' 10"  (1.778 m)   Wt 191 lb 6 oz (86.8 kg)   SpO2 98%   BMI 27.46 kg/m   Patient's Medications  New Prescriptions   No medications on file  Previous Medications   ACIDOPHILUS (RISAQUAD) CAPS CAPSULE    Take 2 capsules by mouth daily.   ASPIRIN EC 81 MG TABLET    Take 1 tablet (81 mg total) by mouth daily.   CARVEDILOL (COREG) 3.125 MG TABLET    Take 1 tablet (3.125 mg total) by mouth 2 (two) times daily with a meal.   CETIRIZINE (ZYRTEC) 10 MG TABLET    Take 10 mg by mouth daily.   FAMOTIDINE (PEPCID) 20 MG TABLET    Take 1 tablet (20 mg total) by mouth daily.   FERROUS SULFATE 325 (65 FE) MG TABLET    Take 1 tablet (325 mg total) by mouth 3 (three) times daily with meals.   FOLIC ACID (FOLVITE) 1 MG TABLET    Take 1 tablet (1 mg total) by mouth daily.   GABAPENTIN (NEURONTIN) 600 MG TABLET    Take 600 mg by mouth 3 (three) times daily.   GUAIFENESIN 1200 MG TB12    Take 1 tablet by mouth every 12 (twelve) hours.   HYDROCODONE-ACETAMINOPHEN (NORCO/VICODIN) 5-325 MG TABLET    Take 1-2 tablets by mouth every 4  (four) hours as needed for moderate pain.   INSULIN ASPART (NOVOLOG) 100 UNIT/ML INJECTION    Inject 3 Units into the skin 3 (three) times daily before meals.   INSULIN GLARGINE (LANTUS) 100 UNIT/ML INJECTION    Inject 28 Units into the skin at bedtime.    METFORMIN (GLUCOPHAGE) 500 MG TABLET    Take 500 mg by mouth daily with breakfast.   MULTIPLE VITAMINS-MINERALS (DECUBI-VITE PO)    Take 1 capsule by mouth daily.  MUPIROCIN OINTMENT (BACTROBAN) 2 %    Place 1 application into the nose 2 (two) times daily.   OXYCODONE HCL, ABUSE DETER, (OXAYDO) 5 MG TABA    Take 1-2 tablets by mouth every 4 (four) hours as needed.   POLYETHYLENE GLYCOL (MIRALAX / GLYCOLAX) PACKET    Take 17 g by mouth daily.   SENNA-DOCUSATE (SENOKOT-S) 8.6-50 MG TABLET    Take 2 tablets by mouth daily as needed for mild constipation.   SIMVASTATIN (ZOCOR) 20 MG TABLET    Take 20 mg by mouth daily at 6 PM.   TAMSULOSIN (FLOMAX) 0.4 MG CAPS CAPSULE    Take 1 capsule (0.4 mg total) by mouth daily.   THIAMINE 100 MG TABLET    Take 1 tablet (100 mg total) by mouth daily.   TORSEMIDE (DEMADEX) 20 MG TABLET    Take 20 mg by mouth daily.    VITAMIN B-12 500 MCG TABLET    Take 1 tablet (500 mcg total) by mouth daily.  Modified Medications   No medications on file  Discontinued Medications   CHLORHEXIDINE GLUCONATE CLOTH 2 % PADS    Apply 6 each topically daily at 6 (six) AM.   OXYCODONE (ROXICODONE) 5 MG IMMEDIATE RELEASE TABLET    Take one tablet by mouth every 4 hours as needed for pain     SIGNIFICANT DIAGNOSTIC EXAMS  08-03-15: TEE: - Left ventricle: Septal and apical hypokinesis. The cavity size was mildly dilated. Wall thickness was increased in a pattern of moderate LVH. Systolic function was mildly reduced. The estimated ejection fraction was in the range of 45% to 50%. Wall motion was normal; there were no regional wall motion abnormalities. Left ventricular diastolic function parameters were normal. - Aortic valve:  Severely calcified non coronary cusp. - Mitral valve: Calcified annulus. Mildly thickened leaflets . - Atrial septum: No defect or patent foramen ovale was identified.  09-05-15: chest x-ray: No acute cardiopulmonary disease.  11-03-15: abdominal ultrasound: no acute abnormality  11-25-15: chest x-ray: No acute cardiopulmonary process seen.  11-26-15: ct of chest: 1. Clusters of nodules and "tree-in-bud" opacities in the right greater than left lung apices compatible with bronchiolitis. 2. Aortic valvular calcification associated with aortic stenosis. 3. Aortic atherosclerosis. 4. Moderate coronary artery calcification.      LABS REVIEWED:   08-31-15: wbc 10.7; hgb 8.7; hct 27.5; mcv 86.8; plt 453; glucose 163; bun 32; creat 1.42; k+4.1; na++132; liver normal albumin 2.6 08-26-15: wbc 9.1; hgb 8.9; hct 28.5; mcv 86.6; plt 572; glucose 140; bun 26; creat 1.18; k+ 4.4; na++132 09-01-15: wbc 8.0; hgb 8.7; hct 27.6;mcv 87.9; plt 456; glucose 128; bun 33; creat 1.26; k+ 5.5; na++ 134 09-04-15: glucose 117; bun 24; creat 1.02; k+ 4.7; na++ 134  09-18-15: glucose 166; bun 10.4; creat 0.93; k+ 4.1; na++ 140  11-25-15: wbc 8.9; hgb 6.3; hct 70.6; mcv 76.3; plt 462; glucose 397; bun 14; creat 1.06; k+ 4.1; na++ 129 11-26-15; hgb a1c 7.6; vit B 12: 287; iron 67; tibc 451; ferritin 8; ldh 149     Review of Systems  Constitutional: Negative for malaise/fatigue.  Respiratory: Negative for cough and shortness of breath.   Cardiovascular: Negative for chest pain, palpitations has right lower extremity edema  Gastrointestinal: Negative for abdominal pain, constipation and heartburn.  Musculoskeletal: Negative for back pain, joint pain and myalgias.  Skin: Negative.   Neurological: Negative for dizziness.  Psychiatric/Behavioral: The patient is not nervous/anxious.     Physical Exam  Constitutional: No  distress.  Eyes: Conjunctivae are normal.  Neck: Neck supple. No JVD present. No thyromegaly  present.  Cardiovascular: Normal rate, regular rhythm and intact distal pulses.   Respiratory: Effort normal and breath sounds normal. No respiratory distress. He has no wheezes.  GI: Soft. Bowel sounds are normal. He exhibits no distension. There is no tenderness.  Musculoskeletal: 2+ right lower extremity edema   Able to move all extremities  Status post left bka  Lymphadenopathy:    He has no cervical adenopathy.  Neurological: He is alert.  Skin: Skin is warm and dry. He is not diaphoretic.  Psychiatric: He has a normal mood and affect.     ASSESSMENT/ PLAN:  1. Weight gain 2. Right lower extremity edema 3. Reduced cardiac output with EF 45-50% (08-03-15)  4. CKD stage III  Will increase demadex to 20 mg twice daily Will repeat bmp in one week Daily weight  Will have him to be seen by Dr. Johnsie Cancel for congestive failure and will monitor his status.    Time spent with patient 40    minutes >50% time spent counseling; reviewing medical record; tests; labs; and developing future plan of care   MD is aware of resident's narcotic use and is in agreement with current plan of care. We will attempt to wean resident as appropriate.    Ok Edwards NP [facility]  Contact [phone] Monday through Friday 8am- 5pm  After hours call [phone]

## 2015-12-22 ENCOUNTER — Telehealth: Payer: Self-pay | Admitting: Cardiology

## 2015-12-22 ENCOUNTER — Encounter: Payer: Self-pay | Admitting: Cardiology

## 2015-12-22 ENCOUNTER — Ambulatory Visit (INDEPENDENT_AMBULATORY_CARE_PROVIDER_SITE_OTHER): Payer: Medicaid Other | Admitting: Cardiology

## 2015-12-22 ENCOUNTER — Encounter: Payer: Self-pay | Admitting: Surgery

## 2015-12-22 VITALS — BP 118/68 | HR 92 | Ht 72.0 in | Wt 233.0 lb

## 2015-12-22 DIAGNOSIS — N183 Chronic kidney disease, stage 3 unspecified: Secondary | ICD-10-CM

## 2015-12-22 DIAGNOSIS — R600 Localized edema: Secondary | ICD-10-CM | POA: Diagnosis not present

## 2015-12-22 DIAGNOSIS — E1122 Type 2 diabetes mellitus with diabetic chronic kidney disease: Secondary | ICD-10-CM

## 2015-12-22 DIAGNOSIS — R0602 Shortness of breath: Secondary | ICD-10-CM | POA: Diagnosis not present

## 2015-12-22 DIAGNOSIS — D649 Anemia, unspecified: Secondary | ICD-10-CM | POA: Diagnosis not present

## 2015-12-22 LAB — CBC WITH DIFFERENTIAL/PLATELET
BASOS ABS: 0 {cells}/uL (ref 0–200)
Basophils Relative: 0 %
EOS ABS: 356 {cells}/uL (ref 15–500)
Eosinophils Relative: 4 %
HEMATOCRIT: 29.3 % — AB (ref 38.5–50.0)
HEMOGLOBIN: 8.8 g/dL — AB (ref 13.2–17.1)
LYMPHS ABS: 2403 {cells}/uL (ref 850–3900)
LYMPHS PCT: 27 %
MCH: 24.4 pg — AB (ref 27.0–33.0)
MCHC: 30 g/dL — AB (ref 32.0–36.0)
MCV: 81.4 fL (ref 80.0–100.0)
MONO ABS: 890 {cells}/uL (ref 200–950)
MPV: 9.9 fL (ref 7.5–12.5)
Monocytes Relative: 10 %
NEUTROS PCT: 59 %
Neutro Abs: 5251 cells/uL (ref 1500–7800)
Platelets: 385 10*3/uL (ref 140–400)
RBC: 3.6 MIL/uL — ABNORMAL LOW (ref 4.20–5.80)
RDW: 17.5 % — AB (ref 11.0–15.0)
WBC: 8.9 10*3/uL (ref 3.8–10.8)

## 2015-12-22 LAB — BASIC METABOLIC PANEL
BUN: 15 mg/dL (ref 7–25)
CHLORIDE: 93 mmol/L — AB (ref 98–110)
CO2: 31 mmol/L (ref 20–31)
CREATININE: 1.06 mg/dL (ref 0.70–1.25)
Calcium: 9.4 mg/dL (ref 8.6–10.3)
GLUCOSE: 242 mg/dL — AB (ref 65–99)
Potassium: 4.3 mmol/L (ref 3.5–5.3)
Sodium: 133 mmol/L — ABNORMAL LOW (ref 135–146)

## 2015-12-22 NOTE — Telephone Encounter (Signed)
New message  Kim at [facility] has questions about orders  Chest xray/ekg when to do this? Schedule ekg?  Please call back

## 2015-12-22 NOTE — Progress Notes (Signed)
Cardiology Office Note   Date:  12/22/2015   ID:  Cory Weber, DOB Jul 07, 1950, MRN [id number]  PCP:  Maggie Font, MD  Cardiologist:  Dr. Jacinta Shoe     Chief Complaint  Patient presents with  . Leg Swelling  . Weight Gain      History of Present Illness: Cory Weber is a 65 y.o. male who presents for 30 lb wt gain in several weeks. He is weighed in wheel chair then wt of wheelchair is subtracted. Weight  Is up from 191 to 228, he also had lower ext edema  He is now on higher dose of torsemide.   He has a past medical history of Type 2 DM, HTN, HLD, Hepatitis C, CVA, and PVD with left fem-pop bypass 07/2015. Amputation of left lower extremity below knee.    10/2013. He had been referred at that time for an arrhythmia and heart murmur. He wore a 1-week event monitor which did not show any significant arrhythmias. An echocardiogram was performed at that time to evaluate his murmur. Echo showed an EF of 50-55% with no wall motion abnormalities, Grade 2 DD, and aortic sclerosis without stenosis.   He smokes less than 0.5 ppd, but has done so for 20+ years. Says he wants to abstain from tobacco now that he has known PVD and is seeing the consequences of cigarette use. Denies any alcohol use over the past 5+ years. Reports using Cocaine in the past (last used 2+ years ago).  Most recent echo 07/2015 with EF 45-50%, no RWMA.  Recent hospitalization with symptomatic anemia.  Transfused and low Iron.  Lives in [place].   Today no chest pain and no SOB except has to put head up at night due to SOB.  Weight here standing with clothes, shoes, fanny pack 233 lbs. He has been voiding more with increase of torsemide.  Pt states he is eating well.   Past Medical History:  Diagnosis Date  . Anemia   . Asthma   . Cellulitis and abscess of foot 07/2015  . CVA (cerebral vascular accident) ([place]) 2015   denies residual on 08/15/2015  . Hepatitis C    states he was diagnosed  in 2007 or 2007 while living in [place], [place]  . Hyperlipidemia   . Hypertension   . Peripheral neuropathy ([place])   . Pneumonia X 1  . PVD (peripheral vascular disease) ([place])   . Type II diabetes mellitus ([place])     Past Surgical History:  Procedure Laterality Date  . AMPUTATION Left 08/16/2015   Procedure: LEFT BELOW THE KNEE AMPUTATION ;  Surgeon: Serafina Mitchell, MD;  Location: [facility];  Service: Vascular;  Laterality: Left;  . BACK SURGERY    . BIOPSY N/A 09/03/2012   Procedure: BIOPSY;  Surgeon: Daneil Dolin, MD;  [facility];  Service: Endoscopy;  Laterality: N/A;  gastric and gastric mucosa  . BIOPSY N/A 12/03/2012   Procedure: BIOPSY;  Surgeon: Daneil Dolin, MD;  [facility];  Service: Endoscopy;  Laterality: N/A;  . COLONOSCOPY WITH PROPOFOL N/A 09/03/2012   HEN:IDPOEUM polyp-removed as outlined above. Prominent internal hemorrhoids. Tubular adenoma  . ESOPHAGOGASTRODUODENOSCOPY (EGD) WITH PROPOFOL N/A 09/03/2012   PNT:IRWERX hernia. Gastric diverticulum. Gastric ulcers with associated erosions. Duodenal erosions. Status post gastric biopsy. H.PYLORI gastritis   . ESOPHAGOGASTRODUODENOSCOPY (EGD) WITH PROPOFOL N/A 12/03/2012   Dr. Gala Romney: gastric diverticulum, gastric erosions and scar. Previously noted gastric ulcer completed healed.  Biopsy without H.pylori.   Marland Kitchen LIVER BIOPSY  2005   Done in [place], [place]. Chronic hepatitis with mild periportal inflammation, lobular unicellular necrosis and portal fibrosis. Grade 2, stage 1-2.  Marland Kitchen MAXIMUM ACCESS (MAS)POSTERIOR LUMBAR INTERBODY FUSION (PLIF) 1 LEVEL N/A 11/25/2013   Procedure: FOR MAXIMUM ACCESS (MAS) POSTERIOR LUMBAR INTERBODY FUSION (PLIF) 1 LEVEL;  Surgeon: Eustace Moore, MD;  Location: [facility];  Service: Neurosurgery;  Laterality: N/A;  FOR MAXIMUM ACCESS (MAS) POSTERIOR LUMBAR INTERBODY FUSION (PLIF) 1 LEVEL LUMBAR 3-4  . PERIPHERAL VASCULAR CATHETERIZATION Left 08/01/2015   Procedure: Lower Extremity Angiography;   Surgeon: Serafina Mitchell, MD;  Location: [facility];  Service: Cardiovascular;  Laterality: Left;  . PERIPHERAL VASCULAR CATHETERIZATION N/A 08/01/2015   Procedure: Abdominal Aortogram;  Surgeon: Serafina Mitchell, MD;  Location: [facility];  Service: Cardiovascular;  Laterality: N/A;  . PERIPHERAL VASCULAR CATHETERIZATION N/A 08/08/2015   Procedure: Abdominal Aortogram w/Lower Extremity;  Surgeon: Serafina Mitchell, MD;  Location: [facility];  Service: Cardiovascular;  Laterality: N/A;  . PERIPHERAL VASCULAR CATHETERIZATION Left 08/08/2015   Procedure: Peripheral Vascular Balloon Angioplasty;  Surgeon: Serafina Mitchell, MD;  Location: [facility];  Service: Cardiovascular;  Laterality: Left;  left popiteal artery, left peronealtrunk, left post tibial  . POLYPECTOMY N/A 09/03/2012   Procedure: POLYPECTOMY;  Surgeon: Daneil Dolin, MD;  [facility];  Service: Endoscopy;  Laterality: N/A;  cecal polyp  . TONSILLECTOMY       Current Outpatient Prescriptions  Medication Sig Dispense Refill  . acidophilus (RISAQUAD) CAPS capsule Take 2 capsules by mouth daily.    Marland Kitchen aspirin EC 81 MG tablet Take 1 tablet (81 mg total) by mouth daily. 100 tablet 3  . carvedilol (COREG) 3.125 MG tablet Take 1 tablet (3.125 mg total) by mouth 2 (two) times daily with a meal. 60 tablet 0  . cetirizine (ZYRTEC) 10 MG tablet Take 10 mg by mouth daily.    . famotidine (PEPCID) 20 MG tablet Take 1 tablet (20 mg total) by mouth daily. 30 tablet 0  . ferrous sulfate 325 (65 FE) MG tablet Take 1 tablet (325 mg total) by mouth 3 (three) times daily with meals. 90 tablet 3  . folic acid (FOLVITE) 1 MG tablet Take 1 tablet (1 mg total) by mouth daily. 30 tablet 0  . gabapentin (NEURONTIN) 600 MG tablet Take 600 mg by mouth 3 (three) times daily.    . Guaifenesin 1200 MG TB12 Take 1 tablet by mouth every 12 (twelve) hours.    Marland Kitchen HYDROcodone-acetaminophen (NORCO/VICODIN) 5-325 MG tablet Take 1-2 tablets  by mouth every 4 (four) hours as needed for moderate pain.    Marland Kitchen insulin aspart (NOVOLOG) 100 UNIT/ML injection Inject 3 Units into the skin 3 (three) times daily before meals.    . insulin glargine (LANTUS) 100 UNIT/ML injection Inject 28 Units into the skin at bedtime.     . metFORMIN (GLUCOPHAGE) 500 MG tablet Take 500 mg by mouth daily with breakfast.    . Multiple Vitamins-Minerals (DECUBI-VITE PO) Take 1 capsule by mouth daily.    . mupirocin ointment (BACTROBAN) 2 % Place 1 application into the nose 2 (two) times daily. 22 g 0  . OxyCODONE HCl, Abuse Deter, (OXAYDO) 5 MG TABA Take 1-2 tablets by mouth every 4 (four) hours as needed.    . polyethylene glycol (MIRALAX / GLYCOLAX) packet Take 17 g by mouth daily. 14 each 0  .  senna-docusate (SENOKOT-S) 8.6-50 MG tablet Take 2 tablets by mouth daily as needed for mild constipation. (Patient taking differently: Take 2 tablets by mouth 2 (two) times daily. ) 30 tablet 0  . simvastatin (ZOCOR) 20 MG tablet Take 20 mg by mouth daily at 6 PM.    . tamsulosin (FLOMAX) 0.4 MG CAPS capsule Take 1 capsule (0.4 mg total) by mouth daily. 30 capsule 0  . thiamine 100 MG tablet Take 1 tablet (100 mg total) by mouth daily. 30 tablet 0  . torsemide (DEMADEX) 20 MG tablet Take 20 mg by mouth daily.     . vitamin B-12 500 MCG tablet Take 1 tablet (500 mcg total) by mouth daily. 30 tablet 0   No current facility-administered medications for this visit.     Allergies:   Patient has no known allergies.    Social History:  The patient  reports that he quit smoking about 5 months ago. His smoking use included Cigarettes. He has a 47.00 pack-year smoking history. He has never used smokeless tobacco. He reports that he drinks alcohol. He reports that he uses drugs, including Cocaine.   Family History:  The patient's family history includes Breast cancer in his mother and sister; Diabetes in his father and sister; Heart disease in his father; Heart failure in his  mother; Hyperlipidemia in his father; Hypertension in his father and sister.    ROS:  General:no colds or fevers, no weight changes Skin:no rashes or ulcers HEENT:no blurred vision, no congestion CV:see HPI PUL:see HPI GI:no diarrhea constipation or melena, no indigestion GU:no hematuria, no dysuria MS:no joint pain, no claudication Neuro:no syncope, no lightheadedness Endo:+ stable diabetes, no thyroid disease  Wt 233 with standing Wt Readings from Last 3 Encounters:  12/22/15 228 lb (103.4 kg)  12/20/15 191 lb 6 oz (86.8 kg)  11/30/15 191 lb 9.6 oz (86.9 kg)    Weight is an estimate.  At 228  PHYSICAL EXAM: VS:  BP 118/68   Pulse 92   Ht 6' (1.829 m)   Wt 228 lb (103.4 kg)   BMI 30.92 kg/m  , BMI Body mass index is 30.92 kg/m. General:Pleasant affect, NAD Skin:Warm and dry, brisk capillary refill HEENT:normocephalic, sclera clear, mucus membranes moist Neck:supple, no JVD, no bruits  Heart:S1S2 RRR without murmur, gallup, rub or click Lungs:clear without rales, rhonchi, or wheezes UGQ:BVQX, non tender, + BS, do not palpate liver spleen or masses Ext:Rt  lower ext edema 1-2+ foot is the worst.  Lt BKA and no edema. 2+ radial pulses Neuro:alert and oriented X 3, MAE, follows commands, + facial symmetry    EKG:  EKG is ordered today. The ekg ordered today demonstrates SR with PACs.    Recent Labs: 08/15/2015: Magnesium 1.6; TSH 2.687 08/21/2015: ALT 15 11/29/2015: BUN 13; Creatinine 1.0; Hemoglobin 8.5; Platelets 481; Potassium 4.1; Sodium 135    Lipid Panel    Component Value Date/Time   CHOL 142 11/29/2015   TRIG 75 11/29/2015   HDL 43 11/29/2015   CHOLHDL 2.9 11/24/2013 0335   VLDL 21 11/24/2013 0335   LDLCALC 84 11/29/2015       Other studies Reviewed: Additional studies/ records that were reviewed today include: . 08/03/15 Echo Study Conclusions  - Left ventricle: Septal and apical hypokinesis. The cavity size   was mildly dilated. Wall  thickness was increased in a pattern of   moderate LVH. Systolic function was mildly reduced. The estimated   ejection fraction was in the range of  45% to 50%. Wall motion was   normal; there were no regional wall motion abnormalities. Left   ventricular diastolic function parameters were normal. - Aortic valve: Severely calcified non coronary cusp. - Mitral valve: Calcified annulus. Mildly thickened leaflets . - Atrial septum: No defect or patent foramen ovale was identified.  ASSESSMENT AND PLAN:  1.  Wt gain, though pt not severely SOB, ? Due to wt gain. From food.  He does have rt lower ext edema but not 30 lbs.  Will check CBC we see edema with anemia. Will check BNP and BMP.  If edema continues could repeat echo.  I will see back in 3 weeks, if continued edema please call.   2. PAD with gangrenous left foot. had left fem-pop bypass then Lt BKA.  EF mildly reduced (global) by Echo. No angina.    3. CKD stage 3  check BMP with increased diuretic.   3. HTN controlled  4. . HLD on statin   Current medicines are reviewed with the patient today.  The patient Has no concerns regarding medicines.  The following changes have been made:  See above Labs/ tests ordered today include:see above  Disposition:   FU:  see above  Signed, Cecilie Kicks, NP  12/22/2015 3:11 PM    [facility], [address], [place], [place] Phone: [phone]; Fax: [phone]

## 2015-12-22 NOTE — Patient Instructions (Signed)
Medication Instructions:  Your physician recommends that you continue on your current medications as directed. Please refer to the Current Medication list given to you today.   Labwork: Your physician recommends that you have lab work today: bmet/bnp/cbc   Testing/Procedures: -None  Follow-Up: Your physician recommends that you keep your scheduled  follow-up appointment in: 3 weeks with Cecilie Kicks, NP.   Any Other Special Instructions Will Be Listed Below (If Applicable).     If you need a refill on your cardiac medications before your next appointment, please call your pharmacy.

## 2015-12-22 NOTE — Telephone Encounter (Signed)
EKG was done here, no need to repeat.   CXR in next few days did not know if they could do there.   2 view for SOB.

## 2015-12-24 LAB — BRAIN NATRIURETIC PEPTIDE: Brain Natriuretic Peptide: 52.4 pg/mL

## 2015-12-25 ENCOUNTER — Ambulatory Visit: Payer: Medicaid Other | Admitting: Surgery

## 2015-12-25 ENCOUNTER — Other Ambulatory Visit: Payer: Self-pay | Admitting: *Deleted

## 2015-12-25 MED ORDER — HYDROCODONE-ACETAMINOPHEN 5-325 MG PO TABS: 1.0000 | ORAL_TABLET | ORAL | 0 refills | Status: DC | PRN

## 2015-12-25 NOTE — Telephone Encounter (Signed)
[facility] #[phone] Fax:[phone]  

## 2015-12-27 ENCOUNTER — Encounter: Payer: Self-pay | Admitting: Surgery

## 2015-12-27 ENCOUNTER — Ambulatory Visit (INDEPENDENT_AMBULATORY_CARE_PROVIDER_SITE_OTHER): Payer: Medicaid Other | Admitting: Surgery

## 2015-12-27 VITALS — BP 138/86 | HR 92 | Temp 98.2°F | Resp 18 | Ht 72.0 in | Wt 228.0 lb

## 2015-12-27 DIAGNOSIS — I7025 Atherosclerosis of native arteries of other extremities with ulceration: Secondary | ICD-10-CM | POA: Diagnosis not present

## 2015-12-27 LAB — BASIC METABOLIC PANEL
BUN: 16 mg/dL (ref 4–21)
CREATININE: 1 mg/dL (ref 0.6–1.3)
GLUCOSE: 520 mg/dL
POTASSIUM: 4.6 mmol/L (ref 3.4–5.3)
SODIUM: 134 mmol/L — AB (ref 137–147)

## 2015-12-27 NOTE — Progress Notes (Signed)
[facility]  Patient name: Cory Weber MRN: [id number] DOB: [birth year] Sex: male  REASON FOR VISIT: follow up  HPI: Cory Weber is a 65 y.o. male who returns today for his first postoperative visit.  He is status post left below knee amputation.  The patient did undergo an attempt at revascularization.  Unfortunately the patient re-presented with maggots in his wound and the leg was deemed to be nonsalvageable.  He underwent left below-knee amputation on 08/17/2015.  He has recently had shoulder surgery and therefore has not been using his prosthesis.  He is mostly in a wheelchair now.  He does complain of swelling in his right leg  Past Medical History:  Diagnosis Date  . Anemia   . Asthma   . Cellulitis and abscess of foot 07/2015  . CVA (cerebral vascular accident) ([place]) 2015   denies residual on 08/15/2015  . Hepatitis C    states he was diagnosed in 2007 or 2007 while living in [place], [place]  . Hyperlipidemia   . Hypertension   . Peripheral neuropathy ([place])   . Pneumonia X 1  . PVD (peripheral vascular disease) ([place])   . Type II diabetes mellitus (HCC)     Family History  Problem Relation Age of Onset  . Breast cancer Mother   . Heart failure Mother   . Diabetes Father   . Hypertension Father   . Heart disease Father   . Hyperlipidemia Father   . Diabetes Sister   . Hypertension Sister   . Breast cancer Sister   . Colon cancer Neg Hx   . Liver disease Neg Hx     SOCIAL HISTORY: Social History  Substance Use Topics  . Smoking status: Former Smoker    Packs/day: 1.00    Years: 47.00    Types: Cigarettes    Quit date: 07/13/2015  . Smokeless tobacco: Never Used  . Alcohol use 0.0 oz/week     Comment: 08/15/2015 "stopped drinking 3-4 years ago"    No Known Allergies  Current Outpatient Prescriptions  Medication Sig Dispense Refill  . acidophilus (RISAQUAD) CAPS capsule Take 2 capsules  by mouth daily.    Marland Kitchen aspirin EC 81 MG tablet Take 1 tablet (81 mg total) by mouth daily. 100 tablet 3  . carvedilol (COREG) 3.125 MG tablet Take 1 tablet (3.125 mg total) by mouth 2 (two) times daily with a meal. 60 tablet 0  . cetirizine (ZYRTEC) 10 MG tablet Take 10 mg by mouth daily.    . famotidine (PEPCID) 20 MG tablet Take 1 tablet (20 mg total) by mouth daily. 30 tablet 0  . ferrous sulfate 325 (65 FE) MG tablet Take 1 tablet (325 mg total) by mouth 3 (three) times daily with meals. 90 tablet 3  . folic acid (FOLVITE) 1 MG tablet Take 1 tablet (1 mg total) by mouth daily. 30 tablet 0  . gabapentin (NEURONTIN) 600 MG tablet Take 600 mg by mouth 3 (three) times daily.    . Guaifenesin 1200 MG TB12 Take 1 tablet by mouth every 12 (twelve) hours.    Marland Kitchen HYDROcodone-acetaminophen (NORCO/VICODIN) 5-325 MG tablet Take 1-2 tablets by mouth every 4 (four) hours as needed for moderate pain. 360 tablet 0  . insulin aspart (NOVOLOG) 100 UNIT/ML injection Inject 3 Units into the skin 3 (three) times daily before meals.    . insulin glargine (LANTUS) 100 UNIT/ML injection Inject 28 Units into the skin at bedtime.     Marland Kitchen  metFORMIN (GLUCOPHAGE) 500 MG tablet Take 500 mg by mouth daily with breakfast.    . Multiple Vitamins-Minerals (DECUBI-VITE PO) Take 1 capsule by mouth daily.    . mupirocin ointment (BACTROBAN) 2 % Place 1 application into the nose 2 (two) times daily. 22 g 0  . OxyCODONE HCl, Abuse Deter, (OXAYDO) 5 MG TABA Take 1-2 tablets by mouth every 4 (four) hours as needed.    . polyethylene glycol (MIRALAX / GLYCOLAX) packet Take 17 g by mouth daily. 14 each 0  . senna-docusate (SENOKOT-S) 8.6-50 MG tablet Take 2 tablets by mouth daily as needed for mild constipation. (Patient taking differently: Take 2 tablets by mouth 2 (two) times daily. ) 30 tablet 0  . simvastatin (ZOCOR) 20 MG tablet Take 20 mg by mouth daily at 6 PM.    . tamsulosin (FLOMAX) 0.4 MG CAPS capsule Take 1 capsule (0.4 mg total)  by mouth daily. 30 capsule 0  . thiamine 100 MG tablet Take 1 tablet (100 mg total) by mouth daily. 30 tablet 0  . torsemide (DEMADEX) 20 MG tablet Take 20 mg by mouth 2 (two) times daily.    . vitamin B-12 500 MCG tablet Take 1 tablet (500 mcg total) by mouth daily. 30 tablet 0   No current facility-administered medications for this visit.     REVIEW OF SYSTEMS:  [X]  denotes positive finding, [ ]  denotes negative finding Cardiac  Comments:  Chest pain or chest pressure:    Shortness of breath upon exertion:    Short of breath when lying flat:    Irregular heart rhythm:        Vascular    Pain in calf, thigh, or hip brought on by ambulation:    Pain in feet at night that wakes you up from your sleep:     Blood clot in your veins:    Leg swelling:  x       Pulmonary    Oxygen at home:    Productive cough:     Wheezing:         Neurologic    Sudden weakness in arms or legs:     Sudden numbness in arms or legs:     Sudden onset of difficulty speaking or slurred speech:    Temporary loss of vision in one eye:     Problems with dizziness:         Gastrointestinal    Blood in stool:     Vomited blood:         Genitourinary    Burning when urinating:     Blood in urine:        Psychiatric    Major depression:         Hematologic    Bleeding problems:    Problems with blood clotting too easily:        Skin    Rashes or ulcers:        Constitutional    Fever or chills:      PHYSICAL EXAM: Vitals:   12/27/15 1147  BP: 138/86  Pulse: 92  Resp: 18  Temp: 98.2 F (36.8 C)  TempSrc: Oral  SpO2: 97%  Weight: 228 lb (103.4 kg)  Height: 6' (1.829 m)    GENERAL: The patient is a well-nourished male, in no acute distress. The vital signs are documented above. CARDIAC: There is a regular rate and rhythm.  VASCULAR: The left below knee amputation stump has healed nicely. PULMONARY: There is  good air exchange  MUSCULOSKELETAL: Healed left BKA NEUROLOGIC: No focal  weakness or paresthesias are detected. SKIN: There are no ulcers or rashes noted. PSYCHIATRIC: The patient has a normal affect.  DATA:  None  MEDICAL ISSUES: Status post left below-knee amputation: The patient should resume physical therapy to help facilitate using his prosthesis, once he has been cleared for weightbearing with his right shoulder, having just undergone right shoulder surgery Right leg swelling: Patient was encouraged to keep his leg elevated.  I've also giving him information about compression stockings.  He will contact me if he has any further questions    Annamarie Major, MD [facility] [phone] Pager [phone]

## 2015-12-29 ENCOUNTER — Encounter: Payer: Self-pay | Admitting: Adult Health

## 2015-12-29 ENCOUNTER — Non-Acute Institutional Stay (SKILLED_NURSING_FACILITY): Payer: Medicaid Other | Admitting: Adult Health

## 2015-12-29 DIAGNOSIS — B182 Chronic viral hepatitis C: Secondary | ICD-10-CM

## 2015-12-29 DIAGNOSIS — D500 Iron deficiency anemia secondary to blood loss (chronic): Secondary | ICD-10-CM | POA: Diagnosis not present

## 2015-12-29 DIAGNOSIS — E1151 Type 2 diabetes mellitus with diabetic peripheral angiopathy without gangrene: Secondary | ICD-10-CM | POA: Diagnosis not present

## 2015-12-29 DIAGNOSIS — I70244 Atherosclerosis of native arteries of left leg with ulceration of heel and midfoot: Secondary | ICD-10-CM

## 2015-12-29 DIAGNOSIS — N183 Chronic kidney disease, stage 3 unspecified: Secondary | ICD-10-CM

## 2015-12-29 DIAGNOSIS — E1169 Type 2 diabetes mellitus with other specified complication: Secondary | ICD-10-CM

## 2015-12-29 DIAGNOSIS — E785 Hyperlipidemia, unspecified: Secondary | ICD-10-CM

## 2015-12-29 NOTE — Progress Notes (Signed)
Patient ID: Cory Weber, male   DOB: 1950/07/04, 65 y.o.   MRN: [id number]   Location:   [facility] Room Number: 223-B Place of Service:  SNF (31)   CODE STATUS: Full Code  No Known Allergies  Chief Complaint  Patient presents with  . Medical Management of Chronic Issues    Follow up    HPI:  He is a long term resident of this facility being seen for the management of his chronic illnesses.  His cbg'a remain elevated. He is not voicing any concerns at this time. There are no nursing concerns at this time.    Past Medical History:  Diagnosis Date  . Anemia   . Asthma   . Cellulitis and abscess of foot 07/2015  . CVA (cerebral vascular accident) ([place]) 2015   denies residual on 08/15/2015  . Hepatitis C    states he was diagnosed in 2007 or 2007 while living in [place], [place]  . Hyperlipidemia   . Hypertension   . Peripheral neuropathy ([place])   . Pneumonia X 1  . PVD (peripheral vascular disease) ([place])   . Type II diabetes mellitus ([place])     Past Surgical History:  Procedure Laterality Date  . AMPUTATION Left 08/16/2015   Procedure: LEFT BELOW THE KNEE AMPUTATION ;  Surgeon: Serafina [facility], MD;  Location: [facility];  Service: Vascular;  Laterality: Left;  . BACK SURGERY    . BIOPSY N/A 09/03/2012   Procedure: BIOPSY;  Surgeon: Daneil Dolin, MD;  Location: [facility];  Service: Endoscopy;  Laterality: N/A;  gastric and gastric mucosa  . BIOPSY N/A 12/03/2012   Procedure: BIOPSY;  Surgeon: Daneil Dolin, MD;  Location: [facility];  Service: Endoscopy;  Laterality: N/A;  . COLONOSCOPY WITH PROPOFOL N/A 09/03/2012   [id number] polyp-removed as outlined above. Prominent internal hemorrhoids. Tubular adenoma  . ESOPHAGOGASTRODUODENOSCOPY (EGD) WITH PROPOFOL N/A 09/03/2012   [id number] hernia. Gastric diverticulum. Gastric ulcers with associated erosions. Duodenal erosions. Status post gastric biopsy. H.PYLORI gastritis   . ESOPHAGOGASTRODUODENOSCOPY (EGD) WITH PROPOFOL N/A  12/03/2012   Dr. Gala Romney: gastric diverticulum, gastric erosions and scar. Previously noted gastric ulcer completed healed. Biopsy without H.pylori.   Marland Kitchen LIVER BIOPSY  2005   Done in [place], [place]. Chronic hepatitis with mild periportal inflammation, lobular unicellular necrosis and portal fibrosis. Grade 2, stage 1-2.  Marland Kitchen MAXIMUM ACCESS (MAS)POSTERIOR LUMBAR INTERBODY FUSION (PLIF) 1 LEVEL N/A 11/25/2013   Procedure: FOR MAXIMUM ACCESS (MAS) POSTERIOR LUMBAR INTERBODY FUSION (PLIF) 1 LEVEL;  Surgeon: Eustace Moore, MD;  Location: [facility];  Service: Neurosurgery;  Laterality: N/A;  FOR MAXIMUM ACCESS (MAS) POSTERIOR LUMBAR INTERBODY FUSION (PLIF) 1 LEVEL LUMBAR 3-4  . PERIPHERAL VASCULAR CATHETERIZATION Left 08/01/2015   Procedure: Lower Extremity Angiography;  Surgeon: Serafina [facility], MD;  Location: [facility];  Service: Cardiovascular;  Laterality: Left;  . PERIPHERAL VASCULAR CATHETERIZATION N/A 08/01/2015   Procedure: Abdominal Aortogram;  Surgeon: Serafina [facility], MD;  Location: [facility];  Service: Cardiovascular;  Laterality: N/A;  . PERIPHERAL VASCULAR CATHETERIZATION N/A 08/08/2015   Procedure: Abdominal Aortogram w/Lower Extremity;  Surgeon: Serafina [facility], MD;  Location: [facility];  Service: Cardiovascular;  Laterality: N/A;  . PERIPHERAL VASCULAR CATHETERIZATION Left 08/08/2015   Procedure: Peripheral Vascular Balloon Angioplasty;  Surgeon: Serafina [facility], MD;  Location: [facility];  Service: Cardiovascular;  Laterality: Left;  left popiteal artery, left peronealtrunk, left post tibial  .  POLYPECTOMY N/A 09/03/2012   Procedure: POLYPECTOMY;  Surgeon: Daneil Dolin, MD;  Location: [facility];  Service: Endoscopy;  Laterality: N/A;  cecal polyp  . TONSILLECTOMY      Social History   Social History  . Marital status: Divorced    Spouse name: N/A  . Number of children: 1  . Years of education: N/A   Occupational History  . trying to get disability      Social History Main Topics  . Smoking status: Former Smoker    Packs/day: 1.00    Years: 47.00    Types: Cigarettes    Quit date: 07/13/2015  . Smokeless tobacco: Never Used  . Alcohol use 0.0 oz/week     Comment: 08/15/2015 "stopped drinking 3-4 years ago"  . Drug use:     Types: Cocaine     Comment: 08/15/2015 "last used cocaine 3-4 months ago"  . Sexual activity: No   Other Topics Concern  . Not on file   Social History Narrative  . No narrative on file   Family History  Problem Relation Age of Onset  . Breast cancer Mother   . Heart failure Mother   . Diabetes Father   . Hypertension Father   . Heart disease Father   . Hyperlipidemia Father   . Diabetes Sister   . Hypertension Sister   . Breast cancer Sister   . Colon cancer Neg Hx   . Liver disease Neg Hx       VITAL SIGNS BP (!) 146/65   Pulse 82   Temp 97.3 F (36.3 C) (Oral)   Resp 18   Ht 5\' 10"  (1.778 m)   Wt 226 lb 9 oz (102.8 kg)   SpO2 98%   BMI 32.51 kg/m   Patient's Medications  New Prescriptions   No medications on file  Previous Medications   ACIDOPHILUS (RISAQUAD) CAPS CAPSULE    Take 2 capsules by mouth daily.   ASPIRIN EC 81 MG TABLET    Take 1 tablet (81 mg total) by mouth daily.   CARVEDILOL (COREG) 3.125 MG TABLET    Take 1 tablet (3.125 mg total) by mouth 2 (two) times daily with a meal.   CETIRIZINE (ZYRTEC) 10 MG TABLET    Take 10 mg by mouth daily.   FAMOTIDINE (PEPCID) 20 MG TABLET    Take 1 tablet (20 mg total) by mouth daily.   FERROUS SULFATE 325 (65 FE) MG TABLET    Take 1 tablet (325 mg total) by mouth 3 (three) times daily with meals.   FOLIC ACID (FOLVITE) 1 MG TABLET    Take 1 tablet (1 mg total) by mouth daily.   GABAPENTIN (NEURONTIN) 600 MG TABLET    Take 600 mg by mouth 3 (three) times daily.   GUAIFENESIN 1200 MG TB12    Take 1 tablet by mouth every 12 (twelve) hours.   HYDROCODONE-ACETAMINOPHEN (NORCO/VICODIN) 5-325 MG TABLET    Take 1-2 tablets by mouth every 4  (four) hours as needed for moderate pain.   INSULIN ASPART (NOVOLOG) 100 UNIT/ML INJECTION    Inject 3 Units into the skin 3 (three) times daily before meals.   INSULIN GLARGINE (LANTUS) 100 UNIT/ML INJECTION    Inject 28 Units into the skin at bedtime.    METFORMIN (GLUCOPHAGE) 500 MG TABLET    Take 500 mg by mouth daily with breakfast.   MULTIPLE VITAMINS-MINERALS (DECUBI-VITE PO)    Take 1 capsule by mouth daily.  MUPIROCIN OINTMENT (BACTROBAN) 2 %    Place 1 application into the nose 2 (two) times daily.   OXYCODONE HCL, ABUSE DETER, (OXAYDO) 5 MG TABA    Take 1-2 tablets by mouth every 4 (four) hours as needed.   POLYETHYLENE GLYCOL (MIRALAX / GLYCOLAX) PACKET    Take 17 g by mouth daily.   SENNA-DOCUSATE (SENOKOT-S) 8.6-50 MG TABLET    Take 2 tablets by mouth daily as needed for mild constipation.   SIMVASTATIN (ZOCOR) 20 MG TABLET    Take 20 mg by mouth daily at 6 PM.   TAMSULOSIN (FLOMAX) 0.4 MG CAPS CAPSULE    Take 1 capsule (0.4 mg total) by mouth daily.   THIAMINE 100 MG TABLET    Take 1 tablet (100 mg total) by mouth daily.   TORSEMIDE (DEMADEX) 20 MG TABLET    Take 20 mg by mouth once.    VITAMIN B-12 500 MCG TABLET    Take 1 tablet (500 mcg total) by mouth daily.  Modified Medications   No medications on file  Discontinued Medications   No medications on file     SIGNIFICANT DIAGNOSTIC EXAMS  08-03-15: TEE: - Left ventricle: Septal and apical hypokinesis. The cavity size was mildly dilated. Wall thickness was increased in a pattern of moderate LVH. Systolic function was mildly reduced. The estimated ejection fraction was in the range of 45% to 50%. Wall motion was normal; there were no regional wall motion abnormalities. Left ventricular diastolic function parameters were normal. - Aortic valve: Severely calcified non coronary cusp. - Mitral valve: Calcified annulus. Mildly thickened leaflets . - Atrial septum: No defect or patent foramen ovale was identified.  09-05-15:  chest x-ray: No acute cardiopulmonary disease.  11-03-15: abdominal ultrasound: no acute abnormality  11-25-15: chest x-ray: No acute cardiopulmonary process seen.  11-26-15: ct of chest: 1. Clusters of nodules and "tree-in-bud" opacities in the right greater than left lung apices compatible with bronchiolitis. 2. Aortic valvular calcification associated with aortic stenosis. 3. Aortic atherosclerosis. 4. Moderate coronary artery calcification.      LABS REVIEWED:   08-31-15: wbc 10.7; hgb 8.7; hct 27.5; mcv 86.8; plt 453; glucose 163; bun 32; creat 1.42; k+4.1; na++132; liver normal albumin 2.6 08-26-15: wbc 9.1; hgb 8.9; hct 28.5; mcv 86.6; plt 572; glucose 140; bun 26; creat 1.18; k+ 4.4; na++132 09-01-15: wbc 8.0; hgb 8.7; hct 27.6;mcv 87.9; plt 456; glucose 128; bun 33; creat 1.26; k+ 5.5; na++ 134 09-04-15: glucose 117; bun 24; creat 1.02; k+ 4.7; na++ 134  09-18-15: glucose 166; bun 10.4; creat 0.93; k+ 4.1; na++ 140  11-25-15: wbc 8.9; hgb 6.3; hct 70.6; mcv 76.3; plt 462; glucose 397; bun 14; creat 1.06; k+ 4.1; na++ 129 11-26-15; hgb a1c 7.6; vit B 12: 287; iron 67; tibc 451; ferritin 8; ldh 149  11-29-15: wbc 10.1; hgb 8.5; hct 28.3; mcv 76.1; plt 481; glucose 194; bun 13.1; creat 0.96; k+ 4.1; na++ 135; liver normal albumin 3.4 12-13-15; hgb 8.4; hct 27.7    Review of Systems  Constitutional: Negative for malaise/fatigue.  Respiratory: Negative for cough and shortness of breath.   Cardiovascular: Negative for chest pain, palpitations has right lower extremity edema  Gastrointestinal: Negative for abdominal pain, constipation and heartburn.  Musculoskeletal: Negative for back pain, joint pain and myalgias.  Skin: Negative.   Neurological: Negative for dizziness.  Psychiatric/Behavioral: The patient is not nervous/anxious.     Physical Exam  Constitutional: No distress.  Eyes: Conjunctivae are normal.  Neck:  Neck supple. No JVD present. No thyromegaly present.    Cardiovascular: Normal rate, regular rhythm and intact distal pulses.   Respiratory: Effort normal and breath sounds normal. No respiratory distress. He has no wheezes.  GI: Soft. Bowel sounds are normal. He exhibits no distension. There is no tenderness.  Musculoskeletal: 2+ right lower extremity edema   Able to move all extremities  Status post left bka  Lymphadenopathy:    He has no cervical adenopathy.  Neurological: He is alert.  Skin: Skin is warm and dry. He is not diaphoretic.  Psychiatric: He has a normal mood and affect.     ASSESSMENT/ PLAN:   1. Hypertension: will continue coreg 3.125 mg twice daily asa 81 mg daily    2. PUD: will continue pepcid 20 mg daily  3. Chronic hepatitis C: status post Harvoni treatment  is followed by GI: will monitor  4. Diabetes:  hgb a1c 7.6 will continue metformin 500 mg daily will increase lantus 30 units nightly will change novolog to 6 units after meals.   5. Dyslipidemia: will continue zocor 20 mg daily  6. BPH: will continue flomax daily   7. Constipation: will continue miralax daily and senna s 2 tabs twice daily   8.  PVD: is status post left bka; will continue oxycodone  5 or 10 mg  every 4 hours as needed  9. Phantom limb pain: will continue neurontin 600 mg three times daily   10. CKD stage III: bun 13.1; creat 0.96; will monitor  11.  CVA: neurologically stable will continue asa 81 mg daily   12. Anemia of chronic disease: will continue to monitor hgb 8.4   MD is aware of resident's narcotic use and is in agreement with current plan of care. We will attempt to wean resident as apropriate   Ok Edwards NP [facility]  Contact [phone] Monday through Friday 8am- 5pm  After hours call [phone]

## 2016-01-09 ENCOUNTER — Encounter: Payer: Self-pay | Admitting: Cardiology

## 2016-01-09 ENCOUNTER — Ambulatory Visit (INDEPENDENT_AMBULATORY_CARE_PROVIDER_SITE_OTHER): Payer: Medicaid Other | Admitting: Cardiology

## 2016-01-09 ENCOUNTER — Non-Acute Institutional Stay (SKILLED_NURSING_FACILITY): Payer: Medicaid Other | Admitting: Adult Health

## 2016-01-09 ENCOUNTER — Encounter: Payer: Self-pay | Admitting: Adult Health

## 2016-01-09 VITALS — BP 124/64 | HR 93 | Temp 96.0°F | Ht 72.0 in | Wt 238.0 lb

## 2016-01-09 DIAGNOSIS — E1122 Type 2 diabetes mellitus with diabetic chronic kidney disease: Secondary | ICD-10-CM

## 2016-01-09 DIAGNOSIS — IMO0002 Reserved for concepts with insufficient information to code with codable children: Secondary | ICD-10-CM

## 2016-01-09 DIAGNOSIS — Z01818 Encounter for other preprocedural examination: Secondary | ICD-10-CM | POA: Diagnosis not present

## 2016-01-09 DIAGNOSIS — I2510 Atherosclerotic heart disease of native coronary artery without angina pectoris: Secondary | ICD-10-CM

## 2016-01-09 DIAGNOSIS — R600 Localized edema: Secondary | ICD-10-CM | POA: Diagnosis not present

## 2016-01-09 DIAGNOSIS — E1143 Type 2 diabetes mellitus with diabetic autonomic (poly)neuropathy: Secondary | ICD-10-CM | POA: Diagnosis not present

## 2016-01-09 DIAGNOSIS — E785 Hyperlipidemia, unspecified: Secondary | ICD-10-CM | POA: Diagnosis not present

## 2016-01-09 DIAGNOSIS — E1169 Type 2 diabetes mellitus with other specified complication: Secondary | ICD-10-CM | POA: Diagnosis not present

## 2016-01-09 DIAGNOSIS — I10 Essential (primary) hypertension: Secondary | ICD-10-CM

## 2016-01-09 DIAGNOSIS — D649 Anemia, unspecified: Secondary | ICD-10-CM

## 2016-01-09 DIAGNOSIS — N183 Chronic kidney disease, stage 3 (moderate): Secondary | ICD-10-CM

## 2016-01-09 DIAGNOSIS — E1165 Type 2 diabetes mellitus with hyperglycemia: Secondary | ICD-10-CM | POA: Diagnosis not present

## 2016-01-09 NOTE — Progress Notes (Signed)
Cardiology Office Note   Date:  01/09/2016   ID:  Cory Weber, DOB 06-28-1950, MRN [id number]  PCP:  Maggie Font, MD  Cardiologist:  Dr. Jacinta Shoe     Chief Complaint  Patient presents with  . Leg Swelling      History of Present Illness: Cory Weber is a 65 y.o. male who presents for follow up for volume overload due to combined systolic and diastolic dysfunction.   He has a past medical history of Type 2 DM, HTN, HLD, Hepatitis C, CVA, and PVD with left fem-pop bypass 07/2015. Amputation of left lower extremity below knee.    10/2013. He had been referred at that time for an arrhythmia and heart murmur. He wore a 1-week event monitor which did not show any significant arrhythmias. An echocardiogram was performed at that time to evaluate his murmur. Echo showed an EF of 50-55% with no wall motion abnormalities, Grade 2 DD, and aortic sclerosis without stenosis.   He smokes less than 0.5 ppd, but has done so for 20+ years. Says he wants to abstain from tobacco now that he has known PVD and is seeing the consequences of cigarette use. Denies any alcohol use over the past 5+ years. Reports using Cocaine in the past (last used 2+ years ago).  Most recent echo 07/2015 with EF 45-50%, no RWMA.  Difficult to weigh due to amputation.  BNP was 52.4.  hgb stable at 8.8, glucose was 520, but Cr 1.0.    Today no chest pain and no SOB.  + lower ext edema on Rt.  Also c/o's of constipation today.  CT of chest was done revealing coronary calcification and aortic valve calcification.  Pt is for EGD 01/23/16 - the calcification noted may be due to aging but will do lexiscan myoview to rule out ischemia.    Pt with edema and continues to use salt in diet.  His glucose is not well controlled as well. We discussed importance of both.  Past Medical History:  Diagnosis Date  . Anemia   . Asthma   . Cellulitis and abscess of foot 07/2015  . CVA (cerebral vascular accident) ([place]) 2015   denies residual on 08/15/2015  . Hepatitis C    states he was diagnosed in 2007 or 2007 while living in [place], [place]  . Hyperlipidemia   . Hypertension   . Peripheral neuropathy ([place])   . Pneumonia X 1  . PVD (peripheral vascular disease) ([place])   . Type II diabetes mellitus ([place])     Past Surgical History:  Procedure Laterality Date  . AMPUTATION Left 08/16/2015   Procedure: LEFT BELOW THE KNEE AMPUTATION ;  Surgeon: Serafina Mitchell, MD;  Location: [facility];  Service: Vascular;  Laterality: Left;  . BACK SURGERY    . BIOPSY N/A 09/03/2012   Procedure: BIOPSY;  Surgeon: Daneil Dolin, MD;  [facility]: [facility];  Service: Endoscopy;  Laterality: N/A;  gastric and gastric mucosa  . BIOPSY N/A 12/03/2012   Procedure: BIOPSY;  Surgeon: Daneil Dolin, MD;  Location: [facility];  Service: Endoscopy;  Laterality: N/A;  . COLONOSCOPY WITH PROPOFOL N/A 09/03/2012   IPJ:ASNKNLZ polyp-removed as outlined above. Prominent internal hemorrhoids. Tubular adenoma  . ESOPHAGOGASTRODUODENOSCOPY (EGD) WITH PROPOFOL N/A 09/03/2012   JQB:HALPFX hernia. Gastric diverticulum. Gastric ulcers with associated erosions. Duodenal erosions. Status post gastric biopsy. H.PYLORI gastritis   . ESOPHAGOGASTRODUODENOSCOPY (EGD) WITH PROPOFOL N/A 12/03/2012   Dr. Gala Romney: gastric diverticulum, gastric erosions  and scar. Previously noted gastric ulcer completed healed. Biopsy without H.pylori.   Marland Kitchen LIVER BIOPSY  2005   Done in [place], [place]. Chronic hepatitis with mild periportal inflammation, lobular unicellular necrosis and portal fibrosis. Grade 2, stage 1-2.  Marland Kitchen MAXIMUM ACCESS (MAS)POSTERIOR LUMBAR INTERBODY FUSION (PLIF) 1 LEVEL N/A 11/25/2013   Procedure: FOR MAXIMUM ACCESS (MAS) POSTERIOR LUMBAR INTERBODY FUSION (PLIF) 1 LEVEL;  Surgeon: Eustace Moore, MD;  Location: [facility];  Service: Neurosurgery;  Laterality: N/A;  FOR MAXIMUM ACCESS (MAS) POSTERIOR LUMBAR INTERBODY FUSION (PLIF) 1 LEVEL LUMBAR 3-4  . PERIPHERAL VASCULAR  CATHETERIZATION Left 08/01/2015   Procedure: Lower Extremity Angiography;  Surgeon: Serafina Mitchell, MD;  Location: [facility];  Service: Cardiovascular;  Laterality: Left;  . PERIPHERAL VASCULAR CATHETERIZATION N/A 08/01/2015   Procedure: Abdominal Aortogram;  Surgeon: Serafina Mitchell, MD;  Location: [facility];  Service: Cardiovascular;  Laterality: N/A;  . PERIPHERAL VASCULAR CATHETERIZATION N/A 08/08/2015   Procedure: Abdominal Aortogram w/Lower Extremity;  Surgeon: Serafina Mitchell, MD;  Location: [facility];  Service: Cardiovascular;  Laterality: N/A;  . PERIPHERAL VASCULAR CATHETERIZATION Left 08/08/2015   Procedure: Peripheral Vascular Balloon Angioplasty;  Surgeon: Serafina Mitchell, MD;  Location: [facility];  Service: Cardiovascular;  Laterality: Left;  left popiteal artery, left peronealtrunk, left post tibial  . POLYPECTOMY N/A 09/03/2012   Procedure: POLYPECTOMY;  Surgeon: Daneil Dolin, MD;  [facility];  Service: Endoscopy;  Laterality: N/A;  cecal polyp  . TONSILLECTOMY       Current Outpatient Prescriptions  Medication Sig Dispense Refill  . acidophilus (RISAQUAD) CAPS capsule Take 2 capsules by mouth daily.    Marland Kitchen aspirin EC 81 MG tablet Take 1 tablet (81 mg total) by mouth daily. 100 tablet 3  . carvedilol (COREG) 3.125 MG tablet Take 1 tablet (3.125 mg total) by mouth 2 (two) times daily with a meal. 60 tablet 0  . cetirizine (ZYRTEC) 10 MG tablet Take 10 mg by mouth daily.    . famotidine (PEPCID) 20 MG tablet Take 1 tablet (20 mg total) by mouth daily. 30 tablet 0  . ferrous sulfate 325 (65 FE) MG tablet Take 1 tablet (325 mg total) by mouth 3 (three) times daily with meals. 90 tablet 3  . folic acid (FOLVITE) 1 MG tablet Take 1 tablet (1 mg total) by mouth daily. 30 tablet 0  . gabapentin (NEURONTIN) 600 MG tablet Take 600 mg by mouth 3 (three) times daily.    . Guaifenesin 1200 MG TB12 Take 1 tablet by mouth every 12 (twelve) hours.    Marland Kitchen  HYDROcodone-acetaminophen (NORCO/VICODIN) 5-325 MG tablet Take 1-2 tablets by mouth every 4 (four) hours as needed for moderate pain. 360 tablet 0  . insulin aspart (NOVOLOG) 100 UNIT/ML injection Inject 6 Units into the skin 3 (three) times daily before meals.     . insulin glargine (LANTUS) 100 UNIT/ML injection Inject 30 Units into the skin at bedtime.     . metFORMIN (GLUCOPHAGE) 500 MG tablet Take 500 mg by mouth daily with breakfast.    . Multiple Vitamins-Minerals (DECUBI-VITE PO) Take 1 capsule by mouth daily.    . mupirocin ointment (BACTROBAN) 2 % Place 1 application into the nose 2 (two) times daily. 22 g 0  . OxyCODONE HCl, Abuse Deter, (OXAYDO) 5 MG TABA Take 1-2 tablets by mouth every 4 (four) hours as needed (PAIN).     Marland Kitchen polyethylene glycol (MIRALAX /  GLYCOLAX) packet Take 17 g by mouth daily. 14 each 0  . sennosides-docusate sodium (SENOKOT-S) 8.6-50 MG tablet Take 2 tablets by mouth as needed for constipation.     . simvastatin (ZOCOR) 20 MG tablet Take 20 mg by mouth daily at 6 PM.    . tamsulosin (FLOMAX) 0.4 MG CAPS capsule Take 1 capsule (0.4 mg total) by mouth daily. 30 capsule 0  . thiamine 100 MG tablet Take 1 tablet (100 mg total) by mouth daily. 30 tablet 0  . torsemide (DEMADEX) 20 MG tablet Take 20 mg by mouth once.     . vitamin B-12 500 MCG tablet Take 1 tablet (500 mcg total) by mouth daily. 30 tablet 0   No current facility-administered medications for this visit.     Allergies:   Patient has no known allergies.    Social History:  The patient  reports that he quit smoking about 5 months ago. His smoking use included Cigarettes. He has a 47.00 pack-year smoking history. He has never used smokeless tobacco. He reports that he drinks alcohol. He reports that he uses drugs, including Cocaine.   Family History:  The patient's family history includes Breast cancer in his mother and sister; Diabetes in his father and sister; Heart disease in his father; Heart failure  in his mother; Hyperlipidemia in his father; Hypertension in his father and sister.    ROS:  General:no colds or fevers, continued weight gain though this wt is from SNF Skin:no rashes or ulcers HEENT:no blurred vision, no congestion CV:see HPI PUL:see HPI GI:no diarrhea +constipation or melena, no indigestion GU:no hematuria, no dysuria MS:no joint pain, no claudication Neuro:no syncope, no lightheadedness Endo:+ diabetes poorly controlled, no thyroid disease  Wt Readings from Last 3 Encounters:  01/09/16 238 lb (108 kg)  01/09/16 234 lb (106.1 kg)  12/29/15 226 lb 9 oz (102.8 kg)     PHYSICAL EXAM: VS:  BP 124/64   Pulse 93   Temp (!) 96 F (35.6 C)   Ht 6' (1.829 m)   Wt 238 lb (108 kg)   BMI 32.28 kg/m  , BMI Body mass index is 32.28 kg/m. General:Pleasant affect, NAD Skin:Warm and dry, brisk capillary refill HEENT:normocephalic, sclera clear, mucus membranes moist Neck:supple, no JVD Heart:S1S2 RRR without murmur, gallup, rub or click Lungs:clear without rales, rhonchi, or wheezes WSF:KCLE, non tender, + BS, do not palpate liver spleen or masses Ext:1-2+ rt lower ext edema, and Lt BKA, 2+ radial pulses Neuro:alert and oriented X 3, MAE, follows commands, + facial symmetry    EKG:  EKG is NOT ordered today.    Recent Labs: 08/15/2015: Magnesium 1.6; TSH 2.687 08/21/2015: ALT 15 12/22/2015: Brain Natriuretic Peptide 52.4; Hemoglobin 8.8; Platelets 385 12/27/2015: BUN 16; Creatinine 1.0; Potassium 4.6; Sodium 134    Lipid Panel    Component Value Date/Time   CHOL 142 11/29/2015   TRIG 75 11/29/2015   HDL 43 11/29/2015   CHOLHDL 2.9 11/24/2013 0335   VLDL 21 11/24/2013 0335   LDLCALC 84 11/29/2015       Other studies Reviewed: Additional studies/ records that were reviewed today include:  .08/03/15 Echo Study Conclusions  - Left ventricle: Septal and apical hypokinesis. The cavity size was mildly dilated. Wall thickness was increased in a  pattern of moderate LVH. Systolic function was mildly reduced. The estimated ejection fraction was in the range of 45% to 50%. Wall motion was normal; there were no regional wall motion abnormalities. Left ventricular diastolic function  parameters were normal. - Aortic valve: Severely calcified non coronary cusp. - Mitral valve: Calcified annulus. Mildly thickened leaflets . - Atrial septum: No defect or patent foramen ovale was identified.    ASSESSMENT AND PLAN:   1.  Wt gain, though pt without SOB today, ? Due to wt gain. From food.  He does have rt lower ext edema but not 30 lbs.  CBC  With HGb of 8.8 stable and mildly improved.   BNP is 52, and BMP has been stable with elevated glucose.  Pt admits to eating salty food.  No HF today.  He is going to increase the torsemide to BID per NP at [facility].  They will follow BMP.   2. PAD with gangrenous left foot. had left fem-pop bypass then Lt BKA.  Followed by Dr. Trula Slade.  3. CKD stage 3 check BMP with increased diuretic.   3. HTN controlled  4. . HLD on statin  5. Coronary calcification on CT of chest with known PVD and diabetes.  Will plan lexiscan myoview to eval for ischemia as pt has not been very active to determine if angina.  If normal he will follow up in Jan with Dr. Jacinta Shoe in [place] his primary cardiologist, if + will check with Dr. Jacinta Shoe or Dr. Johnsie Cancel who saw him at [facility].     Current medicines are reviewed with the patient today.  The patient Has no concerns regarding medicines.  The following changes have been made:  See above Labs/ tests ordered today include:see above  Disposition:   FU:  see above  Signed, Cecilie Kicks, NP  01/09/2016 5:08 PM    [facility] [place], [place], [place] [address] [place], [place] Phone: [phone]; Fax: [phone]

## 2016-01-09 NOTE — Progress Notes (Signed)
Patient ID: Cory Weber, male   DOB: 1950/05/30, 65 y.o.   MRN: [id number]   Location:   [facility] Room Number: 223-B Place of Service:  SNF (31)   CODE STATUS: Full Code  No Known Allergies  Chief Complaint  Patient presents with  . Acute Visit    Acute    HPI:  Staff reports that he has gained weight from 229.6 pounds on 01-08-16 to 324 pounds on 01-09-16. There are no cardiac symptoms present. He was seen by cardiology today.  His cbg readings are all very elevated. There are no reports of him missing any insulin doses. He is not voicing any concerns today.    Past Medical History:  Diagnosis Date  . Anemia   . Asthma   . Cellulitis and abscess of foot 07/2015  . CVA (cerebral vascular accident) ([place]) 2015   denies residual on 08/15/2015  . Hepatitis C    states he was diagnosed in 2007 or 2007 while living in [place], [place]  . Hyperlipidemia   . Hypertension   . Peripheral neuropathy ([place])   . Pneumonia X 1  . PVD (peripheral vascular disease) ([place])   . Type II diabetes mellitus ([place])     Past Surgical History:  Procedure Laterality Date  . AMPUTATION Left 08/16/2015   Procedure: LEFT BELOW THE KNEE AMPUTATION ;  Surgeon: Serafina Mitchell, MD;  Location: [facility];  Service: Vascular;  Laterality: Left;  . BACK SURGERY    . BIOPSY N/A 09/03/2012   Procedure: BIOPSY;  Surgeon: Daneil Dolin, MD;  [facility];  Service: Endoscopy;  Laterality: N/A;  gastric and gastric mucosa  . BIOPSY N/A 12/03/2012   Procedure: BIOPSY;  Surgeon: Daneil Dolin, MD;  [facility];  Service: Endoscopy;  Laterality: N/A;  . COLONOSCOPY WITH PROPOFOL N/A 09/03/2012   YDX:AJOINOM polyp-removed as outlined above. Prominent internal hemorrhoids. Tubular adenoma  . ESOPHAGOGASTRODUODENOSCOPY (EGD) WITH PROPOFOL N/A 09/03/2012   VEH:MCNOBS hernia. Gastric diverticulum. Gastric ulcers with associated erosions. Duodenal erosions. Status post gastric biopsy. H.PYLORI  gastritis   . ESOPHAGOGASTRODUODENOSCOPY (EGD) WITH PROPOFOL N/A 12/03/2012   Dr. Gala Romney: gastric diverticulum, gastric erosions and scar. Previously noted gastric ulcer completed healed. Biopsy without H.pylori.   Marland Kitchen LIVER BIOPSY  2005   Done in [place], [place]. Chronic hepatitis with mild periportal inflammation, lobular unicellular necrosis and portal fibrosis. Grade 2, stage 1-2.  Marland Kitchen MAXIMUM ACCESS (MAS)POSTERIOR LUMBAR INTERBODY FUSION (PLIF) 1 LEVEL N/A 11/25/2013   Procedure: FOR MAXIMUM ACCESS (MAS) POSTERIOR LUMBAR INTERBODY FUSION (PLIF) 1 LEVEL;  Surgeon: Eustace Moore, MD;  Location: [facility];  Service: Neurosurgery;  Laterality: N/A;  FOR MAXIMUM ACCESS (MAS) POSTERIOR LUMBAR INTERBODY FUSION (PLIF) 1 LEVEL LUMBAR 3-4  . PERIPHERAL VASCULAR CATHETERIZATION Left 08/01/2015   Procedure: Lower Extremity Angiography;  Surgeon: Serafina Mitchell, MD;  Location: [facility];  Service: Cardiovascular;  Laterality: Left;  . PERIPHERAL VASCULAR CATHETERIZATION N/A 08/01/2015   Procedure: Abdominal Aortogram;  Surgeon: Serafina Mitchell, MD;  Location: [facility];  Service: Cardiovascular;  Laterality: N/A;  . PERIPHERAL VASCULAR CATHETERIZATION N/A 08/08/2015   Procedure: Abdominal Aortogram w/Lower Extremity;  Surgeon: Serafina Mitchell, MD;  Location: [facility] [facility];  Service: Cardiovascular;  Laterality: N/A;  . PERIPHERAL VASCULAR CATHETERIZATION Left 08/08/2015   Procedure: Peripheral Vascular Balloon Angioplasty;  Surgeon: Serafina Mitchell, MD;  Location: [facility];  Service: Cardiovascular;  Laterality: Left;  left popiteal artery, left peronealtrunk, left post tibial  . POLYPECTOMY N/A 09/03/2012   Procedure: POLYPECTOMY;  Surgeon: Daneil Dolin, MD;  [facility];  Service: Endoscopy;  Laterality: N/A;  cecal polyp  . TONSILLECTOMY      Social History   Social History  . Marital status: Divorced    Spouse name: N/A  . Number of children: 1  . Years of  education: N/A   Occupational History  . trying to get disability    Social History Main Topics  . Smoking status: Former Smoker    Packs/day: 1.00    Years: 47.00    Types: Cigarettes    Quit date: 07/13/2015  . Smokeless tobacco: Never Used  . Alcohol use 0.0 oz/week     Comment: 08/15/2015 "stopped drinking 3-4 years ago"  . Drug use:     Types: Cocaine     Comment: 08/15/2015 "last used cocaine 3-4 months ago"  . Sexual activity: No   Other Topics Concern  . Not on file   Social History Narrative  . No narrative on file   Family History  Problem Relation Age of Onset  . Breast cancer Mother   . Heart failure Mother   . Diabetes Father   . Hypertension Father   . Heart disease Father   . Hyperlipidemia Father   . Diabetes Sister   . Hypertension Sister   . Breast cancer Sister   . Colon cancer Neg Hx   . Liver disease Neg Hx       VITAL SIGNS BP (!) 148/78   Pulse 76   Temp 97.3 F (36.3 C) (Oral)   Resp 18   Ht 5\' 10"  (1.778 m)   Wt 234 lb (106.1 kg)   SpO2 98%   BMI 33.58 kg/m   Patient's Medications  New Prescriptions   No medications on file  Previous Medications   ACIDOPHILUS (RISAQUAD) CAPS CAPSULE    Take 2 capsules by mouth daily.   ASPIRIN EC 81 MG TABLET    Take 1 tablet (81 mg total) by mouth daily.   CARVEDILOL (COREG) 3.125 MG TABLET    Take 1 tablet (3.125 mg total) by mouth 2 (two) times daily with a meal.   CETIRIZINE (ZYRTEC) 10 MG TABLET    Take 10 mg by mouth daily.   FAMOTIDINE (PEPCID) 20 MG TABLET    Take 1 tablet (20 mg total) by mouth daily.   FERROUS SULFATE 325 (65 FE) MG TABLET    Take 1 tablet (325 mg total) by mouth 3 (three) times daily with meals.   FOLIC ACID (FOLVITE) 1 MG TABLET    Take 1 tablet (1 mg total) by mouth daily.   GABAPENTIN (NEURONTIN) 600 MG TABLET    Take 600 mg by mouth 3 (three) times daily.   GUAIFENESIN 1200 MG TB12    Take 1 tablet by mouth every 12 (twelve) hours.   HYDROCODONE-ACETAMINOPHEN  (NORCO/VICODIN) 5-325 MG TABLET    Take 1-2 tablets by mouth every 4 (four) hours as needed for moderate pain.   INSULIN ASPART (NOVOLOG) 100 UNIT/ML INJECTION    Inject 6 Units into the skin 3 (three) times daily before meals.    INSULIN GLARGINE (LANTUS) 100 UNIT/ML INJECTION    Inject 30 Units into the skin at bedtime.    METFORMIN (GLUCOPHAGE) 500 MG TABLET    Take 500 mg by mouth daily with breakfast.   MULTIPLE VITAMINS-MINERALS (DECUBI-VITE PO)  Take 1 capsule by mouth daily.   MUPIROCIN OINTMENT (BACTROBAN) 2 %    Place 1 application into the nose 2 (two) times daily.   OXYCODONE HCL, ABUSE DETER, (OXAYDO) 5 MG TABA    Take 1-2 tablets by mouth every 4 (four) hours as needed.   POLYETHYLENE GLYCOL (MIRALAX / GLYCOLAX) PACKET    Take 17 g by mouth daily.   SENNA-DOCUSATE (SENOKOT-S) 8.6-50 MG TABLET    Take 2 tablets by mouth daily as needed for mild constipation.   SIMVASTATIN (ZOCOR) 20 MG TABLET    Take 20 mg by mouth daily at 6 PM.   TAMSULOSIN (FLOMAX) 0.4 MG CAPS CAPSULE    Take 1 capsule (0.4 mg total) by mouth daily.   THIAMINE 100 MG TABLET    Take 1 tablet (100 mg total) by mouth daily.   TORSEMIDE (DEMADEX) 20 MG TABLET    Take 20 mg by mouth once.    VITAMIN B-12 500 MCG TABLET    Take 1 tablet (500 mcg total) by mouth daily.  Modified Medications   No medications on file  Discontinued Medications   No medications on file     SIGNIFICANT DIAGNOSTIC EXAMS  08-03-15: TEE: - Left ventricle: Septal and apical hypokinesis. The cavity size was mildly dilated. Wall thickness was increased in a pattern of moderate LVH. Systolic function was mildly reduced. The estimated ejection fraction was in the range of 45% to 50%. Wall motion was normal; there were no regional wall motion abnormalities. Left ventricular diastolic function parameters were normal. - Aortic valve: Severely calcified non coronary cusp. - Mitral valve: Calcified annulus. Mildly thickened leaflets . - Atrial  septum: No defect or patent foramen ovale was identified.  09-05-15: chest x-ray: No acute cardiopulmonary disease.  11-03-15: abdominal ultrasound: no acute abnormality  11-25-15: chest x-ray: No acute cardiopulmonary process seen.  11-26-15: ct of chest: 1. Clusters of nodules and "tree-in-bud" opacities in the right greater than left lung apices compatible with bronchiolitis. 2. Aortic valvular calcification associated with aortic stenosis. 3. Aortic atherosclerosis. 4. Moderate coronary artery calcification.  12-23-15: chest x-ray: no acute cardiopulmonary disease process       LABS REVIEWED:   08-31-15: wbc 10.7; hgb 8.7; hct 27.5; mcv 86.8; plt 453; glucose 163; bun 32; creat 1.42; k+4.1; na++132; liver normal albumin 2.6 08-26-15: wbc 9.1; hgb 8.9; hct 28.5; mcv 86.6; plt 572; glucose 140; bun 26; creat 1.18; k+ 4.4; na++132 09-01-15: wbc 8.0; hgb 8.7; hct 27.6;mcv 87.9; plt 456; glucose 128; bun 33; creat 1.26; k+ 5.5; na++ 134 09-04-15: glucose 117; bun 24; creat 1.02; k+ 4.7; na++ 134  09-18-15: glucose 166; bun 10.4; creat 0.93; k+ 4.1; na++ 140  11-25-15: wbc 8.9; hgb 6.3; hct 70.6; mcv 76.3; plt 462; glucose 397; bun 14; creat 1.06; k+ 4.1; na++ 129 11-26-15; hgb a1c 7.6; vit B 12: 287; iron 67; tibc 451; ferritin 8; ldh 149  12-13-15: hgb 8.4 hct 27.7 12-27-15: glucose 520; bun 16.1; creat 1.00; k+ 4.6; na++ 134    Review of Systems  Constitutional: Negative for malaise/fatigue.  Respiratory: Negative for cough and shortness of breath.   Cardiovascular: Negative for chest pain, palpitations has right lower extremity edema  Gastrointestinal: Negative for abdominal pain, constipation and heartburn.  Musculoskeletal: Negative for back pain, joint pain and myalgias.  Skin: Negative.   Neurological: Negative for dizziness.  Psychiatric/Behavioral: The patient is not nervous/anxious.     Physical Exam  Constitutional: No distress.  Eyes: Conjunctivae  are normal.  Neck: Neck  supple. No JVD present. No thyromegaly present.  Cardiovascular: Normal rate, regular rhythm and intact distal pulses.   Respiratory: Effort normal and breath sounds normal. No respiratory distress. He has no wheezes.  GI: Soft. Bowel sounds are normal. He exhibits no distension. There is no tenderness.  Musculoskeletal: 2+ right lower extremity edema   Able to move all extremities  Status post left bka  Lymphadenopathy:    He has no cervical adenopathy.  Neurological: He is alert.  Skin: Skin is warm and dry. He is not diaphoretic.  Psychiatric: He has a normal mood and affect.     ASSESSMENT/ PLAN:  1. Diabetes; hgb a1c 7.6: will change to  lantus 40 units daily will change novolog to 12 units after meals;  Will continue metformin 500 mg daily  and will monitor his status  2.  5. Dyslipidemia: will continue zocor 20 mg daily   Will check lipids and urine for micro-albumin     MD is aware of resident's narcotic use and is in agreement with current plan of care. We will attempt to wean resident as apropriate   Ok [facility] NP [facility]  Contact [phone] Monday through Friday 8am- 5pm  After hours call [phone]

## 2016-01-09 NOTE — Patient Instructions (Signed)
Schedule [facility]   Your physician recommends that you schedule a follow-up appointment the end of December with Dr.Koneswaren in [place]

## 2016-01-12 LAB — MICROALBUMIN, URINE
MICROALB UR: 1.2
Microalb, Ur: 1.2

## 2016-01-17 ENCOUNTER — Non-Acute Institutional Stay (SKILLED_NURSING_FACILITY): Payer: Medicaid Other | Admitting: Adult Health

## 2016-01-17 ENCOUNTER — Encounter: Payer: Self-pay | Admitting: Adult Health

## 2016-01-17 DIAGNOSIS — E1165 Type 2 diabetes mellitus with hyperglycemia: Secondary | ICD-10-CM

## 2016-01-17 DIAGNOSIS — M10371 Gout due to renal impairment, right ankle and foot: Secondary | ICD-10-CM

## 2016-01-17 DIAGNOSIS — E1143 Type 2 diabetes mellitus with diabetic autonomic (poly)neuropathy: Secondary | ICD-10-CM | POA: Insufficient documentation

## 2016-01-17 DIAGNOSIS — IMO0002 Reserved for concepts with insufficient information to code with codable children: Secondary | ICD-10-CM | POA: Insufficient documentation

## 2016-01-17 NOTE — Progress Notes (Signed)
Patient ID: Cory Weber, male   DOB: January 05, 1951, 65 y.o.   MRN: [id number]   Location:   [facility] Room Number: 223-B Place of Service:  SNF (31)   CODE STATUS: Full Code  No Known Allergies  Chief Complaint  Patient presents with  . Acute Visit    HPI:  He has been complaining of gouty pain in his right great toe. His uric acid level is elevated at 8.3. We discussed his results and treatment options.    Past Medical History:  Diagnosis Date  . Anemia   . Asthma   . Cellulitis and abscess of foot 07/2015  . CVA (cerebral vascular accident) ([place]) 2015   denies residual on 08/15/2015  . Hepatitis C    states he was diagnosed in 2007 or 2007 while living in [place], [place]  . Hyperlipidemia   . Hypertension   . Peripheral neuropathy ([place])   . Pneumonia X 1  . PVD (peripheral vascular disease) ([place])   . Type II diabetes mellitus ([place])     Past Surgical History:  Procedure Laterality Date  . AMPUTATION Left 08/16/2015   Procedure: LEFT BELOW THE KNEE AMPUTATION ;  Surgeon: Serafina Mitchell, MD;  Location: [facility];  Service: Vascular;  Laterality: Left;  . BACK SURGERY    . BIOPSY N/A 09/03/2012   Procedure: BIOPSY;  Surgeon: Daneil Dolin, MD;  Location: [facility];  Service: Endoscopy;  Laterality: N/A;  gastric and gastric mucosa  . BIOPSY N/A 12/03/2012   Procedure: BIOPSY;  Surgeon: Daneil Dolin, MD;  Location: [facility];  Service: Endoscopy;  Laterality: N/A;  . COLONOSCOPY WITH PROPOFOL N/A 09/03/2012   PQZ:[id number] polyp-removed as outlined above. Prominent internal hemorrhoids. Tubular adenoma  . ESOPHAGOGASTRODUODENOSCOPY (EGD) WITH PROPOFOL N/A 09/03/2012   QJF:[id number] hernia. Gastric diverticulum. Gastric ulcers with associated erosions. Duodenal erosions. Status post gastric biopsy. H.PYLORI gastritis   . ESOPHAGOGASTRODUODENOSCOPY (EGD) WITH PROPOFOL N/A 12/03/2012   Dr. Gala Romney: gastric diverticulum, gastric erosions and scar. Previously noted gastric ulcer  completed healed. Biopsy without H.pylori.   Marland Kitchen LIVER BIOPSY  2005   Done in [place], [place]. Chronic hepatitis with mild periportal inflammation, lobular unicellular necrosis and portal fibrosis. Grade 2, stage 1-2.  Marland Kitchen MAXIMUM ACCESS (MAS)POSTERIOR LUMBAR INTERBODY FUSION (PLIF) 1 LEVEL N/A 11/25/2013   Procedure: FOR MAXIMUM ACCESS (MAS) POSTERIOR LUMBAR INTERBODY FUSION (PLIF) 1 LEVEL;  Surgeon: Eustace Moore, MD;  Location: [place];  Service: Neurosurgery;  Laterality: N/A;  FOR MAXIMUM ACCESS (MAS) POSTERIOR LUMBAR INTERBODY FUSION (PLIF) 1 LEVEL LUMBAR 3-4  . PERIPHERAL VASCULAR CATHETERIZATION Left 08/01/2015   Procedure: Lower Extremity Angiography;  Surgeon: Serafina Mitchell, MD;  Location: [facility] CV [facility];  Service: Cardiovascular;  Laterality: Left;  . PERIPHERAL VASCULAR CATHETERIZATION N/A 08/01/2015   Procedure: Abdominal Aortogram;  Surgeon: Serafina Mitchell, MD;  Location: [facility];  Service: Cardiovascular;  Laterality: N/A;  . PERIPHERAL VASCULAR CATHETERIZATION N/A 08/08/2015   Procedure: Abdominal Aortogram w/Lower Extremity;  Surgeon: Serafina Mitchell, MD;  Location: [place] [facility];  Service: Cardiovascular;  Laterality: N/A;  . PERIPHERAL VASCULAR CATHETERIZATION Left 08/08/2015   Procedure: Peripheral Vascular Balloon Angioplasty;  Surgeon: Serafina Mitchell, MD;  Location: [place] [facility];  Service: Cardiovascular;  Laterality: Left;  left popiteal artery, left peronealtrunk, left post tibial  . POLYPECTOMY N/A 09/03/2012   Procedure: POLYPECTOMY;  Surgeon: Daneil Dolin, MD;  Location[facility];  Service: Endoscopy;  Laterality: N/A;  cecal polyp  . TONSILLECTOMY      Social History   Social History  . Marital status: Divorced    Spouse name: N/A  . Number of children: 1  . Years of education: N/A   Occupational History  . trying to get disability    Social History Main Topics  . Smoking status: Former Smoker    Packs/day: 1.00    Years: 47.00     Types: Cigarettes    Quit date: 07/13/2015  . Smokeless tobacco: Never Used  . Alcohol use 0.0 oz/week     Comment: 08/15/2015 "stopped drinking 3-4 years ago"  . Drug use:     Types: Cocaine     Comment: 08/15/2015 "last used cocaine 3-4 months ago"  . Sexual activity: No   Other Topics Concern  . Not on file   Social History Narrative  . No narrative on file   Family History  Problem Relation Age of Onset  . Breast cancer Mother   . Heart failure Mother   . Diabetes Father   . Hypertension Father   . Heart disease Father   . Hyperlipidemia Father   . Diabetes Sister   . Hypertension Sister   . Breast cancer Sister   . Colon cancer Neg Hx   . Liver disease Neg Hx       VITAL SIGNS BP (!) 143/78   Pulse 98   Temp 97.3 F (36.3 C) (Oral)   Resp 18   Ht 5\' 10"  (1.778 m)   Wt 242 lb (109.8 kg)   SpO2 98%   BMI 34.72 kg/m   Patient's Medications  New Prescriptions   No medications on file  Previous Medications   ACIDOPHILUS (RISAQUAD) CAPS CAPSULE    Take 2 capsules by mouth daily.   ASPIRIN EC 81 MG TABLET    Take 1 tablet (81 mg total) by mouth daily.   CARVEDILOL (COREG) 3.125 MG TABLET    Take 1 tablet (3.125 mg total) by mouth 2 (two) times daily with a meal.   CETIRIZINE (ZYRTEC) 10 MG TABLET    Take 10 mg by mouth daily.   FAMOTIDINE (PEPCID) 20 MG TABLET    Take 1 tablet (20 mg total) by mouth daily.   FERROUS SULFATE 325 (65 FE) MG TABLET    Take 1 tablet (325 mg total) by mouth 3 (three) times daily with meals.   FOLIC ACID (FOLVITE) 1 MG TABLET    Take 1 tablet (1 mg total) by mouth daily.   GABAPENTIN (NEURONTIN) 600 MG TABLET    Take 600 mg by mouth 3 (three) times daily.   GUAIFENESIN 1200 MG TB12    Take 1 tablet by mouth every 12 (twelve) hours.   HYDROCODONE-ACETAMINOPHEN (NORCO/VICODIN) 5-325 MG TABLET    Take 1-2 tablets by mouth every 4 (four) hours as needed for moderate pain.   INSULIN ASPART (NOVOLOG) 100 UNIT/ML INJECTION    Inject 6 Units into  the skin 3 (three) times daily before meals.    INSULIN GLARGINE (LANTUS) 100 UNIT/ML INJECTION    Inject 30 Units into the skin at bedtime.    METFORMIN (GLUCOPHAGE) 500 MG TABLET    Take 500 mg by mouth daily with breakfast.   MULTIPLE VITAMINS-MINERALS (DECUBI-VITE PO)    Take 1 capsule by mouth daily.   MUPIROCIN OINTMENT (BACTROBAN) 2 %    Place 1 application into the nose 2 (two) times daily.   OXYCODONE HCL, ABUSE  DETER, (OXAYDO) 5 MG TABA    Take 1-2 tablets by mouth every 4 (four) hours as needed (PAIN).    POLYETHYLENE GLYCOL (MIRALAX / GLYCOLAX) PACKET    Take 17 g by mouth daily.   SENNOSIDES-DOCUSATE SODIUM (SENOKOT-S) 8.6-50 MG TABLET    Take 2 tablets by mouth as needed for constipation.    SIMVASTATIN (ZOCOR) 20 MG TABLET    Take 20 mg by mouth daily at 6 PM.   TAMSULOSIN (FLOMAX) 0.4 MG CAPS CAPSULE    Take 1 capsule (0.4 mg total) by mouth daily.   THIAMINE 100 MG TABLET    Take 1 tablet (100 mg total) by mouth daily.   TORSEMIDE (DEMADEX) 20 MG TABLET    Take 20 mg by mouth once.    VITAMIN B-12 500 MCG TABLET    Take 1 tablet (500 mcg total) by mouth daily.  Modified Medications   No medications on file  Discontinued Medications   No medications on file     SIGNIFICANT DIAGNOSTIC EXAMS  08-03-15: TEE: - Left ventricle: Septal and apical hypokinesis. The cavity size was mildly dilated. Wall thickness was increased in a pattern of moderate LVH. Systolic function was mildly reduced. The estimated ejection fraction was in the range of 45% to 50%. Wall motion was normal; there were no regional wall motion abnormalities. Left ventricular diastolic function parameters were normal. - Aortic valve: Severely calcified non coronary cusp. - Mitral valve: Calcified annulus. Mildly thickened leaflets . - Atrial septum: No defect or patent foramen ovale was identified.  09-05-15: chest x-ray: No acute cardiopulmonary disease.  11-03-15: abdominal ultrasound: no acute  abnormality  11-25-15: chest x-ray: No acute cardiopulmonary process seen.  11-26-15: ct of chest: 1. Clusters of nodules and "tree-in-bud" opacities in the right greater than left lung apices compatible with bronchiolitis. 2. Aortic valvular calcification associated with aortic stenosis. 3. Aortic atherosclerosis. 4. Moderate coronary artery calcification.      LABS REVIEWED:   08-31-15: wbc 10.7; hgb 8.7; hct 27.5; mcv 86.8; plt 453; glucose 163; bun 32; creat 1.42; k+4.1; na++132; liver normal albumin 2.6 08-26-15: wbc 9.1; hgb 8.9; hct 28.5; mcv 86.6; plt 572; glucose 140; bun 26; creat 1.18; k+ 4.4; na++132 09-01-15: wbc 8.0; hgb 8.7; hct 27.6;mcv 87.9; plt 456; glucose 128; bun 33; creat 1.26; k+ 5.5; na++ 134 09-04-15: glucose 117; bun 24; creat 1.02; k+ 4.7; na++ 134  09-18-15: glucose 166; bun 10.4; creat 0.93; k+ 4.1; na++ 140  11-25-15: wbc 8.9; hgb 6.3; hct 70.6; mcv 76.3; plt 462; glucose 397; bun 14; creat 1.06; k+ 4.1; na++ 129 11-26-15; hgb a1c 7.6; vit B 12: 287; iron 67; tibc 451; ferritin 8; ldh 149  01-12-16: urine micro-albumin <1.2 01-17-16: uric acid; 8.3   Review of Systems  Constitutional: Negative for malaise/fatigue.  Respiratory: Negative for cough and shortness of breath.   Cardiovascular: Negative for chest pain, palpitations has right lower extremity edema  Gastrointestinal: Negative for abdominal pain, constipation and heartburn.  Musculoskeletal: Negative for back pain,myalgias. has right great toe pain  Skin: Negative.   Neurological: Negative for dizziness.  Psychiatric/Behavioral: The patient is not nervous/anxious.     Physical Exam  Constitutional: No distress.  Eyes: Conjunctivae are normal.  Neck: Neck supple. No JVD present. No thyromegaly present.  Cardiovascular: Normal rate, regular rhythm and intact distal pulses.   Respiratory: Effort normal and breath sounds normal. No respiratory distress. He has no wheezes.  GI: Soft. Bowel sounds are  normal. He  exhibits no distension. There is no tenderness.  Musculoskeletal: 2+ right lower extremity edema   Able to move all extremities  Status post left bka Right great toe red and tender  Lymphadenopathy:    He has no cervical adenopathy.  Neurological: He is alert.  Skin: Skin is warm and dry. He is not diaphoretic.  Psychiatric: He has a normal mood and affect.     ASSESSMENT/ PLAN:  1. Gout: an acute flair: will being colchicine 0.6 mg twice daily for one week; will begin allopurinol 100 mg daily and will monitor   MD is aware of resident's narcotic use and is in agreement with current plan of care. We will attempt to wean resident as apropriate   Ok Edwards NP [facility]  Contact [phone] Monday through Friday 8am- 5pm  After hours call [phone]

## 2016-01-19 ENCOUNTER — Encounter (HOSPITAL_COMMUNITY): Payer: Self-pay

## 2016-01-19 ENCOUNTER — Non-Acute Institutional Stay (SKILLED_NURSING_FACILITY): Payer: Medicaid Other | Admitting: Adult Health

## 2016-01-19 ENCOUNTER — Encounter: Payer: Self-pay | Admitting: Adult Health

## 2016-01-19 DIAGNOSIS — IMO0002 Reserved for concepts with insufficient information to code with codable children: Secondary | ICD-10-CM

## 2016-01-19 DIAGNOSIS — E1165 Type 2 diabetes mellitus with hyperglycemia: Secondary | ICD-10-CM

## 2016-01-19 DIAGNOSIS — E1143 Type 2 diabetes mellitus with diabetic autonomic (poly)neuropathy: Secondary | ICD-10-CM

## 2016-01-19 NOTE — Progress Notes (Signed)
Patient ID: Cory Weber, male   DOB: 1951/02/11, 65 y.o.   MRN: [id number]   Location:   [facility] Room Number: 110-B Place of Service:  SNF (31)   CODE STATUS: Full Code  No Known Allergies  Chief Complaint  Patient presents with  . Acute Visit    Diabetes    HPI:  Staff reports that his cbg's have been elevated. All of his readings are significantly elevated. He denies any symptoms. He tells me that he is feeling good and has no complaints.    Past Medical History:  Diagnosis Date  . Anemia   . Asthma   . Cellulitis and abscess of foot 07/2015  . CVA (cerebral vascular accident) ([place]) 2015   denies residual on 08/15/2015  . Hepatitis C    states he was diagnosed in 2007 or 2007 while living in [place], [place]  . Hyperlipidemia   . Hypertension   . Peripheral neuropathy ([place])   . Pneumonia X 1  . PVD (peripheral vascular disease) ([place])   . Type II diabetes mellitus ([place])     Past Surgical History:  Procedure Laterality Date  . AMPUTATION Left 08/16/2015   Procedure: LEFT BELOW THE KNEE AMPUTATION ;  Surgeon: Serafina Mitchell, MD;  Location: [facility];  Service: Vascular;  Laterality: Left;  . BACK SURGERY    . BIOPSY N/A 09/03/2012   Procedure: BIOPSY;  Surgeon: Daneil Dolin, MD;  Location: [facility];  Service: Endoscopy;  Laterality: N/A;  gastric and gastric mucosa  . BIOPSY N/A 12/03/2012   Procedure: BIOPSY;  Surgeon: Daneil Dolin, MD;  Location: [facility];  Service: Endoscopy;  Laterality: N/A;  . COLONOSCOPY WITH PROPOFOL N/A 09/03/2012   [id number]:[id number] polyp-removed as outlined above. Prominent internal hemorrhoids. Tubular adenoma  . ESOPHAGOGASTRODUODENOSCOPY (EGD) WITH PROPOFOL N/A 09/03/2012   [id number] hernia. Gastric diverticulum. Gastric ulcers with associated erosions. Duodenal erosions. Status post gastric biopsy. H.PYLORI gastritis   . ESOPHAGOGASTRODUODENOSCOPY (EGD) WITH PROPOFOL N/A 12/03/2012   Dr. Gala Romney: gastric diverticulum, gastric  erosions and scar. Previously noted gastric ulcer completed healed. Biopsy without H.pylori.   Marland Kitchen LIVER BIOPSY  2005   Done in [place], [place]. Chronic hepatitis with mild periportal inflammation, lobular unicellular necrosis and portal fibrosis. Grade 2, stage 1-2.  Marland Kitchen MAXIMUM ACCESS (MAS)POSTERIOR LUMBAR INTERBODY FUSION (PLIF) 1 LEVEL N/A 11/25/2013   Procedure: FOR MAXIMUM ACCESS (MAS) POSTERIOR LUMBAR INTERBODY FUSION (PLIF) 1 LEVEL;  Surgeon: Eustace Moore, MD;  Location: [place] [facility];  Service: Neurosurgery;  Laterality: N/A;  FOR MAXIMUM ACCESS (MAS) POSTERIOR LUMBAR INTERBODY FUSION (PLIF) 1 LEVEL LUMBAR 3-4  . PERIPHERAL VASCULAR CATHETERIZATION Left 08/01/2015   Procedure: Lower Extremity Angiography;  Surgeon: Serafina Mitchell, MD;  Location: [facility];  Service: Cardiovascular;  Laterality: Left;  . PERIPHERAL VASCULAR CATHETERIZATION N/A 08/01/2015   Procedure: Abdominal Aortogram;  Surgeon: Serafina Mitchell, MD;  Location: [place] [facility];  Service: Cardiovascular;  Laterality: N/A;  . PERIPHERAL VASCULAR CATHETERIZATION N/A 08/08/2015   Procedure: Abdominal Aortogram w/Lower Extremity;  Surgeon: Serafina Mitchell, MD;  Location: [place] [facility];  Service: Cardiovascular;  Laterality: N/A;  . PERIPHERAL VASCULAR CATHETERIZATION Left 08/08/2015   Procedure: Peripheral Vascular Balloon Angioplasty;  Surgeon: Serafina Mitchell, MD;  Location: [place] [facility];  Service: Cardiovascular;  Laterality: Left;  left popiteal artery, left peronealtrunk, left post tibial  . POLYPECTOMY N/A 09/03/2012   Procedure: POLYPECTOMY;  Surgeon: Daneil Dolin, MD;  [facility];  Service: Endoscopy;  Laterality: N/A;  cecal polyp  . TONSILLECTOMY      Social History   Social History  . Marital status: Divorced    Spouse name: N/A  . Number of children: 1  . Years of education: N/A   Occupational History  . trying to get disability    Social History Main Topics  . Smoking status:  Former Smoker    Packs/day: 1.00    Years: 47.00    Types: Cigarettes    Quit date: 07/13/2015  . Smokeless tobacco: Never Used  . Alcohol use 0.0 oz/week     Comment: 08/15/2015 "stopped drinking 3-4 years ago"  . Drug use:     Types: Cocaine     Comment: 08/15/2015 "last used cocaine 3-4 months ago"  . Sexual activity: No   Other Topics Concern  . Not on file   Social History Narrative  . No narrative on file   Family History  Problem Relation Age of Onset  . Breast cancer Mother   . Heart failure Mother   . Diabetes Father   . Hypertension Father   . Heart disease Father   . Hyperlipidemia Father   . Diabetes Sister   . Hypertension Sister   . Breast cancer Sister   . Colon cancer Neg Hx   . Liver disease Neg Hx       VITAL SIGNS BP (!) 143/78   Pulse 98   Temp 97.3 F (36.3 C) (Oral)   Resp 18   Ht 5\' 10"  (1.778 m)   Wt 241 lb (109.3 kg)   SpO2 98%   BMI 34.58 kg/m   Patient's Medications  New Prescriptions   No medications on file  Previous Medications   ACIDOPHILUS (RISAQUAD) CAPS CAPSULE    Take 2 capsules by mouth daily.   ALLOPURINOL (ZYLOPRIM) 100 MG TABLET    Take 100 mg by mouth daily.   ASPIRIN EC 81 MG TABLET    Take 1 tablet (81 mg total) by mouth daily.   CARVEDILOL (COREG) 3.125 MG TABLET    Take 1 tablet (3.125 mg total) by mouth 2 (two) times daily with a meal.   CETIRIZINE (ZYRTEC) 10 MG TABLET    Take 10 mg by mouth daily.   FAMOTIDINE (PEPCID) 20 MG TABLET    Take 1 tablet (20 mg total) by mouth daily.   FERROUS SULFATE 325 (65 FE) MG TABLET    Take 1 tablet (325 mg total) by mouth 3 (three) times daily with meals.   FOLIC ACID (FOLVITE) 1 MG TABLET    Take 1 tablet (1 mg total) by mouth daily.   GABAPENTIN (NEURONTIN) 600 MG TABLET    Take 600 mg by mouth 3 (three) times daily.   GUAIFENESIN 1200 MG TB12    Take 1 tablet by mouth every 12 (twelve) hours.   HYDROCODONE-ACETAMINOPHEN (NORCO/VICODIN) 5-325 MG TABLET    Take 1-2 tablets by  mouth every 4 (four) hours as needed for moderate pain.   INSULIN ASPART (NOVOLOG) 100 UNIT/ML INJECTION    Inject 12 Units into the skin 3 (three) times daily before meals.    INSULIN GLARGINE (LANTUS) 100 UNIT/ML INJECTION    Inject 40 Units into the skin at bedtime.    METFORMIN (GLUCOPHAGE) 500 MG TABLET    Take 500 mg by mouth daily with breakfast.   MULTIPLE VITAMINS-MINERALS (DECUBI-VITE PO)    Take 1 capsule by mouth daily.  MUPIROCIN OINTMENT (BACTROBAN) 2 %    Place 1 application into the nose 2 (two) times daily.   OXYCODONE HCL, ABUSE DETER, (OXAYDO) 5 MG TABA    Take 1-2 tablets by mouth every 4 (four) hours as needed (PAIN).    POLYETHYLENE GLYCOL (MIRALAX / GLYCOLAX) PACKET    Take 17 g by mouth daily.   SENNOSIDES-DOCUSATE SODIUM (SENOKOT-S) 8.6-50 MG TABLET    Take 2 tablets by mouth as needed for constipation.    SIMVASTATIN (ZOCOR) 20 MG TABLET    Take 20 mg by mouth daily at 6 PM.   TAMSULOSIN (FLOMAX) 0.4 MG CAPS CAPSULE    Take 1 capsule (0.4 mg total) by mouth daily.   THIAMINE 100 MG TABLET    Take 1 tablet (100 mg total) by mouth daily.   TORSEMIDE (DEMADEX) 20 MG TABLET    Take 20 mg by mouth 2 (two) times daily.    VITAMIN B-12 500 MCG TABLET    Take 1 tablet (500 mcg total) by mouth daily.  Modified Medications   No medications on file  Discontinued Medications   No medications on file     SIGNIFICANT DIAGNOSTIC EXAMS  08-03-15: TEE: - Left ventricle: Septal and apical hypokinesis. The cavity size was mildly dilated. Wall thickness was increased in a pattern of moderate LVH. Systolic function was mildly reduced. The estimated ejection fraction was in the range of 45% to 50%. Wall motion was normal; there were no regional wall motion abnormalities. Left ventricular diastolic function parameters were normal. - Aortic valve: Severely calcified non coronary cusp. - Mitral valve: Calcified annulus. Mildly thickened leaflets . - Atrial septum: No defect or patent  foramen ovale was identified.  09-05-15: chest x-ray: No acute cardiopulmonary disease.  11-03-15: abdominal ultrasound: no acute abnormality  11-25-15: chest x-ray: No acute cardiopulmonary process seen.  11-26-15: ct of chest: 1. Clusters of nodules and "tree-in-bud" opacities in the right greater than left lung apices compatible with bronchiolitis. 2. Aortic valvular calcification associated with aortic stenosis. 3. Aortic atherosclerosis. 4. Moderate coronary artery calcification.      LABS REVIEWED:   08-31-15: wbc 10.7; hgb 8.7; hct 27.5; mcv 86.8; plt 453; glucose 163; bun 32; creat 1.42; k+4.1; na++132; liver normal albumin 2.6 08-26-15: wbc 9.1; hgb 8.9; hct 28.5; mcv 86.6; plt 572; glucose 140; bun 26; creat 1.18; k+ 4.4; na++132 09-01-15: wbc 8.0; hgb 8.7; hct 27.6;mcv 87.9; plt 456; glucose 128; bun 33; creat 1.26; k+ 5.5; na++ 134 09-04-15: glucose 117; bun 24; creat 1.02; k+ 4.7; na++ 134  09-18-15: glucose 166; bun 10.4; creat 0.93; k+ 4.1; na++ 140  11-25-15: wbc 8.9; hgb 6.3; hct 70.6; mcv 76.3; plt 462; glucose 397; bun 14; creat 1.06; k+ 4.1; na++ 129 11-26-15; hgb a1c 7.6; vit B 12: 287; iron 67; tibc 451; ferritin 8; ldh 149     Review of Systems  Constitutional: Negative for malaise/fatigue.  Respiratory: Negative for cough and shortness of breath.   Cardiovascular: Negative for chest pain, palpitations has right lower extremity edema  Gastrointestinal: Negative for abdominal pain, constipation and heartburn.  Musculoskeletal: Negative for back pain, joint pain and myalgias.  Skin: Negative.   Neurological: Negative for dizziness.  Psychiatric/Behavioral: The patient is not nervous/anxious.     Physical Exam  Constitutional: No distress.  Eyes: Conjunctivae are normal.  Neck: Neck supple. No JVD present. No thyromegaly present.  Cardiovascular: Normal rate, regular rhythm and intact distal pulses.   Respiratory: Effort normal and breath  sounds normal. No  respiratory distress. He has no wheezes.  GI: Soft. Bowel sounds are normal. He exhibits no distension. There is no tenderness.  Musculoskeletal: 2+ right lower extremity edema   Able to move all extremities  Status post left bka  Lymphadenopathy:    He has no cervical adenopathy.  Neurological: He is alert.  Skin: Skin is warm and dry. He is not diaphoretic.  Psychiatric: He has a normal mood and affect.     ASSESSMENT/ PLAN:  1. Diabetes: hgb a1c 7.6 will continue metformin 500 mg daily will increase lantus to 50 units nightly and will increase novolog to 20 units with meals. Will continue to monitor his status.       MD is aware of resident's narcotic use and is in agreement with current plan of care. We will attempt to wean resident as apropriate   Ok Edwards NP [facility]  Contact [phone] Monday through Friday 8am- 5pm  After hours call [phone]

## 2016-01-19 NOTE — Progress Notes (Signed)
Preop instructions for: Cory Weber            Date of Birth 02-Dec-1950                           Date of Procedure:  01/23/16     Doctor: Loletha Carrow Time to arrive at [facility]: Report to: Admitting Procedure: EGD Any procedure time changes, MD office will notify you!   May have clear liquids up until 5:30am.  No clear liquids after 5:30am day of procedure.  Take these morning medications only with sips of water.(or give through gastrostomy or feeding tube). Take Lantus 20 units the night before procedure.  Can take Flomax 0.4mg , Pepcid 20mg , Zyrtec 10mg , coreg 3.125mg , gabapentin 600mg  the morning of the procedure.    Note: No Insulin or Diabetic meds should be given or taken the morning of the procedure!   Facility contact: Glenna Fellows, RN            Phone: [phone]                 Health Care POA: Patient signs own consent.  Transportation contact phone#: Facility to provide transportation  Please send day of procedure:current med list and meds last taken that day, confirm nothing by mouth status from what time, Patient Demographic info( to include DNR status, problem list, allergies)   RN contact name/phone#:  Glenna Fellows, RN                           and Fax #: [phone]  Bring Insurance card and picture ID Leave all jewelry and other valuables at place where living( no metal or rings to be worn) No contact lens Women-no make-up, no lotions,perfumes,powders Men-no colognes,lotions  Any questions day of procedure,call Endoscopy unit-[phone]!   Sent from :[facility]                   [place]                   Fax:[phone]  Sent by :Sadie Haber

## 2016-01-19 NOTE — Progress Notes (Signed)
Spoke with kim taylor rn reciened all faxed instructions regarding 01-23-16 procedure and has no questions

## 2016-01-21 DIAGNOSIS — E1121 Type 2 diabetes mellitus with diabetic nephropathy: Secondary | ICD-10-CM | POA: Insufficient documentation

## 2016-01-21 DIAGNOSIS — E1169 Type 2 diabetes mellitus with other specified complication: Secondary | ICD-10-CM | POA: Insufficient documentation

## 2016-01-21 DIAGNOSIS — E785 Hyperlipidemia, unspecified: Secondary | ICD-10-CM

## 2016-01-23 ENCOUNTER — Encounter (HOSPITAL_COMMUNITY): Payer: Self-pay

## 2016-01-23 ENCOUNTER — Telehealth (HOSPITAL_COMMUNITY): Payer: Self-pay | Admitting: *Deleted

## 2016-01-23 ENCOUNTER — Ambulatory Visit (HOSPITAL_COMMUNITY): Payer: Medicaid Other | Admitting: Certified Registered Nurse Anesthetist

## 2016-01-23 ENCOUNTER — Encounter (HOSPITAL_COMMUNITY)
Admission: RE | Disposition: A | Discharge status: Home / Self Care | Payer: Self-pay | Source: Ambulatory Visit | Attending: Gastroenterology

## 2016-01-23 ENCOUNTER — Ambulatory Visit (HOSPITAL_COMMUNITY)
Admission: RE | Admit: 2016-01-23 | Discharge: 2016-01-23 | Disposition: A | Discharge status: Home / Self Care | Payer: Medicaid Other | Source: Ambulatory Visit | Attending: Gastroenterology | Admitting: Gastroenterology

## 2016-01-23 DIAGNOSIS — Z87891 Personal history of nicotine dependence: Secondary | ICD-10-CM | POA: Diagnosis not present

## 2016-01-23 DIAGNOSIS — B182 Chronic viral hepatitis C: Secondary | ICD-10-CM

## 2016-01-23 DIAGNOSIS — E1142 Type 2 diabetes mellitus with diabetic polyneuropathy: Secondary | ICD-10-CM | POA: Insufficient documentation

## 2016-01-23 DIAGNOSIS — N183 Chronic kidney disease, stage 3 unspecified: Secondary | ICD-10-CM

## 2016-01-23 DIAGNOSIS — Z8673 Personal history of transient ischemic attack (TIA), and cerebral infarction without residual deficits: Secondary | ICD-10-CM | POA: Insufficient documentation

## 2016-01-23 DIAGNOSIS — K7469 Other cirrhosis of liver: Secondary | ICD-10-CM

## 2016-01-23 DIAGNOSIS — K3189 Other diseases of stomach and duodenum: Secondary | ICD-10-CM

## 2016-01-23 DIAGNOSIS — K319 Disease of stomach and duodenum, unspecified: Secondary | ICD-10-CM | POA: Insufficient documentation

## 2016-01-23 DIAGNOSIS — K740 Hepatic fibrosis: Secondary | ICD-10-CM | POA: Insufficient documentation

## 2016-01-23 DIAGNOSIS — Z794 Long term (current) use of insulin: Secondary | ICD-10-CM | POA: Diagnosis not present

## 2016-01-23 DIAGNOSIS — E1122 Type 2 diabetes mellitus with diabetic chronic kidney disease: Secondary | ICD-10-CM | POA: Diagnosis not present

## 2016-01-23 DIAGNOSIS — E785 Hyperlipidemia, unspecified: Secondary | ICD-10-CM | POA: Insufficient documentation

## 2016-01-23 DIAGNOSIS — K31819 Angiodysplasia of stomach and duodenum without bleeding: Secondary | ICD-10-CM | POA: Insufficient documentation

## 2016-01-23 DIAGNOSIS — N400 Enlarged prostate without lower urinary tract symptoms: Secondary | ICD-10-CM | POA: Diagnosis not present

## 2016-01-23 DIAGNOSIS — D638 Anemia in other chronic diseases classified elsewhere: Secondary | ICD-10-CM

## 2016-01-23 DIAGNOSIS — D509 Iron deficiency anemia, unspecified: Secondary | ICD-10-CM | POA: Diagnosis not present

## 2016-01-23 DIAGNOSIS — Z89512 Acquired absence of left leg below knee: Secondary | ICD-10-CM | POA: Diagnosis not present

## 2016-01-23 DIAGNOSIS — E1151 Type 2 diabetes mellitus with diabetic peripheral angiopathy without gangrene: Secondary | ICD-10-CM | POA: Diagnosis not present

## 2016-01-23 DIAGNOSIS — E1159 Type 2 diabetes mellitus with other circulatory complications: Secondary | ICD-10-CM

## 2016-01-23 DIAGNOSIS — I129 Hypertensive chronic kidney disease with stage 1 through stage 4 chronic kidney disease, or unspecified chronic kidney disease: Secondary | ICD-10-CM | POA: Insufficient documentation

## 2016-01-23 HISTORY — DX: Benign prostatic hyperplasia without lower urinary tract symptoms: N40.0

## 2016-01-23 HISTORY — DX: Chronic kidney disease, unspecified: N18.9

## 2016-01-23 HISTORY — PX: ESOPHAGOGASTRODUODENOSCOPY (EGD) WITH PROPOFOL: SHX5813

## 2016-01-23 LAB — GLUCOSE, CAPILLARY: Glucose-Capillary: 215 mg/dL — ABNORMAL HIGH (ref 65–99)

## 2016-01-23 SURGERY — ESOPHAGOGASTRODUODENOSCOPY (EGD) WITH PROPOFOL
Anesthesia: Monitor Anesthesia Care

## 2016-01-23 MED ORDER — LACTATED RINGERS IV SOLN
INTRAVENOUS | Status: DC
  Administered 2016-01-23: 1000 mL via INTRAVENOUS

## 2016-01-23 MED ORDER — ONDANSETRON HCL 4 MG/2ML IJ SOLN
INTRAMUSCULAR | Status: DC | PRN
  Administered 2016-01-23: 4 mg via INTRAVENOUS

## 2016-01-23 MED ORDER — SODIUM CHLORIDE 0.9 % IV SOLN: INTRAVENOUS | Status: DC

## 2016-01-23 MED ORDER — ONDANSETRON HCL 4 MG/2ML IJ SOLN
INTRAMUSCULAR | Status: AC
  Filled 2016-01-23: qty 2

## 2016-01-23 MED ORDER — PROPOFOL 500 MG/50ML IV EMUL
INTRAVENOUS | Status: DC | PRN
  Administered 2016-01-23: 200 ug/kg/min via INTRAVENOUS

## 2016-01-23 MED ORDER — PROPOFOL 10 MG/ML IV BOLUS
INTRAVENOUS | Status: AC
  Filled 2016-01-23: qty 40

## 2016-01-23 MED ORDER — PROPOFOL 10 MG/ML IV BOLUS
INTRAVENOUS | Status: DC | PRN
  Administered 2016-01-23: 30 mg via INTRAVENOUS

## 2016-01-23 SURGICAL SUPPLY — 14 items

## 2016-01-23 NOTE — Transfer of Care (Signed)
Immediate Anesthesia Transfer of Care Note  Patient: Cory Weber  Procedure(s) Performed: Procedure(s): ESOPHAGOGASTRODUODENOSCOPY (EGD) WITH PROPOFOL (N/A)  Patient Location: PACU  Anesthesia Type:MAC  Level of Consciousness:  sedated, patient cooperative and responds to stimulation  Airway & Oxygen Therapy:Patient Spontanous Breathing and Patient connected to face mask oxgen  Post-op Assessment:  Report given to PACU RN and Post -op Vital signs reviewed and stable  Post vital signs:  Reviewed and stable  Last Vitals:  Vitals:   01/23/16 0924  BP: (!) 148/77  Pulse: 92  Resp: 15  Temp: 39.4 C    Complications: No apparent anesthesia complications

## 2016-01-23 NOTE — H&P (Signed)
History:  This patient presents for endoscopic testing for anemia and chronic liver disease.  Cory Weber Referring physician: Maggie Font, MD  Past Medical History: Past Medical History:  Diagnosis Date  . Anemia   . Asthma   . BPH (benign prostatic hyperplasia)   . Cellulitis and abscess of foot 07/2015  . Chronic kidney disease    CKD stage 3  . CVA (cerebral vascular accident) ([place]) 2015   denies residual on 08/15/2015  . Hepatitis C    states he was diagnosed in 2007 or 2007 while living in [place], [place]  . Hyperlipidemia   . Hypertension   . Peripheral neuropathy ([place])   . Pneumonia X 1  . PVD (peripheral vascular disease) ([place])   . Type II diabetes mellitus ([place])      Past Surgical History: Past Surgical History:  Procedure Laterality Date  . AMPUTATION Left 08/16/2015   Procedure: LEFT BELOW THE KNEE AMPUTATION ;  Surgeon: Serafina Mitchell, MD;  Location: [facility];  Service: Vascular;  Laterality: Left;  . BACK SURGERY    . BELOW KNEE LEG AMPUTATION Left 08/16/2015  . BIOPSY N/A 09/03/2012   Procedure: BIOPSY;  Surgeon: Daneil Dolin, MD;  Location: [facility];  Service: Endoscopy;  Laterality: N/A;  gastric and gastric mucosa  . BIOPSY N/A 12/03/2012   Procedure: BIOPSY;  Surgeon: Daneil Dolin, MD;  Location: [facility];  Service: Endoscopy;  Laterality: N/A;  . COLONOSCOPY WITH PROPOFOL N/A 09/03/2012   [id number] polyp-removed as outlined above. Prominent internal hemorrhoids. Tubular adenoma  . ESOPHAGOGASTRODUODENOSCOPY (EGD) WITH PROPOFOL N/A 09/03/2012   NGE:[id number] hernia. Gastric diverticulum. Gastric ulcers with associated erosions. Duodenal erosions. Status post gastric biopsy. H.PYLORI gastritis   . ESOPHAGOGASTRODUODENOSCOPY (EGD) WITH PROPOFOL N/A 12/03/2012   Dr. Gala Romney: gastric diverticulum, gastric erosions and scar. Previously noted gastric ulcer completed healed. Biopsy without H.pylori.   Marland Kitchen LIVER BIOPSY  2005   Done in [place], [place]. Chronic hepatitis  with mild periportal inflammation, lobular unicellular necrosis and portal fibrosis. Grade 2, stage 1-2.  Marland Kitchen MAXIMUM ACCESS (MAS)POSTERIOR LUMBAR INTERBODY FUSION (PLIF) 1 LEVEL N/A 11/25/2013   Procedure: FOR MAXIMUM ACCESS (MAS) POSTERIOR LUMBAR INTERBODY FUSION (PLIF) 1 LEVEL;  Surgeon: Eustace Moore, MD;  Location: [facility];  Service: Neurosurgery;  Laterality: N/A;  FOR MAXIMUM ACCESS (MAS) POSTERIOR LUMBAR INTERBODY FUSION (PLIF) 1 LEVEL LUMBAR 3-4  . PERIPHERAL VASCULAR CATHETERIZATION Left 08/01/2015   Procedure: Lower Extremity Angiography;  Surgeon: Serafina Mitchell, MD;  Location: [facility];  Service: Cardiovascular;  Laterality: Left;  . PERIPHERAL VASCULAR CATHETERIZATION N/A 08/01/2015   Procedure: Abdominal Aortogram;  Surgeon: Serafina Mitchell, MD;  Location: [facility];  Service: Cardiovascular;  Laterality: N/A;  . PERIPHERAL VASCULAR CATHETERIZATION N/A 08/08/2015   Procedure: Abdominal Aortogram w/Lower Extremity;  Surgeon: Serafina Mitchell, MD;  Location: [facility];  Service: Cardiovascular;  Laterality: N/A;  . PERIPHERAL VASCULAR CATHETERIZATION Left 08/08/2015   Procedure: Peripheral Vascular Balloon Angioplasty;  Surgeon: Serafina Mitchell, MD;  Location: [facility];  Service: Cardiovascular;  Laterality: Left;  left popiteal artery, left peronealtrunk, left post tibial  . POLYPECTOMY N/A 09/03/2012   Procedure: POLYPECTOMY;  Surgeon: Daneil Dolin, MD;  Location: [facility];  Service: Endoscopy;  Laterality: N/A;  cecal polyp  . TONSILLECTOMY      Allergies: No Known Allergies  Outpatient Meds: Current Facility-Administered Medications  Medication Dose Route Frequency Provider Last Rate  Last Dose  . 0.9 %  sodium chloride infusion   Intravenous Continuous Nelida Meuse III, MD      . lactated ringers infusion   Intravenous Continuous Nelida Meuse III, MD 50 mL/hr at 01/23/16 0944 1,000 mL at 01/23/16 0944       ___________________________________________________________________ Objective   Exam:  BP (!) 148/77   Pulse 92   Temp 98.9 F (37.2 C) (Oral)   Resp 15   SpO2 97%    CV: RRR without murmur, S1/S2, no JVD, no peripheral edema  Resp: clear to auscultation bilaterally, normal RR and effort noted  GI: obese, soft, no tenderness, with active bowel sounds. Assessment of organomegaly limited by abdominal girth  Neuro: awake, alert and oriented x 3. Normal gross motor function and fluent speech   Assessment:  Anemia Chronic liver disease from prior HCV infection - F3/F4 fibrosis on elastrography  Plan:  EGD   Nelida Meuse III

## 2016-01-23 NOTE — Telephone Encounter (Signed)
Attempted to leave message on voicemail in reference to upcoming appointment scheduled for 01/26/16 but no answer or answer machine. Cory Weber, [name]

## 2016-01-23 NOTE — Anesthesia Preprocedure Evaluation (Signed)
Anesthesia Evaluation  Patient identified by MRN, date of birth, ID band Patient awake    Reviewed: Allergy & Precautions, NPO status , Patient's Chart, lab work & pertinent test results  Airway Mallampati: II  TM Distance: >3 FB Neck ROM: Full    Dental no notable dental hx.    Pulmonary former smoker,    Pulmonary exam normal breath sounds clear to auscultation       Cardiovascular hypertension, Pt. on medications Normal cardiovascular exam Rhythm:Regular Rate:Normal     Neuro/Psych CVA, No Residual Symptoms negative neurological ROS  negative psych ROS   GI/Hepatic negative GI ROS, (+) Hepatitis -, C  Endo/Other  diabetes, Type 2, Insulin Dependent, Oral Hypoglycemic Agents  Renal/GU negative Renal ROS  negative genitourinary   Musculoskeletal negative musculoskeletal ROS (+)   Abdominal   Peds negative pediatric ROS (+)  Hematology negative hematology ROS (+)   Anesthesia Other Findings   Reproductive/Obstetrics negative OB ROS                             Anesthesia Physical Anesthesia Plan  ASA: III  Anesthesia Plan: MAC   Post-op Pain Management:    Induction:   Airway Management Planned: Nasal Cannula  Additional Equipment:   Intra-op Plan:   Post-operative Plan:   Informed Consent: I have reviewed the patients History and Physical, chart, labs and discussed the procedure including the risks, benefits and alternatives for the proposed anesthesia with the patient or authorized representative who has indicated his/her understanding and acceptance.   Dental advisory given  Plan Discussed with: CRNA  Anesthesia Plan Comments:         Anesthesia Quick Evaluation

## 2016-01-23 NOTE — Op Note (Signed)
[facility] Patient Name: Cory Weber Procedure Date: 01/23/2016 MRN: [id number] Attending MD: Estill Cotta. Loletha Carrow , MD Date of Birth: 02-01-1951 CSN: [id number] Age: 65 Admit Type: Outpatient Procedure:                Upper GI endoscopy Indications:              Unexplained iron deficiency anemia, F3/F4 fibrosis                            on elastography Providers:                Estill Cotta. Loletha Carrow, MD, Cleda Daub, RN, William Dalton, Technician Referring MD:              Medicines:                Monitored Anesthesia Care Complications:            No immediate complications. Estimated Blood Loss:     Estimated blood loss: none. Procedure:                Pre-Anesthesia Assessment:                           - Prior to the procedure, a History and Physical                            was performed, and patient medications and                            allergies were reviewed. The patient's tolerance of                            previous anesthesia was also reviewed. The risks                            and benefits of the procedure and the sedation                            options and risks were discussed with the patient.                            All questions were answered, and informed consent                            was obtained. Anticoagulants: The patient has taken                            aspirin. It was decided not to withhold this                            medication prior to the procedure. ASA Grade  Assessment: III - A patient with severe systemic                            disease. After reviewing the risks and benefits,                            the patient was deemed in satisfactory condition to                            undergo the procedure.                           After obtaining informed consent, the endoscope was                            passed under direct vision. Throughout the                            procedure, the patient's blood pressure, pulse, and                            oxygen saturations were monitored continuously. The                            [id number] ([id number]) scope was introduced through the                            mouth, and advanced to the second part of duodenum.                            The upper GI endoscopy was accomplished without                            difficulty. The patient tolerated the procedure                            well. Scope In: Scope Out: Findings:      The esophagus was normal.      A few localized, non-bleeding erosions were found in the gastric body.       There were no stigmata of recent bleeding.      The cardia and gastric fundus were normal on retroflexion.      A single 2 mm angiodysplastic lesion without bleeding was found in the       duodenal bulb. Impression:               - Normal esophagus.                           - Non-bleeding erosive gastropathy.                           - A single non-bleeding angiodysplastic lesion in                            the duodenum.                           -  No specimens collected.                           Years of chronic, normocytic, iron deficiency                            anemia appears on the basis of chronic GI blood                            loss (most likely multiple SB AVMs) and chronic                            disease. Moderate Sedation:      MAC sedation used Recommendation:           - Patient has a contact number available for                            emergencies. The signs and symptoms of potential                            delayed complications were discussed with the                            patient. Return to normal activities tomorrow.                            Written discharge instructions were provided to the                            patient.                           - Resume previous diet.                           -  Continue present medications, including ora iron                            supplement.                           Follow up with primary care to check blood count                            and manage iron supplementation.                           - Perform an abdominal ultrasound to rule out                            ascites due to the patient's reported unexplained                            weight gain. Procedure Code(s):        --- Professional ---  81771, Esophagogastroduodenoscopy, flexible,                            transoral; diagnostic, including collection of                            specimen(s) by brushing or washing, when performed                            (separate procedure) Diagnosis Code(s):        --- Professional ---                           K31.89, Other diseases of stomach and duodenum                           K31.819, Angiodysplasia of stomach and duodenum                            without bleeding                           D50.9, Iron deficiency anemia, unspecified CPT copyright 2016 [employer] Association. All rights reserved. The codes documented in this report are preliminary and upon coder review may  be revised to meet current compliance requirements. Henry L. Loletha Carrow, MD 01/23/2016 10:14:09 AM This report has been signed electronically. Number of Addenda: 0

## 2016-01-23 NOTE — Discharge Instructions (Signed)
YOU HAD AN ENDOSCOPIC PROCEDURE TODAY: Refer to the procedure report and other information in the discharge instructions given to you for any specific questions about what was found during the examination. If this information does not answer your questions, please call [place] office at [phone] to clarify.  ° °YOU SHOULD EXPECT: Some feelings of bloating in the abdomen. Passage of more gas than usual. Walking can help get rid of the air that was put into your GI tract during the procedure and reduce the bloating. If you had a lower endoscopy (such as a colonoscopy or flexible sigmoidoscopy) you may notice spotting of blood in your stool or on the toilet paper. Some abdominal soreness may be present for a day or two, also. ° °DIET: Your first meal following the procedure should be a light meal and then it is ok to progress to your normal diet. A half-sandwich or bowl of soup is an example of a good first meal. Heavy or fried foods are harder to digest and may make you feel nauseous or bloated. Drink plenty of fluids but you should avoid alcoholic beverages for 24 hours. If you had a esophageal dilation, please see attached instructions for diet.   ° °ACTIVITY: Your care partner should take you home directly after the procedure. You should plan to take it easy, moving slowly for the rest of the day. You can resume normal activity the day after the procedure however YOU SHOULD NOT DRIVE, use power tools, machinery or perform tasks that involve climbing or major physical exertion for 24 hours (because of the sedation medicines used during the test).  ° °SYMPTOMS TO REPORT IMMEDIATELY: °A gastroenterologist can be reached at any hour. Please call [phone]  for any of the following symptoms:  °Following lower endoscopy (colonoscopy, flexible sigmoidoscopy) °Excessive amounts of blood in the stool  °Significant tenderness, worsening of abdominal pains  °Swelling of the abdomen that is new, acute  °Fever of 100° or  higher  °Following upper endoscopy (EGD, EUS, ERCP, esophageal dilation) °Vomiting of blood or coffee ground material  °New, significant abdominal pain  °New, significant chest pain or pain under the shoulder blades  °Painful or persistently difficult swallowing  °New shortness of breath  °Black, tarry-looking or red, bloody stools ° °FOLLOW UP:  °If any biopsies were taken you will be contacted by phone or by letter within the next 1-3 weeks. Call [phone]  if you have not heard about the biopsies in 3 weeks.  °Please also call with any specific questions about appointments or follow up tests. ° °

## 2016-01-23 NOTE — Anesthesia Postprocedure Evaluation (Signed)
Anesthesia Post Note  Patient: Cory Weber  Procedure(s) Performed: Procedure(s) (LRB): ESOPHAGOGASTRODUODENOSCOPY (EGD) WITH PROPOFOL (N/A)  Patient location during evaluation: Endoscopy Anesthesia Type: MAC Level of consciousness: awake and alert Pain management: pain level controlled Vital Signs Assessment: post-procedure vital signs reviewed and stable Respiratory status: spontaneous breathing, nonlabored ventilation, respiratory function stable and patient connected to nasal cannula oxygen Cardiovascular status: stable and blood pressure returned to baseline Anesthetic complications: no    Last Vitals:  Vitals:   01/23/16 1030 01/23/16 1040  BP: (!) 146/74 (!) 141/86  Pulse: 93 98  Resp: 15 17  Temp:      Last Pain:  Vitals:   01/23/16 1016  TempSrc: Oral                 Montez Hageman

## 2016-01-23 NOTE — Interval H&P Note (Signed)
History and Physical Interval Note:  01/23/2016 9:53 AM  Cory Weber  has presented today for surgery, with the diagnosis of hepatic cirrhosis  The various methods of treatment have been discussed with the patient and family. After consideration of risks, benefits and other options for treatment, the patient has consented to  Procedure(s): ESOPHAGOGASTRODUODENOSCOPY (EGD) WITH PROPOFOL (N/A) as a surgical intervention .  The patient's history has been reviewed, patient examined, no change in status, stable for surgery.  I have reviewed the patient's chart and labs.  Questions were answered to the patient's satisfaction.     Nelida Meuse III

## 2016-01-24 ENCOUNTER — Non-Acute Institutional Stay (SKILLED_NURSING_FACILITY): Payer: Medicaid Other | Admitting: Adult Health

## 2016-01-24 ENCOUNTER — Telehealth: Payer: Self-pay

## 2016-01-24 ENCOUNTER — Encounter: Payer: Self-pay | Admitting: Adult Health

## 2016-01-24 ENCOUNTER — Other Ambulatory Visit: Payer: Self-pay

## 2016-01-24 DIAGNOSIS — D638 Anemia in other chronic diseases classified elsewhere: Secondary | ICD-10-CM | POA: Diagnosis not present

## 2016-01-24 DIAGNOSIS — I10 Essential (primary) hypertension: Secondary | ICD-10-CM

## 2016-01-24 DIAGNOSIS — N183 Chronic kidney disease, stage 3 unspecified: Secondary | ICD-10-CM

## 2016-01-24 DIAGNOSIS — S88112A Complete traumatic amputation at level between knee and ankle, left lower leg, initial encounter: Secondary | ICD-10-CM

## 2016-01-24 DIAGNOSIS — E1169 Type 2 diabetes mellitus with other specified complication: Secondary | ICD-10-CM | POA: Diagnosis not present

## 2016-01-24 DIAGNOSIS — R600 Localized edema: Secondary | ICD-10-CM

## 2016-01-24 DIAGNOSIS — E1143 Type 2 diabetes mellitus with diabetic autonomic (poly)neuropathy: Secondary | ICD-10-CM

## 2016-01-24 DIAGNOSIS — Z89512 Acquired absence of left leg below knee: Secondary | ICD-10-CM | POA: Diagnosis not present

## 2016-01-24 DIAGNOSIS — B1920 Unspecified viral hepatitis C without hepatic coma: Secondary | ICD-10-CM

## 2016-01-24 DIAGNOSIS — B182 Chronic viral hepatitis C: Secondary | ICD-10-CM

## 2016-01-24 DIAGNOSIS — E785 Hyperlipidemia, unspecified: Secondary | ICD-10-CM

## 2016-01-24 DIAGNOSIS — K219 Gastro-esophageal reflux disease without esophagitis: Secondary | ICD-10-CM | POA: Diagnosis not present

## 2016-01-24 DIAGNOSIS — R635 Abnormal weight gain: Secondary | ICD-10-CM

## 2016-01-24 DIAGNOSIS — E1165 Type 2 diabetes mellitus with hyperglycemia: Secondary | ICD-10-CM | POA: Diagnosis not present

## 2016-01-24 DIAGNOSIS — IMO0002 Reserved for concepts with insufficient information to code with codable children: Secondary | ICD-10-CM

## 2016-01-24 DIAGNOSIS — M10371 Gout due to renal impairment, right ankle and foot: Secondary | ICD-10-CM

## 2016-01-24 LAB — URIC ACID: URIC ACID: 8.3

## 2016-01-24 NOTE — Telephone Encounter (Signed)
Left a voicemail for a return call. [place] has been scheduled for 01-31-2016 @ 730 am at [facility]. NPO for 6 hours prior. Instructions also mailed to pt.

## 2016-01-24 NOTE — Telephone Encounter (Signed)
-----   Message from Nelida Meuse III, MD sent at 01/23/2016 10:14 AM EST ----- Cory Weber,    I did an EGD on this patient today, and he reported a large unexplained abdominal weight gain.  Please order an abdominal ultrasound to rule out ascites. Thanks  HD

## 2016-01-24 NOTE — Progress Notes (Signed)
Patient ID: Cory Weber, male   DOB: 1951-02-08, 65 y.o.   MRN: [id number]   Location:   [facility] Room Number: 223-B Place of Service:  SNF (31)   CODE STATUS: Full Code  No Known Allergies  Chief Complaint  Patient presents with  . Medical Management of Chronic Issues    Follow up    HPI:  He is a long term resident of this facility being seen for the management of his chronic illnesses. His cbg readings remain elevated. There are no reports of missed insulin doses. He tells me that he is feeling good and has no complaints. There are no nursing concerns at this time.    Past Medical History:  Diagnosis Date  . Anemia   . Asthma   . BPH (benign prostatic hyperplasia)   . Cellulitis and abscess of foot 07/2015  . Chronic kidney disease    CKD stage 3  . CVA (cerebral vascular accident) ([facility]) 2015   denies residual on 08/15/2015  . Hepatitis C    states he was diagnosed in 2007 or 2007 while living in [place], [place]  . Hyperlipidemia   . Hypertension   . Peripheral neuropathy ([place])   . Pneumonia X 1  . PVD (peripheral vascular disease) ([facility])   . Type II diabetes mellitus ([facility] [place])     Past Surgical History:  Procedure Laterality Date  . AMPUTATION Left 08/16/2015   Procedure: LEFT BELOW THE KNEE AMPUTATION ;  Surgeon: Serafina Mitchell, MD;  Location: [facility];  Service: Vascular;  Laterality: Left;  . BACK SURGERY    . BELOW KNEE LEG AMPUTATION Left 08/16/2015  . BIOPSY N/A 09/03/2012   Procedure: BIOPSY;  Surgeon: Daneil Dolin, MD;  Location: [facility];  Service: Endoscopy;  Laterality: N/A;  gastric and gastric mucosa  . BIOPSY N/A 12/03/2012   Procedure: BIOPSY;  Surgeon: Daneil Dolin, MD;  [facility];  Service: Endoscopy;  Laterality: N/A;  . COLONOSCOPY WITH PROPOFOL N/A 09/03/2012   [id number] polyp-removed as outlined above. Prominent internal hemorrhoids. Tubular adenoma  . ESOPHAGOGASTRODUODENOSCOPY (EGD) WITH PROPOFOL N/A 09/03/2012   [id number] hernia. Gastric diverticulum. Gastric ulcers with associated erosions. Duodenal erosions. Status post gastric biopsy. H.PYLORI gastritis   . ESOPHAGOGASTRODUODENOSCOPY (EGD) WITH PROPOFOL N/A 12/03/2012   Dr. Gala Romney: gastric diverticulum, gastric erosions and scar. Previously noted gastric ulcer completed healed. Biopsy without H.pylori.   Marland Kitchen LIVER BIOPSY  2005   Done in [place], [place]. Chronic hepatitis with mild periportal inflammation, lobular unicellular necrosis and portal fibrosis. Grade 2, stage 1-2.  Marland Kitchen MAXIMUM ACCESS (MAS)POSTERIOR LUMBAR INTERBODY FUSION (PLIF) 1 LEVEL N/A 11/25/2013   Procedure: FOR MAXIMUM ACCESS (MAS) POSTERIOR LUMBAR INTERBODY FUSION (PLIF) 1 LEVEL;  Surgeon: Eustace Moore, MD;  Location: [facility];  Service: Neurosurgery;  Laterality: N/A;  FOR MAXIMUM ACCESS (MAS) POSTERIOR LUMBAR INTERBODY FUSION (PLIF) 1 LEVEL LUMBAR 3-4  . PERIPHERAL VASCULAR CATHETERIZATION Left 08/01/2015   Procedure: Lower Extremity Angiography;  Surgeon: Serafina Mitchell, MD;  Location: [facility];  Service: Cardiovascular;  Laterality: Left;  . PERIPHERAL VASCULAR CATHETERIZATION N/A 08/01/2015   Procedure: Abdominal Aortogram;  Surgeon: Serafina Mitchell, MD;  Location: [facility];  Service: Cardiovascular;  Laterality: N/A;  . PERIPHERAL VASCULAR CATHETERIZATION N/A 08/08/2015   Procedure: Abdominal Aortogram w/Lower Extremity;  Surgeon: Serafina Mitchell, MD;  Location: [facility];  Service: Cardiovascular;  Laterality: N/A;  . PERIPHERAL VASCULAR CATHETERIZATION Left 08/08/2015  Procedure: Peripheral Vascular Balloon Angioplasty;  Surgeon: Serafina Mitchell, MD;  Location: [facility];  Service: Cardiovascular;  Laterality: Left;  left popiteal artery, left peronealtrunk, left post tibial  . POLYPECTOMY N/A 09/03/2012   Procedure: POLYPECTOMY;  Surgeon: Daneil Dolin, MD;  [facility];  Service: Endoscopy;  Laterality: N/A;  cecal polyp  .  TONSILLECTOMY      Social History   Social History  . Marital status: Divorced    Spouse name: N/A  . Number of children: 1  . Years of education: N/A   Occupational History  . trying to get disability    Social History Main Topics  . Smoking status: Former Smoker    Packs/day: 1.00    Years: 47.00    Types: Cigarettes    Quit date: 07/13/2015  . Smokeless tobacco: Never Used  . Alcohol use No     Comment:  none 08/15/2015 "stopped drinking 3-4 years ago"  . Drug use:     Types: Cocaine     Comment: 08/15/2015 "last used cocaine 3-4 months ago"  . Sexual activity: No   Other Topics Concern  . Not on file   Social History Narrative  . No narrative on file   Family History  Problem Relation Age of Onset  . Breast cancer Mother   . Heart failure Mother   . Diabetes Father   . Hypertension Father   . Heart disease Father   . Hyperlipidemia Father   . Diabetes Sister   . Hypertension Sister   . Breast cancer Sister   . Colon cancer Neg Hx   . Liver disease Neg Hx       VITAL SIGNS BP 140/79   Pulse (!) 109   Temp 97.3 F (36.3 C) (Oral)   Resp 18   Ht 5\' 10"  (1.778 m)   Wt 240 lb (108.9 kg)   SpO2 98%   BMI 34.44 kg/m   Patient's Medications  New Prescriptions   No medications on file  Previous Medications   ACIDOPHILUS (RISAQUAD) CAPS CAPSULE    Take 2 capsules by mouth daily.   ALLOPURINOL (ZYLOPRIM) 100 MG TABLET    Take 100 mg by mouth daily.   ASPIRIN EC 81 MG TABLET    Take 1 tablet (81 mg total) by mouth daily.   CARVEDILOL (COREG) 3.125 MG TABLET    Take 1 tablet (3.125 mg total) by mouth 2 (two) times daily with a meal.   CETIRIZINE (ZYRTEC) 10 MG TABLET    Take 10 mg by mouth daily.   COLCHICINE 0.6 MG TABLET    Take 0.6 mg by mouth 2 (two) times daily. For 7 days   FAMOTIDINE (PEPCID) 20 MG TABLET    Take 1 tablet (20 mg total) by mouth daily.   FERROUS SULFATE 325 (65 FE) MG TABLET    Take 1 tablet (325 mg total) by mouth 3 (three) times  daily with meals.   FOLIC ACID (FOLVITE) 1 MG TABLET    Take 1 tablet (1 mg total) by mouth daily.   GABAPENTIN (NEURONTIN) 600 MG TABLET    Take 600 mg by mouth 3 (three) times daily.   GUAIFENESIN 1200 MG TB12    Take 1 tablet by mouth every 12 (twelve) hours.   HYDROCODONE-ACETAMINOPHEN (NORCO/VICODIN) 5-325 MG TABLET    Take 1-2 tablets by mouth every 4 (four) hours as needed for moderate pain.   INSULIN ASPART (NOVOLOG) 100 UNIT/ML INJECTION  Inject 12 Units into the skin 3 (three) times daily before meals.    INSULIN GLARGINE (LANTUS) 100 UNIT/ML INJECTION    Inject 40 Units into the skin at bedtime.    METFORMIN (GLUCOPHAGE) 500 MG TABLET    Take 500 mg by mouth daily with breakfast.   MULTIPLE VITAMINS-MINERALS (DECUBI-VITE PO)    Take 1 capsule by mouth daily.   MUPIROCIN OINTMENT (BACTROBAN) 2 %    Place 1 application into the nose 2 (two) times daily.   OXYCODONE HCL, ABUSE DETER, (OXAYDO) 5 MG TABA    Take 1-2 tablets by mouth every 4 (four) hours as needed (PAIN).    POLYETHYLENE GLYCOL (MIRALAX / GLYCOLAX) PACKET    Take 17 g by mouth daily.   SENNOSIDES-DOCUSATE SODIUM (SENOKOT-S) 8.6-50 MG TABLET    Take 2 tablets by mouth 2 (two) times daily. May take an additional 2 tablets daily as needed for constipation   SIMVASTATIN (ZOCOR) 20 MG TABLET    Take 20 mg by mouth every evening.    TAMSULOSIN (FLOMAX) 0.4 MG CAPS CAPSULE    Take 1 capsule (0.4 mg total) by mouth daily.   THIAMINE 100 MG TABLET    Take 1 tablet (100 mg total) by mouth daily.   TORSEMIDE (DEMADEX) 20 MG TABLET    Take 20 mg by mouth 2 (two) times daily.    VITAMIN B-12 500 MCG TABLET    Take 1 tablet (500 mcg total) by mouth daily.  Modified Medications   No medications on file  Discontinued Medications   No medications on file     SIGNIFICANT DIAGNOSTIC EXAMS   08-03-15: TEE: - Left ventricle: Septal and apical hypokinesis. The cavity size was mildly dilated. Wall thickness was increased in a pattern of  moderate LVH. Systolic function was mildly reduced. The estimated ejection fraction was in the range of 45% to 50%. Wall motion was normal; there were no regional wall motion abnormalities. Left ventricular diastolic function parameters were normal. - Aortic valve: Severely calcified non coronary cusp. - Mitral valve: Calcified annulus. Mildly thickened leaflets . - Atrial septum: No defect or patent foramen ovale was identified.  09-05-15: chest x-ray: No acute cardiopulmonary disease.  11-03-15: abdominal ultrasound: no acute abnormality  11-25-15: chest x-ray: No acute cardiopulmonary process seen.  11-26-15: ct of chest: 1. Clusters of nodules and "tree-in-bud" opacities in the right greater than left lung apices compatible with bronchiolitis. 2. Aortic valvular calcification associated with aortic stenosis. 3. Aortic atherosclerosis. 4. Moderate coronary artery calcification.      LABS REVIEWED:   08-31-15: wbc 10.7; hgb 8.7; hct 27.5; mcv 86.8; plt 453; glucose 163; bun 32; creat 1.42; k+4.1; na++132; liver normal albumin 2.6 08-26-15: wbc 9.1; hgb 8.9; hct 28.5; mcv 86.6; plt 572; glucose 140; bun 26; creat 1.18; k+ 4.4; na++132 09-01-15: wbc 8.0; hgb 8.7; hct 27.6;mcv 87.9; plt 456; glucose 128; bun 33; creat 1.26; k+ 5.5; na++ 134 09-04-15: glucose 117; bun 24; creat 1.02; k+ 4.7; na++ 134  09-18-15: glucose 166; bun 10.4; creat 0.93; k+ 4.1; na++ 140  11-25-15: wbc 8.9; hgb 6.3; hct 70.6; mcv 76.3; plt 462; glucose 397; bun 14; creat 1.06; k+ 4.1; na++ 129 11-26-15; hgb a1c 7.6; vit B 12: 287; iron 67; tibc 451; ferritin 8; ldh 149  12-27-15: glucose 520; bun 16.1; creat 1.00; k+ 4.6; na++ 134 01-12-16: urine micro-albumin <1.2 01-17-16: uric acid: 8.3    Review of Systems  Constitutional: Negative for malaise/fatigue.  Respiratory:  Negative for cough and shortness of breath.   Cardiovascular: Negative for chest pain, palpitations has right lower extremity edema    Gastrointestinal: Negative for abdominal pain, constipation and heartburn.  Musculoskeletal: Negative for back pain, joint pain and myalgias.  Skin: Negative.   Neurological: Negative for dizziness.  Psychiatric/Behavioral: The patient is not nervous/anxious.     Physical Exam  Constitutional: No distress.  Eyes: Conjunctivae are normal.  Neck: Neck supple. No JVD present. No thyromegaly present.  Cardiovascular: Normal rate, regular rhythm and intact distal pulses.   Respiratory: Effort normal and breath sounds normal. No respiratory distress. He has no wheezes.  GI: Soft. Bowel sounds are normal. He exhibits no distension. There is no tenderness.  Musculoskeletal: 2+ right lower extremity edema   Able to move all extremities  Status post left bka  Lymphadenopathy:    He has no cervical adenopathy.  Neurological: He is alert.  Skin: Skin is warm and dry. He is not diaphoretic.  Psychiatric: He has a normal mood and affect.     ASSESSMENT/ PLAN:  1. Hypertension: will continue coreg 3.125 mg twice daily asa 81 mg daily    2. PUD: will continue pepcid 20 mg daily  3. Chronic hepatitis C: status post Harvoni treatment  is followed by GI: will monitor  4. Diabetes:  hgb a1c 7.6 will continue metformin 500 mg daily will increase lantus to  55 units nightly will change novolog to 25 units after meals.   5. Dyslipidemia: will continue zocor 20 mg daily  6. BPH: will continue flomax daily   7. Constipation: will continue miralax daily and senna s 2 tabs twice daily   8.  PVD: is status post left bka; will continue oxycodone  5 or 10 mg  every 4 hours as needed  9. Phantom limb pain: will continue neurontin 600 mg three times daily   10. CKD stage III: bun 16.1; creat 1.00; will monitor  11.  CVA: neurologically stable will continue asa 81 mg daily   12. Anemia of chronic disease: will continue to monitor hgb 8.4     MD is aware of resident's narcotic use and is in  agreement with current plan of care. We will attempt to wean resident as apropriate   Ok Edwards NP [facility]  Contact [phone] Monday through Friday 8am- 5pm  After hours call [phone]

## 2016-01-25 ENCOUNTER — Encounter (HOSPITAL_COMMUNITY): Payer: Self-pay | Admitting: Gastroenterology

## 2016-01-26 ENCOUNTER — Ambulatory Visit (HOSPITAL_COMMUNITY): Payer: Medicaid Other | Attending: Cardiology

## 2016-01-26 DIAGNOSIS — I2510 Atherosclerotic heart disease of native coronary artery without angina pectoris: Secondary | ICD-10-CM | POA: Diagnosis not present

## 2016-01-26 DIAGNOSIS — I110 Hypertensive heart disease with heart failure: Secondary | ICD-10-CM | POA: Insufficient documentation

## 2016-01-26 DIAGNOSIS — I2584 Coronary atherosclerosis due to calcified coronary lesion: Secondary | ICD-10-CM | POA: Insufficient documentation

## 2016-01-26 DIAGNOSIS — I509 Heart failure, unspecified: Secondary | ICD-10-CM | POA: Diagnosis not present

## 2016-01-26 DIAGNOSIS — Z01818 Encounter for other preprocedural examination: Secondary | ICD-10-CM | POA: Diagnosis not present

## 2016-01-26 DIAGNOSIS — I10 Essential (primary) hypertension: Secondary | ICD-10-CM | POA: Diagnosis not present

## 2016-01-26 DIAGNOSIS — E119 Type 2 diabetes mellitus without complications: Secondary | ICD-10-CM | POA: Insufficient documentation

## 2016-01-26 DIAGNOSIS — Z01810 Encounter for preprocedural cardiovascular examination: Secondary | ICD-10-CM | POA: Diagnosis present

## 2016-01-26 DIAGNOSIS — Z794 Long term (current) use of insulin: Secondary | ICD-10-CM | POA: Insufficient documentation

## 2016-01-26 MED ORDER — TECHNETIUM TC 99M TETROFOSMIN IV KIT
32.3000 | PACK | Freq: Once | INTRAVENOUS | Status: AC | PRN
  Administered 2016-01-26: 32.3 via INTRAVENOUS
  Filled 2016-01-26: qty 33

## 2016-01-26 NOTE — Telephone Encounter (Signed)
Left message to return call 

## 2016-01-30 NOTE — Telephone Encounter (Signed)
Spoke to pt. He currently lives at [facility] and rehab facility. Contacted Andrea at [facility]. All appointment info was given to [facility].

## 2016-01-31 ENCOUNTER — Ambulatory Visit (HOSPITAL_COMMUNITY)
Admission: RE | Admit: 2016-01-31 | Discharge: 2016-01-31 | Disposition: A | Discharge status: Home / Self Care | Payer: Medicaid Other | Source: Ambulatory Visit | Attending: Gastroenterology | Admitting: Gastroenterology

## 2016-01-31 DIAGNOSIS — B1920 Unspecified viral hepatitis C without hepatic coma: Secondary | ICD-10-CM | POA: Diagnosis present

## 2016-01-31 DIAGNOSIS — R635 Abnormal weight gain: Secondary | ICD-10-CM

## 2016-02-01 ENCOUNTER — Ambulatory Visit (HOSPITAL_COMMUNITY): Payer: Medicaid Other

## 2016-02-05 DIAGNOSIS — M1A3710 Chronic gout due to renal impairment, right ankle and foot, without tophus (tophi): Secondary | ICD-10-CM | POA: Insufficient documentation

## 2016-02-06 ENCOUNTER — Telehealth (HOSPITAL_COMMUNITY): Payer: Self-pay | Admitting: *Deleted

## 2016-02-06 NOTE — Telephone Encounter (Signed)
Left message on voicemail in reference to upcoming appointment scheduled for 02/09/16. Phone number given for a call back so details instructions can be given. Raelie Lohr, [name]

## 2016-02-09 ENCOUNTER — Ambulatory Visit (HOSPITAL_COMMUNITY): Payer: Medicaid Other | Attending: Cardiovascular Disease

## 2016-02-09 DIAGNOSIS — Z01818 Encounter for other preprocedural examination: Secondary | ICD-10-CM | POA: Insufficient documentation

## 2016-02-09 DIAGNOSIS — I10 Essential (primary) hypertension: Secondary | ICD-10-CM | POA: Diagnosis present

## 2016-02-09 LAB — MYOCARDIAL PERFUSION IMAGING
LHR: 0.42
LVDIAVOL: 166 mL (ref 62–150)
LVSYSVOL: 98 mL
NUC STRESS TID: 0.95
Peak HR: 114 {beats}/min
Rest HR: 93 {beats}/min
SDS: 0
SRS: 4
SSS: 0

## 2016-02-09 MED ORDER — REGADENOSON 0.4 MG/5ML IV SOLN
0.4000 mg | Freq: Once | INTRAVENOUS | Status: AC
  Administered 2016-02-09: 0.4 mg via INTRAVENOUS

## 2016-02-09 MED ORDER — TECHNETIUM TC 99M TETROFOSMIN IV KIT
32.6000 | PACK | Freq: Once | INTRAVENOUS | Status: AC | PRN
  Administered 2016-02-09: 32.6 via INTRAVENOUS
  Filled 2016-02-09: qty 33

## 2016-02-13 ENCOUNTER — Telehealth: Payer: Self-pay | Admitting: *Deleted

## 2016-02-13 DIAGNOSIS — R931 Abnormal findings on diagnostic imaging of heart and coronary circulation: Secondary | ICD-10-CM

## 2016-02-13 NOTE — Telephone Encounter (Signed)
-----   Message from Isaiah Serge, NP sent at 02/10/2016 10:57 PM EST ----- Have pt get Echo in [place] and follow up as planned with Dr. Marny Lowenstein.  nuc study did not show any lack of blood supply just lower pump action than on previous echo.

## 2016-02-13 NOTE — Telephone Encounter (Signed)
Pt aware of the stress test results. Order for echo put in [facility] and pt is aware someone from our scheduling dept will contact him to set this up before his f/u appt with Dr. Jacinta Shoe 02/29/16.

## 2016-02-15 ENCOUNTER — Other Ambulatory Visit: Payer: Self-pay

## 2016-02-15 ENCOUNTER — Other Ambulatory Visit (HOSPITAL_COMMUNITY): Payer: Medicaid Other

## 2016-02-15 ENCOUNTER — Ambulatory Visit (HOSPITAL_COMMUNITY): Payer: Medicaid Other | Attending: Cardiovascular Disease

## 2016-02-15 DIAGNOSIS — R931 Abnormal findings on diagnostic imaging of heart and coronary circulation: Secondary | ICD-10-CM | POA: Diagnosis not present

## 2016-02-15 DIAGNOSIS — I071 Rheumatic tricuspid insufficiency: Secondary | ICD-10-CM | POA: Insufficient documentation

## 2016-02-16 ENCOUNTER — Other Ambulatory Visit (HOSPITAL_COMMUNITY): Payer: Medicaid Other

## 2016-02-27 LAB — CBC AND DIFFERENTIAL
HCT: 28 % — AB (ref 41–53)
Hemoglobin: 8.4 g/dL — AB (ref 13.5–17.5)
Platelets: 463 10*3/uL — AB (ref 150–399)
WBC: 8.5 10^3/mL

## 2016-02-29 ENCOUNTER — Ambulatory Visit: Payer: Medicaid Other | Admitting: Cardiovascular Disease

## 2016-03-04 ENCOUNTER — Encounter: Payer: Medicaid Other | Attending: Physical Medicine & Rehabilitation | Admitting: Physical Medicine & Rehabilitation

## 2016-03-04 ENCOUNTER — Encounter: Payer: Self-pay | Admitting: Physical Medicine & Rehabilitation

## 2016-03-04 VITALS — BP 150/78 | HR 107 | Resp 14

## 2016-03-04 DIAGNOSIS — E1122 Type 2 diabetes mellitus with diabetic chronic kidney disease: Secondary | ICD-10-CM | POA: Diagnosis not present

## 2016-03-04 DIAGNOSIS — E1165 Type 2 diabetes mellitus with hyperglycemia: Secondary | ICD-10-CM

## 2016-03-04 DIAGNOSIS — IMO0002 Reserved for concepts with insufficient information to code with codable children: Secondary | ICD-10-CM

## 2016-03-04 DIAGNOSIS — G546 Phantom limb syndrome with pain: Secondary | ICD-10-CM

## 2016-03-04 DIAGNOSIS — Z87891 Personal history of nicotine dependence: Secondary | ICD-10-CM | POA: Diagnosis not present

## 2016-03-04 DIAGNOSIS — E1140 Type 2 diabetes mellitus with diabetic neuropathy, unspecified: Secondary | ICD-10-CM | POA: Diagnosis not present

## 2016-03-04 DIAGNOSIS — E1143 Type 2 diabetes mellitus with diabetic autonomic (poly)neuropathy: Secondary | ICD-10-CM

## 2016-03-04 DIAGNOSIS — Z89512 Acquired absence of left leg below knee: Secondary | ICD-10-CM

## 2016-03-04 DIAGNOSIS — B1920 Unspecified viral hepatitis C without hepatic coma: Secondary | ICD-10-CM | POA: Insufficient documentation

## 2016-03-04 DIAGNOSIS — N183 Chronic kidney disease, stage 3 unspecified: Secondary | ICD-10-CM

## 2016-03-04 DIAGNOSIS — R52 Pain, unspecified: Secondary | ICD-10-CM | POA: Diagnosis present

## 2016-03-04 DIAGNOSIS — Z5189 Encounter for other specified aftercare: Secondary | ICD-10-CM | POA: Diagnosis present

## 2016-03-04 DIAGNOSIS — I129 Hypertensive chronic kidney disease with stage 1 through stage 4 chronic kidney disease, or unspecified chronic kidney disease: Secondary | ICD-10-CM | POA: Insufficient documentation

## 2016-03-04 DIAGNOSIS — S88112A Complete traumatic amputation at level between knee and ankle, left lower leg, initial encounter: Secondary | ICD-10-CM

## 2016-03-04 DIAGNOSIS — N189 Chronic kidney disease, unspecified: Secondary | ICD-10-CM | POA: Diagnosis not present

## 2016-03-04 NOTE — Progress Notes (Signed)
Subjective:    Patient ID: Cory Weber, male    DOB: Dec 18, 1950, 66 y.o.   MRN: [id number]  HPI   Cory Weber is here in follow up of his left BKA and associated pain. He is having a continuous cramping pain in his right foot (toes) and on the left leg at the ends of his stump. The 10mg  oxycodone helps but he needs it every 4 hours. He's also on gabapentin 600mg  TID for neuropathic/phantom limb pain. Hydrocodone is on his MAR at [facility] but he doesn't use them because "they don't work."   He tells me his plan is to get home over the next month or so. His home is [place]. PT has been working on his gait but apparently "took a break" for a period of time.      Pain Inventory Average Pain 9 Pain Right Now 9 My pain is intermittent, constant, sharp, burning, stabbing and tight, clenching, phantom  In the last 24 hours, has pain interfered with the following? General activity 0 Relation with others 0 Enjoyment of life 0 What TIME of day is your pain at its worst? morning Sleep (in general) Fair  Pain is worse with: walking, bending, sitting, inactivity, standing and some activites Pain improves with: medication Relief from Meds: 5  Mobility use a wheelchair  Function disabled: date disabled .  Neuro/Psych numbness  Prior Studies Any changes since last visit?  no  Physicians involved in your care Any changes since last visit?  no   Family History  Problem Relation Age of Onset  . Breast cancer Mother   . Heart failure Mother   . Diabetes Father   . Hypertension Father   . Heart disease Father   . Hyperlipidemia Father   . Diabetes Sister   . Hypertension Sister   . Breast cancer Sister   . Colon cancer Neg Hx   . Liver disease Neg Hx    Social History   Social History  . Marital status: Divorced    Spouse name: N/A  . Number of children: 1  . Years of education: N/A   Occupational History  . trying to get disability    Social History Main  Topics  . Smoking status: Former Smoker    Packs/day: 1.00    Years: 47.00    Types: Cigarettes    Quit date: 07/13/2015  . Smokeless tobacco: Never Used  . Alcohol use No     Comment:  none 08/15/2015 "stopped drinking 3-4 years ago"  . Drug use: Yes    Types: Cocaine     Comment: 08/15/2015 "last used cocaine 3-4 months ago"  . Sexual activity: No   Other Topics Concern  . None   Social History Narrative  . None   Past Surgical History:  Procedure Laterality Date  . AMPUTATION Left 08/16/2015   Procedure: LEFT BELOW THE KNEE AMPUTATION ;  Surgeon: Serafina Mitchell, MD;  Location: [facility];  Service: Vascular;  Laterality: Left;  . BACK SURGERY    . BELOW KNEE LEG AMPUTATION Left 08/16/2015  . BIOPSY N/A 09/03/2012   Procedure: BIOPSY;  Surgeon: Daneil Dolin, MD;  [facility];  Service: Endoscopy;  Laterality: N/A;  gastric and gastric mucosa  . BIOPSY N/A 12/03/2012   Procedure: BIOPSY;  Surgeon: Daneil Dolin, MD;  [facility];  Service: Endoscopy;  Laterality: N/A;  . COLONOSCOPY WITH PROPOFOL N/A 09/03/2012   [id number] polyp-removed as outlined above. Prominent internal  hemorrhoids. Tubular adenoma  . ESOPHAGOGASTRODUODENOSCOPY (EGD) WITH PROPOFOL N/A 09/03/2012   [id number]:[id number] hernia. Gastric diverticulum. Gastric ulcers with associated erosions. Duodenal erosions. Status post gastric biopsy. H.PYLORI gastritis   . ESOPHAGOGASTRODUODENOSCOPY (EGD) WITH PROPOFOL N/A 12/03/2012   Dr. Gala Romney: gastric diverticulum, gastric erosions and scar. Previously noted gastric ulcer completed healed. Biopsy without H.pylori.   . ESOPHAGOGASTRODUODENOSCOPY (EGD) WITH PROPOFOL N/A 01/23/2016   Procedure: ESOPHAGOGASTRODUODENOSCOPY (EGD) WITH PROPOFOL;  Surgeon: Doran Stabler, MD;  [facility];  Service: Gastroenterology;  Laterality: N/A;  . LIVER BIOPSY  2005   Done in [place], [place]. Chronic hepatitis with mild periportal inflammation, lobular unicellular necrosis and portal  fibrosis. Grade 2, stage 1-2.  Marland Kitchen MAXIMUM ACCESS (MAS)POSTERIOR LUMBAR INTERBODY FUSION (PLIF) 1 LEVEL N/A 11/25/2013   Procedure: FOR MAXIMUM ACCESS (MAS) POSTERIOR LUMBAR INTERBODY FUSION (PLIF) 1 LEVEL;  Surgeon: Eustace Moore, MD;  Location: [facility];  Service: Neurosurgery;  Laterality: N/A;  FOR MAXIMUM ACCESS (MAS) POSTERIOR LUMBAR INTERBODY FUSION (PLIF) 1 LEVEL LUMBAR 3-4  . PERIPHERAL VASCULAR CATHETERIZATION Left 08/01/2015   Procedure: Lower Extremity Angiography;  Surgeon: Serafina Mitchell, MD;  Location: [facility];  Service: Cardiovascular;  Laterality: Left;  . PERIPHERAL VASCULAR CATHETERIZATION N/A 08/01/2015   Procedure: Abdominal Aortogram;  Surgeon: Serafina Mitchell, MD;  Location: [place] [facility];  Service: Cardiovascular;  Laterality: N/A;  . PERIPHERAL VASCULAR CATHETERIZATION N/A 08/08/2015   Procedure: Abdominal Aortogram w/Lower Extremity;  Surgeon: Serafina Mitchell, MD;  Location: [place] [facility];  Service: Cardiovascular;  Laterality: N/A;  . PERIPHERAL VASCULAR CATHETERIZATION Left 08/08/2015   Procedure: Peripheral Vascular Balloon Angioplasty;  Surgeon: Serafina Mitchell, MD;  Location: [facility];  Service: Cardiovascular;  Laterality: Left;  left popiteal artery, left peronealtrunk, left post tibial  . POLYPECTOMY N/A 09/03/2012   Procedure: POLYPECTOMY;  Surgeon: Daneil Dolin, MD;  [facility];  Service: Endoscopy;  Laterality: N/A;  cecal polyp  . TONSILLECTOMY     Past Medical History:  Diagnosis Date  . Anemia   . Asthma   . BPH (benign prostatic hyperplasia)   . Cellulitis and abscess of foot 07/2015  . Chronic kidney disease    CKD stage 3  . CVA (cerebral vascular accident) ([place]) 2015   denies residual on 08/15/2015  . Hepatitis C    states he was diagnosed in 2007 or 2007 while living in [place], [place]  . Hyperlipidemia   . Hypertension   . Peripheral neuropathy ([place])   . Pneumonia X 1  . PVD (peripheral vascular disease)  ([facility])   . Type II diabetes mellitus ([facility])    BP (!) 150/78   Pulse (!) 107   Resp 14   SpO2 90%   Opioid Risk Score:   Fall Risk Score:  `1  Depression screen PHQ 2/9  Depression screen PHQ 2/9 02/22/2015  Decreased Interest 0  Down, Depressed, Hopeless 0  PHQ - 2 Score 0    Review of Systems  Constitutional: Negative.   HENT: Negative.   Eyes: Negative.   Respiratory: Negative.   Cardiovascular: Negative.   Gastrointestinal: Negative.   Endocrine: Negative.   Genitourinary: Negative.   Musculoskeletal: Negative.   Skin: Negative.   Allergic/Immunologic: Negative.   Neurological: Positive for numbness.       Phantom pain  Hematological: Negative.   Psychiatric/Behavioral: Negative.   All other systems reviewed and are negative.      Objective:  Physical Exam Constitutional: He appears well-developed and well-nourished.  HENT: Normocephalic and atraumatic.  Cardiovascular: RRR Respiratory: CTA B GI: Soft. Bowel sounds are normal. He exhibits no distension.  Musculoskeletal: He exhibits Mild edema, no tenderness RLE. Stump well-shaped and formed.   Neurological: He is alert and oriented.  Motor: B/l UE, RLE: 5/5 proximal to distal LLE: hip flexion 5/5 KE 4/5.  Skin: Skin is warm. Right foot dry and flaky.  Amputation site well healed. Tender to touch. Well formed but still edematous  Psychiatric: He has a normal mood and affect. His behavior is normal.   Assessment & Plan:  [name] with history of HTN, T2DM, tobacco abuse, hepatitis C, CKD,PVD,  S/p left BKA.   1. S/p left BKA 08/16/2015 after recent left popliteal angioplasty. -plan to return home soon.              -continue to work on gait with therapy.   -should be wearing leg during the day as much as possible for now  2. Pain Management with phantom limb pain -continue with  Neurontin to 600 TID  -oxycodone 10mg  q4-6 hr  PRN for breakthrough pain,  wean as able  -recommend addition of MS Contin 15mg  q12 hr scheduled  -stop hydrocodone  -add robaxin 500mg  q6 prn for muscle spasms             -encouraged more use of his prosthesis. Needs to use stump shrinker when not wearing prosthesis             -needs to be aggressive with massage/visual feedback of left stump  3. Diabetes mellitus with peripheral neuropathy. -per primary   4. Hypertension Cont meds   6. Hepatitis C Avoid hepatotoxic meds.     Follow up with me 2 months. Thirty minutes of face to face patient care time were spent during this visit. All questions were encouraged and answered.

## 2016-03-04 NOTE — Patient Instructions (Addendum)
[name] with history of HTN, T2DM, tobacco abuse, hepatitis C, CKD, s/p left BKA.   1. S/p left BKA 08/16/2015 after recent left popliteal angioplasty. -plan to return home soon?.              -continue to work on prosthetic gait with physical therapy.   -should be wearing leg during the day as much as possible for now  2. Persistent stump and phantom limb pain -continue with  Neurontin to 600 TID  -oxycodone 10mg  q4-6 hr  PRN for breakthrough pain--decrease frequency as possible  -recommend addition of MS Contin 15mg  V37 hr scheduled  -stop hydrocodone  -add robaxin 500mg  q6 prn             -encouraged more use of his prosthesis. Needs to use stump shrinker when not wearing prosthesis             -needs to be aggressive with massage/visual feedback of left stump   Follow up with me in 2 months.

## 2016-03-20 ENCOUNTER — Non-Acute Institutional Stay (SKILLED_NURSING_FACILITY): Payer: Medicaid Other | Admitting: Adult Health

## 2016-03-20 ENCOUNTER — Encounter: Payer: Self-pay | Admitting: Adult Health

## 2016-03-20 DIAGNOSIS — I739 Peripheral vascular disease, unspecified: Secondary | ICD-10-CM

## 2016-03-20 DIAGNOSIS — S88112A Complete traumatic amputation at level between knee and ankle, left lower leg, initial encounter: Secondary | ICD-10-CM

## 2016-03-20 DIAGNOSIS — E1143 Type 2 diabetes mellitus with diabetic autonomic (poly)neuropathy: Secondary | ICD-10-CM | POA: Diagnosis not present

## 2016-03-20 DIAGNOSIS — G546 Phantom limb syndrome with pain: Secondary | ICD-10-CM

## 2016-03-20 DIAGNOSIS — E1169 Type 2 diabetes mellitus with other specified complication: Secondary | ICD-10-CM | POA: Diagnosis not present

## 2016-03-20 DIAGNOSIS — Z981 Arthrodesis status: Secondary | ICD-10-CM

## 2016-03-20 DIAGNOSIS — IMO0002 Reserved for concepts with insufficient information to code with codable children: Secondary | ICD-10-CM

## 2016-03-20 DIAGNOSIS — B182 Chronic viral hepatitis C: Secondary | ICD-10-CM

## 2016-03-20 DIAGNOSIS — Z89512 Acquired absence of left leg below knee: Secondary | ICD-10-CM

## 2016-03-20 DIAGNOSIS — N183 Chronic kidney disease, stage 3 unspecified: Secondary | ICD-10-CM

## 2016-03-20 DIAGNOSIS — I10 Essential (primary) hypertension: Secondary | ICD-10-CM

## 2016-03-20 DIAGNOSIS — E785 Hyperlipidemia, unspecified: Secondary | ICD-10-CM

## 2016-03-20 DIAGNOSIS — E1165 Type 2 diabetes mellitus with hyperglycemia: Secondary | ICD-10-CM | POA: Diagnosis not present

## 2016-03-20 DIAGNOSIS — M1A3710 Chronic gout due to renal impairment, right ankle and foot, without tophus (tophi): Secondary | ICD-10-CM | POA: Diagnosis not present

## 2016-03-20 NOTE — Progress Notes (Signed)
Location:  [facility] Room Number: 625 W LSLHT of Service:  SNF (31)   CODE STATUS: Full Code  No Known Allergies  Chief Complaint  Patient presents with  . Medical Management of Chronic Issues    Routine Visit    HPI:  He is a long term resident of this facility being seen for the management of his chronic illnesses. His cbg's a fluctuating between highs and lows. His uric acid has been elevated and will need to have his allopurinol dose increased. He is not voicing any complaints today. There are no nursing concerns at this time.   Past Medical History:  Diagnosis Date  . Anemia   . Asthma   . BPH (benign prostatic hyperplasia)   . Cellulitis and abscess of foot 07/2015  . Chronic kidney disease    CKD stage 3  . CVA (cerebral vascular accident) ([place]) 2015   denies residual on 08/15/2015  . Hepatitis C    states he was diagnosed in 2007 or 2007 while living in [place], [place]  . Hyperlipidemia   . Hypertension   . Peripheral neuropathy ([place])   . Pneumonia X 1  . PVD (peripheral vascular disease) ([place])   . Type II diabetes mellitus ([place])     Past Surgical History:  Procedure Laterality Date  . AMPUTATION Left 08/16/2015   Procedure: LEFT BELOW THE KNEE AMPUTATION ;  Surgeon: Serafina Mitchell, MD;  Location: [facility];  Service: Vascular;  Laterality: Left;  . BACK SURGERY    . BELOW KNEE LEG AMPUTATION Left 08/16/2015  . BIOPSY N/A 09/03/2012   Procedure: BIOPSY;  Surgeon: Daneil Dolin, MD;  Location: [facility];  Service: Endoscopy;  Laterality: N/A;  gastric and gastric mucosa  . BIOPSY N/A 12/03/2012   Procedure: BIOPSY;  Surgeon: Daneil Dolin, MD;  Location: [facility];  Service: Endoscopy;  Laterality: N/A;  . COLONOSCOPY WITH PROPOFOL N/A 09/03/2012   DSK:AJGOTLX polyp-removed as outlined above. Prominent internal hemorrhoids. Tubular adenoma  . ESOPHAGOGASTRODUODENOSCOPY (EGD) WITH PROPOFOL N/A 09/03/2012   BWI:OMBTDH hernia. Gastric diverticulum. Gastric  ulcers with associated erosions. Duodenal erosions. Status post gastric biopsy. H.PYLORI gastritis   . ESOPHAGOGASTRODUODENOSCOPY (EGD) WITH PROPOFOL N/A 12/03/2012   Dr. Gala Romney: gastric diverticulum, gastric erosions and scar. Previously noted gastric ulcer completed healed. Biopsy without H.pylori.   . ESOPHAGOGASTRODUODENOSCOPY (EGD) WITH PROPOFOL N/A 01/23/2016   Procedure: ESOPHAGOGASTRODUODENOSCOPY (EGD) WITH PROPOFOL;  Surgeon: Doran Stabler, MD;  Location: [facility];  Service: Gastroenterology;  Laterality: N/A;  . LIVER BIOPSY  2005   Done in [place], [place]. Chronic hepatitis with mild periportal inflammation, lobular unicellular necrosis and portal fibrosis. Grade 2, stage 1-2.  Marland Kitchen MAXIMUM ACCESS (MAS)POSTERIOR LUMBAR INTERBODY FUSION (PLIF) 1 LEVEL N/A 11/25/2013   Procedure: FOR MAXIMUM ACCESS (MAS) POSTERIOR LUMBAR INTERBODY FUSION (PLIF) 1 LEVEL;  Surgeon: Eustace Moore, MD;  Location: [facility];  Service: Neurosurgery;  Laterality: N/A;  FOR MAXIMUM ACCESS (MAS) POSTERIOR LUMBAR INTERBODY FUSION (PLIF) 1 LEVEL LUMBAR 3-4  . PERIPHERAL VASCULAR CATHETERIZATION Left 08/01/2015   Procedure: Lower Extremity Angiography;  Surgeon: Serafina Mitchell, MD;  Location: [facility];  Service: Cardiovascular;  Laterality: Left;  . PERIPHERAL VASCULAR CATHETERIZATION N/A 08/01/2015   Procedure: Abdominal Aortogram;  Surgeon: Serafina Mitchell, MD;  Location: [facility];  Service: Cardiovascular;  Laterality: N/A;  . PERIPHERAL VASCULAR CATHETERIZATION N/A 08/08/2015   Procedure: Abdominal Aortogram w/Lower Extremity;  Surgeon: Serafina Mitchell, MD;  Location: [facility];  Service: Cardiovascular;  Laterality: N/A;  . PERIPHERAL VASCULAR CATHETERIZATION Left 08/08/2015   Procedure: Peripheral Vascular Balloon Angioplasty;  Surgeon: Serafina Mitchell, MD;  Location: [facility];  Service: Cardiovascular;  Laterality: Left;  left popiteal artery, left peronealtrunk, left  post tibial  . POLYPECTOMY N/A 09/03/2012   Procedure: POLYPECTOMY;  Surgeon: Daneil Dolin, MD;  Location: [facility];  Service: Endoscopy;  Laterality: N/A;  cecal polyp  . TONSILLECTOMY      Social History   Social History  . Marital status: Divorced    Spouse name: N/A  . Number of children: 1  . Years of education: N/A   Occupational History  . trying to get disability    Social History Main Topics  . Smoking status: Former Smoker    Packs/day: 1.00    Years: 47.00    Types: Cigarettes    Quit date: 07/13/2015  . Smokeless tobacco: Never Used  . Alcohol use No     Comment:  none 08/15/2015 "stopped drinking 3-4 years ago"  . Drug use: Yes    Types: Cocaine     Comment: 08/15/2015 "last used cocaine 3-4 months ago"  . Sexual activity: No   Other Topics Concern  . Not on file   Social History Narrative  . No narrative on file   Family History  Problem Relation Age of Onset  . Breast cancer Mother   . Heart failure Mother   . Diabetes Father   . Hypertension Father   . Heart disease Father   . Hyperlipidemia Father   . Diabetes Sister   . Hypertension Sister   . Breast cancer Sister   . Colon cancer Neg Hx   . Liver disease Neg Hx       VITAL SIGNS BP 118/88   Pulse 84   Temp 98.9 F (37.2 C)   Resp 18   Ht 5\' 10"  (1.778 m)   Wt 253 lb (114.8 kg)   SpO2 99%   BMI 36.30 kg/m   Patient's Medications  New Prescriptions   No medications on file  Previous Medications   ACIDOPHILUS (RISAQUAD) CAPS CAPSULE    Take 2 capsules by mouth daily.   ALLOPURINOL (ZYLOPRIM) 100 MG TABLET    Take 100 mg by mouth daily.   ASPIRIN EC 81 MG TABLET    Take 1 tablet (81 mg total) by mouth daily.   CARVEDILOL (COREG) 3.125 MG TABLET    Take 1 tablet (3.125 mg total) by mouth 2 (two) times daily with a meal.   CETIRIZINE (ZYRTEC) 10 MG TABLET    Take 10 mg by mouth daily.   FAMOTIDINE (PEPCID) 20 MG TABLET    Take 1 tablet (20 mg total) by mouth daily.   FERROUS SULFATE  325 (65 FE) MG TABLET    Take 1 tablet (325 mg total) by mouth 3 (three) times daily with meals.   FOLIC ACID (FOLVITE) 1 MG TABLET    Take 1 tablet (1 mg total) by mouth daily.   GABAPENTIN (NEURONTIN) 600 MG TABLET    Take 600 mg by mouth 3 (three) times daily.   GUAIFENESIN 1200 MG TB12    Take 1 tablet by mouth every 12 (twelve) hours.   INSULIN ASPART (NOVOLOG) 100 UNIT/ML INJECTION    Inject 25 Units into the skin 3 (three) times daily before meals.    INSULIN GLARGINE (LANTUS) 100 UNIT/ML INJECTION    Inject  55 Units into the skin at bedtime.    METFORMIN (GLUCOPHAGE) 500 MG TABLET    Take 500 mg by mouth daily with breakfast.   METHOCARBAMOL (ROBAXIN) 500 MG TABLET    Take 500 mg by mouth every 6 (six) hours.   MORPHINE (MS CONTIN) 15 MG 12 HR TABLET    Take 15 mg by mouth every 12 (twelve) hours.   MULTIPLE VITAMINS-MINERALS (DECUBI-VITE PO)    Take 1 capsule by mouth daily.   OXYCODONE HCL, ABUSE DETER, (OXAYDO) 5 MG TABA    Take 1 tablet by mouth every 4 (four) hours as needed (PAIN).    POLYETHYLENE GLYCOL (MIRALAX / GLYCOLAX) PACKET    Take 17 g by mouth daily.   SENNOSIDES-DOCUSATE SODIUM (SENOKOT-S) 8.6-50 MG TABLET    Take 2 tablets by mouth 2 (two) times daily. May take an additional 2 tablets daily as needed for constipation   SIMVASTATIN (ZOCOR) 20 MG TABLET    Take 20 mg by mouth every evening.    TAMSULOSIN (FLOMAX) 0.4 MG CAPS CAPSULE    Take 1 capsule (0.4 mg total) by mouth daily.   THIAMINE 100 MG TABLET    Take 1 tablet (100 mg total) by mouth daily.   TORSEMIDE (DEMADEX) 20 MG TABLET    Take 20 mg by mouth 2 (two) times daily.    VITAMIN B-12 500 MCG TABLET    Take 1 tablet (500 mcg total) by mouth daily.  Modified Medications   No medications on file  Discontinued Medications   COLCHICINE 0.6 MG TABLET    Take 0.6 mg by mouth 2 (two) times daily. For 7 days   HYDROCODONE-ACETAMINOPHEN (NORCO/VICODIN) 5-325 MG TABLET    Take 1-2 tablets by mouth every 4 (four) hours  as needed for moderate pain.   MUPIROCIN OINTMENT (BACTROBAN) 2 %    Place 1 application into the nose 2 (two) times daily.     SIGNIFICANT DIAGNOSTIC EXAMS   08-03-15: TEE: - Left ventricle: Septal and apical hypokinesis. The cavity size was mildly dilated. Wall thickness was increased in a pattern of moderate LVH. Systolic function was mildly reduced. The estimated ejection fraction was in the range of 45% to 50%. Wall motion was normal; there were no regional wall motion abnormalities. Left ventricular diastolic function parameters were normal. - Aortic valve: Severely calcified non coronary cusp. - Mitral valve: Calcified annulus. Mildly thickened leaflets . - Atrial septum: No defect or patent foramen ovale was identified.  09-05-15: chest x-ray: No acute cardiopulmonary disease.  11-03-15: abdominal ultrasound: no acute abnormality  11-25-15: chest x-ray: No acute cardiopulmonary process seen.  11-26-15: ct of chest: 1. Clusters of nodules and "tree-in-bud" opacities in the right greater than left lung apices compatible with bronchiolitis. 2. Aortic valvular calcification associated with aortic stenosis. 3. Aortic atherosclerosis. 4. Moderate coronary artery calcification.      LABS REVIEWED:   08-31-15: wbc 10.7; hgb 8.7; hct 27.5; mcv 86.8; plt 453; glucose 163; bun 32; creat 1.42; k+4.1; na++132; liver normal albumin 2.6 08-26-15: wbc 9.1; hgb 8.9; hct 28.5; mcv 86.6; plt 572; glucose 140; bun 26; creat 1.18; k+ 4.4; na++132 09-01-15: wbc 8.0; hgb 8.7; hct 27.6;mcv 87.9; plt 456; glucose 128; bun 33; creat 1.26; k+ 5.5; na++ 134 09-04-15: glucose 117; bun 24; creat 1.02; k+ 4.7; na++ 134  09-18-15: glucose 166; bun 10.4; creat 0.93; k+ 4.1; na++ 140  11-25-15: wbc 8.9; hgb 6.3; hct 70.6; mcv 76.3; plt 462; glucose 397; bun 14;  creat 1.06; k+ 4.1; na++ 129 11-26-15; hgb a1c 7.6; vit B 12: 287; iron 67; tibc 451; ferritin 8; ldh 149  12-27-15: glucose 520; bun 16.1; creat 1.00; k+  4.6; na++ 134 01-12-16: urine micro-albumin <1.2 01-17-16: uric acid: 8.3    Review of Systems  Constitutional: Negative for malaise/fatigue.  Respiratory: Negative for cough and shortness of breath.   Cardiovascular: Negative for chest pain, palpitations has right lower extremity edema  Gastrointestinal: Negative for abdominal pain, constipation and heartburn.  Musculoskeletal: Negative for back pain, joint pain and myalgias.  Skin: Negative.   Neurological: Negative for dizziness.  Psychiatric/Behavioral: The patient is not nervous/anxious.     Physical Exam  Constitutional: No distress.  Eyes: Conjunctivae are normal.  Neck: Neck supple. No JVD present. No thyromegaly present.  Cardiovascular: Normal rate, regular rhythm and intact distal pulses.   Respiratory: Effort normal and breath sounds normal. No respiratory distress. He has no wheezes.  GI: Soft. Bowel sounds are normal. He exhibits no distension. There is no tenderness.  Musculoskeletal: 2+ right lower extremity edema   Able to move all extremities  Status post left bka  Lymphadenopathy:    He has no cervical adenopathy.  Neurological: He is alert.  Skin: Skin is warm and dry. He is not diaphoretic.  Psychiatric: He has a normal mood and affect.     ASSESSMENT/ PLAN:  1. Hypertension: will continue coreg 3.125 mg twice daily asa 81 mg daily  Will begin lisinopril 2.5 mg daily as patient has diabetes.   2. PUD: will continue pepcid 20 mg daily  3. Chronic hepatitis C: status post Harvoni treatment  is followed by GI: will monitor  4. Diabetes:  hgb a1c 7.6 will continue metformin 500 mg daily will continue  lantus   55 units nightly will change novolog to 20 units after meals to prevent hypoglycemia. We may need to increase lantus in the future.   5. Dyslipidemia: will continue zocor 20 mg daily  6. BPH: will continue flomax daily   7. Constipation: will continue miralax daily and senna s 2 tabs twice daily     8.  PVD: is status post left bka; has chronic pain:  Will continue ms contin 15 mg twice daily and will continue oxycodone 5 mg every 4 hours as needed for break through pain   9. Phantom limb pain: will continue neurontin 600 mg three times daily   10. CKD stage III: bun 16.1; creat 1.00; will monitor  11.  CVA: neurologically stable will continue asa 81 mg daily   12. Anemia of chronic disease:  hgb 8.4 will continue iron three times daily  13. Gout: no recent flares; will increase allopurinol to 200 mg daily      Will check cbc; hgb a1c; lipids  cmp in 2 weeks.  Will check uric acid in 4 weeks.       MD is aware of resident's narcotic use and is in agreement with current plan of care. We will attempt to wean resident as apropriate   Ok Edwards NP [facility]  Contact [phone] Monday through Friday 8am- 5pm  After hours call [phone]

## 2016-04-11 ENCOUNTER — Non-Acute Institutional Stay (SKILLED_NURSING_FACILITY): Payer: Medicaid Other | Admitting: Internal Medicine

## 2016-04-11 ENCOUNTER — Encounter: Payer: Self-pay | Admitting: Internal Medicine

## 2016-04-11 DIAGNOSIS — M79671 Pain in right foot: Secondary | ICD-10-CM | POA: Diagnosis not present

## 2016-04-11 DIAGNOSIS — E1143 Type 2 diabetes mellitus with diabetic autonomic (poly)neuropathy: Secondary | ICD-10-CM | POA: Diagnosis not present

## 2016-04-11 DIAGNOSIS — M1A0710 Idiopathic chronic gout, right ankle and foot, without tophus (tophi): Secondary | ICD-10-CM | POA: Diagnosis not present

## 2016-04-11 DIAGNOSIS — G546 Phantom limb syndrome with pain: Secondary | ICD-10-CM

## 2016-04-11 DIAGNOSIS — E1165 Type 2 diabetes mellitus with hyperglycemia: Secondary | ICD-10-CM | POA: Diagnosis not present

## 2016-04-11 DIAGNOSIS — I10 Essential (primary) hypertension: Secondary | ICD-10-CM

## 2016-04-11 DIAGNOSIS — D638 Anemia in other chronic diseases classified elsewhere: Secondary | ICD-10-CM | POA: Diagnosis not present

## 2016-04-11 DIAGNOSIS — IMO0002 Reserved for concepts with insufficient information to code with codable children: Secondary | ICD-10-CM

## 2016-04-11 NOTE — Progress Notes (Signed)
DATE:  April 11, 2016  Location:   [facility] Room Number: [place] of Service: SNF (31)   Extended Emergency Contact Information Primary Emergency Contact: Tye Savoy, [place] [address] [place] Phone: [phone] Relation: Sister Secondary Emergency Contact: Kerrin Mo, [place] [address] [place] Phone: [phone] Relation: Sister  Advanced Directive information Does Patient Have a Medical Advance Directive?: Yes, Type of Advance Directive: Out of facility DNR (pink MOST or yellow form), Pre-existing out of facility DNR order (yellow form or pink MOST form): Pink MOST form placed in chart (order not valid for inpatient use), Does patient want to make changes to medical advance directive?: No - Patient declined  Chief Complaint  Patient presents with  . Medical Management of Chronic Issues    Possible gout flare up    HPI:  66 yo male long term resident seen today for routine f/u. He c/ soreness in right ankle and top of foot that is throbbing and burning. No recent falls. No known trauma. No f/c. CBG 289 today. No low BS reactions. Appetite ok and sleeps well most nights. He has a hx gout and allopurinol was increased to 200mg  daily.  Hypertension - BP stable on coreg 3.125 mg twice daily; ASA 81 mg daily; lisinopril 2.5 mg daily   PUD - stable on pepcid 20 mg daily  Chronic hepatitis C - he is s/p Harvoni treatment and followed by GI  DM - A1c 7.6%. BS uncontrolled. He takes metformin 500 mg daily; lantus   55 units nightly; novolog 20 units after meals to prevent hypoglycemia.   Dyslipidemia - stable on zocor 20 mg daily  BPH - stable on flomax daily   Constipation - stable on miralax daily and senna s 2 tabs twice daily   PVD - he is s/p left BKA. Stable on ASA daily  Chronic pain syndrome/Phantom limb pain - he gets benefit from MS contin 15 mg twice daily; oxycodone 5 mg  every 4 hours as needed for break through pain; neurontin 600 mg three times daily   CKD - stage 3. Cr 1.00  Hx CVA - stable on ASA 81 mg daily   Anemia of chronic disease - stable on iron three times daily. Hgb 8.4  Gout - not well controlled on allopurinol 200 mg daily     Past Medical History:  Diagnosis Date  . Anemia   . Asthma   . BPH (benign prostatic hyperplasia)   . Cellulitis and abscess of foot 07/2015  . Chronic kidney disease    CKD stage 3  . CVA (cerebral vascular accident) ([facility]) 2015   denies residual on 08/15/2015  . Hepatitis C    states he was diagnosed in 2007 or 2007 while living in [place], [place]  . Hyperlipidemia   . Hypertension   . Peripheral neuropathy ([facility])   . Pneumonia X 1  . PVD (peripheral vascular disease) ([facility])   . Type II diabetes mellitus ([facility])     Past Surgical History:  Procedure Laterality Date  . AMPUTATION Left 08/16/2015   Procedure: LEFT BELOW THE KNEE AMPUTATION ;  Surgeon: Serafina Mitchell, MD;  Location: [facility];  Service: Vascular;  Laterality: Left;  . BACK SURGERY    . BELOW KNEE LEG AMPUTATION Left 08/16/2015  . BIOPSY N/A 09/03/2012   Procedure: BIOPSY;  Surgeon: Daneil Dolin, MD;  Location: [facility];  Service: Endoscopy;  Laterality: N/A;  gastric and gastric mucosa  . BIOPSY N/A 12/03/2012   Procedure: BIOPSY;  Surgeon: Daneil Dolin, MD;  Location: [facility];  Service: Endoscopy;  Laterality: N/A;  . COLONOSCOPY WITH PROPOFOL N/A 09/03/2012   GXQ:JJHERDE polyp-removed as outlined above. Prominent internal hemorrhoids. Tubular adenoma  . ESOPHAGOGASTRODUODENOSCOPY (EGD) WITH PROPOFOL N/A 09/03/2012   YCX:KGYJEH hernia. Gastric diverticulum. Gastric ulcers with associated erosions. Duodenal erosions. Status post gastric biopsy. H.PYLORI gastritis   . ESOPHAGOGASTRODUODENOSCOPY (EGD) WITH PROPOFOL N/A 12/03/2012   Dr. Gala Romney: gastric diverticulum, gastric erosions and scar. Previously noted gastric ulcer completed healed. Biopsy  without H.pylori.   . ESOPHAGOGASTRODUODENOSCOPY (EGD) WITH PROPOFOL N/A 01/23/2016   Procedure: ESOPHAGOGASTRODUODENOSCOPY (EGD) WITH PROPOFOL;  Surgeon: Doran Stabler, MD;  Location: [facility];  Service: Gastroenterology;  Laterality: N/A;  . LIVER BIOPSY  2005   Done in [place], [place]. Chronic hepatitis with mild periportal inflammation, lobular unicellular necrosis and portal fibrosis. Grade 2, stage 1-2.  Marland Kitchen MAXIMUM ACCESS (MAS)POSTERIOR LUMBAR INTERBODY FUSION (PLIF) 1 LEVEL N/A 11/25/2013   Procedure: FOR MAXIMUM ACCESS (MAS) POSTERIOR LUMBAR INTERBODY FUSION (PLIF) 1 LEVEL;  Surgeon: Eustace Moore, MD;  Location: [facility];  Service: Neurosurgery;  Laterality: N/A;  FOR MAXIMUM ACCESS (MAS) POSTERIOR LUMBAR INTERBODY FUSION (PLIF) 1 LEVEL LUMBAR 3-4  . PERIPHERAL VASCULAR CATHETERIZATION Left 08/01/2015   Procedure: Lower Extremity Angiography;  Surgeon: Serafina Mitchell, MD;  Location: [facility];  Service: Cardiovascular;  Laterality: Left;  . PERIPHERAL VASCULAR CATHETERIZATION N/A 08/01/2015   Procedure: Abdominal Aortogram;  Surgeon: Serafina Mitchell, MD;  Location: [facility];  Service: Cardiovascular;  Laterality: N/A;  . PERIPHERAL VASCULAR CATHETERIZATION N/A 08/08/2015   Procedure: Abdominal Aortogram w/Lower Extremity;  Surgeon: Serafina Mitchell, MD;  Location: [facility];  Service: Cardiovascular;  Laterality: N/A;  . PERIPHERAL VASCULAR CATHETERIZATION Left 08/08/2015   Procedure: Peripheral Vascular Balloon Angioplasty;  Surgeon: Serafina Mitchell, MD;  Location: [facility];  Service: Cardiovascular;  Laterality: Left;  left popiteal artery, left peronealtrunk, left post tibial  . POLYPECTOMY N/A 09/03/2012   Procedure: POLYPECTOMY;  Surgeon: Daneil Dolin, MD;  Location[facility];  Service: Endoscopy;  Laterality: N/A;  cecal polyp  . TONSILLECTOMY      Patient Care Team: Iona Beard, MD as PCP - General (Family Medicine) Daneil Dolin, MD  as Consulting Physician (Gastroenterology) Herminio Commons, MD as Attending Physician (Cardiology) Meredith Staggers, MD as Consulting Physician (Physical Medicine and Rehabilitation)  Social History   Social History  . Marital status: Divorced    Spouse name: N/A  . Number of children: 1  . Years of education: N/A   Occupational History  . trying to get disability    Social History Main Topics  . Smoking status: Former Smoker    Packs/day: 1.00    Years: 47.00    Types: Cigarettes    Quit date: 07/13/2015  . Smokeless tobacco: Never Used  . Alcohol use No     Comment:  none 08/15/2015 "stopped drinking 3-4 years ago"  . Drug use: Yes    Types: Cocaine     Comment: 08/15/2015 "last used cocaine 3-4 months ago"  . Sexual activity: No   Other Topics Concern  . Not on file   Social History Narrative  . No narrative on file     reports  that he quit smoking about 8 months ago. His smoking use included Cigarettes. He has a 47.00 pack-year smoking history. He has never used smokeless tobacco. He reports that he uses drugs, including Cocaine. He reports that he does not drink alcohol.  Family History  Problem Relation Age of Onset  . Breast cancer Mother   . Heart failure Mother   . Diabetes Father   . Hypertension Father   . Heart disease Father   . Hyperlipidemia Father   . Diabetes Sister   . Hypertension Sister   . Breast cancer Sister   . Colon cancer Neg Hx   . Liver disease Neg Hx    Family Status  Relation Status  . Mother Deceased  . Father Deceased  . Sister   . Sister   . Neg Hx     Immunization History  Administered Date(s) Administered  . Influenza,inj,Quad PF,36+ Mos 11/24/2013  . Influenza-Unspecified 11/16/2015  . PPD Test 09/20/2015, 10/27/2015  . Pneumococcal Polysaccharide-23 11/24/2013    No Known Allergies  Medications: Patient's Medications  New Prescriptions   No medications on file  Previous Medications   ACIDOPHILUS (RISAQUAD)  CAPS CAPSULE    Take 2 capsules by mouth daily.   ALLOPURINOL (ZYLOPRIM) 100 MG TABLET    Give 2 tablet by mouth every evening   ASPIRIN EC 81 MG TABLET    Take 1 tablet (81 mg total) by mouth daily.   CARVEDILOL (COREG) 3.125 MG TABLET    Take 1 tablet (3.125 mg total) by mouth 2 (two) times daily with a meal.   CETIRIZINE (ZYRTEC) 10 MG TABLET    Take 10 mg by mouth daily.   FAMOTIDINE (PEPCID) 20 MG TABLET    Take 1 tablet (20 mg total) by mouth daily.   FERROUS SULFATE 325 (65 FE) MG TABLET    Take 1 tablet (325 mg total) by mouth 3 (three) times daily with meals.   FOLIC ACID (FOLVITE) 1 MG TABLET    Take 1 tablet (1 mg total) by mouth daily.   GABAPENTIN (NEURONTIN) 600 MG TABLET    Take 600 mg by mouth 3 (three) times daily.   GUAIFENESIN 1200 MG TB12    Take 1 tablet by mouth every 12 (twelve) hours.   INSULIN ASPART (NOVOLOG) 100 UNIT/ML INJECTION    Inject 25 Units into the skin 3 (three) times daily before meals.    INSULIN GLARGINE (LANTUS) 100 UNIT/ML INJECTION    Inject 55 Units into the skin at bedtime.    LISINOPRIL (PRINIVIL,ZESTRIL) 5 MG TABLET    Give 2.5 mg by mouth daily   METFORMIN (GLUCOPHAGE) 500 MG TABLET    Take 500 mg by mouth daily with breakfast.   METHOCARBAMOL (ROBAXIN) 500 MG TABLET    Take 500 mg by mouth every 6 (six) hours.   MORPHINE (MS CONTIN) 15 MG 12 HR TABLET    Take 15 mg by mouth every 12 (twelve) hours.   MULTIPLE VITAMINS-MINERALS (DECUBI-VITE PO)    Take 1 capsule by mouth daily.   OXYCODONE HCL, ABUSE DETER, (OXAYDO) 5 MG TABA    Take 1 tablet by mouth every 4 (four) hours as needed (PAIN).    POLYETHYLENE GLYCOL (MIRALAX / GLYCOLAX) PACKET    Take 17 g by mouth daily.   SENNOSIDES-DOCUSATE SODIUM (SENOKOT-S) 8.6-50 MG TABLET    Take 2 tablets by mouth 2 (two) times daily. May take an additional 2 tablets daily as needed for constipation   SIMVASTATIN (  ZOCOR) 20 MG TABLET    Take 20 mg by mouth every evening.    TAMSULOSIN (FLOMAX) 0.4 MG CAPS  CAPSULE    Take 1 capsule (0.4 mg total) by mouth daily.   THIAMINE 100 MG TABLET    Take 1 tablet (100 mg total) by mouth daily.   TORSEMIDE (DEMADEX) 20 MG TABLET    Take 20 mg by mouth 2 (two) times daily.    VITAMIN B-12 500 MCG TABLET    Take 1 tablet (500 mcg total) by mouth daily.  Modified Medications   No medications on file  Discontinued Medications   No medications on file    Review of Systems  Musculoskeletal: Positive for arthralgias, gait problem and joint swelling.  All other systems reviewed and are negative.   Vitals:   04/11/16 1229  BP: 137/72  Pulse: 81  Resp: 18  Temp: 98.6 F (37 C)  SpO2: 98%  Weight: 251 lb (113.9 kg)  Height: 5\' 10"  (1.778 m)   Body mass index is 36.01 kg/m.  Physical Exam  Constitutional: He is oriented to person, place, and time. He appears well-developed and well-nourished.  Sitting in w/c in NAD  HENT:  Nares with enlarged grey dry turbinates. Oropharynx cobblestoning and red but no exudate  Eyes: Pupils are equal, round, and reactive to light. No scleral icterus.  Neck: Neck supple. Carotid bruit is not present. No thyromegaly present.  Cardiovascular: Normal rate, regular rhythm and intact distal pulses.  Exam reveals no gallop and no friction rub.   Murmur (2/6 SEM) heard. Left BKA; No RLE edema. No right calf TTP  Pulmonary/Chest: Effort normal and breath sounds normal. He has no wheezes. He has no rales. He exhibits no tenderness.  Abdominal: Soft. Bowel sounds are normal. He exhibits no distension, no abdominal bruit, no pulsatile midline mass and no mass. There is no hepatomegaly. There is no tenderness. There is no rebound and no guarding.  Musculoskeletal: He exhibits edema, tenderness and deformity.  Left BKA; right foot/ankle swelling with reduced ROM and TTP over arch; right 1st toe plantar surface no calluses/ulcers  Lymphadenopathy:    He has no cervical adenopathy.  Neurological: He is alert and oriented to  person, place, and time.  Skin: Skin is warm, dry and intact. No rash noted.  Psychiatric: He has a normal mood and affect. His behavior is normal. Thought content normal.     Labs reviewed: Nursing Home on 03/20/2016  Component Date Value Ref Range Status  . Hemoglobin 02/27/2016 8.4* 13.5 - 17.5 g/dL Final  . HCT 02/27/2016 28* 41 - 53 % Final  . Platelets 02/27/2016 463* 150 - 399 K/L Final  . WBC 02/27/2016 8.5  10^3/mL Final  Appointment on 01/26/2016  Component Date Value Ref Range Status  . Rest HR 02/09/2016 93  bpm Final  . Rest BP 02/09/2016 174/96  mmHg Final  . Peak HR 02/09/2016 114  bpm Final  . Peak BP 02/09/2016 171/110  mmHg Final  . SSS 02/09/2016 0   Final  . SRS 02/09/2016 4   Final  . SDS 02/09/2016 0   Final  . LHR 02/09/2016 0.42   Final  . TID 02/09/2016 0.95   Final  . LV sys vol 02/09/2016 98  mL Final  . LV dias vol 02/09/2016 166  62 - 150 mL Final  Nursing Home on 01/24/2016  Component Date Value Ref Range Status  . Microalb, Ur 01/12/2016 1.2   Final  .  Uric Acid 01/17/2016 8.3   Final  Admission on 01/23/2016, Discharged on 01/23/2016  Component Date Value Ref Range Status  . Glucose-Capillary 01/23/2016 215* 65 - 99 mg/dL Final  Nursing Home on 01/19/2016  Component Date Value Ref Range Status  . Microalb, Ur 01/12/2016 1.2   Final    No results found.   Assessment/Plan   ICD-9-CM ICD-10-CM   1. Right foot pain 729.5 M79.671    Failing to change as expected  2. Chronic gout of right foot, unspecified cause 274.02 [id number].0710   3. Uncontrolled type II diabetes with peripheral autonomic neuropathy (HCC) 250.62 E11.43    337.1 E11.65   4. Phantom limb pain (HCC) 353.6 G54.6   5. Anemia of chronic disease 285.29 D63.8   6. Essential hypertension 401.9 I10      Check right foot and ankle xray  Change gabapentin 600mg  in AM and PM, 1200mg  qHS  Cont other meds as ordered  PT/OT/ST as indicated  Follow CBGs and adjust insulin as  necessary  f/u with specialists as scheduled  Will follow  Terrel Nesheiwat S. Perlie Gold  [facility] [address] [place], [place] [address] [phone] Cell (Monday-Friday 8 AM - 5 PM) [phone] After 5 PM and follow prompts

## 2016-04-18 ENCOUNTER — Non-Acute Institutional Stay (SKILLED_NURSING_FACILITY): Payer: Medicaid Other | Admitting: Internal Medicine

## 2016-04-18 ENCOUNTER — Encounter: Payer: Self-pay | Admitting: Internal Medicine

## 2016-04-18 DIAGNOSIS — M1A3710 Chronic gout due to renal impairment, right ankle and foot, without tophus (tophi): Secondary | ICD-10-CM | POA: Diagnosis not present

## 2016-04-18 DIAGNOSIS — G546 Phantom limb syndrome with pain: Secondary | ICD-10-CM

## 2016-04-18 DIAGNOSIS — I739 Peripheral vascular disease, unspecified: Secondary | ICD-10-CM | POA: Diagnosis not present

## 2016-04-18 DIAGNOSIS — Z89512 Acquired absence of left leg below knee: Secondary | ICD-10-CM

## 2016-04-18 DIAGNOSIS — S88112A Complete traumatic amputation at level between knee and ankle, left lower leg, initial encounter: Secondary | ICD-10-CM

## 2016-04-18 NOTE — Progress Notes (Addendum)
Patient ID: Cory Weber, male   DOB: 1950-08-17, 66 y.o.   MRN: [id number]    DATE:  04/18/2016  Location:    [facility] Room Number: [place] of Service: SNF (31)   Extended Emergency Contact Information Primary Emergency Contact: Tye Savoy, [place] [address] [place] [employer] Phone: [phone] Relation: Sister Secondary Emergency Contact: Kerrin Mo, [place] [address] [place] [employer] Phone: [phone] Relation: Sister  Advanced Directive information Does Patient Have a Medical Advance Directive?: Yes, Type of Advance Directive: Out of facility DNR (pink MOST or yellow form), Pre-existing out of facility DNR order (yellow form or pink MOST form): Pink MOST form placed in chart (order not valid for inpatient use)  Chief Complaint  Patient presents with  . Acute Visit    Right leg pain    HPI:  66 yo male long term resident seen today for RLE pain. He reports pain is constant, throbbing pain that awakens from sleep. He has numbness/tingling in leg/foot. No recent fall per pt. Pain regimen consists of ms contin 15 mg twice daily; oxy IR 5 mg every 4 hours as needed for break through pain. He also takes 1800mg  neurontin daily. He has PAD and is followed by vascular sx. He is s/p balloon angioplasty to left popliteal artery/tibioperoneal trunk and failed angioplasty to left posterior tibial artery 08/08/15. No recent imaging of RLE done  Hypertension - stable on coreg 3.125 mg twice daily; ASA 81 mg daily; lisinopril 2.5 mg daily for renal protection  PUD - stable on pepcid 20 mg daily  Chronic hepatitis C -s/p Harvoni treatment; followed by GI  DM - A1c 7.6%. He takes metformin 500 mg daily;  lantus 55 units nightly;  novolog 20 units after meals to prevent hypoglycemia. He is on lisinopril low dose for renal protection  Dyslipidemia - stable on zocor 20 mg daily  BPH - stable on flomax daily    Constipation - stable on miralax daily and senna s 2 tabs twice daily   PVD - s/p left BKA. He has chronic pain on ms contin 15 mg twice daily; oxy IR 5 mg every 4 hours as needed for break through pain   Phantom limb pain - takes neurontin 600 mg three times daily   CKD - stage 3. Cr 1.0  Hx CVA - stable on ASA 81 mg daily   Anemia of chronic disease - hgb 8.4. Takes  iron three times daily  Gout - occasional flares; takes allopurinol to 200 mg daily . He admits to consuming pork and red meat often    Past Medical History:  Diagnosis Date  . Anemia   . Asthma   . BPH (benign prostatic hyperplasia)   . Cellulitis and abscess of foot 07/2015  . Chronic kidney disease    CKD stage 3  . CVA (cerebral vascular accident) ([facility]) 2015   denies residual on 08/15/2015  . Hepatitis C    states he was diagnosed in 2007 or 2007 while living in [place], [place]  . Hyperlipidemia   . Hypertension   . Peripheral neuropathy ([facility])   . Pneumonia X 1  . PVD (peripheral vascular disease) ([facility])   . Type II diabetes mellitus ([facility])     Past Surgical History:  Procedure Laterality Date  . AMPUTATION Left 08/16/2015   Procedure: LEFT BELOW THE KNEE AMPUTATION ;  Surgeon: Serafina Mitchell, MD;  Location: [facility];  Service: Vascular;  Laterality: Left;  . BACK SURGERY    . BELOW KNEE LEG AMPUTATION Left 08/16/2015  . BIOPSY N/A 09/03/2012   Procedure: BIOPSY;  Surgeon: Daneil Dolin, MD;  Location: [facility];  Service: Endoscopy;  Laterality: N/A;  gastric and gastric mucosa  . BIOPSY N/A 12/03/2012   Procedure: BIOPSY;  Surgeon: Daneil Dolin, MD;  Location: [facility];  Service: Endoscopy;  Laterality: N/A;  . COLONOSCOPY WITH PROPOFOL N/A 09/03/2012   GYB:[facility]SLHTD polyp-removed as outlined above. Prominent internal hemorrhoids. Tubular adenoma  . ESOPHAGOGASTRODUODENOSCOPY (EGD) WITH PROPOFOL N/A 09/03/2012   SKA:JGOTLX hernia. Gastric diverticulum. Gastric ulcers with associated erosions. Duodenal  erosions. Status post gastric biopsy. H.PYLORI gastritis   . ESOPHAGOGASTRODUODENOSCOPY (EGD) WITH PROPOFOL N/A 12/03/2012   Dr. Gala Romney: gastric diverticulum, gastric erosions and scar. Previously noted gastric ulcer completed healed. Biopsy without H.pylori.   . ESOPHAGOGASTRODUODENOSCOPY (EGD) WITH PROPOFOL N/A 01/23/2016   Procedure: ESOPHAGOGASTRODUODENOSCOPY (EGD) WITH PROPOFOL;  Surgeon: Doran Stabler, MD;  Location: [facility];  Service: Gastroenterology;  Laterality: N/A;  . LIVER BIOPSY  2005   Done in [place], [place]. Chronic hepatitis with mild periportal inflammation, lobular unicellular necrosis and portal fibrosis. Grade 2, stage 1-2.  Marland Kitchen MAXIMUM ACCESS (MAS)POSTERIOR LUMBAR INTERBODY FUSION (PLIF) 1 LEVEL N/A 11/25/2013   Procedure: FOR MAXIMUM ACCESS (MAS) POSTERIOR LUMBAR INTERBODY FUSION (PLIF) 1 LEVEL;  Surgeon: Eustace Moore, MD;  Location: [facility];  Service: Neurosurgery;  Laterality: N/A;  FOR MAXIMUM ACCESS (MAS) POSTERIOR LUMBAR INTERBODY FUSION (PLIF) 1 LEVEL LUMBAR 3-4  . PERIPHERAL VASCULAR CATHETERIZATION Left 08/01/2015   Procedure: Lower Extremity Angiography;  Surgeon: Serafina Mitchell, MD;  Location: [facility];  Service: Cardiovascular;  Laterality: Left;  . PERIPHERAL VASCULAR CATHETERIZATION N/A 08/01/2015   Procedure: Abdominal Aortogram;  Surgeon: Serafina Mitchell, MD;  Location: [facility];  Service: Cardiovascular;  Laterality: N/A;  . PERIPHERAL VASCULAR CATHETERIZATION N/A 08/08/2015   Procedure: Abdominal Aortogram w/Lower Extremity;  Surgeon: Serafina Mitchell, MD;  Location: [facility];  Service: Cardiovascular;  Laterality: N/A;  . PERIPHERAL VASCULAR CATHETERIZATION Left 08/08/2015   Procedure: Peripheral Vascular Balloon Angioplasty;  Surgeon: Serafina Mitchell, MD;  Location: [facility];  Service: Cardiovascular;  Laterality: Left;  left popiteal artery, left peronealtrunk, left post tibial  . POLYPECTOMY N/A 09/03/2012    Procedure: POLYPECTOMY;  Surgeon: Daneil Dolin, MD;  Location: [facility];  Service: Endoscopy;  Laterality: N/A;  cecal polyp  . TONSILLECTOMY      Patient Care Team: Iona Beard, MD as PCP - General (Family Medicine) Daneil Dolin, MD as Consulting Physician (Gastroenterology) Herminio Commons, MD as Attending Physician (Cardiology) Meredith Staggers, MD as Consulting Physician (Physical Medicine and Rehabilitation)  Social History   Social History  . Marital status: Divorced    Spouse name: N/A  . Number of children: 1  . Years of education: N/A   Occupational History  . trying to get disability    Social History Main Topics  . Smoking status: Former Smoker    Packs/day: 1.00    Years: 47.00    Types: Cigarettes    Quit date: 07/13/2015  . Smokeless tobacco: Never Used  . Alcohol use No     Comment:  none 08/15/2015 "stopped drinking 3-4 years ago"  . Drug use: Yes    Types: Cocaine  Comment: 08/15/2015 "last used cocaine 3-4 months ago"  . Sexual activity: No   Other Topics Concern  . Not on file   Social History Narrative  . No narrative on file     reports that he quit smoking about 9 months ago. His smoking use included Cigarettes. He has a 47.00 pack-year smoking history. He has never used smokeless tobacco. He reports that he uses drugs, including Cocaine. He reports that he does not drink alcohol.  Family History  Problem Relation Age of Onset  . Breast cancer Mother   . Heart failure Mother   . Diabetes Father   . Hypertension Father   . Heart disease Father   . Hyperlipidemia Father   . Diabetes Sister   . Hypertension Sister   . Breast cancer Sister   . Colon cancer Neg Hx   . Liver disease Neg Hx    Family Status  Relation Status  . Mother Deceased  . Father Deceased  . Sister   . Sister   . Neg Hx     Immunization History  Administered Date(s) Administered  . Influenza,inj,Quad PF,36+ Mos 11/24/2013  . Influenza-Unspecified 11/16/2015   . PPD Test 09/20/2015, 10/27/2015  . Pneumococcal Polysaccharide-23 11/24/2013    No Known Allergies  Medications: Patient's Medications  New Prescriptions   No medications on file  Previous Medications   ACIDOPHILUS (RISAQUAD) CAPS CAPSULE    Take 2 capsules by mouth daily.   ALLOPURINOL (ZYLOPRIM) 100 MG TABLET    Give 2 tablet by mouth every evening   ASPIRIN EC 81 MG TABLET    Take 1 tablet (81 mg total) by mouth daily.   CARVEDILOL (COREG) 3.125 MG TABLET    Take 1 tablet (3.125 mg total) by mouth 2 (two) times daily with a meal.   CETIRIZINE (ZYRTEC) 10 MG TABLET    Take 10 mg by mouth daily.   FAMOTIDINE (PEPCID) 20 MG TABLET    Take 1 tablet (20 mg total) by mouth daily.   FERROUS SULFATE 325 (65 FE) MG TABLET    Take 1 tablet (325 mg total) by mouth 3 (three) times daily with meals.   FOLIC ACID (FOLVITE) 1 MG TABLET    Take 1 tablet (1 mg total) by mouth daily.   GABAPENTIN (NEURONTIN) 600 MG TABLET    Take 600 mg by mouth 3 (three) times daily.   GUAIFENESIN 1200 MG TB12    Take 1 tablet by mouth every 12 (twelve) hours.   INSULIN ASPART (NOVOLOG) 100 UNIT/ML INJECTION    Inject 22 Units into the skin 3 (three) times daily before meals.    INSULIN GLARGINE (LANTUS) 100 UNIT/ML INJECTION    Inject 55 Units into the skin at bedtime.    LISINOPRIL (PRINIVIL,ZESTRIL) 5 MG TABLET    Give 2.5 mg by mouth daily   METFORMIN (GLUCOPHAGE) 500 MG TABLET    Take 500 mg by mouth daily with breakfast.   METHOCARBAMOL (ROBAXIN) 500 MG TABLET    Take 500 mg by mouth every 6 (six) hours.   MORPHINE (MS CONTIN) 15 MG 12 HR TABLET    Take 15 mg by mouth every 12 (twelve) hours.   MULTIPLE VITAMINS-MINERALS (DECUBI-VITE PO)    Take 1 capsule by mouth daily.   OXYCODONE HCL, ABUSE DETER, (OXAYDO) 5 MG TABA    Take 1 tablet by mouth every 4 (four) hours as needed (PAIN).    POLYETHYLENE GLYCOL (MIRALAX / GLYCOLAX) PACKET    Take  17 g by mouth daily.   SENNOSIDES-DOCUSATE SODIUM (SENOKOT-S)  8.6-50 MG TABLET    Take 2 tablets by mouth 2 (two) times daily. May take an additional 2 tablets daily as needed for constipation   SIMVASTATIN (ZOCOR) 20 MG TABLET    Take 20 mg by mouth every evening.    TAMSULOSIN (FLOMAX) 0.4 MG CAPS CAPSULE    Take 1 capsule (0.4 mg total) by mouth daily.   THIAMINE 100 MG TABLET    Take 1 tablet (100 mg total) by mouth daily.   TORSEMIDE (DEMADEX) 20 MG TABLET    Take 20 mg by mouth 2 (two) times daily.    VITAMIN B-12 500 MCG TABLET    Take 1 tablet (500 mcg total) by mouth daily.  Modified Medications   No medications on file  Discontinued Medications   No medications on file    Review of Systems  Vitals:   04/18/16 1424  BP: (!) 157/90  Pulse: 100  Resp: 18  Temp: 98.6 F (37 C)  TempSrc: Oral  SpO2: 98%  Weight: 256 lb 12.8 oz (116.5 kg)   Body mass index is 36.85 kg/m.  Physical Exam  Constitutional: He is oriented to person, place, and time. He appears well-developed and well-nourished.  Looks well sitting in w/c in NAD  Cardiovascular:  Pulses:      Dorsalis pedis pulses are 1+ on the right side. Left dorsalis pedis pulse not accessible.       Posterior tibial pulses are 1+ on the right side. Left posterior tibial pulse not accessible.  Left BKA; +1 pitting RLE edema with no calf swelling  Musculoskeletal: He exhibits edema, tenderness and deformity (left BKA).  Neurological: He is alert and oriented to person, place, and time.  Skin: Skin is warm and dry. No rash noted.  Psychiatric: He has a normal mood and affect. His behavior is normal. Thought content normal.   Diabetic Foot Exam - Simple   Simple Foot Form Diabetic Foot exam was performed with the following findings:  Yes 04/18/2016  3:39 PM  Visual Inspection See comments:  Yes Sensation Testing See comments:  Yes Pulse Check See comments:  Yes Comments Reduced sensation to tough; no right foot lesions/calluses/ulcerations; left BKA; DP/PT pulse reduced to  palpation on right      Labs reviewed: Nursing Home on 03/20/2016  Component Date Value Ref Range Status  . Hemoglobin 02/27/2016 8.4* 13.5 - 17.5 g/dL Final  . HCT 02/27/2016 28* 41 - 53 % Final  . Platelets 02/27/2016 463* 150 - 399 K/L Final  . WBC 02/27/2016 8.5  10^3/mL Final  Appointment on 01/26/2016  Component Date Value Ref Range Status  . Rest HR 02/09/2016 93  bpm Final  . Rest BP 02/09/2016 174/96  mmHg Final  . Peak HR 02/09/2016 114  bpm Final  . Peak BP 02/09/2016 171/110  mmHg Final  . SSS 02/09/2016 0   Final  . SRS 02/09/2016 4   Final  . SDS 02/09/2016 0   Final  . LHR 02/09/2016 0.42   Final  . TID 02/09/2016 0.95   Final  . LV sys vol 02/09/2016 98  mL Final  . LV dias vol 02/09/2016 166  62 - 150 mL Final  Nursing Home on 01/24/2016  Component Date Value Ref Range Status  . Microalb, Ur 01/12/2016 1.2   Final  . Uric Acid 01/17/2016 8.3   Final  Admission on 01/23/2016, Discharged on 01/23/2016  Component  Date Value Ref Range Status  . Glucose-Capillary 01/23/2016 215* 65 - 99 mg/dL Final    No results found.   Assessment/Plan   ICD-9-CM ICD-10-CM   1. PVD (peripheral vascular disease) (HCC) 443.9 I73.9   2. Chronic gout due to renal impairment involving toe of right foot without tophus 274.02 [id number]    588.89    3. Amputation of left lower extremity below knee (HCC) V49.75 Z89.512   4. Phantom limb pain (HCC) 353.6 G54.6      Refer to vascular surgery for further mx options  Gabapentin dose adjusted approximately 1 week ago to 600mg  BID and to take 2 tabs qhs  Increase ASA 325mg  daily  Cont other meds as ordered  Will follow  Korey Arroyo S. Perlie Gold  [facility] [address] [place], [place] [address] [phone] Cell (Monday-Friday 8 AM - 5 PM) [phone] After 5 PM and follow prompts

## 2016-04-19 LAB — CBC AND DIFFERENTIAL
HCT: 26 % — AB (ref 41–53)
Hemoglobin: 8.2 g/dL — AB (ref 13.5–17.5)
PLATELETS: 416 10*3/uL — AB (ref 150–399)
WBC: 10.9 10^3/mL

## 2016-04-24 ENCOUNTER — Encounter: Payer: Self-pay | Admitting: Adult Health

## 2016-04-24 ENCOUNTER — Non-Acute Institutional Stay (SKILLED_NURSING_FACILITY): Payer: Medicaid Other | Admitting: Adult Health

## 2016-04-24 DIAGNOSIS — E1165 Type 2 diabetes mellitus with hyperglycemia: Secondary | ICD-10-CM

## 2016-04-24 DIAGNOSIS — Z89512 Acquired absence of left leg below knee: Secondary | ICD-10-CM | POA: Diagnosis not present

## 2016-04-24 DIAGNOSIS — I739 Peripheral vascular disease, unspecified: Secondary | ICD-10-CM

## 2016-04-24 DIAGNOSIS — IMO0002 Reserved for concepts with insufficient information to code with codable children: Secondary | ICD-10-CM

## 2016-04-24 DIAGNOSIS — I10 Essential (primary) hypertension: Secondary | ICD-10-CM | POA: Diagnosis not present

## 2016-04-24 DIAGNOSIS — E1143 Type 2 diabetes mellitus with diabetic autonomic (poly)neuropathy: Secondary | ICD-10-CM | POA: Diagnosis not present

## 2016-04-24 DIAGNOSIS — M1A3710 Chronic gout due to renal impairment, right ankle and foot, without tophus (tophi): Secondary | ICD-10-CM

## 2016-04-24 DIAGNOSIS — S88112A Complete traumatic amputation at level between knee and ankle, left lower leg, initial encounter: Secondary | ICD-10-CM

## 2016-04-24 NOTE — Progress Notes (Signed)
Location:   [facility] Room Number: [place] of Service:  SNF (31)    CODE STATUS: Full Code  No Known Allergies  Chief Complaint  Patient presents with  . Discharge Note    Discharge    HPI:  He is being discharged to home after a prolonged admission at this facility. He will need home health for pt/ot/rn. He will need a standard wheelchair; tub bench and 3:1 commode. He will need his prescriptions written and will need to follow up with his medical provider.     Past Medical History:  Diagnosis Date  . Anemia   . Asthma   . BPH (benign prostatic hyperplasia)   . Cellulitis and abscess of foot 07/2015  . Chronic kidney disease    CKD stage 3  . CVA (cerebral vascular accident) ([place]) 2015   denies residual on 08/15/2015  . Hepatitis C    states he was diagnosed in 2007 or 2007 while living in [place], [place]  . Hyperlipidemia   . Hypertension   . Peripheral neuropathy ([place])   . Pneumonia X 1  . PVD (peripheral vascular disease) ([place])   . Type II diabetes mellitus ([place])     Past Surgical History:  Procedure Laterality Date  . AMPUTATION Left 08/16/2015   Procedure: LEFT BELOW THE KNEE AMPUTATION ;  Surgeon: Serafina Mitchell, MD;  Location: [facility];  Service: Vascular;  Laterality: Left;  . BACK SURGERY    . BELOW KNEE LEG AMPUTATION Left 08/16/2015  . BIOPSY N/A 09/03/2012   Procedure: BIOPSY;  Surgeon: Daneil Dolin, MD;  Location: [facility];  Service: Endoscopy;  Laterality: N/A;  gastric and gastric mucosa  . BIOPSY N/A 12/03/2012   Procedure: BIOPSY;  Surgeon: Daneil Dolin, MD;  Location: [facility];  Service: Endoscopy;  Laterality: N/A;  . COLONOSCOPY WITH PROPOFOL N/A 09/03/2012   IHK:VQQVZDG polyp-removed as outlined above. Prominent internal hemorrhoids. Tubular adenoma  . ESOPHAGOGASTRODUODENOSCOPY (EGD) WITH PROPOFOL N/A 09/03/2012   LOV:FIEPPI hernia. Gastric diverticulum. Gastric ulcers with associated erosions. Duodenal erosions. Status post  gastric biopsy. H.PYLORI gastritis   . ESOPHAGOGASTRODUODENOSCOPY (EGD) WITH PROPOFOL N/A 12/03/2012   Dr. Gala Romney: gastric diverticulum, gastric erosions and scar. Previously noted gastric ulcer completed healed. Biopsy without H.pylori.   . ESOPHAGOGASTRODUODENOSCOPY (EGD) WITH PROPOFOL N/A 01/23/2016   Procedure: ESOPHAGOGASTRODUODENOSCOPY (EGD) WITH PROPOFOL;  Surgeon: Doran Stabler, MD;  Location: [facility];  Service: Gastroenterology;  Laterality: N/A;  . LIVER BIOPSY  2005   Done in [place], [place]. Chronic hepatitis with mild periportal inflammation, lobular unicellular necrosis and portal fibrosis. Grade 2, stage 1-2.  Marland Kitchen MAXIMUM ACCESS (MAS)POSTERIOR LUMBAR INTERBODY FUSION (PLIF) 1 LEVEL N/A 11/25/2013   Procedure: FOR MAXIMUM ACCESS (MAS) POSTERIOR LUMBAR INTERBODY FUSION (PLIF) 1 LEVEL;  Surgeon: Eustace Moore, MD;  Location: [facility];  Service: Neurosurgery;  Laterality: N/A;  FOR MAXIMUM ACCESS (MAS) POSTERIOR LUMBAR INTERBODY FUSION (PLIF) 1 LEVEL LUMBAR 3-4  . PERIPHERAL VASCULAR CATHETERIZATION Left 08/01/2015   Procedure: Lower Extremity Angiography;  Surgeon: Serafina Mitchell, MD;  Location: [facility];  Service: Cardiovascular;  Laterality: Left;  . PERIPHERAL VASCULAR CATHETERIZATION N/A 08/01/2015   Procedure: Abdominal Aortogram;  Surgeon: Serafina Mitchell, MD;  Location: [facility];  Service: Cardiovascular;  Laterality: N/A;  . PERIPHERAL VASCULAR CATHETERIZATION N/A 08/08/2015   Procedure: Abdominal Aortogram w/Lower Extremity;  Surgeon: Serafina Mitchell, MD;  Location: [facility];  Service: Cardiovascular;  Laterality: N/A;  . PERIPHERAL VASCULAR CATHETERIZATION Left 08/08/2015   Procedure: Peripheral Vascular Balloon Angioplasty;  Surgeon: Serafina Mitchell, MD;  Location: [facility];  Service: Cardiovascular;  Laterality: Left;  left popiteal artery, left peronealtrunk, left post tibial  . POLYPECTOMY N/A 09/03/2012   Procedure:  POLYPECTOMY;  Surgeon: Daneil Dolin, MD;  Location: [facility];  Service: Endoscopy;  Laterality: N/A;  cecal polyp  . TONSILLECTOMY      Social History   Social History  . Marital status: Divorced    Spouse name: N/A  . Number of children: 1  . Years of education: N/A   Occupational History  . trying to get disability    Social History Main Topics  . Smoking status: Former Smoker    Packs/day: 1.00    Years: 47.00    Types: Cigarettes    Quit date: 07/13/2015  . Smokeless tobacco: Never Used  . Alcohol use No     Comment:  none 08/15/2015 "stopped drinking 3-4 years ago"  . Drug use: Yes    Types: Cocaine     Comment: 08/15/2015 "last used cocaine 3-4 months ago"  . Sexual activity: No   Other Topics Concern  . Not on file   Social History Narrative  . No narrative on file   Family History  Problem Relation Age of Onset  . Breast cancer Mother   . Heart failure Mother   . Diabetes Father   . Hypertension Father   . Heart disease Father   . Hyperlipidemia Father   . Diabetes Sister   . Hypertension Sister   . Breast cancer Sister   . Colon cancer Neg Hx   . Liver disease Neg Hx     VITAL SIGNS BP (!) 149/94   Pulse 60   Temp 98.6 F (37 C)   Resp 18   Ht 5\' 10"  (1.778 m)   Wt 256 lb 12.8 oz (116.5 kg)   SpO2 98%   BMI 36.85 kg/m   Patient's Medications  New Prescriptions   No medications on file  Previous Medications   ACIDOPHILUS (RISAQUAD) CAPS CAPSULE    Take 2 capsules by mouth daily.   ALLOPURINOL (ZYLOPRIM) 100 MG TABLET    Give 2 tablet by mouth every evening   ASPIRIN 325 MG TABLET    Take 325 mg by mouth daily.   CARVEDILOL (COREG) 3.125 MG TABLET    Take 1 tablet (3.125 mg total) by mouth 2 (two) times daily with a meal.   CETIRIZINE (ZYRTEC) 10 MG TABLET    Take 10 mg by mouth daily.   CYANOCOBALAMIN 500 MCG TABLET    Take 2 tablets by mouth one time daily   FAMOTIDINE (PEPCID) 20 MG TABLET    Take 1 tablet (20 mg total) by mouth daily.    FERROUS SULFATE 325 (65 FE) MG TABLET    Take 1 tablet (325 mg total) by mouth 3 (three) times daily with meals.   FOLIC ACID (FOLVITE) 1 MG TABLET    Take 1 tablet (1 mg total) by mouth daily.   GABAPENTIN (NEURONTIN) 600 MG TABLET    Take 600 mg by mouth 3 (three) times daily. Take 1 tablet in Am, 1 tablet in afternoon and 2 tablets at bedtime   GUAIFENESIN 1200 MG TB12    Take 1 tablet by mouth every 12 (twelve) hours.   INSULIN ASPART (NOVOLOG) 100 UNIT/ML INJECTION    Inject 22 Units into the  skin 3 (three) times daily before meals.    INSULIN GLARGINE (LANTUS) 100 UNIT/ML INJECTION    Inject 55 Units into the skin at bedtime.    LISINOPRIL (PRINIVIL,ZESTRIL) 5 MG TABLET    Give 2.5 mg by mouth daily   METFORMIN (GLUCOPHAGE) 500 MG TABLET    Take 500 mg by mouth daily with breakfast.   METHOCARBAMOL (ROBAXIN) 500 MG TABLET    Take 500 mg by mouth every 6 (six) hours.   MORPHINE (MS CONTIN) 15 MG 12 HR TABLET    Take 15 mg by mouth every 12 (twelve) hours.   MULTIPLE VITAMINS-MINERALS (DECUBI-VITE PO)    Take 1 capsule by mouth daily.   OXYCODONE HCL, ABUSE DETER, (OXAYDO) 5 MG TABA    Take 1 tablet by mouth every 4 (four) hours as needed (PAIN).    POLYETHYLENE GLYCOL (MIRALAX / GLYCOLAX) PACKET    Take 17 g by mouth daily.   SENNOSIDES-DOCUSATE SODIUM (SENOKOT-S) 8.6-50 MG TABLET    Take 2 tablets by mouth 2 (two) times daily. May take an additional 2 tablets daily as needed for constipation   SIMVASTATIN (ZOCOR) 20 MG TABLET    Take 20 mg by mouth every evening.    TAMSULOSIN (FLOMAX) 0.4 MG CAPS CAPSULE    Take 1 capsule (0.4 mg total) by mouth daily.   THIAMINE 100 MG TABLET    Take 1 tablet (100 mg total) by mouth daily.   TORSEMIDE (DEMADEX) 20 MG TABLET    Take 20 mg by mouth 2 (two) times daily.   Modified Medications   No medications on file  Discontinued Medications   ASPIRIN EC 81 MG TABLET    Take 1 tablet (81 mg total) by mouth daily.   VITAMIN B-12 500 MCG TABLET    Take 1  tablet (500 mcg total) by mouth daily.     SIGNIFICANT DIAGNOSTIC EXAMS  08-03-15: TEE: - Left ventricle: Septal and apical hypokinesis. The cavity size was mildly dilated. Wall thickness was increased in a pattern of moderate LVH. Systolic function was mildly reduced. The estimated ejection fraction was in the range of 45% to 50%. Wall motion was normal; there were no regional wall motion abnormalities. Left ventricular diastolic function parameters were normal. - Aortic valve: Severely calcified non coronary cusp. - Mitral valve: Calcified annulus. Mildly thickened leaflets . - Atrial septum: No defect or patent foramen ovale was identified.  09-05-15: chest x-ray: No acute cardiopulmonary disease.  11-03-15: abdominal ultrasound: no acute abnormality  11-25-15: chest x-ray: No acute cardiopulmonary process seen.  11-26-15: ct of chest: 1. Clusters of nodules and "tree-in-bud" opacities in the right greater than left lung apices compatible with bronchiolitis. 2. Aortic valvular calcification associated with aortic stenosis. 3. Aortic atherosclerosis. 4. Moderate coronary artery calcification.      LABS REVIEWED:   08-31-15: wbc 10.7; hgb 8.7; hct 27.5; mcv 86.8; plt 453; glucose 163; bun 32; creat 1.42; k+4.1; na++132; liver normal albumin 2.6 08-26-15: wbc 9.1; hgb 8.9; hct 28.5; mcv 86.6; plt 572; glucose 140; bun 26; creat 1.18; k+ 4.4; na++132 09-01-15: wbc 8.0; hgb 8.7; hct 27.6;mcv 87.9; plt 456; glucose 128; bun 33; creat 1.26; k+ 5.5; na++ 134 09-04-15: glucose 117; bun 24; creat 1.02; k+ 4.7; na++ 134  09-18-15: glucose 166; bun 10.4; creat 0.93; k+ 4.1; na++ 140  11-25-15: wbc 8.9; hgb 6.3; hct 70.6; mcv 76.3; plt 462; glucose 397; bun 14; creat 1.06; k+ 4.1; na++ 129 11-26-15; hgb a1c 7.6; vit  B 12: 287; iron 67; tibc 451; ferritin 8; ldh 149  12-27-15: glucose 520; bun 16.1; creat 1.00; k+ 4.6; na++ 134 01-12-16: urine micro-albumin <1.2 01-17-16: uric acid: 8.3    Review of  Systems  Constitutional: Negative for malaise/fatigue.  Respiratory: Negative for cough and shortness of breath.   Cardiovascular: Negative for chest pain, palpitations has right lower extremity edema  Gastrointestinal: Negative for abdominal pain, constipation and heartburn.  Musculoskeletal: Negative for back pain, joint pain and myalgias.  Skin: Negative.   Neurological: Negative for dizziness.  Psychiatric/Behavioral: The patient is not nervous/anxious.     Physical Exam  Constitutional: No distress.  Eyes: Conjunctivae are normal.  Neck: Neck supple. No JVD present. No thyromegaly present.  Cardiovascular: Normal rate, regular rhythm and intact distal pulses.   Respiratory: Effort normal and breath sounds normal. No respiratory distress. He has no wheezes.  GI: Soft. Bowel sounds are normal. He exhibits no distension. There is no tenderness.  Musculoskeletal: 2+ right lower extremity edema   Able to move all extremities  Status post left bka  Lymphadenopathy:    He has no cervical adenopathy.  Neurological: He is alert.  Skin: Skin is warm and dry. He is not diaphoretic.  Psychiatric: He has a normal mood and affect.     ASSESSMENT/ PLAN:  Patient is being discharged with the following home health services:  Pt/ot/rn: to evaluate and treat as indicated for gait; balance strength; adl training and medication management.   Patient is being discharged with the following durable medical equipment:  Tub bench; 3:1 commode with drop arm. He will require a standard wheelchair with amputee pad; wheelchair cushion leg rests; anti-tippers and brake extensions in order to allow him to maintain his current level of independence with his alds which cannot be achieved with a walker.   Patient has been advised to f/u with their PCP in 1-2 weeks to bring them up to date on their rehab stay.  Social services at facility was responsible for arranging this appointment.  Pt was provided with a 30  day supply of prescriptions for medications and refills must be obtained from their PCP.  For controlled substances, a more limited supply may be provided adequate until PCP appointment only. #30 ms contin 15 mg tabs; #20 oxycodone 5 mg tabs     Time spent with patient  45 minutes >50% time spent counseling; reviewing medical record; tests; labs; and developing future plan of care    Ok Edwards NP [facility]  Contact [phone] Monday through Friday 8am- 5pm  After hours call [phone]

## 2016-04-26 ENCOUNTER — Encounter: Payer: Self-pay | Admitting: Surgery

## 2016-04-29 ENCOUNTER — Ambulatory Visit: Payer: Medicaid Other | Admitting: Surgery

## 2016-05-01 ENCOUNTER — Encounter: Payer: Medicaid Other | Admitting: Physical Medicine & Rehabilitation

## 2016-05-11 ENCOUNTER — Emergency Department (HOSPITAL_COMMUNITY)
Admission: EM | Admit: 2016-05-11 | Discharge: 2016-05-12 | Disposition: A | Discharge status: Home / Self Care | Payer: Medicaid Other | Attending: Emergency Medicine | Admitting: Emergency Medicine

## 2016-05-11 ENCOUNTER — Encounter (HOSPITAL_COMMUNITY): Payer: Self-pay

## 2016-05-11 DIAGNOSIS — Z79899 Other long term (current) drug therapy: Secondary | ICD-10-CM | POA: Diagnosis not present

## 2016-05-11 DIAGNOSIS — N183 Chronic kidney disease, stage 3 (moderate): Secondary | ICD-10-CM | POA: Insufficient documentation

## 2016-05-11 DIAGNOSIS — Z9114 Patient's other noncompliance with medication regimen: Secondary | ICD-10-CM | POA: Diagnosis not present

## 2016-05-11 DIAGNOSIS — E1165 Type 2 diabetes mellitus with hyperglycemia: Secondary | ICD-10-CM | POA: Diagnosis present

## 2016-05-11 DIAGNOSIS — I129 Hypertensive chronic kidney disease with stage 1 through stage 4 chronic kidney disease, or unspecified chronic kidney disease: Secondary | ICD-10-CM | POA: Diagnosis not present

## 2016-05-11 DIAGNOSIS — J45909 Unspecified asthma, uncomplicated: Secondary | ICD-10-CM | POA: Insufficient documentation

## 2016-05-11 DIAGNOSIS — Z87891 Personal history of nicotine dependence: Secondary | ICD-10-CM | POA: Diagnosis not present

## 2016-05-11 DIAGNOSIS — Z794 Long term (current) use of insulin: Secondary | ICD-10-CM | POA: Insufficient documentation

## 2016-05-11 DIAGNOSIS — R739 Hyperglycemia, unspecified: Secondary | ICD-10-CM

## 2016-05-11 DIAGNOSIS — Z7982 Long term (current) use of aspirin: Secondary | ICD-10-CM | POA: Insufficient documentation

## 2016-05-11 DIAGNOSIS — E1122 Type 2 diabetes mellitus with diabetic chronic kidney disease: Secondary | ICD-10-CM | POA: Insufficient documentation

## 2016-05-11 LAB — CBC WITH DIFFERENTIAL/PLATELET
Basophils Absolute: 0 10*3/uL (ref 0.0–0.1)
Basophils Relative: 0 %
EOS ABS: 0 10*3/uL (ref 0.0–0.7)
EOS PCT: 0 %
HCT: 31.7 % — ABNORMAL LOW (ref 39.0–52.0)
Hemoglobin: 9.9 g/dL — ABNORMAL LOW (ref 13.0–17.0)
Lymphocytes Relative: 11 %
Lymphs Abs: 1.8 10*3/uL (ref 0.7–4.0)
MCH: 25.4 pg — AB (ref 26.0–34.0)
MCHC: 31.2 g/dL (ref 30.0–36.0)
MCV: 81.3 fL (ref 78.0–100.0)
MONOS PCT: 4 %
Monocytes Absolute: 0.7 10*3/uL (ref 0.1–1.0)
NEUTROS PCT: 85 %
Neutro Abs: 13.3 10*3/uL — ABNORMAL HIGH (ref 1.7–7.7)
PLATELETS: 497 10*3/uL — AB (ref 150–400)
RBC: 3.9 MIL/uL — ABNORMAL LOW (ref 4.22–5.81)
RDW: 16.5 % — ABNORMAL HIGH (ref 11.5–15.5)
WBC: 15.7 10*3/uL — AB (ref 4.0–10.5)

## 2016-05-11 LAB — URINALYSIS, ROUTINE W REFLEX MICROSCOPIC
BACTERIA UA: NONE SEEN
BILIRUBIN URINE: NEGATIVE
HGB URINE DIPSTICK: NEGATIVE
Ketones, ur: NEGATIVE mg/dL
LEUKOCYTES UA: NEGATIVE
NITRITE: NEGATIVE
Protein, ur: NEGATIVE mg/dL
SPECIFIC GRAVITY, URINE: 1.015 (ref 1.005–1.030)
pH: 5 (ref 5.0–8.0)

## 2016-05-11 LAB — COMPREHENSIVE METABOLIC PANEL
ALK PHOS: 81 U/L (ref 38–126)
ALT: 14 U/L — AB (ref 17–63)
ANION GAP: 11 (ref 5–15)
AST: 15 U/L (ref 15–41)
Albumin: 3.3 g/dL — ABNORMAL LOW (ref 3.5–5.0)
BUN: 35 mg/dL — ABNORMAL HIGH (ref 6–20)
CALCIUM: 9.2 mg/dL (ref 8.9–10.3)
CO2: 27 mmol/L (ref 22–32)
CREATININE: 2.02 mg/dL — AB (ref 0.61–1.24)
Chloride: 86 mmol/L — ABNORMAL LOW (ref 101–111)
GFR calc Af Amer: 39 mL/min — ABNORMAL LOW (ref >60)
GFR, EST NON AFRICAN AMERICAN: 33 mL/min — AB (ref >60)
Glucose, Bld: 598 mg/dL (ref 65–99)
Potassium: 4 mmol/L (ref 3.5–5.1)
Sodium: 124 mmol/L — ABNORMAL LOW (ref 135–145)
TOTAL PROTEIN: 9.6 g/dL — AB (ref 6.5–8.1)
Total Bilirubin: 0.4 mg/dL (ref 0.3–1.2)

## 2016-05-11 LAB — CBG MONITORING, ED
GLUCOSE-CAPILLARY: 553 mg/dL — AB (ref 65–99)
Glucose-Capillary: 461 mg/dL — ABNORMAL HIGH (ref 65–99)
Glucose-Capillary: 432 mg/dL — ABNORMAL HIGH (ref 65–99)

## 2016-05-11 MED ORDER — SODIUM CHLORIDE 0.9 % IV BOLUS (SEPSIS)
1000.0000 mL | Freq: Once | INTRAVENOUS | Status: AC
  Administered 2016-05-11: 1000 mL via INTRAVENOUS

## 2016-05-11 MED ORDER — LOPERAMIDE HCL 2 MG PO CAPS: 4.0000 mg | ORAL_CAPSULE | ORAL | Status: DC | PRN

## 2016-05-11 MED ORDER — INSULIN GLARGINE 100 UNIT/ML ~~LOC~~ SOLN
55.0000 [IU] | Freq: Once | SUBCUTANEOUS | Status: AC
  Administered 2016-05-11: 55 [IU] via SUBCUTANEOUS
  Filled 2016-05-11: qty 0.55

## 2016-05-11 MED ORDER — LOPERAMIDE HCL 2 MG PO CAPS
2.0000 mg | ORAL_CAPSULE | Freq: Once | ORAL | Status: AC
  Administered 2016-05-11: 2 mg via ORAL
  Filled 2016-05-11: qty 1

## 2016-05-11 MED ORDER — INSULIN ASPART 100 UNIT/ML ~~LOC~~ SOLN
15.0000 [IU] | Freq: Once | SUBCUTANEOUS | Status: AC
  Administered 2016-05-11: 15 [IU] via SUBCUTANEOUS
  Filled 2016-05-11: qty 1

## 2016-05-11 NOTE — ED Notes (Signed)
Patient has had 4 episodes of diarrhea while in ED. Verbal order to give patient Imodium.

## 2016-05-11 NOTE — Discharge Instructions (Signed)
Make sure you get your insulin, tomorrow, as you plan.  Drink  2L of water each day to help decrease your sugar and improve your dehydration.

## 2016-05-11 NOTE — ED Triage Notes (Signed)
Diarrhea x2-3 days. [facility]G read "High".

## 2016-05-11 NOTE — ED Notes (Signed)
Patient given water and crackers. 

## 2016-05-11 NOTE — ED Provider Notes (Signed)
[facility] DEPT Provider Note   CSN: [id number] Arrival date & time: 05/11/16  1247   By signing my name below, I, Eunice Blase, attest that this documentation has been prepared under the direction and in the presence of Daleen Bo, MD. Electronically signed, Eunice Blase, ED Scribe. 05/11/16. 1:54 PM.   History   Chief Complaint Chief Complaint  Patient presents with  . Diarrhea  . Hyperglycemia   The history is provided by the patient and medical records. No language interpreter was used.    HPI Comments: Cory Weber is a 65 y.o. male with Hx of DM who presents to the Emergency Department complaining of diarrhea x 3 days. He reports diarrhea 3-4 times daily. He notes decreased appetite, constipation leading up to this event and R lower leg pain. Pt currently in rehab for a L leg amputation that had to be performed because of clogged arteries in the extremity following a back surgery. Pt denies bloody stool, nausea, vomiting and fever.   Past Medical History:  Diagnosis Date  . Anemia   . Asthma   . BPH (benign prostatic hyperplasia)   . Cellulitis and abscess of foot 07/2015  . Chronic kidney disease    CKD stage 3  . CVA (cerebral vascular accident) ([place]) 2015   denies residual on 08/15/2015  . Hepatitis C    states he was diagnosed in 2007 or 2007 while living in [place], [place]  . Hyperlipidemia   . Hypertension   . Peripheral neuropathy ([place])   . Pneumonia X 1  . PVD (peripheral vascular disease) ([place])   . Type II diabetes mellitus [facility])     Patient Active Problem List   Diagnosis Date Noted  . 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 Dyslipidemia associated with type 2 diabetes mellitus ([place]) 01/21/2016  . Uncontrolled type II diabetes with peripheral autonomic neuropathy ([place]) 01/17/2016  . Weight gain 12/20/2015  . Edema of right lower extremity 12/20/2015  . Cough 11/26/2015  . GERD  (gastroesophageal reflux disease) 10/08/2015  . Amputation of left lower extremity below knee ([place]) 08/21/2015  . Phantom limb pain ([place])   . Anemia of chronic disease   . BPH (benign prostatic hyperplasia)   . ETOH abuse   . PVD (peripheral vascular disease) ([place])   . Chronic hepatitis C without hepatic coma ([place])   . History of CVA (cerebrovascular accident) without residual deficits   . Atherosclerosis of left lower extremity with ulceration of midfoot ([place])   . Chronic kidney disease, stage 3 07/18/2015  . Essential hypertension 07/18/2015  . S/P lumbar spinal fusion 11/25/2013  . Melena 12/03/2011  . Hemiparesthesia 02/03/2011  . Polysubstance abuse 02/03/2011  . Tobacco abuse 02/03/2011    Past Surgical History:  Procedure Laterality Date  . AMPUTATION Left 08/16/2015   Procedure: LEFT BELOW THE KNEE AMPUTATION ;  Surgeon: Serafina Mitchell, MD;  Location: [facility];  Service: Vascular;  Laterality: Left;  . BACK SURGERY    . BELOW KNEE LEG AMPUTATION Left 08/16/2015  . BIOPSY N/A 09/03/2012   Procedure: BIOPSY;  Surgeon: Daneil Dolin, MD;  Location: [facility];  Service: Endoscopy;  Laterality: N/A;  gastric and gastric mucosa  . BIOPSY N/A 12/03/2012   Procedure: BIOPSY;  Surgeon: Daneil Dolin, MD;  Location: [facility];  Service: Endoscopy;  Laterality: N/A;  . COLONOSCOPY WITH PROPOFOL N/A 09/03/2012   DXA:JOINOMV polyp-removed as outlined above. Prominent internal  hemorrhoids. Tubular adenoma  . ESOPHAGOGASTRODUODENOSCOPY (EGD) WITH PROPOFOL N/A 09/03/2012   YIR:SWNIOE hernia. Gastric diverticulum. Gastric ulcers with associated erosions. Duodenal erosions. Status post gastric biopsy. H.PYLORI gastritis   . ESOPHAGOGASTRODUODENOSCOPY (EGD) WITH PROPOFOL N/A 12/03/2012   Dr. Gala Romney: gastric diverticulum, gastric erosions and scar. Previously noted gastric ulcer completed healed. Biopsy without H.pylori.   . ESOPHAGOGASTRODUODENOSCOPY (EGD) WITH PROPOFOL N/A 01/23/2016   Procedure:  ESOPHAGOGASTRODUODENOSCOPY (EGD) WITH PROPOFOL;  Surgeon: Doran Stabler, MD;  Location: [facility];  Service: Gastroenterology;  Laterality: N/A;  . LIVER BIOPSY  2005   Done in [place], [place]. Chronic hepatitis with mild periportal inflammation, lobular unicellular necrosis and portal fibrosis. Grade 2, stage 1-2.  Marland Kitchen MAXIMUM ACCESS (MAS)POSTERIOR LUMBAR INTERBODY FUSION (PLIF) 1 LEVEL N/A 11/25/2013   Procedure: FOR MAXIMUM ACCESS (MAS) POSTERIOR LUMBAR INTERBODY FUSION (PLIF) 1 LEVEL;  Surgeon: Eustace Moore, MD;  Location: [facility];  Service: Neurosurgery;  Laterality: N/A;  FOR MAXIMUM ACCESS (MAS) POSTERIOR LUMBAR INTERBODY FUSION (PLIF) 1 LEVEL LUMBAR 3-4  . PERIPHERAL VASCULAR CATHETERIZATION Left 08/01/2015   Procedure: Lower Extremity Angiography;  Surgeon: Serafina Mitchell, MD;  Location: [facility];  Service: Cardiovascular;  Laterality: Left;  . PERIPHERAL VASCULAR CATHETERIZATION N/A 08/01/2015   Procedure: Abdominal Aortogram;  Surgeon: Serafina Mitchell, MD;  Location: [facility];  Service: Cardiovascular;  Laterality: N/A;  . PERIPHERAL VASCULAR CATHETERIZATION N/A 08/08/2015   Procedure: Abdominal Aortogram w/Lower Extremity;  Surgeon: Serafina Mitchell, MD;  Location: [facility];  Service: Cardiovascular;  Laterality: N/A;  . PERIPHERAL VASCULAR CATHETERIZATION Left 08/08/2015   Procedure: Peripheral Vascular Balloon Angioplasty;  Surgeon: Serafina Mitchell, MD;  Location: [facility];  Service: Cardiovascular;  Laterality: Left;  left popiteal artery, left peronealtrunk, left post tibial  . POLYPECTOMY N/A 09/03/2012   Procedure: POLYPECTOMY;  Surgeon: Daneil Dolin, MD;  Location: [facility];  Service: Endoscopy;  Laterality: N/A;  cecal polyp  . TONSILLECTOMY         Home Medications    Prior to Admission medications   Medication Sig Start Date End Date Taking? Authorizing Provider  carvedilol (COREG) 3.125 MG tablet Take 1 tablet (3.125 mg  total) by mouth 2 (two) times daily with a meal. 08/10/15  Yes Maryann Mikhail, DO  cetirizine (ZYRTEC) 10 MG tablet Take 10 mg by mouth daily.   Yes Historical Provider, MD  cyanocobalamin 500 MCG tablet Take 2 tablets by mouth one time daily   Yes Historical Provider, MD  gabapentin (NEURONTIN) 600 MG tablet Take 600 mg by mouth 3 (three) times daily. Take 1 tablet in Am, 1 tablet in afternoon and 2 tablets at bedtime   Yes Historical Provider, MD  lisinopril (PRINIVIL,ZESTRIL) 5 MG tablet Take 5 mg by mouth daily.    Yes Historical Provider, MD  metFORMIN (GLUCOPHAGE) 500 MG tablet Take 500 mg by mouth daily with breakfast.   Yes Historical Provider, MD  methocarbamol (ROBAXIN) 500 MG tablet Take 500 mg by mouth every 6 (six) hours.   Yes Historical Provider, MD  Multiple Vitamins-Minerals (DECUBI-VITE PO) Take 1 capsule by mouth daily.   Yes Historical Provider, MD  sennosides-docusate sodium (SENOKOT-S) 8.6-50 MG tablet Take 2 tablets by mouth 2 (two) times daily. May take an additional 2 tablets daily as needed for constipation   Yes Historical Provider, MD  simvastatin (ZOCOR) 20 MG tablet Take 20 mg by mouth every evening.    Yes Historical Provider,  MD  tamsulosin (FLOMAX) 0.4 MG CAPS capsule Take 1 capsule (0.4 mg total) by mouth daily. 08/10/15  Yes Maryann Mikhail, DO  thiamine 100 MG tablet Take 1 tablet (100 mg total) by mouth daily. 08/10/15  Yes Maryann Mikhail, DO  torsemide (DEMADEX) 20 MG tablet Take 20 mg by mouth 2 (two) times daily.    Yes Historical Provider, MD  acidophilus (RISAQUAD) CAPS capsule Take 2 capsules by mouth daily. 08/21/15   Clanford Marisa Hua, MD  allopurinol (ZYLOPRIM) 100 MG tablet Give 2 tablet by mouth every evening    Historical Provider, MD  aspirin 325 MG tablet Take 325 mg by mouth daily.    Historical Provider, MD  famotidine (PEPCID) 20 MG tablet Take 1 tablet (20 mg total) by mouth daily. 08/10/15   Maryann Mikhail, DO  ferrous sulfate 325 (65 FE) MG  tablet Take 1 tablet (325 mg total) by mouth 3 (three) times daily with meals. 11/27/15   Hosie Poisson, MD  folic acid (FOLVITE) 1 MG tablet Take 1 tablet (1 mg total) by mouth daily. 08/10/15   Maryann Mikhail, DO  insulin aspart (NOVOLOG) 100 UNIT/ML injection Inject 22 Units into the skin 3 (three) times daily before meals.     Historical Provider, MD  insulin glargine (LANTUS) 100 UNIT/ML injection Inject 55 Units into the skin at bedtime.     Historical Provider, MD  polyethylene glycol (MIRALAX / GLYCOLAX) packet Take 17 g by mouth daily. 08/21/15   Clanford Marisa Hua, MD    Family History Family History  Problem Relation Age of Onset  . Breast cancer Mother   . Heart failure Mother   . Diabetes Father   . Hypertension Father   . Heart disease Father   . Hyperlipidemia Father   . Diabetes Sister   . Hypertension Sister   . Breast cancer Sister   . Colon cancer Neg Hx   . Liver disease Neg Hx     Social History Social History  Substance Use Topics  . Smoking status: Former Smoker    Packs/day: 1.00    Years: 47.00    Types: Cigarettes    Quit date: 07/13/2015  . Smokeless tobacco: Never Used  . Alcohol use No     Comment:  none 08/15/2015 "stopped drinking 3-4 years ago"     Allergies   Patient has no known allergies.   Review of Systems Review of Systems  Constitutional: Positive for appetite change.  Gastrointestinal: Positive for constipation. Negative for blood in stool.  All other systems reviewed and are negative.    Physical Exam Updated Vital Signs BP 92/74   Pulse (!) 122   Temp 97.5 F (36.4 C) (Oral)   Resp (!) 21   Ht 6\' 1"  (1.854 m)   Wt 256 lb (116.1 kg)   SpO2 93%   BMI 33.78 kg/m   Physical Exam  Constitutional: He is oriented to person, place, and time. He appears well-developed and well-nourished.  HENT:  Head: Normocephalic and atraumatic.  Right Ear: External ear normal.  Left Ear: External ear normal.  Eyes: Conjunctivae and EOM  are normal. Pupils are equal, round, and reactive to light.  Neck: Normal range of motion and phonation normal. Neck supple.  Cardiovascular: Normal rate, regular rhythm and normal heart sounds.   Pulmonary/Chest: Effort normal and breath sounds normal. He exhibits no bony tenderness.  Abdominal: Soft. There is no tenderness.  Musculoskeletal: Normal range of motion.  LLE BKA  Neurological: He  is alert and oriented to person, place, and time. No cranial nerve deficit or sensory deficit. He exhibits normal muscle tone. Coordination normal.  Skin: Skin is warm, dry and intact.  Psychiatric: He has a normal mood and affect. His behavior is normal. Judgment and thought content normal.  Nursing note and vitals reviewed.    ED Treatments / Results  DIAGNOSTIC STUDIES: Oxygen Saturation is 93% on RA, low by my interpretation.    COORDINATION OF CARE: 1:32 PM Discussed treatment plan with pt at bedside and pt agreed to plan. Will review labs and reassess.  Labs (all labs ordered are listed, but only abnormal results are displayed) Labs Reviewed  CBG MONITORING, ED - Abnormal; Notable for the following:       Result Value   Glucose-Capillary 553 (*)    All other components within normal limits  URINALYSIS, ROUTINE W REFLEX MICROSCOPIC  COMPREHENSIVE METABOLIC PANEL  CBC WITH DIFFERENTIAL/PLATELET    EKG  EKG Interpretation None       Radiology No results found.  Procedures Procedures (including critical care time)  Medications Ordered in ED Medications  sodium chloride 0.9 % bolus 1,000 mL (1,000 mLs Intravenous New Bag/Given 05/11/16 1342)     Initial Impression / Assessment and Plan / ED Course  I have reviewed the triage vital signs and the nursing notes.  Pertinent labs & imaging results that were available during my care of the patient were reviewed by me and considered in my medical decision making (see chart for details).  Clinical Course as of May 13 1036  Sat  May 11, 2016  2224 The patient now states that he has been out of his insulin since he left rehab, 3 days ago.  He states he can get the medicine tomorrow.  Will be given a dose of his usual nighttime medicine, prior to discharge  [EW]  2226 Patient is currently taking oral fluids easily  [EW]    Clinical Course User Index [EW] Daleen Bo, MD    Medications  sodium chloride 0.9 % bolus 1,000 mL (0 mLs Intravenous Stopped 05/11/16 1648)  sodium chloride 0.9 % bolus 1,000 mL (0 mLs Intravenous Stopped 05/11/16 2211)  loperamide (IMODIUM) capsule 2 mg (2 mg Oral Given 05/11/16 2144)  insulin glargine (LANTUS) injection 55 Units (55 Units Subcutaneous Given 05/11/16 2248)  insulin aspart (novoLOG) injection 15 Units (15 Units Subcutaneous Given 05/11/16 2249)    No data found.   At D/C- Reevaluation with update and discussion. After initial assessment and treatment, an updated evaluation reveals he is comfortable. Findings discussed, questions answered. Nabria Nevin L    Final Clinical Impressions(s) / ED Diagnoses   Final diagnoses:  Hyperglycemia  Noncompliance with medication regimen    Hyperglycemia, d/t medication noncompliance. Nontoxic and normal anion gap. Nonspecific diarrea, doubt SBI.   Nursing Notes Reviewed/ Care Coordinated Applicable Imaging Reviewed Interpretation of Laboratory Data incorporated into ED treatment  The patient appears reasonably screened and/or stabilized for discharge and I doubt any other medical condition or other [facility] requiring further screening, evaluation, or treatment in the ED at this time prior to discharge.  Plan: Home Medications- continue usual, he can pick up Rx tomorrow; Home Treatments- rest; return here if the recommended treatment, does not improve the symptoms; Recommended follow up- PCP 1 week  New Prescriptions New Prescriptions   No medications on file   I personally performed the services described in this documentation, which  was scribed in my presence. The recorded information  has been reviewed and is accurate.       Daleen Bo, MD 05/13/16 1041

## 2016-05-11 NOTE — ED Notes (Signed)
CRITICAL VALUE ALERT  Critical value received:  Glucose 598  Date of notification:  05/11/2016  Time of notification:  5217  Critical value read back yes Nurse who received alert:  Susa Day  MD notified (1st page):  Dr. Eulis Foster

## 2016-05-16 ENCOUNTER — Encounter: Payer: Self-pay | Admitting: Surgery

## 2016-05-21 ENCOUNTER — Other Ambulatory Visit: Payer: Self-pay | Admitting: Orthopedic Surgery

## 2016-05-27 ENCOUNTER — Ambulatory Visit: Payer: Medicaid Other | Admitting: Surgery

## 2016-05-31 ENCOUNTER — Emergency Department (HOSPITAL_COMMUNITY): Payer: Medicaid Other

## 2016-05-31 ENCOUNTER — Inpatient Hospital Stay (HOSPITAL_COMMUNITY)
Admission: EM | Admit: 2016-05-31 | Discharge: 2016-06-04 | DRG: 271 | Disposition: A | Discharge status: Home / Self Care | Payer: Medicaid Other | Attending: Internal Medicine | Admitting: Internal Medicine

## 2016-05-31 ENCOUNTER — Encounter (HOSPITAL_COMMUNITY): Payer: Self-pay | Admitting: Emergency Medicine

## 2016-05-31 DIAGNOSIS — Z833 Family history of diabetes mellitus: Secondary | ICD-10-CM | POA: Diagnosis not present

## 2016-05-31 DIAGNOSIS — E1122 Type 2 diabetes mellitus with diabetic chronic kidney disease: Secondary | ICD-10-CM | POA: Diagnosis present

## 2016-05-31 DIAGNOSIS — N179 Acute kidney failure, unspecified: Secondary | ICD-10-CM | POA: Diagnosis present

## 2016-05-31 DIAGNOSIS — E11621 Type 2 diabetes mellitus with foot ulcer: Secondary | ICD-10-CM | POA: Diagnosis not present

## 2016-05-31 DIAGNOSIS — E871 Hypo-osmolality and hyponatremia: Secondary | ICD-10-CM | POA: Diagnosis present

## 2016-05-31 DIAGNOSIS — I998 Other disorder of circulatory system: Secondary | ICD-10-CM | POA: Diagnosis present

## 2016-05-31 DIAGNOSIS — I70269 Atherosclerosis of native arteries of extremities with gangrene, unspecified extremity: Secondary | ICD-10-CM | POA: Diagnosis not present

## 2016-05-31 DIAGNOSIS — IMO0002 Reserved for concepts with insufficient information to code with codable children: Secondary | ICD-10-CM | POA: Diagnosis present

## 2016-05-31 DIAGNOSIS — N183 Chronic kidney disease, stage 3 unspecified: Secondary | ICD-10-CM | POA: Diagnosis present

## 2016-05-31 DIAGNOSIS — Z8673 Personal history of transient ischemic attack (TIA), and cerebral infarction without residual deficits: Secondary | ICD-10-CM | POA: Diagnosis not present

## 2016-05-31 DIAGNOSIS — I5042 Chronic combined systolic (congestive) and diastolic (congestive) heart failure: Secondary | ICD-10-CM | POA: Diagnosis present

## 2016-05-31 DIAGNOSIS — I10 Essential (primary) hypertension: Secondary | ICD-10-CM | POA: Diagnosis present

## 2016-05-31 DIAGNOSIS — Z89512 Acquired absence of left leg below knee: Secondary | ICD-10-CM

## 2016-05-31 DIAGNOSIS — E785 Hyperlipidemia, unspecified: Secondary | ICD-10-CM | POA: Diagnosis present

## 2016-05-31 DIAGNOSIS — E1165 Type 2 diabetes mellitus with hyperglycemia: Secondary | ICD-10-CM | POA: Diagnosis present

## 2016-05-31 DIAGNOSIS — Z86010 Personal history of colonic polyps: Secondary | ICD-10-CM | POA: Diagnosis not present

## 2016-05-31 DIAGNOSIS — Z803 Family history of malignant neoplasm of breast: Secondary | ICD-10-CM

## 2016-05-31 DIAGNOSIS — I739 Peripheral vascular disease, unspecified: Secondary | ICD-10-CM | POA: Diagnosis present

## 2016-05-31 DIAGNOSIS — I130 Hypertensive heart and chronic kidney disease with heart failure and stage 1 through stage 4 chronic kidney disease, or unspecified chronic kidney disease: Secondary | ICD-10-CM | POA: Diagnosis present

## 2016-05-31 DIAGNOSIS — N400 Enlarged prostate without lower urinary tract symptoms: Secondary | ICD-10-CM | POA: Diagnosis present

## 2016-05-31 DIAGNOSIS — Z8249 Family history of ischemic heart disease and other diseases of the circulatory system: Secondary | ICD-10-CM

## 2016-05-31 DIAGNOSIS — E1143 Type 2 diabetes mellitus with diabetic autonomic (poly)neuropathy: Secondary | ICD-10-CM | POA: Diagnosis present

## 2016-05-31 DIAGNOSIS — Z720 Tobacco use: Secondary | ICD-10-CM | POA: Diagnosis not present

## 2016-05-31 DIAGNOSIS — D72829 Elevated white blood cell count, unspecified: Secondary | ICD-10-CM | POA: Diagnosis not present

## 2016-05-31 DIAGNOSIS — J45909 Unspecified asthma, uncomplicated: Secondary | ICD-10-CM | POA: Diagnosis not present

## 2016-05-31 DIAGNOSIS — I999 Unspecified disorder of circulatory system: Secondary | ICD-10-CM | POA: Diagnosis not present

## 2016-05-31 DIAGNOSIS — M79671 Pain in right foot: Secondary | ICD-10-CM | POA: Diagnosis not present

## 2016-05-31 DIAGNOSIS — E1152 Type 2 diabetes mellitus with diabetic peripheral angiopathy with gangrene: Secondary | ICD-10-CM | POA: Diagnosis present

## 2016-05-31 DIAGNOSIS — N184 Chronic kidney disease, stage 4 (severe): Secondary | ICD-10-CM | POA: Diagnosis not present

## 2016-05-31 DIAGNOSIS — D649 Anemia, unspecified: Secondary | ICD-10-CM | POA: Diagnosis present

## 2016-05-31 DIAGNOSIS — B182 Chronic viral hepatitis C: Secondary | ICD-10-CM | POA: Diagnosis present

## 2016-05-31 DIAGNOSIS — Z87891 Personal history of nicotine dependence: Secondary | ICD-10-CM | POA: Diagnosis not present

## 2016-05-31 DIAGNOSIS — L97519 Non-pressure chronic ulcer of other part of right foot with unspecified severity: Secondary | ICD-10-CM | POA: Diagnosis present

## 2016-05-31 DIAGNOSIS — I70235 Atherosclerosis of native arteries of right leg with ulceration of other part of foot: Secondary | ICD-10-CM | POA: Diagnosis not present

## 2016-05-31 LAB — COMPREHENSIVE METABOLIC PANEL
ALBUMIN: 3.5 g/dL (ref 3.5–5.0)
ALK PHOS: 93 U/L (ref 38–126)
ALT: 16 U/L — AB (ref 17–63)
AST: 12 U/L — ABNORMAL LOW (ref 15–41)
Anion gap: 7 (ref 5–15)
BUN: 21 mg/dL — ABNORMAL HIGH (ref 6–20)
CALCIUM: 9.2 mg/dL (ref 8.9–10.3)
CO2: 27 mmol/L (ref 22–32)
CREATININE: 1.13 mg/dL (ref 0.61–1.24)
Chloride: 96 mmol/L — ABNORMAL LOW (ref 101–111)
GFR calc Af Amer: >60 mL/min (ref >60)
GFR calc non Af Amer: >60 mL/min (ref >60)
Glucose, Bld: 248 mg/dL — ABNORMAL HIGH (ref 65–99)
Potassium: 4.8 mmol/L (ref 3.5–5.1)
SODIUM: 130 mmol/L — AB (ref 135–145)
Total Bilirubin: 0.5 mg/dL (ref 0.3–1.2)
Total Protein: 9.3 g/dL — ABNORMAL HIGH (ref 6.5–8.1)

## 2016-05-31 LAB — CBC WITH DIFFERENTIAL/PLATELET
Basophils Absolute: 0 10*3/uL (ref 0.0–0.1)
Basophils Relative: 0 %
EOS ABS: 0.1 10*3/uL (ref 0.0–0.7)
EOS PCT: 1 %
HCT: 31.7 % — ABNORMAL LOW (ref 39.0–52.0)
HEMOGLOBIN: 9.9 g/dL — AB (ref 13.0–17.0)
LYMPHS ABS: 3 10*3/uL (ref 0.7–4.0)
Lymphocytes Relative: 19 %
MCH: 25.2 pg — AB (ref 26.0–34.0)
MCHC: 31.2 g/dL (ref 30.0–36.0)
MCV: 80.7 fL (ref 78.0–100.0)
MONOS PCT: 6 %
Monocytes Absolute: 1 10*3/uL (ref 0.1–1.0)
NEUTROS PCT: 74 %
Neutro Abs: 11.9 10*3/uL — ABNORMAL HIGH (ref 1.7–7.7)
Platelets: 407 10*3/uL — ABNORMAL HIGH (ref 150–400)
RBC: 3.93 MIL/uL — ABNORMAL LOW (ref 4.22–5.81)
RDW: 16.3 % — ABNORMAL HIGH (ref 11.5–15.5)
WBC: 16 10*3/uL — ABNORMAL HIGH (ref 4.0–10.5)

## 2016-05-31 LAB — GLUCOSE, CAPILLARY: Glucose-Capillary: 265 mg/dL — ABNORMAL HIGH (ref 65–99)

## 2016-05-31 LAB — LIPASE, BLOOD: Lipase: 17 U/L (ref 11–51)

## 2016-05-31 LAB — PROTIME-INR
INR: 1.17
PROTHROMBIN TIME: 15 s (ref 11.4–15.2)

## 2016-05-31 LAB — CBG MONITORING, ED: Glucose-Capillary: 243 mg/dL — ABNORMAL HIGH (ref 65–99)

## 2016-05-31 LAB — LACTIC ACID, PLASMA: Lactic Acid, Venous: 1 mmol/L (ref 0.5–1.9)

## 2016-05-31 MED ORDER — ASPIRIN 325 MG PO TABS
325.0000 mg | ORAL_TABLET | Freq: Every day | ORAL | Status: DC
  Administered 2016-06-01 – 2016-06-04 (×4): 325 mg via ORAL
  Filled 2016-05-31 (×4): qty 1

## 2016-05-31 MED ORDER — ONDANSETRON HCL 4 MG/2ML IJ SOLN: 4.0000 mg | Freq: Four times a day (QID) | INTRAMUSCULAR | Status: DC | PRN

## 2016-05-31 MED ORDER — ACETAMINOPHEN 325 MG PO TABS: 650.0000 mg | ORAL_TABLET | Freq: Four times a day (QID) | ORAL | Status: DC | PRN

## 2016-05-31 MED ORDER — INSULIN ASPART 100 UNIT/ML ~~LOC~~ SOLN
0.0000 [IU] | Freq: Every day | SUBCUTANEOUS | Status: DC
  Administered 2016-05-31: 3 [IU] via SUBCUTANEOUS
  Administered 2016-06-01: 2 [IU] via SUBCUTANEOUS
  Administered 2016-06-02: 4 [IU] via SUBCUTANEOUS
  Administered 2016-06-03: 3 [IU] via SUBCUTANEOUS

## 2016-05-31 MED ORDER — HYDROMORPHONE HCL 1 MG/ML IJ SOLN
1.0000 mg | Freq: Once | INTRAMUSCULAR | Status: AC
  Administered 2016-05-31: 1 mg via INTRAVENOUS
  Filled 2016-05-31: qty 1

## 2016-05-31 MED ORDER — INSULIN ASPART 100 UNIT/ML ~~LOC~~ SOLN
0.0000 [IU] | Freq: Three times a day (TID) | SUBCUTANEOUS | Status: DC
  Administered 2016-06-01 – 2016-06-02 (×4): 3 [IU] via SUBCUTANEOUS
  Administered 2016-06-02: 5 [IU] via SUBCUTANEOUS
  Administered 2016-06-02 – 2016-06-04 (×4): 3 [IU] via SUBCUTANEOUS
  Administered 2016-06-04: 5 [IU] via SUBCUTANEOUS

## 2016-05-31 MED ORDER — MORPHINE SULFATE (PF) 2 MG/ML IV SOLN
2.0000 mg | INTRAVENOUS | Status: DC | PRN
  Administered 2016-05-31: 2 mg via INTRAVENOUS
  Filled 2016-05-31: qty 1

## 2016-05-31 MED ORDER — ONDANSETRON HCL 4 MG PO TABS: 4.0000 mg | ORAL_TABLET | Freq: Four times a day (QID) | ORAL | Status: DC | PRN

## 2016-05-31 MED ORDER — ACETAMINOPHEN 650 MG RE SUPP: 650.0000 mg | Freq: Four times a day (QID) | RECTAL | Status: DC | PRN

## 2016-05-31 MED ORDER — POLYETHYLENE GLYCOL 3350 17 G PO PACK
17.0000 g | PACK | Freq: Every day | ORAL | Status: DC
  Administered 2016-06-01 – 2016-06-04 (×4): 17 g via ORAL
  Filled 2016-05-31 (×5): qty 1

## 2016-05-31 MED ORDER — TORSEMIDE 20 MG PO TABS
20.0000 mg | ORAL_TABLET | Freq: Two times a day (BID) | ORAL | Status: DC
  Administered 2016-06-01 (×2): 20 mg via ORAL
  Filled 2016-05-31 (×2): qty 1

## 2016-05-31 MED ORDER — INSULIN GLARGINE 100 UNIT/ML ~~LOC~~ SOLN
15.0000 [IU] | Freq: Every day | SUBCUTANEOUS | Status: DC
  Administered 2016-05-31 – 2016-06-03 (×4): 15 [IU] via SUBCUTANEOUS
  Filled 2016-05-31 (×4): qty 0.15

## 2016-05-31 MED ORDER — GABAPENTIN 600 MG PO TABS
600.0000 mg | ORAL_TABLET | Freq: Two times a day (BID) | ORAL | Status: DC
  Administered 2016-06-01 – 2016-06-04 (×6): 600 mg via ORAL
  Filled 2016-05-31 (×7): qty 1

## 2016-05-31 MED ORDER — SODIUM CHLORIDE 0.9 % IV SOLN: INTRAVENOUS | Status: DC

## 2016-05-31 MED ORDER — ALLOPURINOL 100 MG PO TABS
200.0000 mg | ORAL_TABLET | Freq: Every day | ORAL | Status: DC
  Administered 2016-06-01 – 2016-06-04 (×4): 200 mg via ORAL
  Filled 2016-05-31 (×4): qty 2

## 2016-05-31 MED ORDER — SODIUM CHLORIDE 0.9 % IV SOLN
INTRAVENOUS | Status: DC
  Administered 2016-05-31 – 2016-06-03 (×7): via INTRAVENOUS

## 2016-05-31 MED ORDER — LISINOPRIL 5 MG PO TABS
5.0000 mg | ORAL_TABLET | Freq: Every day | ORAL | Status: DC
  Administered 2016-06-01: 5 mg via ORAL
  Filled 2016-05-31: qty 1

## 2016-05-31 MED ORDER — SODIUM CHLORIDE 0.9 % IV BOLUS (SEPSIS)
250.0000 mL | Freq: Once | INTRAVENOUS | Status: AC
  Administered 2016-05-31: 250 mL via INTRAVENOUS

## 2016-05-31 MED ORDER — ENOXAPARIN SODIUM 40 MG/0.4ML ~~LOC~~ SOLN
40.0000 mg | SUBCUTANEOUS | Status: DC
  Administered 2016-06-01 – 2016-06-02 (×2): 40 mg via SUBCUTANEOUS
  Filled 2016-05-31 (×2): qty 0.4

## 2016-05-31 MED ORDER — TAMSULOSIN HCL 0.4 MG PO CAPS
0.4000 mg | ORAL_CAPSULE | Freq: Every day | ORAL | Status: DC
  Administered 2016-06-01 – 2016-06-04 (×4): 0.4 mg via ORAL
  Filled 2016-05-31 (×4): qty 1

## 2016-05-31 MED ORDER — ONDANSETRON HCL 4 MG/2ML IJ SOLN
4.0000 mg | Freq: Once | INTRAMUSCULAR | Status: AC
  Administered 2016-05-31: 4 mg via INTRAVENOUS
  Filled 2016-05-31: qty 2

## 2016-05-31 MED ORDER — CARVEDILOL 3.125 MG PO TABS
3.1250 mg | ORAL_TABLET | Freq: Two times a day (BID) | ORAL | Status: DC
  Administered 2016-06-01 – 2016-06-04 (×7): 3.125 mg via ORAL
  Filled 2016-05-31 (×7): qty 1

## 2016-05-31 MED ORDER — GABAPENTIN 600 MG PO TABS
1200.0000 mg | ORAL_TABLET | Freq: Every day | ORAL | Status: DC
  Administered 2016-05-31 – 2016-06-03 (×4): 1200 mg via ORAL
  Filled 2016-05-31 (×4): qty 2

## 2016-05-31 MED ORDER — SIMVASTATIN 20 MG PO TABS
20.0000 mg | ORAL_TABLET | Freq: Every evening | ORAL | Status: DC
  Administered 2016-06-01 – 2016-06-03 (×4): 20 mg via ORAL
  Filled 2016-05-31 (×4): qty 1

## 2016-05-31 MED ORDER — FERROUS SULFATE 325 (65 FE) MG PO TABS
325.0000 mg | ORAL_TABLET | Freq: Every day | ORAL | Status: DC
  Administered 2016-06-01 – 2016-06-04 (×4): 325 mg via ORAL
  Filled 2016-05-31 (×4): qty 1

## 2016-05-31 NOTE — ED Provider Notes (Signed)
[facility] DEPT Provider Note   CSN: [id number] Arrival date & time: 05/31/16  1559     History   Chief Complaint Chief Complaint  Patient presents with  . Circulatory Problem    HPI SELBY FOISY is a 66 y.o. male.  The patient brought in by EMS from home. Patient has known peripheral vascular disease chronic kidney disease stage IV. Patient history of hepatitis C. Hypertension and diabetes. Patient status post left BKA in July 2017 by vascular surgery at [facility]. Patient presents today with a complaint of right foot pain and peeling of the skin on top of his toes. Patient states that the pain has been present for 1 week. Patient denies any fevers chest pain or shortness of breath. Pain in the right foot is been constant.      Past Medical History:  Diagnosis Date  . Anemia   . Asthma   . BPH (benign prostatic hyperplasia)   . Cellulitis and abscess of foot 07/2015  . Chronic kidney disease    CKD stage 3  . CVA (cerebral vascular accident) ([place]) 2015   denies residual on 08/15/2015  . Hepatitis C    states he was diagnosed in 2007 or 2007 while living in [place], [place]  . Hyperlipidemia   . Hypertension   . Peripheral neuropathy   . Pneumonia X 1  . PVD (peripheral vascular disease) ([place])   . Type II diabetes mellitus [facility])     Patient Active Problem List   Diagnosis Date Noted  . 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 Dyslipidemia associated with type 2 diabetes mellitus ([place]) 01/21/2016  . Uncontrolled type II diabetes with peripheral autonomic neuropathy ([place]) 01/17/2016  . Weight gain 12/20/2015  . Edema of right lower extremity 12/20/2015  . Cough 11/26/2015  . GERD (gastroesophageal reflux disease) 10/08/2015  . Amputation of left lower extremity below knee ([place]) 08/21/2015  . Phantom limb pain ([place])   . Anemia of chronic disease   . BPH (benign prostatic hyperplasia)   . ETOH abuse   . PVD  (peripheral vascular disease) ([place])   . Chronic hepatitis C without hepatic coma ([place])   . History of CVA (cerebrovascular accident) without residual deficits   . Atherosclerosis of left lower extremity with ulceration of midfoot ([place])   . Chronic kidney disease, stage 3 07/18/2015  . Essential hypertension 07/18/2015  . S/P lumbar spinal fusion 11/25/2013  . Melena 12/03/2011  . Hemiparesthesia 02/03/2011  . Polysubstance abuse 02/03/2011  . Tobacco abuse 02/03/2011    Past Surgical History:  Procedure Laterality Date  . AMPUTATION Left 08/16/2015   Procedure: LEFT BELOW THE KNEE AMPUTATION ;  Surgeon: Serafina Mitchell, MD;  Location: [facility];  Service: Vascular;  Laterality: Left;  . BACK SURGERY    . BELOW KNEE LEG AMPUTATION Left 08/16/2015  . BIOPSY N/A 09/03/2012   Procedure: BIOPSY;  Surgeon: Daneil Dolin, MD;  Location: [facility];  Service: Endoscopy;  Laterality: N/A;  gastric and gastric mucosa  . BIOPSY N/A 12/03/2012   Procedure: BIOPSY;  Surgeon: Daneil Dolin, MD;  Location: [facility];  Service: Endoscopy;  Laterality: N/A;  . COLONOSCOPY WITH PROPOFOL N/A 09/03/2012   WUJ:WJXBJYN polyp-removed as outlined above. Prominent internal hemorrhoids. Tubular adenoma  . ESOPHAGOGASTRODUODENOSCOPY (EGD) WITH PROPOFOL N/A 09/03/2012   WGN:FAOZHY hernia. Gastric diverticulum. Gastric ulcers with associated erosions. Duodenal erosions. Status post gastric biopsy. H.PYLORI gastritis   .  ESOPHAGOGASTRODUODENOSCOPY (EGD) WITH PROPOFOL N/A 12/03/2012   Dr. Gala Romney: gastric diverticulum, gastric erosions and scar. Previously noted gastric ulcer completed healed. Biopsy without H.pylori.   . ESOPHAGOGASTRODUODENOSCOPY (EGD) WITH PROPOFOL N/A 01/23/2016   Procedure: ESOPHAGOGASTRODUODENOSCOPY (EGD) WITH PROPOFOL;  Surgeon: Doran Stabler, MD;  [facility]: [facility];  Service: Gastroenterology;  Laterality: N/A;  . LIVER BIOPSY  2005   Done in [place], [place]. Chronic hepatitis with mild periportal  inflammation, lobular unicellular necrosis and portal fibrosis. Grade 2, stage 1-2.  Marland Kitchen MAXIMUM ACCESS (MAS)POSTERIOR LUMBAR INTERBODY FUSION (PLIF) 1 LEVEL N/A 11/25/2013   Procedure: FOR MAXIMUM ACCESS (MAS) POSTERIOR LUMBAR INTERBODY FUSION (PLIF) 1 LEVEL;  Surgeon: Eustace Moore, MD;  Location: [facility];  Service: Neurosurgery;  Laterality: N/A;  FOR MAXIMUM ACCESS (MAS) POSTERIOR LUMBAR INTERBODY FUSION (PLIF) 1 LEVEL LUMBAR 3-4  . PERIPHERAL VASCULAR CATHETERIZATION Left 08/01/2015   Procedure: Lower Extremity Angiography;  Surgeon: Serafina Mitchell, MD;  Location: [facility];  Service: Cardiovascular;  Laterality: Left;  . PERIPHERAL VASCULAR CATHETERIZATION N/A 08/01/2015   Procedure: Abdominal Aortogram;  Surgeon: Serafina Mitchell, MD;  Location: [facility];  Service: Cardiovascular;  Laterality: N/A;  . PERIPHERAL VASCULAR CATHETERIZATION N/A 08/08/2015   Procedure: Abdominal Aortogram w/Lower Extremity;  Surgeon: Serafina Mitchell, MD;  Location: [facility];  Service: Cardiovascular;  Laterality: N/A;  . PERIPHERAL VASCULAR CATHETERIZATION Left 08/08/2015   Procedure: Peripheral Vascular Balloon Angioplasty;  Surgeon: Serafina Mitchell, MD;  Location: [facility];  Service: Cardiovascular;  Laterality: Left;  left popiteal artery, left peronealtrunk, left post tibial  . POLYPECTOMY N/A 09/03/2012   Procedure: POLYPECTOMY;  Surgeon: Daneil Dolin, MD;  [facility];  Service: Endoscopy;  Laterality: N/A;  cecal polyp  . TONSILLECTOMY         Home Medications    Prior to Admission medications   Medication Sig Start Date End Date Taking? Authorizing Provider  allopurinol (ZYLOPRIM) 100 MG tablet Give 2 tablet by mouth every evening   Yes Historical Provider, MD  carvedilol (COREG) 3.125 MG tablet Take 1 tablet (3.125 mg total) by mouth 2 (two) times daily with a meal. 08/10/15  Yes Maryann Mikhail, DO  gabapentin (NEURONTIN) 600 MG tablet Take 600-1,200 mg  by mouth 3 (three) times daily. Take one tablet twice daily and take 2 tablets at bedtime   Yes Historical Provider, MD  insulin aspart (NOVOLOG) 100 UNIT/ML injection Inject 22 Units into the skin 3 (three) times daily before meals.    Yes Historical Provider, MD  insulin glargine (LANTUS) 100 UNIT/ML injection Inject 55 Units into the skin at bedtime.    Yes Historical Provider, MD  lisinopril (PRINIVIL,ZESTRIL) 5 MG tablet Take 5 mg by mouth daily.    Yes Historical Provider, MD  metFORMIN (GLUCOPHAGE) 500 MG tablet Take 500 mg by mouth daily with breakfast.   Yes Historical Provider, MD  methocarbamol (ROBAXIN) 500 MG tablet Take 500 mg by mouth every 6 (six) hours.   Yes Historical Provider, MD  simvastatin (ZOCOR) 20 MG tablet Take 20 mg by mouth every evening.    Yes Historical Provider, MD  tamsulosin (FLOMAX) 0.4 MG CAPS capsule Take 1 capsule (0.4 mg total) by mouth daily. 08/10/15  Yes Maryann Mikhail, DO  torsemide (DEMADEX) 20 MG tablet Take 20 mg by mouth 2 (two) times daily.    Yes Historical Provider, MD  acidophilus (RISAQUAD) CAPS capsule Take 2 capsules by mouth daily.  08/21/15   [place]an[place] Marisa Hua, MD  aspirin 325 MG tablet Take 325 mg by mouth daily.    Historical Provider, MD  cetirizine (ZYRTEC) 10 MG tablet Take 10 mg by mouth daily.    Historical Provider, MD  cyanocobalamin 500 MCG tablet Take 2 tablets by mouth one time daily    Historical Provider, MD  famotidine (PEPCID) 20 MG tablet Take 1 tablet (20 mg total) by mouth daily. 08/10/15   Maryann Mikhail, DO  ferrous sulfate 325 (65 FE) MG tablet Take 1 tablet (325 mg total) by mouth 3 (three) times daily with meals. 11/27/15   Hosie Poisson, MD  folic acid (FOLVITE) 1 MG tablet Take 1 tablet (1 mg total) by mouth daily. 08/10/15   Maryann Mikhail, DO  Multiple Vitamins-Minerals (DECUBI-VITE PO) Take 1 capsule by mouth daily.    Historical Provider, MD  polyethylene glycol (MIRALAX / GLYCOLAX) packet Take 17 g by mouth daily.  08/21/15   Clan[place] Marisa Hua, MD  sennosides-docusate sodium (SENOKOT-S) 8.6-50 MG tablet Take 2 tablets by mouth 2 (two) times daily. May take an additional 2 tablets daily as needed for constipation    Historical Provider, MD  thiamine 100 MG tablet Take 1 tablet (100 mg total) by mouth daily. 08/10/15   Cristal Ford, DO    Family History Family History  Problem Relation Age of Onset  . Breast cancer Mother   . Heart failure Mother   . Diabetes Father   . Hypertension Father   . Heart disease Father   . Hyperlipidemia Father   . Diabetes Sister   . Hypertension Sister   . Breast cancer Sister   . Colon cancer Neg Hx   . Liver disease Neg Hx     Social History Social History  Substance Use Topics  . Smoking status: Former Smoker    Packs/day: 1.00    Years: 47.00    Types: Cigarettes    Quit date: 07/13/2015  . Smokeless tobacco: Never Used  . Alcohol use No     Comment:  none 08/15/2015 "stopped drinking 3-4 years ago"     Allergies   Patient has no known allergies.   Review of Systems Review of Systems  Constitutional: Negative for fever.  HENT: Negative for congestion.   Eyes: Negative for redness.  Respiratory: Negative for shortness of breath.   Cardiovascular: Negative for chest pain.  Gastrointestinal: Negative for abdominal pain, nausea and vomiting.  Genitourinary: Negative for dysuria.  Musculoskeletal: Negative for back pain.  Skin: Positive for wound.  Neurological: Negative for headaches.  Hematological: Does not bruise/bleed easily.  Psychiatric/Behavioral: Negative for confusion.     Physical Exam Updated Vital Signs BP 132/82 (BP Location: Left Arm)   Pulse 92   Temp 98.1 F (36.7 C) (Oral)   Resp 18   Ht 6\' 1"  (1.854 m)   Wt 116.1 kg   SpO2 97%   BMI 33.78 kg/m   Physical Exam  Constitutional: He is oriented to person, place, and time. He appears well-developed and well-nourished. No distress.  HENT:  Head: Normocephalic and  atraumatic.  Mouth/Throat: Oropharynx is clear and moist.  Eyes: EOM are normal.  Neck: Normal range of motion. Neck supple.  Cardiovascular: Normal rate, regular rhythm and normal heart sounds.   Pulmonary/Chest: Effort normal and breath sounds normal. No respiratory distress.  Abdominal: Soft. Bowel sounds are normal. There is no tenderness.  Musculoskeletal: Normal range of motion. He exhibits edema and tenderness.  Normal left  leg below the knee" healthy stump. Right leg foot distal forefoot and toes with erythema tenderness and some blackening of all toes. Some open wounds on the anterior part of some of the toes no purulent discharge. Patient stated that the skin peeled off spontaneously. Refill not normal.  Neurological: He is alert and oriented to person, place, and time. A sensory deficit is present. No cranial nerve deficit. He exhibits normal muscle tone. Coordination normal.  Skin: Skin is warm.  Nursing note and vitals reviewed.    ED Treatments / Results  Labs (all labs ordered are listed, but only abnormal results are displayed) Labs Reviewed  COMPREHENSIVE METABOLIC PANEL - Abnormal; Notable for the following:       Result Value   Sodium 130 (*)    Chloride 96 (*)    Glucose, Bld 248 (*)    BUN 21 (*)    Total Protein 9.3 (*)    AST 12 (*)    ALT 16 (*)    All other components within normal limits  CBC WITH DIFFERENTIAL/PLATELET - Abnormal; Notable for the following:    WBC 16.0 (*)    RBC 3.93 (*)    Hemoglobin 9.9 (*)    HCT 31.7 (*)    MCH 25.2 (*)    RDW 16.3 (*)    Platelets 407 (*)    Neutro Abs 11.9 (*)    All other components within normal limits  CBG MONITORING, ED - Abnormal; Notable for the following:    Glucose-Capillary 243 (*)    All other components within normal limits  LIPASE, BLOOD  PROTIME-INR  LACTIC ACID, PLASMA   Results for orders placed or performed during the hospital encounter of 05/31/16  Comprehensive metabolic panel  Result  Value Ref Range   Sodium 130 (L) 135 - 145 mmol/L   Potassium 4.8 3.5 - 5.1 mmol/L   Chloride 96 (L) 101 - 111 mmol/L   CO2 27 22 - 32 mmol/L   Glucose, Bld 248 (H) 65 - 99 mg/dL   BUN 21 (H) 6 - 20 mg/dL   Creatinine, Ser 1.13 0.61 - 1.24 mg/dL   Calcium 9.2 8.9 - 10.3 mg/dL   Total Protein 9.3 (H) 6.5 - 8.1 g/dL   Albumin 3.5 3.5 - 5.0 g/dL   AST 12 (L) 15 - 41 U/L   ALT 16 (L) 17 - 63 U/L   Alkaline Phosphatase 93 38 - 126 U/L   Total Bilirubin 0.5 0.3 - 1.2 mg/dL   GFR calc non Af Amer >60 >60 mL/min   GFR calc Af Amer >60 >60 mL/min   Anion gap 7 5 - 15  Lipase, blood  Result Value Ref Range   Lipase 17 11 - 51 U/L  CBC with Differential/Platelet  Result Value Ref Range   WBC 16.0 (H) 4.0 - 10.5 K/uL   RBC 3.93 (L) 4.22 - 5.81 MIL/uL   Hemoglobin 9.9 (L) 13.0 - 17.0 g/dL   HCT 31.7 (L) 39.0 - 52.0 %   MCV 80.7 78.0 - 100.0 fL   MCH 25.2 (L) 26.0 - 34.0 pg   MCHC 31.2 30.0 - 36.0 g/dL   RDW 16.3 (H) 11.5 - 15.5 %   Platelets 407 (H) 150 - 400 K/uL   Neutrophils Relative % 74 %   Neutro Abs 11.9 (H) 1.7 - 7.7 K/uL   Lymphocytes Relative 19 %   Lymphs Abs 3.0 0.7 - 4.0 K/uL   Monocytes Relative 6 %  Monocytes Absolute 1.0 0.1 - 1.0 K/uL   Eosinophils Relative 1 %   Eosinophils Absolute 0.1 0.0 - 0.7 K/uL   Basophils Relative 0 %   Basophils Absolute 0.0 0.0 - 0.1 K/uL  Protime-INR  Result Value Ref Range   Prothrombin Time 15.0 11.4 - 15.2 seconds   INR 1.17   Lactic acid, plasma  Result Value Ref Range   Lactic Acid, Venous 1.0 0.5 - 1.9 mmol/L  CBG monitoring, ED  Result Value Ref Range   Glucose-Capillary 243 (H) 65 - 99 mg/dL    EKG  EKG Interpretation  Date/Time:  Friday May 31 2016 16:57:55 EDT Ventricular Rate:  91 PR Interval:    QRS Duration: 92 QT Interval:  359 QTC Calculation: 442 R Axis:   45 Text Interpretation:  Sinus rhythm Abnormal inferior Q waves Confirmed by Rogene Houston  MD, Zaun ([id number]) on 05/31/2016 5:05:02 PM        Radiology Dg Chest 2 View  Result Date: 05/31/2016 CLINICAL DATA:  right foot pain. Right great and 2nd toes are black and hard with no cap refill, rt lower leg pain. Patient complains of pain x 1 week after catching his foot in a string in his blanket. History of DM, HTN, CKD, PVD, CVA. EXAM: CHEST  2 VIEW COMPARISON:  11/25/2015 FINDINGS: The heart size and mediastinal contours are within normal limits. Both lungs are clear. No pleural effusion or pneumothorax. The skeletal structures are intact. IMPRESSION: No active cardiopulmonary disease. Electronically Signed   By: Lajean Manes M.D.   On: 05/31/2016 18:26   Dg Foot Complete Right  Result Date: 05/31/2016 CLINICAL DATA:  right foot pain. Right great and 2nd toes are black and hard with no cap refill, rt lower leg pain. Patient complains of pain x 1 week after catching his foot in a string in his blanket. History of DM, HTN, CKD, PVD, CVA. EXAM: RIGHT FOOT COMPLETE - 3+ VIEW COMPARISON:  None. FINDINGS: No fracture. No bone lesion. No bone resorption is seen to suggest osteomyelitis. There is minor asymmetric joint space narrowing at the first metatarsophalangeal joint. Small marginal osteophytes are also noted. No discrete erosions. Remaining joints are normally spaced and aligned. No soft tissue air.  There are vascular calcifications. IMPRESSION: 1. No fracture or dislocation. 2. No evidence of osteomyelitis.  No soft tissue air. 3. Minor first metatarsophalangeal joint osteoarthritis. Electronically Signed   By: Lajean Manes M.D.   On: 05/31/2016 18:25    Procedures Procedures (including critical care time)  Medications Ordered in ED Medications  0.9 %  sodium chloride infusion ( Intravenous New Bag/Given 05/31/16 1724)  sodium chloride 0.9 % bolus 250 mL (0 mLs Intravenous Stopped 05/31/16 1724)  HYDROmorphone (DILAUDID) injection 1 mg (1 mg Intravenous Given 05/31/16 1645)  ondansetron (ZOFRAN) injection 4 mg (4 mg Intravenous Given  05/31/16 1645)     Initial Impression / Assessment and Plan / ED Course  I have reviewed the triage vital signs and the nursing notes.  Pertinent labs & imaging results that were available during my care of the patient were reviewed by me and considered in my medical decision making (see chart for details).     Patient status post left BKA done by vascular surgery at [facility] July 2017. Patient presents today with right foot pain and ischemia. Some blackened toes. X-ray showed no evidence of any gas gangrene. Labs without significant abnormalities other than hyperglycemia is known to be diabetic.  Patient will require admission  for this ischemia. Consult call out to vascular surgery. Also consult call out to hospitalist.  Final Clinical Impressions(s) / ED Diagnoses   Final diagnoses:  Ischemia of foot    New Prescriptions New Prescriptions   No medications on file     Fredia Sorrow, MD 05/31/16 [phone]

## 2016-05-31 NOTE — H&P (Signed)
History and Physical  Cory Weber:[id number] DOB: 1950/09/08 DOA: 05/31/2016   PCP: Maggie Font, MD   Patient coming from: Home  Chief Complaint: right foot pain  HPI:  Cory Weber is a 66 y.o. male with medical history of peripheral vascular disease, hypertension, diabetes mellitus, stroke, chronic hepatitis C, systolic CHF presents with one-week history of right foot pain that has worsened significantly in the past 4 days. On 05/28/2016, the patient noticed some bleeding and drainage about his first and second toes which he felt the skin was denuded from some "string" on his comforter.  He continued to have worsening pain and noted some yellow drainage between his first and second right toes.  As a result, he presented to the ED for further evaluation.  He denies any fever, chills, cp, sob, n/v/d, abdominal pain, Hematochezia, melena hematochezia, melena.  The patient has not been able to bear weight on his right foot because of increasing pain. Marland Kitchen  He had a left BKA on 08/17/15 after a failed revascularization attempt.  He recently went home from a SNF about one month ago.  Since then, he has run out of some of his medications.  Therefore, he has not taken his insulin or aspirin for a few weeks.  He does not know all his medications, but there are a number of meds he did not recognize when I went through his list with him.  He denies any recent injury or trauma to his right foot. For the past 4 days, he has been only able to transfer from his bed to the commode because of pain.   In the emergency department, the patient was afebrile and hemodynamically stable since 2097% on room air. BMP was essentially unremarkable except for sodium of 1:30. Hepatic enzymes were unremarkable. WBC was 16.0, hemoglobin 9.9, platelets 470,000. Lactic acid was 1.0. Chest x-ray was negative. X-ray of the right foot was negative for fracture, dislocation, or soft tissue air.  Assessment/Plan: Ischemia  of the right foot  -Patient presented with increasing pain in his right foot for the past week noting increasing dDuskiness and pain over the past 4 days duskiness about his toes -transfer to [name] for vascular surgery evaluation -IV morphine prn pain -restart ASA -concerned about salvagability of right foot -VVS was consulted by ED--Dr. Donzetta Matters aware of consult  Peripheral vascular disease  -The patient stopped taking his aspirin for the last 2-3 weeks  -Status post left BKA 08/17/2015--Dr. Brabham  Leukocytosis -likely stress demargination -he is afebrile and hemodynamically stable -will not start abx -UA and culture -blood culture x 2 -CXR--neg  Diabetes Mellitus type 2--uncontrolled with neuropathy -pt has been out of insulin for past 2-3 weeks -restart low dose Lantus as he will be NPO after MN -novolog sliding scale -continue gabapentin -holding metformin  HTN -restart lisinopril and carvedilol  Hyperlipidemia -continue statin  Chronic systolic and diastolic CHF -06/16/36 Echo--EF 50-55%, grade 2 DD -08/03/15 Echo EF 45-50% -clinically euvolemic -continue home dose torsemide and carvedilol         Past Medical History:  Diagnosis Date  . Anemia   . Asthma   . BPH (benign prostatic hyperplasia)   . Cellulitis and abscess of foot 07/2015  . Chronic kidney disease    CKD stage 3  . CVA (cerebral vascular accident) ([place]) 2015   denies residual on 08/15/2015  . Hepatitis C    states he was diagnosed in 2007 or 2007  while living in [place], [place]  . Hyperlipidemia   . Hypertension   . Peripheral neuropathy   . Pneumonia X 1  . PVD (peripheral vascular disease) ([place])   . Type II diabetes mellitus ([place])    Past Surgical History:  Procedure Laterality Date  . AMPUTATION Left 08/16/2015   Procedure: LEFT BELOW THE KNEE AMPUTATION ;  Surgeon: Serafina Mitchell, MD;  Location: [facility];  Service: Vascular;  Laterality: Left;  . BACK SURGERY    . BELOW KNEE LEG  AMPUTATION Left 08/16/2015  . BIOPSY N/A 09/03/2012   Procedure: BIOPSY;  Surgeon: Daneil Dolin, MD;  Location: [facility];  Service: Endoscopy;  Laterality: N/A;  gastric and gastric mucosa  . BIOPSY N/A 12/03/2012   Procedure: BIOPSY;  Surgeon: Daneil Dolin, MD;  Location: [facility];  Service: Endoscopy;  Laterality: N/A;  . COLONOSCOPY WITH PROPOFOL N/A 09/03/2012   TKZ:SWFUXNA polyp-removed as outlined above. Prominent internal hemorrhoids. Tubular adenoma  . ESOPHAGOGASTRODUODENOSCOPY (EGD) WITH PROPOFOL N/A 09/03/2012   TFT:DDUKGU hernia. Gastric diverticulum. Gastric ulcers with associated erosions. Duodenal erosions. Status post gastric biopsy. H.PYLORI gastritis   . ESOPHAGOGASTRODUODENOSCOPY (EGD) WITH PROPOFOL N/A 12/03/2012   Dr. Gala Romney: gastric diverticulum, gastric erosions and scar. Previously noted gastric ulcer completed healed. Biopsy without H.pylori.   . ESOPHAGOGASTRODUODENOSCOPY (EGD) WITH PROPOFOL N/A 01/23/2016   Procedure: ESOPHAGOGASTRODUODENOSCOPY (EGD) WITH PROPOFOL;  Surgeon: Doran Stabler, MD;  Location: [facility];  Service: Gastroenterology;  Laterality: N/A;  . LIVER BIOPSY  2005   Done in [place], [place]. Chronic hepatitis with mild periportal inflammation, lobular unicellular necrosis and portal fibrosis. Grade 2, stage 1-2.  Marland Kitchen MAXIMUM ACCESS (MAS)POSTERIOR LUMBAR INTERBODY FUSION (PLIF) 1 LEVEL N/A 11/25/2013   Procedure: FOR MAXIMUM ACCESS (MAS) POSTERIOR LUMBAR INTERBODY FUSION (PLIF) 1 LEVEL;  Surgeon: Eustace Moore, MD;  Location: [facility];  Service: Neurosurgery;  Laterality: N/A;  FOR MAXIMUM ACCESS (MAS) POSTERIOR LUMBAR INTERBODY FUSION (PLIF) 1 LEVEL LUMBAR 3-4  . PERIPHERAL VASCULAR CATHETERIZATION Left 08/01/2015   Procedure: Lower Extremity Angiography;  Surgeon: Serafina Mitchell, MD;  Location: [facility];  Service: Cardiovascular;  Laterality: Left;  . PERIPHERAL VASCULAR CATHETERIZATION N/A 08/01/2015   Procedure: Abdominal Aortogram;   Surgeon: Serafina Mitchell, MD;  Location: [facility] [facility];  Service: Cardiovascular;  Laterality: N/A;  . PERIPHERAL VASCULAR CATHETERIZATION N/A 08/08/2015   Procedure: Abdominal Aortogram w/Lower Extremity;  Surgeon: Serafina Mitchell, MD;  Location: [facility];  Service: Cardiovascular;  Laterality: N/A;  . PERIPHERAL VASCULAR CATHETERIZATION Left 08/08/2015   Procedure: Peripheral Vascular Balloon Angioplasty;  Surgeon: Serafina Mitchell, MD;  Location: [facility];  Service: Cardiovascular;  Laterality: Left;  left popiteal artery, left peronealtrunk, left post tibial  . POLYPECTOMY N/A 09/03/2012   Procedure: POLYPECTOMY;  Surgeon: Daneil Dolin, MD;  Location: [facility];  Service: Endoscopy;  Laterality: N/A;  cecal polyp  . TONSILLECTOMY     Social History:  reports that he quit smoking about 10 months ago. His smoking use included Cigarettes. He has a 47.00 pack-year smoking history. He has never used smokeless tobacco. He reports that he does not drink alcohol or use drugs.   Family History  Problem Relation Age of Onset  . Breast cancer Mother   . Heart failure Mother   . Diabetes Father   . Hypertension Father   . Heart disease Father   . Hyperlipidemia Father   . Diabetes Sister   .  Hypertension Sister   . Breast cancer Sister   . Colon cancer Neg Hx   . Liver disease Neg Hx      No Known Allergies   Prior to Admission medications   Medication Sig Start Date End Date Taking? Authorizing Provider  allopurinol (ZYLOPRIM) 100 MG tablet Give 2 tablet by mouth every evening   Yes Historical Provider, MD  aspirin 325 MG tablet Take 325 mg by mouth daily.   Yes Historical Provider, MD  carvedilol (COREG) 3.125 MG tablet Take 1 tablet (3.125 mg total) by mouth 2 (two) times daily with a meal. 08/10/15  Yes Maryann Mikhail, DO  ferrous sulfate 325 (65 FE) MG tablet Take 1 tablet (325 mg total) by mouth 3 (three) times daily with meals. Patient taking differently: Take  325 mg by mouth daily.  11/27/15  Yes Hosie Poisson, MD  gabapentin (NEURONTIN) 600 MG tablet Take 600-1,200 mg by mouth 3 (three) times daily. Take one tablet twice daily and take 2 tablets at bedtime   Yes Historical Provider, MD  insulin aspart (NOVOLOG) 100 UNIT/ML injection Inject 22 Units into the skin 3 (three) times daily before meals.    Yes Historical Provider, MD  insulin glargine (LANTUS) 100 UNIT/ML injection Inject 55 Units into the skin at bedtime.    Yes Historical Provider, MD  lisinopril (PRINIVIL,ZESTRIL) 5 MG tablet Take 5 mg by mouth daily.    Yes Historical Provider, MD  magnesium hydroxide (MILK OF MAGNESIA) 400 MG/5ML suspension Take 30 mLs by mouth daily as needed for mild constipation.   Yes Historical Provider, MD  metFORMIN (GLUCOPHAGE) 500 MG tablet Take 500 mg by mouth daily with breakfast.   Yes Historical Provider, MD  methocarbamol (ROBAXIN) 500 MG tablet Take 500 mg by mouth every 6 (six) hours.   Yes Historical Provider, MD  simvastatin (ZOCOR) 20 MG tablet Take 20 mg by mouth every evening.    Yes Historical Provider, MD  tamsulosin (FLOMAX) 0.4 MG CAPS capsule Take 1 capsule (0.4 mg total) by mouth daily. 08/10/15  Yes Maryann Mikhail, DO  torsemide (DEMADEX) 20 MG tablet Take 20 mg by mouth 2 (two) times daily.    Yes Historical Provider, MD    Review of Systems:  Constitutional:  No weight loss, night sweats, Fevers, chills, fatigue.  Head&Eyes: No headache.  No vision loss.  No eye pain or scotoma ENT:  No Difficulty swallowing,Tooth/dental problems,Sore throat,  No ear ache, post nasal drip,  Cardio-vascular:  No chest pain, Orthopnea, PND, swelling in lower extremities,  dizziness, palpitations  GI:  No  abdominal pain, nausea, vomiting, diarrhea, loss of appetite, hematochezia, melena, heartburn, indigestion, Resp:  No shortness of breath with exertion or at rest. No cough. No coughing up of blood .No wheezing.No chest wall deformity  Skin:  no  rash or lesions.  GU:  no dysuria, change in color of urine, no urgency or frequency. No flank pain.  Musculoskeletal: . No decreased range of motion. No back pain.  R-foot pain Psych:  No change in mood or affect. No depression or anxiety. Neurologic: No headache, no dysesthesia, no focal weakness, no vision loss. No syncope  Physical Exam: Vitals:   05/31/16 1745 05/31/16 1915 05/31/16 1916 05/31/16 1918  BP:  120/87    Pulse: 90  (!) 101   Resp:    18  Temp:      TempSrc:      SpO2: 95%  98%   Weight:  Height:       General:  A&O x 3, NAD, nontoxic, pleasant/cooperative Head/Eye: No conjunctival hemorrhage, no icterus, [place]/AT, No nystagmus ENT:  No icterus,  No thrush, good dentition, no pharyngeal exudate Neck:  No masses, no lymphadenpathy, no bruits CV:  RRR, no rub, no gallop, no S3 Lung:  CTAB, good air movement, no wheeze, no rhonchi Abdomen: soft/NT, +BS, nondistended, no peritoneal signs Ext: cyanosis and coolness of right foot;  Duskiness of right 1-3 digits of toe;  No palpable DP or post tib pulse;  L-BKA site without drainage or erythema Neuro: CNII-XII intact, strength 4/5 in bilateral upper and lower extremities, no dysmetria  Labs on Admission:  Basic Metabolic Panel:  Recent Labs Lab 05/31/16 1646  NA 130*  K 4.8  CL 96*  CO2 27  GLUCOSE 248*  BUN 21*  CREATININE 1.13  CALCIUM 9.2   Liver Function Tests:  Recent Labs Lab 05/31/16 1646  AST 12*  ALT 16*  ALKPHOS 93  BILITOT 0.5  PROT 9.3*  ALBUMIN 3.5    Recent Labs Lab 05/31/16 1646  LIPASE 17   No results for input(s): AMMONIA in the last 168 hours. CBC:  Recent Labs Lab 05/31/16 1646  WBC 16.0*  NEUTROABS 11.9*  HGB 9.9*  HCT 31.7*  MCV 80.7  PLT 407*   Coagulation Profile:  Recent Labs Lab 05/31/16 1646  INR 1.17   Cardiac Enzymes: No results for input(s): CKTOTAL, CKMB, CKMBINDEX, TROPONINI in the last 168 hours. BNP: Invalid input(s):  POCBNP CBG:  Recent Labs Lab 05/31/16 1652  GLUCAP 243*   Urine analysis:    Component Value Date/Time   COLORURINE YELLOW 05/11/2016 2128   APPEARANCEUR HAZY (A) 05/11/2016 2128   LABSPEC 1.015 05/11/2016 2128   PHURINE 5.0 05/11/2016 2128   GLUCOSEU >=500 (A) 05/11/2016 2128   HGBUR NEGATIVE 05/11/2016 2128   BILIRUBINUR NEGATIVE 05/11/2016 2128   KETONESUR NEGATIVE 05/11/2016 2128   PROTEINUR NEGATIVE 05/11/2016 2128   UROBILINOGEN 1.0 11/29/2013 1705   NITRITE NEGATIVE 05/11/2016 2128   LEUKOCYTESUR NEGATIVE 05/11/2016 2128   Sepsis Labs: @LABRCNTIP (procalcitonin:4,lacticidven:4) )No results found for this or any previous visit (from the past 240 hour(s)).   Radiological Exams on Admission: Dg Chest 2 View  Result Date: 05/31/2016 CLINICAL DATA:  right foot pain. Right great and 2nd toes are black and hard with no cap refill, rt lower leg pain. Patient complains of pain x 1 week after catching his foot in a string in his blanket. History of DM, HTN, CKD, PVD, CVA. EXAM: CHEST  2 VIEW COMPARISON:  11/25/2015 FINDINGS: The heart size and mediastinal contours are within normal limits. Both lungs are clear. No pleural effusion or pneumothorax. The skeletal structures are intact. IMPRESSION: No active cardiopulmonary disease. Electronically Signed   By: Lajean Manes M.D.   On: 05/31/2016 18:26   Dg Foot Complete Right  Result Date: 05/31/2016 CLINICAL DATA:  right foot pain. Right great and 2nd toes are black and hard with no cap refill, rt lower leg pain. Patient complains of pain x 1 week after catching his foot in a string in his blanket. History of DM, HTN, CKD, PVD, CVA. EXAM: RIGHT FOOT COMPLETE - 3+ VIEW COMPARISON:  None. FINDINGS: No fracture. No bone lesion. No bone resorption is seen to suggest osteomyelitis. There is minor asymmetric joint space narrowing at the first metatarsophalangeal joint. Small marginal osteophytes are also noted. No discrete erosions. Remaining  joints are normally spaced and aligned. No  soft tissue air.  There are vascular calcifications. IMPRESSION: 1. No fracture or dislocation. 2. No evidence of osteomyelitis.  No soft tissue air. 3. Minor first metatarsophalangeal joint osteoarthritis. Electronically Signed   By: Lajean Manes M.D.   On: 05/31/2016 18:25    EKG: Independently reviewed. pending    Time spent:60 minutes Code Status:   FULL Family Communication: Caregiver updated at bedside Disposition Plan: expect 2-3 day hospitalization Consults called: VVS--Dr. Donzetta Matters DVT Prophylaxis: [place] Lovenox  Cory Faul, DO  [facility] Pager [phone]  If 7PM-7AM, please contact night-coverage [email or web address] Password [facility] 05/31/2016, 7:47 PM

## 2016-05-31 NOTE — ED Notes (Signed)
Dr Tat at bedside 

## 2016-05-31 NOTE — ED Triage Notes (Signed)
Patient complains of right foot pain. Right great and 2nd toes are black and hard with no cap refill. Patient complains of pain x 1 week.

## 2016-06-01 ENCOUNTER — Inpatient Hospital Stay (HOSPITAL_COMMUNITY): Payer: Medicaid Other

## 2016-06-01 DIAGNOSIS — I70235 Atherosclerosis of native arteries of right leg with ulceration of other part of foot: Secondary | ICD-10-CM

## 2016-06-01 DIAGNOSIS — I70269 Atherosclerosis of native arteries of extremities with gangrene, unspecified extremity: Secondary | ICD-10-CM

## 2016-06-01 LAB — BASIC METABOLIC PANEL
ANION GAP: 7 (ref 5–15)
BUN: 21 mg/dL — ABNORMAL HIGH (ref 6–20)
CALCIUM: 9.3 mg/dL (ref 8.9–10.3)
CO2: 26 mmol/L (ref 22–32)
Chloride: 98 mmol/L — ABNORMAL LOW (ref 101–111)
Creatinine, Ser: 1.37 mg/dL — ABNORMAL HIGH (ref 0.61–1.24)
GFR, EST NON AFRICAN AMERICAN: 53 mL/min — AB (ref >60)
GLUCOSE: 163 mg/dL — AB (ref 65–99)
Potassium: 4.4 mmol/L (ref 3.5–5.1)
SODIUM: 131 mmol/L — AB (ref 135–145)

## 2016-06-01 LAB — URINALYSIS, COMPLETE (UACMP) WITH MICROSCOPIC
Bacteria, UA: NONE SEEN
Bilirubin Urine: NEGATIVE
GLUCOSE, UA: 50 mg/dL — AB
Hgb urine dipstick: NEGATIVE
Ketones, ur: NEGATIVE mg/dL
Leukocytes, UA: NEGATIVE
NITRITE: NEGATIVE
PH: 5 (ref 5.0–8.0)
Protein, ur: NEGATIVE mg/dL
Specific Gravity, Urine: 1.016 (ref 1.005–1.030)

## 2016-06-01 LAB — GLUCOSE, CAPILLARY
GLUCOSE-CAPILLARY: 156 mg/dL — AB (ref 65–99)
GLUCOSE-CAPILLARY: 230 mg/dL — AB (ref 65–99)
Glucose-Capillary: 180 mg/dL — ABNORMAL HIGH (ref 65–99)
Glucose-Capillary: 194 mg/dL — ABNORMAL HIGH (ref 65–99)

## 2016-06-01 LAB — MRSA PCR SCREENING: MRSA by PCR: NEGATIVE

## 2016-06-01 LAB — CBC
HCT: 31 % — ABNORMAL LOW (ref 39.0–52.0)
HEMOGLOBIN: 9.8 g/dL — AB (ref 13.0–17.0)
MCH: 25.5 pg — ABNORMAL LOW (ref 26.0–34.0)
MCHC: 31.6 g/dL (ref 30.0–36.0)
MCV: 80.7 fL (ref 78.0–100.0)
Platelets: 437 10*3/uL — ABNORMAL HIGH (ref 150–400)
RBC: 3.84 MIL/uL — ABNORMAL LOW (ref 4.22–5.81)
RDW: 16.9 % — AB (ref 11.5–15.5)
WBC: 12 10*3/uL — AB (ref 4.0–10.5)

## 2016-06-01 MED ORDER — HYDROCODONE-ACETAMINOPHEN 5-325 MG PO TABS
1.0000 | ORAL_TABLET | ORAL | Status: DC | PRN
  Administered 2016-06-01 – 2016-06-02 (×7): 2 via ORAL
  Filled 2016-06-01 (×8): qty 2

## 2016-06-01 MED ORDER — MORPHINE SULFATE (PF) 2 MG/ML IV SOLN
2.0000 mg | INTRAVENOUS | Status: DC | PRN
  Administered 2016-06-01 – 2016-06-03 (×16): 2 mg via INTRAVENOUS
  Filled 2016-06-01 (×18): qty 1

## 2016-06-01 NOTE — Consult Note (Signed)
[facility] NOTE   MRN : [id number]  Reason for Consult: right foot pain with ulcer and known PAD Referring Physician:  Dr. Carles Collet  History of Present Illness: 66 y/o male with right foot pain and ulcer for a week.  Superficial second toe ulcer and dusky ischemic changes to the first and second toes.  He has been followed by Dr. Trula Slade in our office.   The patient did undergo an attempt at revascularization.  Unfortunately the patient re-presented with maggots in his wound and the leg was deemed to be nonsalvageable.  He underwent left below-knee amputation on 08/17/2015.     PMX: peripheral vascular disease, hypertension, diabetes mellitus, stroke, chronic hepatitis C, systolic CHF.    Current Facility-Administered Medications  Medication Dose Route Frequency Provider Last Rate Last Dose  . 0.9 %  sodium chloride infusion   Intravenous Continuous Fredia Sorrow, MD 75 mL/hr at 06/01/16 0018    . acetaminophen (TYLENOL) tablet 650 mg  650 mg Oral Q6H PRN Orson Eva, MD       Or  . acetaminophen (TYLENOL) suppository 650 mg  650 mg Rectal Q6H PRN Orson Eva, MD      . allopurinol (ZYLOPRIM) tablet 200 mg  200 mg Oral Daily Shanon Brow Tat, MD      . aspirin tablet 325 mg  325 mg Oral Daily David Tat, MD      . carvedilol (COREG) tablet 3.125 mg  3.125 mg Oral BID WC Orson Eva, MD   3.125 mg at 06/01/16 0723  . enoxaparin (LOVENOX) injection 40 mg  40 mg Subcutaneous Q24H Orson Eva, MD      . ferrous sulfate tablet 325 mg  325 mg Oral Daily Shanon Brow Tat, MD      . gabapentin (NEURONTIN) tablet 1,200 mg  1,200 mg Oral QHS Orson Eva, MD   1,200 mg at 05/31/16 2247  . gabapentin (NEURONTIN) tablet 600 mg  600 mg Oral BID AC Orson Eva, MD   600 mg at 06/01/16 0553  . HYDROcodone-acetaminophen (NORCO/VICODIN) 5-325 MG per tablet 1-2 tablet  1-2 tablet Oral Q4H PRN Vianne Bulls, MD   2 tablet at 06/01/16 0553  . insulin aspart (novoLOG) injection 0-15 Units  0-15 Units  Subcutaneous TID WC Orson Eva, MD   3 Units at 06/01/16 0805  . insulin aspart (novoLOG) injection 0-5 Units  0-5 Units Subcutaneous QHS Orson Eva, MD   3 Units at 05/31/16 2330  . insulin glargine (LANTUS) injection 15 Units  15 Units Subcutaneous QHS Orson Eva, MD   15 Units at 05/31/16 2330  . lisinopril (PRINIVIL,ZESTRIL) tablet 5 mg  5 mg Oral Daily David Tat, MD      . morphine 2 MG/ML injection 2 mg  2 mg Intravenous Q2H PRN Vianne Bulls, MD   2 mg at 06/01/16 0723  . ondansetron (ZOFRAN) tablet 4 mg  4 mg Oral Q6H PRN Orson Eva, MD       Or  . ondansetron (ZOFRAN) injection 4 mg  4 mg Intravenous Q6H PRN Orson Eva, MD      . polyethylene glycol (MIRALAX / GLYCOLAX) packet 17 g  17 g Oral Daily David Tat, MD      . simvastatin (ZOCOR) tablet 20 mg  20 mg Oral QPM Orson Eva, MD   20 mg at 06/01/16 0034  . tamsulosin (FLOMAX) capsule 0.4 mg  0.4 mg Oral Daily Orson Eva, MD      .  torsemide (DEMADEX) tablet 20 mg  20 mg Oral BID Orson Eva, MD   20 mg at 06/01/16 0723    Pt meds include: Statin :Yes Betablocker: No ASA: Yes Other anticoagulants/antiplatelets: none  Past Medical History:  Diagnosis Date  . Anemia   . Asthma   . BPH (benign prostatic hyperplasia)   . Cellulitis and abscess of foot 07/2015  . Chronic kidney disease    CKD stage 3  . CVA (cerebral vascular accident) ([place]) 2015   denies residual on 08/15/2015  . Hepatitis C    states he was diagnosed in 2007 or 2007 while living in [place], [place]  . Hyperlipidemia   . Hypertension   . Peripheral neuropathy   . Pneumonia X 1  . PVD (peripheral vascular disease) ([place])   . Type II diabetes mellitus ([place])     Past Surgical History:  Procedure Laterality Date  . AMPUTATION Left 08/16/2015   Procedure: LEFT BELOW THE KNEE AMPUTATION ;  Surgeon: Serafina Mitchell, MD;  Location: [facility];  Service: Vascular;  Laterality: Left;  . BACK SURGERY    . BELOW KNEE LEG AMPUTATION Left 08/16/2015  . BIOPSY N/A 09/03/2012    Procedure: BIOPSY;  Surgeon: Daneil Dolin, MD;  Location: [facility];  Service: Endoscopy;  Laterality: N/A;  gastric and gastric mucosa  . BIOPSY N/A 12/03/2012   Procedure: BIOPSY;  Surgeon: Daneil Dolin, MD;  Location: [facility];  Service: Endoscopy;  Laterality: N/A;  . COLONOSCOPY WITH PROPOFOL N/A 09/03/2012   VZD:GLOVFIE polyp-removed as outlined above. Prominent internal hemorrhoids. Tubular adenoma  . ESOPHAGOGASTRODUODENOSCOPY (EGD) WITH PROPOFOL N/A 09/03/2012   PPI:RJJOAC hernia. Gastric diverticulum. Gastric ulcers with associated erosions. Duodenal erosions. Status post gastric biopsy. H.PYLORI gastritis   . ESOPHAGOGASTRODUODENOSCOPY (EGD) WITH PROPOFOL N/A 12/03/2012   Dr. Gala Romney: gastric diverticulum, gastric erosions and scar. Previously noted gastric ulcer completed healed. Biopsy without H.pylori.   . ESOPHAGOGASTRODUODENOSCOPY (EGD) WITH PROPOFOL N/A 01/23/2016   Procedure: ESOPHAGOGASTRODUODENOSCOPY (EGD) WITH PROPOFOL;  Surgeon: Doran Stabler, MD;  Location: [facility];  Service: Gastroenterology;  Laterality: N/A;  . LIVER BIOPSY  2005   Done in [place], [place]. Chronic hepatitis with mild periportal inflammation, lobular unicellular necrosis and portal fibrosis. Grade 2, stage 1-2.  Marland Kitchen MAXIMUM ACCESS (MAS)POSTERIOR LUMBAR INTERBODY FUSION (PLIF) 1 LEVEL N/A 11/25/2013   Procedure: FOR MAXIMUM ACCESS (MAS) POSTERIOR LUMBAR INTERBODY FUSION (PLIF) 1 LEVEL;  Surgeon: Eustace Moore, MD;  Location: [facility];  Service: Neurosurgery;  Laterality: N/A;  FOR MAXIMUM ACCESS (MAS) POSTERIOR LUMBAR INTERBODY FUSION (PLIF) 1 LEVEL LUMBAR 3-4  . PERIPHERAL VASCULAR CATHETERIZATION Left 08/01/2015   Procedure: Lower Extremity Angiography;  Surgeon: Serafina Mitchell, MD;  Location: [facility];  Service: Cardiovascular;  Laterality: Left;  . PERIPHERAL VASCULAR CATHETERIZATION N/A 08/01/2015   Procedure: Abdominal Aortogram;  Surgeon: Serafina Mitchell, MD;  Location: [facility];  Service: Cardiovascular;  Laterality: N/A;  . PERIPHERAL VASCULAR CATHETERIZATION N/A 08/08/2015   Procedure: Abdominal Aortogram w/Lower Extremity;  Surgeon: Serafina Mitchell, MD;  Location: [facility];  Service: Cardiovascular;  Laterality: N/A;  . PERIPHERAL VASCULAR CATHETERIZATION Left 08/08/2015   Procedure: Peripheral Vascular Balloon Angioplasty;  Surgeon: Serafina Mitchell, MD;  Location: [facility];  Service: Cardiovascular;  Laterality: Left;  left popiteal artery, left peronealtrunk, left post tibial  . POLYPECTOMY N/A 09/03/2012   Procedure: POLYPECTOMY;  Surgeon: Daneil Dolin, MD;  [facility];  Service: Endoscopy;  Laterality: N/A;  cecal polyp  . TONSILLECTOMY      Social History Social History  Substance Use Topics  . Smoking status: Former Smoker    Packs/day: 1.00    Years: 47.00    Types: Cigarettes    Quit date: 07/13/2015  . Smokeless tobacco: Never Used  . Alcohol use No     Comment:  none 08/15/2015 "stopped drinking 3-4 years ago"    Family History Family History  Problem Relation Age of Onset  . Breast cancer Mother   . Heart failure Mother   . Diabetes Father   . Hypertension Father   . Heart disease Father   . Hyperlipidemia Father   . Diabetes Sister   . Hypertension Sister   . Breast cancer Sister   . Colon cancer Neg Hx   . Liver disease Neg Hx     No Known Allergies   REVIEW OF SYSTEMS  General: [ ]  Weight loss, [ ]  Fever, [ ]  chills Neurologic: [ ]  Dizziness, [ ]  Blackouts, [ ]  Seizure [ ]  Stroke, [ ]  "Mini stroke", [ ]  Slurred speech, [ ]  Temporary blindness; [ ]  weakness in arms or legs, [ ]  Hoarseness [ ]  Dysphagia Cardiac: [ ]  Chest pain/pressure, [ ]  Shortness of breath at rest [ ]  Shortness of breath with exertion, [ ]  Atrial fibrillation or irregular heartbeat  Vascular: [ ]  Pain in legs with walking, [ ]  Pain in legs at rest, [ ]  Pain in legs at night,  [ ]  Non-healing ulcer, [ ]  Blood clot in vein/DVT,    Pulmonary: [ ]  Home oxygen, [ ]  Productive cough, [ ]  Coughing up blood, [ ]  Asthma,  [ ]  Wheezing [ ]  COPD Musculoskeletal:  [ ]  Arthritis, [ ]  Low back pain, [ ]  Joint pain Hematologic: [ ]  Easy Bruising, [ ]  Anemia; [ ]  Hepatitis Gastrointestinal: [ ]  Blood in stool, [ ]  Gastroesophageal Reflux/heartburn, Urinary: [ x] chronic Kidney disease, [ ]  on HD - [ ]  MWF or [ ]  TTHS, [ ]  Burning with urination, [ ]  Difficulty urinating Skin: [ ]  Rashes, [x ] Wounds Psychological: [ ]  Anxiety, [ ]  Depression  Physical Examination Vitals:   05/31/16 2108 05/31/16 2205 06/01/16 0553 06/01/16 0723  BP: 111/74 (!) 143/77 120/77 116/63  Pulse:  95 87 87  Resp:  18 18   Temp:  98 F (36.7 C) 97.8 F (36.6 C)   TempSrc:  Oral Oral   SpO2:  97% 100%   Weight:  219 lb 9.6 oz (99.6 kg)    Height:  6\' 1"  (1.854 m)     Body mass index is 28.97 kg/m.  General:  WDWN in NAD Gait: Normal HENT: WNL Eyes: Pupils equal Pulmonary: normal non-labored breathing , without Rales, rhonchi,  wheezing Cardiac: RRR, without  Murmurs, rubs or gallops; Abdomen: soft, NT, no masses Skin: no rashes, ulcers noted;  no Gangrene , no cellulitis; no open wounds;   Vascular Exam/Pulses:palpable femoral pulse, no popliteal, DP/PT/Peroneal signals on the right LE.  Active range of motion intac right foot.  Well healed left BKA.   Musculoskeletal: no muscle wasting or atrophy; no edema  Neurologic: A&O X 3; Appropriate Affect ;  SENSATION: normal; MOTOR FUNCTION: grossly intact in all 4 extremities Speech is fluent/normal   Significant Diagnostic Studies: CBC Lab Results  Component Value Date   WBC 12.0 (H) 06/01/2016   HGB 9.8 (L) 06/01/2016   HCT 31.0 (L)  06/01/2016   MCV 80.7 06/01/2016   PLT 437 (H) 06/01/2016    BMET    Component Value Date/Time   NA 131 (L) 06/01/2016 0409   NA 134 (A) 12/27/2015   K 4.4 06/01/2016 0409   K 4.0 10/31/2011 1319   CL 98 (L) 06/01/2016 0409   CO2 26 06/01/2016  0409   GLUCOSE 163 (H) 06/01/2016 0409   BUN 21 (H) 06/01/2016 0409   BUN 16 12/27/2015   CREATININE 1.37 (H) 06/01/2016 0409   CREATININE 1.06 12/22/2015 1551   CALCIUM 9.3 06/01/2016 0409   CALCIUM 9.0 10/31/2011 1319   GFRNONAA 53 (L) 06/01/2016 0409   GFRAA >60 06/01/2016 0409   Estimated Creatinine Clearance: 68.5 mL/min (A) (by C-G formula based on SCr of 1.37 mg/dL (H)).  COAG Lab Results  Component Value Date   INR 1.17 05/31/2016   INR 1.2 (H) 10/19/2015   INR 1.35 08/08/2015     Non-Invasive Vascular Imaging:  Previous ABI: 07/19/2015 FINDINGS: Right ABI:  0.91  Left ABI:  0.45  Right Lower Extremity: Significant pressure differential between the low thigh and calf cuffs suggesting distal femoral popliteal disease. Preserved digital waveforms on PVRs.  Left Lower Extremity: Significant pressure gradient between the low thigh cuff and calf cuff consistent with distal femoral popliteal disease. Additionally, there are significant pressure gradients between the calf and ankle consistent with runoff disease.  ASSESSMENT/PLAN:  PAD Ischemic right foot No doppler signal are present.  Active range of motion is intact in the right foot and ankle.   We will order ABI;s and arterial duple of the right LE.  Plan for angiogram Mon. 06/03/2016 by Dr. Donzetta Matters with possible intervention.     Laurence Slate [facility] 06/01/2016 9:09 AM  I have independently interviewed patient and agree with PA assessment and plan above. Discussed with him high likelihood of at least amputation of toes and possibly leg. He demonstrates good understanding. Will plan for angiogram likely Monday afternoon.   Verbie Babic C. Donzetta Matters, MD [facility] Office: [phone] Pager: [phone]

## 2016-06-01 NOTE — Progress Notes (Signed)
VASCULAR LAB PRELIMINARY  ARTERIAL  ABI completed:Unable to ascertain right ABI secondary to inaudible pulses.  Duplex imaging reveals adequate flow and normal waveforms in the right common femoral artery and profunda arteries.  There is minimal flow noted throughout the femoral, popliteal, and posterior tibial arteries.     RIGHT    LEFT    PRESSURE WAVEFORM  PRESSURE WAVEFORM  BRACHIAL 103 T BRACHIAL BKA   DP absent  DP    AT absent  AT    PT absent  PT    PER   PER    GREAT TOE  NA GREAT TOE  NA    RIGHT LEFT  ABI absent      Dorota Heinrichs, RVT 06/01/2016, 3:25 PM

## 2016-06-01 NOTE — Progress Notes (Signed)
Patient ID: Cory Weber, male   DOB: Aug 05, 1950, 66 y.o.   MRN: [id number]                                                                PROGRESS NOTE                                                                                                                                                                                                             Patient Demographics:    Cory Weber, is a 66 y.o. male, DOB - 11/11/1950, GXQ:[id number]  Admit date - 05/31/2016   Admitting Physician Orson Eva, MD  Outpatient Primary MD for the patient is Maggie Font, MD  LOS - 1  Outpatient Specialists:     Chief Complaint  Patient presents with  . Circulatory Problem       Brief Narrative   66 y.o. male with medical history of peripheral vascular disease, hypertension, diabetes mellitus, stroke, chronic hepatitis C, systolic CHF presents with one-week history of right foot pain that has worsened significantly in the past 4 days. On 05/28/2016, the patient noticed some bleeding and drainage about his first and second toes which he felt the skin was denuded from some "string" on his comforter.  He continued to have worsening pain and noted some yellow drainage between his first and second right toes.  As a result, he presented to the ED for further evaluation.  He denies any fever, chills, cp, sob, n/v/d, abdominal pain, Hematochezia, melena hematochezia, melena.  The patient has not been able to bear weight on his right foot because of increasing pain. Marland Kitchen  He had a left BKA on 08/17/15 after a failed revascularization attempt.  He recently went home from a SNF about one month ago.  Since then, he has run out of some of his medications.  Therefore, he has not taken his insulin or aspirin for a few weeks.  He does not know all his medications, but there are a number of meds he did not recognize when I went through his list with him.  He denies any recent injury or trauma to his right foot. For the past 4  days, he has been only able to transfer from his bed to the commode because of pain.   In  the emergency department, the patient was afebrile and hemodynamically stable since 2097% on room air. BMP was essentially unremarkable except for sodium of 1:30. Hepatic enzymes were unremarkable. WBC was 16.0, hemoglobin 9.9, platelets 470,000. Lactic acid was 1.0. Chest x-ray was negative. X-ray of the right foot was negative for fracture, dislocation, or soft tissue air.   Subjective:    [name] today has, c/o right leg pain.  Dark toe.  No headache, No chest pain, No abdominal pain - No Nausea, No new weakness tingling or numbness, No Cough - SOB.    Assessment  & Plan :    Active Problems:   Essential hypertension   Chronic hepatitis C without hepatic coma (HCC)   PVD (peripheral vascular disease) ([place])   Uncontrolled type II diabetes with peripheral autonomic neuropathy (HCC)   Ischemia of foot   Ischemia of the right foot  -Patient presented with increasing pain in his right foot for the past week noting increasing dDuskiness and pain over the past 4 days duskiness about his toes -concerned about salvagability of right foot -[facility] was consulted by ED--Dr. Donzetta Matters who is aware of consult Consider iv heparin , defer to vascular surgery  Peripheral vascular disease  -The patient stopped taking his aspirin for the last 2-3 weeks  -Status post left BKA 08/17/2015--Dr. Brabham Aspirin restarted, cont simvastatin  Leukocytosis -likely stress demargination -he is afebrile and hemodynamically stable -will not start abx -UA and culture -blood culture x 2 -CXR--neg  Diabetes Mellitus type 2--uncontrolled with neuropathy -pt has been out of insulin for past 2-3 weeks -restart low dose Lantus as he will be NPO after MN -novolog sliding scale -continue gabapentin -holding metformin  HTN -cont  lisinopril and carvedilol  Hyperlipidemia -continue statin  Chronic systolic and  diastolic CHF -07/16/44 Echo--EF 50-55%, grade 2 DD -08/03/15 Echo EF 45-50% -clinically euvolemic -continue home dose torsemide and carvedilol  Anemia cbc in am  Hyponatremia likely secondary to pain, and torsemide Check cmp in am       Code Status : FULL CODE  Family Communication  :     Disposition Plan  :   ? Rehab vs home  Barriers For Discharge :   Consults  :  Vascular surgery  Procedures  :   DVT Prophylaxis  :  Lovenox - SCDs   Lab Results  Component Value Date   PLT 437 (H) 06/01/2016    Antibiotics  :    Anti-infectives    None        Objective:   Vitals:   05/31/16 2040 05/31/16 2108 05/31/16 2205 06/01/16 0553  BP:  111/74 (!) 143/77 120/77  Pulse: 92  95 87  Resp:   18 18  Temp:   98 F (36.7 C) 97.8 F (36.6 C)  TempSrc:   Oral Oral  SpO2: 97%  97% 100%  Weight:   99.6 kg (219 lb 9.6 oz)   Height:   6\' 1"  (1.854 m)     Wt Readings from Last 3 Encounters:  05/31/16 99.6 kg (219 lb 9.6 oz)  05/11/16 116.1 kg (256 lb)  04/24/16 116.5 kg (256 lb 12.8 oz)     Intake/Output Summary (Last 24 hours) at 06/01/16 0724 Last data filed at 06/01/16 0426  Gross per 24 hour  Intake           1077.5 ml  Output                0 ml  Net           1077.5 ml     Physical Exam  Awake Alert, Oriented X 3, No new F.N deficits, Normal affect [place].AT,PERRAL Supple Neck,No JVD, No cervical lymphadenopathy appriciated.  Symmetrical Chest wall movement, Good air movement bilaterally, CTAB RRR,No Gallops,Rubs or new Murmurs, No Parasternal Heave +ve B.Sounds, Abd Soft, No tenderness, No organomegaly appriciated, No rebound - guarding or rigidity. No Cyanosis, Clubbing or edema,2nd and 3rd toe tip black. 1cm x0.8cm areas on the 2nd toe skin missing, from cut per pt.    Data Review:    CBC  Recent Labs Lab 05/31/16 1646 06/01/16 0409  WBC 16.0* 12.0*  HGB 9.9* 9.8*  HCT 31.7* 31.0*  PLT 407* 437*  MCV 80.7 80.7  MCH 25.2* 25.5*  MCHC  31.2 31.6  RDW 16.3* 16.9*  LYMPHSABS 3.0  --   MONOABS 1.0  --   EOSABS 0.1  --   BASOSABS 0.0  --     Chemistries   Recent Labs Lab 05/31/16 1646 06/01/16 0409  NA 130* 131*  K 4.8 4.4  CL 96* 98*  CO2 27 26  GLUCOSE 248* 163*  BUN 21* 21*  CREATININE 1.13 1.37*  CALCIUM 9.2 9.3  AST 12*  --   ALT 16*  --   ALKPHOS 93  --   BILITOT 0.5  --    ------------------------------------------------------------------------------------------------------------------ No results for input(s): CHOL, HDL, LDLCALC, TRIG, CHOLHDL, LDLDIRECT in the last 72 hours.  Lab Results  Component Value Date   HGBA1C 7.6 (H) 11/26/2015   ------------------------------------------------------------------------------------------------------------------ No results for input(s): TSH, T4TOTAL, T3FREE, THYROIDAB in the last 72 hours.  Invalid input(s): FREET3 ------------------------------------------------------------------------------------------------------------------ No results for input(s): VITAMINB12, FOLATE, FERRITIN, TIBC, IRON, RETICCTPCT in the last 72 hours.  Coagulation profile  Recent Labs Lab 05/31/16 1646  INR 1.17    No results for input(s): DDIMER in the last 72 hours.  Cardiac Enzymes No results for input(s): CKMB, TROPONINI, MYOGLOBIN in the last 168 hours.  Invalid input(s): CK ------------------------------------------------------------------------------------------------------------------    Component Value Date/Time   BNP 52.4 12/22/2015 1551    Inpatient Medications  Scheduled Meds: . allopurinol  200 mg Oral Daily  . aspirin  325 mg Oral Daily  . carvedilol  3.125 mg Oral BID WC  . enoxaparin (LOVENOX) injection  40 mg Subcutaneous Q24H  . ferrous sulfate  325 mg Oral Daily  . gabapentin  1,200 mg Oral QHS  . gabapentin  600 mg Oral BID AC  . insulin aspart  0-15 Units Subcutaneous TID WC  . insulin aspart  0-5 Units Subcutaneous QHS  . insulin  glargine  15 Units Subcutaneous QHS  . lisinopril  5 mg Oral Daily  . polyethylene glycol  17 g Oral Daily  . simvastatin  20 mg Oral QPM  . tamsulosin  0.4 mg Oral Daily  . torsemide  20 mg Oral BID   Continuous Infusions: . sodium chloride 75 mL/hr at 06/01/16 0018   PRN Meds:.acetaminophen **OR** acetaminophen, HYDROcodone-acetaminophen, morphine injection, ondansetron **OR** ondansetron (ZOFRAN) IV  Micro Results Recent Results (from the past 240 hour(s))  MRSA PCR Screening     Status: None   Collection Time: 06/01/16 12:25 AM  Result Value Ref Range Status   MRSA by PCR NEGATIVE NEGATIVE Final    Comment:        The GeneXpert MRSA Assay (FDA approved for NASAL specimens only), is one component of a comprehensive MRSA colonization surveillance program. It is  not intended to diagnose MRSA infection nor to guide or monitor treatment for MRSA infections.     Radiology Reports Dg Chest 2 View  Result Date: 05/31/2016 CLINICAL DATA:  right foot pain. Right great and 2nd toes are black and hard with no cap refill, rt lower leg pain. Patient complains of pain x 1 week after catching his foot in a string in his blanket. History of DM, HTN, CKD, PVD, CVA. EXAM: CHEST  2 VIEW COMPARISON:  11/25/2015 FINDINGS: The heart size and mediastinal contours are within normal limits. Both lungs are clear. No pleural effusion or pneumothorax. The skeletal structures are intact. IMPRESSION: No active cardiopulmonary disease. Electronically Signed   By: Lajean Manes M.D.   On: 05/31/2016 18:26   Dg Foot Complete Right  Result Date: 05/31/2016 CLINICAL DATA:  right foot pain. Right great and 2nd toes are black and hard with no cap refill, rt lower leg pain. Patient complains of pain x 1 week after catching his foot in a string in his blanket. History of DM, HTN, CKD, PVD, CVA. EXAM: RIGHT FOOT COMPLETE - 3+ VIEW COMPARISON:  None. FINDINGS: No fracture. No bone lesion. No bone resorption is seen  to suggest osteomyelitis. There is minor asymmetric joint space narrowing at the first metatarsophalangeal joint. Small marginal osteophytes are also noted. No discrete erosions. Remaining joints are normally spaced and aligned. No soft tissue air.  There are vascular calcifications. IMPRESSION: 1. No fracture or dislocation. 2. No evidence of osteomyelitis.  No soft tissue air. 3. Minor first metatarsophalangeal joint osteoarthritis. Electronically Signed   By: Lajean Manes M.D.   On: 05/31/2016 18:25    Time Spent in minutes  30   Jani Gravel M.D on 06/01/2016 at 7:24 AM  Between 7am to 7pm - Pager - [phone] After 7pm go to [email or web address] - password [facility]  [facility] -  Office  [phone]

## 2016-06-02 DIAGNOSIS — E1143 Type 2 diabetes mellitus with diabetic autonomic (poly)neuropathy: Secondary | ICD-10-CM

## 2016-06-02 DIAGNOSIS — N179 Acute kidney failure, unspecified: Secondary | ICD-10-CM

## 2016-06-02 DIAGNOSIS — I10 Essential (primary) hypertension: Secondary | ICD-10-CM

## 2016-06-02 DIAGNOSIS — E1165 Type 2 diabetes mellitus with hyperglycemia: Secondary | ICD-10-CM

## 2016-06-02 DIAGNOSIS — I998 Other disorder of circulatory system: Secondary | ICD-10-CM

## 2016-06-02 LAB — COMPREHENSIVE METABOLIC PANEL
ALT: 20 U/L (ref 17–63)
AST: 21 U/L (ref 15–41)
Albumin: 2.9 g/dL — ABNORMAL LOW (ref 3.5–5.0)
Alkaline Phosphatase: 94 U/L (ref 38–126)
Anion gap: 8 (ref 5–15)
BUN: 32 mg/dL — ABNORMAL HIGH (ref 6–20)
CHLORIDE: 97 mmol/L — AB (ref 101–111)
CO2: 26 mmol/L (ref 22–32)
Calcium: 8.8 mg/dL — ABNORMAL LOW (ref 8.9–10.3)
Creatinine, Ser: 1.55 mg/dL — ABNORMAL HIGH (ref 0.61–1.24)
GFR, EST AFRICAN AMERICAN: 53 mL/min — AB (ref >60)
GFR, EST NON AFRICAN AMERICAN: 46 mL/min — AB (ref >60)
Glucose, Bld: 151 mg/dL — ABNORMAL HIGH (ref 65–99)
POTASSIUM: 4.7 mmol/L (ref 3.5–5.1)
Sodium: 131 mmol/L — ABNORMAL LOW (ref 135–145)
Total Bilirubin: 0.5 mg/dL (ref 0.3–1.2)
Total Protein: 8.3 g/dL — ABNORMAL HIGH (ref 6.5–8.1)

## 2016-06-02 LAB — GLUCOSE, CAPILLARY
GLUCOSE-CAPILLARY: 185 mg/dL — AB (ref 65–99)
GLUCOSE-CAPILLARY: 218 mg/dL — AB (ref 65–99)
GLUCOSE-CAPILLARY: 184 mg/dL — AB (ref 65–99)
Glucose-Capillary: 324 mg/dL — ABNORMAL HIGH (ref 65–99)

## 2016-06-02 LAB — CBC
HCT: 28.3 % — ABNORMAL LOW (ref 39.0–52.0)
Hemoglobin: 8.4 g/dL — ABNORMAL LOW (ref 13.0–17.0)
MCH: 24.3 pg — AB (ref 26.0–34.0)
MCHC: 29.7 g/dL — ABNORMAL LOW (ref 30.0–36.0)
MCV: 82 fL (ref 78.0–100.0)
PLATELETS: 413 10*3/uL — AB (ref 150–400)
RBC: 3.45 MIL/uL — ABNORMAL LOW (ref 4.22–5.81)
RDW: 16.6 % — AB (ref 11.5–15.5)
WBC: 11 10*3/uL — ABNORMAL HIGH (ref 4.0–10.5)

## 2016-06-02 LAB — URINE CULTURE

## 2016-06-02 LAB — HEMOGLOBIN A1C
HEMOGLOBIN A1C: 9 % — AB (ref 4.8–5.6)
MEAN PLASMA GLUCOSE: 212 mg/dL

## 2016-06-02 LAB — VAS US LOWER EXTREMITY ARTERIAL DUPLEX
RSFPPSV: 59 cm/s
Right popliteal dist sys PSV: -20 cm/s
Right super femoral dist sys PSV: -13 cm/s
Right super femoral mid sys PSV: -11 cm/s

## 2016-06-02 MED ORDER — HYDROCODONE-ACETAMINOPHEN 5-325 MG PO TABS
2.0000 | ORAL_TABLET | ORAL | Status: DC | PRN
  Administered 2016-06-02 – 2016-06-04 (×7): 2 via ORAL
  Filled 2016-06-02 (×7): qty 2

## 2016-06-02 MED ORDER — CEFAZOLIN IN D5W 1 GM/50ML IV SOLN
1.0000 g | Freq: Three times a day (TID) | INTRAVENOUS | Status: DC
  Administered 2016-06-02 – 2016-06-04 (×5): 1 g via INTRAVENOUS
  Filled 2016-06-02 (×7): qty 50

## 2016-06-02 NOTE — Evaluation (Signed)
Physical Therapy Evaluation Patient Details Name: Cory Weber MRN: [id number] DOB: 04-14-1950 Today's Date: 06/02/2016   History of Present Illness  Cory Weber is a 66 y.o. male with medical history of peripheral vascular disease, hypertension, diabetes mellitus, stroke, chronic hepatitis C, systolic CHF presents with one-week history of right foot pain that has worsened significantly in the past 4 days. It is noteworthy that he was recently dc'd from SNF stay to his home (approx 1 month ago); plan is for angiogram 4/23, then decisions re: surgical interventions (possible amputation)  Clinical Impression   Pt admitted with above diagnosis. Pt currently with functional limitations due to the deficits listed below (see PT Problem List). DC recommendations will largely depend on recovery post angiogram and post potential amputation; At this point, I think it is worth pursuing post-acute rehabilitation to maximize independence and safety with mobility, even if at a wheelchair level, and [name] insurance typically only supports post-acute rehab at the CIR level;  Lateral scoot transfers are the mode in which he has been functioning at home over the past month; It is possible that with intensive rehab at [facility] he can reach modified independent wheelchair transfer level; Recommend OT consult for ADLs; Pt will benefit from skilled PT to increase their independence and safety with mobility to allow discharge to the venue listed below.       Follow Up Recommendations Other (comment) (Post-acute Rehabilitation)    Equipment Recommendations  Wheelchair (measurements PT);Wheelchair cushion (measurements PT);Other (comment) (drop-arm BSC)    Recommendations for Other Services OT consult;Other (comment) (will consider Rehab Consult post angiogram and possible amputation )     Precautions / Restrictions Precautions Precautions: Fall      Mobility  Bed Mobility Overal bed mobility: Needs  Assistance Bed Mobility: Supine to Sit     Supine to sit: Supervision     General bed mobility comments: Incr time; used rails; HOB elevated; Supervision for safety, not needing physical assist  Transfers Overall transfer level: Needs assistance Equipment used: Rolling walker (2 wheeled) (2 person assist ) Transfers: Sit to/from [employer] Sit to Stand: +2 physical assistance;Total assist   Squat pivot transfers: +2 physical assistance;Total assist     General transfer comment: Noting RLE pain and weakness, making single limb stance quite difficult; Unable to acheive fully upright standing with RW and +2 assist, knee blocked, and bilateral support at gait belt/shoulder girdle; Required R knee blocking with basic squat pivot transfer and Heavy +2 assist  Ambulation/Gait                Stairs            Wheelchair Mobility    Modified Rankin (Stroke Patients Only)       Balance Overall balance assessment: Needs assistance Sitting-balance support: Bilateral upper extremity supported;No upper extremity supported Sitting balance-Leahy Scale: Good       Standing balance-Leahy Scale: Zero                               Pertinent Vitals/Pain Pain Assessment: 0-10 Pain Score: 6  Pain Location: R foot Pain Descriptors / Indicators: Aching Pain Intervention(s): Repositioned;Patient requesting pain meds-RN notified    Home Living Family/patient expects to be discharged to:: Private residence (post-acute rehab) Living Arrangements: Alone Available Help at Discharge: Family;Personal care attendant (Aide assists daily; 3-4 hours in am and 3-4 hours in pm) Type of Home: [place]  Access: Stairs to enter Entrance Stairs-Rails: Psychiatric nurse of Steps: 1 Home Layout: One level Home Equipment: Walker - 2 wheels;Bedside commode;Cane - single point;Shower seat - built in;Grab bars - toilet;Grab bars -  tub/shower;Wheelchair - manual      Prior Function Level of Independence: Needs assistance   Gait / Transfers Assistance Needed: Largely wc bound since going home from [name] about 1 month ago; Performs lateral scoots with armrest removed  ADL's / Homemaking Assistance Needed: at wheelchair level; has an Aide what sounds like 4-6 hours/day        Hand Dominance   Dominant Hand: Right    Extremity/Trunk Assessment   Upper Extremity Assessment Upper Extremity Assessment: Generalized weakness    Lower Extremity Assessment Lower Extremity Assessment: RLE deficits/detail;LLE deficits/detail RLE Deficits / Details: Painful R foot; hip and knee grossly 4/5 MMT, however unable to extend hip and knee in stance for fully upright standing -- gross weakness noted LLE Deficits / Details: Prior BKA       Communication   Communication: No difficulties  Cognition Arousal/Alertness: Awake/alert Behavior During Therapy: WFL for tasks assessed/performed Overall Cognitive Status: Within Functional Limits for tasks assessed (for basic mobility)                                        General Comments General comments (skin integrity, edema, etc.): RN present during session and assisted with hygeine during standing trial    Exercises Other Exercises Other Exercises: Chair push ups x5; generally weak, unable to fully extend elbows   Assessment/Plan    PT Assessment Patient needs continued PT services  PT Problem List Decreased strength;Decreased range of motion;Decreased activity tolerance;Decreased balance;Decreased mobility;Decreased coordination;Decreased knowledge of use of DME;Decreased knowledge of precautions;Pain       PT Treatment Interventions DME instruction;Functional mobility training;Therapeutic activities;Therapeutic exercise;Balance training;Neuromuscular re-education;Cognitive remediation;Patient/family education;Wheelchair mobility training    PT  Goals (Current goals can be found in the Care Plan section)  Acute Rehab PT Goals Patient Stated Goal: knows that some form of amputation is necessary PT Goal Formulation: With patient Time For Goal Achievement: 06/16/16 Potential to Achieve Goals: Good    Frequency Min 3X/week   Barriers to discharge Other (comment) [name] insurance typically doesn't support receiving therapies at SNF or [facility] (except certain diagnoses)    Co-evaluation               End of Session Equipment Utilized During Treatment: Gait belt Activity Tolerance: Patient tolerated treatment well Patient left: in chair;with call bell/phone within reach;with chair alarm set Nurse Communication: Mobility status PT Visit Diagnosis: Other abnormalities of gait and mobility (R26.89);Pain Pain - Right/Left: Right Pain - part of body: Ankle and joints of foot    Time: 0822-0845 PT Time Calculation (min) (ACUTE ONLY): 23 min   Charges:   PT Evaluation $PT Eval Moderate Complexity: 1 Procedure PT Treatments $Therapeutic Activity: 8-22 mins   PT G Codes:        Roney Marion, PT  Acute Rehabilitation Services Pager [phone] Office [phone]   [place]letta [place] 06/02/2016, 9:22 AM

## 2016-06-02 NOTE — Progress Notes (Signed)
  Progress Note    06/02/2016 11:03 AM * No surgery found *  Subjective:  No acute issues  Vitals:   06/01/16 1951 06/02/16 0346  BP: 99/69 (!) 122/58  Pulse: 84 84  Resp: 18   Temp: 97.8 F (36.6 C) 98.3 F (36.8 C)    Physical Exam: Bilateral palpable femoral pulses R foot with stable appearing gangrene  CBC    Component Value Date/Time   WBC 11.0 (H) 06/02/2016 0356   RBC 3.45 (L) 06/02/2016 0356   HGB 8.4 (L) 06/02/2016 0356   HCT 28.3 (L) 06/02/2016 0356   HCT 26.3 (L) 11/26/2015 1440   HCT 32 10/31/2011 1319   PLT 413 (H) 06/02/2016 0356   MCV 82.0 06/02/2016 0356   MCV 82.8 10/31/2011 1319   MCH 24.3 (L) 06/02/2016 0356   MCHC 29.7 (L) 06/02/2016 0356   RDW 16.6 (H) 06/02/2016 0356   LYMPHSABS 3.0 05/31/2016 1646   MONOABS 1.0 05/31/2016 1646   EOSABS 0.1 05/31/2016 1646   BASOSABS 0.0 05/31/2016 1646    BMET    Component Value Date/Time   NA 131 (L) 06/02/2016 0356   NA 134 (A) 12/27/2015   K 4.7 06/02/2016 0356   K 4.0 10/31/2011 1319   CL 97 (L) 06/02/2016 0356   CO2 26 06/02/2016 0356   GLUCOSE 151 (H) 06/02/2016 0356   BUN 32 (H) 06/02/2016 0356   BUN 16 12/27/2015   CREATININE 1.55 (H) 06/02/2016 0356   CREATININE 1.06 12/22/2015 1551   CALCIUM 8.8 (L) 06/02/2016 0356   CALCIUM 9.0 10/31/2011 1319   GFRNONAA 46 (L) 06/02/2016 0356   GFRAA 53 (L) 06/02/2016 0356    INR    Component Value Date/Time   INR 1.17 05/31/2016 1646     Intake/Output Summary (Last 24 hours) at 06/02/16 1103 Last data filed at 06/02/16 6734  Gross per 24 hour  Intake             2305 ml  Output              200 ml  Net             2105 ml     Assessment:  66 y.o. male is s/p bka now with right foot gangrene.   Plan: Would benefit from at least po antibiotics for right foot Tentatively planning for aortogram with possible R intervention tomorrow based on renal function  Chantry Headen C. Donzetta Matters, MD [facility] [place] Office:  [phone] Pager: [phone]  06/02/2016 11:03 AM

## 2016-06-02 NOTE — Progress Notes (Addendum)
PROGRESS NOTE                                                                                                                                                                                                             Patient Demographics:    Cory Weber, is a 66 y.o. male, DOB - May 08, 1950, OJJ:[id number]  Admit date - 05/31/2016   Admitting Physician Orson Eva, MD  Outpatient Primary MD for the patient is Maggie Font, MD  LOS - 2  Outpatient Specialists: [facility]  Chief Complaint  Patient presents with  . Circulatory Problem       Brief Narrative  66 year old male with history of peripheral vascular disease (BKA in 08/2015 after failure to revascularize attempt), hypertension, diabetes mellitus, history of stroke, chronic hep C and systolic CHF presented to [facility] ED with one-week history of right foot pain that has progressively worsened. He also noticed bleeding and drainage from his first and second toes. In the ED vitals were stable with leukocytosis (WBC of 16) and mild hyponatremia. X-ray of the right foot negative for fracture or osteomyelitis. Vascular surgery consulted for ischemic right foot.    Subjective:    Patient complains of pain in his right foot not improved on current pain regimen.   Assessment  & Plan :   Principal problem Ischemic right foot with gangrene Vascular surgery following. Plan on arteriogram tomorrow with intervention if renal function stable. I will place him on Ancef empirically. Pain control with Vicodin when necessary. Continue low-dose morphine when necessary. Continue Neurontin.  History of systolic CHF. Euvolemic. EF of 50-55% per last echo. Will hold his ACE inhibitor and torsemide given worsened renal function. Continue aspirin and Coreg.  History of stroke Continue aspirin and statin.  Diabetes mellitus type 2 uncontrolled Continue Lantus with sliding scale  coverage  BPH Continue Flomax.  Acute kidney injury. Hold Torsemide, ACE inhibitor and metformin. On gentle IV hydration. Monitor in am.    Code Status : Full code  Family Communication  : None at bedside  Disposition Plan  : Pending hospital course  Barriers For Discharge : Active symptoms. Arteriogram tomorrow  Consults  : Vascular surgery (Dr. Donzetta Matters))  Procedures  : ABI  DVT Prophylaxis  :  Lovenox -  Lab Results  Component Value Date   PLT 413 (H)  06/02/2016    Antibiotics  :   Anti-infectives    None        Objective:   Vitals:   06/01/16 0723 06/01/16 1444 06/01/16 1951 06/02/16 0346  BP: 116/63 102/67 99/69 (!) 122/58  Pulse: 87 90 84 84  Resp:  18 18   Temp:  97.8 F (36.6 C) 97.8 F (36.6 C) 98.3 F (36.8 C)  TempSrc:  Oral Oral Oral  SpO2:  97% 94% 96%  Weight:      Height:        Wt Readings from Last 3 Encounters:  05/31/16 99.6 kg (219 lb 9.6 oz)  05/11/16 116.1 kg (256 lb)  04/24/16 116.5 kg (256 lb 12.8 oz)     Intake/Output Summary (Last 24 hours) at 06/02/16 1144 Last data filed at 06/02/16 4098  Gross per 24 hour  Intake             2305 ml  Output              200 ml  Net             2105 ml     Physical Exam  Gen: not in distress HEENT: moist mucosa, supple neck Chest: clear b/l, no added sounds CVS: N S1&S2, no murmurs,  GI: soft, NT, ND, Musculoskeletal: Left BKA, absent right distal pulses     Data Review:    CBC  Recent Labs Lab 05/31/16 1646 06/01/16 0409 06/02/16 0356  WBC 16.0* 12.0* 11.0*  HGB 9.9* 9.8* 8.4*  HCT 31.7* 31.0* 28.3*  PLT 407* 437* 413*  MCV 80.7 80.7 82.0  MCH 25.2* 25.5* 24.3*  MCHC 31.2 31.6 29.7*  RDW 16.3* 16.9* 16.6*  LYMPHSABS 3.0  --   --   MONOABS 1.0  --   --   EOSABS 0.1  --   --   BASOSABS 0.0  --   --     Chemistries   Recent Labs Lab 05/31/16 1646 06/01/16 0409 06/02/16 0356  NA 130* 131* 131*  K 4.8 4.4 4.7  CL 96* 98* 97*  CO2 27 26 26   GLUCOSE  248* 163* 151*  BUN 21* 21* 32*  CREATININE 1.13 1.37* 1.55*  CALCIUM 9.2 9.3 8.8*  AST 12*  --  21  ALT 16*  --  20  ALKPHOS 93  --  94  BILITOT 0.5  --  0.5   ------------------------------------------------------------------------------------------------------------------ No results for input(s): CHOL, HDL, LDLCALC, TRIG, CHOLHDL, LDLDIRECT in the last 72 hours.  Lab Results  Component Value Date   HGBA1C 7.6 (H) 11/26/2015   ------------------------------------------------------------------------------------------------------------------ No results for input(s): TSH, T4TOTAL, T3FREE, THYROIDAB in the last 72 hours.  Invalid input(s): FREET3 ------------------------------------------------------------------------------------------------------------------ No results for input(s): VITAMINB12, FOLATE, FERRITIN, TIBC, IRON, RETICCTPCT in the last 72 hours.  Coagulation profile  Recent Labs Lab 05/31/16 1646  INR 1.17    No results for input(s): DDIMER in the last 72 hours.  Cardiac Enzymes No results for input(s): CKMB, TROPONINI, MYOGLOBIN in the last 168 hours.  Invalid input(s): CK ------------------------------------------------------------------------------------------------------------------    Component Value Date/Time   BNP 52.4 12/22/2015 1551    Inpatient Medications  Scheduled Meds: . allopurinol  200 mg Oral Daily  . aspirin  325 mg Oral Daily  . carvedilol  3.125 mg Oral BID WC  . enoxaparin (LOVENOX) injection  40 mg Subcutaneous Q24H  . ferrous sulfate  325 mg Oral Daily  . gabapentin  1,200 mg Oral QHS  .  gabapentin  600 mg Oral BID AC  . insulin aspart  0-15 Units Subcutaneous TID WC  . insulin aspart  0-5 Units Subcutaneous QHS  . insulin glargine  15 Units Subcutaneous QHS  . polyethylene glycol  17 g Oral Daily  . simvastatin  20 mg Oral QPM  . tamsulosin  0.4 mg Oral Daily   Continuous Infusions: . sodium chloride 75 mL/hr at 06/02/16  0352   PRN Meds:.acetaminophen **OR** acetaminophen, HYDROcodone-acetaminophen, morphine injection, ondansetron **OR** ondansetron (ZOFRAN) IV  Micro Results Recent Results (from the past 240 hour(s))  MRSA PCR Screening     Status: None   Collection Time: 06/01/16 12:25 AM  Result Value Ref Range Status   MRSA by PCR NEGATIVE NEGATIVE Final    Comment:        The GeneXpert MRSA Assay (FDA approved for NASAL specimens only), is one component of a comprehensive MRSA colonization surveillance program. It is not intended to diagnose MRSA infection nor to guide or monitor treatment for MRSA infections.   Culture, Urine     Status: Abnormal   Collection Time: 06/01/16 11:45 AM  Result Value Ref Range Status   Specimen Description URINE, CLEAN CATCH  Final   Special Requests NONE  Final   Culture <10,000 COLONIES/mL INSIGNIFICANT GROWTH (A)  Final   Report Status 06/02/2016 FINAL  Final    Radiology Reports Dg Chest 2 View  Result Date: 05/31/2016 CLINICAL DATA:  right foot pain. Right great and 2nd toes are black and hard with no cap refill, rt lower leg pain. Patient complains of pain x 1 week after catching his foot in a string in his blanket. History of DM, HTN, CKD, PVD, CVA. EXAM: CHEST  2 VIEW COMPARISON:  11/25/2015 FINDINGS: The heart size and mediastinal contours are within normal limits. Both lungs are clear. No pleural effusion or pneumothorax. The skeletal structures are intact. IMPRESSION: No active cardiopulmonary disease. Electronically Signed   By: Lajean Manes M.D.   On: 05/31/2016 18:26   Dg Foot Complete Right  Result Date: 05/31/2016 CLINICAL DATA:  right foot pain. Right great and 2nd toes are black and hard with no cap refill, rt lower leg pain. Patient complains of pain x 1 week after catching his foot in a string in his blanket. History of DM, HTN, CKD, PVD, CVA. EXAM: RIGHT FOOT COMPLETE - 3+ VIEW COMPARISON:  None. FINDINGS: No fracture. No bone lesion. No  bone resorption is seen to suggest osteomyelitis. There is minor asymmetric joint space narrowing at the first metatarsophalangeal joint. Small marginal osteophytes are also noted. No discrete erosions. Remaining joints are normally spaced and aligned. No soft tissue air.  There are vascular calcifications. IMPRESSION: 1. No fracture or dislocation. 2. No evidence of osteomyelitis.  No soft tissue air. 3. Minor first metatarsophalangeal joint osteoarthritis. Electronically Signed   By: Lajean Manes M.D.   On: 05/31/2016 18:25    Time Spent in minutes  25   Louellen Molder M.D on 06/02/2016 at 11:44 AM  Between 7am to 7pm - Pager - [phone]  After 7pm go to [email or web address] - password [facility]  [facility] -  Office  [phone]

## 2016-06-03 ENCOUNTER — Encounter (HOSPITAL_COMMUNITY)
Admission: EM | Disposition: A | Discharge status: Home / Self Care | Payer: Self-pay | Source: Home / Self Care | Attending: Internal Medicine

## 2016-06-03 HISTORY — PX: PERIPHERAL VASCULAR ATHERECTOMY: CATH118256

## 2016-06-03 HISTORY — PX: LOWER EXTREMITY ANGIOGRAPHY: CATH118251

## 2016-06-03 HISTORY — PX: ABDOMINAL AORTOGRAM: CATH118222

## 2016-06-03 LAB — BASIC METABOLIC PANEL
ANION GAP: 5 (ref 5–15)
BUN: 24 mg/dL — ABNORMAL HIGH (ref 6–20)
CALCIUM: 8.7 mg/dL — AB (ref 8.9–10.3)
CO2: 25 mmol/L (ref 22–32)
CREATININE: 0.92 mg/dL (ref 0.61–1.24)
Chloride: 102 mmol/L (ref 101–111)
Glucose, Bld: 193 mg/dL — ABNORMAL HIGH (ref 65–99)
Potassium: 4.9 mmol/L (ref 3.5–5.1)
Sodium: 132 mmol/L — ABNORMAL LOW (ref 135–145)

## 2016-06-03 LAB — GLUCOSE, CAPILLARY
GLUCOSE-CAPILLARY: 257 mg/dL — AB (ref 65–99)
Glucose-Capillary: 197 mg/dL — ABNORMAL HIGH (ref 65–99)
Glucose-Capillary: 153 mg/dL — ABNORMAL HIGH (ref 65–99)
Glucose-Capillary: 170 mg/dL — ABNORMAL HIGH (ref 65–99)

## 2016-06-03 LAB — POCT ACTIVATED CLOTTING TIME
ACTIVATED CLOTTING TIME: 235 s
ACTIVATED CLOTTING TIME: 197 s
Activated Clotting Time: 235 seconds
Activated Clotting Time: 252 seconds
Activated Clotting Time: 213 seconds

## 2016-06-03 SURGERY — ABDOMINAL AORTOGRAM
Anesthesia: LOCAL | Laterality: Right

## 2016-06-03 MED ORDER — HEPARIN SODIUM (PORCINE) 1000 UNIT/ML IJ SOLN
INTRAMUSCULAR | Status: DC | PRN
  Administered 2016-06-03: 4000 [IU] via INTRAVENOUS
  Administered 2016-06-03: 10000 [IU] via INTRAVENOUS

## 2016-06-03 MED ORDER — SODIUM CHLORIDE 0.9 % IV SOLN
INTRAVENOUS | Status: DC | PRN
  Administered 2016-06-03: 13:00:00 via SURGICAL_CAVITY

## 2016-06-03 MED ORDER — MIDAZOLAM HCL 2 MG/2ML IJ SOLN
INTRAMUSCULAR | Status: AC
  Filled 2016-06-03: qty 2

## 2016-06-03 MED ORDER — HEPARIN (PORCINE) IN NACL 2-0.9 UNIT/ML-% IJ SOLN
INTRAMUSCULAR | Status: AC
  Filled 2016-06-03: qty 1000

## 2016-06-03 MED ORDER — HEPARIN (PORCINE) IN NACL 2-0.9 UNIT/ML-% IJ SOLN
INTRAMUSCULAR | Status: DC | PRN
  Administered 2016-06-03: 1000 mL

## 2016-06-03 MED ORDER — FENTANYL CITRATE (PF) 100 MCG/2ML IJ SOLN
INTRAMUSCULAR | Status: AC
  Filled 2016-06-03: qty 2

## 2016-06-03 MED ORDER — HEPARIN SODIUM (PORCINE) 1000 UNIT/ML IJ SOLN
INTRAMUSCULAR | Status: AC
  Filled 2016-06-03: qty 1

## 2016-06-03 MED ORDER — LIDOCAINE HCL (PF) 1 % IJ SOLN
INTRAMUSCULAR | Status: DC | PRN
  Administered 2016-06-03: 15 mL

## 2016-06-03 MED ORDER — IODIXANOL 320 MG/ML IV SOLN
INTRAVENOUS | Status: DC | PRN
  Administered 2016-06-03: 160 mL via INTRAVENOUS

## 2016-06-03 MED ORDER — CLOPIDOGREL BISULFATE 300 MG PO TABS: 300.0000 mg | ORAL_TABLET | Freq: Once | ORAL | Status: DC

## 2016-06-03 MED ORDER — MORPHINE SULFATE (PF) 4 MG/ML IV SOLN
2.0000 mg | INTRAVENOUS | Status: DC | PRN
  Administered 2016-06-03: 2 mg via INTRAVENOUS

## 2016-06-03 MED ORDER — SODIUM CHLORIDE 0.9 % IV SOLN: 1.0000 mL/kg/h | INTRAVENOUS | Status: AC

## 2016-06-03 MED ORDER — OXYCODONE-ACETAMINOPHEN 5-325 MG PO TABS
ORAL_TABLET | ORAL | Status: AC
  Filled 2016-06-03: qty 2

## 2016-06-03 MED ORDER — MIDAZOLAM HCL 2 MG/2ML IJ SOLN
INTRAMUSCULAR | Status: DC | PRN
  Administered 2016-06-03: 0.5 mg via INTRAVENOUS
  Administered 2016-06-03: 1 mg via INTRAVENOUS

## 2016-06-03 MED ORDER — FENTANYL CITRATE (PF) 100 MCG/2ML IJ SOLN
INTRAMUSCULAR | Status: DC | PRN
  Administered 2016-06-03 (×3): 50 ug via INTRAVENOUS

## 2016-06-03 MED ORDER — CLOPIDOGREL BISULFATE 75 MG PO TABS
300.0000 mg | ORAL_TABLET | Freq: Once | ORAL | Status: AC
  Administered 2016-06-03: 300 mg via ORAL

## 2016-06-03 MED ORDER — LIDOCAINE HCL 1 % IJ SOLN
INTRAMUSCULAR | Status: AC
  Filled 2016-06-03: qty 20

## 2016-06-03 MED ORDER — MORPHINE SULFATE (PF) 4 MG/ML IV SOLN
INTRAVENOUS | Status: AC
  Filled 2016-06-03: qty 1

## 2016-06-03 MED ORDER — OXYCODONE-ACETAMINOPHEN 5-325 MG PO TABS
1.0000 | ORAL_TABLET | ORAL | Status: DC | PRN
  Administered 2016-06-03 – 2016-06-04 (×4): 2 via ORAL
  Filled 2016-06-03 (×3): qty 2

## 2016-06-03 MED ORDER — CLOPIDOGREL BISULFATE 75 MG PO TABS
75.0000 mg | ORAL_TABLET | Freq: Every day | ORAL | Status: DC
  Administered 2016-06-04: 75 mg via ORAL
  Filled 2016-06-03 (×3): qty 1

## 2016-06-03 SURGICAL SUPPLY — 29 items
BALLN ARMADA 35LL 5X250X135 (BALLOONS) ×3
BALLN COYOTE OTW 3X100X150 (BALLOONS) ×3
BALLN IN.PACT DCB 6X150 (BALLOONS) ×3
BALLOON ARMADA 35LL 5X250X135 (BALLOONS) ×2 IMPLANT
BALLOON COYOTE OTW 3X100X150 (BALLOONS) ×2 IMPLANT
BUR JETSTREAM XC 2.4/3.4 (BURR) ×2 IMPLANT
BURR JETSTREAM XC 2.4/3.4 (BURR) ×3
CATH OMNI FLUSH 5F 65CM (CATHETERS) ×3 IMPLANT
CATH QUICKCROSS SUPP .035X90CM (MICROCATHETER) ×3 IMPLANT
CATH TEMPO AQUA 5F 100CM (CATHETERS) ×3 IMPLANT
COVER PRB 48X5XTLSCP FOLD TPE (BAG) ×2 IMPLANT
COVER PROBE 5X48 (BAG) ×3
DCB IN.PACT 6X150 (BALLOONS) ×2 IMPLANT
DEVICE CONTINUOUS FLUSH (MISCELLANEOUS) ×3 IMPLANT
DEVICE EMBOSHIELD NAV6 4.0-7.0 (WIRE) ×3 IMPLANT
GUIDEWIRE ANGLED .035X260CM (WIRE) ×3 IMPLANT
KIT ENCORE 26 ADVANTAGE (KITS) ×3 IMPLANT
KIT MICROINTRODUCER STIFF 5F (SHEATH) ×3 IMPLANT
KIT PV (KITS) ×3 IMPLANT
LUBRICANT VIPERSLIDE CORONARY (MISCELLANEOUS) ×3 IMPLANT
SHEATH HIGHFLEX ANSEL 7FR 55CM (SHEATH) ×3 IMPLANT
SHEATH PINNACLE 5F 10CM (SHEATH) ×3 IMPLANT
STENT ABSOLUTE PRO 6X100X135 (Permanent Stent) ×3 IMPLANT
SYR MEDRAD MARK V 150ML (SYRINGE) ×3 IMPLANT
TRANSDUCER W/STOPCOCK (MISCELLANEOUS) ×3 IMPLANT
TRAY PV CATH (CUSTOM PROCEDURE TRAY) ×3 IMPLANT
WIRE BENTSON .035X145CM (WIRE) ×3 IMPLANT
WIRE HI TORQ VERSACORE J 260CM (WIRE) ×3 IMPLANT
WIRE VIPER ADVANCE .017X335CM (WIRE) ×3 IMPLANT

## 2016-06-03 NOTE — Op Note (Signed)
Patient name: KAYCEE MCGAUGH MRN: [id number] DOB: Jul 06, 1950 Sex: male  06/03/2016 Pre-operative Diagnosis: critical right lower extremity ischemia Post-operative diagnosis:  Same Surgeon:  Erlene Quan C. Donzetta Matters, MD Procedure Performed: 1.  US guided cannulation of left common femoral artery 2.  Aortogram with Right lower extremity angiogram 3.  Atherectomy with jetstream of Right SFA with distal embolic protection 4.  Drug Coated balloon angioplasty of Right SFA with 44mm balloon 5.  Stent of distal sfa with 6 x 100 Abbott 6.  Moderate Sedation for 95 minutes with fentanyl and versed   Indications:  66 year old male history of a left-sided below-knee amputation now presents with right foot critical limb ischemia. He is indicated for the above operation.  Findings: His aorta and iliac artery appeared patent. There was diffuse disease in the left common femoral artery by ultrasound the right common femoral artery appears to be diseased on angiogram without flow-limiting stenosis. There are multiple flow-limiting stenosis throughout the superficial femoral artery with a frank occlusion for approximately 5 cm in the distal aspect reconstituted distal SFA at adductor canal. There is possibly a flow-limiting stenosis of the tibioperoneal trunk as well. Following atherectomy with drug-coated balloon angioplasty was no flow through the SFA and so we performed stenting of the reentry site with 6 mm Abbott stent. At completion then there was no residual stenosis within the SFA where it was once occluded. At completion there was two-vessel runoff to the foot via the peroneal and posterior tibial arteries on the right.   Procedure:  The patient was identified in the holding area and taken to room 8.  The patient was then placed supine on the table and prepped and draped in the usual sterile fashion.  A time out was called.  Ultrasound was used to evaluate the left common femoral artery.  It was patent .  A  digital ultrasound image was acquired.  A micropuncture needle was used to access the left common femoral artery under ultrasound guidance.  An 018 wire was advanced without resistance and a micropuncture sheath was placed.  The 018 wire was removed and a benson wire was placed.  The micropuncture sheath was exchanged for a 5 french sheath.  An omniflush catheter was advanced over the wire to the level of L-1.  An abdominal angiogram was obtained.  Next, using the omniflush catheter and a benson wire, the aortic bifurcation was crossed and the catheter was placed into theright external iliac artery and right runoff was obtained.  With the findings above the patient was heparinized and we exchanged for long 7 French sheath. We then able to cross the occluded superficial femoral artery with Glidewire and quick cross catheter and confirmed intraluminal access. We then exchanged for a viper wire and placed a 4-7 Abbott distal protection device. Jetstream atherectomy was then performed of the SFA in its entirety. Distally we had some difficulty getting through and could not perform blades up. We then performed drug-coated balloon angioplasty with 5 mm balloon of the SFA. Completion demonstrated no flow within R SFA. With this we elected to stent the distal aspect of the SFA where likely reentry must have been. This was done with 6 mm x 10 cm Abbott stent. We postdilated WITH a 5 mm balloon. At completion there was what appeared to be a dissection at the distal SFA proximal popliteal artery. This was then balloon dilated with 5 mm balloon for prolonged inflation time. Completion now demonstrated no further dissection without flow-limiting  stenosis in the SFA. There was two-vessel runoff via the peroneal and posterior tibial artery to the level of the foot. Satisfied with this balloons and wires were removed sheath was retracted inferiorly external iliac artery on the left and will be pulled in the postoperative holding  area.   Contrast: 160cc  Talon Witting C. Donzetta Matters, MD [facility] [place] Office: [phone] Pager: [phone]

## 2016-06-03 NOTE — Progress Notes (Signed)
  Progress Note    06/03/2016 8:24 AM   Subjective: feeling ok this a.m.  Vitals:   06/03/16 0438 06/03/16 0506  BP: (!) 165/75 132/68  Pulse: (!) 108 89  Resp: 18   Temp: 98.3 F (36.8 C)     Physical Exam: aaox3 Non labored respirations Abdomen is soft R foot is cool with area of gangrenous change on dorsum and dusky appearing toes  CBC    Component Value Date/Time   WBC 11.0 (H) 06/02/2016 0356   RBC 3.45 (L) 06/02/2016 0356   HGB 8.4 (L) 06/02/2016 0356   HCT 28.3 (L) 06/02/2016 0356   HCT 26.3 (L) 11/26/2015 1440   HCT 32 10/31/2011 1319   PLT 413 (H) 06/02/2016 0356   MCV 82.0 06/02/2016 0356   MCV 82.8 10/31/2011 1319   MCH 24.3 (L) 06/02/2016 0356   MCHC 29.7 (L) 06/02/2016 0356   RDW 16.6 (H) 06/02/2016 0356   LYMPHSABS 3.0 05/31/2016 1646   MONOABS 1.0 05/31/2016 1646   EOSABS 0.1 05/31/2016 1646   BASOSABS 0.0 05/31/2016 1646    BMET    Component Value Date/Time   NA 132 (L) 06/03/2016 0501   NA 134 (A) 12/27/2015   K 4.9 06/03/2016 0501   K 4.0 10/31/2011 1319   CL 102 06/03/2016 0501   CO2 25 06/03/2016 0501   GLUCOSE 193 (H) 06/03/2016 0501   BUN 24 (H) 06/03/2016 0501   BUN 16 12/27/2015   CREATININE 0.92 06/03/2016 0501   CREATININE 1.06 12/22/2015 1551   CALCIUM 8.7 (L) 06/03/2016 0501   CALCIUM 9.0 10/31/2011 1319   GFRNONAA >60 06/03/2016 0501   GFRAA >60 06/03/2016 0501    INR    Component Value Date/Time   INR 1.17 05/31/2016 1646     Intake/Output Summary (Last 24 hours) at 06/03/16 0824 Last data filed at 06/03/16 9675  Gross per 24 hour  Intake          2693.75 ml  Output             2150 ml  Net           543.75 ml     Assessment:  66 y.o. male is here with gangrenous changes to toes, aki with Cr back to baseline now. ABI were undectable.  Plan: Aortogram with right lower extremity runoff today.  Discussed risks and benefits including possibility of no significant flow below the knee requiring proximal  amputation. Patient demonstrates good understanding.    Laureen Frederic C. Donzetta Matters, MD [facility] [place] Office: [phone] Pager: [phone]  06/03/2016 8:24 AM

## 2016-06-03 NOTE — Progress Notes (Signed)
PROGRESS NOTE                                                                                                                                                                                                             Patient Demographics:    Cory Weber, is a 66 y.o. male, DOB - 05-06-1950, BSJ:[id number]  Admit date - 05/31/2016   Admitting Physician Orson Eva, MD  Outpatient Primary MD for the patient is Maggie Font, MD  LOS - 3  Outpatient Specialists: [facility]  Chief Complaint  Patient presents with  . Circulatory Problem       Brief Narrative  66 year old male with history of peripheral vascular disease (BKA in 08/2015 after failure to revascularize attempt), hypertension, diabetes mellitus, history of stroke, chronic hep C and systolic CHF presented to [facility] ED with one-week history of right foot pain that has progressively worsened. He also noticed bleeding and drainage from his first and second toes. In the ED vitals were stable with leukocytosis (WBC of 16) and mild hyponatremia. X-ray of the right foot negative for fracture or osteomyelitis. Vascular surgery consulted for ischemic right foot.    Subjective:   Complaining of pain in his right leg. Got complains from some of the nursing staff that he was making obscene remarks to them yesterday.   Assessment  & Plan :   Principal problem Ischemic right foot with gangrene Vascular surgery following.. Exam today. Empiric Ancef. Pain control with Vicodin and low-dose morphine when necessary Continue Neurontin.  History of systolic CHF. Euvolemic. EF of 50-55% per last echo. ACE inhibitor and torsemide held given worsened renal function. Continue aspirin and Coreg.  History of stroke Continue aspirin and statin.  Diabetes mellitus type 2 uncontrolled Continue Lantus with sliding scale coverage  BPH Continue Flomax.  Acute kidney  injury. Resolved after holding Torsemide, ACE inhibitor and metformin. On gentle IV hydration.  Resume in a.m. if renal function stable.    Code Status : Full code  Family Communication  : None at bedside  Disposition Plan  : Pending hospital course  Barriers For Discharge : Right lower extremity arteriogram  Consults  : Vascular surgery (Dr. Donzetta Matters)  Procedures  : ABI Arteriogram  DVT Prophylaxis  :  Lovenox -  Lab Results  Component Value Date   PLT 413 (H)  06/02/2016    Antibiotics  :   Anti-infectives    Start     Dose/Rate Route Frequency Ordered Stop   06/02/16 1400  [MAR Hold]  ceFAZolin (ANCEF) IVPB 1 g/50 mL premix     (MAR Hold since 06/03/16 1046)   1 g 100 mL/hr over 30 Minutes Intravenous Every 8 hours 06/02/16 1153          Objective:   Vitals:   06/02/16 0346 06/02/16 1321 06/03/16 0438 06/03/16 0506  BP: (!) 122/58 114/63 (!) 165/75 132/68  Pulse: 84 94 (!) 108 89  Resp:  18 18   Temp: 98.3 F (36.8 C) 98.2 F (36.8 C) 98.3 F (36.8 C)   TempSrc: Oral Oral Oral   SpO2: 96% 100% 100%   Weight:      Height:        Wt Readings from Last 3 Encounters:  05/31/16 99.6 kg (219 lb 9.6 oz)  05/11/16 116.1 kg (256 lb)  04/24/16 116.5 kg (256 lb 12.8 oz)     Intake/Output Summary (Last 24 hours) at 06/03/16 1138 Last data filed at 06/03/16 6237  Gross per 24 hour  Intake          2693.75 ml  Output             2150 ml  Net           543.75 ml     Physical Exam  Gen: not in distress HEENT: moist mucosa, supple neck Chest: Clear to auscultation bilaterally CVS: Normal S1 and S2, no murmurs,  GI: soft, NT, ND, Musculoskeletal: Left BKA, absent right distal pulses     Data Review:    CBC  Recent Labs Lab 05/31/16 1646 06/01/16 0409 06/02/16 0356  WBC 16.0* 12.0* 11.0*  HGB 9.9* 9.8* 8.4*  HCT 31.7* 31.0* 28.3*  PLT 407* 437* 413*  MCV 80.7 80.7 82.0  MCH 25.2* 25.5* 24.3*  MCHC 31.2 31.6 29.7*  RDW 16.3* 16.9* 16.6*   LYMPHSABS 3.0  --   --   MONOABS 1.0  --   --   EOSABS 0.1  --   --   BASOSABS 0.0  --   --     Chemistries   Recent Labs Lab 05/31/16 1646 06/01/16 0409 06/02/16 0356 06/03/16 0501  NA 130* 131* 131* 132*  K 4.8 4.4 4.7 4.9  CL 96* 98* 97* 102  CO2 27 26 26 25   GLUCOSE 248* 163* 151* 193*  BUN 21* 21* 32* 24*  CREATININE 1.13 1.37* 1.55* 0.92  CALCIUM 9.2 9.3 8.8* 8.7*  AST 12*  --  21  --   ALT 16*  --  20  --   ALKPHOS 93  --  94  --   BILITOT 0.5  --  0.5  --    ------------------------------------------------------------------------------------------------------------------ No results for input(s): CHOL, HDL, LDLCALC, TRIG, CHOLHDL, LDLDIRECT in the last 72 hours.  Lab Results  Component Value Date   HGBA1C 9.0 (H) 06/01/2016   ------------------------------------------------------------------------------------------------------------------ No results for input(s): TSH, T4TOTAL, T3FREE, THYROIDAB in the last 72 hours.  Invalid input(s): FREET3 ------------------------------------------------------------------------------------------------------------------ No results for input(s): VITAMINB12, FOLATE, FERRITIN, TIBC, IRON, RETICCTPCT in the last 72 hours.  Coagulation profile  Recent Labs Lab 05/31/16 1646  INR 1.17    No results for input(s): DDIMER in the last 72 hours.  Cardiac Enzymes No results for input(s): CKMB, TROPONINI, MYOGLOBIN in the last 168 hours.  Invalid input(s): CK ------------------------------------------------------------------------------------------------------------------    Component  Value Date/Time   BNP 52.4 12/22/2015 1551    Inpatient Medications  Scheduled Meds: . [MAR Hold] allopurinol  200 mg Oral Daily  . [MAR Hold] aspirin  325 mg Oral Daily  . [MAR Hold] carvedilol  3.125 mg Oral BID WC  . [MAR Hold] enoxaparin (LOVENOX) injection  40 mg Subcutaneous Q24H  . [MAR Hold] ferrous sulfate  325 mg Oral Daily  .  [MAR Hold] gabapentin  1,200 mg Oral QHS  . [MAR Hold] gabapentin  600 mg Oral BID AC  . [MAR Hold] insulin aspart  0-15 Units Subcutaneous TID WC  . [MAR Hold] insulin aspart  0-5 Units Subcutaneous QHS  . [MAR Hold] insulin glargine  15 Units Subcutaneous QHS  . [MAR Hold] polyethylene glycol  17 g Oral Daily  . [MAR Hold] simvastatin  20 mg Oral QPM  . [MAR Hold] tamsulosin  0.4 mg Oral Daily   Continuous Infusions: . sodium chloride 75 mL/hr at 06/03/16 0533  . [MAR Hold]  ceFAZolin (ANCEF) IV Stopped (06/03/16 0618)  . heparin     PRN Meds:.[MAR Hold] acetaminophen **OR** [MAR Hold] acetaminophen, fentaNYL, heparin, heparin, [MAR Hold] HYDROcodone-acetaminophen, lidocaine (PF), midazolam, [MAR Hold]  morphine injection, [MAR Hold] ondansetron **OR** [MAR Hold] ondansetron (ZOFRAN) IV  Micro Results Recent Results (from the past 240 hour(s))  Culture, blood (Routine X 2) w Reflex to ID Panel     Status: None (Preliminary result)   Collection Time: 05/31/16 11:25 PM  Result Value Ref Range Status   Specimen Description BLOOD BLOOD RIGHT ARM  Final   Special Requests   Final    BOTTLES DRAWN AEROBIC AND ANAEROBIC Blood Culture adequate volume   Culture NO GROWTH 1 DAY  Final   Report Status PENDING  Incomplete  Culture, blood (Routine X 2) w Reflex to ID Panel     Status: None (Preliminary result)   Collection Time: 05/31/16 11:30 PM  Result Value Ref Range Status   Specimen Description BLOOD LEFT ANTECUBITAL  Final   Special Requests IN PEDIATRIC BOTTLE Blood Culture adequate volume  Final   Culture NO GROWTH 1 DAY  Final   Report Status PENDING  Incomplete  MRSA PCR Screening     Status: None   Collection Time: 06/01/16 12:25 AM  Result Value Ref Range Status   MRSA by PCR NEGATIVE NEGATIVE Final    Comment:        The GeneXpert MRSA Assay (FDA approved for NASAL specimens only), is one component of a comprehensive MRSA colonization surveillance program. It is  not intended to diagnose MRSA infection nor to guide or monitor treatment for MRSA infections.   Culture, Urine     Status: Abnormal   Collection Time: 06/01/16 11:45 AM  Result Value Ref Range Status   Specimen Description URINE, CLEAN CATCH  Final   Special Requests NONE  Final   Culture <10,000 COLONIES/mL INSIGNIFICANT GROWTH (A)  Final   Report Status 06/02/2016 FINAL  Final    Radiology Reports Dg Chest 2 View  Result Date: 05/31/2016 CLINICAL DATA:  right foot pain. Right great and 2nd toes are black and hard with no cap refill, rt lower leg pain. Patient complains of pain x 1 week after catching his foot in a string in his blanket. History of DM, HTN, CKD, PVD, CVA. EXAM: CHEST  2 VIEW COMPARISON:  11/25/2015 FINDINGS: The heart size and mediastinal contours are within normal limits. Both lungs are clear. No pleural effusion or pneumothorax. The  skeletal structures are intact. IMPRESSION: No active cardiopulmonary disease. Electronically Signed   By: Lajean Manes M.D.   On: 05/31/2016 18:26   Dg Foot Complete Right  Result Date: 05/31/2016 CLINICAL DATA:  right foot pain. Right great and 2nd toes are black and hard with no cap refill, rt lower leg pain. Patient complains of pain x 1 week after catching his foot in a string in his blanket. History of DM, HTN, CKD, PVD, CVA. EXAM: RIGHT FOOT COMPLETE - 3+ VIEW COMPARISON:  None. FINDINGS: No fracture. No bone lesion. No bone resorption is seen to suggest osteomyelitis. There is minor asymmetric joint space narrowing at the first metatarsophalangeal joint. Small marginal osteophytes are also noted. No discrete erosions. Remaining joints are normally spaced and aligned. No soft tissue air.  There are vascular calcifications. IMPRESSION: 1. No fracture or dislocation. 2. No evidence of osteomyelitis.  No soft tissue air. 3. Minor first metatarsophalangeal joint osteoarthritis. Electronically Signed   By: Lajean Manes M.D.   On: 05/31/2016  18:25    Time Spent in minutes  25   Louellen Molder M.D on 06/03/2016 at 11:38 AM  Between 7am to 7pm - Pager - [phone]  After 7pm go to [email or web address] - password [facility]  [facility] -  Office  [phone]

## 2016-06-03 NOTE — Progress Notes (Signed)
Inpatient Diabetes Program Recommendations  AACE/ADA: New Consensus Statement on Inpatient Glycemic Control (2015)  Target Ranges:  Prepandial:   less than 140 mg/dL      Peak postprandial:   less than 180 mg/dL (1-2 hours)      Critically ill patients:  140 - 180 mg/dL   Results for RENSO, Cory Weber (MRN [id number]) as of 06/03/2016 14:03  Ref. Range 06/02/2016 06:04 06/02/2016 11:21 06/02/2016 16:07 06/02/2016 21:29  Glucose-Capillary Latest Ref Range: 65 - 99 mg/dL 185 (H) 218 (H) 184 (H) 324 (H)   Results for Cory Weber, Cory Weber (MRN [id number]) as of 06/03/2016 14:03  Ref. Range 06/03/2016 06:11 06/03/2016 13:28  Glucose-Capillary Latest Ref Range: 65 - 99 mg/dL 197 (H) 153 (H)   Results for Cory Weber, Cory Weber (MRN [id number]) as of 06/03/2016 14:03  Ref. Range 06/01/2016 04:09  Hemoglobin A1C Latest Ref Range: 4.8 - 5.6 % 9.0 (H)    Admit with: Critical Limb Ischemia  History: DM, CVA, CHF  Home DM Meds: Lantus 55 units QHS       Novolog 22 units TID       Metformin 500 mg daily  Current Insulin Orders: Lantus 15 units QHS      Novolog Moderate Correction Scale/ SSI (0-15 units) TID AC + HS       MD- Note patient NPO for Vascular procedure today.  Has been eating 100% of meals per documentation.    After procedure today, please consider the following in-hospital insulin adjustments:  1. Increase Lantus to 20 units QHS  2. Start Novolog Meal Coverage: Novolog 5 units TID with meals (hold if pt eats <50% of meal)  This would be about 25% of total home dose of Novolog     --Will follow patient during hospitalization--  Wyn Quaker RN, MSN, CDE Diabetes Coordinator Inpatient Glycemic Control Team Team Pager: [phone] (8a-5p)

## 2016-06-03 NOTE — Progress Notes (Signed)
While ACT was running, patient accidentally removed sheath. Result was 197 sec. Pressure being held by [employer].No hematoma noted at this time.

## 2016-06-03 NOTE — Progress Notes (Signed)
Site area: Insurance underwriter Prior to Removal:  Level 0 Pressure Applied For: 45 min Manual:  yes  Patient Status During Pull:  A/O Post Pull Site:  Level 0 Post Pull Instructions Given: Yes, and pt understands post instructions  Post Pull Pulses Present: doppler rt pero Dressing Applied:  Pressure dressing applied Bedrest begins @ 15:50:00 Comments: pt leaves cath lab holding in stable condition. Lt groin is CDI. No bruising or hematoma.

## 2016-06-03 NOTE — Progress Notes (Signed)
Pt verbalized bedrest until 6pm. Site clean, dry, intact. VSS. Placed on frequent vitals, pt continues to remove even after being educated on the importance. Assisted pt to order dinner tray, [place] sandwich at bedside from cath lab. Call bell within reach, will continue to monitor. Given 300 Plavix one time dose per Dr. Claretha Cooper instructions.   Fritz Pickerel, RN

## 2016-06-03 NOTE — Progress Notes (Signed)
Pt was instructed that his vitals needed to be monitored frequently after he returned from the cath lab. Pt continued to remove BP cuff, even after being educated; therefore pt's vitals cannot be documented.   Grant Fontana BSN, RN

## 2016-06-04 ENCOUNTER — Encounter (HOSPITAL_COMMUNITY): Payer: Self-pay | Admitting: Vascular Surgery

## 2016-06-04 DIAGNOSIS — Z720 Tobacco use: Secondary | ICD-10-CM

## 2016-06-04 DIAGNOSIS — I739 Peripheral vascular disease, unspecified: Secondary | ICD-10-CM

## 2016-06-04 DIAGNOSIS — M79671 Pain in right foot: Secondary | ICD-10-CM

## 2016-06-04 DIAGNOSIS — I999 Unspecified disorder of circulatory system: Secondary | ICD-10-CM

## 2016-06-04 DIAGNOSIS — N183 Chronic kidney disease, stage 3 (moderate): Secondary | ICD-10-CM

## 2016-06-04 DIAGNOSIS — I998 Other disorder of circulatory system: Secondary | ICD-10-CM | POA: Diagnosis present

## 2016-06-04 DIAGNOSIS — B182 Chronic viral hepatitis C: Secondary | ICD-10-CM

## 2016-06-04 LAB — CBC
HEMATOCRIT: 27.7 % — AB (ref 39.0–52.0)
Hemoglobin: 8.2 g/dL — ABNORMAL LOW (ref 13.0–17.0)
MCH: 24.3 pg — ABNORMAL LOW (ref 26.0–34.0)
MCHC: 29.6 g/dL — ABNORMAL LOW (ref 30.0–36.0)
MCV: 82 fL (ref 78.0–100.0)
PLATELETS: 384 10*3/uL (ref 150–400)
RBC: 3.38 MIL/uL — ABNORMAL LOW (ref 4.22–5.81)
RDW: 16.5 % — AB (ref 11.5–15.5)
WBC: 11 10*3/uL — AB (ref 4.0–10.5)

## 2016-06-04 LAB — BASIC METABOLIC PANEL
Anion gap: 6 (ref 5–15)
BUN: 13 mg/dL (ref 6–20)
CO2: 26 mmol/L (ref 22–32)
CREATININE: 0.94 mg/dL (ref 0.61–1.24)
Calcium: 9.1 mg/dL (ref 8.9–10.3)
Chloride: 101 mmol/L (ref 101–111)
GFR calc Af Amer: >60 mL/min (ref >60)
Glucose, Bld: 186 mg/dL — ABNORMAL HIGH (ref 65–99)
POTASSIUM: 5.1 mmol/L (ref 3.5–5.1)
SODIUM: 133 mmol/L — AB (ref 135–145)

## 2016-06-04 LAB — GLUCOSE, CAPILLARY
Glucose-Capillary: 207 mg/dL — ABNORMAL HIGH (ref 65–99)
Glucose-Capillary: 157 mg/dL — ABNORMAL HIGH (ref 65–99)

## 2016-06-04 MED ORDER — INSULIN ASPART 100 UNIT/ML ~~LOC~~ SOLN
5.0000 [IU] | Freq: Three times a day (TID) | SUBCUTANEOUS | Status: DC
  Administered 2016-06-04: 5 [IU] via SUBCUTANEOUS

## 2016-06-04 MED ORDER — CLOPIDOGREL BISULFATE 75 MG PO TABS: 75.0000 mg | ORAL_TABLET | Freq: Every day | ORAL | 0 refills | Status: DC

## 2016-06-04 MED ORDER — POLYETHYLENE GLYCOL 3350 17 G PO PACK: 17.0000 g | PACK | Freq: Every day | ORAL | 0 refills | Status: DC

## 2016-06-04 MED ORDER — HYDROCODONE-ACETAMINOPHEN 5-325 MG PO TABS: 2.0000 | ORAL_TABLET | ORAL | 0 refills | Status: DC | PRN

## 2016-06-04 MED ORDER — INSULIN GLARGINE 100 UNIT/ML ~~LOC~~ SOLN: 20.0000 [IU] | Freq: Every day | SUBCUTANEOUS | Status: DC

## 2016-06-04 NOTE — Discharge Summary (Addendum)
Physician Discharge Summary  Cory Weber JKK:[id number] DOB: 11-11-1950 DOA: 05/31/2016  PCP: Maggie Font, MD  Admit date: 05/31/2016 Discharge date: 06/04/2016  Admitted From: Home Disposition:  Home  Recommendations for Outpatient Follow-up:  1. Follow up with PCP in 1-2 weeks 2. Follow-up with vascular surgeon Dr. Donzetta Matters in 4 weeks. 3. Patient needs to be on aspirin and Plavix for at least 3 months.  Home Health: None Equipment/Devices: Wheelchair, bedside commode  Discharge Condition: Fair CODE STATUS: Full code Diet recommendation: Heart Healthy / Carb Modified    Discharge Diagnoses:  Principal Problem:   Ischemia of right foot   Active Problems:   PVD (peripheral vascular disease) (HCC)   Tobacco abuse   Chronic kidney disease, stage 3   Essential hypertension   Chronic hepatitis C without hepatic coma (HCC)   BPH (benign prostatic hyperplasia)   Uncontrolled type II diabetes with peripheral autonomic neuropathy (HCC)   Ischemic pain of right foot  Brief narrative/history of present illness Please refer to admission H&P for details, in brief, 66 year old male with history of peripheral vascular disease (BKA in 08/2015 after failure to revascularize attempt), hypertension, diabetes mellitus, history of stroke, chronic hep C and systolic CHF presented to [facility] ED with one-week history of right foot pain that has progressively worsened. He also noticed bleeding and drainage from his first and second toes. In the ED vitals were stable with leukocytosis (WBC of 16) and mild hyponatremia. X-ray of the right foot negative for fracture or osteomyelitis. Vascular surgery consulted for ischemic right foot.  Hospital course Principal problem Ischemic right foot with gangrene Seen by vascular surgery. Patient underwent aortogram with right lower extremity angiogram. He then had atherectomy of right SFA and a drug-coated balloon angioplasty of right SFA done followed by  stent of distal SFA. Patient tolerated procedure well. He was started on Plavix and recommend aspirin and Plavix for at least 3 months. Recommend wound care to right foot with better Dean paint and protective shoe. Pain control with Vicodin as needed. Continue Neurontin.  Follow-up with vascular surgery in 4 weeks.  History of systolic CHF. Euvolemic. EF of 50-55% per last echo. ACE inhibitor and torsemide temporarily held given worsened renal function. Resumed on discharge. Continue aspirin and Coreg.  History of stroke Continue aspirin and statin.  Diabetes mellitus type 2 uncontrolled CBG elevated. A1c of 9. Patient on high dose Lantus and male aspart at home which is resumed. Continue metformin. Needs tight blood glucose control as outpatient.   BPH Continue Flomax.  Acute kidney injury. Resolved after holding Torsemide, ACE inhibitor and metformin. Received gentle hydration. Resumedhome medications upon discharge.  H/O  left BKA Patient had follow-up appointment with Dr. Trula Slade today. Vascular surgery team aware and recommend follow-up with Dr. Donzetta Matters in 4 weeks would be adequate.    Family Communication  : None at bedside  Disposition Plan  :  home  Consults  : Vascular surgery (Dr. Donzetta Matters)  Procedures  : ABI Arteriogram with atherectomy of right SFA and drug-coated balloon angioplasty.   Discharge Instructions   Allergies as of 06/04/2016   No Known Allergies     Medication List    TAKE these medications   allopurinol 100 MG tablet Commonly known as:  ZYLOPRIM Give 2 tablet by mouth every evening   aspirin 325 MG tablet Take 325 mg by mouth daily.   carvedilol 3.125 MG tablet Commonly known as:  COREG Take 1 tablet (3.125 mg total) by mouth  2 (two) times daily with a meal.   clopidogrel 75 MG tablet Commonly known as:  PLAVIX Take 1 tablet (75 mg total) by mouth daily with breakfast. Start taking on:  06/05/2016   ferrous sulfate 325  (65 FE) MG tablet Take 1 tablet (325 mg total) by mouth 3 (three) times daily with meals. What changed:  when to take this   gabapentin 600 MG tablet Commonly known as:  NEURONTIN Take 600-1,200 mg by mouth 3 (three) times daily. Take one tablet twice daily and take 2 tablets at bedtime   HYDROcodone-acetaminophen 5-325 MG tablet Commonly known as:  NORCO/VICODIN Take 2 tablets by mouth every 4 (four) hours as needed for moderate pain.   insulin aspart 100 UNIT/ML injection Commonly known as:  novoLOG Inject 22 Units into the skin 3 (three) times daily before meals.   insulin glargine 100 UNIT/ML injection Commonly known as:  LANTUS Inject 55 Units into the skin at bedtime.   lisinopril 5 MG tablet Commonly known as:  PRINIVIL,ZESTRIL Take 5 mg by mouth daily.   magnesium hydroxide 400 MG/5ML suspension Commonly known as:  MILK OF MAGNESIA Take 30 mLs by mouth daily as needed for mild constipation.   metFORMIN 500 MG tablet Commonly known as:  GLUCOPHAGE Take 500 mg by mouth daily with breakfast.   methocarbamol 500 MG tablet Commonly known as:  ROBAXIN Take 500 mg by mouth every 6 (six) hours.   polyethylene glycol packet Commonly known as:  MIRALAX / GLYCOLAX Take 17 g by mouth daily. Start taking on:  06/05/2016   simvastatin 20 MG tablet Commonly known as:  ZOCOR Take 20 mg by mouth every evening.   tamsulosin 0.4 MG Caps capsule Commonly known as:  FLOMAX Take 1 capsule (0.4 mg total) by mouth daily.   torsemide 20 MG tablet Commonly known as:  DEMADEX Take 20 mg by mouth 2 (two) times daily.            Durable Medical Equipment        Start     Ordered   06/04/16 [phone]  For home use only DME Bedside commode  Once    Comments:  Needs drop arm BSC  hx CVA, PVD  Question:  Patient needs a bedside commode to treat with the following condition  Answer:  Weakness   06/04/16 0954     Follow-up Information    Servando Snare, MD Follow up in 4 week(s).    Specialties:  Vascular Surgery, Cardiology Why:  Our office will call you to arrange an appointment (sent) Contact information: [place] [address] [phone]        Maggie Font, MD Follow up in 1 week(s).   Specialty:  Family Medicine Contact information: [address] [place] [address] [phone]          No Known Allergies   Procedures/Studies: Dg Chest 2 View  Result Date: 05/31/2016 CLINICAL DATA:  right foot pain. Right great and 2nd toes are black and hard with no cap refill, rt lower leg pain. Patient complains of pain x 1 week after catching his foot in a string in his blanket. History of DM, HTN, CKD, PVD, CVA. EXAM: CHEST  2 VIEW COMPARISON:  11/25/2015 FINDINGS: The heart size and mediastinal contours are within normal limits. Both lungs are clear. No pleural effusion or pneumothorax. The skeletal structures are intact. IMPRESSION: No active cardiopulmonary disease. Electronically Signed   By: Lajean Manes  M.D.   On: 05/31/2016 18:26   Dg Foot Complete Right  Result Date: 05/31/2016 CLINICAL DATA:  right foot pain. Right great and 2nd toes are black and hard with no cap refill, rt lower leg pain. Patient complains of pain x 1 week after catching his foot in a string in his blanket. History of DM, HTN, CKD, PVD, CVA. EXAM: RIGHT FOOT COMPLETE - 3+ VIEW COMPARISON:  None. FINDINGS: No fracture. No bone lesion. No bone resorption is seen to suggest osteomyelitis. There is minor asymmetric joint space narrowing at the first metatarsophalangeal joint. Small marginal osteophytes are also noted. No discrete erosions. Remaining joints are normally spaced and aligned. No soft tissue air.  There are vascular calcifications. IMPRESSION: 1. No fracture or dislocation. 2. No evidence of osteomyelitis.  No soft tissue air. 3. Minor first metatarsophalangeal joint osteoarthritis. Electronically Signed   By: Lajean Manes M.D.   On: 05/31/2016 18:25     (Echo, Carotid, EGD, Colonoscopy, ERCP)    Subjective:   Discharge Exam: Vitals:   06/03/16 2144 06/04/16 0440  BP: (!) 166/85 (!) 168/97  Pulse: (!) 105 80  Resp:  18  Temp: 98.5 F (36.9 C) 99.1 F (37.3 C)   Vitals:   06/03/16 1600 06/03/16 1635 06/03/16 2144 06/04/16 0440  BP: (!) 154/88 (!) 157/75 (!) 166/85 (!) 168/97  Pulse: 98 (!) 103 (!) 105 80  Resp: 16 16  18   Temp:   98.5 F (36.9 C) 99.1 F (37.3 C)  TempSrc:   Oral Oral  SpO2: 100% 99% 99% 100%  Weight:      Height:        Gen: not in distress HEENT: moist mucosa, supple neck Chest: Clear to auscultation bilaterally CVS: Normal S1 and S2, no murmurs,  GI: soft, NT, ND, Musculoskeletal: Left BKA, right distal pulses not palpable with ischemic changes in the foot.    The results of significant diagnostics from this hospitalization (including imaging, microbiology, ancillary and laboratory) are listed below for reference.     Microbiology: Recent Results (from the past 240 hour(s))  Culture, blood (Routine X 2) w Reflex to ID Panel     Status: None (Preliminary result)   Collection Time: 05/31/16 11:25 PM  Result Value Ref Range Status   Specimen Description BLOOD BLOOD RIGHT ARM  Final   Special Requests   Final    BOTTLES DRAWN AEROBIC AND ANAEROBIC Blood Culture adequate volume   Culture NO GROWTH 2 DAYS  Final   Report Status PENDING  Incomplete  Culture, blood (Routine X 2) w Reflex to ID Panel     Status: None (Preliminary result)   Collection Time: 05/31/16 11:30 PM  Result Value Ref Range Status   Specimen Description BLOOD LEFT ANTECUBITAL  Final   Special Requests IN PEDIATRIC BOTTLE Blood Culture adequate volume  Final   Culture NO GROWTH 2 DAYS  Final   Report Status PENDING  Incomplete  MRSA PCR Screening     Status: None   Collection Time: 06/01/16 12:25 AM  Result Value Ref Range Status   MRSA by PCR NEGATIVE NEGATIVE Final    Comment:        The GeneXpert MRSA Assay  (FDA approved for NASAL specimens only), is one component of a comprehensive MRSA colonization surveillance program. It is not intended to diagnose MRSA infection nor to guide or monitor treatment for MRSA infections.   Culture, Urine     Status: Abnormal   Collection Time:  06/01/16 11:45 AM  Result Value Ref Range Status   Specimen Description URINE, CLEAN CATCH  Final   Special Requests NONE  Final   Culture <10,000 COLONIES/mL INSIGNIFICANT GROWTH (A)  Final   Report Status 06/02/2016 FINAL  Final     Labs: BNP (last 3 results)  Recent Labs  12/22/15 1551  BNP 02.6   Basic Metabolic Panel:  Recent Labs Lab 05/31/16 1646 06/01/16 0409 06/02/16 0356 06/03/16 0501 06/04/16 0227  NA 130* 131* 131* 132* 133*  K 4.8 4.4 4.7 4.9 5.1  CL 96* 98* 97* 102 101  CO2 27 26 26 25 26   GLUCOSE 248* 163* 151* 193* 186*  BUN 21* 21* 32* 24* 13  CREATININE 1.13 1.37* 1.55* 0.92 0.94  CALCIUM 9.2 9.3 8.8* 8.7* 9.1   Liver Function Tests:  Recent Labs Lab 05/31/16 1646 06/02/16 0356  AST 12* 21  ALT 16* 20  ALKPHOS 93 94  BILITOT 0.5 0.5  PROT 9.3* 8.3*  ALBUMIN 3.5 2.9*    Recent Labs Lab 05/31/16 1646  LIPASE 17   No results for input(s): AMMONIA in the last 168 hours. CBC:  Recent Labs Lab 05/31/16 1646 06/01/16 0409 06/02/16 0356 06/04/16 0227  WBC 16.0* 12.0* 11.0* 11.0*  NEUTROABS 11.9*  --   --   --   HGB 9.9* 9.8* 8.4* 8.2*  HCT 31.7* 31.0* 28.3* 27.7*  MCV 80.7 80.7 82.0 82.0  PLT 407* 437* 413* 384   Cardiac Enzymes: No results for input(s): CKTOTAL, CKMB, CKMBINDEX, TROPONINI in the last 168 hours. BNP: Invalid input(s): POCBNP CBG:  Recent Labs Lab 06/03/16 0611 06/03/16 1328 06/03/16 1702 06/03/16 2140 06/04/16 0701  GLUCAP 197* 153* 170* 257* 207*   D-Dimer No results for input(s): DDIMER in the last 72 hours. Hgb A1c No results for input(s): HGBA1C in the last 72 hours. Lipid Profile No results for input(s): CHOL, HDL,  LDLCALC, TRIG, CHOLHDL, LDLDIRECT in the last 72 hours. Thyroid function studies No results for input(s): TSH, T4TOTAL, T3FREE, THYROIDAB in the last 72 hours.  Invalid input(s): FREET3 Anemia work up No results for input(s): VITAMINB12, FOLATE, FERRITIN, TIBC, IRON, RETICCTPCT in the last 72 hours. Urinalysis    Component Value Date/Time   COLORURINE YELLOW 06/01/2016 1144   APPEARANCEUR HAZY (A) 06/01/2016 1144   LABSPEC 1.016 06/01/2016 1144   PHURINE 5.0 06/01/2016 1144   GLUCOSEU 50 (A) 06/01/2016 1144   HGBUR NEGATIVE 06/01/2016 1144   BILIRUBINUR NEGATIVE 06/01/2016 1144   KETONESUR NEGATIVE 06/01/2016 1144   PROTEINUR NEGATIVE 06/01/2016 1144   UROBILINOGEN 1.0 11/29/2013 1705   NITRITE NEGATIVE 06/01/2016 1144   LEUKOCYTESUR NEGATIVE 06/01/2016 1144   Sepsis Labs Invalid input(s): PROCALCITONIN,  WBC,  LACTICIDVEN Microbiology Recent Results (from the past 240 hour(s))  Culture, blood (Routine X 2) w Reflex to ID Panel     Status: None (Preliminary result)   Collection Time: 05/31/16 11:25 PM  Result Value Ref Range Status   Specimen Description BLOOD BLOOD RIGHT ARM  Final   Special Requests   Final    BOTTLES DRAWN AEROBIC AND ANAEROBIC Blood Culture adequate volume   Culture NO GROWTH 2 DAYS  Final   Report Status PENDING  Incomplete  Culture, blood (Routine X 2) w Reflex to ID Panel     Status: None (Preliminary result)   Collection Time: 05/31/16 11:30 PM  Result Value Ref Range Status   Specimen Description BLOOD LEFT ANTECUBITAL  Final   Special Requests IN PEDIATRIC BOTTLE  Blood Culture adequate volume  Final   Culture NO GROWTH 2 DAYS  Final   Report Status PENDING  Incomplete  MRSA PCR Screening     Status: None   Collection Time: 06/01/16 12:25 AM  Result Value Ref Range Status   MRSA by PCR NEGATIVE NEGATIVE Final    Comment:        The GeneXpert MRSA Assay (FDA approved for NASAL specimens only), is one component of a comprehensive MRSA  colonization surveillance program. It is not intended to diagnose MRSA infection nor to guide or monitor treatment for MRSA infections.   Culture, Urine     Status: Abnormal   Collection Time: 06/01/16 11:45 AM  Result Value Ref Range Status   Specimen Description URINE, CLEAN CATCH  Final   Special Requests NONE  Final   Culture <10,000 COLONIES/mL INSIGNIFICANT GROWTH (A)  Final   Report Status 06/02/2016 FINAL  Final     Time coordinating discharge: Over 30 minutes  SIGNED:   Louellen Molder, MD  [facility] 06/04/2016, 10:20 AM Pager   If 7PM-7AM, please contact night-coverage [email or web address] Password [email or web address]

## 2016-06-04 NOTE — Progress Notes (Signed)
  Progress Note    06/04/2016 8:32 AM 1 Day Post-Op  Subjective:  Right foot feeling better  Vitals:   06/03/16 2144 06/04/16 0440  BP: (!) 166/85 (!) 168/97  Pulse: (!) 105 80  Resp:  18  Temp: 98.5 F (36.9 C) 99.1 F (37.3 C)    Physical Exam: [id number] Non labored respirations Left groin soft without hematoma R foot has peroneal signal, weak monophasic pt  Right foot with stable ischemic changes  CBC    Component Value Date/Time   WBC 11.0 (H) 06/04/2016 0227   RBC 3.38 (L) 06/04/2016 0227   HGB 8.2 (L) 06/04/2016 0227   HCT 27.7 (L) 06/04/2016 0227   HCT 26.3 (L) 11/26/2015 1440   HCT 32 10/31/2011 1319   PLT 384 06/04/2016 0227   MCV 82.0 06/04/2016 0227   MCV 82.8 10/31/2011 1319   MCH 24.3 (L) 06/04/2016 0227   MCHC 29.6 (L) 06/04/2016 0227   RDW 16.5 (H) 06/04/2016 0227   LYMPHSABS 3.0 05/31/2016 1646   MONOABS 1.0 05/31/2016 1646   EOSABS 0.1 05/31/2016 1646   BASOSABS 0.0 05/31/2016 1646    BMET    Component Value Date/Time   NA 133 (L) 06/04/2016 0227   NA 134 (A) 12/27/2015   K 5.1 06/04/2016 0227   K 4.0 10/31/2011 1319   CL 101 06/04/2016 0227   CO2 26 06/04/2016 0227   GLUCOSE 186 (H) 06/04/2016 0227   BUN 13 06/04/2016 0227   BUN 16 12/27/2015   CREATININE 0.94 06/04/2016 0227   CREATININE 1.06 12/22/2015 1551   CALCIUM 9.1 06/04/2016 0227   CALCIUM 9.0 10/31/2011 1319   GFRNONAA >60 06/04/2016 0227   GFRAA >60 06/04/2016 0227    INR    Component Value Date/Time   INR 1.17 05/31/2016 1646     Intake/Output Summary (Last 24 hours) at 06/04/16 1610 Last data filed at 06/04/16 0820  Gross per 24 hour  Intake              460 ml  Output             1100 ml  Net             -640 ml     Assessment:  66 y.o. male is s/p stent of distal R sfa with dcb and atherectomy of proximal. Foot feels subjectively better  Plan: Continue asa and plavix for at least 3 months f/u in 4 weeks in vascular office with abi and rle duplex Wound  care to right foot with betadine paint and protective shoe discussed, will allow wound to demarcate prior to amputation.    Muaaz Brau C. Donzetta Matters, MD [facility] Office: [phone] Pager: [phone]  06/04/2016 8:32 AM

## 2016-06-04 NOTE — Progress Notes (Signed)
Pt has been discharged home. IV and telemetry box removed. Pt left with all of his belongings. Pt received discharge instructions and all questions were answered. Pt received prescriptions and was instructed to get them filled at his pharmacy; pt verbalized understanding. Pt transported home via [place].   Grant Fontana BSN, RN

## 2016-06-04 NOTE — Progress Notes (Signed)
CSW was consulted for transportation's needs Patent needed assistance with returning home since his wheelchair is at home and he is not mobile without. CSW contacted [facility] and arrange transport from [facility] to patients home.   Rhea Pink, MSW,  [place]

## 2016-06-04 NOTE — Care Management Note (Addendum)
Case Management Note Marvetta Gibbons RN, BSN Unit 2W-Case Manager [phone]  Patient Details  Name: Cory Weber MRN: [id number] Date of Birth: 05-27-1950  Subjective/Objective:  Pt admitted s/p stent of distal R sfa with dcb and atherectomy of proximal                   Action/Plan: PTA pt lived at home alone, has family to assist PRN, also has PCS aide that assist 3-4 hrs in am and 3-4 hrs in pm. - pt has w/c at home- per PT recommendations BSC ordered - [facility] does not have the drop arm in house- pt will need to go by store to pick up- pt given printed order to take to [facility] store for [facility] with address and phone #. Under Medicaid guidelines pt does not qualify for any HH therapy. No other CM needs noted for discharge.   Expected Discharge Date:  06/04/16               Expected Discharge Plan:  Home/Self Care  In-House Referral:     Discharge planning Services  CM Consult  Post Acute Care Choice:  Durable Medical Equipment Choice offered to:  Patient  DME Arranged:  Bedside commode DME Agency:  [place]:  NA [place] Agency:  NA  Status of Service:  Completed, signed off  If discussed at [place] of Stay Meetings, dates discussed:    Discharge Disposition: home/self care   Additional Comments:  Update-1130- Marvetta Gibbons - was notified that pt now does not have a ride home- sister- Joaquim Lai here at bedside- but can not get pt home- has a funeral to get to and pt's w/c at home- [phone])- sister is agreeable to go pick up [facility] for pt at [facility] store- sister given printed out order in order to pick up DME. CSW to assist with transportation home- as pt does not have his w/c here- will arrange non-emergent EMS transport.   Dawayne Patricia, RN 06/04/2016, 10:55 AM

## 2016-06-05 ENCOUNTER — Encounter: Payer: Self-pay | Admitting: Surgery

## 2016-06-06 LAB — CULTURE, BLOOD (ROUTINE X 2)
Culture: NO GROWTH
Culture: NO GROWTH
Special Requests: ADEQUATE
Special Requests: ADEQUATE

## 2016-06-07 ENCOUNTER — Telehealth: Payer: Self-pay | Admitting: Vascular Surgery

## 2016-06-07 NOTE — Telephone Encounter (Signed)
Scheduled 6/14 for labs and office visit on 6/20

## 2016-06-07 NOTE — Telephone Encounter (Signed)
-----   Message from Mena Goes, RN sent at 06/04/2016 11:01 AM EDT ----- Regarding: duplicate I think?   ----- Message ----- From: [name], PA-C Sent: 06/04/2016   9:51 AM To: Vvs Charge Pool  s/p stent of distal R sfa with dcb and atherectomy of proximal 06/04/16  f/u with Dr. Donzetta Matters in 4 weeks with RLE arterial duplex and ABIs. Please cancel appt with [facility] on 06/17/16  Thanks Maudie Mercury

## 2016-06-10 ENCOUNTER — Encounter: Payer: Medicaid Other | Attending: Physical Medicine & Rehabilitation | Admitting: Physical Medicine & Rehabilitation

## 2016-06-10 ENCOUNTER — Emergency Department (HOSPITAL_COMMUNITY): Payer: Medicaid Other

## 2016-06-10 ENCOUNTER — Inpatient Hospital Stay (HOSPITAL_COMMUNITY)
Admission: EM | Admit: 2016-06-10 | Discharge: 2016-06-17 | DRG: 240 | Disposition: A | Discharge status: Skilled Nursing Facility | Payer: Medicaid Other | Attending: Internal Medicine | Admitting: Internal Medicine

## 2016-06-10 ENCOUNTER — Encounter (HOSPITAL_COMMUNITY): Payer: Self-pay | Admitting: Emergency Medicine

## 2016-06-10 ENCOUNTER — Encounter: Payer: Self-pay | Admitting: Physical Medicine & Rehabilitation

## 2016-06-10 VITALS — BP 136/77 | HR 90

## 2016-06-10 DIAGNOSIS — I739 Peripheral vascular disease, unspecified: Secondary | ICD-10-CM | POA: Diagnosis not present

## 2016-06-10 DIAGNOSIS — Z7982 Long term (current) use of aspirin: Secondary | ICD-10-CM

## 2016-06-10 DIAGNOSIS — Z7902 Long term (current) use of antithrombotics/antiplatelets: Secondary | ICD-10-CM

## 2016-06-10 DIAGNOSIS — I2510 Atherosclerotic heart disease of native coronary artery without angina pectoris: Secondary | ICD-10-CM | POA: Diagnosis present

## 2016-06-10 DIAGNOSIS — Z794 Long term (current) use of insulin: Secondary | ICD-10-CM

## 2016-06-10 DIAGNOSIS — T148XXA Other injury of unspecified body region, initial encounter: Secondary | ICD-10-CM | POA: Diagnosis not present

## 2016-06-10 DIAGNOSIS — E1121 Type 2 diabetes mellitus with diabetic nephropathy: Secondary | ICD-10-CM | POA: Diagnosis present

## 2016-06-10 DIAGNOSIS — Z79899 Other long term (current) drug therapy: Secondary | ICD-10-CM | POA: Diagnosis not present

## 2016-06-10 DIAGNOSIS — N183 Chronic kidney disease, stage 3 (moderate): Secondary | ICD-10-CM | POA: Diagnosis present

## 2016-06-10 DIAGNOSIS — E871 Hypo-osmolality and hyponatremia: Secondary | ICD-10-CM | POA: Diagnosis present

## 2016-06-10 DIAGNOSIS — S88112A Complete traumatic amputation at level between knee and ankle, left lower leg, initial encounter: Secondary | ICD-10-CM

## 2016-06-10 DIAGNOSIS — N400 Enlarged prostate without lower urinary tract symptoms: Secondary | ICD-10-CM | POA: Diagnosis present

## 2016-06-10 DIAGNOSIS — G546 Phantom limb syndrome with pain: Secondary | ICD-10-CM

## 2016-06-10 DIAGNOSIS — L089 Local infection of the skin and subcutaneous tissue, unspecified: Secondary | ICD-10-CM | POA: Diagnosis not present

## 2016-06-10 DIAGNOSIS — I5032 Chronic diastolic (congestive) heart failure: Secondary | ICD-10-CM

## 2016-06-10 DIAGNOSIS — I96 Gangrene, not elsewhere classified: Secondary | ICD-10-CM

## 2016-06-10 DIAGNOSIS — I998 Other disorder of circulatory system: Secondary | ICD-10-CM | POA: Diagnosis present

## 2016-06-10 DIAGNOSIS — D649 Anemia, unspecified: Secondary | ICD-10-CM | POA: Insufficient documentation

## 2016-06-10 DIAGNOSIS — B1920 Unspecified viral hepatitis C without hepatic coma: Secondary | ICD-10-CM | POA: Diagnosis present

## 2016-06-10 DIAGNOSIS — Z981 Arthrodesis status: Secondary | ICD-10-CM | POA: Diagnosis not present

## 2016-06-10 DIAGNOSIS — E785 Hyperlipidemia, unspecified: Secondary | ICD-10-CM | POA: Diagnosis present

## 2016-06-10 DIAGNOSIS — J45909 Unspecified asthma, uncomplicated: Secondary | ICD-10-CM | POA: Diagnosis present

## 2016-06-10 DIAGNOSIS — Z8673 Personal history of transient ischemic attack (TIA), and cerebral infarction without residual deficits: Secondary | ICD-10-CM

## 2016-06-10 DIAGNOSIS — M79671 Pain in right foot: Secondary | ICD-10-CM

## 2016-06-10 DIAGNOSIS — IMO0002 Reserved for concepts with insufficient information to code with codable children: Secondary | ICD-10-CM | POA: Diagnosis present

## 2016-06-10 DIAGNOSIS — T402X5A Adverse effect of other opioids, initial encounter: Secondary | ICD-10-CM | POA: Diagnosis present

## 2016-06-10 DIAGNOSIS — Z87891 Personal history of nicotine dependence: Secondary | ICD-10-CM | POA: Insufficient documentation

## 2016-06-10 DIAGNOSIS — E1122 Type 2 diabetes mellitus with diabetic chronic kidney disease: Secondary | ICD-10-CM | POA: Diagnosis not present

## 2016-06-10 DIAGNOSIS — E1165 Type 2 diabetes mellitus with hyperglycemia: Secondary | ICD-10-CM

## 2016-06-10 DIAGNOSIS — Z89512 Acquired absence of left leg below knee: Secondary | ICD-10-CM

## 2016-06-10 DIAGNOSIS — E1143 Type 2 diabetes mellitus with diabetic autonomic (poly)neuropathy: Secondary | ICD-10-CM

## 2016-06-10 DIAGNOSIS — R5082 Postprocedural fever: Secondary | ICD-10-CM | POA: Diagnosis not present

## 2016-06-10 DIAGNOSIS — R52 Pain, unspecified: Secondary | ICD-10-CM

## 2016-06-10 DIAGNOSIS — I10 Essential (primary) hypertension: Secondary | ICD-10-CM | POA: Diagnosis not present

## 2016-06-10 DIAGNOSIS — D638 Anemia in other chronic diseases classified elsewhere: Secondary | ICD-10-CM | POA: Diagnosis present

## 2016-06-10 DIAGNOSIS — E1152 Type 2 diabetes mellitus with diabetic peripheral angiopathy with gangrene: Secondary | ICD-10-CM | POA: Diagnosis present

## 2016-06-10 DIAGNOSIS — I129 Hypertensive chronic kidney disease with stage 1 through stage 4 chronic kidney disease, or unspecified chronic kidney disease: Secondary | ICD-10-CM | POA: Insufficient documentation

## 2016-06-10 DIAGNOSIS — I999 Unspecified disorder of circulatory system: Secondary | ICD-10-CM

## 2016-06-10 DIAGNOSIS — E1169 Type 2 diabetes mellitus with other specified complication: Secondary | ICD-10-CM | POA: Diagnosis not present

## 2016-06-10 DIAGNOSIS — K5903 Drug induced constipation: Secondary | ICD-10-CM | POA: Diagnosis present

## 2016-06-10 DIAGNOSIS — I130 Hypertensive heart and chronic kidney disease with heart failure and stage 1 through stage 4 chronic kidney disease, or unspecified chronic kidney disease: Secondary | ICD-10-CM | POA: Diagnosis present

## 2016-06-10 DIAGNOSIS — Z9889 Other specified postprocedural states: Secondary | ICD-10-CM | POA: Insufficient documentation

## 2016-06-10 DIAGNOSIS — D72829 Elevated white blood cell count, unspecified: Secondary | ICD-10-CM | POA: Diagnosis present

## 2016-06-10 DIAGNOSIS — R5381 Other malaise: Secondary | ICD-10-CM | POA: Diagnosis not present

## 2016-06-10 DIAGNOSIS — M109 Gout, unspecified: Secondary | ICD-10-CM | POA: Diagnosis present

## 2016-06-10 DIAGNOSIS — D509 Iron deficiency anemia, unspecified: Secondary | ICD-10-CM | POA: Diagnosis present

## 2016-06-10 HISTORY — DX: Other disorder of circulatory system: I99.8

## 2016-06-10 HISTORY — DX: Gangrene, not elsewhere classified: I96

## 2016-06-10 LAB — COMPREHENSIVE METABOLIC PANEL
ALK PHOS: 72 U/L (ref 38–126)
ALT: 28 U/L (ref 17–63)
ANION GAP: 9 (ref 5–15)
AST: 26 U/L (ref 15–41)
Albumin: 3 g/dL — ABNORMAL LOW (ref 3.5–5.0)
BUN: 6 mg/dL (ref 6–20)
CALCIUM: 9.2 mg/dL (ref 8.9–10.3)
CHLORIDE: 102 mmol/L (ref 101–111)
CO2: 23 mmol/L (ref 22–32)
Creatinine, Ser: 0.75 mg/dL (ref 0.61–1.24)
Glucose, Bld: 254 mg/dL — ABNORMAL HIGH (ref 65–99)
Potassium: 4.4 mmol/L (ref 3.5–5.1)
SODIUM: 134 mmol/L — AB (ref 135–145)
Total Bilirubin: 0.1 mg/dL — ABNORMAL LOW (ref 0.3–1.2)
Total Protein: 8.6 g/dL — ABNORMAL HIGH (ref 6.5–8.1)

## 2016-06-10 LAB — CBC WITH DIFFERENTIAL/PLATELET
BASOS ABS: 0 10*3/uL (ref 0.0–0.1)
Basophils Relative: 0 %
EOS ABS: 0.1 10*3/uL (ref 0.0–0.7)
Eosinophils Relative: 2 %
HCT: 26.8 % — ABNORMAL LOW (ref 39.0–52.0)
HEMOGLOBIN: 8.1 g/dL — AB (ref 13.0–17.0)
LYMPHS ABS: 2.1 10*3/uL (ref 0.7–4.0)
Lymphocytes Relative: 22 %
MCH: 24.7 pg — AB (ref 26.0–34.0)
MCHC: 30.2 g/dL (ref 30.0–36.0)
MCV: 81.7 fL (ref 78.0–100.0)
Monocytes Absolute: 0.4 10*3/uL (ref 0.1–1.0)
Monocytes Relative: 4 %
NEUTROS PCT: 72 %
Neutro Abs: 6.7 10*3/uL (ref 1.7–7.7)
PLATELETS: 500 10*3/uL — AB (ref 150–400)
RBC: 3.28 MIL/uL — AB (ref 4.22–5.81)
RDW: 16.8 % — ABNORMAL HIGH (ref 11.5–15.5)
WBC: 9.3 10*3/uL (ref 4.0–10.5)

## 2016-06-10 LAB — GLUCOSE, CAPILLARY: GLUCOSE-CAPILLARY: 228 mg/dL — AB (ref 65–99)

## 2016-06-10 LAB — I-STAT CG4 LACTIC ACID, ED: LACTIC ACID, VENOUS: 1.32 mmol/L (ref 0.5–1.9)

## 2016-06-10 LAB — PROTIME-INR
INR: 1.12
PROTHROMBIN TIME: 14.5 s (ref 11.4–15.2)

## 2016-06-10 MED ORDER — INSULIN ASPART 100 UNIT/ML ~~LOC~~ SOLN
0.0000 [IU] | Freq: Every day | SUBCUTANEOUS | Status: DC
  Administered 2016-06-10: 2 [IU] via SUBCUTANEOUS
  Administered 2016-06-14: 5 [IU] via SUBCUTANEOUS
  Administered 2016-06-15: 2 [IU] via SUBCUTANEOUS

## 2016-06-10 MED ORDER — INSULIN ASPART 100 UNIT/ML ~~LOC~~ SOLN
0.0000 [IU] | Freq: Three times a day (TID) | SUBCUTANEOUS | Status: DC
  Administered 2016-06-11: 4 [IU] via SUBCUTANEOUS
  Administered 2016-06-11: 7 [IU] via SUBCUTANEOUS
  Administered 2016-06-11: 3 [IU] via SUBCUTANEOUS
  Administered 2016-06-12: 4 [IU] via SUBCUTANEOUS
  Administered 2016-06-14: 11 [IU] via SUBCUTANEOUS
  Administered 2016-06-14 – 2016-06-15 (×3): 4 [IU] via SUBCUTANEOUS
  Administered 2016-06-17: 7 [IU] via SUBCUTANEOUS

## 2016-06-10 MED ORDER — ASPIRIN 325 MG PO TABS
325.0000 mg | ORAL_TABLET | Freq: Every day | ORAL | Status: DC
  Administered 2016-06-11 – 2016-06-17 (×6): 325 mg via ORAL
  Filled 2016-06-10 (×6): qty 1

## 2016-06-10 MED ORDER — CLOPIDOGREL BISULFATE 75 MG PO TABS
75.0000 mg | ORAL_TABLET | Freq: Every day | ORAL | Status: DC
  Administered 2016-06-11 – 2016-06-17 (×6): 75 mg via ORAL
  Filled 2016-06-10 (×6): qty 1

## 2016-06-10 MED ORDER — CARVEDILOL 3.125 MG PO TABS
3.1250 mg | ORAL_TABLET | Freq: Two times a day (BID) | ORAL | Status: DC
  Administered 2016-06-10 – 2016-06-17 (×14): 3.125 mg via ORAL
  Filled 2016-06-10 (×14): qty 1

## 2016-06-10 MED ORDER — ACETAMINOPHEN 500 MG PO TABS
1000.0000 mg | ORAL_TABLET | Freq: Four times a day (QID) | ORAL | Status: DC | PRN
  Administered 2016-06-12 – 2016-06-15 (×2): 1000 mg via ORAL
  Filled 2016-06-10 (×3): qty 2

## 2016-06-10 MED ORDER — HEPARIN SODIUM (PORCINE) 5000 UNIT/ML IJ SOLN
5000.0000 [IU] | Freq: Three times a day (TID) | INTRAMUSCULAR | Status: DC
  Administered 2016-06-10 – 2016-06-12 (×6): 5000 [IU] via SUBCUTANEOUS
  Filled 2016-06-10 (×7): qty 1

## 2016-06-10 MED ORDER — GABAPENTIN 600 MG PO TABS
1200.0000 mg | ORAL_TABLET | Freq: Every day | ORAL | Status: DC
  Administered 2016-06-10 – 2016-06-16 (×7): 1200 mg via ORAL
  Filled 2016-06-10 (×6): qty 2

## 2016-06-10 MED ORDER — PIPERACILLIN-TAZOBACTAM 3.375 G IVPB
3.3750 g | Freq: Three times a day (TID) | INTRAVENOUS | Status: DC
  Administered 2016-06-10 – 2016-06-17 (×19): 3.375 g via INTRAVENOUS
  Filled 2016-06-10 (×21): qty 50

## 2016-06-10 MED ORDER — TAMSULOSIN HCL 0.4 MG PO CAPS
0.4000 mg | ORAL_CAPSULE | Freq: Every day | ORAL | Status: DC
  Administered 2016-06-10 – 2016-06-17 (×7): 0.4 mg via ORAL
  Filled 2016-06-10 (×7): qty 1

## 2016-06-10 MED ORDER — ACETAMINOPHEN 650 MG RE SUPP: 650.0000 mg | Freq: Four times a day (QID) | RECTAL | Status: DC | PRN

## 2016-06-10 MED ORDER — FENTANYL CITRATE (PF) 100 MCG/2ML IJ SOLN
50.0000 ug | INTRAMUSCULAR | Status: DC | PRN
  Administered 2016-06-10 – 2016-06-11 (×2): 100 ug via INTRAVENOUS
  Filled 2016-06-10 (×2): qty 2

## 2016-06-10 MED ORDER — VANCOMYCIN HCL IN DEXTROSE 1-5 GM/200ML-% IV SOLN
1000.0000 mg | Freq: Two times a day (BID) | INTRAVENOUS | Status: DC
  Administered 2016-06-10 – 2016-06-17 (×14): 1000 mg via INTRAVENOUS
  Filled 2016-06-10 (×15): qty 200

## 2016-06-10 MED ORDER — ONDANSETRON HCL 4 MG PO TABS: 4.0000 mg | ORAL_TABLET | Freq: Four times a day (QID) | ORAL | Status: DC | PRN

## 2016-06-10 MED ORDER — GABAPENTIN 600 MG PO TABS
600.0000 mg | ORAL_TABLET | ORAL | Status: DC
  Administered 2016-06-11 – 2016-06-17 (×12): 600 mg via ORAL
  Filled 2016-06-10 (×10): qty 1

## 2016-06-10 MED ORDER — LISINOPRIL 5 MG PO TABS
5.0000 mg | ORAL_TABLET | Freq: Every day | ORAL | Status: DC
  Administered 2016-06-11 – 2016-06-17 (×6): 5 mg via ORAL
  Filled 2016-06-10 (×6): qty 1

## 2016-06-10 MED ORDER — METHOCARBAMOL 500 MG PO TABS
500.0000 mg | ORAL_TABLET | Freq: Four times a day (QID) | ORAL | Status: DC | PRN
  Administered 2016-06-10 – 2016-06-17 (×8): 500 mg via ORAL
  Filled 2016-06-10 (×8): qty 1

## 2016-06-10 MED ORDER — SODIUM CHLORIDE 0.9 % IV BOLUS (SEPSIS)
1000.0000 mL | Freq: Once | INTRAVENOUS | Status: AC
  Administered 2016-06-10: 1000 mL via INTRAVENOUS

## 2016-06-10 MED ORDER — ONDANSETRON HCL 4 MG/2ML IJ SOLN: 4.0000 mg | Freq: Four times a day (QID) | INTRAMUSCULAR | Status: DC | PRN

## 2016-06-10 MED ORDER — MORPHINE SULFATE (PF) 4 MG/ML IV SOLN
4.0000 mg | Freq: Once | INTRAVENOUS | Status: AC
  Administered 2016-06-10: 4 mg via INTRAVENOUS
  Filled 2016-06-10: qty 1

## 2016-06-10 MED ORDER — PIPERACILLIN-TAZOBACTAM 3.375 G IVPB 30 MIN
3.3750 g | Freq: Once | INTRAVENOUS | Status: AC
  Administered 2016-06-10: 3.375 g via INTRAVENOUS
  Filled 2016-06-10: qty 50

## 2016-06-10 MED ORDER — FERROUS SULFATE 325 (65 FE) MG PO TABS
325.0000 mg | ORAL_TABLET | Freq: Every day | ORAL | Status: DC
  Administered 2016-06-11 – 2016-06-17 (×6): 325 mg via ORAL
  Filled 2016-06-10 (×6): qty 1

## 2016-06-10 MED ORDER — SODIUM CHLORIDE 0.9 % IV SOLN
INTRAVENOUS | Status: DC
  Administered 2016-06-10 – 2016-06-16 (×4): via INTRAVENOUS

## 2016-06-10 MED ORDER — OXYCODONE HCL 5 MG PO TABS
5.0000 mg | ORAL_TABLET | ORAL | Status: DC | PRN
  Administered 2016-06-10 – 2016-06-11 (×4): 5 mg via ORAL
  Filled 2016-06-10 (×4): qty 1

## 2016-06-10 MED ORDER — ONDANSETRON HCL 4 MG/2ML IJ SOLN
4.0000 mg | Freq: Once | INTRAMUSCULAR | Status: AC
  Administered 2016-06-10: 4 mg via INTRAVENOUS
  Filled 2016-06-10: qty 2

## 2016-06-10 MED ORDER — SIMVASTATIN 20 MG PO TABS
20.0000 mg | ORAL_TABLET | Freq: Every day | ORAL | Status: DC
  Administered 2016-06-11 – 2016-06-17 (×6): 20 mg via ORAL
  Filled 2016-06-10 (×6): qty 1

## 2016-06-10 MED ORDER — INSULIN GLARGINE 100 UNIT/ML ~~LOC~~ SOLN
55.0000 [IU] | Freq: Every day | SUBCUTANEOUS | Status: DC
  Administered 2016-06-10 – 2016-06-15 (×6): 55 [IU] via SUBCUTANEOUS
  Filled 2016-06-10 (×6): qty 0.55

## 2016-06-10 MED ORDER — TORSEMIDE 20 MG PO TABS
20.0000 mg | ORAL_TABLET | Freq: Every day | ORAL | Status: DC
  Administered 2016-06-11 – 2016-06-17 (×6): 20 mg via ORAL
  Filled 2016-06-10 (×6): qty 1

## 2016-06-10 MED ORDER — ALLOPURINOL 100 MG PO TABS
200.0000 mg | ORAL_TABLET | Freq: Every day | ORAL | Status: DC
  Administered 2016-06-10 – 2016-06-16 (×7): 200 mg via ORAL
  Filled 2016-06-10 (×7): qty 2

## 2016-06-10 NOTE — H&P (Signed)
History and Physical    Cory Weber JGO:[id number] DOB: 03/31/1950 DOA: 06/10/2016  PCP: Maggie Font, MD Patient coming from: pain clinic  Chief Complaint: R foot pain  HPI: Cory Weber is a 66 y.o. male with medical history significant of asthma, BPH, CAD, CVA, hepatitis C, hyperlipidemia, hypertension, peripheral vascular disease, diabetes, left BKA. Of note patient was discharged from the hospital  on 05/31/2016 after treatment for right ischemic foot secondary to severe PVD. During admission patient underwent arthrectomy of the right SFA and had a drug-coated balloon angioplasty with stent placement. Patient was started on Plavix and aspirin at that time. Patient states his right foot has been in severe pain since time of his previous admission with no improvement. In fact patient states his pain is constant and getting worse. Pain is worse with attempted ambulation. Home oral narcotics are not relieving his pain. Patient states she's had 3 days of discharge from the foot and that it started turning black approximately 2-3 days ago. Denies fevers but has had occasional body aches during this time. Denies chest pain, shortness breath, palpitations, abdominal pain, dysuria, frequency, flank pain, neck stiffness, LOC, headache, dizziness/vertigo, focal neurological deficits.  ED Course: Vascular consultation. Dr. early has seen patient in consultation and agrees with aggressive IV antibiotics at this time.  Review of Systems: As per HPI otherwise all other systems reviewed and are negative   Ambulatory Status: wheelchair bound due to L BKA  Past Medical History:  Diagnosis Date  . Anemia   . Asthma   . BPH (benign prostatic hyperplasia)   . Cellulitis and abscess of foot 07/2015  . Chronic kidney disease    CKD stage 3  . CVA (cerebral vascular accident) ([place]) 2015   denies residual on 08/15/2015  . Hepatitis C    states he was diagnosed in 2007 or 2007 while living in  [place], [place]  . Hyperlipidemia   . Hypertension   . Peripheral neuropathy   . Pneumonia X 1  . PVD (peripheral vascular disease) ([place])   . Type II diabetes mellitus ([place])     Past Surgical History:  Procedure Laterality Date  . ABDOMINAL AORTOGRAM N/A 06/03/2016   Procedure: Abdominal Aortogram;  Surgeon: Waynetta Sandy, MD;  Location: [facility];  Service: Cardiovascular;  Laterality: N/A;  . AMPUTATION Left 08/16/2015   Procedure: LEFT BELOW THE KNEE AMPUTATION ;  Surgeon: Serafina Mitchell, MD;  Location: [facility];  Service: Vascular;  Laterality: Left;  . BACK SURGERY    . BELOW KNEE LEG AMPUTATION Left 08/16/2015  . BIOPSY N/A 09/03/2012   Procedure: BIOPSY;  Surgeon: Daneil Dolin, MD;  Location: [facility];  Service: Endoscopy;  Laterality: N/A;  gastric and gastric mucosa  . BIOPSY N/A 12/03/2012   Procedure: BIOPSY;  Surgeon: Daneil Dolin, MD;  Location: [facility];  Service: Endoscopy;  Laterality: N/A;  . COLONOSCOPY WITH PROPOFOL N/A 09/03/2012   TDH:RCBULAG polyp-removed as outlined above. Prominent internal hemorrhoids. Tubular adenoma  . ESOPHAGOGASTRODUODENOSCOPY (EGD) WITH PROPOFOL N/A 09/03/2012   TXM:IWOEHO hernia. Gastric diverticulum. Gastric ulcers with associated erosions. Duodenal erosions. Status post gastric biopsy. H.PYLORI gastritis   . ESOPHAGOGASTRODUODENOSCOPY (EGD) WITH PROPOFOL N/A 12/03/2012   Dr. Gala Romney: gastric diverticulum, gastric erosions and scar. Previously noted gastric ulcer completed healed. Biopsy without H.pylori.   . ESOPHAGOGASTRODUODENOSCOPY (EGD) WITH PROPOFOL N/A 01/23/2016   Procedure: ESOPHAGOGASTRODUODENOSCOPY (EGD) WITH PROPOFOL;  Surgeon: Doran Stabler, MD;  Location: [facility];  Service: Gastroenterology;  Laterality: N/A;  . LIVER BIOPSY  2005   Done in [place], [place]. Chronic hepatitis with mild periportal inflammation, lobular unicellular necrosis and portal fibrosis. Grade 2, stage 1-2.  . LOWER EXTREMITY  ANGIOGRAPHY Right 06/03/2016   Procedure: Lower Extremity Angiography;  Surgeon: Waynetta Sandy, MD;  Location: [facility];  Service: Cardiovascular;  Laterality: Right;  . MAXIMUM ACCESS (MAS)POSTERIOR LUMBAR INTERBODY FUSION (PLIF) 1 LEVEL N/A 11/25/2013   Procedure: FOR MAXIMUM ACCESS (MAS) POSTERIOR LUMBAR INTERBODY FUSION (PLIF) 1 LEVEL;  Surgeon: Eustace Moore, MD;  Location: [facility];  Service: Neurosurgery;  Laterality: N/A;  FOR MAXIMUM ACCESS (MAS) POSTERIOR LUMBAR INTERBODY FUSION (PLIF) 1 LEVEL LUMBAR 3-4  . PERIPHERAL VASCULAR ATHERECTOMY Right 06/03/2016   Procedure: Peripheral Vascular Atherectomy;  Surgeon: Waynetta Sandy, MD;  Location: [facility];  Service: Cardiovascular;  Laterality: Right;  SFA WITH STENT  . PERIPHERAL VASCULAR CATHETERIZATION Left 08/01/2015   Procedure: Lower Extremity Angiography;  Surgeon: Serafina Mitchell, MD;  Location: [facility];  Service: Cardiovascular;  Laterality: Left;  . PERIPHERAL VASCULAR CATHETERIZATION N/A 08/01/2015   Procedure: Abdominal Aortogram;  Surgeon: Serafina Mitchell, MD;  Location: [facility];  Service: Cardiovascular;  Laterality: N/A;  . PERIPHERAL VASCULAR CATHETERIZATION N/A 08/08/2015   Procedure: Abdominal Aortogram w/Lower Extremity;  Surgeon: Serafina Mitchell, MD;  Location: [facility];  Service: Cardiovascular;  Laterality: N/A;  . PERIPHERAL VASCULAR CATHETERIZATION Left 08/08/2015   Procedure: Peripheral Vascular Balloon Angioplasty;  Surgeon: Serafina Mitchell, MD;  Location: [facility];  Service: Cardiovascular;  Laterality: Left;  left popiteal artery, left peronealtrunk, left post tibial  . POLYPECTOMY N/A 09/03/2012   Procedure: POLYPECTOMY;  Surgeon: Daneil Dolin, MD;  Location: [facility];  Service: Endoscopy;  Laterality: N/A;  cecal polyp  . TONSILLECTOMY      Social History   Social History  . Marital status: Divorced    Spouse name: N/A  . Number  of children: 1  . Years of education: N/A   Occupational History  . trying to get disability    Social History Main Topics  . Smoking status: Former Smoker    Packs/day: 1.00    Years: 47.00    Types: Cigarettes    Quit date: 07/13/2015  . Smokeless tobacco: Never Used  . Alcohol use No     Comment:  none 08/15/2015 "stopped drinking 3-4 years ago"  . Drug use: No     Comment: 08/15/2015 "last used cocaine 3-4 months ago"  . Sexual activity: No   Other Topics Concern  . Not on file   Social History Narrative  . No narrative on file    No Known Allergies  Family History  Problem Relation Age of Onset  . Breast cancer Mother   . Heart failure Mother   . Diabetes Father   . Hypertension Father   . Heart disease Father   . Hyperlipidemia Father   . Diabetes Sister   . Hypertension Sister   . Breast cancer Sister   . Colon cancer Neg Hx   . Liver disease Neg Hx     Prior to Admission medications   Medication Sig Start Date End Date Taking? Authorizing Provider  allopurinol (ZYLOPRIM) 100 MG tablet Give 2 tablet by mouth every evening    Historical Provider, MD  aspirin 325 MG tablet Take 325 mg by mouth daily.    Historical Provider,  MD  carvedilol (COREG) 3.125 MG tablet Take 1 tablet (3.125 mg total) by mouth 2 (two) times daily with a meal. 08/10/15   Maryann Mikhail, DO  clopidogrel (PLAVIX) 75 MG tablet Take 1 tablet (75 mg total) by mouth daily with breakfast. 06/05/16   Nishant Dhungel, MD  ferrous sulfate 325 (65 FE) MG tablet Take 1 tablet (325 mg total) by mouth 3 (three) times daily with meals. Patient taking differently: Take 325 mg by mouth daily.  11/27/15   Hosie Poisson, MD  gabapentin (NEURONTIN) 600 MG tablet Take 600-1,200 mg by mouth 3 (three) times daily. Take one tablet twice daily and take 2 tablets at bedtime    Historical Provider, MD  HYDROcodone-acetaminophen (NORCO/VICODIN) 5-325 MG tablet Take 2 tablets by mouth every 4 (four) hours as needed for  moderate pain. Patient not taking: Reported on 06/10/2016 06/04/16   Nishant Dhungel, MD  insulin aspart (NOVOLOG) 100 UNIT/ML injection Inject 22 Units into the skin 3 (three) times daily before meals.     Historical Provider, MD  insulin glargine (LANTUS) 100 UNIT/ML injection Inject 55 Units into the skin at bedtime.     Historical Provider, MD  lisinopril (PRINIVIL,ZESTRIL) 5 MG tablet Take 5 mg by mouth daily.     Historical Provider, MD  magnesium hydroxide (MILK OF MAGNESIA) 400 MG/5ML suspension Take 30 mLs by mouth daily as needed for mild constipation.    Historical Provider, MD  metFORMIN (GLUCOPHAGE) 500 MG tablet Take 500 mg by mouth daily with breakfast.    Historical Provider, MD  methocarbamol (ROBAXIN) 500 MG tablet Take 500 mg by mouth every 6 (six) hours.    Historical Provider, MD  polyethylene glycol (MIRALAX / GLYCOLAX) packet Take 17 g by mouth daily. 06/05/16   Nishant Dhungel, MD  simvastatin (ZOCOR) 20 MG tablet Take 20 mg by mouth every evening.     Historical Provider, MD  tamsulosin (FLOMAX) 0.4 MG CAPS capsule Take 1 capsule (0.4 mg total) by mouth daily. 08/10/15   Maryann Mikhail, DO  torsemide (DEMADEX) 20 MG tablet Take 20 mg by mouth 2 (two) times daily.     Historical Provider, MD    Physical Exam: Vitals:   06/10/16 1500 06/10/16 1515 06/10/16 1530 06/10/16 1600  BP: (!) 146/95 127/79 134/84 (!) 141/83  Pulse: 88 86 84 81  Resp: 15 17 16 17   Temp:      TempSrc:      SpO2: 98% 100% 100% 99%  Weight:      Height:         General:  Appears calm and comfortable Eyes:  PERRL, EOMI, normal lids, iris ENT:  grossly normal hearing, lips & tongue, mmm Neck:  no LAD, masses or thyromegaly Cardiovascular: II/VI systolic murmur RRR,, RLE 1+ edema to mid calf Respiratory:  CTA bilaterally, no w/r/r. Normal respiratory effort. Abdomen:  soft, ntnd, NABS Skin:  Right foot with darkened digits with purulent discharge. Erythema of RLE to mid calf region that is ttp.   Musculoskeletal: L BKA noted. grossly normal tone BUE, no bony abnormality of remaining LE and BUE Psychiatric:  grossly normal mood and affect, speech fluent and appropriate, AOx3 Neurologic:  CN 2-12 grossly intact, moves all extremities in coordinated fashion, sensation intact  Labs on Admission: I have personally reviewed following labs and imaging studies  CBC:  Recent Labs Lab 06/04/16 0227 06/10/16 1316  WBC 11.0* 9.3  NEUTROABS  --  6.7  HGB 8.2* 8.1*  HCT 27.7* 26.8*  MCV 82.0 81.7  PLT 384 409*   Basic Metabolic Panel:  Recent Labs Lab 06/04/16 0227 06/10/16 1316  NA 133* 134*  K 5.1 4.4  CL 101 102  CO2 26 23  GLUCOSE 186* 254*  BUN 13 6  CREATININE 0.94 0.75  CALCIUM 9.1 9.2   GFR: Estimated Creatinine Clearance: 106.8 mL/min (by C-G formula based on SCr of 0.75 mg/dL). Liver Function Tests:  Recent Labs Lab 06/10/16 1316  AST 26  ALT 28  ALKPHOS 72  BILITOT 0.1*  PROT 8.6*  ALBUMIN 3.0*   No results for input(s): LIPASE, AMYLASE in the last 168 hours. No results for input(s): AMMONIA in the last 168 hours. Coagulation Profile:  Recent Labs Lab 06/10/16 1316  INR 1.12   Cardiac Enzymes: No results for input(s): CKTOTAL, CKMB, CKMBINDEX, TROPONINI in the last 168 hours. BNP (last 3 results) No results for input(s): PROBNP in the last 8760 hours. HbA1C: No results for input(s): HGBA1C in the last 72 hours. CBG:  Recent Labs Lab 06/03/16 2140 06/04/16 0701 06/04/16 1138  GLUCAP 257* 207* 157*   Lipid Profile: No results for input(s): CHOL, HDL, LDLCALC, TRIG, CHOLHDL, LDLDIRECT in the last 72 hours. Thyroid Function Tests: No results for input(s): TSH, T4TOTAL, FREET4, T3FREE, THYROIDAB in the last 72 hours. Anemia Panel: No results for input(s): VITAMINB12, FOLATE, FERRITIN, TIBC, IRON, RETICCTPCT in the last 72 hours. Urine analysis:    Component Value Date/Time   COLORURINE YELLOW 06/01/2016 1144   APPEARANCEUR HAZY (A)  06/01/2016 1144   LABSPEC 1.016 06/01/2016 1144   PHURINE 5.0 06/01/2016 1144   GLUCOSEU 50 (A) 06/01/2016 1144   HGBUR NEGATIVE 06/01/2016 1144   BILIRUBINUR NEGATIVE 06/01/2016 1144   KETONESUR NEGATIVE 06/01/2016 1144   PROTEINUR NEGATIVE 06/01/2016 1144   UROBILINOGEN 1.0 11/29/2013 1705   NITRITE NEGATIVE 06/01/2016 1144   LEUKOCYTESUR NEGATIVE 06/01/2016 1144    Creatinine Clearance: Estimated Creatinine Clearance: 106.8 mL/min (by C-G formula based on SCr of 0.75 mg/dL).  Sepsis Labs: @LABRCNTIP (procalcitonin:4,lacticidven:4) ) Recent Results (from the past 240 hour(s))  Culture, blood (Routine X 2) w Reflex to ID Panel     Status: None   Collection Time: 05/31/16 11:25 PM  Result Value Ref Range Status   Specimen Description BLOOD BLOOD RIGHT ARM  Final   Special Requests   Final    BOTTLES DRAWN AEROBIC AND ANAEROBIC Blood Culture adequate volume   Culture NO GROWTH 5 DAYS  Final   Report Status 06/06/2016 FINAL  Final  Culture, blood (Routine X 2) w Reflex to ID Panel     Status: None   Collection Time: 05/31/16 11:30 PM  Result Value Ref Range Status   Specimen Description BLOOD LEFT ANTECUBITAL  Final   Special Requests IN PEDIATRIC BOTTLE Blood Culture adequate volume  Final   Culture NO GROWTH 5 DAYS  Final   Report Status 06/06/2016 FINAL  Final  MRSA PCR Screening     Status: None   Collection Time: 06/01/16 12:25 AM  Result Value Ref Range Status   MRSA by PCR NEGATIVE NEGATIVE Final    Comment:        The GeneXpert MRSA Assay (FDA approved for NASAL specimens only), is one component of a comprehensive MRSA colonization surveillance program. It is not intended to diagnose MRSA infection nor to guide or monitor treatment for MRSA infections.   Culture, Urine     Status: Abnormal   Collection Time: 06/01/16 11:45 AM  Result Value Ref  Range Status   Specimen Description URINE, CLEAN CATCH  Final   Special Requests NONE  Final   Culture <10,000  COLONIES/mL INSIGNIFICANT GROWTH (A)  Final   Report Status 06/02/2016 FINAL  Final     Radiological Exams on Admission: Dg Foot Complete Right  Result Date: 06/10/2016 CLINICAL DATA:  Lateral soft tissue wound draining purulent material. EXAM: RIGHT FOOT COMPLETE - 3+ VIEW COMPARISON:  05/31/2016 FINDINGS: No acute fracture or dislocation is noted no underlying bony destruction is seen. No gross soft tissue abnormality is noted. IMPRESSION: No acute abnormality seen. Electronically Signed   By: Inez Catalina M.D.   On: 06/10/2016 15:38     Assessment/Plan Active Problems:   Essential hypertension   BPH (benign prostatic hyperplasia)   Amputation of left lower extremity below knee ([place])   Uncontrolled type II diabetes with peripheral autonomic neuropathy ([place])   Dyslipidemia associated with type 2 diabetes mellitus ([facility])   Ischemia of foot   Physical deconditioning   Wet gangrene ([facility])   Anemia, chronic disease   Intractable pain   Chronic diastolic CHF (congestive heart failure) ([facility])   PVD / R foot wet gangrene: likely from recent ischemic events and mechanical trauma sustained piror to last admission. Pt w/ recent right arthrectomy of the SFA and drug-coated balloon angioplasty with stent placement. Performed by Vascular and Dr Early aware of pt and following. Pt at risk of losing toes or foot, fortunately the foot appears to be perfusing since recent procedure. DG foot w/o acute process noted. WBC 9.3, lactic acid 1.3, afebrile and vital signs are stable. - continue Vanc/Zosyn - f/u BCX - further mgt per vascular - Continue Plavix, ASA - Continue statin  Intractable pain: due to ischemic and now infectious process of R foot. Will need IV pain medications and possibly a PCA if not tolerating - Continue Neurontin,  - Tylenol and PRN Oxycodone - Fentanyl 50-100 prn  HTN: - continue lisinopril, coreg  Anemia: 8.1 on admission. Baseline 9-10. Likely from chronic disease and iron  deficiency. Normocytic. No reported GI loss.  - CBC in am - continue home iron  Gout: - continue allopurinol  Chronic diastolic congestive heart failure: Last echo showing an EF of 50% and grade 2 diastolic dysfunction. We'll compensated. - Continue torsemide  BPH: - continue flomax  LBKA: Pt endorses progressive difficulty w/ ambulation and hopes to go to a SNF or other rehab facilitty due to difficulty performing ADLs.  - PT/OT/CSW for placement.   DM:  - SSI - Continue lantus   DVT prophylaxis: hep  Code Status: full  Family Communication: sister  Disposition Plan: pending improvement in foot infection vs amputation  Consults called: Dr. Donnetta Hutching of vascular  Admission status: inpt    Nile Dorning J MD [facility]  If 7PM-7AM, please contact night-coverage [email or web address] Password [email or web address]  06/10/2016, 5:08 PM

## 2016-06-10 NOTE — Progress Notes (Addendum)
Subjective:    Patient ID: Cory Weber, male    DOB: 1950-11-24, 66 y.o.   MRN: [id number]  Visit with Dr. Alger Simons, MD  HPI Mrs. Schollmeyer is here in follow up of his left BKA. He was discharged from [facility] where he was admitted for ischemia in his right foot. He underwent a arthrectomy of the right SFA and a drug coated balloon angioplasty of the right SFA with stent by Dr. Donzetta Matters. He was discharged to home on 06/04/16. He tells me he hasn't received any HH nursing or follow up of any type. He is awaiting "a new appointment" with Dr. Donzetta Matters. I checked his AVS and his appointment is currently scheduled in June.   Mr. Donzetta Matters says that since being home that his right foot/leg have become increasingly painful. He has noticed drainage from the foot as well. He is concerned and feels that he needs "someone to help care for the leg. " He presented today wearing a sandal on the right foot which was loosely secured.   He was sent home on hydrocodone for pain but ran out of the medication last week.   Pain Inventory Average Pain 9 Pain Right Now 7 My pain is sharp, stabbing and tingling  In the last 24 hours, has pain interfered with the following? General activity 7 Relation with others 8 Enjoyment of life 8 What TIME of day is your pain at its worst? evening Sleep (in general) Fair  Pain is worse with: sitting Pain improves with: medication Relief from Meds: 8  Mobility ability to climb steps?  no do you drive?  no use a wheelchair needs help with transfers  Function disabled: date disabled . I need assistance with the following:  dressing, bathing, toileting, meal prep, household duties and shopping  Neuro/Psych bowel control problems spasms depression  Prior Studies Any changes since last visit?  no  Physicians involved in your care Any changes since last visit?  no   Family History  Problem Relation Age of Onset  . Breast cancer Mother   . Heart failure  Mother   . Diabetes Father   . Hypertension Father   . Heart disease Father   . Hyperlipidemia Father   . Diabetes Sister   . Hypertension Sister   . Breast cancer Sister   . Colon cancer Neg Hx   . Liver disease Neg Hx    Social History   Social History  . Marital status: Divorced    Spouse name: N/A  . Number of children: 1  . Years of education: N/A   Occupational History  . trying to get disability    Social History Main Topics  . Smoking status: Former Smoker    Packs/day: 1.00    Years: 47.00    Types: Cigarettes    Quit date: 07/13/2015  . Smokeless tobacco: Never Used  . Alcohol use No     Comment:  none 08/15/2015 "stopped drinking 3-4 years ago"  . Drug use: No     Comment: 08/15/2015 "last used cocaine 3-4 months ago"  . Sexual activity: No   Other Topics Concern  . Not on file   Social History Narrative  . No narrative on file   Past Surgical History:  Procedure Laterality Date  . ABDOMINAL AORTOGRAM N/A 06/03/2016   Procedure: Abdominal Aortogram;  Surgeon: Waynetta Sandy, MD;  Location: [place] [facility];  Service: Cardiovascular;  Laterality: N/A;  . AMPUTATION Left 08/16/2015  Procedure: LEFT BELOW THE KNEE AMPUTATION ;  Surgeon: Serafina Mitchell, MD;  Location: [place];  Service: Vascular;  Laterality: Left;  . BACK SURGERY    . BELOW KNEE LEG AMPUTATION Left 08/16/2015  . BIOPSY N/A 09/03/2012   Procedure: BIOPSY;  Surgeon: Daneil Dolin, MD;  Location: [facility];  Service: Endoscopy;  Laterality: N/A;  gastric and gastric mucosa  . BIOPSY N/A 12/03/2012   Procedure: BIOPSY;  Surgeon: Daneil Dolin, MD;  Location: [facility];  Service: Endoscopy;  Laterality: N/A;  . COLONOSCOPY WITH PROPOFOL N/A 09/03/2012   OBS:JGGEZMO polyp-removed as outlined above. Prominent internal hemorrhoids. Tubular adenoma  . ESOPHAGOGASTRODUODENOSCOPY (EGD) WITH PROPOFOL N/A 09/03/2012   QHU:TMLYYT hernia. Gastric diverticulum. Gastric ulcers with associated erosions.  Duodenal erosions. Status post gastric biopsy. H.PYLORI gastritis   . ESOPHAGOGASTRODUODENOSCOPY (EGD) WITH PROPOFOL N/A 12/03/2012   Dr. Gala Romney: gastric diverticulum, gastric erosions and scar. Previously noted gastric ulcer completed healed. Biopsy without H.pylori.   . ESOPHAGOGASTRODUODENOSCOPY (EGD) WITH PROPOFOL N/A 01/23/2016   Procedure: ESOPHAGOGASTRODUODENOSCOPY (EGD) WITH PROPOFOL;  Surgeon: Doran Stabler, MD;  Location: [facility];  Service: Gastroenterology;  Laterality: N/A;  . LIVER BIOPSY  2005   Done in [place], [place]. Chronic hepatitis with mild periportal inflammation, lobular unicellular necrosis and portal fibrosis. Grade 2, stage 1-2.  . LOWER EXTREMITY ANGIOGRAPHY Right 06/03/2016   Procedure: Lower Extremity Angiography;  Surgeon: Waynetta Sandy, MD;  Location: [place] [facility];  Service: Cardiovascular;  Laterality: Right;  . MAXIMUM ACCESS (MAS)POSTERIOR LUMBAR INTERBODY FUSION (PLIF) 1 LEVEL N/A 11/25/2013   Procedure: FOR MAXIMUM ACCESS (MAS) POSTERIOR LUMBAR INTERBODY FUSION (PLIF) 1 LEVEL;  Surgeon: Eustace Moore, MD;  Location: [place] [facility];  Service: Neurosurgery;  Laterality: N/A;  FOR MAXIMUM ACCESS (MAS) POSTERIOR LUMBAR INTERBODY FUSION (PLIF) 1 LEVEL LUMBAR 3-4  . PERIPHERAL VASCULAR ATHERECTOMY Right 06/03/2016   Procedure: Peripheral Vascular Atherectomy;  Surgeon: Waynetta Sandy, MD;  Location: [place] [facility];  Service: Cardiovascular;  Laterality: Right;  SFA WITH STENT  . PERIPHERAL VASCULAR CATHETERIZATION Left 08/01/2015   Procedure: Lower Extremity Angiography;  Surgeon: Serafina Mitchell, MD;  Location: [place] [facility];  Service: Cardiovascular;  Laterality: Left;  . PERIPHERAL VASCULAR CATHETERIZATION N/A 08/01/2015   Procedure: Abdominal Aortogram;  Surgeon: Serafina Mitchell, MD;  Location: [place] [facility];  Service: Cardiovascular;  Laterality: N/A;  . PERIPHERAL VASCULAR CATHETERIZATION N/A 08/08/2015   Procedure:  Abdominal Aortogram w/Lower Extremity;  Surgeon: Serafina Mitchell, MD;  Location: [place] [facility];  Service: Cardiovascular;  Laterality: N/A;  . PERIPHERAL VASCULAR CATHETERIZATION Left 08/08/2015   Procedure: Peripheral Vascular Balloon Angioplasty;  Surgeon: Serafina Mitchell, MD;  Location: [place] [facility];  Service: Cardiovascular;  Laterality: Left;  left popiteal artery, left peronealtrunk, left post tibial  . POLYPECTOMY N/A 09/03/2012   Procedure: POLYPECTOMY;  Surgeon: Daneil Dolin, MD;  [facility];  Service: Endoscopy;  Laterality: N/A;  cecal polyp  . TONSILLECTOMY     Past Medical History:  Diagnosis Date  . Anemia   . Asthma   . BPH (benign prostatic hyperplasia)   . Cellulitis and abscess of foot 07/2015  . Chronic kidney disease    CKD stage 3  . CVA (cerebral vascular accident) ([place]) 2015   denies residual on 08/15/2015  . Hepatitis C    states he was diagnosed in 2007 or 2007 while living in [place], [place]  . Hyperlipidemia   .  Hypertension   . Peripheral neuropathy   . Pneumonia X 1  . PVD (peripheral vascular disease) ([place])   . Type II diabetes mellitus ([place])    There were no vitals taken for this visit.  Opioid Risk Score:   Fall Risk Score:  `1  Depression screen PHQ 2/9  Depression screen PHQ 2/9 02/22/2015  Decreased Interest 0  Down, Depressed, Hopeless 0  PHQ - 2 Score 0  Some recent data might be hidden    Review of Systems  Constitutional: Positive for appetite change.  HENT: Negative.   Eyes: Negative.   Respiratory: Negative.   Cardiovascular: Negative.   Gastrointestinal: Negative.   Endocrine: Negative.   Genitourinary: Negative.   Musculoskeletal: Negative.   Skin: Negative.   Allergic/Immunologic: Negative.   Neurological: Negative.   Hematological: Negative.   Psychiatric/Behavioral: Negative.   All other systems reviewed and are negative.      Objective:   Physical Exam  Constitutional: He appears well-developed  and well-nourished.  HENT: Normocephalic and atraumatic.  Cardiovascular: RRR Respiratory: CTA B GI: Soft. Bowel sounds are normal. He exhibits no distension.  Musculoskeletal: He exhibits Mild edema, no tenderness RLE. Stump well-shaped and formed.   Neurological: He is alert and oriented.  Motor: B/l UE, RLE: 5/5 proximal to distal LLE: hip flexion 5/5 KE 4/5.  Skin: Skin is warm. Right foot notable for gangrenous toes which are black and now weeping/cracking with odor/drainage. Area very tender as well.      Left below knee Amputation site well healed and formed.  Psychiatric: He has a normal mood and affect. His behavior is normal.   Assessment & Plan:  [name] with history of HTN, T2DM, tobacco abuse, hepatitis C, CKD,PVD,  S/p left BKA.   1. S/p left BKA 08/16/2015 after recent left popliteal angioplasty. -left leg looks great and is ready for prosthetic fitting  2. Pain Management with phantom limb pain -continue with  Neurontin to 600 TID  -hydrocodone/narcotics per surgery at this point 3. Diabetes mellitus with peripheral neuropathy. -per primary   4. Ischemia right lower ext s/p angio/stent. All five toes are gangrenous and are now weeping/infected. Pt with severe pain in the right foot also.  -I contacted [facility] and spoke with Dr. Claretha Cooper nurse  -I sent pt to the ED for evaluation given his clinical presentation       Follow up with me 3 months at which time we might discuss prosthetic options depending upon course of RLE. 25 minutes of face to face patient care time were spent during this visit. All questions were encouraged and answered.  Meredith Staggers, MD, [facility] 06/10/2016

## 2016-06-10 NOTE — ED Triage Notes (Signed)
Last week and stent placed in right leg for circulatory problems. Seen pain management Doctor today sent to the ED for evaluation of patient right foot due to drainage between 3,4,and 5th toes yellow red and has fould odor with pain 9/10 achy.

## 2016-06-10 NOTE — Patient Instructions (Signed)
PLEASE FEEL FREE TO CALL OUR OFFICE WITH ANY PROBLEMS OR QUESTIONS ([phone])      

## 2016-06-10 NOTE — Progress Notes (Signed)
Pharmacy Antibiotic Note  Cory Weber is a 66 y.o. male admitted on 06/10/2016 with cellulitis.  Pharmacy has been consulted for vancomycin and zosyn dosing. Pt is afebrile and WBC is WNL. SCr and lactic acid are also WNL. Recent balloon angioplasty of SFA.   Plan: Vancomycin 1gm IV Q12H Zosyn 3.375gm IV Q8H (4 hr inf) F/u renal fxn, C&S, clinical status and trough at SS  Height: 6\' 1"  (185.4 cm) Weight: 190 lb (86.2 kg) IBW/kg (Calculated) : 79.9  Temp (24hrs), Avg:98.4 F (36.9 C), Min:98.4 F (36.9 C), Max:98.4 F (36.9 C)   Recent Labs Lab 06/04/16 0227 06/10/16 1316 06/10/16 1344  WBC 11.0* 9.3  --   CREATININE 0.94 0.75  --   LATICACIDVEN  --   --  1.32    Estimated Creatinine Clearance: 106.8 mL/min (by C-G formula based on SCr of 0.75 mg/dL).    No Known Allergies  Antimicrobials this admission: vanc 4/30>> Zosyn 4/30>>  Dose adjustments this admission: N/A  Microbiology results: Pending  Thank you for allowing pharmacy to be a part of this patient's care.  Tifini Reeder, Rande Lawman 06/10/2016 3:50 PM

## 2016-06-10 NOTE — ED Provider Notes (Signed)
[facility] DEPT Provider Note   CSN: [id number] Arrival date & time: 06/10/16  1250     History   Chief Complaint Chief Complaint  Patient presents with  . Foot Pain  . Wound Infection    HPI Cory Weber is a 66 y.o. male.  HPI 66 year old Afro-American male past medical history significant for CK D, CVA, hepatitis C, hypertension, PVD, diabetes, left BKA that presents to the ED today with complaints of purulent drainage wound to the right foot and toes. The patient was recently admitted to the hospital who had a stent placed in the right SFA for right leg ischemia. At that time patient did have discoloration to the toes of the right foot. Patient was being seen by rehabilitation and pain management today who is concerned for healthy wound looked consent to the ED for evaluations. Patient notes purulent drainage between the third fourth and fifth toes of the right foot with foul smelling odor in significant pain that he rates a 9 out of 10 described as aching in the right foot. Patient denies any fever, nausea, vomiting. Does have sensation of the right foot. Patient is able to bear minimal weight due to the pain. He has been trying home narcotic medication as prescribed by surgery last week with little relief. Patient states that the "blackness" of his toes has slightly improved but the drainage and redness is new. Past Medical History:  Diagnosis Date  . Anemia   . Asthma   . BPH (benign prostatic hyperplasia)   . Cellulitis and abscess of foot 07/2015  . Chronic kidney disease    CKD stage 3  . CVA (cerebral vascular accident) ([place]) 2015   denies residual on 08/15/2015  . Hepatitis C    states he was diagnosed in 2007 or 2007 while living in [place], [place]  . Hyperlipidemia   . Hypertension   . Peripheral neuropathy   . Pneumonia X 1  . PVD (peripheral vascular disease) ([place])   . Type II diabetes mellitus [facility])     Patient Active Problem List   Diagnosis Date  Noted  . Ischemic pain of right foot 06/04/2016  . Ischemia of foot 05/31/2016  . 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 Dyslipidemia associated with type 2 diabetes mellitus ([place]) 01/21/2016  . Uncontrolled type II diabetes with peripheral autonomic neuropathy ([place]) 01/17/2016  . Weight gain 12/20/2015  . Edema of right lower extremity 12/20/2015  . Cough 11/26/2015  . GERD (gastroesophageal reflux disease) 10/08/2015  . Amputation of left lower extremity below knee ([place]) 08/21/2015  . Phantom limb pain ([place])   . Anemia of chronic disease   . BPH (benign prostatic hyperplasia)   . ETOH abuse   . PVD (peripheral vascular disease) ([place])   . Chronic hepatitis C without hepatic coma ([place])   . History of CVA (cerebrovascular accident) without residual deficits   . Atherosclerosis of left lower extremity with ulceration of midfoot ([place])   . Chronic kidney disease, stage 3 07/18/2015  . Essential hypertension 07/18/2015  . S/P lumbar spinal fusion 11/25/2013  . Melena 12/03/2011  . Hemiparesthesia 02/03/2011  . Polysubstance abuse 02/03/2011  . Tobacco abuse 02/03/2011    Past Surgical History:  Procedure Laterality Date  . ABDOMINAL AORTOGRAM N/A 06/03/2016   Procedure: Abdominal Aortogram;  Surgeon: Waynetta Sandy, MD;  Location: [facility];  Service: Cardiovascular;  Laterality: N/A;  . AMPUTATION Left 08/16/2015  Procedure: LEFT BELOW THE KNEE AMPUTATION ;  Surgeon: Serafina Mitchell, MD;  Location: [facility];  Service: Vascular;  Laterality: Left;  . BACK SURGERY    . BELOW KNEE LEG AMPUTATION Left 08/16/2015  . BIOPSY N/A 09/03/2012   Procedure: BIOPSY;  Surgeon: Daneil Dolin, MD;  Location: [facility];  Service: Endoscopy;  Laterality: N/A;  gastric and gastric mucosa  . BIOPSY N/A 12/03/2012   Procedure: BIOPSY;  Surgeon: Daneil Dolin, MD;  Location: [facility];  Service: Endoscopy;  Laterality: N/A;  . COLONOSCOPY  WITH PROPOFOL N/A 09/03/2012   OFB:PZWCHEN polyp-removed as outlined above. Prominent internal hemorrhoids. Tubular adenoma  . ESOPHAGOGASTRODUODENOSCOPY (EGD) WITH PROPOFOL N/A 09/03/2012   IDP:OEUMPN hernia. Gastric diverticulum. Gastric ulcers with associated erosions. Duodenal erosions. Status post gastric biopsy. H.PYLORI gastritis   . ESOPHAGOGASTRODUODENOSCOPY (EGD) WITH PROPOFOL N/A 12/03/2012   Dr. Gala Romney: gastric diverticulum, gastric erosions and scar. Previously noted gastric ulcer completed healed. Biopsy without H.pylori.   . ESOPHAGOGASTRODUODENOSCOPY (EGD) WITH PROPOFOL N/A 01/23/2016   Procedure: ESOPHAGOGASTRODUODENOSCOPY (EGD) WITH PROPOFOL;  Surgeon: Doran Stabler, MD;  Location: [facility];  Service: Gastroenterology;  Laterality: N/A;  . LIVER BIOPSY  2005   Done in [place], [place]. Chronic hepatitis with mild periportal inflammation, lobular unicellular necrosis and portal fibrosis. Grade 2, stage 1-2.  . LOWER EXTREMITY ANGIOGRAPHY Right 06/03/2016   Procedure: Lower Extremity Angiography;  Surgeon: Waynetta Sandy, MD;  Location: [facility];  Service: Cardiovascular;  Laterality: Right;  . MAXIMUM ACCESS (MAS)POSTERIOR LUMBAR INTERBODY FUSION (PLIF) 1 LEVEL N/A 11/25/2013   Procedure: FOR MAXIMUM ACCESS (MAS) POSTERIOR LUMBAR INTERBODY FUSION (PLIF) 1 LEVEL;  Surgeon: Eustace Moore, MD;  Location: [facility];  Service: Neurosurgery;  Laterality: N/A;  FOR MAXIMUM ACCESS (MAS) POSTERIOR LUMBAR INTERBODY FUSION (PLIF) 1 LEVEL LUMBAR 3-4  . PERIPHERAL VASCULAR ATHERECTOMY Right 06/03/2016   Procedure: Peripheral Vascular Atherectomy;  Surgeon: Waynetta Sandy, MD;  Location: [facility];  Service: Cardiovascular;  Laterality: Right;  SFA WITH STENT  . PERIPHERAL VASCULAR CATHETERIZATION Left 08/01/2015   Procedure: Lower Extremity Angiography;  Surgeon: Serafina Mitchell, MD;  Location: [facility];  Service: Cardiovascular;  Laterality:  Left;  . PERIPHERAL VASCULAR CATHETERIZATION N/A 08/01/2015   Procedure: Abdominal Aortogram;  Surgeon: Serafina Mitchell, MD;  Location: [facility];  Service: Cardiovascular;  Laterality: N/A;  . PERIPHERAL VASCULAR CATHETERIZATION N/A 08/08/2015   Procedure: Abdominal Aortogram w/Lower Extremity;  Surgeon: Serafina Mitchell, MD;  Location: [facility];  Service: Cardiovascular;  Laterality: N/A;  . PERIPHERAL VASCULAR CATHETERIZATION Left 08/08/2015   Procedure: Peripheral Vascular Balloon Angioplasty;  Surgeon: Serafina Mitchell, MD;  Location: [facility];  Service: Cardiovascular;  Laterality: Left;  left popiteal artery, left peronealtrunk, left post tibial  . POLYPECTOMY N/A 09/03/2012   Procedure: POLYPECTOMY;  Surgeon: Daneil Dolin, MD;  Location: [facility];  Service: Endoscopy;  Laterality: N/A;  cecal polyp  . TONSILLECTOMY         Home Medications    Prior to Admission medications   Medication Sig Start Date End Date Taking? Authorizing Provider  allopurinol (ZYLOPRIM) 100 MG tablet Give 2 tablet by mouth every evening    Historical Provider, MD  aspirin 325 MG tablet Take 325 mg by mouth daily.    Historical Provider, MD  carvedilol (COREG) 3.125 MG tablet Take 1 tablet (3.125 mg total) by mouth 2 (two) times daily  with a meal. 08/10/15   Maryann Mikhail, DO  clopidogrel (PLAVIX) 75 MG tablet Take 1 tablet (75 mg total) by mouth daily with breakfast. 06/05/16   Nishant Dhungel, MD  ferrous sulfate 325 (65 FE) MG tablet Take 1 tablet (325 mg total) by mouth 3 (three) times daily with meals. Patient taking differently: Take 325 mg by mouth daily.  11/27/15   Hosie Poisson, MD  gabapentin (NEURONTIN) 600 MG tablet Take 600-1,200 mg by mouth 3 (three) times daily. Take one tablet twice daily and take 2 tablets at bedtime    Historical Provider, MD  HYDROcodone-acetaminophen (NORCO/VICODIN) 5-325 MG tablet Take 2 tablets by mouth every 4 (four) hours as needed for moderate  pain. 06/04/16   Nishant Dhungel, MD  insulin aspart (NOVOLOG) 100 UNIT/ML injection Inject 22 Units into the skin 3 (three) times daily before meals.     Historical Provider, MD  insulin glargine (LANTUS) 100 UNIT/ML injection Inject 55 Units into the skin at bedtime.     Historical Provider, MD  lisinopril (PRINIVIL,ZESTRIL) 5 MG tablet Take 5 mg by mouth daily.     Historical Provider, MD  magnesium hydroxide (MILK OF MAGNESIA) 400 MG/5ML suspension Take 30 mLs by mouth daily as needed for mild constipation.    Historical Provider, MD  metFORMIN (GLUCOPHAGE) 500 MG tablet Take 500 mg by mouth daily with breakfast.    Historical Provider, MD  methocarbamol (ROBAXIN) 500 MG tablet Take 500 mg by mouth every 6 (six) hours.    Historical Provider, MD  polyethylene glycol (MIRALAX / GLYCOLAX) packet Take 17 g by mouth daily. 06/05/16   Nishant Dhungel, MD  simvastatin (ZOCOR) 20 MG tablet Take 20 mg by mouth every evening.     Historical Provider, MD  tamsulosin (FLOMAX) 0.4 MG CAPS capsule Take 1 capsule (0.4 mg total) by mouth daily. 08/10/15   Maryann Mikhail, DO  torsemide (DEMADEX) 20 MG tablet Take 20 mg by mouth 2 (two) times daily.     Historical Provider, MD    Family History Family History  Problem Relation Age of Onset  . Breast cancer Mother   . Heart failure Mother   . Diabetes Father   . Hypertension Father   . Heart disease Father   . Hyperlipidemia Father   . Diabetes Sister   . Hypertension Sister   . Breast cancer Sister   . Colon cancer Neg Hx   . Liver disease Neg Hx     Social History Social History  Substance Use Topics  . Smoking status: Former Smoker    Packs/day: 1.00    Years: 47.00    Types: Cigarettes    Quit date: 07/13/2015  . Smokeless tobacco: Never Used  . Alcohol use No     Comment:  none 08/15/2015 "stopped drinking 3-4 years ago"     Allergies   Patient has no known allergies.   Review of Systems Review of Systems  Constitutional: Negative  for chills and fever.  HENT: Negative for congestion.   Eyes: Negative for visual disturbance.  Respiratory: Negative for cough and shortness of breath.   Cardiovascular: Negative for chest pain.  Gastrointestinal: Negative for abdominal pain, diarrhea, nausea and vomiting.  Genitourinary: Negative for dysuria, frequency and urgency.  Musculoskeletal: Positive for myalgias.  Skin: Positive for wound.  Neurological: Negative for dizziness, syncope, weakness, light-headedness, numbness and headaches.     Physical Exam Updated Vital Signs BP 134/84   Pulse 84   Temp 98.4 F (36.9  C) (Oral)   Resp 16   Ht 6\' 1"  (1.854 m)   Wt 86.2 kg   SpO2 100%   BMI 25.07 kg/m   Physical Exam  Constitutional: He is oriented to person, place, and time. He appears well-developed and well-nourished. No distress.  Non toxic appearing  HENT:  Head: Normocephalic and atraumatic.  Mouth/Throat: Oropharynx is clear and moist.  Eyes: Conjunctivae are normal. Right eye exhibits no discharge. Left eye exhibits no discharge. No scleral icterus.  Neck: Normal range of motion. Neck supple. No thyromegaly present.  Cardiovascular: Normal rate, regular rhythm, normal heart sounds and intact distal pulses.   Pulmonary/Chest: Effort normal and breath sounds normal.  Abdominal: Soft. Bowel sounds are normal. He exhibits no distension. There is no tenderness.  Musculoskeletal: Normal range of motion.  BKA of left leg.  Duskiness of right 1-3 digits of toe. Tenderness to palpation. Purulent drainage noted between the second third and fourth digits with macerated tissue noted. Full range of motion. Sensation intact to sharp dull. Cap refill 3-4 seconds.    faint 1+ DP pulse noted to the right foot on Doppler.   Lymphadenopathy:    He has no cervical adenopathy.  Neurological: He is alert and oriented to person, place, and time.  Skin: Skin is warm and dry. Capillary refill takes 2 to 3 seconds.  Nursing note  and vitals reviewed.        ED Treatments / Results  Labs (all labs ordered are listed, but only abnormal results are displayed) Labs Reviewed  COMPREHENSIVE METABOLIC PANEL - Abnormal; Notable for the following:       Result Value   Sodium 134 (*)    Glucose, Bld 254 (*)    Total Protein 8.6 (*)    Albumin 3.0 (*)    Total Bilirubin 0.1 (*)    All other components within normal limits  CBC WITH DIFFERENTIAL/PLATELET - Abnormal; Notable for the following:    RBC 3.28 (*)    Hemoglobin 8.1 (*)    HCT 26.8 (*)    MCH 24.7 (*)    RDW 16.8 (*)    Platelets 500 (*)    All other components within normal limits  CULTURE, BLOOD (ROUTINE X 2)  CULTURE, BLOOD (ROUTINE X 2)  PROTIME-INR  URINALYSIS, ROUTINE W REFLEX MICROSCOPIC  I-STAT CG4 LACTIC ACID, ED  I-STAT CG4 LACTIC ACID, ED    EKG  EKG Interpretation None       Radiology Dg Foot Complete Right  Result Date: 06/10/2016 CLINICAL DATA:  Lateral soft tissue wound draining purulent material. EXAM: RIGHT FOOT COMPLETE - 3+ VIEW COMPARISON:  05/31/2016 FINDINGS: No acute fracture or dislocation is noted no underlying bony destruction is seen. No gross soft tissue abnormality is noted. IMPRESSION: No acute abnormality seen. Electronically Signed   By: Inez Catalina M.D.   On: 06/10/2016 15:38    Procedures Procedures (including critical care time)  Medications Ordered in ED Medications  ondansetron (ZOFRAN) injection 4 mg (4 mg Intravenous Given 06/10/16 1445)  morphine 4 MG/ML injection 4 mg (4 mg Intravenous Given 06/10/16 1447)  sodium chloride 0.9 % bolus 1,000 mL (1,000 mLs Intravenous New Bag/Given 06/10/16 1443)     Initial Impression / Assessment and Plan / ED Course  I have reviewed the triage vital signs and the nursing notes.  Pertinent labs & imaging results that were available during my care of the patient were reviewed by me and considered in my medical decision making (  see chart for details).      Patient resents to the ED with concern for infection to the right foot. Patient recently had a stent placed in his right SFA by vascular surgery last week. Was being seen by pain management rehabilitation this morning he noticed purulent drainage from his right toes and sent to the ED for evaluation. On my exam patient has signs and symptoms concerning for wet gangrene. Patient with noted purulent drainage from toes on the right foot. Faint pulse was dopplerable the right foot. Significant pain noted. Patient has intact sharp/dull. Poor cap refill. Patient is afebrile. No tachycardia or hypotension. Lactic is normal. No leukocytosis noted. Doubt sepsis. Hemoglobin is 8.1 which is stable from discharge last week. All other labs look at baseline for patient. PT/INR normal. X-ray of the right foot shows no bony involvement concerning for osteomyelitis. Patient was started on broad-spectrum antibiotics with blood cultures obtained prior to initiation of antibiotics. Spoke with Dr. Donnetta Hutching with vascular medicine who agrees to consult on patient in the ED and to admit to medicine for IV antibiotics. Spoke with Dr.  Marily Memos with hospital medicine who agrees to come to the ED to evaluate patient in place admission orders. Patient up-to-date on plan of care. He is currently hemodynamically stable in no acute distress. Vital signs remain stable. Patient given pain medicine with improvement in his pain. Pt dicussed wit Dr. Gustavus Messing who is agreeable to the above plan.   Final Clinical Impressions(s) / ED Diagnoses   Final diagnoses:  Wound infection    New Prescriptions New Prescriptions   No medications on file     Doristine Devoid, PA-C 06/10/16 [place], MD 06/10/16 2139

## 2016-06-10 NOTE — Consult Note (Signed)
Patient name: Cory Weber MRN: [id number] DOB: 28-Apr-1950 Sex: male  REASON FOR VISIT: Evaluate right foot  HPI: Cory Weber is a 66 y.o. male recently admitted for gangrenous changes in his right foot. He underwent revascularization by Dr. Donzetta Matters on 06/03/2016. He had atherectomy of the superficial femoral artery and stenting. He was discharged to home to allow for demarcation of his toes. He is presents to the emergency room today with worsening pain and some erythema.  Current Facility-Administered Medications  Medication Dose Route Frequency Provider Last Rate Last Dose  . clopidogrel (PLAVIX) tablet 300 mg  300 mg Oral Once Waynetta Sandy, MD      . piperacillin-tazobactam (ZOSYN) IVPB 3.375 g  3.375 g Intravenous Q8H Rachel L Rumbarger, RPH      . vancomycin (VANCOCIN) IVPB 1000 mg/200 mL premix  1,000 mg Intravenous Q12H Valeda Malm Rumbarger, RPH 200 mL/hr at 06/10/16 1606 1,000 mg at 06/10/16 1606   Current Outpatient Prescriptions  Medication Sig Dispense Refill  . allopurinol (ZYLOPRIM) 100 MG tablet Take 200 mg by mouth daily after supper.     Marland Kitchen aspirin 325 MG tablet Take 325 mg by mouth daily.    . carvedilol (COREG) 3.125 MG tablet Take 1 tablet (3.125 mg total) by mouth 2 (two) times daily with a meal. 60 tablet 0  . clopidogrel (PLAVIX) 75 MG tablet Take 1 tablet (75 mg total) by mouth daily with breakfast. 30 tablet 0  . ferrous sulfate 325 (65 FE) MG tablet Take 1 tablet (325 mg total) by mouth 3 (three) times daily with meals. (Patient taking differently: Take 325 mg by mouth daily. ) 90 tablet 3  . gabapentin (NEURONTIN) 600 MG tablet Take 600-1,200 mg by mouth See admin instructions. Take 1 tablet (600 mg) by mouth twice daily - morning and afternoon and take 2 tablets (1200 mg) at bedtime    . lisinopril (PRINIVIL,ZESTRIL) 5 MG tablet Take 5 mg by mouth daily.     . magnesium hydroxide (MILK OF MAGNESIA) 400 MG/5ML  suspension Take 30 mLs by mouth daily as needed for mild constipation.    . metFORMIN (GLUCOPHAGE) 500 MG tablet Take 500 mg by mouth daily with breakfast.    . methocarbamol (ROBAXIN) 500 MG tablet Take 500 mg by mouth every 6 (six) hours as needed for muscle spasms.     . polyethylene glycol (MIRALAX / GLYCOLAX) packet Take 17 g by mouth daily. (Patient taking differently: Take 17 g by mouth daily as needed. Mix in 8 oz liquid and drink) 14 each 0  . simvastatin (ZOCOR) 20 MG tablet Take 20 mg by mouth daily.     . tamsulosin (FLOMAX) 0.4 MG CAPS capsule Take 1 capsule (0.4 mg total) by mouth daily. 30 capsule 0  . torsemide (DEMADEX) 20 MG tablet Take 20 mg by mouth daily.     Marland Kitchen HYDROcodone-acetaminophen (NORCO/VICODIN) 5-325 MG tablet Take 2 tablets by mouth every 4 (four) hours as needed for moderate pain. (Patient not taking: Reported on 06/10/2016) 30 tablet 0  . insulin aspart (NOVOLOG) 100 UNIT/ML injection Inject 22 Units into the skin 3 (three) times daily before meals.     . insulin glargine (LANTUS) 100 UNIT/ML injection Inject 55 Units into the skin at bedtime.        PHYSICAL EXAM: Vitals:   06/10/16 1500 06/10/16 1515 06/10/16 1530 06/10/16 1600  BP: (!) 146/95 127/79 134/84 (!) 141/83  Pulse: 88 86 84 81  Resp: 15  17 16 17   Temp:      TempSrc:      SpO2: 98% 100% 100% 99%  Weight:      Height:        GENERAL: The patient is a well-nourished male, in no acute distress. The vital signs are documented above. Easily palpable right popliteal pulse. Erythema in his foot but no pus. No evidence of deep tissue infection. Dry gangrene of the toes of his foot.  MEDICAL ISSUES: Agree with admission for pain control and antibiotics. Dr. Donzetta Matters will see him in the morning to discuss timing and the level of toe amputations.   Rosetta Posner, MD FACS [facility] Tel [phone] Pager [phone]

## 2016-06-11 LAB — CBC
HCT: 22.2 % — ABNORMAL LOW (ref 39.0–52.0)
Hemoglobin: 6.6 g/dL — CL (ref 13.0–17.0)
MCH: 24.4 pg — ABNORMAL LOW (ref 26.0–34.0)
MCHC: 29.7 g/dL — AB (ref 30.0–36.0)
MCV: 81.9 fL (ref 78.0–100.0)
Platelets: 475 10*3/uL — ABNORMAL HIGH (ref 150–400)
RBC: 2.71 MIL/uL — ABNORMAL LOW (ref 4.22–5.81)
RDW: 16.7 % — AB (ref 11.5–15.5)
WBC: 9.4 10*3/uL (ref 4.0–10.5)

## 2016-06-11 LAB — BASIC METABOLIC PANEL
Anion gap: 4 — ABNORMAL LOW (ref 5–15)
BUN: 7 mg/dL (ref 6–20)
CALCIUM: 8.6 mg/dL — AB (ref 8.9–10.3)
CHLORIDE: 104 mmol/L (ref 101–111)
CO2: 27 mmol/L (ref 22–32)
CREATININE: 0.89 mg/dL (ref 0.61–1.24)
GFR calc Af Amer: >60 mL/min (ref >60)
GFR calc non Af Amer: >60 mL/min (ref >60)
GLUCOSE: 180 mg/dL — AB (ref 65–99)
Potassium: 3.8 mmol/L (ref 3.5–5.1)
Sodium: 135 mmol/L (ref 135–145)

## 2016-06-11 LAB — GLUCOSE, CAPILLARY
GLUCOSE-CAPILLARY: 181 mg/dL — AB (ref 65–99)
Glucose-Capillary: 145 mg/dL — ABNORMAL HIGH (ref 65–99)
Glucose-Capillary: 205 mg/dL — ABNORMAL HIGH (ref 65–99)
Glucose-Capillary: 132 mg/dL — ABNORMAL HIGH (ref 65–99)

## 2016-06-11 LAB — PREPARE RBC (CROSSMATCH)

## 2016-06-11 MED ORDER — OXYCODONE HCL 5 MG PO TABS
10.0000 mg | ORAL_TABLET | ORAL | Status: DC | PRN
  Administered 2016-06-11 – 2016-06-14 (×10): 10 mg via ORAL
  Filled 2016-06-11 (×10): qty 2

## 2016-06-11 MED ORDER — SODIUM CHLORIDE 0.9 % IV SOLN: Freq: Once | INTRAVENOUS | Status: DC

## 2016-06-11 MED ORDER — SODIUM CHLORIDE 0.9 % IV SOLN
Freq: Once | INTRAVENOUS | Status: AC
  Administered 2016-06-11: 07:00:00 via INTRAVENOUS

## 2016-06-11 NOTE — Evaluation (Signed)
Physical Therapy Evaluation Patient Details Name: Cory Weber MRN: [id number] DOB: 09-Nov-1950 Today's Date: 06/11/2016   History of Present Illness  Cory Weber is a 66 y.o. male with medical history significant of asthma, BPH, CAD, CVA, hepatitis C, hyperlipidemia, hypertension, peripheral vascular disease, diabetes, left BKA. Of note patient was discharged from the hospital  on 05/31/2016 after treatment for right ischemic foot secondary to severe PVD. During admission patient underwent arthrectomy of the right SFA and had a drug-coated balloon angioplasty with stent placement. Patient was started on Plavix and aspirin at that time. Patient states his right foot has been in severe pain since time of his previous admission with no improvement.   Clinical Impression  Pt presents with dependencies in mobility secondary to previous L BKA and currently R foot gangrene and possible toe amputations. Pt has been W/C bound for the last several months. Pt lives alone and unable to care for himself. Pt is currently max assist to transfer to a recliner with mod verbal cues for technique. Pt presents with edema and mild drainage from his R foot and increased foot pain during transfer. Pt would benefit from continued acute skilled PT to maximize mobility and decrease burden of care. Recommend SNF placement for therapy to improve safety and independence with transfers.    Follow Up Recommendations SNF    Equipment Recommendations  None recommended by PT    Recommendations for Other Services       Precautions / Restrictions Precautions Precautions: Fall Restrictions Weight Bearing Restrictions:  (no order written limiting WB at present time.)      Mobility  Bed Mobility Overal bed mobility:  (NT)                Transfers Overall transfer level: Needs assistance Equipment used: None Transfers: Squat Pivot Transfers     Squat pivot transfers: Max assist     General transfer  comment: Squat pivot to R side, cues for technique to increase forward trunk translation to assist with transfer. Pt had some clear drainage on R great toe and edema prior to transfer. Pt reported an increase in foot pain with transfers.  Ambulation/Gait                Stairs            Wheelchair Mobility    Modified Rankin (Stroke Patients Only)       Balance Overall balance assessment: Needs assistance Sitting-balance support: No upper extremity supported Sitting balance-Leahy Scale: Good                                       Pertinent Vitals/Pain Pain Assessment: 0-10 Pain Score: 8  Pain Location: R foot Pain Descriptors / Indicators: Discomfort Pain Intervention(s): Limited activity within patient's tolerance;Monitored during session;Repositioned    Home Living Family/patient expects to be discharged to:: Private residence Living Arrangements: Alone   Type of Home: Apartment Home Access: Ramped entrance     Home Layout: One level Home Equipment: Environmental consultant - 2 wheels;Bedside commode;Wheelchair - manual      Prior Function Level of Independence: Independent with assistive device(s) (w/c level)               Hand Dominance        Extremity/Trunk Assessment   Upper Extremity Assessment Upper Extremity Assessment: Defer to OT evaluation    Lower Extremity Assessment Lower  Extremity Assessment: RLE deficits/detail RLE Deficits / Details: ankle NT due to pain, knee flexion/ext 4/5, hip flexion 4/5 LLE Deficits / Details: L BKA knee/hip WFL       Communication   Communication: No difficulties  Cognition Arousal/Alertness: Awake/alert Behavior During Therapy: WFL for tasks assessed/performed Overall Cognitive Status: Within Functional Limits for tasks assessed                                        General Comments General comments (skin integrity, edema, etc.): R foot and toe edma, mild drainage     Exercises     Assessment/Plan    PT Assessment Patient needs continued PT services  PT Problem List Decreased strength;Decreased activity tolerance;Decreased mobility;Decreased knowledge of use of DME;Pain       PT Treatment Interventions DME instruction;Functional mobility training;Therapeutic activities;Therapeutic exercise;Patient/family education;Wheelchair mobility training    PT Goals (Current goals can be found in the Care Plan section)  Acute Rehab PT Goals Patient Stated Goal: To be more independent and eventually try to walk agaqin. PT Goal Formulation: With patient Time For Goal Achievement: 06/25/16 Potential to Achieve Goals: Good    Frequency Min 3X/week   Barriers to discharge  (lives alone)      Co-evaluation               AM-PAC PT "6 Clicks" Daily Activity  Outcome Measure Difficulty turning over in bed (including adjusting bedclothes, sheets and blankets)?: A Little Difficulty moving from lying on back to sitting on the side of the bed? : A Little Difficulty sitting down on and standing up from a chair with arms (e.g., wheelchair, bedside commode, etc,.)?: Total Help needed moving to and from a bed to chair (including a wheelchair)?: A Lot     6 Click Score: 9    End of Session Equipment Utilized During Treatment: Gait belt Activity Tolerance: Patient tolerated treatment well Patient left: in chair;with call bell/phone within reach Nurse Communication: Mobility status PT Visit Diagnosis: Muscle weakness (generalized) (M62.81);Other abnormalities of gait and mobility (R26.89) Pain - Right/Left: Right Pain - part of body: Ankle and joints of foot    Time: 1040-1110 PT Time Calculation (min) (ACUTE ONLY): 30 min   Charges:   PT Evaluation $PT Eval Moderate Complexity: 1 Procedure PT Treatments $Therapeutic Activity: 8-22 mins   PT G Codes:        Jabre Heo PT  Zipporah Finamore Kerstine 06/11/2016, 11:39 AM

## 2016-06-11 NOTE — Progress Notes (Signed)
  Progress Note    06/11/2016 10:37 AM * No surgery found *  Subjective:  Pain is much improved  Vitals:   06/11/16 0515 06/11/16 0610  BP: 127/70 129/73  Pulse: 95 93  Resp: 18 20  Temp: 98.2 F (36.8 C) 98.4 F (36.9 C)    Physical Exam: Palpable popliteal pulse on right Dry gangrene of toes on right  CBC    Component Value Date/Time   WBC 9.4 06/11/2016 0434   RBC 2.71 (L) 06/11/2016 0434   HGB 6.6 (LL) 06/11/2016 0434   HCT 22.2 (L) 06/11/2016 0434   HCT 26.3 (L) 11/26/2015 1440   HCT 32 10/31/2011 1319   PLT 475 (H) 06/11/2016 0434   MCV 81.9 06/11/2016 0434   MCV 82.8 10/31/2011 1319   MCH 24.4 (L) 06/11/2016 0434   MCHC 29.7 (L) 06/11/2016 0434   RDW 16.7 (H) 06/11/2016 0434   LYMPHSABS 2.1 06/10/2016 1316   MONOABS 0.4 06/10/2016 1316   EOSABS 0.1 06/10/2016 1316   BASOSABS 0.0 06/10/2016 1316    BMET    Component Value Date/Time   NA 135 06/11/2016 0434   NA 134 (A) 12/27/2015   K 3.8 06/11/2016 0434   K 4.0 10/31/2011 1319   CL 104 06/11/2016 0434   CO2 27 06/11/2016 0434   GLUCOSE 180 (H) 06/11/2016 0434   BUN 7 06/11/2016 0434   BUN 16 12/27/2015   CREATININE 0.89 06/11/2016 0434   CREATININE 1.06 12/22/2015 1551   CALCIUM 8.6 (L) 06/11/2016 0434   CALCIUM 9.0 10/31/2011 1319   GFRNONAA >60 06/11/2016 0434   GFRAA >60 06/11/2016 0434    INR    Component Value Date/Time   INR 1.12 06/10/2016 1316     Intake/Output Summary (Last 24 hours) at 06/11/16 1037 Last data filed at 06/10/16 1900  Gross per 24 hour  Intake              150 ml  Output              300 ml  Net             -150 ml     Assessment:  66 y.o. male is s/p stenting of right sfa with gangrene of right toes, no underlying osteo noted  Plan: Patient was offered but does not want toes amputated at this time Betadine paint to toes daily Will get abi and RLE duplex while here   Erlene Quan C. Donzetta Matters, MD [facility] Office:  [phone] Pager: [phone]  06/11/2016 10:37 AM

## 2016-06-11 NOTE — Progress Notes (Signed)
PROGRESS NOTE    RISHAWN Weber  QQI:[id number] DOB: Aug 18, 1950 DOA: 06/10/2016 PCP: Maggie Font, MD    Brief Narrative:  66 y.o. male with medical history significant of asthma, BPH, CAD, CVA, hepatitis C, hyperlipidemia, hypertension, peripheral vascular disease, diabetes, left BKA. Of note patient was discharged from the hospital  on 05/31/2016 after treatment for right ischemic foot secondary to severe PVD. During admission patient underwent arthrectomy of the right SFA and had a drug-coated balloon angioplasty with stent placement. Patient was started on Plavix and aspirin at that time. Patient states his right foot has been in severe pain since time of his previous admission with no improvement  Vascular surgery on board   Assessment & Plan:   PVD / R foot wet gangrene: likely from recent ischemic events and mechanical trauma sustained piror to last admission. Pt w/ recent right arthrectomy of the SFA and drug-coated balloon angioplasty with stent placement. Performed by Vascular and Dr Early aware of pt and following.  - Vascular recommending the following right now: Patient was offered but does not want toes amputated at this time Betadine paint to toes daily Will get abi and RLE duplex while here  Intractable pain: due to ischemic and now infectious process of R foot.  Continue this regimen listed below - Continue Neurontin,  - Tylenol and PRN Oxycodone (will increase dose today) - Fentanyl 50-100 prn  HTN: - continue lisinopril, coreg  Anemia: 8.1 on admission. Baseline 9-10. Likely from chronic disease and iron deficiency. Normocytic. No reported GI loss.  - hgb 6.6 will transfuse - continue home iron - reassess post transfusion  Gout: - continue allopurinol  Chronic diastolic congestive heart failure: Last echo showing an EF of 50% and grade 2 diastolic dysfunction. We'll compensated. - Continue torsemide  BPH: - continue flomax  LBKA: Pt endorses  progressive difficulty w/ ambulation and hopes to go to a SNF or other rehab facility due to difficulty performing ADLs.  - PT/OT/CSW for placement.   DM:  - SSI - Continue lantus   DVT prophylaxis: Heparin Code Status: Full Family Communication: None Disposition Plan: pending final recommendations from specialist   Consultants:   Vascular surgery: Todd Early   Procedures: None   Antimicrobials: Vancomycin, Zosyn   Subjective: Pt has no new complaints. No acute issues overnight. States that the oxy IR is helping but he feels like if it was more the pain would be better controlled.  Objective: Vitals:   06/11/16 1030 06/11/16 1114 06/11/16 1330 06/11/16 1426  BP: (!) 136/114 124/62 122/68 132/66  Pulse: 66 83 72 77  Resp: 16 18 18 18   Temp: 98.3 F (36.8 C) 98 F (36.7 C) 97.6 F (36.4 C) 98.7 F (37.1 C)  TempSrc: Oral Oral Oral Oral  SpO2: 100% 100% 99% 99%  Weight:      Height:        Intake/Output Summary (Last 24 hours) at 06/11/16 1552 Last data filed at 06/11/16 1427  Gross per 24 hour  Intake             1388 ml  Output             1400 ml  Net              -12 ml   Filed Weights   06/10/16 1315  Weight: 86.2 kg (190 lb)    Examination:  General exam: Appears calm and comfortable, in nad. Respiratory system: Clear to auscultation. Respiratory effort  normal. Cardiovascular system: S1 & S2 heard, RRR. No JVD Gastrointestinal system: Abdomen is nondistended, soft and nontender. No organomegaly or masses felt. Normal bowel sounds heard. Central nervous system: Alert and oriented. No focal neurological deficits. Extremities: Dry gangrene of toes on right,  Skin: Dry gangrene of toes on right, otherwise warm and dry Psychiatry: Judgement and insight appear normal. Mood & affect appropriate.   Data Reviewed: I have personally reviewed following labs and imaging studies  CBC:  Recent Labs Lab 06/10/16 1316 06/11/16 0434  WBC 9.3 9.4    NEUTROABS 6.7  --   HGB 8.1* 6.6*  HCT 26.8* 22.2*  MCV 81.7 81.9  PLT 500* 619*   Basic Metabolic Panel:  Recent Labs Lab 06/10/16 1316 06/11/16 0434  NA 134* 135  K 4.4 3.8  CL 102 104  CO2 23 27  GLUCOSE 254* 180*  BUN 6 7  CREATININE 0.75 0.89  CALCIUM 9.2 8.6*   GFR: Estimated Creatinine Clearance: 96 mL/min (by C-G formula based on SCr of 0.89 mg/dL). Liver Function Tests:  Recent Labs Lab 06/10/16 1316  AST 26  ALT 28  ALKPHOS 72  BILITOT 0.1*  PROT 8.6*  ALBUMIN 3.0*   No results for input(s): LIPASE, AMYLASE in the last 168 hours. No results for input(s): AMMONIA in the last 168 hours. Coagulation Profile:  Recent Labs Lab 06/10/16 1316  INR 1.12   Cardiac Enzymes: No results for input(s): CKTOTAL, CKMB, CKMBINDEX, TROPONINI in the last 168 hours. BNP (last 3 results) No results for input(s): PROBNP in the last 8760 hours. HbA1C: No results for input(s): HGBA1C in the last 72 hours. CBG:  Recent Labs Lab 06/10/16 2119 06/11/16 0635  GLUCAP 228* 181*   Lipid Profile: No results for input(s): CHOL, HDL, LDLCALC, TRIG, CHOLHDL, LDLDIRECT in the last 72 hours. Thyroid Function Tests: No results for input(s): TSH, T4TOTAL, FREET4, T3FREE, THYROIDAB in the last 72 hours. Anemia Panel: No results for input(s): VITAMINB12, FOLATE, FERRITIN, TIBC, IRON, RETICCTPCT in the last 72 hours. Sepsis Labs:  Recent Labs Lab 06/10/16 1344  LATICACIDVEN 1.32    Recent Results (from the past 240 hour(s))  Culture, blood (Routine x 2)     Status: None (Preliminary result)   Collection Time: 06/10/16  1:30 PM  Result Value Ref Range Status   Specimen Description BLOOD RIGHT WRIST  Final   Special Requests IN PEDIATRIC BOTTLE Blood Culture adequate volume  Final   Culture NO GROWTH < 24 HOURS  Final   Report Status PENDING  Incomplete  Culture, blood (Routine x 2)     Status: None (Preliminary result)   Collection Time: 06/10/16  2:40 PM  Result  Value Ref Range Status   Specimen Description BLOOD LEFT ANTECUBITAL  Final   Special Requests   Final    BOTTLES DRAWN AEROBIC AND ANAEROBIC Blood Culture adequate volume   Culture NO GROWTH < 24 HOURS  Final   Report Status PENDING  Incomplete     Radiology Studies: Dg Foot Complete Right  Result Date: 06/10/2016 CLINICAL DATA:  Lateral soft tissue wound draining purulent material. EXAM: RIGHT FOOT COMPLETE - 3+ VIEW COMPARISON:  05/31/2016 FINDINGS: No acute fracture or dislocation is noted no underlying bony destruction is seen. No gross soft tissue abnormality is noted. IMPRESSION: No acute abnormality seen. Electronically Signed   By: Inez Catalina M.D.   On: 06/10/2016 15:38    Scheduled Meds: . allopurinol  200 mg Oral QPC supper  . aspirin  325 mg Oral Daily  . carvedilol  3.125 mg Oral BID WC  . clopidogrel  75 mg Oral Q breakfast  . ferrous sulfate  325 mg Oral Daily  . gabapentin  1,200 mg Oral QHS  . gabapentin  600 mg Oral 2 times per day  . heparin  5,000 Units Subcutaneous Q8H  . insulin aspart  0-20 Units Subcutaneous TID WC  . insulin aspart  0-5 Units Subcutaneous QHS  . insulin glargine  55 Units Subcutaneous QHS  . lisinopril  5 mg Oral Daily  . simvastatin  20 mg Oral Daily  . tamsulosin  0.4 mg Oral Daily  . torsemide  20 mg Oral Daily   Continuous Infusions: . sodium chloride 75 mL/hr at 06/10/16 1904  . piperacillin-tazobactam (ZOSYN)  IV 3.375 g (06/11/16 1448)  . vancomycin Stopped (06/11/16 0415)     LOS: 1 day    Time spent:> 35 minutes  Velvet Bathe, MD [facility] Pager [phone]  If 7PM-7AM, please contact night-coverage [email or web address] Password [email or web address] 06/11/2016, 3:52 PM

## 2016-06-12 ENCOUNTER — Inpatient Hospital Stay (HOSPITAL_COMMUNITY): Payer: Medicaid Other

## 2016-06-12 DIAGNOSIS — I739 Peripheral vascular disease, unspecified: Secondary | ICD-10-CM

## 2016-06-12 DIAGNOSIS — I998 Other disorder of circulatory system: Secondary | ICD-10-CM

## 2016-06-12 DIAGNOSIS — D638 Anemia in other chronic diseases classified elsewhere: Secondary | ICD-10-CM

## 2016-06-12 LAB — CBC
HCT: 25.5 % — ABNORMAL LOW (ref 39.0–52.0)
Hemoglobin: 7.9 g/dL — ABNORMAL LOW (ref 13.0–17.0)
MCH: 25.5 pg — ABNORMAL LOW (ref 26.0–34.0)
MCHC: 31 g/dL (ref 30.0–36.0)
MCV: 82.3 fL (ref 78.0–100.0)
PLATELETS: 466 10*3/uL — AB (ref 150–400)
RBC: 3.1 MIL/uL — ABNORMAL LOW (ref 4.22–5.81)
RDW: 16.5 % — AB (ref 11.5–15.5)
WBC: 9.5 10*3/uL (ref 4.0–10.5)

## 2016-06-12 LAB — TYPE AND SCREEN
ABO/RH(D): O POS
Antibody Screen: NEGATIVE
Unit division: 0

## 2016-06-12 LAB — VAS US LOWER EXTREMITY ARTERIAL DUPLEX
RATIBDISTSYS: -74 cm/s
RATIBMIDSYS: -85 cm/s
RATIBPSV: -74 cm/s
RIGHT POST TIB DIST SYS: 65 cm/s
RIGHT POST TIB MID SYS: -125 cm/s
RPOPDPSV: -88 cm/s
RSFMPSV: -68 cm/s
RSFPPSV: 124 cm/s
Right popliteal prox sys PSV: 78 cm/s
Right post tibial sys PSV: -120 cm/s
Right super femoral dist sys PSV: -196 cm/s

## 2016-06-12 LAB — GLUCOSE, CAPILLARY
GLUCOSE-CAPILLARY: 118 mg/dL — AB (ref 65–99)
GLUCOSE-CAPILLARY: 73 mg/dL (ref 65–99)
GLUCOSE-CAPILLARY: 167 mg/dL — AB (ref 65–99)
GLUCOSE-CAPILLARY: 174 mg/dL — AB (ref 65–99)

## 2016-06-12 LAB — BPAM RBC
Blood Product Expiration Date: 201805082359
ISSUE DATE / TIME: 201805011038
Unit Type and Rh: 5100

## 2016-06-12 MED ORDER — HYDROMORPHONE HCL 1 MG/ML IJ SOLN
1.0000 mg | INTRAMUSCULAR | Status: DC | PRN
  Administered 2016-06-12 – 2016-06-14 (×8): 1 mg via INTRAVENOUS
  Filled 2016-06-12 (×9): qty 1

## 2016-06-12 MED ORDER — POLYETHYLENE GLYCOL 3350 17 G PO PACK
17.0000 g | PACK | Freq: Every day | ORAL | Status: DC
  Administered 2016-06-12 – 2016-06-16 (×4): 17 g via ORAL
  Filled 2016-06-12 (×5): qty 1

## 2016-06-12 NOTE — Progress Notes (Signed)
OT Cancellation Note  Patient Details Name: PATRICIA PERALES MRN: [id number] DOB: 12/18/1950   Cancelled Treatment:    Reason Eval/Treat Not Completed: Patient at procedure or test/ unavailable. Will follow up for OT eval as time allows.  Binnie Kand M.S., OTR/L Pager: [phone]  06/12/2016, 11:59 AM

## 2016-06-12 NOTE — Evaluation (Signed)
Occupational Therapy Evaluation Patient Details Name: Cory Weber MRN: [id number] DOB: 05/31/1950 Today's Date: 06/12/2016    History of Present Illness Cory Weber is a 66 y.o. male with medical history significant of asthma, BPH, CAD, CVA, hepatitis C, hyperlipidemia, hypertension, peripheral vascular disease, diabetes, left BKA. Of note patient was discharged from the hospital  on 05/31/2016 after treatment for right ischemic foot secondary to severe PVD. During admission patient underwent arthrectomy of the right SFA and had a drug-coated balloon angioplasty with stent placement. Patient was started on Plavix and aspirin at that time. Patient states his right foot has been in severe pain since time of his previous admission with no improvement.    Clinical Impression   Pt reports he was managing BADL at mod I level PTA and his aide was assisting with IADL. Currently pt able to perform lateral scoot transfer with min assist +2 for safety. He requires min assist for UB ADL and max assist for LB ADL. Recommending SNF for follow up to maximize independence and safety with ADL and functional mobility prior to return home. Pt would benefit from continued skilled OT to address established goals.    Follow Up Recommendations  SNF;Supervision/Assistance - 24 hour    Equipment Recommendations  Other (comment) (TBD at next venue)    Recommendations for Other Services       Precautions / Restrictions Precautions Precautions: Fall Restrictions Weight Bearing Restrictions: No      Mobility Bed Mobility Overal bed mobility: Needs Assistance Bed Mobility: Supine to Sit     Supine to sit: Supervision     General bed mobility comments: pt requests for both rails to be put up and uses UE to pull self into long sitting, then side rail down and pivots to EOB  Transfers Overall transfer level: Needs assistance Equipment used: Rolling walker (2 wheeled);None Transfers: Sit to/from  Stand;Lateral/Scoot Transfers Sit to Stand: +2 physical assistance;Mod assist        Lateral/Scoot Transfers: +2 safety/equipment;Min assist General transfer comment: pt performed sit to stand with RW from elevated surface with +2 mod A and was unable to achieve full upright posture or tolerate for more than a few seconds. Scoot transfer used bed to chair with min A +2 for safety     Balance Overall balance assessment: Needs assistance Sitting-balance support: No upper extremity supported Sitting balance-Leahy Scale: Good Sitting balance - Comments: able to wt shift and reach in sitting with no LOB   Standing balance support: Bilateral upper extremity supported Standing balance-Leahy Scale: Zero Standing balance comment: unable to maintain standing without full support                           ADL either performed or assessed with clinical judgement   ADL Overall ADL's : Needs assistance/impaired Eating/Feeding: Set up;Sitting   Grooming: Set up;Supervision/safety;Sitting   Upper Body Bathing: Minimal assistance;Sitting   Lower Body Bathing: Maximal assistance;Sit to/from stand   Upper Body Dressing : Set up;Supervision/safety;Sitting   Lower Body Dressing: Maximal assistance;Sit to/from stand Lower Body Dressing Details (indicate cue type and reason): Max assist to don sock sitting EOB Toilet Transfer: Minimal assistance;+2 for safety/equipment Toilet Transfer Details (indicate cue type and reason): Lateral scoot transfer. Simulated by EOB>chair transfer         Functional mobility during ADLs: Minimal assistance;+2 for safety/equipment (for lateral scoot transfer only)       Vision  Perception     Praxis      Pertinent Vitals/Pain Pain Assessment: Faces Faces Pain Scale: Hurts even more Pain Location: R foot Pain Descriptors / Indicators: Burning;Aching Pain Intervention(s): Limited activity within patient's tolerance;Monitored during  session;Premedicated before session;Repositioned     Hand Dominance Right   Extremity/Trunk Assessment Upper Extremity Assessment Upper Extremity Assessment: Overall WFL for tasks assessed   Lower Extremity Assessment Lower Extremity Assessment: Defer to PT evaluation       Communication Communication Communication: No difficulties   Cognition Arousal/Alertness: Awake/alert Behavior During Therapy: WFL for tasks assessed/performed Overall Cognitive Status: Within Functional Limits for tasks assessed                                     General Comments       Exercises Exercises: Amputee Amputee Exercises Quad Sets: AROM;Both;10 reps;Seated Knee Flexion: AROM;Left;10 reps;Seated Straight Leg Raises: AROM;Both;5 reps;Seated   Shoulder Instructions      Home Living Family/patient expects to be discharged to:: Skilled nursing facility                                        Prior Functioning/Environment Level of Independence: Independent with assistive device(s)  Gait / Transfers Assistance Needed: essentiall w/c bound. independent with transfers ADL's / Homemaking Assistance Needed: aide assists with IADL; 7days per week for a few hours per day. Pt mod I with IADL            OT Problem List: Decreased strength;Decreased activity tolerance;Impaired balance (sitting and/or standing);Decreased knowledge of use of DME or AE;Decreased knowledge of precautions;Pain      OT Treatment/Interventions: Self-care/ADL training;Therapeutic exercise;Energy conservation;DME and/or AE instruction;Therapeutic activities;Patient/family education;Balance training    OT Goals(Current goals can be found in the care plan section) Acute Rehab OT Goals Patient Stated Goal: To be more independent and eventually try to walk agaqin. OT Goal Formulation: With patient Time For Goal Achievement: 06/26/16 Potential to Achieve Goals: Good ADL Goals Pt Will Perform  Lower Body Bathing: with min assist;sit to/from stand Pt Will Perform Lower Body Dressing: with min assist;sit to/from stand Pt Will Transfer to Toilet: with min assist;stand pivot transfer;bedside commode Pt Will Perform Toileting - Clothing Manipulation and hygiene: sit to/from stand;with min guard assist  OT Frequency: Min 2X/week   Barriers to D/C: Decreased caregiver support  pt lives alone       Co-evaluation PT/OT/SLP Co-Evaluation/Treatment: Yes Reason for Co-Treatment: For patient/therapist safety PT goals addressed during session: Mobility/safety with mobility;Balance;Proper use of DME OT goals addressed during session: ADL's and self-care      AM-PAC PT "6 Clicks" Daily Activity     Outcome Measure Help from another person eating meals?: None Help from another person taking care of personal grooming?: A Little Help from another person toileting, which includes using toliet, bedpan, or urinal?: A Lot Help from another person bathing (including washing, rinsing, drying)?: A Lot Help from another person to put on and taking off regular upper body clothing?: A Little Help from another person to put on and taking off regular lower body clothing?: A Lot 6 Click Score: 16   End of Session Equipment Utilized During Treatment: Gait belt;Rolling walker  Activity Tolerance: Patient limited by pain Patient left: in chair;with call bell/phone within reach  OT Visit Diagnosis: Other  abnormalities of gait and mobility (R26.89);Pain Pain - Right/Left: Right Pain - part of body: Ankle and joints of foot                Time: 4627-0350 OT Time Calculation (min): 20 min Charges:  OT General Charges $OT Visit: 1 Procedure OT Evaluation $OT Eval Moderate Complexity: 1 Procedure G-Codes:     Mico Spark A. Ulice Brilliant, M.S., OTR/L Pager: [place] 06/12/2016, 4:03 PM

## 2016-06-12 NOTE — Progress Notes (Signed)
Progress Note    Cory Weber  IHK:[id number] DOB: 1950-04-24  DOA: 06/10/2016 PCP: Maggie Font, MD    Brief Narrative:   Chief complaint: Follow-up right foot pain  Medical records reviewed and are as summarized below:  Cory Weber is an 66 y.o. male with a PMH of asthma, BPH, CAD, history of CVA, hepatitis C, hyperlipidemia, hypertension, PVD with left BKA, diabetes, and recent hospitalization 05/31/16-06/04/16 for treatment of ischemia of right foot with gangrene, status post arthrectomy and balloon angioplasty of right SFA with stent, discharged on aspirin and Plavix, who was admitted 06/10/16 for severe right foot pain unrelieved by oral opiates.  Assessment/Plan:   Principal Problem:   Wet gangrene (HCC)/Ischemia of foot Evaluated by vascular surgery. Will need toes amputated, but at present, the patient declines. Betadine paint to toes daily. ABIs/right lower extremity duplex ordered by vascular surgery. Results pending. On empiric Zosyn/vancomycin. Blood cultures negative to date.  Active Problems:   Essential hypertension Continue Coreg, Demadex and lisinopril.Currently controlled.    BPH (benign prostatic hyperplasia) Continue Flomax.    PVD/history of Amputation of left lower extremity below knee ([place]) Continue aspirin/Plavix.    Uncontrolled type II diabetes with peripheral autonomic neuropathy (HCC) Currently being managed with resistant scale SSI Q AC/HS and 55 units of Lantus daily. CBG range 118-205.    Dyslipidemia associated with type 2 diabetes mellitus (HCC) Continue Zocor.    Physical deconditioning PT/OT.    Anemia, chronic disease Given 1 unit of PRBCs 06/11/16 for hemoglobin of 6.6 mg/dL. Hemoglobin 7.9 mg/dL today, continue to monitor.    Intractable pain Continue Neurontin, fentanyl, Robaxin, and oxycodone.    Chronic diastolic CHF (congestive heart failure) ([place]) 2-D echocardiogram done 02/15/16, EF 50-55 percent. Continue  torsemide.  HIV screening The patient falls between the ages of 46-64, screened 08/15/15: Non-reactive.   Family Communication/Anticipated D/C date and plan/Code Status   DVT prophylaxis: Heparin ordered. Code Status: Full Code.  Family Communication: No family present at the bedside. Disposition Plan: Will need SNF placement. Social worker consulted.   Medical Consultants:    Vascular Surgery   Procedures:    ABIs  Arterial Dopplers  Anti-Infectives:    None  Subjective:   Right foot pain, rated 9/10, like a "toothache". No further nausea or vomiting. No BMs since admission.  Objective:    Vitals:   06/11/16 1330 06/11/16 1426 06/11/16 2019 06/12/16 0635  BP: 122/68 132/66 123/74 133/71  Pulse: 72 77 95 77  Resp: 18 18 18 18   Temp: 97.6 F (36.4 C) 98.7 F (37.1 C) 97.5 F (36.4 C) 98.6 F (37 C)  TempSrc: Oral Oral Oral Oral  SpO2: 99% 99% 99% 99%  Weight:      Height:        Intake/Output Summary (Last 24 hours) at 06/12/16 0752 Last data filed at 06/12/16 0557  Gross per 24 hour  Intake             1868 ml  Output             2300 ml  Net             -432 ml   Filed Weights   06/10/16 1315  Weight: 86.2 kg (190 lb)    Exam: General exam: Appears calm and comfortable. Sitting up in bed. Respiratory system: Clear to auscultation. Respiratory effort normal. Cardiovascular system: S1 & S2 heard, RRR. No JVD,  rubs, gallops or clicks. No  murmurs. Gastrointestinal system: Abdomen is nondistended, soft and nontender. No organomegaly or masses felt. Normal bowel sounds heard. Central nervous system: Alert and oriented. No focal neurological deficits. Extremities: Left BKA with well-healed stump, right lower extremity/foot as pictured below. Skin: Foot wounds as pictured below, otherwise skin dry. Psychiatry: Judgement and insight appear normal. Mood & affect appropriate.        Data Reviewed:   I have personally reviewed following labs and  imaging studies:  Labs: Basic Metabolic Panel:  Recent Labs Lab 06/10/16 1316 06/11/16 0434  NA 134* 135  K 4.4 3.8  CL 102 104  CO2 23 27  GLUCOSE 254* 180*  BUN 6 7  CREATININE 0.75 0.89  CALCIUM 9.2 8.6*   GFR Estimated Creatinine Clearance: 96 mL/min (by C-G formula based on SCr of 0.89 mg/dL). Liver Function Tests:  Recent Labs Lab 06/10/16 1316  AST 26  ALT 28  ALKPHOS 72  BILITOT 0.1*  PROT 8.6*  ALBUMIN 3.0*   No results for input(s): LIPASE, AMYLASE in the last 168 hours. No results for input(s): AMMONIA in the last 168 hours. Coagulation profile  Recent Labs Lab 06/10/16 1316  INR 1.12    CBC:  Recent Labs Lab 06/10/16 1316 06/11/16 0434 06/12/16 0524  WBC 9.3 9.4 9.5  NEUTROABS 6.7  --   --   HGB 8.1* 6.6* 7.9*  HCT 26.8* 22.2* 25.5*  MCV 81.7 81.9 82.3  PLT 500* 475* 466*   Cardiac Enzymes: No results for input(s): CKTOTAL, CKMB, CKMBINDEX, TROPONINI in the last 168 hours. BNP (last 3 results) No results for input(s): PROBNP in the last 8760 hours. CBG:  Recent Labs Lab 06/11/16 0635 06/11/16 1240 06/11/16 1635 06/11/16 2049 06/12/16 0634  GLUCAP 181* 145* 205* 132* 118*   D-Dimer: No results for input(s): DDIMER in the last 72 hours. Hgb A1c: No results for input(s): HGBA1C in the last 72 hours. Lipid Profile: No results for input(s): CHOL, HDL, LDLCALC, TRIG, CHOLHDL, LDLDIRECT in the last 72 hours. Thyroid function studies: No results for input(s): TSH, T4TOTAL, T3FREE, THYROIDAB in the last 72 hours.  Invalid input(s): FREET3 Anemia work up: No results for input(s): VITAMINB12, FOLATE, FERRITIN, TIBC, IRON, RETICCTPCT in the last 72 hours. Sepsis Labs:  Recent Labs Lab 06/10/16 1316 06/10/16 1344 06/11/16 0434 06/12/16 0524  WBC 9.3  --  9.4 9.5  LATICACIDVEN  --  1.32  --   --     Microbiology Recent Results (from the past 240 hour(s))  Culture, blood (Routine x 2)     Status: None (Preliminary result)    Collection Time: 06/10/16  1:30 PM  Result Value Ref Range Status   Specimen Description BLOOD RIGHT WRIST  Final   Special Requests IN PEDIATRIC BOTTLE Blood Culture adequate volume  Final   Culture NO GROWTH 1 DAY  Final   Report Status PENDING  Incomplete  Culture, blood (Routine x 2)     Status: None (Preliminary result)   Collection Time: 06/10/16  2:40 PM  Result Value Ref Range Status   Specimen Description BLOOD LEFT ANTECUBITAL  Final   Special Requests   Final    BOTTLES DRAWN AEROBIC AND ANAEROBIC Blood Culture adequate volume   Culture NO GROWTH 1 DAY  Final   Report Status PENDING  Incomplete    Radiology: Dg Foot Complete Right  Result Date: 06/10/2016 CLINICAL DATA:  Lateral soft tissue wound draining purulent material. EXAM: RIGHT FOOT COMPLETE - 3+ VIEW COMPARISON:  05/31/2016  FINDINGS: No acute fracture or dislocation is noted no underlying bony destruction is seen. No gross soft tissue abnormality is noted. IMPRESSION: No acute abnormality seen. Electronically Signed   By: Inez Catalina M.D.   On: 06/10/2016 15:38    Medications:   . allopurinol  200 mg Oral QPC supper  . aspirin  325 mg Oral Daily  . carvedilol  3.125 mg Oral BID WC  . clopidogrel  75 mg Oral Q breakfast  . ferrous sulfate  325 mg Oral Daily  . gabapentin  1,200 mg Oral QHS  . gabapentin  600 mg Oral 2 times per day  . heparin  5,000 Units Subcutaneous Q8H  . insulin aspart  0-20 Units Subcutaneous TID WC  . insulin aspart  0-5 Units Subcutaneous QHS  . insulin glargine  55 Units Subcutaneous QHS  . lisinopril  5 mg Oral Daily  . simvastatin  20 mg Oral Daily  . tamsulosin  0.4 mg Oral Daily  . torsemide  20 mg Oral Daily   Continuous Infusions: . sodium chloride 75 mL/hr at 06/10/16 1904  . piperacillin-tazobactam (ZOSYN)  IV 3.375 g (06/12/16 0712)  . vancomycin 1,000 mg (06/12/16 0413)    Medical decision making is of high complexity and this patient is at high risk of  deterioration, therefore this is a level 3 visit.  (> 4 problem points, 2 data points, high risk)   LOS: 2 days   Conor Lata  [facility] Pager [phone]. If unable to reach me by pager, please call my cell phone at [phone].  *Please refer to [email or web address], password [email or web address] to get updated schedule on who will round on this patient, as hospitalists switch teams weekly. If 7PM-7AM, please contact night-coverage at [email or web address], password [email or web address] for any overnight needs.  06/12/2016, 7:52 AM

## 2016-06-12 NOTE — Progress Notes (Addendum)
*  PRELIMINARY RESULTS* Vascular Ultrasound Right Lower Extremity Arterial Duplex has been completed.   Right lower extremity arteries are patent with monophasic flow throughout. The right femoral artery stent appears to be patent. There is evidence of elevated velocities in the distal right femoral artery suggestive of 30-49% stenosis. The right dorsalis pedis artery exhibits dampened monophasic flow.  VASCULAR LAB PRELIMINARY  ARTERIAL  ABI completed:    RIGHT     PRESSURE WAVEFORM  BRACHIAL 124 Triphasic  DP    AT    PT 128 Dampened monophasic  PER    GREAT TOE  NA    RIGHT LEFT  ABI 1.03 BKA   Right ABI is within normal limits at rest, however waveforms suggest this may be falsely elevated possibly due to medial calcification. Unable to obtain left ABI due to BKA.   06/12/2016 12:27 PM Maudry Mayhew, BS, RVT, RDCS, RDMS

## 2016-06-12 NOTE — Progress Notes (Addendum)
  [facility] Progress Note  Subjective   Wants his toes amputated because there "toes are starting to smell."  Objective Vitals:   06/11/16 2019 06/12/16 0635  BP: 123/74 133/71  Pulse: 95 77  Resp: 18 18  Temp: 97.5 F (36.4 C) 98.6 F (37 C)    Intake/Output Summary (Last 24 hours) at 06/12/16 0909 Last data filed at 06/12/16 8144  Gross per 24 hour  Intake             2104 ml  Output             2700 ml  Net             -596 ml   Palpable right popliteal pulse. Necrotic toes right foot  Assessment/Planning: 66 y.o. male with gangrenous right toes s/p right SFA stenting  Patient now willing to proceed with toe amputations ABIs and RLE duplex pending Will discuss with Dr. Donzetta Matters and schedule surgery.   [name] 06/12/2016 9:09 AM --  Laboratory CBC    Component Value Date/Time   WBC 9.5 06/12/2016 0524   HGB 7.9 (L) 06/12/2016 0524   HCT 25.5 (L) 06/12/2016 0524   HCT 26.3 (L) 11/26/2015 1440   HCT 32 10/31/2011 1319   PLT 466 (H) 06/12/2016 0524    BMET    Component Value Date/Time   NA 135 06/11/2016 0434   NA 134 (A) 12/27/2015   K 3.8 06/11/2016 0434   K 4.0 10/31/2011 1319   CL 104 06/11/2016 0434   CO2 27 06/11/2016 0434   GLUCOSE 180 (H) 06/11/2016 0434   BUN 7 06/11/2016 0434   BUN 16 12/27/2015   CREATININE 0.89 06/11/2016 0434   CREATININE 1.06 12/22/2015 1551   CALCIUM 8.6 (L) 06/11/2016 0434   CALCIUM 9.0 10/31/2011 1319   GFRNONAA >60 06/11/2016 0434   GFRAA >60 06/11/2016 0434    COAG Lab Results  Component Value Date   INR 1.12 06/10/2016   INR 1.17 05/31/2016   INR 1.2 (H) 10/19/2015   No results found for: PTT  Antibiotics Anti-infectives    Start     Dose/Rate Route Frequency Ordered Stop   06/10/16 2200  piperacillin-tazobactam (ZOSYN) IVPB 3.375 g     3.375 g 12.5 mL/hr over 240 Minutes Intravenous Every 8 hours 06/10/16 1548     06/10/16 1600  piperacillin-tazobactam (ZOSYN) IVPB 3.375 g      3.375 g 100 mL/hr over 30 Minutes Intravenous  Once 06/10/16 1547 06/10/16 1742   06/10/16 1600  vancomycin (VANCOCIN) IVPB 1000 mg/200 mL premix     1,000 mg 200 mL/hr over 60 Minutes Intravenous Every 12 hours 06/10/16 1547         [name], PA-C [facility] Office: [phone] Pager: [phone] 06/12/2016 9:09 AM  I have independently interviewed patient and agree with PA assessment and plan above. ABI is 1 although monophasic and stent is patent. Will plan right tma tomorrow in OR. Npo past midnight.   Euclid Cassetta C. Donzetta Matters, MD [facility] [place] Office: [phone] Pager: [phone]

## 2016-06-12 NOTE — Progress Notes (Signed)
Physical Therapy Treatment Patient Details Name: Cory Weber MRN: [id number] DOB: Aug 05, 1950 Today's Date: 06/12/2016    History of Present Illness Cory Weber is a 66 y.o. male with medical history significant of asthma, BPH, CAD, CVA, hepatitis C, hyperlipidemia, hypertension, peripheral vascular disease, diabetes, left BKA. Of note patient was discharged from the hospital  on 05/31/2016 after treatment for right ischemic foot secondary to severe PVD. During admission patient underwent arthrectomy of the right SFA and had a drug-coated balloon angioplasty with stent placement. Patient was started on Plavix and aspirin at that time. Patient states his right foot has been in severe pain since time of his previous admission with no improvement.     PT Comments    Pt attempted standing to RW on RLE but pain too great to maintain position or pivot to chair with use of RW. Lateral scoot pivot performed with min A +2. Pt positioned in chair with support under R foot and instructions to work on B quad sets as R knee tight from stent placement. PT will continue to follow.    Follow Up Recommendations  SNF     Equipment Recommendations  None recommended by PT    Recommendations for Other Services       Precautions / Restrictions Precautions Precautions: Fall Restrictions Weight Bearing Restrictions: No    Mobility  Bed Mobility Overal bed mobility: Needs Assistance Bed Mobility: Supine to Sit     Supine to sit: Supervision     General bed mobility comments: pt requests for both rails to be put up and uses UE to pull self into long sitting, then side rail down and pivots to EOB  Transfers Overall transfer level: Needs assistance Equipment used: Rolling walker (2 wheeled);None Transfers: Sit to/from Stand;Lateral/Scoot Transfers Sit to Stand: +2 physical assistance;Mod assist        Lateral/Scoot Transfers: +2 safety/equipment;Min assist General transfer comment: pt  performed sit to stand with RW from elevated surface with +2 mod A and was unable to achieve full upright posture or tolerate for more than a few seconds. Scoot transfer used bed to chair with min A +2 for safety   Ambulation/Gait             General Gait Details: unable to hop on R foot due to pain and R prosthesis not present   Stairs            Wheelchair Mobility    Modified Rankin (Stroke Patients Only)       Balance Overall balance assessment: Needs assistance Sitting-balance support: No upper extremity supported Sitting balance-Leahy Scale: Good Sitting balance - Comments: able to wt shift and reach in sitting with no LOB   Standing balance support: Bilateral upper extremity supported Standing balance-Leahy Scale: Zero Standing balance comment: unable to maintain standing without full support                            Cognition Arousal/Alertness: Awake/alert Behavior During Therapy: WFL for tasks assessed/performed Overall Cognitive Status: Within Functional Limits for tasks assessed                                        Exercises Amputee Exercises Quad Sets: AROM;Both;10 reps;Seated Knee Flexion: AROM;Left;10 reps;Seated Straight Leg Raises: AROM;Both;5 reps;Seated    General Comments  Pertinent Vitals/Pain Pain Assessment: Faces Faces Pain Scale: Hurts even more Pain Location: R foot Pain Descriptors / Indicators: Burning;Aching Pain Intervention(s): Limited activity within patient's tolerance;Monitored during session;Premedicated before session    Home Living                      Prior Function            PT Goals (current goals can now be found in the care plan section) Acute Rehab PT Goals Patient Stated Goal: To be more independent and eventually try to walk agaqin. PT Goal Formulation: With patient Time For Goal Achievement: 06/25/16 Potential to Achieve Goals: Good Progress towards PT  goals: Progressing toward goals    Frequency    Min 3X/week      PT Plan Current plan remains appropriate    Co-evaluation PT/OT/SLP Co-Evaluation/Treatment: Yes Reason for Co-Treatment: For patient/therapist safety PT goals addressed during session: Mobility/safety with mobility;Balance;Proper use of DME        AM-PAC PT "6 Clicks" Daily Activity  Outcome Measure  Difficulty turning over in bed (including adjusting bedclothes, sheets and blankets)?: A Little Difficulty moving from lying on back to sitting on the side of the bed? : Total Difficulty sitting down on and standing up from a chair with arms (e.g., wheelchair, bedside commode, etc,.)?: Total Help needed moving to and from a bed to chair (including a wheelchair)?: A Lot Help needed walking in hospital room?: Total Help needed climbing 3-5 steps with a railing? : Total 6 Click Score: 9    End of Session Equipment Utilized During Treatment: Gait belt Activity Tolerance: Patient tolerated treatment well Patient left: in chair;with call bell/phone within reach Nurse Communication: Mobility status PT Visit Diagnosis: Muscle weakness (generalized) (M62.81);Other abnormalities of gait and mobility (R26.89) Pain - Right/Left: Right Pain - part of body: Ankle and joints of foot     Time: 1429-1449 PT Time Calculation (min) (ACUTE ONLY): 20 min  Charges:  $Therapeutic Activity: 8-22 mins                    G Codes:       Leighton Roach, PT  Acute Rehab Services  [place] 06/12/2016, 3:53 PM

## 2016-06-13 ENCOUNTER — Inpatient Hospital Stay (HOSPITAL_COMMUNITY): Payer: Medicaid Other | Admitting: Anesthesiology

## 2016-06-13 ENCOUNTER — Encounter (HOSPITAL_COMMUNITY): Payer: Self-pay | Admitting: Anesthesiology

## 2016-06-13 ENCOUNTER — Encounter (HOSPITAL_COMMUNITY)
Admission: EM | Disposition: A | Discharge status: Skilled Nursing Facility | Payer: Self-pay | Source: Home / Self Care | Attending: Internal Medicine

## 2016-06-13 DIAGNOSIS — L089 Local infection of the skin and subcutaneous tissue, unspecified: Secondary | ICD-10-CM

## 2016-06-13 DIAGNOSIS — I96 Gangrene, not elsewhere classified: Secondary | ICD-10-CM

## 2016-06-13 DIAGNOSIS — T148XXA Other injury of unspecified body region, initial encounter: Secondary | ICD-10-CM

## 2016-06-13 HISTORY — PX: TRANSMETATARSAL AMPUTATION: SHX6197

## 2016-06-13 LAB — CBC
HCT: 23.9 % — ABNORMAL LOW (ref 39.0–52.0)
HEMOGLOBIN: 7.1 g/dL — AB (ref 13.0–17.0)
MCH: 24.8 pg — AB (ref 26.0–34.0)
MCHC: 29.7 g/dL — ABNORMAL LOW (ref 30.0–36.0)
MCV: 83.6 fL (ref 78.0–100.0)
Platelets: 506 10*3/uL — ABNORMAL HIGH (ref 150–400)
RBC: 2.86 MIL/uL — AB (ref 4.22–5.81)
RDW: 17 % — ABNORMAL HIGH (ref 11.5–15.5)
WBC: 9.7 10*3/uL (ref 4.0–10.5)

## 2016-06-13 LAB — VANCOMYCIN, TROUGH: Vancomycin Tr: 19 ug/mL (ref 15–20)

## 2016-06-13 LAB — BASIC METABOLIC PANEL
ANION GAP: 8 (ref 5–15)
BUN: 16 mg/dL (ref 6–20)
CALCIUM: 8.4 mg/dL — AB (ref 8.9–10.3)
CO2: 23 mmol/L (ref 22–32)
Chloride: 99 mmol/L — ABNORMAL LOW (ref 101–111)
Creatinine, Ser: 1.24 mg/dL (ref 0.61–1.24)
GFR calc non Af Amer: >60 mL/min (ref >60)
Glucose, Bld: 214 mg/dL — ABNORMAL HIGH (ref 65–99)
POTASSIUM: 4.1 mmol/L (ref 3.5–5.1)
Sodium: 130 mmol/L — ABNORMAL LOW (ref 135–145)

## 2016-06-13 LAB — GLUCOSE, CAPILLARY
GLUCOSE-CAPILLARY: 191 mg/dL — AB (ref 65–99)
GLUCOSE-CAPILLARY: 71 mg/dL (ref 65–99)
GLUCOSE-CAPILLARY: 76 mg/dL (ref 65–99)
Glucose-Capillary: 91 mg/dL (ref 65–99)
Glucose-Capillary: 113 mg/dL — ABNORMAL HIGH (ref 65–99)
Glucose-Capillary: 348 mg/dL — ABNORMAL HIGH (ref 65–99)

## 2016-06-13 SURGERY — AMPUTATION, FOOT, TRANSMETATARSAL
Anesthesia: General | Site: Foot | Laterality: Right

## 2016-06-13 MED ORDER — HEPARIN SODIUM (PORCINE) 5000 UNIT/ML IJ SOLN
5000.0000 [IU] | Freq: Three times a day (TID) | INTRAMUSCULAR | Status: DC
  Administered 2016-06-14 – 2016-06-17 (×10): 5000 [IU] via SUBCUTANEOUS
  Filled 2016-06-13 (×9): qty 1

## 2016-06-13 MED ORDER — LACTATED RINGERS IV SOLN
INTRAVENOUS | Status: DC | PRN
  Administered 2016-06-13: 13:00:00 via INTRAVENOUS

## 2016-06-13 MED ORDER — MIDAZOLAM HCL 2 MG/2ML IJ SOLN: 2.0000 mg | Freq: Once | INTRAMUSCULAR | Status: DC

## 2016-06-13 MED ORDER — MIDAZOLAM HCL 2 MG/2ML IJ SOLN
INTRAMUSCULAR | Status: AC
  Administered 2016-06-13: 2 mg
  Filled 2016-06-13: qty 2

## 2016-06-13 MED ORDER — SENNOSIDES-DOCUSATE SODIUM 8.6-50 MG PO TABS
1.0000 | ORAL_TABLET | Freq: Two times a day (BID) | ORAL | Status: DC
  Administered 2016-06-14 – 2016-06-16 (×6): 1 via ORAL
  Filled 2016-06-13 (×8): qty 1

## 2016-06-13 MED ORDER — MIDAZOLAM HCL 2 MG/2ML IJ SOLN
INTRAMUSCULAR | Status: AC
  Filled 2016-06-13: qty 2

## 2016-06-13 MED ORDER — PHENYLEPHRINE 40 MCG/ML (10ML) SYRINGE FOR IV PUSH (FOR BLOOD PRESSURE SUPPORT)
PREFILLED_SYRINGE | INTRAVENOUS | Status: AC
  Filled 2016-06-13: qty 10

## 2016-06-13 MED ORDER — ONDANSETRON HCL 4 MG/2ML IJ SOLN
INTRAMUSCULAR | Status: AC
  Filled 2016-06-13: qty 2

## 2016-06-13 MED ORDER — ONDANSETRON HCL 4 MG/2ML IJ SOLN
INTRAMUSCULAR | Status: DC | PRN
  Administered 2016-06-13: 4 mg via INTRAVENOUS

## 2016-06-13 MED ORDER — PROPOFOL 10 MG/ML IV BOLUS
INTRAVENOUS | Status: AC
  Filled 2016-06-13: qty 20

## 2016-06-13 MED ORDER — PHENYLEPHRINE HCL 10 MG/ML IJ SOLN
INTRAMUSCULAR | Status: DC | PRN
  Administered 2016-06-13: 25 ug/min via INTRAVENOUS

## 2016-06-13 MED ORDER — LIDOCAINE HCL (PF) 1 % IJ SOLN
INTRAMUSCULAR | Status: DC | PRN
  Administered 2016-06-13: 20 mL

## 2016-06-13 MED ORDER — SUGAMMADEX SODIUM 200 MG/2ML IV SOLN
INTRAVENOUS | Status: AC
  Filled 2016-06-13: qty 2

## 2016-06-13 MED ORDER — FENTANYL CITRATE (PF) 100 MCG/2ML IJ SOLN: 100.0000 ug | Freq: Once | INTRAMUSCULAR | Status: DC

## 2016-06-13 MED ORDER — DEXAMETHASONE SODIUM PHOSPHATE 10 MG/ML IJ SOLN
INTRAMUSCULAR | Status: AC
  Filled 2016-06-13: qty 1

## 2016-06-13 MED ORDER — PHENYLEPHRINE HCL 10 MG/ML IJ SOLN
INTRAMUSCULAR | Status: DC | PRN
  Administered 2016-06-13 (×3): 80 ug via INTRAVENOUS

## 2016-06-13 MED ORDER — LIDOCAINE 2% (20 MG/ML) 5 ML SYRINGE
INTRAMUSCULAR | Status: AC
  Filled 2016-06-13: qty 5

## 2016-06-13 MED ORDER — BUPIVACAINE-EPINEPHRINE (PF) 0.5% -1:200000 IJ SOLN
INTRAMUSCULAR | Status: DC | PRN
  Administered 2016-06-13: 30 mL via PERINEURAL

## 2016-06-13 MED ORDER — FENTANYL CITRATE (PF) 100 MCG/2ML IJ SOLN
INTRAMUSCULAR | Status: AC
  Administered 2016-06-13: 100 ug
  Filled 2016-06-13: qty 2

## 2016-06-13 MED ORDER — FENTANYL CITRATE (PF) 250 MCG/5ML IJ SOLN
INTRAMUSCULAR | Status: AC
  Filled 2016-06-13: qty 5

## 2016-06-13 MED ORDER — LIDOCAINE HCL 1 % IJ SOLN
INTRAMUSCULAR | Status: AC
Start: 2016-06-13 — End: 2016-06-13
  Filled 2016-06-13: qty 20

## 2016-06-13 MED ORDER — PROPOFOL 10 MG/ML IV BOLUS
INTRAVENOUS | Status: DC | PRN
  Administered 2016-06-13: 200 mg via INTRAVENOUS

## 2016-06-13 MED ORDER — 0.9 % SODIUM CHLORIDE (POUR BTL) OPTIME
TOPICAL | Status: DC | PRN
  Administered 2016-06-13: 1000 mL

## 2016-06-13 SURGICAL SUPPLY — 40 items
BANDAGE ACE 4X5 VEL STRL LF (GAUZE/BANDAGES/DRESSINGS) ×3 IMPLANT
BLADE AVERAGE 25MMX9MM (BLADE) ×1
BLADE AVERAGE 25X9 (BLADE) ×2 IMPLANT
BLADE SAW SGTL 81X20 HD (BLADE) IMPLANT
BNDG CONFORM 3 STRL LF (GAUZE/BANDAGES/DRESSINGS) IMPLANT
BNDG GAUZE ELAST 4 BULKY (GAUZE/BANDAGES/DRESSINGS) ×3 IMPLANT
CANISTER SUCT 3000ML PPV (MISCELLANEOUS) ×3 IMPLANT
COVER SURGICAL LIGHT HANDLE (MISCELLANEOUS) ×3 IMPLANT
DRAPE HALF SHEET 40X57 (DRAPES) ×3 IMPLANT
DRSG VAC ATS SM SENSATRAC (GAUZE/BANDAGES/DRESSINGS) IMPLANT
ELECT REM PT RETURN 9FT ADLT (ELECTROSURGICAL) ×3
ELECTRODE REM PT RTRN 9FT ADLT (ELECTROSURGICAL) ×1 IMPLANT
GAUZE SPONGE 4X4 12PLY STRL (GAUZE/BANDAGES/DRESSINGS) ×3 IMPLANT
GLOVE BIO SURGEON STRL SZ7.5 (GLOVE) ×6 IMPLANT
GLOVE BIOGEL PI IND STRL 6.5 (GLOVE) ×1 IMPLANT
GLOVE BIOGEL PI IND STRL 7.0 (GLOVE) ×1 IMPLANT
GLOVE BIOGEL PI INDICATOR 6.5 (GLOVE) ×2
GLOVE BIOGEL PI INDICATOR 7.0 (GLOVE) ×2
GLOVE ECLIPSE 6.5 STRL STRAW (GLOVE) ×3 IMPLANT
GLOVE ECLIPSE 7.5 STRL STRAW (GLOVE) ×3 IMPLANT
GOWN STRL REUS W/ TWL LRG LVL3 (GOWN DISPOSABLE) ×2 IMPLANT
GOWN STRL REUS W/ TWL XL LVL3 (GOWN DISPOSABLE) ×2 IMPLANT
GOWN STRL REUS W/TWL LRG LVL3 (GOWN DISPOSABLE) ×6
GOWN STRL REUS W/TWL XL LVL3 (GOWN DISPOSABLE) ×4
KIT BASIN OR (CUSTOM PROCEDURE TRAY) ×3 IMPLANT
KIT ROOM TURNOVER OR (KITS) ×3 IMPLANT
NEEDLE HYPO 25GX1X1/2 BEV (NEEDLE) IMPLANT
NS IRRIG 1000ML POUR BTL (IV SOLUTION) ×3 IMPLANT
PACK GENERAL/GYN (CUSTOM PROCEDURE TRAY) ×3 IMPLANT
PAD ARMBOARD 7.5X6 YLW CONV (MISCELLANEOUS) ×6 IMPLANT
SPECIMEN JAR SMALL (MISCELLANEOUS) IMPLANT
SUT ETHILON 3 0 PS 1 (SUTURE) ×9 IMPLANT
SUT VIC AB 2-0 CT1 27 (SUTURE) ×6
SUT VIC AB 2-0 CT1 TAPERPNT 27 (SUTURE) ×2 IMPLANT
SWAB CULTURE ESWAB REG 1ML (MISCELLANEOUS) IMPLANT
SYR CONTROL 10ML LL (SYRINGE) IMPLANT
TOWEL OR 17X24 6PK STRL BLUE (TOWEL DISPOSABLE) ×3 IMPLANT
TOWEL OR 17X26 10 PK STRL BLUE (TOWEL DISPOSABLE) ×3 IMPLANT
UNDERPAD 30X30 (UNDERPADS AND DIAPERS) ×3 IMPLANT
WATER STERILE IRR 1000ML POUR (IV SOLUTION) ×3 IMPLANT

## 2016-06-13 NOTE — Progress Notes (Signed)
Pharmacy Antibiotic Note  KIZER NOBBE is a 66 y.o. male admitted on 06/10/2016 with cellulitis.  Pharmacy has been consulted for vancomycin and zosyn dosing.  Continues on broad spectrum abx for cellulitis / gangrene toes. Possible underlying osteo. Patient refused amputation of toes, but now is ready to proceed. Vascular to schedule surgery. Afebrile, WBC wnl. SCr up a little to 1.24, CrCl ~93ml/min.  Plan: Continue vancomycin 1gm IV Q12h Continue Zosyn 3.375gm IV Q8H (4 hr inf) Monitor clinical picture, renal function closely, check VT today F/U C&S, abx deescalation / LOT  Height: 6\' 1"  (185.4 cm) Weight: 190 lb (86.2 kg) IBW/kg (Calculated) : 79.9  Temp (24hrs), Avg:97.7 F (36.5 C), Min:97.5 F (36.4 C), Max:98 F (36.7 C)   Recent Labs Lab 06/10/16 1316 06/10/16 1344 06/11/16 0434 06/12/16 0524 06/13/16 0445  WBC 9.3  --  9.4 9.5 9.7  CREATININE 0.75  --  0.89  --  1.24  LATICACIDVEN  --  1.32  --   --   --     Estimated Creatinine Clearance: 68.9 mL/min (by C-G formula based on SCr of 1.24 mg/dL).    No Known Allergies  Antimicrobials this admission: Vanc 4/30>> Zosyn 4/30>>  Dose adjustments this admission: N/A  Microbiology results: 4/30 BCx: ngtd  Thank you for allowing pharmacy to be a part of this patient's care.  Elenor Quinones, PharmD, BCPS Clinical Pharmacist Pager [phone] 06/13/2016 8:20 AM

## 2016-06-13 NOTE — Progress Notes (Signed)
  Progress Note    06/13/2016 11:51 AM Day of Surgery  Subjective:  Having pain in right foot  Vitals:   06/12/16 2123 06/13/16 0420  BP: 135/69 128/70  Pulse: 83 76  Resp: 18 18  Temp: 97.7 F (36.5 C) 97.5 F (36.4 C)    Physical Exam: aaox3 Non labored respirations Abdomen is soft Palpable right popliteal pulse Dry gangrene right toes  CBC    Component Value Date/Time   WBC 9.7 06/13/2016 0445   RBC 2.86 (L) 06/13/2016 0445   HGB 7.1 (L) 06/13/2016 0445   HCT 23.9 (L) 06/13/2016 0445   HCT 26.3 (L) 11/26/2015 1440   HCT 32 10/31/2011 1319   PLT 506 (H) 06/13/2016 0445   MCV 83.6 06/13/2016 0445   MCV 82.8 10/31/2011 1319   MCH 24.8 (L) 06/13/2016 0445   MCHC 29.7 (L) 06/13/2016 0445   RDW 17.0 (H) 06/13/2016 0445   LYMPHSABS 2.1 06/10/2016 1316   MONOABS 0.4 06/10/2016 1316   EOSABS 0.1 06/10/2016 1316   BASOSABS 0.0 06/10/2016 1316    BMET    Component Value Date/Time   NA 130 (L) 06/13/2016 0445   NA 134 (A) 12/27/2015   K 4.1 06/13/2016 0445   K 4.0 10/31/2011 1319   CL 99 (L) 06/13/2016 0445   CO2 23 06/13/2016 0445   GLUCOSE 214 (H) 06/13/2016 0445   BUN 16 06/13/2016 0445   BUN 16 12/27/2015   CREATININE 1.24 06/13/2016 0445   CREATININE 1.06 12/22/2015 1551   CALCIUM 8.4 (L) 06/13/2016 0445   CALCIUM 9.0 10/31/2011 1319   GFRNONAA >60 06/13/2016 0445   GFRAA >60 06/13/2016 0445    INR    Component Value Date/Time   INR 1.12 06/10/2016 1316     Intake/Output Summary (Last 24 hours) at 06/13/16 1151 Last data filed at 06/13/16 1052  Gross per 24 hour  Intake              236 ml  Output                0 ml  Net              236 ml     Assessment/Planning: 66 y.o. male with gangrenous right toes s/p right SFA stenting. Right tma tomorrow.   Raelene Trew C. Donzetta Matters, MD [facility] [place] Office: [phone] Pager: [phone]  06/13/2016 11:51 AM

## 2016-06-13 NOTE — Anesthesia Procedure Notes (Signed)
Procedure Name: LMA Insertion Date/Time: 06/13/2016 1:14 PM Performed by: Luciana Axe K Pre-anesthesia Checklist: Patient identified, Emergency Drugs available, Suction available and Patient being monitored Patient Re-evaluated:Patient Re-evaluated prior to inductionOxygen Delivery Method: Circle System Utilized Preoxygenation: Pre-oxygenation with 100% oxygen Intubation Type: IV induction Ventilation: Mask ventilation without difficulty LMA: LMA inserted LMA Size: 5.0 Number of attempts: 1 Airway Equipment and Method: Bite block Placement Confirmation: positive ETCO2 and breath sounds checked- equal and bilateral Tube secured with: Tape Dental Injury: Teeth and Oropharynx as per pre-operative assessment

## 2016-06-13 NOTE — Anesthesia Procedure Notes (Signed)
Anesthesia Regional Block: Popliteal block   Pre-Anesthetic Checklist: ,, timeout performed, Correct Patient, Correct Site, Correct Laterality, Correct Procedure, Correct Position, site marked, Risks and benefits discussed,  Surgical consent,  Pre-op evaluation,  At surgeon's request and post-op pain management  Laterality: Right  Prep: chloraprep       Needles:  Injection technique: Single-shot  Needle Type: Echogenic Stimulator Needle          Additional Needles:   Procedures: ultrasound guided,,,,,,,,  Narrative:  Start time: 06/13/2016 12:41 PM End time: 06/13/2016 12:51 PM Injection made incrementally with aspirations every 5 mL.  Performed by: Personally  Anesthesiologist: Duane Boston  Additional Notes: A functioning IV was confirmed and monitors were applied.  Sterile prep and drape, hand hygiene and sterile gloves were used.  Negative aspiration and test dose prior to incremental administration of local anesthetic. The patient tolerated the procedure well.Ultrasound  guidance: relevant anatomy identified, needle position confirmed, local anesthetic spread visualized around nerve(s), vascular puncture avoided.  Image printed for medical record.

## 2016-06-13 NOTE — Anesthesia Preprocedure Evaluation (Addendum)
Anesthesia Evaluation  Patient identified by MRN, date of birth, ID band Patient awake    Reviewed: Allergy & Precautions, NPO status , Patient's Chart, lab work & pertinent test results, reviewed documented beta blocker date and time   Airway Mallampati: II  TM Distance: >3 FB Neck ROM: Full    Dental  (+) Edentulous Upper, Edentulous Lower, Dental Advisory Given   Pulmonary asthma , former smoker,    Pulmonary exam normal        Cardiovascular hypertension, Pt. on medications and Pt. on home beta blockers + Peripheral Vascular Disease  Normal cardiovascular exam     Neuro/Psych CVA, No Residual Symptoms negative neurological ROS  negative psych ROS   GI/Hepatic negative GI ROS, (+)     substance abuse  , Hepatitis -, C  Endo/Other  diabetes, Type 2, Insulin Dependent, Oral Hypoglycemic Agents  Renal/GU negative Renal ROS  negative genitourinary   Musculoskeletal negative musculoskeletal ROS (+)   Abdominal   Peds negative pediatric ROS (+)  Hematology negative hematology ROS (+)   Anesthesia Other Findings   Reproductive/Obstetrics negative OB ROS                           Anesthesia Physical  Anesthesia Plan  ASA: III  Anesthesia Plan: General   Post-op Pain Management: GA combined w/ Regional for post-op pain   Induction:   Airway Management Planned: LMA  Additional Equipment:   Intra-op Plan:   Post-operative Plan: Extubation in OR  Informed Consent: I have reviewed the patients History and Physical, chart, labs and discussed the procedure including the risks, benefits and alternatives for the proposed anesthesia with the patient or authorized representative who has indicated his/her understanding and acceptance.   Dental advisory given  Plan Discussed with: CRNA, Anesthesiologist and Surgeon  Anesthesia Plan Comments:        Anesthesia Quick Evaluation

## 2016-06-13 NOTE — Transfer of Care (Signed)
Immediate Anesthesia Transfer of Care Note  Patient: Cory Weber  Procedure(s) Performed: Procedure(s): RIGHT TRANSMETATARSAL AMPUTATION (Right)  Patient Location: PACU  Anesthesia Type:General  Level of Consciousness: awake, oriented and patient cooperative  Airway & Oxygen Therapy: Patient Spontanous Breathing and Patient connected to face mask oxygen  Post-op Assessment: Report given to RN and Post -op Vital signs reviewed and stable  Post vital signs: Reviewed  Last Vitals:  Vitals:   06/13/16 0420 06/13/16 1439  BP: 128/70 128/78  Pulse: 76 78  Resp: 18 18  Temp: 36.4 C 37.1 C    Last Pain:  Vitals:   06/13/16 0833  TempSrc:   PainSc: 9          Complications: No apparent anesthesia complications

## 2016-06-13 NOTE — Progress Notes (Signed)
Progress Note    Cory Weber  TMA:[id number] DOB: November 13, 1950  DOA: 06/10/2016 PCP: Maggie Font, MD    Brief Narrative:   Chief complaint: Follow-up right foot pain  Medical records reviewed and are as summarized below:  Cory Weber is an 66 y.o. male with a PMH of asthma, BPH, CAD, history of CVA, hepatitis C, hyperlipidemia, hypertension, PVD with left BKA, diabetes, and recent hospitalization 05/31/16-06/04/16 for treatment of ischemia of right foot with gangrene, status post arthrectomy and balloon angioplasty of right SFA with stent, discharged on aspirin and Plavix, who was admitted 06/10/16 for severe right foot pain unrelieved by oral opiates.  Assessment/Plan:   Principal Problem:   Wet gangrene (HCC)/Ischemia of foot Evaluated by vascular surgery. Will need Foot amputation, to be done later today. ABIs/right lower extremity duplex ordered by vascular surgery. Right ABI is within normal limits at rest, however waveforms suggest this may be falsely elevated possibly due to medial calcification.  Arterial dopplers showed: Mild to moderate smooth heterogenous plaque noted throughout the right lower extremity arteries. Right lower extremity arteries are patent with monophasic flow throughout. The right femoral artery stent appears to be patent. There is evidence of elevated velocities in the distal right femoral artery suggestive of 30-49% stenosis. Remains on empiric Zosyn/vancomycin. Blood cultures negative to date.  Active Problems:   Essential hypertension Continue Coreg, Demadex and lisinopril.Currently controlled.    BPH (benign prostatic hyperplasia) Continue Flomax.    PVD/history of Amputation of left lower extremity below knee ([name]) Continue aspirin/Plavix.    Uncontrolled type II diabetes with peripheral autonomic neuropathy (HCC) Currently being managed with resistant scale SSI Q AC/HS and 55 units of Lantus daily. CBG range 73-191.    Dyslipidemia  associated with type 2 diabetes mellitus (HCC) Continue Zocor.    Physical deconditioning PT/OT.    Anemia, chronic disease Given 1 unit of PRBCs 06/11/16 for hemoglobin of 6.6 mg/dL. Hemoglobin 7.1 mg/dL today, continue to monitor.    Intractable pain Continue Neurontin, fentanyl, Robaxin, and oxycodone.    Chronic diastolic CHF (congestive heart failure) ([place]) 2-D echocardiogram done 02/15/16, EF 50-55 percent. Continue torsemide.  HIV screening The patient falls between the ages of 52-64, screened 08/15/15: Non-reactive.   Family Communication/Anticipated D/C date and plan/Code Status   DVT prophylaxis: Heparin ordered. Code Status: Full Code.  Family Communication: No family present at the bedside. Disposition Plan: Will need SNF placement. Social worker consulted.   Medical Consultants:    Vascular Surgery   Procedures:    ABIs  Arterial Dopplers  Anti-Infectives:    None  Subjective:   Right foot pain, rated 7/10. No BMs since admission. No abdominal pain or nausea.Has decided to proceed with foot amputation.  Objective:    Vitals:   06/12/16 0635 06/12/16 1505 06/12/16 2123 06/13/16 0420  BP: 133/71 99/69 135/69 128/70  Pulse: 77 71 83 76  Resp: 18 18 18 18   Temp: 98.6 F (37 C) 98 F (36.7 C) 97.7 F (36.5 C) 97.5 F (36.4 C)  TempSrc: Oral Axillary Oral Oral  SpO2: 99% 96% 100% 100%  Weight:      Height:        Intake/Output Summary (Last 24 hours) at 06/13/16 0750 Last data filed at 06/12/16 1500  Gross per 24 hour  Intake              472 ml  Output  400 ml  Net               72 ml   Filed Weights   06/10/16 1315  Weight: 86.2 kg (190 lb)    Exam: General exam: Appears calm and comfortable.  Respiratory system: Clear to auscultation. Respiratory effort normal. Cardiovascular system: S1 & S2 heard, RRR. No JVD,  rubs, gallops or clicks. No murmurs. Gastrointestinal system: Abdomen is nondistended, soft and nontender.  No organomegaly or masses felt. Normal bowel sounds heard. Central nervous system: Alert and oriented. No focal neurological deficits. Extremities: Left BKA with well-healed stump, right lower extremity/foot as pictured below. Skin: Foot wounds as pictured below, otherwise skin dry. Psychiatry: Judgement and insight appear normal. Mood & affect appropriate.          Data Reviewed:   I have personally reviewed following labs and imaging studies:  Labs: Basic Metabolic Panel:  Recent Labs Lab 06/10/16 1316 06/11/16 0434 06/13/16 0445  NA 134* 135 130*  K 4.4 3.8 4.1  CL 102 104 99*  CO2 23 27 23   GLUCOSE 254* 180* 214*  BUN 6 7 16   CREATININE 0.75 0.89 1.24  CALCIUM 9.2 8.6* 8.4*   GFR Estimated Creatinine Clearance: 68.9 mL/min (by C-G formula based on SCr of 1.24 mg/dL). Liver Function Tests:  Recent Labs Lab 06/10/16 1316  AST 26  ALT 28  ALKPHOS 72  BILITOT 0.1*  PROT 8.6*  ALBUMIN 3.0*   No results for input(s): LIPASE, AMYLASE in the last 168 hours. No results for input(s): AMMONIA in the last 168 hours. Coagulation profile  Recent Labs Lab 06/10/16 1316  INR 1.12    CBC:  Recent Labs Lab 06/10/16 1316 06/11/16 0434 06/12/16 0524 06/13/16 0445  WBC 9.3 9.4 9.5 9.7  NEUTROABS 6.7  --   --   --   HGB 8.1* 6.6* 7.9* 7.1*  HCT 26.8* 22.2* 25.5* 23.9*  MCV 81.7 81.9 82.3 83.6  PLT 500* 475* 466* 506*   CBG:  Recent Labs Lab 06/12/16 0634 06/12/16 1235 06/12/16 1639 06/12/16 2123 06/13/16 0642  GLUCAP 118* 73 167* 174* 191*   Sepsis Labs:  Recent Labs Lab 06/10/16 1316 06/10/16 1344 06/11/16 0434 06/12/16 0524 06/13/16 0445  WBC 9.3  --  9.4 9.5 9.7  LATICACIDVEN  --  1.32  --   --   --     Microbiology Recent Results (from the past 240 hour(s))  Culture, blood (Routine x 2)     Status: None (Preliminary result)   Collection Time: 06/10/16  1:30 PM  Result Value Ref Range Status   Specimen Description BLOOD RIGHT  WRIST  Final   Special Requests IN PEDIATRIC BOTTLE Blood Culture adequate volume  Final   Culture NO GROWTH 2 DAYS  Final   Report Status PENDING  Incomplete  Culture, blood (Routine x 2)     Status: None (Preliminary result)   Collection Time: 06/10/16  2:40 PM  Result Value Ref Range Status   Specimen Description BLOOD LEFT ANTECUBITAL  Final   Special Requests   Final    BOTTLES DRAWN AEROBIC AND ANAEROBIC Blood Culture adequate volume   Culture NO GROWTH 2 DAYS  Final   Report Status PENDING  Incomplete    Radiology: No results found.  Medications:   . allopurinol  200 mg Oral QPC supper  . aspirin  325 mg Oral Daily  . carvedilol  3.125 mg Oral BID WC  . clopidogrel  75 mg Oral  Q breakfast  . ferrous sulfate  325 mg Oral Daily  . gabapentin  1,200 mg Oral QHS  . gabapentin  600 mg Oral 2 times per day  . heparin  5,000 Units Subcutaneous Q8H  . insulin aspart  0-20 Units Subcutaneous TID WC  . insulin aspart  0-5 Units Subcutaneous QHS  . insulin glargine  55 Units Subcutaneous QHS  . lisinopril  5 mg Oral Daily  . polyethylene glycol  17 g Oral Daily  . simvastatin  20 mg Oral Daily  . tamsulosin  0.4 mg Oral Daily  . torsemide  20 mg Oral Daily   Continuous Infusions: . sodium chloride 75 mL/hr at 06/12/16 2230  . piperacillin-tazobactam (ZOSYN)  IV 3.375 g (06/13/16 5749)  . vancomycin 1,000 mg (06/13/16 3552)    Medical decision making is of high complexity and this patient is at high risk of deterioration, therefore this is a level 3 visit.  (> 4 problem points, 2 data points, high risk)   LOS: 3 days   Nasier Thumm  [facility] Pager [phone]. If unable to reach me by pager, please call my cell phone at [phone].  *Please refer to [email or web address], password [email or web address] to get updated schedule on who will round on this patient, as hospitalists switch teams weekly. If 7PM-7AM, please contact night-coverage at [email or web address], password [email or web address] for any overnight  needs.  06/13/2016, 7:50 AM

## 2016-06-13 NOTE — Anesthesia Postprocedure Evaluation (Signed)
Anesthesia Post Note  Patient: Cory Weber  Procedure(s) Performed: Procedure(s) (LRB): RIGHT TRANSMETATARSAL AMPUTATION (Right)  Patient location during evaluation: PACU Anesthesia Type: General Level of consciousness: sedated Pain management: pain level controlled Vital Signs Assessment: post-procedure vital signs reviewed and stable Respiratory status: spontaneous breathing and respiratory function stable Cardiovascular status: stable Anesthetic complications: no       Last Vitals:  Vitals:   06/13/16 1454 06/13/16 1500  BP: 128/79 135/79  Pulse: 76 75  Resp: 13 16  Temp:  37 C    Last Pain:  Vitals:   06/13/16 0903  TempSrc:   PainSc: Asleep        RLE Motor Response: Purposeful movement;Responds to commands (06/13/16 1500) RLE Sensation: Decreased (due to nerve block) (06/13/16 1500)      Cindia Hustead DANIEL

## 2016-06-13 NOTE — Op Note (Signed)
    Patient name: Cory Weber MRN: [id number] DOB: [birth year] Sex: male  06/13/2016 Pre-operative Diagnosis: pad, necrosis of right toes Post-operative diagnosis:  Same Surgeon:  Eda Paschal. Donzetta Matters, MD Assistant: OR nurse Procedure Performed: right transmetatarsal amputation  Indications:  66 year old male history of a left-sided below-knee amputation now has gangrene of all of his right-sided toes. He has recent revascularization of his superficial femoral artery and is now indicated for right tma  Findings: There was adequate bleeding in the wound bed for healing. There was pus in the first metatarsophalangeal joint space. A completion the wound was well approximated and there were signals at the pt and dp.    Procedure:  The patient was identified in the holding area and taken to the operating room is placed supine on the operating table general endotracheal anesthesia was induced his given antibiotics sterile prep and draped in the right foot usual fashion. Timeout was called and we began with a [place] incision was right foot. Dissected down through skin and subcutaneous tissue to the bones around the entirety of the incision using electrocautery. I then transected for the bones with bone cutter. A saw was used on the first metatarsal given its size. Specimen was then passed off. I had encountered some pus in the first metatarsophalangeal joint space. The wound was thoroughly irrigated hemostasis obtained. The bones were quite soft and were removed with rongeur and smoothed with rasp. The wound was trimmed to size for reapproximation subcutaneous tissue reapproximated 2-0 Vicryl and the skin reapproximated with 3-0 nylon sutures. Dry dressing was placed. Patient tolerated procedure well without immediate complication. All counts were correct at completion    Cory Malay C. Donzetta Matters, MD [facility] Office: [phone] Pager: [phone]

## 2016-06-13 NOTE — Progress Notes (Signed)
Pharmacy Antibiotic Note  Cory Weber is a 66 y.o. male admitted on 06/10/2016 with cellulitis/gangrenous toe and possible underlying osteomyelitis.  Pharmacy has been consulted for vancomycin and Zosyn dosing.  Patient is now s/p right TMT amputation.  Patient's SCr is trending up and his vancomycin trough is therapeutic at 19 mcg/mL.  He is afebrile and his WBC is WNL.   Plan: Continue vancomycin 1gm IV Q12H Continue Zosyn 3.375gm IV Q8H, 4 hr infusion Monitor renal fxn, micro data, repeat vanc trough as indicated F/U abx LOT post amputation, BMET in AM   Height: 6\' 1"  (185.4 cm) Weight: 190 lb (86.2 kg) IBW/kg (Calculated) : 79.9  Temp (24hrs), Avg:98.2 F (36.8 C), Min:97.5 F (36.4 C), Max:98.7 F (37.1 C)   Recent Labs Lab 06/10/16 1316 06/10/16 1344 06/11/16 0434 06/12/16 0524 06/13/16 0445 06/13/16 1532  WBC 9.3  --  9.4 9.5 9.7  --   CREATININE 0.75  --  0.89  --  1.24  --   LATICACIDVEN  --  1.32  --   --   --   --   VANCOTROUGH  --   --   --   --   --  19    Estimated Creatinine Clearance: 68.9 mL/min (by C-G formula based on SCr of 1.24 mg/dL).    No Known Allergies   Vanc 4/30 >> Zosyn 4/30 >>  5/3 VT = 19 mcg/mL on 1g q12 >> no change  4/30 BCx: ngtd   [name] D. Mina Marble, PharmD, [place] Pager:  [phone] 06/13/2016, 5:39 PM

## 2016-06-14 ENCOUNTER — Encounter (HOSPITAL_COMMUNITY): Payer: Self-pay | Admitting: Vascular Surgery

## 2016-06-14 DIAGNOSIS — N400 Enlarged prostate without lower urinary tract symptoms: Secondary | ICD-10-CM

## 2016-06-14 LAB — BASIC METABOLIC PANEL
Anion gap: 8 (ref 5–15)
BUN: 13 mg/dL (ref 6–20)
CO2: 25 mmol/L (ref 22–32)
CREATININE: 1.03 mg/dL (ref 0.61–1.24)
Calcium: 8.3 mg/dL — ABNORMAL LOW (ref 8.9–10.3)
Chloride: 97 mmol/L — ABNORMAL LOW (ref 101–111)
GFR calc Af Amer: >60 mL/min (ref >60)
Glucose, Bld: 291 mg/dL — ABNORMAL HIGH (ref 65–99)
POTASSIUM: 4.2 mmol/L (ref 3.5–5.1)
SODIUM: 130 mmol/L — AB (ref 135–145)

## 2016-06-14 LAB — CBC
HEMATOCRIT: 23.7 % — AB (ref 39.0–52.0)
Hemoglobin: 7 g/dL — ABNORMAL LOW (ref 13.0–17.0)
MCH: 24.7 pg — ABNORMAL LOW (ref 26.0–34.0)
MCHC: 29.5 g/dL — ABNORMAL LOW (ref 30.0–36.0)
MCV: 83.7 fL (ref 78.0–100.0)
PLATELETS: 520 10*3/uL — AB (ref 150–400)
RBC: 2.83 MIL/uL — ABNORMAL LOW (ref 4.22–5.81)
RDW: 16.8 % — AB (ref 11.5–15.5)
WBC: 14.5 10*3/uL — AB (ref 4.0–10.5)

## 2016-06-14 LAB — GLUCOSE, CAPILLARY
GLUCOSE-CAPILLARY: 255 mg/dL — AB (ref 65–99)
GLUCOSE-CAPILLARY: 106 mg/dL — AB (ref 65–99)
Glucose-Capillary: 363 mg/dL — ABNORMAL HIGH (ref 65–99)
Glucose-Capillary: 170 mg/dL — ABNORMAL HIGH (ref 65–99)
Glucose-Capillary: 158 mg/dL — ABNORMAL HIGH (ref 65–99)

## 2016-06-14 MED ORDER — HYDROMORPHONE HCL 1 MG/ML IJ SOLN
2.0000 mg | Freq: Once | INTRAMUSCULAR | Status: AC
  Administered 2016-06-14: 2 mg via INTRAVENOUS

## 2016-06-14 MED ORDER — HYDROMORPHONE HCL 1 MG/ML IJ SOLN
2.0000 mg | INTRAMUSCULAR | Status: DC | PRN
  Administered 2016-06-14 – 2016-06-15 (×4): 2 mg via INTRAVENOUS
  Filled 2016-06-14 (×5): qty 2

## 2016-06-14 MED ORDER — KETOROLAC TROMETHAMINE 15 MG/ML IJ SOLN
15.0000 mg | Freq: Once | INTRAMUSCULAR | Status: AC
  Administered 2016-06-14: 15 mg via INTRAVENOUS
  Filled 2016-06-14: qty 1

## 2016-06-14 MED ORDER — KETOROLAC TROMETHAMINE 15 MG/ML IJ SOLN
15.0000 mg | Freq: Four times a day (QID) | INTRAMUSCULAR | Status: DC | PRN
  Administered 2016-06-16 (×2): 15 mg via INTRAVENOUS
  Filled 2016-06-14 (×2): qty 1

## 2016-06-14 MED ORDER — MAGNESIUM CITRATE PO SOLN: 1.0000 | Freq: Every day | ORAL | Status: DC | PRN

## 2016-06-14 NOTE — Progress Notes (Addendum)
Progress Note    Cory Weber  PJA:[id number] DOB: 11-19-1950  DOA: 06/10/2016 PCP: Maggie Font, MD    Brief Narrative:   Chief complaint: Follow-up right foot pain  Medical records reviewed and are as summarized below:  Cory Weber is an 66 y.o. male with a PMH of asthma, BPH, CAD, history of CVA, hepatitis C, hyperlipidemia, hypertension, PVD with left BKA, diabetes, and recent hospitalization 05/31/16-06/04/16 for treatment of ischemia of right foot with gangrene, status post arthrectomy and balloon angioplasty of right SFA with stent, discharged on aspirin and Plavix, who was admitted 06/10/16 for severe right foot pain unrelieved by oral opiates.  Assessment/Plan:   Principal Problem:   Wet gangrene (HCC)/Ischemia of foot Evaluated by vascular surgery. Right ABI is within normal limits at rest, however waveforms suggest this may be falsely elevated possibly due to medial calcification.  Arterial dopplers showed: Mild to moderate smooth heterogenous plaque noted throughout the right lower extremity arteries. Right lower extremity arteries are patent with monophasic flow throughout. The right femoral artery stent appears to be patent. There is evidence of elevated velocities in the distal right femoral artery suggestive of 30-49% stenosis. Remains on empiric Zosyn/vancomycin. Blood cultures negative to date.Underwent a right transmetatarsal amputation 06/13/16. Having severe, uncontrolled pain despite Oxycodone/dilaudid for pain control.  Will increase Dilaudid to 2 mg Q 2 hours PRN and add Toradol 15 mg Q 6 hours PRN. Given intense pain and need for IV pain medications, likely not stable enough for transfer to SNF today.  Active Problems:   Essential hypertension Continue Coreg, Demadex and lisinopril.Currently controlled.    BPH (benign prostatic hyperplasia) Continue Flomax. No reports of urinary retention.    PVD/history of Amputation of left lower extremity below knee  ([place]) Continue aspirin/Plavix.    Uncontrolled type II diabetes with peripheral autonomic neuropathy (HCC) Currently being managed with resistant scale SSI Q AC/HS and 55 units of Lantus daily. CBG range 76-363.    Dyslipidemia associated with type 2 diabetes mellitus (HCC) Continue Zocor.    Physical deconditioning PT/OT. SNF placement.    Anemia, chronic disease Given 1 unit of PRBCs 06/11/16 for hemoglobin of 6.6 mg/dL. Hemoglobin 7.0 mg/dL today, continue to monitor. Transfuse if drops further. Continue iron supplement.    Intractable pain Continue Neurontin, fentanyl, Robaxin, and oxycodone.    Hyponatremia Likely reflective of CHF physiology.    Chronic diastolic CHF (congestive heart failure) ([place]) 2-D echocardiogram done 02/15/16, EF 50-55 percent. Continue torsemide.  HIV screening The patient falls between the ages of 3-64, screened 08/15/15: Non-reactive.   Family Communication/Anticipated D/C date and plan/Code Status   DVT prophylaxis: Heparin ordered. Code Status: Full Code.  Family Communication: No family present at the bedside. Disposition Plan: Will need SNF placement. Social worker consulted.   Medical Consultants:    Vascular Surgery   Procedures:    ABIs  Arterial Dopplers  Anti-Infectives:    None  Subjective:   Right foot pain, rated 10/10. No BMs since admission. No abdominal pain or nausea.Has decided to proceed with foot amputation. Still no BM, but is passing flatus.  No nausea or vomiting.  Objective:    Vitals:   06/13/16 1500 06/13/16 1514 06/13/16 1945 06/14/16 0639  BP: 135/79 138/70 126/67 (!) 151/56  Pulse: 75 77 71 84  Resp: 16 17 16 16   Temp: 98.6 F (37 C) 98.7 F (37.1 C) 98.6 F (37 C) 98.4 F (36.9 C)  TempSrc:  Oral Oral  Oral  SpO2: 100% 100% 100% 99%  Weight:      Height:        Intake/Output Summary (Last 24 hours) at 06/14/16 0800 Last data filed at 06/14/16 0641  Gross per 24 hour  Intake           6326.25 ml  Output             1825 ml  Net          4501.25 ml   Filed Weights   06/10/16 1315  Weight: 86.2 kg (190 lb)    Exam: General exam: Appears restless and uncomfortable secondary to pain.  Respiratory system: Clear to auscultation. Respiratory effort normal. Cardiovascular system: S1 & S2 heard, RRR. No JVD,  rubs, gallops or clicks. No murmurs. Gastrointestinal system: Abdomen is nondistended, soft and nontender. No organomegaly or masses felt. Normal bowel sounds heard. Central nervous system: Alert and oriented. No focal neurological deficits. Extremities: Left BKA with well-healed stump, right foot wrapped in ACE wrap, dressings CDI. Skin: Warm and dry. Psychiatry: Judgement and insight appear normal. Mood & affect appropriate.   Data Reviewed:   I have personally reviewed following labs and imaging studies:  Labs: Basic Metabolic Panel:  Recent Labs Lab 06/10/16 1316 06/11/16 0434 06/13/16 0445 06/14/16 0537  NA 134* 135 130* 130*  K 4.4 3.8 4.1 4.2  CL 102 104 99* 97*  CO2 23 27 23 25   GLUCOSE 254* 180* 214* 291*  BUN 6 7 16 13   CREATININE 0.75 0.89 1.24 1.03  CALCIUM 9.2 8.6* 8.4* 8.3*   GFR Estimated Creatinine Clearance: 83 mL/min (by C-G formula based on SCr of 1.03 mg/dL). Liver Function Tests:  Recent Labs Lab 06/10/16 1316  AST 26  ALT 28  ALKPHOS 72  BILITOT 0.1*  PROT 8.6*  ALBUMIN 3.0*   No results for input(s): LIPASE, AMYLASE in the last 168 hours. No results for input(s): AMMONIA in the last 168 hours. Coagulation profile  Recent Labs Lab 06/10/16 1316  INR 1.12    CBC:  Recent Labs Lab 06/10/16 1316 06/11/16 0434 06/12/16 0524 06/13/16 0445 06/14/16 0537  WBC 9.3 9.4 9.5 9.7 14.5*  NEUTROABS 6.7  --   --   --   --   HGB 8.1* 6.6* 7.9* 7.1* 7.0*  HCT 26.8* 22.2* 25.5* 23.9* 23.7*  MCV 81.7 81.9 82.3 83.6 83.7  PLT 500* 475* 466* 506* 520*   CBG:  Recent Labs Lab 06/13/16 1439 06/13/16 1609  06/13/16 2153 06/14/16 0205 06/14/16 0631  GLUCAP 76 113* 348* 363* 255*   Sepsis Labs:  Recent Labs Lab 06/10/16 1344 06/11/16 0434 06/12/16 0524 06/13/16 0445 06/14/16 0537  WBC  --  9.4 9.5 9.7 14.5*  LATICACIDVEN 1.32  --   --   --   --     Microbiology Recent Results (from the past 240 hour(s))  Culture, blood (Routine x 2)     Status: None (Preliminary result)   Collection Time: 06/10/16  1:30 PM  Result Value Ref Range Status   Specimen Description BLOOD RIGHT WRIST  Final   Special Requests IN PEDIATRIC BOTTLE Blood Culture adequate volume  Final   Culture NO GROWTH 3 DAYS  Final   Report Status PENDING  Incomplete  Culture, blood (Routine x 2)     Status: None (Preliminary result)   Collection Time: 06/10/16  2:40 PM  Result Value Ref Range Status   Specimen Description BLOOD LEFT ANTECUBITAL  Final  Special Requests   Final    BOTTLES DRAWN AEROBIC AND ANAEROBIC Blood Culture adequate volume   Culture NO GROWTH 3 DAYS  Final   Report Status PENDING  Incomplete    Radiology: No results found.  Medications:   . allopurinol  200 mg Oral QPC supper  . aspirin  325 mg Oral Daily  . carvedilol  3.125 mg Oral BID WC  . clopidogrel  75 mg Oral Q breakfast  . fentaNYL (SUBLIMAZE) injection  100 mcg Intravenous Once  . ferrous sulfate  325 mg Oral Daily  . gabapentin  1,200 mg Oral QHS  . gabapentin  600 mg Oral 2 times per day  . heparin  5,000 Units Subcutaneous Q8H  . insulin aspart  0-20 Units Subcutaneous TID WC  . insulin aspart  0-5 Units Subcutaneous QHS  . insulin glargine  55 Units Subcutaneous QHS  . lisinopril  5 mg Oral Daily  . polyethylene glycol  17 g Oral Daily  . senna-docusate  1 tablet Oral BID  . simvastatin  20 mg Oral Daily  . tamsulosin  0.4 mg Oral Daily  . torsemide  20 mg Oral Daily   Continuous Infusions: . sodium chloride 75 mL/hr at 06/13/16 1500  . piperacillin-tazobactam (ZOSYN)  IV 3.375 g (06/14/16 8338)  . vancomycin  1,000 mg (06/14/16 0325)    Medical decision making is of high complexity and this patient is at high risk of deterioration, therefore this is a level 3 visit.  (> 4 problem points, 2 data points, high risk)   LOS: 4 days   RAMA,CHRISTINA  [facility] Pager [phone]. If unable to reach me by pager, please call my cell phone at [phone].  *Please refer to [email or web address], password [email or web address] to get updated schedule on who will round on this patient, as hospitalists switch teams weekly. If 7PM-7AM, please contact night-coverage at [email or web address], password [email or web address] for any overnight needs.  06/14/2016, 8:00 AM

## 2016-06-14 NOTE — Progress Notes (Signed)
Orthopedic Tech Progress Note Patient Details:  Cory Weber Aug 15, 1950 [id number]  Ortho Devices Type of Ortho Device: Postop shoe/boot Ortho Device/Splint Location: rle Ortho Device/Splint Interventions: Application   Basim Bartnik 06/14/2016, 11:21 AM

## 2016-06-14 NOTE — Progress Notes (Signed)
Pt c/o pain this am and wanting to increase dilaudid dose.  I reviewed his pain medication and he has Oxycodone ordered 10mg  q4h prn.  He went 7 hours between doses.   Discussed with pt that Oxy was longer lasting than Dilaudid and encouraged him to ask for it q4h periop until his pain gets better over the next couple of days.  Post op shoe ordered for heal weight bearing only.  Leontine Locket, [facility] 06/14/2016 9:27 AM

## 2016-06-14 NOTE — Progress Notes (Signed)
Physical Therapy Treatment Patient Details Name: Cory Weber MRN: [id number] DOB: [birth year] Today's Date: 06/14/2016    History of Present Illness Cory Weber is a 66 y.o. male with medical history significant of asthma, BPH, CAD, CVA, hepatitis C, hyperlipidemia, hypertension, peripheral vascular disease, diabetes, left BKA. Of note patient was discharged from the hospital  on 05/31/2016 after treatment for right ischemic foot secondary to severe PVD. During admission patient underwent arthrectomy of the right SFA and had a drug-coated balloon angioplasty with stent placement. Patient was started on Plavix and aspirin at that time. Patient states his right foot has been in severe pain since time of his previous admission with no improvement. 06/13/16 R TMA      PT Comments    Pt is making progress towards his goals. Unable to work on sliding board transfers to his wheelchair because he did not have his L BKA prosthetic and he had not been issued R Darco shoe required for transfers. Pt supervision for bed mobility and min guard for anterior/posterior transfer to recliner. Pt requires skilled PT to progress transfers and wheelchair mobility as well as improve strength and endurance to safely navigate in his discharge environment.     Follow Up Recommendations  SNF     Equipment Recommendations  None recommended by PT    Recommendations for Other Services OT consult     Precautions / Restrictions Precautions Precautions: Fall Required Braces or Orthoses: Other Brace/Splint Other Brace/Splint: R darco shoe Restrictions Weight Bearing Restrictions: Yes Other Position/Activity Restrictions: R LE Darco shoe for ambulation weight bearing through R heel only     Mobility  Bed Mobility Overal bed mobility: Needs Assistance Bed Mobility: Supine to Sit     Supine to sit: Supervision     General bed mobility comments: pt able to pull up on rail and power up into sitting with bed  rail required  Transfers Overall transfer level: Needs assistance Equipment used: None Transfers: Comptroller transfers: Min guard   General transfer comment: pt reports not standing with prosthetic L LE in a month time. "i havent used it since i was home last"        Balance Overall balance assessment: Needs assistance Sitting-balance support: No upper extremity supported Sitting balance-Leahy Scale: Good                                      Cognition Arousal/Alertness: Awake/alert Behavior During Therapy: WFL for tasks assessed/performed Overall Cognitive Status: Within Functional Limits for tasks assessed                                           General Comments General comments (skin integrity, edema, etc.): noted to have drainage on bandage R amputation with circle by RN staff      Pertinent Vitals/Pain Pain Assessment: Faces Faces Pain Scale: Hurts little more Pain Location: R foot Pain Descriptors / Indicators: Sore Pain Intervention(s): Monitored during session           PT Goals (current goals can now be found in the care plan section) Acute Rehab PT Goals Patient Stated Goal: To be more independent and eventually try to walk agaqin. PT Goal Formulation: With patient Time For Goal Achievement: 06/25/16 Potential to  Achieve Goals: Good Progress towards PT goals: Progressing toward goals    Frequency    Min 3X/week      PT Plan Current plan remains appropriate    Co-evaluation   Reason for Co-Treatment: Complexity of the patient's impairments (multi-system involvement) PT goals addressed during session: Mobility/safety with mobility;Balance OT goals addressed during session: ADL's and self-care;Proper use of Adaptive equipment and DME;Strengthening/ROM      AM-PAC PT "6 Clicks" Daily Activity  Outcome Measure  Difficulty turning over in bed (including adjusting  bedclothes, sheets and blankets)?: A Little Difficulty moving from lying on back to sitting on the side of the bed? : A Little Difficulty sitting down on and standing up from a chair with arms (e.g., wheelchair, bedside commode, etc,.)?: Total Help needed moving to and from a bed to chair (including a wheelchair)?: Total Help needed walking in hospital room?: Total Help needed climbing 3-5 steps with a railing? : Total 6 Click Score: 10    End of Session   Activity Tolerance: Patient tolerated treatment well Patient left: in chair (in bathroom with OT working on ADLs) Nurse Communication: Mobility status PT Visit Diagnosis: Other abnormalities of gait and mobility (R26.89);Muscle weakness (generalized) (M62.81);Pain Pain - Right/Left: Right Pain - part of body: Ankle and joints of foot     Time: 1040-1048 PT Time Calculation (min) (ACUTE ONLY): 8 min  Charges:  $Therapeutic Activity: 8-22 mins                    G Codes:       Cory Bells B. Migdalia Dk PT, DPT Acute Rehabilitation  [phone] Pager [phone]     [place] 06/14/2016, 1:45 PM

## 2016-06-14 NOTE — Progress Notes (Signed)
  Progress Note    06/14/2016 8:42 AM 1 Day Post-Op  Subjective:  Having mild pain this a.m.  Vitals:   06/13/16 1945 06/14/16 0639  BP: 126/67 (!) 151/56  Pulse: 71 84  Resp: 16 16  Temp: 98.6 F (37 C) 98.4 F (36.9 C)    Physical Exam: aaox3 Non labored respirations Right tma dressing cdi  CBC    Component Value Date/Time   WBC 14.5 (H) 06/14/2016 0537   RBC 2.83 (L) 06/14/2016 0537   HGB 7.0 (L) 06/14/2016 0537   HCT 23.7 (L) 06/14/2016 0537   HCT 26.3 (L) 11/26/2015 1440   HCT 32 10/31/2011 1319   PLT 520 (H) 06/14/2016 0537   MCV 83.7 06/14/2016 0537   MCV 82.8 10/31/2011 1319   MCH 24.7 (L) 06/14/2016 0537   MCHC 29.5 (L) 06/14/2016 0537   RDW 16.8 (H) 06/14/2016 0537   LYMPHSABS 2.1 06/10/2016 1316   MONOABS 0.4 06/10/2016 1316   EOSABS 0.1 06/10/2016 1316   BASOSABS 0.0 06/10/2016 1316    BMET    Component Value Date/Time   NA 130 (L) 06/14/2016 0537   NA 134 (A) 12/27/2015   K 4.2 06/14/2016 0537   K 4.0 10/31/2011 1319   CL 97 (L) 06/14/2016 0537   CO2 25 06/14/2016 0537   GLUCOSE 291 (H) 06/14/2016 0537   BUN 13 06/14/2016 0537   BUN 16 12/27/2015   CREATININE 1.03 06/14/2016 0537   CREATININE 1.06 12/22/2015 1551   CALCIUM 8.3 (L) 06/14/2016 0537   CALCIUM 9.0 10/31/2011 1319   GFRNONAA >60 06/14/2016 0537   GFRAA >60 06/14/2016 0537    INR    Component Value Date/Time   INR 1.12 06/10/2016 1316     Intake/Output Summary (Last 24 hours) at 06/14/16 0842 Last data filed at 06/14/16 0641  Gross per 24 hour  Intake          6326.25 ml  Output             1825 ml  Net          4501.25 ml     Assessment:  66 y.o. male is right sfa stenting and subsequent right tma  Plan: Dressing down tomorrow Can transition off iv abx at discretion of primary team Asa/plavix for recent sfa stenting  Jarvin Ogren C. Donzetta Matters, MD [facility] [place] Office: [phone] Pager: [phone]  06/14/2016 8:42 AM

## 2016-06-14 NOTE — Progress Notes (Signed)
Occupational Therapy Treatment Patient Details Name: Cory Weber MRN: [id number] DOB: 1950-10-01 Today's Date: 06/14/2016    History of present illness Cory Weber is a 66 y.o. male with medical history significant of asthma, BPH, CAD, CVA, hepatitis C, hyperlipidemia, hypertension, peripheral vascular disease, diabetes, left BKA. Of note patient was discharged from the hospital  on 05/31/2016 after treatment for right ischemic foot secondary to severe PVD. During admission patient underwent arthrectomy of the right SFA and had a drug-coated balloon angioplasty with stent placement. Patient was started on Plavix and aspirin at that time. Patient states his right foot has been in severe pain since time of his previous admission with no improvement. 06/13/16 R TMA     OT comments  Pt demonstrates anterior /posterior transfer this session due to lack of L prosthetic and R darco shoe at this time. PT/OT present and OT remained with patient for sink level adl. Pt able to complete grooming task with setup at the sink. Pt d/c IV during peri care and RN notified at this time. Pt requesting therapy to put him in the parallel bars and needed cues of the lack of prosthetic.   Follow Up Recommendations  SNF    Equipment Recommendations  None recommended by OT    Recommendations for Other Services      Precautions / Restrictions Precautions Precautions: Fall       Mobility Bed Mobility Overal bed mobility: Needs Assistance Bed Mobility: Supine to Sit     Supine to sit: Supervision     General bed mobility comments: pt able to pull up on rail and power up into sitting with bed rail required  Transfers                 General transfer comment: pt reports not standing with prosthetic L LE in a month time. "i havent used it since i was home last"    Balance Overall balance assessment: Needs assistance Sitting-balance support: No upper extremity supported Sitting balance-Leahy  Scale: Good                                     ADL either performed or assessed with clinical judgement   ADL Overall ADL's : Needs assistance/impaired Eating/Feeding: Independent   Grooming: Wash/dry hands;Wash/dry face;Oral care;Applying deodorant;Modified independent Grooming Details (indicate cue type and reason): seated in chair Upper Body Bathing: Modified independent Upper Body Bathing Details (indicate cue type and reason): seated in chair Lower Body Bathing: Minimal assistance Lower Body Bathing Details (indicate cue type and reason): pt using lateral leans and posterior pelvic tilt in chair Upper Body Dressing : Modified independent                     General ADL Comments: pt transfered posterior into the chair due to lack of post op shoe presence at this time. Pending Ortho tech arrival.      Vision       Perception     Praxis      Cognition Arousal/Alertness: Awake/alert Behavior During Therapy: [facility] for tasks assessed/performed Overall Cognitive Status: Within Functional Limits for tasks assessed                                          Exercises  Shoulder Instructions       General Comments noted to have drainage on bandage R amputation with circle by RN staff    Pertinent Vitals/ Pain       Pain Assessment: Faces Faces Pain Scale: Hurts little more Pain Location: R foot Pain Descriptors / Indicators: Sore Pain Intervention(s): Monitored during session;Premedicated before session;Repositioned  Home Living                                          Prior Functioning/Environment              Frequency  Min 2X/week        Progress Toward Goals  OT Goals(current goals can now be found in the care plan section)  Progress towards OT goals: Progressing toward goals  Acute Rehab OT Goals Patient Stated Goal: To be more independent and eventually try to walk agaqin. OT Goal  Formulation: With patient Time For Goal Achievement: 06/26/16 Potential to Achieve Goals: Good ADL Goals Pt Will Perform Lower Body Bathing: with min assist;sit to/from stand Pt Will Perform Lower Body Dressing: with min assist;sit to/from stand Pt Will Transfer to Toilet: with min assist;stand pivot transfer;bedside commode Pt Will Perform Toileting - Clothing Manipulation and hygiene: sit to/from stand;with min guard assist  Plan Discharge plan remains appropriate    Co-evaluation    PT/OT/SLP Co-Evaluation/Treatment: Yes Reason for Co-Treatment: Complexity of the patient's impairments (multi-system involvement);Necessary to address cognition/behavior during functional activity;For patient/therapist safety;To address functional/ADL transfers   OT goals addressed during session: ADL's and self-care;Proper use of Adaptive equipment and DME;Strengthening/ROM      AM-PAC PT "6 Clicks" Daily Activity     Outcome Measure   Help from another person eating meals?: None Help from another person taking care of personal grooming?: None Help from another person toileting, which includes using toliet, bedpan, or urinal?: A Little Help from another person bathing (including washing, rinsing, drying)?: A Little Help from another person to put on and taking off regular upper body clothing?: A Little Help from another person to put on and taking off regular lower body clothing?: A Lot 6 Click Score: 19    End of Session    OT Visit Diagnosis: Unsteadiness on feet (R26.81) Pain - Right/Left: Right   Activity Tolerance Patient tolerated treatment well   Patient Left in chair;with call bell/phone within reach   Nurse Communication Mobility status;Precautions        Time: 1031-1105 OT Time Calculation (min): 34 min  Charges: OT General Charges $OT Visit: 1 Procedure OT Treatments $Self Care/Home Management : 8-22 mins   Jeri Modena   OTR/L Pager: [phone] Office:  [phone] .    Parke Poisson B 06/14/2016, 12:02 PM

## 2016-06-15 DIAGNOSIS — K5903 Drug induced constipation: Secondary | ICD-10-CM

## 2016-06-15 DIAGNOSIS — R5082 Postprocedural fever: Secondary | ICD-10-CM | POA: Diagnosis not present

## 2016-06-15 DIAGNOSIS — T402X5A Adverse effect of other opioids, initial encounter: Secondary | ICD-10-CM | POA: Diagnosis present

## 2016-06-15 LAB — CBC
HCT: 24.7 % — ABNORMAL LOW (ref 39.0–52.0)
Hemoglobin: 7.4 g/dL — ABNORMAL LOW (ref 13.0–17.0)
MCH: 25 pg — ABNORMAL LOW (ref 26.0–34.0)
MCHC: 30 g/dL (ref 30.0–36.0)
MCV: 83.4 fL (ref 78.0–100.0)
Platelets: 536 10*3/uL — ABNORMAL HIGH (ref 150–400)
RBC: 2.96 MIL/uL — ABNORMAL LOW (ref 4.22–5.81)
RDW: 17.2 % — AB (ref 11.5–15.5)
WBC: 15.4 10*3/uL — ABNORMAL HIGH (ref 4.0–10.5)

## 2016-06-15 LAB — GLUCOSE, CAPILLARY
GLUCOSE-CAPILLARY: 114 mg/dL — AB (ref 65–99)
GLUCOSE-CAPILLARY: 203 mg/dL — AB (ref 65–99)
Glucose-Capillary: 175 mg/dL — ABNORMAL HIGH (ref 65–99)
Glucose-Capillary: 184 mg/dL — ABNORMAL HIGH (ref 65–99)

## 2016-06-15 LAB — CULTURE, BLOOD (ROUTINE X 2)
CULTURE: NO GROWTH
Culture: NO GROWTH
Special Requests: ADEQUATE
Special Requests: ADEQUATE

## 2016-06-15 MED ORDER — HYDROMORPHONE HCL 1 MG/ML IJ SOLN
1.5000 mg | INTRAMUSCULAR | Status: DC | PRN
  Administered 2016-06-15 – 2016-06-17 (×9): 1.5 mg via INTRAVENOUS
  Filled 2016-06-15 (×9): qty 2

## 2016-06-15 MED ORDER — OXYCODONE HCL 5 MG PO TABS
5.0000 mg | ORAL_TABLET | ORAL | Status: DC | PRN
  Administered 2016-06-16 – 2016-06-17 (×5): 10 mg via ORAL
  Filled 2016-06-15 (×5): qty 2

## 2016-06-15 NOTE — Clinical Social Work Note (Signed)
Clinical Social Work Assessment  Patient Details  Name: Cory Weber MRN: [id number] Date of Birth: 1950-12-11  Date of referral:  06/15/16               Reason for consult:  Discharge Planning                Permission sought to share information with:  Family Supports Permission granted to share information::  Yes, Verbal Permission Granted  Name::     Geannie Risen  Agency::     Relationship::  sister  Contact Information:  [phone]  Housing/Transportation Living arrangements for the past 2 months:  Single Family Home Source of Information:  Patient Patient Interpreter Needed:  None Criminal Activity/Legal Involvement Pertinent to Current Situation/Hospitalization:  No - Comment as needed Significant Relationships:  Other Family Members, Siblings Lives with:  Self Do you feel safe going back to the place where you live?  Yes Need for family participation in patient care:  No (Coment)  Care giving concerns:  patient lives at home by himself  Social Worker assessment / plan:  Clinical Social Worker met patient at bedside to offer support and discuss discharge options. Patient lives alone and does not have family support. Patient stated he has a sister but she is older. Patient is agreeable to go to SNF. Patient aware that because of his insurance he would have to stay at a SNF for no less than 30 days. Patient also aware that part of him staying at SNF he would have to relinquished his disability check to the facility. Patient is in agreement with condition, stating he really needs help and rehab would give him that. CSW to complete necessary paperwork and initiate SNF search on patient behalf. CSW to follow up with patient once bed offers are available. CSW remains available for support and discharge needs   Employment status:  Disabled (Comment on whether or not currently receiving Disability) Insurance information:  Medicaid In Short PT Recommendations:  [place] / Referral to community resources:  [place]  Patient/Family's Response to care:  Patient verbalized appreciation and understanding for CSW role and involvement in care. Patient agreeable with current discharge plan to SNF  Patient/Family's Understanding of and Emotional Response to Diagnosis, Current Treatment, and Prognosis:  Patient with good understanding of current medical state and limitations around most recent hospitalization.  Emotional Assessment Appearance:  Appears stated age Attitude/Demeanor/Rapport:  Other Affect (typically observed):  Pleasant Orientation:  Oriented to Situation, Oriented to  Time, Oriented to Place, Oriented to Self Alcohol / Substance use:  Alcohol Use, Illicit Drugs (last used cocaine in 2017, stop drinking 3-4 yr ago) Psych involvement (Current and /or in the community):  No (Comment)  Discharge Needs  Concerns to be addressed:  No discharge needs identified Readmission within the last 30 days:  No Current discharge risk:  None Barriers to Discharge:  No Barriers Identified   Wende Neighbors, LCSW 06/15/2016, 12:00 PM

## 2016-06-15 NOTE — NC FL2 (Signed)
[place] LEVEL OF CARE SCREENING TOOL     IDENTIFICATION  Patient Name: Cory Weber Birthdate: 01/11/1951 Sex: male Admission Date (Current Location): 06/10/2016  [facility] and [place] Number:  Herbalist and Address:  [facility] [address] [place], [place] [address]      Provider Number: [id number]  Attending Physician Name and Address:  Rama, Venetia Maxon, MD  Relative Name and Phone Number:       Current Level of Care: Hospital Recommended Level of Care: [place] Prior Approval Number:    Date Approved/Denied: 06/13/16 PASRR Number: [id number]  Discharge Plan: SNF    Current Diagnoses: Patient Active Problem List   Diagnosis Date Noted  . Postoperative fever 06/15/2016  . Constipation due to opioid therapy 06/15/2016  . Physical deconditioning 06/10/2016  . Wet gangrene ([place]) 06/10/2016  . Anemia, chronic disease 06/10/2016  . Intractable pain 06/10/2016  . Chronic diastolic CHF (congestive heart failure) ([place]) 06/10/2016  . Gangrene ([place]) 06/10/2016  . Ischemic pain of right foot 06/04/2016  . Ischemia of foot 05/31/2016  . 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 Dyslipidemia associated with type 2 diabetes mellitus ([place]) 01/21/2016  . Uncontrolled type II diabetes with peripheral autonomic neuropathy ([place]) 01/17/2016  . Weight gain 12/20/2015  . Edema of right lower extremity 12/20/2015  . Cough 11/26/2015  . GERD (gastroesophageal reflux disease) 10/08/2015  . Amputation of left lower extremity below knee ([place]) 08/21/2015  . Phantom limb pain ([place])   . Anemia of chronic disease   . BPH (benign prostatic hyperplasia)   . ETOH abuse   . PVD (peripheral vascular disease) ([place])   . Chronic hepatitis C without hepatic coma ([place])   . History of CVA (cerebrovascular accident) without residual deficits   . Hyponatremia   .  Atherosclerosis of left lower extremity with ulceration of midfoot ([place])   . Chronic kidney disease, stage 3 07/18/2015  . Essential hypertension 07/18/2015  . S/P lumbar spinal fusion 11/25/2013  . Melena 12/03/2011  . Hemiparesthesia 02/03/2011  . Polysubstance abuse 02/03/2011  . Tobacco abuse 02/03/2011    Orientation RESPIRATION BLADDER Height & Weight     Self, Time, Situation, Place  Normal Continent Weight: 190 lb (86.2 kg) Height:  6\' 1"  (185.4 cm)  BEHAVIORAL SYMPTOMS/MOOD NEUROLOGICAL BOWEL NUTRITION STATUS      Continent Diet (See DC Summary)  AMBULATORY STATUS COMMUNICATION OF NEEDS Skin   Limited Assist Verbally Normal                       Personal Care Assistance Level of Assistance  Bathing, Feeding, Dressing Bathing Assistance: Limited assistance Feeding assistance: Independent Dressing Assistance: Limited assistance     Functional Limitations Info  Sight, Hearing, Speech Sight Info: Adequate Hearing Info: Adequate Speech Info: Adequate    SPECIAL CARE FACTORS FREQUENCY  PT (By licensed PT), OT (By licensed OT)     PT Frequency: 5x week OT Frequency: 5x week            Contractures Contractures Info: Not present    Additional Factors Info  Code Status, Allergies Code Status Info: Full Allergies Info: NKA   Insulin Sliding Scale Info: 20 units 3x's a day; 5 units at bed; Lantus 55 units daily       Current Medications (06/15/2016):  This is the current hospital active medication list Current  Facility-Administered Medications  Medication Dose Route Frequency Provider Last Rate Last Dose  . 0.9 %  sodium chloride infusion   Intravenous Continuous Waldemar Dickens, MD 75 mL/hr at 06/13/16 1500    . acetaminophen (TYLENOL) tablet 1,000 mg  1,000 mg Oral Q6H PRN Waldemar Dickens, MD   1,000 mg at 06/15/16 1440   Or  . acetaminophen (TYLENOL) suppository 650 mg  650 mg Rectal Q6H PRN Waldemar Dickens, MD      . allopurinol (ZYLOPRIM) tablet  200 mg  200 mg Oral QPC supper Waldemar Dickens, MD   200 mg at 06/14/16 1731  . aspirin tablet 325 mg  325 mg Oral Daily Waldemar Dickens, MD   325 mg at 06/15/16 0973  . carvedilol (COREG) tablet 3.125 mg  3.125 mg Oral BID WC Waldemar Dickens, MD   3.125 mg at 06/15/16 0759  . clopidogrel (PLAVIX) tablet 75 mg  75 mg Oral Q breakfast Waldemar Dickens, MD   75 mg at 06/15/16 0759  . fentaNYL (SUBLIMAZE) injection 100 mcg  100 mcg Intravenous Once Duane Boston, MD      . ferrous sulfate tablet 325 mg  325 mg Oral Daily Waldemar Dickens, MD   325 mg at 06/15/16 5329  . gabapentin (NEURONTIN) tablet 1,200 mg  1,200 mg Oral QHS Waldemar Dickens, MD   1,200 mg at 06/14/16 2107  . gabapentin (NEURONTIN) tablet 600 mg  600 mg Oral 2 times per day Waldemar Dickens, MD   600 mg at 06/15/16 0956  . heparin injection 5,000 Units  5,000 Units Subcutaneous Q8H RhyneHulen Shouts, PA-C   5,000 Units at 06/15/16 1355  . HYDROmorphone (DILAUDID) injection 1.5 mg  1.5 mg Intravenous Q2H PRN Rama, Venetia Maxon, MD      . insulin aspart (novoLOG) injection 0-20 Units  0-20 Units Subcutaneous TID WC Waldemar Dickens, MD   4 Units at 06/15/16 1217  . insulin aspart (novoLOG) injection 0-5 Units  0-5 Units Subcutaneous QHS Waldemar Dickens, MD   5 Units at 06/14/16 0217  . insulin glargine (LANTUS) injection 55 Units  55 Units Subcutaneous QHS Waldemar Dickens, MD   55 Units at 06/14/16 2109  . ketorolac (TORADOL) 15 MG/ML injection 15 mg  15 mg Intravenous Q6H PRN Rama, Venetia Maxon, MD      . lisinopril (PRINIVIL,ZESTRIL) tablet 5 mg  5 mg Oral Daily Waldemar Dickens, MD   5 mg at 06/15/16 0953  . magnesium citrate solution 1 Bottle  1 Bottle Oral Daily PRN Rama, Venetia Maxon, MD      . methocarbamol (ROBAXIN) tablet 500 mg  500 mg Oral Q6H PRN Waldemar Dickens, MD   500 mg at 06/14/16 1005  . ondansetron (ZOFRAN) tablet 4 mg  4 mg Oral Q6H PRN Waldemar Dickens, MD       Or  . ondansetron [facility]) injection 4 mg  4 mg  Intravenous Q6H PRN Waldemar Dickens, MD      . oxyCODONE (Oxy IR/ROXICODONE) immediate release tablet 5-10 mg  5-10 mg Oral Q4H PRN Rama, Venetia Maxon, MD      . piperacillin-tazobactam (ZOSYN) IVPB 3.375 g  3.375 g Intravenous Q8H Rumbarger, Rachel L, RPH 12.5 mL/hr at 06/15/16 1352 3.375 g at 06/15/16 1352  . polyethylene glycol (MIRALAX / GLYCOLAX) packet 17 g  17 g Oral Daily Rama, Venetia Maxon, MD   17 g at 06/15/16 0952  .  senna-docusate (Senokot-S) tablet 1 tablet  1 tablet Oral BID Rama, Venetia Maxon, MD   1 tablet at 06/15/16 0953  . simvastatin (ZOCOR) tablet 20 mg  20 mg Oral Daily Waldemar Dickens, MD   20 mg at 06/15/16 8768  . tamsulosin (FLOMAX) capsule 0.4 mg  0.4 mg Oral Daily Waldemar Dickens, MD   0.4 mg at 06/15/16 1157  . torsemide (DEMADEX) tablet 20 mg  20 mg Oral Daily Waldemar Dickens, MD   20 mg at 06/15/16 2620  . vancomycin (VANCOCIN) IVPB 1000 mg/200 mL premix  1,000 mg Intravenous Q12H Rumbarger, Valeda Malm, [facility]   Stopped at 06/15/16 0502     Discharge Medications: Please see discharge summary for a list of discharge medications.  Relevant Imaging Results:  Relevant Lab Results:   Additional Information [id number]  Wende Neighbors, LCSW

## 2016-06-15 NOTE — Progress Notes (Addendum)
[facility] [place]  Subjective  - feels a little better   Objective (!) 168/85 (!) 110 (!) 101 F (38.3 C) (Oral) 17 97%  Intake/Output Summary (Last 24 hours) at 06/15/16 0910 Last data filed at 06/15/16 0800  Gross per 24 hour  Intake          4383.75 ml  Output             1200 ml  Net          3183.75 ml   Right foot warm TMA healing but tenuous, still some edema  Assessment/Planning: s/p right SFA stent and TMA Still requiring intermittent IV pain meds Leukocytosis antibiotics per primary team, currently on Zosyn and Vanc Hopefully SNF next week  Ruta Hinds 06/15/2016 9:10 AM --  Laboratory Lab Results:  Recent Labs  06/14/16 0537 06/15/16 0717  WBC 14.5* 15.4*  HGB 7.0* 7.4*  HCT 23.7* 24.7*  PLT 520* 536*   BMET  Recent Labs  06/13/16 0445 06/14/16 0537  NA 130* 130*  K 4.1 4.2  CL 99* 97*  CO2 23 25  GLUCOSE 214* 291*  BUN 16 13  CREATININE 1.24 1.03  CALCIUM 8.4* 8.3*    COAG Lab Results  Component Value Date   INR 1.12 06/10/2016   INR 1.17 05/31/2016   INR 1.2 (H) 10/19/2015   No results found for: PTT

## 2016-06-15 NOTE — Progress Notes (Signed)
Progress Note    Cory Weber  MLJ:[id number] DOB: 1950-04-13  DOA: 06/10/2016 PCP: Iona Beard, MD    Brief Narrative:   Chief complaint: Follow-up right foot pain  Medical records reviewed and are as summarized below:  Cory Weber is an 66 y.o. male with a PMH of asthma, BPH, CAD, history of CVA, hepatitis C, hyperlipidemia, hypertension, PVD with left BKA, diabetes, and recent hospitalization 05/31/16-06/04/16 for treatment of ischemia of right foot with gangrene, status post arthrectomy and balloon angioplasty of right SFA with stent, discharged on aspirin and Plavix, who was admitted 06/10/16 for severe right foot pain unrelieved by oral opiates.Patient was subsequently evaluated by vascular surgery and ultimately underwent a right transmetatarsal amputation on 06/13/16.  Assessment/Plan:   Principal Problem:   Wet gangrene (HCC)/Ischemia of foot Evaluated by vascular surgery. ABIs/arterial Dopplers obtained with results documented below. Underwent a right transmetatarsal amputation 06/13/16. Remains on empiric Zosyn/vancomycin. Blood cultures negative to date.Was unable to discharge to SNF 06/14/16 due to severe pain and need for ongoing IV Dilaudid for pain control. Continues to require IV pain medicine for pain, and with fever, leukocytosis, not yet ready for discharge.  Active Problems:   Opiate-induced constipation Bowel regimen in place.    Postoperative Fever Temperature 101 noted today. WBC 15.4. Monitor closely for infection. Blood cultures negative. Continue empiric vancomycin and Zosyn for now.    Essential hypertension Continue Coreg, Demadex and lisinopril.Currently controlled.    BPH (benign prostatic hyperplasia) Continue Flomax. No reports of urinary retention.    PVD/history of Amputation of left lower extremity below knee ([place]) Continue aspirin/Plavix.    Uncontrolled type II diabetes with peripheral autonomic neuropathy (HCC) Currently being managed  with resistant scale SSI Q AC/HS and 55 units of Lantus daily. CBG range 106-255.    Dyslipidemia associated with type 2 diabetes mellitus (HCC) Continue Zocor.    Physical deconditioning Continue PT/OT. Anticipate SNF placement in the next 48 hours.    Anemia, chronic disease Given 1 unit of PRBCs 06/11/16 for hemoglobin of 6.6 mg/dL. Hemoglobin 7.4 mg/dL today, continue to monitor. Continue iron supplement.    Intractable pain Continue Neurontin, Dilaudid, Robaxin, and oxycodone.    Hyponatremia Likely reflective of CHF physiology. Stable.    Chronic diastolic CHF (congestive heart failure) ([place]) 2-D echocardiogram done 02/15/16, EF 50-55 percent. Continue torsemide.  HIV screening The patient falls between the ages of 59-64, screened 08/15/15: Non-reactive.   Family Communication/Anticipated D/C date and plan/Code Status   DVT prophylaxis: Heparin ordered. Code Status: Full Code.  Family Communication: No family present at the bedside. Disposition Plan: Will need SNF placement. Social worker consulted.   Medical Consultants:    Vascular Surgery   Procedures:   06/12/16: ABIs/arterial Dopplers:Right ABI is within normal limits at rest, however waveforms suggest this may be falsely elevated possibly due to medial calcification.  Arterial dopplers showed: Mild to moderate smooth heterogenous plaque noted throughout the right lower extremity arteries. Right lower extremity arteries are patent with monophasic flow throughout. The right femoral artery stent appears to be patent. There is evidence of elevated velocities in the distal right femoral artery suggestive of 30-49% stenosis.   Anti-Infectives:    Vancomycin 06/10/16--->  Zosyn 06/10/16--->  Subjective:   Right foot pain, rated 10/10, exacerbated by surgeon removing bandage this morning. Still has not had a BM since admission. No nausea or vomiting. No dyspnea.   Objective:    Vitals:   06/14/16 1219 06/14/16 1521  06/14/16 2142 06/15/16 0602  BP: (!) 151/56 122/68 136/63 (!) 168/85  Pulse: 84 79 97 (!) 110  Resp: 16 17    Temp: 98.4 F (36.9 C) 98.7 F (37.1 C) 98.7 F (37.1 C) (!) 101 F (38.3 C)  TempSrc: Oral Oral Oral Oral  SpO2: 99%  95% 97%  Weight:      Height:        Intake/Output Summary (Last 24 hours) at 06/15/16 0837 Last data filed at 06/15/16 0800  Gross per 24 hour  Intake          4383.75 ml  Output             1200 ml  Net          3183.75 ml   Filed Weights   06/10/16 1315  Weight: 86.2 kg (190 lb)    Exam: General exam: MIldly restless, but fell asleep when I was examining him.  Respiratory system: Clear to auscultation. Respiratory effort normal. Cardiovascular system: S1 & S2 heard, RRR. No JVD,  rubs, gallops or clicks. II/VI systolic murmur. Gastrointestinal system: Abdomen is nondistended, soft and nontender. No organomegaly or masses felt. Normal bowel sounds heard. Central nervous system: Drowsy. No focal neurological deficits. Extremities: Left BKA with well-healed stump, right foot wrapped in ACE wrap, dressings CDI. Skin: Warm and dry. Psychiatry: Judgement and insight appear normal. Mood & affect appropriate.   Data Reviewed:   I have personally reviewed following labs and imaging studies:  Labs: Basic Metabolic Panel:  Recent Labs Lab 06/10/16 1316 06/11/16 0434 06/13/16 0445 06/14/16 0537  NA 134* 135 130* 130*  K 4.4 3.8 4.1 4.2  CL 102 104 99* 97*  CO2 23 27 23 25   GLUCOSE 254* 180* 214* 291*  BUN 6 7 16 13   CREATININE 0.75 0.89 1.24 1.03  CALCIUM 9.2 8.6* 8.4* 8.3*   GFR Estimated Creatinine Clearance: 83 mL/min (by C-G formula based on SCr of 1.03 mg/dL). Liver Function Tests:  Recent Labs Lab 06/10/16 1316  AST 26  ALT 28  ALKPHOS 72  BILITOT 0.1*  PROT 8.6*  ALBUMIN 3.0*   No results for input(s): LIPASE, AMYLASE in the last 168 hours. No results for input(s): AMMONIA in the last 168 hours. Coagulation  profile  Recent Labs Lab 06/10/16 1316  INR 1.12    CBC:  Recent Labs Lab 06/10/16 1316 06/11/16 0434 06/12/16 0524 06/13/16 0445 06/14/16 0537 06/15/16 0717  WBC 9.3 9.4 9.5 9.7 14.5* 15.4*  NEUTROABS 6.7  --   --   --   --   --   HGB 8.1* 6.6* 7.9* 7.1* 7.0* 7.4*  HCT 26.8* 22.2* 25.5* 23.9* 23.7* 24.7*  MCV 81.7 81.9 82.3 83.6 83.7 83.4  PLT 500* 475* 466* 506* 520* 536*   CBG:  Recent Labs Lab 06/14/16 0631 06/14/16 1135 06/14/16 1625 06/14/16 2146 06/15/16 0606  GLUCAP 255* 106* 170* 158* 114*   Sepsis Labs:  Recent Labs Lab 06/10/16 1344  06/12/16 0524 06/13/16 0445 06/14/16 0537 06/15/16 0717  WBC  --   < > 9.5 9.7 14.5* 15.4*  LATICACIDVEN 1.32  --   --   --   --   --   < > = values in this interval not displayed.  Microbiology Recent Results (from the past 240 hour(s))  Culture, blood (Routine x 2)     Status: None (Preliminary result)   Collection Time: 06/10/16  1:30 PM  Result Value Ref Range Status  Specimen Description BLOOD RIGHT WRIST  Final   Special Requests IN PEDIATRIC BOTTLE Blood Culture adequate volume  Final   Culture NO GROWTH 4 DAYS  Final   Report Status PENDING  Incomplete  Culture, blood (Routine x 2)     Status: None (Preliminary result)   Collection Time: 06/10/16  2:40 PM  Result Value Ref Range Status   Specimen Description BLOOD LEFT ANTECUBITAL  Final   Special Requests   Final    BOTTLES DRAWN AEROBIC AND ANAEROBIC Blood Culture adequate volume   Culture NO GROWTH 4 DAYS  Final   Report Status PENDING  Incomplete    Radiology: No results found.  Medications:   . allopurinol  200 mg Oral QPC supper  . aspirin  325 mg Oral Daily  . carvedilol  3.125 mg Oral BID WC  . clopidogrel  75 mg Oral Q breakfast  . fentaNYL (SUBLIMAZE) injection  100 mcg Intravenous Once  . ferrous sulfate  325 mg Oral Daily  . gabapentin  1,200 mg Oral QHS  . gabapentin  600 mg Oral 2 times per day  . heparin  5,000 Units  Subcutaneous Q8H  . insulin aspart  0-20 Units Subcutaneous TID WC  . insulin aspart  0-5 Units Subcutaneous QHS  . insulin glargine  55 Units Subcutaneous QHS  . lisinopril  5 mg Oral Daily  . polyethylene glycol  17 g Oral Daily  . senna-docusate  1 tablet Oral BID  . simvastatin  20 mg Oral Daily  . tamsulosin  0.4 mg Oral Daily  . torsemide  20 mg Oral Daily   Continuous Infusions: . sodium chloride 75 mL/hr at 06/13/16 1500  . piperacillin-tazobactam (ZOSYN)  IV 3.375 g (06/15/16 0550)  . vancomycin Stopped (06/15/16 0502)    Medical decision making is of high complexity and this patient is at high risk of deterioration, therefore this is a level 3 visit.  (> 4 problem points, 2 data points, high risk)   LOS: 5 days   Geral Coker  [facility] Pager [phone]. If unable to reach me by pager, please call my cell phone at [phone].  *Please refer to [email or web address], password [email or web address] to get updated schedule on who will round on this patient, as hospitalists switch teams weekly. If 7PM-7AM, please contact night-coverage at [email or web address], password [email or web address] for any overnight needs.  06/15/2016, 8:37 AM

## 2016-06-16 ENCOUNTER — Encounter (HOSPITAL_COMMUNITY): Payer: Self-pay | Admitting: *Deleted

## 2016-06-16 LAB — BASIC METABOLIC PANEL
Anion gap: 9 (ref 5–15)
BUN: 11 mg/dL (ref 6–20)
CALCIUM: 8.5 mg/dL — AB (ref 8.9–10.3)
CO2: 27 mmol/L (ref 22–32)
Chloride: 96 mmol/L — ABNORMAL LOW (ref 101–111)
Creatinine, Ser: 1.08 mg/dL (ref 0.61–1.24)
GFR calc Af Amer: >60 mL/min (ref >60)
Glucose, Bld: 61 mg/dL — ABNORMAL LOW (ref 65–99)
POTASSIUM: 3.6 mmol/L (ref 3.5–5.1)
Sodium: 132 mmol/L — ABNORMAL LOW (ref 135–145)

## 2016-06-16 LAB — GLUCOSE, CAPILLARY
GLUCOSE-CAPILLARY: 53 mg/dL — AB (ref 65–99)
GLUCOSE-CAPILLARY: 114 mg/dL — AB (ref 65–99)
GLUCOSE-CAPILLARY: 84 mg/dL (ref 65–99)
GLUCOSE-CAPILLARY: 122 mg/dL — AB (ref 65–99)
Glucose-Capillary: 102 mg/dL — ABNORMAL HIGH (ref 65–99)

## 2016-06-16 LAB — CBC
HCT: 23.4 % — ABNORMAL LOW (ref 39.0–52.0)
HEMOGLOBIN: 7.1 g/dL — AB (ref 13.0–17.0)
MCH: 25.3 pg — ABNORMAL LOW (ref 26.0–34.0)
MCHC: 30.3 g/dL (ref 30.0–36.0)
MCV: 83.3 fL (ref 78.0–100.0)
Platelets: 494 10*3/uL — ABNORMAL HIGH (ref 150–400)
RBC: 2.81 MIL/uL — AB (ref 4.22–5.81)
RDW: 17.1 % — ABNORMAL HIGH (ref 11.5–15.5)
WBC: 15.8 10*3/uL — ABNORMAL HIGH (ref 4.0–10.5)

## 2016-06-16 MED ORDER — INSULIN GLARGINE 100 UNIT/ML ~~LOC~~ SOLN
50.0000 [IU] | Freq: Every day | SUBCUTANEOUS | Status: DC
  Administered 2016-06-16: 50 [IU] via SUBCUTANEOUS
  Filled 2016-06-16: qty 0.5

## 2016-06-16 NOTE — Progress Notes (Signed)
CBG has come up to 114.

## 2016-06-16 NOTE — Progress Notes (Signed)
[facility]  Subjective  - foot feel better  Objective 101/83 (!) 101 98.4 F (36.9 C) (Oral) 15 95%  Intake/Output Summary (Last 24 hours) at 06/16/16 0859 Last data filed at 06/15/16 1700  Gross per 24 hour  Intake             3011 ml  Output                0 ml  Net             3011 ml   Groin no hematoma Foot dressing intact without drainage  Assessment/Planning: Awaiting SNF Ok for d/c from our standpoint  Ruta Hinds 06/16/2016 8:59 AM --  Laboratory Lab Results:  Recent Labs  06/15/16 0717 06/16/16 0554  WBC 15.4* 15.8*  HGB 7.4* 7.1*  HCT 24.7* 23.4*  PLT 536* 494*   BMET  Recent Labs  06/14/16 0537 06/16/16 0554  NA 130* 132*  K 4.2 3.6  CL 97* 96*  CO2 25 27  GLUCOSE 291* 61*  BUN 13 11  CREATININE 1.03 1.08  CALCIUM 8.3* 8.5*    COAG Lab Results  Component Value Date   INR 1.12 06/10/2016   INR 1.17 05/31/2016   INR 1.2 (H) 10/19/2015   No results found for: PTT

## 2016-06-16 NOTE — Progress Notes (Signed)
Morning CBG 53; pt. Given 4 oz of orange juice with crackers and peanut butter.  Will re-check cbg. Patient asymptomatic.

## 2016-06-16 NOTE — Progress Notes (Signed)
Progress Note    Cory Weber  NLG:[id number] DOB: [birth year]  DOA: 06/10/2016 PCP: Iona Beard, MD    Brief Narrative:   Chief complaint: Follow-up right foot pain  Medical records reviewed and are as summarized below:  Cory Weber is an 66 y.o. male with a PMH of asthma, BPH, CAD, history of CVA, hepatitis C, hyperlipidemia, hypertension, PVD with left BKA, diabetes, and recent hospitalization 05/31/16-06/04/16 for treatment of ischemia of right foot with gangrene, status post arthrectomy and balloon angioplasty of right SFA with stent, discharged on aspirin and Plavix, who was admitted 06/10/16 for severe right foot pain unrelieved by oral opiates.Patient was subsequently evaluated by vascular surgery and ultimately underwent a right transmetatarsal amputation on 06/13/16.  Assessment/Plan:   Principal Problem:   Wet gangrene (HCC)/Ischemia of foot Evaluated by vascular surgery. ABIs/arterial Dopplers obtained with results documented below. Underwent a right transmetatarsal amputation 06/13/16. Remains on empiric Zosyn/vancomycin. Blood cultures negative to date.Was unable to discharge to SNF 06/14/16 due to severe pain and need for ongoing IV Dilaudid for pain control. Continues to require IV pain medicine for pain, and with fever, leukocytosis, not yet ready for discharge. Will watch another 24 hours, and if fever curve down, d/c to SNF 06/17/16.  Active Problems:   Opiate-induced constipation Bowel regimen in place. Bowels moved this morning.    Postoperative Fever Continues to be febrile. WBC 15.8.  Blood cultures negative. Continue empiric vancomycin and Zosyn for now.    Essential hypertension Continue Coreg, Demadex and lisinopril.Currently controlled.    BPH (benign prostatic hyperplasia) Continue Flomax. No reports of urinary retention.    PVD/history of Amputation of left lower extremity below knee ([place]) Continue aspirin/Plavix.    Uncontrolled type II diabetes  with peripheral autonomic neuropathy (HCC) Currently being managed with resistant scale SSI Q AC/HS and 55 units of Lantus daily. CBG range 53-203. Decrease Lantus to 50 units daily given hypoglycemic episode, and discontinue at bedtime coverage.    Dyslipidemia associated with type 2 diabetes mellitus (HCC) Continue Zocor.    Physical deconditioning Continue PT/OT. Anticipate SNF placement in the next 24 hours.    Anemia, chronic disease Given 1 unit of PRBCs 06/11/16 for hemoglobin of 6.6 mg/dL. Hemoglobin 7.1 mg/dL today, continue to monitor. Continue iron supplement.    Intractable pain Continue Neurontin, Dilaudid, Robaxin, and oxycodone.    Hyponatremia Likely reflective of CHF physiology. 132 today, improving.    Chronic diastolic CHF (congestive heart failure) ([place]) 2-D echocardiogram done 02/15/16, EF 50-55 percent. Continue torsemide.  HIV screening The patient falls between the ages of 52-64, screened 08/15/15: Non-reactive.   Family Communication/Anticipated D/C date and plan/Code Status   DVT prophylaxis: Heparin ordered. Code Status: Full Code.  Family Communication: No family present at the bedside. Disposition Plan: Will need SNF placement. Social worker consulted.   Medical Consultants:    Vascular Surgery   Procedures:   06/12/16: ABIs/arterial Dopplers:Right ABI is within normal limits at rest, however waveforms suggest this may be falsely elevated possibly due to medial calcification.  Arterial dopplers showed: Mild to moderate smooth heterogenous plaque noted throughout the right lower extremity arteries. Right lower extremity arteries are patent with monophasic flow throughout. The right femoral artery stent appears to be patent. There is evidence of elevated velocities in the distal right femoral artery suggestive of 30-49% stenosis.   Anti-Infectives:    Vancomycin 06/10/16--->  Zosyn 06/10/16--->  Subjective:   Bowels finally moved, so feels better,  though  he is still having pain requiring IV pain medications.  No dyspnea.   Objective:    Vitals:   06/15/16 0602 06/15/16 1434 06/15/16 2103 06/16/16 0520  BP: (!) 168/85 124/75 (!) 174/90 101/83  Pulse: (!) 110 100 100 (!) 101  Resp:  15    Temp: (!) 101 F (38.3 C) (!) 101.2 F (38.4 C) 99 F (37.2 C) 98.4 F (36.9 C)  TempSrc: Oral Oral Oral Oral  SpO2: 97% 98% 100% 95%  Weight:      Height:        Intake/Output Summary (Last 24 hours) at 06/16/16 0856 Last data filed at 06/15/16 1700  Gross per 24 hour  Intake             3011 ml  Output                0 ml  Net             3011 ml   Filed Weights   06/10/16 1315  Weight: 86.2 kg (190 lb)    Exam: General exam: Looks much better today, awake and conversant.  Respiratory system: Clear to auscultation. Respiratory effort normal. Cardiovascular system: S1 & S2 heard, RRR. No JVD,  rubs, gallops or clicks. II/VI systolic murmur. Gastrointestinal system: Abdomen is nondistended, soft and nontender. No organomegaly or masses felt. Normal bowel sounds heard. Central nervous system: Awake and alert. No focal neurological deficits. Extremities: Left BKA with well-healed stump, right foot wrapped in ACE wrap, dressings CDI. Skin: Warm and dry. Psychiatry: Judgement and insight appear normal. Mood & affect appropriate.   Data Reviewed:   I have personally reviewed following labs and imaging studies:  Labs: Basic Metabolic Panel:  Recent Labs Lab 06/10/16 1316 06/11/16 0434 06/13/16 0445 06/14/16 0537 06/16/16 0554  NA 134* 135 130* 130* 132*  K 4.4 3.8 4.1 4.2 3.6  CL 102 104 99* 97* 96*  CO2 23 27 23 25 27   GLUCOSE 254* 180* 214* 291* 61*  BUN 6 7 16 13 11   CREATININE 0.75 0.89 1.24 1.03 1.08  CALCIUM 9.2 8.6* 8.4* 8.3* 8.5*   GFR Estimated Creatinine Clearance: 79.1 mL/min (by C-G formula based on SCr of 1.08 mg/dL). Liver Function Tests:  Recent Labs Lab 06/10/16 1316  AST 26  ALT 28  ALKPHOS  72  BILITOT 0.1*  PROT 8.6*  ALBUMIN 3.0*   No results for input(s): LIPASE, AMYLASE in the last 168 hours. No results for input(s): AMMONIA in the last 168 hours. Coagulation profile  Recent Labs Lab 06/10/16 1316  INR 1.12    CBC:  Recent Labs Lab 06/10/16 1316  06/12/16 0524 06/13/16 0445 06/14/16 0537 06/15/16 0717 06/16/16 0554  WBC 9.3  < > 9.5 9.7 14.5* 15.4* 15.8*  NEUTROABS 6.7  --   --   --   --   --   --   HGB 8.1*  < > 7.9* 7.1* 7.0* 7.4* 7.1*  HCT 26.8*  < > 25.5* 23.9* 23.7* 24.7* 23.4*  MCV 81.7  < > 82.3 83.6 83.7 83.4 83.3  PLT 500*  < > 466* 506* 520* 536* 494*  < > = values in this interval not displayed. CBG:  Recent Labs Lab 06/15/16 1213 06/15/16 1635 06/15/16 2106 06/16/16 0615 06/16/16 0659  GLUCAP 175* 184* 203* 53* 114*   Sepsis Labs:  Recent Labs Lab 06/10/16 1344  06/13/16 0445 06/14/16 0537 06/15/16 0717 06/16/16 0554  WBC  --   < >  9.7 14.5* 15.4* 15.8*  LATICACIDVEN 1.32  --   --   --   --   --   < > = values in this interval not displayed.  Microbiology Recent Results (from the past 240 hour(s))  Culture, blood (Routine x 2)     Status: None   Collection Time: 06/10/16  1:30 PM  Result Value Ref Range Status   Specimen Description BLOOD RIGHT WRIST  Final   Special Requests IN PEDIATRIC BOTTLE Blood Culture adequate volume  Final   Culture NO GROWTH 5 DAYS  Final   Report Status 06/15/2016 FINAL  Final  Culture, blood (Routine x 2)     Status: None   Collection Time: 06/10/16  2:40 PM  Result Value Ref Range Status   Specimen Description BLOOD LEFT ANTECUBITAL  Final   Special Requests   Final    BOTTLES DRAWN AEROBIC AND ANAEROBIC Blood Culture adequate volume   Culture NO GROWTH 5 DAYS  Final   Report Status 06/15/2016 FINAL  Final    Radiology: No results found.  Medications:   . allopurinol  200 mg Oral QPC supper  . aspirin  325 mg Oral Daily  . carvedilol  3.125 mg Oral BID WC  . clopidogrel  75  mg Oral Q breakfast  . fentaNYL (SUBLIMAZE) injection  100 mcg Intravenous Once  . ferrous sulfate  325 mg Oral Daily  . gabapentin  1,200 mg Oral QHS  . gabapentin  600 mg Oral 2 times per day  . heparin  5,000 Units Subcutaneous Q8H  . insulin aspart  0-20 Units Subcutaneous TID WC  . insulin aspart  0-5 Units Subcutaneous QHS  . insulin glargine  55 Units Subcutaneous QHS  . lisinopril  5 mg Oral Daily  . polyethylene glycol  17 g Oral Daily  . senna-docusate  1 tablet Oral BID  . simvastatin  20 mg Oral Daily  . tamsulosin  0.4 mg Oral Daily  . torsemide  20 mg Oral Daily   Continuous Infusions: . sodium chloride 75 mL/hr at 06/13/16 1500  . piperacillin-tazobactam (ZOSYN)  IV 3.375 g (06/16/16 0612)  . vancomycin Stopped (06/16/16 0436)    Level II visit.  (3 problem points, 2 data points, moderate risk)   LOS: 6 days   Allysha Tryon  [facility] Pager [phone]. If unable to reach me by pager, please call my cell phone at [phone].  *Please refer to [email or web address], password [email or web address] to get updated schedule on who will round on this patient, as hospitalists switch teams weekly. If 7PM-7AM, please contact night-coverage at [email or web address], password [email or web address] for any overnight needs.  06/16/2016, 8:56 AM

## 2016-06-17 ENCOUNTER — Other Ambulatory Visit: Payer: Self-pay | Admitting: *Deleted

## 2016-06-17 ENCOUNTER — Encounter (HOSPITAL_COMMUNITY): Payer: Self-pay | Admitting: Internal Medicine

## 2016-06-17 ENCOUNTER — Ambulatory Visit: Payer: Medicaid Other | Admitting: Surgery

## 2016-06-17 DIAGNOSIS — Z9862 Peripheral vascular angioplasty status: Secondary | ICD-10-CM

## 2016-06-17 DIAGNOSIS — I70244 Atherosclerosis of native arteries of left leg with ulceration of heel and midfoot: Secondary | ICD-10-CM

## 2016-06-17 LAB — GLUCOSE, CAPILLARY
GLUCOSE-CAPILLARY: 226 mg/dL — AB (ref 65–99)
Glucose-Capillary: 104 mg/dL — ABNORMAL HIGH (ref 65–99)

## 2016-06-17 LAB — CBC
HCT: 22.7 % — ABNORMAL LOW (ref 39.0–52.0)
Hemoglobin: 6.9 g/dL — CL (ref 13.0–17.0)
MCH: 25.6 pg — ABNORMAL LOW (ref 26.0–34.0)
MCHC: 30.4 g/dL (ref 30.0–36.0)
MCV: 84.1 fL (ref 78.0–100.0)
PLATELETS: 528 10*3/uL — AB (ref 150–400)
RBC: 2.7 MIL/uL — AB (ref 4.22–5.81)
RDW: 17.4 % — ABNORMAL HIGH (ref 11.5–15.5)
WBC: 11.6 10*3/uL — AB (ref 4.0–10.5)

## 2016-06-17 LAB — BASIC METABOLIC PANEL
ANION GAP: 6 (ref 5–15)
BUN: 14 mg/dL (ref 6–20)
CHLORIDE: 98 mmol/L — AB (ref 101–111)
CO2: 30 mmol/L (ref 22–32)
CREATININE: 1.26 mg/dL — AB (ref 0.61–1.24)
Calcium: 8.5 mg/dL — ABNORMAL LOW (ref 8.9–10.3)
GFR calc non Af Amer: 59 mL/min — ABNORMAL LOW (ref >60)
Glucose, Bld: 84 mg/dL (ref 65–99)
POTASSIUM: 4 mmol/L (ref 3.5–5.1)
SODIUM: 134 mmol/L — AB (ref 135–145)

## 2016-06-17 LAB — PREPARE RBC (CROSSMATCH)

## 2016-06-17 MED ORDER — OXYCODONE HCL 10 MG PO TABS: 10.0000 mg | ORAL_TABLET | ORAL | 0 refills | Status: DC | PRN

## 2016-06-17 MED ORDER — SODIUM CHLORIDE 0.9 % IV SOLN
Freq: Once | INTRAVENOUS | Status: AC
  Administered 2016-06-17: 04:00:00 via INTRAVENOUS

## 2016-06-17 MED ORDER — INSULIN GLARGINE 100 UNIT/ML ~~LOC~~ SOLN: 50.0000 [IU] | Freq: Every day | SUBCUTANEOUS | 11 refills | Status: DC

## 2016-06-17 MED ORDER — OXYCODONE HCL 5 MG PO TABS
10.0000 mg | ORAL_TABLET | ORAL | Status: DC | PRN
  Administered 2016-06-17: 15 mg via ORAL
  Filled 2016-06-17: qty 3

## 2016-06-17 MED ORDER — SENNOSIDES-DOCUSATE SODIUM 8.6-50 MG PO TABS: 1.0000 | ORAL_TABLET | Freq: Two times a day (BID) | ORAL | Status: DC

## 2016-06-17 NOTE — Progress Notes (Signed)
Pharmacy Antibiotic Note  Cory Weber is a 66 y.o. male admitted on 06/10/2016 with cellulitis/gangrenous toe and possible underlying osteomyelitis, now s/p right TMT amputation.  Pharmacy has been consulted for vancomycin and Zosyn dosing.  Patient's SCr is trending up.  He is afebrile and his WBC is improving.  Last vancomycin trough was therapeutic with similar renal function.   Plan: Continue vancomycin 1gm IV Q12H Continue Zosyn 3.375gm IV Q8H, 4 hr infusion Monitor renal fxn, micro data, repeat vanc trough if patient continues on vancomycin F/U abx LOT post amputation, BMET in AM   Height: 6\' 1"  (185.4 cm) Weight: 190 lb (86.2 kg) IBW/kg (Calculated) : 79.9  Temp (24hrs), Avg:98.6 F (37 C), Min:97.5 F (36.4 C), Max:99.6 F (37.6 C)   Recent Labs Lab 06/10/16 1344 06/11/16 0434  06/13/16 0445 06/13/16 1532 06/14/16 0537 06/15/16 0717 06/16/16 0554 06/17/16 0315  WBC  --  9.4  < > 9.7  --  14.5* 15.4* 15.8* 11.6*  CREATININE  --  0.89  --  1.24  --  1.03  --  1.08 1.26*  LATICACIDVEN 1.32  --   --   --   --   --   --   --   --   VANCOTROUGH  --   --   --   --  19  --   --   --   --   < > = values in this interval not displayed.  Estimated Creatinine Clearance: 67.8 mL/min (A) (by C-G formula based on SCr of 1.26 mg/dL (H)).    No Known Allergies   Vanc 4/30 >> Zosyn 4/30 >>  5/3 VT = 19 mcg/mL on 1g q12 >> no change (SCr 1.24)  4/30 BCx - engative   Tricia Pledger D. Mina Marble, PharmD, BCPS Pager:  [phone] 06/17/2016, 7:38 AM

## 2016-06-17 NOTE — Progress Notes (Signed)
  Progress Note    06/17/2016 3:17 PM 4 Days Post-Op  Subjective:  No acute issues  Vitals:   06/17/16 1140 06/17/16 1330  BP: (!) 117/97 117/82  Pulse: 86 89  Resp: 18 18  Temp: 98.5 F (36.9 C) 98.2 F (36.8 C)    Physical Exam: [id number] Non labored respirations Right tma site with area of drainage in center, sutures in tact, no cellultis  CBC    Component Value Date/Time   WBC 11.6 (H) 06/17/2016 0315   RBC 2.70 (L) 06/17/2016 0315   HGB 6.9 (LL) 06/17/2016 0315   HCT 22.7 (L) 06/17/2016 0315   HCT 26.3 (L) 11/26/2015 1440   HCT 32 10/31/2011 1319   PLT 528 (H) 06/17/2016 0315   MCV 84.1 06/17/2016 0315   MCV 82.8 10/31/2011 1319   MCH 25.6 (L) 06/17/2016 0315   MCHC 30.4 06/17/2016 0315   RDW 17.4 (H) 06/17/2016 0315   LYMPHSABS 2.1 06/10/2016 1316   MONOABS 0.4 06/10/2016 1316   EOSABS 0.1 06/10/2016 1316   BASOSABS 0.0 06/10/2016 1316    BMET    Component Value Date/Time   NA 134 (L) 06/17/2016 0315   NA 134 (A) 12/27/2015   K 4.0 06/17/2016 0315   K 4.0 10/31/2011 1319   CL 98 (L) 06/17/2016 0315   CO2 30 06/17/2016 0315   GLUCOSE 84 06/17/2016 0315   BUN 14 06/17/2016 0315   BUN 16 12/27/2015   CREATININE 1.26 (H) 06/17/2016 0315   CREATININE 1.06 12/22/2015 1551   CALCIUM 8.5 (L) 06/17/2016 0315   CALCIUM 9.0 10/31/2011 1319   GFRNONAA 59 (L) 06/17/2016 0315   GFRAA >60 06/17/2016 0315    INR    Component Value Date/Time   INR 1.12 06/10/2016 1316     Intake/Output Summary (Last 24 hours) at 06/17/16 1517 Last data filed at 06/17/16 1151  Gross per 24 hour  Intake           2755.5 ml  Output             1300 ml  Net           1455.5 ml     Assessment:  66 y.o. male is s/p right tma and right sfa stenting  Plan: Ok for d/c to snf today f/u in a few weeks   Cory Weber C. Donzetta Matters, MD [facility] Office: [phone] Pager: [phone]  06/17/2016 3:17 PM

## 2016-06-17 NOTE — Progress Notes (Signed)
Occupational Therapy Treatment Patient Details Name: Cory Weber MRN: [id number] DOB: 01-Oct-1950 Today's Date: 06/17/2016    History of present illness Cory Weber is a 66 y.o. male with medical history significant of asthma, BPH, CAD, CVA, hepatitis C, hyperlipidemia, hypertension, peripheral vascular disease, diabetes, left BKA. Of note patient was discharged from the hospital  on 05/31/2016 after treatment for right ischemic foot secondary to severe PVD. During admission patient underwent arthrectomy of the right SFA and had a drug-coated balloon angioplasty with stent placement. Patient was started on Plavix and aspirin at that time. Patient states his right foot has been in severe pain since time of his previous admission with no improvement. 06/13/16 R TMA     OT comments  Session limited to ADL seated at EOB. Pt currently with hgb of 6.9 and receiving blood. Educated pt in weight bearing precautions and in compensatory strategies for LB bathing and dressing in sitting. SNF continues to be appropriate.  Follow Up Recommendations  SNF    Equipment Recommendations  None recommended by OT    Recommendations for Other Services      Precautions / Restrictions Precautions Precautions: Fall Required Braces or Orthoses: Other Brace/Splint Other Brace/Splint: R darco shoe Restrictions Weight Bearing Restrictions: Yes Other Position/Activity Restrictions: R LE post op shoe for ambulation weight bearing through R heel only        Mobility Bed Mobility Overal bed mobility: Needs Assistance Bed Mobility: Supine to Sit;Sit to Supine     Supine to sit: Supervision Sit to supine: Min guard   General bed mobility comments: HOB up, use of rail  Transfers   Equipment used: None             General transfer comment: deferred due to hgb    Balance Overall balance assessment: Needs assistance   Sitting balance-Leahy Scale: Good                                      ADL either performed or assessed with clinical judgement   ADL Overall ADL's : Needs assistance/impaired     Grooming: Wash/dry hands;Wash/dry face;Oral care;Sitting;Supervision/safety Grooming Details (indicate cue type and reason): at EOB         Upper Body Dressing : Modified independent   Lower Body Dressing: Maximal assistance;Sitting/lateral leans Lower Body Dressing Details (indicate cue type and reason): max assist for post op shoe, does not fit               General ADL Comments: educated in leaning side to side for LB bathing and dressing.     Vision   Additional Comments: Cory Weber wanting to maintain post op shoe, not DARCO with continued WB through heel only.   Perception     Praxis      Cognition Arousal/Alertness: Awake/alert Behavior During Therapy: WFL for tasks assessed/performed Overall Cognitive Status: Within Functional Limits for tasks assessed                                          Exercises     Shoulder Instructions       General Comments      Pertinent Vitals/ Pain       Pain Assessment: Faces Faces Pain Scale: Hurts little more Pain Location: R foot Pain Descriptors /  Indicators: Sore  Home Living                                          Prior Functioning/Environment              Frequency  Min 2X/week        Progress Toward Goals  OT Goals(current goals can now be found in the care plan section)  Progress towards OT goals: Not progressing toward goals - comment (pt with low hgb)  Acute Rehab OT Goals Patient Stated Goal: To be more independent and eventually try to walk agaqin. OT Goal Formulation: With patient Time For Goal Achievement: 06/26/16 Potential to Achieve Goals: Good  Plan Discharge plan remains appropriate    Co-evaluation                 AM-PAC PT "6 Clicks" Daily Activity     Outcome Measure   Help from another person eating meals?:  None Help from another person taking care of personal grooming?: None Help from another person toileting, which includes using toliet, bedpan, or urinal?: A Little Help from another person bathing (including washing, rinsing, drying)?: A Little Help from another person to put on and taking off regular upper body clothing?: A Little Help from another person to put on and taking off regular lower body clothing?: A Lot 6 Click Score: 19    End of Session    OT Visit Diagnosis: Pain;Unsteadiness on feet (R26.81);Muscle weakness (generalized) (M62.81) Pain - Right/Left: Right Pain - part of body: Ankle and joints of foot   Activity Tolerance Patient tolerated treatment well   Patient Left in bed;with call bell/phone within reach   Nurse Communication          Time: 0211-1552 OT Time Calculation (min): 27 min  Charges: OT General Charges $OT Visit: 1 Procedure OT Treatments $Self Care/Home Management : 23-37 mins     Cory Weber 06/17/2016, 10:46 AM  [phone]

## 2016-06-17 NOTE — Clinical Social Work Placement (Addendum)
   CLINICAL SOCIAL WORK PLACEMENT  NOTE  Date:  06/17/2016  Patient Details  Name: Cory Weber MRN: [id number] Date of Birth: [birth year]  Clinical Social Work is seeking post-discharge placement for this patient at the [place] level of care (*CSW will initial, date and re-position this form in  chart as items are completed):  Yes   Patient/family provided with [place] Work Department's list of facilities offering this level of care within the geographic area requested by the patient (or if unable, by the patient's family).  Yes   Patient/family informed of their freedom to choose among providers that offer the needed level of care, that participate in Medicare, Medicaid or managed care program needed by the patient, have an available bed and are willing to accept the patient.  Yes   Patient/family informed of [facility]'s ownership interest in [facility] and [facility], as well as of the fact that they are under no obligation to receive care at these facilities.  PASRR submitted to EDS on       PASRR number received on 06/15/16     Existing PASRR number confirmed on 06/15/16     FL2 transmitted to all facilities in geographic area requested by pt/family on 06/15/16     FL2 transmitted to all facilities within larger geographic area on 06/15/16     Patient informed that his/her managed care company has contracts with or will negotiate with certain facilities, including the following:        Yes   Patient/family informed of bed offers received.  Patient chooses bed at  ([facility])     Physician recommends and patient chooses bed at      Patient to be transferred to  [facility]) on 06/17/16.  Patient to be transferred to facility by [facility]     Patient family notified on 06/17/16 of transfer.  Name of family member notified:    Geannie Risen, sister    PHYSICIAN Please prepare priority discharge  summary, including medications, Please prepare prescriptions, Please sign FL2     Additional Comment:    _______________________________________________ Normajean Baxter, LCSW 06/17/2016, 11:42 AM

## 2016-06-17 NOTE — Social Work (Addendum)
Clinical Social Worker facilitated patient discharge including contacting patient family and facility to confirm patient discharge plans.  Clinical information faxed to facility and family agreeable with plan.  CSW arranged ambulance transport via [facility] to [place].  RN to call [phone] report prior to discharge. Patient going to Rm 132A.  Clinical Social Worker will sign off for now as social work intervention is no longer needed. Please consult [place] again if new need arises.  Elissa Hefty, LCSW Clinical Social Worker [phone]

## 2016-06-17 NOTE — Progress Notes (Signed)
CRITICAL VALUE ALERT  Critical value received: Hgb 6.9  Date of notification:  06/17/2016  Time of notification: 0410  Critical value read back: yes  Nurse who received alert:  Cyril Loosen, RN  MD notified (1st page):  Dr. Olevia Bowens  Time of first page:  0415  MD notified (2nd page):0000  Time of second page:  Responding MD: Dr. Olevia Bowens  Time MD responded: [phone]

## 2016-06-17 NOTE — Progress Notes (Signed)
Discharge order received, RN notified by SW that transportation by [facility] has been set up and that RN could call report to facility. RN called report to Andee Poles, Therapist, sports, at [facility]. Danielle, RN, verbalized understanding of report. Patient's two IV's removed. Facesheet, prescriptions, and AVS ready for [facility] transport. Patient assisted in getting dressed and his belongings were bagged up for transport.

## 2016-06-17 NOTE — Discharge Summary (Signed)
Physician Discharge Summary  Cory Weber FMB:[id number] DOB: 04-May-1950 DOA: 06/10/2016  PCP: Iona Beard, MD  Admit date: 06/10/2016 Discharge date: 06/17/2016  Admitted From: Home Discharge disposition: SNF   Recommendations for Outpatient Follow-Up:   1. Would repeat CBC daily until stable.   Discharge Diagnosis:   Principal Problem:   Wet gangrene ([place]) Active Problems:   Essential hypertension   BPH (benign prostatic hyperplasia)   Amputation of left lower extremity below knee ([place])   Uncontrolled type II diabetes with peripheral autonomic neuropathy ([place])   Dyslipidemia associated with type 2 diabetes mellitus (HCC)   Ischemia of foot   Physical deconditioning   Anemia, chronic disease   Intractable pain   Chronic diastolic CHF (congestive heart failure) (HCC)   Gangrene (HCC)   Postoperative fever   Constipation due to opioid therapy    Discharge Condition: Improved.  Diet recommendation: Low sodium, heart healthy.  Carbohydrate-modified.    Wound care: Per vascular surgery.   History of Present Illness:   Cory Weber is an 66 y.o. male with a PMH of asthma, BPH, CAD, history of CVA, hepatitis C, hyperlipidemia, hypertension, PVD with left BKA, diabetes, and recent hospitalization 05/31/16-06/04/16 for treatment of ischemia of right foot with gangrene, status post arthrectomy and balloon angioplasty of right SFA with stent, discharged on aspirin and Plavix, who was admitted 06/10/16 for severe right foot pain unrelieved by oral opiates.Patient was subsequently evaluated by vascular surgery and ultimately underwent a right transmetatarsal amputation on 06/13/16.  Hospital Course by Problem:   Principal Problem:   Wet gangrene (HCC)/Ischemia of foot Evaluated by vascular surgery. ABIs/arterial Dopplers obtained with results documented below. Underwent a right transmetatarsal amputation 06/13/16. Initially treated with vancomycin/Zosyn, discontinue as blood  cultures have been negative. Was unable to discharge to SNF 06/14/16 due to severe pain and need for ongoing IV Dilaudid for pain control. Pain now being controlled on oxycodone okay to discharge to SNF.  Active Problems:   Opiate-induced constipation Bowel regimen in place. Bowels moving.    Postoperative Fever Afebrile 24 hours, WBC trending down.  Blood cultures negative.     Essential hypertension Continue Coreg, Demadex and lisinopril.Currently controlled.    BPH (benign prostatic hyperplasia) Continue Flomax. No reports of urinary retention.    PVD/history of Amputation of left lower extremity below knee ([place]) Continue aspirin/Plavix.    Uncontrolled type II diabetes with peripheral autonomic neuropathy (HCC) Currently being managed with resistant scale SSI Q AC/HS and 50 units of Lantus daily. CBG range 84-122.     Dyslipidemia associated with type 2 diabetes mellitus (HCC) Continue Zocor.    Physical deconditioning Continue PT/OT. Discharge to SNF.    Anemia, chronic disease Given 1 unit of PRBCs 06/11/16 for hemoglobin of 6.6 mg/dL. Hemoglobin 6.9 mg/dL today, for 1 unit of PRBCs again today. Continue iron supplementation. Okay to discharge to SNF after blood transfusion completed.    Intractable pain Continue Neurontin, Dilaudid, Robaxin, and oxycodone.    Hyponatremia Likely reflective of CHF physiology. 134 today, improving.    Chronic diastolic CHF (congestive heart failure) ([place]) 2-D echocardiogram done 02/15/16, EF 50-55 percent. Continue torsemide.  HIV screening The patient falls between the ages of 57-64, screened 08/15/15: Non-reactive.  Medical Consultants:    Vascular Surgery   Discharge Exam:   Vitals:   06/17/16 0856 06/17/16 1140  BP: (!) 141/79 (!) 117/97  Pulse: 96 86  Resp: 18 18  Temp: 98.7 F (37.1 C) 98.5 F (36.9 C)  Vitals:   06/17/16 0651 06/17/16 0838 06/17/16 0856 06/17/16 1140  BP: 111/61 123/72 (!) 141/79 (!)  117/97  Pulse: 99 96 96 86  Resp: 16 18 18 18   Temp: 99.6 F (37.6 C) 98.2 F (36.8 C) 98.7 F (37.1 C) 98.5 F (36.9 C)  TempSrc: Oral Oral Oral Oral  SpO2: 100% 95% 97% 100%  Weight:      Height:        General exam:  Appears calm and comfortable.  Respiratory system: Clear to auscultation. Respiratory effort normal. Cardiovascular system: S1 & S2 heard, RRR. No JVD,  rubs, gallops or clicks. II/VI systolic murmur. Gastrointestinal system: Abdomen is nondistended, soft and nontender. No organomegaly or masses felt. Normal bowel sounds heard. Central nervous system: Awake and alert. No focal neurological deficits. Extremities: Left BKA with well-healed stump, right foot wrapped in ACE wrap, dressings CDI. Skin: Warm and dry. Psychiatry: Judgement and insight appear normal. Mood & affect appropriate.    The results of significant diagnostics from this hospitalization (including imaging, microbiology, ancillary and laboratory) are listed below for reference.     Procedures and Diagnostic Studies:   Dg Foot Complete Right  Result Date: 06/10/2016 CLINICAL DATA:  Lateral soft tissue wound draining purulent material. EXAM: RIGHT FOOT COMPLETE - 3+ VIEW COMPARISON:  05/31/2016 FINDINGS: No acute fracture or dislocation is noted no underlying bony destruction is seen. No gross soft tissue abnormality is noted. IMPRESSION: No acute abnormality seen. Electronically Signed   By: Inez Catalina M.D.   On: 06/10/2016 15:38     Labs:   Basic Metabolic Panel:  Recent Labs Lab 06/11/16 0434 06/13/16 0445 06/14/16 0537 06/16/16 0554 06/17/16 0315  NA 135 130* 130* 132* 134*  K 3.8 4.1 4.2 3.6 4.0  CL 104 99* 97* 96* 98*  CO2 27 23 25 27 30   GLUCOSE 180* 214* 291* 61* 84  BUN 7 16 13 11 14   CREATININE 0.89 1.24 1.03 1.08 1.26*  CALCIUM 8.6* 8.4* 8.3* 8.5* 8.5*   GFR Estimated Creatinine Clearance: 67.8 mL/min (A) (by C-G formula based on SCr of 1.26 mg/dL (H)). Liver Function  Tests:  Recent Labs Lab 06/10/16 1316  AST 26  ALT 28  ALKPHOS 72  BILITOT 0.1*  PROT 8.6*  ALBUMIN 3.0*   No results for input(s): LIPASE, AMYLASE in the last 168 hours. No results for input(s): AMMONIA in the last 168 hours. Coagulation profile  Recent Labs Lab 06/10/16 1316  INR 1.12    CBC:  Recent Labs Lab 06/10/16 1316  06/13/16 0445 06/14/16 0537 06/15/16 0717 06/16/16 0554 06/17/16 0315  WBC 9.3  < > 9.7 14.5* 15.4* 15.8* 11.6*  NEUTROABS 6.7  --   --   --   --   --   --   HGB 8.1*  < > 7.1* 7.0* 7.4* 7.1* 6.9*  HCT 26.8*  < > 23.9* 23.7* 24.7* 23.4* 22.7*  MCV 81.7  < > 83.6 83.7 83.4 83.3 84.1  PLT 500*  < > 506* 520* 536* 494* 528*  < > = values in this interval not displayed. Cardiac Enzymes: No results for input(s): CKTOTAL, CKMB, CKMBINDEX, TROPONINI in the last 168 hours. BNP: Invalid input(s): POCBNP CBG:  Recent Labs Lab 06/16/16 0659 06/16/16 1153 06/16/16 1628 06/16/16 2056 06/17/16 0651  GLUCAP 114* 84 102* 122* 104*   D-Dimer No results for input(s): DDIMER in the last 72 hours. Hgb A1c No results for input(s): HGBA1C in the last 72 hours. Lipid  Profile No results for input(s): CHOL, HDL, LDLCALC, TRIG, CHOLHDL, LDLDIRECT in the last 72 hours. Thyroid function studies No results for input(s): TSH, T4TOTAL, T3FREE, THYROIDAB in the last 72 hours.  Invalid input(s): FREET3 Anemia work up No results for input(s): VITAMINB12, FOLATE, FERRITIN, TIBC, IRON, RETICCTPCT in the last 72 hours. Microbiology Recent Results (from the past 240 hour(s))  Culture, blood (Routine x 2)     Status: None   Collection Time: 06/10/16  1:30 PM  Result Value Ref Range Status   Specimen Description BLOOD RIGHT WRIST  Final   Special Requests IN PEDIATRIC BOTTLE Blood Culture adequate volume  Final   Culture NO GROWTH 5 DAYS  Final   Report Status 06/15/2016 FINAL  Final  Culture, blood (Routine x 2)     Status: None   Collection Time: 06/10/16   2:40 PM  Result Value Ref Range Status   Specimen Description BLOOD LEFT ANTECUBITAL  Final   Special Requests   Final    BOTTLES DRAWN AEROBIC AND ANAEROBIC Blood Culture adequate volume   Culture NO GROWTH 5 DAYS  Final   Report Status 06/15/2016 FINAL  Final     Discharge Instructions:   Discharge Instructions    Call MD for:  extreme fatigue    Complete by:  As directed    Call MD for:  severe uncontrolled pain    Complete by:  As directed    Call MD for:  temperature >100.4    Complete by:  As directed    Diet - low sodium heart healthy    Complete by:  As directed    Diet Carb Modified    Complete by:  As directed    Increase activity slowly    Complete by:  As directed    Walk with assistance    Complete by:  As directed    Walker     Complete by:  As directed      Allergies as of 06/17/2016   No Known Allergies     Medication List    STOP taking these medications   HYDROcodone-acetaminophen 5-325 MG tablet Commonly known as:  NORCO/VICODIN     TAKE these medications   allopurinol 100 MG tablet Commonly known as:  ZYLOPRIM Take 200 mg by mouth daily after supper.   aspirin 325 MG tablet Take 325 mg by mouth daily.   carvedilol 3.125 MG tablet Commonly known as:  COREG Take 1 tablet (3.125 mg total) by mouth 2 (two) times daily with a meal.   clopidogrel 75 MG tablet Commonly known as:  PLAVIX Take 1 tablet (75 mg total) by mouth daily with breakfast.   ferrous sulfate 325 (65 FE) MG tablet Take 1 tablet (325 mg total) by mouth 3 (three) times daily with meals. What changed:  when to take this   gabapentin 600 MG tablet Commonly known as:  NEURONTIN Take 600-1,200 mg by mouth See admin instructions. Take 1 tablet (600 mg) by mouth twice daily - morning and afternoon and take 2 tablets (1200 mg) at bedtime   insulin aspart 100 UNIT/ML injection Commonly known as:  novoLOG Inject 22 Units into the skin 3 (three) times daily before meals.     insulin glargine 100 UNIT/ML injection Commonly known as:  LANTUS Inject 0.5 mLs (50 Units total) into the skin at bedtime. What changed:  how much to take   lisinopril 5 MG tablet Commonly known as:  PRINIVIL,ZESTRIL Take 5 mg by mouth daily.  magnesium hydroxide 400 MG/5ML suspension Commonly known as:  MILK OF MAGNESIA Take 30 mLs by mouth daily as needed for mild constipation.   metFORMIN 500 MG tablet Commonly known as:  GLUCOPHAGE Take 500 mg by mouth daily with breakfast.   methocarbamol 500 MG tablet Commonly known as:  ROBAXIN Take 500 mg by mouth every 6 (six) hours as needed for muscle spasms.   Oxycodone HCl 10 MG Tabs Take 1-1.5 tablets (10-15 mg total) by mouth every 4 (four) hours as needed for moderate pain.   polyethylene glycol packet Commonly known as:  MIRALAX / GLYCOLAX Take 17 g by mouth daily. What changed:  when to take this  reasons to take this  additional instructions   senna-docusate 8.6-50 MG tablet Commonly known as:  Senokot-S Take 1 tablet by mouth 2 (two) times daily.   simvastatin 20 MG tablet Commonly known as:  ZOCOR Take 20 mg by mouth daily.   tamsulosin 0.4 MG Caps capsule Commonly known as:  FLOMAX Take 1 capsule (0.4 mg total) by mouth daily.   torsemide 20 MG tablet Commonly known as:  DEMADEX Take 20 mg by mouth daily.       Contact information for follow-up providers    Waynetta Sandy, MD Follow up in 2 day(s).   Specialties:  Vascular Surgery, Cardiology Why:  office will call Contact information: [place] [address] [phone]            Contact information for after-discharge care    [facility] .   Specialty:  [place] information: [address] [place] [phone]                   Time coordinating discharge: Greater than 35  minutes.  Signed:  RAMA,CHRISTINA  Pager [phone] [facility] 06/17/2016, 11:45 AM

## 2016-06-18 ENCOUNTER — Non-Acute Institutional Stay (SKILLED_NURSING_FACILITY): Payer: Medicaid Other | Admitting: Adult Health

## 2016-06-18 ENCOUNTER — Encounter: Payer: Self-pay | Admitting: Adult Health

## 2016-06-18 DIAGNOSIS — I999 Unspecified disorder of circulatory system: Secondary | ICD-10-CM | POA: Diagnosis not present

## 2016-06-18 DIAGNOSIS — M1A3710 Chronic gout due to renal impairment, right ankle and foot, without tophus (tophi): Secondary | ICD-10-CM

## 2016-06-18 DIAGNOSIS — I5032 Chronic diastolic (congestive) heart failure: Secondary | ICD-10-CM

## 2016-06-18 DIAGNOSIS — IMO0002 Reserved for concepts with insufficient information to code with codable children: Secondary | ICD-10-CM

## 2016-06-18 DIAGNOSIS — E785 Hyperlipidemia, unspecified: Secondary | ICD-10-CM | POA: Diagnosis not present

## 2016-06-18 DIAGNOSIS — T402X5A Adverse effect of other opioids, initial encounter: Secondary | ICD-10-CM

## 2016-06-18 DIAGNOSIS — I998 Other disorder of circulatory system: Secondary | ICD-10-CM

## 2016-06-18 DIAGNOSIS — M79671 Pain in right foot: Secondary | ICD-10-CM | POA: Diagnosis not present

## 2016-06-18 DIAGNOSIS — E1143 Type 2 diabetes mellitus with diabetic autonomic (poly)neuropathy: Secondary | ICD-10-CM

## 2016-06-18 DIAGNOSIS — K5903 Drug induced constipation: Secondary | ICD-10-CM

## 2016-06-18 DIAGNOSIS — D638 Anemia in other chronic diseases classified elsewhere: Secondary | ICD-10-CM | POA: Diagnosis not present

## 2016-06-18 DIAGNOSIS — E1165 Type 2 diabetes mellitus with hyperglycemia: Secondary | ICD-10-CM | POA: Diagnosis not present

## 2016-06-18 DIAGNOSIS — E1169 Type 2 diabetes mellitus with other specified complication: Secondary | ICD-10-CM | POA: Diagnosis not present

## 2016-06-18 LAB — TYPE AND SCREEN
ABO/RH(D): O POS
Antibody Screen: NEGATIVE
Unit division: 0

## 2016-06-18 LAB — BPAM RBC
BLOOD PRODUCT EXPIRATION DATE: 201805162359
ISSUE DATE / TIME: 201805070821
UNIT TYPE AND RH: 5100

## 2016-06-18 LAB — CBC AND DIFFERENTIAL
HCT: 25 % — AB (ref 41–53)
Hemoglobin: 8 g/dL — AB (ref 13.5–17.5)
Neutrophils Absolute: 8 /uL
Platelets: 491 10*3/uL — AB (ref 150–399)
WBC: 10.7 10*3/mL

## 2016-06-18 NOTE — Progress Notes (Signed)
Location:   [facility] Room Number: [place] of Service:  SNF (31)   CODE STATUS: full Code  No Known Allergies  Chief Complaint  Patient presents with  . Hospitalization Follow-up    Hospital follow up    HPI:  He has been hospitalized for a right ischemic foot with wet gangrene; is status post right transmetatarsal amputation on 06-13-16.  He has required blood transfusion for his anemia. He is here for short term rehab with his goal to return back home. He complaining of right foot pain which is not being managed at this time.   Past Medical History:  Diagnosis Date  . Anemia   . Asthma   . BPH (benign prostatic hyperplasia)   . Cellulitis and abscess of foot 07/2015  . Chronic kidney disease    CKD stage 3  . CVA (cerebral vascular accident) ([place]) 2015   denies residual on 08/15/2015  . Hepatitis C    states he was diagnosed in 2007 or 2007 while living in [place], [place]  . Hyperlipidemia   . Hypertension   . Ischemia of foot 05/31/2016  . Peripheral neuropathy   . Pneumonia X 1  . PVD (peripheral vascular disease) ([place])   . Type II diabetes mellitus ([place])   . Wet gangrene ([place]) 06/10/2016    Past Surgical History:  Procedure Laterality Date  . ABDOMINAL AORTOGRAM N/A 06/03/2016   Procedure: Abdominal Aortogram;  Surgeon: Waynetta Sandy, MD;  Location: [facility];  Service: Cardiovascular;  Laterality: N/A;  . AMPUTATION Left 08/16/2015   Procedure: LEFT BELOW THE KNEE AMPUTATION ;  Surgeon: Serafina Mitchell, MD;  Location: [facility];  Service: Vascular;  Laterality: Left;  . BACK SURGERY    . BELOW KNEE LEG AMPUTATION Left 08/16/2015  . BIOPSY N/A 09/03/2012   Procedure: BIOPSY;  Surgeon: Daneil Dolin, MD;  Location: [facility];  Service: Endoscopy;  Laterality: N/A;  gastric and gastric mucosa  . BIOPSY N/A 12/03/2012   Procedure: BIOPSY;  Surgeon: Daneil Dolin, MD;  Location: [facility];  Service: Endoscopy;  Laterality: N/A;  . COLONOSCOPY  WITH PROPOFOL N/A 09/03/2012   XNA:TFTDDUK polyp-removed as outlined above. Prominent internal hemorrhoids. Tubular adenoma  . ESOPHAGOGASTRODUODENOSCOPY (EGD) WITH PROPOFOL N/A 09/03/2012   GUR:KYHCWC hernia. Gastric diverticulum. Gastric ulcers with associated erosions. Duodenal erosions. Status post gastric biopsy. H.PYLORI gastritis   . ESOPHAGOGASTRODUODENOSCOPY (EGD) WITH PROPOFOL N/A 12/03/2012   Dr. Gala Romney: gastric diverticulum, gastric erosions and scar. Previously noted gastric ulcer completed healed. Biopsy without H.pylori.   . ESOPHAGOGASTRODUODENOSCOPY (EGD) WITH PROPOFOL N/A 01/23/2016   Procedure: ESOPHAGOGASTRODUODENOSCOPY (EGD) WITH PROPOFOL;  Surgeon: Doran Stabler, MD;  Location: [facility];  Service: Gastroenterology;  Laterality: N/A;  . LIVER BIOPSY  2005   Done in [place], [place]. Chronic hepatitis with mild periportal inflammation, lobular unicellular necrosis and portal fibrosis. Grade 2, stage 1-2.  . LOWER EXTREMITY ANGIOGRAPHY Right 06/03/2016   Procedure: Lower Extremity Angiography;  Surgeon: Waynetta Sandy, MD;  Location: [facility];  Service: Cardiovascular;  Laterality: Right;  . MAXIMUM ACCESS (MAS)POSTERIOR LUMBAR INTERBODY FUSION (PLIF) 1 LEVEL N/A 11/25/2013   Procedure: FOR MAXIMUM ACCESS (MAS) POSTERIOR LUMBAR INTERBODY FUSION (PLIF) 1 LEVEL;  Surgeon: Eustace Moore, MD;  Location: [facility];  Service: Neurosurgery;  Laterality: N/A;  FOR MAXIMUM ACCESS (MAS) POSTERIOR LUMBAR INTERBODY FUSION (PLIF) 1 LEVEL LUMBAR 3-4  . PERIPHERAL VASCULAR ATHERECTOMY Right 06/03/2016   Procedure:  Peripheral Vascular Atherectomy;  Surgeon: Waynetta Sandy, MD;  Location: [facility];  Service: Cardiovascular;  Laterality: Right;  SFA WITH STENT  . PERIPHERAL VASCULAR CATHETERIZATION Left 08/01/2015   Procedure: Lower Extremity Angiography;  Surgeon: Serafina Mitchell, MD;  Location: [facility];  Service: Cardiovascular;  Laterality:  Left;  . PERIPHERAL VASCULAR CATHETERIZATION N/A 08/01/2015   Procedure: Abdominal Aortogram;  Surgeon: Serafina Mitchell, MD;  Location: [facility];  Service: Cardiovascular;  Laterality: N/A;  . PERIPHERAL VASCULAR CATHETERIZATION N/A 08/08/2015   Procedure: Abdominal Aortogram w/Lower Extremity;  Surgeon: Serafina Mitchell, MD;  Location: [facility];  Service: Cardiovascular;  Laterality: N/A;  . PERIPHERAL VASCULAR CATHETERIZATION Left 08/08/2015   Procedure: Peripheral Vascular Balloon Angioplasty;  Surgeon: Serafina Mitchell, MD;  Location: [facility];  Service: Cardiovascular;  Laterality: Left;  left popiteal artery, left peronealtrunk, left post tibial  . POLYPECTOMY N/A 09/03/2012   Procedure: POLYPECTOMY;  Surgeon: Daneil Dolin, MD;  [facility];  Service: Endoscopy;  Laterality: N/A;  cecal polyp  . TONSILLECTOMY    . TRANSMETATARSAL AMPUTATION Right 06/13/2016   Procedure: RIGHT TRANSMETATARSAL AMPUTATION;  Surgeon: Waynetta Sandy, MD;  [facility];  Service: Vascular;  Laterality: Right;    Social History   Social History  . Marital status: Divorced    Spouse name: N/A  . Number of children: 1  . Years of education: N/A   Occupational History  . trying to get disability    Social History Main Topics  . Smoking status: Former Smoker    Packs/day: 1.00    Years: 47.00    Types: Cigarettes    Quit date: 07/13/2015  . Smokeless tobacco: Never Used  . Alcohol use No     Comment:  none 08/15/2015 "stopped drinking 3-4 years ago"  . Drug use: No     Comment: 08/15/2015 "last used cocaine 3-4 months ago"  . Sexual activity: No   Other Topics Concern  . Not on file   Social History Narrative  . No narrative on file   Family History  Problem Relation Age of Onset  . Breast cancer Mother   . Heart failure Mother   . Diabetes Father   . Hypertension Father   . Heart disease Father   . Hyperlipidemia Father   . Diabetes Sister   .  Hypertension Sister   . Breast cancer Sister   . Colon cancer Neg Hx   . Liver disease Neg Hx       VITAL SIGNS BP (!) 149/101   Pulse (!) 110   Temp 97.5 F (36.4 C)   Resp 20   Ht 5\' 10"  (1.778 m)   Wt 190 lb (86.2 kg)   SpO2 94%   BMI 27.26 kg/m   Patient's Medications  New Prescriptions   No medications on file  Previous Medications   ALLOPURINOL (ZYLOPRIM) 100 MG TABLET    Take 200 mg by mouth daily after supper.    ASPIRIN 325 MG TABLET    Take 325 mg by mouth daily.   CARVEDILOL (COREG) 3.125 MG TABLET    Take 1 tablet (3.125 mg total) by mouth 2 (two) times daily with a meal.   CLOPIDOGREL (PLAVIX) 75 MG TABLET    Take 1 tablet (75 mg total) by mouth daily with breakfast.   FERROUS SULFATE 325 (65 FE) MG TABLET    Take 325 mg by mouth  3 (three) times daily with meals.   GABAPENTIN (NEURONTIN) 600 MG TABLET    Take 600-1,200 mg by mouth See admin instructions. Take 1 tablet (600 mg) by mouth twice daily - morning and afternoon and take 2 tablets (1200 mg) at bedtime   INSULIN ASPART (NOVOLOG) 100 UNIT/ML INJECTION    Inject 22 Units into the skin 3 (three) times daily before meals.    INSULIN GLARGINE (LANTUS) 100 UNIT/ML INJECTION    Inject 0.5 mLs (50 Units total) into the skin at bedtime.   LISINOPRIL (PRINIVIL,ZESTRIL) 5 MG TABLET    Take 5 mg by mouth daily.    MAGNESIUM HYDROXIDE (MILK OF MAGNESIA) 400 MG/5ML SUSPENSION    Take 30 mLs by mouth daily as needed for mild constipation.   METFORMIN (GLUCOPHAGE) 500 MG TABLET    Take 500 mg by mouth daily with breakfast.   METHOCARBAMOL (ROBAXIN) 500 MG TABLET    Take 500 mg by mouth every 6 (six) hours as needed for muscle spasms.    OXYCODONE 10 MG TABS    Take 1-1.5 tablets (10-15 mg total) by mouth every 4 (four) hours as needed for moderate pain.   POLYETHYLENE GLYCOL (MIRALAX / GLYCOLAX) PACKET    Take 17 g by mouth daily.   SENNA-DOCUSATE (SENOKOT-S) 8.6-50 MG TABLET    Take 1 tablet by mouth 2 (two) times daily.     SIMVASTATIN (ZOCOR) 20 MG TABLET    Take 20 mg by mouth daily.    TAMSULOSIN (FLOMAX) 0.4 MG CAPS CAPSULE    Take 1 capsule (0.4 mg total) by mouth daily.   TORSEMIDE (DEMADEX) 20 MG TABLET    Take 20 mg by mouth daily.   Modified Medications   No medications on file  Discontinued Medications   FERROUS SULFATE 325 (65 FE) MG TABLET    Take 1 tablet (325 mg total) by mouth 3 (three) times daily with meals.     SIGNIFICANT DIAGNOSTIC EXAMS  11-26-15: ct of chest: 1. Clusters of nodules and "tree-in-bud" opacities in the right greater than left lung apices compatible with bronchiolitis. 2. Aortic valvular calcification associated with aortic stenosis. 3. Aortic atherosclerosis. 4. Moderate coronary artery calcification.  02-15-16: TEE: - Left ventricle: The cavity size was normal. There was mild hypertrophy of the septum with moderate hypertrophy of the posterior wall. Systolic function was normal. The estimated ejection fraction was in the range of 50% to 55%. Wall motion was normal; there were no regional wall motion abnormalities. Features are consistent with a pseudonormal left ventricular filling pattern, with concomitant abnormal relaxation and increased filling pressure (grade 2 diastolic dysfunction). Doppler parameters are consistent with high ventricular fillingpressure. - Aortic valve: Transvalvular velocity was within the normal range. There was no stenosis. There was no regurgitation. - Mitral valve: Transvalvular velocity was within the normal range. There was no evidence for stenosis. There was no regurgitation. - Right ventricle: The cavity size was normal. Wall thickness was normal. Systolic function was normal. - Tricuspid valve: There was trivial regurgitation. - Pulmonary arteries: Systolic pressure was within the normal   range. PA peak pressure: 34 mm Hg (S).  05-31-16: chest x-ray: No active cardiopulmonary disease.       LABS REVIEWED:   08-31-15: wbc 10.7; hgb  8.7; hct 27.5; mcv 86.8; plt 453; glucose 163; bun 32; creat 1.42; k+4.1; na++132; liver normal albumin 2.6 08-26-15: wbc 9.1; hgb 8.9; hct 28.5; mcv 86.6; plt 572; glucose 140; bun 26; creat 1.18; k+ 4.4;  na++132 09-01-15: wbc 8.0; hgb 8.7; hct 27.6;mcv 87.9; plt 456; glucose 128; bun 33; creat 1.26; k+ 5.5; na++ 134 09-04-15: glucose 117; bun 24; creat 1.02; k+ 4.7; na++ 134  09-18-15: glucose 166; bun 10.4; creat 0.93; k+ 4.1; na++ 140  11-25-15: wbc 8.9; hgb 6.3; hct 70.6; mcv 76.3; plt 462; glucose 397; bun 14; creat 1.06; k+ 4.1; na++ 129 11-26-15; hgb a1c 7.6; vit B 12: 287; iron 67; tibc 451; ferritin 8; ldh 149  12-27-15: glucose 520; bun 16.1; creat 1.00; k+ 4.6; na++ 134 01-12-16: urine micro-albumin <1.2 01-17-16: uric acid: 8.3 05-31-16: hgb a1c 9.0 06-10-16: wbc 9.3; hgb 8.1; hct 26.8; mcv 81.7; plt 500; glucose 254; bun 6; creat 0.75; k+ 4.4; na++ 134; liver normal albumin 3.0; blood cultures: no growth 06-12-16: wbc 9.5; hgb 7.0; hct 23.9; mcv 82.3; plt 466 06-13-16: wbc 9.7; hgb 7.1; hct 23.9; mcv 83.6; plt 506; glucose 214; bun 16; creat 1.24; k+ 4.1; n++ 130 06-16-16: wbc 15.8; hgb 7.1; hgb 23.4; mcv 89.3; plt 494; glucose 61; bun 11; creat 1.08'; k+ 3.6; na++ 132  06-17-16: wbc 11.6; hgb 6.9; hct 22.7; mcv 84.1; plt 528; glucose 84; bun 14; creat 1.26; k+ 4.0; na++ 134    Review of Systems  Constitutional: Negative for malaise/fatigue.  Respiratory: Negative for cough and shortness of breath.   Cardiovascular: Negative for chest pain, palpitations has right lower extremity edema  Gastrointestinal: Negative for abdominal pain, constipation and heartburn.  Musculoskeletal: has right lower extremity pain .  Skin: Negative.   Neurological: Negative for dizziness.  Psychiatric/Behavioral: The patient is not nervous/anxious.     Physical Exam  Constitutional: No distress.  Eyes: Conjunctivae are normal.  Neck: Neck supple. No JVD present. No thyromegaly present.  Cardiovascular: Normal  rate, regular rhythm and intact distal pulses.   Respiratory: Effort normal and breath sounds normal. No respiratory distress. He has no wheezes.  GI: Soft. Bowel sounds are normal. He exhibits no distension. There is no tenderness.  Musculoskeletal: 2+ right lower extremity edema   Able to move all extremities Is status post right transmetatarsal amputation dressing intact.   Status post left bka  Lymphadenopathy:    He has no cervical adenopathy.  Neurological: He is alert.  Skin: Skin is warm and dry. He is not diaphoretic.  Psychiatric: He has a normal mood and affect.     ASSESSMENT/ PLAN:  1. Chronic diastolic heart failure: EF 50-55% (02-15-16): will continue demadex 20 mg daily coreg 3.125 mg twice daily lisinopril 5 mg daily   2. Hypertension: will continue coreg 3.125 mg twice daily lisinopril 5 mg daily will have nursing check blood pressure twice daily for one week and report.   3. Diabetes: hgb a1c 9.0; will continue lantus 50 units nightly and novolog 22 units with meals; metformin 500 mg daily   4. constipation: will continue miralax daily senna s twice daily   5. Gout: no report of recent flare: will continue allopurinol 200 mg daily   6. Anemia: hgb 8.1; will continue iron three times daily   7. Dyslipidemia: will continue zocor 20 mg daily   8. BPH: will continue flomax 0.4 mg daily   9. Right lower extremity pain: will continue neurontin 600 mg twice daily and 1200 mg nightly. Will continue robaxin 500 mg every 6 hours as needed; will continue oxycodone 10 or 15 mg every 4 hours as needed; will begin ms contin 15 mg twice daily     Time spent  with patient  50   minutes >50% time spent counseling; reviewing medical record; tests; labs; and developing future plan of care   MD is aware of resident's narcotic use and is in agreement with current plan of care. We will attempt to wean resident as apropriate     Ok Edwards NP [facility]  Contact  [phone] Monday through Friday 8am- 5pm  After hours call [phone]

## 2016-06-18 NOTE — Addendum Note (Signed)
Addended by: Lianne Cure A on: 06/18/2016 04:28 PM   Modules accepted: Orders

## 2016-06-19 ENCOUNTER — Encounter: Payer: Self-pay | Admitting: Adult Health

## 2016-06-19 ENCOUNTER — Other Ambulatory Visit: Payer: Self-pay

## 2016-06-19 ENCOUNTER — Non-Acute Institutional Stay (SKILLED_NURSING_FACILITY): Payer: Medicaid Other | Admitting: Adult Health

## 2016-06-19 DIAGNOSIS — I998 Other disorder of circulatory system: Secondary | ICD-10-CM

## 2016-06-19 MED ORDER — MORPHINE SULFATE ER 15 MG PO TBCR: 15.0000 mg | EXTENDED_RELEASE_TABLET | Freq: Two times a day (BID) | ORAL | 0 refills | Status: DC

## 2016-06-19 MED ORDER — OXYCODONE HCL 10 MG PO TABS: 10.0000 mg | ORAL_TABLET | ORAL | 0 refills | Status: DC | PRN

## 2016-06-19 NOTE — Progress Notes (Signed)
Location:   [facility] [place] Room Number: 132 A Place of Service:  SNF (31)   CODE STATUS: Full Code  No Known Allergies  Chief Complaint  Patient presents with  . Acute Visit    Stump Management    HPI:  The nursing staff is concerned about his right foot transmetatarsal amputation stump. They report that he has increased amount of drainage present. There is no warmth or redness to the area. There are no reports of fever present.   Past Medical History:  Diagnosis Date  . Anemia   . Asthma   . BPH (benign prostatic hyperplasia)   . Cellulitis and abscess of foot 07/2015  . Chronic kidney disease    CKD stage 3  . CVA (cerebral vascular accident) ([place]) 2015   denies residual on 08/15/2015  . Hepatitis C    states he was diagnosed in 2007 or 2007 while living in [place], [place]  . Hyperlipidemia   . Hypertension   . Ischemia of foot 05/31/2016  . Peripheral neuropathy   . Pneumonia X 1  . PVD (peripheral vascular disease) ([place])   . Type II diabetes mellitus ([place])   . 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 ABDOMINAL AORTOGRAM N/A 06/03/2016   Procedure: Abdominal Aortogram;  Surgeon: Waynetta Sandy, MD;  Location: [facility];  Service: Cardiovascular;  Laterality: N/A;  . AMPUTATION Left 08/16/2015   Procedure: LEFT BELOW THE KNEE AMPUTATION ;  Surgeon: Serafina Mitchell, MD;  Location: [facility];  Service: Vascular;  Laterality: Left;  . BACK SURGERY    . BELOW KNEE LEG AMPUTATION Left 08/16/2015  . BIOPSY N/A 09/03/2012   Procedure: BIOPSY;  Surgeon: Daneil Dolin, MD;  Location: [facility];  Service: Endoscopy;  Laterality: N/A;  gastric and gastric mucosa  . BIOPSY N/A 12/03/2012   Procedure: BIOPSY;  Surgeon: Daneil Dolin, MD;  Location: [facility];  Service: Endoscopy;  Laterality: N/A;  . COLONOSCOPY WITH PROPOFOL N/A 09/03/2012   [id number] polyp-removed as outlined above. Prominent internal hemorrhoids.  Tubular adenoma  . ESOPHAGOGASTRODUODENOSCOPY (EGD) WITH PROPOFOL N/A 09/03/2012   [id number] hernia. Gastric diverticulum. Gastric ulcers with associated erosions. Duodenal erosions. Status post gastric biopsy. H.PYLORI gastritis   . ESOPHAGOGASTRODUODENOSCOPY (EGD) WITH PROPOFOL N/A 12/03/2012   Dr. Gala Romney: gastric diverticulum, gastric erosions and scar. Previously noted gastric ulcer completed healed. Biopsy without H.pylori.   . ESOPHAGOGASTRODUODENOSCOPY (EGD) WITH PROPOFOL N/A 01/23/2016   Procedure: ESOPHAGOGASTRODUODENOSCOPY (EGD) WITH PROPOFOL;  Surgeon: Doran Stabler, MD;  Location: [facility];  Service: Gastroenterology;  Laterality: N/A;  . LIVER BIOPSY  2005   Done in [place], [place]. Chronic hepatitis with mild periportal inflammation, lobular unicellular necrosis and portal fibrosis. Grade 2, stage 1-2.  . LOWER EXTREMITY ANGIOGRAPHY Right 06/03/2016   Procedure: Lower Extremity Angiography;  Surgeon: Waynetta Sandy, MD;  Location: [facility];  Service: Cardiovascular;  Laterality: Right;  . MAXIMUM ACCESS (MAS)POSTERIOR LUMBAR INTERBODY FUSION (PLIF) 1 LEVEL N/A 11/25/2013   Procedure: FOR MAXIMUM ACCESS (MAS) POSTERIOR LUMBAR INTERBODY FUSION (PLIF) 1 LEVEL;  Surgeon: Eustace Moore, MD;  Location: [facility];  Service: Neurosurgery;  Laterality: N/A;  FOR MAXIMUM ACCESS (MAS) POSTERIOR LUMBAR INTERBODY FUSION (PLIF) 1 LEVEL LUMBAR 3-4  . PERIPHERAL VASCULAR ATHERECTOMY Right 06/03/2016   Procedure: Peripheral Vascular Atherectomy;  Surgeon: Waynetta Sandy, MD;  Location: [facility];  Service: Cardiovascular;  Laterality:  Right;  SFA WITH STENT  . PERIPHERAL VASCULAR CATHETERIZATION Left 08/01/2015   Procedure: Lower Extremity Angiography;  Surgeon: Serafina Mitchell, MD;  Location: [facility];  Service: Cardiovascular;  Laterality: Left;  . PERIPHERAL VASCULAR CATHETERIZATION N/A 08/01/2015   Procedure: Abdominal Aortogram;  Surgeon:  Serafina Mitchell, MD;  Location: [facility];  Service: Cardiovascular;  Laterality: N/A;  . PERIPHERAL VASCULAR CATHETERIZATION N/A 08/08/2015   Procedure: Abdominal Aortogram w/Lower Extremity;  Surgeon: Serafina Mitchell, MD;  Location: [facility];  Service: Cardiovascular;  Laterality: N/A;  . PERIPHERAL VASCULAR CATHETERIZATION Left 08/08/2015   Procedure: Peripheral Vascular Balloon Angioplasty;  Surgeon: Serafina Mitchell, MD;  Location: [facility];  Service: Cardiovascular;  Laterality: Left;  left popiteal artery, left peronealtrunk, left post tibial  . POLYPECTOMY N/A 09/03/2012   Procedure: POLYPECTOMY;  Surgeon: Daneil Dolin, MD;  [facility];  Service: Endoscopy;  Laterality: N/A;  cecal polyp  . TONSILLECTOMY    . TRANSMETATARSAL AMPUTATION Right 06/13/2016   Procedure: RIGHT TRANSMETATARSAL AMPUTATION;  Surgeon: Waynetta Sandy, MD;  Location: [facility];  Service: Vascular;  Laterality: Right;    Social History   Social History  . Marital status: Divorced    Spouse name: N/A  . Number of children: 1  . Years of education: N/A   Occupational History  . trying to get disability    Social History Main Topics  . Smoking status: Former Smoker    Packs/day: 1.00    Years: 47.00    Types: Cigarettes    Quit date: 07/13/2015  . Smokeless tobacco: Never Used  . Alcohol use No     Comment:  none 08/15/2015 "stopped drinking 3-4 years ago"  . Drug use: No     Comment: 08/15/2015 "last used cocaine 3-4 months ago"  . Sexual activity: No   Other Topics Concern  . Not on file   Social History Narrative  . No narrative on file   Family History  Problem Relation Age of Onset  . Breast cancer Mother   . Heart failure Mother   . Diabetes Father   . Hypertension Father   . Heart disease Father   . Hyperlipidemia Father   . Diabetes Sister   . Hypertension Sister   . Breast cancer Sister   . Colon cancer Neg Hx   . Liver disease Neg Hx        VITAL SIGNS BP (!) 149/101   Pulse (!) 110   Temp 97.5 F (36.4 C)   Resp 20   Ht 5\' 10"  (1.778 m)   Wt 190 lb (86.2 kg)   SpO2 94%   BMI 27.26 kg/m   Patient's Medications  New Prescriptions   No medications on file  Previous Medications   ALLOPURINOL (ZYLOPRIM) 100 MG TABLET    Take 200 mg by mouth daily after supper.    ASPIRIN 325 MG TABLET    Take 325 mg by mouth daily.   CARVEDILOL (COREG) 3.125 MG TABLET    Take 1 tablet (3.125 mg total) by mouth 2 (two) times daily with a meal.   CLOPIDOGREL (PLAVIX) 75 MG TABLET    Take 1 tablet (75 mg total) by mouth daily with breakfast.   FERROUS SULFATE 325 (65 FE) MG TABLET    Take 325 mg by mouth 3 (three) times daily with meals.   GABAPENTIN (NEURONTIN) 600 MG TABLET    Take 600-1,200 mg by  mouth See admin instructions. Take 1 tablet (600 mg) by mouth twice daily - morning and afternoon and take 2 tablets (1200 mg) at bedtime   INSULIN ASPART (NOVOLOG) 100 UNIT/ML INJECTION    Inject 22 Units into the skin 3 (three) times daily before meals.    INSULIN GLARGINE (LANTUS) 100 UNIT/ML INJECTION    Inject 0.5 mLs (50 Units total) into the skin at bedtime.   LISINOPRIL (PRINIVIL,ZESTRIL) 5 MG TABLET    Take 5 mg by mouth daily.    MAGNESIUM HYDROXIDE (MILK OF MAGNESIA) 400 MG/5ML SUSPENSION    Take 30 mLs by mouth daily as needed for mild constipation.   METFORMIN (GLUCOPHAGE) 500 MG TABLET    Take 500 mg by mouth daily with breakfast.   METHOCARBAMOL (ROBAXIN) 500 MG TABLET    Take 500 mg by mouth every 6 (six) hours as needed for muscle spasms.    MORPHINE (MS CONTIN) 15 MG 12 HR TABLET    Take 1 tablet (15 mg total) by mouth every 12 (twelve) hours.   OXYCODONE HCL 10 MG TABS    Take 1-1.5 tablets (10-15 mg total) by mouth every 4 (four) hours as needed.   POLYETHYLENE GLYCOL (MIRALAX / GLYCOLAX) PACKET    Take 17 g by mouth daily.   SENNA-DOCUSATE (SENOKOT-S) 8.6-50 MG TABLET    Take 1 tablet by mouth 2 (two) times daily.    SIMVASTATIN (ZOCOR) 20 MG TABLET    Take 20 mg by mouth daily.    TAMSULOSIN (FLOMAX) 0.4 MG CAPS CAPSULE    Take 1 capsule (0.4 mg total) by mouth daily.   TORSEMIDE (DEMADEX) 20 MG TABLET    Take 20 mg by mouth daily.   Modified Medications   No medications on file  Discontinued Medications   No medications on file     SIGNIFICANT DIAGNOSTIC EXAMS   11-26-15: ct of chest: 1. Clusters of nodules and "tree-in-bud" opacities in the right greater than left lung apices compatible with bronchiolitis. 2. Aortic valvular calcification associated with aortic stenosis. 3. Aortic atherosclerosis. 4. Moderate coronary artery calcification.  02-15-16: TEE: - Left ventricle: The cavity size was normal. There was mild hypertrophy of the septum with moderate hypertrophy of the posterior wall. Systolic function was normal. The estimated ejection fraction was in the range of 50% to 55%. Wall motion was normal; there were no regional wall motion abnormalities. Features are consistent with a pseudonormal left ventricular filling pattern, with concomitant abnormal relaxation and increased filling pressure (grade 2 diastolic dysfunction). Doppler parameters are consistent with high ventricular fillingpressure. - Aortic valve: Transvalvular velocity was within the normal range. There was no stenosis. There was no regurgitation. - Mitral valve: Transvalvular velocity was within the normal range. There was no evidence for stenosis. There was no regurgitation. - Right ventricle: The cavity size was normal. Wall thickness was normal. Systolic function was normal. - Tricuspid valve: There was trivial regurgitation. - Pulmonary arteries: Systolic pressure was within the normal   range. PA peak pressure: 34 mm Hg (S).  05-31-16: chest x-ray: No active cardiopulmonary disease.       LABS REVIEWED:   08-31-15: wbc 10.7; hgb 8.7; hct 27.5; mcv 86.8; plt 453; glucose 163; bun 32; creat 1.42; k+4.1; na++132; liver  normal albumin 2.6 08-26-15: wbc 9.1; hgb 8.9; hct 28.5; mcv 86.6; plt 572; glucose 140; bun 26; creat 1.18; k+ 4.4; na++132 09-01-15: wbc 8.0; hgb 8.7; hct 27.6;mcv 87.9; plt 456; glucose 128; bun 33; creat 1.26;  k+ 5.5; na++ 134 09-04-15: glucose 117; bun 24; creat 1.02; k+ 4.7; na++ 134  09-18-15: glucose 166; bun 10.4; creat 0.93; k+ 4.1; na++ 140  11-25-15: wbc 8.9; hgb 6.3; hct 70.6; mcv 76.3; plt 462; glucose 397; bun 14; creat 1.06; k+ 4.1; na++ 129 11-26-15; hgb a1c 7.6; vit B 12: 287; iron 67; tibc 451; ferritin 8; ldh 149  12-27-15: glucose 520; bun 16.1; creat 1.00; k+ 4.6; na++ 134 01-12-16: urine micro-albumin <1.2 01-17-16: uric acid: 8.3 05-31-16: hgb a1c 9.0 06-10-16: wbc 9.3; hgb 8.1; hct 26.8; mcv 81.7; plt 500; glucose 254; bun 6; creat 0.75; k+ 4.4; na++ 134; liver normal albumin 3.0; blood cultures: no growth 06-12-16: wbc 9.5; hgb 7.0; hct 23.9; mcv 82.3; plt 466 06-13-16: wbc 9.7; hgb 7.1; hct 23.9; mcv 83.6; plt 506; glucose 214; bun 16; creat 1.24; k+ 4.1; n++ 130 06-16-16: wbc 15.8; hgb 7.1; hgb 23.4; mcv 89.3; plt 494; glucose 61; bun 11; creat 1.08'; k+ 3.6; na++ 132  06-17-16: wbc 11.6; hgb 6.9; hct 22.7; mcv 84.1; plt 528; glucose 84; bun 14; creat 1.26; k+ 4.0; na++ 134    Review of Systems  Constitutional: Negative for malaise/fatigue.  Respiratory: Negative for cough and shortness of breath.   Cardiovascular: Negative for chest pain, palpitations has right lower extremity edema  Gastrointestinal: Negative for abdominal pain, constipation and heartburn.  Musculoskeletal: has right lower extremity pain .  Skin: Negative.   Neurological: Negative for dizziness.  Psychiatric/Behavioral: The patient is not nervous/anxious.     Physical Exam  Constitutional: No distress.  Eyes: Conjunctivae are normal.  Neck: Neck supple. No JVD present. No thyromegaly present.  Cardiovascular: Normal rate, regular rhythm and intact distal pulses.   Respiratory: Effort normal and breath  sounds normal. No respiratory distress. He has no wheezes.  GI: Soft. Bowel sounds are normal. He exhibits no distension. There is no tenderness.  Musculoskeletal: 2+ right lower extremity edema   Able to move all extremities Is status post right transmetatarsal amputation  Status post left bka  Lymphadenopathy:    He has no cervical adenopathy.  Neurological: He is alert.  Skin: Skin is warm and dry. He is not diaphoretic.  Psychiatric: He has a normal mood and affect.  Right transmetatarsal amputation stump: large amount of serous drainage present. The incision has maceration present. There is no redness or warmth present. Suture line is intact.     ASSESSMENT/ PLAN:  1. Right foot stump: will get a venous doppler due to his increased edema. I have encouraged him to elevate his right leg as much as possible.    MD is aware of resident's narcotic use and is in agreement with current plan of care. We will attempt to wean resident as apropriate   Ok Edwards NP [facility]  Contact [phone] Monday through Friday 8am- 5pm  After hours call [phone]

## 2016-06-19 NOTE — Progress Notes (Signed)
Entered in Error

## 2016-06-19 NOTE — Telephone Encounter (Signed)
RX faxed to [facility] fax # [phone] Phone #[phone] 

## 2016-06-20 ENCOUNTER — Telehealth: Payer: Self-pay | Admitting: *Deleted

## 2016-06-20 ENCOUNTER — Encounter: Payer: Self-pay | Admitting: Vascular Surgery

## 2016-06-20 NOTE — Telephone Encounter (Signed)
I spoke to Seth Bake, nurse at [facility] [phone]) regarding Mr. Navarez's right TMA incision line. Patient is having a great deal of edema even though they are trying to elevate. The suture line is tenuous and now the bottom of his foot is "boggy" and draining more. Right TMA was done on 06-13-16 by Dr. Donzetta Matters. I have made the patient an appt in the morning to see Dr. Donzetta Matters in our office. Seth Bake will arrange transportation.

## 2016-06-21 ENCOUNTER — Encounter: Payer: Self-pay | Admitting: Adult Health

## 2016-06-21 ENCOUNTER — Encounter: Payer: Self-pay | Admitting: Vascular Surgery

## 2016-06-21 ENCOUNTER — Ambulatory Visit (INDEPENDENT_AMBULATORY_CARE_PROVIDER_SITE_OTHER): Payer: Self-pay | Admitting: Vascular Surgery

## 2016-06-21 ENCOUNTER — Non-Acute Institutional Stay (SKILLED_NURSING_FACILITY): Payer: Medicaid Other | Admitting: Adult Health

## 2016-06-21 VITALS — BP 126/74 | HR 93 | Temp 97.6°F | Ht 73.0 in

## 2016-06-21 DIAGNOSIS — E1165 Type 2 diabetes mellitus with hyperglycemia: Secondary | ICD-10-CM

## 2016-06-21 DIAGNOSIS — I7025 Atherosclerosis of native arteries of other extremities with ulceration: Secondary | ICD-10-CM

## 2016-06-21 DIAGNOSIS — E1143 Type 2 diabetes mellitus with diabetic autonomic (poly)neuropathy: Secondary | ICD-10-CM | POA: Diagnosis not present

## 2016-06-21 DIAGNOSIS — IMO0002 Reserved for concepts with insufficient information to code with codable children: Secondary | ICD-10-CM

## 2016-06-21 NOTE — Progress Notes (Signed)
Location:   [facility] Room Number: 132 A Place of Service:  SNF (31)   CODE STATUS: Full Code  No Known Allergies  Chief Complaint  Patient presents with  . Acute Visit    Fluctuation in blood sugar readings    HPI:  He did have a low cbg yesterday of 37; he did not lose consciousness he ate and did recover from his low cbg. His reading since that time have been elevated. He is worried about this low cbg; he wants to prevent this from happening in the future.    Past Medical History:  Diagnosis Date  . Anemia   . Asthma   . BPH (benign prostatic hyperplasia)   . Cellulitis and abscess of foot 07/2015  . Chronic kidney disease    CKD stage 3  . CVA (cerebral vascular accident) ([place]) 2015   denies residual on 08/15/2015  . Hepatitis C    states he was diagnosed in 2007 or 2007 while living in [place], [place]  . Hyperlipidemia   . Hypertension   . Ischemia of foot 05/31/2016  . Peripheral neuropathy   . Pneumonia X 1  . PVD (peripheral vascular disease) ([place])   . Type II diabetes mellitus ([place])   . Wet gangrene ([place]) 06/10/2016    Past Surgical History:  Procedure Laterality Date  . ABDOMINAL AORTOGRAM N/A 06/03/2016   Procedure: Abdominal Aortogram;  Surgeon: Waynetta Sandy, MD;  Location: [facility];  Service: Cardiovascular;  Laterality: N/A;  . AMPUTATION Left 08/16/2015   Procedure: LEFT BELOW THE KNEE AMPUTATION ;  Surgeon: Serafina Mitchell, MD;  Location: [facility];  Service: Vascular;  Laterality: Left;  . BACK SURGERY    . BELOW KNEE LEG AMPUTATION Left 08/16/2015  . BIOPSY N/A 09/03/2012   Procedure: BIOPSY;  Surgeon: Daneil Dolin, MD;  Location: [facility];  Service: Endoscopy;  Laterality: N/A;  gastric and gastric mucosa  . BIOPSY N/A 12/03/2012   Procedure: BIOPSY;  Surgeon: Daneil Dolin, MD;  Location: [facility];  Service: Endoscopy;  Laterality: N/A;  . COLONOSCOPY WITH PROPOFOL N/A 09/03/2012   WCB:JSEGBTD polyp-removed as outlined  above. Prominent internal hemorrhoids. Tubular adenoma  . ESOPHAGOGASTRODUODENOSCOPY (EGD) WITH PROPOFOL N/A 09/03/2012   VVO:HYWVPX hernia. Gastric diverticulum. Gastric ulcers with associated erosions. Duodenal erosions. Status post gastric biopsy. H.PYLORI gastritis   . ESOPHAGOGASTRODUODENOSCOPY (EGD) WITH PROPOFOL N/A 12/03/2012   Dr. Gala Romney: gastric diverticulum, gastric erosions and scar. Previously noted gastric ulcer completed healed. Biopsy without H.pylori.   . ESOPHAGOGASTRODUODENOSCOPY (EGD) WITH PROPOFOL N/A 01/23/2016   Procedure: ESOPHAGOGASTRODUODENOSCOPY (EGD) WITH PROPOFOL;  Surgeon: Doran Stabler, MD;  Location: [facility];  Service: Gastroenterology;  Laterality: N/A;  . LIVER BIOPSY  2005   Done in [place], [place]. Chronic hepatitis with mild periportal inflammation, lobular unicellular necrosis and portal fibrosis. Grade 2, stage 1-2.  . LOWER EXTREMITY ANGIOGRAPHY Right 06/03/2016   Procedure: Lower Extremity Angiography;  Surgeon: Waynetta Sandy, MD;  Location: [facility];  Service: Cardiovascular;  Laterality: Right;  . MAXIMUM ACCESS (MAS)POSTERIOR LUMBAR INTERBODY FUSION (PLIF) 1 LEVEL N/A 11/25/2013   Procedure: FOR MAXIMUM ACCESS (MAS) POSTERIOR LUMBAR INTERBODY FUSION (PLIF) 1 LEVEL;  Surgeon: Eustace Moore, MD;  Location: [facility];  Service: Neurosurgery;  Laterality: N/A;  FOR MAXIMUM ACCESS (MAS) POSTERIOR LUMBAR INTERBODY FUSION (PLIF) 1 LEVEL LUMBAR 3-4  . PERIPHERAL VASCULAR ATHERECTOMY Right 06/03/2016   Procedure: Peripheral Vascular Atherectomy;  Surgeon: Erlene Quan  Dione Plover, MD;  Location: [facility];  Service: Cardiovascular;  Laterality: Right;  SFA WITH STENT  . PERIPHERAL VASCULAR CATHETERIZATION Left 08/01/2015   Procedure: Lower Extremity Angiography;  Surgeon: Serafina Mitchell, MD;  Location: [facility];  Service: Cardiovascular;  Laterality: Left;  . PERIPHERAL VASCULAR CATHETERIZATION N/A 08/01/2015    Procedure: Abdominal Aortogram;  Surgeon: Serafina Mitchell, MD;  Location: [facility];  Service: Cardiovascular;  Laterality: N/A;  . PERIPHERAL VASCULAR CATHETERIZATION N/A 08/08/2015   Procedure: Abdominal Aortogram w/Lower Extremity;  Surgeon: Serafina Mitchell, MD;  Location: [facility];  Service: Cardiovascular;  Laterality: N/A;  . PERIPHERAL VASCULAR CATHETERIZATION Left 08/08/2015   Procedure: Peripheral Vascular Balloon Angioplasty;  Surgeon: Serafina Mitchell, MD;  Location: [facility];  Service: Cardiovascular;  Laterality: Left;  left popiteal artery, left peronealtrunk, left post tibial  . POLYPECTOMY N/A 09/03/2012   Procedure: POLYPECTOMY;  Surgeon: Daneil Dolin, MD;  [facility];  Service: Endoscopy;  Laterality: N/A;  cecal polyp  . TONSILLECTOMY    . TRANSMETATARSAL AMPUTATION Right 06/13/2016   Procedure: RIGHT TRANSMETATARSAL AMPUTATION;  Surgeon: Waynetta Sandy, MD;  [facility];  Service: Vascular;  Laterality: Right;    Social History   Social History  . Marital status: Divorced    Spouse name: N/A  . Number of children: 1  . Years of education: N/A   Occupational History  . trying to get disability    Social History Main Topics  . Smoking status: Former Smoker    Packs/day: 1.00    Years: 47.00    Types: Cigarettes    Quit date: 07/13/2015  . Smokeless tobacco: Never Used  . Alcohol use No     Comment:  none 08/15/2015 "stopped drinking 3-4 years ago"  . Drug use: No     Comment: 08/15/2015 "last used cocaine 3-4 months ago"  . Sexual activity: No   Other Topics Concern  . Not on file   Social History Narrative  . No narrative on file   Family History  Problem Relation Age of Onset  . Breast cancer Mother   . Heart failure Mother   . Diabetes Father   . Hypertension Father   . Heart disease Father   . Hyperlipidemia Father   . Diabetes Sister   . Hypertension Sister   . Breast cancer Sister   . Colon cancer Neg  Hx   . Liver disease Neg Hx       VITAL SIGNS BP 126/74   Pulse 93   Temp 97.6 F (36.4 C)   Resp 20   Ht 6\' 1"  (1.854 m)   Wt 190 lb (86.2 kg)   SpO2 95%   BMI 25.07 kg/m   Patient's Medications  New Prescriptions   No medications on file  Previous Medications   ALLOPURINOL (ZYLOPRIM) 100 MG TABLET    Take 200 mg by mouth daily after supper.    ASPIRIN 325 MG TABLET    Take 325 mg by mouth daily.   CARVEDILOL (COREG) 3.125 MG TABLET    Take 1 tablet (3.125 mg total) by mouth 2 (two) times daily with a meal.   CLOPIDOGREL (PLAVIX) 75 MG TABLET    Take 1 tablet (75 mg total) by mouth daily with breakfast.   FERROUS SULFATE 325 (65 FE) MG TABLET    Take 325 mg by mouth 3 (three) times daily with meals.  GABAPENTIN (NEURONTIN) 600 MG TABLET    Take 600-1,200 mg by mouth See admin instructions. Take 1 tablet (600 mg) by mouth twice daily - morning and afternoon and take 2 tablets (1200 mg) at bedtime   INSULIN ASPART (NOVOLOG) 100 UNIT/ML INJECTION    Inject 22 Units into the skin 3 (three) times daily before meals.    INSULIN GLARGINE (LANTUS) 100 UNIT/ML INJECTION    Inject 0.5 mLs (50 Units total) into the skin at bedtime.   LISINOPRIL (PRINIVIL,ZESTRIL) 5 MG TABLET    Take 5 mg by mouth daily.    MAGNESIUM HYDROXIDE (MILK OF MAGNESIA) 400 MG/5ML SUSPENSION    Take 30 mLs by mouth daily as needed for mild constipation.   METFORMIN (GLUCOPHAGE) 500 MG TABLET    Take 500 mg by mouth daily with breakfast.   METHOCARBAMOL (ROBAXIN) 500 MG TABLET    Take 500 mg by mouth every 6 (six) hours as needed for muscle spasms.    MORPHINE (MS CONTIN) 15 MG 12 HR TABLET    Take 1 tablet (15 mg total) by mouth every 12 (twelve) hours.   OXYCODONE HCL 10 MG TABS    Take 1-1.5 tablets (10-15 mg total) by mouth every 4 (four) hours as needed.   POLYETHYLENE GLYCOL (MIRALAX / GLYCOLAX) PACKET    Take 17 g by mouth daily.   SENNA-DOCUSATE (SENOKOT-S) 8.6-50 MG TABLET    Take 1 tablet by mouth 2 (two)  times daily.   SIMVASTATIN (ZOCOR) 20 MG TABLET    Take 20 mg by mouth daily.    TAMSULOSIN (FLOMAX) 0.4 MG CAPS CAPSULE    Take 1 capsule (0.4 mg total) by mouth daily.   TORSEMIDE (DEMADEX) 20 MG TABLET    Take 20 mg by mouth daily.   Modified Medications   No medications on file  Discontinued Medications   No medications on file     SIGNIFICANT DIAGNOSTIC EXAMS   11-26-15: ct of chest: 1. Clusters of nodules and "tree-in-bud" opacities in the right greater than left lung apices compatible with bronchiolitis. 2. Aortic valvular calcification associated with aortic stenosis. 3. Aortic atherosclerosis. 4. Moderate coronary artery calcification.  02-15-16: TEE: - Left ventricle: The cavity size was normal. There was mild hypertrophy of the septum with moderate hypertrophy of the posterior wall. Systolic function was normal. The estimated ejection fraction was in the range of 50% to 55%. Wall motion was normal; there were no regional wall motion abnormalities. Features are consistent with a pseudonormal left ventricular filling pattern, with concomitant abnormal relaxation and increased filling pressure (grade 2 diastolic dysfunction). Doppler parameters are consistent with high ventricular fillingpressure. - Aortic valve: Transvalvular velocity was within the normal range. There was no stenosis. There was no regurgitation. - Mitral valve: Transvalvular velocity was within the normal range. There was no evidence for stenosis. There was no regurgitation. - Right ventricle: The cavity size was normal. Wall thickness was normal. Systolic function was normal. - Tricuspid valve: There was trivial regurgitation. - Pulmonary arteries: Systolic pressure was within the normal   range. PA peak pressure: 34 mm Hg (S).  05-31-16: chest x-ray: No active cardiopulmonary disease.       LABS REVIEWED:   08-31-15: wbc 10.7; hgb 8.7; hct 27.5; mcv 86.8; plt 453; glucose 163; bun 32; creat 1.42; k+4.1;  na++132; liver normal albumin 2.6 08-26-15: wbc 9.1; hgb 8.9; hct 28.5; mcv 86.6; plt 572; glucose 140; bun 26; creat 1.18; k+ 4.4; na++132 09-01-15: wbc 8.0; hgb  8.7; hct 27.6;mcv 87.9; plt 456; glucose 128; bun 33; creat 1.26; k+ 5.5; na++ 134 09-04-15: glucose 117; bun 24; creat 1.02; k+ 4.7; na++ 134  09-18-15: glucose 166; bun 10.4; creat 0.93; k+ 4.1; na++ 140  11-25-15: wbc 8.9; hgb 6.3; hct 70.6; mcv 76.3; plt 462; glucose 397; bun 14; creat 1.06; k+ 4.1; na++ 129 11-26-15; hgb a1c 7.6; vit B 12: 287; iron 67; tibc 451; ferritin 8; ldh 149  12-27-15: glucose 520; bun 16.1; creat 1.00; k+ 4.6; na++ 134 01-12-16: urine micro-albumin <1.2 01-17-16: uric acid: 8.3 05-31-16: hgb a1c 9.0 06-10-16: wbc 9.3; hgb 8.1; hct 26.8; mcv 81.7; plt 500; glucose 254; bun 6; creat 0.75; k+ 4.4; na++ 134; liver normal albumin 3.0; blood cultures: no growth 06-12-16: wbc 9.5; hgb 7.0; hct 23.9; mcv 82.3; plt 466 06-13-16: wbc 9.7; hgb 7.1; hct 23.9; mcv 83.6; plt 506; glucose 214; bun 16; creat 1.24; k+ 4.1; n++ 130 06-16-16: wbc 15.8; hgb 7.1; hgb 23.4; mcv 89.3; plt 494; glucose 61; bun 11; creat 1.08'; k+ 3.6; na++ 132  06-17-16: wbc 11.6; hgb 6.9; hct 22.7; mcv 84.1; plt 528; glucose 84; bun 14; creat 1.26; k+ 4.0; na++ 134    Review of Systems  Constitutional: Negative for malaise/fatigue.  Respiratory: Negative for cough and shortness of breath.   Cardiovascular: Negative for chest pain, palpitations has right lower extremity edema  Gastrointestinal: Negative for abdominal pain, constipation and heartburn.  Musculoskeletal: has right lower extremity pain .  Skin: Negative.   Neurological: Negative for dizziness.  Psychiatric/Behavioral: The patient is not nervous/anxious.     Physical Exam  Constitutional: No distress.  Eyes: Conjunctivae are normal.  Neck: Neck supple. No JVD present. No thyromegaly present.  Cardiovascular: Normal rate, regular rhythm and intact distal pulses.   Respiratory: Effort  normal and breath sounds normal. No respiratory distress. He has no wheezes.  GI: Soft. Bowel sounds are normal. He exhibits no distension. There is no tenderness.  Musculoskeletal: 2+ right lower extremity edema   Able to move all extremities Is status post right transmetatarsal amputation  Status post left bka  Lymphadenopathy:    He has no cervical adenopathy.  Neurological: He is alert.  Skin: Skin is warm and dry. He is not diaphoretic.  Psychiatric: He has a normal mood and affect.  Right transmetatarsal amputation stump: ace wrap in place.   ASSESSMENT/ PLAN:  1. Diabetes: hgb a1c 9.0; will continue lantus 50 units nightly  metformin 500 mg daily will change to novolog 18 units after meals. Will monitor     MD is aware of resident's narcotic use and is in agreement with current plan of care. We will attempt to wean resident as apropriate   Ok Edwards NP [facility]  Contact [phone] Monday through Friday 8am- 5pm  After hours call [phone]

## 2016-06-21 NOTE — Progress Notes (Signed)
Subjective:     Patient ID: Cory Weber, male   DOB: 22-Nov-1950, 66 y.o.   MRN: [id number]  HPI Cory Weber follows up from recent right-sided transmetatarsal amputation following right SFA stenting. He is now at*mouth and is doing well with daily dressing changes. He is unsure the medications that he is on he has been nonweightbearing is not wearing his postop she is in a wheelchair. He is not having any other issues related to today's visit.   Review of Systems Slow healing tma wound    Objective:   Physical Exam aaox3 Non labored respirations Right tma ends with serous drainage, sutures in place, foot is viable Strong pt signal at ankle Monophasic dp    Assessment/plan     66 year old male returns following right TMA after endovascular revascularization. He should continue his aspirin and Plavix and daily dry dressing changes as well as nonweightbearing status. He will follow-up in 2 weeks and Friday for wound evaluation and we can assess to take his sutures out at that time.  Michael Walrath C. Donzetta Matters, MD [facility] Office: [phone] Pager: [phone]

## 2016-06-26 ENCOUNTER — Encounter: Payer: Self-pay | Admitting: Adult Health

## 2016-06-26 ENCOUNTER — Non-Acute Institutional Stay (SKILLED_NURSING_FACILITY): Payer: Medicaid Other | Admitting: Adult Health

## 2016-06-26 DIAGNOSIS — I739 Peripheral vascular disease, unspecified: Secondary | ICD-10-CM | POA: Diagnosis not present

## 2016-06-26 DIAGNOSIS — I998 Other disorder of circulatory system: Secondary | ICD-10-CM

## 2016-06-26 DIAGNOSIS — Z89431 Acquired absence of right foot: Secondary | ICD-10-CM

## 2016-06-26 NOTE — Progress Notes (Signed)
Location:   [facility] Room Number: 127 A Place of Service:  SNF (31)   CODE STATUS: Full Code  No Known Allergies  Chief Complaint  Patient presents with  . Acute Visit    Stump    HPI:  He did have a fall yesterday without injury. Staff reports that he is having drainage and his incision line is "open". He tells me that a stitch as come out. He denies any worsening pain. There are no reports of fever present.    Past Medical History:  Diagnosis Date  . Anemia   . Asthma   . BPH (benign prostatic hyperplasia)   . Cellulitis and abscess of foot 07/2015  . Chronic kidney disease    CKD stage 3  . CVA (cerebral vascular accident) ([place]) 2015   denies residual on 08/15/2015  . Hepatitis C    states he was diagnosed in 2007 or 2007 while living in [place], [place]  . Hyperlipidemia   . Hypertension   . Ischemia of foot 05/31/2016  . Peripheral neuropathy   . Pneumonia X 1  . PVD (peripheral vascular disease) ([place])   . Type II diabetes mellitus ([place])   . 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 ABDOMINAL AORTOGRAM N/A 06/03/2016   Procedure: Abdominal Aortogram;  Surgeon: Waynetta Sandy, MD;  Location: [facility];  Service: Cardiovascular;  Laterality: N/A;  . AMPUTATION Left 08/16/2015   Procedure: LEFT BELOW THE KNEE AMPUTATION ;  Surgeon: Serafina Mitchell, MD;  Location: [facility];  Service: Vascular;  Laterality: Left;  . BACK SURGERY    . BELOW KNEE LEG AMPUTATION Left 08/16/2015  . BIOPSY N/A 09/03/2012   Procedure: BIOPSY;  Surgeon: Daneil Dolin, MD;  Location: [facility];  Service: Endoscopy;  Laterality: N/A;  gastric and gastric mucosa  . BIOPSY N/A 12/03/2012   Procedure: BIOPSY;  Surgeon: Daneil Dolin, MD;  Location: [facility];  Service: Endoscopy;  Laterality: N/A;  . COLONOSCOPY WITH PROPOFOL N/A 09/03/2012   WUJ:[id number] polyp-removed as outlined above. Prominent internal hemorrhoids. Tubular  adenoma  . ESOPHAGOGASTRODUODENOSCOPY (EGD) WITH PROPOFOL N/A 09/03/2012   WGN:FAOZHY hernia. Gastric diverticulum. Gastric ulcers with associated erosions. Duodenal erosions. Status post gastric biopsy. H.PYLORI gastritis   . ESOPHAGOGASTRODUODENOSCOPY (EGD) WITH PROPOFOL N/A 12/03/2012   Dr. Gala Romney: gastric diverticulum, gastric erosions and scar. Previously noted gastric ulcer completed healed. Biopsy without H.pylori.   . ESOPHAGOGASTRODUODENOSCOPY (EGD) WITH PROPOFOL N/A 01/23/2016   Procedure: ESOPHAGOGASTRODUODENOSCOPY (EGD) WITH PROPOFOL;  Surgeon: Doran Stabler, MD;  Location: [facility];  Service: Gastroenterology;  Laterality: N/A;  . LIVER BIOPSY  2005   Done in [place], [place]. Chronic hepatitis with mild periportal inflammation, lobular unicellular necrosis and portal fibrosis. Grade 2, stage 1-2.  . LOWER EXTREMITY ANGIOGRAPHY Right 06/03/2016   Procedure: Lower Extremity Angiography;  Surgeon: Waynetta Sandy, MD;  Location: [facility];  Service: Cardiovascular;  Laterality: Right;  . MAXIMUM ACCESS (MAS)POSTERIOR LUMBAR INTERBODY FUSION (PLIF) 1 LEVEL N/A 11/25/2013   Procedure: FOR MAXIMUM ACCESS (MAS) POSTERIOR LUMBAR INTERBODY FUSION (PLIF) 1 LEVEL;  Surgeon: Eustace Moore, MD;  Location: [facility];  Service: Neurosurgery;  Laterality: N/A;  FOR MAXIMUM ACCESS (MAS) POSTERIOR LUMBAR INTERBODY FUSION (PLIF) 1 LEVEL LUMBAR 3-4  . PERIPHERAL VASCULAR ATHERECTOMY Right 06/03/2016   Procedure: Peripheral Vascular Atherectomy;  Surgeon: Waynetta Sandy, MD;  Location: [facility];  Service: Cardiovascular;  Laterality: Right;  SFA WITH STENT  . PERIPHERAL VASCULAR CATHETERIZATION Left 08/01/2015   Procedure: Lower Extremity Angiography;  Surgeon: Serafina Mitchell, MD;  Location: [facility];  Service: Cardiovascular;  Laterality: Left;  . PERIPHERAL VASCULAR CATHETERIZATION N/A 08/01/2015   Procedure: Abdominal Aortogram;  Surgeon: Serafina Mitchell, MD;  Location: [facility];  Service: Cardiovascular;  Laterality: N/A;  . PERIPHERAL VASCULAR CATHETERIZATION N/A 08/08/2015   Procedure: Abdominal Aortogram w/Lower Extremity;  Surgeon: Serafina Mitchell, MD;  Location: [facility];  Service: Cardiovascular;  Laterality: N/A;  . PERIPHERAL VASCULAR CATHETERIZATION Left 08/08/2015   Procedure: Peripheral Vascular Balloon Angioplasty;  Surgeon: Serafina Mitchell, MD;  Location: [facility];  Service: Cardiovascular;  Laterality: Left;  left popiteal artery, left peronealtrunk, left post tibial  . POLYPECTOMY N/A 09/03/2012   Procedure: POLYPECTOMY;  Surgeon: Daneil Dolin, MD;  [facility]: [facility];  Service: Endoscopy;  Laterality: N/A;  cecal polyp  . TONSILLECTOMY    . TRANSMETATARSAL AMPUTATION Right 06/13/2016   Procedure: RIGHT TRANSMETATARSAL AMPUTATION;  Surgeon: Waynetta Sandy, MD;  Location: [facility];  Service: Vascular;  Laterality: Right;    Social History   Social History  . Marital status: Divorced    Spouse name: N/A  . Number of children: 1  . Years of education: N/A   Occupational History  . trying to get disability    Social History Main Topics  . Smoking status: Former Smoker    Packs/day: 1.00    Years: 47.00    Types: Cigarettes    Quit date: 07/13/2015  . Smokeless tobacco: Never Used  . Alcohol use No     Comment:  none 08/15/2015 "stopped drinking 3-4 years ago"  . Drug use: No     Comment: 08/15/2015 "last used cocaine 3-4 months ago"  . Sexual activity: No   Other Topics Concern  . Not on file   Social History Narrative  . No narrative on file   Family History  Problem Relation Age of Onset  . Breast cancer Mother   . Heart failure Mother   . Diabetes Father   . Hypertension Father   . Heart disease Father   . Hyperlipidemia Father   . Diabetes Sister   . Hypertension Sister   . Breast cancer Sister   . Colon cancer Neg Hx   . Liver disease Neg Hx       VITAL  SIGNS BP 126/74   Pulse 93   Temp 97.6 F (36.4 C)   Resp 20   Ht 6\' 1"  (1.854 m)   Wt 190 lb (86.2 kg)   SpO2 95%   BMI 25.07 kg/m   Patient's Medications  New Prescriptions   No medications on file  Previous Medications   ALLOPURINOL (ZYLOPRIM) 100 MG TABLET    Take 200 mg by mouth daily after supper.    ASPIRIN 325 MG TABLET    Take 325 mg by mouth daily.   CARVEDILOL (COREG) 3.125 MG TABLET    Take 1 tablet (3.125 mg total) by mouth 2 (two) times daily with a meal.   CLOPIDOGREL (PLAVIX) 75 MG TABLET    Take 1 tablet (75 mg total) by mouth daily with breakfast.   FERROUS SULFATE 325 (65 FE) MG TABLET    Take 325 mg by mouth 3 (three) times daily with meals.   GABAPENTIN (NEURONTIN) 600 MG TABLET    Take 600-1,200  mg by mouth See admin instructions. Take 1 tablet (600 mg) by mouth twice daily - morning and afternoon and take 2 tablets (1200 mg) at bedtime   INSULIN ASPART (NOVOLOG) 100 UNIT/ML INJECTION    Inject 18 Units into the skin 3 (three) times daily before meals.    INSULIN GLARGINE (LANTUS) 100 UNIT/ML INJECTION    Inject 0.5 mLs (50 Units total) into the skin at bedtime.   LISINOPRIL (PRINIVIL,ZESTRIL) 5 MG TABLET    Take 5 mg by mouth daily.    MAGNESIUM HYDROXIDE (MILK OF MAGNESIA) 400 MG/5ML SUSPENSION    Take 30 mLs by mouth daily as needed for mild constipation.   METFORMIN (GLUCOPHAGE) 500 MG TABLET    Take 500 mg by mouth daily with breakfast.   METHOCARBAMOL (ROBAXIN) 500 MG TABLET    Take 500 mg by mouth every 6 (six) hours as needed for muscle spasms.    MORPHINE (MS CONTIN) 15 MG 12 HR TABLET    Take 1 tablet (15 mg total) by mouth every 12 (twelve) hours.   OXYCODONE HCL 10 MG TABS    Take 1-1.5 tablets (10-15 mg total) by mouth every 4 (four) hours as needed.   POLYETHYLENE GLYCOL (MIRALAX / GLYCOLAX) PACKET    Take 17 g by mouth daily.   SENNA-DOCUSATE (SENOKOT-S) 8.6-50 MG TABLET    Take 1 tablet by mouth 2 (two) times daily.   SIMVASTATIN (ZOCOR) 20 MG  TABLET    Take 20 mg by mouth daily.    TAMSULOSIN (FLOMAX) 0.4 MG CAPS CAPSULE    Take 1 capsule (0.4 mg total) by mouth daily.   TORSEMIDE (DEMADEX) 20 MG TABLET    Take 20 mg by mouth daily.   Modified Medications   No medications on file  Discontinued Medications   No medications on file     SIGNIFICANT DIAGNOSTIC EXAMS  11-26-15: ct of chest: 1. Clusters of nodules and "tree-in-bud" opacities in the right greater than left lung apices compatible with bronchiolitis. 2. Aortic valvular calcification associated with aortic stenosis. 3. Aortic atherosclerosis. 4. Moderate coronary artery calcification.  02-15-16: TEE: - Left ventricle: The cavity size was normal. There was mild hypertrophy of the septum with moderate hypertrophy of the posterior wall. Systolic function was normal. The estimated ejection fraction was in the range of 50% to 55%. Wall motion was normal; there were no regional wall motion abnormalities. Features are consistent with a pseudonormal left ventricular filling pattern, with concomitant abnormal relaxation and increased filling pressure (grade 2 diastolic dysfunction). Doppler parameters are consistent with high ventricular fillingpressure. - Aortic valve: Transvalvular velocity was within the normal range. There was no stenosis. There was no regurgitation. - Mitral valve: Transvalvular velocity was within the normal range. There was no evidence for stenosis. There was no regurgitation. - Right ventricle: The cavity size was normal. Wall thickness was normal. Systolic function was normal. - Tricuspid valve: There was trivial regurgitation. - Pulmonary arteries: Systolic pressure was within the normal   range. PA peak pressure: 34 mm Hg (S).  05-31-16: chest x-ray: No active cardiopulmonary disease.       LABS REVIEWED:   08-31-15: wbc 10.7; hgb 8.7; hct 27.5; mcv 86.8; plt 453; glucose 163; bun 32; creat 1.42; k+4.1; na++132; liver normal albumin 2.6 08-26-15: wbc  9.1; hgb 8.9; hct 28.5; mcv 86.6; plt 572; glucose 140; bun 26; creat 1.18; k+ 4.4; na++132 09-01-15: wbc 8.0; hgb 8.7; hct 27.6;mcv 87.9; plt 456; glucose 128; bun 33; creat  1.26; k+ 5.5; na++ 134 09-04-15: glucose 117; bun 24; creat 1.02; k+ 4.7; na++ 134  09-18-15: glucose 166; bun 10.4; creat 0.93; k+ 4.1; na++ 140  11-25-15: wbc 8.9; hgb 6.3; hct 70.6; mcv 76.3; plt 462; glucose 397; bun 14; creat 1.06; k+ 4.1; na++ 129 11-26-15; hgb a1c 7.6; vit B 12: 287; iron 67; tibc 451; ferritin 8; ldh 149  12-27-15: glucose 520; bun 16.1; creat 1.00; k+ 4.6; na++ 134 01-12-16: urine micro-albumin <1.2 01-17-16: uric acid: 8.3 05-31-16: hgb a1c 9.0 06-10-16: wbc 9.3; hgb 8.1; hct 26.8; mcv 81.7; plt 500; glucose 254; bun 6; creat 0.75; k+ 4.4; na++ 134; liver normal albumin 3.0; blood cultures: no growth 06-12-16: wbc 9.5; hgb 7.0; hct 23.9; mcv 82.3; plt 466 06-13-16: wbc 9.7; hgb 7.1; hct 23.9; mcv 83.6; plt 506; glucose 214; bun 16; creat 1.24; k+ 4.1; n++ 130 06-16-16: wbc 15.8; hgb 7.1; hgb 23.4; mcv 89.3; plt 494; glucose 61; bun 11; creat 1.08'; k+ 3.6; na++ 132  06-17-16: wbc 11.6; hgb 6.9; hct 22.7; mcv 84.1; plt 528; glucose 84; bun 14; creat 1.26; k+ 4.0; na++ 134    Review of Systems  Constitutional: Negative for malaise/fatigue.  Respiratory: Negative for cough and shortness of breath.   Cardiovascular: Negative for chest pain, palpitations has right lower extremity edema  Gastrointestinal: Negative for abdominal pain, constipation and heartburn.  Musculoskeletal: has right lower extremity pain .  Skin: Negative.   Neurological: Negative for dizziness.  Psychiatric/Behavioral: The patient is not nervous/anxious.     Physical Exam  Constitutional: No distress.  Eyes: Conjunctivae are normal.  Neck: Neck supple. No JVD present. No thyromegaly present.  Cardiovascular: Normal rate, regular rhythm and intact distal pulses.   Respiratory: Effort normal and breath sounds normal. No respiratory  distress. He has no wheezes.  GI: Soft. Bowel sounds are normal. He exhibits no distension. There is no tenderness.  Musculoskeletal: 2+ right lower extremity edema   Able to move all extremities Is status post right transmetatarsal amputation  Status post left bka  Lymphadenopathy:    He has no cervical adenopathy.  Neurological: He is alert.  Skin: Skin is warm and dry. He is not diaphoretic.  Psychiatric: He has a normal mood and affect.  Right transmetatarsal amputation stump: small amount of serous drainage present. There is incisional dehiscence with muscle exposure present    ASSESSMENT/ PLAN:  1. Right foot stump: will continue with current wound treatments; I have strongly encouraged him to elevate his lower extremity at all times. The nursing supervisor has placed a call to his surgeon and will continue to monitor his status.    MD is aware of resident's narcotic use and is in agreement with current plan of care. We will attempt to wean resident as apropriate     Ok Edwards NP [facility]  Contact [phone] Monday through Friday 8am- 5pm  After hours call [phone]

## 2016-06-27 ENCOUNTER — Emergency Department (HOSPITAL_COMMUNITY): Payer: Medicaid Other

## 2016-06-27 ENCOUNTER — Encounter: Payer: Self-pay | Admitting: Internal Medicine

## 2016-06-27 ENCOUNTER — Emergency Department (HOSPITAL_COMMUNITY)
Admission: EM | Admit: 2016-06-27 | Discharge: 2016-06-27 | Disposition: A | Discharge status: Home / Self Care | Payer: Medicaid Other | Attending: Emergency Medicine | Admitting: Emergency Medicine

## 2016-06-27 ENCOUNTER — Encounter (HOSPITAL_COMMUNITY): Payer: Self-pay

## 2016-06-27 DIAGNOSIS — I639 Cerebral infarction, unspecified: Secondary | ICD-10-CM | POA: Diagnosis not present

## 2016-06-27 DIAGNOSIS — I129 Hypertensive chronic kidney disease with stage 1 through stage 4 chronic kidney disease, or unspecified chronic kidney disease: Secondary | ICD-10-CM | POA: Diagnosis not present

## 2016-06-27 DIAGNOSIS — Z87891 Personal history of nicotine dependence: Secondary | ICD-10-CM | POA: Insufficient documentation

## 2016-06-27 DIAGNOSIS — E1122 Type 2 diabetes mellitus with diabetic chronic kidney disease: Secondary | ICD-10-CM | POA: Insufficient documentation

## 2016-06-27 DIAGNOSIS — Z4800 Encounter for change or removal of nonsurgical wound dressing: Secondary | ICD-10-CM | POA: Insufficient documentation

## 2016-06-27 DIAGNOSIS — D649 Anemia, unspecified: Secondary | ICD-10-CM | POA: Diagnosis not present

## 2016-06-27 DIAGNOSIS — Z7982 Long term (current) use of aspirin: Secondary | ICD-10-CM | POA: Diagnosis not present

## 2016-06-27 DIAGNOSIS — N183 Chronic kidney disease, stage 3 (moderate): Secondary | ICD-10-CM | POA: Insufficient documentation

## 2016-06-27 DIAGNOSIS — Z5189 Encounter for other specified aftercare: Secondary | ICD-10-CM

## 2016-06-27 DIAGNOSIS — Z79899 Other long term (current) drug therapy: Secondary | ICD-10-CM | POA: Insufficient documentation

## 2016-06-27 DIAGNOSIS — Z794 Long term (current) use of insulin: Secondary | ICD-10-CM | POA: Diagnosis not present

## 2016-06-27 DIAGNOSIS — M79671 Pain in right foot: Secondary | ICD-10-CM | POA: Diagnosis not present

## 2016-06-27 LAB — CBC WITH DIFFERENTIAL/PLATELET
BASOS ABS: 0 10*3/uL (ref 0.0–0.1)
BASOS PCT: 0 %
EOS ABS: 0.3 10*3/uL (ref 0.0–0.7)
EOS PCT: 3 %
HCT: 22.7 % — ABNORMAL LOW (ref 39.0–52.0)
HEMOGLOBIN: 7 g/dL — AB (ref 13.0–17.0)
LYMPHS ABS: 2.4 10*3/uL (ref 0.7–4.0)
Lymphocytes Relative: 25 %
MCH: 26.1 pg (ref 26.0–34.0)
MCHC: 30.8 g/dL (ref 30.0–36.0)
MCV: 84.7 fL (ref 78.0–100.0)
Monocytes Absolute: 0.5 10*3/uL (ref 0.1–1.0)
Monocytes Relative: 5 %
NEUTROS PCT: 67 %
Neutro Abs: 6.4 10*3/uL (ref 1.7–7.7)
PLATELETS: 407 10*3/uL — AB (ref 150–400)
RBC: 2.68 MIL/uL — AB (ref 4.22–5.81)
RDW: 16.7 % — ABNORMAL HIGH (ref 11.5–15.5)
WBC: 9.6 10*3/uL (ref 4.0–10.5)

## 2016-06-27 LAB — BASIC METABOLIC PANEL
ANION GAP: 7 (ref 5–15)
BUN: 11 mg/dL (ref 6–20)
CHLORIDE: 99 mmol/L — AB (ref 101–111)
CO2: 28 mmol/L (ref 22–32)
Calcium: 8.9 mg/dL (ref 8.9–10.3)
Creatinine, Ser: 1 mg/dL (ref 0.61–1.24)
Glucose, Bld: 218 mg/dL — ABNORMAL HIGH (ref 65–99)
POTASSIUM: 4.2 mmol/L (ref 3.5–5.1)
SODIUM: 134 mmol/L — AB (ref 135–145)

## 2016-06-27 MED ORDER — CEPHALEXIN 250 MG PO CAPS
500.0000 mg | ORAL_CAPSULE | Freq: Once | ORAL | Status: AC
  Administered 2016-06-27: 500 mg via ORAL
  Filled 2016-06-27: qty 2

## 2016-06-27 MED ORDER — OXYCODONE HCL 5 MG PO TABS
15.0000 mg | ORAL_TABLET | Freq: Once | ORAL | Status: AC
Start: 2016-06-27 — End: 2016-06-27
  Administered 2016-06-27: 15 mg via ORAL
  Filled 2016-06-27: qty 3

## 2016-06-27 MED ORDER — CEPHALEXIN 500 MG PO CAPS: 500.0000 mg | ORAL_CAPSULE | Freq: Four times a day (QID) | ORAL | 0 refills | Status: DC

## 2016-06-27 NOTE — ED Provider Notes (Signed)
Medical screening examination/treatment/procedure(s) were conducted as a shared visit with non-physician practitioner(s) and myself.  I personally evaluated the patient during the encounter.   EKG Interpretation None       Patient seen and evaluated. Some minimal separation of wound but not frank dehiscence. Overall appears well. Not erythematous. Not painful. Draining clear fluid. No bleeding. Patient's hemoglobin is 7.0 which is near his baseline. His patient not feeling orthostatic testing here would be appropriate for discharge. Has appointment with his physician tomorrow morning.   Tanna Furry, MD 06/27/16 1515

## 2016-06-27 NOTE — ED Provider Notes (Signed)
[facility] DEPT Provider Note   CSN: [id number] Arrival date & time: 06/27/16  1229     History   Chief Complaint Chief Complaint  Patient presents with  . Wound Check  . Foot Pain    HPI Cory Weber is a 66 y.o. male.  66 y.o. male with a PMH of asthma, BPH, CAD, history of CVA, hepatitis C, hyperlipidemia, hypertension, PVD with left BKA, diabetes, and recent hospitalization 05/31/16-06/04/16 for treatment of ischemia of right foot with gangrene, status post arthrectomy and balloon angioplasty of right SFA with stent, discharged on aspirin and Plavix, who was admitted 06/10/16 for severe right foot pain unrelieved by oral opiates.Patient was subsequently evaluated by vascular surgery and ultimately underwent a right transmetatarsal amputation on 06/13/16. He was doing well until today when he noticed some discharge from the surgical incision site. He presented for evaluation of potential infection.   The history is provided by the patient. No language interpreter was used.  Wound Check   Foot Pain     Past Medical History:  Diagnosis Date  . Anemia   . Asthma   . BPH (benign prostatic hyperplasia)   . Cellulitis and abscess of foot 07/2015  . Chronic kidney disease    CKD stage 3  . CVA (cerebral vascular accident) ([place]) 2015   denies residual on 08/15/2015  . Hepatitis C    states he was diagnosed in 2007 or 2007 while living in [place], [place]  . Hyperlipidemia   . Hypertension   . Ischemia of foot 05/31/2016  . Peripheral neuropathy   . Pneumonia X 1  . PVD (peripheral vascular disease) ([place])   . Type II diabetes mellitus ([place])   . Wet gangrene ([place]) 06/10/2016    Patient Active Problem List   Diagnosis Date Noted  . S/P transmetatarsal amputation of foot, right ([place]) 06/26/2016  . Constipation due to opioid therapy 06/15/2016  . Physical deconditioning 06/10/2016  . Wet gangrene ([place]) 06/10/2016  . Anemia, chronic disease 06/10/2016  . Chronic diastolic  CHF (congestive heart failure) ([place]) 06/10/2016  . Ischemic pain of right foot 06/04/2016  . Ischemia of foot 05/31/2016  . 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 Dyslipidemia associated with type 2 diabetes mellitus ([place]) 01/21/2016  . Uncontrolled type II diabetes with peripheral autonomic neuropathy ([place]) 01/17/2016  . Weight gain 12/20/2015  . Edema of right lower extremity 12/20/2015  . Cough 11/26/2015  . GERD (gastroesophageal reflux disease) 10/08/2015  . Amputation of left lower extremity below knee ([place]) 08/21/2015  . Phantom limb pain ([place])   . Anemia of chronic disease   . BPH (benign prostatic hyperplasia)   . ETOH abuse   . PVD (peripheral vascular disease) ([place])   . Chronic hepatitis C without hepatic coma ([place])   . History of CVA (cerebrovascular accident) without residual deficits   . Hyponatremia   . Atherosclerosis of left lower extremity with ulceration of midfoot ([place])   . Chronic kidney disease, stage 3 07/18/2015  . Essential hypertension 07/18/2015  . S/P lumbar spinal fusion 11/25/2013  . Melena 12/03/2011  . Hemiparesthesia 02/03/2011  . Polysubstance abuse 02/03/2011  . Tobacco abuse 02/03/2011    Past Surgical History:  Procedure Laterality Date  . ABDOMINAL AORTOGRAM N/A 06/03/2016   Procedure: Abdominal Aortogram;  Surgeon: Waynetta Sandy, MD;  Location: [facility];  Service: Cardiovascular;  Laterality: N/A;  . AMPUTATION Left 08/16/2015  Procedure: LEFT BELOW THE KNEE AMPUTATION ;  Surgeon: Serafina Mitchell, MD;  Location: [facility];  Service: Vascular;  Laterality: Left;  . BACK SURGERY    . BELOW KNEE LEG AMPUTATION Left 08/16/2015  . BIOPSY N/A 09/03/2012   Procedure: BIOPSY;  Surgeon: Daneil Dolin, MD;  Location: [facility];  Service: Endoscopy;  Laterality: N/A;  gastric and gastric mucosa  . BIOPSY N/A 12/03/2012   Procedure: BIOPSY;  Surgeon: Daneil Dolin, MD;  Location: [facility];  Service: Endoscopy;  Laterality: N/A;  . COLONOSCOPY WITH PROPOFOL N/A 09/03/2012   ZJQ:BHALPFX polyp-removed as outlined above. Prominent internal hemorrhoids. Tubular adenoma  . ESOPHAGOGASTRODUODENOSCOPY (EGD) WITH PROPOFOL N/A 09/03/2012   TKW:IOXBDZ hernia. Gastric diverticulum. Gastric ulcers with associated erosions. Duodenal erosions. Status post gastric biopsy. H.PYLORI gastritis   . ESOPHAGOGASTRODUODENOSCOPY (EGD) WITH PROPOFOL N/A 12/03/2012   Dr. Gala Romney: gastric diverticulum, gastric erosions and scar. Previously noted gastric ulcer completed healed. Biopsy without H.pylori.   . ESOPHAGOGASTRODUODENOSCOPY (EGD) WITH PROPOFOL N/A 01/23/2016   Procedure: ESOPHAGOGASTRODUODENOSCOPY (EGD) WITH PROPOFOL;  Surgeon: Doran Stabler, MD;  Location: [facility];  Service: Gastroenterology;  Laterality: N/A;  . LIVER BIOPSY  2005   Done in [place], [place]. Chronic hepatitis with mild periportal inflammation, lobular unicellular necrosis and portal fibrosis. Grade 2, stage 1-2.  . LOWER EXTREMITY ANGIOGRAPHY Right 06/03/2016   Procedure: Lower Extremity Angiography;  Surgeon: Waynetta Sandy, MD;  Location: [facility];  Service: Cardiovascular;  Laterality: Right;  . MAXIMUM ACCESS (MAS)POSTERIOR LUMBAR INTERBODY FUSION (PLIF) 1 LEVEL N/A 11/25/2013   Procedure: FOR MAXIMUM ACCESS (MAS) POSTERIOR LUMBAR INTERBODY FUSION (PLIF) 1 LEVEL;  Surgeon: Eustace Moore, MD;  Location: [facility];  Service: Neurosurgery;  Laterality: N/A;  FOR MAXIMUM ACCESS (MAS) POSTERIOR LUMBAR INTERBODY FUSION (PLIF) 1 LEVEL LUMBAR 3-4  . PERIPHERAL VASCULAR ATHERECTOMY Right 06/03/2016   Procedure: Peripheral Vascular Atherectomy;  Surgeon: Waynetta Sandy, MD;  Location: [facility];  Service: Cardiovascular;  Laterality: Right;  SFA WITH STENT  . PERIPHERAL VASCULAR CATHETERIZATION Left 08/01/2015   Procedure: Lower Extremity Angiography;  Surgeon: Serafina Mitchell, MD;  Location:  [facility];  Service: Cardiovascular;  Laterality: Left;  . PERIPHERAL VASCULAR CATHETERIZATION N/A 08/01/2015   Procedure: Abdominal Aortogram;  Surgeon: Serafina Mitchell, MD;  Location: [facility];  Service: Cardiovascular;  Laterality: N/A;  . PERIPHERAL VASCULAR CATHETERIZATION N/A 08/08/2015   Procedure: Abdominal Aortogram w/Lower Extremity;  Surgeon: Serafina Mitchell, MD;  Location: [facility];  Service: Cardiovascular;  Laterality: N/A;  . PERIPHERAL VASCULAR CATHETERIZATION Left 08/08/2015   Procedure: Peripheral Vascular Balloon Angioplasty;  Surgeon: Serafina Mitchell, MD;  Location: [facility];  Service: Cardiovascular;  Laterality: Left;  left popiteal artery, left peronealtrunk, left post tibial  . POLYPECTOMY N/A 09/03/2012   Procedure: POLYPECTOMY;  Surgeon: Daneil Dolin, MD;  Location: [facility];  Service: Endoscopy;  Laterality: N/A;  cecal polyp  . TONSILLECTOMY    . TRANSMETATARSAL AMPUTATION Right 06/13/2016   Procedure: RIGHT TRANSMETATARSAL AMPUTATION;  Surgeon: Waynetta Sandy, MD;  Location: [facility];  Service: Vascular;  Laterality: Right;       Home Medications    Prior to Admission medications   Medication Sig Start Date End Date Taking? Authorizing Provider  allopurinol (ZYLOPRIM) 100 MG tablet Take 200 mg by mouth daily after supper.     [provider]  aspirin 325 MG tablet  Take 325 mg by mouth daily.    [provider]  carvedilol (COREG) 3.125 MG tablet Take 1 tablet (3.125 mg total) by mouth 2 (two) times daily with a meal. 08/10/15   Cristal Ford, DO  clopidogrel (PLAVIX) 75 MG tablet Take 1 tablet (75 mg total) by mouth daily with breakfast. 06/05/16   Dhungel, Nishant, MD  ferrous sulfate 325 (65 FE) MG tablet Take 325 mg by mouth 3 (three) times daily with meals.    [provider]  gabapentin (NEURONTIN) 600 MG tablet Take 600-1,200 mg by mouth See admin instructions. Take 1 tablet (600 mg) by  mouth twice daily - morning and afternoon and take 2 tablets (1200 mg) at bedtime    [provider]  insulin aspart (NOVOLOG) 100 UNIT/ML injection Inject 18 Units into the skin 3 (three) times daily before meals.     [provider]  insulin glargine (LANTUS) 100 UNIT/ML injection Inject 0.5 mLs (50 Units total) into the skin at bedtime. 06/17/16   Rama, Venetia Maxon, MD  lisinopril (PRINIVIL,ZESTRIL) 5 MG tablet Take 5 mg by mouth daily.     [provider]  magnesium hydroxide (MILK OF MAGNESIA) 400 MG/5ML suspension Take 30 mLs by mouth daily as needed for mild constipation.    [provider]  metFORMIN (GLUCOPHAGE) 500 MG tablet Take 500 mg by mouth daily with breakfast.    [provider]  methocarbamol (ROBAXIN) 500 MG tablet Take 500 mg by mouth every 6 (six) hours as needed for muscle spasms.     [provider]  morphine (MS CONTIN) 15 MG 12 hr tablet Take 1 tablet (15 mg total) by mouth every 12 (twelve) hours. 06/19/16   Estill Dooms, MD  Oxycodone HCl 10 MG TABS Take 1-1.5 tablets (10-15 mg total) by mouth every 4 (four) hours as needed. 06/19/16   Estill Dooms, MD  polyethylene glycol [facility] / Floria Raveling) packet Take 17 g by mouth daily. 06/05/16   Dhungel, Nishant, MD  senna-docusate (SENOKOT-S) 8.6-50 MG tablet Take 1 tablet by mouth 2 (two) times daily. 06/17/16   Rama, Venetia Maxon, MD  simvastatin (ZOCOR) 20 MG tablet Take 20 mg by mouth daily.     [provider]  tamsulosin (FLOMAX) 0.4 MG CAPS capsule Take 1 capsule (0.4 mg total) by mouth daily. 08/10/15   Mikhail, Velta Addison, DO  torsemide (DEMADEX) 20 MG tablet Take 20 mg by mouth daily.     [provider]    Family History Family History  Problem Relation Age of Onset  . Breast cancer Mother   . Heart failure Mother   . Diabetes Father   . Hypertension Father   . Heart disease Father   . Hyperlipidemia Father   . Diabetes Sister   . Hypertension  Sister   . Breast cancer Sister   . Colon cancer Neg Hx   . Liver disease Neg Hx     Social History Social History  Substance Use Topics  . Smoking status: Former Smoker    Packs/day: 1.00    Years: 47.00    Types: Cigarettes    Quit date: 07/13/2015  . Smokeless tobacco: Never Used  . Alcohol use No     Comment:  none 08/15/2015 "stopped drinking 3-4 years ago"     Allergies   Patient has no known allergies.   Review of Systems Review of Systems  Constitutional: Negative for chills and fever.  Musculoskeletal:  See HPI  Skin: Positive for wound.  Neurological: Negative.      Physical Exam Updated Vital Signs BP 131/62 (BP Location: Left Arm)   Pulse 89   Temp 98.4 F (36.9 C) (Oral)   Resp 17   Ht 6\' 1"  (1.854 m)   Wt 86.2 kg   SpO2 99%   BMI 25.07 kg/m   Physical Exam  Constitutional: He is oriented to person, place, and time. He appears well-developed and well-nourished.  Neck: Normal range of motion.  Pulmonary/Chest: Effort normal.  Musculoskeletal: Normal range of motion.  Right foot s/p transmetatarsal amputation with sutures intact. There is no malodor. There is minimal non-purulent drainage, no bleeding.   Neurological: He is alert and oriented to person, place, and time.  Skin: Skin is warm and dry.  Psychiatric: He has a normal mood and affect.     ED Treatments / Results  Labs (all labs ordered are listed, but only abnormal results are displayed) Labs Reviewed  CBC WITH DIFFERENTIAL/PLATELET  BASIC METABOLIC PANEL   Results for orders placed or performed during the hospital encounter of 06/27/16  CBC with Differential  Result Value Ref Range   WBC 9.6 4.0 - 10.5 K/uL   RBC 2.68 (L) 4.22 - 5.81 MIL/uL   Hemoglobin 7.0 (L) 13.0 - 17.0 g/dL   HCT 22.7 (L) 39.0 - 52.0 %   MCV 84.7 78.0 - 100.0 fL   MCH 26.1 26.0 - 34.0 pg   MCHC 30.8 30.0 - 36.0 g/dL   RDW 16.7 (H) 11.5 - 15.5 %   Platelets 407 (H) 150 - 400 K/uL   Neutrophils  Relative % 67 %   Neutro Abs 6.4 1.7 - 7.7 K/uL   Lymphocytes Relative 25 %   Lymphs Abs 2.4 0.7 - 4.0 K/uL   Monocytes Relative 5 %   Monocytes Absolute 0.5 0.1 - 1.0 K/uL   Eosinophils Relative 3 %   Eosinophils Absolute 0.3 0.0 - 0.7 K/uL   Basophils Relative 0 %   Basophils Absolute 0.0 0.0 - 0.1 K/uL  Basic metabolic panel  Result Value Ref Range   Sodium 134 (L) 135 - 145 mmol/L   Potassium 4.2 3.5 - 5.1 mmol/L   Chloride 99 (L) 101 - 111 mmol/L   CO2 28 22 - 32 mmol/L   Glucose, Bld 218 (H) 65 - 99 mg/dL   BUN 11 6 - 20 mg/dL   Creatinine, Ser 1.00 0.61 - 1.24 mg/dL   Calcium 8.9 8.9 - 10.3 mg/dL   GFR calc non Af Amer >60 >60 mL/min   GFR calc Af Amer >60 >60 mL/min   Anion gap 7 5 - 15     EKG  EKG Interpretation None       Radiology Dg Foot Complete Right  Result Date: 06/27/2016 CLINICAL DATA:  Valve postop appearance following amputation EXAM: RIGHT FOOT COMPLETE - 3+ VIEW COMPARISON:  06/10/16 FINDINGS: Transmetatarsal amputation is noted. A few small bony fragments are noted in the surgical bed. No erosive changes are seen. No other focal abnormality is noted. IMPRESSION: Postsurgical changes as described. Electronically Signed   By: Inez Catalina M.D.   On: 06/27/2016 13:34    Procedures Procedures (including critical care time)  Medications Ordered in ED Medications  oxyCODONE (Oxy IR/ROXICODONE) immediate release tablet 15 mg (not administered)     Initial Impression / Assessment and Plan / ED Course  I have reviewed the triage vital signs and the nursing notes.  Pertinent labs & imaging results that were available during my care of the patient were reviewed by me and considered in my medical decision making (see chart for details).     Patient with recent transmetatarsal amputation concerned for infection. No evidence of infection at surgical incision. Sutures intact. Labs are essentially unremarkable with the exception of anemia of 7.0. He has a  history of anemia of chronic disease, recently transfused with 2 units. He denies lightheadedness, synocpe or near syncope. He is not tachycardic or hypotensive. Do not feel urgent transfusion is necessary. The patient is informed and will have re-evaluation per scheduled appointment with vascular tomorrow.   He can be discharged home to keep follow up appointment.   Final Clinical Impressions(s) / ED Diagnoses   Final diagnoses:  None   1. Wound recheck 2. Anemia 3. History of anemia of chronic disease  New Prescriptions New Prescriptions   No medications on file     Dennie Bible 06/27/16 1522    Tanna Furry, MD 06/28/16 [phone]

## 2016-06-27 NOTE — Discharge Instructions (Signed)
Keep your scheduled appointment with Dr. Trula Slade tomorrow for surgical wound re-check and for re-evaluation of your hemoglobin level. REturn to the ED with any new concerns.

## 2016-06-27 NOTE — Progress Notes (Signed)
Patient ID: Cory Weber, male   DOB: [birth year], 66 y.o.   MRN: [id number]    HISTORY AND PHYSICAL   DATE: 06/27/2016  Location:    [facility] Room Number: 127 A Place of Service: SNF (31)   Extended Emergency Contact Information Primary Emergency Contact: Tye Savoy, [place] [address] [place] [employer] Phone: [phone] Relation: Sister Secondary Emergency Contact: Kerrin Mo,  [address] [place] [employer] Phone: [phone] Relation: Sister  Advanced Directive information Does Patient Have a Medical Advance Directive?: Yes, Type of Advance Directive: Out of facility DNR (pink MOST or yellow form), Pre-existing out of facility DNR order (yellow form or pink MOST form): Pink MOST form placed in chart (order not valid for inpatient use), Does patient want to make changes to medical advance directive?: No - Patient declined  Chief Complaint  Patient presents with  . Readmit To SNF    Readmission    HPI:    Past Medical History:  Diagnosis Date  . Anemia   . Asthma   . BPH (benign prostatic hyperplasia)   . Cellulitis and abscess of foot 07/2015  . Chronic kidney disease    CKD stage 3  . CVA (cerebral vascular accident) ([place]) 2015   denies residual on 08/15/2015  . Hepatitis C    states he was diagnosed in 2007 or 2007 while living in [place], [place]  . Hyperlipidemia   . Hypertension   . Ischemia of foot 05/31/2016  . Peripheral neuropathy   . Pneumonia X 1  . PVD (peripheral vascular disease) ([place])   . Type II diabetes mellitus ([place])   . 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 ABDOMINAL AORTOGRAM N/A 06/03/2016   Procedure: Abdominal Aortogram;  Surgeon: Waynetta Sandy, MD;  Location: [facility];  Service: Cardiovascular;  Laterality: N/A;  . AMPUTATION Left 08/16/2015   Procedure: LEFT BELOW THE KNEE AMPUTATION ;  Surgeon:  Serafina Mitchell, MD;  Location: [facility];  Service: Vascular;  Laterality: Left;  . BACK SURGERY    . BELOW KNEE LEG AMPUTATION Left 08/16/2015  . BIOPSY N/A 09/03/2012   Procedure: BIOPSY;  Surgeon: Daneil Dolin, MD;  Location: [facility];  Service: Endoscopy;  Laterality: N/A;  gastric and gastric mucosa  . BIOPSY N/A 12/03/2012   Procedure: BIOPSY;  Surgeon: Daneil Dolin, MD;  Location: [facility];  Service: Endoscopy;  Laterality: N/A;  . COLONOSCOPY WITH PROPOFOL N/A 09/03/2012   TDV:VOHYWVP polyp-removed as outlined above. Prominent internal hemorrhoids. Tubular adenoma  . ESOPHAGOGASTRODUODENOSCOPY (EGD) WITH PROPOFOL N/A 09/03/2012   XTG:GYIRSW hernia. Gastric diverticulum. Gastric ulcers with associated erosions. Duodenal erosions. Status post gastric biopsy. H.PYLORI gastritis   . ESOPHAGOGASTRODUODENOSCOPY (EGD) WITH PROPOFOL N/A 12/03/2012   Dr. Gala Romney: gastric diverticulum, gastric erosions and scar. Previously noted gastric ulcer completed healed. Biopsy without H.pylori.   . ESOPHAGOGASTRODUODENOSCOPY (EGD) WITH PROPOFOL N/A 01/23/2016   Procedure: ESOPHAGOGASTRODUODENOSCOPY (EGD) WITH PROPOFOL;  Surgeon: Doran Stabler, MD;  Location: [facility];  Service: Gastroenterology;  Laterality: N/A;  . LIVER BIOPSY  2005   Done in [place], [place]. Chronic hepatitis with mild periportal inflammation, lobular unicellular necrosis and portal fibrosis. Grade 2, stage 1-2.  . LOWER EXTREMITY ANGIOGRAPHY Right 06/03/2016   Procedure: Lower Extremity Angiography;  Surgeon: Waynetta Sandy, MD;  Location:  [facility];  Service: Cardiovascular;  Laterality: Right;  . MAXIMUM ACCESS (MAS)POSTERIOR LUMBAR INTERBODY FUSION (PLIF) 1 LEVEL N/A 11/25/2013   Procedure: FOR MAXIMUM ACCESS (MAS) POSTERIOR LUMBAR INTERBODY FUSION (PLIF) 1 LEVEL;  Surgeon: Eustace Moore, MD;  Location: [facility];  Service: Neurosurgery;  Laterality: N/A;  FOR MAXIMUM ACCESS (MAS) POSTERIOR LUMBAR INTERBODY FUSION  (PLIF) 1 LEVEL LUMBAR 3-4  . PERIPHERAL VASCULAR ATHERECTOMY Right 06/03/2016   Procedure: Peripheral Vascular Atherectomy;  Surgeon: Waynetta Sandy, MD;  Location: [facility];  Service: Cardiovascular;  Laterality: Right;  SFA WITH STENT  . PERIPHERAL VASCULAR CATHETERIZATION Left 08/01/2015   Procedure: Lower Extremity Angiography;  Surgeon: Serafina Mitchell, MD;  Location: [facility];  Service: Cardiovascular;  Laterality: Left;  . PERIPHERAL VASCULAR CATHETERIZATION N/A 08/01/2015   Procedure: Abdominal Aortogram;  Surgeon: Serafina Mitchell, MD;  Location: [facility];  Service: Cardiovascular;  Laterality: N/A;  . PERIPHERAL VASCULAR CATHETERIZATION N/A 08/08/2015   Procedure: Abdominal Aortogram w/Lower Extremity;  Surgeon: Serafina Mitchell, MD;  Location: [facility];  Service: Cardiovascular;  Laterality: N/A;  . PERIPHERAL VASCULAR CATHETERIZATION Left 08/08/2015   Procedure: Peripheral Vascular Balloon Angioplasty;  Surgeon: Serafina Mitchell, MD;  Location: [facility];  Service: Cardiovascular;  Laterality: Left;  left popiteal artery, left peronealtrunk, left post tibial  . POLYPECTOMY N/A 09/03/2012   Procedure: POLYPECTOMY;  Surgeon: Daneil Dolin, MD;  [facility];  Service: Endoscopy;  Laterality: N/A;  cecal polyp  . TONSILLECTOMY    . TRANSMETATARSAL AMPUTATION Right 06/13/2016   Procedure: RIGHT TRANSMETATARSAL AMPUTATION;  Surgeon: Waynetta Sandy, MD;  Location: [facility];  Service: Vascular;  Laterality: Right;    Patient Care Team: Iona Beard, MD as PCP - General (Family Medicine) Gala Romney, Cristopher Estimable, MD as Consulting Physician (Gastroenterology) Herminio Commons, MD as Attending Physician (Cardiology) Meredith Staggers, MD as Consulting Physician (Physical Medicine and Rehabilitation)  Social History   Social History  . Marital status: Divorced    Spouse name: N/A  . Number of children: 1  . Years of education: N/A     Occupational History  . trying to get disability    Social History Main Topics  . Smoking status: Former Smoker    Packs/day: 1.00    Years: 47.00    Types: Cigarettes    Quit date: 07/13/2015  . Smokeless tobacco: Never Used  . Alcohol use No     Comment:  none 08/15/2015 "stopped drinking 3-4 years ago"  . Drug use: No     Comment: 08/15/2015 "last used cocaine 3-4 months ago"  . Sexual activity: No   Other Topics Concern  . Not on file   Social History Narrative  . No narrative on file     reports that he quit smoking about a year ago. His smoking use included Cigarettes. He has a 47.00 pack-year smoking history. He has never used smokeless tobacco. He reports that he does not drink alcohol or use drugs.  Family History  Problem Relation Age of Onset  . Breast cancer Mother   . Heart failure Mother   . Diabetes Father   . Hypertension Father   . Heart disease Father   . Hyperlipidemia Father   . Diabetes Sister   . Hypertension Sister   . Breast cancer Sister   . Colon cancer Neg Hx   . Liver disease Neg Hx  Family Status  Relation Status  . Mother Deceased  . Father Deceased  . Sister (Not Specified)  . Sister (Not Specified)  . Neg Hx (Not Specified)    Immunization History  Administered Date(s) Administered  . Influenza,inj,Quad PF,36+ Mos 11/24/2013  . Influenza-Unspecified 11/16/2015  . PPD Test 09/21/2015, 10/27/2015  . Pneumococcal Polysaccharide-23 11/24/2013    No Known Allergies  Medications: Patient's Medications  New Prescriptions   No medications on file  Previous Medications   ALLOPURINOL (ZYLOPRIM) 100 MG TABLET    Take 200 mg by mouth daily after supper.    ASPIRIN 325 MG TABLET    Take 325 mg by mouth daily.   CARVEDILOL (COREG) 3.125 MG TABLET    Take 1 tablet (3.125 mg total) by mouth 2 (two) times daily with a meal.   CLOPIDOGREL (PLAVIX) 75 MG TABLET    Take 1 tablet (75 mg total) by mouth daily with breakfast.   FERROUS  SULFATE 325 (65 FE) MG TABLET    Take 325 mg by mouth 3 (three) times daily with meals.   GABAPENTIN (NEURONTIN) 600 MG TABLET    Take 600-1,200 mg by mouth See admin instructions. Take 1 tablet (600 mg) by mouth twice daily - morning and afternoon and take 2 tablets (1200 mg) at bedtime   INSULIN ASPART (NOVOLOG) 100 UNIT/ML INJECTION    Inject 18 Units into the skin 3 (three) times daily before meals.    INSULIN GLARGINE (LANTUS) 100 UNIT/ML INJECTION    Inject 0.5 mLs (50 Units total) into the skin at bedtime.   LISINOPRIL (PRINIVIL,ZESTRIL) 5 MG TABLET    Take 5 mg by mouth daily.    MAGNESIUM HYDROXIDE (MILK OF MAGNESIA) 400 MG/5ML SUSPENSION    Take 30 mLs by mouth daily as needed for mild constipation.   METFORMIN (GLUCOPHAGE) 500 MG TABLET    Take 500 mg by mouth daily with breakfast.   METHOCARBAMOL (ROBAXIN) 500 MG TABLET    Take 500 mg by mouth every 6 (six) hours as needed for muscle spasms.    MORPHINE (MS CONTIN) 15 MG 12 HR TABLET    Take 1 tablet (15 mg total) by mouth every 12 (twelve) hours.   OXYCODONE HCL 10 MG TABS    Take 1-1.5 tablets (10-15 mg total) by mouth every 4 (four) hours as needed.   POLYETHYLENE GLYCOL (MIRALAX / GLYCOLAX) PACKET    Take 17 g by mouth daily.   SENNA-DOCUSATE (SENOKOT-S) 8.6-50 MG TABLET    Take 1 tablet by mouth 2 (two) times daily.   SIMVASTATIN (ZOCOR) 20 MG TABLET    Take 20 mg by mouth daily.    TAMSULOSIN (FLOMAX) 0.4 MG CAPS CAPSULE    Take 1 capsule (0.4 mg total) by mouth daily.   TORSEMIDE (DEMADEX) 20 MG TABLET    Take 20 mg by mouth daily.   Modified Medications   No medications on file  Discontinued Medications   No medications on file    Review of Systems  Vitals:   06/27/16 1110  BP: 126/74  Pulse: 93  Temp: 97.6 F (36.4 C)  TempSrc: Oral  Weight: 190 lb (86.2 kg)  Height: _0  (1.854 m)   Body mass index is 25.07 kg/m.  Physical Exam   Labs reviewed: Nursing Home on 06/21/2016  Component Date Value Ref Range  Status  . Hemoglobin 06/18/2016 8.0* 13.5 - 17.5 g/dL Final  . HCT 06/18/2016 25* 41 - 53 % Final  . Neutrophils  Absolute 06/18/2016 8  /L Final  . Platelets 06/18/2016 491* 150 - 399 K/L Final  . WBC 06/18/2016 10.7  10^3/mL Final  Admission on 06/10/2016, Discharged on 06/17/2016  No results displayed because visit has over 200 results.    Admission on 05/31/2016, Discharged on 06/04/2016  Component Date Value Ref Range Status  . Sodium 05/31/2016 130* 135 - 145 mmol/L Final  . Potassium 05/31/2016 4.8  3.5 - 5.1 mmol/L Final  . Chloride 05/31/2016 96* 101 - 111 mmol/L Final  . CO2 05/31/2016 27  22 - 32 mmol/L Final  . Glucose, Bld 05/31/2016 248* 65 - 99 mg/dL Final  . BUN 05/31/2016 21* 6 - 20 mg/dL Final  . Creatinine, Ser 05/31/2016 1.13  0.61 - 1.24 mg/dL Final  . Calcium 05/31/2016 9.2  8.9 - 10.3 mg/dL Final  . Total Protein 05/31/2016 9.3* 6.5 - 8.1 g/dL Final  . Albumin 05/31/2016 3.5  3.5 - 5.0 g/dL Final  . AST 05/31/2016 12* 15 - 41 U/L Final  . ALT 05/31/2016 16* 17 - 63 U/L Final  . Alkaline Phosphatase 05/31/2016 93  38 - 126 U/L Final  . Total Bilirubin 05/31/2016 0.5  0.3 - 1.2 mg/dL Final  . GFR calc non Af Amer 05/31/2016 >60  >60 mL/min Final  . GFR calc Af Amer 05/31/2016 >60  >60 mL/min Final   Comment: (NOTE) The eGFR has been calculated using the CKD EPI equation. This calculation has not been validated in all clinical situations. eGFR's persistently <60 mL/min signify possible Chronic Kidney Disease.   . Anion gap 05/31/2016 7  5 - 15 Final  . Lipase 05/31/2016 17  11 - 51 U/L Final  . WBC 05/31/2016 16.0* 4.0 - 10.5 K/uL Final  . RBC 05/31/2016 3.93* 4.22 - 5.81 MIL/uL Final  . Hemoglobin 05/31/2016 9.9* 13.0 - 17.0 g/dL Final  . HCT 05/31/2016 31.7* 39.0 - 52.0 % Final  . MCV 05/31/2016 80.7  78.0 - 100.0 fL Final  . MCH 05/31/2016 25.2* 26.0 - 34.0 pg Final  . MCHC 05/31/2016 31.2  30.0 - 36.0 g/dL Final  . RDW 05/31/2016 16.3* 11.5 - 15.5 %  Final  . Platelets 05/31/2016 407* 150 - 400 K/uL Final  . Neutrophils Relative % 05/31/2016 74  % Final  . Neutro Abs 05/31/2016 11.9* 1.7 - 7.7 K/uL Final  . Lymphocytes Relative 05/31/2016 19  % Final  . Lymphs Abs 05/31/2016 3.0  0.7 - 4.0 K/uL Final  . Monocytes Relative 05/31/2016 6  % Final  . Monocytes Absolute 05/31/2016 1.0  0.1 - 1.0 K/uL Final  . Eosinophils Relative 05/31/2016 1  % Final  . Eosinophils Absolute 05/31/2016 0.1  0.0 - 0.7 K/uL Final  . Basophils Relative 05/31/2016 0  % Final  . Basophils Absolute 05/31/2016 0.0  0.0 - 0.1 K/uL Final  . Prothrombin Time 05/31/2016 15.0  11.4 - 15.2 seconds Final  . INR 05/31/2016 1.17   Final  . Lactic Acid, Venous 05/31/2016 1.0  0.5 - 1.9 mmol/L Final  . Glucose-Capillary 05/31/2016 243* 65 - 99 mg/dL Final  . Sodium 06/01/2016 131* 135 - 145 mmol/L Final  . Potassium 06/01/2016 4.4  3.5 - 5.1 mmol/L Final  . Chloride 06/01/2016 98* 101 - 111 mmol/L Final  . CO2 06/01/2016 26  22 - 32 mmol/L Final  . Glucose, Bld 06/01/2016 163* 65 - 99 mg/dL Final  . BUN 06/01/2016 21* 6 - 20 mg/dL Final  . Creatinine, Ser 06/01/2016 1.37*  0.61 - 1.24 mg/dL Final  . Calcium 06/01/2016 9.3  8.9 - 10.3 mg/dL Final  . GFR calc non Af Amer 06/01/2016 53* >60 mL/min Final  . GFR calc Af Amer 06/01/2016 >60  >60 mL/min Final   Comment: (NOTE) The eGFR has been calculated using the CKD EPI equation. This calculation has not been validated in all clinical situations. eGFR's persistently <60 mL/min signify possible Chronic Kidney Disease.   . Anion gap 06/01/2016 7  5 - 15 Final  . WBC 06/01/2016 12.0* 4.0 - 10.5 K/uL Final  . RBC 06/01/2016 3.84* 4.22 - 5.81 MIL/uL Final  . Hemoglobin 06/01/2016 9.8* 13.0 - 17.0 g/dL Final  . HCT 06/01/2016 31.0* 39.0 - 52.0 % Final  . MCV 06/01/2016 80.7  78.0 - 100.0 fL Final  . MCH 06/01/2016 25.5* 26.0 - 34.0 pg Final  . MCHC 06/01/2016 31.6  30.0 - 36.0 g/dL Final  . RDW 06/01/2016 16.9* 11.5 - 15.5  % Final  . Platelets 06/01/2016 437* 150 - 400 K/uL Final  . Color, Urine 06/01/2016 YELLOW  YELLOW Final  . APPearance 06/01/2016 HAZY* CLEAR Final  . Specific Gravity, Urine 06/01/2016 1.016  1.005 - 1.030 Final  . pH 06/01/2016 5.0  5.0 - 8.0 Final  . Glucose, UA 06/01/2016 50* NEGATIVE mg/dL Final  . Hgb urine dipstick 06/01/2016 NEGATIVE  NEGATIVE Final  . Bilirubin Urine 06/01/2016 NEGATIVE  NEGATIVE Final  . Ketones, ur 06/01/2016 NEGATIVE  NEGATIVE mg/dL Final  . Protein, ur 06/01/2016 NEGATIVE  NEGATIVE mg/dL Final  . Nitrite 06/01/2016 NEGATIVE  NEGATIVE Final  . Leukocytes, UA 06/01/2016 NEGATIVE  NEGATIVE Final  . RBC / HPF 06/01/2016 0-5  0 - 5 RBC/hpf Final  . WBC, UA 06/01/2016 0-5  0 - 5 WBC/hpf Final  . Bacteria, UA 06/01/2016 NONE SEEN  NONE SEEN Final  . Squamous Epithelial / LPF 06/01/2016 0-5* NONE SEEN Final  . Mucous 06/01/2016 PRESENT   Final  . Hyaline Casts, UA 06/01/2016 PRESENT   Final  . Specimen Description 06/01/2016 URINE, CLEAN CATCH   Final  . Special Requests 06/01/2016 NONE   Final  . Culture 06/01/2016 <10,000 COLONIES/mL INSIGNIFICANT GROWTH*  Final  . Report Status 06/01/2016 06/02/2016 FINAL   Final  . Specimen Description 05/31/2016 BLOOD BLOOD RIGHT ARM   Final  . Special Requests 05/31/2016 BOTTLES DRAWN AEROBIC AND ANAEROBIC Blood Culture adequate volume   Final  . Culture 05/31/2016 NO GROWTH 5 DAYS   Final  . Report Status 05/31/2016 06/06/2016 FINAL   Final  . Specimen Description 05/31/2016 BLOOD LEFT ANTECUBITAL   Final  . Special Requests 05/31/2016 IN PEDIATRIC BOTTLE Blood Culture adequate volume   Final  . Culture 05/31/2016 NO GROWTH 5 DAYS   Final  . Report Status 05/31/2016 06/06/2016 FINAL   Final  . Hgb A1c MFr Bld 06/01/2016 9.0* 4.8 - 5.6 % Final   Comment: (NOTE)         Pre-diabetes: 5.7 - 6.4         Diabetes: >6.4         Glycemic control for adults with diabetes: <7.0   . Mean Plasma Glucose 06/01/2016 212  mg/dL  Final   Comment: (NOTE) Performed At: [facility] [place], [place] [id number] Lindon Romp MD [id number]   . Glucose-Capillary 05/31/2016 265* 65 - 99 mg/dL Final  . Comment 1 05/31/2016 Notify RN   Final  . Comment 2 05/31/2016 Document in Chart  Final  . MRSA by PCR 06/01/2016 NEGATIVE  NEGATIVE Final   Comment:        The GeneXpert MRSA Assay (FDA approved for NASAL specimens only), is one component of a comprehensive MRSA colonization surveillance program. It is not intended to diagnose MRSA infection nor to guide or monitor treatment for MRSA infections.   . Glucose-Capillary 06/01/2016 180* 65 - 99 mg/dL Final  . Comment 1 06/01/2016 Notify RN   Final  . Comment 2 06/01/2016 Document in Chart   Final  . Right super femoral prox sys PSV 06/01/2016 59  cm/s Final  . Right super femoral mid sys PSV 06/01/2016 -11  cm/s Final  . Right super femoral dist sys PSV 06/01/2016 -13  cm/s Final  . Right popliteal dist sys PSV 06/01/2016 -20  cm/s Final  . Glucose-Capillary 06/01/2016 194* 65 - 99 mg/dL Final  . Comment 1 06/01/2016 Notify RN   Final  . Comment 2 06/01/2016 Document in Chart   Final  . Glucose-Capillary 06/01/2016 156* 65 - 99 mg/dL Final  . Comment 1 06/01/2016 Notify RN   Final  . Comment 2 06/01/2016 Document in Chart   Final  . WBC 06/02/2016 11.0* 4.0 - 10.5 K/uL Final  . RBC 06/02/2016 3.45* 4.22 - 5.81 MIL/uL Final  . Hemoglobin 06/02/2016 8.4* 13.0 - 17.0 g/dL Final  . HCT 06/02/2016 28.3* 39.0 - 52.0 % Final  . MCV 06/02/2016 82.0  78.0 - 100.0 fL Final  . MCH 06/02/2016 24.3* 26.0 - 34.0 pg Final  . MCHC 06/02/2016 29.7* 30.0 - 36.0 g/dL Final  . RDW 06/02/2016 16.6* 11.5 - 15.5 % Final  . Platelets 06/02/2016 413* 150 - 400 K/uL Final  . Sodium 06/02/2016 131* 135 - 145 mmol/L Final  . Potassium 06/02/2016 4.7  3.5 - 5.1 mmol/L Final  . Chloride 06/02/2016 97* 101 - 111 mmol/L Final  . CO2 06/02/2016 26  22 - 32  mmol/L Final  . Glucose, Bld 06/02/2016 151* 65 - 99 mg/dL Final  . BUN 06/02/2016 32* 6 - 20 mg/dL Final  . Creatinine, Ser 06/02/2016 1.55* 0.61 - 1.24 mg/dL Final  . Calcium 06/02/2016 8.8* 8.9 - 10.3 mg/dL Final  . Total Protein 06/02/2016 8.3* 6.5 - 8.1 g/dL Final  . Albumin 06/02/2016 2.9* 3.5 - 5.0 g/dL Final  . AST 06/02/2016 21  15 - 41 U/L Final  . ALT 06/02/2016 20  17 - 63 U/L Final  . Alkaline Phosphatase 06/02/2016 94  38 - 126 U/L Final  . Total Bilirubin 06/02/2016 0.5  0.3 - 1.2 mg/dL Final  . GFR calc non Af Amer 06/02/2016 46* >60 mL/min Final  . GFR calc Af Amer 06/02/2016 53* >60 mL/min Final   Comment: (NOTE) The eGFR has been calculated using the CKD EPI equation. This calculation has not been validated in all clinical situations. eGFR's persistently <60 mL/min signify possible Chronic Kidney Disease.   . Anion gap 06/02/2016 8  5 - 15 Final  . Glucose-Capillary 06/01/2016 230* 65 - 99 mg/dL Final  . Glucose-Capillary 06/02/2016 185* 65 - 99 mg/dL Final  . Glucose-Capillary 06/02/2016 218* 65 - 99 mg/dL Final  . Comment 1 06/02/2016 Notify RN   Final  . Comment 2 06/02/2016 Document in Chart   Final  . Glucose-Capillary 06/02/2016 184* 65 - 99 mg/dL Final  . Comment 1 06/02/2016 Notify RN   Final  . Comment 2 06/02/2016 Document in Chart   Final  . Sodium 06/03/2016  132* 135 - 145 mmol/L Final  . Potassium 06/03/2016 4.9  3.5 - 5.1 mmol/L Final  . Chloride 06/03/2016 102  101 - 111 mmol/L Final  . CO2 06/03/2016 25  22 - 32 mmol/L Final  . Glucose, Bld 06/03/2016 193* 65 - 99 mg/dL Final  . BUN 06/03/2016 24* 6 - 20 mg/dL Final  . Creatinine, Ser 06/03/2016 0.92  0.61 - 1.24 mg/dL Final  . Calcium 06/03/2016 8.7* 8.9 - 10.3 mg/dL Final  . GFR calc non Af Amer 06/03/2016 >60  >60 mL/min Final  . GFR calc Af Amer 06/03/2016 >60  >60 mL/min Final   Comment: (NOTE) The eGFR has been calculated using the CKD EPI equation. This calculation has not been  validated in all clinical situations. eGFR's persistently <60 mL/min signify possible Chronic Kidney Disease.   . Anion gap 06/03/2016 5  5 - 15 Final  . Glucose-Capillary 06/02/2016 324* 65 - 99 mg/dL Final  . Glucose-Capillary 06/03/2016 197* 65 - 99 mg/dL Final  . Glucose-Capillary 06/03/2016 153* 65 - 99 mg/dL Final  . Activated Clotting Time 06/03/2016 235  seconds Final  . Activated Clotting Time 06/03/2016 213  seconds Final  . Activated Clotting Time 06/03/2016 197  seconds Final  . Activated Clotting Time 06/03/2016 235  seconds Final  . Activated Clotting Time 06/03/2016 252  seconds Final  . Sodium 06/04/2016 133* 135 - 145 mmol/L Final  . Potassium 06/04/2016 5.1  3.5 - 5.1 mmol/L Final  . Chloride 06/04/2016 101  101 - 111 mmol/L Final  . CO2 06/04/2016 26  22 - 32 mmol/L Final  . Glucose, Bld 06/04/2016 186* 65 - 99 mg/dL Final  . BUN 06/04/2016 13  6 - 20 mg/dL Final  . Creatinine, Ser 06/04/2016 0.94  0.61 - 1.24 mg/dL Final  . Calcium 06/04/2016 9.1  8.9 - 10.3 mg/dL Final  . GFR calc non Af Amer 06/04/2016 >60  >60 mL/min Final  . GFR calc Af Amer 06/04/2016 >60  >60 mL/min Final   Comment: (NOTE) The eGFR has been calculated using the CKD EPI equation. This calculation has not been validated in all clinical situations. eGFR's persistently <60 mL/min signify possible Chronic Kidney Disease.   . Anion gap 06/04/2016 6  5 - 15 Final  . WBC 06/04/2016 11.0* 4.0 - 10.5 K/uL Final  . RBC 06/04/2016 3.38* 4.22 - 5.81 MIL/uL Final  . Hemoglobin 06/04/2016 8.2* 13.0 - 17.0 g/dL Final  . HCT 06/04/2016 27.7* 39.0 - 52.0 % Final  . MCV 06/04/2016 82.0  78.0 - 100.0 fL Final  . MCH 06/04/2016 24.3* 26.0 - 34.0 pg Final  . MCHC 06/04/2016 29.6* 30.0 - 36.0 g/dL Final  . RDW 06/04/2016 16.5* 11.5 - 15.5 % Final  . Platelets 06/04/2016 384  150 - 400 K/uL Final  . Glucose-Capillary 06/03/2016 170* 65 - 99 mg/dL Final  . Glucose-Capillary 06/03/2016 257* 65 - 99 mg/dL Final   . Comment 1 06/03/2016 Notify RN   Final  . Comment 2 06/03/2016 Document in Chart   Final  . Glucose-Capillary 06/04/2016 207* 65 - 99 mg/dL Final  . Comment 1 06/04/2016 Notify RN   Final  . Comment 2 06/04/2016 Document in Chart   Final  . Glucose-Capillary 06/04/2016 157* 65 - 99 mg/dL Final  . Comment 1 06/04/2016 Notify RN   Final  Admission on 05/11/2016, Discharged on 05/12/2016  Component Date Value Ref Range Status  . Glucose-Capillary 05/11/2016 553* 65 - 99 mg/dL Final  .  Comment 1 05/11/2016 Notify RN   Final  . Color, Urine 05/11/2016 YELLOW  YELLOW Final  . APPearance 05/11/2016 HAZY* CLEAR Final  . Specific Gravity, Urine 05/11/2016 1.015  1.005 - 1.030 Final  . pH 05/11/2016 5.0  5.0 - 8.0 Final  . Glucose, UA 05/11/2016 >=500* NEGATIVE mg/dL Final  . Hgb urine dipstick 05/11/2016 NEGATIVE  NEGATIVE Final  . Bilirubin Urine 05/11/2016 NEGATIVE  NEGATIVE Final  . Ketones, ur 05/11/2016 NEGATIVE  NEGATIVE mg/dL Final  . Protein, ur 05/11/2016 NEGATIVE  NEGATIVE mg/dL Final  . Nitrite 05/11/2016 NEGATIVE  NEGATIVE Final  . Leukocytes, UA 05/11/2016 NEGATIVE  NEGATIVE Final  . RBC / HPF 05/11/2016 0-5  0 - 5 RBC/hpf Final  . WBC, UA 05/11/2016 0-5  0 - 5 WBC/hpf Final  . Bacteria, UA 05/11/2016 NONE SEEN  NONE SEEN Final  . Squamous Epithelial / LPF 05/11/2016 0-5* NONE SEEN Final  . Mucous 05/11/2016 PRESENT   Final  . Granular Casts, UA 05/11/2016 PRESENT   Final  . Sodium 05/11/2016 124* 135 - 145 mmol/L Final  . Potassium 05/11/2016 4.0  3.5 - 5.1 mmol/L Final  . Chloride 05/11/2016 86* 101 - 111 mmol/L Final  . CO2 05/11/2016 27  22 - 32 mmol/L Final  . Glucose, Bld 05/11/2016 598* 65 - 99 mg/dL Final   Comment: CRITICAL RESULT CALLED TO, READ BACK BY AND VERIFIED WITH: CRUISE,JJ ON 05/11/16 AT 1445 BY LOY,C   . BUN 05/11/2016 35* 6 - 20 mg/dL Final  . Creatinine, Ser 05/11/2016 2.02* 0.61 - 1.24 mg/dL Final  . Calcium 05/11/2016 9.2  8.9 - 10.3 mg/dL Final    . Total Protein 05/11/2016 9.6* 6.5 - 8.1 g/dL Final  . Albumin 05/11/2016 3.3* 3.5 - 5.0 g/dL Final  . AST 05/11/2016 15  15 - 41 U/L Final  . ALT 05/11/2016 14* 17 - 63 U/L Final  . Alkaline Phosphatase 05/11/2016 81  38 - 126 U/L Final  . Total Bilirubin 05/11/2016 0.4  0.3 - 1.2 mg/dL Final  . GFR calc non Af Amer 05/11/2016 33* >60 mL/min Final  . GFR calc Af Amer 05/11/2016 39* >60 mL/min Final   Comment: (NOTE) The eGFR has been calculated using the CKD EPI equation. This calculation has not been validated in all clinical situations. eGFR's persistently <60 mL/min signify possible Chronic Kidney Disease.   . Anion gap 05/11/2016 11  5 - 15 Final  . WBC 05/11/2016 15.7* 4.0 - 10.5 K/uL Final  . RBC 05/11/2016 3.90* 4.22 - 5.81 MIL/uL Final  . Hemoglobin 05/11/2016 9.9* 13.0 - 17.0 g/dL Final  . HCT 05/11/2016 31.7* 39.0 - 52.0 % Final  . MCV 05/11/2016 81.3  78.0 - 100.0 fL Final  . MCH 05/11/2016 25.4* 26.0 - 34.0 pg Final  . MCHC 05/11/2016 31.2  30.0 - 36.0 g/dL Final  . RDW 05/11/2016 16.5* 11.5 - 15.5 % Final  . Platelets 05/11/2016 497* 150 - 400 K/uL Final  . Neutrophils Relative % 05/11/2016 85  % Final  . Neutro Abs 05/11/2016 13.3* 1.7 - 7.7 K/uL Final  . Lymphocytes Relative 05/11/2016 11  % Final  . Lymphs Abs 05/11/2016 1.8  0.7 - 4.0 K/uL Final  . Monocytes Relative 05/11/2016 4  % Final  . Monocytes Absolute 05/11/2016 0.7  0.1 - 1.0 K/uL Final  . Eosinophils Relative 05/11/2016 0  % Final  . Eosinophils Absolute 05/11/2016 0.0  0.0 - 0.7 K/uL Final  . Basophils Relative 05/11/2016 0  %  Final  . Basophils Absolute 05/11/2016 0.0  0.0 - 0.1 K/uL Final  . Glucose-Capillary 05/11/2016 461* 65 - 99 mg/dL Final  . Glucose-Capillary 05/11/2016 432* 65 - 99 mg/dL Final  . Comment 1 05/11/2016 Document in Chart   Final  Nursing Home on 04/24/2016  Component Date Value Ref Range Status  . Hemoglobin 04/19/2016 8.2* 13.5 - 17.5 g/dL Final  . HCT 04/19/2016 26* 41  - 53 % Final  . Platelets 04/19/2016 416* 150 - 399 K/L Final  . WBC 04/19/2016 10.9  10^3/mL Final    Dg Chest 2 View  Result Date: 05/31/2016 CLINICAL DATA:  right foot pain. Right great and 2nd toes are black and hard with no cap refill, rt lower leg pain. Patient complains of pain x 1 week after catching his foot in a string in his blanket. History of DM, HTN, CKD, PVD, CVA. EXAM: CHEST  2 VIEW COMPARISON:  11/25/2015 FINDINGS: The heart size and mediastinal contours are within normal limits. Both lungs are clear. No pleural effusion or pneumothorax. The skeletal structures are intact. IMPRESSION: No active cardiopulmonary disease. Electronically Signed   By: Lajean Manes M.D.   On: 05/31/2016 18:26   Dg Foot Complete Right  Result Date: 06/10/2016 CLINICAL DATA:  Lateral soft tissue wound draining purulent material. EXAM: RIGHT FOOT COMPLETE - 3+ VIEW COMPARISON:  05/31/2016 FINDINGS: No acute fracture or dislocation is noted no underlying bony destruction is seen. No gross soft tissue abnormality is noted. IMPRESSION: No acute abnormality seen. Electronically Signed   By: Inez Catalina M.D.   On: 06/10/2016 15:38   Dg Foot Complete Right  Result Date: 05/31/2016 CLINICAL DATA:  right foot pain. Right great and 2nd toes are black and hard with no cap refill, rt lower leg pain. Patient complains of pain x 1 week after catching his foot in a string in his blanket. History of DM, HTN, CKD, PVD, CVA. EXAM: RIGHT FOOT COMPLETE - 3+ VIEW COMPARISON:  None. FINDINGS: No fracture. No bone lesion. No bone resorption is seen to suggest osteomyelitis. There is minor asymmetric joint space narrowing at the first metatarsophalangeal joint. Small marginal osteophytes are also noted. No discrete erosions. Remaining joints are normally spaced and aligned. No soft tissue air.  There are vascular calcifications. IMPRESSION: 1. No fracture or dislocation. 2. No evidence of osteomyelitis.  No soft tissue air. 3.  Minor first metatarsophalangeal joint osteoarthritis. Electronically Signed   By: Lajean Manes M.D.   On: 05/31/2016 18:25     Assessment/Plan    Kahla Risdon S. Perlie Gold  [facility] [place], [place] [address] [phone] Cell (Monday-Friday 8 AM - 5 PM) [phone] After 5 PM and follow prompts  This encounter was created in error - please disregard.

## 2016-06-27 NOTE — ED Triage Notes (Signed)
Pt arrives EMS from [facility] for c/o stitches coming out of wound ar right foot where he had toes amputated 2 weeks ago. Also c/o foot pain.

## 2016-06-28 ENCOUNTER — Encounter: Payer: Self-pay | Admitting: Vascular Surgery

## 2016-07-01 ENCOUNTER — Encounter: Payer: Self-pay | Admitting: Internal Medicine

## 2016-07-01 ENCOUNTER — Non-Acute Institutional Stay (SKILLED_NURSING_FACILITY): Payer: Medicaid Other | Admitting: Internal Medicine

## 2016-07-01 DIAGNOSIS — R5381 Other malaise: Secondary | ICD-10-CM

## 2016-07-01 DIAGNOSIS — E1165 Type 2 diabetes mellitus with hyperglycemia: Secondary | ICD-10-CM | POA: Diagnosis not present

## 2016-07-01 DIAGNOSIS — D638 Anemia in other chronic diseases classified elsewhere: Secondary | ICD-10-CM | POA: Diagnosis not present

## 2016-07-01 DIAGNOSIS — I739 Peripheral vascular disease, unspecified: Secondary | ICD-10-CM

## 2016-07-01 DIAGNOSIS — IMO0002 Reserved for concepts with insufficient information to code with codable children: Secondary | ICD-10-CM

## 2016-07-01 DIAGNOSIS — Z89431 Acquired absence of right foot: Secondary | ICD-10-CM

## 2016-07-01 DIAGNOSIS — E1143 Type 2 diabetes mellitus with diabetic autonomic (poly)neuropathy: Secondary | ICD-10-CM

## 2016-07-01 DIAGNOSIS — I10 Essential (primary) hypertension: Secondary | ICD-10-CM

## 2016-07-01 NOTE — Progress Notes (Signed)
Patient ID: Cory Weber, male   DOB: Jun 18, 1950, 66 y.o.   MRN: [id number]    DATE: 07/01/2016  Location:    [facility] Room Number: 127 A Place of Service: SNF (31)   Extended Emergency Contact Information Primary Emergency Contact: Tye Savoy, [place] [address] [place] [employer] Phone: [phone] Relation: Sister Secondary Emergency Contact: Kerrin Mo, [place] [address] [place] [employer] Phone: [phone] Relation: Sister  Advanced Directive information Does Patient Have a Medical Advance Directive?: Yes, Type of Advance Directive: Out of facility DNR (pink MOST or yellow form), Pre-existing out of facility DNR order (yellow form or pink MOST form): Pink MOST form placed in chart (order not valid for inpatient use), Does patient want to make changes to medical advance directive?: No - Patient declined  Chief Complaint  Patient presents with  . Readmit To SNF    Readmission    HPI:  66 yo male long term resident seen today as a readmission into SNF following hospital stay for right foot wet gangrene/ischemic foot, deconditioning, post op fever, anemia of chronic disease, DM, hyperlipidemia, PAD, chronic constipation. He presented to the ED with severe right foot pain unrelieved with po opiates. He underwent right TMA on 06/13/16. Post op Hgb dropped to 6.6-->2 units PRBCs-->Hgb 8. He was tx with IV vanco/zosyn but both d/c'd with neg blood cx. WBCs peaked 15.8K-->10.7K; albumin 3.0; Cr 1.26. He presents to SNF for short term rehab followed by long term care.  He was sent to the ED on 06/27/16 due to c/a right TMA wound dehiscence. Stump incisional site determined to be intact. He was sent back to SNF. Today he reports c/a right foot dsg. No purulent d/c. No f/c. He has appt with vascular sx coming up. He has discomfort on plantar surface of TMA site. CBG 263  Chronic diastolic heart failure - EF 50-55% (02-15-16).  Takes demadex 20 mg daily; coreg 3.125 mg twice daily; lisinopril 5 mg daily   Hypertension - stable on coreg 3.125 mg twice daily; lisinopril 5 mg daily.   DM - uncontrolled. A1c 9.0%. CBG 385. No low BS reactions. Takes lantus 50 units nightly and novolog 22 units with meals; metformin 500 mg daily. He has neuropathy and takes gabapentin   Constipation - stable on miralax daily senna s twice daily   Chronic Gout - no recent flare on allopurinol 200 mg daily   Hx Anemia of chronic disease - stable. Hgb 8. Takes iron three times daily   Dyslipidemia - stable on zocor 20 mg daily   BPH - stable on flomax 0.4 mg daily   Chronic pain syndrome/Right lower extremity pain - stable on neurontin 600 mg twice daily and 1200 mg nightly;  robaxin 500 mg every 6 hours as needed; oxycodone 10 - 15 mg every 4 hours as needed; MS contin 15 mg twice daily. He does benefit from opioid tx and benefits outweigh risks.   Past Medical History:  Diagnosis Date  . Anemia   . Asthma   . BPH (benign prostatic hyperplasia)   . Cellulitis and abscess of foot 07/2015  . Chronic kidney disease    CKD stage 3  . CVA (cerebral vascular accident) ([place]) 2015   denies residual on 08/15/2015  . Hepatitis C    states he was diagnosed in 2007 or 2007 while living in [place], [place]  .  Hyperlipidemia   . Hypertension   . Ischemia of foot 05/31/2016  . Peripheral neuropathy   . Pneumonia X 1  . PVD (peripheral vascular disease) ([place])   . Type II diabetes mellitus ([place])   . Wet gangrene ([place]) 06/10/2016    Past Surgical History:  Procedure Laterality Date  . ABDOMINAL AORTOGRAM N/A 06/03/2016   Procedure: Abdominal Aortogram;  Surgeon: Waynetta Sandy, MD;  Location: [facility];  Service: Cardiovascular;  Laterality: N/A;  . AMPUTATION Left 08/16/2015   Procedure: LEFT BELOW THE KNEE AMPUTATION ;  Surgeon: Serafina Mitchell, MD;  Location: [facility];  Service: Vascular;  Laterality: Left;  . BACK SURGERY     . BELOW KNEE LEG AMPUTATION Left 08/16/2015  . BIOPSY N/A 09/03/2012   Procedure: BIOPSY;  Surgeon: Daneil Dolin, MD;  Location: [facility];  Service: Endoscopy;  Laterality: N/A;  gastric and gastric mucosa  . BIOPSY N/A 12/03/2012   Procedure: BIOPSY;  Surgeon: Daneil Dolin, MD;  Location: [facility];  Service: Endoscopy;  Laterality: N/A;  . COLONOSCOPY WITH PROPOFOL N/A 09/03/2012   HDQ:QIWLNLG polyp-removed as outlined above. Prominent internal hemorrhoids. Tubular adenoma  . ESOPHAGOGASTRODUODENOSCOPY (EGD) WITH PROPOFOL N/A 09/03/2012   XQJ:JHERDE hernia. Gastric diverticulum. Gastric ulcers with associated erosions. Duodenal erosions. Status post gastric biopsy. H.PYLORI gastritis   . ESOPHAGOGASTRODUODENOSCOPY (EGD) WITH PROPOFOL N/A 12/03/2012   Dr. Gala Romney: gastric diverticulum, gastric erosions and scar. Previously noted gastric ulcer completed healed. Biopsy without H.pylori.   . ESOPHAGOGASTRODUODENOSCOPY (EGD) WITH PROPOFOL N/A 01/23/2016   Procedure: ESOPHAGOGASTRODUODENOSCOPY (EGD) WITH PROPOFOL;  Surgeon: Doran Stabler, MD;  Location: [facility];  Service: Gastroenterology;  Laterality: N/A;  . LIVER BIOPSY  2005   Done in [place], [place]. Chronic hepatitis with mild periportal inflammation, lobular unicellular necrosis and portal fibrosis. Grade 2, stage 1-2.  . LOWER EXTREMITY ANGIOGRAPHY Right 06/03/2016   Procedure: Lower Extremity Angiography;  Surgeon: Waynetta Sandy, MD;  Location: [facility];  Service: Cardiovascular;  Laterality: Right;  . MAXIMUM ACCESS (MAS)POSTERIOR LUMBAR INTERBODY FUSION (PLIF) 1 LEVEL N/A 11/25/2013   Procedure: FOR MAXIMUM ACCESS (MAS) POSTERIOR LUMBAR INTERBODY FUSION (PLIF) 1 LEVEL;  Surgeon: Eustace Moore, MD;  Location: [facility];  Service: Neurosurgery;  Laterality: N/A;  FOR MAXIMUM ACCESS (MAS) POSTERIOR LUMBAR INTERBODY FUSION (PLIF) 1 LEVEL LUMBAR 3-4  . PERIPHERAL VASCULAR ATHERECTOMY Right 06/03/2016   Procedure:  Peripheral Vascular Atherectomy;  Surgeon: Waynetta Sandy, MD;  Location: [facility];  Service: Cardiovascular;  Laterality: Right;  SFA WITH STENT  . PERIPHERAL VASCULAR CATHETERIZATION Left 08/01/2015   Procedure: Lower Extremity Angiography;  Surgeon: Serafina Mitchell, MD;  Location: [facility];  Service: Cardiovascular;  Laterality: Left;  . PERIPHERAL VASCULAR CATHETERIZATION N/A 08/01/2015   Procedure: Abdominal Aortogram;  Surgeon: Serafina Mitchell, MD;  Location: [facility];  Service: Cardiovascular;  Laterality: N/A;  . PERIPHERAL VASCULAR CATHETERIZATION N/A 08/08/2015   Procedure: Abdominal Aortogram w/Lower Extremity;  Surgeon: Serafina Mitchell, MD;  Location: [facility];  Service: Cardiovascular;  Laterality: N/A;  . PERIPHERAL VASCULAR CATHETERIZATION Left 08/08/2015   Procedure: Peripheral Vascular Balloon Angioplasty;  Surgeon: Serafina Mitchell, MD;  Location: [facility];  Service: Cardiovascular;  Laterality: Left;  left popiteal artery, left peronealtrunk, left post tibial  . POLYPECTOMY N/A 09/03/2012   Procedure: POLYPECTOMY;  Surgeon: Daneil Dolin, MD;  [facility];  Service: Endoscopy;  Laterality: N/A;  cecal polyp  . TONSILLECTOMY    . TRANSMETATARSAL AMPUTATION Right 06/13/2016   Procedure: RIGHT TRANSMETATARSAL AMPUTATION;  Surgeon: Waynetta Sandy, MD;  Location: [facility];  Service: Vascular;  Laterality: Right;    Patient Care Team: Iona Beard, MD as PCP - General (Family Medicine) Gala Romney, Cristopher Estimable, MD as Consulting Physician (Gastroenterology) Herminio Commons, MD as Attending Physician (Cardiology) Meredith Staggers, MD as Consulting Physician (Physical Medicine and Rehabilitation)  Social History   Social History  . Marital status: Divorced    Spouse name: N/A  . Number of children: 1  . Years of education: N/A   Occupational History  . trying to get disability    Social History Main Topics  .  Smoking status: Former Smoker    Packs/day: 1.00    Years: 47.00    Types: Cigarettes    Quit date: 07/13/2015  . Smokeless tobacco: Never Used  . Alcohol use No     Comment:  none 08/15/2015 "stopped drinking 3-4 years ago"  . Drug use: No     Comment: 08/15/2015 "last used cocaine 3-4 months ago"  . Sexual activity: No   Other Topics Concern  . Not on file   Social History Narrative  . No narrative on file     reports that he quit smoking about a year ago. His smoking use included Cigarettes. He has a 47.00 pack-year smoking history. He has never used smokeless tobacco. He reports that he does not drink alcohol or use drugs.  Family History  Problem Relation Age of Onset  . Breast cancer Mother   . Heart failure Mother   . Diabetes Father   . Hypertension Father   . Heart disease Father   . Hyperlipidemia Father   . Diabetes Sister   . Hypertension Sister   . Breast cancer Sister   . Colon cancer Neg Hx   . Liver disease Neg Hx    Family Status  Relation Status  . Mother Deceased  . Father Deceased  . Sister (Not Specified)  . Sister (Not Specified)  . Neg Hx (Not Specified)    Immunization History  Administered Date(s) Administered  . Influenza,inj,Quad PF,36+ Mos 11/24/2013  . Influenza-Unspecified 11/16/2015  . PPD Test 09/21/2015, 10/27/2015  . Pneumococcal Polysaccharide-23 11/24/2013    No Known Allergies  Medications: Patient's Medications  New Prescriptions   No medications on file  Previous Medications   ALLOPURINOL (ZYLOPRIM) 100 MG TABLET    Take 200 mg by mouth daily after supper.    ASPIRIN 325 MG TABLET    Take 325 mg by mouth daily.   CARVEDILOL (COREG) 3.125 MG TABLET    Take 1 tablet (3.125 mg total) by mouth 2 (two) times daily with a meal.   CEPHALEXIN (KEFLEX) 500 MG CAPSULE    Take 1 capsule (500 mg total) by mouth 4 (four) times daily.   CLOPIDOGREL (PLAVIX) 75 MG TABLET    Take 1 tablet (75 mg total) by mouth daily with breakfast.    FERROUS SULFATE 325 (65 FE) MG TABLET    Take 325 mg by mouth 3 (three) times daily with meals.   GABAPENTIN (NEURONTIN) 600 MG TABLET    Take 600-1,200 mg by mouth See admin instructions. Take 1 tablet (600 mg) by mouth twice daily - morning and afternoon and take 2 tablets (1200 mg) at bedtime   INSULIN ASPART (NOVOLOG) 100 UNIT/ML INJECTION    Inject 18 Units into the  skin 3 (three) times daily before meals.    INSULIN GLARGINE (LANTUS) 100 UNIT/ML INJECTION    Inject 0.5 mLs (50 Units total) into the skin at bedtime.   LISINOPRIL (PRINIVIL,ZESTRIL) 5 MG TABLET    Take 5 mg by mouth daily.    MAGNESIUM HYDROXIDE (MILK OF MAGNESIA) 400 MG/5ML SUSPENSION    Take 30 mLs by mouth daily as needed for mild constipation.   METFORMIN (GLUCOPHAGE) 500 MG TABLET    Take 500 mg by mouth daily with breakfast.   METHOCARBAMOL (ROBAXIN) 500 MG TABLET    Take 500 mg by mouth every 6 (six) hours as needed for muscle spasms.    MORPHINE (MS CONTIN) 15 MG 12 HR TABLET    Take 1 tablet (15 mg total) by mouth every 12 (twelve) hours.   OXYCODONE HCL 10 MG TABS    Take 1-1.5 tablets (10-15 mg total) by mouth every 4 (four) hours as needed.   POLYETHYLENE GLYCOL (MIRALAX / GLYCOLAX) PACKET    Take 17 g by mouth daily.   SENNA-DOCUSATE (SENOKOT-S) 8.6-50 MG TABLET    Take 1 tablet by mouth 2 (two) times daily.   SIMVASTATIN (ZOCOR) 20 MG TABLET    Take 20 mg by mouth daily.    TAMSULOSIN (FLOMAX) 0.4 MG CAPS CAPSULE    Take 1 capsule (0.4 mg total) by mouth daily.   TORSEMIDE (DEMADEX) 20 MG TABLET    Take 20 mg by mouth daily.   Modified Medications   No medications on file  Discontinued Medications   No medications on file    Review of Systems  Cardiovascular: Positive for leg swelling.  Musculoskeletal: Positive for arthralgias and gait problem.  Skin: Positive for wound.  All other systems reviewed and are negative.   Vitals:   07/01/16 1501  BP: (!) 142/66  Pulse: 80  Resp: 18  Temp: 97.5 F (36.4  C)  TempSrc: Oral  SpO2: 94%   There is no height or weight on file to calculate BMI.  Physical Exam  Constitutional: He is oriented to person, place, and time. He appears well-developed and well-nourished.  Sitting in w/c in NAD  HENT:  Mouth/Throat: Oropharynx is clear and moist. No oropharyngeal exudate.  MMM. No oral thrush  Eyes: Pupils are equal, round, and reactive to light. No scleral icterus.  Neck: Neck supple. Carotid bruit is not present. No thyromegaly present.  Cardiovascular: Normal rate, regular rhythm and intact distal pulses.  Exam reveals no gallop and no friction rub.   Murmur (2/6 SEM) heard. Pulses:      Dorsalis pedis pulses are 1+ on the right side. Left dorsalis pedis pulse not accessible.       Posterior tibial pulses are 1+ on the right side. Left posterior tibial pulse not accessible.  Left BKA; +1 pitting RLE edema. No right calf TTP. Right TMA  Pulmonary/Chest: Effort normal and breath sounds normal. He has no wheezes. He has no rales. He exhibits no tenderness.  Abdominal: Soft. Bowel sounds are normal. He exhibits no distension, no abdominal bruit, no pulsatile midline mass and no mass. There is no hepatomegaly. There is no tenderness. There is no rebound and no guarding.  Musculoskeletal: He exhibits edema, tenderness and deformity.  Left BKA; right TMA  Lymphadenopathy:    He has no cervical adenopathy.  Neurological: He is alert and oriented to person, place, and time.  Skin: Skin is warm, dry and intact. No rash noted.     Psychiatric:  He has a normal mood and affect. His behavior is normal. Thought content normal.     Labs reviewed: Admission on 06/27/2016, Discharged on 06/27/2016  Component Date Value Ref Range Status  . WBC 06/27/2016 9.6  4.0 - 10.5 K/uL Final  . RBC 06/27/2016 2.68* 4.22 - 5.81 MIL/uL Final  . Hemoglobin 06/27/2016 7.0* 13.0 - 17.0 g/dL Final  . HCT 06/27/2016 22.7* 39.0 - 52.0 % Final  . MCV 06/27/2016 84.7  78.0 -  100.0 fL Final  . MCH 06/27/2016 26.1  26.0 - 34.0 pg Final  . MCHC 06/27/2016 30.8  30.0 - 36.0 g/dL Final  . RDW 06/27/2016 16.7* 11.5 - 15.5 % Final  . Platelets 06/27/2016 407* 150 - 400 K/uL Final  . Neutrophils Relative % 06/27/2016 67  % Final  . Neutro Abs 06/27/2016 6.4  1.7 - 7.7 K/uL Final  . Lymphocytes Relative 06/27/2016 25  % Final  . Lymphs Abs 06/27/2016 2.4  0.7 - 4.0 K/uL Final  . Monocytes Relative 06/27/2016 5  % Final  . Monocytes Absolute 06/27/2016 0.5  0.1 - 1.0 K/uL Final  . Eosinophils Relative 06/27/2016 3  % Final  . Eosinophils Absolute 06/27/2016 0.3  0.0 - 0.7 K/uL Final  . Basophils Relative 06/27/2016 0  % Final  . Basophils Absolute 06/27/2016 0.0  0.0 - 0.1 K/uL Final  . Sodium 06/27/2016 134* 135 - 145 mmol/L Final  . Potassium 06/27/2016 4.2  3.5 - 5.1 mmol/L Final  . Chloride 06/27/2016 99* 101 - 111 mmol/L Final  . CO2 06/27/2016 28  22 - 32 mmol/L Final  . Glucose, Bld 06/27/2016 218* 65 - 99 mg/dL Final  . BUN 06/27/2016 11  6 - 20 mg/dL Final  . Creatinine, Ser 06/27/2016 1.00  0.61 - 1.24 mg/dL Final  . Calcium 06/27/2016 8.9  8.9 - 10.3 mg/dL Final  . GFR calc non Af Amer 06/27/2016 >60  >60 mL/min Final  . GFR calc Af Amer 06/27/2016 >60  >60 mL/min Final   Comment: (NOTE) The eGFR has been calculated using the CKD EPI equation. This calculation has not been validated in all clinical situations. eGFR's persistently <60 mL/min signify possible Chronic Kidney Disease.   . Anion gap 06/27/2016 7  5 - 15 Final  Nursing Home on 06/21/2016  Component Date Value Ref Range Status  . Hemoglobin 06/18/2016 8.0* 13.5 - 17.5 g/dL Final  . HCT 06/18/2016 25* 41 - 53 % Final  . Neutrophils Absolute 06/18/2016 8  /L Final  . Platelets 06/18/2016 491* 150 - 399 K/L Final  . WBC 06/18/2016 10.7  10^3/mL Final  Admission on 06/10/2016, Discharged on 06/17/2016  No results displayed because visit has over 200 results.    Admission on 05/31/2016,  Discharged on 06/04/2016  Component Date Value Ref Range Status  . Sodium 05/31/2016 130* 135 - 145 mmol/L Final  . Potassium 05/31/2016 4.8  3.5 - 5.1 mmol/L Final  . Chloride 05/31/2016 96* 101 - 111 mmol/L Final  . CO2 05/31/2016 27  22 - 32 mmol/L Final  . Glucose, Bld 05/31/2016 248* 65 - 99 mg/dL Final  . BUN 05/31/2016 21* 6 - 20 mg/dL Final  . Creatinine, Ser 05/31/2016 1.13  0.61 - 1.24 mg/dL Final  . Calcium 05/31/2016 9.2  8.9 - 10.3 mg/dL Final  . Total Protein 05/31/2016 9.3* 6.5 - 8.1 g/dL Final  . Albumin 05/31/2016 3.5  3.5 - 5.0 g/dL Final  . AST 05/31/2016 12* 15 - 41  U/L Final  . ALT 05/31/2016 16* 17 - 63 U/L Final  . Alkaline Phosphatase 05/31/2016 93  38 - 126 U/L Final  . Total Bilirubin 05/31/2016 0.5  0.3 - 1.2 mg/dL Final  . GFR calc non Af Amer 05/31/2016 >60  >60 mL/min Final  . GFR calc Af Amer 05/31/2016 >60  >60 mL/min Final   Comment: (NOTE) The eGFR has been calculated using the CKD EPI equation. This calculation has not been validated in all clinical situations. eGFR's persistently <60 mL/min signify possible Chronic Kidney Disease.   . Anion gap 05/31/2016 7  5 - 15 Final  . Lipase 05/31/2016 17  11 - 51 U/L Final  . WBC 05/31/2016 16.0* 4.0 - 10.5 K/uL Final  . RBC 05/31/2016 3.93* 4.22 - 5.81 MIL/uL Final  . Hemoglobin 05/31/2016 9.9* 13.0 - 17.0 g/dL Final  . HCT 05/31/2016 31.7* 39.0 - 52.0 % Final  . MCV 05/31/2016 80.7  78.0 - 100.0 fL Final  . MCH 05/31/2016 25.2* 26.0 - 34.0 pg Final  . MCHC 05/31/2016 31.2  30.0 - 36.0 g/dL Final  . RDW 05/31/2016 16.3* 11.5 - 15.5 % Final  . Platelets 05/31/2016 407* 150 - 400 K/uL Final  . Neutrophils Relative % 05/31/2016 74  % Final  . Neutro Abs 05/31/2016 11.9* 1.7 - 7.7 K/uL Final  . Lymphocytes Relative 05/31/2016 19  % Final  . Lymphs Abs 05/31/2016 3.0  0.7 - 4.0 K/uL Final  . Monocytes Relative 05/31/2016 6  % Final  . Monocytes Absolute 05/31/2016 1.0  0.1 - 1.0 K/uL Final  . Eosinophils  Relative 05/31/2016 1  % Final  . Eosinophils Absolute 05/31/2016 0.1  0.0 - 0.7 K/uL Final  . Basophils Relative 05/31/2016 0  % Final  . Basophils Absolute 05/31/2016 0.0  0.0 - 0.1 K/uL Final  . Prothrombin Time 05/31/2016 15.0  11.4 - 15.2 seconds Final  . INR 05/31/2016 1.17   Final  . Lactic Acid, Venous 05/31/2016 1.0  0.5 - 1.9 mmol/L Final  . Glucose-Capillary 05/31/2016 243* 65 - 99 mg/dL Final  . Sodium 06/01/2016 131* 135 - 145 mmol/L Final  . Potassium 06/01/2016 4.4  3.5 - 5.1 mmol/L Final  . Chloride 06/01/2016 98* 101 - 111 mmol/L Final  . CO2 06/01/2016 26  22 - 32 mmol/L Final  . Glucose, Bld 06/01/2016 163* 65 - 99 mg/dL Final  . BUN 06/01/2016 21* 6 - 20 mg/dL Final  . Creatinine, Ser 06/01/2016 1.37* 0.61 - 1.24 mg/dL Final  . Calcium 06/01/2016 9.3  8.9 - 10.3 mg/dL Final  . GFR calc non Af Amer 06/01/2016 53* >60 mL/min Final  . GFR calc Af Amer 06/01/2016 >60  >60 mL/min Final   Comment: (NOTE) The eGFR has been calculated using the CKD EPI equation. This calculation has not been validated in all clinical situations. eGFR's persistently <60 mL/min signify possible Chronic Kidney Disease.   . Anion gap 06/01/2016 7  5 - 15 Final  . WBC 06/01/2016 12.0* 4.0 - 10.5 K/uL Final  . RBC 06/01/2016 3.84* 4.22 - 5.81 MIL/uL Final  . Hemoglobin 06/01/2016 9.8* 13.0 - 17.0 g/dL Final  . HCT 06/01/2016 31.0* 39.0 - 52.0 % Final  . MCV 06/01/2016 80.7  78.0 - 100.0 fL Final  . MCH 06/01/2016 25.5* 26.0 - 34.0 pg Final  . MCHC 06/01/2016 31.6  30.0 - 36.0 g/dL Final  . RDW 06/01/2016 16.9* 11.5 - 15.5 % Final  . Platelets 06/01/2016 437* 150 -  400 K/uL Final  . Color, Urine 06/01/2016 YELLOW  YELLOW Final  . APPearance 06/01/2016 HAZY* CLEAR Final  . Specific Gravity, Urine 06/01/2016 1.016  1.005 - 1.030 Final  . pH 06/01/2016 5.0  5.0 - 8.0 Final  . Glucose, UA 06/01/2016 50* NEGATIVE mg/dL Final  . Hgb urine dipstick 06/01/2016 NEGATIVE  NEGATIVE Final  .  Bilirubin Urine 06/01/2016 NEGATIVE  NEGATIVE Final  . Ketones, ur 06/01/2016 NEGATIVE  NEGATIVE mg/dL Final  . Protein, ur 06/01/2016 NEGATIVE  NEGATIVE mg/dL Final  . Nitrite 06/01/2016 NEGATIVE  NEGATIVE Final  . Leukocytes, UA 06/01/2016 NEGATIVE  NEGATIVE Final  . RBC / HPF 06/01/2016 0-5  0 - 5 RBC/hpf Final  . WBC, UA 06/01/2016 0-5  0 - 5 WBC/hpf Final  . Bacteria, UA 06/01/2016 NONE SEEN  NONE SEEN Final  . Squamous Epithelial / LPF 06/01/2016 0-5* NONE SEEN Final  . Mucous 06/01/2016 PRESENT   Final  . Hyaline Casts, UA 06/01/2016 PRESENT   Final  . Specimen Description 06/01/2016 URINE, CLEAN CATCH   Final  . Special Requests 06/01/2016 NONE   Final  . Culture 06/01/2016 <10,000 COLONIES/mL INSIGNIFICANT GROWTH*  Final  . Report Status 06/01/2016 06/02/2016 FINAL   Final  . Specimen Description 05/31/2016 BLOOD BLOOD RIGHT ARM   Final  . Special Requests 05/31/2016 BOTTLES DRAWN AEROBIC AND ANAEROBIC Blood Culture adequate volume   Final  . Culture 05/31/2016 NO GROWTH 5 DAYS   Final  . Report Status 05/31/2016 06/06/2016 FINAL   Final  . Specimen Description 05/31/2016 BLOOD LEFT ANTECUBITAL   Final  . Special Requests 05/31/2016 IN PEDIATRIC BOTTLE Blood Culture adequate volume   Final  . Culture 05/31/2016 NO GROWTH 5 DAYS   Final  . Report Status 05/31/2016 06/06/2016 FINAL   Final  . Hgb A1c MFr Bld 06/01/2016 9.0* 4.8 - 5.6 % Final   Comment: (NOTE)         Pre-diabetes: 5.7 - 6.4         Diabetes: >6.4         Glycemic control for adults with diabetes: <7.0   . Mean Plasma Glucose 06/01/2016 212  mg/dL Final   Comment: (NOTE) Performed At: [facility] [place], [place] [id number] Lindon Romp MD NW:[id number]   . Glucose-Capillary 05/31/2016 265* 65 - 99 mg/dL Final  . Comment 1 05/31/2016 Notify RN   Final  . Comment 2 05/31/2016 Document in Chart   Final  . MRSA by PCR 06/01/2016 NEGATIVE  NEGATIVE Final   Comment:        The  GeneXpert MRSA Assay (FDA approved for NASAL specimens only), is one component of a comprehensive MRSA colonization surveillance program. It is not intended to diagnose MRSA infection nor to guide or monitor treatment for MRSA infections.   . Glucose-Capillary 06/01/2016 180* 65 - 99 mg/dL Final  . Comment 1 06/01/2016 Notify RN   Final  . Comment 2 06/01/2016 Document in Chart   Final  . Right super femoral prox sys PSV 06/01/2016 59  cm/s Final  . Right super femoral mid sys PSV 06/01/2016 -11  cm/s Final  . Right super femoral dist sys PSV 06/01/2016 -13  cm/s Final  . Right popliteal dist sys PSV 06/01/2016 -20  cm/s Final  . Glucose-Capillary 06/01/2016 194* 65 - 99 mg/dL Final  . Comment 1 06/01/2016 Notify RN   Final  . Comment 2 06/01/2016 Document in Chart   Final  .  Glucose-Capillary 06/01/2016 156* 65 - 99 mg/dL Final  . Comment 1 06/01/2016 Notify RN   Final  . Comment 2 06/01/2016 Document in Chart   Final  . WBC 06/02/2016 11.0* 4.0 - 10.5 K/uL Final  . RBC 06/02/2016 3.45* 4.22 - 5.81 MIL/uL Final  . Hemoglobin 06/02/2016 8.4* 13.0 - 17.0 g/dL Final  . HCT 06/02/2016 28.3* 39.0 - 52.0 % Final  . MCV 06/02/2016 82.0  78.0 - 100.0 fL Final  . MCH 06/02/2016 24.3* 26.0 - 34.0 pg Final  . MCHC 06/02/2016 29.7* 30.0 - 36.0 g/dL Final  . RDW 06/02/2016 16.6* 11.5 - 15.5 % Final  . Platelets 06/02/2016 413* 150 - 400 K/uL Final  . Sodium 06/02/2016 131* 135 - 145 mmol/L Final  . Potassium 06/02/2016 4.7  3.5 - 5.1 mmol/L Final  . Chloride 06/02/2016 97* 101 - 111 mmol/L Final  . CO2 06/02/2016 26  22 - 32 mmol/L Final  . Glucose, Bld 06/02/2016 151* 65 - 99 mg/dL Final  . BUN 06/02/2016 32* 6 - 20 mg/dL Final  . Creatinine, Ser 06/02/2016 1.55* 0.61 - 1.24 mg/dL Final  . Calcium 06/02/2016 8.8* 8.9 - 10.3 mg/dL Final  . Total Protein 06/02/2016 8.3* 6.5 - 8.1 g/dL Final  . Albumin 06/02/2016 2.9* 3.5 - 5.0 g/dL Final  . AST 06/02/2016 21  15 - 41 U/L Final  . ALT  06/02/2016 20  17 - 63 U/L Final  . Alkaline Phosphatase 06/02/2016 94  38 - 126 U/L Final  . Total Bilirubin 06/02/2016 0.5  0.3 - 1.2 mg/dL Final  . GFR calc non Af Amer 06/02/2016 46* >60 mL/min Final  . GFR calc Af Amer 06/02/2016 53* >60 mL/min Final   Comment: (NOTE) The eGFR has been calculated using the CKD EPI equation. This calculation has not been validated in all clinical situations. eGFR's persistently <60 mL/min signify possible Chronic Kidney Disease.   . Anion gap 06/02/2016 8  5 - 15 Final  . Glucose-Capillary 06/01/2016 230* 65 - 99 mg/dL Final  . Glucose-Capillary 06/02/2016 185* 65 - 99 mg/dL Final  . Glucose-Capillary 06/02/2016 218* 65 - 99 mg/dL Final  . Comment 1 06/02/2016 Notify RN   Final  . Comment 2 06/02/2016 Document in Chart   Final  . Glucose-Capillary 06/02/2016 184* 65 - 99 mg/dL Final  . Comment 1 06/02/2016 Notify RN   Final  . Comment 2 06/02/2016 Document in Chart   Final  . Sodium 06/03/2016 132* 135 - 145 mmol/L Final  . Potassium 06/03/2016 4.9  3.5 - 5.1 mmol/L Final  . Chloride 06/03/2016 102  101 - 111 mmol/L Final  . CO2 06/03/2016 25  22 - 32 mmol/L Final  . Glucose, Bld 06/03/2016 193* 65 - 99 mg/dL Final  . BUN 06/03/2016 24* 6 - 20 mg/dL Final  . Creatinine, Ser 06/03/2016 0.92  0.61 - 1.24 mg/dL Final  . Calcium 06/03/2016 8.7* 8.9 - 10.3 mg/dL Final  . GFR calc non Af Amer 06/03/2016 >60  >60 mL/min Final  . GFR calc Af Amer 06/03/2016 >60  >60 mL/min Final   Comment: (NOTE) The eGFR has been calculated using the CKD EPI equation. This calculation has not been validated in all clinical situations. eGFR's persistently <60 mL/min signify possible Chronic Kidney Disease.   . Anion gap 06/03/2016 5  5 - 15 Final  . Glucose-Capillary 06/02/2016 324* 65 - 99 mg/dL Final  . Glucose-Capillary 06/03/2016 197* 65 - 99 mg/dL Final  .  Glucose-Capillary 06/03/2016 153* 65 - 99 mg/dL Final  . Activated Clotting Time 06/03/2016 235  seconds  Final  . Activated Clotting Time 06/03/2016 213  seconds Final  . Activated Clotting Time 06/03/2016 197  seconds Final  . Activated Clotting Time 06/03/2016 235  seconds Final  . Activated Clotting Time 06/03/2016 252  seconds Final  . Sodium 06/04/2016 133* 135 - 145 mmol/L Final  . Potassium 06/04/2016 5.1  3.5 - 5.1 mmol/L Final  . Chloride 06/04/2016 101  101 - 111 mmol/L Final  . CO2 06/04/2016 26  22 - 32 mmol/L Final  . Glucose, Bld 06/04/2016 186* 65 - 99 mg/dL Final  . BUN 06/04/2016 13  6 - 20 mg/dL Final  . Creatinine, Ser 06/04/2016 0.94  0.61 - 1.24 mg/dL Final  . Calcium 06/04/2016 9.1  8.9 - 10.3 mg/dL Final  . GFR calc non Af Amer 06/04/2016 >60  >60 mL/min Final  . GFR calc Af Amer 06/04/2016 >60  >60 mL/min Final   Comment: (NOTE) The eGFR has been calculated using the CKD EPI equation. This calculation has not been validated in all clinical situations. eGFR's persistently <60 mL/min signify possible Chronic Kidney Disease.   . Anion gap 06/04/2016 6  5 - 15 Final  . WBC 06/04/2016 11.0* 4.0 - 10.5 K/uL Final  . RBC 06/04/2016 3.38* 4.22 - 5.81 MIL/uL Final  . Hemoglobin 06/04/2016 8.2* 13.0 - 17.0 g/dL Final  . HCT 06/04/2016 27.7* 39.0 - 52.0 % Final  . MCV 06/04/2016 82.0  78.0 - 100.0 fL Final  . MCH 06/04/2016 24.3* 26.0 - 34.0 pg Final  . MCHC 06/04/2016 29.6* 30.0 - 36.0 g/dL Final  . RDW 06/04/2016 16.5* 11.5 - 15.5 % Final  . Platelets 06/04/2016 384  150 - 400 K/uL Final  . Glucose-Capillary 06/03/2016 170* 65 - 99 mg/dL Final  . Glucose-Capillary 06/03/2016 257* 65 - 99 mg/dL Final  . Comment 1 06/03/2016 Notify RN   Final  . Comment 2 06/03/2016 Document in Chart   Final  . Glucose-Capillary 06/04/2016 207* 65 - 99 mg/dL Final  . Comment 1 06/04/2016 Notify RN   Final  . Comment 2 06/04/2016 Document in Chart   Final  . Glucose-Capillary 06/04/2016 157* 65 - 99 mg/dL Final  . Comment 1 06/04/2016 Notify RN   Final  Admission on 05/11/2016,  Discharged on 05/12/2016  Component Date Value Ref Range Status  . Glucose-Capillary 05/11/2016 553* 65 - 99 mg/dL Final  . Comment 1 05/11/2016 Notify RN   Final  . Color, Urine 05/11/2016 YELLOW  YELLOW Final  . APPearance 05/11/2016 HAZY* CLEAR Final  . Specific Gravity, Urine 05/11/2016 1.015  1.005 - 1.030 Final  . pH 05/11/2016 5.0  5.0 - 8.0 Final  . Glucose, UA 05/11/2016 >=500* NEGATIVE mg/dL Final  . Hgb urine dipstick 05/11/2016 NEGATIVE  NEGATIVE Final  . Bilirubin Urine 05/11/2016 NEGATIVE  NEGATIVE Final  . Ketones, ur 05/11/2016 NEGATIVE  NEGATIVE mg/dL Final  . Protein, ur 05/11/2016 NEGATIVE  NEGATIVE mg/dL Final  . Nitrite 05/11/2016 NEGATIVE  NEGATIVE Final  . Leukocytes, UA 05/11/2016 NEGATIVE  NEGATIVE Final  . RBC / HPF 05/11/2016 0-5  0 - 5 RBC/hpf Final  . WBC, UA 05/11/2016 0-5  0 - 5 WBC/hpf Final  . Bacteria, UA 05/11/2016 NONE SEEN  NONE SEEN Final  . Squamous Epithelial / LPF 05/11/2016 0-5* NONE SEEN Final  . Mucous 05/11/2016 PRESENT   Final  . Granular Casts, UA 05/11/2016  PRESENT   Final  . Sodium 05/11/2016 124* 135 - 145 mmol/L Final  . Potassium 05/11/2016 4.0  3.5 - 5.1 mmol/L Final  . Chloride 05/11/2016 86* 101 - 111 mmol/L Final  . CO2 05/11/2016 27  22 - 32 mmol/L Final  . Glucose, Bld 05/11/2016 598* 65 - 99 mg/dL Final   Comment: CRITICAL RESULT CALLED TO, READ BACK BY AND VERIFIED WITH: CRUISE,JJ ON 05/11/16 AT 1445 BY LOY,C   . BUN 05/11/2016 35* 6 - 20 mg/dL Final  . Creatinine, Ser 05/11/2016 2.02* 0.61 - 1.24 mg/dL Final  . Calcium 05/11/2016 9.2  8.9 - 10.3 mg/dL Final  . Total Protein 05/11/2016 9.6* 6.5 - 8.1 g/dL Final  . Albumin 05/11/2016 3.3* 3.5 - 5.0 g/dL Final  . AST 05/11/2016 15  15 - 41 U/L Final  . ALT 05/11/2016 14* 17 - 63 U/L Final  . Alkaline Phosphatase 05/11/2016 81  38 - 126 U/L Final  . Total Bilirubin 05/11/2016 0.4  0.3 - 1.2 mg/dL Final  . GFR calc non Af Amer 05/11/2016 33* >60 mL/min Final  . GFR calc Af  Amer 05/11/2016 39* >60 mL/min Final   Comment: (NOTE) The eGFR has been calculated using the CKD EPI equation. This calculation has not been validated in all clinical situations. eGFR's persistently <60 mL/min signify possible Chronic Kidney Disease.   . Anion gap 05/11/2016 11  5 - 15 Final  . WBC 05/11/2016 15.7* 4.0 - 10.5 K/uL Final  . RBC 05/11/2016 3.90* 4.22 - 5.81 MIL/uL Final  . Hemoglobin 05/11/2016 9.9* 13.0 - 17.0 g/dL Final  . HCT 05/11/2016 31.7* 39.0 - 52.0 % Final  . MCV 05/11/2016 81.3  78.0 - 100.0 fL Final  . MCH 05/11/2016 25.4* 26.0 - 34.0 pg Final  . MCHC 05/11/2016 31.2  30.0 - 36.0 g/dL Final  . RDW 05/11/2016 16.5* 11.5 - 15.5 % Final  . Platelets 05/11/2016 497* 150 - 400 K/uL Final  . Neutrophils Relative % 05/11/2016 85  % Final  . Neutro Abs 05/11/2016 13.3* 1.7 - 7.7 K/uL Final  . Lymphocytes Relative 05/11/2016 11  % Final  . Lymphs Abs 05/11/2016 1.8  0.7 - 4.0 K/uL Final  . Monocytes Relative 05/11/2016 4  % Final  . Monocytes Absolute 05/11/2016 0.7  0.1 - 1.0 K/uL Final  . Eosinophils Relative 05/11/2016 0  % Final  . Eosinophils Absolute 05/11/2016 0.0  0.0 - 0.7 K/uL Final  . Basophils Relative 05/11/2016 0  % Final  . Basophils Absolute 05/11/2016 0.0  0.0 - 0.1 K/uL Final  . Glucose-Capillary 05/11/2016 461* 65 - 99 mg/dL Final  . Glucose-Capillary 05/11/2016 432* 65 - 99 mg/dL Final  . Comment 1 05/11/2016 Document in Chart   Final  Nursing Home on 04/24/2016  Component Date Value Ref Range Status  . Hemoglobin 04/19/2016 8.2* 13.5 - 17.5 g/dL Final  . HCT 04/19/2016 26* 41 - 53 % Final  . Platelets 04/19/2016 416* 150 - 399 K/L Final  . WBC 04/19/2016 10.9  10^3/mL Final    Dg Foot Complete Right  Result Date: 06/27/2016 CLINICAL DATA:  Valve postop appearance following amputation EXAM: RIGHT FOOT COMPLETE - 3+ VIEW COMPARISON:  06/10/16 FINDINGS: Transmetatarsal amputation is noted. A few small bony fragments are noted in the surgical  bed. No erosive changes are seen. No other focal abnormality is noted. IMPRESSION: Postsurgical changes as described. Electronically Signed   By: Inez Catalina M.D.   On: 06/27/2016 13:34  Dg Foot Complete Right  Result Date: 06/10/2016 CLINICAL DATA:  Lateral soft tissue wound draining purulent material. EXAM: RIGHT FOOT COMPLETE - 3+ VIEW COMPARISON:  05/31/2016 FINDINGS: No acute fracture or dislocation is noted no underlying bony destruction is seen. No gross soft tissue abnormality is noted. IMPRESSION: No acute abnormality seen. Electronically Signed   By: Inez Catalina M.D.   On: 06/10/2016 15:38     Assessment/Plan   ICD-9-CM ICD-10-CM   1. S/P transmetatarsal amputation of foot, right ([place]) - incision slightly dehisced but no secondary signs of infection V49.73 Z89.431   2. PVD (peripheral vascular disease) (HCC) 443.9 I73.9   3. Uncontrolled type II diabetes with peripheral autonomic neuropathy (HCC) - with hyperglycemia 250.62 E11.43    337.1 E11.65   4. Physical deconditioning 799.3 R53.81   5. Anemia of chronic disease 285.29 D63.8   6. Essential hypertension 401.9 I10    Increase Novolog to 20 units after meals  Wound care as ordered  Keep right LE elevated when seated  Check CBC  Cont other meds as ordered  F/u with vascular sx as scheduled  PT/OT/ST as ordered  GOAL: short term rehab then resume long term care. Communicated with pt and nursing.  Will follow  Harol Shabazz S. Perlie Gold  [facility] [address] [place], [place] [address] [phone] Cell (Monday-Friday 8 AM - 5 PM) [phone] After 5 PM and follow prompts

## 2016-07-05 ENCOUNTER — Ambulatory Visit: Payer: Medicaid Other | Admitting: Family

## 2016-07-08 ENCOUNTER — Emergency Department (HOSPITAL_COMMUNITY): Payer: Medicaid Other

## 2016-07-08 ENCOUNTER — Inpatient Hospital Stay (HOSPITAL_COMMUNITY)
Admission: EM | Admit: 2016-07-08 | Discharge: 2016-07-18 | DRG: 474 | Disposition: A | Discharge status: Skilled Nursing Facility | Payer: Medicaid Other | Attending: Internal Medicine | Admitting: Internal Medicine

## 2016-07-08 ENCOUNTER — Encounter (HOSPITAL_COMMUNITY): Payer: Self-pay

## 2016-07-08 DIAGNOSIS — I5032 Chronic diastolic (congestive) heart failure: Secondary | ICD-10-CM | POA: Diagnosis present

## 2016-07-08 DIAGNOSIS — E1169 Type 2 diabetes mellitus with other specified complication: Secondary | ICD-10-CM

## 2016-07-08 DIAGNOSIS — Z7902 Long term (current) use of antithrombotics/antiplatelets: Secondary | ICD-10-CM

## 2016-07-08 DIAGNOSIS — Z8673 Personal history of transient ischemic attack (TIA), and cerebral infarction without residual deficits: Secondary | ICD-10-CM

## 2016-07-08 DIAGNOSIS — T8130XA Disruption of wound, unspecified, initial encounter: Secondary | ICD-10-CM | POA: Diagnosis present

## 2016-07-08 DIAGNOSIS — E11649 Type 2 diabetes mellitus with hypoglycemia without coma: Secondary | ICD-10-CM | POA: Diagnosis present

## 2016-07-08 DIAGNOSIS — T402X5A Adverse effect of other opioids, initial encounter: Secondary | ICD-10-CM | POA: Diagnosis present

## 2016-07-08 DIAGNOSIS — E1142 Type 2 diabetes mellitus with diabetic polyneuropathy: Secondary | ICD-10-CM | POA: Diagnosis present

## 2016-07-08 DIAGNOSIS — B1920 Unspecified viral hepatitis C without hepatic coma: Secondary | ICD-10-CM | POA: Diagnosis present

## 2016-07-08 DIAGNOSIS — N183 Chronic kidney disease, stage 3 (moderate): Secondary | ICD-10-CM | POA: Diagnosis present

## 2016-07-08 DIAGNOSIS — L089 Local infection of the skin and subcutaneous tissue, unspecified: Secondary | ICD-10-CM | POA: Diagnosis not present

## 2016-07-08 DIAGNOSIS — Z9889 Other specified postprocedural states: Secondary | ICD-10-CM

## 2016-07-08 DIAGNOSIS — E1122 Type 2 diabetes mellitus with diabetic chronic kidney disease: Secondary | ICD-10-CM | POA: Diagnosis present

## 2016-07-08 DIAGNOSIS — D638 Anemia in other chronic diseases classified elsewhere: Secondary | ICD-10-CM | POA: Diagnosis not present

## 2016-07-08 DIAGNOSIS — T8149XA Infection following a procedure, other surgical site, initial encounter: Secondary | ICD-10-CM

## 2016-07-08 DIAGNOSIS — E785 Hyperlipidemia, unspecified: Secondary | ICD-10-CM | POA: Diagnosis present

## 2016-07-08 DIAGNOSIS — I130 Hypertensive heart and chronic kidney disease with heart failure and stage 1 through stage 4 chronic kidney disease, or unspecified chronic kidney disease: Secondary | ICD-10-CM | POA: Diagnosis present

## 2016-07-08 DIAGNOSIS — J690 Pneumonitis due to inhalation of food and vomit: Secondary | ICD-10-CM | POA: Diagnosis not present

## 2016-07-08 DIAGNOSIS — R338 Other retention of urine: Secondary | ICD-10-CM | POA: Diagnosis present

## 2016-07-08 DIAGNOSIS — M86371 Chronic multifocal osteomyelitis, right ankle and foot: Secondary | ICD-10-CM | POA: Diagnosis not present

## 2016-07-08 DIAGNOSIS — Z794 Long term (current) use of insulin: Secondary | ICD-10-CM

## 2016-07-08 DIAGNOSIS — Z89431 Acquired absence of right foot: Secondary | ICD-10-CM | POA: Diagnosis not present

## 2016-07-08 DIAGNOSIS — Y835 Amputation of limb(s) as the cause of abnormal reaction of the patient, or of later complication, without mention of misadventure at the time of the procedure: Secondary | ICD-10-CM | POA: Diagnosis present

## 2016-07-08 DIAGNOSIS — Z79899 Other long term (current) drug therapy: Secondary | ICD-10-CM

## 2016-07-08 DIAGNOSIS — K5903 Drug induced constipation: Secondary | ICD-10-CM | POA: Diagnosis present

## 2016-07-08 DIAGNOSIS — E1151 Type 2 diabetes mellitus with diabetic peripheral angiopathy without gangrene: Secondary | ICD-10-CM | POA: Diagnosis present

## 2016-07-08 DIAGNOSIS — T8753 Necrosis of amputation stump, right lower extremity: Secondary | ICD-10-CM | POA: Diagnosis present

## 2016-07-08 DIAGNOSIS — R0602 Shortness of breath: Secondary | ICD-10-CM

## 2016-07-08 DIAGNOSIS — T814XXA Infection following a procedure, initial encounter: Secondary | ICD-10-CM

## 2016-07-08 DIAGNOSIS — I998 Other disorder of circulatory system: Secondary | ICD-10-CM

## 2016-07-08 DIAGNOSIS — Z87891 Personal history of nicotine dependence: Secondary | ICD-10-CM

## 2016-07-08 DIAGNOSIS — D62 Acute posthemorrhagic anemia: Secondary | ICD-10-CM | POA: Diagnosis not present

## 2016-07-08 DIAGNOSIS — J45909 Unspecified asthma, uncomplicated: Secondary | ICD-10-CM | POA: Diagnosis present

## 2016-07-08 DIAGNOSIS — N401 Enlarged prostate with lower urinary tract symptoms: Secondary | ICD-10-CM | POA: Diagnosis present

## 2016-07-08 DIAGNOSIS — M869 Osteomyelitis, unspecified: Secondary | ICD-10-CM | POA: Diagnosis present

## 2016-07-08 DIAGNOSIS — I739 Peripheral vascular disease, unspecified: Secondary | ICD-10-CM | POA: Diagnosis not present

## 2016-07-08 DIAGNOSIS — Z7982 Long term (current) use of aspirin: Secondary | ICD-10-CM

## 2016-07-08 DIAGNOSIS — S88112A Complete traumatic amputation at level between knee and ankle, left lower leg, initial encounter: Secondary | ICD-10-CM

## 2016-07-08 DIAGNOSIS — Z89512 Acquired absence of left leg below knee: Secondary | ICD-10-CM

## 2016-07-08 DIAGNOSIS — E1121 Type 2 diabetes mellitus with diabetic nephropathy: Secondary | ICD-10-CM | POA: Diagnosis present

## 2016-07-08 DIAGNOSIS — T8131XA Disruption of external operation (surgical) wound, not elsewhere classified, initial encounter: Secondary | ICD-10-CM | POA: Diagnosis not present

## 2016-07-08 DIAGNOSIS — I2510 Atherosclerotic heart disease of native coronary artery without angina pectoris: Secondary | ICD-10-CM | POA: Diagnosis present

## 2016-07-08 DIAGNOSIS — IMO0001 Reserved for inherently not codable concepts without codable children: Secondary | ICD-10-CM

## 2016-07-08 DIAGNOSIS — I10 Essential (primary) hypertension: Secondary | ICD-10-CM | POA: Diagnosis present

## 2016-07-08 DIAGNOSIS — M79671 Pain in right foot: Secondary | ICD-10-CM | POA: Diagnosis present

## 2016-07-08 LAB — COMPREHENSIVE METABOLIC PANEL WITH GFR
ALT: 11 U/L — ABNORMAL LOW (ref 17–63)
AST: 16 U/L (ref 15–41)
Albumin: 3.1 g/dL — ABNORMAL LOW (ref 3.5–5.0)
Alkaline Phosphatase: 69 U/L (ref 38–126)
Anion gap: 8 (ref 5–15)
BUN: 12 mg/dL (ref 6–20)
CO2: 28 mmol/L (ref 22–32)
Calcium: 9.2 mg/dL (ref 8.9–10.3)
Chloride: 97 mmol/L — ABNORMAL LOW (ref 101–111)
Creatinine, Ser: 1.09 mg/dL (ref 0.61–1.24)
GFR calc Af Amer: >60 mL/min
GFR calc non Af Amer: >60 mL/min
Glucose, Bld: 74 mg/dL (ref 65–99)
Potassium: 4.2 mmol/L (ref 3.5–5.1)
Sodium: 133 mmol/L — ABNORMAL LOW (ref 135–145)
Total Bilirubin: 0.2 mg/dL — ABNORMAL LOW (ref 0.3–1.2)
Total Protein: 8.8 g/dL — ABNORMAL HIGH (ref 6.5–8.1)

## 2016-07-08 LAB — CBG MONITORING, ED
GLUCOSE-CAPILLARY: 56 mg/dL — AB (ref 65–99)
GLUCOSE-CAPILLARY: 87 mg/dL (ref 65–99)
Glucose-Capillary: 79 mg/dL (ref 65–99)
Glucose-Capillary: 73 mg/dL (ref 65–99)

## 2016-07-08 LAB — CBC WITH DIFFERENTIAL/PLATELET
BASOS ABS: 0 10*3/uL (ref 0.0–0.1)
BASOS PCT: 0 %
Eosinophils Absolute: 0.3 10*3/uL (ref 0.0–0.7)
Eosinophils Relative: 3 %
HEMATOCRIT: 24.8 % — AB (ref 39.0–52.0)
HEMOGLOBIN: 7.4 g/dL — AB (ref 13.0–17.0)
LYMPHS PCT: 27 %
Lymphs Abs: 2.6 10*3/uL (ref 0.7–4.0)
MCH: 25.6 pg — ABNORMAL LOW (ref 26.0–34.0)
MCHC: 29.8 g/dL — ABNORMAL LOW (ref 30.0–36.0)
MCV: 85.8 fL (ref 78.0–100.0)
Monocytes Absolute: 0.6 10*3/uL (ref 0.1–1.0)
Monocytes Relative: 6 %
NEUTROS ABS: 6.3 10*3/uL (ref 1.7–7.7)
NEUTROS PCT: 64 %
Platelets: 475 10*3/uL — ABNORMAL HIGH (ref 150–400)
RBC: 2.89 MIL/uL — ABNORMAL LOW (ref 4.22–5.81)
RDW: 16.3 % — ABNORMAL HIGH (ref 11.5–15.5)
WBC: 9.8 10*3/uL (ref 4.0–10.5)

## 2016-07-08 LAB — SEDIMENTATION RATE: Sed Rate: >140 mm/h — ABNORMAL HIGH (ref 0–16)

## 2016-07-08 LAB — GLUCOSE, CAPILLARY: Glucose-Capillary: 202 mg/dL — ABNORMAL HIGH (ref 65–99)

## 2016-07-08 LAB — I-STAT CG4 LACTIC ACID, ED: Lactic Acid, Venous: 1.31 mmol/L (ref 0.5–1.9)

## 2016-07-08 LAB — C-REACTIVE PROTEIN: CRP: 2.2 mg/dL — AB (ref <1.0)

## 2016-07-08 LAB — PREALBUMIN: Prealbumin: 15.9 mg/dL — ABNORMAL LOW (ref 18–38)

## 2016-07-08 MED ORDER — INSULIN ASPART 100 UNIT/ML ~~LOC~~ SOLN
0.0000 [IU] | Freq: Three times a day (TID) | SUBCUTANEOUS | Status: DC
  Administered 2016-07-09: 3 [IU] via SUBCUTANEOUS
  Administered 2016-07-09: 8 [IU] via SUBCUTANEOUS
  Administered 2016-07-10 – 2016-07-11 (×3): 2 [IU] via SUBCUTANEOUS
  Administered 2016-07-12: 5 [IU] via SUBCUTANEOUS
  Administered 2016-07-13 – 2016-07-14 (×5): 2 [IU] via SUBCUTANEOUS
  Administered 2016-07-16 (×3): 3 [IU] via SUBCUTANEOUS
  Administered 2016-07-17 (×2): 2 [IU] via SUBCUTANEOUS
  Administered 2016-07-17 – 2016-07-18 (×3): 3 [IU] via SUBCUTANEOUS

## 2016-07-08 MED ORDER — POLYETHYLENE GLYCOL 3350 17 G PO PACK
17.0000 g | PACK | Freq: Every day | ORAL | Status: DC
  Administered 2016-07-09 – 2016-07-13 (×3): 17 g via ORAL
  Filled 2016-07-08 (×4): qty 1

## 2016-07-08 MED ORDER — HYDROMORPHONE HCL 1 MG/ML IJ SOLN
1.0000 mg | Freq: Once | INTRAMUSCULAR | Status: AC
  Administered 2016-07-08: 1 mg via INTRAVENOUS
  Filled 2016-07-08: qty 1

## 2016-07-08 MED ORDER — OXYCODONE HCL 5 MG PO TABS
10.0000 mg | ORAL_TABLET | ORAL | Status: DC | PRN
  Administered 2016-07-09 – 2016-07-11 (×9): 15 mg via ORAL
  Administered 2016-07-12: 10 mg via ORAL
  Administered 2016-07-12 (×2): 15 mg via ORAL
  Administered 2016-07-12: 5 mg via ORAL
  Administered 2016-07-14 – 2016-07-15 (×6): 15 mg via ORAL
  Administered 2016-07-15: 10 mg via ORAL
  Administered 2016-07-15: 15 mg via ORAL
  Filled 2016-07-08: qty 2
  Filled 2016-07-08 (×6): qty 3
  Filled 2016-07-08: qty 2
  Filled 2016-07-08 (×2): qty 3
  Filled 2016-07-08 (×2): qty 2
  Filled 2016-07-08 (×7): qty 3
  Filled 2016-07-08: qty 2
  Filled 2016-07-08 (×2): qty 3

## 2016-07-08 MED ORDER — DEXTROSE 50 % IV SOLN
INTRAVENOUS | Status: AC
  Filled 2016-07-08: qty 50

## 2016-07-08 MED ORDER — LORAZEPAM 2 MG/ML IJ SOLN
2.0000 mg | Freq: Once | INTRAMUSCULAR | Status: AC
  Administered 2016-07-09: 2 mg via INTRAVENOUS
  Filled 2016-07-08: qty 1

## 2016-07-08 MED ORDER — ONDANSETRON HCL 4 MG/2ML IJ SOLN
4.0000 mg | Freq: Four times a day (QID) | INTRAMUSCULAR | Status: DC | PRN
  Administered 2016-07-17: 4 mg via INTRAVENOUS
  Filled 2016-07-08: qty 2

## 2016-07-08 MED ORDER — GABAPENTIN 600 MG PO TABS: 600.0000 mg | ORAL_TABLET | ORAL | Status: DC

## 2016-07-08 MED ORDER — OXYCODONE HCL 5 MG PO TABS
15.0000 mg | ORAL_TABLET | Freq: Once | ORAL | Status: AC
  Administered 2016-07-08: 15 mg via ORAL
  Filled 2016-07-08: qty 3

## 2016-07-08 MED ORDER — ALLOPURINOL 100 MG PO TABS
200.0000 mg | ORAL_TABLET | Freq: Every day | ORAL | Status: DC
  Administered 2016-07-09 – 2016-07-17 (×9): 200 mg via ORAL
  Filled 2016-07-08 (×9): qty 2

## 2016-07-08 MED ORDER — GABAPENTIN 600 MG PO TABS
1200.0000 mg | ORAL_TABLET | Freq: Every day | ORAL | Status: DC
  Administered 2016-07-09 – 2016-07-13 (×5): 1200 mg via ORAL
  Filled 2016-07-08 (×8): qty 2

## 2016-07-08 MED ORDER — PIPERACILLIN-TAZOBACTAM 3.375 G IVPB
3.3750 g | Freq: Three times a day (TID) | INTRAVENOUS | Status: DC
  Administered 2016-07-09 – 2016-07-13 (×12): 3.375 g via INTRAVENOUS
  Filled 2016-07-08 (×15): qty 50

## 2016-07-08 MED ORDER — MORPHINE SULFATE ER 15 MG PO TBCR
15.0000 mg | EXTENDED_RELEASE_TABLET | Freq: Two times a day (BID) | ORAL | Status: DC
  Administered 2016-07-09 – 2016-07-15 (×11): 15 mg via ORAL
  Filled 2016-07-08 (×15): qty 1

## 2016-07-08 MED ORDER — DEXTROSE 50 % IV SOLN: 1.0000 | Freq: Once | INTRAVENOUS | Status: DC

## 2016-07-08 MED ORDER — ASPIRIN 325 MG PO TABS
325.0000 mg | ORAL_TABLET | Freq: Every day | ORAL | Status: DC
  Administered 2016-07-09 – 2016-07-18 (×10): 325 mg via ORAL
  Filled 2016-07-08 (×10): qty 1

## 2016-07-08 MED ORDER — ONDANSETRON HCL 4 MG PO TABS: 4.0000 mg | ORAL_TABLET | Freq: Four times a day (QID) | ORAL | Status: DC | PRN

## 2016-07-08 MED ORDER — PIPERACILLIN-TAZOBACTAM 3.375 G IVPB 30 MIN
3.3750 g | Freq: Once | INTRAVENOUS | Status: AC
  Administered 2016-07-08: 3.375 g via INTRAVENOUS
  Filled 2016-07-08: qty 50

## 2016-07-08 MED ORDER — LISINOPRIL 5 MG PO TABS
5.0000 mg | ORAL_TABLET | Freq: Every day | ORAL | Status: DC
  Administered 2016-07-09 – 2016-07-18 (×10): 5 mg via ORAL
  Filled 2016-07-08 (×10): qty 1

## 2016-07-08 MED ORDER — ACETAMINOPHEN 650 MG RE SUPP
650.0000 mg | Freq: Four times a day (QID) | RECTAL | Status: DC | PRN
  Filled 2016-07-08: qty 1

## 2016-07-08 MED ORDER — GABAPENTIN 600 MG PO TABS
600.0000 mg | ORAL_TABLET | Freq: Two times a day (BID) | ORAL | Status: DC
  Administered 2016-07-09 – 2016-07-15 (×12): 600 mg via ORAL
  Filled 2016-07-08 (×13): qty 1

## 2016-07-08 MED ORDER — CARVEDILOL 3.125 MG PO TABS
3.1250 mg | ORAL_TABLET | Freq: Two times a day (BID) | ORAL | Status: DC
  Administered 2016-07-09 – 2016-07-18 (×19): 3.125 mg via ORAL
  Filled 2016-07-08 (×19): qty 1

## 2016-07-08 MED ORDER — ACETAMINOPHEN 325 MG PO TABS
650.0000 mg | ORAL_TABLET | Freq: Four times a day (QID) | ORAL | Status: DC | PRN
  Administered 2016-07-12 – 2016-07-18 (×5): 650 mg via ORAL
  Filled 2016-07-08 (×5): qty 2

## 2016-07-08 MED ORDER — SENNOSIDES-DOCUSATE SODIUM 8.6-50 MG PO TABS
1.0000 | ORAL_TABLET | Freq: Two times a day (BID) | ORAL | Status: DC
  Administered 2016-07-09 – 2016-07-16 (×15): 1 via ORAL
  Filled 2016-07-08 (×15): qty 1

## 2016-07-08 MED ORDER — VANCOMYCIN HCL IN DEXTROSE 1-5 GM/200ML-% IV SOLN
1000.0000 mg | Freq: Two times a day (BID) | INTRAVENOUS | Status: DC
  Administered 2016-07-09 – 2016-07-11 (×6): 1000 mg via INTRAVENOUS
  Filled 2016-07-08 (×8): qty 200

## 2016-07-08 MED ORDER — VANCOMYCIN HCL 10 G IV SOLR
1750.0000 mg | Freq: Once | INTRAVENOUS | Status: AC
  Administered 2016-07-08: 1750 mg via INTRAVENOUS
  Filled 2016-07-08: qty 1750

## 2016-07-08 MED ORDER — DEXTROSE 50 % IV SOLN
25.0000 mL | Freq: Once | INTRAVENOUS | Status: AC
  Administered 2016-07-08: 25 mL via INTRAVENOUS

## 2016-07-08 MED ORDER — SIMVASTATIN 20 MG PO TABS
20.0000 mg | ORAL_TABLET | Freq: Every day | ORAL | Status: DC
  Administered 2016-07-09 – 2016-07-18 (×10): 20 mg via ORAL
  Filled 2016-07-08 (×10): qty 1

## 2016-07-08 MED ORDER — MAGNESIUM HYDROXIDE 400 MG/5ML PO SUSP
30.0000 mL | Freq: Every day | ORAL | Status: DC | PRN
  Administered 2016-07-14: 30 mL via ORAL
  Filled 2016-07-08: qty 30

## 2016-07-08 MED ORDER — TAMSULOSIN HCL 0.4 MG PO CAPS
0.4000 mg | ORAL_CAPSULE | Freq: Every day | ORAL | Status: DC
  Administered 2016-07-09 – 2016-07-18 (×10): 0.4 mg via ORAL
  Filled 2016-07-08 (×10): qty 1

## 2016-07-08 MED ORDER — INSULIN GLARGINE 100 UNIT/ML ~~LOC~~ SOLN
25.0000 [IU] | Freq: Every day | SUBCUTANEOUS | Status: DC
  Administered 2016-07-09 – 2016-07-17 (×9): 25 [IU] via SUBCUTANEOUS
  Filled 2016-07-08 (×11): qty 0.25

## 2016-07-08 MED ORDER — FERROUS SULFATE 325 (65 FE) MG PO TABS
325.0000 mg | ORAL_TABLET | Freq: Three times a day (TID) | ORAL | Status: DC
  Administered 2016-07-09 – 2016-07-18 (×27): 325 mg via ORAL
  Filled 2016-07-08 (×26): qty 1

## 2016-07-08 MED ORDER — METHOCARBAMOL 500 MG PO TABS
500.0000 mg | ORAL_TABLET | Freq: Four times a day (QID) | ORAL | Status: DC | PRN
  Administered 2016-07-11 – 2016-07-18 (×4): 500 mg via ORAL
  Filled 2016-07-08 (×4): qty 1

## 2016-07-08 MED ORDER — ENOXAPARIN SODIUM 40 MG/0.4ML ~~LOC~~ SOLN
40.0000 mg | SUBCUTANEOUS | Status: DC
  Administered 2016-07-09 – 2016-07-10 (×3): 40 mg via SUBCUTANEOUS
  Filled 2016-07-08 (×3): qty 0.4

## 2016-07-08 NOTE — ED Notes (Signed)
Patient transported to MRI 

## 2016-07-08 NOTE — Progress Notes (Signed)
Pharmacy Antibiotic Note  Cory Weber is a 66 y.o. male admitted on 07/08/2016 with drainage from wound on foot, pt is s/p toe amputation a few weeks ago. Starting broad spectrum abx for possible osteomyelitis. LA 1.3, SCr 1, WBC wnl, afebrile.     Plan: -Vancomycin 1750 mg IV x1 then 750 mg IV q12h -Zosyn 3.375 g IV q8h -Monitor renal fx, cultures, VT as needed   Height: 6\' 1"  (185.4 cm) Weight: 190 lb (86.2 kg) IBW/kg (Calculated) : 79.9  Temp (24hrs), Avg:98 F (36.7 C), Min:98 F (36.7 C), Max:98 F (36.7 C)   Recent Labs Lab 07/08/16 1505 07/08/16 1521  WBC 9.8  --   CREATININE 1.09  --   LATICACIDVEN  --  1.31    Estimated Creatinine Clearance: 78.4 mL/min (by C-G formula based on SCr of 1.09 mg/dL).    No Known Allergies  Antimicrobials this admission: 5/28 vancomycin > 5/28 zosyn >   Dose adjustments this admission: N/A   Microbiology results: N/A    Harvel Quale 07/08/2016 6:54 PM

## 2016-07-08 NOTE — Progress Notes (Signed)
MRI ATTEMPTED, SPOKE WITH PT MULTIPLE TIME ABOUT NOT MOVING AND PT CONTINUED TO MOVE, RN NOTIFIED, PT SENT BACK TO ER

## 2016-07-08 NOTE — H&P (Signed)
History and Physical    Cory Weber LOV:[id number] DOB: [birth year] DOA: 07/08/2016  PCP: Iona Beard, MD; [facility] at the nursing home Consultants:  Donzetta Matters - vascular Patient coming from: [facility]; NOK: sister, [phone], [phone]  Chief Complaint: foot infection  HPI: Cory Weber is a 66 y.o. male with medical history significant of asthma, BPH, CAD, history of CVA, hepatitis C, hyperlipidemia, hypertension, PVD with left BKA, diabetes, and recent hospitalization 05/31/16-06/04/16 for treatment of ischemia of right foot with gangrene, status post arthrectomy and balloon angioplasty of right SFA with stent, discharged on aspirin and Plavix, who was admitted 06/10/16 for severe right foot pain unrelieved by oral opiates, was subsequently evaluated by vascular surgery and ultimately underwent a right transmetatarsal amputation on 06/13/16.  He was sent to [facility] on 5/7 and it hasn't appeared to get well since discharge.  Foot is "self-explanatory."  It isn't really bothering him.  "They have been trying to mask the injuiry with pain pills and that ain't the ticket".  The color was off, odor present.    No fevers.  Feels good, just hungry.  He reports that his sugars have been high the last few days but were good otherwise since discharge.  BPs have not been as well controlled.   ED Course:   Concern for wound infection.  ESR and CRP are elevated.  Xray with area concerning for osteomyelitis over the 1st metatarsal,  Unable to tolerate MRI.  Given Vanc/Zosyn.  IV dextrose due to hypoglycemia.    Review of Systems: As per HPI; otherwise review of systems reviewed and negative.   Ambulatory Status:  Wheelchair bound, non-weight bearing  Past Medical History:  Diagnosis Date  . Anemia   . Asthma   . BPH (benign prostatic hyperplasia)   . Cellulitis and abscess of foot 07/2015  . Chronic kidney disease    CKD stage 3  . CVA (cerebral vascular accident) ([place]) 2015   denies  residual on 08/15/2015  . Hepatitis C    states he was diagnosed in 2007 or 2007 while living in [place], [place]  . Hyperlipidemia   . Hypertension   . Ischemia of foot 05/31/2016  . Peripheral neuropathy   . Pneumonia X 1  . PVD (peripheral vascular disease) ([place])   . Type II diabetes mellitus ([place])   . Wet gangrene ([place]) 06/10/2016    Past Surgical History:  Procedure Laterality Date  . ABDOMINAL AORTOGRAM N/A 06/03/2016   Procedure: Abdominal Aortogram;  Surgeon: Waynetta Sandy, MD;  Location: [place] [facility];  Service: Cardiovascular;  Laterality: N/A;  . AMPUTATION Left 08/16/2015   Procedure: LEFT BELOW THE KNEE AMPUTATION ;  Surgeon: Serafina Mitchell, MD;  Location: [place];  Service: Vascular;  Laterality: Left;  . BACK SURGERY    . BELOW KNEE LEG AMPUTATION Left 08/16/2015  . BIOPSY N/A 09/03/2012   Procedure: BIOPSY;  Surgeon: Daneil Dolin, MD;  Location: [facility];  Service: Endoscopy;  Laterality: N/A;  gastric and gastric mucosa  . BIOPSY N/A 12/03/2012   Procedure: BIOPSY;  Surgeon: Daneil Dolin, MD;  Location: [facility];  Service: Endoscopy;  Laterality: N/A;  . COLONOSCOPY WITH PROPOFOL N/A 09/03/2012   TDD:UKGURKY polyp-removed as outlined above. Prominent internal hemorrhoids. Tubular adenoma  . ESOPHAGOGASTRODUODENOSCOPY (EGD) WITH PROPOFOL N/A 09/03/2012   HCW:CBJSEG hernia. Gastric diverticulum. Gastric ulcers with associated erosions. Duodenal erosions. Status post gastric biopsy. H.PYLORI gastritis   . ESOPHAGOGASTRODUODENOSCOPY (EGD) WITH PROPOFOL N/A 12/03/2012  Dr. Gala Romney: gastric diverticulum, gastric erosions and scar. Previously noted gastric ulcer completed healed. Biopsy without H.pylori.   . ESOPHAGOGASTRODUODENOSCOPY (EGD) WITH PROPOFOL N/A 01/23/2016   Procedure: ESOPHAGOGASTRODUODENOSCOPY (EGD) WITH PROPOFOL;  Surgeon: Doran Stabler, MD;  Location: [facility];  Service: Gastroenterology;  Laterality: N/A;  . LIVER BIOPSY  2005   Done in  [place], [place]. Chronic hepatitis with mild periportal inflammation, lobular unicellular necrosis and portal fibrosis. Grade 2, stage 1-2.  . LOWER EXTREMITY ANGIOGRAPHY Right 06/03/2016   Procedure: Lower Extremity Angiography;  Surgeon: Waynetta Sandy, MD;  Location: [place] [facility];  Service: Cardiovascular;  Laterality: Right;  . MAXIMUM ACCESS (MAS)POSTERIOR LUMBAR INTERBODY FUSION (PLIF) 1 LEVEL N/A 11/25/2013   Procedure: FOR MAXIMUM ACCESS (MAS) POSTERIOR LUMBAR INTERBODY FUSION (PLIF) 1 LEVEL;  Surgeon: Eustace Moore, MD;  Location: [place] [facility];  Service: Neurosurgery;  Laterality: N/A;  FOR MAXIMUM ACCESS (MAS) POSTERIOR LUMBAR INTERBODY FUSION (PLIF) 1 LEVEL LUMBAR 3-4  . PERIPHERAL VASCULAR ATHERECTOMY Right 06/03/2016   Procedure: Peripheral Vascular Atherectomy;  Surgeon: Waynetta Sandy, MD;  Location: [place] [facility];  Service: Cardiovascular;  Laterality: Right;  SFA WITH STENT  . PERIPHERAL VASCULAR CATHETERIZATION Left 08/01/2015   Procedure: Lower Extremity Angiography;  Surgeon: Serafina Mitchell, MD;  Location: [place] [facility];  Service: Cardiovascular;  Laterality: Left;  . PERIPHERAL VASCULAR CATHETERIZATION N/A 08/01/2015   Procedure: Abdominal Aortogram;  Surgeon: Serafina Mitchell, MD;  Location: [place] [facility];  Service: Cardiovascular;  Laterality: N/A;  . PERIPHERAL VASCULAR CATHETERIZATION N/A 08/08/2015   Procedure: Abdominal Aortogram w/Lower Extremity;  Surgeon: Serafina Mitchell, MD;  Location: [place] [facility];  Service: Cardiovascular;  Laterality: N/A;  . PERIPHERAL VASCULAR CATHETERIZATION Left 08/08/2015   Procedure: Peripheral Vascular Balloon Angioplasty;  Surgeon: Serafina Mitchell, MD;  Location: [place] [facility];  Service: Cardiovascular;  Laterality: Left;  left popiteal artery, left peronealtrunk, left post tibial  . POLYPECTOMY N/A 09/03/2012   Procedure: POLYPECTOMY;  Surgeon: Daneil Dolin, MD;  [facility]: [facility];  Service:  Endoscopy;  Laterality: N/A;  cecal polyp  . TONSILLECTOMY    . TRANSMETATARSAL AMPUTATION Right 06/13/2016   Procedure: RIGHT TRANSMETATARSAL AMPUTATION;  Surgeon: Waynetta Sandy, MD;  [facility];  Service: Vascular;  Laterality: Right;    Social History   Social History  . Marital status: Divorced    Spouse name: N/A  . Number of children: 1  . Years of education: N/A   Occupational History  . disabled    Social History Main Topics  . Smoking status: Former Smoker    Packs/day: 1.00    Years: 47.00    Types: Cigarettes    Quit date: 07/13/2015  . Smokeless tobacco: Never Used  . Alcohol use No     Comment:  none 08/15/2015 "stopped drinking 3-4 years ago"  . Drug use: No     Comment: 08/15/2015 "last used cocaine 3-4 months ago"  . Sexual activity: No   Other Topics Concern  . Not on file   Social History Narrative  . No narrative on file    No Known Allergies  Family History  Problem Relation Age of Onset  . Breast cancer Mother   . Heart failure Mother   . Diabetes Father   . Hypertension Father   . Heart disease Father   . Hyperlipidemia Father   . Diabetes Sister   . Hypertension Sister   . Breast  cancer Sister   . Colon cancer Neg Hx   . Liver disease Neg Hx     Prior to Admission medications   Medication Sig Start Date End Date Taking? Authorizing Provider  allopurinol (ZYLOPRIM) 100 MG tablet Take 200 mg by mouth daily after supper.     [provider]  aspirin 325 MG tablet Take 325 mg by mouth daily.    [provider]  carvedilol (COREG) 3.125 MG tablet Take 1 tablet (3.125 mg total) by mouth 2 (two) times daily with a meal. 08/10/15   Mikhail, Velta Addison, DO  cephALEXin (KEFLEX) 500 MG capsule Take 1 capsule (500 mg total) by mouth 4 (four) times daily. 06/27/16   Charlann Lange, PA-C  clopidogrel (PLAVIX) 75 MG tablet Take 1 tablet (75 mg total) by mouth daily with breakfast. 06/05/16   Dhungel, Nishant, MD  ferrous sulfate  325 (65 FE) MG tablet Take 325 mg by mouth 3 (three) times daily with meals.    [provider]  gabapentin (NEURONTIN) 600 MG tablet Take 600-1,200 mg by mouth See admin instructions. Take 1 tablet (600 mg) by mouth twice daily - morning and afternoon and take 2 tablets (1200 mg) at bedtime    [provider]  insulin aspart (NOVOLOG) 100 UNIT/ML injection Inject 18 Units into the skin 3 (three) times daily before meals.     [provider]  insulin glargine (LANTUS) 100 UNIT/ML injection Inject 0.5 mLs (50 Units total) into the skin at bedtime. 06/17/16   Rama, Venetia Maxon, MD  lisinopril (PRINIVIL,ZESTRIL) 5 MG tablet Take 5 mg by mouth daily.     [provider]  magnesium hydroxide (MILK OF MAGNESIA) 400 MG/5ML suspension Take 30 mLs by mouth daily as needed for mild constipation.    [provider]  metFORMIN (GLUCOPHAGE) 500 MG tablet Take 500 mg by mouth daily with breakfast.    [provider]  methocarbamol (ROBAXIN) 500 MG tablet Take 500 mg by mouth every 6 (six) hours as needed for muscle spasms.     [provider]  morphine (MS CONTIN) 15 MG 12 hr tablet Take 1 tablet (15 mg total) by mouth every 12 (twelve) hours. 06/19/16   Estill Dooms, MD  Oxycodone HCl 10 MG TABS Take 1-1.5 tablets (10-15 mg total) by mouth every 4 (four) hours as needed. 06/19/16   Estill Dooms, MD  polyethylene glycol [facility] / Floria Raveling) packet Take 17 g by mouth daily. 06/05/16   Dhungel, Nishant, MD  senna-docusate (SENOKOT-S) 8.6-50 MG tablet Take 1 tablet by mouth 2 (two) times daily. 06/17/16   Rama, Venetia Maxon, MD  simvastatin (ZOCOR) 20 MG tablet Take 20 mg by mouth daily.     [provider]  tamsulosin (FLOMAX) 0.4 MG CAPS capsule Take 1 capsule (0.4 mg total) by mouth daily. 08/10/15   Mikhail, Velta Addison, DO  torsemide (DEMADEX) 20 MG tablet Take 20 mg by mouth daily.     [provider]    Physical Exam: Vitals:   07/08/16  1830 07/08/16 1945 07/08/16 2015 07/08/16 2030  BP: 139/83 105/73 138/83   Pulse: 90 (!) 102  (!) 104  Resp: _0 Temp:      TempSrc:      SpO2: 99% 100%  99%  Weight:      Height:         General:  Appears calm and comfortable and is NAD Eyes:  PERRL, EOMI, normal lids, iris  ENT:  grossly normal hearing, lips & tongue, mmm Neck:  no LAD, masses or thyromegaly Cardiovascular:  RRR, no m/r/g. No LE edema.  Respiratory:  CTA bilaterally, no w/r/r. Normal respiratory effort. Abdomen:  soft, ntnd, NABS Skin:  no rash or induration seen on limited exam other than foot as described below Musculoskeletal:  S/p L BKA.  R foot s/p transmetatarsal amputation with dehiscence of wound and foul-smelling, purulent discharge across the entire distal aspect of the foot.  There is hyperpigmentation of the foot to the ankle. Psychiatric:  grossly normal mood and affect, speech fluent and appropriate, AOx3 Neurologic:  CN 2-12 grossly intact, moves all extremities in coordinated fashion, sensation intact  Labs on Admission: I have personally reviewed following labs and imaging studies  CBC:  Recent Labs Lab 07/08/16 1505  WBC 9.8  NEUTROABS 6.3  HGB 7.4*  HCT 24.8*  MCV 85.8  PLT 323*   Basic Metabolic Panel:  Recent Labs Lab 07/08/16 1505  NA 133*  K 4.2  CL 97*  CO2 28  GLUCOSE 74  BUN 12  CREATININE 1.09  CALCIUM 9.2   GFR: Estimated Creatinine Clearance: 78.4 mL/min (by C-G formula based on SCr of 1.09 mg/dL). Liver Function Tests:  Recent Labs Lab 07/08/16 1505  AST 16  ALT 11*  ALKPHOS 69  BILITOT 0.2*  PROT 8.8*  ALBUMIN 3.1*   No results for input(s): LIPASE, AMYLASE in the last 168 hours. No results for input(s): AMMONIA in the last 168 hours. Coagulation Profile: No results for input(s): INR, PROTIME in the last 168 hours. Cardiac Enzymes: No results for input(s): CKTOTAL, CKMB, CKMBINDEX, TROPONINI in the last 168 hours. BNP (last 3 results) No  results for input(s): PROBNP in the last 8760 hours. HbA1C: No results for input(s): HGBA1C in the last 72 hours. CBG:  Recent Labs Lab 07/08/16 1430 07/08/16 1715 07/08/16 1818 07/08/16 1940  GLUCAP 79 56* 87 73   Lipid Profile: No results for input(s): CHOL, HDL, LDLCALC, TRIG, CHOLHDL, LDLDIRECT in the last 72 hours. Thyroid Function Tests: No results for input(s): TSH, T4TOTAL, FREET4, T3FREE, THYROIDAB in the last 72 hours. Anemia Panel: No results for input(s): VITAMINB12, FOLATE, FERRITIN, TIBC, IRON, RETICCTPCT in the last 72 hours. Urine analysis:    Component Value Date/Time   COLORURINE YELLOW 06/01/2016 1144   APPEARANCEUR HAZY (A) 06/01/2016 1144   LABSPEC 1.016 06/01/2016 1144   PHURINE 5.0 06/01/2016 1144   GLUCOSEU 50 (A) 06/01/2016 1144   HGBUR NEGATIVE 06/01/2016 1144   BILIRUBINUR NEGATIVE 06/01/2016 1144   KETONESUR NEGATIVE 06/01/2016 1144   PROTEINUR NEGATIVE 06/01/2016 1144   UROBILINOGEN 1.0 11/29/2013 1705   NITRITE NEGATIVE 06/01/2016 1144   LEUKOCYTESUR NEGATIVE 06/01/2016 1144    Creatinine Clearance: Estimated Creatinine Clearance: 78.4 mL/min (by C-G formula based on SCr of 1.09 mg/dL).  Sepsis Labs: _0 (procalcitonin:4,lacticidven:4) )No results found for this or any previous visit (from the past 240 hour(s)).   Radiological Exams on Admission: Dg Foot Complete Right  Result Date: 07/08/2016 CLINICAL DATA:  Right foot infection. EXAM: RIGHT FOOT COMPLETE - 3+ VIEW COMPARISON:  06/27/2016. FINDINGS: Postsurgical changes of right forefoot amputation at the level of the proximal metatarsal bones identified. There is a focal area of bone demineralization involving the distal tip of the remaining first metatarsal bone which is new from previous exam and may reflect underlying osteomyelitis. Soft tissue swelling overlying the stump is identified. IMPRESSION: 1. Postop change from right forefoot amputation. 2. Focal area of new  bone  demineralization involving the tip of the remaining first metatarsal bone which may represent superimposed osteomyelitis. Electronically Signed   By: Kerby Moors M.D.   On: 07/08/2016 15:28    EKG: Not done  Assessment/Plan Principal Problem:   Osteomyelitis of foot ([place]) Active Problems:   Essential hypertension   Anemia of chronic disease   PVD (peripheral vascular disease) ([facility])   Amputation of left lower extremity below knee ([facility])   Dyslipidemia associated with type 2 diabetes mellitus ([facility])   Ischemia of foot   Chronic diastolic CHF (congestive heart failure) ([facility])   Constipation due to opioid therapy   S/P transmetatarsal amputation of foot, right ([place])   Pertinent labs: Glucose 79, 74, 56, 87, 73 CRP 2.2 ESR >140 Albumin 3.1 Lactate 1.31 Hgb 7.4, stable Platelets 475  Osteomyelitis of right foot with associated PVD/ischemia of the foot and h/o recent R transmetatarsal amputation as well as more remote L BKA -This appears to be a deep infection and is concerning for osteomyelitis -This process has been ongoing since his last hospitalization -I am concerned that he may have reocclusion following his prior angioplasty, as he appears to have ischemia to the foot  -Will admit, Med Surg -Antibiotics with  Zosyn and Vancomycin per diabetic foot ulcer order set -With concerning findings on X-ray, will need MRI to further assess for osteomyelitis; he did not tolerate this well but we can try again with further sedation. If unsuccessful, he may require general anesthesia for this procedure. -Needs consult by vascular surgery again tomorrow; I spoke with Dr. Donnetta Hutching by telephone tonight and they are happy to consult on the patient in the AM -Diabetic foot infection order set utilized, including orders for CM, SW, DM coordinator, wound care, and nutrition consult. -Goal would be for glucose <150 to facilitate wound healing. -Patient should be on bed rest, non-weight  bearing. -Excellent BP control is needed  -ABIs ordered again -Continue home narcotic/pain medications (Neurontin, MS Contin, Oxycodone, Robacin) -Continue ASA but hold Plavix for now in anticipation of need for surgery.  HTN -Continue Coreg and lisinopril. -Hold Demadex. -Reasonably controlled in the ER.  Constipation -Bowel regimen in place.   -Continue Miralax, Senokot, MOM  Anemia -Given 2 unit of PRBCs during last hospitalization.  -Hgb is currently stable at 7.4. -He may need additional blood assuming he requires further surgery. Continue iron supplementation for now.   DM -Hypoglycemia while in the ER requiring dextrose. -Will cut Lantus in half to 25 units daily and closely monitor. -Cover with moderate scale SSI for now. -Recent A1c was 9.0 on 4/21 so will not repeat.  HLD -Continue Zocor  CHF -Echo 02/15/16 with preserved EF and grade 2 diastolic dysfunction. -Hold Demadex for now.  DVT prophylaxis: SCDs for now Code Status:  Full - confirmed with patient/family Family Communication: Sisters and an additional family member present throughout evaluation  Disposition Plan:  Back to SNF once clinically improved Consults called: Vascular surgery; CM, SW, DM coordinator, wound care, and nutrition Admission status: Admit - It is my clinical opinion that admission to INPATIENT is reasonable and necessary because this patient will require at least 2 midnights in the hospital to treat this condition based on the medical complexity of the problems presented.  Given the aforementioned information, the predictability of an adverse outcome is felt to be significant.    Karmen Bongo MD [facility]  If 7PM-7AM, please contact night-coverage [email or web address] Password [email or web address]  07/08/2016, 10:14 PM

## 2016-07-08 NOTE — ED Notes (Signed)
Patient transported to X-ray 

## 2016-07-08 NOTE — ED Provider Notes (Signed)
[facility] DEPT Provider Note   CSN: [id number] Arrival date & time: 07/08/16  1415     History   Chief Complaint Chief Complaint  Patient presents with  . Wound Infection    HPI Cory Weber is a 66 y.o. male who presents for wound check of right foot wound. PMH significant for severe PAD s/p L BKA, Hep C, Type 2 DM, CAD, hx of TIA. He had a right transmetatarsal amputation on 5/3 by Dr. Donzetta Matters. He has been living at [facility] and completed a course of Keflex on 5/24. He has been having daily wound care at the SNF. Over the past several weeks he feels like the wound has been healing poorly. He presented again to the ED on 5/17 for evaluation of the wound and was discharged back to his SNF. It has an odor, drainage, and worsening pain. He denies fever, chills, chest pain, SOB, severe leg or foot pain.  HPI  Past Medical History:  Diagnosis Date  . Anemia   . Asthma   . BPH (benign prostatic hyperplasia)   . Cellulitis and abscess of foot 07/2015  . Chronic kidney disease    CKD stage 3  . CVA (cerebral vascular accident) ([place]) 2015   denies residual on 08/15/2015  . Hepatitis C    states he was diagnosed in 2007 or 2007 while living in [place], [place]  . Hyperlipidemia   . Hypertension   . Ischemia of foot 05/31/2016  . Peripheral neuropathy   . Pneumonia X 1  . PVD (peripheral vascular disease) ([place])   . Type II diabetes mellitus ([place])   . Wet gangrene ([place]) 06/10/2016    Patient Active Problem List   Diagnosis Date Noted  . S/P transmetatarsal amputation of foot, right ([place]) 06/26/2016  . Constipation due to opioid therapy 06/15/2016  . Physical deconditioning 06/10/2016  . Wet gangrene ([place]) 06/10/2016  . Anemia, chronic disease 06/10/2016  . Chronic diastolic CHF (congestive heart failure) ([place]) 06/10/2016  . Ischemic pain of right foot 06/04/2016  . Ischemia of foot 05/31/2016  . Chronic gout due to renal impairment involving toe of right foot without  tophus 02/05/2016  . Dyslipidemia associated with type 2 diabetes mellitus ([place]) 01/21/2016  . Uncontrolled type II diabetes with peripheral autonomic neuropathy ([place]) 01/17/2016  . Weight gain 12/20/2015  . Edema of right lower extremity 12/20/2015  . Cough 11/26/2015  . GERD (gastroesophageal reflux disease) 10/08/2015  . Amputation of left lower extremity below knee ([place]) 08/21/2015  . Phantom limb pain ([place])   . Anemia of chronic disease   . BPH (benign prostatic hyperplasia)   . ETOH abuse   . PVD (peripheral vascular disease) ([place])   . Chronic hepatitis C without hepatic coma ([place])   . History of CVA (cerebrovascular accident) without residual deficits   . Hyponatremia   . Atherosclerosis of left lower extremity with ulceration of midfoot ([place])   . Chronic kidney disease, stage 3 07/18/2015  . Essential hypertension 07/18/2015  . S/P lumbar spinal fusion 11/25/2013  . Melena 12/03/2011  . Hemiparesthesia 02/03/2011  . Polysubstance abuse 02/03/2011  . Tobacco abuse 02/03/2011    Past Surgical History:  Procedure Laterality Date  . ABDOMINAL AORTOGRAM N/A 06/03/2016   Procedure: Abdominal Aortogram;  Surgeon: Waynetta Sandy, MD;  Location: [facility] LAB;  Service: Cardiovascular;  Laterality: N/A;  . AMPUTATION Left 08/16/2015   Procedure: LEFT BELOW THE KNEE AMPUTATION ;  Surgeon: Serafina Mitchell, MD;  Location: [facility] OR;  Service: Vascular;  Laterality: Left;  . BACK SURGERY    . BELOW KNEE LEG AMPUTATION Left 08/16/2015  . BIOPSY N/A 09/03/2012   Procedure: BIOPSY;  Surgeon: Daneil Dolin, MD;  Location: [facility] ORS;  Service: Endoscopy;  Laterality: N/A;  gastric and gastric mucosa  . BIOPSY N/A 12/03/2012   Procedure: BIOPSY;  Surgeon: Daneil Dolin, MD;  Location: [facility];  Service: Endoscopy;  Laterality: N/A;  . COLONOSCOPY WITH PROPOFOL N/A 09/03/2012   [id number] polyp-removed as outlined above. Prominent internal hemorrhoids. Tubular adenoma  .  ESOPHAGOGASTRODUODENOSCOPY (EGD) WITH PROPOFOL N/A 09/03/2012   [id number] hernia. Gastric diverticulum. Gastric ulcers with associated erosions. Duodenal erosions. Status post gastric biopsy. H.PYLORI gastritis   . ESOPHAGOGASTRODUODENOSCOPY (EGD) WITH PROPOFOL N/A 12/03/2012   Dr. Gala Romney: gastric diverticulum, gastric erosions and scar. Previously noted gastric ulcer completed healed. Biopsy without H.pylori.   . ESOPHAGOGASTRODUODENOSCOPY (EGD) WITH PROPOFOL N/A 01/23/2016   Procedure: ESOPHAGOGASTRODUODENOSCOPY (EGD) WITH PROPOFOL;  Surgeon: Doran Stabler, MD;  Location: [facility] END[facility];  Service: Gastroenterology;  Laterality: N/A;  . LIVER BIOPSY  2005   Done in [place], [place]. Chronic hepatitis with mild periportal inflammation, lobular unicellular necrosis and portal fibrosis. Grade 2, stage 1-2.  . LOWER EXTREMITY ANGIOGRAPHY Right 06/03/2016   Procedure: Lower Extremity Angiography;  Surgeon: Waynetta Sandy, MD;  Location: [facility] LAB;  Service: Cardiovascular;  Laterality: Right;  . MAXIMUM ACCESS (MAS)POSTERIOR LUMBAR INTERBODY FUSION (PLIF) 1 LEVEL N/A 11/25/2013   Procedure: FOR MAXIMUM ACCESS (MAS) POSTERIOR LUMBAR INTERBODY FUSION (PLIF) 1 LEVEL;  Surgeon: Eustace Moore, MD;  Location: [facility] NEURO ORS;  Service: Neurosurgery;  Laterality: N/A;  FOR MAXIMUM ACCESS (MAS) POSTERIOR LUMBAR INTERBODY FUSION (PLIF) 1 LEVEL LUMBAR 3-4  . PERIPHERAL VASCULAR ATHERECTOMY Right 06/03/2016   Procedure: Peripheral Vascular Atherectomy;  Surgeon: Waynetta Sandy, MD;  Location: [facility] LAB;  Service: Cardiovascular;  Laterality: Right;  SFA WITH STENT  . PERIPHERAL VASCULAR CATHETERIZATION Left 08/01/2015   Procedure: Lower Extremity Angiography;  Surgeon: Serafina Mitchell, MD;  Location: [facility] CV LAB;  Service: Cardiovascular;  Laterality: Left;  . PERIPHERAL VASCULAR CATHETERIZATION N/A 08/01/2015   Procedure: Abdominal Aortogram;  Surgeon: Serafina Mitchell, MD;   Location: [facility] CV LAB;  Service: Cardiovascular;  Laterality: N/A;  . PERIPHERAL VASCULAR CATHETERIZATION N/A 08/08/2015   Procedure: Abdominal Aortogram w/Lower Extremity;  Surgeon: Serafina Mitchell, MD;  Location: [facility] LAB;  Service: Cardiovascular;  Laterality: N/A;  . PERIPHERAL VASCULAR CATHETERIZATION Left 08/08/2015   Procedure: Peripheral Vascular Balloon Angioplasty;  Surgeon: Serafina Mitchell, MD;  Location: [facility] LAB;  Service: Cardiovascular;  Laterality: Left;  left popiteal artery, left peronealtrunk, left post tibial  . POLYPECTOMY N/A 09/03/2012   Procedure: POLYPECTOMY;  Surgeon: Daneil Dolin, MD;  Location: [facility];  Service: Endoscopy;  Laterality: N/A;  cecal polyp  . TONSILLECTOMY    . TRANSMETATARSAL AMPUTATION Right 06/13/2016   Procedure: RIGHT TRANSMETATARSAL AMPUTATION;  Surgeon: Waynetta Sandy, MD;  Location: [facility];  Service: Vascular;  Laterality: Right;       Home Medications    Prior to Admission medications   Medication Sig Start Date End Date Taking? Authorizing Provider  allopurinol (ZYLOPRIM) 100 MG tablet Take 200 mg by mouth daily after supper.     [provider]  aspirin 325 MG tablet Take 325 mg by mouth daily.    [provider]  carvedilol (  COREG) 3.125 MG tablet Take 1 tablet (3.125 mg total) by mouth 2 (two) times daily with a meal. 08/10/15   Mikhail, Velta Addison, DO  cephALEXin (KEFLEX) 500 MG capsule Take 1 capsule (500 mg total) by mouth 4 (four) times daily. 06/27/16   Charlann Lange, PA-C  clopidogrel (PLAVIX) 75 MG tablet Take 1 tablet (75 mg total) by mouth daily with breakfast. 06/05/16   Dhungel, Nishant, MD  ferrous sulfate 325 (65 FE) MG tablet Take 325 mg by mouth 3 (three) times daily with meals.    [provider]  gabapentin (NEURONTIN) 600 MG tablet Take 600-1,200 mg by mouth See admin instructions. Take 1 tablet (600 mg) by mouth twice daily - morning and afternoon and take 2  tablets (1200 mg) at bedtime    [provider]  insulin aspart (NOVOLOG) 100 UNIT/ML injection Inject 18 Units into the skin 3 (three) times daily before meals.     [provider]  insulin glargine (LANTUS) 100 UNIT/ML injection Inject 0.5 mLs (50 Units total) into the skin at bedtime. 06/17/16   Rama, Venetia Maxon, MD  lisinopril (PRINIVIL,ZESTRIL) 5 MG tablet Take 5 mg by mouth daily.     [provider]  magnesium hydroxide (MILK OF MAGNESIA) 400 MG/5ML suspension Take 30 mLs by mouth daily as needed for mild constipation.    [provider]  metFORMIN (GLUCOPHAGE) 500 MG tablet Take 500 mg by mouth daily with breakfast.    [provider]  methocarbamol (ROBAXIN) 500 MG tablet Take 500 mg by mouth every 6 (six) hours as needed for muscle spasms.     [provider]  morphine (MS CONTIN) 15 MG 12 hr tablet Take 1 tablet (15 mg total) by mouth every 12 (twelve) hours. 06/19/16   Estill Dooms, MD  Oxycodone HCl 10 MG TABS Take 1-1.5 tablets (10-15 mg total) by mouth every 4 (four) hours as needed. 06/19/16   Estill Dooms, MD  polyethylene glycol [facility] / Floria Raveling) packet Take 17 g by mouth daily. 06/05/16   Dhungel, Nishant, MD  senna-docusate (SENOKOT-S) 8.6-50 MG tablet Take 1 tablet by mouth 2 (two) times daily. 06/17/16   Rama, Venetia Maxon, MD  simvastatin (ZOCOR) 20 MG tablet Take 20 mg by mouth daily.     [provider]  tamsulosin (FLOMAX) 0.4 MG CAPS capsule Take 1 capsule (0.4 mg total) by mouth daily. 08/10/15   Mikhail, Velta Addison, DO  torsemide (DEMADEX) 20 MG tablet Take 20 mg by mouth daily.     [provider]    Family History Family History  Problem Relation Age of Onset  . Breast cancer Mother   . Heart failure Mother   . Diabetes Father   . Hypertension Father   . Heart disease Father   . Hyperlipidemia Father   . Diabetes Sister   . Hypertension Sister   . Breast cancer Sister   . Colon cancer Neg Hx    . Liver disease Neg Hx     Social History Social History  Substance Use Topics  . Smoking status: Former Smoker    Packs/day: 1.00    Years: 47.00    Types: Cigarettes    Quit date: 07/13/2015  . Smokeless tobacco: Never Used  . Alcohol use No     Comment:  none 08/15/2015 "stopped drinking 3-4 years ago"     Allergies   Patient has no known allergies.   Review of Systems Review of Systems  Constitutional: Negative  for fever.  Respiratory: Negative for shortness of breath.   Cardiovascular: Negative for chest pain.  Musculoskeletal: Positive for arthralgias and joint swelling.  Skin: Positive for wound.  Neurological: Positive for numbness. Negative for weakness.     Physical Exam Updated Vital Signs BP 123/83 (BP Location: Right Arm)   Pulse 91   Temp 98 F (36.7 C) (Oral)   Resp 16   Ht 6' 1"  (1.854 m)   Wt 86.2 kg (190 lb)   SpO2 97%   BMI 25.07 kg/m   Physical Exam  Constitutional: He is oriented to person, place, and time. He appears well-developed and well-nourished. No distress.  HENT:  Head: Normocephalic and atraumatic.  Eyes: Conjunctivae are normal. Pupils are equal, round, and reactive to light. Right eye exhibits no discharge. Left eye exhibits no discharge. No scleral icterus.  Neck: Normal range of motion.  Cardiovascular: Normal rate and regular rhythm.  Exam reveals no gallop and no friction rub.   No murmur heard. Pulmonary/Chest: Effort normal and breath sounds normal. No respiratory distress. He has no wheezes. He has no rales. He exhibits no tenderness.  Abdominal: He exhibits no distension.  Musculoskeletal:  Left leg: BKA  Right leg: Post-surgical TMA with mild tenderness to palpation of 1st and 5th metatarsal. Wound has minimal drainage and pink edges. See picture for details   Neurological: He is alert and oriented to person, place, and time.  Skin: Skin is warm and dry.  Psychiatric: He has a normal mood and affect. His behavior  is normal.  Nursing note and vitals reviewed.          ED Treatments / Results  Labs (all labs ordered are listed, but only abnormal results are displayed) Labs Reviewed  COMPREHENSIVE METABOLIC PANEL - Abnormal; Notable for the following:       Result Value   Sodium 133 (*)    Chloride 97 (*)    Total Protein 8.8 (*)    Albumin 3.1 (*)    ALT 11 (*)    Total Bilirubin 0.2 (*)    All other components within normal limits  CBC WITH DIFFERENTIAL/PLATELET - Abnormal; Notable for the following:    RBC 2.89 (*)    Hemoglobin 7.4 (*)    HCT 24.8 (*)    MCH 25.6 (*)    MCHC 29.8 (*)    RDW 16.3 (*)    Platelets 475 (*)    All other components within normal limits  SEDIMENTATION RATE - Abnormal; Notable for the following:    Sed Rate >140 (*)    All other components within normal limits  C-REACTIVE PROTEIN - Abnormal; Notable for the following:    CRP 2.2 (*)    All other components within normal limits  CBG MONITORING, ED - Abnormal; Notable for the following:    Glucose-Capillary 56 (*)    All other components within normal limits  CBG MONITORING, ED  I-STAT CG4 LACTIC ACID, ED  CBG MONITORING, ED  CBG MONITORING, ED    EKG  EKG Interpretation None       Radiology Dg Foot Complete Right  Result Date: 07/08/2016 CLINICAL DATA:  Right foot infection. EXAM: RIGHT FOOT COMPLETE - 3+ VIEW COMPARISON:  06/27/2016. FINDINGS: Postsurgical changes of right forefoot amputation at the level of the proximal metatarsal bones identified. There is a focal area of bone demineralization involving the distal tip of the remaining first metatarsal bone which is new from previous exam and may reflect underlying  osteomyelitis. Soft tissue swelling overlying the stump is identified. IMPRESSION: 1. Postop change from right forefoot amputation. 2. Focal area of new bone demineralization involving the tip of the remaining first metatarsal bone which may represent superimposed osteomyelitis.  Electronically Signed   By: Kerby Moors M.D.   On: 07/08/2016 15:28    Procedures Procedures (including critical care time)  Medications Ordered in ED Medications  dextrose 50 % solution (25 mLs  Not Given 07/08/16 1749)  vancomycin (VANCOCIN) 1,750 mg in sodium chloride 0.9 % 500 mL IVPB (1,750 mg Intravenous New Bag/Given 07/08/16 1941)  piperacillin-tazobactam (ZOSYN) IVPB 3.375 g (not administered)  vancomycin (VANCOCIN) IVPB 1000 mg/200 mL premix (not administered)  oxyCODONE (Oxy IR/ROXICODONE) immediate release tablet 15 mg (15 mg Oral Given 07/08/16 1626)  dextrose 50 % solution 25 mL (25 mLs Intravenous Given 07/08/16 1730)  piperacillin-tazobactam (ZOSYN) IVPB 3.375 g (3.375 g Intravenous New Bag/Given 07/08/16 1941)  HYDROmorphone (DILAUDID) injection 1 mg (1 mg Intravenous Given 07/08/16 2008)     Initial Impression / Assessment and Plan / ED Course  I have reviewed the triage vital signs and the nursing notes.  Pertinent labs & imaging results that were available during my care of the patient were reviewed by me and considered in my medical decision making (see chart for details).  66 year old male with severe PAD and DM with poor wound healing and possible wound infection. Vital signs are normal. CBC remarkable for anemia at baseline. No leukocytosis. Lactic acid is normal. CMP remarkable for mild hyponatremia and hypochloremia. ESR and CRP are elevated. Xray shows area concerning for osteomyelitis over 1st metatarsal. MRI ordered. Vancomycin and Zosyn ordered. Dose of his home pain medicine given. Shared visit with Dr. Lita Mains. He also required IV dextrose due to low blood sugar.   7:30 PM MRI called nursing staff who notified me that pt is being sent back due to inability to stay still. Will admit to hospitalist for further management. Spoke with Dr. Lorin Mercy who will admit.   Final Clinical Impressions(s) / ED Diagnoses   Final diagnoses:  Right foot infection    New  Prescriptions New Prescriptions   No medications on file     Iris Pert 07/08/16 2024    Julianne Rice, MD 07/18/16 [phone]

## 2016-07-08 NOTE — ED Triage Notes (Signed)
Pt brought in by [facility] due to having an increase in drainage and wound opening up. Pt had toes on right foot amputated a few weeks ago.

## 2016-07-08 NOTE — ED Notes (Signed)
Per MRI, pt unable to complete exam, EDP aware.

## 2016-07-09 ENCOUNTER — Encounter (HOSPITAL_COMMUNITY): Payer: Self-pay | Admitting: General Practice

## 2016-07-09 ENCOUNTER — Encounter (HOSPITAL_COMMUNITY): Payer: Medicaid Other

## 2016-07-09 LAB — PREPARE RBC (CROSSMATCH)

## 2016-07-09 LAB — BASIC METABOLIC PANEL
Anion gap: 9 (ref 5–15)
BUN: 13 mg/dL (ref 6–20)
CALCIUM: 8.8 mg/dL — AB (ref 8.9–10.3)
CO2: 27 mmol/L (ref 22–32)
Chloride: 99 mmol/L — ABNORMAL LOW (ref 101–111)
Creatinine, Ser: 1.14 mg/dL (ref 0.61–1.24)
GFR calc Af Amer: >60 mL/min (ref >60)
GLUCOSE: 102 mg/dL — AB (ref 65–99)
Potassium: 4.2 mmol/L (ref 3.5–5.1)
Sodium: 135 mmol/L (ref 135–145)

## 2016-07-09 LAB — CBC
HEMATOCRIT: 22.3 % — AB (ref 39.0–52.0)
Hemoglobin: 6.6 g/dL — CL (ref 13.0–17.0)
MCH: 25.4 pg — AB (ref 26.0–34.0)
MCHC: 29.6 g/dL — AB (ref 30.0–36.0)
MCV: 85.8 fL (ref 78.0–100.0)
Platelets: 447 10*3/uL — ABNORMAL HIGH (ref 150–400)
RBC: 2.6 MIL/uL — ABNORMAL LOW (ref 4.22–5.81)
RDW: 16.8 % — AB (ref 11.5–15.5)
WBC: 9.4 10*3/uL (ref 4.0–10.5)

## 2016-07-09 LAB — HIV ANTIBODY (ROUTINE TESTING W REFLEX): HIV Screen 4th Generation wRfx: NONREACTIVE

## 2016-07-09 LAB — GLUCOSE, CAPILLARY
GLUCOSE-CAPILLARY: 262 mg/dL — AB (ref 65–99)
GLUCOSE-CAPILLARY: 200 mg/dL — AB (ref 65–99)
Glucose-Capillary: 100 mg/dL — ABNORMAL HIGH (ref 65–99)
Glucose-Capillary: 180 mg/dL — ABNORMAL HIGH (ref 65–99)

## 2016-07-09 LAB — SURGICAL PCR SCREEN
MRSA, PCR: NEGATIVE
Staphylococcus aureus: POSITIVE — AB

## 2016-07-09 MED ORDER — MUPIROCIN 2 % EX OINT
TOPICAL_OINTMENT | Freq: Two times a day (BID) | CUTANEOUS | Status: AC
  Administered 2016-07-09 – 2016-07-13 (×10): via NASAL
  Filled 2016-07-09: qty 22

## 2016-07-09 MED ORDER — GLUCERNA SHAKE PO LIQD
237.0000 mL | Freq: Two times a day (BID) | ORAL | Status: DC
  Administered 2016-07-09 – 2016-07-18 (×13): 237 mL via ORAL

## 2016-07-09 MED ORDER — PRO-STAT SUGAR FREE PO LIQD
30.0000 mL | Freq: Every day | ORAL | Status: DC
  Administered 2016-07-09 – 2016-07-16 (×6): 30 mL via ORAL
  Filled 2016-07-09 (×7): qty 30

## 2016-07-09 MED ORDER — SODIUM CHLORIDE 0.9 % IV SOLN
Freq: Once | INTRAVENOUS | Status: AC
  Administered 2016-07-09: 15:00:00 via INTRAVENOUS

## 2016-07-09 MED ORDER — INSULIN ASPART 100 UNIT/ML ~~LOC~~ SOLN
3.0000 [IU] | Freq: Three times a day (TID) | SUBCUTANEOUS | Status: DC
  Administered 2016-07-09 – 2016-07-16 (×10): 3 [IU] via SUBCUTANEOUS

## 2016-07-09 NOTE — Consult Note (Signed)
Hospital Consult    Reason for Consult:  Right foot wound Requesting Physician:  Dr. Lorin Mercy MRN #:  [id number]  History of Present Illness: This is a 66 y.o. male with history of bka and recent revascularization of right sfa with stenting and subsequent tma. The tma has had marginal healing and now has frankly broken down. Vascular consulted for possible intervention. Denies fevers or chills and has otherwise been feeling well.   Past Medical History:  Diagnosis Date  . Anemia   . Asthma   . BPH (benign prostatic hyperplasia)   . Cellulitis and abscess of foot 07/2015  . Chronic kidney disease    CKD stage 3  . CVA (cerebral vascular accident) ([place]) 2015   denies residual on 08/15/2015  . Hepatitis C    states he was diagnosed in 2007 or 2007 while living in [place], [place]  . Hyperlipidemia   . Hypertension   . Ischemia of foot 05/31/2016  . Peripheral neuropathy   . Pneumonia X 1  . PVD (peripheral vascular disease) ([place])   . Type II diabetes mellitus ([place])   . 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 ABDOMINAL AORTOGRAM N/A 06/03/2016   Procedure: Abdominal Aortogram;  Surgeon: Waynetta Sandy, MD;  Location: [facility];  Service: Cardiovascular;  Laterality: N/A;  . AMPUTATION Left 08/16/2015   Procedure: LEFT BELOW THE KNEE AMPUTATION ;  Surgeon: Serafina Mitchell, MD;  Location: [facility];  Service: Vascular;  Laterality: Left;  . BACK SURGERY    . BELOW KNEE LEG AMPUTATION Left 08/16/2015  . BIOPSY N/A 09/03/2012   Procedure: BIOPSY;  Surgeon: Daneil Dolin, MD;  Location: [facility];  Service: Endoscopy;  Laterality: N/A;  gastric and gastric mucosa  . BIOPSY N/A 12/03/2012   Procedure: BIOPSY;  Surgeon: Daneil Dolin, MD;  Location: [facility];  Service: Endoscopy;  Laterality: N/A;  . COLONOSCOPY WITH PROPOFOL N/A 09/03/2012   YKD:XIPJASN polyp-removed as outlined above. Prominent internal hemorrhoids. Tubular adenoma  .  ESOPHAGOGASTRODUODENOSCOPY (EGD) WITH PROPOFOL N/A 09/03/2012   KNL:ZJQBHA hernia. Gastric diverticulum. Gastric ulcers with associated erosions. Duodenal erosions. Status post gastric biopsy. H.PYLORI gastritis   . ESOPHAGOGASTRODUODENOSCOPY (EGD) WITH PROPOFOL N/A 12/03/2012   Dr. Gala Romney: gastric diverticulum, gastric erosions and scar. Previously noted gastric ulcer completed healed. Biopsy without H.pylori.   . ESOPHAGOGASTRODUODENOSCOPY (EGD) WITH PROPOFOL N/A 01/23/2016   Procedure: ESOPHAGOGASTRODUODENOSCOPY (EGD) WITH PROPOFOL;  Surgeon: Doran Stabler, MD;  [facility]: [facility];  Service: Gastroenterology;  Laterality: N/A;  . LIVER BIOPSY  2005   Done in [place], [place]. Chronic hepatitis with mild periportal inflammation, lobular unicellular necrosis and portal fibrosis. Grade 2, stage 1-2.  . LOWER EXTREMITY ANGIOGRAPHY Right 06/03/2016   Procedure: Lower Extremity Angiography;  Surgeon: Waynetta Sandy, MD;  Location: [facility];  Service: Cardiovascular;  Laterality: Right;  . MAXIMUM ACCESS (MAS)POSTERIOR LUMBAR INTERBODY FUSION (PLIF) 1 LEVEL N/A 11/25/2013   Procedure: FOR MAXIMUM ACCESS (MAS) POSTERIOR LUMBAR INTERBODY FUSION (PLIF) 1 LEVEL;  Surgeon: Eustace Moore, MD;  Location: [facility];  Service: Neurosurgery;  Laterality: N/A;  FOR MAXIMUM ACCESS (MAS) POSTERIOR LUMBAR INTERBODY FUSION (PLIF) 1 LEVEL LUMBAR 3-4  . PERIPHERAL VASCULAR ATHERECTOMY Right 06/03/2016   Procedure: Peripheral Vascular Atherectomy;  Surgeon: Waynetta Sandy, MD;  Location: [facility];  Service: Cardiovascular;  Laterality: Right;  SFA WITH STENT  . PERIPHERAL VASCULAR CATHETERIZATION Left  08/01/2015   Procedure: Lower Extremity Angiography;  Surgeon: Serafina Mitchell, MD;  Location: [facility];  Service: Cardiovascular;  Laterality: Left;  . PERIPHERAL VASCULAR CATHETERIZATION N/A 08/01/2015   Procedure: Abdominal Aortogram;  Surgeon: Serafina Mitchell, MD;   Location: [facility];  Service: Cardiovascular;  Laterality: N/A;  . PERIPHERAL VASCULAR CATHETERIZATION N/A 08/08/2015   Procedure: Abdominal Aortogram w/Lower Extremity;  Surgeon: Serafina Mitchell, MD;  Location: [facility];  Service: Cardiovascular;  Laterality: N/A;  . PERIPHERAL VASCULAR CATHETERIZATION Left 08/08/2015   Procedure: Peripheral Vascular Balloon Angioplasty;  Surgeon: Serafina Mitchell, MD;  Location: [facility];  Service: Cardiovascular;  Laterality: Left;  left popiteal artery, left peronealtrunk, left post tibial  . POLYPECTOMY N/A 09/03/2012   Procedure: POLYPECTOMY;  Surgeon: Daneil Dolin, MD;  [facility];  Service: Endoscopy;  Laterality: N/A;  cecal polyp  . TONSILLECTOMY    . TRANSMETATARSAL AMPUTATION Right 06/13/2016   Procedure: RIGHT TRANSMETATARSAL AMPUTATION;  Surgeon: Waynetta Sandy, MD;  [facility];  Service: Vascular;  Laterality: Right;    No Known Allergies  Prior to Admission medications   Medication Sig Start Date End Date Taking? Authorizing Provider  allopurinol (ZYLOPRIM) 100 MG tablet Take 200 mg by mouth every evening.   Yes [provider]  aspirin EC 81 MG tablet Take 81 mg by mouth daily.   Yes [provider]  carvedilol (COREG) 3.125 MG tablet Take 1 tablet (3.125 mg total) by mouth 2 (two) times daily with a meal. 08/10/15  Yes Mikhail, [place], DO  clopidogrel (PLAVIX) 75 MG tablet Take 1 tablet (75 mg total) by mouth daily with breakfast. Patient taking differently: Take 75 mg by mouth every morning.  06/05/16  Yes Dhungel, Nishant, MD  ferrous sulfate 325 (65 FE) MG tablet Take 325 mg by mouth 3 (three) times daily with meals.   Yes [provider]  gabapentin (NEURONTIN) 600 MG tablet Take 600 mg by mouth 2 (two) times daily.    Yes [provider]  gabapentin (NEURONTIN) 800 MG tablet Take 1,600 mg by mouth at bedtime.   Yes [provider]  insulin aspart  (NOVOLOG) 100 UNIT/ML injection Inject 20 Units into the skin 3 (three) times daily after meals.    Yes [provider]  insulin glargine (LANTUS) 100 UNIT/ML injection Inject 0.5 mLs (50 Units total) into the skin at bedtime. 06/17/16  Yes Rama, Venetia Maxon, MD  lisinopril (PRINIVIL,ZESTRIL) 5 MG tablet Take 5 mg by mouth daily.    Yes [provider]  magnesium hydroxide (MILK OF MAGNESIA) 400 MG/5ML suspension Take 30 mLs by mouth daily as needed for mild constipation.   Yes [provider]  metFORMIN (GLUCOPHAGE) 500 MG tablet Take 500 mg by mouth daily with breakfast.   Yes [provider]  methocarbamol (ROBAXIN) 500 MG tablet Take 500 mg by mouth every 6 (six) hours as needed (for pain).    Yes [provider]  morphine (MS CONTIN) 15 MG 12 hr tablet Take 1 tablet (15 mg total) by mouth every 12 (twelve) hours. 06/19/16  Yes Estill Dooms, MD  Oxycodone HCl 10 MG TABS Take 1-1.5 tablets (10-15 mg total) by mouth every 4 (four) hours as needed. Patient taking differently: Take 10-15 mg by mouth every 4 (four) hours as needed (for pain).  06/19/16  Yes Estill Dooms, MD  polyethylene glycol [facility] Floria [facility]aveling) packet Take 17  g by mouth daily. 06/05/16  Yes Dhungel, Nishant, MD  senna-docusate (SENOKOT-S) 8.6-50 MG tablet Take 1 tablet by mouth 2 (two) times daily. 06/17/16  Yes Rama, Venetia Maxon, MD  simvastatin (ZOCOR) 20 MG tablet Take 20 mg by mouth daily.    Yes [provider]  tamsulosin (FLOMAX) 0.4 MG CAPS capsule Take 1 capsule (0.4 mg total) by mouth daily. 08/10/15  Yes Mikhail, Velta Addison, DO  torsemide (DEMADEX) 20 MG tablet Take 20 mg by mouth daily.    Yes [provider]  cephALEXin (KEFLEX) 500 MG capsule Take 1 capsule (500 mg total) by mouth 4 (four) times daily. Patient not taking: Reported on 07/08/2016 06/27/16   Charlann Lange, PA-C    Social History   Social History  . Marital status: Divorced    Spouse name: N/A    . Number of children: 1  . Years of education: N/A   Occupational History  . disabled    Social History Main Topics  . Smoking status: Former Smoker    Packs/day: 1.00    Years: 47.00    Types: Cigarettes    Quit date: 07/13/2015  . Smokeless tobacco: Never Used  . Alcohol use No     Comment:  none 08/15/2015 "stopped drinking 3-4 years ago"  . Drug use: No     Comment: 08/15/2015 "last used cocaine 3-4 months ago"  . Sexual activity: No   Other Topics Concern  . Not on file   Social History Narrative  . No narrative on file     Family History  Problem Relation Age of Onset  . Breast cancer Mother   . Heart failure Mother   . Diabetes Father   . Hypertension Father   . Heart disease Father   . Hyperlipidemia Father   . Diabetes Sister   . Hypertension Sister   . Breast cancer Sister   . Colon cancer Neg Hx   . Liver disease Neg Hx     REVIEW OF SYSTEMS (negative unless checked):   Cardiac:  []  Chest pain or chest pressure? []  Shortness of breath upon activity? []  Shortness of breath when lying flat? []  Irregular heart rhythm?  Vascular:  []  Pain in calf, thigh, or hip brought on by walking? []  Pain in feet at night that wakes you up from your sleep? []  Blood clot in your veins? [x]  Leg swelling?  Pulmonary:  []  Oxygen at home? []  Productive cough? []  Wheezing?  Neurologic:  []  Sudden weakness in arms or legs? []  Sudden numbness in arms or legs? []  Sudden onset of difficult speaking or slurred speech? []  Temporary loss of vision in one eye? []  Problems with dizziness?  Gastrointestinal:  []  Blood in stool? []  Vomited blood?  Genitourinary:  []  Burning when urinating? []  Blood in urine?  Psychiatric:  []  Major depression  Hematologic:  []  Bleeding problems? []  Problems with blood clotting?  Dermatologic:  [x]  Rashes or ulcers?  Constitutional:  []  Fever or chills?  Ear/Nose/Throat:  []  Change in hearing? []  Nose bleeds? []  Sore  throat?  Musculoskeletal:  []  Back pain? []  Joint pain? []  Muscle pain?    Physical Examination  Vitals:   07/08/16 2235 07/09/16 0836  BP: (!) 126/54 116/80  Pulse: (!) 115 88  Resp: 20   Temp: 99.7 F (37.6 C)    Body mass index is 25.07 kg/m.  General:  WDWN in NAD HENT: WNL, normocephalic Pulmonary: normal non-labored breathing, without Rales, rhonchi,  wheezing Cardiac:  rrr Strong right pt and AT signals Abdomen: soft Musculoskeletal: right tma has fibrinous exudate throughout with drainage, sutures in tact Psychiatric:  Appropriate mood and affect  CBC    Component Value Date/Time   WBC 9.4 07/09/2016 0531   RBC 2.60 (L) 07/09/2016 0531   HGB 6.6 (LL) 07/09/2016 0531   HCT 22.3 (L) 07/09/2016 0531   HCT 26.3 (L) 11/26/2015 1440   HCT 32 10/31/2011 1319   PLT 447 (H) 07/09/2016 0531   MCV 85.8 07/09/2016 0531   MCV 82.8 10/31/2011 1319   MCH 25.4 (L) 07/09/2016 0531   MCHC 29.6 (L) 07/09/2016 0531   RDW 16.8 (H) 07/09/2016 0531   LYMPHSABS 2.6 07/08/2016 1505   MONOABS 0.6 07/08/2016 1505   EOSABS 0.3 07/08/2016 1505   BASOSABS 0.0 07/08/2016 1505    BMET    Component Value Date/Time   NA 135 07/09/2016 0531   NA 134 (A) 12/27/2015   K 4.2 07/09/2016 0531   K 4.0 10/31/2011 1319   CL 99 (L) 07/09/2016 0531   CO2 27 07/09/2016 0531   GLUCOSE 102 (H) 07/09/2016 0531   BUN 13 07/09/2016 0531   BUN 16 12/27/2015   CREATININE 1.14 07/09/2016 0531   CREATININE 1.06 12/22/2015 1551   CALCIUM 8.8 (L) 07/09/2016 0531   CALCIUM 9.0 10/31/2011 1319   GFRNONAA >60 07/09/2016 0531   GFRAA >60 07/09/2016 0531    COAGS: Lab Results  Component Value Date   INR 1.12 06/10/2016   INR 1.17 05/31/2016   INR 1.2 (H) 10/19/2015     ASSESSMENT/PLAN: This is a 66 y.o. male s/p revascularization of right sfa and right tma that has now broken down. Will plan operative debridement with possible wound vac placement tomorrow if time permits. Discussed high  likelihood of proximal amputation in the future.    Jaquana Geiger C. Donzetta Matters, MD [facility] Office: [phone] Pager: [phone]

## 2016-07-09 NOTE — Progress Notes (Signed)
PROGRESS NOTE    KEONTAE Weber  XBW:[id number] DOB: April 17, 1950 DOA: 07/08/2016 PCP: Iona Beard, MD    Brief Narrative:  Cory Weber is a 66 y.o. male with medical history significant of asthma, BPH, CAD, history of CVA, hepatitis C, hyperlipidemia, hypertension, PVD with left BKA, diabetes, and recent hospitalization 05/31/16-06/04/16 for treatment of ischemia of right foot with gangrene, status post arthrectomy and balloon angioplasty of right SFA with stent, discharged on aspirin and Plavix, who was admitted 06/10/16 for severe right foot pain, was subsequently evaluated by vascular surgery and ultimately underwent a right transmetatarsal amputation on 06/13/16.  He was discharged to [facility] on 5/7, but reports his foot hasn't improved. He was admitted for evaluation of the wound infection. Vascular surgery consulted, MRi of the foot ordered and possible debridement in the near future.   Assessment & Plan:   Principal Problem:   Osteomyelitis of foot ([place]) Active Problems:   Essential hypertension   Anemia of chronic disease   PVD (peripheral vascular disease) ([facility])   Amputation of left lower extremity below knee ([facility])   Dyslipidemia associated with type 2 diabetes mellitus ([facility])   Ischemia of foot   Chronic diastolic CHF (congestive heart failure) ([facility])   Constipation due to opioid therapy   S/P transmetatarsal amputation of foot, right ([facility])   Right foot osteomyelitis:  Admitted for IV antibiotics, possible debridement vs amputation,.  Vascular surgery consulted and recommendations given.  Pain control.     Hypertension:  - well controlled.  Resume home meds.     Anemia of  Blood loss possibly from the surgery / anemia from acute illness with a component of anemia of chronic disease.  He received two units prbc last hospitalization earlier this month.  His hemoglobin dropped from 7.4 on admission to 6.6 today.  2 units of prbc transfusion ordered and repeat H&H  ordered for tomorrow.  His ferritin level in 2017 is 8.  Repeat anemia panel this admission.    PVD:  Further management as per Dr Donzetta Matters.   Diabetes Mellitus:  CBG (last 3)   Recent Labs  07/08/16 2312 07/09/16 0620 07/09/16 1159  GLUCAP 202* 100* 262*    Resume mod SSI and lantus.  Add on 3 units of TIDAC novolog.     H/o Chronic diastolic heart failure:  Appears to be compensated.  Resume home meds.      DVT prophylaxis: (Lovenox/) Code Status: (Full) Family Communication: none at bedside.  Disposition Plan:pending further eval by vascular.    Consultants:   Vascular surgery.   Procedures: MRI of the foot  Debridement of the right foot to be scheduled  By vascular Dr Donzetta Matters.    Antimicrobials: vancomycin and zosyn from 5/28   Subjective: No new complaints.   Objective: Vitals:   07/08/16 2030 07/08/16 2235 07/09/16 0836 07/09/16 1223  BP:  (!) 126/54 116/80 124/62  Pulse: (!) 104 (!) 115 88 (!) 103  Resp: 18 20  20   Temp:  99.7 F (37.6 C)  98.8 F (37.1 C)  TempSrc:  Oral  Oral  SpO2: 99% 97%  100%  Weight:      Height:        Intake/Output Summary (Last 24 hours) at 07/09/16 1225 Last data filed at 07/09/16 0830  Gross per 24 hour  Intake              240 ml  Output  0 ml  Net              240 ml   Filed Weights   07/08/16 1424  Weight: 86.2 kg (190 lb)    Examination:  General exam: Appears calm and comfortable  Respiratory system: Clear to auscultation. Respiratory effort normal. Cardiovascular system: S1 & S2 heard, RRR. No JVD, murmurs, rubs, gallops or clicks. No pedal edema. Gastrointestinal system: Abdomen is nondistended, soft and nontender. No organomegaly or masses felt. Normal bowel sounds heard. Central nervous system: sleeping, full neuro exam couldn't be done. . Extremities: right foot s/p transmetatarsal amp , foul smelling purulent discharge from the foot. Bandaged. Its soaked with discharge. Left BKA.    Skin: see above     Data Reviewed: I have personally reviewed following labs and imaging studies  CBC:  Recent Labs Lab 07/08/16 1505 07/09/16 0531  WBC 9.8 9.4  NEUTROABS 6.3  --   HGB 7.4* 6.6*  HCT 24.8* 22.3*  MCV 85.8 85.8  PLT 475* 973*   Basic Metabolic Panel:  Recent Labs Lab 07/08/16 1505 07/09/16 0531  NA 133* 135  K 4.2 4.2  CL 97* 99*  CO2 28 27  GLUCOSE 74 102*  BUN 12 13  CREATININE 1.09 1.14  CALCIUM 9.2 8.8*   GFR: Estimated Creatinine Clearance: 75 mL/min (by C-G formula based on SCr of 1.14 mg/dL). Liver Function Tests:  Recent Labs Lab 07/08/16 1505  AST 16  ALT 11*  ALKPHOS 69  BILITOT 0.2*  PROT 8.8*  ALBUMIN 3.1*   No results for input(s): LIPASE, AMYLASE in the last 168 hours. No results for input(s): AMMONIA in the last 168 hours. Coagulation Profile: No results for input(s): INR, PROTIME in the last 168 hours. Cardiac Enzymes: No results for input(s): CKTOTAL, CKMB, CKMBINDEX, TROPONINI in the last 168 hours. BNP (last 3 results) No results for input(s): PROBNP in the last 8760 hours. HbA1C: No results for input(s): HGBA1C in the last 72 hours. CBG:  Recent Labs Lab 07/08/16 1818 07/08/16 1940 07/08/16 2312 07/09/16 0620 07/09/16 1159  GLUCAP 87 73 202* 100* 262*   Lipid Profile: No results for input(s): CHOL, HDL, LDLCALC, TRIG, CHOLHDL, LDLDIRECT in the last 72 hours. Thyroid Function Tests: No results for input(s): TSH, T4TOTAL, FREET4, T3FREE, THYROIDAB in the last 72 hours. Anemia Panel: No results for input(s): VITAMINB12, FOLATE, FERRITIN, TIBC, IRON, RETICCTPCT in the last 72 hours. Sepsis Labs:  Recent Labs Lab 07/08/16 1521  LATICACIDVEN 1.31    Recent Results (from the past 240 hour(s))  Surgical PCR screen     Status: Abnormal   Collection Time: 07/09/16  8:52 AM  Result Value Ref Range Status   MRSA, PCR NEGATIVE NEGATIVE Final   Staphylococcus aureus POSITIVE (A) NEGATIVE Final     Comment:        The Xpert SA Assay (FDA approved for NASAL specimens in patients over 54 years of age), is one component of a comprehensive surveillance program.  Test performance has been validated by [facility] for patients greater than or equal to 66 year old. It is not intended to diagnose infection nor to guide or monitor treatment.          Radiology Studies: Dg Foot Complete Right  Result Date: 07/08/2016 CLINICAL DATA:  Right foot infection. EXAM: RIGHT FOOT COMPLETE - 3+ VIEW COMPARISON:  06/27/2016. FINDINGS: Postsurgical changes of right forefoot amputation at the level of the proximal metatarsal bones identified. There is a focal area  of bone demineralization involving the distal tip of the remaining first metatarsal bone which is new from previous exam and may reflect underlying osteomyelitis. Soft tissue swelling overlying the stump is identified. IMPRESSION: 1. Postop change from right forefoot amputation. 2. Focal area of new bone demineralization involving the tip of the remaining first metatarsal bone which may represent superimposed osteomyelitis. Electronically Signed   By: Kerby Moors M.D.   On: 07/08/2016 15:28        Scheduled Meds: . allopurinol  200 mg Oral QPC supper  . aspirin  325 mg Oral Daily  . carvedilol  3.125 mg Oral BID WC  . enoxaparin (LOVENOX) injection  40 mg Subcutaneous Q24H  . ferrous sulfate  325 mg Oral TID WC  . gabapentin  600 mg Oral BID   And  . gabapentin  1,200 mg Oral QHS  . insulin aspart  0-15 Units Subcutaneous TID WC  . insulin glargine  25 Units Subcutaneous QHS  . lisinopril  5 mg Oral Daily  . morphine  15 mg Oral Q12H  . polyethylene glycol  17 g Oral Daily  . senna-docusate  1 tablet Oral BID  . simvastatin  20 mg Oral Daily  . tamsulosin  0.4 mg Oral Daily   Continuous Infusions: . sodium chloride    . piperacillin-tazobactam (ZOSYN)  IV 3.375 g (07/09/16 1146)  . vancomycin Stopped (07/09/16 0936)      LOS: 1 day    Time spent: 35 minutes.     Hosie Poisson, MD [facility] Pager [phone]   If 7PM-7AM, please contact night-coverage [email or web address] Password [facility] 07/09/2016, 12:25 PM

## 2016-07-09 NOTE — Progress Notes (Signed)
Initial Nutrition Assessment  DOCUMENTATION CODES:   Not applicable  INTERVENTION:  Provide Glucerna Shake po BID, each supplement provides 220 kcal and 10 grams of protein.  Provide 30 ml Prostat po once daily, each supplement provides 100 kcal and 15 grams of protein.   Encourage adequate PO intake.   NUTRITION DIAGNOSIS:   Increased nutrient needs related to wound healing as evidenced by estimated needs.  GOAL:   Patient will meet greater than or equal to 90% of their needs  MONITOR:   PO intake, Supplement acceptance, Labs, Weight trends, Skin, I & O's  REASON FOR ASSESSMENT:   Consult Wound healing  ASSESSMENT:   66 y.o. male with medical history significant of asthma, BPH, CAD, history of CVA, hepatitis C, hyperlipidemia, hypertension, PVD with left BKA, diabetes, and recent hospitalization 05/31/16-06/04/16 for treatment of ischemia of right foot with gangrene, status post arthrectomy and balloon angioplasty of right SFA with stent, discharged on aspirin and Plavix, who was admitted 06/10/16 for severe right foot pain, was subsequently evaluated by vascular surgery and ultimately underwent a right transmetatarsal amputation on 06/13/16.  He was discharged to [facility] on 5/7, but reports his foot hasn't improved. He was admitted for evaluation of the wound infection. Vascular surgery consulted, MRi of the foot ordered and possible debridement   Meal completion has been 75%. Pt reports having a good appetite currently and PTA with no other difficulties. Per Epic weight records, pt with a 25% weight loss in 2 months. Question accuracy? RD to monitor closely. Pt is agreeable to nutritional supplements to aid in wound healing. RD to order.  Limited Nutrition-Focused physical exam completed. Findings are no fat depletion, no muscle depletion, and no edema. Unable to observed lower extremities during time of visit.   Labs and medications reviewed.   Diet Order:  Diet Carb Modified  Fluid consistency: Thin; Room service appropriate? Yes Diet NPO time specified  Skin:   (Incision on R foot)  Last BM:  5/28  Height:   Ht Readings from Last 1 Encounters:  07/08/16 6\' 1"  (1.854 m)    Weight:   Wt Readings from Last 1 Encounters:  07/08/16 190 lb (86.2 kg)    Ideal Body Weight:  78.1 kg (adjusted for L BKA)  BMI:  Body mass index is 25.07 kg/m.  Estimated Nutritional Needs:   Kcal:  2150-2400  Protein:  110-130 grams  Fluid:  2.1 - 2.4 L/day  EDUCATION NEEDS:   No education needs identified at this time  Corrin Parker, MS, RD, LDN Pager # [phone] After hours/ weekend pager # [phone]

## 2016-07-09 NOTE — Consult Note (Addendum)
[facility] Nurse wound consult note Reason for Consult: Right foot wounds Wound type: full thickness Pressure Injury POA: N/A, not pressure related Measurement: 13cm x 2.75cm x 1cm (hole open on medial side of wound) Wound bed: 70% yellow slough, 30% black eschar, sutures still intact Drainage (amount, consistency, odor) serousangenous, moderate, malodorous Periwound: dry but intact Dressing procedure/placement/frequency: Have placed orders for nursing for NS moist to dry dressings BID until Ortho consult complete. Will watch to make sure they order wound care after ruling out osteo or debriding. Pt scheduled for ABI today. Ortho consult has already been placed.  Prevalon boot ordered for right foot, pt states he is basically bedbound.  We will not follow, but will remain available to this patient, to nursing, and the medical and/or surgical teams. Please re-consult if we need to assist further.    Fara Olden, RN-C, WTA-C Wound Treatment Associate  Addendum, Vascular consult present, states pt will go for procedure for debridement in the next couple of days, continue NS moistened gauze dressing changes until then.  Please re-consult if we need to assist further.     Fara Olden, RN-C, WTA-C Wound Treatment Associate

## 2016-07-09 NOTE — Progress Notes (Signed)
Inpatient Diabetes Program Recommendations  AACE/ADA: New Consensus Statement on Inpatient Glycemic Control (2015)  Target Ranges:  Prepandial:   less than 140 mg/dL      Peak postprandial:   less than 180 mg/dL (1-2 hours)      Critically ill patients:  140 - 180 mg/dL   Review of Glycemic Control  Diabetes history: DM 2 Outpatient Diabetes medications: Lantus 50 units QHS, Novolog 20 units tid, Metformin 500 mg Daily Current orders for Inpatient glycemic control: Lantus 25 units, Novolog Moderate Correction tid  Inpatient Diabetes Program Recommendations:    Consider another A1c level to assess glucose control over the past 30 days at [facility]. Will watch trends while here.  Thanks,  Tama Headings RN, MSN, [facility] Inpatient Diabetes Coordinator Team Pager [phone] (8a-5p)

## 2016-07-09 NOTE — Progress Notes (Signed)
Dr. Karleen Hampshire returned RN's call and informed that she will enter orders for a blood transfusion. Nursing will continue to monitor.

## 2016-07-09 NOTE — Progress Notes (Signed)
CRITICAL VALUE ALERT  Critical Value:  6.6 hemoglobin  Date & Time Notied:  07/09/2016 @ 0700  Provider Notified: [facility], Karleen Hampshire, MD)  Orders Received/Actions taken: pending return call for orders

## 2016-07-10 ENCOUNTER — Encounter (HOSPITAL_COMMUNITY)
Admission: EM | Disposition: A | Discharge status: Skilled Nursing Facility | Payer: Self-pay | Source: Home / Self Care | Attending: Internal Medicine

## 2016-07-10 ENCOUNTER — Encounter (HOSPITAL_COMMUNITY): Payer: Self-pay | Admitting: Certified Registered Nurse Anesthetist

## 2016-07-10 ENCOUNTER — Inpatient Hospital Stay (HOSPITAL_COMMUNITY): Payer: Medicaid Other | Admitting: Anesthesiology

## 2016-07-10 DIAGNOSIS — T814XXA Infection following a procedure, initial encounter: Secondary | ICD-10-CM

## 2016-07-10 DIAGNOSIS — I5032 Chronic diastolic (congestive) heart failure: Secondary | ICD-10-CM

## 2016-07-10 DIAGNOSIS — Z89512 Acquired absence of left leg below knee: Secondary | ICD-10-CM

## 2016-07-10 DIAGNOSIS — M869 Osteomyelitis, unspecified: Secondary | ICD-10-CM

## 2016-07-10 HISTORY — PX: TRANSMETATARSAL AMPUTATION: SHX6197

## 2016-07-10 LAB — RAPID URINE DRUG SCREEN, HOSP PERFORMED
Amphetamines: NOT DETECTED
BARBITURATES: NOT DETECTED
BENZODIAZEPINES: NOT DETECTED
COCAINE: NOT DETECTED
OPIATES: POSITIVE — AB
TETRAHYDROCANNABINOL: NOT DETECTED

## 2016-07-10 LAB — GLUCOSE, CAPILLARY
GLUCOSE-CAPILLARY: 120 mg/dL — AB (ref 65–99)
Glucose-Capillary: 147 mg/dL — ABNORMAL HIGH (ref 65–99)
Glucose-Capillary: 118 mg/dL — ABNORMAL HIGH (ref 65–99)
Glucose-Capillary: 110 mg/dL — ABNORMAL HIGH (ref 65–99)
Glucose-Capillary: 95 mg/dL (ref 65–99)
Glucose-Capillary: 95 mg/dL (ref 65–99)
Glucose-Capillary: 141 mg/dL — ABNORMAL HIGH (ref 65–99)

## 2016-07-10 LAB — BASIC METABOLIC PANEL
ANION GAP: 5 (ref 5–15)
BUN: 14 mg/dL (ref 6–20)
CO2: 27 mmol/L (ref 22–32)
Calcium: 8.8 mg/dL — ABNORMAL LOW (ref 8.9–10.3)
Chloride: 101 mmol/L (ref 101–111)
Creatinine, Ser: 1.06 mg/dL (ref 0.61–1.24)
Glucose, Bld: 158 mg/dL — ABNORMAL HIGH (ref 65–99)
POTASSIUM: 4.5 mmol/L (ref 3.5–5.1)
SODIUM: 133 mmol/L — AB (ref 135–145)

## 2016-07-10 LAB — CBC
HEMATOCRIT: 27.2 % — AB (ref 39.0–52.0)
Hemoglobin: 8.5 g/dL — ABNORMAL LOW (ref 13.0–17.0)
MCH: 27 pg (ref 26.0–34.0)
MCHC: 31.3 g/dL (ref 30.0–36.0)
MCV: 86.3 fL (ref 78.0–100.0)
Platelets: 412 10*3/uL — ABNORMAL HIGH (ref 150–400)
RBC: 3.15 MIL/uL — AB (ref 4.22–5.81)
RDW: 16.1 % — ABNORMAL HIGH (ref 11.5–15.5)
WBC: 9.9 10*3/uL (ref 4.0–10.5)

## 2016-07-10 SURGERY — AMPUTATION, FOOT, TRANSMETATARSAL
Anesthesia: General | Site: Leg Lower | Laterality: Right

## 2016-07-10 MED ORDER — OXYCODONE HCL 5 MG PO TABS
5.0000 mg | ORAL_TABLET | Freq: Once | ORAL | Status: AC
  Administered 2016-07-10: 5 mg via ORAL
  Filled 2016-07-10: qty 1

## 2016-07-10 MED ORDER — FENTANYL CITRATE (PF) 100 MCG/2ML IJ SOLN
INTRAMUSCULAR | Status: DC | PRN
  Administered 2016-07-10 (×4): 50 ug via INTRAVENOUS

## 2016-07-10 MED ORDER — PROPOFOL 10 MG/ML IV BOLUS
INTRAVENOUS | Status: DC | PRN
  Administered 2016-07-10: 200 mg via INTRAVENOUS

## 2016-07-10 MED ORDER — ONDANSETRON HCL 4 MG/2ML IJ SOLN: 4.0000 mg | Freq: Once | INTRAMUSCULAR | Status: DC | PRN

## 2016-07-10 MED ORDER — LIDOCAINE 2% (20 MG/ML) 5 ML SYRINGE
INTRAMUSCULAR | Status: AC
  Filled 2016-07-10: qty 15

## 2016-07-10 MED ORDER — FENTANYL CITRATE (PF) 100 MCG/2ML IJ SOLN
INTRAMUSCULAR | Status: AC
  Filled 2016-07-10: qty 2

## 2016-07-10 MED ORDER — FENTANYL CITRATE (PF) 100 MCG/2ML IJ SOLN
25.0000 ug | INTRAMUSCULAR | Status: DC | PRN
  Administered 2016-07-10 (×2): 50 ug via INTRAVENOUS

## 2016-07-10 MED ORDER — 0.9 % SODIUM CHLORIDE (POUR BTL) OPTIME
TOPICAL | Status: DC | PRN
  Administered 2016-07-10: 1000 mL

## 2016-07-10 MED ORDER — ONDANSETRON HCL 4 MG/2ML IJ SOLN
INTRAMUSCULAR | Status: DC | PRN
  Administered 2016-07-10: 4 mg via INTRAVENOUS

## 2016-07-10 MED ORDER — MIDAZOLAM HCL 5 MG/5ML IJ SOLN
INTRAMUSCULAR | Status: DC | PRN
  Administered 2016-07-10: 2 mg via INTRAVENOUS

## 2016-07-10 MED ORDER — FENTANYL CITRATE (PF) 250 MCG/5ML IJ SOLN
INTRAMUSCULAR | Status: AC
  Filled 2016-07-10: qty 5

## 2016-07-10 MED ORDER — MIDAZOLAM HCL 2 MG/2ML IJ SOLN
INTRAMUSCULAR | Status: AC
  Filled 2016-07-10: qty 2

## 2016-07-10 MED ORDER — HEPARIN SODIUM (PORCINE) 1000 UNIT/ML IJ SOLN
INTRAMUSCULAR | Status: AC
  Filled 2016-07-10: qty 3

## 2016-07-10 MED ORDER — LIDOCAINE HCL (CARDIAC) 20 MG/ML IV SOLN
INTRAVENOUS | Status: DC | PRN
  Administered 2016-07-10: 60 mg via INTRAVENOUS

## 2016-07-10 MED ORDER — CEFAZOLIN SODIUM-DEXTROSE 2-3 GM-% IV SOLR
INTRAVENOUS | Status: DC | PRN
  Administered 2016-07-10: 2 g via INTRAVENOUS

## 2016-07-10 MED ORDER — ROCURONIUM BROMIDE 10 MG/ML (PF) SYRINGE
PREFILLED_SYRINGE | INTRAVENOUS | Status: AC
  Filled 2016-07-10: qty 5

## 2016-07-10 MED ORDER — LACTATED RINGERS IV SOLN
INTRAVENOUS | Status: DC
  Administered 2016-07-10 (×2): via INTRAVENOUS

## 2016-07-10 MED ORDER — CLOPIDOGREL BISULFATE 75 MG PO TABS
75.0000 mg | ORAL_TABLET | Freq: Every day | ORAL | Status: DC
  Administered 2016-07-10: 75 mg via ORAL
  Filled 2016-07-10: qty 1

## 2016-07-10 MED ORDER — PROTAMINE SULFATE 10 MG/ML IV SOLN
INTRAVENOUS | Status: AC
  Filled 2016-07-10: qty 10

## 2016-07-10 MED ORDER — PHENYLEPHRINE 40 MCG/ML (10ML) SYRINGE FOR IV PUSH (FOR BLOOD PRESSURE SUPPORT)
PREFILLED_SYRINGE | INTRAVENOUS | Status: AC
  Filled 2016-07-10: qty 10

## 2016-07-10 MED ORDER — FENTANYL CITRATE (PF) 100 MCG/2ML IJ SOLN
50.0000 ug | INTRAMUSCULAR | Status: AC
  Administered 2016-07-10: 50 ug via INTRAVENOUS

## 2016-07-10 SURGICAL SUPPLY — 40 items
BANDAGE ACE 4X5 VEL STRL LF (GAUZE/BANDAGES/DRESSINGS) ×4 IMPLANT
BANDAGE ACE 6X5 VEL STRL LF (GAUZE/BANDAGES/DRESSINGS) IMPLANT
BNDG GAUZE ELAST 4 BULKY (GAUZE/BANDAGES/DRESSINGS) ×4 IMPLANT
CANISTER SUCT 3000ML PPV (MISCELLANEOUS) ×4 IMPLANT
COVER SURGICAL LIGHT HANDLE (MISCELLANEOUS) ×4 IMPLANT
DRAPE HALF SHEET 40X57 (DRAPES) IMPLANT
DRAPE INCISE IOBAN 66X45 STRL (DRAPES) IMPLANT
DRAPE ORTHO SPLIT 77X108 STRL (DRAPES)
DRAPE SURG ORHT 6 SPLT 77X108 (DRAPES) IMPLANT
DRSG ADAPTIC 3X8 NADH LF (GAUZE/BANDAGES/DRESSINGS) IMPLANT
DRSG VAC ATS LRG SENSATRAC (GAUZE/BANDAGES/DRESSINGS) IMPLANT
DRSG VAC ATS MED SENSATRAC (GAUZE/BANDAGES/DRESSINGS) IMPLANT
ELECT REM PT RETURN 9FT ADLT (ELECTROSURGICAL) ×4
ELECTRODE REM PT RTRN 9FT ADLT (ELECTROSURGICAL) ×2 IMPLANT
GAUZE SPONGE 4X4 12PLY STRL (GAUZE/BANDAGES/DRESSINGS) ×4 IMPLANT
GLOVE BIO SURGEON STRL SZ7.5 (GLOVE) ×4 IMPLANT
GOWN STRL REUS W/ TWL LRG LVL3 (GOWN DISPOSABLE) ×2 IMPLANT
GOWN STRL REUS W/ TWL XL LVL3 (GOWN DISPOSABLE) ×2 IMPLANT
GOWN STRL REUS W/TWL LRG LVL3 (GOWN DISPOSABLE) ×4
GOWN STRL REUS W/TWL XL LVL3 (GOWN DISPOSABLE) ×3
HANDPIECE INTERPULSE COAX TIP (DISPOSABLE)
KIT BASIN OR (CUSTOM PROCEDURE TRAY) ×4 IMPLANT
KIT ROOM TURNOVER OR (KITS) ×4 IMPLANT
NS IRRIG 1000ML POUR BTL (IV SOLUTION) ×4 IMPLANT
PACK GENERAL/GYN (CUSTOM PROCEDURE TRAY) ×4 IMPLANT
PACK UNIVERSAL I (CUSTOM PROCEDURE TRAY) IMPLANT
PAD ARMBOARD 7.5X6 YLW CONV (MISCELLANEOUS) ×4 IMPLANT
PAD NEG PRESSURE SENSATRAC (MISCELLANEOUS) IMPLANT
SET HNDPC FAN SPRY TIP SCT (DISPOSABLE) IMPLANT
SPONGE GAUZE 4X4 12PLY STER LF (GAUZE/BANDAGES/DRESSINGS) ×4 IMPLANT
SUT ETHILON 3 0 PS 1 (SUTURE) IMPLANT
SUT VIC AB 2-0 CT1 27 (SUTURE) ×4
SUT VIC AB 2-0 CT1 TAPERPNT 27 (SUTURE) ×2 IMPLANT
SUT VIC AB 2-0 CTB1 (SUTURE) IMPLANT
SUT VIC AB 3-0 SH 27 (SUTURE)
SUT VIC AB 3-0 SH 27X BRD (SUTURE) IMPLANT
SUT VICRYL 4-0 PS2 18IN ABS (SUTURE) IMPLANT
TOWEL OR 17X24 6PK STRL BLUE (TOWEL DISPOSABLE) ×4 IMPLANT
TOWEL OR 17X26 10 PK STRL BLUE (TOWEL DISPOSABLE) ×4 IMPLANT
WATER STERILE IRR 1000ML POUR (IV SOLUTION) ×4 IMPLANT

## 2016-07-10 NOTE — Op Note (Signed)
    Patient name: Cory Weber MRN: [id number] DOB: [birth year] Sex: male  07/10/2016 Pre-operative Diagnosis: necrosis of right transmetatarsal amputation Post-operative diagnosis:  Same Surgeon:  Eda Paschal. Donzetta Matters, MD Assistant: OR nurse Procedure Performed: Revision of right transmetatarsal amputation  Indications:  66 year old male history of a right femoral artery stenting and transmetatarsal amputation now with breakdown of the wound and indicated for revision of the transmetatarsal amputation of the right.  Findings: There are superficial necrosis of the wound. The first metatarsal bone did not appear healthy and was removed. The wound had adequate bleeding and was packed open.   Procedure:  The patient was identified in the holding area and taken to the operating room and taken to the operating room and placed supine on the operating table and general anesthesia was induced he was sterilely prepped and draped in the right foot and timeout called. A began by removing the existing sutures. A 10 blade was then used to debride the skin and subcutaneous tissue and necrotic muscle. The first metatarsal was then grossly exposed and this was removed back to the joint space and the joint space debrided. There was significant bleeding and hemostasis was obtained and the wound irrigated. A 2-0 vicryl was used to close the soft tissue over the first tarsal-metatarsal joint space. A wet to dry dressing was placed the foot wrapped with dry dressing. All counts were correct. Patient tolerated procedure well without immediate complication.   Blood loss: 100cc.    [name] C. Donzetta Matters, MD [facility] Office: [phone] Pager: [phone]

## 2016-07-10 NOTE — Progress Notes (Addendum)
Patient accidentally hit his surgery site in the bed. Patient is bleeding when RN arrived to his room. RN change dressing per wound care order. Charger informedd, N.p Informed. Affected leg elevated. Will continue to monitor.

## 2016-07-10 NOTE — Anesthesia Procedure Notes (Signed)
Procedure Name: LMA Insertion Date/Time: 07/10/2016 3:45 PM Performed by: Clearnce Sorrel Pre-anesthesia Checklist: Patient identified, Emergency Drugs available, Suction available, Patient being monitored and Timeout performed Patient Re-evaluated:Patient Re-evaluated prior to inductionOxygen Delivery Method: Circle system utilized Preoxygenation: Pre-oxygenation with 100% oxygen Intubation Type: IV induction LMA: LMA inserted LMA Size: 5.0 Number of attempts: 1 Placement Confirmation: positive ETCO2 and breath sounds checked- equal and bilateral Tube secured with: Tape Dental Injury: Teeth and Oropharynx as per pre-operative assessment

## 2016-07-10 NOTE — Anesthesia Preprocedure Evaluation (Addendum)
Anesthesia Evaluation  Patient identified by MRN, date of birth, ID band Patient awake    Reviewed: Allergy & Precautions, NPO status , Patient's Chart, lab work & pertinent test results, reviewed documented beta blocker date and time   History of Anesthesia Complications Negative for: history of anesthetic complications  Airway Mallampati: II  TM Distance: >3 FB Neck ROM: Full    Dental  (+) Edentulous Upper, Edentulous Lower, Dental Advisory Given   Pulmonary former smoker,    Pulmonary exam normal        Cardiovascular hypertension, Pt. on medications and Pt. on home beta blockers + Peripheral Vascular Disease  Normal cardiovascular exam  ECG: SR, rate 91 ECHO:  - Left ventricle: The cavity size was normal. There was mild hypertrophy of the septum with moderate hypertrophy of the posterior wall. Systolic function was normal. The estimated ejection fraction was in the range of 50% to 55%. Wall motion was normal; there were no regional wall motion abnormalities. Features are consistent with a pseudonormal left ventricular filling pattern, with concomitant abnormal relaxation and increased filling pressure (grade 2 diastolic dysfunction). Doppler parameters are consistent with high ventricular filling   pressure. - Aortic valve: Transvalvular velocity was within the normal range.   There was no stenosis. There was no regurgitation. - Mitral valve: Transvalvular velocity was within the normal range.   There was no evidence for stenosis. There was no regurgitation. - Right ventricle: The cavity size was normal. Wall thickness was   normal. Systolic function was normal. - Tricuspid valve: There was trivial regurgitation. - Pulmonary arteries: Systolic pressure was within the normal   range. PA peak pressure: 34 mm Hg (S).   Neuro/Psych CVA, No Residual Symptoms negative neurological ROS  negative psych ROS   GI/Hepatic negative  GI ROS, (+)     substance abuse  , Hepatitis -, C  Endo/Other  diabetes, Type 2, Insulin Dependent, Oral Hypoglycemic Agents  Renal/GU CRFRenal disease  negative genitourinary   Musculoskeletal negative musculoskeletal ROS (+)   Abdominal   Peds negative pediatric ROS (+)  Hematology  (+) anemia ,   Anesthesia Other Findings Gout Hyperlipidemia  Reproductive/Obstetrics negative OB ROS                            Anesthesia Physical  Anesthesia Plan  ASA: III  Anesthesia Plan: General   Post-op Pain Management: GA combined w/ Regional for post-op pain   Induction:   Airway Management Planned: LMA  Additional Equipment:   Intra-op Plan:   Post-operative Plan: Extubation in OR  Informed Consent: I have reviewed the patients History and Physical, chart, labs and discussed the procedure including the risks, benefits and alternatives for the proposed anesthesia with the patient or authorized representative who has indicated his/her understanding and acceptance.   Dental advisory given  Plan Discussed with: CRNA and Surgeon  Anesthesia Plan Comments:        Anesthesia Quick Evaluation

## 2016-07-10 NOTE — Progress Notes (Signed)
  Progress Note    07/10/2016 10:55 AM * No surgery date entered *  Subjective:  No acute issues  Vitals:   07/09/16 1933 07/10/16 0429  BP: (!) 131/54 (!) 149/75  Pulse: 91 76  Resp: 18 18  Temp: 98.8 F (37.1 C) 98.6 F (37 C)    Physical Exam: aaox3 Signals at right pt Necrotic wound of right tma  CBC    Component Value Date/Time   WBC 9.9 07/10/2016 0607   RBC 3.15 (L) 07/10/2016 0607   HGB 8.5 (L) 07/10/2016 0607   HCT 27.2 (L) 07/10/2016 0607   HCT 26.3 (L) 11/26/2015 1440   HCT 32 10/31/2011 1319   PLT 412 (H) 07/10/2016 0607   MCV 86.3 07/10/2016 0607   MCV 82.8 10/31/2011 1319   MCH 27.0 07/10/2016 0607   MCHC 31.3 07/10/2016 0607   RDW 16.1 (H) 07/10/2016 0607   LYMPHSABS 2.6 07/08/2016 1505   MONOABS 0.6 07/08/2016 1505   EOSABS 0.3 07/08/2016 1505   BASOSABS 0.0 07/08/2016 1505    BMET    Component Value Date/Time   NA 133 (L) 07/10/2016 0607   NA 134 (A) 12/27/2015   K 4.5 07/10/2016 0607   K 4.0 10/31/2011 1319   CL 101 07/10/2016 0607   CO2 27 07/10/2016 0607   GLUCOSE 158 (H) 07/10/2016 0607   BUN 14 07/10/2016 0607   BUN 16 12/27/2015   CREATININE 1.06 07/10/2016 0607   CREATININE 1.06 12/22/2015 1551   CALCIUM 8.8 (L) 07/10/2016 0607   CALCIUM 9.0 10/31/2011 1319   GFRNONAA >60 07/10/2016 0607   GFRAA >60 07/10/2016 0607    INR    Component Value Date/Time   INR 1.12 06/10/2016 1316     Intake/Output Summary (Last 24 hours) at 07/10/16 1055 Last data filed at 07/10/16 0830  Gross per 24 hour  Intake             1074 ml  Output             1050 ml  Net               24 ml     Assessment:  66 y.o. male is s/p right tma and sfa stenting  Plan: OR today for debridement of right tma Discussed with podiatry and will possibly need proximal foot amputation   Bekka Qian C. Donzetta Matters, MD [facility] Office: [phone] Pager: [phone]  07/10/2016 10:55 AM

## 2016-07-10 NOTE — Progress Notes (Signed)
Patient Surgery site still bleeding after wound care was given. N.P. Informed. Vascular Surgery oncall informed and ordered to reinforce dressing with ABD pads and Call him back if bleeding still continues. Will continue to monitor.

## 2016-07-10 NOTE — Plan of Care (Signed)
Problem: Safety: Goal: Ability to remain free from injury will improve Outcome: Progressing No falls during this admission. Call bell within reach. Patient alert and oriented. Bed in low and locked position. Clean and clear environment maintained. 3/4 siderails in place. Patient verbalized understanding of safety instruction. Patient on bedrest.   Problem: Pain Management: Goal: General experience of comfort will improve Outcome: Progressing Pain being managed with PO PRN pain medication. Vital signs are stable. No facial grimacing or moaning evident.

## 2016-07-10 NOTE — Transfer of Care (Signed)
Immediate Anesthesia Transfer of Care Note  Patient: Cory Weber  Procedure(s) Performed: Procedure(s): REVISION RIGHT TRANSMETATARSAL AMPUTATION (Right)  Patient Location: PACU  Anesthesia Type:General  Level of Consciousness: awake, alert  and oriented  Airway & Oxygen Therapy: Patient Spontanous Breathing and Patient connected to nasal cannula oxygen  Post-op Assessment: Report given to RN and Post -op Vital signs reviewed and stable  Post vital signs: Reviewed and stable  Last Vitals:  Vitals:   07/09/16 1933 07/10/16 0429  BP: (!) 131/54 (!) 149/75  Pulse: 91 76  Resp: 18 18  Temp: 37.1 C 37 C    Last Pain:  Vitals:   07/10/16 1315  TempSrc:   PainSc: 8          Complications: No apparent anesthesia complications

## 2016-07-10 NOTE — Progress Notes (Signed)
PROGRESS NOTE    DEVARION MCCLANAHAN  ZHY:[id number] DOB: Oct 03, 1950 DOA: 07/08/2016 PCP: Iona Beard, MD    Brief Narrative:  Cory Weber is a 66 y.o. male with medical history significant of asthma, BPH, CAD, history of CVA, hepatitis C, hyperlipidemia, hypertension, PVD with left BKA, diabetes, and recent hospitalization 05/31/16-06/04/16 for treatment of ischemia of right foot with gangrene, status post arthrectomy and balloon angioplasty of right SFA with stent, discharged on aspirin and Plavix, who was admitted 06/10/16 for severe right foot pain, was subsequently evaluated by vascular surgery and ultimately underwent a right transmetatarsal amputation on 06/13/16.  He was discharged to [facility] on 5/7, but reports his foot hasn't improved. He was admitted for evaluation of the wound infection. Vascular surgery consulted, MRi of the foot ordered and possible debridement in the near future.   Assessment & Plan:   Diabetic Right foot Wound -continue Vanc/Zosyn -pain control -VVS Dr.Cain consulting -s/p recent revascularization of right sfa with stenting and subsequent tma, now with breakdown -plan for debridement of R TMA today by Dr.Cain  Hypertension:  - well controlled on home meds  Anemia due to blood loss and chronic disease -received two units prbc last hospitalization earlier this month -s/p 2 units PRBC his admission, monitor Hb  Diabetes Mellitus:  -continue Lantus, SSI -novolog meal coverage  H/o Chronic diastolic heart failure:  Appears to be compensated.  -diuretics on hold  DVT prophylaxis: (Lovenox/) Code Status: (Full) Family Communication: none at bedside.  Disposition Plan:pending further eval by vascular.    Consultants:   Vascular surgery.   Procedures: MRI of the foot  Debridement of the right foot to be scheduled  By vascular Dr Donzetta Matters.    Antimicrobials: vancomycin and zosyn from 5/28   Subjective: -feels ok, waiting for  OR  Objective: Vitals:   07/09/16 1503 07/09/16 1630 07/09/16 1933 07/10/16 0429  BP: 123/73 115/63 (!) 131/54 (!) 149/75  Pulse: 87 88 91 76  Resp: 18 16 18 18   Temp: 98.6 F (37 C) 98.6 F (37 C) 98.8 F (37.1 C) 98.6 F (37 C)  TempSrc: Oral Oral Oral Oral  SpO2: 100% 100% 100% 98%  Weight:      Height:        Intake/Output Summary (Last 24 hours) at 07/10/16 1640 Last data filed at 07/10/16 1624  Gross per 24 hour  Intake             1360 ml  Output             1150 ml  Net              210 ml   Filed Weights   07/08/16 1424  Weight: 86.2 kg (190 lb)    Examination:  General exam: AAOx3, no distress  Respiratory system: CTAB, no rales Cardiovascular system: S1 & S2 heard, RRR. No JVD, murmurs, rubs, gallops or clicks. No pedal edema. Gastrointestinal system: Abdomen is nondistended, soft and nontender. No organomegaly or masses felt. Normal bowel sounds heard. Central nervous system: sleeping, full neuro exam couldn't be done. . Extremities: right foot s/p transmetatarsal amp , foul smelling purulent discharge from the foot. Bandaged. Its soaked with discharge. Left BKA.   Skin: see above     Data Reviewed: I have personally reviewed following labs and imaging studies  CBC:  Recent Labs Lab 07/08/16 1505 07/09/16 0531 07/10/16 0607  WBC 9.8 9.4 9.9  NEUTROABS 6.3  --   --   HGB  7.4* 6.6* 8.5*  HCT 24.8* 22.3* 27.2*  MCV 85.8 85.8 86.3  PLT 475* 447* 778*   Basic Metabolic Panel:  Recent Labs Lab 07/08/16 1505 07/09/16 0531 07/10/16 0607  NA 133* 135 133*  K 4.2 4.2 4.5  CL 97* 99* 101  CO2 28 27 27   GLUCOSE 74 102* 158*  BUN 12 13 14   CREATININE 1.09 1.14 1.06  CALCIUM 9.2 8.8* 8.8*   GFR: Estimated Creatinine Clearance: 80.6 mL/min (by C-G formula based on SCr of 1.06 mg/dL). Liver Function Tests:  Recent Labs Lab 07/08/16 1505  AST 16  ALT 11*  ALKPHOS 69  BILITOT 0.2*  PROT 8.8*  ALBUMIN 3.1*   No results for input(s):  LIPASE, AMYLASE in the last 168 hours. No results for input(s): AMMONIA in the last 168 hours. Coagulation Profile: No results for input(s): INR, PROTIME in the last 168 hours. Cardiac Enzymes: No results for input(s): CKTOTAL, CKMB, CKMBINDEX, TROPONINI in the last 168 hours. BNP (last 3 results) No results for input(s): PROBNP in the last 8760 hours. HbA1C: No results for input(s): HGBA1C in the last 72 hours. CBG:  Recent Labs Lab 07/09/16 2201 07/10/16 0629 07/10/16 1146 07/10/16 1321 07/10/16 1529  GLUCAP 200* 147* 118* 120* 110*   Lipid Profile: No results for input(s): CHOL, HDL, LDLCALC, TRIG, CHOLHDL, LDLDIRECT in the last 72 hours. Thyroid Function Tests: No results for input(s): TSH, T4TOTAL, FREET4, T3FREE, THYROIDAB in the last 72 hours. Anemia Panel: No results for input(s): VITAMINB12, FOLATE, FERRITIN, TIBC, IRON, RETICCTPCT in the last 72 hours. Sepsis Labs:  Recent Labs Lab 07/08/16 1521  LATICACIDVEN 1.31    Recent Results (from the past 240 hour(s))  Blood Cultures x 2 sites     Status: None (Preliminary result)   Collection Time: 07/08/16 10:33 PM  Result Value Ref Range Status   Specimen Description BLOOD RIGHT HAND  Final   Special Requests IN PEDIATRIC BOTTLE Blood Culture adequate volume  Final   Culture NO GROWTH 2 DAYS  Final   Report Status PENDING  Incomplete  Blood Cultures x 2 sites     Status: None (Preliminary result)   Collection Time: 07/08/16 10:33 PM  Result Value Ref Range Status   Specimen Description BLOOD LEFT HAND  Final   Special Requests IN PEDIATRIC BOTTLE Blood Culture adequate volume  Final   Culture NO GROWTH 2 DAYS  Final   Report Status PENDING  Incomplete  Surgical PCR screen     Status: Abnormal   Collection Time: 07/09/16  8:52 AM  Result Value Ref Range Status   MRSA, PCR NEGATIVE NEGATIVE Final   Staphylococcus aureus POSITIVE (A) NEGATIVE Final    Comment:        The Xpert SA Assay (FDA approved for NASAL  specimens in patients over 98 years of age), is one component of a comprehensive surveillance program.  Test performance has been validated by [facility] for patients greater than or equal to 7 year old. It is not intended to diagnose infection nor to guide or monitor treatment.          Radiology Studies: No results found.      Scheduled Meds: . [MAR Hold] allopurinol  200 mg Oral QPC supper  . [MAR Hold] aspirin  325 mg Oral Daily  . [MAR Hold] carvedilol  3.125 mg Oral BID WC  . [MAR Hold] enoxaparin (LOVENOX) injection  40 mg Subcutaneous Q24H  . [MAR Hold] feeding supplement (Dixon)  237 mL Oral BID BM  . [MAR Hold] feeding supplement (PRO-STAT SUGAR FREE 64)  30 mL Oral Daily  . fentaNYL      . [MAR Hold] ferrous sulfate  325 mg Oral TID WC  . [MAR Hold] gabapentin  600 mg Oral BID   And  . [MAR Hold] gabapentin  1,200 mg Oral QHS  . [MAR Hold] insulin aspart  0-15 Units Subcutaneous TID WC  . [MAR Hold] insulin aspart  3 Units Subcutaneous TID WC  . [MAR Hold] insulin glargine  25 Units Subcutaneous QHS  . [MAR Hold] lisinopril  5 mg Oral Daily  . [MAR Hold] morphine  15 mg Oral Q12H  . [MAR Hold] mupirocin ointment   Nasal BID  . [MAR Hold] polyethylene glycol  17 g Oral Daily  . [MAR Hold] senna-docusate  1 tablet Oral BID  . [MAR Hold] simvastatin  20 mg Oral Daily  . [MAR Hold] tamsulosin  0.4 mg Oral Daily   Continuous Infusions: . lactated ringers 10 mL/hr at 07/10/16 1336  . [MAR Hold] piperacillin-tazobactam (ZOSYN)  IV 3.375 g (07/10/16 1024)  . [MAR Hold] vancomycin Stopped (07/10/16 0942)     LOS: 2 days    Time spent: 35 minutes.     Domenic Polite, MD [facility] Pager [phone]   If 7PM-7AM, please contact night-coverage [email or web address] Password [email or web address] 07/10/2016, 4:40 PM

## 2016-07-10 NOTE — Anesthesia Postprocedure Evaluation (Signed)
Anesthesia Post Note  Patient: Cory Weber  Procedure(Weber) Performed: Procedure(Weber) (LRB): REVISION RIGHT TRANSMETATARSAL AMPUTATION (Right)  Patient location during evaluation: PACU Anesthesia Type: General Level of consciousness: awake and alert Pain management: pain level controlled Vital Signs Assessment: post-procedure vital signs reviewed and stable Respiratory status: spontaneous breathing, nonlabored ventilation, respiratory function stable and patient connected to nasal cannula oxygen Cardiovascular status: blood pressure returned to baseline and stable Postop Assessment: no signs of nausea or vomiting Anesthetic complications: no       Last Vitals:  Vitals:   07/10/16 1635 07/10/16 1645  BP: (!) 147/95   Pulse: (!) 105 (!) 104  Resp: 15 (!) 21  Temp: 36.7 C     Last Pain:  Vitals:   07/10/16 1315  TempSrc:   PainSc: 8                  Cory Weber

## 2016-07-11 ENCOUNTER — Encounter (HOSPITAL_COMMUNITY)
Admission: EM | Disposition: A | Discharge status: Skilled Nursing Facility | Payer: Self-pay | Source: Home / Self Care | Attending: Internal Medicine

## 2016-07-11 ENCOUNTER — Inpatient Hospital Stay (HOSPITAL_COMMUNITY): Payer: Medicaid Other | Admitting: Anesthesiology

## 2016-07-11 ENCOUNTER — Encounter (HOSPITAL_COMMUNITY): Payer: Self-pay | Admitting: Vascular Surgery

## 2016-07-11 DIAGNOSIS — T8131XA Disruption of external operation (surgical) wound, not elsewhere classified, initial encounter: Secondary | ICD-10-CM

## 2016-07-11 DIAGNOSIS — M86371 Chronic multifocal osteomyelitis, right ankle and foot: Secondary | ICD-10-CM

## 2016-07-11 DIAGNOSIS — D638 Anemia in other chronic diseases classified elsewhere: Secondary | ICD-10-CM

## 2016-07-11 HISTORY — PX: I&D EXTREMITY: SHX5045

## 2016-07-11 LAB — CBC
HCT: 26.8 % — ABNORMAL LOW (ref 39.0–52.0)
HEMATOCRIT: 22.6 % — AB (ref 39.0–52.0)
HEMOGLOBIN: 8.3 g/dL — AB (ref 13.0–17.0)
HEMOGLOBIN: 7 g/dL — AB (ref 13.0–17.0)
MCH: 27 pg (ref 26.0–34.0)
MCH: 26.9 pg (ref 26.0–34.0)
MCHC: 31 g/dL (ref 30.0–36.0)
MCHC: 31 g/dL (ref 30.0–36.0)
MCV: 87.3 fL (ref 78.0–100.0)
MCV: 86.9 fL (ref 78.0–100.0)
Platelets: 394 10*3/uL (ref 150–400)
Platelets: 297 10*3/uL (ref 150–400)
RBC: 3.07 MIL/uL — AB (ref 4.22–5.81)
RBC: 2.6 MIL/uL — AB (ref 4.22–5.81)
RDW: 16.2 % — ABNORMAL HIGH (ref 11.5–15.5)
RDW: 15.7 % — ABNORMAL HIGH (ref 11.5–15.5)
WBC: 13.2 10*3/uL — ABNORMAL HIGH (ref 4.0–10.5)
WBC: 9.8 10*3/uL (ref 4.0–10.5)

## 2016-07-11 LAB — BASIC METABOLIC PANEL
ANION GAP: 9 (ref 5–15)
BUN: 10 mg/dL (ref 6–20)
CHLORIDE: 100 mmol/L — AB (ref 101–111)
CO2: 25 mmol/L (ref 22–32)
Calcium: 9 mg/dL (ref 8.9–10.3)
Creatinine, Ser: 1.2 mg/dL (ref 0.61–1.24)
GFR calc non Af Amer: >60 mL/min (ref >60)
Glucose, Bld: 128 mg/dL — ABNORMAL HIGH (ref 65–99)
Potassium: 4.3 mmol/L (ref 3.5–5.1)
Sodium: 134 mmol/L — ABNORMAL LOW (ref 135–145)

## 2016-07-11 LAB — GLUCOSE, CAPILLARY
GLUCOSE-CAPILLARY: 135 mg/dL — AB (ref 65–99)
GLUCOSE-CAPILLARY: 103 mg/dL — AB (ref 65–99)
GLUCOSE-CAPILLARY: 100 mg/dL — AB (ref 65–99)
GLUCOSE-CAPILLARY: 187 mg/dL — AB (ref 65–99)
Glucose-Capillary: 148 mg/dL — ABNORMAL HIGH (ref 65–99)

## 2016-07-11 LAB — PREPARE RBC (CROSSMATCH)

## 2016-07-11 SURGERY — IRRIGATION AND DEBRIDEMENT EXTREMITY
Anesthesia: General | Laterality: Right

## 2016-07-11 MED ORDER — HYDROMORPHONE HCL 1 MG/ML IJ SOLN: 0.2500 mg | INTRAMUSCULAR | Status: DC | PRN

## 2016-07-11 MED ORDER — SODIUM CHLORIDE 0.9 % IR SOLN
Status: DC | PRN
  Administered 2016-07-11: 1000 mL

## 2016-07-11 MED ORDER — LIDOCAINE HCL (CARDIAC) 20 MG/ML IV SOLN
INTRAVENOUS | Status: DC | PRN
  Administered 2016-07-11: 20 mg via INTRAVENOUS

## 2016-07-11 MED ORDER — LACTATED RINGERS IV SOLN: INTRAVENOUS | Status: DC

## 2016-07-11 MED ORDER — LIDOCAINE 2% (20 MG/ML) 5 ML SYRINGE
INTRAMUSCULAR | Status: AC
  Filled 2016-07-11: qty 5

## 2016-07-11 MED ORDER — PHENYLEPHRINE HCL 10 MG/ML IJ SOLN
INTRAMUSCULAR | Status: DC | PRN
  Administered 2016-07-11: 160 ug via INTRAVENOUS
  Administered 2016-07-11: 200 ug via INTRAVENOUS
  Administered 2016-07-11 (×2): 80 ug via INTRAVENOUS
  Administered 2016-07-11: 160 ug via INTRAVENOUS
  Administered 2016-07-11: 120 ug via INTRAVENOUS
  Administered 2016-07-11 (×2): 160 ug via INTRAVENOUS

## 2016-07-11 MED ORDER — MIDAZOLAM HCL 2 MG/2ML IJ SOLN
INTRAMUSCULAR | Status: AC
  Filled 2016-07-11: qty 2

## 2016-07-11 MED ORDER — PROMETHAZINE HCL 25 MG/ML IJ SOLN: 6.2500 mg | INTRAMUSCULAR | Status: DC | PRN

## 2016-07-11 MED ORDER — BUPIVACAINE HCL (PF) 0.5 % IJ SOLN
INTRAMUSCULAR | Status: AC
  Filled 2016-07-11: qty 30

## 2016-07-11 MED ORDER — BUPIVACAINE HCL (PF) 0.5 % IJ SOLN
INTRAMUSCULAR | Status: DC | PRN
  Administered 2016-07-11: 10 mL

## 2016-07-11 MED ORDER — LIDOCAINE HCL 2 % IJ SOLN
INTRAMUSCULAR | Status: DC | PRN
  Administered 2016-07-11: 10 mL

## 2016-07-11 MED ORDER — PROPOFOL 10 MG/ML IV BOLUS
INTRAVENOUS | Status: DC | PRN
  Administered 2016-07-11: 200 mg via INTRAVENOUS

## 2016-07-11 MED ORDER — CHLORHEXIDINE GLUCONATE 4 % EX LIQD: 60.0000 mL | Freq: Once | CUTANEOUS | Status: DC

## 2016-07-11 MED ORDER — EPHEDRINE SULFATE 50 MG/ML IJ SOLN
INTRAMUSCULAR | Status: DC | PRN
  Administered 2016-07-11 (×2): 5 mg via INTRAVENOUS

## 2016-07-11 MED ORDER — PROPOFOL 10 MG/ML IV BOLUS
INTRAVENOUS | Status: AC
  Filled 2016-07-11: qty 20

## 2016-07-11 MED ORDER — SODIUM CHLORIDE 0.9 % IV SOLN: 10.0000 mL/h | Freq: Once | INTRAVENOUS | Status: DC

## 2016-07-11 MED ORDER — FENTANYL CITRATE (PF) 250 MCG/5ML IJ SOLN
INTRAMUSCULAR | Status: AC
  Filled 2016-07-11: qty 5

## 2016-07-11 MED ORDER — ALBUMIN HUMAN 5 % IV SOLN
INTRAVENOUS | Status: DC | PRN
  Administered 2016-07-11: 18:00:00 via INTRAVENOUS

## 2016-07-11 MED ORDER — FENTANYL CITRATE (PF) 100 MCG/2ML IJ SOLN
INTRAMUSCULAR | Status: DC | PRN
  Administered 2016-07-11 (×2): 25 ug via INTRAVENOUS

## 2016-07-11 MED ORDER — PHENYLEPHRINE HCL 10 MG/ML IJ SOLN
INTRAMUSCULAR | Status: DC | PRN
  Administered 2016-07-11: 75 ug/min via INTRAVENOUS

## 2016-07-11 MED ORDER — LIDOCAINE HCL 2 % IJ SOLN
INTRAMUSCULAR | Status: AC
  Filled 2016-07-11: qty 20

## 2016-07-11 SURGICAL SUPPLY — 56 items
BANDAGE ACE 4X5 VEL STRL LF (GAUZE/BANDAGES/DRESSINGS) ×3 IMPLANT
BLADE SAW SGTL 83.5X18.5 (BLADE) ×6 IMPLANT
BLADE SURG 10 STRL SS (BLADE) ×6 IMPLANT
BLADE SURG 15 STRL LF DISP TIS (BLADE) ×3 IMPLANT
BLADE SURG 15 STRL SS (BLADE) ×6
BNDG CMPR 9X4 STRL LF SNTH (GAUZE/BANDAGES/DRESSINGS) ×1
BNDG COHESIVE 6X5 TAN STRL LF (GAUZE/BANDAGES/DRESSINGS) IMPLANT
BNDG ESMARK 4X9 LF (GAUZE/BANDAGES/DRESSINGS) ×3 IMPLANT
BNDG GAUZE ELAST 4 BULKY (GAUZE/BANDAGES/DRESSINGS) ×3 IMPLANT
BOWL SMART MIX CTS (DISPOSABLE) IMPLANT
CHLORAPREP W/TINT 26ML (MISCELLANEOUS) ×3 IMPLANT
COVER SURGICAL LIGHT HANDLE (MISCELLANEOUS) ×3 IMPLANT
CUFF TOURNIQUET SINGLE 18IN (TOURNIQUET CUFF) ×3 IMPLANT
CUFF TOURNIQUET SINGLE 34IN LL (TOURNIQUET CUFF) ×3 IMPLANT
DRAPE C-ARM MINI 42X72 WSTRAPS (DRAPES) ×3 IMPLANT
DRAPE OEC MINIVIEW 54X84 (DRAPES) ×3 IMPLANT
DRAPE U-SHAPE 47X51 STRL (DRAPES) ×3 IMPLANT
DRSG PAD ABDOMINAL 8X10 ST (GAUZE/BANDAGES/DRESSINGS) ×3 IMPLANT
DRSG VAC ATS SM SENSATRAC (GAUZE/BANDAGES/DRESSINGS) ×3 IMPLANT
ELECT CAUTERY BLADE 6.4 (BLADE) ×3 IMPLANT
ELECT REM PT RETURN 9FT ADLT (ELECTROSURGICAL)
ELECTRODE REM PT RTRN 9FT ADLT (ELECTROSURGICAL) IMPLANT
GAUZE SPONGE 4X4 12PLY STRL (GAUZE/BANDAGES/DRESSINGS) ×3 IMPLANT
GAUZE XEROFORM 1X8 LF (GAUZE/BANDAGES/DRESSINGS) ×3 IMPLANT
GLOVE BIO SURGEON STRL SZ8 (GLOVE) ×3 IMPLANT
GLOVE BIOGEL PI IND STRL 8 (GLOVE) ×1 IMPLANT
GLOVE BIOGEL PI INDICATOR 8 (GLOVE) ×2
GOWN STRL REUS W/ TWL LRG LVL3 (GOWN DISPOSABLE) ×2 IMPLANT
GOWN STRL REUS W/TWL LRG LVL3 (GOWN DISPOSABLE) ×6
HANDPIECE INTERPULSE COAX TIP (DISPOSABLE)
KIT BASIN OR (CUSTOM PROCEDURE TRAY) ×3 IMPLANT
KIT PREVENA INCISION MGT 13 (CANNISTER) ×3 IMPLANT
KIT ROOM TURNOVER OR (KITS) ×3 IMPLANT
MANIFOLD NEPTUNE II (INSTRUMENTS) ×3 IMPLANT
NEEDLE HYPO 25GX1X1/2 BEV (NEEDLE) ×3 IMPLANT
NS IRRIG 1000ML POUR BTL (IV SOLUTION) ×3 IMPLANT
PACK ORTHO EXTREMITY (CUSTOM PROCEDURE TRAY) ×3 IMPLANT
PAD ARMBOARD 7.5X6 YLW CONV (MISCELLANEOUS) ×6 IMPLANT
PAD CAST 4YDX4 CTTN HI CHSV (CAST SUPPLIES) ×1 IMPLANT
PADDING CAST COTTON 4X4 STRL (CAST SUPPLIES) ×3
PADDING CAST COTTON 6X4 STRL (CAST SUPPLIES) ×3 IMPLANT
SCRUB BETADINE 4OZ XXX (MISCELLANEOUS) ×3 IMPLANT
SET HNDPC FAN SPRY TIP SCT (DISPOSABLE) IMPLANT
SOL PREP POV-IOD 4OZ 10% (MISCELLANEOUS) ×3 IMPLANT
STAPLER SKIN PROX WIDE 3.9 (STAPLE) ×3 IMPLANT
STAPLER VISISTAT 35W (STAPLE) ×3 IMPLANT
STOCKINETTE IMPERVIOUS 9X36 MD (GAUZE/BANDAGES/DRESSINGS) ×3 IMPLANT
SUT PROLENE 3 0 PS 2 (SUTURE) ×6 IMPLANT
SWAB CULTURE LIQ STUART DBL (MISCELLANEOUS) ×3 IMPLANT
SWAB CULTURE LIQUID MINI MALE (MISCELLANEOUS) ×3 IMPLANT
SYR CONTROL 10ML LL (SYRINGE) ×3 IMPLANT
TOWEL OR 17X24 6PK STRL BLUE (TOWEL DISPOSABLE) ×3 IMPLANT
TOWEL OR 17X26 10 PK STRL BLUE (TOWEL DISPOSABLE) ×3 IMPLANT
TUBE CONNECTING 12'X1/4 (SUCTIONS) ×1
TUBE CONNECTING 12X1/4 (SUCTIONS) ×2 IMPLANT
YANKAUER SUCT BULB TIP NO VENT (SUCTIONS) ×3 IMPLANT

## 2016-07-11 NOTE — Progress Notes (Signed)
Patient surgery site still bleeding after reinforcing ABD pads. Vascular Surgery informed. Verbalize he will take a look at it. No orders at this time. Will continue to monitor

## 2016-07-11 NOTE — Progress Notes (Signed)
Pharmacy Antibiotic Note  Cory Weber is a 66 y.o. male with history of toe amputation a few weeks ago admitted on 07/08/2016 with drainage from wound on foot.  S/p revision of right transmetatarsal amputation on 07/10/16 with post-op bleeding.  May go back to the [facility] today per RN.  Pharmacy has been consulted for vancomycin and Zosyn dosing for osteomyelitis.  Patient's renal function is stable.  He is afebrile and his WBC trended up post-op.   Plan: - Continue vanc 1000mg  IV Q12H - Zosyn 3.375gm IV Q8H, 4 hr infusion - Monitor renal fxn, clinical progress, vanc trough soon if still on therapy - Consider holding Lovenox and Plavix - F/U abx LOT   Height: 6\' 1"  (185.4 cm) Weight: 190 lb (86.2 kg) IBW/kg (Calculated) : 79.9  Temp (24hrs), Avg:98.2 F (36.8 C), Min:97.7 F (36.5 C), Max:99 F (37.2 C)   Recent Labs Lab 07/08/16 1505 07/08/16 1521 07/09/16 0531 07/10/16 0607 07/11/16 0651  WBC 9.8  --  9.4 9.9 13.2*  CREATININE 1.09  --  1.14 1.06  --   LATICACIDVEN  --  1.31  --   --   --     Estimated Creatinine Clearance: 80.6 mL/min (by C-G formula based on SCr of 1.06 mg/dL).    Allergies  Allergen Reactions  . No Known Allergies    Vanc 5/28 >> Zosyn 5/28 >>  5/28 BCx - NGTD 5/29 surgical PCR - MSSA+   Cory Weber, PharmD, BCPS Pager:  [phone] 07/11/2016, 8:05 AM

## 2016-07-11 NOTE — Consult Note (Signed)
   REASON FOR CONSULT: RT foot amputation stump  HPI: 66 year old male with known peripheral vascular disease, diabetes, BPH, CAD, and history of left below-knee amputation presents with an open amputation stump to the right foot. Patient's undergone surgical amputation revascularization by Dr. Servando Snare, [place]. Podiatry was consulted for an opinion regarding delayed primary closure of the amputation stump right foot  versus secondary healing with application of wound VAC.   Physical Exam: General: The patient is alert and oriented x3 in no acute distress.  Dermatology: Open amputation stump noted to the right foot. There is a healthy viable amount of soft tissue. Without probing the amputation stump there appears to be no apparent exposed bone. Wound margins appear healthy and viable. No significant malodor noted.  Vascular: Active bleeding noted to the amputation stump site this morning which is a good sign given his history of peripheral vascular disease and ischemia.  Neurological: Epicritic and protective threshold  diminished bilaterally.   Musculoskeletal Exam:  history of left below-knee amputation. Open transmetatarsal amputation stump right foot    Assessment: 1.  H/o BKA left lower extremity 2. Transmetatarsal and rotation right foot open   Plan of Care:  1. Patient was evaluated. 2.  patient would likely benefit from primary closure of the amputation stump. Soft tissue appears healthy and viable with a good chance of healing. Patient scheduled for surgical intervention. All patient questions were answered. Preoperative orders placed today.  3. Nothing by mouth after breakfast  4. I spoke with Dr. Donzetta Matters regarding surgery. Dr. Donzetta Matters agrees.  5. Follow-up in office 1 week postop     Edrick Kins, DPM [facility]  Dr. Edrick Kins, [address]                                        [place], [place] [address]                  Office [phone]  Fax [phone]

## 2016-07-11 NOTE — Anesthesia Postprocedure Evaluation (Signed)
Anesthesia Post Note  Patient: Cory Weber  Procedure(s) Performed: Procedure(s) (LRB): DELAY PRIMARY CLOSURE FOOT AMPUTATION (Right)     Patient location during evaluation: PACU Anesthesia Type: General Level of consciousness: awake and alert Pain management: pain level controlled Vital Signs Assessment: post-procedure vital signs reviewed and stable Respiratory status: spontaneous breathing, nonlabored ventilation and respiratory function stable Cardiovascular status: blood pressure returned to baseline and stable Postop Assessment: no signs of nausea or vomiting Anesthetic complications: no    Last Vitals:  Vitals:   07/11/16 1915 07/11/16 1930  BP: 114/61 108/64  Pulse: 95 94  Resp: 13 14  Temp:      Last Pain:  Vitals:   07/11/16 1845  TempSrc:   PainSc: Asleep                 Marrio Scribner,W. EDMOND

## 2016-07-11 NOTE — Progress Notes (Signed)
PROGRESS NOTE    Cory Weber  NWG:[id number] DOB: September 13, 1950 DOA: 07/08/2016 PCP: Iona Beard, MD    Brief Narrative:  Cory Weber is a 66 y.o. male with medical history significant of asthma, BPH, CAD, history of CVA, hepatitis C, hyperlipidemia, hypertension, PVD with left BKA, diabetes, and recent hospitalization 05/31/16-06/04/16 for treatment of ischemia of right foot with gangrene, status post arthrectomy and balloon angioplasty of right SFA with stent, discharged on aspirin and Plavix, who was admitted 06/10/16 for severe right foot pain, was subsequently evaluated by vascular surgery and ultimately underwent a right transmetatarsal amputation on 06/13/16.  He was discharged to [facility] on 5/7, but reports his foot hasn't improved. He was admitted for evaluation of the wound infection. Vascular surgery consulted, MRi of the foot ordered and possible debridement in the near future.   Assessment & Plan:   Diabetic Right foot Wound -continue Vanc/Zosyn Day4, stop in 1-2days -pain control -VVS Dr.Cain consulting, s/p recent revascularization of right sfa with stenting and subsequent tma, now with breakdown -s/p debridement of R TMA by Dr.Cain 5/30 -plan for repeat OR by VVS, profuse bleeding from wound-hold lovenox and Plavix today -monitor Hb  Hypertension:  - well controlled on home meds  Anemia due to blood loss and chronic disease -received two units prbc last hospitalization earlier this month -s/p 2 units PRBC his admission, monitor Hb  Diabetes Mellitus:  -continue Lantus, SSI -novolog meal coverage  H/o Chronic diastolic heart failure:  Appears to be compensated.  -diuretics on hold  DVT prophylaxis: (Lovenox/) Code Status: (Full) Family Communication: none at bedside.  Disposition Plan:pending further eval by vascular.    Consultants:   Vascular surgery.   Procedure Performed: Revision of right transmetatarsal amputation 5/30  Dr.Cain   Antimicrobials: vancomycin and zosyn from 5/28   Subjective: -feels ok, waiting for OR  Objective: Vitals:   07/10/16 1715 07/10/16 1821 07/11/16 0100 07/11/16 0646  BP:  (!) 163/85 (!) 137/92 138/80  Pulse: (!) 103 95 95 90  Resp: 16 16  18   Temp: 97.7 F (36.5 C) 97.7 F (36.5 C) 99 F (37.2 C) 98.7 F (37.1 C)  TempSrc:   Oral Oral  SpO2: 99% 98% 98% 98%  Weight:      Height:        Intake/Output Summary (Last 24 hours) at 07/11/16 1308 Last data filed at 07/11/16 0423  Gross per 24 hour  Intake          1879.83 ml  Output              100 ml  Net          1779.83 ml   Filed Weights   07/08/16 1424  Weight: 86.2 kg (190 lb)    Examination:  General exam: AAOx3, no distress  Respiratory system: CTAB, no rales Cardiovascular system: S1 & S2 heard, RRR. No JVD, murmurs, rubs, gallops or clicks. No pedal edema. Gastrointestinal system: Abdomen is nondistended, soft and nontender. No organomegaly or masses felt. Normal bowel sounds heard. Central nervous system: sleeping, full neuro exam couldn't be done. . Extremities: right foot s/p transmetatarsal amp , foul smelling purulent discharge from the foot. Bandaged. Its soaked with discharge. Left BKA.   Skin: see above     Data Reviewed: I have personally reviewed following labs and imaging studies  CBC:  Recent Labs Lab 07/08/16 1505 07/09/16 0531 07/10/16 0607 07/11/16 0651  WBC 9.8 9.4 9.9 13.2*  NEUTROABS 6.3  --   --   --  HGB 7.4* 6.6* 8.5* 8.3*  HCT 24.8* 22.3* 27.2* 26.8*  MCV 85.8 85.8 86.3 87.3  PLT 475* 447* 412* 417   Basic Metabolic Panel:  Recent Labs Lab 07/08/16 1505 07/09/16 0531 07/10/16 0607 07/11/16 0651  NA 133* 135 133* 134*  K 4.2 4.2 4.5 4.3  CL 97* 99* 101 100*  CO2 28 27 27 25   GLUCOSE 74 102* 158* 128*  BUN 12 13 14 10   CREATININE 1.09 1.14 1.06 1.20  CALCIUM 9.2 8.8* 8.8* 9.0   GFR: Estimated Creatinine Clearance: 71.2 mL/min (by C-G formula based  on SCr of 1.2 mg/dL). Liver Function Tests:  Recent Labs Lab 07/08/16 1505  AST 16  ALT 11*  ALKPHOS 69  BILITOT 0.2*  PROT 8.8*  ALBUMIN 3.1*   No results for input(s): LIPASE, AMYLASE in the last 168 hours. No results for input(s): AMMONIA in the last 168 hours. Coagulation Profile: No results for input(s): INR, PROTIME in the last 168 hours. Cardiac Enzymes: No results for input(s): CKTOTAL, CKMB, CKMBINDEX, TROPONINI in the last 168 hours. BNP (last 3 results) No results for input(s): PROBNP in the last 8760 hours. HbA1C: No results for input(s): HGBA1C in the last 72 hours. CBG:  Recent Labs Lab 07/10/16 1639 07/10/16 1813 07/10/16 2120 07/11/16 0645 07/11/16 1230  GLUCAP 95 95 141* 135* 148*   Lipid Profile: No results for input(s): CHOL, HDL, LDLCALC, TRIG, CHOLHDL, LDLDIRECT in the last 72 hours. Thyroid Function Tests: No results for input(s): TSH, T4TOTAL, FREET4, T3FREE, THYROIDAB in the last 72 hours. Anemia Panel: No results for input(s): VITAMINB12, FOLATE, FERRITIN, TIBC, IRON, RETICCTPCT in the last 72 hours. Sepsis Labs:  Recent Labs Lab 07/08/16 1521  LATICACIDVEN 1.31    Recent Results (from the past 240 hour(s))  Blood Cultures x 2 sites     Status: None (Preliminary result)   Collection Time: 07/08/16 10:33 PM  Result Value Ref Range Status   Specimen Description BLOOD RIGHT HAND  Final   Special Requests IN PEDIATRIC BOTTLE Blood Culture adequate volume  Final   Culture NO GROWTH 2 DAYS  Final   Report Status PENDING  Incomplete  Blood Cultures x 2 sites     Status: None (Preliminary result)   Collection Time: 07/08/16 10:33 PM  Result Value Ref Range Status   Specimen Description BLOOD LEFT HAND  Final   Special Requests IN PEDIATRIC BOTTLE Blood Culture adequate volume  Final   Culture NO GROWTH 2 DAYS  Final   Report Status PENDING  Incomplete  Surgical PCR screen     Status: Abnormal   Collection Time: 07/09/16  8:52 AM   Result Value Ref Range Status   MRSA, PCR NEGATIVE NEGATIVE Final   Staphylococcus aureus POSITIVE (A) NEGATIVE Final    Comment:        The Xpert SA Assay (FDA approved for NASAL specimens in patients over 91 years of age), is one component of a comprehensive surveillance program.  Test performance has been validated by [facility] for patients greater than or equal to 41 year old. It is not intended to diagnose infection nor to guide or monitor treatment.          Radiology Studies: No results found.      Scheduled Meds: . allopurinol  200 mg Oral QPC supper  . aspirin  325 mg Oral Daily  . carvedilol  3.125 mg Oral BID WC  . feeding supplement (GLUCERNA SHAKE)  237 mL Oral BID BM  .  feeding supplement (PRO-STAT SUGAR FREE 64)  30 mL Oral Daily  . ferrous sulfate  325 mg Oral TID WC  . gabapentin  600 mg Oral BID   And  . gabapentin  1,200 mg Oral QHS  . insulin aspart  0-15 Units Subcutaneous TID WC  . insulin aspart  3 Units Subcutaneous TID WC  . insulin glargine  25 Units Subcutaneous QHS  . lisinopril  5 mg Oral Daily  . morphine  15 mg Oral Q12H  . mupirocin ointment   Nasal BID  . polyethylene glycol  17 g Oral Daily  . senna-docusate  1 tablet Oral BID  . simvastatin  20 mg Oral Daily  . tamsulosin  0.4 mg Oral Daily   Continuous Infusions: . lactated ringers 10 mL/hr at 07/10/16 1336  . piperacillin-tazobactam (ZOSYN)  IV 3.375 g (07/11/16 1056)  . vancomycin Stopped (07/11/16 1042)     LOS: 3 days    Time spent: 35 minutes.     Domenic Polite, MD [facility] Pager [phone]   If 7PM-7AM, please contact night-coverage [email or web address] Password [email or web address] 07/11/2016, 1:08 PM

## 2016-07-11 NOTE — Transfer of Care (Signed)
Immediate Anesthesia Transfer of Care Note  Patient: Cory Weber  Procedure(s) Performed: Procedure(s): DELAY PRIMARY CLOSURE FOOT AMPUTATION (Right)  Patient Location: PACU  Anesthesia Type:General  Level of Consciousness: awake, alert , oriented and patient cooperative  Airway & Oxygen Therapy: Patient Spontanous Breathing and Patient connected to nasal cannula oxygen  Post-op Assessment: Report given to RN and Post -op Vital signs reviewed and stable  Post vital signs: Reviewed and stable  Last Vitals:  Vitals:   07/11/16 0646 07/11/16 1812  BP: 138/80 99/62  Pulse: 90 (!) 101  Resp: 18 20  Temp: 37.1 C 37.1 C    Last Pain:  Vitals:   07/11/16 1812  TempSrc:   PainSc: (P) Asleep         Complications: No apparent anesthesia complications

## 2016-07-11 NOTE — Anesthesia Procedure Notes (Signed)
Procedure Name: LMA Insertion Date/Time: 07/11/2016 5:08 PM Performed by: Shirlyn Goltz Pre-anesthesia Checklist: Patient identified, Emergency Drugs available, Suction available and Patient being monitored Patient Re-evaluated:Patient Re-evaluated prior to inductionOxygen Delivery Method: Circle system utilized Preoxygenation: Pre-oxygenation with 100% oxygen Intubation Type: IV induction Ventilation: Mask ventilation without difficulty LMA: LMA inserted LMA Size: 5.0 Number of attempts: 1 Placement Confirmation: positive ETCO2 and breath sounds checked- equal and bilateral Tube secured with: Tape Dental Injury: Teeth and Oropharynx as per pre-operative assessment

## 2016-07-11 NOTE — Progress Notes (Signed)
  Progress Note    07/11/2016 3:28 PM 1 Day Post-Op  Subjective:  Having pain in right foot amputation site  Vitals:   07/11/16 0100 07/11/16 0646  BP: (!) 137/92 138/80  Pulse: 95 90  Resp:  18  Temp: 99 F (37.2 C) 98.7 F (37.1 C)    Physical Exam: aaox3 Non labored respirations Right tma dressing cdi  CBC    Component Value Date/Time   WBC 13.2 (H) 07/11/2016 0651   RBC 3.07 (L) 07/11/2016 0651   HGB 8.3 (L) 07/11/2016 0651   HCT 26.8 (L) 07/11/2016 0651   HCT 26.3 (L) 11/26/2015 1440   HCT 32 10/31/2011 1319   PLT 394 07/11/2016 0651   MCV 87.3 07/11/2016 0651   MCV 82.8 10/31/2011 1319   MCH 27.0 07/11/2016 0651   MCHC 31.0 07/11/2016 0651   RDW 16.2 (H) 07/11/2016 0651   LYMPHSABS 2.6 07/08/2016 1505   MONOABS 0.6 07/08/2016 1505   EOSABS 0.3 07/08/2016 1505   BASOSABS 0.0 07/08/2016 1505    BMET    Component Value Date/Time   NA 134 (L) 07/11/2016 0651   NA 134 (A) 12/27/2015   K 4.3 07/11/2016 0651   K 4.0 10/31/2011 1319   CL 100 (L) 07/11/2016 0651   CO2 25 07/11/2016 0651   GLUCOSE 128 (H) 07/11/2016 0651   BUN 10 07/11/2016 0651   BUN 16 12/27/2015   CREATININE 1.20 07/11/2016 0651   CREATININE 1.06 12/22/2015 1551   CALCIUM 9.0 07/11/2016 0651   CALCIUM 9.0 10/31/2011 1319   GFRNONAA >60 07/11/2016 0651   GFRAA >60 07/11/2016 0651    INR    Component Value Date/Time   INR 1.12 06/10/2016 1316     Intake/Output Summary (Last 24 hours) at 07/11/16 1528 Last data filed at 07/11/16 1500  Gross per 24 hour  Intake             2236 ml  Output              100 ml  Net             2136 ml     Assessment:  66 y.o. male is s/p debridement of right tma  Plan: OR today with Dr Amalia Hailey of podiatry. Should have adequate blood flow for healing.  Continue aspirin  Carlin Mamone C. Donzetta Matters, MD [facility] Office: [phone] Pager: [phone]  07/11/2016 3:28 PM

## 2016-07-11 NOTE — Progress Notes (Addendum)
Vascular Surgery MD at Bedside.

## 2016-07-11 NOTE — Progress Notes (Signed)
[facility]  Subjective  - called to see pt for oozing from right TMA   Objective (!) 137/92 95 99 F (37.2 C) (Oral) 16 98%  Intake/Output Summary (Last 24 hours) at 07/11/16 0312 Last data filed at 07/10/16 2310  Gross per 24 hour  Intake             1440 ml  Output              650 ml  Net              790 ml   Dressing saturated with blood removed. Right TMA skin edge oozing from 6 o clock position remainder of TMA is poorly perfused with no bleeding  Assessment/Planning: Direct pressure held on skin edge bleeding for 5 min Redressed with 4 x 4 ACE Dr Donzetta Matters will reassess later today CBC later today  Ruta Hinds 07/11/2016 3:12 AM --  Laboratory Lab Results:  Recent Labs  07/09/16 0531 07/10/16 0607  WBC 9.4 9.9  HGB 6.6* 8.5*  HCT 22.3* 27.2*  PLT 447* 412*   BMET  Recent Labs  07/09/16 0531 07/10/16 0607  NA 135 133*  K 4.2 4.5  CL 99* 101  CO2 27 27  GLUCOSE 102* 158*  BUN 13 14  CREATININE 1.14 1.06  CALCIUM 8.8* 8.8*    COAG Lab Results  Component Value Date   INR 1.12 06/10/2016   INR 1.17 05/31/2016   INR 1.2 (H) 10/19/2015   No results found for: PTT

## 2016-07-11 NOTE — Anesthesia Preprocedure Evaluation (Signed)
Anesthesia Evaluation  Patient identified by MRN, date of birth, ID band Patient awake    Reviewed: Allergy & Precautions, NPO status , Patient's Chart, lab work & pertinent test results, reviewed documented beta blocker date and time   History of Anesthesia Complications Negative for: history of anesthetic complications  Airway Mallampati: II  TM Distance: >3 FB Neck ROM: Full    Dental  (+) Edentulous Upper, Edentulous Lower, Dental Advisory Given   Pulmonary former smoker,    Pulmonary exam normal        Cardiovascular hypertension, Pt. on medications and Pt. on home beta blockers + Peripheral Vascular Disease  Normal cardiovascular exam  ECG: SR, rate 91 ECHO:  - Left ventricle: The cavity size was normal. There was mild hypertrophy of the septum with moderate hypertrophy of the posterior wall. Systolic function was normal. The estimated ejection fraction was in the range of 50% to 55%. Wall motion was normal; there were no regional wall motion abnormalities. Features are consistent with a pseudonormal left ventricular filling pattern, with concomitant abnormal relaxation and increased filling pressure (grade 2 diastolic dysfunction). Doppler parameters are consistent with high ventricular filling   pressure. - Aortic valve: Transvalvular velocity was within the normal range.   There was no stenosis. There was no regurgitation. - Mitral valve: Transvalvular velocity was within the normal range.   There was no evidence for stenosis. There was no regurgitation. - Right ventricle: The cavity size was normal. Wall thickness was   normal. Systolic function was normal. - Tricuspid valve: There was trivial regurgitation. - Pulmonary arteries: Systolic pressure was within the normal   range. PA peak pressure: 34 mm Hg (S).   Neuro/Psych CVA, No Residual Symptoms negative neurological ROS  negative psych ROS   GI/Hepatic negative  GI ROS, (+)     substance abuse  , Hepatitis -, C  Endo/Other  diabetes, Type 2, Insulin Dependent, Oral Hypoglycemic Agents  Renal/GU CRFRenal disease  negative genitourinary   Musculoskeletal negative musculoskeletal ROS (+)   Abdominal   Peds negative pediatric ROS (+)  Hematology  (+) anemia ,   Anesthesia Other Findings Gout Hyperlipidemia  Reproductive/Obstetrics negative OB ROS                             Anesthesia Physical  Anesthesia Plan  ASA: III  Anesthesia Plan: General   Post-op Pain Management:    Induction:   Airway Management Planned: LMA  Additional Equipment:   Intra-op Plan:   Post-operative Plan: Extubation in OR  Informed Consent: I have reviewed the patients History and Physical, chart, labs and discussed the procedure including the risks, benefits and alternatives for the proposed anesthesia with the patient or authorized representative who has indicated his/her understanding and acceptance.   Dental advisory given  Plan Discussed with: CRNA and Surgeon  Anesthesia Plan Comments:         Anesthesia Quick Evaluation

## 2016-07-12 ENCOUNTER — Inpatient Hospital Stay (HOSPITAL_COMMUNITY): Payer: Medicaid Other

## 2016-07-12 ENCOUNTER — Encounter (HOSPITAL_COMMUNITY): Payer: Self-pay | Admitting: Podiatry

## 2016-07-12 LAB — CBC
HEMATOCRIT: 22.8 % — AB (ref 39.0–52.0)
HEMOGLOBIN: 7.2 g/dL — AB (ref 13.0–17.0)
MCH: 27.4 pg (ref 26.0–34.0)
MCHC: 31.6 g/dL (ref 30.0–36.0)
MCV: 86.7 fL (ref 78.0–100.0)
Platelets: 309 10*3/uL (ref 150–400)
RBC: 2.63 MIL/uL — ABNORMAL LOW (ref 4.22–5.81)
RDW: 15.5 % (ref 11.5–15.5)
WBC: 13.2 10*3/uL — ABNORMAL HIGH (ref 4.0–10.5)

## 2016-07-12 LAB — VANCOMYCIN, TROUGH: Vancomycin Tr: 24 ug/mL (ref 15–20)

## 2016-07-12 LAB — PREPARE RBC (CROSSMATCH)

## 2016-07-12 LAB — GLUCOSE, CAPILLARY
GLUCOSE-CAPILLARY: 116 mg/dL — AB (ref 65–99)
Glucose-Capillary: 146 mg/dL — ABNORMAL HIGH (ref 65–99)
Glucose-Capillary: 211 mg/dL — ABNORMAL HIGH (ref 65–99)
Glucose-Capillary: 237 mg/dL — ABNORMAL HIGH (ref 65–99)

## 2016-07-12 MED ORDER — VANCOMYCIN HCL IN DEXTROSE 1-5 GM/200ML-% IV SOLN
1000.0000 mg | INTRAVENOUS | Status: DC
  Administered 2016-07-13: 1000 mg via INTRAVENOUS
  Filled 2016-07-12 (×2): qty 200

## 2016-07-12 MED ORDER — FUROSEMIDE 10 MG/ML IJ SOLN
20.0000 mg | Freq: Once | INTRAMUSCULAR | Status: AC
  Administered 2016-07-12: 20 mg via INTRAVENOUS
  Filled 2016-07-12: qty 2

## 2016-07-12 MED ORDER — CLOPIDOGREL BISULFATE 75 MG PO TABS
75.0000 mg | ORAL_TABLET | Freq: Every day | ORAL | Status: DC
  Administered 2016-07-12 – 2016-07-18 (×7): 75 mg via ORAL
  Filled 2016-07-12 (×7): qty 1

## 2016-07-12 MED ORDER — SODIUM CHLORIDE 0.9 % IV SOLN: Freq: Once | INTRAVENOUS | Status: DC

## 2016-07-12 NOTE — Progress Notes (Signed)
Patient voided last night around 1950.  Patient has not voided on his one since last night.  Did bladder scan 0300; showed 613 mL.  Patient asymptomatic.  Tried warm compressions and position changes. He still is unable to void on his own.  Tried several times to I/O cath with 2 RN's; no success.  Patient has a hx of BPH and able to feel resistance.  Contacted on call via Amion for an order for coude.  Waiting for response.

## 2016-07-12 NOTE — Progress Notes (Addendum)
PROGRESS NOTE    Cory Weber  ZOX:[id number] DOB: 06-27-1950 DOA: 07/08/2016 PCP: Iona Beard, MD    Brief Narrative:  Cory Weber is a 66 y.o. male with medical history significant of asthma, BPH, CAD, history of CVA, hepatitis C, hyperlipidemia, hypertension, PVD with left BKA, diabetes, and recent hospitalization 05/31/16-06/04/16 for treatment of ischemia of right foot with gangrene, status post arthrectomy and balloon angioplasty of right SFA with stent, discharged on aspirin and Plavix, who was admitted 06/10/16 for severe right foot pain, was subsequently evaluated by vascular surgery and ultimately underwent a right transmetatarsal amputation on 06/13/16.  He was discharged to [facility] on 5/7, but reports his foot hasn't improved. He was admitted for evaluation of the wound infection. Vascular surgery consulted, MRi of the foot ordered and possible debridement in the near future.   Assessment & Plan:   Diabetic Right foot Wound -continue Vanc/Zosyn Day 5, stop tomorrow -pain control -VVS Dr.Cain consulting, s/p recent revascularization of right sfa with stenting and subsequent tma, now with breakdown -s/p debridement of R TMA by Dr.Cain 5/30 -Went back to OR by VVS and Podiatry Dr.Evans and underwent Lisfranc Amputation and wound vac placement -Hb dropped due to blood loss, transfuse  Hypertension:  - well controlled on home meds  Anemia due to blood loss and chronic disease -received two units prbc last hospitalization earlier this month -s/p 2 units PRBC his admission, monitor Hb -will transfuse 1 unit PRBC today  Diabetes Mellitus:  -continue Lantus, SSI -novolog meal coverage, stable  H/o Chronic diastolic heart failure:  Appears to be compensated.  -diuretics on hold  DVT prophylaxis: (Lovenox/) Code Status: (Full) Family Communication: none at bedside.  Disposition Plan:pending further eval by vascular.    Consultants:   Vascular surgery.    Procedure Performed:   5/30 Dr.Cain : Revision of right transmetatarsal amputation   5/31: Procedure:  1. Lisfranc amputation right foot 2. Delayed primary closure right foot 3. Application negative pressure wound VAC   Antimicrobials: vancomycin and zosyn from 5/28   Subjective: -feels ok, some pain at surgical site  Objective: Vitals:   07/11/16 1944 07/11/16 2007 07/11/16 2040 07/12/16 0514  BP: 103/66 113/68 108/60 115/66  Pulse: 95 96 92 (!) 102  Resp: 14  16 17   Temp: 98.6 F (37 C) 98.8 F (37.1 C) 98.6 F (37 C) 98.8 F (37.1 C)  TempSrc:  Oral Oral Oral  SpO2: 94% 99% 98% 98%  Weight:      Height:        Intake/Output Summary (Last 24 hours) at 07/12/16 1426 Last data filed at 07/12/16 1000  Gross per 24 hour  Intake           2494.5 ml  Output              750 ml  Net           1744.5 ml   Filed Weights   07/08/16 1424  Weight: 86.2 kg (190 lb)    Examination:  General exam: AAOx3, no distress  Respiratory system: CTAB, no rales Cardiovascular system: S1 & S2 heard, RRR. No JVD, murmurs, rubs, gallops or clicks. No pedal edema. Gastrointestinal system: Abdomen is nondistended, soft and nontender. No organomegaly or masses felt. Normal bowel sounds heard. Central nervous system: sleeping, full neuro exam couldn't be done. . Extremities: right foot with mid foot amputation, dressing, vac Left BKA.   Skin: see above     Data Reviewed: I have  personally reviewed following labs and imaging studies  CBC:  Recent Labs Lab 07/08/16 1505 07/09/16 0531 07/10/16 0607 07/11/16 0651 07/11/16 2251 07/12/16 0659  WBC 9.8 9.4 9.9 13.2* 9.8 13.2*  NEUTROABS 6.3  --   --   --   --   --   HGB 7.4* 6.6* 8.5* 8.3* 7.0* 7.2*  HCT 24.8* 22.3* 27.2* 26.8* 22.6* 22.8*  MCV 85.8 85.8 86.3 87.3 86.9 86.7  PLT 475* 447* 412* 394 297 161   Basic Metabolic Panel:  Recent Labs Lab 07/08/16 1505 07/09/16 0531 07/10/16 0607 07/11/16 0651  NA 133*  135 133* 134*  K 4.2 4.2 4.5 4.3  CL 97* 99* 101 100*  CO2 28 27 27 25   GLUCOSE 74 102* 158* 128*  BUN 12 13 14 10   CREATININE 1.09 1.14 1.06 1.20  CALCIUM 9.2 8.8* 8.8* 9.0   GFR: Estimated Creatinine Clearance: 71.2 mL/min (by C-G formula based on SCr of 1.2 mg/dL). Liver Function Tests:  Recent Labs Lab 07/08/16 1505  AST 16  ALT 11*  ALKPHOS 69  BILITOT 0.2*  PROT 8.8*  ALBUMIN 3.1*   No results for input(s): LIPASE, AMYLASE in the last 168 hours. No results for input(s): AMMONIA in the last 168 hours. Coagulation Profile: No results for input(s): INR, PROTIME in the last 168 hours. Cardiac Enzymes: No results for input(s): CKTOTAL, CKMB, CKMBINDEX, TROPONINI in the last 168 hours. BNP (last 3 results) No results for input(s): PROBNP in the last 8760 hours. HbA1C: No results for input(s): HGBA1C in the last 72 hours. CBG:  Recent Labs Lab 07/11/16 1519 07/11/16 1813 07/11/16 2233 07/12/16 0644 07/12/16 1202  GLUCAP 103* 100* 187* 116* 146*   Lipid Profile: No results for input(s): CHOL, HDL, LDLCALC, TRIG, CHOLHDL, LDLDIRECT in the last 72 hours. Thyroid Function Tests: No results for input(s): TSH, T4TOTAL, FREET4, T3FREE, THYROIDAB in the last 72 hours. Anemia Panel: No results for input(s): VITAMINB12, FOLATE, FERRITIN, TIBC, IRON, RETICCTPCT in the last 72 hours. Sepsis Labs:  Recent Labs Lab 07/08/16 1521  LATICACIDVEN 1.31    Recent Results (from the past 240 hour(s))  Blood Cultures x 2 sites     Status: None (Preliminary result)   Collection Time: 07/08/16 10:33 PM  Result Value Ref Range Status   Specimen Description BLOOD RIGHT HAND  Final   Special Requests IN PEDIATRIC BOTTLE Blood Culture adequate volume  Final   Culture NO GROWTH 4 DAYS  Final   Report Status PENDING  Incomplete  Blood Cultures x 2 sites     Status: None (Preliminary result)   Collection Time: 07/08/16 10:33 PM  Result Value Ref Range Status   Specimen Description  BLOOD LEFT HAND  Final   Special Requests IN PEDIATRIC BOTTLE Blood Culture adequate volume  Final   Culture NO GROWTH 4 DAYS  Final   Report Status PENDING  Incomplete  Surgical PCR screen     Status: Abnormal   Collection Time: 07/09/16  8:52 AM  Result Value Ref Range Status   MRSA, PCR NEGATIVE NEGATIVE Final   Staphylococcus aureus POSITIVE (A) NEGATIVE Final    Comment:        The Xpert SA Assay (FDA approved for NASAL specimens in patients over 71 years of age), is one component of a comprehensive surveillance program.  Test performance has been validated by [facility] for patients greater than or equal to 63 year old. It is not intended to diagnose infection nor to guide or  monitor treatment.          Radiology Studies: Dg Foot Complete Right  Result Date: 07/12/2016 CLINICAL DATA:  Status post amputation.  Diabetes mellitus. EXAM: RIGHT FOOT COMPLETE - 3+ VIEW COMPARISON:  Jul 08, 2016 FINDINGS: , oblique, and lateral views were obtained. There has been interval amputation of the remaining proximal metatarsals. No bone is seen distal to the distal tarsal row. There is soft tissue air within the stump consistent with recent surgery. There is no fracture or dislocation. No bony destruction is seen currently. Areas of arterial vascular calcification consistent with known diabetes mellitus. IMPRESSION: There has now been removal of remaining proximal metatarsals. Soft tissue air in the stomach is felt to be of postoperative etiology. No fracture or dislocation in remaining bones. No erosive change or bony destruction evident currently. Extensive arterial vascular calcification is consistent with diabetes mellitus. Electronically Signed   By: Lowella Grip III M.D.   On: 07/12/2016 10:06        Scheduled Meds: . allopurinol  200 mg Oral QPC supper  . aspirin  325 mg Oral Daily  . carvedilol  3.125 mg Oral BID WC  . clopidogrel  75 mg Oral Daily  . feeding supplement  (GLUCERNA SHAKE)  237 mL Oral BID BM  . feeding supplement (PRO-STAT SUGAR FREE 64)  30 mL Oral Daily  . ferrous sulfate  325 mg Oral TID WC  . gabapentin  600 mg Oral BID   And  . gabapentin  1,200 mg Oral QHS  . insulin aspart  0-15 Units Subcutaneous TID WC  . insulin aspart  3 Units Subcutaneous TID WC  . insulin glargine  25 Units Subcutaneous QHS  . lisinopril  5 mg Oral Daily  . morphine  15 mg Oral Q12H  . mupirocin ointment   Nasal BID  . polyethylene glycol  17 g Oral Daily  . senna-docusate  1 tablet Oral BID  . simvastatin  20 mg Oral Daily  . tamsulosin  0.4 mg Oral Daily   Continuous Infusions: . sodium chloride    . piperacillin-tazobactam (ZOSYN)  IV Stopped (07/12/16 0748)  . [START ON 07/13/2016] vancomycin       LOS: 4 days    Time spent: 35 minutes.     Domenic Polite, MD [facility] Pager [phone]   If 7PM-7AM, please contact night-coverage [email or web address] Password [email or web address] 07/12/2016, 2:26 PM

## 2016-07-12 NOTE — Progress Notes (Signed)
At 0340 another staff member was kind enough to give patient Oxy IR 10 mg PO for pain at his request.  Patient took meds and then stated that he normally gets 15 mg of Oxy IR but nurse was unable to pull the extra 5 mg at the pyxis to give.  Patient became irate and upset.  Unable to offer other pain meds at this time because they are not due.  Encourage patient to keep rt foot elevated and not have it hanging off of the bed as he has been doing this all night.

## 2016-07-12 NOTE — Clinical Social Work Note (Signed)
Clinical Social Work Assessment  Patient Details  Name: Cory Weber MRN: [id number] Date of Birth: 02/14/1950  Date of referral:  07/12/16               Reason for consult:  Facility Placement                Permission sought to share information with:  Facility Contact Representative Permission granted to share information::  Yes, Verbal Permission Granted  Name::     Jackie  Agency::  SNF  Relationship::  sister  Contact Information:     Housing/Transportation Living arrangements for the past 2 months:  Apartment Source of Information:  Patient Patient Interpreter Needed:  None Criminal Activity/Legal Involvement Pertinent to Current Situation/Hospitalization:  No - Comment as needed Significant Relationships:  Siblings, Other Family Members Lives with:  Self Do you feel safe going back to the place where you live?  No Need for family participation in patient care:  No (Coment)  Care giving concerns:  Patient returned from [facility] due to medical need. Patient will return back to [facility] as he is unsafe to return home at this time and needs continued rehab.  Social Worker assessment / plan:  CSW met with patient at bedside to discuss Dc pln to SNF. CSW explained her role. Patient familiar with SNF placement and is amenable to return to [facility]. CSW obtained permission to send out offers. CSW completed FL2, obtained passr.  Employment status:  Disabled (Comment on whether or not currently receiving Disability) Insurance information:  Other (Comment Required) (Medicaid) PT Recommendations:  Skilled Nursing Facility Information / Referral to community resources:  Skilled Nursing Facility  Patient/Family's Response to care:  Patient appreciative of CSW assistance with SNF placement. He reports no issues or concerns.  Patient/Family's Understanding of and Emotional Response to Diagnosis, Current Treatment, and Prognosis:  Patient has good understanding of diagnosis, current  treatment and prognosis. Patient hopeful that he will improve with rehabilitation. No issues or concerns at this time.  Emotional Assessment Appearance:  Appears stated age Attitude/Demeanor/Rapport:   (Cooperative) Affect (typically observed):    Orientation:  Oriented to Self, Oriented to Place, Oriented to  Time, Oriented to Situation Alcohol / Substance use:  Not Applicable Psych involvement (Current and /or in the community):  No (Comment)  Discharge Needs  Concerns to be addressed:  Care Coordination Readmission within the last 30 days:  No Current discharge risk:  Dependent with Mobility, Physical Impairment Barriers to Discharge:  No Barriers Identified   Patricia V Pencil, LCSW 07/12/2016, 3:28 PM  

## 2016-07-12 NOTE — Brief Op Note (Signed)
07/08/2016 - 07/11/2016  7:45 AM  PATIENT:  Cory Weber  66 y.o. male  PRE-OPERATIVE DIAGNOSIS:  right foot amp  POST-OPERATIVE DIAGNOSIS:  right foot amp  PROCEDURE:  Procedure(s): DELAY PRIMARY CLOSURE FOOT AMPUTATION (Right)  SURGEON:  Surgeon(s) and Role:    Waynetta Sandy, MD - Assisting    Edrick Kins, DPM - Primary  PHYSICIAN ASSISTANT:   ASSISTANTS: none   ANESTHESIA:   local  EBL:  No intake/output data recorded.  BLOOD ADMINISTERED:none  DRAINS: none   LOCAL MEDICATIONS USED:  MARCAINE    and LIDOCAINE   SPECIMEN:  No Specimen  DISPOSITION OF SPECIMEN:  N/A  COUNTS:  YES  TOURNIQUET:  * No tourniquets in log *  DICTATION: .Dragon Dictation  PLAN OF CARE: Admit to inpatient   PATIENT DISPOSITION:  PACU - hemodynamically stable.   Delay start of Pharmacological VTE agent (>24hrs) due to surgical blood loss or risk of bleeding: yes

## 2016-07-12 NOTE — Op Note (Signed)
OPERATIVE REPORT Patient name: Cory Weber MRN: [id number] DOB: 30-Jul-1950  DOS: 07/11/2016  Preop Dx: Open right foot amputation stump. Postop Dx: same  Procedure:  1. Lisfranc amputation right foot 2. Delayed primary closure right foot 3. Application negative pressure wound VAC  Surgeon: Edrick Kins DPM  Anesthesia: 50-50 mixture of 2% lidocaine plain with 0.5% Marcaine plain totaling 20 mL infiltrated in the patient's right lower extremity  Hemostasis: No hemostasis  EBL: 100 mL Materials: None Injectables: None Pathology: None  Condition: The patient tolerated the procedure and anesthesia well. No complications noted or reported   Justification for procedure: The patient is a 66 y.o. male with a history of diabetes mellitus and known peripheral vascular disease who presents today for surgical correction of an open right foot amputation stump. All conservative modalities of been unsuccessful in providing any sort of satisfactory alleviation of symptoms with the patient. The patient was told benefits as well as possible side effects of the surgery. The patient consented for surgical correction. The patient consent form was reviewed. All patient questions were answered. No guarantees were expressed or implied. The patient and the surgeon boson the patient consent form with the witness present and placed in the patient's chart.   Procedure in Detail: The patient was brought to the operating room, placed in the operating table in the supine position at which time an aseptic scrub and drape were performed about the patient's respective lower extremity after anesthesia was induced as described above. Attention was then directed to the surgical area where procedure number one commenced.  Procedure #1: Lisfranc amputation right foot A fishmouth type of incision was planned made around the right foot amputation stump. The incision was carried down to the level bone care taken  become clinically did retract well small neurovascular structures traversing the incision site. All necrotic soft tissue and redundant soft tissue was sharply dissected using a surgical #10 blade. Careful dissection of all soft tissue was reflected away from the remaining metatarsals 1 through 5 of the right foot. A sagittal blade mounted on a system 7 trauma drill was utilized to create an osteotomy throughout the metatarsals one through 5 of the amputation stump. The osteotomy was performed first to the Lisfranc tarsometatarsal joint. Disarticulation at the level of the tarsometatarsal joint was performed using sharp dissection and all osseous structures distal to the Lisfranc joint were removed in toto. All tendons that were visualized within the amputation stump were distracted distally and cut our proximal screw be visualized.  Procedure #2: Delayed primary closure right foot amputation stump All redundant soft tissue around the amputation stump was sharply dissected and debulking was performed in order to provide better primary closure of the amputation stump. Superficial skin edges were reapproximated using stainless still skin staples. Primary closure was obtained in preparation for application of negative pressure wound VAC therapy to the incision site.  Procedure #3: Application of negative pressure wound VAC right foot KCI Provena negative pressure wound VAC was applied to the incision site. The wound VAC was applied in the standard manner. It was then reinforced with film barrier.    Dry sterile compressive dressings were then applied to all previously mentioned incision sites about the patient's lower extremity.   The patient was then transferred from the operating room to the recovery room having tolerated the procedure and anesthesia well. All vital signs are stable. After a brief stay in the recovery room the patient was readmitted to his room  with adequate prescriptions for analgesia.  Verbal as well as written instructions were provided for the patient regarding wound care. The patient is to keep the dressings clean dry and intact until they are to follow-up with the surgeon Dr. Servando Snare or myself in the office upon discharge.   Edrick Kins, DPM [facility]  Dr. Edrick Kins, [address]                                        [place], [place] [address]                Office [phone]  Fax [phone]

## 2016-07-12 NOTE — NC FL2 (Signed)
[place] LEVEL OF CARE SCREENING TOOL     IDENTIFICATION  Patient Name: Cory Weber Birthdate: Jun 10, 1950 Sex: male Admission Date (Current Location): 07/08/2016  [facility] and [place] Number:  Herbalist and Address:  [facility], [place] [address], [place], [place] [address]      Provider Number: [id number]  Attending Physician Name and Address:  Domenic Polite, MD  Relative Name and Phone Number:       Current Level of Care: Hospital Recommended Level of Care: [place] Prior Approval Number:    Date Approved/Denied: 07/12/16 PASRR Number: [id number]  Discharge Plan: SNF    Current Diagnoses: Patient Active Problem List   Diagnosis Date Noted  . Osteomyelitis of foot ([place]) 07/08/2016  . S/P transmetatarsal amputation of foot, right ([place]) 06/26/2016  . Constipation due to opioid therapy 06/15/2016  . Physical deconditioning 06/10/2016  . Wet gangrene ([place]) 06/10/2016  . Chronic diastolic CHF (congestive heart failure) ([place]) 06/10/2016  . Ischemic pain of right foot 06/04/2016  . Ischemia of foot 05/31/2016  . 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 Dyslipidemia associated with type 2 diabetes mellitus ([place]) 01/21/2016  . Uncontrolled type II diabetes with peripheral autonomic neuropathy ([place]) 01/17/2016  . Weight gain 12/20/2015  . Edema of right lower extremity 12/20/2015  . Cough 11/26/2015  . GERD (gastroesophageal reflux disease) 10/08/2015  . Amputation of left lower extremity below knee ([place]) 08/21/2015  . Phantom limb pain ([place])   . Anemia of chronic disease   . BPH (benign prostatic hyperplasia)   . ETOH abuse   . PVD (peripheral vascular disease) ([place])   . Chronic hepatitis C without hepatic coma ([place])   . History of CVA (cerebrovascular accident) without residual deficits   . Hyponatremia   . Chronic kidney disease, stage 3  07/18/2015  . Essential hypertension 07/18/2015  . S/P lumbar spinal fusion 11/25/2013  . Melena 12/03/2011  . Hemiparesthesia 02/03/2011  . Polysubstance abuse 02/03/2011  . Tobacco abuse 02/03/2011    Orientation RESPIRATION BLADDER Height & Weight     Self, Time, Situation, Place  Normal Continent Weight: 190 lb (86.2 kg) Height:  6\' 1"  (185.4 cm)  BEHAVIORAL SYMPTOMS/MOOD NEUROLOGICAL BOWEL NUTRITION STATUS      Continent Diet (See DC Summary)  AMBULATORY STATUS COMMUNICATION OF NEEDS Skin   Limited Assist Verbally Surgical wounds (Right Leg Closed Incision; right Leg Closed Incision Compression Wrap)                       Personal Care Assistance Level of Assistance  Bathing, Feeding, Dressing Bathing Assistance: Limited assistance Feeding assistance: Independent Dressing Assistance: Limited assistance     Functional Limitations Info  Sight, Hearing, Speech Sight Info: Adequate Hearing Info: Adequate Speech Info: Adequate    SPECIAL CARE FACTORS FREQUENCY  PT (By licensed PT), OT (By licensed OT)     PT Frequency: 5xweek OT Frequency: 5xweek            Contractures      Additional Factors Info  Code Status, Allergies, Insulin Sliding Scale, Isolation Precautions Code Status Info: Full Allergies Info: NKA   Insulin Sliding Scale Info: 15 units 3x's day; 3 units;25 units at bed Isolation Precautions Info: MRSA     Current Medications (07/12/2016):  This is the current hospital active medication list Current Facility-Administered Medications  Medication Dose Route Frequency Provider  Last Rate Last Dose  . 0.9 %  sodium chloride infusion   Intravenous Once Domenic Polite, MD      . acetaminophen (TYLENOL) tablet 650 mg  650 mg Oral Q6H PRN Karmen Bongo, MD   650 mg at 07/12/16 8101   Or  . acetaminophen (TYLENOL) suppository 650 mg  650 mg Rectal Q6H PRN Karmen Bongo, MD      . allopurinol (ZYLOPRIM) tablet 200 mg  200 mg Oral QPC supper Karmen Bongo, MD   200 mg at 07/11/16 2106  . aspirin tablet 325 mg  325 mg Oral Daily Karmen Bongo, MD   325 mg at 07/12/16 1100  . carvedilol (COREG) tablet 3.125 mg  3.125 mg Oral BID WC Karmen Bongo, MD   3.125 mg at 07/12/16 0900  . clopidogrel (PLAVIX) tablet 75 mg  75 mg Oral Daily Waynetta Sandy, MD   75 mg at 07/12/16 1100  . feeding supplement (GLUCERNA SHAKE) (GLUCERNA SHAKE) liquid 237 mL  237 mL Oral BID BM Hosie Poisson, MD   237 mL at 07/09/16 1753  . feeding supplement (PRO-STAT SUGAR FREE 64) liquid 30 mL  30 mL Oral Daily Hosie Poisson, MD   30 mL at 07/09/16 1753  . ferrous sulfate tablet 325 mg  325 mg Oral TID WC Karmen Bongo, MD   325 mg at 07/12/16 1209  . gabapentin (NEURONTIN) tablet 600 mg  600 mg Oral BID Karmen Bongo, MD   600 mg at 07/12/16 0900   And  . gabapentin (NEURONTIN) tablet 1,200 mg  1,200 mg Oral Ivery Quale, MD   1,200 mg at 07/11/16 2222  . insulin aspart (novoLOG) injection 0-15 Units  0-15 Units Subcutaneous TID WC Karmen Bongo, MD   2 Units at 07/11/16 1232  . insulin aspart (novoLOG) injection 3 Units  3 Units Subcutaneous TID WC Hosie Poisson, MD   3 Units at 07/09/16 1751  . insulin glargine (LANTUS) injection 25 Units  25 Units Subcutaneous QHS Karmen Bongo, MD   25 Units at 07/11/16 2233  . lisinopril (PRINIVIL,ZESTRIL) tablet 5 mg  5 mg Oral Daily Karmen Bongo, MD   5 mg at 07/12/16 1100  . magnesium hydroxide (MILK OF MAGNESIA) suspension 30 mL  30 mL Oral Daily PRN Karmen Bongo, MD      . methocarbamol (ROBAXIN) tablet 500 mg  500 mg Oral Q6H PRN Karmen Bongo, MD   500 mg at 07/12/16 0228  . morphine (MS CONTIN) 12 hr tablet 15 mg  15 mg Oral Q12H Karmen Bongo, MD   15 mg at 07/12/16 1100  . mupirocin ointment (BACTROBAN) 2 %   Nasal BID Hosie Poisson, MD      . ondansetron [facility]) tablet 4 mg  4 mg Oral Q6H PRN Karmen Bongo, MD       Or  . ondansetron [facility]) injection 4 mg  4 mg Intravenous  Q6H PRN Karmen Bongo, MD      . oxyCODONE (Oxy IR/ROXICODONE) immediate release tablet 10-15 mg  10-15 mg Oral Q4H PRN Karmen Bongo, MD   5 mg at 07/12/16 0355  . piperacillin-tazobactam (ZOSYN) IVPB 3.375 g  3.375 g Intravenous Q8H Julianne Rice, MD   Stopped at 07/12/16 [phone]  . polyethylene glycol (MIRALAX / GLYCOLAX) packet 17 g  17 g Oral Daily Karmen Bongo, MD   17 g at 07/12/16 1100  . senna-docusate (Senokot-S) tablet 1 tablet  1 tablet Oral BID Karmen Bongo, MD   1 tablet  at 07/12/16 1100  . simvastatin (ZOCOR) tablet 20 mg  20 mg Oral Daily Karmen Bongo, MD   20 mg at 07/12/16 1100  . tamsulosin (FLOMAX) capsule 0.4 mg  0.4 mg Oral Daily Karmen Bongo, MD   0.4 mg at 07/12/16 1100  . [START ON 07/13/2016] vancomycin (VANCOCIN) IVPB 1000 mg/200 mL premix  1,000 mg Intravenous Q18H Domenic Polite, MD         Discharge Medications: Please see discharge summary for a list of discharge medications.  Relevant Imaging Results:  Relevant Lab Results:   Additional Information [id number]  Normajean Baxter, LCSW

## 2016-07-12 NOTE — Progress Notes (Addendum)
Staff member figured out a to provide the extra 5 mg Oxy IR PO for patient.  Medication received.

## 2016-07-12 NOTE — Progress Notes (Signed)
Lab called this am with a critical lab value vanc trough 24 at 0745.  Contacted Rx; instructed to hold am Vanc until further notice.  Passed information to oncoming nurse.

## 2016-07-12 NOTE — Progress Notes (Signed)
  Progress Note    07/12/2016 8:17 AM 1 Day Post-Op  Subjective:  Having right foot pain this a.m.  Vitals:   07/11/16 2040 07/12/16 0514  BP: 108/60 115/66  Pulse: 92 (!) 102  Resp: 16 17  Temp: 98.6 F (37 C) 98.8 F (37.1 C)    Physical Exam: aaox3 Palpable right popliteal pulse Vac to suction right foot ampuation site  CBC    Component Value Date/Time   WBC 13.2 (H) 07/12/2016 0659   RBC 2.63 (L) 07/12/2016 0659   HGB 7.2 (L) 07/12/2016 0659   HCT 22.8 (L) 07/12/2016 0659   HCT 26.3 (L) 11/26/2015 1440   HCT 32 10/31/2011 1319   PLT 309 07/12/2016 0659   MCV 86.7 07/12/2016 0659   MCV 82.8 10/31/2011 1319   MCH 27.4 07/12/2016 0659   MCHC 31.6 07/12/2016 0659   RDW 15.5 07/12/2016 0659   LYMPHSABS 2.6 07/08/2016 1505   MONOABS 0.6 07/08/2016 1505   EOSABS 0.3 07/08/2016 1505   BASOSABS 0.0 07/08/2016 1505    BMET    Component Value Date/Time   NA 134 (L) 07/11/2016 0651   NA 134 (A) 12/27/2015   K 4.3 07/11/2016 0651   K 4.0 10/31/2011 1319   CL 100 (L) 07/11/2016 0651   CO2 25 07/11/2016 0651   GLUCOSE 128 (H) 07/11/2016 0651   BUN 10 07/11/2016 0651   BUN 16 12/27/2015   CREATININE 1.20 07/11/2016 0651   CREATININE 1.06 12/22/2015 1551   CALCIUM 9.0 07/11/2016 0651   CALCIUM 9.0 10/31/2011 1319   GFRNONAA >60 07/11/2016 0651   GFRAA >60 07/11/2016 0651    INR    Component Value Date/Time   INR 1.12 06/10/2016 1316     Intake/Output Summary (Last 24 hours) at 07/12/16 0817 Last data filed at 07/11/16 2040  Gross per 24 hour  Intake           2374.5 ml  Output              750 ml  Net           1624.5 ml     Assessment:  66 y.o. male is s/p stenting of right sfa, tma with wound breakdown and now lisfranc amputation by Dr. Amalia Hailey  Plan: Asa/plavix Will f/u with Dr. Amalia Hailey in 1 week for wound check and vac removal F/u with me in office in 3-4 weeks   Cory Weber C. Donzetta Matters, MD [facility] Office:  [phone] Pager: [phone]  07/12/2016 8:17 AM

## 2016-07-12 NOTE — Progress Notes (Signed)
Received order for Coude catheter; contacted specific unit to assist with insertion.  Spoke with night charge nurse on [place] - unable to come at this time.  She will pass this information on to oncoming day staff charge nurse.  Placed A-11 order for catheter to material management.  Will inform oncoming nurse on unit.  Patient still remains asymptomatic.

## 2016-07-12 NOTE — Progress Notes (Signed)
Orthopedic Tech Progress Note Patient Details:  Cory Weber 07/01/1950 [id number]  Ortho Devices Type of Ortho Device: CAM walker Ortho Device/Splint Location: rle Ortho Device/Splint Interventions: Application   [name] 07/12/2016, 2:30 PM

## 2016-07-12 NOTE — Progress Notes (Signed)
Pharmacy Antibiotic Note  Cory Weber is a 66 y.o. male with history of toe amputation a few weeks ago admitted on 07/08/2016 with drainage from wound on foot.  S/p revision of right transmetatarsal amputation on 07/10/16 with post-op bleeding.  He has wound vac in place.  Pharmacy has been consulted for vancomycin and Zosyn dosing for osteomyelitis.  Patient's renal function is stable with slight uptrend in creatinine.  He is afebrile and his WBC trended up post-op.  Vanc. Trough = 86mcg/ml and is just slightly above desired goal of 15-64mcg/ml and may indicate inadequate clearance.  Will adjust frequency down some.   Plan: - Change vanc to 1000mg  IV Q18H - Zosyn 3.375gm IV Q8H, 4 hr infusion - Monitor renal fxn, clinical progress, vanc trough after new steady state achieved if renal function is still appropriate - Consider holding Lovenox and Plavix - F/U abx LOT   Height: 6\' 1"  (185.4 cm) Weight: 190 lb (86.2 kg) IBW/kg (Calculated) : 79.9  Temp (24hrs), Avg:98.8 F (37.1 C), Min:98.6 F (37 C), Max:99 F (37.2 C)   Recent Labs Lab 07/08/16 1505 07/08/16 1521 07/09/16 0531 07/10/16 0607 07/11/16 0651 07/11/16 2251 07/12/16 0659  WBC 9.8  --  9.4 9.9 13.2* 9.8 13.2*  CREATININE 1.09  --  1.14 1.06 1.20  --   --   LATICACIDVEN  --  1.31  --   --   --   --   --   VANCOTROUGH  --   --   --   --   --   --  24*    Estimated Creatinine Clearance: 71.2 mL/min (by C-G formula based on SCr of 1.2 mg/dL).    Allergies  Allergen Reactions  . No Known Allergies    Vanc 5/28 >> Zosyn 5/28 >>  5/28 BCx - NGTD 5/29 surgical PCR - MSSA+  Rober Minion, PharmD., MS Clinical Pharmacist Pager:  [phone] Thank you for allowing pharmacy to be part of this patients care team. 07/12/2016, 11:36 AM

## 2016-07-13 LAB — BPAM RBC
Blood Product Expiration Date: 201806212359
Blood Product Expiration Date: 201806212359
Blood Product Expiration Date: 201806252359
Blood Product Expiration Date: 201806262359
ISSUE DATE / TIME: 201805291222
ISSUE DATE / TIME: 201805291622
ISSUE DATE / TIME: 201805311742
Unit Type and Rh: 5100
Unit Type and Rh: 5100
Unit Type and Rh: 5100
Unit Type and Rh: 5100

## 2016-07-13 LAB — CULTURE, BLOOD (ROUTINE X 2)
CULTURE: NO GROWTH
Culture: NO GROWTH
Special Requests: ADEQUATE
Special Requests: ADEQUATE

## 2016-07-13 LAB — TYPE AND SCREEN
ABO/RH(D): O POS
Antibody Screen: NEGATIVE
Unit division: 0
Unit division: 0
Unit division: 0
Unit division: 0

## 2016-07-13 LAB — CBC
HCT: 21.9 % — ABNORMAL LOW (ref 39.0–52.0)
HEMOGLOBIN: 6.8 g/dL — AB (ref 13.0–17.0)
MCH: 27.2 pg (ref 26.0–34.0)
MCHC: 31.1 g/dL (ref 30.0–36.0)
MCV: 87.6 fL (ref 78.0–100.0)
PLATELETS: 296 10*3/uL (ref 150–400)
RBC: 2.5 MIL/uL — AB (ref 4.22–5.81)
RDW: 15.5 % (ref 11.5–15.5)
WBC: 13.1 10*3/uL — AB (ref 4.0–10.5)

## 2016-07-13 LAB — GLUCOSE, CAPILLARY
GLUCOSE-CAPILLARY: 135 mg/dL — AB (ref 65–99)
GLUCOSE-CAPILLARY: 157 mg/dL — AB (ref 65–99)
Glucose-Capillary: 137 mg/dL — ABNORMAL HIGH (ref 65–99)
Glucose-Capillary: 150 mg/dL — ABNORMAL HIGH (ref 65–99)

## 2016-07-13 LAB — PREPARE RBC (CROSSMATCH)

## 2016-07-13 MED ORDER — SODIUM CHLORIDE 0.9 % IV SOLN
Freq: Once | INTRAVENOUS | Status: AC
  Administered 2016-07-13: 12:00:00 via INTRAVENOUS

## 2016-07-13 NOTE — Progress Notes (Signed)
Hemoglobin 6.8. MD paged. Received 2 units PRBC. Will transfuse today.

## 2016-07-13 NOTE — Progress Notes (Signed)
PROGRESS NOTE    Cory Weber  JJK:[id number] DOB: [birth year] DOA: 07/08/2016 PCP: Iona Beard, MD    Brief Narrative:  Cory Weber is a 66 y.o. male with medical history significant of asthma, BPH, CAD, history of CVA, hepatitis C, hyperlipidemia, hypertension, PVD with left BKA, diabetes, and recent hospitalization 05/31/16-06/04/16 for treatment of ischemia of right foot with gangrene, status post arthrectomy and balloon angioplasty of right SFA with stent, discharged on aspirin and Plavix, who was admitted 06/10/16 for severe right foot pain, was subsequently evaluated by vascular surgery and ultimately underwent a right transmetatarsal amputation on 06/13/16.  He was discharged to [facility] on 5/7, but reports his foot hasn't improved. He was admitted for evaluation of the wound infection. Vascular surgery consulted, MRi of the foot ordered and possible debridement in the near future.   Assessment & Plan:   Diabetic Right foot Wound -continue Vanc/Zosyn Day 6, stop today -pain control -VVS Dr.Cain consulting, s/p recent revascularization of right sfa with stenting and subsequent tma, now with breakdown -s/p debridement of R TMA by Dr.Cain 5/30 -Went back to OR by VVS and Podiatry Dr.Evans and underwent Lisfranc Amputation and wound vac placement -transfuse 2units PRBC  Hypertension:  - well controlled on home meds  Anemia due to blood loss and chronic disease -received two units prbc last hospitalization earlier this month -s/p 2 units PRBC his admission, monitor Hb -didn't get blood yesterday, Hb down further, will transfuse 2 units PRBC today  Diabetes Mellitus:  -continue Lantus, SSI -novolog meal coverage, stable  H/o Chronic diastolic heart failure:  Appears to be compensated.  -diuretics on hold  Urinary retention -now with coude, will d/w staff to find out if this can be removed  DVT prophylaxis: (Lovenox/) Code Status: (Full) Family Communication: none  at bedside.  Disposition Plan: SNF Monday if stable   Consultants:   Vascular surgery.   Procedure Performed:   5/30 Dr.Cain : Revision of right transmetatarsal amputation   5/31: Dr.Evans and Dr.Cain 1. Lisfranc amputation right foot 2. Delayed primary closure right foot 3. Application negative pressure wound VAC   Antimicrobials: vancomycin and zosyn from 5/28   Subjective: -feels ok, no issues overnight  Objective: Vitals:   07/12/16 1618 07/12/16 1934 07/13/16 0405 07/13/16 1247  BP: (!) 143/59 137/63 110/81   Pulse: (!) 102 (!) 111 (!) 51 90  Resp: 17   18  Temp: 98.8 F (37.1 C) (!) 100.9 F (38.3 C) 99.6 F (37.6 C) 98.1 F (36.7 C)  TempSrc: Oral Oral Oral Oral  SpO2: 98% 95% 92% 99%  Weight:      Height:        Intake/Output Summary (Last 24 hours) at 07/13/16 1316 Last data filed at 07/13/16 1100  Gross per 24 hour  Intake             3700 ml  Output             1000 ml  Net             2700 ml   Filed Weights   07/08/16 1424  Weight: 86.2 kg (190 lb)    Examination:  General exam: AAOx3, no distress Respiratory system: CTAB, no rales Cardiovascular system: S1S2/RRR, no murmurs Gastrointestinal system: Abdomen is nondistended, soft and nontender. No organomegaly or masses felt. Normal bowel sounds heard. Central nervous system: sleeping, full neuro exam couldn't be done. . Extremities: right foot with mid foot amputation, dressing, vac Left BKA.  Skin: see above     Data Reviewed: I have personally reviewed following labs and imaging studies  CBC:  Recent Labs Lab 07/08/16 1505  07/10/16 0607 07/11/16 0651 07/11/16 2251 07/12/16 0659 07/13/16 0633  WBC 9.8  < > 9.9 13.2* 9.8 13.2* 13.1*  NEUTROABS 6.3  --   --   --   --   --   --   HGB 7.4*  < > 8.5* 8.3* 7.0* 7.2* 6.8*  HCT 24.8*  < > 27.2* 26.8* 22.6* 22.8* 21.9*  MCV 85.8  < > 86.3 87.3 86.9 86.7 87.6  PLT 475*  < > 412* 394 297 309 296  < > = values in this interval  not displayed. Basic Metabolic Panel:  Recent Labs Lab 07/08/16 1505 07/09/16 0531 07/10/16 0607 07/11/16 0651  NA 133* 135 133* 134*  K 4.2 4.2 4.5 4.3  CL 97* 99* 101 100*  CO2 28 27 27 25   GLUCOSE 74 102* 158* 128*  BUN 12 13 14 10   CREATININE 1.09 1.14 1.06 1.20  CALCIUM 9.2 8.8* 8.8* 9.0   GFR: Estimated Creatinine Clearance: 71.2 mL/min (by C-G formula based on SCr of 1.2 mg/dL). Liver Function Tests:  Recent Labs Lab 07/08/16 1505  AST 16  ALT 11*  ALKPHOS 69  BILITOT 0.2*  PROT 8.8*  ALBUMIN 3.1*   No results for input(s): LIPASE, AMYLASE in the last 168 hours. No results for input(s): AMMONIA in the last 168 hours. Coagulation Profile: No results for input(s): INR, PROTIME in the last 168 hours. Cardiac Enzymes: No results for input(s): CKTOTAL, CKMB, CKMBINDEX, TROPONINI in the last 168 hours. BNP (last 3 results) No results for input(s): PROBNP in the last 8760 hours. HbA1C: No results for input(s): HGBA1C in the last 72 hours. CBG:  Recent Labs Lab 07/12/16 1202 07/12/16 1634 07/12/16 2131 07/13/16 0633 07/13/16 1227  GLUCAP 146* 211* 237* 135* 137*   Lipid Profile: No results for input(s): CHOL, HDL, LDLCALC, TRIG, CHOLHDL, LDLDIRECT in the last 72 hours. Thyroid Function Tests: No results for input(s): TSH, T4TOTAL, FREET4, T3FREE, THYROIDAB in the last 72 hours. Anemia Panel: No results for input(s): VITAMINB12, FOLATE, FERRITIN, TIBC, IRON, RETICCTPCT in the last 72 hours. Sepsis Labs:  Recent Labs Lab 07/08/16 1521  LATICACIDVEN 1.31    Recent Results (from the past 240 hour(s))  Blood Cultures x 2 sites     Status: None (Preliminary result)   Collection Time: 07/08/16 10:33 PM  Result Value Ref Range Status   Specimen Description BLOOD RIGHT HAND  Final   Special Requests IN PEDIATRIC BOTTLE Blood Culture adequate volume  Final   Culture NO GROWTH 4 DAYS  Final   Report Status PENDING  Incomplete  Blood Cultures x 2 sites      Status: None (Preliminary result)   Collection Time: 07/08/16 10:33 PM  Result Value Ref Range Status   Specimen Description BLOOD LEFT HAND  Final   Special Requests IN PEDIATRIC BOTTLE Blood Culture adequate volume  Final   Culture NO GROWTH 4 DAYS  Final   Report Status PENDING  Incomplete  Surgical PCR screen     Status: Abnormal   Collection Time: 07/09/16  8:52 AM  Result Value Ref Range Status   MRSA, PCR NEGATIVE NEGATIVE Final   Staphylococcus aureus POSITIVE (A) NEGATIVE Final    Comment:        The Xpert SA Assay (FDA approved for NASAL specimens in patients over 48 years of age),  is one component of a comprehensive surveillance program.  Test performance has been validated by [facility] for patients greater than or equal to 56 year old. It is not intended to diagnose infection nor to guide or monitor treatment.          Radiology Studies: Dg Foot Complete Right  Result Date: 07/12/2016 CLINICAL DATA:  Status post amputation.  Diabetes mellitus. EXAM: RIGHT FOOT COMPLETE - 3+ VIEW COMPARISON:  Jul 08, 2016 FINDINGS: , oblique, and lateral views were obtained. There has been interval amputation of the remaining proximal metatarsals. No bone is seen distal to the distal tarsal row. There is soft tissue air within the stump consistent with recent surgery. There is no fracture or dislocation. No bony destruction is seen currently. Areas of arterial vascular calcification consistent with known diabetes mellitus. IMPRESSION: There has now been removal of remaining proximal metatarsals. Soft tissue air in the stomach is felt to be of postoperative etiology. No fracture or dislocation in remaining bones. No erosive change or bony destruction evident currently. Extensive arterial vascular calcification is consistent with diabetes mellitus. Electronically Signed   By: Lowella Grip III M.D.   On: 07/12/2016 10:06        Scheduled Meds: . allopurinol  200 mg Oral QPC  supper  . aspirin  325 mg Oral Daily  . carvedilol  3.125 mg Oral BID WC  . clopidogrel  75 mg Oral Daily  . feeding supplement (GLUCERNA SHAKE)  237 mL Oral BID BM  . feeding supplement (PRO-STAT SUGAR FREE 64)  30 mL Oral Daily  . ferrous sulfate  325 mg Oral TID WC  . gabapentin  600 mg Oral BID   And  . gabapentin  1,200 mg Oral QHS  . insulin aspart  0-15 Units Subcutaneous TID WC  . insulin aspart  3 Units Subcutaneous TID WC  . insulin glargine  25 Units Subcutaneous QHS  . lisinopril  5 mg Oral Daily  . morphine  15 mg Oral Q12H  . mupirocin ointment   Nasal BID  . polyethylene glycol  17 g Oral Daily  . senna-docusate  1 tablet Oral BID  . simvastatin  20 mg Oral Daily  . tamsulosin  0.4 mg Oral Daily   Continuous Infusions: . sodium chloride    . sodium chloride    . piperacillin-tazobactam (ZOSYN)  IV Stopped (07/13/16 1231)  . vancomycin Stopped (07/13/16 0200)     LOS: 5 days    Time spent: 35 minutes.     Domenic Polite, MD [facility] Pager [phone]   If 7PM-7AM, please contact night-coverage [email or web address] Password [email or web address] 07/13/2016, 1:16 PM

## 2016-07-14 LAB — BASIC METABOLIC PANEL
ANION GAP: 5 (ref 5–15)
BUN: 10 mg/dL (ref 6–20)
CO2: 26 mmol/L (ref 22–32)
Calcium: 8.3 mg/dL — ABNORMAL LOW (ref 8.9–10.3)
Chloride: 101 mmol/L (ref 101–111)
Creatinine, Ser: 0.96 mg/dL (ref 0.61–1.24)
GFR calc Af Amer: >60 mL/min (ref >60)
GFR calc non Af Amer: >60 mL/min (ref >60)
GLUCOSE: 140 mg/dL — AB (ref 65–99)
Potassium: 3.8 mmol/L (ref 3.5–5.1)
Sodium: 132 mmol/L — ABNORMAL LOW (ref 135–145)

## 2016-07-14 LAB — BPAM RBC
BLOOD PRODUCT EXPIRATION DATE: 201806262359
Blood Product Expiration Date: 201806262359
ISSUE DATE / TIME: 201806021530
ISSUE DATE / TIME: 201806021254
UNIT TYPE AND RH: 5100
Unit Type and Rh: 5100

## 2016-07-14 LAB — TYPE AND SCREEN
ABO/RH(D): O POS
ANTIBODY SCREEN: NEGATIVE
UNIT DIVISION: 0
Unit division: 0

## 2016-07-14 LAB — GLUCOSE, CAPILLARY
GLUCOSE-CAPILLARY: 148 mg/dL — AB (ref 65–99)
GLUCOSE-CAPILLARY: 165 mg/dL — AB (ref 65–99)
GLUCOSE-CAPILLARY: 106 mg/dL — AB (ref 65–99)
Glucose-Capillary: 101 mg/dL — ABNORMAL HIGH (ref 65–99)

## 2016-07-14 LAB — HEMOGLOBIN AND HEMATOCRIT, BLOOD
HEMATOCRIT: 26.4 % — AB (ref 39.0–52.0)
Hemoglobin: 8.2 g/dL — ABNORMAL LOW (ref 13.0–17.0)

## 2016-07-14 MED ORDER — POLYETHYLENE GLYCOL 3350 17 G PO PACK
17.0000 g | PACK | Freq: Two times a day (BID) | ORAL | Status: DC
Start: 2016-07-14 — End: 2016-07-17
  Administered 2016-07-15 – 2016-07-16 (×2): 17 g via ORAL
  Filled 2016-07-14 (×2): qty 1

## 2016-07-14 MED ORDER — LACTULOSE 10 GM/15ML PO SOLN
10.0000 g | Freq: Two times a day (BID) | ORAL | Status: DC
  Administered 2016-07-14 – 2016-07-16 (×3): 10 g via ORAL
  Filled 2016-07-14 (×5): qty 15

## 2016-07-14 NOTE — Progress Notes (Signed)
PROGRESS NOTE    RONNEL ZUERCHER  FHL:[id number] DOB: [birth year] DOA: 07/08/2016 PCP: Iona Beard, MD    Brief Narrative:  Cory Weber is a 66 y.o. male with medical history significant of asthma, BPH, CAD, history of CVA, hepatitis C, hyperlipidemia, hypertension, PVD with left BKA, diabetes, and recent hospitalization 05/31/16-06/04/16 for treatment of ischemia of right foot with gangrene, status post arthrectomy and balloon angioplasty of right SFA with stent, discharged on aspirin and Plavix, who was admitted 06/10/16 for severe right foot pain, was subsequently evaluated by vascular surgery and ultimately underwent a right transmetatarsal amputation on 06/13/16.  He was discharged to [facility] on 5/7, but reports his foot hasn't improved. He was admitted for evaluation of the wound infection. Vascular surgery consulted, MRi of the foot ordered and possible debridement in the near future.   Assessment & Plan:   Diabetic Right foot Wound -continue Vanc/Zosyn Day 6, stopped Abx yesterday -pain control -VVS Dr.Cain consulting, s/p recent revascularization of right sfa with stenting and subsequent TMA, now with breakdown -s/p debridement of R TMA by Dr.Cain 5/30 -Went back to OR by VVS and Podiatry Dr.Evans and underwent Lisfranc Amputation and wound vac placement -transfused 2units PRBC yesterday -SNF in 1-2days   Hypertension:  - well controlled on home meds    Anemia due to blood loss and chronic disease -received two units prbc last hospitalization earlier this month -s/p 2 units PRBC his admission, monitor Hb -then again transfused 2units yesterday  Diabetes Mellitus:  -continue Lantus, SSI -novolog meal coverage, stable  H/o Chronic diastolic heart failure:  Appears to be compensated.  -diuretics on hold  Urinary retention -now with coude, will d/w staff to find out if this can be removed  Constipation -increase laxatives  DVT prophylaxis: lovenox Code Status:  Full Code Family Communication:None at bedside  Disposition Plan: SNF Monday if stable   Consultants:   Vascular surgery.   Procedure Performed:   5/30 Dr.Cain : Revision of right transmetatarsal amputation   5/31: Dr.Evans and Dr.Cain 1. Lisfranc amputation right foot 2. Delayed primary closure right foot 3. Application negative pressure wound VAC   Antimicrobials: vancomycin and zosyn from 5/28   Subjective: -feels ok, c/o constipation  Objective: Vitals:   07/13/16 2115 07/14/16 0415 07/14/16 0826 07/14/16 1042  BP: (!) 150/66 (!) 124/56 115/64 125/67  Pulse: 89 87 98 72  Resp:      Temp: 99.8 F (37.7 C) 99.1 F (37.3 C) 98.9 F (37.2 C)   TempSrc: Oral Oral Oral   SpO2: 100% 99% 99%   Weight:      Height:        Intake/Output Summary (Last 24 hours) at 07/14/16 1229 Last data filed at 07/14/16 3734  Gross per 24 hour  Intake           1226.5 ml  Output              600 ml  Net            626.5 ml   Filed Weights   07/08/16 1424  Weight: 86.2 kg (190 lb)    Examination:  General exam: AAOx3, no distress Respiratory system: CTAB, no rales Cardiovascular system: S1S2/RRR, no murmurs Gastrointestinal system: Abdomen is nondistended, soft and nontender. Normal bowel sounds heard. Central nervous system: sleeping, full neuro exam couldn't be done. . Extremities: right foot with mid foot amputation, dressing, vac Left BKA.   Skin: see above     Data Reviewed:  I have personally reviewed following labs and imaging studies  CBC:  Recent Labs Lab 07/08/16 1505  07/10/16 0607 07/11/16 0651 07/11/16 2251 07/12/16 0659 07/13/16 0633 07/14/16 0008  WBC 9.8  < > 9.9 13.2* 9.8 13.2* 13.1*  --   NEUTROABS 6.3  --   --   --   --   --   --   --   HGB 7.4*  < > 8.5* 8.3* 7.0* 7.2* 6.8* 8.2*  HCT 24.8*  < > 27.2* 26.8* 22.6* 22.8* 21.9* 26.4*  MCV 85.8  < > 86.3 87.3 86.9 86.7 87.6  --   PLT 475*  < > 412* 394 297 309 296  --   < > = values in  this interval not displayed. Basic Metabolic Panel:  Recent Labs Lab 07/08/16 1505 07/09/16 0531 07/10/16 0607 07/11/16 0651 07/14/16 0008  NA 133* 135 133* 134* 132*  K 4.2 4.2 4.5 4.3 3.8  CL 97* 99* 101 100* 101  CO2 28 27 27 25 26   GLUCOSE 74 102* 158* 128* 140*  BUN 12 13 14 10 10   CREATININE 1.09 1.14 1.06 1.20 0.96  CALCIUM 9.2 8.8* 8.8* 9.0 8.3*   GFR: Estimated Creatinine Clearance: 89 mL/min (by C-G formula based on SCr of 0.96 mg/dL). Liver Function Tests:  Recent Labs Lab 07/08/16 1505  AST 16  ALT 11*  ALKPHOS 69  BILITOT 0.2*  PROT 8.8*  ALBUMIN 3.1*   No results for input(s): LIPASE, AMYLASE in the last 168 hours. No results for input(s): AMMONIA in the last 168 hours. Coagulation Profile: No results for input(s): INR, PROTIME in the last 168 hours. Cardiac Enzymes: No results for input(s): CKTOTAL, CKMB, CKMBINDEX, TROPONINI in the last 168 hours. BNP (last 3 results) No results for input(s): PROBNP in the last 8760 hours. HbA1C: No results for input(s): HGBA1C in the last 72 hours. CBG:  Recent Labs Lab 07/13/16 1227 07/13/16 1639 07/13/16 2126 07/14/16 0634 07/14/16 1152  GLUCAP 137* 150* 157* 101* 148*   Lipid Profile: No results for input(s): CHOL, HDL, LDLCALC, TRIG, CHOLHDL, LDLDIRECT in the last 72 hours. Thyroid Function Tests: No results for input(s): TSH, T4TOTAL, FREET4, T3FREE, THYROIDAB in the last 72 hours. Anemia Panel: No results for input(s): VITAMINB12, FOLATE, FERRITIN, TIBC, IRON, RETICCTPCT in the last 72 hours. Sepsis Labs:  Recent Labs Lab 07/08/16 1521  LATICACIDVEN 1.31    Recent Results (from the past 240 hour(s))  Blood Cultures x 2 sites     Status: None   Collection Time: 07/08/16 10:33 PM  Result Value Ref Range Status   Specimen Description BLOOD RIGHT HAND  Final   Special Requests IN PEDIATRIC BOTTLE Blood Culture adequate volume  Final   Culture NO GROWTH 5 DAYS  Final   Report Status  07/13/2016 FINAL  Final  Blood Cultures x 2 sites     Status: None   Collection Time: 07/08/16 10:33 PM  Result Value Ref Range Status   Specimen Description BLOOD LEFT HAND  Final   Special Requests IN PEDIATRIC BOTTLE Blood Culture adequate volume  Final   Culture NO GROWTH 5 DAYS  Final   Report Status 07/13/2016 FINAL  Final  Surgical PCR screen     Status: Abnormal   Collection Time: 07/09/16  8:52 AM  Result Value Ref Range Status   MRSA, PCR NEGATIVE NEGATIVE Final   Staphylococcus aureus POSITIVE (A) NEGATIVE Final    Comment:        The  Xpert SA Assay (FDA approved for NASAL specimens in patients over 23 years of age), is one component of a comprehensive surveillance program.  Test performance has been validated by [facility] for patients greater than or equal to 9 year old. It is not intended to diagnose infection nor to guide or monitor treatment.          Radiology Studies: No results found.      Scheduled Meds: . allopurinol  200 mg Oral QPC supper  . aspirin  325 mg Oral Daily  . carvedilol  3.125 mg Oral BID WC  . clopidogrel  75 mg Oral Daily  . feeding supplement (GLUCERNA SHAKE)  237 mL Oral BID BM  . feeding supplement (PRO-STAT SUGAR FREE 64)  30 mL Oral Daily  . ferrous sulfate  325 mg Oral TID WC  . gabapentin  600 mg Oral BID   And  . gabapentin  1,200 mg Oral QHS  . insulin aspart  0-15 Units Subcutaneous TID WC  . insulin aspart  3 Units Subcutaneous TID WC  . insulin glargine  25 Units Subcutaneous QHS  . lactulose  10 g Oral BID  . lisinopril  5 mg Oral Daily  . morphine  15 mg Oral Q12H  . polyethylene glycol  17 g Oral BID  . senna-docusate  1 tablet Oral BID  . simvastatin  20 mg Oral Daily  . tamsulosin  0.4 mg Oral Daily   Continuous Infusions:    LOS: 6 days    Time spent: 35 minutes.     Domenic Polite, MD [facility] Pager [phone]   If 7PM-7AM, please contact  night-coverage [email or web address] Password [facility] 07/14/2016, 12:29 PM

## 2016-07-15 LAB — CBC
HCT: 27.4 % — ABNORMAL LOW (ref 39.0–52.0)
Hemoglobin: 8.5 g/dL — ABNORMAL LOW (ref 13.0–17.0)
MCH: 27.4 pg (ref 26.0–34.0)
MCHC: 31 g/dL (ref 30.0–36.0)
MCV: 88.4 fL (ref 78.0–100.0)
Platelets: 333 10*3/uL (ref 150–400)
RBC: 3.1 MIL/uL — ABNORMAL LOW (ref 4.22–5.81)
RDW: 15.1 % (ref 11.5–15.5)
WBC: 11.9 10*3/uL — AB (ref 4.0–10.5)

## 2016-07-15 LAB — GLUCOSE, CAPILLARY
GLUCOSE-CAPILLARY: 111 mg/dL — AB (ref 65–99)
GLUCOSE-CAPILLARY: 120 mg/dL — AB (ref 65–99)
GLUCOSE-CAPILLARY: 129 mg/dL — AB (ref 65–99)
Glucose-Capillary: 87 mg/dL (ref 65–99)
Glucose-Capillary: 186 mg/dL — ABNORMAL HIGH (ref 65–99)

## 2016-07-15 LAB — BASIC METABOLIC PANEL
Anion gap: 6 (ref 5–15)
BUN: 10 mg/dL (ref 6–20)
CALCIUM: 8.4 mg/dL — AB (ref 8.9–10.3)
CHLORIDE: 100 mmol/L — AB (ref 101–111)
CO2: 27 mmol/L (ref 22–32)
CREATININE: 1 mg/dL (ref 0.61–1.24)
GFR calc Af Amer: >60 mL/min (ref >60)
GFR calc non Af Amer: >60 mL/min (ref >60)
Glucose, Bld: 115 mg/dL — ABNORMAL HIGH (ref 65–99)
Potassium: 4 mmol/L (ref 3.5–5.1)
SODIUM: 133 mmol/L — AB (ref 135–145)

## 2016-07-15 MED ORDER — INSULIN ASPART 100 UNIT/ML ~~LOC~~ SOLN: 5.0000 [IU] | Freq: Three times a day (TID) | SUBCUTANEOUS | Status: DC

## 2016-07-15 MED ORDER — GABAPENTIN 600 MG PO TABS: 600.0000 mg | ORAL_TABLET | Freq: Three times a day (TID) | ORAL | Status: DC

## 2016-07-15 MED ORDER — INSULIN GLARGINE 100 UNIT/ML ~~LOC~~ SOLN: 25.0000 [IU] | Freq: Every day | SUBCUTANEOUS | Status: DC

## 2016-07-15 MED ORDER — ACETAMINOPHEN 10 MG/ML IV SOLN: 1000.0000 mg | Freq: Four times a day (QID) | INTRAVENOUS | Status: DC

## 2016-07-15 MED ORDER — ACETAMINOPHEN 10 MG/ML IV SOLN
1000.0000 mg | Freq: Once | INTRAVENOUS | Status: AC
  Administered 2016-07-15: 1000 mg via INTRAVENOUS
  Filled 2016-07-15: qty 100

## 2016-07-15 MED ORDER — SODIUM CHLORIDE 0.9 % IV BOLUS (SEPSIS)
500.0000 mL | Freq: Once | INTRAVENOUS | Status: AC
  Administered 2016-07-15: 500 mL via INTRAVENOUS

## 2016-07-15 MED ORDER — OXYCODONE HCL 5 MG PO TABS: 10.0000 mg | ORAL_TABLET | ORAL | Status: DC | PRN

## 2016-07-15 MED ORDER — OXYCODONE HCL 10 MG PO TABS: 10.0000 mg | ORAL_TABLET | ORAL | 0 refills | Status: DC | PRN

## 2016-07-15 MED ORDER — NALOXONE HCL 0.4 MG/ML IJ SOLN
INTRAMUSCULAR | Status: AC
  Administered 2016-07-15: 0.4 mg
  Filled 2016-07-15: qty 1

## 2016-07-15 MED ORDER — MORPHINE SULFATE ER 15 MG PO TBCR: 15.0000 mg | EXTENDED_RELEASE_TABLET | Freq: Two times a day (BID) | ORAL | 0 refills | Status: DC

## 2016-07-15 MED ORDER — POLYETHYLENE GLYCOL 3350 17 G PO PACK: 17.0000 g | PACK | Freq: Two times a day (BID) | ORAL | 0 refills | Status: DC

## 2016-07-15 NOTE — Addendum Note (Signed)
Addendum  created 07/15/16 1404 by Myrtie Soman, MD   Sign clinical note

## 2016-07-15 NOTE — Progress Notes (Signed)
Attemped to give patient his HS medications and place continuous pulse ox d/t continued drowsy state. Pt will awake for a very short period to answer a question or two from the nursing staff, and then goes back to whats observed to be resting w/ eyes closed and takes touch stimulation to awake again. When trying to administer HS medications pt returned to resting state and when awoken by RN he sighed loudly and stated, "You know what is going on here don't you." Nurse asked pt to explain and pt stated, "You want me to wake up but you're giving me all of these medications to make me sleep." RN tried explaining a few times to patient what she was trying to do and also that she was administering insulin. Patient said nothing back, turned his face the other way, and went back to resting state. Charted refusal of medications. Paged on call to notify of event and also temp of 102. Completing environmental measures at this time for temperature relief.

## 2016-07-15 NOTE — Care Management Note (Signed)
Case Management Note  Patient Details  Name: Cory Weber MRN: [id number] Date of Birth: 1950/07/27  Subjective/Objective:   Diabetic right foot wound, HTN, Anemia                 Action/Plan: Discharge Planning: Chart reviewed. Pt has a surgical wound vac, notified CSW. Will update FL2. Scheduled dc back to SNF, [facility].   PCP Iona Beard MD  Expected Discharge Date:  07/15/16               Expected Discharge Plan:  Skilled Nursing Facility  In-House Referral:  Clinical Social Work  Discharge planning Services  CM Consult  Post Acute Care Choice:  NA Choice offered to:  NA  DME Arranged:  N/A DME Agency:  NA  HH Arranged:  NA HH Agency:  NA  Status of Service:  Completed, signed off  If discussed at [place] of Stay Meetings, dates discussed:    Additional Comments:  Erenest Rasher, RN 07/15/2016, 11:43 AM

## 2016-07-15 NOTE — Significant Event (Signed)
Rapid Response Event Note  Overview:   Called by Rn for patient more lethargic  Time Called: 4098 Arrival Time: 1191 Event Type: Other (Comment)  Initial Focused Assessment:  Called by RN for patient with increased lethargy.  On my arrival RN at bedside.  Patient lying in bed, very lethargic opens eyes to loud voice will tell me his name but drifts quickly back to sleep.  VSS on RA. As per RN received oxycodone 15 mg at 1649.  Interventions:  PIV #20g in Right AC on ist attempt placed.  Narcan 0.4mg  IV given per protocol.  Patient responded to narcan quickly and was able to me why he is in the hospital.  Alert and oriented and complaining of pain now.    Plan of Care (if not transferred):  Recommended RN to call MD and update  Event Summary:  RN to call if need assistance    at      at          [facility], [place] Rain

## 2016-07-15 NOTE — Progress Notes (Signed)
Pt was compliant w/ receiving IVF bolus and IV tylenol. Was still drowsy. Lab had just seen patient and he was alert, animated, and holding conversation. He had not recognized this Probation officer, although has been on shift and had many encounters with him since 1900.

## 2016-07-15 NOTE — Progress Notes (Signed)
Patient discharge instructions reviewed with patients. Patient verbalizes understanding. RN called report to SNF. Patient is waiting for [facility] to take him to SNF. Patient belongings with patient. Patient is not in distress.

## 2016-07-15 NOTE — Discharge Summary (Addendum)
Physician Discharge Summary  Cory Weber YBF:[id number] DOB: [birth year] DOA: 07/08/2016  PCP: Cory Beard, MD  Admit date: 07/08/2016 Discharge date: 07/15/2016  Time spent: 35 minutes  Recommendations for Outpatient Follow-up:  1. PCP in 1 week 2. Dr.Brent Evans, Podiatry in 1 week for [facility] REMOVAL 3. VASCULAR Dr.Cain in 2-3weeks   Discharge Diagnoses:  Principal Problem:   Osteomyelitis of foot ([place]) Active Problems:   Essential hypertension   Anemia of chronic disease   PVD (peripheral vascular disease) (HCC)   Amputation of left lower extremity below knee ([place])   Dyslipidemia associated with type 2 diabetes mellitus (HCC)   Ischemia of foot   Chronic diastolic CHF (congestive heart failure) (HCC)   Constipation due to opioid therapy   S/P transmetatarsal amputation of foot, right ([place])   DM 2 on Insulin  Discharge Condition: stable  Diet recommendation: Diabetic heart healthy  Filed Weights   07/08/16 1424  Weight: 86.2 kg (190 lb)    History of present illness:  Cory Harbaugh Scottis a 66 y.o.malewith medical history significant of asthma, BPH, CAD, history of CVA, hepatitis C, hyperlipidemia, hypertension, PVD with left BKA, diabetes, and recent hospitalization 05/31/16-06/04/16 for treatment of ischemia of right foot with gangrene, status post arthrectomy and balloon angioplasty of right SFA with stent, discharged on aspirin and Plavix, who was admitted 06/10/16 for severe right foot pain,was subsequently evaluated by vascular surgery and ultimately underwent a right transmetatarsal amputation on 06/13/16.  He was discharged to [facility] on 5/7, but reports his foot hasn't improved. He was admitted for evaluation of the wound infection/necrosis of amputation site  Hospital Course:  Diabetic Right foot Wound -status post arthrectomy and balloon angioplasty of right SFA with stent, discharged on aspirin and Plavix in April to SNF, then readmitted and underwent Right  transmetatarsal amputation last admission on 06/13/16 -finally admitted now with amputation site wound necrosis -treated with broad spectrum Abx -Vascular consulted, seen by Dr.Cain underwent debridement of R TMA, the same evening he had a lot of bleeding and was taking back to the OR by Dr.Cain VVS and Dr.Evans from Podiatry -underwent Lisfranc Amputation and wound vac placement -now stable for discharge back to SNF, needs FU with Dr.EVans Podiatry in 1 week for VAC removal and Dr.Cain Vascular in 2-3weeks  Hypertension:  - well controlled on home meds    Anemia due to blood loss and chronic disease -s/p 3 units PRBC this admission -stable now  Diabetes Mellitus:  -stable, will continue lantus and novolog at SNF  H/o Chronic diastolic heart failure:  - compensated.  -diuretics resumed at discahrge  Urinary retention -noted post op, required FOley catheter placement 6/1 -FOley removed today 6/4  Constipation -increased laxatives -had a BM yesterday   Procedure Performed:   5/30 Dr.Cain : Revision of right transmetatarsal amputation   5/31: Dr.Evans and Dr.Cain 1. Lisfranc amputation right foot 2. Delayed primary closure right foot 3. Application negative pressure wound VAC   Consultations:  Vascular  Podiatry  Discharge Exam: Vitals:   07/15/16 1800 07/15/16 2052  BP: (!) 154/68 (!) 146/69  Pulse:  (!) 13  Resp:  16  Temp:  (!) 102.3 F (39.1 C)    General: AAOx3 Cardiovascular: S1S2/RRR Respiratory: CTAB  Discharge Instructions   Discharge Instructions    Diet - low sodium heart healthy    Complete by:  As directed    Diet Carb Modified    Complete by:  As directed    Increase activity slowly  Complete by:  As directed      Current Discharge Medication List    CONTINUE these medications which have CHANGED   Details  gabapentin (NEURONTIN) 600 MG tablet Take 1 tablet (600 mg total) by mouth 3 (three) times daily.    insulin  aspart (NOVOLOG) 100 UNIT/ML injection Inject 5 Units into the skin 3 (three) times daily after meals.    insulin glargine (LANTUS) 100 UNIT/ML injection Inject 0.25 mLs (25 Units total) into the skin at bedtime.    morphine (MS CONTIN) 15 MG 12 hr tablet Take 1 tablet (15 mg total) by mouth every 12 (twelve) hours. Qty: 20 tablet, Refills: 0    Oxycodone HCl 10 MG TABS Take 1-1.5 tablets (10-15 mg total) by mouth every 4 (four) hours as needed. Qty: 20 tablet, Refills: 0    polyethylene glycol (MIRALAX / GLYCOLAX) packet Take 17 g by mouth 2 (two) times daily. Qty: 14 each, Refills: 0      CONTINUE these medications which have NOT CHANGED   Details  allopurinol (ZYLOPRIM) 100 MG tablet Take 200 mg by mouth every evening.    aspirin EC 81 MG tablet Take 81 mg by mouth daily.    carvedilol (COREG) 3.125 MG tablet Take 1 tablet (3.125 mg total) by mouth 2 (two) times daily with a meal. Qty: 60 tablet, Refills: 0    clopidogrel (PLAVIX) 75 MG tablet Take 1 tablet (75 mg total) by mouth daily with breakfast. Qty: 30 tablet, Refills: 0    ferrous sulfate 325 (65 FE) MG tablet Take 325 mg by mouth 3 (three) times daily with meals.    lisinopril (PRINIVIL,ZESTRIL) 5 MG tablet Take 5 mg by mouth daily.     magnesium hydroxide (MILK OF MAGNESIA) 400 MG/5ML suspension Take 30 mLs by mouth daily as needed for mild constipation.    metFORMIN (GLUCOPHAGE) 500 MG tablet Take 500 mg by mouth daily with breakfast.    senna-docusate (SENOKOT-S) 8.6-50 MG tablet Take 1 tablet by mouth 2 (two) times daily.    simvastatin (ZOCOR) 20 MG tablet Take 20 mg by mouth daily.     tamsulosin (FLOMAX) 0.4 MG CAPS capsule Take 1 capsule (0.4 mg total) by mouth daily. Qty: 30 capsule, Refills: 0    torsemide (DEMADEX) 20 MG tablet Take 20 mg by mouth daily.       STOP taking these medications     methocarbamol (ROBAXIN) 500 MG tablet      cephALEXin (KEFLEX) 500 MG capsule      aspirin 325 MG  tablet        Allergies  Allergen Reactions  . No Known Allergies     Contact information for follow-up providers    Cory Beard, MD. Schedule an appointment as soon as possible for a visit in 1 week(s).   Specialty:  Family Medicine Contact information: [address] [place] [address] [phone]        Edrick Kins, DPM. Schedule an appointment as soon as possible for a visit in 1 week(s).   Specialty:  Podiatry Why:  for [facility] Removal Contact information: [address] [place] [address] [phone]        Waynetta Sandy, MD. Schedule an appointment as soon as possible for a visit in 3 week(s).   Specialties:  Vascular Surgery, Cardiology Contact information: [address] [place] [address] [phone]            Contact  information for after-discharge care    Destination    [facility] Follow up.   Specialty:  [place] information: [address] [place] [phone]                   The results of significant diagnostics from this hospitalization (including imaging, microbiology, ancillary and laboratory) are listed below for reference.    Significant Diagnostic Studies: Dg Foot Complete Right  Result Date: 07/12/2016 CLINICAL DATA:  Status post amputation.  Diabetes mellitus. EXAM: RIGHT FOOT COMPLETE - 3+ VIEW COMPARISON:  Jul 08, 2016 FINDINGS: , oblique, and lateral views were obtained. There has been interval amputation of the remaining proximal metatarsals. No bone is seen distal to the distal tarsal row. There is soft tissue air within the stump consistent with recent surgery. There is no fracture or dislocation. No bony destruction is seen currently. Areas of arterial vascular calcification consistent with known diabetes mellitus. IMPRESSION: There has now been removal of remaining proximal metatarsals. Soft tissue air in  the stomach is felt to be of postoperative etiology. No fracture or dislocation in remaining bones. No erosive change or bony destruction evident currently. Extensive arterial vascular calcification is consistent with diabetes mellitus. Electronically Signed   By: Lowella Grip III M.D.   On: 07/12/2016 10:06   Dg Foot Complete Right  Result Date: 07/08/2016 CLINICAL DATA:  Right foot infection. EXAM: RIGHT FOOT COMPLETE - 3+ VIEW COMPARISON:  06/27/2016. FINDINGS: Postsurgical changes of right forefoot amputation at the level of the proximal metatarsal bones identified. There is a focal area of bone demineralization involving the distal tip of the remaining first metatarsal bone which is new from previous exam and may reflect underlying osteomyelitis. Soft tissue swelling overlying the stump is identified. IMPRESSION: 1. Postop change from right forefoot amputation. 2. Focal area of new bone demineralization involving the tip of the remaining first metatarsal bone which may represent superimposed osteomyelitis. Electronically Signed   By: Kerby Moors M.D.   On: 07/08/2016 15:28   Dg Foot Complete Right  Result Date: 06/27/2016 CLINICAL DATA:  Valve postop appearance following amputation EXAM: RIGHT FOOT COMPLETE - 3+ VIEW COMPARISON:  06/10/16 FINDINGS: Transmetatarsal amputation is noted. A few small bony fragments are noted in the surgical bed. No erosive changes are seen. No other focal abnormality is noted. IMPRESSION: Postsurgical changes as described. Electronically Signed   By: Inez Catalina M.D.   On: 06/27/2016 13:34    Microbiology: Recent Results (from the past 240 hour(s))  Blood Cultures x 2 sites     Status: None   Collection Time: 07/08/16 10:33 PM  Result Value Ref Range Status   Specimen Description BLOOD RIGHT HAND  Final   Special Requests IN PEDIATRIC BOTTLE Blood Culture adequate volume  Final   Culture NO GROWTH 5 DAYS  Final   Report Status 07/13/2016 FINAL  Final   Blood Cultures x 2 sites     Status: None   Collection Time: 07/08/16 10:33 PM  Result Value Ref Range Status   Specimen Description BLOOD LEFT HAND  Final   Special Requests IN PEDIATRIC BOTTLE Blood Culture adequate volume  Final   Culture NO GROWTH 5 DAYS  Final   Report Status 07/13/2016 FINAL  Final  Surgical PCR screen     Status: Abnormal   Collection Time: 07/09/16  8:52 AM  Result Value Ref Range Status   MRSA, PCR NEGATIVE NEGATIVE  Final   Staphylococcus aureus POSITIVE (A) NEGATIVE Final    Comment:        The Xpert SA Assay (FDA approved for NASAL specimens in patients over 37 years of age), is one component of a comprehensive surveillance program.  Test performance has been validated by [facility] for patients greater than or equal to 6 year old. It is not intended to diagnose infection nor to guide or monitor treatment.      Labs: Basic Metabolic Panel:  Recent Labs Lab 07/09/16 0531 07/10/16 0607 07/11/16 0651 07/14/16 0008 07/15/16 0448  NA 135 133* 134* 132* 133*  K 4.2 4.5 4.3 3.8 4.0  CL 99* 101 100* 101 100*  CO2 27 27 25 26 27   GLUCOSE 102* 158* 128* 140* 115*  BUN 13 14 10 10 10   CREATININE 1.14 1.06 1.20 0.96 1.00  CALCIUM 8.8* 8.8* 9.0 8.3* 8.4*   Liver Function Tests: No results for input(s): AST, ALT, ALKPHOS, BILITOT, PROT, ALBUMIN in the last 168 hours. No results for input(s): LIPASE, AMYLASE in the last 168 hours. No results for input(s): AMMONIA in the last 168 hours. CBC:  Recent Labs Lab 07/11/16 0651 07/11/16 2251 07/12/16 0659 07/13/16 0633 07/14/16 0008 07/15/16 0448  WBC 13.2* 9.8 13.2* 13.1*  --  11.9*  HGB 8.3* 7.0* 7.2* 6.8* 8.2* 8.5*  HCT 26.8* 22.6* 22.8* 21.9* 26.4* 27.4*  MCV 87.3 86.9 86.7 87.6  --  88.4  PLT 394 297 309 296  --  333   Cardiac Enzymes: No results for input(s): CKTOTAL, CKMB, CKMBINDEX, TROPONINI in the last 168 hours. BNP: BNP (last 3 results)  Recent Labs  12/22/15 1551  BNP  52.4    ProBNP (last 3 results) No results for input(s): PROBNP in the last 8760 hours.  CBG:  Recent Labs Lab 07/15/16 0705 07/15/16 1144 07/15/16 1717 07/15/16 1836 07/15/16 2058  GLUCAP 111* 87 120* 129* 186*       SignedDomenic Polite MD.  [facility] 07/15/2016, 10:11 PM

## 2016-07-15 NOTE — Progress Notes (Signed)
RN tried to wake patient for his scheduled medications. Patient opened his eyes to verbal stimulation, but immediately fell back asleep. Patient vitals were 154/66, pulse 98, oxygen 96% on room air. RN notified Rapid Response. RN administered Narcan. Patient is now alert and oriented x4. Patient vitals now 151/80, pulse 102, oxygen 100% on room air.

## 2016-07-15 NOTE — Social Work (Signed)
Clinical Social Worker facilitated patient discharge including contacting patient family and facility to confirm patient discharge plans.  Clinical information faxed to facility and family agreeable with plan.  CSW arranged ambulance transport via [facility] to [facility].  RN to call report [phone] prior to discharge.  Clinical Social Worker will sign off for now as social work intervention is no longer needed. Please consult [place] again if new need arises.  Elissa Hefty, LCSW Clinical Social Worker [phone]

## 2016-07-15 NOTE — Anesthesia Postprocedure Evaluation (Signed)
Anesthesia Post Note  Patient: Cory Weber  Procedure(s) Performed: Procedure(s) (LRB): REVISION RIGHT TRANSMETATARSAL AMPUTATION (Right)     Anesthesia Post Evaluation  Last Vitals:  Vitals:   07/14/16 2211 07/15/16 0701  BP: 113/67 130/65  Pulse: 98 83  Resp: 16 16  Temp: 36.9 C (!) 38 C    Last Pain:  Vitals:   07/15/16 0927  TempSrc:   PainSc: 5                  Wednesday Ericsson S

## 2016-07-15 NOTE — Progress Notes (Signed)
Received call back from Schoor, MD on call regarding patient status. Stating orders placed for [facility], IVF and IV tylenol. Request of attempt to administer medications and if patient refuses, document another note indicating so.

## 2016-07-15 NOTE — Clinical Social Work Placement (Addendum)
   CLINICAL SOCIAL WORK PLACEMENT  NOTE  Date:  07/15/2016  Patient Details  Name: Cory Weber MRN: [id number] Date of Birth: April 17, 1950  Clinical Social Work is seeking post-discharge placement for this patient at the [place] level of care (*CSW will initial, date and re-position this form in  chart as items are completed):  Yes   Patient/family provided with [place] Work Department's list of facilities offering this level of care within the geographic area requested by the patient (or if unable, by the patient's family).  Yes   Patient/family informed of their freedom to choose among providers that offer the needed level of care, that participate in Medicare, Medicaid or managed care program needed by the patient, have an available bed and are willing to accept the patient.  Yes   Patient/family informed of [facility]'s ownership interest in [facility] and [facility], as well as of the fact that they are under no obligation to receive care at these facilities.  PASRR submitted to EDS on       PASRR number received on 07/12/16     Existing PASRR number confirmed on 07/12/16     FL2 transmitted to all facilities in geographic area requested by pt/family on 07/12/16     FL2 transmitted to all facilities within larger geographic area on 07/12/16     Patient informed that his/her managed care company has contracts with or will negotiate with certain facilities, including the following:        Yes   Patient/family informed of bed offers received.  Patient chooses bed at  [facility]   Physician recommends and patient chooses bed at      Patient to be transferred to  [facility] on 07/18/16.  Patient to be transferred to facility by [facility]     Patient family notified on 07/18/16 of transfer.  Name of family member notified:  sister  Kennyth Lose  PHYSICIAN Please prepare priority discharge summary, including medications, Please  prepare prescriptions, Please sign FL2     Additional Comment:    _______________________________________________ Normajean Baxter, LCSW 07/15/2016, 2:37 PM

## 2016-07-15 NOTE — NC FL2 (Signed)
[place] LEVEL OF CARE SCREENING TOOL     IDENTIFICATION  Patient Name: Cory Weber Birthdate: December 22, 1950 Sex: male Admission Date (Current Location): 07/08/2016  [facility] and [place] Number:  Herbalist and Address:  [facility] [place] [address] [place], [place] [address]      Provider Number: [id number]  Attending Physician Name and Address:  Domenic Polite, MD  Relative Name and Phone Number:       Current Level of Care: Hospital Recommended Level of Care: [place] Prior Approval Number:    Date Approved/Denied: 07/12/16 PASRR Number: [id number]  Discharge Plan: SNF    Current Diagnoses: Patient Active Problem List   Diagnosis Date Noted  . Osteomyelitis of foot ([place]) 07/08/2016  . S/P transmetatarsal amputation of foot, right ([place]) 06/26/2016  . Constipation due to opioid therapy 06/15/2016  . Physical deconditioning 06/10/2016  . 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) 06/10/2016  . Chronic diastolic CHF (congestive heart failure) ([place]) 06/10/2016  . Ischemic pain of right foot 06/04/2016  . Ischemia of foot 05/31/2016  . 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 Dyslipidemia associated with type 2 diabetes mellitus ([place]) 01/21/2016  . Uncontrolled type II diabetes with peripheral autonomic neuropathy ([place]) 01/17/2016  . Weight gain 12/20/2015  . Edema of right lower extremity 12/20/2015  . Cough 11/26/2015  . GERD (gastroesophageal reflux disease) 10/08/2015  . Amputation of left lower extremity below knee ([place]) 08/21/2015  . Phantom limb pain ([place])   . Anemia of chronic disease   . BPH (benign prostatic hyperplasia)   . ETOH abuse   . PVD (peripheral vascular disease) ([place])   . Chronic hepatitis C without hepatic coma ([place])   . History of CVA (cerebrovascular accident) without residual deficits   . Hyponatremia   . Chronic kidney disease, stage 3  07/18/2015  . Essential hypertension 07/18/2015  . S/P lumbar spinal fusion 11/25/2013  . Melena 12/03/2011  . Hemiparesthesia 02/03/2011  . Polysubstance abuse 02/03/2011  . Tobacco abuse 02/03/2011    Orientation RESPIRATION BLADDER Height & Weight     Self, Time, Situation, Place  Normal Continent Weight: 190 lb (86.2 kg) Height:  6\' 1"  (185.4 cm)  BEHAVIORAL SYMPTOMS/MOOD NEUROLOGICAL BOWEL NUTRITION STATUS      Continent Diet (See DC Summary)  AMBULATORY STATUS COMMUNICATION OF NEEDS Skin   Limited Assist Verbally Surgical wounds (Right Leg Closed Incision; right Leg Closed Incision Compression Wrap)   Wound Vac                       Personal Care Assistance Level of Assistance  Bathing, Feeding, Dressing Bathing Assistance: Limited assistance Feeding assistance: Independent Dressing Assistance: Limited assistance     Functional Limitations Info  Sight, Hearing, Speech Sight Info: Adequate Hearing Info: Adequate Speech Info: Adequate    SPECIAL CARE FACTORS FREQUENCY  PT (By licensed PT), OT (By licensed OT)     PT Frequency: 5xweek OT Frequency: 5xweek            Contractures      Additional Factors Info  Code Status, Allergies, Insulin Sliding Scale, Isolation Precautions Code Status Info: Full Allergies Info: NKA   Insulin Sliding Scale Info: 15 units 3x's day; 3 units;25 units at bed Isolation Precautions Info: MRSA     Current Medications (07/15/2016):  This is the current hospital active medication list Current Facility-Administered Medications  Medication  Dose Route Frequency Provider Last Rate Last Dose  . acetaminophen (TYLENOL) tablet 650 mg  650 mg Oral Q6H PRN Karmen Bongo, MD   650 mg at 07/15/16 8242   Or  . acetaminophen (TYLENOL) suppository 650 mg  650 mg Rectal Q6H PRN Karmen Bongo, MD      . allopurinol (ZYLOPRIM) tablet 200 mg  200 mg Oral QPC supper Karmen Bongo, MD   200 mg at 07/14/16 1647  . aspirin tablet 325 mg   325 mg Oral Daily Karmen Bongo, MD   325 mg at 07/15/16 3536  . carvedilol (COREG) tablet 3.125 mg  3.125 mg Oral BID WC Karmen Bongo, MD   3.125 mg at 07/15/16 1443  . clopidogrel (PLAVIX) tablet 75 mg  75 mg Oral Daily Waynetta Sandy, MD   75 mg at 07/15/16 1540  . feeding supplement (GLUCERNA SHAKE) (GLUCERNA SHAKE) liquid 237 mL  237 mL Oral BID BM Hosie Poisson, MD   237 mL at 07/14/16 1411  . feeding supplement (PRO-STAT SUGAR FREE 64) liquid 30 mL  30 mL Oral Daily Hosie Poisson, MD   30 mL at 07/15/16 0813  . ferrous sulfate tablet 325 mg  325 mg Oral TID WC Karmen Bongo, MD   325 mg at 07/15/16 0867  . gabapentin (NEURONTIN) tablet 600 mg  600 mg Oral BID Karmen Bongo, MD   600 mg at 07/15/16 6195   And  . gabapentin (NEURONTIN) tablet 1,200 mg  1,200 mg Oral Ivery Quale, MD   1,200 mg at 07/13/16 2156  . insulin aspart (novoLOG) injection 0-15 Units  0-15 Units Subcutaneous TID WC Karmen Bongo, MD   2 Units at 07/14/16 1647  . insulin aspart (novoLOG) injection 3 Units  3 Units Subcutaneous TID WC Hosie Poisson, MD   3 Units at 07/15/16 0811  . insulin glargine (LANTUS) injection 25 Units  25 Units Subcutaneous QHS Karmen Bongo, MD   25 Units at 07/15/16 0015  . lactulose (CHRONULAC) 10 GM/15ML solution 10 g  10 g Oral BID Domenic Polite, MD   10 g at 07/15/16 0813  . lisinopril (PRINIVIL,ZESTRIL) tablet 5 mg  5 mg Oral Daily Karmen Bongo, MD   5 mg at 07/15/16 0932  . magnesium hydroxide (MILK OF MAGNESIA) suspension 30 mL  30 mL Oral Daily PRN Karmen Bongo, MD   30 mL at 07/14/16 0857  . methocarbamol (ROBAXIN) tablet 500 mg  500 mg Oral Q6H PRN Karmen Bongo, MD   500 mg at 07/12/16 0228  . morphine (MS CONTIN) 12 hr tablet 15 mg  15 mg Oral Q12H Karmen Bongo, MD   15 mg at 07/15/16 6712  . ondansetron (ZOFRAN) tablet 4 mg  4 mg Oral Q6H PRN Karmen Bongo, MD       Or  . ondansetron [facility]) injection 4 mg  4 mg Intravenous Q6H PRN  Karmen Bongo, MD      . oxyCODONE (Oxy IR/ROXICODONE) immediate release tablet 10-15 mg  10-15 mg Oral Q4H PRN Karmen Bongo, MD   15 mg at 07/15/16 0827  . polyethylene glycol (MIRALAX / GLYCOLAX) packet 17 g  17 g Oral BID Domenic Polite, MD   17 g at 07/15/16 0813  . senna-docusate (Senokot-S) tablet 1 tablet  1 tablet Oral BID Karmen Bongo, MD   1 tablet at 07/15/16 [phone]  . simvastatin (ZOCOR) tablet 20 mg  20 mg Oral Daily Karmen Bongo, MD   20 mg at 07/15/16 0813  .  tamsulosin (FLOMAX) capsule 0.4 mg  0.4 mg Oral Daily Karmen Bongo, MD   0.4 mg at 07/15/16 0355     Discharge Medications: Please see discharge summary for a list of discharge medications.  Relevant Imaging Results:  Relevant Lab Results:   Additional Information [id number]  Normajean Baxter, LCSW

## 2016-07-16 ENCOUNTER — Telehealth: Payer: Self-pay | Admitting: Vascular Surgery

## 2016-07-16 LAB — CBC
HCT: 29.4 % — ABNORMAL LOW (ref 39.0–52.0)
HEMOGLOBIN: 9.3 g/dL — AB (ref 13.0–17.0)
MCH: 27.8 pg (ref 26.0–34.0)
MCHC: 31.6 g/dL (ref 30.0–36.0)
MCV: 87.8 fL (ref 78.0–100.0)
Platelets: 389 10*3/uL (ref 150–400)
RBC: 3.35 MIL/uL — ABNORMAL LOW (ref 4.22–5.81)
RDW: 14.8 % (ref 11.5–15.5)
WBC: 14.3 10*3/uL — ABNORMAL HIGH (ref 4.0–10.5)

## 2016-07-16 LAB — URINALYSIS, ROUTINE W REFLEX MICROSCOPIC
BILIRUBIN URINE: NEGATIVE
GLUCOSE, UA: 50 mg/dL — AB
HGB URINE DIPSTICK: NEGATIVE
Ketones, ur: NEGATIVE mg/dL
Leukocytes, UA: NEGATIVE
Nitrite: NEGATIVE
Protein, ur: NEGATIVE mg/dL
SPECIFIC GRAVITY, URINE: 1.01 (ref 1.005–1.030)
pH: 7 (ref 5.0–8.0)

## 2016-07-16 LAB — GLUCOSE, CAPILLARY
GLUCOSE-CAPILLARY: 161 mg/dL — AB (ref 65–99)
Glucose-Capillary: 171 mg/dL — ABNORMAL HIGH (ref 65–99)
Glucose-Capillary: 196 mg/dL — ABNORMAL HIGH (ref 65–99)
Glucose-Capillary: 167 mg/dL — ABNORMAL HIGH (ref 65–99)

## 2016-07-16 LAB — BASIC METABOLIC PANEL
Anion gap: 7 (ref 5–15)
BUN: 9 mg/dL (ref 6–20)
CALCIUM: 8.6 mg/dL — AB (ref 8.9–10.3)
CO2: 27 mmol/L (ref 22–32)
CREATININE: 1.02 mg/dL (ref 0.61–1.24)
Chloride: 98 mmol/L — ABNORMAL LOW (ref 101–111)
GFR calc non Af Amer: >60 mL/min (ref >60)
Glucose, Bld: 182 mg/dL — ABNORMAL HIGH (ref 65–99)
Potassium: 4.3 mmol/L (ref 3.5–5.1)
SODIUM: 132 mmol/L — AB (ref 135–145)

## 2016-07-16 MED ORDER — PRO-STAT SUGAR FREE PO LIQD
30.0000 mL | Freq: Two times a day (BID) | ORAL | Status: DC
  Administered 2016-07-16 – 2016-07-18 (×3): 30 mL via ORAL
  Filled 2016-07-16 (×3): qty 30

## 2016-07-16 MED ORDER — GABAPENTIN 300 MG PO CAPS
300.0000 mg | ORAL_CAPSULE | Freq: Three times a day (TID) | ORAL | Status: DC
  Administered 2016-07-16 – 2016-07-18 (×7): 300 mg via ORAL
  Filled 2016-07-16 (×7): qty 1

## 2016-07-16 MED ORDER — GABAPENTIN 600 MG PO TABS: 300.0000 mg | ORAL_TABLET | Freq: Three times a day (TID) | ORAL | Status: DC

## 2016-07-16 NOTE — Progress Notes (Signed)
Nutrition Follow-up  DOCUMENTATION CODES:   Not applicable  INTERVENTION:  Continue Glucerna Shake po BID, each supplement provides 220 kcal and 10 grams of protein.  Provide 30 ml Prostat po BID, each supplement provides 100 kcal and 15 grams of protein.   Encourage adequate PO intake.   NUTRITION DIAGNOSIS:   Increased nutrient needs related to wound healing as evidenced by estimated needs.; ongoing  GOAL:   Patient will meet greater than or equal to 90% of their needs; progressing  MONITOR:   PO intake, Supplement acceptance, Labs, Weight trends, Skin, I & O's  REASON FOR ASSESSMENT:   Consult Wound healing  ASSESSMENT:   66 y.o. male with medical history significant of asthma, BPH, CAD, history of CVA, hepatitis C, hyperlipidemia, hypertension, PVD with left BKA, diabetes, and recent hospitalization 05/31/16-06/04/16 for treatment of ischemia of right foot with gangrene, status post arthrectomy and balloon angioplasty of right SFA with stent, discharged on aspirin and Plavix, who was admitted 06/10/16 for severe right foot pain, was subsequently evaluated by vascular surgery and ultimately underwent a right transmetatarsal amputation on 06/13/16.  He was discharged to [facility] on 5/7, but reports his foot hasn't improved. He was admitted for evaluation of the wound infection. Vascular surgery consulted, MRi of the foot ordered and possible debridement   PROCEDURE (5/31): DELAY PRIMARY CLOSURE FOOT AMPUTATION (Right)  Discharge to SNF canceled yesterday as pt temperature spiked to 102.3. Plans to discharge when fever and the source is resolved. Pt drowsy. Meal completion has been varied from 15-100%. Pt currently has Glucerna shake and Prostat ordered and has been consuming them. RD to increase Prostat to BID to aid in adequate nutrition needs and wound healing. RD to continue to monitor.   Labs and medications reviewed.   Diet Order:  Diet Carb Modified Fluid consistency:  Thin; Room service appropriate? Yes Diet - low sodium heart healthy Diet Carb Modified  Skin:  Wound (see comment) (Incision on R foot and leg)  Last BM:  6/4  Height:   Ht Readings from Last 1 Encounters:  07/08/16 6\' 1"  (1.854 m)    Weight:   Wt Readings from Last 1 Encounters:  07/08/16 190 lb (86.2 kg)    Ideal Body Weight:  78.1 kg (adjusted for L BKA)  BMI:  Body mass index is 25.07 kg/m.  Estimated Nutritional Needs:   Kcal:  2150-2400  Protein:  110-130 grams  Fluid:  2.1 - 2.4 L/day  EDUCATION NEEDS:   No education needs identified at this time  Corrin Parker, MS, RD, LDN Pager # [phone] After hours/ weekend pager # [phone]

## 2016-07-16 NOTE — Telephone Encounter (Signed)
Sched appt 08/09/16 at 1:30. Spoke to pt's sister to confirm appt.

## 2016-07-16 NOTE — Social Work (Addendum)
CSW met with patient and family.   Patient wanted CSW to send out offers to [facility] [place] [facility].  [facility] made a bed offer. CSW called admission staff and left message to confirm.    CSW advised patient of bed offer at [facility] in [place]. Patient has accepted bed offer. CSW called sister Kennyth Lose and advised of same.

## 2016-07-16 NOTE — Progress Notes (Signed)
PROGRESS NOTE    Cory Weber  WPY:[id number] DOB: Jul 31, 1950 DOA: 07/08/2016 PCP: Iona Beard, MD    Brief Narrative:  Cory Weber is a 66 y.o. male with medical history significant of asthma, BPH, CAD, history of CVA, hepatitis C, hyperlipidemia, hypertension, PVD with left BKA, diabetes, and recent hospitalization 05/31/16-06/04/16 for treatment of ischemia of right foot with gangrene, status post arthrectomy and balloon angioplasty of right SFA with stent, discharged on aspirin and Plavix, who was admitted 06/10/16 for severe right foot pain, was subsequently evaluated by vascular surgery and ultimately underwent a right transmetatarsal amputation on 06/13/16.  He was discharged to [facility] on 5/7, but reports his foot hasn't improved. He was admitted for evaluation of the wound amputation breakdown.. Vascular surgery consulted, seen by Dr.Cain underwent debridement of R TMA 5/30, the same evening he had a lot of bleeding and was taking back to the OR by Dr.Cain VVS and Dr.Evans from Podiatry -underwent Lisfranc Amputation and wound vac placement -had urinary retention this admission, foley removed 6/5 for voiding trail, has been having high PVR  -6/4 amdischarged to SNF, but spiked a Temp of 102.3 later evening and hence Dc cancelled  Assessment & Plan:   Diabetic Right foot Wound -status post arthrectomy and balloon angioplasty of right SFA with stent, discharged on aspirin and Plavix in April to SNF, then readmitted and underwent Right transmetatarsal amputation last admission on 06/13/16 -finally admitted now with amputation site wound necrosis -treated with broad spectrum Abx -Vascular consulted, seen by Dr.Cain underwent debridement of R TMA, the same evening he had a lot of bleeding and was taking back to the OR by Dr.Cain VVS and Dr.Evans from Podiatry -underwent Lisfranc Amputation and wound vac placement -Wound healing better,  needs FU with Dr.EVans Podiatry in 1 week for VAC  removal and Dr.Cain Vascular in 2-3weeks  Fever 102.3 overnight on 6/4 -suspect possible UTI-high post void residuals, bladder scans now, check UA and urine Cx -FU blood cx -wound healing well, no pulm symptoms  Hypertension:  - well controlled on home meds    Anemia due to blood loss and chronic disease -s/p 4units PRBC this admission due to profuse post op bleeding after first surgery on 5/30  Diabetes Mellitus:  -continue Lantus, SSI -novolog meal coverage, stable  H/o Chronic diastolic heart failure:  Appears to be compensated.  -diuretics on hold  Urinary retention -now with coude, will d/w staff to find out if this can be removed  Constipation -increase laxatives  DVT prophylaxis: lovenox Code Status: Full Code Family Communication:None at bedside  Disposition Plan: SNF when fevers down and source clear   Consultants:   Vascular surgery.   Procedure Performed:   5/30 Dr.Cain : Revision of right transmetatarsal amputation   5/31: Dr.Evans and Dr.Cain 1. Lisfranc amputation right foot 2. Delayed primary closure right foot 3. Application negative pressure wound VAC   Antimicrobials: vancomycin and zosyn from 5/28   Subjective: -feels tired and sleepy, no cough congestion, no diarrhea  Objective: Vitals:   07/15/16 1800 07/15/16 2052 07/16/16 0016 07/16/16 0500  BP: (!) 154/68 (!) 146/69  133/72  Pulse:  73  76  Resp:  16  16  Temp:  (!) 102.3 F (39.1 C) 98.9 F (37.2 C) 99.8 F (37.7 C)  TempSrc:  Oral Oral Oral  SpO2:  94%  96%  Weight:      Height:        Intake/Output Summary (Last 24 hours) at 07/16/16  1315 Last data filed at 07/16/16 0849  Gross per 24 hour  Intake              890 ml  Output              581 ml  Net              309 ml   Filed Weights   07/08/16 1424  Weight: 86.2 kg (190 lb)    Examination:  Awake but somnolent, Oriented X 3,  HEENT: [place].AT,PERRAL Supple Neck,No JVD,  Lungs: Symmetrical Chest wall  movement, CTAB CVS: RRR,No Gallops,Rubs or new Murmurs Abd: soft, obese, distended, BS present Ext: R foot mid foot amputation with dressing and vac noted, left BKA   Data Reviewed: I have personally reviewed following labs and imaging studies  CBC:  Recent Labs Lab 07/11/16 2251 07/12/16 0659 07/13/16 0633 07/14/16 0008 07/15/16 0448 07/16/16 0602  WBC 9.8 13.2* 13.1*  --  11.9* 14.3*  HGB 7.0* 7.2* 6.8* 8.2* 8.5* 9.3*  HCT 22.6* 22.8* 21.9* 26.4* 27.4* 29.4*  MCV 86.9 86.7 87.6  --  88.4 87.8  PLT 297 309 296  --  333 572   Basic Metabolic Panel:  Recent Labs Lab 07/10/16 0607 07/11/16 0651 07/14/16 0008 07/15/16 0448 07/16/16 0602  NA 133* 134* 132* 133* 132*  K 4.5 4.3 3.8 4.0 4.3  CL 101 100* 101 100* 98*  CO2 27 25 26 27 27   GLUCOSE 158* 128* 140* 115* 182*  BUN 14 10 10 10 9   CREATININE 1.06 1.20 0.96 1.00 1.02  CALCIUM 8.8* 9.0 8.3* 8.4* 8.6*   GFR: Estimated Creatinine Clearance: 83.8 mL/min (by C-G formula based on SCr of 1.02 mg/dL). Liver Function Tests: No results for input(s): AST, ALT, ALKPHOS, BILITOT, PROT, ALBUMIN in the last 168 hours. No results for input(s): LIPASE, AMYLASE in the last 168 hours. No results for input(s): AMMONIA in the last 168 hours. Coagulation Profile: No results for input(s): INR, PROTIME in the last 168 hours. Cardiac Enzymes: No results for input(s): CKTOTAL, CKMB, CKMBINDEX, TROPONINI in the last 168 hours. BNP (last 3 results) No results for input(s): PROBNP in the last 8760 hours. HbA1C: No results for input(s): HGBA1C in the last 72 hours. CBG:  Recent Labs Lab 07/15/16 1717 07/15/16 1836 07/15/16 2058 07/16/16 0648 07/16/16 1244  GLUCAP 120* 129* 186* 171* 161*   Lipid Profile: No results for input(s): CHOL, HDL, LDLCALC, TRIG, CHOLHDL, LDLDIRECT in the last 72 hours. Thyroid Function Tests: No results for input(s): TSH, T4TOTAL, FREET4, T3FREE, THYROIDAB in the last 72 hours. Anemia Panel: No  results for input(s): VITAMINB12, FOLATE, FERRITIN, TIBC, IRON, RETICCTPCT in the last 72 hours. Sepsis Labs: No results for input(s): PROCALCITON, LATICACIDVEN in the last 168 hours.  Recent Results (from the past 240 hour(s))  Blood Cultures x 2 sites     Status: None   Collection Time: 07/08/16 10:33 PM  Result Value Ref Range Status   Specimen Description BLOOD RIGHT HAND  Final   Special Requests IN PEDIATRIC BOTTLE Blood Culture adequate volume  Final   Culture NO GROWTH 5 DAYS  Final   Report Status 07/13/2016 FINAL  Final  Blood Cultures x 2 sites     Status: None   Collection Time: 07/08/16 10:33 PM  Result Value Ref Range Status   Specimen Description BLOOD LEFT HAND  Final   Special Requests IN PEDIATRIC BOTTLE Blood Culture adequate volume  Final   Culture NO  GROWTH 5 DAYS  Final   Report Status 07/13/2016 FINAL  Final  Surgical PCR screen     Status: Abnormal   Collection Time: 07/09/16  8:52 AM  Result Value Ref Range Status   MRSA, PCR NEGATIVE NEGATIVE Final   Staphylococcus aureus POSITIVE (A) NEGATIVE Final    Comment:        The Xpert SA Assay (FDA approved for NASAL specimens in patients over 25 years of age), is one component of a comprehensive surveillance program.  Test performance has been validated by [facility] for patients greater than or equal to 6 year old. It is not intended to diagnose infection nor to guide or monitor treatment.          Radiology Studies: No results found.      Scheduled Meds: . allopurinol  200 mg Oral QPC supper  . aspirin  325 mg Oral Daily  . carvedilol  3.125 mg Oral BID WC  . clopidogrel  75 mg Oral Daily  . feeding supplement (GLUCERNA SHAKE)  237 mL Oral BID BM  . feeding supplement (PRO-STAT SUGAR FREE 64)  30 mL Oral Daily  . ferrous sulfate  325 mg Oral TID WC  . gabapentin  300 mg Oral TID  . insulin aspart  0-15 Units Subcutaneous TID WC  . insulin aspart  3 Units Subcutaneous TID WC  .  insulin glargine  25 Units Subcutaneous QHS  . lactulose  10 g Oral BID  . lisinopril  5 mg Oral Daily  . polyethylene glycol  17 g Oral BID  . senna-docusate  1 tablet Oral BID  . simvastatin  20 mg Oral Daily  . tamsulosin  0.4 mg Oral Daily   Continuous Infusions:    LOS: 8 days    Time spent: 35 minutes.     Domenic Polite, MD [facility] Pager [phone]   If 7PM-7AM, please contact night-coverage [email or web address] Password [email or web address] 07/16/2016, 1:15 PM

## 2016-07-16 NOTE — Telephone Encounter (Signed)
-----   Message from Mena Goes, RN sent at 07/16/2016  2:52 PM EDT ----- Regarding: RE: 3-4 weeks Yeah cancel those, they're old, he's had surgery and just needs postop. Thanks ----- Message ----- From: Georgiann Mccoy Sent: 07/16/2016   2:49 PM To: Mena Goes, RN Subject: RE: 3-4 weeks                                  Pt has ABI, LE Art on 07/25/16 and BCC on 07/31/16. Cxl all appts?  ----- Message ----- From: Mena Goes, RN Sent: 07/16/2016   2:08 PM To: [facility] Admin Pool Subject: 3-4 weeks                                        ----- Message ----- From: Gabriel Earing, PA-C Sent: 07/16/2016   1:10 PM To: Vvs Charge Pool  S/p transmet amp and revision.  F/u with Dr. Donzetta Matters in 3-4 weeks.  Thanks

## 2016-07-17 ENCOUNTER — Inpatient Hospital Stay (HOSPITAL_COMMUNITY): Payer: Medicaid Other

## 2016-07-17 LAB — CBC WITH DIFFERENTIAL/PLATELET
BASOS ABS: 0 10*3/uL (ref 0.0–0.1)
BASOS PCT: 0 %
EOS ABS: 0.2 10*3/uL (ref 0.0–0.7)
EOS PCT: 1 %
HCT: 28.3 % — ABNORMAL LOW (ref 39.0–52.0)
HEMOGLOBIN: 8.9 g/dL — AB (ref 13.0–17.0)
Lymphocytes Relative: 13 %
Lymphs Abs: 1.9 10*3/uL (ref 0.7–4.0)
MCH: 27.9 pg (ref 26.0–34.0)
MCHC: 31.4 g/dL (ref 30.0–36.0)
MCV: 88.7 fL (ref 78.0–100.0)
Monocytes Absolute: 0.9 10*3/uL (ref 0.1–1.0)
Monocytes Relative: 7 %
NEUTROS PCT: 79 %
Neutro Abs: 11 10*3/uL — ABNORMAL HIGH (ref 1.7–7.7)
PLATELETS: 479 10*3/uL — AB (ref 150–400)
RBC: 3.19 MIL/uL — AB (ref 4.22–5.81)
RDW: 14.8 % (ref 11.5–15.5)
WBC: 14 10*3/uL — AB (ref 4.0–10.5)

## 2016-07-17 LAB — GLUCOSE, CAPILLARY
GLUCOSE-CAPILLARY: 153 mg/dL — AB (ref 65–99)
GLUCOSE-CAPILLARY: 149 mg/dL — AB (ref 65–99)
GLUCOSE-CAPILLARY: 150 mg/dL — AB (ref 65–99)
GLUCOSE-CAPILLARY: 171 mg/dL — AB (ref 65–99)

## 2016-07-17 LAB — URINE CULTURE: CULTURE: NO GROWTH

## 2016-07-17 LAB — PROCALCITONIN

## 2016-07-17 MED ORDER — SACCHAROMYCES BOULARDII 250 MG PO CAPS
250.0000 mg | ORAL_CAPSULE | Freq: Two times a day (BID) | ORAL | Status: DC
  Administered 2016-07-17 – 2016-07-18 (×2): 250 mg via ORAL
  Filled 2016-07-17 (×2): qty 1

## 2016-07-17 NOTE — Progress Notes (Signed)
Pt's sisters called this evening requesting information about pt. Informed them that we are not able to give out pt information per HIPPA guidelines/pt confidentiality.  Instructed them that if pt gave consent for them to know his information then staff could talk with them. Encouraged them to come to hospital tomorrow and they could speak with his MD if pt wanted them to know his information.   Sister Kennyth Lose here earlier today at pt's beside, pt had some nausea and little vomitting before she came, and she was aware the nurse had given him antinausea med to help. She voices now that he is too quite, doesn't seem to be his self today, she voices concern that something is wrong.  Again encouraged her to talk with her brother to get information on how he feels and get his permission to talk with his doctor.    Dr Posey Pronto happened to call after phone calls with his sisters Kennyth Lose and [place]), informed of their calls. Dr Posey Pronto called nurse to inquire if pt had vomitted any more or had nausea. Pt had not. Pt ate a late lunch of 1/2 burger and some of his french fries.    Pt informed of his sisters calling to inquire about how he was doing, wanting to talk with the doctor. They had voiced concern that "he wasn't himself today, think something is wrong with him" sister Kennyth Lose stated.  (upon coming into the room pt was eating his peaches and talking/interacting more with me than he had all day. States he feels better. Another RN that has worked with him this week came in at this time also and stated he's like he has been, good mood at this time).   Pt states he just has felt bad today (has been sleeping, and quite, not spoke much to staff, has had loose incont bms today due to multi laxatives received to help get his bowels going, now going a lot, MD aware)    Pt now feeling better. Dr Posey Pronto informed of pt feeling better, and that pt was instructed by nurse to let us know tomorrow when his sisters come if he wants the  doctor to talk with them, give him permission to inform them of his care.

## 2016-07-17 NOTE — Progress Notes (Signed)
[facility] Progress Note  Patient: Cory Weber WUJ:[id number]   PCP: Iona Beard, MD DOB: [birth year]   DOA: 07/08/2016   DOS: 07/17/2016   Date of Service: the patient was seen and examined on 07/17/2016  Subjective: Patient felt nauseated this morning followed by an episode of clinical or vomiting. No abdominal pain. Also had multiple episodes of diarrhea this morning in association with stool softener. No fever no chills. No chest pain or shortness of breath. No headache reported.  Brief hospital course: Cory Weber a 66 y.o.malewith medical history significant of asthma, BPH, CAD, history of CVA, hepatitis C, hyperlipidemia, hypertension, PVD with left BKA, diabetes, and recent hospitalization 05/31/16-06/04/16 for treatment of ischemia of right foot with gangrene, status post arthrectomy and balloon angioplasty of right SFA with stent, discharged on aspirin and Plavix, who was admitted 06/10/16 for severe right foot pain,was subsequently evaluated by vascular surgery and ultimately underwent a right transmetatarsal amputation on 06/13/16.  He was discharged to [facility] on 5/7, but reports his foot hasn't improved. He was admitted for evaluation of the wound amputation breakdown.. Vascular surgery consulted, seen by Dr.Cain underwent debridement of R TMA 5/30, the same evening he had a lot of bleeding and was taking back to the OR by Dr.Cain VVS and Dr.Evans from Podiatry -underwent Lisfranc Amputation and wound vac placement Currently further plan is monitor her diet tolerance.  Assessment and Plan: Diabetic Right foot Wound -status post arthrectomy and balloon angioplasty of right SFA with stent, discharged on aspirin and Plavix in April to SNF, then readmitted and underwent Right transmetatarsal amputation last admission on 06/13/16 -finally admitted now with amputation site wound necrosis -treated with broad spectrum Abx -Vascular consulted, seen by Dr.Cain underwent debridement  of R TMA, the same evening he had a lot of bleeding and was taking back to the OR by Dr.Cain VVS and Dr.Evans from Podiatry -underwent Lisfranc Amputation and wound vac placement -Wound healing better,  needs FU with Dr.EVans Podiatry in 1 week for VAC removal and Dr.Cain Vascular in 2-3weeks  Fever 102.3 overnight on 6/4 -suspect possible UTI-high post void residuals, bladder scans now, check UA and urine Cx -FU blood cx -wound healing well, no pulm symptoms, chest x-ray clear. Mild worsening of leukocytosis also episode of vomiting which is currently resolved.  Hypertension:  - well controlled on home meds    Anemia due to blood loss and chronic disease -s/p 4units PRBC this admission due to profuse post op bleeding after first surgery on 5/30 - Now H&H stable.  Diabetes Mellitus:  -continue Lantus, SSI -novolog meal coverage, stable  H/o Chronic diastolic heart failure:  Appears to be compensated.  -diuretics on hold  Urinary retention - resolved  Constipation -Now having diarrhea, will change to when necessary  Diet: Cardiac diet DVT Prophylaxis: subcutaneous Heparin  Advance goals of care discussion: Full code  Family Communication: no family was present at bedside, at the time of interview.   Disposition:  Discharge to SNF likely tomorrow depending on diet tolerance and culture clearance.  Consultants: Podiatry, vascular surgery Procedures: 07/11/2016: Lisfranc amputation of the right foot and wound VAC placement 07/10/2016 revision of right transmetatarsal amputation  Antibiotics: Anti-infectives    Start     Dose/Rate Route Frequency Ordered Stop   07/13/16 0200  vancomycin (VANCOCIN) IVPB 1000 mg/200 mL premix  Status:  Discontinued     1,000 mg 200 mL/hr over 60 Minutes Intravenous Every 18 hours 07/12/16 1149 07/13/16 1317   07/09/16  0800  vancomycin (VANCOCIN) IVPB 1000 mg/200 mL premix  Status:  Discontinued     1,000 mg 200 mL/hr over 60  Minutes Intravenous Every 12 hours 07/08/16 1904 07/12/16 1149   07/09/16 0200  piperacillin-tazobactam (ZOSYN) IVPB 3.375 g  Status:  Discontinued     3.375 g 12.5 mL/hr over 240 Minutes Intravenous Every 8 hours 07/08/16 1904 07/13/16 1317   07/08/16 1930  vancomycin (VANCOCIN) 1,750 mg in sodium chloride 0.9 % 500 mL IVPB     1,750 mg 250 mL/hr over 120 Minutes Intravenous  Once 07/08/16 1856 07/08/16 2141   07/08/16 1930  piperacillin-tazobactam (ZOSYN) IVPB 3.375 g     3.375 g 100 mL/hr over 30 Minutes Intravenous  Once 07/08/16 1856 07/08/16 2011       Objective: Physical Exam: Vitals:   07/16/16 1814 07/16/16 2120 07/17/16 0820 07/17/16 1500  BP: 131/85  140/71 (!) 138/57  Pulse: 89  93 90  Resp:   20 18  Temp: (!) 100.7 F (38.2 C) 98.4 F (36.9 C) 99.8 F (37.7 C) 98.6 F (37 C)  TempSrc: Oral Oral Oral Axillary  SpO2: 100%  97% 98%  Weight:      Height:        Intake/Output Summary (Last 24 hours) at 07/17/16 1645 Last data filed at 07/17/16 0800  Gross per 24 hour  Intake              260 ml  Output              600 ml  Net             -340 ml   Filed Weights   07/08/16 1424  Weight: 86.2 kg (190 lb)   General: Alert, Awake and Oriented to Time, Place and Person. Appear in moderate distress, affect appropriate Eyes: PERRL, Conjunctiva normal ENT: Oral Mucosa clear moist. Neck: difficult to assess JVD, no Abnormal Mass Or lumps Cardiovascular: S1 and S2 Present, no Murmur, Peripheral Pulses Present Respiratory: normal respiratory effort, Bilateral Air entry equal and Decreased, no use of accessory muscle, bilateralCrackles, no wheezes Abdomen: Bowel Sound present, Soft and no tenderness, no hernia Skin: no redness, no Rash, no induration  Data Reviewed: CBC:  Recent Labs Lab 07/12/16 0659 07/13/16 0633 07/14/16 0008 07/15/16 0448 07/16/16 0602 07/17/16 0825  WBC 13.2* 13.1*  --  11.9* 14.3* 14.0*  NEUTROABS  --   --   --   --   --  11.0*  HGB  7.2* 6.8* 8.2* 8.5* 9.3* 8.9*  HCT 22.8* 21.9* 26.4* 27.4* 29.4* 28.3*  MCV 86.7 87.6  --  88.4 87.8 88.7  PLT 309 296  --  333 389 765*   Basic Metabolic Panel:  Recent Labs Lab 07/11/16 0651 07/14/16 0008 07/15/16 0448 07/16/16 0602  NA 134* 132* 133* 132*  K 4.3 3.8 4.0 4.3  CL 100* 101 100* 98*  CO2 25 26 27 27   GLUCOSE 128* 140* 115* 182*  BUN 10 10 10 9   CREATININE 1.20 0.96 1.00 1.02  CALCIUM 9.0 8.3* 8.4* 8.6*    Liver Function Tests: No results for input(s): AST, ALT, ALKPHOS, BILITOT, PROT, ALBUMIN in the last 168 hours. No results for input(s): LIPASE, AMYLASE in the last 168 hours. No results for input(s): AMMONIA in the last 168 hours. Coagulation Profile: No results for input(s): INR, PROTIME in the last 168 hours. Cardiac Enzymes: No results for input(s): CKTOTAL, CKMB, CKMBINDEX, TROPONINI in the last 168 hours. BNP (  last 3 results) No results for input(s): PROBNP in the last 8760 hours. CBG:  Recent Labs Lab 07/16/16 1244 07/16/16 1715 07/16/16 2042 07/17/16 0647 07/17/16 1159  GLUCAP 161* 196* 167* 153* 149*   Studies: Dg Chest Port 1 View  Result Date: 07/17/2016 CLINICAL DATA:  Shortness of breath today. EXAM: PORTABLE CHEST 1 VIEW COMPARISON:  PA and lateral chest 05/31/2016.  CT chest 11/16/2015. FINDINGS: The lungs are clear. Heart size is normal. No pneumothorax or pleural effusion. Aortic atherosclerosis is noted. No acute bony abnormality. IMPRESSION: No acute disease. Atherosclerosis. Electronically Signed   By: Inge Rise M.D.   On: 07/17/2016 10:30    Scheduled Meds: . allopurinol  200 mg Oral QPC supper  . aspirin  325 mg Oral Daily  . carvedilol  3.125 mg Oral BID WC  . clopidogrel  75 mg Oral Daily  . feeding supplement (GLUCERNA SHAKE)  237 mL Oral BID BM  . feeding supplement (PRO-STAT SUGAR FREE 64)  30 mL Oral BID  . ferrous sulfate  325 mg Oral TID WC  . gabapentin  300 mg Oral TID  . insulin aspart  0-15 Units  Subcutaneous TID WC  . insulin aspart  3 Units Subcutaneous TID WC  . insulin glargine  25 Units Subcutaneous QHS  . lactulose  10 g Oral BID  . lisinopril  5 mg Oral Daily  . simvastatin  20 mg Oral Daily  . tamsulosin  0.4 mg Oral Daily   Continuous Infusions: PRN Meds: acetaminophen **OR** acetaminophen, magnesium hydroxide, methocarbamol, ondansetron **OR** ondansetron (ZOFRAN) IV, oxyCODONE  Time spent: 35 minutes  Author: Berle Mull, MD [facility] Pager: [phone] 07/17/2016 4:45 PM  If 7PM-7AM, please contact night-coverage at [email or web address], password [facility]

## 2016-07-18 LAB — CBC WITH DIFFERENTIAL/PLATELET
BASOS ABS: 0 10*3/uL (ref 0.0–0.1)
Basophils Relative: 0 %
EOS ABS: 0.1 10*3/uL (ref 0.0–0.7)
Eosinophils Relative: 1 %
HCT: 27.6 % — ABNORMAL LOW (ref 39.0–52.0)
HEMOGLOBIN: 8.7 g/dL — AB (ref 13.0–17.0)
LYMPHS ABS: 1.9 10*3/uL (ref 0.7–4.0)
LYMPHS PCT: 13 %
MCH: 27.4 pg (ref 26.0–34.0)
MCHC: 31.5 g/dL (ref 30.0–36.0)
MCV: 86.8 fL (ref 78.0–100.0)
Monocytes Absolute: 1.2 10*3/uL — ABNORMAL HIGH (ref 0.1–1.0)
Monocytes Relative: 8 %
NEUTROS PCT: 78 %
Neutro Abs: 11.7 10*3/uL — ABNORMAL HIGH (ref 1.7–7.7)
PLATELETS: 521 10*3/uL — AB (ref 150–400)
RBC: 3.18 MIL/uL — AB (ref 4.22–5.81)
RDW: 14.4 % (ref 11.5–15.5)
WBC: 14.9 10*3/uL — AB (ref 4.0–10.5)

## 2016-07-18 LAB — COMPREHENSIVE METABOLIC PANEL
ALK PHOS: 71 U/L (ref 38–126)
ALT: 31 U/L (ref 17–63)
AST: 36 U/L (ref 15–41)
Albumin: 2.4 g/dL — ABNORMAL LOW (ref 3.5–5.0)
Anion gap: 10 (ref 5–15)
BUN: 8 mg/dL (ref 6–20)
CALCIUM: 8.6 mg/dL — AB (ref 8.9–10.3)
CHLORIDE: 100 mmol/L — AB (ref 101–111)
CO2: 22 mmol/L (ref 22–32)
CREATININE: 0.9 mg/dL (ref 0.61–1.24)
GFR calc non Af Amer: >60 mL/min (ref >60)
Glucose, Bld: 171 mg/dL — ABNORMAL HIGH (ref 65–99)
Potassium: 4 mmol/L (ref 3.5–5.1)
SODIUM: 132 mmol/L — AB (ref 135–145)
Total Bilirubin: 0.4 mg/dL (ref 0.3–1.2)
Total Protein: 8.1 g/dL (ref 6.5–8.1)

## 2016-07-18 LAB — GLUCOSE, CAPILLARY
GLUCOSE-CAPILLARY: 165 mg/dL — AB (ref 65–99)
Glucose-Capillary: 157 mg/dL — ABNORMAL HIGH (ref 65–99)

## 2016-07-18 LAB — LACTIC ACID, PLASMA: Lactic Acid, Venous: 0.7 mmol/L (ref 0.5–1.9)

## 2016-07-18 LAB — MAGNESIUM: MAGNESIUM: 2.1 mg/dL (ref 1.7–2.4)

## 2016-07-18 MED ORDER — PRO-STAT SUGAR FREE PO LIQD: 30.0000 mL | Freq: Two times a day (BID) | ORAL | 0 refills | Status: DC

## 2016-07-18 MED ORDER — LEVOFLOXACIN 750 MG PO TABS: 750.0000 mg | ORAL_TABLET | Freq: Every day | ORAL | 0 refills | Status: AC

## 2016-07-18 MED ORDER — GABAPENTIN 300 MG PO CAPS: 300.0000 mg | ORAL_CAPSULE | Freq: Three times a day (TID) | ORAL | 0 refills | Status: DC

## 2016-07-18 MED ORDER — SACCHAROMYCES BOULARDII 250 MG PO CAPS: 250.0000 mg | ORAL_CAPSULE | Freq: Two times a day (BID) | ORAL | 0 refills | Status: DC

## 2016-07-18 NOTE — Social Work (Addendum)
CSW calle Debbie at [facility] about the patient accepting bed offer. She advised that since they have changed management, she is following up today if they accept medicaid.  She accepted patient prematurely and will confirm after she discuss.  CSW will await her call back as patient would like to to go to [facility] to be near family support.   CSW called Debbie at [facility] to f/u and she indicated that they accepted patient and he will have to turn over his monthly income, patient aware of same. Jackelyn Poling indicated she will call CSW when to set up DC.

## 2016-07-18 NOTE — Progress Notes (Signed)
Patient discharge instructions reviewed with patient. Patient verbalizes understanding. Patient belongings with transport. Transport is taking patient to SNF. Patient's sister is coming to take the flowers that are in patient's room. Patient is not in distress.

## 2016-07-18 NOTE — Progress Notes (Signed)
RN attempted to give report to SNF. 

## 2016-07-18 NOTE — Social Work (Addendum)
Clinical Social Worker facilitated patient discharge including contacting patient family and facility to confirm patient discharge plans.  Clinical information faxed to facility and family agreeable with plan.  CSW arranged ambulance transport via [facility] to [facility] in [place].  RN to call [phone]  Armando Gang, at [place] nurse report prior to discharge.  Clinical Social Worker will sign off for now as social work intervention is no longer needed. Please consult [place] again if new need arises.  Elissa Hefty, LCSW Clinical Social Worker [phone]

## 2016-07-18 NOTE — Progress Notes (Signed)
RN changed wound vac canister.

## 2016-07-18 NOTE — Discharge Summary (Signed)
[facility] Discharge Summary   Patient: Cory Weber FTD:[id number]   PCP: Iona Beard, MD DOB: 1951-01-13   Date of admission: 07/08/2016   Date of discharge:  07/18/2016    Discharge Diagnoses:  Principal Problem:   Osteomyelitis of foot ([place]) Active Problems:   Essential hypertension   Anemia of chronic disease   PVD (peripheral vascular disease) ([place])   Amputation of left lower extremity below knee ([place])   Dyslipidemia associated with type 2 diabetes mellitus ([place])   Ischemia of foot   Chronic diastolic CHF (congestive heart failure) ([place])   Constipation due to opioid therapy   S/P transmetatarsal amputation of foot, right ([place])  Admitted From: home Disposition:  SNF  Recommendations for Outpatient Follow-up:  1. Please follow up with PCP in 1 week and get CBC checked. 2. Follow up with podiatry Dr Amalia Hailey for wound vac removal in 1 week    Contact information for follow-up providers    Iona Beard, MD. Schedule an appointment as soon as possible for a visit in 1 week(s).   Specialty:  Family Medicine Contact information: [address] [place] [address] [phone]        Edrick Kins, DPM. Schedule an appointment as soon as possible for a visit in 1 week(s).   Specialty:  Podiatry Why:  for [facility] Removal Contact information: [address] [place] [address] [phone]        Waynetta Sandy, MD Follow up in 4 week(s).   Specialties:  Vascular Surgery, Cardiology Why:  Office will call you to arrange your appt (sent) Contact information: [place] [address] [phone]            Contact information for after-discharge care    [facility] Follow up.   Specialty:  [place] information: [place] [phone]                 Diet recommendation: cardiac diet  Activity: The patient is  advised to gradually reintroduce usual activities.  Discharge Condition: good  Code Status: full code  History of present illness: As per the H and P dictated on admission, "Cory Weber is a 66 y.o. male with medical history significant of asthma, BPH, CAD, history of CVA, hepatitis C, hyperlipidemia, hypertension, PVD with left BKA, diabetes, and recent hospitalization 05/31/16-06/04/16 for treatment of ischemia of right foot with gangrene, status post arthrectomy and balloon angioplasty of right SFA with stent, discharged on aspirin and Plavix, who was admitted 06/10/16 for severe right foot pain unrelieved by oral opiates, was subsequently evaluated by vascular surgery and ultimately underwent a right transmetatarsal amputation on 06/13/16.  He was sent to [facility] on 5/7 and it hasn't appeared to get well since discharge.  Foot is "self-explanatory."  It isn't really bothering him.  "They have been trying to mask the injuiry with pain pills and that ain't the ticket".  The color was off, odor present.    No fevers.  Feels good, just hungry.  He reports that his sugars have been high the last few days but were good otherwise since discharge.  BPs have not been as well controlled."  Hospital Course:  Summary of his active problems in the hospital is as following. Diabetic Right foot Wound -status post arthrectomy and balloon angioplasty of right SFA with stent, discharged on aspirin and Plavix in April  to SNF, then readmitted and underwent Right transmetatarsal amputation last admission on 06/13/16 -finally admitted now with amputation site wound necrosis -treated with broad spectrum Abx -Vascular consulted, seen by Dr.Cain underwent debridement of R TMA, the same evening he had a lot of bleeding and was taking back to the OR by Dr.Cain VVS and Dr.Evans from Podiatry -underwent Lisfranc Amputation and wound vac placement -Wound healing better, needs FU with Dr.Evans Podiatry in 1 week for VAC  removal and Dr.Cain Vascular in 2-3weeks  Fever 102.3 overnight on 6/4 Aspiration pneumonia with episode of vomiting.  No growth till date in blood cx Urine culture negative.  CXR negative as well but pt has bilateral crackles on examination, possible aspiration with vomiting episode. -wound healing well, no pulm symptoms I will empirically treat with levofloxacin for 5 days.   Hypertension:  - well controlled on home meds  Anemia due to blood loss and chronic disease -s/p 4units PRBC this admission due to profuse post op bleeding after first surgery on 5/30 - Now H&H stable.  Diabetes Mellitus:  -continue home regimen -novolog meal coverage, stable  H/o Chronic diastolic heart failure:  Appears to be compensated.  -diuretics resumed on discharge.   Urinary retention - resolved  Constipation -Now having diarrhea, will change to when necessary  All other chronic medical condition were stable during the hospitalization.  Patient was seen by physical therapy, who recommended SNF, which was arranged by Education officer, museum and case Freight forwarder. On the day of the discharge the patient's vitals were stable, and no other acute medical condition were reported by patient. the patient was felt safe to be discharge at SNF with therapt.  Procedures and Results: 5/30 Dr.Cain : Revision of right transmetatarsal amputation  5/31: Dr.Evans and Dr.Cain 1. Lisfranc amputation right foot 2. Delayed primary closure right foot 3. Application negative pressure wound VAC   Consultations:  Vascular  Podiatry  DISCHARGE MEDICATION: Current Discharge Medication List    START taking these medications   Details  Amino Acids-Protein Hydrolys (FEEDING SUPPLEMENT, PRO-STAT SUGAR FREE 64,) LIQD Take 30 mLs by mouth 2 (two) times daily. Qty: 900 mL, Refills: 0    gabapentin (NEURONTIN) 300 MG capsule Take 1 capsule (300 mg total) by mouth 3 (three) times daily. Qty: 90 capsule, Refills: 0      levofloxacin (LEVAQUIN) 750 MG tablet Take 1 tablet (750 mg total) by mouth daily. Qty: 5 tablet, Refills: 0    saccharomyces boulardii (FLORASTOR) 250 MG capsule Take 1 capsule (250 mg total) by mouth 2 (two) times daily. Qty: 30 capsule, Refills: 0      CONTINUE these medications which have CHANGED   Details  gabapentin (NEURONTIN) 600 MG tablet Take 1 tablet (600 mg total) by mouth 3 (three) times daily.    insulin aspart (NOVOLOG) 100 UNIT/ML injection Inject 5 Units into the skin 3 (three) times daily after meals.    insulin glargine (LANTUS) 100 UNIT/ML injection Inject 0.25 mLs (25 Units total) into the skin at bedtime.    morphine (MS CONTIN) 15 MG 12 hr tablet Take 1 tablet (15 mg total) by mouth every 12 (twelve) hours. Qty: 20 tablet, Refills: 0    Oxycodone HCl 10 MG TABS Take 1-1.5 tablets (10-15 mg total) by mouth every 4 (four) hours as needed. Qty: 20 tablet, Refills: 0    polyethylene glycol (MIRALAX / GLYCOLAX) packet Take 17 g by mouth 2 (two) times daily. Qty: 14 each, Refills: 0  CONTINUE these medications which have NOT CHANGED   Details  allopurinol (ZYLOPRIM) 100 MG tablet Take 200 mg by mouth every evening.    aspirin EC 81 MG tablet Take 81 mg by mouth daily.    carvedilol (COREG) 3.125 MG tablet Take 1 tablet (3.125 mg total) by mouth 2 (two) times daily with a meal. Qty: 60 tablet, Refills: 0    clopidogrel (PLAVIX) 75 MG tablet Take 1 tablet (75 mg total) by mouth daily with breakfast. Qty: 30 tablet, Refills: 0    ferrous sulfate 325 (65 FE) MG tablet Take 325 mg by mouth 3 (three) times daily with meals.    lisinopril (PRINIVIL,ZESTRIL) 5 MG tablet Take 5 mg by mouth daily.     magnesium hydroxide (MILK OF MAGNESIA) 400 MG/5ML suspension Take 30 mLs by mouth daily as needed for mild constipation.    metFORMIN (GLUCOPHAGE) 500 MG tablet Take 500 mg by mouth daily with breakfast.    senna-docusate (SENOKOT-S) 8.6-50 MG tablet Take 1  tablet by mouth 2 (two) times daily.    simvastatin (ZOCOR) 20 MG tablet Take 20 mg by mouth daily.     tamsulosin (FLOMAX) 0.4 MG CAPS capsule Take 1 capsule (0.4 mg total) by mouth daily. Qty: 30 capsule, Refills: 0    torsemide (DEMADEX) 20 MG tablet Take 20 mg by mouth daily.       STOP taking these medications     methocarbamol (ROBAXIN) 500 MG tablet      cephALEXin (KEFLEX) 500 MG capsule      aspirin 325 MG tablet        Allergies  Allergen Reactions  . No Known Allergies    Discharge Instructions    Diet - low sodium heart healthy    Complete by:  As directed    Diet - low sodium heart healthy    Complete by:  As directed    Diet Carb Modified    Complete by:  As directed    Discharge instructions    Complete by:  As directed    It is important that you read following instructions as well as go over your medication list with RN to help you understand your care after this hospitalization.  Discharge Instructions: Please follow-up with PCP in one week  Please request your primary care physician to go over all Hospital Tests and Procedure/Radiological results at the follow up,  Please get all Hospital records sent to your PCP by signing hospital release before you go home.   Do not drive, operating heavy machinery, perform activities at heights, swimming or participation in water activities or provide baby sitting services; until you have been seen by Primary Care Physician or a Neurologist and advised to do so again. Do not take more than prescribed Pain, Sleep and Anxiety Medications. You were cared for by a hospitalist during your hospital stay. If you have any questions about your discharge medications or the care you received while you were in the hospital after you are discharged, you can call the unit and ask to speak with the hospitalist on call if the hospitalist that took care of you is not available.  Once you are discharged, your primary care physician  will handle any further medical issues. Please note that NO REFILLS for any discharge medications will be authorized once you are discharged, as it is imperative that you return to your primary care physician (or establish a relationship with a primary care physician if you do not  have one) for your aftercare needs so that they can reassess your need for medications and monitor your lab values. You Must read complete instructions/literature along with all the possible adverse reactions/side effects for all the Medicines you take and that have been prescribed to you. Take any new Medicines after you have completely understood and accept all the possible adverse reactions/side effects. Wear Seat belts while driving. If you have smoked or chewed Tobacco in the last 2 yrs please stop smoking and/or stop any Recreational drug use.   Increase activity slowly    Complete by:  As directed    Increase activity slowly    Complete by:  As directed      Discharge Exam: Filed Weights   07/08/16 1424  Weight: 86.2 kg (190 lb)   Vitals:   07/18/16 0413 07/18/16 0627  BP: (!) 155/77 (!) 142/65  Pulse: (!) 106 88  Resp: 18 16  Temp: (!) 100.8 F (38.2 C) 99.6 F (37.6 C)   General: Appear in no distress, no Rash; Oral Mucosa moist. Cardiovascular: S1 and S2 Present, no Murmur, no JVD Respiratory: Bilateral Air entry present and bilateral Crackles, no wheezes Abdomen: Bowel Sound present, Soft and no tenderness Extremities: no Pedal edema, left BKA, right foot amputation Neurology: Grossly no focal neuro deficit.  The results of significant diagnostics from this hospitalization (including imaging, microbiology, ancillary and laboratory) are listed below for reference.    Significant Diagnostic Studies: Dg Chest Port 1 View  Result Date: 07/17/2016 CLINICAL DATA:  Shortness of breath today. EXAM: PORTABLE CHEST 1 VIEW COMPARISON:  PA and lateral chest 05/31/2016.  CT chest 11/16/2015. FINDINGS: The  lungs are clear. Heart size is normal. No pneumothorax or pleural effusion. Aortic atherosclerosis is noted. No acute bony abnormality. IMPRESSION: No acute disease. Atherosclerosis. Electronically Signed   By: Inge Rise M.D.   On: 07/17/2016 10:30   Dg Foot Complete Right  Result Date: 07/12/2016 CLINICAL DATA:  Status post amputation.  Diabetes mellitus. EXAM: RIGHT FOOT COMPLETE - 3+ VIEW COMPARISON:  Jul 08, 2016 FINDINGS: , oblique, and lateral views were obtained. There has been interval amputation of the remaining proximal metatarsals. No bone is seen distal to the distal tarsal row. There is soft tissue air within the stump consistent with recent surgery. There is no fracture or dislocation. No bony destruction is seen currently. Areas of arterial vascular calcification consistent with known diabetes mellitus. IMPRESSION: There has now been removal of remaining proximal metatarsals. Soft tissue air in the stomach is felt to be of postoperative etiology. No fracture or dislocation in remaining bones. No erosive change or bony destruction evident currently. Extensive arterial vascular calcification is consistent with diabetes mellitus. Electronically Signed   By: Lowella Grip III M.D.   On: 07/12/2016 10:06   Dg Foot Complete Right  Result Date: 07/08/2016 CLINICAL DATA:  Right foot infection. EXAM: RIGHT FOOT COMPLETE - 3+ VIEW COMPARISON:  06/27/2016. FINDINGS: Postsurgical changes of right forefoot amputation at the level of the proximal metatarsal bones identified. There is a focal area of bone demineralization involving the distal tip of the remaining first metatarsal bone which is new from previous exam and may reflect underlying osteomyelitis. Soft tissue swelling overlying the stump is identified. IMPRESSION: 1. Postop change from right forefoot amputation. 2. Focal area of new bone demineralization involving the tip of the remaining first metatarsal bone which may represent  superimposed osteomyelitis. Electronically Signed   By: Kerby Moors M.D.   On:  07/08/2016 15:28   Dg Foot Complete Right  Result Date: 06/27/2016 CLINICAL DATA:  Valve postop appearance following amputation EXAM: RIGHT FOOT COMPLETE - 3+ VIEW COMPARISON:  06/10/16 FINDINGS: Transmetatarsal amputation is noted. A few small bony fragments are noted in the surgical bed. No erosive changes are seen. No other focal abnormality is noted. IMPRESSION: Postsurgical changes as described. Electronically Signed   By: Inez Catalina M.D.   On: 06/27/2016 13:34    Microbiology: Recent Results (from the past 240 hour(s))  Blood Cultures x 2 sites     Status: None   Collection Time: 07/08/16 10:33 PM  Result Value Ref Range Status   Specimen Description BLOOD RIGHT HAND  Final   Special Requests IN PEDIATRIC BOTTLE Blood Culture adequate volume  Final   Culture NO GROWTH 5 DAYS  Final   Report Status 07/13/2016 FINAL  Final  Blood Cultures x 2 sites     Status: None   Collection Time: 07/08/16 10:33 PM  Result Value Ref Range Status   Specimen Description BLOOD LEFT HAND  Final   Special Requests IN PEDIATRIC BOTTLE Blood Culture adequate volume  Final   Culture NO GROWTH 5 DAYS  Final   Report Status 07/13/2016 FINAL  Final  Surgical PCR screen     Status: Abnormal   Collection Time: 07/09/16  8:52 AM  Result Value Ref Range Status   MRSA, PCR NEGATIVE NEGATIVE Final   Staphylococcus aureus POSITIVE (A) NEGATIVE Final    Comment:        The Xpert SA Assay (FDA approved for NASAL specimens in patients over 46 years of age), is one component of a comprehensive surveillance program.  Test performance has been validated by [facility] for patients greater than or equal to 19 year old. It is not intended to diagnose infection nor to guide or monitor treatment.   Culture, blood (Routine X 2) w Reflex to ID Panel     Status: None (Preliminary result)   Collection Time: 07/15/16 11:38 PM  Result  Value Ref Range Status   Specimen Description BLOOD LEFT ARM  Final   Special Requests   Final    BOTTLES DRAWN AEROBIC AND ANAEROBIC Blood Culture adequate volume   Culture NO GROWTH 1 DAY  Final   Report Status PENDING  Incomplete  Culture, blood (Routine X 2) w Reflex to ID Panel     Status: None (Preliminary result)   Collection Time: 07/15/16 11:43 PM  Result Value Ref Range Status   Specimen Description BLOOD LEFT HAND  Final   Special Requests   Final    BOTTLES DRAWN AEROBIC AND ANAEROBIC Blood Culture adequate volume   Culture NO GROWTH 1 DAY  Final   Report Status PENDING  Incomplete  Culture, Urine     Status: None   Collection Time: 07/16/16  4:22 PM  Result Value Ref Range Status   Specimen Description URINE, CLEAN CATCH  Final   Special Requests NONE  Final   Culture NO GROWTH  Final   Report Status 07/17/2016 FINAL  Final     Labs: CBC:  Recent Labs Lab 07/13/16 0633 07/14/16 0008 07/15/16 0448 07/16/16 0602 07/17/16 0825 07/18/16 0431  WBC 13.1*  --  11.9* 14.3* 14.0* 14.9*  NEUTROABS  --   --   --   --  11.0* 11.7*  HGB 6.8* 8.2* 8.5* 9.3* 8.9* 8.7*  HCT 21.9* 26.4* 27.4* 29.4* 28.3* 27.6*  MCV 87.6  --  88.4  87.8 88.7 86.8  PLT 296  --  333 389 479* 409*   Basic Metabolic Panel:  Recent Labs Lab 07/14/16 0008 07/15/16 0448 07/16/16 0602 07/18/16 0431  NA 132* 133* 132* 132*  K 3.8 4.0 4.3 4.0  CL 101 100* 98* 100*  CO2 26 27 27 22   GLUCOSE 140* 115* 182* 171*  BUN 10 10 9 8   CREATININE 0.96 1.00 1.02 0.90  CALCIUM 8.3* 8.4* 8.6* 8.6*  MG  --   --   --  2.1   Liver Function Tests:  Recent Labs Lab 07/18/16 0431  AST 36  ALT 31  ALKPHOS 71  BILITOT 0.4  PROT 8.1  ALBUMIN 2.4*   No results for input(s): LIPASE, AMYLASE in the last 168 hours. No results for input(s): AMMONIA in the last 168 hours. Cardiac Enzymes: No results for input(s): CKTOTAL, CKMB, CKMBINDEX, TROPONINI in the last 168 hours. BNP (last 3 results)  Recent  Labs  12/22/15 1551  BNP 52.4   CBG:  Recent Labs Lab 07/17/16 0647 07/17/16 1159 07/17/16 1655 07/17/16 2131 07/18/16 0631  GLUCAP 153* 149* 150* 171* 165*   Time spent: 35 minutes  Signed:  Darion Milewski  [facility]  07/18/2016  , 10:01 AM

## 2016-07-18 NOTE — Progress Notes (Signed)
RN attempted to give report to SNF 3 times.

## 2016-07-18 NOTE — Progress Notes (Signed)
Pharmacy Antibiotic Note  Cory Weber is a 66 y.o. male admitted on 07/08/2016 with pneumonia.  Pharmacy has been consulted for Levaquin dosing.  Plan: MD dosed Levaquin at 750mg  po daily which is ok for renal function.  Will discontinue consult as patient discharging today and orders in place by MD.   Height: 6\' 1"  (185.4 cm) Weight: 190 lb (86.2 kg) IBW/kg (Calculated) : 79.9  Temp (24hrs), Avg:99.8 F (37.7 C), Min:98.6 F (37 C), Max:100.8 F (38.2 C)   Recent Labs Lab 07/12/16 0659 07/13/16 0633 07/14/16 0008 07/15/16 0448 07/16/16 0602 07/17/16 0825 07/18/16 0431  WBC 13.2* 13.1*  --  11.9* 14.3* 14.0* 14.9*  CREATININE  --   --  0.96 1.00 1.02  --  0.90  LATICACIDVEN  --   --   --   --   --   --  0.7  VANCOTROUGH 24*  --   --   --   --   --   --     Estimated Creatinine Clearance: 94.9 mL/min (by C-G formula based on SCr of 0.9 mg/dL).    Allergies  Allergen Reactions  . No Known Allergies     Antimicrobials this admission: Vanc 5/28 >>6/2 Zosyn 5/28 >>6/2 LVQ po 6/7 >>  Dose adjustments this admission:   Microbiology results: 5/28 BCx - negative 5/29 surgical PCR - MSSA+ 6/6 Blood >> 6/6 Urine - negative  Thank you for allowing pharmacy to be a part of this patient's care.  Brain Hilts  Clinical phone 07/18/2016 until 3:30 PM - #[phone] After hours, please call [phone] 07/18/2016 10:12 AM

## 2016-07-21 LAB — CULTURE, BLOOD (ROUTINE X 2)
Culture: NO GROWTH
Culture: NO GROWTH
Special Requests: ADEQUATE
Special Requests: ADEQUATE

## 2016-07-23 ENCOUNTER — Ambulatory Visit (INDEPENDENT_AMBULATORY_CARE_PROVIDER_SITE_OTHER): Payer: Self-pay | Admitting: Podiatry

## 2016-07-23 DIAGNOSIS — Z9889 Other specified postprocedural states: Secondary | ICD-10-CM

## 2016-07-25 ENCOUNTER — Encounter (HOSPITAL_COMMUNITY): Payer: Medicaid Other

## 2016-07-26 ENCOUNTER — Encounter: Payer: Self-pay | Admitting: Vascular Surgery

## 2016-07-31 ENCOUNTER — Encounter: Payer: Medicaid Other | Admitting: Vascular Surgery

## 2016-08-01 NOTE — Progress Notes (Signed)
   Subjective:  66 year old male with a history of peripheral vascular disease presents today for follow-up evaluation of a right midfoot amputation. Patient states that he is doing well and denies any complaints at this time. Date of surgery 07/11/2016.   Objective/Physical Exam Amputation site appears stable at the moment. There is some peri-incisional maceration noted to the amputation stump however the incisions are somewhat coapted with staples intact. No malodor noted. No sign of infectious process noted.  Assessment: 1. s/p right Lisfranc foot amputation. DOS: 07/11/16.  Plan of Care:  1. Patient was evaluated. Today dressings were changed 2. Half-inch Steri-Strips were applied prior to dressing application. Patient is to resume follow-up and management under the care of Dr. Servando Snare, VVS.  3. Return to clinic as needed  Edrick Kins, DPM [facility]  Dr. Edrick Kins, [place] [address]                                        [place], [place] [address]                Office [phone]  Fax [phone]

## 2016-08-09 ENCOUNTER — Ambulatory Visit (INDEPENDENT_AMBULATORY_CARE_PROVIDER_SITE_OTHER): Payer: Self-pay | Admitting: Vascular Surgery

## 2016-08-09 ENCOUNTER — Encounter: Payer: Self-pay | Admitting: Vascular Surgery

## 2016-08-09 VITALS — BP 108/65 | HR 82 | Temp 98.4°F | Resp 18 | Ht 73.0 in | Wt 217.0 lb

## 2016-08-09 DIAGNOSIS — I7025 Atherosclerosis of native arteries of other extremities with ulceration: Secondary | ICD-10-CM

## 2016-08-09 NOTE — Progress Notes (Signed)
Patient ID: Cory Weber, male   DOB: 11-21-1950, 66 y.o.   MRN: [id number]  Reason for Consult: Routine Post Op   Referred by Cory Beard, MD  Subjective:     HPI:  Cory Weber is a 66 y.o. male status post proximal right foot amputation. He presents today with drainage and Steri-Strips that are falling off and staples as well. He is not having any fevers has been off of the foot since the amputation. He is relatively discouraged by his progression.  Past Medical History:  Diagnosis Date  . Anemia   . Asthma   . BPH (benign prostatic hyperplasia)   . Cellulitis and abscess of foot 07/2015  . Chronic kidney disease    CKD stage 3  . CVA (cerebral vascular accident) ([place]) 2015   denies residual on 08/15/2015  . Hepatitis C    states he was diagnosed in 2007 or 2007 while living in [place], [place]  . Hyperlipidemia   . Hypertension   . Ischemia of foot 05/31/2016  . Peripheral neuropathy   . Pneumonia X 1  . PVD (peripheral vascular disease) ([place])   . Type II diabetes mellitus ([place])   . Wet gangrene ([place]) 06/10/2016   Family History  Problem Relation Age of Onset  . Breast cancer Mother   . Heart failure Mother   . Diabetes Father   . Hypertension Father   . Heart disease Father   . Hyperlipidemia Father   . Diabetes Sister   . Hypertension Sister   . Breast cancer Sister   . Colon cancer Neg Hx   . Liver disease Neg Hx    Past Surgical History:  Procedure Laterality Date  . ABDOMINAL AORTOGRAM N/A 06/03/2016   Procedure: Abdominal Aortogram;  Surgeon: Waynetta Sandy, MD;  Location: [facility];  Service: Cardiovascular;  Laterality: N/A;  . AMPUTATION Left 08/16/2015   Procedure: LEFT BELOW THE KNEE AMPUTATION ;  Surgeon: Serafina Mitchell, MD;  Location: [facility];  Service: Vascular;  Laterality: Left;  . BACK SURGERY    . BELOW KNEE LEG AMPUTATION Left 08/16/2015  . BIOPSY N/A 09/03/2012   Procedure: BIOPSY;  Surgeon: Daneil Dolin, MD;   Location: [facility];  Service: Endoscopy;  Laterality: N/A;  gastric and gastric mucosa  . BIOPSY N/A 12/03/2012   Procedure: BIOPSY;  Surgeon: Daneil Dolin, MD;  Location: [facility];  Service: Endoscopy;  Laterality: N/A;  . COLONOSCOPY WITH PROPOFOL N/A 09/03/2012   UDJ:SHFWYOV polyp-removed as outlined above. Prominent internal hemorrhoids. Tubular adenoma  . ESOPHAGOGASTRODUODENOSCOPY (EGD) WITH PROPOFOL N/A 09/03/2012   ZCH:YIFOYD hernia. Gastric diverticulum. Gastric ulcers with associated erosions. Duodenal erosions. Status post gastric biopsy. H.PYLORI gastritis   . ESOPHAGOGASTRODUODENOSCOPY (EGD) WITH PROPOFOL N/A 12/03/2012   Dr. Gala Romney: gastric diverticulum, gastric erosions and scar. Previously noted gastric ulcer completed healed. Biopsy without H.pylori.   . ESOPHAGOGASTRODUODENOSCOPY (EGD) WITH PROPOFOL N/A 01/23/2016   Procedure: ESOPHAGOGASTRODUODENOSCOPY (EGD) WITH PROPOFOL;  Surgeon: Doran Stabler, MD;  [facility];  Service: Gastroenterology;  Laterality: N/A;  . I&D EXTREMITY Right 07/11/2016   Procedure: DELAY PRIMARY CLOSURE FOOT AMPUTATION;  Surgeon: Edrick Kins, DPM;  Location: [facility];  Service: Podiatry;  Laterality: Right;  . LIVER BIOPSY  2005   Done in [place], [place]. Chronic hepatitis with mild periportal inflammation, lobular unicellular necrosis and portal fibrosis. Grade 2, stage 1-2.  . LOWER EXTREMITY ANGIOGRAPHY Right 06/03/2016   Procedure: Lower Extremity Angiography;  Surgeon: Waynetta Sandy, MD;  Location: [facility];  Service: Cardiovascular;  Laterality: Right;  . MAXIMUM ACCESS (MAS)POSTERIOR LUMBAR INTERBODY FUSION (PLIF) 1 LEVEL N/A 11/25/2013   Procedure: FOR MAXIMUM ACCESS (MAS) POSTERIOR LUMBAR INTERBODY FUSION (PLIF) 1 LEVEL;  Surgeon: Eustace Moore, MD;  Location: [facility];  Service: Neurosurgery;  Laterality: N/A;  FOR MAXIMUM ACCESS (MAS) POSTERIOR LUMBAR INTERBODY FUSION (PLIF) 1 LEVEL LUMBAR 3-4  . PERIPHERAL  VASCULAR ATHERECTOMY Right 06/03/2016   Procedure: Peripheral Vascular Atherectomy;  Surgeon: Waynetta Sandy, MD;  Location: [facility];  Service: Cardiovascular;  Laterality: Right;  SFA WITH STENT  . PERIPHERAL VASCULAR CATHETERIZATION Left 08/01/2015   Procedure: Lower Extremity Angiography;  Surgeon: Serafina Mitchell, MD;  Location: [facility];  Service: Cardiovascular;  Laterality: Left;  . PERIPHERAL VASCULAR CATHETERIZATION N/A 08/01/2015   Procedure: Abdominal Aortogram;  Surgeon: Serafina Mitchell, MD;  Location: [facility];  Service: Cardiovascular;  Laterality: N/A;  . PERIPHERAL VASCULAR CATHETERIZATION N/A 08/08/2015   Procedure: Abdominal Aortogram w/Lower Extremity;  Surgeon: Serafina Mitchell, MD;  Location: [facility];  Service: Cardiovascular;  Laterality: N/A;  . PERIPHERAL VASCULAR CATHETERIZATION Left 08/08/2015   Procedure: Peripheral Vascular Balloon Angioplasty;  Surgeon: Serafina Mitchell, MD;  Location: [facility];  Service: Cardiovascular;  Laterality: Left;  left popiteal artery, left peronealtrunk, left post tibial  . POLYPECTOMY N/A 09/03/2012   Procedure: POLYPECTOMY;  Surgeon: Daneil Dolin, MD;  [facility];  Service: Endoscopy;  Laterality: N/A;  cecal polyp  . TONSILLECTOMY    . TRANSMETATARSAL AMPUTATION Right 06/13/2016   Procedure: RIGHT TRANSMETATARSAL AMPUTATION;  Surgeon: Waynetta Sandy, MD;  Location: [facility];  Service: Vascular;  Laterality: Right;  . TRANSMETATARSAL AMPUTATION Right 07/10/2016   Procedure: REVISION RIGHT TRANSMETATARSAL AMPUTATION;  Surgeon: Waynetta Sandy, MD;  Location: [facility];  Service: Vascular;  Laterality: Right;    Short Social History:  Social History  Substance Use Topics  . Smoking status: Former Smoker    Packs/day: 1.00    Years: 47.00    Types: Cigarettes    Quit date: 07/13/2015  . Smokeless tobacco: Never Used  . Alcohol use No     Comment:  none 08/15/2015  "stopped drinking 3-4 years ago"    Allergies  Allergen Reactions  . No Known Allergies     Current Outpatient Prescriptions  Medication Sig Dispense Refill  . allopurinol (ZYLOPRIM) 100 MG tablet Take 200 mg by mouth every evening.    . Amino Acids-Protein Hydrolys (FEEDING SUPPLEMENT, PRO-STAT SUGAR FREE 64,) LIQD Take 30 mLs by mouth 2 (two) times daily. 900 mL 0  . aspirin EC 81 MG tablet Take 81 mg by mouth daily.    . carvedilol (COREG) 3.125 MG tablet Take 1 tablet (3.125 mg total) by mouth 2 (two) times daily with a meal. 60 tablet 0  . ferrous sulfate 325 (65 FE) MG tablet Take 325 mg by mouth 3 (three) times daily with meals.    . gabapentin (NEURONTIN) 600 MG tablet Take 1 tablet (600 mg total) by mouth 3 (three) times daily.    . insulin aspart (NOVOLOG) 100 UNIT/ML injection Inject 5 Units into the skin 3 (three) times daily after meals.    . insulin glargine (LANTUS) 100 UNIT/ML injection Inject 0.25 mLs (25 Units total) into the skin at bedtime.    Marland Kitchen lisinopril (PRINIVIL,ZESTRIL) 5 MG tablet Take 5 mg  by mouth daily.     . metFORMIN (GLUCOPHAGE) 500 MG tablet Take 500 mg by mouth daily with breakfast.    . morphine (MS CONTIN) 15 MG 12 hr tablet Take 1 tablet (15 mg total) by mouth every 12 (twelve) hours. 20 tablet 0  . Oxycodone HCl 10 MG TABS Take 1-1.5 tablets (10-15 mg total) by mouth every 4 (four) hours as needed. 20 tablet 0  . polyethylene glycol (MIRALAX / GLYCOLAX) packet Take 17 g by mouth 2 (two) times daily. 14 each 0  . simvastatin (ZOCOR) 20 MG tablet Take 20 mg by mouth daily.     . tamsulosin (FLOMAX) 0.4 MG CAPS capsule Take 1 capsule (0.4 mg total) by mouth daily. 30 capsule 0  . torsemide (DEMADEX) 20 MG tablet Take 20 mg by mouth daily.     . clopidogrel (PLAVIX) 75 MG tablet Take 1 tablet (75 mg total) by mouth daily with breakfast. (Patient not taking: Reported on 08/09/2016) 30 tablet 0  . gabapentin (NEURONTIN) 300 MG capsule Take 1 capsule (300 mg  total) by mouth 3 (three) times daily. (Patient not taking: Reported on 08/09/2016) 90 capsule 0  . magnesium hydroxide (MILK OF MAGNESIA) 400 MG/5ML suspension Take 30 mLs by mouth daily as needed for mild constipation.    . saccharomyces boulardii (FLORASTOR) 250 MG capsule Take 1 capsule (250 mg total) by mouth 2 (two) times daily. 30 capsule 0  . senna-docusate (SENOKOT-S) 8.6-50 MG tablet Take 1 tablet by mouth 2 (two) times daily.     No current facility-administered medications for this visit.     Review of Systems  Constitutional:  Constitutional negative. Musculoskeletal: Positive for leg pain.        Objective:  Objective   Vitals:   08/09/16 1359  BP: 108/65  Pulse: 82  Resp: 18  Temp: 98.4 F (36.9 C)  SpO2: 97%  Weight: 217 lb (98.4 kg)  Height: 6\' 1"  (1.854 m)   Body mass index is 28.63 kg/m.  Physical Exam  Constitutional: He is oriented to person, place, and time. He appears well-developed.  Cardiovascular:  Strong right pt signal  Musculoskeletal: He exhibits edema.  Neurological: He is alert and oriented to person, place, and time.  Skin:  Right foot wound is dehisced. Minimal drainage, bone exposed laterally        Assessment/Plan:     66 yo male status post right Lisfranc amputation now dehisced and bone is exposed laterally and there is drainage. I recommended to him to undergo proximal limitation and he now agrees. We will get this scheduled in the near future.     Waynetta Sandy MD [facility]

## 2016-08-19 ENCOUNTER — Other Ambulatory Visit: Payer: Self-pay

## 2016-08-20 ENCOUNTER — Encounter (HOSPITAL_COMMUNITY): Payer: Self-pay | Admitting: *Deleted

## 2016-08-20 NOTE — Progress Notes (Signed)
Spoke with Shanon Brow, LPN at [facility] in [place] where pt is a resident. Shanon Brow verified allergies, medications and medical history of pt. Pt is diabetic, states his fasting blood sugar runs between 110-130. Pt's last A1C was 9.0 on 06/01/16. Shanon Brow states pt is alert and oriented and able to speak for himself. Faxed pre-op instructions to Shanon Brow at [facility].

## 2016-08-20 NOTE — Pre-Procedure Instructions (Signed)
    BERTRAND VOWELS  08/20/2016    Mr. Binstock's procedure is scheduled on Thursday, August 22, 2016 at 12:20 PM.   Report to [facility] Entrance "A" Admitting Office at 9:50 AM.   Call this number if you have problems the morning of surgery: [phone]     Remember:  Mr. Demaria is not to eat food or drink liquids after midnight Wednesday, 08/21/16.  Give patient these medicines the morning of surgery with A SIP OF WATER: Aspirin, Carvedilol (Coreg), Gabapentin (Neurontin), MS Contin, Flomax  Wednesday PM, give patient 1/2 of his regular dose (give 12 units) of Lantus Insulin. Do not give him his Novolog insulin the morning of surgery. Do not give patient Metformin the morning of surgery.   Please check patient's blood sugar when he wakes up Thursday AM and every 2 hours until he leaves for the hospital. If blood sugar is 70 or below, treat with 1/2 cup of clear juice (apple or cranberry) and recheck blood sugar 15 minutes after drinking juice. If blood sugar continues to be 70 or below, call the Short Stay department and ask to speak to a nurse.   Do not wear jewelry.  Do not wear lotions, powders, cologne or deodorant.  Men may shave face and neck.  Do not bring valuables to the hospital.  [facility] is not responsible for any belongings or valuables.  Contacts, dentures or bridgework may not be worn into surgery.    Any questions please call me, Lilia Pro, RN at [phone]

## 2016-08-22 ENCOUNTER — Inpatient Hospital Stay (HOSPITAL_COMMUNITY): Payer: Medicaid Other | Admitting: Certified Registered"

## 2016-08-22 ENCOUNTER — Encounter (HOSPITAL_COMMUNITY): Payer: Self-pay | Admitting: *Deleted

## 2016-08-22 ENCOUNTER — Inpatient Hospital Stay (HOSPITAL_COMMUNITY)
Admission: RE | Admit: 2016-08-22 | Discharge: 2016-08-27 | DRG: 475 | Disposition: A | Discharge status: Skilled Nursing Facility | Payer: Medicaid Other | Source: Ambulatory Visit | Attending: Vascular Surgery | Admitting: Vascular Surgery

## 2016-08-22 ENCOUNTER — Encounter (HOSPITAL_COMMUNITY)
Admission: RE | Disposition: A | Discharge status: Skilled Nursing Facility | Payer: Self-pay | Source: Ambulatory Visit | Attending: Vascular Surgery

## 2016-08-22 DIAGNOSIS — M79671 Pain in right foot: Secondary | ICD-10-CM | POA: Diagnosis present

## 2016-08-22 DIAGNOSIS — I739 Peripheral vascular disease, unspecified: Secondary | ICD-10-CM | POA: Diagnosis present

## 2016-08-22 DIAGNOSIS — Z7982 Long term (current) use of aspirin: Secondary | ICD-10-CM | POA: Diagnosis not present

## 2016-08-22 DIAGNOSIS — E1122 Type 2 diabetes mellitus with diabetic chronic kidney disease: Secondary | ICD-10-CM | POA: Diagnosis present

## 2016-08-22 DIAGNOSIS — G8929 Other chronic pain: Secondary | ICD-10-CM | POA: Diagnosis present

## 2016-08-22 DIAGNOSIS — Z87891 Personal history of nicotine dependence: Secondary | ICD-10-CM | POA: Diagnosis not present

## 2016-08-22 DIAGNOSIS — T8754 Necrosis of amputation stump, left lower extremity: Secondary | ICD-10-CM | POA: Diagnosis present

## 2016-08-22 DIAGNOSIS — R509 Fever, unspecified: Secondary | ICD-10-CM

## 2016-08-22 DIAGNOSIS — J9811 Atelectasis: Secondary | ICD-10-CM | POA: Diagnosis not present

## 2016-08-22 DIAGNOSIS — M109 Gout, unspecified: Secondary | ICD-10-CM | POA: Diagnosis present

## 2016-08-22 DIAGNOSIS — K219 Gastro-esophageal reflux disease without esophagitis: Secondary | ICD-10-CM | POA: Diagnosis present

## 2016-08-22 DIAGNOSIS — D62 Acute posthemorrhagic anemia: Secondary | ICD-10-CM | POA: Diagnosis not present

## 2016-08-22 DIAGNOSIS — E1152 Type 2 diabetes mellitus with diabetic peripheral angiopathy with gangrene: Secondary | ICD-10-CM | POA: Diagnosis present

## 2016-08-22 DIAGNOSIS — Z794 Long term (current) use of insulin: Secondary | ICD-10-CM | POA: Diagnosis not present

## 2016-08-22 DIAGNOSIS — E1142 Type 2 diabetes mellitus with diabetic polyneuropathy: Secondary | ICD-10-CM | POA: Diagnosis present

## 2016-08-22 DIAGNOSIS — N183 Chronic kidney disease, stage 3 (moderate): Secondary | ICD-10-CM | POA: Diagnosis present

## 2016-08-22 DIAGNOSIS — J45909 Unspecified asthma, uncomplicated: Secondary | ICD-10-CM | POA: Diagnosis present

## 2016-08-22 DIAGNOSIS — I130 Hypertensive heart and chronic kidney disease with heart failure and stage 1 through stage 4 chronic kidney disease, or unspecified chronic kidney disease: Secondary | ICD-10-CM | POA: Diagnosis present

## 2016-08-22 DIAGNOSIS — N400 Enlarged prostate without lower urinary tract symptoms: Secondary | ICD-10-CM | POA: Diagnosis present

## 2016-08-22 DIAGNOSIS — Z79899 Other long term (current) drug therapy: Secondary | ICD-10-CM | POA: Diagnosis not present

## 2016-08-22 DIAGNOSIS — Z89512 Acquired absence of left leg below knee: Secondary | ICD-10-CM | POA: Diagnosis not present

## 2016-08-22 DIAGNOSIS — B182 Chronic viral hepatitis C: Secondary | ICD-10-CM | POA: Diagnosis present

## 2016-08-22 DIAGNOSIS — Z981 Arthrodesis status: Secondary | ICD-10-CM | POA: Diagnosis not present

## 2016-08-22 DIAGNOSIS — I5032 Chronic diastolic (congestive) heart failure: Secondary | ICD-10-CM | POA: Diagnosis present

## 2016-08-22 DIAGNOSIS — T8781 Dehiscence of amputation stump: Secondary | ICD-10-CM

## 2016-08-22 DIAGNOSIS — E785 Hyperlipidemia, unspecified: Secondary | ICD-10-CM | POA: Diagnosis present

## 2016-08-22 DIAGNOSIS — Y835 Amputation of limb(s) as the cause of abnormal reaction of the patient, or of later complication, without mention of misadventure at the time of the procedure: Secondary | ICD-10-CM | POA: Diagnosis present

## 2016-08-22 DIAGNOSIS — Z8673 Personal history of transient ischemic attack (TIA), and cerebral infarction without residual deficits: Secondary | ICD-10-CM | POA: Diagnosis not present

## 2016-08-22 HISTORY — DX: Chronic diastolic (congestive) heart failure: I50.32

## 2016-08-22 HISTORY — PX: AMPUTATION: SHX166

## 2016-08-22 HISTORY — DX: Osteomyelitis, unspecified: M86.9

## 2016-08-22 LAB — CBC
HCT: 28.5 % — ABNORMAL LOW (ref 39.0–52.0)
HCT: 26.1 % — ABNORMAL LOW (ref 39.0–52.0)
HEMOGLOBIN: 9 g/dL — AB (ref 13.0–17.0)
Hemoglobin: 8.1 g/dL — ABNORMAL LOW (ref 13.0–17.0)
MCH: 26.9 pg (ref 26.0–34.0)
MCH: 26.6 pg (ref 26.0–34.0)
MCHC: 31.6 g/dL (ref 30.0–36.0)
MCHC: 31 g/dL (ref 30.0–36.0)
MCV: 85.1 fL (ref 78.0–100.0)
MCV: 85.9 fL (ref 78.0–100.0)
PLATELETS: 502 10*3/uL — AB (ref 150–400)
PLATELETS: 453 10*3/uL — AB (ref 150–400)
RBC: 3.35 MIL/uL — AB (ref 4.22–5.81)
RBC: 3.04 MIL/uL — AB (ref 4.22–5.81)
RDW: 14.3 % (ref 11.5–15.5)
RDW: 14.4 % (ref 11.5–15.5)
WBC: 11.5 10*3/uL — AB (ref 4.0–10.5)
WBC: 9.9 10*3/uL (ref 4.0–10.5)

## 2016-08-22 LAB — BASIC METABOLIC PANEL
ANION GAP: 9 (ref 5–15)
BUN: 19 mg/dL (ref 6–20)
CO2: 28 mmol/L (ref 22–32)
Calcium: 9.4 mg/dL (ref 8.9–10.3)
Chloride: 94 mmol/L — ABNORMAL LOW (ref 101–111)
Creatinine, Ser: 1.19 mg/dL (ref 0.61–1.24)
Glucose, Bld: 102 mg/dL — ABNORMAL HIGH (ref 65–99)
Potassium: 3.7 mmol/L (ref 3.5–5.1)
SODIUM: 131 mmol/L — AB (ref 135–145)

## 2016-08-22 LAB — GLUCOSE, CAPILLARY
GLUCOSE-CAPILLARY: 99 mg/dL (ref 65–99)
GLUCOSE-CAPILLARY: 341 mg/dL — AB (ref 65–99)
Glucose-Capillary: 128 mg/dL — ABNORMAL HIGH (ref 65–99)

## 2016-08-22 SURGERY — AMPUTATION BELOW KNEE
Anesthesia: Regional | Site: Leg Lower | Laterality: Right

## 2016-08-22 MED ORDER — INSULIN ASPART 100 UNIT/ML ~~LOC~~ SOLN
5.0000 [IU] | Freq: Three times a day (TID) | SUBCUTANEOUS | Status: DC
  Administered 2016-08-22 – 2016-08-27 (×12): 5 [IU] via SUBCUTANEOUS

## 2016-08-22 MED ORDER — SODIUM CHLORIDE 0.9 % IV SOLN
INTRAVENOUS | Status: DC
  Administered 2016-08-22 (×2): via INTRAVENOUS

## 2016-08-22 MED ORDER — SENNOSIDES-DOCUSATE SODIUM 8.6-50 MG PO TABS
1.0000 | ORAL_TABLET | Freq: Two times a day (BID) | ORAL | Status: DC
  Administered 2016-08-22 – 2016-08-27 (×10): 1 via ORAL
  Filled 2016-08-22 (×10): qty 1

## 2016-08-22 MED ORDER — MAGNESIUM SULFATE 2 GM/50ML IV SOLN: 2.0000 g | Freq: Every day | INTRAVENOUS | Status: DC | PRN

## 2016-08-22 MED ORDER — CLOPIDOGREL BISULFATE 75 MG PO TABS
75.0000 mg | ORAL_TABLET | Freq: Every day | ORAL | Status: DC
  Administered 2016-08-23 – 2016-08-27 (×5): 75 mg via ORAL
  Filled 2016-08-22 (×5): qty 1

## 2016-08-22 MED ORDER — MIDAZOLAM HCL 2 MG/2ML IJ SOLN
INTRAMUSCULAR | Status: AC
  Administered 2016-08-22: 2 mg via INTRAVENOUS
  Filled 2016-08-22: qty 2

## 2016-08-22 MED ORDER — DEXTROSE 5 % IV SOLN
1.5000 g | Freq: Two times a day (BID) | INTRAVENOUS | Status: AC
  Administered 2016-08-23 (×2): 1.5 g via INTRAVENOUS
  Filled 2016-08-22 (×2): qty 1.5

## 2016-08-22 MED ORDER — ROPIVACAINE HCL 5 MG/ML IJ SOLN
INTRAMUSCULAR | Status: DC | PRN
  Administered 2016-08-22: 5 mL via PERINEURAL
  Administered 2016-08-22: 10 mL via PERINEURAL

## 2016-08-22 MED ORDER — TORSEMIDE 20 MG PO TABS
20.0000 mg | ORAL_TABLET | Freq: Every day | ORAL | Status: DC
  Administered 2016-08-23 – 2016-08-27 (×5): 20 mg via ORAL
  Filled 2016-08-22 (×5): qty 1

## 2016-08-22 MED ORDER — TAMSULOSIN HCL 0.4 MG PO CAPS
0.4000 mg | ORAL_CAPSULE | Freq: Every day | ORAL | Status: DC
  Administered 2016-08-23 – 2016-08-27 (×5): 0.4 mg via ORAL
  Filled 2016-08-22 (×5): qty 1

## 2016-08-22 MED ORDER — OXYCODONE HCL 10 MG PO TABS: 10.0000 mg | ORAL_TABLET | ORAL | Status: DC | PRN

## 2016-08-22 MED ORDER — LABETALOL HCL 5 MG/ML IV SOLN: 10.0000 mg | INTRAVENOUS | Status: DC | PRN

## 2016-08-22 MED ORDER — ACETAMINOPHEN 325 MG PO TABS
325.0000 mg | ORAL_TABLET | ORAL | Status: DC | PRN
  Filled 2016-08-22: qty 2

## 2016-08-22 MED ORDER — MAGNESIUM HYDROXIDE 400 MG/5ML PO SUSP
30.0000 mL | Freq: Every day | ORAL | Status: DC | PRN
Start: 2016-08-22 — End: 2016-08-27
  Administered 2016-08-25: 30 mL via ORAL
  Filled 2016-08-22: qty 30

## 2016-08-22 MED ORDER — INSULIN ASPART 100 UNIT/ML ~~LOC~~ SOLN
0.0000 [IU] | Freq: Three times a day (TID) | SUBCUTANEOUS | Status: DC
  Administered 2016-08-22 – 2016-08-23 (×2): 1 [IU] via SUBCUTANEOUS
  Administered 2016-08-23: 3 [IU] via SUBCUTANEOUS
  Administered 2016-08-23: 1 [IU] via SUBCUTANEOUS
  Administered 2016-08-24: 2 [IU] via SUBCUTANEOUS
  Administered 2016-08-24: 1 [IU] via SUBCUTANEOUS
  Administered 2016-08-24: 3 [IU] via SUBCUTANEOUS
  Administered 2016-08-25 (×2): 1 [IU] via SUBCUTANEOUS
  Administered 2016-08-26 (×2): 2 [IU] via SUBCUTANEOUS
  Administered 2016-08-27: 3 [IU] via SUBCUTANEOUS
  Administered 2016-08-27: 2 [IU] via SUBCUTANEOUS

## 2016-08-22 MED ORDER — PANTOPRAZOLE SODIUM 40 MG PO TBEC
40.0000 mg | DELAYED_RELEASE_TABLET | Freq: Every day | ORAL | Status: DC
  Administered 2016-08-22 – 2016-08-27 (×6): 40 mg via ORAL
  Filled 2016-08-22 (×6): qty 1

## 2016-08-22 MED ORDER — PHENOL 1.4 % MT LIQD: 1.0000 | OROMUCOSAL | Status: DC | PRN

## 2016-08-22 MED ORDER — DEXTROSE 5 % IV SOLN
INTRAVENOUS | Status: AC
  Filled 2016-08-22: qty 1.5

## 2016-08-22 MED ORDER — PHENYLEPHRINE 40 MCG/ML (10ML) SYRINGE FOR IV PUSH (FOR BLOOD PRESSURE SUPPORT)
PREFILLED_SYRINGE | INTRAVENOUS | Status: AC
  Filled 2016-08-22: qty 10

## 2016-08-22 MED ORDER — POLYETHYLENE GLYCOL 3350 17 G PO PACK
17.0000 g | PACK | Freq: Two times a day (BID) | ORAL | Status: DC
Start: 2016-08-22 — End: 2016-08-27
  Administered 2016-08-22 – 2016-08-26 (×8): 17 g via ORAL
  Filled 2016-08-22 (×10): qty 1

## 2016-08-22 MED ORDER — HYDRALAZINE HCL 20 MG/ML IJ SOLN: 5.0000 mg | INTRAMUSCULAR | Status: DC | PRN

## 2016-08-22 MED ORDER — BISACODYL 5 MG PO TBEC
5.0000 mg | DELAYED_RELEASE_TABLET | Freq: Every day | ORAL | Status: DC | PRN
  Administered 2016-08-25: 5 mg via ORAL
  Filled 2016-08-22: qty 1

## 2016-08-22 MED ORDER — PRO-STAT SUGAR FREE PO LIQD
30.0000 mL | Freq: Two times a day (BID) | ORAL | Status: DC
  Administered 2016-08-23 – 2016-08-27 (×9): 30 mL via ORAL
  Filled 2016-08-22 (×9): qty 30

## 2016-08-22 MED ORDER — HEPARIN SODIUM (PORCINE) 5000 UNIT/ML IJ SOLN
5000.0000 [IU] | Freq: Three times a day (TID) | INTRAMUSCULAR | Status: DC
  Administered 2016-08-23 – 2016-08-27 (×13): 5000 [IU] via SUBCUTANEOUS
  Filled 2016-08-22 (×11): qty 1

## 2016-08-22 MED ORDER — HYDROMORPHONE HCL 1 MG/ML IJ SOLN
0.2500 mg | INTRAMUSCULAR | Status: DC | PRN
Start: 2016-08-22 — End: 2016-08-22

## 2016-08-22 MED ORDER — CARVEDILOL 3.125 MG PO TABS
3.1250 mg | ORAL_TABLET | Freq: Two times a day (BID) | ORAL | Status: DC
  Administered 2016-08-22 – 2016-08-27 (×10): 3.125 mg via ORAL
  Filled 2016-08-22 (×10): qty 1

## 2016-08-22 MED ORDER — PHENYLEPHRINE HCL 10 MG/ML IJ SOLN
INTRAMUSCULAR | Status: DC | PRN
  Administered 2016-08-22: 20 ug/min via INTRAVENOUS

## 2016-08-22 MED ORDER — ONDANSETRON HCL 4 MG/2ML IJ SOLN
INTRAMUSCULAR | Status: DC | PRN
  Administered 2016-08-22: 4 mg via INTRAVENOUS

## 2016-08-22 MED ORDER — 0.9 % SODIUM CHLORIDE (POUR BTL) OPTIME
TOPICAL | Status: DC | PRN
  Administered 2016-08-22: 1000 mL

## 2016-08-22 MED ORDER — FENTANYL CITRATE (PF) 250 MCG/5ML IJ SOLN
INTRAMUSCULAR | Status: AC
  Filled 2016-08-22: qty 5

## 2016-08-22 MED ORDER — DEXTROSE 5 % IV SOLN
1.5000 g | INTRAVENOUS | Status: AC
  Administered 2016-08-22: 1.5 g via INTRAVENOUS

## 2016-08-22 MED ORDER — DOCUSATE SODIUM 100 MG PO CAPS
100.0000 mg | ORAL_CAPSULE | Freq: Every day | ORAL | Status: DC
  Administered 2016-08-23 – 2016-08-27 (×5): 100 mg via ORAL
  Filled 2016-08-22 (×5): qty 1

## 2016-08-22 MED ORDER — GUAIFENESIN-DM 100-10 MG/5ML PO SYRP: 15.0000 mL | ORAL_SOLUTION | ORAL | Status: DC | PRN

## 2016-08-22 MED ORDER — LISINOPRIL 5 MG PO TABS
5.0000 mg | ORAL_TABLET | Freq: Every day | ORAL | Status: DC
  Administered 2016-08-23 – 2016-08-27 (×5): 5 mg via ORAL
  Filled 2016-08-22 (×5): qty 1

## 2016-08-22 MED ORDER — ACETAMINOPHEN 650 MG RE SUPP: 325.0000 mg | RECTAL | Status: DC | PRN

## 2016-08-22 MED ORDER — PROPOFOL 10 MG/ML IV BOLUS
INTRAVENOUS | Status: AC
  Filled 2016-08-22: qty 20

## 2016-08-22 MED ORDER — MIDAZOLAM HCL 2 MG/2ML IJ SOLN
1.0000 mg | INTRAMUSCULAR | Status: DC | PRN
  Administered 2016-08-22: 2 mg via INTRAVENOUS

## 2016-08-22 MED ORDER — ONDANSETRON HCL 4 MG/2ML IJ SOLN
4.0000 mg | Freq: Four times a day (QID) | INTRAMUSCULAR | Status: DC | PRN
Start: 2016-08-22 — End: 2016-08-27

## 2016-08-22 MED ORDER — MORPHINE SULFATE (PF) 2 MG/ML IV SOLN
1.0000 mg | INTRAVENOUS | Status: DC | PRN
  Administered 2016-08-23 (×2): 2 mg via INTRAVENOUS
  Filled 2016-08-22 (×2): qty 1

## 2016-08-22 MED ORDER — SODIUM CHLORIDE 0.9 % IV SOLN
INTRAVENOUS | Status: DC
  Administered 2016-08-22 – 2016-08-25 (×7): via INTRAVENOUS

## 2016-08-22 MED ORDER — POTASSIUM CHLORIDE CRYS ER 20 MEQ PO TBCR: 20.0000 meq | EXTENDED_RELEASE_TABLET | Freq: Every day | ORAL | Status: DC | PRN

## 2016-08-22 MED ORDER — FENTANYL CITRATE (PF) 100 MCG/2ML IJ SOLN
50.0000 ug | INTRAMUSCULAR | Status: DC | PRN
  Administered 2016-08-22: 50 ug via INTRAVENOUS

## 2016-08-22 MED ORDER — METOPROLOL TARTRATE 5 MG/5ML IV SOLN: 2.0000 mg | INTRAVENOUS | Status: DC | PRN

## 2016-08-22 MED ORDER — SIMVASTATIN 20 MG PO TABS
20.0000 mg | ORAL_TABLET | Freq: Every day | ORAL | Status: DC
  Administered 2016-08-22 – 2016-08-26 (×5): 20 mg via ORAL
  Filled 2016-08-22 (×5): qty 1

## 2016-08-22 MED ORDER — BUPIVACAINE-EPINEPHRINE (PF) 0.5% -1:200000 IJ SOLN
INTRAMUSCULAR | Status: DC | PRN
  Administered 2016-08-22: 20 mL via PERINEURAL
  Administered 2016-08-22: 15 mL via PERINEURAL

## 2016-08-22 MED ORDER — MORPHINE SULFATE ER 15 MG PO TBCR
15.0000 mg | EXTENDED_RELEASE_TABLET | Freq: Two times a day (BID) | ORAL | Status: DC
  Administered 2016-08-22 – 2016-08-27 (×10): 15 mg via ORAL
  Filled 2016-08-22 (×10): qty 1

## 2016-08-22 MED ORDER — CHLORHEXIDINE GLUCONATE CLOTH 2 % EX PADS: 6.0000 | MEDICATED_PAD | Freq: Once | CUTANEOUS | Status: DC

## 2016-08-22 MED ORDER — ALLOPURINOL 100 MG PO TABS
200.0000 mg | ORAL_TABLET | Freq: Every evening | ORAL | Status: DC
  Administered 2016-08-22 – 2016-08-26 (×5): 200 mg via ORAL
  Filled 2016-08-22 (×5): qty 2

## 2016-08-22 MED ORDER — DEXAMETHASONE SODIUM PHOSPHATE 10 MG/ML IJ SOLN
INTRAMUSCULAR | Status: DC | PRN
  Administered 2016-08-22: 5 mg via INTRAVENOUS

## 2016-08-22 MED ORDER — PROPOFOL 10 MG/ML IV BOLUS
INTRAVENOUS | Status: DC | PRN
Start: 2016-08-22 — End: 2016-08-22
  Administered 2016-08-22: 200 mg via INTRAVENOUS

## 2016-08-22 MED ORDER — OXYCODONE HCL 5 MG PO TABS
10.0000 mg | ORAL_TABLET | ORAL | Status: DC | PRN
  Administered 2016-08-22 – 2016-08-23 (×5): 10 mg via ORAL
  Administered 2016-08-24: 15 mg via ORAL
  Administered 2016-08-24: 10 mg via ORAL
  Administered 2016-08-24 – 2016-08-27 (×11): 15 mg via ORAL
  Filled 2016-08-22: qty 2
  Filled 2016-08-22 (×6): qty 3
  Filled 2016-08-22: qty 2
  Filled 2016-08-22: qty 3
  Filled 2016-08-22: qty 2
  Filled 2016-08-22 (×6): qty 3
  Filled 2016-08-22: qty 2
  Filled 2016-08-22 (×2): qty 3
  Filled 2016-08-22: qty 2

## 2016-08-22 MED ORDER — PHENYLEPHRINE 40 MCG/ML (10ML) SYRINGE FOR IV PUSH (FOR BLOOD PRESSURE SUPPORT)
PREFILLED_SYRINGE | INTRAVENOUS | Status: DC | PRN
  Administered 2016-08-22: 80 ug via INTRAVENOUS
  Administered 2016-08-22: 160 ug via INTRAVENOUS
  Administered 2016-08-22 (×2): 80 ug via INTRAVENOUS

## 2016-08-22 MED ORDER — ONDANSETRON HCL 4 MG/2ML IJ SOLN
INTRAMUSCULAR | Status: AC
  Filled 2016-08-22: qty 2

## 2016-08-22 MED ORDER — FERROUS SULFATE 325 (65 FE) MG PO TABS
325.0000 mg | ORAL_TABLET | Freq: Two times a day (BID) | ORAL | Status: DC
  Administered 2016-08-22 – 2016-08-27 (×10): 325 mg via ORAL
  Filled 2016-08-22 (×10): qty 1

## 2016-08-22 MED ORDER — METFORMIN HCL 500 MG PO TABS
500.0000 mg | ORAL_TABLET | Freq: Every day | ORAL | Status: DC
  Administered 2016-08-23 – 2016-08-27 (×5): 500 mg via ORAL
  Filled 2016-08-22 (×5): qty 1

## 2016-08-22 MED ORDER — DEXAMETHASONE SODIUM PHOSPHATE 10 MG/ML IJ SOLN
INTRAMUSCULAR | Status: AC
  Filled 2016-08-22: qty 1

## 2016-08-22 MED ORDER — FENTANYL CITRATE (PF) 100 MCG/2ML IJ SOLN
INTRAMUSCULAR | Status: AC
Start: 2016-08-22 — End: 2016-08-22
  Administered 2016-08-22: 50 ug via INTRAVENOUS
  Filled 2016-08-22: qty 2

## 2016-08-22 MED ORDER — ALUM & MAG HYDROXIDE-SIMETH 200-200-20 MG/5ML PO SUSP: 15.0000 mL | ORAL | Status: DC | PRN

## 2016-08-22 MED ORDER — ASPIRIN EC 81 MG PO TBEC
81.0000 mg | DELAYED_RELEASE_TABLET | Freq: Every day | ORAL | Status: DC
  Administered 2016-08-23 – 2016-08-27 (×5): 81 mg via ORAL
  Filled 2016-08-22 (×5): qty 1

## 2016-08-22 MED ORDER — GABAPENTIN 600 MG PO TABS
600.0000 mg | ORAL_TABLET | Freq: Three times a day (TID) | ORAL | Status: DC
  Administered 2016-08-22 – 2016-08-27 (×13): 600 mg via ORAL
  Filled 2016-08-22 (×14): qty 1

## 2016-08-22 MED ORDER — INSULIN GLARGINE 100 UNIT/ML ~~LOC~~ SOLN
25.0000 [IU] | Freq: Every day | SUBCUTANEOUS | Status: DC
  Administered 2016-08-22 – 2016-08-25 (×2): 25 [IU] via SUBCUTANEOUS
  Filled 2016-08-22 (×5): qty 0.25

## 2016-08-22 SURGICAL SUPPLY — 45 items
BANDAGE ACE 4X5 VEL STRL LF (GAUZE/BANDAGES/DRESSINGS) ×3 IMPLANT
BANDAGE ESMARK 6X9 LF (GAUZE/BANDAGES/DRESSINGS) ×1 IMPLANT
BLADE SAW GIGLI 510 (BLADE) ×2 IMPLANT
BLADE SAW GIGLI 510MM (BLADE) ×1
BNDG CMPR 9X6 STRL LF SNTH (GAUZE/BANDAGES/DRESSINGS) ×1
BNDG COHESIVE 6X5 TAN STRL LF (GAUZE/BANDAGES/DRESSINGS) ×3 IMPLANT
BNDG ESMARK 6X9 LF (GAUZE/BANDAGES/DRESSINGS) ×3
BNDG GAUZE ELAST 4 BULKY (GAUZE/BANDAGES/DRESSINGS) ×3 IMPLANT
CANISTER SUCT 3000ML PPV (MISCELLANEOUS) ×3 IMPLANT
COVER SURGICAL LIGHT HANDLE (MISCELLANEOUS) ×3 IMPLANT
CUFF TOURNIQUET SINGLE 24IN (TOURNIQUET CUFF) ×3 IMPLANT
DRAPE HALF SHEET 40X57 (DRAPES) ×3 IMPLANT
DRAPE ORTHO SPLIT 77X108 STRL (DRAPES) ×4
DRAPE SURG ORHT 6 SPLT 77X108 (DRAPES) ×2 IMPLANT
DRSG ADAPTIC 3X8 NADH LF (GAUZE/BANDAGES/DRESSINGS) ×3 IMPLANT
ELECT REM PT RETURN 9FT ADLT (ELECTROSURGICAL) ×3
ELECTRODE REM PT RTRN 9FT ADLT (ELECTROSURGICAL) ×1 IMPLANT
GAUZE SPONGE 4X4 12PLY STRL (GAUZE/BANDAGES/DRESSINGS) ×3 IMPLANT
GAUZE SPONGE 4X4 12PLY STRL LF (GAUZE/BANDAGES/DRESSINGS) ×3 IMPLANT
GLOVE BIO SURGEON STRL SZ7.5 (GLOVE) ×3 IMPLANT
GLOVE BIOGEL PI IND STRL 7.0 (GLOVE) ×1 IMPLANT
GLOVE BIOGEL PI IND STRL 7.5 (GLOVE) ×1 IMPLANT
GLOVE BIOGEL PI INDICATOR 7.0 (GLOVE) ×2
GLOVE BIOGEL PI INDICATOR 7.5 (GLOVE) ×2
GLOVE ECLIPSE 7.0 STRL STRAW (GLOVE) ×3 IMPLANT
GLOVE ECLIPSE 7.5 STRL STRAW (GLOVE) ×3 IMPLANT
GOWN STRL REUS W/ TWL LRG LVL3 (GOWN DISPOSABLE) ×2 IMPLANT
GOWN STRL REUS W/ TWL XL LVL3 (GOWN DISPOSABLE) ×1 IMPLANT
GOWN STRL REUS W/TWL LRG LVL3 (GOWN DISPOSABLE) ×4
GOWN STRL REUS W/TWL XL LVL3 (GOWN DISPOSABLE) ×3
KIT BASIN OR (CUSTOM PROCEDURE TRAY) ×3 IMPLANT
KIT ROOM TURNOVER OR (KITS) ×3 IMPLANT
NS IRRIG 1000ML POUR BTL (IV SOLUTION) ×3 IMPLANT
PACK GENERAL/GYN (CUSTOM PROCEDURE TRAY) ×3 IMPLANT
PAD ARMBOARD 7.5X6 YLW CONV (MISCELLANEOUS) ×6 IMPLANT
STAPLER VISISTAT 35W (STAPLE) ×3 IMPLANT
STOCKINETTE IMPERVIOUS LG (DRAPES) ×3 IMPLANT
SUT SILK 0 TIES 10X30 (SUTURE) ×3 IMPLANT
SUT SILK 2 0 (SUTURE) ×3
SUT SILK 2 0 SH CR/8 (SUTURE) ×3 IMPLANT
SUT SILK 2-0 18XBRD TIE 12 (SUTURE) ×1 IMPLANT
SUT VIC AB 2-0 CT1 18 (SUTURE) ×12 IMPLANT
TOWEL GREEN STERILE (TOWEL DISPOSABLE) ×3 IMPLANT
UNDERPAD 30X30 (UNDERPADS AND DIAPERS) ×3 IMPLANT
WATER STERILE IRR 1000ML POUR (IV SOLUTION) ×3 IMPLANT

## 2016-08-22 NOTE — Anesthesia Procedure Notes (Signed)
Anesthesia Regional Block: Femoral nerve block   Pre-Anesthetic Checklist: ,, timeout performed, Correct Patient, Correct Site, Correct Laterality, Correct Procedure, Correct Position, site marked, Risks and benefits discussed,  Surgical consent,  Pre-op evaluation,  At surgeon's request and post-op pain management  Laterality: Lower and Right  Prep: chloraprep       Needles:  Injection technique: Single-shot  Needle Type: Echogenic Stimulator Needle          Additional Needles:   Procedures: ultrasound guided, nerve stimulator,,,,,,   Nerve Stimulator or Paresthesia:  Response: quad, 0.5 mA,   Additional Responses:   Narrative:  Start time: 08/22/2016 12:42 PM End time: 08/22/2016 12:47 PM Injection made incrementally with aspirations every 5 mL.  Performed by: Personally  Anesthesiologist: Mycah Mcdougall  Additional Notes: H+P and labs reviewed, risks and benefits discussed with patient, procedure tolerated well without complications

## 2016-08-22 NOTE — Transfer of Care (Signed)
Immediate Anesthesia Transfer of Care Note  Patient: Cory Weber  Procedure(s) Performed: Procedure(s): AMPUTATION BELOW KNEE-RIGHT (Right)  Patient Location: PACU  Anesthesia Type:General  Level of Consciousness: drowsy and patient cooperative  Airway & Oxygen Therapy: Patient Spontanous Breathing and Patient connected to face mask oxygen  Post-op Assessment: Report given to RN and Post -op Vital signs reviewed and stable  Post vital signs: Reviewed and stable  Last Vitals:  Vitals:   08/22/16 1416 08/22/16 1420  BP: 121/76   Pulse: 84   Resp: 16   Temp: 36.7 C 36.7 C    Last Pain:  Vitals:   08/22/16 1420  TempSrc: Oral  PainSc:       Patients Stated Pain Goal: 8 (69/24/93 2419)  Complications: No apparent anesthesia complications

## 2016-08-22 NOTE — H&P (Signed)
   History and Physical Update  The patient was interviewed and re-examined.  The patient's previous History and Physical has been reviewed and is unchanged from recent office visit. Plan for right below knee incision.  Quinterius Gaida C. Donzetta Matters, MD [facility] [place] Office: [phone] Pager: [phone]   08/22/2016, 12:34 PM

## 2016-08-22 NOTE — Anesthesia Procedure Notes (Signed)
Anesthesia Regional Block: Popliteal block   Pre-Anesthetic Checklist: ,, timeout performed, Correct Patient, Correct Site, Correct Laterality, Correct Procedure, Correct Position, site marked, Risks and benefits discussed,  Surgical consent,  Pre-op evaluation,  At surgeon's request and post-op pain management  Laterality: Right and Lower  Prep: chloraprep       Needles:  Injection technique: Single-shot  Needle Type: Echogenic Stimulator Needle          Additional Needles:   Procedures: ultrasound guided, nerve stimulator,,,,,,   Nerve Stimulator or Paresthesia:  Response: plantar, 0.5 mA,   Additional Responses:   Narrative:  Start time: 08/22/2016 12:47 PM End time: 08/22/2016 12:51 PM Injection made incrementally with aspirations every 5 mL.  Performed by: Personally  Anesthesiologist: Bertram Haddix  Additional Notes: H+P and labs reviewed, risks and benefits discussed with patient, procedure tolerated well without complications

## 2016-08-22 NOTE — Anesthesia Procedure Notes (Signed)
Procedure Name: LMA Insertion Date/Time: 08/22/2016 1:14 PM Performed by: Freddie Breech Pre-anesthesia Checklist: Patient identified, Emergency Drugs available, Suction available and Patient being monitored Patient Re-evaluated:Patient Re-evaluated prior to induction Oxygen Delivery Method: Circle System Utilized Preoxygenation: Pre-oxygenation with 100% oxygen Induction Type: IV induction Ventilation: Mask ventilation without difficulty LMA: LMA inserted LMA Size: 5.0 Number of attempts: 1 Airway Equipment and Method: Bite block Placement Confirmation: positive ETCO2 Tube secured with: Tape Dental Injury: Teeth and Oropharynx as per pre-operative assessment

## 2016-08-22 NOTE — Anesthesia Preprocedure Evaluation (Signed)
Anesthesia Evaluation  Patient identified by MRN, date of birth, ID band Patient awake    Reviewed: Allergy & Precautions, NPO status , Patient's Chart, lab work & pertinent test results, reviewed documented beta blocker date and time   History of Anesthesia Complications Negative for: history of anesthetic complications  Airway Mallampati: II  TM Distance: >3 FB Neck ROM: Full    Dental  (+) Edentulous Upper, Edentulous Lower, Dental Advisory Given   Pulmonary asthma , former smoker,    breath sounds clear to auscultation       Cardiovascular hypertension, Pt. on medications and Pt. on home beta blockers + Peripheral Vascular Disease and +CHF   Rhythm:Regular  ECG: SR, rate 91 ECHO:  - Left ventricle: The cavity size was normal. There was mild hypertrophy of the septum with moderate hypertrophy of the posterior wall. Systolic function was normal. The estimated ejection fraction was in the range of 50% to 55%. Wall motion was normal; there were no regional wall motion abnormalities. Features are consistent with a pseudonormal left ventricular filling pattern, with concomitant abnormal relaxation and increased filling pressure (grade 2 diastolic dysfunction). Doppler parameters are consistent with high ventricular filling   pressure. - Aortic valve: Transvalvular velocity was within the normal range.   There was no stenosis. There was no regurgitation. - Mitral valve: Transvalvular velocity was within the normal range.   There was no evidence for stenosis. There was no regurgitation. - Right ventricle: The cavity size was normal. Wall thickness was   normal. Systolic function was normal. - Tricuspid valve: There was trivial regurgitation. - Pulmonary arteries: Systolic pressure was within the normal   range. PA peak pressure: 34 mm Hg (S).   Neuro/Psych  Neuromuscular disease CVA, No Residual Symptoms negative psych ROS   GI/Hepatic negative GI ROS, (+)     substance abuse  , Hepatitis -, C  Endo/Other  diabetes, Type 2, Insulin Dependent, Oral Hypoglycemic Agents  Renal/GU CRFRenal disease     Musculoskeletal negative musculoskeletal ROS (+)   Abdominal   Peds negative pediatric ROS (+)  Hematology  (+) anemia ,   Anesthesia Other Findings Gout Hyperlipidemia  Reproductive/Obstetrics negative OB ROS                             Anesthesia Physical Anesthesia Plan  ASA: III  Anesthesia Plan: General and Regional   Post-op Pain Management:    Induction: Intravenous  PONV Risk Score and Plan: 2 and Ondansetron and Dexamethasone  Airway Management Planned: LMA  Additional Equipment: None  Intra-op Plan:   Post-operative Plan: Extubation in OR  Informed Consent: I have reviewed the patients History and Physical, chart, labs and discussed the procedure including the risks, benefits and alternatives for the proposed anesthesia with the patient or authorized representative who has indicated his/her understanding and acceptance.   Dental advisory given  Plan Discussed with: CRNA and Surgeon  Anesthesia Plan Comments:         Anesthesia Quick Evaluation

## 2016-08-22 NOTE — Op Note (Signed)
    Patient name: Cory Weber MRN: [id number] DOB: 1950/11/08 Sex: male  08/22/2016 Pre-operative Diagnosis: necrotic wound of right Lisfranc amputation Post-operative diagnosis:  Same Surgeon:  Eda Paschal. Donzetta Matters, MD Assistant: Gerri Lins, PA Procedure Performed: Right below knee amputation  Indications:  66 year old male history of a left-sided below-knee amputation now has a right sided Lisfranc amputation that has failed to heal and is indicated for a transtibial amputation on the right.  Findings: There was adequate bleeding tissue below the knee. All muscle was viable and anterior and posterior flaps approximated well.   Procedure:  The patient was identified in the holding area and taken to the operating room where he was placed supine on the operating table and general anesthesia was induced. He was sterilely prepped and draped in the right lower extremity and timeout called. Antibiotics were administered within 30 minutes of incision. We marked out a standard two thirds, one third below-knee amputation. We then exsanguinated the right lower extremity with Esmarch and inflated our tourniquet. Total tourniquet time was 8 minutes. We then traced our previous marked incision with a #10 blade down the level of the bone and creating a posterior flap. The soft tissue was divided with electrocautery. Periosteum of the tibia was elevated and a Gigli saw was used to transect the tibia with an anterior bevel. The fibula was then divided with bone clip. This was done 1 cm higher than the tibial transection point. Posterior flap was created using amputation knife. Tourniquet was allowed down the vessels were clipped and suture ligated. Tibial nerve was pulled on tension tied off with silk suture and divided. Similarly the sural nerve and small saphenous vein were tied and divided. The wound was irrigated and hemostasis obtained. The bones were smoothed on the edges. Fascia was reapproximated with 2-0  Vicryl suture and the skin with staples. Patient was then allowed awaken from anesthesia having tolerated the procedure well without immediate complication. All counts were correct at completion.     Tauno Falotico C. Donzetta Matters, MD [facility] [place] Office: [phone] Pager: [phone]

## 2016-08-22 NOTE — Progress Notes (Signed)
Patient arrived on the unit from PACU assessment completed see flowsheet, patient oriented to room and staff, placed on tele ccmd notified, bed in lowest position, call bell within reach will continue to monitor.

## 2016-08-23 ENCOUNTER — Encounter (HOSPITAL_COMMUNITY): Payer: Self-pay | Admitting: Vascular Surgery

## 2016-08-23 LAB — BASIC METABOLIC PANEL
ANION GAP: 6 (ref 5–15)
BUN: 18 mg/dL (ref 6–20)
CHLORIDE: 98 mmol/L — AB (ref 101–111)
CO2: 25 mmol/L (ref 22–32)
Calcium: 8.3 mg/dL — ABNORMAL LOW (ref 8.9–10.3)
Creatinine, Ser: 1.16 mg/dL (ref 0.61–1.24)
GFR calc non Af Amer: >60 mL/min (ref >60)
Glucose, Bld: 270 mg/dL — ABNORMAL HIGH (ref 65–99)
Potassium: 4.3 mmol/L (ref 3.5–5.1)
Sodium: 129 mmol/L — ABNORMAL LOW (ref 135–145)

## 2016-08-23 LAB — CBC
HCT: 21.4 % — ABNORMAL LOW (ref 39.0–52.0)
HEMOGLOBIN: 6.8 g/dL — AB (ref 13.0–17.0)
MCH: 26.9 pg (ref 26.0–34.0)
MCHC: 31.8 g/dL (ref 30.0–36.0)
MCV: 84.6 fL (ref 78.0–100.0)
Platelets: 449 10*3/uL — ABNORMAL HIGH (ref 150–400)
RBC: 2.53 MIL/uL — AB (ref 4.22–5.81)
RDW: 14.3 % (ref 11.5–15.5)
WBC: 12.3 10*3/uL — ABNORMAL HIGH (ref 4.0–10.5)

## 2016-08-23 LAB — GLUCOSE, CAPILLARY
GLUCOSE-CAPILLARY: 244 mg/dL — AB (ref 65–99)
GLUCOSE-CAPILLARY: 146 mg/dL — AB (ref 65–99)
Glucose-Capillary: 121 mg/dL — ABNORMAL HIGH (ref 65–99)
Glucose-Capillary: 66 mg/dL (ref 65–99)
Glucose-Capillary: 121 mg/dL — ABNORMAL HIGH (ref 65–99)

## 2016-08-23 LAB — MRSA PCR SCREENING: MRSA by PCR: POSITIVE — AB

## 2016-08-23 LAB — PREPARE RBC (CROSSMATCH)

## 2016-08-23 MED ORDER — SODIUM CHLORIDE 0.9 % IV SOLN
Freq: Once | INTRAVENOUS | Status: AC
  Administered 2016-08-23: 07:00:00 via INTRAVENOUS

## 2016-08-23 NOTE — Progress Notes (Signed)
Pt AM Hgb 6.8. Pt currently asymptomatic. Doctor Bridgett Larsson notified. Verbal order given to admininster 2 units PRBC. Will continue to monitor. Isac Caddy, RN

## 2016-08-23 NOTE — Progress Notes (Signed)
  Progress Note    08/23/2016 8:17 AM 1 Day Post-Op  Subjective:  No complaints this a.m.  Vitals:   08/23/16 0734 08/23/16 0759  BP: (!) 121/54 121/77  Pulse:  (!) 103  Resp: 18 15  Temp: 98.8 F (37.1 C) 98.5 F (36.9 C)    Physical Exam: Awake and alert Non labored respirations Abdomen is soft R bka dressing cdi   CBC    Component Value Date/Time   WBC 12.3 (H) 08/23/2016 0225   RBC 2.53 (L) 08/23/2016 0225   HGB 6.8 (LL) 08/23/2016 0225   HCT 21.4 (L) 08/23/2016 0225   HCT 26.3 (L) 11/26/2015 1440   HCT 32 10/31/2011 1319   PLT 449 (H) 08/23/2016 0225   MCV 84.6 08/23/2016 0225   MCV 82.8 10/31/2011 1319   MCH 26.9 08/23/2016 0225   MCHC 31.8 08/23/2016 0225   RDW 14.3 08/23/2016 0225   LYMPHSABS 1.9 07/18/2016 0431   MONOABS 1.2 (H) 07/18/2016 0431   EOSABS 0.1 07/18/2016 0431   BASOSABS 0.0 07/18/2016 0431    BMET    Component Value Date/Time   NA 129 (L) 08/23/2016 0225   NA 134 (A) 12/27/2015   K 4.3 08/23/2016 0225   K 4.0 10/31/2011 1319   CL 98 (L) 08/23/2016 0225   CO2 25 08/23/2016 0225   GLUCOSE 270 (H) 08/23/2016 0225   BUN 18 08/23/2016 0225   BUN 16 12/27/2015   CREATININE 1.16 08/23/2016 0225   CREATININE 1.06 12/22/2015 1551   CALCIUM 8.3 (L) 08/23/2016 0225   CALCIUM 9.0 10/31/2011 1319   GFRNONAA >60 08/23/2016 0225   GFRAA >60 08/23/2016 0225    INR    Component Value Date/Time   INR 1.12 06/10/2016 1316     Intake/Output Summary (Last 24 hours) at 08/23/16 0817 Last data filed at 08/23/16 0511  Gross per 24 hour  Intake             1900 ml  Output             1225 ml  Net              675 ml     Assessment:  66 y.o. male is pod#1 right bka, h/h down this morning from acute blood loss anemia  Plan: Transfuse 2 units prbc's today PT Dressing down tomorrow Heparin subq dvt ppx   Ashleigh Luckow C. Donzetta Matters, MD [facility] [place] Office: [phone] Pager: [phone]  08/23/2016 8:17  AM

## 2016-08-23 NOTE — Progress Notes (Signed)
Thank you for consult on Cory Weber. He's well known to [place] from prior stay earlier this year. He reports that he had to move in to [place] due to inability to care for himself and no family support. Will defer CIR consult and recommended follow up therapy at [facility] after discharge.

## 2016-08-23 NOTE — Anesthesia Postprocedure Evaluation (Signed)
Anesthesia Post Note  Patient: Cory Weber  Procedure(s) Performed: Procedure(s) (LRB): AMPUTATION BELOW KNEE-RIGHT (Right)     Patient location during evaluation: PACU Anesthesia Type: Regional and General Level of consciousness: awake and alert Pain management: pain level controlled Vital Signs Assessment: post-procedure vital signs reviewed and stable Respiratory status: spontaneous breathing, nonlabored ventilation, respiratory function stable and patient connected to nasal cannula oxygen Cardiovascular status: blood pressure returned to baseline and stable Postop Assessment: no signs of nausea or vomiting Anesthetic complications: no    Last Vitals:  Vitals:   08/23/16 0734 08/23/16 0759  BP: (!) 121/54 121/77  Pulse:  (!) 103  Resp: 18 15  Temp: 37.1 C 36.9 C    Last Pain:  Vitals:   08/23/16 0759  TempSrc: Axillary  PainSc:                  Cory Weber

## 2016-08-23 NOTE — Progress Notes (Signed)
Inpatient Diabetes Program Recommendations  AACE/ADA: New Consensus Statement on Inpatient Glycemic Control (2015)  Target Ranges:  Prepandial:   less than 140 mg/dL      Peak postprandial:   less than 180 mg/dL (1-2 hours)      Critically ill patients:  140 - 180 mg/dL   Review of Glycemic Control  Diabetes history: DM 2 Outpatient Diabetes medications: Lantus 25, Nov 2-8 units tid + 5 MC, Metformin 500 mg Daily Current orders for Inpatient glycemic control: Lantus 25 units QHS, Novolog Sensitive Correction 0-9 units tid + Novolog 5 units meal coverage, Metformin 500 mg Daily  Inpatient Diabetes Program Recommendations:    Glucose 200's this am. Patient received Decadron 5 mg yest.  Trends should come back down. No changes to regimen today.  Thanks,  Tama Headings RN, MSN, [facility] Inpatient Diabetes Coordinator Team Pager [phone] (8a-5p)

## 2016-08-23 NOTE — Evaluation (Signed)
Physical Therapy Evaluation Patient Details Name: Cory Weber MRN: [id number] DOB: [birth year] Today's Date: 08/23/2016   History of Present Illness  Cory Weber is a 66 y.o. male with medical history significant of asthma, BPH, CAD, CVA, hepatitis C, hyperlipidemia, hypertension, peripheral vascular disease, diabetes, left BKA. Pt Of note patient was discharged from the hospital  on 05/31/2016 after treatment for right ischemic foot secondary to severe PVD. During admission patient underwent arthrectomy of the right SFA and had a drug-coated balloon angioplasty with stent placement. Patient was started on Plavix and aspirin at that time. Patient states his right foot has been in severe pain since time of his previous admission with no improvement. 06/13/16 R TMA    Clinical Impression  Patient is s/p above surgery resulting in functional limitations due to the deficits listed below (see PT Problem List). Pt is min guard for bed mobility and minA for anterior/posterior scoot to recliner. Patient will benefit from skilled PT to increase their independence and safety with mobility to allow discharge to the venue listed below.       Follow Up Recommendations SNF    Equipment Recommendations  None recommended by PT    Recommendations for Other Services OT consult     Precautions / Restrictions Restrictions Weight Bearing Restrictions: Yes RLE Weight Bearing: Non weight bearing      Mobility  Bed Mobility Overal bed mobility: Weber Assistance Bed Mobility: Supine to Sit     Supine to sit: Min guard     General bed mobility comments: min guard for safety  Transfers Overall transfer level: Weber assistance Equipment used: None Transfers: Comptroller transfers: Min assist   General transfer comment: minA for pad scoot to aid hips into recliner, good UE strength to aid in movement, vc for hand placement and sequencing       Balance Overall balance assessment: Weber assistance Sitting-balance support: Feet unsupported;No upper extremity supported Sitting balance-Leahy Scale: Good Sitting balance - Comments: able to reach outside his BoS                                      Pertinent Vitals/Pain Pain Assessment: 0-10 Pain Score: 8  Pain Location: R LE surgical site Pain Descriptors / Indicators: Grimacing;Guarding;Operative site guarding Pain Intervention(s): Limited activity within patient's tolerance;Monitored during session;Patient requesting pain meds-RN notified    Home Living Family/patient expects to be discharged to:: Skilled nursing facility                      Prior Function Level of Independence: Independent with assistive device(s)         Comments: uses wc for propulsion     Hand Dominance   Dominant Hand: Right    Extremity/Trunk Assessment   Upper Extremity Assessment Upper Extremity Assessment: Overall WFL for tasks assessed    Lower Extremity Assessment Lower Extremity Assessment: RLE deficits/detail;LLE deficits/detail RLE Deficits / Details: R BKA, R hip strength grossly 3/5 due to residual anesthetic and knee 4/5, ROM WFL  RLE: Unable to fully assess due to pain RLE Sensation: decreased light touch (in quadriceps) LLE Deficits / Details: L BKA, strength grossly 4/5, ROM WFL    Cervical / Trunk Assessment Cervical / Trunk Assessment: Normal  Communication   Communication: No difficulties  Cognition Arousal/Alertness: Awake/alert Behavior During Therapy: WFL for tasks assessed/performed  Overall Cognitive Status: Within Functional Limits for tasks assessed                                        General Comments General comments (skin integrity, edema, etc.): VSS     PT Assessment Patient Weber continued PT services  PT Problem List Decreased strength;Decreased range of motion;Decreased mobility;Pain;Impaired  sensation       PT Treatment Interventions DME instruction;Wheelchair mobility training;Patient/family education;Balance training;Therapeutic exercise;Therapeutic activities;Functional mobility training    PT Goals (Current goals can be found in the Care Plan section)  Acute Rehab PT Goals Patient Stated Goal: get back to rehab PT Goal Formulation: With patient Time For Goal Achievement: 08/30/16 Potential to Achieve Goals: Good    Frequency Min 2X/week    AM-PAC PT "6 Clicks" Daily Activity  Outcome Measure Difficulty turning over in bed (including adjusting bedclothes, sheets and blankets)?: A Little Difficulty moving from lying on back to sitting on the side of the bed? : A Little Difficulty sitting down on and standing up from a chair with arms (e.g., wheelchair, bedside commode, etc,.)?: Total Help needed moving to and from a bed to chair (including a wheelchair)?: A Little Help needed walking in hospital room?: Total Help needed climbing 3-5 steps with a railing? : Total 6 Click Score: 12    End of Session   Activity Tolerance: Patient tolerated treatment well Patient left: in chair;with call bell/phone within reach Nurse Communication: Mobility status;Patient requests pain meds PT Visit Diagnosis: Other abnormalities of gait and mobility (R26.89);Pain Pain - Right/Left: Right Pain - part of body: Leg    Time: 8295-6213 PT Time Calculation (min) (ACUTE ONLY): 39 min   Charges:   PT Evaluation $PT Eval Low Complexity: 1 Procedure PT Treatments $Therapeutic Activity: 23-37 mins   PT G Codes:        Juliona Vales B. Migdalia Dk PT, DPT Acute Rehabilitation  [phone] Pager [phone]    [place] 08/23/2016, 12:13 PM

## 2016-08-23 NOTE — Progress Notes (Signed)
Physical Therapy Treatment Patient Details Name: Cory Weber MRN: [id number] DOB: [birth year] Today's Date: 08/23/2016    History of Present Illness Cory Weber is a 66 y.o. male with medical history significant of asthma, BPH, CAD, CVA, hepatitis C, hyperlipidemia, hypertension, peripheral vascular disease, diabetes, left BKA. Cory Of note patient was discharged from the hospital  on 05/31/2016 after treatment for right ischemic foot secondary to severe PVD. During admission patient underwent arthrectomy of the right SFA and had a drug-coated balloon angioplasty with stent placement. Patient was started on Plavix and aspirin at that time. Patient states his right foot has been in severe pain since time of his previous admission with no improvement. 06/13/16 R TMA      Cory Weber    Cory sat up in chair through lunch but was feeling fatigued and nursing asked for assist back to bed. Cory continues to exhibit safe, steady transfers even in fatigued state and was able to perform anterior/posterior scoot from recliner back into the bed. Cory requires skilled Cory to progress transfers to wheelchair for safe mobility in his discharge environment.     Follow Up Recommendations  SNF     Equipment Recommendations  None recommended by Cory    Recommendations for Other Services OT consult     Precautions / Restrictions Restrictions Weight Bearing Restrictions: Yes RLE Weight Bearing: Non weight bearing    Mobility  Bed Mobility Overal bed mobility: Needs Assistance Bed Mobility: Sit to Supine     Supine to sit: Min guard Sit to supine: Supervision;HOB elevated   General bed mobility Weber: supervision, vc for scooting hips to square in the bed before laying back  Transfers Overall transfer level: Needs assistance Equipment used: None Transfers: Comptroller transfers: Min assist   General transfer comment: minA for pad scoot to aid hips  back onto the bed, Cory with good reciprocal hip advancement and UE offweighting of hips to mobilize back onto the bed      Balance Overall balance assessment: Needs assistance Sitting-balance support: Feet unsupported;No upper extremity supported Sitting balance-Leahy Scale: Good Sitting balance - Weber: able to reach outside his BoS                                     Cognition Arousal/Alertness: Awake/alert Behavior During Therapy: WFL for tasks assessed/performed Overall Cognitive Status: Within Functional Limits for tasks assessed                                           General Weber General Weber (skin integrity, edema, etc.): VSS      Pertinent Vitals/Pain Pain Assessment: 0-10 Pain Score: 5  Pain Location: R LE surgical site Pain Descriptors / Indicators: Grimacing;Guarding;Operative site guarding Pain Intervention(s): Monitored during session    Home Living Family/patient expects to be discharged to:: Skilled nursing facility                    Prior Function Level of Independence: Independent with assistive device(s)      Weber: uses wc for propulsion   Cory Goals (current goals can now be found in the care plan section) Acute Rehab Cory Goals Patient Stated Goal: get back to rehab Cory Goal Formulation: With patient Time For  Goal Achievement: 08/30/16 Potential to Achieve Goals: Good Additional Goals Additional Goal #1: Cory will perform sliding board transfer to wheelchair, with supervision.    Frequency    Min 2X/week      Cory Plan Current plan remains appropriate       AM-PAC Cory "6 Clicks" Daily Activity  Outcome Measure  Difficulty turning over in bed (including adjusting bedclothes, sheets and blankets)?: A Little Difficulty moving from lying on back to sitting on the side of the bed? : A Little Difficulty sitting down on and standing up from a chair with arms (e.g., wheelchair, bedside commode,  etc,.)?: Total Help needed moving to and from a bed to chair (including a wheelchair)?: A Little Help needed walking in hospital room?: Total Help needed climbing 3-5 steps with a railing? : Total 6 Click Score: 12    End of Session   Activity Tolerance: Patient tolerated treatment well Patient left: in chair;with call bell/phone within reach Nurse Communication: Mobility status;Patient requests pain meds Cory Visit Diagnosis: Other abnormalities of gait and mobility (R26.89);Pain Pain - Right/Left: Right Pain - part of body: Leg     Time: 5670-1410 Cory Time Calculation (min) (ACUTE ONLY): 14 min  Charges:  $Therapeutic Activity: 8-22 mins                    G Codes:       Cory Weber B. Migdalia Dk Cory, DPT Acute Rehabilitation  [phone] Pager [phone]     [place] 08/23/2016, 2:00 PM

## 2016-08-24 ENCOUNTER — Inpatient Hospital Stay (HOSPITAL_COMMUNITY): Payer: Medicaid Other

## 2016-08-24 LAB — CBC
HCT: 28.7 % — ABNORMAL LOW (ref 39.0–52.0)
Hemoglobin: 9.1 g/dL — ABNORMAL LOW (ref 13.0–17.0)
MCH: 27.1 pg (ref 26.0–34.0)
MCHC: 31.7 g/dL (ref 30.0–36.0)
MCV: 85.4 fL (ref 78.0–100.0)
PLATELETS: 475 10*3/uL — AB (ref 150–400)
RBC: 3.36 MIL/uL — ABNORMAL LOW (ref 4.22–5.81)
RDW: 14.6 % (ref 11.5–15.5)
WBC: 12.8 10*3/uL — AB (ref 4.0–10.5)

## 2016-08-24 LAB — TYPE AND SCREEN
ABO/RH(D): O POS
ANTIBODY SCREEN: NEGATIVE
UNIT DIVISION: 0
UNIT DIVISION: 0

## 2016-08-24 LAB — GLUCOSE, CAPILLARY
Glucose-Capillary: 131 mg/dL — ABNORMAL HIGH (ref 65–99)
Glucose-Capillary: 226 mg/dL — ABNORMAL HIGH (ref 65–99)
Glucose-Capillary: 171 mg/dL — ABNORMAL HIGH (ref 65–99)
Glucose-Capillary: 77 mg/dL (ref 65–99)

## 2016-08-24 LAB — BASIC METABOLIC PANEL
ANION GAP: 6 (ref 5–15)
BUN: 13 mg/dL (ref 6–20)
CALCIUM: 8.5 mg/dL — AB (ref 8.9–10.3)
CHLORIDE: 99 mmol/L — AB (ref 101–111)
CO2: 26 mmol/L (ref 22–32)
Creatinine, Ser: 1.01 mg/dL (ref 0.61–1.24)
GFR calc non Af Amer: >60 mL/min (ref >60)
Glucose, Bld: 237 mg/dL — ABNORMAL HIGH (ref 65–99)
POTASSIUM: 4.2 mmol/L (ref 3.5–5.1)
Sodium: 131 mmol/L — ABNORMAL LOW (ref 135–145)

## 2016-08-24 LAB — URINALYSIS, COMPLETE (UACMP) WITH MICROSCOPIC
BACTERIA UA: NONE SEEN
BILIRUBIN URINE: NEGATIVE
Glucose, UA: 50 mg/dL — AB
HGB URINE DIPSTICK: NEGATIVE
KETONES UR: NEGATIVE mg/dL
NITRITE: NEGATIVE
Protein, ur: NEGATIVE mg/dL
SPECIFIC GRAVITY, URINE: 1.011 (ref 1.005–1.030)
pH: 5 (ref 5.0–8.0)

## 2016-08-24 LAB — BPAM RBC
BLOOD PRODUCT EXPIRATION DATE: 201807202359
Blood Product Expiration Date: 201807202359
ISSUE DATE / TIME: 201807131123
ISSUE DATE / TIME: 201807130723
UNIT TYPE AND RH: 5100
Unit Type and Rh: 5100

## 2016-08-24 MED ORDER — HYDROMORPHONE HCL 1 MG/ML IJ SOLN
1.0000 mg | INTRAMUSCULAR | Status: DC | PRN
  Administered 2016-08-24 – 2016-08-25 (×3): 1 mg via INTRAVENOUS
  Filled 2016-08-24 (×4): qty 1

## 2016-08-24 MED ORDER — HYDROMORPHONE HCL 1 MG/ML IJ SOLN
1.0000 mg | INTRAMUSCULAR | Status: DC | PRN
  Administered 2016-08-24: 1 mg via INTRAVENOUS
  Filled 2016-08-24: qty 1

## 2016-08-24 NOTE — Progress Notes (Addendum)
  Progress Note  SUBJECTIVE:    POD #2  Having pain right BKA  OBJECTIVE:   Vitals:   08/24/16 0626 08/24/16 0759  BP: (!) 158/80   Pulse: (!) 101 (!) 115  Resp: 15   Temp: (!) 100.5 F (38.1 C)     Intake/Output Summary (Last 24 hours) at 08/24/16 0913 Last data filed at 08/23/16 2041  Gross per 24 hour  Intake              670 ml  Output             1850 ml  Net            -1180 ml  Sitting on side of bed, reporting pain. Non labored breathing.  Right BKA staples intact with no active bleeding. Moderately edematous.   ASSESSMENT/PLAN:   66 y.o. male is s/p: right BKA 2 Days Post-Op   Beginning daily dressing changes with gentle compression. ABLA: received 2 units of prbcs yesterday. CBC this am pending.  Temp of 100.5 this am. Incision appears clean. Mild leukocytosis yesterday. CBC pending. Probably post-op atelectasis. Will check UA and CXR. Will follow fever trend. Incentive spirometry.  CSW for SNF placement  Cory Weber 08/24/2016 9:13 AM -- LABS:   CBC    Component Value Date/Time   WBC 12.3 (H) 08/23/2016 0225   HGB 6.8 (LL) 08/23/2016 0225   HCT 21.4 (L) 08/23/2016 0225   HCT 26.3 (L) 11/26/2015 1440   HCT 32 10/31/2011 1319   PLT 449 (H) 08/23/2016 0225    BMET    Component Value Date/Time   NA 129 (L) 08/23/2016 0225   NA 134 (A) 12/27/2015   K 4.3 08/23/2016 0225   K 4.0 10/31/2011 1319   CL 98 (L) 08/23/2016 0225   CO2 25 08/23/2016 0225   GLUCOSE 270 (H) 08/23/2016 0225   BUN 18 08/23/2016 0225   BUN 16 12/27/2015   CREATININE 1.16 08/23/2016 0225   CREATININE 1.06 12/22/2015 1551   CALCIUM 8.3 (L) 08/23/2016 0225   CALCIUM 9.0 10/31/2011 1319   GFRNONAA >60 08/23/2016 0225   GFRAA >60 08/23/2016 0225    COAG Lab Results  Component Value Date   INR 1.12 06/10/2016   INR 1.17 05/31/2016   INR 1.2 (H) 10/19/2015   No results found for: PTT  ANTIBIOTICS:   Anti-infectives    Start     Dose/Rate Route Frequency  Ordered Stop   08/23/16 0100  cefUROXime (ZINACEF) 1.5 g in dextrose 5 % 50 mL IVPB     1.5 g 100 mL/hr over 30 Minutes Intravenous Every 12 hours 08/22/16 1623 08/23/16 1459   08/22/16 1018  dextrose 5 % with cefUROXime (ZINACEF) ADS Med    Comments:  Connye Burkitt   : cabinet override      08/22/16 1018 08/22/16 1315   08/22/16 1017  cefUROXime (ZINACEF) 1.5 g in dextrose 5 % 50 mL IVPB     1.5 g 100 mL/hr over 30 Minutes Intravenous 30 min pre-op 08/22/16 1017 08/22/16 1345       [name], PA-C [facility] Office: [phone] Pager: [phone] 08/24/2016 9:13 AM   I have interviewed patient with PA and agree with assessment and plan above. Cbc demonstrates good response to transfusion yesterday. Wound progressing as expected with staples in place.   Cory Weber C. Donzetta Matters, MD [facility] [place] Office: [phone] Pager: [phone]

## 2016-08-25 LAB — CBC
HCT: 27.2 % — ABNORMAL LOW (ref 39.0–52.0)
Hemoglobin: 8.4 g/dL — ABNORMAL LOW (ref 13.0–17.0)
MCH: 26.8 pg (ref 26.0–34.0)
MCHC: 30.9 g/dL (ref 30.0–36.0)
MCV: 86.9 fL (ref 78.0–100.0)
PLATELETS: 464 10*3/uL — AB (ref 150–400)
RBC: 3.13 MIL/uL — ABNORMAL LOW (ref 4.22–5.81)
RDW: 14.7 % (ref 11.5–15.5)
WBC: 11.6 10*3/uL — AB (ref 4.0–10.5)

## 2016-08-25 LAB — URINE CULTURE: CULTURE: NO GROWTH

## 2016-08-25 LAB — BASIC METABOLIC PANEL
ANION GAP: 5 (ref 5–15)
BUN: 17 mg/dL (ref 6–20)
CALCIUM: 8.3 mg/dL — AB (ref 8.9–10.3)
CO2: 27 mmol/L (ref 22–32)
CREATININE: 0.97 mg/dL (ref 0.61–1.24)
Chloride: 100 mmol/L — ABNORMAL LOW (ref 101–111)
Glucose, Bld: 128 mg/dL — ABNORMAL HIGH (ref 65–99)
Potassium: 3.8 mmol/L (ref 3.5–5.1)
Sodium: 132 mmol/L — ABNORMAL LOW (ref 135–145)

## 2016-08-25 LAB — GLUCOSE, CAPILLARY
Glucose-Capillary: 125 mg/dL — ABNORMAL HIGH (ref 65–99)
Glucose-Capillary: 129 mg/dL — ABNORMAL HIGH (ref 65–99)
Glucose-Capillary: 108 mg/dL — ABNORMAL HIGH (ref 65–99)
Glucose-Capillary: 175 mg/dL — ABNORMAL HIGH (ref 65–99)

## 2016-08-25 MED ORDER — HYDROMORPHONE HCL 1 MG/ML IJ SOLN
2.0000 mg | INTRAMUSCULAR | Status: DC | PRN
  Administered 2016-08-27: 2 mg via INTRAVENOUS
  Filled 2016-08-25: qty 2

## 2016-08-25 NOTE — Progress Notes (Addendum)
  Progress Note  SUBJECTIVE:    POD #3  Still having pain right BKA.  OBJECTIVE:   Vitals:   08/25/16 0500 08/25/16 0823  BP: 120/76 117/67  Pulse:  96  Resp:  14  Temp: 98.1 F (36.7 C) 98.3 F (36.8 C)    Intake/Output Summary (Last 24 hours) at 08/25/16 0826 Last data filed at 08/25/16 0811  Gross per 24 hour  Intake             1080 ml  Output             2350 ml  Net            -1270 ml   Right BKA without any active drainage. Moderate edema.   ASSESSMENT/PLAN:   66 y.o. male is s/p: right BKA 3 Days Post-Op   H/H stable. Afebrile today. CXR negative. UA negative.   Pain still an issue. Has chronic pain issues at baseline. Already on MS contin 15 mg q 12, oxycodone 10-15 mg q 4 prn, IV morphine 1-3 q hr prn,  dilaudid 1 mg q 2 prn.   Will d/c IV morphine and increase breakthrough dose of dilaudid.    Plan potential d/c back to SNF once pain is better controlled.   [name] 08/25/2016 8:26 AM -- LABS:   CBC    Component Value Date/Time   WBC 11.6 (H) 08/25/2016 0249   HGB 8.4 (L) 08/25/2016 0249   HCT 27.2 (L) 08/25/2016 0249   HCT 26.3 (L) 11/26/2015 1440   HCT 32 10/31/2011 1319   PLT 464 (H) 08/25/2016 0249    BMET    Component Value Date/Time   NA 132 (L) 08/25/2016 0249   NA 134 (A) 12/27/2015   K 3.8 08/25/2016 0249   K 4.0 10/31/2011 1319   CL 100 (L) 08/25/2016 0249   CO2 27 08/25/2016 0249   GLUCOSE 128 (H) 08/25/2016 0249   BUN 17 08/25/2016 0249   BUN 16 12/27/2015   CREATININE 0.97 08/25/2016 0249   CREATININE 1.06 12/22/2015 1551   CALCIUM 8.3 (L) 08/25/2016 0249   CALCIUM 9.0 10/31/2011 1319   GFRNONAA >60 08/25/2016 0249   GFRAA >60 08/25/2016 0249    COAG Lab Results  Component Value Date   INR 1.12 06/10/2016   INR 1.17 05/31/2016   INR 1.2 (H) 10/19/2015   No results found for: PTT  ANTIBIOTICS:   Anti-infectives    Start     Dose/Rate Route Frequency Ordered Stop   08/23/16 0100  cefUROXime  (ZINACEF) 1.5 g in dextrose 5 % 50 mL IVPB     1.5 g 100 mL/hr over 30 Minutes Intravenous Every 12 hours 08/22/16 1623 08/23/16 1459   08/22/16 1018  dextrose 5 % with cefUROXime (ZINACEF) ADS Med    Comments:  Connye Burkitt   : cabinet override      08/22/16 1018 08/22/16 1315   08/22/16 1017  cefUROXime (ZINACEF) 1.5 g in dextrose 5 % 50 mL IVPB     1.5 g 100 mL/hr over 30 Minutes Intravenous 30 min pre-op 08/22/16 1017 08/22/16 1345       [name], PA-C [facility] Office: [phone] Pager: [phone] 08/25/2016 8:26 AM   I have independently interviewed patient and agree with PA assessment and plan above. dispo will be back to snf in 1-2 days.   Jadaya Sommerfield C. Donzetta Matters, MD [facility] [place] Office: [phone] Pager: [phone]

## 2016-08-26 LAB — CBC
HEMATOCRIT: 26.6 % — AB (ref 39.0–52.0)
Hemoglobin: 8 g/dL — ABNORMAL LOW (ref 13.0–17.0)
MCH: 26.5 pg (ref 26.0–34.0)
MCHC: 30.1 g/dL (ref 30.0–36.0)
MCV: 88.1 fL (ref 78.0–100.0)
PLATELETS: 423 10*3/uL — AB (ref 150–400)
RBC: 3.02 MIL/uL — AB (ref 4.22–5.81)
RDW: 14.7 % (ref 11.5–15.5)
WBC: 10.1 10*3/uL (ref 4.0–10.5)

## 2016-08-26 LAB — GLUCOSE, CAPILLARY
GLUCOSE-CAPILLARY: 116 mg/dL — AB (ref 65–99)
Glucose-Capillary: 117 mg/dL — ABNORMAL HIGH (ref 65–99)
Glucose-Capillary: 170 mg/dL — ABNORMAL HIGH (ref 65–99)
Glucose-Capillary: 151 mg/dL — ABNORMAL HIGH (ref 65–99)

## 2016-08-26 MED ORDER — CHLORHEXIDINE GLUCONATE CLOTH 2 % EX PADS
6.0000 | MEDICATED_PAD | Freq: Every day | CUTANEOUS | Status: DC
  Administered 2016-08-27: 6 via TOPICAL

## 2016-08-26 MED ORDER — MUPIROCIN 2 % EX OINT
1.0000 "application " | TOPICAL_OINTMENT | Freq: Two times a day (BID) | CUTANEOUS | Status: DC
  Administered 2016-08-26 – 2016-08-27 (×2): 1 via NASAL
  Filled 2016-08-26: qty 22

## 2016-08-26 MED ORDER — MAGNESIUM CITRATE PO SOLN
300.0000 mL | Freq: Once | ORAL | Status: AC
  Administered 2016-08-26: 300 mL via ORAL
  Filled 2016-08-26 (×2): qty 592

## 2016-08-26 NOTE — Progress Notes (Signed)
  Progress Note    08/26/2016 5:25 AM 4 Days Post-Op  Subjective:  Complains of sweating overnight, no chest pain or nausea, otherwise well  Vitals:   08/25/16 2019 08/26/16 0446  BP: (!) 144/89 (!) 144/74  Pulse: 87   Resp: 20 16  Temp: 98.3 F (36.8 C) 98 F (36.7 C)    Physical Exam: aaox3 rrr Right bka site cdi with staples  CBC    Component Value Date/Time   WBC 10.1 08/26/2016 0200   RBC 3.02 (L) 08/26/2016 0200   HGB 8.0 (L) 08/26/2016 0200   HCT 26.6 (L) 08/26/2016 0200   HCT 26.3 (L) 11/26/2015 1440   HCT 32 10/31/2011 1319   PLT 423 (H) 08/26/2016 0200   MCV 88.1 08/26/2016 0200   MCV 82.8 10/31/2011 1319   MCH 26.5 08/26/2016 0200   MCHC 30.1 08/26/2016 0200   RDW 14.7 08/26/2016 0200   LYMPHSABS 1.9 07/18/2016 0431   MONOABS 1.2 (H) 07/18/2016 0431   EOSABS 0.1 07/18/2016 0431   BASOSABS 0.0 07/18/2016 0431    BMET    Component Value Date/Time   NA 132 (L) 08/25/2016 0249   NA 134 (A) 12/27/2015   K 3.8 08/25/2016 0249   K 4.0 10/31/2011 1319   CL 100 (L) 08/25/2016 0249   CO2 27 08/25/2016 0249   GLUCOSE 128 (H) 08/25/2016 0249   BUN 17 08/25/2016 0249   BUN 16 12/27/2015   CREATININE 0.97 08/25/2016 0249   CREATININE 1.06 12/22/2015 1551   CALCIUM 8.3 (L) 08/25/2016 0249   CALCIUM 9.0 10/31/2011 1319   GFRNONAA >60 08/25/2016 0249   GFRAA >60 08/25/2016 0249    INR    Component Value Date/Time   INR 1.12 06/10/2016 1316     Intake/Output Summary (Last 24 hours) at 08/26/16 0525 Last data filed at 08/26/16 0344  Gross per 24 hour  Intake             1780 ml  Output             2950 ml  Net            -1170 ml     Assessment:  66 y.o. male is s/p R bka, has left bka  Plan: On asa and plavix subq heparin for dvt ppx dispo will be snf in [employer] C. Donzetta Matters, MD [facility] [place] Office: [phone] Pager: [phone]  08/26/2016 5:25 AM

## 2016-08-26 NOTE — Evaluation (Signed)
Occupational Therapy Evaluation  Patient Details Name: Cory Weber MRN: [id number] DOB: 01/03/1951 Today's Date: 08/26/2016    History of Present Illness Cory Weber is a 66 y.o. male with medical history significant of asthma, BPH, CAD, CVA, hepatitis C, hyperlipidemia, hypertension, peripheral vascular disease, diabetes, left BKA. S/P RBKA now   Clinical Impression   This 66 yo male admitted and underwent above presents to acute OT with deficits below (see OT problem list) thus affecting his PLOF of Mod I. He will benefit from acute OT with follow up OT at SNF to get back to Mod I  W/C level.     Follow Up Recommendations  SNF;Supervision/Assistance - 24 hour    Equipment Recommendations  None recommended by OT       Precautions / Restrictions Precautions Precautions: Fall Restrictions Weight Bearing Restrictions: Yes RLE Weight Bearing: Non weight bearing      Mobility Bed Mobility Overal bed mobility: Needs Assistance Bed Mobility: Rolling;Sidelying to Sit Rolling: Modified independent (Device/Increase time) (use of rails and increased time) Sidelying to sit: Min guard (use of rails and increase time)             Balance Overall balance assessment: Needs assistance Sitting-balance support: No upper extremity supported Sitting balance-Leahy Scale: Fair                                     ADL either performed or assessed with clinical judgement   ADL Overall ADL's : Needs assistance/impaired Eating/Feeding: Independent;Sitting (EOB)   Grooming: Set up;Sitting (EOB)   Upper Body Bathing: Set up;Sitting (EOB)   Lower Body Bathing: Minimal assistance;Sitting/lateral leans (EOB)   Upper Body Dressing : Set up;Sitting (EOB)   Lower Body Dressing: Moderate assistance;Sitting/lateral leans (EOB)                 General ADL Comments: Pt deferred trying and anterior/posterior transfer to 3n1 due to pain and pain meds not due as of  yet     Vision Patient Visual Report: No change from baseline              Pertinent Vitals/Pain Pain Assessment: Faces Faces Pain Scale: Hurts little more Pain Location: R LE surgical site Pain Descriptors / Indicators: Grimacing;Guarding;Operative site guarding Pain Intervention(s): Monitored during session;Repositioned (Asked RN about pain meds and they are not due until 12:30pm)     Hand Dominance Right   Extremity/Trunk Assessment Upper Extremity Assessment Upper Extremity Assessment: Overall WFL for tasks assessed           Communication Communication Communication: No difficulties   Cognition Arousal/Alertness: Awake/alert Behavior During Therapy: WFL for tasks assessed/performed Overall Cognitive Status: Within Functional Limits for tasks assessed                                                Home Living Family/patient expects to be discharged to:: Skilled nursing facility                                        Prior Functioning/Environment Level of Independence: Independent with assistive device(s)        Comments: uses wc for propulsion  OT Problem List: Impaired balance (sitting and/or standing);Pain      OT Treatment/Interventions: Self-care/ADL training;Patient/family education;Balance training;DME and/or AE instruction    OT Goals(Current goals can be found in the care plan section) Acute Rehab OT Goals Patient Stated Goal: get back to rehab OT Goal Formulation: With patient Time For Goal Achievement: 09/02/16 Potential to Achieve Goals: Good  OT Frequency: Min 2X/week              AM-PAC PT "6 Clicks" Daily Activity     Outcome Measure Help from another person eating meals?: A Little Help from another person taking care of personal grooming?: A Little Help from another person toileting, which includes using toliet, bedpan, or urinal?: A Lot Help from another person bathing (including  washing, rinsing, drying)?: A Little Help from another person to put on and taking off regular upper body clothing?: A Little Help from another person to put on and taking off regular lower body clothing?: A Lot 6 Click Score: 16   End of Session Nurse Communication:  (checked with nursing about OK for pt to stay sitting EOB since I could not set the alarm with him in this position--she felt it was OK and I agree)  Activity Tolerance: Patient tolerated treatment well Patient left:  (left sitting EOB where he asked to be)  OT Visit Diagnosis: Other abnormalities of gait and mobility (R26.89);Pain Pain - Right/Left: Right Pain - part of body: Leg                Time: 4599-7741 OT Time Calculation (min): 20 min Charges:  OT General Charges $OT Visit: 1 Procedure OT Evaluation $OT Eval Moderate Complexity: 1 Procedure [place], OTR/L [phone] 08/26/2016

## 2016-08-26 NOTE — Progress Notes (Signed)
Physical Therapy Treatment Patient Details Name: Cory Weber MRN: [id number] DOB: May 20, 1950 Today's Date: 08/26/2016    History of Present Illness Cory Weber is a 66 y.o. male with medical history significant of asthma, BPH, CAD, CVA, hepatitis C, hyperlipidemia, hypertension, peripheral vascular disease, diabetes, left BKA. Pt Of note patient was discharged from the hospital  on 05/31/2016 after treatment for right ischemic foot secondary to severe PVD. During admission patient underwent arthrectomy of the right SFA and had a drug-coated balloon angioplasty with stent placement. Patient was started on Plavix and aspirin at that time. Patient states his right foot has been in severe pain since time of his previous admission with no improvement. 06/13/16 R TMA      PT Comments    Pt with increased L LE weakness at beginning of session as pt reports he hasn't moved much all weekend. Pt educated in need to maintain strength in his L hip and knee for bed mobility and transfers and if he ever wants to progress to a prosthetic. Pt incision is healing well however residual limb did not have a shrinker or ace wrap on at time of visit which is needed to prevent the possible formation of dog ears. Nursing notified. Pt requires skilled PT to progress transfers and exercise program for LE strengthening so patient can safely mobilize in his discharge environment.     Follow Up Recommendations  SNF     Equipment Recommendations  None recommended by PT    Recommendations for Other Services OT consult     Precautions / Restrictions Restrictions Weight Bearing Restrictions: Yes RLE Weight Bearing: Non weight bearing    Mobility  Bed Mobility Overal bed mobility: Needs Assistance Bed Mobility: Sit to Supine       Sit to supine: Supervision;HOB elevated   General bed mobility comments: supervision, vc for scooting hips to square in the bed before laying back  Transfers Overall transfer  level: Needs assistance Equipment used: None Transfers: Comptroller transfers: Min assist   General transfer comment: minA for pad scoot to aid hips back into recliner         Balance Overall balance assessment: Needs assistance Sitting-balance support: Feet unsupported;No upper extremity supported Sitting balance-Leahy Scale: Good Sitting balance - Comments: able to reach outside his BoS                                     Cognition Arousal/Alertness: Awake/alert Behavior During Therapy: WFL for tasks assessed/performed Overall Cognitive Status: Within Functional Limits for tasks assessed                                        Exercises Amputee Exercises Quad Sets: AROM;Left;10 reps;Seated Gluteal Sets: AROM;Left;10 reps;Seated Hip ABduction/ADduction: AROM;Left;10 reps;Seated Hip Flexion/Marching: AROM;Left;10 reps;Seated Knee Flexion: AROM;Left;10 reps;Seated Knee Extension: AROM;Left;10 reps;Seated Straight Leg Raises: AROM;Both;10 reps;Seated    General Comments        Pertinent Vitals/Pain Pain Assessment: 0-10 Pain Score: 3  Pain Location: R LE surgical site Pain Descriptors / Indicators: Grimacing;Guarding Pain Intervention(s): Monitored during session  VSS           PT Goals (current goals can now be found in the care plan section) Acute Rehab PT Goals Patient Stated Goal: get back  to rehab PT Goal Formulation: With patient Time For Goal Achievement: 08/30/16 Potential to Achieve Goals: Good Progress towards PT goals: Progressing toward goals    Frequency    Min 2X/week      PT Plan Current plan remains appropriate       AM-PAC PT "6 Clicks" Daily Activity  Outcome Measure  Difficulty turning over in bed (including adjusting bedclothes, sheets and blankets)?: A Little Difficulty moving from lying on back to sitting on the side of the bed? : A Little Difficulty  sitting down on and standing up from a chair with arms (e.g., wheelchair, bedside commode, etc,.)?: Total Help needed moving to and from a bed to chair (including a wheelchair)?: A Little Help needed walking in hospital room?: Total Help needed climbing 3-5 steps with a railing? : Total 6 Click Score: 12    End of Session   Activity Tolerance: Patient tolerated treatment well Patient left: in chair;with call bell/phone within reach Nurse Communication: Mobility status;Patient requests pain meds PT Visit Diagnosis: Other abnormalities of gait and mobility (R26.89);Pain Pain - Right/Left: Right Pain - part of body: Leg     Time: 1536-1610 PT Time Calculation (min) (ACUTE ONLY): 34 min  Charges:  $Therapeutic Exercise: 8-22 mins $Therapeutic Activity: 8-22 mins                    G Codes:       Joette Schmoker B. Migdalia Dk PT, DPT Acute Rehabilitation  [phone] Pager [phone]     [place] 08/26/2016, 4:23 PM

## 2016-08-26 NOTE — NC FL2 (Signed)
[place] LEVEL OF CARE SCREENING TOOL     IDENTIFICATION  Patient Name: Cory Weber Birthdate: 1950/06/12 Sex: male Admission Date (Current Location): 08/22/2016  [facility] [place] and [place] Number:  Herbalist and Address:  [facility] [place] [place] [address] [place], [place] [address]      Provider Number: [id number]  Attending Physician Name and Address:  [place], [place] Name and Phone Number:  Geannie Risen, [phone]    Current Level of Care: Hospital Recommended Level of Care: [place] Prior Approval Number:    Date Approved/Denied:   PASRR Number: [id number]  Discharge Plan: SNF    Current Diagnoses: Patient Active Problem List   Diagnosis Date Noted  . PAD (peripheral artery disease) ([place]) 08/22/2016  . Osteomyelitis of foot ([place]) 07/08/2016  . S/P transmetatarsal amputation of foot, right ([place]) 06/26/2016  . Constipation due to opioid therapy 06/15/2016  . Physical deconditioning 06/10/2016  . Wet gangrene ([place]) 06/10/2016  . Chronic diastolic CHF (congestive heart failure) ([place]) 06/10/2016  . Ischemic pain of right foot 06/04/2016  . Ischemia of foot 05/31/2016  . 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 Dyslipidemia associated with type 2 diabetes mellitus ([place]) 01/21/2016  . Uncontrolled type II diabetes with peripheral autonomic neuropathy ([place]) 01/17/2016  . Weight gain 12/20/2015  . Edema of right lower extremity 12/20/2015  . Cough 11/26/2015  . GERD (gastroesophageal reflux disease) 10/08/2015  . Amputation of left lower extremity below knee ([place]) 08/21/2015  . Phantom limb pain ([place])   . Anemia of chronic disease   . BPH (benign prostatic hyperplasia)   . ETOH abuse   . PVD (peripheral vascular disease) ([place])   . Chronic hepatitis C without hepatic coma ([place])   . History of CVA (cerebrovascular accident) without  residual deficits   . Hyponatremia   . Chronic kidney disease, stage 3 07/18/2015  . Essential hypertension 07/18/2015  . S/P lumbar spinal fusion 11/25/2013  . Melena 12/03/2011  . Hemiparesthesia 02/03/2011  . Polysubstance abuse 02/03/2011  . Tobacco abuse 02/03/2011    Orientation RESPIRATION BLADDER Height & Weight     Self, Time, Situation, Place  Normal Continent Weight: 212 lb 6.4 oz (96.3 kg) Height:  6' (182.9 cm)  BEHAVIORAL SYMPTOMS/MOOD NEUROLOGICAL BOWEL NUTRITION STATUS      Continent Diet (carb modified fluid consistency)  AMBULATORY STATUS COMMUNICATION OF NEEDS Skin   Limited Assist Verbally Normal                       Personal Care Assistance Level of Assistance  Bathing, Feeding, Dressing   Feeding assistance: Limited assistance Dressing Assistance: Independent     Functional Limitations Info  Sight, Hearing, Speech Sight Info: Adequate Hearing Info: Adequate Speech Info: Adequate    SPECIAL CARE FACTORS FREQUENCY  OT (By licensed OT), PT (By licensed PT)     PT Frequency: 5x wk OT Frequency: 5x wk            Contractures Contractures Info: Not present    Additional Factors Info  Code Status Code Status Info: full code             Current Medications (08/26/2016):  This is the current hospital active medication list Current Facility-Administered Medications  Medication Dose Route Frequency Provider Last Rate Last Dose  . 0.9 %  sodium chloride infusion   Intravenous Continuous  Ulyses Amor, PA-C   Stopped at 08/25/16 1951  . acetaminophen (TYLENOL) tablet 325-650 mg  325-650 mg Oral Q4H PRN Laurence Slate M, PA-C       Or  . acetaminophen (TYLENOL) suppository 325-650 mg  325-650 mg Rectal Q4H PRN Ulyses Amor, PA-C      . allopurinol (ZYLOPRIM) tablet 200 mg  200 mg Oral QPM Laurence Slate M, PA-C   200 mg at 08/26/16 1731  . alum & mag hydroxide-simeth (MAALOX/MYLANTA) 200-200-20 MG/5ML suspension 15-30 mL  15-30 mL Oral  Q2H PRN Laurence Slate M, PA-C      . aspirin EC tablet 81 mg  81 mg Oral Daily Laurence Slate M, PA-C   81 mg at 08/26/16 3614  . bisacodyl (DULCOLAX) EC tablet 5 mg  5 mg Oral Daily PRN Ulyses Amor, PA-C   5 mg at 08/25/16 4315  . carvedilol (COREG) tablet 3.125 mg  3.125 mg Oral BID WC Laurence Slate M, PA-C   3.125 mg at 08/26/16 1730  . clopidogrel (PLAVIX) tablet 75 mg  75 mg Oral Q breakfast Laurence Slate M, PA-C   75 mg at 08/26/16 4008  . docusate sodium (COLACE) capsule 100 mg  100 mg Oral Daily Laurence Slate M, PA-C   100 mg at 08/26/16 6761  . feeding supplement (PRO-STAT SUGAR FREE 64) liquid 30 mL  30 mL Oral BID Laurence Slate M, PA-C   30 mL at 08/26/16 0836  . ferrous sulfate tablet 325 mg  325 mg Oral BID Ulyses Amor, PA-C   325 mg at 08/26/16 9509  . gabapentin (NEURONTIN) tablet 600 mg  600 mg Oral TID Ulyses Amor, PA-C   600 mg at 08/26/16 1553  . guaiFENesin-dextromethorphan (ROBITUSSIN DM) 100-10 MG/5ML syrup 15 mL  15 mL Oral Q4H PRN Laurence Slate M, PA-C      . heparin injection 5,000 Units  5,000 Units Subcutaneous Q8H Laurence Slate M, PA-C   5,000 Units at 08/26/16 1553  . hydrALAZINE (APRESOLINE) injection 5 mg  5 mg Intravenous Q20 Min PRN Laurence Slate M, PA-C      . HYDROmorphone (DILAUDID) injection 2 mg  2 mg Intravenous Q2H PRN [name] A, PA-C      . insulin aspart (novoLOG) injection 0-9 Units  0-9 Units Subcutaneous TID WC Ulyses Amor, PA-C   2 Units at 08/26/16 1730  . insulin aspart (novoLOG) injection 5 Units  5 Units Subcutaneous TID PC CollinsSusette Racer, PA-C   5 Units at 08/26/16 1731  . insulin glargine (LANTUS) injection 25 Units  25 Units Subcutaneous QHS Ulyses Amor, [place]   25 Units at 08/25/16 2219  . labetalol (NORMODYNE,TRANDATE) injection 10 mg  10 mg Intravenous Q10 min PRN Laurence Slate M, PA-C      . lisinopril (PRINIVIL,ZESTRIL) tablet 5 mg  5 mg Oral Daily Laurence Slate M, PA-C   5 mg at 08/26/16 3267  . magnesium  hydroxide (MILK OF MAGNESIA) suspension 30 mL  30 mL Oral Daily PRN Ulyses Amor, PA-C   30 mL at 08/25/16 1206  . magnesium sulfate IVPB 2 g 50 mL  2 g Intravenous Daily PRN Laurence Slate M, PA-C      . metFORMIN (GLUCOPHAGE) tablet 500 mg  500 mg Oral Q breakfast Laurence Slate M, PA-C   500 mg at 08/26/16 1245  . metoprolol tartrate (LOPRESSOR) injection 2-5 mg  2-5 mg Intravenous Q2H PRN Ulyses Amor, PA-C      .  morphine (MS CONTIN) 12 hr tablet 15 mg  15 mg Oral Q12H Laurence Slate M, PA-C   15 mg at 08/26/16 1173  . ondansetron (ZOFRAN) injection 4 mg  4 mg Intravenous Q6H PRN Laurence Slate M, PA-C      . oxyCODONE (Oxy IR/ROXICODONE) immediate release tablet 10-15 mg  10-15 mg Oral Q4H PRN Waynetta Sandy, MD   15 mg at 08/26/16 1730  . pantoprazole (PROTONIX) EC tablet 40 mg  40 mg Oral Daily Laurence Slate M, PA-C   40 mg at 08/26/16 5670  . phenol (CHLORASEPTIC) mouth spray 1 spray  1 spray Mouth/Throat PRN Laurence Slate M, PA-C      . polyethylene glycol (MIRALAX / GLYCOLAX) packet 17 g  17 g Oral BID Laurence Slate M, PA-C   17 g at 08/26/16 1410  . potassium chloride SA (K-DUR,KLOR-CON) CR tablet 20-40 mEq  20-40 mEq Oral Daily PRN Laurence Slate M, PA-C      . senna-docusate (Senokot-S) tablet 1 tablet  1 tablet Oral BID Ulyses Amor, PA-C   1 tablet at 08/26/16 [phone]  . simvastatin (ZOCOR) tablet 20 mg  20 mg Oral q1800 Ulyses Amor, PA-C   20 mg at 08/26/16 1731  . tamsulosin (FLOMAX) capsule 0.4 mg  0.4 mg Oral Daily Laurence Slate M, PA-C   0.4 mg at 08/26/16 1438  . torsemide (DEMADEX) tablet 20 mg  20 mg Oral Daily Laurence Slate M, PA-C   20 mg at 08/26/16 8875     Discharge Medications: Please see discharge summary for a list of discharge medications.  Relevant Imaging Results:  Relevant Lab Results:   Additional Information [id number]  Wende Neighbors, LCSW

## 2016-08-26 NOTE — Progress Notes (Signed)
Case reviewed for LOS; B Erina Hamme RN,MHA,BSN [phone] 

## 2016-08-26 NOTE — Progress Notes (Signed)
  Progress Note    08/26/2016 7:12 AM 4 Days Post-Op  Subjective:  No complaints other that he is worried his bowels haven't moved  Afebrile HR  80's-100's NSR 354'S-568'L systolic 27% RA  Vitals:   08/25/16 2019 08/26/16 0446  BP: (!) 144/89 (!) 144/74  Pulse: 87   Resp: 20 16  Temp: 98.3 F (36.8 C) 98 F (36.7 C)    Physical Exam: Incisions:  Clean and dry with staples in tact.   CBC    Component Value Date/Time   WBC 10.1 08/26/2016 0200   RBC 3.02 (L) 08/26/2016 0200   HGB 8.0 (L) 08/26/2016 0200   HCT 26.6 (L) 08/26/2016 0200   HCT 26.3 (L) 11/26/2015 1440   HCT 32 10/31/2011 1319   PLT 423 (H) 08/26/2016 0200   MCV 88.1 08/26/2016 0200   MCV 82.8 10/31/2011 1319   MCH 26.5 08/26/2016 0200   MCHC 30.1 08/26/2016 0200   RDW 14.7 08/26/2016 0200   LYMPHSABS 1.9 07/18/2016 0431   MONOABS 1.2 (H) 07/18/2016 0431   EOSABS 0.1 07/18/2016 0431   BASOSABS 0.0 07/18/2016 0431    BMET    Component Value Date/Time   NA 132 (L) 08/25/2016 0249   NA 134 (A) 12/27/2015   K 3.8 08/25/2016 0249   K 4.0 10/31/2011 1319   CL 100 (L) 08/25/2016 0249   CO2 27 08/25/2016 0249   GLUCOSE 128 (H) 08/25/2016 0249   BUN 17 08/25/2016 0249   BUN 16 12/27/2015   CREATININE 0.97 08/25/2016 0249   CREATININE 1.06 12/22/2015 1551   CALCIUM 8.3 (L) 08/25/2016 0249   CALCIUM 9.0 10/31/2011 1319   GFRNONAA >60 08/25/2016 0249   GFRAA >60 08/25/2016 0249    INR    Component Value Date/Time   INR 1.12 06/10/2016 1316     Intake/Output Summary (Last 24 hours) at 08/26/16 5170 Last data filed at 08/26/16 0525  Gross per 24 hour  Intake             1900 ml  Output             2950 ml  Net            -1050 ml     Assessment/Plan:  66 y.o. male is s/p right below knee amputation  4 Days Post-Op  -stump is viable -pt has not had a BM-he got MOM and stool softeners.  Renal function okay-will order Mag Citrate. -for SNF most likely tomorrow per pt -continue  plavix/aspirin   Leontine Locket, PA-C [facility] [phone] 08/26/2016 7:12 AM

## 2016-08-27 LAB — GLUCOSE, CAPILLARY
GLUCOSE-CAPILLARY: 181 mg/dL — AB (ref 65–99)
GLUCOSE-CAPILLARY: 249 mg/dL — AB (ref 65–99)

## 2016-08-27 MED ORDER — OXYCODONE HCL 10 MG PO TABS: 10.0000 mg | ORAL_TABLET | ORAL | 0 refills | Status: DC | PRN

## 2016-08-27 NOTE — Care Management Note (Signed)
Case Management Note Marvetta Gibbons RN, BSN Unit 4E-Case Manager [phone]  Patient Details  Name: Cory Weber MRN: [id number] Date of Birth: 1950-04-21  Subjective/Objective:   Pt admitted s/p right BKA on 08/22/16                 Action/Plan: PTA pt lived at [facility]- plan to return to SNF- CSW following for placements and return to [facility]- pt stable for d/c on 08/27/16  Expected Discharge Date:  08/27/16               Expected Discharge Plan:  [place]  In-House Referral:  Clinical Social Work  Discharge planning Services  CM Consult  Post Acute Care Choice:  NA Choice offered to:  NA  DME Arranged:  N/A DME Agency:  NA  HH Arranged:  NA HH Agency:  NA  Status of Service:  Completed, signed off  If discussed at [employer] of Stay Meetings, dates discussed:    Discharge Disposition: skilled facility   Additional Comments:  Dawayne Patricia, RN 08/27/2016, 11:23 AM

## 2016-08-27 NOTE — Progress Notes (Signed)
Clinical Social Worker met patient at bedside to offer support and discuss patients disposition plan. Patient stated he is from [facility] and is in their long term facility section. CSW contacted Admissions Coordinator Jackelyn Poling) at facility to verify. Jackelyn Poling stated they are able to take patient back once e is ready. CSW will assist patient with transfer back to facility.  Rhea Pink, MSW,  [place]

## 2016-08-27 NOTE — Progress Notes (Addendum)
  Progress Note    08/27/2016 7:16 AM 5 Days Post-Op  Subjective:  Sleeping and wakes easily  Afebrile HR  80's-110's NSR 336'P-224'S systolic 97% RA  Vitals:   08/26/16 1946 08/27/16 0506  BP:    Pulse:    Resp:    Temp: 98.3 F (36.8 C) 98.4 F (36.9 C)    Physical Exam: Incisions:  Clean and dry with staples in tact Extremities:  Unable to straighten knee  CBC    Component Value Date/Time   WBC 10.1 08/26/2016 0200   RBC 3.02 (L) 08/26/2016 0200   HGB 8.0 (L) 08/26/2016 0200   HCT 26.6 (L) 08/26/2016 0200   HCT 26.3 (L) 11/26/2015 1440   HCT 32 10/31/2011 1319   PLT 423 (H) 08/26/2016 0200   MCV 88.1 08/26/2016 0200   MCV 82.8 10/31/2011 1319   MCH 26.5 08/26/2016 0200   MCHC 30.1 08/26/2016 0200   RDW 14.7 08/26/2016 0200   LYMPHSABS 1.9 07/18/2016 0431   MONOABS 1.2 (H) 07/18/2016 0431   EOSABS 0.1 07/18/2016 0431   BASOSABS 0.0 07/18/2016 0431    BMET    Component Value Date/Time   NA 132 (L) 08/25/2016 0249   NA 134 (A) 12/27/2015   K 3.8 08/25/2016 0249   K 4.0 10/31/2011 1319   CL 100 (L) 08/25/2016 0249   CO2 27 08/25/2016 0249   GLUCOSE 128 (H) 08/25/2016 0249   BUN 17 08/25/2016 0249   BUN 16 12/27/2015   CREATININE 0.97 08/25/2016 0249   CREATININE 1.06 12/22/2015 1551   CALCIUM 8.3 (L) 08/25/2016 0249   CALCIUM 9.0 10/31/2011 1319   GFRNONAA >60 08/25/2016 0249   GFRAA >60 08/25/2016 0249    INR    Component Value Date/Time   INR 1.12 06/10/2016 1316     Intake/Output Summary (Last 24 hours) at 08/27/16 0716 Last data filed at 08/26/16 1729  Gross per 24 hour  Intake              720 ml  Output             1000 ml  Net             -280 ml     Assessment/Plan:  66 y.o. male is s/p right below knee amputation  5 Days Post-Op  -stump is viable -needs to work on straightening knee with PT -discharge to SNF today -f/u with Dr. Donzetta Matters in 4 weeks   Leontine Locket, PA-C [facility] [phone] 08/27/2016 7:16 AM    I have independently interviewed patient and agree with PA assessment and plan above.   Brandon C. Donzetta Matters, MD [facility] [place] Office: [phone] Pager: [phone]

## 2016-08-27 NOTE — Discharge Summary (Signed)
Discharge Summary    Cory Weber 1950-10-30 65 y.o. male  [id number]  Admission Date: 08/22/2016  Discharge Date: 08/27/16  Physician: Thomes Lolling*  Admission Diagnosis: PERIPHERAL VASCULAR DISEASE  I70.92 NONHEALING SURGICAL WOUND RIGHT FOOT T81.89XD   HPI:   This is a 66 y.o. male status post proximal right foot amputation. He presents today with drainage and Steri-Strips that are falling off and staples as well. He is not having any fevers has been off of the foot since the amputation. He is relatively discouraged by his progression.  Hospital Course:  The patient was admitted to the hospital and taken to the operating room on 08/22/2016 and underwent: Right below knee amputation.   Intra-operative findings:   Findings: There was adequate bleeding tissue below the knee. All muscle was viable and anterior and posterior flaps approximated well.  The pt tolerated the procedure well and was transported to the PACU in good condition.   On POD 1, he had acute surgical blood loss anemia and was transfused 2 units PRBC's.   On POD 2, he had a good response to the transfusion.   On POD 3, he was afebrile, CXR negative and u/a was negative.    On POD 4, he was doing well.    On POD 5, he was doing well and discharged to the SNF.  He is unable to straighten his knee and will need to work with PT on this to help prevent contracture.    The remainder of the hospital course consisted of increasing mobilization and increasing intake of solids without difficulty.  CBC    Component Value Date/Time   WBC 10.1 08/26/2016 0200   RBC 3.02 (L) 08/26/2016 0200   HGB 8.0 (L) 08/26/2016 0200   HCT 26.6 (L) 08/26/2016 0200   HCT 26.3 (L) 11/26/2015 1440   HCT 32 10/31/2011 1319   PLT 423 (H) 08/26/2016 0200   MCV 88.1 08/26/2016 0200   MCV 82.8 10/31/2011 1319   MCH 26.5 08/26/2016 0200   MCHC 30.1 08/26/2016 0200   RDW 14.7 08/26/2016 0200   LYMPHSABS 1.9  07/18/2016 0431   MONOABS 1.2 (H) 07/18/2016 0431   EOSABS 0.1 07/18/2016 0431   BASOSABS 0.0 07/18/2016 0431    BMET    Component Value Date/Time   NA 132 (L) 08/25/2016 0249   NA 134 (A) 12/27/2015   K 3.8 08/25/2016 0249   K 4.0 10/31/2011 1319   CL 100 (L) 08/25/2016 0249   CO2 27 08/25/2016 0249   GLUCOSE 128 (H) 08/25/2016 0249   BUN 17 08/25/2016 0249   BUN 16 12/27/2015   CREATININE 0.97 08/25/2016 0249   CREATININE 1.06 12/22/2015 1551   CALCIUM 8.3 (L) 08/25/2016 0249   CALCIUM 9.0 10/31/2011 1319   GFRNONAA >60 08/25/2016 0249   GFRAA >60 08/25/2016 0249      Discharge Instructions    Call MD for:  redness, tenderness, or signs of infection (pain, swelling, bleeding, redness, odor or green/yellow discharge around incision site)    Complete by:  As directed    Call MD for:  severe or increased pain, loss or decreased feeling  in affected limb(s)    Complete by:  As directed    Call MD for:  temperature >100.5    Complete by:  As directed    Discharge instructions    Complete by:  As directed    Resume previous diet    Complete by:  As directed  Discharge Diagnosis:  PERIPHERAL VASCULAR DISEASE  I70.92 NONHEALING SURGICAL WOUND RIGHT FOOT T81.89XD  Secondary Diagnosis: Patient Active Problem List   Diagnosis Date Noted  . PAD (peripheral artery disease) ([place]) 08/22/2016  . Osteomyelitis of foot ([place]) 07/08/2016  . S/P transmetatarsal amputation of foot, right ([place]) 06/26/2016  . Constipation due to opioid therapy 06/15/2016  . Physical deconditioning 06/10/2016  . Wet gangrene ([place]) 06/10/2016  . Chronic diastolic CHF (congestive heart failure) ([place]) 06/10/2016  . Ischemic pain of right foot 06/04/2016  . Ischemia of foot 05/31/2016  . 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 Dyslipidemia associated with type 2 diabetes mellitus ([place]) 01/21/2016  . Uncontrolled type II diabetes with  peripheral autonomic neuropathy ([place]) 01/17/2016  . Weight gain 12/20/2015  . Edema of right lower extremity 12/20/2015  . Cough 11/26/2015  . GERD (gastroesophageal reflux disease) 10/08/2015  . Amputation of left lower extremity below knee ([place]) 08/21/2015  . Phantom limb pain ([place])   . Anemia of chronic disease   . BPH (benign prostatic hyperplasia)   . ETOH abuse   . PVD (peripheral vascular disease) ([place])   . Chronic hepatitis C without hepatic coma ([place])   . History of CVA (cerebrovascular accident) without residual deficits   . Hyponatremia   . Chronic kidney disease, stage 3 07/18/2015  . Essential hypertension 07/18/2015  . S/P lumbar spinal fusion 11/25/2013  . Melena 12/03/2011  . Hemiparesthesia 02/03/2011  . Polysubstance abuse 02/03/2011  . Tobacco abuse 02/03/2011   Past Medical History:  Diagnosis Date  . Anemia   . Asthma   . BPH (benign prostatic hyperplasia)   . Cellulitis and abscess of foot 07/2015  . Chronic diastolic heart failure ([place])    per Nursing facility  . Chronic kidney disease    CKD stage 3  . CVA (cerebral vascular accident) ([place]) 2015   denies residual on 08/15/2015  . Hepatitis C    states he was diagnosed in 2007 or 2007 while living in [place], [place]  . Hyperlipidemia   . Hypertension   . Ischemia of foot 05/31/2016  . Osteomyelitis ([place])    per Nursing Facility  . Peripheral neuropathy   . Pneumonia X 1  . PVD (peripheral vascular disease) ([place])   . Type II diabetes mellitus ([place])   . Wet gangrene ([place]) 06/10/2016     Allergies as of 08/27/2016      Reactions   No Known Allergies       Medication List    TAKE these medications   acetaminophen 325 MG tablet Commonly known as:  TYLENOL Take 650 mg by mouth every 4 (four) hours as needed for fever. For temp greater than 100.4   allopurinol 100 MG tablet Commonly known as:  ZYLOPRIM Take 200 mg by mouth every evening.   aspirin EC 81 MG tablet Take 81 mg by mouth daily.    carvedilol 3.125 MG tablet Commonly known as:  COREG Take 1 tablet (3.125 mg total) by mouth 2 (two) times daily with a meal.   clopidogrel 75 MG tablet Commonly known as:  PLAVIX Take 1 tablet (75 mg total) by mouth daily with breakfast.   feeding supplement (PRO-STAT SUGAR FREE 64) Liqd Take 30 mLs by mouth 2 (two) times daily.   ferrous sulfate 325 (65 FE) MG tablet Take 325 mg by mouth 2 (two) times daily.   gabapentin 600 MG tablet Commonly known as:  NEURONTIN  Take 1 tablet (600 mg total) by mouth 3 (three) times daily.   insulin aspart 100 UNIT/ML injection Commonly known as:  novoLOG Inject 5 Units into the skin 3 (three) times daily after meals. What changed:  how much to take  when to take this  additional instructions   insulin glargine 100 UNIT/ML injection Commonly known as:  LANTUS Inject 0.25 mLs (25 Units total) into the skin at bedtime.   lisinopril 5 MG tablet Commonly known as:  PRINIVIL,ZESTRIL Take 5 mg by mouth daily.   magnesium hydroxide 400 MG/5ML suspension Commonly known as:  MILK OF MAGNESIA Take 30 mLs by mouth daily as needed for mild constipation.   metFORMIN 500 MG tablet Commonly known as:  GLUCOPHAGE Take 500 mg by mouth daily.   morphine 15 MG 12 hr tablet Commonly known as:  MS CONTIN Take 1 tablet (15 mg total) by mouth every 12 (twelve) hours.   Oxycodone HCl 10 MG Tabs Take 1 tablet (10 mg total) by mouth every 4 (four) hours as needed (pain).   polyethylene glycol packet Commonly known as:  MIRALAX / GLYCOLAX Take 17 g by mouth 2 (two) times daily. What changed:  when to take this  reasons to take this   Probiotic Caps Take 1 capsule by mouth 2 (two) times daily.   senna-docusate 8.6-50 MG tablet Commonly known as:  Senokot-S Take 1 tablet by mouth 2 (two) times daily. What changed:  additional instructions   simvastatin 20 MG tablet Commonly known as:  ZOCOR Take 20 mg by mouth daily.   tamsulosin 0.4  MG Caps capsule Commonly known as:  FLOMAX Take 1 capsule (0.4 mg total) by mouth daily.   torsemide 20 MG tablet Commonly known as:  DEMADEX Take 20 mg by mouth daily.   vitamin C 500 MG tablet Commonly known as:  ASCORBIC ACID Take 500 mg by mouth 2 (two) times daily.   Vitamin D (Ergocalciferol) 50000 units Caps capsule Commonly known as:  DRISDOL Take 50,000 Units by mouth every Friday. Takes every Friday       Prescriptions given: Roxicodone #30 No Refill  Instructions: 1.  Shower daily starting 08/27/16 2.  Needs PT to help with straightening right knee to help prevent contracture as well as increasing strength and endurance.  PT note 08/26/16: Mobility  Bed Mobility Overal bed mobility: Needs Assistance Bed Mobility: Sit to Supine Sit to supine: Supervision;HOB elevated General bed mobility comments: supervision, vc for scooting hips to square in the bed before laying back  Transfers Overall transfer level: Needs assistance Equipment used: None Transfers: Government social research officer transfers: Min assist General transfer comment: minA for pad scoot to aid hips back into recliner       Balance Overall balance assessment: Needs assistance Sitting-balance support: Feet unsupported;No upper extremity supported Sitting balance-Leahy Scale: Good Sitting balance - Comments: able to reach outside his BoS     Cognition Arousal/Alertness: Awake/alert Behavior During Therapy: WFL for tasks assessed/performed Overall Cognitive Status: Within Functional Limits for tasks assessed    Exercises Amputee Exercises Quad Sets: AROM;Left;10 reps;Seated Gluteal Sets: AROM;Left;10 reps;Seated Hip ABduction/ADduction: AROM;Left;10 reps;Seated Hip Flexion/Marching: AROM;Left;10 reps;Seated Knee Flexion: AROM;Left;10 reps;Seated Knee Extension: AROM;Left;10 reps;Seated Straight Leg Raises: AROM;Both;10 reps;Seated    General Comments        Pertinent Vitals/Pain Pain Assessment: 0-10 Pain Score: 3  Pain Location: R LE surgical site Pain Descriptors / Indicators: Grimacing;Guarding Pain Intervention(s): Monitored during session  VSS  PT Goals (current goals can now be found in the care plan section) Acute Rehab PT Goals Patient Stated Goal: get back to rehab PT Goal Formulation: With patient Time For Goal Achievement: 08/30/16 Potential to Achieve Goals: Good Progress towards PT goals: Progressing toward goals    Frequency    Min 2X/week      PT Plan Current plan remains appropriate       AM-PAC PT "6 Clicks" Daily Activity  Outcome Measure  Difficulty turning over in bed (including adjusting bedclothes, sheets and blankets)?: A Little Difficulty moving from lying on back to sitting on the side of the bed? : A Little Difficulty sitting down on and standing up from a chair with arms (e.g., wheelchair, bedside commode, etc,.)?: Total Help needed moving to and from a bed to chair (including a wheelchair)?: A Little Help needed walking in hospital room?: Total Help needed climbing 3-5 steps with a railing? : Total 6 Click Score: 12    End of Session Activity Tolerance: Patient tolerated treatment well Patient left: in chair;with call bell/phone within reach Nurse Communication: Mobility status;Patient requests pain meds PT Visit Diagnosis: Other abnormalities of gait and mobility (R26.89);Pain Pain - Right/Left: Right Pain - part of body: Leg    Disposition: SNF  Patient's condition: is Good  Follow up: 1. Dr. Donzetta Matters in 4 weeks   Leontine Locket, PA-C [facility] [phone] 08/27/2016  7:29 AM

## 2016-08-27 NOTE — Progress Notes (Signed)
Clinical Social Worker facilitated patient discharge including contacting patient family and facility to confirm patient discharge plans.  Clinical information faxed to facility and family agreeable with plan.  CSW arranged ambulance transport via [facility] to [facility] .  RN Erasmo Downer to call [phone] and ask for Lelon Frohlich (pt returning to rm A2) for report prior to discharge.  Clinical Social Worker will sign off for now as social work intervention is no longer needed. Please consult [place] again if new need arises.  Rhea Pink, MSW, [place]

## 2016-09-09 ENCOUNTER — Encounter: Payer: Medicaid Other | Attending: Physical Medicine & Rehabilitation | Admitting: Physical Medicine & Rehabilitation

## 2016-09-09 DIAGNOSIS — I739 Peripheral vascular disease, unspecified: Secondary | ICD-10-CM | POA: Insufficient documentation

## 2016-09-09 DIAGNOSIS — Z9889 Other specified postprocedural states: Secondary | ICD-10-CM | POA: Insufficient documentation

## 2016-09-09 DIAGNOSIS — I998 Other disorder of circulatory system: Secondary | ICD-10-CM | POA: Insufficient documentation

## 2016-09-09 DIAGNOSIS — D649 Anemia, unspecified: Secondary | ICD-10-CM | POA: Insufficient documentation

## 2016-09-09 DIAGNOSIS — Z87891 Personal history of nicotine dependence: Secondary | ICD-10-CM | POA: Insufficient documentation

## 2016-09-09 DIAGNOSIS — Z89512 Acquired absence of left leg below knee: Secondary | ICD-10-CM | POA: Insufficient documentation

## 2016-09-09 DIAGNOSIS — I129 Hypertensive chronic kidney disease with stage 1 through stage 4 chronic kidney disease, or unspecified chronic kidney disease: Secondary | ICD-10-CM | POA: Insufficient documentation

## 2016-09-09 DIAGNOSIS — E1122 Type 2 diabetes mellitus with diabetic chronic kidney disease: Secondary | ICD-10-CM | POA: Insufficient documentation

## 2016-09-09 DIAGNOSIS — N183 Chronic kidney disease, stage 3 (moderate): Secondary | ICD-10-CM | POA: Insufficient documentation

## 2016-09-27 ENCOUNTER — Ambulatory Visit (INDEPENDENT_AMBULATORY_CARE_PROVIDER_SITE_OTHER): Payer: Self-pay | Admitting: Family

## 2016-09-27 ENCOUNTER — Encounter: Payer: Self-pay | Admitting: Family

## 2016-09-27 VITALS — BP 105/71 | HR 89 | Temp 97.8°F | Resp 18 | Wt 208.0 lb

## 2016-09-27 DIAGNOSIS — R609 Edema, unspecified: Secondary | ICD-10-CM

## 2016-09-27 DIAGNOSIS — I779 Disorder of arteries and arterioles, unspecified: Secondary | ICD-10-CM

## 2016-09-27 DIAGNOSIS — Z89511 Acquired absence of right leg below knee: Secondary | ICD-10-CM

## 2016-09-27 DIAGNOSIS — Z89512 Acquired absence of left leg below knee: Secondary | ICD-10-CM

## 2016-09-27 DIAGNOSIS — Z4802 Encounter for removal of sutures: Secondary | ICD-10-CM

## 2016-09-27 NOTE — Progress Notes (Signed)
Postoperative Visit   History of Present Illness  Cory Weber is a 66 y.o. male who is s/p right below knee amputation on 08-22-16 by Dr. Donzetta Matters for necrotic wound of right Lisfranc amputation. The pt has a history of a left-sided below-knee amputation had a right sided Lisfranc amputation that has failed to heal.  He returns today for staples removal and wound check.  He denies fever or chills. He denies pain in the right BKA stump other than when palpated.   He was using a prosthesis on his left BKA and plans on using a prosthesis on his right BKA once it heals.  He states he is connected with a prosthetic company that is not Museum/gallery curator, is not [employer].  He is residing in [place] at [facility], states he may [place] another facility [place]".   He has dependent edema in the right BKA stump and several areas of staple abscess at the staples insertion sites.   For VQI Use Only  PRE-ADM LIVING: Nursing home  AMB STATUS: Wheelchair   Past Medical History:  Diagnosis Date  . Anemia   . Asthma   . BPH (benign prostatic hyperplasia)   . Cellulitis and abscess of foot 07/2015  . Chronic diastolic heart failure ([place])    per Nursing facility  . Chronic kidney disease    CKD stage 3  . CVA (cerebral vascular accident) ([place]) 2015   denies residual on 08/15/2015  . Hepatitis C    states he was diagnosed in 2007 or 2007 while living in [place], [place]  . Hyperlipidemia   . Hypertension   . Ischemia of foot 05/31/2016  . Osteomyelitis ([place])    per Nursing Facility  . Peripheral neuropathy   . Pneumonia X 1  . PVD (peripheral vascular disease) ([place])   . Type II diabetes mellitus ([place])   . Wet gangrene ([place]) 06/10/2016    Past Surgical History:  Procedure Laterality Date  . ABDOMINAL AORTOGRAM N/A 06/03/2016   Procedure: Abdominal Aortogram;  Surgeon: Cory Sandy, MD;  Location: [facility];  Service: Cardiovascular;  Laterality: N/A;  .  AMPUTATION Left 08/16/2015   Procedure: LEFT BELOW THE KNEE AMPUTATION ;  Surgeon: Cory Mitchell, MD;  Location: [facility];  Service: Vascular;  Laterality: Left;  . AMPUTATION Right 08/22/2016   Procedure: AMPUTATION BELOW KNEE-RIGHT;  Surgeon: Cory Sandy, MD;  Location: [facility];  Service: Vascular;  Laterality: Right;  . BACK SURGERY    . BELOW KNEE LEG AMPUTATION Left 08/16/2015  . BIOPSY N/A 09/03/2012   Procedure: BIOPSY;  Surgeon: Daneil Dolin, MD;  Location: [facility];  Service: Endoscopy;  Laterality: N/A;  gastric and gastric mucosa  . BIOPSY N/A 12/03/2012   Procedure: BIOPSY;  Surgeon: Daneil Dolin, MD;  Location: [facility];  Service: Endoscopy;  Laterality: N/A;  . COLONOSCOPY WITH PROPOFOL N/A 09/03/2012   YWV:[id number] polyp-removed as outlined above. Prominent internal hemorrhoids. Tubular adenoma  . ESOPHAGOGASTRODUODENOSCOPY (EGD) WITH PROPOFOL N/A 09/03/2012   RSW:NIOEVO hernia. Gastric diverticulum. Gastric ulcers with associated erosions. Duodenal erosions. Status post gastric biopsy. H.PYLORI gastritis   . ESOPHAGOGASTRODUODENOSCOPY (EGD) WITH PROPOFOL N/A 12/03/2012   Dr. Gala Weber: gastric diverticulum, gastric erosions and scar. Previously noted gastric ulcer completed healed. Biopsy without H.pylori.   . ESOPHAGOGASTRODUODENOSCOPY (EGD) WITH PROPOFOL N/A 01/23/2016   Procedure: ESOPHAGOGASTRODUODENOSCOPY (EGD) WITH PROPOFOL;  Surgeon: Cory Stabler, MD;  Location: [facility];  Service: Gastroenterology;  Laterality: N/A;  . I&D EXTREMITY Right 07/11/2016   Procedure: DELAY PRIMARY CLOSURE FOOT AMPUTATION;  Surgeon: Cory Weber, DPM;  Location: [facility];  Service: Podiatry;  Laterality: Right;  . LIVER BIOPSY  2005   Done in [place], [place]. Chronic hepatitis with mild periportal inflammation, lobular unicellular necrosis and portal fibrosis. Grade 2, stage 1-2.  . LOWER EXTREMITY ANGIOGRAPHY Right 06/03/2016   Procedure: Lower Extremity Angiography;  Surgeon: Cory Sandy, MD;  Location: [facility];  Service: Cardiovascular;  Laterality: Right;  . MAXIMUM ACCESS (MAS)POSTERIOR LUMBAR INTERBODY FUSION (PLIF) 1 LEVEL N/A 11/25/2013   Procedure: FOR MAXIMUM ACCESS (MAS) POSTERIOR LUMBAR INTERBODY FUSION (PLIF) 1 LEVEL;  Surgeon: Cory Moore, MD;  Location: [facility];  Service: Neurosurgery;  Laterality: N/A;  FOR MAXIMUM ACCESS (MAS) POSTERIOR LUMBAR INTERBODY FUSION (PLIF) 1 LEVEL LUMBAR 3-4  . PERIPHERAL VASCULAR ATHERECTOMY Right 06/03/2016   Procedure: Peripheral Vascular Atherectomy;  Surgeon: Cory Sandy, MD;  Location: [facility];  Service: Cardiovascular;  Laterality: Right;  SFA WITH STENT  . PERIPHERAL VASCULAR CATHETERIZATION Left 08/01/2015   Procedure: Lower Extremity Angiography;  Surgeon: Cory Mitchell, MD;  Location: [facility];  Service: Cardiovascular;  Laterality: Left;  . PERIPHERAL VASCULAR CATHETERIZATION N/A 08/01/2015   Procedure: Abdominal Aortogram;  Surgeon: Cory Mitchell, MD;  Location: [facility];  Service: Cardiovascular;  Laterality: N/A;  . PERIPHERAL VASCULAR CATHETERIZATION N/A 08/08/2015   Procedure: Abdominal Aortogram w/Lower Extremity;  Surgeon: Cory Mitchell, MD;  Location: [facility];  Service: Cardiovascular;  Laterality: N/A;  . PERIPHERAL VASCULAR CATHETERIZATION Left 08/08/2015   Procedure: Peripheral Vascular Balloon Angioplasty;  Surgeon: Cory Mitchell, MD;  Location: [facility];  Service: Cardiovascular;  Laterality: Left;  left popiteal artery, left peronealtrunk, left post tibial  . POLYPECTOMY N/A 09/03/2012   Procedure: POLYPECTOMY;  Surgeon: Daneil Dolin, MD;  [facility];  Service: Endoscopy;  Laterality: N/A;  cecal polyp  . TONSILLECTOMY    . TRANSMETATARSAL AMPUTATION Right 06/13/2016   Procedure: RIGHT TRANSMETATARSAL AMPUTATION;  Surgeon: Cory Sandy, MD;  Location: [facility];  Service: Vascular;  Laterality: Right;   . TRANSMETATARSAL AMPUTATION Right 07/10/2016   Procedure: REVISION RIGHT TRANSMETATARSAL AMPUTATION;  Surgeon: Cory Sandy, MD;  Location: [facility];  Service: Vascular;  Laterality: Right;    Social History   Social History  . Marital status: Divorced    Spouse name: N/A  . Number of children: 1  . Years of education: N/A   Occupational History  . disabled    Social History Main Topics  . Smoking status: Former Smoker    Packs/day: 1.00    Years: 47.00    Types: Cigarettes    Quit date: 07/13/2015  . Smokeless tobacco: Never Used  . Alcohol use No     Comment:  none 08/15/2015 "stopped drinking 3-4 years ago"  . Drug use: No     Comment: 08/15/2015 "last used cocaine 3-4 months ago"  . Sexual activity: No   Other Topics Concern  . Not on file   Social History Narrative  . No narrative on file    Allergies  Allergen Reactions  . No Known Allergies     Current Outpatient Prescriptions on File Prior to Visit  Medication Sig Dispense Refill  . acetaminophen (TYLENOL) 325 MG tablet Take 650 mg by mouth every 4 (four) hours as needed for fever. For  temp greater than 100.4    . allopurinol (ZYLOPRIM) 100 MG tablet Take 200 mg by mouth every evening.    . Amino Acids-Protein Hydrolys (FEEDING SUPPLEMENT, PRO-STAT SUGAR FREE 64,) LIQD Take 30 mLs by mouth 2 (two) times daily. 900 mL 0  . aspirin EC 81 MG tablet Take 81 mg by mouth daily.    . carvedilol (COREG) 3.125 MG tablet Take 1 tablet (3.125 mg total) by mouth 2 (two) times daily with a meal. 60 tablet 0  . clopidogrel (PLAVIX) 75 MG tablet Take 1 tablet (75 mg total) by mouth daily with breakfast. 30 tablet 0  . ferrous sulfate 325 (65 FE) MG tablet Take 325 mg by mouth 2 (two) times daily.     Marland Kitchen gabapentin (NEURONTIN) 600 MG tablet Take 1 tablet (600 mg total) by mouth 3 (three) times daily.    . insulin aspart (NOVOLOG) 100 UNIT/ML injection Inject 5 Units into the skin 3 (three) times daily after meals.  (Patient taking differently: Inject 2-8 Units into the skin as directed. Inject 5 units 3 times daily after meals.  Inject 3 times daily before meals as per sliding scale: 150-200= 2 units, 201-250= 4 units, 251-300= 6 units, 301-350= 8 units, if >251 notify MD)    . insulin glargine (LANTUS) 100 UNIT/ML injection Inject 0.25 mLs (25 Units total) into the skin at bedtime.    Marland Kitchen lisinopril (PRINIVIL,ZESTRIL) 5 MG tablet Take 5 mg by mouth daily.     . magnesium hydroxide (MILK OF MAGNESIA) 400 MG/5ML suspension Take 30 mLs by mouth daily as needed for mild constipation.    . metFORMIN (GLUCOPHAGE) 500 MG tablet Take 500 mg by mouth daily.     Marland Kitchen morphine (MS CONTIN) 15 MG 12 hr tablet Take 1 tablet (15 mg total) by mouth every 12 (twelve) hours. 20 tablet 0  . Oxycodone HCl 10 MG TABS Take 1 tablet (10 mg total) by mouth every 4 (four) hours as needed (pain). 30 tablet 0  . polyethylene glycol (MIRALAX / GLYCOLAX) packet Take 17 g by mouth 2 (two) times daily. (Patient taking differently: Take 17 g by mouth every 12 (twelve) hours as needed for mild constipation. ) 14 each 0  . Probiotic CAPS Take 1 capsule by mouth 2 (two) times daily.    Marland Kitchen senna-docusate (SENOKOT-S) 8.6-50 MG tablet Take 1 tablet by mouth 2 (two) times daily. (Patient taking differently: Take 1 tablet by mouth 2 (two) times daily. Hold for loose stools)    . simvastatin (ZOCOR) 20 MG tablet Take 20 mg by mouth daily.     . tamsulosin (FLOMAX) 0.4 MG CAPS capsule Take 1 capsule (0.4 mg total) by mouth daily. 30 capsule 0  . torsemide (DEMADEX) 20 MG tablet Take 20 mg by mouth daily.     . vitamin C (ASCORBIC ACID) 500 MG tablet Take 500 mg by mouth 2 (two) times daily.    . Vitamin D, Ergocalciferol, (DRISDOL) 50000 units CAPS capsule Take 50,000 Units by mouth every Friday. Takes every Friday     . [DISCONTINUED] ferrous fumarate (HEMOCYTE - 106 MG FE) 325 (106 FE) MG TABS tablet Take 1 tablet (106 mg of iron total) by mouth 2 (two)  times daily. (Patient not taking: Reported on 04/01/2014) 60 each 0   No current facility-administered medications on file prior to visit.      Physical Examination  Vitals:   09/27/16 0850  BP: 105/71  Pulse: 89  Resp: 18  Temp: 97.8 F (36.6 C)  SpO2: 98%  Weight: 208 lb (94.3 kg)   Body mass index is 28.21 kg/m.   Dependent edema in the right BKA stump, several areas of abscess at the staples insertion sites (not infections, local irritations from staples). Incision edges are well proximated. There are several areas of weeping of red tinged watery drainage, mostly at the lateral edge of the incision.  Staples are intact.  Medical Decision Making  VERLYN LAMBERT is a 66 y.o. male who presents s/p right below knee amputation on 08-22-16 by Dr. Donzetta Matters for necrotic wound of right Lisfranc amputation. The pt has a history of a left-sided below-knee amputation. I discussed with Dr. Donzetta Matters pt HPI and physical exam results.  Every other staple removed. Absorbant and compression dressing applied to right BKA stump.  He has dependent edema in the right BKA stump, see Patient Instructions for elevation instructions.  Return in 2 weeks for wound check and removal or remaining staples.    NICKEL, Sharmon Leyden, RN, MSN, FNP-C [facility] Office: [phone]  09/27/2016, 9:07 AM  Clinic MD: Donzetta Matters

## 2016-09-27 NOTE — Patient Instructions (Addendum)
  To decrease swelling in your right BKA stump: Elevate stump above heart overnight and 3-4 times per day for 20 minutes.    Peripheral Vascular Disease Peripheral vascular disease (PVD) is a disease of the blood vessels that are not part of your heart and brain. A simple term for PVD is poor circulation. In most cases, PVD narrows the blood vessels that carry blood from your heart to the rest of your body. This can result in a decreased supply of blood to your arms, legs, and internal organs, like your stomach or kidneys. However, it most often affects a person's lower legs and feet. There are two types of PVD.  Organic PVD. This is the more common type. It is caused by damage to the structure of blood vessels.  Functional PVD. This is caused by conditions that make blood vessels contract and tighten (spasm).  Without treatment, PVD tends to get worse over time. PVD can also lead to acute ischemic limb. This is when an arm or limb suddenly has trouble getting enough blood. This is a medical emergency. Follow these instructions at home:  Take medicines only as told by your doctor.  Do not use any tobacco products, including cigarettes, chewing tobacco, or electronic cigarettes. If you need help quitting, ask your doctor.  Lose weight if you are overweight, and maintain a healthy weight as told by your doctor.  Eat a diet that is low in fat and cholesterol. If you need help, ask your doctor.  Exercise regularly. Ask your doctor for some good activities for you.  Take good care of your feet. ? Wear comfortable shoes that fit well. ? Check your feet often for any cuts or sores. Contact a doctor if:  You have cramps in your legs while walking.  You have leg pain when you are at rest.  You have coldness in a leg or foot.  Your skin changes.  You are unable to get or have an erection (erectile dysfunction).  You have cuts or sores on your feet that are not healing. Get help right  away if:  Your arm or leg turns cold and blue.  Your arms or legs become red, warm, swollen, painful, or numb.  You have chest pain or trouble breathing.  You suddenly have weakness in your face, arm, or leg.  You become very confused or you cannot speak.  You suddenly have a very bad headache.  You suddenly cannot see. This information is not intended to replace advice given to you by your health care provider. Make sure you discuss any questions you have with your health care provider. Document Released: 04/24/2009 Document Revised: 07/06/2015 Document Reviewed: 07/08/2013 Elsevier Interactive Patient Education  2017 [employer].

## 2016-10-05 ENCOUNTER — Emergency Department (HOSPITAL_COMMUNITY)
Admission: EM | Admit: 2016-10-05 | Discharge: 2016-10-05 | Disposition: A | Discharge status: Home / Self Care | Payer: Medicaid Other | Attending: Emergency Medicine | Admitting: Emergency Medicine

## 2016-10-05 ENCOUNTER — Encounter (HOSPITAL_COMMUNITY): Payer: Self-pay | Admitting: *Deleted

## 2016-10-05 DIAGNOSIS — N183 Chronic kidney disease, stage 3 (moderate): Secondary | ICD-10-CM | POA: Insufficient documentation

## 2016-10-05 DIAGNOSIS — I130 Hypertensive heart and chronic kidney disease with heart failure and stage 1 through stage 4 chronic kidney disease, or unspecified chronic kidney disease: Secondary | ICD-10-CM | POA: Diagnosis not present

## 2016-10-05 DIAGNOSIS — Z87891 Personal history of nicotine dependence: Secondary | ICD-10-CM | POA: Diagnosis not present

## 2016-10-05 DIAGNOSIS — E1122 Type 2 diabetes mellitus with diabetic chronic kidney disease: Secondary | ICD-10-CM | POA: Diagnosis not present

## 2016-10-05 DIAGNOSIS — Z7902 Long term (current) use of antithrombotics/antiplatelets: Secondary | ICD-10-CM | POA: Diagnosis not present

## 2016-10-05 DIAGNOSIS — R040 Epistaxis: Secondary | ICD-10-CM | POA: Insufficient documentation

## 2016-10-05 DIAGNOSIS — Z7982 Long term (current) use of aspirin: Secondary | ICD-10-CM | POA: Diagnosis not present

## 2016-10-05 DIAGNOSIS — J45909 Unspecified asthma, uncomplicated: Secondary | ICD-10-CM | POA: Diagnosis not present

## 2016-10-05 DIAGNOSIS — I5032 Chronic diastolic (congestive) heart failure: Secondary | ICD-10-CM | POA: Diagnosis not present

## 2016-10-05 DIAGNOSIS — Z794 Long term (current) use of insulin: Secondary | ICD-10-CM | POA: Diagnosis not present

## 2016-10-05 DIAGNOSIS — D500 Iron deficiency anemia secondary to blood loss (chronic): Secondary | ICD-10-CM | POA: Diagnosis not present

## 2016-10-05 LAB — CBC WITH DIFFERENTIAL/PLATELET
BASOS ABS: 0 10*3/uL (ref 0.0–0.1)
BASOS PCT: 0 %
EOS PCT: 3 %
Eosinophils Absolute: 0.2 10*3/uL (ref 0.0–0.7)
HCT: 21.5 % — ABNORMAL LOW (ref 39.0–52.0)
Hemoglobin: 6.8 g/dL — CL (ref 13.0–17.0)
Lymphocytes Relative: 26 %
Lymphs Abs: 2.1 10*3/uL (ref 0.7–4.0)
MCH: 28.3 pg (ref 26.0–34.0)
MCHC: 31.6 g/dL (ref 30.0–36.0)
MCV: 89.6 fL (ref 78.0–100.0)
MONO ABS: 0.6 10*3/uL (ref 0.1–1.0)
MONOS PCT: 8 %
Neutro Abs: 5 10*3/uL (ref 1.7–7.7)
Neutrophils Relative %: 63 %
PLATELETS: 362 10*3/uL (ref 150–400)
RBC: 2.4 MIL/uL — ABNORMAL LOW (ref 4.22–5.81)
RDW: 15.9 % — AB (ref 11.5–15.5)
WBC: 7.9 10*3/uL (ref 4.0–10.5)

## 2016-10-05 LAB — PREPARE RBC (CROSSMATCH)

## 2016-10-05 LAB — BASIC METABOLIC PANEL
ANION GAP: 7 (ref 5–15)
BUN: 24 mg/dL — ABNORMAL HIGH (ref 6–20)
CALCIUM: 9.2 mg/dL (ref 8.9–10.3)
CO2: 26 mmol/L (ref 22–32)
CREATININE: 1.08 mg/dL (ref 0.61–1.24)
Chloride: 100 mmol/L — ABNORMAL LOW (ref 101–111)
Glucose, Bld: 172 mg/dL — ABNORMAL HIGH (ref 65–99)
Potassium: 4.3 mmol/L (ref 3.5–5.1)
Sodium: 133 mmol/L — ABNORMAL LOW (ref 135–145)

## 2016-10-05 MED ORDER — SODIUM CHLORIDE 0.9 % IV SOLN
Freq: Once | INTRAVENOUS | Status: AC
  Administered 2016-10-05: 09:00:00 via INTRAVENOUS

## 2016-10-05 MED ORDER — OXYCODONE-ACETAMINOPHEN 5-325 MG PO TABS
2.0000 | ORAL_TABLET | Freq: Once | ORAL | Status: AC
  Administered 2016-10-05: 2 via ORAL
  Filled 2016-10-05: qty 2

## 2016-10-05 NOTE — ED Notes (Signed)
Meal provided 

## 2016-10-05 NOTE — ED Notes (Signed)
EDP at bedside updating patient. 

## 2016-10-05 NOTE — Discharge Instructions (Signed)
Do not take Plavix today or tomorrow. Use the Afrin nasal spray if your bleeding in your nose starts back again. After you put 2 squirts in each nostril then hold your nose closed for 20 minutes, if the bleeding will not stop after that 20 minutes then you need to come back to the hospital. Follow-up with your family doctor this week to have your blood count rechecked

## 2016-10-05 NOTE — ED Notes (Addendum)
CRITICAL VALUE ALERT  Critical Value:  Hgb 6.8  Date & Time Notied:  10/05/2016 0850  Provider Notified: Dr. Roderic Palau

## 2016-10-05 NOTE — ED Notes (Signed)
Phlebotomy at bedside.

## 2016-10-05 NOTE — ED Notes (Signed)
Call to EMS for transport to [facility]

## 2016-10-05 NOTE — ED Notes (Signed)
Pt signed blood consent. Pt has no questions or concerns at this time.

## 2016-10-05 NOTE — ED Triage Notes (Signed)
Pt reports nosebleed x 4 hrs. EMS gave pt Afrin en route which helped the bleeding.

## 2016-10-05 NOTE — ED Notes (Signed)
Transfusion completed

## 2016-10-05 NOTE — ED Notes (Signed)
Ate 90 per cent of meal

## 2016-10-05 NOTE — ED Notes (Addendum)
Pt called out stating his nose was bleeding again. Pt reports that when he blew his nose, he "blew the clot out." Pt instructed to not blow nose, but to wipe gently. Pt applying pressure at this time. Bleeding controlled.

## 2016-10-05 NOTE — ED Notes (Signed)
Pt given crackers with peanut butter and water per request. EDP approval to eat/drink.

## 2016-10-05 NOTE — ED Notes (Signed)
Eating meal currently

## 2016-10-05 NOTE — ED Provider Notes (Signed)
[facility] DEPT Provider Note   CSN: [id number] Arrival date & time: 10/05/16  1962     History   Chief Complaint Chief Complaint  Patient presents with  . Epistaxis    HPI Cory Weber is a 66 y.o. male.  Patient comes in with the right sided nosebleed. Patient has a history of peripheral vascular disease and is on Plavix   The history is provided by the patient.  Epistaxis   This is a recurrent problem. The current episode started 1 to 2 hours ago. The problem occurs constantly. The problem has not changed since onset.The problem is associated with anticoagulants. The bleeding has been from the right nare. He has tried nothing for the symptoms. The treatment provided no relief. His past medical history does not include sinus problems.    Past Medical History:  Diagnosis Date  . Anemia   . Asthma   . BPH (benign prostatic hyperplasia)   . Cellulitis and abscess of foot 07/2015  . Chronic diastolic heart failure ([place])    per Nursing facility  . Chronic kidney disease    CKD stage 3  . CVA (cerebral vascular accident) ([place]) 2015   denies residual on 08/15/2015  . Hepatitis C    states he was diagnosed in 2007 or 2007 while living in [place], [place]  . Hyperlipidemia   . Hypertension   . Ischemia of foot 05/31/2016  . Osteomyelitis ([place])    per Nursing Facility  . Peripheral neuropathy   . Pneumonia X 1  . PVD (peripheral vascular disease) ([place])   . Type II diabetes mellitus ([place])   . Wet gangrene ([place]) 06/10/2016    Patient Active Problem List   Diagnosis Date Noted  . PAD (peripheral artery disease) ([place]) 08/22/2016  . Osteomyelitis of foot ([place]) 07/08/2016  . S/P transmetatarsal amputation of foot, right ([place]) 06/26/2016  . Constipation due to opioid therapy 06/15/2016  . Physical deconditioning 06/10/2016  . Wet gangrene ([place]) 06/10/2016  . Chronic diastolic CHF (congestive heart failure) ([place]) 06/10/2016  . Ischemic pain of right foot 06/04/2016  .  Ischemia of foot 05/31/2016  . 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 Dyslipidemia associated with type 2 diabetes mellitus ([place]) 01/21/2016  . Uncontrolled type II diabetes with peripheral autonomic neuropathy ([place]) 01/17/2016  . Weight gain 12/20/2015  . Edema of right lower extremity 12/20/2015  . Cough 11/26/2015  . GERD (gastroesophageal reflux disease) 10/08/2015  . Amputation of left lower extremity below knee ([place]) 08/21/2015  . Phantom limb pain ([place])   . Anemia of chronic disease   . BPH (benign prostatic hyperplasia)   . ETOH abuse   . PVD (peripheral vascular disease) ([place])   . Chronic hepatitis C without hepatic coma ([place])   . History of CVA (cerebrovascular accident) without residual deficits   . Hyponatremia   . Chronic kidney disease, stage 3 07/18/2015  . Essential hypertension 07/18/2015  . S/P lumbar spinal fusion 11/25/2013  . Melena 12/03/2011  . Hemiparesthesia 02/03/2011  . Polysubstance abuse 02/03/2011  . Tobacco abuse 02/03/2011    Past Surgical History:  Procedure Laterality Date  . ABDOMINAL AORTOGRAM N/A 06/03/2016   Procedure: Abdominal Aortogram;  Surgeon: Waynetta Sandy, MD;  Location: [facility];  Service: Cardiovascular;  Laterality: N/A;  . AMPUTATION Left 08/16/2015   Procedure: LEFT BELOW THE KNEE AMPUTATION ;  Surgeon: Serafina Mitchell, MD;  Location: [facility];  Service: Vascular;  Laterality: Left;  . AMPUTATION Right 08/22/2016   Procedure: AMPUTATION BELOW KNEE-RIGHT;  Surgeon: Waynetta Sandy, MD;  Location: [facility];  Service: Vascular;  Laterality: Right;  . BACK SURGERY    . BELOW KNEE LEG AMPUTATION Left 08/16/2015  . BIOPSY N/A 09/03/2012   Procedure: BIOPSY;  Surgeon: Daneil Dolin, MD;  Location: [facility];  Service: Endoscopy;  Laterality: N/A;  gastric and gastric mucosa  . BIOPSY N/A 12/03/2012   Procedure: BIOPSY;  Surgeon: Daneil Dolin, MD;  Location: [facility];  Service: Endoscopy;  Laterality: N/A;  . COLONOSCOPY WITH PROPOFOL N/A 09/03/2012   OIZ:TIWPYKD polyp-removed as outlined above. Prominent internal hemorrhoids. Tubular adenoma  . ESOPHAGOGASTRODUODENOSCOPY (EGD) WITH PROPOFOL N/A 09/03/2012   XIP:JASNKN hernia. Gastric diverticulum. Gastric ulcers with associated erosions. Duodenal erosions. Status post gastric biopsy. H.PYLORI gastritis   . ESOPHAGOGASTRODUODENOSCOPY (EGD) WITH PROPOFOL N/A 12/03/2012   Dr. Gala Romney: gastric diverticulum, gastric erosions and scar. Previously noted gastric ulcer completed healed. Biopsy without H.pylori.   . ESOPHAGOGASTRODUODENOSCOPY (EGD) WITH PROPOFOL N/A 01/23/2016   Procedure: ESOPHAGOGASTRODUODENOSCOPY (EGD) WITH PROPOFOL;  Surgeon: Doran Stabler, MD;  [facility]: [facility];  Service: Gastroenterology;  Laterality: N/A;  . I&D EXTREMITY Right 07/11/2016   Procedure: DELAY PRIMARY CLOSURE FOOT AMPUTATION;  Surgeon: Edrick Kins, DPM;  Location: [facility];  Service: Podiatry;  Laterality: Right;  . LIVER BIOPSY  2005   Done in [place], [place]. Chronic hepatitis with mild periportal inflammation, lobular unicellular necrosis and portal fibrosis. Grade 2, stage 1-2.  . LOWER EXTREMITY ANGIOGRAPHY Right 06/03/2016   Procedure: Lower Extremity Angiography;  Surgeon: Waynetta Sandy, MD;  Location: [facility];  Service: Cardiovascular;  Laterality: Right;  . MAXIMUM ACCESS (MAS)POSTERIOR LUMBAR INTERBODY FUSION (PLIF) 1 LEVEL N/A 11/25/2013   Procedure: FOR MAXIMUM ACCESS (MAS) POSTERIOR LUMBAR INTERBODY FUSION (PLIF) 1 LEVEL;  Surgeon: Eustace Moore, MD;  Location: [facility];  Service: Neurosurgery;  Laterality: N/A;  FOR MAXIMUM ACCESS (MAS) POSTERIOR LUMBAR INTERBODY FUSION (PLIF) 1 LEVEL LUMBAR 3-4  . PERIPHERAL VASCULAR ATHERECTOMY Right 06/03/2016   Procedure: Peripheral Vascular Atherectomy;  Surgeon: Waynetta Sandy, MD;  Location: [facility];  Service: Cardiovascular;   Laterality: Right;  SFA WITH STENT  . PERIPHERAL VASCULAR CATHETERIZATION Left 08/01/2015   Procedure: Lower Extremity Angiography;  Surgeon: Serafina Mitchell, MD;  Location: [facility];  Service: Cardiovascular;  Laterality: Left;  . PERIPHERAL VASCULAR CATHETERIZATION N/A 08/01/2015   Procedure: Abdominal Aortogram;  Surgeon: Serafina Mitchell, MD;  Location: [facility];  Service: Cardiovascular;  Laterality: N/A;  . PERIPHERAL VASCULAR CATHETERIZATION N/A 08/08/2015   Procedure: Abdominal Aortogram w/Lower Extremity;  Surgeon: Serafina Mitchell, MD;  Location: [facility];  Service: Cardiovascular;  Laterality: N/A;  . PERIPHERAL VASCULAR CATHETERIZATION Left 08/08/2015   Procedure: Peripheral Vascular Balloon Angioplasty;  Surgeon: Serafina Mitchell, MD;  Location: [facility];  Service: Cardiovascular;  Laterality: Left;  left popiteal artery, left peronealtrunk, left post tibial  . POLYPECTOMY N/A 09/03/2012   Procedure: POLYPECTOMY;  Surgeon: Daneil Dolin, MD;  [facility];  Service: Endoscopy;  Laterality: N/A;  cecal polyp  . TONSILLECTOMY    . TRANSMETATARSAL AMPUTATION Right 06/13/2016   Procedure: RIGHT TRANSMETATARSAL AMPUTATION;  Surgeon: Waynetta Sandy, MD;  Location: [facility];  Service: Vascular;  Laterality: Right;  . TRANSMETATARSAL AMPUTATION Right 07/10/2016   Procedure: REVISION RIGHT TRANSMETATARSAL AMPUTATION;  Surgeon: Waynetta Sandy, MD;  Location: [facility];  Service: Vascular;  Laterality: Right;       Home Medications    Prior to Admission medications   Medication Sig Start Date End Date Taking? Authorizing Provider  acetaminophen (TYLENOL) 325 MG tablet Take 650 mg by mouth every 4 (four) hours as needed for fever. For temp greater than 100.4   Yes [provider]  allopurinol (ZYLOPRIM) 100 MG tablet Take 200 mg by mouth every evening.   Yes [provider]  Amino Acids-Protein Hydrolys (FEEDING SUPPLEMENT,  PRO-STAT SUGAR FREE 64,) LIQD Take 30 mLs by mouth 2 (two) times daily. 07/18/16  Yes Lavina Hamman, MD  aspirin EC 81 MG tablet Take 81 mg by mouth daily.   Yes [provider]  carvedilol (COREG) 3.125 MG tablet Take 1 tablet (3.125 mg total) by mouth 2 (two) times daily with a meal. 08/10/15  Yes Mikhail, Velta Addison, DO  Cholecalciferol (VITAMIN D3) 2000 units TABS Take 1 tablet by mouth daily.   Yes [provider]  clopidogrel (PLAVIX) 75 MG tablet Take 1 tablet (75 mg total) by mouth daily with breakfast. 06/05/16  Yes Dhungel, Nishant, MD  ferrous sulfate 325 (65 FE) MG tablet Take 325 mg by mouth 2 (two) times daily.    Yes [provider]  gabapentin (NEURONTIN) 600 MG tablet Take 1 tablet (600 mg total) by mouth 3 (three) times daily. 07/15/16  Yes Domenic Polite, MD  insulin aspart (NOVOLOG) 100 UNIT/ML injection Inject 5 Units into the skin 3 (three) times daily after meals. Patient taking differently: Inject 2-8 Units into the skin as directed. Inject 5 units 3 times daily after meals.  Inject 3 times daily before meals as per sliding scale: 150-200= 2 units, 201-250= 4 units, 251-300= 6 units, 301-350= 8 units, if >251 notify MD 07/15/16  Yes Domenic Polite, MD  insulin glargine (LANTUS) 100 UNIT/ML injection Inject 0.25 mLs (25 Units total) into the skin at bedtime. Patient taking differently: Inject 20 Units into the skin at bedtime.  07/15/16  Yes Domenic Polite, MD  lisinopril (PRINIVIL,ZESTRIL) 5 MG tablet Take 5 mg by mouth daily.    Yes [provider]  magnesium hydroxide (MILK OF MAGNESIA) 400 MG/5ML suspension Take 30 mLs by mouth daily as needed for mild constipation.   Yes [provider]  metFORMIN (GLUCOPHAGE) 500 MG tablet Take 500 mg by mouth daily.    Yes [provider]  morphine (MS CONTIN) 15 MG 12 hr tablet Take 1 tablet (15 mg total) by mouth every 12 (twelve) hours. 07/15/16  Yes Domenic Polite, MD  Oxycodone HCl 10 MG  TABS Take 1 tablet (10 mg total) by mouth every 4 (four) hours as needed (pain). 08/27/16  Yes Rhyne, Samantha J, PA-C  polyethylene glycol (MIRALAX / GLYCOLAX) packet Take 17 g by mouth 2 (two) times daily. Patient taking differently: Take 17 g by mouth every 12 (twelve) hours as needed for mild constipation.  07/15/16  Yes Domenic Polite, MD  Probiotic CAPS Take 1 capsule by mouth 2 (two) times daily.   Yes [provider]  senna-docusate (SENOKOT-S) 8.6-50 MG tablet Take 1 tablet by mouth 2 (two) times daily. Patient taking differently: Take 1 tablet by mouth 2 (two) times daily. Hold for loose stools 06/17/16  Yes Rama, Venetia Maxon, MD  simvastatin (ZOCOR) 20 MG tablet Take 20 mg by mouth daily.    Yes [provider]  tamsulosin (FLOMAX) 0.4 MG  CAPS capsule Take 1 capsule (0.4 mg total) by mouth daily. 08/10/15  Yes Mikhail, Velta Addison, DO  torsemide (DEMADEX) 20 MG tablet Take 20 mg by mouth daily.    Yes [provider]  vitamin C (ASCORBIC ACID) 500 MG tablet Take 500 mg by mouth 2 (two) times daily.   Yes [provider]  Vitamin D, Ergocalciferol, (DRISDOL) 50000 units CAPS capsule Take 50,000 Units by mouth every Friday. Takes every Friday    Yes [provider]    Family History Family History  Problem Relation Age of Onset  . Breast cancer Mother   . Heart failure Mother   . Diabetes Father   . Hypertension Father   . Heart disease Father   . Hyperlipidemia Father   . Diabetes Sister   . Hypertension Sister   . Breast cancer Sister   . Colon cancer Neg Hx   . Liver disease Neg Hx     Social History Social History  Substance Use Topics  . Smoking status: Former Smoker    Packs/day: 1.00    Years: 47.00    Types: Cigarettes    Quit date: 07/13/2015  . Smokeless tobacco: Never Used  . Alcohol use No     Comment:  none 08/15/2015 "stopped drinking 3-4 years ago"     Allergies   No known allergies   Review of Systems Review of  Systems  Constitutional: Negative for appetite change and fatigue.  HENT: Positive for nosebleeds. Negative for congestion, ear discharge and sinus pressure.   Eyes: Negative for discharge.  Respiratory: Negative for cough.   Cardiovascular: Negative for chest pain.  Gastrointestinal: Negative for abdominal pain and diarrhea.  Genitourinary: Negative for frequency and hematuria.  Musculoskeletal: Negative for back pain.  Skin: Negative for rash.  Neurological: Negative for seizures and headaches.  Psychiatric/Behavioral: Negative for hallucinations.     Physical Exam Updated Vital Signs BP 138/79 (BP Location: Left Arm)   Pulse 81   Temp 98.2 F (36.8 C) (Oral)   Resp 18   SpO2 100%   Physical Exam  Constitutional: He is oriented to person, place, and time. He appears well-developed.  HENT:  Head: Normocephalic.  Patient having a nosebleed from his right nostril  Eyes: Conjunctivae and EOM are normal. No scleral icterus.  Neck: Neck supple. No thyromegaly present.  Cardiovascular: Normal rate and regular rhythm.  Exam reveals no gallop and no friction rub.   No murmur heard. Pulmonary/Chest: No stridor. He has no wheezes. He has no rales. He exhibits no tenderness.  Abdominal: He exhibits no distension. There is no tenderness. There is no rebound.  Musculoskeletal: Normal range of motion. He exhibits no edema.  Lymphadenopathy:    He has no cervical adenopathy.  Neurological: He is oriented to person, place, and time. He exhibits normal muscle tone. Coordination normal.  Skin: No rash noted. No erythema.  Psychiatric: He has a normal mood and affect. His behavior is normal.     ED Treatments / Results  Labs (all labs ordered are listed, but only abnormal results are displayed) Labs Reviewed  CBC WITH DIFFERENTIAL/PLATELET - Abnormal; Notable for the following:       Result Value   RBC 2.40 (*)    Hemoglobin 6.8 (*)    HCT 21.5 (*)    RDW 15.9 (*)    All other  components within normal limits  BASIC METABOLIC PANEL - Abnormal; Notable for the following:    Sodium 133 (*)  Chloride 100 (*)    Glucose, Bld 172 (*)    BUN 24 (*)    All other components within normal limits  PREPARE RBC (CROSSMATCH)  TYPE AND SCREEN    EKG  EKG Interpretation None       Radiology No results found.  Procedures Procedures (including critical care time)  Medications Ordered in ED Medications  0.9 %  sodium chloride infusion ( Intravenous New Bag/Given 10/05/16 0917)  oxyCODONE-acetaminophen (PERCOCET/ROXICET) 5-325 MG per tablet 2 tablet (2 tablets Oral Given 10/05/16 1307)     Initial Impression / Assessment and Plan / ED Course  I have reviewed the triage vital signs and the nursing notes.  Pertinent labs & imaging results that were available during my care of the patient were reviewed by me and considered in my medical decision making (see chart for details).     Patient has nosebleed from the right nostril that was controlled with Afrin and pressure. His hemoglobin was 6.8. He has a history of anemia and was transfused last time he was below 7. Patient was transfused 1 unit of blood in the emergency department and will follow-up with his primary care doctor. He will return if any more problems with his nosebleed. He was told to use Afrin if he bleeds again and put pressure on his nose for 20 minutes and if the bleeding recurs he should come back to the emergency department. Patient will not take his Plavix today or tomorrow  Final Clinical Impressions(s) / ED Diagnoses   Final diagnoses:  Epistaxis  Iron deficiency anemia due to chronic blood loss    New Prescriptions New Prescriptions   No medications on file     Milton Ferguson, MD 10/05/16 1512

## 2016-10-05 NOTE — ED Notes (Signed)
Eating dinner has no current complaints

## 2016-10-05 NOTE — ED Notes (Signed)
Updated pt's sister with permission from patient.

## 2016-10-05 NOTE — ED Notes (Signed)
Report to [facility], caregiver

## 2016-10-06 LAB — BPAM RBC
BLOOD PRODUCT EXPIRATION DATE: 201808302359
ISSUE DATE / TIME: 201808251244
UNIT TYPE AND RH: 5100

## 2016-10-06 LAB — TYPE AND SCREEN
ABO/RH(D): O POS
ANTIBODY SCREEN: NEGATIVE
UNIT DIVISION: 0

## 2016-10-09 ENCOUNTER — Encounter: Payer: Self-pay | Admitting: Family

## 2016-10-18 ENCOUNTER — Encounter: Payer: Self-pay | Admitting: Family

## 2016-10-21 ENCOUNTER — Ambulatory Visit (INDEPENDENT_AMBULATORY_CARE_PROVIDER_SITE_OTHER): Payer: Self-pay | Admitting: Family

## 2016-10-21 ENCOUNTER — Encounter: Payer: Self-pay | Admitting: Family

## 2016-10-21 VITALS — BP 102/66 | HR 84 | Temp 97.7°F | Resp 16 | Wt 212.0 lb

## 2016-10-21 DIAGNOSIS — R609 Edema, unspecified: Secondary | ICD-10-CM

## 2016-10-21 DIAGNOSIS — Z4802 Encounter for removal of sutures: Secondary | ICD-10-CM

## 2016-10-21 DIAGNOSIS — Z89511 Acquired absence of right leg below knee: Secondary | ICD-10-CM

## 2016-10-21 DIAGNOSIS — I779 Disorder of arteries and arterioles, unspecified: Secondary | ICD-10-CM

## 2016-10-21 DIAGNOSIS — Z89512 Acquired absence of left leg below knee: Secondary | ICD-10-CM

## 2016-10-21 MED ORDER — CEPHALEXIN 500 MG PO CAPS: 500.0000 mg | ORAL_CAPSULE | Freq: Three times a day (TID) | ORAL | 0 refills | Status: DC

## 2016-10-21 NOTE — Progress Notes (Signed)
Postoperative Visit   History of Present Illness  Cory Weber is a 66 y.o. male who is s/p right below knee amputation on 08-22-16 by Dr. Donzetta Matters for necrotic wound of right Lisfranc amputation. The pt has a history of a left-sided below-knee amputation had a right sided Lisfranc amputation that has failed to heal.  He returns today for staples removal and wound check.  He denies fever or chills. He denies pain in the right BKA stump other than when palpated.   He was using a prosthesis on his left BKA and plans on using a prosthesis on his right BKA once it heals.  He states he is connected with a prosthetic company that is not Museum/gallery curator, is not [employer].  He is residing in [place] at [facility], states [place]".   He has dependent edema in the right BKA stump and several areas of staple abscess at the staples insertion sites. 09-27-16 visit: Dependent edema in the right BKA stump, several areas of abscess at the staples insertion sites (not infections, local irritations from staples). Incision edges are well proximated. There are several areas of weeping of red tinged watery drainage, mostly at the lateral edge of the incision.  Staples are intact.  Every other staple removed. Absorbant and compression dressing applied to right BKA stump.  He has dependent edema in the right BKA stump, see Patient Instructions for elevation instructions.  Return in 2 weeks for wound check and removal or remaining staples.     For VQI Use Only  PRE-ADM LIVING: Nursing home  AMB STATUS: Wheelchair   Past Medical History:  Diagnosis Date  . Anemia   . Asthma   . BPH (benign prostatic hyperplasia)   . Cellulitis and abscess of foot 07/2015  . Chronic diastolic heart failure ([place])    per Nursing facility  . Chronic kidney disease    CKD stage 3  . CVA (cerebral vascular accident) ([place]) 2015   denies residual on 08/15/2015  . Hepatitis C    states he was diagnosed in 2007 or 2007 while living in [place], [place]  . Hyperlipidemia   . Hypertension   . Ischemia of foot 05/31/2016  . Osteomyelitis ([place])    per Nursing Facility  . Peripheral neuropathy   . Pneumonia X 1  . PVD (peripheral vascular disease) ([place])   . Type II diabetes mellitus ([place])   . 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 ABDOMINAL AORTOGRAM N/A 06/03/2016   Procedure: Abdominal Aortogram;  Surgeon: Waynetta Sandy, MD;  Location: [facility];  Service: Cardiovascular;  Laterality: N/A;  . AMPUTATION Left 08/16/2015   Procedure: LEFT BELOW THE KNEE AMPUTATION ;  Surgeon: Serafina Mitchell, MD;  Location: [facility];  Service: Vascular;  Laterality: Left;  . AMPUTATION Right 08/22/2016   Procedure: AMPUTATION BELOW KNEE-RIGHT;  Surgeon: Waynetta Sandy, MD;  Location: [facility];  Service: Vascular;  Laterality: Right;  . BACK SURGERY    . BELOW KNEE LEG AMPUTATION Left 08/16/2015  . BIOPSY N/A 09/03/2012   Procedure: BIOPSY;  Surgeon: Daneil Dolin, MD;  [facility]: [facility];  Service: Endoscopy;  Laterality: N/A;  gastric and gastric mucosa  . BIOPSY N/A 12/03/2012   Procedure: BIOPSY;  Surgeon: Daneil Dolin, MD;  Location: [facility];  Service: Endoscopy;  Laterality: N/A;  . COLONOSCOPY WITH PROPOFOL N/A 09/03/2012   OMV:EHMCNOB  polyp-removed as outlined above. Prominent internal hemorrhoids. Tubular adenoma  . ESOPHAGOGASTRODUODENOSCOPY (EGD) WITH PROPOFOL N/A 09/03/2012   GGY:IRSWNI hernia. Gastric diverticulum. Gastric ulcers with associated erosions. Duodenal erosions. Status post gastric biopsy. H.PYLORI gastritis   . ESOPHAGOGASTRODUODENOSCOPY (EGD) WITH PROPOFOL N/A 12/03/2012   Dr. Gala Romney: gastric diverticulum, gastric erosions and scar. Previously noted gastric ulcer completed healed. Biopsy without H.pylori.   . ESOPHAGOGASTRODUODENOSCOPY (EGD) WITH PROPOFOL N/A 01/23/2016   Procedure:  ESOPHAGOGASTRODUODENOSCOPY (EGD) WITH PROPOFOL;  Surgeon: Doran Stabler, MD;  [facility]: [facility];  Service: Gastroenterology;  Laterality: N/A;  . I&D EXTREMITY Right 07/11/2016   Procedure: DELAY PRIMARY CLOSURE FOOT AMPUTATION;  Surgeon: Edrick Kins, DPM;  Location: [facility];  Service: Podiatry;  Laterality: Right;  . LIVER BIOPSY  2005   Done in [place], [place]. Chronic hepatitis with mild periportal inflammation, lobular unicellular necrosis and portal fibrosis. Grade 2, stage 1-2.  . LOWER EXTREMITY ANGIOGRAPHY Right 06/03/2016   Procedure: Lower Extremity Angiography;  Surgeon: Waynetta Sandy, MD;  Location: [facility];  Service: Cardiovascular;  Laterality: Right;  . MAXIMUM ACCESS (MAS)POSTERIOR LUMBAR INTERBODY FUSION (PLIF) 1 LEVEL N/A 11/25/2013   Procedure: FOR MAXIMUM ACCESS (MAS) POSTERIOR LUMBAR INTERBODY FUSION (PLIF) 1 LEVEL;  Surgeon: Eustace Moore, MD;  Location: [facility];  Service: Neurosurgery;  Laterality: N/A;  FOR MAXIMUM ACCESS (MAS) POSTERIOR LUMBAR INTERBODY FUSION (PLIF) 1 LEVEL LUMBAR 3-4  . PERIPHERAL VASCULAR ATHERECTOMY Right 06/03/2016   Procedure: Peripheral Vascular Atherectomy;  Surgeon: Waynetta Sandy, MD;  Location: [facility];  Service: Cardiovascular;  Laterality: Right;  SFA WITH STENT  . PERIPHERAL VASCULAR CATHETERIZATION Left 08/01/2015   Procedure: Lower Extremity Angiography;  Surgeon: Serafina Mitchell, MD;  Location: [facility];  Service: Cardiovascular;  Laterality: Left;  . PERIPHERAL VASCULAR CATHETERIZATION N/A 08/01/2015   Procedure: Abdominal Aortogram;  Surgeon: Serafina Mitchell, MD;  Location: [facility];  Service: Cardiovascular;  Laterality: N/A;  . PERIPHERAL VASCULAR CATHETERIZATION N/A 08/08/2015   Procedure: Abdominal Aortogram w/Lower Extremity;  Surgeon: Serafina Mitchell, MD;  Location: [facility];  Service: Cardiovascular;  Laterality: N/A;  . PERIPHERAL VASCULAR  CATHETERIZATION Left 08/08/2015   Procedure: Peripheral Vascular Balloon Angioplasty;  Surgeon: Serafina Mitchell, MD;  Location: [facility];  Service: Cardiovascular;  Laterality: Left;  left popiteal artery, left peronealtrunk, left post tibial  . POLYPECTOMY N/A 09/03/2012   Procedure: POLYPECTOMY;  Surgeon: Daneil Dolin, MD;  [facility];  Service: Endoscopy;  Laterality: N/A;  cecal polyp  . TONSILLECTOMY    . TRANSMETATARSAL AMPUTATION Right 06/13/2016   Procedure: RIGHT TRANSMETATARSAL AMPUTATION;  Surgeon: Waynetta Sandy, MD;  Location: [facility];  Service: Vascular;  Laterality: Right;  . TRANSMETATARSAL AMPUTATION Right 07/10/2016   Procedure: REVISION RIGHT TRANSMETATARSAL AMPUTATION;  Surgeon: Waynetta Sandy, MD;  Location: [facility];  Service: Vascular;  Laterality: Right;    Social History   Social History  . Marital status: Divorced    Spouse name: N/A  . Number of children: 1  . Years of education: N/A   Occupational History  . disabled    Social History Main Topics  . Smoking status: Former Smoker    Packs/day: 1.00    Years: 47.00    Types: Cigarettes    Quit date: 07/13/2015  . Smokeless tobacco: Never Used  . Alcohol use No     Comment:  none 08/15/2015 "stopped drinking 3-4  years ago"  . Drug use: No     Comment: 08/15/2015 "last used cocaine 3-4 months ago"  . Sexual activity: No   Other Topics Concern  . Not on file   Social History Narrative  . No narrative on file    Allergies  Allergen Reactions  . No Known Allergies        Physical Examination  Vitals:   10/21/16 1029  BP: 102/66  Pulse: 84  Resp: 16  Temp: 97.7 F (36.5 C)  TempSrc: Oral  SpO2: 96%  Weight: 212 lb (96.2 kg)   Body mass index is 28.75 kg/m.  Right BKA medial view   Right BKA lateral view   Right BKA distal view     Staples are intact. Foul smelling incision with mild/moderate dependent edema and maceration at the incision.    Medical Decision Making  Cory Weber is a 66 y.o. male who presents s/p right below knee amputation on 08-22-16 by Dr. Donzetta Matters for necrotic wound of right Lisfranc amputation. The pt has a history of a left-sided below-knee amputation.   Keflex 500 mg po tid, disp #42, 0 refills. Remaining staples removed, wet to dry NS dressings applied to somewhat macerated incision, to be continued by the nursing facility bid or qd.  Right BKA incision may separate due to macerated appearance, mild/moderate foul odor. No constitutional symptoms: no fever or chills.  Return in 2 weeks to see Dr. Donzetta Matters for wound check.    Kyion Gautier, Sharmon Leyden, RN, MSN, FNP-C [facility] Office: [phone]  10/21/2016, 10:39 AM  Clinic MD: Trula Slade

## 2016-10-21 NOTE — Patient Instructions (Signed)

## 2016-11-08 ENCOUNTER — Ambulatory Visit (INDEPENDENT_AMBULATORY_CARE_PROVIDER_SITE_OTHER): Payer: Self-pay | Admitting: Family

## 2016-11-08 ENCOUNTER — Encounter: Payer: Self-pay | Admitting: Family

## 2016-11-08 VITALS — BP 103/69 | HR 79 | Temp 97.8°F | Resp 20 | Ht 72.0 in | Wt 212.0 lb

## 2016-11-08 DIAGNOSIS — Z89511 Acquired absence of right leg below knee: Secondary | ICD-10-CM

## 2016-11-08 DIAGNOSIS — I779 Disorder of arteries and arterioles, unspecified: Secondary | ICD-10-CM

## 2016-11-08 DIAGNOSIS — Z4889 Encounter for other specified surgical aftercare: Secondary | ICD-10-CM

## 2016-11-08 DIAGNOSIS — Z89512 Acquired absence of left leg below knee: Secondary | ICD-10-CM

## 2016-11-08 NOTE — Progress Notes (Signed)
    Postoperative Visit   History of Present Illness  Cory Weber is a 66 y.o. male who is s/p right below knee amputation on 08-22-16 by Dr. Donzetta Matters for necrotic wound of right Lisfranc amputation. The pt has a history of a left-sided below-knee amputation hada right sided Lisfranc amputation that has failed to heal.  He returns today for wound check.  He denies fever or chills. He denies pain in the right BKA stump.  He was using a prosthesis on his left BKA and plans on using a prosthesis on his right BKA once it heals.  He states he is connected with a prosthetic company that is not Museum/gallery cur[employer], is not [employer], is [employer].  He is residing in [place] at [facility], states he may transfer to another facility [place]state".   I last evaluated pt on 10-21-16. At that time remaining staples were removed.  Foul smelling incision with mild/moderate dependent edema and maceration at the incision. Keflex 500 mg po tid, disp #42, 0 refills. Wet to dry NS dressings applied to somewhat macerated incision, to be continued by the nursing facility bid or qd.  Right BKA incision may separate due to macerated appearance, mild/moderate foul odor. No constitutional symptoms: no fever or chills.  I advised that pt return in 2 weeks to see Dr. Donzetta Matters for wound check.    For VQI Use Only  PRE-ADM LIVING: Nursing home  AMB STATUS: Wheelchair  Physical Examination  Vitals:   11/08/16 1050  BP: 103/69  Pulse: 79  Resp: 20  Temp: 97.8 F (36.6 C)  TempSrc: Oral  SpO2: 98%  Weight: 212 lb (96.2 kg)  Height: 6' (1.829 m)   Body mass index is 28.75 kg/m.    Right BKA, lateral view    Right BKA, medial view     Right BKA incision has healed a great deal, no more maceration present, less edema, almost no edema present at right BKA stump. He is taking the Keflex as prescribed.    Medical Decision Making  Cory Weber is a 66 y.o. male who presents s/p right  below-the-knee amputation on 08-22-16. Right BKA stump is healing well, dependent edema has mostly resolved, no more macerated appearance, 2 small open areas of incision remain.  Finish Keflex prescription.  He has used [employer] prosthetic company for his left BKA prosthesis.  Continue damp to dry NS dressing changes to right BKA incision until small open areas of incision heal, with compression wrap to stump. Elevate right BKA above heart overnight and as often as possible during the day.   Dr. Donzetta Matters spoke with and examined pt. Pt to return in a month, incision should be completely healed by then. Will then give script for prosthesis fitting for right BKA.    Neelie Welshans, Sharmon Leyden, RN, MSN, FNP-C [facility] Office: [phone]  11/08/2016, 11:11 AM  Clinic MD: Donzetta Matters

## 2016-11-08 NOTE — Patient Instructions (Signed)

## 2016-12-06 ENCOUNTER — Ambulatory Visit (INDEPENDENT_AMBULATORY_CARE_PROVIDER_SITE_OTHER): Payer: Self-pay | Admitting: Family

## 2016-12-06 ENCOUNTER — Encounter: Payer: Self-pay | Admitting: Family

## 2016-12-06 VITALS — BP 134/76 | HR 84 | Temp 98.6°F | Resp 20 | Ht 72.0 in | Wt 212.0 lb

## 2016-12-06 DIAGNOSIS — Z89511 Acquired absence of right leg below knee: Secondary | ICD-10-CM

## 2016-12-06 DIAGNOSIS — I779 Disorder of arteries and arterioles, unspecified: Secondary | ICD-10-CM

## 2016-12-06 DIAGNOSIS — Z4889 Encounter for other specified surgical aftercare: Secondary | ICD-10-CM

## 2016-12-06 DIAGNOSIS — Z89512 Acquired absence of left leg below knee: Secondary | ICD-10-CM

## 2016-12-06 NOTE — Patient Instructions (Signed)
Living With an Amputation  The most common causes of amputation from the hip down include:   Diseases that:  ? Reduce the blood flow to an area of your body.  ? Decrease your body's ability to fight infection.   Traumatic injuries that cause significant damage to body tissues.   Birth defects.   Cancerous lumps (malignant tumors).  Amputation above the hip is usually the result of trauma or birth defect. Disease is a less common cause.  Living with an amputation can be challenging, but you can still live a long, productive life.  WHAT ARE THE COMMON CHALLENGES OF LIVING WITH AN AMPUTATION?  The most common challenges are mobility and self-care. With some new habits, though, it is often possible to do all of the activities you used to do.  You may be able to use a device that substitutes for your limb (prosthesis). The prosthesis helps you adapt more quickly to these challenges. Your health care provider can help select a prosthesis to meet your needs. A person who helps you choose and fits you with a prosthesis (prosthetist) may also help.  A rehabilitation program can also help you gain mobility and self-reliance. Your rehabilitation team may include:   Physicians.   Physical and occupational therapists.   Prosthetists.   Nurses.   Social workers.   Psychologists.   Dietitians.  Your rehabilitation team will help you with all aspects of recovery and returning to work, home, sports, and your community. You will learn to:   Get around safely.   Adjust your home.   Exercise.   Use a prosthetic.   Work through emotional challenges.   Connect with other people who have gone through the same experience.  Additional challenges may include:   Grieving period.   Body image issues.   Lifestyle issues, such as sex.   Maintaining a healthy weight.  These issues are normal. Discuss these with your rehabilitation team.  WHEN CAN I RETURN TO MY REGULAR ACTIVITIES?   Returning to your normal activities is part  of healing. Changes can often be made to equipment that allow you to return to a sport or hobby. Some companies design special equipment for this. Discuss all of your leisure interests with your health care provider and prosthetist.  WHEN CAN I RETURN TO WORK?  When you are ready to return to work, your therapists can perform job site evaluations and make recommendations to help you perform your job. You may not be able to return to your same job. Your local Office of Vocational Rehabilitation can assist you in job retraining.  FOR MORE INFORMATION:  Visit these online resources. You can find tips on everything from getting dressed and using bathrooms to driving and travel considerations. These resources can also connect you to a network of emotional support, activities, and innovations. You may also search the Internet to find a local support group.   Amputee Coalition: http://www.amputee-coalition.org/ensuring-fall-safety/   Amputee Support Groups: http://amputee.supportgroups.com   Daily Strength: http://www.dailystrength.org/c/Amputees/support-group   National Amputee Foundation: http://www.nationalamputation.org   National Center on Health, Physical Activity and Disability: http://www.nchpad.org   Disabled Sports [place]: http://www.disabledsportsusa.org   American Academy of Orthotists and Prosthetists: http://www.oandp.org  This information is not intended to replace advice given to you by your health care provider. Make sure you discuss any questions you have with your health care provider.  Document Released: 10/20/2001 Document Revised: 02/18/2014 Document Reviewed: 06/15/2013  Elsevier Interactive Patient Education  2017 [employer]

## 2016-12-06 NOTE — Progress Notes (Signed)
Postoperative Visit   History of Present Illness  Cory Weber is a 66 y.o. male who is s/p right below knee amputation on 08-22-16 by Dr. Donzetta Matters for necrotic wound of right Lisfranc amputation. The pt has a history of a left-sided below-knee amputation in July 2017, and hada right sided Lisfranc amputation that has failed to heal.  He returns today for wound check.  He denies fever or chills. He denies pain in the right BKA stump.  He was using a prosthesis on his left BKA and plans on using a prosthesis on his right BKA once it heals.  He states he is connected with a prosthetic company that is not [employer], is not [employer], is [employer].  He is residing in [place] at [facility], states [place]".   I evaluated pt on 10-21-16. At that time remaining staples were removed.  Foul smelling incision with mild/moderate dependent edema and maceration at the incision. Keflex 500 mg po tid, disp #42, 0 refills. Wet to dry NS dressings applied to somewhat macerated incision, to be continued by the nursing facility bid or qd.  Right BKA incision may separate due to macerated appearance, mild/moderate foul odor. No constitutional symptoms: no fever or chills.  I advised that pt return in 2 weeks to see Dr. Donzetta Matters for wound check.   I last evaluated pt on 11-08-16. At that time right BKA stump was healing well, dependent edema had mostly resolved, no more macerated appearance, 2 small open areas of incision remained.  Finish Keflex prescription.  Continue damp to dry NS dressing changes to right BKA incision until small open areas of incision heal, with compression wrap to stump. Elevate right BKA above heart overnight and as often as possible during the day.   Dr. Donzetta Matters spoke with and examined pt. Pt was to return in a month, incision should be completely healed by then. Will then give script for prosthesis fitting for right BKA.  He returns today  for right BKA stump evaluation.    For VQI Use Only  PRE-ADM LIVING: Nursing home  AMB STATUS: Wheelchair   Past Medical History:  Diagnosis Date  . Anemia   . Asthma   . BPH (benign prostatic hyperplasia)   . Cellulitis and abscess of foot 07/2015  . Chronic diastolic heart failure ([place])    per Nursing facility  . Chronic kidney disease    CKD stage 3  . CVA (cerebral vascular accident) ([place]) 2015   denies residual on 08/15/2015  . Hepatitis C    states he was diagnosed in 2007 or 2007 while living in [place], [place]  . Hyperlipidemia   . Hypertension   . Ischemia of foot 05/31/2016  . Osteomyelitis ([place])    per Nursing Facility  . Peripheral neuropathy   . Pneumonia X 1  . PVD (peripheral vascular disease) ([place])   . Type II diabetes mellitus ([place])   . 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 ABDOMINAL AORTOGRAM N/A 06/03/2016   Procedure: Abdominal Aortogram;  Surgeon: Waynetta Sandy, MD;  Location: [place] [facility];  Service: Cardiovascular;  Laterality: N/A;  . AMPUTATION Left 08/16/2015   Procedure: LEFT BELOW THE KNEE AMPUTATION ;  Surgeon: Serafina Mitchell, MD;  Location: [facility];  Service: Vascular;  Laterality: Left;  . AMPUTATION Right 08/22/2016   Procedure: AMPUTATION BELOW KNEE-RIGHT;  Surgeon: Waynetta Sandy,  MD;  Location: [place];  Service: Vascular;  Laterality: Right;  . BACK SURGERY    . BELOW KNEE LEG AMPUTATION Left 08/16/2015  . BIOPSY N/A 09/03/2012   Procedure: BIOPSY;  Surgeon: Daneil Dolin, MD;  [facility]: [facility];  Service: Endoscopy;  Laterality: N/A;  gastric and gastric mucosa  . BIOPSY N/A 12/03/2012   Procedure: BIOPSY;  Surgeon: Daneil Dolin, MD;  Location: [facility];  Service: Endoscopy;  Laterality: N/A;  . COLONOSCOPY WITH PROPOFOL N/A 09/03/2012   QMG:[id number] polyp-removed as outlined above. Prominent internal hemorrhoids. Tubular adenoma  . ESOPHAGOGASTRODUODENOSCOPY (EGD)  WITH PROPOFOL N/A 09/03/2012   KDT:[id number] hernia. Gastric diverticulum. Gastric ulcers with associated erosions. Duodenal erosions. Status post gastric biopsy. H.PYLORI gastritis   . ESOPHAGOGASTRODUODENOSCOPY (EGD) WITH PROPOFOL N/A 12/03/2012   Dr. Gala Romney: gastric diverticulum, gastric erosions and scar. Previously noted gastric ulcer completed healed. Biopsy without H.pylori.   . ESOPHAGOGASTRODUODENOSCOPY (EGD) WITH PROPOFOL N/A 01/23/2016   Procedure: ESOPHAGOGASTRODUODENOSCOPY (EGD) WITH PROPOFOL;  Surgeon: Doran Stabler, MD;  [facility];  Service: Gastroenterology;  Laterality: N/A;  . I&D EXTREMITY Right 07/11/2016   Procedure: DELAY PRIMARY CLOSURE FOOT AMPUTATION;  Surgeon: Edrick Kins, DPM;  Location: [facility];  Service: Podiatry;  Laterality: Right;  . LIVER BIOPSY  2005   Done in [place], [place]. Chronic hepatitis with mild periportal inflammation, lobular unicellular necrosis and portal fibrosis. Grade 2, stage 1-2.  . LOWER EXTREMITY ANGIOGRAPHY Right 06/03/2016   Procedure: Lower Extremity Angiography;  Surgeon: Waynetta Sandy, MD;  Location: [facility];  Service: Cardiovascular;  Laterality: Right;  . MAXIMUM ACCESS (MAS)POSTERIOR LUMBAR INTERBODY FUSION (PLIF) 1 LEVEL N/A 11/25/2013   Procedure: FOR MAXIMUM ACCESS (MAS) POSTERIOR LUMBAR INTERBODY FUSION (PLIF) 1 LEVEL;  Surgeon: Eustace Moore, MD;  Location: [facility];  Service: Neurosurgery;  Laterality: N/A;  FOR MAXIMUM ACCESS (MAS) POSTERIOR LUMBAR INTERBODY FUSION (PLIF) 1 LEVEL LUMBAR 3-4  . PERIPHERAL VASCULAR ATHERECTOMY Right 06/03/2016   Procedure: Peripheral Vascular Atherectomy;  Surgeon: Waynetta Sandy, MD;  Location: [place] [facility];  Service: Cardiovascular;  Laterality: Right;  SFA WITH STENT  . PERIPHERAL VASCULAR CATHETERIZATION Left 08/01/2015   Procedure: Lower Extremity Angiography;  Surgeon: Serafina Mitchell, MD;  Location: [facility];  Service: Cardiovascular;   Laterality: Left;  . PERIPHERAL VASCULAR CATHETERIZATION N/A 08/01/2015   Procedure: Abdominal Aortogram;  Surgeon: Serafina Mitchell, MD;  Location: [place] [facility];  Service: Cardiovascular;  Laterality: N/A;  . PERIPHERAL VASCULAR CATHETERIZATION N/A 08/08/2015   Procedure: Abdominal Aortogram w/Lower Extremity;  Surgeon: Serafina Mitchell, MD;  Location: [facility];  Service: Cardiovascular;  Laterality: N/A;  . PERIPHERAL VASCULAR CATHETERIZATION Left 08/08/2015   Procedure: Peripheral Vascular Balloon Angioplasty;  Surgeon: Serafina Mitchell, MD;  Location: [facility];  Service: Cardiovascular;  Laterality: Left;  left popiteal artery, left peronealtrunk, left post tibial  . POLYPECTOMY N/A 09/03/2012   Procedure: POLYPECTOMY;  Surgeon: Daneil Dolin, MD;  [facility];  Service: Endoscopy;  Laterality: N/A;  cecal polyp  . TONSILLECTOMY    . TRANSMETATARSAL AMPUTATION Right 06/13/2016   Procedure: RIGHT TRANSMETATARSAL AMPUTATION;  Surgeon: Waynetta Sandy, MD;  Location: [facility];  Service: Vascular;  Laterality: Right;  . TRANSMETATARSAL AMPUTATION Right 07/10/2016   Procedure: REVISION RIGHT TRANSMETATARSAL AMPUTATION;  Surgeon: Waynetta Sandy, MD;  Location: [facility];  Service: Vascular;  Laterality: Right;    Social History  Social History  . Marital status: Divorced    Spouse name: N/A  . Number of children: 1  . Years of education: N/A   Occupational History  . disabled    Social History Main Topics  . Smoking status: Former Smoker    Packs/day: 1.00    Years: 47.00    Types: Cigarettes    Quit date: 07/13/2015  . Smokeless tobacco: Never Used  . Alcohol use No     Comment:  none 08/15/2015 "stopped drinking 3-4 years ago"  . Drug use: No     Comment: 08/15/2015 "last used cocaine 3-4 months ago"  . Sexual activity: No   Other Topics Concern  . Not on file   Social History Narrative  . No narrative on file    Allergies  Allergen  Reactions  . No Known Allergies     Current Outpatient Prescriptions on File Prior to Visit  Medication Sig Dispense Refill  . acetaminophen (TYLENOL) 325 MG tablet Take 650 mg by mouth every 4 (four) hours as needed for fever. For temp greater than 100.4    . allopurinol (ZYLOPRIM) 100 MG tablet Take 200 mg by mouth every evening.    . Amino Acids-Protein Hydrolys (FEEDING SUPPLEMENT, PRO-STAT SUGAR FREE 64,) LIQD Take 30 mLs by mouth 2 (two) times daily. 900 mL 0  . aspirin EC 81 MG tablet Take 81 mg by mouth daily.    . carvedilol (COREG) 3.125 MG tablet Take 1 tablet (3.125 mg total) by mouth 2 (two) times daily with a meal. 60 tablet 0  . cephALEXin (KEFLEX) 500 MG capsule Take 1 capsule (500 mg total) by mouth 3 (three) times daily. 42 capsule 0  . Cholecalciferol (VITAMIN D3) 2000 units TABS Take 1 tablet by mouth daily.    . clopidogrel (PLAVIX) 75 MG tablet Take 1 tablet (75 mg total) by mouth daily with breakfast. 30 tablet 0  . ferrous sulfate 325 (65 FE) MG tablet Take 325 mg by mouth 2 (two) times daily.     Marland Kitchen gabapentin (NEURONTIN) 600 MG tablet Take 1 tablet (600 mg total) by mouth 3 (three) times daily.    . insulin aspart (NOVOLOG) 100 UNIT/ML injection Inject 5 Units into the skin 3 (three) times daily after meals. (Patient taking differently: Inject 2-8 Units into the skin as directed. Inject 5 units 3 times daily after meals.  Inject 3 times daily before meals as per sliding scale: 150-200= 2 units, 201-250= 4 units, 251-300= 6 units, 301-350= 8 units, if >251 notify MD)    . insulin glargine (LANTUS) 100 UNIT/ML injection Inject 0.25 mLs (25 Units total) into the skin at bedtime. (Patient taking differently: Inject 20 Units into the skin at bedtime. )    . lisinopril (PRINIVIL,ZESTRIL) 5 MG tablet Take 5 mg by mouth daily.     . magnesium hydroxide (MILK OF MAGNESIA) 400 MG/5ML suspension Take 30 mLs by mouth daily as needed for mild constipation.    . metFORMIN (GLUCOPHAGE)  500 MG tablet Take 500 mg by mouth daily.     Marland Kitchen morphine (MS CONTIN) 15 MG 12 hr tablet Take 1 tablet (15 mg total) by mouth every 12 (twelve) hours. 20 tablet 0  . Oxycodone HCl 10 MG TABS Take 1 tablet (10 mg total) by mouth every 4 (four) hours as needed (pain). 30 tablet 0  . polyethylene glycol (MIRALAX / GLYCOLAX) packet Take 17 g by mouth 2 (two) times daily. (Patient taking differently: Take 17  g by mouth every 12 (twelve) hours as needed for mild constipation. ) 14 each 0  . Probiotic CAPS Take 1 capsule by mouth 2 (two) times daily.    Marland Kitchen senna-docusate (SENOKOT-S) 8.6-50 MG tablet Take 1 tablet by mouth 2 (two) times daily. (Patient taking differently: Take 1 tablet by mouth 2 (two) times daily. Hold for loose stools)    . simvastatin (ZOCOR) 20 MG tablet Take 20 mg by mouth daily.     . tamsulosin (FLOMAX) 0.4 MG CAPS capsule Take 1 capsule (0.4 mg total) by mouth daily. 30 capsule 0  . torsemide (DEMADEX) 20 MG tablet Take 20 mg by mouth daily.     . vitamin C (ASCORBIC ACID) 500 MG tablet Take 500 mg by mouth 2 (two) times daily.    . Vitamin D, Ergocalciferol, (DRISDOL) 50000 units CAPS capsule Take 50,000 Units by mouth every Friday. Takes every Friday     . [DISCONTINUED] ferrous fumarate (HEMOCYTE - 106 MG FE) 325 (106 FE) MG TABS tablet Take 1 tablet (106 mg of iron total) by mouth 2 (two) times daily. (Patient not taking: Reported on 04/01/2014) 60 each 0   No current facility-administered medications on file prior to visit.       Physical Examination  Vitals:   12/06/16 1153  BP: 134/76  Pulse: 84  Resp: 20  Temp: 98.6 F (37 C)  TempSrc: Oral  SpO2: 98%  Weight: 212 lb (96.2 kg)  Height: 6' (1.829 m)   Body mass index is 28.75 kg/m.  Right BKA: no swelling, no erythema, no drainage. Wounds have healed.   Medical Decision Making  SHAFIN POLLIO is a 66 y.o. male who is s/p right below knee amputation on 08-22-16 by Dr. Donzetta Matters for necrotic wound of right  Lisfranc amputation. The pt has a history of a left-sided below-knee amputation in July 2017.  He has a prosthesis for his left BKA.   The patient's stump is healing appropriately with resolution of pre-operative symptoms.   Thank you for allowing [place] to participate in this patient's care.  The patient has been referred for prosthetic fitting.  The patient can follow up with [place] as needed.  NICKEL, Sharmon Leyden, RN, MSN, FNP-C [facility] Office: [phone]  12/06/2016, 12:05 PM  Clinic MD: Donzetta Matters

## 2016-12-31 ENCOUNTER — Encounter: Payer: Self-pay | Admitting: Orthopaedic Surgery

## 2016-12-31 ENCOUNTER — Ambulatory Visit (INDEPENDENT_AMBULATORY_CARE_PROVIDER_SITE_OTHER): Payer: Medicaid Other | Admitting: Orthopaedic Surgery

## 2016-12-31 VITALS — BP 137/88 | HR 87 | Temp 98.1°F

## 2016-12-31 DIAGNOSIS — G8929 Other chronic pain: Secondary | ICD-10-CM | POA: Diagnosis not present

## 2016-12-31 DIAGNOSIS — M25511 Pain in right shoulder: Secondary | ICD-10-CM

## 2016-12-31 NOTE — Progress Notes (Signed)
Subjective:    Patient ID: Cory Weber, male    DOB: June 09, 1950, 66 y.o.   MRN: [id number]  HPI He has right shoulder pain for many months.  He had a skin lesion excised from the right shoulder anteriorly about three months ago.  His shoulder has been hurting since before then.  He has pain with overhead use.  He has no trauma.  He is a resident at local nursing home.  He is a diabetic and has lost his left lower leg having a below the knee amputation.  He is in a wheelchair most of the time.  He uses his arms to push himself.   Review of Systems  HENT: Negative for congestion.   Respiratory: Positive for shortness of breath. Negative for cough.   Cardiovascular: Negative for chest pain and leg swelling.  Endocrine: Positive for cold intolerance.  Musculoskeletal: Positive for arthralgias and gait problem.  Allergic/Immunologic: Positive for environmental allergies.   Past Medical History:  Diagnosis Date  . Anemia   . Asthma   . BPH (benign prostatic hyperplasia)   . Cellulitis and abscess of foot 07/2015  . Chronic diastolic heart failure ([place])    per Nursing facility  . Chronic kidney disease    CKD stage 3  . CVA (cerebral vascular accident) ([place]) 2015   denies residual on 08/15/2015  . GERD (gastroesophageal reflux disease)   . Hepatitis C    states he was diagnosed in 2007 or 2007 while living in [place], [place]  . Hepatitis C   . Hyperlipidemia   . Hypertension   . Ischemia of foot 05/31/2016  . Osteomyelitis ([place])    per Nursing Facility  . Peripheral neuropathy   . Pneumonia X 1  . PVD (peripheral vascular disease) ([place])   . Type II diabetes mellitus ([place])   . 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 Abdominal Aortogram N/A 06/03/2016   Performed by Waynetta Sandy, MD at [facility]  . Abdominal Aortogram N/A 08/01/2015   Performed by Serafina Mitchell, MD at [facility]  . Abdominal  Aortogram w/Lower Extremity N/A 08/08/2015   Performed by Serafina Mitchell, MD at [facility]  . AMPUTATION BELOW KNEE-RIGHT Right 08/22/2016   Performed by Waynetta Sandy, MD at [facility]  . BACK SURGERY    . BELOW KNEE LEG AMPUTATION Left 08/16/2015  . BIOPSY N/A 12/03/2012   Performed by Daneil Dolin, MD at [facility]  . BIOPSY N/A 09/03/2012   Performed by Daneil Dolin, MD at [facility]  . CANCELLED PROCEDURE  07/30/2012   Performed by Daneil Dolin, MD at [facility]  . COLONOSCOPY WITH PROPOFOL N/A 09/03/2012   Performed by Daneil Dolin, MD at [facility]  . DELAY PRIMARY CLOSURE FOOT AMPUTATION Right 07/11/2016   Performed by Edrick Kins, DPM at [facility]  . ESOPHAGOGASTRODUODENOSCOPY (EGD) WITH PROPOFOL N/A 01/23/2016   Performed by Doran Stabler, MD at [facility]  . ESOPHAGOGASTRODUODENOSCOPY (EGD) WITH PROPOFOL N/A 12/03/2012   Performed by Daneil Dolin, MD at [facility]  . ESOPHAGOGASTRODUODENOSCOPY (EGD) WITH PROPOFOL N/A 09/03/2012   Performed by Daneil Dolin, MD at [facility]  . FOR MAXIMUM ACCESS (MAS) POSTERIOR LUMBAR INTERBODY FUSION (PLIF) 1 LEVEL N/A 11/25/2013   Performed by Eustace Moore, MD at [facility]  . LEFT BELOW  THE KNEE AMPUTATION Left 08/16/2015   Performed by Serafina Mitchell, MD at [facility]  2005   Done in [place], [place]. Chronic hepatitis with mild periportal inflammation, lobular unicellular necrosis and portal fibrosis. Grade 2, stage 1-2.  Marland Kitchen Lower Extremity Angiography Right 06/03/2016   Performed by Waynetta Sandy, MD at [facility]  . Lower Extremity Angiography Left 08/01/2015   Performed by Serafina Mitchell, MD at [facility]  . Peripheral Vascular Atherectomy Right 06/03/2016   Performed by Waynetta Sandy, MD at [facility]  . Peripheral Vascular Balloon Angioplasty Left 08/08/2015   Performed by Serafina Mitchell, MD at [facility]  . POLYPECTOMY N/A 09/03/2012   Performed by  Daneil Dolin, MD at [facility]  . REVISION RIGHT TRANSMETATARSAL AMPUTATION Right 07/10/2016   Performed by Waynetta Sandy, MD at [facility] Right 06/13/2016   Performed by Waynetta Sandy, MD at [facility]  . TONSILLECTOMY      Current Outpatient Medications on File Prior to Visit  Medication Sig Dispense Refill  . acetaminophen (TYLENOL) 325 MG tablet Take 650 mg by mouth every 4 (four) hours as needed for fever. For temp greater than 100.4    . allopurinol (ZYLOPRIM) 100 MG tablet Take 200 mg by mouth every evening.    . Amino Acids-Protein Hydrolys (FEEDING SUPPLEMENT, PRO-STAT SUGAR FREE 64,) LIQD Take 30 mLs by mouth 2 (two) times daily. 900 mL 0  . aspirin EC 81 MG tablet Take 81 mg by mouth daily.    . carvedilol (COREG) 3.125 MG tablet Take 1 tablet (3.125 mg total) by mouth 2 (two) times daily with a meal. 60 tablet 0  . cephALEXin (KEFLEX) 500 MG capsule Take 1 capsule (500 mg total) by mouth 3 (three) times daily. 42 capsule 0  . Cholecalciferol (VITAMIN D3) 2000 units TABS Take 1 tablet by mouth daily.    . clopidogrel (PLAVIX) 75 MG tablet Take 1 tablet (75 mg total) by mouth daily with breakfast. 30 tablet 0  . ferrous sulfate 325 (65 FE) MG tablet Take 325 mg by mouth 2 (two) times daily.     Marland Kitchen gabapentin (NEURONTIN) 600 MG tablet Take 1 tablet (600 mg total) by mouth 3 (three) times daily.    . insulin aspart (NOVOLOG) 100 UNIT/ML injection Inject 5 Units into the skin 3 (three) times daily after meals. (Patient taking differently: Inject 2-8 Units into the skin as directed. Inject 5 units 3 times daily after meals.  Inject 3 times daily before meals as per sliding scale: 150-200= 2 units, 201-250= 4 units, 251-300= 6 units, 301-350= 8 units, if >251 notify MD)    . insulin glargine (LANTUS) 100 UNIT/ML injection Inject 0.25 mLs (25 Units total) into the skin at bedtime. (Patient taking differently: Inject 20 Units into the skin at  bedtime. )    . lisinopril (PRINIVIL,ZESTRIL) 5 MG tablet Take 5 mg by mouth daily.     . magnesium hydroxide (MILK OF MAGNESIA) 400 MG/5ML suspension Take 30 mLs by mouth daily as needed for mild constipation.    . metFORMIN (GLUCOPHAGE) 500 MG tablet Take 500 mg by mouth daily.     Marland Kitchen morphine (MS CONTIN) 15 MG 12 hr tablet Take 1 tablet (15 mg total) by mouth every 12 (twelve) hours. 20 tablet 0  . Oxycodone HCl 10 MG TABS Take 1 tablet (  10 mg total) by mouth every 4 (four) hours as needed (pain). 30 tablet 0  . polyethylene glycol (MIRALAX / GLYCOLAX) packet Take 17 g by mouth 2 (two) times daily. (Patient taking differently: Take 17 g by mouth every 12 (twelve) hours as needed for mild constipation. ) 14 each 0  . Probiotic CAPS Take 1 capsule by mouth 2 (two) times daily.    Marland Kitchen senna-docusate (SENOKOT-S) 8.6-50 MG tablet Take 1 tablet by mouth 2 (two) times daily. (Patient taking differently: Take 1 tablet by mouth 2 (two) times daily. Hold for loose stools)    . simvastatin (ZOCOR) 20 MG tablet Take 20 mg by mouth daily.     . tamsulosin (FLOMAX) 0.4 MG CAPS capsule Take 1 capsule (0.4 mg total) by mouth daily. 30 capsule 0  . torsemide (DEMADEX) 20 MG tablet Take 20 mg by mouth daily.     . vitamin C (ASCORBIC ACID) 500 MG tablet Take 500 mg by mouth 2 (two) times daily.    . Vitamin D, Ergocalciferol, (DRISDOL) 50000 units CAPS capsule Take 50,000 Units by mouth every Friday. Takes every Friday     . [DISCONTINUED] ferrous fumarate (HEMOCYTE - 106 MG FE) 325 (106 FE) MG TABS tablet Take 1 tablet (106 mg of iron total) by mouth 2 (two) times daily. (Patient not taking: Reported on 04/01/2014) 60 each 0   No current facility-administered medications on file prior to visit.     Social History   Socioeconomic History  . Marital status: Divorced    Spouse name: Not on file  . Number of children: 1  . Years of education: Not on file  . Highest education level: Not on file  Social Needs  .  Financial resource strain: Not on file  . Food insecurity - worry: Not on file  . Food insecurity - inability: Not on file  . Transportation needs - medical: Not on file  . Transportation needs - non-medical: Not on file  Occupational History  . Occupation: disabled  Tobacco Use  . Smoking status: Former Smoker    Packs/day: 1.00    Years: 47.00    Pack years: 47.00    Types: Cigarettes    Last attempt to quit: 07/13/2015    Years since quitting: 1.4  . Smokeless tobacco: Never Used  Substance and Sexual Activity  . Alcohol use: No    Alcohol/week: 0.0 oz    Comment:  none 08/15/2015 "stopped drinking 3-4 years ago"  . Drug use: No    Comment: 08/15/2015 "last used cocaine 3-4 months ago"  . Sexual activity: No    Birth control/protection: Condom  Other Topics Concern  . Not on file  Social History Narrative  . Not on file    Family History  Problem Relation Age of Onset  . Breast cancer Mother   . Heart failure Mother   . Diabetes Father   . Hypertension Father   . Heart disease Father   . Hyperlipidemia Father   . Diabetes Sister   . Hypertension Sister   . Breast cancer Sister   . Colon cancer Neg Hx   . Liver disease Neg Hx     BP 137/88   Pulse 87   Temp 98.1 F (36.7 C)      Objective:   Physical Exam  Constitutional: He is oriented to person, place, and time. He appears well-developed and well-nourished.  HENT:  Head: Normocephalic and atraumatic.  Eyes: Conjunctivae and EOM are normal.  Pupils are equal, round, and reactive to light.  Neck: Normal range of motion. Neck supple.  Cardiovascular: Normal rate, regular rhythm and intact distal pulses.  Pulmonary/Chest: Effort normal.  Abdominal: Soft.  Neurological: He is alert and oriented to person, place, and time. He has normal reflexes. He displays normal reflexes. No cranial nerve deficit. He exhibits normal muscle tone. Coordination normal.  Skin: Skin is warm and dry.  Psychiatric: He has a normal  mood and affect. His behavior is normal. Judgment and thought content normal.     He has a large volume of records from the nursing home which I have reviewed.  Forms completed for the nursing home.  He is to have x-rays of the right shoulder before next visit.     Assessment & Plan:   Encounter Diagnosis  Name Primary?  . Chronic right shoulder pain Yes   PROCEDURE NOTE:  The patient request injection, verbal consent was obtained.  The right shoulder was prepped appropriately after time out was performed.   Sterile technique was observed and injection of 1 cc of Depo-Medrol 40 mg with several cc's of plain xylocaine. Anesthesia was provided by ethyl chloride and a 20-gauge needle was used to inject the shoulder area. A posterior approach was used.  The injection was tolerated well.  A band aid dressing was applied.  The patient was advised to apply ice later today and tomorrow to the injection sight as needed.  Return in two weeks.  Call if any problem.  Precautions discussed.   Electronically Signed Sanjuana Kava, MD 11/20/201810:11 AM

## 2017-01-06 ENCOUNTER — Other Ambulatory Visit: Payer: Self-pay | Admitting: Orthopedic Surgery

## 2017-01-06 DIAGNOSIS — Z8781 Personal history of (healed) traumatic fracture: Secondary | ICD-10-CM

## 2017-01-07 ENCOUNTER — Ambulatory Visit (HOSPITAL_COMMUNITY)
Admission: RE | Admit: 2017-01-07 | Discharge: 2017-01-07 | Disposition: A | Discharge status: Home / Self Care | Payer: Medicaid Other | Source: Ambulatory Visit | Attending: Orthopaedic Surgery | Admitting: Orthopaedic Surgery

## 2017-01-07 DIAGNOSIS — M19011 Primary osteoarthritis, right shoulder: Secondary | ICD-10-CM | POA: Diagnosis not present

## 2017-01-07 DIAGNOSIS — G8929 Other chronic pain: Secondary | ICD-10-CM | POA: Insufficient documentation

## 2017-01-07 DIAGNOSIS — M25511 Pain in right shoulder: Secondary | ICD-10-CM | POA: Diagnosis present

## 2017-01-14 ENCOUNTER — Encounter: Payer: Self-pay | Admitting: Orthopaedic Surgery

## 2017-01-14 ENCOUNTER — Ambulatory Visit: Payer: Medicaid Other | Admitting: Orthopaedic Surgery

## 2017-01-14 VITALS — BP 121/76 | HR 90 | Temp 99.0°F | Wt 205.0 lb

## 2017-01-14 DIAGNOSIS — M25511 Pain in right shoulder: Secondary | ICD-10-CM

## 2017-01-14 DIAGNOSIS — G8929 Other chronic pain: Secondary | ICD-10-CM

## 2017-01-14 NOTE — Progress Notes (Signed)
PROCEDURE NOTE:  The patient request injection, verbal consent was obtained.  The right shoulder was prepped appropriately after time out was performed.   Sterile technique was observed and injection of 1 cc of Depo-Medrol 40 mg with several cc's of plain xylocaine. Anesthesia was provided by ethyl chloride and a 20-gauge needle was used to inject the shoulder area. A posterior approach was used.  The injection was tolerated well.  A band aid dressing was applied.  The patient was advised to apply ice later today and tomorrow to the injection sight as needed.  Return in one month.  Consider MRI if not improved.  Forms for nursing home completed.  Begin PT in the nursing home.  Electronically Signed Sanjuana Kava, MD 12/4/201810:41 AM

## 2017-02-13 ENCOUNTER — Ambulatory Visit: Payer: Medicaid Other | Admitting: Orthopaedic Surgery

## 2017-02-13 ENCOUNTER — Encounter: Payer: Self-pay | Admitting: Orthopaedic Surgery

## 2017-02-13 DIAGNOSIS — G8929 Other chronic pain: Secondary | ICD-10-CM

## 2017-02-13 DIAGNOSIS — M75102 Unspecified rotator cuff tear or rupture of left shoulder, not specified as traumatic: Secondary | ICD-10-CM

## 2017-02-13 DIAGNOSIS — M25511 Pain in right shoulder: Secondary | ICD-10-CM

## 2017-02-13 NOTE — Progress Notes (Signed)
PROCEDURE NOTE:  The patient request injection, verbal consent was obtained.  The right shoulder was prepped appropriately after time out was performed.   Sterile technique was observed and injection of 1 cc of Depo-Medrol 40 mg with several cc's of plain xylocaine. Anesthesia was provided by ethyl chloride and a 20-gauge needle was used to inject the shoulder area. A posterior approach was used.  The injection was tolerated well.  A band aid dressing was applied.  The patient was advised to apply ice later today and tomorrow to the injection sight as needed.  He has continued pain.  I will get MRI of the shoulder on the right.  He has bilateral below knee amputations and depends on his arms to navigate with wheelchair in the nursing home.  Return after MRI.  Electronically Signed Sanjuana Kava, MD 1/3/20199:30 AM

## 2017-02-19 ENCOUNTER — Ambulatory Visit (HOSPITAL_COMMUNITY)
Admission: RE | Admit: 2017-02-19 | Discharge: 2017-02-19 | Disposition: A | Discharge status: Home / Self Care | Payer: Medicaid Other | Source: Ambulatory Visit | Attending: Orthopaedic Surgery | Admitting: Orthopaedic Surgery

## 2017-02-19 DIAGNOSIS — M25511 Pain in right shoulder: Secondary | ICD-10-CM | POA: Diagnosis present

## 2017-02-19 DIAGNOSIS — G8929 Other chronic pain: Secondary | ICD-10-CM | POA: Diagnosis not present

## 2017-02-25 ENCOUNTER — Ambulatory Visit (INDEPENDENT_AMBULATORY_CARE_PROVIDER_SITE_OTHER): Payer: Medicaid Other | Admitting: Orthopaedic Surgery

## 2017-02-25 ENCOUNTER — Encounter: Payer: Self-pay | Admitting: Orthopaedic Surgery

## 2017-02-25 VITALS — BP 121/70 | HR 96 | Resp 16

## 2017-02-25 DIAGNOSIS — G8929 Other chronic pain: Secondary | ICD-10-CM | POA: Diagnosis not present

## 2017-02-25 DIAGNOSIS — M25511 Pain in right shoulder: Secondary | ICD-10-CM

## 2017-02-25 NOTE — Progress Notes (Signed)
Patient Cory Weber [name], male DOB:11-04-1950, 67 y.o. BLT:[id number]  Chief Complaint  Patient presents with  . Shoulder Pain    right   . Results    review MRI     HPI  Cory Weber is a 67 y.o. male who has continued right shoulder pain.  He had a MRI which showed: IMPRESSION: 1. Supraspinatus tendinosis with partial articular surface and intrasubstance insertional tearing. No evidence of full-thickness rotator cuff tear, tendon retraction or focal muscular atrophy. 2. Infraspinatus and subscapularis tendinosis. 3. Joint capsular thickening in the axillary recess as can be seen with adhesive capsulitis. 4. Question old AC joint injury with moderate acromioclavicular degenerative changes.  I have explained the findings to him.  I would not recommend any surgery at this point.  I want him to continue the PT at the nursing home. HPI  There is no height or weight on file to calculate BMI.  ROS  Review of Systems  HENT: Negative for congestion.   Respiratory: Positive for shortness of breath. Negative for cough.   Cardiovascular: Negative for chest pain and leg swelling.  Endocrine: Positive for cold intolerance.  Musculoskeletal: Positive for arthralgias and gait problem.  Allergic/Immunologic: Positive for environmental allergies.  All other systems reviewed and are negative.   Past Medical History:  Diagnosis Date  . Anemia   . Asthma   . BPH (benign prostatic hyperplasia)   . Cellulitis and abscess of foot 07/2015  . Chronic diastolic heart failure ([place])    per Nursing facility  . Chronic kidney disease    CKD stage 3  . CVA (cerebral vascular accident) ([place]) 2015   denies residual on 08/15/2015  . GERD (gastroesophageal reflux disease)   . Hepatitis C    states he was diagnosed in 2007 or 2007 while living in [place], [place]  . Hepatitis C   . Hyperlipidemia   . Hypertension   . Ischemia of foot 05/31/2016  . Osteomyelitis ([place])    per Nursing  Facility  . Peripheral neuropathy   . Pneumonia X 1  . PVD (peripheral vascular disease) ([place])   . Type II diabetes mellitus ([place])   . Wet gangrene ([place]) 06/10/2016    Past Surgical History:  Procedure Laterality Date  . ABDOMINAL AORTOGRAM N/A 06/03/2016   Procedure: Abdominal Aortogram;  Surgeon: Waynetta Sandy, MD;  Location: [facility];  Service: Cardiovascular;  Laterality: N/A;  . AMPUTATION Left 08/16/2015   Procedure: LEFT BELOW THE KNEE AMPUTATION ;  Surgeon: Serafina Mitchell, MD;  Location: [facility];  Service: Vascular;  Laterality: Left;  . AMPUTATION Right 08/22/2016   Procedure: AMPUTATION BELOW KNEE-RIGHT;  Surgeon: Waynetta Sandy, MD;  Location: [facility];  Service: Vascular;  Laterality: Right;  . BACK SURGERY    . BELOW KNEE LEG AMPUTATION Left 08/16/2015  . BIOPSY N/A 09/03/2012   Procedure: BIOPSY;  Surgeon: Daneil Dolin, MD;  Location: [facility];  Service: Endoscopy;  Laterality: N/A;  gastric and gastric mucosa  . BIOPSY N/A 12/03/2012   Procedure: BIOPSY;  Surgeon: Daneil Dolin, MD;  Location: [facility];  Service: Endoscopy;  Laterality: N/A;  . COLONOSCOPY WITH PROPOFOL N/A 09/03/2012   AQT:MAUQJFH polyp-removed as outlined above. Prominent internal hemorrhoids. Tubular adenoma  . ESOPHAGOGASTRODUODENOSCOPY (EGD) WITH PROPOFOL N/A 09/03/2012   LKT:GYBWLS hernia. Gastric diverticulum. Gastric ulcers with associated erosions. Duodenal erosions. Status post gastric biopsy. H.PYLORI gastritis   . ESOPHAGOGASTRODUODENOSCOPY (EGD) WITH PROPOFOL N/A 12/03/2012   Dr.  Rourk: gastric diverticulum, gastric erosions and scar. Previously noted gastric ulcer completed healed. Biopsy without H.pylori.   . ESOPHAGOGASTRODUODENOSCOPY (EGD) WITH PROPOFOL N/A 01/23/2016   Procedure: ESOPHAGOGASTRODUODENOSCOPY (EGD) WITH PROPOFOL;  Surgeon: Doran Stabler, MD;  Location: [facility];  Service: Gastroenterology;  Laterality: N/A;  . I&D EXTREMITY Right 07/11/2016    Procedure: DELAY PRIMARY CLOSURE FOOT AMPUTATION;  Surgeon: Edrick Kins, DPM;  Location: [facility];  Service: Podiatry;  Laterality: Right;  . LIVER BIOPSY  2005   Done in [place], [place]. Chronic hepatitis with mild periportal inflammation, lobular unicellular necrosis and portal fibrosis. Grade 2, stage 1-2.  . LOWER EXTREMITY ANGIOGRAPHY Right 06/03/2016   Procedure: Lower Extremity Angiography;  Surgeon: Waynetta Sandy, MD;  Location: [facility];  Service: Cardiovascular;  Laterality: Right;  . MAXIMUM ACCESS (MAS)POSTERIOR LUMBAR INTERBODY FUSION (PLIF) 1 LEVEL N/A 11/25/2013   Procedure: FOR MAXIMUM ACCESS (MAS) POSTERIOR LUMBAR INTERBODY FUSION (PLIF) 1 LEVEL;  Surgeon: Eustace Moore, MD;  Location: [facility];  Service: Neurosurgery;  Laterality: N/A;  FOR MAXIMUM ACCESS (MAS) POSTERIOR LUMBAR INTERBODY FUSION (PLIF) 1 LEVEL LUMBAR 3-4  . PERIPHERAL VASCULAR ATHERECTOMY Right 06/03/2016   Procedure: Peripheral Vascular Atherectomy;  Surgeon: Waynetta Sandy, MD;  Location: [facility];  Service: Cardiovascular;  Laterality: Right;  SFA WITH STENT  . PERIPHERAL VASCULAR CATHETERIZATION Left 08/01/2015   Procedure: Lower Extremity Angiography;  Surgeon: Serafina Mitchell, MD;  Location: [facility];  Service: Cardiovascular;  Laterality: Left;  . PERIPHERAL VASCULAR CATHETERIZATION N/A 08/01/2015   Procedure: Abdominal Aortogram;  Surgeon: Serafina Mitchell, MD;  Location: [facility];  Service: Cardiovascular;  Laterality: N/A;  . PERIPHERAL VASCULAR CATHETERIZATION N/A 08/08/2015   Procedure: Abdominal Aortogram w/Lower Extremity;  Surgeon: Serafina Mitchell, MD;  Location: [facility];  Service: Cardiovascular;  Laterality: N/A;  . PERIPHERAL VASCULAR CATHETERIZATION Left 08/08/2015   Procedure: Peripheral Vascular Balloon Angioplasty;  Surgeon: Serafina Mitchell, MD;  Location: [facility];  Service: Cardiovascular;  Laterality: Left;  left  popiteal artery, left peronealtrunk, left post tibial  . POLYPECTOMY N/A 09/03/2012   Procedure: POLYPECTOMY;  Surgeon: Daneil Dolin, MD;  Location: [facility];  Service: Endoscopy;  Laterality: N/A;  cecal polyp  . TONSILLECTOMY    . TRANSMETATARSAL AMPUTATION Right 06/13/2016   Procedure: RIGHT TRANSMETATARSAL AMPUTATION;  Surgeon: Waynetta Sandy, MD;  Location: [facility];  Service: Vascular;  Laterality: Right;  . TRANSMETATARSAL AMPUTATION Right 07/10/2016   Procedure: REVISION RIGHT TRANSMETATARSAL AMPUTATION;  Surgeon: Waynetta Sandy, MD;  [facility];  Service: Vascular;  Laterality: Right;    Family History  Problem Relation Age of Onset  . Breast cancer Mother   . Heart failure Mother   . Diabetes Father   . Hypertension Father   . Heart disease Father   . Hyperlipidemia Father   . Diabetes Sister   . Hypertension Sister   . Breast cancer Sister   . Colon cancer Neg Hx   . Liver disease Neg Hx     Social History Social History   Tobacco Use  . Smoking status: Former Smoker    Packs/day: 1.00    Years: 47.00    Pack years: 47.00    Types: Cigarettes    Last attempt to quit: 07/13/2015    Years since quitting: 1.6  . Smokeless tobacco: Never Used  Substance Use Topics  . Alcohol use: No  Alcohol/week: 0.0 oz    Comment:  none 08/15/2015 "stopped drinking 3-4 years ago"  . Drug use: No    Comment: 08/15/2015 "last used cocaine 3-4 months ago"    Allergies  Allergen Reactions  . No Known Allergies     Current Outpatient Medications  Medication Sig Dispense Refill  . acetaminophen (TYLENOL) 325 MG tablet Take 650 mg by mouth every 4 (four) hours as needed for fever. For temp greater than 100.4    . allopurinol (ZYLOPRIM) 100 MG tablet Take 200 mg by mouth every evening.    . Amino Acids-Protein Hydrolys (FEEDING SUPPLEMENT, PRO-STAT SUGAR FREE 64,) LIQD Take 30 mLs by mouth 2 (two) times daily. 900 mL 0  . aspirin EC 81 MG tablet Take 81 mg by  mouth daily.    . carvedilol (COREG) 3.125 MG tablet Take 1 tablet (3.125 mg total) by mouth 2 (two) times daily with a meal. 60 tablet 0  . cephALEXin (KEFLEX) 500 MG capsule Take 1 capsule (500 mg total) by mouth 3 (three) times daily. 42 capsule 0  . Cholecalciferol (VITAMIN D3) 2000 units TABS Take 1 tablet by mouth daily.    . clopidogrel (PLAVIX) 75 MG tablet Take 1 tablet (75 mg total) by mouth daily with breakfast. 30 tablet 0  . ferrous sulfate 325 (65 FE) MG tablet Take 325 mg by mouth 2 (two) times daily.     Marland Kitchen gabapentin (NEURONTIN) 600 MG tablet Take 1 tablet (600 mg total) by mouth 3 (three) times daily.    . insulin aspart (NOVOLOG) 100 UNIT/ML injection Inject 5 Units into the skin 3 (three) times daily after meals. (Patient taking differently: Inject 2-8 Units into the skin as directed. Inject 5 units 3 times daily after meals.  Inject 3 times daily before meals as per sliding scale: 150-200= 2 units, 201-250= 4 units, 251-300= 6 units, 301-350= 8 units, if >251 notify MD)    . insulin glargine (LANTUS) 100 UNIT/ML injection Inject 0.25 mLs (25 Units total) into the skin at bedtime. (Patient taking differently: Inject 20 Units into the skin at bedtime. )    . lisinopril (PRINIVIL,ZESTRIL) 5 MG tablet Take 5 mg by mouth daily.     . magnesium hydroxide (MILK OF MAGNESIA) 400 MG/5ML suspension Take 30 mLs by mouth daily as needed for mild constipation.    . metFORMIN (GLUCOPHAGE) 500 MG tablet Take 500 mg by mouth daily.     Marland Kitchen morphine (MS CONTIN) 15 MG 12 hr tablet Take 1 tablet (15 mg total) by mouth every 12 (twelve) hours. 20 tablet 0  . Oxycodone HCl 10 MG TABS Take 1 tablet (10 mg total) by mouth every 4 (four) hours as needed (pain). 30 tablet 0  . polyethylene glycol (MIRALAX / GLYCOLAX) packet Take 17 g by mouth 2 (two) times daily. (Patient taking differently: Take 17 g by mouth every 12 (twelve) hours as needed for mild constipation. ) 14 each 0  . Probiotic CAPS Take 1  capsule by mouth 2 (two) times daily.    Marland Kitchen senna-docusate (SENOKOT-S) 8.6-50 MG tablet Take 1 tablet by mouth 2 (two) times daily. (Patient taking differently: Take 1 tablet by mouth 2 (two) times daily. Hold for loose stools)    . simvastatin (ZOCOR) 20 MG tablet Take 20 mg by mouth daily.     . tamsulosin (FLOMAX) 0.4 MG CAPS capsule Take 1 capsule (0.4 mg total) by mouth daily. 30 capsule 0  . torsemide (DEMADEX) 20 MG  tablet Take 20 mg by mouth daily.     . vitamin C (ASCORBIC ACID) 500 MG tablet Take 500 mg by mouth 2 (two) times daily.    . Vitamin D, Ergocalciferol, (DRISDOL) 50000 units CAPS capsule Take 50,000 Units by mouth every Friday. Takes every Friday      No current facility-administered medications for this visit.      Physical Exam  Blood pressure 121/70, pulse 96, resp. rate 16.  Constitutional: overall normal hygiene, normal nutrition, well developed, normal grooming, normal body habitus. Assistive device:wheelchair  Musculoskeletal: gait and station Limp he has bilateral below the knee amputations and does not have prosthesis with him today, muscle tone and strength are normal, no tremors or atrophy is present.  .  Neurological: coordination overall normal.  Deep tendon reflex/nerve stretch intact.  Sensation normal.  Cranial nerves II-XII intact.   Skin:   Normal overall scars from leg amputations, no lesions, ulcers or rashes. No psoriasis.  Psychiatric: Alert and oriented x 3.  Recent memory intact, remote memory unclear.  Normal mood and affect. Well groomed.  Good eye contact.  Cardiovascular: overall no swelling, no varicosities.  Lymphatic: palpation is normal.  Right shoulder has full motion but pain in the extremes.  NV intact.  Grips normal.  All other systems reviewed and are negative   The patient has been educated about the nature of the problem(s) and counseled on treatment options.  The patient appeared to understand what I have discussed and is  in agreement with it.  Encounter Diagnosis  Name Primary?  . Chronic right shoulder pain Yes    PLAN Call if any problems.  Precautions discussed.  Continue current medications.   Return to clinic 3 weeks   Forms for the nursing home completed.  Continue PT at the nursing home.  Electronically Signed Sanjuana Kava, MD 1/15/20191:43 PM

## 2017-03-18 ENCOUNTER — Ambulatory Visit: Payer: Medicaid Other | Admitting: Orthopaedic Surgery

## 2017-03-18 ENCOUNTER — Encounter: Payer: Self-pay | Admitting: Orthopaedic Surgery

## 2017-03-18 VITALS — BP 130/81 | HR 89

## 2017-03-18 DIAGNOSIS — M25511 Pain in right shoulder: Secondary | ICD-10-CM

## 2017-03-18 DIAGNOSIS — G8929 Other chronic pain: Secondary | ICD-10-CM

## 2017-03-18 DIAGNOSIS — M542 Cervicalgia: Secondary | ICD-10-CM

## 2017-03-18 DIAGNOSIS — M75102 Unspecified rotator cuff tear or rupture of left shoulder, not specified as traumatic: Secondary | ICD-10-CM

## 2017-03-18 NOTE — Progress Notes (Signed)
Patient JO:ACZYSAYT Cory Weber, male DOB:1950/02/13, 67 y.o. KZS:[id number]  Chief Complaint  Patient presents with  . Follow-up    RIGHT SHOULDER    HPI  Cory Weber is a 67 y.o. male who has continued pain of the right shoulder.  He had MRI showing no rotator cuff tear.  He has bilateral below knee amputations and cannot walk.  He uses a manual wheelchair.  I will increase his pain medicine at his rest home.  Rx written on orders for the facility.   HPI  There is no height or weight on file to calculate BMI.  ROS  Review of Systems  HENT: Negative for congestion.   Respiratory: Positive for shortness of breath. Negative for cough.   Cardiovascular: Negative for chest pain and leg swelling.  Endocrine: Positive for cold intolerance.  Musculoskeletal: Positive for arthralgias and gait problem.  Allergic/Immunologic: Positive for environmental allergies.  All other systems reviewed and are negative.   Past Medical History:  Diagnosis Date  . Anemia   . Asthma   . BPH (benign prostatic hyperplasia)   . Cellulitis and abscess of foot 07/2015  . Chronic diastolic heart failure ([place])    per Nursing facility  . Chronic kidney disease    CKD stage 3  . CVA (cerebral vascular accident) ([place]) 2015   denies residual on 08/15/2015  . GERD (gastroesophageal reflux disease)   . Hepatitis C    states he was diagnosed in 2007 or 2007 while living in [place], [place]  . Hepatitis C   . Hyperlipidemia   . Hypertension   . Ischemia of foot 05/31/2016  . Osteomyelitis ([place])    per Nursing Facility  . Peripheral neuropathy   . Pneumonia X 1  . PVD (peripheral vascular disease) ([place])   . Type II diabetes mellitus ([place])   . Wet gangrene ([place]) 06/10/2016    Past Surgical History:  Procedure Laterality Date  . ABDOMINAL AORTOGRAM N/A 06/03/2016   Procedure: Abdominal Aortogram;  Surgeon: Waynetta Sandy, MD;  Location: [facility];  Service: Cardiovascular;  Laterality:  N/A;  . AMPUTATION Left 08/16/2015   Procedure: LEFT BELOW THE KNEE AMPUTATION ;  Surgeon: Serafina Mitchell, MD;  Location: [facility];  Service: Vascular;  Laterality: Left;  . AMPUTATION Right 08/22/2016   Procedure: AMPUTATION BELOW KNEE-RIGHT;  Surgeon: Waynetta Sandy, MD;  Location: [facility];  Service: Vascular;  Laterality: Right;  . BACK SURGERY    . BELOW KNEE LEG AMPUTATION Left 08/16/2015  . BIOPSY N/A 09/03/2012   Procedure: BIOPSY;  Surgeon: Daneil Dolin, MD;  Location: [facility];  Service: Endoscopy;  Laterality: N/A;  gastric and gastric mucosa  . BIOPSY N/A 12/03/2012   Procedure: BIOPSY;  Surgeon: Daneil Dolin, MD;  Location: [facility];  Service: Endoscopy;  Laterality: N/A;  . COLONOSCOPY WITH PROPOFOL N/A 09/03/2012   DDU:KGURKYH polyp-removed as outlined above. Prominent internal hemorrhoids. Tubular adenoma  . ESOPHAGOGASTRODUODENOSCOPY (EGD) WITH PROPOFOL N/A 09/03/2012   CWC:BJSEGB hernia. Gastric diverticulum. Gastric ulcers with associated erosions. Duodenal erosions. Status post gastric biopsy. H.PYLORI gastritis   . ESOPHAGOGASTRODUODENOSCOPY (EGD) WITH PROPOFOL N/A 12/03/2012   Dr. Gala Romney: gastric diverticulum, gastric erosions and scar. Previously noted gastric ulcer completed healed. Biopsy without H.pylori.   . ESOPHAGOGASTRODUODENOSCOPY (EGD) WITH PROPOFOL N/A 01/23/2016   Procedure: ESOPHAGOGASTRODUODENOSCOPY (EGD) WITH PROPOFOL;  Surgeon: Doran Stabler, MD;  [facility]: [facility];  Service: Gastroenterology;  Laterality: N/A;  . I&D EXTREMITY Right 07/11/2016  Procedure: DELAY PRIMARY CLOSURE FOOT AMPUTATION;  Surgeon: Edrick Kins, DPM;  Location: [facility];  Service: Podiatry;  Laterality: Right;  . LIVER BIOPSY  2005   Done in [place], [place]. Chronic hepatitis with mild periportal inflammation, lobular unicellular necrosis and portal fibrosis. Grade 2, stage 1-2.  . LOWER EXTREMITY ANGIOGRAPHY Right 06/03/2016   Procedure: Lower Extremity Angiography;  Surgeon:  Waynetta Sandy, MD;  Location: [facility];  Service: Cardiovascular;  Laterality: Right;  . MAXIMUM ACCESS (MAS)POSTERIOR LUMBAR INTERBODY FUSION (PLIF) 1 LEVEL N/A 11/25/2013   Procedure: FOR MAXIMUM ACCESS (MAS) POSTERIOR LUMBAR INTERBODY FUSION (PLIF) 1 LEVEL;  Surgeon: Eustace Moore, MD;  Location: [facility];  Service: Neurosurgery;  Laterality: N/A;  FOR MAXIMUM ACCESS (MAS) POSTERIOR LUMBAR INTERBODY FUSION (PLIF) 1 LEVEL LUMBAR 3-4  . PERIPHERAL VASCULAR ATHERECTOMY Right 06/03/2016   Procedure: Peripheral Vascular Atherectomy;  Surgeon: Waynetta Sandy, MD;  Location: [facility];  Service: Cardiovascular;  Laterality: Right;  SFA WITH STENT  . PERIPHERAL VASCULAR CATHETERIZATION Left 08/01/2015   Procedure: Lower Extremity Angiography;  Surgeon: Serafina Mitchell, MD;  Location: [facility];  Service: Cardiovascular;  Laterality: Left;  . PERIPHERAL VASCULAR CATHETERIZATION N/A 08/01/2015   Procedure: Abdominal Aortogram;  Surgeon: Serafina Mitchell, MD;  Location: [facility];  Service: Cardiovascular;  Laterality: N/A;  . PERIPHERAL VASCULAR CATHETERIZATION N/A 08/08/2015   Procedure: Abdominal Aortogram w/Lower Extremity;  Surgeon: Serafina Mitchell, MD;  Location: [facility];  Service: Cardiovascular;  Laterality: N/A;  . PERIPHERAL VASCULAR CATHETERIZATION Left 08/08/2015   Procedure: Peripheral Vascular Balloon Angioplasty;  Surgeon: Serafina Mitchell, MD;  Location: [facility];  Service: Cardiovascular;  Laterality: Left;  left popiteal artery, left peronealtrunk, left post tibial  . POLYPECTOMY N/A 09/03/2012   Procedure: POLYPECTOMY;  Surgeon: Daneil Dolin, MD;  [facility];  Service: Endoscopy;  Laterality: N/A;  cecal polyp  . TONSILLECTOMY    . TRANSMETATARSAL AMPUTATION Right 06/13/2016   Procedure: RIGHT TRANSMETATARSAL AMPUTATION;  Surgeon: Waynetta Sandy, MD;  Location: [facility];  Service: Vascular;   Laterality: Right;  . TRANSMETATARSAL AMPUTATION Right 07/10/2016   Procedure: REVISION RIGHT TRANSMETATARSAL AMPUTATION;  Surgeon: Waynetta Sandy, MD;  Location: [facility];  Service: Vascular;  Laterality: Right;    Family History  Problem Relation Age of Onset  . Breast cancer Mother   . Heart failure Mother   . Diabetes Father   . Hypertension Father   . Heart disease Father   . Hyperlipidemia Father   . Diabetes Sister   . Hypertension Sister   . Breast cancer Sister   . Colon cancer Neg Hx   . Liver disease Neg Hx     Social History Social History   Tobacco Use  . Smoking status: Former Smoker    Packs/day: 1.00    Years: 47.00    Pack years: 47.00    Types: Cigarettes    Last attempt to quit: 07/13/2015    Years since quitting: 1.6  . Smokeless tobacco: Never Used  Substance Use Topics  . Alcohol use: No    Alcohol/week: 0.0 oz    Comment:  none 08/15/2015 "stopped drinking 3-4 years ago"  . Drug use: No    Comment: 08/15/2015 "last used cocaine 3-4 months ago"    Allergies  Allergen Reactions  . No Known Allergies     Current Outpatient Medications  Medication Sig Dispense Refill  .  acetaminophen (TYLENOL) 325 MG tablet Take 650 mg by mouth every 4 (four) hours as needed for fever. For temp greater than 100.4    . allopurinol (ZYLOPRIM) 100 MG tablet Take 200 mg by mouth every evening.    . Amino Acids-Protein Hydrolys (FEEDING SUPPLEMENT, PRO-STAT SUGAR FREE 64,) LIQD Take 30 mLs by mouth 2 (two) times daily. 900 mL 0  . aspirin EC 81 MG tablet Take 81 mg by mouth daily.    . carvedilol (COREG) 3.125 MG tablet Take 1 tablet (3.125 mg total) by mouth 2 (two) times daily with a meal. 60 tablet 0  . cephALEXin (KEFLEX) 500 MG capsule Take 1 capsule (500 mg total) by mouth 3 (three) times daily. 42 capsule 0  . Cholecalciferol (VITAMIN D3) 2000 units TABS Take 1 tablet by mouth daily.    . clopidogrel (PLAVIX) 75 MG tablet Take 1 tablet (75 mg total) by mouth  daily with breakfast. 30 tablet 0  . ferrous sulfate 325 (65 FE) MG tablet Take 325 mg by mouth 2 (two) times daily.     Marland Kitchen gabapentin (NEURONTIN) 600 MG tablet Take 1 tablet (600 mg total) by mouth 3 (three) times daily.    . insulin aspart (NOVOLOG) 100 UNIT/ML injection Inject 5 Units into the skin 3 (three) times daily after meals. (Patient taking differently: Inject 2-8 Units into the skin as directed. Inject 5 units 3 times daily after meals.  Inject 3 times daily before meals as per sliding scale: 150-200= 2 units, 201-250= 4 units, 251-300= 6 units, 301-350= 8 units, if >251 notify MD)    . insulin glargine (LANTUS) 100 UNIT/ML injection Inject 0.25 mLs (25 Units total) into the skin at bedtime. (Patient taking differently: Inject 20 Units into the skin at bedtime. )    . lisinopril (PRINIVIL,ZESTRIL) 5 MG tablet Take 5 mg by mouth daily.     . magnesium hydroxide (MILK OF MAGNESIA) 400 MG/5ML suspension Take 30 mLs by mouth daily as needed for mild constipation.    . metFORMIN (GLUCOPHAGE) 500 MG tablet Take 500 mg by mouth daily.     Marland Kitchen morphine (MS CONTIN) 15 MG 12 hr tablet Take 1 tablet (15 mg total) by mouth every 12 (twelve) hours. 20 tablet 0  . Oxycodone HCl 10 MG TABS Take 1 tablet (10 mg total) by mouth every 4 (four) hours as needed (pain). 30 tablet 0  . polyethylene glycol (MIRALAX / GLYCOLAX) packet Take 17 g by mouth 2 (two) times daily. (Patient taking differently: Take 17 g by mouth every 12 (twelve) hours as needed for mild constipation. ) 14 each 0  . Probiotic CAPS Take 1 capsule by mouth 2 (two) times daily.    Marland Kitchen senna-docusate (SENOKOT-S) 8.6-50 MG tablet Take 1 tablet by mouth 2 (two) times daily. (Patient taking differently: Take 1 tablet by mouth 2 (two) times daily. Hold for loose stools)    . simvastatin (ZOCOR) 20 MG tablet Take 20 mg by mouth daily.     . tamsulosin (FLOMAX) 0.4 MG CAPS capsule Take 1 capsule (0.4 mg total) by mouth daily. 30 capsule 0  . torsemide  (DEMADEX) 20 MG tablet Take 20 mg by mouth daily.     . vitamin C (ASCORBIC ACID) 500 MG tablet Take 500 mg by mouth 2 (two) times daily.    . Vitamin D, Ergocalciferol, (DRISDOL) 50000 units CAPS capsule Take 50,000 Units by mouth every Friday. Takes every Friday      No current  facility-administered medications for this visit.      Physical Exam  Blood pressure 130/81, pulse 89.  Constitutional: overall normal hygiene, normal nutrition, well developed, normal grooming, normal body habitus. Assistive device:wheelchair  Musculoskeletal: gait and station Limp he has bilateral below knee amputations and cannot stand, muscle tone and strength are normal, no tremors or atrophy is present.  .  Neurological: coordination overall normal.  Deep tendon reflex/nerve stretch intact.  Sensation normal.  Cranial nerves II-XII intact.   Skin:   Normal overall below knee amputationscars,  No lesions, ulcers or rashes. No psoriasis.  Psychiatric: Alert and oriented x 3.  Recent memory intact, remote memory unclear.  Normal mood and affect. Well groomed.  Good eye contact.  Cardiovascular: overall no swelling, no varicosities, no edema bilaterally above the bilateral amputations lower extremity., normal temperatures of the legs and arms, no clubbing, cyanosis and good capillary refill of upper extremity.  Lymphatic: palpation is normal.  Right shoulder has full motion and pain in the extremes.  NV intact.  He has no crepitus.  All other systems reviewed and are negative   The patient has been educated about the nature of the problem(s) and counseled on treatment options.  The patient appeared to understand what I have discussed and is in agreement with it.  Encounter Diagnoses  Name Primary?  . Cervicalgia Yes  . Chronic right shoulder pain   . Rotator cuff syndrome, left     PLAN Call if any problems.  Precautions discussed.  Continue current medications.   Return to clinic 3  weeks   Electronically Signed Sanjuana Kava, MD 2/5/20193:58 PM

## 2017-03-19 ENCOUNTER — Telehealth: Payer: Self-pay | Admitting: Orthopaedic Surgery

## 2017-03-19 NOTE — Telephone Encounter (Signed)
Cory Weber called from [facility] stating that she received an order for patient's pain medication yesterday but that it has be written on prescription pad and faxed to them at [facility].  Would you please do this for him so that I may fax it?  Thanks

## 2017-03-19 NOTE — Telephone Encounter (Signed)
No.  We can send electronically if they are within the system of the Epic computer.  The written order on their order sheet takes the place of a written Rx.  It is a written Rx.

## 2017-03-20 ENCOUNTER — Telehealth: Payer: Self-pay | Admitting: Radiology

## 2017-03-20 MED ORDER — OXYCODONE-ACETAMINOPHEN 7.5-325 MG PO TABS: ORAL_TABLET | ORAL | 0 refills | Status: DC

## 2017-03-20 NOTE — Telephone Encounter (Signed)
[facility] uses [facility] wants rx for Percocet sent there

## 2017-04-08 ENCOUNTER — Ambulatory Visit: Payer: Medicaid Other | Admitting: Orthopaedic Surgery

## 2017-04-09 ENCOUNTER — Ambulatory Visit (INDEPENDENT_AMBULATORY_CARE_PROVIDER_SITE_OTHER): Payer: Medicaid Other | Admitting: Orthopaedic Surgery

## 2017-04-09 ENCOUNTER — Encounter: Payer: Self-pay | Admitting: Orthopaedic Surgery

## 2017-04-09 VITALS — BP 124/70 | HR 80 | Temp 98.0°F

## 2017-04-09 DIAGNOSIS — G8929 Other chronic pain: Secondary | ICD-10-CM

## 2017-04-09 DIAGNOSIS — M25511 Pain in right shoulder: Secondary | ICD-10-CM | POA: Diagnosis not present

## 2017-04-09 MED ORDER — OXYCODONE-ACETAMINOPHEN 7.5-325 MG PO TABS: ORAL_TABLET | ORAL | 0 refills | Status: DC

## 2017-04-09 NOTE — Progress Notes (Signed)
PROCEDURE NOTE:  The patient request injection, verbal consent was obtained.  The right shoulder was prepped appropriately after time out was performed.   Sterile technique was observed and injection of 1 cc of Depo-Medrol 40 mg with several cc's of plain xylocaine. Anesthesia was provided by ethyl chloride and a 20-gauge needle was used to inject the shoulder area. A posterior approach was used.  The injection was tolerated well.  A band aid dressing was applied.  The patient was advised to apply ice later today and tomorrow to the injection sight as needed.  I have reviewed the [facility] web site prior to prescribing narcotic medicine for this patient.  Electronically Signed Sanjuana Kava, MD 2/27/20199:49 AM

## 2017-05-21 ENCOUNTER — Encounter: Payer: Self-pay | Admitting: Orthopedic Surgery

## 2017-05-21 ENCOUNTER — Ambulatory Visit: Payer: Medicaid Other | Admitting: Orthopedic Surgery

## 2017-05-21 VITALS — BP 118/80 | HR 86 | Resp 16

## 2017-05-21 DIAGNOSIS — M25511 Pain in right shoulder: Secondary | ICD-10-CM | POA: Diagnosis not present

## 2017-05-21 DIAGNOSIS — G8929 Other chronic pain: Secondary | ICD-10-CM | POA: Diagnosis not present

## 2017-05-21 MED ORDER — OXYCODONE-ACETAMINOPHEN 7.5-325 MG PO TABS: ORAL_TABLET | ORAL | 0 refills | Status: DC

## 2017-05-21 NOTE — Progress Notes (Signed)
Progress Note   Patient ID: Cory Weber, male   DOB: 1950/12/06, 67 y.o.   MRN: [id number]  Chief Complaint  Patient presents with  . Shoulder Pain    right shoulder pain / worked with PT today     67 year old male chronic right shoulder pain currently on Percocet prescribed by Dr. Luna Glasgow back in February 2019 receiving physical therapy improving complains of soreness after therapy today    Review of Systems  Neurological: Negative for tingling and sensory change.   Current Meds  Medication Sig  . acetaminophen (TYLENOL) 325 MG tablet Take 650 mg by mouth every 4 (four) hours as needed for fever. For temp greater than 100.4  . allopurinol (ZYLOPRIM) 100 MG tablet Take 200 mg by mouth every evening.  . Amino Acids-Protein Hydrolys (FEEDING SUPPLEMENT, PRO-STAT SUGAR FREE 64,) LIQD Take 30 mLs by mouth 2 (two) times daily.  Marland Kitchen aspirin EC 81 MG tablet Take 81 mg by mouth daily.  . carvedilol (COREG) 3.125 MG tablet Take 1 tablet (3.125 mg total) by mouth 2 (two) times daily with a meal.  . Cholecalciferol (VITAMIN D3) 2000 units TABS Take 1 tablet by mouth daily.  . clopidogrel (PLAVIX) 75 MG tablet Take 1 tablet (75 mg total) by mouth daily with breakfast.  . ferrous sulfate 325 (65 FE) MG tablet Take 325 mg by mouth 2 (two) times daily.   Marland Kitchen gabapentin (NEURONTIN) 600 MG tablet Take 1 tablet (600 mg total) by mouth 3 (three) times daily.  . insulin aspart (NOVOLOG) 100 UNIT/ML injection Inject 5 Units into the skin 3 (three) times daily after meals. (Patient taking differently: Inject 2-8 Units into the skin as directed. Inject 5 units 3 times daily after meals.  Inject 3 times daily before meals as per sliding scale: 150-200= 2 units, 201-250= 4 units, 251-300= 6 units, 301-350= 8 units, if >251 notify MD)  . insulin glargine (LANTUS) 100 UNIT/ML injection Inject 0.25 mLs (25 Units total) into the skin at bedtime. (Patient taking differently: Inject 20 Units into the skin at bedtime. )   . lisinopril (PRINIVIL,ZESTRIL) 5 MG tablet Take 5 mg by mouth daily.   . magnesium hydroxide (MILK OF MAGNESIA) 400 MG/5ML suspension Take 30 mLs by mouth daily as needed for mild constipation.  . metFORMIN (GLUCOPHAGE) 500 MG tablet Take 500 mg by mouth daily.   . Oxycodone HCl 10 MG TABS Take 1 tablet (10 mg total) by mouth every 4 (four) hours as needed (pain).  Marland Kitchen oxyCODONE-acetaminophen (PERCOCET) 7.5-325 MG tablet One tablet every six hours as needed for pain.  14 day limit.  . polyethylene glycol (MIRALAX / GLYCOLAX) packet Take 17 g by mouth 2 (two) times daily. (Patient taking differently: Take 17 g by mouth every 12 (twelve) hours as needed for mild constipation. )  . Probiotic CAPS Take 1 capsule by mouth 2 (two) times daily.  Marland Kitchen senna-docusate (SENOKOT-S) 8.6-50 MG tablet Take 1 tablet by mouth 2 (two) times daily. (Patient taking differently: Take 1 tablet by mouth 2 (two) times daily. Hold for loose stools)  . simvastatin (ZOCOR) 20 MG tablet Take 20 mg by mouth daily.   Marland Kitchen torsemide (DEMADEX) 20 MG tablet Take 20 mg by mouth daily.   . vitamin C (ASCORBIC ACID) 500 MG tablet Take 500 mg by mouth 2 (two) times daily.  . Vitamin D, Ergocalciferol, (DRISDOL) 50000 units CAPS capsule Take 50,000 Units by mouth every Friday. Takes every Friday   . [DISCONTINUED] oxyCODONE-acetaminophen (PERCOCET)  7.5-325 MG tablet One tablet every six hours as needed for pain.  14 day limit.  . [DISCONTINUED] oxyCODONE-acetaminophen (PERCOCET) 7.5-325 MG tablet One tablet every six hours as needed for pain.  14 day limit.    Allergies  Allergen Reactions  . No Known Allergies      BP 118/80   Pulse 86   Resp 16   Physical Exam Normal alignment no swelling mild tenderness anterolateral joint line Active flexion right shoulder 90 degrees passive flexion 110 degrees slightly painful Normal external/internal rotation Normal neurovascular exam No instability in the shoulder Supraclavicular region  no lymph nodes are palpable Weakness of the supraspinatus normal internal/external rotation strength   Medical decision-making Encounter Diagnosis  Name Primary?  . Chronic right shoulder pain Yes    Recommend therapy and continue medicine follow-up with Dr. Luna Glasgow in 6 weeks if needed  Meds ordered this encounter  Medications  . DISCONTD: oxyCODONE-acetaminophen (PERCOCET) 7.5-325 MG tablet    Sig: One tablet every six hours as needed for pain.  14 day limit.    Dispense:  56 tablet    Refill:  0  . oxyCODONE-acetaminophen (PERCOCET) 7.5-325 MG tablet    Sig: One tablet every six hours as needed for pain.  14 day limit.    Dispense:  56 tablet    Refill:  0     Arther Abbott, MD 05/21/2017 2:24 PM

## 2017-05-21 NOTE — Progress Notes (Signed)
sh

## 2017-05-21 NOTE — Patient Instructions (Signed)
Dr Luna Glasgow if needed

## 2017-08-18 ENCOUNTER — Encounter: Payer: Self-pay | Admitting: Internal Medicine

## 2017-09-23 ENCOUNTER — Ambulatory Visit (INDEPENDENT_AMBULATORY_CARE_PROVIDER_SITE_OTHER): Payer: Medicaid Other | Admitting: Orthopaedic Surgery

## 2017-09-23 ENCOUNTER — Telehealth: Payer: Self-pay | Admitting: Orthopaedic Surgery

## 2017-09-23 ENCOUNTER — Encounter: Payer: Self-pay | Admitting: Orthopaedic Surgery

## 2017-09-23 DIAGNOSIS — G8929 Other chronic pain: Secondary | ICD-10-CM | POA: Diagnosis not present

## 2017-09-23 DIAGNOSIS — M25511 Pain in right shoulder: Secondary | ICD-10-CM

## 2017-09-23 NOTE — Telephone Encounter (Signed)
Call from clinical coordinator Katherine Basset at facility where patient resides, [facility], ph# [phone], with questions related to medication prescribed today. States no prescription attached, orders only; also said that previously, the medical provider for facility discontinued all pain medication, and that patient was in agreement with that. States also that patient had recently refused other treatment offered - physical therapy and Voltaren gel.*  * I spoke with Dr Luna Glasgow, and he states that he does not want to break the rules; therefore, to go by the facility medical director's orders.

## 2017-09-23 NOTE — Progress Notes (Signed)
PROCEDURE NOTE:  The patient request injection, verbal consent was obtained.  The right shoulder was prepped appropriately after time out was performed.   Sterile technique was observed and injection of 1 cc of Depo-Medrol 40 mg with several cc's of plain xylocaine. Anesthesia was provided by ethyl chloride and a 20-gauge needle was used to inject the shoulder area. A posterior approach was used.  The injection was tolerated well.  A band aid dressing was applied.  The patient was advised to apply ice later today and tomorrow to the injection sight as needed.  Forms for nursing home completed.  Return in three months.  Electronically Signed Sanjuana Kava, MD 8/13/20192:21 PM

## 2017-10-14 DIAGNOSIS — G4730 Sleep apnea, unspecified: Secondary | ICD-10-CM | POA: Diagnosis not present

## 2017-10-14 DIAGNOSIS — D649 Anemia, unspecified: Secondary | ICD-10-CM | POA: Diagnosis not present

## 2017-10-14 DIAGNOSIS — R52 Pain, unspecified: Secondary | ICD-10-CM | POA: Diagnosis not present

## 2017-10-14 DIAGNOSIS — G629 Polyneuropathy, unspecified: Secondary | ICD-10-CM | POA: Diagnosis not present

## 2017-12-03 DIAGNOSIS — Z23 Encounter for immunization: Secondary | ICD-10-CM | POA: Diagnosis not present

## 2017-12-04 DIAGNOSIS — I5032 Chronic diastolic (congestive) heart failure: Secondary | ICD-10-CM | POA: Diagnosis not present

## 2017-12-04 DIAGNOSIS — D509 Iron deficiency anemia, unspecified: Secondary | ICD-10-CM | POA: Diagnosis not present

## 2017-12-04 DIAGNOSIS — N189 Chronic kidney disease, unspecified: Secondary | ICD-10-CM | POA: Diagnosis not present

## 2017-12-04 DIAGNOSIS — I130 Hypertensive heart and chronic kidney disease with heart failure and stage 1 through stage 4 chronic kidney disease, or unspecified chronic kidney disease: Secondary | ICD-10-CM | POA: Diagnosis not present

## 2017-12-09 DIAGNOSIS — D631 Anemia in chronic kidney disease: Secondary | ICD-10-CM | POA: Diagnosis not present

## 2017-12-12 DIAGNOSIS — Z79899 Other long term (current) drug therapy: Secondary | ICD-10-CM | POA: Diagnosis not present

## 2017-12-12 DIAGNOSIS — E119 Type 2 diabetes mellitus without complications: Secondary | ICD-10-CM | POA: Diagnosis not present

## 2017-12-15 DIAGNOSIS — B1920 Unspecified viral hepatitis C without hepatic coma: Secondary | ICD-10-CM | POA: Diagnosis not present

## 2017-12-18 DIAGNOSIS — I10 Essential (primary) hypertension: Secondary | ICD-10-CM | POA: Diagnosis not present

## 2017-12-18 DIAGNOSIS — E708 Other disorders of aromatic amino-acid metabolism: Secondary | ICD-10-CM | POA: Diagnosis not present

## 2017-12-18 DIAGNOSIS — R7989 Other specified abnormal findings of blood chemistry: Secondary | ICD-10-CM | POA: Diagnosis not present

## 2017-12-24 ENCOUNTER — Ambulatory Visit (INDEPENDENT_AMBULATORY_CARE_PROVIDER_SITE_OTHER): Payer: Medicare Other | Admitting: Orthopaedic Surgery

## 2017-12-24 ENCOUNTER — Encounter: Payer: Self-pay | Admitting: Orthopaedic Surgery

## 2017-12-24 VITALS — BP 131/78 | HR 89

## 2017-12-24 DIAGNOSIS — M25511 Pain in right shoulder: Secondary | ICD-10-CM | POA: Diagnosis not present

## 2017-12-24 DIAGNOSIS — G8929 Other chronic pain: Secondary | ICD-10-CM

## 2017-12-24 NOTE — Progress Notes (Signed)
Patient Cory Weber, male DOB:[birth year], 67 y.o. TML:[id number]  Chief Complaint  Patient presents with  . Shoulder Pain    Right shoulder    HPI  Cory Weber is a 67 y.o. male who has chronic shoulder pain on the right.  He is a bilateral below knee amputee and depends on manual wheelchair to get about.  He is a resident at a local nursing home.  They have done PT on the shoulder and he is improved.  He still has some pain but much better.  He will continue to do his exercises.   There is no height or weight on file to calculate BMI.  ROS  Review of Systems  Constitutional: Positive for activity change.  HENT: Negative for congestion.   Respiratory: Positive for shortness of breath. Negative for cough.   Cardiovascular: Negative for chest pain and leg swelling.  Endocrine: Positive for cold intolerance.  Musculoskeletal: Positive for arthralgias and gait problem.  Allergic/Immunologic: Positive for environmental allergies.  All other systems reviewed and are negative.   All other systems reviewed and are negative.  The following is a summary of the past history medically, past history surgically, known current medicines, social history and family history.  This information is gathered electronically by the computer from prior information and documentation.  I review this each visit and have found including this information at this point in the chart is beneficial and informative.    Past Medical History:  Diagnosis Date  . Anemia   . Asthma   . BPH (benign prostatic hyperplasia)   . Cellulitis and abscess of foot 07/2015  . Chronic diastolic heart failure ([place])    per Nursing facility  . Chronic kidney disease    CKD stage 3  . CVA (cerebral vascular accident) ([place]) 2015   denies residual on 08/15/2015  . GERD (gastroesophageal reflux disease)   . Hepatitis C    states he was diagnosed in 2007 or 2007 while living in [place], [place]  . Hepatitis C   .  Hyperlipidemia   . Hypertension   . Ischemia of foot 05/31/2016  . Osteomyelitis ([place])    per Nursing Facility  . Peripheral neuropathy   . Pneumonia X 1  . PVD (peripheral vascular disease) ([place])   . Type II diabetes mellitus ([place])   . Wet gangrene ([place]) 06/10/2016    Past Surgical History:  Procedure Laterality Date  . ABDOMINAL AORTOGRAM N/A 06/03/2016   Procedure: Abdominal Aortogram;  Surgeon: Waynetta Sandy, MD;  Location: [facility];  Service: Cardiovascular;  Laterality: N/A;  . AMPUTATION Left 08/16/2015   Procedure: LEFT BELOW THE KNEE AMPUTATION ;  Surgeon: Serafina Mitchell, MD;  Location: [facility];  Service: Vascular;  Laterality: Left;  . AMPUTATION Right 08/22/2016   Procedure: AMPUTATION BELOW KNEE-RIGHT;  Surgeon: Waynetta Sandy, MD;  Location: [facility];  Service: Vascular;  Laterality: Right;  . BACK SURGERY    . BELOW KNEE LEG AMPUTATION Left 08/16/2015  . BIOPSY N/A 09/03/2012   Procedure: BIOPSY;  Surgeon: Daneil Dolin, MD;  Location: [facility];  Service: Endoscopy;  Laterality: N/A;  gastric and gastric mucosa  . BIOPSY N/A 12/03/2012   Procedure: BIOPSY;  Surgeon: Daneil Dolin, MD;  Location: [facility];  Service: Endoscopy;  Laterality: N/A;  . COLONOSCOPY WITH PROPOFOL N/A 09/03/2012   KCL:EXNTZGY polyp-removed as outlined above. Prominent internal hemorrhoids. Tubular adenoma  . ESOPHAGOGASTRODUODENOSCOPY (EGD) WITH PROPOFOL N/A 09/03/2012  LXB:WIOMBT hernia. Gastric diverticulum. Gastric ulcers with associated erosions. Duodenal erosions. Status post gastric biopsy. H.PYLORI gastritis   . ESOPHAGOGASTRODUODENOSCOPY (EGD) WITH PROPOFOL N/A 12/03/2012   Dr. Gala Romney: gastric diverticulum, gastric erosions and scar. Previously noted gastric ulcer completed healed. Biopsy without H.pylori.   . ESOPHAGOGASTRODUODENOSCOPY (EGD) WITH PROPOFOL N/A 01/23/2016   Procedure: ESOPHAGOGASTRODUODENOSCOPY (EGD) WITH PROPOFOL;  Surgeon: Doran Stabler, MD;   [facility];  Service: Gastroenterology;  Laterality: N/A;  . I&D EXTREMITY Right 07/11/2016   Procedure: DELAY PRIMARY CLOSURE FOOT AMPUTATION;  Surgeon: Edrick Kins, DPM;  Location: [facility];  Service: Podiatry;  Laterality: Right;  . LIVER BIOPSY  2005   Done in [place], [place]. Chronic hepatitis with mild periportal inflammation, lobular unicellular necrosis and portal fibrosis. Grade 2, stage 1-2.  . LOWER EXTREMITY ANGIOGRAPHY Right 06/03/2016   Procedure: Lower Extremity Angiography;  Surgeon: Waynetta Sandy, MD;  Location: [facility];  Service: Cardiovascular;  Laterality: Right;  . MAXIMUM ACCESS (MAS)POSTERIOR LUMBAR INTERBODY FUSION (PLIF) 1 LEVEL N/A 11/25/2013   Procedure: FOR MAXIMUM ACCESS (MAS) POSTERIOR LUMBAR INTERBODY FUSION (PLIF) 1 LEVEL;  Surgeon: Eustace Moore, MD;  Location: [facility];  Service: Neurosurgery;  Laterality: N/A;  FOR MAXIMUM ACCESS (MAS) POSTERIOR LUMBAR INTERBODY FUSION (PLIF) 1 LEVEL LUMBAR 3-4  . PERIPHERAL VASCULAR ATHERECTOMY Right 06/03/2016   Procedure: Peripheral Vascular Atherectomy;  Surgeon: Waynetta Sandy, MD;  Location: [facility];  Service: Cardiovascular;  Laterality: Right;  SFA WITH STENT  . PERIPHERAL VASCULAR CATHETERIZATION Left 08/01/2015   Procedure: Lower Extremity Angiography;  Surgeon: Serafina Mitchell, MD;  Location: [facility];  Service: Cardiovascular;  Laterality: Left;  . PERIPHERAL VASCULAR CATHETERIZATION N/A 08/01/2015   Procedure: Abdominal Aortogram;  Surgeon: Serafina Mitchell, MD;  Location: [facility];  Service: Cardiovascular;  Laterality: N/A;  . PERIPHERAL VASCULAR CATHETERIZATION N/A 08/08/2015   Procedure: Abdominal Aortogram w/Lower Extremity;  Surgeon: Serafina Mitchell, MD;  Location: [facility];  Service: Cardiovascular;  Laterality: N/A;  . PERIPHERAL VASCULAR CATHETERIZATION Left 08/08/2015   Procedure: Peripheral Vascular Balloon Angioplasty;   Surgeon: Serafina Mitchell, MD;  Location: [facility];  Service: Cardiovascular;  Laterality: Left;  left popiteal artery, left peronealtrunk, left post tibial  . POLYPECTOMY N/A 09/03/2012   Procedure: POLYPECTOMY;  Surgeon: Daneil Dolin, MD;  [facility];  Service: Endoscopy;  Laterality: N/A;  cecal polyp  . TONSILLECTOMY    . TRANSMETATARSAL AMPUTATION Right 06/13/2016   Procedure: RIGHT TRANSMETATARSAL AMPUTATION;  Surgeon: Waynetta Sandy, MD;  Location: [facility];  Service: Vascular;  Laterality: Right;  . TRANSMETATARSAL AMPUTATION Right 07/10/2016   Procedure: REVISION RIGHT TRANSMETATARSAL AMPUTATION;  Surgeon: Waynetta Sandy, MD;  Location: [facility];  Service: Vascular;  Laterality: Right;    Family History  Problem Relation Age of Onset  . Breast cancer Mother   . Heart failure Mother   . Diabetes Father   . Hypertension Father   . Heart disease Father   . Hyperlipidemia Father   . Diabetes Sister   . Hypertension Sister   . Breast cancer Sister   . Colon cancer Neg Hx   . Liver disease Neg Hx     Social History Social History   Tobacco Use  . Smoking status: Former Smoker    Packs/day: 1.00    Years: 47.00    Pack years: 47.00    Types: Cigarettes  Last attempt to quit: 07/13/2015    Years since quitting: 2.4  . Smokeless tobacco: Never Used  Substance Use Topics  . Alcohol use: No    Alcohol/week: 0.0 standard drinks    Comment:  none 08/15/2015 "stopped drinking 3-4 years ago"  . Drug use: No    Comment: 08/15/2015 "last used cocaine 3-4 months ago"    Allergies  Allergen Reactions  . No Known Allergies     Current Outpatient Medications  Medication Sig Dispense Refill  . acetaminophen (TYLENOL) 325 MG tablet Take 650 mg by mouth every 4 (four) hours as needed for fever. For temp greater than 100.4    . allopurinol (ZYLOPRIM) 100 MG tablet Take 200 mg by mouth every evening.    . Amino Acids-Protein Hydrolys (FEEDING SUPPLEMENT,  PRO-STAT SUGAR FREE 64,) LIQD Take 30 mLs by mouth 2 (two) times daily. 900 mL 0  . aspirin EC 81 MG tablet Take 81 mg by mouth daily.    . carvedilol (COREG) 3.125 MG tablet Take 1 tablet (3.125 mg total) by mouth 2 (two) times daily with a meal. 60 tablet 0  . Cholecalciferol (VITAMIN D3) 2000 units TABS Take 1 tablet by mouth daily.    . clopidogrel (PLAVIX) 75 MG tablet Take 1 tablet (75 mg total) by mouth daily with breakfast. 30 tablet 0  . ferrous sulfate 325 (65 FE) MG tablet Take 325 mg by mouth 2 (two) times daily.     Marland Kitchen gabapentin (NEURONTIN) 600 MG tablet Take 1 tablet (600 mg total) by mouth 3 (three) times daily.    . insulin aspart (NOVOLOG) 100 UNIT/ML injection Inject 5 Units into the skin 3 (three) times daily after meals. (Patient taking differently: Inject 2-8 Units into the skin as directed. Inject 5 units 3 times daily after meals.  Inject 3 times daily before meals as per sliding scale: 150-200= 2 units, 201-250= 4 units, 251-300= 6 units, 301-350= 8 units, if >251 notify MD)    . insulin glargine (LANTUS) 100 UNIT/ML injection Inject 0.25 mLs (25 Units total) into the skin at bedtime. (Patient taking differently: Inject 20 Units into the skin at bedtime. )    . lisinopril (PRINIVIL,ZESTRIL) 5 MG tablet Take 5 mg by mouth daily.     . magnesium hydroxide (MILK OF MAGNESIA) 400 MG/5ML suspension Take 30 mLs by mouth daily as needed for mild constipation.    . metFORMIN (GLUCOPHAGE) 500 MG tablet Take 500 mg by mouth daily.     . polyethylene glycol (MIRALAX / GLYCOLAX) packet Take 17 g by mouth 2 (two) times daily. (Patient taking differently: Take 17 g by mouth every 12 (twelve) hours as needed for mild constipation. ) 14 each 0  . Probiotic CAPS Take 1 capsule by mouth 2 (two) times daily.    Marland Kitchen senna-docusate (SENOKOT-S) 8.6-50 MG tablet Take 1 tablet by mouth 2 (two) times daily. (Patient taking differently: Take 1 tablet by mouth 2 (two) times daily. Hold for loose stools)     . simvastatin (ZOCOR) 20 MG tablet Take 20 mg by mouth daily.     . tamsulosin (FLOMAX) 0.4 MG CAPS capsule Take 1 capsule (0.4 mg total) by mouth daily. (Patient not taking: Reported on 05/21/2017) 30 capsule 0  . torsemide (DEMADEX) 20 MG tablet Take 20 mg by mouth daily.     . vitamin C (ASCORBIC ACID) 500 MG tablet Take 500 mg by mouth 2 (two) times daily.    . Vitamin D, Ergocalciferol, (  DRISDOL) 50000 units CAPS capsule Take 50,000 Units by mouth every Friday. Takes every Friday      No current facility-administered medications for this visit.      Physical Exam  Blood pressure 131/78, pulse 89.  Constitutional: overall normal hygiene, normal nutrition, well developed, normal grooming, normal body habitus. Assistive device:wheelchair  Musculoskeletal: gait and station Limp bilateral below knee amputee, unable to stand, muscle tone and strength are normal, no tremors or atrophy is present.  .  Neurological: coordination overall normal upper extremities.  Deep tendon reflex/nerve stretch intact.  Sensation normal.  Cranial nerves II-XII intact.   Skin:   Normal overall no scars, lesions, ulcers or rashes. No psoriasis.  Psychiatric: Alert and oriented x 3.  Recent memory intact, remote memory unclear.  Normal mood and affect. Well groomed.  Good eye contact.  Cardiovascular: overall the arms, no clubbing, cyanosis and good capillary refill. Bilateral below knee amputations.  Lymphatic: palpation is normal.  Examination of right Upper Extremity is done.  Inspection:   Overall:  Elbow non-tender without crepitus or defects, forearm non-tender without crepitus or defects, wrist non-tender without crepitus or defects, hand non-tender.    Shoulder: with glenohumeral joint tenderness, without effusion.   Upper arm: without swelling and tenderness   Range of motion:   Overall:  Full range of motion of the elbow, full range of motion of wrist and full range of motion in  fingers.   Shoulder:  right  165 degrees forward flexion; 150 degrees abduction; 35 degrees internal rotation, 35 degrees external rotation, 15 degrees extension, 40 degrees adduction.   Stability:   Overall:  Shoulder, elbow and wrist stable   Strength and Tone:   Overall full shoulder muscles strength, full upper arm strength and normal upper arm bulk and tone.  All other systems reviewed and are negative   The patient has been educated about the nature of the problem(s) and counseled on treatment options.  The patient appeared to understand what I have discussed and is in agreement with it.  Encounter Diagnosis  Name Primary?  . Chronic right shoulder pain Yes    PLAN Call if any problems.  Precautions discussed.  Continue current medications.   Return to clinic PRN   Forms for nursing home completed.  Electronically Signed Sanjuana Kava, MD 11/13/20192:37 PM

## 2017-12-25 DIAGNOSIS — D649 Anemia, unspecified: Secondary | ICD-10-CM | POA: Diagnosis not present

## 2017-12-25 DIAGNOSIS — N644 Mastodynia: Secondary | ICD-10-CM | POA: Diagnosis not present

## 2018-01-01 DIAGNOSIS — I10 Essential (primary) hypertension: Secondary | ICD-10-CM | POA: Diagnosis not present

## 2018-01-01 DIAGNOSIS — Z8619 Personal history of other infectious and parasitic diseases: Secondary | ICD-10-CM | POA: Diagnosis not present

## 2018-01-01 DIAGNOSIS — E119 Type 2 diabetes mellitus without complications: Secondary | ICD-10-CM | POA: Diagnosis not present

## 2018-01-01 DIAGNOSIS — N644 Mastodynia: Secondary | ICD-10-CM | POA: Diagnosis not present

## 2018-01-01 DIAGNOSIS — I130 Hypertensive heart and chronic kidney disease with heart failure and stage 1 through stage 4 chronic kidney disease, or unspecified chronic kidney disease: Secondary | ICD-10-CM | POA: Diagnosis not present

## 2018-01-01 DIAGNOSIS — I5032 Chronic diastolic (congestive) heart failure: Secondary | ICD-10-CM | POA: Diagnosis not present

## 2018-01-21 DIAGNOSIS — R52 Pain, unspecified: Secondary | ICD-10-CM | POA: Diagnosis not present

## 2018-01-21 DIAGNOSIS — I739 Peripheral vascular disease, unspecified: Secondary | ICD-10-CM | POA: Diagnosis not present

## 2018-01-21 DIAGNOSIS — I10 Essential (primary) hypertension: Secondary | ICD-10-CM | POA: Diagnosis not present

## 2018-01-21 DIAGNOSIS — I5032 Chronic diastolic (congestive) heart failure: Secondary | ICD-10-CM | POA: Diagnosis not present

## 2018-02-05 ENCOUNTER — Encounter: Payer: Self-pay | Admitting: Surgery

## 2018-02-05 ENCOUNTER — Other Ambulatory Visit: Payer: Self-pay

## 2018-02-05 ENCOUNTER — Ambulatory Visit (INDEPENDENT_AMBULATORY_CARE_PROVIDER_SITE_OTHER): Payer: Medicare Other | Admitting: Surgery

## 2018-02-05 VITALS — BP 144/81 | HR 73 | Temp 97.5°F | Resp 20 | Ht 72.0 in | Wt 205.0 lb

## 2018-02-05 DIAGNOSIS — Z89512 Acquired absence of left leg below knee: Secondary | ICD-10-CM | POA: Diagnosis not present

## 2018-02-05 DIAGNOSIS — Z89511 Acquired absence of right leg below knee: Secondary | ICD-10-CM | POA: Diagnosis not present

## 2018-02-05 NOTE — Progress Notes (Signed)
[facility]  Patient name: Cory Weber MRN: [id number] DOB: 06-Oct-1950 Sex: male   REASON FOR VISIT:    Follow up  HISOTRY OF PRESENT ILLNESS:    Cory Weber is a 67 y.o. male who returns today for follow-up.  He is status post left below-knee amputation on 08/17/2015 following maggot infestation to his leg.  He then went on to have a right below-knee amputation on 08/22/2016 for a necrotic wound of a right Lisfranc amputation.  He has been ambulatory with a left below-knee prosthesis.  He is waiting on right leg prosthesis.  He is eager to start walking again.  No change in chronic medical conditions   PAST MEDICAL HISTORY:   Past Medical History:  Diagnosis Date  . Anemia   . Asthma   . BPH (benign prostatic hyperplasia)   . Cellulitis and abscess of foot 07/2015  . Chronic diastolic heart failure ([place])    per Nursing facility  . Chronic kidney disease    CKD stage 3  . CVA (cerebral vascular accident) ([place]) 2015   denies residual on 08/15/2015  . GERD (gastroesophageal reflux disease)   . Hepatitis C    states he was diagnosed in 2007 or 2007 while living in [place], [place]  . Hepatitis C   . Hyperlipidemia   . Hypertension   . Ischemia of foot 05/31/2016  . Osteomyelitis ([place])    per Nursing Facility  . Peripheral neuropathy   . Pneumonia X 1  . PVD (peripheral vascular disease) ([place])   . Type II diabetes mellitus ([place])   . Wet gangrene ([place]) 06/10/2016     FAMILY HISTORY:   Family History  Problem Relation Age of Onset  . Breast cancer Mother   . Heart failure Mother   . Diabetes Father   . Hypertension Father   . Heart disease Father   . Hyperlipidemia Father   . Diabetes Sister   . Hypertension Sister   . Breast cancer Sister   . Colon cancer Neg Hx   . Liver disease Neg Hx     SOCIAL HISTORY:   Social History   Tobacco Use  . Smoking status: Former Smoker    Packs/day:  1.00    Years: 47.00    Pack years: 47.00    Types: Cigarettes    Last attempt to quit: 07/13/2015    Years since quitting: 2.5  . Smokeless tobacco: Never Used  Substance Use Topics  . Alcohol use: No    Alcohol/week: 0.0 standard drinks    Comment:  none 08/15/2015 "stopped drinking 3-4 years ago"     ALLERGIES:   Allergies  Allergen Reactions  . No Known Allergies      CURRENT MEDICATIONS:   Current Outpatient Medications  Medication Sig Dispense Refill  . acetaminophen (TYLENOL) 325 MG tablet Take 650 mg by mouth every 4 (four) hours as needed for fever. For temp greater than 100.4    . allopurinol (ZYLOPRIM) 100 MG tablet Take 200 mg by mouth every evening.    . Amino Acids-Protein Hydrolys (FEEDING SUPPLEMENT, PRO-STAT SUGAR FREE 64,) LIQD Take 30 mLs by mouth 2 (two) times daily. 900 mL 0  . aspirin EC 81 MG tablet Take 81 mg by mouth daily.    . carvedilol (COREG) 3.125 MG tablet Take 1 tablet (3.125 mg total) by mouth 2 (two) times daily with a meal. 60 tablet 0  . Cholecalciferol (VITAMIN D3) 2000 units TABS  Take 1 tablet by mouth daily.    . clopidogrel (PLAVIX) 75 MG tablet Take 1 tablet (75 mg total) by mouth daily with breakfast. 30 tablet 0  . ferrous sulfate 325 (65 FE) MG tablet Take 325 mg by mouth 2 (two) times daily.     Marland Kitchen gabapentin (NEURONTIN) 600 MG tablet Take 1 tablet (600 mg total) by mouth 3 (three) times daily.    . insulin aspart (NOVOLOG) 100 UNIT/ML injection Inject 5 Units into the skin 3 (three) times daily after meals. (Patient taking differently: Inject 2-8 Units into the skin as directed. Inject 5 units 3 times daily after meals.  Inject 3 times daily before meals as per sliding scale: 150-200= 2 units, 201-250= 4 units, 251-300= 6 units, 301-350= 8 units, if >251 notify MD)    . insulin glargine (LANTUS) 100 UNIT/ML injection Inject 0.25 mLs (25 Units total) into the skin at bedtime. (Patient taking differently: Inject 20 Units into the skin at  bedtime. )    . lisinopril (PRINIVIL,ZESTRIL) 5 MG tablet Take 5 mg by mouth daily.     . magnesium hydroxide (MILK OF MAGNESIA) 400 MG/5ML suspension Take 30 mLs by mouth daily as needed for mild constipation.    . metFORMIN (GLUCOPHAGE) 500 MG tablet Take 500 mg by mouth daily.     . polyethylene glycol (MIRALAX / GLYCOLAX) packet Take 17 g by mouth 2 (two) times daily. (Patient taking differently: Take 17 g by mouth every 12 (twelve) hours as needed for mild constipation. ) 14 each 0  . Probiotic CAPS Take 1 capsule by mouth 2 (two) times daily.    Marland Kitchen senna-docusate (SENOKOT-S) 8.6-50 MG tablet Take 1 tablet by mouth 2 (two) times daily. (Patient taking differently: Take 1 tablet by mouth 2 (two) times daily. Hold for loose stools)    . simvastatin (ZOCOR) 20 MG tablet Take 20 mg by mouth daily.     . tamsulosin (FLOMAX) 0.4 MG CAPS capsule Take 1 capsule (0.4 mg total) by mouth daily. 30 capsule 0  . torsemide (DEMADEX) 20 MG tablet Take 20 mg by mouth daily.     . vitamin C (ASCORBIC ACID) 500 MG tablet Take 500 mg by mouth 2 (two) times daily.    . Vitamin D, Ergocalciferol, (DRISDOL) 50000 units CAPS capsule Take 50,000 Units by mouth every Friday. Takes every Friday      No current facility-administered medications for this visit.     REVIEW OF SYSTEMS:   [X]  denotes positive finding, [ ]  denotes negative finding Cardiac  Comments:  Chest pain or chest pressure:    Shortness of breath upon exertion:    Short of breath when lying flat:    Irregular heart rhythm:        Vascular    Pain in calf, thigh, or hip brought on by ambulation:    Pain in feet at night that wakes you up from your sleep:     Blood clot in your veins:    Leg swelling:         Pulmonary    Oxygen at home:    Productive cough:     Wheezing:         Neurologic    Sudden weakness in arms or legs:     Sudden numbness in arms or legs:     Sudden onset of difficulty speaking or slurred speech:    Temporary  loss of vision in one eye:     Problems  with dizziness:         Gastrointestinal    Blood in stool:     Vomited blood:         Genitourinary    Burning when urinating:     Blood in urine:        Psychiatric    Major depression:         Hematologic    Bleeding problems:    Problems with blood clotting too easily:        Skin    Rashes or ulcers:        Constitutional    Fever or chills:      PHYSICAL EXAM:   Vitals:   02/05/18 1248  BP: (!) 144/81  Pulse: 73  Resp: 20  Temp: (!) 97.5 F (36.4 C)  SpO2: 96%  Weight: 205 lb (93 kg)  Height: 6' (1.829 m)    GENERAL: The patient is a well-nourished male, in no acute distress. The vital signs are documented above. CARDIAC: There is a regular rate and rhythm.  PULMONARY: Non-labored respirations ABDOMEN: Soft and non-tender with normal pitched bowel sounds.  MUSCULOSKELETAL: bilateral BKA NEUROLOGIC: No focal weakness or paresthesias are detected. SKIN: There are no ulcers or rashes noted. PSYCHIATRIC: The patient has a normal affect.  STUDIES:   none  MEDICAL ISSUES:   Status post bilateral below-knee amputations: The patient is eager to began ambulation with prosthesis.  He is cleared from a vascular perspective to proceed with prosthetic development and physical therapy utilization.    Annamarie Major, MD [facility] [phone] Pager [phone]

## 2018-03-05 DIAGNOSIS — Z89511 Acquired absence of right leg below knee: Secondary | ICD-10-CM | POA: Diagnosis not present

## 2018-03-05 DIAGNOSIS — Z89512 Acquired absence of left leg below knee: Secondary | ICD-10-CM | POA: Diagnosis not present

## 2018-03-05 DIAGNOSIS — M6281 Muscle weakness (generalized): Secondary | ICD-10-CM | POA: Diagnosis not present

## 2018-03-06 DIAGNOSIS — M6281 Muscle weakness (generalized): Secondary | ICD-10-CM | POA: Diagnosis not present

## 2018-03-06 DIAGNOSIS — Z89511 Acquired absence of right leg below knee: Secondary | ICD-10-CM | POA: Diagnosis not present

## 2018-03-06 DIAGNOSIS — Z89512 Acquired absence of left leg below knee: Secondary | ICD-10-CM | POA: Diagnosis not present

## 2018-03-09 DIAGNOSIS — M6281 Muscle weakness (generalized): Secondary | ICD-10-CM | POA: Diagnosis not present

## 2018-03-09 DIAGNOSIS — Z89511 Acquired absence of right leg below knee: Secondary | ICD-10-CM | POA: Diagnosis not present

## 2018-03-09 DIAGNOSIS — I130 Hypertensive heart and chronic kidney disease with heart failure and stage 1 through stage 4 chronic kidney disease, or unspecified chronic kidney disease: Secondary | ICD-10-CM | POA: Diagnosis not present

## 2018-03-09 DIAGNOSIS — Z89512 Acquired absence of left leg below knee: Secondary | ICD-10-CM | POA: Diagnosis not present

## 2018-03-09 DIAGNOSIS — I5032 Chronic diastolic (congestive) heart failure: Secondary | ICD-10-CM | POA: Diagnosis not present

## 2018-03-09 DIAGNOSIS — N181 Chronic kidney disease, stage 1: Secondary | ICD-10-CM | POA: Diagnosis not present

## 2018-03-10 DIAGNOSIS — Z89512 Acquired absence of left leg below knee: Secondary | ICD-10-CM | POA: Diagnosis not present

## 2018-03-10 DIAGNOSIS — Z89511 Acquired absence of right leg below knee: Secondary | ICD-10-CM | POA: Diagnosis not present

## 2018-03-10 DIAGNOSIS — M6281 Muscle weakness (generalized): Secondary | ICD-10-CM | POA: Diagnosis not present

## 2018-03-11 DIAGNOSIS — M6281 Muscle weakness (generalized): Secondary | ICD-10-CM | POA: Diagnosis not present

## 2018-03-11 DIAGNOSIS — Z89511 Acquired absence of right leg below knee: Secondary | ICD-10-CM | POA: Diagnosis not present

## 2018-03-11 DIAGNOSIS — Z89512 Acquired absence of left leg below knee: Secondary | ICD-10-CM | POA: Diagnosis not present

## 2018-03-12 DIAGNOSIS — M6281 Muscle weakness (generalized): Secondary | ICD-10-CM | POA: Diagnosis not present

## 2018-03-12 DIAGNOSIS — Z89511 Acquired absence of right leg below knee: Secondary | ICD-10-CM | POA: Diagnosis not present

## 2018-03-12 DIAGNOSIS — Z89512 Acquired absence of left leg below knee: Secondary | ICD-10-CM | POA: Diagnosis not present

## 2018-03-13 DIAGNOSIS — M6281 Muscle weakness (generalized): Secondary | ICD-10-CM | POA: Diagnosis not present

## 2018-03-13 DIAGNOSIS — Z89511 Acquired absence of right leg below knee: Secondary | ICD-10-CM | POA: Diagnosis not present

## 2018-03-13 DIAGNOSIS — Z89512 Acquired absence of left leg below knee: Secondary | ICD-10-CM | POA: Diagnosis not present

## 2018-03-16 ENCOUNTER — Ambulatory Visit (INDEPENDENT_AMBULATORY_CARE_PROVIDER_SITE_OTHER): Payer: Medicare Other | Admitting: Cardiovascular Disease

## 2018-03-16 ENCOUNTER — Encounter: Payer: Self-pay | Admitting: Cardiovascular Disease

## 2018-03-16 VITALS — BP 124/80 | HR 83

## 2018-03-16 DIAGNOSIS — Z89511 Acquired absence of right leg below knee: Secondary | ICD-10-CM | POA: Diagnosis not present

## 2018-03-16 DIAGNOSIS — R079 Chest pain, unspecified: Secondary | ICD-10-CM

## 2018-03-16 DIAGNOSIS — Z8673 Personal history of transient ischemic attack (TIA), and cerebral infarction without residual deficits: Secondary | ICD-10-CM

## 2018-03-16 DIAGNOSIS — Z89512 Acquired absence of left leg below knee: Secondary | ICD-10-CM | POA: Diagnosis not present

## 2018-03-16 DIAGNOSIS — I739 Peripheral vascular disease, unspecified: Secondary | ICD-10-CM

## 2018-03-16 DIAGNOSIS — Z89611 Acquired absence of right leg above knee: Secondary | ICD-10-CM | POA: Diagnosis not present

## 2018-03-16 DIAGNOSIS — M6281 Muscle weakness (generalized): Secondary | ICD-10-CM | POA: Diagnosis not present

## 2018-03-16 DIAGNOSIS — I10 Essential (primary) hypertension: Secondary | ICD-10-CM | POA: Diagnosis not present

## 2018-03-16 DIAGNOSIS — Z89612 Acquired absence of left leg above knee: Secondary | ICD-10-CM

## 2018-03-16 NOTE — Progress Notes (Signed)
CARDIOLOGY CONSULT NOTE  Patient ID: Cory Weber MRN: [id number] DOB/AGE: [birth year] 68 y.o.  Admit date: (Not on file) Primary Physician: Iona Beard, MD Referring Physician: Iona Beard, MD  Reason for Consultation: Chest pain  HPI: Cory Weber is a 68 y.o. male who is being seen today for the evaluation of chest pain at the request of Iona Beard, MD.   Past medical history includes left below the knee amputation in July 2017 for maggot infestation to the leg, hepatitis C, hypertension, prior history of alcohol and tobacco use, CVA, PVD with prior left femoropopliteal bypass in June 2017, right BKA, and insulin-dependent diabetes mellitus.  Echocardiogram 02/15/2016 demonstrated normal left ventricular systolic function, LVEF 50 to 55%, mild septal and moderate posterior wall hypertrophy, normal regional wall motion, grade 2 diastolic dysfunction, high ventricular filling pressures.  He apparently underwent a nuclear stress test in December 2017 but I am unable to access these results.  I reviewed labs dated 12/09/2017: Hemoglobin 7.4, platelets 344, LDL 68.  I also reviewed labs dated 01/01/2018 which showed BUN 13.9 and creatinine 0.97.  ECG performed in the office today which I ordered and personally interpreted demonstrates normal sinus rhythm with old inferior infarct and PAC.  He told me he has had intermittent left-sided chest pains for months.  They sometimes subside with changes of body position in bed.  He has associated shortness of breath.  He denies palpitations and syncope.  He denies GERD symptoms but does admit to having previous symptoms related to GERD.  Chest pains can last anywhere from seconds to a few minutes.     Allergies  Allergen Reactions  . No Known Allergies     Current Outpatient Medications  Medication Sig Dispense Refill  . acetaminophen (TYLENOL) 325 MG tablet Take 650 mg by mouth every 4 (four) hours as needed for fever.  For temp greater than 100.4    . allopurinol (ZYLOPRIM) 100 MG tablet Take 200 mg by mouth every evening.    . Amino Acids-Protein Hydrolys (FEEDING SUPPLEMENT, PRO-STAT SUGAR FREE 64,) LIQD Take 30 mLs by mouth 2 (two) times daily. 900 mL 0  . aspirin EC 81 MG tablet Take 81 mg by mouth daily.    . carvedilol (COREG) 3.125 MG tablet Take 1 tablet (3.125 mg total) by mouth 2 (two) times daily with a meal. 60 tablet 0  . Cholecalciferol (VITAMIN D3) 2000 units TABS Take 1 tablet by mouth daily.    . clopidogrel (PLAVIX) 75 MG tablet Take 1 tablet (75 mg total) by mouth daily with breakfast. 30 tablet 0  . ferrous sulfate 325 (65 FE) MG tablet Take 325 mg by mouth 2 (two) times daily.     Marland Kitchen gabapentin (NEURONTIN) 600 MG tablet Take 1 tablet (600 mg total) by mouth 3 (three) times daily.    . insulin aspart (NOVOLOG) 100 UNIT/ML injection Inject 5 Units into the skin 3 (three) times daily after meals. (Patient taking differently: Inject 2-8 Units into the skin as directed. Inject 5 units 3 times daily after meals.  Inject 3 times daily before meals as per sliding scale: 150-200= 2 units, 201-250= 4 units, 251-300= 6 units, 301-350= 8 units, if >251 notify MD)    . insulin glargine (LANTUS) 100 UNIT/ML injection Inject 0.25 mLs (25 Units total) into the skin at bedtime. (Patient taking differently: Inject 20 Units into the skin at bedtime. )    . lisinopril (PRINIVIL,ZESTRIL) 5 MG tablet  Take 5 mg by mouth daily.     . magnesium hydroxide (MILK OF MAGNESIA) 400 MG/5ML suspension Take 30 mLs by mouth daily as needed for mild constipation.    . metFORMIN (GLUCOPHAGE) 500 MG tablet Take 500 mg by mouth daily.     . polyethylene glycol (MIRALAX / GLYCOLAX) packet Take 17 g by mouth 2 (two) times daily. (Patient taking differently: Take 17 g by mouth every 12 (twelve) hours as needed for mild constipation. ) 14 each 0  . Probiotic CAPS Take 1 capsule by mouth 2 (two) times daily.    Marland Kitchen senna-docusate  (SENOKOT-S) 8.6-50 MG tablet Take 1 tablet by mouth 2 (two) times daily. (Patient taking differently: Take 1 tablet by mouth 2 (two) times daily. Hold for loose stools)    . simvastatin (ZOCOR) 20 MG tablet Take 20 mg by mouth daily.     . tamsulosin (FLOMAX) 0.4 MG CAPS capsule Take 1 capsule (0.4 mg total) by mouth daily. 30 capsule 0  . torsemide (DEMADEX) 20 MG tablet Take 20 mg by mouth daily.     . vitamin C (ASCORBIC ACID) 500 MG tablet Take 500 mg by mouth 2 (two) times daily.    . Vitamin D, Ergocalciferol, (DRISDOL) 50000 units CAPS capsule Take 50,000 Units by mouth every Friday. Takes every Friday      No current facility-administered medications for this visit.     Past Medical History:  Diagnosis Date  . Anemia   . Asthma   . BPH (benign prostatic hyperplasia)   . Cellulitis and abscess of foot 07/2015  . Chronic diastolic heart failure ([place])    per Nursing facility  . Chronic kidney disease    CKD stage 3  . CVA (cerebral vascular accident) ([place]) 2015   denies residual on 08/15/2015  . GERD (gastroesophageal reflux disease)   . Hepatitis C    states he was diagnosed in 2007 or 2007 while living in [place], [place]  . Hepatitis C   . Hyperlipidemia   . Hypertension   . Ischemia of foot 05/31/2016  . Osteomyelitis ([place])    per Nursing Facility  . Peripheral neuropathy   . Pneumonia X 1  . PVD (peripheral vascular disease) ([place])   . Type II diabetes mellitus ([place])   . 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 ABDOMINAL AORTOGRAM N/A 06/03/2016   Procedure: Abdominal Aortogram;  Surgeon: Waynetta Sandy, MD;  Location: [facility];  Service: Cardiovascular;  Laterality: N/A;  . AMPUTATION Left 08/16/2015   Procedure: LEFT BELOW THE KNEE AMPUTATION ;  Surgeon: Serafina Mitchell, MD;  Location: [facility];  Service: Vascular;  Laterality: Left;  . AMPUTATION Right 08/22/2016   Procedure: AMPUTATION BELOW KNEE-RIGHT;   Surgeon: Waynetta Sandy, MD;  Location: [facility];  Service: Vascular;  Laterality: Right;  . BACK SURGERY    . BELOW KNEE LEG AMPUTATION Left 08/16/2015  . BIOPSY N/A 09/03/2012   Procedure: BIOPSY;  Surgeon: Daneil Dolin, MD;  [facility]: [facility];  Service: Endoscopy;  Laterality: N/A;  gastric and gastric mucosa  . BIOPSY N/A 12/03/2012   Procedure: BIOPSY;  Surgeon: Daneil Dolin, MD;  [facility]: [facility];  Service: Endoscopy;  Laterality: N/A;  . COLONOSCOPY WITH PROPOFOL N/A 09/03/2012   SEG:BTDVVOH polyp-removed as outlined above. Prominent internal hemorrhoids. Tubular adenoma  . ESOPHAGOGASTRODUODENOSCOPY (EGD) WITH PROPOFOL N/A 09/03/2012   YWV:PXTGGY hernia. Gastric diverticulum. Gastric ulcers  with associated erosions. Duodenal erosions. Status post gastric biopsy. H.PYLORI gastritis   . ESOPHAGOGASTRODUODENOSCOPY (EGD) WITH PROPOFOL N/A 12/03/2012   Dr. Gala Romney: gastric diverticulum, gastric erosions and scar. Previously noted gastric ulcer completed healed. Biopsy without H.pylori.   . ESOPHAGOGASTRODUODENOSCOPY (EGD) WITH PROPOFOL N/A 01/23/2016   Procedure: ESOPHAGOGASTRODUODENOSCOPY (EGD) WITH PROPOFOL;  Surgeon: Doran Stabler, MD;  [facility];  Service: Gastroenterology;  Laterality: N/A;  . I&D EXTREMITY Right 07/11/2016   Procedure: DELAY PRIMARY CLOSURE FOOT AMPUTATION;  Surgeon: Edrick Kins, DPM;  Location: [facility];  Service: Podiatry;  Laterality: Right;  . LIVER BIOPSY  2005   Done in [place], [place]. Chronic hepatitis with mild periportal inflammation, lobular unicellular necrosis and portal fibrosis. Grade 2, stage 1-2.  . LOWER EXTREMITY ANGIOGRAPHY Right 06/03/2016   Procedure: Lower Extremity Angiography;  Surgeon: Waynetta Sandy, MD;  Location: [facility];  Service: Cardiovascular;  Laterality: Right;  . MAXIMUM ACCESS (MAS)POSTERIOR LUMBAR INTERBODY FUSION (PLIF) 1 LEVEL N/A 11/25/2013   Procedure: FOR MAXIMUM ACCESS (MAS) POSTERIOR  LUMBAR INTERBODY FUSION (PLIF) 1 LEVEL;  Surgeon: Eustace Moore, MD;  Location: [facility];  Service: Neurosurgery;  Laterality: N/A;  FOR MAXIMUM ACCESS (MAS) POSTERIOR LUMBAR INTERBODY FUSION (PLIF) 1 LEVEL LUMBAR 3-4  . PERIPHERAL VASCULAR ATHERECTOMY Right 06/03/2016   Procedure: Peripheral Vascular Atherectomy;  Surgeon: Waynetta Sandy, MD;  Location: [facility];  Service: Cardiovascular;  Laterality: Right;  SFA WITH STENT  . PERIPHERAL VASCULAR CATHETERIZATION Left 08/01/2015   Procedure: Lower Extremity Angiography;  Surgeon: Serafina Mitchell, MD;  Location: [facility];  Service: Cardiovascular;  Laterality: Left;  . PERIPHERAL VASCULAR CATHETERIZATION N/A 08/01/2015   Procedure: Abdominal Aortogram;  Surgeon: Serafina Mitchell, MD;  Location: [facility];  Service: Cardiovascular;  Laterality: N/A;  . PERIPHERAL VASCULAR CATHETERIZATION N/A 08/08/2015   Procedure: Abdominal Aortogram w/Lower Extremity;  Surgeon: Serafina Mitchell, MD;  Location: [facility];  Service: Cardiovascular;  Laterality: N/A;  . PERIPHERAL VASCULAR CATHETERIZATION Left 08/08/2015   Procedure: Peripheral Vascular Balloon Angioplasty;  Surgeon: Serafina Mitchell, MD;  Location: [facility];  Service: Cardiovascular;  Laterality: Left;  left popiteal artery, left peronealtrunk, left post tibial  . POLYPECTOMY N/A 09/03/2012   Procedure: POLYPECTOMY;  Surgeon: Daneil Dolin, MD;  [facility];  Service: Endoscopy;  Laterality: N/A;  cecal polyp  . TONSILLECTOMY    . TRANSMETATARSAL AMPUTATION Right 06/13/2016   Procedure: RIGHT TRANSMETATARSAL AMPUTATION;  Surgeon: Waynetta Sandy, MD;  Location: [facility];  Service: Vascular;  Laterality: Right;  . TRANSMETATARSAL AMPUTATION Right 07/10/2016   Procedure: REVISION RIGHT TRANSMETATARSAL AMPUTATION;  Surgeon: Waynetta Sandy, MD;  Location: [facility];  Service: Vascular;  Laterality: Right;    Social History    Socioeconomic History  . Marital status: Divorced    Spouse name: Not on file  . Number of children: 1  . Years of education: Not on file  . Highest education level: Not on file  Occupational History  . Occupation: disabled  Social Needs  . Financial resource strain: Not on file  . Food insecurity:    Worry: Not on file    Inability: Not on file  . Transportation needs:    Medical: Not on file    Non-medical: Not on file  Tobacco Use  . Smoking status: Former Smoker    Packs/day: 1.00    Years: 47.00  Pack years: 47.00    Types: Cigarettes    Last attempt to quit: 07/13/2015    Years since quitting: 2.6  . Smokeless tobacco: Never Used  Substance and Sexual Activity  . Alcohol use: No    Alcohol/week: 0.0 standard drinks    Comment:  none 08/15/2015 "stopped drinking 3-4 years ago"  . Drug use: No    Comment: 08/15/2015 "last used cocaine 3-4 months ago"  . Sexual activity: Never    Birth control/protection: Condom  Lifestyle  . Physical activity:    Days per week: Not on file    Minutes per session: Not on file  . Stress: Not on file  Relationships  . Social connections:    Talks on phone: Not on file    Gets together: Not on file    Attends religious service: Not on file    Active member of club or organization: Not on file    Attends meetings of clubs or organizations: Not on file    Relationship status: Not on file  . Intimate partner violence:    Fear of current or ex partner: Not on file    Emotionally abused: Not on file    Physically abused: Not on file    Forced sexual activity: Not on file  Other Topics Concern  . Not on file  Social History Narrative  . Not on file     No family history of premature CAD in 1st degree relatives.  Current Meds  Medication Sig  . acetaminophen (TYLENOL) 325 MG tablet Take 650 mg by mouth every 4 (four) hours as needed for fever. For temp greater than 100.4  . allopurinol (ZYLOPRIM) 100 MG tablet Take 200 mg by  mouth every evening.  . Amino Acids-Protein Hydrolys (FEEDING SUPPLEMENT, PRO-STAT SUGAR FREE 64,) LIQD Take 30 mLs by mouth 2 (two) times daily.  Marland Kitchen aspirin EC 81 MG tablet Take 81 mg by mouth daily.  . carvedilol (COREG) 3.125 MG tablet Take 1 tablet (3.125 mg total) by mouth 2 (two) times daily with a meal.  . Cholecalciferol (VITAMIN D3) 2000 units TABS Take 1 tablet by mouth daily.  . clopidogrel (PLAVIX) 75 MG tablet Take 1 tablet (75 mg total) by mouth daily with breakfast.  . ferrous sulfate 325 (65 FE) MG tablet Take 325 mg by mouth 2 (two) times daily.   Marland Kitchen gabapentin (NEURONTIN) 600 MG tablet Take 1 tablet (600 mg total) by mouth 3 (three) times daily.  . insulin aspart (NOVOLOG) 100 UNIT/ML injection Inject 5 Units into the skin 3 (three) times daily after meals. (Patient taking differently: Inject 2-8 Units into the skin as directed. Inject 5 units 3 times daily after meals.  Inject 3 times daily before meals as per sliding scale: 150-200= 2 units, 201-250= 4 units, 251-300= 6 units, 301-350= 8 units, if >251 notify MD)  . insulin glargine (LANTUS) 100 UNIT/ML injection Inject 0.25 mLs (25 Units total) into the skin at bedtime. (Patient taking differently: Inject 20 Units into the skin at bedtime. )  . lisinopril (PRINIVIL,ZESTRIL) 5 MG tablet Take 5 mg by mouth daily.   . magnesium hydroxide (MILK OF MAGNESIA) 400 MG/5ML suspension Take 30 mLs by mouth daily as needed for mild constipation.  . metFORMIN (GLUCOPHAGE) 500 MG tablet Take 500 mg by mouth daily.   . polyethylene glycol (MIRALAX / GLYCOLAX) packet Take 17 g by mouth 2 (two) times daily. (Patient taking differently: Take 17 g by mouth every 12 (twelve) hours  as needed for mild constipation. )  . Probiotic CAPS Take 1 capsule by mouth 2 (two) times daily.  Marland Kitchen senna-docusate (SENOKOT-S) 8.6-50 MG tablet Take 1 tablet by mouth 2 (two) times daily. (Patient taking differently: Take 1 tablet by mouth 2 (two) times daily. Hold for loose  stools)  . simvastatin (ZOCOR) 20 MG tablet Take 20 mg by mouth daily.   . tamsulosin (FLOMAX) 0.4 MG CAPS capsule Take 1 capsule (0.4 mg total) by mouth daily.  Marland Kitchen torsemide (DEMADEX) 20 MG tablet Take 20 mg by mouth daily.   . vitamin C (ASCORBIC ACID) 500 MG tablet Take 500 mg by mouth 2 (two) times daily.  . Vitamin D, Ergocalciferol, (DRISDOL) 50000 units CAPS capsule Take 50,000 Units by mouth every Friday. Takes every Friday       Review of systems complete and found to be negative unless listed above in HPI    Physical exam Blood pressure 124/80, pulse 83, SpO2 93 %. General: NAD Neck: No JVD, no thyromegaly or thyroid nodule.  Lungs: Diffusely diminished breath sounds with expiratory rhonchi bilaterally. CV: Nondisplaced PMI. Regular rate and rhythm, normal S1/S2, no S3/S4, no murmur.  Bilateral BKA.  No carotid bruit.    Abdomen: Soft, nontender, no distention.  Skin: Intact without lesions or rashes.  Neurologic: Alert and oriented x 3.  Psych: Normal affect. Extremities: Bilateral BKA HEENT: Normal.   ECG: Most recent ECG reviewed.   Labs: Lab Results  Component Value Date/Time   K 4.3 10/05/2016 07:50 AM   K 4.0 10/31/2011 01:19 PM   BUN 24 (H) 10/05/2016 07:50 AM   BUN 16 12/27/2015   CREATININE 1.08 10/05/2016 07:50 AM   CREATININE 1.06 12/22/2015 03:51 PM   ALT 31 07/18/2016 04:31 AM   TSH 2.687 08/15/2015 06:15 AM   TSH 4.359 02/05/2011 05:45 AM   HGB 6.8 (LL) 10/05/2016 07:50 AM     Lipids: Lab Results  Component Value Date/Time   LDLCALC 84 11/29/2015   CHOL 142 11/29/2015   TRIG 75 11/29/2015   HDL 43 11/29/2015        ASSESSMENT AND PLAN:  1.  Chest pain: Symptoms are somewhat atypical in that they sometimes subside with positional changes.  He does have several cardiovascular risk factors including peripheral arterial disease and diabetes mellitus.  ECG shows evidence for old inferior infarct pattern. I will proceed with a nuclear  myocardial perfusion imaging study to evaluate for ischemic heart disease (Lexiscan Myoview).  2.  Peripheral arterial disease: Status post bilateral BKA.  He takes aspirin, Plavix and simvastatin 20 mg.  3.  Hypertension: Controlled on present therapy which includes carvedilol and lisinopril.  No changes.  4.  History of CVA: Currently on aspirin, Plavix, and simvastatin.   Disposition: Follow up in 3 months  Signed: Kate Sable, M.D., F.A.C.C.  03/16/2018, 9:40 AM

## 2018-03-16 NOTE — Patient Instructions (Signed)
Medication Instructions:  Your physician recommends that you continue on your current medications as directed. Please refer to the Current Medication list given to you today.  If you need a refill on your cardiac medications before your next appointment, please call your pharmacy.   Lab work: None today If you have labs (blood work) drawn today and your tests are completely normal, you will receive your results only by: Marland Kitchen MyChart Message (if you have MyChart) OR . A paper copy in the mail If you have any lab test that is abnormal or we need to change your treatment, we will call you to review the results.  Testing/Procedures: Your physician has requested that you have a lexiscan myoview. For further information please visit [email or web address]. Please follow instruction sheet, as given.    Follow-Up: At [facility], you and your health needs are our priority.  As part of our continuing mission to provide you with exceptional heart care, we have created designated Provider Care Teams.  These Care Teams include your primary Cardiologist (physician) and Advanced Practice Providers (APPs -  Physician Assistants and Nurse Practitioners) who all work together to provide you with the care you need, when you need it. You will need a follow up appointment in 3 months.  Please call our office 2 months in advance to schedule this appointment.  You may see Kate Sable, MD or one of the following Advanced Practice Providers on your designated Care Team:   Bernerd Pho, PA-C [facility]) . Ermalinda Barrios, PA-C ([place])  Any Other Special Instructions Will Be Listed Below (If Applicable). none

## 2018-03-17 DIAGNOSIS — Z89511 Acquired absence of right leg below knee: Secondary | ICD-10-CM | POA: Diagnosis not present

## 2018-03-17 DIAGNOSIS — M6281 Muscle weakness (generalized): Secondary | ICD-10-CM | POA: Diagnosis not present

## 2018-03-17 DIAGNOSIS — Z89512 Acquired absence of left leg below knee: Secondary | ICD-10-CM | POA: Diagnosis not present

## 2018-03-18 DIAGNOSIS — M6281 Muscle weakness (generalized): Secondary | ICD-10-CM | POA: Diagnosis not present

## 2018-03-18 DIAGNOSIS — Z89511 Acquired absence of right leg below knee: Secondary | ICD-10-CM | POA: Diagnosis not present

## 2018-03-18 DIAGNOSIS — Z89512 Acquired absence of left leg below knee: Secondary | ICD-10-CM | POA: Diagnosis not present

## 2018-03-19 DIAGNOSIS — M6281 Muscle weakness (generalized): Secondary | ICD-10-CM | POA: Diagnosis not present

## 2018-03-19 DIAGNOSIS — Z89512 Acquired absence of left leg below knee: Secondary | ICD-10-CM | POA: Diagnosis not present

## 2018-03-19 DIAGNOSIS — Z89511 Acquired absence of right leg below knee: Secondary | ICD-10-CM | POA: Diagnosis not present

## 2018-03-20 DIAGNOSIS — Z89511 Acquired absence of right leg below knee: Secondary | ICD-10-CM | POA: Diagnosis not present

## 2018-03-20 DIAGNOSIS — M6281 Muscle weakness (generalized): Secondary | ICD-10-CM | POA: Diagnosis not present

## 2018-03-20 DIAGNOSIS — Z89512 Acquired absence of left leg below knee: Secondary | ICD-10-CM | POA: Diagnosis not present

## 2018-03-23 DIAGNOSIS — Z89512 Acquired absence of left leg below knee: Secondary | ICD-10-CM | POA: Diagnosis not present

## 2018-03-23 DIAGNOSIS — M6281 Muscle weakness (generalized): Secondary | ICD-10-CM | POA: Diagnosis not present

## 2018-03-23 DIAGNOSIS — Z89511 Acquired absence of right leg below knee: Secondary | ICD-10-CM | POA: Diagnosis not present

## 2018-03-24 ENCOUNTER — Encounter (HOSPITAL_COMMUNITY)
Admission: RE | Admit: 2018-03-24 | Discharge: 2018-03-24 | Disposition: A | Discharge status: Home / Self Care | Payer: Medicare Other | Source: Ambulatory Visit | Attending: Cardiovascular Disease | Admitting: Cardiovascular Disease

## 2018-03-24 ENCOUNTER — Encounter (HOSPITAL_BASED_OUTPATIENT_CLINIC_OR_DEPARTMENT_OTHER)
Admission: RE | Admit: 2018-03-24 | Discharge: 2018-03-24 | Disposition: A | Discharge status: Home / Self Care | Payer: Medicare Other | Source: Ambulatory Visit | Attending: Cardiovascular Disease | Admitting: Cardiovascular Disease

## 2018-03-24 DIAGNOSIS — I130 Hypertensive heart and chronic kidney disease with heart failure and stage 1 through stage 4 chronic kidney disease, or unspecified chronic kidney disease: Secondary | ICD-10-CM | POA: Diagnosis not present

## 2018-03-24 DIAGNOSIS — I5032 Chronic diastolic (congestive) heart failure: Secondary | ICD-10-CM | POA: Diagnosis not present

## 2018-03-24 DIAGNOSIS — Z89511 Acquired absence of right leg below knee: Secondary | ICD-10-CM | POA: Diagnosis not present

## 2018-03-24 DIAGNOSIS — R079 Chest pain, unspecified: Secondary | ICD-10-CM | POA: Insufficient documentation

## 2018-03-24 DIAGNOSIS — Z89512 Acquired absence of left leg below knee: Secondary | ICD-10-CM | POA: Diagnosis not present

## 2018-03-24 DIAGNOSIS — M6281 Muscle weakness (generalized): Secondary | ICD-10-CM | POA: Diagnosis not present

## 2018-03-24 LAB — NM MYOCAR MULTI W/SPECT W/WALL MOTION / EF
CHL CUP NUCLEAR SRS: 0
LV dias vol: 105 mL (ref 62–150)
LV sys vol: 70 mL
Peak HR: 101 {beats}/min
RATE: 0.39
Rest HR: 72 {beats}/min
SDS: 0
SSS: 0
TID: 1.23

## 2018-03-24 MED ORDER — SODIUM CHLORIDE 0.9% FLUSH
INTRAVENOUS | Status: AC
  Administered 2018-03-24: 10 mL via INTRAVENOUS
  Filled 2018-03-24: qty 10

## 2018-03-24 MED ORDER — REGADENOSON 0.4 MG/5ML IV SOLN
INTRAVENOUS | Status: AC
  Administered 2018-03-24: 0.4 mg via INTRAVENOUS
  Filled 2018-03-24: qty 5

## 2018-03-24 MED ORDER — TECHNETIUM TC 99M TETROFOSMIN IV KIT
30.0000 | PACK | Freq: Once | INTRAVENOUS | Status: AC | PRN
  Administered 2018-03-24: 29.4 via INTRAVENOUS

## 2018-03-24 MED ORDER — TECHNETIUM TC 99M TETROFOSMIN IV KIT
10.0000 | PACK | Freq: Once | INTRAVENOUS | Status: AC | PRN
  Administered 2018-03-24: 9 via INTRAVENOUS

## 2018-03-25 ENCOUNTER — Telehealth: Payer: Self-pay

## 2018-03-25 DIAGNOSIS — R931 Abnormal findings on diagnostic imaging of heart and coronary circulation: Secondary | ICD-10-CM

## 2018-03-25 DIAGNOSIS — M6281 Muscle weakness (generalized): Secondary | ICD-10-CM | POA: Diagnosis not present

## 2018-03-25 DIAGNOSIS — Z89512 Acquired absence of left leg below knee: Secondary | ICD-10-CM | POA: Diagnosis not present

## 2018-03-25 DIAGNOSIS — Z89511 Acquired absence of right leg below knee: Secondary | ICD-10-CM | POA: Diagnosis not present

## 2018-03-25 NOTE — Telephone Encounter (Signed)
Patient is resident at [facility], report given to nurse Mariana Arn and front desk also given echo apt

## 2018-03-25 NOTE — Telephone Encounter (Signed)
-----   Message from Herminio Commons, MD sent at 03/24/2018  3:54 PM EST ----- Evidence of shadowing versus some scar tissue.  No obvious blockages.  Please obtain echocardiogram as cardiac function appeared reduced on the study.

## 2018-03-26 DIAGNOSIS — Z89511 Acquired absence of right leg below knee: Secondary | ICD-10-CM | POA: Diagnosis not present

## 2018-03-26 DIAGNOSIS — Z89512 Acquired absence of left leg below knee: Secondary | ICD-10-CM | POA: Diagnosis not present

## 2018-03-26 DIAGNOSIS — M6281 Muscle weakness (generalized): Secondary | ICD-10-CM | POA: Diagnosis not present

## 2018-03-27 DIAGNOSIS — Z89511 Acquired absence of right leg below knee: Secondary | ICD-10-CM | POA: Diagnosis not present

## 2018-03-27 DIAGNOSIS — M6281 Muscle weakness (generalized): Secondary | ICD-10-CM | POA: Diagnosis not present

## 2018-03-27 DIAGNOSIS — Z89512 Acquired absence of left leg below knee: Secondary | ICD-10-CM | POA: Diagnosis not present

## 2018-03-30 DIAGNOSIS — Z89512 Acquired absence of left leg below knee: Secondary | ICD-10-CM | POA: Diagnosis not present

## 2018-03-30 DIAGNOSIS — Z89511 Acquired absence of right leg below knee: Secondary | ICD-10-CM | POA: Diagnosis not present

## 2018-03-30 DIAGNOSIS — M6281 Muscle weakness (generalized): Secondary | ICD-10-CM | POA: Diagnosis not present

## 2018-03-31 DIAGNOSIS — M6281 Muscle weakness (generalized): Secondary | ICD-10-CM | POA: Diagnosis not present

## 2018-03-31 DIAGNOSIS — Z89511 Acquired absence of right leg below knee: Secondary | ICD-10-CM | POA: Diagnosis not present

## 2018-03-31 DIAGNOSIS — Z89512 Acquired absence of left leg below knee: Secondary | ICD-10-CM | POA: Diagnosis not present

## 2018-04-01 ENCOUNTER — Ambulatory Visit (HOSPITAL_COMMUNITY)
Admission: RE | Admit: 2018-04-01 | Discharge: 2018-04-01 | Disposition: A | Discharge status: Home / Self Care | Payer: Medicare Other | Source: Ambulatory Visit | Attending: Cardiovascular Disease | Admitting: Cardiovascular Disease

## 2018-04-01 DIAGNOSIS — E785 Hyperlipidemia, unspecified: Secondary | ICD-10-CM | POA: Diagnosis not present

## 2018-04-01 DIAGNOSIS — I10 Essential (primary) hypertension: Secondary | ICD-10-CM | POA: Diagnosis not present

## 2018-04-01 DIAGNOSIS — F17210 Nicotine dependence, cigarettes, uncomplicated: Secondary | ICD-10-CM | POA: Insufficient documentation

## 2018-04-01 DIAGNOSIS — Z89511 Acquired absence of right leg below knee: Secondary | ICD-10-CM | POA: Diagnosis not present

## 2018-04-01 DIAGNOSIS — E119 Type 2 diabetes mellitus without complications: Secondary | ICD-10-CM | POA: Insufficient documentation

## 2018-04-01 DIAGNOSIS — R931 Abnormal findings on diagnostic imaging of heart and coronary circulation: Secondary | ICD-10-CM | POA: Diagnosis not present

## 2018-04-01 DIAGNOSIS — I739 Peripheral vascular disease, unspecified: Secondary | ICD-10-CM | POA: Diagnosis not present

## 2018-04-01 DIAGNOSIS — Z89512 Acquired absence of left leg below knee: Secondary | ICD-10-CM | POA: Diagnosis not present

## 2018-04-01 DIAGNOSIS — M6281 Muscle weakness (generalized): Secondary | ICD-10-CM | POA: Diagnosis not present

## 2018-04-01 NOTE — Progress Notes (Signed)
*  PRELIMINARY RESULTS* Echocardiogram 2D Echocardiogram has been performed.  [name] 04/01/2018, 11:33 AM

## 2018-04-02 DIAGNOSIS — Z89511 Acquired absence of right leg below knee: Secondary | ICD-10-CM | POA: Diagnosis not present

## 2018-04-02 DIAGNOSIS — M6281 Muscle weakness (generalized): Secondary | ICD-10-CM | POA: Diagnosis not present

## 2018-04-02 DIAGNOSIS — Z89512 Acquired absence of left leg below knee: Secondary | ICD-10-CM | POA: Diagnosis not present

## 2018-04-03 DIAGNOSIS — Z89511 Acquired absence of right leg below knee: Secondary | ICD-10-CM | POA: Diagnosis not present

## 2018-04-03 DIAGNOSIS — M6281 Muscle weakness (generalized): Secondary | ICD-10-CM | POA: Diagnosis not present

## 2018-04-03 DIAGNOSIS — Z89512 Acquired absence of left leg below knee: Secondary | ICD-10-CM | POA: Diagnosis not present

## 2018-04-06 DIAGNOSIS — M6281 Muscle weakness (generalized): Secondary | ICD-10-CM | POA: Diagnosis not present

## 2018-04-06 DIAGNOSIS — Z89511 Acquired absence of right leg below knee: Secondary | ICD-10-CM | POA: Diagnosis not present

## 2018-04-06 DIAGNOSIS — Z89512 Acquired absence of left leg below knee: Secondary | ICD-10-CM | POA: Diagnosis not present

## 2018-04-07 DIAGNOSIS — M6281 Muscle weakness (generalized): Secondary | ICD-10-CM | POA: Diagnosis not present

## 2018-04-07 DIAGNOSIS — Z89512 Acquired absence of left leg below knee: Secondary | ICD-10-CM | POA: Diagnosis not present

## 2018-04-07 DIAGNOSIS — Z89511 Acquired absence of right leg below knee: Secondary | ICD-10-CM | POA: Diagnosis not present

## 2018-04-08 DIAGNOSIS — Z89511 Acquired absence of right leg below knee: Secondary | ICD-10-CM | POA: Diagnosis not present

## 2018-04-08 DIAGNOSIS — M6281 Muscle weakness (generalized): Secondary | ICD-10-CM | POA: Diagnosis not present

## 2018-04-08 DIAGNOSIS — Z89512 Acquired absence of left leg below knee: Secondary | ICD-10-CM | POA: Diagnosis not present

## 2018-04-09 DIAGNOSIS — Z89512 Acquired absence of left leg below knee: Secondary | ICD-10-CM | POA: Diagnosis not present

## 2018-04-09 DIAGNOSIS — Z89511 Acquired absence of right leg below knee: Secondary | ICD-10-CM | POA: Diagnosis not present

## 2018-04-09 DIAGNOSIS — M6281 Muscle weakness (generalized): Secondary | ICD-10-CM | POA: Diagnosis not present

## 2018-04-10 DIAGNOSIS — Z89511 Acquired absence of right leg below knee: Secondary | ICD-10-CM | POA: Diagnosis not present

## 2018-04-10 DIAGNOSIS — M6281 Muscle weakness (generalized): Secondary | ICD-10-CM | POA: Diagnosis not present

## 2018-04-10 DIAGNOSIS — Z89512 Acquired absence of left leg below knee: Secondary | ICD-10-CM | POA: Diagnosis not present

## 2018-04-13 DIAGNOSIS — Z89511 Acquired absence of right leg below knee: Secondary | ICD-10-CM | POA: Diagnosis not present

## 2018-04-13 DIAGNOSIS — Z89512 Acquired absence of left leg below knee: Secondary | ICD-10-CM | POA: Diagnosis not present

## 2018-04-13 DIAGNOSIS — M6281 Muscle weakness (generalized): Secondary | ICD-10-CM | POA: Diagnosis not present

## 2018-04-14 DIAGNOSIS — Z89511 Acquired absence of right leg below knee: Secondary | ICD-10-CM | POA: Diagnosis not present

## 2018-04-14 DIAGNOSIS — Z89512 Acquired absence of left leg below knee: Secondary | ICD-10-CM | POA: Diagnosis not present

## 2018-04-14 DIAGNOSIS — M6281 Muscle weakness (generalized): Secondary | ICD-10-CM | POA: Diagnosis not present

## 2018-04-15 DIAGNOSIS — R52 Pain, unspecified: Secondary | ICD-10-CM | POA: Diagnosis not present

## 2018-04-15 DIAGNOSIS — Z89511 Acquired absence of right leg below knee: Secondary | ICD-10-CM | POA: Diagnosis not present

## 2018-04-15 DIAGNOSIS — Z89512 Acquired absence of left leg below knee: Secondary | ICD-10-CM | POA: Diagnosis not present

## 2018-04-15 DIAGNOSIS — I10 Essential (primary) hypertension: Secondary | ICD-10-CM | POA: Diagnosis not present

## 2018-04-15 DIAGNOSIS — D649 Anemia, unspecified: Secondary | ICD-10-CM | POA: Diagnosis not present

## 2018-04-15 DIAGNOSIS — M6281 Muscle weakness (generalized): Secondary | ICD-10-CM | POA: Diagnosis not present

## 2018-04-16 DIAGNOSIS — Z89511 Acquired absence of right leg below knee: Secondary | ICD-10-CM | POA: Diagnosis not present

## 2018-04-16 DIAGNOSIS — Z89512 Acquired absence of left leg below knee: Secondary | ICD-10-CM | POA: Diagnosis not present

## 2018-04-16 DIAGNOSIS — M6281 Muscle weakness (generalized): Secondary | ICD-10-CM | POA: Diagnosis not present

## 2018-04-17 DIAGNOSIS — Z89511 Acquired absence of right leg below knee: Secondary | ICD-10-CM | POA: Diagnosis not present

## 2018-04-17 DIAGNOSIS — Z89512 Acquired absence of left leg below knee: Secondary | ICD-10-CM | POA: Diagnosis not present

## 2018-04-17 DIAGNOSIS — M6281 Muscle weakness (generalized): Secondary | ICD-10-CM | POA: Diagnosis not present

## 2018-04-20 DIAGNOSIS — Z89512 Acquired absence of left leg below knee: Secondary | ICD-10-CM | POA: Diagnosis not present

## 2018-04-20 DIAGNOSIS — Z89511 Acquired absence of right leg below knee: Secondary | ICD-10-CM | POA: Diagnosis not present

## 2018-04-20 DIAGNOSIS — M6281 Muscle weakness (generalized): Secondary | ICD-10-CM | POA: Diagnosis not present

## 2018-04-21 DIAGNOSIS — Z89512 Acquired absence of left leg below knee: Secondary | ICD-10-CM | POA: Diagnosis not present

## 2018-04-21 DIAGNOSIS — Z89511 Acquired absence of right leg below knee: Secondary | ICD-10-CM | POA: Diagnosis not present

## 2018-04-21 DIAGNOSIS — M6281 Muscle weakness (generalized): Secondary | ICD-10-CM | POA: Diagnosis not present

## 2018-04-22 DIAGNOSIS — M6281 Muscle weakness (generalized): Secondary | ICD-10-CM | POA: Diagnosis not present

## 2018-04-22 DIAGNOSIS — Z89512 Acquired absence of left leg below knee: Secondary | ICD-10-CM | POA: Diagnosis not present

## 2018-04-22 DIAGNOSIS — Z89511 Acquired absence of right leg below knee: Secondary | ICD-10-CM | POA: Diagnosis not present

## 2018-04-23 DIAGNOSIS — Z89512 Acquired absence of left leg below knee: Secondary | ICD-10-CM | POA: Diagnosis not present

## 2018-04-23 DIAGNOSIS — Z89511 Acquired absence of right leg below knee: Secondary | ICD-10-CM | POA: Diagnosis not present

## 2018-04-23 DIAGNOSIS — M6281 Muscle weakness (generalized): Secondary | ICD-10-CM | POA: Diagnosis not present

## 2018-04-24 DIAGNOSIS — Z89511 Acquired absence of right leg below knee: Secondary | ICD-10-CM | POA: Diagnosis not present

## 2018-04-24 DIAGNOSIS — M6281 Muscle weakness (generalized): Secondary | ICD-10-CM | POA: Diagnosis not present

## 2018-04-24 DIAGNOSIS — Z89512 Acquired absence of left leg below knee: Secondary | ICD-10-CM | POA: Diagnosis not present

## 2018-04-27 DIAGNOSIS — Z89512 Acquired absence of left leg below knee: Secondary | ICD-10-CM | POA: Diagnosis not present

## 2018-04-27 DIAGNOSIS — Z89511 Acquired absence of right leg below knee: Secondary | ICD-10-CM | POA: Diagnosis not present

## 2018-04-27 DIAGNOSIS — M6281 Muscle weakness (generalized): Secondary | ICD-10-CM | POA: Diagnosis not present

## 2018-04-28 DIAGNOSIS — Z89512 Acquired absence of left leg below knee: Secondary | ICD-10-CM | POA: Diagnosis not present

## 2018-04-28 DIAGNOSIS — Z89511 Acquired absence of right leg below knee: Secondary | ICD-10-CM | POA: Diagnosis not present

## 2018-04-28 DIAGNOSIS — M6281 Muscle weakness (generalized): Secondary | ICD-10-CM | POA: Diagnosis not present

## 2018-04-29 DIAGNOSIS — Z89512 Acquired absence of left leg below knee: Secondary | ICD-10-CM | POA: Diagnosis not present

## 2018-04-29 DIAGNOSIS — Z89511 Acquired absence of right leg below knee: Secondary | ICD-10-CM | POA: Diagnosis not present

## 2018-04-29 DIAGNOSIS — M6281 Muscle weakness (generalized): Secondary | ICD-10-CM | POA: Diagnosis not present

## 2018-04-30 DIAGNOSIS — M6281 Muscle weakness (generalized): Secondary | ICD-10-CM | POA: Diagnosis not present

## 2018-04-30 DIAGNOSIS — Z89512 Acquired absence of left leg below knee: Secondary | ICD-10-CM | POA: Diagnosis not present

## 2018-04-30 DIAGNOSIS — Z89511 Acquired absence of right leg below knee: Secondary | ICD-10-CM | POA: Diagnosis not present

## 2018-05-01 DIAGNOSIS — Z89511 Acquired absence of right leg below knee: Secondary | ICD-10-CM | POA: Diagnosis not present

## 2018-05-01 DIAGNOSIS — I739 Peripheral vascular disease, unspecified: Secondary | ICD-10-CM | POA: Diagnosis not present

## 2018-05-01 DIAGNOSIS — D649 Anemia, unspecified: Secondary | ICD-10-CM | POA: Diagnosis not present

## 2018-05-01 DIAGNOSIS — I7092 Chronic total occlusion of artery of the extremities: Secondary | ICD-10-CM | POA: Diagnosis not present

## 2018-05-01 DIAGNOSIS — Z89512 Acquired absence of left leg below knee: Secondary | ICD-10-CM | POA: Diagnosis not present

## 2018-05-01 DIAGNOSIS — M6281 Muscle weakness (generalized): Secondary | ICD-10-CM | POA: Diagnosis not present

## 2018-05-04 DIAGNOSIS — Z89511 Acquired absence of right leg below knee: Secondary | ICD-10-CM | POA: Diagnosis not present

## 2018-05-04 DIAGNOSIS — M6281 Muscle weakness (generalized): Secondary | ICD-10-CM | POA: Diagnosis not present

## 2018-05-04 DIAGNOSIS — Z89512 Acquired absence of left leg below knee: Secondary | ICD-10-CM | POA: Diagnosis not present

## 2018-05-05 DIAGNOSIS — Z89512 Acquired absence of left leg below knee: Secondary | ICD-10-CM | POA: Diagnosis not present

## 2018-05-05 DIAGNOSIS — M6281 Muscle weakness (generalized): Secondary | ICD-10-CM | POA: Diagnosis not present

## 2018-05-05 DIAGNOSIS — Z89511 Acquired absence of right leg below knee: Secondary | ICD-10-CM | POA: Diagnosis not present

## 2018-05-06 DIAGNOSIS — Z89512 Acquired absence of left leg below knee: Secondary | ICD-10-CM | POA: Diagnosis not present

## 2018-05-06 DIAGNOSIS — M6281 Muscle weakness (generalized): Secondary | ICD-10-CM | POA: Diagnosis not present

## 2018-05-06 DIAGNOSIS — Z89511 Acquired absence of right leg below knee: Secondary | ICD-10-CM | POA: Diagnosis not present

## 2018-05-07 DIAGNOSIS — M6281 Muscle weakness (generalized): Secondary | ICD-10-CM | POA: Diagnosis not present

## 2018-05-07 DIAGNOSIS — Z89512 Acquired absence of left leg below knee: Secondary | ICD-10-CM | POA: Diagnosis not present

## 2018-05-07 DIAGNOSIS — Z89511 Acquired absence of right leg below knee: Secondary | ICD-10-CM | POA: Diagnosis not present

## 2018-05-08 DIAGNOSIS — Z89512 Acquired absence of left leg below knee: Secondary | ICD-10-CM | POA: Diagnosis not present

## 2018-05-08 DIAGNOSIS — Z89511 Acquired absence of right leg below knee: Secondary | ICD-10-CM | POA: Diagnosis not present

## 2018-05-08 DIAGNOSIS — M6281 Muscle weakness (generalized): Secondary | ICD-10-CM | POA: Diagnosis not present

## 2018-05-11 DIAGNOSIS — Z89512 Acquired absence of left leg below knee: Secondary | ICD-10-CM | POA: Diagnosis not present

## 2018-05-11 DIAGNOSIS — M6281 Muscle weakness (generalized): Secondary | ICD-10-CM | POA: Diagnosis not present

## 2018-05-11 DIAGNOSIS — Z89511 Acquired absence of right leg below knee: Secondary | ICD-10-CM | POA: Diagnosis not present

## 2018-05-12 DIAGNOSIS — Z89511 Acquired absence of right leg below knee: Secondary | ICD-10-CM | POA: Diagnosis not present

## 2018-05-12 DIAGNOSIS — Z89512 Acquired absence of left leg below knee: Secondary | ICD-10-CM | POA: Diagnosis not present

## 2018-05-12 DIAGNOSIS — M6281 Muscle weakness (generalized): Secondary | ICD-10-CM | POA: Diagnosis not present

## 2018-05-13 DIAGNOSIS — Z89512 Acquired absence of left leg below knee: Secondary | ICD-10-CM | POA: Diagnosis not present

## 2018-05-13 DIAGNOSIS — M6281 Muscle weakness (generalized): Secondary | ICD-10-CM | POA: Diagnosis not present

## 2018-05-13 DIAGNOSIS — Z89511 Acquired absence of right leg below knee: Secondary | ICD-10-CM | POA: Diagnosis not present

## 2018-05-14 DIAGNOSIS — Z89511 Acquired absence of right leg below knee: Secondary | ICD-10-CM | POA: Diagnosis not present

## 2018-05-14 DIAGNOSIS — Z89512 Acquired absence of left leg below knee: Secondary | ICD-10-CM | POA: Diagnosis not present

## 2018-05-14 DIAGNOSIS — M6281 Muscle weakness (generalized): Secondary | ICD-10-CM | POA: Diagnosis not present

## 2018-05-15 DIAGNOSIS — Z89512 Acquired absence of left leg below knee: Secondary | ICD-10-CM | POA: Diagnosis not present

## 2018-05-15 DIAGNOSIS — M6281 Muscle weakness (generalized): Secondary | ICD-10-CM | POA: Diagnosis not present

## 2018-05-15 DIAGNOSIS — Z89511 Acquired absence of right leg below knee: Secondary | ICD-10-CM | POA: Diagnosis not present

## 2018-05-18 DIAGNOSIS — Z89512 Acquired absence of left leg below knee: Secondary | ICD-10-CM | POA: Diagnosis not present

## 2018-05-18 DIAGNOSIS — M6281 Muscle weakness (generalized): Secondary | ICD-10-CM | POA: Diagnosis not present

## 2018-05-18 DIAGNOSIS — Z89511 Acquired absence of right leg below knee: Secondary | ICD-10-CM | POA: Diagnosis not present

## 2018-05-19 DIAGNOSIS — Z89512 Acquired absence of left leg below knee: Secondary | ICD-10-CM | POA: Diagnosis not present

## 2018-05-19 DIAGNOSIS — Z89511 Acquired absence of right leg below knee: Secondary | ICD-10-CM | POA: Diagnosis not present

## 2018-05-19 DIAGNOSIS — M6281 Muscle weakness (generalized): Secondary | ICD-10-CM | POA: Diagnosis not present

## 2018-05-20 DIAGNOSIS — Z89511 Acquired absence of right leg below knee: Secondary | ICD-10-CM | POA: Diagnosis not present

## 2018-05-20 DIAGNOSIS — M6281 Muscle weakness (generalized): Secondary | ICD-10-CM | POA: Diagnosis not present

## 2018-05-20 DIAGNOSIS — Z89512 Acquired absence of left leg below knee: Secondary | ICD-10-CM | POA: Diagnosis not present

## 2018-05-21 DIAGNOSIS — M6281 Muscle weakness (generalized): Secondary | ICD-10-CM | POA: Diagnosis not present

## 2018-05-21 DIAGNOSIS — Z89512 Acquired absence of left leg below knee: Secondary | ICD-10-CM | POA: Diagnosis not present

## 2018-05-21 DIAGNOSIS — Z89511 Acquired absence of right leg below knee: Secondary | ICD-10-CM | POA: Diagnosis not present

## 2018-05-22 DIAGNOSIS — M6281 Muscle weakness (generalized): Secondary | ICD-10-CM | POA: Diagnosis not present

## 2018-05-22 DIAGNOSIS — Z89511 Acquired absence of right leg below knee: Secondary | ICD-10-CM | POA: Diagnosis not present

## 2018-05-22 DIAGNOSIS — Z89512 Acquired absence of left leg below knee: Secondary | ICD-10-CM | POA: Diagnosis not present

## 2018-05-25 DIAGNOSIS — M6281 Muscle weakness (generalized): Secondary | ICD-10-CM | POA: Diagnosis not present

## 2018-05-25 DIAGNOSIS — Z89512 Acquired absence of left leg below knee: Secondary | ICD-10-CM | POA: Diagnosis not present

## 2018-05-25 DIAGNOSIS — Z89511 Acquired absence of right leg below knee: Secondary | ICD-10-CM | POA: Diagnosis not present

## 2018-05-26 DIAGNOSIS — M6281 Muscle weakness (generalized): Secondary | ICD-10-CM | POA: Diagnosis not present

## 2018-05-26 DIAGNOSIS — Z89512 Acquired absence of left leg below knee: Secondary | ICD-10-CM | POA: Diagnosis not present

## 2018-05-26 DIAGNOSIS — Z89511 Acquired absence of right leg below knee: Secondary | ICD-10-CM | POA: Diagnosis not present

## 2018-05-27 DIAGNOSIS — Z89511 Acquired absence of right leg below knee: Secondary | ICD-10-CM | POA: Diagnosis not present

## 2018-05-27 DIAGNOSIS — M6281 Muscle weakness (generalized): Secondary | ICD-10-CM | POA: Diagnosis not present

## 2018-05-27 DIAGNOSIS — Z89512 Acquired absence of left leg below knee: Secondary | ICD-10-CM | POA: Diagnosis not present

## 2018-05-28 DIAGNOSIS — Z89512 Acquired absence of left leg below knee: Secondary | ICD-10-CM | POA: Diagnosis not present

## 2018-05-28 DIAGNOSIS — Z89511 Acquired absence of right leg below knee: Secondary | ICD-10-CM | POA: Diagnosis not present

## 2018-05-28 DIAGNOSIS — M6281 Muscle weakness (generalized): Secondary | ICD-10-CM | POA: Diagnosis not present

## 2018-05-29 DIAGNOSIS — Z89511 Acquired absence of right leg below knee: Secondary | ICD-10-CM | POA: Diagnosis not present

## 2018-05-29 DIAGNOSIS — M6281 Muscle weakness (generalized): Secondary | ICD-10-CM | POA: Diagnosis not present

## 2018-05-29 DIAGNOSIS — Z89512 Acquired absence of left leg below knee: Secondary | ICD-10-CM | POA: Diagnosis not present

## 2018-06-01 DIAGNOSIS — Z89511 Acquired absence of right leg below knee: Secondary | ICD-10-CM | POA: Diagnosis not present

## 2018-06-01 DIAGNOSIS — M6281 Muscle weakness (generalized): Secondary | ICD-10-CM | POA: Diagnosis not present

## 2018-06-01 DIAGNOSIS — Z89512 Acquired absence of left leg below knee: Secondary | ICD-10-CM | POA: Diagnosis not present

## 2018-06-02 DIAGNOSIS — Z89511 Acquired absence of right leg below knee: Secondary | ICD-10-CM | POA: Diagnosis not present

## 2018-06-02 DIAGNOSIS — M6281 Muscle weakness (generalized): Secondary | ICD-10-CM | POA: Diagnosis not present

## 2018-06-02 DIAGNOSIS — Z89512 Acquired absence of left leg below knee: Secondary | ICD-10-CM | POA: Diagnosis not present

## 2018-06-03 DIAGNOSIS — Z89512 Acquired absence of left leg below knee: Secondary | ICD-10-CM | POA: Diagnosis not present

## 2018-06-03 DIAGNOSIS — M6281 Muscle weakness (generalized): Secondary | ICD-10-CM | POA: Diagnosis not present

## 2018-06-03 DIAGNOSIS — Z89511 Acquired absence of right leg below knee: Secondary | ICD-10-CM | POA: Diagnosis not present

## 2018-06-04 DIAGNOSIS — N644 Mastodynia: Secondary | ICD-10-CM | POA: Diagnosis not present

## 2018-06-04 DIAGNOSIS — Z89512 Acquired absence of left leg below knee: Secondary | ICD-10-CM | POA: Diagnosis not present

## 2018-06-04 DIAGNOSIS — Z89511 Acquired absence of right leg below knee: Secondary | ICD-10-CM | POA: Diagnosis not present

## 2018-06-04 DIAGNOSIS — D509 Iron deficiency anemia, unspecified: Secondary | ICD-10-CM | POA: Diagnosis not present

## 2018-06-04 DIAGNOSIS — I130 Hypertensive heart and chronic kidney disease with heart failure and stage 1 through stage 4 chronic kidney disease, or unspecified chronic kidney disease: Secondary | ICD-10-CM | POA: Diagnosis not present

## 2018-06-04 DIAGNOSIS — K219 Gastro-esophageal reflux disease without esophagitis: Secondary | ICD-10-CM | POA: Diagnosis not present

## 2018-06-04 DIAGNOSIS — M6281 Muscle weakness (generalized): Secondary | ICD-10-CM | POA: Diagnosis not present

## 2018-06-05 DIAGNOSIS — Z89512 Acquired absence of left leg below knee: Secondary | ICD-10-CM | POA: Diagnosis not present

## 2018-06-05 DIAGNOSIS — M6281 Muscle weakness (generalized): Secondary | ICD-10-CM | POA: Diagnosis not present

## 2018-06-05 DIAGNOSIS — Z89511 Acquired absence of right leg below knee: Secondary | ICD-10-CM | POA: Diagnosis not present

## 2018-06-06 DIAGNOSIS — E785 Hyperlipidemia, unspecified: Secondary | ICD-10-CM | POA: Diagnosis not present

## 2018-06-06 DIAGNOSIS — I739 Peripheral vascular disease, unspecified: Secondary | ICD-10-CM | POA: Diagnosis not present

## 2018-06-06 DIAGNOSIS — M6281 Muscle weakness (generalized): Secondary | ICD-10-CM | POA: Diagnosis not present

## 2018-06-06 DIAGNOSIS — I10 Essential (primary) hypertension: Secondary | ICD-10-CM | POA: Diagnosis not present

## 2018-06-06 DIAGNOSIS — E119 Type 2 diabetes mellitus without complications: Secondary | ICD-10-CM | POA: Diagnosis not present

## 2018-06-06 DIAGNOSIS — D519 Vitamin B12 deficiency anemia, unspecified: Secondary | ICD-10-CM | POA: Diagnosis not present

## 2018-06-06 DIAGNOSIS — D649 Anemia, unspecified: Secondary | ICD-10-CM | POA: Diagnosis not present

## 2018-06-07 ENCOUNTER — Encounter: Payer: Self-pay | Admitting: Orthopaedic Surgery

## 2018-06-08 DIAGNOSIS — Z89511 Acquired absence of right leg below knee: Secondary | ICD-10-CM | POA: Diagnosis not present

## 2018-06-08 DIAGNOSIS — Z89512 Acquired absence of left leg below knee: Secondary | ICD-10-CM | POA: Diagnosis not present

## 2018-06-08 DIAGNOSIS — M6281 Muscle weakness (generalized): Secondary | ICD-10-CM | POA: Diagnosis not present

## 2018-06-09 DIAGNOSIS — Z89512 Acquired absence of left leg below knee: Secondary | ICD-10-CM | POA: Diagnosis not present

## 2018-06-09 DIAGNOSIS — M6281 Muscle weakness (generalized): Secondary | ICD-10-CM | POA: Diagnosis not present

## 2018-06-09 DIAGNOSIS — Z89511 Acquired absence of right leg below knee: Secondary | ICD-10-CM | POA: Diagnosis not present

## 2018-06-10 DIAGNOSIS — Z89512 Acquired absence of left leg below knee: Secondary | ICD-10-CM | POA: Diagnosis not present

## 2018-06-10 DIAGNOSIS — Z89511 Acquired absence of right leg below knee: Secondary | ICD-10-CM | POA: Diagnosis not present

## 2018-06-10 DIAGNOSIS — M6281 Muscle weakness (generalized): Secondary | ICD-10-CM | POA: Diagnosis not present

## 2018-06-11 DIAGNOSIS — E1122 Type 2 diabetes mellitus with diabetic chronic kidney disease: Secondary | ICD-10-CM | POA: Diagnosis not present

## 2018-06-11 DIAGNOSIS — Z89511 Acquired absence of right leg below knee: Secondary | ICD-10-CM | POA: Diagnosis not present

## 2018-06-11 DIAGNOSIS — Z89512 Acquired absence of left leg below knee: Secondary | ICD-10-CM | POA: Diagnosis not present

## 2018-06-11 DIAGNOSIS — D509 Iron deficiency anemia, unspecified: Secondary | ICD-10-CM | POA: Diagnosis not present

## 2018-06-11 DIAGNOSIS — Z794 Long term (current) use of insulin: Secondary | ICD-10-CM | POA: Diagnosis not present

## 2018-06-11 DIAGNOSIS — M6281 Muscle weakness (generalized): Secondary | ICD-10-CM | POA: Diagnosis not present

## 2018-06-11 DIAGNOSIS — E785 Hyperlipidemia, unspecified: Secondary | ICD-10-CM | POA: Diagnosis not present

## 2018-06-12 DIAGNOSIS — M6281 Muscle weakness (generalized): Secondary | ICD-10-CM | POA: Diagnosis not present

## 2018-06-12 DIAGNOSIS — Z89512 Acquired absence of left leg below knee: Secondary | ICD-10-CM | POA: Diagnosis not present

## 2018-06-12 DIAGNOSIS — Z89511 Acquired absence of right leg below knee: Secondary | ICD-10-CM | POA: Diagnosis not present

## 2018-06-15 DIAGNOSIS — Z89512 Acquired absence of left leg below knee: Secondary | ICD-10-CM | POA: Diagnosis not present

## 2018-06-15 DIAGNOSIS — M6281 Muscle weakness (generalized): Secondary | ICD-10-CM | POA: Diagnosis not present

## 2018-06-15 DIAGNOSIS — Z89511 Acquired absence of right leg below knee: Secondary | ICD-10-CM | POA: Diagnosis not present

## 2018-06-16 ENCOUNTER — Ambulatory Visit: Payer: Medicare Other | Admitting: Student

## 2018-06-16 DIAGNOSIS — M6281 Muscle weakness (generalized): Secondary | ICD-10-CM | POA: Diagnosis not present

## 2018-06-16 DIAGNOSIS — Z89511 Acquired absence of right leg below knee: Secondary | ICD-10-CM | POA: Diagnosis not present

## 2018-06-16 DIAGNOSIS — Z89512 Acquired absence of left leg below knee: Secondary | ICD-10-CM | POA: Diagnosis not present

## 2018-06-17 DIAGNOSIS — M6281 Muscle weakness (generalized): Secondary | ICD-10-CM | POA: Diagnosis not present

## 2018-06-17 DIAGNOSIS — Z89512 Acquired absence of left leg below knee: Secondary | ICD-10-CM | POA: Diagnosis not present

## 2018-06-17 DIAGNOSIS — Z89511 Acquired absence of right leg below knee: Secondary | ICD-10-CM | POA: Diagnosis not present

## 2018-06-18 DIAGNOSIS — M6281 Muscle weakness (generalized): Secondary | ICD-10-CM | POA: Diagnosis not present

## 2018-06-18 DIAGNOSIS — Z89511 Acquired absence of right leg below knee: Secondary | ICD-10-CM | POA: Diagnosis not present

## 2018-06-18 DIAGNOSIS — Z89512 Acquired absence of left leg below knee: Secondary | ICD-10-CM | POA: Diagnosis not present

## 2018-06-19 DIAGNOSIS — Z89511 Acquired absence of right leg below knee: Secondary | ICD-10-CM | POA: Diagnosis not present

## 2018-06-19 DIAGNOSIS — Z89512 Acquired absence of left leg below knee: Secondary | ICD-10-CM | POA: Diagnosis not present

## 2018-06-19 DIAGNOSIS — M6281 Muscle weakness (generalized): Secondary | ICD-10-CM | POA: Diagnosis not present

## 2018-06-22 DIAGNOSIS — Z89512 Acquired absence of left leg below knee: Secondary | ICD-10-CM | POA: Diagnosis not present

## 2018-06-22 DIAGNOSIS — Z89511 Acquired absence of right leg below knee: Secondary | ICD-10-CM | POA: Diagnosis not present

## 2018-06-22 DIAGNOSIS — M6281 Muscle weakness (generalized): Secondary | ICD-10-CM | POA: Diagnosis not present

## 2018-06-23 DIAGNOSIS — Z89512 Acquired absence of left leg below knee: Secondary | ICD-10-CM | POA: Diagnosis not present

## 2018-06-23 DIAGNOSIS — Z89511 Acquired absence of right leg below knee: Secondary | ICD-10-CM | POA: Diagnosis not present

## 2018-06-23 DIAGNOSIS — M6281 Muscle weakness (generalized): Secondary | ICD-10-CM | POA: Diagnosis not present

## 2018-06-29 DIAGNOSIS — Z89511 Acquired absence of right leg below knee: Secondary | ICD-10-CM | POA: Diagnosis not present

## 2018-06-29 DIAGNOSIS — I739 Peripheral vascular disease, unspecified: Secondary | ICD-10-CM | POA: Diagnosis not present

## 2018-06-29 DIAGNOSIS — Z8619 Personal history of other infectious and parasitic diseases: Secondary | ICD-10-CM | POA: Diagnosis not present

## 2018-06-29 DIAGNOSIS — G4730 Sleep apnea, unspecified: Secondary | ICD-10-CM | POA: Diagnosis not present

## 2018-07-09 DIAGNOSIS — K5900 Constipation, unspecified: Secondary | ICD-10-CM | POA: Diagnosis not present

## 2018-07-15 ENCOUNTER — Ambulatory Visit: Payer: Medicare Other | Admitting: Student

## 2018-07-15 DIAGNOSIS — Z89511 Acquired absence of right leg below knee: Secondary | ICD-10-CM | POA: Diagnosis not present

## 2018-07-15 DIAGNOSIS — M6281 Muscle weakness (generalized): Secondary | ICD-10-CM | POA: Diagnosis not present

## 2018-07-15 DIAGNOSIS — Z89512 Acquired absence of left leg below knee: Secondary | ICD-10-CM | POA: Diagnosis not present

## 2018-07-15 DIAGNOSIS — R2689 Other abnormalities of gait and mobility: Secondary | ICD-10-CM | POA: Diagnosis not present

## 2018-07-16 DIAGNOSIS — M6281 Muscle weakness (generalized): Secondary | ICD-10-CM | POA: Diagnosis not present

## 2018-07-16 DIAGNOSIS — Z89511 Acquired absence of right leg below knee: Secondary | ICD-10-CM | POA: Diagnosis not present

## 2018-07-16 DIAGNOSIS — Z89512 Acquired absence of left leg below knee: Secondary | ICD-10-CM | POA: Diagnosis not present

## 2018-07-16 DIAGNOSIS — R2689 Other abnormalities of gait and mobility: Secondary | ICD-10-CM | POA: Diagnosis not present

## 2018-07-17 DIAGNOSIS — Z89512 Acquired absence of left leg below knee: Secondary | ICD-10-CM | POA: Diagnosis not present

## 2018-07-17 DIAGNOSIS — Z89511 Acquired absence of right leg below knee: Secondary | ICD-10-CM | POA: Diagnosis not present

## 2018-07-17 DIAGNOSIS — M6281 Muscle weakness (generalized): Secondary | ICD-10-CM | POA: Diagnosis not present

## 2018-07-17 DIAGNOSIS — R2689 Other abnormalities of gait and mobility: Secondary | ICD-10-CM | POA: Diagnosis not present

## 2018-07-20 DIAGNOSIS — R2689 Other abnormalities of gait and mobility: Secondary | ICD-10-CM | POA: Diagnosis not present

## 2018-07-20 DIAGNOSIS — M6281 Muscle weakness (generalized): Secondary | ICD-10-CM | POA: Diagnosis not present

## 2018-07-20 DIAGNOSIS — Z89512 Acquired absence of left leg below knee: Secondary | ICD-10-CM | POA: Diagnosis not present

## 2018-07-20 DIAGNOSIS — Z89511 Acquired absence of right leg below knee: Secondary | ICD-10-CM | POA: Diagnosis not present

## 2018-07-21 DIAGNOSIS — M6281 Muscle weakness (generalized): Secondary | ICD-10-CM | POA: Diagnosis not present

## 2018-07-21 DIAGNOSIS — Z89512 Acquired absence of left leg below knee: Secondary | ICD-10-CM | POA: Diagnosis not present

## 2018-07-21 DIAGNOSIS — R2689 Other abnormalities of gait and mobility: Secondary | ICD-10-CM | POA: Diagnosis not present

## 2018-07-21 DIAGNOSIS — Z89511 Acquired absence of right leg below knee: Secondary | ICD-10-CM | POA: Diagnosis not present

## 2018-07-22 DIAGNOSIS — Z89512 Acquired absence of left leg below knee: Secondary | ICD-10-CM | POA: Diagnosis not present

## 2018-07-22 DIAGNOSIS — R2689 Other abnormalities of gait and mobility: Secondary | ICD-10-CM | POA: Diagnosis not present

## 2018-07-22 DIAGNOSIS — Z89511 Acquired absence of right leg below knee: Secondary | ICD-10-CM | POA: Diagnosis not present

## 2018-07-22 DIAGNOSIS — M6281 Muscle weakness (generalized): Secondary | ICD-10-CM | POA: Diagnosis not present

## 2018-07-23 DIAGNOSIS — D509 Iron deficiency anemia, unspecified: Secondary | ICD-10-CM | POA: Diagnosis not present

## 2018-07-23 DIAGNOSIS — R2689 Other abnormalities of gait and mobility: Secondary | ICD-10-CM | POA: Diagnosis not present

## 2018-07-23 DIAGNOSIS — I5032 Chronic diastolic (congestive) heart failure: Secondary | ICD-10-CM | POA: Diagnosis not present

## 2018-07-23 DIAGNOSIS — M6281 Muscle weakness (generalized): Secondary | ICD-10-CM | POA: Diagnosis not present

## 2018-07-23 DIAGNOSIS — Z89512 Acquired absence of left leg below knee: Secondary | ICD-10-CM | POA: Diagnosis not present

## 2018-07-23 DIAGNOSIS — I2510 Atherosclerotic heart disease of native coronary artery without angina pectoris: Secondary | ICD-10-CM | POA: Diagnosis not present

## 2018-07-23 DIAGNOSIS — Z89511 Acquired absence of right leg below knee: Secondary | ICD-10-CM | POA: Diagnosis not present

## 2018-07-24 DIAGNOSIS — M6281 Muscle weakness (generalized): Secondary | ICD-10-CM | POA: Diagnosis not present

## 2018-07-24 DIAGNOSIS — R2689 Other abnormalities of gait and mobility: Secondary | ICD-10-CM | POA: Diagnosis not present

## 2018-07-24 DIAGNOSIS — Z89512 Acquired absence of left leg below knee: Secondary | ICD-10-CM | POA: Diagnosis not present

## 2018-07-24 DIAGNOSIS — Z89511 Acquired absence of right leg below knee: Secondary | ICD-10-CM | POA: Diagnosis not present

## 2018-07-27 DIAGNOSIS — R2689 Other abnormalities of gait and mobility: Secondary | ICD-10-CM | POA: Diagnosis not present

## 2018-07-27 DIAGNOSIS — M6281 Muscle weakness (generalized): Secondary | ICD-10-CM | POA: Diagnosis not present

## 2018-07-27 DIAGNOSIS — Z89511 Acquired absence of right leg below knee: Secondary | ICD-10-CM | POA: Diagnosis not present

## 2018-07-27 DIAGNOSIS — Z89512 Acquired absence of left leg below knee: Secondary | ICD-10-CM | POA: Diagnosis not present

## 2018-07-28 DIAGNOSIS — M6281 Muscle weakness (generalized): Secondary | ICD-10-CM | POA: Diagnosis not present

## 2018-07-28 DIAGNOSIS — Z89512 Acquired absence of left leg below knee: Secondary | ICD-10-CM | POA: Diagnosis not present

## 2018-07-28 DIAGNOSIS — Z89511 Acquired absence of right leg below knee: Secondary | ICD-10-CM | POA: Diagnosis not present

## 2018-07-28 DIAGNOSIS — R2689 Other abnormalities of gait and mobility: Secondary | ICD-10-CM | POA: Diagnosis not present

## 2018-07-29 ENCOUNTER — Ambulatory Visit: Payer: Medicare Other | Admitting: Cardiology

## 2018-07-29 DIAGNOSIS — Z89512 Acquired absence of left leg below knee: Secondary | ICD-10-CM | POA: Diagnosis not present

## 2018-07-29 DIAGNOSIS — R2689 Other abnormalities of gait and mobility: Secondary | ICD-10-CM | POA: Diagnosis not present

## 2018-07-29 DIAGNOSIS — M6281 Muscle weakness (generalized): Secondary | ICD-10-CM | POA: Diagnosis not present

## 2018-07-29 DIAGNOSIS — Z89511 Acquired absence of right leg below knee: Secondary | ICD-10-CM | POA: Diagnosis not present

## 2018-07-29 NOTE — Assessment & Plan Note (Deleted)
Followed by PCP

## 2018-07-30 DIAGNOSIS — Z89512 Acquired absence of left leg below knee: Secondary | ICD-10-CM | POA: Diagnosis not present

## 2018-07-30 DIAGNOSIS — Z89511 Acquired absence of right leg below knee: Secondary | ICD-10-CM | POA: Diagnosis not present

## 2018-07-30 DIAGNOSIS — M6281 Muscle weakness (generalized): Secondary | ICD-10-CM | POA: Diagnosis not present

## 2018-07-30 DIAGNOSIS — R2689 Other abnormalities of gait and mobility: Secondary | ICD-10-CM | POA: Diagnosis not present

## 2018-07-31 DIAGNOSIS — R2689 Other abnormalities of gait and mobility: Secondary | ICD-10-CM | POA: Diagnosis not present

## 2018-07-31 DIAGNOSIS — Z89511 Acquired absence of right leg below knee: Secondary | ICD-10-CM | POA: Diagnosis not present

## 2018-07-31 DIAGNOSIS — Z89512 Acquired absence of left leg below knee: Secondary | ICD-10-CM | POA: Diagnosis not present

## 2018-07-31 DIAGNOSIS — M6281 Muscle weakness (generalized): Secondary | ICD-10-CM | POA: Diagnosis not present

## 2018-08-03 DIAGNOSIS — Z89512 Acquired absence of left leg below knee: Secondary | ICD-10-CM | POA: Diagnosis not present

## 2018-08-03 DIAGNOSIS — M6281 Muscle weakness (generalized): Secondary | ICD-10-CM | POA: Diagnosis not present

## 2018-08-03 DIAGNOSIS — Z89511 Acquired absence of right leg below knee: Secondary | ICD-10-CM | POA: Diagnosis not present

## 2018-08-03 DIAGNOSIS — R2689 Other abnormalities of gait and mobility: Secondary | ICD-10-CM | POA: Diagnosis not present

## 2018-08-04 DIAGNOSIS — Z89511 Acquired absence of right leg below knee: Secondary | ICD-10-CM | POA: Diagnosis not present

## 2018-08-04 DIAGNOSIS — M6281 Muscle weakness (generalized): Secondary | ICD-10-CM | POA: Diagnosis not present

## 2018-08-04 DIAGNOSIS — Z89512 Acquired absence of left leg below knee: Secondary | ICD-10-CM | POA: Diagnosis not present

## 2018-08-04 DIAGNOSIS — R2689 Other abnormalities of gait and mobility: Secondary | ICD-10-CM | POA: Diagnosis not present

## 2018-08-05 DIAGNOSIS — Z89511 Acquired absence of right leg below knee: Secondary | ICD-10-CM | POA: Diagnosis not present

## 2018-08-05 DIAGNOSIS — M6281 Muscle weakness (generalized): Secondary | ICD-10-CM | POA: Diagnosis not present

## 2018-08-05 DIAGNOSIS — R2689 Other abnormalities of gait and mobility: Secondary | ICD-10-CM | POA: Diagnosis not present

## 2018-08-05 DIAGNOSIS — Z89512 Acquired absence of left leg below knee: Secondary | ICD-10-CM | POA: Diagnosis not present

## 2018-08-06 DIAGNOSIS — R2689 Other abnormalities of gait and mobility: Secondary | ICD-10-CM | POA: Diagnosis not present

## 2018-08-06 DIAGNOSIS — Z89511 Acquired absence of right leg below knee: Secondary | ICD-10-CM | POA: Diagnosis not present

## 2018-08-06 DIAGNOSIS — Z89512 Acquired absence of left leg below knee: Secondary | ICD-10-CM | POA: Diagnosis not present

## 2018-08-06 DIAGNOSIS — M6281 Muscle weakness (generalized): Secondary | ICD-10-CM | POA: Diagnosis not present

## 2018-08-07 DIAGNOSIS — M6281 Muscle weakness (generalized): Secondary | ICD-10-CM | POA: Diagnosis not present

## 2018-08-07 DIAGNOSIS — R2689 Other abnormalities of gait and mobility: Secondary | ICD-10-CM | POA: Diagnosis not present

## 2018-08-07 DIAGNOSIS — Z89511 Acquired absence of right leg below knee: Secondary | ICD-10-CM | POA: Diagnosis not present

## 2018-08-07 DIAGNOSIS — Z89512 Acquired absence of left leg below knee: Secondary | ICD-10-CM | POA: Diagnosis not present

## 2018-08-10 DIAGNOSIS — M6281 Muscle weakness (generalized): Secondary | ICD-10-CM | POA: Diagnosis not present

## 2018-08-10 DIAGNOSIS — Z89511 Acquired absence of right leg below knee: Secondary | ICD-10-CM | POA: Diagnosis not present

## 2018-08-10 DIAGNOSIS — Z89512 Acquired absence of left leg below knee: Secondary | ICD-10-CM | POA: Diagnosis not present

## 2018-08-10 DIAGNOSIS — R2689 Other abnormalities of gait and mobility: Secondary | ICD-10-CM | POA: Diagnosis not present

## 2018-08-11 DIAGNOSIS — R2689 Other abnormalities of gait and mobility: Secondary | ICD-10-CM | POA: Diagnosis not present

## 2018-08-11 DIAGNOSIS — M6281 Muscle weakness (generalized): Secondary | ICD-10-CM | POA: Diagnosis not present

## 2018-08-11 DIAGNOSIS — Z89512 Acquired absence of left leg below knee: Secondary | ICD-10-CM | POA: Diagnosis not present

## 2018-08-11 DIAGNOSIS — Z89511 Acquired absence of right leg below knee: Secondary | ICD-10-CM | POA: Diagnosis not present

## 2018-08-12 DIAGNOSIS — Z89511 Acquired absence of right leg below knee: Secondary | ICD-10-CM | POA: Diagnosis not present

## 2018-08-12 DIAGNOSIS — R2689 Other abnormalities of gait and mobility: Secondary | ICD-10-CM | POA: Diagnosis not present

## 2018-08-12 DIAGNOSIS — Z89512 Acquired absence of left leg below knee: Secondary | ICD-10-CM | POA: Diagnosis not present

## 2018-08-12 DIAGNOSIS — M6281 Muscle weakness (generalized): Secondary | ICD-10-CM | POA: Diagnosis not present

## 2018-08-13 DIAGNOSIS — R2689 Other abnormalities of gait and mobility: Secondary | ICD-10-CM | POA: Diagnosis not present

## 2018-08-13 DIAGNOSIS — Z89511 Acquired absence of right leg below knee: Secondary | ICD-10-CM | POA: Diagnosis not present

## 2018-08-13 DIAGNOSIS — M6281 Muscle weakness (generalized): Secondary | ICD-10-CM | POA: Diagnosis not present

## 2018-08-13 DIAGNOSIS — Z89512 Acquired absence of left leg below knee: Secondary | ICD-10-CM | POA: Diagnosis not present

## 2018-08-14 DIAGNOSIS — Z89512 Acquired absence of left leg below knee: Secondary | ICD-10-CM | POA: Diagnosis not present

## 2018-08-14 DIAGNOSIS — Z89511 Acquired absence of right leg below knee: Secondary | ICD-10-CM | POA: Diagnosis not present

## 2018-08-14 DIAGNOSIS — R2689 Other abnormalities of gait and mobility: Secondary | ICD-10-CM | POA: Diagnosis not present

## 2018-08-14 DIAGNOSIS — M6281 Muscle weakness (generalized): Secondary | ICD-10-CM | POA: Diagnosis not present

## 2018-08-17 DIAGNOSIS — R2689 Other abnormalities of gait and mobility: Secondary | ICD-10-CM | POA: Diagnosis not present

## 2018-08-17 DIAGNOSIS — Z89512 Acquired absence of left leg below knee: Secondary | ICD-10-CM | POA: Diagnosis not present

## 2018-08-17 DIAGNOSIS — Z89511 Acquired absence of right leg below knee: Secondary | ICD-10-CM | POA: Diagnosis not present

## 2018-08-17 DIAGNOSIS — M6281 Muscle weakness (generalized): Secondary | ICD-10-CM | POA: Diagnosis not present

## 2018-08-18 DIAGNOSIS — R2689 Other abnormalities of gait and mobility: Secondary | ICD-10-CM | POA: Diagnosis not present

## 2018-08-18 DIAGNOSIS — Z89511 Acquired absence of right leg below knee: Secondary | ICD-10-CM | POA: Diagnosis not present

## 2018-08-18 DIAGNOSIS — M6281 Muscle weakness (generalized): Secondary | ICD-10-CM | POA: Diagnosis not present

## 2018-08-18 DIAGNOSIS — Z89512 Acquired absence of left leg below knee: Secondary | ICD-10-CM | POA: Diagnosis not present

## 2018-08-19 DIAGNOSIS — Z89511 Acquired absence of right leg below knee: Secondary | ICD-10-CM | POA: Diagnosis not present

## 2018-08-19 DIAGNOSIS — Z89512 Acquired absence of left leg below knee: Secondary | ICD-10-CM | POA: Diagnosis not present

## 2018-08-19 DIAGNOSIS — R2689 Other abnormalities of gait and mobility: Secondary | ICD-10-CM | POA: Diagnosis not present

## 2018-08-19 DIAGNOSIS — M6281 Muscle weakness (generalized): Secondary | ICD-10-CM | POA: Diagnosis not present

## 2018-08-20 DIAGNOSIS — Z89511 Acquired absence of right leg below knee: Secondary | ICD-10-CM | POA: Diagnosis not present

## 2018-08-20 DIAGNOSIS — Z89512 Acquired absence of left leg below knee: Secondary | ICD-10-CM | POA: Diagnosis not present

## 2018-08-20 DIAGNOSIS — M6281 Muscle weakness (generalized): Secondary | ICD-10-CM | POA: Diagnosis not present

## 2018-08-20 DIAGNOSIS — R2689 Other abnormalities of gait and mobility: Secondary | ICD-10-CM | POA: Diagnosis not present

## 2018-08-21 ENCOUNTER — Ambulatory Visit: Payer: Medicare Other | Admitting: Student

## 2018-08-21 DIAGNOSIS — M6281 Muscle weakness (generalized): Secondary | ICD-10-CM | POA: Diagnosis not present

## 2018-08-21 DIAGNOSIS — Z89511 Acquired absence of right leg below knee: Secondary | ICD-10-CM | POA: Diagnosis not present

## 2018-08-21 DIAGNOSIS — Z89512 Acquired absence of left leg below knee: Secondary | ICD-10-CM | POA: Diagnosis not present

## 2018-08-21 DIAGNOSIS — R2689 Other abnormalities of gait and mobility: Secondary | ICD-10-CM | POA: Diagnosis not present

## 2018-08-21 NOTE — Progress Notes (Deleted)
Cardiology Office Note    Date:  08/21/2018   ID:  Cory Weber, DOB 1951/01/13, MRN [id number]  PCP:  Iona Beard, MD  Cardiologist: Kate Sable, MD    No chief complaint on file.   History of Present Illness:    Cory Weber is a 68 y.o. male with past medical history of Hepatitis C, HTN, HLD, IDDM, prior CVA, and PVD (s/p left fem-pop bypass which required left BKA in 08/2015 due to maggot infestation and right BMA in 08/2016) who presents to the office today for 71-month follow-up.   He was last examined by Dr. Bronson Ing in 03/2018 as a new patient referral for chest pain. He reported having symptoms for months which could sometimes subside with positional changes. Given his multiple cardiac risk factors a Lexiscan Myoview for ordered for ischemic evaluation. This showed a medium sized, mild intensity, fixed inferior/inferoseptal defect that was most consistent with soft tissue attenuation versus scar and no definite ischemic territories. Was read as a high risk study due to calculated EF of 33%. An echocardiogram was obtained for further assessment and showed a preserved EF of 55-60% with no regional WMA and no significant valvular abnormalities.     Past Medical History:  Diagnosis Date  . Anemia   . Asthma   . BPH (benign prostatic hyperplasia)   . Cellulitis and abscess of foot 07/2015  . Chronic diastolic heart failure ([place])    per Nursing facility  . Chronic kidney disease    CKD stage 3  . CVA (cerebral vascular accident) ([place]) 2015   denies residual on 08/15/2015  . GERD (gastroesophageal reflux disease)   . Hepatitis C    states he was diagnosed in 2007 or 2007 while living in [place], [place]  . Hepatitis C   . Hyperlipidemia   . Hypertension   . Ischemia of foot 05/31/2016  . Osteomyelitis ([place])    per Nursing Facility  . Peripheral neuropathy   . Pneumonia X 1  . PVD (peripheral vascular disease) ([place])   . Type II diabetes mellitus ([place])   .  Wet gangrene ([place]) 06/10/2016    Past Surgical History:  Procedure Laterality Date  . ABDOMINAL AORTOGRAM N/A 06/03/2016   Procedure: Abdominal Aortogram;  Surgeon: Waynetta Sandy, MD;  Location: [facility];  Service: Cardiovascular;  Laterality: N/A;  . AMPUTATION Left 08/16/2015   Procedure: LEFT BELOW THE KNEE AMPUTATION ;  Surgeon: Serafina Mitchell, MD;  Location: [facility];  Service: Vascular;  Laterality: Left;  . AMPUTATION Right 08/22/2016   Procedure: AMPUTATION BELOW KNEE-RIGHT;  Surgeon: Waynetta Sandy, MD;  Location: [facility];  Service: Vascular;  Laterality: Right;  . BACK SURGERY    . BELOW KNEE LEG AMPUTATION Left 08/16/2015  . BIOPSY N/A 09/03/2012   Procedure: BIOPSY;  Surgeon: Daneil Dolin, MD;  Location: [facility];  Service: Endoscopy;  Laterality: N/A;  gastric and gastric mucosa  . BIOPSY N/A 12/03/2012   Procedure: BIOPSY;  Surgeon: Daneil Dolin, MD;  Location: [facility];  Service: Endoscopy;  Laterality: N/A;  . COLONOSCOPY WITH PROPOFOL N/A 09/03/2012   ZWC:HENIDPO polyp-removed as outlined above. Prominent internal hemorrhoids. Tubular adenoma  . ESOPHAGOGASTRODUODENOSCOPY (EGD) WITH PROPOFOL N/A 09/03/2012   EUM:PNTIRW hernia. Gastric diverticulum. Gastric ulcers with associated erosions. Duodenal erosions. Status post gastric biopsy. H.PYLORI gastritis   . ESOPHAGOGASTRODUODENOSCOPY (EGD) WITH PROPOFOL N/A 12/03/2012   Dr. Gala Weber: gastric diverticulum, gastric erosions and scar. Previously noted gastric ulcer  completed healed. Biopsy without H.pylori.   . ESOPHAGOGASTRODUODENOSCOPY (EGD) WITH PROPOFOL N/A 01/23/2016   Procedure: ESOPHAGOGASTRODUODENOSCOPY (EGD) WITH PROPOFOL;  Surgeon: Cory Stabler, MD;  Location: [facility];  Service: Gastroenterology;  Laterality: N/A;  . I&D EXTREMITY Right 07/11/2016   Procedure: DELAY PRIMARY CLOSURE FOOT AMPUTATION;  Surgeon: Edrick Kins, DPM;  Location: [facility];  Service: Podiatry;  Laterality: Right;  .  LIVER BIOPSY  2005   Done in [place], [place]. Chronic hepatitis with mild periportal inflammation, lobular unicellular necrosis and portal fibrosis. Grade 2, stage 1-2.  . LOWER EXTREMITY ANGIOGRAPHY Right 06/03/2016   Procedure: Lower Extremity Angiography;  Surgeon: Waynetta Sandy, MD;  Location: [facility];  Service: Cardiovascular;  Laterality: Right;  . MAXIMUM ACCESS (MAS)POSTERIOR LUMBAR INTERBODY FUSION (PLIF) 1 LEVEL N/A 11/25/2013   Procedure: FOR MAXIMUM ACCESS (MAS) POSTERIOR LUMBAR INTERBODY FUSION (PLIF) 1 LEVEL;  Surgeon: Cory Moore, MD;  Location: [facility];  Service: Neurosurgery;  Laterality: N/A;  FOR MAXIMUM ACCESS (MAS) POSTERIOR LUMBAR INTERBODY FUSION (PLIF) 1 LEVEL LUMBAR 3-4  . PERIPHERAL VASCULAR ATHERECTOMY Right 06/03/2016   Procedure: Peripheral Vascular Atherectomy;  Surgeon: Waynetta Sandy, MD;  Location: [facility];  Service: Cardiovascular;  Laterality: Right;  SFA WITH STENT  . PERIPHERAL VASCULAR CATHETERIZATION Left 08/01/2015   Procedure: Lower Extremity Angiography;  Surgeon: Serafina Mitchell, MD;  Location: [facility];  Service: Cardiovascular;  Laterality: Left;  . PERIPHERAL VASCULAR CATHETERIZATION N/A 08/01/2015   Procedure: Abdominal Aortogram;  Surgeon: Serafina Mitchell, MD;  Location: [facility];  Service: Cardiovascular;  Laterality: N/A;  . PERIPHERAL VASCULAR CATHETERIZATION N/A 08/08/2015   Procedure: Abdominal Aortogram w/Lower Extremity;  Surgeon: Serafina Mitchell, MD;  Location: [facility];  Service: Cardiovascular;  Laterality: N/A;  . PERIPHERAL VASCULAR CATHETERIZATION Left 08/08/2015   Procedure: Peripheral Vascular Balloon Angioplasty;  Surgeon: Serafina Mitchell, MD;  Location: [facility];  Service: Cardiovascular;  Laterality: Left;  left popiteal artery, left peronealtrunk, left post tibial  . POLYPECTOMY N/A 09/03/2012   Procedure: POLYPECTOMY;  Surgeon: Daneil Dolin, MD;   [facility];  Service: Endoscopy;  Laterality: N/A;  cecal polyp  . TONSILLECTOMY    . TRANSMETATARSAL AMPUTATION Right 06/13/2016   Procedure: RIGHT TRANSMETATARSAL AMPUTATION;  Surgeon: Waynetta Sandy, MD;  Location: [facility];  Service: Vascular;  Laterality: Right;  . TRANSMETATARSAL AMPUTATION Right 07/10/2016   Procedure: REVISION RIGHT TRANSMETATARSAL AMPUTATION;  Surgeon: Waynetta Sandy, MD;  Location: [facility];  Service: Vascular;  Laterality: Right;    Current Medications: Outpatient Medications Prior to Visit  Medication Sig Dispense Refill  . acetaminophen (TYLENOL) 325 MG tablet Take 650 mg by mouth every 4 (four) hours as needed for fever. For temp greater than 100.4    . allopurinol (ZYLOPRIM) 100 MG tablet Take 200 mg by mouth every evening.    . Amino Acids-Protein Hydrolys (FEEDING SUPPLEMENT, PRO-STAT SUGAR FREE 64,) LIQD Take 30 mLs by mouth 2 (two) times daily. 900 mL 0  . aspirin EC 81 MG tablet Take 81 mg by mouth daily.    . carvedilol (COREG) 3.125 MG tablet Take 1 tablet (3.125 mg total) by mouth 2 (two) times daily with a meal. 60 tablet 0  . Cholecalciferol (VITAMIN D3) 2000 units TABS Take 1 tablet by mouth daily.    . clopidogrel (PLAVIX) 75 MG tablet Take 1 tablet (75 mg total) by mouth daily  with breakfast. 30 tablet 0  . ferrous sulfate 325 (65 FE) MG tablet Take 325 mg by mouth 2 (two) times daily.     Marland Kitchen gabapentin (NEURONTIN) 600 MG tablet Take 1 tablet (600 mg total) by mouth 3 (three) times daily.    . insulin aspart (NOVOLOG) 100 UNIT/ML injection Inject 5 Units into the skin 3 (three) times daily after meals. (Patient taking differently: Inject 2-8 Units into the skin as directed. Inject 5 units 3 times daily after meals.  Inject 3 times daily before meals as per sliding scale: 150-200= 2 units, 201-250= 4 units, 251-300= 6 units, 301-350= 8 units, if >251 notify MD)    . insulin glargine (LANTUS) 100 UNIT/ML injection Inject 0.25 mLs (25  Units total) into the skin at bedtime. (Patient taking differently: Inject 20 Units into the skin at bedtime. )    . lisinopril (PRINIVIL,ZESTRIL) 5 MG tablet Take 5 mg by mouth daily.     . magnesium hydroxide (MILK OF MAGNESIA) 400 MG/5ML suspension Take 30 mLs by mouth daily as needed for mild constipation.    . metFORMIN (GLUCOPHAGE) 500 MG tablet Take 500 mg by mouth daily.     . polyethylene glycol (MIRALAX / GLYCOLAX) packet Take 17 g by mouth 2 (two) times daily. (Patient taking differently: Take 17 g by mouth every 12 (twelve) hours as needed for mild constipation. ) 14 each 0  . Probiotic CAPS Take 1 capsule by mouth 2 (two) times daily.    Marland Kitchen senna-docusate (SENOKOT-S) 8.6-50 MG tablet Take 1 tablet by mouth 2 (two) times daily. (Patient taking differently: Take 1 tablet by mouth 2 (two) times daily. Hold for loose stools)    . simvastatin (ZOCOR) 20 MG tablet Take 20 mg by mouth daily.     . tamsulosin (FLOMAX) 0.4 MG CAPS capsule Take 1 capsule (0.4 mg total) by mouth daily. 30 capsule 0  . torsemide (DEMADEX) 20 MG tablet Take 20 mg by mouth daily.     . vitamin C (ASCORBIC ACID) 500 MG tablet Take 500 mg by mouth 2 (two) times daily.    . Vitamin D, Ergocalciferol, (DRISDOL) 50000 units CAPS capsule Take 50,000 Units by mouth every Friday. Takes every Friday      No facility-administered medications prior to visit.      Allergies:   No known allergies   Social History   Socioeconomic History  . Marital status: Divorced    Spouse name: Not on file  . Number of children: 1  . Years of education: Not on file  . Highest education level: Not on file  Occupational History  . Occupation: disabled  Social Needs  . Financial resource strain: Not on file  . Food insecurity    Worry: Not on file    Inability: Not on file  . Transportation needs    Medical: Not on file    Non-medical: Not on file  Tobacco Use  . Smoking status: Former Smoker    Packs/day: 1.00    Years: 47.00     Pack years: 47.00    Types: Cigarettes    Quit date: 07/13/2015    Years since quitting: 3.1  . Smokeless tobacco: Never Used  Substance and Sexual Activity  . Alcohol use: No    Alcohol/week: 0.0 standard drinks    Comment:  none 08/15/2015 "stopped drinking 3-4 years ago"  . Drug use: No    Comment: 08/15/2015 "last used cocaine 3-4 months ago"  . Sexual  activity: Never    Birth control/protection: Condom  Lifestyle  . Physical activity    Days per week: Not on file    Minutes per session: Not on file  . Stress: Not on file  Relationships  . Social Herbalist on phone: Not on file    Gets together: Not on file    Attends religious service: Not on file    Active member of club or organization: Not on file    Attends meetings of clubs or organizations: Not on file    Relationship status: Not on file  Other Topics Concern  . Not on file  Social History Narrative  . Not on file     Family History:  The patient's ***family history includes Breast cancer in his mother and sister; Diabetes in his father and sister; Heart disease in his father; Heart failure in his mother; Hyperlipidemia in his father; Hypertension in his father and sister.   Review of Systems:   Please see the history of present illness.     General:  No chills, fever, night sweats or weight changes.  Cardiovascular:  No chest pain, dyspnea on exertion, edema, orthopnea, palpitations, paroxysmal nocturnal dyspnea. Dermatological: No rash, lesions/masses Respiratory: No cough, dyspnea Urologic: No hematuria, dysuria Abdominal:   No nausea, vomiting, diarrhea, bright red blood per rectum, melena, or hematemesis Neurologic:  No visual changes, wkns, changes in mental status. All other systems reviewed and are otherwise negative except as noted above.   Physical Exam:    VS:  There were no vitals taken for this visit.   General: Well developed, well nourished,male appearing in no acute distress.  Head: Normocephalic, atraumatic, sclera non-icteric, no xanthomas, nares are without discharge.  Neck: No carotid bruits. JVD not elevated.  Lungs: Respirations regular and unlabored, without wheezes or rales.  Heart: ***Regular rate and rhythm. No S3 or S4.  No murmur, no rubs, or gallops appreciated. Abdomen: Soft, non-tender, non-distended with normoactive bowel sounds. No hepatomegaly. No rebound/guarding. No obvious abdominal masses. Msk:  Strength and tone appear normal for age. No joint deformities or effusions. Extremities: No clubbing or cyanosis. No edema.  Distal pedal pulses are 2+ bilaterally. Neuro: Alert and oriented X 3. Moves all extremities spontaneously. No focal deficits noted. Psych:  Responds to questions appropriately with a normal affect. Skin: No rashes or lesions noted  Wt Readings from Last 3 Encounters:  02/05/18 205 lb (93 kg)  01/14/17 205 lb (93 kg)  12/06/16 212 lb (96.2 kg)        Studies/Labs Reviewed:   EKG:  EKG is*** ordered today.  The ekg ordered today demonstrates ***  Recent Labs: No results found for requested labs within last 8760 hours.   Lipid Panel    Component Value Date/Time   CHOL 142 11/29/2015   TRIG 75 11/29/2015   HDL 43 11/29/2015   CHOLHDL 2.9 11/24/2013 0335   VLDL 21 11/24/2013 0335   LDLCALC 84 11/29/2015    Additional studies/ records that were reviewed today include:   NST: 03/2018  No diagnostic ST segment changes to indicate ischemia.  Medium sized, mild intensity, fixed inferior/inferoseptal defect that is most consistent with soft tissue attenuation versus scar. No definite ischemic territories.  This is a high risk study based on reduced LVEF. Consider echocardiogram for further assessment.  Nuclear stress EF: 33%.  Echocardiogram: 03/2018 IMPRESSIONS   1. The left ventricle has normal systolic function, with an ejection fraction of 55-60%. The  cavity size was normal. There is mildly increased left  ventricular wall thickness. Left ventricular diastolic Doppler parameters are indeterminate Indeterminent  filling pressures.  2. The mitral valve is normal in structure. Moderate thickening of the mitral valve leaflet. No evidence of mitral valve stenosis.  3. The aortic valve has an indeterminant number of cusps. Cannot exclude bicuspid aortic valve. Moderately thickened valve  4. The aortic root is normal in size and structure.  5. The right ventricle has normal systolic function. The cavity was normal. There is no increase in right ventricular wall thickness.  6. The tricuspid valve is normal in structure.  7. Pulmonary hypertension is indeterminant, inadequate TR jet.  8. The interatrial septum was not well visualized.  Assessment:    No diagnosis found.   Plan:   In order of problems listed above:  1. ***    Medication Adjustments/Labs and Tests Ordered: Current medicines are reviewed at length with the patient today.  Concerns regarding medicines are outlined above.  Medication changes, Labs and Tests ordered today are listed in the Patient Instructions below. There are no Patient Instructions on file for this visit.   Signed, Erma Heritage, PA-C  08/21/2018 10:41 AM    [facility] [address] [place], [place] [address] Phone: [phone] Fax: [phone]

## 2018-08-24 DIAGNOSIS — R2689 Other abnormalities of gait and mobility: Secondary | ICD-10-CM | POA: Diagnosis not present

## 2018-08-24 DIAGNOSIS — M6281 Muscle weakness (generalized): Secondary | ICD-10-CM | POA: Diagnosis not present

## 2018-08-24 DIAGNOSIS — Z89512 Acquired absence of left leg below knee: Secondary | ICD-10-CM | POA: Diagnosis not present

## 2018-08-24 DIAGNOSIS — Z89511 Acquired absence of right leg below knee: Secondary | ICD-10-CM | POA: Diagnosis not present

## 2018-08-25 DIAGNOSIS — R2689 Other abnormalities of gait and mobility: Secondary | ICD-10-CM | POA: Diagnosis not present

## 2018-08-25 DIAGNOSIS — Z89512 Acquired absence of left leg below knee: Secondary | ICD-10-CM | POA: Diagnosis not present

## 2018-08-25 DIAGNOSIS — Z89511 Acquired absence of right leg below knee: Secondary | ICD-10-CM | POA: Diagnosis not present

## 2018-08-25 DIAGNOSIS — M6281 Muscle weakness (generalized): Secondary | ICD-10-CM | POA: Diagnosis not present

## 2018-08-26 DIAGNOSIS — M6281 Muscle weakness (generalized): Secondary | ICD-10-CM | POA: Diagnosis not present

## 2018-08-26 DIAGNOSIS — R2689 Other abnormalities of gait and mobility: Secondary | ICD-10-CM | POA: Diagnosis not present

## 2018-08-26 DIAGNOSIS — Z89512 Acquired absence of left leg below knee: Secondary | ICD-10-CM | POA: Diagnosis not present

## 2018-08-26 DIAGNOSIS — Z89511 Acquired absence of right leg below knee: Secondary | ICD-10-CM | POA: Diagnosis not present

## 2018-08-27 DIAGNOSIS — Z89512 Acquired absence of left leg below knee: Secondary | ICD-10-CM | POA: Diagnosis not present

## 2018-08-27 DIAGNOSIS — Z89511 Acquired absence of right leg below knee: Secondary | ICD-10-CM | POA: Diagnosis not present

## 2018-08-27 DIAGNOSIS — M6281 Muscle weakness (generalized): Secondary | ICD-10-CM | POA: Diagnosis not present

## 2018-08-27 DIAGNOSIS — R2689 Other abnormalities of gait and mobility: Secondary | ICD-10-CM | POA: Diagnosis not present

## 2018-08-28 DIAGNOSIS — Z89512 Acquired absence of left leg below knee: Secondary | ICD-10-CM | POA: Diagnosis not present

## 2018-08-28 DIAGNOSIS — Z89511 Acquired absence of right leg below knee: Secondary | ICD-10-CM | POA: Diagnosis not present

## 2018-08-28 DIAGNOSIS — R2689 Other abnormalities of gait and mobility: Secondary | ICD-10-CM | POA: Diagnosis not present

## 2018-08-28 DIAGNOSIS — M6281 Muscle weakness (generalized): Secondary | ICD-10-CM | POA: Diagnosis not present

## 2018-08-31 DIAGNOSIS — Z89511 Acquired absence of right leg below knee: Secondary | ICD-10-CM | POA: Diagnosis not present

## 2018-08-31 DIAGNOSIS — R2689 Other abnormalities of gait and mobility: Secondary | ICD-10-CM | POA: Diagnosis not present

## 2018-08-31 DIAGNOSIS — Z89512 Acquired absence of left leg below knee: Secondary | ICD-10-CM | POA: Diagnosis not present

## 2018-08-31 DIAGNOSIS — M6281 Muscle weakness (generalized): Secondary | ICD-10-CM | POA: Diagnosis not present

## 2018-09-01 DIAGNOSIS — R2689 Other abnormalities of gait and mobility: Secondary | ICD-10-CM | POA: Diagnosis not present

## 2018-09-01 DIAGNOSIS — Z89511 Acquired absence of right leg below knee: Secondary | ICD-10-CM | POA: Diagnosis not present

## 2018-09-01 DIAGNOSIS — Z89512 Acquired absence of left leg below knee: Secondary | ICD-10-CM | POA: Diagnosis not present

## 2018-09-01 DIAGNOSIS — M6281 Muscle weakness (generalized): Secondary | ICD-10-CM | POA: Diagnosis not present

## 2018-09-02 DIAGNOSIS — M6281 Muscle weakness (generalized): Secondary | ICD-10-CM | POA: Diagnosis not present

## 2018-09-02 DIAGNOSIS — Z89511 Acquired absence of right leg below knee: Secondary | ICD-10-CM | POA: Diagnosis not present

## 2018-09-02 DIAGNOSIS — Z89512 Acquired absence of left leg below knee: Secondary | ICD-10-CM | POA: Diagnosis not present

## 2018-09-02 DIAGNOSIS — R2689 Other abnormalities of gait and mobility: Secondary | ICD-10-CM | POA: Diagnosis not present

## 2018-09-03 DIAGNOSIS — Z89511 Acquired absence of right leg below knee: Secondary | ICD-10-CM | POA: Diagnosis not present

## 2018-09-03 DIAGNOSIS — Z89512 Acquired absence of left leg below knee: Secondary | ICD-10-CM | POA: Diagnosis not present

## 2018-09-03 DIAGNOSIS — M6281 Muscle weakness (generalized): Secondary | ICD-10-CM | POA: Diagnosis not present

## 2018-09-03 DIAGNOSIS — R2689 Other abnormalities of gait and mobility: Secondary | ICD-10-CM | POA: Diagnosis not present

## 2018-09-04 DIAGNOSIS — Z89511 Acquired absence of right leg below knee: Secondary | ICD-10-CM | POA: Diagnosis not present

## 2018-09-04 DIAGNOSIS — M6281 Muscle weakness (generalized): Secondary | ICD-10-CM | POA: Diagnosis not present

## 2018-09-04 DIAGNOSIS — R2689 Other abnormalities of gait and mobility: Secondary | ICD-10-CM | POA: Diagnosis not present

## 2018-09-04 DIAGNOSIS — Z89512 Acquired absence of left leg below knee: Secondary | ICD-10-CM | POA: Diagnosis not present

## 2018-09-07 DIAGNOSIS — R2689 Other abnormalities of gait and mobility: Secondary | ICD-10-CM | POA: Diagnosis not present

## 2018-09-07 DIAGNOSIS — Z89512 Acquired absence of left leg below knee: Secondary | ICD-10-CM | POA: Diagnosis not present

## 2018-09-07 DIAGNOSIS — Z89511 Acquired absence of right leg below knee: Secondary | ICD-10-CM | POA: Diagnosis not present

## 2018-09-07 DIAGNOSIS — M6281 Muscle weakness (generalized): Secondary | ICD-10-CM | POA: Diagnosis not present

## 2018-09-08 DIAGNOSIS — Z89512 Acquired absence of left leg below knee: Secondary | ICD-10-CM | POA: Diagnosis not present

## 2018-09-08 DIAGNOSIS — M6281 Muscle weakness (generalized): Secondary | ICD-10-CM | POA: Diagnosis not present

## 2018-09-08 DIAGNOSIS — R2689 Other abnormalities of gait and mobility: Secondary | ICD-10-CM | POA: Diagnosis not present

## 2018-09-08 DIAGNOSIS — Z89511 Acquired absence of right leg below knee: Secondary | ICD-10-CM | POA: Diagnosis not present

## 2018-09-09 DIAGNOSIS — R2689 Other abnormalities of gait and mobility: Secondary | ICD-10-CM | POA: Diagnosis not present

## 2018-09-09 DIAGNOSIS — M6281 Muscle weakness (generalized): Secondary | ICD-10-CM | POA: Diagnosis not present

## 2018-09-09 DIAGNOSIS — Z89512 Acquired absence of left leg below knee: Secondary | ICD-10-CM | POA: Diagnosis not present

## 2018-09-09 DIAGNOSIS — Z89511 Acquired absence of right leg below knee: Secondary | ICD-10-CM | POA: Diagnosis not present

## 2018-09-10 DIAGNOSIS — R2689 Other abnormalities of gait and mobility: Secondary | ICD-10-CM | POA: Diagnosis not present

## 2018-09-10 DIAGNOSIS — Z89512 Acquired absence of left leg below knee: Secondary | ICD-10-CM | POA: Diagnosis not present

## 2018-09-10 DIAGNOSIS — M6281 Muscle weakness (generalized): Secondary | ICD-10-CM | POA: Diagnosis not present

## 2018-09-10 DIAGNOSIS — Z89511 Acquired absence of right leg below knee: Secondary | ICD-10-CM | POA: Diagnosis not present

## 2018-09-11 DIAGNOSIS — Z89511 Acquired absence of right leg below knee: Secondary | ICD-10-CM | POA: Diagnosis not present

## 2018-09-11 DIAGNOSIS — M6281 Muscle weakness (generalized): Secondary | ICD-10-CM | POA: Diagnosis not present

## 2018-09-11 DIAGNOSIS — R2689 Other abnormalities of gait and mobility: Secondary | ICD-10-CM | POA: Diagnosis not present

## 2018-09-11 DIAGNOSIS — Z89512 Acquired absence of left leg below knee: Secondary | ICD-10-CM | POA: Diagnosis not present

## 2018-09-14 DIAGNOSIS — R2689 Other abnormalities of gait and mobility: Secondary | ICD-10-CM | POA: Diagnosis not present

## 2018-09-14 DIAGNOSIS — M6281 Muscle weakness (generalized): Secondary | ICD-10-CM | POA: Diagnosis not present

## 2018-09-14 DIAGNOSIS — Z89512 Acquired absence of left leg below knee: Secondary | ICD-10-CM | POA: Diagnosis not present

## 2018-09-14 DIAGNOSIS — Z89511 Acquired absence of right leg below knee: Secondary | ICD-10-CM | POA: Diagnosis not present

## 2018-09-15 DIAGNOSIS — R2689 Other abnormalities of gait and mobility: Secondary | ICD-10-CM | POA: Diagnosis not present

## 2018-09-15 DIAGNOSIS — M6281 Muscle weakness (generalized): Secondary | ICD-10-CM | POA: Diagnosis not present

## 2018-09-15 DIAGNOSIS — Z89511 Acquired absence of right leg below knee: Secondary | ICD-10-CM | POA: Diagnosis not present

## 2018-09-15 DIAGNOSIS — Z89512 Acquired absence of left leg below knee: Secondary | ICD-10-CM | POA: Diagnosis not present

## 2018-09-16 DIAGNOSIS — M6281 Muscle weakness (generalized): Secondary | ICD-10-CM | POA: Diagnosis not present

## 2018-09-16 DIAGNOSIS — R2689 Other abnormalities of gait and mobility: Secondary | ICD-10-CM | POA: Diagnosis not present

## 2018-09-16 DIAGNOSIS — Z89512 Acquired absence of left leg below knee: Secondary | ICD-10-CM | POA: Diagnosis not present

## 2018-09-16 DIAGNOSIS — Z89511 Acquired absence of right leg below knee: Secondary | ICD-10-CM | POA: Diagnosis not present

## 2018-09-17 DIAGNOSIS — R2689 Other abnormalities of gait and mobility: Secondary | ICD-10-CM | POA: Diagnosis not present

## 2018-09-17 DIAGNOSIS — Z89511 Acquired absence of right leg below knee: Secondary | ICD-10-CM | POA: Diagnosis not present

## 2018-09-17 DIAGNOSIS — Z89512 Acquired absence of left leg below knee: Secondary | ICD-10-CM | POA: Diagnosis not present

## 2018-09-17 DIAGNOSIS — M6281 Muscle weakness (generalized): Secondary | ICD-10-CM | POA: Diagnosis not present

## 2018-09-18 DIAGNOSIS — Z89512 Acquired absence of left leg below knee: Secondary | ICD-10-CM | POA: Diagnosis not present

## 2018-09-18 DIAGNOSIS — Z89511 Acquired absence of right leg below knee: Secondary | ICD-10-CM | POA: Diagnosis not present

## 2018-09-18 DIAGNOSIS — M6281 Muscle weakness (generalized): Secondary | ICD-10-CM | POA: Diagnosis not present

## 2018-09-18 DIAGNOSIS — R2689 Other abnormalities of gait and mobility: Secondary | ICD-10-CM | POA: Diagnosis not present

## 2018-09-21 DIAGNOSIS — R2689 Other abnormalities of gait and mobility: Secondary | ICD-10-CM | POA: Diagnosis not present

## 2018-09-21 DIAGNOSIS — Z89511 Acquired absence of right leg below knee: Secondary | ICD-10-CM | POA: Diagnosis not present

## 2018-09-21 DIAGNOSIS — Z89512 Acquired absence of left leg below knee: Secondary | ICD-10-CM | POA: Diagnosis not present

## 2018-09-21 DIAGNOSIS — M6281 Muscle weakness (generalized): Secondary | ICD-10-CM | POA: Diagnosis not present

## 2018-09-22 ENCOUNTER — Other Ambulatory Visit: Payer: Self-pay

## 2018-09-22 ENCOUNTER — Encounter: Payer: Self-pay | Admitting: Student

## 2018-09-22 ENCOUNTER — Telehealth (INDEPENDENT_AMBULATORY_CARE_PROVIDER_SITE_OTHER): Payer: Medicare Other | Admitting: Student

## 2018-09-22 VITALS — BP 155/78 | HR 87 | Wt 257.4 lb

## 2018-09-22 DIAGNOSIS — Z79899 Other long term (current) drug therapy: Secondary | ICD-10-CM

## 2018-09-22 DIAGNOSIS — M6281 Muscle weakness (generalized): Secondary | ICD-10-CM | POA: Diagnosis not present

## 2018-09-22 DIAGNOSIS — R0609 Other forms of dyspnea: Secondary | ICD-10-CM

## 2018-09-22 DIAGNOSIS — I10 Essential (primary) hypertension: Secondary | ICD-10-CM

## 2018-09-22 DIAGNOSIS — R600 Localized edema: Secondary | ICD-10-CM

## 2018-09-22 DIAGNOSIS — Z89511 Acquired absence of right leg below knee: Secondary | ICD-10-CM | POA: Diagnosis not present

## 2018-09-22 DIAGNOSIS — R2689 Other abnormalities of gait and mobility: Secondary | ICD-10-CM | POA: Diagnosis not present

## 2018-09-22 DIAGNOSIS — E785 Hyperlipidemia, unspecified: Secondary | ICD-10-CM

## 2018-09-22 DIAGNOSIS — I739 Peripheral vascular disease, unspecified: Secondary | ICD-10-CM

## 2018-09-22 DIAGNOSIS — Z89512 Acquired absence of left leg below knee: Secondary | ICD-10-CM | POA: Diagnosis not present

## 2018-09-22 MED ORDER — TORSEMIDE 20 MG PO TABS: 40.0000 mg | ORAL_TABLET | Freq: Every day | ORAL | 11 refills | Status: DC

## 2018-09-22 NOTE — Progress Notes (Signed)
Virtual Visit via Telephone Note   This visit type was conducted due to national recommendations for restrictions regarding the COVID-19 Pandemic (e.g. social distancing) in an effort to limit this patient's exposure and mitigate transmission in our community.  Due to his co-morbid illnesses, this patient is at least at moderate risk for complications without adequate follow up.  This format is felt to be most appropriate for this patient at this time.  The patient did not have access to video technology/had technical difficulties with video requiring transitioning to audio format only (telephone).  All issues noted in this document were discussed and addressed.  No physical exam could be performed with this format.  Please refer to the patient's chart for his  consent to telehealth for [facility].   Date:  09/22/2018   ID:  Cory Weber, DOB 1950-08-15, MRN [id number]  Patient Location: Home Provider Location: Office  PCP:  Iona Beard, MD  Cardiologist:  Kate Sable, MD  Electrophysiologist:  None   Evaluation Performed:  Follow-Up Visit  Chief Complaint:  Dyspnea on Exertion; Weight Gain  History of Present Illness:    Cory Weber is a 68 y.o. male with past medical history of Hepatitis C, HTN, HLD, IDDM, prior CVA, and PVD (s/p left fem-pop bypass which required left BKA in 08/2015 due to maggot infestation and right BKA in 08/2016) who presents to the office today for 36-month follow-up.   He was last examined by Dr. Bronson Ing in 03/2018 as a new patient referral for chest pain. He reported having symptoms for months which could sometimes subside with positional changes. Given his multiple cardiac risk factors a Lexiscan Myoview for ordered for ischemic evaluation. This showed a medium sized, mild intensity, fixed inferior/inferoseptal defect that was most consistent with soft tissue attenuation versus scar and no definite ischemic territories. Was read as a high  risk study due to calculated EF of 33%. An echocardiogram was obtained for further assessment and showed a preserved EF of 55-60% with no regional WMA and no significant valvular abnormalities.   In talking with the patient today, he reports overall doing well since his last office visit. He denies any recurrent episodes of chest pain and says this spontaneously resolved. He does report having dyspnea which has acutely worsened over the past few months. He is unsure if this is due to deconditioning or weight gain as his weight was previously 200 lbs in 2019 and was 260 lbs on most recent check.  He has been able to obtain lower extremity prostheses and is now walking up to 150 feet. He is typically in his wheelchair a majority of the day though. He has noted swelling along his upper thighs but denies any specific orthopnea or PND.  He does not consume a significant amount of fluid during the day but does add salt to most of his food. Also consumes potato chips regularly.   The patient does not have symptoms concerning for COVID-19 infection (fever, chills, cough, or new shortness of breath).    Past Medical History:  Diagnosis Date  . Anemia   . Asthma   . BPH (benign prostatic hyperplasia)   . Cellulitis and abscess of foot 07/2015  . Chronic diastolic heart failure ([place])    per Nursing facility  . Chronic kidney disease    CKD stage 3  . CVA (cerebral vascular accident) ([place]) 2015   denies residual on 08/15/2015  . GERD (gastroesophageal reflux disease)   . Hepatitis  C    states he was diagnosed in 2007 or 2007 while living in [place], [place]  . Hepatitis C   . Hyperlipidemia   . Hypertension   . Ischemia of foot 05/31/2016  . Osteomyelitis ([place])    per Nursing Facility  . Peripheral neuropathy   . Pneumonia X 1  . PVD (peripheral vascular disease) ([place])   . Type II diabetes mellitus ([place])   . Wet gangrene ([place]) 06/10/2016   Past Surgical History:  Procedure Laterality Date  .  ABDOMINAL AORTOGRAM N/A 06/03/2016   Procedure: Abdominal Aortogram;  Surgeon: Waynetta Sandy, MD;  Location: [facility];  Service: Cardiovascular;  Laterality: N/A;  . AMPUTATION Left 08/16/2015   Procedure: LEFT BELOW THE KNEE AMPUTATION ;  Surgeon: Serafina Mitchell, MD;  Location: [facility];  Service: Vascular;  Laterality: Left;  . AMPUTATION Right 08/22/2016   Procedure: AMPUTATION BELOW KNEE-RIGHT;  Surgeon: Waynetta Sandy, MD;  Location: [facility];  Service: Vascular;  Laterality: Right;  . BACK SURGERY    . BELOW KNEE LEG AMPUTATION Left 08/16/2015  . BIOPSY N/A 09/03/2012   Procedure: BIOPSY;  Surgeon: Daneil Dolin, MD;  [facility]: [facility];  Service: Endoscopy;  Laterality: N/A;  gastric and gastric mucosa  . BIOPSY N/A 12/03/2012   Procedure: BIOPSY;  Surgeon: Daneil Dolin, MD;  [facility]: [facility];  Service: Endoscopy;  Laterality: N/A;  . COLONOSCOPY WITH PROPOFOL N/A 09/03/2012   SHF:WYOVZCH polyp-removed as outlined above. Prominent internal hemorrhoids. Tubular adenoma  . ESOPHAGOGASTRODUODENOSCOPY (EGD) WITH PROPOFOL N/A 09/03/2012   YIF:OYDXAJ hernia. Gastric diverticulum. Gastric ulcers with associated erosions. Duodenal erosions. Status post gastric biopsy. H.PYLORI gastritis   . ESOPHAGOGASTRODUODENOSCOPY (EGD) WITH PROPOFOL N/A 12/03/2012   Dr. Gala Romney: gastric diverticulum, gastric erosions and scar. Previously noted gastric ulcer completed healed. Biopsy without H.pylori.   . ESOPHAGOGASTRODUODENOSCOPY (EGD) WITH PROPOFOL N/A 01/23/2016   Procedure: ESOPHAGOGASTRODUODENOSCOPY (EGD) WITH PROPOFOL;  Surgeon: Doran Stabler, MD;  [facility];  Service: Gastroenterology;  Laterality: N/A;  . I&D EXTREMITY Right 07/11/2016   Procedure: DELAY PRIMARY CLOSURE FOOT AMPUTATION;  Surgeon: Edrick Kins, DPM;  Location: [facility];  Service: Podiatry;  Laterality: Right;  . LIVER BIOPSY  2005   Done in [place], [place]. Chronic hepatitis with mild periportal  inflammation, lobular unicellular necrosis and portal fibrosis. Grade 2, stage 1-2.  . LOWER EXTREMITY ANGIOGRAPHY Right 06/03/2016   Procedure: Lower Extremity Angiography;  Surgeon: Waynetta Sandy, MD;  Location: [facility];  Service: Cardiovascular;  Laterality: Right;  . MAXIMUM ACCESS (MAS)POSTERIOR LUMBAR INTERBODY FUSION (PLIF) 1 LEVEL N/A 11/25/2013   Procedure: FOR MAXIMUM ACCESS (MAS) POSTERIOR LUMBAR INTERBODY FUSION (PLIF) 1 LEVEL;  Surgeon: Eustace Moore, MD;  Location: [facility];  Service: Neurosurgery;  Laterality: N/A;  FOR MAXIMUM ACCESS (MAS) POSTERIOR LUMBAR INTERBODY FUSION (PLIF) 1 LEVEL LUMBAR 3-4  . PERIPHERAL VASCULAR ATHERECTOMY Right 06/03/2016   Procedure: Peripheral Vascular Atherectomy;  Surgeon: Waynetta Sandy, MD;  Location: [facility];  Service: Cardiovascular;  Laterality: Right;  SFA WITH STENT  . PERIPHERAL VASCULAR CATHETERIZATION Left 08/01/2015   Procedure: Lower Extremity Angiography;  Surgeon: Serafina Mitchell, MD;  Location: [facility];  Service: Cardiovascular;  Laterality: Left;  . PERIPHERAL VASCULAR CATHETERIZATION N/A 08/01/2015   Procedure: Abdominal Aortogram;  Surgeon: Serafina Mitchell, MD;  Location: [facility];  Service: Cardiovascular;  Laterality: N/A;  . PERIPHERAL VASCULAR CATHETERIZATION N/A  08/08/2015   Procedure: Abdominal Aortogram w/Lower Extremity;  Surgeon: Serafina Mitchell, MD;  Location: [place] [facility];  Service: Cardiovascular;  Laterality: N/A;  . PERIPHERAL VASCULAR CATHETERIZATION Left 08/08/2015   Procedure: Peripheral Vascular Balloon Angioplasty;  Surgeon: Serafina Mitchell, MD;  Location: [facility];  Service: Cardiovascular;  Laterality: Left;  left popiteal artery, left peronealtrunk, left post tibial  . POLYPECTOMY N/A 09/03/2012   Procedure: POLYPECTOMY;  Surgeon: Daneil Dolin, MD;  [facility];  Service: Endoscopy;  Laterality: N/A;  cecal polyp  . TONSILLECTOMY     . TRANSMETATARSAL AMPUTATION Right 06/13/2016   Procedure: RIGHT TRANSMETATARSAL AMPUTATION;  Surgeon: Waynetta Sandy, MD;  Location: [facility];  Service: Vascular;  Laterality: Right;  . TRANSMETATARSAL AMPUTATION Right 07/10/2016   Procedure: REVISION RIGHT TRANSMETATARSAL AMPUTATION;  Surgeon: Waynetta Sandy, MD;  Location: [facility];  Service: Vascular;  Laterality: Right;     Current Meds  Medication Sig  . acetaminophen (TYLENOL) 325 MG tablet Take 650 mg by mouth every 4 (four) hours as needed for fever. For temp greater than 100.4  . allopurinol (ZYLOPRIM) 100 MG tablet Take 200 mg by mouth every evening.  . Amino Acids-Protein Hydrolys (FEEDING SUPPLEMENT, PRO-STAT SUGAR FREE 64,) LIQD Take 30 mLs by mouth 2 (two) times daily.  Marland Kitchen aspirin EC 81 MG tablet Take 81 mg by mouth daily.  . baclofen (LIORESAL) 10 MG tablet   . Baclofen 5 MG TABS   . carvedilol (COREG) 3.125 MG tablet Take 1 tablet (3.125 mg total) by mouth 2 (two) times daily with a meal.  . Cholecalciferol (VITAMIN D3) 2000 units TABS Take 1 tablet by mouth daily.  . clopidogrel (PLAVIX) 75 MG tablet Take 1 tablet (75 mg total) by mouth daily with breakfast.  . ferrous sulfate 325 (65 FE) MG tablet Take 325 mg by mouth 2 (two) times daily.   Marland Kitchen gabapentin (NEURONTIN) 600 MG tablet Take 1 tablet (600 mg total) by mouth 3 (three) times daily.  . insulin aspart (NOVOLOG) 100 UNIT/ML injection Inject 5 Units into the skin 3 (three) times daily after meals. (Patient taking differently: Inject 2-8 Units into the skin as directed. Inject 5 units 3 times daily after meals.  Inject 3 times daily before meals as per sliding scale: 150-200= 2 units, 201-250= 4 units, 251-300= 6 units, 301-350= 8 units, if >251 notify MD)  . insulin glargine (LANTUS) 100 UNIT/ML injection Inject 0.25 mLs (25 Units total) into the skin at bedtime. (Patient taking differently: Inject 20 Units into the skin at bedtime. )  . lisinopril  (PRINIVIL,ZESTRIL) 5 MG tablet Take 5 mg by mouth daily.   . magnesium hydroxide (MILK OF MAGNESIA) 400 MG/5ML suspension Take 30 mLs by mouth daily as needed for mild constipation.  . metFORMIN (GLUCOPHAGE) 500 MG tablet Take 500 mg by mouth 2 (two) times daily with a meal.   . polyethylene glycol (MIRALAX / GLYCOLAX) packet Take 17 g by mouth 2 (two) times daily. (Patient taking differently: Take 17 g by mouth every 12 (twelve) hours as needed for mild constipation. )  . Probiotic CAPS Take 1 capsule by mouth 2 (two) times daily.  Marland Kitchen senna-docusate (SENOKOT-S) 8.6-50 MG tablet Take 1 tablet by mouth 2 (two) times daily. (Patient taking differently: Take 1 tablet by mouth 2 (two) times daily. Hold for loose stools)  . simvastatin (ZOCOR) 20 MG tablet Take 20 mg by mouth daily.   . tamsulosin (FLOMAX) 0.4 MG  CAPS capsule Take 1 capsule (0.4 mg total) by mouth daily.  . vitamin C (ASCORBIC ACID) 500 MG tablet Take 500 mg by mouth 2 (two) times daily.  . Vitamin D, Ergocalciferol, (DRISDOL) 50000 units CAPS capsule Take 50,000 Units by mouth every Friday. Takes every Friday   . [DISCONTINUED] torsemide (DEMADEX) 20 MG tablet Take 20 mg by mouth daily.      Allergies:   No known allergies   Social History   Tobacco Use  . Smoking status: Former Smoker    Packs/day: 1.00    Years: 47.00    Pack years: 47.00    Types: Cigarettes    Quit date: 07/13/2015    Years since quitting: 3.1  . Smokeless tobacco: Never Used  Substance Use Topics  . Alcohol use: No    Alcohol/week: 0.0 standard drinks    Comment:  none 08/15/2015 "stopped drinking 3-4 years ago"  . Drug use: No    Comment: 08/15/2015 "last used cocaine 3-4 months ago"     Family Hx: The patient's family history includes Breast cancer in his mother and sister; Diabetes in his father and sister; Heart disease in his father; Heart failure in his mother; Hyperlipidemia in his father; Hypertension in his father and sister. There is no history  of Colon cancer or Liver disease.  ROS:   Please see the history of present illness.     All other systems reviewed and are negative.   Prior CV studies:   The following studies were reviewed today:  NST: 03/2018  No diagnostic ST segment changes to indicate ischemia.  Medium sized, mild intensity, fixed inferior/inferoseptal defect that is most consistent with soft tissue attenuation versus scar. No definite ischemic territories.  This is a high risk study based on reduced LVEF. Consider echocardiogram for further assessment.  Nuclear stress EF: 33%.  Echocardiogram: 03/2018 IMPRESSIONS   1. The left ventricle has normal systolic function, with an ejection fraction of 55-60%. The cavity size was normal. There is mildly increased left ventricular wall thickness. Left ventricular diastolic Doppler parameters are indeterminate Indeterminent  filling pressures.  2. The mitral valve is normal in structure. Moderate thickening of the mitral valve leaflet. No evidence of mitral valve stenosis.  3. The aortic valve has an indeterminant number of cusps. Cannot exclude bicuspid aortic valve. Moderately thickened valve  4. The aortic root is normal in size and structure.  5. The right ventricle has normal systolic function. The cavity was normal. There is no increase in right ventricular wall thickness.  6. The tricuspid valve is normal in structure.  7. Pulmonary hypertension is indeterminant, inadequate TR jet.  8. The interatrial septum was not well visualized.  Labs/Other Tests and Data Reviewed:    EKG:  No ECG reviewed.  Recent Labs: No results found for requested labs within last 8760 hours.   Recent Lipid Panel Lab Results  Component Value Date/Time   CHOL 142 11/29/2015   TRIG 75 11/29/2015   HDL 43 11/29/2015   CHOLHDL 2.9 11/24/2013 03:35 AM   LDLCALC 84 11/29/2015    Wt Readings from Last 3 Encounters:  09/22/18 257 lb 6.4 oz (116.8 kg)  02/05/18 205 lb (93 kg)   01/14/17 205 lb (93 kg)     Objective:    Vital Signs:  BP (!) 155/78   Pulse 87   Wt 257 lb 6.4 oz (116.8 kg)   BMI 34.91 kg/m    General: Pleasant male sounding in NAD Psych:  Normal affect. Neuro: Alert and oriented X 3.  Lungs:  Resp regular and unlabored while talking on the phone.   ASSESSMENT & PLAN:    1. Dyspnea on Exertion/ Lower Extremity Edema - he reports having baseline dyspnea which can occur at rest or with activity.  He also notes edema along his upper legs and abdominal distention.  Weight has increased by over 50 pounds within the past 6+ months but his activity has been minimal in the setting of bilateral amputations. He is walking with prosthetic devices intermittently.  - Recent NST showed no significant ischemia and EF was preserved at 55 to 60% by echocardiogram with no regional wall motion abnormalities as outlined above. - he is currently taking Torsemide 20mg  daily. Will increase to 40mg  daily. Recheck BNP and BMET in 2 weeks. I have asked them to obtain at least once weekly weights as he is currently only weighed once a month. Recommended limiting sodium intake to less than 2000 mg and fluid intake to less than 2L per day.   2. HTN - BP elevated at 155/78 on most recent check. Currently on Coreg 3.125mg  BID and Lisinopril 5mg  daily. Would follow BP with dose adjustment of diuretic therapy as planned above. If BP remains above goal, could further titrate either Coreg or Lisinopril.   3. HLD - followed by PCP. Goal LDL is less than 70 in the setting of known CAD. Remains on Simvastatin 20mg  daily.   4. PVD - s/p left fem-pop bypass which required left BKA in 08/2015 due to maggot infestation and right BKA in 08/2016. Followed by Vascular Surgery. Remains on ASA, Plavix, and statin therapy.   COVID-19 Education: The signs and symptoms of COVID-19 were discussed with the patient and how to seek care for testing (follow up with PCP or arrange E-visit).  The  importance of social distancing was discussed today.  Time:   Today, I have spent 19 minutes with the patient with telehealth technology discussing the above problems.     Medication Adjustments/Labs and Tests Ordered: Current medicines are reviewed at length with the patient today.  Concerns regarding medicines are outlined above.   Tests Ordered: Orders Placed This Encounter  Procedures  . Basic Metabolic Panel (BMET)  . B Nat Peptide    Medication Changes: Meds ordered this encounter  Medications  . torsemide (DEMADEX) 20 MG tablet    Sig: Take 2 tablets (40 mg total) by mouth daily.    Dispense:  60 tablet    Refill:  11    Follow Up:  Virtual Visit in 3 week(s)  Signed, Erma Heritage, PA-C  09/22/2018 4:44 PM    [facility]

## 2018-09-22 NOTE — Patient Instructions (Signed)
Medication Instructions:  Your physician has recommended you make the following change in your medication:  Increase Torsemide to 40 mg Daily   If you need a refill on your cardiac medications before your next appointment, please call your pharmacy.   Lab work: Your physician recommends that you return for lab work in: BMET/BNP in 2 Weeks   If you have labs (blood work) drawn today and your tests are completely normal, you will receive your results only by: Marland Kitchen MyChart Message (if you have MyChart) OR . A paper copy in the mail If you have any lab test that is abnormal or we need to change your treatment, we will call you to review the results.  Testing/Procedures: NONE   Follow-Up: At [facility], you and your health needs are our priority.  As part of our continuing mission to provide you with exceptional heart care, we have created designated Provider Care Teams.  These Care Teams include your primary Cardiologist (physician) and Advanced Practice Providers (APPs -  Physician Assistants and Nurse Practitioners) who all work together to provide you with the care you need, when you need it. You will need a follow up appointment in 3-4 weeks.  Please call our office 2 months in advance to schedule this appointment.  You may see Kate Sable, MD or one of the following Advanced Practice Providers on your designated Care Team:   Bernerd Pho, PA-C [facility]) . Ermalinda Barrios, PA-C ([facility])  Any Other Special Instructions Will Be Listed Below (If Applicable). Thank you for choosing [facility]!

## 2018-09-23 DIAGNOSIS — M6281 Muscle weakness (generalized): Secondary | ICD-10-CM | POA: Diagnosis not present

## 2018-09-23 DIAGNOSIS — Z89512 Acquired absence of left leg below knee: Secondary | ICD-10-CM | POA: Diagnosis not present

## 2018-09-23 DIAGNOSIS — Z89511 Acquired absence of right leg below knee: Secondary | ICD-10-CM | POA: Diagnosis not present

## 2018-09-23 DIAGNOSIS — R2689 Other abnormalities of gait and mobility: Secondary | ICD-10-CM | POA: Diagnosis not present

## 2018-09-25 DIAGNOSIS — Z20828 Contact with and (suspected) exposure to other viral communicable diseases: Secondary | ICD-10-CM | POA: Diagnosis not present

## 2018-10-07 DIAGNOSIS — N184 Chronic kidney disease, stage 4 (severe): Secondary | ICD-10-CM | POA: Diagnosis not present

## 2018-10-07 DIAGNOSIS — D649 Anemia, unspecified: Secondary | ICD-10-CM | POA: Diagnosis not present

## 2018-10-15 ENCOUNTER — Encounter: Payer: Medicare Other | Admitting: Student

## 2018-10-15 ENCOUNTER — Encounter: Payer: Self-pay | Admitting: Student

## 2018-10-15 DIAGNOSIS — I130 Hypertensive heart and chronic kidney disease with heart failure and stage 1 through stage 4 chronic kidney disease, or unspecified chronic kidney disease: Secondary | ICD-10-CM | POA: Diagnosis not present

## 2018-10-15 DIAGNOSIS — I5032 Chronic diastolic (congestive) heart failure: Secondary | ICD-10-CM | POA: Diagnosis not present

## 2018-10-15 DIAGNOSIS — Z794 Long term (current) use of insulin: Secondary | ICD-10-CM | POA: Diagnosis not present

## 2018-10-15 DIAGNOSIS — E1122 Type 2 diabetes mellitus with diabetic chronic kidney disease: Secondary | ICD-10-CM | POA: Diagnosis not present

## 2018-10-15 NOTE — Progress Notes (Signed)
   Unable to reach SNF for Virtual Visit.

## 2018-10-23 DIAGNOSIS — D519 Vitamin B12 deficiency anemia, unspecified: Secondary | ICD-10-CM | POA: Diagnosis not present

## 2018-10-23 DIAGNOSIS — E119 Type 2 diabetes mellitus without complications: Secondary | ICD-10-CM | POA: Diagnosis not present

## 2018-10-23 DIAGNOSIS — I10 Essential (primary) hypertension: Secondary | ICD-10-CM | POA: Diagnosis not present

## 2018-10-23 DIAGNOSIS — E559 Vitamin D deficiency, unspecified: Secondary | ICD-10-CM | POA: Diagnosis not present

## 2018-10-23 DIAGNOSIS — D649 Anemia, unspecified: Secondary | ICD-10-CM | POA: Diagnosis not present

## 2018-10-23 DIAGNOSIS — E785 Hyperlipidemia, unspecified: Secondary | ICD-10-CM | POA: Diagnosis not present

## 2018-10-23 DIAGNOSIS — N184 Chronic kidney disease, stage 4 (severe): Secondary | ICD-10-CM | POA: Diagnosis not present

## 2018-10-23 DIAGNOSIS — I739 Peripheral vascular disease, unspecified: Secondary | ICD-10-CM | POA: Diagnosis not present

## 2018-10-26 DIAGNOSIS — Z794 Long term (current) use of insulin: Secondary | ICD-10-CM | POA: Diagnosis not present

## 2018-10-26 DIAGNOSIS — D509 Iron deficiency anemia, unspecified: Secondary | ICD-10-CM | POA: Diagnosis not present

## 2018-10-26 DIAGNOSIS — N182 Chronic kidney disease, stage 2 (mild): Secondary | ICD-10-CM | POA: Diagnosis not present

## 2018-10-26 DIAGNOSIS — E1122 Type 2 diabetes mellitus with diabetic chronic kidney disease: Secondary | ICD-10-CM | POA: Diagnosis not present

## 2018-10-27 DIAGNOSIS — Z20828 Contact with and (suspected) exposure to other viral communicable diseases: Secondary | ICD-10-CM | POA: Diagnosis not present

## 2018-11-02 DIAGNOSIS — Z20828 Contact with and (suspected) exposure to other viral communicable diseases: Secondary | ICD-10-CM | POA: Diagnosis not present

## 2018-11-09 DIAGNOSIS — Z20828 Contact with and (suspected) exposure to other viral communicable diseases: Secondary | ICD-10-CM | POA: Diagnosis not present

## 2018-11-11 DIAGNOSIS — E559 Vitamin D deficiency, unspecified: Secondary | ICD-10-CM | POA: Diagnosis not present

## 2018-11-11 DIAGNOSIS — I739 Peripheral vascular disease, unspecified: Secondary | ICD-10-CM | POA: Diagnosis not present

## 2018-11-11 DIAGNOSIS — K5900 Constipation, unspecified: Secondary | ICD-10-CM | POA: Diagnosis not present

## 2018-11-17 DIAGNOSIS — Z20828 Contact with and (suspected) exposure to other viral communicable diseases: Secondary | ICD-10-CM | POA: Diagnosis not present

## 2018-11-19 DIAGNOSIS — N182 Chronic kidney disease, stage 2 (mild): Secondary | ICD-10-CM | POA: Diagnosis not present

## 2018-11-19 DIAGNOSIS — I130 Hypertensive heart and chronic kidney disease with heart failure and stage 1 through stage 4 chronic kidney disease, or unspecified chronic kidney disease: Secondary | ICD-10-CM | POA: Diagnosis not present

## 2018-11-19 DIAGNOSIS — I5032 Chronic diastolic (congestive) heart failure: Secondary | ICD-10-CM | POA: Diagnosis not present

## 2018-11-20 DIAGNOSIS — Z20828 Contact with and (suspected) exposure to other viral communicable diseases: Secondary | ICD-10-CM | POA: Diagnosis not present

## 2018-11-25 DIAGNOSIS — Z20828 Contact with and (suspected) exposure to other viral communicable diseases: Secondary | ICD-10-CM | POA: Diagnosis not present

## 2018-11-26 DIAGNOSIS — M542 Cervicalgia: Secondary | ICD-10-CM | POA: Diagnosis not present

## 2018-11-29 DIAGNOSIS — Z20828 Contact with and (suspected) exposure to other viral communicable diseases: Secondary | ICD-10-CM | POA: Diagnosis not present

## 2018-11-30 DIAGNOSIS — M542 Cervicalgia: Secondary | ICD-10-CM | POA: Diagnosis not present

## 2018-12-03 DIAGNOSIS — R221 Localized swelling, mass and lump, neck: Secondary | ICD-10-CM | POA: Diagnosis not present

## 2018-12-03 DIAGNOSIS — I6523 Occlusion and stenosis of bilateral carotid arteries: Secondary | ICD-10-CM | POA: Diagnosis not present

## 2018-12-03 DIAGNOSIS — I130 Hypertensive heart and chronic kidney disease with heart failure and stage 1 through stage 4 chronic kidney disease, or unspecified chronic kidney disease: Secondary | ICD-10-CM | POA: Diagnosis not present

## 2018-12-07 DIAGNOSIS — Z20828 Contact with and (suspected) exposure to other viral communicable diseases: Secondary | ICD-10-CM | POA: Diagnosis not present

## 2018-12-12 DIAGNOSIS — Z20828 Contact with and (suspected) exposure to other viral communicable diseases: Secondary | ICD-10-CM | POA: Diagnosis not present

## 2018-12-14 DIAGNOSIS — E119 Type 2 diabetes mellitus without complications: Secondary | ICD-10-CM | POA: Diagnosis not present

## 2018-12-17 DIAGNOSIS — I130 Hypertensive heart and chronic kidney disease with heart failure and stage 1 through stage 4 chronic kidney disease, or unspecified chronic kidney disease: Secondary | ICD-10-CM | POA: Diagnosis not present

## 2018-12-17 DIAGNOSIS — R0989 Other specified symptoms and signs involving the circulatory and respiratory systems: Secondary | ICD-10-CM | POA: Diagnosis not present

## 2018-12-23 DIAGNOSIS — G4730 Sleep apnea, unspecified: Secondary | ICD-10-CM | POA: Diagnosis not present

## 2018-12-23 DIAGNOSIS — I5032 Chronic diastolic (congestive) heart failure: Secondary | ICD-10-CM | POA: Diagnosis not present

## 2018-12-23 DIAGNOSIS — D649 Anemia, unspecified: Secondary | ICD-10-CM | POA: Diagnosis not present

## 2018-12-23 DIAGNOSIS — E1143 Type 2 diabetes mellitus with diabetic autonomic (poly)neuropathy: Secondary | ICD-10-CM | POA: Diagnosis not present

## 2018-12-23 DIAGNOSIS — N182 Chronic kidney disease, stage 2 (mild): Secondary | ICD-10-CM | POA: Diagnosis not present

## 2018-12-23 DIAGNOSIS — I130 Hypertensive heart and chronic kidney disease with heart failure and stage 1 through stage 4 chronic kidney disease, or unspecified chronic kidney disease: Secondary | ICD-10-CM | POA: Diagnosis not present

## 2019-01-04 DIAGNOSIS — I130 Hypertensive heart and chronic kidney disease with heart failure and stage 1 through stage 4 chronic kidney disease, or unspecified chronic kidney disease: Secondary | ICD-10-CM | POA: Diagnosis not present

## 2019-01-04 DIAGNOSIS — E785 Hyperlipidemia, unspecified: Secondary | ICD-10-CM | POA: Diagnosis not present

## 2019-01-04 DIAGNOSIS — I5032 Chronic diastolic (congestive) heart failure: Secondary | ICD-10-CM | POA: Diagnosis not present

## 2019-01-04 DIAGNOSIS — D509 Iron deficiency anemia, unspecified: Secondary | ICD-10-CM | POA: Diagnosis not present

## 2019-01-11 DIAGNOSIS — Z79899 Other long term (current) drug therapy: Secondary | ICD-10-CM | POA: Diagnosis not present

## 2019-01-11 DIAGNOSIS — Z86010 Personal history of colonic polyps: Secondary | ICD-10-CM | POA: Diagnosis not present

## 2019-01-11 DIAGNOSIS — D509 Iron deficiency anemia, unspecified: Secondary | ICD-10-CM | POA: Diagnosis not present

## 2019-01-11 DIAGNOSIS — A0472 Enterocolitis due to Clostridium difficile, not specified as recurrent: Secondary | ICD-10-CM | POA: Diagnosis not present

## 2019-01-11 DIAGNOSIS — R195 Other fecal abnormalities: Secondary | ICD-10-CM | POA: Diagnosis not present

## 2019-01-18 DIAGNOSIS — I130 Hypertensive heart and chronic kidney disease with heart failure and stage 1 through stage 4 chronic kidney disease, or unspecified chronic kidney disease: Secondary | ICD-10-CM | POA: Diagnosis not present

## 2019-01-18 DIAGNOSIS — E1122 Type 2 diabetes mellitus with diabetic chronic kidney disease: Secondary | ICD-10-CM | POA: Diagnosis not present

## 2019-01-18 DIAGNOSIS — A0472 Enterocolitis due to Clostridium difficile, not specified as recurrent: Secondary | ICD-10-CM | POA: Diagnosis not present

## 2019-02-13 DIAGNOSIS — R2689 Other abnormalities of gait and mobility: Secondary | ICD-10-CM | POA: Diagnosis not present

## 2019-02-13 DIAGNOSIS — Z89511 Acquired absence of right leg below knee: Secondary | ICD-10-CM | POA: Diagnosis not present

## 2019-02-15 DIAGNOSIS — Z89511 Acquired absence of right leg below knee: Secondary | ICD-10-CM | POA: Diagnosis not present

## 2019-02-15 DIAGNOSIS — Z20828 Contact with and (suspected) exposure to other viral communicable diseases: Secondary | ICD-10-CM | POA: Diagnosis not present

## 2019-02-15 DIAGNOSIS — R2689 Other abnormalities of gait and mobility: Secondary | ICD-10-CM | POA: Diagnosis not present

## 2019-02-16 DIAGNOSIS — R2689 Other abnormalities of gait and mobility: Secondary | ICD-10-CM | POA: Diagnosis not present

## 2019-02-16 DIAGNOSIS — Z23 Encounter for immunization: Secondary | ICD-10-CM | POA: Diagnosis not present

## 2019-02-16 DIAGNOSIS — Z89511 Acquired absence of right leg below knee: Secondary | ICD-10-CM | POA: Diagnosis not present

## 2019-02-17 DIAGNOSIS — Z89511 Acquired absence of right leg below knee: Secondary | ICD-10-CM | POA: Diagnosis not present

## 2019-02-17 DIAGNOSIS — M25519 Pain in unspecified shoulder: Secondary | ICD-10-CM | POA: Diagnosis present

## 2019-02-17 DIAGNOSIS — Z66 Do not resuscitate: Secondary | ICD-10-CM | POA: Diagnosis present

## 2019-02-17 DIAGNOSIS — I120 Hypertensive chronic kidney disease with stage 5 chronic kidney disease or end stage renal disease: Secondary | ICD-10-CM | POA: Diagnosis present

## 2019-02-17 DIAGNOSIS — R069 Unspecified abnormalities of breathing: Secondary | ICD-10-CM | POA: Diagnosis not present

## 2019-02-17 DIAGNOSIS — N400 Enlarged prostate without lower urinary tract symptoms: Secondary | ICD-10-CM | POA: Diagnosis present

## 2019-02-17 DIAGNOSIS — R2689 Other abnormalities of gait and mobility: Secondary | ICD-10-CM | POA: Diagnosis present

## 2019-02-17 DIAGNOSIS — E1122 Type 2 diabetes mellitus with diabetic chronic kidney disease: Secondary | ICD-10-CM | POA: Diagnosis present

## 2019-02-17 DIAGNOSIS — J1282 Pneumonia due to coronavirus disease 2019: Secondary | ICD-10-CM | POA: Diagnosis not present

## 2019-02-17 DIAGNOSIS — J9601 Acute respiratory failure with hypoxia: Secondary | ICD-10-CM | POA: Diagnosis present

## 2019-02-17 DIAGNOSIS — N183 Chronic kidney disease, stage 3 unspecified: Secondary | ICD-10-CM | POA: Diagnosis present

## 2019-02-17 DIAGNOSIS — N390 Urinary tract infection, site not specified: Secondary | ICD-10-CM | POA: Diagnosis present

## 2019-02-17 DIAGNOSIS — I739 Peripheral vascular disease, unspecified: Secondary | ICD-10-CM | POA: Diagnosis present

## 2019-02-17 DIAGNOSIS — I7092 Chronic total occlusion of artery of the extremities: Secondary | ICD-10-CM | POA: Diagnosis present

## 2019-02-17 DIAGNOSIS — I10 Essential (primary) hypertension: Secondary | ICD-10-CM | POA: Diagnosis present

## 2019-02-17 DIAGNOSIS — I130 Hypertensive heart and chronic kidney disease with heart failure and stage 1 through stage 4 chronic kidney disease, or unspecified chronic kidney disease: Secondary | ICD-10-CM | POA: Diagnosis present

## 2019-02-17 DIAGNOSIS — G4700 Insomnia, unspecified: Secondary | ICD-10-CM | POA: Diagnosis present

## 2019-02-17 DIAGNOSIS — B9689 Other specified bacterial agents as the cause of diseases classified elsewhere: Secondary | ICD-10-CM | POA: Diagnosis present

## 2019-02-17 DIAGNOSIS — E860 Dehydration: Secondary | ICD-10-CM | POA: Diagnosis present

## 2019-02-17 DIAGNOSIS — M6281 Muscle weakness (generalized): Secondary | ICD-10-CM | POA: Diagnosis present

## 2019-02-17 DIAGNOSIS — Z981 Arthrodesis status: Secondary | ICD-10-CM | POA: Diagnosis not present

## 2019-02-17 DIAGNOSIS — K219 Gastro-esophageal reflux disease without esophagitis: Secondary | ICD-10-CM | POA: Diagnosis present

## 2019-02-17 DIAGNOSIS — Z4781 Encounter for orthopedic aftercare following surgical amputation: Secondary | ICD-10-CM | POA: Diagnosis not present

## 2019-02-17 DIAGNOSIS — E1165 Type 2 diabetes mellitus with hyperglycemia: Secondary | ICD-10-CM | POA: Diagnosis present

## 2019-02-17 DIAGNOSIS — U071 COVID-19: Secondary | ICD-10-CM | POA: Diagnosis present

## 2019-02-17 DIAGNOSIS — R32 Unspecified urinary incontinence: Secondary | ICD-10-CM | POA: Diagnosis present

## 2019-02-17 DIAGNOSIS — D638 Anemia in other chronic diseases classified elsewhere: Secondary | ICD-10-CM | POA: Diagnosis not present

## 2019-02-17 DIAGNOSIS — Z89512 Acquired absence of left leg below knee: Secondary | ICD-10-CM | POA: Diagnosis not present

## 2019-02-17 DIAGNOSIS — M869 Osteomyelitis, unspecified: Secondary | ICD-10-CM | POA: Diagnosis present

## 2019-02-17 DIAGNOSIS — N1831 Chronic kidney disease, stage 3a: Secondary | ICD-10-CM | POA: Diagnosis not present

## 2019-02-17 DIAGNOSIS — G629 Polyneuropathy, unspecified: Secondary | ICD-10-CM | POA: Diagnosis present

## 2019-02-17 DIAGNOSIS — R0602 Shortness of breath: Secondary | ICD-10-CM | POA: Diagnosis not present

## 2019-02-17 DIAGNOSIS — J45909 Unspecified asthma, uncomplicated: Secondary | ICD-10-CM | POA: Diagnosis present

## 2019-02-17 DIAGNOSIS — T8189XD Other complications of procedures, not elsewhere classified, subsequent encounter: Secondary | ICD-10-CM | POA: Diagnosis not present

## 2019-02-17 DIAGNOSIS — M1A3710 Chronic gout due to renal impairment, right ankle and foot, without tophus (tophi): Secondary | ICD-10-CM | POA: Diagnosis not present

## 2019-02-17 DIAGNOSIS — M1A9XX0 Chronic gout, unspecified, without tophus (tophi): Secondary | ICD-10-CM | POA: Diagnosis present

## 2019-02-17 DIAGNOSIS — B1710 Acute hepatitis C without hepatic coma: Secondary | ICD-10-CM | POA: Diagnosis present

## 2019-02-17 DIAGNOSIS — D631 Anemia in chronic kidney disease: Secondary | ICD-10-CM | POA: Diagnosis present

## 2019-02-17 DIAGNOSIS — B182 Chronic viral hepatitis C: Secondary | ICD-10-CM | POA: Diagnosis present

## 2019-02-17 DIAGNOSIS — D649 Anemia, unspecified: Secondary | ICD-10-CM | POA: Diagnosis present

## 2019-02-17 DIAGNOSIS — M25511 Pain in right shoulder: Secondary | ICD-10-CM | POA: Diagnosis present

## 2019-02-17 DIAGNOSIS — Z1619 Resistance to other specified beta lactam antibiotics: Secondary | ICD-10-CM | POA: Diagnosis present

## 2019-02-17 DIAGNOSIS — E1151 Type 2 diabetes mellitus with diabetic peripheral angiopathy without gangrene: Secondary | ICD-10-CM | POA: Diagnosis present

## 2019-02-17 DIAGNOSIS — E785 Hyperlipidemia, unspecified: Secondary | ICD-10-CM | POA: Diagnosis present

## 2019-02-17 DIAGNOSIS — E1169 Type 2 diabetes mellitus with other specified complication: Secondary | ICD-10-CM | POA: Diagnosis present

## 2019-02-17 DIAGNOSIS — I5032 Chronic diastolic (congestive) heart failure: Secondary | ICD-10-CM | POA: Diagnosis present

## 2019-02-17 DIAGNOSIS — E1143 Type 2 diabetes mellitus with diabetic autonomic (poly)neuropathy: Secondary | ICD-10-CM | POA: Diagnosis present

## 2019-02-20 ENCOUNTER — Encounter (HOSPITAL_COMMUNITY): Payer: Self-pay | Admitting: Emergency Medicine

## 2019-02-20 ENCOUNTER — Inpatient Hospital Stay (HOSPITAL_COMMUNITY)
Admission: EM | Admit: 2019-02-20 | Discharge: 2019-02-25 | DRG: 177 | Disposition: A | Discharge status: Skilled Nursing Facility | Payer: Medicare Other | Source: Skilled Nursing Facility | Attending: Family Medicine | Admitting: Family Medicine

## 2019-02-20 ENCOUNTER — Other Ambulatory Visit: Payer: Self-pay

## 2019-02-20 ENCOUNTER — Emergency Department (HOSPITAL_COMMUNITY): Payer: Medicare Other

## 2019-02-20 DIAGNOSIS — I7092 Chronic total occlusion of artery of the extremities: Secondary | ICD-10-CM | POA: Diagnosis present

## 2019-02-20 DIAGNOSIS — J1282 Pneumonia due to coronavirus disease 2019: Secondary | ICD-10-CM | POA: Diagnosis present

## 2019-02-20 DIAGNOSIS — E1169 Type 2 diabetes mellitus with other specified complication: Secondary | ICD-10-CM | POA: Diagnosis present

## 2019-02-20 DIAGNOSIS — Z7982 Long term (current) use of aspirin: Secondary | ICD-10-CM

## 2019-02-20 DIAGNOSIS — Z8349 Family history of other endocrine, nutritional and metabolic diseases: Secondary | ICD-10-CM

## 2019-02-20 DIAGNOSIS — N400 Enlarged prostate without lower urinary tract symptoms: Secondary | ICD-10-CM | POA: Diagnosis present

## 2019-02-20 DIAGNOSIS — E1122 Type 2 diabetes mellitus with diabetic chronic kidney disease: Secondary | ICD-10-CM | POA: Diagnosis present

## 2019-02-20 DIAGNOSIS — N390 Urinary tract infection, site not specified: Secondary | ICD-10-CM | POA: Diagnosis present

## 2019-02-20 DIAGNOSIS — Z1619 Resistance to other specified beta lactam antibiotics: Secondary | ICD-10-CM | POA: Diagnosis present

## 2019-02-20 DIAGNOSIS — D649 Anemia, unspecified: Secondary | ICD-10-CM | POA: Diagnosis present

## 2019-02-20 DIAGNOSIS — T8189XD Other complications of procedures, not elsewhere classified, subsequent encounter: Secondary | ICD-10-CM | POA: Diagnosis not present

## 2019-02-20 DIAGNOSIS — G4700 Insomnia, unspecified: Secondary | ICD-10-CM | POA: Diagnosis present

## 2019-02-20 DIAGNOSIS — M25511 Pain in right shoulder: Secondary | ICD-10-CM | POA: Diagnosis present

## 2019-02-20 DIAGNOSIS — N183 Chronic kidney disease, stage 3 unspecified: Secondary | ICD-10-CM | POA: Diagnosis present

## 2019-02-20 DIAGNOSIS — I739 Peripheral vascular disease, unspecified: Secondary | ICD-10-CM | POA: Diagnosis present

## 2019-02-20 DIAGNOSIS — I130 Hypertensive heart and chronic kidney disease with heart failure and stage 1 through stage 4 chronic kidney disease, or unspecified chronic kidney disease: Secondary | ICD-10-CM | POA: Diagnosis present

## 2019-02-20 DIAGNOSIS — F1010 Alcohol abuse, uncomplicated: Secondary | ICD-10-CM

## 2019-02-20 DIAGNOSIS — R069 Unspecified abnormalities of breathing: Secondary | ICD-10-CM | POA: Diagnosis not present

## 2019-02-20 DIAGNOSIS — E1143 Type 2 diabetes mellitus with diabetic autonomic (poly)neuropathy: Secondary | ICD-10-CM | POA: Diagnosis present

## 2019-02-20 DIAGNOSIS — D638 Anemia in other chronic diseases classified elsewhere: Secondary | ICD-10-CM | POA: Diagnosis present

## 2019-02-20 DIAGNOSIS — Z89511 Acquired absence of right leg below knee: Secondary | ICD-10-CM

## 2019-02-20 DIAGNOSIS — M1A9XX0 Chronic gout, unspecified, without tophus (tophi): Secondary | ICD-10-CM | POA: Diagnosis present

## 2019-02-20 DIAGNOSIS — M1A3710 Chronic gout due to renal impairment, right ankle and foot, without tophus (tophi): Secondary | ICD-10-CM | POA: Diagnosis present

## 2019-02-20 DIAGNOSIS — Z720 Tobacco use: Secondary | ICD-10-CM | POA: Diagnosis present

## 2019-02-20 DIAGNOSIS — E860 Dehydration: Secondary | ICD-10-CM | POA: Diagnosis present

## 2019-02-20 DIAGNOSIS — I5032 Chronic diastolic (congestive) heart failure: Secondary | ICD-10-CM | POA: Diagnosis present

## 2019-02-20 DIAGNOSIS — Z7902 Long term (current) use of antithrombotics/antiplatelets: Secondary | ICD-10-CM

## 2019-02-20 DIAGNOSIS — Z66 Do not resuscitate: Secondary | ICD-10-CM | POA: Diagnosis present

## 2019-02-20 DIAGNOSIS — Z794 Long term (current) use of insulin: Secondary | ICD-10-CM

## 2019-02-20 DIAGNOSIS — Z8673 Personal history of transient ischemic attack (TIA), and cerebral infarction without residual deficits: Secondary | ICD-10-CM

## 2019-02-20 DIAGNOSIS — Z87891 Personal history of nicotine dependence: Secondary | ICD-10-CM

## 2019-02-20 DIAGNOSIS — R5381 Other malaise: Secondary | ICD-10-CM | POA: Diagnosis present

## 2019-02-20 DIAGNOSIS — Z89512 Acquired absence of left leg below knee: Secondary | ICD-10-CM | POA: Diagnosis not present

## 2019-02-20 DIAGNOSIS — B9689 Other specified bacterial agents as the cause of diseases classified elsewhere: Secondary | ICD-10-CM | POA: Diagnosis present

## 2019-02-20 DIAGNOSIS — E1165 Type 2 diabetes mellitus with hyperglycemia: Secondary | ICD-10-CM | POA: Diagnosis present

## 2019-02-20 DIAGNOSIS — G629 Polyneuropathy, unspecified: Secondary | ICD-10-CM | POA: Diagnosis present

## 2019-02-20 DIAGNOSIS — F1910 Other psychoactive substance abuse, uncomplicated: Secondary | ICD-10-CM | POA: Diagnosis present

## 2019-02-20 DIAGNOSIS — E1151 Type 2 diabetes mellitus with diabetic peripheral angiopathy without gangrene: Secondary | ICD-10-CM | POA: Diagnosis present

## 2019-02-20 DIAGNOSIS — U071 COVID-19: Principal | ICD-10-CM | POA: Diagnosis present

## 2019-02-20 DIAGNOSIS — Z89431 Acquired absence of right foot: Secondary | ICD-10-CM

## 2019-02-20 DIAGNOSIS — B182 Chronic viral hepatitis C: Secondary | ICD-10-CM | POA: Diagnosis present

## 2019-02-20 DIAGNOSIS — Z981 Arthrodesis status: Secondary | ICD-10-CM

## 2019-02-20 DIAGNOSIS — K219 Gastro-esophageal reflux disease without esophagitis: Secondary | ICD-10-CM | POA: Diagnosis present

## 2019-02-20 DIAGNOSIS — Z79899 Other long term (current) drug therapy: Secondary | ICD-10-CM

## 2019-02-20 DIAGNOSIS — R0602 Shortness of breath: Secondary | ICD-10-CM

## 2019-02-20 DIAGNOSIS — Z4781 Encounter for orthopedic aftercare following surgical amputation: Secondary | ICD-10-CM | POA: Diagnosis not present

## 2019-02-20 DIAGNOSIS — Z8249 Family history of ischemic heart disease and other diseases of the circulatory system: Secondary | ICD-10-CM

## 2019-02-20 DIAGNOSIS — Z8701 Personal history of pneumonia (recurrent): Secondary | ICD-10-CM

## 2019-02-20 DIAGNOSIS — R32 Unspecified urinary incontinence: Secondary | ICD-10-CM | POA: Diagnosis present

## 2019-02-20 DIAGNOSIS — I120 Hypertensive chronic kidney disease with stage 5 chronic kidney disease or end stage renal disease: Secondary | ICD-10-CM | POA: Diagnosis present

## 2019-02-20 DIAGNOSIS — N1832 Chronic kidney disease, stage 3b: Secondary | ICD-10-CM | POA: Diagnosis present

## 2019-02-20 DIAGNOSIS — I10 Essential (primary) hypertension: Secondary | ICD-10-CM | POA: Diagnosis present

## 2019-02-20 DIAGNOSIS — J9621 Acute and chronic respiratory failure with hypoxia: Secondary | ICD-10-CM | POA: Diagnosis present

## 2019-02-20 DIAGNOSIS — R2689 Other abnormalities of gait and mobility: Secondary | ICD-10-CM | POA: Diagnosis present

## 2019-02-20 DIAGNOSIS — M25519 Pain in unspecified shoulder: Secondary | ICD-10-CM | POA: Diagnosis present

## 2019-02-20 DIAGNOSIS — J9601 Acute respiratory failure with hypoxia: Secondary | ICD-10-CM | POA: Diagnosis present

## 2019-02-20 DIAGNOSIS — Z7401 Bed confinement status: Secondary | ICD-10-CM | POA: Diagnosis not present

## 2019-02-20 DIAGNOSIS — Z833 Family history of diabetes mellitus: Secondary | ICD-10-CM

## 2019-02-20 DIAGNOSIS — N1831 Chronic kidney disease, stage 3a: Secondary | ICD-10-CM | POA: Diagnosis not present

## 2019-02-20 DIAGNOSIS — D631 Anemia in chronic kidney disease: Secondary | ICD-10-CM | POA: Diagnosis present

## 2019-02-20 DIAGNOSIS — E785 Hyperlipidemia, unspecified: Secondary | ICD-10-CM | POA: Diagnosis present

## 2019-02-20 DIAGNOSIS — E1121 Type 2 diabetes mellitus with diabetic nephropathy: Secondary | ICD-10-CM | POA: Diagnosis present

## 2019-02-20 DIAGNOSIS — E1142 Type 2 diabetes mellitus with diabetic polyneuropathy: Secondary | ICD-10-CM | POA: Diagnosis present

## 2019-02-20 DIAGNOSIS — M6281 Muscle weakness (generalized): Secondary | ICD-10-CM | POA: Diagnosis present

## 2019-02-20 DIAGNOSIS — J45909 Unspecified asthma, uncomplicated: Secondary | ICD-10-CM | POA: Diagnosis present

## 2019-02-20 DIAGNOSIS — R0902 Hypoxemia: Secondary | ICD-10-CM | POA: Diagnosis not present

## 2019-02-20 DIAGNOSIS — B1710 Acute hepatitis C without hepatic coma: Secondary | ICD-10-CM | POA: Diagnosis present

## 2019-02-20 LAB — COMPREHENSIVE METABOLIC PANEL
ALT: 13 U/L (ref 0–44)
AST: 12 U/L — ABNORMAL LOW (ref 15–41)
Albumin: 3.3 g/dL — ABNORMAL LOW (ref 3.5–5.0)
Alkaline Phosphatase: 52 U/L (ref 38–126)
Anion gap: 5 (ref 5–15)
BUN: 19 mg/dL (ref 8–23)
CO2: 33 mmol/L — ABNORMAL HIGH (ref 22–32)
Calcium: 9.1 mg/dL (ref 8.9–10.3)
Chloride: 99 mmol/L (ref 98–111)
Creatinine, Ser: 1.22 mg/dL (ref 0.61–1.24)
GFR calc Af Amer: >60 mL/min (ref >60)
GFR calc non Af Amer: >60 mL/min (ref >60)
Glucose, Bld: 164 mg/dL — ABNORMAL HIGH (ref 70–99)
Potassium: 4.4 mmol/L (ref 3.5–5.1)
Sodium: 137 mmol/L (ref 135–145)
Total Bilirubin: 0.5 mg/dL (ref 0.3–1.2)
Total Protein: 9 g/dL — ABNORMAL HIGH (ref 6.5–8.1)

## 2019-02-20 LAB — URINALYSIS, ROUTINE W REFLEX MICROSCOPIC
Bilirubin Urine: NEGATIVE
Glucose, UA: NEGATIVE mg/dL
Hgb urine dipstick: NEGATIVE
Ketones, ur: NEGATIVE mg/dL
Nitrite: POSITIVE — AB
Protein, ur: NEGATIVE mg/dL
Specific Gravity, Urine: 1.014 (ref 1.005–1.030)
WBC, UA: >50 WBC/hpf — ABNORMAL HIGH (ref 0–5)
pH: 5 (ref 5.0–8.0)

## 2019-02-20 LAB — PROCALCITONIN: Procalcitonin: <0.1 ng/mL

## 2019-02-20 LAB — FERRITIN: Ferritin: 49 ng/mL (ref 24–336)

## 2019-02-20 LAB — LACTIC ACID, PLASMA
Lactic Acid, Venous: 0.7 mmol/L (ref 0.5–1.9)
Lactic Acid, Venous: 0.8 mmol/L (ref 0.5–1.9)

## 2019-02-20 LAB — CBC WITH DIFFERENTIAL/PLATELET
Abs Immature Granulocytes: 0.02 10*3/uL (ref 0.00–0.07)
Basophils Absolute: 0 10*3/uL (ref 0.0–0.1)
Basophils Relative: 0 %
Eosinophils Absolute: 0.1 10*3/uL (ref 0.0–0.5)
Eosinophils Relative: 1 %
HCT: 30.4 % — ABNORMAL LOW (ref 39.0–52.0)
Hemoglobin: 9.2 g/dL — ABNORMAL LOW (ref 13.0–17.0)
Immature Granulocytes: 0 %
Lymphocytes Relative: 37 %
Lymphs Abs: 2.3 10*3/uL (ref 0.7–4.0)
MCH: 29.9 pg (ref 26.0–34.0)
MCHC: 30.3 g/dL (ref 30.0–36.0)
MCV: 98.7 fL (ref 80.0–100.0)
Monocytes Absolute: 0.5 10*3/uL (ref 0.1–1.0)
Monocytes Relative: 8 %
Neutro Abs: 3.4 10*3/uL (ref 1.7–7.7)
Neutrophils Relative %: 54 %
Platelets: 262 10*3/uL (ref 150–400)
RBC: 3.08 MIL/uL — ABNORMAL LOW (ref 4.22–5.81)
RDW: 14 % (ref 11.5–15.5)
WBC: 6.3 10*3/uL (ref 4.0–10.5)
nRBC: 0 % (ref 0.0–0.2)

## 2019-02-20 LAB — GLUCOSE, CAPILLARY: Glucose-Capillary: 283 mg/dL — ABNORMAL HIGH (ref 70–99)

## 2019-02-20 LAB — C-REACTIVE PROTEIN: CRP: 1.2 mg/dL — ABNORMAL HIGH (ref <1.0)

## 2019-02-20 LAB — TRIGLYCERIDES: Triglycerides: 127 mg/dL (ref <150)

## 2019-02-20 LAB — FIBRINOGEN: Fibrinogen: 542 mg/dL — ABNORMAL HIGH (ref 210–475)

## 2019-02-20 LAB — D-DIMER, QUANTITATIVE: D-Dimer, Quant: 1.39 ug/mL-FEU — ABNORMAL HIGH (ref 0.00–0.50)

## 2019-02-20 LAB — POC SARS CORONAVIRUS 2 AG -  ED: SARS Coronavirus 2 Ag: POSITIVE — AB

## 2019-02-20 LAB — LACTATE DEHYDROGENASE: LDH: 129 U/L (ref 98–192)

## 2019-02-20 MED ORDER — ATORVASTATIN CALCIUM 10 MG PO TABS
10.0000 mg | ORAL_TABLET | Freq: Every day | ORAL | Status: DC
  Administered 2019-02-21 – 2019-02-25 (×5): 10 mg via ORAL
  Filled 2019-02-20 (×5): qty 1

## 2019-02-20 MED ORDER — HYDROCOD POLST-CPM POLST ER 10-8 MG/5ML PO SUER
5.0000 mL | Freq: Two times a day (BID) | ORAL | Status: DC | PRN
  Administered 2019-02-20: 5 mL via ORAL
  Filled 2019-02-20: qty 5

## 2019-02-20 MED ORDER — SENNOSIDES-DOCUSATE SODIUM 8.6-50 MG PO TABS
1.0000 | ORAL_TABLET | Freq: Every day | ORAL | Status: DC
  Administered 2019-02-20 – 2019-02-22 (×3): 1 via ORAL
  Filled 2019-02-20 (×3): qty 1

## 2019-02-20 MED ORDER — CARVEDILOL 3.125 MG PO TABS
3.1250 mg | ORAL_TABLET | Freq: Two times a day (BID) | ORAL | Status: DC
  Administered 2019-02-21 – 2019-02-25 (×10): 3.125 mg via ORAL
  Filled 2019-02-20 (×10): qty 1

## 2019-02-20 MED ORDER — SODIUM CHLORIDE 0.9 % IV SOLN
200.0000 mg | Freq: Once | INTRAVENOUS | Status: AC
  Administered 2019-02-20: 15:00:00 200 mg via INTRAVENOUS
  Filled 2019-02-20: qty 40

## 2019-02-20 MED ORDER — ASCORBIC ACID 500 MG PO TABS
500.0000 mg | ORAL_TABLET | Freq: Every day | ORAL | Status: DC
  Administered 2019-02-21 – 2019-02-25 (×5): 500 mg via ORAL
  Filled 2019-02-20 (×5): qty 1

## 2019-02-20 MED ORDER — INSULIN ASPART 100 UNIT/ML ~~LOC~~ SOLN
0.0000 [IU] | Freq: Three times a day (TID) | SUBCUTANEOUS | Status: DC
  Administered 2019-02-21: 3 [IU] via SUBCUTANEOUS
  Administered 2019-02-21: 4 [IU] via SUBCUTANEOUS
  Administered 2019-02-22: 3 [IU] via SUBCUTANEOUS
  Administered 2019-02-22 – 2019-02-23 (×2): 4 [IU] via SUBCUTANEOUS
  Administered 2019-02-23: 7 [IU] via SUBCUTANEOUS
  Administered 2019-02-23: 11 [IU] via SUBCUTANEOUS
  Administered 2019-02-24: 4 [IU] via SUBCUTANEOUS
  Administered 2019-02-24: 7 [IU] via SUBCUTANEOUS
  Administered 2019-02-24: 4 [IU] via SUBCUTANEOUS
  Administered 2019-02-25: 11 [IU] via SUBCUTANEOUS
  Administered 2019-02-25 (×2): 4 [IU] via SUBCUTANEOUS

## 2019-02-20 MED ORDER — TRAZODONE HCL 50 MG PO TABS
25.0000 mg | ORAL_TABLET | Freq: Every evening | ORAL | Status: DC | PRN
  Administered 2019-02-21: 25 mg via ORAL
  Filled 2019-02-20: qty 1

## 2019-02-20 MED ORDER — ONDANSETRON HCL 4 MG/2ML IJ SOLN: 4.0000 mg | Freq: Four times a day (QID) | INTRAMUSCULAR | Status: DC | PRN

## 2019-02-20 MED ORDER — ACETAMINOPHEN 325 MG PO TABS: 650.0000 mg | ORAL_TABLET | Freq: Four times a day (QID) | ORAL | Status: DC | PRN

## 2019-02-20 MED ORDER — INSULIN ASPART 100 UNIT/ML ~~LOC~~ SOLN
10.0000 [IU] | Freq: Three times a day (TID) | SUBCUTANEOUS | Status: DC
  Administered 2019-02-21 – 2019-02-25 (×13): 10 [IU] via SUBCUTANEOUS

## 2019-02-20 MED ORDER — DEXAMETHASONE SODIUM PHOSPHATE 10 MG/ML IJ SOLN: 6.0000 mg | Freq: Once | INTRAMUSCULAR | Status: DC

## 2019-02-20 MED ORDER — SODIUM CHLORIDE 0.9 % IV SOLN
100.0000 mg | Freq: Every day | INTRAVENOUS | Status: AC
  Administered 2019-02-21 – 2019-02-24 (×4): 100 mg via INTRAVENOUS
  Filled 2019-02-20 (×3): qty 20
  Filled 2019-02-20: qty 100

## 2019-02-20 MED ORDER — INSULIN GLARGINE 100 UNIT/ML ~~LOC~~ SOLN
25.0000 [IU] | Freq: Every day | SUBCUTANEOUS | Status: DC
  Administered 2019-02-20 – 2019-02-22 (×3): 25 [IU] via SUBCUTANEOUS
  Filled 2019-02-20 (×4): qty 0.25

## 2019-02-20 MED ORDER — ALBUTEROL SULFATE HFA 108 (90 BASE) MCG/ACT IN AERS
2.0000 | INHALATION_SPRAY | Freq: Four times a day (QID) | RESPIRATORY_TRACT | Status: DC
  Administered 2019-02-20 – 2019-02-21 (×2): 2 via RESPIRATORY_TRACT

## 2019-02-20 MED ORDER — DEXAMETHASONE SODIUM PHOSPHATE 10 MG/ML IJ SOLN
10.0000 mg | Freq: Once | INTRAMUSCULAR | Status: AC
  Administered 2019-02-20: 10 mg via INTRAVENOUS
  Filled 2019-02-20: qty 1

## 2019-02-20 MED ORDER — SODIUM CHLORIDE 0.9 % IV SOLN: INTRAVENOUS | Status: DC

## 2019-02-20 MED ORDER — ONDANSETRON HCL 4 MG PO TABS: 4.0000 mg | ORAL_TABLET | Freq: Four times a day (QID) | ORAL | Status: DC | PRN

## 2019-02-20 MED ORDER — ALBUTEROL SULFATE HFA 108 (90 BASE) MCG/ACT IN AERS
2.0000 | INHALATION_SPRAY | Freq: Once | RESPIRATORY_TRACT | Status: AC
  Administered 2019-02-20: 2 via RESPIRATORY_TRACT
  Filled 2019-02-20: qty 6.7

## 2019-02-20 MED ORDER — CLOPIDOGREL BISULFATE 75 MG PO TABS
75.0000 mg | ORAL_TABLET | Freq: Every day | ORAL | Status: DC
  Administered 2019-02-21 – 2019-02-25 (×5): 75 mg via ORAL
  Filled 2019-02-20 (×5): qty 1

## 2019-02-20 MED ORDER — GABAPENTIN 300 MG PO CAPS
600.0000 mg | ORAL_CAPSULE | Freq: Three times a day (TID) | ORAL | Status: DC
  Administered 2019-02-20 – 2019-02-25 (×15): 600 mg via ORAL
  Filled 2019-02-20 (×15): qty 2

## 2019-02-20 MED ORDER — OXYCODONE HCL 5 MG PO TABS
5.0000 mg | ORAL_TABLET | Freq: Four times a day (QID) | ORAL | Status: DC | PRN
  Administered 2019-02-20 – 2019-02-21 (×2): 5 mg via ORAL
  Filled 2019-02-20 (×3): qty 1

## 2019-02-20 MED ORDER — INSULIN ASPART 100 UNIT/ML ~~LOC~~ SOLN
0.0000 [IU] | Freq: Every day | SUBCUTANEOUS | Status: DC
  Administered 2019-02-20: 22:00:00 3 [IU] via SUBCUTANEOUS
  Administered 2019-02-22 – 2019-02-23 (×2): 2 [IU] via SUBCUTANEOUS
  Administered 2019-02-24: 21:00:00 4 [IU] via SUBCUTANEOUS

## 2019-02-20 MED ORDER — ENOXAPARIN SODIUM 40 MG/0.4ML ~~LOC~~ SOLN
40.0000 mg | SUBCUTANEOUS | Status: DC
  Administered 2019-02-20 – 2019-02-21 (×2): 40 mg via SUBCUTANEOUS
  Filled 2019-02-20 (×2): qty 0.4

## 2019-02-20 MED ORDER — VITAMIN D 25 MCG (1000 UNIT) PO TABS
50.0000 ug | ORAL_TABLET | Freq: Every day | ORAL | Status: DC
  Administered 2019-02-21 – 2019-02-25 (×5): 50 ug via ORAL
  Filled 2019-02-20 (×4): qty 2
  Filled 2019-02-20: qty 1

## 2019-02-20 MED ORDER — ASPIRIN EC 81 MG PO TBEC
81.0000 mg | DELAYED_RELEASE_TABLET | Freq: Every day | ORAL | Status: DC
  Administered 2019-02-21 – 2019-02-25 (×5): 81 mg via ORAL
  Filled 2019-02-20 (×5): qty 1

## 2019-02-20 MED ORDER — DEXAMETHASONE 4 MG PO TABS
6.0000 mg | ORAL_TABLET | ORAL | Status: DC
  Administered 2019-02-21 – 2019-02-25 (×5): 6 mg via ORAL
  Filled 2019-02-20 (×6): qty 2

## 2019-02-20 MED ORDER — GUAIFENESIN-DM 100-10 MG/5ML PO SYRP: 10.0000 mL | ORAL_SOLUTION | ORAL | Status: DC | PRN

## 2019-02-20 MED ORDER — BACLOFEN 10 MG PO TABS
5.0000 mg | ORAL_TABLET | Freq: Three times a day (TID) | ORAL | Status: DC | PRN
  Administered 2019-02-22 – 2019-02-25 (×3): 5 mg via ORAL
  Filled 2019-02-20 (×3): qty 1

## 2019-02-20 MED ORDER — TAMSULOSIN HCL 0.4 MG PO CAPS
0.4000 mg | ORAL_CAPSULE | Freq: Every day | ORAL | Status: DC
  Administered 2019-02-21 – 2019-02-25 (×5): 0.4 mg via ORAL
  Filled 2019-02-20 (×5): qty 1

## 2019-02-20 MED ORDER — PANTOPRAZOLE SODIUM 40 MG PO TBEC
40.0000 mg | DELAYED_RELEASE_TABLET | Freq: Every day | ORAL | Status: DC
  Administered 2019-02-21 – 2019-02-25 (×5): 40 mg via ORAL
  Filled 2019-02-20 (×5): qty 1

## 2019-02-20 MED ORDER — VITAMIN B-12 1000 MCG PO TABS
1000.0000 ug | ORAL_TABLET | Freq: Every day | ORAL | Status: DC
  Administered 2019-02-21 – 2019-02-25 (×5): 1000 ug via ORAL
  Filled 2019-02-20 (×5): qty 1

## 2019-02-20 MED ORDER — POLYETHYLENE GLYCOL 3350 17 G PO PACK
17.0000 g | PACK | Freq: Every day | ORAL | Status: DC
  Administered 2019-02-21 – 2019-02-23 (×3): 17 g via ORAL
  Filled 2019-02-20 (×3): qty 1

## 2019-02-20 MED ORDER — SODIUM CHLORIDE 0.9 % IV SOLN
1.0000 g | INTRAVENOUS | Status: AC
  Administered 2019-02-20 – 2019-02-22 (×3): 1 g via INTRAVENOUS
  Filled 2019-02-20 (×3): qty 10

## 2019-02-20 MED ORDER — BACLOFEN 5 MG PO TABS: 5.0000 mg | ORAL_TABLET | Freq: Three times a day (TID) | ORAL | Status: DC | PRN

## 2019-02-20 MED ORDER — ZINC SULFATE 220 (50 ZN) MG PO CAPS
220.0000 mg | ORAL_CAPSULE | Freq: Every day | ORAL | Status: DC
  Administered 2019-02-21 – 2019-02-25 (×5): 220 mg via ORAL
  Filled 2019-02-20 (×5): qty 1

## 2019-02-20 NOTE — ED Triage Notes (Signed)
Sent from [facility] for shortness of breath.  Pt covid +.  Pt awake alert and appears to have no difficulty breathing.  Pt sats range from 86-91 on r/a.

## 2019-02-20 NOTE — H&P (Addendum)
History and Physical  [facility]  [name] WGN:[id number] DOB: [birth year] DOA: 02/20/2019  PCP: Iona Beard, MD   Patient coming from: [facility]   I have personally briefly reviewed patient's old medical records in [place]  Chief Complaint: Shortness of breath   HPI: Cory Weber is a 69 y.o. male with a complex medical history significant for diastolic CHF, BPH, stage III CKD, type 2 diabetes mellitus, cerebrovascular disease status post CVA, chronic hepatitis C, history of substance abuse and alcohol, GERD who is a longtime resident of the [facility].  He reports that he was diagnosed with COVID-19 infection several days ago at the facility.  He reports that he has continued to have increasing symptoms of shortness of breath and fatigue and weakness.  He has had a persistent dry cough chills no fever and nasal congestion.  He denies having chest pain.  Orts urinary frequency.  He denies nausea vomiting.  He was sent to the emergency department due to complaints of increasing shortness of breath.  He was noted to be hypoxic at the facility with a pulse ox in the 86% range on room air.  ED Course: Hypoxic on arrival 86% on room air placed on 2 L supplemental oxygen with improvement into the low 90s.  Patient symptoms improved after supplemental oxygen.  Temp 98.7, pulse 80, blood pressure 132/76, blood sugar 164, triglycerides 127, ferritin 49, CRP 1.2, procalcitonin less than 0.10, WBC 6.3, hemoglobin 9.2 D-dimer 1.39, fibrinogen 542, urinalysis with large leukocytes and nitrite positive and greater than 50 WBC per high-power field, SARS 2 coronavirus test positive.  Review of Systems: As per HPI otherwise 10 point review of systems negative.   Past Medical History:  Diagnosis Date   Anemia    Asthma    BPH (benign prostatic hyperplasia)    Cellulitis and abscess of foot 07/2015   Chronic diastolic heart failure ([place])    per Nursing facility   Chronic  kidney disease    CKD stage 3   CVA (cerebral vascular accident) ([place]) 2015   denies residual on 08/15/2015   GERD (gastroesophageal reflux disease)    Hepatitis C    states he was diagnosed in 2007 or 2007 while living in [place], [place]   Hepatitis C    Hyperlipidemia    Hypertension    Ischemia of foot 05/31/2016   Osteomyelitis ([place])    per Nursing Facility   Peripheral neuropathy    Pneumonia X 1   PVD (peripheral vascular disease) ([place])    Type II diabetes mellitus ([place])    Wet gangrene ([place]) 06/10/2016    Past Surgical History:  Procedure Laterality Date   ABDOMINAL AORTOGRAM N/A 06/03/2016   Procedure: Abdominal Aortogram;  Surgeon: Waynetta Sandy, MD;  Location: [facility];  Service: Cardiovascular;  Laterality: N/A;   AMPUTATION Left 08/16/2015   Procedure: LEFT BELOW THE KNEE AMPUTATION ;  Surgeon: Serafina Mitchell, MD;  Location: [place];  Service: Vascular;  Laterality: Left;   AMPUTATION Right 08/22/2016   Procedure: AMPUTATION BELOW KNEE-RIGHT;  Surgeon: Waynetta Sandy, MD;  Location: [place];  Service: Vascular;  Laterality: Right;   BACK SURGERY     BELOW KNEE LEG AMPUTATION Left 08/16/2015   BIOPSY N/A 09/03/2012   Procedure: BIOPSY;  Surgeon: Daneil Dolin, MD;  Location: [facility];  Service: Endoscopy;  Laterality: N/A;  gastric and gastric mucosa   BIOPSY N/A 12/03/2012   Procedure:  BIOPSY;  Surgeon: Daneil Dolin, MD;  Location: [facility];  Service: Endoscopy;  Laterality: N/A;   COLONOSCOPY WITH PROPOFOL N/A 09/03/2012   UYQ:IHKVQQV polyp-removed as outlined above. Prominent internal hemorrhoids. Tubular adenoma   ESOPHAGOGASTRODUODENOSCOPY (EGD) WITH PROPOFOL N/A 09/03/2012   ZDG:LOVFIE hernia. Gastric diverticulum. Gastric ulcers with associated erosions. Duodenal erosions. Status post gastric biopsy. H.PYLORI gastritis    ESOPHAGOGASTRODUODENOSCOPY (EGD) WITH PROPOFOL N/A 12/03/2012   Dr. Gala Romney: gastric diverticulum,  gastric erosions and scar. Previously noted gastric ulcer completed healed. Biopsy without H.pylori.    ESOPHAGOGASTRODUODENOSCOPY (EGD) WITH PROPOFOL N/A 01/23/2016   Procedure: ESOPHAGOGASTRODUODENOSCOPY (EGD) WITH PROPOFOL;  Surgeon: Doran Stabler, MD;  [facility]: [facility];  Service: Gastroenterology;  Laterality: N/A;   I & D EXTREMITY Right 07/11/2016   Procedure: DELAY PRIMARY CLOSURE FOOT AMPUTATION;  Surgeon: Edrick Kins, DPM;  Location: [place];  Service: Podiatry;  Laterality: Right;   LIVER BIOPSY  2005   Done in [place], [place]. Chronic hepatitis with mild periportal inflammation, lobular unicellular necrosis and portal fibrosis. Grade 2, stage 1-2.   LOWER EXTREMITY ANGIOGRAPHY Right 06/03/2016   Procedure: Lower Extremity Angiography;  Surgeon: Waynetta Sandy, MD;  Location: [place];  Service: Cardiovascular;  Laterality: Right;   MAXIMUM ACCESS (MAS)POSTERIOR LUMBAR INTERBODY FUSION (PLIF) 1 LEVEL N/A 11/25/2013   Procedure: FOR MAXIMUM ACCESS (MAS) POSTERIOR LUMBAR INTERBODY FUSION (PLIF) 1 LEVEL;  Surgeon: Eustace Moore, MD;  Location: [facility];  Service: Neurosurgery;  Laterality: N/A;  FOR MAXIMUM ACCESS (MAS) POSTERIOR LUMBAR INTERBODY FUSION (PLIF) 1 LEVEL LUMBAR 3-4   PERIPHERAL VASCULAR ATHERECTOMY Right 06/03/2016   Procedure: Peripheral Vascular Atherectomy;  Surgeon: Waynetta Sandy, MD;  Location: [place] [facility];  Service: Cardiovascular;  Laterality: Right;  SFA WITH STENT   PERIPHERAL VASCULAR CATHETERIZATION Left 08/01/2015   Procedure: Lower Extremity Angiography;  Surgeon: Serafina Mitchell, MD;  Location: [place] [facility];  Service: Cardiovascular;  Laterality: Left;   PERIPHERAL VASCULAR CATHETERIZATION N/A 08/01/2015   Procedure: Abdominal Aortogram;  Surgeon: Serafina Mitchell, MD;  Location: [facility];  Service: Cardiovascular;  Laterality: N/A;   PERIPHERAL VASCULAR CATHETERIZATION N/A 08/08/2015    Procedure: Abdominal Aortogram w/Lower Extremity;  Surgeon: Serafina Mitchell, MD;  Location: [place] [facility];  Service: Cardiovascular;  Laterality: N/A;   PERIPHERAL VASCULAR CATHETERIZATION Left 08/08/2015   Procedure: Peripheral Vascular Balloon Angioplasty;  Surgeon: Serafina Mitchell, MD;  Location: [facility];  Service: Cardiovascular;  Laterality: Left;  left popiteal artery, left peronealtrunk, left post tibial   POLYPECTOMY N/A 09/03/2012   Procedure: POLYPECTOMY;  Surgeon: Daneil Dolin, MD;  [facility];  Service: Endoscopy;  Laterality: N/A;  cecal polyp   TONSILLECTOMY     TRANSMETATARSAL AMPUTATION Right 06/13/2016   Procedure: RIGHT TRANSMETATARSAL AMPUTATION;  Surgeon: Waynetta Sandy, MD;  Location: [place];  Service: Vascular;  Laterality: Right;   TRANSMETATARSAL AMPUTATION Right 07/10/2016   Procedure: REVISION RIGHT TRANSMETATARSAL AMPUTATION;  Surgeon: Waynetta Sandy, MD;  Location: [place];  Service: Vascular;  Laterality: Right;     reports that he quit smoking about 3 years ago. His smoking use included cigarettes. He has a 47.00 pack-year smoking history. He has never used smokeless tobacco. He reports that he does not drink alcohol or use drugs.  Allergies  Allergen Reactions   No Known Allergies     Family History  Problem Relation Age of Onset  Breast cancer Mother    Heart failure Mother    Diabetes Father    Hypertension Father    Heart disease Father    Hyperlipidemia Father    Diabetes Sister    Hypertension Sister    Breast cancer Sister    Colon cancer Neg Hx    Liver disease Neg Hx      Prior to Admission medications   Medication Sig Start Date End Date Taking? Authorizing Provider  acetaminophen (TYLENOL) 325 MG tablet Take 650 mg by mouth every 4 (four) hours as needed for fever. For temp greater than 100.4    [provider]  aspirin EC 81 MG tablet Take 81 mg by mouth daily.    [provider]  baclofen (LIORESAL) 10 MG tablet  08/22/18   [provider]  Baclofen 5 MG TABS  09/18/18   [provider]  carvedilol (COREG) 3.125 MG tablet Take 1 tablet (3.125 mg total) by mouth 2 (two) times daily with a meal. 08/10/15   Cristal Ford, DO  Cholecalciferol (VITAMIN D3) 2000 units TABS Take 1 tablet by mouth daily.    [provider]  clopidogrel (PLAVIX) 75 MG tablet Take 1 tablet (75 mg total) by mouth daily with breakfast. 06/05/16   Dhungel, Nishant, MD  ferrous sulfate 325 (65 FE) MG tablet Take 325 mg by mouth 2 (two) times daily.     [provider]  gabapentin (NEURONTIN) 600 MG tablet Take 1 tablet (600 mg total) by mouth 3 (three) times daily. 07/15/16   Domenic Polite, MD  insulin aspart (NOVOLOG) 100 UNIT/ML injection Inject 5 Units into the skin 3 (three) times daily after meals. Patient taking differently: Inject 2-8 Units into the skin as directed. Inject 5 units 3 times daily after meals.  Inject 3 times daily before meals as per sliding scale: 150-200= 2 units, 201-250= 4 units, 251-300= 6 units, 301-350= 8 units, if >251 notify MD 07/15/16   Domenic Polite, MD  insulin glargine (LANTUS) 100 UNIT/ML injection Inject 0.25 mLs (25 Units total) into the skin at bedtime. Patient taking differently: Inject 20 Units into the skin at bedtime.  07/15/16   Domenic Polite, MD  lisinopril (PRINIVIL,ZESTRIL) 5 MG tablet Take 5 mg by mouth daily.     [provider]  magnesium hydroxide (MILK OF MAGNESIA) 400 MG/5ML suspension Take 30 mLs by mouth daily as needed for mild constipation.    [provider]  metFORMIN (GLUCOPHAGE) 500 MG tablet Take 500 mg by mouth 2 (two) times daily with a meal.     [provider]  senna-docusate (SENOKOT-S) 8.6-50 MG tablet Take 1 tablet by mouth 2 (two) times daily. Patient taking differently: Take 1 tablet by mouth 2 (two) times daily. Hold for loose stools 06/17/16   Rama, Venetia Maxon, MD  simvastatin (ZOCOR) 20 MG tablet Take 20 mg by mouth daily.     [provider]  tamsulosin (FLOMAX) 0.4 MG CAPS capsule Take 1 capsule (0.4 mg total) by mouth daily. 08/10/15   Mikhail, Velta Addison, DO  torsemide (DEMADEX) 20 MG tablet Take 2 tablets (40 mg total) by mouth daily. 09/22/18 12/21/18  Strader, Fransisco Hertz, PA-C  vitamin C (ASCORBIC ACID) 500 MG tablet Take 500 mg by mouth 2 (two) times daily.    [provider]    Physical Exam: Vitals:   02/20/19 1215 02/20/19 1230 02/20/19 1245 02/20/19 1300  BP:  115/67  135/72  Pulse: 84 91 93  Resp: (!) 22 16 (!) 22 16  Temp:      TempSrc:      SpO2: 99% 97% 98%   Weight:      Height:       Constitutional: NAD, calm, comfortable while on supplemental oxygen.  Eyes: PERRL, lids and conjunctivae normal ENMT: Mucous membranes are moist. Posterior pharynx clear of any exudate or lesions.  Neck: normal, supple, no masses, no thyromegaly Respiratory: rales heard posteriorly on right,  no wheezing, no crackles. Normal respiratory effort. No accessory muscle use.  Cardiovascular: Regular rate and rhythm, no murmurs / rubs / gallops.  Abdomen: no tenderness, no masses palpated. No hepatosplenomegaly. Bowel sounds positive.  Musculoskeletal: bilateral LE amputee.  Skin: no rashes, lesions, ulcers. No induration Neurologic: CN 2-12 grossly intact.    Psychiatric: Normal judgment and insight. Alert and oriented x 3. Normal mood.   Labs on Admission: I have personally reviewed following labs and imaging studies  CBC: Recent Labs  Lab 02/20/19 1202  WBC 6.3  NEUTROABS 3.4  HGB 9.2*  HCT 30.4*  MCV 98.7  PLT 169   Basic Metabolic Panel: Recent Labs  Lab 02/20/19 1202  NA 137  K 4.4  CL 99  CO2 33*  GLUCOSE 164*  BUN 19  CREATININE 1.22  CALCIUM 9.1   GFR: Estimated Creatinine Clearance: 79 mL/min (by C-G formula based on SCr of 1.22 mg/dL). Liver Function Tests: Recent Labs  Lab 02/20/19 1202  AST  12*  ALT 13  ALKPHOS 52  BILITOT 0.5  PROT 9.0*  ALBUMIN 3.3*   No results for input(s): LIPASE, AMYLASE in the last 168 hours. No results for input(s): AMMONIA in the last 168 hours. Coagulation Profile: No results for input(s): INR, PROTIME in the last 168 hours. Cardiac Enzymes: No results for input(s): CKTOTAL, CKMB, CKMBINDEX, TROPONINI in the last 168 hours. BNP (last 3 results) No results for input(s): PROBNP in the last 8760 hours. HbA1C: No results for input(s): HGBA1C in the last 72 hours. CBG: No results for input(s): GLUCAP in the last 168 hours. Lipid Profile: Recent Labs    02/20/19 1202  TRIG 127   Thyroid Function Tests: No results for input(s): TSH, T4TOTAL, FREET4, T3FREE, THYROIDAB in the last 72 hours. Anemia Panel: Recent Labs    02/20/19 1202  FERRITIN 49   Urine analysis:    Component Value Date/Time   COLORURINE YELLOW 02/20/2019 1348   APPEARANCEUR HAZY (A) 02/20/2019 1348   LABSPEC 1.014 02/20/2019 1348   PHURINE 5.0 02/20/2019 1348   GLUCOSEU NEGATIVE 02/20/2019 1348   HGBUR NEGATIVE 02/20/2019 [place] 02/20/2019 [place] 02/20/2019 1348   PROTEINUR NEGATIVE 02/20/2019 1348   UROBILINOGEN 1.0 11/29/2013 1705   NITRITE POSITIVE (A) 02/20/2019 1348   LEUKOCYTESUR LARGE (A) 02/20/2019 1348    Radiological Exams on Admission: DG Chest Port 1 View  Result Date: 02/20/2019 CLINICAL DATA:  Shortness of breath.  COVID-19 positive EXAM: PORTABLE CHEST 1 VIEW COMPARISON:  August 24, 2016 FINDINGS: The lungs are clear. Heart is upper normal in size with pulmonary vascularity normal. No adenopathy. No bone lesions. A benign exostosis is noted along the inferior aspect of the distal right clavicle, stable. IMPRESSION: Lungs clear.  Stable cardiac silhouette.  No adenopathy. Electronically Signed   By: Lowella Grip III M.D.   On: 02/20/2019 11:40   Assessment/Plan Principal Problem:   Acute respiratory  failure with hypoxia (HCC) Active Problems:   COVID-19 virus  detected   Polysubstance abuse ([place])   Tobacco abuse   S/P lumbar spinal fusion   Chronic kidney disease, stage 3   Essential hypertension   Chronic hepatitis C without hepatic coma (HCC)   History of CVA (cerebrovascular accident) without residual deficits   Anemia of chronic disease   BPH (benign prostatic hyperplasia)   GERD (gastroesophageal reflux disease)   Dyslipidemia associated with type 2 diabetes mellitus (HCC)   Chronic gout due to renal impairment involving toe of right foot without tophus   Physical deconditioning   Chronic diastolic CHF (congestive heart failure) (HCC)   S/P transmetatarsal amputation of foot, right (HCC)   PAD (peripheral artery disease) ([place])   UTI (urinary tract infection)   DNR (do not resuscitate)    1. Acute respiratory failure with hypoxia - secondary to COVID-19 infection.  He has a new oxygen requirement and given his comorbidities he is at high risk for complications.  We will admit him and treat him aggressively.  He has been started on supplemental oxygen.  We will follow closely. 2. COVID-19 infection-patient has been started on the order set bundle.  Begin Decadron IV.  Remdesivir per pharmacy consult.  Monitor and trend inflammatory markers.  Start vitamin supplementation.  Check vitamin D level.  Supplement if needed.  Recheck chest x-ray in a.m.  Gentle hydration ordered. 3. Stage III CKD-we will follow creatinine closely with daily testing. 4. Chronic diastolic CHF - pt is clinically dehydrated, temporarily hold torsemide while giving gentle hydration.  5. Essential hypertension-plan to resume home medications when reconciled.  Follow BP closely. 6. Type 2 diabetes mellitus-check hemoglobin A1c, we anticipate steroid-induced hyperglycemia.  Resume basal insulin and prandial coverage and supplemental sliding scale coverage and titrate doses as needed for better glycemic control.   Hold home Metformin while inpatient. 7. Cerebrovascular disease status post CVA-continue meds for secondary prevention. 8. Dyslipidemia-resume statin therapy. 9. GERD-Protonix ordered for GI protection. 10. Anemia in CKD - Hg stable at baseline of 9.  Follow.  11. UTI - he is having dysuria symptoms and will treat with ceftriaxone IV x 3 days.  12. DNR present on admission - will continue order while inpatient.   DVT prophylaxis: Lovenox Code Status: Full   Family Communication:   Disposition Plan: IV medications for Covid 19 treatment  Consults called:   Admission status: INP   Natthew Marlatt MD [facility] How to contact the [facility] Attending or Consulting provider [place] or covering provider during after hours [place], for this patient?  1. Check the care team in [facility] and look for a) attending/consulting [facility] provider listed and b) the [facility] team listed 2. Log into [email or web address] and use [place]'s universal password to access. If you do not have the password, please contact the hospital operator. 3. Locate the [facility] provider you are looking for under [facility] and page to a number that you can be directly reached. 4. If you still have difficulty reaching the provider, please page the [facility] (Director on Call) for the Hospitalists listed on amion for assistance.   If 7PM-7AM, please contact night-coverage [email or web address] Password [email or web address]  02/20/2019, 2:54 PM

## 2019-02-20 NOTE — ED Provider Notes (Addendum)
[facility] Provider Note   CSN: [id number] Arrival date & time: 02/20/19  1031     History Chief Complaint  Patient presents with  . Shortness of Breath    Cory Weber is a 69 y.o. male with a history of CHF last EF 55-60%, hypertension, hyperlipidemia, T2DM, hepatitis C, CKD, & polysubstance abuse who presents to the ED from [facility] care facility for increasing dyspnea for the past few days. Patient tested positive for COVID 19 a few days ago, he states he has been having progressively worsening dyspnea since with associated chills, dry cough, nasal congestion, and also mentions some increased urination. No alleviating/aggravating factors to his sxs. Sent to ED for further assessment. Denies fever, chest pain, N/V/D, melena, hematochezia, abdominal pain, or dysuria. Patient states he does not wear oxygen @ baseline.   HPI     Past Medical History:  Diagnosis Date  . Anemia   . Asthma   . BPH (benign prostatic hyperplasia)   . Cellulitis and abscess of foot 07/2015  . Chronic diastolic heart failure ([place])    per Nursing facility  . Chronic kidney disease    CKD stage 3  . CVA (cerebral vascular accident) ([place]) 2015   denies residual on 08/15/2015  . GERD (gastroesophageal reflux disease)   . Hepatitis C    states he was diagnosed in 2007 or 2007 while living in [place], [place]  . Hepatitis C   . Hyperlipidemia   . Hypertension   . Ischemia of foot 05/31/2016  . Osteomyelitis ([place])    per Nursing Facility  . Peripheral neuropathy   . Pneumonia X 1  . PVD (peripheral vascular disease) ([place])   . Type II diabetes mellitus ([place])   . Wet gangrene ([place]) 06/10/2016    Patient Active Problem List   Diagnosis Date Noted  . PAD (peripheral artery disease) ([place]) 08/22/2016  . Osteomyelitis of foot ([place]) 07/08/2016  . S/P transmetatarsal amputation of foot, right ([place]) 06/26/2016  . Constipation due to opioid therapy 06/15/2016  . Physical deconditioning  06/10/2016  . Wet gangrene ([place]) 06/10/2016  . Chronic diastolic CHF (congestive heart failure) ([place]) 06/10/2016  . Ischemic pain of right foot 06/04/2016  . Ischemia of foot 05/31/2016  . 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 Dyslipidemia associated with type 2 diabetes mellitus ([place]) 01/21/2016  . Uncontrolled type II diabetes with peripheral autonomic neuropathy ([place]) 01/17/2016  . Weight gain 12/20/2015  . Edema of right lower extremity 12/20/2015  . Cough 11/26/2015  . GERD (gastroesophageal reflux disease) 10/08/2015  . Amputation of left lower extremity below knee ([place]) 08/21/2015  . Phantom limb pain ([place])   . Anemia of chronic disease   . BPH (benign prostatic hyperplasia)   . ETOH abuse   . PVD (peripheral vascular disease) ([place])   . Chronic hepatitis C without hepatic coma ([place])   . History of CVA (cerebrovascular accident) without residual deficits   . Hyponatremia   . Chronic kidney disease, stage 3 07/18/2015  . Essential hypertension 07/18/2015  . S/P lumbar spinal fusion 11/25/2013  . Melena 12/03/2011  . Hemiparesthesia 02/03/2011  . Polysubstance abuse ([place]) 02/03/2011  . Tobacco abuse 02/03/2011    Past Surgical History:  Procedure Laterality Date  . ABDOMINAL AORTOGRAM N/A 06/03/2016   Procedure: Abdominal Aortogram;  Surgeon: Waynetta Sandy, MD;  Location: [facility];  Service: Cardiovascular;  Laterality: N/A;  . AMPUTATION  Left 08/16/2015   Procedure: LEFT BELOW THE KNEE AMPUTATION ;  Surgeon: Serafina Mitchell, MD;  Location: [facility];  Service: Vascular;  Laterality: Left;  . AMPUTATION Right 08/22/2016   Procedure: AMPUTATION BELOW KNEE-RIGHT;  Surgeon: Waynetta Sandy, MD;  Location: [facility];  Service: Vascular;  Laterality: Right;  . BACK SURGERY    . BELOW KNEE LEG AMPUTATION Left 08/16/2015  . BIOPSY N/A 09/03/2012   Procedure: BIOPSY;  Surgeon: Daneil Dolin, MD;  [facility];  Service: Endoscopy;  Laterality: N/A;  gastric and gastric mucosa  . BIOPSY N/A 12/03/2012   Procedure: BIOPSY;  Surgeon: Daneil Dolin, MD;  Location: [facility];  Service: Endoscopy;  Laterality: N/A;  . COLONOSCOPY WITH PROPOFOL N/A 09/03/2012   BPZ:WCHENID polyp-removed as outlined above. Prominent internal hemorrhoids. Tubular adenoma  . ESOPHAGOGASTRODUODENOSCOPY (EGD) WITH PROPOFOL N/A 09/03/2012   POE:UMPNTI hernia. Gastric diverticulum. Gastric ulcers with associated erosions. Duodenal erosions. Status post gastric biopsy. H.PYLORI gastritis   . ESOPHAGOGASTRODUODENOSCOPY (EGD) WITH PROPOFOL N/A 12/03/2012   Dr. Gala Romney: gastric diverticulum, gastric erosions and scar. Previously noted gastric ulcer completed healed. Biopsy without H.pylori.   . ESOPHAGOGASTRODUODENOSCOPY (EGD) WITH PROPOFOL N/A 01/23/2016   Procedure: ESOPHAGOGASTRODUODENOSCOPY (EGD) WITH PROPOFOL;  Surgeon: Doran Stabler, MD;  [facility];  Service: Gastroenterology;  Laterality: N/A;  . I & D EXTREMITY Right 07/11/2016   Procedure: DELAY PRIMARY CLOSURE FOOT AMPUTATION;  Surgeon: Edrick Kins, DPM;  Location: [facility];  Service: Podiatry;  Laterality: Right;  . LIVER BIOPSY  2005   Done in [place], [place]. Chronic hepatitis with mild periportal inflammation, lobular unicellular necrosis and portal fibrosis. Grade 2, stage 1-2.  . LOWER EXTREMITY ANGIOGRAPHY Right 06/03/2016   Procedure: Lower Extremity Angiography;  Surgeon: Waynetta Sandy, MD;  Location: [facility];  Service: Cardiovascular;  Laterality: Right;  . MAXIMUM ACCESS (MAS)POSTERIOR LUMBAR INTERBODY FUSION (PLIF) 1 LEVEL N/A 11/25/2013   Procedure: FOR MAXIMUM ACCESS (MAS) POSTERIOR LUMBAR INTERBODY FUSION (PLIF) 1 LEVEL;  Surgeon: Eustace Moore, MD;  Location: [facility];  Service: Neurosurgery;  Laterality: N/A;  FOR MAXIMUM ACCESS (MAS) POSTERIOR LUMBAR INTERBODY FUSION (PLIF) 1 LEVEL LUMBAR 3-4  . PERIPHERAL VASCULAR  ATHERECTOMY Right 06/03/2016   Procedure: Peripheral Vascular Atherectomy;  Surgeon: Waynetta Sandy, MD;  Location: [facility];  Service: Cardiovascular;  Laterality: Right;  SFA WITH STENT  . PERIPHERAL VASCULAR CATHETERIZATION Left 08/01/2015   Procedure: Lower Extremity Angiography;  Surgeon: Serafina Mitchell, MD;  Location: [facility];  Service: Cardiovascular;  Laterality: Left;  . PERIPHERAL VASCULAR CATHETERIZATION N/A 08/01/2015   Procedure: Abdominal Aortogram;  Surgeon: Serafina Mitchell, MD;  Location: [facility];  Service: Cardiovascular;  Laterality: N/A;  . PERIPHERAL VASCULAR CATHETERIZATION N/A 08/08/2015   Procedure: Abdominal Aortogram w/Lower Extremity;  Surgeon: Serafina Mitchell, MD;  Location: [facility];  Service: Cardiovascular;  Laterality: N/A;  . PERIPHERAL VASCULAR CATHETERIZATION Left 08/08/2015   Procedure: Peripheral Vascular Balloon Angioplasty;  Surgeon: Serafina Mitchell, MD;  Location: [facility];  Service: Cardiovascular;  Laterality: Left;  left popiteal artery, left peronealtrunk, left post tibial  . POLYPECTOMY N/A 09/03/2012   Procedure: POLYPECTOMY;  Surgeon: Daneil Dolin, MD;  [facility];  Service: Endoscopy;  Laterality: N/A;  cecal polyp  . TONSILLECTOMY    . TRANSMETATARSAL AMPUTATION Right 06/13/2016   Procedure: RIGHT TRANSMETATARSAL AMPUTATION;  Surgeon: Waynetta Sandy,  MD;  Location: [facility];  Service: Vascular;  Laterality: Right;  . TRANSMETATARSAL AMPUTATION Right 07/10/2016   Procedure: REVISION RIGHT TRANSMETATARSAL AMPUTATION;  Surgeon: Waynetta Sandy, MD;  Location: [facility];  Service: Vascular;  Laterality: Right;       Family History  Problem Relation Age of Onset  . Breast cancer Mother   . Heart failure Mother   . Diabetes Father   . Hypertension Father   . Heart disease Father   . Hyperlipidemia Father   . Diabetes Sister   . Hypertension Sister   . Breast cancer  Sister   . Colon cancer Neg Hx   . Liver disease Neg Hx     Social History   Tobacco Use  . Smoking status: Former Smoker    Packs/day: 1.00    Years: 47.00    Pack years: 47.00    Types: Cigarettes    Quit date: 07/13/2015    Years since quitting: 3.6  . Smokeless tobacco: Never Used  Substance Use Topics  . Alcohol use: No    Alcohol/week: 0.0 standard drinks    Comment:  none 08/15/2015 "stopped drinking 3-4 years ago"  . Drug use: No    Comment: 08/15/2015 "last used cocaine 3-4 months ago"    Home Medications Prior to Admission medications   Medication Sig Start Date End Date Taking? Authorizing Provider  acetaminophen (TYLENOL) 325 MG tablet Take 650 mg by mouth every 4 (four) hours as needed for fever. For temp greater than 100.4    [provider]  aspirin EC 81 MG tablet Take 81 mg by mouth daily.    [provider]  baclofen (LIORESAL) 10 MG tablet  08/22/18   [provider]  Baclofen 5 MG TABS  09/18/18   [provider]  carvedilol (COREG) 3.125 MG tablet Take 1 tablet (3.125 mg total) by mouth 2 (two) times daily with a meal. 08/10/15   Cristal Ford, DO  Cholecalciferol (VITAMIN D3) 2000 units TABS Take 1 tablet by mouth daily.    [provider]  clopidogrel (PLAVIX) 75 MG tablet Take 1 tablet (75 mg total) by mouth daily with breakfast. 06/05/16   Dhungel, Nishant, MD  ferrous sulfate 325 (65 FE) MG tablet Take 325 mg by mouth 2 (two) times daily.     [provider]  gabapentin (NEURONTIN) 600 MG tablet Take 1 tablet (600 mg total) by mouth 3 (three) times daily. 07/15/16   Domenic Polite, MD  insulin aspart (NOVOLOG) 100 UNIT/ML injection Inject 5 Units into the skin 3 (three) times daily after meals. Patient taking differently: Inject 2-8 Units into the skin as directed. Inject 5 units 3 times daily after meals.  Inject 3 times daily before meals as per sliding scale: 150-200= 2 units, 201-250= 4 units, 251-300= 6  units, 301-350= 8 units, if >251 notify MD 07/15/16   Domenic Polite, MD  insulin glargine (LANTUS) 100 UNIT/ML injection Inject 0.25 mLs (25 Units total) into the skin at bedtime. Patient taking differently: Inject 20 Units into the skin at bedtime.  07/15/16   Domenic Polite, MD  lisinopril (PRINIVIL,ZESTRIL) 5 MG tablet Take 5 mg by mouth daily.     [provider]  magnesium hydroxide (MILK OF MAGNESIA) 400 MG/5ML suspension Take 30 mLs by mouth daily as needed for mild constipation.    [provider]  metFORMIN (GLUCOPHAGE) 500 MG tablet Take 500 mg by mouth 2 (two) times daily with a meal.  [provider]  senna-docusate (SENOKOT-S) 8.6-50 MG tablet Take 1 tablet by mouth 2 (two) times daily. Patient taking differently: Take 1 tablet by mouth 2 (two) times daily. Hold for loose stools 06/17/16   Rama, Venetia Maxon, MD  simvastatin (ZOCOR) 20 MG tablet Take 20 mg by mouth daily.     [provider]  tamsulosin (FLOMAX) 0.4 MG CAPS capsule Take 1 capsule (0.4 mg total) by mouth daily. 08/10/15   Mikhail, Velta Addison, DO  torsemide (DEMADEX) 20 MG tablet Take 2 tablets (40 mg total) by mouth daily. 09/22/18 12/21/18  Strader, Fransisco Hertz, PA-C  vitamin C (ASCORBIC ACID) 500 MG tablet Take 500 mg by mouth 2 (two) times daily.    [provider]    Allergies    No known allergies  Review of Systems   Review of Systems  Constitutional: Negative for chills and fever.  HENT: Positive for congestion.   Respiratory: Positive for cough and shortness of breath.   Cardiovascular: Negative for chest pain.  Gastrointestinal: Negative for abdominal pain, diarrhea, nausea and vomiting.  Genitourinary: Positive for frequency. Negative for dysuria.  Neurological: Negative for syncope.  All other systems reviewed and are negative.   Physical Exam Updated Vital Signs BP (!) 147/86 (BP Location: Right Arm)   Pulse 85   Temp 98.7 F (37.1 C) (Oral)   Resp 18    Ht 6' (1.829 m)   Wt 118 kg   SpO2 99%   BMI 35.28 kg/m   Physical Exam Vitals and nursing note reviewed.  Constitutional:      General: He is not in acute distress.    Appearance: He is well-developed. He is not toxic-appearing.  HENT:     Head: Normocephalic and atraumatic.  Eyes:     General:        Right eye: No discharge.        Left eye: No discharge.     Conjunctiva/sclera: Conjunctivae normal.  Cardiovascular:     Rate and Rhythm: Normal rate and regular rhythm.  Pulmonary:     Effort: No respiratory distress.     Breath sounds: No wheezing, rhonchi or rales.     Comments: Patient desaturated to 87% on RA, improved with application of 2L via [place].  Abdominal:     General: There is no distension.     Palpations: Abdomen is soft.     Tenderness: There is no abdominal tenderness.  Genitourinary:    Epididymis:     Right: No tenderness.     Left: No tenderness.  Musculoskeletal:     Cervical back: Neck supple.     Comments: S/p bilateral BKA.   Skin:    General: Skin is warm and dry.     Findings: No rash.  Neurological:     Mental Status: He is alert.     Comments: Clear speech.   Psychiatric:        Behavior: Behavior normal.    ED Results / Procedures / Treatments   Labs (all labs ordered are listed, but only abnormal results are displayed) Labs Reviewed  CBC WITH DIFFERENTIAL/PLATELET - Abnormal; Notable for the following components:      Result Value   RBC 3.08 (*)    Hemoglobin 9.2 (*)    HCT 30.4 (*)    All other components within normal limits  COMPREHENSIVE METABOLIC PANEL - Abnormal; Notable for the following components:   CO2 33 (*)    Glucose, Bld 164 (*)  Total Protein 9.0 (*)    Albumin 3.3 (*)    AST 12 (*)    All other components within normal limits  D-DIMER, QUANTITATIVE (NOT AT [facility]) - Abnormal; Notable for the following components:   D-Dimer, Quant 1.39 (*)    All other components within normal limits  FIBRINOGEN - Abnormal;  Notable for the following components:   Fibrinogen 542 (*)    All other components within normal limits  POC SARS CORONAVIRUS 2 AG -  ED - Abnormal; Notable for the following components:   SARS Coronavirus 2 Ag POSITIVE (*)    All other components within normal limits  CULTURE, BLOOD (ROUTINE X 2)  CULTURE, BLOOD (ROUTINE X 2)  URINE CULTURE  LACTIC ACID, PLASMA  PROCALCITONIN  LACTATE DEHYDROGENASE  TRIGLYCERIDES  LACTIC ACID, PLASMA  FERRITIN  C-REACTIVE PROTEIN  URINALYSIS, ROUTINE W REFLEX MICROSCOPIC    EKG EKG Interpretation  Date/Time:  Saturday February 20 2019 11:39:31 EST Ventricular Rate:  82 PR Interval:    QRS Duration: 87 QT Interval:  365 QTC Calculation: 427 R Axis:   58 Text Interpretation: Sinus rhythm Probable inferior infarct, age indeterminate No significant change since last tracing Confirmed by Fredia Sorrow [phone]) on 02/20/2019 11:45:26 AM   Radiology DG Chest Port 1 View  Result Date: 02/20/2019 CLINICAL DATA:  Shortness of breath.  COVID-19 positive EXAM: PORTABLE CHEST 1 VIEW COMPARISON:  August 24, 2016 FINDINGS: The lungs are clear. Heart is upper normal in size with pulmonary vascularity normal. No adenopathy. No bone lesions. A benign exostosis is noted along the inferior aspect of the distal right clavicle, stable. IMPRESSION: Lungs clear.  Stable cardiac silhouette.  No adenopathy. Electronically Signed   By: Lowella Grip III M.D.   On: 02/20/2019 11:40    Procedures .Critical Care Performed by: Amaryllis Dyke, PA-C Authorized by: Amaryllis Dyke, PA-C     (including critical care time)  CRITICAL CARE Performed by: Kennith Maes   Total critical care time: 30 minutes  Critical care time was exclusive of separately billable procedures and treating other patients.  Critical care was necessary to treat or prevent imminent or life-threatening deterioration.  Critical care was time spent personally by me on  the following activities: development of treatment plan with patient and/or surrogate as well as nursing, discussions with consultants, evaluation of patient's response to treatment, examination of patient, obtaining history from patient or surrogate, ordering and performing treatments and interventions, ordering and review of laboratory studies, ordering and review of radiographic studies, pulse oximetry and re-evaluation of patient's condition.   Medications Ordered in ED Medications  dexamethasone (DECADRON) injection 6 mg (has no administration in time range)  albuterol (VENTOLIN HFA) 108 (90 Base) MCG/ACT inhaler 2 puff (2 puffs Inhalation Given 02/20/19 1223)    ED Course  I have reviewed the triage vital signs and the nursing notes.  Pertinent labs & imaging results that were available during my care of the patient were reviewed by me and considered in my medical decision making (see chart for details).    Cory Weber was evaluated in Emergency Department on 02/20/2019 for the symptoms described in the history of present illness. He/she was evaluated in the context of the global COVID-19 pandemic, which necessitated consideration that the patient might be at risk for infection with the SARS-CoV-2 virus that causes COVID-19. Institutional protocols and algorithms that pertain to the evaluation of patients at risk for COVID-19 are in a state of rapid change  based on information released by regulatory bodies including the CDC and federal and state organizations. These policies and algorithms were followed during the patient's care in the ED.  MDM Rules/Calculators/A&P                      Patient with known positive covid 19 testing presents to the ED for evaluation of worsening dyspnea. Patient is nontoxic appearing, hypoxic to 87% on RA, improved with application of 2L via [place], vitals otherwise without significant abnormality. Lungs clear. Abdomen nontender.  COVID positive EKG without  significant change from prior.  CXR without significant acute abnormality.  CBC: No leukocytosis or leukopenia. Anemia improved from prior.  CMP: Hyperglycemia without acidosis or anion gap elevation. Bicarb mildly elevated.  Several elevated inflammatory markers including d-dimer- will discuss CTA with hospitalist service.   Given new oxygen requirement in setting of COVID 19 will consult hospitalist service for admission.   This is a shared visit with supervising physician Dr. Rogene Houston who has independently evaluated patient & provided guidance in evaluation/management/disposition, in agreement with care   13:20: CONSULT: Discussed with hospitalist Dr. Wynetta Emery- accepts admission, okay with holding off on CTA, appreciate consultation.   Final Clinical Impression(s) / ED Diagnoses Final diagnoses:  COVID-19  Acute respiratory failure with hypoxia [facility])    Rx / DC Orders ED Discharge Orders    None       Amaryllis Dyke, PA-C 02/20/19 [place], PA-C 02/20/19 1326    Fredia Sorrow, MD 02/21/19 0745

## 2019-02-21 ENCOUNTER — Other Ambulatory Visit: Payer: Self-pay

## 2019-02-21 ENCOUNTER — Inpatient Hospital Stay (HOSPITAL_COMMUNITY): Payer: Medicare Other

## 2019-02-21 DIAGNOSIS — N390 Urinary tract infection, site not specified: Secondary | ICD-10-CM

## 2019-02-21 DIAGNOSIS — D638 Anemia in other chronic diseases classified elsewhere: Secondary | ICD-10-CM

## 2019-02-21 LAB — COMPREHENSIVE METABOLIC PANEL
ALT: 12 U/L (ref 0–44)
AST: 13 U/L — ABNORMAL LOW (ref 15–41)
Albumin: 3.3 g/dL — ABNORMAL LOW (ref 3.5–5.0)
Alkaline Phosphatase: 47 U/L (ref 38–126)
Anion gap: 9 (ref 5–15)
BUN: 22 mg/dL (ref 8–23)
CO2: 29 mmol/L (ref 22–32)
Calcium: 8.9 mg/dL (ref 8.9–10.3)
Chloride: 98 mmol/L (ref 98–111)
Creatinine, Ser: 1.1 mg/dL (ref 0.61–1.24)
GFR calc Af Amer: >60 mL/min (ref >60)
GFR calc non Af Amer: >60 mL/min (ref >60)
Glucose, Bld: 222 mg/dL — ABNORMAL HIGH (ref 70–99)
Potassium: 4.8 mmol/L (ref 3.5–5.1)
Sodium: 136 mmol/L (ref 135–145)
Total Bilirubin: 0.4 mg/dL (ref 0.3–1.2)
Total Protein: 9 g/dL — ABNORMAL HIGH (ref 6.5–8.1)

## 2019-02-21 LAB — CBC WITH DIFFERENTIAL/PLATELET
Abs Immature Granulocytes: 0.02 10*3/uL (ref 0.00–0.07)
Basophils Absolute: 0 10*3/uL (ref 0.0–0.1)
Basophils Relative: 0 %
Eosinophils Absolute: 0 10*3/uL (ref 0.0–0.5)
Eosinophils Relative: 0 %
HCT: 28.3 % — ABNORMAL LOW (ref 39.0–52.0)
Hemoglobin: 8.6 g/dL — ABNORMAL LOW (ref 13.0–17.0)
Immature Granulocytes: 0 %
Lymphocytes Relative: 23 %
Lymphs Abs: 1.2 10*3/uL (ref 0.7–4.0)
MCH: 29.7 pg (ref 26.0–34.0)
MCHC: 30.4 g/dL (ref 30.0–36.0)
MCV: 97.6 fL (ref 80.0–100.0)
Monocytes Absolute: 0.4 10*3/uL (ref 0.1–1.0)
Monocytes Relative: 7 %
Neutro Abs: 3.7 10*3/uL (ref 1.7–7.7)
Neutrophils Relative %: 70 %
Platelets: 260 10*3/uL (ref 150–400)
RBC: 2.9 MIL/uL — ABNORMAL LOW (ref 4.22–5.81)
RDW: 13.6 % (ref 11.5–15.5)
WBC: 5.3 10*3/uL (ref 4.0–10.5)
nRBC: 0 % (ref 0.0–0.2)

## 2019-02-21 LAB — GLUCOSE, CAPILLARY
Glucose-Capillary: 219 mg/dL — ABNORMAL HIGH (ref 70–99)
Glucose-Capillary: 178 mg/dL — ABNORMAL HIGH (ref 70–99)
Glucose-Capillary: 147 mg/dL — ABNORMAL HIGH (ref 70–99)
Glucose-Capillary: 92 mg/dL (ref 70–99)
Glucose-Capillary: 174 mg/dL — ABNORMAL HIGH (ref 70–99)

## 2019-02-21 LAB — FERRITIN: Ferritin: 48 ng/mL (ref 24–336)

## 2019-02-21 LAB — ABO/RH: ABO/RH(D): O POS

## 2019-02-21 LAB — PHOSPHORUS: Phosphorus: 3.3 mg/dL (ref 2.5–4.6)

## 2019-02-21 LAB — D-DIMER, QUANTITATIVE: D-Dimer, Quant: 1.06 ug/mL-FEU — ABNORMAL HIGH (ref 0.00–0.50)

## 2019-02-21 LAB — C-REACTIVE PROTEIN: CRP: 0.9 mg/dL (ref <1.0)

## 2019-02-21 LAB — VITAMIN D 25 HYDROXY (VIT D DEFICIENCY, FRACTURES): Vit D, 25-Hydroxy: 49.02 ng/mL (ref 30–100)

## 2019-02-21 LAB — MAGNESIUM: Magnesium: 2.1 mg/dL (ref 1.7–2.4)

## 2019-02-21 LAB — HIV ANTIBODY (ROUTINE TESTING W REFLEX): HIV Screen 4th Generation wRfx: NONREACTIVE

## 2019-02-21 MED ORDER — ALBUTEROL SULFATE HFA 108 (90 BASE) MCG/ACT IN AERS: 2.0000 | INHALATION_SPRAY | Freq: Four times a day (QID) | RESPIRATORY_TRACT | Status: DC | PRN

## 2019-02-21 MED ORDER — OXYCODONE HCL 5 MG PO TABS
5.0000 mg | ORAL_TABLET | Freq: Four times a day (QID) | ORAL | Status: DC | PRN
  Administered 2019-02-21 – 2019-02-25 (×11): 10 mg via ORAL
  Filled 2019-02-21 (×11): qty 2

## 2019-02-21 MED ORDER — TRAZODONE HCL 50 MG PO TABS
50.0000 mg | ORAL_TABLET | Freq: Every day | ORAL | Status: DC
  Administered 2019-02-21 – 2019-02-24 (×4): 50 mg via ORAL
  Filled 2019-02-21 (×4): qty 1

## 2019-02-21 NOTE — Plan of Care (Signed)

## 2019-02-21 NOTE — Progress Notes (Signed)
PROGRESS NOTE    Cory Weber  JGG:[id number] DOB: 09/07/1950 DOA: 02/20/2019 PCP: Iona Beard, MD     Brief Narrative:  As per H&P by Dr. Wynetta Emery on 02/20/19 69 y.o. male with a complex medical history significant for diastolic CHF, BPH, stage III CKD, type 2 diabetes mellitus, cerebrovascular disease status post CVA, chronic hepatitis C, history of substance abuse and alcohol, GERD who is a longtime resident of the [facility].  He reports that he was diagnosed with COVID-19 infection several days ago at the facility.  He reports that he has continued to have increasing symptoms of shortness of breath and fatigue and weakness.  He has had a persistent dry cough chills no fever and nasal congestion.  He denies having chest pain.  Orts urinary frequency.  He denies nausea vomiting.  He was sent to the emergency department due to complaints of increasing shortness of breath.  He was noted to be hypoxic at the facility with a pulse ox in the 86% range on room air.  ED Course: Hypoxic on arrival 86% on room air placed on 2 L supplemental oxygen with improvement into the low 90s.  Patient symptoms improved after supplemental oxygen.  Temp 98.7, pulse 80, blood pressure 132/76, blood sugar 164, triglycerides 127, ferritin 49, CRP 1.2, procalcitonin less than 0.10, WBC 6.3, hemoglobin 9.2 D-dimer 1.39, fibrinogen 542, urinalysis with large leukocytes and nitrite positive and greater than 50 WBC per high-power field, SARS 2 coronavirus test positive.   Assessment & Plan: 1-acute respiratory failure with hypoxia ([place]) -In the setting of COVID-19 pneumonia -Continue IV steroids and IV remdesivir -Continue oxygen supplementation and wean it off as tolerated -Continue vitamin C and zinc -As needed bronchodilators will be provided -Patient encouraged to continue using incentive spirometer to exercise his lungs and improve respiratory effort. -Continue as needed bronchodilators  2-Chronic kidney  disease, stage 3 -Appears to be stable and at baseline -Follow renal function trend -Advised to maintain adequate hydration.  3-Essential hypertension -Currently stable -Continue current antihypertensive regimen and slowly resolved rest of his medications prior to admission as needed.  4-Chronic hepatitis C without hepatic coma ([place]) -Continue outpatient follow-up with gastroenterology service -No signs of active infection appreciated LFTs within normal limits.  5-History of CVA (cerebrovascular accident) without residual deficits -No acute neurologic deficits -Continue secondary prevention -Continue statins, aspirin and Plavix.  6-UTI -Follow urine culture -Continue empiric Rocephin. -Patient reports increased frequency and mild dysuria.  7-gastroesophageal reflux disease/GI protection -Continue PPI  8-type 2 diabetes mellitus with nephropathy -Continue holding oral hypoglycemic agents -Continue sliding scale insulin and Levemir -Follow CBGs and adjust hypoglycemic regimen as required -With the use of a steroids hyperglycemia could be appreciated.  9-anemia in the setting of chronic kidney disease -Hemoglobin around 9 at baseline -No signs of overt bleed -Follow hemoglobin trend.  10-peripheral arterial disease and bilateral amputation -Continue aspirin and Plavix -Continue statins -Bilateral stumps are without signs of acute ulcers or infection. -Continue reflux notifications.  11-DNR -Patient with no issues to be a DO NOT RESUSCITATE prior to admission -Will respect and honor his wishes -Continue treatment for acute condition as mentioned above.  DVT prophylaxis: Lovenox Code Status: DNR Family Communication: No family at bedside. Disposition Plan: Remains inpatient, continue IV remdesivir, IV steroids, adjustment analgesics and supportive care.  As needed trazodone will be initiated to assist with insomnia.  Continue supportive care and follow clinical response.   Wean off oxygen supplementation as tolerated.  Consultants:  None  Procedures:   Below for x-ray reports  Antimicrobials:  Anti-infectives (From admission, onward)   Start     Dose/Rate Route Frequency Ordered Stop   02/21/19 1000  remdesivir 100 mg in sodium chloride 0.9 % 100 mL IVPB     100 mg 200 mL/hr over 30 Minutes Intravenous Daily 02/20/19 1330 02/25/19 0959   02/20/19 2130  cefTRIAXone (ROCEPHIN) 1 g in sodium chloride 0.9 % 100 mL IVPB     1 g 200 mL/hr over 30 Minutes Intravenous Every 24 hours 02/20/19 2120 02/23/19 2129   02/20/19 1400  remdesivir 200 mg in sodium chloride 0.9% 250 mL IVPB     200 mg 580 mL/hr over 30 Minutes Intravenous Once 02/20/19 1330 02/20/19 1759      Subjective: Reports still feeling short of breath, no nausea, no vomiting, no chest pain.  Using 2.5 L nasal cannula supplementation and is currently afebrile.  Patient reports an insomnia and is still having generalized body aches and neuropathy.  Objective: Vitals:   02/21/19 0602 02/21/19 0725 02/21/19 1444 02/21/19 1511  BP: (!) 150/77  137/70   Pulse: 69  62   Resp: (!) 22  18   Temp: 97.8 F (36.6 C)  97.8 F (36.6 C)   TempSrc: Oral  Oral   SpO2: 99% 98% 98% 96%  Weight:      Height:        Intake/Output Summary (Last 24 hours) at 02/21/2019 1618 Last data filed at 02/21/2019 1600 Gross per 24 hour  Intake 1500.09 ml  Output 600 ml  Net 900.09 ml   Filed Weights   02/20/19 1037 02/20/19 2036  Weight: 118 kg 119.5 kg    Examination: General exam: Alert, awake, oriented x 3; currently afebrile, reporting feeling short of breath while performing activities and is using 2.5 L oxygen supplementation.  No nausea, no vomiting, no chest pain.  Positive intermittent mildly productive coughing spells. Respiratory system: Decreased breath sounds at the bases, no wheezing, no crackles, no using accessory muscles.  Cardiovascular system:RRR. No murmurs, rubs,  gallops. Gastrointestinal system: Abdomen is nondistended, soft and nontender. No organomegaly or masses felt. Normal bowel sounds heard. Central nervous system: Alert and oriented. No focal neurological deficits. Extremities: No cyanosis; no edema.  Bilateral BKA appreciated. Skin: No rashes, no petechiae. Psychiatry: Judgement and insight appear normal. Mood & affect appropriate.     Data Reviewed: I have personally reviewed following labs and imaging studies  CBC: Recent Labs  Lab 02/20/19 1202 02/21/19 0618  WBC 6.3 5.3  NEUTROABS 3.4 3.7  HGB 9.2* 8.6*  HCT 30.4* 28.3*  MCV 98.7 97.6  PLT 262 076   Basic Metabolic Panel: Recent Labs  Lab 02/20/19 1202 02/21/19 0618  NA 137 136  K 4.4 4.8  CL 99 98  CO2 33* 29  GLUCOSE 164* 222*  BUN 19 22  CREATININE 1.22 1.10  CALCIUM 9.1 8.9  MG  --  2.1  PHOS  --  3.3   GFR: Estimated Creatinine Clearance: 88.2 mL/min (by C-G formula based on SCr of 1.1 mg/dL).   Liver Function Tests: Recent Labs  Lab 02/20/19 1202 02/21/19 0618  AST 12* 13*  ALT 13 12  ALKPHOS 52 47  BILITOT 0.5 0.4  PROT 9.0* 9.0*  ALBUMIN 3.3* 3.3*   CBG: Recent Labs  Lab 02/20/19 2141 02/21/19 0254 02/21/19 0744 02/21/19 1138 02/21/19 1612  GLUCAP 283* 219* 178* 147* 92   Lipid Profile: Recent Labs  02/20/19 1202  TRIG 127   Anemia Panel: Recent Labs    02/20/19 1202 02/21/19 0618  FERRITIN 49 48   Urine analysis:    Component Value Date/Time   COLORURINE YELLOW 02/20/2019 1348   APPEARANCEUR HAZY (A) 02/20/2019 1348   LABSPEC 1.014 02/20/2019 1348   PHURINE 5.0 02/20/2019 1348   GLUCOSEU NEGATIVE 02/20/2019 1348   HGBUR NEGATIVE 02/20/2019 [place] 02/20/2019 1348   KETONESUR NEGATIVE 02/20/2019 1348   PROTEINUR NEGATIVE 02/20/2019 1348   UROBILINOGEN 1.0 11/29/2013 1705   NITRITE POSITIVE (A) 02/20/2019 1348   LEUKOCYTESUR LARGE (A) 02/20/2019 1348    Recent Results (from the past 240  hour(s))  Blood Culture (routine x 2)     Status: None (Preliminary result)   Collection Time: 02/20/19 12:01 PM   Specimen: BLOOD LEFT HAND  Result Value Ref Range Status   Specimen Description   Final    BLOOD LEFT HAND BOTTLES DRAWN AEROBIC AND ANAEROBIC   Special Requests   Final    Blood Culture adequate volume Performed at [facility], [address], [place], [place] [address]    Culture PENDING  Incomplete   Report Status PENDING  Incomplete  Blood Culture (routine x 2)     Status: None (Preliminary result)   Collection Time: 02/20/19 12:05 PM   Specimen: BLOOD RIGHT HAND  Result Value Ref Range Status   Specimen Description   Final    BLOOD RIGHT HAND BOTTLES DRAWN AEROBIC AND ANAEROBIC   Special Requests   Final    Blood Culture adequate volume Performed at [facility], [address], [place], [place] [address]    Culture PENDING  Incomplete   Report Status PENDING  Incomplete    Radiology Studies: Portable chest 1 View  Result Date: 02/21/2019 CLINICAL DATA:  Shortness of breath. COVID-19 positive. EXAM: PORTABLE CHEST 1 VIEW COMPARISON:  02/20/2019 FINDINGS: The cardiomediastinal silhouette is unchanged with upper limits of normal heart size. There is new subtle patchy opacity in the right mid lung. The lungs are otherwise clear. No pleural effusion or pneumothorax is identified. IMPRESSION: New mild right midlung opacity which may reflect early pneumonia. Electronically Signed   By: Logan Bores M.D.   On: 02/21/2019 07:59   DG Chest Port 1 View  Result Date: 02/20/2019 CLINICAL DATA:  Shortness of breath.  COVID-19 positive EXAM: PORTABLE CHEST 1 VIEW COMPARISON:  August 24, 2016 FINDINGS: The lungs are clear. Heart is upper normal in size with pulmonary vascularity normal. No adenopathy. No bone lesions. A benign exostosis is noted along the inferior aspect of the distal right clavicle, stable. IMPRESSION: Lungs clear.  Stable cardiac silhouette.  No adenopathy.  Electronically Signed   By: Lowella Grip III M.D.   On: 02/20/2019 11:40    Scheduled Meds: . vitamin C  500 mg Oral Daily  . aspirin EC  81 mg Oral Daily  . atorvastatin  10 mg Oral q1800  . carvedilol  3.125 mg Oral BID WC  . cholecalciferol  50 mcg Oral Daily  . clopidogrel  75 mg Oral Q breakfast  . dexamethasone  6 mg Oral Q24H  . enoxaparin (LOVENOX) injection  40 mg Subcutaneous Q24H  . gabapentin  600 mg Oral TID  . insulin aspart  0-20 Units Subcutaneous TID WC  . insulin aspart  0-5 Units Subcutaneous QHS  . insulin aspart  10 Units Subcutaneous TID WC  . insulin glargine  25 Units Subcutaneous QHS  .  pantoprazole  40 mg Oral Daily  . polyethylene glycol  17 g Oral Daily  . senna-docusate  1 tablet Oral QHS  . tamsulosin  0.4 mg Oral Daily  . traZODone  50 mg Oral QHS  . cyanocobalamin  1,000 mcg Oral Daily  . zinc sulfate  220 mg Oral Daily   Continuous Infusions: . sodium chloride 50 mL/hr at 02/21/19 1600  . cefTRIAXone (ROCEPHIN)  IV Stopped (02/20/19 2306)  . remdesivir 100 mg in NS 100 mL Stopped (02/21/19 0943)     LOS: 1 day    Time spent: 35 minutes.     Barton Dubois, MD [facility] Pager [phone]   02/21/2019, 4:18 PM

## 2019-02-22 LAB — COMPREHENSIVE METABOLIC PANEL
ALT: 14 U/L (ref 0–44)
AST: 12 U/L — ABNORMAL LOW (ref 15–41)
Albumin: 3.3 g/dL — ABNORMAL LOW (ref 3.5–5.0)
Alkaline Phosphatase: 46 U/L (ref 38–126)
Anion gap: 8 (ref 5–15)
BUN: 25 mg/dL — ABNORMAL HIGH (ref 8–23)
CO2: 30 mmol/L (ref 22–32)
Calcium: 8.9 mg/dL (ref 8.9–10.3)
Chloride: 98 mmol/L (ref 98–111)
Creatinine, Ser: 1.15 mg/dL (ref 0.61–1.24)
GFR calc Af Amer: >60 mL/min (ref >60)
GFR calc non Af Amer: >60 mL/min (ref >60)
Glucose, Bld: 223 mg/dL — ABNORMAL HIGH (ref 70–99)
Potassium: 4.8 mmol/L (ref 3.5–5.1)
Sodium: 136 mmol/L (ref 135–145)
Total Bilirubin: 0.2 mg/dL — ABNORMAL LOW (ref 0.3–1.2)
Total Protein: 8.7 g/dL — ABNORMAL HIGH (ref 6.5–8.1)

## 2019-02-22 LAB — GLUCOSE, CAPILLARY
Glucose-Capillary: 231 mg/dL — ABNORMAL HIGH (ref 70–99)
Glucose-Capillary: 177 mg/dL — ABNORMAL HIGH (ref 70–99)
Glucose-Capillary: 142 mg/dL — ABNORMAL HIGH (ref 70–99)
Glucose-Capillary: 88 mg/dL (ref 70–99)
Glucose-Capillary: 242 mg/dL — ABNORMAL HIGH (ref 70–99)

## 2019-02-22 LAB — CBC WITH DIFFERENTIAL/PLATELET
Abs Immature Granulocytes: 0.03 10*3/uL (ref 0.00–0.07)
Basophils Absolute: 0 10*3/uL (ref 0.0–0.1)
Basophils Relative: 0 %
Eosinophils Absolute: 0 10*3/uL (ref 0.0–0.5)
Eosinophils Relative: 0 %
HCT: 28.3 % — ABNORMAL LOW (ref 39.0–52.0)
Hemoglobin: 8.5 g/dL — ABNORMAL LOW (ref 13.0–17.0)
Immature Granulocytes: 1 %
Lymphocytes Relative: 22 %
Lymphs Abs: 1.3 10*3/uL (ref 0.7–4.0)
MCH: 29.4 pg (ref 26.0–34.0)
MCHC: 30 g/dL (ref 30.0–36.0)
MCV: 97.9 fL (ref 80.0–100.0)
Monocytes Absolute: 0.4 10*3/uL (ref 0.1–1.0)
Monocytes Relative: 6 %
Neutro Abs: 4.2 10*3/uL (ref 1.7–7.7)
Neutrophils Relative %: 71 %
Platelets: 255 10*3/uL (ref 150–400)
RBC: 2.89 MIL/uL — ABNORMAL LOW (ref 4.22–5.81)
RDW: 13.7 % (ref 11.5–15.5)
WBC: 5.8 10*3/uL (ref 4.0–10.5)
nRBC: 0 % (ref 0.0–0.2)

## 2019-02-22 LAB — PHOSPHORUS: Phosphorus: 3.3 mg/dL (ref 2.5–4.6)

## 2019-02-22 LAB — HEMOGLOBIN A1C
Hgb A1c MFr Bld: 6.8 % — ABNORMAL HIGH (ref 4.8–5.6)
Mean Plasma Glucose: 148 mg/dL

## 2019-02-22 LAB — C-REACTIVE PROTEIN: CRP: 0.6 mg/dL (ref <1.0)

## 2019-02-22 LAB — FERRITIN: Ferritin: 74 ng/mL (ref 24–336)

## 2019-02-22 LAB — MRSA PCR SCREENING: MRSA by PCR: NEGATIVE

## 2019-02-22 LAB — MAGNESIUM: Magnesium: 2.3 mg/dL (ref 1.7–2.4)

## 2019-02-22 LAB — D-DIMER, QUANTITATIVE: D-Dimer, Quant: 0.68 ug/mL-FEU — ABNORMAL HIGH (ref 0.00–0.50)

## 2019-02-22 MED ORDER — ENOXAPARIN SODIUM 60 MG/0.6ML ~~LOC~~ SOLN
60.0000 mg | SUBCUTANEOUS | Status: DC
  Administered 2019-02-22 – 2019-02-24 (×3): 60 mg via SUBCUTANEOUS
  Filled 2019-02-22 (×3): qty 0.6

## 2019-02-22 NOTE — Care Management Important Message (Signed)
Important Message  Patient Details  Name: Cory Weber MRN: [id number] Date of Birth: 1950-08-21   Medicare Important Message Given:  Yes(Patricia, RN will deliver letter due to contact precautions)     [name] 02/22/2019, 1:27 PM

## 2019-02-22 NOTE — Progress Notes (Signed)
PROGRESS NOTE    Cory Weber  GUY:[id number] DOB: [birth year] DOA: 02/20/2019 PCP: Iona Beard, MD     Brief Narrative:  As per H&P by Dr. Wynetta Emery on 02/20/19 69 y.o. male with a complex medical history significant for diastolic CHF, BPH, stage III CKD, type 2 diabetes mellitus, cerebrovascular disease status post CVA, chronic hepatitis C, history of substance abuse and alcohol, GERD who is a longtime resident of the [facility].  He reports that he was diagnosed with COVID-19 infection several days ago at the facility.  He reports that he has continued to have increasing symptoms of shortness of breath and fatigue and weakness.  He has had a persistent dry cough chills no fever and nasal congestion.  He denies having chest pain.  Orts urinary frequency.  He denies nausea vomiting.  He was sent to the emergency department due to complaints of increasing shortness of breath.  He was noted to be hypoxic at the facility with a pulse ox in the 86% range on room air.  ED Course: Hypoxic on arrival 86% on room air placed on 2 L supplemental oxygen with improvement into the low 90s.  Patient symptoms improved after supplemental oxygen.  Temp 98.7, pulse 80, blood pressure 132/76, blood sugar 164, triglycerides 127, ferritin 49, CRP 1.2, procalcitonin less than 0.10, WBC 6.3, hemoglobin 9.2 D-dimer 1.39, fibrinogen 542, urinalysis with large leukocytes and nitrite positive and greater than 50 WBC per high-power field, SARS 2 coronavirus test positive.   Assessment & Plan: 1-acute respiratory failure with hypoxia ([place]) -In the setting of COVID-19 pneumonia -Continue IV steroids and IV remdesivir -Continue oxygen supplementation and wean it off as tolerated -Continue vitamin C and zinc -As needed bronchodilators will be provided -Patient encouraged to continue using incentive spirometer to exercise his lungs and improve respiratory effort. -Continue as needed bronchodilators -coursing day #3 on  infusion.  2-Chronic kidney disease, stage 3 -Appears to be stable and at baseline -Follow renal function trend -Advised to maintain adequate hydration.  3-Essential hypertension -Currently stable -Continue current antihypertensive regimen and slowly resolved rest of his medications prior to admission as needed.  4-Chronic hepatitis C without hepatic coma ([place]) -Continue outpatient follow-up with gastroenterology service -No signs of active infection appreciated LFTs within normal limits.  5-History of CVA (cerebrovascular accident) without residual deficits -No acute neurologic deficits -Continue secondary prevention -Continue statins, aspirin and Plavix.  6-UTI -Follow urine culture -Continue empiric Rocephin. -Patient reports increased frequency and mild dysuria.  7-gastroesophageal reflux disease/GI protection -Continue PPI  8-type 2 diabetes mellitus with nephropathy -Continue holding oral hypoglycemic agents -Continue sliding scale insulin and Levemir -Follow CBGs and adjust hypoglycemic regimen as required -With the use of a steroids hyperglycemia could be appreciated.  9-anemia in the setting of chronic kidney disease -Hemoglobin around 9 at baseline -No signs of overt bleed -Follow hemoglobin trend.  10-peripheral arterial disease and bilateral amputation -Continue aspirin and Plavix -Continue statins -Bilateral stumps are without signs of acute ulcers or infection. -Continue reflux notifications.  11-DNR -Patient with no issues to be a DO NOT RESUSCITATE prior to admission -Will respect and honor his wishes -Continue treatment for acute condition as mentioned above.  DVT prophylaxis: Lovenox Code Status: DNR Family Communication: No family at bedside. Disposition Plan: Remains inpatient, continue IV remdesivir, IV steroids, adjustment analgesics and supportive care.  As needed trazodone will be initiated to assist with insomnia.  Continue supportive care  and follow clinical response.  Continue to Wean off oxygen  supplementation as tolerated.  Consultants:   None  Procedures:   Below for x-ray reports  Antimicrobials:  Anti-infectives (From admission, onward)   Start     Dose/Rate Route Frequency Ordered Stop   02/21/19 1000  remdesivir 100 mg in sodium chloride 0.9 % 100 mL IVPB     100 mg 200 mL/hr over 30 Minutes Intravenous Daily 02/20/19 1330 02/25/19 0959   02/20/19 2130  cefTRIAXone (ROCEPHIN) 1 g in sodium chloride 0.9 % 100 mL IVPB     1 g 200 mL/hr over 30 Minutes Intravenous Every 24 hours 02/20/19 2120 02/23/19 2129   02/20/19 1400  remdesivir 200 mg in sodium chloride 0.9% 250 mL IVPB     200 mg 580 mL/hr over 30 Minutes Intravenous Once 02/20/19 1330 02/20/19 1759      Subjective: Reports sleeping better; no chest pain, no nausea, no vomiting.  Patient is afebrile still mildly short of breath and requiring 2 L nasal cannula supplementation.  Intermittent dry coughing spells reported.  Expressed that his pain is better controlled with adjusted analgesic regimen.  Objective: Vitals:   02/21/19 2121 02/22/19 0546 02/22/19 1353 02/22/19 1353  BP: (!) 164/75 (!) 150/73 (!) 145/80 (!) 145/80  Pulse: (!) 58 (!) 51 64 (!) 56  Resp: 20 20 20 20   Temp: 97.9 F (36.6 C) 97.6 F (36.4 C) 97.8 F (36.6 C) 97.8 F (36.6 C)  TempSrc: Oral Oral Oral Oral  SpO2: 98% 98% 99% 100%  Weight:      Height:        Intake/Output Summary (Last 24 hours) at 02/22/2019 1646 Last data filed at 02/22/2019 1632 Gross per 24 hour  Intake 1002.76 ml  Output 1250 ml  Net -247.24 ml   Filed Weights   02/20/19 1037 02/20/19 2036  Weight: 118 kg 119.5 kg    Examination: General exam: Alert, awake, oriented x 3; with good spirits and feeling better.  No chest pain, no nausea, no vomiting.  Still expressing mild shortness of breath and is requiring 2 L nasal cannula supplementation.  Positive intermittent nonproductive coughing spells  reported. Respiratory system: Positive rhonchi, no crackles, no wheezing, no using accessory muscle.   Cardiovascular system:RRR. No murmurs, rubs, gallops. Gastrointestinal system: Abdomen is nondistended, soft and nontender. No organomegaly or masses felt. Normal bowel sounds heard. Central nervous system: Alert and oriented. No focal neurological deficits. Extremities: No edema; bilateral BKA appreciated.  Stumps without open wounds. Skin: No rashes, lesions or ulcers Psychiatry: Judgement and insight appear normal. Mood & affect appropriate.     Data Reviewed: I have personally reviewed following labs and imaging studies  CBC: Recent Labs  Lab 02/20/19 1202 02/21/19 0618 02/22/19 0543  WBC 6.3 5.3 5.8  NEUTROABS 3.4 3.7 4.2  HGB 9.2* 8.6* 8.5*  HCT 30.4* 28.3* 28.3*  MCV 98.7 97.6 97.9  PLT 262 260 242   Basic Metabolic Panel: Recent Labs  Lab 02/20/19 1202 02/21/19 0618 02/22/19 0543  NA 137 136 136  K 4.4 4.8 4.8  CL 99 98 98  CO2 33* 29 30  GLUCOSE 164* 222* 223*  BUN 19 22 25*  CREATININE 1.22 1.10 1.15  CALCIUM 9.1 8.9 8.9  MG  --  2.1 2.3  PHOS  --  3.3 3.3   GFR: Estimated Creatinine Clearance: 84.4 mL/min (by C-G formula based on SCr of 1.15 mg/dL).   Liver Function Tests: Recent Labs  Lab 02/20/19 1202 02/21/19 0618 02/22/19 0543  AST 12* 13* 12*  ALT 13 12 14   ALKPHOS 52 47 46  BILITOT 0.5 0.4 0.2*  PROT 9.0* 9.0* 8.7*  ALBUMIN 3.3* 3.3* 3.3*   CBG: Recent Labs  Lab 02/21/19 2121 02/22/19 0318 02/22/19 0753 02/22/19 1116 02/22/19 1627  GLUCAP 174* 231* 177* 142* 88   Lipid Profile: Recent Labs    02/20/19 1202  TRIG 127   Anemia Panel: Recent Labs    02/21/19 0618 02/22/19 0543  FERRITIN 48 74   Urine analysis:    Component Value Date/Time   COLORURINE YELLOW 02/20/2019 1348   APPEARANCEUR HAZY (A) 02/20/2019 1348   LABSPEC 1.014 02/20/2019 1348   PHURINE 5.0 02/20/2019 1348   GLUCOSEU NEGATIVE 02/20/2019 1348    HGBUR NEGATIVE 02/20/2019 1348   [place] 02/20/2019 1348   KETONESUR NEGATIVE 02/20/2019 1348   PROTEINUR NEGATIVE 02/20/2019 1348   UROBILINOGEN 1.0 11/29/2013 1705   NITRITE POSITIVE (A) 02/20/2019 1348   LEUKOCYTESUR LARGE (A) 02/20/2019 1348    Recent Results (from the past 240 hour(s))  Blood Culture (routine x 2)     Status: None (Preliminary result)   Collection Time: 02/20/19 12:01 PM   Specimen: BLOOD LEFT HAND  Result Value Ref Range Status   Specimen Description   Final    BLOOD LEFT HAND BOTTLES DRAWN AEROBIC AND ANAEROBIC   Special Requests Blood Culture adequate volume  Final   Culture   Final    NO GROWTH 2 DAYS Performed at [facility], [address], [place], [place] [address]    Report Status PENDING  Incomplete  Blood Culture (routine x 2)     Status: None (Preliminary result)   Collection Time: 02/20/19 12:05 PM   Specimen: BLOOD RIGHT HAND  Result Value Ref Range Status   Specimen Description   Final    BLOOD RIGHT HAND BOTTLES DRAWN AEROBIC AND ANAEROBIC   Special Requests Blood Culture adequate volume  Final   Culture   Final    NO GROWTH 2 DAYS Performed at [facility], [address] [place], [place] [address]    Report Status PENDING  Incomplete  Urine culture     Status: Abnormal (Preliminary result)   Collection Time: 02/20/19  1:48 PM   Specimen: Urine, Clean Catch  Result Value Ref Range Status   Specimen Description   Final    URINE, CLEAN CATCH Performed at [facility], [address], [place], [place] [address]    Special Requests   Final    NONE Performed at [facility], [address], [place], [place] [address]    Culture (A)  Final    >=100,000 COLONIES/mL GRAM NEGATIVE RODS IDENTIFICATION AND SUSCEPTIBILITIES TO FOLLOW Performed at [facility], [place] [address], [place], [place] [address]    Report Status PENDING  Incomplete  MRSA PCR Screening     Status: None   Collection Time: 02/22/19  1:32 PM    Specimen: Nasal Mucosa; Nasopharyngeal  Result Value Ref Range Status   MRSA by PCR NEGATIVE NEGATIVE Final    Comment:        The GeneXpert MRSA Assay (FDA approved for NASAL specimens only), is one component of a comprehensive MRSA colonization surveillance program. It is not intended to diagnose MRSA infection nor to guide or monitor treatment for MRSA infections. Performed at [facility], [address] [place], [place] [address]     Radiology Studies: Portable chest 1 View  Result Date: 02/21/2019 CLINICAL DATA:  Shortness of breath. COVID-19 positive.  EXAM: PORTABLE CHEST 1 VIEW COMPARISON:  02/20/2019 FINDINGS: The cardiomediastinal silhouette is unchanged with upper limits of normal heart size. There is new subtle patchy opacity in the right mid lung. The lungs are otherwise clear. No pleural effusion or pneumothorax is identified. IMPRESSION: New mild right midlung opacity which may reflect early pneumonia. Electronically Signed   By: Logan Bores M.D.   On: 02/21/2019 07:59    Scheduled Meds: . vitamin C  500 mg Oral Daily  . aspirin EC  81 mg Oral Daily  . atorvastatin  10 mg Oral q1800  . carvedilol  3.125 mg Oral BID WC  . cholecalciferol  50 mcg Oral Daily  . clopidogrel  75 mg Oral Q breakfast  . dexamethasone  6 mg Oral Q24H  . enoxaparin (LOVENOX) injection  60 mg Subcutaneous Q24H  . gabapentin  600 mg Oral TID  . insulin aspart  0-20 Units Subcutaneous TID WC  . insulin aspart  0-5 Units Subcutaneous QHS  . insulin aspart  10 Units Subcutaneous TID WC  . insulin glargine  25 Units Subcutaneous QHS  . pantoprazole  40 mg Oral Daily  . polyethylene glycol  17 g Oral Daily  . senna-docusate  1 tablet Oral QHS  . tamsulosin  0.4 mg Oral Daily  . traZODone  50 mg Oral QHS  . cyanocobalamin  1,000 mcg Oral Daily  . zinc sulfate  220 mg Oral Daily   Continuous Infusions: . sodium chloride 50 mL/hr at 02/22/19 0300  . cefTRIAXone (ROCEPHIN)  IV Stopped  (02/21/19 2114)  . remdesivir 100 mg in NS 100 mL 100 mg (02/22/19 0942)     LOS: 2 days    Time spent: 30 minutes.   Barton Dubois, MD [facility] Pager [phone]   02/22/2019, 4:46 PM

## 2019-02-23 LAB — CBC WITH DIFFERENTIAL/PLATELET
Abs Immature Granulocytes: 0.04 10*3/uL (ref 0.00–0.07)
Basophils Absolute: 0 10*3/uL (ref 0.0–0.1)
Basophils Relative: 0 %
Eosinophils Absolute: 0 10*3/uL (ref 0.0–0.5)
Eosinophils Relative: 0 %
HCT: 29.2 % — ABNORMAL LOW (ref 39.0–52.0)
Hemoglobin: 8.7 g/dL — ABNORMAL LOW (ref 13.0–17.0)
Immature Granulocytes: 1 %
Lymphocytes Relative: 16 %
Lymphs Abs: 1 10*3/uL (ref 0.7–4.0)
MCH: 29.2 pg (ref 26.0–34.0)
MCHC: 29.8 g/dL — ABNORMAL LOW (ref 30.0–36.0)
MCV: 98 fL (ref 80.0–100.0)
Monocytes Absolute: 0.3 10*3/uL (ref 0.1–1.0)
Monocytes Relative: 4 %
Neutro Abs: 5.3 10*3/uL (ref 1.7–7.7)
Neutrophils Relative %: 79 %
Platelets: 258 10*3/uL (ref 150–400)
RBC: 2.98 MIL/uL — ABNORMAL LOW (ref 4.22–5.81)
RDW: 13.7 % (ref 11.5–15.5)
WBC: 6.6 10*3/uL (ref 4.0–10.5)
nRBC: 0 % (ref 0.0–0.2)

## 2019-02-23 LAB — COMPREHENSIVE METABOLIC PANEL
ALT: 32 U/L (ref 0–44)
AST: 24 U/L (ref 15–41)
Albumin: 3.2 g/dL — ABNORMAL LOW (ref 3.5–5.0)
Alkaline Phosphatase: 51 U/L (ref 38–126)
Anion gap: 5 (ref 5–15)
BUN: 27 mg/dL — ABNORMAL HIGH (ref 8–23)
CO2: 30 mmol/L (ref 22–32)
Calcium: 8.4 mg/dL — ABNORMAL LOW (ref 8.9–10.3)
Chloride: 98 mmol/L (ref 98–111)
Creatinine, Ser: 1.27 mg/dL — ABNORMAL HIGH (ref 0.61–1.24)
GFR calc Af Amer: >60 mL/min (ref >60)
GFR calc non Af Amer: 58 mL/min — ABNORMAL LOW (ref >60)
Glucose, Bld: 305 mg/dL — ABNORMAL HIGH (ref 70–99)
Potassium: 5 mmol/L (ref 3.5–5.1)
Sodium: 133 mmol/L — ABNORMAL LOW (ref 135–145)
Total Bilirubin: 0.2 mg/dL — ABNORMAL LOW (ref 0.3–1.2)
Total Protein: 8.4 g/dL — ABNORMAL HIGH (ref 6.5–8.1)

## 2019-02-23 LAB — GLUCOSE, CAPILLARY
Glucose-Capillary: 312 mg/dL — ABNORMAL HIGH (ref 70–99)
Glucose-Capillary: 242 mg/dL — ABNORMAL HIGH (ref 70–99)
Glucose-Capillary: 282 mg/dL — ABNORMAL HIGH (ref 70–99)
Glucose-Capillary: 186 mg/dL — ABNORMAL HIGH (ref 70–99)
Glucose-Capillary: 234 mg/dL — ABNORMAL HIGH (ref 70–99)

## 2019-02-23 LAB — PHOSPHORUS: Phosphorus: 3.5 mg/dL (ref 2.5–4.6)

## 2019-02-23 LAB — URINE CULTURE: Culture: >=100000 — AB

## 2019-02-23 LAB — C-REACTIVE PROTEIN: CRP: 0.6 mg/dL (ref <1.0)

## 2019-02-23 LAB — FERRITIN: Ferritin: 126 ng/mL (ref 24–336)

## 2019-02-23 LAB — D-DIMER, QUANTITATIVE: D-Dimer, Quant: 0.56 ug/mL-FEU — ABNORMAL HIGH (ref 0.00–0.50)

## 2019-02-23 LAB — MAGNESIUM: Magnesium: 2.3 mg/dL (ref 1.7–2.4)

## 2019-02-23 MED ORDER — SENNOSIDES-DOCUSATE SODIUM 8.6-50 MG PO TABS
1.0000 | ORAL_TABLET | Freq: Two times a day (BID) | ORAL | Status: DC
  Administered 2019-02-23 – 2019-02-25 (×4): 1 via ORAL
  Filled 2019-02-23 (×4): qty 1

## 2019-02-23 MED ORDER — CIPROFLOXACIN HCL 250 MG PO TABS
250.0000 mg | ORAL_TABLET | Freq: Two times a day (BID) | ORAL | Status: DC
  Administered 2019-02-23 – 2019-02-25 (×5): 250 mg via ORAL
  Filled 2019-02-23 (×5): qty 1

## 2019-02-23 MED ORDER — INSULIN GLARGINE 100 UNIT/ML ~~LOC~~ SOLN
30.0000 [IU] | Freq: Every day | SUBCUTANEOUS | Status: DC
  Administered 2019-02-23 – 2019-02-24 (×2): 30 [IU] via SUBCUTANEOUS
  Filled 2019-02-23 (×3): qty 0.3

## 2019-02-23 MED ORDER — CIPROFLOXACIN HCL 250 MG PO TABS: 250.0000 mg | ORAL_TABLET | Freq: Two times a day (BID) | ORAL | Status: DC

## 2019-02-23 MED ORDER — POLYETHYLENE GLYCOL 3350 17 G PO PACK
17.0000 g | PACK | Freq: Two times a day (BID) | ORAL | Status: DC
  Administered 2019-02-23 – 2019-02-25 (×4): 17 g via ORAL
  Filled 2019-02-23 (×4): qty 1

## 2019-02-23 NOTE — Progress Notes (Signed)
PROGRESS NOTE    Cory Weber  JKD:[id number] DOB: 1950-04-15 DOA: 02/20/2019 PCP: Iona Beard, MD     Brief Narrative:  As per H&P by Dr. Wynetta Emery on 02/20/19 69 y.o. male with a complex medical history significant for diastolic CHF, BPH, stage III CKD, type 2 diabetes mellitus, cerebrovascular disease status post CVA, chronic hepatitis C, history of substance abuse and alcohol, GERD who is a longtime resident of the [facility].  He reports that he was diagnosed with COVID-19 infection several days ago at the facility.  He reports that he has continued to have increasing symptoms of shortness of breath and fatigue and weakness.  He has had a persistent dry cough chills no fever and nasal congestion.  He denies having chest pain.  Orts urinary frequency.  He denies nausea vomiting.  He was sent to the emergency department due to complaints of increasing shortness of breath.  He was noted to be hypoxic at the facility with a pulse ox in the 86% range on room air.  ED Course: Hypoxic on arrival 86% on room air placed on 2 L supplemental oxygen with improvement into the low 90s.  Patient symptoms improved after supplemental oxygen.  Temp 98.7, pulse 80, blood pressure 132/76, blood sugar 164, triglycerides 127, ferritin 49, CRP 1.2, procalcitonin less than 0.10, WBC 6.3, hemoglobin 9.2 D-dimer 1.39, fibrinogen 542, urinalysis with large leukocytes and nitrite positive and greater than 50 WBC per high-power field, SARS 2 coronavirus test positive.   Assessment & Plan: 1-acute respiratory failure with hypoxia ([place]) -In the setting of COVID-19 pneumonia -Continue IV steroids and IV remdesivir -Continue oxygen supplementation and wean it off as tolerated -Continue vitamin C and zinc -As needed bronchodilators will be provided -Patient encouraged to continue using incentive spirometer to exercise his lungs and improve respiratory effort. -Continue as needed bronchodilators -coursing day #4 on  infusion.  2-Chronic kidney disease, stage 3 -Appears to be stable and at baseline -Follow renal function trend -Advised to maintain adequate hydration.  3-Essential hypertension -Currently stable -Continue current antihypertensive regimen and slowly resolved rest of his medications prior to admission as needed.  4-Chronic hepatitis C without hepatic coma ([place]) -Continue outpatient follow-up with gastroenterology service -No signs of active infection appreciated LFTs within normal limits.  5-History of CVA (cerebrovascular accident) without residual deficits -No acute neurologic deficits -Continue secondary prevention -Continue statins, aspirin and Plavix.  6-Enterobacter UTI -Following urine culture results antibiotics will be change to ciprofloxacin twice daily -Dose has been adjusted for his chronic renal failure -Patient is still having symptoms of frequency and incontinence -Will treat for 5 days. -No hematuria. -Microorganism resistant to ceftriaxone and cefazolin.  7-gastroesophageal reflux disease/GI protection -Continue PPI  8-type 2 diabetes mellitus with nephropathy -Continue holding oral hypoglycemic agents -Continue sliding scale insulin and Levemir -Follow CBGs and adjust hypoglycemic regimen as required -With the use of a steroids hyperglycemia could be appreciated.  9-anemia in the setting of chronic kidney disease -Hemoglobin around 9 at baseline -No signs of overt bleed -Follow hemoglobin trend.  10-peripheral arterial disease and bilateral amputation -Continue aspirin and Plavix -Continue statins -Bilateral stumps are without signs of acute ulcers or infection. -Continue reflux notifications.  11-DNR -Patient with no issues to be a DO NOT RESUSCITATE prior to admission -Will respect and honor his wishes -Continue treatment for acute condition as mentioned above.  DVT prophylaxis: Lovenox Code Status: DNR Family Communication: No family at  bedside. Disposition Plan: Remains inpatient, continue IV remdesivir, IV  steroids and based on urine culture results adjustment and change on antibiotics.  Continue supportive care.  As needed trazodone will be initiated to assist with insomnia.  Continue supportive care and follow clinical response.  Continue to Wean off oxygen supplementation as tolerated.  Consultants:   None  Procedures:   Below for x-ray reports  Antimicrobials:  Anti-infectives (From admission, onward)   Start     Dose/Rate Route Frequency Ordered Stop   02/23/19 1300  ciprofloxacin (CIPRO) tablet 250 mg  Status:  Discontinued     250 mg Oral 2 times daily 02/23/19 1224 02/23/19 1227   02/23/19 1300  ciprofloxacin (CIPRO) tablet 250 mg     250 mg Oral 2 times daily 02/23/19 1227 02/28/19 0759   02/21/19 1000  remdesivir 100 mg in sodium chloride 0.9 % 100 mL IVPB     100 mg 200 mL/hr over 30 Minutes Intravenous Daily 02/20/19 1330 02/25/19 0959   02/20/19 2130  cefTRIAXone (ROCEPHIN) 1 g in sodium chloride 0.9 % 100 mL IVPB     1 g 200 mL/hr over 30 Minutes Intravenous Every 24 hours 02/20/19 2120 02/22/19 2040   02/20/19 1400  remdesivir 200 mg in sodium chloride 0.9% 250 mL IVPB     200 mg 580 mL/hr over 30 Minutes Intravenous Once 02/20/19 1330 02/20/19 1759     Subjective: No fever, no chest pain, no nausea, no vomiting.  Reports breathing slowly improving.  Using 1.5 L nasal cannula supplementation.  Still having frequency and incontinence and complaining of constipation.  Objective: Vitals:   02/22/19 1353 02/22/19 2013 02/23/19 0619 02/23/19 1358  BP: (!) 145/80 (!) 144/69 (!) 170/87 (!) 151/66  Pulse: (!) 56 61 62 74  Resp: 20 18 20 16   Temp: 97.8 F (36.6 C) 98.2 F (36.8 C) 97.9 F (36.6 C) 98 F (36.7 C)  TempSrc: Oral Oral Oral Oral  SpO2: 100% 92% 98% 93%  Weight:      Height:        Intake/Output Summary (Last 24 hours) at 02/23/2019 1720 Last data filed at 02/23/2019 1500 Gross  per 24 hour  Intake 2241.88 ml  Output --  Net 2241.88 ml   Filed Weights   02/20/19 1037 02/20/19 2036  Weight: 118 kg 119.5 kg    Examination: General exam: Alert, awake, oriented x 3; reports feeling better, with improvement in his breathing and no having any chest pain.  Patient expressed still mild incontinence and frequency and constipation. Respiratory system: Positive rhonchi bilaterally; no using accessory muscles.  Fair air movement.  No crackles or wheezing.   Cardiovascular system:RRR. No murmurs, rubs, gallops. Gastrointestinal system: Abdomen is nondistended, soft and nontender. No organomegaly or masses felt. Normal bowel sounds heard. Central nervous system: Alert and oriented. No focal neurological deficits. Extremities: No no edema, no open wounds on his stumps.  Bilateral BKA appreciated. Skin: No rashes, no petechiae. Psychiatry: Judgement and insight appear normal. Mood & affect appropriate.    Data Reviewed: I have personally reviewed following labs and imaging studies  CBC: Recent Labs  Lab 02/20/19 1202 02/21/19 0618 02/22/19 0543 02/23/19 0456  WBC 6.3 5.3 5.8 6.6  NEUTROABS 3.4 3.7 4.2 5.3  HGB 9.2* 8.6* 8.5* 8.7*  HCT 30.4* 28.3* 28.3* 29.2*  MCV 98.7 97.6 97.9 98.0  PLT 262 260 255 259   Basic Metabolic Panel: Recent Labs  Lab 02/20/19 1202 02/21/19 0618 02/22/19 0543 02/23/19 0456  NA 137 136 136 133*  K 4.4 4.8  4.8 5.0  CL 99 98 98 98  CO2 33* 29 30 30   GLUCOSE 164* 222* 223* 305*  BUN 19 22 25* 27*  CREATININE 1.22 1.10 1.15 1.27*  CALCIUM 9.1 8.9 8.9 8.4*  MG  --  2.1 2.3 2.3  PHOS  --  3.3 3.3 3.5   GFR: Estimated Creatinine Clearance: 76.4 mL/min (A) (by C-G formula based on SCr of 1.27 mg/dL (H)).   Liver Function Tests: Recent Labs  Lab 02/20/19 1202 02/21/19 0618 02/22/19 0543 02/23/19 0456  AST 12* 13* 12* 24  ALT 13 12 14  32  ALKPHOS 52 47 46 51  BILITOT 0.5 0.4 0.2* 0.2*  PROT 9.0* 9.0* 8.7* 8.4*  ALBUMIN  3.3* 3.3* 3.3* 3.2*   CBG: Recent Labs  Lab 02/22/19 2012 02/23/19 0234 02/23/19 0803 02/23/19 1108 02/23/19 1600  GLUCAP 242* 312* 242* 282* 186*   Anemia Panel: Recent Labs    02/22/19 0543 02/23/19 0456  FERRITIN 74 126   Urine analysis:    Component Value Date/Time   COLORURINE YELLOW 02/20/2019 1348   APPEARANCEUR HAZY (A) 02/20/2019 1348   LABSPEC 1.014 02/20/2019 1348   PHURINE 5.0 02/20/2019 1348   GLUCOSEU NEGATIVE 02/20/2019 1348   HGBUR NEGATIVE 02/20/2019 [place] 02/20/2019 1348   KETONESUR NEGATIVE 02/20/2019 1348   PROTEINUR NEGATIVE 02/20/2019 1348   UROBILINOGEN 1.0 11/29/2013 1705   NITRITE POSITIVE (A) 02/20/2019 1348   LEUKOCYTESUR LARGE (A) 02/20/2019 1348    Recent Results (from the past 240 hour(s))  Blood Culture (routine x 2)     Status: None (Preliminary result)   Collection Time: 02/20/19 12:01 PM   Specimen: BLOOD LEFT HAND  Result Value Ref Range Status   Specimen Description   Final    BLOOD LEFT HAND BOTTLES DRAWN AEROBIC AND ANAEROBIC   Special Requests Blood Culture adequate volume  Final   Culture   Final    NO GROWTH 2 DAYS Performed at [facility], [address], [place], [place] [address]    Report Status PENDING  Incomplete  Blood Culture (routine x 2)     Status: None (Preliminary result)   Collection Time: 02/20/19 12:05 PM   Specimen: BLOOD RIGHT HAND  Result Value Ref Range Status   Specimen Description   Final    BLOOD RIGHT HAND BOTTLES DRAWN AEROBIC AND ANAEROBIC   Special Requests Blood Culture adequate volume  Final   Culture   Final    NO GROWTH 2 DAYS Performed at [facility], [address], [place], [place] [address]    Report Status PENDING  Incomplete  Urine culture     Status: Abnormal   Collection Time: 02/20/19  1:48 PM   Specimen: Urine, Clean Catch  Result Value Ref Range Status   Specimen Description   Final    URINE, CLEAN CATCH Performed at [facility], [address], [place], [place] [address]    Special Requests   Final    NONE Performed at [facility], [address] [place], [place] [address]    Culture >=100,000 COLONIES/mL ENTEROBACTER CLOACAE (A)  Final   Report Status 02/23/2019 FINAL  Final   Organism ID, Bacteria ENTEROBACTER CLOACAE (A)  Final      Susceptibility   Enterobacter cloacae - MIC*    CEFAZOLIN >=64 RESISTANT Resistant     CEFTRIAXONE 32 RESISTANT Resistant     CIPROFLOXACIN <=0.25 SENSITIVE Sensitive     GENTAMICIN <=1 SENSITIVE Sensitive  IMIPENEM 0.5 SENSITIVE Sensitive     NITROFURANTOIN 32 SENSITIVE Sensitive     TRIMETH/SULFA <=20 SENSITIVE Sensitive     PIP/TAZO <=4 SENSITIVE Sensitive     * >=100,000 COLONIES/mL ENTEROBACTER CLOACAE  MRSA PCR Screening     Status: None   Collection Time: 02/22/19  1:32 PM   Specimen: Nasal Mucosa; Nasopharyngeal  Result Value Ref Range Status   MRSA by PCR NEGATIVE NEGATIVE Final    Comment:        The GeneXpert MRSA Assay (FDA approved for NASAL specimens only), is one component of a comprehensive MRSA colonization surveillance program. It is not intended to diagnose MRSA infection nor to guide or monitor treatment for MRSA infections. Performed at [facility], [address], [place], [place] [address]     Radiology Studies: No results found.  Scheduled Meds: . vitamin C  500 mg Oral Daily  . aspirin EC  81 mg Oral Daily  . atorvastatin  10 mg Oral q1800  . carvedilol  3.125 mg Oral BID WC  . cholecalciferol  50 mcg Oral Daily  . ciprofloxacin  250 mg Oral BID  . clopidogrel  75 mg Oral Q breakfast  . dexamethasone  6 mg Oral Q24H  . enoxaparin (LOVENOX) injection  60 mg Subcutaneous Q24H  . gabapentin  600 mg Oral TID  . insulin aspart  0-20 Units Subcutaneous TID WC  . insulin aspart  0-5 Units Subcutaneous QHS  . insulin aspart  10 Units Subcutaneous TID WC  . insulin glargine  30 Units Subcutaneous QHS  . pantoprazole  40 mg Oral Daily  . polyethylene  glycol  17 g Oral BID  . senna-docusate  1 tablet Oral BID  . tamsulosin  0.4 mg Oral Daily  . traZODone  50 mg Oral QHS  . cyanocobalamin  1,000 mcg Oral Daily  . zinc sulfate  220 mg Oral Daily   Continuous Infusions: . sodium chloride 50 mL/hr at 02/23/19 1353  . remdesivir 100 mg in NS 100 mL 100 mg (02/23/19 0934)     LOS: 3 days    Time spent: 30 minutes.   Barton Dubois, MD [facility] Pager [phone]   02/23/2019, 5:20 PM

## 2019-02-23 NOTE — Progress Notes (Signed)
Inpatient Diabetes Program Recommendations  AACE/ADA: New Consensus Statement on Inpatient Glycemic Control (2015)  Target Ranges:  Prepandial:   less than 140 mg/dL      Peak postprandial:   less than 180 mg/dL (1-2 hours)      Critically ill patients:  140 - 180 mg/dL   Lab Results  Component Value Date   GLUCAP 242 (H) 02/23/2019   HGBA1C 6.8 (H) 02/20/2019    Review of Glycemic Control Results for MURTAZA, SHELL (MRN [id number]) as of 02/23/2019 09:04  Ref. Range 02/22/2019 03:18 02/22/2019 07:53 02/22/2019 11:16 02/22/2019 16:27 02/22/2019 20:12 02/23/2019 02:34 02/23/2019 08:03  Glucose-Capillary Latest Ref Range: 70 - 99 mg/dL 231 (H) 177 (H)  Novolog 14 units 142 (H)  Novolog 13 units 88     Decadron 6 mg  242 (H)  Novolog 2 units     Lantus 25 units 312 (H) 242 (H)  Novolog 17 units   Diabetes history: DM 2 Outpatient Diabetes medications: Lantus 24 units, Novolog 2-8 units tid + Novolog 5 units tid meal coverage, Metformin 500 mg bid Current orders for Inpatient glycemic control:  Lantus 25 units qhs Novolog 0-20 units tid + hs Novolog 10 units tid meal coverage   A1c 6.8% on 1/9 Decadron 6 mg Q24 hours BUN/Creat: 27/1.27 Supplements: None currently   Inpatient Diabetes Program Recommendations:    Consider increasing Lantus to 32 units  Based on trends yesterday consider decreasing Novolog meal coverage to 8 units.  Thanks,  Tama Headings RN, MSN, BC-ADM Inpatient Diabetes Coordinator Team Pager [phone] (8a-5p)

## 2019-02-24 DIAGNOSIS — U071 COVID-19: Secondary | ICD-10-CM | POA: Diagnosis present

## 2019-02-24 LAB — CBC WITH DIFFERENTIAL/PLATELET
Abs Immature Granulocytes: 0.07 10*3/uL (ref 0.00–0.07)
Basophils Absolute: 0 10*3/uL (ref 0.0–0.1)
Basophils Relative: 0 %
Eosinophils Absolute: 0 10*3/uL (ref 0.0–0.5)
Eosinophils Relative: 0 %
HCT: 29.8 % — ABNORMAL LOW (ref 39.0–52.0)
Hemoglobin: 8.9 g/dL — ABNORMAL LOW (ref 13.0–17.0)
Immature Granulocytes: 1 %
Lymphocytes Relative: 22 %
Lymphs Abs: 1.7 10*3/uL (ref 0.7–4.0)
MCH: 28.8 pg (ref 26.0–34.0)
MCHC: 29.9 g/dL — ABNORMAL LOW (ref 30.0–36.0)
MCV: 96.4 fL (ref 80.0–100.0)
Monocytes Absolute: 0.5 10*3/uL (ref 0.1–1.0)
Monocytes Relative: 6 %
Neutro Abs: 5.4 10*3/uL (ref 1.7–7.7)
Neutrophils Relative %: 71 %
Platelets: 262 10*3/uL (ref 150–400)
RBC: 3.09 MIL/uL — ABNORMAL LOW (ref 4.22–5.81)
RDW: 13.5 % (ref 11.5–15.5)
WBC: 7.6 10*3/uL (ref 4.0–10.5)
nRBC: 0 % (ref 0.0–0.2)

## 2019-02-24 LAB — COMPREHENSIVE METABOLIC PANEL
ALT: 33 U/L (ref 0–44)
AST: 20 U/L (ref 15–41)
Albumin: 3.1 g/dL — ABNORMAL LOW (ref 3.5–5.0)
Alkaline Phosphatase: 48 U/L (ref 38–126)
Anion gap: 6 (ref 5–15)
BUN: 24 mg/dL — ABNORMAL HIGH (ref 8–23)
CO2: 31 mmol/L (ref 22–32)
Calcium: 8.7 mg/dL — ABNORMAL LOW (ref 8.9–10.3)
Chloride: 98 mmol/L (ref 98–111)
Creatinine, Ser: 1.1 mg/dL (ref 0.61–1.24)
GFR calc Af Amer: >60 mL/min (ref >60)
GFR calc non Af Amer: >60 mL/min (ref >60)
Glucose, Bld: 221 mg/dL — ABNORMAL HIGH (ref 70–99)
Potassium: 4.2 mmol/L (ref 3.5–5.1)
Sodium: 135 mmol/L (ref 135–145)
Total Bilirubin: 0.4 mg/dL (ref 0.3–1.2)
Total Protein: 8.4 g/dL — ABNORMAL HIGH (ref 6.5–8.1)

## 2019-02-24 LAB — GLUCOSE, CAPILLARY
Glucose-Capillary: 245 mg/dL — ABNORMAL HIGH (ref 70–99)
Glucose-Capillary: 196 mg/dL — ABNORMAL HIGH (ref 70–99)
Glucose-Capillary: 204 mg/dL — ABNORMAL HIGH (ref 70–99)
Glucose-Capillary: 177 mg/dL — ABNORMAL HIGH (ref 70–99)
Glucose-Capillary: 184 mg/dL — ABNORMAL HIGH (ref 70–99)
Glucose-Capillary: 332 mg/dL — ABNORMAL HIGH (ref 70–99)

## 2019-02-24 LAB — D-DIMER, QUANTITATIVE: D-Dimer, Quant: 0.54 ug/mL-FEU — ABNORMAL HIGH (ref 0.00–0.50)

## 2019-02-24 LAB — C-REACTIVE PROTEIN: CRP: 0.7 mg/dL (ref <1.0)

## 2019-02-24 LAB — PHOSPHORUS: Phosphorus: 2.8 mg/dL (ref 2.5–4.6)

## 2019-02-24 LAB — FERRITIN: Ferritin: 105 ng/mL (ref 24–336)

## 2019-02-24 LAB — MAGNESIUM: Magnesium: 2.4 mg/dL (ref 1.7–2.4)

## 2019-02-24 IMAGING — DX DG CHEST 2V
2 series · 2 of 2 positions shown · non-contrast
Comparison: 11/25/2015

CLINICAL DATA: right foot pain. Right great and 2nd toes are black
and hard with no cap refill, rt lower leg pain. Patient complains of
pain x 1 week after catching his foot in a string in his blanket.
History of DM, HTN, CKD, PVD, CVA.

EXAM:
CHEST  2 VIEW

[chest lat]
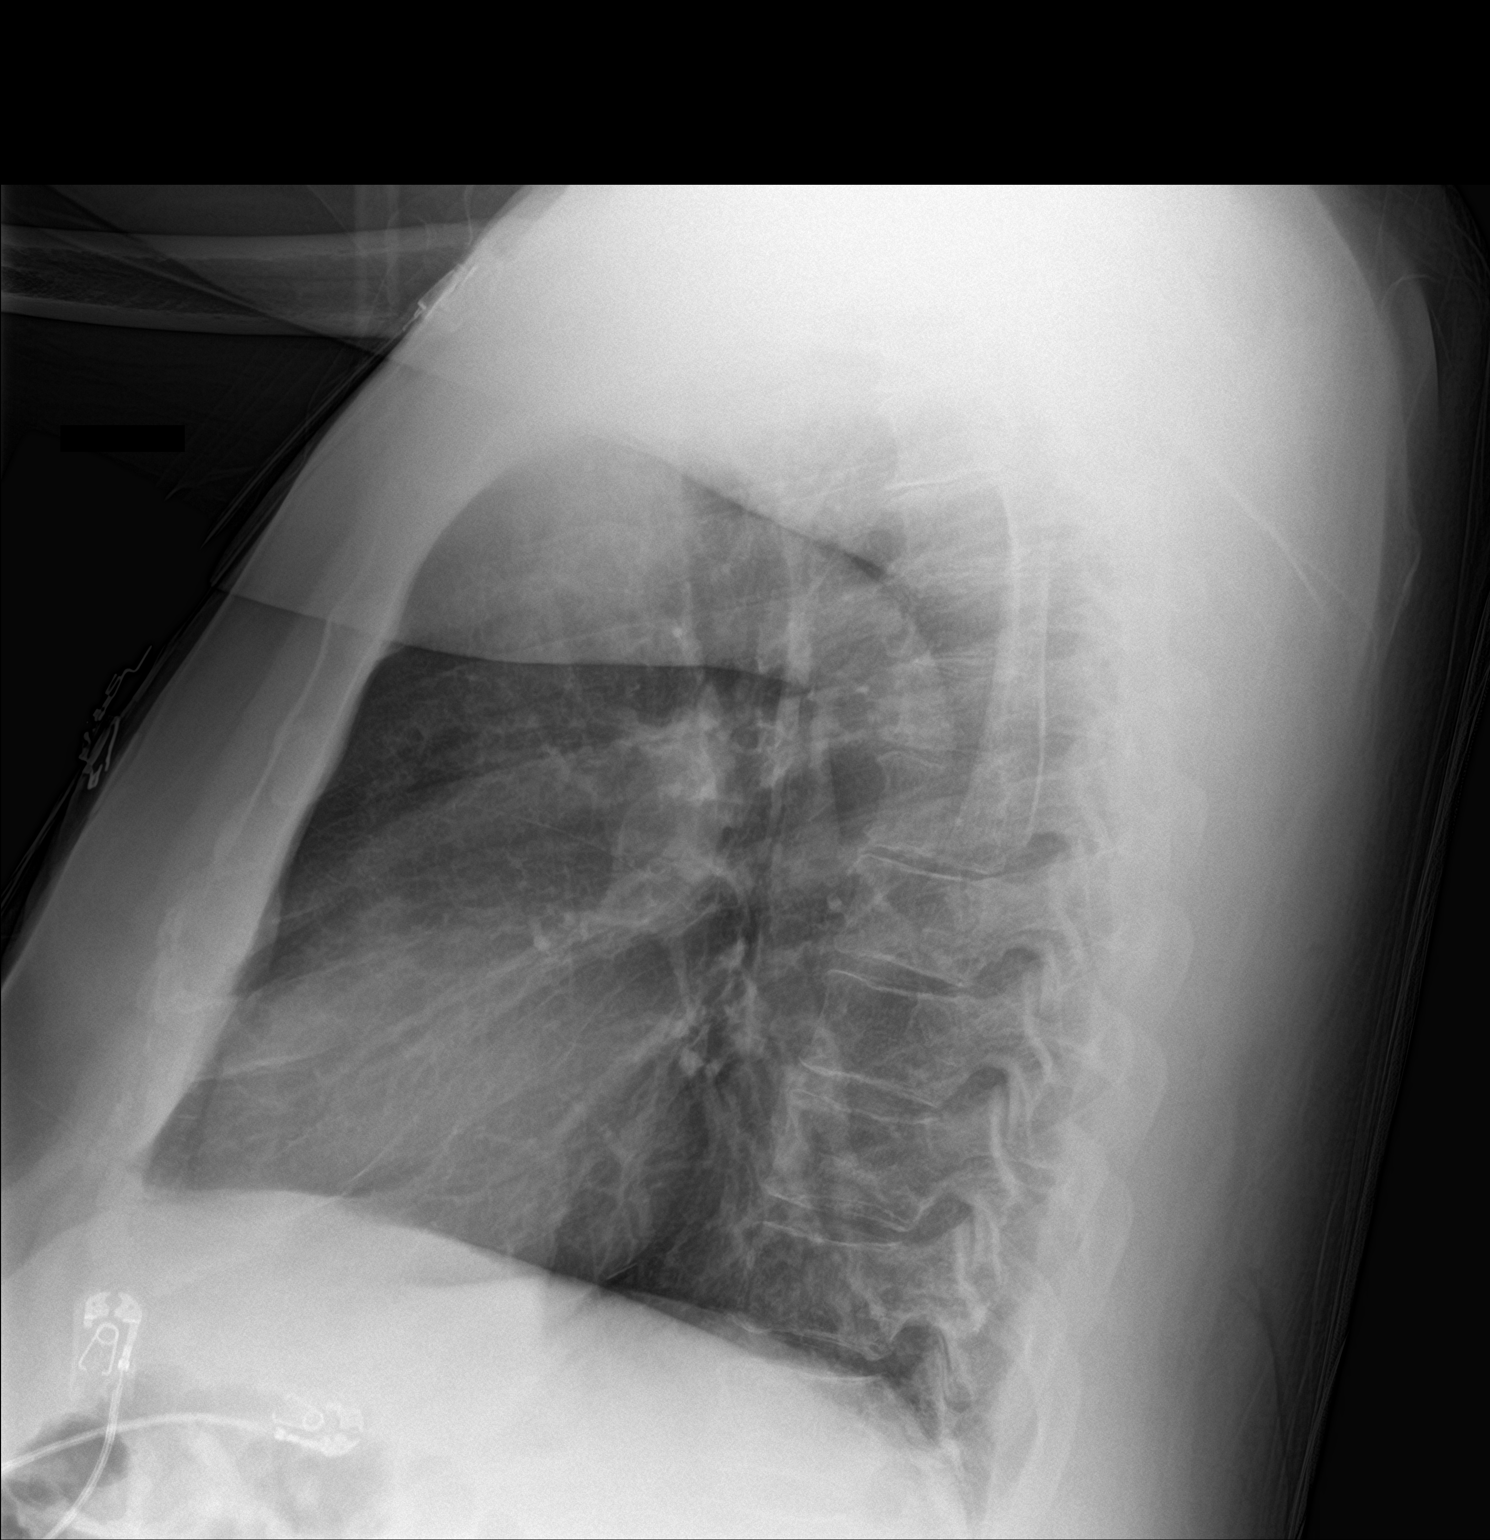

[chest ap]
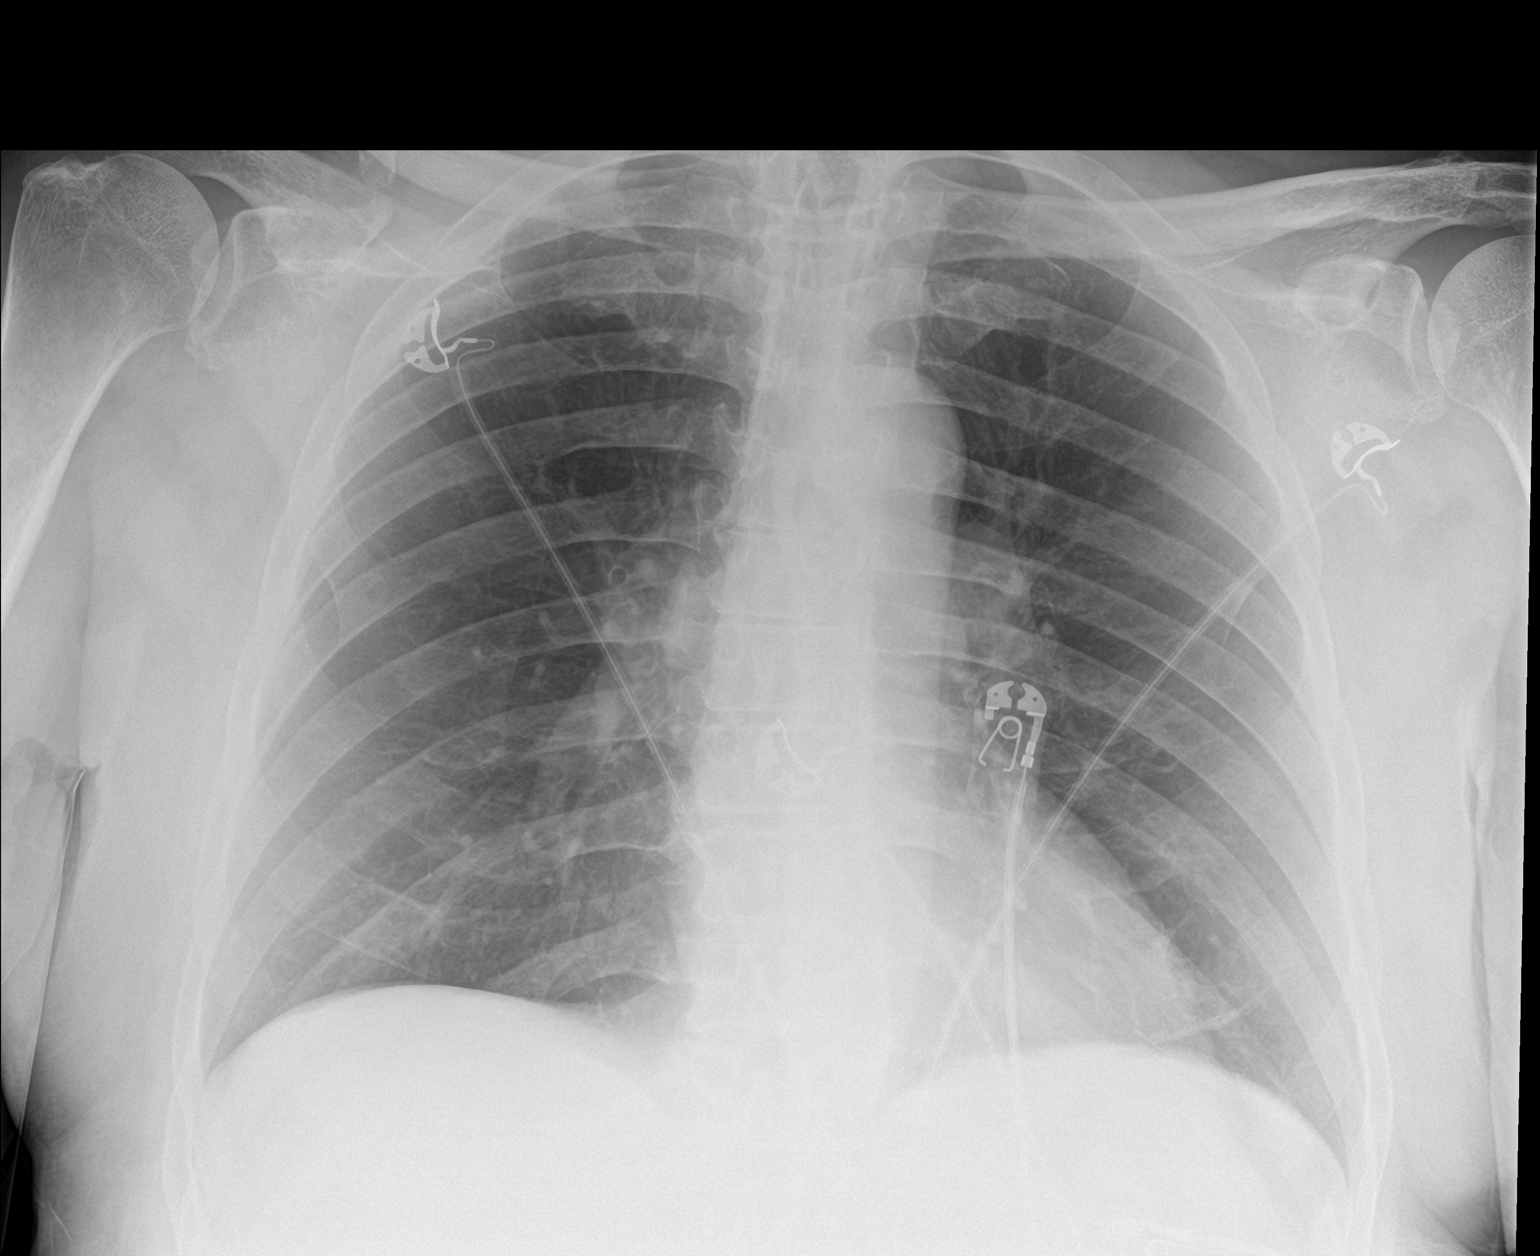

[2 of 2 positions shown; findings below may reference images not displayed]

FINDINGS: The heart size and mediastinal contours are within normal limits.
Both lungs are clear. No pleural effusion or pneumothorax. The
skeletal structures are intact.
IMPRESSION: No active cardiopulmonary disease.

## 2019-02-24 MED ORDER — OMEPRAZOLE 20 MG PO CPDR: 20.0000 mg | DELAYED_RELEASE_CAPSULE | Freq: Every day | ORAL | 1 refills | Status: DC

## 2019-02-24 MED ORDER — ACETAMINOPHEN 325 MG PO TABS: 650.0000 mg | ORAL_TABLET | Freq: Four times a day (QID) | ORAL | 0 refills | Status: DC | PRN

## 2019-02-24 MED ORDER — MAGNESIUM HYDROXIDE 400 MG/5ML PO SUSP
30.0000 mL | Freq: Every day | ORAL | Status: DC
  Administered 2019-02-24 – 2019-02-25 (×2): 30 mL via ORAL
  Filled 2019-02-24 (×2): qty 30

## 2019-02-24 MED ORDER — ASPIRIN EC 81 MG PO TBEC: 81.0000 mg | DELAYED_RELEASE_TABLET | Freq: Every day | ORAL | 2 refills | Status: AC

## 2019-02-24 MED ORDER — LISINOPRIL 10 MG PO TABS: 10.0000 mg | ORAL_TABLET | Freq: Every day | ORAL | 2 refills | Status: DC

## 2019-02-24 MED ORDER — CIPROFLOXACIN HCL 250 MG PO TABS: 250.0000 mg | ORAL_TABLET | Freq: Two times a day (BID) | ORAL | 0 refills | Status: AC

## 2019-02-24 MED ORDER — ZINC SULFATE 220 (50 ZN) MG PO CAPS: 220.0000 mg | ORAL_CAPSULE | Freq: Every day | ORAL | 2 refills | Status: DC

## 2019-02-24 MED ORDER — INSULIN GLARGINE 100 UNIT/ML ~~LOC~~ SOLN: 26.0000 [IU] | Freq: Every day | SUBCUTANEOUS | 11 refills | Status: DC

## 2019-02-24 NOTE — Progress Notes (Signed)
C/O no bm since Friday.  Receiving miralax and ducosate.  Stated that MOM usually is effective and received order for MOM daily.

## 2019-02-24 NOTE — Discharge Summary (Addendum)
Cory Weber, is a 69 y.o. male  DOB 1950/06/26  MRN [id number].  Admission date:  02/20/2019  Admitting Physician  Murlean Iba, MD  Discharge Date:  02/25/2019   Primary MD  Iona Beard, MD  Recommendations for primary care physician for things to follow:   Avoid ibuprofen/Advil/Aleve/Motrin/Goody Powders/Naproxen/BC powders/Meloxicam/Diclofenac/Indomethacin and other Nonsteroidal anti-inflammatory medications as these will make you more likely to bleed and can cause stomach ulcers, can also cause Kidney problems.   Continue strict isolation for up to 21 days for the date of initial diagnosis (starting 02/20/2019) .  Review of Systems: As per HPI otherwise 10 point review of systems negative.    Admission Diagnosis  Acute respiratory failure with hypoxia ([place]) [J96.01] COVID-19 [U07.1]   Discharge Diagnosis  Acute respiratory failure with hypoxia ([place]) [J96.01] COVID-19 [U07.1]    Principal Problem:   Pneumonia due to COVID-19 virus Active Problems:   COVID-19 virus detected   Polysubstance abuse ([place])   Tobacco abuse   S/P lumbar spinal fusion   Chronic kidney disease, stage 3   Essential hypertension   Chronic hepatitis C without hepatic coma (HCC)   History of CVA (cerebrovascular accident) without residual deficits   Anemia of chronic disease   BPH (benign prostatic hyperplasia)   GERD (gastroesophageal reflux disease)   Dyslipidemia associated with type 2 diabetes mellitus (HCC)   Chronic gout due to renal impairment involving toe of right foot without tophus   Physical deconditioning   Chronic diastolic CHF (congestive heart failure) (HCC)   S/P transmetatarsal amputation of foot, right (HCC)   PAD (peripheral artery disease) (HCC)   Acute respiratory failure with hypoxia (HCC)   UTI (urinary tract infection)   DNR (do not resuscitate)      Past Medical History:   Diagnosis Date  . Anemia   . Asthma   . BPH (benign prostatic hyperplasia)   . Cellulitis and abscess of foot 07/2015  . Chronic diastolic heart failure ([place])    per Nursing facility  . Chronic kidney disease    CKD stage 3  . CVA (cerebral vascular accident) ([place]) 2015   denies residual on 08/15/2015  . GERD (gastroesophageal reflux disease)   . Hepatitis C    states he was diagnosed in 2007 or 2007 while living in [place], [place]  . Hepatitis C   . Hyperlipidemia   . Hypertension   . Ischemia of foot 05/31/2016  . Osteomyelitis ([place])    per Nursing Facility  . Peripheral neuropathy   . Pneumonia X 1  . PVD (peripheral vascular disease) ([place])   . Type II diabetes mellitus ([place])   . Wet gangrene ([place]) 06/10/2016    Past Surgical History:  Procedure Laterality Date  . ABDOMINAL AORTOGRAM N/A 06/03/2016   Procedure: Abdominal Aortogram;  Surgeon: Waynetta Sandy, MD;  Location: [facility];  Service: Cardiovascular;  Laterality: N/A;  . AMPUTATION Left 08/16/2015   Procedure: LEFT BELOW THE KNEE AMPUTATION ;  Surgeon: Serafina Mitchell, MD;  Location: [facility];  Service: Vascular;  Laterality: Left;  . AMPUTATION Right 08/22/2016   Procedure: AMPUTATION BELOW KNEE-RIGHT;  Surgeon: Waynetta Sandy, MD;  Location: [facility];  Service: Vascular;  Laterality: Right;  . BACK SURGERY    . BELOW KNEE LEG AMPUTATION Left 08/16/2015  . BIOPSY N/A 09/03/2012   Procedure: BIOPSY;  Surgeon: Daneil Dolin, MD;  [facility];  Service: Endoscopy;  Laterality: N/A;  gastric and gastric mucosa  . BIOPSY N/A 12/03/2012   Procedure: BIOPSY;  Surgeon: Daneil Dolin, MD;  [facility];  Service: Endoscopy;  Laterality: N/A;  . COLONOSCOPY WITH PROPOFOL N/A 09/03/2012   DJT:TSVXBLT polyp-removed as outlined above. Prominent internal hemorrhoids. Tubular adenoma  . ESOPHAGOGASTRODUODENOSCOPY (EGD) WITH PROPOFOL N/A 09/03/2012   JQZ:ESPQZR hernia. Gastric diverticulum. Gastric  ulcers with associated erosions. Duodenal erosions. Status post gastric biopsy. H.PYLORI gastritis   . ESOPHAGOGASTRODUODENOSCOPY (EGD) WITH PROPOFOL N/A 12/03/2012   Dr. Gala Romney: gastric diverticulum, gastric erosions and scar. Previously noted gastric ulcer completed healed. Biopsy without H.pylori.   . ESOPHAGOGASTRODUODENOSCOPY (EGD) WITH PROPOFOL N/A 01/23/2016   Procedure: ESOPHAGOGASTRODUODENOSCOPY (EGD) WITH PROPOFOL;  Surgeon: Doran Stabler, MD;  [facility];  Service: Gastroenterology;  Laterality: N/A;  . I & D EXTREMITY Right 07/11/2016   Procedure: DELAY PRIMARY CLOSURE FOOT AMPUTATION;  Surgeon: Edrick Kins, DPM;  Location: [facility];  Service: Podiatry;  Laterality: Right;  . LIVER BIOPSY  2005   Done in [place], [place]. Chronic hepatitis with mild periportal inflammation, lobular unicellular necrosis and portal fibrosis. Grade 2, stage 1-2.  . LOWER EXTREMITY ANGIOGRAPHY Right 06/03/2016   Procedure: Lower Extremity Angiography;  Surgeon: Waynetta Sandy, MD;  Location: [facility];  Service: Cardiovascular;  Laterality: Right;  . MAXIMUM ACCESS (MAS)POSTERIOR LUMBAR INTERBODY FUSION (PLIF) 1 LEVEL N/A 11/25/2013   Procedure: FOR MAXIMUM ACCESS (MAS) POSTERIOR LUMBAR INTERBODY FUSION (PLIF) 1 LEVEL;  Surgeon: Eustace Moore, MD;  Location: [facility];  Service: Neurosurgery;  Laterality: N/A;  FOR MAXIMUM ACCESS (MAS) POSTERIOR LUMBAR INTERBODY FUSION (PLIF) 1 LEVEL LUMBAR 3-4  . PERIPHERAL VASCULAR ATHERECTOMY Right 06/03/2016   Procedure: Peripheral Vascular Atherectomy;  Surgeon: Waynetta Sandy, MD;  Location: [facility];  Service: Cardiovascular;  Laterality: Right;  SFA WITH STENT  . PERIPHERAL VASCULAR CATHETERIZATION Left 08/01/2015   Procedure: Lower Extremity Angiography;  Surgeon: Serafina Mitchell, MD;  Location: [facility];  Service: Cardiovascular;  Laterality: Left;  . PERIPHERAL VASCULAR CATHETERIZATION N/A 08/01/2015    Procedure: Abdominal Aortogram;  Surgeon: Serafina Mitchell, MD;  Location: [facility];  Service: Cardiovascular;  Laterality: N/A;  . PERIPHERAL VASCULAR CATHETERIZATION N/A 08/08/2015   Procedure: Abdominal Aortogram w/Lower Extremity;  Surgeon: Serafina Mitchell, MD;  Location: [facility];  Service: Cardiovascular;  Laterality: N/A;  . PERIPHERAL VASCULAR CATHETERIZATION Left 08/08/2015   Procedure: Peripheral Vascular Balloon Angioplasty;  Surgeon: Serafina Mitchell, MD;  Location: [facility];  Service: Cardiovascular;  Laterality: Left;  left popiteal artery, left peronealtrunk, left post tibial  . POLYPECTOMY N/A 09/03/2012   Procedure: POLYPECTOMY;  Surgeon: Daneil Dolin, MD;  [facility];  Service: Endoscopy;  Laterality: N/A;  cecal polyp  . TONSILLECTOMY    . TRANSMETATARSAL AMPUTATION Right 06/13/2016   Procedure: RIGHT TRANSMETATARSAL AMPUTATION;  Surgeon: Waynetta Sandy, MD;  Location: [facility];  Service: Vascular;  Laterality: Right;  . TRANSMETATARSAL AMPUTATION Right 07/10/2016  Procedure: REVISION RIGHT TRANSMETATARSAL AMPUTATION;  Surgeon: Waynetta Sandy, MD;  Location: [facility];  Service: Vascular;  Laterality: Right;       HPI  from the history and physical done on the day of admission:    Chief Complaint: Shortness of breath   HPI: Cory Weber is a 69 y.o. male with a complex medical history significant for diastolic CHF, BPH, stage III CKD, type 2 diabetes mellitus, cerebrovascular disease status post CVA, chronic hepatitis C, history of substance abuse and alcohol, GERD who is a longtime resident of the [facility].  He reports that he was diagnosed with COVID-19 infection several days ago at the facility.  He reports that he has continued to have increasing symptoms of shortness of breath and fatigue and weakness.  He has had a persistent dry cough chills no fever and nasal congestion.  He denies having chest pain.  Orts urinary  frequency.  He denies nausea vomiting.  He was sent to the emergency department due to complaints of increasing shortness of breath.  He was noted to be hypoxic at the facility with a pulse ox in the 86% range on room air.  ED Course: Hypoxic on arrival 86% on room air placed on 2 L supplemental oxygen with improvement into the low 90s.  Patient symptoms improved after supplemental oxygen.  Temp 98.7, pulse 80, blood pressure 132/76, blood sugar 164, triglycerides 127, ferritin 49, CRP 1.2, procalcitonin less than 0.10, WBC 6.3, hemoglobin 9.2 D-dimer 1.39, fibrinogen 542, urinalysis with large leukocytes and nitrite positive and greater than 50 WBC per high-power field, SARS 2 coronavirus test positive    Hospital Course:    Brief Narrative:  As per H&P by Dr. Wynetta Emery on 02/20/19 68 y.o.malewitha complexmedical history significantfor diastolic CHF, BPH, stage III CKD, type 2 diabetes mellitus, cerebrovascular disease status post CVA, chronic hepatitis C, history of substance abuse and alcohol, GERD who is a longtime resident of the [facility]. He reports that he was diagnosed with COVID-19 infection several days ago at the facility. He reports that he has continued to have increasing symptoms of shortness of breath and fatigue and weakness. He has had a persistent dry cough chills no fever and nasal congestion. He denies having chest pain. Orts urinary frequency. He denies nausea vomiting. He was sent to the emergency department due to complaints of increasing shortness of breath. He was noted to be hypoxic at the facility with a pulse ox in the 86% range on room air.  ED Course:Hypoxic on arrival 86% on room air placed on 2 L supplemental oxygen with improvement into the low 90s. Patient symptoms improved after supplemental oxygen. Temp 98.7, pulse 80, blood pressure 132/76, blood sugar 164, triglycerides 127, ferritin 49, CRP 1.2, procalcitonin less than 0.10, WBC 6.3, hemoglobin 9.2  D-dimer 1.39, fibrinogen 542, urinalysis with large leukocytes and nitrite positive and greater than 50 WBC per high-power field, SARS 2 coronavirus test positive.   Assessment & Plan:  1-acute respiratory failure with hypoxia ([place]) -In the setting of COVID-19 pneumonia -Patient completed IV steroids and IV remdesivir -Hypoxia resolved  -Treated with vitamin C and zinc -Treated with bronchodilators -Overall much improved  2-suspicion of chronic kidney disease, stage 3 -Renal function currently pretty good with a creatinine of 1.1, which appears to be baseline -In retrospect no evidence for CKD 3 at this time (previously mentioned diagnosis of CKD stage III currently not confirmed)  3-Essential hypertension -Stable, continue amlodipine 5 mg daily, Coreg  3.125 mg twice daily, reduce lisinopril to 10 mg daily  4-Chronic hepatitis C without hepatic coma ([place]) -Continue outpatient follow-up with gastroenterology service - LFTs within normal limits.  5-History of CVA (cerebrovascular accident) without residual deficits -No acute neurologic deficits -Continue secondary prevention -Continue simvastatin, aspirin and Plavix.  6-Enterobacter UTI -Culture results noted, clinically much improved, okay to complete p.o. Cipro as ordered   7)Gastroesophageal reflux disease/GI protection -Omeprazole/PPI recommended  8-type 2 diabetes mellitus with nephropathy --A1c 6.8 on 02/20/2019, -Discharged on Lantus insulin 26 units daily along with sliding scale coverage  9-anemia in the setting of chronic kidney disease -Hemoglobin around 9 at baseline -No signs of overt bleed  10-peripheral arterial disease and bilateral amputation -Continue simvastatin, aspirin and Plavix -Bilateral stumps are without signs of acute ulcers or infection.   11-DNR -Patient with no issues to be a DO NOT RESUSCITATE prior to admission -Will respect and honor his wishes   DVT prophylaxis: Lovenox  Code Status: DNR Family Communication: No family at bedside. Disposition--patient has completed treatment for COVID-19 respiratory infection with remdesivir and steroids, hypoxia has resolved, okay to discharge back to [facility] Discharge Condition: Stable Follow UP--PCP as advised   Diet and Activity recommendation:  As advised  Discharge Instructions    Discharge Instructions    Call MD for:  difficulty breathing, headache or visual disturbances   Complete by: As directed    Call MD for:  persistant dizziness or light-headedness   Complete by: As directed    Call MD for:  persistant nausea and vomiting   Complete by: As directed    Call MD for:  severe uncontrolled pain   Complete by: As directed    Call MD for:  temperature >100.4   Complete by: As directed    Diet - low sodium heart healthy   Complete by: As directed    Diet Carb Modified   Complete by: As directed    Discharge instructions   Complete by: As directed    Avoid ibuprofen/Advil/Aleve/Motrin/Goody Powders/Naproxen/BC powders/Meloxicam/Diclofenac/Indomethacin and other Nonsteroidal anti-inflammatory medications as these will make you more likely to bleed and can cause stomach ulcers, can also cause Kidney problems.   Increase activity slowly   Complete by: As directed        Discharge Medications     Allergies as of 02/25/2019      Reactions   No Known Allergies       Medication List    TAKE these medications   acetaminophen 325 MG tablet Commonly known as: TYLENOL Take 2 tablets (650 mg total) by mouth every 6 (six) hours as needed for mild pain or headache (fever >/= 101).   amLODipine 5 MG tablet Commonly known as: NORVASC Take 5 mg by mouth daily.   aspirin EC 81 MG tablet Take 1 tablet (81 mg total) by mouth daily with breakfast. What changed: when to take this   baclofen 10 MG tablet Commonly known as: LIORESAL Take 1 tablet (10 mg total) by mouth 3 (three) times daily as needed for  muscle spasms. What changed:   how much to take  how to take this  when to take this  reasons to take this  Another medication with the same name was removed. Continue taking this medication, and follow the directions you see here.   carvedilol 3.125 MG tablet Commonly known as: COREG Take 1 tablet (3.125 mg total) by mouth 2 (two) times daily with a meal.   ciprofloxacin 250 MG  tablet Commonly known as: CIPRO Take 1 tablet (250 mg total) by mouth 2 (two) times daily for 3 days.   clopidogrel 75 MG tablet Commonly known as: PLAVIX Take 1 tablet (75 mg total) by mouth daily with breakfast.   cyanocobalamin 1000 MCG tablet Take 1,000 mcg by mouth daily.   gabapentin 600 MG tablet Commonly known as: NEURONTIN Take 1 tablet (600 mg total) by mouth 3 (three) times daily.   insulin aspart 100 UNIT/ML injection Commonly known as: novoLOG Inject 5 Units into the skin 3 (three) times daily after meals. What changed:   how much to take  when to take this  additional instructions   insulin glargine 100 UNIT/ML injection Commonly known as: LANTUS Inject 0.26 mLs (26 Units total) into the skin at bedtime. What changed: how much to take   lisinopril 10 MG tablet Commonly known as: ZESTRIL Take 1 tablet (10 mg total) by mouth daily. What changed:   medication strength  how much to take   Melatonin 5 MG Tabs Take 1 tablet by mouth at bedtime.   metFORMIN 500 MG tablet Commonly known as: GLUCOPHAGE Take 500 mg by mouth 2 (two) times daily with a meal.   omeprazole 20 MG capsule Commonly known as: PriLOSEC Take 1 capsule (20 mg total) by mouth daily.   polyethylene glycol 17 g packet Commonly known as: MIRALAX / GLYCOLAX Take 17 g by mouth daily.   senna-docusate 8.6-50 MG tablet Commonly known as: Senokot-S Take 2 tablets by mouth at bedtime. What changed:   how much to take  when to take this   simvastatin 20 MG tablet Commonly known as: ZOCOR Take  20 mg by mouth daily.   tamsulosin 0.4 MG Caps capsule Commonly known as: FLOMAX Take 1 capsule (0.4 mg total) by mouth daily.   torsemide 20 MG tablet Commonly known as: DEMADEX Take 2 tablets (40 mg total) by mouth daily.   vitamin C 500 MG tablet Commonly known as: ASCORBIC ACID Take 500 mg by mouth 2 (two) times daily.   Vitamin D3 50 MCG (2000 UT) Tabs Take 1 tablet by mouth daily.   zinc sulfate 220 (50 Zn) MG capsule Take 1 capsule (220 mg total) by mouth daily.       Major procedures and Radiology Reports - PLEASE review detailed and final reports for all details, in brief -   Portable chest 1 View  Result Date: 02/21/2019 CLINICAL DATA:  Shortness of breath. COVID-19 positive. EXAM: PORTABLE CHEST 1 VIEW COMPARISON:  02/20/2019 FINDINGS: The cardiomediastinal silhouette is unchanged with upper limits of normal heart size. There is new subtle patchy opacity in the right mid lung. The lungs are otherwise clear. No pleural effusion or pneumothorax is identified. IMPRESSION: New mild right midlung opacity which may reflect early pneumonia. Electronically Signed   By: Logan Bores M.D.   On: 02/21/2019 07:59   DG Chest Port 1 View  Result Date: 02/20/2019 CLINICAL DATA:  Shortness of breath.  COVID-19 positive EXAM: PORTABLE CHEST 1 VIEW COMPARISON:  August 24, 2016 FINDINGS: The lungs are clear. Heart is upper normal in size with pulmonary vascularity normal. No adenopathy. No bone lesions. A benign exostosis is noted along the inferior aspect of the distal right clavicle, stable. IMPRESSION: Lungs clear.  Stable cardiac silhouette.  No adenopathy. Electronically Signed   By: Lowella Grip III M.D.   On: 02/20/2019 11:40    Micro Results    Recent Results (from the past 240  hour(s))  Blood Culture (routine x 2)     Status: None   Collection Time: 02/20/19 12:01 PM   Specimen: BLOOD LEFT HAND  Result Value Ref Range Status   Specimen Description   Final    BLOOD LEFT  HAND BOTTLES DRAWN AEROBIC AND ANAEROBIC   Special Requests Blood Culture adequate volume  Final   Culture   Final    NO GROWTH 5 DAYS Performed at [facility], [address] [place], [place] [address]    Report Status 02/25/2019 FINAL  Final  Blood Culture (routine x 2)     Status: None   Collection Time: 02/20/19 12:05 PM   Specimen: BLOOD RIGHT HAND  Result Value Ref Range Status   Specimen Description   Final    BLOOD RIGHT HAND BOTTLES DRAWN AEROBIC AND ANAEROBIC   Special Requests Blood Culture adequate volume  Final   Culture   Final    NO GROWTH 5 DAYS Performed at [facility], [address], [place], [place] [address]    Report Status 02/25/2019 FINAL  Final  Urine culture     Status: Abnormal   Collection Time: 02/20/19  1:48 PM   Specimen: Urine, Clean Catch  Result Value Ref Range Status   Specimen Description   Final    URINE, CLEAN CATCH Performed at [facility], [address], [place], [place] [address]    Special Requests   Final    NONE Performed at [facility], [address] [place], [place] [address]    Culture >=100,000 COLONIES/mL ENTEROBACTER CLOACAE (A)  Final   Report Status 02/23/2019 FINAL  Final   Organism ID, Bacteria ENTEROBACTER CLOACAE (A)  Final      Susceptibility   Enterobacter cloacae - MIC*    CEFAZOLIN >=64 RESISTANT Resistant     CEFTRIAXONE 32 RESISTANT Resistant     CIPROFLOXACIN <=0.25 SENSITIVE Sensitive     GENTAMICIN <=1 SENSITIVE Sensitive     IMIPENEM 0.5 SENSITIVE Sensitive     NITROFURANTOIN 32 SENSITIVE Sensitive     TRIMETH/SULFA <=20 SENSITIVE Sensitive     PIP/TAZO <=4 SENSITIVE Sensitive     * >=100,000 COLONIES/mL ENTEROBACTER CLOACAE  MRSA PCR Screening     Status: None   Collection Time: 02/22/19  1:32 PM   Specimen: Nasal Mucosa; Nasopharyngeal  Result Value Ref Range Status   MRSA by PCR NEGATIVE NEGATIVE Final    Comment:        The GeneXpert MRSA Assay (FDA approved for NASAL specimens only), is one  component of a comprehensive MRSA colonization surveillance program. It is not intended to diagnose MRSA infection nor to guide or monitor treatment for MRSA infections. Performed at [facility], [address], [place], [place] [address]        Today   Subjective    Cory Weber today has no new complaints,  -Eating and drinking well, no significant shortness of breath, no chest pains or palpitations no fevers no chills no vomiting no diarrhea completed remdesivir and steroids          Patient has been seen and examined prior to discharge   Objective   Blood pressure (!) 148/74, pulse 72, temperature 97.9 F (36.6 C), temperature source Oral, resp. rate 18, height 6' (1.829 m), weight 119.5 kg, SpO2 95 %.   Intake/Output Summary (Last 24 hours) at 02/25/2019 1433 Last data filed at 02/25/2019 1300 Gross per 24 hour  Intake 1320 ml  Output 3250 ml  Net -1930 ml   Exam Gen:- Awake Alert, no acute distress  HEENT:- [place].AT, No sclera icterus Neck-Supple Neck,No JVD,.  Lungs-  CTAB , good air movement bilaterally  CV- S1, S2 normal, regular Abd-  +ve B.Sounds, Abd Soft, No tenderness,    Extremity/Skin:- No  edema,   good pulses Psych-affect is appropriate, oriented x3 Neuro-no new focal deficits, no tremors  MSK-bilateral BKA   Data Review   CBC w Diff:  Lab Results  Component Value Date   WBC 8.4 02/25/2019   HGB 9.2 (L) 02/25/2019   HCT 30.2 (L) 02/25/2019   HCT 26.3 (L) 11/26/2015   HCT 32 10/31/2011   PLT 264 02/25/2019   LYMPHOPCT 21 02/25/2019   MONOPCT 7 02/25/2019   EOSPCT 0 02/25/2019   BASOPCT 0 02/25/2019    CMP:  Lab Results  Component Value Date   NA 134 (L) 02/25/2019   NA 134 (A) 12/27/2015   K 4.4 02/25/2019   K 4.0 10/31/2011   CL 98 02/25/2019   CO2 30 02/25/2019   BUN 26 (H) 02/25/2019   BUN 16 12/27/2015   CREATININE 1.17 02/25/2019   CREATININE 1.06 12/22/2015   GLU 520 12/27/2015   PROT 8.7 (H) 02/25/2019   ALBUMIN  3.2 (L) 02/25/2019   ALBUMIN 4.0 10/31/2011   BILITOT 0.3 02/25/2019   BILITOT 0.3 10/31/2011   ALKPHOS 47 02/25/2019   ALKPHOS 51 10/31/2011   AST 22 02/25/2019   AST 26 10/31/2011   ALT 38 02/25/2019  .   Total Discharge time is about 33 minutes  Roxan Hockey M.D on 02/25/2019 at 2:33 PM  Go to [email or web address] -  for contact info  [facility] - Office  [phone]

## 2019-02-24 NOTE — NC FL2 (Deleted)
[place] LEVEL OF CARE SCREENING TOOL     IDENTIFICATION  Patient Name: Cory Weber Birthdate: 09-06-1950 Sex: male Admission Date (Current Location): 02/20/2019  [facility] and [place] Number:  [employer] and Address:  [place] [address] [place]      Provider Number: [id number]  Attending Physician Name and Address:  Roxan Hockey, MD  Relative Name and Phone Number:       Current Level of Care: Hospital Recommended Level of Care: [place] Prior Approval Number:    Date Approved/Denied:   PASRR Number:    Discharge Plan: SNF    Current Diagnoses: Patient Active Problem List   Diagnosis Date Noted  . Pneumonia due to COVID-19 virus 02/24/2019  . Acute respiratory failure with hypoxia ([place]) 02/20/2019  . COVID-19 virus detected 02/20/2019  . UTI (urinary tract infection) 02/20/2019  . DNR (do not resuscitate) 02/20/2019  . PAD (peripheral artery disease) ([place]) 08/22/2016  . Osteomyelitis of foot ([place]) 07/08/2016  . S/P transmetatarsal amputation of foot, right ([place]) 06/26/2016  . Constipation due to opioid therapy 06/15/2016  . Physical deconditioning 06/10/2016  . Wet gangrene ([place]) 06/10/2016  . Chronic diastolic CHF (congestive heart failure) ([place]) 06/10/2016  . Ischemic pain of right foot 06/04/2016  . Ischemia of foot 05/31/2016  . 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 Dyslipidemia associated with type 2 diabetes mellitus ([place]) 01/21/2016  . Uncontrolled type II diabetes with peripheral autonomic neuropathy ([place]) 01/17/2016  . Weight gain 12/20/2015  . Edema of right lower extremity 12/20/2015  . Cough 11/26/2015  . GERD (gastroesophageal reflux disease) 10/08/2015  . Amputation of left lower extremity below knee ([place]) 08/21/2015  . Phantom limb pain ([place])   . Anemia of chronic disease   . BPH (benign prostatic  hyperplasia)   . ETOH abuse   . PVD (peripheral vascular disease) ([place])   . Chronic hepatitis C without hepatic coma ([place])   . History of CVA (cerebrovascular accident) without residual deficits   . Hyponatremia   . Chronic kidney disease, stage 3 07/18/2015  . Essential hypertension 07/18/2015  . S/P lumbar spinal fusion 11/25/2013  . Melena 12/03/2011  . Hemiparesthesia 02/03/2011  . Polysubstance abuse ([place]) 02/03/2011  . Tobacco abuse 02/03/2011    Orientation RESPIRATION BLADDER Height & Weight     Self, Place, Situation, Time  Normal Incontinent Weight: 263 lb 7.2 oz (119.5 kg) Height:  6' (182.9 cm)  BEHAVIORAL SYMPTOMS/MOOD NEUROLOGICAL BOWEL NUTRITION STATUS      Incontinent Diet(heart healthy/carb modified)  AMBULATORY STATUS COMMUNICATION OF NEEDS Skin   Total Care(Transfers from bed to w/c and w/c to bed. Has prosthetic legs that he has been working on with therapy.)   Normal                       Personal Care Assistance Level of Assistance  Bathing, Dressing, Feeding Bathing Assistance: Limited assistance Feeding assistance: Independent Dressing Assistance: Limited assistance     Functional Limitations Info  Sight, Speech, Hearing Sight Info: Adequate Hearing Info: Adequate Speech Info: Adequate    SPECIAL CARE FACTORS FREQUENCY                       Contractures Contractures Info: Not present    Additional Factors Info  Code Status, Allergies Code Status Info: DNR Allergies Info: NKA  Current Medications (02/24/2019):  This is the current hospital active medication list Current Facility-Administered Medications  Medication Dose Route Frequency Provider Last Rate Last Admin  . 0.9 %  sodium chloride infusion   Intravenous Continuous Wynetta Emery, Clanford L, MD 50 mL/hr at 02/23/19 1353 New Bag at 02/23/19 1353  . acetaminophen (TYLENOL) tablet 650 mg  650 mg Oral Q6H PRN Johnson, Clanford L, MD      . albuterol (VENTOLIN HFA)  108 (90 Base) MCG/ACT inhaler 2 puff  2 puff Inhalation Q6H PRN Barton Dubois, MD      . ascorbic acid (VITAMIN C) tablet 500 mg  500 mg Oral Daily Johnson, Clanford L, MD   500 mg at 02/24/19 0908  . aspirin EC tablet 81 mg  81 mg Oral Daily Johnson, Clanford L, MD   81 mg at 02/24/19 0908  . atorvastatin (LIPITOR) tablet 10 mg  10 mg Oral q1800 Johnson, Clanford L, MD   10 mg at 02/23/19 1705  . baclofen (LIORESAL) tablet 5 mg  5 mg Oral TID PRN Wynetta Emery, Clanford L, MD   5 mg at 02/23/19 2150  . carvedilol (COREG) tablet 3.125 mg  3.125 mg Oral BID WC Johnson, Clanford L, MD   3.125 mg at 02/24/19 0908  . chlorpheniramine-HYDROcodone (TUSSIONEX) 10-8 MG/5ML suspension 5 mL  5 mL Oral Q12H PRN Wynetta Emery, Clanford L, MD   5 mL at 02/20/19 2228  . cholecalciferol (VITAMIN D3) tablet 50 mcg  50 mcg Oral Daily Wynetta Emery, Clanford L, MD   50 mcg at 02/24/19 0908  . ciprofloxacin (CIPRO) tablet 250 mg  250 mg Oral BID Barton Dubois, MD   250 mg at 02/24/19 0908  . clopidogrel (PLAVIX) tablet 75 mg  75 mg Oral Q breakfast Wynetta Emery, Clanford L, MD   75 mg at 02/24/19 0908  . dexamethasone (DECADRON) tablet 6 mg  6 mg Oral Q24H Johnson, Clanford L, MD   6 mg at 02/23/19 1351  . enoxaparin (LOVENOX) injection 60 mg  60 mg Subcutaneous Q24H Barton Dubois, MD   60 mg at 02/23/19 2149  . gabapentin (NEURONTIN) capsule 600 mg  600 mg Oral TID Wynetta Emery, Clanford L, MD   600 mg at 02/24/19 0907  . guaiFENesin-dextromethorphan (ROBITUSSIN DM) 100-10 MG/5ML syrup 10 mL  10 mL Oral Q4H PRN Johnson, Clanford L, MD      . insulin aspart (novoLOG) injection 0-20 Units  0-20 Units Subcutaneous TID WC Johnson, Clanford L, MD   4 Units at 02/24/19 1152  . insulin aspart (novoLOG) injection 0-5 Units  0-5 Units Subcutaneous QHS Murlean Iba, MD   2 Units at 02/23/19 2148  . insulin aspart (novoLOG) injection 10 Units  10 Units Subcutaneous TID WC Johnson, Clanford L, MD   10 Units at 02/24/19 1153  . insulin glargine  (LANTUS) injection 30 Units  30 Units Subcutaneous QHS Barton Dubois, MD   30 Units at 02/23/19 2152  . magnesium hydroxide (MILK OF MAGNESIA) suspension 30 mL  30 mL Oral Daily Emokpae, Courage, MD   30 mL at 02/24/19 1028  . ondansetron (ZOFRAN) tablet 4 mg  4 mg Oral Q6H PRN Johnson, Clanford L, MD       Or  . ondansetron (ZOFRAN) injection 4 mg  4 mg Intravenous Q6H PRN Johnson, Clanford L, MD      . oxyCODONE (Oxy IR/ROXICODONE) immediate release tablet 5-10 mg  5-10 mg Oral Q6H PRN Barton Dubois, MD   10 mg at 02/24/19 0422  .  pantoprazole (PROTONIX) EC tablet 40 mg  40 mg Oral Daily Johnson, Clanford L, MD   40 mg at 02/24/19 0908  . polyethylene glycol (MIRALAX / GLYCOLAX) packet 17 g  17 g Oral BID Barton Dubois, MD   17 g at 02/24/19 0907  . senna-docusate (Senokot-S) tablet 1 tablet  1 tablet Oral BID Barton Dubois, MD   1 tablet at 02/24/19 [phone]  . tamsulosin (FLOMAX) capsule 0.4 mg  0.4 mg Oral Daily Johnson, Clanford L, MD   0.4 mg at 02/24/19 0908  . traZODone (DESYREL) tablet 50 mg  50 mg Oral QHS Barton Dubois, MD   50 mg at 02/23/19 2150  . vitamin B-12 (CYANOCOBALAMIN) tablet 1,000 mcg  1,000 mcg Oral Daily Wynetta Emery, Clanford L, MD   1,000 mcg at 02/24/19 0909  . zinc sulfate capsule 220 mg  220 mg Oral Daily Johnson, Clanford L, MD   220 mg at 02/24/19 0908     Discharge Medications: Please see discharge summary for a list of discharge medications.  Relevant Imaging Results:  Relevant Lab Results:   Additional Information [id number]  Ihor Gully, LCSW

## 2019-02-24 NOTE — NC FL2 (Signed)
[place] LEVEL OF CARE SCREENING TOOL     IDENTIFICATION  Patient Name: Cory Weber Birthdate: August 10, 1950 Sex: male Admission Date (Current Location): 02/20/2019  [facility] and [place] Number:  [employer] and Address:  [place] [address], [place]      Provider Number: [id number]  Attending Physician Name and Address:  Roxan Hockey, MD  Relative Name and Phone Number:       Current Level of Care: Hospital Recommended Level of Care: [place] Prior Approval Number:    Date Approved/Denied:   PASRR Number:    Discharge Plan: SNF    Current Diagnoses: Patient Active Problem List   Diagnosis Date Noted  . Pneumonia due to COVID-19 virus 02/24/2019  . Acute respiratory failure with hypoxia ([place]) 02/20/2019  . COVID-19 virus detected 02/20/2019  . UTI (urinary tract infection) 02/20/2019  . DNR (do not resuscitate) 02/20/2019  . PAD (peripheral artery disease) ([place]) 08/22/2016  . Osteomyelitis of foot ([place]) 07/08/2016  . S/P transmetatarsal amputation of foot, right ([place]) 06/26/2016  . Constipation due to opioid therapy 06/15/2016  . Physical deconditioning 06/10/2016  . Wet gangrene ([place]) 06/10/2016  . Chronic diastolic CHF (congestive heart failure) ([place]) 06/10/2016  . Ischemic pain of right foot 06/04/2016  . Ischemia of foot 05/31/2016  . 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 Dyslipidemia associated with type 2 diabetes mellitus ([place]) 01/21/2016  . Uncontrolled type II diabetes with peripheral autonomic neuropathy ([place]) 01/17/2016  . Weight gain 12/20/2015  . Edema of right lower extremity 12/20/2015  . Cough 11/26/2015  . GERD (gastroesophageal reflux disease) 10/08/2015  . Amputation of left lower extremity below knee ([place]) 08/21/2015  . Phantom limb pain ([place])   . Anemia of chronic disease   . BPH (benign prostatic  hyperplasia)   . ETOH abuse   . PVD (peripheral vascular disease) ([place])   . Chronic hepatitis C without hepatic coma ([place])   . History of CVA (cerebrovascular accident) without residual deficits   . Hyponatremia   . Chronic kidney disease, stage 3 07/18/2015  . Essential hypertension 07/18/2015  . S/P lumbar spinal fusion 11/25/2013  . Melena 12/03/2011  . Hemiparesthesia 02/03/2011  . Polysubstance abuse ([place]) 02/03/2011  . Tobacco abuse 02/03/2011    Orientation RESPIRATION BLADDER Height & Weight     Self, Place, Situation, Time  Normal Incontinent Weight: 263 lb 7.2 oz (119.5 kg) Height:  6' (182.9 cm)  BEHAVIORAL SYMPTOMS/MOOD NEUROLOGICAL BOWEL NUTRITION STATUS      Incontinent Diet(heart healthy/carb modified)  AMBULATORY STATUS COMMUNICATION OF NEEDS Skin   Total Care(Transfers from bed to w/c and w/c to bed. Has prosthetic legs that he has been working on with therapy.)   Normal                       Personal Care Assistance Level of Assistance  Bathing, Dressing, Feeding Bathing Assistance: Limited assistance Feeding assistance: Independent Dressing Assistance: Limited assistance     Functional Limitations Info  Sight, Speech, Hearing Sight Info: Adequate Hearing Info: Adequate Speech Info: Adequate    SPECIAL CARE FACTORS FREQUENCY                       Contractures Contractures Info: Not present    Additional Factors Info  Isolation Precautions Code Status Info: DNR Allergies Info: NKA  Isolation Precautions Info: COVID-19 + on 1.09.2021 08/23/16 MRSA PCR MRSA     Current Medications (02/24/2019):  This is the current hospital active medication list Current Facility-Administered Medications  Medication Dose Route Frequency Provider Last Rate Last Admin  . 0.9 %  sodium chloride infusion   Intravenous Continuous Wynetta Emery, Clanford L, MD 50 mL/hr at 02/23/19 1353 New Bag at 02/23/19 1353  . acetaminophen (TYLENOL) tablet 650 mg  650 mg  Oral Q6H PRN Johnson, Clanford L, MD      . albuterol (VENTOLIN HFA) 108 (90 Base) MCG/ACT inhaler 2 puff  2 puff Inhalation Q6H PRN Barton Dubois, MD      . ascorbic acid (VITAMIN C) tablet 500 mg  500 mg Oral Daily Johnson, Clanford L, MD   500 mg at 02/24/19 0908  . aspirin EC tablet 81 mg  81 mg Oral Daily Johnson, Clanford L, MD   81 mg at 02/24/19 0908  . atorvastatin (LIPITOR) tablet 10 mg  10 mg Oral q1800 Johnson, Clanford L, MD   10 mg at 02/23/19 1705  . baclofen (LIORESAL) tablet 5 mg  5 mg Oral TID PRN Wynetta Emery, Clanford L, MD   5 mg at 02/23/19 2150  . carvedilol (COREG) tablet 3.125 mg  3.125 mg Oral BID WC Johnson, Clanford L, MD   3.125 mg at 02/24/19 0908  . chlorpheniramine-HYDROcodone (TUSSIONEX) 10-8 MG/5ML suspension 5 mL  5 mL Oral Q12H PRN Wynetta Emery, Clanford L, MD   5 mL at 02/20/19 2228  . cholecalciferol (VITAMIN D3) tablet 50 mcg  50 mcg Oral Daily Wynetta Emery, Clanford L, MD   50 mcg at 02/24/19 0908  . ciprofloxacin (CIPRO) tablet 250 mg  250 mg Oral BID Barton Dubois, MD   250 mg at 02/24/19 0908  . clopidogrel (PLAVIX) tablet 75 mg  75 mg Oral Q breakfast Wynetta Emery, Clanford L, MD   75 mg at 02/24/19 0908  . dexamethasone (DECADRON) tablet 6 mg  6 mg Oral Q24H Johnson, Clanford L, MD   6 mg at 02/23/19 1351  . enoxaparin (LOVENOX) injection 60 mg  60 mg Subcutaneous Q24H Barton Dubois, MD   60 mg at 02/23/19 2149  . gabapentin (NEURONTIN) capsule 600 mg  600 mg Oral TID Wynetta Emery, Clanford L, MD   600 mg at 02/24/19 0907  . guaiFENesin-dextromethorphan (ROBITUSSIN DM) 100-10 MG/5ML syrup 10 mL  10 mL Oral Q4H PRN Johnson, Clanford L, MD      . insulin aspart (novoLOG) injection 0-20 Units  0-20 Units Subcutaneous TID WC Johnson, Clanford L, MD   4 Units at 02/24/19 1152  . insulin aspart (novoLOG) injection 0-5 Units  0-5 Units Subcutaneous QHS Murlean Iba, MD   2 Units at 02/23/19 2148  . insulin aspart (novoLOG) injection 10 Units  10 Units Subcutaneous TID WC  Johnson, Clanford L, MD   10 Units at 02/24/19 1153  . insulin glargine (LANTUS) injection 30 Units  30 Units Subcutaneous QHS Barton Dubois, MD   30 Units at 02/23/19 2152  . magnesium hydroxide (MILK OF MAGNESIA) suspension 30 mL  30 mL Oral Daily Emokpae, Courage, MD   30 mL at 02/24/19 1028  . ondansetron (ZOFRAN) tablet 4 mg  4 mg Oral Q6H PRN Johnson, Clanford L, MD       Or  . ondansetron (ZOFRAN) injection 4 mg  4 mg Intravenous Q6H PRN Johnson, Clanford L, MD      . oxyCODONE (Oxy IR/ROXICODONE) immediate release tablet 5-10 mg  5-10  mg Oral Q6H PRN Barton Dubois, MD   10 mg at 02/24/19 0422  . pantoprazole (PROTONIX) EC tablet 40 mg  40 mg Oral Daily Johnson, Clanford L, MD   40 mg at 02/24/19 0908  . polyethylene glycol (MIRALAX / GLYCOLAX) packet 17 g  17 g Oral BID Barton Dubois, MD   17 g at 02/24/19 0907  . senna-docusate (Senokot-S) tablet 1 tablet  1 tablet Oral BID Barton Dubois, MD   1 tablet at 02/24/19 [phone]  . tamsulosin (FLOMAX) capsule 0.4 mg  0.4 mg Oral Daily Johnson, Clanford L, MD   0.4 mg at 02/24/19 0908  . traZODone (DESYREL) tablet 50 mg  50 mg Oral QHS Barton Dubois, MD   50 mg at 02/23/19 2150  . vitamin B-12 (CYANOCOBALAMIN) tablet 1,000 mcg  1,000 mcg Oral Daily Wynetta Emery, Clanford L, MD   1,000 mcg at 02/24/19 0909  . zinc sulfate capsule 220 mg  220 mg Oral Daily Johnson, Clanford L, MD   220 mg at 02/24/19 0908     Discharge Medications: Please see discharge summary for a list of discharge medications.  Relevant Imaging Results:  Relevant Lab Results:   Additional Information [phone]  Ihor Gully, LCSW

## 2019-02-24 NOTE — Clinical Social Work Note (Addendum)
Debbie at [facility] notified of discharge. Discharge clinicals sent.  Sister, Geannie Risen notified of discharge.  RN to call report. EMS to transport.     Nemesis [place], Clydene Pugh, LCSW

## 2019-02-24 NOTE — Progress Notes (Signed)
Patient Demographics:    Cory Weber, is a 69 y.o. male, DOB - March 11, 1950, ZYS:[id number]  Admit date - 02/20/2019   Admitting Physician Murlean Iba, MD  Outpatient Primary MD for the patient is Iona Beard, MD  LOS - 4   Chief Complaint  Patient presents with  . Shortness of Breath        Subjective:    [name] today has no fevers, no emesis,  No chest pain, resting comfortably eating and drinking okay no new concerns  Assessment  & Plan :    Principal Problem:   Pneumonia due to COVID-19 virus Active Problems:   COVID-19 virus detected   Polysubstance abuse ([place])   Tobacco abuse   S/P lumbar spinal fusion   Chronic kidney disease, stage 3   Essential hypertension   Chronic hepatitis C without hepatic coma (HCC)   History of CVA (cerebrovascular accident) without residual deficits   Anemia of chronic disease   BPH (benign prostatic hyperplasia)   GERD (gastroesophageal reflux disease)   Dyslipidemia associated with type 2 diabetes mellitus (HCC)   Chronic gout due to renal impairment involving toe of right foot without tophus   Physical deconditioning   Chronic diastolic CHF (congestive heart failure) (HCC)   S/P transmetatarsal amputation of foot, right (HCC)   PAD (peripheral artery disease) (HCC)   Acute respiratory failure with hypoxia (HCC)   UTI (urinary tract infection)   DNR (do not resuscitate)   Brief Narrative: As per H&P by Dr. Wynetta Emery on 02/20/19 69 y.o.malewitha complexmedical history significantfor diastolic CHF, BPH, stage III CKD, type 2 diabetes mellitus, cerebrovascular disease status post CVA, chronic hepatitis C, history of substance abuse and alcohol, GERD who is a longtime resident of the [facility]. He reports that he was diagnosed with COVID-19 infection several days ago at the facility. He reports that he has continued to have increasing  symptoms of shortness of breath and fatigue and weakness. He has had a persistent dry cough chills no fever and nasal congestion. He denies having chest pain. Orts urinary frequency. He denies nausea vomiting. He was sent to the emergency department due to complaints of increasing shortness of breath. He was noted to be hypoxic at the facility with a pulse ox in the 86% range on room air.  ED Course:Hypoxic on arrival 86% on room air placed on 2 L supplemental oxygen with improvement into the low 90s. Patient symptoms improved after supplemental oxygen. Temp 98.7, pulse 80, blood pressure 132/76, blood sugar 164, triglycerides 127, ferritin 49, CRP 1.2, procalcitonin less than 0.10, WBC 6.3, hemoglobin 9.2 D-dimer 1.39, fibrinogen 542, urinalysis with large leukocytes and nitrite positive and greater than 50 WBC per high-power field, SARS 2 coronavirus test positive.   Assessment & Plan:  1-acute respiratory failure with hypoxia (Cory Weber) -In the setting of COVID-19 pneumonia -Patient completed IV steroids and IV remdesivir -Hypoxia resolved  -Treated with vitamin C and zinc -Treated with bronchodilators -Overall much improved  2-suspicion of chronic kidney disease, stage 3 -Renal function currently pretty good with a creatinine of 1.1, which appears to be baseline -In retrospect no evidence for CKD 3 at this time (previously mentioned diagnosis of CKD stage III currently not confirmed)  3-Essential hypertension -  Stable, continue amlodipine 5 mg daily, Coreg 3.125 mg twice daily, reduce lisinopril to 10 mg daily  4-Chronic hepatitis C without hepatic coma ([place]) -Continue outpatient follow-up with gastroenterology service - LFTs within normal limits.  5-History of CVA (cerebrovascular accident) without residual deficits -No acute neurologic deficits -Continue secondary prevention -Continue simvastatin, aspirin and Plavix.  6-EnterobacterUTI -Culture results noted,  clinically much improved, okay to complete p.o. Cipro as ordered   7)Gastroesophageal reflux disease/GI protection -Omeprazole/PPI recommended  8-type 2 diabetes mellitus with nephropathy --A1c 6.8 on 02/20/2019, -  Lantus insulin 26 units daily along with sliding scale coverage  9-anemia in the setting of chronic kidney disease -Hemoglobin around 9 at baseline -No signs of overt bleed  10-peripheral arterial disease and bilateral amputation -Continue simvastatin, aspirin and Plavix -Bilateral stumps are without signs of acute ulcers or infection.   11-DNR -Patient with no issues to be a DO NOT RESUSCITATE prior to admission -Will respect and honor his wishes   DVT prophylaxis:Lovenox Code Status:DNR Family Communication:No family at bedside. Disposition--patient has completed treatment for COVID-19 respiratory infection with remdesivir and steroids, hypoxia has resolved, okay to discharge back to [facility] Discharge Condition: Stable Follow UP--PCP as advised  Code Status : DNR  Family Communication:   NA (patient is alert, awake and coherent)  Consults  :  na  DVT Prophylaxis  :  Lovenox -      Lab Results  Component Value Date   PLT 262 02/24/2019    Inpatient Medications  Scheduled Meds: . vitamin C  500 mg Oral Daily  . aspirin EC  81 mg Oral Daily  . atorvastatin  10 mg Oral q1800  . carvedilol  3.125 mg Oral BID WC  . cholecalciferol  50 mcg Oral Daily  . ciprofloxacin  250 mg Oral BID  . clopidogrel  75 mg Oral Q breakfast  . dexamethasone  6 mg Oral Q24H  . enoxaparin (LOVENOX) injection  60 mg Subcutaneous Q24H  . gabapentin  600 mg Oral TID  . insulin aspart  0-20 Units Subcutaneous TID WC  . insulin aspart  0-5 Units Subcutaneous QHS  . insulin aspart  10 Units Subcutaneous TID WC  . insulin glargine  30 Units Subcutaneous QHS  . magnesium hydroxide  30 mL Oral Daily  . pantoprazole  40 mg Oral Daily  . polyethylene glycol  17 g  Oral BID  . senna-docusate  1 tablet Oral BID  . tamsulosin  0.4 mg Oral Daily  . traZODone  50 mg Oral QHS  . cyanocobalamin  1,000 mcg Oral Daily  . zinc sulfate  220 mg Oral Daily   Continuous Infusions: PRN Meds:.acetaminophen, albuterol, baclofen, chlorpheniramine-HYDROcodone, guaiFENesin-dextromethorphan, ondansetron **OR** ondansetron (ZOFRAN) IV, oxyCODONE    Anti-infectives (From admission, onward)   Start     Dose/Rate Route Frequency Ordered Stop   02/24/19 0000  ciprofloxacin (CIPRO) 250 MG tablet     250 mg Oral 2 times daily 02/24/19 1346 02/27/19 2359   02/23/19 1300  ciprofloxacin (CIPRO) tablet 250 mg  Status:  Discontinued     250 mg Oral 2 times daily 02/23/19 1224 02/23/19 1227   02/23/19 1300  ciprofloxacin (CIPRO) tablet 250 mg     250 mg Oral 2 times daily 02/23/19 1227 02/28/19 0759   02/21/19 1000  remdesivir 100 mg in sodium chloride 0.9 % 100 mL IVPB     100 mg 200 mL/hr over 30 Minutes Intravenous Daily 02/20/19 1330 02/24/19 1059   02/20/19  2130  cefTRIAXone (ROCEPHIN) 1 g in sodium chloride 0.9 % 100 mL IVPB     1 g 200 mL/hr over 30 Minutes Intravenous Every 24 hours 02/20/19 2120 02/22/19 2040   02/20/19 1400  remdesivir 200 mg in sodium chloride 0.9% 250 mL IVPB     200 mg 580 mL/hr over 30 Minutes Intravenous Once 02/20/19 1330 02/20/19 1759        Objective:   Vitals:   02/23/19 2054 02/23/19 2108 02/24/19 0626 02/24/19 1431  BP:  (!) 176/70 (!) 141/55 (!) 156/70  Pulse:  (!) 53 (!) 56 61  Resp:  20 20 16   Temp:  98.5 F (36.9 C) 97.8 F (36.6 C) 98 F (36.7 C)  TempSrc:  Oral Oral Oral  SpO2: 93% 92% (!) 89% 93%  Weight:      Height:        Wt Readings from Last 3 Encounters:  02/20/19 119.5 kg  10/15/18 118.3 kg  09/22/18 116.8 kg     Intake/Output Summary (Last 24 hours) at 02/24/2019 1927 Last data filed at 02/24/2019 1822 Gross per 24 hour  Intake 1310 ml  Output 3400 ml  Net -2090 ml     Physical Exam  Gen:-  Awake Alert,  In no apparent distress , speaking in complete sentences HEENT:- [place].AT, No sclera icterus Neck-Supple Neck,No JVD,.  Lungs-  CTAB , fair symmetrical air movement CV- S1, S2 normal, regular  Abd-  +ve B.Sounds, Abd Soft, No tenderness,    Extremity/Skin:- No  edema, femoral pulses present  Psych-affect is appropriate, oriented x3 Neuro-no new focal deficits, no tremors MSK-bilateral AKA  Data Review:   Micro Results Recent Results (from the past 240 hour(s))  Blood Culture (routine x 2)     Status: None (Preliminary result)   Collection Time: 02/20/19 12:01 PM   Specimen: BLOOD LEFT HAND  Result Value Ref Range Status   Specimen Description   Final    BLOOD LEFT HAND BOTTLES DRAWN AEROBIC AND ANAEROBIC   Special Requests Blood Culture adequate volume  Final   Culture   Final    NO GROWTH 4 DAYS Performed at [facility], [address], [place], [place] [address]    Report Status PENDING  Incomplete  Blood Culture (routine x 2)     Status: None (Preliminary result)   Collection Time: 02/20/19 12:05 PM   Specimen: BLOOD RIGHT HAND  Result Value Ref Range Status   Specimen Description   Final    BLOOD RIGHT HAND BOTTLES DRAWN AEROBIC AND ANAEROBIC   Special Requests Blood Culture adequate volume  Final   Culture   Final    NO GROWTH 4 DAYS Performed at [facility], [address], [place], [place] [address]    Report Status PENDING  Incomplete  Urine culture     Status: Abnormal   Collection Time: 02/20/19  1:48 PM   Specimen: Urine, Clean Catch  Result Value Ref Range Status   Specimen Description   Final    URINE, CLEAN CATCH Performed at [facility], [address] [place], [place] [address]    Special Requests   Final    NONE Performed at [facility], [address] [place], [place] [address]    Culture >=100,000 COLONIES/mL ENTEROBACTER CLOACAE (A)  Final   Report Status 02/23/2019 FINAL  Final   Organism ID, Bacteria ENTEROBACTER CLOACAE (A)   Final      Susceptibility   Enterobacter cloacae - MIC*  CEFAZOLIN >=64 RESISTANT Resistant     CEFTRIAXONE 32 RESISTANT Resistant     CIPROFLOXACIN <=0.25 SENSITIVE Sensitive     GENTAMICIN <=1 SENSITIVE Sensitive     IMIPENEM 0.5 SENSITIVE Sensitive     NITROFURANTOIN 32 SENSITIVE Sensitive     TRIMETH/SULFA <=20 SENSITIVE Sensitive     PIP/TAZO <=4 SENSITIVE Sensitive     * >=100,000 COLONIES/mL ENTEROBACTER CLOACAE  MRSA PCR Screening     Status: None   Collection Time: 02/22/19  1:32 PM   Specimen: Nasal Mucosa; Nasopharyngeal  Result Value Ref Range Status   MRSA by PCR NEGATIVE NEGATIVE Final    Comment:        The GeneXpert MRSA Assay (FDA approved for NASAL specimens only), is one component of a comprehensive MRSA colonization surveillance program. It is not intended to diagnose MRSA infection nor to guide or monitor treatment for MRSA infections. Performed at [facility], [address], [place], [place] [address]     Radiology Reports Portable chest 1 View  Result Date: 02/21/2019 CLINICAL DATA:  Shortness of breath. COVID-19 positive. EXAM: PORTABLE CHEST 1 VIEW COMPARISON:  02/20/2019 FINDINGS: The cardiomediastinal silhouette is unchanged with upper limits of normal heart size. There is new subtle patchy opacity in the right mid lung. The lungs are otherwise clear. No pleural effusion or pneumothorax is identified. IMPRESSION: New mild right midlung opacity which may reflect early pneumonia. Electronically Signed   By: Logan Bores M.D.   On: 02/21/2019 07:59   DG Chest Port 1 View  Result Date: 02/20/2019 CLINICAL DATA:  Shortness of breath.  COVID-19 positive EXAM: PORTABLE CHEST 1 VIEW COMPARISON:  August 24, 2016 FINDINGS: The lungs are clear. Heart is upper normal in size with pulmonary vascularity normal. No adenopathy. No bone lesions. A benign exostosis is noted along the inferior aspect of the distal right clavicle, stable. IMPRESSION: Lungs clear.  Stable  cardiac silhouette.  No adenopathy. Electronically Signed   By: Lowella Grip III M.D.   On: 02/20/2019 11:40     CBC Recent Labs  Lab 02/20/19 1202 02/21/19 0618 02/22/19 0543 02/23/19 0456 02/24/19 0633  WBC 6.3 5.3 5.8 6.6 7.6  HGB 9.2* 8.6* 8.5* 8.7* 8.9*  HCT 30.4* 28.3* 28.3* 29.2* 29.8*  PLT 262 260 255 258 262  MCV 98.7 97.6 97.9 98.0 96.4  MCH 29.9 29.7 29.4 29.2 28.8  MCHC 30.3 30.4 30.0 29.8* 29.9*  RDW 14.0 13.6 13.7 13.7 13.5  LYMPHSABS 2.3 1.2 1.3 1.0 1.7  MONOABS 0.5 0.4 0.4 0.3 0.5  EOSABS 0.1 0.0 0.0 0.0 0.0  BASOSABS 0.0 0.0 0.0 0.0 0.0    Chemistries  Recent Labs  Lab 02/20/19 1202 02/21/19 0618 02/22/19 0543 02/23/19 0456 02/24/19 0633  NA 137 136 136 133* 135  K 4.4 4.8 4.8 5.0 4.2  CL 99 98 98 98 98  CO2 33* 29 30 30 31   GLUCOSE 164* 222* 223* 305* 221*  BUN 19 22 25* 27* 24*  CREATININE 1.22 1.10 1.15 1.27* 1.10  CALCIUM 9.1 8.9 8.9 8.4* 8.7*  MG  --  2.1 2.3 2.3 2.4  AST 12* 13* 12* 24 20  ALT 13 12 14  32 33  ALKPHOS 52 47 46 51 48  BILITOT 0.5 0.4 0.2* 0.2* 0.4   ------------------------------------------------------------------------------------------------------------------ No results for input(s): CHOL, HDL, LDLCALC, TRIG, CHOLHDL, LDLDIRECT in the last 72 hours.  Lab Results  Component Value Date   HGBA1C 6.8 (H) 02/20/2019   ------------------------------------------------------------------------------------------------------------------ No results for  input(s): TSH, T4TOTAL, T3FREE, THYROIDAB in the last 72 hours.  Invalid input(s): FREET3 ------------------------------------------------------------------------------------------------------------------ Recent Labs    02/23/19 0456 02/24/19 0633  FERRITIN 126 105    Coagulation profile No results for input(s): INR, PROTIME in the last 168 hours.  Recent Labs    02/23/19 0456 02/24/19 0633  DDIMER 0.56* 0.54*    Cardiac Enzymes No results for input(s): CKMB,  TROPONINI, MYOGLOBIN in the last 168 hours.  Invalid input(s): CK ------------------------------------------------------------------------------------------------------------------    Component Value Date/Time   BNP 52.4 12/22/2015 1551     Yitta Gongaware M.D on 02/24/2019 at 7:27 PM  Go to [email or web address] - for contact info  [facility] - Office  [phone]

## 2019-02-24 NOTE — Progress Notes (Signed)
Inpatient Diabetes Program Recommendations  AACE/ADA: New Consensus Statement on Inpatient Glycemic Control (2015)  Target Ranges:  Prepandial:   less than 140 mg/dL      Peak postprandial:   less than 180 mg/dL (1-2 hours)      Critically ill patients:  140 - 180 mg/dL   Lab Results  Component Value Date   GLUCAP 204 (H) 02/24/2019   HGBA1C 6.8 (H) 02/20/2019    Review of Glycemic Control Results for BEAUMONT, AUSTAD (MRN [id number]) as of 02/24/2019 09:22  Ref. Range 02/23/2019 08:03 02/23/2019 11:08 02/23/2019 16:00 02/23/2019 21:06 02/24/2019 01:51 02/24/2019 03:11 02/24/2019 07:49  Glucose-Capillary Latest Ref Range: 70 - 99 mg/dL 242 (H) 282 (H) 186 (H) 234 (H) 245 (H) 196 (H) 204 (H)   Diabetes history: DM 2 Outpatient Diabetes medications: Lantus 24 units, Novolog 2-8 units tid + Novolog 5 units tid meal coverage, Metformin 500 mg bid Current orders for Inpatient glycemic control:  Lantus 30 units qhs Novolog 0-20 units tid + hs Novolog 10 units tid meal coverage   A1c 6.8% on 1/9 Decadron 6 mg Q24 hours BUN/Creat: 27/1.27 Supplements: None currently   Inpatient Diabetes Program Recommendations:    Consider increasing Lantus to 35 units  Thanks,  Tama Headings RN, MSN, BC-ADM Inpatient Diabetes Coordinator Team Pager [phone] (8a-5p)

## 2019-02-24 NOTE — Discharge Instructions (Signed)
1)Continue strict isolation for up to 21 days for the date of initial diagnosis (starting 02/20/2019) 2)Avoid ibuprofen/Advil/Aleve/Motrin/Goody Powders/Naproxen/BC powders/Meloxicam/Diclofenac/Indomethacin and other Nonsteroidal anti-inflammatory medications as these will make you more likely to bleed and can cause stomach ulcers, can also cause Kidney problems.

## 2019-02-25 DIAGNOSIS — K219 Gastro-esophageal reflux disease without esophagitis: Secondary | ICD-10-CM | POA: Diagnosis present

## 2019-02-25 DIAGNOSIS — E1143 Type 2 diabetes mellitus with diabetic autonomic (poly)neuropathy: Secondary | ICD-10-CM | POA: Diagnosis present

## 2019-02-25 DIAGNOSIS — T8189XD Other complications of procedures, not elsewhere classified, subsequent encounter: Secondary | ICD-10-CM | POA: Diagnosis not present

## 2019-02-25 DIAGNOSIS — I739 Peripheral vascular disease, unspecified: Secondary | ICD-10-CM | POA: Diagnosis present

## 2019-02-25 DIAGNOSIS — R05 Cough: Secondary | ICD-10-CM | POA: Diagnosis not present

## 2019-02-25 DIAGNOSIS — B1710 Acute hepatitis C without hepatic coma: Secondary | ICD-10-CM | POA: Diagnosis present

## 2019-02-25 DIAGNOSIS — Z89511 Acquired absence of right leg below knee: Secondary | ICD-10-CM | POA: Diagnosis not present

## 2019-02-25 DIAGNOSIS — I5032 Chronic diastolic (congestive) heart failure: Secondary | ICD-10-CM | POA: Diagnosis present

## 2019-02-25 DIAGNOSIS — J1282 Pneumonia due to coronavirus disease 2019: Secondary | ICD-10-CM

## 2019-02-25 DIAGNOSIS — Z7401 Bed confinement status: Secondary | ICD-10-CM | POA: Diagnosis not present

## 2019-02-25 DIAGNOSIS — E785 Hyperlipidemia, unspecified: Secondary | ICD-10-CM | POA: Diagnosis present

## 2019-02-25 DIAGNOSIS — B182 Chronic viral hepatitis C: Secondary | ICD-10-CM | POA: Diagnosis present

## 2019-02-25 DIAGNOSIS — U071 COVID-19: Secondary | ICD-10-CM | POA: Diagnosis present

## 2019-02-25 DIAGNOSIS — I7092 Chronic total occlusion of artery of the extremities: Secondary | ICD-10-CM | POA: Diagnosis present

## 2019-02-25 DIAGNOSIS — I10 Essential (primary) hypertension: Secondary | ICD-10-CM | POA: Diagnosis present

## 2019-02-25 DIAGNOSIS — M6281 Muscle weakness (generalized): Secondary | ICD-10-CM | POA: Diagnosis present

## 2019-02-25 DIAGNOSIS — M25519 Pain in unspecified shoulder: Secondary | ICD-10-CM | POA: Diagnosis present

## 2019-02-25 DIAGNOSIS — I120 Hypertensive chronic kidney disease with stage 5 chronic kidney disease or end stage renal disease: Secondary | ICD-10-CM | POA: Diagnosis present

## 2019-02-25 DIAGNOSIS — D649 Anemia, unspecified: Secondary | ICD-10-CM | POA: Diagnosis present

## 2019-02-25 DIAGNOSIS — M25511 Pain in right shoulder: Secondary | ICD-10-CM | POA: Diagnosis present

## 2019-02-25 DIAGNOSIS — Z4781 Encounter for orthopedic aftercare following surgical amputation: Secondary | ICD-10-CM | POA: Diagnosis not present

## 2019-02-25 DIAGNOSIS — E1169 Type 2 diabetes mellitus with other specified complication: Secondary | ICD-10-CM | POA: Diagnosis present

## 2019-02-25 DIAGNOSIS — R0902 Hypoxemia: Secondary | ICD-10-CM | POA: Diagnosis not present

## 2019-02-25 DIAGNOSIS — N1832 Chronic kidney disease, stage 3b: Secondary | ICD-10-CM | POA: Diagnosis present

## 2019-02-25 DIAGNOSIS — Z89512 Acquired absence of left leg below knee: Secondary | ICD-10-CM | POA: Diagnosis not present

## 2019-02-25 DIAGNOSIS — R2689 Other abnormalities of gait and mobility: Secondary | ICD-10-CM | POA: Diagnosis present

## 2019-02-25 DIAGNOSIS — D509 Iron deficiency anemia, unspecified: Secondary | ICD-10-CM | POA: Diagnosis not present

## 2019-02-25 DIAGNOSIS — Z23 Encounter for immunization: Secondary | ICD-10-CM | POA: Diagnosis not present

## 2019-02-25 DIAGNOSIS — N390 Urinary tract infection, site not specified: Secondary | ICD-10-CM | POA: Diagnosis not present

## 2019-02-25 DIAGNOSIS — F329 Major depressive disorder, single episode, unspecified: Secondary | ICD-10-CM | POA: Diagnosis not present

## 2019-02-25 LAB — CBC WITH DIFFERENTIAL/PLATELET
Abs Immature Granulocytes: 0.09 10*3/uL — ABNORMAL HIGH (ref 0.00–0.07)
Basophils Absolute: 0 10*3/uL (ref 0.0–0.1)
Basophils Relative: 0 %
Eosinophils Absolute: 0 10*3/uL (ref 0.0–0.5)
Eosinophils Relative: 0 %
HCT: 30.2 % — ABNORMAL LOW (ref 39.0–52.0)
Hemoglobin: 9.2 g/dL — ABNORMAL LOW (ref 13.0–17.0)
Immature Granulocytes: 1 %
Lymphocytes Relative: 21 %
Lymphs Abs: 1.7 10*3/uL (ref 0.7–4.0)
MCH: 29.1 pg (ref 26.0–34.0)
MCHC: 30.5 g/dL (ref 30.0–36.0)
MCV: 95.6 fL (ref 80.0–100.0)
Monocytes Absolute: 0.6 10*3/uL (ref 0.1–1.0)
Monocytes Relative: 7 %
Neutro Abs: 6 10*3/uL (ref 1.7–7.7)
Neutrophils Relative %: 71 %
Platelets: 264 10*3/uL (ref 150–400)
RBC: 3.16 MIL/uL — ABNORMAL LOW (ref 4.22–5.81)
RDW: 13.5 % (ref 11.5–15.5)
WBC: 8.4 10*3/uL (ref 4.0–10.5)
nRBC: 0.4 % — ABNORMAL HIGH (ref 0.0–0.2)

## 2019-02-25 LAB — COMPREHENSIVE METABOLIC PANEL
ALT: 38 U/L (ref 0–44)
AST: 22 U/L (ref 15–41)
Albumin: 3.2 g/dL — ABNORMAL LOW (ref 3.5–5.0)
Alkaline Phosphatase: 47 U/L (ref 38–126)
Anion gap: 6 (ref 5–15)
BUN: 26 mg/dL — ABNORMAL HIGH (ref 8–23)
CO2: 30 mmol/L (ref 22–32)
Calcium: 8.5 mg/dL — ABNORMAL LOW (ref 8.9–10.3)
Chloride: 98 mmol/L (ref 98–111)
Creatinine, Ser: 1.17 mg/dL (ref 0.61–1.24)
GFR calc Af Amer: >60 mL/min (ref >60)
GFR calc non Af Amer: >60 mL/min (ref >60)
Glucose, Bld: 204 mg/dL — ABNORMAL HIGH (ref 70–99)
Potassium: 4.4 mmol/L (ref 3.5–5.1)
Sodium: 134 mmol/L — ABNORMAL LOW (ref 135–145)
Total Bilirubin: 0.3 mg/dL (ref 0.3–1.2)
Total Protein: 8.7 g/dL — ABNORMAL HIGH (ref 6.5–8.1)

## 2019-02-25 LAB — GLUCOSE, CAPILLARY
Glucose-Capillary: 252 mg/dL — ABNORMAL HIGH (ref 70–99)
Glucose-Capillary: 184 mg/dL — ABNORMAL HIGH (ref 70–99)
Glucose-Capillary: 190 mg/dL — ABNORMAL HIGH (ref 70–99)
Glucose-Capillary: 253 mg/dL — ABNORMAL HIGH (ref 70–99)

## 2019-02-25 LAB — C-REACTIVE PROTEIN: CRP: 0.6 mg/dL (ref <1.0)

## 2019-02-25 LAB — CULTURE, BLOOD (ROUTINE X 2)
Culture: NO GROWTH
Culture: NO GROWTH
Special Requests: ADEQUATE
Special Requests: ADEQUATE

## 2019-02-25 LAB — D-DIMER, QUANTITATIVE: D-Dimer, Quant: 0.61 ug/mL-FEU — ABNORMAL HIGH (ref 0.00–0.50)

## 2019-02-25 LAB — PHOSPHORUS: Phosphorus: 2.6 mg/dL (ref 2.5–4.6)

## 2019-02-25 LAB — FERRITIN: Ferritin: 89 ng/mL (ref 24–336)

## 2019-02-25 LAB — MAGNESIUM: Magnesium: 2.7 mg/dL — ABNORMAL HIGH (ref 1.7–2.4)

## 2019-02-25 MED ORDER — SENNOSIDES-DOCUSATE SODIUM 8.6-50 MG PO TABS: 2.0000 | ORAL_TABLET | Freq: Every day | ORAL | 5 refills | Status: DC

## 2019-02-25 MED ORDER — BACLOFEN 10 MG PO TABS: 10.0000 mg | ORAL_TABLET | Freq: Three times a day (TID) | ORAL | 0 refills | Status: DC | PRN

## 2019-02-25 NOTE — Progress Notes (Signed)
Patient Demographics:    Cory Weber, is a 69 y.o. male, DOB - 1950/04/14, KGU:[id number]  Admit date - 02/20/2019   Admitting Physician Murlean Iba, MD  Outpatient Primary MD for the patient is Iona Beard, MD  LOS - 5   Chief Complaint  Patient presents with  . Shortness of Breath        Subjective:    Cory Weber today has no fevers, no emesis,  No chest pain,    -No new concerns, patient remains stable, okay to discharge to [facility] :    Principal Problem:   Pneumonia due to COVID-19 virus Active Problems:   COVID-19 virus detected   Polysubstance abuse ([place])   Tobacco abuse   S/P lumbar spinal fusion   Chronic kidney disease, stage 3   Essential hypertension   Chronic hepatitis C without hepatic coma ([place])   History of CVA (cerebrovascular accident) without residual deficits   Anemia of chronic disease   BPH (benign prostatic hyperplasia)   GERD (gastroesophageal reflux disease)   Dyslipidemia associated with type 2 diabetes mellitus (HCC)   Chronic gout due to renal impairment involving toe of right foot without tophus   Physical deconditioning   Chronic diastolic CHF (congestive heart failure) (HCC)   S/P transmetatarsal amputation of foot, right (HCC)   PAD (peripheral artery disease) (HCC)   Acute respiratory failure with hypoxia ([place])   UTI (urinary tract infection)   DNR (do not resuscitate)   Brief Narrative: As per H&P by Dr. Wynetta Emery on 02/20/19 68 y.o.malewitha complexmedical history significantfor diastolic CHF, BPH, stage III CKD, type 2 diabetes mellitus, cerebrovascular disease status post CVA, chronic hepatitis C, history of substance abuse and alcohol, GERD who is a longtime resident of the [facility]. He reports that he was diagnosed with COVID-19 infection several days ago at the facility. He reports that he has continued  to have increasing symptoms of shortness of breath and fatigue and weakness. He has had a persistent dry cough chills no fever and nasal congestion. He denies having chest pain. Orts urinary frequency. He denies nausea vomiting. He was sent to the emergency department due to complaints of increasing shortness of breath. He was noted to be hypoxic at the facility with a pulse ox in the 86% range on room air.  ED Course:Hypoxic on arrival 86% on room air placed on 2 L supplemental oxygen with improvement into the low 90s. Patient symptoms improved after supplemental oxygen. Temp 98.7, pulse 80, blood pressure 132/76, blood sugar 164, triglycerides 127, ferritin 49, CRP 1.2, procalcitonin less than 0.10, WBC 6.3, hemoglobin 9.2 D-dimer 1.39, fibrinogen 542, urinalysis with large leukocytes and nitrite positive and greater than 50 WBC per high-power field, SARS 2 coronavirus test positive.  Patient was discharged to [facility] on 08/08/3149, however SNF was unable to pick patient up on Tuesday, 02/24/2018  Assessment & Plan:  1-acute respiratory failure with hypoxia ([place]) -In the setting of COVID-19 pneumonia -Patient completed IV steroids and IV remdesivir -Hypoxia resolved  -Treated with vitamin C and zinc -Treated with bronchodilators -Overall much improved  2-suspicion of chronic kidney disease, stage 3 -Renal function currently pretty good with a creatinine of 1.1, which appears to  be baseline -In retrospect no evidence for CKD 3 at this time (previously mentioned diagnosis of CKD stage III currently not confirmed)  3-Essential hypertension -Stable, continue amlodipine 5 mg daily, Coreg 3.125 mg twice daily, reduce lisinopril to 10 mg daily  4-Chronic hepatitis C without hepatic coma ([place]) -Continue outpatient follow-up with gastroenterology service - LFTs within normal limits.  5-History of CVA (cerebrovascular accident) without residual deficits -No acute neurologic  deficits -Continue secondary prevention -Continue simvastatin, aspirin and Plavix.  6-EnterobacterUTI -Culture results noted, clinically much improved, okay to complete p.o. Cipro as ordered   7)Gastroesophageal reflux disease/GI protection -Omeprazole/PPI recommended  8-type 2 diabetes mellitus with nephropathy --A1c 6.8 on 02/20/2019, -Discharged on Lantus insulin 26 units daily along with sliding scale coverage  9-anemia in the setting of chronic kidney disease -Hemoglobin around 9 at baseline -No signs of overt bleed  10-peripheral arterial disease and bilateral amputation -Continue simvastatin, aspirin and Plavix -Bilateral stumps are without signs of acute ulcers or infection.   11-DNR -Patient with no issues to be a DO NOT RESUSCITATE prior to admission -Will respect and honor his wishes  -Patient was discharged to [facility] on 02/12/5850, however SNF was unable to pick patient up on Tuesday, 02/24/2018   DVT prophylaxis:Lovenox Code Status:DNR Family Communication:No family at bedside. Disposition--patient has completed treatment for COVID-19 respiratory infection with remdesivir and steroids, hypoxia has resolved, okay to discharge back to [facility] Discharge Condition: Stable Follow UP--PCP as advised   Code Status : DNR  Family Communication:   NA (patient is alert, awake and coherent)   Disposition Plan  : Patient was discharged to [facility] on 7/78/2423, however SNF was unable to pick patient up on Tuesday, 02/24/2018  Consults  :  Na  DVT Prophylaxis  : Lovenox  Lab Results  Component Value Date   PLT 264 02/25/2019    Inpatient Medications  Scheduled Meds: . vitamin C  500 mg Oral Daily  . aspirin EC  81 mg Oral Daily  . atorvastatin  10 mg Oral q1800  . carvedilol  3.125 mg Oral BID WC  . cholecalciferol  50 mcg Oral Daily  . ciprofloxacin  250 mg Oral BID  . clopidogrel  75 mg Oral Q breakfast  . dexamethasone  6 mg Oral  Q24H  . enoxaparin (LOVENOX) injection  60 mg Subcutaneous Q24H  . gabapentin  600 mg Oral TID  . insulin aspart  0-20 Units Subcutaneous TID WC  . insulin aspart  0-5 Units Subcutaneous QHS  . insulin aspart  10 Units Subcutaneous TID WC  . insulin glargine  30 Units Subcutaneous QHS  . magnesium hydroxide  30 mL Oral Daily  . pantoprazole  40 mg Oral Daily  . polyethylene glycol  17 g Oral BID  . senna-docusate  1 tablet Oral BID  . tamsulosin  0.4 mg Oral Daily  . traZODone  50 mg Oral QHS  . cyanocobalamin  1,000 mcg Oral Daily  . zinc sulfate  220 mg Oral Daily   Continuous Infusions: PRN Meds:.acetaminophen, albuterol, baclofen, chlorpheniramine-HYDROcodone, guaiFENesin-dextromethorphan, ondansetron **OR** ondansetron (ZOFRAN) IV, oxyCODONE    Anti-infectives (From admission, onward)   Start     Dose/Rate Route Frequency Ordered Stop   02/24/19 0000  ciprofloxacin (CIPRO) 250 MG tablet     250 mg Oral 2 times daily 02/24/19 1346 02/27/19 2359   02/23/19 1300  ciprofloxacin (CIPRO) tablet 250 mg  Status:  Discontinued     250 mg Oral 2 times  daily 02/23/19 1224 02/23/19 1227   02/23/19 1300  ciprofloxacin (CIPRO) tablet 250 mg     250 mg Oral 2 times daily 02/23/19 1227 02/28/19 0759   02/21/19 1000  remdesivir 100 mg in sodium chloride 0.9 % 100 mL IVPB     100 mg 200 mL/hr over 30 Minutes Intravenous Daily 02/20/19 1330 02/24/19 1059   02/20/19 2130  cefTRIAXone (ROCEPHIN) 1 g in sodium chloride 0.9 % 100 mL IVPB     1 g 200 mL/hr over 30 Minutes Intravenous Every 24 hours 02/20/19 2120 02/22/19 2040   02/20/19 1400  remdesivir 200 mg in sodium chloride 0.9% 250 mL IVPB     200 mg 580 mL/hr over 30 Minutes Intravenous Once 02/20/19 1330 02/20/19 1759        Objective:   Vitals:   02/24/19 1431 02/24/19 2117 02/25/19 0606 02/25/19 0845  BP: (!) 156/70 (!) 164/80 135/70   Pulse: 61 64 (!) 57 63  Resp: 16 20 20    Temp: 98 F (36.7 C) 98.3 F (36.8 C) 98 F (36.7  C)   TempSrc: Oral Oral Oral   SpO2: 93% 92% 94%   Weight:      Height:        Wt Readings from Last 3 Encounters:  02/20/19 119.5 kg  10/15/18 118.3 kg  09/22/18 116.8 kg     Intake/Output Summary (Last 24 hours) at 02/25/2019 1316 Last data filed at 02/25/2019 1100 Gross per 24 hour  Intake 960 ml  Output 2875 ml  Net -1915 ml  Physical Exam  Gen:- Awake Alert,  -No new concerns, patient remains stable, okay to discharge to [place]:- [place].AT, No sclera icterus Neck-Supple Neck,No JVD,.  Lungs-  CTAB , fair symmetrical air movement CV- S1, S2 normal, regular  Abd-  +ve B.Sounds, Abd Soft, No tenderness,    Extremity/Skin:- No  edema, femoral pulses present  Psych-affect is appropriate, oriented x3 Neuro-no new focal deficits, no tremors MSK-lateral BKA stump healed   Data Review:   Micro Results Recent Results (from the past 240 hour(s))  Blood Culture (routine x 2)     Status: None   Collection Time: 02/20/19 12:01 PM   Specimen: BLOOD LEFT HAND  Result Value Ref Range Status   Specimen Description   Final    BLOOD LEFT HAND BOTTLES DRAWN AEROBIC AND ANAEROBIC   Special Requests Blood Culture adequate volume  Final   Culture   Final    NO GROWTH 5 DAYS Performed at [facility], [address] [place], [place] [address]    Report Status 02/25/2019 FINAL  Final  Blood Culture (routine x 2)     Status: None   Collection Time: 02/20/19 12:05 PM   Specimen: BLOOD RIGHT HAND  Result Value Ref Range Status   Specimen Description   Final    BLOOD RIGHT HAND BOTTLES DRAWN AEROBIC AND ANAEROBIC   Special Requests Blood Culture adequate volume  Final   Culture   Final    NO GROWTH 5 DAYS Performed at [facility], [address], [place], [place] [address]    Report Status 02/25/2019 FINAL  Final  Urine culture     Status: Abnormal   Collection Time: 02/20/19  1:48 PM   Specimen: Urine, Clean Catch  Result Value Ref Range Status   Specimen Description    Final    URINE, CLEAN CATCH Performed at [facility], [address] [place], [place] [address]  Special Requests   Final    NONE Performed at [facility], [address], [place], [place] [address]    Culture >=100,000 COLONIES/mL ENTEROBACTER CLOACAE (A)  Final   Report Status 02/23/2019 FINAL  Final   Organism ID, Bacteria ENTEROBACTER CLOACAE (A)  Final      Susceptibility   Enterobacter cloacae - MIC*    CEFAZOLIN >=64 RESISTANT Resistant     CEFTRIAXONE 32 RESISTANT Resistant     CIPROFLOXACIN <=0.25 SENSITIVE Sensitive     GENTAMICIN <=1 SENSITIVE Sensitive     IMIPENEM 0.5 SENSITIVE Sensitive     NITROFURANTOIN 32 SENSITIVE Sensitive     TRIMETH/SULFA <=20 SENSITIVE Sensitive     PIP/TAZO <=4 SENSITIVE Sensitive     * >=100,000 COLONIES/mL ENTEROBACTER CLOACAE  MRSA PCR Screening     Status: None   Collection Time: 02/22/19  1:32 PM   Specimen: Nasal Mucosa; Nasopharyngeal  Result Value Ref Range Status   MRSA by PCR NEGATIVE NEGATIVE Final    Comment:        The GeneXpert MRSA Assay (FDA approved for NASAL specimens only), is one component of a comprehensive MRSA colonization surveillance program. It is not intended to diagnose MRSA infection nor to guide or monitor treatment for MRSA infections. Performed at [facility], [address] [place], [place] [address]     Radiology Reports Portable chest 1 View  Result Date: 02/21/2019 CLINICAL DATA:  Shortness of breath. COVID-19 positive. EXAM: PORTABLE CHEST 1 VIEW COMPARISON:  02/20/2019 FINDINGS: The cardiomediastinal silhouette is unchanged with upper limits of normal heart size. There is new subtle patchy opacity in the right mid lung. The lungs are otherwise clear. No pleural effusion or pneumothorax is identified. IMPRESSION: New mild right midlung opacity which may reflect early pneumonia. Electronically Signed   By: Logan Bores M.D.   On: 02/21/2019 07:59   DG Chest Port 1 View  Result Date:  02/20/2019 CLINICAL DATA:  Shortness of breath.  COVID-19 positive EXAM: PORTABLE CHEST 1 VIEW COMPARISON:  August 24, 2016 FINDINGS: The lungs are clear. Heart is upper normal in size with pulmonary vascularity normal. No adenopathy. No bone lesions. A benign exostosis is noted along the inferior aspect of the distal right clavicle, stable. IMPRESSION: Lungs clear.  Stable cardiac silhouette.  No adenopathy. Electronically Signed   By: Lowella Grip III M.D.   On: 02/20/2019 11:40     CBC Recent Labs  Lab 02/21/19 0618 02/22/19 0543 02/23/19 0456 02/24/19 0633 02/25/19 0620  WBC 5.3 5.8 6.6 7.6 8.4  HGB 8.6* 8.5* 8.7* 8.9* 9.2*  HCT 28.3* 28.3* 29.2* 29.8* 30.2*  PLT 260 255 258 262 264  MCV 97.6 97.9 98.0 96.4 95.6  MCH 29.7 29.4 29.2 28.8 29.1  MCHC 30.4 30.0 29.8* 29.9* 30.5  RDW 13.6 13.7 13.7 13.5 13.5  LYMPHSABS 1.2 1.3 1.0 1.7 1.7  MONOABS 0.4 0.4 0.3 0.5 0.6  EOSABS 0.0 0.0 0.0 0.0 0.0  BASOSABS 0.0 0.0 0.0 0.0 0.0    Chemistries  Recent Labs  Lab 02/21/19 0618 02/22/19 0543 02/23/19 0456 02/24/19 0633 02/25/19 0620  NA 136 136 133* 135 134*  K 4.8 4.8 5.0 4.2 4.4  CL 98 98 98 98 98  CO2 29 30 30 31 30   GLUCOSE 222* 223* 305* 221* 204*  BUN 22 25* 27* 24* 26*  CREATININE 1.10 1.15 1.27* 1.10 1.17  CALCIUM 8.9 8.9 8.4* 8.7* 8.5*  MG 2.1 2.3 2.3 2.4 2.7*  AST 13*  12* 24 20 22   ALT 12 14 32 33 38  ALKPHOS 47 46 51 48 47  BILITOT 0.4 0.2* 0.2* 0.4 0.3   ------------------------------------------------------------------------------------------------------------------ No results for input(s): CHOL, HDL, LDLCALC, TRIG, CHOLHDL, LDLDIRECT in the last 72 hours.  Lab Results  Component Value Date   HGBA1C 6.8 (H) 02/20/2019   ------------------------------------------------------------------------------------------------------------------ No results for input(s): TSH, T4TOTAL, T3FREE, THYROIDAB in the last 72 hours.  Invalid input(s):  FREET3 ------------------------------------------------------------------------------------------------------------------ Recent Labs    02/24/19 0633 02/25/19 0620  FERRITIN 105 89    Coagulation profile No results for input(s): INR, PROTIME in the last 168 hours.  Recent Labs    02/24/19 0633 02/25/19 0620  DDIMER 0.54* 0.61*    Cardiac Enzymes No results for input(s): CKMB, TROPONINI, MYOGLOBIN in the last 168 hours.  Invalid input(s): CK ------------------------------------------------------------------------------------------------------------------    Component Value Date/Time   BNP 52.4 12/22/2015 1551     Damoni Causby M.D on 02/25/2019 at 1:16 PM  Go to [email or web address] - for contact info  [facility] - Office  [phone]

## 2019-02-25 NOTE — Progress Notes (Signed)
Inpatient Diabetes Program Recommendations  AACE/ADA: New Consensus Statement on Inpatient Glycemic Control   Target Ranges:  Prepandial:   less than 140 mg/dL      Peak postprandial:   less than 180 mg/dL (1-2 hours)      Critically ill patients:  140 - 180 mg/dL  Results for ZAXTON, ANGERER (MRN [id number]) as of 02/25/2019 13:09  Ref. Range 02/24/2019 07:49 02/24/2019 11:50 02/24/2019 17:09 02/24/2019 21:20 02/25/2019 03:02 02/25/2019 07:32 02/25/2019 11:34  Glucose-Capillary Latest Ref Range: 70 - 99 mg/dL 204 (H) 177 (H) 184 (H) 332 (H) 252 (H) 184 (H) 190 (H)   Results for JAFAR, POFFENBERGER (MRN [id number]) as of 02/25/2019 13:09  Ref. Range 02/20/2019 12:02  Hemoglobin A1C Latest Ref Range: 4.8 - 5.6 % 6.8 (H)   Review of Glycemic Control  Diabetes history:  Outpatient Diabetes medications: Lantus 24 units, Novolog 2-8 units tid + Novolog 5 units tid meal coverage, Metformin 500 mg bid Current orders for Inpatient glycemic control:Lantus 30 units QHS, Novolog 0-20 units TID with meals, Novolog 0-5 units QHS, Novolog 10 units TID with meals; Decadron 6 mg Q24H  Inpatient Diabetes Program Recommendations:    Insulin-Meal Coverage: If steroids are continued, please consider increasing meal coverage to Novolog 12 units TID with meals.  Thanks, Barnie Alderman, RN, MSN, CDE Diabetes Coordinator Inpatient Diabetes Program [phone] (Team Pager from 8am to 5pm)

## 2019-03-01 DIAGNOSIS — U071 COVID-19: Secondary | ICD-10-CM | POA: Diagnosis not present

## 2019-03-01 DIAGNOSIS — N390 Urinary tract infection, site not specified: Secondary | ICD-10-CM | POA: Diagnosis not present

## 2019-03-01 DIAGNOSIS — J1282 Pneumonia due to coronavirus disease 2019: Secondary | ICD-10-CM | POA: Diagnosis not present

## 2019-03-02 DIAGNOSIS — J1282 Pneumonia due to coronavirus disease 2019: Secondary | ICD-10-CM | POA: Diagnosis not present

## 2019-03-02 DIAGNOSIS — U071 COVID-19: Secondary | ICD-10-CM | POA: Diagnosis not present

## 2019-03-09 DIAGNOSIS — U071 COVID-19: Secondary | ICD-10-CM | POA: Diagnosis not present

## 2019-03-15 DIAGNOSIS — R05 Cough: Secondary | ICD-10-CM | POA: Diagnosis not present

## 2019-03-15 DIAGNOSIS — N390 Urinary tract infection, site not specified: Secondary | ICD-10-CM | POA: Diagnosis not present

## 2019-03-15 DIAGNOSIS — U071 COVID-19: Secondary | ICD-10-CM | POA: Diagnosis not present

## 2019-03-15 DIAGNOSIS — F329 Major depressive disorder, single episode, unspecified: Secondary | ICD-10-CM | POA: Diagnosis not present

## 2019-03-15 DIAGNOSIS — J1282 Pneumonia due to coronavirus disease 2019: Secondary | ICD-10-CM | POA: Diagnosis not present

## 2019-03-15 DIAGNOSIS — D509 Iron deficiency anemia, unspecified: Secondary | ICD-10-CM | POA: Diagnosis not present

## 2019-03-16 DIAGNOSIS — Z23 Encounter for immunization: Secondary | ICD-10-CM | POA: Diagnosis not present

## 2019-03-24 DIAGNOSIS — D649 Anemia, unspecified: Secondary | ICD-10-CM | POA: Diagnosis not present

## 2019-03-24 DIAGNOSIS — E785 Hyperlipidemia, unspecified: Secondary | ICD-10-CM | POA: Diagnosis not present

## 2019-03-24 DIAGNOSIS — R7989 Other specified abnormal findings of blood chemistry: Secondary | ICD-10-CM | POA: Diagnosis not present

## 2019-03-24 DIAGNOSIS — E559 Vitamin D deficiency, unspecified: Secondary | ICD-10-CM | POA: Diagnosis not present

## 2019-03-24 DIAGNOSIS — E119 Type 2 diabetes mellitus without complications: Secondary | ICD-10-CM | POA: Diagnosis not present

## 2019-03-24 DIAGNOSIS — D519 Vitamin B12 deficiency anemia, unspecified: Secondary | ICD-10-CM | POA: Diagnosis not present

## 2019-03-29 DIAGNOSIS — U071 COVID-19: Secondary | ICD-10-CM | POA: Diagnosis not present

## 2019-03-29 DIAGNOSIS — D631 Anemia in chronic kidney disease: Secondary | ICD-10-CM | POA: Diagnosis not present

## 2019-03-29 DIAGNOSIS — J1282 Pneumonia due to coronavirus disease 2019: Secondary | ICD-10-CM | POA: Diagnosis not present

## 2019-03-29 DIAGNOSIS — D509 Iron deficiency anemia, unspecified: Secondary | ICD-10-CM | POA: Diagnosis not present

## 2019-03-29 DIAGNOSIS — N390 Urinary tract infection, site not specified: Secondary | ICD-10-CM | POA: Diagnosis not present

## 2019-03-30 DIAGNOSIS — R319 Hematuria, unspecified: Secondary | ICD-10-CM | POA: Diagnosis not present

## 2019-03-30 DIAGNOSIS — N390 Urinary tract infection, site not specified: Secondary | ICD-10-CM | POA: Diagnosis not present

## 2019-03-31 DIAGNOSIS — R7989 Other specified abnormal findings of blood chemistry: Secondary | ICD-10-CM | POA: Diagnosis not present

## 2019-03-31 DIAGNOSIS — D649 Anemia, unspecified: Secondary | ICD-10-CM | POA: Diagnosis not present

## 2019-03-31 DIAGNOSIS — N390 Urinary tract infection, site not specified: Secondary | ICD-10-CM | POA: Diagnosis not present

## 2019-03-31 DIAGNOSIS — D509 Iron deficiency anemia, unspecified: Secondary | ICD-10-CM | POA: Diagnosis not present

## 2019-04-02 DIAGNOSIS — R7989 Other specified abnormal findings of blood chemistry: Secondary | ICD-10-CM | POA: Diagnosis not present

## 2019-04-08 DIAGNOSIS — D631 Anemia in chronic kidney disease: Secondary | ICD-10-CM | POA: Diagnosis not present

## 2019-04-19 DIAGNOSIS — D631 Anemia in chronic kidney disease: Secondary | ICD-10-CM | POA: Diagnosis not present

## 2019-04-20 DIAGNOSIS — I10 Essential (primary) hypertension: Secondary | ICD-10-CM | POA: Diagnosis not present

## 2019-04-20 DIAGNOSIS — E785 Hyperlipidemia, unspecified: Secondary | ICD-10-CM | POA: Diagnosis not present

## 2019-04-20 DIAGNOSIS — I2510 Atherosclerotic heart disease of native coronary artery without angina pectoris: Secondary | ICD-10-CM | POA: Diagnosis not present

## 2019-05-11 DIAGNOSIS — E1165 Type 2 diabetes mellitus with hyperglycemia: Secondary | ICD-10-CM | POA: Diagnosis not present

## 2019-05-11 DIAGNOSIS — E1122 Type 2 diabetes mellitus with diabetic chronic kidney disease: Secondary | ICD-10-CM | POA: Diagnosis not present

## 2019-05-14 DIAGNOSIS — E1122 Type 2 diabetes mellitus with diabetic chronic kidney disease: Secondary | ICD-10-CM | POA: Diagnosis not present

## 2019-05-14 DIAGNOSIS — I10 Essential (primary) hypertension: Secondary | ICD-10-CM | POA: Diagnosis not present

## 2019-05-14 DIAGNOSIS — I130 Hypertensive heart and chronic kidney disease with heart failure and stage 1 through stage 4 chronic kidney disease, or unspecified chronic kidney disease: Secondary | ICD-10-CM | POA: Diagnosis not present

## 2019-05-14 DIAGNOSIS — I2510 Atherosclerotic heart disease of native coronary artery without angina pectoris: Secondary | ICD-10-CM | POA: Diagnosis not present

## 2019-05-22 DIAGNOSIS — R300 Dysuria: Secondary | ICD-10-CM | POA: Diagnosis not present

## 2019-05-22 DIAGNOSIS — R319 Hematuria, unspecified: Secondary | ICD-10-CM | POA: Diagnosis not present

## 2019-05-22 DIAGNOSIS — N390 Urinary tract infection, site not specified: Secondary | ICD-10-CM | POA: Diagnosis not present

## 2019-05-25 DIAGNOSIS — E1122 Type 2 diabetes mellitus with diabetic chronic kidney disease: Secondary | ICD-10-CM | POA: Diagnosis not present

## 2019-05-25 DIAGNOSIS — E1165 Type 2 diabetes mellitus with hyperglycemia: Secondary | ICD-10-CM | POA: Diagnosis not present

## 2019-05-25 DIAGNOSIS — R4689 Other symptoms and signs involving appearance and behavior: Secondary | ICD-10-CM | POA: Diagnosis not present

## 2019-06-07 DIAGNOSIS — E0821 Diabetes mellitus due to underlying condition with diabetic nephropathy: Secondary | ICD-10-CM | POA: Diagnosis not present

## 2019-06-07 DIAGNOSIS — E119 Type 2 diabetes mellitus without complications: Secondary | ICD-10-CM | POA: Diagnosis not present

## 2019-06-07 DIAGNOSIS — D649 Anemia, unspecified: Secondary | ICD-10-CM | POA: Diagnosis not present

## 2019-06-11 DIAGNOSIS — E1122 Type 2 diabetes mellitus with diabetic chronic kidney disease: Secondary | ICD-10-CM | POA: Diagnosis not present

## 2019-06-11 DIAGNOSIS — N182 Chronic kidney disease, stage 2 (mild): Secondary | ICD-10-CM | POA: Diagnosis not present

## 2019-06-11 DIAGNOSIS — Z794 Long term (current) use of insulin: Secondary | ICD-10-CM | POA: Diagnosis not present

## 2019-06-11 DIAGNOSIS — Z79899 Other long term (current) drug therapy: Secondary | ICD-10-CM | POA: Diagnosis not present

## 2019-08-31 DIAGNOSIS — I10 Essential (primary) hypertension: Secondary | ICD-10-CM | POA: Diagnosis not present

## 2019-08-31 DIAGNOSIS — D649 Anemia, unspecified: Secondary | ICD-10-CM | POA: Diagnosis not present

## 2019-10-21 ENCOUNTER — Emergency Department (HOSPITAL_COMMUNITY)
Admission: EM | Admit: 2019-10-21 | Discharge: 2019-10-21 | Disposition: A | Discharge status: Skilled Nursing Facility | Payer: Medicare (Managed Care) | Attending: Emergency Medicine | Admitting: Emergency Medicine

## 2019-10-21 ENCOUNTER — Emergency Department (HOSPITAL_COMMUNITY): Payer: Medicare (Managed Care)

## 2019-10-21 ENCOUNTER — Encounter (HOSPITAL_COMMUNITY): Payer: Self-pay

## 2019-10-21 ENCOUNTER — Other Ambulatory Visit: Payer: Self-pay

## 2019-10-21 DIAGNOSIS — Z794 Long term (current) use of insulin: Secondary | ICD-10-CM | POA: Diagnosis not present

## 2019-10-21 DIAGNOSIS — I130 Hypertensive heart and chronic kidney disease with heart failure and stage 1 through stage 4 chronic kidney disease, or unspecified chronic kidney disease: Secondary | ICD-10-CM | POA: Insufficient documentation

## 2019-10-21 DIAGNOSIS — Z79899 Other long term (current) drug therapy: Secondary | ICD-10-CM | POA: Diagnosis not present

## 2019-10-21 DIAGNOSIS — R05 Cough: Secondary | ICD-10-CM | POA: Insufficient documentation

## 2019-10-21 DIAGNOSIS — Z20822 Contact with and (suspected) exposure to covid-19: Secondary | ICD-10-CM | POA: Insufficient documentation

## 2019-10-21 DIAGNOSIS — R10819 Abdominal tenderness, unspecified site: Secondary | ICD-10-CM | POA: Diagnosis not present

## 2019-10-21 DIAGNOSIS — N183 Chronic kidney disease, stage 3 unspecified: Secondary | ICD-10-CM | POA: Insufficient documentation

## 2019-10-21 DIAGNOSIS — E1143 Type 2 diabetes mellitus with diabetic autonomic (poly)neuropathy: Secondary | ICD-10-CM | POA: Insufficient documentation

## 2019-10-21 DIAGNOSIS — R197 Diarrhea, unspecified: Secondary | ICD-10-CM | POA: Diagnosis not present

## 2019-10-21 DIAGNOSIS — J45909 Unspecified asthma, uncomplicated: Secondary | ICD-10-CM | POA: Diagnosis not present

## 2019-10-21 DIAGNOSIS — E669 Obesity, unspecified: Secondary | ICD-10-CM | POA: Insufficient documentation

## 2019-10-21 DIAGNOSIS — R140 Abdominal distension (gaseous): Secondary | ICD-10-CM | POA: Diagnosis not present

## 2019-10-21 DIAGNOSIS — Z7982 Long term (current) use of aspirin: Secondary | ICD-10-CM | POA: Insufficient documentation

## 2019-10-21 DIAGNOSIS — R0602 Shortness of breath: Secondary | ICD-10-CM | POA: Insufficient documentation

## 2019-10-21 DIAGNOSIS — E1169 Type 2 diabetes mellitus with other specified complication: Secondary | ICD-10-CM | POA: Insufficient documentation

## 2019-10-21 DIAGNOSIS — I5032 Chronic diastolic (congestive) heart failure: Secondary | ICD-10-CM | POA: Insufficient documentation

## 2019-10-21 DIAGNOSIS — Z87891 Personal history of nicotine dependence: Secondary | ICD-10-CM | POA: Diagnosis not present

## 2019-10-21 DIAGNOSIS — R531 Weakness: Secondary | ICD-10-CM | POA: Diagnosis present

## 2019-10-21 DIAGNOSIS — I10 Essential (primary) hypertension: Secondary | ICD-10-CM | POA: Diagnosis not present

## 2019-10-21 DIAGNOSIS — R5383 Other fatigue: Secondary | ICD-10-CM | POA: Insufficient documentation

## 2019-10-21 LAB — CBC WITH DIFFERENTIAL/PLATELET
Abs Immature Granulocytes: 0.03 10*3/uL (ref 0.00–0.07)
Basophils Absolute: 0 10*3/uL (ref 0.0–0.1)
Basophils Relative: 0 %
Eosinophils Absolute: 0.1 10*3/uL (ref 0.0–0.5)
Eosinophils Relative: 1 %
HCT: 31.5 % — ABNORMAL LOW (ref 39.0–52.0)
Hemoglobin: 9.1 g/dL — ABNORMAL LOW (ref 13.0–17.0)
Immature Granulocytes: 0 %
Lymphocytes Relative: 25 %
Lymphs Abs: 1.8 10*3/uL (ref 0.7–4.0)
MCH: 29.6 pg (ref 26.0–34.0)
MCHC: 28.9 g/dL — ABNORMAL LOW (ref 30.0–36.0)
MCV: 102.6 fL — ABNORMAL HIGH (ref 80.0–100.0)
Monocytes Absolute: 0.7 10*3/uL (ref 0.1–1.0)
Monocytes Relative: 9 %
Neutro Abs: 4.6 10*3/uL (ref 1.7–7.7)
Neutrophils Relative %: 65 %
Platelets: 223 10*3/uL (ref 150–400)
RBC: 3.07 MIL/uL — ABNORMAL LOW (ref 4.22–5.81)
RDW: 14.5 % (ref 11.5–15.5)
WBC: 7.1 10*3/uL (ref 4.0–10.5)
nRBC: 0 % (ref 0.0–0.2)

## 2019-10-21 LAB — COMPREHENSIVE METABOLIC PANEL
ALT: 12 U/L (ref 0–44)
AST: 28 U/L (ref 15–41)
Albumin: 3.7 g/dL (ref 3.5–5.0)
Alkaline Phosphatase: 42 U/L (ref 38–126)
Anion gap: 7 (ref 5–15)
BUN: 18 mg/dL (ref 8–23)
CO2: 34 mmol/L — ABNORMAL HIGH (ref 22–32)
Calcium: 9.1 mg/dL (ref 8.9–10.3)
Chloride: 99 mmol/L (ref 98–111)
Creatinine, Ser: 1.38 mg/dL — ABNORMAL HIGH (ref 0.61–1.24)
GFR calc Af Amer: >60 mL/min (ref >60)
GFR calc non Af Amer: 53 mL/min — ABNORMAL LOW (ref >60)
Glucose, Bld: 96 mg/dL (ref 70–99)
Potassium: 4.4 mmol/L (ref 3.5–5.1)
Sodium: 140 mmol/L (ref 135–145)
Total Bilirubin: 0.4 mg/dL (ref 0.3–1.2)
Total Protein: 9.5 g/dL — ABNORMAL HIGH (ref 6.5–8.1)

## 2019-10-21 LAB — SARS CORONAVIRUS 2 BY RT PCR (HOSPITAL ORDER, PERFORMED IN ~~LOC~~ HOSPITAL LAB): SARS Coronavirus 2: NEGATIVE

## 2019-10-21 LAB — LIPASE, BLOOD: Lipase: 25 U/L (ref 11–51)

## 2019-10-21 LAB — BRAIN NATRIURETIC PEPTIDE: B Natriuretic Peptide: 90 pg/mL (ref 0.0–100.0)

## 2019-10-21 MED ORDER — IOHEXOL 300 MG/ML  SOLN
100.0000 mL | Freq: Once | INTRAMUSCULAR | Status: AC | PRN
  Administered 2019-10-21: 100 mL via INTRAVENOUS

## 2019-10-21 NOTE — ED Notes (Signed)
O2 off at this time

## 2019-10-21 NOTE — ED Provider Notes (Signed)
[facility] EMERGENCY DEPARTMENT Provider Note   CSN: [id number] Arrival date & time: 10/21/19  1120     History Chief Complaint  Patient presents with  . Weakness    Cory Weber is a 68 y.o. male with PMHx HTN, HLD, Hep C, CVA, CKD stage III, CHF with EF 55-60%, Type II diabetes who presents to the ED today via EMS for generalized weakness. Per triage report pt with complaints of diarrhea and weakness x a couple of days. He had a COVID test yesterday which was negative. On arrival pt's O2 sats 91%; he has been placed on 2L [place]. He reports that he is normally on oxygen at his SNF however he does not know how much. Pt states his symptoms feel like when he had COVID in January. He also complains of abdominal pain as well as SOB. He is otherwise a poor historian and is not very aware of his past medical history.   Additional information provided by staff at [facility] at [place] - they report pt requested to be evaluated today for shortness of breath. He was found to by hypoxic at 84% on RA and placed on 2L [place]. They state he is not normally on oxygen.  They did have everyone tested for COVID yesterday and no positive tests returned at the entire facility.   The history is provided by the patient, medical records, the EMS personnel and the nursing home.       Past Medical History:  Diagnosis Date  . Anemia   . Asthma   . BPH (benign prostatic hyperplasia)   . Cellulitis and abscess of foot 07/2015  . Chronic diastolic heart failure ([place])    per Nursing facility  . Chronic kidney disease    CKD stage 3  . CVA (cerebral vascular accident) ([place]) 2015   denies residual on 08/15/2015  . GERD (gastroesophageal reflux disease)   . Hepatitis C    states he was diagnosed in 2007 or 2007 while living in [place], [place]  . Hepatitis C   . Hyperlipidemia   . Hypertension   . Ischemia of foot 05/31/2016  . Osteomyelitis ([place])    per Nursing Facility  . Peripheral neuropathy   .  Pneumonia X 1  . PVD (peripheral vascular disease) ([place])   . Type II diabetes mellitus ([place])   . Wet gangrene ([place]) 06/10/2016    Patient Active Problem List   Diagnosis Date Noted  . Pneumonia due to COVID-19 virus 02/24/2019  . Acute respiratory failure with hypoxia ([place]) 02/20/2019  . COVID-19 virus detected 02/20/2019  . UTI (urinary tract infection) 02/20/2019  . DNR (do not resuscitate) 02/20/2019  . PAD (peripheral artery disease) ([place]) 08/22/2016  . Osteomyelitis of foot ([place]) 07/08/2016  . S/P transmetatarsal amputation of foot, right ([place]) 06/26/2016  . Constipation due to opioid therapy 06/15/2016  . Physical deconditioning 06/10/2016  . Wet gangrene ([place]) 06/10/2016  . Chronic diastolic CHF (congestive heart failure) ([place]) 06/10/2016  . Ischemic pain of right foot 06/04/2016  . Ischemia of foot 05/31/2016  . 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 Dyslipidemia associated with type 2 diabetes mellitus ([place]) 01/21/2016  . Uncontrolled type II diabetes with peripheral autonomic neuropathy ([place]) 01/17/2016  . Weight gain 12/20/2015  . Edema of right lower extremity 12/20/2015  . Cough 11/26/2015  . GERD (gastroesophageal reflux disease) 10/08/2015  . Amputation of left lower extremity below knee ([place]) 08/21/2015  .  Phantom limb pain ([place])   . Anemia of chronic disease   . BPH (benign prostatic hyperplasia)   . ETOH abuse   . PVD (peripheral vascular disease) ([place])   . Chronic hepatitis C without hepatic coma ([place])   . History of CVA (cerebrovascular accident) without residual deficits   . Hyponatremia   . Chronic kidney disease, stage 3 07/18/2015  . Essential hypertension 07/18/2015  . S/P lumbar spinal fusion 11/25/2013  . Melena 12/03/2011  . Hemiparesthesia 02/03/2011  . Polysubstance abuse ([place]) 02/03/2011  . Tobacco abuse 02/03/2011    Past Surgical History:  Procedure Laterality Date  . ABDOMINAL  AORTOGRAM N/A 06/03/2016   Procedure: Abdominal Aortogram;  Surgeon: Waynetta Sandy, MD;  Location: [facility];  Service: Cardiovascular;  Laterality: N/A;  . AMPUTATION Left 08/16/2015   Procedure: LEFT BELOW THE KNEE AMPUTATION ;  Surgeon: Serafina Mitchell, MD;  Location: [facility];  Service: Vascular;  Laterality: Left;  . AMPUTATION Right 08/22/2016   Procedure: AMPUTATION BELOW KNEE-RIGHT;  Surgeon: Waynetta Sandy, MD;  Location: [facility];  Service: Vascular;  Laterality: Right;  . BACK SURGERY    . BELOW KNEE LEG AMPUTATION Left 08/16/2015  . BIOPSY N/A 09/03/2012   Procedure: BIOPSY;  Surgeon: Daneil Dolin, MD;  Location: [facility];  Service: Endoscopy;  Laterality: N/A;  gastric and gastric mucosa  . BIOPSY N/A 12/03/2012   Procedure: BIOPSY;  Surgeon: Daneil Dolin, MD;  Location: [facility];  Service: Endoscopy;  Laterality: N/A;  . COLONOSCOPY WITH PROPOFOL N/A 09/03/2012   WJX:BJYNWGN polyp-removed as outlined above. Prominent internal hemorrhoids. Tubular adenoma  . ESOPHAGOGASTRODUODENOSCOPY (EGD) WITH PROPOFOL N/A 09/03/2012   FAO:ZHYQMV hernia. Gastric diverticulum. Gastric ulcers with associated erosions. Duodenal erosions. Status post gastric biopsy. H.PYLORI gastritis   . ESOPHAGOGASTRODUODENOSCOPY (EGD) WITH PROPOFOL N/A 12/03/2012   Dr. Gala Romney: gastric diverticulum, gastric erosions and scar. Previously noted gastric ulcer completed healed. Biopsy without H.pylori.   . ESOPHAGOGASTRODUODENOSCOPY (EGD) WITH PROPOFOL N/A 01/23/2016   Procedure: ESOPHAGOGASTRODUODENOSCOPY (EGD) WITH PROPOFOL;  Surgeon: Doran Stabler, MD;  [facility]: [facility];  Service: Gastroenterology;  Laterality: N/A;  . I & D EXTREMITY Right 07/11/2016   Procedure: DELAY PRIMARY CLOSURE FOOT AMPUTATION;  Surgeon: Edrick Kins, DPM;  Location: [facility];  Service: Podiatry;  Laterality: Right;  . LIVER BIOPSY  2005   Done in [place], [place]. Chronic hepatitis with mild periportal inflammation,  lobular unicellular necrosis and portal fibrosis. Grade 2, stage 1-2.  . LOWER EXTREMITY ANGIOGRAPHY Right 06/03/2016   Procedure: Lower Extremity Angiography;  Surgeon: Waynetta Sandy, MD;  Location: [facility];  Service: Cardiovascular;  Laterality: Right;  . MAXIMUM ACCESS (MAS)POSTERIOR LUMBAR INTERBODY FUSION (PLIF) 1 LEVEL N/A 11/25/2013   Procedure: FOR MAXIMUM ACCESS (MAS) POSTERIOR LUMBAR INTERBODY FUSION (PLIF) 1 LEVEL;  Surgeon: Eustace Moore, MD;  Location: [facility];  Service: Neurosurgery;  Laterality: N/A;  FOR MAXIMUM ACCESS (MAS) POSTERIOR LUMBAR INTERBODY FUSION (PLIF) 1 LEVEL LUMBAR 3-4  . PERIPHERAL VASCULAR ATHERECTOMY Right 06/03/2016   Procedure: Peripheral Vascular Atherectomy;  Surgeon: Waynetta Sandy, MD;  Location: [facility];  Service: Cardiovascular;  Laterality: Right;  SFA WITH STENT  . PERIPHERAL VASCULAR CATHETERIZATION Left 08/01/2015   Procedure: Lower Extremity Angiography;  Surgeon: Serafina Mitchell, MD;  Location: [facility];  Service: Cardiovascular;  Laterality: Left;  . PERIPHERAL VASCULAR CATHETERIZATION N/A 08/01/2015   Procedure: Abdominal Aortogram;  Surgeon: Durene Fruits  Pierre Bali, MD;  Location: [facility];  Service: Cardiovascular;  Laterality: N/A;  . PERIPHERAL VASCULAR CATHETERIZATION N/A 08/08/2015   Procedure: Abdominal Aortogram w/Lower Extremity;  Surgeon: Serafina Mitchell, MD;  Location: [facility];  Service: Cardiovascular;  Laterality: N/A;  . PERIPHERAL VASCULAR CATHETERIZATION Left 08/08/2015   Procedure: Peripheral Vascular Balloon Angioplasty;  Surgeon: Serafina Mitchell, MD;  Location: [facility];  Service: Cardiovascular;  Laterality: Left;  left popiteal artery, left peronealtrunk, left post tibial  . POLYPECTOMY N/A 09/03/2012   Procedure: POLYPECTOMY;  Surgeon: Daneil Dolin, MD;  [facility];  Service: Endoscopy;  Laterality: N/A;  cecal polyp  . TONSILLECTOMY    .  TRANSMETATARSAL AMPUTATION Right 06/13/2016   Procedure: RIGHT TRANSMETATARSAL AMPUTATION;  Surgeon: Waynetta Sandy, MD;  Location: [facility];  Service: Vascular;  Laterality: Right;  . TRANSMETATARSAL AMPUTATION Right 07/10/2016   Procedure: REVISION RIGHT TRANSMETATARSAL AMPUTATION;  Surgeon: Waynetta Sandy, MD;  Location: [facility];  Service: Vascular;  Laterality: Right;       Family History  Problem Relation Age of Onset  . Breast cancer Mother   . Heart failure Mother   . Diabetes Father   . Hypertension Father   . Heart disease Father   . Hyperlipidemia Father   . Diabetes Sister   . Hypertension Sister   . Breast cancer Sister   . Colon cancer Neg Hx   . Liver disease Neg Hx     Social History   Tobacco Use  . Smoking status: Former Smoker    Packs/day: 1.00    Years: 47.00    Pack years: 47.00    Types: Cigarettes    Quit date: 07/13/2015    Years since quitting: 4.2  . Smokeless tobacco: Never Used  Vaping Use  . Vaping Use: Never used  Substance Use Topics  . Alcohol use: No    Alcohol/week: 0.0 standard drinks    Comment:  none 08/15/2015 "stopped drinking 3-4 years ago"  . Drug use: No    Comment: 08/15/2015 "last used cocaine 3-4 months ago"    Home Medications Prior to Admission medications   Medication Sig Start Date End Date Taking? Authorizing Provider  acetaminophen (TYLENOL) 325 MG tablet Take 2 tablets (650 mg total) by mouth every 6 (six) hours as needed for mild pain or headache (fever >/= 101). 02/24/19  Yes Roxan Hockey, MD  aspirin EC 81 MG tablet Take 1 tablet (81 mg total) by mouth daily with breakfast. 02/24/19  Yes Emokpae, Courage, MD  baclofen (LIORESAL) 10 MG tablet Take 1 tablet (10 mg total) by mouth 3 (three) times daily as needed for muscle spasms. Patient taking differently: Take 10 mg by mouth 2 (two) times daily. For shoulder spasm 02/25/19  Yes Emokpae, Courage, MD  Baclofen 5 MG TABS Take 15 mg by mouth daily. For  muscle spasms   Yes [provider]  carvedilol (COREG) 3.125 MG tablet Take 1 tablet (3.125 mg total) by mouth 2 (two) times daily with a meal. Patient taking differently: Take 6.25 mg by mouth 2 (two) times daily with a meal.  08/10/15  Yes Mikhail, Velta Addison, DO  clopidogrel (PLAVIX) 75 MG tablet Take 1 tablet (75 mg total) by mouth daily with breakfast. 06/05/16  Yes Dhungel, Nishant, MD  cyanocobalamin 1000 MCG tablet Take 500 mcg by mouth daily.    Yes [provider]  DULoxetine (CYMBALTA) 30 MG capsule Take 1 capsule by  mouth daily. 10/20/19  Yes [provider]  ferrous sulfate 325 (65 FE) MG tablet Take 325 mg by mouth 3 (three) times daily with meals.   Yes [provider]  insulin aspart (NOVOLOG) 100 UNIT/ML injection Inject 5 Units into the skin 3 (three) times daily after meals. Patient taking differently: Inject 8 Units into the skin 3 (three) times daily with meals.  07/15/16  Yes Domenic Polite, MD  insulin glargine (LANTUS) 100 UNIT/ML injection Inject 0.26 mLs (26 Units total) into the skin at bedtime. Patient taking differently: Inject 30 Units into the skin at bedtime.  02/24/19  Yes Emokpae, Courage, MD  lactulose (CHRONULAC) 10 GM/15ML solution Take 15 mLs by mouth 3 (three) times daily. 08/01/19  Yes [provider]  Lidocaine 4 % PTCH Apply 1 patch topically daily. Apply 1 patch to both stumps once daily   Yes [provider]  lisinopril (ZESTRIL) 10 MG tablet Take 1 tablet (10 mg total) by mouth daily. 02/24/19  Yes Emokpae, Courage, MD  Melatonin 5 MG TABS Take 1 tablet by mouth at bedtime.   Yes [provider]  metFORMIN (GLUCOPHAGE) 850 MG tablet Take 850 mg by mouth 2 (two) times daily. 10/14/19  Yes [provider]  pantoprazole (PROTONIX) 20 MG tablet Take 20 mg by mouth daily. 09/26/19  Yes [provider]  polyethylene glycol (MIRALAX / GLYCOLAX) 17 g packet Take 17 g by mouth daily.   Yes  [provider]  pregabalin (LYRICA) 100 MG capsule Take 1 capsule by mouth at bedtime. 10/20/19  Yes [provider]  pregabalin (LYRICA) 75 MG capsule Take 1 capsule by mouth 2 (two) times daily. 10/20/19  Yes [provider]  rosuvastatin (CRESTOR) 5 MG tablet Take 5 mg by mouth daily. 09/16/19  Yes [provider]  senna-docusate (SENOKOT-S) 8.6-50 MG tablet Take 2 tablets by mouth at bedtime. 02/25/19  Yes Emokpae, Courage, MD  tamsulosin (FLOMAX) 0.4 MG CAPS capsule Take 1 capsule (0.4 mg total) by mouth daily. 08/10/15  Yes Mikhail, Velta Addison, DO  torsemide (DEMADEX) 20 MG tablet Take 2 tablets (40 mg total) by mouth daily. 09/22/18 10/21/19 Yes Strader, Fransisco Hertz, PA-C  vitamin C (ASCORBIC ACID) 500 MG tablet Take 500 mg by mouth 2 (two) times daily.   Yes [provider]  amLODipine (NORVASC) 5 MG tablet Take 5 mg by mouth daily. Patient not taking: Reported on 10/21/2019    [provider]  Cholecalciferol (VITAMIN D3) 2000 units TABS Take 1 tablet by mouth daily. Patient not taking: Reported on 10/21/2019    [provider]  gabapentin (NEURONTIN) 600 MG tablet Take 1 tablet (600 mg total) by mouth 3 (three) times daily. Patient not taking: Reported on 10/21/2019 07/15/16   Domenic Polite, MD  omeprazole (PRILOSEC) 20 MG capsule Take 1 capsule (20 mg total) by mouth daily. Patient not taking: Reported on 10/21/2019 02/24/19 02/24/20  Roxan Hockey, MD  simvastatin (ZOCOR) 20 MG tablet Take 20 mg by mouth daily.  Patient not taking: Reported on 10/21/2019    [provider]  zinc sulfate 220 (50 Zn) MG capsule Take 1 capsule (220 mg total) by mouth daily. Patient not taking: Reported on 10/21/2019 02/25/19   Roxan Hockey, MD    Allergies    No known allergies  Review of Systems   Review of Systems  Constitutional: Positive for fatigue. Negative for chills and fever.  Respiratory: Positive for cough and shortness of breath.  Cardiovascular: Negative for chest pain.  Gastrointestinal: Positive for abdominal pain and diarrhea. Negative for nausea and vomiting.  All other systems reviewed and are negative.   Physical Exam Updated Vital Signs BP (!) 142/79 (BP Location: Left Arm)   Pulse 95   Temp 99.3 F (37.4 C) (Oral)   Resp 17   Ht 6\' 1"  (1.854 m)   Wt 123.4 kg   SpO2 91%   BMI 35.89 kg/m   Physical Exam Vitals and nursing note reviewed.  Constitutional:      Appearance: He is obese. He is not ill-appearing or diaphoretic.  HENT:     Head: Normocephalic and atraumatic.  Eyes:     Conjunctiva/sclera: Conjunctivae normal.  Cardiovascular:     Rate and Rhythm: Normal rate and regular rhythm.     Pulses: Normal pulses.  Pulmonary:     Effort: Pulmonary effort is normal.     Breath sounds: Rales present. No wheezing or rhonchi.     Comments: Able to speak in full sentences. Satting 100% on 2L [place].  Abdominal:     General: There is distension.     Palpations: Abdomen is soft.     Tenderness: There is abdominal tenderness. There is no guarding or rebound.  Musculoskeletal:     Cervical back: Neck supple.     Comments: Bilateral BKAs  Skin:    General: Skin is warm and dry.  Neurological:     Mental Status: He is alert.     ED Results / Procedures / Treatments   Labs (all labs ordered are listed, but only abnormal results are displayed) Labs Reviewed  COMPREHENSIVE METABOLIC PANEL - Abnormal; Notable for the following components:      Result Value   CO2 34 (*)    Creatinine, Ser 1.38 (*)    Total Protein 9.5 (*)    GFR calc non Af Amer 53 (*)    All other components within normal limits  CBC WITH DIFFERENTIAL/PLATELET - Abnormal; Notable for the following components:   RBC 3.07 (*)    Hemoglobin 9.1 (*)    HCT 31.5 (*)    MCV 102.6 (*)    MCHC 28.9 (*)    All other components within normal limits  SARS CORONAVIRUS 2 BY RT PCR (HOSPITAL ORDER, [place]  [facility])  LIPASE, BLOOD  BRAIN NATRIURETIC PEPTIDE  URINALYSIS, ROUTINE W REFLEX MICROSCOPIC    EKG None  Radiology CT Abdomen Pelvis W Contrast  Result Date: 10/21/2019 CLINICAL DATA:  Abdominal pain, diarrhea, weakness EXAM: CT ABDOMEN AND PELVIS WITH CONTRAST TECHNIQUE: Multidetector CT imaging of the abdomen and pelvis was performed using the standard protocol following bolus administration of intravenous contrast. CONTRAST:  164mL OMNIPAQUE IOHEXOL 300 MG/ML  SOLN COMPARISON:  2016 FINDINGS: Lower chest: Mild bibasilar atelectasis. Hepatobiliary: Probable [place] steatosis. Gallbladder is unremarkable. There is no biliary dilatation. Pancreas: Unremarkable. Spleen: Unremarkable. Adrenals/Urinary Tract: Adrenals, kidneys, and bladder are unremarkable. Stomach/Bowel: Small posterior directed diverticulum of the gastric fundus. Stomach is otherwise unremarkable. Bowel is normal in caliber. Normal appendix. Vascular/Lymphatic: Diffuse aortoiliac atherosclerosis. No enlarged lymph nodes. Reproductive: Unremarkable. Other: No ascites. No significant abnormality of the abdominal wall. Musculoskeletal: Lumbar fusion at L3-L4. No acute osseous abnormality. IMPRESSION: No acute abnormality or findings to account for reported symptoms. Electronically Signed   By: Macy Mis M.D.   On: 10/21/2019 13:04   DG Chest Port 1 View  Result Date: 10/21/2019 CLINICAL DATA:  Cough and congestion EXAM: PORTABLE  CHEST 1 VIEW COMPARISON:  02/21/2019 FINDINGS: Cardiac shadow is within normal limits. The lungs are well aerated bilaterally. No focal infiltrate or sizable effusion is seen. No bony abnormality is noted. IMPRESSION: No acute abnormality noted. Electronically Signed   By: Inez Catalina M.D.   On: 10/21/2019 12:31    Procedures Procedures (including critical care time)  Medications Ordered in ED Medications  iohexol (OMNIPAQUE) 300 MG/ML solution 100 mL (100 mLs Intravenous Contrast Given 10/21/19 1242)     ED Course  I have reviewed the triage vital signs and the nursing notes.  Pertinent labs & imaging results that were available during my care of the patient were reviewed by me and considered in my medical decision making (see chart for details).  Clinical Course as of Oct 20 1448  Thu Oct 21, 2019  1242 SARS Coronavirus 2: NEGATIVE [MV]    Clinical Course User Index [MV] Eustaquio Maize, PA-C   MDM Rules/Calculators/A&P                          69 year old male who presents to the ED today from SNF via EMS for generalized weakness, diarrhea, shortness of breath.  On arrival to the ED patient temp is 99.3.  He is nontachycardic and nontachypneic.  He is satting 100% on 2 L nasal cannula.  Patient states he is typically on oxygen however per staff patient is not normally on O2 and was found to be hypoxic at 84% on room air earlier today.  Patient reports he did have COVID-19 in January of this year and he is vaccinated however he states his symptoms feel similar.  On exam patient has diffuse rales throughout.  He is also noted to have abdominal tenderness palpation with distention.  He states his abdomen feels more distended than normal.  PCR Covid test was collected on arrival.  Will await results.  We will plan for lab work including CBC, CMP, lipase given abdominal pain and diarrhea.  We will plan for chest x-ray and CAT scan of his abdomen and pelvis.  Have added on BNP with rales throughout and history of congestive heart failure with abdominal distention.  Patient denies any chest pain.  Low suspicion for ACS at this time.  CXR clear CBC without leukocytosis. Hgb stable at 9.1.   Hemoglobin  Date Value Ref Range Status  10/21/2019 9.1 (L) 13.0 - 17.0 g/dL Final  02/25/2019 9.2 (L) 13.0 - 17.0 g/dL Final  02/24/2019 8.9 (L) 13.0 - 17.0 g/dL Final  02/23/2019 8.7 (L) 13.0 - 17.0 g/dL Final   CMP with stable creatinine 1.38. Potassium 4.4.   Lab Results  Component Value Date    CREATININE 1.38 (H) 10/21/2019   CREATININE 1.17 02/25/2019   CREATININE 1.10 02/24/2019   Lipase 25. COVID test negative BNP 90  CT scan without acute findings at this time.  Difficult to say what is causing pt's symptoms given his work up is essentially completely benign. Will try to wean off O2 and see how patient does; he continues to state he wears O2 at SNF and SNF denies this.   Pt unable to be weaned from O2. Upon further inspection with the paperwork that was brought from [facility] pt has an order for 2-4L O2 PRN to ensure O2 sats > 92%. Will relay this to [facility]. Pt to be discharged home at this time. Suspect his symptoms are likely related to feeling SOB due to not being  on oxygen appropriately at the facility. He may also have a GI bug with his diarrhea.   This note was prepared using Dragon voice recognition software and may include unintentional dictation errors due to the inherent limitations of voice recognition software.  Final Clinical Impression(s) / ED Diagnoses Final diagnoses:  Fatigue, unspecified type  Diarrhea, unspecified type    Rx / DC Orders ED Discharge Orders    None       Discharge Instructions     All of the lab work, imaging, and COVID test were reassuring today.   It does appear patient should be on oxygen as needed to ensure O2 sats > 92% per the paperwork provided by the facility. Please allow him to have oxygen as it does appear he needs it.   Return to the ED for any worsening symptoms.        Eustaquio Maize, PA-C 10/21/19 1450    Elnora Morrison, MD 10/25/19 [phone]

## 2019-10-21 NOTE — ED Notes (Signed)
Pt prepared for d/c to SNF. IV d/c'd. Skin intact except as charted in most recent assessments. Vitals are stable. Report called to receiving facility. Pt to be transported by ambulance service. 

## 2019-10-21 NOTE — ED Notes (Signed)
Attempted to give report to [place] x3 attempts

## 2019-10-21 NOTE — ED Triage Notes (Signed)
Pt presents to ED with complaints of diarrhea and weakness x couple days. Pt had covid test yesterday and was negative.

## 2019-10-21 NOTE — Discharge Instructions (Addendum)
All of the lab work, imaging, and COVID test were reassuring today.   It does appear patient should be on oxygen as needed to ensure O2 sats > 92% per the paperwork provided by the facility. Please allow him to have oxygen as it does appear he needs it.   Return to the ED for any worsening symptoms.

## 2019-10-23 ENCOUNTER — Other Ambulatory Visit: Payer: Self-pay

## 2019-10-23 ENCOUNTER — Emergency Department (HOSPITAL_COMMUNITY)
Admission: EM | Admit: 2019-10-23 | Discharge: 2019-10-23 | Disposition: A | Discharge status: Home / Self Care | Payer: Medicare (Managed Care) | Attending: Emergency Medicine | Admitting: Emergency Medicine

## 2019-10-23 ENCOUNTER — Encounter (HOSPITAL_COMMUNITY): Payer: Self-pay | Admitting: Emergency Medicine

## 2019-10-23 DIAGNOSIS — E119 Type 2 diabetes mellitus without complications: Secondary | ICD-10-CM | POA: Diagnosis not present

## 2019-10-23 DIAGNOSIS — Z794 Long term (current) use of insulin: Secondary | ICD-10-CM | POA: Diagnosis not present

## 2019-10-23 DIAGNOSIS — I130 Hypertensive heart and chronic kidney disease with heart failure and stage 1 through stage 4 chronic kidney disease, or unspecified chronic kidney disease: Secondary | ICD-10-CM | POA: Insufficient documentation

## 2019-10-23 DIAGNOSIS — Z87891 Personal history of nicotine dependence: Secondary | ICD-10-CM | POA: Insufficient documentation

## 2019-10-23 DIAGNOSIS — R197 Diarrhea, unspecified: Secondary | ICD-10-CM

## 2019-10-23 DIAGNOSIS — Z79899 Other long term (current) drug therapy: Secondary | ICD-10-CM | POA: Insufficient documentation

## 2019-10-23 DIAGNOSIS — Z7982 Long term (current) use of aspirin: Secondary | ICD-10-CM | POA: Insufficient documentation

## 2019-10-23 DIAGNOSIS — J45909 Unspecified asthma, uncomplicated: Secondary | ICD-10-CM | POA: Insufficient documentation

## 2019-10-23 DIAGNOSIS — I5032 Chronic diastolic (congestive) heart failure: Secondary | ICD-10-CM | POA: Insufficient documentation

## 2019-10-23 DIAGNOSIS — N183 Chronic kidney disease, stage 3 unspecified: Secondary | ICD-10-CM | POA: Diagnosis not present

## 2019-10-23 DIAGNOSIS — R29898 Other symptoms and signs involving the musculoskeletal system: Secondary | ICD-10-CM | POA: Diagnosis not present

## 2019-10-23 DIAGNOSIS — Z7401 Bed confinement status: Secondary | ICD-10-CM | POA: Diagnosis not present

## 2019-10-23 DIAGNOSIS — R069 Unspecified abnormalities of breathing: Secondary | ICD-10-CM | POA: Diagnosis not present

## 2019-10-23 LAB — CBC WITH DIFFERENTIAL/PLATELET
Abs Immature Granulocytes: 0.02 10*3/uL (ref 0.00–0.07)
Basophils Absolute: 0 10*3/uL (ref 0.0–0.1)
Basophils Relative: 0 %
Eosinophils Absolute: 0.1 10*3/uL (ref 0.0–0.5)
Eosinophils Relative: 1 %
HCT: 28.9 % — ABNORMAL LOW (ref 39.0–52.0)
Hemoglobin: 8.6 g/dL — ABNORMAL LOW (ref 13.0–17.0)
Immature Granulocytes: 0 %
Lymphocytes Relative: 28 %
Lymphs Abs: 2.2 10*3/uL (ref 0.7–4.0)
MCH: 30.6 pg (ref 26.0–34.0)
MCHC: 29.8 g/dL — ABNORMAL LOW (ref 30.0–36.0)
MCV: 102.8 fL — ABNORMAL HIGH (ref 80.0–100.0)
Monocytes Absolute: 0.6 10*3/uL (ref 0.1–1.0)
Monocytes Relative: 8 %
Neutro Abs: 5 10*3/uL (ref 1.7–7.7)
Neutrophils Relative %: 63 %
Platelets: 202 10*3/uL (ref 150–400)
RBC: 2.81 MIL/uL — ABNORMAL LOW (ref 4.22–5.81)
RDW: 14 % (ref 11.5–15.5)
WBC: 8 10*3/uL (ref 4.0–10.5)
nRBC: 0 % (ref 0.0–0.2)

## 2019-10-23 LAB — GASTROINTESTINAL PANEL BY PCR, STOOL (REPLACES STOOL CULTURE)

## 2019-10-23 LAB — BASIC METABOLIC PANEL
Anion gap: 9 (ref 5–15)
BUN: 24 mg/dL — ABNORMAL HIGH (ref 8–23)
CO2: 33 mmol/L — ABNORMAL HIGH (ref 22–32)
Calcium: 8.8 mg/dL — ABNORMAL LOW (ref 8.9–10.3)
Chloride: 95 mmol/L — ABNORMAL LOW (ref 98–111)
Creatinine, Ser: 2.11 mg/dL — ABNORMAL HIGH (ref 0.61–1.24)
GFR calc Af Amer: 37 mL/min — ABNORMAL LOW (ref >60)
GFR calc non Af Amer: 32 mL/min — ABNORMAL LOW (ref >60)
Glucose, Bld: 112 mg/dL — ABNORMAL HIGH (ref 70–99)
Potassium: 4.3 mmol/L (ref 3.5–5.1)
Sodium: 137 mmol/L (ref 135–145)

## 2019-10-23 MED ORDER — SODIUM CHLORIDE 0.9 % IV BOLUS
500.0000 mL | Freq: Once | INTRAVENOUS | Status: AC
  Administered 2019-10-23: 500 mL via INTRAVENOUS

## 2019-10-23 NOTE — ED Notes (Signed)
IV access attempt x 2 -no successMerry Proud RN will attempt US guided IV

## 2019-10-23 NOTE — Discharge Instructions (Addendum)
Stop taking lactulose as this is likely worsening your diarrhea.  You have stool cultures pending.  If one of these studies becomes abnormal, you will be notified.  Follow-up with your primary care provider in the next few days, and return to the ER if symptoms significantly worsen or change.

## 2019-10-23 NOTE — ED Provider Notes (Signed)
[facility] EMERGENCY DEPARTMENT Provider Note   CSN: [id number] Arrival date & time: 10/23/19  3762     History Chief Complaint  Patient presents with  . Diarrhea    KENETH Weber is a 69 y.o. male.  Patient is a 69 year old male with past medical history of chronic renal insufficiency, hypertension, diabetes, CHF, and status post bilateral below-the-knee amputations.  Patient sent here from [facility] for evaluation of diarrhea.  Patient has had loose stools for the past several days.  He was seen here 2 nights ago and had laboratory studies and CT scan obtained, all of which were unremarkable and he was discharged back to the nursing home.  Patient sent back here for ongoing symptoms.  He denies to me he is having any abdominal pain.  He denies any fevers or chills.  The history is provided by the patient.       Past Medical History:  Diagnosis Date  . Anemia   . Asthma   . BPH (benign prostatic hyperplasia)   . Cellulitis and abscess of foot 07/2015  . Chronic diastolic heart failure ([place])    per Nursing facility  . Chronic kidney disease    CKD stage 3  . CVA (cerebral vascular accident) ([place]) 2015   denies residual on 08/15/2015  . GERD (gastroesophageal reflux disease)   . Hepatitis C    states he was diagnosed in 2007 or 2007 while living in [place], [place]  . Hepatitis C   . Hyperlipidemia   . Hypertension   . Ischemia of foot 05/31/2016  . Osteomyelitis ([place])    per Nursing Facility  . Peripheral neuropathy   . Pneumonia X 1  . PVD (peripheral vascular disease) ([place])   . Type II diabetes mellitus ([place])   . 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 Pneumonia due to COVID-19 virus 02/24/2019  . Acute respiratory failure with hypoxia ([place]) 02/20/2019  . COVID-19 virus detected 02/20/2019  . UTI (urinary tract infection) 02/20/2019  . DNR (do not resuscitate) 02/20/2019  . PAD (peripheral artery  disease) ([place]) 08/22/2016  . Osteomyelitis of foot ([place]) 07/08/2016  . S/P transmetatarsal amputation of foot, right ([place]) 06/26/2016  . Constipation due to opioid therapy 06/15/2016  . Physical deconditioning 06/10/2016  . 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 Chronic diastolic CHF (congestive heart failure) ([place]) 06/10/2016  . Ischemic pain of right foot 06/04/2016  . Ischemia of foot 05/31/2016  . 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 Dyslipidemia associated with type 2 diabetes mellitus ([place]) 01/21/2016  . Uncontrolled type II diabetes with peripheral autonomic neuropathy ([place]) 01/17/2016  . Weight gain 12/20/2015  . Edema of right lower extremity 12/20/2015  . Cough 11/26/2015  . GERD (gastroesophageal reflux disease) 10/08/2015  . Amputation of left lower extremity below knee ([place]) 08/21/2015  . Phantom limb pain ([place])   . Anemia of chronic disease   . BPH (benign prostatic hyperplasia)   . ETOH abuse   . PVD (peripheral vascular disease) ([place])   . Chronic hepatitis C without hepatic coma ([place])   . History of CVA (cerebrovascular accident) without residual deficits   . Hyponatremia   . Chronic kidney disease, stage 3 07/18/2015  . Essential hypertension 07/18/2015  . S/P lumbar spinal fusion 11/25/2013  . Melena 12/03/2011  . Hemiparesthesia 02/03/2011  . Polysubstance abuse ([place]) 02/03/2011  .  Tobacco abuse 02/03/2011    Past Surgical History:  Procedure Laterality Date  . ABDOMINAL AORTOGRAM N/A 06/03/2016   Procedure: Abdominal Aortogram;  Surgeon: Waynetta Sandy, MD;  Location: [facility];  Service: Cardiovascular;  Laterality: N/A;  . AMPUTATION Left 08/16/2015   Procedure: LEFT BELOW THE KNEE AMPUTATION ;  Surgeon: Serafina Mitchell, MD;  Location: [facility];  Service: Vascular;  Laterality: Left;  . AMPUTATION Right 08/22/2016   Procedure: AMPUTATION BELOW KNEE-RIGHT;  Surgeon: Waynetta Sandy,  MD;  Location: [facility];  Service: Vascular;  Laterality: Right;  . BACK SURGERY    . BELOW KNEE LEG AMPUTATION Left 08/16/2015  . BIOPSY N/A 09/03/2012   Procedure: BIOPSY;  Surgeon: Daneil Dolin, MD;  Location: [facility];  Service: Endoscopy;  Laterality: N/A;  gastric and gastric mucosa  . BIOPSY N/A 12/03/2012   Procedure: BIOPSY;  Surgeon: Daneil Dolin, MD;  Location: [facility];  Service: Endoscopy;  Laterality: N/A;  . COLONOSCOPY WITH PROPOFOL N/A 09/03/2012   BJY:NWGNFAO polyp-removed as outlined above. Prominent internal hemorrhoids. Tubular adenoma  . ESOPHAGOGASTRODUODENOSCOPY (EGD) WITH PROPOFOL N/A 09/03/2012   [id number]:[id number] hernia. Gastric diverticulum. Gastric ulcers with associated erosions. Duodenal erosions. Status post gastric biopsy. H.PYLORI gastritis   . ESOPHAGOGASTRODUODENOSCOPY (EGD) WITH PROPOFOL N/A 12/03/2012   Dr. Gala Romney: gastric diverticulum, gastric erosions and scar. Previously noted gastric ulcer completed healed. Biopsy without H.pylori.   . ESOPHAGOGASTRODUODENOSCOPY (EGD) WITH PROPOFOL N/A 01/23/2016   Procedure: ESOPHAGOGASTRODUODENOSCOPY (EGD) WITH PROPOFOL;  Surgeon: Doran Stabler, MD;  [facility]: [facility];  Service: Gastroenterology;  Laterality: N/A;  . I & D EXTREMITY Right 07/11/2016   Procedure: DELAY PRIMARY CLOSURE FOOT AMPUTATION;  Surgeon: Edrick Kins, DPM;  Location: [facility];  Service: Podiatry;  Laterality: Right;  . LIVER BIOPSY  2005   Done in [place], [place]. Chronic hepatitis with mild periportal inflammation, lobular unicellular necrosis and portal fibrosis. Grade 2, stage 1-2.  . LOWER EXTREMITY ANGIOGRAPHY Right 06/03/2016   Procedure: Lower Extremity Angiography;  Surgeon: Waynetta Sandy, MD;  Location: [facility];  Service: Cardiovascular;  Laterality: Right;  . MAXIMUM ACCESS (MAS)POSTERIOR LUMBAR INTERBODY FUSION (PLIF) 1 LEVEL N/A 11/25/2013   Procedure: FOR MAXIMUM ACCESS (MAS) POSTERIOR LUMBAR INTERBODY FUSION (PLIF) 1  LEVEL;  Surgeon: Eustace Moore, MD;  Location: [facility];  Service: Neurosurgery;  Laterality: N/A;  FOR MAXIMUM ACCESS (MAS) POSTERIOR LUMBAR INTERBODY FUSION (PLIF) 1 LEVEL LUMBAR 3-4  . PERIPHERAL VASCULAR ATHERECTOMY Right 06/03/2016   Procedure: Peripheral Vascular Atherectomy;  Surgeon: Waynetta Sandy, MD;  Location: [facility];  Service: Cardiovascular;  Laterality: Right;  SFA WITH STENT  . PERIPHERAL VASCULAR CATHETERIZATION Left 08/01/2015   Procedure: Lower Extremity Angiography;  Surgeon: Serafina Mitchell, MD;  Location: [facility];  Service: Cardiovascular;  Laterality: Left;  . PERIPHERAL VASCULAR CATHETERIZATION N/A 08/01/2015   Procedure: Abdominal Aortogram;  Surgeon: Serafina Mitchell, MD;  Location: [facility];  Service: Cardiovascular;  Laterality: N/A;  . PERIPHERAL VASCULAR CATHETERIZATION N/A 08/08/2015   Procedure: Abdominal Aortogram w/Lower Extremity;  Surgeon: Serafina Mitchell, MD;  Location: [facility];  Service: Cardiovascular;  Laterality: N/A;  . PERIPHERAL VASCULAR CATHETERIZATION Left 08/08/2015   Procedure: Peripheral Vascular Balloon Angioplasty;  Surgeon: Serafina Mitchell, MD;  Location: [facility];  Service: Cardiovascular;  Laterality: Left;  left popiteal artery, left peronealtrunk, left post tibial  . POLYPECTOMY N/A 09/03/2012  Procedure: POLYPECTOMY;  Surgeon: Daneil Dolin, MD;  [facility];  Service: Endoscopy;  Laterality: N/A;  cecal polyp  . TONSILLECTOMY    . TRANSMETATARSAL AMPUTATION Right 06/13/2016   Procedure: RIGHT TRANSMETATARSAL AMPUTATION;  Surgeon: Waynetta Sandy, MD;  Location: [facility];  Service: Vascular;  Laterality: Right;  . TRANSMETATARSAL AMPUTATION Right 07/10/2016   Procedure: REVISION RIGHT TRANSMETATARSAL AMPUTATION;  Surgeon: Waynetta Sandy, MD;  Location[facility];  Service: Vascular;  Laterality: Right;       Family History  Problem Relation Age of Onset  .  Breast cancer Mother   . Heart failure Mother   . Diabetes Father   . Hypertension Father   . Heart disease Father   . Hyperlipidemia Father   . Diabetes Sister   . Hypertension Sister   . Breast cancer Sister   . Colon cancer Neg Hx   . Liver disease Neg Hx     Social History   Tobacco Use  . Smoking status: Former Smoker    Packs/day: 1.00    Years: 47.00    Pack years: 47.00    Types: Cigarettes    Quit date: 07/13/2015    Years since quitting: 4.2  . Smokeless tobacco: Never Used  Vaping Use  . Vaping Use: Never used  Substance Use Topics  . Alcohol use: No    Alcohol/week: 0.0 standard drinks    Comment:  none 08/15/2015 "stopped drinking 3-4 years ago"  . Drug use: No    Comment: 08/15/2015 "last used cocaine 3-4 months ago"    Home Medications Prior to Admission medications   Medication Sig Start Date End Date Taking? Authorizing Provider  acetaminophen (TYLENOL) 325 MG tablet Take 2 tablets (650 mg total) by mouth every 6 (six) hours as needed for mild pain or headache (fever >/= 101). 02/24/19   Roxan Hockey, MD  amLODipine (NORVASC) 5 MG tablet Take 5 mg by mouth daily. Patient not taking: Reported on 10/21/2019    [provider]  aspirin EC 81 MG tablet Take 1 tablet (81 mg total) by mouth daily with breakfast. 02/24/19   Roxan Hockey, MD  baclofen (LIORESAL) 10 MG tablet Take 1 tablet (10 mg total) by mouth 3 (three) times daily as needed for muscle spasms. Patient taking differently: Take 10 mg by mouth 2 (two) times daily. For shoulder spasm 02/25/19   Roxan Hockey, MD  Baclofen 5 MG TABS Take 15 mg by mouth daily. For muscle spasms    [provider]  carvedilol (COREG) 3.125 MG tablet Take 1 tablet (3.125 mg total) by mouth 2 (two) times daily with a meal. Patient taking differently: Take 6.25 mg by mouth 2 (two) times daily with a meal.  08/10/15   Cristal Ford, DO  Cholecalciferol (VITAMIN D3) 2000 units TABS Take 1 tablet by  mouth daily. Patient not taking: Reported on 10/21/2019    [provider]  clopidogrel (PLAVIX) 75 MG tablet Take 1 tablet (75 mg total) by mouth daily with breakfast. 06/05/16   Dhungel, Nishant, MD  cyanocobalamin 1000 MCG tablet Take 500 mcg by mouth daily.     [provider]  DULoxetine (CYMBALTA) 30 MG capsule Take 1 capsule by mouth daily. 10/20/19   [provider]  ferrous sulfate 325 (65 FE) MG tablet Take 325 mg by mouth 3 (three) times daily with meals.    [provider]  gabapentin (NEURONTIN) 600 MG tablet Take 1 tablet (600 mg total) by  mouth 3 (three) times daily. Patient not taking: Reported on 10/21/2019 07/15/16   Domenic Polite, MD  insulin aspart (NOVOLOG) 100 UNIT/ML injection Inject 5 Units into the skin 3 (three) times daily after meals. Patient taking differently: Inject 8 Units into the skin 3 (three) times daily with meals.  07/15/16   Domenic Polite, MD  insulin glargine (LANTUS) 100 UNIT/ML injection Inject 0.26 mLs (26 Units total) into the skin at bedtime. Patient taking differently: Inject 30 Units into the skin at bedtime.  02/24/19   Roxan Hockey, MD  lactulose (CHRONULAC) 10 GM/15ML solution Take 15 mLs by mouth 3 (three) times daily. 08/01/19   [provider]  Lidocaine 4 % PTCH Apply 1 patch topically daily. Apply 1 patch to both stumps once daily    [provider]  lisinopril (ZESTRIL) 10 MG tablet Take 1 tablet (10 mg total) by mouth daily. 02/24/19   Roxan Hockey, MD  Melatonin 5 MG TABS Take 1 tablet by mouth at bedtime.    [provider]  metFORMIN (GLUCOPHAGE) 850 MG tablet Take 850 mg by mouth 2 (two) times daily. 10/14/19   [provider]  omeprazole (PRILOSEC) 20 MG capsule Take 1 capsule (20 mg total) by mouth daily. Patient not taking: Reported on 10/21/2019 02/24/19 02/24/20  Roxan Hockey, MD  pantoprazole (PROTONIX) 20 MG tablet Take 20 mg by mouth daily. 09/26/19   [provider]  polyethylene glycol (MIRALAX / GLYCOLAX) 17 g packet Take 17 g by mouth daily.    [provider]  pregabalin (LYRICA) 100 MG capsule Take 1 capsule by mouth at bedtime. 10/20/19   [provider]  pregabalin (LYRICA) 75 MG capsule Take 1 capsule by mouth 2 (two) times daily. 10/20/19   [provider]  rosuvastatin (CRESTOR) 5 MG tablet Take 5 mg by mouth daily. 09/16/19   [provider]  senna-docusate (SENOKOT-S) 8.6-50 MG tablet Take 2 tablets by mouth at bedtime. 02/25/19   Roxan Hockey, MD  simvastatin (ZOCOR) 20 MG tablet Take 20 mg by mouth daily.  Patient not taking: Reported on 10/21/2019    [provider]  tamsulosin (FLOMAX) 0.4 MG CAPS capsule Take 1 capsule (0.4 mg total) by mouth daily. 08/10/15   Mikhail, Velta Addison, DO  torsemide (DEMADEX) 20 MG tablet Take 2 tablets (40 mg total) by mouth daily. 09/22/18 10/21/19  Strader, Fransisco Hertz, PA-C  vitamin C (ASCORBIC ACID) 500 MG tablet Take 500 mg by mouth 2 (two) times daily.    [provider]  zinc sulfate 220 (50 Zn) MG capsule Take 1 capsule (220 mg total) by mouth daily. Patient not taking: Reported on 10/21/2019 02/25/19   Roxan Hockey, MD    Allergies    No known allergies  Review of Systems   Review of Systems  All other systems reviewed and are negative.   Physical Exam Updated Vital Signs BP 134/71 (BP Location: Right Arm)   Pulse 89   Temp 99.2 F (37.3 C) (Oral)   Resp 14   Ht 5\' 7"  (1.702 m)   Wt 123.4 kg   SpO2 100%   BMI 42.61 kg/m   Physical Exam Vitals and nursing note reviewed.  Constitutional:      General: He is not in acute distress.    Appearance: He is well-developed. He is not diaphoretic.  HENT:     Head: Normocephalic and atraumatic.  Cardiovascular:     Rate and Rhythm: Normal rate and regular rhythm.  Heart sounds: No murmur heard.  No friction rub.  Pulmonary:     Effort: Pulmonary effort is normal. No respiratory  distress.     Breath sounds: Normal breath sounds. No wheezing or rales.  Abdominal:     General: Bowel sounds are normal. There is no distension.     Palpations: Abdomen is soft.     Tenderness: There is no abdominal tenderness. There is no right CVA tenderness or left CVA tenderness.     Comments: Abdomen is benign.  No ttp.  Musculoskeletal:        General: Normal range of motion.     Cervical back: Normal range of motion and neck supple.  Skin:    General: Skin is warm and dry.  Neurological:     Mental Status: He is alert and oriented to person, place, and time.     Coordination: Coordination normal.     ED Results / Procedures / Treatments   Labs (all labs ordered are listed, but only abnormal results are displayed) Labs Reviewed  GASTROINTESTINAL PANEL BY PCR, STOOL (REPLACES STOOL CULTURE)  C DIFFICILE QUICK SCREEN W PCR REFLEX  BASIC METABOLIC PANEL  CBC WITH DIFFERENTIAL/PLATELET    EKG None  Radiology CT Abdomen Pelvis W Contrast  Result Date: 10/21/2019 CLINICAL DATA:  Abdominal pain, diarrhea, weakness EXAM: CT ABDOMEN AND PELVIS WITH CONTRAST TECHNIQUE: Multidetector CT imaging of the abdomen and pelvis was performed using the standard protocol following bolus administration of intravenous contrast. CONTRAST:  136mL OMNIPAQUE IOHEXOL 300 MG/ML  SOLN COMPARISON:  2016 FINDINGS: Lower chest: Mild bibasilar atelectasis. Hepatobiliary: Probable [place]ock steatosis. Gallbladder is unremarkable. There is no biliary dilatation. Pancreas: Unremarkable. Spleen: Unremarkable. Adrenals/Urinary Tract: Adrenals, kidneys, and bladder are unremarkable. Stomach/Bowel: Small posterior directed diverticulum of the gastric fundus. Stomach is otherwise unremarkable. Bowel is normal in caliber. Normal appendix. Vascular/Lymphatic: Diffuse aortoiliac atherosclerosis. No enlarged lymph nodes. Reproductive: Unremarkable. Other: No ascites. No significant abnormality of the abdominal wall.  Musculoskeletal: Lumbar fusion at L3-L4. No acute osseous abnormality. IMPRESSION: No acute abnormality or findings to account for reported symptoms. Electronically Signed   By: Macy Mis M.D.   On: 10/21/2019 13:04   DG Chest Port 1 View  Result Date: 10/21/2019 CLINICAL DATA:  Cough and congestion EXAM: PORTABLE CHEST 1 VIEW COMPARISON:  02/21/2019 FINDINGS: Cardiac shadow is within normal limits. The lungs are well aerated bilaterally. No focal infiltrate or sizable effusion is seen. No bony abnormality is noted. IMPRESSION: No acute abnormality noted. Electronically Signed   By: Inez Catalina M.D.   On: 10/21/2019 12:31    Procedures Procedures (including critical care time)  Medications Ordered in ED Medications  sodium chloride 0.9 % bolus 500 mL (has no administration in time range)    ED Course  I have reviewed the triage vital signs and the nursing notes.  Pertinent labs & imaging results that were available during my care of the patient were reviewed by me and considered in my medical decision making (see chart for details).    MDM Rules/Calculators/A&P  Patient sent here from his extended care facility for evaluation of diarrhea.  Patient seen 2 days ago with similar complaints.  His laboratory studies were unremarkable and CT was negative.  He was sent back after experiencing more of this.  On exam, patient's abdomen is benign.  He appears well-hydrated.  Laboratory studies show no leukocytosis and electrolytes show a mild bump in his creatinine to 2.1.  Patient was given normal saline  for this.  A stool sample was obtained and sent for GI panel.  This is currently pending.    Upon reviewing the patient's medications, he is receiving lactulose twice daily.  We have contacted [facility] and determined that this medication is still being administered despite his diarrhea.  I will advise them to stop this awaiting results of his stool studies.  Also of note is that a C.  difficile test was unable to be performed as the stool was not liquid.  Care was also discussed with Dr. Josephine Cables who does not feel as the patient meets inpatient requirements.  Final Clinical Impression(s) / ED Diagnoses Final diagnoses:  None    Rx / DC Orders ED Discharge Orders    None       Veryl Speak, MD 10/23/19 [phone]

## 2019-10-23 NOTE — ED Notes (Addendum)
Facility called to check on pt.  Informed pt will be transport sometime th is am.

## 2019-10-23 NOTE — ED Notes (Signed)
Pt given urinal.  Pt is on his cell talking to family.  Pt is telling family that he has been in ED for over 24 hours and has not had anything to eat.  Family member informs pt that he has only been in ED since about 1 am.

## 2019-10-23 NOTE — ED Triage Notes (Signed)
Patient brought in by EMS for diarrhea from nursing facility.

## 2020-03-20 ENCOUNTER — Inpatient Hospital Stay (HOSPITAL_COMMUNITY)
Admission: EM | Admit: 2020-03-20 | Discharge: 2020-04-08 | DRG: 280 | Disposition: A | Discharge status: Skilled Nursing Facility | Payer: Medicare (Managed Care) | Attending: Internal Medicine | Admitting: Internal Medicine

## 2020-03-20 ENCOUNTER — Observation Stay (HOSPITAL_COMMUNITY): Payer: Medicare (Managed Care)

## 2020-03-20 ENCOUNTER — Encounter (HOSPITAL_COMMUNITY): Payer: Self-pay | Admitting: Emergency Medicine

## 2020-03-20 ENCOUNTER — Emergency Department (HOSPITAL_COMMUNITY): Payer: Medicare (Managed Care)

## 2020-03-20 ENCOUNTER — Other Ambulatory Visit: Payer: Self-pay

## 2020-03-20 DIAGNOSIS — E1122 Type 2 diabetes mellitus with diabetic chronic kidney disease: Secondary | ICD-10-CM | POA: Diagnosis present

## 2020-03-20 DIAGNOSIS — N400 Enlarged prostate without lower urinary tract symptoms: Secondary | ICD-10-CM | POA: Diagnosis not present

## 2020-03-20 DIAGNOSIS — J810 Acute pulmonary edema: Secondary | ICD-10-CM | POA: Diagnosis not present

## 2020-03-20 DIAGNOSIS — R9431 Abnormal electrocardiogram [ECG] [EKG]: Secondary | ICD-10-CM | POA: Diagnosis present

## 2020-03-20 DIAGNOSIS — K31819 Angiodysplasia of stomach and duodenum without bleeding: Secondary | ICD-10-CM | POA: Diagnosis not present

## 2020-03-20 DIAGNOSIS — I5032 Chronic diastolic (congestive) heart failure: Secondary | ICD-10-CM | POA: Diagnosis not present

## 2020-03-20 DIAGNOSIS — Z87891 Personal history of nicotine dependence: Secondary | ICD-10-CM

## 2020-03-20 DIAGNOSIS — Z981 Arthrodesis status: Secondary | ICD-10-CM

## 2020-03-20 DIAGNOSIS — K219 Gastro-esophageal reflux disease without esophagitis: Secondary | ICD-10-CM | POA: Diagnosis not present

## 2020-03-20 DIAGNOSIS — Z6841 Body Mass Index (BMI) 40.0 and over, adult: Secondary | ICD-10-CM | POA: Diagnosis not present

## 2020-03-20 DIAGNOSIS — N179 Acute kidney failure, unspecified: Secondary | ICD-10-CM | POA: Diagnosis present

## 2020-03-20 DIAGNOSIS — I5043 Acute on chronic combined systolic (congestive) and diastolic (congestive) heart failure: Secondary | ICD-10-CM | POA: Diagnosis present

## 2020-03-20 DIAGNOSIS — R0602 Shortness of breath: Secondary | ICD-10-CM | POA: Diagnosis present

## 2020-03-20 DIAGNOSIS — D638 Anemia in other chronic diseases classified elsewhere: Secondary | ICD-10-CM | POA: Diagnosis present

## 2020-03-20 DIAGNOSIS — B182 Chronic viral hepatitis C: Secondary | ICD-10-CM | POA: Diagnosis present

## 2020-03-20 DIAGNOSIS — IMO0002 Reserved for concepts with insufficient information to code with codable children: Secondary | ICD-10-CM | POA: Diagnosis present

## 2020-03-20 DIAGNOSIS — K5520 Angiodysplasia of colon without hemorrhage: Secondary | ICD-10-CM | POA: Diagnosis not present

## 2020-03-20 DIAGNOSIS — G546 Phantom limb syndrome with pain: Secondary | ICD-10-CM | POA: Diagnosis present

## 2020-03-20 DIAGNOSIS — J45909 Unspecified asthma, uncomplicated: Secondary | ICD-10-CM | POA: Diagnosis not present

## 2020-03-20 DIAGNOSIS — E1165 Type 2 diabetes mellitus with hyperglycemia: Secondary | ICD-10-CM | POA: Diagnosis present

## 2020-03-20 DIAGNOSIS — E1151 Type 2 diabetes mellitus with diabetic peripheral angiopathy without gangrene: Secondary | ICD-10-CM | POA: Diagnosis present

## 2020-03-20 DIAGNOSIS — Z9981 Dependence on supplemental oxygen: Secondary | ICD-10-CM

## 2020-03-20 DIAGNOSIS — Z8673 Personal history of transient ischemic attack (TIA), and cerebral infarction without residual deficits: Secondary | ICD-10-CM

## 2020-03-20 DIAGNOSIS — R5381 Other malaise: Secondary | ICD-10-CM | POA: Diagnosis present

## 2020-03-20 DIAGNOSIS — E785 Hyperlipidemia, unspecified: Secondary | ICD-10-CM | POA: Diagnosis present

## 2020-03-20 DIAGNOSIS — K314 Gastric diverticulum: Secondary | ICD-10-CM

## 2020-03-20 DIAGNOSIS — S88112A Complete traumatic amputation at level between knee and ankle, left lower leg, initial encounter: Secondary | ICD-10-CM | POA: Diagnosis not present

## 2020-03-20 DIAGNOSIS — R600 Localized edema: Secondary | ICD-10-CM | POA: Diagnosis present

## 2020-03-20 DIAGNOSIS — Z720 Tobacco use: Secondary | ICD-10-CM | POA: Diagnosis present

## 2020-03-20 DIAGNOSIS — Z8616 Personal history of COVID-19: Secondary | ICD-10-CM

## 2020-03-20 DIAGNOSIS — R300 Dysuria: Secondary | ICD-10-CM | POA: Diagnosis not present

## 2020-03-20 DIAGNOSIS — Z66 Do not resuscitate: Secondary | ICD-10-CM | POA: Diagnosis present

## 2020-03-20 DIAGNOSIS — E1143 Type 2 diabetes mellitus with diabetic autonomic (poly)neuropathy: Secondary | ICD-10-CM | POA: Diagnosis present

## 2020-03-20 DIAGNOSIS — I739 Peripheral vascular disease, unspecified: Secondary | ICD-10-CM | POA: Diagnosis present

## 2020-03-20 DIAGNOSIS — Z833 Family history of diabetes mellitus: Secondary | ICD-10-CM

## 2020-03-20 DIAGNOSIS — Z20822 Contact with and (suspected) exposure to covid-19: Secondary | ICD-10-CM | POA: Diagnosis present

## 2020-03-20 DIAGNOSIS — I255 Ischemic cardiomyopathy: Secondary | ICD-10-CM | POA: Diagnosis present

## 2020-03-20 DIAGNOSIS — K2970 Gastritis, unspecified, without bleeding: Secondary | ICD-10-CM

## 2020-03-20 DIAGNOSIS — R079 Chest pain, unspecified: Secondary | ICD-10-CM

## 2020-03-20 DIAGNOSIS — I130 Hypertensive heart and chronic kidney disease with heart failure and stage 1 through stage 4 chronic kidney disease, or unspecified chronic kidney disease: Secondary | ICD-10-CM | POA: Diagnosis present

## 2020-03-20 DIAGNOSIS — K2990 Gastroduodenitis, unspecified, without bleeding: Secondary | ICD-10-CM | POA: Diagnosis present

## 2020-03-20 DIAGNOSIS — K760 Fatty (change of) liver, not elsewhere classified: Secondary | ICD-10-CM | POA: Diagnosis present

## 2020-03-20 DIAGNOSIS — E1121 Type 2 diabetes mellitus with diabetic nephropathy: Secondary | ICD-10-CM | POA: Diagnosis present

## 2020-03-20 DIAGNOSIS — K921 Melena: Secondary | ICD-10-CM | POA: Diagnosis present

## 2020-03-20 DIAGNOSIS — Z794 Long term (current) use of insulin: Secondary | ICD-10-CM

## 2020-03-20 DIAGNOSIS — K3189 Other diseases of stomach and duodenum: Secondary | ICD-10-CM | POA: Diagnosis not present

## 2020-03-20 DIAGNOSIS — B3781 Candidal esophagitis: Secondary | ICD-10-CM | POA: Diagnosis not present

## 2020-03-20 DIAGNOSIS — I214 Non-ST elevation (NSTEMI) myocardial infarction: Secondary | ICD-10-CM | POA: Diagnosis present

## 2020-03-20 DIAGNOSIS — E1169 Type 2 diabetes mellitus with other specified complication: Secondary | ICD-10-CM | POA: Diagnosis present

## 2020-03-20 DIAGNOSIS — I161 Hypertensive emergency: Secondary | ICD-10-CM | POA: Diagnosis present

## 2020-03-20 DIAGNOSIS — I10 Essential (primary) hypertension: Secondary | ICD-10-CM | POA: Diagnosis present

## 2020-03-20 DIAGNOSIS — Z955 Presence of coronary angioplasty implant and graft: Secondary | ICD-10-CM

## 2020-03-20 DIAGNOSIS — D649 Anemia, unspecified: Secondary | ICD-10-CM | POA: Diagnosis not present

## 2020-03-20 DIAGNOSIS — Z86010 Personal history of colonic polyps: Secondary | ICD-10-CM

## 2020-03-20 DIAGNOSIS — Z89512 Acquired absence of left leg below knee: Secondary | ICD-10-CM

## 2020-03-20 DIAGNOSIS — R195 Other fecal abnormalities: Secondary | ICD-10-CM | POA: Diagnosis not present

## 2020-03-20 DIAGNOSIS — F1910 Other psychoactive substance abuse, uncomplicated: Secondary | ICD-10-CM | POA: Diagnosis present

## 2020-03-20 DIAGNOSIS — J9601 Acute respiratory failure with hypoxia: Secondary | ICD-10-CM | POA: Diagnosis present

## 2020-03-20 DIAGNOSIS — D62 Acute posthemorrhagic anemia: Secondary | ICD-10-CM | POA: Diagnosis present

## 2020-03-20 DIAGNOSIS — I358 Other nonrheumatic aortic valve disorders: Secondary | ICD-10-CM | POA: Diagnosis present

## 2020-03-20 DIAGNOSIS — I2510 Atherosclerotic heart disease of native coronary artery without angina pectoris: Secondary | ICD-10-CM | POA: Diagnosis not present

## 2020-03-20 DIAGNOSIS — K640 First degree hemorrhoids: Secondary | ICD-10-CM

## 2020-03-20 DIAGNOSIS — I248 Other forms of acute ischemic heart disease: Secondary | ICD-10-CM | POA: Diagnosis not present

## 2020-03-20 DIAGNOSIS — Z89511 Acquired absence of right leg below knee: Secondary | ICD-10-CM

## 2020-03-20 DIAGNOSIS — Z8249 Family history of ischemic heart disease and other diseases of the circulatory system: Secondary | ICD-10-CM

## 2020-03-20 DIAGNOSIS — N183 Chronic kidney disease, stage 3 unspecified: Secondary | ICD-10-CM | POA: Diagnosis present

## 2020-03-20 DIAGNOSIS — R0603 Acute respiratory distress: Secondary | ICD-10-CM

## 2020-03-20 DIAGNOSIS — I509 Heart failure, unspecified: Secondary | ICD-10-CM

## 2020-03-20 DIAGNOSIS — I25110 Atherosclerotic heart disease of native coronary artery with unstable angina pectoris: Secondary | ICD-10-CM | POA: Diagnosis not present

## 2020-03-20 DIAGNOSIS — D631 Anemia in chronic kidney disease: Secondary | ICD-10-CM | POA: Diagnosis present

## 2020-03-20 DIAGNOSIS — I5041 Acute combined systolic (congestive) and diastolic (congestive) heart failure: Secondary | ICD-10-CM | POA: Diagnosis not present

## 2020-03-20 DIAGNOSIS — Z79899 Other long term (current) drug therapy: Secondary | ICD-10-CM

## 2020-03-20 DIAGNOSIS — K7400 Hepatic fibrosis, unspecified: Secondary | ICD-10-CM | POA: Diagnosis present

## 2020-03-20 DIAGNOSIS — Z7982 Long term (current) use of aspirin: Secondary | ICD-10-CM

## 2020-03-20 DIAGNOSIS — Z7902 Long term (current) use of antithrombotics/antiplatelets: Secondary | ICD-10-CM

## 2020-03-20 DIAGNOSIS — D509 Iron deficiency anemia, unspecified: Secondary | ICD-10-CM | POA: Diagnosis present

## 2020-03-20 DIAGNOSIS — N1831 Chronic kidney disease, stage 3a: Secondary | ICD-10-CM | POA: Diagnosis present

## 2020-03-20 LAB — COMPREHENSIVE METABOLIC PANEL
ALT: 10 U/L (ref 0–44)
AST: 26 U/L (ref 15–41)
Albumin: 3.2 g/dL — ABNORMAL LOW (ref 3.5–5.0)
Alkaline Phosphatase: 51 U/L (ref 38–126)
Anion gap: 3 — ABNORMAL LOW (ref 5–15)
BUN: 14 mg/dL (ref 8–23)
CO2: 33 mmol/L — ABNORMAL HIGH (ref 22–32)
Calcium: 8.5 mg/dL — ABNORMAL LOW (ref 8.9–10.3)
Chloride: 98 mmol/L (ref 98–111)
Creatinine, Ser: 1.39 mg/dL — ABNORMAL HIGH (ref 0.61–1.24)
GFR, Estimated: 56 mL/min — ABNORMAL LOW (ref >60)
Glucose, Bld: 308 mg/dL — ABNORMAL HIGH (ref 70–99)
Potassium: 4.2 mmol/L (ref 3.5–5.1)
Sodium: 134 mmol/L — ABNORMAL LOW (ref 135–145)
Total Bilirubin: 0.3 mg/dL (ref 0.3–1.2)
Total Protein: 8.9 g/dL — ABNORMAL HIGH (ref 6.5–8.1)

## 2020-03-20 LAB — CBG MONITORING, ED
Glucose-Capillary: 103 mg/dL — ABNORMAL HIGH (ref 70–99)
Glucose-Capillary: 180 mg/dL — ABNORMAL HIGH (ref 70–99)
Glucose-Capillary: 205 mg/dL — ABNORMAL HIGH (ref 70–99)
Glucose-Capillary: 281 mg/dL — ABNORMAL HIGH (ref 70–99)
Glucose-Capillary: 61 mg/dL — ABNORMAL LOW (ref 70–99)

## 2020-03-20 LAB — ECHOCARDIOGRAM COMPLETE
AR max vel: 2.03 cm2
AV Area VTI: 2.09 cm2
AV Area mean vel: 1.81 cm2
AV Mean grad: 4.9 mmHg
AV Peak grad: 8.7 mmHg
Ao pk vel: 1.48 m/s
Area-P 1/2: 2.75 cm2
S' Lateral: 4.5 cm
Weight: 4409.2 oz

## 2020-03-20 LAB — URINALYSIS, ROUTINE W REFLEX MICROSCOPIC
Bilirubin Urine: NEGATIVE
Glucose, UA: 50 mg/dL — AB
Hgb urine dipstick: NEGATIVE
Ketones, ur: NEGATIVE mg/dL
Nitrite: NEGATIVE
Protein, ur: 30 mg/dL — AB
Specific Gravity, Urine: 1.01 (ref 1.005–1.030)
pH: 5 (ref 5.0–8.0)

## 2020-03-20 LAB — HEPARIN LEVEL (UNFRACTIONATED)
Heparin Unfractionated: 0.44 IU/mL (ref 0.30–0.70)
Heparin Unfractionated: 0.26 IU/mL — ABNORMAL LOW (ref 0.30–0.70)

## 2020-03-20 LAB — CBC
HCT: 25.5 % — ABNORMAL LOW (ref 39.0–52.0)
Hemoglobin: 7.6 g/dL — ABNORMAL LOW (ref 13.0–17.0)
MCH: 29.9 pg (ref 26.0–34.0)
MCHC: 29.8 g/dL — ABNORMAL LOW (ref 30.0–36.0)
MCV: 100.4 fL — ABNORMAL HIGH (ref 80.0–100.0)
Platelets: 256 10*3/uL (ref 150–400)
RBC: 2.54 MIL/uL — ABNORMAL LOW (ref 4.22–5.81)
RDW: 14.5 % (ref 11.5–15.5)
WBC: 6 10*3/uL (ref 4.0–10.5)
nRBC: 0 % (ref 0.0–0.2)

## 2020-03-20 LAB — HEMOGLOBIN AND HEMATOCRIT, BLOOD
HCT: 26.3 % — ABNORMAL LOW (ref 39.0–52.0)
Hemoglobin: 7.9 g/dL — ABNORMAL LOW (ref 13.0–17.0)

## 2020-03-20 LAB — LACTIC ACID, PLASMA
Lactic Acid, Venous: 2.1 mmol/L (ref 0.5–1.9)
Lactic Acid, Venous: 1.6 mmol/L (ref 0.5–1.9)

## 2020-03-20 LAB — PROTIME-INR
INR: 1 (ref 0.8–1.2)
Prothrombin Time: 13.1 seconds (ref 11.4–15.2)

## 2020-03-20 LAB — PREPARE RBC (CROSSMATCH)

## 2020-03-20 LAB — HIV ANTIBODY (ROUTINE TESTING W REFLEX): HIV Screen 4th Generation wRfx: NONREACTIVE

## 2020-03-20 LAB — TROPONIN I (HIGH SENSITIVITY)
Troponin I (High Sensitivity): 2819 ng/L (ref <18)
Troponin I (High Sensitivity): 3337 ng/L (ref <18)

## 2020-03-20 LAB — BRAIN NATRIURETIC PEPTIDE: B Natriuretic Peptide: 213 pg/mL — ABNORMAL HIGH (ref 0.0–100.0)

## 2020-03-20 LAB — SARS CORONAVIRUS 2 BY RT PCR (HOSPITAL ORDER, PERFORMED IN ~~LOC~~ HOSPITAL LAB): SARS Coronavirus 2: NEGATIVE

## 2020-03-20 MED ORDER — ASPIRIN 300 MG RE SUPP
300.0000 mg | RECTAL | Status: AC
Start: 2020-03-20 — End: 2020-03-20

## 2020-03-20 MED ORDER — ASPIRIN EC 81 MG PO TBEC
81.0000 mg | DELAYED_RELEASE_TABLET | Freq: Every day | ORAL | Status: DC
  Administered 2020-03-21 – 2020-03-30 (×10): 81 mg via ORAL
  Filled 2020-03-20 (×10): qty 1

## 2020-03-20 MED ORDER — CLOPIDOGREL BISULFATE 75 MG PO TABS
75.0000 mg | ORAL_TABLET | Freq: Every day | ORAL | Status: DC
  Administered 2020-03-20 – 2020-03-21 (×2): 75 mg via ORAL
  Filled 2020-03-20 (×2): qty 1

## 2020-03-20 MED ORDER — IPRATROPIUM-ALBUTEROL 0.5-2.5 (3) MG/3ML IN SOLN: 3.0000 mL | Freq: Four times a day (QID) | RESPIRATORY_TRACT | Status: DC | PRN

## 2020-03-20 MED ORDER — FUROSEMIDE 10 MG/ML IJ SOLN
60.0000 mg | Freq: Once | INTRAMUSCULAR | Status: AC
  Administered 2020-03-20: 60 mg via INTRAVENOUS
  Filled 2020-03-20: qty 6

## 2020-03-20 MED ORDER — FUROSEMIDE 10 MG/ML IJ SOLN
80.0000 mg | Freq: Once | INTRAMUSCULAR | Status: AC
  Administered 2020-03-20: 80 mg via INTRAVENOUS
  Filled 2020-03-20: qty 8

## 2020-03-20 MED ORDER — HEPARIN (PORCINE) 25000 UT/250ML-% IV SOLN
1600.0000 [IU]/h | INTRAVENOUS | Status: DC
  Administered 2020-03-20: 1350 [IU]/h via INTRAVENOUS
  Filled 2020-03-20 (×2): qty 250

## 2020-03-20 MED ORDER — INSULIN GLARGINE 100 UNIT/ML ~~LOC~~ SOLN
30.0000 [IU] | Freq: Every day | SUBCUTANEOUS | Status: DC
  Administered 2020-03-21 – 2020-03-31 (×10): 30 [IU] via SUBCUTANEOUS
  Filled 2020-03-20 (×14): qty 0.3

## 2020-03-20 MED ORDER — INSULIN ASPART 100 UNIT/ML ~~LOC~~ SOLN
0.0000 [IU] | Freq: Every day | SUBCUTANEOUS | Status: DC
  Administered 2020-03-23: 4 [IU] via SUBCUTANEOUS
  Administered 2020-03-26 – 2020-03-28 (×3): 2 [IU] via SUBCUTANEOUS
  Administered 2020-03-29: 3 [IU] via SUBCUTANEOUS
  Administered 2020-03-31 – 2020-04-06 (×3): 2 [IU] via SUBCUTANEOUS
  Administered 2020-04-07: 3 [IU] via SUBCUTANEOUS
  Filled 2020-03-20: qty 1

## 2020-03-20 MED ORDER — FERROUS SULFATE 325 (65 FE) MG PO TABS
325.0000 mg | ORAL_TABLET | Freq: Every day | ORAL | Status: DC
  Administered 2020-03-20 – 2020-03-29 (×10): 325 mg via ORAL
  Filled 2020-03-20 (×10): qty 1

## 2020-03-20 MED ORDER — LIDOCAINE 5 % EX PTCH
1.0000 | MEDICATED_PATCH | Freq: Every day | CUTANEOUS | Status: DC
  Administered 2020-03-20 – 2020-04-08 (×11): 1 via TRANSDERMAL
  Filled 2020-03-20 (×18): qty 1

## 2020-03-20 MED ORDER — INSULIN ASPART 100 UNIT/ML ~~LOC~~ SOLN
0.0000 [IU] | Freq: Three times a day (TID) | SUBCUTANEOUS | Status: DC
  Administered 2020-03-20: 3 [IU] via SUBCUTANEOUS
  Administered 2020-03-20: 5 [IU] via SUBCUTANEOUS
  Filled 2020-03-20: qty 1

## 2020-03-20 MED ORDER — TAMSULOSIN HCL 0.4 MG PO CAPS
0.4000 mg | ORAL_CAPSULE | Freq: Every day | ORAL | Status: DC
  Administered 2020-03-20 – 2020-04-08 (×18): 0.4 mg via ORAL
  Filled 2020-03-20 (×19): qty 1

## 2020-03-20 MED ORDER — SODIUM CHLORIDE 0.9% IV SOLUTION: Freq: Once | INTRAVENOUS | Status: AC

## 2020-03-20 MED ORDER — DULOXETINE HCL 60 MG PO CPEP
60.0000 mg | ORAL_CAPSULE | Freq: Every day | ORAL | Status: DC
  Administered 2020-03-20 – 2020-04-08 (×18): 60 mg via ORAL
  Filled 2020-03-20 (×18): qty 1
  Filled 2020-03-20: qty 2

## 2020-03-20 MED ORDER — METFORMIN HCL 850 MG PO TABS
850.0000 mg | ORAL_TABLET | Freq: Two times a day (BID) | ORAL | Status: DC
  Filled 2020-03-20 (×4): qty 1

## 2020-03-20 MED ORDER — ASCORBIC ACID 500 MG PO TABS
500.0000 mg | ORAL_TABLET | Freq: Two times a day (BID) | ORAL | Status: DC
  Administered 2020-03-20 – 2020-04-08 (×37): 500 mg via ORAL
  Filled 2020-03-20 (×38): qty 1

## 2020-03-20 MED ORDER — VITAMIN B-12 1000 MCG PO TABS
500.0000 ug | ORAL_TABLET | Freq: Every day | ORAL | Status: DC
  Administered 2020-03-20 – 2020-04-08 (×18): 500 ug via ORAL
  Filled 2020-03-20 (×20): qty 1

## 2020-03-20 MED ORDER — BACLOFEN 10 MG PO TABS
10.0000 mg | ORAL_TABLET | Freq: Three times a day (TID) | ORAL | Status: DC | PRN
  Administered 2020-03-21 – 2020-04-07 (×6): 10 mg via ORAL
  Filled 2020-03-20 (×6): qty 1

## 2020-03-20 MED ORDER — LISINOPRIL 10 MG PO TABS
10.0000 mg | ORAL_TABLET | Freq: Every day | ORAL | Status: DC
  Administered 2020-03-20: 10 mg via ORAL
  Filled 2020-03-20: qty 1

## 2020-03-20 MED ORDER — FUROSEMIDE 10 MG/ML IJ SOLN: 80.0000 mg | Freq: Once | INTRAMUSCULAR | Status: DC

## 2020-03-20 MED ORDER — ASPIRIN 81 MG PO CHEW
324.0000 mg | CHEWABLE_TABLET | ORAL | Status: AC
  Administered 2020-03-20: 324 mg via ORAL
  Filled 2020-03-20: qty 4

## 2020-03-20 MED ORDER — GABAPENTIN 600 MG PO TABS: 600.0000 mg | ORAL_TABLET | Freq: Three times a day (TID) | ORAL | Status: DC

## 2020-03-20 MED ORDER — PANTOPRAZOLE SODIUM 40 MG PO TBEC
40.0000 mg | DELAYED_RELEASE_TABLET | Freq: Every day | ORAL | Status: DC
  Administered 2020-03-20 – 2020-04-08 (×18): 40 mg via ORAL
  Filled 2020-03-20 (×19): qty 1

## 2020-03-20 MED ORDER — ONDANSETRON HCL 4 MG/2ML IJ SOLN: 4.0000 mg | Freq: Four times a day (QID) | INTRAMUSCULAR | Status: DC | PRN

## 2020-03-20 MED ORDER — HEPARIN BOLUS VIA INFUSION
4000.0000 [IU] | Freq: Once | INTRAVENOUS | Status: AC
  Administered 2020-03-20: 4000 [IU] via INTRAVENOUS

## 2020-03-20 MED ORDER — FERROUS SULFATE 325 (65 FE) MG PO TABS: 325.0000 mg | ORAL_TABLET | Freq: Three times a day (TID) | ORAL | Status: DC

## 2020-03-20 MED ORDER — NITROGLYCERIN IN D5W 200-5 MCG/ML-% IV SOLN
5.0000 ug/min | INTRAVENOUS | Status: DC
  Administered 2020-03-20: 04:00:00 5 ug/min via INTRAVENOUS
  Administered 2020-03-20: 15 ug/min via INTRAVENOUS
  Administered 2020-03-20: 10 ug/min via INTRAVENOUS
  Administered 2020-03-20: 06:00:00 25 ug/min via INTRAVENOUS
  Administered 2020-03-20: 35 ug/min via INTRAVENOUS
  Administered 2020-03-20: 20 ug/min via INTRAVENOUS
  Administered 2020-03-21: 10 ug/min via INTRAVENOUS
  Administered 2020-03-22: 50 ug/min via INTRAVENOUS
  Administered 2020-03-22 (×2): 60 ug/min via INTRAVENOUS
  Administered 2020-03-22 – 2020-03-23 (×2): 65 ug/min via INTRAVENOUS
  Filled 2020-03-20 (×4): qty 250

## 2020-03-20 MED ORDER — POLYETHYLENE GLYCOL 3350 17 G PO PACK
17.0000 g | PACK | Freq: Every day | ORAL | Status: DC
  Administered 2020-03-20 – 2020-04-03 (×8): 17 g via ORAL
  Filled 2020-03-20 (×15): qty 1

## 2020-03-20 MED ORDER — CARVEDILOL 6.25 MG PO TABS
6.2500 mg | ORAL_TABLET | Freq: Two times a day (BID) | ORAL | Status: DC
  Administered 2020-03-20 – 2020-03-22 (×4): 6.25 mg via ORAL
  Filled 2020-03-20 (×2): qty 1
  Filled 2020-03-20 (×2): qty 2

## 2020-03-20 MED ORDER — NITROGLYCERIN 0.4 MG SL SUBL: 0.4000 mg | SUBLINGUAL_TABLET | SUBLINGUAL | Status: DC | PRN

## 2020-03-20 MED ORDER — INSULIN ASPART 100 UNIT/ML ~~LOC~~ SOLN
8.0000 [IU] | Freq: Three times a day (TID) | SUBCUTANEOUS | Status: DC
  Administered 2020-03-20 – 2020-04-04 (×37): 8 [IU] via SUBCUTANEOUS
  Filled 2020-03-20: qty 1

## 2020-03-20 MED ORDER — ACETAMINOPHEN 325 MG PO TABS
650.0000 mg | ORAL_TABLET | ORAL | Status: DC | PRN
  Administered 2020-03-21 – 2020-04-08 (×18): 650 mg via ORAL
  Filled 2020-03-20 (×19): qty 2

## 2020-03-20 MED ORDER — AMLODIPINE BESYLATE 5 MG PO TABS
5.0000 mg | ORAL_TABLET | Freq: Every day | ORAL | Status: DC
  Administered 2020-03-20 – 2020-03-24 (×5): 5 mg via ORAL
  Filled 2020-03-20 (×5): qty 1

## 2020-03-20 MED ORDER — TORSEMIDE 20 MG PO TABS
40.0000 mg | ORAL_TABLET | Freq: Every day | ORAL | Status: DC
  Administered 2020-03-21 – 2020-03-22 (×2): 40 mg via ORAL
  Filled 2020-03-20 (×2): qty 2

## 2020-03-20 MED ORDER — PREGABALIN 75 MG PO CAPS
75.0000 mg | ORAL_CAPSULE | Freq: Two times a day (BID) | ORAL | Status: DC
  Administered 2020-03-20 – 2020-04-01 (×24): 75 mg via ORAL
  Filled 2020-03-20 (×24): qty 1

## 2020-03-20 MED ORDER — ROSUVASTATIN CALCIUM 5 MG PO TABS
5.0000 mg | ORAL_TABLET | Freq: Every day | ORAL | Status: DC
  Administered 2020-03-20 – 2020-03-22 (×3): 5 mg via ORAL
  Filled 2020-03-20 (×3): qty 1

## 2020-03-20 NOTE — ED Notes (Signed)
Spoke with Remo Lipps Pharmacist, no change in Heparin drip.

## 2020-03-20 NOTE — ED Notes (Signed)
Date and time results received: 03/20/20 0257 Test: High Sensitivity Troponin Critical Value: 2819  Name of Provider Notified: Dr. Dayna Barker

## 2020-03-20 NOTE — Progress Notes (Signed)
ANTICOAGULATION CONSULT NOTE -   Pharmacy Consult for Heparin Indication: chest pain/ACS  Allergies  Allergen Reactions  . No Known Allergies     Patient Measurements: Weight: 125 kg (275 lb 9.2 oz)  Ht: 67 in IBW: 66 kg Heparin Dosing Weight: 94 kg  Vital Signs: Temp: 98.4 F (36.9 C) (02/07 1532) Temp Source: Oral (02/07 1532) BP: 132/76 (02/07 1800) Pulse Rate: 101 (02/07 1800)  Labs: Recent Labs    03/20/20 0119 03/20/20 0133 03/20/20 0324 03/20/20 0911 03/20/20 1725  HGB  --   --   --  7.6* 7.9*  HCT  --   --   --  25.5* 26.3*  PLT  --   --   --  256  --   LABPROT 13.1  --   --   --   --   INR 1.0  --   --   --   --   HEPARINUNFRC  --   --   --  0.44 0.26*  CREATININE  --   --  1.39*  --   --   TROPONINIHS  --  2,819* 3,337*  --   --     Estimated Creatinine Clearance: 65.4 mL/min (A) (by C-G formula based on SCr of 1.39 mg/dL (H)).   Medical History: Past Medical History:  Diagnosis Date  . Anemia   . Asthma   . BPH (benign prostatic hyperplasia)   . Cellulitis and abscess of foot 07/2015  . Chronic diastolic heart failure ([place])    per Nursing facility  . Chronic kidney disease    CKD stage 3  . CVA (cerebral vascular accident) ([place]) 2015   denies residual on 08/15/2015  . GERD (gastroesophageal reflux disease)   . Hepatitis C    states he was diagnosed in 2007 or 2007 while living in [place], [place]  . Hepatitis C   . Hyperlipidemia   . Hypertension   . Ischemia of foot 05/31/2016  . Osteomyelitis ([place])    per Nursing Facility  . Peripheral neuropathy   . Pneumonia X 1  . PVD (peripheral vascular disease) ([place])   . Type II diabetes mellitus ([place])   . 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 M presents with SOB. Trop elevated to 2819. To begin heparin for ACS. No AC PTA. Patient now reporting dark, tarry stools over the last few week. HGb 7.6, cardiology plans to transfuse with 2 unit  PRBCs and cath after more stable. Continue heparin for now, unless any active signs of bleeding.  HL 0.26, subtherapeutic  Goal of Therapy:  Heparin level 0.3-0.7 units/ml Monitor platelets by anticoagulation protocol: Yes   Plan:  Increase heparin infusion to 1450 units/hr Will f/u heparin level in 6 hours Daily heparin level and CBC  Donna Christen Sherel Fennell, PharmD, MBA, BCGP Clinical Pharmacist  03/20/2020,7:22 PM

## 2020-03-20 NOTE — ED Notes (Signed)
Pt reports having chest pain for one week.

## 2020-03-20 NOTE — TOC Initial Note (Signed)
Transition of Care [facility]) - Initial/Assessment Note   Patient Details  Name: Cory Weber MRN: [id number] Date of Birth: Jul 31, 1950  Transition of Care [facility]) CM/SW Contact:    Sherie Don, LCSW Phone Number: 03/20/2020, 9:48 AM  Clinical Narrative: Patient is a 70 year old who was admitted heart failure and NSTEMI. Readmission checklist completed due to high readmission score.  CSW unable to reach patient to complete assessment, so CSW spoke with Jackelyn Poling at [facility] as patient has been a resident there for approximately for 6 years. Per Jackelyn Poling, patient recently started PT 3-4 days/week as patient has two prosthetic legs. Patient is primarily in a wheelchair at baseline, but can walk with a walker and a 2-person assist with the prosthetics. Patient requires some assistance with ADLs, but can complete bathing and dressing mostly independently if everything is set up for the patient. [facility] provides transportation to appointments and will schedule the patient's hospital follow-up appointment with his PCP. Patient will need a new FL2 prior to returning to the facility. FL2 started. TOC to follow.  Expected Discharge Plan: Long Term Nursing Home Barriers to Discharge: Continued Medical Work up  Patient Goals and CMS Choice Patient states their goals for this hospitalization and ongoing recovery are:: Return to [facility].gov Compare Post Acute Care list provided to:: Patient Choice offered to / list presented to : Patient  Expected Discharge Plan and Services Expected Discharge Plan: Long Term Nursing Home In-house Referral: Clinical Social Work Discharge Planning Services: NA Post Acute Care Choice: Nursing Home Living arrangements for the past 2 months: [place]             DME Arranged: N/A DME Agency: NA HH Arranged: NA [place] Agency: NA  Prior Living Arrangements/Services Living arrangements for the past 2 months: [place] Lives with::  Facility Resident Patient language and need for interpreter reviewed:: Yes Do you feel safe going back to the place where you live?: Yes      Need for Family Participation in Patient Care: Yes (Comment) Care giver support system in place?: Yes (comment) Current home services: DME,Home PT [facility] provides PT onsite. DME: wheelchair, walker) Criminal Activity/Legal Involvement Pertinent to Current Situation/Hospitalization: No - Comment as needed  Emotional Assessment Appearance:: Appears stated age Orientation: : Oriented to Self,Oriented to Place,Oriented to  Time,Oriented to Situation Alcohol / Substance Use: Not Applicable Psych Involvement: No (comment)  Admission diagnosis:  Heart failure (HCC) [I50.9] NSTEMI (non-ST elevated myocardial infarction) [facility]) [I21.4] Patient Active Problem List   Diagnosis Date Noted  . Heart failure ([place]) 03/20/2020  . NSTEMI (non-ST elevated myocardial infarction) ([place]) 03/20/2020  . Pneumonia due to COVID-19 virus 02/24/2019  . Acute respiratory failure with hypoxia ([place]) 02/20/2019  . COVID-19 virus detected 02/20/2019  . UTI (urinary tract infection) 02/20/2019  . DNR (do not resuscitate) 02/20/2019  . PAD (peripheral artery disease) ([place]) 08/22/2016  . Osteomyelitis of foot ([place]) 07/08/2016  . S/P transmetatarsal amputation of foot, right ([place]) 06/26/2016  . Constipation due to opioid therapy 06/15/2016  . Physical deconditioning 06/10/2016  . Wet gangrene ([place]) 06/10/2016  . Chronic diastolic CHF (congestive heart failure) ([place]) 06/10/2016  . Ischemic pain of right foot 06/04/2016  . Ischemia of foot 05/31/2016  . 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 Dyslipidemia associated with type 2 diabetes mellitus ([place]) 01/21/2016  . Uncontrolled type II diabetes with peripheral autonomic neuropathy ([place]) 01/17/2016  . Weight  gain 12/20/2015  . Edema of right lower extremity 12/20/2015  .  Cough 11/26/2015  . GERD (gastroesophageal reflux disease) 10/08/2015  . Amputation of left lower extremity below knee ([place]) 08/21/2015  . Phantom limb pain ([place])   . Anemia of chronic disease   . BPH (benign prostatic hyperplasia)   . ETOH abuse   . PVD (peripheral vascular disease) ([place])   . Chronic hepatitis C without hepatic coma ([place])   . History of CVA (cerebrovascular accident) without residual deficits   . Hyponatremia   . Chronic kidney disease, stage 3 ([place]) 07/18/2015  . Essential hypertension 07/18/2015  . S/P lumbar spinal fusion 11/25/2013  . Melena 12/03/2011  . Hemiparesthesia 02/03/2011  . Polysubstance abuse ([place]) 02/03/2011  . Tobacco abuse 02/03/2011   PCP:  Iona Beard, MD Pharmacy:   [facility] [place] [place] [address] Phone: [phone] Fax: [phone]  Readmission Risk Interventions Readmission Risk Prevention Plan 03/20/2020  Transportation Screening Complete  PCP or Specialist Appt within 3-5 Days Not Complete  Not Complete comments Patient resides at [facility] and the facility schedules hospital follow up appointments.  [facility] or Home Care Consult Complete  Social Work Consult for [place] Planning/Counseling Complete  Palliative Care Screening Not Applicable  Medication Review Press photographer) Complete  Some recent data might be hidden

## 2020-03-20 NOTE — ED Notes (Addendum)
Tolerating blood product well.

## 2020-03-20 NOTE — ED Notes (Signed)
No stool noted.   

## 2020-03-20 NOTE — Plan of Care (Signed)
Contacted by [facility] ED for patient presenting with dyspnea.  In brief the patient has a history of HFpEF, Uncontrolled stage II HTN, DM, PVD ( Bilateral BKA), stroke who presented for dyspnea.  Found to have Hypertensive emergency with pulmonary edema, AKI, elevated BNP, and troponin initially at 2800.  EKG with nonspecific T-wave flattening in inferior leads. With sinus tachycardia.  03/2018 TTE with preserved LVEF and without signifcant valvular disease. 03/2018 NMST without inducible ischemia.  Patient's presentation is most consistent with AHRF, AKI secondary to hypertensive emergency. Patient's presentation inconsistent with MI at this time. Suspect troponin elevation is related to acute myocardial injury vs. Less likely  Type II MI process.  Given HTN, and likely alternate explanation, would not treat as ACS at this time.  Recommend: - 80mg  IV lasix now with a goal net negative at least 2 L in the next 6 hours. - Nitro drip for afterload reduction. - TTE - Will place on the cardiology transfer list. Onsite consult team should re-evaluate the patient in the AM, if he is still at [facility].

## 2020-03-20 NOTE — ED Notes (Signed)
Tolerating blood product well.

## 2020-03-20 NOTE — Progress Notes (Signed)
*  PRELIMINARY RESULTS* Echocardiogram 2D Echocardiogram has been performed.  Cory Weber 03/20/2020, 10:56 AM

## 2020-03-20 NOTE — Consult Note (Signed)
Cardiology Consult note   Patient ID: Cory Weber MRN: [id number]; DOB: 1950/06/04   Admission date: 03/20/2020  Primary Care Provider: Iona Beard, MD [facility] Cardiologist: Kate Sable, MD (Inactive)   [facility] Electrophysiologist:  None    Chief Complaint: Chest pain/short of breath  Patient Profile:   Cory Weber is a 70 y.o. male with  with past medical history of Hepatitis C, HTN, HLD, IDDM, prior CVA, and PVD (s/p left fem-pop bypass which required left BKA in 08/2015 due to maggot infestation and right BKA.   History of Present Illness:   Cory Weber was seen 03/2018 for atypical chest pain,Lexiscan Myoview for ordered for ischemic evaluation. This showed a medium sized, mild intensity, fixed inferior/inferoseptal defect that was most consistent with soft tissue attenuation versus scar and no definite ischemic territories. Was read as a high risk study due to calculated EF of 33%. An echocardiogram was obtained for further assessment and showed a preserved EF of 55-60% with no regional WMA and no significant valvular abnormalities.  Hasn't been seen by [place] since 09/2018-telemedicine.  Patient now comes in with dyspnea, Hypertensive emergency, pulmonary edema, AKI, and elevated troponin 2800 and nonspecific EKG changes. Given IV lasix 80 mg and IV NTG. Patient lives at [facility] and just started PT again after having covid19 last year. Gets short of breath and chest tightness with therapy. Last night he couldn't lay down because of shortness of breath and chest burning/tightnes and diaphoresis. Still a little uncomfortable in his chest. On IV heparin/NTG. Says he gets his meds daily, has lost some weight.    Past Medical History:  Diagnosis Date  . Anemia   . Asthma   . BPH (benign prostatic hyperplasia)   . Cellulitis and abscess of foot 07/2015  . Chronic diastolic heart failure ([place])    per Nursing facility  . Chronic kidney disease    CKD stage 3  .  CVA (cerebral vascular accident) ([place]) 2015   denies residual on 08/15/2015  . GERD (gastroesophageal reflux disease)   . Hepatitis C    states he was diagnosed in 2007 or 2007 while living in [place], [place]  . Hepatitis C   . Hyperlipidemia   . Hypertension   . Ischemia of foot 05/31/2016  . Osteomyelitis ([place])    per Nursing Facility  . Peripheral neuropathy   . Pneumonia X 1  . PVD (peripheral vascular disease) ([place])   . Type II diabetes mellitus ([place])   . 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 ABDOMINAL AORTOGRAM N/A 06/03/2016   Procedure: Abdominal Aortogram;  Surgeon: Waynetta Sandy, MD;  Location: [place] CV [facility];  Service: Cardiovascular;  Laterality: N/A;  . AMPUTATION Left 08/16/2015   Procedure: LEFT BELOW THE KNEE AMPUTATION ;  Surgeon: Serafina Mitchell, MD;  Location: [place];  Service: Vascular;  Laterality: Left;  . AMPUTATION Right 08/22/2016   Procedure: AMPUTATION BELOW KNEE-RIGHT;  Surgeon: Waynetta Sandy, MD;  Location: [place];  Service: Vascular;  Laterality: Right;  . BACK SURGERY    . BELOW KNEE LEG AMPUTATION Left 08/16/2015  . BIOPSY N/A 09/03/2012   Procedure: BIOPSY;  Surgeon: Daneil Dolin, MD;  [facility]: [facility];  Service: Endoscopy;  Laterality: N/A;  gastric and gastric mucosa  . BIOPSY N/A 12/03/2012   Procedure: BIOPSY;  Surgeon: Daneil Dolin, MD;  [facility]: [facility];  Service: Endoscopy;  Laterality: N/A;  .  COLONOSCOPY WITH PROPOFOL N/A 09/03/2012   JYN:WGNFAOZ polyp-removed as outlined above. Prominent internal hemorrhoids. Tubular adenoma  . ESOPHAGOGASTRODUODENOSCOPY (EGD) WITH PROPOFOL N/A 09/03/2012   HYQ:MVHQIO hernia. Gastric diverticulum. Gastric ulcers with associated erosions. Duodenal erosions. Status post gastric biopsy. H.PYLORI gastritis   . ESOPHAGOGASTRODUODENOSCOPY (EGD) WITH PROPOFOL N/A 12/03/2012   Dr. Gala Romney: gastric diverticulum, gastric erosions and scar. Previously  noted gastric ulcer completed healed. Biopsy without H.pylori.   . ESOPHAGOGASTRODUODENOSCOPY (EGD) WITH PROPOFOL N/A 01/23/2016   Procedure: ESOPHAGOGASTRODUODENOSCOPY (EGD) WITH PROPOFOL;  Surgeon: Doran Stabler, MD;  [facility]: [facility];  Service: Gastroenterology;  Laterality: N/A;  . I & D EXTREMITY Right 07/11/2016   Procedure: DELAY PRIMARY CLOSURE FOOT AMPUTATION;  Surgeon: Edrick Kins, DPM;  Location: [place];  Service: Podiatry;  Laterality: Right;  . LIVER BIOPSY  2005   Done in [place], [place]. Chronic hepatitis with mild periportal inflammation, lobular unicellular necrosis and portal fibrosis. Grade 2, stage 1-2.  . LOWER EXTREMITY ANGIOGRAPHY Right 06/03/2016   Procedure: Lower Extremity Angiography;  Surgeon: Waynetta Sandy, MD;  Location: [place] [facility];  Service: Cardiovascular;  Laterality: Right;  . MAXIMUM ACCESS (MAS)POSTERIOR LUMBAR INTERBODY FUSION (PLIF) 1 LEVEL N/A 11/25/2013   Procedure: FOR MAXIMUM ACCESS (MAS) POSTERIOR LUMBAR INTERBODY FUSION (PLIF) 1 LEVEL;  Surgeon: Eustace Moore, MD;  Location: [place] [facility];  Service: Neurosurgery;  Laterality: N/A;  FOR MAXIMUM ACCESS (MAS) POSTERIOR LUMBAR INTERBODY FUSION (PLIF) 1 LEVEL LUMBAR 3-4  . PERIPHERAL VASCULAR ATHERECTOMY Right 06/03/2016   Procedure: Peripheral Vascular Atherectomy;  Surgeon: Waynetta Sandy, MD;  Location: [place] [facility];  Service: Cardiovascular;  Laterality: Right;  SFA WITH STENT  . PERIPHERAL VASCULAR CATHETERIZATION Left 08/01/2015   Procedure: Lower Extremity Angiography;  Surgeon: Serafina Mitchell, MD;  Location: [place] [facility];  Service: Cardiovascular;  Laterality: Left;  . PERIPHERAL VASCULAR CATHETERIZATION N/A 08/01/2015   Procedure: Abdominal Aortogram;  Surgeon: Serafina Mitchell, MD;  Location: [place] [facility];  Service: Cardiovascular;  Laterality: N/A;  . PERIPHERAL VASCULAR CATHETERIZATION N/A 08/08/2015   Procedure: Abdominal Aortogram w/Lower  Extremity;  Surgeon: Serafina Mitchell, MD;  Location: [place] [facility];  Service: Cardiovascular;  Laterality: N/A;  . PERIPHERAL VASCULAR CATHETERIZATION Left 08/08/2015   Procedure: Peripheral Vascular Balloon Angioplasty;  Surgeon: Serafina Mitchell, MD;  Location: [place] [facility];  Service: Cardiovascular;  Laterality: Left;  left popiteal artery, left peronealtrunk, left post tibial  . POLYPECTOMY N/A 09/03/2012   Procedure: POLYPECTOMY;  Surgeon: Daneil Dolin, MD;  [facility];  Service: Endoscopy;  Laterality: N/A;  cecal polyp  . TONSILLECTOMY    . TRANSMETATARSAL AMPUTATION Right 06/13/2016   Procedure: RIGHT TRANSMETATARSAL AMPUTATION;  Surgeon: Waynetta Sandy, MD;  Location: [place];  Service: Vascular;  Laterality: Right;  . TRANSMETATARSAL AMPUTATION Right 07/10/2016   Procedure: REVISION RIGHT TRANSMETATARSAL AMPUTATION;  Surgeon: Waynetta Sandy, MD;  Location: [place];  Service: Vascular;  Laterality: Right;     Medications Prior to Admission: Prior to Admission medications   Medication Sig Start Date End Date Taking? Authorizing Provider  carvedilol (COREG) 3.125 MG tablet Take 1 tablet (3.125 mg total) by mouth 2 (two) times daily with a meal. Patient taking differently: Take 6.25 mg by mouth 2 (two) times daily with a meal. 08/10/15  Yes Mikhail, Velta Addison, DO  clopidogrel (PLAVIX) 75 MG tablet Take 1 tablet (75 mg total) by mouth daily with breakfast. 06/05/16  Yes  Dhungel, Nishant, MD  acetaminophen (TYLENOL) 325 MG tablet Take 2 tablets (650 mg total) by mouth every 6 (six) hours as needed for mild pain or headache (fever >/= 101). 02/24/19   Roxan Hockey, MD  amLODipine (NORVASC) 5 MG tablet Take 5 mg by mouth daily. Patient not taking: Reported on 10/21/2019    [provider]  aspirin EC 81 MG tablet Take 1 tablet (81 mg total) by mouth daily with breakfast. 02/24/19   Roxan Hockey, MD  baclofen (LIORESAL) 10 MG tablet Take 1 tablet (10 mg  total) by mouth 3 (three) times daily as needed for muscle spasms. Patient taking differently: Take 10 mg by mouth 2 (two) times daily. For shoulder spasm 02/25/19   Roxan Hockey, MD  Baclofen 5 MG TABS Take 15 mg by mouth daily. For muscle spasms    [provider]  Cholecalciferol (VITAMIN D3) 2000 units TABS Take 1 tablet by mouth daily. Patient not taking: Reported on 10/21/2019    [provider]  cyanocobalamin 1000 MCG tablet Take 500 mcg by mouth daily.     [provider]  DULoxetine (CYMBALTA) 30 MG capsule Take 1 capsule by mouth daily. 10/20/19   [provider]  ferrous sulfate 325 (65 FE) MG tablet Take 325 mg by mouth 3 (three) times daily with meals.    [provider]  gabapentin (NEURONTIN) 600 MG tablet Take 1 tablet (600 mg total) by mouth 3 (three) times daily. Patient not taking: Reported on 10/21/2019 07/15/16   Domenic Polite, MD  insulin aspart (NOVOLOG) 100 UNIT/ML injection Inject 5 Units into the skin 3 (three) times daily after meals. Patient taking differently: Inject 8 Units into the skin 3 (three) times daily with meals.  07/15/16   Domenic Polite, MD  insulin glargine (LANTUS) 100 UNIT/ML injection Inject 0.26 mLs (26 Units total) into the skin at bedtime. Patient taking differently: Inject 30 Units into the skin at bedtime.  02/24/19   Roxan Hockey, MD  lactulose (CHRONULAC) 10 GM/15ML solution Take 15 mLs by mouth 3 (three) times daily. 08/01/19   [provider]  Lidocaine 4 % PTCH Apply 1 patch topically daily. Apply 1 patch to both stumps once daily    [provider]  lisinopril (ZESTRIL) 10 MG tablet Take 1 tablet (10 mg total) by mouth daily. 02/24/19   Roxan Hockey, MD  Melatonin 5 MG TABS Take 1 tablet by mouth at bedtime.    [provider]  metFORMIN (GLUCOPHAGE) 850 MG tablet Take 850 mg by mouth 2 (two) times daily. 10/14/19   [provider]  omeprazole (PRILOSEC) 20 MG  capsule Take 1 capsule (20 mg total) by mouth daily. Patient not taking: Reported on 10/21/2019 02/24/19 02/24/20  Roxan Hockey, MD  pantoprazole (PROTONIX) 20 MG tablet Take 20 mg by mouth daily. 09/26/19   [provider]  polyethylene glycol (MIRALAX / GLYCOLAX) 17 g packet Take 17 g by mouth daily.    [provider]  pregabalin (LYRICA) 100 MG capsule Take 1 capsule by mouth at bedtime. 10/20/19   [provider]  pregabalin (LYRICA) 75 MG capsule Take 1 capsule by mouth 2 (two) times daily. 10/20/19   [provider]  rosuvastatin (CRESTOR) 5 MG tablet Take 5 mg by mouth daily. 09/16/19   [provider]  senna-docusate (SENOKOT-S) 8.6-50 MG tablet Take 2 tablets by mouth at bedtime. 02/25/19   Roxan Hockey, MD  simvastatin (ZOCOR) 20 MG tablet Take 20 mg  by mouth daily.  Patient not taking: Reported on 10/21/2019    [provider]  tamsulosin (FLOMAX) 0.4 MG CAPS capsule Take 1 capsule (0.4 mg total) by mouth daily. 08/10/15   Mikhail, Velta Addison, DO  torsemide (DEMADEX) 20 MG tablet Take 2 tablets (40 mg total) by mouth daily. 09/22/18 10/21/19  Strader, Fransisco Hertz, PA-C  vitamin C (ASCORBIC ACID) 500 MG tablet Take 500 mg by mouth 2 (two) times daily.    [provider]  zinc sulfate 220 (50 Zn) MG capsule Take 1 capsule (220 mg total) by mouth daily. Patient not taking: Reported on 10/21/2019 02/25/19   Roxan Hockey, MD     Allergies:    Allergies  Allergen Reactions  . No Known Allergies     Social History:   Social History   Socioeconomic History  . Marital status: Divorced    Spouse name: Not on file  . Number of children: 1  . Years of education: Not on file  . Highest education level: Not on file  Occupational History  . Occupation: disabled  Tobacco Use  . Smoking status: Former Smoker    Packs/day: 1.00    Years: 47.00    Pack years: 47.00    Types: Cigarettes    Quit date: 07/13/2015    Years since quitting:  4.6  . Smokeless tobacco: Never Used  Vaping Use  . Vaping Use: Never used  Substance and Sexual Activity  . Alcohol use: No    Alcohol/week: 0.0 standard drinks    Comment:  none 08/15/2015 "stopped drinking 3-4 years ago"  . Drug use: No    Comment: 08/15/2015 "last used cocaine 3-4 months ago"  . Sexual activity: Never    Birth control/protection: Condom  Other Topics Concern  . Not on file  Social History Narrative  . Not on file   Social Determinants of Health   Financial Resource Strain: Not on file  Food Insecurity: Not on file  Transportation Needs: Not on file  Physical Activity: Not on file  Stress: Not on file  Social Connections: Not on file  Intimate Partner Violence: Not on file    Family History:   The patient's family history includes Breast cancer in his mother and sister; Diabetes in his father and sister; Heart disease in his father; Heart failure in his mother; Hyperlipidemia in his father; Hypertension in his father and sister. There is no history of Colon cancer or Liver disease.    ROS:  Please see the history of present illness.  Review of Systems  Constitutional: Positive for diaphoresis and malaise/fatigue.  HENT: Negative.   Cardiovascular: Positive for chest pain and dyspnea on exertion.  Respiratory: Positive for shortness of breath and sleep disturbances due to breathing.   Endocrine: Negative.   Hematologic/Lymphatic: Negative.   Musculoskeletal: Positive for muscle weakness.  Gastrointestinal: Negative.   Genitourinary: Negative.   Neurological: Negative.   All other ROS reviewed and negative.     Physical Exam/Data:   Vitals:   03/20/20 0640 03/20/20 0645 03/20/20 0650 03/20/20 0655  BP: 137/76 (!) 160/74 (!) 154/69 (!) 157/72  Pulse: 91 94 91 92  Resp: 16 14 14 13   Temp:      TempSrc:      SpO2: 100% 100% 100% 100%  Weight:       No intake or output data in the 24 hours ending 03/20/20 0813 Last 3 Weights 03/20/2020 10/23/2019  10/21/2019  Weight (lbs) 275 lb 9.2 oz 272  lb 0.8 oz 272 lb  Weight (kg) 125 kg 123.4 kg 123.378 kg     Body mass index is 43.16 kg/m.  General:  Obese,ongoing chest pain HEENT: normal Lymph: no adenopathy Neck: increased JVD Endocrine:  No thryomegaly Vascular: No carotid bruits; FA pulses 2+ bilaterally without bruits  Cardiac:  normal S1, S2; RRR; no murmur  Lungs: decreased breath sounds with some wheezing and crackles at bases Abd: soft, nontender, no hepatomegaly  Ext: bilateral BKA-some edema upper thighs Musculoskeletal:  No deformities, BUE and BLE strength normal and equal Skin: warm and dry  Neuro:  CNs 2-12 intact, no focal abnormalities noted Psych:  Normal affect    EKG:  The ECG that was done   was personally reviewed and demonstrates NSR 98/m with minimal ST elevation III and ant  Relevant CV Studies:  NST: 03/2018  No diagnostic ST segment changes to indicate ischemia.  Medium sized, mild intensity, fixed inferior/inferoseptal defect that is most consistent with soft tissue attenuation versus scar. No definite ischemic territories.  This is a high risk study based on reduced LVEF. Consider echocardiogram for further assessment.  Nuclear stress EF: 33%.   Echocardiogram: 03/2018 IMPRESSIONS    1. The left ventricle has normal systolic function, with an ejection fraction of 55-60%. The cavity size was normal. There is mildly increased left ventricular wall thickness. Left ventricular diastolic Doppler parameters are indeterminate Indeterminent  filling pressures.  2. The mitral valve is normal in structure. Moderate thickening of the mitral valve leaflet. No evidence of mitral valve stenosis.  3. The aortic valve has an indeterminant number of cusps. Cannot exclude bicuspid aortic valve. Moderately thickened valve  4. The aortic root is normal in size and structure.  5. The right ventricle has normal systolic function. The cavity was normal. There is no  increase in right ventricular wall thickness.  6. The tricuspid valve is normal in structure.  7. Pulmonary hypertension is indeterminant, inadequate TR jet.  8. The interatrial septum was not well visualized.    Laboratory Data:  High Sensitivity Troponin:   Recent Labs  Lab 03/20/20 0133 03/20/20 0324  TROPONINIHS 2,819* 3,337*      Chemistry Recent Labs  Lab 03/20/20 0324  NA 134*  K 4.2  CL 98  CO2 33*  GLUCOSE 308*  BUN 14  CREATININE 1.39*  CALCIUM 8.5*  GFRNONAA 56*  ANIONGAP 3*    Recent Labs  Lab 03/20/20 0324  PROT 8.9*  ALBUMIN 3.2*  AST 26  ALT 10  ALKPHOS 51  BILITOT 0.3   HematologyNo results for input(s): WBC, RBC, HGB, HCT, MCV, MCH, MCHC, RDW, PLT in the last 168 hours. BNP Recent Labs  Lab 03/20/20 0119  BNP 213.0*    DDimer No results for input(s): DDIMER in the last 168 hours.   Radiology/Studies:  DG Chest Port 1 View  Result Date: 03/20/2020 CLINICAL DATA:  Shortness of breath EXAM: PORTABLE CHEST 1 VIEW COMPARISON:  October 21, 2019 FINDINGS: The heart size and mediastinal contours are within normal limits. Mild diffuse interstitial opacities are seen throughout both lungs. No pleural effusion. No acute osseous abnormality. IMPRESSION: Mildly increased interstitial markings throughout both lungs which could be due to interstitial edema or infectious etiology. Electronically Signed   By: Prudencio Pair M.D.   On: 03/20/2020 01:37     Assessment and Plan:   NSTEMI with ongoing chest pain and troponins over 3000 on IV NTG/Heparin. Will titrate NTG and give Morphine. Notes say  CKD stage 4 but Crt 2.11 on admission and now 1.39 with diuresis. [facility]wait[facility]  Hypertensive emergency-BP coming down  Acute pulmonary edema LVEF 55-60% echo 03/2018 on coreg/lisinopril/norvasc/demadex  HLD on Crestor  PVD left fem-pop required left BKA 2017 due to maggot infestation and rBKA 08/2016  History of CVA on ASA, Plavix,  statin  IDDM--will hold metformin for possible cath    Risk Assessment/Risk Scores:     TIMI Risk Score for Unstable Angina or Non-ST Elevation MI:   The patient's TIMI risk score is  , which indicates a  % risk of all cause mortality, new or recurrent myocardial infarction or need for urgent revascularization in the next 14 days.  New York Heart Association (NYHA) Functional Class NYHA Class IV     Severity of Illness: The appropriate patient status for this patient is OBSERVATION. Observation status is judged to be reasonable and necessary in order to provide the required intensity of service to ensure the patient's safety. The patient's presenting symptoms, physical exam findings, and initial radiographic and laboratory data in the context of their medical condition is felt to place them at decreased risk for further clinical deterioration. Furthermore, it is anticipated that the patient will be medically stable for discharge from the hospital within 2 midnights of admission. The following factors support the patient status of observation.   " The patient's presenting symptoms include chest pain/dypsnea/diaphoresis. " The physical exam findings include edema/rales. " The initial radiographic and laboratory data are NSTEMI/CHF     For questions or updates, please contact [facility] Please consult [email or web address] for contact info under     Signed, Ermalinda Barrios, PA-C  03/20/2020 8:13 AM   Attending note Patient seen and discussed with PA Bonnell Public, I agree with her documentaiton. 70 yo male history of HTN, HL, DM2, CVA, PAD with left fem-pop bypass which required left BKA in 08/2015 and right BKA 08/2016, presents with chest pain and SOB  In ER hypoxic initially on NRB, quickly downtitrated back to his baseline 2L [place].   ER vitals: p 117 bp 172/102 100% COVID neg INR 1 BNP 213 Lactic acid 2.1--> 1.6  K 4.2 Cr 1.39  Trop 2819-->3337 CXR + edema vs infection EKG SR, no specific  ischemic changes   Patient presents with NSTEMI with evidence of clinical heart failiure. Initial medical therapy with ASA, plavix (on at home), coreg, no ACE/ARB given history of prior renal dysfunction and upcoming cath, crestor, hep gtt, nitro gtt. Significant HTN on presnetation improved on NG drip.   CBC was late in coming back, Hgb 7.6. On talking with patient he reports some dark tarry stools over the last few weeks. Given this history we will postpone cath. In setting of NSTEMI I have written for 2 units pRBCs and discussed patient with internal medicine who will accept patient as primary. Have continued IV heparin for now, if signs of active bleeding would need to d/c, would continue his DAPT as well at this time. I think if Hgb is stable post transfusion would still plan on cath later this admission. Will give IV lasix 60mg  x 1 after 1st unit of pRBCs  Also patient reports recent outpatient diagnosis of UTI, reports he was on IM ceftriaxone. Defer any inpatient regimen to medicine team.   Carlyle Dolly MD

## 2020-03-20 NOTE — ED Notes (Signed)
Peri-care done, no stool noted for occult.

## 2020-03-20 NOTE — ED Notes (Signed)
800 ml of urine. Canister changed out

## 2020-03-20 NOTE — H&P (Signed)
History and Physical  [name]  Cory Weber NID:[id number] DOB: 12/28/1950 DOA: 03/20/2020  PCP: Iona Beard, MD  Patient coming from: [place] [facility]  Level of care: Progressive  I have personally briefly reviewed patient's old medical records in [place]  Chief Complaint: shortness of breath   HPI: Cory Weber is a 70 y.o. male with medical history significant chronic kidney disease, CVA, hepatitis C, bilateral BKA, hyperlipidemia, BPH, GERD, anemia, asthma and other history detailed below presented to ED from [facility] with complaints of acute onset of shortness of breath.  Pt is oxygen dependent and normally wears 2L at baseline.  His pulse ox dropped into the 70s and he was placed on a nonrebreather when he arrived.  He has no cough or fever.  He had covid 2 months ago.  He denies chest pain, fever, diarrhea.  He does report that he has had occasional black stool over last month.  He is having trouble lying flat due to severe shortness of breath. He denies chest pain now but did have chest pain intermittently several days ago. He lives at a SNF and receives medications daily.  He reports no loss of appetite.   ED Course: Patient arrived with a pulse ox of 100% on nonrebreather and subsequently placed on 2 L nasal cannula.  His blood pressure was markedly elevated at 172/102, pulse 118.  His chest x-ray showed tiny left pleural effusion and there was concern about pulmonary edema.  His pulse ox dropped to 88% on 2 L and increased to 4 L oxygen.  His BNP was elevated at 213.0.  His high-sensitivity troponin was 2819.  Cardiology was consulted and they saw the patient and he is now on heparin for NSTEMI.  He is also noted to have a hemoglobin of 7.6 and reported having some melanotic stools several days ago.  Cardiology asked for medical admission and they will consult.  They recommended patient transfer to [name] for cath consideration later this week.  Patient is being  transfused 2 units packed red blood cell.  Patient is being diuresed with IV Lasix.  He is feeling better.  Patient to admit to a progressive bed at [facility].  Review of Systems: Review of Systems  Constitutional: Positive for malaise/fatigue. Negative for chills, diaphoresis and fever.  HENT: Negative for congestion, hearing loss, nosebleeds, sore throat and tinnitus.   Eyes: Negative.   Respiratory: Positive for shortness of breath. Negative for cough, hemoptysis, sputum production, wheezing and stridor.   Cardiovascular: Positive for palpitations. Negative for chest pain and leg swelling.  Gastrointestinal: Positive for melena. Negative for blood in stool and heartburn.  Genitourinary: Negative.   Musculoskeletal: Positive for myalgias.  Skin: Negative for itching and rash.  Neurological: Positive for weakness and headaches. Negative for sensory change, focal weakness and loss of consciousness.  Endo/Heme/Allergies: Negative.   Psychiatric/Behavioral: Negative.     Past Medical History:  Diagnosis Date  . Anemia   . Asthma   . BPH (benign prostatic hyperplasia)   . Cellulitis and abscess of foot 07/2015  . Chronic diastolic heart failure ([place])    per Nursing facility  . Chronic kidney disease    CKD stage 3  . CVA (cerebral vascular accident) ([place]) 2015   denies residual on 08/15/2015  . GERD (gastroesophageal reflux disease)   . Hepatitis C    states he was diagnosed in 2007 or 2007 while living in [place], [place]  . Hepatitis C   .  Hyperlipidemia   . Hypertension   . Ischemia of foot 05/31/2016  . Osteomyelitis ([place])    per Nursing Facility  . Peripheral neuropathy   . Pneumonia X 1  . PVD (peripheral vascular disease) ([place])   . Type II diabetes mellitus ([place])   . Wet gangrene ([place]) 06/10/2016    Past Surgical History:  Procedure Laterality Date  . ABDOMINAL AORTOGRAM N/A 06/03/2016   Procedure: Abdominal Aortogram;  Surgeon: Waynetta Sandy, MD;  Location: [place] [facility];  Service: Cardiovascular;  Laterality: N/A;  . AMPUTATION Left 08/16/2015   Procedure: LEFT BELOW THE KNEE AMPUTATION ;  Surgeon: Serafina Mitchell, MD;  Location: [place];  Service: Vascular;  Laterality: Left;  . AMPUTATION Right 08/22/2016   Procedure: AMPUTATION BELOW KNEE-RIGHT;  Surgeon: Waynetta Sandy, MD;  Location: [place];  Service: Vascular;  Laterality: Right;  . BACK SURGERY    . BELOW KNEE LEG AMPUTATION Left 08/16/2015  . BIOPSY N/A 09/03/2012   Procedure: BIOPSY;  Surgeon: Daneil Dolin, MD;  [facility]: [facility];  Service: Endoscopy;  Laterality: N/A;  gastric and gastric mucosa  . BIOPSY N/A 12/03/2012   Procedure: BIOPSY;  Surgeon: Daneil Dolin, MD;  Location: [facility];  Service: Endoscopy;  Laterality: N/A;  . COLONOSCOPY WITH PROPOFOL N/A 09/03/2012   KTG:YBWLSLH polyp-removed as outlined above. Prominent internal hemorrhoids. Tubular adenoma  . ESOPHAGOGASTRODUODENOSCOPY (EGD) WITH PROPOFOL N/A 09/03/2012   TDS:KAJGOT hernia. Gastric diverticulum. Gastric ulcers with associated erosions. Duodenal erosions. Status post gastric biopsy. H.PYLORI gastritis   . ESOPHAGOGASTRODUODENOSCOPY (EGD) WITH PROPOFOL N/A 12/03/2012   Dr. Gala Romney: gastric diverticulum, gastric erosions and scar. Previously noted gastric ulcer completed healed. Biopsy without H.pylori.   . ESOPHAGOGASTRODUODENOSCOPY (EGD) WITH PROPOFOL N/A 01/23/2016   Procedure: ESOPHAGOGASTRODUODENOSCOPY (EGD) WITH PROPOFOL;  Surgeon: Doran Stabler, MD;  [facility]: [facility];  Service: Gastroenterology;  Laterality: N/A;  . I & D EXTREMITY Right 07/11/2016   Procedure: DELAY PRIMARY CLOSURE FOOT AMPUTATION;  Surgeon: Edrick Kins, DPM;  Location: [place];  Service: Podiatry;  Laterality: Right;  . LIVER BIOPSY  2005   Done in [place], [place]. Chronic hepatitis with mild periportal inflammation, lobular unicellular necrosis and portal fibrosis. Grade 2, stage 1-2.  . LOWER EXTREMITY ANGIOGRAPHY Right  06/03/2016   Procedure: Lower Extremity Angiography;  Surgeon: Waynetta Sandy, MD;  Location: [place] [facility];  Service: Cardiovascular;  Laterality: Right;  . MAXIMUM ACCESS (MAS)POSTERIOR LUMBAR INTERBODY FUSION (PLIF) 1 LEVEL N/A 11/25/2013   Procedure: FOR MAXIMUM ACCESS (MAS) POSTERIOR LUMBAR INTERBODY FUSION (PLIF) 1 LEVEL;  Surgeon: Eustace Moore, MD;  Location: [place] [facility];  Service: Neurosurgery;  Laterality: N/A;  FOR MAXIMUM ACCESS (MAS) POSTERIOR LUMBAR INTERBODY FUSION (PLIF) 1 LEVEL LUMBAR 3-4  . PERIPHERAL VASCULAR ATHERECTOMY Right 06/03/2016   Procedure: Peripheral Vascular Atherectomy;  Surgeon: Waynetta Sandy, MD;  Location: [place] [facility];  Service: Cardiovascular;  Laterality: Right;  SFA WITH STENT  . PERIPHERAL VASCULAR CATHETERIZATION Left 08/01/2015   Procedure: Lower Extremity Angiography;  Surgeon: Serafina Mitchell, MD;  Location: [place] [facility];  Service: Cardiovascular;  Laterality: Left;  . PERIPHERAL VASCULAR CATHETERIZATION N/A 08/01/2015   Procedure: Abdominal Aortogram;  Surgeon: Serafina Mitchell, MD;  Location: [place] [facility];  Service: Cardiovascular;  Laterality: N/A;  . PERIPHERAL VASCULAR CATHETERIZATION N/A 08/08/2015   Procedure: Abdominal Aortogram w/Lower Extremity;  Surgeon: Serafina Mitchell, MD;  Location: [place] [facility];  Service: Cardiovascular;  Laterality: N/A;  . PERIPHERAL VASCULAR CATHETERIZATION Left 08/08/2015   Procedure: Peripheral Vascular Balloon Angioplasty;  Surgeon: Serafina Mitchell, MD;  Location: [place] [facility];  Service: Cardiovascular;  Laterality: Left;  left popiteal artery, left peronealtrunk, left post tibial  . POLYPECTOMY N/A 09/03/2012   Procedure: POLYPECTOMY;  Surgeon: Daneil Dolin, MD;  [facility]: [facility];  Service: Endoscopy;  Laterality: N/A;  cecal polyp  . TONSILLECTOMY    . TRANSMETATARSAL AMPUTATION Right 06/13/2016   Procedure: RIGHT TRANSMETATARSAL AMPUTATION;  Surgeon: Waynetta Sandy, MD;  Location: [place];  Service: Vascular;  Laterality: Right;  . TRANSMETATARSAL AMPUTATION Right 07/10/2016   Procedure: REVISION RIGHT TRANSMETATARSAL AMPUTATION;  Surgeon: Waynetta Sandy, MD;  Location: [place];  Service: Vascular;  Laterality: Right;     reports that he quit smoking about 4 years ago. His smoking use included cigarettes. He has a 47.00 pack-year smoking history. He has never used smokeless tobacco. He reports that he does not drink alcohol and does not use drugs.  Allergies  Allergen Reactions  . No Known Allergies     Family History  Problem Relation Age of Onset  . Breast cancer Mother   . Heart failure Mother   . Diabetes Father   . Hypertension Father   . Heart disease Father   . Hyperlipidemia Father   . Diabetes Sister   . Hypertension Sister   . Breast cancer Sister   . Colon cancer Neg Hx   . Liver disease Neg Hx     Prior to Admission medications   Medication Sig Start Date End Date Taking? Authorizing Provider  acetaminophen (TYLENOL) 325 MG tablet Take 2 tablets (650 mg total) by mouth every 6 (six) hours as needed for mild pain or headache (fever >/= 101). 02/24/19  Yes Emokpae, Courage, MD  amLODipine (NORVASC) 5 MG tablet Take 5 mg by mouth daily.   Yes [provider]  baclofen (LIORESAL) 10 MG tablet Take 1 tablet (10 mg total) by mouth 3 (three) times daily as needed for muscle spasms. Patient taking differently: Take 10 mg by mouth 2 (two) times daily. For shoulder spasm 02/25/19  Yes Emokpae, Courage, MD  Baclofen 5 MG TABS Take 15 mg by mouth daily. For muscle spasms   Yes [provider]  carvedilol (COREG) 3.125 MG tablet Take 1 tablet (3.125 mg total) by mouth 2 (two) times daily with a meal. Patient taking differently: Take 6.25 mg by mouth 2 (two) times daily with a meal. 08/10/15  Yes Mikhail, Sutherland, DO  cefTRIAXone (ROCEPHIN) 1 g injection Inject 1 g into the muscle every 12 (twelve) hours.  5 day course to begin 03/18/20 and end 03/23/20 for UTI 03/18/20  Yes [provider]  clopidogrel (PLAVIX) 75 MG tablet Take 1 tablet (75 mg total) by mouth daily with breakfast. 06/05/16  Yes Dhungel, Nishant, MD  cyanocobalamin 1000 MCG tablet Take 500 mcg by mouth daily.    Yes [provider]  DULoxetine (CYMBALTA) 30 MG capsule Take 60 mg by mouth daily. 10/20/19  Yes [provider]  ferrous sulfate 325 (65 FE) MG tablet Take 325 mg by mouth 3 (three) times daily with meals.   Yes [provider]  gabapentin (NEURONTIN) 400 MG capsule Take 400-600 mg by mouth See admin instructions. 400mg  twice a day, give 600mg  at bedtime   Yes [provider]  insulin glargine (LANTUS) 100 UNIT/ML injection Inject 0.26 mLs (26 Units  total) into the skin at bedtime. Patient taking differently: Inject 30 Units into the skin at bedtime. 02/24/19  Yes Emokpae, Courage, MD  insulin lispro (HUMALOG) 100 UNIT/ML injection Inject 8 Units into the skin 3 (three) times daily before meals.   Yes [provider]  ipratropium-albuterol (DUONEB) 0.5-2.5 (3) MG/3ML SOLN Take 3 mLs by nebulization every 6 (six) hours as needed (SOB/ wheezing).   Yes [provider]  Lidocaine 4 % PTCH Apply 1 patch topically daily. Apply 1 patch to both stumps once daily   Yes [provider]  lisinopril (ZESTRIL) 10 MG tablet Take 1 tablet (10 mg total) by mouth daily. 02/24/19  Yes Emokpae, Courage, MD  Melatonin 5 MG TABS Take 1 tablet by mouth at bedtime.   Yes [provider]  metFORMIN (GLUCOPHAGE) 850 MG tablet Take 850 mg by mouth 2 (two) times daily. 10/14/19  Yes [provider]  pantoprazole (PROTONIX) 20 MG tablet Take 20 mg by mouth daily. 09/26/19  Yes [provider]  polyethylene glycol (MIRALAX / GLYCOLAX) 17 g packet Take 17 g by mouth daily.   Yes [provider]  rosuvastatin (CRESTOR) 5 MG tablet Take 5 mg by mouth daily.  09/16/19  Yes [provider]  senna-docusate (SENOKOT-S) 8.6-50 MG tablet Take 2 tablets by mouth at bedtime. 02/25/19  Yes Emokpae, Courage, MD  torsemide (DEMADEX) 20 MG tablet Take 2 tablets (40 mg total) by mouth daily. 09/22/18 10/21/19 Yes Strader, Fransisco Hertz, PA-C  vitamin C (ASCORBIC ACID) 500 MG tablet Take 500 mg by mouth 2 (two) times daily.   Yes [provider]  aspirin EC 81 MG tablet Take 1 tablet (81 mg total) by mouth daily with breakfast. Patient not taking: Reported on 03/20/2020 02/24/19   Roxan Hockey, MD  gabapentin (NEURONTIN) 600 MG tablet Take 1 tablet (600 mg total) by mouth 3 (three) times daily. Patient not taking: No sig reported 07/15/16   Domenic Polite, MD  insulin aspart (NOVOLOG) 100 UNIT/ML injection Inject 5 Units into the skin 3 (three) times daily after meals. Patient not taking: Reported on 03/20/2020 07/15/16   Domenic Polite, MD  omeprazole (PRILOSEC) 20 MG capsule Take 1 capsule (20 mg total) by mouth daily. 02/24/19 02/24/20  Roxan Hockey, MD  pregabalin (LYRICA) 75 MG capsule Take 1 capsule by mouth 2 (two) times daily. 10/20/19   [provider]  tamsulosin (FLOMAX) 0.4 MG CAPS capsule Take 1 capsule (0.4 mg total) by mouth daily. Patient not taking: Reported on 03/20/2020 08/10/15   Cristal Ford, DO    Physical Exam: Vitals:   03/20/20 0845 03/20/20 0900 03/20/20 0904 03/20/20 0908  BP: 117/63 (!) 158/66    Pulse: (!) 105 (!) 101    Resp: (!) 21 19    Temp:   97.9 F (36.6 C)   TempSrc:   Oral   SpO2: 99% 100% 99% 99%  Weight:        Constitutional: NAD, calm, comfortable Eyes: PERRL, lids and conjunctivae normal ENMT: Mucous membranes are moist. Posterior pharynx clear of any exudate or lesions.Normal dentition.  Neck: normal, supple, no masses, no thyromegaly Respiratory: clear to auscultation bilaterally, no wheezing, no crackles. Normal respiratory effort. No accessory muscle use.  Cardiovascular: normal s1, s2  sounds, no murmurs / rubs / gallops. No extremity edema. 2+ pedal pulses. No carotid bruits.  Abdomen: no tenderness, no masses palpated. No hepatosplenomegaly. Bowel sounds positive.  Musculoskeletal: no clubbing / cyanosis. No joint deformity upper  and lower extremities. Good ROM, no contractures. Normal muscle tone.  Skin: no rashes, lesions, ulcers. No induration Neurologic: CN 2-12 grossly intact. Sensation intact, DTR normal. Strength 5/5 in all 4.  Psychiatric: Normal judgment and insight. Alert and oriented x 3. Normal mood.   Labs on Admission: I have personally reviewed following labs and imaging studies  CBC: Recent Labs  Lab 03/20/20 0911  WBC 6.0  HGB 7.6*  HCT 25.5*  MCV 100.4*  PLT 962   Basic Metabolic Panel: Recent Labs  Lab 03/20/20 0324  NA 134*  K 4.2  CL 98  CO2 33*  GLUCOSE 308*  BUN 14  CREATININE 1.39*  CALCIUM 8.5*   GFR: Estimated Creatinine Clearance: 65.4 mL/min (A) (by C-G formula based on SCr of 1.39 mg/dL (H)). Liver Function Tests: Recent Labs  Lab 03/20/20 0324  AST 26  ALT 10  ALKPHOS 51  BILITOT 0.3  PROT 8.9*  ALBUMIN 3.2*   No results for input(s): LIPASE, AMYLASE in the last 168 hours. No results for input(s): AMMONIA in the last 168 hours. Coagulation Profile: Recent Labs  Lab 03/20/20 0119  INR 1.0   Cardiac Enzymes: No results for input(s): CKTOTAL, CKMB, CKMBINDEX, TROPONINI in the last 168 hours. BNP (last 3 results) No results for input(s): PROBNP in the last 8760 hours. HbA1C: No results for input(s): HGBA1C in the last 72 hours. CBG: Recent Labs  Lab 03/20/20 0049  GLUCAP 281*   Lipid Profile: No results for input(s): CHOL, HDL, LDLCALC, TRIG, CHOLHDL, LDLDIRECT in the last 72 hours. Thyroid Function Tests: No results for input(s): TSH, T4TOTAL, FREET4, T3FREE, THYROIDAB in the last 72 hours. Anemia Panel: No results for input(s): VITAMINB12, FOLATE, FERRITIN, TIBC, IRON, RETICCTPCT in the last 72  hours. Urine analysis:    Component Value Date/Time   COLORURINE STRAW (A) 03/20/2020 0600   APPEARANCEUR CLEAR 03/20/2020 0600   LABSPEC 1.010 03/20/2020 0600   PHURINE 5.0 03/20/2020 0600   GLUCOSEU 50 (A) 03/20/2020 0600   HGBUR NEGATIVE 03/20/2020 0600   BILIRUBINUR NEGATIVE 03/20/2020 0600   KETONESUR NEGATIVE 03/20/2020 0600   PROTEINUR 30 (A) 03/20/2020 0600   UROBILINOGEN 1.0 11/29/2013 1705   NITRITE NEGATIVE 03/20/2020 0600   LEUKOCYTESUR TRACE (A) 03/20/2020 0600    Radiological Exams on Admission: DG Chest Port 1 View  Result Date: 03/20/2020 CLINICAL DATA:  Shortness of breath EXAM: PORTABLE CHEST 1 VIEW COMPARISON:  October 21, 2019 FINDINGS: The heart size and mediastinal contours are within normal limits. Mild diffuse interstitial opacities are seen throughout both lungs. No pleural effusion. No acute osseous abnormality. IMPRESSION: Mildly increased interstitial markings throughout both lungs which could be due to interstitial edema or infectious etiology. Electronically Signed   By: Prudencio Pair M.D.   On: 03/20/2020 01:37   EKG: Independently reviewed.   Assessment/Plan Principal Problem:   NSTEMI (non-ST elevated myocardial infarction) ([place]) Active Problems:   Polysubstance abuse ([place])   Tobacco abuse   Melena   Chronic kidney disease, stage 3 (HCC)   Essential hypertension   Chronic hepatitis C without hepatic coma (HCC)   History of CVA (cerebrovascular accident) without residual deficits   Phantom limb pain (HCC)   Anemia of chronic disease   PVD (peripheral vascular disease) (HCC)   Amputation of left lower extremity below knee (HCC)   GERD (gastroesophageal reflux disease)   Edema of right lower extremity   Uncontrolled type II diabetes with peripheral autonomic neuropathy ([place])   Dyslipidemia associated with type  2 diabetes mellitus ([place])   Physical deconditioning   Anemia, chronic disease   Chronic diastolic CHF (congestive heart failure)  (HCC)   PAD (peripheral artery disease) ([place])   DNR (do not resuscitate)   Heart failure ([place])    1. NSTEMI - cardiology has evaluated him and he is chest pain free. He his on IV nitro and IV  Heparin.  Morphine ordered. They asked for TRH to attend admission and they will consult and follow closely. I confirmed with Estella Husk that a progressive bed at [facility] is appropriate.  2. Acute HF pEF - He presented with acute pulmonary edema - treated with IV lasix in the ED.  3. Hyperlipidemia - resume home rosuvastatin daily.  4. Hypertensive urgency - BP improving with diuresis, restarted home coreg,lisinopril, norvasc and demadex 5. Chronic hepatitis C - outpatient GI follow up for ongoing management.  6. History of CVA - without residual deficits, resume home meds.  7. GERD - protonix ordered for GI protection.  8. Poor IV access - Midline or PICC line order placed.  9. Type 2 DM with nephropathy - resume home basal and prandial insulin and SSI coverage with frequent CBG monitoring.   10. Anemia in CKD - Baseline Hg has historically been around 9.  Today he presents with Hg of 7.6.  He is being tranfused 2 units PRBC in setting of NSTEMI. WIll hemoccult stool.  Follow CBC closely. Follow Hg trend. If hemoccult positive may need GI involvement.  11. DNR - confirmed, present on admission, continue order in hospital.   DVT prophylaxis: IV heparin   Code Status: DNR   Family Communication: telephone call   Disposition Plan: admit to [facility] progressive   Consults called: cardiology   Admission status: INP  Level of care: Progressive Irwin Brakeman MD [facility] How to contact the [facility] Attending or Consulting provider [place] or covering provider during after hours [place], for this patient?  1. Check the care team in [facility] and look for a) attending/consulting TR[facility] provider listed and b) the [facility] team listed 2. Log into [email or web address] and use [place]'s universal password to access. If you do  not have the password, please contact the hospital operator. 3. Locate the [facility] provider you are looking for under [facility] and page to a number that you can be directly reached. 4. If you still have difficulty reaching the provider, please page the [facility] (Director on Call) for the Hospitalists listed on amion for assistance.   If 7PM-7AM, please contact night-coverage [email or web address] Password [email or web address]  03/20/2020, 11:11 AM

## 2020-03-20 NOTE — ED Notes (Signed)
Date and time results received: 03/20/20 0430 Test: HSTroponin Critical Value: 3337  Name of Provider Notified: Dr. Dayna Barker

## 2020-03-20 NOTE — ED Provider Notes (Signed)
Emergency Department Provider Note  I have reviewed the triage vital signs and the nursing notes.  HISTORY  Chief Complaint Shortness of Breath   HPI Cory Weber is a 69 y.o. male with history of diastolic heart failure (last echo in 2020 was reportedly normal), chronic kidney disease, CVA, hepatitis and bilateral BKA's the presents to the emergency department today with acute onset of dyspnea.  Patient did use his normal state of health about an hour prior to arrival.  States he had a sudden onset of shortness of breath.  When I noticed that he is diaphoretic he states that started around the same time.  He states associated no chest pain.  Patient states he does have a history of building up some fluid but is unsure if this the case recently.  Patient does stay in a facility and wears 2 L of oxygen at baseline.  No known fevers.  No significant cough.  Patient arrived on nonrebreather secondary to reported oxygens in the 70s even on the nonrebreather however is 100% here so we will switch back to his 2 L.  Patient does not know what causes this but is slightly improved at this time.   No other associated or modifying symptoms.    Past Medical History:  Diagnosis Date  . Anemia   . Asthma   . BPH (benign prostatic hyperplasia)   . Cellulitis and abscess of foot 07/2015  . Chronic diastolic heart failure ([place])    per Nursing facility  . Chronic kidney disease    CKD stage 3  . CVA (cerebral vascular accident) ([place]) 2015   denies residual on 08/15/2015  . GERD (gastroesophageal reflux disease)   . Hepatitis C    states he was diagnosed in 2007 or 2007 while living in [place], [place]  . Hepatitis C   . Hyperlipidemia   . Hypertension   . Ischemia of foot 05/31/2016  . Osteomyelitis ([place])    per Nursing Facility  . Peripheral neuropathy   . Pneumonia X 1  . PVD (peripheral vascular disease) ([place])   . Type II diabetes mellitus ([place])   . 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 Pneumonia due to COVID-19 virus 02/24/2019  . Acute respiratory failure with hypoxia ([place]) 02/20/2019  . COVID-19 virus detected 02/20/2019  . UTI (urinary tract infection) 02/20/2019  . DNR (do not resuscitate) 02/20/2019  . PAD (peripheral artery disease) ([place]) 08/22/2016  . Osteomyelitis of foot ([place]) 07/08/2016  . S/P transmetatarsal amputation of foot, right ([place]) 06/26/2016  . Constipation due to opioid therapy 06/15/2016  . Physical deconditioning 06/10/2016  . 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 Chronic diastolic CHF (congestive heart failure) ([place]) 06/10/2016  . Ischemic pain of right foot 06/04/2016  . Ischemia of foot 05/31/2016  . 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 Dyslipidemia associated with type 2 diabetes mellitus ([place]) 01/21/2016  . Uncontrolled type II diabetes with peripheral autonomic neuropathy ([place]) 01/17/2016  . Weight gain 12/20/2015  . Edema of right lower extremity 12/20/2015  . Cough 11/26/2015  . GERD (gastroesophageal reflux disease) 10/08/2015  . Amputation of left lower extremity below knee ([place]) 08/21/2015  . Phantom limb pain ([place])   . Anemia of chronic disease   . BPH (benign prostatic hyperplasia)   . ETOH abuse   . PVD (peripheral vascular disease) ([place])   .  Chronic hepatitis C without hepatic coma ([place])   . History of CVA (cerebrovascular accident) without residual deficits   . Hyponatremia   . Chronic kidney disease, stage 3 ([place]) 07/18/2015  . Essential hypertension 07/18/2015  . S/P lumbar spinal fusion 11/25/2013  . Melena 12/03/2011  . Hemiparesthesia 02/03/2011  . Polysubstance abuse ([place]) 02/03/2011  . Tobacco abuse 02/03/2011    Past Surgical History:  Procedure Laterality Date  . ABDOMINAL AORTOGRAM N/A 06/03/2016   Procedure: Abdominal Aortogram;  Surgeon: Waynetta Sandy, MD;  Location: [facility];  Service: Cardiovascular;  Laterality: N/A;  . AMPUTATION Left 08/16/2015   Procedure: LEFT BELOW THE KNEE AMPUTATION ;  Surgeon: Serafina Mitchell, MD;  Location: [facility];  Service: Vascular;  Laterality: Left;  . AMPUTATION Right 08/22/2016   Procedure: AMPUTATION BELOW KNEE-RIGHT;  Surgeon: Waynetta Sandy, MD;  Location: [facility];  Service: Vascular;  Laterality: Right;  . BACK SURGERY    . BELOW KNEE LEG AMPUTATION Left 08/16/2015  . BIOPSY N/A 09/03/2012   Procedure: BIOPSY;  Surgeon: Daneil Dolin, MD;  [facility];  Service: Endoscopy;  Laterality: N/A;  gastric and gastric mucosa  . BIOPSY N/A 12/03/2012   Procedure: BIOPSY;  Surgeon: Daneil Dolin, MD;  Location: [facility];  Service: Endoscopy;  Laterality: N/A;  . COLONOSCOPY WITH PROPOFOL N/A 09/03/2012   OYD:XAJOINO polyp-removed as outlined above. Prominent internal hemorrhoids. Tubular adenoma  . ESOPHAGOGASTRODUODENOSCOPY (EGD) WITH PROPOFOL N/A 09/03/2012   MVE:HMCNOB hernia. Gastric diverticulum. Gastric ulcers with associated erosions. Duodenal erosions. Status post gastric biopsy. H.PYLORI gastritis   . ESOPHAGOGASTRODUODENOSCOPY (EGD) WITH PROPOFOL N/A 12/03/2012   Dr. Gala Romney: gastric diverticulum, gastric erosions and scar. Previously noted gastric ulcer completed healed. Biopsy without H.pylori.   . ESOPHAGOGASTRODUODENOSCOPY (EGD) WITH PROPOFOL N/A 01/23/2016   Procedure: ESOPHAGOGASTRODUODENOSCOPY (EGD) WITH PROPOFOL;  Surgeon: Doran Stabler, MD;  [facility];  Service: Gastroenterology;  Laterality: N/A;  . I & D EXTREMITY Right 07/11/2016   Procedure: DELAY PRIMARY CLOSURE FOOT AMPUTATION;  Surgeon: Edrick Kins, DPM;  Location: [facility];  Service: Podiatry;  Laterality: Right;  . LIVER BIOPSY  2005   Done in [place], [place]. Chronic hepatitis with mild periportal inflammation, lobular unicellular necrosis and portal fibrosis. Grade 2, stage 1-2.  . LOWER EXTREMITY ANGIOGRAPHY Right 06/03/2016    Procedure: Lower Extremity Angiography;  Surgeon: Waynetta Sandy, MD;  Location: [facility];  Service: Cardiovascular;  Laterality: Right;  . MAXIMUM ACCESS (MAS)POSTERIOR LUMBAR INTERBODY FUSION (PLIF) 1 LEVEL N/A 11/25/2013   Procedure: FOR MAXIMUM ACCESS (MAS) POSTERIOR LUMBAR INTERBODY FUSION (PLIF) 1 LEVEL;  Surgeon: Eustace Moore, MD;  Location: [facility];  Service: Neurosurgery;  Laterality: N/A;  FOR MAXIMUM ACCESS (MAS) POSTERIOR LUMBAR INTERBODY FUSION (PLIF) 1 LEVEL LUMBAR 3-4  . PERIPHERAL VASCULAR ATHERECTOMY Right 06/03/2016   Procedure: Peripheral Vascular Atherectomy;  Surgeon: Waynetta Sandy, MD;  Location: [facility];  Service: Cardiovascular;  Laterality: Right;  SFA WITH STENT  . PERIPHERAL VASCULAR CATHETERIZATION Left 08/01/2015   Procedure: Lower Extremity Angiography;  Surgeon: Serafina Mitchell, MD;  Location: [facility];  Service: Cardiovascular;  Laterality: Left;  . PERIPHERAL VASCULAR CATHETERIZATION N/A 08/01/2015   Procedure: Abdominal Aortogram;  Surgeon: Serafina Mitchell, MD;  Location: [facility];  Service: Cardiovascular;  Laterality: N/A;  . PERIPHERAL VASCULAR CATHETERIZATION N/A 08/08/2015   Procedure: Abdominal Aortogram w/Lower Extremity;  Surgeon: Butch Penny  Trula Slade, MD;  Location: [facility];  Service: Cardiovascular;  Laterality: N/A;  . PERIPHERAL VASCULAR CATHETERIZATION Left 08/08/2015   Procedure: Peripheral Vascular Balloon Angioplasty;  Surgeon: Serafina Mitchell, MD;  Location: [facility];  Service: Cardiovascular;  Laterality: Left;  left popiteal artery, left peronealtrunk, left post tibial  . POLYPECTOMY N/A 09/03/2012   Procedure: POLYPECTOMY;  Surgeon: Daneil Dolin, MD;  [facility];  Service: Endoscopy;  Laterality: N/A;  cecal polyp  . TONSILLECTOMY    . TRANSMETATARSAL AMPUTATION Right 06/13/2016   Procedure: RIGHT TRANSMETATARSAL AMPUTATION;  Surgeon: Waynetta Sandy, MD;   Location: [facility];  Service: Vascular;  Laterality: Right;  . TRANSMETATARSAL AMPUTATION Right 07/10/2016   Procedure: REVISION RIGHT TRANSMETATARSAL AMPUTATION;  Surgeon: Waynetta Sandy, MD;  Location: [facility];  Service: Vascular;  Laterality: Right;    Allergies No known allergies  Family History  Problem Relation Age of Onset  . Breast cancer Mother   . Heart failure Mother   . Diabetes Father   . Hypertension Father   . Heart disease Father   . Hyperlipidemia Father   . Diabetes Sister   . Hypertension Sister   . Breast cancer Sister   . Colon cancer Neg Hx   . Liver disease Neg Hx     Social History Social History   Tobacco Use  . Smoking status: Former Smoker    Packs/day: 1.00    Years: 47.00    Pack years: 47.00    Types: Cigarettes    Quit date: 07/13/2015    Years since quitting: 4.6  . Smokeless tobacco: Never Used  Vaping Use  . Vaping Use: Never used  Substance Use Topics  . Alcohol use: No    Alcohol/week: 0.0 standard drinks    Comment:  none 08/15/2015 "stopped drinking 3-4 years ago"  . Drug use: No    Comment: 08/15/2015 "last used cocaine 3-4 months ago"    Review of Systems  All other systems negative except as documented in the HPI. All pertinent positives and negatives as reviewed in the HPI. ____________________________________________  PHYSICAL EXAM:  VITAL SIGNS: ED Triage Vitals  Enc Vitals Group     BP 03/20/20 0055 (!) 172/102     Pulse Rate 03/20/20 0055 (!) 118     Resp 03/20/20 0055 18     Temp 03/20/20 0055 99.6 F (37.6 C)     Temp Source 03/20/20 0055 Rectal     SpO2 03/20/20 0055 100 %     Weight 03/20/20 0044 275 lb 9.2 oz (125 kg)    Constitutional: Alert and oriented. Well appearing and in no acute distress. Eyes: Conjunctivae are normal. PERRL. EOMI. Head: Atraumatic. Nose: No congestion/rhinnorhea. Mouth/Throat: Mucous membranes are moist.  Oropharynx non-erythematous. Neck: No stridor.  No meningeal signs.    Cardiovascular: Upper tensive, tachycardic rate, regular rhythm. Good peripheral circulation. Grossly normal heart sounds.   Respiratory: Tachypnea respiratory effort.  No retractions. Lungs diminished more on the right. Gastrointestinal: Soft and nontender.  Mild distention but no obvious fluid wave.  Musculoskeletal: No lower extremity tenderness nor edema. No gross deformities of extremities aside from known bilateral BKA's. Neurologic:  Normal speech and language. No gross focal neurologic deficits are appreciated.  Skin:  Skin is warm, diaphoretic and intact. No rash noted.  ____________________________________________   LABS (all labs ordered are listed, but only abnormal results are displayed)  Labs Reviewed  LACTIC ACID, PLASMA - Abnormal; Notable for the  following components:      Result Value   Lactic Acid, Venous 2.1 (*)    All other components within normal limits  BRAIN NATRIURETIC PEPTIDE - Abnormal; Notable for the following components:   B Natriuretic Peptide 213.0 (*)    All other components within normal limits  CBG MONITORING, ED - Abnormal; Notable for the following components:   Glucose-Capillary 281 (*)    All other components within normal limits  TROPONIN I (HIGH SENSITIVITY) - Abnormal; Notable for the following components:   Troponin I (High Sensitivity) 2,819 (*)    All other components within normal limits  SARS CORONAVIRUS 2 BY RT PCR (HOSPITAL ORDER, [facility])  CULTURE, BLOOD (ROUTINE X 2)  CULTURE, BLOOD (ROUTINE X 2)  SARS CORONAVIRUS 2 (TAT 6-24 HRS)  PROTIME-INR  LACTIC ACID, PLASMA  URINALYSIS, ROUTINE W REFLEX MICROSCOPIC  COMPREHENSIVE METABOLIC PANEL  POC SARS CORONAVIRUS 2 AG -  ED  TROPONIN I (HIGH SENSITIVITY)   ____________________________________________  EKG   EKG Interpretation  Date/Time:  Monday March 20 2020 00:48:02 EST Ventricular Rate:  121 PR Interval:    QRS Duration: 91 QT  Interval:  329 QTC Calculation: 467 R Axis:   79 Text Interpretation: Sinus tachycardia Nonspecific repol abnormality, inferior leads faster rate, otherwise no significant changes from previous Confirmed by Merrily Pew [phone]) on 03/20/2020 12:49:33 AM       EKG Interpretation  Date/Time:  Monday March 20 2020 03:04:50 EST Ventricular Rate:  98 PR Interval:    QRS Duration: 89 QT Interval:  371 QTC Calculation: 474 R Axis:   76 Text Interpretation: Sinus rhythm Borderline T abnormalities, lateral leads Minimal ST elevation, anterior leads no obvious acute  changes since 00:48 Confirmed by Merrily Pew [phone]) on 03/20/2020 3:09:09 AM      ____________________________________________  RADIOLOGY  DG Chest Port 1 View  Result Date: 03/20/2020 CLINICAL DATA:  Shortness of breath EXAM: PORTABLE CHEST 1 VIEW COMPARISON:  October 21, 2019 FINDINGS: The heart size and mediastinal contours are within normal limits. Mild diffuse interstitial opacities are seen throughout both lungs. No pleural effusion. No acute osseous abnormality. IMPRESSION: Mildly increased interstitial markings throughout both lungs which could be due to interstitial edema or infectious etiology. Electronically Signed   By: Prudencio Pair M.D.   On: 03/20/2020 01:37   ____________________________________________  PROCEDURES  Procedure(s) performed:   .Critical Care Performed by: Merrily Pew, MD Authorized by: Merrily Pew, MD   Critical care provider statement:    Critical care time (minutes):  45   Critical care was necessary to treat or prevent imminent or life-threatening deterioration of the following conditions:  Cardiac failure   Critical care was time spent personally by me on the following activities:  Discussions with consultants, evaluation of patient's response to treatment, examination of patient, ordering and performing treatments and interventions, ordering and review of laboratory studies,  ordering and review of radiographic studies, pulse oximetry, re-evaluation of patient's condition, obtaining history from patient or surrogate and review of old charts   ____________________________________________  INITIAL IMPRESSION / ASSESSMENT AND PLAN    This patient presents to the ED for concern of shortness of breath, this involves an extensive number of treatment options, and is a complaint that carries with it a high risk of complications and morbidity.  The differential diagnosis includes ACS, acute pulmonary edema, pneumonia, pneumothorax, Covid.  We will go ahead and start with BMP, EKG, troponin, chest x-ray.  Put him  back on his 2 L nasal cannula and see what his actual oxygen requirement is.  With his blood pressure and heart rate suspect pulmonary edema is the most likely source.  We will see what chest x-ray looks like and make more decisions based on that.  Additional history obtained:   Additional history obtained from EMS per nursing.  Previous records obtained and reviewed in epic (as available) and documented above.  ED Course  Clinical Course as of 03/20/20 0639  [facility] Mar 20, 2020  0058 Pulse Rate(!): 118 [JM]  0058 BP(!): 172/102 [JM]  0058 Temp: 99.6 F (37.6 C) [JM]  0058 SpO2: 100 % [JM]  0058 EKG 12-Lead [JM]  0124 DG Chest Port 1 View Interstitial abnormalities, possibly tiny L pleural effusion. Pulmonary edema vs infectious? [JM]  0226 Apparently patient with hypoxia to the 88 on 2L and subsequently on 4L, doing well on 6, will work on weaning but may need admitted, pending labs.  [JM]  0227 B Natriuretic Peptide(!): 213.0 Fits clinical picture, will start diuresis since initial COVID negative and is afebrile making infection less likely.  [JM]  0305 Troponin I (High Sensitivity)(!!): 2,819 Heparin started. Could be the cause of all the symptoms. Cards consulted. Will hold lasix at this time. [JM]  0400 Discussed with cardiology at [facility]. Doesn't  agree with heparin (already started so will continue) but thinks it could be HTN related. Requests lasix and NTG. After talking to patient again he states he did have some chest pain the last few days just none today. Could be HTN related, could be a missed MI a few days ago or ongoing ischemia, repeat ECG is similar to old. Cardiology also recommends cardiology consultation in the morning and if they think patient can stay here, admit to hospitalist, if they still think need for intervention then can still await bed. Bed request placed.  [JM]    Clinical Course User Index [JM] Salbador Fiveash, Corene Cornea, MD     Medicines ordered:  Medications - No data to display  Consultations Obtained:   I consulted cardiology  and discussed lab and imaging findings   CRITICAL INTERVENTIONS:  . Heparin . Nitroglycerin . Cardiology consultation  Reevaluation:  After the interventions stated above, I reevaluated the patient and found improved diaphoresis. No chest pain. SOB improved.   FINAL IMPRESSION AND PLAN Final diagnoses:  SOB (shortness of breath)  NSTEMI (non-ST elevated myocardial infarction) [facility])    Medical screening exam was performed and I feel the patient has had appropriate emergency department evaluation and work-up for their chief complaint and is stable for ADMISSION to the hospitalist time.  I discussed with Dr.  with the Cardiology service and discussed labs, imaging and other work-up in the emergency room.  They agree to admission for further management and work-up of said condition.   ____________________________________________   NEW OUTPATIENT MEDICATIONS STARTED DURING THIS VISIT:  New Prescriptions   No medications on file    Note:  This note was prepared with assistance of Dragon voice recognition software. Occasional wrong-word or sound-a-like substitutions may have occurred due to the inherent limitations of voice recognition software.   Marylouise Mallet, Corene Cornea, MD 03/20/20  [phone]

## 2020-03-20 NOTE — ED Notes (Signed)
Tolerating blood well. 

## 2020-03-20 NOTE — Progress Notes (Signed)
ANTICOAGULATION CONSULT NOTE - Initial Consult  Pharmacy Consult for Heparin Indication: chest pain/ACS  Allergies  Allergen Reactions  . No Known Allergies     Patient Measurements: Weight: 125 kg (275 lb 9.2 oz)  Ht: 67 in IBW: 66 kg Heparin Dosing Weight: 94 kg  Vital Signs: Temp: 99.6 F (37.6 C) (02/07 0055) Temp Source: Rectal (02/07 0055) BP: 146/89 (02/07 0200) Pulse Rate: 106 (02/07 0200)  Labs: Recent Labs    03/20/20 0119 03/20/20 0133  LABPROT 13.1  --   INR 1.0  --   TROPONINIHS  --  2,819*    CrCl cannot be calculated (Patient's most recent lab result is older than the maximum 21 days allowed.).   Medical History: Past Medical History:  Diagnosis Date  . Anemia   . Asthma   . BPH (benign prostatic hyperplasia)   . Cellulitis and abscess of foot 07/2015  . Chronic diastolic heart failure ([place])    per Nursing facility  . Chronic kidney disease    CKD stage 3  . CVA (cerebral vascular accident) ([place]) 2015   denies residual on 08/15/2015  . GERD (gastroesophageal reflux disease)   . Hepatitis C    states he was diagnosed in 2007 or 2007 while living in [place], [place]  . Hepatitis C   . Hyperlipidemia   . Hypertension   . Ischemia of foot 05/31/2016  . Osteomyelitis ([place])    per Nursing Facility  . Peripheral neuropathy   . Pneumonia X 1  . PVD (peripheral vascular disease) ([place])   . Type II diabetes mellitus ([place])   . 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 M presents with SOB. Trop elevated to 2819. To begin heparin for ACS. No AC PTA.  Goal of Therapy:  Heparin level 0.3-0.7 units/ml Monitor platelets by anticoagulation protocol: Yes   Plan:  Heparin IV bolus 4000 units Heparin gtt at 1350 units/hr Will f/u heparin level in 6 hours Daily heparin level and CBC  Sherlon Handing, PharmD, BCPS Please see amion for complete clinical pharmacist phone list 03/20/2020,3:00 AM

## 2020-03-20 NOTE — ED Triage Notes (Signed)
Pt from  home with SOB that "just stated" prior to EMS arrival.

## 2020-03-20 NOTE — Progress Notes (Signed)
03/20/2020 3:03 PM  Notified by RN D. Hassell Done that blood bank refusing to provide more than 1 unit of PRBC.  We expressed to them that patient is having NSTEMI. Informed us that they have critically low supply at this time.  Hopefully he can get more blood when he arrives at [facility].   Murvin Natal MD

## 2020-03-20 NOTE — Progress Notes (Signed)
ANTICOAGULATION CONSULT NOTE -   Pharmacy Consult for Heparin Indication: chest pain/ACS  Allergies  Allergen Reactions  . No Known Allergies     Patient Measurements: Weight: 125 kg (275 lb 9.2 oz)  Ht: 67 in IBW: 66 kg Heparin Dosing Weight: 94 kg  Vital Signs: Temp: 97.9 F (36.6 C) (02/07 0904) Temp Source: Oral (02/07 0904) BP: 158/66 (02/07 0900) Pulse Rate: 101 (02/07 0900)  Labs: Recent Labs    03/20/20 0119 03/20/20 0133 03/20/20 0324 03/20/20 0911  HGB  --   --   --  7.6*  HCT  --   --   --  25.5*  PLT  --   --   --  256  LABPROT 13.1  --   --   --   INR 1.0  --   --   --   HEPARINUNFRC  --   --   --  0.44  CREATININE  --   --  1.39*  --   TROPONINIHS  --  2,819* 3,337*  --     Estimated Creatinine Clearance: 65.4 mL/min (A) (by C-G formula based on SCr of 1.39 mg/dL (H)).   Medical History: Past Medical History:  Diagnosis Date  . Anemia   . Asthma   . BPH (benign prostatic hyperplasia)   . Cellulitis and abscess of foot 07/2015  . Chronic diastolic heart failure ([place])    per Nursing facility  . Chronic kidney disease    CKD stage 3  . CVA (cerebral vascular accident) ([place]) 2015   denies residual on 08/15/2015  . GERD (gastroesophageal reflux disease)   . Hepatitis C    states he was diagnosed in 2007 or 2007 while living in [place], [place]  . Hepatitis C   . Hyperlipidemia   . Hypertension   . Ischemia of foot 05/31/2016  . Osteomyelitis ([place])    per Nursing Facility  . Peripheral neuropathy   . Pneumonia X 1  . PVD (peripheral vascular disease) ([place])   . Type II diabetes mellitus ([place])   . Wet gangrene ([place]) 06/10/2016    Medications:  Awaiting electronic med rec  Assessment: 70 y.o. M presents with SOB. Trop elevated to 2819. To begin heparin for ACS. No AC PTA. Patient now reporting dark, tarry stools over the last few week. HGb 7.6, cardiology plans to transfuse with 2 unit PRBCs and cath after more stable. Continue heparin for  now, unless any active signs of bleeding.  HL 0.44, therapeutic  Goal of Therapy:  Heparin level 0.3-0.7 units/ml Monitor platelets by anticoagulation protocol: Yes   Plan:  Continue heparin infusion at 1350 units/hr Will f/u heparin level in 6 hours Daily heparin level and CBC  Isac Sarna, BS Vena Austria, BCPS Clinical Pharmacist Pager [phone] 03/20/2020,10:30 AM

## 2020-03-20 NOTE — H&P (Addendum)
Medicine team admitting

## 2020-03-20 NOTE — ED Notes (Signed)
Pt informed of clinical treatment and understands plans for treatment and admission.

## 2020-03-21 ENCOUNTER — Encounter (HOSPITAL_COMMUNITY): Payer: Self-pay | Admitting: Family Medicine

## 2020-03-21 ENCOUNTER — Encounter (HOSPITAL_COMMUNITY)
Admission: EM | Disposition: A | Discharge status: Skilled Nursing Facility | Payer: Self-pay | Source: Home / Self Care | Attending: Internal Medicine

## 2020-03-21 DIAGNOSIS — N179 Acute kidney failure, unspecified: Secondary | ICD-10-CM

## 2020-03-21 DIAGNOSIS — I214 Non-ST elevation (NSTEMI) myocardial infarction: Secondary | ICD-10-CM | POA: Diagnosis not present

## 2020-03-21 DIAGNOSIS — D649 Anemia, unspecified: Secondary | ICD-10-CM

## 2020-03-21 DIAGNOSIS — J810 Acute pulmonary edema: Secondary | ICD-10-CM

## 2020-03-21 DIAGNOSIS — S88112A Complete traumatic amputation at level between knee and ankle, left lower leg, initial encounter: Secondary | ICD-10-CM | POA: Diagnosis not present

## 2020-03-21 DIAGNOSIS — I2510 Atherosclerotic heart disease of native coronary artery without angina pectoris: Secondary | ICD-10-CM | POA: Diagnosis not present

## 2020-03-21 HISTORY — PX: LEFT HEART CATH AND CORONARY ANGIOGRAPHY: CATH118249

## 2020-03-21 LAB — LIPID PANEL
Cholesterol: 148 mg/dL (ref 0–200)
HDL: 39 mg/dL — ABNORMAL LOW (ref >40)
LDL Cholesterol: 71 mg/dL (ref 0–99)
Total CHOL/HDL Ratio: 3.8 RATIO
Triglycerides: 188 mg/dL — ABNORMAL HIGH (ref <150)
VLDL: 38 mg/dL (ref 0–40)

## 2020-03-21 LAB — MAGNESIUM: Magnesium: 2 mg/dL (ref 1.7–2.4)

## 2020-03-21 LAB — HEPARIN LEVEL (UNFRACTIONATED)
Heparin Unfractionated: 0.39 IU/mL (ref 0.30–0.70)
Heparin Unfractionated: 0.28 IU/mL — ABNORMAL LOW (ref 0.30–0.70)

## 2020-03-21 LAB — FERRITIN: Ferritin: 19 ng/mL — ABNORMAL LOW (ref 24–336)

## 2020-03-21 LAB — CBC
HCT: 26.1 % — ABNORMAL LOW (ref 39.0–52.0)
Hemoglobin: 7.9 g/dL — ABNORMAL LOW (ref 13.0–17.0)
MCH: 29.6 pg (ref 26.0–34.0)
MCHC: 30.3 g/dL (ref 30.0–36.0)
MCV: 97.8 fL (ref 80.0–100.0)
Platelets: 226 10*3/uL (ref 150–400)
RBC: 2.67 MIL/uL — ABNORMAL LOW (ref 4.22–5.81)
RDW: 15.1 % (ref 11.5–15.5)
WBC: 6.9 10*3/uL (ref 4.0–10.5)
nRBC: 0 % (ref 0.0–0.2)

## 2020-03-21 LAB — CREATININE, SERUM
Creatinine, Ser: 1.34 mg/dL — ABNORMAL HIGH (ref 0.61–1.24)
GFR, Estimated: 58 mL/min — ABNORMAL LOW (ref >60)

## 2020-03-21 LAB — TYPE AND SCREEN
ABO/RH(D): O POS
Antibody Screen: NEGATIVE
Unit division: 0

## 2020-03-21 LAB — IRON AND TIBC
Iron: 60 ug/dL (ref 45–182)
Saturation Ratios: 19 % (ref 17.9–39.5)
TIBC: 320 ug/dL (ref 250–450)
UIBC: 260 ug/dL

## 2020-03-21 LAB — FOLATE: Folate: 10.5 ng/mL (ref >5.9)

## 2020-03-21 LAB — TROPONIN I (HIGH SENSITIVITY)
Troponin I (High Sensitivity): 9585 ng/L (ref <18)
Troponin I (High Sensitivity): 12625 ng/L (ref <18)

## 2020-03-21 LAB — BASIC METABOLIC PANEL
Anion gap: 7 (ref 5–15)
BUN: 14 mg/dL (ref 8–23)
CO2: 33 mmol/L — ABNORMAL HIGH (ref 22–32)
Calcium: 8.6 mg/dL — ABNORMAL LOW (ref 8.9–10.3)
Chloride: 95 mmol/L — ABNORMAL LOW (ref 98–111)
Creatinine, Ser: 1.33 mg/dL — ABNORMAL HIGH (ref 0.61–1.24)
GFR, Estimated: 59 mL/min — ABNORMAL LOW (ref >60)
Glucose, Bld: 140 mg/dL — ABNORMAL HIGH (ref 70–99)
Potassium: 3.6 mmol/L (ref 3.5–5.1)
Sodium: 135 mmol/L (ref 135–145)

## 2020-03-21 LAB — BPAM RBC
Blood Product Expiration Date: 202203152359
ISSUE DATE / TIME: 202202071257
Unit Type and Rh: 5100

## 2020-03-21 LAB — HEMOGLOBIN A1C
Hgb A1c MFr Bld: 6.6 % — ABNORMAL HIGH (ref 4.8–5.6)
Mean Plasma Glucose: 143 mg/dL

## 2020-03-21 LAB — GLUCOSE, CAPILLARY
Glucose-Capillary: 129 mg/dL — ABNORMAL HIGH (ref 70–99)
Glucose-Capillary: 162 mg/dL — ABNORMAL HIGH (ref 70–99)
Glucose-Capillary: 195 mg/dL — ABNORMAL HIGH (ref 70–99)

## 2020-03-21 LAB — HEMOGLOBIN AND HEMATOCRIT, BLOOD
HCT: 29.4 % — ABNORMAL LOW (ref 39.0–52.0)
Hemoglobin: 9.1 g/dL — ABNORMAL LOW (ref 13.0–17.0)

## 2020-03-21 LAB — MRSA PCR SCREENING: MRSA by PCR: POSITIVE — AB

## 2020-03-21 LAB — PREPARE RBC (CROSSMATCH)

## 2020-03-21 LAB — VITAMIN B12: Vitamin B-12: 386 pg/mL (ref 180–914)

## 2020-03-21 SURGERY — LEFT HEART CATH AND CORONARY ANGIOGRAPHY
Anesthesia: LOCAL

## 2020-03-21 MED ORDER — CHLORHEXIDINE GLUCONATE CLOTH 2 % EX PADS
6.0000 | MEDICATED_PAD | Freq: Every day | CUTANEOUS | Status: AC
  Administered 2020-03-21 – 2020-03-25 (×3): 6 via TOPICAL

## 2020-03-21 MED ORDER — VERAPAMIL HCL 2.5 MG/ML IV SOLN
INTRAVENOUS | Status: AC
  Filled 2020-03-21: qty 2

## 2020-03-21 MED ORDER — INSULIN ASPART 100 UNIT/ML ~~LOC~~ SOLN
0.0000 [IU] | Freq: Three times a day (TID) | SUBCUTANEOUS | Status: DC
  Administered 2020-03-21: 3 [IU] via SUBCUTANEOUS
  Administered 2020-03-21: 07:00:00 2 [IU] via SUBCUTANEOUS
  Administered 2020-03-22: 8 [IU] via SUBCUTANEOUS
  Administered 2020-03-22: 3 [IU] via SUBCUTANEOUS
  Administered 2020-03-22: 2 [IU] via SUBCUTANEOUS
  Administered 2020-03-23: 12:00:00 5 [IU] via SUBCUTANEOUS
  Administered 2020-03-23: 17:00:00 3 [IU] via SUBCUTANEOUS
  Administered 2020-03-23 – 2020-03-24 (×2): 5 [IU] via SUBCUTANEOUS
  Administered 2020-03-24: 2 [IU] via SUBCUTANEOUS
  Administered 2020-03-24: 3 [IU] via SUBCUTANEOUS
  Administered 2020-03-25: 5 [IU] via SUBCUTANEOUS
  Administered 2020-03-26: 19:00:00 2 [IU] via SUBCUTANEOUS
  Administered 2020-03-26 – 2020-03-27 (×4): 3 [IU] via SUBCUTANEOUS
  Administered 2020-03-28: 17:00:00 2 [IU] via SUBCUTANEOUS
  Administered 2020-03-28 (×2): 5 [IU] via SUBCUTANEOUS
  Administered 2020-03-29 (×3): 3 [IU] via SUBCUTANEOUS
  Administered 2020-03-30: 5 [IU] via SUBCUTANEOUS
  Administered 2020-03-30: 18:00:00 11 [IU] via SUBCUTANEOUS
  Administered 2020-03-31: 2 [IU] via SUBCUTANEOUS
  Administered 2020-03-31: 17:00:00 8 [IU] via SUBCUTANEOUS
  Administered 2020-04-01: 15 [IU] via SUBCUTANEOUS
  Administered 2020-04-01: 17:00:00 3 [IU] via SUBCUTANEOUS
  Administered 2020-04-01: 5 [IU] via SUBCUTANEOUS
  Administered 2020-04-02: 17:00:00 3 [IU] via SUBCUTANEOUS
  Administered 2020-04-02: 06:00:00 5 [IU] via SUBCUTANEOUS
  Administered 2020-04-02: 13:00:00 3 [IU] via SUBCUTANEOUS
  Administered 2020-04-03: 2 [IU] via SUBCUTANEOUS
  Administered 2020-04-04: 3 [IU] via SUBCUTANEOUS
  Administered 2020-04-04: 13:00:00 2 [IU] via SUBCUTANEOUS
  Administered 2020-04-05 – 2020-04-06 (×2): 3 [IU] via SUBCUTANEOUS
  Administered 2020-04-06: 2 [IU] via SUBCUTANEOUS
  Administered 2020-04-06: 7 [IU] via SUBCUTANEOUS
  Administered 2020-04-07 (×3): 3 [IU] via SUBCUTANEOUS
  Administered 2020-04-08: 8 [IU] via SUBCUTANEOUS
  Administered 2020-04-08: 3 [IU] via SUBCUTANEOUS

## 2020-03-21 MED ORDER — NITROGLYCERIN 1 MG/10 ML FOR IR/CATH LAB
INTRA_ARTERIAL | Status: AC
  Filled 2020-03-21: qty 10

## 2020-03-21 MED ORDER — SODIUM CHLORIDE 0.9 % IV SOLN
INTRAVENOUS | Status: DC
Start: 2020-03-22 — End: 2020-03-21

## 2020-03-21 MED ORDER — FENTANYL CITRATE (PF) 100 MCG/2ML IJ SOLN
INTRAMUSCULAR | Status: AC
  Filled 2020-03-21: qty 2

## 2020-03-21 MED ORDER — HEPARIN (PORCINE) IN NACL 1000-0.9 UT/500ML-% IV SOLN
INTRAVENOUS | Status: AC
  Filled 2020-03-21: qty 1000

## 2020-03-21 MED ORDER — SODIUM CHLORIDE 0.9% FLUSH
3.0000 mL | Freq: Two times a day (BID) | INTRAVENOUS | Status: DC
  Administered 2020-03-21 – 2020-04-08 (×27): 3 mL via INTRAVENOUS

## 2020-03-21 MED ORDER — IOHEXOL 350 MG/ML SOLN
INTRAVENOUS | Status: AC
  Filled 2020-03-21: qty 1

## 2020-03-21 MED ORDER — INSULIN ASPART 100 UNIT/ML ~~LOC~~ SOLN: 0.0000 [IU] | Freq: Three times a day (TID) | SUBCUTANEOUS | Status: DC

## 2020-03-21 MED ORDER — HEPARIN SODIUM (PORCINE) 5000 UNIT/ML IJ SOLN
5000.0000 [IU] | Freq: Three times a day (TID) | INTRAMUSCULAR | Status: DC
  Administered 2020-03-22 – 2020-03-29 (×22): 5000 [IU] via SUBCUTANEOUS
  Filled 2020-03-21 (×22): qty 1

## 2020-03-21 MED ORDER — SODIUM CHLORIDE 0.9 % IV SOLN
INTRAVENOUS | Status: AC | PRN
  Administered 2020-03-21: 50 mL/h via INTRAVENOUS

## 2020-03-21 MED ORDER — HEPARIN SODIUM (PORCINE) 1000 UNIT/ML IJ SOLN
INTRAMUSCULAR | Status: DC | PRN
  Administered 2020-03-21: 5000 [IU] via INTRAVENOUS

## 2020-03-21 MED ORDER — LIDOCAINE HCL (PF) 1 % IJ SOLN
INTRAMUSCULAR | Status: AC
  Filled 2020-03-21: qty 30

## 2020-03-21 MED ORDER — HEPARIN SODIUM (PORCINE) 1000 UNIT/ML IJ SOLN
INTRAMUSCULAR | Status: AC
  Filled 2020-03-21: qty 1

## 2020-03-21 MED ORDER — ASPIRIN 81 MG PO CHEW: 81.0000 mg | CHEWABLE_TABLET | ORAL | Status: DC

## 2020-03-21 MED ORDER — SODIUM CHLORIDE 0.9% FLUSH
3.0000 mL | Freq: Two times a day (BID) | INTRAVENOUS | Status: DC
Start: 2020-03-21 — End: 2020-04-08
  Administered 2020-03-21 – 2020-04-06 (×20): 3 mL via INTRAVENOUS

## 2020-03-21 MED ORDER — MIDAZOLAM HCL 2 MG/2ML IJ SOLN
INTRAMUSCULAR | Status: AC
  Filled 2020-03-21: qty 2

## 2020-03-21 MED ORDER — HEPARIN (PORCINE) IN NACL 1000-0.9 UT/500ML-% IV SOLN
INTRAVENOUS | Status: DC | PRN
  Administered 2020-03-21 (×2): 500 mL

## 2020-03-21 MED ORDER — MUPIROCIN 2 % EX OINT
1.0000 "application " | TOPICAL_OINTMENT | Freq: Two times a day (BID) | CUTANEOUS | Status: AC
  Administered 2020-03-21 – 2020-03-25 (×10): 1 via NASAL
  Filled 2020-03-21 (×3): qty 22

## 2020-03-21 MED ORDER — SODIUM CHLORIDE 0.9 % IV SOLN: 250.0000 mL | INTRAVENOUS | Status: DC | PRN

## 2020-03-21 MED ORDER — IOHEXOL 350 MG/ML SOLN
INTRAVENOUS | Status: DC | PRN
  Administered 2020-03-21: 120 mL via INTRA_ARTERIAL

## 2020-03-21 MED ORDER — MIDAZOLAM HCL 2 MG/2ML IJ SOLN
INTRAMUSCULAR | Status: DC | PRN
  Administered 2020-03-21: 1 mg via INTRAVENOUS

## 2020-03-21 MED ORDER — SODIUM CHLORIDE 0.9% IV SOLUTION: Freq: Once | INTRAVENOUS | Status: AC

## 2020-03-21 MED ORDER — HYDRALAZINE HCL 20 MG/ML IJ SOLN: 10.0000 mg | INTRAMUSCULAR | Status: AC | PRN

## 2020-03-21 MED ORDER — LIDOCAINE HCL (PF) 1 % IJ SOLN
INTRAMUSCULAR | Status: DC | PRN
  Administered 2020-03-21: 5 mL

## 2020-03-21 MED ORDER — SODIUM CHLORIDE 0.9% FLUSH
3.0000 mL | INTRAVENOUS | Status: DC | PRN
  Administered 2020-03-26: 3 mL via INTRAVENOUS

## 2020-03-21 MED ORDER — SODIUM CHLORIDE 0.9% FLUSH: 3.0000 mL | INTRAVENOUS | Status: DC | PRN

## 2020-03-21 MED ORDER — VERAPAMIL HCL 2.5 MG/ML IV SOLN
INTRAVENOUS | Status: DC | PRN
  Administered 2020-03-21: 10 mL via INTRA_ARTERIAL

## 2020-03-21 MED ORDER — FENTANYL CITRATE (PF) 100 MCG/2ML IJ SOLN
INTRAMUSCULAR | Status: DC | PRN
  Administered 2020-03-21: 25 ug via INTRAVENOUS

## 2020-03-21 SURGICAL SUPPLY — 16 items
BAG SNAP BAND KOVER 36X36 (MISCELLANEOUS) ×1 IMPLANT
CATH 5FR JL3.5 JR4 ANG PIG MP (CATHETERS) ×1 IMPLANT
CATH INFINITI 5FR AL1 (CATHETERS) ×1 IMPLANT
CATH LAUNCHER 5F RADR (CATHETERS) IMPLANT
CATHETER LAUNCHER 5F RADR (CATHETERS) ×2
COVER DOME SNAP 22 D (MISCELLANEOUS) ×1 IMPLANT
DEVICE RAD COMP TR BAND LRG (VASCULAR PRODUCTS) ×1 IMPLANT
GLIDESHEATH SLEND SS 6F .021 (SHEATH) ×1 IMPLANT
GUIDEWIRE INQWIRE 1.5J.035X260 (WIRE) IMPLANT
INQWIRE 1.5J .035X260CM (WIRE) ×2
KIT HEART LEFT (KITS) ×2 IMPLANT
MAT PREVALON FULL STRYKER (MISCELLANEOUS) ×1 IMPLANT
PACK CARDIAC CATHETERIZATION (CUSTOM PROCEDURE TRAY) ×2 IMPLANT
SHEATH PROBE COVER 6X72 (BAG) ×1 IMPLANT
TRANSDUCER W/STOPCOCK (MISCELLANEOUS) ×2 IMPLANT
TUBING CIL FLEX 10 FLL-RA (TUBING) ×2 IMPLANT

## 2020-03-21 NOTE — Progress Notes (Signed)
[facility] for Heparin Indication: chest pain/ACS  Allergies  Allergen Reactions  . No Known Allergies     Patient Measurements: Weight: 122.4 kg (269 lb 14.4 oz)  Ht: 67 in IBW: 66 kg Heparin Dosing Weight: 94 kg  Vital Signs: Temp: 98.8 F (37.1 C) (02/08 1037) Temp Source: Oral (02/08 1037) BP: 148/89 (02/08 1037) Pulse Rate: 113 (02/08 1037)  Labs: Recent Labs    03/20/20 0119 03/20/20 0133 03/20/20 0324 03/20/20 0911 03/20/20 0911 03/20/20 1725 03/21/20 0049 03/21/20 0206 03/21/20 0414 03/21/20 0901  HGB  --   --   --  7.6*   < > 7.9*  --   --  7.9*  --   HCT  --   --   --  25.5*  --  26.3*  --   --  26.1*  --   PLT  --   --   --  256  --   --   --   --  226  --   LABPROT 13.1  --   --   --   --   --   --   --   --   --   INR 1.0  --   --   --   --   --   --   --   --   --   HEPARINUNFRC  --   --   --  0.44   < > 0.26* 0.39  --   --  0.28*  CREATININE  --   --  1.39*  --   --   --   --   --  1.33*  --   TROPONINIHS  --    < > 3,337*  --   --   --   --  9,585* 12,625*  --    < > = values in this interval not displayed.    Estimated Creatinine Clearance: 67.5 mL/min (A) (by C-G formula based on SCr of 1.33 mg/dL (H)).  Assessment: 70 y.o. M presents with SOB. Trop elevated to 2819. To begin heparin for ACS. No AC PTA. Patient reporting dark, tarry stools over the last few week. Hgb low stable at 7.9, up from 7.6 post transfusion of 2 units PRBCs 2/7 Heparin level has dropped to 0.28 subtherapeutic on heparin gtt at 1450 units/hr. No active bleeding reported. Plan for cath on 03/22/20.     Goal of Therapy:  Heparin level 0.3-0.7 units/ml Monitor platelets by anticoagulation protocol: Yes   Plan:  Increase heparin infusion to 1600 units/hr Check 6 hour Heparin level Daily heparin level and CBC   Nicole Cella, RPh Clinical Pharmacist Please see amion for complete clinical pharmacist phone list 03/21/2020,11:09 AM

## 2020-03-21 NOTE — Progress Notes (Signed)
[facility] patient sister is at the bedside please update post-cath [phone]

## 2020-03-21 NOTE — Progress Notes (Addendum)
ED provider called after reviewing an abnormal EKG. I have reviewed and agree that there is ST elevation. Troponin from 22 hours ago is trending up. I have placed an order to repeat troponin. I have also called to speak with the on call cardiology fellow, who has called back to report that he has seen the updated troponin and EKG, cardiology team expecting patient in the AM. He reports that if our cardiology team was aware of the trop of >3000, and he is resting comfortably, then he does not need to be transferred to [place] [facility]. I have also called to speak with bed placement to see if this patient's transfer can be moved up. They are trying to get him to a cardiac bed tonight.   Patient currently resting on NTG and Heparin drip. Will continue to monitor.

## 2020-03-21 NOTE — NC FL2 (Signed)
[place] LEVEL OF CARE SCREENING TOOL     IDENTIFICATION  Patient Name: Cory Weber Birthdate: [birth year] Sex: male Admission Date (Current Location): 03/20/2020  [place] and [place] Number:  [facility] [id number] [place] and Address:  [facility] [address] [place]      Provider Number: [id number]  Attending Physician Name and Address:  Cristal Ford, DO  Relative Name and Phone Number:  Geannie Risen (sister) Ph: [phone]    Current Level of Care: Hospital Recommended Level of Care: [place] Prior Approval Number:    Date Approved/Denied:   PASRR Number: [id number]  Discharge Plan: SNF    Current Diagnoses: Patient Active Problem List   Diagnosis Date Noted  . Heart failure ([place]) 03/20/2020  . NSTEMI (non-ST elevated myocardial infarction) ([place]) 03/20/2020  . Pneumonia due to COVID-19 virus 02/24/2019  . Acute respiratory failure with hypoxia ([place]) 02/20/2019  . COVID-19 virus detected 02/20/2019  . UTI (urinary tract infection) 02/20/2019  . DNR (do not resuscitate) 02/20/2019  . PAD (peripheral artery disease) ([place]) 08/22/2016  . Osteomyelitis of foot ([place]) 07/08/2016  . S/P transmetatarsal amputation of foot, right ([place]) 06/26/2016  . Constipation due to opioid therapy 06/15/2016  . Physical deconditioning 06/10/2016  . Wet gangrene ([place]) 06/10/2016  . Anemia, chronic disease 06/10/2016  . Chronic diastolic CHF (congestive heart failure) ([place]) 06/10/2016  . Ischemic pain of right foot 06/04/2016  . Ischemia of foot 05/31/2016  . 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 Dyslipidemia associated with type 2 diabetes mellitus ([place]) 01/21/2016  . Uncontrolled type II diabetes with peripheral autonomic neuropathy ([place]) 01/17/2016  . Weight gain 12/20/2015  . Edema of right lower extremity 12/20/2015  . Cough 11/26/2015  . GERD  (gastroesophageal reflux disease) 10/08/2015  . Amputation of left lower extremity below knee ([place]) 08/21/2015  . Phantom limb pain ([place])   . Anemia of chronic disease   . BPH (benign prostatic hyperplasia)   . ETOH abuse   . PVD (peripheral vascular disease) ([place])   . Chronic hepatitis C without hepatic coma ([place])   . History of CVA (cerebrovascular accident) without residual deficits   . Hyponatremia   . Chronic kidney disease, stage 3 ([place]) 07/18/2015  . Essential hypertension 07/18/2015  . S/P lumbar spinal fusion 11/25/2013  . Melena 12/03/2011  . Hemiparesthesia 02/03/2011  . Polysubstance abuse ([place]) 02/03/2011  . Tobacco abuse 02/03/2011    Orientation RESPIRATION BLADDER Height & Weight     Self,Time,Situation,Place  O2 (3L [place])   Weight: 269 lb 14.4 oz (122.4 kg) Height:     BEHAVIORAL SYMPTOMS/MOOD NEUROLOGICAL BOWEL Continent NUTRITION STATUS        Diet (see d/c summary)  AMBULATORY STATUS COMMUNICATION OF NEEDS Skin   Extensive Assist Verbally Normal                       Personal Care Assistance Level of Assistance  Bathing,Feeding,Dressing Bathing Assistance: Limited assistance Feeding assistance: Independent Dressing Assistance: Independent     Functional Limitations Info  Sight,Hearing,Speech Sight Info: Adequate Hearing Info: Adequate Speech Info: Adequate    SPECIAL CARE FACTORS FREQUENCY  OT (By licensed OT),PT (By licensed PT)     PT Frequency: 5x/week OT Frequency: 5x/week            Contractures Contractures Info: Not present    Additional Factors Info  Code Status,Allergies,Psychotropic Code Status  Info: DNR Allergies Info: NKA Psychotropic Info: Cymbalta; Neurontin (gabapentin)         Current Medications (03/21/2020):  This is the current hospital active medication list Current Facility-Administered Medications  Medication Dose Route Frequency Provider Last Rate Last Admin  . [START ON 03/22/2020] 0.9 %  sodium chloride  infusion   Intravenous Continuous Isaiah Serge, NP      . acetaminophen (TYLENOL) tablet 650 mg  650 mg Oral Q4H PRN Wynetta Emery, Clanford L, MD   650 mg at 03/21/20 0202  . amLODipine (NORVASC) tablet 5 mg  5 mg Oral Daily Johnson, Clanford L, MD   5 mg at 03/21/20 0918  . ascorbic acid (VITAMIN C) tablet 500 mg  500 mg Oral BID Wynetta Emery, Clanford L, MD   500 mg at 03/21/20 0917  . aspirin EC tablet 81 mg  81 mg Oral Daily Johnson, Clanford L, MD   81 mg at 03/21/20 0916  . baclofen (LIORESAL) tablet 10 mg  10 mg Oral TID PRN Johnson, Clanford L, MD      . carvedilol (COREG) tablet 6.25 mg  6.25 mg Oral BID WC Johnson, Clanford L, MD   6.25 mg at 03/21/20 0917  . Chlorhexidine Gluconate Cloth 2 % PADS 6 each  6 each Topical Q0600 Cristal Ford, DO   6 each at 03/21/20 1153  . clopidogrel (PLAVIX) tablet 75 mg  75 mg Oral Q breakfast Wynetta Emery, Clanford L, MD   75 mg at 03/21/20 0918  . DULoxetine (CYMBALTA) DR capsule 60 mg  60 mg Oral Daily Johnson, Clanford L, MD   60 mg at 03/21/20 0920  . ferrous sulfate tablet 325 mg  325 mg Oral Q breakfast Johnson, Clanford L, MD   325 mg at 03/21/20 0918  . heparin ADULT infusion 100 units/mL (25000 units/229mL)  1,600 Units/hr Intravenous Continuous Wendee Beavers, RPH 16 mL/hr at 03/21/20 1151 1,600 Units/hr at 03/21/20 1151  . insulin aspart (novoLOG) injection 0-15 Units  0-15 Units Subcutaneous TID WC Cristal Ford, DO   3 Units at 03/21/20 1207  . insulin aspart (novoLOG) injection 0-5 Units  0-5 Units Subcutaneous QHS Johnson, Clanford L, MD      . insulin aspart (novoLOG) injection 8 Units  8 Units Subcutaneous TID AC Johnson, Clanford L, MD   8 Units at 03/20/20 1806  . insulin glargine (LANTUS) injection 30 Units  30 Units Subcutaneous QHS Johnson, Clanford L, MD      . ipratropium-albuterol (DUONEB) 0.5-2.5 (3) MG/3ML nebulizer solution 3 mL  3 mL Nebulization Q6H PRN Johnson, Clanford L, MD      . lidocaine (LIDODERM) 5 % 1 patch  1 patch  Transdermal Daily Wynetta Emery, Clanford L, MD   1 patch at 03/21/20 0916  . mupirocin ointment (BACTROBAN) 2 % 1 application  1 application Nasal BID Cristal Ford, DO   1 application at 74/25/95 1207  . nitroGLYCERIN 50 mg in dextrose 5 % 250 mL (0.2 mg/mL) infusion  5-200 mcg/min Intravenous Continuous Johnson, Clanford L, MD 3 mL/hr at 03/21/20 0721 10 mcg/min at 03/21/20 0721  . ondansetron (ZOFRAN) injection 4 mg  4 mg Intravenous Q6H PRN Johnson, Clanford L, MD      . pantoprazole (PROTONIX) EC tablet 40 mg  40 mg Oral Daily Johnson, Clanford L, MD   40 mg at 03/21/20 0917  . polyethylene glycol (MIRALAX / GLYCOLAX) packet 17 g  17 g Oral Daily Johnson, Clanford L, MD   17 g at 03/20/20  1158  . pregabalin (LYRICA) capsule 75 mg  75 mg Oral BID Wynetta Emery, Clanford L, MD   75 mg at 03/21/20 0916  . rosuvastatin (CRESTOR) tablet 5 mg  5 mg Oral Daily Johnson, Clanford L, MD   5 mg at 03/21/20 0918  . sodium chloride flush (NS) 0.9 % injection 3 mL  3 mL Intravenous Q12H Isaiah Serge, NP   3 mL at 03/21/20 1153  . tamsulosin (FLOMAX) capsule 0.4 mg  0.4 mg Oral Daily Johnson, Clanford L, MD   0.4 mg at 03/21/20 0917  . torsemide (DEMADEX) tablet 40 mg  40 mg Oral Daily Johnson, Clanford L, MD   40 mg at 03/21/20 0917  . vitamin B-12 (CYANOCOBALAMIN) tablet 500 mcg  500 mcg Oral Daily Wynetta Emery, Clanford L, MD   500 mcg at 03/21/20 9249     Discharge Medications: Please see discharge summary for a list of discharge medications.  Relevant Imaging Results:  Relevant Lab Results:   Additional Information SSN: [id number]  Bethann Berkshire, LCSW

## 2020-03-21 NOTE — Progress Notes (Addendum)
Progress Note  Patient Name: Cory Weber Date of Encounter: 03/21/2020  [facility] Cardiologist: No primary care provider on file. Dr. Harl Bowie   Subjective   No chest pain, no SOB, BP a little low  Inpatient Medications    Scheduled Meds: . amLODipine  5 mg Oral Daily  . vitamin C  500 mg Oral BID  . aspirin EC  81 mg Oral Daily  . carvedilol  6.25 mg Oral BID WC  . clopidogrel  75 mg Oral Q breakfast  . DULoxetine  60 mg Oral Daily  . ferrous sulfate  325 mg Oral Q breakfast  . insulin aspart  0-15 Units Subcutaneous TID WC  . insulin aspart  0-5 Units Subcutaneous QHS  . insulin aspart  8 Units Subcutaneous TID AC  . insulin glargine  30 Units Subcutaneous QHS  . lidocaine  1 patch Transdermal Daily  . pantoprazole  40 mg Oral Daily  . polyethylene glycol  17 g Oral Daily  . pregabalin  75 mg Oral BID  . rosuvastatin  5 mg Oral Daily  . tamsulosin  0.4 mg Oral Daily  . torsemide  40 mg Oral Daily  . cyanocobalamin  500 mcg Oral Daily   Continuous Infusions: . heparin 1,450 Units/hr (03/21/20 0433)  . nitroGLYCERIN 50 mcg/min (03/21/20 0422)   PRN Meds: acetaminophen, baclofen, ipratropium-albuterol, ondansetron (ZOFRAN) IV   Vital Signs    Vitals:   03/21/20 0415 03/21/20 0445 03/21/20 0606 03/21/20 0615  BP: 137/77 (!) 130/91  (!) 150/77  Pulse: (!) 109 (!) 111    Resp: (!) 23 (!) 24  20  Temp:    99.2 F (37.3 C)  TempSrc:    Oral  SpO2: 100% 98%  96%  Weight:   122.4 kg     Intake/Output Summary (Last 24 hours) at 03/21/2020 0647 Last data filed at 03/21/2020 0433 Gross per 24 hour  Intake 939.41 ml  Output 1225 ml  Net -285.59 ml   Last 3 Weights 03/21/2020 03/20/2020 10/23/2019  Weight (lbs) 269 lb 14.4 oz 275 lb 9.2 oz 272 lb 0.8 oz  Weight (kg) 122.426 kg 125 kg 123.4 kg      Telemetry    ST  120s - Personally Reviewed  ECG    SR with improved ST elevation in ant. leads - Personally Reviewed  Physical Exam   GEN: No acute  distress.   Neck: mild lJVD Cardiac: RRR, no murmurs, rubs, or gallops.  Respiratory: Clear to diminished to auscultation bilaterally. GI: Soft, nontender, non-distended  MS: No edema; bilateral amputations Neuro:  Nonfocal  Psych: Normal affect   Labs    High Sensitivity Troponin:   Recent Labs  Lab 03/20/20 0133 03/20/20 0324 03/21/20 0206 03/21/20 0414  TROPONINIHS 2,819* 3,337* 9,585* 12,625*      Chemistry Recent Labs  Lab 03/20/20 0324 03/21/20 0414  NA 134* 135  K 4.2 3.6  CL 98 95*  CO2 33* 33*  GLUCOSE 308* 140*  BUN 14 14  CREATININE 1.39* 1.33*  CALCIUM 8.5* 8.6*  PROT 8.9*  --   ALBUMIN 3.2*  --   AST 26  --   ALT 10  --   ALKPHOS 51  --   BILITOT 0.3  --   GFRNONAA 56* 59*  ANIONGAP 3* 7     Hematology Recent Labs  Lab 03/20/20 0911 03/20/20 1725 03/21/20 0414  WBC 6.0  --  6.9  RBC 2.54*  --  2.67*  HGB 7.6* 7.9* 7.9*  HCT 25.5* 26.3* 26.1*  MCV 100.4*  --  97.8  MCH 29.9  --  29.6  MCHC 29.8*  --  30.3  RDW 14.5  --  15.1  PLT 256  --  226    BNP Recent Labs  Lab 03/20/20 0119  BNP 213.0*     DDimer No results for input(s): DDIMER in the last 168 hours.   Radiology    DG Chest Port 1 View  Result Date: 03/20/2020 CLINICAL DATA:  Shortness of breath EXAM: PORTABLE CHEST 1 VIEW COMPARISON:  October 21, 2019 FINDINGS: The heart size and mediastinal contours are within normal limits. Mild diffuse interstitial opacities are seen throughout both lungs. No pleural effusion. No acute osseous abnormality. IMPRESSION: Mildly increased interstitial markings throughout both lungs which could be due to interstitial edema or infectious etiology. Electronically Signed   By: Prudencio Pair M.D.   On: 03/20/2020 01:37   ECHOCARDIOGRAM COMPLETE  Result Date: 03/20/2020    ECHOCARDIOGRAM REPORT   Patient Name:   Cory Weber Date of Exam: 03/20/2020 Medical Rec #:  [id number]        Height:       67.0 in Accession #:    [id number]       Weight:        275.6 lb Date of Birth:  1950-04-17        BSA:          2.317 m Patient Age:    70 years         BP:           158/66 mmHg Patient Gender: M                HR:           99 bpm. Exam Location:  [facility] Procedure: 2D Echo, Cardiac Doppler and Color Doppler Indications:    Chest Pain R07.9  History:        Patient has prior history of Echocardiogram examinations, most                 recent 04/01/2018. Risk Factors:Hypertension, Diabetes and                 Dyslipidemia. ETOH abuse, PAD (peripheral artery disease),                 Bilateral BKA, Tobacco abuse.  Sonographer:    Alvino Chapel RCS Referring Phys: [id number] [place]  1. Apical hypokinesis. . Left ventricular ejection fraction, by estimation, is 40 to 45%. The left ventricle has mildly decreased function. The left ventricle demonstrates regional wall motion abnormalities (see scoring diagram/findings for description). There is mild left ventricular hypertrophy. Left ventricular diastolic parameters are indeterminate.  2. Right ventricular systolic function is normal. The right ventricular size is normal.  3. Left atrial size was mildly dilated.  4. The mitral valve is normal in structure. No evidence of mitral valve regurgitation. No evidence of mitral stenosis.  5. The aortic valve has an indeterminant number of cusps. There is mild calcification of the aortic valve. There is mild thickening of the aortic valve. Aortic valve regurgitation is not visualized. No aortic stenosis is present.  6. The inferior vena cava is normal in size with greater than 50% respiratory variability, suggesting right atrial pressure of 3 mmHg. FINDINGS  Left Ventricle: Apical hypokinesis. Left ventricular ejection fraction, by estimation, is 40 to 45%. The left ventricle  has mildly decreased function. The left ventricle demonstrates regional wall motion abnormalities. The left ventricular internal cavity size was normal in size. There is mild left  ventricular hypertrophy. Left ventricular diastolic parameters are indeterminate. Right Ventricle: The right ventricular size is normal. No increase in right ventricular wall thickness. Right ventricular systolic function is normal. Left Atrium: Left atrial size was mildly dilated. Right Atrium: Right atrial size was normal in size. Pericardium: There is no evidence of pericardial effusion. Mitral Valve: The mitral valve is normal in structure. No evidence of mitral valve regurgitation. No evidence of mitral valve stenosis. Tricuspid Valve: The tricuspid valve is normal in structure. Tricuspid valve regurgitation is not demonstrated. No evidence of tricuspid stenosis. Aortic Valve: The aortic valve has an indeterminant number of cusps. There is mild calcification of the aortic valve. There is mild thickening of the aortic valve. There is mild aortic valve annular calcification. Aortic valve regurgitation is not visualized. No aortic stenosis is present. Aortic valve mean gradient measures 4.9 mmHg. Aortic valve peak gradient measures 8.7 mmHg. Aortic valve area, by VTI measures 2.09 cm. Pulmonic Valve: The pulmonic valve was not well visualized. Pulmonic valve regurgitation is not visualized. No evidence of pulmonic stenosis. Aorta: The aortic root is normal in size and structure. Pulmonary Artery: Indeterminant PASP, inadequate TR jet. Venous: The inferior vena cava is normal in size with greater than 50% respiratory variability, suggesting right atrial pressure of 3 mmHg. IAS/Shunts: No atrial level shunt detected by color flow Doppler.  LEFT VENTRICLE PLAX 2D LVIDd:         5.30 cm  Diastology LVIDs:         4.50 cm  LV e' medial:    7.51 cm/s LV PW:         1.00 cm  LV E/e' medial:  14.2 LV IVS:        1.20 cm  LV e' lateral:   7.94 cm/s LVOT diam:     2.10 cm  LV E/e' lateral: 13.5 LV SV:         54 LV SV Index:   23 LVOT Area:     3.46 cm  RIGHT VENTRICLE RV S prime:     16.00 cm/s TAPSE (M-mode): 2.5 cm  LEFT ATRIUM           Index       RIGHT ATRIUM           Index LA diam:      3.60 cm 1.55 cm/m  RA Area:     17.60 cm LA Vol (A2C): 72.2 ml 31.16 ml/m RA Volume:   52.60 ml  22.70 ml/m LA Vol (A4C): 72.8 ml 31.41 ml/m  AORTIC VALVE AV Area (Vmax):    2.03 cm AV Area (Vmean):   1.81 cm AV Area (VTI):     2.09 cm AV Vmax:           147.67 cm/s AV Vmean:          106.547 cm/s AV VTI:            0.259 m AV Peak Grad:      8.7 mmHg AV Mean Grad:      4.9 mmHg LVOT Vmax:         86.50 cm/s LVOT Vmean:        55.600 cm/s LVOT VTI:          0.156 m LVOT/AV VTI ratio: 0.60  AORTA Ao Root diam: 3.20 cm MITRAL  VALVE MV Area (PHT): 2.75 cm     SHUNTS MV Decel Time: 276 msec     Systemic VTI:  0.16 m MV E velocity: 107.00 cm/s  Systemic Diam: 2.10 cm Carlyle Dolly MD Electronically signed by Carlyle Dolly MD Signature Date/Time: 03/20/2020/12:40:27 PM    Final     Cardiac Studies   Echo 03/20/20 IMPRESSIONS    1. Apical hypokinesis. . Left ventricular ejection fraction, by  estimation, is 40 to 45%. The left ventricle has mildly decreased  function. The left ventricle demonstrates regional wall motion  abnormalities (see scoring diagram/findings for  description). There is mild left ventricular hypertrophy. Left ventricular  diastolic parameters are indeterminate.  2. Right ventricular systolic function is normal. The right ventricular  size is normal.  3. Left atrial size was mildly dilated.  4. The mitral valve is normal in structure. No evidence of mitral valve  regurgitation. No evidence of mitral stenosis.  5. The aortic valve has an indeterminant number of cusps. There is mild  calcification of the aortic valve. There is mild thickening of the aortic  valve. Aortic valve regurgitation is not visualized. No aortic stenosis is  present.  6. The inferior vena cava is normal in size with greater than 50%  respiratory variability, suggesting right atrial pressure of 3 mmHg.   FINDINGS   Left Ventricle: Apical hypokinesis. Left ventricular ejection fraction,  by estimation, is 40 to 45%. The left ventricle has mildly decreased  function. The left ventricle demonstrates regional wall motion  abnormalities. The left ventricular internal  cavity size was normal in size. There is mild left ventricular  hypertrophy. Left ventricular diastolic parameters are indeterminate.   Right Ventricle: The right ventricular size is normal. No increase in  right ventricular wall thickness. Right ventricular systolic function is  normal.   Left Atrium: Left atrial size was mildly dilated.   Right Atrium: Right atrial size was normal in size.   Pericardium: There is no evidence of pericardial effusion.   Mitral Valve: The mitral valve is normal in structure. No evidence of  mitral valve regurgitation. No evidence of mitral valve stenosis.   Tricuspid Valve: The tricuspid valve is normal in structure. Tricuspid  valve regurgitation is not demonstrated. No evidence of tricuspid  stenosis.   Aortic Valve: The aortic valve has an indeterminant number of cusps. There  is mild calcification of the aortic valve. There is mild thickening of the  aortic valve. There is mild aortic valve annular calcification. Aortic  valve regurgitation is not  visualized. No aortic stenosis is present. Aortic valve mean gradient  measures 4.9 mmHg. Aortic valve peak gradient measures 8.7 mmHg. Aortic  valve area, by VTI measures 2.09 cm.   Pulmonic Valve: The pulmonic valve was not well visualized. Pulmonic valve  regurgitation is not visualized. No evidence of pulmonic stenosis.   Aorta: The aortic root is normal in size and structure.   Pulmonary Artery: Indeterminant PASP, inadequate TR jet.   Venous: The inferior vena cava is normal in size with greater than 50%  respiratory variability, suggesting right atrial pressure of 3 mmHg.   IAS/Shunts: No atrial level shunt detected by color flow  Doppler.     LEFT VENTRICLE  PLAX 2D  LVIDd:     5.30 cm Diastology  LVIDs:     4.50 cm LV e' medial:  7.51 cm/s  LV PW:     1.00 cm LV E/e' medial: 14.2  LV IVS:    1.20  cm LV e' lateral:  7.94 cm/s  LVOT diam:   2.10 cm LV E/e' lateral: 13.5  LV SV:     54  LV SV Index:  23  LVOT Area:   3.46 cm     RIGHT VENTRICLE  RV S prime:   16.00 cm/s  TAPSE (M-mode): 2.5 cm   LEFT ATRIUM      Index    RIGHT ATRIUM      Index  LA diam:   3.60 cm 1.55 cm/m RA Area:   17.60 cm  LA Vol (A2C): 72.2 ml 31.16 ml/m RA Volume:  52.60 ml 22.70 ml/m  LA Vol (A4C): 72.8 ml 31.41 ml/m  AORTIC VALVE  AV Area (Vmax):  2.03 cm  AV Area (Vmean):  1.81 cm  AV Area (VTI):   2.09 cm  AV Vmax:      147.67 cm/s  AV Vmean:     106.547 cm/s  AV VTI:      0.259 m  AV Peak Grad:   8.7 mmHg  AV Mean Grad:   4.9 mmHg  LVOT Vmax:     86.50 cm/s  LVOT Vmean:    55.600 cm/s  LVOT VTI:     0.156 m  LVOT/AV VTI ratio: 0.60    AORTA  Ao Root diam: 3.20 cm    Echocardiogram: 03/2018 IMPRESSIONS  1. The left ventricle has normal systolic function, with an ejection fraction of 55-60%. The cavity size was normal. There is mildly increased left ventricular wall thickness. Left ventricular diastolic Doppler parameters are indeterminate Indeterminent filling pressures. 2. The mitral valve is normal in structure. Moderate thickening of the mitral valve leaflet. No evidence of mitral valve stenosis. 3. The aortic valve has an indeterminant number of cusps. Cannot exclude bicuspid aortic valve. Moderately thickened valve 4. The aortic root is normal in size and structure. 5. The right ventricle has normal systolic function. The cavity was normal. There is no increase in right ventricular wall thickness. 6. The tricuspid valve is normal in structure. 7. Pulmonary hypertension is indeterminant,  inadequate TR jet. 8. The interatrial septum was not well visualized.  Patient Profile     70 y.o. 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 Significant demand ischemia and acute anemia.    Assessment & Plan    NSTEMI due to demand ischemia, though on some EKGS ST elevation - this AM EKG is improved, pain free.  He has rec'd unit PRBCs  Was placed on heparin and NTG with improved chest pain and SOB.  Troponin now 12.625.   Was to have cath yesterday but with acute anemia held.  Will have MD see today to eval for cath today or tomorrow. --he is pain free.   --ST at 120 and BP upper 40J systolic now.  ? Need another unit of PRBCs?    --NPO until Dr. Marlou Porch sees  Hypertensive emergency placed on IV NTG  Now on amlodipine , coreg and torsemide --BP today 150/77   Acute pulmonary edema with decrease in EF from 55-60% in 2020 to 40-45% now.  +NSTEMI  --+29 ml and wt down from 125 kg to 122.4 kg.   --Mg+ 2.0  K+ 3.6 keep K+ 4.0   AKI on admit but now Cr 1.33 at baseline.  After diuresis   Acute anemia with hgb 7.6 and has rec'd PRBCs 2 units and Hgb to 7.9   His normal hgb in 9.2 or so.   HLD now with LDL 71 HDL 39 and TG 188.    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       For questions or updates, please contact [place] Please consult [email or web address] for contact info under        Signed, Cecilie Kicks, NP  03/21/2020, 6:47 AM    Personally seen and examined. Agree with above.   70 year old transfer from [facility] with non-ST elevation myocardial infarction, rising troponins up to 12,000 with markedly abnormal EKG yesterday at around  9 AM with ST elevations in the inferior and lateral precordial leads that spontaneously resolved and are now nonspecific.  He is laying in bed, below-knee amputations bilaterally.  No current chest pain.  He has had issues in the past with anemia and has had GI work-up in the past which is unremarkable he states.  He received 1 unit of blood at [facility], current hemoglobin is 7.9.  We are going to give him another unit here.  Currently on IV heparin, aspirin 81, Plavix 75.  Plan will be to proceed with cardiac catheterization after he receives his unit of blood.  Concerned about possible occlusive disease in RCA potentially or multivessel disease.  DNR will be reversed for cardiac catheterization.  Risks and benefits discussed including stroke heart attack death renal apparent bleeding.  He is willing to proceed.  Candee Furbish, MD

## 2020-03-21 NOTE — Progress Notes (Signed)
PROGRESS NOTE    Cory Weber  YPP:[id number] DOB: Jul 11, 1950 DOA: 03/20/2020 PCP: Iona Beard, MD   Brief Narrative:  HPI On 03/21/2020 by Dr. Irwin Brakeman Cory Weber is a 70 y.o. male with medical history significant chronic kidney disease, CVA, hepatitis C, bilateral BKA, hyperlipidemia, BPH, GERD, anemia, asthma and other history detailed below presented to ED from [facility] with complaints of acute onset of shortness of breath.  Pt is oxygen dependent and normally wears 2L at baseline.  His pulse ox dropped into the 70s and he was placed on a nonrebreather when he arrived.  He has no cough or fever.  He had covid 2 months ago.  He denies chest pain, fever, diarrhea.  He does report that he has had occasional black stool over last month.  He is having trouble lying flat due to severe shortness of breath. He denies chest pain now but did have chest pain intermittently several days ago. He lives at a SNF and receives medications daily.  He reports no loss of appetite.    Interim history Patient found to have acute hypoxic respiratory failure secondary to heart failure exacerbation and NSTEMI. Patient [facility] to [facility] for cardiac catheterization. Assessment & Plan   NSTEMI -Possibly secondary to demand ischemia from heart failure as well as respiratory failure -Currently patient is chest pain-free -Placed on heparin and nitroglycerin -High-sensitivity troponin peaked at [facility] -Cardiology consulted and appreciated -Pending heart catheterization today  Acute systolic/diastolic heart failure exacerbation with acute hypoxic respiratory failure and pulmonary edema -Patient presented with hypoxia with oxygen saturations in the 70s and had to be placed on nonrebreather on arrival-currently on 3 L -Chest x-ray reviewed and showed pulmonary edema -BNP 213 -Echocardiogram showed an EF of 40 to 45%, LV mildly decreased function with regional wall  motion abnormalities. Mild LVH. LV diastolic parameters indeterminate. -Continue torsemide -Monitor intake and output, daily weights -Cardiology following and appreciated  Hypertensive urgency -BP is improving after starting diuresis as well as nitroglycerin -Continue amlodipine, Coreg, torsemide  Hyperlipidemia -Continue statin  History of CVA -No residual effects -Continue aspirin, statin, Plavix  GERD -Continue PPI  Chronic hepatitis C  -Continue to follow-up with outpatient gastroenterology  Diabetes mellitus, type II with nephropathy -Continue Lantus, insulin sliding scale and CBG monitoring  Anemia of chronic disease -Baseline hemoglobin approximately 9 -On admission, hemoglobin was 7.6, blood transfusion ordered -Hemoglobin today 7.9 -Continue to monitor CBC  PVD -Status post L BKA in 2017, right 2018 -Continue statin, aspirin, Plavix  Morbid obesity -BMI 42.27 -Patient to follow-up with PCP for lifestyle modifications  Chronic kidney disease, stage IIIa -Creatinine appears to be at baseline, continue to monitor closely  DVT Prophylaxis Heparin  Code Status: DNR  Family Communication: None at bedside  Disposition Plan:  Status is: Inpatient  Remains inpatient appropriate because:Inpatient level of care appropriate due to severity of illness   Dispo: The patient is from: Home              Anticipated d/c is to: Home              Anticipated d/c date is: 2 days              Patient currently is not medically stable to d/c.   Difficult to place patient No   Consultants Cardiology  Procedures  Echocardiogram  Antibiotics   Anti-infectives (From admission, onward)   None      Subjective:  Cory Weber seen and examined today. Patient with no complaints this morning. Would like to have his procedure done sooner rather than later. Currently denies chest pain. Feels breathing has improved since coming to the hospital. Denies abdominal pain,  nausea or vomiting, diarrhea or constipation, dizziness or headache.  Objective:   Vitals:   03/21/20 0615 03/21/20 0851 03/21/20 1037 03/21/20 1109  BP: (!) 150/77 134/86 (!) 148/89 124/90  Pulse:  98 (!) 113 (!) 108  Resp: 20 19 20 18   Temp: 99.2 F (37.3 C) 98.7 F (37.1 C) 98.8 F (37.1 C) 99 F (37.2 C)  TempSrc: Oral Oral Oral Oral  SpO2: 96% 95% 95% 99%  Weight:        Intake/Output Summary (Last 24 hours) at 03/21/2020 1418 Last data filed at 03/21/2020 1000 Gross per 24 hour  Intake 699.41 ml  Output 1700 ml  Net -1000.59 ml   Filed Weights   03/20/20 0044 03/21/20 0606  Weight: 125 kg 122.4 kg    Exam  General: Well developed, chronically ill-appearing, NAD  HEENT: NCAT,  mucous membranes moist.   Cardiovascular: S1 S2 auscultated, tachycardic, regular, no murmur  Respiratory: Diminished breath sounds however clear  Abdomen: Soft, nontender, nondistended, + bowel sounds  Extremities: Bilateral BKA, no edema of the stumps  Neuro: AAOx3, nonfocal  Psych: pleasant, appropriate mood and affect   Data Reviewed: I have personally reviewed following labs and imaging studies  CBC: Recent Labs  Lab 03/20/20 0911 03/20/20 1725 03/21/20 0414  WBC 6.0  --  6.9  HGB 7.6* 7.9* 7.9*  HCT 25.5* 26.3* 26.1*  MCV 100.4*  --  97.8  PLT 256  --  081   Basic Metabolic Panel: Recent Labs  Lab 03/20/20 0324 03/21/20 0414  NA 134* 135  K 4.2 3.6  CL 98 95*  CO2 33* 33*  GLUCOSE 308* 140*  BUN 14 14  CREATININE 1.39* 1.33*  CALCIUM 8.5* 8.6*  MG  --  2.0   GFR: Estimated Creatinine Clearance: 67.5 mL/min (A) (by C-G formula based on SCr of 1.33 mg/dL (H)). Liver Function Tests: Recent Labs  Lab 03/20/20 0324  AST 26  ALT 10  ALKPHOS 51  BILITOT 0.3  PROT 8.9*  ALBUMIN 3.2*   No results for input(s): LIPASE, AMYLASE in the last 168 hours. No results for input(s): AMMONIA in the last 168 hours. Coagulation Profile: Recent Labs  Lab  03/20/20 0119  INR 1.0   Cardiac Enzymes: No results for input(s): CKTOTAL, CKMB, CKMBINDEX, TROPONINI in the last 168 hours. BNP (last 3 results) No results for input(s): PROBNP in the last 8760 hours. HbA1C: Recent Labs    03/20/20 1138  HGBA1C 6.6*   CBG: Recent Labs  Lab 03/20/20 1733 03/20/20 2237 03/20/20 2354 03/21/20 0606 03/21/20 1202  GLUCAP 205* 61* 103* 129* 162*   Lipid Profile: Recent Labs    03/21/20 0206  CHOL 148  HDL 39*  LDLCALC 71  TRIG 188*  CHOLHDL 3.8   Thyroid Function Tests: No results for input(s): TSH, T4TOTAL, FREET4, T3FREE, THYROIDAB in the last 72 hours. Anemia Panel: No results for input(s): VITAMINB12, FOLATE, FERRITIN, TIBC, IRON, RETICCTPCT in the last 72 hours. Urine analysis:    Component Value Date/Time   COLORURINE STRAW (A) 03/20/2020 0600   APPEARANCEUR CLEAR 03/20/2020 0600   LABSPEC 1.010 03/20/2020 0600   PHURINE 5.0 03/20/2020 0600   GLUCOSEU 50 (A) 03/20/2020 0600   HGBUR NEGATIVE 03/20/2020 0600   BILIRUBINUR NEGATIVE  03/20/2020 0600   KETONESUR NEGATIVE 03/20/2020 0600   PROTEINUR 30 (A) 03/20/2020 0600   UROBILINOGEN 1.0 11/29/2013 1705   NITRITE NEGATIVE 03/20/2020 0600   LEUKOCYTESUR TRACE (A) 03/20/2020 0600   Sepsis Labs: @LABRCNTIP (procalcitonin:4,lacticidven:4)  ) Recent Results (from the past 240 hour(s))  Culture, blood (Routine x 2)     Status: None (Preliminary result)   Collection Time: 03/20/20  1:15 AM   Specimen: BLOOD  Result Value Ref Range Status   Specimen Description BLOOD BLOOD LEFT HAND  Final   Special Requests   Final    BOTTLES DRAWN AEROBIC AND ANAEROBIC Blood Culture adequate volume   Culture   Final    NO GROWTH 1 DAY Performed at [facility], [address], [place], [place] [address]    Report Status PENDING  Incomplete  Culture, blood (Routine x 2)     Status: None (Preliminary result)   Collection Time: 03/20/20  1:19 AM   Specimen: BLOOD  Result Value Ref Range  Status   Specimen Description BLOOD SITE NOT SPECIFIED  Final   Special Requests   Final    BOTTLES DRAWN AEROBIC AND ANAEROBIC Blood Culture adequate volume   Culture   Final    NO GROWTH 1 DAY Performed at [facility], [address] [place], [place] [address]    Report Status PENDING  Incomplete  SARS Coronavirus 2 by RT PCR (hospital order, performed in [place] hospital lab) Nasopharyngeal Nasopharyngeal Swab     Status: None   Collection Time: 03/20/20  1:20 AM   Specimen: Nasopharyngeal Swab  Result Value Ref Range Status   SARS Coronavirus 2 NEGATIVE NEGATIVE Final    Comment: (NOTE) SARS-CoV-2 target nucleic acids are NOT DETECTED.  The SARS-CoV-2 RNA is generally detectable in upper and lower respiratory specimens during the acute phase of infection. The lowest concentration of SARS-CoV-2 viral copies this assay can detect is 250 copies / mL. A negative result does not preclude SARS-CoV-2 infection and should not be used as the sole basis for treatment or other patient management decisions.  A negative result may occur with improper specimen collection / handling, submission of specimen other than nasopharyngeal swab, presence of viral mutation(s) within the areas targeted by this assay, and inadequate number of viral copies (<250 copies / mL). A negative result must be combined with clinical observations, patient history, and epidemiological information.  Fact Sheet for Patients:   StrictlyIdeas.no  Fact Sheet for Healthcare Providers: BankingDealers.co.za  This test is not yet approved or  cleared by the [place] FDA and has been authorized for detection and/or diagnosis of SARS-CoV-2 by FDA under an Emergency Use Authorization (EUA).  This EUA will remain in effect (meaning this test can be used) for the duration of the COVID-19 declaration under Section 564(b)(1) of the Act, 21 U.S.C. section 360bbb-3(b)(1),  unless the authorization is terminated or revoked sooner.  Performed at [facility], [address] [place], [place] [address]   MRSA PCR Screening     Status: Abnormal   Collection Time: 03/21/20  6:59 AM   Specimen: Nasal Mucosa; Nasopharyngeal  Result Value Ref Range Status   MRSA by PCR POSITIVE (A) NEGATIVE Final    Comment:        The GeneXpert MRSA Assay (FDA approved for NASAL specimens only), is one component of a comprehensive MRSA colonization surveillance program. It is not intended to diagnose MRSA infection nor to guide or monitor treatment for MRSA infections. RESULT CALLED  TO, READ BACK BY AND VERIFIED WITH: RN L.KING AT 0240 ON 03/21/2020 BY T.SAAD Performed at [facility] [address] [place], [place] [address]       Radiology Studies: DG Chest Port 1 View  Result Date: 03/20/2020 CLINICAL DATA:  Shortness of breath EXAM: PORTABLE CHEST 1 VIEW COMPARISON:  October 21, 2019 FINDINGS: The heart size and mediastinal contours are within normal limits. Mild diffuse interstitial opacities are seen throughout both lungs. No pleural effusion. No acute osseous abnormality. IMPRESSION: Mildly increased interstitial markings throughout both lungs which could be due to interstitial edema or infectious etiology. Electronically Signed   By: Prudencio Pair M.D.   On: 03/20/2020 01:37   ECHOCARDIOGRAM COMPLETE  Result Date: 03/20/2020    ECHOCARDIOGRAM REPORT   Patient Name:   BEUFORD GARCILAZO Date of Exam: 03/20/2020 Medical Rec #:  [id number]        Height:       67.0 in Accession #:    [id number]       Weight:       275.6 lb Date of Birth:  06-20-1950        BSA:          2.317 m Patient Age:    37 years         BP:           158/66 mmHg Patient Gender: M                HR:           99 bpm. Exam Location:  [facility] Procedure: 2D Echo, Cardiac Doppler and Color Doppler Indications:    Chest Pain R07.9  History:        Patient has prior history of Echocardiogram  examinations, most                 recent 04/01/2018. Risk Factors:Hypertension, Diabetes and                 Dyslipidemia. ETOH abuse, PAD (peripheral artery disease),                 Bilateral BKA, Tobacco abuse.  Sonographer:    Alvino Chapel RCS Referring Phys: [id number] [place]  1. Apical hypokinesis. . Left ventricular ejection fraction, by estimation, is 40 to 45%. The left ventricle has mildly decreased function. The left ventricle demonstrates regional wall motion abnormalities (see scoring diagram/findings for description). There is mild left ventricular hypertrophy. Left ventricular diastolic parameters are indeterminate.  2. Right ventricular systolic function is normal. The right ventricular size is normal.  3. Left atrial size was mildly dilated.  4. The mitral valve is normal in structure. No evidence of mitral valve regurgitation. No evidence of mitral stenosis.  5. The aortic valve has an indeterminant number of cusps. There is mild calcification of the aortic valve. There is mild thickening of the aortic valve. Aortic valve regurgitation is not visualized. No aortic stenosis is present.  6. The inferior vena cava is normal in size with greater than 50% respiratory variability, suggesting right atrial pressure of 3 mmHg. FINDINGS  Left Ventricle: Apical hypokinesis. Left ventricular ejection fraction, by estimation, is 40 to 45%. The left ventricle has mildly decreased function. The left ventricle demonstrates regional wall motion abnormalities. The left ventricular internal cavity size was normal in size. There is mild left ventricular hypertrophy. Left ventricular diastolic parameters are indeterminate. Right Ventricle: The right ventricular size is normal.  No increase in right ventricular wall thickness. Right ventricular systolic function is normal. Left Atrium: Left atrial size was mildly dilated. Right Atrium: Right atrial size was normal in size. Pericardium: There is no  evidence of pericardial effusion. Mitral Valve: The mitral valve is normal in structure. No evidence of mitral valve regurgitation. No evidence of mitral valve stenosis. Tricuspid Valve: The tricuspid valve is normal in structure. Tricuspid valve regurgitation is not demonstrated. No evidence of tricuspid stenosis. Aortic Valve: The aortic valve has an indeterminant number of cusps. There is mild calcification of the aortic valve. There is mild thickening of the aortic valve. There is mild aortic valve annular calcification. Aortic valve regurgitation is not visualized. No aortic stenosis is present. Aortic valve mean gradient measures 4.9 mmHg. Aortic valve peak gradient measures 8.7 mmHg. Aortic valve area, by VTI measures 2.09 cm. Pulmonic Valve: The pulmonic valve was not well visualized. Pulmonic valve regurgitation is not visualized. No evidence of pulmonic stenosis. Aorta: The aortic root is normal in size and structure. Pulmonary Artery: Indeterminant PASP, inadequate TR jet. Venous: The inferior vena cava is normal in size with greater than 50% respiratory variability, suggesting right atrial pressure of 3 mmHg. IAS/Shunts: No atrial level shunt detected by color flow Doppler.  LEFT VENTRICLE PLAX 2D LVIDd:         5.30 cm  Diastology LVIDs:         4.50 cm  LV e' medial:    7.51 cm/s LV PW:         1.00 cm  LV E/e' medial:  14.2 LV IVS:        1.20 cm  LV e' lateral:   7.94 cm/s LVOT diam:     2.10 cm  LV E/e' lateral: 13.5 LV SV:         54 LV SV Index:   23 LVOT Area:     3.46 cm  RIGHT VENTRICLE RV S prime:     16.00 cm/s TAPSE (M-mode): 2.5 cm LEFT ATRIUM           Index       RIGHT ATRIUM           Index LA diam:      3.60 cm 1.55 cm/m  RA Area:     17.60 cm LA Vol (A2C): 72.2 ml 31.16 ml/m RA Volume:   52.60 ml  22.70 ml/m LA Vol (A4C): 72.8 ml 31.41 ml/m  AORTIC VALVE AV Area (Vmax):    2.03 cm AV Area (Vmean):   1.81 cm AV Area (VTI):     2.09 cm AV Vmax:           147.67 cm/s AV Vmean:           106.547 cm/s AV VTI:            0.259 m AV Peak Grad:      8.7 mmHg AV Mean Grad:      4.9 mmHg LVOT Vmax:         86.50 cm/s LVOT Vmean:        55.600 cm/s LVOT VTI:          0.156 m LVOT/AV VTI ratio: 0.60  AORTA Ao Root diam: 3.20 cm MITRAL VALVE MV Area (PHT): 2.75 cm     SHUNTS MV Decel Time: 276 msec     Systemic VTI:  0.16 m MV E velocity: 107.00 cm/s  Systemic Diam: 2.10 cm Carlyle Dolly MD Electronically signed by Roderic Palau  Branch MD Signature Date/Time: 03/20/2020/12:40:27 PM    Final      Scheduled Meds: . amLODipine  5 mg Oral Daily  . vitamin C  500 mg Oral BID  . aspirin EC  81 mg Oral Daily  . carvedilol  6.25 mg Oral BID WC  . Chlorhexidine Gluconate Cloth  6 each Topical Q0600  . clopidogrel  75 mg Oral Q breakfast  . DULoxetine  60 mg Oral Daily  . ferrous sulfate  325 mg Oral Q breakfast  . insulin aspart  0-15 Units Subcutaneous TID WC  . insulin aspart  0-5 Units Subcutaneous QHS  . insulin aspart  8 Units Subcutaneous TID AC  . insulin glargine  30 Units Subcutaneous QHS  . lidocaine  1 patch Transdermal Daily  . mupirocin ointment  1 application Nasal BID  . pantoprazole  40 mg Oral Daily  . polyethylene glycol  17 g Oral Daily  . pregabalin  75 mg Oral BID  . rosuvastatin  5 mg Oral Daily  . sodium chloride flush  3 mL Intravenous Q12H  . tamsulosin  0.4 mg Oral Daily  . torsemide  40 mg Oral Daily  . cyanocobalamin  500 mcg Oral Daily   Continuous Infusions: . [START ON 03/22/2020] sodium chloride    . heparin 1,600 Units/hr (03/21/20 1151)  . nitroGLYCERIN 10 mcg/min (03/21/20 0721)     LOS: 1 day   Time Spent in minutes   45 minutes  Kadar Chance D.O. on 03/21/2020 at 2:18 PM  Between 7am to 7pm - Please see pager noted on [email or web address]  After 7pm go to [email or web address]  And look for the night coverage person covering for me after hours  [facility] Office  [phone]

## 2020-03-21 NOTE — Progress Notes (Signed)
Inpatient Diabetes Program Recommendations  AACE/ADA: New Consensus Statement on Inpatient Glycemic Control (2015)  Target Ranges:  Prepandial:   less than 140 mg/dL      Peak postprandial:   less than 180 mg/dL (1-2 hours)      Critically ill patients:  140 - 180 mg/dL   Lab Results  Component Value Date   GLUCAP 129 (H) 03/21/2020   HGBA1C 6.6 (H) 03/20/2020    Review of Glycemic Control Results for Cory Weber, Cory Weber (MRN [id number]) as of 03/21/2020 10:14  Ref. Range 03/20/2020 17:33 03/20/2020 22:37 03/20/2020 23:54 03/21/2020 06:06  Glucose-Capillary Latest Ref Range: 70 - 99 mg/dL 205 (H) 61 (L) 103 (H) 129 (H)   Diabetes history: Type 2 DM Outpatient Diabetes medications: Lantus 30 units QHS, Novolog 8 units TID, Metformin 850 mg BID Current orders for Inpatient glycemic control: Novolog 0-15 untis TID, Novolog 8 units TID, Lantus 30 units QHS  Inpatient Diabetes Program Recommendations:    Patient experienced hypoglycemic episode of 61 mg/dL following meal coverage and correction yesterday. Patient is current NPO for possible cath. Did not receive QHS dose of Lantus.   Consider : -Decreasing Lantus to 15 units QHS -If to remain NPO, change correction to Novolog 0-9 units Q4H.  -Once diet to continue, decrease meal coverage to 5 units TID (Assuming patient is consuming >50% of meals).   Thanks, Bronson Curb, MSN, RNC-OB Diabetes Coordinator [phone] (8a-5p)

## 2020-03-21 NOTE — TOC Progression Note (Signed)
Transition of Care [facility]) - Progression Note    Patient Details  Name: LALO TROMP MRN: [id number] Date of Birth: 12-06-1950  Transition of Care [facility]) CM/SW [place], [place] Phone Number: 03/21/2020, 3:10 PM  Clinical Narrative:     CSW called Debbie with [facility] SNF. She explained pt would not need a new covid test if he discharges in the next 2 days.    Expected Discharge Plan: Long Term Nursing Home Barriers to Discharge: Continued Medical Work up  Expected Discharge Plan and Services Expected Discharge Plan: [place] In-house Referral: Clinical Social Work Discharge Planning Services: NA Post Acute Care Choice: Nursing Home Living arrangements for the past 2 months: [place]                 DME Arranged: N/A DME Agency: NA       HH Arranged: NA HH Agency: NA         Social Determinants of Health (SDOH) Interventions    Readmission Risk Interventions Readmission Risk Prevention Plan 03/20/2020  Transportation Screening Complete  PCP or Specialist Appt within 3-5 Days Not Complete  Not Complete comments Patient resides at [facility] and the facility schedules hospital follow up appointments.  [place] or Home Care Consult Complete  Social Work Consult for [place] Planning/Counseling Complete  Palliative Care Screening Not Applicable  Medication Review Press photographer) Complete  Some recent data might be hidden

## 2020-03-21 NOTE — H&P (View-Only) (Signed)
Progress Note  Patient Name: Cory Weber Date of Encounter: 03/21/2020  [facility] Cardiologist: No primary care provider on file. Dr. Harl Bowie   Subjective   No chest pain, no SOB, BP a little low  Inpatient Medications    Scheduled Meds: . amLODipine  5 mg Oral Daily  . vitamin C  500 mg Oral BID  . aspirin EC  81 mg Oral Daily  . carvedilol  6.25 mg Oral BID WC  . clopidogrel  75 mg Oral Q breakfast  . DULoxetine  60 mg Oral Daily  . ferrous sulfate  325 mg Oral Q breakfast  . insulin aspart  0-15 Units Subcutaneous TID WC  . insulin aspart  0-5 Units Subcutaneous QHS  . insulin aspart  8 Units Subcutaneous TID AC  . insulin glargine  30 Units Subcutaneous QHS  . lidocaine  1 patch Transdermal Daily  . pantoprazole  40 mg Oral Daily  . polyethylene glycol  17 g Oral Daily  . pregabalin  75 mg Oral BID  . rosuvastatin  5 mg Oral Daily  . tamsulosin  0.4 mg Oral Daily  . torsemide  40 mg Oral Daily  . cyanocobalamin  500 mcg Oral Daily   Continuous Infusions: . heparin 1,450 Units/hr (03/21/20 0433)  . nitroGLYCERIN 50 mcg/min (03/21/20 0422)   PRN Meds: acetaminophen, baclofen, ipratropium-albuterol, ondansetron (ZOFRAN) IV   Vital Signs    Vitals:   03/21/20 0415 03/21/20 0445 03/21/20 0606 03/21/20 0615  BP: 137/77 (!) 130/91  (!) 150/77  Pulse: (!) 109 (!) 111    Resp: (!) 23 (!) 24  20  Temp:    99.2 F (37.3 C)  TempSrc:    Oral  SpO2: 100% 98%  96%  Weight:   122.4 kg     Intake/Output Summary (Last 24 hours) at 03/21/2020 0647 Last data filed at 03/21/2020 0433 Gross per 24 hour  Intake 939.41 ml  Output 1225 ml  Net -285.59 ml   Last 3 Weights 03/21/2020 03/20/2020 10/23/2019  Weight (lbs) 269 lb 14.4 oz 275 lb 9.2 oz 272 lb 0.8 oz  Weight (kg) 122.426 kg 125 kg 123.4 kg      Telemetry    ST  120s - Personally Reviewed  ECG    SR with improved ST elevation in ant. leads - Personally Reviewed  Physical Exam   GEN: No acute  distress.   Neck: mild lJVD Cardiac: RRR, no murmurs, rubs, or gallops.  Respiratory: Clear to diminished to auscultation bilaterally. GI: Soft, nontender, non-distended  MS: No edema; bilateral amputations Neuro:  Nonfocal  Psych: Normal affect   Labs    High Sensitivity Troponin:   Recent Labs  Lab 03/20/20 0133 03/20/20 0324 03/21/20 0206 03/21/20 0414  TROPONINIHS 2,819* 3,337* 9,585* 12,625*      Chemistry Recent Labs  Lab 03/20/20 0324 03/21/20 0414  NA 134* 135  K 4.2 3.6  CL 98 95*  CO2 33* 33*  GLUCOSE 308* 140*  BUN 14 14  CREATININE 1.39* 1.33*  CALCIUM 8.5* 8.6*  PROT 8.9*  --   ALBUMIN 3.2*  --   AST 26  --   ALT 10  --   ALKPHOS 51  --   BILITOT 0.3  --   GFRNONAA 56* 59*  ANIONGAP 3* 7     Hematology Recent Labs  Lab 03/20/20 0911 03/20/20 1725 03/21/20 0414  WBC 6.0  --  6.9  RBC 2.54*  --  2.67*  HGB 7.6* 7.9* 7.9*  HCT 25.5* 26.3* 26.1*  MCV 100.4*  --  97.8  MCH 29.9  --  29.6  MCHC 29.8*  --  30.3  RDW 14.5  --  15.1  PLT 256  --  226    BNP Recent Labs  Lab 03/20/20 0119  BNP 213.0*     DDimer No results for input(s): DDIMER in the last 168 hours.   Radiology    DG Chest Port 1 View  Result Date: 03/20/2020 CLINICAL DATA:  Shortness of breath EXAM: PORTABLE CHEST 1 VIEW COMPARISON:  October 21, 2019 FINDINGS: The heart size and mediastinal contours are within normal limits. Mild diffuse interstitial opacities are seen throughout both lungs. No pleural effusion. No acute osseous abnormality. IMPRESSION: Mildly increased interstitial markings throughout both lungs which could be due to interstitial edema or infectious etiology. Electronically Signed   By: Prudencio Pair M.D.   On: 03/20/2020 01:37   ECHOCARDIOGRAM COMPLETE  Result Date: 03/20/2020    ECHOCARDIOGRAM REPORT   Patient Name:   Cory Weber Date of Exam: 03/20/2020 Medical Rec #:  [id number]        Height:       67.0 in Accession #:    [id number]       Weight:        275.6 lb Date of Birth:  December 07, 1950        BSA:          2.317 m Patient Age:    70 years         BP:           158/66 mmHg Patient Gender: M                HR:           99 bpm. Exam Location:  [facility] Procedure: 2D Echo, Cardiac Doppler and Color Doppler Indications:    Chest Pain R07.9  History:        Patient has prior history of Echocardiogram examinations, most                 recent 04/01/2018. Risk Factors:Hypertension, Diabetes and                 Dyslipidemia. ETOH abuse, PAD (peripheral artery disease),                 Bilateral BKA, Tobacco abuse.  Sonographer:    Alvino Chapel RCS Referring Phys: [id number] [place]  1. Apical hypokinesis. . Left ventricular ejection fraction, by estimation, is 40 to 45%. The left ventricle has mildly decreased function. The left ventricle demonstrates regional wall motion abnormalities (see scoring diagram/findings for description). There is mild left ventricular hypertrophy. Left ventricular diastolic parameters are indeterminate.  2. Right ventricular systolic function is normal. The right ventricular size is normal.  3. Left atrial size was mildly dilated.  4. The mitral valve is normal in structure. No evidence of mitral valve regurgitation. No evidence of mitral stenosis.  5. The aortic valve has an indeterminant number of cusps. There is mild calcification of the aortic valve. There is mild thickening of the aortic valve. Aortic valve regurgitation is not visualized. No aortic stenosis is present.  6. The inferior vena cava is normal in size with greater than 50% respiratory variability, suggesting right atrial pressure of 3 mmHg. FINDINGS  Left Ventricle: Apical hypokinesis. Left ventricular ejection fraction, by estimation, is 40 to 45%. The left ventricle  has mildly decreased function. The left ventricle demonstrates regional wall motion abnormalities. The left ventricular internal cavity size was normal in size. There is mild left  ventricular hypertrophy. Left ventricular diastolic parameters are indeterminate. Right Ventricle: The right ventricular size is normal. No increase in right ventricular wall thickness. Right ventricular systolic function is normal. Left Atrium: Left atrial size was mildly dilated. Right Atrium: Right atrial size was normal in size. Pericardium: There is no evidence of pericardial effusion. Mitral Valve: The mitral valve is normal in structure. No evidence of mitral valve regurgitation. No evidence of mitral valve stenosis. Tricuspid Valve: The tricuspid valve is normal in structure. Tricuspid valve regurgitation is not demonstrated. No evidence of tricuspid stenosis. Aortic Valve: The aortic valve has an indeterminant number of cusps. There is mild calcification of the aortic valve. There is mild thickening of the aortic valve. There is mild aortic valve annular calcification. Aortic valve regurgitation is not visualized. No aortic stenosis is present. Aortic valve mean gradient measures 4.9 mmHg. Aortic valve peak gradient measures 8.7 mmHg. Aortic valve area, by VTI measures 2.09 cm. Pulmonic Valve: The pulmonic valve was not well visualized. Pulmonic valve regurgitation is not visualized. No evidence of pulmonic stenosis. Aorta: The aortic root is normal in size and structure. Pulmonary Artery: Indeterminant PASP, inadequate TR jet. Venous: The inferior vena cava is normal in size with greater than 50% respiratory variability, suggesting right atrial pressure of 3 mmHg. IAS/Shunts: No atrial level shunt detected by color flow Doppler.  LEFT VENTRICLE PLAX 2D LVIDd:         5.30 cm  Diastology LVIDs:         4.50 cm  LV e' medial:    7.51 cm/s LV PW:         1.00 cm  LV E/e' medial:  14.2 LV IVS:        1.20 cm  LV e' lateral:   7.94 cm/s LVOT diam:     2.10 cm  LV E/e' lateral: 13.5 LV SV:         54 LV SV Index:   23 LVOT Area:     3.46 cm  RIGHT VENTRICLE RV S prime:     16.00 cm/s TAPSE (M-mode): 2.5 cm  LEFT ATRIUM           Index       RIGHT ATRIUM           Index LA diam:      3.60 cm 1.55 cm/m  RA Area:     17.60 cm LA Vol (A2C): 72.2 ml 31.16 ml/m RA Volume:   52.60 ml  22.70 ml/m LA Vol (A4C): 72.8 ml 31.41 ml/m  AORTIC VALVE AV Area (Vmax):    2.03 cm AV Area (Vmean):   1.81 cm AV Area (VTI):     2.09 cm AV Vmax:           147.67 cm/s AV Vmean:          106.547 cm/s AV VTI:            0.259 m AV Peak Grad:      8.7 mmHg AV Mean Grad:      4.9 mmHg LVOT Vmax:         86.50 cm/s LVOT Vmean:        55.600 cm/s LVOT VTI:          0.156 m LVOT/AV VTI ratio: 0.60  AORTA Ao Root diam: 3.20 cm MITRAL  VALVE MV Area (PHT): 2.75 cm     SHUNTS MV Decel Time: 276 msec     Systemic VTI:  0.16 m MV E velocity: 107.00 cm/s  Systemic Diam: 2.10 cm Carlyle Dolly MD Electronically signed by Carlyle Dolly MD Signature Date/Time: 03/20/2020/12:40:27 PM    Final     Cardiac Studies   Echo 03/20/20 IMPRESSIONS    1. Apical hypokinesis. . Left ventricular ejection fraction, by  estimation, is 40 to 45%. The left ventricle has mildly decreased  function. The left ventricle demonstrates regional wall motion  abnormalities (see scoring diagram/findings for  description). There is mild left ventricular hypertrophy. Left ventricular  diastolic parameters are indeterminate.  2. Right ventricular systolic function is normal. The right ventricular  size is normal.  3. Left atrial size was mildly dilated.  4. The mitral valve is normal in structure. No evidence of mitral valve  regurgitation. No evidence of mitral stenosis.  5. The aortic valve has an indeterminant number of cusps. There is mild  calcification of the aortic valve. There is mild thickening of the aortic  valve. Aortic valve regurgitation is not visualized. No aortic stenosis is  present.  6. The inferior vena cava is normal in size with greater than 50%  respiratory variability, suggesting right atrial pressure of 3 mmHg.   FINDINGS   Left Ventricle: Apical hypokinesis. Left ventricular ejection fraction,  by estimation, is 40 to 45%. The left ventricle has mildly decreased  function. The left ventricle demonstrates regional wall motion  abnormalities. The left ventricular internal  cavity size was normal in size. There is mild left ventricular  hypertrophy. Left ventricular diastolic parameters are indeterminate.   Right Ventricle: The right ventricular size is normal. No increase in  right ventricular wall thickness. Right ventricular systolic function is  normal.   Left Atrium: Left atrial size was mildly dilated.   Right Atrium: Right atrial size was normal in size.   Pericardium: There is no evidence of pericardial effusion.   Mitral Valve: The mitral valve is normal in structure. No evidence of  mitral valve regurgitation. No evidence of mitral valve stenosis.   Tricuspid Valve: The tricuspid valve is normal in structure. Tricuspid  valve regurgitation is not demonstrated. No evidence of tricuspid  stenosis.   Aortic Valve: The aortic valve has an indeterminant number of cusps. There  is mild calcification of the aortic valve. There is mild thickening of the  aortic valve. There is mild aortic valve annular calcification. Aortic  valve regurgitation is not  visualized. No aortic stenosis is present. Aortic valve mean gradient  measures 4.9 mmHg. Aortic valve peak gradient measures 8.7 mmHg. Aortic  valve area, by VTI measures 2.09 cm.   Pulmonic Valve: The pulmonic valve was not well visualized. Pulmonic valve  regurgitation is not visualized. No evidence of pulmonic stenosis.   Aorta: The aortic root is normal in size and structure.   Pulmonary Artery: Indeterminant PASP, inadequate TR jet.   Venous: The inferior vena cava is normal in size with greater than 50%  respiratory variability, suggesting right atrial pressure of 3 mmHg.   IAS/Shunts: No atrial level shunt detected by color flow  Doppler.     LEFT VENTRICLE  PLAX 2D  LVIDd:     5.30 cm Diastology  LVIDs:     4.50 cm LV e' medial:  7.51 cm/s  LV PW:     1.00 cm LV E/e' medial: 14.2  LV IVS:    1.20  cm LV e' lateral:  7.94 cm/s  LVOT diam:   2.10 cm LV E/e' lateral: 13.5  LV SV:     54  LV SV Index:  23  LVOT Area:   3.46 cm     RIGHT VENTRICLE  RV S prime:   16.00 cm/s  TAPSE (M-mode): 2.5 cm   LEFT ATRIUM      Index    RIGHT ATRIUM      Index  LA diam:   3.60 cm 1.55 cm/m RA Area:   17.60 cm  LA Vol (A2C): 72.2 ml 31.16 ml/m RA Volume:  52.60 ml 22.70 ml/m  LA Vol (A4C): 72.8 ml 31.41 ml/m  AORTIC VALVE  AV Area (Vmax):  2.03 cm  AV Area (Vmean):  1.81 cm  AV Area (VTI):   2.09 cm  AV Vmax:      147.67 cm/s  AV Vmean:     106.547 cm/s  AV VTI:      0.259 m  AV Peak Grad:   8.7 mmHg  AV Mean Grad:   4.9 mmHg  LVOT Vmax:     86.50 cm/s  LVOT Vmean:    55.600 cm/s  LVOT VTI:     0.156 m  LVOT/AV VTI ratio: 0.60    AORTA  Ao Root diam: 3.20 cm    Echocardiogram: 03/2018 IMPRESSIONS  1. The left ventricle has normal systolic function, with an ejection fraction of 55-60%. The cavity size was normal. There is mildly increased left ventricular wall thickness. Left ventricular diastolic Doppler parameters are indeterminate Indeterminent filling pressures. 2. The mitral valve is normal in structure. Moderate thickening of the mitral valve leaflet. No evidence of mitral valve stenosis. 3. The aortic valve has an indeterminant number of cusps. Cannot exclude bicuspid aortic valve. Moderately thickened valve 4. The aortic root is normal in size and structure. 5. The right ventricle has normal systolic function. The cavity was normal. There is no increase in right ventricular wall thickness. 6. The tricuspid valve is normal in structure. 7. Pulmonary hypertension is indeterminant,  inadequate TR jet. 8. The interatrial septum was not well visualized.  Patient Profile     69 y.o. 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 Significant demand ischemia and acute anemia.    Assessment & Plan    NSTEMI due to demand ischemia, though on some EKGS ST elevation - this AM EKG is improved, pain free.  He has rec'd unit PRBCs  Was placed on heparin and NTG with improved chest pain and SOB.  Troponin now 12.625.   Was to have cath yesterday but with acute anemia held.  Will have MD see today to eval for cath today or tomorrow. --he is pain free.   --ST at 120 and BP upper 92J systolic now.  ? Need another unit of PRBCs?    --NPO until Dr. Marlou Porch sees  Hypertensive emergency placed on IV NTG  Now on amlodipine , coreg and torsemide --BP today 150/77   Acute pulmonary edema with decrease in EF from 55-60% in 2020 to 40-45% now.  +NSTEMI  --+29 ml and wt down from 125 kg to 122.4 kg.   --Mg+ 2.0  K+ 3.6 keep K+ 4.0   AKI on admit but now Cr 1.33 at baseline.  After diuresis   Acute anemia with hgb 7.6 and has rec'd PRBCs 2 units and Hgb to 7.9   His normal hgb in 9.2 or so.   HLD now with LDL 71 HDL 39 and TG 188.    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       For questions or updates, please contact [place] Please consult [email or web address] for contact info under        Signed, Cecilie Kicks, NP  03/21/2020, 6:47 AM    Personally seen and examined. Agree with above.   70 year old transfer from [facility] with non-ST elevation myocardial infarction, rising troponins up to 12,000 with markedly abnormal EKG yesterday at around  9 AM with ST elevations in the inferior and lateral precordial leads that spontaneously resolved and are now nonspecific.  He is laying in bed, below-knee amputations bilaterally.  No current chest pain.  He has had issues in the past with anemia and has had GI work-up in the past which is unremarkable he states.  He received 1 unit of blood at [facility], current hemoglobin is 7.9.  We are going to give him another unit here.  Currently on IV heparin, aspirin 81, Plavix 75.  Plan will be to proceed with cardiac catheterization after he receives his unit of blood.  Concerned about possible occlusive disease in RCA potentially or multivessel disease.  DNR will be reversed for cardiac catheterization.  Risks and benefits discussed including stroke heart attack death renal apparent bleeding.  He is willing to proceed.  Candee Furbish, MD

## 2020-03-21 NOTE — ED Notes (Signed)
Date and time results received: 03/21/20 5:11 AM  (use smartphrase ".now" to insert current time)  Test: Trop Critical Value: 12625  Name of Provider Notified: Dr Earnest Conroy   Orders Received? Or Actions Taken?: None at this time. [facility]link here to transport to [facility]

## 2020-03-21 NOTE — Interval H&P Note (Signed)
History and Physical Interval Note:  03/21/2020 3:26 PM  Cory Weber  has presented today for surgery, with the diagnosis of nstemi.  The various methods of treatment have been discussed with the patient and family. After consideration of risks, benefits and other options for treatment, the patient has consented to  Procedure(s): LEFT HEART CATH AND CORONARY ANGIOGRAPHY (N/A) as a surgical intervention.  The patient's history has been reviewed, patient examined, no change in status, stable for surgery.  I have reviewed the patient's chart and labs.  Questions were answered to the patient's satisfaction.   Cath Lab Visit (complete for each Cath Lab visit)  Clinical Evaluation Leading to the Procedure:   ACS: Yes.    Non-ACS:    Anginal Classification: CCS IV  Anti-ischemic medical therapy: Maximal Therapy (2 or more classes of medications)  Non-Invasive Test Results: No non-invasive testing performed  Prior CABG: No previous CABG        Collier Salina [facility] 03/21/2020 3:26 PM

## 2020-03-21 NOTE — ED Notes (Signed)
carelink arrived  

## 2020-03-21 NOTE — Progress Notes (Signed)
Brief Note:  Reviewed patient's ECG from 03/21/2020 02:13: Shows sinus tachycardia, rate 116 bpm, q waves in inferior leads, ST elevations in previous ECG are now resolved  ECG from 03/20/2020 09:29: Sinus rhythm, rate 97 bpm, ST elevations in V2-V6, II, III and aVF  High sensitivity troponin trend has been: 2819, 3337 and 9585  - Follow-up call with Hospitalist, agree with transfer to [place] see patient when he arrives here - Patient is currently chest pain free and resting comfortably (per report) - Continue IV heparin and IV NTG  Patient was originally evaluated on 03/20/2020 by the Cardiology service at [facility]. Plan was for cardiac catheterization in the AM.  According to [facility] patient being picked up now. Will page with ETA once patient is 10-15 minutes from [facility].  Melina Schools, MD, [facility]

## 2020-03-21 NOTE — Progress Notes (Signed)
RN pulled and wasted versed and fentanyl under incorrect patient. The medication records at [place] and 1012 under [name] were intended for and administered to patient Cory Weber. RN contacted pharmacy and pharmacy billing to update correct charges. All pharmacy charges to [facility] after 1500 are correct.

## 2020-03-21 NOTE — ED Provider Notes (Signed)
Reviewing ECGs done on this patient, ECG done at 0 929 on 03/20/2020 noted to show STEMI involving inferior and anterolateral leads.  Patient now is resting comfortably, on a nitroglycerin drip.  This ECG is not referenced on admission H&P or consult note.  I have discussed the ECG findings with Dr. Clearence Ped.   Delora Fuel, MD 54/86/28 [phone]

## 2020-03-21 NOTE — Progress Notes (Signed)
[place] for Heparin Indication: chest pain/ACS  Allergies  Allergen Reactions  . No Known Allergies     Patient Measurements: Weight: 125 kg (275 lb 9.2 oz)  Ht: 67 in IBW: 66 kg Heparin Dosing Weight: 94 kg  Vital Signs: Temp: 98.4 F (36.9 C) (02/07 1532) Temp Source: Oral (02/07 1532) BP: 149/84 (02/08 0115) Pulse Rate: 114 (02/08 0115)  Labs: Recent Labs    03/20/20 0119 03/20/20 0133 03/20/20 0324 03/20/20 0911 03/20/20 1725 03/21/20 0049  HGB  --   --   --  7.6* 7.9*  --   HCT  --   --   --  25.5* 26.3*  --   PLT  --   --   --  256  --   --   LABPROT 13.1  --   --   --   --   --   INR 1.0  --   --   --   --   --   HEPARINUNFRC  --   --   --  0.44 0.26* 0.39  CREATININE  --   --  1.39*  --   --   --   TROPONINIHS  --  2,819* 3,337*  --   --   --     Estimated Creatinine Clearance: 65.4 mL/min (A) (by C-G formula based on SCr of 1.39 mg/dL (H)).  Assessment: 70 y.o. M presents with SOB. Trop elevated to 2819. To begin heparin for ACS. No AC PTA. Patient reporting dark, tarry stools over the last few week. s/p PRBC 2/7. Plan for cath after more stable.  Heparin level therapeutic (0.39) on gtt at 1450 units/hr. No bleeding reported per RN.  Goal of Therapy:  Heparin level 0.3-0.7 units/ml Monitor platelets by anticoagulation protocol: Yes   Plan:  Continue heparin infusion at 1450 units/hr Daily heparin level and CBC  Sherlon Handing, PharmD, BCPS Please see amion for complete clinical pharmacist phone list 03/21/2020,1:50 AM

## 2020-03-22 ENCOUNTER — Encounter (HOSPITAL_COMMUNITY)
Admission: EM | Disposition: A | Discharge status: Skilled Nursing Facility | Payer: Self-pay | Source: Home / Self Care | Attending: Internal Medicine

## 2020-03-22 DIAGNOSIS — I214 Non-ST elevation (NSTEMI) myocardial infarction: Secondary | ICD-10-CM | POA: Diagnosis not present

## 2020-03-22 DIAGNOSIS — J810 Acute pulmonary edema: Secondary | ICD-10-CM | POA: Diagnosis not present

## 2020-03-22 LAB — TYPE AND SCREEN
ABO/RH(D): O POS
Antibody Screen: NEGATIVE
Unit division: 0

## 2020-03-22 LAB — CBC
HCT: 28.6 % — ABNORMAL LOW (ref 39.0–52.0)
Hemoglobin: 8.8 g/dL — ABNORMAL LOW (ref 13.0–17.0)
MCH: 28.9 pg (ref 26.0–34.0)
MCHC: 30.8 g/dL (ref 30.0–36.0)
MCV: 94.1 fL (ref 80.0–100.0)
Platelets: 250 10*3/uL (ref 150–400)
RBC: 3.04 MIL/uL — ABNORMAL LOW (ref 4.22–5.81)
RDW: 15.8 % — ABNORMAL HIGH (ref 11.5–15.5)
WBC: 6.7 10*3/uL (ref 4.0–10.5)
nRBC: 0 % (ref 0.0–0.2)

## 2020-03-22 LAB — BASIC METABOLIC PANEL
Anion gap: 7 (ref 5–15)
BUN: 14 mg/dL (ref 8–23)
CO2: 34 mmol/L — ABNORMAL HIGH (ref 22–32)
Calcium: 8.8 mg/dL — ABNORMAL LOW (ref 8.9–10.3)
Chloride: 95 mmol/L — ABNORMAL LOW (ref 98–111)
Creatinine, Ser: 1.45 mg/dL — ABNORMAL HIGH (ref 0.61–1.24)
GFR, Estimated: 53 mL/min — ABNORMAL LOW (ref >60)
Glucose, Bld: 207 mg/dL — ABNORMAL HIGH (ref 70–99)
Potassium: 4.3 mmol/L (ref 3.5–5.1)
Sodium: 136 mmol/L (ref 135–145)

## 2020-03-22 LAB — GLUCOSE, CAPILLARY
Glucose-Capillary: 181 mg/dL — ABNORMAL HIGH (ref 70–99)
Glucose-Capillary: 179 mg/dL — ABNORMAL HIGH (ref 70–99)
Glucose-Capillary: 266 mg/dL — ABNORMAL HIGH (ref 70–99)
Glucose-Capillary: 158 mg/dL — ABNORMAL HIGH (ref 70–99)

## 2020-03-22 LAB — BPAM RBC
Blood Product Expiration Date: 202203082359
ISSUE DATE / TIME: 202202080933
Unit Type and Rh: 5100

## 2020-03-22 LAB — MAGNESIUM: Magnesium: 2.1 mg/dL (ref 1.7–2.4)

## 2020-03-22 SURGERY — LEFT HEART CATH AND CORONARY ANGIOGRAPHY
Anesthesia: LOCAL

## 2020-03-22 MED ORDER — SENNA 8.6 MG PO TABS
1.0000 | ORAL_TABLET | Freq: Every day | ORAL | Status: DC
  Administered 2020-03-22 – 2020-04-08 (×15): 8.6 mg via ORAL
  Filled 2020-03-22 (×17): qty 1

## 2020-03-22 MED ORDER — ROSUVASTATIN CALCIUM 20 MG PO TABS
20.0000 mg | ORAL_TABLET | Freq: Every day | ORAL | Status: DC
  Administered 2020-03-23 – 2020-04-08 (×15): 20 mg via ORAL
  Filled 2020-03-22 (×16): qty 1

## 2020-03-22 MED ORDER — CARVEDILOL 12.5 MG PO TABS
12.5000 mg | ORAL_TABLET | Freq: Two times a day (BID) | ORAL | Status: DC
  Administered 2020-03-22 – 2020-03-23 (×2): 12.5 mg via ORAL
  Filled 2020-03-22 (×2): qty 1

## 2020-03-22 MED ORDER — FUROSEMIDE 10 MG/ML IJ SOLN: 40.0000 mg | Freq: Two times a day (BID) | INTRAMUSCULAR | Status: DC

## 2020-03-22 MED ORDER — HYDRALAZINE HCL 25 MG PO TABS
25.0000 mg | ORAL_TABLET | Freq: Three times a day (TID) | ORAL | Status: DC
  Administered 2020-03-22 – 2020-03-26 (×12): 25 mg via ORAL
  Filled 2020-03-22 (×12): qty 1

## 2020-03-22 MED ORDER — FUROSEMIDE 10 MG/ML IJ SOLN
80.0000 mg | Freq: Two times a day (BID) | INTRAMUSCULAR | Status: DC
  Administered 2020-03-22 – 2020-03-28 (×12): 80 mg via INTRAVENOUS
  Filled 2020-03-22 (×12): qty 8

## 2020-03-22 NOTE — Significant Event (Signed)
Patient Blood pressure remains elevated with titration of Nitro at 57mcg/min. See flow sheet.

## 2020-03-22 NOTE — Significant Event (Signed)
EKG complete with abnormal finding Handed to Attending MD and paged Cards Masters.

## 2020-03-22 NOTE — Progress Notes (Signed)
Entered room at 0215 to patient complaining of recurrent onset chest pain (7/10) without radiation or nausea.   BP elevated and O2 off.  Helped patient with nasal cannula and increased Nitro titration to 20 mcg/min.  Patient felt relief from chest pain almost immediately and continues to deny pain at this time.   Will continue to closely monitor for change in symptoms.

## 2020-03-22 NOTE — Progress Notes (Signed)
Progress Note  Patient Name: Cory Weber Date of Encounter: 03/22/2020  [facility] Cardiologist: No primary care provider on file.   Subjective   I saw him earlier this morning during an episode of diaphoresis.  After increasing the nitroglycerin, did feel better.  Inpatient Medications    Scheduled Meds: . amLODipine  5 mg Oral Daily  . vitamin C  500 mg Oral BID  . aspirin EC  81 mg Oral Daily  . carvedilol  6.25 mg Oral BID WC  . Chlorhexidine Gluconate Cloth  6 each Topical Q0600  . DULoxetine  60 mg Oral Daily  . ferrous sulfate  325 mg Oral Q breakfast  . heparin  5,000 Units Subcutaneous Q8H  . insulin aspart  0-15 Units Subcutaneous TID WC  . insulin aspart  0-5 Units Subcutaneous QHS  . insulin aspart  8 Units Subcutaneous TID AC  . insulin glargine  30 Units Subcutaneous QHS  . lidocaine  1 patch Transdermal Daily  . mupirocin ointment  1 application Nasal BID  . pantoprazole  40 mg Oral Daily  . polyethylene glycol  17 g Oral Daily  . pregabalin  75 mg Oral BID  . rosuvastatin  5 mg Oral Daily  . senna  1 tablet Oral Daily  . sodium chloride flush  3 mL Intravenous Q12H  . sodium chloride flush  3 mL Intravenous Q12H  . tamsulosin  0.4 mg Oral Daily  . torsemide  40 mg Oral Daily  . cyanocobalamin  500 mcg Oral Daily   Continuous Infusions: . sodium chloride    . nitroGLYCERIN 65 mcg/min (03/22/20 1244)   PRN Meds: sodium chloride, acetaminophen, baclofen, ipratropium-albuterol, ondansetron (ZOFRAN) IV, sodium chloride flush   Vital Signs    Vitals:   03/22/20 1036 03/22/20 1100 03/22/20 1141 03/22/20 1239  BP: (!) 144/83 137/89 127/86 (!) 150/93  Pulse:      Resp:  18    Temp: 97.7 F (36.5 C)     TempSrc:      SpO2:      Weight:        Intake/Output Summary (Last 24 hours) at 03/22/2020 1312 Last data filed at 03/22/2020 0956 Gross per 24 hour  Intake 672.17 ml  Output 1350 ml  Net -677.83 ml   Last 3 Weights 03/22/2020 03/21/2020  03/20/2020  Weight (lbs) 270 lb 4.5 oz 269 lb 14.4 oz 275 lb 9.2 oz  Weight (kg) 122.6 kg 122.426 kg 125 kg      Telemetry    No adverse arrhythmias- Personally Reviewed  ECG    Sinus tachycardia 104 ST segment elevation 2 3 aVF.  Dynamic change.- Personally Reviewed  Physical Exam   GEN: No acute distress.  Fairly pleasant this morning Neck: No JVD Cardiac: RRR, no murmurs, rubs, or gallops.  Respiratory: Clear to auscultation bilaterally. GI: Soft, nontender, non-distended  MS:  Bilateral below-knee amputations. Neuro:  Nonfocal  Psych: Normal affect   Labs    High Sensitivity Troponin:   Recent Labs  Lab 03/20/20 0133 03/20/20 0324 03/21/20 0206 03/21/20 0414  TROPONINIHS 2,819* 3,337* 9,585* 12,625*      Chemistry Recent Labs  Lab 03/20/20 0324 03/21/20 0414 03/21/20 0901 03/22/20 0243  NA 134* 135  --  136  K 4.2 3.6  --  4.3  CL 98 95*  --  95*  CO2 33* 33*  --  34*  GLUCOSE 308* 140*  --  207*  BUN 14 14  --  14  CREATININE 1.39* 1.33* 1.34* 1.45*  CALCIUM 8.5* 8.6*  --  8.8*  PROT 8.9*  --   --   --   ALBUMIN 3.2*  --   --   --   AST 26  --   --   --   ALT 10  --   --   --   ALKPHOS 51  --   --   --   BILITOT 0.3  --   --   --   GFRNONAA 56* 59* 58* 53*  ANIONGAP 3* 7  --  7     Hematology Recent Labs  Lab 03/20/20 0911 03/20/20 1725 03/21/20 0414 03/21/20 1516 03/22/20 0243  WBC 6.0  --  6.9  --  6.7  RBC 2.54*  --  2.67*  --  3.04*  HGB 7.6*   < > 7.9* 9.1* 8.8*  HCT 25.5*   < > 26.1* 29.4* 28.6*  MCV 100.4*  --  97.8  --  94.1  MCH 29.9  --  29.6  --  28.9  MCHC 29.8*  --  30.3  --  30.8  RDW 14.5  --  15.1  --  15.8*  PLT 256  --  226  --  250   < > = values in this interval not displayed.    BNP Recent Labs  Lab 03/20/20 0119  BNP 213.0*     DDimer No results for input(s): DDIMER in the last 168 hours.   Radiology    CARDIAC CATHETERIZATION  Result Date: 03/21/2020  Mid LM to Dist LM lesion is 70% stenosed.  Ost  LAD to Prox LAD lesion is 70% stenosed.  Ramus lesion is 70% stenosed.  Ost Cx to Mid Cx lesion is 99% stenosed.  Prox RCA lesion is 40% stenosed.  There is moderate left ventricular systolic dysfunction.  LV end diastolic pressure is severely elevated.  The left ventricular ejection fraction is 35-45% by visual estimate.  1. Severe complex CAD. There is a tapering 70% stenosis in the distal Left main involving as well the ostium of the LAD and ramus intermediate with hazy appearance. The LCx is a co-dominant vessel with extensive dissection involving the ostium and proximal vessel and is is subtotally occluded. 2. Moderate LV dysfunction. EF 40-45% 3. Severely elevated LVEDP 30 mm Hg. Plan: patient is not a candidate for PCI. He is likely not a candidate for CABG as well given multiple co-morbidities. Will discuss with rounding team. Likely manage medically. Prognosis is very poor.    Cardiac Studies   Cardiac catheterization 03/21/2020:       Patient Profile     70 y.o. male with severe multivessel coronary artery disease, non-ST elevation myocardial infarction, acute systolic heart failure, bilateral below-knee amputations, unexplained ongoing anemia requiring 2 units of blood  -Continue with aggressive medical management and antianginal support.  I was able to speak with our interventional team again today and carefully review his angiography and discuss potential treatment strategies.  Unfortunately, PCI is not a feasible option and will be extremely high risk given the calcific nature of his vessels.  Also in discussion, he is not a candidate for CABG.  He would not have any vein conduits given his bilateral below-knee amputations.  Also his ongoing issues with anemia would certainly pose a significant issue. -His EKG today during an episode of diaphoresis did show ST segment elevation in the inferior leads.  Once again, he is not a further invasive  candidate.  Continue with aggressive  medical management. -I will increase his carvedilol to 12.5 mg twice a day -I will give him Lasix 80 mg IV twice daily and stop his torsemide 40 mg a day.  His left ventricular end-diastolic pressure was 30 mmHg during heart catheterization.  His ejection fraction in the 40% range -Continue with IV nitroglycerin drip, continue with IV heparin to complete 48 hours. -I will increase his rosuvastatin from 5 mg up to 20 mg for high intensity statin dose. -We will avoid Plavix given his ongoing battles with anemia.  Further work-up investigation per primary team.  Per patient, has had endoscopies in the past that were unremarkable. -Continue with amlodipine as antianginal. -Spoke with care team.  Discussed with daughter Deborra Medina [phone]. -He lives at [facility] in [place].     For questions or updates, please contact [place] Please consult [email or web address] for contact info under        Signed, Candee Furbish, MD  03/22/2020, 1:12 PM

## 2020-03-22 NOTE — Progress Notes (Addendum)
PROGRESS NOTE    Cory Weber  FTD:[id number] DOB: [birth year] DOA: 03/20/2020 PCP: Iona Beard, MD   Brief Narrative: 70 year old with past medical history significant for CKD, CVA, hepatitis C, bilateral BKA, hyperlipidemia, BPH, GERD, anemia, asthma who presents complaining of acute onset shortness of breath.  Patient is oxygen dependent and normally wears 2 L of oxygen at baseline.  His pulse ox dropped into the 70s and he was placed on nonrebreather when he arrived.  He has not had cough or fever.  He had Covid 2 months ago.  He denies chest pain, fever, diarrhea.  He does report that he has had occasional black stool over the last month.  He reports shortness of breath when lying flat.  He did have chest pain several days prior to admission.  He lives at a skilled nursing facility.  Patient was found to have acute hypoxic respiratory failure secondary to heart failure exacerbation and non-STEMI.  Patient was [facility] from [facility] to [facility] for cardiac catheterization. Cardiac cath performed 2/8 show:  Severe complex CAD. There is a tapering 70% stenosis in the distal Left main involving as well the ostium of the LAD and ramus intermediate with hazy appearance. The LCx is a co-dominant vessel with extensive dissection involving the ostium and proximal vessel and is is subtotally occluded. Moderate LV dysfunction. EF 40-45%. Severely elevated LVEDP 30 mm Hg.    Assessment & Plan:   Principal Problem:   NSTEMI (non-ST elevated myocardial infarction) ([place]) Active Problems:   Polysubstance abuse ([place])   Tobacco abuse   Melena   Chronic kidney disease, stage 3 (HCC)   Essential hypertension   Chronic hepatitis C without hepatic coma (HCC)   History of CVA (cerebrovascular accident) without residual deficits   Phantom limb pain (HCC)   Anemia of chronic disease   PVD (peripheral vascular disease) (HCC)   Amputation of left lower extremity below knee (HCC)   GERD  (gastroesophageal reflux disease)   Edema of right lower extremity   Uncontrolled type II diabetes with peripheral autonomic neuropathy (HCC)   Dyslipidemia associated with type 2 diabetes mellitus (HCC)   Physical deconditioning   Anemia, chronic disease   Chronic diastolic CHF (congestive heart failure) ([place])   PAD (peripheral artery disease) ([place])   DNR (do not resuscitate)   Heart failure ([place])   1-non-STEMI: -Patient was a started on heparin drip and nitroglycerin drip.  Continue -Troponin peaked at 12,625. -Neurology consulted, patient underwent cardiac catheterization 12/8 which showed:Severe complex CAD. There is a tapering 70% stenosis in the distal Left main involving as well the ostium of the LAD and ramus intermediate with hazy appearance. The LCx is a co-dominant vessel with extensive dissection involving the ostium and proximal vessel and is is subtotally occluded. -Plan is for medical management.  No acute option for PCI, per cardiology patient is not a good candidate for CABG.  2-Acute systolic/diastolic heart failure exacerbation with acute hypoxic respiratory failure and pulmonary edema: -Patient presented with oxygen saturation in the 70s, initially placed on nonrebreather, currently on 3 L. -Chest x-ray show pulmonary edema, BNP 213.  Echo ejection fraction 40 to 45%. -Started on IV lasix 2/09  3-Hypertensive urgency: On nitroglycerin drip, continue with amlodipine, Coreg and torsemide. Will add hydralazine  4-Hyperlipidemia: Continue with statin 5-history of CVA: Continue with aspirin statin and Plavix  GERD: Continue with PPI  Hepatitis C: May follow-up as an outpatient  Diabetes type 2 with nephropathy: Continue with Lantus  and sliding scale insulin.  Anemia of chronic disease: Hemoglobin baseline 9 Received 1 unit packed red blood cell. Hb stable at 8.8.  Peripheral vascular disease: Status post left BKA 2017, right BKA 2018  Morbid obesity BMI  42 Need lifestyle modification  Chronic kidney disease a stage IIIa: Continue to monitor      Estimated body mass index is 42.33 kg/m as calculated from the following:   Height as of 10/23/19: 5\' 7"  (1.702 m).   Weight as of this encounter: 122.6 kg.   DVT prophylaxis: Heparin  Code Status: DNR Family Communication: Disposition Plan:  Status is: Inpatient  Remains inpatient appropriate because:Ongoing active pain requiring inpatient pain management   Dispo: The patient is from: Home              Anticipated d/c is to: Home              Anticipated d/c date is: 3 days              Patient currently is not medically stable to d/c.   Difficult to place patient No        Consultants:   Cardiology   Procedures:    CATH; Mid LM to Dist LM lesion is 70% stenosed.  Ost LAD to Prox LAD lesion is 70% stenosed.  Ramus lesion is 70% stenosed.  Ost Cx to Mid Cx lesion is 99% stenosed.  Prox RCA lesion is 40% stenosed.  There is moderate left ventricular systolic dysfunction.  LV end diastolic pressure is severely elevated.  The left ventricular ejection fraction is 35-45% by visual estimate.   1. Severe complex CAD. There is a tapering 70% stenosis in the distal Left main involving as well the ostium of the LAD and ramus intermediate with hazy appearance. The LCx is a co-dominant vessel with extensive dissection involving the ostium and proximal vessel and is is subtotally occluded. 2. Moderate LV dysfunction. EF 40-45% 3. Severely elevated LVEDP 30 mm Hg.   Antimicrobials:    Subjective: He is chest pain free this am, breathing better. He is on nitroglycerin Gtt.   Objective: Vitals:   03/22/20 1036 03/22/20 1100 03/22/20 1141 03/22/20 1239  BP: (!) 144/83 137/89 127/86 (!) 150/93  Pulse:      Resp:  18    Temp: 97.7 F (36.5 C)     TempSrc:      SpO2:      Weight:        Intake/Output Summary (Last 24 hours) at 03/22/2020 1322 Last data filed at  03/22/2020 0956 Gross per 24 hour  Intake 672.17 ml  Output 1350 ml  Net -677.83 ml   Filed Weights   03/20/20 0044 03/21/20 0606 03/22/20 0111  Weight: 125 kg 122.4 kg 122.6 kg    Examination:  General exam: Appears calm and comfortable  Respiratory system: Bilateral crackles.  Cardiovascular system: S1 & S2 heard, RRR., murmurs, rubs, gallops or clicks. No pedal edema. Gastrointestinal system: Abdomen is nondistended, soft and nontender. No organomegaly or masses felt. Normal bowel sounds heard. Central nervous system: Alert and oriented.  Extremities: Symmetric 5 x 5 power.    Data Reviewed: I have personally reviewed following labs and imaging studies  CBC: Recent Labs  Lab 03/20/20 0911 03/20/20 1725 03/21/20 0414 03/21/20 1516 03/22/20 0243  WBC 6.0  --  6.9  --  6.7  HGB 7.6* 7.9* 7.9* 9.1* 8.8*  HCT 25.5* 26.3* 26.1* 29.4* 28.6*  MCV 100.4*  --  97.8  --  94.1  PLT 256  --  226  --  245   Basic Metabolic Panel: Recent Labs  Lab 03/20/20 0324 03/21/20 0414 03/21/20 0901 03/22/20 0243  NA 134* 135  --  136  K 4.2 3.6  --  4.3  CL 98 95*  --  95*  CO2 33* 33*  --  34*  GLUCOSE 308* 140*  --  207*  BUN 14 14  --  14  CREATININE 1.39* 1.33* 1.34* 1.45*  CALCIUM 8.5* 8.6*  --  8.8*  MG  --  2.0  --  2.1   GFR: Estimated Creatinine Clearance: 62 mL/min (A) (by C-G formula based on SCr of 1.45 mg/dL (H)). Liver Function Tests: Recent Labs  Lab 03/20/20 0324  AST 26  ALT 10  ALKPHOS 51  BILITOT 0.3  PROT 8.9*  ALBUMIN 3.2*   No results for input(s): LIPASE, AMYLASE in the last 168 hours. No results for input(s): AMMONIA in the last 168 hours. Coagulation Profile: Recent Labs  Lab 03/20/20 0119  INR 1.0   Cardiac Enzymes: No results for input(s): CKTOTAL, CKMB, CKMBINDEX, TROPONINI in the last 168 hours. BNP (last 3 results) No results for input(s): PROBNP in the last 8760 hours. HbA1C: Recent Labs    03/20/20 1138  HGBA1C 6.6*    CBG: Recent Labs  Lab 03/21/20 0606 03/21/20 1202 03/21/20 2115 03/22/20 0617 03/22/20 1112  GLUCAP 129* 162* 195* 181* 179*   Lipid Profile: Recent Labs    03/21/20 0206  CHOL 148  HDL 39*  LDLCALC 71  TRIG 188*  CHOLHDL 3.8   Thyroid Function Tests: No results for input(s): TSH, T4TOTAL, FREET4, T3FREE, THYROIDAB in the last 72 hours. Anemia Panel: Recent Labs    03/20/20 0325 03/20/20 0911  VITAMINB12 386  --   FOLATE  --  10.5  FERRITIN 19*  --   TIBC 320  --   IRON 60  --    Sepsis Labs: Recent Labs  Lab 03/20/20 0121 03/20/20 0324  LATICACIDVEN 2.1* 1.6    Recent Results (from the past 240 hour(s))  Culture, blood (Routine x 2)     Status: None (Preliminary result)   Collection Time: 03/20/20  1:15 AM   Specimen: BLOOD  Result Value Ref Range Status   Specimen Description BLOOD BLOOD LEFT HAND  Final   Special Requests   Final    BOTTLES DRAWN AEROBIC AND ANAEROBIC Blood Culture adequate volume   Culture   Final    NO GROWTH 2 DAYS Performed at [facility], [address] [place], [place] [address]    Report Status PENDING  Incomplete  Culture, blood (Routine x 2)     Status: None (Preliminary result)   Collection Time: 03/20/20  1:19 AM   Specimen: BLOOD  Result Value Ref Range Status   Specimen Description BLOOD SITE NOT SPECIFIED  Final   Special Requests   Final    BOTTLES DRAWN AEROBIC AND ANAEROBIC Blood Culture adequate volume   Culture   Final    NO GROWTH 2 DAYS Performed at [facility], [address], [place], [place] [address]    Report Status PENDING  Incomplete  SARS Coronavirus 2 by RT PCR (hospital order, performed in [place] hospital lab) Nasopharyngeal Nasopharyngeal Swab     Status: None   Collection Time: 03/20/20  1:20 AM   Specimen: Nasopharyngeal Swab  Result Value Ref Range Status   SARS Coronavirus 2 NEGATIVE NEGATIVE  Final    Comment: (NOTE) SARS-CoV-2 target nucleic acids are NOT DETECTED.  The  SARS-CoV-2 RNA is generally detectable in upper and lower respiratory specimens during the acute phase of infection. The lowest concentration of SARS-CoV-2 viral copies this assay can detect is 250 copies / mL. A negative result does not preclude SARS-CoV-2 infection and should not be used as the sole basis for treatment or other patient management decisions.  A negative result may occur with improper specimen collection / handling, submission of specimen other than nasopharyngeal swab, presence of viral mutation(s) within the areas targeted by this assay, and inadequate number of viral copies (<250 copies / mL). A negative result must be combined with clinical observations, patient history, and epidemiological information.  Fact Sheet for Patients:   StrictlyIdeas.no  Fact Sheet for Healthcare Providers: BankingDealers.co.za  This test is not yet approved or  cleared by the [place] FDA and has been authorized for detection and/or diagnosis of SARS-CoV-2 by FDA under an Emergency Use Authorization (EUA).  This EUA will remain in effect (meaning this test can be used) for the duration of the COVID-19 declaration under Section 564(b)(1) of the Act, 21 U.S.C. section 360bbb-3(b)(1), unless the authorization is terminated or revoked sooner.  Performed at [facility], [address] [place], [place] [address]   MRSA PCR Screening     Status: Abnormal   Collection Time: 03/21/20  6:59 AM   Specimen: Nasal Mucosa; Nasopharyngeal  Result Value Ref Range Status   MRSA by PCR POSITIVE (A) NEGATIVE Final    Comment:        The GeneXpert MRSA Assay (FDA approved for NASAL specimens only), is one component of a comprehensive MRSA colonization surveillance program. It is not intended to diagnose MRSA infection nor to guide or monitor treatment for MRSA infections. RESULT CALLED TO, READ BACK BY AND VERIFIED WITH: RN L.KING AT 9629 ON  03/21/2020 BY T.SAAD Performed at [facility] [address], [place], [place] [address]          Radiology Studies: CARDIAC CATHETERIZATION  Result Date: 03/21/2020  Mid LM to Dist LM lesion is 70% stenosed.  Ost LAD to Prox LAD lesion is 70% stenosed.  Ramus lesion is 70% stenosed.  Ost Cx to Mid Cx lesion is 99% stenosed.  Prox RCA lesion is 40% stenosed.  There is moderate left ventricular systolic dysfunction.  LV end diastolic pressure is severely elevated.  The left ventricular ejection fraction is 35-45% by visual estimate.  1. Severe complex CAD. There is a tapering 70% stenosis in the distal Left main involving as well the ostium of the LAD and ramus intermediate with hazy appearance. The LCx is a co-dominant vessel with extensive dissection involving the ostium and proximal vessel and is is subtotally occluded. 2. Moderate LV dysfunction. EF 40-45% 3. Severely elevated LVEDP 30 mm Hg. Plan: patient is not a candidate for PCI. He is likely not a candidate for CABG as well given multiple co-morbidities. Will discuss with rounding team. Likely manage medically. Prognosis is very poor.        Scheduled Meds: . amLODipine  5 mg Oral Daily  . vitamin C  500 mg Oral BID  . aspirin EC  81 mg Oral Daily  . carvedilol  12.5 mg Oral BID WC  . Chlorhexidine Gluconate Cloth  6 each Topical Q0600  . DULoxetine  60 mg Oral Daily  . ferrous sulfate  325 mg Oral Q breakfast  .  furosemide  80 mg Intravenous BID  . heparin  5,000 Units Subcutaneous Q8H  . insulin aspart  0-15 Units Subcutaneous TID WC  . insulin aspart  0-5 Units Subcutaneous QHS  . insulin aspart  8 Units Subcutaneous TID AC  . insulin glargine  30 Units Subcutaneous QHS  . lidocaine  1 patch Transdermal Daily  . mupirocin ointment  1 application Nasal BID  . pantoprazole  40 mg Oral Daily  . polyethylene glycol  17 g Oral Daily  . pregabalin  75 mg Oral BID  . [START ON 03/23/2020] rosuvastatin  20 mg  Oral Daily  . senna  1 tablet Oral Daily  . sodium chloride flush  3 mL Intravenous Q12H  . sodium chloride flush  3 mL Intravenous Q12H  . tamsulosin  0.4 mg Oral Daily  . cyanocobalamin  500 mcg Oral Daily   Continuous Infusions: . sodium chloride    . nitroGLYCERIN 65 mcg/min (03/22/20 1244)     LOS: 2 days    Time spent: 35 minutes.     Elmarie Shiley, MD [facility]   If 7PM-7AM, please contact night-coverage [email or web address]  03/22/2020, 1:22 PM

## 2020-03-23 DIAGNOSIS — I214 Non-ST elevation (NSTEMI) myocardial infarction: Secondary | ICD-10-CM | POA: Diagnosis not present

## 2020-03-23 DIAGNOSIS — J810 Acute pulmonary edema: Secondary | ICD-10-CM | POA: Diagnosis not present

## 2020-03-23 LAB — GLUCOSE, CAPILLARY
Glucose-Capillary: 227 mg/dL — ABNORMAL HIGH (ref 70–99)
Glucose-Capillary: 179 mg/dL — ABNORMAL HIGH (ref 70–99)
Glucose-Capillary: 230 mg/dL — ABNORMAL HIGH (ref 70–99)
Glucose-Capillary: 182 mg/dL — ABNORMAL HIGH (ref 70–99)
Glucose-Capillary: 317 mg/dL — ABNORMAL HIGH (ref 70–99)

## 2020-03-23 LAB — CBC
HCT: 26.5 % — ABNORMAL LOW (ref 39.0–52.0)
Hemoglobin: 8.1 g/dL — ABNORMAL LOW (ref 13.0–17.0)
MCH: 28.8 pg (ref 26.0–34.0)
MCHC: 30.6 g/dL (ref 30.0–36.0)
MCV: 94.3 fL (ref 80.0–100.0)
Platelets: 238 10*3/uL (ref 150–400)
RBC: 2.81 MIL/uL — ABNORMAL LOW (ref 4.22–5.81)
RDW: 15.1 % (ref 11.5–15.5)
WBC: 5.7 10*3/uL (ref 4.0–10.5)
nRBC: 0 % (ref 0.0–0.2)

## 2020-03-23 LAB — BASIC METABOLIC PANEL
Anion gap: 9 (ref 5–15)
BUN: 18 mg/dL (ref 8–23)
CO2: 34 mmol/L — ABNORMAL HIGH (ref 22–32)
Calcium: 8.9 mg/dL (ref 8.9–10.3)
Chloride: 92 mmol/L — ABNORMAL LOW (ref 98–111)
Creatinine, Ser: 1.77 mg/dL — ABNORMAL HIGH (ref 0.61–1.24)
GFR, Estimated: 42 mL/min — ABNORMAL LOW (ref >60)
Glucose, Bld: 261 mg/dL — ABNORMAL HIGH (ref 70–99)
Potassium: 3.6 mmol/L (ref 3.5–5.1)
Sodium: 135 mmol/L (ref 135–145)

## 2020-03-23 LAB — MAGNESIUM: Magnesium: 2.3 mg/dL (ref 1.7–2.4)

## 2020-03-23 MED ORDER — CARVEDILOL 25 MG PO TABS
25.0000 mg | ORAL_TABLET | Freq: Two times a day (BID) | ORAL | Status: DC
  Administered 2020-03-23 – 2020-04-08 (×30): 25 mg via ORAL
  Filled 2020-03-23 (×30): qty 1

## 2020-03-23 MED ORDER — ISOSORBIDE MONONITRATE ER 60 MG PO TB24
60.0000 mg | ORAL_TABLET | Freq: Every day | ORAL | Status: DC
  Administered 2020-03-23 – 2020-03-24 (×2): 60 mg via ORAL
  Filled 2020-03-23 (×2): qty 1

## 2020-03-23 NOTE — Evaluation (Signed)
Physical Therapy Evaluation Patient Details Name: Cory Weber MRN: [id number] DOB: Jan 12, 1951 Today's Date: 03/23/2020   History of Present Illness  Pt adm with acute hypoxic respiratory failure secondary to heart failure exacerbation and NSTEMI. Cardiac cath 2/8 severe complex CAD. Pt not candidate for CABG or PCI. PMH - bil BKA, ckd, cva, hep C, asthma  Clinical Impression  Pt admitted with above diagnosis and presents to PT with functional limitations due to deficits listed below (See PT problem list). Pt needs skilled PT to maximize independence and safety to allow discharge back to long term care at [facility]. Recommend PT under long term care.      Follow Up Recommendations Other (comment) (Return to long term care with PT under Medicare Part B)    Equipment Recommendations  None recommended by PT    Recommendations for Other Services       Precautions / Restrictions Precautions Precautions: Fall      Mobility  Bed Mobility Overal bed mobility: Needs Assistance Bed Mobility: Supine to Sit;Sit to Supine     Supine to sit: Min assist Sit to supine: Min assist   General bed mobility comments: Assist to elevate trunk into sitting and bring hips to EOB.    Transfers                 General transfer comment: Sitting EOB scooted laterally 2' up side of bed  Ambulation/Gait                Stairs            Wheelchair Mobility    Modified Rankin (Stroke Patients Only)       Balance Overall balance assessment: Needs assistance Sitting-balance support: No upper extremity supported;Feet unsupported Sitting balance-Leahy Scale: Fair                                       Pertinent Vitals/Pain Pain Assessment: No/denies pain    Home Living Family/patient expects to be discharged to:: Skilled nursing facility                 Additional Comments: Pt on 2-3L of O2    Prior Function Level of Independence: Needs  assistance   Gait / Transfers Assistance Needed: Primarily w/c level. Uses sliding board for transfer. Needs assist with transfer at times. Has prosthetics and uses at times with assist.           Hand Dominance   Dominant Hand: Right    Extremity/Trunk Assessment   Upper Extremity Assessment Upper Extremity Assessment: Defer to OT evaluation    Lower Extremity Assessment Lower Extremity Assessment: RLE deficits/detail;LLE deficits/detail;Generalized weakness RLE Deficits / Details: BKA LLE Deficits / Details: BKA       Communication   Communication: No difficulties  Cognition Arousal/Alertness: Awake/alert Behavior During Therapy: WFL for tasks assessed/performed Overall Cognitive Status: Within Functional Limits for tasks assessed                                        General Comments      Exercises     Assessment/Plan    PT Assessment Patient needs continued PT services  PT Problem List Decreased activity tolerance;Decreased balance;Decreased mobility;Decreased strength;Obesity       PT Treatment Interventions DME instruction;Functional mobility training;Therapeutic  activities;Therapeutic exercise;Balance training;Patient/family education;Wheelchair mobility training    PT Goals (Current goals can be found in the Care Plan section)  Acute Rehab PT Goals PT Goal Formulation: With patient Time For Goal Achievement: 04/06/20 Potential to Achieve Goals: Good    Frequency Min 2X/week   Barriers to discharge        Co-evaluation               AM-PAC PT "6 Clicks" Mobility  Outcome Measure Help needed turning from your back to your side while in a flat bed without using bedrails?: A Little Help needed moving from lying on your back to sitting on the side of a flat bed without using bedrails?: A Little Help needed moving to and from a bed to a chair (including a wheelchair)?: A Lot Help needed standing up from a chair using your  arms (e.g., wheelchair or bedside chair)?: Total Help needed to walk in hospital room?: Total Help needed climbing 3-5 steps with a railing? : Total 6 Click Score: 11    End of Session Equipment Utilized During Treatment: Oxygen Activity Tolerance: Patient tolerated treatment well Patient left: in bed;with call bell/phone within reach;with bed alarm set   PT Visit Diagnosis: Other abnormalities of gait and mobility (R26.89);Muscle weakness (generalized) (M62.81)    Time: 1443-6016 PT Time Calculation (min) (ACUTE ONLY): 16 min   Charges:   PT Evaluation $PT Eval Moderate Complexity: [place] Pager [phone] Office [place] 03/23/2020, 2:23 PM

## 2020-03-23 NOTE — Progress Notes (Signed)
PROGRESS NOTE    Cory Weber  GEX:[id number] DOB: 1951/01/02 DOA: 03/20/2020 PCP: Iona Beard, MD   Brief Narrative: 70 year old with past medical history significant for CKD, CVA, hepatitis C, bilateral BKA, hyperlipidemia, BPH, GERD, anemia, asthma who presents complaining of acute onset shortness of breath.  Patient is oxygen dependent and normally wears 2 L of oxygen at baseline.  His pulse ox dropped into the 70s and he was placed on nonrebreather when he arrived.  He has not had cough or fever.  He had Covid 2 months ago.  He denies chest pain, fever, diarrhea.  He does report that he has had occasional black stool over the last month.  He reports shortness of breath when lying flat.  He did have chest pain several days prior to admission.  He lives at a skilled nursing facility.  Patient was found to have acute hypoxic respiratory failure secondary to heart failure exacerbation and non-STEMI.  Patient was transferred from [facility] to [facility] for cardiac catheterization. Cardiac cath performed 2/8 show:  Severe complex CAD. There is a tapering 70% stenosis in the distal Left main involving as well the ostium of the LAD and ramus intermediate with hazy appearance. The LCx is a co-dominant vessel with extensive dissection involving the ostium and proximal vessel and is is subtotally occluded. Moderate LV dysfunction. EF 40-45%. Severely elevated LVEDP 30 mm Hg.    Assessment & Plan:   Principal Problem:   NSTEMI (non-ST elevated myocardial infarction) ([place]) Active Problems:   Polysubstance abuse ([place])   Tobacco abuse   Melena   Chronic kidney disease, stage 3 (HCC)   Essential hypertension   Chronic hepatitis C without hepatic coma (HCC)   History of CVA (cerebrovascular accident) without residual deficits   Phantom limb pain (HCC)   Anemia of chronic disease   PVD (peripheral vascular disease) (HCC)   Amputation of left lower extremity below knee (HCC)   GERD  (gastroesophageal reflux disease)   Edema of right lower extremity   Uncontrolled type II diabetes with peripheral autonomic neuropathy (HCC)   Dyslipidemia associated with type 2 diabetes mellitus (HCC)   Physical deconditioning   Anemia, chronic disease   Chronic diastolic CHF (congestive heart failure) ([place])   PAD (peripheral artery disease) ([place])   DNR (do not resuscitate)   Heart failure ([place])   1-Non-STEMI: -Patient was a started on heparin drip and nitroglycerin drip.  Continue -Troponin peaked at 12,625. -Neurology consulted, patient underwent cardiac catheterization 12/8 which showed:Severe complex CAD. There is a tapering 70% stenosis in the distal Left main involving as well the ostium of the LAD and ramus intermediate with hazy appearance. The LCx is a co-dominant vessel with extensive dissection involving the ostium and proximal vessel and is is subtotally occluded. -Plan is for medical management.  No acute option for PCI, per cardiology patient is not a good candidate for CABG. Medical management.  Still on nitroglycerin gtt.   2-Acute systolic/diastolic heart failure exacerbation with acute hypoxic respiratory failure and pulmonary edema: -Patient presented with oxygen saturation in the 70s, initially placed on nonrebreather, currently on 3 L. -Chest x-ray show pulmonary edema, BNP 213.  Echo ejection fraction 40 to 45%. -Started on IV lasix 2/09, continue with IV lasix. Negative 2.8 L.   3-Hypertensive urgency: On nitroglycerin drip, continue with amlodipine, Coreg and torsemide. Continue with  hydralazine BP better controlled.   4-Hyperlipidemia: Continue with statin 5-history of CVA: Continue with aspirin statin and Plavix  GERD: Continue with PPI  Hepatitis C: May follow-up as an outpatient  Diabetes type 2 with nephropathy: Continue with Lantus and sliding scale insulin.  Anemia of chronic disease: Hemoglobin baseline 9 Received 1 unit packed red blood  cell. Hb stable at 8.8.  Peripheral vascular disease: Status post left BKA 2017, right BKA 2018  Morbid obesity BMI 42 Need lifestyle modification  Chronic kidney disease a stage IIIa: Continue to monitor Cr increase to 1. 7     Estimated body mass index is 43.37 kg/m as calculated from the following:   Height as of 10/23/19: 5\' 7"  (1.702 m).   Weight as of this encounter: 125.6 kg.   DVT prophylaxis: Heparin  Code Status: DNR Family Communication: Disposition Plan:  Status is: Inpatient  Remains inpatient appropriate because:Ongoing active pain requiring inpatient pain management   Dispo: The patient is from: Home              Anticipated d/c is to: Home              Anticipated d/c date is: 3 days              Patient currently is not medically stable to d/c.   Difficult to place patient No        Consultants:   Cardiology   Procedures:    CATH; Mid LM to Dist LM lesion is 70% stenosed.  Ost LAD to Prox LAD lesion is 70% stenosed.  Ramus lesion is 70% stenosed.  Ost Cx to Mid Cx lesion is 99% stenosed.  Prox RCA lesion is 40% stenosed.  There is moderate left ventricular systolic dysfunction.  LV end diastolic pressure is severely elevated.  The left ventricular ejection fraction is 35-45% by visual estimate.   1. Severe complex CAD. There is a tapering 70% stenosis in the distal Left main involving as well the ostium of the LAD and ramus intermediate with hazy appearance. The LCx is a co-dominant vessel with extensive dissection involving the ostium and proximal vessel and is is subtotally occluded. 2. Moderate LV dysfunction. EF 40-45% 3. Severely elevated LVEDP 30 mm Hg.   Antimicrobials:    Subjective: He is feeling better, denies chest pain or dyspnea.   Objective: Vitals:   03/23/20 0746 03/23/20 0956 03/23/20 1136 03/23/20 1428  BP: (!) 147/83 136/76  (!) 160/82  Pulse: (!) 103     Resp: 16     Temp: 98.1 F (36.7 C)  98.6 F  (37 C)   TempSrc: Oral  Oral   SpO2: 97%     Weight:        Intake/Output Summary (Last 24 hours) at 03/23/2020 1444 Last data filed at 03/23/2020 1248 Gross per 24 hour  Intake 240 ml  Output 1550 ml  Net -1310 ml   Filed Weights   03/21/20 0606 03/22/20 0111 03/23/20 0429  Weight: 122.4 kg 122.6 kg 125.6 kg    Examination:  General exam: NAD Respiratory system: BL crakles.  Cardiovascular system: S 1, S 2 RRR Gastrointestinal system: BS present, soft, nt Central nervous system: alert Extremities: BL BKA    Data Reviewed: I have personally reviewed following labs and imaging studies  CBC: Recent Labs  Lab 03/20/20 0911 03/20/20 1725 03/21/20 0414 03/21/20 1516 03/22/20 0243 03/23/20 0234  WBC 6.0  --  6.9  --  6.7 5.7  HGB 7.6* 7.9* 7.9* 9.1* 8.8* 8.1*  HCT 25.5* 26.3* 26.1* 29.4* 28.6* 26.5*  MCV 100.4*  --  97.8  --  94.1 94.3  PLT 256  --  226  --  250 850   Basic Metabolic Panel: Recent Labs  Lab 03/20/20 0324 03/21/20 0414 03/21/20 0901 03/22/20 0243 03/23/20 0234  NA 134* 135  --  136 135  K 4.2 3.6  --  4.3 3.6  CL 98 95*  --  95* 92*  CO2 33* 33*  --  34* 34*  GLUCOSE 308* 140*  --  207* 261*  BUN 14 14  --  14 18  CREATININE 1.39* 1.33* 1.34* 1.45* 1.77*  CALCIUM 8.5* 8.6*  --  8.8* 8.9  MG  --  2.0  --  2.1 2.3   GFR: Estimated Creatinine Clearance: 51.5 mL/min (A) (by C-G formula based on SCr of 1.77 mg/dL (H)). Liver Function Tests: Recent Labs  Lab 03/20/20 0324  AST 26  ALT 10  ALKPHOS 51  BILITOT 0.3  PROT 8.9*  ALBUMIN 3.2*   No results for input(s): LIPASE, AMYLASE in the last 168 hours. No results for input(s): AMMONIA in the last 168 hours. Coagulation Profile: Recent Labs  Lab 03/20/20 0119  INR 1.0   Cardiac Enzymes: No results for input(s): CKTOTAL, CKMB, CKMBINDEX, TROPONINI in the last 168 hours. BNP (last 3 results) No results for input(s): PROBNP in the last 8760 hours. HbA1C: No results for input(s):  HGBA1C in the last 72 hours. CBG: Recent Labs  Lab 03/22/20 1554 03/22/20 2126 03/23/20 0426 03/23/20 0651 03/23/20 1138  GLUCAP 266* 158* 227* 179* 230*   Lipid Profile: Recent Labs    03/21/20 0206  CHOL 148  HDL 39*  LDLCALC 71  TRIG 188*  CHOLHDL 3.8   Thyroid Function Tests: No results for input(s): TSH, T4TOTAL, FREET4, T3FREE, THYROIDAB in the last 72 hours. Anemia Panel: No results for input(s): VITAMINB12, FOLATE, FERRITIN, TIBC, IRON, RETICCTPCT in the last 72 hours. Sepsis Labs: Recent Labs  Lab 03/20/20 0121 03/20/20 0324  LATICACIDVEN 2.1* 1.6    Recent Results (from the past 240 hour(s))  Culture, blood (Routine x 2)     Status: None (Preliminary result)   Collection Time: 03/20/20  1:15 AM   Specimen: BLOOD  Result Value Ref Range Status   Specimen Description BLOOD BLOOD LEFT HAND  Final   Special Requests   Final    BOTTLES DRAWN AEROBIC AND ANAEROBIC Blood Culture adequate volume   Culture   Final    NO GROWTH 3 DAYS Performed at [facility], [address], [place], [place] [address]    Report Status PENDING  Incomplete  Culture, blood (Routine x 2)     Status: None (Preliminary result)   Collection Time: 03/20/20  1:19 AM   Specimen: BLOOD  Result Value Ref Range Status   Specimen Description BLOOD SITE NOT SPECIFIED  Final   Special Requests   Final    BOTTLES DRAWN AEROBIC AND ANAEROBIC Blood Culture adequate volume   Culture   Final    NO GROWTH 3 DAYS Performed at [facility], [address], [place],  [address]    Report Status PENDING  Incomplete  SARS Coronavirus 2 by RT PCR (hospital order, performed in [place] hospital lab) Nasopharyngeal Nasopharyngeal Swab     Status: None   Collection Time: 03/20/20  1:20 AM   Specimen: Nasopharyngeal Swab  Result Value Ref Range Status   SARS Coronavirus 2 NEGATIVE NEGATIVE Final    Comment: (NOTE) SARS-CoV-2 target nucleic acids are NOT DETECTED.  The  SARS-CoV-2 RNA is  generally detectable in upper and lower respiratory specimens during the acute phase of infection. The lowest concentration of SARS-CoV-2 viral copies this assay can detect is 250 copies / mL. A negative result does not preclude SARS-CoV-2 infection and should not be used as the sole basis for treatment or other patient management decisions.  A negative result may occur with improper specimen collection / handling, submission of specimen other than nasopharyngeal swab, presence of viral mutation(s) within the areas targeted by this assay, and inadequate number of viral copies (<250 copies / mL). A negative result must be combined with clinical observations, patient history, and epidemiological information.  Fact Sheet for Patients:   StrictlyIdeas.no  Fact Sheet for Healthcare Providers: BankingDealers.co.za  This test is not yet approved or  cleared by the [place] FDA and has been authorized for detection and/or diagnosis of SARS-CoV-2 by FDA under an Emergency Use Authorization (EUA).  This EUA will remain in effect (meaning this test can be used) for the duration of the COVID-19 declaration under Section 564(b)(1) of the Act, 21 U.S.C. section 360bbb-3(b)(1), unless the authorization is terminated or revoked sooner.  Performed at [facility], [address] [place], [place] [address]   MRSA PCR Screening     Status: Abnormal   Collection Time: 03/21/20  6:59 AM   Specimen: Nasal Mucosa; Nasopharyngeal  Result Value Ref Range Status   MRSA by PCR POSITIVE (A) NEGATIVE Final    Comment:        The GeneXpert MRSA Assay (FDA approved for NASAL specimens only), is one component of a comprehensive MRSA colonization surveillance program. It is not intended to diagnose MRSA infection nor to guide or monitor treatment for MRSA infections. RESULT CALLED TO, READ BACK BY AND VERIFIED WITH: RN L.KING AT 0626 ON 03/21/2020 BY  T.SAAD Performed at [facility] [address] [place], [place] [address]          Radiology Studies: CARDIAC CATHETERIZATION  Result Date: 03/21/2020  Mid LM to Dist LM lesion is 70% stenosed.  Ost LAD to Prox LAD lesion is 70% stenosed.  Ramus lesion is 70% stenosed.  Ost Cx to Mid Cx lesion is 99% stenosed.  Prox RCA lesion is 40% stenosed.  There is moderate left ventricular systolic dysfunction.  LV end diastolic pressure is severely elevated.  The left ventricular ejection fraction is 35-45% by visual estimate.  1. Severe complex CAD. There is a tapering 70% stenosis in the distal Left main involving as well the ostium of the LAD and ramus intermediate with hazy appearance. The LCx is a co-dominant vessel with extensive dissection involving the ostium and proximal vessel and is is subtotally occluded. 2. Moderate LV dysfunction. EF 40-45% 3. Severely elevated LVEDP 30 mm Hg. Plan: patient is not a candidate for PCI. He is likely not a candidate for CABG as well given multiple co-morbidities. Will discuss with rounding team. Likely manage medically. Prognosis is very poor.        Scheduled Meds: . amLODipine  5 mg Oral Daily  . vitamin C  500 mg Oral BID  . aspirin EC  81 mg Oral Daily  . carvedilol  25 mg Oral BID WC  . Chlorhexidine Gluconate Cloth  6 each Topical Q0600  . DULoxetine  60 mg Oral Daily  . ferrous sulfate  325 mg Oral Q breakfast  . furosemide  80 mg Intravenous BID  . heparin  5,000 Units Subcutaneous Q8H  .  hydrALAZINE  25 mg Oral Q8H  . insulin aspart  0-15 Units Subcutaneous TID WC  . insulin aspart  0-5 Units Subcutaneous QHS  . insulin aspart  8 Units Subcutaneous TID AC  . insulin glargine  30 Units Subcutaneous QHS  . isosorbide mononitrate  60 mg Oral Daily  . lidocaine  1 patch Transdermal Daily  . mupirocin ointment  1 application Nasal BID  . pantoprazole  40 mg Oral Daily  . polyethylene glycol  17 g Oral Daily  . pregabalin   75 mg Oral BID  . rosuvastatin  20 mg Oral Daily  . senna  1 tablet Oral Daily  . sodium chloride flush  3 mL Intravenous Q12H  . sodium chloride flush  3 mL Intravenous Q12H  . tamsulosin  0.4 mg Oral Daily  . cyanocobalamin  500 mcg Oral Daily   Continuous Infusions: . sodium chloride       LOS: 3 days    Time spent: 35 minutes.     Elmarie Shiley, MD [facility]   If 7PM-7AM, please contact night-coverage [email or web address]  03/23/2020, 2:44 PM

## 2020-03-23 NOTE — Progress Notes (Addendum)
Progress Note  Patient Name: Cory Weber Date of Encounter: 03/23/2020  [facility] Cardiologist: Previously followed by Dr. Bronson Ing.  Subjective   No acute overnight events. He was sleeping soundly when I walked in the room. He denies any chest pain this morning but states he feels he is breathing irregularly. Currently on 2L of O2 (on 2-3L at home). He also reports that he has not had a bowel movement.  Inpatient Medications    Scheduled Meds: . amLODipine  5 mg Oral Daily  . vitamin C  500 mg Oral BID  . aspirin EC  81 mg Oral Daily  . carvedilol  12.5 mg Oral BID WC  . Chlorhexidine Gluconate Cloth  6 each Topical Q0600  . DULoxetine  60 mg Oral Daily  . ferrous sulfate  325 mg Oral Q breakfast  . furosemide  80 mg Intravenous BID  . heparin  5,000 Units Subcutaneous Q8H  . hydrALAZINE  25 mg Oral Q8H  . insulin aspart  0-15 Units Subcutaneous TID WC  . insulin aspart  0-5 Units Subcutaneous QHS  . insulin aspart  8 Units Subcutaneous TID AC  . insulin glargine  30 Units Subcutaneous QHS  . lidocaine  1 patch Transdermal Daily  . mupirocin ointment  1 application Nasal BID  . pantoprazole  40 mg Oral Daily  . polyethylene glycol  17 g Oral Daily  . pregabalin  75 mg Oral BID  . rosuvastatin  20 mg Oral Daily  . senna  1 tablet Oral Daily  . sodium chloride flush  3 mL Intravenous Q12H  . sodium chloride flush  3 mL Intravenous Q12H  . tamsulosin  0.4 mg Oral Daily  . cyanocobalamin  500 mcg Oral Daily   Continuous Infusions: . sodium chloride    . nitroGLYCERIN 70 mcg/min (03/23/20 0546)   PRN Meds: sodium chloride, acetaminophen, baclofen, ipratropium-albuterol, ondansetron (ZOFRAN) IV, sodium chloride flush   Vital Signs    Vitals:   03/23/20 0429 03/23/20 0738 03/23/20 0746 03/23/20 0956  BP: (!) 148/85  (!) 147/83 136/76  Pulse: 100  (!) 103   Resp: 18  16   Temp: 98.7 F (37.1 C) 98.1 F (36.7 C) 98.1 F (36.7 C)   TempSrc: Oral Oral  Oral   SpO2: 94%  97%   Weight: 125.6 kg       Intake/Output Summary (Last 24 hours) at 03/23/2020 1121 Last data filed at 03/23/2020 1020 Gross per 24 hour  Intake 120 ml  Output 1550 ml  Net -1430 ml   Last 3 Weights 03/23/2020 03/22/2020 03/21/2020  Weight (lbs) 276 lb 14.4 oz 270 lb 4.5 oz 269 lb 14.4 oz  Weight (kg) 125.6 kg 122.6 kg 122.426 kg      Telemetry    Sinus rhythm with occasional PAC/PVC. Rates in the the high 90's to low 100's. - Personally Reviewed  ECG    No new ECG tracing today. - Personally Reviewed  Physical Exam   GEN: Morbidly obese African American male in no acute distress.   Neck: JVD difficult to assess due to body habitus. Cardiac: Mild tachycardia with regularly. No murmurs, rubs, or gallops.  Respiratory: No increased work of breathing. Clear to auscultation bilaterally. No wheezes, rhonchi, or rales appreciated. GI: Soft, obese, and non-tender. MS: No lower extremity edema. S/p bilateral BKA. Skin: Soft and dry. Neuro:  No focal deficits. Psych: Normal affect.  Labs    High Sensitivity Troponin:   Recent Labs  Lab 03/20/20 0133 03/20/20 0324 03/21/20 0206 03/21/20 0414  TROPONINIHS 2,819* 3,337* 9,585* 12,625*      Chemistry Recent Labs  Lab 03/20/20 0324 03/21/20 0414 03/21/20 0901 03/22/20 0243 03/23/20 0234  NA 134* 135  --  136 135  K 4.2 3.6  --  4.3 3.6  CL 98 95*  --  95* 92*  CO2 33* 33*  --  34* 34*  GLUCOSE 308* 140*  --  207* 261*  BUN 14 14  --  14 18  CREATININE 1.39* 1.33* 1.34* 1.45* 1.77*  CALCIUM 8.5* 8.6*  --  8.8* 8.9  PROT 8.9*  --   --   --   --   ALBUMIN 3.2*  --   --   --   --   AST 26  --   --   --   --   ALT 10  --   --   --   --   ALKPHOS 51  --   --   --   --   BILITOT 0.3  --   --   --   --   GFRNONAA 56* 59* 58* 53* 42*  ANIONGAP 3* 7  --  7 9     Hematology Recent Labs  Lab 03/21/20 0414 03/21/20 1516 03/22/20 0243 03/23/20 0234  WBC 6.9  --  6.7 5.7  RBC 2.67*  --  3.04* 2.81*   HGB 7.9* 9.1* 8.8* 8.1*  HCT 26.1* 29.4* 28.6* 26.5*  MCV 97.8  --  94.1 94.3  MCH 29.6  --  28.9 28.8  MCHC 30.3  --  30.8 30.6  RDW 15.1  --  15.8* 15.1  PLT 226  --  250 238    BNP Recent Labs  Lab 03/20/20 0119  BNP 213.0*     DDimer No results for input(s): DDIMER in the last 168 hours.   Radiology    CARDIAC CATHETERIZATION  Result Date: 03/21/2020  Mid LM to Dist LM lesion is 70% stenosed.  Ost LAD to Prox LAD lesion is 70% stenosed.  Ramus lesion is 70% stenosed.  Ost Cx to Mid Cx lesion is 99% stenosed.  Prox RCA lesion is 40% stenosed.  There is moderate left ventricular systolic dysfunction.  LV end diastolic pressure is severely elevated.  The left ventricular ejection fraction is 35-45% by visual estimate.  1. Severe complex CAD. There is a tapering 70% stenosis in the distal Left main involving as well the ostium of the LAD and ramus intermediate with hazy appearance. The LCx is a co-dominant vessel with extensive dissection involving the ostium and proximal vessel and is is subtotally occluded. 2. Moderate LV dysfunction. EF 40-45% 3. Severely elevated LVEDP 30 mm Hg. Plan: patient is not a candidate for PCI. He is likely not a candidate for CABG as well given multiple co-morbidities. Will discuss with rounding team. Likely manage medically. Prognosis is very poor.    Cardiac Studies   Echocardiogram 03/20/2020: Impression: 1. Apical hypokinesis. . Left ventricular ejection fraction, by  estimation, is 40 to 45%. The left ventricle has mildly decreased  function. The left ventricle demonstrates regional wall motion  abnormalities (see scoring diagram/findings for  description). There is mild left ventricular hypertrophy. Left ventricular  diastolic parameters are indeterminate.  2. Right ventricular systolic function is normal. The right ventricular  size is normal.  3. Left atrial size was mildly dilated.  4. The mitral valve is normal in structure. No  evidence of  mitral valve  regurgitation. No evidence of mitral stenosis.  5. The aortic valve has an indeterminant number of cusps. There is mild  calcification of the aortic valve. There is mild thickening of the aortic  valve. Aortic valve regurgitation is not visualized. No aortic stenosis is  present.  6. The inferior vena cava is normal in size with greater than 50%  respiratory variability, suggesting right atrial pressure of 3 mmHg.  _______________  Left Cardiac Catheterization 03/21/2020:  Mid LM to Dist LM lesion is 70% stenosed.  Ost LAD to Prox LAD lesion is 70% stenosed.  Ramus lesion is 70% stenosed.  Ost Cx to Mid Cx lesion is 99% stenosed.  Prox RCA lesion is 40% stenosed.  There is moderate left ventricular systolic dysfunction.  LV end diastolic pressure is severely elevated.  The left ventricular ejection fraction is 35-45% by visual estimate.   1. Severe complex CAD. There is a tapering 70% stenosis in the distal Left main involving as well the ostium of the LAD and ramus intermediate with hazy appearance. The LCx is a co-dominant vessel with extensive dissection involving the ostium and proximal vessel and is is subtotally occluded. 2. Moderate LV dysfunction. EF 40-45% 3. Severely elevated LVEDP 30 mm Hg.  Plan: patient is not a candidate for PCI. He is likely not a candidate for CABG as well given multiple co-morbidities. Will discuss with rounding team. Likely manage medically. Prognosis is very poor.   Patient Profile     70 y.o. 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 He rule in for MI and was  ultimately transferred to [facility] for further work-up.  Assessment & Plan    NSTEMI - High-sensitivity troponin peaked at 12,625. - Echo showed LVEF of 40-45% with apical hypokinesis. - LHC on 2/8 showed severe complex CAD including 70% stenosis of mid to distal left main, 70% stenosis of ostial to proximal LAD, 99% stenosis of ostial to mid CX, 70% stenosis of Ramus lesion, and 40% stenosis of proximal RCA. LVEDP severely elevated at 37mmHg.  - Unfortunately patient is not a candidate for PCI or CABG. Therefore, we are treating medically. We completed 48 hours of IV Heparin. Not currently on aspirin or Plavix due to ongoing battles with anemia. Patient is till tachycardic - will increase Coreg to 25mg  twice daily. Crestor has been increased to 20mg  daily. Continue Amlodipine 5mg  daily for antianginal benefits. Patient is still on IV Nitro. Would like to start weaning this. Can increase Amlodipine as we wean to help with BP.  Acute on Chronic Combined CHF Ischemic Cardiomyopathy  - BNP 213. - Chest x-ray showed mildly increased interstitial markings throughout both lungs. - Echo showed LVEF of 40-45% with apical hypokinesis.  - LVEDP severely elevated on cath at 47mmHg. - Currently on IV Lasix 80mg  twice daily. Documented urinary output of 1.25 L yesterday and net negative 2.9 L this admission. Do not think weights are accurate as weight is up 6 lbs from yesterday. Creatinine rising and up to 1.77 today (1.45 yesterday).  - He does not appear significantly volume overloaded on exam. - He has already received morning Lasix. Will discuss continuation of this  with MD. - Continue Coreg as above. - No ACE/ARB given renal function. - Continue to monitor daily weights, strict I/O's, and renal function.   Hypertension - Presented in hypertensive urgency. BP much improved now. However, still on Nitro drip. - Currently on Amlodipine 5mg  daily, Coreg 12.5mg  twice daily, and Hydralazine 25mg  three  times daily as well as Nitro drip. - Will increase Coreg to 25mg  twice daily for additional BP control. - Would like to try to wean off Nitro if able. Will discuss with MD. We may be able to increase Amlodipine and Coreg and then try to wean drip.  Hyperlipidemia - LDL 71. - Crestor increased to 20mg  daily.  Acute on CKD Stage III - Creatinine 1.7 today, up from 1.45 yesterday. Baseline around 1.3.  - Continue to monitor closely.  Otherwise, per primary team: - PAD - Diabetes - Acute anemia: s/p 2 units or PRBCs. Hemoglobin 8.1 today. - Prior CVA  For questions or updates, please contact [facility] Please consult [email or web address] for contact info under        Signed, Darreld Mclean, PA-C  03/23/2020, 11:21 AM    Personally seen and examined. Agree with above.   Agree with weaning IV nitroglycerin.  I will place him on isosorbide 60 mg. Agree with increasing carvedilol to 25 mg twice a day.  Still tachycardic. IV Lasix 80 mg twice a day continue for today, likely switch to p.o. tomorrow.  Creatinine has increased 1.77 up from 1.45 yesterday.  Candee Furbish, MD

## 2020-03-23 NOTE — Progress Notes (Signed)
0756 Received order for NSTEMI. Pt is not candidate for CABG or PCI. Has bilateral amputations and is to return to SNF. Pt not appropriate for CRP 2 referral and needs can better be met by PT. Will not follow pt at this time. Graylon Good RN BSN 03/23/2020 7:58 AM

## 2020-03-24 DIAGNOSIS — S88112A Complete traumatic amputation at level between knee and ankle, left lower leg, initial encounter: Secondary | ICD-10-CM

## 2020-03-24 DIAGNOSIS — I214 Non-ST elevation (NSTEMI) myocardial infarction: Secondary | ICD-10-CM | POA: Diagnosis not present

## 2020-03-24 LAB — BASIC METABOLIC PANEL
Anion gap: 7 (ref 5–15)
BUN: 22 mg/dL (ref 8–23)
CO2: 34 mmol/L — ABNORMAL HIGH (ref 22–32)
Calcium: 8.7 mg/dL — ABNORMAL LOW (ref 8.9–10.3)
Chloride: 92 mmol/L — ABNORMAL LOW (ref 98–111)
Creatinine, Ser: 1.91 mg/dL — ABNORMAL HIGH (ref 0.61–1.24)
GFR, Estimated: 38 mL/min — ABNORMAL LOW (ref >60)
Glucose, Bld: 292 mg/dL — ABNORMAL HIGH (ref 70–99)
Potassium: 3.5 mmol/L (ref 3.5–5.1)
Sodium: 133 mmol/L — ABNORMAL LOW (ref 135–145)

## 2020-03-24 LAB — GLUCOSE, CAPILLARY
Glucose-Capillary: 186 mg/dL — ABNORMAL HIGH (ref 70–99)
Glucose-Capillary: 165 mg/dL — ABNORMAL HIGH (ref 70–99)
Glucose-Capillary: 224 mg/dL — ABNORMAL HIGH (ref 70–99)
Glucose-Capillary: 135 mg/dL — ABNORMAL HIGH (ref 70–99)
Glucose-Capillary: 192 mg/dL — ABNORMAL HIGH (ref 70–99)

## 2020-03-24 LAB — CBC
HCT: 27.4 % — ABNORMAL LOW (ref 39.0–52.0)
Hemoglobin: 8.3 g/dL — ABNORMAL LOW (ref 13.0–17.0)
MCH: 28.9 pg (ref 26.0–34.0)
MCHC: 30.3 g/dL (ref 30.0–36.0)
MCV: 95.5 fL (ref 80.0–100.0)
Platelets: 240 10*3/uL (ref 150–400)
RBC: 2.87 MIL/uL — ABNORMAL LOW (ref 4.22–5.81)
RDW: 15 % (ref 11.5–15.5)
WBC: 5.6 10*3/uL (ref 4.0–10.5)
nRBC: 0 % (ref 0.0–0.2)

## 2020-03-24 LAB — MAGNESIUM: Magnesium: 2.3 mg/dL (ref 1.7–2.4)

## 2020-03-24 MED ORDER — ISOSORBIDE MONONITRATE ER 60 MG PO TB24
90.0000 mg | ORAL_TABLET | Freq: Every day | ORAL | Status: DC
  Administered 2020-03-25: 90 mg via ORAL
  Filled 2020-03-24: qty 1

## 2020-03-24 MED ORDER — AMLODIPINE BESYLATE 10 MG PO TABS
10.0000 mg | ORAL_TABLET | Freq: Every day | ORAL | Status: DC
  Administered 2020-03-25 – 2020-04-08 (×13): 10 mg via ORAL
  Filled 2020-03-24 (×15): qty 1

## 2020-03-24 NOTE — Progress Notes (Signed)
PROGRESS NOTE    Cory Weber  IHK:[id number] DOB: 1950/05/10 DOA: 03/20/2020 PCP: Iona Beard, MD   Brief Narrative: 70 year old with past medical history significant for CKD, CVA, hepatitis C, bilateral BKA, hyperlipidemia, BPH, GERD, anemia, asthma who presents complaining of acute onset shortness of breath.  Patient is oxygen dependent and normally wears 2 L of oxygen at baseline.  His pulse ox dropped into the 70s and he was placed on nonrebreather when he arrived.  He has not had cough or fever.  He had Covid 2 months ago.  He denies chest pain, fever, diarrhea.  He does report that he has had occasional black stool over the last month.  He reports shortness of breath when lying flat.  He did have chest pain several days prior to admission.  He lives at a skilled nursing facility.  Patient was found to have acute hypoxic respiratory failure secondary to heart failure exacerbation and non-STEMI.  Patient was transferred from [facility] to [facility] for cardiac catheterization. Cardiac cath performed 2/8 show:  Severe complex CAD. There is a tapering 70% stenosis in the distal Left main involving as well the ostium of the LAD and ramus intermediate with hazy appearance. The LCx is a co-dominant vessel with extensive dissection involving the ostium and proximal vessel and is is subtotally occluded. Moderate LV dysfunction. EF 40-45%. Severely elevated LVEDP 30 mm Hg.    Assessment & Plan:   Principal Problem:   NSTEMI (non-ST elevated myocardial infarction) ([place]) Active Problems:   Polysubstance abuse ([place])   Tobacco abuse   Melena   Chronic kidney disease, stage 3 (HCC)   Essential hypertension   Chronic hepatitis C without hepatic coma (HCC)   History of CVA (cerebrovascular accident) without residual deficits   Phantom limb pain (HCC)   Anemia of chronic disease   PVD (peripheral vascular disease) (HCC)   Amputation of left lower extremity below knee (HCC)   GERD  (gastroesophageal reflux disease)   Edema of right lower extremity   Uncontrolled type II diabetes with peripheral autonomic neuropathy (HCC)   Dyslipidemia associated with type 2 diabetes mellitus (HCC)   Physical deconditioning   Anemia, chronic disease   Chronic diastolic CHF (congestive heart failure) ([place])   PAD (peripheral artery disease) ([place])   DNR (do not resuscitate)   Heart failure ([place])   1-Non-STEMI: -Patient was treated with heparin drip and nitroglycerin drip.  Transition to Imdur.  -Troponin peaked at 12,625. -Cardiology consulted, patient underwent cardiac catheterization 12/8 which showed: Severe complex CAD. There is a tapering 70% stenosis in the distal Left main involving as well the ostium of the LAD and ramus intermediate with hazy appearance. The LCx is a co-dominant vessel with extensive dissection involving the ostium and proximal vessel and is is subtotally occluded. -Plan is for medical management.  No acute option for PCI, per cardiology patient is not a good candidate for CABG. -Medical management.  -had chest pain last night. Plan is to increase Norvasc and Imdur.   2-Acute systolic/diastolic heart failure exacerbation with acute hypoxic respiratory failure and pulmonary edema: -Patient presented with oxygen saturation in the 70s, initially placed on nonrebreather, currently on 3 L. -Chest x-ray show pulmonary edema, BNP 213.  Echo ejection fraction 40 to 45%. -Started on IV lasix 2/09. -Continue with lasix, monitor cr closely. Negative 2.9L  3-Hypertensive urgency: On nitroglycerin drip, continue with amlodipine, Coreg and torsemide. Continue with  hydralazine BP better controlled.  Plan to increase Norvasc.  4-Hyperlipidemia: Continue with statin 5-history of CVA: Continue with aspirin statin and Plavix  GERD: Continue with PPI  Hepatitis C: May follow-up as an outpatient  Diabetes type 2 with nephropathy: Continue with Lantus and sliding  scale insulin.  Anemia of chronic disease: Hemoglobin baseline 9 Received 1 unit packed red blood cell. Hb stable at 8.8.  Peripheral vascular disease: Status post left BKA 2017, right BKA 2018  Morbid obesity BMI 42 Need lifestyle modification  Chronic kidney disease a stage IIIa: Continue to monitor Cr increase to 1. 7---1.9 Monitor closely on lasix.      Estimated body mass index is 42.33 kg/m as calculated from the following:   Height as of 10/23/19: 5\' 7"  (1.702 m).   Weight as of this encounter: 122.6 kg.   DVT prophylaxis: Heparin  Code Status: DNR Family Communication: Daughter over phone 2/11 Disposition Plan:  Status is: Inpatient  Remains inpatient appropriate because:Ongoing active pain requiring inpatient pain management   Dispo: The patient is from: Home              Anticipated d/c is to: Home              Anticipated d/c date is: 3 days              Patient currently is not medically stable to d/c.   Difficult to place patient No        Consultants:   Cardiology   Procedures:    CATH; Mid LM to Dist LM lesion is 70% stenosed.  Ost LAD to Prox LAD lesion is 70% stenosed.  Ramus lesion is 70% stenosed.  Ost Cx to Mid Cx lesion is 99% stenosed.  Prox RCA lesion is 40% stenosed.  There is moderate left ventricular systolic dysfunction.  LV end diastolic pressure is severely elevated.  The left ventricular ejection fraction is 35-45% by visual estimate.   1. Severe complex CAD. There is a tapering 70% stenosis in the distal Left main involving as well the ostium of the LAD and ramus intermediate with hazy appearance. The LCx is a co-dominant vessel with extensive dissection involving the ostium and proximal vessel and is is subtotally occluded. 2. Moderate LV dysfunction. EF 40-45% 3. Severely elevated LVEDP 30 mm Hg.   Antimicrobials:    Subjective: He is feeling well, had chest pain last night.  Report mild dyspnea.   Objective: Vitals:   03/24/20 0425 03/24/20 0719 03/24/20 1108 03/24/20 1345  BP: (!) 180/98   129/90  Pulse: (!) 49     Resp: 15     Temp: 98.6 F (37 C) 98.7 F (37.1 C) 98.4 F (36.9 C)   TempSrc: Oral Oral Oral   SpO2: 90%     Weight: 122.6 kg       Intake/Output Summary (Last 24 hours) at 03/24/2020 1546 Last data filed at 03/24/2020 1301 Gross per 24 hour  Intake 1194 ml  Output 1550 ml  Net -356 ml   Filed Weights   03/22/20 0111 03/23/20 0429 03/24/20 0425  Weight: 122.6 kg 125.6 kg 122.6 kg    Examination:  General exam: NAD Respiratory system: B/L crackles.  Cardiovascular system: S 1, S 2 RRR Gastrointestinal system: BS present, soft, nt Central nervous system: Alert Extremities: BL BKA    Data Reviewed: I have personally reviewed following labs and imaging studies  CBC: Recent Labs  Lab 03/20/20 0911 03/20/20 1725 03/21/20 0414 03/21/20 1516 03/22/20 0243 03/23/20 0234 03/24/20 0123  WBC  6.0  --  6.9  --  6.7 5.7 5.6  HGB 7.6*   < > 7.9* 9.1* 8.8* 8.1* 8.3*  HCT 25.5*   < > 26.1* 29.4* 28.6* 26.5* 27.4*  MCV 100.4*  --  97.8  --  94.1 94.3 95.5  PLT 256  --  226  --  250 238 240   < > = values in this interval not displayed.   Basic Metabolic Panel: Recent Labs  Lab 03/20/20 0324 03/21/20 0414 03/21/20 0901 03/22/20 0243 03/23/20 0234 03/24/20 0123  NA 134* 135  --  136 135 133*  K 4.2 3.6  --  4.3 3.6 3.5  CL 98 95*  --  95* 92* 92*  CO2 33* 33*  --  34* 34* 34*  GLUCOSE 308* 140*  --  207* 261* 292*  BUN 14 14  --  14 18 22   CREATININE 1.39* 1.33* 1.34* 1.45* 1.77* 1.91*  CALCIUM 8.5* 8.6*  --  8.8* 8.9 8.7*  MG  --  2.0  --  2.1 2.3 2.3   GFR: Estimated Creatinine Clearance: 47.1 mL/min (A) (by C-G formula based on SCr of 1.91 mg/dL (H)). Liver Function Tests: Recent Labs  Lab 03/20/20 0324  AST 26  ALT 10  ALKPHOS 51  BILITOT 0.3  PROT 8.9*  ALBUMIN 3.2*   No results for input(s): LIPASE, AMYLASE in the last 168  hours. No results for input(s): AMMONIA in the last 168 hours. Coagulation Profile: Recent Labs  Lab 03/20/20 0119  INR 1.0   Cardiac Enzymes: No results for input(s): CKTOTAL, CKMB, CKMBINDEX, TROPONINI in the last 168 hours. BNP (last 3 results) No results for input(s): PROBNP in the last 8760 hours. HbA1C: No results for input(s): HGBA1C in the last 72 hours. CBG: Recent Labs  Lab 03/23/20 1552 03/23/20 2155 03/24/20 0323 03/24/20 0614 03/24/20 1110  GLUCAP 182* 317* 186* 165* 224*   Lipid Profile: No results for input(s): CHOL, HDL, LDLCALC, TRIG, CHOLHDL, LDLDIRECT in the last 72 hours. Thyroid Function Tests: No results for input(s): TSH, T4TOTAL, FREET4, T3FREE, THYROIDAB in the last 72 hours. Anemia Panel: No results for input(s): VITAMINB12, FOLATE, FERRITIN, TIBC, IRON, RETICCTPCT in the last 72 hours. Sepsis Labs: Recent Labs  Lab 03/20/20 0121 03/20/20 0324  LATICACIDVEN 2.1* 1.6    Recent Results (from the past 240 hour(s))  Culture, blood (Routine x 2)     Status: None (Preliminary result)   Collection Time: 03/20/20  1:15 AM   Specimen: BLOOD  Result Value Ref Range Status   Specimen Description BLOOD BLOOD LEFT HAND  Final   Special Requests   Final    BOTTLES DRAWN AEROBIC AND ANAEROBIC Blood Culture adequate volume   Culture   Final    NO GROWTH 4 DAYS Performed at [facility], [address] [place], [place] [address]    Report Status PENDING  Incomplete  Culture, blood (Routine x 2)     Status: None (Preliminary result)   Collection Time: 03/20/20  1:19 AM   Specimen: BLOOD  Result Value Ref Range Status   Specimen Description BLOOD SITE NOT SPECIFIED  Final   Special Requests   Final    BOTTLES DRAWN AEROBIC AND ANAEROBIC Blood Culture adequate volume   Culture   Final    NO GROWTH 4 DAYS Performed at [facility], [address] [place], [place] [address]    Report Status PENDING  Incomplete  SARS Coronavirus 2 by RT PCR (hospital  order, performed in [facility] hospital lab) Nasopharyngeal Nasopharyngeal Swab     Status: None   Collection Time: 03/20/20  1:20 AM   Specimen: Nasopharyngeal Swab  Result Value Ref Range Status   SARS Coronavirus 2 NEGATIVE NEGATIVE Final    Comment: (NOTE) SARS-CoV-2 target nucleic acids are NOT DETECTED.  The SARS-CoV-2 RNA is generally detectable in upper and lower respiratory specimens during the acute phase of infection. The lowest concentration of SARS-CoV-2 viral copies this assay can detect is 250 copies / mL. A negative result does not preclude SARS-CoV-2 infection and should not be used as the sole basis for treatment or other patient management decisions.  A negative result may occur with improper specimen collection / handling, submission of specimen other than nasopharyngeal swab, presence of viral mutation(s) within the areas targeted by this assay, and inadequate number of viral copies (<250 copies / mL). A negative result must be combined with clinical observations, patient history, and epidemiological information.  Fact Sheet for Patients:   StrictlyIdeas.no  Fact Sheet for Healthcare Providers: BankingDealers.co.za  This test is not yet approved or  cleared by the [place] FDA and has been authorized for detection and/or diagnosis of SARS-CoV-2 by FDA under an Emergency Use Authorization (EUA).  This EUA will remain in effect (meaning this test can be used) for the duration of the COVID-19 declaration under Section 564(b)(1) of the Act, 21 U.S.C. section 360bbb-3(b)(1), unless the authorization is terminated or revoked sooner.  Performed at [facility], [address], [place], [place] [address]   MRSA PCR Screening     Status: Abnormal   Collection Time: 03/21/20  6:59 AM   Specimen: Nasal Mucosa; Nasopharyngeal  Result Value Ref Range Status   MRSA by PCR POSITIVE (A) NEGATIVE Final    Comment:         The GeneXpert MRSA Assay (FDA approved for NASAL specimens only), is one component of a comprehensive MRSA colonization surveillance program. It is not intended to diagnose MRSA infection nor to guide or monitor treatment for MRSA infections. RESULT CALLED TO, READ BACK BY AND VERIFIED WITH: RN L.KING AT 4315 ON 03/21/2020 BY T.SAAD Performed at [facility] [address] [place], [place] [address]          Radiology Studies: No results found.      Scheduled Meds: . [START ON 03/25/2020] amLODipine  10 mg Oral Daily  . vitamin C  500 mg Oral BID  . aspirin EC  81 mg Oral Daily  . carvedilol  25 mg Oral BID WC  . Chlorhexidine Gluconate Cloth  6 each Topical Q0600  . DULoxetine  60 mg Oral Daily  . ferrous sulfate  325 mg Oral Q breakfast  . furosemide  80 mg Intravenous BID  . heparin  5,000 Units Subcutaneous Q8H  . hydrALAZINE  25 mg Oral Q8H  . insulin aspart  0-15 Units Subcutaneous TID WC  . insulin aspart  0-5 Units Subcutaneous QHS  . insulin aspart  8 Units Subcutaneous TID AC  . insulin glargine  30 Units Subcutaneous QHS  . [START ON 03/25/2020] isosorbide mononitrate  90 mg Oral Daily  . lidocaine  1 patch Transdermal Daily  . mupirocin ointment  1 application Nasal BID  . pantoprazole  40 mg Oral Daily  . polyethylene glycol  17 g Oral Daily  . pregabalin  75 mg Oral BID  . rosuvastatin  20 mg Oral Daily  . senna  1  tablet Oral Daily  . sodium chloride flush  3 mL Intravenous Q12H  . sodium chloride flush  3 mL Intravenous Q12H  . tamsulosin  0.4 mg Oral Daily  . cyanocobalamin  500 mcg Oral Daily   Continuous Infusions: . sodium chloride       LOS: 4 days    Time spent: 35 minutes.     Elmarie Shiley, MD [facility]   If 7PM-7AM, please contact night-coverage [email or web address]  03/24/2020, 3:46 PM

## 2020-03-24 NOTE — Progress Notes (Signed)
Inpatient Diabetes Program Recommendations  AACE/ADA: New Consensus Statement on Inpatient Glycemic Control (2015)  Target Ranges:  Prepandial:   less than 140 mg/dL      Peak postprandial:   less than 180 mg/dL (1-2 hours)      Critically ill patients:  140 - 180 mg/dL   Lab Results  Component Value Date   GLUCAP 165 (H) 03/24/2020   HGBA1C 6.6 (H) 03/20/2020    Review of Glycemic Control Results for Cory Weber, Cory Weber (MRN [id number]) as of 03/24/2020 08:10  Ref. Range 03/23/2020 21:55 03/24/2020 03:23 03/24/2020 06:14  Glucose-Capillary Latest Ref Range: 70 - 99 mg/dL 317 (H) 186 (H) 165 (H)   Diabetes history: Type 2 DM Outpatient Diabetes medications: Lantus 30 units QHS, Novolog 8 units TID, Metformin 850 mg BID Current orders for Inpatient glycemic control: Novolog 0-15 untis TID, Novolog 8 units TID, Lantus 30 units QHS  Inpatient Diabetes Program Recommendations:    Consider increasing Novolog 10 units TID (assuming patient is consuming >50% of meals).   Thanks, Bronson Curb, MSN, RNC-OB Diabetes Coordinator [phone] (8a-5p)

## 2020-03-24 NOTE — Evaluation (Signed)
Occupational Therapy Evaluation Patient Details Name: Cory Weber MRN: [id number] DOB: 06-23-1950 Today's Date: 03/24/2020    History of Present Illness Pt adm with acute hypoxic respiratory failure secondary to heart failure exacerbation and NSTEMI. Cardiac cath 2/8 severe complex CAD. Pt not candidate for CABG or PCI. PMH - bil BKA, ckd, cva, hep C, asthma   Clinical Impression   Pt admitted with the above diagnoses and presents with below problem list. Pt will benefit from continued acute OT to address the below listed deficits and maximize independence with basic ADLs prior to d/c to venue below. PTA pt was needing some assist with bathing/dressing and sliding board transfers to w/c. Pt currently mod A with LB ADLs in sitting/lateral lean position. Able to sit EOB several minutes at supervision level. Intermittent r/o chest soreness.       Follow Up Recommendations  Other (comment);Home health OT (return to SNF)    Equipment Recommendations  None recommended by OT    Recommendations for Other Services       Precautions / Restrictions Precautions Precautions: Fall Restrictions Weight Bearing Restrictions: No      Mobility Bed Mobility Overal bed mobility: Needs Assistance Bed Mobility: Supine to Sit;Sit to Supine     Supine to sit: Min assist Sit to supine: Min assist   General bed mobility comments: Assist to elevate trunk into sitting and bring hips to EOB.    Transfers                 General transfer comment: able to scoot self along EOB to reposition    Balance Overall balance assessment: Needs assistance Sitting-balance support: No upper extremity supported;Feet unsupported Sitting balance-Leahy Scale: Fair                                     ADL either performed or assessed with clinical judgement   ADL Overall ADL's : Needs assistance/impaired Eating/Feeding: Set up;Sitting   Grooming: Set up;Sitting   Upper Body  Bathing: Minimal assistance;Sitting   Lower Body Bathing: Moderate assistance;Sitting/lateral leans   Upper Body Dressing : Set up;Sitting   Lower Body Dressing: Moderate assistance;Sitting/lateral leans                 General ADL Comments: Pt completed bed mobility with min A +1 to fully powerup trunk. Sat EOB several minutes at supervision level. Able to scoot self up in bed in supine using BUE on head rail     Vision         Perception     Praxis      Pertinent Vitals/Pain Pain Assessment: Faces Faces Pain Scale: Hurts little more Pain Location: chest Pain Descriptors / Indicators: Sore Pain Intervention(s): Limited activity within patient's tolerance;Monitored during session;Repositioned     Hand Dominance Right   Extremity/Trunk Assessment Upper Extremity Assessment Upper Extremity Assessment: Overall WFL for tasks assessed;Generalized weakness   Lower Extremity Assessment Lower Extremity Assessment: Defer to PT evaluation       Communication Communication Communication: No difficulties   Cognition Arousal/Alertness: Awake/alert Behavior During Therapy: WFL for tasks assessed/performed Overall Cognitive Status: Within Functional Limits for tasks assessed                                     General Comments  Exercises     Shoulder Instructions      Home Living Family/patient expects to be discharged to:: Skilled nursing facility                                 Additional Comments: Pt on 2-3L of O2      Prior Functioning/Environment Level of Independence: Needs assistance  Gait / Transfers Assistance Needed: Primarily w/c level. Uses sliding board for transfer. Needs assist with transfer at times. Has prosthetics and uses at times with assist. ADL's / Homemaking Assistance Needed: some assist with bathing "back and bottom" and dressing.            OT Problem List: Decreased strength;Decreased activity  tolerance;Impaired balance (sitting and/or standing);Decreased knowledge of use of DME or AE;Decreased knowledge of precautions;Cardiopulmonary status limiting activity;Pain      OT Treatment/Interventions: Self-care/ADL training;Therapeutic exercise;Energy conservation;DME and/or AE instruction;Therapeutic activities;Patient/family education;Balance training    OT Goals(Current goals can be found in the care plan section) Acute Rehab OT Goals Patient Stated Goal: feel better OT Goal Formulation: With patient Time For Goal Achievement: 04/07/20 Potential to Achieve Goals: Good ADL Goals Pt Will Perform Lower Body Bathing: with min assist;sitting/lateral leans Pt Will Perform Lower Body Dressing: with min assist;sitting/lateral leans Pt Will Transfer to Toilet: with min assist;with transfer board Pt Will Perform Toileting - Clothing Manipulation and hygiene: with min assist;sitting/lateral leans Additional ADL Goal #1: Pt will complete bed mobility at min guard level to prepare for EOB/OOB ADLs.  OT Frequency: Min 2X/week   Barriers to D/C:            Co-evaluation              AM-PAC OT "6 Clicks" Daily Activity     Outcome Measure Help from another person eating meals?: None Help from another person taking care of personal grooming?: None Help from another person toileting, which includes using toliet, bedpan, or urinal?: A Little Help from another person bathing (including washing, rinsing, drying)?: A Lot Help from another person to put on and taking off regular upper body clothing?: A Little Help from another person to put on and taking off regular lower body clothing?: A Lot 6 Click Score: 18   End of Session Equipment Utilized During Treatment: Oxygen  Activity Tolerance: Patient tolerated treatment well;Patient limited by fatigue Patient left: in bed;with call bell/phone within reach  OT Visit Diagnosis: Other abnormalities of gait and mobility (R26.89);Muscle  weakness (generalized) (M62.81);Pain                Time: 1030-1052 OT Time Calculation (min): 22 min Charges:  OT General Charges $OT Visit: 1 Visit OT Evaluation $OT Eval Low Complexity: [place], OT Acute Rehabilitation Services Pager: [phone] Office: [phone]   Hortencia Pilar 03/24/2020, 1:19 PM

## 2020-03-24 NOTE — Progress Notes (Addendum)
Progress Note  Patient Name: Cory Weber Date of Encounter: 03/24/2020  [facility] Cardiologist: Previously followed by Dr. Bronson Ing.  Subjective   No acute overnight events. Patient was weaned off Nitro drip yesterday. He did some recurrent chest discomfort last night that he describes as squeezing near neck. This is similar to pain on presentation. Breathing is at baseline. He does feel like his abdomen may be more distended than usual.  Inpatient Medications    Scheduled Meds: . amLODipine  5 mg Oral Daily  . vitamin C  500 mg Oral BID  . aspirin EC  81 mg Oral Daily  . carvedilol  25 mg Oral BID WC  . Chlorhexidine Gluconate Cloth  6 each Topical Q0600  . DULoxetine  60 mg Oral Daily  . ferrous sulfate  325 mg Oral Q breakfast  . furosemide  80 mg Intravenous BID  . heparin  5,000 Units Subcutaneous Q8H  . hydrALAZINE  25 mg Oral Q8H  . insulin aspart  0-15 Units Subcutaneous TID WC  . insulin aspart  0-5 Units Subcutaneous QHS  . insulin aspart  8 Units Subcutaneous TID AC  . insulin glargine  30 Units Subcutaneous QHS  . isosorbide mononitrate  60 mg Oral Daily  . lidocaine  1 patch Transdermal Daily  . mupirocin ointment  1 application Nasal BID  . pantoprazole  40 mg Oral Daily  . polyethylene glycol  17 g Oral Daily  . pregabalin  75 mg Oral BID  . rosuvastatin  20 mg Oral Daily  . senna  1 tablet Oral Daily  . sodium chloride flush  3 mL Intravenous Q12H  . sodium chloride flush  3 mL Intravenous Q12H  . tamsulosin  0.4 mg Oral Daily  . cyanocobalamin  500 mcg Oral Daily   Continuous Infusions: . sodium chloride     PRN Meds: sodium chloride, acetaminophen, baclofen, ipratropium-albuterol, ondansetron (ZOFRAN) IV, sodium chloride flush   Vital Signs    Vitals:   03/24/20 0300 03/24/20 0400 03/24/20 0425 03/24/20 0719  BP: (!) 153/82 139/89 (!) 180/98   Pulse: (!) 48 (!) 49 (!) 49   Resp: 15 18 15    Temp:   98.6 F (37 C) 98.7 F (37.1 C)   TempSrc:   Oral Oral  SpO2:   90%   Weight:   122.6 kg     Intake/Output Summary (Last 24 hours) at 03/24/2020 0813 Last data filed at 03/24/2020 0527 Gross per 24 hour  Intake 592 ml  Output 1850 ml  Net -1258 ml   Last 3 Weights 03/24/2020 03/23/2020 03/22/2020  Weight (lbs) 270 lb 4.5 oz 276 lb 14.4 oz 270 lb 4.5 oz  Weight (kg) 122.6 kg 125.6 kg 122.6 kg      Telemetry    Normal sinus rhythm with rates in the 90's (sometimes in the low 100's). - Personally Reviewed  ECG    No new ECG tracing today. - Personally Reviewed  Physical Exam   GEN: Morbidly obese African American male in no acute distress.   Neck: JVD difficult to assess due to body habitus. Cardiac: RRR. No murmurs, rubs, or gallops.  Respiratory: No increased work of breathing. Scattered wheezes appreciated but no significant crackles. GI: Soft, obese/mildly distended, and non-tender. MS: No lower extremity edema. S/p bilateral BKA. Skin: Warm and dry. Neuro:  No focal deficits. Psych: Normal affect. Responds appropriately.  Labs    High Sensitivity Troponin:   Recent Labs  Lab 03/20/20  0133 03/20/20 0324 03/21/20 0206 03/21/20 0414  TROPONINIHS 2,819* 3,337* 9,585* 12,625*      Chemistry Recent Labs  Lab 03/20/20 6160 03/21/20 0414 03/22/20 0243 03/23/20 0234 03/24/20 0123  NA 134*   < > 136 135 133*  K 4.2   < > 4.3 3.6 3.5  CL 98   < > 95* 92* 92*  CO2 33*   < > 34* 34* 34*  GLUCOSE 308*   < > 207* 261* 292*  BUN 14   < > 14 18 22   CREATININE 1.39*   < > 1.45* 1.77* 1.91*  CALCIUM 8.5*   < > 8.8* 8.9 8.7*  PROT 8.9*  --   --   --   --   ALBUMIN 3.2*  --   --   --   --   AST 26  --   --   --   --   ALT 10  --   --   --   --   ALKPHOS 51  --   --   --   --   BILITOT 0.3  --   --   --   --   GFRNONAA 56*   < > 53* 42* 38*  ANIONGAP 3*   < > 7 9 7    < > = values in this interval not displayed.     Hematology Recent Labs  Lab 03/22/20 0243 03/23/20 0234 03/24/20 0123  WBC 6.7  5.7 5.6  RBC 3.04* 2.81* 2.87*  HGB 8.8* 8.1* 8.3*  HCT 28.6* 26.5* 27.4*  MCV 94.1 94.3 95.5  MCH 28.9 28.8 28.9  MCHC 30.8 30.6 30.3  RDW 15.8* 15.1 15.0  PLT 250 238 240    BNP Recent Labs  Lab 03/20/20 0119  BNP 213.0*     DDimer No results for input(s): DDIMER in the last 168 hours.   Radiology    No results found.  Cardiac Studies   Echocardiogram 03/20/2020: Impression: 1. Apical hypokinesis. . Left ventricular ejection fraction, by  estimation, is 40 to 45%. The left ventricle has mildly decreased  function. The left ventricle demonstrates regional wall motion  abnormalities (see scoring diagram/findings for  description). There is mild left ventricular hypertrophy. Left ventricular  diastolic parameters are indeterminate.  2. Right ventricular systolic function is normal. The right ventricular  size is normal.  3. Left atrial size was mildly dilated.  4. The mitral valve is normal in structure. No evidence of mitral valve  regurgitation. No evidence of mitral stenosis.  5. The aortic valve has an indeterminant number of cusps. There is mild  calcification of the aortic valve. There is mild thickening of the aortic  valve. Aortic valve regurgitation is not visualized. No aortic stenosis is  present.  6. The inferior vena cava is normal in size with greater than 50%  respiratory variability, suggesting right atrial pressure of 3 mmHg.  _______________  Left Cardiac Catheterization 03/21/2020:  Mid LM to Dist LM lesion is 70% stenosed.  Ost LAD to Prox LAD lesion is 70% stenosed.  Ramus lesion is 70% stenosed.  Ost Cx to Mid Cx lesion is 99% stenosed.  Prox RCA lesion is 40% stenosed.  There is moderate left ventricular systolic dysfunction.  LV end diastolic pressure is severely elevated.  The left ventricular ejection fraction is 35-45% by visual estimate.  1. Severe complex CAD. There is a tapering 70% stenosis in the distal Left main  involving as well the ostium of  the LAD and ramus intermediate with hazy appearance. The LCx is a co-dominant vessel with extensive dissection involving the ostium and proximal vessel and is is subtotally occluded. 2. Moderate LV dysfunction. EF 40-45% 3. Severely elevated LVEDP 30 mm Hg.  Plan: patient is not a candidate for PCI. He is likely not a candidate for CABG as well given multiple co-morbidities. Will discuss with rounding team. Likely manage medically. Prognosis is very poor.   Diagnostic Dominance: Co-dominant     Patient Profile     70 y.o. male with a history of PAD s/p left femoral-popliteal bypass which ultimately required left PKA in 08/2015 due to maggot infestation and right BKA, prior CVA, hypertension, hyperlipidemia, type 2 diabetes on insulin, CKD stage III, hepatitis C, and GERD who presented to [facility] with acute pulmonary edema and hypertensive urgency after presenting with dyspnea and reports of chest pain. He rule in for MI and was ultimately transferred to [facility] for further work-up.  Assessment & Plan    NSTEMI - High-sensitivity troponin peaked at 12,625. - Echo showed LVEF of 40-45% with apical hypokinesis. - LHC on 2/8 showed severe complex CAD including 70% stenosis of mid to distal left main, 70% stenosis of ostial to proximal LAD, 99% stenosis of ostial to mid CX, 70% stenosis of Ramus lesion, and 40% stenosis of proximal RCA. LVEDP severely elevated at 70mmHg.  - Unfortunately patient is not a candidate for PCI or CABG. Therefore, we are treating medically. We completed 48 hours of IV Heparin. Not currently on aspirin or Plavix due to ongoing battles with anemia. Continue Coreg 25mg  twice daily. Crestor has been increased to 20mg  daily.   - He did have some mild chest pain overnight (squeezing around neck). Will increase Amlodipine and Imdur for additional anti-anginal affect.   Acute on Chronic Combined CHF Ischemic Cardiomyopathy  -  BNP 213. - Chest x-ray showed mildly increased interstitial markings throughout both lungs. - Echo showed LVEF of 40-45% with apical hypokinesis.  - LVEDP severely elevated on cath at 47mmHg. - Currently on IV Lasix 80mg  twice daily. Documented urinary output of 1.85 L yesterday and net negative 3.5 L this admission. Looks like weight is down 5lbs from admission. Creatinine continues to rise and is 1.91 today.  - Patient does not appear significantly volume overloaded on exam but does have some wheezing. No crackles. - Will discuss continuation of Lasix with MD. - Continue Coreg as above. - No ACE/ARB given renal function. - Continue to monitor daily weights, strict I/O's, and renal function.   Hypertension - Presented in hypertensive urgency. Improved on Nitro drip. He was weaned off this yesterday. Systolic BP ranging from 782'N to 180's this morning. - Currently on Amlodipine 5mg  daily, Coreg 25mg  twice daily, Imdur 60, and Hydralazine 25mg  three times daily. - Will increase Amlodipine to 10mg  daily and Imdur to 90mg  daily given some chest discomfort overnight.  Hyperlipidemia - LDL 71. - Crestor increased to 20mg  daily.  Acute on CKD Stage III  - Creatinine has been rising the last few days: 1.45 >> 1.77 >> 1.91. Baseline around 1.3.  - Continue to monitor closely.  Otherwise, per primary team: - PAD - Diabetes - Acute anemia: s/p 2 units or PRBCs. Hemoglobin 8.1 today. - Prior CVA  For questions or updates, please contact [facility] Please consult [email or web address] for contact info under        Signed, Darreld Mclean, PA-C  03/24/2020, 8:13 AM  Personally seen and examined. Agree with above.   Mild wheezing, abdomen still feels protuberant On exam-bilateral BKA noted, alert and oriented, scattered wheezes  Peak troponin 12,600  Sinus rhythm on telemetry in the 90s.  Assessment and plan:  Non-STEMI with severe multivessel coronary artery  disease -Non-PCI candidate, discussed with interventional colleagues -Nonsurgical candidate, bilateral BKA as well as recurrent anemia requiring transfusion with inconclusive work-up in the past. -Amlodipine 5 mg, isosorbide 60 mg, carvedilol 25 mg twice daily being utilized as antianginal agents.  Creatinine has continued to rise now 1.91 however still appears to have fluid as manifested by wheezing and protuberant abdomen he states.  We will give him IV Lasix again today 80 mg twice daily.  Tomorrow, may be able to switch to p.o. especially if creatinine continues to increase.  Lives in assisted living.  Candee Furbish, MD

## 2020-03-25 DIAGNOSIS — I214 Non-ST elevation (NSTEMI) myocardial infarction: Secondary | ICD-10-CM | POA: Diagnosis not present

## 2020-03-25 LAB — GLUCOSE, CAPILLARY
Glucose-Capillary: 161 mg/dL — ABNORMAL HIGH (ref 70–99)
Glucose-Capillary: 230 mg/dL — ABNORMAL HIGH (ref 70–99)
Glucose-Capillary: 105 mg/dL — ABNORMAL HIGH (ref 70–99)
Glucose-Capillary: 95 mg/dL (ref 70–99)
Glucose-Capillary: 193 mg/dL — ABNORMAL HIGH (ref 70–99)

## 2020-03-25 LAB — CBC
HCT: 28.5 % — ABNORMAL LOW (ref 39.0–52.0)
Hemoglobin: 8.7 g/dL — ABNORMAL LOW (ref 13.0–17.0)
MCH: 28.7 pg (ref 26.0–34.0)
MCHC: 30.5 g/dL (ref 30.0–36.0)
MCV: 94.1 fL (ref 80.0–100.0)
Platelets: 286 10*3/uL (ref 150–400)
RBC: 3.03 MIL/uL — ABNORMAL LOW (ref 4.22–5.81)
RDW: 14.8 % (ref 11.5–15.5)
WBC: 7 10*3/uL (ref 4.0–10.5)
nRBC: 0 % (ref 0.0–0.2)

## 2020-03-25 LAB — BASIC METABOLIC PANEL
Anion gap: 6 (ref 5–15)
BUN: 21 mg/dL (ref 8–23)
CO2: 35 mmol/L — ABNORMAL HIGH (ref 22–32)
Calcium: 8.6 mg/dL — ABNORMAL LOW (ref 8.9–10.3)
Chloride: 94 mmol/L — ABNORMAL LOW (ref 98–111)
Creatinine, Ser: 1.92 mg/dL — ABNORMAL HIGH (ref 0.61–1.24)
GFR, Estimated: 38 mL/min — ABNORMAL LOW (ref >60)
Glucose, Bld: 277 mg/dL — ABNORMAL HIGH (ref 70–99)
Potassium: 3.6 mmol/L (ref 3.5–5.1)
Sodium: 135 mmol/L (ref 135–145)

## 2020-03-25 LAB — CULTURE, BLOOD (ROUTINE X 2)
Culture: NO GROWTH
Culture: NO GROWTH
Special Requests: ADEQUATE
Special Requests: ADEQUATE

## 2020-03-25 LAB — MAGNESIUM: Magnesium: 2.2 mg/dL (ref 1.7–2.4)

## 2020-03-25 MED ORDER — ISOSORBIDE MONONITRATE ER 60 MG PO TB24
120.0000 mg | ORAL_TABLET | Freq: Every day | ORAL | Status: DC
  Administered 2020-03-26 – 2020-04-08 (×12): 120 mg via ORAL
  Filled 2020-03-25 (×13): qty 2

## 2020-03-25 NOTE — Progress Notes (Signed)
Progress Note  Patient Name: Cory Weber Date of Encounter: 03/25/2020  Primary Cardiologist:   No primary care provider on file.   Subjective   He denies pain or SOB.    Inpatient Medications    Scheduled Meds: . amLODipine  10 mg Oral Daily  . vitamin C  500 mg Oral BID  . aspirin EC  81 mg Oral Daily  . carvedilol  25 mg Oral BID WC  . Chlorhexidine Gluconate Cloth  6 each Topical Q0600  . DULoxetine  60 mg Oral Daily  . ferrous sulfate  325 mg Oral Q breakfast  . furosemide  80 mg Intravenous BID  . heparin  5,000 Units Subcutaneous Q8H  . hydrALAZINE  25 mg Oral Q8H  . insulin aspart  0-15 Units Subcutaneous TID WC  . insulin aspart  0-5 Units Subcutaneous QHS  . insulin aspart  8 Units Subcutaneous TID AC  . insulin glargine  30 Units Subcutaneous QHS  . isosorbide mononitrate  90 mg Oral Daily  . lidocaine  1 patch Transdermal Daily  . mupirocin ointment  1 application Nasal BID  . pantoprazole  40 mg Oral Daily  . polyethylene glycol  17 g Oral Daily  . pregabalin  75 mg Oral BID  . rosuvastatin  20 mg Oral Daily  . senna  1 tablet Oral Daily  . sodium chloride flush  3 mL Intravenous Q12H  . sodium chloride flush  3 mL Intravenous Q12H  . tamsulosin  0.4 mg Oral Daily  . cyanocobalamin  500 mcg Oral Daily   Continuous Infusions: . sodium chloride     PRN Meds: sodium chloride, acetaminophen, baclofen, ipratropium-albuterol, ondansetron (ZOFRAN) IV, sodium chloride flush   Vital Signs    Vitals:   03/25/20 0007 03/25/20 0500 03/25/20 0513 03/25/20 0514  BP: (!) 166/57  (!) 185/70 (!) 185/70  Pulse: (!) 47   (!) 46  Resp: 18   18  Temp: 98.9 F (37.2 C)   98.7 F (37.1 C)  TempSrc: Oral   Oral  SpO2: 95%   98%  Weight: 122.3 kg 122.3 kg      Intake/Output Summary (Last 24 hours) at 03/25/2020 0956 Last data filed at 03/25/2020 0300 Gross per 24 hour  Intake 1314 ml  Output 1250 ml  Net 64 ml   Filed Weights   03/24/20 0425 03/25/20  0007 03/25/20 0500  Weight: 122.6 kg 122.3 kg 122.3 kg    Telemetry    NSR, frequent atrial ectopy - Personally Reviewed  ECG    NA - Personally Reviewed  Physical Exam   GEN: No acute distress.   Neck: No  JVD Cardiac: RRR, no murmurs, rubs, or gallops.  Respiratory: Clear  to auscultation bilaterally.  GI: Soft, nontender, non-distended  MS:    No edema; No deformity.  Status post BKAs.   Neuro:  Nonfocal  Psych: Normal affect   Labs    Chemistry Recent Labs  Lab 03/20/20 0324 03/21/20 0414 03/23/20 0234 03/24/20 0123 03/25/20 0410  NA 134*   < > 135 133* 135  K 4.2   < > 3.6 3.5 3.6  CL 98   < > 92* 92* 94*  CO2 33*   < > 34* 34* 35*  GLUCOSE 308*   < > 261* 292* 277*  BUN 14   < > 18 22 21   CREATININE 1.39*   < > 1.77* 1.91* 1.92*  CALCIUM 8.5*   < > 8.9  8.7* 8.6*  PROT 8.9*  --   --   --   --   ALBUMIN 3.2*  --   --   --   --   AST 26  --   --   --   --   ALT 10  --   --   --   --   ALKPHOS 51  --   --   --   --   BILITOT 0.3  --   --   --   --   GFRNONAA 56*   < > 42* 38* 38*  ANIONGAP 3*   < > 9 7 6    < > = values in this interval not displayed.     Hematology Recent Labs  Lab 03/23/20 0234 03/24/20 0123 03/25/20 0410  WBC 5.7 5.6 7.0  RBC 2.81* 2.87* 3.03*  HGB 8.1* 8.3* 8.7*  HCT 26.5* 27.4* 28.5*  MCV 94.3 95.5 94.1  MCH 28.8 28.9 28.7  MCHC 30.6 30.3 30.5  RDW 15.1 15.0 14.8  PLT 238 240 286    Cardiac EnzymesNo results for input(s): TROPONINI in the last 168 hours. No results for input(s): TROPIPOC in the last 168 hours.   BNP Recent Labs  Lab 03/20/20 0119  BNP 213.0*     DDimer No results for input(s): DDIMER in the last 168 hours.   Radiology    No results found.  Cardiac Studies   Echocardiogram 03/20/2020: Impression: 1. Apical hypokinesis. . Left ventricular ejection fraction, by  estimation, is 40 to 45%. The left ventricle has mildly decreased  function. The left ventricle demonstrates regional wall motion   abnormalities (see scoring diagram/findings for  description). There is mild left ventricular hypertrophy. Left ventricular  diastolic parameters are indeterminate.  2. Right ventricular systolic function is normal. The right ventricular  size is normal.  3. Left atrial size was mildly dilated.  4. The mitral valve is normal in structure. No evidence of mitral valve  regurgitation. No evidence of mitral stenosis.  5. The aortic valve has an indeterminant number of cusps. There is mild  calcification of the aortic valve. There is mild thickening of the aortic  valve. Aortic valve regurgitation is not visualized. No aortic stenosis is  present.  6. The inferior vena cava is normal in size with greater than 50%  respiratory variability, suggesting right atrial pressure of 3 mmHg.  _______________  Left Cardiac Catheterization 03/21/2020:  Mid LM to Dist LM lesion is 70% stenosed.  Ost LAD to Prox LAD lesion is 70% stenosed.  Ramus lesion is 70% stenosed.  Ost Cx to Mid Cx lesion is 99% stenosed.  Prox RCA lesion is 40% stenosed.  There is moderate left ventricular systolic dysfunction.  LV end diastolic pressure is severely elevated.  The left ventricular ejection fraction is 35-45% by visual estimate.  1. Severe complex CAD. There is a tapering 70% stenosis in the distal Left main involving as well the ostium of the LAD and ramus intermediate with hazy appearance. The LCx is a co-dominant vessel with extensive dissection involving the ostium and proximal vessel and is is subtotally occluded. 2. Moderate LV dysfunction. EF 40-45% 3. Severely elevated LVEDP 30 mm Hg.  Patient Profile     70 y.o. 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 He rule in for MI and was ultimately transferred to [facility] for further work-up.   Assessment & Plan    CAD:  Medical management.   Therapy is very limited    I will increase the Imdur as below  ACUTE ON CHRONIC SYSTOLIC AND DIASTOLIC HF:   Net negative 3.8 liters.  Continue current diuresis   HTN:   Coreg increased yesterday.  IV NTG discontinued yesterday with Imdur started.   BP remains elevated overall.  I will increase Imdur.     AKI ON CKD IIIA:    Creat is stable.  Follow    For questions or updates, please contact [place] Please consult [email or web address] for contact info under Cardiology/STEMI.   Signed, Minus Breeding, MD  03/25/2020, 9:56 AM

## 2020-03-25 NOTE — Progress Notes (Signed)
PROGRESS NOTE    Cory Weber  QMG:[id number] DOB: 11/28/1950 DOA: 03/20/2020 PCP: Iona Beard, MD   Brief Narrative: 70 year old with past medical history significant for CKD, CVA, hepatitis C, bilateral BKA, hyperlipidemia, BPH, GERD, anemia, asthma who presents complaining of acute onset shortness of breath.  Patient is oxygen dependent and normally wears 2 L of oxygen at baseline.  His pulse ox dropped into the 70s and he was placed on nonrebreather when he arrived.  He has not had cough or fever.  He had Covid 2 months ago.  He denies chest pain, fever, diarrhea.  He does report that he has had occasional black stool over the last month.  He reports shortness of breath when lying flat.  He did have chest pain several days prior to admission.  He lives at a skilled nursing facility.  Patient was found to have acute hypoxic respiratory failure secondary to heart failure exacerbation and non-STEMI.  Patient was transferred from [facility] to [facility] for cardiac catheterization. Cardiac cath performed 2/8 show:  Severe complex CAD. There is a tapering 70% stenosis in the distal Left main involving as well the ostium of the LAD and ramus intermediate with hazy appearance. The LCx is a co-dominant vessel with extensive dissection involving the ostium and proximal vessel and is is subtotally occluded. Moderate LV dysfunction. EF 40-45%. Severely elevated LVEDP 30 mm Hg.    Assessment & Plan:   Principal Problem:   NSTEMI (non-ST elevated myocardial infarction) ([place]) Active Problems:   Polysubstance abuse ([place])   Tobacco abuse   Melena   Chronic kidney disease, stage 3 (HCC)   Essential hypertension   Chronic hepatitis C without hepatic coma (HCC)   History of CVA (cerebrovascular accident) without residual deficits   Phantom limb pain (HCC)   Anemia of chronic disease   PVD (peripheral vascular disease) (HCC)   Amputation of left lower extremity below knee (HCC)   GERD  (gastroesophageal reflux disease)   Edema of right lower extremity   Uncontrolled type II diabetes with peripheral autonomic neuropathy (HCC)   Dyslipidemia associated with type 2 diabetes mellitus (HCC)   Physical deconditioning   Anemia, chronic disease   Chronic diastolic CHF (congestive heart failure) ([place])   PAD (peripheral artery disease) ([place])   DNR (do not resuscitate)   Heart failure ([place])   1-Non-STEMI: -Patient was treated with heparin drip and nitroglycerin drip.  Transition to Imdur.  -Troponin peaked at 12,625. -Cardiology consulted, patient underwent cardiac catheterization 12/8 which showed: Severe complex CAD. There is a tapering 70% stenosis in the distal Left main involving as well the ostium of the LAD and ramus intermediate with hazy appearance. The LCx is a co-dominant vessel with extensive dissection involving the ostium and proximal vessel and is is subtotally occluded. -Medical management.  No acute option for PCI, per cardiology patient is not a good candidate for CABG. -imdur increase to 120 mg daily today.  -Continue with Norvasc.  -denies chest pain.   2-Acute systolic/diastolic heart failure exacerbation with acute hypoxic respiratory failure and pulmonary edema: -Patient presented with oxygen saturation in the 70s, initially placed on nonrebreather, currently on 3 L. -Chest x-ray show pulmonary edema, BNP 213.  Echo ejection fraction 40 to 45%. -Started on IV lasix 2/09. -Continue with IV lasix.  Negative 3.6 L -Monitor renal function.   3-Hypertensive urgency: Treated initially with nitroglycerin drip.  Continue with  Hydralazine, Norvasc, coreg.  BP better controlled.   4-Hyperlipidemia: Continue  with statin 5-history of CVA: Continue with aspirin statin and Plavix  GERD: Continue with PPI  Hepatitis C: May follow-up as an outpatient  Diabetes type 2 with nephropathy: Continue with Lantus and sliding scale insulin.  Anemia of chronic  disease: Hemoglobin baseline 9 Received 1 unit packed red blood cell this admission.  Hb stable at 8.  Peripheral vascular disease: Status post left BKA 2017, right BKA 2018  Morbid obesity BMI 42 Need lifestyle modification  Chronic kidney disease a stage IIIa: Continue to monitor Cr increase to 1. 7---1.9--1.9 Monitor closely on lasix.      Estimated body mass index is 42.23 kg/m as calculated from the following:   Height as of 10/23/19: 5\' 7"  (1.702 m).   Weight as of this encounter: 122.3 kg.   DVT prophylaxis: Heparin  Code Status: DNR Family Communication: Daughter over phone 2/11 Disposition Plan:  Status is: Inpatient  Remains inpatient appropriate because:Ongoing active pain requiring inpatient pain management   Dispo: The patient is from: Home              Anticipated d/c is to: Home              Anticipated d/c date is: 3 days              Patient currently is not medically stable to d/c.   Difficult to place patient No        Consultants:   Cardiology   Procedures:    CATH; Mid LM to Dist LM lesion is 70% stenosed.  Ost LAD to Prox LAD lesion is 70% stenosed.  Ramus lesion is 70% stenosed.  Ost Cx to Mid Cx lesion is 99% stenosed.  Prox RCA lesion is 40% stenosed.  There is moderate left ventricular systolic dysfunction.  LV end diastolic pressure is severely elevated.  The left ventricular ejection fraction is 35-45% by visual estimate.   1. Severe complex CAD. There is a tapering 70% stenosis in the distal Left main involving as well the ostium of the LAD and ramus intermediate with hazy appearance. The LCx is a co-dominant vessel with extensive dissection involving the ostium and proximal vessel and is is subtotally occluded. 2. Moderate LV dysfunction. EF 40-45% 3. Severely elevated LVEDP 30 mm Hg.   Antimicrobials:    Subjective: Denies chest pain, dyspnea improving.   Objective: Vitals:   03/25/20 0007 03/25/20 0500  03/25/20 0513 03/25/20 0514  BP: (!) 166/57  (!) 185/70 (!) 185/70  Pulse: (!) 47   (!) 46  Resp: 18   18  Temp: 98.9 F (37.2 C)   98.7 F (37.1 C)  TempSrc: Oral   Oral  SpO2: 95%   98%  Weight: 122.3 kg 122.3 kg      Intake/Output Summary (Last 24 hours) at 03/25/2020 1340 Last data filed at 03/25/2020 0300 Gross per 24 hour  Intake 597 ml  Output 1250 ml  Net -653 ml   Filed Weights   03/24/20 0425 03/25/20 0007 03/25/20 0500  Weight: 122.6 kg 122.3 kg 122.3 kg    Examination:  General exam: NAD Respiratory system: B/L crackles.  Cardiovascular system: S 1, S 2 RRR Gastrointestinal system: BS present, soft, nt, less distended.  Central nervous system: Alert,  Extremities: BL BKA    Data Reviewed: I have personally reviewed following labs and imaging studies  CBC: Recent Labs  Lab 03/21/20 0414 03/21/20 1516 03/22/20 0243 03/23/20 0234 03/24/20 0123 03/25/20 0410  WBC 6.9  --  6.7 5.7 5.6 7.0  HGB 7.9* 9.1* 8.8* 8.1* 8.3* 8.7*  HCT 26.1* 29.4* 28.6* 26.5* 27.4* 28.5*  MCV 97.8  --  94.1 94.3 95.5 94.1  PLT 226  --  250 238 240 563   Basic Metabolic Panel: Recent Labs  Lab 03/21/20 0414 03/21/20 0901 03/22/20 0243 03/23/20 0234 03/24/20 0123 03/25/20 0410  NA 135  --  136 135 133* 135  K 3.6  --  4.3 3.6 3.5 3.6  CL 95*  --  95* 92* 92* 94*  CO2 33*  --  34* 34* 34* 35*  GLUCOSE 140*  --  207* 261* 292* 277*  BUN 14  --  14 18 22 21   CREATININE 1.33* 1.34* 1.45* 1.77* 1.91* 1.92*  CALCIUM 8.6*  --  8.8* 8.9 8.7* 8.6*  MG 2.0  --  2.1 2.3 2.3 2.2   GFR: Estimated Creatinine Clearance: 46.8 mL/min (A) (by C-G formula based on SCr of 1.92 mg/dL (H)). Liver Function Tests: Recent Labs  Lab 03/20/20 0324  AST 26  ALT 10  ALKPHOS 51  BILITOT 0.3  PROT 8.9*  ALBUMIN 3.2*   No results for input(s): LIPASE, AMYLASE in the last 168 hours. No results for input(s): AMMONIA in the last 168 hours. Coagulation Profile: Recent Labs  Lab  03/20/20 0119  INR 1.0   Cardiac Enzymes: No results for input(s): CKTOTAL, CKMB, CKMBINDEX, TROPONINI in the last 168 hours. BNP (last 3 results) No results for input(s): PROBNP in the last 8760 hours. HbA1C: No results for input(s): HGBA1C in the last 72 hours. CBG: Recent Labs  Lab 03/24/20 1549 03/24/20 2037 03/25/20 0303 03/25/20 0631 03/25/20 1154  GLUCAP 135* 192* 161* 230* 105*   Lipid Profile: No results for input(s): CHOL, HDL, LDLCALC, TRIG, CHOLHDL, LDLDIRECT in the last 72 hours. Thyroid Function Tests: No results for input(s): TSH, T4TOTAL, FREET4, T3FREE, THYROIDAB in the last 72 hours. Anemia Panel: No results for input(s): VITAMINB12, FOLATE, FERRITIN, TIBC, IRON, RETICCTPCT in the last 72 hours. Sepsis Labs: Recent Labs  Lab 03/20/20 0121 03/20/20 0324  LATICACIDVEN 2.1* 1.6    Recent Results (from the past 240 hour(s))  Culture, blood (Routine x 2)     Status: None   Collection Time: 03/20/20  1:15 AM   Specimen: BLOOD  Result Value Ref Range Status   Specimen Description BLOOD BLOOD LEFT HAND  Final   Special Requests   Final    BOTTLES DRAWN AEROBIC AND ANAEROBIC Blood Culture adequate volume   Culture   Final    NO GROWTH 5 DAYS Performed at [facility], [address], [place], [place] [address]    Report Status 03/25/2020 FINAL  Final  Culture, blood (Routine x 2)     Status: None   Collection Time: 03/20/20  1:19 AM   Specimen: BLOOD  Result Value Ref Range Status   Specimen Description BLOOD SITE NOT SPECIFIED  Final   Special Requests   Final    BOTTLES DRAWN AEROBIC AND ANAEROBIC Blood Culture adequate volume   Culture   Final    NO GROWTH 5 DAYS Performed at [facility], [address], [place],  [address]    Report Status 03/25/2020 FINAL  Final  SARS Coronavirus 2 by RT PCR (hospital order, performed in [facility] hospital lab) Nasopharyngeal Nasopharyngeal Swab     Status: None   Collection Time: 03/20/20  1:20 AM    Specimen: Nasopharyngeal Swab  Result Value Ref Range Status  SARS Coronavirus 2 NEGATIVE NEGATIVE Final    Comment: (NOTE) SARS-CoV-2 target nucleic acids are NOT DETECTED.  The SARS-CoV-2 RNA is generally detectable in upper and lower respiratory specimens during the acute phase of infection. The lowest concentration of SARS-CoV-2 viral copies this assay can detect is 250 copies / mL. A negative result does not preclude SARS-CoV-2 infection and should not be used as the sole basis for treatment or other patient management decisions.  A negative result may occur with improper specimen collection / handling, submission of specimen other than nasopharyngeal swab, presence of viral mutation(s) within the areas targeted by this assay, and inadequate number of viral copies (<250 copies / mL). A negative result must be combined with clinical observations, patient history, and epidemiological information.  Fact Sheet for Patients:   StrictlyIdeas.no  Fact Sheet for Healthcare Providers: BankingDealers.co.za  This test is not yet approved or  cleared by the [place] FDA and has been authorized for detection and/or diagnosis of SARS-CoV-2 by FDA under an Emergency Use Authorization (EUA).  This EUA will remain in effect (meaning this test can be used) for the duration of the COVID-19 declaration under Section 564(b)(1) of the Act, 21 U.S.C. section 360bbb-3(b)(1), unless the authorization is terminated or revoked sooner.  Performed at [facility], [address], [place], [place] [address]   MRSA PCR Screening     Status: Abnormal   Collection Time: 03/21/20  6:59 AM   Specimen: Nasal Mucosa; Nasopharyngeal  Result Value Ref Range Status   MRSA by PCR POSITIVE (A) NEGATIVE Final    Comment:        The GeneXpert MRSA Assay (FDA approved for NASAL specimens only), is one component of a comprehensive MRSA colonization surveillance  program. It is not intended to diagnose MRSA infection nor to guide or monitor treatment for MRSA infections. RESULT CALLED TO, READ BACK BY AND VERIFIED WITH: RN L.KING AT 0076 ON 03/21/2020 BY T.SAAD Performed at [place] [address] , [place] [address]          Radiology Studies: No results found.      Scheduled Meds: . amLODipine  10 mg Oral Daily  . vitamin C  500 mg Oral BID  . aspirin EC  81 mg Oral Daily  . carvedilol  25 mg Oral BID WC  . Chlorhexidine Gluconate Cloth  6 each Topical Q0600  . DULoxetine  60 mg Oral Daily  . ferrous sulfate  325 mg Oral Q breakfast  . furosemide  80 mg Intravenous BID  . heparin  5,000 Units Subcutaneous Q8H  . hydrALAZINE  25 mg Oral Q8H  . insulin aspart  0-15 Units Subcutaneous TID WC  . insulin aspart  0-5 Units Subcutaneous QHS  . insulin aspart  8 Units Subcutaneous TID AC  . insulin glargine  30 Units Subcutaneous QHS  . [START ON 03/26/2020] isosorbide mononitrate  120 mg Oral Daily  . lidocaine  1 patch Transdermal Daily  . mupirocin ointment  1 application Nasal BID  . pantoprazole  40 mg Oral Daily  . polyethylene glycol  17 g Oral Daily  . pregabalin  75 mg Oral BID  . rosuvastatin  20 mg Oral Daily  . senna  1 tablet Oral Daily  . sodium chloride flush  3 mL Intravenous Q12H  . sodium chloride flush  3 mL Intravenous Q12H  . tamsulosin  0.4 mg Oral Daily  . cyanocobalamin  500 mcg Oral Daily  Continuous Infusions: . sodium chloride       LOS: 5 days    Time spent: 35 minutes.     Elmarie Shiley, MD [facility]   If 7PM-7AM, please contact night-coverage [email or web address]  03/25/2020, 1:40 PM

## 2020-03-26 DIAGNOSIS — I214 Non-ST elevation (NSTEMI) myocardial infarction: Secondary | ICD-10-CM | POA: Diagnosis not present

## 2020-03-26 LAB — BASIC METABOLIC PANEL
Anion gap: 7 (ref 5–15)
BUN: 24 mg/dL — ABNORMAL HIGH (ref 8–23)
CO2: 35 mmol/L — ABNORMAL HIGH (ref 22–32)
Calcium: 8.7 mg/dL — ABNORMAL LOW (ref 8.9–10.3)
Chloride: 94 mmol/L — ABNORMAL LOW (ref 98–111)
Creatinine, Ser: 1.81 mg/dL — ABNORMAL HIGH (ref 0.61–1.24)
GFR, Estimated: 40 mL/min — ABNORMAL LOW (ref >60)
Glucose, Bld: 191 mg/dL — ABNORMAL HIGH (ref 70–99)
Potassium: 3.6 mmol/L (ref 3.5–5.1)
Sodium: 136 mmol/L (ref 135–145)

## 2020-03-26 LAB — GLUCOSE, CAPILLARY
Glucose-Capillary: 195 mg/dL — ABNORMAL HIGH (ref 70–99)
Glucose-Capillary: 182 mg/dL — ABNORMAL HIGH (ref 70–99)
Glucose-Capillary: 232 mg/dL — ABNORMAL HIGH (ref 70–99)
Glucose-Capillary: 133 mg/dL — ABNORMAL HIGH (ref 70–99)
Glucose-Capillary: 237 mg/dL — ABNORMAL HIGH (ref 70–99)

## 2020-03-26 MED ORDER — HYDRALAZINE HCL 50 MG PO TABS
50.0000 mg | ORAL_TABLET | Freq: Three times a day (TID) | ORAL | Status: DC
  Administered 2020-03-26 – 2020-04-08 (×38): 50 mg via ORAL
  Filled 2020-03-26 (×39): qty 1

## 2020-03-26 NOTE — Progress Notes (Signed)
PROGRESS NOTE    Cory Weber  IOE:[id number] DOB: 20-Aug-1950 DOA: 03/20/2020 PCP: Iona Beard, MD   Brief Narrative: 70 year old with past medical history significant for CKD, CVA, hepatitis C, bilateral BKA, hyperlipidemia, BPH, GERD, anemia, asthma who presents complaining of acute onset shortness of breath.  Patient is oxygen dependent and normally wears 2 L of oxygen at baseline.  His pulse ox dropped into the 70s and he was placed on nonrebreather when he arrived.  He has not had cough or fever.  He had Covid 2 months ago.  He denies chest pain, fever, diarrhea.  He does report that he has had occasional black stool over the last month.  He reports shortness of breath when lying flat.  He did have chest pain several days prior to admission.  He lives at a skilled nursing facility.  Patient was found to have acute hypoxic respiratory failure secondary to heart failure exacerbation and non-STEMI.  Patient was transferred from [facility] to [facility] for cardiac catheterization. Cardiac cath performed 2/8 show:  Severe complex CAD. There is a tapering 70% stenosis in the distal Left main involving as well the ostium of the LAD and ramus intermediate with hazy appearance. The LCx is a co-dominant vessel with extensive dissection involving the ostium and proximal vessel and is is subtotally occluded. Moderate LV dysfunction. EF 40-45%. Severely elevated LVEDP 30 mm Hg.    Assessment & Plan:   Principal Problem:   NSTEMI (non-ST elevated myocardial infarction) ([place]) Active Problems:   Polysubstance abuse ([place])   Tobacco abuse   Melena   Chronic kidney disease, stage 3 (HCC)   Essential hypertension   Chronic hepatitis C without hepatic coma (HCC)   History of CVA (cerebrovascular accident) without residual deficits   Phantom limb pain (HCC)   Anemia of chronic disease   PVD (peripheral vascular disease) (HCC)   Amputation of left lower extremity below knee (HCC)   GERD  (gastroesophageal reflux disease)   Edema of right lower extremity   Uncontrolled type II diabetes with peripheral autonomic neuropathy (HCC)   Dyslipidemia associated with type 2 diabetes mellitus (HCC)   Physical deconditioning   Anemia, chronic disease   Chronic diastolic CHF (congestive heart failure) ([place])   PAD (peripheral artery disease) ([place])   DNR (do not resuscitate)   Heart failure ([place])   1-Non-STEMI: -Patient was treated with heparin drip and nitroglycerin drip.  Transition to Imdur.  -Troponin peaked at 12,625. -Cardiology consulted, patient underwent cardiac catheterization 12/8 which showed: Severe complex CAD. There is a tapering 70% stenosis in the distal Left main involving as well the ostium of the LAD and ramus intermediate with hazy appearance. The LCx is a co-dominant vessel with extensive dissection involving the ostium and proximal vessel and is is subtotally occluded. -Medical management.  No acute option for PCI, per cardiology patient is not a good candidate for CABG. -imdur increase to 120 mg daily today.  -Continue with Norvasc.  -Denies chest pain.  -Already on aspirin/   2-Acute systolic/diastolic heart failure exacerbation with acute hypoxic respiratory failure and pulmonary edema: -Patient presented with oxygen saturation in the 70s, initially placed on nonrebreather, currently on 3 L. -Chest x-ray show pulmonary edema, BNP 213.  Echo ejection fraction 40 to 45%. -Started on IV lasix 2/09. -Continue with IV lasix.  Negative 6 L -Monitor renal function.  -Continue with lasix.   3-Hypertensive urgency: Treated initially with nitroglycerin drip.  Continue with  Hydralazine, Norvasc, coreg.  BP better controlled.   4-Hyperlipidemia: Continue with statin 5-history of CVA: Continue with aspirin statin  plavix held due to anemia.   GERD: Continue with PPI  Hepatitis C: May follow-up as an outpatient  Diabetes type 2 with nephropathy: Continue  with Lantus and sliding scale insulin.  Anemia of chronic disease: Hemoglobin baseline 9 Received 1 unit packed red blood cell this admission.  Hb stable at 8. Repeat labs in am.  Checking occult blood.   Peripheral vascular disease: Status post left BKA 2017, right BKA 2018  Morbid obesity BMI 42 Need lifestyle modification  Chronic kidney disease a stage IIIa: Continue to monitor Cr increase to 1. 7---1.9--1.9--1.8 Monitor closely on lasix.      Estimated body mass index is 42.02 kg/m as calculated from the following:   Height as of 10/23/19: 5\' 7"  (1.702 m).   Weight as of this encounter: 121.7 kg.   DVT prophylaxis: Heparin  Code Status: DNR Family Communication: Daughter over phone 2/11 Disposition Plan:  Status is: Inpatient  Remains inpatient appropriate because:Ongoing active pain requiring inpatient pain management   Dispo: The patient is from: Home              Anticipated d/c is to: Home              Anticipated d/c date is: 3 days              Patient currently is not medically stable to d/c.   Difficult to place patient No        Consultants:   Cardiology   Procedures:    CATH; Mid LM to Dist LM lesion is 70% stenosed.  Ost LAD to Prox LAD lesion is 70% stenosed.  Ramus lesion is 70% stenosed.  Ost Cx to Mid Cx lesion is 99% stenosed.  Prox RCA lesion is 40% stenosed.  There is moderate left ventricular systolic dysfunction.  LV end diastolic pressure is severely elevated.  The left ventricular ejection fraction is 35-45% by visual estimate.   1. Severe complex CAD. There is a tapering 70% stenosis in the distal Left main involving as well the ostium of the LAD and ramus intermediate with hazy appearance. The LCx is a co-dominant vessel with extensive dissection involving the ostium and proximal vessel and is is subtotally occluded. 2. Moderate LV dysfunction. EF 40-45% 3. Severely elevated LVEDP 30 mm Hg.   Antimicrobials:     Subjective: He is breathing better, denies dyspnea.   Objective: Vitals:   03/26/20 0833 03/26/20 1122 03/26/20 1414 03/26/20 1519  BP: 131/80 122/74 126/72 116/83  Pulse: 87   (!) 45  Resp:   18 18  Temp: 98.9 F (37.2 C) 97.7 F (36.5 C)  98.1 F (36.7 C)  TempSrc: Oral Oral  Oral  SpO2: 97%   (!) 85%  Weight:        Intake/Output Summary (Last 24 hours) at 03/26/2020 1652 Last data filed at 03/26/2020 1522 Gross per 24 hour  Intake 750 ml  Output 1950 ml  Net -1200 ml   Filed Weights   03/25/20 0500 03/26/20 0015 03/26/20 0423  Weight: 122.3 kg 121.7 kg 121.7 kg    Examination:  General exam: NAD Respiratory system: B/L crackles.  Cardiovascular system: S 1, S 2 RRR Gastrointestinal system: BS present, soft, nt.  Central nervous system: alert Extremities: B/L BKA    Data Reviewed: I have personally reviewed following labs and imaging studies  CBC: Recent Labs  Lab  03/21/20 0414 03/21/20 1516 03/22/20 0243 03/23/20 0234 03/24/20 0123 03/25/20 0410  WBC 6.9  --  6.7 5.7 5.6 7.0  HGB 7.9* 9.1* 8.8* 8.1* 8.3* 8.7*  HCT 26.1* 29.4* 28.6* 26.5* 27.4* 28.5*  MCV 97.8  --  94.1 94.3 95.5 94.1  PLT 226  --  250 238 240 941   Basic Metabolic Panel: Recent Labs  Lab 03/21/20 0414 03/21/20 0901 03/22/20 0243 03/23/20 0234 03/24/20 0123 03/25/20 0410 03/26/20 0326  NA 135  --  136 135 133* 135 136  K 3.6  --  4.3 3.6 3.5 3.6 3.6  CL 95*  --  95* 92* 92* 94* 94*  CO2 33*  --  34* 34* 34* 35* 35*  GLUCOSE 140*  --  207* 261* 292* 277* 191*  BUN 14  --  14 18 22 21  24*  CREATININE 1.33*   < > 1.45* 1.77* 1.91* 1.92* 1.81*  CALCIUM 8.6*  --  8.8* 8.9 8.7* 8.6* 8.7*  MG 2.0  --  2.1 2.3 2.3 2.2  --    < > = values in this interval not displayed.   GFR: Estimated Creatinine Clearance: 49.5 mL/min (A) (by C-G formula based on SCr of 1.81 mg/dL (H)). Liver Function Tests: Recent Labs  Lab 03/20/20 0324  AST 26  ALT 10  ALKPHOS 51  BILITOT 0.3   PROT 8.9*  ALBUMIN 3.2*   No results for input(s): LIPASE, AMYLASE in the last 168 hours. No results for input(s): AMMONIA in the last 168 hours. Coagulation Profile: Recent Labs  Lab 03/20/20 0119  INR 1.0   Cardiac Enzymes: No results for input(s): CKTOTAL, CKMB, CKMBINDEX, TROPONINI in the last 168 hours. BNP (last 3 results) No results for input(s): PROBNP in the last 8760 hours. HbA1C: No results for input(s): HGBA1C in the last 72 hours. CBG: Recent Labs  Lab 03/25/20 2205 03/26/20 0254 03/26/20 0607 03/26/20 1125 03/26/20 1520  GLUCAP 193* 195* 182* 232* 133*   Lipid Profile: No results for input(s): CHOL, HDL, LDLCALC, TRIG, CHOLHDL, LDLDIRECT in the last 72 hours. Thyroid Function Tests: No results for input(s): TSH, T4TOTAL, FREET4, T3FREE, THYROIDAB in the last 72 hours. Anemia Panel: No results for input(s): VITAMINB12, FOLATE, FERRITIN, TIBC, IRON, RETICCTPCT in the last 72 hours. Sepsis Labs: Recent Labs  Lab 03/20/20 0121 03/20/20 0324  LATICACIDVEN 2.1* 1.6    Recent Results (from the past 240 hour(s))  Culture, blood (Routine x 2)     Status: None   Collection Time: 03/20/20  1:15 AM   Specimen: BLOOD  Result Value Ref Range Status   Specimen Description BLOOD BLOOD LEFT HAND  Final   Special Requests   Final    BOTTLES DRAWN AEROBIC AND ANAEROBIC Blood Culture adequate volume   Culture   Final    NO GROWTH 5 DAYS Performed at [facility], [address], [place], [place] [address]    Report Status 03/25/2020 FINAL  Final  Culture, blood (Routine x 2)     Status: None   Collection Time: 03/20/20  1:19 AM   Specimen: BLOOD  Result Value Ref Range Status   Specimen Description BLOOD SITE NOT SPECIFIED  Final   Special Requests   Final    BOTTLES DRAWN AEROBIC AND ANAEROBIC Blood Culture adequate volume   Culture   Final    NO GROWTH 5 DAYS Performed at [facility], [address], [place], [place] [address]    Report Status 03/25/2020  FINAL  Final  SARS Coronavirus 2 by RT PCR (hospital order, performed in [facility] hospital lab) Nasopharyngeal Nasopharyngeal Swab     Status: None   Collection Time: 03/20/20  1:20 AM   Specimen: Nasopharyngeal Swab  Result Value Ref Range Status   SARS Coronavirus 2 NEGATIVE NEGATIVE Final    Comment: (NOTE) SARS-CoV-2 target nucleic acids are NOT DETECTED.  The SARS-CoV-2 RNA is generally detectable in upper and lower respiratory specimens during the acute phase of infection. The lowest concentration of SARS-CoV-2 viral copies this assay can detect is 250 copies / mL. A negative result does not preclude SARS-CoV-2 infection and should not be used as the sole basis for treatment or other patient management decisions.  A negative result may occur with improper specimen collection / handling, submission of specimen other than nasopharyngeal swab, presence of viral mutation(s) within the areas targeted by this assay, and inadequate number of viral copies (<250 copies / mL). A negative result must be combined with clinical observations, patient history, and epidemiological information.  Fact Sheet for Patients:   StrictlyIdeas.no  Fact Sheet for Healthcare Providers: BankingDealers.co.za  This test is not yet approved or  cleared by the [place] FDA and has been authorized for detection and/or diagnosis of SARS-CoV-2 by FDA under an Emergency Use Authorization (EUA).  This EUA will remain in effect (meaning this test can be used) for the duration of the COVID-19 declaration under Section 564(b)(1) of the Act, 21 U.S.C. section 360bbb-3(b)(1), unless the authorization is terminated or revoked sooner.  Performed at [facility], [address] [place], [place] [address]   MRSA PCR Screening     Status: Abnormal   Collection Time: 03/21/20  6:59 AM   Specimen: Nasal Mucosa; Nasopharyngeal  Result Value Ref Range Status   MRSA  by PCR POSITIVE (A) NEGATIVE Final    Comment:        The GeneXpert MRSA Assay (FDA approved for NASAL specimens only), is one component of a comprehensive MRSA colonization surveillance program. It is not intended to diagnose MRSA infection nor to guide or monitor treatment for MRSA infections. RESULT CALLED TO, READ BACK BY AND VERIFIED WITH: RN L.KING AT 2353 ON 03/21/2020 BY T.SAAD Performed at [facility] [address], [place], [place] [address]          Radiology Studies: No results found.      Scheduled Meds: . amLODipine  10 mg Oral Daily  . vitamin C  500 mg Oral BID  . aspirin EC  81 mg Oral Daily  . carvedilol  25 mg Oral BID WC  . DULoxetine  60 mg Oral Daily  . ferrous sulfate  325 mg Oral Q breakfast  . furosemide  80 mg Intravenous BID  . heparin  5,000 Units Subcutaneous Q8H  . hydrALAZINE  50 mg Oral Q8H  . insulin aspart  0-15 Units Subcutaneous TID WC  . insulin aspart  0-5 Units Subcutaneous QHS  . insulin aspart  8 Units Subcutaneous TID AC  . insulin glargine  30 Units Subcutaneous QHS  . isosorbide mononitrate  120 mg Oral Daily  . lidocaine  1 patch Transdermal Daily  . pantoprazole  40 mg Oral Daily  . polyethylene glycol  17 g Oral Daily  . pregabalin  75 mg Oral BID  . rosuvastatin  20 mg Oral Daily  . senna  1 tablet Oral Daily  . sodium chloride flush  3 mL Intravenous Q12H  . sodium  chloride flush  3 mL Intravenous Q12H  . tamsulosin  0.4 mg Oral Daily  . cyanocobalamin  500 mcg Oral Daily   Continuous Infusions: . sodium chloride       LOS: 6 days    Time spent: 35 minutes.     Elmarie Shiley, MD [facility]   If 7PM-7AM, please contact night-coverage [email or web address]  03/26/2020, 4:52 PM

## 2020-03-26 NOTE — Plan of Care (Signed)
  Problem: Activity: Goal: Risk for activity intolerance will decrease Outcome: Progressing   Problem: Coping: Goal: Level of anxiety will decrease Outcome: Progressing   Problem: Safety: Goal: Ability to remain free from injury will improve Outcome: Progressing   

## 2020-03-26 NOTE — Progress Notes (Signed)
Progress Note  Patient Name: Cory Weber Date of Encounter: 03/26/2020  Primary Cardiologist:   No primary care provider on file.   Subjective   No pain.  No SOB.  [place] [employer] [place]  Inpatient Medications    Scheduled Meds: . amLODipine  10 mg Oral Daily  . vitamin C  500 mg Oral BID  . aspirin EC  81 mg Oral Daily  . carvedilol  25 mg Oral BID WC  . DULoxetine  60 mg Oral Daily  . ferrous sulfate  325 mg Oral Q breakfast  . furosemide  80 mg Intravenous BID  . heparin  5,000 Units Subcutaneous Q8H  . hydrALAZINE  50 mg Oral Q8H  . insulin aspart  0-15 Units Subcutaneous TID WC  . insulin aspart  0-5 Units Subcutaneous QHS  . insulin aspart  8 Units Subcutaneous TID AC  . insulin glargine  30 Units Subcutaneous QHS  . isosorbide mononitrate  120 mg Oral Daily  . lidocaine  1 patch Transdermal Daily  . pantoprazole  40 mg Oral Daily  . polyethylene glycol  17 g Oral Daily  . pregabalin  75 mg Oral BID  . rosuvastatin  20 mg Oral Daily  . senna  1 tablet Oral Daily  . sodium chloride flush  3 mL Intravenous Q12H  . sodium chloride flush  3 mL Intravenous Q12H  . tamsulosin  0.4 mg Oral Daily  . cyanocobalamin  500 mcg Oral Daily   Continuous Infusions: . sodium chloride     PRN Meds: sodium chloride, acetaminophen, baclofen, ipratropium-albuterol, ondansetron (ZOFRAN) IV, sodium chloride flush   Vital Signs    Vitals:   03/26/20 0314 03/26/20 0423 03/26/20 0640 03/26/20 0723  BP: (!) 146/72  (!) 162/67 118/70  Pulse:    84  Resp: 17   16  Temp: 98.8 F (37.1 C)   98.6 F (37 C)  TempSrc: Oral   Oral  SpO2: 98%   96%  Weight:  121.7 kg      Intake/Output Summary (Last 24 hours) at 03/26/2020 0739 Last data filed at 03/26/2020 0316 Gross per 24 hour  Intake 510 ml  Output 2850 ml  Net -2340 ml   Filed Weights   03/25/20 0500 03/26/20 0015 03/26/20 0423  Weight: 122.3 kg 121.7 kg 121.7 kg    Telemetry    NSR - Personally Reviewed  ECG     NA - Personally Reviewed  Physical Exam   GEN: No  acute distress.   Neck: No  JVD Cardiac: RRR, no murmurs, rubs, or gallops.  Respiratory:     Decreased breath sounds without wheezing or crackles.  GI: Soft, nontender, non-distended, normal bowel sounds  MS:  Mild body edema; No deformity.  Status post bilateral BKAs Neuro:   Nonfocal  Psych: Oriented and appropriate    Labs    Chemistry Recent Labs  Lab 03/20/20 0324 03/21/20 0414 03/24/20 0123 03/25/20 0410 03/26/20 0326  NA 134*   < > 133* 135 136  K 4.2   < > 3.5 3.6 3.6  CL 98   < > 92* 94* 94*  CO2 33*   < > 34* 35* 35*  GLUCOSE 308*   < > 292* 277* 191*  BUN 14   < > 22 21 24*  CREATININE 1.39*   < > 1.91* 1.92* 1.81*  CALCIUM 8.5*   < > 8.7* 8.6* 8.7*  PROT 8.9*  --   --   --   --  ALBUMIN 3.2*  --   --   --   --   AST 26  --   --   --   --   ALT 10  --   --   --   --   ALKPHOS 51  --   --   --   --   BILITOT 0.3  --   --   --   --   GFRNONAA 56*   < > 38* 38* 40*  ANIONGAP 3*   < > 7 6 7    < > = values in this interval not displayed.     Hematology Recent Labs  Lab 03/23/20 0234 03/24/20 0123 03/25/20 0410  WBC 5.7 5.6 7.0  RBC 2.81* 2.87* 3.03*  HGB 8.1* 8.3* 8.7*  HCT 26.5* 27.4* 28.5*  MCV 94.3 95.5 94.1  MCH 28.8 28.9 28.7  MCHC 30.6 30.3 30.5  RDW 15.1 15.0 14.8  PLT 238 240 286    Cardiac EnzymesNo results for input(s): TROPONINI in the last 168 hours. No results for input(s): TROPIPOC in the last 168 hours.   BNP Recent Labs  Lab 03/20/20 0119  BNP 213.0*     DDimer No results for input(s): DDIMER in the last 168 hours.   Radiology    No results found.  Cardiac Studies   Echocardiogram 03/20/2020: Impression: 1. Apical hypokinesis. . Left ventricular ejection fraction, by  estimation, is 40 to 45%. The left ventricle has mildly decreased  function. The left ventricle demonstrates regional wall motion  abnormalities (see scoring diagram/findings for  description).  There is mild left ventricular hypertrophy. Left ventricular  diastolic parameters are indeterminate.  2. Right ventricular systolic function is normal. The right ventricular  size is normal.  3. Left atrial size was mildly dilated.  4. The mitral valve is normal in structure. No evidence of mitral valve  regurgitation. No evidence of mitral stenosis.  5. The aortic valve has an indeterminant number of cusps. There is mild  calcification of the aortic valve. There is mild thickening of the aortic  valve. Aortic valve regurgitation is not visualized. No aortic stenosis is  present.  6. The inferior vena cava is normal in size with greater than 50%  respiratory variability, suggesting right atrial pressure of 3 mmHg.  _______________  Left Cardiac Catheterization 03/21/2020:  Mid LM to Dist LM lesion is 70% stenosed.  Ost LAD to Prox LAD lesion is 70% stenosed.  Ramus lesion is 70% stenosed.  Ost Cx to Mid Cx lesion is 99% stenosed.  Prox RCA lesion is 40% stenosed.  There is moderate left ventricular systolic dysfunction.  LV end diastolic pressure is severely elevated.  The left ventricular ejection fraction is 35-45% by visual estimate.  1. Severe complex CAD. There is a tapering 70% stenosis in the distal Left main involving as well the ostium of the LAD and ramus intermediate with hazy appearance. The LCx is a co-dominant vessel with extensive dissection involving the ostium and proximal vessel and is is subtotally occluded. 2. Moderate LV dysfunction. EF 40-45% 3. Severely elevated LVEDP 30 mm Hg.  Patient Profile     70 y.o. male with a history of PAD s/p left femoral-popliteal bypass which ultimately required left BKA in 08/2015 due to maggot infestation and right BKA, prior CVA, hypertension, hyperlipidemia, type 2 diabetes on insulin, CKD stage III, hepatitis C, and GERD who presented to [facility] with acute pulmonary edema and hypertensive urgency after  presenting with  dyspnea and reports of chest pain. He rule in for MI and was ultimately transferred to [facility] for further work-up.   Assessment & Plan    CAD:  Medical management.   Therapy is very limited   Imdur increased yesterday.    He should be on ASA if cleared by medicine.   Currently not on it because of anemia requiring transfusions.  I would not suggest DAPT but at least ASA if/when he is thought to not actively be bleeding.   ACUTE ON CHRONIC SYSTOLIC AND DIASTOLIC HF:   Net negative 5.9 liters.  Continue IV diuresis.  I am not suggesting further   HTN:   Imdur increased.    AKI ON CKD IIIA:    Coming down slightly.    For questions or updates, please contact [place] Please consult [email or web address] for contact info under Cardiology/STEMI.   Signed, Minus Breeding, MD  03/26/2020, 7:39 AM

## 2020-03-27 DIAGNOSIS — Z66 Do not resuscitate: Secondary | ICD-10-CM

## 2020-03-27 DIAGNOSIS — S88112A Complete traumatic amputation at level between knee and ankle, left lower leg, initial encounter: Secondary | ICD-10-CM | POA: Diagnosis not present

## 2020-03-27 DIAGNOSIS — I739 Peripheral vascular disease, unspecified: Secondary | ICD-10-CM

## 2020-03-27 DIAGNOSIS — I5041 Acute combined systolic (congestive) and diastolic (congestive) heart failure: Secondary | ICD-10-CM

## 2020-03-27 DIAGNOSIS — I214 Non-ST elevation (NSTEMI) myocardial infarction: Secondary | ICD-10-CM | POA: Diagnosis not present

## 2020-03-27 LAB — GLUCOSE, CAPILLARY
Glucose-Capillary: 223 mg/dL — ABNORMAL HIGH (ref 70–99)
Glucose-Capillary: 185 mg/dL — ABNORMAL HIGH (ref 70–99)
Glucose-Capillary: 164 mg/dL — ABNORMAL HIGH (ref 70–99)
Glucose-Capillary: 90 mg/dL (ref 70–99)
Glucose-Capillary: 243 mg/dL — ABNORMAL HIGH (ref 70–99)

## 2020-03-27 LAB — OCCULT BLOOD X 1 CARD TO LAB, STOOL: Fecal Occult Bld: POSITIVE — AB

## 2020-03-27 LAB — BASIC METABOLIC PANEL
Anion gap: 7 (ref 5–15)
BUN: 28 mg/dL — ABNORMAL HIGH (ref 8–23)
CO2: 34 mmol/L — ABNORMAL HIGH (ref 22–32)
Calcium: 8.8 mg/dL — ABNORMAL LOW (ref 8.9–10.3)
Chloride: 94 mmol/L — ABNORMAL LOW (ref 98–111)
Creatinine, Ser: 1.78 mg/dL — ABNORMAL HIGH (ref 0.61–1.24)
GFR, Estimated: 41 mL/min — ABNORMAL LOW (ref >60)
Glucose, Bld: 151 mg/dL — ABNORMAL HIGH (ref 70–99)
Potassium: 3.8 mmol/L (ref 3.5–5.1)
Sodium: 135 mmol/L (ref 135–145)

## 2020-03-27 LAB — CBC
HCT: 26.5 % — ABNORMAL LOW (ref 39.0–52.0)
Hemoglobin: 8 g/dL — ABNORMAL LOW (ref 13.0–17.0)
MCH: 28.6 pg (ref 26.0–34.0)
MCHC: 30.2 g/dL (ref 30.0–36.0)
MCV: 94.6 fL (ref 80.0–100.0)
Platelets: 261 10*3/uL (ref 150–400)
RBC: 2.8 MIL/uL — ABNORMAL LOW (ref 4.22–5.81)
RDW: 14.6 % (ref 11.5–15.5)
WBC: 6.8 10*3/uL (ref 4.0–10.5)
nRBC: 0 % (ref 0.0–0.2)

## 2020-03-27 NOTE — Progress Notes (Signed)
Progress Note  Patient Name: Cory Weber Date of Encounter: 03/27/2020  Primary Cardiologist:   No primary care provider on file.   Subjective   No chest pain, breathing better. Diuresed 800 cc negative - now 6.8L negative. Creatinine further improved at 1.78.  LVEF 40-45%, multivessel CAD, but not a PCI or CABG candidate.  Inpatient Medications    Scheduled Meds: . amLODipine  10 mg Oral Daily  . vitamin C  500 mg Oral BID  . aspirin EC  81 mg Oral Daily  . carvedilol  25 mg Oral BID WC  . DULoxetine  60 mg Oral Daily  . ferrous sulfate  325 mg Oral Q breakfast  . furosemide  80 mg Intravenous BID  . heparin  5,000 Units Subcutaneous Q8H  . hydrALAZINE  50 mg Oral Q8H  . insulin aspart  0-15 Units Subcutaneous TID WC  . insulin aspart  0-5 Units Subcutaneous QHS  . insulin aspart  8 Units Subcutaneous TID AC  . insulin glargine  30 Units Subcutaneous QHS  . isosorbide mononitrate  120 mg Oral Daily  . lidocaine  1 patch Transdermal Daily  . pantoprazole  40 mg Oral Daily  . polyethylene glycol  17 g Oral Daily  . pregabalin  75 mg Oral BID  . rosuvastatin  20 mg Oral Daily  . senna  1 tablet Oral Daily  . sodium chloride flush  3 mL Intravenous Q12H  . sodium chloride flush  3 mL Intravenous Q12H  . tamsulosin  0.4 mg Oral Daily  . cyanocobalamin  500 mcg Oral Daily   Continuous Infusions: . sodium chloride     PRN Meds: sodium chloride, acetaminophen, baclofen, ipratropium-albuterol, ondansetron (ZOFRAN) IV, sodium chloride flush   Vital Signs    Vitals:   03/26/20 1835 03/26/20 1923 03/27/20 0650 03/27/20 0839  BP: 134/88 (!) 143/53 129/76 (!) 128/91  Pulse: 98  81 86  Resp:  18 18   Temp:  98.9 F (37.2 C) 98.6 F (37 C)   TempSrc:  Oral Oral   SpO2:  98% 92%   Weight:   120.4 kg     Intake/Output Summary (Last 24 hours) at 03/27/2020 1004 Last data filed at 03/27/2020 0816 Gross per 24 hour  Intake 716 ml  Output 1550 ml  Net -834 ml    Filed Weights   03/26/20 0015 03/26/20 0423 03/27/20 0650  Weight: 121.7 kg 121.7 kg 120.4 kg    Telemetry    NSR - Personally Reviewed  ECG    NA - Personally Reviewed  Physical Exam   General appearance: alert and no distress Neck: no carotid bruit, no JVD and thyroid not enlarged, symmetric, no tenderness/mass/nodules Lungs: diminished breath sounds bilaterally Heart: regular rate and rhythm Abdomen: soft, non-tender; bowel sounds normal; no masses,  no organomegaly and obese Extremities: edema trace stump edema, s/p bilateral BKA's Pulses: 2+ and symmetric Skin: Skin color, texture, turgor normal. No rashes or lesions Neurologic: Grossly normal Psych: Pleasant   Labs    Chemistry Recent Labs  Lab 03/25/20 0410 03/26/20 0326 03/27/20 0220  NA 135 136 135  K 3.6 3.6 3.8  CL 94* 94* 94*  CO2 35* 35* 34*  GLUCOSE 277* 191* 151*  BUN 21 24* 28*  CREATININE 1.92* 1.81* 1.78*  CALCIUM 8.6* 8.7* 8.8*  GFRNONAA 38* 40* 41*  ANIONGAP 6 7 7      Hematology Recent Labs  Lab 03/24/20 0123 03/25/20 0410 03/27/20 0220  WBC  5.6 7.0 6.8  RBC 2.87* 3.03* 2.80*  HGB 8.3* 8.7* 8.0*  HCT 27.4* 28.5* 26.5*  MCV 95.5 94.1 94.6  MCH 28.9 28.7 28.6  MCHC 30.3 30.5 30.2  RDW 15.0 14.8 14.6  PLT 240 286 261    Cardiac EnzymesNo results for input(s): TROPONINI in the last 168 hours. No results for input(s): TROPIPOC in the last 168 hours.   BNP No results for input(s): BNP, PROBNP in the last 168 hours.   DDimer No results for input(s): DDIMER in the last 168 hours.   Radiology    No results found.  Cardiac Studies   Echocardiogram 03/20/2020: Impression: 1. Apical hypokinesis. . Left ventricular ejection fraction, by  estimation, is 40 to 45%. The left ventricle has mildly decreased  function. The left ventricle demonstrates regional wall motion  abnormalities (see scoring diagram/findings for  description). There is mild left ventricular hypertrophy. Left  ventricular  diastolic parameters are indeterminate.  2. Right ventricular systolic function is normal. The right ventricular  size is normal.  3. Left atrial size was mildly dilated.  4. The mitral valve is normal in structure. No evidence of mitral valve  regurgitation. No evidence of mitral stenosis.  5. The aortic valve has an indeterminant number of cusps. There is mild  calcification of the aortic valve. There is mild thickening of the aortic  valve. Aortic valve regurgitation is not visualized. No aortic stenosis is  present.  6. The inferior vena cava is normal in size with greater than 50%  respiratory variability, suggesting right atrial pressure of 3 mmHg.  _______________  Left Cardiac Catheterization 03/21/2020:  Mid LM to Dist LM lesion is 70% stenosed.  Ost LAD to Prox LAD lesion is 70% stenosed.  Ramus lesion is 70% stenosed.  Ost Cx to Mid Cx lesion is 99% stenosed.  Prox RCA lesion is 40% stenosed.  There is moderate left ventricular systolic dysfunction.  LV end diastolic pressure is severely elevated.  The left ventricular ejection fraction is 35-45% by visual estimate.  1. Severe complex CAD. There is a tapering 70% stenosis in the distal Left main involving as well the ostium of the LAD and ramus intermediate with hazy appearance. The LCx is a co-dominant vessel with extensive dissection involving the ostium and proximal vessel and is is subtotally occluded. 2. Moderate LV dysfunction. EF 40-45% 3. Severely elevated LVEDP 30 mm Hg.  Patient Profile     70 y.o. male with a history of PAD s/p left femoral-popliteal bypass which ultimately required left BKA in 08/2015 due to maggot infestation and right BKA, prior CVA, hypertension, hyperlipidemia, type 2 diabetes on insulin, CKD stage III, hepatitis C, and GERD who presented to [facility] with acute pulmonary edema and hypertensive urgency after presenting with dyspnea and reports of chest pain.  He rule in for MI and was ultimately transferred to [facility] for further work-up.  Assessment & Plan    CAD:  Medical management.  Prognosis thought to be poor. Hemoglobin has been stable and platelets are normal. Would continue aspirin 81 mg daily. Imdur for angina benefit and CHF. Crestor 20 mg daily.  ACUTE ON CHRONIC SYSTOLIC AND DIASTOLIC HF:   Net negative 6.8 liters.  Continue IV diuresis. On imdur/hydralazine/coreg/amlodipine.   HTN:   BP appears controlled on current regimen.  AKI ON CKD IIIA:    Improving with diuresis, continue  For questions or updates, please contact [place] Please consult [email or web address] for contact info under Cardiology/STEMI.  Pixie Casino, MD, [facility], [place] Director of the Advanced Lipid Disorders &  Cardiovascular Risk Reduction Clinic Diplomate of the American Board of Clinical Lipidology Attending Cardiologist  Direct Dial: [phone]  Fax: [phone]  Website:  [email or web address]  Pixie Casino, MD  03/27/2020, 10:04 AM

## 2020-03-27 NOTE — Progress Notes (Addendum)
PROGRESS NOTE    Cory Weber  MWN:[id number] DOB: Jul 09, 1950 DOA: 03/20/2020 PCP: Iona Beard, MD   Brief Narrative: 70 year old with past medical history significant for CKD, CVA, hepatitis C, bilateral BKA, hyperlipidemia, BPH, GERD, anemia, asthma who presents complaining of acute onset shortness of breath.  Patient is oxygen dependent and normally wears 2 L of oxygen at baseline.  His pulse ox dropped into the 70s and he was placed on nonrebreather when he arrived.  He has not had cough or fever.  He had Covid 2 months ago.  He denies chest pain, fever, diarrhea.  He does report that he has had occasional black stool over the last month.  He reports shortness of breath when lying flat.  He did have chest pain several days prior to admission.  He lives at a skilled nursing facility.  Patient was found to have acute hypoxic respiratory failure secondary to heart failure exacerbation and non-STEMI.  Patient was transferred from [facility] to [facility] for cardiac catheterization. Cardiac cath performed 2/8 show:  Severe complex CAD. There is a tapering 70% stenosis in the distal Left main involving as well the ostium of the LAD and ramus intermediate with hazy appearance. The LCx is a co-dominant vessel with extensive dissection involving the ostium and proximal vessel and is is subtotally occluded. Moderate LV dysfunction. EF 40-45%. Severely elevated LVEDP 30 mm Hg.    Assessment & Plan:   Principal Problem:   NSTEMI (non-ST elevated myocardial infarction) ([place]) Active Problems:   Polysubstance abuse ([place])   Tobacco abuse   Melena   Chronic kidney disease, stage 3 (HCC)   Essential hypertension   Chronic hepatitis C without hepatic coma (HCC)   History of CVA (cerebrovascular accident) without residual deficits   Phantom limb pain (HCC)   Anemia of chronic disease   PVD (peripheral vascular disease) (HCC)   Amputation of left lower extremity below knee (HCC)   GERD  (gastroesophageal reflux disease)   Edema of right lower extremity   Uncontrolled type II diabetes with peripheral autonomic neuropathy (HCC)   Dyslipidemia associated with type 2 diabetes mellitus (HCC)   Physical deconditioning   Anemia, chronic disease   Chronic diastolic CHF (congestive heart failure) ([place])   PAD (peripheral artery disease) ([place])   DNR (do not resuscitate)   Heart failure ([place])   1-Non-STEMI: -Patient was treated with heparin drip and nitroglycerin drip.  Transition to Imdur.  -Troponin peaked at 12,625. -Cardiology consulted, patient underwent cardiac catheterization 12/8 which showed: Severe complex CAD. There is a tapering 70% stenosis in the distal Left main involving as well the ostium of the LAD and ramus intermediate with hazy appearance. The LCx is a co-dominant vessel with extensive dissection involving the ostium and proximal vessel and is is subtotally occluded. -Medical management.  No acute option for PCI, per cardiology patient is not a good candidate for CABG. -imdur increase to 120 mg daily today.  -Continue with Norvasc.  -Already on aspirin/ occult blood positive but hb has remain stable.   2-Acute systolic/diastolic heart failure exacerbation with acute hypoxic respiratory failure and pulmonary edema: -Patient presented with oxygen saturation in the 70s, initially placed on nonrebreather, currently on 3 L. -Chest x-ray show pulmonary edema, BNP 213.  Echo ejection fraction 40 to 45%. -Started on IV lasix 2/09. -Continue with IV lasix.  Negative 7 L -Monitor renal function.  -Continue with lasix IV>  -Cr stable.   3-Hypertensive urgency: Treated initially with nitroglycerin  drip.  Continue with  Hydralazine, Norvasc, coreg.  BP better controlled.   4-Hyperlipidemia: Continue with statin 5-history of CVA: Continue with aspirin statin  plavix held due to anemia.   GERD: Continue with PPI  Hepatitis C: May follow-up as an  outpatient  Diabetes type 2 with nephropathy: Continue with Lantus and sliding scale insulin.  Anemia of chronic disease: Hemoglobin baseline 9 Received 1 unit packed red blood cell this admission.  Hb stable at 8. Repeat labs in am.  Occult blood positive but hd stable  Peripheral vascular disease: Status post left BKA 2017, right BKA 2018  Morbid obesity BMI 42 Need lifestyle modification  Chronic kidney disease a stage IIIa: Continue to monitor Cr increase to 1. 7---1.9--1.9--1.8--1.7 Monitor closely on lasix.    Dysuria;  Check UA  Estimated body mass index is 41.57 kg/m as calculated from the following:   Height as of 10/23/19: 5\' 7"  (1.702 m).   Weight as of this encounter: 120.4 kg.   DVT prophylaxis: Heparin  Code Status: DNR Family Communication: Daughter over phone 2/11 Disposition Plan:  Status is: Inpatient  Remains inpatient appropriate because:Ongoing active pain requiring inpatient pain management   Dispo: The patient is from: Home              Anticipated d/c is to: Home              Anticipated d/c date is: 3 days              Patient currently is not medically stable to d/c.   Difficult to place patient No        Consultants:   Cardiology   Procedures:    CATH; Mid LM to Dist LM lesion is 70% stenosed.  Ost LAD to Prox LAD lesion is 70% stenosed.  Ramus lesion is 70% stenosed.  Ost Cx to Mid Cx lesion is 99% stenosed.  Prox RCA lesion is 40% stenosed.  There is moderate left ventricular systolic dysfunction.  LV end diastolic pressure is severely elevated.  The left ventricular ejection fraction is 35-45% by visual estimate.   1. Severe complex CAD. There is a tapering 70% stenosis in the distal Left main involving as well the ostium of the LAD and ramus intermediate with hazy appearance. The LCx is a co-dominant vessel with extensive dissection involving the ostium and proximal vessel and is is subtotally occluded. 2.  Moderate LV dysfunction. EF 40-45% 3. Severely elevated LVEDP 30 mm Hg.   Antimicrobials:    Subjective: He is breathing better. Denies chest pain   Objective: Vitals:   03/26/20 1923 03/27/20 0650 03/27/20 0839 03/27/20 1053  BP: (!) 143/53 129/76 (!) 128/91 109/72  Pulse:  81 86 78  Resp: 18 18  20   Temp: 98.9 F (37.2 C) 98.6 F (37 C)  98.7 F (37.1 C)  TempSrc: Oral Oral  Oral  SpO2: 98% 92%  93%  Weight:  120.4 kg      Intake/Output Summary (Last 24 hours) at 03/27/2020 1518 Last data filed at 03/27/2020 1041 Gross per 24 hour  Intake 716 ml  Output 2050 ml  Net -1334 ml   Filed Weights   03/26/20 0015 03/26/20 0423 03/27/20 0650  Weight: 121.7 kg 121.7 kg 120.4 kg    Examination:  General exam: NAD Respiratory system: Crackles BL Cardiovascular system: S 1, S 2 RRR Gastrointestinal system: BS present, soft, nt  Central nervous system: Alert Extremities: B/L BKA    Data Reviewed:  I have personally reviewed following labs and imaging studies  CBC: Recent Labs  Lab 03/22/20 0243 03/23/20 0234 03/24/20 0123 03/25/20 0410 03/27/20 0220  WBC 6.7 5.7 5.6 7.0 6.8  HGB 8.8* 8.1* 8.3* 8.7* 8.0*  HCT 28.6* 26.5* 27.4* 28.5* 26.5*  MCV 94.1 94.3 95.5 94.1 94.6  PLT 250 238 240 286 382   Basic Metabolic Panel: Recent Labs  Lab 03/21/20 0414 03/21/20 0901 03/22/20 0243 03/23/20 0234 03/24/20 0123 03/25/20 0410 03/26/20 0326 03/27/20 0220  NA 135  --  136 135 133* 135 136 135  K 3.6  --  4.3 3.6 3.5 3.6 3.6 3.8  CL 95*  --  95* 92* 92* 94* 94* 94*  CO2 33*  --  34* 34* 34* 35* 35* 34*  GLUCOSE 140*  --  207* 261* 292* 277* 191* 151*  BUN 14  --  14 18 22 21  24* 28*  CREATININE 1.33*   < > 1.45* 1.77* 1.91* 1.92* 1.81* 1.78*  CALCIUM 8.6*  --  8.8* 8.9 8.7* 8.6* 8.7* 8.8*  MG 2.0  --  2.1 2.3 2.3 2.2  --   --    < > = values in this interval not displayed.   GFR: Estimated Creatinine Clearance: 50 mL/min (A) (by C-G formula based on SCr of  1.78 mg/dL (H)). Liver Function Tests: No results for input(s): AST, ALT, ALKPHOS, BILITOT, PROT, ALBUMIN in the last 168 hours. No results for input(s): LIPASE, AMYLASE in the last 168 hours. No results for input(s): AMMONIA in the last 168 hours. Coagulation Profile: No results for input(s): INR, PROTIME in the last 168 hours. Cardiac Enzymes: No results for input(s): CKTOTAL, CKMB, CKMBINDEX, TROPONINI in the last 168 hours. BNP (last 3 results) No results for input(s): PROBNP in the last 8760 hours. HbA1C: No results for input(s): HGBA1C in the last 72 hours. CBG: Recent Labs  Lab 03/26/20 1520 03/26/20 2136 03/27/20 0439 03/27/20 0606 03/27/20 1054  GLUCAP 133* 237* 223* 185* 164*   Lipid Profile: No results for input(s): CHOL, HDL, LDLCALC, TRIG, CHOLHDL, LDLDIRECT in the last 72 hours. Thyroid Function Tests: No results for input(s): TSH, T4TOTAL, FREET4, T3FREE, THYROIDAB in the last 72 hours. Anemia Panel: No results for input(s): VITAMINB12, FOLATE, FERRITIN, TIBC, IRON, RETICCTPCT in the last 72 hours. Sepsis Labs: No results for input(s): PROCALCITON, LATICACIDVEN in the last 168 hours.  Recent Results (from the past 240 hour(s))  Culture, blood (Routine x 2)     Status: None   Collection Time: 03/20/20  1:15 AM   Specimen: BLOOD  Result Value Ref Range Status   Specimen Description BLOOD BLOOD LEFT HAND  Final   Special Requests   Final    BOTTLES DRAWN AEROBIC AND ANAEROBIC Blood Culture adequate volume   Culture   Final    NO GROWTH 5 DAYS Performed at [facility], [address] [place], [place] [address]    Report Status 03/25/2020 FINAL  Final  Culture, blood (Routine x 2)     Status: None   Collection Time: 03/20/20  1:19 AM   Specimen: BLOOD  Result Value Ref Range Status   Specimen Description BLOOD SITE NOT SPECIFIED  Final   Special Requests   Final    BOTTLES DRAWN AEROBIC AND ANAEROBIC Blood Culture adequate volume   Culture   Final    NO  GROWTH 5 DAYS Performed at [facility], [address], [place], [place] [address]    Report Status 03/25/2020 FINAL  Final  SARS Coronavirus 2 by RT PCR (hospital order, performed in [facility] hospital lab) Nasopharyngeal Nasopharyngeal Swab     Status: None   Collection Time: 03/20/20  1:20 AM   Specimen: Nasopharyngeal Swab  Result Value Ref Range Status   SARS Coronavirus 2 NEGATIVE NEGATIVE Final    Comment: (NOTE) SARS-CoV-2 target nucleic acids are NOT DETECTED.  The SARS-CoV-2 RNA is generally detectable in upper and lower respiratory specimens during the acute phase of infection. The lowest concentration of SARS-CoV-2 viral copies this assay can detect is 250 copies / mL. A negative result does not preclude SARS-CoV-2 infection and should not be used as the sole basis for treatment or other patient management decisions.  A negative result may occur with improper specimen collection / handling, submission of specimen other than nasopharyngeal swab, presence of viral mutation(s) within the areas targeted by this assay, and inadequate number of viral copies (<250 copies / mL). A negative result must be combined with clinical observations, patient history, and epidemiological information.  Fact Sheet for Patients:   StrictlyIdeas.no  Fact Sheet for Healthcare Providers: BankingDealers.co.za  This test is not yet approved or  cleared by the [place] FDA and has been authorized for detection and/or diagnosis of SARS-CoV-2 by FDA under an Emergency Use Authorization (EUA).  This EUA will remain in effect (meaning this test can be used) for the duration of the COVID-19 declaration under Section 564(b)(1) of the Act, 21 U.S.C. section 360bbb-3(b)(1), unless the authorization is terminated or revoked sooner.  Performed at [facility], [address], [place], [place] [address]   MRSA PCR Screening     Status: Abnormal    Collection Time: 03/21/20  6:59 AM   Specimen: Nasal Mucosa; Nasopharyngeal  Result Value Ref Range Status   MRSA by PCR POSITIVE (A) NEGATIVE Final    Comment:        The GeneXpert MRSA Assay (FDA approved for NASAL specimens only), is one component of a comprehensive MRSA colonization surveillance program. It is not intended to diagnose MRSA infection nor to guide or monitor treatment for MRSA infections. RESULT CALLED TO, READ BACK BY AND VERIFIED WITH: RN L.KING AT 2094 ON 03/21/2020 BY T.SAAD Performed at [facility] [address] [place], [place] [address]          Radiology Studies: No results found.      Scheduled Meds: . amLODipine  10 mg Oral Daily  . vitamin C  500 mg Oral BID  . aspirin EC  81 mg Oral Daily  . carvedilol  25 mg Oral BID WC  . DULoxetine  60 mg Oral Daily  . ferrous sulfate  325 mg Oral Q breakfast  . furosemide  80 mg Intravenous BID  . heparin  5,000 Units Subcutaneous Q8H  . hydrALAZINE  50 mg Oral Q8H  . insulin aspart  0-15 Units Subcutaneous TID WC  . insulin aspart  0-5 Units Subcutaneous QHS  . insulin aspart  8 Units Subcutaneous TID AC  . insulin glargine  30 Units Subcutaneous QHS  . isosorbide mononitrate  120 mg Oral Daily  . lidocaine  1 patch Transdermal Daily  . pantoprazole  40 mg Oral Daily  . polyethylene glycol  17 g Oral Daily  . pregabalin  75 mg Oral BID  . rosuvastatin  20 mg Oral Daily  . senna  1 tablet Oral Daily  . sodium chloride flush  3 mL Intravenous Q12H  . sodium  chloride flush  3 mL Intravenous Q12H  . tamsulosin  0.4 mg Oral Daily  . cyanocobalamin  500 mcg Oral Daily   Continuous Infusions: . sodium chloride       LOS: 7 days    Time spent: 35 minutes.     Elmarie Shiley, MD [facility]   If 7PM-7AM, please contact night-coverage [email or web address]  03/27/2020, 3:18 PM

## 2020-03-27 NOTE — Care Management Important Message (Signed)
Important Message  Patient Details  Name: Cory Weber MRN: [id number] Date of Birth: 1950-09-26   Medicare Important Message Given:  Yes     Shelda Altes 03/27/2020, 9:12 AM

## 2020-03-28 DIAGNOSIS — I214 Non-ST elevation (NSTEMI) myocardial infarction: Secondary | ICD-10-CM | POA: Diagnosis not present

## 2020-03-28 DIAGNOSIS — I739 Peripheral vascular disease, unspecified: Secondary | ICD-10-CM | POA: Diagnosis not present

## 2020-03-28 DIAGNOSIS — I5041 Acute combined systolic (congestive) and diastolic (congestive) heart failure: Secondary | ICD-10-CM | POA: Diagnosis not present

## 2020-03-28 LAB — URINALYSIS, ROUTINE W REFLEX MICROSCOPIC
Bacteria, UA: NONE SEEN
Bilirubin Urine: NEGATIVE
Glucose, UA: NEGATIVE mg/dL
Hgb urine dipstick: NEGATIVE
Ketones, ur: NEGATIVE mg/dL
Nitrite: NEGATIVE
Protein, ur: NEGATIVE mg/dL
Specific Gravity, Urine: 1.01 (ref 1.005–1.030)
pH: 5 (ref 5.0–8.0)

## 2020-03-28 LAB — GLUCOSE, CAPILLARY
Glucose-Capillary: 166 mg/dL — ABNORMAL HIGH (ref 70–99)
Glucose-Capillary: 235 mg/dL — ABNORMAL HIGH (ref 70–99)
Glucose-Capillary: 223 mg/dL — ABNORMAL HIGH (ref 70–99)
Glucose-Capillary: 136 mg/dL — ABNORMAL HIGH (ref 70–99)
Glucose-Capillary: 221 mg/dL — ABNORMAL HIGH (ref 70–99)

## 2020-03-28 LAB — BASIC METABOLIC PANEL
Anion gap: 7 (ref 5–15)
BUN: 33 mg/dL — ABNORMAL HIGH (ref 8–23)
CO2: 35 mmol/L — ABNORMAL HIGH (ref 22–32)
Calcium: 8.7 mg/dL — ABNORMAL LOW (ref 8.9–10.3)
Chloride: 92 mmol/L — ABNORMAL LOW (ref 98–111)
Creatinine, Ser: 1.83 mg/dL — ABNORMAL HIGH (ref 0.61–1.24)
GFR, Estimated: 40 mL/min — ABNORMAL LOW (ref >60)
Glucose, Bld: 211 mg/dL — ABNORMAL HIGH (ref 70–99)
Potassium: 3.9 mmol/L (ref 3.5–5.1)
Sodium: 134 mmol/L — ABNORMAL LOW (ref 135–145)

## 2020-03-28 LAB — CBC
HCT: 24.6 % — ABNORMAL LOW (ref 39.0–52.0)
Hemoglobin: 7.7 g/dL — ABNORMAL LOW (ref 13.0–17.0)
MCH: 29.1 pg (ref 26.0–34.0)
MCHC: 31.3 g/dL (ref 30.0–36.0)
MCV: 92.8 fL (ref 80.0–100.0)
Platelets: 284 10*3/uL (ref 150–400)
RBC: 2.65 MIL/uL — ABNORMAL LOW (ref 4.22–5.81)
RDW: 15.1 % (ref 11.5–15.5)
WBC: 6.7 10*3/uL (ref 4.0–10.5)
nRBC: 0 % (ref 0.0–0.2)

## 2020-03-28 MED ORDER — FUROSEMIDE 80 MG PO TABS
80.0000 mg | ORAL_TABLET | Freq: Every day | ORAL | Status: DC
  Administered 2020-03-29 – 2020-03-30 (×2): 80 mg via ORAL
  Filled 2020-03-28 (×2): qty 1

## 2020-03-28 NOTE — TOC Progression Note (Addendum)
Transition of Care [facility]) - Progression Note    Patient Details  Name: Cory Weber MRN: [id number] Date of Birth: April 01, 1950  Transition of Care [facility] CM/SW [place], [place] Phone Number: 03/28/2020, 1:50 PM  Clinical Narrative:     CSW called Debbie from [facility]; voicemail box is full. CSW texted Debbie to inform of pt's possible d/c tomorrow.   1430: Debbie responded and informed CSW that pt can return tomorrow. She stated pt does not need a new covid test as they will test him when he returns.   Expected Discharge Plan: Long Term Nursing Home Barriers to Discharge: Continued Medical Work up  Expected Discharge Plan and Services Expected Discharge Plan: [place] In-house Referral: Clinical Social Work Discharge Planning Services: NA Post Acute Care Choice: Nursing Home Living arrangements for the past 2 months: [place]                 DME Arranged: N/A DME Agency: NA       HH Arranged: NA HH Agency: NA         Social Determinants of Health (SDOH) Interventions    Readmission Risk Interventions Readmission Risk Prevention Plan 03/20/2020  Transportation Screening Complete  PCP or Specialist Appt within 3-5 Days Not Complete  Not Complete comments Patient resides at [facility] and the facility schedules hospital follow up appointments.  [place] or Home Care Consult Complete  Social Work Consult for [place] Planning/Counseling Complete  Palliative Care Screening Not Applicable  Medication Review Press photographer) Complete  Some recent data might be hidden

## 2020-03-28 NOTE — Progress Notes (Signed)
Physical Therapy Treatment Patient Details Name: Cory Weber MRN: [id number] DOB: [birth year] Today's Date: 03/28/2020    History of Present Illness Pt adm with acute hypoxic respiratory failure secondary to heart failure exacerbation and NSTEMI. Cardiac cath 2/8 severe complex CAD. Pt not candidate for CABG or PCI. PMH - bil BKA 2017/2018, ckd, cva, hep C, asthma with 2L O2 [place] baseline.    PT Comments    Pt received in supine, agreeable to therapy session and with good participation and tolerance for mobility. Pt able to demonstrate bed to wheelchair transfers using slide board with up to +2minA, and continues to needs minA for bed mobility. Pt significantly fatigued after x2 slide board transfers and propelling wheelchair household distance, reports 6-7/10 modified RPE, VSS during session on 2L O2 [place]. Pt denies chest pain, reports minimal hand pain only during transfers. Will plan to progress wheelchair mobility distance for BUE strengthening next session vs transfer to chair if drop arm chair available in room next session. Pt continues to benefit from PT services to progress toward functional mobility goals. DC recs below remain appropriate.   Follow Up Recommendations  Other (comment) (return to long term care with PT)     Equipment Recommendations  None recommended by PT    Recommendations for Other Services       Precautions / Restrictions Precautions Precautions: Fall Restrictions Weight Bearing Restrictions: No    Mobility  Bed Mobility Overal bed mobility: Needs Assistance Bed Mobility: Supine to Sit;Sit to Supine     Supine to sit: Min assist Sit to supine: Min assist   General bed mobility comments: Assist to elevate trunk into sitting with cues for self-assist with bed rails    Transfers Overall transfer level: Needs assistance   Transfers: Lateral/Scoot Transfers          Lateral/Scoot Transfers: Min assist;+2 safety/equipment;With slide  board General transfer comment: from EOB>WC to L side, cues for leaning to side for SB insertion and needs +31minA for sliding to WC and +2 min guard for sliding back from WC to EOB, pt w/o complaint of arm/hand pain during PT session  Ambulation/Gait                 Theme park manager mobility: Yes Wheelchair propulsion: Both upper extremities Wheelchair parts: Supervision/cueing Distance: 48 Wheelchair Assistance Details (indicate cue type and reason): pt Supervision for directional navigation, occasional cues for line awareness/minding fingers through narrow doorway with wide WC but otherwise good technique for forward/backward/turning navigation and pt able to use brakes but needs reminder to set them/for some safety  Modified Rankin (Stroke Patients Only)       Balance Overall balance assessment: Needs assistance Sitting-balance support: No upper extremity supported;Feet unsupported Sitting balance-Leahy Scale: Fair Sitting balance - Comments: 1-2 UE support for seated balance at EOB, +2 min guard to minA during seated scooting for balance                                    Cognition Arousal/Alertness: Awake/alert Behavior During Therapy: WFL for tasks assessed/performed Overall Cognitive Status: Within Functional Limits for tasks assessed                                 General Comments: good  following of 1-step commands, fair following of 2-step commands.      Exercises Other Exercises Other Exercises: Pt given HEP handout, encouraged glute sets, hip extension, hip abduction, hip flexion, quad sets 3x10 reps daily.    General Comments General comments (skin integrity, edema, etc.): HR to 95 bpm during mobility tasks, SpO2 WNL on 2L O2 [place] during mobility and no r/o dizziness with transfers, BP not otherwise assessed      Pertinent Vitals/Pain Pain Assessment: Faces Faces  Pain Scale: Hurts a little bit Pain Location: B hands with slide board transfers Pain Descriptors / Indicators: Discomfort Pain Intervention(s): Monitored during session;Repositioned    Home Living                      Prior Function            PT Goals (current goals can now be found in the care plan section) Acute Rehab PT Goals Patient Stated Goal: feel better PT Goal Formulation: With patient Time For Goal Achievement: 04/06/20 Potential to Achieve Goals: Good Progress towards PT goals: Progressing toward goals    Frequency    Min 2X/week      PT Plan Current plan remains appropriate    Co-evaluation              AM-PAC PT "6 Clicks" Mobility   Outcome Measure  Help needed turning from your back to your side while in a flat bed without using bedrails?: A Little Help needed moving from lying on your back to sitting on the side of a flat bed without using bedrails?: A Little Help needed moving to and from a bed to a chair (including a wheelchair)?: A Little Help needed standing up from a chair using your arms (e.g., wheelchair or bedside chair)?: Total Help needed to walk in hospital room?: Total Help needed climbing 3-5 steps with a railing? : Total 6 Click Score: 12    End of Session Equipment Utilized During Treatment: Oxygen Activity Tolerance: Patient tolerated treatment well Patient left: in bed;with call bell/phone within reach;with bed alarm set Nurse Communication: Mobility status;Other (comment) (pt needs drop arm recliner in room) PT Visit Diagnosis: Other abnormalities of gait and mobility (R26.89);Muscle weakness (generalized) (M62.81)     Time: 9528-4132 PT Time Calculation (min) (ACUTE ONLY): 31 min  Charges:  $Therapeutic Exercise: 8-22 mins $Therapeutic Activity: 8-22 mins                     Vennie Waymire P., PTA Acute Rehabilitation Services Pager: [phone] Office: [place] 03/28/2020, 3:32 PM

## 2020-03-28 NOTE — Progress Notes (Signed)
Inpatient Diabetes Program Recommendations  AACE/ADA: New Consensus Statement on Inpatient Glycemic Control (2015)  Target Ranges:  Prepandial:   less than 140 mg/dL      Peak postprandial:   less than 180 mg/dL (1-2 hours)      Critically ill patients:  140 - 180 mg/dL   Results for [name], [name] (MRN [id number]) as of 03/28/2020 13:44  Ref. Range 03/27/2020 06:06 03/27/2020 10:54 03/27/2020 17:04 03/27/2020 21:24  Glucose-Capillary Latest Ref Range: 70 - 99 mg/dL 185 (H)  11 units NOVOLOG  164 (H)  11 units NOVOLOG  90 243 (H)  2 units NOVOLOG  30 units LANTUS   Results for [name], [name] (MRN [id number]) as of 03/28/2020 13:44  Ref. Range 03/28/2020 06:03 03/28/2020 11:53  Glucose-Capillary Latest Ref Range: 70 - 99 mg/dL 235 (H)  13 units NOVOLOG  223 (H)  13 units NOVOLOG    Home DM Meds: Lantus 30 units QHS       Novolog 8 units TID       Metformin 850 mg BID  Current Orders: Lantus 30 units QHS       Novolog 0-15 units ac/hs       Novolog 8 units TID with meals    MD- Note CBGs >200 today.  If this trend contnues, may consider the following:  1. Increase Lantus slightly to 32 units QHS  2. Increase Novolog Meal Coverage to 10 units TID with meals    --Will follow patient during hospitalization--  Wyn Quaker RN, MSN, CDE Diabetes Coordinator Inpatient Glycemic Control Team Team Pager: [phone] (8a-5p)

## 2020-03-28 NOTE — Progress Notes (Signed)
UA collected & sent to lab.

## 2020-03-28 NOTE — Progress Notes (Addendum)
UA was sent to lab and resulted today at 1328. MD made aware.

## 2020-03-28 NOTE — Progress Notes (Signed)
Progress Note  Patient Name: Cory Weber Date of Encounter: 03/28/2020  Primary Cardiologist:   No primary care provider on file.   Subjective   Diuresed another 1.2L negative yesterday - small increase in creatinine today at 1.83. BUN up to 33 (from 28) - may be at end-diuresis.  Inpatient Medications    Scheduled Meds: . amLODipine  10 mg Oral Daily  . vitamin C  500 mg Oral BID  . aspirin EC  81 mg Oral Daily  . carvedilol  25 mg Oral BID WC  . DULoxetine  60 mg Oral Daily  . ferrous sulfate  325 mg Oral Q breakfast  . furosemide  80 mg Intravenous BID  . heparin  5,000 Units Subcutaneous Q8H  . hydrALAZINE  50 mg Oral Q8H  . insulin aspart  0-15 Units Subcutaneous TID WC  . insulin aspart  0-5 Units Subcutaneous QHS  . insulin aspart  8 Units Subcutaneous TID AC  . insulin glargine  30 Units Subcutaneous QHS  . isosorbide mononitrate  120 mg Oral Daily  . lidocaine  1 patch Transdermal Daily  . pantoprazole  40 mg Oral Daily  . polyethylene glycol  17 g Oral Daily  . pregabalin  75 mg Oral BID  . rosuvastatin  20 mg Oral Daily  . senna  1 tablet Oral Daily  . sodium chloride flush  3 mL Intravenous Q12H  . sodium chloride flush  3 mL Intravenous Q12H  . tamsulosin  0.4 mg Oral Daily  . cyanocobalamin  500 mcg Oral Daily   Continuous Infusions: . sodium chloride     PRN Meds: sodium chloride, acetaminophen, baclofen, ipratropium-albuterol, ondansetron (ZOFRAN) IV, sodium chloride flush   Vital Signs    Vitals:   03/28/20 0319 03/28/20 0400 03/28/20 0847 03/28/20 0910  BP: 121/71 114/75  131/87  Pulse: 85  92 92  Resp: (!) 21 16    Temp: 98.2 F (36.8 C)  98.9 F (37.2 C)   TempSrc: Oral  Oral   SpO2: 98%  99%   Weight: 120.3 kg       Intake/Output Summary (Last 24 hours) at 03/28/2020 0915 Last data filed at 03/28/2020 0836 Gross per 24 hour  Intake 957 ml  Output 2550 ml  Net -1593 ml   Filed Weights   03/26/20 0423 03/27/20 0650 03/28/20  0319  Weight: 121.7 kg 120.4 kg 120.3 kg    Telemetry    NSR - Personally Reviewed  ECG    NA - Personally Reviewed  Physical Exam   General appearance: alert and no distress Neck: no carotid bruit, no JVD and thyroid not enlarged, symmetric, no tenderness/mass/nodules Lungs: diminished breath sounds bilaterally Heart: regular rate and rhythm Abdomen: soft, non-tender; bowel sounds normal; no masses,  no organomegaly and obese Extremities: edema trace stump edema, s/p bilateral BKA's Pulses: 2+ and symmetric Skin: Skin color, texture, turgor normal. No rashes or lesions Neurologic: Grossly normal Psych: Pleasant   Labs    Chemistry Recent Labs  Lab 03/26/20 0326 03/27/20 0220 03/28/20 0218  NA 136 135 134*  K 3.6 3.8 3.9  CL 94* 94* 92*  CO2 35* 34* 35*  GLUCOSE 191* 151* 211*  BUN 24* 28* 33*  CREATININE 1.81* 1.78* 1.83*  CALCIUM 8.7* 8.8* 8.7*  GFRNONAA 40* 41* 40*  ANIONGAP 7 7 7      Hematology Recent Labs  Lab 03/24/20 0123 03/25/20 0410 03/27/20 0220  WBC 5.6 7.0 6.8  RBC 2.87* 3.03*  2.80*  HGB 8.3* 8.7* 8.0*  HCT 27.4* 28.5* 26.5*  MCV 95.5 94.1 94.6  MCH 28.9 28.7 28.6  MCHC 30.3 30.5 30.2  RDW 15.0 14.8 14.6  PLT 240 286 261    Cardiac EnzymesNo results for input(s): TROPONINI in the last 168 hours. No results for input(s): TROPIPOC in the last 168 hours.   BNP No results for input(s): BNP, PROBNP in the last 168 hours.   DDimer No results for input(s): DDIMER in the last 168 hours.   Radiology    No results found.  Cardiac Studies   Echocardiogram 03/20/2020: Impression: 1. Apical hypokinesis. . Left ventricular ejection fraction, by  estimation, is 40 to 45%. The left ventricle has mildly decreased  function. The left ventricle demonstrates regional wall motion  abnormalities (see scoring diagram/findings for  description). There is mild left ventricular hypertrophy. Left ventricular  diastolic parameters are indeterminate.   2. Right ventricular systolic function is normal. The right ventricular  size is normal.  3. Left atrial size was mildly dilated.  4. The mitral valve is normal in structure. No evidence of mitral valve  regurgitation. No evidence of mitral stenosis.  5. The aortic valve has an indeterminant number of cusps. There is mild  calcification of the aortic valve. There is mild thickening of the aortic  valve. Aortic valve regurgitation is not visualized. No aortic stenosis is  present.  6. The inferior vena cava is normal in size with greater than 50%  respiratory variability, suggesting right atrial pressure of 3 mmHg.  _______________  Left Cardiac Catheterization 03/21/2020:  Mid LM to Dist LM lesion is 70% stenosed.  Ost LAD to Prox LAD lesion is 70% stenosed.  Ramus lesion is 70% stenosed.  Ost Cx to Mid Cx lesion is 99% stenosed.  Prox RCA lesion is 40% stenosed.  There is moderate left ventricular systolic dysfunction.  LV end diastolic pressure is severely elevated.  The left ventricular ejection fraction is 35-45% by visual estimate.  1. Severe complex CAD. There is a tapering 70% stenosis in the distal Left main involving as well the ostium of the LAD and ramus intermediate with hazy appearance. The LCx is a co-dominant vessel with extensive dissection involving the ostium and proximal vessel and is is subtotally occluded. 2. Moderate LV dysfunction. EF 40-45% 3. Severely elevated LVEDP 30 mm Hg.  Patient Profile     70 y.o. male with a history of PAD s/p left femoral-popliteal bypass which ultimately required left BKA in 08/2015 due to maggot infestation and right BKA, prior CVA, hypertension, hyperlipidemia, type 2 diabetes on insulin, CKD stage III, hepatitis C, and GERD who presented to [facility] with acute pulmonary edema and hypertensive urgency after presenting with dyspnea and reports of chest pain. He rule in for MI and was ultimately transferred to  [facility] for further work-up.  Assessment & Plan    CAD:  Medical management.  Prognosis thought to be poor. Hemoglobin has been stable and platelets are normal. Would continue aspirin 81 mg daily. Imdur for angina benefit and CHF. Crestor 20 mg daily.  ACUTE ON CHRONIC SYSTOLIC AND DIASTOLIC HF:   Net negative almost 8 liters.  Switch to oral lasix 80 mg daily starting tomorrow. On imdur/hydralazine/coreg/amlodipine.   HTN:   BP appears controlled on current regimen.  AKI ON CKD IIIA:    Creatinine stable around 1.8.  For questions or updates, please contact [place] Please consult [email or web address] for contact info under Cardiology/STEMI.  Pixie Casino, MD, [facility], [place] Director of the Advanced Lipid Disorders &  Cardiovascular Risk Reduction Clinic Diplomate of the American Board of Clinical Lipidology Attending Cardiologist  Direct Dial: [phone]  Fax: [phone]  Website:  [email or web address]  Pixie Casino, MD  03/28/2020, 9:15 AM

## 2020-03-28 NOTE — Progress Notes (Signed)
PROGRESS NOTE    Cory Weber  CLE:[id number] DOB: 07/25/1950 DOA: 03/20/2020 PCP: Iona Beard, MD   Brief Narrative: 70 year old with past medical history significant for CKD, CVA, hepatitis C, bilateral BKA, hyperlipidemia, BPH, GERD, anemia, asthma who presents complaining of acute onset shortness of breath.  Patient is oxygen dependent and normally wears 2 L of oxygen at baseline.  His pulse ox dropped into the 70s and he was placed on nonrebreather when he arrived.  He has not had cough or fever.  He had Covid 2 months ago.  He denies chest pain, fever, diarrhea.  He does report that he has had occasional black stool over the last month.  He reports shortness of breath when lying flat.  He did have chest pain several days prior to admission.  He lives at a skilled nursing facility.  Patient was found to have acute hypoxic respiratory failure secondary to heart failure exacerbation and non-STEMI.  Patient was transferred from [facility] to [facility] for cardiac catheterization. Cardiac cath performed 2/8 show:  Severe complex CAD. There is a tapering 70% stenosis in the distal Left main involving as well the ostium of the LAD and ramus intermediate with hazy appearance. The LCx is a co-dominant vessel with extensive dissection involving the ostium and proximal vessel and is is subtotally occluded. Moderate LV dysfunction. EF 40-45%. Severely elevated LVEDP 30 mm Hg.  Plan for medical treatment for NSTEMI, patient was transition to oral lasix 2/15, plan to monitor hb.   Assessment & Plan:   Principal Problem:   NSTEMI (non-ST elevated myocardial infarction) ([place]) Active Problems:   Polysubstance abuse ([place])   Tobacco abuse   Melena   Chronic kidney disease, stage 3 (HCC)   Essential hypertension   Chronic hepatitis C without hepatic coma (HCC)   History of CVA (cerebrovascular accident) without residual deficits   Phantom limb pain (HCC)   Anemia of chronic disease    PVD (peripheral vascular disease) (HCC)   Amputation of left lower extremity below knee (HCC)   GERD (gastroesophageal reflux disease)   Edema of right lower extremity   Uncontrolled type II diabetes with peripheral autonomic neuropathy (HCC)   Dyslipidemia associated with type 2 diabetes mellitus (HCC)   Physical deconditioning   Anemia, chronic disease   Chronic diastolic CHF (congestive heart failure) ([place])   PAD (peripheral artery disease) ([place])   DNR (do not resuscitate)   Heart failure ([place])   1-Non-STEMI: -Patient was treated with heparin drip and nitroglycerin drip.  Transition to Imdur.  -Troponin peaked at 12,625. -Cardiology consulted, patient underwent cardiac catheterization 12/8 which showed: Severe complex CAD. There is a tapering 70% stenosis in the distal Left main involving as well the ostium of the LAD and ramus intermediate with hazy appearance. The LCx is a co-dominant vessel with extensive dissection involving the ostium and proximal vessel and is is subtotally occluded. -Medical management.  No acute option for PCI, per cardiology patient is not a good candidate for CABG. -imdur increase to 120 mg daily.  -Continue with Norvasc.  -Already on aspirin/ occult blood positive but hb has remain stable. Monitor hb closely.   2-Acute systolic/diastolic heart failure exacerbation with acute hypoxic respiratory failure and pulmonary edema: -Patient presented with oxygen saturation in the 70s, initially placed on nonrebreather, currently on 2 L. -Chest x-ray show pulmonary edema, BNP 213.  Echo ejection fraction 40 to 45%. -Started on IV lasix 2/09. -Continue with IV lasix.  Negative 8 L -  Monitor renal function.  -transition to oral lasix 2/15. Cr mildly increase to 1.8  3-Hypertensive urgency: Treated initially with nitroglycerin drip.  Continue with  Hydralazine, Norvasc, coreg.  BP better controlled.   4-Hyperlipidemia: Continue with statin 5-history of CVA:  Continue with aspirin statin  plavix held due to anemia.   GERD: Continue with PPI  Hepatitis C: needs to  follow-up as an outpatient  Diabetes type 2 with nephropathy: Continue with Lantus and sliding scale insulin.  Anemia of chronic disease: Hemoglobin baseline 9 Received 1 unit packed red blood cell this admission.  Occult blood positive but hd relatively stable Hb : 8--7.7 On oral iron.   Peripheral vascular disease: Status post left BKA 2017, right BKA 2018  Morbid obesity BMI 42 Need lifestyle modification  Chronic kidney disease a stage IIIa: Continue to monitor Cr increase to 1. 7---1.9--1.9--1.8--1.7-1.8 Monitor closely on lasix.    Dysuria;  Awaiting for UA to be send   Estimated body mass index is 41.54 kg/m as calculated from the following:   Height as of 10/23/19: 5\' 7"  (1.702 m).   Weight as of this encounter: 120.3 kg.   DVT prophylaxis: Heparin  Code Status: DNR Family Communication: Daughter over phone 2/11 Disposition Plan:  Status is: Inpatient  Remains inpatient appropriate because:Ongoing active pain requiring inpatient pain management   Dispo: The patient is from: Home              Anticipated d/c is to: Home              Anticipated d/c date is: 3 days              Patient currently is not medically stable to d/c.   Difficult to place patient No        Consultants:   Cardiology   Procedures:    CATH; Mid LM to Dist LM lesion is 70% stenosed.  Ost LAD to Prox LAD lesion is 70% stenosed.  Ramus lesion is 70% stenosed.  Ost Cx to Mid Cx lesion is 99% stenosed.  Prox RCA lesion is 40% stenosed.  There is moderate left ventricular systolic dysfunction.  LV end diastolic pressure is severely elevated.  The left ventricular ejection fraction is 35-45% by visual estimate.   1. Severe complex CAD. There is a tapering 70% stenosis in the distal Left main involving as well the ostium of the LAD and ramus intermediate with  hazy appearance. The LCx is a co-dominant vessel with extensive dissection involving the ostium and proximal vessel and is is subtotally occluded. 2. Moderate LV dysfunction. EF 40-45% 3. Severely elevated LVEDP 30 mm Hg.   Antimicrobials:    Subjective: He is breathing better, feeling better. Denies chest pain.   Objective: Vitals:   03/28/20 0400 03/28/20 0847 03/28/20 0910 03/28/20 1318  BP: 114/75  131/87 131/65  Pulse:  92 92 83  Resp: 16     Temp:  98.9 F (37.2 C)    TempSrc:  Oral    SpO2:  99%    Weight:        Intake/Output Summary (Last 24 hours) at 03/28/2020 1707 Last data filed at 03/28/2020 1324 Gross per 24 hour  Intake 957 ml  Output 2800 ml  Net -1843 ml   Filed Weights   03/26/20 0423 03/27/20 0650 03/28/20 0319  Weight: 121.7 kg 120.4 kg 120.3 kg    Examination:  General exam: NAD Respiratory system: CTA Cardiovascular system: S 1, S 2 RRR  Gastrointestinal system: BS present, soft, nt Central nervous system: Alert Extremities: B/L BKA    Data Reviewed: I have personally reviewed following labs and imaging studies  CBC: Recent Labs  Lab 03/23/20 0234 03/24/20 0123 03/25/20 0410 03/27/20 0220 03/28/20 1306  WBC 5.7 5.6 7.0 6.8 6.7  HGB 8.1* 8.3* 8.7* 8.0* 7.7*  HCT 26.5* 27.4* 28.5* 26.5* 24.6*  MCV 94.3 95.5 94.1 94.6 92.8  PLT 238 240 286 261 270   Basic Metabolic Panel: Recent Labs  Lab 03/22/20 0243 03/23/20 0234 03/24/20 0123 03/25/20 0410 03/26/20 0326 03/27/20 0220 03/28/20 0218  NA 136 135 133* 135 136 135 134*  K 4.3 3.6 3.5 3.6 3.6 3.8 3.9  CL 95* 92* 92* 94* 94* 94* 92*  CO2 34* 34* 34* 35* 35* 34* 35*  GLUCOSE 207* 261* 292* 277* 191* 151* 211*  BUN 14 18 22 21  24* 28* 33*  CREATININE 1.45* 1.77* 1.91* 1.92* 1.81* 1.78* 1.83*  CALCIUM 8.8* 8.9 8.7* 8.6* 8.7* 8.8* 8.7*  MG 2.1 2.3 2.3 2.2  --   --   --    GFR: Estimated Creatinine Clearance: 48.6 mL/min (A) (by C-G formula based on SCr of 1.83 mg/dL  (H)). Liver Function Tests: No results for input(s): AST, ALT, ALKPHOS, BILITOT, PROT, ALBUMIN in the last 168 hours. No results for input(s): LIPASE, AMYLASE in the last 168 hours. No results for input(s): AMMONIA in the last 168 hours. Coagulation Profile: No results for input(s): INR, PROTIME in the last 168 hours. Cardiac Enzymes: No results for input(s): CKTOTAL, CKMB, CKMBINDEX, TROPONINI in the last 168 hours. BNP (last 3 results) No results for input(s): PROBNP in the last 8760 hours. HbA1C: No results for input(s): HGBA1C in the last 72 hours. CBG: Recent Labs  Lab 03/27/20 1704 03/27/20 2124 03/28/20 0319 03/28/20 0603 03/28/20 1153  GLUCAP 90 243* 166* 235* 223*   Lipid Profile: No results for input(s): CHOL, HDL, LDLCALC, TRIG, CHOLHDL, LDLDIRECT in the last 72 hours. Thyroid Function Tests: No results for input(s): TSH, T4TOTAL, FREET4, T3FREE, THYROIDAB in the last 72 hours. Anemia Panel: No results for input(s): VITAMINB12, FOLATE, FERRITIN, TIBC, IRON, RETICCTPCT in the last 72 hours. Sepsis Labs: No results for input(s): PROCALCITON, LATICACIDVEN in the last 168 hours.  Recent Results (from the past 240 hour(s))  Culture, blood (Routine x 2)     Status: None   Collection Time: 03/20/20  1:15 AM   Specimen: BLOOD  Result Value Ref Range Status   Specimen Description BLOOD BLOOD LEFT HAND  Final   Special Requests   Final    BOTTLES DRAWN AEROBIC AND ANAEROBIC Blood Culture adequate volume   Culture   Final    NO GROWTH 5 DAYS Performed at [facility], [address], [place], [place] [address]    Report Status 03/25/2020 FINAL  Final  Culture, blood (Routine x 2)     Status: None   Collection Time: 03/20/20  1:19 AM   Specimen: BLOOD  Result Value Ref Range Status   Specimen Description BLOOD SITE NOT SPECIFIED  Final   Special Requests   Final    BOTTLES DRAWN AEROBIC AND ANAEROBIC Blood Culture adequate volume   Culture   Final    NO GROWTH 5  DAYS Performed at [facility], [address] [place], [place] [address]    Report Status 03/25/2020 FINAL  Final  SARS Coronavirus 2 by RT PCR (hospital order, performed in [facility] hospital lab) Nasopharyngeal Nasopharyngeal Swab  Status: None   Collection Time: 03/20/20  1:20 AM   Specimen: Nasopharyngeal Swab  Result Value Ref Range Status   SARS Coronavirus 2 NEGATIVE NEGATIVE Final    Comment: (NOTE) SARS-CoV-2 target nucleic acids are NOT DETECTED.  The SARS-CoV-2 RNA is generally detectable in upper and lower respiratory specimens during the acute phase of infection. The lowest concentration of SARS-CoV-2 viral copies this assay can detect is 250 copies / mL. A negative result does not preclude SARS-CoV-2 infection and should not be used as the sole basis for treatment or other patient management decisions.  A negative result may occur with improper specimen collection / handling, submission of specimen other than nasopharyngeal swab, presence of viral mutation(s) within the areas targeted by this assay, and inadequate number of viral copies (<250 copies / mL). A negative result must be combined with clinical observations, patient history, and epidemiological information.  Fact Sheet for Patients:   StrictlyIdeas.no  Fact Sheet for Healthcare Providers: BankingDealers.co.za  This test is not yet approved or  cleared by the [place] FDA and has been authorized for detection and/or diagnosis of SARS-CoV-2 by FDA under an Emergency Use Authorization (EUA).  This EUA will remain in effect (meaning this test can be used) for the duration of the COVID-19 declaration under Section 564(b)(1) of the Act, 21 U.S.C. section 360bbb-3(b)(1), unless the authorization is terminated or revoked sooner.  Performed at [facility], [address], [place], [place] [address]   MRSA PCR Screening     Status: Abnormal   Collection  Time: 03/21/20  6:59 AM   Specimen: Nasal Mucosa; Nasopharyngeal  Result Value Ref Range Status   MRSA by PCR POSITIVE (A) NEGATIVE Final    Comment:        The GeneXpert MRSA Assay (FDA approved for NASAL specimens only), is one component of a comprehensive MRSA colonization surveillance program. It is not intended to diagnose MRSA infection nor to guide or monitor treatment for MRSA infections. RESULT CALLED TO, READ BACK BY AND VERIFIED WITH: RN L.KING AT 7902 ON 03/21/2020 BY T.SAAD Performed at [facility] [address] [place], [place] [address]          Radiology Studies: No results found.      Scheduled Meds: . amLODipine  10 mg Oral Daily  . vitamin C  500 mg Oral BID  . aspirin EC  81 mg Oral Daily  . carvedilol  25 mg Oral BID WC  . DULoxetine  60 mg Oral Daily  . ferrous sulfate  325 mg Oral Q breakfast  . [START ON 03/29/2020] furosemide  80 mg Oral Daily  . heparin  5,000 Units Subcutaneous Q8H  . hydrALAZINE  50 mg Oral Q8H  . insulin aspart  0-15 Units Subcutaneous TID WC  . insulin aspart  0-5 Units Subcutaneous QHS  . insulin aspart  8 Units Subcutaneous TID AC  . insulin glargine  30 Units Subcutaneous QHS  . isosorbide mononitrate  120 mg Oral Daily  . lidocaine  1 patch Transdermal Daily  . pantoprazole  40 mg Oral Daily  . polyethylene glycol  17 g Oral Daily  . pregabalin  75 mg Oral BID  . rosuvastatin  20 mg Oral Daily  . senna  1 tablet Oral Daily  . sodium chloride flush  3 mL Intravenous Q12H  . sodium chloride flush  3 mL Intravenous Q12H  . tamsulosin  0.4 mg Oral Daily  . cyanocobalamin  500  mcg Oral Daily   Continuous Infusions: . sodium chloride       LOS: 8 days    Time spent: 35 minutes.     Elmarie Shiley, MD [facility]   If 7PM-7AM, please contact night-coverage [email or web address]  03/28/2020, 5:07 PM

## 2020-03-28 NOTE — Plan of Care (Signed)
?  Problem: Coping: ?Goal: Level of anxiety will decrease ?Outcome: Progressing ?  ?Problem: Safety: ?Goal: Ability to remain free from injury will improve ?Outcome: Progressing ?  ?

## 2020-03-28 NOTE — Progress Notes (Signed)
Occupational Therapy Treatment Patient Details Name: Cory Weber MRN: [id number] DOB: 1950-04-03 Today's Date: 03/28/2020    History of present illness Pt adm with acute hypoxic respiratory failure secondary to heart failure exacerbation and NSTEMI. Cardiac cath 2/8 severe complex CAD. Pt not candidate for CABG or PCI. PMH - bil BKA, ckd, cva, hep C, asthma   OT comments  Pt making gradual progress towards OT goals this session. Session focus on functional mobility as precursor to higher level BADLs. Pt agreeable to attempt anterior/posterior transfer into recliner. Pt able to shift trunk anteriorly off of bed with BUEs positioned on bed rails with MIN A, however once pt in long sitting noted tremors in BUEs and pt reports chest pain requesting to defer transfer. Vitals checked from supine with BP 119/76, pt on 2L O2 with VSS. Alerted RN. Pt would continue to benefit from skilled occupational therapy while admitted and after d/c to address the below listed limitations in order to improve overall functional mobility and facilitate independence with BADL participation. DC plan remains appropriate, will follow acutely per POC.     Follow Up Recommendations  Other (comment);Home health OT (return to SNF)    Equipment Recommendations  None recommended by OT    Recommendations for Other Services      Precautions / Restrictions Precautions Precautions: Fall Restrictions Weight Bearing Restrictions: No       Mobility Bed Mobility Overal bed mobility: Needs Assistance             General bed mobility comments: plan was to work on anterior posterior transfer into recliner with pt needing light MIN A to shift trunk anteriorly off bed, however once pt in long sitting pt not wanting to progress more d/t reports of chest pain.  Transfers                 General transfer comment: unable to attempt    Balance Overall balance assessment: Needs assistance Sitting-balance  support: Bilateral upper extremity supported   Sitting balance - Comments: pt required BUE support on bed rails to maintain long sitting position in bed                                   ADL either performed or assessed with clinical judgement   ADL Overall ADL's : Needs assistance/impaired                           Toilet Transfer Details (indicate cue type and reason): unable to attempt         Functional mobility during ADLs: Minimal assistance (bed mobility) General ADL Comments: pt contiues to present with decreased activity tolerance, impaired balance and pain this session     Vision       Perception     Praxis      Cognition Arousal/Alertness: Awake/alert Behavior During Therapy: WFL for tasks assessed/performed Overall Cognitive Status: Within Functional Limits for tasks assessed                                 General Comments: overall WFL for simple mobility tasks, although pt with some impaired insight into deficits reporting chest pain but unable to elaborate much more about, noted to have an episode of tremors in BUEs when attempting mobility howver pt again unable to elaborate  more on tremors.        Exercises Other Exercises Other Exercises: encouraged pt to work on shifting trunk anteriorly off of bed with BUEs placed on bed rails as precursor to higher level BADLs   Shoulder Instructions       General Comments BP 119/76 after reports of chest pain, all other VSS on 2L during session    Pertinent Vitals/ Pain       Pain Assessment: Faces Faces Pain Scale: Hurts a little bit Pain Location: chest Pain Descriptors / Indicators: Sore Pain Intervention(s): Monitored during session;Repositioned;Other (comment) (alerted RN about chest pain)  Home Living                                          Prior Functioning/Environment              Frequency  Min 2X/week        Progress Toward  Goals  OT Goals(current goals can now be found in the care plan section)  Progress towards OT goals: Progressing toward goals  Acute Rehab OT Goals Patient Stated Goal: feel better OT Goal Formulation: With patient Time For Goal Achievement: 04/07/20 Potential to Achieve Goals: Good  Plan Discharge plan remains appropriate;Frequency remains appropriate    Co-evaluation                 AM-PAC OT "6 Clicks" Daily Activity     Outcome Measure   Help from another person eating meals?: None Help from another person taking care of personal grooming?: A Little Help from another person toileting, which includes using toliet, bedpan, or urinal?: A Lot Help from another person bathing (including washing, rinsing, drying)?: A Lot Help from another person to put on and taking off regular upper body clothing?: A Little Help from another person to put on and taking off regular lower body clothing?: A Lot 6 Click Score: 16    End of Session Equipment Utilized During Treatment: Oxygen;Other (comment) (2L)  OT Visit Diagnosis: Other abnormalities of gait and mobility (R26.89);Muscle weakness (generalized) (M62.81);Pain   Activity Tolerance Patient limited by pain   Patient Left in bed;with call bell/phone within reach;with bed alarm set   Nurse Communication Mobility status;Other (comment) (reprots of chest pain and pt wanting Cory Weber)        Time: 0762-2633 OT Time Calculation (min): 21 min  Charges: OT General Charges $OT Visit: 1 Visit OT Treatments $Self Care/Home Management : 8-22 mins  Cory Alto., COTA/L Acute Rehabilitation Services [phone]    Cory Weber 03/28/2020, 1:53 PM

## 2020-03-29 DIAGNOSIS — R195 Other fecal abnormalities: Secondary | ICD-10-CM

## 2020-03-29 DIAGNOSIS — I5032 Chronic diastolic (congestive) heart failure: Secondary | ICD-10-CM | POA: Diagnosis not present

## 2020-03-29 DIAGNOSIS — D649 Anemia, unspecified: Secondary | ICD-10-CM | POA: Diagnosis not present

## 2020-03-29 DIAGNOSIS — I214 Non-ST elevation (NSTEMI) myocardial infarction: Secondary | ICD-10-CM | POA: Diagnosis not present

## 2020-03-29 LAB — CBC
HCT: 24.1 % — ABNORMAL LOW (ref 39.0–52.0)
Hemoglobin: 7.3 g/dL — ABNORMAL LOW (ref 13.0–17.0)
MCH: 28.9 pg (ref 26.0–34.0)
MCHC: 30.3 g/dL (ref 30.0–36.0)
MCV: 95.3 fL (ref 80.0–100.0)
Platelets: 263 10*3/uL (ref 150–400)
RBC: 2.53 MIL/uL — ABNORMAL LOW (ref 4.22–5.81)
RDW: 14.8 % (ref 11.5–15.5)
WBC: 6.5 10*3/uL (ref 4.0–10.5)
nRBC: 0 % (ref 0.0–0.2)

## 2020-03-29 LAB — GLUCOSE, CAPILLARY
Glucose-Capillary: 187 mg/dL — ABNORMAL HIGH (ref 70–99)
Glucose-Capillary: 180 mg/dL — ABNORMAL HIGH (ref 70–99)
Glucose-Capillary: 188 mg/dL — ABNORMAL HIGH (ref 70–99)
Glucose-Capillary: 257 mg/dL — ABNORMAL HIGH (ref 70–99)

## 2020-03-29 LAB — BASIC METABOLIC PANEL
Anion gap: 8 (ref 5–15)
BUN: 34 mg/dL — ABNORMAL HIGH (ref 8–23)
CO2: 32 mmol/L (ref 22–32)
Calcium: 8.6 mg/dL — ABNORMAL LOW (ref 8.9–10.3)
Chloride: 95 mmol/L — ABNORMAL LOW (ref 98–111)
Creatinine, Ser: 1.84 mg/dL — ABNORMAL HIGH (ref 0.61–1.24)
GFR, Estimated: 40 mL/min — ABNORMAL LOW (ref >60)
Glucose, Bld: 210 mg/dL — ABNORMAL HIGH (ref 70–99)
Potassium: 4.2 mmol/L (ref 3.5–5.1)
Sodium: 135 mmol/L (ref 135–145)

## 2020-03-29 MED ORDER — METOCLOPRAMIDE HCL 5 MG/ML IJ SOLN
10.0000 mg | Freq: Once | INTRAMUSCULAR | Status: AC
  Administered 2020-03-30: 10 mg via INTRAVENOUS
  Filled 2020-03-29: qty 2

## 2020-03-29 MED ORDER — PEG-KCL-NACL-NASULF-NA ASC-C 100 G PO SOLR
0.5000 | Freq: Once | ORAL | Status: AC
  Administered 2020-03-29: 100 g via ORAL
  Filled 2020-03-29: qty 1

## 2020-03-29 MED ORDER — PEG-KCL-NACL-NASULF-NA ASC-C 100 G PO SOLR: 1.0000 | Freq: Once | ORAL | Status: DC

## 2020-03-29 MED ORDER — FERROUS SULFATE 325 (65 FE) MG PO TABS
325.0000 mg | ORAL_TABLET | Freq: Every day | ORAL | Status: DC
  Administered 2020-04-01 – 2020-04-02 (×2): 325 mg via ORAL
  Filled 2020-03-29 (×2): qty 1

## 2020-03-29 MED ORDER — BISACODYL 5 MG PO TBEC
10.0000 mg | DELAYED_RELEASE_TABLET | Freq: Four times a day (QID) | ORAL | Status: AC
  Administered 2020-03-30 (×2): 10 mg via ORAL
  Filled 2020-03-29 (×2): qty 2

## 2020-03-29 NOTE — Plan of Care (Signed)
  Problem: Clinical Measurements: Goal: Respiratory complications will improve Outcome: Progressing   Problem: Coping: Goal: Level of anxiety will decrease Outcome: Progressing   Problem: Safety: Goal: Ability to remain free from injury will improve Outcome: Progressing   Problem: Nutrition: Goal: Adequate nutrition will be maintained Outcome: Completed/Met

## 2020-03-29 NOTE — Progress Notes (Addendum)
PROGRESS NOTE    Cory Weber  HFG:[id number] DOB: [birth year] DOA: 03/20/2020 PCP: Iona Beard, MD   Brief Narrative: 69/M resident of SNF, medical history significant for CKD, CVA, hepatitis C, bilateral BKA, hyperlipidemia, BPH, GERD, anemia, asthma who presented complaining of acute onset shortness of breath, he is on 2 L home O2 at baseline, was hypoxic with sats in the 70s upon arrival to the ED. -Also history of intermittent black stools few weeks ago -Admitted with acute hypoxic respiratory failure secondary to heart failure exacerbation, non-STEMI and anemia -Patient was transferred from [facility] to [facility] for cardiac catheterization. Cardiac cath performed 2/8 showed:  Severe complex CAD. ,  Medical management recommended, diuresed with IV Lasix and then transition to oral diuretics  -Also noted to have worsening of chronic anemia, transfused 2 units of PRBC this admission  Assessment & Plan:   NSTEMI: -Troponin peaked at 12,625. -Treated with heparin drip, nitrates  -cardiology consulted, patient underwent cardiac catheterization 12/8 which showed: Severe complex CAD. There is a tapering 70% stenosis in the distal Left main involving as well the ostium of the LAD and ramus intermediate with hazy appearance. The LCx is a co-dominant vessel with extensive dissection involving the ostium and proximal vessel and is is subtotally occluded. -Medical management recommended, not amenable to PCI, not a good candidate for CABG per cardiology -imdur increased to 120 mg daily.  -Continue  ASA  Acute systolic/diastolic heart failure exacerbation with acute hypoxic respiratory failure and pulmonary edema: Hypertensive urgency -Patient presented with oxygen saturation in the 70s, initially placed on nonrebreather, currently on 2 L. -Chest x-ray show pulmonary edema, BNP 213.  Echo ejection fraction 40 to 45%. -improved with diuretics, transitioned to oral lasix 2/15.   -Continue hydralazine Norvasc and Coreg  Anemia of chronic disease: Hemoglobin baseline 9 -Hemoglobin in the 7 range this admission, transfused 2 units of PRBC -Reports history of dark stools few weeks ago, Hemoccult positive -Hemoglobin drifting down again down to 7.3 this morning, will request gastroenterology consult, previous history of gastric ulcers in 2014 -Anemia panel suggestive of chronic disease primarily, mild iron deficiency -Plavix on hold  Hyperlipidemia: Continue with statin  history of CVA: Continue with aspirin statin  plavix held due to anemia.   GERD: Continue PPI  Hepatitis C: needs to  follow-up as an outpatient  Diabetes type 2 with nephropathy: Continue with Lantus and sliding scale insulin.  Peripheral vascular disease: Status post left BKA 2017, right BKA 2018  Morbid obesity BMI 42 Need lifestyle modification  Chronic kidney disease a stage IIIa: -stable around 1.7- 1.8   Dysuria;  Awaiting for UA to be send   Estimated body mass index is 41.37 kg/m as calculated from the following:   Height as of 10/23/19: 5\' 7"  (1.702 m).   Weight as of this encounter: 119.8 kg.   DVT prophylaxis: SCDs Code Status: DNR Family Communication: no family at bedside Disposition Plan:  Status is: Inpatient  Remains inpatient appropriate because:Ongoing active pain requiring inpatient pain management   Dispo: The patient is from: SNF              Anticipated d/c is to: SNF              Anticipated d/c date is: Likely 48 hours              Patient currently is not medically stable to d/c.   Difficult to place patient No   Consultants:  Cardiology   Procedures:    CATH; Mid LM to Dist LM lesion is 70% stenosed.  Ost LAD to Prox LAD lesion is 70% stenosed.  Ramus lesion is 70% stenosed.  Ost Cx to Mid Cx lesion is 99% stenosed.  Prox RCA lesion is 40% stenosed.  There is moderate left ventricular systolic dysfunction.  LV end diastolic  pressure is severely elevated.  The left ventricular ejection fraction is 35-45% by visual estimate.   1. Severe complex CAD. There is a tapering 70% stenosis in the distal Left main involving as well the ostium of the LAD and ramus intermediate with hazy appearance. The LCx is a co-dominant vessel with extensive dissection involving the ostium and proximal vessel and is is subtotally occluded. 2. Moderate LV dysfunction. EF 40-45% 3. Severely elevated LVEDP 30 mm Hg.   Antimicrobials:    Subjective: -Feels okay overall  Objective: Vitals:   03/28/20 1727 03/28/20 2046 03/29/20 0556 03/29/20 0905  BP:  126/61 135/68 140/75  Pulse: 100 92 87 92  Resp:  20 18 18   Temp:  98.6 F (37 C) 98.7 F (37.1 C)   TempSrc:  Oral Oral   SpO2:  95% 99% 99%  Weight:   119.8 kg     Intake/Output Summary (Last 24 hours) at 03/29/2020 1050 Last data filed at 03/29/2020 1030 Gross per 24 hour  Intake 480 ml  Output 1600 ml  Net -1120 ml   Filed Weights   03/27/20 0650 03/28/20 0319 03/29/20 0556  Weight: 120.4 kg 120.3 kg 119.8 kg    Examination:  General exam: Chronically ill pleasant male sitting up in bed, AAOx3, no distress CVS: S1-S2, regular rate and rhythm Lungs: Decreased breath sounds bases, otherwise clear Abdomen: Soft, nontender, bowel sounds present Extremities: Bilateral below-knee amputation   Data Reviewed: I have personally reviewed following labs and imaging studies  CBC: Recent Labs  Lab 03/24/20 0123 03/25/20 0410 03/27/20 0220 03/28/20 1306 03/29/20 0330  WBC 5.6 7.0 6.8 6.7 6.5  HGB 8.3* 8.7* 8.0* 7.7* 7.3*  HCT 27.4* 28.5* 26.5* 24.6* 24.1*  MCV 95.5 94.1 94.6 92.8 95.3  PLT 240 286 261 284 425   Basic Metabolic Panel: Recent Labs  Lab 03/23/20 0234 03/24/20 0123 03/25/20 0410 03/26/20 0326 03/27/20 0220 03/28/20 0218 03/29/20 0330  NA 135 133* 135 136 135 134* 135  K 3.6 3.5 3.6 3.6 3.8 3.9 4.2  CL 92* 92* 94* 94* 94* 92* 95*  CO2 34*  34* 35* 35* 34* 35* 32  GLUCOSE 261* 292* 277* 191* 151* 211* 210*  BUN 18 22 21  24* 28* 33* 34*  CREATININE 1.77* 1.91* 1.92* 1.81* 1.78* 1.83* 1.84*  CALCIUM 8.9 8.7* 8.6* 8.7* 8.8* 8.7* 8.6*  MG 2.3 2.3 2.2  --   --   --   --    GFR: Estimated Creatinine Clearance: 48.3 mL/min (A) (by C-G formula based on SCr of 1.84 mg/dL (H)). Liver Function Tests: No results for input(s): AST, ALT, ALKPHOS, BILITOT, PROT, ALBUMIN in the last 168 hours. No results for input(s): LIPASE, AMYLASE in the last 168 hours. No results for input(s): AMMONIA in the last 168 hours. Coagulation Profile: No results for input(s): INR, PROTIME in the last 168 hours. Cardiac Enzymes: No results for input(s): CKTOTAL, CKMB, CKMBINDEX, TROPONINI in the last 168 hours. BNP (last 3 results) No results for input(s): PROBNP in the last 8760 hours. HbA1C: No results for input(s): HGBA1C in the last 72 hours. CBG: Recent Labs  Lab 03/28/20 0603 03/28/20 1153 03/28/20 1701 03/28/20 2139 03/29/20 0603  GLUCAP 235* 223* 136* 221* 187*   Lipid Profile: No results for input(s): CHOL, HDL, LDLCALC, TRIG, CHOLHDL, LDLDIRECT in the last 72 hours. Thyroid Function Tests: No results for input(s): TSH, T4TOTAL, FREET4, T3FREE, THYROIDAB in the last 72 hours. Anemia Panel: No results for input(s): VITAMINB12, FOLATE, FERRITIN, TIBC, IRON, RETICCTPCT in the last 72 hours. Sepsis Labs: No results for input(s): PROCALCITON, LATICACIDVEN in the last 168 hours.  Recent Results (from the past 240 hour(s))  Culture, blood (Routine x 2)     Status: None   Collection Time: 03/20/20  1:15 AM   Specimen: BLOOD  Result Value Ref Range Status   Specimen Description BLOOD BLOOD LEFT HAND  Final   Special Requests   Final    BOTTLES DRAWN AEROBIC AND ANAEROBIC Blood Culture adequate volume   Culture   Final    NO GROWTH 5 DAYS Performed at [facility], [address], [place], [place] [address]    Report Status 03/25/2020  FINAL  Final  Culture, blood (Routine x 2)     Status: None   Collection Time: 03/20/20  1:19 AM   Specimen: BLOOD  Result Value Ref Range Status   Specimen Description BLOOD SITE NOT SPECIFIED  Final   Special Requests   Final    BOTTLES DRAWN AEROBIC AND ANAEROBIC Blood Culture adequate volume   Culture   Final    NO GROWTH 5 DAYS Performed at [facility], [address], [place], [place] [address]    Report Status 03/25/2020 FINAL  Final  SARS Coronavirus 2 by RT PCR (hospital order, performed in [facility] hospital lab) Nasopharyngeal Nasopharyngeal Swab     Status: None   Collection Time: 03/20/20  1:20 AM   Specimen: Nasopharyngeal Swab  Result Value Ref Range Status   SARS Coronavirus 2 NEGATIVE NEGATIVE Final    Comment: (NOTE) SARS-CoV-2 target nucleic acids are NOT DETECTED.  The SARS-CoV-2 RNA is generally detectable in upper and lower respiratory specimens during the acute phase of infection. The lowest concentration of SARS-CoV-2 viral copies this assay can detect is 250 copies / mL. A negative result does not preclude SARS-CoV-2 infection and should not be used as the sole basis for treatment or other patient management decisions.  A negative result may occur with improper specimen collection / handling, submission of specimen other than nasopharyngeal swab, presence of viral mutation(s) within the areas targeted by this assay, and inadequate number of viral copies (<250 copies / mL). A negative result must be combined with clinical observations, patient history, and epidemiological information.  Fact Sheet for Patients:   StrictlyIdeas.no  Fact Sheet for Healthcare Providers: BankingDealers.co.za  This test is not yet approved or  cleared by the [place] FDA and has been authorized for detection and/or diagnosis of SARS-CoV-2 by FDA under an Emergency Use Authorization (EUA).  This EUA will remain in effect  (meaning this test can be used) for the duration of the COVID-19 declaration under Section 564(b)(1) of the Act, 21 U.S.C. section 360bbb-3(b)(1), unless the authorization is terminated or revoked sooner.  Performed at [facility], [address], [place], [place] [address]   MRSA PCR Screening     Status: Abnormal   Collection Time: 03/21/20  6:59 AM   Specimen: Nasal Mucosa; Nasopharyngeal  Result Value Ref Range Status   MRSA by PCR POSITIVE (A) NEGATIVE Final    Comment:  The GeneXpert MRSA Assay (FDA approved for NASAL specimens only), is one component of a comprehensive MRSA colonization surveillance program. It is not intended to diagnose MRSA infection nor to guide or monitor treatment for MRSA infections. RESULT CALLED TO, READ BACK BY AND VERIFIED WITH: RN L.KING AT 7948 ON 03/21/2020 BY T.SAAD Performed at [place] [address], [place], [place] [address]      Scheduled Meds: . amLODipine  10 mg Oral Daily  . vitamin C  500 mg Oral BID  . aspirin EC  81 mg Oral Daily  . carvedilol  25 mg Oral BID WC  . DULoxetine  60 mg Oral Daily  . ferrous sulfate  325 mg Oral Q breakfast  . furosemide  80 mg Oral Daily  . heparin  5,000 Units Subcutaneous Q8H  . hydrALAZINE  50 mg Oral Q8H  . insulin aspart  0-15 Units Subcutaneous TID WC  . insulin aspart  0-5 Units Subcutaneous QHS  . insulin aspart  8 Units Subcutaneous TID AC  . insulin glargine  30 Units Subcutaneous QHS  . isosorbide mononitrate  120 mg Oral Daily  . lidocaine  1 patch Transdermal Daily  . pantoprazole  40 mg Oral Daily  . polyethylene glycol  17 g Oral Daily  . pregabalin  75 mg Oral BID  . rosuvastatin  20 mg Oral Daily  . senna  1 tablet Oral Daily  . sodium chloride flush  3 mL Intravenous Q12H  . sodium chloride flush  3 mL Intravenous Q12H  . tamsulosin  0.4 mg Oral Daily  . cyanocobalamin  500 mcg Oral Daily   Continuous Infusions: . sodium chloride       LOS: 9  days    Time spent: 35 minutes.   Domenic Polite, MD [facility]  03/29/2020, 10:50 AM

## 2020-03-29 NOTE — Plan of Care (Signed)
  Problem: Clinical Measurements: Goal: Respiratory complications will improve Outcome: Progressing   Problem: Coping: Goal: Level of anxiety will decrease Outcome: Progressing   Problem: Safety: Goal: Ability to remain free from injury will improve Outcome: Progressing   

## 2020-03-29 NOTE — Progress Notes (Addendum)
Heart Failure Navigator Progress Note  Assessed for Heart & Vascular TOC clinic readiness.  Unfortunately at this time the patient does not meet criteria due to long term SNF placement.   Navigator available for reassessment of patient.   Pricilla Holm, RN, BSN Heart Failure Nurse Navigator [phone]

## 2020-03-29 NOTE — Consult Note (Addendum)
[facility] Consult: 12:23 PM 03/29/2020  LOS: 9 days    Referring Provider: Dr Broadus John Primary Care Physician:  Iona Beard, MD Primary Gastroenterologist:  Dr. Gala Romney.     Reason for Consultation: Anemia.  FOBT +   HPI: Cory Weber is a 70 y.o. male.  IDDM.  S/P bil BKA.  Previous cocaine and alcohol abuse.  Hepatitis C.  Treated with Harvoni, negative viral load in 2017.  Morbid obesity, BMI 42.  CKD stage 3a.  CVA, on Plavix PTA.  08/2012 EGD with gastric ulcer 08/2012 colonoscopy. Nonbleeding internal hemorrhoids.  Tortuous colon.  4 mm cecal polyp (path: TA w/o HGD). 11/2012 EGD for follow-up of prior gastric ulcer.  Completed H. pylori treatment: Gastric diverticulum.  Gastric erosions and scar, previous ulcer completely healed.  Biopsies negative for H. pylori.  Suggested Nexium 40 mg daily 03/2015 EGD. For iron deficiency anemia and F3/F4 fibrosis on elastography.  Esophagus normal.  Nonbleeding erosive gastropathy.  Solitary, nonbleeding duodenal bulb AVM, not treated. Leading up to current admission he had been on omeprazole 20 mg/day  Resides at a SNF.  Admitted 10 days ago on transfer from [facility].  Issues were dyspnea despite chronic 2 L home oxygen. Diagnosed with acute hypoxic respiratory failure due to heart failure, non-STEMI.  Cardiac cath shows severe, complex CAD with recommendations of medical management, diuresis.  Meds include aspirin.  Anemic.  Transfused 2 PRBCs.  Baseline Hgb 9, dropped into 7's.   Reports black but not tarry stools for 1 or 2 episodes about 3 weeks ago.  Since then the stools have been brown when he sees them.  Appetite is good.  Has constipation often requiring laxatives.  Constipation sometimes associated with lower abdominal discomfort but not severe  pain.  Denies unusual or excessive bleeding or bruising.  No reflux symptoms, no dysphagia.      Past Medical History:  Diagnosis Date  . Anemia   . Asthma   . BPH (benign prostatic hyperplasia)   . Cellulitis and abscess of foot 07/2015  . Chronic diastolic heart failure ([place])    per Nursing facility  . Chronic kidney disease    CKD stage 3  . CVA (cerebral vascular accident) ([place]) 2015   denies residual on 08/15/2015  . GERD (gastroesophageal reflux disease)   . Hepatitis C    states he was diagnosed in 2007 or 2007 while living in [place], [place]  . Hepatitis C   . Hyperlipidemia   . Hypertension   . Ischemia of foot 05/31/2016  . Osteomyelitis ([place])    per Nursing Facility  . Peripheral neuropathy   . Pneumonia X 1  . PVD (peripheral vascular disease) ([place])   . Type II diabetes mellitus ([place])   . Wet gangrene ([place]) 06/10/2016    Past Surgical History:  Procedure Laterality Date  . ABDOMINAL AORTOGRAM N/A 06/03/2016   Procedure: Abdominal Aortogram;  Surgeon: Waynetta Sandy, MD;  Location: [place] [facility];  Service: Cardiovascular;  Laterality: N/A;  . AMPUTATION Left 08/16/2015  Procedure: LEFT BELOW THE KNEE AMPUTATION ;  Surgeon: Serafina Mitchell, MD;  Location: [place];  Service: Vascular;  Laterality: Left;  . AMPUTATION Right 08/22/2016   Procedure: AMPUTATION BELOW KNEE-RIGHT;  Surgeon: Waynetta Sandy, MD;  Location: [place];  Service: Vascular;  Laterality: Right;  . BACK SURGERY    . BELOW KNEE LEG AMPUTATION Left 08/16/2015  . BIOPSY N/A 09/03/2012   Procedure: BIOPSY;  Surgeon: Daneil Dolin, MD;  Location: [facility];  Service: Endoscopy;  Laterality: N/A;  gastric and gastric mucosa  . BIOPSY N/A 12/03/2012   Procedure: BIOPSY;  Surgeon: Daneil Dolin, MD;  Location: [facility];  Service: Endoscopy;  Laterality: N/A;  . COLONOSCOPY WITH PROPOFOL N/A 09/03/2012   AYT:[id number] polyp-removed as outlined above. Prominent internal hemorrhoids. Tubular  adenoma  . ESOPHAGOGASTRODUODENOSCOPY (EGD) WITH PROPOFOL N/A 09/03/2012   NAT:FTDDUK hernia. Gastric diverticulum. Gastric ulcers with associated erosions. Duodenal erosions. Status post gastric biopsy. H.PYLORI gastritis   . ESOPHAGOGASTRODUODENOSCOPY (EGD) WITH PROPOFOL N/A 12/03/2012   Dr. Gala Romney: gastric diverticulum, gastric erosions and scar. Previously noted gastric ulcer completed healed. Biopsy without H.pylori.   . ESOPHAGOGASTRODUODENOSCOPY (EGD) WITH PROPOFOL N/A 01/23/2016   Procedure: ESOPHAGOGASTRODUODENOSCOPY (EGD) WITH PROPOFOL;  Surgeon: Doran Stabler, MD;  Location: [facility];  Service: Gastroenterology;  Laterality: N/A;  . I & D EXTREMITY Right 07/11/2016   Procedure: DELAY PRIMARY CLOSURE FOOT AMPUTATION;  Surgeon: Edrick Kins, DPM;  Location: [facility];  Service: Podiatry;  Laterality: Right;  . LEFT HEART CATH AND CORONARY ANGIOGRAPHY N/A 03/21/2020   Procedure: LEFT HEART CATH AND CORONARY ANGIOGRAPHY;  Surgeon: Martinique, Peter M, MD;  Location: [place] [facility];  Service: Cardiovascular;  Laterality: N/A;  . LIVER BIOPSY  2005   Done in [place], [place]. Chronic hepatitis with mild periportal inflammation, lobular unicellular necrosis and portal fibrosis. Grade 2, stage 1-2.  . LOWER EXTREMITY ANGIOGRAPHY Right 06/03/2016   Procedure: Lower Extremity Angiography;  Surgeon: Waynetta Sandy, MD;  Location: [place] [facility];  Service: Cardiovascular;  Laterality: Right;  . MAXIMUM ACCESS (MAS)POSTERIOR LUMBAR INTERBODY FUSION (PLIF) 1 LEVEL N/A 11/25/2013   Procedure: FOR MAXIMUM ACCESS (MAS) POSTERIOR LUMBAR INTERBODY FUSION (PLIF) 1 LEVEL;  Surgeon: Eustace Moore, MD;  Location: [facility];  Service: Neurosurgery;  Laterality: N/A;  FOR MAXIMUM ACCESS (MAS) POSTERIOR LUMBAR INTERBODY FUSION (PLIF) 1 LEVEL LUMBAR 3-4  . PERIPHERAL VASCULAR ATHERECTOMY Right 06/03/2016   Procedure: Peripheral Vascular Atherectomy;  Surgeon: Waynetta Sandy, MD;  Location:  [place] [facility];  Service: Cardiovascular;  Laterality: Right;  SFA WITH STENT  . PERIPHERAL VASCULAR CATHETERIZATION Left 08/01/2015   Procedure: Lower Extremity Angiography;  Surgeon: Serafina Mitchell, MD;  Location: [facility];  Service: Cardiovascular;  Laterality: Left;  . PERIPHERAL VASCULAR CATHETERIZATION N/A 08/01/2015   Procedure: Abdominal Aortogram;  Surgeon: Serafina Mitchell, MD;  Location: [facility];  Service: Cardiovascular;  Laterality: N/A;  . PERIPHERAL VASCULAR CATHETERIZATION N/A 08/08/2015   Procedure: Abdominal Aortogram w/Lower Extremity;  Surgeon: Serafina Mitchell, MD;  Location: [place];  Service: Cardiovascular;  Laterality: N/A;  . PERIPHERAL VASCULAR CATHETERIZATION Left 08/08/2015   Procedure: Peripheral Vascular Balloon Angioplasty;  Surgeon: Serafina Mitchell, MD;  Location: [facility];  Service: Cardiovascular;  Laterality: Left;  left popiteal artery, left peronealtrunk, left post tibial  . POLYPECTOMY N/A 09/03/2012   Procedure: POLYPECTOMY;  Surgeon: Daneil Dolin, MD;  [facility];  Service: Endoscopy;  Laterality: N/A;  cecal polyp  . TONSILLECTOMY    . TRANSMETATARSAL AMPUTATION Right 06/13/2016   Procedure: RIGHT TRANSMETATARSAL AMPUTATION;  Surgeon: Waynetta Sandy, MD;  Location: [facility];  Service: Vascular;  Laterality: Right;  . TRANSMETATARSAL AMPUTATION Right 07/10/2016   Procedure: REVISION RIGHT TRANSMETATARSAL AMPUTATION;  Surgeon: Waynetta Sandy, MD;  Location: [facility];  Service: Vascular;  Laterality: Right;    Prior to Admission medications   Medication Sig Start Date End Date Taking? Authorizing Provider  acetaminophen (TYLENOL) 325 MG tablet Take 2 tablets (650 mg total) by mouth every 6 (six) hours as needed for mild pain or headache (fever >/= 101). 02/24/19  Yes Emokpae, Courage, MD  amLODipine (NORVASC) 5 MG tablet Take 5 mg by mouth daily.   Yes [provider]  baclofen (LIORESAL)  10 MG tablet Take 1 tablet (10 mg total) by mouth 3 (three) times daily as needed for muscle spasms. Patient taking differently: Take 10 mg by mouth 2 (two) times daily. For shoulder spasm 02/25/19  Yes Emokpae, Courage, MD  Baclofen 5 MG TABS Take 15 mg by mouth daily. For muscle spasms   Yes [provider]  carvedilol (COREG) 3.125 MG tablet Take 1 tablet (3.125 mg total) by mouth 2 (two) times daily with a meal. Patient taking differently: Take 6.25 mg by mouth 2 (two) times daily with a meal. 08/10/15  Yes Mikhail, [place], DO  cefTRIAXone (ROCEPHIN) 1 g injection Inject 1 g into the muscle every 12 (twelve) hours. 5 day course to begin 03/18/20 and end 03/23/20 for UTI 03/18/20  Yes [provider]  clopidogrel (PLAVIX) 75 MG tablet Take 1 tablet (75 mg total) by mouth daily with breakfast. 06/05/16  Yes Dhungel, Nishant, MD  cyanocobalamin 1000 MCG tablet Take 500 mcg by mouth daily.    Yes [provider]  DULoxetine (CYMBALTA) 30 MG capsule Take 60 mg by mouth daily. 10/20/19  Yes [provider]  ferrous sulfate 325 (65 FE) MG tablet Take 325 mg by mouth 3 (three) times daily with meals.   Yes [provider]  gabapentin (NEURONTIN) 400 MG capsule Take 400-600 mg by mouth See admin instructions. 400mg  twice a day, give 600mg  at bedtime   Yes [provider]  insulin glargine (LANTUS) 100 UNIT/ML injection Inject 0.26 mLs (26 Units total) into the skin at bedtime. Patient taking differently: Inject 30 Units into the skin at bedtime. 02/24/19  Yes Emokpae, Courage, MD  insulin lispro (HUMALOG) 100 UNIT/ML injection Inject 8 Units into the skin 3 (three) times daily before meals.   Yes [provider]  ipratropium-albuterol (DUONEB) 0.5-2.5 (3) MG/3ML SOLN Take 3 mLs by nebulization every 6 (six) hours as needed (SOB/ wheezing).   Yes [provider]  Lidocaine 4 % PTCH Apply 1 patch topically daily. Apply 1 patch to both stumps once  daily   Yes [provider]  lisinopril (ZESTRIL) 10 MG tablet Take 1 tablet (10 mg total) by mouth daily. 02/24/19  Yes Emokpae, Courage, MD  Melatonin 5 MG TABS Take 1 tablet by mouth at bedtime.   Yes [provider]  metFORMIN (GLUCOPHAGE) 850 MG tablet Take 850 mg by mouth 2 (two) times daily. 10/14/19  Yes [provider]  pantoprazole (PROTONIX) 20 MG tablet Take 20 mg by mouth daily. 09/26/19  Yes [provider]  polyethylene glycol (MIRALAX / GLYCOLAX) 17 g packet Take 17 g by mouth daily.  Yes [provider]  rosuvastatin (CRESTOR) 5 MG tablet Take 5 mg by mouth daily. 09/16/19  Yes [provider]  senna-docusate (SENOKOT-S) 8.6-50 MG tablet Take 2 tablets by mouth at bedtime. 02/25/19  Yes Emokpae, Courage, MD  torsemide (DEMADEX) 20 MG tablet Take 2 tablets (40 mg total) by mouth daily. 09/22/18 10/21/19 Yes Strader, Fransisco Hertz, PA-C  vitamin C (ASCORBIC ACID) 500 MG tablet Take 500 mg by mouth 2 (two) times daily.   Yes [provider]  aspirin EC 81 MG tablet Take 1 tablet (81 mg total) by mouth daily with breakfast. Patient not taking: Reported on 03/20/2020 02/24/19   Roxan Hockey, MD  gabapentin (NEURONTIN) 600 MG tablet Take 1 tablet (600 mg total) by mouth 3 (three) times daily. Patient not taking: No sig reported 07/15/16   Domenic Polite, MD  insulin aspart (NOVOLOG) 100 UNIT/ML injection Inject 5 Units into the skin 3 (three) times daily after meals. Patient not taking: Reported on 03/20/2020 07/15/16   Domenic Polite, MD  omeprazole (PRILOSEC) 20 MG capsule Take 1 capsule (20 mg total) by mouth daily. 02/24/19 02/24/20  Roxan Hockey, MD  pregabalin (LYRICA) 75 MG capsule Take 1 capsule by mouth 2 (two) times daily. 10/20/19   [provider]  tamsulosin (FLOMAX) 0.4 MG CAPS capsule Take 1 capsule (0.4 mg total) by mouth daily. Patient not taking: Reported on 03/20/2020 08/10/15   Cristal Ford, DO    Scheduled  Meds: . amLODipine  10 mg Oral Daily  . vitamin C  500 mg Oral BID  . aspirin EC  81 mg Oral Daily  . carvedilol  25 mg Oral BID WC  . DULoxetine  60 mg Oral Daily  . ferrous sulfate  325 mg Oral Q breakfast  . furosemide  80 mg Oral Daily  . hydrALAZINE  50 mg Oral Q8H  . insulin aspart  0-15 Units Subcutaneous TID WC  . insulin aspart  0-5 Units Subcutaneous QHS  . insulin aspart  8 Units Subcutaneous TID AC  . insulin glargine  30 Units Subcutaneous QHS  . isosorbide mononitrate  120 mg Oral Daily  . lidocaine  1 patch Transdermal Daily  . pantoprazole  40 mg Oral Daily  . polyethylene glycol  17 g Oral Daily  . pregabalin  75 mg Oral BID  . rosuvastatin  20 mg Oral Daily  . senna  1 tablet Oral Daily  . sodium chloride flush  3 mL Intravenous Q12H  . sodium chloride flush  3 mL Intravenous Q12H  . tamsulosin  0.4 mg Oral Daily  . cyanocobalamin  500 mcg Oral Daily   Infusions: . sodium chloride     PRN Meds: sodium chloride, acetaminophen, baclofen, ipratropium-albuterol, ondansetron (ZOFRAN) IV, sodium chloride flush   Allergies as of 03/20/2020 - Review Complete 03/20/2020  Allergen Reaction Noted  . No known allergies  07/09/2016    Family History  Problem Relation Age of Onset  . Breast cancer Mother   . Heart failure Mother   . Diabetes Father   . Hypertension Father   . Heart disease Father   . Hyperlipidemia Father   . Diabetes Sister   . Hypertension Sister   . Breast cancer Sister   . Colon cancer Neg Hx   . Liver disease Neg Hx     Social History   Socioeconomic History  . Marital status: Divorced    Spouse name: Not on file  . Number of children: 1  .  Years of education: Not on file  . Highest education level: Not on file  Occupational History  . Occupation: disabled  Tobacco Use  . Smoking status: Former Smoker    Packs/day: 1.00    Years: 47.00    Pack years: 47.00    Types: Cigarettes    Quit date: 07/13/2015    Years since  quitting: 4.7  . Smokeless tobacco: Never Used  Vaping Use  . Vaping Use: Never used  Substance and Sexual Activity  . Alcohol use: No    Alcohol/week: 0.0 standard drinks    Comment:  none 08/15/2015 "stopped drinking 3-4 years ago"  . Drug use: No    Comment: 08/15/2015 "last used cocaine 3-4 months ago"  . Sexual activity: Not Currently    Birth control/protection: Condom  Other Topics Concern  . Not on file  Social History Narrative  . Not on file   Social Determinants of Health   Financial Resource Strain: Not on file  Food Insecurity: Not on file  Transportation Needs: Not on file  Physical Activity: Not on file  Stress: Not on file  Social Connections: Not on file  Intimate Partner Violence: Not on file    REVIEW OF SYSTEMS: Constitutional: Says he was ambulating with artificial legs prior to this admission.  Denies weakness ENT:  No nose bleeds Pulm: Shortness of breath much improved.  No cough currently. CV:  No palpitations, no LE edema.  No angina GU:  No hematuria, no frequency GI: See HPI Heme: No unusual bleeding or bruising. Transfusions: In addition to transfusions this admission, he did receive blood transfusions a few years ago after one of his limbs was amputated. Neuro:  No headaches, no peripheral tingling or numbness Derm:  No itching, no rash or sores.  Endocrine:  No sweats or chills.  No polyuria or dysuria Immunization: Reviewed.  There is no records as to COVID-19 vaccination status Travel:  None beyond local counties in last few months.    PHYSICAL EXAM: Vital signs in last 24 hours: Vitals:   03/29/20 0556 03/29/20 0905  BP: 135/68 140/75  Pulse: 87 92  Resp: 18 18  Temp: 98.7 F (37.1 C)   SpO2: 99% 99%   Wt Readings from Last 3 Encounters:  03/29/20 119.8 kg  10/23/19 123.4 kg  10/21/19 123.4 kg    General: Obese, looks somewhat unwell but not acutely ill.  Comfortable, alert Head: No facial asymmetry or swelling.  No signs of  head trauma. Eyes: No scleral icterus.  No conjunctival pallor.  + Arcus senilis. Ears: Slightly hard of hearing Nose: No congestion or discharge Mouth: Edentulous.  Mucosa is moist, pink, clear.  Tongue is midline. Neck: No masses or JVD. Lungs: Clear bilaterally.  Overall diminished breath sounds.  No labored breathing, no cough. Heart: RRR.  No MRG.  S1, S2 present. Abdomen: Obese, soft.  Subcutaneous nodule which is slightly tender over on the left abdomen, patient says this is where he received an injection but there is no visible bruising on the surface.  Slight tenderness in the right upper quadrant and there is palpable nodularity in this region.   Rectal: Deferred Musc/Skeltl: Bilateral BKA Extremities: Bilateral BKA. Neurologic: Appropriate.  Alert and oriented x3.  Moves all 4 limbs, strength not tested. Skin: No rash, no sores, no suspicious lesions.   Psych: Pleasant, cooperative, appropriate questions, fluid speech  Intake/Output from previous day: 02/15 0701 - 02/16 0700 In: 600 [P.O.:600] Out: 1650 [Urine:1650] Intake/Output this  shift: Total I/O In: -  Out: 200 [Urine:200]  LAB RESULTS: Recent Labs    03/27/20 0220 03/28/20 1306 03/29/20 0330  WBC 6.8 6.7 6.5  HGB 8.0* 7.7* 7.3*  HCT 26.5* 24.6* 24.1*  PLT 261 284 263   BMET Lab Results  Component Value Date   NA 135 03/29/2020   NA 134 (L) 03/28/2020   NA 135 03/27/2020   K 4.2 03/29/2020   K 3.9 03/28/2020   K 3.8 03/27/2020   CL 95 (L) 03/29/2020   CL 92 (L) 03/28/2020   CL 94 (L) 03/27/2020   CO2 32 03/29/2020   CO2 35 (H) 03/28/2020   CO2 34 (H) 03/27/2020   GLUCOSE 210 (H) 03/29/2020   GLUCOSE 211 (H) 03/28/2020   GLUCOSE 151 (H) 03/27/2020   BUN 34 (H) 03/29/2020   BUN 33 (H) 03/28/2020   BUN 28 (H) 03/27/2020   CREATININE 1.84 (H) 03/29/2020   CREATININE 1.83 (H) 03/28/2020   CREATININE 1.78 (H) 03/27/2020   CALCIUM 8.6 (L) 03/29/2020   CALCIUM 8.7 (L) 03/28/2020   CALCIUM 8.8 (L)  03/27/2020   LFT No results for input(s): PROT, ALBUMIN, AST, ALT, ALKPHOS, BILITOT, BILIDIR, IBILI in the last 72 hours. PT/INR Lab Results  Component Value Date   INR 1.0 03/20/2020   INR 1.12 06/10/2016   INR 1.17 05/31/2016   Hepatitis Panel No results for input(s): HEPBSAG, HCVAB, HEPAIGM, HEPBIGM in the last 72 hours. C-Diff No components found for: CDIFF  RADIOLOGY STUDIES: No results found.    IMPRESSION:   *     Acute on chronic anemia.  *   Reports of limited black stool about 3 weeks PTA.  FOBT positive.  Gastric ulcer in 2014.  Ulcers healed, nonbleeding erosive gastropathy and nonbleeding duodenal AVM on subsequent EGDs. Outpatient takes Meprazole 20 mg a day and has no significant symptoms suggesting reflux disease or ulcer disease.  *    Chronic Plavix for history CVA.  On hold.  *    Hep C, treated with Harvoni with SVR.  LFTs normal. Liver fibrosis.  Hepatic steatosis on most recent CTAP w contrast 10/2019.  INR, platelets normal.  *   CHF.  CAD, " severe, complex" by cardiac cath with plans for medical management.  Acute respiratory decompensation improved.   PLAN:     *    EGD, Colonoscopy?.  Likely would be Friday.  Will confirm w Dr Lanette Ell.     Azucena Freed  03/29/2020, 12:23 PM Phone [phone]     Attending Physician Note   I have taken a history, examined the patient and reviewed the chart. I agree with the Advanced Practitioner's note, impression and recommendations.  Acute on chronic anemia with FOBT positive stool. Black stool a few weeks ago. History of gastric ulcer, gastric erosions, duodenal AVM. Severe complex coronary artery disease with CHF so he is high risk for endoscopic procedure / anesthesia related complications. After a good discussion he is agreeable to proceed with colonoscopy and EGD off Plavix. He fully understands his high risk situation and would like to proceed. Bowel prep will be challenging with bil BKA and  extensive nursing assistance will be necessary to complete. If unable to adequately complete a bowel prep will proceed with EGD alone.   Lucio Edward, MD FACG [phone]

## 2020-03-29 NOTE — Progress Notes (Signed)
Progress Note  Patient Name: Cory Weber Date of Encounter: 03/29/2020  [facility] Cardiologist: Formerly Dr. Bronson Ing; will need to establish care with alternative [place] provider.   Subjective   Feeling okay this morning. States they are planning to do a colonoscopy this admission to further evaluate his anemia. He denies chest pain or SOB. He is laying flat in bed comfortably.   Inpatient Medications    Scheduled Meds: . amLODipine  10 mg Oral Daily  . vitamin C  500 mg Oral BID  . aspirin EC  81 mg Oral Daily  . carvedilol  25 mg Oral BID WC  . DULoxetine  60 mg Oral Daily  . ferrous sulfate  325 mg Oral Q breakfast  . furosemide  80 mg Oral Daily  . heparin  5,000 Units Subcutaneous Q8H  . hydrALAZINE  50 mg Oral Q8H  . insulin aspart  0-15 Units Subcutaneous TID WC  . insulin aspart  0-5 Units Subcutaneous QHS  . insulin aspart  8 Units Subcutaneous TID AC  . insulin glargine  30 Units Subcutaneous QHS  . isosorbide mononitrate  120 mg Oral Daily  . lidocaine  1 patch Transdermal Daily  . pantoprazole  40 mg Oral Daily  . polyethylene glycol  17 g Oral Daily  . pregabalin  75 mg Oral BID  . rosuvastatin  20 mg Oral Daily  . senna  1 tablet Oral Daily  . sodium chloride flush  3 mL Intravenous Q12H  . sodium chloride flush  3 mL Intravenous Q12H  . tamsulosin  0.4 mg Oral Daily  . cyanocobalamin  500 mcg Oral Daily   Continuous Infusions: . sodium chloride     PRN Meds: sodium chloride, acetaminophen, baclofen, ipratropium-albuterol, ondansetron (ZOFRAN) IV, sodium chloride flush   Vital Signs    Vitals:   03/28/20 1727 03/28/20 2046 03/29/20 0556 03/29/20 0905  BP:  126/61 135/68 140/75  Pulse: 100 92 87 92  Resp:  20 18 18   Temp:  98.6 F (37 C) 98.7 F (37.1 C)   TempSrc:  Oral Oral   SpO2:  95% 99% 99%  Weight:   119.8 kg     Intake/Output Summary (Last 24 hours) at 03/29/2020 1001 Last data filed at 03/29/2020 0601 Gross per 24  hour  Intake 480 ml  Output 1400 ml  Net -920 ml   Last 3 Weights 03/29/2020 03/28/2020 03/27/2020  Weight (lbs) 264 lb 1.8 oz 265 lb 3.4 oz 265 lb 6.9 oz  Weight (kg) 119.8 kg 120.3 kg 120.4 kg      Telemetry    Sinus rhythm - Personally Reviewed  ECG    No new tracings - Personally Reviewed  Physical Exam   GEN: No acute distress.   Neck: No JVD Cardiac: RRR, no murmurs, rubs, or gallops.  Respiratory: Clear to auscultation bilaterally. GI: Soft, nontender, non-distended  MS: No edema; s/p bilateral BKA Neuro:  Nonfocal  Psych: Normal affect   Labs    High Sensitivity Troponin:   Recent Labs  Lab 03/20/20 0133 03/20/20 0324 03/21/20 0206 03/21/20 0414  TROPONINIHS 2,819* 3,337* 9,585* 12,625*      Chemistry Recent Labs  Lab 03/27/20 0220 03/28/20 0218 03/29/20 0330  NA 135 134* 135  K 3.8 3.9 4.2  CL 94* 92* 95*  CO2 34* 35* 32  GLUCOSE 151* 211* 210*  BUN 28* 33* 34*  CREATININE 1.78* 1.83* 1.84*  CALCIUM 8.8* 8.7* 8.6*  GFRNONAA 41* 40* 40*  ANIONGAP 7 7 8      Hematology Recent Labs  Lab 03/27/20 0220 03/28/20 1306 03/29/20 0330  WBC 6.8 6.7 6.5  RBC 2.80* 2.65* 2.53*  HGB 8.0* 7.7* 7.3*  HCT 26.5* 24.6* 24.1*  MCV 94.6 92.8 95.3  MCH 28.6 29.1 28.9  MCHC 30.2 31.3 30.3  RDW 14.6 15.1 14.8  PLT 261 284 263    BNPNo results for input(s): BNP, PROBNP in the last 168 hours.   DDimer No results for input(s): DDIMER in the last 168 hours.   Radiology    No results found.  Cardiac Studies   Echocardiogram 03/20/2020: Impression: 1. Apical hypokinesis. . Left ventricular ejection fraction, by  estimation, is 40 to 45%. The left ventricle has mildly decreased  function. The left ventricle demonstrates regional wall motion  abnormalities (see scoring diagram/findings for  description). There is mild left ventricular hypertrophy. Left ventricular  diastolic parameters are indeterminate.  2. Right ventricular systolic function is  normal. The right ventricular  size is normal.  3. Left atrial size was mildly dilated.  4. The mitral valve is normal in structure. No evidence of mitral valve  regurgitation. No evidence of mitral stenosis.  5. The aortic valve has an indeterminant number of cusps. There is mild  calcification of the aortic valve. There is mild thickening of the aortic  valve. Aortic valve regurgitation is not visualized. No aortic stenosis is  present.  6. The inferior vena cava is normal in size with greater than 50%  respiratory variability, suggesting right atrial pressure of 3 mmHg.  _______________  Left Cardiac Catheterization 03/21/2020:  Mid LM to Dist LM lesion is 70% stenosed.  Ost LAD to Prox LAD lesion is 70% stenosed.  Ramus lesion is 70% stenosed.  Ost Cx to Mid Cx lesion is 99% stenosed.  Prox RCA lesion is 40% stenosed.  There is moderate left ventricular systolic dysfunction.  LV end diastolic pressure is severely elevated.  The left ventricular ejection fraction is 35-45% by visual estimate.  1. Severe complex CAD. There is a tapering 70% stenosis in the distal Left main involving as well the ostium of the LAD and ramus intermediate with hazy appearance. The LCx is a co-dominant vessel with extensive dissection involving the ostium and proximal vessel and is is subtotally occluded. 2. Moderate LV dysfunction. EF 40-45% 3. Severely elevated LVEDP 30 mm Hg.   Patient Profile     70 y.o. 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 He rule in for MI and was ultimately transferred to [facility] for further  work-up.  Assessment & Plan    1. Severe multivessel CAD: Patient presented with SOB and CP. HsTrop peaked at 12,625. EKG was non-ischemic. He underwent LHC 03/21/20 which showed 70% dLM stenosis, 70% ostial LAD-pLAD stenosis, 70% ramus stenosis, 99% ostial LCx-mLCx stenosis with extensive dissection, and 40% pRCA stenosis, with EF 40-45% and severely elevated LVEDP.  He was felt to be a poor candidate for CABG and high risk for PCI given highly calcific vessels and ongoing anemia requiring transfusions. He was ultimately recommended for medical management.  - Continue aspirin and statin - Continue carvedilol and amlodipine - Continue  imdur  2. Acute combined CHF: Echo this admission with EF 40-45%, apical hypokinesis, mild LVH, indeterminate LV diastolic function, mild LAE, and no significant valvular abnormalities. LVEDP was significantly elevated on LHC. He has been diuresed with IV lasix and transitioned to po lasix today. UOP remains net -1L in the past 24 hours with at least 1 unmeasured UOP occurrence, net -9L this admission. Weight is 264lbs today, down from 275lbs on admission. Unable to add ACEi/ARB/ARNI - Continue po lasix - Continue imdur and hydralazine - Continue carvedilol   3. HTN: BP stable - Managed in the context of #2  4. AoCKD stage 3: Cr stable at 1.8 today, up from 1.3 on admission.  - Continue to monitor closely  5. Anemia: Hgb 7.6 on admission, improved with 2 uPRBC, however trending down again, now 7.3 today. Patient states he will undergo a colonoscopy this admission to further evaluate his anemia - Continue management per primary team - Would transfuse for goal >8 given CAD       For questions or updates, please contact [facility] Please consult [email or web address] for contact info under        Signed, Abigail Butts, PA-C  03/29/2020, 10:01 AM

## 2020-03-30 DIAGNOSIS — R195 Other fecal abnormalities: Secondary | ICD-10-CM | POA: Diagnosis not present

## 2020-03-30 DIAGNOSIS — D638 Anemia in other chronic diseases classified elsewhere: Secondary | ICD-10-CM

## 2020-03-30 LAB — BASIC METABOLIC PANEL
Anion gap: 8 (ref 5–15)
BUN: 34 mg/dL — ABNORMAL HIGH (ref 8–23)
CO2: 32 mmol/L (ref 22–32)
Calcium: 8.6 mg/dL — ABNORMAL LOW (ref 8.9–10.3)
Chloride: 96 mmol/L — ABNORMAL LOW (ref 98–111)
Creatinine, Ser: 1.9 mg/dL — ABNORMAL HIGH (ref 0.61–1.24)
GFR, Estimated: 38 mL/min — ABNORMAL LOW (ref >60)
Glucose, Bld: 154 mg/dL — ABNORMAL HIGH (ref 70–99)
Potassium: 4.7 mmol/L (ref 3.5–5.1)
Sodium: 136 mmol/L (ref 135–145)

## 2020-03-30 LAB — CBC
HCT: 23.8 % — ABNORMAL LOW (ref 39.0–52.0)
Hemoglobin: 7.2 g/dL — ABNORMAL LOW (ref 13.0–17.0)
MCH: 28.6 pg (ref 26.0–34.0)
MCHC: 30.3 g/dL (ref 30.0–36.0)
MCV: 94.4 fL (ref 80.0–100.0)
Platelets: 275 10*3/uL (ref 150–400)
RBC: 2.52 MIL/uL — ABNORMAL LOW (ref 4.22–5.81)
RDW: 15 % (ref 11.5–15.5)
WBC: 6 10*3/uL (ref 4.0–10.5)
nRBC: 0 % (ref 0.0–0.2)

## 2020-03-30 LAB — GLUCOSE, CAPILLARY
Glucose-Capillary: 128 mg/dL — ABNORMAL HIGH (ref 70–99)
Glucose-Capillary: 118 mg/dL — ABNORMAL HIGH (ref 70–99)
Glucose-Capillary: 226 mg/dL — ABNORMAL HIGH (ref 70–99)
Glucose-Capillary: 335 mg/dL — ABNORMAL HIGH (ref 70–99)
Glucose-Capillary: 90 mg/dL (ref 70–99)

## 2020-03-30 LAB — HEMOGLOBIN AND HEMATOCRIT, BLOOD
HCT: 26 % — ABNORMAL LOW (ref 39.0–52.0)
Hemoglobin: 8.4 g/dL — ABNORMAL LOW (ref 13.0–17.0)

## 2020-03-30 LAB — PREPARE RBC (CROSSMATCH)

## 2020-03-30 MED ORDER — PEG-KCL-NACL-NASULF-NA ASC-C 100 G PO SOLR
0.5000 | Freq: Once | ORAL | Status: AC
  Administered 2020-03-30: 100 g via ORAL
  Filled 2020-03-30: qty 1

## 2020-03-30 MED ORDER — PEG-KCL-NACL-NASULF-NA ASC-C 100 G PO SOLR
0.5000 | Freq: Once | ORAL | Status: AC
  Administered 2020-03-30: 100 g via ORAL

## 2020-03-30 MED ORDER — BISACODYL 5 MG PO TBEC
20.0000 mg | DELAYED_RELEASE_TABLET | Freq: Once | ORAL | Status: AC
  Administered 2020-03-30: 20 mg via ORAL
  Filled 2020-03-30: qty 4

## 2020-03-30 MED ORDER — SODIUM CHLORIDE 0.9% IV SOLUTION: Freq: Once | INTRAVENOUS | Status: AC

## 2020-03-30 NOTE — Progress Notes (Signed)
   Chart reviewed - work-up of recurrent anemia in progress. Net negative. Creatinine relatively stable. No further suggestions at this time.  [facility] will sign off.   Medication Recommendations:  Continue current meds Other recommendations (labs, testing, etc):  none Follow up as an outpatient:  APP in [place], MD, [facility], [place] Director of the Advanced Lipid Disorders &  Cardiovascular Risk Reduction Clinic Diplomate of the American Board of Clinical Lipidology Attending Cardiologist  Direct Dial: [phone]  Fax: [phone]  Website:  [email or web address]

## 2020-03-30 NOTE — Progress Notes (Signed)
Occupational Therapy Treatment Patient Details Name: Cory Weber MRN: [id number] DOB: 08-09-1950 Today's Date: 03/30/2020    History of present illness Pt adm with acute hypoxic respiratory failure secondary to heart failure exacerbation and NSTEMI. Cardiac cath 2/8 severe complex CAD. Pt not candidate for CABG or PCI. PMH - bil BKA 2017/2018, ckd, cva, hep C, asthma with 2L O2 [place] baseline.   OT comments  Session limited by loose BM from taking colonoscopy prep. Intentions were to work on lateral scoot transfer to drop arm recliner with SB however deferred transfer training session d/t frequent BM. Session focus on bed mobility and LB ADLs. Pt able to roll R<>L with min guard assist but needing MIN A at times to maintain full sidelying position, total A for all LB ADLs and MAX A for UB  ADLS. Pt would continue to benefit from skilled occupational therapy while admitted and after d/c to address the below listed limitations in order to improve overall functional mobility and facilitate independence with BADL participation. DC plan remains appropriate, will follow acutely per POC.     Follow Up Recommendations  Other (comment);Home health OT (return to SNF)    Equipment Recommendations  None recommended by OT    Recommendations for Other Services      Precautions / Restrictions Precautions Precautions: Fall Restrictions Weight Bearing Restrictions: No Other Position/Activity Restrictions: bil BKA       Mobility Bed Mobility Overal bed mobility: Needs Assistance Bed Mobility: Rolling Rolling: Min assist;Min guard         General bed mobility comments: MIN A to maintain full sidelying position for pericare, min guard to roll R<>L for cleaning  Transfers                 General transfer comment: deferred OOB transfer d/t liquid WB    Balance                                           ADL either performed or assessed with clinical judgement    ADL Overall ADL's : Needs assistance/impaired             Lower Body Bathing: Total assistance;Bed level   Upper Body Dressing : Maximal assistance;Bed level Upper Body Dressing Details (indicate cue type and reason): to don new gown       Toilet Transfer Details (indicate cue type and reason): defer as pt with liquid stool from drinking colonscopy prep Toileting- Clothing Manipulation and Hygiene: Total assistance;Bed level       Functional mobility during ADLs: Minimal assistance;Min guard (bed mobility only) General ADL Comments: limited session as pt with liquid stook from drinking colonoscopy prep, session focus on bed mobility and LB ADLs     Vision       Perception     Praxis      Cognition Arousal/Alertness: Awake/alert Behavior During Therapy: WFL for tasks assessed/performed                                            Exercises     Shoulder Instructions       General Comments drop arm recliner now in pts room for next sesssion to practice lateral scoot transfer with SB. pt  on 2L [place] during session  with sats [facility]    Pertinent Vitals/ Pain       Pain Assessment: Faces Faces Pain Scale: Hurts a little bit Pain Location: scrotum with pericare Pain Descriptors / Indicators: Discomfort Pain Intervention(s): Monitored during session  Home Living                                          Prior Functioning/Environment              Frequency  Min 2X/week        Progress Toward Goals  OT Goals(current goals can now be found in the care plan section)  Progress towards OT goals: Progressing toward goals  Acute Rehab OT Goals Patient Stated Goal: to get to w/c next session OT Goal Formulation: With patient Time For Goal Achievement: 04/07/20 Potential to Achieve Goals: Good  Plan Discharge plan remains appropriate;Frequency remains appropriate    Co-evaluation                 AM-PAC OT "6  Clicks" Daily Activity     Outcome Measure   Help from another person eating meals?: None Help from another person taking care of personal grooming?: A Little Help from another person toileting, which includes using toliet, bedpan, or urinal?: A Lot Help from another person bathing (including washing, rinsing, drying)?: A Lot   Help from another person to put on and taking off regular lower body clothing?: A Lot 6 Click Score: 13    End of Session Equipment Utilized During Treatment: Oxygen;Other (comment) (2L)  OT Visit Diagnosis: Other abnormalities of gait and mobility (R26.89);Muscle weakness (generalized) (M62.81);Pain   Activity Tolerance Other (comment) (limited by loose BM)   Patient Left in bed;with call bell/phone within reach;with bed alarm set;with nursing/sitter in room   Nurse Communication Mobility status        Time: 6160-7371 OT Time Calculation (min): 29 min  Charges: OT General Charges $OT Visit: 1 Visit OT Treatments $Self Care/Home Management : 23-37 mins  [name], COTA/L Acute Rehabilitation Services [phone]    Precious Haws 03/30/2020, 10:13 AM

## 2020-03-30 NOTE — H&P (View-Only) (Signed)
Daily Rounding Note  03/30/2020, 1:21 PM  LOS: 10 days   SUBJECTIVE:   Chief complaint:  Anemia.  FOBT +     Feels sleepy.  No pain.  Does not feel short of breath  Patient got movie prep last night but stools this morning are still mushy, brown.  OBJECTIVE:         Vital signs in last 24 hours:    Temp:  [98.2 F (36.8 C)-98.7 F (37.1 C)] 98.3 F (36.8 C) (02/17 1207) Pulse Rate:  [40-91] 90 (02/17 1207) Resp:  [18-22] 22 (02/17 1207) BP: (118-138)/(63-98) 118/98 (02/17 1207) SpO2:  [93 %-99 %] 93 % (02/17 1200) Weight:  [120.3 kg] 120.3 kg (02/17 0053) Last BM Date: 03/31/20 Filed Weights   03/28/20 0319 03/29/20 0556 03/30/20 0053  Weight: 120.3 kg 119.8 kg 120.3 kg   General: Obese, comfortable.  Not acutely ill-appearing Heart: RRR. Chest: No labored breathing.  No cough.  Breath sounds diminished but clear. Abdomen: Obese, protuberant, soft.  No tenderness.  Active bowel sounds. Extremities: Bilateral BKA Neuro/Psych: Alert.  Appropriate.  Intake/Output from previous day: 02/16 0701 - 02/17 0700 In: 720 [P.O.:240] Out: 2000 [Urine:2000]  Intake/Output this shift: Total I/O In: 360 [P.O.:360] Out: -   Lab Results: Recent Labs    03/28/20 1306 03/29/20 0330 03/30/20 0129  WBC 6.7 6.5 6.0  HGB 7.7* 7.3* 7.2*  HCT 24.6* 24.1* 23.8*  PLT 284 263 275   BMET Recent Labs    03/28/20 0218 03/29/20 0330 03/30/20 0129  NA 134* 135 136  K 3.9 4.2 4.7  CL 92* 95* 96*  CO2 35* 32 32  GLUCOSE 211* 210* 154*  BUN 33* 34* 34*  CREATININE 1.83* 1.84* 1.90*  CALCIUM 8.7* 8.6* 8.6*   LFT No results for input(s): PROT, ALBUMIN, AST, ALT, ALKPHOS, BILITOT, BILIDIR, IBILI in the last 72 hours. PT/INR No results for input(s): LABPROT, INR in the last 72 hours. Hepatitis Panel No results for input(s): HEPBSAG, HCVAB, HEPAIGM, HEPBIGM in the last 72 hours.  Studies/Results: No results found.    Scheduled Meds: . amLODipine  10 mg Oral Daily  . vitamin C  500 mg Oral BID  . aspirin EC  81 mg Oral Daily  . carvedilol  25 mg Oral BID WC  . DULoxetine  60 mg Oral Daily  . [START ON 04/01/2020] ferrous sulfate  325 mg Oral Q breakfast  . furosemide  80 mg Oral Daily  . hydrALAZINE  50 mg Oral Q8H  . insulin aspart  0-15 Units Subcutaneous TID WC  . insulin aspart  0-5 Units Subcutaneous QHS  . insulin aspart  8 Units Subcutaneous TID AC  . insulin glargine  30 Units Subcutaneous QHS  . isosorbide mononitrate  120 mg Oral Daily  . lidocaine  1 patch Transdermal Daily  . metoCLOPramide (REGLAN) injection  10 mg Intravenous Once   Followed by  . metoCLOPramide (REGLAN) injection  10 mg Intravenous Once  . pantoprazole  40 mg Oral Daily  . polyethylene glycol  17 g Oral Daily  . pregabalin  75 mg Oral BID  . rosuvastatin  20 mg Oral Daily  . senna  1 tablet Oral Daily  . sodium chloride flush  3 mL Intravenous Q12H  . sodium chloride flush  3 mL Intravenous Q12H  . tamsulosin  0.4 mg Oral Daily  . cyanocobalamin  500 mcg Oral Daily   Continuous Infusions: .  sodium chloride     PRN Meds:.sodium chloride, acetaminophen, baclofen, ipratropium-albuterol, ondansetron (ZOFRAN) IV, sodium chloride flush   ASSESMENT:   *     Acute on chronic anemia.  Hb 7.2 today.  *   FOBT +. Limited black stool about 3 weeks PTA.  Gastric ulcer in 2014.  20 mg omeprazole daily as outpatient.  Currently on Protonix 40 mg/day.  *    Chronic Plavix for history CVA.  On hold.  *    Hep C, treated with Harvoni with SVR.  LFTs normal. Liver fibrosis.  Hepatic steatosis on most recent CTAP w contrast 10/2019.  INR, platelets normal.  *   CHF.  CAD, " severe, complex" by cardiac cath with plans for medical management.  Acute respiratory decompensation improved.   *    AKI.  *    DM.   PLAN   *   Colonoscopy and EGD at 845 tomorrow.  See orders for repeat movie prep/Dulcolax/Reglan  bowel prep.  Clear liquids.  *    Hb/hct 5 PM tonight and 5 AM tomorrow.  Nursing care order placed stating to call attending for transfusion orders if Hb is 7 or less.   Azucena Freed  03/30/2020, 1:21 PM Phone [phone]     Attending Physician Note   I have taken an interval history, reviewed the chart and examined the patient. I agree with the Advanced Practitioner's note, impression and recommendations.   Occult blood in stool. Acute on chronic anemia for colonoscopy and EGD tomorrow. Primary service to maintain Hgb > 7.   Lucio Edward, MD FACG [phone]

## 2020-03-30 NOTE — Progress Notes (Signed)
Physical Therapy Treatment Patient Details Name: Cory Weber MRN: [id number] DOB: [birth year] Today's Date: 03/30/2020    History of Present Illness Pt adm with acute hypoxic respiratory failure secondary to heart failure exacerbation and NSTEMI. Cardiac cath 2/8 severe complex CAD. Pt not candidate for CABG or PCI. PMH - bil BKA 2017/2018, ckd, cva, hep C, asthma with 2L O2 [place] baseline. 1 unit PRBC on 2/17. Plan for Colonoscopy 2/18.    PT Comments    Pt received in supine, sleeping but easily awoken and agreeable to limited therapy session, with fair participation and tolerance for mobility. Pt performed rolling and posterior supine scooting with min to modA, refused EOB mobility due to colonoscopy prep/fatigue but performed supine BLE AROM therapeutic exercises with good tolerance. Pt continues to benefit from PT services to progress toward functional mobility goals. Continue to recommend return to SNF with PT.   Follow Up Recommendations  Other (comment) (return to long term care with PT)     Equipment Recommendations  None recommended by PT    Recommendations for Other Services       Precautions / Restrictions Precautions Precautions: Fall Restrictions Weight Bearing Restrictions: No Other Position/Activity Restrictions: bil BKA    Mobility  Bed Mobility Overal bed mobility: Needs Assistance Bed Mobility: Rolling Rolling: Min assist         General bed mobility comments: to roll L/R for sidelying exercises and transfer pad positioning; pt min to modA for supine scooting toward HOB, able to pull with BUE    Transfers                 General transfer comment: pt defers OOB due to colonoscopy prep/frequent BMs today  Ambulation/Gait                 Stairs             Wheelchair Mobility    Modified Rankin (Stroke Patients Only)       Balance                                            Cognition Arousal/Alertness:  Awake/alert (drowsy initially) Behavior During Therapy: WFL for tasks assessed/performed Overall Cognitive Status: Within Functional Limits for tasks assessed                                 General Comments: good following of 1-step commands, fair following of 2-step commands.      Exercises Amputee Exercises Quad Sets: AROM;Both;10 reps;Supine Gluteal Sets: AROM;Both;10 reps;Supine Towel Squeeze: AROM;Both;10 reps;Supine Hip Extension: AROM;Both;10 reps;Supine Hip ABduction/ADduction: AROM;Both;Sidelying;10 reps Hip Flexion/Marching: AROM;Both;10 reps;Supine    General Comments General comments (skin integrity, edema, etc.): VSS on 2L O2 [place]; encouraged pt to frequently reposition/roll to prevent sacral skin breakdown; pt refused EOB      Pertinent Vitals/Pain Pain Assessment: No/denies pain    Home Living                      Prior Function            PT Goals (current goals can now be found in the care plan section) Acute Rehab PT Goals Patient Stated Goal: to get to [facility] next session PT Goal Formulation: With patient Time For Goal Achievement: 04/06/20 Potential to Achieve  Goals: Good Progress towards PT goals: Progressing toward goals (slow progress)    Frequency    Min 2X/week      PT Plan Current plan remains appropriate    Co-evaluation              AM-PAC PT "6 Clicks" Mobility   Outcome Measure  Help needed turning from your back to your side while in a flat bed without using bedrails?: A Little Help needed moving from lying on your back to sitting on the side of a flat bed without using bedrails?: A Little Help needed moving to and from a bed to a chair (including a wheelchair)?: A Little Help needed standing up from a chair using your arms (e.g., wheelchair or bedside chair)?: Total Help needed to walk in hospital room?: Total Help needed climbing 3-5 steps with a railing? : Total 6 Click Score: 12    End of Session  Equipment Utilized During Treatment: Oxygen Activity Tolerance: Patient tolerated treatment well Patient left: in bed;with call bell/phone within reach;with bed alarm set Nurse Communication: Mobility status PT Visit Diagnosis: Other abnormalities of gait and mobility (R26.89);Muscle weakness (generalized) (M62.81)     Time: 7672-0947 PT Time Calculation (min) (ACUTE ONLY): 12 min  Charges:  $Therapeutic Exercise: 8-22 mins                     Gary Gabrielsen P., PTA Acute Rehabilitation Services Pager: [phone] Office: [place] 03/30/2020, 5:31 PM

## 2020-03-30 NOTE — Care Management Important Message (Signed)
Important Message  Patient Details  Name: Cory Weber MRN: [id number] Date of Birth: 1950/10/23   Medicare Important Message Given:  Yes     Shelda Altes 03/30/2020, 11:14 AM

## 2020-03-30 NOTE — TOC Progression Note (Signed)
Transition of Care [facility]) - Progression Note    Patient Details  Name: Cory Weber MRN: [id number] Date of Birth: 07-Jun-1950  [facility] CM/SW [place], [place] Phone Number: 03/30/2020, 2:09 PM  Clinical Narrative:     CSW contacted Debbie with [facility] to notify that pt may d/c over weekend. [facility] can accept over the weekend. Jackelyn Poling would be contact person [phone]. No new covid needed.   Expected Discharge Plan: Long Term Nursing Home Barriers to Discharge: Continued Medical Work up  Expected Discharge Plan and Services Expected Discharge Plan: [place] In-house Referral: Clinical Social Work Discharge Planning Services: NA Post Acute Care Choice: Nursing Home Living arrangements for the past 2 months: [place]                 DME Arranged: N/A DME Agency: NA       HH Arranged: NA HH Agency: NA         Social Determinants of Health (SDOH) Interventions    Readmission Risk Interventions Readmission Risk Prevention Plan 03/20/2020  Transportation Screening Complete  PCP or Specialist Appt within 3-5 Days Not Complete  Not Complete comments Patient resides at [facility] and the facility schedules hospital follow up appointments.  [place] or Home Care Consult Complete  Social Work Consult for [place] Planning/Counseling Complete  Palliative Care Screening Not Applicable  Medication Review Press photographer) Complete  Some recent data might be hidden

## 2020-03-30 NOTE — Progress Notes (Signed)
PROGRESS NOTE    Cory Weber  UKG:[id number] DOB: Aug 23, 1950 DOA: 03/20/2020 PCP: Iona Beard, MD   Brief Narrative: 69/M resident of SNF, medical history significant for CKD, CVA, hepatitis C, bilateral BKA, hyperlipidemia, BPH, GERD, anemia, asthma who presented complaining of acute onset shortness of breath, he is on 2 L home O2 at baseline, was hypoxic with sats in the 70s upon arrival to the ED. -Also history of intermittent black stools few weeks ago -Admitted with acute hypoxic respiratory failure secondary to heart failure exacerbation, non-STEMI and anemia -Patient was transferred from [facility] to [facility] for cardiac catheterization. Cardiac cath performed 2/8 showed:  Severe complex CAD. ,  Medical management recommended, diuresed with IV Lasix and then transition to oral diuretics  -Also noted to have worsening of chronic anemia, transfused 2 units of PRBC this admission  Assessment & Plan:   NSTEMI: -Troponin peaked at 12,620, treated with heparin drip, nitrates  -cardiology consulted, patient underwent cardiac catheterization 12/8 which showed: Severe complex CAD. There is a tapering 70% stenosis in the distal Left main involving as well the ostium of the LAD and ramus intermediate with hazy appearance. The LCx is a co-dominant vessel with extensive dissection involving the ostium and proximal vessel and is is subtotally occluded. -Medical management recommended, not amenable to PCI, not a good candidate for CABG per cardiology -continue ASA, coreg, imdur  Acute systolic/diastolic heart failure exacerbation with acute hypoxic respiratory failure and pulmonary edema: Hypertensive urgency -Patient presented with oxygen saturation in the 70s, initially placed on nonrebreather, currently on 2 L. -Chest x-ray show pulmonary edema, BNP 213.  Echo ejection fraction 40 to 45%. -improved with diuretics, transitioned to oral lasix 2/15.  -Continue hydralazine Norvasc  and Coreg  Anemia of chronic disease: Hemoglobin baseline 9 -Hemoglobin in the 7 range this admission, transfused 2 units of PRBC -Reports history of dark stools few weeks ago, Hemoccult positive -Hemoglobin down to 7.2, will transfuse 1 unit of PRBC,  previous history of gastric ulcers in 2014 -Anemia panel suggestive of chronic disease primarily, mild iron deficiency -Plavix on hold  Hyperlipidemia: Continue with statin  history of CVA: Continue with aspirin statin  plavix held due to anemia.   GERD: Continue PPI  Hepatitis C: needs to  follow-up as an outpatient  Diabetes type 2 with nephropathy: Continue with Lantus and sliding scale insulin.  Peripheral vascular disease: Status post left BKA 2017, right BKA 2018  Morbid obesity BMI 42 Need lifestyle modification  Chronic kidney disease a stage IIIa: -stable around 1.7- 1.8   Estimated body mass index is 41.54 kg/m as calculated from the following:   Height as of 10/23/19: 5\' 7"  (1.702 m).   Weight as of this encounter: 120.3 kg.   DVT prophylaxis: SCDs Code Status: DNR Family Communication: no family at bedside Disposition Plan:  Status is: Inpatient  Remains inpatient appropriate because:Ongoing active pain requiring inpatient pain management   Dispo: The patient is from: SNF              Anticipated d/c is to: SNF              Anticipated d/c date is: Likely 48 hours              Patient currently is not medically stable to d/c.   Difficult to place patient No   Consultants:   Cardiology   Procedures:    CATH; Mid LM to Dist LM lesion is 70% stenosed.  Ost LAD to Prox LAD lesion is 70% stenosed.  Ramus lesion is 70% stenosed.  Ost Cx to Mid Cx lesion is 99% stenosed.  Prox RCA lesion is 40% stenosed.  There is moderate left ventricular systolic dysfunction.  LV end diastolic pressure is severely elevated.  The left ventricular ejection fraction is 35-45% by visual estimate.   1. Severe  complex CAD. There is a tapering 70% stenosis in the distal Left main involving as well the ostium of the LAD and ramus intermediate with hazy appearance. The LCx is a co-dominant vessel with extensive dissection involving the ostium and proximal vessel and is is subtotally occluded. 2. Moderate LV dysfunction. EF 40-45% 3. Severely elevated LVEDP 30 mm Hg.   Antimicrobials:    Subjective: -feels ok, starting prep, no dark stools today  Objective: Vitals:   03/30/20 0053 03/30/20 0936 03/30/20 1152 03/30/20 1207  BP:  138/68 121/68 (!) 118/98  Pulse:  90 83 90  Resp:    (!) 22  Temp:   98.4 F (36.9 C) 98.3 F (36.8 C)  TempSrc:   Oral   SpO2:   96%   Weight: 120.3 kg       Intake/Output Summary (Last 24 hours) at 03/30/2020 1241 Last data filed at 03/30/2020 0900 Gross per 24 hour  Intake 1080 ml  Output 1800 ml  Net -720 ml   Filed Weights   03/28/20 0319 03/29/20 0556 03/30/20 0053  Weight: 120.3 kg 119.8 kg 120.3 kg    Examination:  General exam: Chronically ill pleasant male sitting up in bed, AAOx3, no distress CVS: S1-S2, regular rate rhythm Lungs: Decreased breath sounds bases, otherwise clear Abdomen: Soft, nontender, bowel sounds present Extremities: Bilateral below-knee amputation  Skin: no rashes on exposed skin  Data Reviewed: I have personally reviewed following labs and imaging studies  CBC: Recent Labs  Lab 03/25/20 0410 03/27/20 0220 03/28/20 1306 03/29/20 0330 03/30/20 0129  WBC 7.0 6.8 6.7 6.5 6.0  HGB 8.7* 8.0* 7.7* 7.3* 7.2*  HCT 28.5* 26.5* 24.6* 24.1* 23.8*  MCV 94.1 94.6 92.8 95.3 94.4  PLT 286 261 284 263 397   Basic Metabolic Panel: Recent Labs  Lab 03/24/20 0123 03/25/20 0410 03/26/20 0326 03/27/20 0220 03/28/20 0218 03/29/20 0330 03/30/20 0129  NA 133* 135 136 135 134* 135 136  K 3.5 3.6 3.6 3.8 3.9 4.2 4.7  CL 92* 94* 94* 94* 92* 95* 96*  CO2 34* 35* 35* 34* 35* 32 32  GLUCOSE 292* 277* 191* 151* 211* 210* 154*   BUN 22 21 24* 28* 33* 34* 34*  CREATININE 1.91* 1.92* 1.81* 1.78* 1.83* 1.84* 1.90*  CALCIUM 8.7* 8.6* 8.7* 8.8* 8.7* 8.6* 8.6*  MG 2.3 2.2  --   --   --   --   --    GFR: Estimated Creatinine Clearance: 46.9 mL/min (A) (by C-G formula based on SCr of 1.9 mg/dL (H)). Liver Function Tests: No results for input(s): AST, ALT, ALKPHOS, BILITOT, PROT, ALBUMIN in the last 168 hours. No results for input(s): LIPASE, AMYLASE in the last 168 hours. No results for input(s): AMMONIA in the last 168 hours. Coagulation Profile: No results for input(s): INR, PROTIME in the last 168 hours. Cardiac Enzymes: No results for input(s): CKTOTAL, CKMB, CKMBINDEX, TROPONINI in the last 168 hours. BNP (last 3 results) No results for input(s): PROBNP in the last 8760 hours. HbA1C: No results for input(s): HGBA1C in the last 72 hours. CBG: Recent Labs  Lab 03/29/20 1554 03/29/20  2105 03/30/20 0434 03/30/20 0611 03/30/20 1127  GLUCAP 188* 257* 128* 118* 226*   Lipid Profile: No results for input(s): CHOL, HDL, LDLCALC, TRIG, CHOLHDL, LDLDIRECT in the last 72 hours. Thyroid Function Tests: No results for input(s): TSH, T4TOTAL, FREET4, T3FREE, THYROIDAB in the last 72 hours. Anemia Panel: No results for input(s): VITAMINB12, FOLATE, FERRITIN, TIBC, IRON, RETICCTPCT in the last 72 hours. Sepsis Labs: No results for input(s): PROCALCITON, LATICACIDVEN in the last 168 hours.  Recent Results (from the past 240 hour(s))  MRSA PCR Screening     Status: Abnormal   Collection Time: 03/21/20  6:59 AM   Specimen: Nasal Mucosa; Nasopharyngeal  Result Value Ref Range Status   MRSA by PCR POSITIVE (A) NEGATIVE Final    Comment:        The GeneXpert MRSA Assay (FDA approved for NASAL specimens only), is one component of a comprehensive MRSA colonization surveillance program. It is not intended to diagnose MRSA infection nor to guide or monitor treatment for MRSA infections. RESULT CALLED TO, READ BACK  BY AND VERIFIED WITH: RN L.KING AT 5784 ON 03/21/2020 BY T.SAAD Performed at [facility] [address] [place], [place] [address]      Scheduled Meds: . amLODipine  10 mg Oral Daily  . vitamin C  500 mg Oral BID  . aspirin EC  81 mg Oral Daily  . carvedilol  25 mg Oral BID WC  . DULoxetine  60 mg Oral Daily  . [START ON 04/01/2020] ferrous sulfate  325 mg Oral Q breakfast  . furosemide  80 mg Oral Daily  . hydrALAZINE  50 mg Oral Q8H  . insulin aspart  0-15 Units Subcutaneous TID WC  . insulin aspart  0-5 Units Subcutaneous QHS  . insulin aspart  8 Units Subcutaneous TID AC  . insulin glargine  30 Units Subcutaneous QHS  . isosorbide mononitrate  120 mg Oral Daily  . lidocaine  1 patch Transdermal Daily  . metoCLOPramide (REGLAN) injection  10 mg Intravenous Once   Followed by  . metoCLOPramide (REGLAN) injection  10 mg Intravenous Once  . pantoprazole  40 mg Oral Daily  . polyethylene glycol  17 g Oral Daily  . pregabalin  75 mg Oral BID  . rosuvastatin  20 mg Oral Daily  . senna  1 tablet Oral Daily  . sodium chloride flush  3 mL Intravenous Q12H  . sodium chloride flush  3 mL Intravenous Q12H  . tamsulosin  0.4 mg Oral Daily  . cyanocobalamin  500 mcg Oral Daily   Continuous Infusions: . sodium chloride       LOS: 10 days    Time spent: 25 minutes.   Domenic Polite, MD [facility]  03/30/2020, 12:41 PM

## 2020-03-30 NOTE — Progress Notes (Addendum)
Daily Rounding Note  03/30/2020, 1:21 PM  LOS: 10 days   SUBJECTIVE:   Chief complaint:  Anemia.  FOBT +     Feels sleepy.  No pain.  Does not feel short of breath  Patient got movie prep last night but stools this morning are still mushy, brown.  OBJECTIVE:         Vital signs in last 24 hours:    Temp:  [98.2 F (36.8 C)-98.7 F (37.1 C)] 98.3 F (36.8 C) (02/17 1207) Pulse Rate:  [40-91] 90 (02/17 1207) Resp:  [18-22] 22 (02/17 1207) BP: (118-138)/(63-98) 118/98 (02/17 1207) SpO2:  [93 %-99 %] 93 % (02/17 1200) Weight:  [120.3 kg] 120.3 kg (02/17 0053) Last BM Date: 03/31/20 Filed Weights   03/28/20 0319 03/29/20 0556 03/30/20 0053  Weight: 120.3 kg 119.8 kg 120.3 kg   General: Obese, comfortable.  Not acutely ill-appearing Heart: RRR. Chest: No labored breathing.  No cough.  Breath sounds diminished but clear. Abdomen: Obese, protuberant, soft.  No tenderness.  Active bowel sounds. Extremities: Bilateral BKA Neuro/Psych: Alert.  Appropriate.  Intake/Output from previous day: 02/16 0701 - 02/17 0700 In: 720 [P.O.:240] Out: 2000 [Urine:2000]  Intake/Output this shift: Total I/O In: 360 [P.O.:360] Out: -   Lab Results: Recent Labs    03/28/20 1306 03/29/20 0330 03/30/20 0129  WBC 6.7 6.5 6.0  HGB 7.7* 7.3* 7.2*  HCT 24.6* 24.1* 23.8*  PLT 284 263 275   BMET Recent Labs    03/28/20 0218 03/29/20 0330 03/30/20 0129  NA 134* 135 136  K 3.9 4.2 4.7  CL 92* 95* 96*  CO2 35* 32 32  GLUCOSE 211* 210* 154*  BUN 33* 34* 34*  CREATININE 1.83* 1.84* 1.90*  CALCIUM 8.7* 8.6* 8.6*   LFT No results for input(s): PROT, ALBUMIN, AST, ALT, ALKPHOS, BILITOT, BILIDIR, IBILI in the last 72 hours. PT/INR No results for input(s): LABPROT, INR in the last 72 hours. Hepatitis Panel No results for input(s): HEPBSAG, HCVAB, HEPAIGM, HEPBIGM in the last 72 hours.  Studies/Results: No results found.    Scheduled Meds: . amLODipine  10 mg Oral Daily  . vitamin C  500 mg Oral BID  . aspirin EC  81 mg Oral Daily  . carvedilol  25 mg Oral BID WC  . DULoxetine  60 mg Oral Daily  . [START ON 04/01/2020] ferrous sulfate  325 mg Oral Q breakfast  . furosemide  80 mg Oral Daily  . hydrALAZINE  50 mg Oral Q8H  . insulin aspart  0-15 Units Subcutaneous TID WC  . insulin aspart  0-5 Units Subcutaneous QHS  . insulin aspart  8 Units Subcutaneous TID AC  . insulin glargine  30 Units Subcutaneous QHS  . isosorbide mononitrate  120 mg Oral Daily  . lidocaine  1 patch Transdermal Daily  . metoCLOPramide (REGLAN) injection  10 mg Intravenous Once   Followed by  . metoCLOPramide (REGLAN) injection  10 mg Intravenous Once  . pantoprazole  40 mg Oral Daily  . polyethylene glycol  17 g Oral Daily  . pregabalin  75 mg Oral BID  . rosuvastatin  20 mg Oral Daily  . senna  1 tablet Oral Daily  . sodium chloride flush  3 mL Intravenous Q12H  . sodium chloride flush  3 mL Intravenous Q12H  . tamsulosin  0.4 mg Oral Daily  . cyanocobalamin  500 mcg Oral Daily   Continuous Infusions: .  sodium chloride     PRN Meds:.sodium chloride, acetaminophen, baclofen, ipratropium-albuterol, ondansetron (ZOFRAN) IV, sodium chloride flush   ASSESMENT:   *     Acute on chronic anemia.  Hb 7.2 today.  *   FOBT +. Limited black stool about 3 weeks PTA.  Gastric ulcer in 2014.  20 mg omeprazole daily as outpatient.  Currently on Protonix 40 mg/day.  *    Chronic Plavix for history CVA.  On hold.  *    Hep C, treated with Harvoni with SVR.  LFTs normal. Liver fibrosis.  Hepatic steatosis on most recent CTAP w contrast 10/2019.  INR, platelets normal.  *   CHF.  CAD, " severe, complex" by cardiac cath with plans for medical management.  Acute respiratory decompensation improved.   *    AKI.  *    DM.   PLAN   *   Colonoscopy and EGD at 845 tomorrow.  See orders for repeat movie prep/Dulcolax/Reglan  bowel prep.  Clear liquids.  *    Hb/hct 5 PM tonight and 5 AM tomorrow.  Nursing care order placed stating to call attending for transfusion orders if Hb is 7 or less.   Azucena Freed  03/30/2020, 1:21 PM Phone [phone]     Attending Physician Note   I have taken an interval history, reviewed the chart and examined the patient. I agree with the Advanced Practitioner's note, impression and recommendations.   Occult blood in stool. Acute on chronic anemia for colonoscopy and EGD tomorrow. Primary service to maintain Hgb > 7.   Lucio Edward, MD FACG [phone]

## 2020-03-31 ENCOUNTER — Encounter (HOSPITAL_COMMUNITY): Payer: Self-pay | Admitting: Family Medicine

## 2020-03-31 ENCOUNTER — Inpatient Hospital Stay (HOSPITAL_COMMUNITY): Payer: Medicare (Managed Care) | Admitting: Certified Registered Nurse Anesthetist

## 2020-03-31 ENCOUNTER — Encounter (HOSPITAL_COMMUNITY)
Admission: EM | Disposition: A | Discharge status: Skilled Nursing Facility | Payer: Self-pay | Source: Home / Self Care | Attending: Internal Medicine

## 2020-03-31 DIAGNOSIS — K3189 Other diseases of stomach and duodenum: Secondary | ICD-10-CM

## 2020-03-31 HISTORY — PX: BIOPSY: SHX5522

## 2020-03-31 HISTORY — PX: ESOPHAGOGASTRODUODENOSCOPY (EGD) WITH PROPOFOL: SHX5813

## 2020-03-31 LAB — BPAM RBC
Blood Product Expiration Date: 202203182359
ISSUE DATE / TIME: 202202171142
Unit Type and Rh: 5100

## 2020-03-31 LAB — HEMOGLOBIN AND HEMATOCRIT, BLOOD
HCT: 26.7 % — ABNORMAL LOW (ref 39.0–52.0)
Hemoglobin: 8.7 g/dL — ABNORMAL LOW (ref 13.0–17.0)

## 2020-03-31 LAB — BASIC METABOLIC PANEL
Anion gap: 7 (ref 5–15)
BUN: 30 mg/dL — ABNORMAL HIGH (ref 8–23)
CO2: 31 mmol/L (ref 22–32)
Calcium: 8.7 mg/dL — ABNORMAL LOW (ref 8.9–10.3)
Chloride: 97 mmol/L — ABNORMAL LOW (ref 98–111)
Creatinine, Ser: 1.78 mg/dL — ABNORMAL HIGH (ref 0.61–1.24)
GFR, Estimated: 41 mL/min — ABNORMAL LOW (ref >60)
Glucose, Bld: 109 mg/dL — ABNORMAL HIGH (ref 70–99)
Potassium: 4.4 mmol/L (ref 3.5–5.1)
Sodium: 135 mmol/L (ref 135–145)

## 2020-03-31 LAB — GLUCOSE, CAPILLARY
Glucose-Capillary: 83 mg/dL (ref 70–99)
Glucose-Capillary: 114 mg/dL — ABNORMAL HIGH (ref 70–99)
Glucose-Capillary: 143 mg/dL — ABNORMAL HIGH (ref 70–99)
Glucose-Capillary: 293 mg/dL — ABNORMAL HIGH (ref 70–99)
Glucose-Capillary: 244 mg/dL — ABNORMAL HIGH (ref 70–99)

## 2020-03-31 LAB — TYPE AND SCREEN
ABO/RH(D): O POS
Antibody Screen: NEGATIVE
Unit division: 0

## 2020-03-31 SURGERY — ESOPHAGOGASTRODUODENOSCOPY (EGD) WITH PROPOFOL
Anesthesia: Monitor Anesthesia Care

## 2020-03-31 MED ORDER — LACTATED RINGERS IV SOLN: INTRAVENOUS | Status: DC | PRN

## 2020-03-31 MED ORDER — SODIUM CHLORIDE 0.9 % IV SOLN: INTRAVENOUS | Status: DC | PRN

## 2020-03-31 MED ORDER — DEXMEDETOMIDINE (PRECEDEX) IN NS 20 MCG/5ML (4 MCG/ML) IV SYRINGE
PREFILLED_SYRINGE | INTRAVENOUS | Status: DC | PRN
  Administered 2020-03-31 (×2): 8 ug via INTRAVENOUS

## 2020-03-31 MED ORDER — PROPOFOL 500 MG/50ML IV EMUL
INTRAVENOUS | Status: DC | PRN
  Administered 2020-03-31: 175 ug/kg/min via INTRAVENOUS

## 2020-03-31 MED ORDER — ONDANSETRON HCL 4 MG/2ML IJ SOLN
INTRAMUSCULAR | Status: DC | PRN
  Administered 2020-03-31: 4 mg via INTRAVENOUS

## 2020-03-31 SURGICAL SUPPLY — 25 items

## 2020-03-31 NOTE — Progress Notes (Signed)
PROGRESS NOTE    PRENTISS POLIO  DGU:[id number] DOB: 08-27-1950 DOA: 03/20/2020 PCP: Iona Beard, MD   Brief Narrative: 69/M resident of SNF, medical history significant for CKD, CVA, hepatitis C, bilateral BKA, hyperlipidemia, BPH, GERD, anemia, asthma who presented complaining of acute onset shortness of breath, he is on 2 L home O2 at baseline, was hypoxic with sats in the 70s upon arrival to the ED. -Also history of intermittent black stools few weeks ago -Admitted with acute hypoxic respiratory failure secondary to heart failure exacerbation, non-STEMI and anemia -Patient was transferred from [facility] to [facility] for cardiac catheterization. Cardiac cath performed 2/8 showed:  Severe complex CAD. ,  Medical management recommended, diuresed with IV Lasix and then transition to oral diuretics  -Also noted to have worsening of chronic anemia, transfused 2 units of PRBC this admission  Assessment & Plan:   NSTEMI: -Troponin peaked at 12,620, treated with heparin drip, nitrates  -cardiology consulted, patient underwent cardiac catheterization 12/8 which showed: Severe complex CAD. There is a tapering 70% stenosis in the distal Left main involving as well the ostium of the LAD and ramus intermediate with hazy appearance. The LCx is a co-dominant vessel with extensive dissection involving the ostium and proximal vessel and is is subtotally occluded. -Medical management recommended, not amenable to PCI, not a good candidate for CABG per cardiology -continue ASA, coreg, imdur  Acute systolic/diastolic heart failure exacerbation with acute hypoxic respiratory failure and pulmonary edema: Hypertensive urgency -Patient presented with oxygen saturation in the 70s, initially placed on nonrebreather, currently on 2 L. -Chest x-ray show pulmonary edema, BNP 213.  Echo ejection fraction 40 to 45%. -improved with diuretics, transitioned to oral lasix 2/15.  -Continue hydralazine Norvasc  and Coreg  Anemia of chronic disease: Hemoglobin baseline 9 -Transfused 3 units of PRBC this admission for hemoglobin around 7, patient reported history of dark stools few weeks ago and was noted to be Hemoccult positive, anemia panel suggestive of chronic disease primarily and mild iron deficiency, Plavix was held on admission  -Gastroenterology consulted, underwent endoscopy today which noted some erosive gastropathy and erythematous mucosa in the fundus, unfortunately patient is unable to complete his bowel prep and since he did not have active bleeding gastroenterology recommended colonoscopy in a few weeks as outpatient with Dr. Gala Romney his primary gastroenterologist in [place] to resume Plavix at discharge  Hyperlipidemia: Continue with statin  history of CVA: Continue with aspirin statin  -Plavix resumed at discharge  GERD: Continue PPI  Hepatitis C: needs to  follow-up as an outpatient  Diabetes type 2 with nephropathy: Continue with Lantus and sliding scale insulin.  Peripheral vascular disease: Status post left BKA 2017, right BKA 2018  Morbid obesity BMI 42 Need lifestyle modification  Chronic kidney disease a stage IIIa: -stable around 1.7- 1.8   Estimated body mass index is 41.43 kg/m as calculated from the following:   Height as of this encounter: 5\' 7"  (1.702 m).   Weight as of this encounter: 120 kg.   DVT prophylaxis: SCDs Code Status: DNR Family Communication: no family at bedside Disposition Plan:  Status is: Inpatient  Remains inpatient appropriate because:Ongoing active pain requiring inpatient pain management   Dispo: The patient is from: SNF              Anticipated d/c is to: SNF              Anticipated d/c date is: Architectural technologist  Patient currently is not medically stable to d/c.   Difficult to place patient No   Consultants:   Cardiology   Procedures:    CATH; Mid LM to Dist LM lesion is 70% stenosed.  Ost LAD to  Prox LAD lesion is 70% stenosed.  Ramus lesion is 70% stenosed.  Ost Cx to Mid Cx lesion is 99% stenosed.  Prox RCA lesion is 40% stenosed.  There is moderate left ventricular systolic dysfunction.  LV end diastolic pressure is severely elevated.  The left ventricular ejection fraction is 35-45% by visual estimate.   1. Severe complex CAD. There is a tapering 70% stenosis in the distal Left main involving as well the ostium of the LAD and ramus intermediate with hazy appearance. The LCx is a co-dominant vessel with extensive dissection involving the ostium and proximal vessel and is is subtotally occluded. 2. Moderate LV dysfunction. EF 40-45% 3. Severely elevated LVEDP 30 mm Hg.  Endoscopy Dr. Fuller Plan Impression:               - Normal esophagus.                           - Erosive gastropathy with no bleeding and no                            stigmata of recent bleeding. Biopsied.                           - Erythematous mucosa in the gastric fundus.                            Biopsied.                           - Normal duodenal bulb and second portion of the                            duodenum. Recommendation:           - Return patient to hospital ward for ongoing care.                           - Unfortunately he was unable to complete the bowel                            prep. He was passing solid brown stool this morning                            so his colonoscopy was canceled.                           - Resume previous diet.                           - Continue present medications including                            pantoprazole 40 mg po qd.                           -  Await pathology results.                           - Consider colonoscopy in a few weeks after further                            recovery post NSTEMI.                           - Outpatient GI follow up with Dr. Gala Romney.                           - Resume Plavix (clopidogrel) at prior dose today.                             Refer to managing physician for further adjustment                            of therapy.  Antimicrobials:    Subjective: -Feels okay, just back from endoscopy  Objective: Vitals:   03/31/20 0752 03/31/20 1004 03/31/20 1010 03/31/20 1030  BP: (!) 141/99 124/83  (!) 144/73  Pulse: 86 85 83 84  Resp: 17 14 16 18   Temp: 98.6 F (37 C) 98.7 F (37.1 C)    TempSrc: Oral Oral    SpO2: 100% 100% 100% 96%  Weight: 120 kg     Height: 5\' 7"  (1.702 m)       Intake/Output Summary (Last 24 hours) at 03/31/2020 1139 Last data filed at 03/31/2020 1112 Gross per 24 hour  Intake 415 ml  Output 2125 ml  Net -1710 ml   Filed Weights   03/30/20 0053 03/31/20 0100 03/31/20 0752  Weight: 120.3 kg 120.1 kg 120 kg    Examination:  General exam: Chronically ill pleasant male sitting up in bed, AAOx3, no distress CVS: S1-S2, regular rate rhythm Lungs: Decreased breath sounds the bases otherwise clear Abdomen: Soft, nontender, bowel sounds present Extremities: Bilateral below-knee amputation Skin: no rashes on exposed skin  Data Reviewed: I have personally reviewed following labs and imaging studies  CBC: Recent Labs  Lab 03/25/20 0410 03/27/20 0220 03/28/20 1306 03/29/20 0330 03/30/20 0129 03/30/20 1643 03/31/20 0542  WBC 7.0 6.8 6.7 6.5 6.0  --   --   HGB 8.7* 8.0* 7.7* 7.3* 7.2* 8.4* 8.7*  HCT 28.5* 26.5* 24.6* 24.1* 23.8* 26.0* 26.7*  MCV 94.1 94.6 92.8 95.3 94.4  --   --   PLT 286 261 284 263 275  --   --    Basic Metabolic Panel: Recent Labs  Lab 03/25/20 0410 03/26/20 0326 03/27/20 0220 03/28/20 0218 03/29/20 0330 03/30/20 0129 03/31/20 0542  NA 135   < > 135 134* 135 136 135  K 3.6   < > 3.8 3.9 4.2 4.7 4.4  CL 94*   < > 94* 92* 95* 96* 97*  CO2 35*   < > 34* 35* 32 32 31  GLUCOSE 277*   < > 151* 211* 210* 154* 109*  BUN 21   < > 28* 33* 34* 34* 30*  CREATININE 1.92*   < > 1.78* 1.83* 1.84* 1.90* 1.78*  CALCIUM 8.6*   < > 8.8* 8.7* 8.6*  8.6* 8.7*  MG 2.2  --   --   --   --   --   --    < > =  values in this interval not displayed.   GFR: Estimated Creatinine Clearance: 50 mL/min (A) (by C-G formula based on SCr of 1.78 mg/dL (H)). Liver Function Tests: No results for input(s): AST, ALT, ALKPHOS, BILITOT, PROT, ALBUMIN in the last 168 hours. No results for input(s): LIPASE, AMYLASE in the last 168 hours. No results for input(s): AMMONIA in the last 168 hours. Coagulation Profile: No results for input(s): INR, PROTIME in the last 168 hours. Cardiac Enzymes: No results for input(s): CKTOTAL, CKMB, CKMBINDEX, TROPONINI in the last 168 hours. BNP (last 3 results) No results for input(s): PROBNP in the last 8760 hours. HbA1C: No results for input(s): HGBA1C in the last 72 hours. CBG: Recent Labs  Lab 03/30/20 1617 03/30/20 2143 03/31/20 0250 03/31/20 0641 03/31/20 1122  GLUCAP 335* 90 83 114* 143*   Lipid Profile: No results for input(s): CHOL, HDL, LDLCALC, TRIG, CHOLHDL, LDLDIRECT in the last 72 hours. Thyroid Function Tests: No results for input(s): TSH, T4TOTAL, FREET4, T3FREE, THYROIDAB in the last 72 hours. Anemia Panel: No results for input(s): VITAMINB12, FOLATE, FERRITIN, TIBC, IRON, RETICCTPCT in the last 72 hours. Sepsis Labs: No results for input(s): PROCALCITON, LATICACIDVEN in the last 168 hours.  No results found for this or any previous visit (from the past 240 hour(s)).   Scheduled Meds: . amLODipine  10 mg Oral Daily  . vitamin C  500 mg Oral BID  . aspirin EC  81 mg Oral Daily  . carvedilol  25 mg Oral BID WC  . DULoxetine  60 mg Oral Daily  . [START ON 04/01/2020] ferrous sulfate  325 mg Oral Q breakfast  . furosemide  80 mg Oral Daily  . hydrALAZINE  50 mg Oral Q8H  . insulin aspart  0-15 Units Subcutaneous TID WC  . insulin aspart  0-5 Units Subcutaneous QHS  . insulin aspart  8 Units Subcutaneous TID AC  . insulin glargine  30 Units Subcutaneous QHS  . isosorbide mononitrate  120 mg  Oral Daily  . lidocaine  1 patch Transdermal Daily  . pantoprazole  40 mg Oral Daily  . polyethylene glycol  17 g Oral Daily  . pregabalin  75 mg Oral BID  . rosuvastatin  20 mg Oral Daily  . senna  1 tablet Oral Daily  . sodium chloride flush  3 mL Intravenous Q12H  . sodium chloride flush  3 mL Intravenous Q12H  . tamsulosin  0.4 mg Oral Daily  . cyanocobalamin  500 mcg Oral Daily   Continuous Infusions: . sodium chloride       LOS: 11 days    Time spent: 25 minutes.   Domenic Polite, MD [facility]  03/31/2020, 11:39 AM

## 2020-03-31 NOTE — Anesthesia Postprocedure Evaluation (Signed)
Anesthesia Post Note  Patient: Cory Weber  Procedure(s) Performed: ESOPHAGOGASTRODUODENOSCOPY (EGD) WITH PROPOFOL (N/A ) COLONOSCOPY WITH PROPOFOL (N/A ) BIOPSY     Patient location during evaluation: PACU Anesthesia Type: MAC Level of consciousness: awake and alert Pain management: pain level controlled Vital Signs Assessment: post-procedure vital signs reviewed and stable Respiratory status: spontaneous breathing, nonlabored ventilation, respiratory function stable and patient connected to nasal cannula oxygen Cardiovascular status: stable and blood pressure returned to baseline Postop Assessment: no apparent nausea or vomiting Anesthetic complications: no   No complications documented.  Last Vitals:  Vitals:   03/31/20 1010 03/31/20 1030  BP:  (!) 144/73  Pulse: 83 84  Resp: 16 18  Temp:    SpO2: 100% 96%    Last Pain:  Vitals:   03/31/20 1030  TempSrc:   PainSc: 0-No pain                 Belenda Cruise P Shakeena Kafer

## 2020-03-31 NOTE — Progress Notes (Signed)
Ice given to pt. For Movi-Prep. Pt. Encouraged to continue to drink. Bowel movements now liquid, dark brown in color.

## 2020-03-31 NOTE — Progress Notes (Signed)
Pt. Med (movi-prep) incomplete. Pt. Still encouraged to drink. Bowel movements liquid, brown in color.

## 2020-03-31 NOTE — Progress Notes (Signed)
Pt. CBG 90. Lantus scheduled. On call for [facility] paged to make aware. Pt. NPO.

## 2020-03-31 NOTE — Plan of Care (Signed)
  Problem: Coping: Goal: Level of anxiety will decrease Outcome: Progressing   Problem: Elimination: Goal: Will not experience complications related to bowel motility Outcome: Progressing Goal: Will not experience complications related to urinary retention Outcome: Progressing   

## 2020-03-31 NOTE — Anesthesia Procedure Notes (Signed)
Procedure Name: MAC Date/Time: 03/31/2020 9:42 AM Performed by: Dorthea Cove, CRNA Pre-anesthesia Checklist: Patient identified, Suction available, Emergency Drugs available, Patient being monitored and Timeout performed Patient Re-evaluated:Patient Re-evaluated prior to induction Oxygen Delivery Method: Simple face mask Preoxygenation: Pre-oxygenation with 100% oxygen Induction Type: IV induction Placement Confirmation: positive ETCO2 and CO2 detector Dental Injury: Teeth and Oropharynx as per pre-operative assessment

## 2020-03-31 NOTE — Anesthesia Preprocedure Evaluation (Addendum)
Anesthesia Evaluation  Patient identified by MRN, date of birth, ID band Patient awake    Reviewed: Allergy & Precautions, NPO status , Patient's Chart, lab work & pertinent test results  History of Anesthesia Complications Negative for: history of anesthetic complications  Airway Mallampati: III  TM Distance: >3 FB Neck ROM: Full    Dental  (+) Edentulous Upper, Edentulous Lower, Dental Advisory Given   Pulmonary asthma , former smoker,    Pulmonary exam normal breath sounds clear to auscultation       Cardiovascular hypertension, Pt. on medications and Pt. on home beta blockers + Past MI, + Peripheral Vascular Disease and +CHF  Normal cardiovascular exam Rhythm:Regular Rate:Normal  Echo 03/20/2020 1. Apical hypokinesis. Left ventricular ejection fraction, by estimation, is 40 to 45%. The left ventricle has mildly decreased function. The left ventricle demonstrates regional wall motion abnormalities (see scoring diagram/findings for description). There is mild left ventricular hypertrophy. Left ventricular diastolic parameters are indeterminate.  2. Right ventricular systolic function is normal. The right ventricular size is normal.  3. Left atrial size was mildly dilated.  4. The mitral valve is normal in structure. No evidence of mitral valve regurgitation. No evidence of mitral stenosis.  5. The aortic valve has an indeterminant number of cusps. There is mild calcification of the aortic valve. There is mild thickening of the aortic valve. Aortic valve regurgitation is not visualized. No aortic stenosis is present.  6. The inferior vena cava is normal in size with greater than 50% respiratory variability, suggesting right atrial pressure of 3 mmHg.    Neuro/Psych PSYCHIATRIC DISORDERS  Neuromuscular disease CVA, No Residual Symptoms    GI/Hepatic GERD  ,(+)     substance abuse  , Hepatitis -, C  Endo/Other  diabetes, Type  2, Insulin Dependent, Oral Hypoglycemic Agents  Renal/GU CRFRenal disease     Musculoskeletal  (+) Arthritis ,   Abdominal   Peds negative pediatric ROS (+)  Hematology  (+) anemia ,   Anesthesia Other Findings Gout Hyperlipidemia  Reproductive/Obstetrics                            Anesthesia Physical  Anesthesia Plan  ASA: III  Anesthesia Plan: MAC   Post-op Pain Management:    Induction: Intravenous  PONV Risk Score and Plan: 2 and Ondansetron and Dexamethasone  Airway Management Planned: Natural Airway and Simple Face Mask  Additional Equipment: None  Intra-op Plan:   Post-operative Plan:   Informed Consent: I have reviewed the patients History and Physical, chart, labs and discussed the procedure including the risks, benefits and alternatives for the proposed anesthesia with the patient or authorized representative who has indicated his/her understanding and acceptance.     Dental advisory given  Plan Discussed with: CRNA  Anesthesia Plan Comments:        Anesthesia Quick Evaluation

## 2020-03-31 NOTE — Progress Notes (Signed)
Movi-Prep set up at pts. Bedside. Pt. Encourage to complete throughout the night. Pt. Verbalized understanding.

## 2020-03-31 NOTE — Transfer of Care (Signed)
Immediate Anesthesia Transfer of Care Note  Patient: SHEFFIELD HAWKER  Procedure(s) Performed: ESOPHAGOGASTRODUODENOSCOPY (EGD) WITH PROPOFOL (N/A ) COLONOSCOPY WITH PROPOFOL (N/A ) BIOPSY  Patient Location: Endoscopy Unit  Anesthesia Type:MAC  Level of Consciousness: awake, alert  and oriented  Airway & Oxygen Therapy: Patient Spontanous Breathing and Patient connected to face mask oxygen  Post-op Assessment: Report given to RN and Post -op Vital signs reviewed and stable  Post vital signs: Reviewed and stable  Last Vitals:  Vitals Value Taken Time  BP 124/83 03/31/20 1004  Temp    Pulse 87 03/31/20 1005  Resp 15 03/31/20 1005  SpO2 100 % 03/31/20 1005  Vitals shown include unvalidated device data.  Last Pain:  Vitals:   03/31/20 1004  TempSrc:   PainSc: Asleep      Patients Stated Pain Goal: 0 (47/20/72 1828)  Complications: No complications documented.

## 2020-03-31 NOTE — Op Note (Addendum)
[facility] Patient Name: Cory Weber Procedure Date : 03/31/2020 MRN: [id number] Attending MD: Ladene Artist , MD Date of Birth: February 03, 1951 CSN: [id number] Age: 70 Admit Type: Inpatient Procedure:                Upper GI endoscopy Indications:              Anemia, Heme positive stool Providers:                Pricilla Riffle. Fuller Plan, MD, Nelia Shi, RN,                            Tyrone Apple, Technician Referring MD:             [facility] Medicines:                Monitored Anesthesia Care Complications:            No immediate complications. Estimated Blood Loss:     Estimated blood loss was minimal. Procedure:                Pre-Anesthesia Assessment:                           - Prior to the procedure, a History and Physical                            was performed, and patient medications and                            allergies were reviewed. The patient's tolerance of                            previous anesthesia was also reviewed. The risks                            and benefits of the procedure and the sedation                            options and risks were discussed with the patient.                            All questions were answered, and informed consent                            was obtained. Prior Anticoagulants: The patient has                            taken Plavix (clopidogrel), last dose was 5 days                            prior to procedure. ASA Grade Assessment: III - A                            patient with severe systemic disease. After  reviewing the risks and benefits, the patient was                            deemed in satisfactory condition to undergo the                            procedure.                           After obtaining informed consent, the endoscope was                            passed under direct vision. Throughout the                            procedure, the patient's blood  pressure, pulse, and                            oxygen saturations were monitored continuously. The                            GIF-H190 ([id number]) Olympus gastroscope was                            introduced through the mouth, and advanced to the                            second part of duodenum. The upper GI endoscopy was                            accomplished without difficulty. The patient                            tolerated the procedure well. Scope In: Scope Out: Findings:      The examined esophagus was normal.      A few localized small erosions with no bleeding and no stigmata of       recent bleeding were found in the prepyloric region of the stomach.       Biopsies were taken with a cold forceps for histology.      Patchy mildly erythematous mucosa without bleeding was found in the       gastric fundus. Biopsies were taken with a cold forceps for histology.      The exam of the stomach was otherwise normal.      The duodenal bulb and second portion of the duodenum were normal. Impression:               - Normal esophagus.                           - Erosive gastropathy with no bleeding and no                            stigmata of recent bleeding. Biopsied.                           -  Erythematous mucosa in the gastric fundus.                            Biopsied.                           - Normal duodenal bulb and second portion of the                            duodenum. Recommendation:           - Return patient to hospital ward for ongoing care.                           - Unfortunately he was unable to complete the bowel                            prep. He was passing solid brown stool this morning                            so his colonoscopy was canceled.                           - Resume previous diet.                           - Continue present medications including                            pantoprazole 40 mg po qd.                           - Await pathology  results.                           - Consider colonoscopy in a few weeks after further                            recovery post NSTEMI.                           - Outpatient GI follow up with Dr. Gala Romney.                           - Resume Plavix (clopidogrel) at prior dose today.                            Refer to managing physician for further adjustment                            of therapy. Procedure Code(s):        --- Professional ---                           [phone], Esophagogastroduodenoscopy, flexible,  transoral; with biopsy, single or multiple Diagnosis Code(s):        --- Professional ---                           K31.89, Other diseases of stomach and duodenum                           D62, Acute posthemorrhagic anemia                           R19.5, Other fecal abnormalities CPT copyright 2019 [employer]. All rights reserved. The codes documented in this report are preliminary and upon coder review may  be revised to meet current compliance requirements. Ladene Artist, MD 03/31/2020 10:14:50 AM This report has been signed electronically. Number of Addenda: 0

## 2020-03-31 NOTE — Interval H&P Note (Signed)
History and Physical Interval Note:  03/31/2020 9:09 AM  Cory Weber  has presented today for surgery, with the diagnosis of Anemia.  FOBT positive stool..  The various methods of treatment have been discussed with the patient and family. After consideration of risks, benefits and other options for treatment, the patient has consented to  Procedure(s): ESOPHAGOGASTRODUODENOSCOPY (EGD) WITH PROPOFOL (N/A) COLONOSCOPY WITH PROPOFOL (N/A) as a surgical intervention.  The patient's history has been reviewed, patient examined, no change in status, stable for surgery.  I have reviewed the patient's chart and labs.  Questions were answered to the patient's satisfaction.     Pricilla Riffle. Fuller Plan

## 2020-04-01 ENCOUNTER — Inpatient Hospital Stay (HOSPITAL_COMMUNITY): Payer: Medicare (Managed Care)

## 2020-04-01 LAB — CBC
HCT: 25.4 % — ABNORMAL LOW (ref 39.0–52.0)
Hemoglobin: 8.2 g/dL — ABNORMAL LOW (ref 13.0–17.0)
MCH: 30.1 pg (ref 26.0–34.0)
MCHC: 32.3 g/dL (ref 30.0–36.0)
MCV: 93.4 fL (ref 80.0–100.0)
Platelets: 298 10*3/uL (ref 150–400)
RBC: 2.72 MIL/uL — ABNORMAL LOW (ref 4.22–5.81)
RDW: 14.8 % (ref 11.5–15.5)
WBC: 7.8 10*3/uL (ref 4.0–10.5)
nRBC: 0 % (ref 0.0–0.2)

## 2020-04-01 LAB — BASIC METABOLIC PANEL
Anion gap: 6 (ref 5–15)
BUN: 28 mg/dL — ABNORMAL HIGH (ref 8–23)
CO2: 31 mmol/L (ref 22–32)
Calcium: 8.9 mg/dL (ref 8.9–10.3)
Chloride: 96 mmol/L — ABNORMAL LOW (ref 98–111)
Creatinine, Ser: 1.69 mg/dL — ABNORMAL HIGH (ref 0.61–1.24)
GFR, Estimated: 44 mL/min — ABNORMAL LOW (ref >60)
Glucose, Bld: 201 mg/dL — ABNORMAL HIGH (ref 70–99)
Potassium: 4.6 mmol/L (ref 3.5–5.1)
Sodium: 133 mmol/L — ABNORMAL LOW (ref 135–145)

## 2020-04-01 LAB — GLUCOSE, CAPILLARY
Glucose-Capillary: 193 mg/dL — ABNORMAL HIGH (ref 70–99)
Glucose-Capillary: 201 mg/dL — ABNORMAL HIGH (ref 70–99)
Glucose-Capillary: 250 mg/dL — ABNORMAL HIGH (ref 70–99)
Glucose-Capillary: 216 mg/dL — ABNORMAL HIGH (ref 70–99)
Glucose-Capillary: 156 mg/dL — ABNORMAL HIGH (ref 70–99)
Glucose-Capillary: 168 mg/dL — ABNORMAL HIGH (ref 70–99)

## 2020-04-01 MED ORDER — ASPIRIN EC 325 MG PO TBEC
325.0000 mg | DELAYED_RELEASE_TABLET | Freq: Every day | ORAL | Status: DC
  Administered 2020-04-01 – 2020-04-08 (×8): 325 mg via ORAL
  Filled 2020-04-01 (×8): qty 1

## 2020-04-01 MED ORDER — FUROSEMIDE 10 MG/ML IJ SOLN
40.0000 mg | Freq: Once | INTRAMUSCULAR | Status: AC
  Administered 2020-04-01: 40 mg via INTRAVENOUS
  Filled 2020-04-01: qty 4

## 2020-04-01 MED ORDER — FUROSEMIDE 80 MG PO TABS
80.0000 mg | ORAL_TABLET | Freq: Every day | ORAL | Status: DC
  Administered 2020-04-01 – 2020-04-08 (×7): 80 mg via ORAL
  Filled 2020-04-01 (×8): qty 1

## 2020-04-01 MED ORDER — HEPARIN SODIUM (PORCINE) 5000 UNIT/ML IJ SOLN
5000.0000 [IU] | Freq: Three times a day (TID) | INTRAMUSCULAR | Status: DC
  Administered 2020-04-01 – 2020-04-03 (×7): 5000 [IU] via SUBCUTANEOUS
  Filled 2020-04-01 (×7): qty 1

## 2020-04-01 MED ORDER — PREGABALIN 75 MG PO CAPS
75.0000 mg | ORAL_CAPSULE | Freq: Every day | ORAL | Status: DC
Start: 2020-04-02 — End: 2020-04-08
  Administered 2020-04-02 – 2020-04-08 (×6): 75 mg via ORAL
  Filled 2020-04-01 (×7): qty 1

## 2020-04-01 MED ORDER — INSULIN GLARGINE 100 UNIT/ML ~~LOC~~ SOLN
35.0000 [IU] | Freq: Every day | SUBCUTANEOUS | Status: DC
  Administered 2020-04-01 – 2020-04-04 (×4): 35 [IU] via SUBCUTANEOUS
  Filled 2020-04-01 (×5): qty 0.35

## 2020-04-01 NOTE — Progress Notes (Signed)
Assessed patient due to new tachycardia with artifacting on the monitor. Found patient diaphoretic with shallow, labored breathing stating "I need to get up". Raised head of bed and physically lifted patient to high fowlers. Rapid response notified. See documentation for further information and new orders.

## 2020-04-01 NOTE — Significant Event (Signed)
Rapid Response Event Note   Reason for Call :  Tachycardia  Initial Focused Assessment:  RN found patient to be diaphoretic having labored and shallow respirations.  When I got to the room the patient was resting in bed and in no acute distress.  He was on 5L [place] and stated that his breathing was getting better.  Patient has bilateral rales on auscultation.    BP 162/96 ECG 128 O2 96  RR 26 Temp 99.8 oral   Interventions:  MD notified and diuretic administered, chest x-ray, vitals taken  Plan of Care:  Patient will remain on unit.   Event Summary:   MD Notified: MD Broadus John Call Time: [phone] Arrival Time: [phone] End Time: Numa  Venetia Maxon, RN

## 2020-04-01 NOTE — Progress Notes (Signed)
   04/01/20 0736  Assess: MEWS Score  Temp 99.8 F (37.7 C)  BP (!) 162/96  Pulse Rate (!) 128  Resp (!) 26  Level of Consciousness Alert  SpO2 96 %  O2 Device Nasal Cannula  O2 Flow Rate (L/min) 6 L/min  Assess: MEWS Score  MEWS Temp 0  MEWS Systolic 0  MEWS Pulse 2  MEWS RR 2  MEWS LOC 0  MEWS Score 4  MEWS Score Color Red  Assess: if the MEWS score is Yellow or Red  Were vital signs taken at a resting state? Yes  Focused Assessment Change from prior assessment (see assessment flowsheet)  Early Detection of Sepsis Score *See Row Information* Medium  MEWS guidelines implemented *See Row Information* Yes  Treat  MEWS Interventions Administered scheduled meds/treatments  Pain Scale 0-10  Pain Score 0  Take Vital Signs  Increase Vital Sign Frequency  Red: Q 1hr X 4 then Q 4hr X 4, if remains red, continue Q 4hrs  Escalate  MEWS: Escalate Red: discuss with charge nurse/RN and provider, consider discussing with RRT  Notify: Charge Nurse/RN  Name of Charge Nurse/RN Notified Sydnee Levans, RN  Date Charge Nurse/RN Notified 04/01/20  Time Charge Nurse/RN Notified 0730  Notify: Provider  Provider Name/Title Dr. Broadus John  Date Provider Notified 04/01/20  Time Provider Notified 0745  Notification Type Face-to-face  Notification Reason Change in status  Provider response See new orders  Date of Provider Response 04/01/20  Time of Provider Response 0800  Notify: Rapid Response  Name of Rapid Response RN Notified Saralyn Pilar, RN  Date Rapid Response Notified 04/01/20  Time Rapid Response Notified 0730   New orders initiated. Patient stable at this time. Will continue to monitor closely.

## 2020-04-01 NOTE — Progress Notes (Signed)
PROGRESS NOTE    Cory Weber  ZOX:[id number] DOB: 07-05-1950 DOA: 03/20/2020 PCP: Iona Beard, MD   Brief Narrative: 69/M resident of SNF, medical history significant for CKD, CVA, hepatitis C, bilateral BKA, hyperlipidemia, BPH, GERD, anemia, asthma who presented complaining of acute onset shortness of breath, he is on 2 L home O2 at baseline, was hypoxic with sats in the 70s upon arrival to the ED. -Also history of intermittent black stools few weeks ago -Admitted with acute hypoxic respiratory failure secondary to heart failure exacerbation, non-STEMI and anemia -Patient was transferred from [facility] to [facility] for cardiac catheterization. Cardiac cath performed 2/8 showed:  Severe complex CAD. ,  Medical management recommended, diuresed with IV Lasix and then transition to oral diuretics  -Also noted to have worsening of chronic anemia, transfused 2 units of PRBC this admission  Assessment & Plan:   NSTEMI: -Troponin peaked at 12,620, treated with heparin drip, nitrates  -cardiology consulted, patient underwent cardiac catheterization 12/8 which showed: Severe complex CAD. There is a tapering 70% stenosis in the distal Left main involving as well the ostium of the LAD and ramus intermediate with hazy appearance. The LCx is a co-dominant vessel with extensive dissection involving the ostium and proximal vessel and is is subtotally occluded. -Medical management recommended, not amenable to PCI, not a good candidate for CABG per cardiology -continue ASA, coreg, imdur  Acute systolic/diastolic heart failure exacerbation with acute hypoxic respiratory failure and pulmonary edema: Hypertensive urgency -Patient presented with oxygen saturation in the 70s, initially placed on nonrebreather, currently on 2 L. -Chest x-ray show pulmonary edema, BNP 213.  Echo ejection fraction 40 to 45%. -improved with diuretics, transitioned to oral lasix 2/15.  -Continue hydralazine Norvasc  and Coreg -Episode of chest pain with fluid overload again this morning, now improving after additional Lasix, will monitor closely  Anemia of chronic disease: Hemoglobin baseline 9 -Transfused 3 units of PRBC this admission for hemoglobin around 7, patient reported history of dark stools few weeks ago and was noted to be Hemoccult positive, anemia panel suggestive of chronic disease primarily and mild iron deficiency, Plavix was held on admission  -Gastroenterology consulted, underwent endoscopy today which noted some erosive gastropathy and erythematous mucosa in the fundus, unfortunately patient is unable to complete his bowel prep and since he did not have active bleeding gastroenterology recommended colonoscopy in a few weeks as outpatient with Dr. Gala Romney his primary gastroenterologist in [place] to resume Plavix at discharge  Hyperlipidemia: Continue with statin  history of CVA: Continue with aspirin statin  -Resume Plavix at discharge  GERD: Continue PPI  Hepatitis C: needs to  follow-up as an outpatient  Diabetes type 2 with nephropathy: Continue with Lantus and sliding scale insulin. -Increased dose  Peripheral vascular disease: Status post left BKA 2017, right BKA 2018  Morbid obesity BMI 42 Need lifestyle modification  Chronic kidney disease a stage IIIa: -stable around 1.7- 1.8   Estimated body mass index is 41.5 kg/m as calculated from the following:   Height as of this encounter: 5\' 7"  (1.702 m).   Weight as of this encounter: 120.2 kg.   DVT prophylaxis: SCDs Code Status: DNR Family Communication: no family at bedside Disposition Plan:  Status is: Inpatient  Remains inpatient appropriate because:Ongoing active pain requiring inpatient pain management   Dispo: The patient is from: SNF              Anticipated d/c is to: SNF  Anticipated d/c date is: Tomorrow              Patient currently is not medically stable to d/c.   Difficult  to place patient No   Consultants:   Cardiology   Procedures:    CATH; Mid LM to Dist LM lesion is 70% stenosed.  Ost LAD to Prox LAD lesion is 70% stenosed.  Ramus lesion is 70% stenosed.  Ost Cx to Mid Cx lesion is 99% stenosed.  Prox RCA lesion is 40% stenosed.  There is moderate left ventricular systolic dysfunction.  LV end diastolic pressure is severely elevated.  The left ventricular ejection fraction is 35-45% by visual estimate.   1. Severe complex CAD. There is a tapering 70% stenosis in the distal Left main involving as well the ostium of the LAD and ramus intermediate with hazy appearance. The LCx is a co-dominant vessel with extensive dissection involving the ostium and proximal vessel and is is subtotally occluded. 2. Moderate LV dysfunction. EF 40-45% 3. Severely elevated LVEDP 30 mm Hg.  Endoscopy Dr. Fuller Plan Impression:               - Normal esophagus.                           - Erosive gastropathy with no bleeding and no                            stigmata of recent bleeding. Biopsied.                           - Erythematous mucosa in the gastric fundus.                            Biopsied.                           - Normal duodenal bulb and second portion of the                            duodenum. Recommendation:           - Return patient to hospital ward for ongoing care.                           - Unfortunately he was unable to complete the bowel                            prep. He was passing solid brown stool this morning                            so his colonoscopy was canceled.                           - Resume previous diet.                           - Continue present medications including  pantoprazole 40 mg po qd.                           - Await pathology results.                           - Consider colonoscopy in a few weeks after further                            recovery post NSTEMI.                            - Outpatient GI follow up with Dr. Gala Romney.                           - Resume Plavix (clopidogrel) at prior dose today.                            Refer to managing physician for further adjustment                            of therapy.  Antimicrobials:    Subjective: -Respiratory distress this morning, also had associated chest pain  Objective: Vitals:   04/01/20 0736 04/01/20 0903 04/01/20 1000 04/01/20 1100  BP: (!) 162/96 135/76 136/74 134/74  Pulse: (!) 128 95 90 88  Resp: (!) 26 19 16 19   Temp: 99.8 F (37.7 C) 99.6 F (37.6 C) 99.4 F (37.4 C) 99.7 F (37.6 C)  TempSrc: Oral Oral Oral Oral  SpO2: 96% 99% 100% 98%  Weight:      Height:        Intake/Output Summary (Last 24 hours) at 04/01/2020 1152 Last data filed at 04/01/2020 8657 Gross per 24 hour  Intake 606 ml  Output 2200 ml  Net -1594 ml   Filed Weights   03/31/20 0100 03/31/20 0752 04/01/20 0345  Weight: 120.1 kg 120 kg 120.2 kg    Examination:  General exam: Chronically ill pleasant male sitting up in bed, more somnolent today, easily arousable, oriented x2 CVS: S1-S2, regular rate rhythm Lungs: Scant basilar rales, otherwise clear Abdomen: Soft, nontender, bowel sounds present Extremities: Bilateral below-knee amputation  Skin: no rashes on exposed skin  Data Reviewed: I have personally reviewed following labs and imaging studies  CBC: Recent Labs  Lab 03/27/20 0220 03/28/20 1306 03/29/20 0330 03/30/20 0129 03/30/20 1643 03/31/20 0542 04/01/20 0430  WBC 6.8 6.7 6.5 6.0  --   --  7.8  HGB 8.0* 7.7* 7.3* 7.2* 8.4* 8.7* 8.2*  HCT 26.5* 24.6* 24.1* 23.8* 26.0* 26.7* 25.4*  MCV 94.6 92.8 95.3 94.4  --   --  93.4  PLT 261 284 263 275  --   --  846   Basic Metabolic Panel: Recent Labs  Lab 03/28/20 0218 03/29/20 0330 03/30/20 0129 03/31/20 0542 04/01/20 0430  NA 134* 135 136 135 133*  K 3.9 4.2 4.7 4.4 4.6  CL 92* 95* 96* 97* 96*  CO2 35* 32 32 31 31  GLUCOSE 211* 210* 154*  109* 201*  BUN 33* 34* 34* 30* 28*  CREATININE 1.83* 1.84* 1.90* 1.78* 1.69*  CALCIUM 8.7* 8.6* 8.6* 8.7* 8.9   GFR: Estimated Creatinine Clearance: 52.6 mL/min (A) (  by C-G formula based on SCr of 1.69 mg/dL (H)). Liver Function Tests: No results for input(s): AST, ALT, ALKPHOS, BILITOT, PROT, ALBUMIN in the last 168 hours. No results for input(s): LIPASE, AMYLASE in the last 168 hours. No results for input(s): AMMONIA in the last 168 hours. Coagulation Profile: No results for input(s): INR, PROTIME in the last 168 hours. Cardiac Enzymes: No results for input(s): CKTOTAL, CKMB, CKMBINDEX, TROPONINI in the last 168 hours. BNP (last 3 results) No results for input(s): PROBNP in the last 8760 hours. HbA1C: No results for input(s): HGBA1C in the last 72 hours. CBG: Recent Labs  Lab 03/31/20 2112 04/01/20 0343 04/01/20 0615 04/01/20 0725 04/01/20 1138  GLUCAP 244* 193* 201* 250* 216*   Lipid Profile: No results for input(s): CHOL, HDL, LDLCALC, TRIG, CHOLHDL, LDLDIRECT in the last 72 hours. Thyroid Function Tests: No results for input(s): TSH, T4TOTAL, FREET4, T3FREE, THYROIDAB in the last 72 hours. Anemia Panel: No results for input(s): VITAMINB12, FOLATE, FERRITIN, TIBC, IRON, RETICCTPCT in the last 72 hours. Sepsis Labs: No results for input(s): PROCALCITON, LATICACIDVEN in the last 168 hours.  No results found for this or any previous visit (from the past 240 hour(s)).   Scheduled Meds: . amLODipine  10 mg Oral Daily  . vitamin C  500 mg Oral BID  . aspirin EC  325 mg Oral Daily  . carvedilol  25 mg Oral BID WC  . DULoxetine  60 mg Oral Daily  . ferrous sulfate  325 mg Oral Q breakfast  . heparin injection (subcutaneous)  5,000 Units Subcutaneous Q8H  . hydrALAZINE  50 mg Oral Q8H  . insulin aspart  0-15 Units Subcutaneous TID WC  . insulin aspart  0-5 Units Subcutaneous QHS  . insulin aspart  8 Units Subcutaneous TID AC  . insulin glargine  30 Units Subcutaneous  QHS  . isosorbide mononitrate  120 mg Oral Daily  . lidocaine  1 patch Transdermal Daily  . pantoprazole  40 mg Oral Daily  . polyethylene glycol  17 g Oral Daily  . pregabalin  75 mg Oral BID  . rosuvastatin  20 mg Oral Daily  . senna  1 tablet Oral Daily  . sodium chloride flush  3 mL Intravenous Q12H  . sodium chloride flush  3 mL Intravenous Q12H  . tamsulosin  0.4 mg Oral Daily  . cyanocobalamin  500 mcg Oral Daily   Continuous Infusions: . sodium chloride       LOS: 12 days    Time spent: 25 minutes.   Domenic Polite, MD [facility]  04/01/2020, 11:52 AM

## 2020-04-02 ENCOUNTER — Encounter (HOSPITAL_COMMUNITY): Payer: Self-pay | Admitting: Gastroenterology

## 2020-04-02 LAB — GLUCOSE, CAPILLARY
Glucose-Capillary: 185 mg/dL — ABNORMAL HIGH (ref 70–99)
Glucose-Capillary: 241 mg/dL — ABNORMAL HIGH (ref 70–99)
Glucose-Capillary: 169 mg/dL — ABNORMAL HIGH (ref 70–99)
Glucose-Capillary: 174 mg/dL — ABNORMAL HIGH (ref 70–99)
Glucose-Capillary: 249 mg/dL — ABNORMAL HIGH (ref 70–99)

## 2020-04-02 LAB — CBC
HCT: 25.2 % — ABNORMAL LOW (ref 39.0–52.0)
Hemoglobin: 7.8 g/dL — ABNORMAL LOW (ref 13.0–17.0)
MCH: 29.4 pg (ref 26.0–34.0)
MCHC: 31 g/dL (ref 30.0–36.0)
MCV: 95.1 fL (ref 80.0–100.0)
Platelets: 260 10*3/uL (ref 150–400)
RBC: 2.65 MIL/uL — ABNORMAL LOW (ref 4.22–5.81)
RDW: 14.6 % (ref 11.5–15.5)
WBC: 6.6 10*3/uL (ref 4.0–10.5)
nRBC: 0 % (ref 0.0–0.2)

## 2020-04-02 LAB — BASIC METABOLIC PANEL
Anion gap: 7 (ref 5–15)
BUN: 32 mg/dL — ABNORMAL HIGH (ref 8–23)
CO2: 33 mmol/L — ABNORMAL HIGH (ref 22–32)
Calcium: 8.8 mg/dL — ABNORMAL LOW (ref 8.9–10.3)
Chloride: 94 mmol/L — ABNORMAL LOW (ref 98–111)
Creatinine, Ser: 1.7 mg/dL — ABNORMAL HIGH (ref 0.61–1.24)
GFR, Estimated: 44 mL/min — ABNORMAL LOW (ref >60)
Glucose, Bld: 233 mg/dL — ABNORMAL HIGH (ref 70–99)
Potassium: 4.3 mmol/L (ref 3.5–5.1)
Sodium: 134 mmol/L — ABNORMAL LOW (ref 135–145)

## 2020-04-02 MED ORDER — DARBEPOETIN ALFA 60 MCG/0.3ML IJ SOSY
60.0000 ug | PREFILLED_SYRINGE | Freq: Once | INTRAMUSCULAR | Status: AC
  Administered 2020-04-02: 60 ug via SUBCUTANEOUS
  Filled 2020-04-02: qty 0.3

## 2020-04-02 NOTE — Progress Notes (Addendum)
PROGRESS NOTE    Cory Weber  QVZ:[id number] DOB: 10-27-1950 DOA: 03/20/2020 PCP: Iona Beard, MD   Brief Narrative: 69/M resident of SNF, medical history significant for CKD, CVA, hepatitis C, bilateral BKA, hyperlipidemia, BPH, GERD, anemia, asthma who presented complaining of acute onset shortness of breath, he is on 2 L home O2 at baseline, was hypoxic with sats in the 70s upon arrival to the ED. -Also history of intermittent black stools few weeks ago -Admitted with acute hypoxic respiratory failure secondary to heart failure exacerbation, non-STEMI and anemia -Patient was transferred from [facility] to [facility] for cardiac catheterization. Cardiac cath performed 2/8 showed:  Severe complex CAD. ,  Medical management recommended, diuresed with IV Lasix and then transition to oral diuretics  -Also noted to have worsening of chronic anemia, transfused 3 units of PRBC this admission  Assessment & Plan:   NSTEMI: -Admitted with pulmonary edema, troponin significantly elevated at 12 K,  treated with heparin drip, nitrates  -cardiology consulted, patient underwent cardiac catheterization 12/8 which showed: Severe complex CAD. There is a tapering 70% stenosis in the distal Left main involving as well the ostium of the LAD and ramus intermediate with hazy appearance. The LCx is a co-dominant vessel with extensive dissection involving the ostium and proximal vessel and is is subtotally occluded. -Medical management recommended, not amenable to PCI, not a good candidate for CABG per cardiology -continue ASA, coreg, imdur  Acute systolic/diastolic heart failure exacerbation with acute hypoxic respiratory failure and pulmonary edema: Hypertensive urgency -Patient presented with oxygen saturation in the 70s, initially placed on nonrebreather, currently on 2 L. -Chest x-ray show pulmonary edema, BNP 213.  Echo ejection fraction 40 to 45%. -improved with diuretics, transitioned to  oral lasix 2/15.  -Continue hydralazine Norvasc and Coreg -Patient had an episode of chest pain and dyspnea yesterday morning, improved after additional Lasix -He is overall -14 L -BMP in a.m.  Anemia of chronic disease: Hemoglobin baseline 9 -Transfused 3 units of PRBC this admission for hemoglobin around 7, patient reported history of dark stools few weeks ago and was noted to be Hemoccult positive, anemia panel suggestive of chronic disease primarily and mild iron deficiency, Plavix was held on admission  -Gastroenterology consulted, underwent endoscopy 2/18  which noted some erosive gastropathy and erythematous mucosa in the fundus, unfortunately patient is unable to complete his bowel prep and since he did not have active bleeding gastroenterology recommended colonoscopy in a few weeks as outpatient with Dr. Gala Romney his primary gastroenterologist in [place] to resume Plavix at discharge -Hemoglobin 7.8 today, will give a dose of Aranesp  Hyperlipidemia: Continue with statin  history of CVA: Continue with aspirin statin  -Resume Plavix at discharge  GERD: Continue PPI  Hepatitis C: needs to  follow-up as an outpatient  Diabetes type 2 with nephropathy: Continue with Lantus and sliding scale insulin. -Increased dose  Peripheral vascular disease: Status post left BKA 2017, right BKA 2018  Morbid obesity BMI 42 Need lifestyle modification  Chronic kidney disease a stage IIIa: -stable around 1.7- 1.8   Estimated body mass index is 41.26 kg/m as calculated from the following:   Height as of this encounter: 5\' 7"  (1.702 m).   Weight as of this encounter: 119.5 kg.   DVT prophylaxis: Heparin subcutaneous Code Status: DNR Family Communication: no family at bedside Disposition Plan:  Status is: Inpatient  Remains inpatient appropriate because:Ongoing active pain requiring inpatient pain management   Dispo: The patient is  from: SNF              Anticipated d/c is  to: SNF              Anticipated d/c date is: 2/21              Patient currently is not medically stable to d/c.   Difficult to place patient No   Consultants:   Cardiology   Procedures:    CATH; Mid LM to Dist LM lesion is 70% stenosed.  Ost LAD to Prox LAD lesion is 70% stenosed.  Ramus lesion is 70% stenosed.  Ost Cx to Mid Cx lesion is 99% stenosed.  Prox RCA lesion is 40% stenosed.  There is moderate left ventricular systolic dysfunction.  LV end diastolic pressure is severely elevated.  The left ventricular ejection fraction is 35-45% by visual estimate.   1. Severe complex CAD. There is a tapering 70% stenosis in the distal Left main involving as well the ostium of the LAD and ramus intermediate with hazy appearance. The LCx is a co-dominant vessel with extensive dissection involving the ostium and proximal vessel and is is subtotally occluded. 2. Moderate LV dysfunction. EF 40-45% 3. Severely elevated LVEDP 30 mm Hg.  Endoscopy Dr. Fuller Plan Impression:               - Normal esophagus.                           - Erosive gastropathy with no bleeding and no                            stigmata of recent bleeding. Biopsied.                           - Erythematous mucosa in the gastric fundus.                            Biopsied.                           - Normal duodenal bulb and second portion of the                            duodenum. Recommendation:           - Return patient to hospital ward for ongoing care.                           - Unfortunately he was unable to complete the bowel                            prep. He was passing solid brown stool this morning                            so his colonoscopy was canceled.                           - Resume previous diet.                           -  Continue present medications including                            pantoprazole 40 mg po qd.                           - Await pathology results.                            - Consider colonoscopy in a few weeks after further                            recovery post NSTEMI.                           - Outpatient GI follow up with Dr. Gala Romney.                           - Resume Plavix (clopidogrel) at prior dose today.                            Refer to managing physician for further adjustment                            of therapy.  Antimicrobials:    Subjective: -Feels much better this morning, no events overnight  Objective: Vitals:   04/02/20 0332 04/02/20 0518 04/02/20 0726 04/02/20 1052  BP: (!) 123/50 124/73 (!) 148/79 (!) 160/79  Pulse:   99 (!) 49  Resp: 18  20 20   Temp: 98.3 F (36.8 C)  98.4 F (36.9 C) 99 F (37.2 C)  TempSrc: Oral  Oral Oral  SpO2: 96%  99% 100%  Weight: 119.5 kg     Height:        Intake/Output Summary (Last 24 hours) at 04/02/2020 1103 Last data filed at 04/02/2020 1051 Gross per 24 hour  Intake 120 ml  Output 1460 ml  Net -1340 ml   Filed Weights   03/31/20 0752 04/01/20 0345 04/02/20 0332  Weight: 120 kg 120.2 kg 119.5 kg    Examination:  General exam: Chronically ill pleasant male sitting up in bed, eating breakfast, oriented x3, no distress CVS: S1-S2, regular rate rhythm Lungs: Few basilar rales, otherwise clear Abdomen: Soft, nontender, bowel sounds present Extremities: Bilateral below-knee amputation Skin: no rashes on exposed skin  Data Reviewed: I have personally reviewed following labs and imaging studies  CBC: Recent Labs  Lab 03/28/20 1306 03/29/20 0330 03/30/20 0129 03/30/20 1643 03/31/20 0542 04/01/20 0430 04/02/20 0720  WBC 6.7 6.5 6.0  --   --  7.8 6.6  HGB 7.7* 7.3* 7.2* 8.4* 8.7* 8.2* 7.8*  HCT 24.6* 24.1* 23.8* 26.0* 26.7* 25.4* 25.2*  MCV 92.8 95.3 94.4  --   --  93.4 95.1  PLT 284 263 275  --   --  298 315   Basic Metabolic Panel: Recent Labs  Lab 03/29/20 0330 03/30/20 0129 03/31/20 0542 04/01/20 0430 04/02/20 0720  NA 135 136 135 133* 134*  K 4.2 4.7  4.4 4.6 4.3  CL 95* 96* 97* 96* 94*  CO2 32 32 31 31 33*  GLUCOSE 210* 154* 109* 201* 233*  BUN 34*  34* 30* 28* 32*  CREATININE 1.84* 1.90* 1.78* 1.69* 1.70*  CALCIUM 8.6* 8.6* 8.7* 8.9 8.8*   GFR: Estimated Creatinine Clearance: 52.2 mL/min (A) (by C-G formula based on SCr of 1.7 mg/dL (H)). Liver Function Tests: No results for input(s): AST, ALT, ALKPHOS, BILITOT, PROT, ALBUMIN in the last 168 hours. No results for input(s): LIPASE, AMYLASE in the last 168 hours. No results for input(s): AMMONIA in the last 168 hours. Coagulation Profile: No results for input(s): INR, PROTIME in the last 168 hours. Cardiac Enzymes: No results for input(s): CKTOTAL, CKMB, CKMBINDEX, TROPONINI in the last 168 hours. BNP (last 3 results) No results for input(s): PROBNP in the last 8760 hours. HbA1C: No results for input(s): HGBA1C in the last 72 hours. CBG: Recent Labs  Lab 04/01/20 1138 04/01/20 1706 04/01/20 2112 04/02/20 0331 04/02/20 0534  GLUCAP 216* 156* 168* 185* 241*   Lipid Profile: No results for input(s): CHOL, HDL, LDLCALC, TRIG, CHOLHDL, LDLDIRECT in the last 72 hours. Thyroid Function Tests: No results for input(s): TSH, T4TOTAL, FREET4, T3FREE, THYROIDAB in the last 72 hours. Anemia Panel: No results for input(s): VITAMINB12, FOLATE, FERRITIN, TIBC, IRON, RETICCTPCT in the last 72 hours. Sepsis Labs: No results for input(s): PROCALCITON, LATICACIDVEN in the last 168 hours.  No results found for this or any previous visit (from the past 240 hour(s)).   Scheduled Meds: . amLODipine  10 mg Oral Daily  . vitamin C  500 mg Oral BID  . aspirin EC  325 mg Oral Daily  . carvedilol  25 mg Oral BID WC  . DULoxetine  60 mg Oral Daily  . ferrous sulfate  325 mg Oral Q breakfast  . furosemide  80 mg Oral Daily  . heparin injection (subcutaneous)  5,000 Units Subcutaneous Q8H  . hydrALAZINE  50 mg Oral Q8H  . insulin aspart  0-15 Units Subcutaneous TID WC  . insulin aspart  0-5  Units Subcutaneous QHS  . insulin aspart  8 Units Subcutaneous TID AC  . insulin glargine  35 Units Subcutaneous QHS  . isosorbide mononitrate  120 mg Oral Daily  . lidocaine  1 patch Transdermal Daily  . pantoprazole  40 mg Oral Daily  . polyethylene glycol  17 g Oral Daily  . pregabalin  75 mg Oral Daily  . rosuvastatin  20 mg Oral Daily  . senna  1 tablet Oral Daily  . sodium chloride flush  3 mL Intravenous Q12H  . sodium chloride flush  3 mL Intravenous Q12H  . tamsulosin  0.4 mg Oral Daily  . cyanocobalamin  500 mcg Oral Daily   Continuous Infusions: . sodium chloride       LOS: 13 days    Time spent: 25 minutes.   Domenic Polite, MD [facility]  04/02/2020, 11:03 AM

## 2020-04-02 NOTE — Plan of Care (Signed)
  Problem: Pain Managment: Goal: General experience of comfort will improve Outcome: Completed/Met   Problem: Skin Integrity: Goal: Risk for impaired skin integrity will decrease Outcome: Completed/Met

## 2020-04-02 NOTE — Progress Notes (Signed)
Aranesp CONSULT NOTE - Initial Consult  Pharmacy Consult for Aranesp Indication: Anemia of CKD  Allergies  Allergen Reactions  . No Known Allergies     Patient Measurements: Height: 5\' 7"  (170.2 cm) Weight: 119.5 kg (263 lb 7.2 oz) IBW/kg (Calculated) : 66.1  Vital Signs: Temp: 99 F (37.2 C) (02/20 1052) Temp Source: Oral (02/20 1052) BP: 160/79 (02/20 1052) Pulse Rate: 49 (02/20 1052)  Labs: Recent Labs    03/31/20 0542 04/01/20 0430 04/02/20 0720  HGB 8.7* 8.2* 7.8*  HCT 26.7* 25.4* 25.2*  PLT  --  298 260  CREATININE 1.78* 1.69* 1.70*    Estimated Creatinine Clearance: 52.2 mL/min (A) (by C-G formula based on SCr of 1.7 mg/dL (H)).   Medical History: Past Medical History:  Diagnosis Date  . Anemia   . Asthma   . BPH (benign prostatic hyperplasia)   . Cellulitis and abscess of foot 07/2015  . Chronic diastolic heart failure ([place])    per Nursing facility  . Chronic kidney disease    CKD stage 3  . CVA (cerebral vascular accident) ([place]) 2015   denies residual on 08/15/2015  . GERD (gastroesophageal reflux disease)   . Hepatitis C    states he was diagnosed in 2007 or 2007 while living in [place], [place]  . Hepatitis C   . Hyperlipidemia   . Hypertension   . Ischemia of foot 05/31/2016  . Osteomyelitis ([place])    per Nursing Facility  . Peripheral neuropathy   . Pneumonia X 1  . PVD (peripheral vascular disease) ([place])   . Type II diabetes mellitus ([place])   . 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 amLODipine  10 mg Oral Daily  . vitamin C  500 mg Oral BID  . aspirin EC  325 mg Oral Daily  . carvedilol  25 mg Oral BID WC  . DULoxetine  60 mg Oral Daily  . ferrous sulfate  325 mg Oral Q breakfast  . furosemide  80 mg Oral Daily  . heparin injection (subcutaneous)  5,000 Units Subcutaneous Q8H  . hydrALAZINE  50 mg Oral Q8H  . insulin aspart  0-15 Units Subcutaneous TID WC  . insulin aspart  0-5 Units Subcutaneous QHS  . insulin  aspart  8 Units Subcutaneous TID AC  . insulin glargine  35 Units Subcutaneous QHS  . isosorbide mononitrate  120 mg Oral Daily  . lidocaine  1 patch Transdermal Daily  . pantoprazole  40 mg Oral Daily  . polyethylene glycol  17 g Oral Daily  . pregabalin  75 mg Oral Daily  . rosuvastatin  20 mg Oral Daily  . senna  1 tablet Oral Daily  . sodium chloride flush  3 mL Intravenous Q12H  . sodium chloride flush  3 mL Intravenous Q12H  . tamsulosin  0.4 mg Oral Daily  . cyanocobalamin  500 mcg Oral Daily    Assessment: Patient admitted with CHF exacerbation. Also with worsening anemia, s/p 3 units PRBC this admit. Hct today is 7.8 (baseline 9), recent history of GI bleeds. Chronic kidney disease stage IIIa, not on dialysis.  * Outpatient ESA/iron orders: N/A * Last Tsat 19 and ferritin 19 * Last doses of Aranesp given on N/A  Goal of Therapy:   Prevent need for transfusions    Plan:  Aranesp 60 mcg SQ once for anemia of CKD ferrous sulfate 325mg /day ordered for replacement.  * Hgb 7.8 on 2/20, next dose due  today  Norina Buzzard, PharmD PGY1 Pharmacy Resident 04/02/2020 11:26 AM

## 2020-04-03 DIAGNOSIS — I214 Non-ST elevation (NSTEMI) myocardial infarction: Secondary | ICD-10-CM | POA: Diagnosis not present

## 2020-04-03 LAB — CBC
HCT: 22.9 % — ABNORMAL LOW (ref 39.0–52.0)
HCT: 23.7 % — ABNORMAL LOW (ref 39.0–52.0)
Hemoglobin: 7.5 g/dL — ABNORMAL LOW (ref 13.0–17.0)
Hemoglobin: 7.5 g/dL — ABNORMAL LOW (ref 13.0–17.0)
MCH: 30.1 pg (ref 26.0–34.0)
MCH: 29.6 pg (ref 26.0–34.0)
MCHC: 32.8 g/dL (ref 30.0–36.0)
MCHC: 31.6 g/dL (ref 30.0–36.0)
MCV: 92 fL (ref 80.0–100.0)
MCV: 93.7 fL (ref 80.0–100.0)
Platelets: 262 10*3/uL (ref 150–400)
Platelets: 277 10*3/uL (ref 150–400)
RBC: 2.49 MIL/uL — ABNORMAL LOW (ref 4.22–5.81)
RBC: 2.53 MIL/uL — ABNORMAL LOW (ref 4.22–5.81)
RDW: 14.4 % (ref 11.5–15.5)
RDW: 14.6 % (ref 11.5–15.5)
WBC: 6.5 10*3/uL (ref 4.0–10.5)
WBC: 6.8 10*3/uL (ref 4.0–10.5)
nRBC: 0 % (ref 0.0–0.2)
nRBC: 0 % (ref 0.0–0.2)

## 2020-04-03 LAB — GLUCOSE, CAPILLARY
Glucose-Capillary: 91 mg/dL (ref 70–99)
Glucose-Capillary: 171 mg/dL — ABNORMAL HIGH (ref 70–99)
Glucose-Capillary: 114 mg/dL — ABNORMAL HIGH (ref 70–99)
Glucose-Capillary: 148 mg/dL — ABNORMAL HIGH (ref 70–99)
Glucose-Capillary: 134 mg/dL — ABNORMAL HIGH (ref 70–99)

## 2020-04-03 LAB — SURGICAL PATHOLOGY

## 2020-04-03 LAB — BASIC METABOLIC PANEL
Anion gap: 5 (ref 5–15)
BUN: 33 mg/dL — ABNORMAL HIGH (ref 8–23)
CO2: 33 mmol/L — ABNORMAL HIGH (ref 22–32)
Calcium: 8.7 mg/dL — ABNORMAL LOW (ref 8.9–10.3)
Chloride: 95 mmol/L — ABNORMAL LOW (ref 98–111)
Creatinine, Ser: 1.86 mg/dL — ABNORMAL HIGH (ref 0.61–1.24)
GFR, Estimated: 39 mL/min — ABNORMAL LOW (ref >60)
Glucose, Bld: 112 mg/dL — ABNORMAL HIGH (ref 70–99)
Potassium: 4.3 mmol/L (ref 3.5–5.1)
Sodium: 133 mmol/L — ABNORMAL LOW (ref 135–145)

## 2020-04-03 MED ORDER — PEG-KCL-NACL-NASULF-NA ASC-C 100 G PO SOLR
0.5000 | Freq: Once | ORAL | Status: AC
  Administered 2020-04-03: 100 g via ORAL
  Filled 2020-04-03: qty 1

## 2020-04-03 MED ORDER — SODIUM CHLORIDE 0.9 % IV SOLN
510.0000 mg | Freq: Once | INTRAVENOUS | Status: AC
  Administered 2020-04-03: 510 mg via INTRAVENOUS
  Filled 2020-04-03: qty 17

## 2020-04-03 MED ORDER — PEG-KCL-NACL-NASULF-NA ASC-C 100 G PO SOLR: 1.0000 | Freq: Once | ORAL | Status: DC

## 2020-04-03 MED ORDER — BISACODYL 5 MG PO TBEC
10.0000 mg | DELAYED_RELEASE_TABLET | Freq: Two times a day (BID) | ORAL | Status: AC
  Administered 2020-04-03 – 2020-04-04 (×4): 10 mg via ORAL
  Filled 2020-04-03 (×4): qty 2

## 2020-04-03 NOTE — Progress Notes (Signed)
Physical Therapy Treatment Patient Details Name: Cory Weber MRN: [id number] DOB: 05-10-1950 Today's Date: 04/03/2020    History of Present Illness Pt adm with acute hypoxic respiratory failure secondary to heart failure exacerbation and NSTEMI. Cardiac cath 2/8 severe complex CAD. Pt not candidate for CABG or PCI. PMH - bil BKA 2017/2018, ckd, cva, hep C, asthma with 2L O2 [place] baseline. 1 unit PRBC on 2/17. Plan for Colonoscopy 2/18.    PT Comments    Pt received in supine, pleasantly agreeable to therapy session and with good participation and tolerance for mobility. Pt performed posterior seated scoot from bed> chair with +70minA and transfer pad assist, VSS on 2L O2 [place] during mobility and pt agreeable to remain up in chair at least 2 hours at end of session, chair pad alarm activated for safety. Pt continues to benefit from PT services to progress toward functional mobility goals. Continue to recommend return to SNF.  Follow Up Recommendations  Other (comment) (return to long term care with PT)     Equipment Recommendations  None recommended by PT    Recommendations for Other Services       Precautions / Restrictions Precautions Precautions: Fall Restrictions Weight Bearing Restrictions: No Other Position/Activity Restrictions: bil BKA    Mobility  Bed Mobility Overal bed mobility: Needs Assistance Bed Mobility: Rolling Rolling: Min assist   Supine to sit: Min assist     General bed mobility comments: to roll L/R for repositioning; minA for supine to long sit with R rail and LUE support    Transfers Overall transfer level: Needs assistance Equipment used:  (transfer pad assist) Transfers: Comptroller transfers: From elevated surface;Min assist;+2 safety/equipment   General transfer comment: from bed>chair via posterior scoot, transfer pad assist minA and pt with good carryover of instruction to scoot hips backward,  needs cues at times for hand placement; increased time to perform and 2L [place] on during mobility  Ambulation/Gait                 Stairs             Wheelchair Mobility    Modified Rankin (Stroke Patients Only)       Balance Overall balance assessment: Needs assistance Sitting-balance support: No upper extremity supported;Feet unsupported Sitting balance-Leahy Scale: Fair Sitting balance - Comments: in long sit, needs supervision to min guard for static sitting, +81minA for posterior scooting       Standing balance comment: pt does not have B prosthetics in hospital                            Cognition Arousal/Alertness: Awake/alert (drowsy initially) Behavior During Therapy: WFL for tasks assessed/performed Overall Cognitive Status: Within Functional Limits for tasks assessed                                 General Comments: good following of 1-step commands, fair following of 2-step commands.      Exercises Other Exercises Other Exercises: Pt has HEP handout in room, encouraged glute sets, hip extension, hip abduction, hip flexion, quad sets 3x10 reps daily.    General Comments        Pertinent Vitals/Pain Pain Assessment: Faces Faces Pain Scale: Hurts a little bit Pain Location: RLE, improved now with lidocaine patch on R residual limb Pain Descriptors /  Indicators: Discomfort Pain Intervention(s): Monitored during session;Repositioned;Premedicated before session    Home Living                      Prior Function            PT Goals (current goals can now be found in the care plan section) Acute Rehab PT Goals Patient Stated Goal: to get to [facility] next session PT Goal Formulation: With patient Time For Goal Achievement: 04/06/20 Potential to Achieve Goals: Good Progress towards PT goals: Progressing toward goals    Frequency    Min 2X/week      PT Plan Current plan remains appropriate     Co-evaluation              AM-PAC PT "6 Clicks" Mobility   Outcome Measure  Help needed turning from your back to your side while in a flat bed without using bedrails?: A Little Help needed moving from lying on your back to sitting on the side of a flat bed without using bedrails?: A Little Help needed moving to and from a bed to a chair (including a wheelchair)?: A Little Help needed standing up from a chair using your arms (e.g., wheelchair or bedside chair)?: Total Help needed to walk in hospital room?: Total Help needed climbing 3-5 steps with a railing? : Total 6 Click Score: 12    End of Session Equipment Utilized During Treatment: Oxygen Activity Tolerance: Patient tolerated treatment well Patient left: with call bell/phone within reach;in chair;with chair alarm set;Other (comment) (briefs donned) Nurse Communication: Mobility status;Other (comment) (RN notified pt will need anterior scoot to get back to bed and +2 for safety) PT Visit Diagnosis: Other abnormalities of gait and mobility (R26.89);Muscle weakness (generalized) (M62.81)     Time: 2956-2130 PT Time Calculation (min) (ACUTE ONLY): 23 min  Charges:  $Therapeutic Activity: 23-37 mins                     Yadriel Kerrigan P., PTA Acute Rehabilitation Services Pager: [phone] Office: [place] 04/03/2020, 2:08 PM

## 2020-04-03 NOTE — Plan of Care (Signed)
  Problem: Safety: Goal: Ability to remain free from injury will improve Outcome: Completed/Met

## 2020-04-03 NOTE — Progress Notes (Signed)
PROGRESS NOTE    Cory Weber  JXB:[id number] DOB: 1950-03-22 DOA: 03/20/2020 PCP: Iona Beard, MD   Brief Narrative: 69/M resident of SNF, medical history significant for CKD, CVA, hepatitis C, bilateral BKA, hyperlipidemia, BPH, GERD, anemia, asthma who presented complaining of acute onset shortness of breath, he is on 2 L home O2 at baseline, was hypoxic with sats in the 70s upon arrival to the ED. -Also history of intermittent black stools few weeks ago -Admitted with acute hypoxic respiratory failure secondary to heart failure exacerbation, non-STEMI and anemia -Patient was transferred from [facility] to [facility] for cardiac catheterization. Cardiac cath performed 2/8 showed:  Severe complex CAD. ,  Medical management recommended, diuresed with IV Lasix and then transition to oral diuretics  -Also noted to have worsening of chronic anemia, transfused 3 units of PRBC this admission  Assessment & Plan:   NSTEMI: -Admitted with pulmonary edema, troponin significantly elevated at 12 K,  treated with heparin drip, nitrates  -cardiology consulted, patient underwent cardiac catheterization 12/8 which showed: Severe complex CAD. There is a tapering 70% stenosis in the distal Left main involving as well the ostium of the LAD and ramus intermediate with hazy appearance. The LCx is a co-dominant vessel with extensive dissection involving the ostium and proximal vessel and is is subtotally occluded. -Medical management recommended, not amenable to PCI, not a good candidate for CABG per cardiology -continue ASA, coreg, imdur  Acute systolic/diastolic heart failure exacerbation with acute hypoxic respiratory failure and pulmonary edema: Hypertensive urgency -Patient presented with oxygen saturation in the 70s, initially placed on nonrebreather, currently on 2 L. -Chest x-ray show pulmonary edema, BNP 213.  Echo ejection fraction 40 to 45%. -improved with diuretics, transitioned to  oral lasix 2/15.  -Continue hydralazine Norvasc and Coreg -Patient had another episode of chest pain, dyspnea over the weekend resolved after additional Lasix -Stable now, continue Lasix 80 mg daily, unfortunately hemoglobin continues to drift down  Anemia of chronic disease and iron deficiency: Hemoglobin baseline 9 -Transfused 3 units of PRBC this admission for hemoglobin around 7, patient reported history of dark stools few weeks ago and was noted to be Hemoccult positive, anemia panel suggestive of chronic disease  and mild iron deficiency, Plavix was held on admission  -Gastroenterology consulted, underwent endoscopy 2/18  which noted some erosive gastropathy and erythematous mucosa in the fundus, unfortunately patient is hadnt completed his bowel prep, so gastroenterology recommended colonoscopy in a few weeks as outpatient with Dr. Gala Romney, okay to resume Plavix,  -Hemoglobin down to 7.5 today, given a dose of Aranesp yesterday, will give IV Fe too, may need another unit of PRBC soon, recheck CBC this afternoon, goal hemoglobin is around 8 with extensive CAD -Patient reports it would be very difficult for him to have an outpatient colonoscopy arranged by his SNF with his bilateral amputee status, will d/w GI again  Hyperlipidemia: Continue with statin  history of CVA: Continue with aspirin statin  -Resume Plavix at discharge if Hb stabilizes  GERD: Continue PPI  Hepatitis C: needs to  follow-up as an outpatient  Diabetes type 2 with nephropathy: -Continue with Lantus and sliding scale insulin.  Peripheral vascular disease: -Status post left BKA 2017, right BKA 2018  Morbid obesity BMI 42 -Need lifestyle modification  Chronic kidney disease a stage IIIa: -stable around 1.7- 1.8   Estimated body mass index is 41.4 kg/m as calculated from the following:   Height as of this encounter: 5' 7"  (1.702 m).  Weight as of this encounter: 119.9 kg.   DVT prophylaxis: hold heparin  subcutaneous Code Status: DNR Family Communication: no family at bedside Disposition Plan:  Status is: Inpatient  Remains inpatient appropriate because:Ongoing active pain requiring inpatient pain management   Dispo: The patient is from: SNF              Anticipated d/c is to: SNF              Anticipated d/c date is: 2/21              Patient currently is not medically stable to d/c.   Difficult to place patient No   Consultants:   Cardiology   Procedures:    CATH; Mid LM to Dist LM lesion is 70% stenosed.  Ost LAD to Prox LAD lesion is 70% stenosed.  Ramus lesion is 70% stenosed.  Ost Cx to Mid Cx lesion is 99% stenosed.  Prox RCA lesion is 40% stenosed.  There is moderate left ventricular systolic dysfunction.  LV end diastolic pressure is severely elevated.  The left ventricular ejection fraction is 35-45% by visual estimate.   1. Severe complex CAD. There is a tapering 70% stenosis in the distal Left main involving as well the ostium of the LAD and ramus intermediate with hazy appearance. The LCx is a co-dominant vessel with extensive dissection involving the ostium and proximal vessel and is is subtotally occluded. 2. Moderate LV dysfunction. EF 40-45% 3. Severely elevated LVEDP 30 mm Hg.  Endoscopy Dr. Fuller Plan Impression:               - Normal esophagus.                           - Erosive gastropathy with no bleeding and no                            stigmata of recent bleeding. Biopsied.                           - Erythematous mucosa in the gastric fundus.                            Biopsied.                           - Normal duodenal bulb and second portion of the                            duodenum. Recommendation:           - Return patient to hospital ward for ongoing care.                           - Unfortunately he was unable to complete the bowel                            prep. He was passing solid brown stool this morning                             so his colonoscopy was canceled.                           -  Resume previous diet.                           - Continue present medications including                            pantoprazole 40 mg po qd.                           - Await pathology results.                           - Consider colonoscopy in a few weeks after further                            recovery post NSTEMI.                           - Outpatient GI follow up with Dr. Gala Romney.                           - Resume Plavix (clopidogrel) at prior dose.                       Antimicrobials:    Subjective: -Had some mild discomfort in his chest earlier this morning, resolved after Tylenol  Objective: Vitals:   04/03/20 0500 04/03/20 0545 04/03/20 0807 04/03/20 1127  BP:  (!) 151/86 (!) 146/71 128/89  Pulse:   93   Resp:      Temp:   97.7 F (36.5 C) (!) 97.4 F (36.3 C)  TempSrc:  Oral Oral Oral  SpO2:  99% 100%   Weight: 119.9 kg     Height:        Intake/Output Summary (Last 24 hours) at 04/03/2020 1149 Last data filed at 04/03/2020 1051 Gross per 24 hour  Intake 1280 ml  Output 2400 ml  Net -1120 ml   Filed Weights   04/02/20 0332 04/03/20 0026 04/03/20 0500  Weight: 119.5 kg 119.9 kg 119.9 kg    Examination:  General exam: Chronically ill pleasant male sitting up in bed, awake alert oriented x3, no distress CVS: S1-S2, regular rate rhythm Lungs: Few basilar rales, otherwise clear Abdomen: Soft, nontender, bowel sounds present Extremities: Bilateral below-knee amputation Skin: no rashes on exposed skin  Data Reviewed: I have personally reviewed following labs and imaging studies  CBC: Recent Labs  Lab 03/29/20 0330 03/30/20 0129 03/30/20 1643 03/31/20 0542 04/01/20 0430 04/02/20 0720 04/03/20 0239  WBC 6.5 6.0  --   --  7.8 6.6 6.5  HGB 7.3* 7.2* 8.4* 8.7* 8.2* 7.8* 7.5*  HCT 24.1* 23.8* 26.0* 26.7* 25.4* 25.2* 22.9*  MCV 95.3 94.4  --   --  93.4 95.1 92.0  PLT 263 275  --   --   298 260 161   Basic Metabolic Panel: Recent Labs  Lab 03/30/20 0129 03/31/20 0542 04/01/20 0430 04/02/20 0720 04/03/20 0239  NA 136 135 133* 134* 133*  K 4.7 4.4 4.6 4.3 4.3  CL 96* 97* 96* 94* 95*  CO2 32 31 31 33* 33*  GLUCOSE 154* 109* 201* 233* 112*  BUN 34* 30* 28* 32* 33*  CREATININE 1.90* 1.78* 1.69* 1.70* 1.86*  CALCIUM  8.6* 8.7* 8.9 8.8* 8.7*   GFR: Estimated Creatinine Clearance: 47.8 mL/min (A) (by C-G formula based on SCr of 1.86 mg/dL (H)). Liver Function Tests: No results for input(s): AST, ALT, ALKPHOS, BILITOT, PROT, ALBUMIN in the last 168 hours. No results for input(s): LIPASE, AMYLASE in the last 168 hours. No results for input(s): AMMONIA in the last 168 hours. Coagulation Profile: No results for input(s): INR, PROTIME in the last 168 hours. Cardiac Enzymes: No results for input(s): CKTOTAL, CKMB, CKMBINDEX, TROPONINI in the last 168 hours. BNP (last 3 results) No results for input(s): PROBNP in the last 8760 hours. HbA1C: No results for input(s): HGBA1C in the last 72 hours. CBG: Recent Labs  Lab 04/02/20 1230 04/02/20 1612 04/02/20 2115 04/03/20 0557 04/03/20 0812  GLUCAP 169* 174* 249* 91 171*   Lipid Profile: No results for input(s): CHOL, HDL, LDLCALC, TRIG, CHOLHDL, LDLDIRECT in the last 72 hours. Thyroid Function Tests: No results for input(s): TSH, T4TOTAL, FREET4, T3FREE, THYROIDAB in the last 72 hours. Anemia Panel: No results for input(s): VITAMINB12, FOLATE, FERRITIN, TIBC, IRON, RETICCTPCT in the last 72 hours. Sepsis Labs: No results for input(s): PROCALCITON, LATICACIDVEN in the last 168 hours.  No results found for this or any previous visit (from the past 240 hour(s)).   Scheduled Meds: . amLODipine  10 mg Oral Daily  . vitamin C  500 mg Oral BID  . aspirin EC  325 mg Oral Daily  . bisacodyl  10 mg Oral BID  . carvedilol  25 mg Oral BID WC  . DULoxetine  60 mg Oral Daily  . furosemide  80 mg Oral Daily  . heparin  injection (subcutaneous)  5,000 Units Subcutaneous Q8H  . hydrALAZINE  50 mg Oral Q8H  . insulin aspart  0-15 Units Subcutaneous TID WC  . insulin aspart  0-5 Units Subcutaneous QHS  . insulin aspart  8 Units Subcutaneous TID AC  . insulin glargine  35 Units Subcutaneous QHS  . isosorbide mononitrate  120 mg Oral Daily  . lidocaine  1 patch Transdermal Daily  . pantoprazole  40 mg Oral Daily  . peg 3350 powder  0.5 kit Oral Once   And  . peg 3350 powder  0.5 kit Oral Once  . pregabalin  75 mg Oral Daily  . rosuvastatin  20 mg Oral Daily  . senna  1 tablet Oral Daily  . sodium chloride flush  3 mL Intravenous Q12H  . sodium chloride flush  3 mL Intravenous Q12H  . tamsulosin  0.4 mg Oral Daily  . cyanocobalamin  500 mcg Oral Daily   Continuous Infusions: . sodium chloride       LOS: 14 days    Time spent: 25 minutes.   Domenic Polite, MD [facility]  04/03/2020, 11:49 AM

## 2020-04-03 NOTE — Progress Notes (Signed)
Daily Rounding Note  04/03/2020, 11:09 AM  LOS: 14 days   SUBJECTIVE:   Chief complaint:   Anemia.  Please see the initial consult note of 03/29/2020 when he was evaluated by Dr. Fuller Plan for FOBT positive anemia. Hx HCV and gastric ulcer.  Hepatic steatosis on 10/2019 ultrasound. Plavix was held and Dr. Fuller Plan proceeded with 03/31/2020 EGD: Erosive gastropathy but no stigmata of bleeding, biopsied.  Gastric erythema, biopsied. Plan colonoscopy not performed as pt was not adequately prepped, despite a 2-day prep.  Unfortunately the solid food had been continued through the morning of the day before the procedures.  Dr. Fuller Plan mentioned possible colonoscopy in a few weeks.  Patient continues to drop his blood counts and has required a total of 3 PRBCs, 2 on 2/8, one on 2/17.  Patient has not had any bowel movements for 3 or 4 days.  His norm at the SNF is to have a daily bowel movement though he uses laxatives regularly.  No nausea, no vomiting.  Good appetite.  No abdominal pain.  Overall breathing is better.  No chest pain Hg drifting from 8.7 >> 7.5 over the last 4 days.   OBJECTIVE:         Vital signs in last 24 hours:    Temp:  [97.7 F (36.5 C)-98.8 F (37.1 C)] 97.7 F (36.5 C) (02/21 0807) Pulse Rate:  [85-93] 93 (02/21 0807) Resp:  [18-20] 18 (02/21 0400) BP: (120-185)/(64-93) 146/71 (02/21 0807) SpO2:  [97 %-100 %] 100 % (02/21 0807) Weight:  [119.9 kg] 119.9 kg (02/21 0500) Last BM Date: 03/30/20 Filed Weights   04/02/20 0332 04/03/20 0026 04/03/20 0500  Weight: 119.5 kg 119.9 kg 119.9 kg   General: Pleasant, comfortable, does not look acutely ill.  Obese. Heart: RRR. Chest: Diminished breath sounds throughout but no labored breathing.  No cough Abdomen: Obese.  Not tender, not distended.  Active bowel sounds. Extremities: Status post bilateral AKAs. Neuro/Psych: Congenial, alert, fluid speech.  Moves all 4 limbs.   Asks appropriate questions.  Intake/Output from previous day: 02/20 0701 - 02/21 0700 In: 920 [P.O.:920] Out: 2760 [Urine:2760]  Intake/Output this shift: Total I/O In: 360 [P.O.:360] Out: 250 [Urine:250]  Lab Results: Recent Labs    04/01/20 0430 04/02/20 0720 04/03/20 0239  WBC 7.8 6.6 6.5  HGB 8.2* 7.8* 7.5*  HCT 25.4* 25.2* 22.9*  PLT 298 260 262   BMET Recent Labs    04/01/20 0430 04/02/20 0720 04/03/20 0239  NA 133* 134* 133*  K 4.6 4.3 4.3  CL 96* 94* 95*  CO2 31 33* 33*  GLUCOSE 201* 233* 112*  BUN 28* 32* 33*  CREATININE 1.69* 1.70* 1.86*  CALCIUM 8.9 8.8* 8.7*   LFT No results for input(s): PROT, ALBUMIN, AST, ALT, ALKPHOS, BILITOT, BILIDIR, IBILI in the last 72 hours. PT/INR No results for input(s): LABPROT, INR in the last 72 hours. Hepatitis Panel No results for input(s): HEPBSAG, HCVAB, HEPAIGM, HEPBIGM in the last 72 hours.  Studies/Results: No results found.  ASSESMENT:   *    FOBT positive anemia.   03/31/2020 EGD showed erosive gastropathy, gastric erythema. Pathology collected is still pending. On Protonix 40 mg p.o./day. Planned colonoscopy canceled as patient was passing solid stool despite aggressive 2-day prep. Persistent constipation as inpt.   Colonoscopy 08/2012 with 4 mm tubular adenoma, tortuous colon.  *    Anemia of chronic disease.  Baseline Hb 9.  Has received 3  PRBCs this admission.  feraheme on 2/21.  Oral iron in place.  Aranesp q 2 weeks as outpt.    *   Chronic Plavix.  Has been on hold for many days.  *    NSTEMI. Acute systolic/diastolic heart failure.  LVEF 40 to 45%. resp status improved.    *   History CVA.  *   CKD stage 3a.    *  IDDM  PLAN   *    Repeat Colonoscopy on 2/23. Repeat 2-day prep.  Movie prep, Dulcolax.  First prep tonight by mouth.  Tomorrow's prep may require placement of NG tube. Clears start now. Stopped the oral iron as this will interfere with bowel prep. Await pathology report  from specimens collected on EGD 2/18    Azucena Freed  04/03/2020, 11:09 AM Phone [phone]

## 2020-04-03 NOTE — Progress Notes (Signed)
Ok to get HCV RNA to confirm cure per Dr. Broadus John.  Onnie Boer, PharmD, BCIDP, AAHIVP, CPP Infectious Disease Pharmacist 04/03/2020 8:46 AM

## 2020-04-04 DIAGNOSIS — I214 Non-ST elevation (NSTEMI) myocardial infarction: Secondary | ICD-10-CM | POA: Diagnosis not present

## 2020-04-04 DIAGNOSIS — K921 Melena: Secondary | ICD-10-CM | POA: Diagnosis not present

## 2020-04-04 DIAGNOSIS — D638 Anemia in other chronic diseases classified elsewhere: Secondary | ICD-10-CM | POA: Diagnosis not present

## 2020-04-04 DIAGNOSIS — R195 Other fecal abnormalities: Secondary | ICD-10-CM | POA: Diagnosis not present

## 2020-04-04 LAB — BASIC METABOLIC PANEL
Anion gap: 7 (ref 5–15)
BUN: 24 mg/dL — ABNORMAL HIGH (ref 8–23)
CO2: 31 mmol/L (ref 22–32)
Calcium: 8.9 mg/dL (ref 8.9–10.3)
Chloride: 96 mmol/L — ABNORMAL LOW (ref 98–111)
Creatinine, Ser: 1.54 mg/dL — ABNORMAL HIGH (ref 0.61–1.24)
GFR, Estimated: 49 mL/min — ABNORMAL LOW (ref >60)
Glucose, Bld: 158 mg/dL — ABNORMAL HIGH (ref 70–99)
Potassium: 4.3 mmol/L (ref 3.5–5.1)
Sodium: 134 mmol/L — ABNORMAL LOW (ref 135–145)

## 2020-04-04 LAB — GLUCOSE, CAPILLARY
Glucose-Capillary: 68 mg/dL — ABNORMAL LOW (ref 70–99)
Glucose-Capillary: 91 mg/dL (ref 70–99)
Glucose-Capillary: 101 mg/dL — ABNORMAL HIGH (ref 70–99)
Glucose-Capillary: 87 mg/dL (ref 70–99)
Glucose-Capillary: 173 mg/dL — ABNORMAL HIGH (ref 70–99)
Glucose-Capillary: 137 mg/dL — ABNORMAL HIGH (ref 70–99)
Glucose-Capillary: 176 mg/dL — ABNORMAL HIGH (ref 70–99)
Glucose-Capillary: 94 mg/dL (ref 70–99)

## 2020-04-04 LAB — CBC
HCT: 24.3 % — ABNORMAL LOW (ref 39.0–52.0)
Hemoglobin: 7.6 g/dL — ABNORMAL LOW (ref 13.0–17.0)
MCH: 29.5 pg (ref 26.0–34.0)
MCHC: 31.3 g/dL (ref 30.0–36.0)
MCV: 94.2 fL (ref 80.0–100.0)
Platelets: 280 10*3/uL (ref 150–400)
RBC: 2.58 MIL/uL — ABNORMAL LOW (ref 4.22–5.81)
RDW: 14.5 % (ref 11.5–15.5)
WBC: 6.2 10*3/uL (ref 4.0–10.5)
nRBC: 0 % (ref 0.0–0.2)

## 2020-04-04 MED ORDER — PEG-KCL-NACL-NASULF-NA ASC-C 100 G PO SOLR
0.5000 | Freq: Once | ORAL | Status: AC
  Administered 2020-04-04: 100 g via ORAL
  Filled 2020-04-04: qty 1

## 2020-04-04 MED ORDER — INSULIN ASPART 100 UNIT/ML ~~LOC~~ SOLN
4.0000 [IU] | Freq: Three times a day (TID) | SUBCUTANEOUS | Status: DC
  Administered 2020-04-04 – 2020-04-08 (×9): 4 [IU] via SUBCUTANEOUS

## 2020-04-04 NOTE — H&P (View-Only) (Signed)
[facility] ROUNDING NOTE   Subjective: No acute events overnight.  Tolerated first portion of bowel prep without issue.  Liquid diet today.  H/H stable at 7.6/24.  No overt bleeding per patient.  Pathology from EGD reviewed and notable for non-H. pylori gastritis and parietal cell hypertrophy, consistent with PPI use.  Objective: Vital signs in last 24 hours: Temp:  [97.6 F (36.4 C)-98.3 F (36.8 C)] 98.3 F (36.8 C) (02/22 0429) Pulse Rate:  [75-84] 80 (02/22 0900) Resp:  [15-18] 18 (02/22 0729) BP: (107-170)/(69-124) 144/78 (02/22 0900) SpO2:  [97 %-99 %] 97 % (02/22 0429) Weight:  [100 kg] 118 kg (02/22 0429) Last BM Date: 04/03/20 General: NAD Abdomen:  Soft, NT, ND, +BS  Intake/Output from previous day: 02/21 0701 - 02/22 0700 In: 1440 [P.O.:1440] Out: 1300 [Urine:1300] Intake/Output this shift: Total I/O In: 720 [P.O.:720] Out: -    Lab Results: Recent Labs    04/03/20 0239 04/03/20 1103 04/04/20 0910  WBC 6.5 6.8 6.2  HGB 7.5* 7.5* 7.6*  PLT 262 277 280  MCV 92.0 93.7 94.2   BMET Recent Labs    04/02/20 0720 04/03/20 0239 04/04/20 0910  NA 134* 133* 134*  K 4.3 4.3 4.3  CL 94* 95* 96*  CO2 33* 33* 31  GLUCOSE 233* 112* 158*  BUN 32* 33* 24*  CREATININE 1.70* 1.86* 1.54*  CALCIUM 8.8* 8.7* 8.9   LFT No results for input(s): PROT, ALBUMIN, AST, ALT, ALKPHOS, BILITOT, BILIDIR, IBILI in the last 72 hours. PT/INR No results for input(s): INR in the last 72 hours.    Imaging/Other results: No results found.    Assessment and Plan:  1) Acute on chronic anemia 2) FOBT positive stool 3) Erosive gastropathy -Plan for continued clears today and additional bowel prep tonight for procedures tomorrow -Start with colonoscopy.  If unrevealing, plan for push enteroscopy +/- VCE -Continue Plavix hold -Clears today and n.p.o. midnight (except prep) -Continue Protonix for gastric erosions  4) History of CAD/NSTEMI 5) History of  CVA -Holding Plavix as above pending endoscopic findings   Discussed this plan with the patient today along with his daughter by phone.  All in agreement with plan as above.   Lavena Bullion, DO  04/04/2020, 1:28 PM [facility] Pager [phone]

## 2020-04-04 NOTE — Care Management Important Message (Signed)
Important Message  Patient Details  Name: Cory Weber MRN: [id number] Date of Birth: May 12, 1950   Medicare Important Message Given:  Yes     Shelda Altes 04/04/2020, 8:30 AM

## 2020-04-04 NOTE — Progress Notes (Addendum)
[facility] ROUNDING NOTE   Subjective: No acute events overnight.  Tolerated first portion of bowel prep without issue.  Liquid diet today.  H/H stable at 7.6/24.  No overt bleeding per patient.  Pathology from EGD reviewed and notable for non-H. pylori gastritis and parietal cell hypertrophy, consistent with PPI use.  Objective: Vital signs in last 24 hours: Temp:  [97.6 F (36.4 C)-98.3 F (36.8 C)] 98.3 F (36.8 C) (02/22 0429) Pulse Rate:  [75-84] 80 (02/22 0900) Resp:  [15-18] 18 (02/22 0729) BP: (107-170)/(69-124) 144/78 (02/22 0900) SpO2:  [97 %-99 %] 97 % (02/22 0429) Weight:  [771 kg] 118 kg (02/22 0429) Last BM Date: 04/03/20 General: NAD Abdomen:  Soft, NT, ND, +BS  Intake/Output from previous day: 02/21 0701 - 02/22 0700 In: 1440 [P.O.:1440] Out: 1300 [Urine:1300] Intake/Output this shift: Total I/O In: 720 [P.O.:720] Out: -    Lab Results: Recent Labs    04/03/20 0239 04/03/20 1103 04/04/20 0910  WBC 6.5 6.8 6.2  HGB 7.5* 7.5* 7.6*  PLT 262 277 280  MCV 92.0 93.7 94.2   BMET Recent Labs    04/02/20 0720 04/03/20 0239 04/04/20 0910  NA 134* 133* 134*  K 4.3 4.3 4.3  CL 94* 95* 96*  CO2 33* 33* 31  GLUCOSE 233* 112* 158*  BUN 32* 33* 24*  CREATININE 1.70* 1.86* 1.54*  CALCIUM 8.8* 8.7* 8.9   LFT No results for input(s): PROT, ALBUMIN, AST, ALT, ALKPHOS, BILITOT, BILIDIR, IBILI in the last 72 hours. PT/INR No results for input(s): INR in the last 72 hours.    Imaging/Other results: No results found.    Assessment and Plan:  1) Acute on chronic anemia 2) FOBT positive stool 3) Erosive gastropathy -Plan for continued clears today and additional bowel prep tonight for procedures tomorrow -Start with colonoscopy.  If unrevealing, plan for push enteroscopy +/- VCE -Continue Plavix hold -Clears today and n.p.o. midnight (except prep) -Continue Protonix for gastric erosions  4) History of CAD/NSTEMI 5) History of  CVA -Holding Plavix as above pending endoscopic findings   Discussed this plan with the patient today along with his daughter by phone.  All in agreement with plan as above.   Lavena Bullion, DO  04/04/2020, 1:28 PM [facility] Pager [phone]

## 2020-04-04 NOTE — Progress Notes (Signed)
Occupational Therapy Treatment Patient Details Name: NYLE LIMB MRN: [id number] DOB: Jul 23, 1950 Today's Date: 04/04/2020    History of present illness Pt adm with acute hypoxic respiratory failure secondary to heart failure exacerbation and NSTEMI. Cardiac cath 2/8 severe complex CAD. Pt not candidate for CABG or PCI. PMH - bil BKA 2017/2018, ckd, cva, hep C, asthma with 2L O2  baseline. 1 unit PRBC on 2/17. Plan for Colonoscopy 2/18.   OT comments  Pt making steady progress towards OT goals this session. Session focus on functional mobility via continued transfer training. Pt asleep upon arrival but easily able to arouse and agreeable to OT intervention. Pt noted to be saturated in urine needing MIN A to roll in supine to change bed pads and total A for posterior pericare in sidelying. Pt completed anterior/ posterior transfer into recliner with MIN A +2 with use of bed pad to scoot backwards into recliner. Pt would continue to benefit from skilled occupational therapy while admitted and after d/c to address the below listed limitations in order to improve overall functional mobility and facilitate independence with BADL participation. DC plan remains appropriate, will follow acutely per POC.     Follow Up Recommendations  Other (comment);Home health OT (return to SNF)    Equipment Recommendations  None recommended by OT    Recommendations for Other Services      Precautions / Restrictions Precautions Precautions: Fall Restrictions Weight Bearing Restrictions: No Other Position/Activity Restrictions: bil BKA       Mobility Bed Mobility Overal bed mobility: Needs Assistance Bed Mobility: Rolling Rolling: Min assist   Supine to sit: Min assist (from supine>long sitting)     General bed mobility comments: light MIN A to roll fully to pts R side, pt able to transition into long sitting with MIN A with pt pulling forward on therapists to lift trunk off back of bed.     Transfers Overall transfer level: Needs assistance Equipment used: None Transfers: Comptroller transfers: From elevated surface;Min assist;+2 safety/equipment   General transfer comment: pt completed anterior/ posterior transfer from EOB>recliner with MIN A +2 and use of bed pad to help facilitate backwards scooting into recliner, pt required cues for hand placement throughout transfer    Balance Overall balance assessment: Needs assistance Sitting-balance support: No upper extremity supported;Feet unsupported Sitting balance-Leahy Scale: Fair Sitting balance - Comments: in long sit, needs supervision to min guard for static sitting, +61minA for posterior scooting                                   ADL either performed or assessed with clinical judgement   ADL Overall ADL's : Needs assistance/impaired                         Toilet Transfer: Minimal assistance;+2 for physical assistance Toilet Transfer Details (indicate cue type and reason): simulated via anterior posterior transfer from EOB>recliner, MIN A +2 to scoot backwards into recliner with use of bed pad Toileting- Clothing Manipulation and Hygiene: Total assistance;Bed level Toileting - Clothing Manipulation Details (indicate cue type and reason): total A for posterior pericare from bed level     Functional mobility during ADLs: Minimal assistance;+2 for physical assistance General ADL Comments: pt able to progress OOB to recliner via anterior/ posterior transfer into recliner     Vision  Perception     Praxis      Cognition Arousal/Alertness: Lethargic;Awake/alert (initially lethargic upon arrival, aroused more as session progressed) Behavior During Therapy: WFL for tasks assessed/performed Overall Cognitive Status: Impaired/Different from baseline Area of Impairment: Safety/judgement;Problem solving                          Safety/Judgement: Decreased awareness of deficits   Problem Solving: Decreased initiation General Comments: pt noted to be alittle self limiting declining practicing using urinal as pt wanting to just use primo fit vs working towards independence, stating "its too much"        Exercises     Shoulder Instructions       General Comments pt on 2L during session via [place] with sats 97% post mobility    Pertinent Vitals/ Pain       Pain Assessment: No/denies pain  Home Living                                          Prior Functioning/Environment              Frequency  Min 2X/week        Progress Toward Goals  OT Goals(current goals can now be found in the care plan section)  Progress towards OT goals: Progressing toward goals  Acute Rehab OT Goals Patient Stated Goal: to get to out of wet bed OT Goal Formulation: With patient Potential to Achieve Goals: Good  Plan Discharge plan remains appropriate;Frequency remains appropriate    Co-evaluation                 AM-PAC OT "6 Clicks" Daily Activity     Outcome Measure   Help from another person eating meals?: None Help from another person taking care of personal grooming?: A Little Help from another person toileting, which includes using toliet, bedpan, or urinal?: A Lot Help from another person bathing (including washing, rinsing, drying)?: A Lot Help from another person to put on and taking off regular upper body clothing?: A Little Help from another person to put on and taking off regular lower body clothing?: A Lot 6 Click Score: 16    End of Session Equipment Utilized During Treatment: Oxygen;Other (comment) (2L [place])  OT Visit Diagnosis: Other abnormalities of gait and mobility (R26.89);Muscle weakness (generalized) (M62.81);Pain   Activity Tolerance Patient tolerated treatment well   Patient Left in chair;with call bell/phone within reach;with chair alarm set   Nurse  Communication Mobility status;Other (comment) (back to bed via anterior/posterior transfer with +2 assist)        Time: 8756-4332 OT Time Calculation (min): 25 min  Charges: OT General Charges $OT Visit: 1 Visit OT Treatments $Self Care/Home Management : 23-37 mins  [name], COTA/L Acute Rehabilitation Services [phone]    Precious Haws 04/04/2020, 9:32 AM

## 2020-04-04 NOTE — Progress Notes (Signed)
PROGRESS NOTE    Cory Weber  RWE:[id number] DOB: Oct 16, 1950 DOA: 03/20/2020 PCP: Iona Beard, MD   Brief Narrative: 69/M resident of SNF, medical history significant for CKD, CVA, hepatitis C, bilateral BKA, hyperlipidemia, BPH, GERD, anemia, asthma who presented complaining of acute onset shortness of breath, he is on 2 L home O2 at baseline, was hypoxic with sats in the 70s upon arrival to the ED. -Also history of intermittent black stools few weeks ago -Admitted with acute hypoxic respiratory failure secondary to heart failure exacerbation, non-STEMI and anemia -Patient was transferred from [facility] to [facility] for cardiac catheterization. Cardiac cath performed 2/8 showed:  Severe complex CAD. ,  Medical management recommended, diuresed with IV Lasix and then transition to oral diuretics  -Also noted to have worsening of chronic anemia, transfused 3 units of PRBC this admission  Assessment & Plan:   NSTEMI: -Admitted with pulmonary edema, troponin significantly elevated at 12 K,  treated with heparin drip, nitrates  -cardiology consulted, patient underwent cardiac catheterization 12/8 which showed: Severe complex CAD. There is a tapering 70% stenosis in the distal Left main involving as well the ostium of the LAD and ramus intermediate with hazy appearance. The LCx is a co-dominant vessel with extensive dissection involving the ostium and proximal vessel and is is subtotally occluded. -Medical management recommended, not amenable to PCI, not a good candidate for CABG per cardiology -Continue aspirin, Coreg, Imdur-high-dose now  Acute systolic/diastolic heart failure exacerbation with acute hypoxic respiratory failure and pulmonary edema: Hypertensive urgency -Profoundly hypoxic with sats in the 70s on admission, required nonrebreather mask, secondary to pulmonary edema -Echo ejection fraction 40 to 45%. -improved with diuretics, transitioned to oral lasix 2/15.   -Continue hydralazine Norvasc and Coreg -Patient had another episode of chest pain, dyspnea over the weekend resolved after additional Lasix -Stable now, continue Lasix 80 mg daily, unfortunately hemoglobin continues to drift down  Anemia of chronic disease and iron deficiency: Hemoglobin baseline 9 -Transfused 3 units of PRBC this admission for hemoglobin around 7, Hemoccult positive on admission, anemia panel suggestive of chronic disease  and mild iron deficiency, Plavix was held  -Gastroenterology consulted, underwent endoscopy 2/18  which noted some erosive gastropathy and erythematous mucosa in the fundus, unfortunately patient is hadnt completed his bowel prep, so gastroenterology recommended colonoscopy in a few weeks as outpatient with Dr. Gala Romney, okay to resume Plavix,  -Hemoglobin has slowly drifted down to 7.5/7.6 range, given Aranesp and IV iron as well  -Logistically challenging for outpatient colonoscopy as he is bilateral amputee/SNF resident, discussed with GI again, started 2-day prep for colonoscopy, planned for tomorrow now -Trend CBC  Hyperlipidemia: Continue with statin  history of CVA: Continue with aspirin statin  -Resume Plavix at discharge if Hb stabilizes  GERD: Continue PPI  Hepatitis C: needs to  follow-up as an outpatient  Diabetes type 2 with nephropathy: -Continue with Lantus and sliding scale insulin.  Peripheral vascular disease: -Status post left BKA 2017, right BKA 2018  Morbid obesity BMI 42 -Need lifestyle modification  Chronic kidney disease a stage IIIa: -stable around 1.7- 1.8  Estimated body mass index is 40.74 kg/m as calculated from the following:   Height as of this encounter: 5\' 7"  (1.702 m).   Weight as of this encounter: 118 kg.   DVT prophylaxis: hold heparin subcutaneous Code Status: DNR Family Communication: no family at bedside Disposition Plan:  Status is: Inpatient  Remains inpatient appropriate because:Ongoing active  pain requiring inpatient pain  management   Dispo: The patient is from: SNF              Anticipated d/c is to: SNF              Anticipated d/c date is: 2/24              Patient currently is not medically stable to d/c.   Difficult to place patient No   Consultants:   Cardiology   Procedures:    CATH; Mid LM to Dist LM lesion is 70% stenosed.  Ost LAD to Prox LAD lesion is 70% stenosed.  Ramus lesion is 70% stenosed.  Ost Cx to Mid Cx lesion is 99% stenosed.  Prox RCA lesion is 40% stenosed.  There is moderate left ventricular systolic dysfunction.  LV end diastolic pressure is severely elevated.  The left ventricular ejection fraction is 35-45% by visual estimate.   1. Severe complex CAD. There is a tapering 70% stenosis in the distal Left main involving as well the ostium of the LAD and ramus intermediate with hazy appearance. The LCx is a co-dominant vessel with extensive dissection involving the ostium and proximal vessel and is is subtotally occluded. 2. Moderate LV dysfunction. EF 40-45% 3. Severely elevated LVEDP 30 mm Hg.  Endoscopy Dr. Fuller Plan Impression:               - Normal esophagus.                           - Erosive gastropathy with no bleeding and no                            stigmata of recent bleeding. Biopsied.                           - Erythematous mucosa in the gastric fundus.                            Biopsied.                           - Normal duodenal bulb and second portion of the                            duodenum. Recommendation:           - Return patient to hospital ward for ongoing care.                           - Unfortunately he was unable to complete the bowel                            prep. He was passing solid brown stool this morning                            so his colonoscopy was canceled.                           - Resume previous diet.                           -  Continue present medications including                             pantoprazole 40 mg po qd.                           - Await pathology results.                           - Consider colonoscopy in a few weeks after further                            recovery post NSTEMI.                           - Outpatient GI follow up with Dr. Gala Romney.                           - Resume Plavix (clopidogrel) at prior dose.                       Antimicrobials:    Subjective: -Feels okay, did not have any chest pain overnight,  Objective: Vitals:   04/04/20 0429 04/04/20 0630 04/04/20 0729 04/04/20 0900  BP: (!) 149/124 (!) 170/97 135/71 (!) 144/78  Pulse: 84  84 80  Resp: 16 18 18    Temp: 98.3 F (36.8 C)     TempSrc: Oral     SpO2: 97%     Weight: 118 kg     Height:        Intake/Output Summary (Last 24 hours) at 04/04/2020 1155 Last data filed at 04/04/2020 0900 Gross per 24 hour  Intake 1800 ml  Output 1050 ml  Net 750 ml   Filed Weights   04/03/20 0026 04/03/20 0500 04/04/20 0429  Weight: 119.9 kg 119.9 kg 118 kg    Examination:  General exam: Chronically ill pleasant male sitting up in bed, awake alert oriented x3, no distress CVS: S1-S2, regular rate rhythm Lungs: Few basilar rales, otherwise clear Abdomen: Soft, nontender, bowel sounds present Extremities: Bilateral below-knee amputation Skin: no rashes on exposed skin  Data Reviewed: I have personally reviewed following labs and imaging studies  CBC: Recent Labs  Lab 04/01/20 0430 04/02/20 0720 04/03/20 0239 04/03/20 1103 04/04/20 0910  WBC 7.8 6.6 6.5 6.8 6.2  HGB 8.2* 7.8* 7.5* 7.5* 7.6*  HCT 25.4* 25.2* 22.9* 23.7* 24.3*  MCV 93.4 95.1 92.0 93.7 94.2  PLT 298 260 262 277 865   Basic Metabolic Panel: Recent Labs  Lab 03/31/20 0542 04/01/20 0430 04/02/20 0720 04/03/20 0239 04/04/20 0910  NA 135 133* 134* 133* 134*  K 4.4 4.6 4.3 4.3 4.3  CL 97* 96* 94* 95* 96*  CO2 31 31 33* 33* 31  GLUCOSE 109* 201* 233* 112* 158*  BUN 30* 28* 32* 33* 24*  CREATININE  1.78* 1.69* 1.70* 1.86* 1.54*  CALCIUM 8.7* 8.9 8.8* 8.7* 8.9   GFR: Estimated Creatinine Clearance: 57.2 mL/min (A) (by C-G formula based on SCr of 1.54 mg/dL (H)). Liver Function Tests: No results for input(s): AST, ALT, ALKPHOS, BILITOT, PROT, ALBUMIN in the last 168 hours. No results for input(s): LIPASE, AMYLASE in the last 168 hours. No results for input(s): AMMONIA in the last 168  hours. Coagulation Profile: No results for input(s): INR, PROTIME in the last 168 hours. Cardiac Enzymes: No results for input(s): CKTOTAL, CKMB, CKMBINDEX, TROPONINI in the last 168 hours. BNP (last 3 results) No results for input(s): PROBNP in the last 8760 hours. HbA1C: No results for input(s): HGBA1C in the last 72 hours. CBG: Recent Labs  Lab 04/04/20 0309 04/04/20 0324 04/04/20 0428 04/04/20 0618 04/04/20 1144  GLUCAP 68* 91 101* 87 137*   Lipid Profile: No results for input(s): CHOL, HDL, LDLCALC, TRIG, CHOLHDL, LDLDIRECT in the last 72 hours. Thyroid Function Tests: No results for input(s): TSH, T4TOTAL, FREET4, T3FREE, THYROIDAB in the last 72 hours. Anemia Panel: No results for input(s): VITAMINB12, FOLATE, FERRITIN, TIBC, IRON, RETICCTPCT in the last 72 hours. Sepsis Labs: No results for input(s): PROCALCITON, LATICACIDVEN in the last 168 hours.  No results found for this or any previous visit (from the past 240 hour(s)).   Scheduled Meds: . amLODipine  10 mg Oral Daily  . vitamin C  500 mg Oral BID  . aspirin EC  325 mg Oral Daily  . bisacodyl  10 mg Oral BID  . carvedilol  25 mg Oral BID WC  . DULoxetine  60 mg Oral Daily  . furosemide  80 mg Oral Daily  . hydrALAZINE  50 mg Oral Q8H  . insulin aspart  0-15 Units Subcutaneous TID WC  . insulin aspart  0-5 Units Subcutaneous QHS  . insulin aspart  8 Units Subcutaneous TID AC  . insulin glargine  35 Units Subcutaneous QHS  . isosorbide mononitrate  120 mg Oral Daily  . lidocaine  1 patch Transdermal Daily  .  pantoprazole  40 mg Oral Daily  . pregabalin  75 mg Oral Daily  . rosuvastatin  20 mg Oral Daily  . senna  1 tablet Oral Daily  . sodium chloride flush  3 mL Intravenous Q12H  . sodium chloride flush  3 mL Intravenous Q12H  . tamsulosin  0.4 mg Oral Daily  . cyanocobalamin  500 mcg Oral Daily   Continuous Infusions: . sodium chloride       LOS: 15 days    Time spent: 25 minutes.   Domenic Polite, MD [facility]  04/04/2020, 11:55 AM

## 2020-04-04 NOTE — Progress Notes (Signed)
Bowel prep finished for tonight. Pt continuing to have type 6 stools. For endoscopy on 2/23 per MD notes

## 2020-04-05 ENCOUNTER — Encounter (HOSPITAL_COMMUNITY): Payer: Self-pay | Admitting: Family Medicine

## 2020-04-05 ENCOUNTER — Encounter (HOSPITAL_COMMUNITY)
Admission: EM | Disposition: A | Discharge status: Skilled Nursing Facility | Payer: Self-pay | Source: Home / Self Care | Attending: Internal Medicine

## 2020-04-05 ENCOUNTER — Inpatient Hospital Stay (HOSPITAL_COMMUNITY): Payer: Medicare (Managed Care) | Admitting: Anesthesiology

## 2020-04-05 DIAGNOSIS — K640 First degree hemorrhoids: Secondary | ICD-10-CM

## 2020-04-05 DIAGNOSIS — K2970 Gastritis, unspecified, without bleeding: Secondary | ICD-10-CM

## 2020-04-05 DIAGNOSIS — K314 Gastric diverticulum: Secondary | ICD-10-CM

## 2020-04-05 DIAGNOSIS — D62 Acute posthemorrhagic anemia: Secondary | ICD-10-CM

## 2020-04-05 HISTORY — PX: GIVENS CAPSULE STUDY: SHX5432

## 2020-04-05 HISTORY — PX: ENTEROSCOPY: SHX5533

## 2020-04-05 HISTORY — PX: COLONOSCOPY WITH PROPOFOL: SHX5780

## 2020-04-05 LAB — GLUCOSE, CAPILLARY
Glucose-Capillary: 57 mg/dL — ABNORMAL LOW (ref 70–99)
Glucose-Capillary: 86 mg/dL (ref 70–99)
Glucose-Capillary: 68 mg/dL — ABNORMAL LOW (ref 70–99)
Glucose-Capillary: 91 mg/dL (ref 70–99)
Glucose-Capillary: 69 mg/dL — ABNORMAL LOW (ref 70–99)
Glucose-Capillary: 111 mg/dL — ABNORMAL HIGH (ref 70–99)
Glucose-Capillary: 154 mg/dL — ABNORMAL HIGH (ref 70–99)
Glucose-Capillary: 180 mg/dL — ABNORMAL HIGH (ref 70–99)

## 2020-04-05 LAB — BASIC METABOLIC PANEL
Anion gap: 9 (ref 5–15)
BUN: 21 mg/dL (ref 8–23)
CO2: 30 mmol/L (ref 22–32)
Calcium: 8.9 mg/dL (ref 8.9–10.3)
Chloride: 97 mmol/L — ABNORMAL LOW (ref 98–111)
Creatinine, Ser: 1.5 mg/dL — ABNORMAL HIGH (ref 0.61–1.24)
GFR, Estimated: 51 mL/min — ABNORMAL LOW (ref >60)
Glucose, Bld: 47 mg/dL — ABNORMAL LOW (ref 70–99)
Potassium: 4.2 mmol/L (ref 3.5–5.1)
Sodium: 136 mmol/L (ref 135–145)

## 2020-04-05 LAB — CBC
HCT: 26.3 % — ABNORMAL LOW (ref 39.0–52.0)
Hemoglobin: 8.1 g/dL — ABNORMAL LOW (ref 13.0–17.0)
MCH: 30 pg (ref 26.0–34.0)
MCHC: 30.8 g/dL (ref 30.0–36.0)
MCV: 97.4 fL (ref 80.0–100.0)
Platelets: 314 10*3/uL (ref 150–400)
RBC: 2.7 MIL/uL — ABNORMAL LOW (ref 4.22–5.81)
RDW: 15 % (ref 11.5–15.5)
WBC: 6.7 10*3/uL (ref 4.0–10.5)
nRBC: 0.4 % — ABNORMAL HIGH (ref 0.0–0.2)

## 2020-04-05 LAB — HCV RNA QUANT: HCV Quantitative: NOT DETECTED IU/mL (ref >50)

## 2020-04-05 SURGERY — ENTEROSCOPY
Anesthesia: Monitor Anesthesia Care

## 2020-04-05 SURGERY — COLONOSCOPY WITH PROPOFOL
Anesthesia: Monitor Anesthesia Care

## 2020-04-05 MED ORDER — LIDOCAINE 2% (20 MG/ML) 5 ML SYRINGE
INTRAMUSCULAR | Status: DC | PRN
  Administered 2020-04-05: 60 mg via INTRAVENOUS

## 2020-04-05 MED ORDER — SODIUM CHLORIDE 0.9 % IV SOLN: INTRAVENOUS | Status: DC | PRN

## 2020-04-05 MED ORDER — DEXTROSE 50 % IV SOLN
INTRAVENOUS | Status: AC
  Administered 2020-04-05: 25 mL
  Filled 2020-04-05: qty 50

## 2020-04-05 MED ORDER — FLUCONAZOLE 100 MG PO TABS
100.0000 mg | ORAL_TABLET | Freq: Every day | ORAL | Status: DC
  Filled 2020-04-05: qty 1

## 2020-04-05 MED ORDER — DEXTROSE 50 % IV SOLN
12.5000 g | INTRAVENOUS | Status: AC
  Administered 2020-04-05: 12.5 g via INTRAVENOUS
  Filled 2020-04-05: qty 50

## 2020-04-05 MED ORDER — FLUCONAZOLE 200 MG PO TABS
200.0000 mg | ORAL_TABLET | Freq: Once | ORAL | Status: AC
  Administered 2020-04-05: 200 mg via ORAL
  Filled 2020-04-05: qty 1

## 2020-04-05 MED ORDER — FLUCONAZOLE 100 MG PO TABS: 100.0000 mg | ORAL_TABLET | Freq: Every day | ORAL | Status: DC

## 2020-04-05 MED ORDER — PROPOFOL 10 MG/ML IV BOLUS
INTRAVENOUS | Status: DC | PRN
Start: 2020-04-05 — End: 2020-04-05
  Administered 2020-04-05: 30 mg via INTRAVENOUS

## 2020-04-05 MED ORDER — PROPOFOL 500 MG/50ML IV EMUL
INTRAVENOUS | Status: DC | PRN
  Administered 2020-04-05: 75 ug/kg/min via INTRAVENOUS

## 2020-04-05 SURGICAL SUPPLY — 22 items

## 2020-04-05 NOTE — Progress Notes (Signed)
Inpatient Diabetes Program Recommendations  AACE/ADA: New Consensus Statement on Inpatient Glycemic Control (2015)  Target Ranges:  Prepandial:   less than 140 mg/dL      Peak postprandial:   less than 180 mg/dL (1-2 hours)      Critically ill patients:  140 - 180 mg/dL   Lab Results  Component Value Date   GLUCAP 91 04/05/2020   HGBA1C 6.6 (H) 03/20/2020    Review of Glycemic Control Results for Cory Weber, Cory Weber (MRN [id number]) as of 04/05/2020 09:19  Ref. Range 04/04/2020 06:18 04/04/2020 08:04 04/04/2020 11:44 04/04/2020 16:32 04/04/2020 21:01 04/05/2020 03:15 04/05/2020 03:44 04/05/2020 06:15 04/05/2020 06:44  Glucose-Capillary Latest Ref Range: 70 - 99 mg/dL 87 173 (H) 137 (H) 176 (H) 94 57 (L) 86 68 (L) 91   Home DM Meds: Lantus 30 units QHS                             Novolog 8 units TID                             Metformin 850 mg BID  Current Orders: Lantus 35 units QHS                             Novolog 0-15 units ac/hs                             Novolog 4 units TID with meals  Inpatient Diabetes Program Recommendations:   -Decrease Lantus to 15 units tonight due to NPO for procedure tomorrow. -D/C Novolog meal coverage -Decrease Novolog correction to sensitive 0-9 units Secure chat sent to Dr. Tyrell Antonio.  Thank you, Nani Gasser. Vaniah Chambers, RN, MSN, CDE  Diabetes Coordinator Inpatient Glycemic Control Team Team Pager [phone] (8am-5pm) 04/05/2020 9:24 AM

## 2020-04-05 NOTE — Anesthesia Preprocedure Evaluation (Addendum)
Anesthesia Evaluation  Patient identified by MRN, date of birth, ID band Patient awake    Reviewed: Allergy & Precautions, H&P , NPO status , Patient's Chart, lab work & pertinent test results  Airway Mallampati: II  TM Distance: >3 FB Neck ROM: Full    Dental  (+) Edentulous Upper, Edentulous Lower   Pulmonary asthma , former smoker,    Pulmonary exam normal breath sounds clear to auscultation       Cardiovascular hypertension, Pt. on medications + Past MI, + Peripheral Vascular Disease and +CHF  Normal cardiovascular exam Rhythm:Regular Rate:Normal     Neuro/Psych PSYCHIATRIC DISORDERS CVA, No Residual Symptoms    GI/Hepatic GERD  ,(+) Hepatitis -, C  Endo/Other  diabetes, Type 2  Renal/GU Renal Insufficiency and CRFRenal diseaseCr 1.5  negative genitourinary   Musculoskeletal  (+) Arthritis , Osteoarthritis,    Abdominal (+) + obese,   Peds negative pediatric ROS (+)  Hematology  (+) Blood dyscrasia, anemia , 8.1/26.3   Anesthesia Other Findings presented complaining of acute onset shortness of breath, he is on 2 L home O2 at baseline, was hypoxic with sats in the 70s upon arrival to the ED. -Also history of intermittent black stools few weeks ago -Admitted with acute hypoxic respiratory failure secondary to heart failure exacerbation, non-STEMI and anemia -Patient was [facility] to [facility] for cardiac catheterization. Cardiac cath performed 2/8 showed:  Severe complex CAD. ,  Medical management recommended, diuresed with IV Lasix and then transition to oral diuretics  -Also noted to have worsening of chronic anemia, transfused 3 units of PRBC this admission  Reproductive/Obstetrics negative OB ROS                           Anesthesia Physical Anesthesia Plan  ASA: IV  Anesthesia Plan: MAC   Post-op Pain Management:    Induction:   PONV Risk Score and  Plan: 2 and Propofol infusion and TIVA  Airway Management Planned: Natural Airway and Simple Face Mask  Additional Equipment: None  Intra-op Plan:   Post-operative Plan:   Informed Consent: I have reviewed the patients History and Physical, chart, labs and discussed the procedure including the risks, benefits and alternatives for the proposed anesthesia with the patient or authorized representative who has indicated his/her understanding and acceptance.       Plan Discussed with: CRNA  Anesthesia Plan Comments:         Anesthesia Quick Evaluation

## 2020-04-05 NOTE — Transfer of Care (Signed)
Immediate Anesthesia Transfer of Care Note  Patient: Cory Weber  Procedure(s) Performed: ENTEROSCOPY (Left ) COLONOSCOPY WITH PROPOFOL (N/A ) GIVENS CAPSULE STUDY (N/A )  Patient Location: PACU and Endoscopy Unit  Anesthesia Type:MAC  Level of Consciousness: drowsy  Airway & Oxygen Therapy: Patient Spontanous Breathing and Patient connected to nasal cannula oxygen  Post-op Assessment: Report given to RN and Post -op Vital signs reviewed and stable  Post vital signs: Reviewed and stable  Last Vitals:  Vitals Value Taken Time  BP 164/64 04/05/20 1102  Temp 36.7 C 04/05/20 1102  Pulse 89 04/05/20 1111  Resp 19 04/05/20 1111  SpO2 93 % 04/05/20 1111  Vitals shown include unvalidated device data.  Last Pain:  Vitals:   04/05/20 1102  TempSrc: Axillary  PainSc: Asleep      Patients Stated Pain Goal: 2 (20/23/34 3568)  Complications: No complications documented.

## 2020-04-05 NOTE — Progress Notes (Signed)
PROGRESS NOTE    Cory Weber  UQJ:[id number] DOB: 07-29-1950 DOA: 03/20/2020 PCP: Iona Beard, MD   Brief Narrative: 70 year old with past medical history significant for CKD, CVA, hepatitis C, bilateral BKA, hyperlipidemia, BPH, anemia, asthma who presents complaining of acute onset shortness of breath, he is on chronically 2 L of oxygen at baseline, was hypoxic with sats in the 70s upon arrival to the ED. -Patient with a history of intermittent black stool few weeks prior to admission. -Admitted with acute hypoxic respiratory failure secondary to heart failure exacerbation, non-STEMI and anemia. -Patient was transferred from [facility] to [facility] for cardiac catheterization. -Cardiac cath performed 12/8 show: Severe complex CAD, medical management recommended, diuresis with IV Lasix and then transition to oral diuretics. -Patient was also noted to have worsening of chronic anemia, he has received 3 units of packed red blood cells this admission.   Assessment & Plan:   Principal Problem:   NSTEMI (non-ST elevated myocardial infarction) ([place]) Active Problems:   Polysubstance abuse ([place])   Tobacco abuse   Melena   Chronic kidney disease, stage 3 (HCC)   Essential hypertension   Chronic hepatitis C without hepatic coma (HCC)   History of CVA (cerebrovascular accident) without residual deficits   Phantom limb pain (HCC)   Anemia of chronic disease   PVD (peripheral vascular disease) (HCC)   Amputation of left lower extremity below knee (HCC)   GERD (gastroesophageal reflux disease)   Edema of right lower extremity   Uncontrolled type II diabetes with peripheral autonomic neuropathy (HCC)   Dyslipidemia associated with type 2 diabetes mellitus (HCC)   Physical deconditioning   Anemia, chronic disease   Chronic diastolic CHF (congestive heart failure) (HCC)   PAD (peripheral artery disease) ([place])   DNR (do not resuscitate)   Heart failure ([place])   Occult blood in  stools   Gastritis and gastroduodenitis   Gastric diverticulum   Grade I internal hemorrhoids  1-non-STEMI -Admitted with pulmonary edema, troponin significantly elevated at 12 K, treated with heparin drip and nitrates. -Cardiology consulted, patient underwent cardiac catheterization on 2/8 which show: Severe complex CAD.  There is a tapering 70% stenosis of the distal left main involving as well as the ostium of the LAD and ramus intermediate with hazy appearance.  The LCx is codominant vessel with extensive dissection involving the ostium and proximal vessel and is subtotally occluded. Medical management recommended, and no amenable to PCI, not a good candidate for CABG per cardiology. -Continue with aspirin, Coreg, indoor high-dose.  2-Acute Systolic/Diastolic Heart failure Exacerbation with Acute Hypoxic Respiratory Failure and Pulmonary Edema: -Patient presented with oxygen saturation in the 70s on admission, required nonrebreather mask, secondary to pulmonary edema. -Echo ejection fraction 40 to 45%. -Improved with diuretics, transition to oral Lasix 12/15. -Continue with hydralazine Norvasc and Coreg.  3-Anemia of chronic disease and iron deficiency:  -Hb baseline 9.  -Patient has received 3 units of packed red blood cells this admission.  Heme "positive on admission.  Anemia panel suggestive of chronic disease some mild iron deficiency. -Gastroenterology is consulted, underwent endoscopy 2/18 which noticed some erosive gastropathy and erythematosus mucosa in the fundus, unfortunately patient did not completed his bowel prep.  Gastroenterology is recommending colonoscopy in a few weeks as outpatient with Dr. Gala Romney, okay to resume Plavix. -Hemoglobin has slowly drifted down to 7.5.  He received redness and IV iron as well. -GI consulted for inpatient colonoscopy, logistically challenging for outpatient colonoscopy. -Colonoscopy 2/23: Entire colon  was normal. -Upper GI endoscopy and  video capsule endoscopy 2/23; esophageal plaques, suspicious for candidiasis. Gastritis.  -started on fluconazole. Needs 3 weeks.   4-Hyperlipidemia: Continue with the statins.  5-history of CVA: Continue with aspirin and statin. Plan to resume Plavix at discharge if hemoglobin is stabilized.  6-GERD: Continue with PPI Therapy see: Needs to follow-up as an outpatient.  7-Diabetes type 2 with nephropathy: Holding Lantus today due to low blood sugar.  Continue with sliding scale insulin Peripheral vascular disease: Status post left BKA 2017, right BKA 2018 Morbid Obesity BMI 42 needs lifestyle modification.  CKD stage III AAA: Stable around 1.7--- 1.8   Estimated body mass index is 41.09 kg/m as calculated from the following:   Height as of this encounter: 5\' 7"  (1.702 m).   Weight as of this encounter: 119 kg.   DVT prophylaxis: Holding heparin subc due to low hb.  Code Status: DNR Family Communication: No family at bedside.  Disposition Plan:  Status is: Inpatient  Remains inpatient appropriate because:IV treatments appropriate due to intensity of illness or inability to take PO   Dispo: The patient is from: SNF              Anticipated d/c is to: SNF              Anticipated d/c date is: 2 days              Patient currently is not medically stable to d/c.   Difficult to place patient No        Consultants:   GI  Cardiology   Procedures:    CATH; Mid LM to Dist LM lesion is 70% stenosed.  Ost LAD to Prox LAD lesion is 70% stenosed.  Ramus lesion is 70% stenosed.  Ost Cx to Mid Cx lesion is 99% stenosed.  Prox RCA lesion is 40% stenosed.  There is moderate left ventricular systolic dysfunction.  LV end diastolic pressure is severely elevated.  The left ventricular ejection fraction is 35-45% by visual estimate.  1. Severe complex CAD. There is a tapering 70% stenosis in the distal Left main involving as well the ostium of the LAD and ramus  intermediate with hazy appearance. The LCx is a co-dominant vessel with extensive dissection involving the ostium and proximal vessel and is is subtotally occluded. 2. Moderate LV dysfunction. EF 40-45% 3. Severely elevated LVEDP 30 mm Hg.     Antimicrobials:    Subjective: Just came from endoscopy. He denies chest pain. He is asking for something to eat,.    Objective: Vitals:   04/05/20 0944 04/05/20 1102 04/05/20 1110 04/05/20 1120  BP: 129/76 (!) 164/64 (!) 185/144 (!) 151/81  Pulse: 83 94 89 85  Resp: 17 17 19 16   Temp: 98.5 F (36.9 C) 98.1 F (36.7 C)    TempSrc: Oral Axillary    SpO2: 99% 98% 91% 98%  Weight: 119 kg     Height: 5\' 7"  (1.702 m)       Intake/Output Summary (Last 24 hours) at 04/05/2020 1218 Last data filed at 04/05/2020 1053 Gross per 24 hour  Intake 880 ml  Output 1350 ml  Net -470 ml   Filed Weights   04/05/20 0006 04/05/20 0450 04/05/20 0944  Weight: 119.1 kg 119.1 kg 119 kg    Examination:  General exam: Appears calm and comfortable  Respiratory system: Clear to auscultation. Respiratory effort normal. Cardiovascular system: S1 & S2 heard. Gastrointestinal system: Abdomen is nondistended, soft  and nontender. No organomegaly or masses felt. Normal bowel sounds heard. Central nervous system: Alert and oriented.  Extremities:  B/L BKA  Data Reviewed: I have personally reviewed following labs and imaging studies  CBC: Recent Labs  Lab 04/02/20 0720 04/03/20 0239 04/03/20 1103 04/04/20 0910 04/05/20 0248  WBC 6.6 6.5 6.8 6.2 6.7  HGB 7.8* 7.5* 7.5* 7.6* 8.1*  HCT 25.2* 22.9* 23.7* 24.3* 26.3*  MCV 95.1 92.0 93.7 94.2 97.4  PLT 260 262 277 280 045   Basic Metabolic Panel: Recent Labs  Lab 04/01/20 0430 04/02/20 0720 04/03/20 0239 04/04/20 0910 04/05/20 0248  NA 133* 134* 133* 134* 136  K 4.6 4.3 4.3 4.3 4.2  CL 96* 94* 95* 96* 97*  CO2 31 33* 33* 31 30  GLUCOSE 201* 233* 112* 158* 47*  BUN 28* 32* 33* 24* 21   CREATININE 1.69* 1.70* 1.86* 1.54* 1.50*  CALCIUM 8.9 8.8* 8.7* 8.9 8.9   GFR: Estimated Creatinine Clearance: 59 mL/min (A) (by C-G formula based on SCr of 1.5 mg/dL (H)). Liver Function Tests: No results for input(s): AST, ALT, ALKPHOS, BILITOT, PROT, ALBUMIN in the last 168 hours. No results for input(s): LIPASE, AMYLASE in the last 168 hours. No results for input(s): AMMONIA in the last 168 hours. Coagulation Profile: No results for input(s): INR, PROTIME in the last 168 hours. Cardiac Enzymes: No results for input(s): CKTOTAL, CKMB, CKMBINDEX, TROPONINI in the last 168 hours. BNP (last 3 results) No results for input(s): PROBNP in the last 8760 hours. HbA1C: No results for input(s): HGBA1C in the last 72 hours. CBG: Recent Labs  Lab 04/05/20 0315 04/05/20 0344 04/05/20 0615 04/05/20 0644 04/05/20 1212  GLUCAP 57* 86 68* 91 69*   Lipid Profile: No results for input(s): CHOL, HDL, LDLCALC, TRIG, CHOLHDL, LDLDIRECT in the last 72 hours. Thyroid Function Tests: No results for input(s): TSH, T4TOTAL, FREET4, T3FREE, THYROIDAB in the last 72 hours. Anemia Panel: No results for input(s): VITAMINB12, FOLATE, FERRITIN, TIBC, IRON, RETICCTPCT in the last 72 hours. Sepsis Labs: No results for input(s): PROCALCITON, LATICACIDVEN in the last 168 hours.  No results found for this or any previous visit (from the past 240 hour(s)).       Radiology Studies: No results found.      Scheduled Meds: . amLODipine  10 mg Oral Daily  . vitamin C  500 mg Oral BID  . aspirin EC  325 mg Oral Daily  . carvedilol  25 mg Oral BID WC  . DULoxetine  60 mg Oral Daily  . fluconazole  100 mg Oral Daily  . furosemide  80 mg Oral Daily  . hydrALAZINE  50 mg Oral Q8H  . insulin aspart  0-15 Units Subcutaneous TID WC  . insulin aspart  0-5 Units Subcutaneous QHS  . insulin aspart  4 Units Subcutaneous TID AC  . isosorbide mononitrate  120 mg Oral Daily  . lidocaine  1 patch Transdermal  Daily  . pantoprazole  40 mg Oral Daily  . pregabalin  75 mg Oral Daily  . rosuvastatin  20 mg Oral Daily  . senna  1 tablet Oral Daily  . sodium chloride flush  3 mL Intravenous Q12H  . sodium chloride flush  3 mL Intravenous Q12H  . tamsulosin  0.4 mg Oral Daily  . cyanocobalamin  500 mcg Oral Daily   Continuous Infusions: . sodium chloride       LOS: 16 days    Time spent: 35 minutes.  Elmarie Shiley, MD [facility]   If 7PM-7AM, please contact night-coverage [email or web address]  04/05/2020, 12:18 PM

## 2020-04-05 NOTE — Anesthesia Procedure Notes (Signed)
Procedure Name: MAC Date/Time: 04/05/2020 10:12 AM Performed by: Trinna Post., CRNA Pre-anesthesia Checklist: Patient identified, Emergency Drugs available, Suction available, Patient being monitored and Timeout performed Patient Re-evaluated:Patient Re-evaluated prior to induction Oxygen Delivery Method: Nasal cannula Preoxygenation: Pre-oxygenation with 100% oxygen Induction Type: IV induction Placement Confirmation: positive ETCO2

## 2020-04-05 NOTE — Op Note (Signed)
[facility] Patient Name: Cory Weber Procedure Date : 04/05/2020 MRN: [id number] Attending MD: Gerrit Heck , MD Date of Birth: 1950/06/19 CSN: [id number] Age: 70 Admit Type: Outpatient Procedure:                Upper GI endoscopy and Video Capsule placement Indications:              Acute post hemorrhagic anemia, Heme positive stool Providers:                Gerrit Heck, MD, Josie Dixon, RN, Laverda Sorenson, Technician, Dewitt Hoes, CRNA Referring MD:              Medicines:                Monitored Anesthesia Care Complications:            No immediate complications. Estimated Blood Loss:     Estimated blood loss: none. Procedure:                Pre-Anesthesia Assessment:                           - Prior to the procedure, a History and Physical                            was performed, and patient medications and                            allergies were reviewed. The patient's tolerance of                            previous anesthesia was also reviewed. The risks                            and benefits of the procedure and the sedation                            options and risks were discussed with the patient.                            All questions were answered, and informed consent                            was obtained. Prior Anticoagulants: The patient has                            taken no previous anticoagulant or antiplatelet                            agents except for aspirin. ASA Grade Assessment: IV                            - A patient with severe systemic disease that is a  constant threat to life. After reviewing the risks                            and benefits, the patient was deemed in                            satisfactory condition to undergo the procedure.                           After obtaining informed consent, the endoscope was                            passed under  direct vision. Throughout the                            procedure, the patient's blood pressure, pulse, and                            oxygen saturations were monitored continuously. The                            [id number]-[id number] ([id number]) Olympus gastroscope was                            introduced through the mouth and advanced to the                            third part of duodenum. After obtaining informed                            consent, the endoscope was passed under direct                            vision. Throughout the procedure, the patient's                            blood pressure, pulse, and oxygen saturations were                            monitored continuously.The upper GI endoscopy was                            accomplished without difficulty. The patient                            tolerated the procedure well. Scope In: Scope Out: Findings:      Localized, white plaques were found in the upper third of the esophagus.       This appeared suspicious for Esophageal Candidiasis. Given the plan for       VCE placement, biopsies were not obtained so not to confound VCE       findings and will instead treat with antifungal therapy as below.      The middle third of the esophagus, lower third of the esophagus and  gastroesophageal junction were normal.      Patchy mild inflammation characterized by erythema was found in the       gastric fundus and in the gastric antrum. This was overall improved from       the images on the prior study. This was previously biopsied and not       re-biopsied today.      A small non-bleeding diverticulum was found in the gastric fundus.      The duodenal bulb, first portion of the duodenum, second portion of the       duodenum and third portion of the duodenum were normal. Using the       endoscope, the video capsule enteroscope was advanced into the duodenal       bulb and deployed without issue. Impression:               - Esophageal  plaques were found, suspicious for                            candidiasis.                           - Normal middle third of esophagus, lower third of                            esophagus and gastroesophageal junction.                           - Gastritis.                           - Gastric diverticulum.                           - Normal duodenal bulb, first portion of the                            duodenum, second portion of the duodenum and third                            portion of the duodenum.                           - Successful completion of the Video Capsule                            Enteroscope placement.                           - No specimens collected. Recommendation:           - Return patient to hospital ward for ongoing care.                           - NPO for 2 hours, then start clear liquids x2                            hours and slowly advance diet per VCE placement  protocol.                           - Continue present medications.                           - Diflucan (fluconazole) 200 mg today then 100 mg                            PO daily for 3 weeks for Esophageal Candidiasis.                           - Will follow-up on VCE results tomorrow.                           - Continue serial Hgb/Hct checks.                           - I updated the patient's daughter by phone per                            patient request. Procedure Code(s):        --- Professional ---                           [id number], Esophagogastroduodenoscopy, flexible,                            transoral; diagnostic, including collection of                            specimen(s) by brushing or washing, when performed                            (separate procedure) Diagnosis Code(s):        --- Professional ---                           K22.9, Disease of esophagus, unspecified                           K29.70, Gastritis, unspecified, without bleeding                            K31.4, Gastric diverticulum                           D62, Acute posthemorrhagic anemia                           R19.5, Other fecal abnormalities CPT copyright 2019 [employer]. All rights reserved. The codes documented in this report are preliminary and upon coder review may  be revised to meet current compliance requirements. Gerrit Heck, MD 04/05/2020 11:11:33 AM Number of Addenda: 0

## 2020-04-05 NOTE — Op Note (Signed)
[facility] Patient Name: Cory Weber Procedure Date : 04/05/2020 MRN: [id number] Attending MD: Gerrit Heck , MD Date of Birth: 05/30/1950 CSN: [id number] Age: 70 Admit Type: Inpatient Procedure:                Colonoscopy Indications:              Heme positive stool, Acute post hemorrhagic anemia Providers:                Gerrit Heck, MD, Josie Dixon, RN, Laverda Sorenson, Technician, Dewitt Hoes, CRNA Referring MD:              Medicines:                Monitored Anesthesia Care Complications:            No immediate complications. Estimated Blood Loss:     Estimated blood loss: none. Procedure:                Pre-Anesthesia Assessment:                           - Prior to the procedure, a History and Physical                            was performed, and patient medications and                            allergies were reviewed. The patient's tolerance of                            previous anesthesia was also reviewed. The risks                            and benefits of the procedure and the sedation                            options and risks were discussed with the patient.                            All questions were answered, and informed consent                            was obtained. Prior Anticoagulants: The patient has                            taken no previous anticoagulant or antiplatelet                            agents except for aspirin. ASA Grade Assessment: IV                            - A patient with severe systemic disease that is a  constant threat to life. After reviewing the risks                            and benefits, the patient was deemed in                            satisfactory condition to undergo the procedure.                           After obtaining informed consent, the colonoscope                            was passed under direct vision. Throughout the                             procedure, the patient's blood pressure, pulse, and                            oxygen saturations were monitored continuously. The                            [id number]-HQ190L ([id number]) Olympus colonoscope was                            introduced through the anus and advanced to the the                            cecum, identified by appendiceal orifice and                            ileocecal valve. The colonoscopy was performed                            without difficulty. The patient tolerated the                            procedure well. The quality of the bowel                            preparation was good. The ileocecal valve,                            appendiceal orifice, and rectum were photographed. Scope In: 10:24:46 AM Scope Out: 10:38:31 AM Scope Withdrawal Time: 0 hours 11 minutes 22 seconds  Total Procedure Duration: 0 hours 13 minutes 45 seconds  Findings:      The perianal and digital rectal examinations were normal.      The colon appeared normal throughout. There was no blood throughout the       GI lumen and no areas of recent bleeding or high grade stigmata of       bleeding. The mucosa was normal appearing without ulcers, erosions, or       erythema.      Non-bleeding internal hemorrhoids were found during retroflexion. The       hemorrhoids were small and Grade  I (internal hemorrhoids that do not       prolapse). Impression:               - The entire examined colon is normal.                           - Non-bleeding internal hemorrhoids.                           - No specimens collected. Recommendation:           - Perform an upper GI endoscopy and Video Capsule                            Endoscopy today.                           - Additional recommendations pending Upper                            Endoscopy. Procedure Code(s):        --- Professional ---                           [phone], Colonoscopy, flexible; diagnostic, including                             collection of specimen(s) by brushing or washing,                            when performed (separate procedure) Diagnosis Code(s):        --- Professional ---                           K64.0, First degree hemorrhoids                           R19.5, Other fecal abnormalities                           D62, Acute posthemorrhagic anemia CPT copyright 2019 American Medical Association. All rights reserved. The codes documented in this report are preliminary and upon coder review may  be revised to meet current compliance requirements. Gerrit Heck, MD 04/05/2020 11:03:38 AM Number of Addenda: 0

## 2020-04-05 NOTE — Plan of Care (Signed)
  Problem: Education: Goal: Knowledge of General Education information will improve Description Including pain rating scale, medication(s)/side effects and non-pharmacologic comfort measures Outcome: Progressing   

## 2020-04-05 NOTE — Anesthesia Postprocedure Evaluation (Signed)
Anesthesia Post Note  Patient: Cory Weber  Procedure(s) Performed: ENTEROSCOPY (Left ) COLONOSCOPY WITH PROPOFOL (N/A ) GIVENS CAPSULE STUDY (N/A )     Patient location during evaluation: PACU Anesthesia Type: MAC Level of consciousness: awake and alert Pain management: pain level controlled Vital Signs Assessment: post-procedure vital signs reviewed and stable Respiratory status: spontaneous breathing, nonlabored ventilation and respiratory function stable Cardiovascular status: blood pressure returned to baseline and stable Postop Assessment: no apparent nausea or vomiting Anesthetic complications: no   No complications documented.  Last Vitals:  Vitals:   04/05/20 1110 04/05/20 1120  BP: (!) 185/144 (!) 151/81  Pulse: 89 85  Resp: 19 16  Temp:    SpO2: 91% 98%    Last Pain:  Vitals:   04/05/20 1120  TempSrc:   PainSc: 0-No pain                 Pervis Hocking

## 2020-04-05 NOTE — Interval H&P Note (Signed)
History and Physical Interval Note:  04/05/2020 10:09 AM  Cory Weber  has presented today for surgery, with the diagnosis of Anemia.  The various methods of treatment have been discussed with the patient and family. After consideration of risks, benefits and other options for treatment, the patient has consented to  Procedure(s): ENTEROSCOPY (Left) COLONOSCOPY WITH PROPOFOL (N/A) GIVENS CAPSULE STUDY (N/A) as a surgical intervention.  The patient's history has been reviewed, patient examined, no change in status, stable for surgery.  I have reviewed the patient's chart and labs.  Questions were answered to the patient's satisfaction.     Dominic Pea Cory Weber

## 2020-04-06 DIAGNOSIS — K5520 Angiodysplasia of colon without hemorrhage: Secondary | ICD-10-CM

## 2020-04-06 LAB — BASIC METABOLIC PANEL
Anion gap: 6 (ref 5–15)
BUN: 20 mg/dL (ref 8–23)
CO2: 30 mmol/L (ref 22–32)
Calcium: 8.7 mg/dL — ABNORMAL LOW (ref 8.9–10.3)
Chloride: 96 mmol/L — ABNORMAL LOW (ref 98–111)
Creatinine, Ser: 1.62 mg/dL — ABNORMAL HIGH (ref 0.61–1.24)
GFR, Estimated: 46 mL/min — ABNORMAL LOW (ref >60)
Glucose, Bld: 151 mg/dL — ABNORMAL HIGH (ref 70–99)
Potassium: 3.9 mmol/L (ref 3.5–5.1)
Sodium: 132 mmol/L — ABNORMAL LOW (ref 135–145)

## 2020-04-06 LAB — GLUCOSE, CAPILLARY
Glucose-Capillary: 133 mg/dL — ABNORMAL HIGH (ref 70–99)
Glucose-Capillary: 187 mg/dL — ABNORMAL HIGH (ref 70–99)
Glucose-Capillary: 193 mg/dL — ABNORMAL HIGH (ref 70–99)
Glucose-Capillary: 234 mg/dL — ABNORMAL HIGH (ref 70–99)

## 2020-04-06 LAB — CBC
HCT: 23 % — ABNORMAL LOW (ref 39.0–52.0)
Hemoglobin: 7.2 g/dL — ABNORMAL LOW (ref 13.0–17.0)
MCH: 29.6 pg (ref 26.0–34.0)
MCHC: 31.3 g/dL (ref 30.0–36.0)
MCV: 94.7 fL (ref 80.0–100.0)
Platelets: 289 10*3/uL (ref 150–400)
RBC: 2.43 MIL/uL — ABNORMAL LOW (ref 4.22–5.81)
RDW: 15 % (ref 11.5–15.5)
WBC: 7.5 10*3/uL (ref 4.0–10.5)
nRBC: 0.4 % — ABNORMAL HIGH (ref 0.0–0.2)

## 2020-04-06 LAB — PREPARE RBC (CROSSMATCH)

## 2020-04-06 MED ORDER — METOCLOPRAMIDE HCL 5 MG/ML IJ SOLN: 10.0000 mg | Freq: Once | INTRAMUSCULAR | Status: DC

## 2020-04-06 MED ORDER — SODIUM CHLORIDE 0.9% IV SOLUTION: Freq: Once | INTRAVENOUS | Status: AC

## 2020-04-06 MED ORDER — FLUCONAZOLE 200 MG PO TABS
200.0000 mg | ORAL_TABLET | Freq: Every day | ORAL | Status: DC
  Administered 2020-04-06 – 2020-04-08 (×2): 200 mg via ORAL
  Filled 2020-04-06 (×4): qty 1

## 2020-04-06 MED ORDER — FUROSEMIDE 10 MG/ML IJ SOLN
20.0000 mg | Freq: Once | INTRAMUSCULAR | Status: AC
  Administered 2020-04-06: 20 mg via INTRAVENOUS
  Filled 2020-04-06: qty 2

## 2020-04-06 NOTE — Procedures (Addendum)
Video Capsule Endoscopy complete. Images reviewed and notable for the following:  - Complete study with adequate prep - Capsule placed endoscopically into the small bowel and deployed without issue - First duodenal image at 00:02:26 - Non-bleeding AVM at 00:14:46 - Otherwise unremarkable small bowel. No blood in small bowel. - First cecal image at 02:15:54 - Limited views of the colon due to stools/debris. This was otherwise normal appearing on colonoscopy immediately preceding this capsule study.   Summary and Recommendations: -Medium sized non-bleeding AVM in the proximal small bowel. This could certainly serve as the culprit for recent acute on chronic anemia and FOBT+ stools in the setting of anti-platelet therapy.  This lesion is likely within reach of push enteroscopy, and if not, is in reach of balloon enteroscopy. - Continue Plavix hold for now -Continue trending serial Hgb/Hct with blood products per inpatient protocol -NPO at midnight with plan for push enteroscopy tomorrow for therapeutic intent  I discussed these results and recommendations with the patient.  Discussed risks, benefits, alternatives of push enteroscopy along with single balloon enteroscopy.  Plan for transfusion 1 unit PRBCs now for hemoglobin 7.2, and procedure tomorrow morning.  All questions answered.  Per patient request, discussed results with his daughter by phone as well.

## 2020-04-06 NOTE — Plan of Care (Signed)
?  Problem: Elimination: ?Goal: Will not experience complications related to urinary retention ?Outcome: Progressing ?  ?

## 2020-04-06 NOTE — Progress Notes (Signed)
Physical Therapy Treatment Patient Details Name: Cory Weber MRN: [id number] DOB: 20-Jul-1950 Today's Date: 04/06/2020    History of Present Illness Pt adm with acute hypoxic respiratory failure secondary to heart failure exacerbation and NSTEMI. Cardiac cath 2/8 severe complex CAD. Pt not candidate for CABG or PCI. During admission pt noted to have worsening chronic anemia requiring multiple transfusions.  Had colonscopy and upper GI study with video capsule placement on 04/05/20- revealed medium sized non-bleeding AVM in the proximal small bowel with plan for push enteroscopy tomorrow for therapeutic intent.  PMH - bil BKA 2017/2018, ckd, cva, hep C, asthma with 2L O2  baseline.    PT Comments    Pt declined sitting in recliner or at EOB due to sore from sitting in recliner yesterday, not much sleep, and wanting to rest for procedure tomorrow.  Tried to encourage but pt declined today.  He was agreeable to exercises.  Performed LE exercises without difficulty and encouraged to perform on his own.  Attempted UE exercises resistance exercises but unable due to c/o L shoulder pain.  Continue to progress as able.     Follow Up Recommendations  Other (comment) (return to long term care with PT)     Equipment Recommendations  None recommended by PT    Recommendations for Other Services       Precautions / Restrictions Precautions Precautions: Fall Restrictions Other Position/Activity Restrictions: bil BKA hx    Mobility  Bed Mobility Overal bed mobility: Needs Assistance             General bed mobility comments: Pt declining transfer to chair or sitting EOB today due to L UE pain and sore from sitting in chair too long yesterday.  Tried to just encourage EOB for a few mins with therapy but pt declined due to wanting to rest for procedure tomorrow.  He did perform 2 long sits in bed with assist at bil UE.  Agreeable to LE exercises    Transfers                     Ambulation/Gait                 Stairs             Wheelchair Mobility    Modified Rankin (Stroke Patients Only)       Balance                                            Cognition Arousal/Alertness: Awake/alert Behavior During Therapy: WFL for tasks assessed/performed Overall Cognitive Status: Within Functional Limits for tasks assessed                                        Exercises General Exercises - Lower Extremity Quad Sets: AROM;Both;10 reps;Supine Gluteal Sets: AROM;Both;10 reps;Supine Hip ABduction/ADduction: AROM;Both;10 reps;Supine Straight Leg Raises: AROM;10 reps;Both;Supine Other Exercises Other Exercises: Hip ABD pillow squeeze x 10; tried UE exercises but limited due to L shoulder pain    General Comments        Pertinent Vitals/Pain Pain Assessment: Faces Faces Pain Scale: Hurts little more Pain Location: L shoulder Pain Descriptors / Indicators: Discomfort Pain Intervention(s): Monitored during session;Limited activity within patient's tolerance    Home Living  Prior Function            PT Goals (current goals can now be found in the care plan section) Acute Rehab PT Goals Patient Stated Goal: ease L shoulder soreness PT Goal Formulation: With patient Time For Goal Achievement: 04/06/20 Potential to Achieve Goals: Good Progress towards PT goals: Not progressing toward goals - comment (self limiting)    Frequency    Min 2X/week      PT Plan Current plan remains appropriate    Co-evaluation              AM-PAC PT "6 Clicks" Mobility   Outcome Measure  Help needed turning from your back to your side while in a flat bed without using bedrails?: A Little Help needed moving from lying on your back to sitting on the side of a flat bed without using bedrails?: A Little Help needed moving to and from a bed to a chair (including a wheelchair)?: A  Little Help needed standing up from a chair using your arms (e.g., wheelchair or bedside chair)?: Total Help needed to walk in hospital room?: Total Help needed climbing 3-5 steps with a railing? : Total 6 Click Score: 12    End of Session   Activity Tolerance: Other (comment) (self limiting) Patient left: in bed;with call bell/phone within reach;with bed alarm set Nurse Communication: Mobility status PT Visit Diagnosis: Other abnormalities of gait and mobility (R26.89);Muscle weakness (generalized) (M62.81)     Time: 5400-8676 PT Time Calculation (min) (ACUTE ONLY): 14 min  Charges:  $Therapeutic Exercise: 8-22 mins                     Abran Richard, PT Acute Rehab Services Pager [phone] [facility] [place] 04/06/2020, 4:05 PM

## 2020-04-06 NOTE — Progress Notes (Signed)
PROGRESS NOTE    Cory Weber  ZGY:[id number] DOB: [birth year] DOA: 03/20/2020 PCP: Iona Beard, MD   Brief Narrative: 70 year old with past medical history significant for CKD, CVA, hepatitis C, bilateral BKA, hyperlipidemia, BPH, anemia, asthma who presents complaining of acute onset shortness of breath, he is on chronically 2 L of oxygen at baseline, was hypoxic with sats in the 70s upon arrival to the ED. -Patient with a history of intermittent black stool few weeks prior to admission. -Admitted with acute hypoxic respiratory failure secondary to heart failure exacerbation, non-STEMI and anemia. -Patient was transferred from [facility] to [facility] for cardiac catheterization. -Cardiac cath performed 12/8 show: Severe complex CAD, medical management recommended, diuresis with IV Lasix and then transition to oral diuretics. -Patient was also noted to have worsening of chronic anemia, he has received 3 units of packed red blood cells this admission.   Assessment & Plan:   Principal Problem:   NSTEMI (non-ST elevated myocardial infarction) ([place]) Active Problems:   Polysubstance abuse ([place])   Tobacco abuse   Melena   Chronic kidney disease, stage 3 (HCC)   Essential hypertension   Chronic hepatitis C without hepatic coma (HCC)   History of CVA (cerebrovascular accident) without residual deficits   Phantom limb pain (HCC)   Anemia of chronic disease   PVD (peripheral vascular disease) (HCC)   Amputation of left lower extremity below knee (HCC)   GERD (gastroesophageal reflux disease)   Edema of right lower extremity   Uncontrolled type II diabetes with peripheral autonomic neuropathy (HCC)   Dyslipidemia associated with type 2 diabetes mellitus (HCC)   Physical deconditioning   Anemia, chronic disease   Chronic diastolic CHF (congestive heart failure) (HCC)   PAD (peripheral artery disease) ([place])   DNR (do not resuscitate)   Heart failure ([place])   Occult blood in  stools   Gastritis and gastroduodenitis   Gastric diverticulum   Grade I internal hemorrhoids  1-non-STEMI -Admitted with pulmonary edema, troponin significantly elevated at 12 K, treated with heparin drip and nitrates. -Cardiology consulted, patient underwent cardiac catheterization on 2/8 which show: Severe complex CAD.  There is a tapering 70% stenosis of the distal left main involving as well as the ostium of the LAD and ramus intermediate with hazy appearance.  The LCx is codominant vessel with extensive dissection involving the ostium and proximal vessel and is subtotally occluded. Medical management recommended, and no amenable to PCI, not a good candidate for CABG per cardiology. -Continue with aspirin, Coreg, indoor high-dose. -keep hb above 8.   2-Acute Systolic/Diastolic Heart failure Exacerbation with Acute Hypoxic Respiratory Failure and Pulmonary Edema: -Patient presented with oxygen saturation in the 70s on admission, required nonrebreather mask, secondary to pulmonary edema. -Echo ejection fraction 40 to 45%. -Improved with diuretics, transition to oral Lasix 12/15. -Continue with hydralazine Norvasc and Coreg.  3-Anemia of chronic disease and iron deficiency:  -Hb baseline 9.  -Patient has received 3 units of packed red blood cells this admission.  Heme "positive on admission.  Anemia panel suggestive of chronic disease some mild iron deficiency. -Gastroenterology is consulted, underwent endoscopy 2/18 which noticed some erosive gastropathy and erythematosus mucosa in the fundus, unfortunately patient did not completed his bowel prep.   - He received IV iron.  -GI consulted for inpatient colonoscopy, logistically challenging for outpatient colonoscopy. -Colonoscopy 2/23: Entire colon was normal. -Upper GI endoscopy and video capsule endoscopy 2/23; esophageal plaques, suspicious for candidiasis. Gastritis. Showed AVM small bowel. For  enteroscopy tomorrow for therapeutic  intent.  -started on fluconazole. Needs 3 weeks.  -Plan to transfuse one unit PRBC 2/24  4-Hyperlipidemia: Continue with the statins.  5-history of CVA: Continue with aspirin and statin. Plan to resume Plavix at discharge if hemoglobin is stabilized.  6-GERD: Continue with PPI Therapy see: Needs to follow-up as an outpatient.  7-Diabetes type 2 with nephropathy: Holding Lantus today due to low blood sugar.  Continue with sliding scale insulin  Peripheral vascular disease: Status post left BKA 2017, right BKA 2018  Morbid Obesity BMI 42 needs lifestyle modification.  CKD stage III AAA: Stable around 1.7--- 1.8--1.6  Esophageal candidiasis; Needs 3 weeks of fluconazole.   Estimated body mass index is 41.43 kg/m as calculated from the following:   Height as of this encounter: 5\' 7"  (1.702 m).   Weight as of this encounter: 120 kg.   DVT prophylaxis: Holding heparin subc due to low hb.  Code Status: DNR Family Communication: No family at bedside.  Disposition Plan:  Status is: Inpatient  Remains inpatient appropriate because:IV treatments appropriate due to intensity of illness or inability to take PO   Dispo: The patient is from: SNF              Anticipated d/c is to: SNF              Anticipated d/c date is: 2 days              Patient currently is not medically stable to d/c.   Difficult to place patient No        Consultants:   GI  Cardiology   Procedures:    CATH; Mid LM to Dist LM lesion is 70% stenosed.  Ost LAD to Prox LAD lesion is 70% stenosed.  Ramus lesion is 70% stenosed.  Ost Cx to Mid Cx lesion is 99% stenosed.  Prox RCA lesion is 40% stenosed.  There is moderate left ventricular systolic dysfunction.  LV end diastolic pressure is severely elevated.  The left ventricular ejection fraction is 35-45% by visual estimate.  1. Severe complex CAD. There is a tapering 70% stenosis in the distal Left main involving as well the ostium of  the LAD and ramus intermediate with hazy appearance. The LCx is a co-dominant vessel with extensive dissection involving the ostium and proximal vessel and is is subtotally occluded. 2. Moderate LV dysfunction. EF 40-45% 3. Severely elevated LVEDP 30 mm Hg.     Antimicrobials:    Subjective: He is feeling well. Had an episode of chest pain last night, resolved after tylenol.  Denies chest pain today.   Objective: Vitals:   04/05/20 2020 04/06/20 0030 04/06/20 0434 04/06/20 0435  BP: 121/64 119/75  131/62  Pulse: 90 91    Resp: 19 20  18   Temp: 98.5 F (36.9 C) 98.8 F (37.1 C) 99.1 F (37.3 C) 99.1 F (37.3 C)  TempSrc: Oral Oral Oral Oral  SpO2: 98% 99%  98%  Weight:   120 kg   Height:        Intake/Output Summary (Last 24 hours) at 04/06/2020 1028 Last data filed at 04/06/2020 0725 Gross per 24 hour  Intake 877 ml  Output 1750 ml  Net -873 ml   Filed Weights   04/05/20 0450 04/05/20 0944 04/06/20 0434  Weight: 119.1 kg 119 kg 120 kg    Examination:  General exam: NAD Respiratory system: CTA Cardiovascular system: S 1, S 2 RRR Gastrointestinal system: BS present,  soft, nt Central nervous system: Alert and oriented.  Extremities:  B/L BKA  Data Reviewed: I have personally reviewed following labs and imaging studies  CBC: Recent Labs  Lab 04/03/20 0239 04/03/20 1103 04/04/20 0910 04/05/20 0248 04/06/20 0313  WBC 6.5 6.8 6.2 6.7 7.5  HGB 7.5* 7.5* 7.6* 8.1* 7.2*  HCT 22.9* 23.7* 24.3* 26.3* 23.0*  MCV 92.0 93.7 94.2 97.4 94.7  PLT 262 277 280 314 147   Basic Metabolic Panel: Recent Labs  Lab 04/02/20 0720 04/03/20 0239 04/04/20 0910 04/05/20 0248 04/06/20 0313  NA 134* 133* 134* 136 132*  K 4.3 4.3 4.3 4.2 3.9  CL 94* 95* 96* 97* 96*  CO2 33* 33* 31 30 30   GLUCOSE 233* 112* 158* 47* 151*  BUN 32* 33* 24* 21 20  CREATININE 1.70* 1.86* 1.54* 1.50* 1.62*  CALCIUM 8.8* 8.7* 8.9 8.9 8.7*   GFR: Estimated Creatinine Clearance: 54.9 mL/min  (A) (by C-G formula based on SCr of 1.62 mg/dL (H)). Liver Function Tests: No results for input(s): AST, ALT, ALKPHOS, BILITOT, PROT, ALBUMIN in the last 168 hours. No results for input(s): LIPASE, AMYLASE in the last 168 hours. No results for input(s): AMMONIA in the last 168 hours. Coagulation Profile: No results for input(s): INR, PROTIME in the last 168 hours. Cardiac Enzymes: No results for input(s): CKTOTAL, CKMB, CKMBINDEX, TROPONINI in the last 168 hours. BNP (last 3 results) No results for input(s): PROBNP in the last 8760 hours. HbA1C: No results for input(s): HGBA1C in the last 72 hours. CBG: Recent Labs  Lab 04/05/20 1212 04/05/20 1254 04/05/20 1611 04/05/20 2140 04/06/20 0548  GLUCAP 69* 111* 154* 180* 133*   Lipid Profile: No results for input(s): CHOL, HDL, LDLCALC, TRIG, CHOLHDL, LDLDIRECT in the last 72 hours. Thyroid Function Tests: No results for input(s): TSH, T4TOTAL, FREET4, T3FREE, THYROIDAB in the last 72 hours. Anemia Panel: No results for input(s): VITAMINB12, FOLATE, FERRITIN, TIBC, IRON, RETICCTPCT in the last 72 hours. Sepsis Labs: No results for input(s): PROCALCITON, LATICACIDVEN in the last 168 hours.  No results found for this or any previous visit (from the past 240 hour(s)).       Radiology Studies: No results found.      Scheduled Meds: . sodium chloride   Intravenous Once  . amLODipine  10 mg Oral Daily  . vitamin C  500 mg Oral BID  . aspirin EC  325 mg Oral Daily  . carvedilol  25 mg Oral BID WC  . DULoxetine  60 mg Oral Daily  . fluconazole  200 mg Oral Daily  . furosemide  20 mg Intravenous Once  . furosemide  80 mg Oral Daily  . hydrALAZINE  50 mg Oral Q8H  . insulin aspart  0-15 Units Subcutaneous TID WC  . insulin aspart  0-5 Units Subcutaneous QHS  . insulin aspart  4 Units Subcutaneous TID AC  . isosorbide mononitrate  120 mg Oral Daily  . lidocaine  1 patch Transdermal Daily  . pantoprazole  40 mg Oral Daily   . pregabalin  75 mg Oral Daily  . rosuvastatin  20 mg Oral Daily  . senna  1 tablet Oral Daily  . sodium chloride flush  3 mL Intravenous Q12H  . sodium chloride flush  3 mL Intravenous Q12H  . tamsulosin  0.4 mg Oral Daily  . cyanocobalamin  500 mcg Oral Daily   Continuous Infusions: . sodium chloride       LOS: 17 days  Time spent: 35 minutes.     Elmarie Shiley, MD [facility]   If 7PM-7AM, please contact night-coverage [email or web address]  04/06/2020, 10:28 AM

## 2020-04-06 NOTE — Progress Notes (Signed)
Pt blood transfusion has begun. With no signs and symptoms of reaction.

## 2020-04-06 NOTE — Progress Notes (Signed)
Pt has been started on oral fluconazole for esophageal candidiasis. D/w Dr Tyrell Antonio and we will adjust dose to 200mg  PO qday d/t indication, wt, renal function.  Onnie Boer, PharmD, BCIDP, AAHIVP, CPP Infectious Disease Pharmacist 04/06/2020 7:30 AM

## 2020-04-07 ENCOUNTER — Encounter (HOSPITAL_COMMUNITY): Payer: Self-pay | Admitting: Family Medicine

## 2020-04-07 ENCOUNTER — Inpatient Hospital Stay (HOSPITAL_COMMUNITY): Payer: Medicare (Managed Care) | Admitting: Anesthesiology

## 2020-04-07 ENCOUNTER — Encounter (HOSPITAL_COMMUNITY)
Admission: EM | Disposition: A | Discharge status: Skilled Nursing Facility | Payer: Self-pay | Source: Home / Self Care | Attending: Internal Medicine

## 2020-04-07 DIAGNOSIS — K31819 Angiodysplasia of stomach and duodenum without bleeding: Secondary | ICD-10-CM

## 2020-04-07 DIAGNOSIS — K5520 Angiodysplasia of colon without hemorrhage: Secondary | ICD-10-CM

## 2020-04-07 HISTORY — PX: HOT HEMOSTASIS: SHX5433

## 2020-04-07 HISTORY — PX: ENTEROSCOPY: SHX5533

## 2020-04-07 HISTORY — PX: SUBMUCOSAL TATTOO INJECTION: SHX6856

## 2020-04-07 LAB — GLUCOSE, CAPILLARY
Glucose-Capillary: 180 mg/dL — ABNORMAL HIGH (ref 70–99)
Glucose-Capillary: 156 mg/dL — ABNORMAL HIGH (ref 70–99)
Glucose-Capillary: 132 mg/dL — ABNORMAL HIGH (ref 70–99)
Glucose-Capillary: 199 mg/dL — ABNORMAL HIGH (ref 70–99)
Glucose-Capillary: 165 mg/dL — ABNORMAL HIGH (ref 70–99)
Glucose-Capillary: 256 mg/dL — ABNORMAL HIGH (ref 70–99)

## 2020-04-07 LAB — TYPE AND SCREEN
ABO/RH(D): O POS
Antibody Screen: NEGATIVE
Unit division: 0

## 2020-04-07 LAB — BPAM RBC
Blood Product Expiration Date: 202203242359
ISSUE DATE / TIME: 202202241304
Unit Type and Rh: 5100

## 2020-04-07 LAB — CBC
HCT: 24 % — ABNORMAL LOW (ref 39.0–52.0)
Hemoglobin: 7.9 g/dL — ABNORMAL LOW (ref 13.0–17.0)
MCH: 30 pg (ref 26.0–34.0)
MCHC: 32.9 g/dL (ref 30.0–36.0)
MCV: 91.3 fL (ref 80.0–100.0)
Platelets: 298 10*3/uL (ref 150–400)
RBC: 2.63 MIL/uL — ABNORMAL LOW (ref 4.22–5.81)
RDW: 16.2 % — ABNORMAL HIGH (ref 11.5–15.5)
WBC: 7.2 10*3/uL (ref 4.0–10.5)
nRBC: 0.3 % — ABNORMAL HIGH (ref 0.0–0.2)

## 2020-04-07 SURGERY — ENTEROSCOPY
Anesthesia: Monitor Anesthesia Care

## 2020-04-07 MED ORDER — PROPOFOL 500 MG/50ML IV EMUL
INTRAVENOUS | Status: DC | PRN
  Administered 2020-04-07: 100 ug/kg/min via INTRAVENOUS

## 2020-04-07 MED ORDER — SPOT INK MARKER SYRINGE KIT
PACK | SUBMUCOSAL | Status: AC
  Filled 2020-04-07: qty 10

## 2020-04-07 MED ORDER — GLYCOPYRROLATE 0.2 MG/ML IJ SOLN
INTRAMUSCULAR | Status: DC | PRN
  Administered 2020-04-07 (×2): .2 mg via INTRAVENOUS

## 2020-04-07 MED ORDER — PROPOFOL 10 MG/ML IV BOLUS
INTRAVENOUS | Status: DC | PRN
  Administered 2020-04-07: 30 mg via INTRAVENOUS

## 2020-04-07 MED ORDER — SPOT INK MARKER SYRINGE KIT
PACK | SUBMUCOSAL | Status: DC | PRN
  Administered 2020-04-07: 4 mL via SUBMUCOSAL

## 2020-04-07 MED ORDER — LACTATED RINGERS IV SOLN
INTRAVENOUS | Status: AC | PRN
  Administered 2020-04-07: 1000 mL via INTRAVENOUS

## 2020-04-07 NOTE — Progress Notes (Signed)
PROGRESS NOTE    Cory Weber  HUD:[id number] DOB: 28-Aug-1950 DOA: 03/20/2020 PCP: Iona Beard, MD   Brief Narrative: 70 year old with past medical history significant for CKD, CVA, hepatitis C, bilateral BKA, hyperlipidemia, BPH, anemia, asthma who presents complaining of acute onset shortness of breath, he is on chronically 2 L of oxygen at baseline, was hypoxic with sats in the 70s upon arrival to the ED. -Patient with a history of intermittent black stool few weeks prior to admission. -Admitted with acute hypoxic respiratory failure secondary to heart failure exacerbation, non-STEMI and anemia. -Patient was transferred from [facility] to [facility] for cardiac catheterization. -Cardiac cath performed 12/8 show: Severe complex CAD, medical management recommended, diuresis with IV Lasix and then transition to oral diuretics. -Patient was also noted to have worsening of chronic anemia, he has received 3 units of packed red blood cells this admission.   Assessment & Plan:   Principal Problem:   NSTEMI (non-ST elevated myocardial infarction) ([place]) Active Problems:   Polysubstance abuse ([place])   Tobacco abuse   Melena   Acute blood loss anemia   Chronic kidney disease, stage 3 (HCC)   Essential hypertension   Chronic hepatitis C without hepatic coma (HCC)   History of CVA (cerebrovascular accident) without residual deficits   Phantom limb pain (HCC)   Anemia of chronic disease   PVD (peripheral vascular disease) (HCC)   Amputation of left lower extremity below knee (HCC)   GERD (gastroesophageal reflux disease)   Edema of right lower extremity   Uncontrolled type II diabetes with peripheral autonomic neuropathy (HCC)   Dyslipidemia associated with type 2 diabetes mellitus (HCC)   Physical deconditioning   Anemia, chronic disease   Chronic diastolic CHF (congestive heart failure) (HCC)   PAD (peripheral artery disease) ([place])   DNR (do not resuscitate)   Heart failure  ([place])   Occult blood in stools   Gastritis and gastroduodenitis   Gastric diverticulum   Grade I internal hemorrhoids   AVM (arteriovenous malformation) of small bowel, acquired  1-non-STEMI -Admitted with pulmonary edema, troponin significantly elevated at 12 K, treated with heparin drip and nitrates. -Cardiology consulted, patient underwent cardiac catheterization on 2/8 which show: Severe complex CAD.  There is a tapering 70% stenosis of the distal left main involving as well as the ostium of the LAD and ramus intermediate with hazy appearance.  The LCx is codominant vessel with extensive dissection involving the ostium and proximal vessel and is subtotally occluded. Medical management recommended, and no amenable to PCI, not a good candidate for CABG per cardiology. -Continue with aspirin, Coreg, indoor high-dose. -keep hb above 8. Hb today 7.9, repeat labs in am.   2-Acute Systolic/Diastolic Heart failure Exacerbation with Acute Hypoxic Respiratory Failure and Pulmonary Edema: -Patient presented with oxygen saturation in the 70s on admission, required nonrebreather mask, secondary to pulmonary edema. -Echo ejection fraction 40 to 45%. -Improved with diuretics, transition to oral Lasix 12/15. -Continue with hydralazine Norvasc and Coreg.  3-Anemia of chronic disease and iron deficiency:  -Hb baseline 9.  -Patient has received 4 units of packed red blood cells this admission.  Heme "positive on admission.  Anemia panel suggestive of chronic disease some mild iron deficiency. -Gastroenterology is consulted, underwent endoscopy 2/18 which noticed some erosive gastropathy and erythematosus mucosa in the fundus, unfortunately patient did not completed his bowel prep.   - He received IV iron.  -GI consulted for inpatient colonoscopy, logistically challenging for outpatient colonoscopy. -Colonoscopy 2/23: Entire  colon was normal. -Upper GI endoscopy and video capsule endoscopy 2/23;  esophageal plaques, suspicious for candidiasis. Gastritis. Showed AVM small bowel. Underwent enteroscopy 2/25 and 3 AVM  treated with argon coagulation.  -started on fluconazole. Needs 3 weeks.  -received one unit PRBC 2/24. Hb 7.9 today. Repeat labs tomorrow.   4-Hyperlipidemia: Continue with the statins.  5-history of CVA: Continue with aspirin and statin. Plan to resume Plavix at discharge if hemoglobin is stabilized.  6-GERD: Continue with PPI Therapy see: Needs to follow-up as an outpatient.  7-Diabetes type 2 with nephropathy: Holding Lantus today due to low blood sugar.  Continue with sliding scale insulin  Peripheral vascular disease: Status post left BKA 2017, right BKA 2018  Morbid Obesity BMI 42 needs lifestyle modification.  CKD stage III AAA: Stable around 1.7--- 1.8--1.6  Esophageal candidiasis; Needs 3 weeks of fluconazole.   Estimated body mass index is 40.99 kg/m as calculated from the following:   Height as of this encounter: 5\' 7"  (1.702 m).   Weight as of this encounter: 118.7 kg.   DVT prophylaxis: Holding heparin subc due to low hb.  Code Status: DNR Family Communication: No family at bedside.  Disposition Plan:  Status is: Inpatient  Remains inpatient appropriate because:IV treatments appropriate due to intensity of illness or inability to take PO   Dispo: The patient is from: SNF              Anticipated d/c is to: SNF              Anticipated d/c date is: 2 days              Patient currently is not medically stable to d/c.   Difficult to place patient No        Consultants:   GI  Cardiology   Procedures:    CATH; Mid LM to Dist LM lesion is 70% stenosed.  Ost LAD to Prox LAD lesion is 70% stenosed.  Ramus lesion is 70% stenosed.  Ost Cx to Mid Cx lesion is 99% stenosed.  Prox RCA lesion is 40% stenosed.  There is moderate left ventricular systolic dysfunction.  LV end diastolic pressure is severely elevated.  The left  ventricular ejection fraction is 35-45% by visual estimate.  1. Severe complex CAD. There is a tapering 70% stenosis in the distal Left main involving as well the ostium of the LAD and ramus intermediate with hazy appearance. The LCx is a co-dominant vessel with extensive dissection involving the ostium and proximal vessel and is is subtotally occluded. 2. Moderate LV dysfunction. EF 40-45% 3. Severely elevated LVEDP 30 mm Hg.     Antimicrobials:    Subjective: Denies chest pain. Feels ok, would like to eat   Objective: Vitals:   04/07/20 0933 04/07/20 0943 04/07/20 0954 04/07/20 1145  BP: (!) 143/79 (!) 151/66 139/72 (!) 124/54  Pulse: (!) 106 99 98 93  Resp: 18 17 19 20   Temp: 98.6 F (37 C)   98.3 F (36.8 C)  TempSrc: Oral   Oral  SpO2: 100% 100% 100% 93%  Weight:      Height:        Intake/Output Summary (Last 24 hours) at 04/07/2020 1443 Last data filed at 04/07/2020 1256 Gross per 24 hour  Intake 998.83 ml  Output 1200 ml  Net -201.17 ml   Filed Weights   04/05/20 0944 04/06/20 0434 04/07/20 0234  Weight: 119 kg 120 kg 118.7 kg    Examination:  General exam: NAD Respiratory system: CTA Cardiovascular system: S 1, S 2 RRR Gastrointestinal system: BS present, soft, nt Central nervous system: alert Extremities:  B/L BKA  Data Reviewed: I have personally reviewed following labs and imaging studies  CBC: Recent Labs  Lab 04/03/20 1103 04/04/20 0910 04/05/20 0248 04/06/20 0313 04/07/20 0612  WBC 6.8 6.2 6.7 7.5 7.2  HGB 7.5* 7.6* 8.1* 7.2* 7.9*  HCT 23.7* 24.3* 26.3* 23.0* 24.0*  MCV 93.7 94.2 97.4 94.7 91.3  PLT 277 280 314 289 825   Basic Metabolic Panel: Recent Labs  Lab 04/02/20 0720 04/03/20 0239 04/04/20 0910 04/05/20 0248 04/06/20 0313  NA 134* 133* 134* 136 132*  K 4.3 4.3 4.3 4.2 3.9  CL 94* 95* 96* 97* 96*  CO2 33* 33* 31 30 30   GLUCOSE 233* 112* 158* 47* 151*  BUN 32* 33* 24* 21 20  CREATININE 1.70* 1.86* 1.54* 1.50* 1.62*   CALCIUM 8.8* 8.7* 8.9 8.9 8.7*   GFR: Estimated Creatinine Clearance: 54.5 mL/min (A) (by C-G formula based on SCr of 1.62 mg/dL (H)). Liver Function Tests: No results for input(s): AST, ALT, ALKPHOS, BILITOT, PROT, ALBUMIN in the last 168 hours. No results for input(s): LIPASE, AMYLASE in the last 168 hours. No results for input(s): AMMONIA in the last 168 hours. Coagulation Profile: No results for input(s): INR, PROTIME in the last 168 hours. Cardiac Enzymes: No results for input(s): CKTOTAL, CKMB, CKMBINDEX, TROPONINI in the last 168 hours. BNP (last 3 results) No results for input(s): PROBNP in the last 8760 hours. HbA1C: No results for input(s): HGBA1C in the last 72 hours. CBG: Recent Labs  Lab 04/06/20 2150 04/07/20 0350 04/07/20 0632 04/07/20 0938 04/07/20 1144  GLUCAP 234* 180* 156* 132* 199*   Lipid Profile: No results for input(s): CHOL, HDL, LDLCALC, TRIG, CHOLHDL, LDLDIRECT in the last 72 hours. Thyroid Function Tests: No results for input(s): TSH, T4TOTAL, FREET4, T3FREE, THYROIDAB in the last 72 hours. Anemia Panel: No results for input(s): VITAMINB12, FOLATE, FERRITIN, TIBC, IRON, RETICCTPCT in the last 72 hours. Sepsis Labs: No results for input(s): PROCALCITON, LATICACIDVEN in the last 168 hours.  No results found for this or any previous visit (from the past 240 hour(s)).       Radiology Studies: No results found.      Scheduled Meds: . amLODipine  10 mg Oral Daily  . vitamin C  500 mg Oral BID  . aspirin EC  325 mg Oral Daily  . carvedilol  25 mg Oral BID WC  . DULoxetine  60 mg Oral Daily  . fluconazole  200 mg Oral Daily  . furosemide  80 mg Oral Daily  . hydrALAZINE  50 mg Oral Q8H  . insulin aspart  0-15 Units Subcutaneous TID WC  . insulin aspart  0-5 Units Subcutaneous QHS  . insulin aspart  4 Units Subcutaneous TID AC  . isosorbide mononitrate  120 mg Oral Daily  . lidocaine  1 patch Transdermal Daily  . metoCLOPramide  (REGLAN) injection  10 mg Intravenous Once  . pantoprazole  40 mg Oral Daily  . pregabalin  75 mg Oral Daily  . rosuvastatin  20 mg Oral Daily  . senna  1 tablet Oral Daily  . sodium chloride flush  3 mL Intravenous Q12H  . sodium chloride flush  3 mL Intravenous Q12H  . tamsulosin  0.4 mg Oral Daily  . cyanocobalamin  500 mcg Oral Daily   Continuous Infusions: . sodium chloride  LOS: 18 days    Time spent: 35 minutes.     Elmarie Shiley, MD [facility]   If 7PM-7AM, please contact night-coverage [email or web address]  04/07/2020, 2:43 PM

## 2020-04-07 NOTE — Anesthesia Postprocedure Evaluation (Signed)
Anesthesia Post Note  Patient: Cory Weber  Procedure(s) Performed: ENTEROSCOPY (N/A ) HOT HEMOSTASIS (ARGON PLASMA COAGULATION/BICAP) (N/A ) SUBMUCOSAL TATTOO INJECTION     Patient location during evaluation: PACU Anesthesia Type: MAC Level of consciousness: awake and alert Pain management: pain level controlled Vital Signs Assessment: post-procedure vital signs reviewed and stable Respiratory status: spontaneous breathing, nonlabored ventilation and respiratory function stable Cardiovascular status: blood pressure returned to baseline and stable Postop Assessment: no apparent nausea or vomiting Anesthetic complications: no   No complications documented.  Last Vitals:  Vitals:   04/07/20 0755 04/07/20 0933  BP: (!) 151/68 (!) 143/79  Pulse: 80 (!) 106  Resp: 15 18  Temp: 36.6 C 37 C  SpO2: 100% 100%    Last Pain:  Vitals:   04/07/20 0933  TempSrc: Oral  PainSc: 0-No pain                 Pervis Hocking

## 2020-04-07 NOTE — Anesthesia Procedure Notes (Signed)
Procedure Name: MAC Date/Time: 04/07/2020 8:45 AM Performed by: Lowella Dell, CRNA Pre-anesthesia Checklist: Patient identified, Emergency Drugs available, Suction available, Patient being monitored and Timeout performed Patient Re-evaluated:Patient Re-evaluated prior to induction Oxygen Delivery Method: Nasal cannula Induction Type: IV induction Placement Confirmation: positive ETCO2 Dental Injury: Teeth and Oropharynx as per pre-operative assessment

## 2020-04-07 NOTE — Progress Notes (Signed)
Patient is alert and oriented and asked about an regular diet. Paged PA for Dr. Bryan Lemma to clarify advancing diet options.

## 2020-04-07 NOTE — Progress Notes (Signed)
Occupational Therapy Treatment Patient Details Name: Cory Weber MRN: [id number] DOB: [birth year] Today's Date: 04/07/2020    History of present illness Pt adm with acute hypoxic respiratory failure secondary to heart failure exacerbation and NSTEMI. Cardiac cath 2/8 severe complex CAD. Pt not candidate for CABG or PCI. During admission pt noted to have worsening chronic anemia requiring multiple transfusions.  Had colonscopy and upper GI study with video capsule placement on 04/05/20- revealed medium sized non-bleeding AVM in the proximal small bowel with plan for push enteroscopy tomorrow for therapeutic intent.  PMH - bil BKA 2017/2018, ckd, cva, hep C, asthma with 2L O2 [place] baseline.   OT comments  Plan of care reviewed with the patient this date.  Stated OT goals discussed.  Patient is from a facility prior and has assist with back and peri care at [facility].  He continues to want to return to the SNF, OT recommends a re-eval for changes once readmitted for any status changes and for care planning purposes.  The majority of the goals were left at there current levels.  Patient decline out of bed due to lethargy status post procedure this am, but patient understands he needs to continue ADL attempts at bed and sitting level.  He also expresses understanding of out of bed and to his recliner.  Patient's B prosthesis at his facility.  Acute OT to continue and maximize functional status.     Follow Up Recommendations  Other (comment) (OT eval upon re-admit to SNF)    Equipment Recommendations  None recommended by OT    Recommendations for Other Services      Precautions / Restrictions Precautions Precautions: Fall Restrictions Other Position/Activity Restrictions: bil BKA hx.  Prothesis are at SNF       Mobility Bed Mobility Overal bed mobility: Needs Assistance Bed Mobility: Rolling Rolling: Min assist   Supine to sit: Min assist Sit to supine: Min assist        Transfers                       Balance Overall balance assessment: Needs assistance Sitting-balance support: No upper extremity supported;Feet unsupported Sitting balance-Leahy Scale: Fair                                     ADL either performed or assessed with clinical judgement   ADL Overall ADL's : Needs assistance/impaired Eating/Feeding: Set up;Sitting   Grooming: Set up;Sitting       Lower Body Bathing: Maximal assistance;Bed level Lower Body Bathing Details (indicate cue type and reason): patient has assist with back and bottom at SNF Upper Body Dressing : Moderate assistance;Sitting   Lower Body Dressing: Moderate assistance;Sitting/lateral leans               Functional mobility during ADLs: Minimal assistance;+2 for physical assistance General ADL Comments: assist with pad to slide to [place]       Perception     Praxis      Cognition Arousal/Alertness: Lethargic Behavior During Therapy: [facility] for tasks assessed/performed Overall Cognitive Status: Within Functional Limits for tasks assessed                                 General Comments: patient s/p endoscopy eariler this morning  Exercises     Shoulder Instructions       General Comments      Pertinent Vitals/ Pain       Faces Pain Scale: Hurts a little bit Pain Location: L shoulder Pain Descriptors / Indicators: Aching Pain Intervention(s): Monitored during session                                                          Frequency  Min 2X/week        Progress Toward Goals  OT Goals(current goals can now be found in the care plan section)  Progress towards OT goals: Progressing toward goals  Acute Rehab OT Goals Patient Stated Goal: I need a different room at my place OT Goal Formulation: With patient Time For Goal Achievement: 04/21/20 Potential to Achieve Goals: Good ADL Goals Pt Will Perform Lower Body  Bathing: (P) sitting/lateral leans;with min assist Pt Will Perform Lower Body Dressing: (P) with min guard assist;sitting/lateral leans Pt Will Transfer to Toilet: (P) with min assist;with transfer board Pt Will Perform Toileting - Clothing Manipulation and hygiene: (P) with min guard assist;sitting/lateral leans  Plan Discharge plan remains appropriate;Frequency remains appropriate    Co-evaluation                 AM-PAC OT "6 Clicks" Daily Activity     Outcome Measure   Help from another person eating meals?: None Help from another person taking care of personal grooming?: A Little Help from another person toileting, which includes using toliet, bedpan, or urinal?: A Lot Help from another person bathing (including washing, rinsing, drying)?: A Lot Help from another person to put on and taking off regular upper body clothing?: A Little Help from another person to put on and taking off regular lower body clothing?: A Lot 6 Click Score: 16    End of Session    OT Visit Diagnosis: Other abnormalities of gait and mobility (R26.89);Muscle weakness (generalized) (M62.81);Pain Pain - Right/Left: Left Pain - part of body: Shoulder   Activity Tolerance Patient tolerated treatment well   Patient Left in bed;with bed alarm set;with call bell/phone within reach   Nurse Communication Mobility status;Other (comment)        Time: 0037-0488 OT Time Calculation (min): 15 min  Charges: OT General Charges $OT Visit: 1 Visit OT Treatments $Self Care/Home Management : 8-22 mins  04/07/2020  Rich, OTR/L  Acute Rehabilitation Services  Office:  [phone]    Metta Clines 04/07/2020, 11:51 AM

## 2020-04-07 NOTE — Progress Notes (Signed)
patinet is experiencing throat and chest pain and asked for tylenol and Icy. Pain is subsiding.

## 2020-04-07 NOTE — Transfer of Care (Signed)
Immediate Anesthesia Transfer of Care Note  Patient: Cory Weber  Procedure(s) Performed: ENTEROSCOPY (N/A ) HOT HEMOSTASIS (ARGON PLASMA COAGULATION/BICAP) (N/A ) SUBMUCOSAL TATTOO INJECTION  Patient Location: PACU and Endoscopy Unit  Anesthesia Type:MAC  Level of Consciousness: drowsy and patient cooperative  Airway & Oxygen Therapy: Patient Spontanous Breathing and Patient connected to nasal cannula oxygen  Post-op Assessment: Report given to RN and Post -op Vital signs reviewed and stable  Post vital signs: Reviewed and stable  Last Vitals:  Vitals Value Taken Time  BP    Temp    Pulse 102 04/07/20 0933  Resp 18 04/07/20 0933  SpO2 95 % 04/07/20 0933  Vitals shown include unvalidated device data.  Last Pain:  Vitals:   04/07/20 0755  TempSrc: Temporal  PainSc: 0-No pain      Patients Stated Pain Goal: 2 (52/84/13 2440)  Complications: No complications documented.

## 2020-04-07 NOTE — Op Note (Signed)
[facility] Patient Name: Cory Weber Procedure Date : 04/07/2020 MRN: [id number] Attending MD: Gerrit Heck , MD Date of Birth: [birth year] CSN: [id number] Age: 70 Admit Type: Inpatient Procedure:                Small bowel enteroscopy Indications:              Acute post hemorrhagic anemia, GI bleeding source                            not documented by previous UGI endoscopy x2 or                            colonoscopy. Arteriovenous malformation in the                            small intestine noted on VCE, and presents today                            for deep enteroscopy for therapeutic intent. Providers:                Gerrit Heck, MD, Baird Cancer, RN, Elspeth Cho Tech., Technician, Cira Servant, CRNA Referring MD:              Medicines:                Monitored Anesthesia Care Complications:            No immediate complications. Estimated Blood Loss:     Estimated blood loss was minimal. Procedure:                Pre-Anesthesia Assessment:                           - Prior to the procedure, a History and Physical                            was performed, and patient medications and                            allergies were reviewed. The patient's tolerance of                            previous anesthesia was also reviewed. The risks                            and benefits of the procedure and the sedation                            options and risks were discussed with the patient.                            All questions were answered, and informed consent  was obtained. Prior Anticoagulants: The patient has                            taken no previous anticoagulant or antiplatelet                            agents. ASA Grade Assessment: III - A patient with                            severe systemic disease. After reviewing the risks                            and benefits, the patient was  deemed in                            satisfactory condition to undergo the procedure.                           - Prior to the procedure, a History and Physical                            was performed, and patient medications and                            allergies were reviewed. The patient's tolerance of                            previous anesthesia was also reviewed. The risks                            and benefits of the procedure and the sedation                            options and risks were discussed with the patient.                            All questions were answered, and informed consent                            was obtained. Prior Anticoagulants: The patient has                            taken Plavix (clopidogrel). ASA Grade Assessment:                            IV - A patient with severe systemic disease that is                            a constant threat to life. After reviewing the                            risks and benefits, the patient was deemed in  satisfactory condition to undergo the procedure.                           After obtaining informed consent, the endoscope was                            passed under direct vision. Throughout the                            procedure, the patient's blood pressure, pulse, and                            oxygen saturations were monitored continuously. The                            PCF-H190DL ([id number] Olympus pediatric colonoscope                            was introduced through the mouth and advanced to                            the mid-jejunum. The small bowel enteroscopy was                            accomplished without difficulty. The patient                            tolerated the procedure well. Scope In: Scope Out: Findings:      The examined esophagus was normal.      Localized mild inflammation characterized by erythema was found in the       gastric antrum. This had been  previously biopsied and continues to       improve on serial studies.      Two small angioectasias with no bleeding were found in the fourth       portion of the duodenum. Coagulation for hemostasis using argon plasma       was successful. Estimated blood loss: none.      A single medium-sized angioectasia with no bleeding was found in the       proximal jejunum. This corresponds well to the location and appearance       of the AVM noted on the Video Capsule Endoscopy. Coagulation for       hemostasis using argon plasma was successful. Estimated blood loss: none.      There was a single benign Lymphangiectasia found in the third portion of       the duodenum.      The remainder of the small bowel was normal appearing. Able to advance       the enteroscope approximately 80 cm from the pylorus. The distal extent       reached was tattooed with an injection of 2 mL of Spot (carbon black) as       a way to delineate location and accessibility on future VCE in the event       of rebleeding. Impression:               - Normal esophagus.                           -  Mild antral and fundus gastritis.                           - Two small, non-bleeding angioectasias in the                            duodenum. Treated with argon plasma coagulation                            (APC).                           - A single medium-sized non-bleeding angioectasia                            in the jejunum. Treated with argon plasma                            coagulation (APC).                           - Single benign lymphangiectasia found in the third                            portion of the duodenum.                           - Tattoo placed at the distal extent reached                            (approximately 80 cm from the pylorus) as a way to                            delineate location and accessibility on future VCE                            in the event of rebleeding.                            - No specimens collected. Recommendation:           - Return patient to hospital ward for ongoing care.                           - Continue present medications.                           - Resume previous diet today.                           - Continue serial Hgb/Hct checks.                           - Repeat the small bowel enteroscopy PRN for  retreatment if concern for rebleeding. Procedure Code(s):        --- Professional ---                           [id number], Small intestinal endoscopy, enteroscopy                            beyond second portion of duodenum, not including                            ileum; with control of bleeding (eg, injection,                            bipolar cautery, unipolar cautery, laser, heater                            probe, stapler, plasma coagulator)                           44799, Unlisted procedure, small intestine Diagnosis Code(s):        --- Professional ---                           K29.70, Gastritis, unspecified, without bleeding                           K31.819, Angiodysplasia of stomach and duodenum                            without bleeding                           K55.20, Angiodysplasia of colon without hemorrhage                           K31.89, Other diseases of stomach and duodenum                           D62, Acute posthemorrhagic anemia                           K92.2, Gastrointestinal hemorrhage, unspecified CPT copyright 2019 [employer]. All rights reserved. The codes documented in this report are preliminary and upon coder review may  be revised to meet current compliance requirements. Gerrit Heck, MD 04/07/2020 9:46:16 AM Number of Addenda: 0

## 2020-04-07 NOTE — Interval H&P Note (Signed)
History and Physical Interval Note:  04/07/2020 8:31 AM  Cory Weber  has presented today for surgery, with the diagnosis of Nonbleeding proximal small bowel AVM at VCE reading 2/24.  Anemia.  FOBT positive stool..  The various methods of treatment have been discussed with the patient and family. After consideration of risks, benefits and other options for treatment, the patient has consented to  Procedure(s): ENTEROSCOPY (N/A) HOT HEMOSTASIS (ARGON PLASMA COAGULATION/BICAP) (N/A) SUBMUCOSAL TATTOO INJECTION  Possible single balloon enteroscopy as a surgical intervention.  The patient's history has been reviewed, patient examined, no change in status, stable for surgery.  I have reviewed the patient's chart and labs.  Questions were answered to the patient's satisfaction.     Dominic Pea Rosene Pilling

## 2020-04-07 NOTE — Anesthesia Preprocedure Evaluation (Addendum)
Anesthesia Evaluation  Patient identified by MRN, date of birth, ID band Patient awake    Reviewed: Allergy & Precautions, NPO status , Patient's Chart, lab work & pertinent test results  Airway Mallampati: II  TM Distance: >3 FB Neck ROM: Full    Dental no notable dental hx. (+) Edentulous Upper, Edentulous Lower   Pulmonary former smoker,  Quit smoking 2017, 47 pack year history    Pulmonary exam normal breath sounds clear to auscultation       Cardiovascular hypertension, Pt. on medications and Pt. on home beta blockers + CAD (see cath 03/21/20), + Past MI, + Peripheral Vascular Disease and +CHF (LVEF 40-45%)  Normal cardiovascular exam Rhythm:Regular Rate:Normal  Echo 03/20/20: 1. Apical hypokinesis. . Left ventricular ejection fraction, by  estimation, is 40 to 45%. The left ventricle has mildly decreased  function. The left ventricle demonstrates regional wall motion  abnormalities (see scoring diagram/findings for  description). There is mild left ventricular hypertrophy. Left ventricular  diastolic parameters are indeterminate.  2. Right ventricular systolic function is normal. The right ventricular  size is normal.  3. Left atrial size was mildly dilated.  4. The mitral valve is normal in structure. No evidence of mitral valve  regurgitation. No evidence of mitral stenosis.  5. The aortic valve has an indeterminant number of cusps. There is mild  calcification of the aortic valve. There is mild thickening of the aortic  valve. Aortic valve regurgitation is not visualized. No aortic stenosis is  present.  6. The inferior vena cava is normal in size with greater than 50%  respiratory variability, suggesting right atrial pressure of 3 mmHg.   Cath 03/21/20: 1. Severe complex CAD. There is a tapering 70% stenosis in the distal Left main involving as well the ostium of the LAD and ramus intermediate with hazy appearance.  The LCx is a co-dominant vessel with extensive dissection involving the ostium and proximal vessel and is is subtotally occluded. 2. Moderate LV dysfunction. EF 40-45% 3. Severely elevated LVEDP 30 mm Hg. NOT candidate for PCI or CABG   Neuro/Psych PSYCHIATRIC DISORDERS CVA, No Residual Symptoms    GI/Hepatic GERD  Medicated and Controlled,(+) Hepatitis -, Cnonbleeding small bowel AVM, anemia, FOBT positive stool   Endo/Other  diabetes, Poorly Controlled, Type 2, Insulin Dependent, Oral Hypoglycemic AgentsMorbid obesityBMI 15  FX at 630 156  Renal/GU CRF and Renal InsufficiencyRenal diseaseCr 1.62  negative genitourinary   Musculoskeletal  (+) Arthritis , Osteoarthritis,    Abdominal (+) + obese,   Peds  Hematology  (+) Blood dyscrasia, anemia , 7.2/23   Anesthesia Other Findings   Reproductive/Obstetrics negative OB ROS                            Anesthesia Physical Anesthesia Plan  ASA: IV  Anesthesia Plan: MAC   Post-op Pain Management:    Induction:   PONV Risk Score and Plan: 2 and Propofol infusion and TIVA  Airway Management Planned: Natural Airway and Simple Face Mask  Additional Equipment: None  Intra-op Plan:   Post-operative Plan:   Informed Consent: I have reviewed the patients History and Physical, chart, labs and discussed the procedure including the risks, benefits and alternatives for the proposed anesthesia with the patient or authorized representative who has indicated his/her understanding and acceptance.   Patient has DNR.  Discussed DNR with patient and Suspend DNR.     Plan Discussed with: CRNA  Anesthesia Plan  Comments: (D/w pt suspend DNR for 24h perioperatively, continue to withhold chest compressions however)       Anesthesia Quick Evaluation

## 2020-04-08 LAB — GLUCOSE, CAPILLARY
Glucose-Capillary: 179 mg/dL — ABNORMAL HIGH (ref 70–99)
Glucose-Capillary: 195 mg/dL — ABNORMAL HIGH (ref 70–99)
Glucose-Capillary: 283 mg/dL — ABNORMAL HIGH (ref 70–99)

## 2020-04-08 LAB — CBC
HCT: 29.6 % — ABNORMAL LOW (ref 39.0–52.0)
Hemoglobin: 9 g/dL — ABNORMAL LOW (ref 13.0–17.0)
MCH: 29 pg (ref 26.0–34.0)
MCHC: 30.4 g/dL (ref 30.0–36.0)
MCV: 95.5 fL (ref 80.0–100.0)
Platelets: 338 10*3/uL (ref 150–400)
RBC: 3.1 MIL/uL — ABNORMAL LOW (ref 4.22–5.81)
RDW: 16.3 % — ABNORMAL HIGH (ref 11.5–15.5)
WBC: 7.7 10*3/uL (ref 4.0–10.5)
nRBC: 0.3 % — ABNORMAL HIGH (ref 0.0–0.2)

## 2020-04-08 LAB — BASIC METABOLIC PANEL
Anion gap: 4 — ABNORMAL LOW (ref 5–15)
BUN: 17 mg/dL (ref 8–23)
CO2: 32 mmol/L (ref 22–32)
Calcium: 9.1 mg/dL (ref 8.9–10.3)
Chloride: 98 mmol/L (ref 98–111)
Creatinine, Ser: 1.48 mg/dL — ABNORMAL HIGH (ref 0.61–1.24)
GFR, Estimated: 52 mL/min — ABNORMAL LOW (ref >60)
Glucose, Bld: 189 mg/dL — ABNORMAL HIGH (ref 70–99)
Potassium: 4.4 mmol/L (ref 3.5–5.1)
Sodium: 134 mmol/L — ABNORMAL LOW (ref 135–145)

## 2020-04-08 LAB — SARS CORONAVIRUS 2 (TAT 6-24 HRS): SARS Coronavirus 2: NEGATIVE

## 2020-04-08 MED ORDER — FLUCONAZOLE 200 MG PO TABS: 200.0000 mg | ORAL_TABLET | Freq: Every day | ORAL | 0 refills | Status: DC

## 2020-04-08 MED ORDER — INSULIN GLARGINE 100 UNIT/ML ~~LOC~~ SOLN: 18.0000 [IU] | Freq: Every day | SUBCUTANEOUS | 11 refills | Status: DC

## 2020-04-08 MED ORDER — PREGABALIN 75 MG PO CAPS: 75.0000 mg | ORAL_CAPSULE | Freq: Every day | ORAL | 2 refills | Status: DC

## 2020-04-08 MED ORDER — ISOSORBIDE MONONITRATE ER 120 MG PO TB24: 120.0000 mg | ORAL_TABLET | Freq: Every day | ORAL | 3 refills | Status: DC

## 2020-04-08 MED ORDER — ASPIRIN 325 MG PO TBEC: 325.0000 mg | DELAYED_RELEASE_TABLET | Freq: Every day | ORAL | 0 refills | Status: DC

## 2020-04-08 MED ORDER — FUROSEMIDE 80 MG PO TABS: 80.0000 mg | ORAL_TABLET | Freq: Every day | ORAL | 3 refills | Status: DC

## 2020-04-08 MED ORDER — HYDRALAZINE HCL 50 MG PO TABS: 50.0000 mg | ORAL_TABLET | Freq: Three times a day (TID) | ORAL | 0 refills | Status: DC

## 2020-04-08 MED ORDER — CLOPIDOGREL BISULFATE 75 MG PO TABS: 75.0000 mg | ORAL_TABLET | Freq: Every day | ORAL | 0 refills | Status: DC

## 2020-04-08 MED ORDER — FERROUS SULFATE 325 (65 FE) MG PO TABS: 325.0000 mg | ORAL_TABLET | Freq: Every day | ORAL | 3 refills | Status: DC

## 2020-04-08 MED ORDER — CARVEDILOL 25 MG PO TABS: 25.0000 mg | ORAL_TABLET | Freq: Two times a day (BID) | ORAL | 3 refills | Status: AC

## 2020-04-08 MED ORDER — AMLODIPINE BESYLATE 10 MG PO TABS: 10.0000 mg | ORAL_TABLET | Freq: Every day | ORAL | 3 refills | Status: DC

## 2020-04-08 MED ORDER — PANTOPRAZOLE SODIUM 40 MG PO TBEC: 40.0000 mg | DELAYED_RELEASE_TABLET | Freq: Every day | ORAL | 1 refills | Status: DC

## 2020-04-08 MED ORDER — ROSUVASTATIN CALCIUM 20 MG PO TABS
20.0000 mg | ORAL_TABLET | Freq: Every day | ORAL | 2 refills | Status: AC
Start: 2020-04-08 — End: ?

## 2020-04-08 MED ORDER — INSULIN LISPRO 100 UNIT/ML ~~LOC~~ SOLN: 4.0000 [IU] | Freq: Three times a day (TID) | SUBCUTANEOUS | 11 refills | Status: DC

## 2020-04-08 NOTE — Progress Notes (Signed)
Patient report given to nurse Valli Glance at the receiving facility. Marcille Blanco, RN

## 2020-04-08 NOTE — Discharge Summary (Addendum)
Physician Discharge Summary  Cory Weber:[id number] DOB: November 20, 1950 DOA: 03/20/2020  PCP: Iona Beard, MD  Admit date: 03/20/2020 Discharge date: 04/08/2020  Admitted From: Home  Disposition: Home   Recommendations for Outpatient Follow-up:  1. Follow up with PCP in 1-2 weeks 2. Please obtain BMP/CBC in one week 3. Monitor Hb. Now patient will be on aspirin and plavix.  4. Needs to follow up with cardiology for further care of HF and CAD.  5. Needs to complete treatment for candida esophagitis.      Discharge Condition: Stable.  CODE STATUS: DNR Diet recommendation: Heart Healthy / Carb Modified   Brief/Interim Summary: 70 year old with past medical history significant for CKD, CVA, hepatitis C, bilateral BKA, hyperlipidemia, BPH, anemia, asthma who presents complaining of acute onset shortness of breath, he is on chronically 2 L of oxygen at baseline, was hypoxic with sats in the 70s upon arrival to the ED. -Patient with a history of intermittent black stool few weeks prior to admission. -Admitted with acute hypoxic respiratory failure secondary to heart failure exacerbation, non-STEMI and anemia. -Patient was transferred from [facility] to [facility] for cardiac catheterization. -Cardiac cath performed 12/8 show: Severe complex CAD, medical management recommended, diuresis with IV Lasix and then transition to oral diuretics. -Patient was also noted to have worsening of chronic anemia, he has received 3 units of packed red blood cells this admission.   1-non-STEMI -Admitted with pulmonary edema, troponin significantly elevated at 12 K, treated with heparin drip and nitrates. -Cardiology consulted, patient underwent cardiac catheterization on 2/8 which show: Severe complex CAD.  There is a tapering 70% stenosis of the distal left main involving as well as the ostium of the LAD and ramus intermediate with hazy appearance.  The LCx is codominant vessel with extensive  dissection involving the ostium and proximal vessel and is subtotally occluded. Medical management recommended, and no amenable to PCI, not a good candidate for CABG per cardiology. -Continue with aspirin, Coreg, indoor high-dose. -keep hb above 8. Hb today at 9.  -He is chest pain free.  -he was started on Imdur, hydralazine.  -ok to resume plavix on 2/27 per GI. Change aspirin to 81 mg.   2-Acute Systolic/Diastolic Heart failure Exacerbation with Acute Hypoxic Respiratory Failure and Pulmonary Edema: -Patient presented with oxygen saturation in the 70s on admission, required nonrebreather mask, secondary to pulmonary edema. -Echo ejection fraction 40 to 45%. -Improved with diuretics, transition to oral Lasix 12/15. -Continue with hydralazine Norvasc and Coreg. -Negative 14 L.  -weight 276---262.  3-Anemia of chronic disease and iron deficiency:  -Hb baseline 9.  -Patient has received 4 units of packed red blood cells this admission.  Heme "positive on admission.  Anemia panel suggestive of chronic disease some mild iron deficiency. -Gastroenterology is consulted, underwent endoscopy 2/18 which noticed some erosive gastropathy and erythematosus mucosa in the fundus, unfortunately patient did not completed his bowel prep.   - He received IV iron.  -GI consulted for inpatient colonoscopy, logistically challenging for outpatient colonoscopy. -Colonoscopy 2/23: Entire colon was normal. -Upper GI endoscopy and video capsule endoscopy 2/23; esophageal plaques, suspicious for candidiasis. Gastritis. Showed AVM small bowel. Underwent enteroscopy 2/25 and 3 AVM  treated with argon coagulation.  -received one unit PRBC 2/24. -hb stable at 9. Plan to transfer to SNF today.  -ok to resume plavix 2/27 per gi.   4-Hyperlipidemia: Continue with the statins.  5-history of CVA: Continue with aspirin and statin.   6-GERD: Continue with  PPI Therapy see: Needs to follow-up as an  outpatient.  7-Diabetes type 2 with nephropathy: Holding Lantus today due to low blood sugar.  Continue with sliding scale insulin  Peripheral vascular disease: Status post left BKA 2017, right BKA 2018  Morbid Obesity BMI 42 needs lifestyle modification.  CKD stage III AAA: Stable around 1.7--- 1.8--1.6  Esophageal candidiasis; Needs 3 weeks of fluconazole.     Discharge Diagnoses:  Principal Problem:   NSTEMI (non-ST elevated myocardial infarction) ([place]) Active Problems:   Polysubstance abuse ([place])   Tobacco abuse   Melena   Acute blood loss anemia   Chronic kidney disease, stage 3 (HCC)   Essential hypertension   Chronic hepatitis C without hepatic coma (HCC)   History of CVA (cerebrovascular accident) without residual deficits   Phantom limb pain (HCC)   Anemia of chronic disease   PVD (peripheral vascular disease) (HCC)   Amputation of left lower extremity below knee (HCC)   GERD (gastroesophageal reflux disease)   Edema of right lower extremity   Uncontrolled type II diabetes with peripheral autonomic neuropathy (HCC)   Dyslipidemia associated with type 2 diabetes mellitus (HCC)   Physical deconditioning   Anemia, chronic disease   Chronic diastolic CHF (congestive heart failure) (HCC)   PAD (peripheral artery disease) ([place])   DNR (do not resuscitate)   Heart failure ([place])   Occult blood in stools   Gastritis and gastroduodenitis   Gastric diverticulum   Grade I internal hemorrhoids   AVM (arteriovenous malformation) of small bowel, acquired    Discharge Instructions  Discharge Instructions    Diet - low sodium heart healthy   Complete by: As directed    Increase activity slowly   Complete by: As directed      Allergies as of 04/08/2020      Reactions   No Known Allergies       Medication List    STOP taking these medications   cefTRIAXone 1 g injection Commonly known as: ROCEPHIN   gabapentin 400 MG capsule Commonly known as:  NEURONTIN   gabapentin 600 MG tablet Commonly known as: NEURONTIN   lisinopril 10 MG tablet Commonly known as: ZESTRIL   metFORMIN 850 MG tablet Commonly known as: GLUCOPHAGE   omeprazole 20 MG capsule Commonly known as: PriLOSEC Replaced by: pantoprazole 40 MG tablet   pantoprazole 20 MG tablet Commonly known as: PROTONIX   senna-docusate 8.6-50 MG tablet Commonly known as: Senokot-S   torsemide 20 MG tablet Commonly known as: DEMADEX     TAKE these medications   acetaminophen 325 MG tablet Commonly known as: TYLENOL Take 2 tablets (650 mg total) by mouth every 6 (six) hours as needed for mild pain or headache (fever >/= 101).   amLODipine 10 MG tablet Commonly known as: NORVASC Take 1 tablet (10 mg total) by mouth daily. What changed:   medication strength  how much to take   aspirin EC 81 MG tablet Take 1 tablet (81 mg total) by mouth daily with breakfast.   baclofen 10 MG tablet Commonly known as: LIORESAL Take 1 tablet (10 mg total) by mouth 3 (three) times daily as needed for muscle spasms. What changed:   when to take this  additional instructions  Another medication with the same name was removed. Continue taking this medication, and follow the directions you see here.   carvedilol 25 MG tablet Commonly known as: COREG Take 1 tablet (25 mg total) by mouth 2 (two) times daily with a  meal. What changed:   medication strength  how much to take   clopidogrel 75 MG tablet Commonly known as: PLAVIX Take 1 tablet (75 mg total) by mouth daily with breakfast. Start taking on: April 09, 2020   cyanocobalamin 1000 MCG tablet Take 500 mcg by mouth daily.   DULoxetine 30 MG capsule Commonly known as: CYMBALTA Take 60 mg by mouth daily.   ferrous sulfate 325 (65 FE) MG tablet Take 1 tablet (325 mg total) by mouth daily with breakfast. What changed: when to take this   fluconazole 200 MG tablet Commonly known as: DIFLUCAN Take 1 tablet (200  mg total) by mouth daily for 19 days.   furosemide 80 MG tablet Commonly known as: LASIX Take 1 tablet (80 mg total) by mouth daily.   hydrALAZINE 50 MG tablet Commonly known as: APRESOLINE Take 1 tablet (50 mg total) by mouth every 8 (eight) hours.   insulin aspart 100 UNIT/ML injection Commonly known as: novoLOG Inject 5 Units into the skin 3 (three) times daily after meals.   insulin glargine 100 UNIT/ML injection Commonly known as: LANTUS Inject 0.18 mLs (18 Units total) into the skin at bedtime. What changed: how much to take   insulin lispro 100 UNIT/ML injection Commonly known as: HUMALOG Inject 0.04 mLs (4 Units total) into the skin 3 (three) times daily before meals. What changed: how much to take   ipratropium-albuterol 0.5-2.5 (3) MG/3ML Soln Commonly known as: DUONEB Take 3 mLs by nebulization every 6 (six) hours as needed (SOB/ wheezing).   isosorbide mononitrate 120 MG 24 hr tablet Commonly known as: IMDUR Take 1 tablet (120 mg total) by mouth daily.   Lidocaine 4 % Ptch Apply 1 patch topically daily. Apply 1 patch to both stumps once daily   melatonin 5 MG Tabs Take 1 tablet by mouth at bedtime.   pantoprazole 40 MG tablet Commonly known as: PROTONIX Take 1 tablet (40 mg total) by mouth daily. Replaces: omeprazole 20 MG capsule   polyethylene glycol 17 g packet Commonly known as: MIRALAX / GLYCOLAX Take 17 g by mouth daily.   pregabalin 75 MG capsule Commonly known as: LYRICA Take 1 capsule (75 mg total) by mouth daily. What changed: when to take this   rosuvastatin 20 MG tablet Commonly known as: CRESTOR Take 1 tablet (20 mg total) by mouth daily. What changed:   medication strength  how much to take   tamsulosin 0.4 MG Caps capsule Commonly known as: FLOMAX Take 1 capsule (0.4 mg total) by mouth daily.   vitamin C 500 MG tablet Commonly known as: ASCORBIC ACID Take 500 mg by mouth 2 (two) times daily.       Follow-up Information     Iona Beard, MD Follow up in 1 week(s).   Specialty: Family Medicine Contact information: [address] [place] [address] [phone]        [place], Peter M, MD Follow up in 2 week(s).   Specialty: Cardiology Contact information: [address] [place] [address] [phone]        Gerrit Heck V, DO Follow up.   Specialty: Gastroenterology Contact information: [address] [place]  [address] [phone]              Allergies  Allergen Reactions  . No Known Allergies     Consultations:  Cardiology  [facility]   Procedures/Studies: CARDIAC CATHETERIZATION  Result Date: 03/21/2020  Mid LM to  Dist LM lesion is 70% stenosed.  Ost LAD to Prox LAD lesion is 70% stenosed.  Ramus lesion is 70% stenosed.  Ost Cx to Mid Cx lesion is 99% stenosed.  Prox RCA lesion is 40% stenosed.  There is moderate left ventricular systolic dysfunction.  LV end diastolic pressure is severely elevated.  The left ventricular ejection fraction is 35-45% by visual estimate.  1. Severe complex CAD. There is a tapering 70% stenosis in the distal Left main involving as well the ostium of the LAD and ramus intermediate with hazy appearance. The LCx is a co-dominant vessel with extensive dissection involving the ostium and proximal vessel and is is subtotally occluded. 2. Moderate LV dysfunction. EF 40-45% 3. Severely elevated LVEDP 30 mm Hg. Plan: patient is not a candidate for PCI. He is likely not a candidate for CABG as well given multiple co-morbidities. Will discuss with rounding team. Likely manage medically. Prognosis is very poor.   DG CHEST PORT 1 VIEW  Result Date: 04/01/2020 CLINICAL DATA:  70 year old male with a history of respiratory distress EXAM: PORTABLE CHEST 1 VIEW COMPARISON:  03/20/2020 FINDINGS: Cardiomediastinal silhouette unchanged in size and contour. Interlobular septal thickening. Fullness in the central  vasculature. No pneumothorax. No pleural effusion. No displaced fracture IMPRESSION: Acute CHF Electronically Signed   By: Corrie Mckusick D.O.   On: 04/01/2020 08:14   DG Chest Port 1 View  Result Date: 03/20/2020 CLINICAL DATA:  Shortness of breath EXAM: PORTABLE CHEST 1 VIEW COMPARISON:  October 21, 2019 FINDINGS: The heart size and mediastinal contours are within normal limits. Mild diffuse interstitial opacities are seen throughout both lungs. No pleural effusion. No acute osseous abnormality. IMPRESSION: Mildly increased interstitial markings throughout both lungs which could be due to interstitial edema or infectious etiology. Electronically Signed   By: Prudencio Pair M.D.   On: 03/20/2020 01:37   ECHOCARDIOGRAM COMPLETE  Result Date: 03/20/2020    ECHOCARDIOGRAM REPORT   Patient Name:   Cory Weber Date of Exam: 03/20/2020 Medical Rec #:  [id number]        Height:       67.0 in Accession #:    [id number]       Weight:       275.6 lb Date of Birth:  1950/04/16        BSA:          2.317 m Patient Age:    75 years         BP:           158/66 mmHg Patient Gender: M                HR:           99 bpm. Exam Location:  [facility] Procedure: 2D Echo, Cardiac Doppler and Color Doppler Indications:    Chest Pain R07.9  History:        Patient has prior history of Echocardiogram examinations, most                 recent 04/01/2018. Risk Factors:Hypertension, Diabetes and                 Dyslipidemia. ETOH abuse, PAD (peripheral artery disease),                 Bilateral BKA, Tobacco abuse.  Sonographer:    Alvino Chapel RCS Referring Phys: [id number] [place]  1. Apical hypokinesis. . Left ventricular ejection fraction, by estimation, is  40 to 45%. The left ventricle has mildly decreased function. The left ventricle demonstrates regional wall motion abnormalities (see scoring diagram/findings for description). There is mild left ventricular hypertrophy. Left ventricular diastolic parameters  are indeterminate.  2. Right ventricular systolic function is normal. The right ventricular size is normal.  3. Left atrial size was mildly dilated.  4. The mitral valve is normal in structure. No evidence of mitral valve regurgitation. No evidence of mitral stenosis.  5. The aortic valve has an indeterminant number of cusps. There is mild calcification of the aortic valve. There is mild thickening of the aortic valve. Aortic valve regurgitation is not visualized. No aortic stenosis is present.  6. The inferior vena cava is normal in size with greater than 50% respiratory variability, suggesting right atrial pressure of 3 mmHg. FINDINGS  Left Ventricle: Apical hypokinesis. Left ventricular ejection fraction, by estimation, is 40 to 45%. The left ventricle has mildly decreased function. The left ventricle demonstrates regional wall motion abnormalities. The left ventricular internal cavity size was normal in size. There is mild left ventricular hypertrophy. Left ventricular diastolic parameters are indeterminate. Right Ventricle: The right ventricular size is normal. No increase in right ventricular wall thickness. Right ventricular systolic function is normal. Left Atrium: Left atrial size was mildly dilated. Right Atrium: Right atrial size was normal in size. Pericardium: There is no evidence of pericardial effusion. Mitral Valve: The mitral valve is normal in structure. No evidence of mitral valve regurgitation. No evidence of mitral valve stenosis. Tricuspid Valve: The tricuspid valve is normal in structure. Tricuspid valve regurgitation is not demonstrated. No evidence of tricuspid stenosis. Aortic Valve: The aortic valve has an indeterminant number of cusps. There is mild calcification of the aortic valve. There is mild thickening of the aortic valve. There is mild aortic valve annular calcification. Aortic valve regurgitation is not visualized. No aortic stenosis is present. Aortic valve mean gradient measures  4.9 mmHg. Aortic valve peak gradient measures 8.7 mmHg. Aortic valve area, by VTI measures 2.09 cm. Pulmonic Valve: The pulmonic valve was not well visualized. Pulmonic valve regurgitation is not visualized. No evidence of pulmonic stenosis. Aorta: The aortic root is normal in size and structure. Pulmonary Artery: Indeterminant PASP, inadequate TR jet. Venous: The inferior vena cava is normal in size with greater than 50% respiratory variability, suggesting right atrial pressure of 3 mmHg. IAS/Shunts: No atrial level shunt detected by color flow Doppler.  LEFT VENTRICLE PLAX 2D LVIDd:         5.30 cm  Diastology LVIDs:         4.50 cm  LV e' medial:    7.51 cm/s LV PW:         1.00 cm  LV E/e' medial:  14.2 LV IVS:        1.20 cm  LV e' lateral:   7.94 cm/s LVOT diam:     2.10 cm  LV E/e' lateral: 13.5 LV SV:         54 LV SV Index:   23 LVOT Area:     3.46 cm  RIGHT VENTRICLE RV S prime:     16.00 cm/s TAPSE (M-mode): 2.5 cm LEFT ATRIUM           Index       RIGHT ATRIUM           Index LA diam:      3.60 cm 1.55 cm/m  RA Area:     17.60 cm LA Vol (A2C):  72.2 ml 31.16 ml/m RA Volume:   52.60 ml  22.70 ml/m LA Vol (A4C): 72.8 ml 31.41 ml/m  AORTIC VALVE AV Area (Vmax):    2.03 cm AV Area (Vmean):   1.81 cm AV Area (VTI):     2.09 cm AV Vmax:           147.67 cm/s AV Vmean:          106.547 cm/s AV VTI:            0.259 m AV Peak Grad:      8.7 mmHg AV Mean Grad:      4.9 mmHg LVOT Vmax:         86.50 cm/s LVOT Vmean:        55.600 cm/s LVOT VTI:          0.156 m LVOT/AV VTI ratio: 0.60  AORTA Ao Root diam: 3.20 cm MITRAL VALVE MV Area (PHT): 2.75 cm     SHUNTS MV Decel Time: 276 msec     Systemic VTI:  0.16 m MV E velocity: 107.00 cm/s  Systemic Diam: 2.10 cm Carlyle Dolly MD Electronically signed by Carlyle Dolly MD Signature Date/Time: 03/20/2020/12:40:27 PM    Final      Subjective: He denies chest pain, he is feeling well.   Discharge Exam: Vitals:   04/08/20 0537 04/08/20 0721  BP: 132/67    Pulse:    Resp:    Temp:  98.9 F (37.2 C)  SpO2:       General: Pt is alert, awake, not in acute distress Cardiovascular: RRR, S1/S2 +, no rubs, no gallops Respiratory: CTA bilaterally, no wheezing, no rhonchi Abdominal: Soft, NT, ND, bowel sounds + Extremities: BL BKA   The results of significant diagnostics from this hospitalization (including imaging, microbiology, ancillary and laboratory) are listed below for reference.     Microbiology: No results found for this or any previous visit (from the past 240 hour(s)).   Labs: BNP (last 3 results) Recent Labs    10/21/19 1138 03/20/20 0119  BNP 90.0 892.1*   Basic Metabolic Panel: Recent Labs  Lab 04/03/20 0239 04/04/20 0910 04/05/20 0248 04/06/20 0313 04/08/20 0307  NA 133* 134* 136 132* 134*  K 4.3 4.3 4.2 3.9 4.4  CL 95* 96* 97* 96* 98  CO2 33* 31 30 30  32  GLUCOSE 112* 158* 47* 151* 189*  BUN 33* 24* 21 20 17   CREATININE 1.86* 1.54* 1.50* 1.62* 1.48*  CALCIUM 8.7* 8.9 8.9 8.7* 9.1   Liver Function Tests: No results for input(s): AST, ALT, ALKPHOS, BILITOT, PROT, ALBUMIN in the last 168 hours. No results for input(s): LIPASE, AMYLASE in the last 168 hours. No results for input(s): AMMONIA in the last 168 hours. CBC: Recent Labs  Lab 04/04/20 0910 04/05/20 0248 04/06/20 0313 04/07/20 0612 04/08/20 0307  WBC 6.2 6.7 7.5 7.2 7.7  HGB 7.6* 8.1* 7.2* 7.9* 9.0*  HCT 24.3* 26.3* 23.0* 24.0* 29.6*  MCV 94.2 97.4 94.7 91.3 95.5  PLT 280 314 289 298 338   Cardiac Enzymes: No results for input(s): CKTOTAL, CKMB, CKMBINDEX, TROPONINI in the last 168 hours. BNP: Invalid input(s): POCBNP CBG: Recent Labs  Lab 04/07/20 1144 04/07/20 1655 04/07/20 2024 04/08/20 0252 04/08/20 0553  GLUCAP 199* 165* 256* 179* 195*   D-Dimer No results for input(s): DDIMER in the last 72 hours. Hgb A1c No results for input(s): HGBA1C in the last 72 hours. Lipid Profile No results for input(s): CHOL, HDL, LDLCALC,  TRIG, CHOLHDL, LDLDIRECT in the last 72 hours. Thyroid function studies No results for input(s): TSH, T4TOTAL, T3FREE, THYROIDAB in the last 72 hours.  Invalid input(s): FREET3 Anemia work up No results for input(s): VITAMINB12, FOLATE, FERRITIN, TIBC, IRON, RETICCTPCT in the last 72 hours. Urinalysis    Component Value Date/Time   COLORURINE YELLOW 03/27/2020 1328   APPEARANCEUR CLEAR 03/27/2020 1328   LABSPEC 1.010 03/27/2020 1328   PHURINE 5.0 03/27/2020 1328   GLUCOSEU NEGATIVE 03/27/2020 1328   HGBUR NEGATIVE 03/27/2020 [place] 03/27/2020 [place] 03/27/2020 [place] 03/27/2020 1328   UROBILINOGEN 1.0 11/29/2013 1705   NITRITE NEGATIVE 03/27/2020 1328   LEUKOCYTESUR TRACE (A) 03/27/2020 1328   Sepsis Labs Invalid input(s): PROCALCITONIN,  WBC,  LACTICIDVEN Microbiology No results found for this or any previous visit (from the past 240 hour(s)).   Time coordinating discharge: 40 minutes  SIGNED:   Elmarie Shiley, MD  [facility]

## 2020-04-08 NOTE — Progress Notes (Signed)
[facility] ROUNDING NOTE   Subjective: Enterography completed yesterday with treatment of 3 small bowel AVMs with APC.  No acute events overnight.  No overt bleeding.  States he otherwise feels well and is hopeful for discharge today.  Objective: Vital signs in last 24 hours: Temp:  [98.1 F (36.7 C)-98.9 F (37.2 C)] 98.5 F (36.9 C) (02/26 1126) Pulse Rate:  [79-92] 85 (02/26 1126) Resp:  [16-22] 20 (02/26 1126) BP: (116-138)/(58-76) 116/75 (02/26 1126) SpO2:  [94 %-99 %] 98 % (02/26 1126) Weight:  [559 kg] 119 kg (02/26 0442) Last BM Date: 04/07/20 General: NAD Abdomen:  Soft, NT, ND    Intake/Output from previous day: 02/25 0701 - 02/26 0700 In: 540 [P.O.:240; I.V.:300] Out: 1600 [Urine:1600] Intake/Output this shift: Total I/O In: 480 [P.O.:480] Out: 350 [Urine:350]   Lab Results: Recent Labs    04/06/20 0313 04/07/20 0612 04/08/20 0307  WBC 7.5 7.2 7.7  HGB 7.2* 7.9* 9.0*  PLT 289 298 338  MCV 94.7 91.3 95.5   BMET Recent Labs    04/06/20 0313 04/08/20 0307  NA 132* 134*  K 3.9 4.4  CL 96* 98  CO2 30 32  GLUCOSE 151* 189*  BUN 20 17  CREATININE 1.62* 1.48*  CALCIUM 8.7* 9.1   LFT No results for input(s): PROT, ALBUMIN, AST, ALT, ALKPHOS, BILITOT, BILIDIR, IBILI in the last 72 hours. PT/INR No results for input(s): INR in the last 72 hours.    Imaging/Other results: No results found.    Assessment and Plan:  1) Small bowel AVM 2) Acute blood loss anemia -AVMs treated with APC without any further overt bleeding.  Repeat hemoglobin this morning 9.0, up from 7.9 yesterday. -Okay to discharge from GI standpoint -Should be okay to resume antiplatelet therapy tomorrow     Lavena Bullion, DO  04/08/2020, 12:55 PM [facility] Pager [phone]

## 2020-04-08 NOTE — Plan of Care (Signed)
  Problem: Education: Goal: Knowledge of General Education information will improve Description: Including pain rating scale, medication(s)/side effects and non-pharmacologic comfort measures Outcome: Progressing   Problem: Clinical Measurements: Goal: Ability to maintain clinical measurements within normal limits will improve Outcome: Progressing   Problem: Clinical Measurements: Goal: Will remain free from infection Outcome: Progressing   Problem: Clinical Measurements: Goal: Diagnostic test results will improve Outcome: Progressing   Problem: Clinical Measurements: Goal: Respiratory complications will improve Outcome: Progressing   Problem: Clinical Measurements: Goal: Cardiovascular complication will be avoided Outcome: Progressing   Problem: Coping: Goal: Level of anxiety will decrease Outcome: Progressing   Problem: Elimination: Goal: Will not experience complications related to bowel motility Outcome: Progressing   Problem: Elimination: Goal: Will not experience complications related to urinary retention Outcome: Progressing   Problem: Education: Goal: Understanding of cardiac disease, CV risk reduction, and recovery process will improve Outcome: Progressing   Problem: Activity: Goal: Ability to tolerate increased activity will improve Outcome: Progressing   Problem: Cardiac: Goal: Ability to achieve and maintain adequate cardiovascular perfusion will improve Outcome: Progressing   Problem: Activity: Goal: Capacity to carry out activities will improve Outcome: Progressing   Problem: Cardiac: Goal: Ability to achieve and maintain adequate cardiopulmonary perfusion will improve Outcome: Progressing

## 2020-04-08 NOTE — TOC Transition Note (Addendum)
Transition of Care [facility]) - CM/SW Discharge Note   Patient Details  Name: Cory Weber MRN: [id number] Date of Birth: 09/09/1950  Transition of Care [facility]) CM/SW Contact:  Bary Castilla, LCSW Phone Number: [phone] 04/08/2020, 10:12 AM   Clinical Narrative:     Patient will DC to: [facility]? Anticipated DC date:?04/08/2020 Family notified:?Jackie Transport by: Corey Harold   Per MD patient ready for DC to [facility].  RN, patient, patient's family, and facility notified of DC. Discharge Summary sent to facility. RN given number for report [phone] room B1 bed 1. DC packet on chart. Ambulance transport requested for patient for 12:00pm. Spoke with Debbie at [place] and she notified CSW that COVID test did not have to come back before patient could be discharged.  CSW signing off.   [place], [place] [phone]   Final next level of care: Long Term Nursing Home Barriers to Discharge: Barriers Resolved   Patient Goals and CMS Choice Patient states their goals for this hospitalization and ongoing recovery are:: Return to [facility].gov Compare Post Acute Care list provided to:: Patient Choice offered to / list presented to : Patient  Discharge Placement              Patient chooses bed at:  [facility]) Patient to be transferred to facility by: [place] Name of family member notified: Tonya Patient and family notified of of transfer: 04/08/20  Discharge Plan and Services In-house Referral: Clinical Social Work Discharge Planning Services: NA Post Acute Care Choice: Nursing Home          DME Arranged: N/A DME Agency: NA       HH Arranged: NA HH Agency: NA        Social Determinants of Health (SDOH) Interventions     Readmission Risk Interventions Readmission Risk Prevention Plan 03/20/2020  Transportation Screening Complete  PCP or Specialist Appt within 3-5 Days Not Complete  Not Complete comments Patient resides at [place] and the  facility schedules hospital follow up appointments.  [place] or Home Care Consult Complete  Social Work Consult for [place] Planning/Counseling Complete  Palliative Care Screening Not Applicable  Medication Review Press photographer) Complete  Some recent data might be hidden

## 2020-04-10 ENCOUNTER — Encounter (HOSPITAL_COMMUNITY): Payer: Self-pay | Admitting: Gastroenterology

## 2020-04-26 ENCOUNTER — Observation Stay (HOSPITAL_COMMUNITY)
Admission: EM | Admit: 2020-04-26 | Discharge: 2020-04-28 | Disposition: A | Discharge status: Home / Self Care | Payer: Medicare (Managed Care) | Attending: Internal Medicine | Admitting: Internal Medicine

## 2020-04-26 ENCOUNTER — Emergency Department (HOSPITAL_COMMUNITY): Payer: Medicare (Managed Care)

## 2020-04-26 DIAGNOSIS — E1122 Type 2 diabetes mellitus with diabetic chronic kidney disease: Secondary | ICD-10-CM | POA: Insufficient documentation

## 2020-04-26 DIAGNOSIS — E6609 Other obesity due to excess calories: Secondary | ICD-10-CM

## 2020-04-26 DIAGNOSIS — Z8673 Personal history of transient ischemic attack (TIA), and cerebral infarction without residual deficits: Secondary | ICD-10-CM | POA: Diagnosis not present

## 2020-04-26 DIAGNOSIS — I509 Heart failure, unspecified: Secondary | ICD-10-CM

## 2020-04-26 DIAGNOSIS — I5043 Acute on chronic combined systolic (congestive) and diastolic (congestive) heart failure: Secondary | ICD-10-CM | POA: Diagnosis not present

## 2020-04-26 DIAGNOSIS — R0689 Other abnormalities of breathing: Secondary | ICD-10-CM

## 2020-04-26 DIAGNOSIS — J9601 Acute respiratory failure with hypoxia: Secondary | ICD-10-CM | POA: Diagnosis present

## 2020-04-26 DIAGNOSIS — N1832 Chronic kidney disease, stage 3b: Secondary | ICD-10-CM | POA: Diagnosis not present

## 2020-04-26 DIAGNOSIS — Z7982 Long term (current) use of aspirin: Secondary | ICD-10-CM | POA: Diagnosis not present

## 2020-04-26 DIAGNOSIS — R9431 Abnormal electrocardiogram [ECG] [EKG]: Secondary | ICD-10-CM | POA: Diagnosis not present

## 2020-04-26 DIAGNOSIS — Z79899 Other long term (current) drug therapy: Secondary | ICD-10-CM | POA: Insufficient documentation

## 2020-04-26 DIAGNOSIS — Z7902 Long term (current) use of antithrombotics/antiplatelets: Secondary | ICD-10-CM | POA: Diagnosis not present

## 2020-04-26 DIAGNOSIS — R0602 Shortness of breath: Secondary | ICD-10-CM | POA: Diagnosis present

## 2020-04-26 DIAGNOSIS — I10 Essential (primary) hypertension: Secondary | ICD-10-CM | POA: Diagnosis present

## 2020-04-26 DIAGNOSIS — Z6835 Body mass index (BMI) 35.0-35.9, adult: Secondary | ICD-10-CM

## 2020-04-26 DIAGNOSIS — J45909 Unspecified asthma, uncomplicated: Secondary | ICD-10-CM | POA: Diagnosis not present

## 2020-04-26 DIAGNOSIS — E1143 Type 2 diabetes mellitus with diabetic autonomic (poly)neuropathy: Secondary | ICD-10-CM | POA: Diagnosis present

## 2020-04-26 DIAGNOSIS — IMO0002 Reserved for concepts with insufficient information to code with codable children: Secondary | ICD-10-CM

## 2020-04-26 DIAGNOSIS — E1169 Type 2 diabetes mellitus with other specified complication: Secondary | ICD-10-CM | POA: Diagnosis present

## 2020-04-26 DIAGNOSIS — I130 Hypertensive heart and chronic kidney disease with heart failure and stage 1 through stage 4 chronic kidney disease, or unspecified chronic kidney disease: Secondary | ICD-10-CM | POA: Insufficient documentation

## 2020-04-26 DIAGNOSIS — N400 Enlarged prostate without lower urinary tract symptoms: Secondary | ICD-10-CM | POA: Diagnosis present

## 2020-04-26 DIAGNOSIS — Z87891 Personal history of nicotine dependence: Secondary | ICD-10-CM | POA: Diagnosis not present

## 2020-04-26 DIAGNOSIS — Z8616 Personal history of COVID-19: Secondary | ICD-10-CM | POA: Insufficient documentation

## 2020-04-26 DIAGNOSIS — Z794 Long term (current) use of insulin: Secondary | ICD-10-CM | POA: Insufficient documentation

## 2020-04-26 DIAGNOSIS — E1165 Type 2 diabetes mellitus with hyperglycemia: Secondary | ICD-10-CM | POA: Diagnosis present

## 2020-04-26 DIAGNOSIS — R0600 Dyspnea, unspecified: Secondary | ICD-10-CM

## 2020-04-26 DIAGNOSIS — E1121 Type 2 diabetes mellitus with diabetic nephropathy: Secondary | ICD-10-CM

## 2020-04-26 DIAGNOSIS — Z20822 Contact with and (suspected) exposure to covid-19: Secondary | ICD-10-CM | POA: Diagnosis not present

## 2020-04-26 DIAGNOSIS — I5023 Acute on chronic systolic (congestive) heart failure: Secondary | ICD-10-CM | POA: Diagnosis present

## 2020-04-26 DIAGNOSIS — K219 Gastro-esophageal reflux disease without esophagitis: Secondary | ICD-10-CM | POA: Diagnosis present

## 2020-04-26 DIAGNOSIS — J9621 Acute and chronic respiratory failure with hypoxia: Secondary | ICD-10-CM

## 2020-04-26 DIAGNOSIS — N183 Chronic kidney disease, stage 3 unspecified: Secondary | ICD-10-CM | POA: Diagnosis present

## 2020-04-26 NOTE — ED Provider Notes (Signed)
[facility] EMERGENCY DEPARTMENT Provider Note   CSN: [id number] Arrival date & time: 04/26/20  2251     History Chief Complaint  Patient presents with  . Shortness of Breath    Cory Weber is a 70 y.o. male.  Patient presents to the emergency department for evaluation of shortness of breath.  Patient developed shortness of breath earlier today.  He does report some cough and congestion.  He has not had any fever.  Denies chest pain.  Patient brought to the ER from nursing facility.  Patient reportedly had room air oxygen saturations of 75% upon arrival of EMS.  He was placed on nonrebreather oxygen and has improved.  Patient reports that he is feeling much better now.        Past Medical History:  Diagnosis Date  . Anemia   . Asthma   . BPH (benign prostatic hyperplasia)   . Cellulitis and abscess of foot 07/2015  . Chronic diastolic heart failure ([place])    per Nursing facility  . Chronic kidney disease    CKD stage 3  . CVA (cerebral vascular accident) ([place]) 2015   denies residual on 08/15/2015  . GERD (gastroesophageal reflux disease)   . Hepatitis C    states he was diagnosed in 2007 or 2007 while living in [place], [place]  . Hepatitis C   . Hyperlipidemia   . Hypertension   . Ischemia of foot 05/31/2016  . Osteomyelitis ([place])    per Nursing Facility  . Peripheral neuropathy   . Pneumonia X 1  . PVD (peripheral vascular disease) ([place])   . Type II diabetes mellitus ([place])   . Wet gangrene ([place]) 06/10/2016    Patient Active Problem List   Diagnosis Date Noted  . Acute on chronic combined systolic and diastolic congestive heart failure ([place]) 04/27/2020  . AVM (arteriovenous malformation) of small bowel, acquired   . Gastritis and gastroduodenitis   . Gastric diverticulum   . Grade I internal hemorrhoids   . Occult blood in stools   . Heart failure ([place]) 03/20/2020  . NSTEMI (non-ST elevated myocardial infarction) ([place]) 03/20/2020  . Pneumonia due to  COVID-19 virus 02/24/2019  . Acute respiratory failure with hypoxia ([place]) 02/20/2019  . COVID-19 virus detected 02/20/2019  . UTI (urinary tract infection) 02/20/2019  . DNR (do not resuscitate) 02/20/2019  . PAD (peripheral artery disease) ([place]) 08/22/2016  . Osteomyelitis of foot ([place]) 07/08/2016  . S/P transmetatarsal amputation of foot, right ([place]) 06/26/2016  . Constipation due to opioid therapy 06/15/2016  . Physical deconditioning 06/10/2016  . Wet gangrene ([place]) 06/10/2016  . Anemia, chronic disease 06/10/2016  . Chronic diastolic CHF (congestive heart failure) ([place]) 06/10/2016  . Ischemic pain of right foot 06/04/2016  . Ischemia of foot 05/31/2016  . 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 Dyslipidemia associated with type 2 diabetes mellitus ([place]) 01/21/2016  . Uncontrolled type II diabetes with peripheral autonomic neuropathy ([place]) 01/17/2016  . Weight gain 12/20/2015  . Edema of right lower extremity 12/20/2015  . Cough 11/26/2015  . GERD (gastroesophageal reflux disease) 10/08/2015  . Amputation of left lower extremity below knee ([place]) 08/21/2015  . Phantom limb pain ([place])   . Anemia of chronic disease   . BPH (benign prostatic hyperplasia)   . ETOH abuse   . PVD (peripheral vascular disease) ([place])   . Chronic hepatitis C without hepatic coma ([place])   . History of CVA (  cerebrovascular accident) without residual deficits   . Hyponatremia   . Chronic kidney disease, stage 3 ([place]) 07/18/2015  . Essential hypertension 07/18/2015  . Acute blood loss anemia 12/01/2013  . S/P lumbar spinal fusion 11/25/2013  . Melena 12/03/2011  . Hemiparesthesia 02/03/2011  . Polysubstance abuse ([place]) 02/03/2011  . Tobacco abuse 02/03/2011    Past Surgical History:  Procedure Laterality Date  . ABDOMINAL AORTOGRAM N/A 06/03/2016   Procedure: Abdominal Aortogram;  Surgeon: Waynetta Sandy, MD;  Location: [place] CV  LAB;  Service: Cardiovascular;  Laterality: N/A;  . AMPUTATION Left 08/16/2015   Procedure: LEFT BELOW THE KNEE AMPUTATION ;  Surgeon: Serafina Mitchell, MD;  Location: [place];  Service: Vascular;  Laterality: Left;  . AMPUTATION Right 08/22/2016   Procedure: AMPUTATION BELOW KNEE-RIGHT;  Surgeon: Waynetta Sandy, MD;  Location: [place];  Service: Vascular;  Laterality: Right;  . BACK SURGERY    . BELOW KNEE LEG AMPUTATION Left 08/16/2015  . BIOPSY N/A 09/03/2012   Procedure: BIOPSY;  Surgeon: Daneil Dolin, MD;  [facility]: [facility];  Service: Endoscopy;  Laterality: N/A;  gastric and gastric mucosa  . BIOPSY N/A 12/03/2012   Procedure: BIOPSY;  Surgeon: Daneil Dolin, MD;  Location: [facility];  Service: Endoscopy;  Laterality: N/A;  . BIOPSY  03/31/2020   Procedure: BIOPSY;  Surgeon: Ladene Artist, MD;  Location: [place];  Service: Endoscopy;;  . COLONOSCOPY WITH PROPOFOL N/A 09/03/2012   PTW:SFKCLEX polyp-removed as outlined above. Prominent internal hemorrhoids. Tubular adenoma  . COLONOSCOPY WITH PROPOFOL N/A 04/05/2020   Procedure: COLONOSCOPY WITH PROPOFOL;  Surgeon: Lavena Bullion, DO;  Location: [place];  Service: Gastroenterology;  Laterality: N/A;  . ENTEROSCOPY Left 04/05/2020   Procedure: ENTEROSCOPY;  Surgeon: Lavena Bullion, DO;  Location: [place];  Service: Gastroenterology;  Laterality: Left;  . ENTEROSCOPY N/A 04/07/2020   Procedure: ENTEROSCOPY;  Surgeon: Lavena Bullion, DO;  Location: [place];  Service: Gastroenterology;  Laterality: N/A;  . ESOPHAGOGASTRODUODENOSCOPY (EGD) WITH PROPOFOL N/A 09/03/2012   NTZ:GYFVCB hernia. Gastric diverticulum. Gastric ulcers with associated erosions. Duodenal erosions. Status post gastric biopsy. H.PYLORI gastritis   . ESOPHAGOGASTRODUODENOSCOPY (EGD) WITH PROPOFOL N/A 12/03/2012   Dr. Gala Romney: gastric diverticulum, gastric erosions and scar. Previously noted gastric ulcer completed healed. Biopsy without H.pylori.    . ESOPHAGOGASTRODUODENOSCOPY (EGD) WITH PROPOFOL N/A 01/23/2016   Procedure: ESOPHAGOGASTRODUODENOSCOPY (EGD) WITH PROPOFOL;  Surgeon: Doran Stabler, MD;  Location: [facility];  Service: Gastroenterology;  Laterality: N/A;  . ESOPHAGOGASTRODUODENOSCOPY (EGD) WITH PROPOFOL N/A 03/31/2020   Procedure: ESOPHAGOGASTRODUODENOSCOPY (EGD) WITH PROPOFOL;  Surgeon: Ladene Artist, MD;  Location: [facility] ENDOSCOPY;  Service: Endoscopy;  Laterality: N/A;  . GIVENS CAPSULE STUDY N/A 04/05/2020   Procedure: GIVENS CAPSULE STUDY;  Surgeon: Lavena Bullion, DO;  Location: [place];  Service: Gastroenterology;  Laterality: N/A;  . HOT HEMOSTASIS N/A 04/07/2020   Procedure: HOT HEMOSTASIS (ARGON PLASMA COAGULATION/BICAP);  Surgeon: Lavena Bullion, DO;  Location: [facility]COPY;  Service: Gastroenterology;  Laterality: N/A;  . I & D EXTREMITY Right 07/11/2016   Procedure: DELAY PRIMARY CLOSURE FOOT AMPUTATION;  Surgeon: Edrick Kins, DPM;  Location: [place];  Service: Podiatry;  Laterality: Right;  . LEFT HEART CATH AND CORONARY ANGIOGRAPHY N/A 03/21/2020   Procedure: LEFT HEART CATH AND CORONARY ANGIOGRAPHY;  Surgeon: Martinique, Peter M, MD;  Location: [place] CV LAB;  Service: Cardiovascular;  Laterality: N/A;  . LIVER BIOPSY  2005   Done  in [place], [place]. Chronic hepatitis with mild periportal inflammation, lobular unicellular necrosis and portal fibrosis. Grade 2, stage 1-2.  . LOWER EXTREMITY ANGIOGRAPHY Right 06/03/2016   Procedure: Lower Extremity Angiography;  Surgeon: Waynetta Sandy, MD;  Location: [place] CV LAB;  Service: Cardiovascular;  Laterality: Right;  . MAXIMUM ACCESS (MAS)POSTERIOR LUMBAR INTERBODY FUSION (PLIF) 1 LEVEL N/A 11/25/2013   Procedure: FOR MAXIMUM ACCESS (MAS) POSTERIOR LUMBAR INTERBODY FUSION (PLIF) 1 LEVEL;  Surgeon: Eustace Moore, MD;  Location: [place] NEURO ORS;  Service: Neurosurgery;  Laterality: N/A;  FOR MAXIMUM ACCESS (MAS) POSTERIOR LUMBAR INTERBODY FUSION  (PLIF) 1 LEVEL LUMBAR 3-4  . PERIPHERAL VASCULAR ATHERECTOMY Right 06/03/2016   Procedure: Peripheral Vascular Atherectomy;  Surgeon: Waynetta Sandy, MD;  Location: [place] CV LAB;  Service: Cardiovascular;  Laterality: Right;  SFA WITH STENT  . PERIPHERAL VASCULAR CATHETERIZATION Left 08/01/2015   Procedure: Lower Extremity Angiography;  Surgeon: Serafina Mitchell, MD;  Location: [place] CV LAB;  Service: Cardiovascular;  Laterality: Left;  . PERIPHERAL VASCULAR CATHETERIZATION N/A 08/01/2015   Procedure: Abdominal Aortogram;  Surgeon: Serafina Mitchell, MD;  Location: [place] CV LAB;  Service: Cardiovascular;  Laterality: N/A;  . PERIPHERAL VASCULAR CATHETERIZATION N/A 08/08/2015   Procedure: Abdominal Aortogram w/Lower Extremity;  Surgeon: Serafina Mitchell, MD;  Location: [place] CV LAB;  Service: Cardiovascular;  Laterality: N/A;  . PERIPHERAL VASCULAR CATHETERIZATION Left 08/08/2015   Procedure: Peripheral Vascular Balloon Angioplasty;  Surgeon: Serafina Mitchell, MD;  Location: [place] CV LAB;  Service: Cardiovascular;  Laterality: Left;  left popiteal artery, left peronealtrunk, left post tibial  . POLYPECTOMY N/A 09/03/2012   Procedure: POLYPECTOMY;  Surgeon: Daneil Dolin, MD;  [facility];  Service: Endoscopy;  Laterality: N/A;  cecal polyp  . SUBMUCOSAL TATTOO INJECTION  04/07/2020   Procedure: SUBMUCOSAL TATTOO INJECTION;  Surgeon: Lavena Bullion, DO;  Location: [facility];  Service: Gastroenterology;;  . TONSILLECTOMY    . TRANSMETATARSAL AMPUTATION Right 06/13/2016   Procedure: RIGHT TRANSMETATARSAL AMPUTATION;  Surgeon: Waynetta Sandy, MD;  Location: [place];  Service: Vascular;  Laterality: Right;  . TRANSMETATARSAL AMPUTATION Right 07/10/2016   Procedure: REVISION RIGHT TRANSMETATARSAL AMPUTATION;  Surgeon: Waynetta Sandy, MD;  Location: [facility];  Service: Vascular;  Laterality: Right;       Family History  Problem Relation Age of  Onset  . Breast cancer Mother   . Heart failure Mother   . Diabetes Father   . Hypertension Father   . Heart disease Father   . Hyperlipidemia Father   . Diabetes Sister   . Hypertension Sister   . Breast cancer Sister   . Colon cancer Neg Hx   . Liver disease Neg Hx     Social History   Tobacco Use  . Smoking status: Former Smoker    Packs/day: 1.00    Years: 47.00    Pack years: 47.00    Types: Cigarettes    Quit date: 07/13/2015    Years since quitting: 4.7  . Smokeless tobacco: Never Used  Vaping Use  . Vaping Use: Never used  Substance Use Topics  . Alcohol use: No    Alcohol/week: 0.0 standard drinks    Comment:  none 08/15/2015 "stopped drinking 3-4 years ago"  . Drug use: No    Comment: 08/15/2015 "last used cocaine 3-4 months ago"    Home Medications Prior to Admission medications   Medication Sig Start Date End Date Taking? Authorizing Provider  acetaminophen (TYLENOL) 325 MG tablet Take 2 tablets (650 mg total) by mouth every 6 (six) hours as needed for mild pain or headache (fever >/= 101). 02/24/19   Roxan Hockey, MD  amLODipine (NORVASC) 10 MG tablet Take 1 tablet (10 mg total) by mouth daily. 04/08/20   Regalado, Jerald Kief A, MD  aspirin EC 81 MG tablet Take 1 tablet (81 mg total) by mouth daily with breakfast. Patient not taking: Reported on 03/20/2020 02/24/19   Roxan Hockey, MD  baclofen (LIORESAL) 10 MG tablet Take 1 tablet (10 mg total) by mouth 3 (three) times daily as needed for muscle spasms. Patient taking differently: Take 10 mg by mouth 2 (two) times daily. For shoulder spasm 02/25/19   Roxan Hockey, MD  carvedilol (COREG) 25 MG tablet Take 1 tablet (25 mg total) by mouth 2 (two) times daily with a meal. 04/08/20   Regalado, Belkys A, MD  clopidogrel (PLAVIX) 75 MG tablet Take 1 tablet (75 mg total) by mouth daily with breakfast. 04/09/20   Regalado, Belkys A, MD  cyanocobalamin 1000 MCG tablet Take 500 mcg by mouth daily.     [provider]  DULoxetine (CYMBALTA) 30 MG capsule Take 60 mg by mouth daily. 10/20/19   [provider]  ferrous sulfate 325 (65 FE) MG tablet Take 1 tablet (325 mg total) by mouth daily with breakfast. 04/08/20   Regalado, Belkys A, MD  fluconazole (DIFLUCAN) 200 MG tablet Take 1 tablet (200 mg total) by mouth daily for 19 days. 04/08/20 04/27/20  Regalado, Belkys A, MD  furosemide (LASIX) 80 MG tablet Take 1 tablet (80 mg total) by mouth daily. 04/08/20   Regalado, Belkys A, MD  hydrALAZINE (APRESOLINE) 50 MG tablet Take 1 tablet (50 mg total) by mouth every 8 (eight) hours. 04/08/20   Regalado, Belkys A, MD  insulin aspart (NOVOLOG) 100 UNIT/ML injection Inject 5 Units into the skin 3 (three) times daily after meals. Patient not taking: Reported on 03/20/2020 07/15/16   Domenic Polite, MD  insulin glargine (LANTUS) 100 UNIT/ML injection Inject 0.18 mLs (18 Units total) into the skin at bedtime. 04/08/20   Regalado, Belkys A, MD  insulin lispro (HUMALOG) 100 UNIT/ML injection Inject 0.04 mLs (4 Units total) into the skin 3 (three) times daily before meals. 04/08/20   Regalado, Belkys A, MD  ipratropium-albuterol (DUONEB) 0.5-2.5 (3) MG/3ML SOLN Take 3 mLs by nebulization every 6 (six) hours as needed (SOB/ wheezing).    [provider]  isosorbide mononitrate (IMDUR) 120 MG 24 hr tablet Take 1 tablet (120 mg total) by mouth daily. 04/08/20   Regalado, Belkys A, MD  Lidocaine 4 % PTCH Apply 1 patch topically daily. Apply 1 patch to both stumps once daily    [provider]  Melatonin 5 MG TABS Take 1 tablet by mouth at bedtime.    [provider]  pantoprazole (PROTONIX) 40 MG tablet Take 1 tablet (40 mg total) by mouth daily. 04/08/20   Regalado, Belkys A, MD  polyethylene glycol (MIRALAX / GLYCOLAX) 17 g packet Take 17 g by mouth daily.    [provider]  pregabalin (LYRICA) 75 MG capsule Take 1 capsule (75 mg total) by mouth daily. 04/08/20   Regalado, Belkys A, MD   rosuvastatin (CRESTOR) 20 MG tablet Take 1 tablet (20 mg total) by mouth daily. 04/08/20   Regalado, Belkys A, MD  tamsulosin (FLOMAX) 0.4 MG CAPS capsule Take 1 capsule (0.4 mg total) by mouth daily. Patient not taking:  Reported on 03/20/2020 08/10/15   Cristal Ford, DO  vitamin C (ASCORBIC ACID) 500 MG tablet Take 500 mg by mouth 2 (two) times daily.    [provider]    Allergies    No known allergies  Review of Systems   Review of Systems  Respiratory: Positive for cough and shortness of breath.   All other systems reviewed and are negative.   Physical Exam Updated Vital Signs BP 130/69 (BP Location: Right Arm)   Pulse 86   Temp 98.1 F (36.7 C)   Resp 15   Ht 6' (1.829 m)   Wt 121.3 kg   SpO2 100%   BMI 36.27 kg/m   Physical Exam Vitals and nursing note reviewed.  Constitutional:      General: He is not in acute distress.    Appearance: Normal appearance. He is well-developed.  HENT:     Head: Normocephalic and atraumatic.     Right Ear: Hearing normal.     Left Ear: Hearing normal.     Nose: Nose normal.  Eyes:     Conjunctiva/sclera: Conjunctivae normal.     Pupils: Pupils are equal, round, and reactive to light.  Cardiovascular:     Rate and Rhythm: Regular rhythm.     Heart sounds: S1 normal and S2 normal. No murmur heard. No friction rub. No gallop.   Pulmonary:     Effort: Pulmonary effort is normal. No respiratory distress.     Breath sounds: Decreased breath sounds, rhonchi and rales present.  Chest:     Chest wall: No tenderness.  Abdominal:     General: Bowel sounds are normal.     Palpations: Abdomen is soft.     Tenderness: There is no abdominal tenderness. There is no guarding or rebound. Negative signs include Murphy's sign and McBurney's sign.     Hernia: No hernia is present.  Musculoskeletal:        General: Normal range of motion.     Cervical back: Normal range of motion and neck supple.     Right Lower Extremity: Right leg  is amputated below knee.     Left Lower Extremity: Left leg is amputated below knee.  Skin:    General: Skin is warm and dry.     Findings: No rash.  Neurological:     Mental Status: He is alert and oriented to person, place, and time.     GCS: GCS eye subscore is 4. GCS verbal subscore is 5. GCS motor subscore is 6.     Cranial Nerves: No cranial nerve deficit.     Sensory: No sensory deficit.     Coordination: Coordination normal.  Psychiatric:        Speech: Speech normal.        Behavior: Behavior normal.        Thought Content: Thought content normal.     ED Results / Procedures / Treatments   Labs (all labs ordered are listed, but only abnormal results are displayed) Labs Reviewed  CBC WITH DIFFERENTIAL/PLATELET - Abnormal; Notable for the following components:      Result Value   RBC 2.44 (*)    Hemoglobin 7.5 (*)    HCT 24.2 (*)    RDW 16.4 (*)    All other components within normal limits  COMPREHENSIVE METABOLIC PANEL - Abnormal; Notable for the following components:   Sodium 134 (*)    Chloride 94 (*)    CO2 33 (*)  Glucose, Bld 340 (*)    BUN 24 (*)    Creatinine, Ser 1.91 (*)    Calcium 8.4 (*)    Total Protein 9.9 (*)    Albumin 3.0 (*)    AST 12 (*)    GFR, Estimated 38 (*)    All other components within normal limits  BRAIN NATRIURETIC PEPTIDE - Abnormal; Notable for the following components:   B Natriuretic Peptide 352.0 (*)    All other components within normal limits  TROPONIN I (HIGH SENSITIVITY) - Abnormal; Notable for the following components:   Troponin I (High Sensitivity) 30 (*)    All other components within normal limits  RESP PANEL BY RT-PCR (FLU A&B, COVID) ARPGX2  LACTIC ACID, PLASMA  TYPE AND SCREEN  TROPONIN I (HIGH SENSITIVITY)    EKG EKG Interpretation  Date/Time:  Wednesday April 26 2020 22:55:29 EDT Ventricular Rate:  95 PR Interval:    QRS Duration: 94 QT Interval:  387 QTC Calculation: 487 R Axis:   81 Text  Interpretation: Sinus rhythm Borderline right axis deviation Abnormal inferior Q waves Borderline repolarization abnormality Borderline prolonged QT interval No significant change since last tracing Confirmed by Orpah Greek ([id number]) on 04/26/2020 11:14:40 PM   Radiology DG Chest Port 1 View  Result Date: 04/26/2020 CLINICAL DATA:  Shortness of breath, chest pain EXAM: PORTABLE CHEST 1 VIEW COMPARISON:  04/01/2020 FINDINGS: Single frontal view of the chest demonstrates a stable cardiac silhouette. There is central vascular congestion, with mild bilateral perihilar airspace disease greatest in the infrahilar regions. No effusion or pneumothorax. IMPRESSION: 1. Findings consistent with mild congestive heart failure. Electronically Signed   By: Randa Ngo M.D.   On: 04/26/2020 23:34    Procedures Procedures   Medications Ordered in ED Medications  furosemide (LASIX) injection 80 mg (has no administration in time range)    ED Course  I have reviewed the triage vital signs and the nursing notes.  Pertinent labs & imaging results that were available during my care of the patient were reviewed by me and considered in my medical decision making (see chart for details).    MDM Rules/Calculators/A&P                          Patient brought to the emergency department after developing respiratory distress at the nursing home.  Patient was found to be very hypoxic.  Patient was placed on a nonrebreather and transported.  He reports that he feels much improved on the increased oxygen.  Patient does have a history of congestive heart failure.  Recently had Covid and he is now oxygen dependent since then, normally on 2 L.  Patient hospitalized a month ago with GI bleed resulting in NSTEMI.  He has not experiencing chest pain.  First troponin is only slightly elevated at 30.  He is anemic, but about at his normal baseline.  Work-up looks like congestive heart failure, administered Lasix 80 mg.   We are currently titrating down his oxygen and he seems to be tolerating it.  Will admit for further management.  Final Clinical Impression(s) / ED Diagnoses Final diagnoses:  Acute on chronic congestive heart failure, unspecified heart failure type [facility])    Rx / DC Orders ED Discharge Orders    None       Orpah Greek, MD 04/27/20 [phone]

## 2020-04-26 NOTE — ED Triage Notes (Addendum)
BIB EMS c/o sob at nursing facility. 75% on RA, 91% on NRB

## 2020-04-27 DIAGNOSIS — I5043 Acute on chronic combined systolic (congestive) and diastolic (congestive) heart failure: Secondary | ICD-10-CM

## 2020-04-27 DIAGNOSIS — R9431 Abnormal electrocardiogram [ECG] [EKG]: Secondary | ICD-10-CM | POA: Diagnosis present

## 2020-04-27 DIAGNOSIS — I5023 Acute on chronic systolic (congestive) heart failure: Secondary | ICD-10-CM | POA: Diagnosis present

## 2020-04-27 LAB — LACTIC ACID, PLASMA: Lactic Acid, Venous: 0.8 mmol/L (ref 0.5–1.9)

## 2020-04-27 LAB — COMPREHENSIVE METABOLIC PANEL
ALT: 11 U/L (ref 0–44)
AST: 12 U/L — ABNORMAL LOW (ref 15–41)
Albumin: 3 g/dL — ABNORMAL LOW (ref 3.5–5.0)
Alkaline Phosphatase: 74 U/L (ref 38–126)
Anion gap: 7 (ref 5–15)
BUN: 24 mg/dL — ABNORMAL HIGH (ref 8–23)
CO2: 33 mmol/L — ABNORMAL HIGH (ref 22–32)
Calcium: 8.4 mg/dL — ABNORMAL LOW (ref 8.9–10.3)
Chloride: 94 mmol/L — ABNORMAL LOW (ref 98–111)
Creatinine, Ser: 1.91 mg/dL — ABNORMAL HIGH (ref 0.61–1.24)
GFR, Estimated: 38 mL/min — ABNORMAL LOW (ref >60)
Glucose, Bld: 340 mg/dL — ABNORMAL HIGH (ref 70–99)
Potassium: 4.1 mmol/L (ref 3.5–5.1)
Sodium: 134 mmol/L — ABNORMAL LOW (ref 135–145)
Total Bilirubin: 0.3 mg/dL (ref 0.3–1.2)
Total Protein: 9.9 g/dL — ABNORMAL HIGH (ref 6.5–8.1)

## 2020-04-27 LAB — CBC WITH DIFFERENTIAL/PLATELET
Abs Immature Granulocytes: 0.03 10*3/uL (ref 0.00–0.07)
Basophils Absolute: 0 10*3/uL (ref 0.0–0.1)
Basophils Relative: 0 %
Eosinophils Absolute: 0.1 10*3/uL (ref 0.0–0.5)
Eosinophils Relative: 2 %
HCT: 24.2 % — ABNORMAL LOW (ref 39.0–52.0)
Hemoglobin: 7.5 g/dL — ABNORMAL LOW (ref 13.0–17.0)
Immature Granulocytes: 1 %
Lymphocytes Relative: 16 %
Lymphs Abs: 1 10*3/uL (ref 0.7–4.0)
MCH: 30.7 pg (ref 26.0–34.0)
MCHC: 31 g/dL (ref 30.0–36.0)
MCV: 99.2 fL (ref 80.0–100.0)
Monocytes Absolute: 0.3 10*3/uL (ref 0.1–1.0)
Monocytes Relative: 4 %
Neutro Abs: 4.8 10*3/uL (ref 1.7–7.7)
Neutrophils Relative %: 77 %
Platelets: 290 10*3/uL (ref 150–400)
RBC: 2.44 MIL/uL — ABNORMAL LOW (ref 4.22–5.81)
RDW: 16.4 % — ABNORMAL HIGH (ref 11.5–15.5)
WBC: 6.2 10*3/uL (ref 4.0–10.5)
nRBC: 0 % (ref 0.0–0.2)

## 2020-04-27 LAB — GLUCOSE, CAPILLARY
Glucose-Capillary: 159 mg/dL — ABNORMAL HIGH (ref 70–99)
Glucose-Capillary: 135 mg/dL — ABNORMAL HIGH (ref 70–99)
Glucose-Capillary: 232 mg/dL — ABNORMAL HIGH (ref 70–99)
Glucose-Capillary: 336 mg/dL — ABNORMAL HIGH (ref 70–99)

## 2020-04-27 LAB — TYPE AND SCREEN
ABO/RH(D): O POS
Antibody Screen: NEGATIVE

## 2020-04-27 LAB — RESP PANEL BY RT-PCR (FLU A&B, COVID) ARPGX2
Influenza A by PCR: NEGATIVE
Influenza B by PCR: NEGATIVE
SARS Coronavirus 2 by RT PCR: NEGATIVE

## 2020-04-27 LAB — TROPONIN I (HIGH SENSITIVITY)
Troponin I (High Sensitivity): 30 ng/L — ABNORMAL HIGH (ref <18)
Troponin I (High Sensitivity): 103 ng/L (ref <18)
Troponin I (High Sensitivity): 427 ng/L (ref <18)

## 2020-04-27 LAB — BRAIN NATRIURETIC PEPTIDE: B Natriuretic Peptide: 352 pg/mL — ABNORMAL HIGH (ref 0.0–100.0)

## 2020-04-27 LAB — CBG MONITORING, ED
Glucose-Capillary: 326 mg/dL — ABNORMAL HIGH (ref 70–99)
Glucose-Capillary: 285 mg/dL — ABNORMAL HIGH (ref 70–99)

## 2020-04-27 MED ORDER — ROSUVASTATIN CALCIUM 20 MG PO TABS
20.0000 mg | ORAL_TABLET | Freq: Every day | ORAL | Status: DC
  Administered 2020-04-27 – 2020-04-28 (×2): 20 mg via ORAL
  Filled 2020-04-27 (×2): qty 1

## 2020-04-27 MED ORDER — PROSOURCE PLUS PO LIQD
30.0000 mL | Freq: Two times a day (BID) | ORAL | Status: DC
  Administered 2020-04-27 – 2020-04-28 (×4): 30 mL via ORAL
  Filled 2020-04-27 (×3): qty 30

## 2020-04-27 MED ORDER — ASCORBIC ACID 500 MG PO TABS
500.0000 mg | ORAL_TABLET | Freq: Two times a day (BID) | ORAL | Status: DC
  Administered 2020-04-27 – 2020-04-28 (×3): 500 mg via ORAL
  Filled 2020-04-27 (×3): qty 1

## 2020-04-27 MED ORDER — AMLODIPINE BESYLATE 5 MG PO TABS
10.0000 mg | ORAL_TABLET | Freq: Every day | ORAL | Status: DC
  Administered 2020-04-27 – 2020-04-28 (×2): 10 mg via ORAL
  Filled 2020-04-27 (×2): qty 2

## 2020-04-27 MED ORDER — FUROSEMIDE 10 MG/ML IJ SOLN
80.0000 mg | Freq: Once | INTRAMUSCULAR | Status: AC
  Administered 2020-04-27: 80 mg via INTRAVENOUS
  Filled 2020-04-27: qty 8

## 2020-04-27 MED ORDER — ASPIRIN EC 81 MG PO TBEC
81.0000 mg | DELAYED_RELEASE_TABLET | Freq: Every day | ORAL | Status: DC
  Administered 2020-04-27 – 2020-04-28 (×2): 81 mg via ORAL
  Filled 2020-04-27 (×2): qty 1

## 2020-04-27 MED ORDER — ADULT MULTIVITAMIN W/MINERALS CH
1.0000 | ORAL_TABLET | Freq: Every day | ORAL | Status: DC
  Administered 2020-04-27 – 2020-04-28 (×2): 1 via ORAL
  Filled 2020-04-27 (×2): qty 1

## 2020-04-27 MED ORDER — MAGNESIUM SULFATE 2 GM/50ML IV SOLN
2.0000 g | Freq: Once | INTRAVENOUS | Status: AC
  Administered 2020-04-27: 2 g via INTRAVENOUS
  Filled 2020-04-27: qty 50

## 2020-04-27 MED ORDER — ENSURE ENLIVE PO LIQD
237.0000 mL | Freq: Two times a day (BID) | ORAL | Status: DC
  Administered 2020-04-27: 237 mL via ORAL

## 2020-04-27 MED ORDER — ENSURE ENLIVE PO LIQD
237.0000 mL | Freq: Every day | ORAL | Status: DC
  Administered 2020-04-28: 237 mL via ORAL

## 2020-04-27 MED ORDER — VITAMIN B-12 100 MCG PO TABS
500.0000 ug | ORAL_TABLET | Freq: Every day | ORAL | Status: DC
  Administered 2020-04-27 – 2020-04-28 (×2): 500 ug via ORAL
  Filled 2020-04-27 (×2): qty 5

## 2020-04-27 MED ORDER — FUROSEMIDE 10 MG/ML IJ SOLN
40.0000 mg | Freq: Two times a day (BID) | INTRAMUSCULAR | Status: DC
  Administered 2020-04-27 – 2020-04-28 (×3): 40 mg via INTRAVENOUS
  Filled 2020-04-27 (×3): qty 4

## 2020-04-27 MED ORDER — HYDRALAZINE HCL 25 MG PO TABS
50.0000 mg | ORAL_TABLET | Freq: Three times a day (TID) | ORAL | Status: DC
  Administered 2020-04-27 – 2020-04-28 (×5): 50 mg via ORAL
  Filled 2020-04-27 (×4): qty 2

## 2020-04-27 MED ORDER — ACETAMINOPHEN 325 MG PO TABS
650.0000 mg | ORAL_TABLET | Freq: Four times a day (QID) | ORAL | Status: DC | PRN
  Administered 2020-04-27: 650 mg via ORAL
  Filled 2020-04-27: qty 2

## 2020-04-27 MED ORDER — DULOXETINE HCL 60 MG PO CPEP
60.0000 mg | ORAL_CAPSULE | Freq: Every day | ORAL | Status: DC
  Administered 2020-04-27 – 2020-04-28 (×2): 60 mg via ORAL
  Filled 2020-04-27 (×2): qty 1

## 2020-04-27 MED ORDER — INSULIN GLARGINE 100 UNIT/ML ~~LOC~~ SOLN
18.0000 [IU] | Freq: Every day | SUBCUTANEOUS | Status: DC
  Administered 2020-04-27: 18 [IU] via SUBCUTANEOUS
  Filled 2020-04-27 (×4): qty 0.18

## 2020-04-27 MED ORDER — FLUCONAZOLE 100 MG PO TABS
200.0000 mg | ORAL_TABLET | Freq: Every day | ORAL | Status: AC
  Administered 2020-04-27: 200 mg via ORAL
  Filled 2020-04-27: qty 2

## 2020-04-27 MED ORDER — ACETAMINOPHEN 650 MG RE SUPP: 650.0000 mg | Freq: Four times a day (QID) | RECTAL | Status: DC | PRN

## 2020-04-27 MED ORDER — PROCHLORPERAZINE EDISYLATE 10 MG/2ML IJ SOLN: 5.0000 mg | INTRAMUSCULAR | Status: DC | PRN

## 2020-04-27 MED ORDER — IPRATROPIUM-ALBUTEROL 0.5-2.5 (3) MG/3ML IN SOLN: 3.0000 mL | Freq: Four times a day (QID) | RESPIRATORY_TRACT | Status: DC | PRN

## 2020-04-27 MED ORDER — MELATONIN 3 MG PO TABS
6.0000 mg | ORAL_TABLET | Freq: Every day | ORAL | Status: DC
  Administered 2020-04-27: 6 mg via ORAL
  Filled 2020-04-27: qty 2

## 2020-04-27 MED ORDER — PANTOPRAZOLE SODIUM 40 MG PO TBEC
40.0000 mg | DELAYED_RELEASE_TABLET | Freq: Every day | ORAL | Status: DC
  Administered 2020-04-27 – 2020-04-28 (×2): 40 mg via ORAL
  Filled 2020-04-27 (×2): qty 1

## 2020-04-27 MED ORDER — TAMSULOSIN HCL 0.4 MG PO CAPS
0.4000 mg | ORAL_CAPSULE | Freq: Every day | ORAL | Status: DC
  Administered 2020-04-27 – 2020-04-28 (×2): 0.4 mg via ORAL
  Filled 2020-04-27 (×2): qty 1

## 2020-04-27 MED ORDER — FERROUS SULFATE 325 (65 FE) MG PO TABS
325.0000 mg | ORAL_TABLET | Freq: Every day | ORAL | Status: DC
  Administered 2020-04-27 – 2020-04-28 (×2): 325 mg via ORAL
  Filled 2020-04-27 (×2): qty 1

## 2020-04-27 MED ORDER — CLOPIDOGREL BISULFATE 75 MG PO TABS
75.0000 mg | ORAL_TABLET | Freq: Every day | ORAL | Status: DC
  Administered 2020-04-27 – 2020-04-28 (×2): 75 mg via ORAL
  Filled 2020-04-27 (×2): qty 1

## 2020-04-27 MED ORDER — POLYETHYLENE GLYCOL 3350 17 G PO PACK
17.0000 g | PACK | Freq: Every day | ORAL | Status: DC
  Administered 2020-04-27 – 2020-04-28 (×2): 17 g via ORAL
  Filled 2020-04-27 (×2): qty 1

## 2020-04-27 MED ORDER — ISOSORBIDE MONONITRATE ER 60 MG PO TB24
120.0000 mg | ORAL_TABLET | Freq: Every day | ORAL | Status: DC
  Administered 2020-04-27 – 2020-04-28 (×2): 120 mg via ORAL
  Filled 2020-04-27 (×2): qty 2

## 2020-04-27 MED ORDER — PREGABALIN 75 MG PO CAPS
75.0000 mg | ORAL_CAPSULE | Freq: Every day | ORAL | Status: DC
  Administered 2020-04-27 – 2020-04-28 (×2): 75 mg via ORAL
  Filled 2020-04-27 (×2): qty 1

## 2020-04-27 MED ORDER — INSULIN ASPART 100 UNIT/ML ~~LOC~~ SOLN
0.0000 [IU] | Freq: Three times a day (TID) | SUBCUTANEOUS | Status: DC
  Administered 2020-04-27: 4 [IU] via SUBCUTANEOUS
  Administered 2020-04-27: 3 [IU] via SUBCUTANEOUS
  Administered 2020-04-27 – 2020-04-28 (×2): 7 [IU] via SUBCUTANEOUS
  Administered 2020-04-28: 4 [IU] via SUBCUTANEOUS

## 2020-04-27 MED ORDER — LIDOCAINE 5 % EX PTCH
2.0000 | MEDICATED_PATCH | Freq: Every day | CUTANEOUS | Status: DC
  Administered 2020-04-27 – 2020-04-28 (×2): 2 via TRANSDERMAL
  Filled 2020-04-27 (×2): qty 2

## 2020-04-27 MED ORDER — INSULIN ASPART 100 UNIT/ML ~~LOC~~ SOLN
12.0000 [IU] | Freq: Once | SUBCUTANEOUS | Status: AC
  Administered 2020-04-27: 12 [IU] via SUBCUTANEOUS
  Filled 2020-04-27: qty 1

## 2020-04-27 NOTE — ED Notes (Signed)
PT CBG is 88 RN is aware

## 2020-04-27 NOTE — H&P (Signed)
4        History and Physical    Cory Weber HEN:[id number] DOB: [birth year] DOA: 04/26/2020  PCP: Iona Beard, MD  Patient coming from: Nursing facility.  I have personally briefly reviewed patient's old medical records in [place]  Chief Complaint: Shortness of breath.  HPI: Cory Weber is a 70 y.o. male with medical history significant of acute blood loss anemia, asthma, BPH, chronic combined systolic and diastolic HF, stage IIIb CKD, other nonhemorrhagic CVA, GERD, hepatitis C, hyperlipidemia, hypertension, PVD, ischemia and osteomyelitis of foot per nursing record, wet gangrene, peripheral neuropathy, history of unspecified pneumonia, type II DM who was discharged on 04/08/2020 from [facility] after an 19-day hospitalization secondary to NSTEMI associated with acute systolic/diastolic HF and complicated by iron deficiency anemia secondary to 3 AVM which were treated with a PRBC transfusion along with argon coagulation of these lesions, who is sent from his nursing facility to the emergency department due to dyspnea since earlier in the evening associated with cough with occasional production of yellowish sputum, palpitations and fatigue.  EMS described that the patient was hypoxic at 75% when they arrived to the facility.  He was placed on NRB oxygen and improved.  His oxygen requirement subsequently improved, now he is currently on nasal cannula oxygen at 4 LPM.  He denies fever, chills, wheezing or hemoptysis.  ED Course: Initial vital signs were temperature 98.1 F, pulse 92, respiration 20, BP 147/81 mmHg and O2 sat 92% on NRB oxygen.  The patient received 80 mg of furosemide IVP in the ED.  Labwork: CBC showed a white count of 6.2, hemoglobin 7.5 g/dL and platelets 290.  His most recent hemoglobin levels were obtained daily from 04/05/2020 until 04/08/2020 and the values were 8.1, then 7.2, 10 7.9 and 9.0 g/dL. Lactic acid was normal.  BNP was 352.0 pg/mL.  Troponin was 30 and then  103 ng/L.  Sodium 134, potassium 4.1, chloride 94 and CO2 33 mmol/L.  Glucose 340, BUN 24, creatinine 1.91 mg/dL.  Total protein is 9.9 and albumin 3.0 g/dL.  The rest of the hepatic functions were unremarkable.  Imaging: A one-view portable chest radiograph had findings consistent with mild CHF.  Please see image and full radiology report for further detail.  Review of Systems: As per HPI otherwise all other systems reviewed and are negative.  Past Medical History:  Diagnosis Date  . Anemia   . Asthma   . BPH (benign prostatic hyperplasia)   . Cellulitis and abscess of foot 07/2015  . Chronic diastolic heart failure ([place])    per Nursing facility  . Chronic kidney disease    CKD stage 3  . CVA (cerebral vascular accident) ([place]) 2015   denies residual on 08/15/2015  . GERD (gastroesophageal reflux disease)   . Hepatitis C    states he was diagnosed in 2007 or 2007 while living in [place], [place]  . Hepatitis C   . Hyperlipidemia   . Hypertension   . Ischemia of foot 05/31/2016  . Osteomyelitis ([place])    per Nursing Facility  . Peripheral neuropathy   . Pneumonia X 1  . PVD (peripheral vascular disease) ([place])   . Type II diabetes mellitus ([place])   . Wet gangrene ([place]) 06/10/2016    Past Surgical History:  Procedure Laterality Date  . ABDOMINAL AORTOGRAM N/A 06/03/2016   Procedure: Abdominal Aortogram;  Surgeon: Waynetta Sandy, MD;  Location: [place] [facility];  Service: Cardiovascular;  Laterality: N/A;  .  AMPUTATION Left 08/16/2015   Procedure: LEFT BELOW THE KNEE AMPUTATION ;  Surgeon: Serafina Mitchell, MD;  Location: [place];  Service: Vascular;  Laterality: Left;  . AMPUTATION Right 08/22/2016   Procedure: AMPUTATION BELOW KNEE-RIGHT;  Surgeon: Waynetta Sandy, MD;  Location: [place];  Service: Vascular;  Laterality: Right;  . BACK SURGERY    . BELOW KNEE LEG AMPUTATION Left 08/16/2015  . BIOPSY N/A 09/03/2012   Procedure: BIOPSY;  Surgeon: Daneil Dolin, MD;   [facility];  Service: Endoscopy;  Laterality: N/A;  gastric and gastric mucosa  . BIOPSY N/A 12/03/2012   Procedure: BIOPSY;  Surgeon: Daneil Dolin, MD;  [facility]: [facility];  Service: Endoscopy;  Laterality: N/A;  . BIOPSY  03/31/2020   Procedure: BIOPSY;  Surgeon: Ladene Artist, MD;  Location: [place];  Service: Endoscopy;;  . COLONOSCOPY WITH PROPOFOL N/A 09/03/2012   BZJ:IRCVELF polyp-removed as outlined above. Prominent internal hemorrhoids. Tubular adenoma  . COLONOSCOPY WITH PROPOFOL N/A 04/05/2020   Procedure: COLONOSCOPY WITH PROPOFOL;  Surgeon: Lavena Bullion, DO;  Location: [place];  Service: Gastroenterology;  Laterality: N/A;  . ENTEROSCOPY Left 04/05/2020   Procedure: ENTEROSCOPY;  Surgeon: Lavena Bullion, DO;  Location: [place];  Service: Gastroenterology;  Laterality: Left;  . ENTEROSCOPY N/A 04/07/2020   Procedure: ENTEROSCOPY;  Surgeon: Lavena Bullion, DO;  Location: [place];  Service: Gastroenterology;  Laterality: N/A;  . ESOPHAGOGASTRODUODENOSCOPY (EGD) WITH PROPOFOL N/A 09/03/2012   YBO:[id number] hernia. Gastric diverticulum. Gastric ulcers with associated erosions. Duodenal erosions. Status post gastric biopsy. H.PYLORI gastritis   . ESOPHAGOGASTRODUODENOSCOPY (EGD) WITH PROPOFOL N/A 12/03/2012   Dr. Gala Romney: gastric diverticulum, gastric erosions and scar. Previously noted gastric ulcer completed healed. Biopsy without H.pylori.   . ESOPHAGOGASTRODUODENOSCOPY (EGD) WITH PROPOFOL N/A 01/23/2016   Procedure: ESOPHAGOGASTRODUODENOSCOPY (EGD) WITH PROPOFOL;  Surgeon: Doran Stabler, MD;  Location: [facility];  Service: Gastroenterology;  Laterality: N/A;  . ESOPHAGOGASTRODUODENOSCOPY (EGD) WITH PROPOFOL N/A 03/31/2020   Procedure: ESOPHAGOGASTRODUODENOSCOPY (EGD) WITH PROPOFOL;  Surgeon: Ladene Artist, MD;  Location: [facility];  Service: Endoscopy;  Laterality: N/A;  . GIVENS CAPSULE STUDY N/A 04/05/2020   Procedure: GIVENS CAPSULE  STUDY;  Surgeon: Lavena Bullion, DO;  Location: [place];  Service: Gastroenterology;  Laterality: N/A;  . HOT HEMOSTASIS N/A 04/07/2020   Procedure: HOT HEMOSTASIS (ARGON PLASMA COAGULATION/BICAP);  Surgeon: Lavena Bullion, DO;  Location: [facility];  Service: Gastroenterology;  Laterality: N/A;  . I & D EXTREMITY Right 07/11/2016   Procedure: DELAY PRIMARY CLOSURE FOOT AMPUTATION;  Surgeon: Edrick Kins, DPM;  Location: [place];  Service: Podiatry;  Laterality: Right;  . LEFT HEART CATH AND CORONARY ANGIOGRAPHY N/A 03/21/2020   Procedure: LEFT HEART CATH AND CORONARY ANGIOGRAPHY;  Surgeon: Martinique, Peter M, MD;  Location: [place] [facility];  Service: Cardiovascular;  Laterality: N/A;  . LIVER BIOPSY  2005   Done in [place], [place]. Chronic hepatitis with mild periportal inflammation, lobular unicellular necrosis and portal fibrosis. Grade 2, stage 1-2.  . LOWER EXTREMITY ANGIOGRAPHY Right 06/03/2016   Procedure: Lower Extremity Angiography;  Surgeon: Waynetta Sandy, MD;  Location: [place] [facility];  Service: Cardiovascular;  Laterality: Right;  . MAXIMUM ACCESS (MAS)POSTERIOR LUMBAR INTERBODY FUSION (PLIF) 1 LEVEL N/A 11/25/2013   Procedure: FOR MAXIMUM ACCESS (MAS) POSTERIOR LUMBAR INTERBODY FUSION (PLIF) 1 LEVEL;  Surgeon: Eustace Moore, MD;  Location: [place] [facility];  Service: Neurosurgery;  Laterality: N/A;  FOR MAXIMUM ACCESS (  MAS) POSTERIOR LUMBAR INTERBODY FUSION (PLIF) 1 LEVEL LUMBAR 3-4  . PERIPHERAL VASCULAR ATHERECTOMY Right 06/03/2016   Procedure: Peripheral Vascular Atherectomy;  Surgeon: Waynetta Sandy, MD;  Location: [place] [facility];  Service: Cardiovascular;  Laterality: Right;  SFA WITH STENT  . PERIPHERAL VASCULAR CATHETERIZATION Left 08/01/2015   Procedure: Lower Extremity Angiography;  Surgeon: Serafina Mitchell, MD;  Location: [place] [facility];  Service: Cardiovascular;  Laterality: Left;  . PERIPHERAL VASCULAR CATHETERIZATION N/A 08/01/2015    Procedure: Abdominal Aortogram;  Surgeon: Serafina Mitchell, MD;  Location: [place] [facility];  Service: Cardiovascular;  Laterality: N/A;  . PERIPHERAL VASCULAR CATHETERIZATION N/A 08/08/2015   Procedure: Abdominal Aortogram w/Lower Extremity;  Surgeon: Serafina Mitchell, MD;  Location: [place] [facility];  Service: Cardiovascular;  Laterality: N/A;  . PERIPHERAL VASCULAR CATHETERIZATION Left 08/08/2015   Procedure: Peripheral Vascular Balloon Angioplasty;  Surgeon: Serafina Mitchell, MD;  Location: [place] [facility];  Service: Cardiovascular;  Laterality: Left;  left popiteal artery, left peronealtrunk, left post tibial  . POLYPECTOMY N/A 09/03/2012   Procedure: POLYPECTOMY;  Surgeon: Daneil Dolin, MD;  [facility];  Service: Endoscopy;  Laterality: N/A;  cecal polyp  . SUBMUCOSAL TATTOO INJECTION  04/07/2020   Procedure: SUBMUCOSAL TATTOO INJECTION;  Surgeon: Lavena Bullion, DO;  Location: [facility];  Service: Gastroenterology;;  . TONSILLECTOMY    . TRANSMETATARSAL AMPUTATION Right 06/13/2016   Procedure: RIGHT TRANSMETATARSAL AMPUTATION;  Surgeon: Waynetta Sandy, MD;  Location: [place];  Service: Vascular;  Laterality: Right;  . TRANSMETATARSAL AMPUTATION Right 07/10/2016   Procedure: REVISION RIGHT TRANSMETATARSAL AMPUTATION;  Surgeon: Waynetta Sandy, MD;  Location: [place];  Service: Vascular;  Laterality: Right;    Social History  reports that he quit smoking about 4 years ago. His smoking use included cigarettes. He has a 47.00 pack-year smoking history. He has never used smokeless tobacco. He reports that he does not drink alcohol and does not use drugs.  Allergies  Allergen Reactions  . No Known Allergies     Family History  Problem Relation Age of Onset  . Breast cancer Mother   . Heart failure Mother   . Diabetes Father   . Hypertension Father   . Heart disease Father   . Hyperlipidemia Father   . Diabetes Sister   . Hypertension Sister   . Breast  cancer Sister   . Colon cancer Neg Hx   . Liver disease Neg Hx    Prior to Admission medications   Medication Sig Start Date End Date Taking? Authorizing Provider  acetaminophen (TYLENOL) 325 MG tablet Take 2 tablets (650 mg total) by mouth every 6 (six) hours as needed for mild pain or headache (fever >/= 101). 02/24/19   Roxan Hockey, MD  amLODipine (NORVASC) 10 MG tablet Take 1 tablet (10 mg total) by mouth daily. 04/08/20   Regalado, Jerald Kief A, MD  aspirin EC 81 MG tablet Take 1 tablet (81 mg total) by mouth daily with breakfast. Patient not taking: Reported on 03/20/2020 02/24/19   Roxan Hockey, MD  baclofen (LIORESAL) 10 MG tablet Take 1 tablet (10 mg total) by mouth 3 (three) times daily as needed for muscle spasms. Patient taking differently: Take 10 mg by mouth 2 (two) times daily. For shoulder spasm 02/25/19   Roxan Hockey, MD  carvedilol (COREG) 25 MG tablet Take 1 tablet (25 mg total) by mouth 2 (two) times daily with a meal. 04/08/20   Regalado, [employer]  A, MD  clopidogrel (PLAVIX) 75 MG tablet Take 1 tablet (75 mg total) by mouth daily with breakfast. 04/09/20   Regalado, Belkys A, MD  cyanocobalamin 1000 MCG tablet Take 500 mcg by mouth daily.     [provider]  DULoxetine (CYMBALTA) 30 MG capsule Take 60 mg by mouth daily. 10/20/19   [provider]  ferrous sulfate 325 (65 FE) MG tablet Take 1 tablet (325 mg total) by mouth daily with breakfast. 04/08/20   Regalado, Belkys A, MD  fluconazole (DIFLUCAN) 200 MG tablet Take 1 tablet (200 mg total) by mouth daily for 19 days. 04/08/20 04/27/20  Regalado, Belkys A, MD  furosemide (LASIX) 80 MG tablet Take 1 tablet (80 mg total) by mouth daily. 04/08/20   Regalado, Belkys A, MD  hydrALAZINE (APRESOLINE) 50 MG tablet Take 1 tablet (50 mg total) by mouth every 8 (eight) hours. 04/08/20   Regalado, Belkys A, MD  insulin aspart (NOVOLOG) 100 UNIT/ML injection Inject 5 Units into the skin 3 (three) times daily after  meals. Patient not taking: Reported on 03/20/2020 07/15/16   Domenic Polite, MD  insulin glargine (LANTUS) 100 UNIT/ML injection Inject 0.18 mLs (18 Units total) into the skin at bedtime. 04/08/20   Regalado, Belkys A, MD  insulin lispro (HUMALOG) 100 UNIT/ML injection Inject 0.04 mLs (4 Units total) into the skin 3 (three) times daily before meals. 04/08/20   Regalado, Belkys A, MD  ipratropium-albuterol (DUONEB) 0.5-2.5 (3) MG/3ML SOLN Take 3 mLs by nebulization every 6 (six) hours as needed (SOB/ wheezing).    [provider]  isosorbide mononitrate (IMDUR) 120 MG 24 hr tablet Take 1 tablet (120 mg total) by mouth daily. 04/08/20   Regalado, Belkys A, MD  Lidocaine 4 % PTCH Apply 1 patch topically daily. Apply 1 patch to both stumps once daily    [provider]  Melatonin 5 MG TABS Take 1 tablet by mouth at bedtime.    [provider]  pantoprazole (PROTONIX) 40 MG tablet Take 1 tablet (40 mg total) by mouth daily. 04/08/20   Regalado, Belkys A, MD  polyethylene glycol (MIRALAX / GLYCOLAX) 17 g packet Take 17 g by mouth daily.    [provider]  pregabalin (LYRICA) 75 MG capsule Take 1 capsule (75 mg total) by mouth daily. 04/08/20   Regalado, Belkys A, MD  rosuvastatin (CRESTOR) 20 MG tablet Take 1 tablet (20 mg total) by mouth daily. 04/08/20   Regalado, Belkys A, MD  tamsulosin (FLOMAX) 0.4 MG CAPS capsule Take 1 capsule (0.4 mg total) by mouth daily. Patient not taking: Reported on 03/20/2020 08/10/15   Cristal Ford, DO  vitamin C (ASCORBIC ACID) 500 MG tablet Take 500 mg by mouth 2 (two) times daily.    [provider]    Physical Exam: Vitals:   04/27/20 0200 04/27/20 0300 04/27/20 0400 04/27/20 0443  BP: 130/69 (!) 142/71 136/71 (!) 150/76  Pulse: 86 84 80 80  Resp: 15 16 15 18   Temp:    98.2 F (36.8 C)  TempSrc:    Oral  SpO2: 100% 98% 99% 100%  Weight:    118.5 kg  Height:        Constitutional: NAD, calm, comfortable Eyes: PERRL,  lids and conjunctivae normal ENMT: [place] in place.  Mucous membranes are moist. Posterior pharynx clear of any exudate or lesions. Neck: normal, supple, no masses, no thyromegaly Respiratory: Decreased breath sounds in bases with bibasilar rales and rhonchi bilaterally, no wheezing.. Normal  respiratory effort. No accessory muscle use.  Cardiovascular: Regular rate and rhythm, no murmurs / rubs / gallops.  No pitting edema. 2+ pedal pulses. No carotid bruits.  Abdomen: Obese, no distention.  Bowel sounds positive.  Soft, no tenderness, no masses palpated. No hepatosplenomegaly. Bowel sounds positive.  Musculoskeletal: Bilateral BKA.  No clubbing / cyanosis. Good ROM, no contractures. Normal muscle tone.  Skin: no rashes, lesions, ulcers on limited dermatological examination. Neurologic: CN 2-12 grossly intact. Sensation intact, DTR normal. Strength 5/5 in all 4.  Psychiatric: Normal judgment and insight. Alert and oriented x 3. Normal mood.   Labs on Admission: I have personally reviewed following labs and imaging studies  CBC: Recent Labs  Lab 04/27/20 0009  WBC 6.2  NEUTROABS 4.8  HGB 7.5*  HCT 24.2*  MCV 99.2  PLT 737    Basic Metabolic Panel: Recent Labs  Lab 04/27/20 0009  NA 134*  K 4.1  CL 94*  CO2 33*  GLUCOSE 340*  BUN 24*  CREATININE 1.91*  CALCIUM 8.4*    GFR: Estimated Creatinine Clearance: 49.9 mL/min (A) (by C-G formula based on SCr of 1.91 mg/dL (H)).  Liver Function Tests: Recent Labs  Lab 04/27/20 0009  AST 12*  ALT 11  ALKPHOS 74  BILITOT 0.3  PROT 9.9*  ALBUMIN 3.0*   Radiological Exams on Admission: DG Chest Port 1 View  Result Date: 04/26/2020 CLINICAL DATA:  Shortness of breath, chest pain EXAM: PORTABLE CHEST 1 VIEW COMPARISON:  04/01/2020 FINDINGS: Single frontal view of the chest demonstrates a stable cardiac silhouette. There is central vascular congestion, with mild bilateral perihilar airspace disease greatest in the infrahilar regions.  No effusion or pneumothorax. IMPRESSION: 1. Findings consistent with mild congestive heart failure. Electronically Signed   By: Randa Ngo M.D.   On: 04/26/2020 23:34   03/20/2020 echo complete was imaging enhancing agent.  IMPRESSIONS: 1. Apical hypokinesis. . Left ventricular ejection fraction, by  estimation, is 40 to 45%. The left ventricle has mildly decreased  function. The left ventricle demonstrates regional wall motion  abnormalities (see scoring diagram/findings for  description). There is mild left ventricular hypertrophy. Left ventricular  diastolic parameters are indeterminate.  2. Right ventricular systolic function is normal. The right ventricular  size is normal.  3. Left atrial size was mildly dilated.  4. The mitral valve is normal in structure. No evidence of mitral valve  regurgitation. No evidence of mitral stenosis.  5. The aortic valve has an indeterminant number of cusps. There is mild  calcification of the aortic valve. There is mild thickening of the aortic  valve. Aortic valve regurgitation is not visualized. No aortic stenosis is  present.  6. The inferior vena cava is normal in size with greater than 50%  respiratory variability, suggesting right atrial pressure of 3 mmHg.   03/21/2020 left heart cath and coronary angiography. Conclusion    Mid LM to Dist LM lesion is 70% stenosed.  Ost LAD to Prox LAD lesion is 70% stenosed.  Ramus lesion is 70% stenosed.  Ost Cx to Mid Cx lesion is 99% stenosed.  Prox RCA lesion is 40% stenosed.  There is moderate left ventricular systolic dysfunction.  LV end diastolic pressure is severely elevated.  The left ventricular ejection fraction is 35-45% by visual estimate.   1. Severe complex CAD. There is a tapering 70% stenosis in the distal Left main involving as well the ostium of the LAD and ramus intermediate with hazy appearance. The LCx is  a co-dominant vessel with extensive dissection  involving the ostium and proximal vessel and is is subtotally occluded. 2. Moderate LV dysfunction. EF 40-45% 3. Severely elevated LVEDP 30 mm Hg.  Plan: patient is not a candidate for PCI. He is likely not a candidate for CABG as well given multiple co-morbidities. Will discuss with rounding team. Likely manage medically. Prognosis is very poor.   EKG: Independently reviewed.  Vent. rate 95 BPM PR interval * ms QRS duration 94 ms QT/QTc 387/487 ms P-R-T axes 51 81 11 Sinus rhythm Borderline right axis deviation Abnormal inferior Q waves Borderline repolarization abnormality Borderline prolonged QT interval  Assessment/Plan Principal Problem:   Acute respiratory failure with hypoxia (HCC) secondary to   Acute on chronic combined systolic and diastolic congestive heart failure (HCC) Observation/telemetry. Continue supplemental oxygen. Fluid and sodium restriction. Monitor daily weights, intake and output. Continue furosemide 40 mg p.o. twice daily. Hold carvedilol 25 mg p.o. twice daily. Continue hydralazine 50 mg every 8 hr p.o. daily. Continue Imdur 120 mg p.o. daily.  Active Problems:   Chronic kidney disease, stage 3b (HCC) Currently on IV furosemide. Monitor GFR closely. Monitor and replace electrolytes as needed.    Essential hypertension Hold carvedilol 25 mg p.o. daily. Continue amlodipine 10 mg p.o. daily. Monitor blood pressure and heart rate.    BPH (benign prostatic hyperplasia) Continue tamsulosin 0.4 mg p.o. daily.    GERD (gastroesophageal reflux disease) Continue pantoprazole 40 mg p.o. daily.    Uncontrolled type II diabetes with peripheral autonomic neuropathy (HCC) Carbohydrate modified diet. Continue Lantus 18 units SQ at bedtime. CBG monitoring with RI SS. Continue duloxetine 30 mg QD for neuropathy. Lidocaine patches as needed.    Dyslipidemia associated with type 2 diabetes mellitus (HCC) Continue rosuvastatin 20 mg p.o. daily.     Prolonged QT interval Avoid QT interval prolonging meds. Magnesium sulfate administered earlier.    DVT prophylaxis: SCDs. Code Status:   Full code. Family Communication:   Disposition Plan:   Patient is from:             SNF.  Anticipated DC to:  SNF.  Anticipated DC date:  04/28/2020.  Anticipated DC barriers: Clinical status.  Consults called: Admission status:  Observation/telemetry.  High due to presenting with  Severity of Illness:  Reubin Milan MD [facility]  How to contact the [facility] Attending or Consulting provider [place] or covering provider during after hours [place], for this patient?   1. Check the care team in [facility] and look for a) attending/consulting [facility] provider listed and b) the [facility] team listed 2. Log into [email or web address] and use 's universal password to access. If you do not have the password, please contact the hospital operator. 3. Locate the [facility] provider you are looking for under [facility] and page to a number that you can be directly reached. 4. If you still have difficulty reaching the provider, please page the [facility] (Director on Call) for the Hospitalists listed on amion for assistance.  04/27/2020, 5:39 AM   This document was prepared using Dragon voice recognition software and may contain some unintended transcription errors.

## 2020-04-27 NOTE — Progress Notes (Signed)
Patient seen and examined. Admitted after midnight secondary to SOB and component of acute on chronic resp failure with hypoxia secondary to CHF exacerbation (acute on chronic combined HF). Patient chronically uses 2-3L [place] supplementation and expressed some problems with his supplementation and when his supplementation supply resolved, he continue to be SOB and was brought to [facility] for evaluation. Patient found with elevated BNP and vascular congestion on CXR. Patient denies CP and expressed improvement in his symptoms after receiving diuresis. Please refer to H7P written by Dr. Olevia Bowens for further admission details.  Plan: -continue to follow low sodium diet -check daily weights and follow strict I's and O's -wean off oxygen supplementation back to baseline as tolerated -follow clinical response -hopefully back to [facility] in am   Barton Dubois MD [phone]

## 2020-04-27 NOTE — Plan of Care (Signed)

## 2020-04-27 NOTE — Progress Notes (Signed)
Initial Nutrition Assessment  DOCUMENTATION CODES:   Obesity unspecified  INTERVENTION:   -30 ml Prosource Plus BID, each supplement provides 100 kcals and 15 grams protein -Ensure Enlive po daily, each supplement provides 350 kcal and 20 grams of protein -MVI with minerals daily  NUTRITION DIAGNOSIS:   Increased nutrient needs related to chronic illness (CHF) as evidenced by estimated needs.  GOAL:   Patient will meet greater than or equal to 90% of their needs  MONITOR:   PO intake,Supplement acceptance,Labs,Weight trends,Skin,I & O's  REASON FOR ASSESSMENT:   Malnutrition Screening Tool    ASSESSMENT:   Cory Weber is a 70 y.o. male with medical history significant of acute blood loss anemia, asthma, BPH, chronic combined systolic and diastolic HF, stage IIIb CKD, other nonhemorrhagic CVA, GERD, hepatitis C, hyperlipidemia, hypertension, PVD, ischemia and osteomyelitis of foot per nursing record, wet gangrene, peripheral neuropathy, history of unspecified pneumonia, type II DM who was discharged on 04/08/2020 from [facility] after an 19-day hospitalization secondary to NSTEMI associated with acute systolic/diastolic HF and complicated by iron deficiency anemia secondary to 3 AVM which were treated with a PRBC transfusion along with argon coagulation of these lesions, who is sent from his nursing facility to the emergency department due to dyspnea since earlier in the evening associated with cough with occasional production of yellowish sputum, palpitations and fatigue.  EMS described that the patient was hypoxic at 75% when they arrived to the facility.  He was placed on NRB oxygen and improved.  His oxygen requirement subsequently improved, now he is currently on nasal cannula oxygen at 4 LPM.  He denies fever, chills, wheezing or hemoptysis.  Pt admitted with respiratory failure secondary to CHF exacerbation.  Reviewed I/O's: +37 ml x 24 hours  Spoke with pt at bedside, who was  sleepy during visit. He reports feeling better and has a good appetite. He consumed only the sausahe off his breakfast tray, but reports "I will work on the rest later". He shares that he typically consumes 2 meals per day (Breakfast: eggs, toast, and sausage and Dinner: meat, starch, and vegetable). He denies any changes in his eating patterns.   Pt denies any weight loss. Reviewed wt hx; wt has been stable over the past 6 months.   Discussed importance of good meal and supplement intake to promote healing.   Noted bilateral BKA on exam.   Medications reviewed and include vitamin C, ferrous sulfate, lasix, melatonin, miralax, vitamin B-12.   Lab Results  Component Value Date   HGBA1C 6.6 (H) 03/20/2020   PTA DM medications are 18 units insulin glargine daily and 4 units insulin lispro TID.   Labs reviewed: CBGS: [id number] (inpatient orders for glycemic control are 0-20 units insulin aspart TID and 18 units insulin glargine daily).   NUTRITION - FOCUSED PHYSICAL EXAM:  Flowsheet Row Most Recent Value  Orbital Region No depletion  Upper Arm Region No depletion  Thoracic and Lumbar Region No depletion  Buccal Region No depletion  Temple Region No depletion  Clavicle Bone Region No depletion  Clavicle and Acromion Bone Region No depletion  Scapular Bone Region No depletion  Dorsal Hand No depletion  Patellar Region No depletion  Anterior Thigh Region No depletion  Posterior Calf Region Unable to assess  Edema (RD Assessment) Mild  Hair Reviewed  Eyes Reviewed  Mouth Reviewed  Skin Reviewed  Nails Reviewed       Diet Order:   Diet Order  Diet heart healthy/carb modified Room service appropriate? Yes; Fluid consistency: Thin  Diet effective now                 EDUCATION NEEDS:   Education needs have been addressed  Skin:  Skin Assessment: Reviewed RN Assessment  Last BM:  04/24/20  Height:   Ht Readings from Last 1 Encounters:  04/26/20 6' (1.829 m)     Weight:   Wt Readings from Last 1 Encounters:  04/27/20 118.5 kg    Ideal Body Weight:  70.4 kg (adjusted for rt BKA)  BMI:  Body mass index is 35.43 kg/m.  Estimated Nutritional Needs:   Kcal:  4270-6237  Protein:  90-105 grams  Fluid:  > 1.7 L    Loistine Chance, RD, LDN, [place] Registered Dietitian II Certified Diabetes Care and Education Specialist Please refer to [facility] for RD and/or RD on-call/weekend/after hours pager

## 2020-04-27 NOTE — TOC Initial Note (Signed)
Transition of Care [facility]) - Initial/Assessment Note    Patient Details  Name: Cory Weber MRN: [id number] Date of Birth: 1950-12-21  [facility] CM/SW Contact:    Natasha Bence, LCSW Phone Number: 04/27/2020, 11:39 AM  Clinical Narrative:                 Patient is a 70 year old male admitted for Acute on chronic combined systolic and diastolic congestive heart failure ([place]). CSW notified of CHF consult and that patient is a long term resident with [facility]. CSW contacted Debbie with [facility] to conduct CHF consult. Debbie reported that after patient's last hospital stay, he returned with the CHF diagnosis and O2 needs. Debbie reported that they have been following recommendations for CHF such as following a heart healthy diet, monitoring fluid intake, and limiting fluid intake. Debbie agreeable to take patient back on 04/28/2020. TOC to follow.   Expected Discharge Plan: Long Term Nursing Home Barriers to Discharge: Continued Medical Work up   Patient Goals and CMS Choice Patient states their goals for this hospitalization and ongoing recovery are:: Return to long term care with [facility] Medicare.gov Compare Post Acute Care list provided to:: Patient Represenative (must comment) ([place] [facility]) Choice offered to / list presented to : Patient  Expected Discharge Plan and Services Expected Discharge Plan: [place] In-house Referral: NA Discharge Planning Services: NA Post Acute Care Choice: NA Living arrangements for the past 2 months: [place]                 DME Arranged: N/A DME Agency: NA       HH Arranged: NA [place] Agency: NA        Prior Living Arrangements/Services Living arrangements for the past 2 months: [place] Lives with:: Facility Resident Patient language and need for interpreter reviewed:: Yes Do you feel safe going back to the place where you live?: Yes      Need for Family Participation  in Patient Care: Yes (Comment) Care giver support system in place?: Yes (comment)   Criminal Activity/Legal Involvement Pertinent to Current Situation/Hospitalization: No - Comment as needed  Activities of Daily Living Home Assistive Devices/Equipment: CPAP,Shower chair with back,Oxygen ADL Screening (condition at time of admission) Patient's cognitive ability adequate to safely complete daily activities?: Yes Is the patient deaf or have difficulty hearing?: No Does the patient have difficulty seeing, even when wearing glasses/contacts?: No Does the patient have difficulty concentrating, remembering, or making decisions?: No Patient able to express need for assistance with ADLs?: Yes Does the patient have difficulty dressing or bathing?: Yes Independently performs ADLs?: No Communication: Independent Dressing (OT): Independent Is this a change from baseline?: Pre-admission baseline Grooming: Needs assistance Is this a change from baseline?: Pre-admission baseline Feeding: Independent Is this a change from baseline?: Pre-admission baseline Bathing: Needs assistance Is this a change from baseline?: Pre-admission baseline Toileting: Needs assistance Is this a change from baseline?: Pre-admission baseline In/Out Bed: Needs assistance Is this a change from baseline?: Pre-admission baseline Walks in Home: Dependent Is this a change from baseline?: Pre-admission baseline Does the patient have difficulty walking or climbing stairs?: Yes Weakness of Legs: Both Weakness of Arms/Hands: Both  Permission Sought/Granted Permission sought to share information with : Facility Art therapist granted to share information with : Yes, Verbal Permission Granted     Permission granted to share info w AGENCY: [facility]        Emotional Assessment  Orientation: : Oriented to Self,Oriented to Situation,Oriented to Place,Oriented to  Time Alcohol / Substance Use: Not  Applicable Psych Involvement: No (comment)  Admission diagnosis:  Acute on chronic combined systolic and diastolic congestive heart failure (HCC) [I50.43] Acute on chronic congestive heart failure, unspecified heart failure type [facility]) [I50.9] Patient Active Problem List   Diagnosis Date Noted  . Acute on chronic combined systolic and diastolic congestive heart failure ([place]) 04/27/2020  . Prolonged QT interval 04/27/2020  . AVM (arteriovenous malformation) of small bowel, acquired   . Gastritis and gastroduodenitis   . Gastric diverticulum   . Grade I internal hemorrhoids   . Occult blood in stools   . Heart failure ([place]) 03/20/2020  . NSTEMI (non-ST elevated myocardial infarction) ([place]) 03/20/2020  . Pneumonia due to COVID-19 virus 02/24/2019  . Acute respiratory failure with hypoxia ([place]) 02/20/2019  . COVID-19 virus detected 02/20/2019  . UTI (urinary tract infection) 02/20/2019  . DNR (do not resuscitate) 02/20/2019  . PAD (peripheral artery disease) ([place]) 08/22/2016  . Osteomyelitis of foot ([place]) 07/08/2016  . S/P transmetatarsal amputation of foot, right ([place]) 06/26/2016  . Constipation due to opioid therapy 06/15/2016  . Physical deconditioning 06/10/2016  . Wet gangrene ([place]) 06/10/2016  . Anemia, chronic disease 06/10/2016  . Chronic diastolic CHF (congestive heart failure) ([place]) 06/10/2016  . Ischemic pain of right foot 06/04/2016  . Ischemia of foot 05/31/2016  . 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 Dyslipidemia associated with type 2 diabetes mellitus ([place]) 01/21/2016  . Uncontrolled type II diabetes with peripheral autonomic neuropathy ([place]) 01/17/2016  . Weight gain 12/20/2015  . Edema of right lower extremity 12/20/2015  . Cough 11/26/2015  . GERD (gastroesophageal reflux disease) 10/08/2015  . Amputation of left lower extremity below knee ([place]) 08/21/2015  . Phantom limb pain ([place])   . Anemia of chronic  disease   . HLD (hyperlipidemia)   . BPH (benign prostatic hyperplasia)   . ETOH abuse   . PVD (peripheral vascular disease) ([place])   . Chronic hepatitis C without hepatic coma ([place])   . History of CVA (cerebrovascular accident) without residual deficits   . Hyponatremia   . Chronic kidney disease, stage 3 ([place]) 07/18/2015  . Essential hypertension 07/18/2015  . Acute blood loss anemia 12/01/2013  . S/P lumbar spinal fusion 11/25/2013  . Melena 12/03/2011  . Hemiparesthesia 02/03/2011  . Polysubstance abuse ([place]) 02/03/2011  . Tobacco abuse 02/03/2011   PCP:  Iona Beard, MD Pharmacy:   [facility] [place] [address] Phone: [phone] Fax: [phone]     Social Determinants of Health (SDOH) Interventions    Readmission Risk Interventions Readmission Risk Prevention Plan 03/20/2020  Transportation Screening Complete  PCP or Specialist Appt within 3-5 Days Not Complete  Not Complete comments Patient resides at [facility] and the facility schedules hospital follow up appointments.  [place] or Home Care Consult Complete  Social Work Consult for [place] Planning/Counseling Complete  Palliative Care Screening Not Applicable  Medication Review Press photographer) Complete  Some recent data might be hidden

## 2020-04-27 NOTE — ED Notes (Addendum)
Pt maintaining 100% on 10L NRB. O2 changed to 5L Cory Weber. O2 still currently 100%. Will continue to monitor.  Dr. Betsey Holiday made aware.

## 2020-04-27 NOTE — ED Notes (Addendum)
Pt has been maintaining 100% on 15L NRB. O2 was turned down to 10L NRB to see how pt tolerates it. O2 is currently still 100%. Will continue to monitor.

## 2020-04-27 NOTE — ED Notes (Signed)
CRITICAL Troponin 103. Dr. Betsey Holiday made aware.

## 2020-04-27 NOTE — Progress Notes (Addendum)
Date and time results received: 04/27/20  0836   Test: Troponin Critical Value: 427  Name of Provider Notified: Dyann Kief, MD  Orders Received? Or Actions Taken?:   Provider paged via AMION to call nurse for critical value.  Awaiting response.  Dr. Dyann Kief said increase in troponin is likely due to ischemia s/t CHF.  Patient does not exhibit chest pain or signs/symptoms of MI.  No new orders placed.

## 2020-04-28 ENCOUNTER — Observation Stay (HOSPITAL_COMMUNITY): Payer: Medicare (Managed Care)

## 2020-04-28 DIAGNOSIS — N1831 Chronic kidney disease, stage 3a: Secondary | ICD-10-CM

## 2020-04-28 DIAGNOSIS — J9621 Acute and chronic respiratory failure with hypoxia: Secondary | ICD-10-CM

## 2020-04-28 DIAGNOSIS — E6609 Other obesity due to excess calories: Secondary | ICD-10-CM

## 2020-04-28 DIAGNOSIS — K219 Gastro-esophageal reflux disease without esophagitis: Secondary | ICD-10-CM

## 2020-04-28 DIAGNOSIS — Z6835 Body mass index (BMI) 35.0-35.9, adult: Secondary | ICD-10-CM

## 2020-04-28 DIAGNOSIS — E785 Hyperlipidemia, unspecified: Secondary | ICD-10-CM

## 2020-04-28 DIAGNOSIS — I10 Essential (primary) hypertension: Secondary | ICD-10-CM

## 2020-04-28 DIAGNOSIS — I5043 Acute on chronic combined systolic (congestive) and diastolic (congestive) heart failure: Secondary | ICD-10-CM | POA: Diagnosis not present

## 2020-04-28 DIAGNOSIS — E1169 Type 2 diabetes mellitus with other specified complication: Secondary | ICD-10-CM | POA: Diagnosis not present

## 2020-04-28 DIAGNOSIS — E1121 Type 2 diabetes mellitus with diabetic nephropathy: Secondary | ICD-10-CM

## 2020-04-28 LAB — BASIC METABOLIC PANEL
Anion gap: 6 (ref 5–15)
BUN: 25 mg/dL — ABNORMAL HIGH (ref 8–23)
CO2: 35 mmol/L — ABNORMAL HIGH (ref 22–32)
Calcium: 8.7 mg/dL — ABNORMAL LOW (ref 8.9–10.3)
Chloride: 92 mmol/L — ABNORMAL LOW (ref 98–111)
Creatinine, Ser: 1.75 mg/dL — ABNORMAL HIGH (ref 0.61–1.24)
GFR, Estimated: 42 mL/min — ABNORMAL LOW (ref >60)
Glucose, Bld: 200 mg/dL — ABNORMAL HIGH (ref 70–99)
Potassium: 3.9 mmol/L (ref 3.5–5.1)
Sodium: 133 mmol/L — ABNORMAL LOW (ref 135–145)

## 2020-04-28 LAB — CBC
HCT: 24.2 % — ABNORMAL LOW (ref 39.0–52.0)
Hemoglobin: 7.3 g/dL — ABNORMAL LOW (ref 13.0–17.0)
MCH: 29.6 pg (ref 26.0–34.0)
MCHC: 30.2 g/dL (ref 30.0–36.0)
MCV: 98 fL (ref 80.0–100.0)
Platelets: 260 10*3/uL (ref 150–400)
RBC: 2.47 MIL/uL — ABNORMAL LOW (ref 4.22–5.81)
RDW: 16.2 % — ABNORMAL HIGH (ref 11.5–15.5)
WBC: 4.6 10*3/uL (ref 4.0–10.5)
nRBC: 0 % (ref 0.0–0.2)

## 2020-04-28 LAB — GLUCOSE, CAPILLARY
Glucose-Capillary: 189 mg/dL — ABNORMAL HIGH (ref 70–99)
Glucose-Capillary: 229 mg/dL — ABNORMAL HIGH (ref 70–99)

## 2020-04-28 NOTE — Discharge Summary (Signed)
Physician Discharge Summary  Cory Weber YYT:[id number] DOB: 1950/03/03 DOA: 04/26/2020  PCP: Iona Beard, MD  Admit date: 04/26/2020 Discharge date: 04/28/2020  Time spent: 35 minutes  Recommendations for Outpatient Follow-up:  1. Repeat CBC to follow hemoglobin trend 2. Repeat basic metabolic panel to follow renal function and electrolytes stability 3. Continue to closely follow patient's blood 4. Report resident: Diuretic regimen as needed 5. Continue to assess with proper management of low-sodium and low calorie diet.   Discharge Diagnoses:  Principal Problem:   Acute on chronic combined systolic and diastolic congestive heart failure (HCC) Active Problems:   Chronic kidney disease, stage 3 (HCC)   Essential hypertension   BPH (benign prostatic hyperplasia)   GERD (gastroesophageal reflux disease)   Uncontrolled type II diabetes with peripheral autonomic neuropathy (HCC)   Type 2 diabetes with nephropathy (HCC)   Acute on chronic respiratory failure with hypoxia (HCC)   Prolonged QT interval   Class 2 obesity due to excess calories with body mass index (BMI) of 35.0 to 35.9 in adult Anemia of chronic kidney disease  Discharge Condition: Stable and improved.  Discharged home with instruction to follow-up with PCP and cardiology as an outpatient.  CODE STATUS: DNR  Diet recommendation: Calorie diet, will start carbohydrates low-sodium diet (less than 2 g daily).  Filed Weights   04/26/20 2254 04/27/20 0443  Weight: 121.3 kg 118.5 kg    History of present illness:  As per H&P written by Dr. Olevia Bowens on 04/27/2020 Cory Weber is a 70 y.o. male with medical history significant of acute blood loss anemia, asthma, BPH, chronic combined systolic and diastolic HF, stage IIIb CKD, other nonhemorrhagic CVA, GERD, hepatitis C, hyperlipidemia, hypertension, PVD, ischemia and osteomyelitis of foot per nursing record, wet gangrene, peripheral neuropathy, history of unspecified  pneumonia, type II DM who was discharged on 04/08/2020 from [facility] after an 19-day hospitalization secondary to NSTEMI associated with acute systolic/diastolic HF and complicated by iron deficiency anemia secondary to 3 AVM which were treated with a PRBC transfusion along with argon coagulation of these lesions, who is sent from his nursing facility to the emergency department due to dyspnea since earlier in the evening associated with cough with occasional production of yellowish sputum, palpitations and fatigue.  EMS described that the patient was hypoxic at 75% when they arrived to the facility.  He was placed on NRB oxygen and improved.  His oxygen requirement subsequently improved, now he is currently on nasal cannula oxygen at 4 LPM.  He denies fever, chills, wheezing or hemoptysis.  ED Course: Initial vital signs were temperature 98.1 F, pulse 92, respiration 20, BP 147/81 mmHg and O2 sat 92% on NRB oxygen.  The patient received 80 mg of furosemide IVP in the ED.  Labwork: CBC showed a white count of 6.2, hemoglobin 7.5 g/dL and platelets 290.  His most recent hemoglobin levels were obtained daily from 04/05/2020 until 04/08/2020 and the values were 8.1, then 7.2, 10 7.9 and 9.0 g/dL. Lactic acid was normal.  BNP was 352.0 pg/mL.  Troponin was 30 and then 103 ng/L.  Sodium 134, potassium 4.1, chloride 94 and CO2 33 mmol/L.  Glucose 340, BUN 24, creatinine 1.91 mg/dL.  Total protein is 9.9 and albumin 3.0 g/dL.  The rest of the hepatic functions were unremarkable.  Hospital Course:  Acute on chronic respiratory failure with hypoxia secondary to acute on chronic combined systolic and diastolic heart failure -Improved after IV diuresis -Patient back to his chronic oxygen  supplementation of 3-4 L nasal cannula and denies orthopnea, shortness of breath, palpitations, chest pain or any complaints. -Mild troponins in the setting of demand ischemia with CHF exacerbation was appreciated. -Patient has been  educated about importance of low-sodium diet (less than 2 g daily) -Continue to maintain adequate hydration and check daily weights -Discharge on Lasix 80 mg by mouth daily, carvedilol twice a day, hydralazine 3 times a day and daily imdur. -Patient recent echocardiogram in February 2022 in the demonstrating ejection fraction of 40 to 45%.  2D echo was not repeated during this admission.  There was mild left ventricle hypertrophy and moderate left ventricular dysfunction. -Continue outpatient follow-up with cardiology service as previously instructed.  Type 2 diabetes with nephropathy and neuropathy -Resume home hypoglycemic regimen -Advised to follow modified carbohydrate diet -Encourage to be compliant with modified carbohydrate diet. -Continue Lyrica  Chronic kidney disease a stage IIIa -Appears to be stable and at baseline -Continue to closely follow-up renal function trend and stability -Stable electrolytes.  Essential hypertension -Stable and controlled -Continue current antihypertensive agents -Patient advised to follow heart healthy/low-sodium diet.  Class II obesity -Body mass index is 35.43 kg/m. -Low calorie diet and portion control discussed with patient.  Gastroesophageal flux disease -Continue PPI.  Anemia of chronic kidney disease -No overt bleeding -Continue following hemoglobin trend. -Given demonstrated coronary artery disease on recent cath, patient's threshold for transfusion is less than 7.5.  Procedures:  See below for x-ray reports.  Consultations:  None  Discharge Exam: Vitals:   04/28/20 0800 04/28/20 1158  BP: 137/73 123/62  Pulse:  79  Resp:    Temp:    SpO2:  100%    General: Reports breathing back to baseline; no complaining of orthopnea.  Good saturation on chronic supplementation (3-4 L nasal cannula).  No chest pain, no palpitations, no nausea, no vomiting, no fever. Cardiovascular: S1 and S2, no rubs, no gallops, unable to further  assess JVD with body habitus. Respiratory: Improved air movement bilaterally; no wheezing, no crackles, no using accessory muscles. Abdomen: Obese, soft, nontender, nondistended, positive bowel sounds Extremities: Bilateral BKA; no open wounds on his stumps.  Discharge Instructions   Discharge Instructions    (HEART FAILURE PATIENTS) Call MD:  Anytime you have any of the following symptoms: 1) 3 pound weight gain in 24 hours or 5 pounds in 1 week 2) shortness of breath, with or without a dry hacking cough 3) swelling in the hands, feet or stomach 4) if you have to sleep on extra pillows at night in order to breathe.   Complete by: As directed    Diet - low sodium heart healthy   Complete by: As directed    Diet Carb Modified   Complete by: As directed    Discharge instructions   Complete by: As directed    Take medications as prescribed Follow low-sodium diet (less than 2 g daily)  Maintain adequate hydration. Arrange follow-up with PCP in 10 days Follow-up with cardiology service as previously instructed   Increase activity slowly   Complete by: As directed      Allergies as of 04/28/2020      Reactions   No Known Allergies       Medication List    STOP taking these medications   amLODipine 10 MG tablet Commonly known as: NORVASC   fluconazole 200 MG tablet Commonly known as: DIFLUCAN     TAKE these medications   acetaminophen 325 MG tablet Commonly known as: TYLENOL  Take 2 tablets (650 mg total) by mouth every 6 (six) hours as needed for mild pain or headache (fever >/= 101).   aspirin EC 81 MG tablet Take 1 tablet (81 mg total) by mouth daily with breakfast.   baclofen 10 MG tablet Commonly known as: LIORESAL Take 1 tablet (10 mg total) by mouth 3 (three) times daily as needed for muscle spasms. What changed:   when to take this  additional instructions   carvedilol 25 MG tablet Commonly known as: COREG Take 1 tablet (25 mg total) by mouth 2 (two) times  daily with a meal.   clopidogrel 75 MG tablet Commonly known as: PLAVIX Take 1 tablet (75 mg total) by mouth daily with breakfast.   cyanocobalamin 1000 MCG tablet Take 500 mcg by mouth daily.   DULoxetine 30 MG capsule Commonly known as: CYMBALTA Take 60 mg by mouth daily.   ferrous sulfate 325 (65 FE) MG tablet Take 1 tablet (325 mg total) by mouth daily with breakfast.   furosemide 80 MG tablet Commonly known as: LASIX Take 1 tablet (80 mg total) by mouth daily.   hydrALAZINE 50 MG tablet Commonly known as: APRESOLINE Take 1 tablet (50 mg total) by mouth every 8 (eight) hours.   insulin aspart 100 UNIT/ML injection Commonly known as: novoLOG Inject 5 Units into the skin 3 (three) times daily after meals.   insulin glargine 100 UNIT/ML injection Commonly known as: LANTUS Inject 0.18 mLs (18 Units total) into the skin at bedtime.   insulin lispro 100 UNIT/ML injection Commonly known as: HUMALOG Inject 0.04 mLs (4 Units total) into the skin 3 (three) times daily before meals.   ipratropium-albuterol 0.5-2.5 (3) MG/3ML Soln Commonly known as: DUONEB Take 3 mLs by nebulization every 6 (six) hours as needed (SOB/ wheezing).   isosorbide mononitrate 120 MG 24 hr tablet Commonly known as: IMDUR Take 1 tablet (120 mg total) by mouth daily.   Lidocaine 4 % Ptch Apply 1 patch topically daily. Apply 1 patch to both stumps once daily   melatonin 5 MG Tabs Take 1 tablet by mouth at bedtime.   pantoprazole 40 MG tablet Commonly known as: PROTONIX Take 1 tablet (40 mg total) by mouth daily.   polyethylene glycol 17 g packet Commonly known as: MIRALAX / GLYCOLAX Take 17 g by mouth daily.   pregabalin 75 MG capsule Commonly known as: LYRICA Take 1 capsule (75 mg total) by mouth daily.   rosuvastatin 20 MG tablet Commonly known as: CRESTOR Take 1 tablet (20 mg total) by mouth daily.   tamsulosin 0.4 MG Caps capsule Commonly known as: FLOMAX Take 1 capsule (0.4 mg  total) by mouth daily.   vitamin C 500 MG tablet Commonly known as: ASCORBIC ACID Take 500 mg by mouth 2 (two) times daily.      Allergies  Allergen Reactions  . No Known Allergies     Follow-up Information    Iona Beard, MD. Schedule an appointment as soon as possible for a visit in 10 day(s).   Specialty: Family Medicine Contact information: [address] [place] [address] [phone]                The results of significant diagnostics from this hospitalization (including imaging, microbiology, ancillary and laboratory) are listed below for reference.    Significant Diagnostic Studies: DG Chest 2 View  Result Date: 04/28/2020 CLINICAL DATA:  Shortness of breath, chest pain. EXAM: CHEST - 2 VIEW COMPARISON:  April 26, 2020. FINDINGS: Stable cardiomediastinal silhouette. No pneumothorax or pleural effusion is noted. Both lungs are clear. The visualized skeletal structures are unremarkable. IMPRESSION: No active cardiopulmonary disease. Electronically Signed   By: Marijo Conception M.D.   On: 04/28/2020 09:39   DG Chest Port 1 View  Result Date: 04/26/2020 CLINICAL DATA:  Shortness of breath, chest pain EXAM: PORTABLE CHEST 1 VIEW COMPARISON:  04/01/2020 FINDINGS: Single frontal view of the chest demonstrates a stable cardiac silhouette. There is central vascular congestion, with mild bilateral perihilar airspace disease greatest in the infrahilar regions. No effusion or pneumothorax. IMPRESSION: 1. Findings consistent with mild congestive heart failure. Electronically Signed   By: Randa Ngo M.D.   On: 04/26/2020 23:34   DG CHEST PORT 1 VIEW  Result Date: 04/01/2020 CLINICAL DATA:  70 year old male with a history of respiratory distress EXAM: PORTABLE CHEST 1 VIEW COMPARISON:  03/20/2020 FINDINGS: Cardiomediastinal silhouette unchanged in size and contour. Interlobular septal thickening. Fullness in the central vasculature. No pneumothorax. No pleural  effusion. No displaced fracture IMPRESSION: Acute CHF Electronically Signed   By: Corrie Mckusick D.O.   On: 04/01/2020 08:14    Microbiology: Recent Results (from the past 240 hour(s))  Resp Panel by RT-PCR (Flu A&B, Covid) Nasopharyngeal Swab     Status: None   Collection Time: 04/27/20  2:54 AM   Specimen: Nasopharyngeal Swab; Nasopharyngeal(NP) swabs in vial transport medium  Result Value Ref Range Status   SARS Coronavirus 2 by RT PCR NEGATIVE NEGATIVE Final    Comment: (NOTE) SARS-CoV-2 target nucleic acids are NOT DETECTED.  The SARS-CoV-2 RNA is generally detectable in upper respiratory specimens during the acute phase of infection. The lowest concentration of SARS-CoV-2 viral copies this assay can detect is 138 copies/mL. A negative result does not preclude SARS-Cov-2 infection and should not be used as the sole basis for treatment or other patient management decisions. A negative result may occur with  improper specimen collection/handling, submission of specimen other than nasopharyngeal swab, presence of viral mutation(s) within the areas targeted by this assay, and inadequate number of viral copies(<138 copies/mL). A negative result must be combined with clinical observations, patient history, and epidemiological information. The expected result is Negative.  Fact Sheet for Patients:  EntrepreneurPulse.com.au  Fact Sheet for Healthcare Providers:  IncredibleEmployment.be  This test is no t yet approved or cleared by the [place] FDA and  has been authorized for detection and/or diagnosis of SARS-CoV-2 by FDA under an Emergency Use Authorization (EUA). This EUA will remain  in effect (meaning this test can be used) for the duration of the COVID-19 declaration under Section 564(b)(1) of the Act, 21 U.S.C.section 360bbb-3(b)(1), unless the authorization is terminated  or revoked sooner.       Influenza A by PCR NEGATIVE  NEGATIVE Final   Influenza B by PCR NEGATIVE NEGATIVE Final    Comment: (NOTE) The Xpert Xpress SARS-CoV-2/FLU/RSV plus assay is intended as an aid in the diagnosis of influenza from Nasopharyngeal swab specimens and should not be used as a sole basis for treatment. Nasal washings and aspirates are unacceptable for Xpert Xpress SARS-CoV-2/FLU/RSV testing.  Fact Sheet for Patients: EntrepreneurPulse.com.au  Fact Sheet for Healthcare Providers: IncredibleEmployment.be  This test is not yet approved or cleared by the [place] FDA and has been authorized for detection and/or diagnosis of SARS-CoV-2 by FDA under an Emergency Use Authorization (EUA). This EUA will remain in effect (meaning this test can be used) for the duration of  the COVID-19 declaration under Section 564(b)(1) of the Act, 21 U.S.C. section 360bbb-3(b)(1), unless the authorization is terminated or revoked.  Performed at [facility], [address], [place], [place] [address]      Labs: Basic Metabolic Panel: Recent Labs  Lab 04/27/20 0009 04/28/20 0602  NA 134* 133*  K 4.1 3.9  CL 94* 92*  CO2 33* 35*  GLUCOSE 340* 200*  BUN 24* 25*  CREATININE 1.91* 1.75*  CALCIUM 8.4* 8.7*   Liver Function Tests: Recent Labs  Lab 04/27/20 0009  AST 12*  ALT 11  ALKPHOS 74  BILITOT 0.3  PROT 9.9*  ALBUMIN 3.0*   CBC: Recent Labs  Lab 04/27/20 0009 04/28/20 0602  WBC 6.2 4.6  NEUTROABS 4.8  --   HGB 7.5* 7.3*  HCT 24.2* 24.2*  MCV 99.2 98.0  PLT 290 260   BNP (last 3 results) Recent Labs    10/21/19 1138 03/20/20 0119 04/27/20 0009  BNP 90.0 213.0* 352.0*   CBG: Recent Labs  Lab 04/27/20 1124 04/27/20 1605 04/27/20 2056 04/28/20 0743 04/28/20 1144  GLUCAP 135* 232* 336* 189* 229*    Signed:  Barton Dubois MD.  [facility] 04/28/2020, 1:30 PM

## 2020-04-28 NOTE — NC FL2 (Signed)
[place] LEVEL OF CARE SCREENING TOOL     IDENTIFICATION  Patient Name: Cory Weber Birthdate: February 25, 1950 Sex: male Admission Date (Current Location): 04/26/2020  [facility] and [place] Number:  [employer] and Address:  [place] [address], [place]      Provider Number: [id number]  Attending Physician Name and Address:  Barton Dubois, MD  Relative Name and Phone Number:  Geannie Risen (Sister)   [phone]    Current Level of Care: Hospital Recommended Level of Care: Nursing Facility Prior Approval Number:    Date Approved/Denied:   PASRR Number:    Discharge Plan: Other (Comment) (Long term care [facility])    Current Diagnoses: Patient Active Problem List   Diagnosis Date Noted  . Class 2 obesity due to excess calories with body mass index (BMI) of 35.0 to 35.9 in adult   . Acute on chronic combined systolic and diastolic congestive heart failure ([place]) 04/27/2020  . Prolonged QT interval 04/27/2020  . AVM (arteriovenous malformation) of small bowel, acquired   . Gastritis and gastroduodenitis   . Gastric diverticulum   . Grade I internal hemorrhoids   . Occult blood in stools   . Heart failure ([place]) 03/20/2020  . NSTEMI (non-ST elevated myocardial infarction) ([place]) 03/20/2020  . Pneumonia due to COVID-19 virus 02/24/2019  . Acute on chronic respiratory failure with hypoxia ([place]) 02/20/2019  . COVID-19 virus detected 02/20/2019  . UTI (urinary tract infection) 02/20/2019  . DNR (do not resuscitate) 02/20/2019  . PAD (peripheral artery disease) ([place]) 08/22/2016  . Osteomyelitis of foot ([place]) 07/08/2016  . S/P transmetatarsal amputation of foot, right ([place]) 06/26/2016  . Constipation due to opioid therapy 06/15/2016  . Physical deconditioning 06/10/2016  . Wet gangrene ([place]) 06/10/2016  . Anemia, chronic disease 06/10/2016  . Chronic diastolic CHF (congestive heart failure) ([place]) 06/10/2016  . Ischemic  pain of right foot 06/04/2016  . Ischemia of foot 05/31/2016  . 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 Type 2 diabetes with nephropathy ([place]) 01/21/2016  . Uncontrolled type II diabetes with peripheral autonomic neuropathy ([place]) 01/17/2016  . Weight gain 12/20/2015  . Edema of right lower extremity 12/20/2015  . Cough 11/26/2015  . GERD (gastroesophageal reflux disease) 10/08/2015  . Amputation of left lower extremity below knee ([place]) 08/21/2015  . Phantom limb pain ([place])   . Anemia of chronic disease   . HLD (hyperlipidemia)   . BPH (benign prostatic hyperplasia)   . ETOH abuse   . PVD (peripheral vascular disease) ([place])   . Chronic hepatitis C without hepatic coma ([place])   . History of CVA (cerebrovascular accident) without residual deficits   . Hyponatremia   . Chronic kidney disease, stage 3 ([place]) 07/18/2015  . Essential hypertension 07/18/2015  . Acute blood loss anemia 12/01/2013  . S/P lumbar spinal fusion 11/25/2013  . Melena 12/03/2011  . Hemiparesthesia 02/03/2011  . Polysubstance abuse ([place]) 02/03/2011  . Tobacco abuse 02/03/2011    Orientation RESPIRATION BLADDER Height & Weight     Self,Time,Situation,Place  O2 (see dc summary) Incontinent,External catheter Weight: 261 lb 3.9 oz (118.5 kg) Height:  6' (182.9 cm)  BEHAVIORAL SYMPTOMS/MOOD NEUROLOGICAL BOWEL NUTRITION STATUS      Incontinent  (see dc summary)  AMBULATORY STATUS COMMUNICATION OF NEEDS Skin   Limited Assist Non-Verbally Normal  Personal Care Assistance Level of Assistance  Bathing,Feeding,Dressing Bathing Assistance: Limited assistance Feeding assistance: Limited assistance Dressing Assistance: Limited assistance     Functional Limitations Info  Sight,Hearing,Speech Sight Info: Impaired Hearing Info: Adequate Speech Info: Adequate    SPECIAL CARE FACTORS FREQUENCY                       Contractures  Contractures Info: Not present    Additional Factors Info  Code Status Code Status Info: Full Allergies Info: N/A           Current Medications (04/28/2020):  This is the current hospital active medication list Current Facility-Administered Medications  Medication Dose Route Frequency Provider Last Rate Last Admin  . (feeding supplement) PROSource Plus liquid 30 mL  30 mL Oral BID BM Emokpae, Courage, MD   30 mL at 04/28/20 0802  . acetaminophen (TYLENOL) tablet 650 mg  650 mg Oral Q6H PRN Reubin Milan, MD   650 mg at 04/27/20 2213   Or  . acetaminophen (TYLENOL) suppository 650 mg  650 mg Rectal Q6H PRN Reubin Milan, MD      . amLODipine [facility]) tablet 10 mg  10 mg Oral Daily Reubin Milan, MD   10 mg at 04/28/20 0800  . ascorbic acid (VITAMIN C) tablet 500 mg  500 mg Oral BID Reubin Milan, MD   500 mg at 04/28/20 0800  . aspirin EC tablet 81 mg  81 mg Oral Q breakfast Reubin Milan, MD   81 mg at 04/28/20 0801  . clopidogrel (PLAVIX) tablet 75 mg  75 mg Oral Q breakfast Reubin Milan, MD   75 mg at 04/28/20 0800  . DULoxetine (CYMBALTA) DR capsule 60 mg  60 mg Oral Daily Reubin Milan, MD   60 mg at 04/28/20 0800  . feeding supplement (ENSURE ENLIVE / ENSURE PLUS) liquid 237 mL  237 mL Oral Q1400 Emokpae, Courage, MD   237 mL at 04/28/20 1216  . ferrous sulfate tablet 325 mg  325 mg Oral Q breakfast Reubin Milan, MD   325 mg at 04/28/20 0801  . furosemide (LASIX) injection 40 mg  40 mg Intravenous BID Reubin Milan, MD   40 mg at 04/28/20 0802  . hydrALAZINE (APRESOLINE) tablet 50 mg  50 mg Oral Q8H Reubin Milan, MD   50 mg at 04/28/20 1215  . insulin aspart (novoLOG) injection 0-20 Units  0-20 Units Subcutaneous TID WC Reubin Milan, MD   7 Units at 04/28/20 1156  . insulin glargine (LANTUS) injection 18 Units  18 Units Subcutaneous QHS Reubin Milan, MD   18 Units at 04/27/20 2213  . ipratropium-albuterol  (DUONEB) 0.5-2.5 (3) MG/3ML nebulizer solution 3 mL  3 mL Nebulization Q6H PRN Reubin Milan, MD      . isosorbide mononitrate (IMDUR) 24 hr tablet 120 mg  120 mg Oral Daily Reubin Milan, MD   120 mg at 04/28/20 8850  . lidocaine (LIDODERM) 5 % 2 patch  2 patch Transdermal Daily Reubin Milan, MD   2 patch at 04/28/20 [phone]  . melatonin tablet 6 mg  6 mg Oral QHS Reubin Milan, MD   6 mg at 04/27/20 2213  . multivitamin with minerals tablet 1 tablet  1 tablet Oral Daily Roxan Hockey, MD   1 tablet at 04/28/20 0800  . pantoprazole (PROTONIX) EC tablet 40 mg  40 mg Oral Daily Reubin Milan,  MD   40 mg at 04/28/20 0801  . polyethylene glycol (MIRALAX / GLYCOLAX) packet 17 g  17 g Oral Daily Reubin Milan, MD   17 g at 04/28/20 0802  . pregabalin (LYRICA) capsule 75 mg  75 mg Oral Daily Reubin Milan, MD   75 mg at 04/28/20 0801  . prochlorperazine (COMPAZINE) injection 5 mg  5 mg Intravenous Q4H PRN Reubin Milan, MD      . rosuvastatin (CRESTOR) tablet 20 mg  20 mg Oral Daily Reubin Milan, MD   20 mg at 04/28/20 0801  . tamsulosin (FLOMAX) capsule 0.4 mg  0.4 mg Oral Daily Reubin Milan, MD   0.4 mg at 04/28/20 0800  . vitamin B-12 (CYANOCOBALAMIN) tablet 500 mcg  500 mcg Oral Daily Reubin Milan, MD   500 mcg at 04/28/20 0800     Discharge Medications: Please see discharge summary for a list of discharge medications.  Relevant Imaging Results:  Relevant Lab Results:   Additional Information    Shade Flood, LCSW

## 2020-04-28 NOTE — Care Management Obs Status (Signed)
[place] NOTIFICATION   Patient Details  Name: Cory Weber MRN: [id number] Date of Birth: [birth year]   Medicare Observation Status Notification Given:  Yes    [name] 04/28/2020, 11:10 AM

## 2020-04-28 NOTE — TOC Transition Note (Signed)
Transition of Care [facility]) - CM/SW Discharge Note   Patient Details  Name: MAKENA MCGRADY MRN: [id number] Date of Birth: 1950/04/02  Transition of Care [facility]) CM/SW Contact:  Shade Flood, LCSW Phone Number: 04/28/2020, 1:53 PM   Clinical Narrative:     Pt stable to return to [facility] today per MD. Updated Jackelyn Poling at [facility] [place] and they are prepared for pt to return. DC clinical sent electronically. RN to call report. Pt will transport with EMS.   There are no other TOC needs for dc.  Final next level of care: Long Term Nursing Home Barriers to Discharge: Barriers Resolved   Patient Goals and CMS Choice Patient states their goals for this hospitalization and ongoing recovery are:: Return to long term care with [facility] Medicare.gov Compare Post Acute Care list provided to:: Patient Represenative (must comment) ([place] [facility]) Choice offered to / list presented to : Patient  Discharge Placement                       Discharge Plan and Services In-house Referral: NA Discharge Planning Services: NA Post Acute Care Choice: NA          DME Arranged: N/A DME Agency: NA       HH Arranged: NA HH Agency: NA        Social Determinants of Health (SDOH) Interventions     Readmission Risk Interventions Readmission Risk Prevention Plan 03/20/2020  Transportation Screening Complete  PCP or Specialist Appt within 3-5 Days Not Complete  Not Complete comments Patient resides at [facility] and the facility schedules hospital follow up appointments.  [place] or Home Care Consult Complete  Social Work Consult for [place] Planning/Counseling Complete  Palliative Care Screening Not Applicable  Medication Review Press photographer) Complete  Some recent data might be hidden

## 2020-05-08 ENCOUNTER — Encounter: Payer: Self-pay | Admitting: Cardiology

## 2020-05-08 NOTE — Progress Notes (Signed)
Cardiology Office Note  Date: 05/09/2020   ID: Cory Weber, DOB 04/06/1950, MRN [id number]  PCP:  Iona Beard, MD  Cardiologist:  Rozann Lesches, MD Electrophysiologist:  None   Chief Complaint  Patient presents with  . Cardiac follow-up    History of Present Illness: Cory Weber is a medically complex 70 y.o. male former patient of Dr. Bronson Ing now presenting to establish follow-up with me.  I reviewed his records and updated the chart.  He was last assessed via telehealth encounter in August 2020 by Ms. Strader PA-C.  He was recently hospitalized in February with NSTEMI, diagnosis of severe multivessel CAD with poor revascularization options (neither PCI nor CABG) and was managed medically.  He has an associated ischemic cardiomyopathy with LVEF 40 to 45% and was also treated for acute combined heart failure.  He presents for follow-up today.  Currently resides at [facility] in [place].  He is in a wheelchair today.  He tells me that he feels "stronger," no recent chest pain.  I reviewed his medications which are outlined below.  Recent lab work shows LDL 71, creatinine 1.75.  He has not been on ARB/ANRI or Aldactone.  We discussed the results of his cardiac catheterization and plan for medical therapy.   Past Medical History:  Diagnosis Date  . Anemia   . Asthma   . BPH (benign prostatic hyperplasia)   . CAD (coronary artery disease)    Severe multivessel disease 03/2020 - poor revascularization options  . Chronic diastolic heart failure ([place])   . CKD (chronic kidney disease) stage 3, GFR 30-59 ml/min (HCC)   . CVA (cerebral vascular accident) ([place]) 2015  . Essential hypertension   . Gangrene ([place]) 06/10/2016  . GERD (gastroesophageal reflux disease)   . Hepatitis C   . History of pneumonia   . Hyperlipidemia   . Osteomyelitis ([place])   . PAD (peripheral artery disease) (HCC)    Bilateral BKA  . Peripheral neuropathy   . Type II diabetes mellitus  ([place])     Past Surgical History:  Procedure Laterality Date  . ABDOMINAL AORTOGRAM N/A 06/03/2016   Procedure: Abdominal Aortogram;  Surgeon: Waynetta Sandy, MD;  Location: [place] CV [facility];  Service: Cardiovascular;  Laterality: N/A;  . AMPUTATION Left 08/16/2015   Procedure: LEFT BELOW THE KNEE AMPUTATION ;  Surgeon: Serafina Mitchell, MD;  Location: [place];  Service: Vascular;  Laterality: Left;  . AMPUTATION Right 08/22/2016   Procedure: AMPUTATION BELOW KNEE-RIGHT;  Surgeon: Waynetta Sandy, MD;  Location: [place];  Service: Vascular;  Laterality: Right;  . BACK SURGERY    . BELOW KNEE LEG AMPUTATION Left 08/16/2015  . BIOPSY N/A 09/03/2012   Procedure: BIOPSY;  Surgeon: Daneil Dolin, MD;  [facility]: [facility];  Service: Endoscopy;  Laterality: N/A;  gastric and gastric mucosa  . BIOPSY N/A 12/03/2012   Procedure: BIOPSY;  Surgeon: Daneil Dolin, MD;  [facility]: [facility];  Service: Endoscopy;  Laterality: N/A;  . BIOPSY  03/31/2020   Procedure: BIOPSY;  Surgeon: Ladene Artist, MD;  Location: [place];  Service: Endoscopy;;  . COLONOSCOPY WITH PROPOFOL N/A 09/03/2012   [id number] polyp-removed as outlined above. Prominent internal hemorrhoids. Tubular adenoma  . COLONOSCOPY WITH PROPOFOL N/A 04/05/2020   Procedure: COLONOSCOPY WITH PROPOFOL;  Surgeon: Lavena Bullion, DO;  Location: [place];  Service: Gastroenterology;  Laterality: N/A;  . ENTEROSCOPY Left 04/05/2020   Procedure: ENTEROSCOPY;  Surgeon: Lavena Bullion,  DO;  Location: [place];  Service: Gastroenterology;  Laterality: Left;  . ENTEROSCOPY N/A 04/07/2020   Procedure: ENTEROSCOPY;  Surgeon: Lavena Bullion, DO;  Location: [place];  Service: Gastroenterology;  Laterality: N/A;  . ESOPHAGOGASTRODUODENOSCOPY (EGD) WITH PROPOFOL N/A 09/03/2012   [id number] hernia. Gastric diverticulum. Gastric ulcers with associated erosions. Duodenal erosions. Status post gastric biopsy. H.PYLORI gastritis    . ESOPHAGOGASTRODUODENOSCOPY (EGD) WITH PROPOFOL N/A 12/03/2012   Dr. Gala Romney: gastric diverticulum, gastric erosions and scar. Previously noted gastric ulcer completed healed. Biopsy without H.pylori.   . ESOPHAGOGASTRODUODENOSCOPY (EGD) WITH PROPOFOL N/A 01/23/2016   Procedure: ESOPHAGOGASTRODUODENOSCOPY (EGD) WITH PROPOFOL;  Surgeon: Doran Stabler, MD;  Location: [facility];  Service: Gastroenterology;  Laterality: N/A;  . ESOPHAGOGASTRODUODENOSCOPY (EGD) WITH PROPOFOL N/A 03/31/2020   Procedure: ESOPHAGOGASTRODUODENOSCOPY (EGD) WITH PROPOFOL;  Surgeon: Ladene Artist, MD;  Location[facility];  Service: Endoscopy;  Laterality: N/A;  . GIVENS CAPSULE STUDY N/A 04/05/2020   Procedure: GIVENS CAPSULE STUDY;  Surgeon: Lavena Bullion, DO;  Location: [place];  Service: Gastroenterology;  Laterality: N/A;  . HOT HEMOSTASIS N/A 04/07/2020   Procedure: HOT HEMOSTASIS (ARGON PLASMA COAGULATION/BICAP);  Surgeon: Lavena Bullion, DO;  Location: [facility] ENDOSCOPY;  Service: Gastroenterology;  Laterality: N/A;  . I & D EXTREMITY Right 07/11/2016   Procedure: DELAY PRIMARY CLOSURE FOOT AMPUTATION;  Surgeon: Edrick Kins, DPM;  Location: [place];  Service: Podiatry;  Laterality: Right;  . LEFT HEART CATH AND CORONARY ANGIOGRAPHY N/A 03/21/2020   Procedure: LEFT HEART CATH AND CORONARY ANGIOGRAPHY;  Surgeon: Martinique, Peter M, MD;  Location: [place] [facility];  Service: Cardiovascular;  Laterality: N/A;  . LIVER BIOPSY  2005   Done in [place], [place]. Chronic hepatitis with mild periportal inflammation, lobular unicellular necrosis and portal fibrosis. Grade 2, stage 1-2.  . LOWER EXTREMITY ANGIOGRAPHY Right 06/03/2016   Procedure: Lower Extremity Angiography;  Surgeon: Waynetta Sandy, MD;  Location: [place] [facility];  Service: Cardiovascular;  Laterality: Right;  . MAXIMUM ACCESS (MAS)POSTERIOR LUMBAR INTERBODY FUSION (PLIF) 1 LEVEL N/A 11/25/2013   Procedure: FOR MAXIMUM ACCESS (MAS)  POSTERIOR LUMBAR INTERBODY FUSION (PLIF) 1 LEVEL;  Surgeon: Eustace Moore, MD;  Location: [place] [facility];  Service: Neurosurgery;  Laterality: N/A;  FOR MAXIMUM ACCESS (MAS) POSTERIOR LUMBAR INTERBODY FUSION (PLIF) 1 LEVEL LUMBAR 3-4  . PERIPHERAL VASCULAR ATHERECTOMY Right 06/03/2016   Procedure: Peripheral Vascular Atherectomy;  Surgeon: Waynetta Sandy, MD;  Location: [place] CV [facility];  Service: Cardiovascular;  Laterality: Right;  SFA WITH STENT  . PERIPHERAL VASCULAR CATHETERIZATION Left 08/01/2015   Procedure: Lower Extremity Angiography;  Surgeon: Serafina Mitchell, MD;  Location: [place] [facility];  Service: Cardiovascular;  Laterality: Left;  . PERIPHERAL VASCULAR CATHETERIZATION N/A 08/01/2015   Procedure: Abdominal Aortogram;  Surgeon: Serafina Mitchell, MD;  Location: [place] [facility];  Service: Cardiovascular;  Laterality: N/A;  . PERIPHERAL VASCULAR CATHETERIZATION N/A 08/08/2015   Procedure: Abdominal Aortogram w/Lower Extremity;  Surgeon: Serafina Mitchell, MD;  Location: [place] [facility];  Service: Cardiovascular;  Laterality: N/A;  . PERIPHERAL VASCULAR CATHETERIZATION Left 08/08/2015   Procedure: Peripheral Vascular Balloon Angioplasty;  Surgeon: Serafina Mitchell, MD;  Location: [place] [facility];  Service: Cardiovascular;  Laterality: Left;  left popiteal artery, left peronealtrunk, left post tibial  . POLYPECTOMY N/A 09/03/2012   Procedure: POLYPECTOMY;  Surgeon: Daneil Dolin, MD;  [facility];  Service: Endoscopy;  Laterality: N/A;  cecal polyp  .  SUBMUCOSAL TATTOO INJECTION  04/07/2020   Procedure: SUBMUCOSAL TATTOO INJECTION;  Surgeon: Lavena Bullion, DO;  Location: [facility];  Service: Gastroenterology;;  . TONSILLECTOMY    . TRANSMETATARSAL AMPUTATION Right 06/13/2016   Procedure: RIGHT TRANSMETATARSAL AMPUTATION;  Surgeon: Waynetta Sandy, MD;  Location: [place];  Service: Vascular;  Laterality: Right;  . TRANSMETATARSAL AMPUTATION Right 07/10/2016    Procedure: REVISION RIGHT TRANSMETATARSAL AMPUTATION;  Surgeon: Waynetta Sandy, MD;  Location: [place];  Service: Vascular;  Laterality: Right;    Current Outpatient Medications  Medication Sig Dispense Refill  . acetaminophen (TYLENOL) 325 MG tablet Take 2 tablets (650 mg total) by mouth every 6 (six) hours as needed for mild pain or headache (fever >/= 101). 12 tablet 0  . aspirin EC 81 MG tablet Take 1 tablet (81 mg total) by mouth daily with breakfast. 30 tablet 2  . baclofen (LIORESAL) 10 MG tablet Take 1 tablet (10 mg total) by mouth 3 (three) times daily as needed for muscle spasms. (Patient taking differently: Take 10 mg by mouth 2 (two) times daily. For shoulder spasm) 90 each 0  . carvedilol (COREG) 25 MG tablet Take 1 tablet (25 mg total) by mouth 2 (two) times daily with a meal. 60 tablet 3  . clopidogrel (PLAVIX) 75 MG tablet Take 1 tablet (75 mg total) by mouth daily with breakfast. 30 tablet 0  . cyanocobalamin 1000 MCG tablet Take 500 mcg by mouth daily.     . DULoxetine (CYMBALTA) 30 MG capsule Take 60 mg by mouth daily.    . ferrous sulfate 325 (65 FE) MG tablet Take 1 tablet (325 mg total) by mouth daily with breakfast. 30 tablet 3  . furosemide (LASIX) 80 MG tablet Take 1 tablet (80 mg total) by mouth daily. 30 tablet 3  . hydrALAZINE (APRESOLINE) 50 MG tablet Take 1 tablet (50 mg total) by mouth every 8 (eight) hours. 90 tablet 0  . insulin aspart (NOVOLOG) 100 UNIT/ML injection Inject 5 Units into the skin 3 (three) times daily after meals.    . insulin glargine (LANTUS) 100 UNIT/ML injection Inject 0.18 mLs (18 Units total) into the skin at bedtime. 10 mL 11  . insulin lispro (HUMALOG) 100 UNIT/ML injection Inject 0.04 mLs (4 Units total) into the skin 3 (three) times daily before meals. 10 mL 11  . ipratropium-albuterol (DUONEB) 0.5-2.5 (3) MG/3ML SOLN Take 3 mLs by nebulization every 6 (six) hours as needed (SOB/ wheezing).    . isosorbide mononitrate (IMDUR) 120  MG 24 hr tablet Take 1 tablet (120 mg total) by mouth daily. 30 tablet 3  . Lidocaine 4 % PTCH Apply 1 patch topically daily. Apply 1 patch to both stumps once daily    . Melatonin 5 MG TABS Take 1 tablet by mouth at bedtime.    . pantoprazole (PROTONIX) 40 MG tablet Take 1 tablet (40 mg total) by mouth daily. 30 tablet 1  . polyethylene glycol (MIRALAX / GLYCOLAX) 17 g packet Take 17 g by mouth daily.    . pregabalin (LYRICA) 75 MG capsule Take 1 capsule (75 mg total) by mouth daily. 30 capsule 2  . rosuvastatin (CRESTOR) 20 MG tablet Take 1 tablet (20 mg total) by mouth daily. 30 tablet 2  . tamsulosin (FLOMAX) 0.4 MG CAPS capsule Take 1 capsule (0.4 mg total) by mouth daily. 30 capsule 0  . vitamin C (ASCORBIC ACID) 500 MG tablet Take 500 mg by mouth 2 (two) times daily.  No current facility-administered medications for this visit.   Allergies:  No known allergies   ROS: No palpitations or syncope.  Physical Exam: VS:  BP 130/70   Pulse 73   Ht 6\' 1"  (1.854 m)   Wt 250 lb (113.4 kg)   SpO2 99%   BMI 32.98 kg/m , BMI Body mass index is 32.98 kg/m.  Wt Readings from Last 3 Encounters:  05/09/20 250 lb (113.4 kg)  04/27/20 261 lb 3.9 oz (118.5 kg)  04/08/20 262 lb 5.6 oz (119 kg)    General: Patient appears comfortable at rest.  Seated in a wheelchair. HEENT: Conjunctiva and lids normal, wearing a mask. Neck: Supple, no elevated JVP or carotid bruits, no thyromegaly. Lungs: Clear to auscultation, nonlabored breathing at rest. Cardiac: Regular rate and rhythm, no S3, soft systolic murmur, no pericardial rub. Extremities: Status post bilateral BKA.  ECG:  An ECG dated 04/26/2020 was personally reviewed today and demonstrated:  Sinus rhythm with borderline inferior Q waves, nonspecific ST changes and borderline prolonged QT interval.  Recent Labwork: 03/25/2020: Magnesium 2.2 04/27/2020: ALT 11; AST 12; B Natriuretic Peptide 352.0 04/28/2020: BUN 25; Creatinine, Ser 1.75;  Hemoglobin 7.3; Platelets 260; Potassium 3.9; Sodium 133     Component Value Date/Time   CHOL 148 03/21/2020 0206   TRIG 188 (H) 03/21/2020 0206   HDL 39 (L) 03/21/2020 0206   CHOLHDL 3.8 03/21/2020 0206   VLDL 38 03/21/2020 0206   LDLCALC 71 03/21/2020 0206    Other Studies Reviewed Today:  Echocardiogram 03/20/2020: 1. Apical hypokinesis. . Left ventricular ejection fraction, by  estimation, is 40 to 45%. The left ventricle has mildly decreased  function. The left ventricle demonstrates regional wall motion  abnormalities (see scoring diagram/findings for  description). There is mild left ventricular hypertrophy. Left ventricular  diastolic parameters are indeterminate.  2. Right ventricular systolic function is normal. The right ventricular  size is normal.  3. Left atrial size was mildly dilated.  4. The mitral valve is normal in structure. No evidence of mitral valve  regurgitation. No evidence of mitral stenosis.  5. The aortic valve has an indeterminant number of cusps. There is mild  calcification of the aortic valve. There is mild thickening of the aortic  valve. Aortic valve regurgitation is not visualized. No aortic stenosis is  present.  6. The inferior vena cava is normal in size with greater than 50%  respiratory variability, suggesting right atrial pressure of 3 mmHg.   Cardiac catheterization 03/21/2020:  Mid LM to Dist LM lesion is 70% stenosed.  Ost LAD to Prox LAD lesion is 70% stenosed.  Ramus lesion is 70% stenosed.  Ost Cx to Mid Cx lesion is 99% stenosed.  Prox RCA lesion is 40% stenosed.  There is moderate left ventricular systolic dysfunction.  LV end diastolic pressure is severely elevated.  The left ventricular ejection fraction is 35-45% by visual estimate.   1. Severe complex CAD. There is a tapering 70% stenosis in the distal Left main involving as well the ostium of the LAD and ramus intermediate with hazy appearance. The LCx is a  co-dominant vessel with extensive dissection involving the ostium and proximal vessel and is is subtotally occluded. 2. Moderate LV dysfunction. EF 40-45% 3. Severely elevated LVEDP 30 mm Hg.  Plan: patient is not a candidate for PCI. He is likely not a candidate for CABG as well given multiple co-morbidities. Will discuss with rounding team. Likely manage medically. Prognosis is very poor.  Assessment and Plan:  1.  Severe multivessel CAD with poor revascularization options as discussed above.  Plan is for medical therapy.  He does not describe any active angina at this point with low-level activity.  Continue aspirin, Plavix, Coreg, hydralazine, Imdur, and Crestor.  2.  Ischemic cardiomyopathy with LVEF 40 to 45%.  Continue Coreg, Lasix, hydralazine, and Imdur.  Has not been on ARB/Entresto and not good candidate for Aldactone with fluctuating renal insufficiency and CKD stage IIIb at baseline.  3.  CKD stage IIIb, recent creatinine 1.75 with normal potassium.  4.  PAD status post bilateral BKA's.  5.  Mixed hyperlipidemia, on Crestor.  Recent LDL 71.  Medication Adjustments/Labs and Tests Ordered: Current medicines are reviewed at length with the patient today.  Concerns regarding medicines are outlined above.   Tests Ordered: No orders of the defined types were placed in this encounter.   Medication Changes: No orders of the defined types were placed in this encounter.   Disposition:  Follow up 6 months in the [place] office.  Signed, Satira Sark, MD, [facility] 05/09/2020 1:19 PM    [place] at [facility] [address] [place], [place] [address] Phone: [phone]; Fax: [phone]

## 2020-05-09 ENCOUNTER — Ambulatory Visit (INDEPENDENT_AMBULATORY_CARE_PROVIDER_SITE_OTHER): Payer: Medicare (Managed Care) | Admitting: Cardiology

## 2020-05-09 ENCOUNTER — Encounter: Payer: Self-pay | Admitting: Cardiology

## 2020-05-09 ENCOUNTER — Other Ambulatory Visit: Payer: Self-pay

## 2020-05-09 VITALS — BP 130/70 | HR 73 | Ht 73.0 in | Wt 250.0 lb

## 2020-05-09 DIAGNOSIS — I255 Ischemic cardiomyopathy: Secondary | ICD-10-CM

## 2020-05-09 DIAGNOSIS — N1832 Chronic kidney disease, stage 3b: Secondary | ICD-10-CM | POA: Diagnosis not present

## 2020-05-09 DIAGNOSIS — I25119 Atherosclerotic heart disease of native coronary artery with unspecified angina pectoris: Secondary | ICD-10-CM

## 2020-05-09 DIAGNOSIS — I739 Peripheral vascular disease, unspecified: Secondary | ICD-10-CM | POA: Diagnosis not present

## 2020-05-09 DIAGNOSIS — E782 Mixed hyperlipidemia: Secondary | ICD-10-CM

## 2020-05-09 NOTE — Patient Instructions (Signed)
Medication Instructions:  Your physician recommends that you continue on your current medications as directed. Please refer to the Current Medication list given to you today.  *If you need a refill on your cardiac medications before your next appointment, please call your pharmacy*   Lab Work: None today If you have labs (blood work) drawn today and your tests are completely normal, you will receive your results only by: Marland Kitchen MyChart Message (if you have MyChart) OR . A paper copy in the mail If you have any lab test that is abnormal or we need to change your treatment, we will call you to review the results.   Testing/Procedures: nne today   Follow-Up: At [facility], you and your health needs are our priority.  As part of our continuing mission to provide you with exceptional heart care, we have created designated Provider Care Teams.  These Care Teams include your primary Cardiologist (physician) and Advanced Practice Providers (APPs -  Physician Assistants and Nurse Practitioners) who all work together to provide you with the care you need, when you need it.  We recommend signing up for the patient portal called "MyChart".  Sign up information is provided on this After Visit Summary.  MyChart is used to connect with patients for Virtual Visits (Telemedicine).  Patients are able to view lab/test results, encounter notes, upcoming appointments, etc.  Non-urgent messages can be sent to your provider as well.   To learn more about what you can do with MyChart, go to [email or web address].    Your next appointment:   6 month(s)  The format for your next appointment:   In Person  Provider:   Rozann Lesches, MD   Other Instructions None       Thank you for choosing [facility] !

## 2020-06-01 ENCOUNTER — Other Ambulatory Visit: Payer: Self-pay

## 2020-06-01 ENCOUNTER — Encounter (HOSPITAL_COMMUNITY): Payer: Self-pay

## 2020-06-01 ENCOUNTER — Observation Stay (HOSPITAL_COMMUNITY)
Admission: EM | Admit: 2020-06-01 | Discharge: 2020-06-03 | Disposition: A | Discharge status: Home / Self Care | Payer: Medicare (Managed Care) | Attending: Internal Medicine | Admitting: Internal Medicine

## 2020-06-01 DIAGNOSIS — Z87891 Personal history of nicotine dependence: Secondary | ICD-10-CM | POA: Insufficient documentation

## 2020-06-01 DIAGNOSIS — Z79899 Other long term (current) drug therapy: Secondary | ICD-10-CM | POA: Diagnosis not present

## 2020-06-01 DIAGNOSIS — Z8616 Personal history of COVID-19: Secondary | ICD-10-CM | POA: Insufficient documentation

## 2020-06-01 DIAGNOSIS — K2970 Gastritis, unspecified, without bleeding: Secondary | ICD-10-CM | POA: Diagnosis not present

## 2020-06-01 DIAGNOSIS — Z7982 Long term (current) use of aspirin: Secondary | ICD-10-CM | POA: Diagnosis not present

## 2020-06-01 DIAGNOSIS — Z66 Do not resuscitate: Secondary | ICD-10-CM | POA: Diagnosis present

## 2020-06-01 DIAGNOSIS — I2510 Atherosclerotic heart disease of native coronary artery without angina pectoris: Secondary | ICD-10-CM | POA: Insufficient documentation

## 2020-06-01 DIAGNOSIS — Z20822 Contact with and (suspected) exposure to covid-19: Secondary | ICD-10-CM | POA: Insufficient documentation

## 2020-06-01 DIAGNOSIS — K314 Gastric diverticulum: Principal | ICD-10-CM | POA: Insufficient documentation

## 2020-06-01 DIAGNOSIS — Z794 Long term (current) use of insulin: Secondary | ICD-10-CM | POA: Insufficient documentation

## 2020-06-01 DIAGNOSIS — N1832 Chronic kidney disease, stage 3b: Secondary | ICD-10-CM

## 2020-06-01 DIAGNOSIS — N183 Chronic kidney disease, stage 3 unspecified: Secondary | ICD-10-CM | POA: Diagnosis present

## 2020-06-01 DIAGNOSIS — I130 Hypertensive heart and chronic kidney disease with heart failure and stage 1 through stage 4 chronic kidney disease, or unspecified chronic kidney disease: Secondary | ICD-10-CM | POA: Diagnosis not present

## 2020-06-01 DIAGNOSIS — D631 Anemia in chronic kidney disease: Secondary | ICD-10-CM | POA: Diagnosis not present

## 2020-06-01 DIAGNOSIS — D649 Anemia, unspecified: Secondary | ICD-10-CM | POA: Diagnosis present

## 2020-06-01 DIAGNOSIS — F1910 Other psychoactive substance abuse, uncomplicated: Secondary | ICD-10-CM | POA: Insufficient documentation

## 2020-06-01 DIAGNOSIS — I5032 Chronic diastolic (congestive) heart failure: Secondary | ICD-10-CM | POA: Diagnosis present

## 2020-06-01 DIAGNOSIS — J45909 Unspecified asthma, uncomplicated: Secondary | ICD-10-CM | POA: Insufficient documentation

## 2020-06-01 DIAGNOSIS — B182 Chronic viral hepatitis C: Secondary | ICD-10-CM | POA: Diagnosis present

## 2020-06-01 DIAGNOSIS — E1121 Type 2 diabetes mellitus with diabetic nephropathy: Secondary | ICD-10-CM | POA: Diagnosis present

## 2020-06-01 DIAGNOSIS — I10 Essential (primary) hypertension: Secondary | ICD-10-CM | POA: Diagnosis present

## 2020-06-01 DIAGNOSIS — E1122 Type 2 diabetes mellitus with diabetic chronic kidney disease: Secondary | ICD-10-CM | POA: Diagnosis not present

## 2020-06-01 HISTORY — DX: Anemia, unspecified: D64.9

## 2020-06-01 LAB — CBC WITH DIFFERENTIAL/PLATELET
Abs Immature Granulocytes: 0.02 10*3/uL (ref 0.00–0.07)
Basophils Absolute: 0 10*3/uL (ref 0.0–0.1)
Basophils Relative: 0 %
Eosinophils Absolute: 0.2 10*3/uL (ref 0.0–0.5)
Eosinophils Relative: 4 %
HCT: 21.9 % — ABNORMAL LOW (ref 39.0–52.0)
Hemoglobin: 6.7 g/dL — CL (ref 13.0–17.0)
Immature Granulocytes: 1 %
Lymphocytes Relative: 27 %
Lymphs Abs: 1.2 10*3/uL (ref 0.7–4.0)
MCH: 31.2 pg (ref 26.0–34.0)
MCHC: 30.6 g/dL (ref 30.0–36.0)
MCV: 101.9 fL — ABNORMAL HIGH (ref 80.0–100.0)
Monocytes Absolute: 0.3 10*3/uL (ref 0.1–1.0)
Monocytes Relative: 6 %
Neutro Abs: 2.7 10*3/uL (ref 1.7–7.7)
Neutrophils Relative %: 62 %
Platelets: 212 10*3/uL (ref 150–400)
RBC: 2.15 MIL/uL — ABNORMAL LOW (ref 4.22–5.81)
RDW: 15.3 % (ref 11.5–15.5)
WBC: 4.3 10*3/uL (ref 4.0–10.5)
nRBC: 0 % (ref 0.0–0.2)

## 2020-06-01 LAB — IRON AND TIBC
Iron: 83 ug/dL (ref 45–182)
Saturation Ratios: 28 % (ref 17.9–39.5)
TIBC: 300 ug/dL (ref 250–450)
UIBC: 217 ug/dL

## 2020-06-01 LAB — FERRITIN: Ferritin: 36 ng/mL (ref 24–336)

## 2020-06-01 LAB — COMPREHENSIVE METABOLIC PANEL
ALT: 10 U/L (ref 0–44)
AST: 12 U/L — ABNORMAL LOW (ref 15–41)
Albumin: 3 g/dL — ABNORMAL LOW (ref 3.5–5.0)
Alkaline Phosphatase: 51 U/L (ref 38–126)
Anion gap: 4 — ABNORMAL LOW (ref 5–15)
BUN: 27 mg/dL — ABNORMAL HIGH (ref 8–23)
CO2: 35 mmol/L — ABNORMAL HIGH (ref 22–32)
Calcium: 8.7 mg/dL — ABNORMAL LOW (ref 8.9–10.3)
Chloride: 94 mmol/L — ABNORMAL LOW (ref 98–111)
Creatinine, Ser: 1.8 mg/dL — ABNORMAL HIGH (ref 0.61–1.24)
GFR, Estimated: 41 mL/min — ABNORMAL LOW (ref >60)
Glucose, Bld: 279 mg/dL — ABNORMAL HIGH (ref 70–99)
Potassium: 4.5 mmol/L (ref 3.5–5.1)
Sodium: 133 mmol/L — ABNORMAL LOW (ref 135–145)
Total Bilirubin: 0.4 mg/dL (ref 0.3–1.2)
Total Protein: 9.5 g/dL — ABNORMAL HIGH (ref 6.5–8.1)

## 2020-06-01 LAB — CBG MONITORING, ED: Glucose-Capillary: 157 mg/dL — ABNORMAL HIGH (ref 70–99)

## 2020-06-01 LAB — RETICULOCYTES
Immature Retic Fract: 30 % — ABNORMAL HIGH (ref 2.3–15.9)
RBC.: 2.14 MIL/uL — ABNORMAL LOW (ref 4.22–5.81)
Retic Count, Absolute: 55.6 10*3/uL (ref 19.0–186.0)
Retic Ct Pct: 2.6 % (ref 0.4–3.1)

## 2020-06-01 LAB — HEMOGLOBIN AND HEMATOCRIT, BLOOD
HCT: 26.7 % — ABNORMAL LOW (ref 39.0–52.0)
Hemoglobin: 8.3 g/dL — ABNORMAL LOW (ref 13.0–17.0)

## 2020-06-01 LAB — FOLATE: Folate: 14.5 ng/mL (ref >5.9)

## 2020-06-01 LAB — PREPARE RBC (CROSSMATCH)

## 2020-06-01 LAB — GLUCOSE, CAPILLARY: Glucose-Capillary: 197 mg/dL — ABNORMAL HIGH (ref 70–99)

## 2020-06-01 LAB — POC OCCULT BLOOD, ED

## 2020-06-01 LAB — PROTIME-INR
INR: 1 (ref 0.8–1.2)
Prothrombin Time: 13.4 seconds (ref 11.4–15.2)

## 2020-06-01 LAB — VITAMIN B12: Vitamin B-12: 1130 pg/mL — ABNORMAL HIGH (ref 180–914)

## 2020-06-01 MED ORDER — LACTULOSE 10 GM/15ML PO SOLN
10.0000 g | Freq: Once | ORAL | Status: AC
  Administered 2020-06-01: 10 g via ORAL
  Filled 2020-06-01: qty 30

## 2020-06-01 MED ORDER — ISOSORBIDE MONONITRATE ER 60 MG PO TB24
120.0000 mg | ORAL_TABLET | Freq: Every day | ORAL | Status: DC
  Administered 2020-06-02 – 2020-06-03 (×2): 120 mg via ORAL
  Filled 2020-06-01 (×2): qty 2

## 2020-06-01 MED ORDER — HYDRALAZINE HCL 25 MG PO TABS
50.0000 mg | ORAL_TABLET | Freq: Three times a day (TID) | ORAL | Status: DC
  Administered 2020-06-01 – 2020-06-03 (×4): 50 mg via ORAL
  Filled 2020-06-01 (×5): qty 2

## 2020-06-01 MED ORDER — INSULIN GLARGINE 100 UNIT/ML ~~LOC~~ SOLN
12.0000 [IU] | Freq: Every day | SUBCUTANEOUS | Status: DC
  Administered 2020-06-01 – 2020-06-02 (×2): 12 [IU] via SUBCUTANEOUS
  Filled 2020-06-01 (×2): qty 0.12
  Filled 2020-06-01: qty 1
  Filled 2020-06-01 (×2): qty 0.12

## 2020-06-01 MED ORDER — ONDANSETRON HCL 4 MG PO TABS: 4.0000 mg | ORAL_TABLET | Freq: Four times a day (QID) | ORAL | Status: DC | PRN

## 2020-06-01 MED ORDER — ONDANSETRON HCL 4 MG/2ML IJ SOLN: 4.0000 mg | Freq: Four times a day (QID) | INTRAMUSCULAR | Status: DC | PRN

## 2020-06-01 MED ORDER — PANTOPRAZOLE SODIUM 40 MG IV SOLR
40.0000 mg | INTRAVENOUS | Status: DC
  Administered 2020-06-01 – 2020-06-02 (×2): 40 mg via INTRAVENOUS
  Filled 2020-06-01 (×2): qty 40

## 2020-06-01 MED ORDER — SODIUM CHLORIDE 0.9 % IV SOLN
10.0000 mL/h | Freq: Once | INTRAVENOUS | Status: AC
  Administered 2020-06-01: 10 mL/h via INTRAVENOUS

## 2020-06-01 MED ORDER — ACETAMINOPHEN 650 MG RE SUPP: 650.0000 mg | Freq: Four times a day (QID) | RECTAL | Status: DC | PRN

## 2020-06-01 MED ORDER — INSULIN ASPART 100 UNIT/ML ~~LOC~~ SOLN
0.0000 [IU] | SUBCUTANEOUS | Status: DC
  Administered 2020-06-01 (×2): 3 [IU] via SUBCUTANEOUS
  Administered 2020-06-02: 2 [IU] via SUBCUTANEOUS
  Administered 2020-06-02: 3 [IU] via SUBCUTANEOUS
  Administered 2020-06-02: 5 [IU] via SUBCUTANEOUS
  Administered 2020-06-02 (×2): 2 [IU] via SUBCUTANEOUS
  Administered 2020-06-03 (×2): 3 [IU] via SUBCUTANEOUS
  Administered 2020-06-03: 2 [IU] via SUBCUTANEOUS
  Filled 2020-06-01: qty 1

## 2020-06-01 MED ORDER — ACETAMINOPHEN 325 MG PO TABS
650.0000 mg | ORAL_TABLET | Freq: Four times a day (QID) | ORAL | Status: DC | PRN
  Administered 2020-06-02: 650 mg via ORAL
  Filled 2020-06-01: qty 2

## 2020-06-01 MED ORDER — POLYETHYLENE GLYCOL 3350 17 G PO PACK
17.0000 g | PACK | Freq: Two times a day (BID) | ORAL | Status: DC
  Administered 2020-06-01 – 2020-06-02 (×3): 17 g via ORAL
  Filled 2020-06-01 (×4): qty 1

## 2020-06-01 MED ORDER — ROSUVASTATIN CALCIUM 20 MG PO TABS
20.0000 mg | ORAL_TABLET | Freq: Every day | ORAL | Status: DC
  Administered 2020-06-01 – 2020-06-03 (×3): 20 mg via ORAL
  Filled 2020-06-01 (×3): qty 1

## 2020-06-01 MED ORDER — CARVEDILOL 12.5 MG PO TABS
25.0000 mg | ORAL_TABLET | Freq: Two times a day (BID) | ORAL | Status: DC
  Administered 2020-06-02 – 2020-06-03 (×3): 25 mg via ORAL
  Filled 2020-06-01 (×3): qty 2

## 2020-06-01 NOTE — ED Triage Notes (Signed)
Pt brought to ED via [facility] for low hemoglobin of 6. Pt denies rectal bleeding.

## 2020-06-01 NOTE — H&P (Signed)
History and Physical    Cory Weber XLK:[id number] DOB: 1950-10-26 DOA: 06/01/2020  PCP: Iona Beard, MD   Patient coming from: [place] have personally briefly reviewed patient's old medical records in [place]  Chief Complaint: Low Hgb  HPI: BRACEN SCHUM is a 70 y.o. male with medical history significant for systolic and diastolic CHF, coronary artery disease, CVA, hepatitis C, CKD 3, respiratory failure. Patient was sent to the ED with reports of low hemoglobin which was done on routine check.  Reportedly his hemoglobin was 6.  Patient denies any weakness.  No chest pain no difficulty breathing. He denies blood in stools, no black stools, no vomiting of blood.  Reports bowel movement was about 3 days ago, he feels constipated.  Recent hospitalization 3/16 -3/18 for decompensated systolic and diastolic CHF acute on chronic respiratory failure.  Discharge hemoglobin was 7.3.  ED Course: Stable vitals.  O2 sats 98 - 100%.  Hemoglobin 6.7.  EKG showed sinus rhythm.  1 unit of blood ordered for transfusion.  Hospitalist to admit.  Review of Systems: As per HPI all other systems reviewed and negative.  Past Medical History:  Diagnosis Date  . Anemia   . Asthma   . BPH (benign prostatic hyperplasia)   . CAD (coronary artery disease)    Severe multivessel disease 03/2020 - poor revascularization options  . Chronic diastolic heart failure ([place])   . CKD (chronic kidney disease) stage 3, GFR 30-59 ml/min (HCC)   . CVA (cerebral vascular accident) ([place]) 2015  . Essential hypertension   . Gangrene ([place]) 06/10/2016  . GERD (gastroesophageal reflux disease)   . Hepatitis C   . History of pneumonia   . Hyperlipidemia   . Osteomyelitis ([place])   . PAD (peripheral artery disease) (HCC)    Bilateral BKA  . Peripheral neuropathy   . Type II diabetes mellitus ([place])     Past Surgical History:  Procedure Laterality Date  . ABDOMINAL AORTOGRAM N/A 06/03/2016    Procedure: Abdominal Aortogram;  Surgeon: Waynetta Sandy, MD;  Location: [place] [facility] [facility];  Service: Cardiovascular;  Laterality: N/A;  . AMPUTATION Left 08/16/2015   Procedure: LEFT BELOW THE KNEE AMPUTATION ;  Surgeon: Serafina Mitchell, MD;  Location: [facility];  Service: Vascular;  Laterality: Left;  . AMPUTATION Right 08/22/2016   Procedure: AMPUTATION BELOW KNEE-RIGHT;  Surgeon: Waynetta Sandy, MD;  Location: [facility];  Service: Vascular;  Laterality: Right;  . BACK SURGERY    . BELOW KNEE LEG AMPUTATION Left 08/16/2015  . BIOPSY N/A 09/03/2012   Procedure: BIOPSY;  Surgeon: Daneil Dolin, MD;  [facility];  Service: Endoscopy;  Laterality: N/A;  gastric and gastric mucosa  . BIOPSY N/A 12/03/2012   Procedure: BIOPSY;  Surgeon: Daneil Dolin, MD;  [facility]: [facility];  Service: Endoscopy;  Laterality: N/A;  . BIOPSY  03/31/2020   Procedure: BIOPSY;  Surgeon: Ladene Artist, MD;  Location: [facility];  Service: Endoscopy;;  . COLONOSCOPY WITH PROPOFOL N/A 09/03/2012   DGU:[id number] polyp-removed as outlined above. Prominent internal hemorrhoids. Tubular adenoma  . COLONOSCOPY WITH PROPOFOL N/A 04/05/2020   Procedure: COLONOSCOPY WITH PROPOFOL;  Surgeon: Lavena Bullion, DO;  Location: [facility];  Service: Gastroenterology;  Laterality: N/A;  . ENTEROSCOPY Left 04/05/2020   Procedure: ENTEROSCOPY;  Surgeon: Lavena Bullion, DO;  Location: [facility];  Service: Gastroenterology;  Laterality: Left;  . ENTEROSCOPY N/A 04/07/2020   Procedure: ENTEROSCOPY;  Surgeon:  Cirigliano, Dominic Pea, DO;  Location: [facility];  Service: Gastroenterology;  Laterality: N/A;  . ESOPHAGOGASTRODUODENOSCOPY (EGD) WITH PROPOFOL N/A 09/03/2012   WSF:KCLEXN hernia. Gastric diverticulum. Gastric ulcers with associated erosions. Duodenal erosions. Status post gastric biopsy. H.PYLORI gastritis   . ESOPHAGOGASTRODUODENOSCOPY (EGD) WITH PROPOFOL N/A 12/03/2012   Dr. Gala Romney: gastric diverticulum,  gastric erosions and scar. Previously noted gastric ulcer completed healed. Biopsy without H.pylori.   . ESOPHAGOGASTRODUODENOSCOPY (EGD) WITH PROPOFOL N/A 01/23/2016   Procedure: ESOPHAGOGASTRODUODENOSCOPY (EGD) WITH PROPOFOL;  Surgeon: Doran Stabler, MD;  Location: [facility];  Service: Gastroenterology;  Laterality: N/A;  . ESOPHAGOGASTRODUODENOSCOPY (EGD) WITH PROPOFOL N/A 03/31/2020   Procedure: ESOPHAGOGASTRODUODENOSCOPY (EGD) WITH PROPOFOL;  Surgeon: Ladene Artist, MD;  [facility];  Service: Endoscopy;  Laterality: N/A;  . GIVENS CAPSULE STUDY N/A 04/05/2020   Procedure: GIVENS CAPSULE STUDY;  Surgeon: Lavena Bullion, DO;  Location: [facility];  Service: Gastroenterology;  Laterality: N/A;  . HOT HEMOSTASIS N/A 04/07/2020   Procedure: HOT HEMOSTASIS (ARGON PLASMA COAGULATION/BICAP);  Surgeon: Lavena Bullion, DO;  Location: [facility];  Service: Gastroenterology;  Laterality: N/A;  . I & D EXTREMITY Right 07/11/2016   Procedure: DELAY PRIMARY CLOSURE FOOT AMPUTATION;  Surgeon: Edrick Kins, DPM;  Location: [facility];  Service: Podiatry;  Laterality: Right;  . LEFT HEART CATH AND CORONARY ANGIOGRAPHY N/A 03/21/2020   Procedure: LEFT HEART CATH AND CORONARY ANGIOGRAPHY;  Surgeon: Martinique, Peter M, MD;  Location: [place] [facility];  Service: Cardiovascular;  Laterality: N/A;  . LIVER BIOPSY  2005   Done in [place], [place]. Chronic hepatitis with mild periportal inflammation, lobular unicellular necrosis and portal fibrosis. Grade 2, stage 1-2.  . LOWER EXTREMITY ANGIOGRAPHY Right 06/03/2016   Procedure: Lower Extremity Angiography;  Surgeon: Waynetta Sandy, MD;  Location: [facility];  Service: Cardiovascular;  Laterality: Right;  . MAXIMUM ACCESS (MAS)POSTERIOR LUMBAR INTERBODY FUSION (PLIF) 1 LEVEL N/A 11/25/2013   Procedure: FOR MAXIMUM ACCESS (MAS) POSTERIOR LUMBAR INTERBODY FUSION (PLIF) 1 LEVEL;  Surgeon: Eustace Moore, MD;  Location: [place] [facility];   Service: Neurosurgery;  Laterality: N/A;  FOR MAXIMUM ACCESS (MAS) POSTERIOR LUMBAR INTERBODY FUSION (PLIF) 1 LEVEL LUMBAR 3-4  . PERIPHERAL VASCULAR ATHERECTOMY Right 06/03/2016   Procedure: Peripheral Vascular Atherectomy;  Surgeon: Waynetta Sandy, MD;  Location: [place] [facility];  Service: Cardiovascular;  Laterality: Right;  SFA WITH STENT  . PERIPHERAL VASCULAR CATHETERIZATION Left 08/01/2015   Procedure: Lower Extremity Angiography;  Surgeon: Serafina Mitchell, MD;  Location: [facility];  Service: Cardiovascular;  Laterality: Left;  . PERIPHERAL VASCULAR CATHETERIZATION N/A 08/01/2015   Procedure: Abdominal Aortogram;  Surgeon: Serafina Mitchell, MD;  Location: [facility];  Service: Cardiovascular;  Laterality: N/A;  . PERIPHERAL VASCULAR CATHETERIZATION N/A 08/08/2015   Procedure: Abdominal Aortogram w/Lower Extremity;  Surgeon: Serafina Mitchell, MD;  Location: [facility] [facility];  Service: Cardiovascular;  Laterality: N/A;  . PERIPHERAL VASCULAR CATHETERIZATION Left 08/08/2015   Procedure: Peripheral Vascular Balloon Angioplasty;  Surgeon: Serafina Mitchell, MD;  Location: [place];  Service: Cardiovascular;  Laterality: Left;  left popiteal artery, left peronealtrunk, left post tibial  . POLYPECTOMY N/A 09/03/2012   Procedure: POLYPECTOMY;  Surgeon: Daneil Dolin, MD;  [facility];  Service: Endoscopy;  Laterality: N/A;  cecal polyp  . SUBMUCOSAL TATTOO INJECTION  04/07/2020   Procedure: SUBMUCOSAL TATTOO INJECTION;  Surgeon: Lavena Bullion, DO;  Location: [facility];  Service: Gastroenterology;;  . TONSILLECTOMY    . TRANSMETATARSAL AMPUTATION Right 06/13/2016   Procedure: RIGHT TRANSMETATARSAL AMPUTATION;  Surgeon: Waynetta Sandy, MD;  Location: [facility];  Service: Vascular;  Laterality: Right;  . TRANSMETATARSAL AMPUTATION Right 07/10/2016   Procedure: REVISION RIGHT TRANSMETATARSAL AMPUTATION;  Surgeon: Waynetta Sandy, MD;  Location:  [place];  Service: Vascular;  Laterality: Right;     reports that he quit smoking about 4 years ago. His smoking use included cigarettes. He has a 47.00 pack-year smoking history. He has never used smokeless tobacco. He reports that he does not drink alcohol and does not use drugs.  Allergies  Allergen Reactions  . No Known Allergies     Family History  Problem Relation Age of Onset  . Breast cancer Mother   . Heart failure Mother   . Diabetes Father   . Hypertension Father   . Heart disease Father   . Hyperlipidemia Father   . Diabetes Sister   . Hypertension Sister   . Breast cancer Sister   . Colon cancer Neg Hx   . Liver disease Neg Hx     Prior to Admission medications   Medication Sig Start Date End Date Taking? Authorizing Provider  acetaminophen (TYLENOL) 325 MG tablet Take 2 tablets (650 mg total) by mouth every 6 (six) hours as needed for mild pain or headache (fever >/= 101). 02/24/19   Roxan Hockey, MD  aspirin EC 81 MG tablet Take 1 tablet (81 mg total) by mouth daily with breakfast. 02/24/19   Roxan Hockey, MD  baclofen (LIORESAL) 10 MG tablet Take 1 tablet (10 mg total) by mouth 3 (three) times daily as needed for muscle spasms. Patient taking differently: Take 10 mg by mouth 2 (two) times daily. For shoulder spasm 02/25/19   Roxan Hockey, MD  carvedilol (COREG) 25 MG tablet Take 1 tablet (25 mg total) by mouth 2 (two) times daily with a meal. 04/08/20   Regalado, Belkys A, MD  clopidogrel (PLAVIX) 75 MG tablet Take 1 tablet (75 mg total) by mouth daily with breakfast. 04/09/20   Regalado, Belkys A, MD  cyanocobalamin 1000 MCG tablet Take 500 mcg by mouth daily.     [provider]  DULoxetine (CYMBALTA) 30 MG capsule Take 60 mg by mouth daily. 10/20/19   [provider]  ferrous sulfate 325 (65 FE) MG tablet Take 1 tablet (325 mg total) by mouth daily with breakfast. 04/08/20   Regalado, Belkys A, MD  furosemide (LASIX) 80 MG tablet Take 1  tablet (80 mg total) by mouth daily. 04/08/20   Regalado, Belkys A, MD  hydrALAZINE (APRESOLINE) 50 MG tablet Take 1 tablet (50 mg total) by mouth every 8 (eight) hours. 04/08/20   Regalado, Belkys A, MD  insulin aspart (NOVOLOG) 100 UNIT/ML injection Inject 5 Units into the skin 3 (three) times daily after meals. 07/15/16   Domenic Polite, MD  insulin glargine (LANTUS) 100 UNIT/ML injection Inject 0.18 mLs (18 Units total) into the skin at bedtime. 04/08/20   Regalado, Belkys A, MD  insulin lispro (HUMALOG) 100 UNIT/ML injection Inject 0.04 mLs (4 Units total) into the skin 3 (three) times daily before meals. 04/08/20   Regalado, Belkys A, MD  ipratropium-albuterol (DUONEB) 0.5-2.5 (3) MG/3ML SOLN Take 3 mLs by nebulization every 6 (six) hours as needed (SOB/ wheezing).    [provider]  isosorbide mononitrate (IMDUR) 120 MG 24 hr tablet Take 1 tablet (120 mg total) by mouth daily. 04/08/20  Regalado, Belkys A, MD  Lidocaine 4 % PTCH Apply 1 patch topically daily. Apply 1 patch to both stumps once daily    [provider]  Melatonin 5 MG TABS Take 1 tablet by mouth at bedtime.    [provider]  pantoprazole (PROTONIX) 40 MG tablet Take 1 tablet (40 mg total) by mouth daily. 04/08/20   Regalado, Belkys A, MD  polyethylene glycol (MIRALAX / GLYCOLAX) 17 g packet Take 17 g by mouth daily.    [provider]  pregabalin (LYRICA) 75 MG capsule Take 1 capsule (75 mg total) by mouth daily. 04/08/20   Regalado, Belkys A, MD  rosuvastatin (CRESTOR) 20 MG tablet Take 1 tablet (20 mg total) by mouth daily. 04/08/20   Regalado, Belkys A, MD  tamsulosin (FLOMAX) 0.4 MG CAPS capsule Take 1 capsule (0.4 mg total) by mouth daily. 08/10/15   Mikhail, Velta Addison, DO  vitamin C (ASCORBIC ACID) 500 MG tablet Take 500 mg by mouth 2 (two) times daily.    [provider]    Physical Exam: Vitals:   06/01/20 1630 06/01/20 1715 06/01/20 1730 06/01/20 1735  BP: (!) 148/71 (!) 148/72  (!) 153/76 (!) 153/76  Pulse: 71 70 72 72  Resp: 12 13 11 11   Temp:      TempSrc:      SpO2: 98% 98% 100% 100%  Weight:        Constitutional: NAD, calm, comfortable Vitals:   06/01/20 1630 06/01/20 1715 06/01/20 1730 06/01/20 1735  BP: (!) 148/71 (!) 148/72 (!) 153/76 (!) 153/76  Pulse: 71 70 72 72  Resp: 12 13 11 11   Temp:      TempSrc:      SpO2: 98% 98% 100% 100%  Weight:       Eyes: PERRL, lids and conjunctivae normal ENMT: Mucous membranes are moist. Neck: normal, supple, no masses, no thyromegaly Respiratory: clear to auscultation bilaterally, no wheezing, no crackles. Normal respiratory effort. No accessory muscle use.  Cardiovascular: Regular rate and rhythm, no murmurs / rubs / gallops. No extremity edema. 2+ pedal pulses. Abdomen: no tenderness, no masses palpated. No hepatosplenomegaly. Bowel sounds positive.  Musculoskeletal: no clubbing / cyanosis. Bilat BKA. Good ROM, no contractures. Normal muscle tone.  Skin: no rashes, lesions, ulcers. No induration Neurologic: No apparent cranial nerve abnormality, moving extremities spontaneously. Psychiatric: Normal judgment and insight. Alert and oriented x 3. Normal mood.   Labs on Admission: I have personally reviewed following labs and imaging studies  CBC: Recent Labs  Lab 06/01/20 1627  WBC 4.3  NEUTROABS 2.7  HGB 6.7*  HCT 21.9*  MCV 101.9*  PLT 779   Basic Metabolic Panel: Recent Labs  Lab 06/01/20 1627  NA 133*  K 4.5  CL 94*  CO2 35*  GLUCOSE 279*  BUN 27*  CREATININE 1.80*  CALCIUM 8.7*   Liver Function Tests: Recent Labs  Lab 06/01/20 1627  AST 12*  ALT 10  ALKPHOS 51  BILITOT 0.4  PROT 9.5*  ALBUMIN 3.0*   Coagulation Profile: Recent Labs  Lab 06/01/20 1627  INR 1.0    Radiological Exams on Admission: No results found.  EKG: Independently reviewed.  Sinus tachycardia rate 75.  QTc 435.  Q waves in inferior leads.  Assessment/Plan Principal Problem:   Acute on chronic  anemia Active Problems:   Chronic kidney disease, stage 3 (HCC)   Essential hypertension   Chronic hepatitis C without hepatic coma (HCC)   Type 2 diabetes with nephropathy (  [place])   Chronic diastolic CHF (congestive heart failure) ([place])   DNR (do not resuscitate)  Acute on chronic anemia-hemoglobin 6.7, not far off his baseline 7-8.  Stool occult positive, but no melena hematemesis or hematochezia.  No dual antiplatelet.  Last EGD showed small nonbleeding AVM, subsequent push enteroscopy.  Two small, non-bleeding angioectasias in the duodenum. Treated with argon plasma coagulation (APC). A single medium-sized non-bleeding angioectasia in the jejunum. Treated with argon plasma coagulation (APC). -1 unit PRBC ordered for transfusion -Goal hemoglobin greater than 7.5 with CAD history -Posttransfusion hgb, CBC in the morning -Add on anemia panel -IV Protonix 40 daily -Hold aspirin and Plavix. -IV Lasix 40 mg x 1 with transfusion. -Likely component of anemia of chronic disease, may benefit from Aranesp. - GI consult  Chronic diastolic and systolic CHF-stable and compensated.  Last echo 03/2020 EF 40 to 45%, indeterminate LV diastolic parameters. -Resume Lasix 80 daily  Coronary artery disease-recent NSTEMI in February 2022.  With diagnosis of severe multivessel coronary artery disease with poor revascularization options, (neither PCI or CABG), being managed medically. -Continue Coreg, Crestor, Lasix, hydralazine and Imdur. -Hold antiplatelets  CKD 3- 1.8, about baseline.  Controlled  diabetes mellitus-random glucose 279.  Hgba1c 6.6. - SSi- M -Resume home Lantus at reduced dose 12u   DVT prophylaxis: SCDS Code Status: DNR-Universal DNR form at bedside. Family Communication: None at bedside Disposition Plan: ~ 2 days Consults called: GI Admission status: Obs, tele   Bethena Roys MD [facility]  06/01/2020, 6:01 PM

## 2020-06-01 NOTE — ED Provider Notes (Signed)
[facility] EMERGENCY DEPARTMENT Provider Note   CSN: [id number] Arrival date & time: 06/01/20  1608     History Chief Complaint  Patient presents with  . Abnormal Labs    Cory Weber is a 70 y.o. male.  HPI 70 year old male presents with anemia.  History is from the patient.  He had labs drawn this morning, he is not sure if this was just for typical maintenance as he did not have any typical complaints this morning.  However his hemoglobin was found to be around 6 (no lab work data is with patient so this is an estimate).  The patient does note that he felt lightheaded when he sat up this morning but he otherwise has been feeling at baseline.  This includes no chest pain or shortness of breath.  He has not had any bowel movements little low bloody or dark bowel movements for the last 5 or 6 days.  Constipation is an acute on chronic problem for him.  No abdominal pain.   Past Medical History:  Diagnosis Date  . Anemia   . Asthma   . BPH (benign prostatic hyperplasia)   . CAD (coronary artery disease)    Severe multivessel disease 03/2020 - poor revascularization options  . Chronic diastolic heart failure ([place])   . CKD (chronic kidney disease) stage 3, GFR 30-59 ml/min (HCC)   . CVA (cerebral vascular accident) ([place]) 2015  . Essential hypertension   . Gangrene ([place]) 06/10/2016  . GERD (gastroesophageal reflux disease)   . Hepatitis C   . History of pneumonia   . Hyperlipidemia   . Osteomyelitis ([place])   . PAD (peripheral artery disease) (HCC)    Bilateral BKA  . Peripheral neuropathy   . Type II diabetes mellitus [facility])     Patient Active Problem List   Diagnosis Date Noted  . Acute anemia 06/01/2020  . Class 2 obesity due to excess calories with body mass index (BMI) of 35.0 to 35.9 in adult   . Acute on chronic combined systolic and diastolic congestive heart failure ([place]) 04/27/2020  . Prolonged QT interval 04/27/2020  . AVM (arteriovenous malformation) of small  bowel, acquired   . Gastritis and gastroduodenitis   . Gastric diverticulum   . Grade I internal hemorrhoids   . Occult blood in stools   . Heart failure ([place]) 03/20/2020  . NSTEMI (non-ST elevated myocardial infarction) ([place]) 03/20/2020  . Pneumonia due to COVID-19 virus 02/24/2019  . Acute on chronic respiratory failure with hypoxia ([place]) 02/20/2019  . COVID-19 virus detected 02/20/2019  . UTI (urinary tract infection) 02/20/2019  . DNR (do not resuscitate) 02/20/2019  . PAD (peripheral artery disease) ([place]) 08/22/2016  . Osteomyelitis of foot ([place]) 07/08/2016  . S/P transmetatarsal amputation of foot, right ([place]) 06/26/2016  . Constipation due to opioid therapy 06/15/2016  . Physical deconditioning 06/10/2016  . Wet gangrene ([place]) 06/10/2016  . Anemia, chronic disease 06/10/2016  . Chronic diastolic CHF (congestive heart failure) ([place]) 06/10/2016  . Ischemic pain of right foot 06/04/2016  . Ischemia of foot 05/31/2016  . 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 Type 2 diabetes with nephropathy ([place]) 01/21/2016  . Uncontrolled type II diabetes with peripheral autonomic neuropathy ([place]) 01/17/2016  . Weight gain 12/20/2015  . Edema of right lower extremity 12/20/2015  . Cough 11/26/2015  . GERD (gastroesophageal reflux disease) 10/08/2015  . Amputation of left lower extremity below knee ([place]) 08/21/2015  .  Phantom limb pain ([place])   . Anemia of chronic disease   . HLD (hyperlipidemia)   . BPH (benign prostatic hyperplasia)   . ETOH abuse   . PVD (peripheral vascular disease) ([place])   . Chronic hepatitis C without hepatic coma ([place])   . History of CVA (cerebrovascular accident) without residual deficits   . Hyponatremia   . Chronic kidney disease, stage 3 ([place]) 07/18/2015  . Essential hypertension 07/18/2015  . Acute blood loss anemia 12/01/2013  . S/P lumbar spinal fusion 11/25/2013  . Melena 12/03/2011  . Hemiparesthesia  02/03/2011  . Polysubstance abuse ([place]) 02/03/2011  . Tobacco abuse 02/03/2011    Past Surgical History:  Procedure Laterality Date  . ABDOMINAL AORTOGRAM N/A 06/03/2016   Procedure: Abdominal Aortogram;  Surgeon: Waynetta Sandy, MD;  Location: [place] CV LAB;  Service: Cardiovascular;  Laterality: N/A;  . AMPUTATION Left 08/16/2015   Procedure: LEFT BELOW THE KNEE AMPUTATION ;  Surgeon: Serafina Mitchell, MD;  Location: [place];  Service: Vascular;  Laterality: Left;  . AMPUTATION Right 08/22/2016   Procedure: AMPUTATION BELOW KNEE-RIGHT;  Surgeon: Waynetta Sandy, MD;  Location: [place];  Service: Vascular;  Laterality: Right;  . BACK SURGERY    . BELOW KNEE LEG AMPUTATION Left 08/16/2015  . BIOPSY N/A 09/03/2012   Procedure: BIOPSY;  Surgeon: Daneil Dolin, MD;  Location: [facility];  Service: Endoscopy;  Laterality: N/A;  gastric and gastric mucosa  . BIOPSY N/A 12/03/2012   Procedure: BIOPSY;  Surgeon: Daneil Dolin, MD;  Location: [facility];  Service: Endoscopy;  Laterality: N/A;  . BIOPSY  03/31/2020   Procedure: BIOPSY;  Surgeon: Ladene Artist, MD;  Location: [place];  Service: Endoscopy;;  . COLONOSCOPY WITH PROPOFOL N/A 09/03/2012   [id number] polyp-removed as outlined above. Prominent internal hemorrhoids. Tubular adenoma  . COLONOSCOPY WITH PROPOFOL N/A 04/05/2020   Procedure: COLONOSCOPY WITH PROPOFOL;  Surgeon: Lavena Bullion, DO;  Location: [place];  Service: Gastroenterology;  Laterality: N/A;  . ENTEROSCOPY Left 04/05/2020   Procedure: ENTEROSCOPY;  Surgeon: Lavena Bullion, DO;  Location: [place];  Service: Gastroenterology;  Laterality: Left;  . ENTEROSCOPY N/A 04/07/2020   Procedure: ENTEROSCOPY;  Surgeon: Lavena Bullion, DO;  Location: [place];  Service: Gastroenterology;  Laterality: N/A;  . ESOPHAGOGASTRODUODENOSCOPY (EGD) WITH PROPOFOL N/A 09/03/2012   JHE:RDEYCX hernia. Gastric diverticulum. Gastric ulcers with associated  erosions. Duodenal erosions. Status post gastric biopsy. H.PYLORI gastritis   . ESOPHAGOGASTRODUODENOSCOPY (EGD) WITH PROPOFOL N/A 12/03/2012   Dr. Gala Romney: gastric diverticulum, gastric erosions and scar. Previously noted gastric ulcer completed healed. Biopsy without H.pylori.   . ESOPHAGOGASTRODUODENOSCOPY (EGD) WITH PROPOFOL N/A 01/23/2016   Procedure: ESOPHAGOGASTRODUODENOSCOPY (EGD) WITH PROPOFOL;  Surgeon: Doran Stabler, MD;  Location: [facility];  Service: Gastroenterology;  Laterality: N/A;  . ESOPHAGOGASTRODUODENOSCOPY (EGD) WITH PROPOFOL N/A 03/31/2020   Procedure: ESOPHAGOGASTRODUODENOSCOPY (EGD) WITH PROPOFOL;  Surgeon: Ladene Artist, MD;  Location: [facility] ENDOSCOPY;  Service: Endoscopy;  Laterality: N/A;  . GIVENS CAPSULE STUDY N/A 04/05/2020   Procedure: GIVENS CAPSULE STUDY;  Surgeon: Lavena Bullion, DO;  Location: [place];  Service: Gastroenterology;  Laterality: N/A;  . HOT HEMOSTASIS N/A 04/07/2020   Procedure: HOT HEMOSTASIS (ARGON PLASMA COAGULATION/BICAP);  Surgeon: Lavena Bullion, DO;  Location: [facility] ENDOSCOPY;  Service: Gastroenterology;  Laterality: N/A;  . I & D EXTREMITY Right 07/11/2016   Procedure: DELAY PRIMARY CLOSURE FOOT AMPUTATION;  Surgeon: Edrick Kins, DPM;  Location: [place];  Service: Podiatry;  Laterality: Right;  . LEFT HEART CATH AND CORONARY ANGIOGRAPHY N/A 03/21/2020   Procedure: LEFT HEART CATH AND CORONARY ANGIOGRAPHY;  Surgeon: Martinique, Peter M, MD;  Location: [place] CV LAB;  Service: Cardiovascular;  Laterality: N/A;  . LIVER BIOPSY  2005   Done in [place], [place]. Chronic hepatitis with mild periportal inflammation, lobular unicellular necrosis and portal fibrosis. Grade 2, stage 1-2.  . LOWER EXTREMITY ANGIOGRAPHY Right 06/03/2016   Procedure: Lower Extremity Angiography;  Surgeon: Waynetta Sandy, MD;  Location: [place] CV LAB;  Service: Cardiovascular;  Laterality: Right;  . MAXIMUM ACCESS (MAS)POSTERIOR LUMBAR INTERBODY  FUSION (PLIF) 1 LEVEL N/A 11/25/2013   Procedure: FOR MAXIMUM ACCESS (MAS) POSTERIOR LUMBAR INTERBODY FUSION (PLIF) 1 LEVEL;  Surgeon: Eustace Moore, MD;  Location: [place] NEURO ORS;  Service: Neurosurgery;  Laterality: N/A;  FOR MAXIMUM ACCESS (MAS) POSTERIOR LUMBAR INTERBODY FUSION (PLIF) 1 LEVEL LUMBAR 3-4  . PERIPHERAL VASCULAR ATHERECTOMY Right 06/03/2016   Procedure: Peripheral Vascular Atherectomy;  Surgeon: Waynetta Sandy, MD;  Location: [place] CV LAB;  Service: Cardiovascular;  Laterality: Right;  SFA WITH STENT  . PERIPHERAL VASCULAR CATHETERIZATION Left 08/01/2015   Procedure: Lower Extremity Angiography;  Surgeon: Serafina Mitchell, MD;  Location: [place] CV LAB;  Service: Cardiovascular;  Laterality: Left;  . PERIPHERAL VASCULAR CATHETERIZATION N/A 08/01/2015   Procedure: Abdominal Aortogram;  Surgeon: Serafina Mitchell, MD;  Location: [place] CV LAB;  Service: Cardiovascular;  Laterality: N/A;  . PERIPHERAL VASCULAR CATHETERIZATION N/A 08/08/2015   Procedure: Abdominal Aortogram w/Lower Extremity;  Surgeon: Serafina Mitchell, MD;  Location: [place] CV [facility];  Service: Cardiovascular;  Laterality: N/A;  . PERIPHERAL VASCULAR CATHETERIZATION Left 08/08/2015   Procedure: Peripheral Vascular Balloon Angioplasty;  Surgeon: Serafina Mitchell, MD;  Location: [place] CV LAB;  Service: Cardiovascular;  Laterality: Left;  left popiteal artery, left peronealtrunk, left post tibial  . POLYPECTOMY N/A 09/03/2012   Procedure: POLYPECTOMY;  Surgeon: Daneil Dolin, MD;  [facility];  Service: Endoscopy;  Laterality: N/A;  cecal polyp  . SUBMUCOSAL TATTOO INJECTION  04/07/2020   Procedure: SUBMUCOSAL TATTOO INJECTION;  Surgeon: Lavena Bullion, DO;  Location: [facility];  Service: Gastroenterology;;  . TONSILLECTOMY    . TRANSMETATARSAL AMPUTATION Right 06/13/2016   Procedure: RIGHT TRANSMETATARSAL AMPUTATION;  Surgeon: Waynetta Sandy, MD;  Location: [place];  Service:  Vascular;  Laterality: Right;  . TRANSMETATARSAL AMPUTATION Right 07/10/2016   Procedure: REVISION RIGHT TRANSMETATARSAL AMPUTATION;  Surgeon: Waynetta Sandy, MD;  Location: [facility];  Service: Vascular;  Laterality: Right;       Family History  Problem Relation Age of Onset  . Breast cancer Mother   . Heart failure Mother   . Diabetes Father   . Hypertension Father   . Heart disease Father   . Hyperlipidemia Father   . Diabetes Sister   . Hypertension Sister   . Breast cancer Sister   . Colon cancer Neg Hx   . Liver disease Neg Hx     Social History   Tobacco Use  . Smoking status: Former Smoker    Packs/day: 1.00    Years: 47.00    Pack years: 47.00    Types: Cigarettes    Quit date: 07/13/2015    Years since quitting: 4.8  . Smokeless tobacco: Never Used  Vaping Use  . Vaping Use: Never used  Substance Use Topics  . Alcohol use: No    Alcohol/week:  0.0 standard drinks    Comment:  none 08/15/2015 "stopped drinking 3-4 years ago"  . Drug use: No    Comment: 08/15/2015 "last used cocaine 3-4 months ago"    Home Medications Prior to Admission medications   Medication Sig Start Date End Date Taking? Authorizing Provider  acetaminophen (TYLENOL) 325 MG tablet Take 2 tablets (650 mg total) by mouth every 6 (six) hours as needed for mild pain or headache (fever >/= 101). 02/24/19   Roxan Hockey, MD  aspirin EC 81 MG tablet Take 1 tablet (81 mg total) by mouth daily with breakfast. 02/24/19   Roxan Hockey, MD  baclofen (LIORESAL) 10 MG tablet Take 1 tablet (10 mg total) by mouth 3 (three) times daily as needed for muscle spasms. Patient taking differently: Take 10 mg by mouth 2 (two) times daily. For shoulder spasm 02/25/19   Roxan Hockey, MD  carvedilol (COREG) 25 MG tablet Take 1 tablet (25 mg total) by mouth 2 (two) times daily with a meal. 04/08/20   Regalado, Belkys A, MD  clopidogrel (PLAVIX) 75 MG tablet Take 1 tablet (75 mg total) by mouth daily with  breakfast. 04/09/20   Regalado, Belkys A, MD  cyanocobalamin 1000 MCG tablet Take 500 mcg by mouth daily.     [provider]  DULoxetine (CYMBALTA) 30 MG capsule Take 60 mg by mouth daily. 10/20/19   [provider]  ferrous sulfate 325 (65 FE) MG tablet Take 1 tablet (325 mg total) by mouth daily with breakfast. 04/08/20   Regalado, Belkys A, MD  furosemide (LASIX) 80 MG tablet Take 1 tablet (80 mg total) by mouth daily. 04/08/20   Regalado, Belkys A, MD  hydrALAZINE (APRESOLINE) 50 MG tablet Take 1 tablet (50 mg total) by mouth every 8 (eight) hours. 04/08/20   Regalado, Belkys A, MD  insulin aspart (NOVOLOG) 100 UNIT/ML injection Inject 5 Units into the skin 3 (three) times daily after meals. 07/15/16   Domenic Polite, MD  insulin glargine (LANTUS) 100 UNIT/ML injection Inject 0.18 mLs (18 Units total) into the skin at bedtime. 04/08/20   Regalado, Belkys A, MD  insulin lispro (HUMALOG) 100 UNIT/ML injection Inject 0.04 mLs (4 Units total) into the skin 3 (three) times daily before meals. 04/08/20   Regalado, Belkys A, MD  ipratropium-albuterol (DUONEB) 0.5-2.5 (3) MG/3ML SOLN Take 3 mLs by nebulization every 6 (six) hours as needed (SOB/ wheezing).    [provider]  isosorbide mononitrate (IMDUR) 120 MG 24 hr tablet Take 1 tablet (120 mg total) by mouth daily. 04/08/20   Regalado, Belkys A, MD  Lidocaine 4 % PTCH Apply 1 patch topically daily. Apply 1 patch to both stumps once daily    [provider]  Melatonin 5 MG TABS Take 1 tablet by mouth at bedtime.    [provider]  pantoprazole (PROTONIX) 40 MG tablet Take 1 tablet (40 mg total) by mouth daily. 04/08/20   Regalado, Belkys A, MD  polyethylene glycol (MIRALAX / GLYCOLAX) 17 g packet Take 17 g by mouth daily.    [provider]  pregabalin (LYRICA) 75 MG capsule Take 1 capsule (75 mg total) by mouth daily. 04/08/20   Regalado, Belkys A, MD  rosuvastatin (CRESTOR) 20 MG tablet Take 1 tablet (20  mg total) by mouth daily. 04/08/20   Regalado, Belkys A, MD  tamsulosin (FLOMAX) 0.4 MG CAPS capsule Take 1 capsule (0.4 mg total) by mouth daily. 08/10/15   Cristal Ford, DO  vitamin C (ASCORBIC  ACID) 500 MG tablet Take 500 mg by mouth 2 (two) times daily.    [provider]    Allergies    No known allergies  Review of Systems   Review of Systems  Respiratory: Negative for shortness of breath.   Cardiovascular: Negative for chest pain.  Gastrointestinal: Positive for constipation. Negative for abdominal pain and blood in stool.  Neurological: Positive for light-headedness.  All other systems reviewed and are negative.   Physical Exam Updated Vital Signs BP (!) 142/67   Pulse 80   Temp 98 F (36.7 C) (Oral)   Resp 20   Wt 113.4 kg   SpO2 100%   BMI 32.98 kg/m   Physical Exam Vitals and nursing note reviewed. Exam conducted with a chaperone present.  Constitutional:      Appearance: He is well-developed. He is obese.  HENT:     Head: Normocephalic and atraumatic.     Right Ear: External ear normal.     Left Ear: External ear normal.     Nose: Nose normal.  Eyes:     General:        Right eye: No discharge.        Left eye: No discharge.  Cardiovascular:     Rate and Rhythm: Normal rate and regular rhythm.     Heart sounds: Normal heart sounds.  Pulmonary:     Effort: Pulmonary effort is normal.     Breath sounds: Wheezing (slight wheezes) present.  Abdominal:     Palpations: Abdomen is soft.     Tenderness: There is no abdominal tenderness.  Genitourinary:    Rectum: Guaiac result positive. No tenderness.     Comments: No fecal impaction. Small amount of brown stool on exam that is guaiac positive. Musculoskeletal:     Cervical back: Neck supple.     Comments: Bilateral BKA  Skin:    General: Skin is warm and dry.  Neurological:     Mental Status: He is alert.  Psychiatric:        Mood and Affect: Mood is not anxious.     ED Results /  Procedures / Treatments   Labs (all labs ordered are listed, but only abnormal results are displayed) Labs Reviewed  COMPREHENSIVE METABOLIC PANEL - Abnormal; Notable for the following components:      Result Value   Sodium 133 (*)    Chloride 94 (*)    CO2 35 (*)    Glucose, Bld 279 (*)    BUN 27 (*)    Creatinine, Ser 1.80 (*)    Calcium 8.7 (*)    Total Protein 9.5 (*)    Albumin 3.0 (*)    AST 12 (*)    GFR, Estimated 41 (*)    Anion gap 4 (*)    All other components within normal limits  CBC WITH DIFFERENTIAL/PLATELET - Abnormal; Notable for the following components:   RBC 2.15 (*)    Hemoglobin 6.7 (*)    HCT 21.9 (*)    MCV 101.9 (*)    All other components within normal limits  POC OCCULT BLOOD, ED - Abnormal  SARS CORONAVIRUS 2 (TAT 6-24 HRS)  PROTIME-INR  TYPE AND SCREEN  PREPARE RBC (CROSSMATCH)    EKG EKG Interpretation  Date/Time:  Thursday June 01 2020 16:26:52 EDT Ventricular Rate:  77 PR Interval:  166 QRS Duration: 95 QT Interval:  400 QTC Calculation: 453 R Axis:   113 Text Interpretation: Right and left arm  electrode reversal, interpretation assumes no reversal Sinus rhythm Supraventricular bigeminy Probable inferior infarct, old Probable lateral infarct, age indeterminate Need repeat with arm leadreplacement Confirmed by Sherwood Gambler [phone]) on 06/01/2020 4:59:23 PM   Radiology No results found.  Procedures .Critical Care Performed by: Sherwood Gambler, MD Authorized by: Sherwood Gambler, MD   Critical care provider statement:    Critical care time (minutes):  35   Critical care time was exclusive of:  Separately billable procedures and treating other patients   Critical care was necessary to treat or prevent imminent or life-threatening deterioration of the following conditions:  Circulatory failure   Critical care was time spent personally by me on the following activities:  Discussions with consultants, evaluation of patient's  response to treatment, examination of patient, ordering and performing treatments and interventions, ordering and review of laboratory studies, ordering and review of radiographic studies, pulse oximetry, re-evaluation of patient's condition, obtaining history from patient or surrogate and review of old charts     Medications Ordered in ED Medications  0.9 %  sodium chloride infusion (10 mL/hr Intravenous New Bag/Given 06/01/20 1835)    ED Course  I have reviewed the triage vital signs and the nursing notes.  Pertinent labs & imaging results that were available during my care of the patient were reviewed by me and considered in my medical decision making (see chart for details).    MDM Rules/Calculators/A&P                          Patient is found to have acute on chronic anemia with a hemoglobin of 6.7.  He recently has had complex GI bleeding history is and most recently had enteroscopy to fix an AVM.  While he is not in distress or hemodynamically unstable, I think that now that he is under a hemoglobin of 7 with Hemoccult positive stools (though no red blood or melena) he should be admitted for further GI bleeding work-up.  We will transfuse a unit of blood. Final Clinical Impression(s) / ED Diagnoses Final diagnoses:  Symptomatic anemia    Rx / DC Orders ED Discharge Orders    None       Sherwood Gambler, MD 06/01/20 1845

## 2020-06-02 ENCOUNTER — Observation Stay (HOSPITAL_COMMUNITY): Payer: Medicare (Managed Care) | Admitting: Anesthesiology

## 2020-06-02 ENCOUNTER — Ambulatory Visit: Payer: Medicare (Managed Care) | Admitting: Urology

## 2020-06-02 ENCOUNTER — Encounter (HOSPITAL_COMMUNITY)
Admission: EM | Disposition: A | Discharge status: Home / Self Care | Payer: Self-pay | Source: Home / Self Care | Attending: Emergency Medicine

## 2020-06-02 ENCOUNTER — Encounter (HOSPITAL_COMMUNITY): Payer: Self-pay | Admitting: Internal Medicine

## 2020-06-02 DIAGNOSIS — K314 Gastric diverticulum: Secondary | ICD-10-CM

## 2020-06-02 DIAGNOSIS — Z20822 Contact with and (suspected) exposure to covid-19: Secondary | ICD-10-CM | POA: Diagnosis not present

## 2020-06-02 DIAGNOSIS — D649 Anemia, unspecified: Secondary | ICD-10-CM | POA: Diagnosis not present

## 2020-06-02 DIAGNOSIS — K2970 Gastritis, unspecified, without bleeding: Secondary | ICD-10-CM

## 2020-06-02 DIAGNOSIS — J45909 Unspecified asthma, uncomplicated: Secondary | ICD-10-CM | POA: Diagnosis not present

## 2020-06-02 HISTORY — PX: ESOPHAGOGASTRODUODENOSCOPY (EGD) WITH PROPOFOL: SHX5813

## 2020-06-02 LAB — GLUCOSE, CAPILLARY
Glucose-Capillary: 147 mg/dL — ABNORMAL HIGH (ref 70–99)
Glucose-Capillary: 146 mg/dL — ABNORMAL HIGH (ref 70–99)
Glucose-Capillary: 143 mg/dL — ABNORMAL HIGH (ref 70–99)
Glucose-Capillary: 140 mg/dL — ABNORMAL HIGH (ref 70–99)
Glucose-Capillary: 154 mg/dL — ABNORMAL HIGH (ref 70–99)
Glucose-Capillary: 222 mg/dL — ABNORMAL HIGH (ref 70–99)

## 2020-06-02 LAB — TYPE AND SCREEN
ABO/RH(D): O POS
Antibody Screen: NEGATIVE
Unit division: 0

## 2020-06-02 LAB — SARS CORONAVIRUS 2 (TAT 6-24 HRS): SARS Coronavirus 2: NEGATIVE

## 2020-06-02 LAB — CBC
HCT: 26.6 % — ABNORMAL LOW (ref 39.0–52.0)
Hemoglobin: 8.1 g/dL — ABNORMAL LOW (ref 13.0–17.0)
MCH: 30.7 pg (ref 26.0–34.0)
MCHC: 30.5 g/dL (ref 30.0–36.0)
MCV: 100.8 fL — ABNORMAL HIGH (ref 80.0–100.0)
Platelets: 207 10*3/uL (ref 150–400)
RBC: 2.64 MIL/uL — ABNORMAL LOW (ref 4.22–5.81)
RDW: 15.4 % (ref 11.5–15.5)
WBC: 6.3 10*3/uL (ref 4.0–10.5)
nRBC: 0 % (ref 0.0–0.2)

## 2020-06-02 LAB — BPAM RBC
Blood Product Expiration Date: 202205202359
ISSUE DATE / TIME: 202204211827
Unit Type and Rh: 9500

## 2020-06-02 SURGERY — ESOPHAGOGASTRODUODENOSCOPY (EGD) WITH PROPOFOL
Anesthesia: General

## 2020-06-02 MED ORDER — LACTATED RINGERS IV SOLN
INTRAVENOUS | Status: DC
  Administered 2020-06-02: 1000 mL via INTRAVENOUS

## 2020-06-02 MED ORDER — STERILE WATER FOR IRRIGATION IR SOLN
Status: DC | PRN
  Administered 2020-06-02: 1.5 mL

## 2020-06-02 MED ORDER — BISACODYL 10 MG RE SUPP
10.0000 mg | Freq: Once | RECTAL | Status: AC
  Administered 2020-06-02: 10 mg via RECTAL
  Filled 2020-06-02: qty 1

## 2020-06-02 MED ORDER — PROPOFOL 10 MG/ML IV BOLUS
INTRAVENOUS | Status: DC | PRN
  Administered 2020-06-02: 20 mg via INTRAVENOUS
  Administered 2020-06-02 (×2): 40 mg via INTRAVENOUS

## 2020-06-02 MED ORDER — LIDOCAINE HCL (CARDIAC) PF 100 MG/5ML IV SOSY
PREFILLED_SYRINGE | INTRAVENOUS | Status: DC | PRN
  Administered 2020-06-02: 60 mg via INTRAVENOUS

## 2020-06-02 MED ORDER — SODIUM CHLORIDE 0.9 % IV SOLN: INTRAVENOUS | Status: DC

## 2020-06-02 NOTE — Consult Note (Signed)
Referring Provider: [facility] Primary Care Physician:  Iona Beard, MD Primary Gastroenterologist:  Dr. Loletha Carrow ([facility])  Date of Admission: 06/01/20 Date of Consultation: 06/02/20  Reason for Consultation: Acute on chronic anemia  HPI:  Cory Weber is a 70 y.o. male with a past medical history of acute anemia, chronic anemia, chronic diastolic heart failure, CKD, CVA, hepatitis C (lost to follow-up before treatment completed), diabetes, PAD, hyperlipidemia, GERD.  The patient was recently admitted 04/26/2020 through 04/28/2020 for CHF exacerbation.  At that time it was noted he had recently been admitted to [facility] for 19 days secondary to non-STEMI associated with acute systolic/diastolic heart failure complicated by IDA secondary to 3 AVMs which were treated with a PRBC transfusion and APC.  During his most recent hospitalization he had no overt bleeding, stable hemoglobin.  He presented to the emergency department yesterday due to lab work in the morning that documented hemoglobin around 6, per patient estimate.  He has had some lightheadedness but otherwise feels at baseline.  No chest pain or dyspnea.  Denied hematochezia or melena.  Ongoing chronic constipation.  No abdominal pain.  Hemoglobin found to be 6.7 (deemed near baseline of 7-8).  He is macrocytic and normochromic.  Due to his coexisting heart disease he was admitted for further evaluation and treatment with blood transfusions.  He was given 1 unit of blood in the emergency department.  In the emergency department was also noted his stool was occult blood positive but no frank hematochezia or melena.  Not on DAPT any further.  Previous EGD during admission to [facility] with nonbleeding AVM status post push enteroscopy with 2 small nonbleeding angiectasia's in the duodenum treated with APC and a single medium sized nonbleeding angiectasia in the jejunum treated with APC.  He was admitted and started on IV  Protonix, hold aspirin and Plavix.  Query benefit of Aranesp given chronic disease.  Further labs ordered found hemoglobin near baseline of 1.8, although possibly slightly elevated.  Hemoglobin 6.7, INR normal, B12 elevated, folate normal, iron panel normal, ferritin normal.  After transfusion repeat H&H found hemoglobin of 8.3.  This morning it drifted very mildly to 8.1.  Today he states he's doing ok overall. He describes some nausea but no significant vomiting. Still with constipation, which is chronic for him but he just received a dose of MiraLAX and is hoping this will help. No obvious hematochezia or melena that he has seen. He is still on Plavix and ASA. He admits some dysphagia but admits he eats fast and often without his dentures. No other overt GI complaints.  Past Medical History:  Diagnosis Date  . Acute anemia 06/01/2020  . Anemia   . Asthma   . BPH (benign prostatic hyperplasia)   . CAD (coronary artery disease)    Severe multivessel disease 03/2020 - poor revascularization options  . Chronic diastolic heart failure ([place])   . CKD (chronic kidney disease) stage 3, GFR 30-59 ml/min (HCC)   . CVA (cerebral vascular accident) ([place]) 2015  . Essential hypertension   . Gangrene ([place]) 06/10/2016  . GERD (gastroesophageal reflux disease)   . Hepatitis C   . History of pneumonia   . Hyperlipidemia   . Osteomyelitis ([place])   . PAD (peripheral artery disease) (HCC)    Bilateral BKA  . Peripheral neuropathy   . Type II diabetes mellitus ([place])     Past Surgical History:  Procedure Laterality Date  . ABDOMINAL AORTOGRAM N/A 06/03/2016  Procedure: Abdominal Aortogram;  Surgeon: Waynetta Sandy, MD;  Location: [place] CV LAB;  Service: Cardiovascular;  Laterality: N/A;  . AMPUTATION Left 08/16/2015   Procedure: LEFT BELOW THE KNEE AMPUTATION ;  Surgeon: Serafina Mitchell, MD;  Location: [place];  Service: Vascular;  Laterality: Left;  . AMPUTATION Right 08/22/2016   Procedure:  AMPUTATION BELOW KNEE-RIGHT;  Surgeon: Waynetta Sandy, MD;  Location: [place];  Service: Vascular;  Laterality: Right;  . BACK SURGERY    . BELOW KNEE LEG AMPUTATION Left 08/16/2015  . BIOPSY N/A 09/03/2012   Procedure: BIOPSY;  Surgeon: Daneil Dolin, MD;  [facility]: [facility];  Service: Endoscopy;  Laterality: N/A;  gastric and gastric mucosa  . BIOPSY N/A 12/03/2012   Procedure: BIOPSY;  Surgeon: Daneil Dolin, MD;  Location: [facility];  Service: Endoscopy;  Laterality: N/A;  . BIOPSY  03/31/2020   Procedure: BIOPSY;  Surgeon: Ladene Artist, MD;  Location: [place];  Service: Endoscopy;;  . COLONOSCOPY WITH PROPOFOL N/A 09/03/2012   JOA:CZYSAYT polyp-removed as outlined above. Prominent internal hemorrhoids. Tubular adenoma  . COLONOSCOPY WITH PROPOFOL N/A 04/05/2020   Procedure: COLONOSCOPY WITH PROPOFOL;  Surgeon: Lavena Bullion, DO;  Location: [place];  Service: Gastroenterology;  Laterality: N/A;  . ENTEROSCOPY Left 04/05/2020   Procedure: ENTEROSCOPY;  Surgeon: Lavena Bullion, DO;  Location: [place];  Service: Gastroenterology;  Laterality: Left;  . ENTEROSCOPY N/A 04/07/2020   Procedure: ENTEROSCOPY;  Surgeon: Lavena Bullion, DO;  Location: [place];  Service: Gastroenterology;  Laterality: N/A;  . ESOPHAGOGASTRODUODENOSCOPY (EGD) WITH PROPOFOL N/A 09/03/2012   [id number] hernia. Gastric diverticulum. Gastric ulcers with associated erosions. Duodenal erosions. Status post gastric biopsy. H.PYLORI gastritis   . ESOPHAGOGASTRODUODENOSCOPY (EGD) WITH PROPOFOL N/A 12/03/2012   Dr. Gala Romney: gastric diverticulum, gastric erosions and scar. Previously noted gastric ulcer completed healed. Biopsy without H.pylori.   . ESOPHAGOGASTRODUODENOSCOPY (EGD) WITH PROPOFOL N/A 01/23/2016   Procedure: ESOPHAGOGASTRODUODENOSCOPY (EGD) WITH PROPOFOL;  Surgeon: Doran Stabler, MD;  Location: [facility];  Service: Gastroenterology;  Laterality: N/A;  .  ESOPHAGOGASTRODUODENOSCOPY (EGD) WITH PROPOFOL N/A 03/31/2020   Procedure: ESOPHAGOGASTRODUODENOSCOPY (EGD) WITH PROPOFOL;  Surgeon: Ladene Artist, MD;  Location: [facility] ENDOSCOPY;  Service: Endoscopy;  Laterality: N/A;  . GIVENS CAPSULE STUDY N/A 04/05/2020   Procedure: GIVENS CAPSULE STUDY;  Surgeon: Lavena Bullion, DO;  Location: [place];  Service: Gastroenterology;  Laterality: N/A;  . HOT HEMOSTASIS N/A 04/07/2020   Procedure: HOT HEMOSTASIS (ARGON PLASMA COAGULATION/BICAP);  Surgeon: Lavena Bullion, DO;  Location: [facility];  Service: Gastroenterology;  Laterality: N/A;  . I & D EXTREMITY Right 07/11/2016   Procedure: DELAY PRIMARY CLOSURE FOOT AMPUTATION;  Surgeon: Edrick Kins, DPM;  Location: [place];  Service: Podiatry;  Laterality: Right;  . LEFT HEART CATH AND CORONARY ANGIOGRAPHY N/A 03/21/2020   Procedure: LEFT HEART CATH AND CORONARY ANGIOGRAPHY;  Surgeon: Martinique, Peter M, MD;  Location: [place] CV LAB;  Service: Cardiovascular;  Laterality: N/A;  . LIVER BIOPSY  2005   Done in [place], [place]. Chronic hepatitis with mild periportal inflammation, lobular unicellular necrosis and portal fibrosis. Grade 2, stage 1-2.  . LOWER EXTREMITY ANGIOGRAPHY Right 06/03/2016   Procedure: Lower Extremity Angiography;  Surgeon: Waynetta Sandy, MD;  Location: [place] CV LAB;  Service: Cardiovascular;  Laterality: Right;  . MAXIMUM ACCESS (MAS)POSTERIOR LUMBAR INTERBODY FUSION (PLIF) 1 LEVEL N/A 11/25/2013   Procedure: FOR MAXIMUM ACCESS (MAS) POSTERIOR LUMBAR INTERBODY FUSION (PLIF)  1 LEVEL;  Surgeon: Eustace Moore, MD;  Location: [place] [facility]URO ORS;  Service: Neurosurgery;  Laterality: N/A;  FOR MAXIMUM ACCESS (MAS) POSTERIOR LUMBAR INTERBODY FUSION (PLIF) 1 LEVEL LUMBAR 3-4  . PERIPHERAL VASCULAR ATHERECTOMY Right 06/03/2016   Procedure: Peripheral Vascular Atherectomy;  Surgeon: Waynetta Sandy, MD;  Location: [place] CV LAB;  Service: Cardiovascular;  Laterality:  Right;  SFA WITH STENT  . PERIPHERAL VASCULAR CATHETERIZATION Left 08/01/2015   Procedure: Lower Extremity Angiography;  Surgeon: Serafina Mitchell, MD;  Location: [place] CV LAB;  Service: Cardiovascular;  Laterality: Left;  . PERIPHERAL VASCULAR CATHETERIZATION N/A 08/01/2015   Procedure: Abdominal Aortogram;  Surgeon: Serafina Mitchell, MD;  Location: [place] CV LAB;  Service: Cardiovascular;  Laterality: N/A;  . PERIPHERAL VASCULAR CATHETERIZATION N/A 08/08/2015   Procedure: Abdominal Aortogram w/Lower Extremity;  Surgeon: Serafina Mitchell, MD;  Location: [place] [facility];  Service: Cardiovascular;  Laterality: N/A;  . PERIPHERAL VASCULAR CATHETERIZATION Left 08/08/2015   Procedure: Peripheral Vascular Balloon Angioplasty;  Surgeon: Serafina Mitchell, MD;  Location: [place] CV [facility];  Service: Cardiovascular;  Laterality: Left;  left popiteal artery, left peronealtrunk, left post tibial  . POLYPECTOMY N/A 09/03/2012   Procedure: POLYPECTOMY;  Surgeon: Daneil Dolin, MD;  [facility];  Service: Endoscopy;  Laterality: N/A;  cecal polyp  . SUBMUCOSAL TATTOO INJECTION  04/07/2020   Procedure: SUBMUCOSAL TATTOO INJECTION;  Surgeon: Lavena Bullion, DO;  Location: [facility];  Service: Gastroenterology;;  . TONSILLECTOMY    . TRANSMETATARSAL AMPUTATION Right 06/13/2016   Procedure: RIGHT TRANSMETATARSAL AMPUTATION;  Surgeon: Waynetta Sandy, MD;  Location: [place];  Service: Vascular;  Laterality: Right;  . TRANSMETATARSAL AMPUTATION Right 07/10/2016   Procedure: REVISION RIGHT TRANSMETATARSAL AMPUTATION;  Surgeon: Waynetta Sandy, MD;  Location: [place];  Service: Vascular;  Laterality: Right;    Prior to Admission medications   Medication Sig Start Date End Date Taking? Authorizing Provider  acetaminophen (TYLENOL) 325 MG tablet Take 2 tablets (650 mg total) by mouth every 6 (six) hours as needed for mild pain or headache (fever >/= 101). 02/24/19  Yes Roxan Hockey, MD   aspirin EC 81 MG tablet Take 1 tablet (81 mg total) by mouth daily with breakfast. 02/24/19  Yes Emokpae, Courage, MD  baclofen (LIORESAL) 10 MG tablet Take 1 tablet (10 mg total) by mouth 3 (three) times daily as needed for muscle spasms. Patient taking differently: Take 10 mg by mouth every 8 (eight) hours as needed. For shoulder spasm 02/25/19  Yes Emokpae, Courage, MD  carvedilol (COREG) 25 MG tablet Take 1 tablet (25 mg total) by mouth 2 (two) times daily with a meal. 04/08/20  Yes Regalado, Belkys A, MD  cyanocobalamin 1000 MCG tablet Take 500 mcg by mouth daily.    Yes [provider]  hydrALAZINE (APRESOLINE) 50 MG tablet Take 1 tablet (50 mg total) by mouth every 8 (eight) hours. 04/08/20  Yes Regalado, Belkys A, MD  insulin lispro (HUMALOG) 100 UNIT/ML injection Inject 0.04 mLs (4 Units total) into the skin 3 (three) times daily before meals. Patient taking differently: Inject 5 Units into the skin 3 (three) times daily before meals. 04/08/20  Yes Regalado, Belkys A, MD  isosorbide mononitrate (IMDUR) 120 MG 24 hr tablet Take 1 tablet (120 mg total) by mouth daily. 04/08/20  Yes Regalado, Belkys A, MD  Lidocaine 4 % PTCH Apply 1 patch topically daily. Apply 1 patch to both stumps once daily, 12  hour use   Yes [provider]  Melatonin 5 MG TABS Take 1 tablet by mouth at bedtime.   Yes [provider]  rosuvastatin (CRESTOR) 20 MG tablet Take 1 tablet (20 mg total) by mouth daily. 04/08/20  Yes Regalado, Belkys A, MD  clopidogrel (PLAVIX) 75 MG tablet Take 1 tablet (75 mg total) by mouth daily with breakfast. Patient not taking: No sig reported 04/09/20   Regalado, Belkys A, MD  DULoxetine (CYMBALTA) 30 MG capsule Take 60 mg by mouth daily. Patient not taking: Reported on 06/01/2020 10/20/19   [provider]  DULoxetine (CYMBALTA) 60 MG capsule Take 60 mg by mouth daily. Patient not taking: No sig reported 05/23/20   [provider]  ferrous sulfate 325  (65 FE) MG tablet Take 1 tablet (325 mg total) by mouth daily with breakfast. Patient not taking: No sig reported 04/08/20   Regalado, Belkys A, MD  furosemide (LASIX) 80 MG tablet Take 1 tablet (80 mg total) by mouth daily. Patient not taking: Reported on 06/01/2020 04/08/20   Regalado, Jerald Kief A, MD  gabapentin (NEURONTIN) 100 MG capsule Take by mouth. Patient not taking: Reported on 06/01/2020 05/17/20   [provider]  insulin aspart (NOVOLOG) 100 UNIT/ML injection Inject 5 Units into the skin 3 (three) times daily after meals. Patient not taking: No sig reported 07/15/16   Domenic Polite, MD  insulin glargine (LANTUS) 100 UNIT/ML injection Inject 0.18 mLs (18 Units total) into the skin at bedtime. Patient not taking: No sig reported 04/08/20   Regalado, Belkys A, MD  ipratropium-albuterol (DUONEB) 0.5-2.5 (3) MG/3ML SOLN Take 3 mLs by nebulization every 6 (six) hours as needed (SOB/ wheezing). Patient not taking: Reported on 06/01/2020    [provider]  pantoprazole (PROTONIX) 40 MG tablet Take 1 tablet (40 mg total) by mouth daily. Patient not taking: No sig reported 04/08/20   Regalado, Belkys A, MD  polyethylene glycol (MIRALAX / GLYCOLAX) 17 g packet Take 17 g by mouth daily. Patient not taking: Reported on 06/01/2020    [provider]  pregabalin (LYRICA) 75 MG capsule Take 1 capsule (75 mg total) by mouth daily. Patient not taking: No sig reported 04/08/20   Regalado, Belkys A, MD  tamsulosin (FLOMAX) 0.4 MG CAPS capsule Take 1 capsule (0.4 mg total) by mouth daily. Patient not taking: No sig reported 08/10/15   Cristal Ford, DO  vitamin C (ASCORBIC ACID) 500 MG tablet Take 500 mg by mouth 2 (two) times daily. Patient not taking: Reported on 06/01/2020    [provider]    Current Facility-Administered Medications  Medication Dose Route Frequency Provider Last Rate Last Admin  . acetaminophen (TYLENOL) tablet 650 mg  650 mg Oral Q6H PRN Emokpae,  Ejiroghene E, MD       Or  . acetaminophen (TYLENOL) suppository 650 mg  650 mg Rectal Q6H PRN Emokpae, Ejiroghene E, MD      . carvedilol (COREG) tablet 25 mg  25 mg Oral BID WC Emokpae, Ejiroghene E, MD   25 mg at 06/02/20 0759  . hydrALAZINE (APRESOLINE) tablet 50 mg  50 mg Oral Q8H Emokpae, Ejiroghene E, MD   50 mg at 06/01/20 2150  . insulin aspart (novoLOG) injection 0-15 Units  0-15 Units Subcutaneous Q4H Emokpae, Ejiroghene E, MD   2 Units at 06/02/20 0803  . insulin glargine (LANTUS) injection 12 Units  12 Units Subcutaneous QHS Emokpae, Ejiroghene E, MD   12 Units at 06/01/20 2326  .  isosorbide mononitrate (IMDUR) 24 hr tablet 120 mg  120 mg Oral Daily Emokpae, Ejiroghene E, MD      . ondansetron (ZOFRAN) tablet 4 mg  4 mg Oral Q6H PRN Emokpae, Ejiroghene E, MD       Or  . ondansetron (ZOFRAN) injection 4 mg  4 mg Intravenous Q6H PRN Emokpae, Ejiroghene E, MD      . pantoprazole (PROTONIX) injection 40 mg  40 mg Intravenous Q24H Emokpae, Ejiroghene E, MD   40 mg at 06/01/20 2149  . polyethylene glycol (MIRALAX / GLYCOLAX) packet 17 g  17 g Oral BID Emokpae, Ejiroghene E, MD   17 g at 06/01/20 2150  . rosuvastatin (CRESTOR) tablet 20 mg  20 mg Oral Daily Emokpae, Ejiroghene E, MD   20 mg at 06/01/20 2148    Allergies as of 06/01/2020 - Review Complete 06/01/2020  Allergen Reaction Noted  . No known allergies  07/09/2016    Family History  Problem Relation Age of Onset  . Breast cancer Mother   . Heart failure Mother   . Diabetes Father   . Hypertension Father   . Heart disease Father   . Hyperlipidemia Father   . Diabetes Sister   . Hypertension Sister   . Breast cancer Sister   . Colon cancer Neg Hx   . Liver disease Neg Hx     Social History   Socioeconomic History  . Marital status: Divorced    Spouse name: Not on file  . Number of children: 1  . Years of education: Not on file  . Highest education level: Not on file  Occupational History  . Occupation:  disabled  Tobacco Use  . Smoking status: Former Smoker    Packs/day: 1.00    Years: 47.00    Pack years: 47.00    Types: Cigarettes    Quit date: 07/13/2015    Years since quitting: 4.8  . Smokeless tobacco: Never Used  Vaping Use  . Vaping Use: Never used  Substance and Sexual Activity  . Alcohol use: No    Alcohol/week: 0.0 standard drinks    Comment:  none 08/15/2015 "stopped drinking 3-4 years ago"  . Drug use: No    Comment: 08/15/2015 "last used cocaine 3-4 months ago"  . Sexual activity: Not on file  Other Topics Concern  . Not on file  Social History Narrative  . Not on file   Social Determinants of Health   Financial Resource Strain: Not on file  Food Insecurity: Not on file  Transportation Needs: Not on file  Physical Activity: Not on file  Stress: Not on file  Social Connections: Not on file  Intimate Partner Violence: Not on file    Review of Systems: General: Negative for anorexia, weight loss, fever, chills, fatigue, weakness. Eyes: Negative for vision changes.  ENT: Negative for hoarseness, difficulty swallowing , nasal congestion. CV: Negative for chest pain, angina, palpitations, dyspnea on exertion, peripheral edema.  Respiratory: Negative for dyspnea at rest, dyspnea on exertion, cough, sputum, wheezing.  GI: See history of present illness. GU:  Negative for dysuria, hematuria, urinary incontinence, urinary frequency, nocturnal urination.  MS: Negative for joint pain, low back pain.  Derm: Negative for rash or itching.  Neuro: Negative for weakness, abnormal sensation, seizure, frequent headaches, memory loss, confusion.  Psych: Negative for anxiety, depression, suicidal ideation, hallucinations.  Endo: Negative for unusual weight change.  Heme: Negative for bruising or bleeding. Allergy: Negative for rash or hives.  Physical Exam:  Vital signs in last 24 hours: Temp:  [97.7 F (36.5 C)-98.6 F (37 C)] 98.6 F (37 C) (04/22 0756) Pulse Rate:   [62-86] 86 (04/22 0756) Resp:  [8-24] 18 (04/22 0756) BP: (135-159)/(65-105) 155/73 (04/22 0756) SpO2:  [98 %-100 %] 100 % (04/22 0450) Weight:  [113.4 kg-120 kg] 120 kg (04/21 2238)   General:   Alert,  Well-developed, well-nourished, pleasant and cooperative in NAD Head:  Normocephalic and atraumatic. Eyes:  Sclera clear, no icterus.   Conjunctiva pink. Ears:  Normal auditory acuity. Nose:  No deformity, discharge,  or lesions. Mouth:  No deformity or lesions, dentition normal. Neck:  Supple; no masses or thyromegaly. Lungs:  Clear throughout to auscultation.   No wheezes, crackles, or rhonchi. No acute distress. Heart:  Regular rate and rhythm; no murmurs, clicks, rubs,  or gallops. Abdomen:  Soft, nontender and nondistended. No masses, hepatosplenomegaly or hernias noted. Normal bowel sounds, without guarding, and without rebound.   Rectal:  Deferred until time of colonoscopy.   Msk:  Symmetrical without gross deformities. Normal posture. Pulses:  Normal pulses noted. Extremities:  Without clubbing or edema. Neurologic:  Alert and  oriented x4;  grossly normal neurologically. Skin:  Intact without significant lesions or rashes. Cervical Nodes:  No significant cervical adenopathy. Psych:  Alert and cooperative. Normal mood and affect.  Intake/Output from previous day: 04/21 0701 - 04/22 0700 In: 348 [Blood:348] Out: 700 [Urine:700] Intake/Output this shift: Total I/O In: -  Out: 400 [Urine:400]  Lab Results: Recent Labs    06/01/20 1627 06/01/20 2238 06/02/20 0358  WBC 4.3  --  6.3  HGB 6.7* 8.3* 8.1*  HCT 21.9* 26.7* 26.6*  PLT 212  --  207   BMET Recent Labs    06/01/20 1627  NA 133*  K 4.5  CL 94*  CO2 35*  GLUCOSE 279*  BUN 27*  CREATININE 1.80*  CALCIUM 8.7*   LFT Recent Labs    06/01/20 1627  PROT 9.5*  ALBUMIN 3.0*  AST 12*  ALT 10  ALKPHOS 51  BILITOT 0.4   PT/INR Recent Labs    06/01/20 1627  LABPROT 13.4  INR 1.0   Hepatitis  Panel No results for input(s): HEPBSAG, HCVAB, HEPAIGM, HEPBIGM in the last 72 hours. C-Diff No results for input(s): CDIFFTOX in the last 72 hours.  Studies/Results: No results found.  Impression: Very pleasant 70 year old male with a complicated past medical history as outlined in HPI.  Previously admitted this year to [facility] for 90 days secondary to non-STEMI and heart failure complicated by IDA.  Push enteroscopy found 3 lymphangiectasia's not actively bleeding that were coagulated with APC.  His hemoglobin, long-term, tends to trend around 7.5/8-9.  On admission his hemoglobin is now 6.7.  Normal INR, macrocytic, normochromic with normal folate and B12, normal iron panel and ferritin.  He was transfused to 8.3 yesterday and is drifted somewhat mildly today to 8.1.  Plavix currently being held.  Denies any obvious GI bleeding but he was heme positive in the ED.  Differentials are quite broad, but more likely upper GI source including possible esophagitis, gastritis, duodenitis, peptic ulcer disease, AVMs, Dieulafoy lesion, recurrent lymphangiectasia's.  Unlikely malignant process given recent push enteroscopy.  I feel he would benefit from an upper endoscopy today.  Plan: 1. EGD today 2. Continue n.p.o. 3. Can use MiraLAX or lactulose as needed for constipation 4. Further recommendations to follow 5. Supportive care   Thank you for allowing  [place] to participate in the care of Cory Hay, DNP, AGNP-C Adult & Gerontological Nurse Practitioner [facility]   LOS: 0 days     06/02/2020, 8:25 AM

## 2020-06-02 NOTE — Transfer of Care (Signed)
Immediate Anesthesia Transfer of Care Note  Patient: Cory Weber  Procedure(s) Performed: ESOPHAGOGASTRODUODENOSCOPY (EGD) WITH PROPOFOL (N/A )  Patient Location: PACU  Anesthesia Type:General  Level of Consciousness: awake  Airway & Oxygen Therapy: Patient Spontanous Breathing  Post-op Assessment: Report given to RN and Post -op Vital signs reviewed and stable  Post vital signs: Reviewed and stable  Last Vitals:  Vitals Value Taken Time  BP    Temp    Pulse    Resp    SpO2      Last Pain:  Vitals:   06/02/20 1425  TempSrc:   PainSc: 0-No pain         Complications: No complications documented.

## 2020-06-02 NOTE — Anesthesia Preprocedure Evaluation (Signed)
Anesthesia Evaluation  Patient identified by MRN, date of birth, ID band Patient awake    Reviewed: Allergy & Precautions, H&P , NPO status , Patient's Chart, lab work & pertinent test results, reviewed documented beta blocker date and time   Airway Mallampati: II  TM Distance: >3 FB Neck ROM: full    Dental no notable dental hx.    Pulmonary neg pulmonary ROS, former smoker,    Pulmonary exam normal breath sounds clear to auscultation       Cardiovascular Exercise Tolerance: Good hypertension, + CAD, + Past MI, + Peripheral Vascular Disease and +CHF   Rhythm:regular Rate:Normal     Neuro/Psych  Neuromuscular disease CVA negative psych ROS   GI/Hepatic GERD  Medicated,(+)     substance abuse  alcohol use, Hepatitis -, C  Endo/Other  diabetesMorbid obesity  Renal/GU CRFRenal disease  negative genitourinary   Musculoskeletal   Abdominal   Peds  Hematology negative hematology ROS (+) Blood dyscrasia, anemia ,   Anesthesia Other Findings Tolerated 3 anesthetics well in Feb 2022.  Typical propofol rate ~ 75.  Typical induction bolus 30-60mg .    Known complex CAD, medically managed.  Reproductive/Obstetrics negative OB ROS                             Anesthesia Physical Anesthesia Plan  ASA: IV and emergent  Anesthesia Plan: General   Post-op Pain Management:    Induction:   PONV Risk Score and Plan: Propofol infusion  Airway Management Planned:   Additional Equipment:   Intra-op Plan:   Post-operative Plan:   Informed Consent: I have reviewed the patients History and Physical, chart, labs and discussed the procedure including the risks, benefits and alternatives for the proposed anesthesia with the patient or authorized representative who has indicated his/her understanding and acceptance.     Dental Advisory Given  Plan Discussed with: CRNA  Anesthesia Plan Comments:          Anesthesia Quick Evaluation

## 2020-06-02 NOTE — Anesthesia Postprocedure Evaluation (Signed)
Anesthesia Post Note  Patient: Cory Weber  Procedure(s) Performed: ESOPHAGOGASTRODUODENOSCOPY (EGD) WITH PROPOFOL (N/A )  Patient location during evaluation: Phase II Anesthesia Type: General Level of consciousness: awake Pain management: pain level controlled Vital Signs Assessment: post-procedure vital signs reviewed and stable Respiratory status: spontaneous breathing and respiratory function stable Cardiovascular status: blood pressure returned to baseline and stable Postop Assessment: no headache and no apparent nausea or vomiting Anesthetic complications: no Comments: Late entry   No complications documented.   Last Vitals:  Vitals:   06/02/20 1319 06/02/20 1437  BP: 129/82 120/80  Pulse: 71 86  Resp: 12 13  Temp: 37 C 37 C  SpO2: 99% 98%    Last Pain:  Vitals:   06/02/20 1437  TempSrc: Oral  PainSc:                  Louann Sjogren

## 2020-06-02 NOTE — Op Note (Signed)
[facility] Patient Name: Cory Weber Procedure Date: 06/02/2020 2:13 PM MRN: [id number] Date of Birth: 03-11-1950 Attending MD: Elon Alas. Abbey Chatters DO CSN: [id number] Age: 70 Admit Type: Outpatient Procedure:                Upper GI endoscopy Indications:              Iron deficiency anemia Providers:                Elon Alas. Abbey Chatters, DO, Charlsie Quest. Theda Sers RN, RN,                            Aram Candela Referring MD:              Medicines:                See the Anesthesia note for documentation of the                            administered medications Complications:            No immediate complications. Estimated Blood Loss:     Estimated blood loss: none. Estimated blood loss                            was minimal. Procedure:                Pre-Anesthesia Assessment:                           - The anesthesia plan was to use monitored                            anesthesia care (MAC).                           After obtaining informed consent, the endoscope was                            passed under direct vision. Throughout the                            procedure, the patient's blood pressure, pulse, and                            oxygen saturations were monitored continuously. The                            [id number]-H190 ([id number]) scope was introduced through the                            mouth, and advanced to the second part of duodenum.                            The upper GI endoscopy was accomplished without                            difficulty. The patient tolerated the procedure  well. Scope In: Scope Out: 2:31:56 PM Findings:      There is no endoscopic evidence of Barrett's esophagus, bleeding, areas       of erosion, esophagitis, inflammation, ulcerations or varices in the       entire esophagus.      A non-bleeding diverticulum was found in the gastric fundus.      Patchy mild inflammation characterized by erythema was found in  the       gastric fundus and in the gastric antrum. Biopsies were taken with a       cold forceps for Helicobacter pylori testing.      The duodenal bulb, first portion of the duodenum and second portion of       the duodenum were normal. No AVMs identified Impression:               - Gastric diverticulum.                           - Gastritis. Biopsied.                           - Normal duodenal bulb, first portion of the                            duodenum and second portion of the duodenum. Moderate Sedation:      Per Anesthesia Care Recommendation:           - Advance diet as tolerated.                           - Use a proton pump inhibitor PO daily.                           - Continue to monitor Hgb and transfuse for <7. No                            active or stigmata of bleeding identified on                            today's EGD. Procedure Code(s):        --- Professional ---                           [phone], Esophagogastroduodenoscopy, flexible,                            transoral; with biopsy, single or multiple Diagnosis Code(s):        --- Professional ---                           K31.4, Gastric diverticulum                           K29.70, Gastritis, unspecified, without bleeding                           D50.9, Iron deficiency anemia, unspecified CPT copyright 2019 American Medical Association. All rights reserved. The codes documented in  this report are preliminary and upon coder review may  be revised to meet current compliance requirements. Elon Alas. Abbey Chatters, DO [place] Abbey Chatters, DO 06/02/2020 2:41:50 PM This report has been signed electronically. Number of Addenda: 0

## 2020-06-02 NOTE — Progress Notes (Signed)
PROGRESS NOTE    Cory Weber  HUD:[id number] DOB: 12-30-1950 DOA: 06/01/2020 PCP: Iona Beard, MD   Brief Narrative:   Cory Weber is a 70 y.o. male with medical history significant for systolic and diastolic CHF, coronary artery disease, CVA, hepatitis C, CKD 3, respiratory failure. Patient was sent to the ED with reports of low hemoglobin which was done on routine check.  He is status post EGD on 4/22 with no acute findings noted.  He has had 1 unit PRBCs with improvement in his hemoglobin levels.  Assessment & Plan:   Principal Problem:   Acute on chronic anemia Active Problems:   Chronic kidney disease, stage 3 (HCC)   Essential hypertension   Chronic hepatitis C without hepatic coma (HCC)   Type 2 diabetes with nephropathy (HCC)   Chronic diastolic CHF (congestive heart failure) (HCC)   DNR (do not resuscitate)   Acute on chronic anemia -Improved status post 1 unit PRBC transfusion -Appreciate GI evaluation with EGD, no acute findings noted -Continue PPI -Monitor hemoglobin levels in a.m.  Chronic diastolic and systolic CHF -Last 2D echocardiogram 2/22 with LVEF 40-45% -Lasix 80 mg daily  CAD -Recent NSTEMI in 2/22 -Continue Coreg, Crestor, hydralazine, and Imdur -Hold antiplatelets for now  CKD 3b -Appears to be at baseline, recheck in a.m.  Controlled type 2 diabetes -Hemoglobin A1c 6.6% -Carb modified diet -SSI   DVT prophylaxis:SCDs Code Status: DNR Family Communication: None at bedside Disposition Plan:  Status is: Observation  The patient will require care spanning > 2 midnights and should be moved to inpatient because: Inpatient level of care appropriate due to severity of illness  Dispo: The patient is from: SNF              Anticipated d/c is to: SNF              Patient currently is not medically stable to d/c.   Difficult to place patient No   Consultants:   GI  Procedures:   EGD 4/22  Antimicrobials:    None   Subjective: Patient seen and evaluated today with no new acute complaints or concerns. No acute concerns or events noted overnight.  Objective: Vitals:   06/02/20 0450 06/02/20 0756 06/02/20 1319 06/02/20 1437  BP: (!) 159/79 (!) 155/73 129/82 120/80  Pulse: 83 86 71 86  Resp: 20 18 12 13   Temp: 97.7 F (36.5 C) 98.6 F (37 C) 98.6 F (37 C) 98.6 F (37 C)  TempSrc:  Oral Oral Oral  SpO2: 100% 98% 99% 98%  Weight:      Height:        Intake/Output Summary (Last 24 hours) at 06/02/2020 1448 Last data filed at 06/02/2020 1430 Gross per 24 hour  Intake 348 ml  Output 1550 ml  Net -1202 ml   Filed Weights   06/01/20 1613 06/01/20 2238  Weight: 113.4 kg 120 kg    Examination:  General exam: Appears calm and comfortable, obese Respiratory system: Clear to auscultation. Respiratory effort normal, [place] oxygen Cardiovascular system: S1 & S2 heard, RRR.  Gastrointestinal system: Abdomen is soft Central nervous system: Alert and awake Extremities: No edema Skin: No significant lesions noted Psychiatry: Flat affect.    Data Reviewed: I have personally reviewed following labs and imaging studies  CBC: Recent Labs  Lab 06/01/20 1627 06/01/20 2238 06/02/20 0358  WBC 4.3  --  6.3  NEUTROABS 2.7  --   --   HGB 6.7* 8.3* 8.1*  HCT 21.9* 26.7* 26.6*  MCV 101.9*  --  100.8*  PLT 212  --  242   Basic Metabolic Panel: Recent Labs  Lab 06/01/20 1627  NA 133*  K 4.5  CL 94*  CO2 35*  GLUCOSE 279*  BUN 27*  CREATININE 1.80*  CALCIUM 8.7*   GFR: Estimated Creatinine Clearance: 54 mL/min (A) (by C-G formula based on SCr of 1.8 mg/dL (H)). Liver Function Tests: Recent Labs  Lab 06/01/20 1627  AST 12*  ALT 10  ALKPHOS 51  BILITOT 0.4  PROT 9.5*  ALBUMIN 3.0*   No results for input(s): LIPASE, AMYLASE in the last 168 hours. No results for input(s): AMMONIA in the last 168 hours. Coagulation Profile: Recent Labs  Lab 06/01/20 1627  INR 1.0    Cardiac Enzymes: No results for input(s): CKTOTAL, CKMB, CKMBINDEX, TROPONINI in the last 168 hours. BNP (last 3 results) No results for input(s): PROBNP in the last 8760 hours. HbA1C: No results for input(s): HGBA1C in the last 72 hours. CBG: Recent Labs  Lab 06/01/20 2243 06/02/20 0448 06/02/20 0734 06/02/20 1105 06/02/20 1328  GLUCAP 197* 147* 146* 143* 140*   Lipid Profile: No results for input(s): CHOL, HDL, LDLCALC, TRIG, CHOLHDL, LDLDIRECT in the last 72 hours. Thyroid Function Tests: No results for input(s): TSH, T4TOTAL, FREET4, T3FREE, THYROIDAB in the last 72 hours. Anemia Panel: Recent Labs    06/01/20 1627 06/01/20 1635 06/01/20 2030  VITAMINB12 1,130*  --   --   FOLATE  --  14.5  --   FERRITIN 36  --   --   TIBC 300  --   --   IRON 83  --   --   RETICCTPCT  --   --  2.6   Sepsis Labs: No results for input(s): PROCALCITON, LATICACIDVEN in the last 168 hours.  Recent Results (from the past 240 hour(s))  SARS CORONAVIRUS 2 (TAT 6-24 HRS) Nasopharyngeal Nasopharyngeal Swab     Status: None   Collection Time: 06/01/20  5:30 PM   Specimen: Nasopharyngeal Swab  Result Value Ref Range Status   SARS Coronavirus 2 NEGATIVE NEGATIVE Final    Comment: (NOTE) SARS-CoV-2 target nucleic acids are NOT DETECTED.  The SARS-CoV-2 RNA is generally detectable in upper and lower respiratory specimens during the acute phase of infection. Negative results do not preclude SARS-CoV-2 infection, do not rule out co-infections with other pathogens, and should not be used as the sole basis for treatment or other patient management decisions. Negative results must be combined with clinical observations, patient history, and epidemiological information. The expected result is Negative.  Fact Sheet for Patients: SugarRoll.be  Fact Sheet for Healthcare Providers: https://www.woods-mathews.com/  This test is not yet approved or  cleared by the [place] FDA and  has been authorized for detection and/or diagnosis of SARS-CoV-2 by FDA under an Emergency Use Authorization (EUA). This EUA will remain  in effect (meaning this test can be used) for the duration of the COVID-19 declaration under Se ction 564(b)(1) of the Act, 21 U.S.C. section 360bbb-3(b)(1), unless the authorization is terminated or revoked sooner.  Performed at [facility], [place] [address], [place], [place] [address]          Radiology Studies: No results found.      Scheduled Meds: . carvedilol  25 mg Oral BID WC  . hydrALAZINE  50 mg Oral Q8H  . insulin aspart  0-15 Units Subcutaneous Q4H  . insulin glargine  12 Units  Subcutaneous QHS  . isosorbide mononitrate  120 mg Oral Daily  . pantoprazole (PROTONIX) IV  40 mg Intravenous Q24H  . polyethylene glycol  17 g Oral BID  . rosuvastatin  20 mg Oral Daily    LOS: 0 days    Time spent: 35 minutes    Katherin Ramey Darleen Crocker, DO [facility]  If 7PM-7AM, please contact night-coverage [email or web address] 06/02/2020, 2:48 PM

## 2020-06-03 DIAGNOSIS — K314 Gastric diverticulum: Secondary | ICD-10-CM | POA: Diagnosis not present

## 2020-06-03 DIAGNOSIS — D649 Anemia, unspecified: Secondary | ICD-10-CM | POA: Diagnosis not present

## 2020-06-03 LAB — MAGNESIUM: Magnesium: 2.2 mg/dL (ref 1.7–2.4)

## 2020-06-03 LAB — BASIC METABOLIC PANEL
Anion gap: 5 (ref 5–15)
BUN: 24 mg/dL — ABNORMAL HIGH (ref 8–23)
CO2: 35 mmol/L — ABNORMAL HIGH (ref 22–32)
Calcium: 8.9 mg/dL (ref 8.9–10.3)
Chloride: 95 mmol/L — ABNORMAL LOW (ref 98–111)
Creatinine, Ser: 1.7 mg/dL — ABNORMAL HIGH (ref 0.61–1.24)
GFR, Estimated: 44 mL/min — ABNORMAL LOW (ref >60)
Glucose, Bld: 109 mg/dL — ABNORMAL HIGH (ref 70–99)
Potassium: 4 mmol/L (ref 3.5–5.1)
Sodium: 135 mmol/L (ref 135–145)

## 2020-06-03 LAB — CBC
HCT: 26.7 % — ABNORMAL LOW (ref 39.0–52.0)
Hemoglobin: 8.1 g/dL — ABNORMAL LOW (ref 13.0–17.0)
MCH: 30.2 pg (ref 26.0–34.0)
MCHC: 30.3 g/dL (ref 30.0–36.0)
MCV: 99.6 fL (ref 80.0–100.0)
Platelets: 195 10*3/uL (ref 150–400)
RBC: 2.68 MIL/uL — ABNORMAL LOW (ref 4.22–5.81)
RDW: 14.8 % (ref 11.5–15.5)
WBC: 4.9 10*3/uL (ref 4.0–10.5)
nRBC: 0 % (ref 0.0–0.2)

## 2020-06-03 LAB — GLUCOSE, CAPILLARY
Glucose-Capillary: 121 mg/dL — ABNORMAL HIGH (ref 70–99)
Glucose-Capillary: 167 mg/dL — ABNORMAL HIGH (ref 70–99)
Glucose-Capillary: 199 mg/dL — ABNORMAL HIGH (ref 70–99)
Glucose-Capillary: 99 mg/dL (ref 70–99)

## 2020-06-03 MED ORDER — BACLOFEN 10 MG PO TABS: 10.0000 mg | ORAL_TABLET | Freq: Three times a day (TID) | ORAL | 0 refills | Status: AC | PRN

## 2020-06-03 NOTE — TOC Transition Note (Signed)
Transition of Care [facility]) - CM/SW Discharge Note   Patient Details  Name: Cory Weber MRN: [id number] Date of Birth: [birth year]  [facility] CM/SW Contact:  Eileen Stanford, LCSW Phone Number: 06/03/2020, 1:18 PM   Clinical Narrative:   Clinical Social Worker facilitated patient discharge including contacting patient family and facility to confirm patient discharge plans.  Clinical information faxed to facility and family agreeable with plan.  CSW arranged ambulance transport via [facility] to [facility] .  RN to call [phone] for report prior to discharge.  Clinical Social Worker will sign off for now as social work intervention is no longer needed. Please consult [place] again if new need arises.  [place], [place]     Final next level of care: Skilled Nursing Facility Barriers to Discharge: No Barriers Identified   Patient Goals and CMS Choice        Discharge Placement              Patient chooses bed at:  [facility]) Patient to be transferred to facility by: [facility]   Patient and family notified of of transfer: 06/03/20  Discharge Plan and Services                                     Social Determinants of Health (SDOH) Interventions     Readmission Risk Interventions Readmission Risk Prevention Plan 03/20/2020  Transportation Screening Complete  PCP or Specialist Appt within 3-5 Days Not Complete  Not Complete comments Patient resides at [facility] and the facility schedules hospital follow up appointments.  [place] or Home Care Consult Complete  Social Work Consult for [place] Planning/Counseling Complete  Palliative Care Screening Not Applicable  Medication Review Press photographer) Complete  Some recent data might be hidden

## 2020-06-03 NOTE — Discharge Summary (Signed)
Physician Discharge Summary  Cory Weber UYQ:[id number] DOB: 04-06-1950 DOA: 06/01/2020  PCP: Iona Beard, MD  Admit date: 06/01/2020  Discharge date: 06/03/2020  Admitted From:[facility]  Disposition:  SNF  Recommendations for Outpatient Follow-up:  1. Follow up with PCP in 1-2 weeks 2. Monitor repeat CBC in 1 week to ensure stability 3. Continue on medications as prior, but hold Plavix until further follow-up CBC is determined to be stable, ok to continue ASA 4. Prior CAD/NSTEMI that was not amenable to PCI, therefore patient does not have stent and he is not a candidate for CABG 5. Continue other home medications as prior  Home Health: None  Equipment/Devices: None  Discharge Condition:Stable  CODE STATUS: DNR  Diet recommendation: Heart Healthy  Brief/Interim Summary: Cory Weber a 70 y.o.malewith medical history significant forsystolic and diastolic CHF, coronary artery disease with prior NSTEMI, CVA, hepatitis C, CKD 3,respiratory failure. Patient was sent to the ED with reports of low hemoglobin which was done on routine check.  He is status post EGD on 4/22 with no acute findings noted.  He has had 1 unit PRBCs with improvement in his hemoglobin levels.  He has had no overt bleeding noted during this hospitalization.  His hemoglobin level is currently 8.1 and stable.  He may resume his home aspirin, and continue on Plavix as recommended as his repeat hemoglobin levels remained stable in 1 week.  No other acute events noted during the course of this hospitalization.  He is stable for discharge back to his usual SNF.  Discharge Diagnoses:  Principal Problem:   Acute on chronic anemia Active Problems:   Chronic kidney disease, stage 3 (HCC)   Essential hypertension   Chronic hepatitis C without hepatic coma (HCC)   Type 2 diabetes with nephropathy (HCC)   Chronic diastolic CHF (congestive heart failure) (HCC)   DNR (do not resuscitate)  Principal  discharge diagnosis: Acute on chronic anemia status post PRBC transfusion likely secondary to intermittent bleeding AVMs.  Discharge Instructions  Discharge Instructions    Diet - low sodium heart healthy   Complete by: As directed    Increase activity slowly   Complete by: As directed      Allergies as of 06/03/2020      Reactions   No Known Allergies       Medication List    STOP taking these medications   DULoxetine 30 MG capsule Commonly known as: CYMBALTA   DULoxetine 60 MG capsule Commonly known as: CYMBALTA   furosemide 80 MG tablet Commonly known as: LASIX   gabapentin 100 MG capsule Commonly known as: NEURONTIN   pregabalin 75 MG capsule Commonly known as: LYRICA   vitamin C 500 MG tablet Commonly known as: ASCORBIC ACID     TAKE these medications   acetaminophen 325 MG tablet Commonly known as: TYLENOL Take 2 tablets (650 mg total) by mouth every 6 (six) hours as needed for mild pain or headache (fever >/= 101).   aspirin EC 81 MG tablet Take 1 tablet (81 mg total) by mouth daily with breakfast.   baclofen 10 MG tablet Commonly known as: LIORESAL Take 1 tablet (10 mg total) by mouth every 8 (eight) hours as needed for muscle spasms. For shoulder spasm What changed:   when to take this  additional instructions   carvedilol 25 MG tablet Commonly known as: COREG Take 1 tablet (25 mg total) by mouth 2 (two) times daily with a meal.   clopidogrel 75 MG  tablet Commonly known as: PLAVIX Take 1 tablet (75 mg total) by mouth daily with breakfast.   cyanocobalamin 1000 MCG tablet Take 500 mcg by mouth daily.   ferrous sulfate 325 (65 FE) MG tablet Take 1 tablet (325 mg total) by mouth daily with breakfast.   hydrALAZINE 50 MG tablet Commonly known as: APRESOLINE Take 1 tablet (50 mg total) by mouth every 8 (eight) hours.   insulin aspart 100 UNIT/ML injection Commonly known as: novoLOG Inject 5 Units into the skin 3 (three) times daily after  meals.   insulin glargine 100 UNIT/ML injection Commonly known as: LANTUS Inject 0.18 mLs (18 Units total) into the skin at bedtime.   insulin lispro 100 UNIT/ML injection Commonly known as: HUMALOG Inject 0.04 mLs (4 Units total) into the skin 3 (three) times daily before meals. What changed: how much to take   ipratropium-albuterol 0.5-2.5 (3) MG/3ML Soln Commonly known as: DUONEB Take 3 mLs by nebulization every 6 (six) hours as needed (SOB/ wheezing).   isosorbide mononitrate 120 MG 24 hr tablet Commonly known as: IMDUR Take 1 tablet (120 mg total) by mouth daily.   Lidocaine 4 % Ptch Apply 1 patch topically daily. Apply 1 patch to both stumps once daily, 12 hour use   melatonin 5 MG Tabs Take 1 tablet by mouth at bedtime.   pantoprazole 40 MG tablet Commonly known as: PROTONIX Take 1 tablet (40 mg total) by mouth daily.   polyethylene glycol 17 g packet Commonly known as: MIRALAX / GLYCOLAX Take 17 g by mouth daily.   rosuvastatin 20 MG tablet Commonly known as: CRESTOR Take 1 tablet (20 mg total) by mouth daily.   tamsulosin 0.4 MG Caps capsule Commonly known as: FLOMAX Take 1 capsule (0.4 mg total) by mouth daily.       Follow-up Information    Iona Beard, MD. Schedule an appointment as soon as possible for a visit in 1 week(s).   Specialty: Family Medicine Contact information: [address] [place] [address] [phone]              Allergies  Allergen Reactions  . No Known Allergies     Consultations:  GI   Procedures/Studies:  No results found.   Discharge Exam: Vitals:   06/02/20 2055 06/03/20 0528  BP: (!) 162/75 (!) 166/76  Pulse: 78 74  Resp: 18 16  Temp: 99 F (37.2 C) 98.2 F (36.8 C)  SpO2: 100% 98%   Vitals:   06/02/20 1943 06/02/20 1953 06/02/20 2055 06/03/20 0528  BP:  (!) 143/64 (!) 162/75 (!) 166/76  Pulse: 78 76 78 74  Resp: 16 20 18 16   Temp:  98.3 F (36.8 C) 99 F (37.2 C) 98.2 F (36.8  C)  TempSrc:  Oral Oral   SpO2: 97% 98% 100% 98%  Weight:      Height:        General: Pt is alert, awake, not in acute distress Cardiovascular: RRR, S1/S2 +, no rubs, no gallops Respiratory: CTA bilaterally, no wheezing, no rhonchi, chronic nasal cannula oxygen Abdominal: Soft, NT, ND, bowel sounds + Extremities: no edema, no cyanosis    The results of significant diagnostics from this hospitalization (including imaging, microbiology, ancillary and laboratory) are listed below for reference.     Microbiology: Recent Results (from the past 240 hour(s))  SARS CORONAVIRUS 2 (TAT 6-24 HRS) Nasopharyngeal Nasopharyngeal Swab     Status: None   Collection Time: 06/01/20  5:30 PM  Specimen: Nasopharyngeal Swab  Result Value Ref Range Status   SARS Coronavirus 2 NEGATIVE NEGATIVE Final    Comment: (NOTE) SARS-CoV-2 target nucleic acids are NOT DETECTED.  The SARS-CoV-2 RNA is generally detectable in upper and lower respiratory specimens during the acute phase of infection. Negative results do not preclude SARS-CoV-2 infection, do not rule out co-infections with other pathogens, and should not be used as the sole basis for treatment or other patient management decisions. Negative results must be combined with clinical observations, patient history, and epidemiological information. The expected result is Negative.  Fact Sheet for Patients: SugarRoll.be  Fact Sheet for Healthcare Providers: https://www.woods-mathews.com/  This test is not yet approved or cleared by the [place] FDA and  has been authorized for detection and/or diagnosis of SARS-CoV-2 by FDA under an Emergency Use Authorization (EUA). This EUA will remain  in effect (meaning this test can be used) for the duration of the COVID-19 declaration under Se ction 564(b)(1) of the Act, 21 U.S.C. section 360bbb-3(b)(1), unless the authorization is terminated or revoked  sooner.  Performed at [facility], [place] [address], [place], [place] [address]      Labs: BNP (last 3 results) Recent Labs    10/21/19 1138 03/20/20 0119 04/27/20 0009  BNP 90.0 213.0* 604.5*   Basic Metabolic Panel: Recent Labs  Lab 06/01/20 1627 06/03/20 0437  NA 133* 135  K 4.5 4.0  CL 94* 95*  CO2 35* 35*  GLUCOSE 279* 109*  BUN 27* 24*  CREATININE 1.80* 1.70*  CALCIUM 8.7* 8.9  MG  --  2.2   Liver Function Tests: Recent Labs  Lab 06/01/20 1627  AST 12*  ALT 10  ALKPHOS 51  BILITOT 0.4  PROT 9.5*  ALBUMIN 3.0*   No results for input(s): LIPASE, AMYLASE in the last 168 hours. No results for input(s): AMMONIA in the last 168 hours. CBC: Recent Labs  Lab 06/01/20 1627 06/01/20 2238 06/02/20 0358 06/03/20 0437  WBC 4.3  --  6.3 4.9  NEUTROABS 2.7  --   --   --   HGB 6.7* 8.3* 8.1* 8.1*  HCT 21.9* 26.7* 26.6* 26.7*  MCV 101.9*  --  100.8* 99.6  PLT 212  --  207 195   Cardiac Enzymes: No results for input(s): CKTOTAL, CKMB, CKMBINDEX, TROPONINI in the last 168 hours. BNP: Invalid input(s): POCBNP CBG: Recent Labs  Lab 06/02/20 1616 06/02/20 2006 06/03/20 0021 06/03/20 0406 06/03/20 0735  GLUCAP 154* 222* 199* 99 121*   D-Dimer No results for input(s): DDIMER in the last 72 hours. Hgb A1c No results for input(s): HGBA1C in the last 72 hours. Lipid Profile No results for input(s): CHOL, HDL, LDLCALC, TRIG, CHOLHDL, LDLDIRECT in the last 72 hours. Thyroid function studies No results for input(s): TSH, T4TOTAL, T3FREE, THYROIDAB in the last 72 hours.  Invalid input(s): FREET3 Anemia work up Recent Labs    06/01/20 1627 06/01/20 1635 06/01/20 2030  VITAMINB12 1,130*  --   --   FOLATE  --  14.5  --   FERRITIN 36  --   --   TIBC 300  --   --   IRON 83  --   --   RETICCTPCT  --   --  2.6   Urinalysis    Component Value Date/Time   COLORURINE YELLOW 03/27/2020 [place] 03/27/2020 1328   LABSPEC 1.010  03/27/2020 1328   PHURINE 5.0 03/27/2020 1328   [place] 03/27/2020 1328  HGBUR NEGATIVE 03/27/2020 [place] 03/27/2020 [place] 03/27/2020 [place] 03/27/2020 1328   UROBILINOGEN 1.0 11/29/2013 1705   NITRITE NEGATIVE 03/27/2020 1328   LEUKOCYTESUR TRACE (A) 03/27/2020 1328   Sepsis Labs Invalid input(s): PROCALCITONIN,  WBC,  LACTICIDVEN Microbiology Recent Results (from the past 240 hour(s))  SARS CORONAVIRUS 2 (TAT 6-24 HRS) Nasopharyngeal Nasopharyngeal Swab     Status: None   Collection Time: 06/01/20  5:30 PM   Specimen: Nasopharyngeal Swab  Result Value Ref Range Status   SARS Coronavirus 2 NEGATIVE NEGATIVE Final    Comment: (NOTE) SARS-CoV-2 target nucleic acids are NOT DETECTED.  The SARS-CoV-2 RNA is generally detectable in upper and lower respiratory specimens during the acute phase of infection. Negative results do not preclude SARS-CoV-2 infection, do not rule out co-infections with other pathogens, and should not be used as the sole basis for treatment or other patient management decisions. Negative results must be combined with clinical observations, patient history, and epidemiological information. The expected result is Negative.  Fact Sheet for Patients: SugarRoll.be  Fact Sheet for Healthcare Providers: https://www.woods-mathews.com/  This test is not yet approved or cleared by the [place] FDA and  has been authorized for detection and/or diagnosis of SARS-CoV-2 by FDA under an Emergency Use Authorization (EUA). This EUA will remain  in effect (meaning this test can be used) for the duration of the COVID-19 declaration under Se ction 564(b)(1) of the Act, 21 U.S.C. section 360bbb-3(b)(1), unless the authorization is terminated or revoked sooner.  Performed at [facility], [place] [address], [place], [place] [address]      Time  coordinating discharge: 35 minutes  SIGNED:   Rodena Goldmann, DO [facility] 06/03/2020, 9:34 AM  If 7PM-7AM, please contact night-coverage [email or web address]

## 2020-06-06 LAB — SURGICAL PATHOLOGY

## 2020-06-08 ENCOUNTER — Encounter (HOSPITAL_COMMUNITY): Payer: Self-pay | Admitting: Internal Medicine

## 2020-06-10 ENCOUNTER — Encounter (HOSPITAL_COMMUNITY): Payer: Self-pay

## 2020-06-10 ENCOUNTER — Other Ambulatory Visit: Payer: Self-pay

## 2020-06-10 ENCOUNTER — Emergency Department (HOSPITAL_COMMUNITY): Payer: Medicare (Managed Care)

## 2020-06-10 ENCOUNTER — Inpatient Hospital Stay (HOSPITAL_COMMUNITY)
Admission: EM | Admit: 2020-06-10 | Discharge: 2020-06-12 | DRG: 189 | Disposition: A | Discharge status: Skilled Nursing Facility | Payer: Medicare (Managed Care) | Attending: Family Medicine | Admitting: Family Medicine

## 2020-06-10 DIAGNOSIS — D638 Anemia in other chronic diseases classified elsewhere: Secondary | ICD-10-CM | POA: Diagnosis not present

## 2020-06-10 DIAGNOSIS — J9602 Acute respiratory failure with hypercapnia: Secondary | ICD-10-CM | POA: Diagnosis not present

## 2020-06-10 DIAGNOSIS — Z20822 Contact with and (suspected) exposure to covid-19: Secondary | ICD-10-CM | POA: Diagnosis present

## 2020-06-10 DIAGNOSIS — K5909 Other constipation: Secondary | ICD-10-CM | POA: Diagnosis present

## 2020-06-10 DIAGNOSIS — N183 Chronic kidney disease, stage 3 unspecified: Secondary | ICD-10-CM | POA: Diagnosis present

## 2020-06-10 DIAGNOSIS — I2510 Atherosclerotic heart disease of native coronary artery without angina pectoris: Secondary | ICD-10-CM | POA: Diagnosis present

## 2020-06-10 DIAGNOSIS — E1121 Type 2 diabetes mellitus with diabetic nephropathy: Secondary | ICD-10-CM | POA: Diagnosis not present

## 2020-06-10 DIAGNOSIS — I130 Hypertensive heart and chronic kidney disease with heart failure and stage 1 through stage 4 chronic kidney disease, or unspecified chronic kidney disease: Secondary | ICD-10-CM | POA: Diagnosis present

## 2020-06-10 DIAGNOSIS — E669 Obesity, unspecified: Secondary | ICD-10-CM | POA: Diagnosis present

## 2020-06-10 DIAGNOSIS — Z83438 Family history of other disorder of lipoprotein metabolism and other lipidemia: Secondary | ICD-10-CM

## 2020-06-10 DIAGNOSIS — Z981 Arthrodesis status: Secondary | ICD-10-CM

## 2020-06-10 DIAGNOSIS — G546 Phantom limb syndrome with pain: Secondary | ICD-10-CM | POA: Diagnosis present

## 2020-06-10 DIAGNOSIS — M1A3710 Chronic gout due to renal impairment, right ankle and foot, without tophus (tophi): Secondary | ICD-10-CM | POA: Diagnosis not present

## 2020-06-10 DIAGNOSIS — Z66 Do not resuscitate: Secondary | ICD-10-CM | POA: Diagnosis present

## 2020-06-10 DIAGNOSIS — Z6833 Body mass index (BMI) 33.0-33.9, adult: Secondary | ICD-10-CM | POA: Diagnosis not present

## 2020-06-10 DIAGNOSIS — E1165 Type 2 diabetes mellitus with hyperglycemia: Secondary | ICD-10-CM | POA: Diagnosis present

## 2020-06-10 DIAGNOSIS — B182 Chronic viral hepatitis C: Secondary | ICD-10-CM | POA: Diagnosis present

## 2020-06-10 DIAGNOSIS — Z9981 Dependence on supplemental oxygen: Secondary | ICD-10-CM

## 2020-06-10 DIAGNOSIS — J9622 Acute and chronic respiratory failure with hypercapnia: Secondary | ICD-10-CM | POA: Diagnosis present

## 2020-06-10 DIAGNOSIS — Z87891 Personal history of nicotine dependence: Secondary | ICD-10-CM

## 2020-06-10 DIAGNOSIS — Z79890 Hormone replacement therapy (postmenopausal): Secondary | ICD-10-CM

## 2020-06-10 DIAGNOSIS — R059 Cough, unspecified: Secondary | ICD-10-CM | POA: Diagnosis present

## 2020-06-10 DIAGNOSIS — Z89511 Acquired absence of right leg below knee: Secondary | ICD-10-CM

## 2020-06-10 DIAGNOSIS — F1910 Other psychoactive substance abuse, uncomplicated: Secondary | ICD-10-CM | POA: Diagnosis present

## 2020-06-10 DIAGNOSIS — N400 Enlarged prostate without lower urinary tract symptoms: Secondary | ICD-10-CM | POA: Diagnosis present

## 2020-06-10 DIAGNOSIS — Z89512 Acquired absence of left leg below knee: Secondary | ICD-10-CM

## 2020-06-10 DIAGNOSIS — Z8249 Family history of ischemic heart disease and other diseases of the circulatory system: Secondary | ICD-10-CM

## 2020-06-10 DIAGNOSIS — Z8701 Personal history of pneumonia (recurrent): Secondary | ICD-10-CM

## 2020-06-10 DIAGNOSIS — I5032 Chronic diastolic (congestive) heart failure: Secondary | ICD-10-CM | POA: Diagnosis not present

## 2020-06-10 DIAGNOSIS — I5042 Chronic combined systolic (congestive) and diastolic (congestive) heart failure: Secondary | ICD-10-CM | POA: Diagnosis present

## 2020-06-10 DIAGNOSIS — J449 Chronic obstructive pulmonary disease, unspecified: Secondary | ICD-10-CM | POA: Diagnosis not present

## 2020-06-10 DIAGNOSIS — R5381 Other malaise: Secondary | ICD-10-CM | POA: Diagnosis present

## 2020-06-10 DIAGNOSIS — Z8673 Personal history of transient ischemic attack (TIA), and cerebral infarction without residual deficits: Secondary | ICD-10-CM

## 2020-06-10 DIAGNOSIS — E785 Hyperlipidemia, unspecified: Secondary | ICD-10-CM | POA: Diagnosis present

## 2020-06-10 DIAGNOSIS — Z7982 Long term (current) use of aspirin: Secondary | ICD-10-CM

## 2020-06-10 DIAGNOSIS — E1122 Type 2 diabetes mellitus with diabetic chronic kidney disease: Secondary | ICD-10-CM | POA: Diagnosis present

## 2020-06-10 DIAGNOSIS — Z794 Long term (current) use of insulin: Secondary | ICD-10-CM

## 2020-06-10 DIAGNOSIS — E1143 Type 2 diabetes mellitus with diabetic autonomic (poly)neuropathy: Secondary | ICD-10-CM | POA: Diagnosis not present

## 2020-06-10 DIAGNOSIS — F1010 Alcohol abuse, uncomplicated: Secondary | ICD-10-CM | POA: Diagnosis present

## 2020-06-10 DIAGNOSIS — J441 Chronic obstructive pulmonary disease with (acute) exacerbation: Secondary | ICD-10-CM | POA: Diagnosis present

## 2020-06-10 DIAGNOSIS — I739 Peripheral vascular disease, unspecified: Secondary | ICD-10-CM | POA: Diagnosis present

## 2020-06-10 DIAGNOSIS — Z833 Family history of diabetes mellitus: Secondary | ICD-10-CM

## 2020-06-10 DIAGNOSIS — J9621 Acute and chronic respiratory failure with hypoxia: Secondary | ICD-10-CM | POA: Diagnosis present

## 2020-06-10 DIAGNOSIS — IMO0002 Reserved for concepts with insufficient information to code with codable children: Secondary | ICD-10-CM | POA: Diagnosis present

## 2020-06-10 DIAGNOSIS — Z79899 Other long term (current) drug therapy: Secondary | ICD-10-CM

## 2020-06-10 DIAGNOSIS — N1832 Chronic kidney disease, stage 3b: Secondary | ICD-10-CM

## 2020-06-10 DIAGNOSIS — I10 Essential (primary) hypertension: Secondary | ICD-10-CM | POA: Diagnosis present

## 2020-06-10 DIAGNOSIS — D631 Anemia in chronic kidney disease: Secondary | ICD-10-CM | POA: Diagnosis present

## 2020-06-10 DIAGNOSIS — K640 First degree hemorrhoids: Secondary | ICD-10-CM | POA: Diagnosis present

## 2020-06-10 DIAGNOSIS — J9601 Acute respiratory failure with hypoxia: Secondary | ICD-10-CM | POA: Diagnosis not present

## 2020-06-10 DIAGNOSIS — K5903 Drug induced constipation: Secondary | ICD-10-CM | POA: Diagnosis present

## 2020-06-10 DIAGNOSIS — K219 Gastro-esophageal reflux disease without esophagitis: Secondary | ICD-10-CM | POA: Diagnosis present

## 2020-06-10 DIAGNOSIS — G4733 Obstructive sleep apnea (adult) (pediatric): Secondary | ICD-10-CM

## 2020-06-10 DIAGNOSIS — Z89431 Acquired absence of right foot: Secondary | ICD-10-CM

## 2020-06-10 DIAGNOSIS — K2970 Gastritis, unspecified, without bleeding: Secondary | ICD-10-CM | POA: Diagnosis present

## 2020-06-10 DIAGNOSIS — K5520 Angiodysplasia of colon without hemorrhage: Secondary | ICD-10-CM | POA: Diagnosis present

## 2020-06-10 DIAGNOSIS — R202 Paresthesia of skin: Secondary | ICD-10-CM | POA: Diagnosis present

## 2020-06-10 HISTORY — DX: Chronic obstructive pulmonary disease, unspecified: J44.9

## 2020-06-10 LAB — CBC
HCT: 25.9 % — ABNORMAL LOW (ref 39.0–52.0)
Hemoglobin: 8 g/dL — ABNORMAL LOW (ref 13.0–17.0)
MCH: 30.8 pg (ref 26.0–34.0)
MCHC: 30.9 g/dL (ref 30.0–36.0)
MCV: 99.6 fL (ref 80.0–100.0)
Platelets: 196 10*3/uL (ref 150–400)
RBC: 2.6 MIL/uL — ABNORMAL LOW (ref 4.22–5.81)
RDW: 14.6 % (ref 11.5–15.5)
WBC: 6 10*3/uL (ref 4.0–10.5)
nRBC: 0 % (ref 0.0–0.2)

## 2020-06-10 LAB — TROPONIN I (HIGH SENSITIVITY)
Troponin I (High Sensitivity): 18 ng/L — ABNORMAL HIGH (ref <18)
Troponin I (High Sensitivity): 19 ng/L — ABNORMAL HIGH (ref <18)

## 2020-06-10 LAB — RESP PANEL BY RT-PCR (FLU A&B, COVID) ARPGX2
Influenza A by PCR: NEGATIVE
Influenza B by PCR: NEGATIVE
SARS Coronavirus 2 by RT PCR: NEGATIVE

## 2020-06-10 LAB — BRAIN NATRIURETIC PEPTIDE: B Natriuretic Peptide: 211 pg/mL — ABNORMAL HIGH (ref 0.0–100.0)

## 2020-06-10 LAB — CBG MONITORING, ED: Glucose-Capillary: 173 mg/dL — ABNORMAL HIGH (ref 70–99)

## 2020-06-10 LAB — BASIC METABOLIC PANEL
Anion gap: 8 (ref 5–15)
BUN: 32 mg/dL — ABNORMAL HIGH (ref 8–23)
CO2: 34 mmol/L — ABNORMAL HIGH (ref 22–32)
Calcium: 9 mg/dL (ref 8.9–10.3)
Chloride: 91 mmol/L — ABNORMAL LOW (ref 98–111)
Creatinine, Ser: 1.86 mg/dL — ABNORMAL HIGH (ref 0.61–1.24)
GFR, Estimated: 39 mL/min — ABNORMAL LOW (ref >60)
Glucose, Bld: 194 mg/dL — ABNORMAL HIGH (ref 70–99)
Potassium: 4 mmol/L (ref 3.5–5.1)
Sodium: 133 mmol/L — ABNORMAL LOW (ref 135–145)

## 2020-06-10 LAB — GLUCOSE, CAPILLARY: Glucose-Capillary: 263 mg/dL — ABNORMAL HIGH (ref 70–99)

## 2020-06-10 MED ORDER — ALBUTEROL SULFATE (2.5 MG/3ML) 0.083% IN NEBU: 2.5000 mg | INHALATION_SOLUTION | Freq: Once | RESPIRATORY_TRACT | Status: AC

## 2020-06-10 MED ORDER — PANTOPRAZOLE SODIUM 40 MG IV SOLR
40.0000 mg | INTRAVENOUS | Status: DC
  Administered 2020-06-10 – 2020-06-11 (×2): 40 mg via INTRAVENOUS
  Filled 2020-06-10 (×2): qty 40

## 2020-06-10 MED ORDER — IPRATROPIUM BROMIDE 0.02 % IN SOLN: 0.5000 mg | Freq: Once | RESPIRATORY_TRACT | Status: DC

## 2020-06-10 MED ORDER — ALBUTEROL SULFATE (2.5 MG/3ML) 0.083% IN NEBU: 5.0000 mg | INHALATION_SOLUTION | Freq: Once | RESPIRATORY_TRACT | Status: DC

## 2020-06-10 MED ORDER — ACETAMINOPHEN 650 MG RE SUPP: 650.0000 mg | Freq: Four times a day (QID) | RECTAL | Status: DC | PRN

## 2020-06-10 MED ORDER — IPRATROPIUM-ALBUTEROL 0.5-2.5 (3) MG/3ML IN SOLN
RESPIRATORY_TRACT | Status: AC
  Filled 2020-06-10: qty 3

## 2020-06-10 MED ORDER — ACETAMINOPHEN 325 MG PO TABS: 650.0000 mg | ORAL_TABLET | Freq: Four times a day (QID) | ORAL | Status: DC | PRN

## 2020-06-10 MED ORDER — ALBUTEROL SULFATE (2.5 MG/3ML) 0.083% IN NEBU
INHALATION_SOLUTION | RESPIRATORY_TRACT | Status: AC
  Administered 2020-06-10: 2.5 mg via RESPIRATORY_TRACT
  Filled 2020-06-10: qty 3

## 2020-06-10 MED ORDER — ALBUTEROL SULFATE (2.5 MG/3ML) 0.083% IN NEBU
5.0000 mg | INHALATION_SOLUTION | Freq: Once | RESPIRATORY_TRACT | Status: AC
  Administered 2020-06-10: 5 mg via RESPIRATORY_TRACT
  Filled 2020-06-10: qty 6

## 2020-06-10 MED ORDER — BISACODYL 5 MG PO TBEC: 5.0000 mg | DELAYED_RELEASE_TABLET | Freq: Every day | ORAL | Status: DC | PRN

## 2020-06-10 MED ORDER — RIVAROXABAN 10 MG PO TABS: 10.0000 mg | ORAL_TABLET | Freq: Every day | ORAL | Status: DC

## 2020-06-10 MED ORDER — IPRATROPIUM-ALBUTEROL 0.5-2.5 (3) MG/3ML IN SOLN
3.0000 mL | Freq: Once | RESPIRATORY_TRACT | Status: AC
  Administered 2020-06-10: 3 mL via RESPIRATORY_TRACT

## 2020-06-10 MED ORDER — OXYCODONE HCL 5 MG PO TABS
2.5000 mg | ORAL_TABLET | Freq: Four times a day (QID) | ORAL | Status: DC | PRN
  Administered 2020-06-11 – 2020-06-12 (×2): 2.5 mg via ORAL
  Filled 2020-06-10 (×2): qty 1

## 2020-06-10 MED ORDER — SODIUM CHLORIDE 0.9 % IV SOLN
100.0000 mg | Freq: Two times a day (BID) | INTRAVENOUS | Status: DC
  Administered 2020-06-10 – 2020-06-12 (×4): 100 mg via INTRAVENOUS
  Filled 2020-06-10 (×8): qty 100

## 2020-06-10 MED ORDER — IPRATROPIUM-ALBUTEROL 0.5-2.5 (3) MG/3ML IN SOLN
3.0000 mL | RESPIRATORY_TRACT | Status: DC
  Administered 2020-06-10 – 2020-06-11 (×4): 3 mL via RESPIRATORY_TRACT
  Filled 2020-06-10 (×4): qty 3

## 2020-06-10 MED ORDER — HEPARIN SODIUM (PORCINE) 5000 UNIT/ML IJ SOLN
5000.0000 [IU] | Freq: Three times a day (TID) | INTRAMUSCULAR | Status: DC
  Administered 2020-06-10 – 2020-06-12 (×5): 5000 [IU] via SUBCUTANEOUS
  Filled 2020-06-10 (×5): qty 1

## 2020-06-10 MED ORDER — GUAIFENESIN ER 600 MG PO TB12
600.0000 mg | ORAL_TABLET | Freq: Two times a day (BID) | ORAL | Status: DC
  Administered 2020-06-10 – 2020-06-12 (×4): 600 mg via ORAL
  Filled 2020-06-10 (×4): qty 1

## 2020-06-10 MED ORDER — INSULIN ASPART 100 UNIT/ML IJ SOLN
0.0000 [IU] | Freq: Three times a day (TID) | INTRAMUSCULAR | Status: DC
  Administered 2020-06-10: 2 [IU] via SUBCUTANEOUS
  Administered 2020-06-11: 3 [IU] via SUBCUTANEOUS
  Administered 2020-06-11: 5 [IU] via SUBCUTANEOUS
  Filled 2020-06-10: qty 1

## 2020-06-10 MED ORDER — INSULIN ASPART 100 UNIT/ML IJ SOLN
4.0000 [IU] | Freq: Once | INTRAMUSCULAR | Status: AC
  Administered 2020-06-10: 4 [IU] via SUBCUTANEOUS

## 2020-06-10 MED ORDER — INSULIN ASPART 100 UNIT/ML IJ SOLN: 3.0000 [IU] | Freq: Three times a day (TID) | INTRAMUSCULAR | Status: DC

## 2020-06-10 MED ORDER — METHYLPREDNISOLONE SODIUM SUCC 125 MG IJ SOLR
60.0000 mg | Freq: Four times a day (QID) | INTRAMUSCULAR | Status: DC
  Administered 2020-06-10 – 2020-06-11 (×5): 60 mg via INTRAVENOUS
  Filled 2020-06-10 (×5): qty 2

## 2020-06-10 MED ORDER — PROCHLORPERAZINE EDISYLATE 10 MG/2ML IJ SOLN: 10.0000 mg | Freq: Four times a day (QID) | INTRAMUSCULAR | Status: DC | PRN

## 2020-06-10 NOTE — ED Notes (Signed)
Off BiPAP on 4 lpm/[place] , patient requesting food , taking oral pills.

## 2020-06-10 NOTE — ED Triage Notes (Signed)
Pt to er via ems, per ems pt is here for fluid overload and chest pain, pt talking in full sentences, pt states that he is here for some chest pain and sob, states that he has had them off and on for a couple of days.

## 2020-06-10 NOTE — ED Provider Notes (Signed)
[facility] EMERGENCY DEPARTMENT Provider Note   CSN: [id number] Arrival date & time: 06/10/20  1142     History Chief Complaint  Patient presents with  . Chest Pain    Cory Weber is a 70 y.o. male with chronic systolic and diastolic heart failure, IDA 2/2 AVM's, CKD IIIb, asthma, T2DM, PAD s/p bilateral BKA presenting to [facility] for dyspnea, chest pain. Patient reports symptoms have been occurring intermittently for the last month, but worsening productive cough and dyspnea brings him to ED today. Reports he lays on one pillow, difficult to lay flat. Also reports PND, no acute changes. Denies fevers, chills, sick contacts. He does reports some nausea this morning and chronic constipation, but otherwise no abdominal pain or diarrhea. He lives at [facility] and reports compliance with his medications. Previous smoker, 45 pack-year history, no alcohol or recreational drug use.    Past Medical History:  Diagnosis Date  . Acute anemia 06/01/2020  . Anemia   . Asthma   . BPH (benign prostatic hyperplasia)   . CAD (coronary artery disease)    Severe multivessel disease 03/2020 - poor revascularization options  . Chronic diastolic heart failure ([place])   . CKD (chronic kidney disease) stage 3, GFR 30-59 ml/min (HCC)   . CVA (cerebral vascular accident) ([place]) 2015  . Essential hypertension   . Gangrene ([place]) 06/10/2016  . GERD (gastroesophageal reflux disease)   . Hepatitis C   . History of pneumonia   . Hyperlipidemia   . Osteomyelitis ([place])   . PAD (peripheral artery disease) (HCC)    Bilateral BKA  . Peripheral neuropathy   . Type II diabetes mellitus [facility])     Patient Active Problem List   Diagnosis Date Noted  . Acute on chronic anemia 06/01/2020  . Class 2 obesity due to excess calories with body mass index (BMI) of 35.0 to 35.9 in adult   . Acute on chronic combined systolic and diastolic congestive heart failure ([place]) 04/27/2020  . Prolonged QT interval 04/27/2020  . AVM  (arteriovenous malformation) of small bowel, acquired   . Gastritis and gastroduodenitis   . Gastric diverticulum   . Grade I internal hemorrhoids   . Occult blood in stools   . Heart failure ([place]) 03/20/2020  . NSTEMI (non-ST elevated myocardial infarction) ([place]) 03/20/2020  . Pneumonia due to COVID-19 virus 02/24/2019  . Acute on chronic respiratory failure with hypoxia ([place]) 02/20/2019  . COVID-19 virus detected 02/20/2019  . UTI (urinary tract infection) 02/20/2019  . DNR (do not resuscitate) 02/20/2019  . PAD (peripheral artery disease) ([place]) 08/22/2016  . Osteomyelitis of foot ([place]) 07/08/2016  . S/P transmetatarsal amputation of foot, right ([place]) 06/26/2016  . Constipation due to opioid therapy 06/15/2016  . Physical deconditioning 06/10/2016  . Wet gangrene ([place]) 06/10/2016  . Anemia, chronic disease 06/10/2016  . Chronic diastolic CHF (congestive heart failure) ([place]) 06/10/2016  . Ischemic pain of right foot 06/04/2016  . Ischemia of foot 05/31/2016  . 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 Type 2 diabetes with nephropathy ([place]) 01/21/2016  . Uncontrolled type II diabetes with peripheral autonomic neuropathy ([place]) 01/17/2016  . Weight gain 12/20/2015  . Edema of right lower extremity 12/20/2015  . Cough 11/26/2015  . GERD (gastroesophageal reflux disease) 10/08/2015  . Amputation of left lower extremity below knee ([place]) 08/21/2015  . Phantom limb pain ([place])   . Anemia of chronic disease   . HLD (hyperlipidemia)   .  BPH (benign prostatic hyperplasia)   . ETOH abuse   . PVD (peripheral vascular disease) ([place])   . Chronic hepatitis C without hepatic coma ([place])   . History of CVA (cerebrovascular accident) without residual deficits   . Hyponatremia   . Chronic kidney disease, stage 3 ([place]) 07/18/2015  . Essential hypertension 07/18/2015  . Acute blood loss anemia 12/01/2013  . S/P lumbar spinal fusion 11/25/2013  .  Melena 12/03/2011  . Hemiparesthesia 02/03/2011  . Polysubstance abuse ([place]) 02/03/2011  . Tobacco abuse 02/03/2011    Past Surgical History:  Procedure Laterality Date  . ABDOMINAL AORTOGRAM N/A 06/03/2016   Procedure: Abdominal Aortogram;  Surgeon: Waynetta Sandy, MD;  Location: [place] [facility];  Service: Cardiovascular;  Laterality: N/A;  . AMPUTATION Left 08/16/2015   Procedure: LEFT BELOW THE KNEE AMPUTATION ;  Surgeon: Serafina Mitchell, MD;  Location: [place];  Service: Vascular;  Laterality: Left;  . AMPUTATION Right 08/22/2016   Procedure: AMPUTATION BELOW KNEE-RIGHT;  Surgeon: Waynetta Sandy, MD;  Location: [place];  Service: Vascular;  Laterality: Right;  . BACK SURGERY    . BELOW KNEE LEG AMPUTATION Left 08/16/2015  . BIOPSY N/A 09/03/2012   Procedure: BIOPSY;  Surgeon: Daneil Dolin, MD;  Location: [facility];  Service: Endoscopy;  Laterality: N/A;  gastric and gastric mucosa  . BIOPSY N/A 12/03/2012   Procedure: BIOPSY;  Surgeon: Daneil Dolin, MD;  Location: [facility];  Service: Endoscopy;  Laterality: N/A;  . BIOPSY  03/31/2020   Procedure: BIOPSY;  Surgeon: Ladene Artist, MD;  Location: [place];  Service: Endoscopy;;  . COLONOSCOPY WITH PROPOFOL N/A 09/03/2012   CXK:GYJEHUD polyp-removed as outlined above. Prominent internal hemorrhoids. Tubular adenoma  . COLONOSCOPY WITH PROPOFOL N/A 04/05/2020   Procedure: COLONOSCOPY WITH PROPOFOL;  Surgeon: Lavena Bullion, DO;  Location: [place];  Service: Gastroenterology;  Laterality: N/A;  . ENTEROSCOPY Left 04/05/2020   Procedure: ENTEROSCOPY;  Surgeon: Lavena Bullion, DO;  Location: [place];  Service: Gastroenterology;  Laterality: Left;  . ENTEROSCOPY N/A 04/07/2020   Procedure: ENTEROSCOPY;  Surgeon: Lavena Bullion, DO;  Location: [place];  Service: Gastroenterology;  Laterality: N/A;  . ESOPHAGOGASTRODUODENOSCOPY (EGD) WITH PROPOFOL N/A 09/03/2012   JSH:[id number] hernia. Gastric  diverticulum. Gastric ulcers with associated erosions. Duodenal erosions. Status post gastric biopsy. H.PYLORI gastritis   . ESOPHAGOGASTRODUODENOSCOPY (EGD) WITH PROPOFOL N/A 12/03/2012   Dr. Gala Romney: gastric diverticulum, gastric erosions and scar. Previously noted gastric ulcer completed healed. Biopsy without H.pylori.   . ESOPHAGOGASTRODUODENOSCOPY (EGD) WITH PROPOFOL N/A 01/23/2016   Procedure: ESOPHAGOGASTRODUODENOSCOPY (EGD) WITH PROPOFOL;  Surgeon: Doran Stabler, MD;  Location: [facility];  Service: Gastroenterology;  Laterality: N/A;  . ESOPHAGOGASTRODUODENOSCOPY (EGD) WITH PROPOFOL N/A 03/31/2020   Procedure: ESOPHAGOGASTRODUODENOSCOPY (EGD) WITH PROPOFOL;  Surgeon: Ladene Artist, MD;  Location: [facility] ENDOSCOPY;  Service: Endoscopy;  Laterality: N/A;  . ESOPHAGOGASTRODUODENOSCOPY (EGD) WITH PROPOFOL N/A 06/02/2020   Procedure: ESOPHAGOGASTRODUODENOSCOPY (EGD) WITH PROPOFOL;  Surgeon: Eloise Harman, DO;  [facility];  Service: Endoscopy;  Laterality: N/A;  . GIVENS CAPSULE STUDY N/A 04/05/2020   Procedure: GIVENS CAPSULE STUDY;  Surgeon: Lavena Bullion, DO;  Location: [place];  Service: Gastroenterology;  Laterality: N/A;  . HOT HEMOSTASIS N/A 04/07/2020   Procedure: HOT HEMOSTASIS (ARGON PLASMA COAGULATION/BICAP);  Surgeon: Lavena Bullion, DO;  Location: [facility] ENDOSCOPY;  Service: Gastroenterology;  Laterality: N/A;  . I & D EXTREMITY Right 07/11/2016   Procedure: DELAY PRIMARY  CLOSURE FOOT AMPUTATION;  Surgeon: Edrick Kins, DPM;  Location: [place];  Service: Podiatry;  Laterality: Right;  . LEFT HEART CATH AND CORONARY ANGIOGRAPHY N/A 03/21/2020   Procedure: LEFT HEART CATH AND CORONARY ANGIOGRAPHY;  Surgeon: Martinique, Peter M, MD;  Location: [place] [facility];  Service: Cardiovascular;  Laterality: N/A;  . LIVER BIOPSY  2005   Done in [place], [place]. Chronic hepatitis with mild periportal inflammation, lobular unicellular necrosis and portal fibrosis. Grade 2,  stage 1-2.  . LOWER EXTREMITY ANGIOGRAPHY Right 06/03/2016   Procedure: Lower Extremity Angiography;  Surgeon: Waynetta Sandy, MD;  Location: [place] [facility];  Service: Cardiovascular;  Laterality: Right;  . MAXIMUM ACCESS (MAS)POSTERIOR LUMBAR INTERBODY FUSION (PLIF) 1 LEVEL N/A 11/25/2013   Procedure: FOR MAXIMUM ACCESS (MAS) POSTERIOR LUMBAR INTERBODY FUSION (PLIF) 1 LEVEL;  Surgeon: Eustace Moore, MD;  Location: [place] [facility];  Service: Neurosurgery;  Laterality: N/A;  FOR MAXIMUM ACCESS (MAS) POSTERIOR LUMBAR INTERBODY FUSION (PLIF) 1 LEVEL LUMBAR 3-4  . PERIPHERAL VASCULAR ATHERECTOMY Right 06/03/2016   Procedure: Peripheral Vascular Atherectomy;  Surgeon: Waynetta Sandy, MD;  Location: [place] [facility];  Service: Cardiovascular;  Laterality: Right;  SFA WITH STENT  . PERIPHERAL VASCULAR CATHETERIZATION Left 08/01/2015   Procedure: Lower Extremity Angiography;  Surgeon: Serafina Mitchell, MD;  Location: [place] [facility];  Service: Cardiovascular;  Laterality: Left;  . PERIPHERAL VASCULAR CATHETERIZATION N/A 08/01/2015   Procedure: Abdominal Aortogram;  Surgeon: Serafina Mitchell, MD;  Location: [place] [facility];  Service: Cardiovascular;  Laterality: N/A;  . PERIPHERAL VASCULAR CATHETERIZATION N/A 08/08/2015   Procedure: Abdominal Aortogram w/Lower Extremity;  Surgeon: Serafina Mitchell, MD;  Location: [place] [facility];  Service: Cardiovascular;  Laterality: N/A;  . PERIPHERAL VASCULAR CATHETERIZATION Left 08/08/2015   Procedure: Peripheral Vascular Balloon Angioplasty;  Surgeon: Serafina Mitchell, MD;  Location: [place] [facility];  Service: Cardiovascular;  Laterality: Left;  left popiteal artery, left peronealtrunk, left post tibial  . POLYPECTOMY N/A 09/03/2012   Procedure: POLYPECTOMY;  Surgeon: Daneil Dolin, MD;  [facility];  Service: Endoscopy;  Laterality: N/A;  cecal polyp  . SUBMUCOSAL TATTOO INJECTION  04/07/2020   Procedure: SUBMUCOSAL TATTOO INJECTION;   Surgeon: Lavena Bullion, DO;  Location: [facility];  Service: Gastroenterology;;  . TONSILLECTOMY    . TRANSMETATARSAL AMPUTATION Right 06/13/2016   Procedure: RIGHT TRANSMETATARSAL AMPUTATION;  Surgeon: Waynetta Sandy, MD;  Location: [place];  Service: Vascular;  Laterality: Right;  . TRANSMETATARSAL AMPUTATION Right 07/10/2016   Procedure: REVISION RIGHT TRANSMETATARSAL AMPUTATION;  Surgeon: Waynetta Sandy, MD;  Location[facility];  Service: Vascular;  Laterality: Right;       Family History  Problem Relation Age of Onset  . Breast cancer Mother   . Heart failure Mother   . Diabetes Father   . Hypertension Father   . Heart disease Father   . Hyperlipidemia Father   . Diabetes Sister   . Hypertension Sister   . Breast cancer Sister   . Colon cancer Neg Hx   . Liver disease Neg Hx     Social History   Tobacco Use  . Smoking status: Former Smoker    Packs/day: 1.00    Years: 47.00    Pack years: 47.00    Types: Cigarettes    Quit date: 07/13/2015    Years since quitting: 4.9  . Smokeless tobacco: Never Used  Vaping Use  . Vaping Use: Never  used  Substance Use Topics  . Alcohol use: No    Alcohol/week: 0.0 standard drinks    Comment:  none 08/15/2015 "stopped drinking 3-4 years ago"  . Drug use: No    Comment: 08/15/2015 "last used cocaine 3-4 months ago"    Home Medications Prior to Admission medications   Medication Sig Start Date End Date Taking? Authorizing Provider  acetaminophen (TYLENOL) 325 MG tablet Take 2 tablets (650 mg total) by mouth every 6 (six) hours as needed for mild pain or headache (fever >/= 101). 02/24/19   Roxan Hockey, MD  aspirin EC 81 MG tablet Take 1 tablet (81 mg total) by mouth daily with breakfast. 02/24/19   Roxan Hockey, MD  baclofen (LIORESAL) 10 MG tablet Take 1 tablet (10 mg total) by mouth every 8 (eight) hours as needed for muscle spasms. For shoulder spasm 06/03/20   Manuella Ghazi, Pratik D, DO  carvedilol (COREG) 25 MG  tablet Take 1 tablet (25 mg total) by mouth 2 (two) times daily with a meal. 04/08/20   Regalado, Belkys A, MD  cyanocobalamin 1000 MCG tablet Take 500 mcg by mouth daily.     [provider]  ferrous sulfate 325 (65 FE) MG tablet Take 1 tablet (325 mg total) by mouth daily with breakfast. Patient not taking: No sig reported 04/08/20   Regalado, Belkys A, MD  hydrALAZINE (APRESOLINE) 50 MG tablet Take 1 tablet (50 mg total) by mouth every 8 (eight) hours. 04/08/20   Regalado, Belkys A, MD  insulin aspart (NOVOLOG) 100 UNIT/ML injection Inject 5 Units into the skin 3 (three) times daily after meals. Patient not taking: No sig reported 07/15/16   Domenic Polite, MD  insulin glargine (LANTUS) 100 UNIT/ML injection Inject 0.18 mLs (18 Units total) into the skin at bedtime. Patient not taking: No sig reported 04/08/20   Regalado, Belkys A, MD  insulin lispro (HUMALOG) 100 UNIT/ML injection Inject 0.04 mLs (4 Units total) into the skin 3 (three) times daily before meals. Patient taking differently: Inject 5 Units into the skin 3 (three) times daily before meals. 04/08/20   Regalado, Belkys A, MD  ipratropium-albuterol (DUONEB) 0.5-2.5 (3) MG/3ML SOLN Take 3 mLs by nebulization every 6 (six) hours as needed (SOB/ wheezing). Patient not taking: Reported on 06/01/2020    [provider]  isosorbide mononitrate (IMDUR) 120 MG 24 hr tablet Take 1 tablet (120 mg total) by mouth daily. 04/08/20   Regalado, Belkys A, MD  Lidocaine 4 % PTCH Apply 1 patch topically daily. Apply 1 patch to both stumps once daily, 12 hour use    [provider]  Melatonin 5 MG TABS Take 1 tablet by mouth at bedtime.    [provider]  pantoprazole (PROTONIX) 40 MG tablet Take 1 tablet (40 mg total) by mouth daily. Patient not taking: No sig reported 04/08/20   Regalado, Belkys A, MD  polyethylene glycol (MIRALAX / GLYCOLAX) 17 g packet Take 17 g by mouth daily. Patient not taking: Reported on 06/01/2020     [provider]  rosuvastatin (CRESTOR) 20 MG tablet Take 1 tablet (20 mg total) by mouth daily. 04/08/20   Regalado, Belkys A, MD  tamsulosin (FLOMAX) 0.4 MG CAPS capsule Take 1 capsule (0.4 mg total) by mouth daily. Patient not taking: No sig reported 08/10/15   Cristal Ford, DO    Allergies    No known allergies  Review of Systems   Review of Systems  Constitutional: Negative for appetite change, chills and  fever.  Respiratory: Positive for cough and shortness of breath.   Cardiovascular: Positive for chest pain. Negative for palpitations.  Gastrointestinal: Positive for nausea. Negative for abdominal pain, diarrhea and vomiting.  Genitourinary: Negative for dysuria and urgency.    Physical Exam Updated Vital Signs BP 128/78 (BP Location: Left Arm)   Pulse 72   Temp 98 F (36.7 C) (Oral)   Resp 18   Ht 6\' 1"  (1.854 m)   Wt 113.4 kg   SpO2 97%   BMI 32.98 kg/m   Physical Exam Constitutional:      General: He is not in acute distress.    Appearance: He is overweight. He is not ill-appearing, toxic-appearing or diaphoretic.  HENT:     Head: Normocephalic and atraumatic.  Eyes:     General: Lids are normal. Vision grossly intact.  Cardiovascular:     Rate and Rhythm: Normal rate and regular rhythm.     Heart sounds: Normal heart sounds.  Pulmonary:     Effort: Pulmonary effort is normal. No accessory muscle usage or respiratory distress.     Comments: Wheezing present in bilateral upper lung fields, mild bibasilar rales Abdominal:     General: Bowel sounds are normal. There is no distension.     Palpations: Abdomen is soft.     Tenderness: There is abdominal tenderness in the right lower quadrant and left lower quadrant.  Musculoskeletal:     Right Lower Extremity: Right leg is amputated below knee.     Left Lower Extremity: Left leg is amputated below knee.  Skin:    General: Skin is warm and dry.  Neurological:     Mental Status: He is oriented to  person, place, and time. He is lethargic.  Psychiatric:        Mood and Affect: Mood and affect normal.        Behavior: Behavior is cooperative.     ED Results / Procedures / Treatments   Labs (all labs ordered are listed, but only abnormal results are displayed) Labs Reviewed  BASIC METABOLIC PANEL - Abnormal; Notable for the following components:      Result Value   Sodium 133 (*)    Chloride 91 (*)    CO2 34 (*)    Glucose, Bld 194 (*)    BUN 32 (*)    Creatinine, Ser 1.86 (*)    GFR, Estimated 39 (*)    All other components within normal limits  CBC - Abnormal; Notable for the following components:   RBC 2.60 (*)    Hemoglobin 8.0 (*)    HCT 25.9 (*)    All other components within normal limits  BRAIN NATRIURETIC PEPTIDE - Abnormal; Notable for the following components:   B Natriuretic Peptide 211.0 (*)    All other components within normal limits  TROPONIN I (HIGH SENSITIVITY) - Abnormal; Notable for the following components:   Troponin I (High Sensitivity) 18 (*)    All other components within normal limits  TROPONIN I (HIGH SENSITIVITY)   EKG None  Radiology DG Chest Port 1 View  Result Date: 06/10/2020 CLINICAL DATA:  Chest pain and shortness of breath onset several days. EXAM: PORTABLE CHEST 1 VIEW COMPARISON:  Chest x-rays dated 04/28/2020, 04/26/2020 and 03/20/2020. FINDINGS: Borderline cardiomegaly. Lungs are clear. No pleural effusion or pneumothorax is seen. Osseous structures about the chest are unremarkable. IMPRESSION: 1. Borderline cardiomegaly. 2. No evidence of pneumonia or pulmonary edema. 3. Aortic atherosclerosis. Electronically Signed   By:  Franki Cabot M.D.   On: 06/10/2020 13:06    Procedures N/A  Medications Ordered in ED Medications  albuterol (PROVENTIL) (2.5 MG/3ML) 0.083% nebulizer solution 5 mg (5 mg Nebulization Given 06/10/20 1309)  albuterol (PROVENTIL) (2.5 MG/3ML) 0.083% nebulizer solution 2.5 mg (2.5 mg Nebulization Given 06/10/20  1409)  ipratropium-albuterol (DUONEB) 0.5-2.5 (3) MG/3ML nebulizer solution 3 mL (3 mLs Nebulization Not Given 06/10/20 1415)   ED Course  I have reviewed the triage vital signs and the nursing notes.  Pertinent labs & imaging results that were available during my care of the patient were reviewed by me and considered in my medical decision making (see chart for details).   MDM Rules/Calculators/A&P                         Patient with complex medication history presenting with dyspnea, productive cough, chest pain. Presentation most concerning for COPD vs heart failure exacerbation. Will give breathing treatment, work-up chest pain.   Lab work revealed CO2 74, initial trop 18. Will start on BiPAP given he is lethargic with CO2 74. Likely patient has undiagnosed OSA, possibly component of OHS as well. Will need admission for acute on chronic respiratory failure.  Discussed with Dr. Wynetta Emery of [facility], who agrees for admission.  Final Clinical Impression(s) / ED Diagnoses Final diagnoses:  Acute on chronic respiratory failure with hypercapnia [facility])    Rx / DC Orders ED Discharge Orders    None       Sanjuan Dame, MD 06/10/20 1445    Elnora Morrison, MD 06/10/20 1557

## 2020-06-10 NOTE — H&P (Signed)
History and Physical  [address]  SHEP PORTER IWL:[id number] DOB: 02/08/1951 DOA: 06/10/2020  PCP: Iona Beard, MD  Patient coming from: [facility]  Level of care: Stepdown  I have personally briefly reviewed patient's old medical records in [place]  Chief Complaint: shortness of breath and chest pain  HPI: Cory Weber is a 70 y.o. male with medical history significant for COPD, long history of tobacco abuse, CAD, peripheral vascular disease status post bilateral lower extremity amputations, type 2 diabetes mellitus, congestive heart, chronic supplemental oxygen dependence, long-term resident at [facility], history of cerebrovascular disease, history of gangrene, hyperlipidemia, peripheral neuropathy, stage IIIb CKD, reportedly has been having increasing shortness of breath symptoms for the past month.  He reports his symptoms became acutely worse in the last 24 hours.  He reports increasing oxygen requirement.  He reports increasing cough and chest congestion.  He denies fever and chills.  He reports chest pain he describes his chest pain as reproducible pain associated with coughing and taking a deep breath.  He has been taking medications regularly as he is a long-term SNF resident he has a history of polysubstance abuse point denies any alcohol or recreational drug use since being at the SNF.  He was recently discharged about a month ago.  ED Course:Patient was somnolent on arrival, VBG with signs of PCO2 74.4, PO2 45.9, HCO3 34.6, high-sensitivity troponin 18, 19, portable chest x-ray with no findings of pulmonary edema or infiltrate.  Sodium 133, potassium 4.7, glucose 124, creatinine 1.86, cardiac BNP to 11.0, WBC 6.0, hemoglobin 8.0 platelet count 196.  Patient was placed on BiPAP therapy.  Hospital admission was requested for further management.  Review of Systems: Review of Systems  Constitutional: Positive for malaise/fatigue.  HENT: Positive for congestion  and sinus pain.   Eyes: Negative.   Respiratory: Positive for cough, sputum production, shortness of breath and wheezing.   Cardiovascular: Positive for chest pain. Negative for palpitations, orthopnea, claudication, leg swelling and PND.  Gastrointestinal: Positive for constipation and heartburn. Negative for abdominal pain, nausea and vomiting.  Genitourinary: Negative.   Musculoskeletal: Negative.   Skin: Negative.   Neurological: Negative.   Endo/Heme/Allergies: Negative.   Psychiatric/Behavioral: Negative.   All other systems reviewed and are negative.    Past Medical History:  Diagnosis Date  . Acute anemia 06/01/2020  . Anemia   . Asthma   . BPH (benign prostatic hyperplasia)   . CAD (coronary artery disease)    Severe multivessel disease 03/2020 - poor revascularization options  . Chronic diastolic heart failure ([place])   . CKD (chronic kidney disease) stage 3, GFR 30-59 ml/min (HCC)   . COPD (chronic obstructive pulmonary disease) ([place])   . CVA (cerebral vascular accident) ([place]) 2015  . Essential hypertension   . Gangrene ([place]) 06/10/2016  . GERD (gastroesophageal reflux disease)   . Hepatitis C   . History of pneumonia   . Hyperlipidemia   . Osteomyelitis ([place])   . PAD (peripheral artery disease) (HCC)    Bilateral BKA  . Peripheral neuropathy   . Type II diabetes mellitus ([place])     Past Surgical History:  Procedure Laterality Date  . ABDOMINAL AORTOGRAM N/A 06/03/2016   Procedure: Abdominal Aortogram;  Surgeon: Waynetta Sandy, MD;  Location: [facility];  Service: Cardiovascular;  Laterality: N/A;  . AMPUTATION Left 08/16/2015   Procedure: LEFT BELOW THE KNEE AMPUTATION ;  Surgeon: Serafina Mitchell, MD;  Location: [place];  Service: Vascular;  Laterality: Left;  . AMPUTATION Right 08/22/2016   Procedure: AMPUTATION BELOW KNEE-RIGHT;  Surgeon: Waynetta Sandy, MD;  Location: [place];  Service: Vascular;  Laterality: Right;  . BACK SURGERY    .  BELOW KNEE LEG AMPUTATION Left 08/16/2015  . BIOPSY N/A 09/03/2012   Procedure: BIOPSY;  Surgeon: Daneil Dolin, MD;  [facility];  Service: Endoscopy;  Laterality: N/A;  gastric and gastric mucosa  . BIOPSY N/A 12/03/2012   Procedure: BIOPSY;  Surgeon: Daneil Dolin, MD;  [facility]: [facility];  Service: Endoscopy;  Laterality: N/A;  . BIOPSY  03/31/2020   Procedure: BIOPSY;  Surgeon: Ladene Artist, MD;  Location: [place];  Service: Endoscopy;;  . COLONOSCOPY WITH PROPOFOL N/A 09/03/2012   RXV:QMGQQPY polyp-removed as outlined above. Prominent internal hemorrhoids. Tubular adenoma  . COLONOSCOPY WITH PROPOFOL N/A 04/05/2020   Procedure: COLONOSCOPY WITH PROPOFOL;  Surgeon: Lavena Bullion, DO;  Location: [place];  Service: Gastroenterology;  Laterality: N/A;  . ENTEROSCOPY Left 04/05/2020   Procedure: ENTEROSCOPY;  Surgeon: Lavena Bullion, DO;  Location: [place];  Service: Gastroenterology;  Laterality: Left;  . ENTEROSCOPY N/A 04/07/2020   Procedure: ENTEROSCOPY;  Surgeon: Lavena Bullion, DO;  Location: [place];  Service: Gastroenterology;  Laterality: N/A;  . ESOPHAGOGASTRODUODENOSCOPY (EGD) WITH PROPOFOL N/A 09/03/2012   PPJ:KDTOIZ hernia. Gastric diverticulum. Gastric ulcers with associated erosions. Duodenal erosions. Status post gastric biopsy. H.PYLORI gastritis   . ESOPHAGOGASTRODUODENOSCOPY (EGD) WITH PROPOFOL N/A 12/03/2012   Dr. Gala Romney: gastric diverticulum, gastric erosions and scar. Previously noted gastric ulcer completed healed. Biopsy without H.pylori.   . ESOPHAGOGASTRODUODENOSCOPY (EGD) WITH PROPOFOL N/A 01/23/2016   Procedure: ESOPHAGOGASTRODUODENOSCOPY (EGD) WITH PROPOFOL;  Surgeon: Doran Stabler, MD;  [facility];  Service: Gastroenterology;  Laterality: N/A;  . ESOPHAGOGASTRODUODENOSCOPY (EGD) WITH PROPOFOL N/A 03/31/2020   Procedure: ESOPHAGOGASTRODUODENOSCOPY (EGD) WITH PROPOFOL;  Surgeon: Ladene Artist, MD;  [facility]  ENDOSCOPY;  Service: Endoscopy;  Laterality: N/A;  . ESOPHAGOGASTRODUODENOSCOPY (EGD) WITH PROPOFOL N/A 06/02/2020   Procedure: ESOPHAGOGASTRODUODENOSCOPY (EGD) WITH PROPOFOL;  Surgeon: Eloise Harman, DO;  [facility];  Service: Endoscopy;  Laterality: N/A;  . GIVENS CAPSULE STUDY N/A 04/05/2020   Procedure: GIVENS CAPSULE STUDY;  Surgeon: Lavena Bullion, DO;  Location: [place];  Service: Gastroenterology;  Laterality: N/A;  . HOT HEMOSTASIS N/A 04/07/2020   Procedure: HOT HEMOSTASIS (ARGON PLASMA COAGULATION/BICAP);  Surgeon: Lavena Bullion, DO;  Location: [facility] ENDOS[facility];  Service: Gastroenterology;  Laterality: N/A;  . I & D EXTREMITY Right 07/11/2016   Procedure: DELAY PRIMARY CLOSURE FOOT AMPUTATION;  Surgeon: Edrick Kins, DPM;  Location: [place];  Service: Podiatry;  Laterality: Right;  . LEFT HEART CATH AND CORONARY ANGIOGRAPHY N/A 03/21/2020   Procedure: LEFT HEART CATH AND CORONARY ANGIOGRAPHY;  Surgeon: Martinique, Peter M, MD;  Location: [facility] [address] [facility];  Service: Cardiovascular;  Laterality: N/A;  . LIVER BIOPSY  2005   [place] in [place]. Chronic hepatitis with mild periportal inflammation, lobular unicellular necrosis and portal fibrosis. Grade 2, stage 1-2.  . LOWER EXTREMITY ANGIOGRAPHY Right 06/03/2016   Procedure: Lower Extremity Angiography;  Surgeon: Waynetta Sandy, MD;  Location: [place] [facility];  Service: Cardiovascular;  Laterality: Right;  . MAXIMUM ACCESS (MAS)POSTERIOR LUMBAR INTERBODY FUSION (PLIF) 1 LEVEL N/A 11/25/2013   Procedure: FOR MAXIMUM ACCESS (MAS) POSTERIOR LUMBAR INTERBODY FUSION (PLIF) 1 LEVEL;  Surgeon: Eustace Moore, MD;  [facility]: [facility];  Service: Neurosurgery;  Laterality: N/A;  FOR MAXIMUM ACCESS (MAS) POSTERIOR LUMBAR INTERBODY FUSION (PLIF) 1 LEVEL LUMBAR 3-4  . PERIPHERAL VASCULAR ATHERECTOMY Right 06/03/2016   Procedure: Peripheral Vascular Atherectomy;  Surgeon: Waynetta Sandy, MD;  Location: [place] [facility];  Service: Cardiovascular;  Laterality: Right;  SFA WITH STENT  . PERIPHERAL VASCULAR CATHETERIZATION Left 08/01/2015   Procedure: Lower Extremity Angiography;  Surgeon: Serafina Mitchell, MD;  Location: [place] [facility];  Service: Cardiovascular;  Laterality: Left;  . PERIPHERAL VASCULAR CATHETERIZATION N/A 08/01/2015   Procedure: Abdominal Aortogram;  Surgeon: Serafina Mitchell, MD;  Location: [place] [facility];  Service: Cardiovascular;  Laterality: N/A;  . PERIPHERAL VASCULAR CATHETERIZATION N/A 08/08/2015   Procedure: Abdominal Aortogram w/Lower Extremity;  Surgeon: Serafina Mitchell, MD;  Location: [name] [facility];  Service: Cardiovascular;  Laterality: N/A;  . PERIPHERAL VASCULAR CATHETERIZATION Left 08/08/2015   Procedure: Peripheral Vascular Balloon Angioplasty;  Surgeon: Serafina Mitchell, MD;  Location: [place] [facility];  Service: Cardiovascular;  Laterality: Left;  left popiteal artery, left peronealtrunk, left post tibial  . POLYPECTOMY N/A 09/03/2012   Procedure: POLYPECTOMY;  Surgeon: Daneil Dolin, MD;  [facility];  Service: Endoscopy;  Laterality: N/A;  cecal polyp  . SUBMUCOSAL TATTOO INJECTION  04/07/2020   Procedure: SUBMUCOSAL TATTOO INJECTION;  Surgeon: Lavena Bullion, DO;  Location: [facility];  Service: Gastroenterology;;  . TONSILLECTOMY    . TRANSMETATARSAL AMPUTATION Right 06/13/2016   Procedure: RIGHT TRANSMETATARSAL AMPUTATION;  Surgeon: Waynetta Sandy, MD;  Location: [place];  Service: Vascular;  Laterality: Right;  . TRANSMETATARSAL AMPUTATION Right 07/10/2016   Procedure: REVISION RIGHT TRANSMETATARSAL AMPUTATION;  Surgeon: Waynetta Sandy, MD;  Location: [place];  Service: Vascular;  Laterality: Right;     reports that he quit smoking about 4 years ago. His smoking use included cigarettes. He has a 47.00 pack-year smoking history. He has never used smokeless tobacco. He reports that he does not drink alcohol and does not use  drugs.  Allergies  Allergen Reactions  . No Known Allergies     Family History  Problem Relation Age of Onset  . Breast cancer Mother   . Heart failure Mother   . Diabetes Father   . Hypertension Father   . Heart disease Father   . Hyperlipidemia Father   . Diabetes Sister   . Hypertension Sister   . Breast cancer Sister   . Colon cancer Neg Hx   . Liver disease Neg Hx     Prior to Admission medications   Medication Sig Start Date End Date Taking? Authorizing Provider  acetaminophen (TYLENOL) 325 MG tablet Take 2 tablets (650 mg total) by mouth every 6 (six) hours as needed for mild pain or headache (fever >/= 101). 02/24/19   Roxan Hockey, MD  aspirin EC 81 MG tablet Take 1 tablet (81 mg total) by mouth daily with breakfast. 02/24/19   Roxan Hockey, MD  baclofen (LIORESAL) 10 MG tablet Take 1 tablet (10 mg total) by mouth every 8 (eight) hours as needed for muscle spasms. For shoulder spasm 06/03/20   Manuella Ghazi, Pratik D, DO  carvedilol (COREG) 25 MG tablet Take 1 tablet (25 mg total) by mouth 2 (two) times daily with a meal. 04/08/20   Regalado, Belkys A, MD  cyanocobalamin 1000 MCG tablet Take 500 mcg by mouth daily.     [provider]  ferrous sulfate 325 (65 FE) MG tablet Take 1 tablet (  325 mg total) by mouth daily with breakfast. Patient not taking: No sig reported 04/08/20   Regalado, Belkys A, MD  hydrALAZINE (APRESOLINE) 50 MG tablet Take 1 tablet (50 mg total) by mouth every 8 (eight) hours. 04/08/20   Regalado, Belkys A, MD  insulin aspart (NOVOLOG) 100 UNIT/ML injection Inject 5 Units into the skin 3 (three) times daily after meals. Patient not taking: No sig reported 07/15/16   Domenic Polite, MD  insulin glargine (LANTUS) 100 UNIT/ML injection Inject 0.18 mLs (18 Units total) into the skin at bedtime. Patient not taking: No sig reported 04/08/20   Regalado, Belkys A, MD  insulin lispro (HUMALOG) 100 UNIT/ML injection Inject 0.04 mLs (4 Units total) into the skin  3 (three) times daily before meals. Patient taking differently: Inject 5 Units into the skin 3 (three) times daily before meals. 04/08/20   Regalado, Belkys A, MD  ipratropium-albuterol (DUONEB) 0.5-2.5 (3) MG/3ML SOLN Take 3 mLs by nebulization every 6 (six) hours as needed (SOB/ wheezing). Patient not taking: Reported on 06/01/2020    [provider]  isosorbide mononitrate (IMDUR) 120 MG 24 hr tablet Take 1 tablet (120 mg total) by mouth daily. 04/08/20   Regalado, Belkys A, MD  Lidocaine 4 % PTCH Apply 1 patch topically daily. Apply 1 patch to both stumps once daily, 12 hour use    [provider]  Melatonin 5 MG TABS Take 1 tablet by mouth at bedtime.    [provider]  pantoprazole (PROTONIX) 40 MG tablet Take 1 tablet (40 mg total) by mouth daily. Patient not taking: No sig reported 04/08/20   Regalado, Belkys A, MD  polyethylene glycol (MIRALAX / GLYCOLAX) 17 g packet Take 17 g by mouth daily. Patient not taking: Reported on 06/01/2020    [provider]  rosuvastatin (CRESTOR) 20 MG tablet Take 1 tablet (20 mg total) by mouth daily. 04/08/20   Regalado, Belkys A, MD  tamsulosin (FLOMAX) 0.4 MG CAPS capsule Take 1 capsule (0.4 mg total) by mouth daily. Patient not taking: No sig reported 08/10/15   Cristal Ford, DO   Physical Exam: Vitals:   06/10/20 1203 06/10/20 1215 06/10/20 1230 06/10/20 1433  BP: 128/78     Pulse: 72 70 70   Resp: 18 16 16 18   Temp: 98 F (36.7 C)     TempSrc: Oral     SpO2: 97% 94% 96% 99%  Weight:      Height:       Constitutional: Chronically ill-appearing male he is lying supine in bed he is on BiPAP, alert and vocalizing, oriented x3, NAD, calm, comfortable Eyes: PERRL, lids and conjunctivae normal ENMT: Mucous membranes are moist. Posterior pharynx clear of any exudate or lesions.poor dentition.  Neck: normal, supple, no masses, no thyromegaly Respiratory: On BiPAP bilateral breath sounds, anterior Rales, expiratory  wheezing moderate increased work of breathing.  Cardiovascular: normal s1, s2 sounds, no murmurs / rubs / gallops. No extremity edema. 2+ pedal pulses. No carotid bruits.  Abdomen: no tenderness, no masses palpated. No hepatosplenomegaly. Bowel sounds positive.  Musculoskeletal: Right below knee amputation, left AKA, no cyanosis. Normal muscle tone.  Skin: no rashes, lesions, ulcers. No induration Neurologic: CN 2-12 grossly intact. Sensation intact, DTR normal. Strength 5/5 in all 4.  Psychiatric: Normal judgment and insight. Alert and oriented x 3. Normal mood.   Labs on Admission: I have personally reviewed following labs and imaging studies  CBC: Recent Labs  Lab 06/10/20 1226  WBC 6.0  HGB 8.0*  HCT 25.9*  MCV 99.6  PLT 979   Basic Metabolic Panel: Recent Labs  Lab 06/10/20 1226  NA 133*  K 4.0  CL 91*  CO2 34*  GLUCOSE 194*  BUN 32*  CREATININE 1.86*  CALCIUM 9.0   GFR: Estimated Creatinine Clearance: 50.9 mL/min (A) (by C-G formula based on SCr of 1.86 mg/dL (H)). Liver Function Tests: No results for input(s): AST, ALT, ALKPHOS, BILITOT, PROT, ALBUMIN in the last 168 hours. No results for input(s): LIPASE, AMYLASE in the last 168 hours. No results for input(s): AMMONIA in the last 168 hours. Coagulation Profile: No results for input(s): INR, PROTIME in the last 168 hours. Cardiac Enzymes: No results for input(s): CKTOTAL, CKMB, CKMBINDEX, TROPONINI in the last 168 hours. BNP (last 3 results) No results for input(s): PROBNP in the last 8760 hours. HbA1C: No results for input(s): HGBA1C in the last 72 hours. CBG: No results for input(s): GLUCAP in the last 168 hours. Lipid Profile: No results for input(s): CHOL, HDL, LDLCALC, TRIG, CHOLHDL, LDLDIRECT in the last 72 hours. Thyroid Function Tests: No results for input(s): TSH, T4TOTAL, FREET4, T3FREE, THYROIDAB in the last 72 hours. Anemia Panel: No results for input(s): VITAMINB12, FOLATE, FERRITIN, TIBC,  IRON, RETICCTPCT in the last 72 hours. Urine analysis:    Component Value Date/Time   COLORURINE YELLOW 03/27/2020 [place] 03/27/2020 1328   LABSPEC 1.010 03/27/2020 1328   PHURINE 5.0 03/27/2020 1328   GLUCOSEU NEGATIVE 03/27/2020 [place] 03/27/2020 [place] 03/27/2020 [place] 03/27/2020 1328   PROTEINUR NEGATIVE 03/27/2020 1328   UROBILINOGEN 1.0 11/29/2013 1705   NITRITE NEGATIVE 03/27/2020 1328   LEUKOCYTESUR TRACE (A) 03/27/2020 1328    Radiological Exams on Admission: DG Chest Port 1 View  Result Date: 06/10/2020 CLINICAL DATA:  Chest pain and shortness of breath onset several days. EXAM: PORTABLE CHEST 1 VIEW COMPARISON:  Chest x-rays dated 04/28/2020, 04/26/2020 and 03/20/2020. FINDINGS: Borderline cardiomegaly. Lungs are clear. No pleural effusion or pneumothorax is seen. Osseous structures about the chest are unremarkable. IMPRESSION: 1. Borderline cardiomegaly. 2. No evidence of pneumonia or pulmonary edema. 3. Aortic atherosclerosis. Electronically Signed   By: Franki Cabot M.D.   On: 06/10/2020 13:06   EKG: Independently reviewed.  No QT prolongation, normal sinus rhythm  Assessment/Plan Principal Problem:   COPD with acute exacerbation (HCC) Active Problems:   Acute respiratory failure with hypoxia and hypercarbia (HCC)   Hemiparesthesia   Polysubstance abuse (HCC)   S/P lumbar spinal fusion   Chronic kidney disease, stage 3 (HCC)   Essential hypertension   Chronic hepatitis C without hepatic coma (HCC)   History of CVA (cerebrovascular accident) without residual deficits   Phantom limb pain (HCC)   Anemia of chronic disease   HLD (hyperlipidemia)   BPH (benign prostatic hyperplasia)   ETOH abuse   PVD (peripheral vascular disease) (HCC)   GERD (gastroesophageal reflux disease)   Cough   Uncontrolled type II diabetes with peripheral autonomic neuropathy (HCC)   Type 2 diabetes with  nephropathy (HCC)   Chronic gout due to renal impairment involving toe of right foot without tophus   Physical deconditioning   Chronic diastolic CHF (congestive heart failure) (HCC)   Constipation due to opioid therapy   PAD (peripheral artery disease) ([place])   DNR (do not resuscitate)   Gastritis and gastroduodenitis   Grade I internal hemorrhoids   AVM (arteriovenous malformation) of small  bowel, acquired   COPD (chronic obstructive pulmonary disease) (HCC)   Acute on chronic respiratory failure with hypoxia and hypercarbia secondary to COPD with acute exacerbation- patient is being admitted to stepdown ICU for ongoing BiPAP therapy with the intention of improving PCO2.  Continue IV steroids IV antibiotics as ordered.  Continue scheduled bronchodilators as ordered.  Wean BiPAP as able.  Atypical chest pain-symptoms resolved with pleurisy associated with frequent coughing.  High-sensitivity troponin tests have ruled out for ACS, do not suspect ACS.  History of gastritis and duodenitis-IV Protonix ordered for GI protection  Chronic diastolic heart failure- patient is compensated with no findings of acute heart failure exacerbation with, plan to resume home medications and follow clinically.  DNR present on admission we will continue DNR order while inpatient.  Controlled type 2 diabetes mellitus with peripheral neuropathy as evidenced by hemoglobin A1c less than 7%- supplemental sliding scale coverage ordered with frequent CBG testing.  Anemia in chronic kidney disease-hemoglobin stable at 8.0 from recent hospitalization.  Continue to follow.  Stage IIIb CKD-stable from recent testing.  Chronic gout-stable resume home medications and follow clinically.  Phantom limb pain-symptomatic management.   DVT prophylaxis: Heparin subcu Code Status: DNR Family Communication: Care plan updated with patient at bedside who verbalized understanding Disposition Plan: return to [facility] when  medically stabilized   Consults called: n/a   Admission status: INP  Level of care: Stepdown Irwin Brakeman MD [facility] How to contact the [facility] Attending or Consulting provider [place] or covering provider during after hours [place], for this patient?  1. Check the care team in [facility] and look for a) attending/consulting [facility] provider listed and b) the [facility] team listed 2. Log into [email or web address] and use [place]'s universal password to access. If you do not have the password, please contact the hospital operator. 3. Locate the [facility] provider you are looking for under [facility] and page to a number that you can be directly reached. 4. If you still have difficulty reaching the provider, please page the [facility] (Director on Call) for the Hospitalists listed on amion for assistance.   If 7PM-7AM, please contact night-coverage [email or web address] Password [facility]  06/10/2020, 3:09 PM

## 2020-06-11 LAB — CBC WITH DIFFERENTIAL/PLATELET
Abs Immature Granulocytes: 0.03 10*3/uL (ref 0.00–0.07)
Basophils Absolute: 0 10*3/uL (ref 0.0–0.1)
Basophils Relative: 0 %
Eosinophils Absolute: 0 10*3/uL (ref 0.0–0.5)
Eosinophils Relative: 0 %
HCT: 26.5 % — ABNORMAL LOW (ref 39.0–52.0)
Hemoglobin: 8.4 g/dL — ABNORMAL LOW (ref 13.0–17.0)
Immature Granulocytes: 1 %
Lymphocytes Relative: 16 %
Lymphs Abs: 1 10*3/uL (ref 0.7–4.0)
MCH: 31 pg (ref 26.0–34.0)
MCHC: 31.7 g/dL (ref 30.0–36.0)
MCV: 97.8 fL (ref 80.0–100.0)
Monocytes Absolute: 0.1 10*3/uL (ref 0.1–1.0)
Monocytes Relative: 1 %
Neutro Abs: 5.2 10*3/uL (ref 1.7–7.7)
Neutrophils Relative %: 82 %
Platelets: 235 10*3/uL (ref 150–400)
RBC: 2.71 MIL/uL — ABNORMAL LOW (ref 4.22–5.81)
RDW: 14.4 % (ref 11.5–15.5)
WBC: 6.4 10*3/uL (ref 4.0–10.5)
nRBC: 0 % (ref 0.0–0.2)

## 2020-06-11 LAB — COMPREHENSIVE METABOLIC PANEL
ALT: 12 U/L (ref 0–44)
AST: 13 U/L — ABNORMAL LOW (ref 15–41)
Albumin: 2.9 g/dL — ABNORMAL LOW (ref 3.5–5.0)
Alkaline Phosphatase: 46 U/L (ref 38–126)
Anion gap: 8 (ref 5–15)
BUN: 47 mg/dL — ABNORMAL HIGH (ref 8–23)
CO2: 31 mmol/L (ref 22–32)
Calcium: 9 mg/dL (ref 8.9–10.3)
Chloride: 92 mmol/L — ABNORMAL LOW (ref 98–111)
Creatinine, Ser: 2 mg/dL — ABNORMAL HIGH (ref 0.61–1.24)
GFR, Estimated: 36 mL/min — ABNORMAL LOW (ref >60)
Glucose, Bld: 282 mg/dL — ABNORMAL HIGH (ref 70–99)
Potassium: 4.2 mmol/L (ref 3.5–5.1)
Sodium: 131 mmol/L — ABNORMAL LOW (ref 135–145)
Total Bilirubin: 0.4 mg/dL (ref 0.3–1.2)
Total Protein: 10.3 g/dL — ABNORMAL HIGH (ref 6.5–8.1)

## 2020-06-11 LAB — GLUCOSE, CAPILLARY
Glucose-Capillary: 252 mg/dL — ABNORMAL HIGH (ref 70–99)
Glucose-Capillary: 248 mg/dL — ABNORMAL HIGH (ref 70–99)
Glucose-Capillary: 294 mg/dL — ABNORMAL HIGH (ref 70–99)
Glucose-Capillary: 404 mg/dL — ABNORMAL HIGH (ref 70–99)
Glucose-Capillary: 460 mg/dL — ABNORMAL HIGH (ref 70–99)
Glucose-Capillary: 368 mg/dL — ABNORMAL HIGH (ref 70–99)

## 2020-06-11 LAB — MAGNESIUM: Magnesium: 2.1 mg/dL (ref 1.7–2.4)

## 2020-06-11 LAB — MRSA PCR SCREENING: MRSA by PCR: POSITIVE — AB

## 2020-06-11 MED ORDER — INSULIN ASPART 100 UNIT/ML IJ SOLN
0.0000 [IU] | Freq: Every day | INTRAMUSCULAR | Status: DC
  Administered 2020-06-11: 5 [IU] via SUBCUTANEOUS

## 2020-06-11 MED ORDER — IPRATROPIUM-ALBUTEROL 0.5-2.5 (3) MG/3ML IN SOLN
3.0000 mL | Freq: Three times a day (TID) | RESPIRATORY_TRACT | Status: DC
  Administered 2020-06-11 – 2020-06-12 (×3): 3 mL via RESPIRATORY_TRACT
  Filled 2020-06-11 (×3): qty 3

## 2020-06-11 MED ORDER — CHLORHEXIDINE GLUCONATE CLOTH 2 % EX PADS: 6.0000 | MEDICATED_PAD | Freq: Every day | CUTANEOUS | Status: DC

## 2020-06-11 MED ORDER — HYDRALAZINE HCL 25 MG PO TABS
50.0000 mg | ORAL_TABLET | Freq: Three times a day (TID) | ORAL | Status: DC
  Administered 2020-06-11 – 2020-06-12 (×2): 50 mg via ORAL
  Filled 2020-06-11 (×2): qty 2

## 2020-06-11 MED ORDER — MUPIROCIN 2 % EX OINT
1.0000 "application " | TOPICAL_OINTMENT | Freq: Two times a day (BID) | CUTANEOUS | Status: DC
  Administered 2020-06-11 – 2020-06-12 (×3): 1 via NASAL
  Filled 2020-06-11 (×3): qty 22

## 2020-06-11 MED ORDER — INSULIN ASPART 100 UNIT/ML IJ SOLN
5.0000 [IU] | Freq: Three times a day (TID) | INTRAMUSCULAR | Status: DC
  Administered 2020-06-11: 5 [IU] via SUBCUTANEOUS

## 2020-06-11 MED ORDER — INSULIN ASPART 100 UNIT/ML IJ SOLN: 10.0000 [IU] | Freq: Three times a day (TID) | INTRAMUSCULAR | Status: DC

## 2020-06-11 MED ORDER — INSULIN DETEMIR 100 UNIT/ML ~~LOC~~ SOLN
15.0000 [IU] | Freq: Two times a day (BID) | SUBCUTANEOUS | Status: DC
  Administered 2020-06-11: 15 [IU] via SUBCUTANEOUS
  Filled 2020-06-11 (×2): qty 0.15

## 2020-06-11 MED ORDER — INSULIN ASPART 100 UNIT/ML IJ SOLN
0.0000 [IU] | Freq: Three times a day (TID) | INTRAMUSCULAR | Status: DC
  Administered 2020-06-12: 20 [IU] via SUBCUTANEOUS
  Administered 2020-06-12: 15 [IU] via SUBCUTANEOUS

## 2020-06-11 MED ORDER — METHYLPREDNISOLONE SODIUM SUCC 40 MG IJ SOLR
40.0000 mg | Freq: Three times a day (TID) | INTRAMUSCULAR | Status: DC
  Administered 2020-06-11 – 2020-06-12 (×2): 40 mg via INTRAVENOUS
  Filled 2020-06-11 (×2): qty 1

## 2020-06-11 MED ORDER — INSULIN ASPART 100 UNIT/ML IJ SOLN: 8.0000 [IU] | Freq: Three times a day (TID) | INTRAMUSCULAR | Status: DC

## 2020-06-11 MED ORDER — INSULIN ASPART 100 UNIT/ML IJ SOLN
8.0000 [IU] | Freq: Once | INTRAMUSCULAR | Status: AC
  Administered 2020-06-11: 8 [IU] via SUBCUTANEOUS

## 2020-06-11 MED ORDER — ISOSORBIDE MONONITRATE ER 60 MG PO TB24
120.0000 mg | ORAL_TABLET | Freq: Every day | ORAL | Status: DC
  Administered 2020-06-11 – 2020-06-12 (×2): 120 mg via ORAL
  Filled 2020-06-11 (×2): qty 2

## 2020-06-11 MED ORDER — ROSUVASTATIN CALCIUM 20 MG PO TABS
20.0000 mg | ORAL_TABLET | Freq: Every evening | ORAL | Status: DC
  Administered 2020-06-11: 20 mg via ORAL
  Filled 2020-06-11: qty 1

## 2020-06-11 MED ORDER — CARVEDILOL 12.5 MG PO TABS
25.0000 mg | ORAL_TABLET | Freq: Two times a day (BID) | ORAL | Status: DC
  Administered 2020-06-11 – 2020-06-12 (×3): 25 mg via ORAL
  Filled 2020-06-11 (×3): qty 2

## 2020-06-11 MED ORDER — CHLORHEXIDINE GLUCONATE CLOTH 2 % EX PADS
6.0000 | MEDICATED_PAD | Freq: Every day | CUTANEOUS | Status: DC
  Administered 2020-06-11 – 2020-06-12 (×2): 6 via TOPICAL

## 2020-06-11 MED ORDER — INSULIN ASPART 100 UNIT/ML IJ SOLN
20.0000 [IU] | Freq: Once | INTRAMUSCULAR | Status: AC
  Administered 2020-06-11: 20 [IU] via SUBCUTANEOUS

## 2020-06-11 MED ORDER — INSULIN GLARGINE 100 UNIT/ML ~~LOC~~ SOLN
14.0000 [IU] | SUBCUTANEOUS | Status: DC
  Administered 2020-06-11: 14 [IU] via SUBCUTANEOUS
  Filled 2020-06-11 (×2): qty 0.14

## 2020-06-11 MED ORDER — ASPIRIN EC 81 MG PO TBEC
81.0000 mg | DELAYED_RELEASE_TABLET | Freq: Every day | ORAL | Status: DC
  Administered 2020-06-11 – 2020-06-12 (×2): 81 mg via ORAL
  Filled 2020-06-11 (×2): qty 1

## 2020-06-11 NOTE — Progress Notes (Signed)
PROGRESS NOTE   Cory Weber  ZWC:[id number] DOB: January 28, 1951 DOA: 06/10/2020 PCP: Iona Beard, MD   Chief Complaint  Patient presents with  . Chest Pain   Level of care: Stepdown  Brief Admission History:   70 y.o. male with medical history significant for COPD, long history of tobacco abuse, CAD, peripheral vascular disease status post bilateral lower extremity amputations, type 2 diabetes mellitus, congestive heart, chronic supplemental oxygen dependence, long-term resident at [facility], history of cerebrovascular disease, history of gangrene, hyperlipidemia, peripheral neuropathy, stage IIIb CKD, reportedly has been having increasing shortness of breath symptoms for the past month.  He reports his symptoms became acutely worse in the last 24 hours .  Assessment & Plan:   Principal Problem:   COPD with acute exacerbation ([place]) Active Problems:   Acute respiratory failure with hypoxia and hypercarbia (HCC)   Hemiparesthesia   Polysubstance abuse (HCC)   S/P lumbar spinal fusion   Chronic kidney disease, stage 3 (HCC)   Essential hypertension   Chronic hepatitis C without hepatic coma (HCC)   History of CVA (cerebrovascular accident) without residual deficits   Phantom limb pain (HCC)   Anemia of chronic disease   HLD (hyperlipidemia)   BPH (benign prostatic hyperplasia)   ETOH abuse   PVD (peripheral vascular disease) (HCC)   GERD (gastroesophageal reflux disease)   Cough   Uncontrolled type II diabetes with peripheral autonomic neuropathy (HCC)   Type 2 diabetes with nephropathy (HCC)   Chronic gout due to renal impairment involving toe of right foot without tophus   Physical deconditioning   Chronic diastolic CHF (congestive heart failure) (HCC)   Constipation due to opioid therapy   PAD (peripheral artery disease) ([place])   DNR (do not resuscitate)   Gastritis and gastroduodenitis   Grade I internal hemorrhoids   AVM (arteriovenous malformation) of small bowel,  acquired   COPD (chronic obstructive pulmonary disease) (HCC)   Acute on chronic respiratory failure with hypoxia and hypercarbia secondary to COPD with acute exacerbation- Continue IV steroids IV antibiotics as ordered.  Continue scheduled bronchodilators as ordered.  Wean BiPAP as able.  He has been able to come off bipap for short periods.  Stay in stepdown ICU today.   Atypical chest pain-symptoms resolved with pleurisy associated with frequent coughing.  High-sensitivity troponin tests have ruled out for ACS, do not suspect ACS.  CAD - awaiting for medications to be reconciled resume all home meds when able.    History of gastritis and duodenitis-IV Protonix ordered for GI protection  Chronic diastolic heart failure- patient is compensated with no findings of acute heart failure exacerbation with, plan to resume home medications and follow clinically.  DNR present on admission we will continue DNR order while inpatient.  Controlled type 2 diabetes mellitus with peripheral neuropathy as evidenced by hemoglobin A1c less than 7%- supplemental sliding scale coverage ordered with frequent CBG testing.  Anemia in chronic kidney disease-hemoglobin stable at 8.0 from recent hospitalization.  Continue to follow.  Stage IIIb CKD-stable from recent testing.  Chronic gout-stable resume home medications and follow clinically.  Phantom limb pain-symptomatic management.   DVT prophylaxis: Heparin subcu Code Status: DNR Family Communication: Care plan updated with patient at bedside who verbalized understanding Disposition Plan: return to [facility] when medically stabilized   Status is: Inpatient  Remains inpatient appropriate because:IV treatments appropriate due to intensity of illness or inability to take PO and Inpatient level of care appropriate due to severity of illness  Dispo: The patient is from: SNF              Anticipated d/c is to: SNF              Patient  currently is not medically stable to d/c.   Difficult to place patient No    Consultants:     Procedures:     Antimicrobials:  Doxycycline 4/30>>   Subjective: Pt reports that he is having wet cough and chest congestion, seems to be tolerating bipap.   Objective: Vitals:   06/11/20 0500 06/11/20 0600 06/11/20 0700 06/11/20 0733  BP: (!) 147/66 (!) 150/65 (!) 177/60   Pulse: 67 77 73   Resp: 19 16 19    Temp:    99 F (37.2 C)  TempSrc:    Axillary  SpO2: 97% 99% 99%   Weight:      Height:        Intake/Output Summary (Last 24 hours) at 06/11/2020 1006 Last data filed at 06/11/2020 0730 Gross per 24 hour  Intake 900 ml  Output 800 ml  Net 100 ml   Filed Weights   06/10/20 1201 06/10/20 2320 06/10/20 2325  Weight: 113.4 kg 114 kg 114 kg    Examination:  General exam: chronically ill appearing, Appears calm and comfortable  Respiratory system: diffuse insp/expiratory wheezing,cough and chest congestion. Cardiovascular system: normal S1 & S2 heard. No JVD, murmurs, rubs, gallops or clicks. No pedal edema. Gastrointestinal system: Abdomen is nondistended, soft and nontender. No organomegaly or masses felt. Normal bowel sounds heard. Central nervous system: Alert and oriented. No focal neurological deficits. Extremities: bilateral LE amputee, right AKA and left BKA.   Skin: No rashes, lesions or ulcers Psychiatry: Judgement and insight appear normal. Mood & affect appropriate.   Data Reviewed: I have personally reviewed following labs and imaging studies  CBC: Recent Labs  Lab 06/10/20 1226 06/11/20 0344  WBC 6.0 6.4  NEUTROABS  --  5.2  HGB 8.0* 8.4*  HCT 25.9* 26.5*  MCV 99.6 97.8  PLT 196 213    Basic Metabolic Panel: Recent Labs  Lab 06/10/20 1226 06/11/20 0344  NA 133* 131*  K 4.0 4.2  CL 91* 92*  CO2 34* 31  GLUCOSE 194* 282*  BUN 32* 47*  CREATININE 1.86* 2.00*  CALCIUM 9.0 9.0  MG  --  2.1    GFR: Estimated Creatinine Clearance: 47.4  mL/min (A) (by C-G formula based on SCr of 2 mg/dL (H)).  Liver Function Tests: Recent Labs  Lab 06/11/20 0344  AST 13*  ALT 12  ALKPHOS 46  BILITOT 0.4  PROT 10.3*  ALBUMIN 2.9*    CBG: Recent Labs  Lab 06/10/20 1824 06/10/20 2323 06/11/20 0428 06/11/20 0735  GLUCAP 173* 263* 252* 248*    Recent Results (from the past 240 hour(s))  SARS CORONAVIRUS 2 (TAT 6-24 HRS) Nasopharyngeal Nasopharyngeal Swab     Status: None   Collection Time: 06/01/20  5:30 PM   Specimen: Nasopharyngeal Swab  Result Value Ref Range Status   SARS Coronavirus 2 NEGATIVE NEGATIVE Final    Comment: (NOTE) SARS-CoV-2 target nucleic acids are NOT DETECTED.  The SARS-CoV-2 RNA is generally detectable in upper and lower respiratory specimens during the acute phase of infection. Negative results do not preclude SARS-CoV-2 infection, do not rule out co-infections with other pathogens, and should not be used as the sole basis for treatment or other patient management decisions. Negative results must be combined with clinical observations, patient  history, and epidemiological information. The expected result is Negative.  Fact Sheet for Patients: SugarRoll.be  Fact Sheet for Healthcare Providers: https://www.woods-mathews.com/  This test is not yet approved or cleared by the [place] FDA and  has been authorized for detection and/or diagnosis of SARS-CoV-2 by FDA under an Emergency Use Authorization (EUA). This EUA will remain  in effect (meaning this test can be used) for the duration of the COVID-19 declaration under Se ction 564(b)(1) of the Act, 21 U.S.C. section 360bbb-3(b)(1), unless the authorization is terminated or revoked sooner.  Performed at [facility], [place] [address] [place], [place] [address]   Resp Panel by RT-PCR (Flu A&B, Covid) Nasopharyngeal Swab     Status: None   Collection Time: 06/10/20  3:59 PM   Specimen:  Nasopharyngeal Swab; Nasopharyngeal(NP) swabs in vial transport medium  Result Value Ref Range Status   SARS Coronavirus 2 by RT PCR NEGATIVE NEGATIVE Final    Comment: (NOTE) SARS-CoV-2 target nucleic acids are NOT DETECTED.  The SARS-CoV-2 RNA is generally detectable in upper respiratory specimens during the acute phase of infection. The lowest concentration of SARS-CoV-2 viral copies this assay can detect is 138 copies/mL. A negative result does not preclude SARS-Cov-2 infection and should not be used as the sole basis for treatment or other patient management decisions. A negative result may occur with  improper specimen collection/handling, submission of specimen other than nasopharyngeal swab, presence of viral mutation(s) within the areas targeted by this assay, and inadequate number of viral copies(<138 copies/mL). A negative result must be combined with clinical observations, patient history, and epidemiological information. The expected result is Negative.  Fact Sheet for Patients:  EntrepreneurPulse.com.au  Fact Sheet for Healthcare Providers:  IncredibleEmployment.be  This test is no t yet approved or cleared by the [place] FDA and  has been authorized for detection and/or diagnosis of SARS-CoV-2 by FDA under an Emergency Use Authorization (EUA). This EUA will remain  in effect (meaning this test can be used) for the duration of the COVID-19 declaration under Section 564(b)(1) of the Act, 21 U.S.C.section 360bbb-3(b)(1), unless the authorization is terminated  or revoked sooner.       Influenza A by PCR NEGATIVE NEGATIVE Final   Influenza B by PCR NEGATIVE NEGATIVE Final    Comment: (NOTE) The Xpert Xpress SARS-CoV-2/FLU/RSV plus assay is intended as an aid in the diagnosis of influenza from Nasopharyngeal swab specimens and should not be used as a sole basis for treatment. Nasal washings and aspirates are unacceptable for  Xpert Xpress SARS-CoV-2/FLU/RSV testing.  Fact Sheet for Patients: EntrepreneurPulse.com.au  Fact Sheet for Healthcare Providers: IncredibleEmployment.be  This test is not yet approved or cleared by the [place] FDA and has been authorized for detection and/or diagnosis of SARS-CoV-2 by FDA under an Emergency Use Authorization (EUA). This EUA will remain in effect (meaning this test can be used) for the duration of the COVID-19 declaration under Section 564(b)(1) of the Act, 21 U.S.C. section 360bbb-3(b)(1), unless the authorization is terminated or revoked.  Performed at [facility], [address] [place], [place] [address]   MRSA PCR Screening     Status: Abnormal   Collection Time: 06/10/20 11:46 PM   Specimen: Nasal Mucosa; Nasopharyngeal  Result Value Ref Range Status   MRSA by PCR POSITIVE (A) NEGATIVE Final    Comment:        The GeneXpert MRSA Assay (FDA approved for NASAL specimens only), is one component of a comprehensive MRSA  colonization surveillance program. It is not intended to diagnose MRSA infection nor to guide or monitor treatment for MRSA infections. RESULT CALLED TO, READ BACK BY AND VERIFIED WITH: CODY KINDLEY 5/1 @ 2706 BY S BEARD Performed at [facility], [address], [place], [place] [address]      Radiology Studies: [facility] Chest Hss [facility] 1 View  Result Date: 06/10/2020 CLINICAL DATA:  Chest pain and shortness of breath onset several days. EXAM: PORTABLE CHEST 1 VIEW COMPARISON:  Chest x-rays dated 04/28/2020, 04/26/2020 and 03/20/2020. FINDINGS: Borderline cardiomegaly. Lungs are clear. No pleural effusion or pneumothorax is seen. Osseous structures about the chest are unremarkable. IMPRESSION: 1. Borderline cardiomegaly. 2. No evidence of pneumonia or pulmonary edema. 3. Aortic atherosclerosis. Electronically Signed   By: Franki Cabot M.D.   On: 06/10/2020 13:06    Scheduled Meds: . aspirin EC  81 mg Oral  Q breakfast  . carvedilol  25 mg Oral BID WC  . Chlorhexidine Gluconate Cloth  6 each Topical Q0600  . [START ON 06/16/2020] Chlorhexidine Gluconate Cloth  6 each Topical Daily  . guaiFENesin  600 mg Oral BID  . heparin injection (subcutaneous)  5,000 Units Subcutaneous Q8H  . insulin aspart  0-9 Units Subcutaneous TID WC  . insulin aspart  5 Units Subcutaneous TID WC  . insulin glargine  14 Units Subcutaneous [id number]  . ipratropium-albuterol  3 mL Nebulization TID  . methylPREDNISolone (SOLU-MEDROL) injection  60 mg Intravenous Q6H  . mupirocin ointment  1 application Nasal BID  . pantoprazole (PROTONIX) IV  40 mg Intravenous Q24H   Continuous Infusions: . doxycycline (VIBRAMYCIN) IV 100 mg (06/11/20 0423)     LOS: 1 day   Time spent: 35 mins  Mackensey Bolte Wynetta Emery, MD How to contact the [facility] Attending or Consulting provider [place] or covering provider during after hours [place], for this patient?  1. Check the care team in [facility] and look for a) attending/consulting TRH provider listed and b) the [facility] team listed 2. Log into [email or web address] and use [place]'s universal password to access. If you do not have the password, please contact the hospital operator. 3. Locate the [facility] provider you are looking for under [facility] and page to a number that you can be directly reached. 4. If you still have difficulty reaching the provider, please page the [facility] (Director on Call) for the Hospitalists listed on amion for assistance.  06/11/2020, 10:06 AM

## 2020-06-12 ENCOUNTER — Other Ambulatory Visit (HOSPITAL_COMMUNITY): Payer: Self-pay

## 2020-06-12 ENCOUNTER — Ambulatory Visit: Payer: Medicare (Managed Care) | Admitting: Gastroenterology

## 2020-06-12 DIAGNOSIS — G546 Phantom limb syndrome with pain: Secondary | ICD-10-CM

## 2020-06-12 DIAGNOSIS — E1143 Type 2 diabetes mellitus with diabetic autonomic (poly)neuropathy: Secondary | ICD-10-CM

## 2020-06-12 DIAGNOSIS — N400 Enlarged prostate without lower urinary tract symptoms: Secondary | ICD-10-CM

## 2020-06-12 DIAGNOSIS — I10 Essential (primary) hypertension: Secondary | ICD-10-CM

## 2020-06-12 DIAGNOSIS — E1165 Type 2 diabetes mellitus with hyperglycemia: Secondary | ICD-10-CM

## 2020-06-12 DIAGNOSIS — K219 Gastro-esophageal reflux disease without esophagitis: Secondary | ICD-10-CM

## 2020-06-12 LAB — COMPREHENSIVE METABOLIC PANEL
ALT: 12 U/L (ref 0–44)
AST: 13 U/L — ABNORMAL LOW (ref 15–41)
Albumin: 2.8 g/dL — ABNORMAL LOW (ref 3.5–5.0)
Alkaline Phosphatase: 43 U/L (ref 38–126)
Anion gap: 5 (ref 5–15)
BUN: 45 mg/dL — ABNORMAL HIGH (ref 8–23)
CO2: 31 mmol/L (ref 22–32)
Calcium: 9 mg/dL (ref 8.9–10.3)
Chloride: 93 mmol/L — ABNORMAL LOW (ref 98–111)
Creatinine, Ser: 1.65 mg/dL — ABNORMAL HIGH (ref 0.61–1.24)
GFR, Estimated: 45 mL/min — ABNORMAL LOW (ref >60)
Glucose, Bld: 344 mg/dL — ABNORMAL HIGH (ref 70–99)
Potassium: 4 mmol/L (ref 3.5–5.1)
Sodium: 129 mmol/L — ABNORMAL LOW (ref 135–145)
Total Bilirubin: 0.5 mg/dL (ref 0.3–1.2)
Total Protein: 10.1 g/dL — ABNORMAL HIGH (ref 6.5–8.1)

## 2020-06-12 LAB — HEMOGLOBIN A1C
Hgb A1c MFr Bld: 7.4 % — ABNORMAL HIGH (ref 4.8–5.6)
Mean Plasma Glucose: 166 mg/dL

## 2020-06-12 LAB — GLUCOSE, CAPILLARY
Glucose-Capillary: 314 mg/dL — ABNORMAL HIGH (ref 70–99)
Glucose-Capillary: 316 mg/dL — ABNORMAL HIGH (ref 70–99)
Glucose-Capillary: 390 mg/dL — ABNORMAL HIGH (ref 70–99)

## 2020-06-12 LAB — MAGNESIUM: Magnesium: 2.1 mg/dL (ref 1.7–2.4)

## 2020-06-12 MED ORDER — GABAPENTIN 300 MG PO CAPS
300.0000 mg | ORAL_CAPSULE | Freq: Three times a day (TID) | ORAL | Status: DC
  Administered 2020-06-12: 300 mg via ORAL
  Filled 2020-06-12: qty 1

## 2020-06-12 MED ORDER — GABAPENTIN 300 MG PO CAPS: 300.0000 mg | ORAL_CAPSULE | Freq: Three times a day (TID) | ORAL | Status: DC

## 2020-06-12 MED ORDER — INSULIN GLARGINE 100 UNIT/ML ~~LOC~~ SOLN: 20.0000 [IU] | Freq: Every day | SUBCUTANEOUS | 11 refills | Status: DC

## 2020-06-12 MED ORDER — DOXYCYCLINE HYCLATE 100 MG PO CAPS: 100.0000 mg | ORAL_CAPSULE | Freq: Two times a day (BID) | ORAL | 0 refills | Status: AC

## 2020-06-12 MED ORDER — PREDNISONE 20 MG PO TABS: 40.0000 mg | ORAL_TABLET | Freq: Every day | ORAL | 0 refills | Status: AC

## 2020-06-12 MED ORDER — METHYLPREDNISOLONE SODIUM SUCC 40 MG IJ SOLR: 40.0000 mg | Freq: Every day | INTRAMUSCULAR | Status: DC

## 2020-06-12 MED ORDER — INSULIN LISPRO 100 UNIT/ML ~~LOC~~ SOLN: 8.0000 [IU] | Freq: Three times a day (TID) | SUBCUTANEOUS | Status: DC

## 2020-06-12 MED ORDER — INSULIN DETEMIR 100 UNIT/ML ~~LOC~~ SOLN
20.0000 [IU] | Freq: Two times a day (BID) | SUBCUTANEOUS | Status: DC
  Administered 2020-06-12: 20 [IU] via SUBCUTANEOUS
  Filled 2020-06-12 (×3): qty 0.2

## 2020-06-12 MED ORDER — GUAIFENESIN ER 600 MG PO TB12: 1200.0000 mg | ORAL_TABLET | Freq: Two times a day (BID) | ORAL | 0 refills | Status: AC

## 2020-06-12 MED ORDER — INSULIN ASPART 100 UNIT/ML IJ SOLN
14.0000 [IU] | Freq: Three times a day (TID) | INTRAMUSCULAR | Status: DC
  Administered 2020-06-12 (×2): 14 [IU] via SUBCUTANEOUS

## 2020-06-12 NOTE — NC FL2 (Signed)
[place] LEVEL OF CARE SCREENING TOOL     IDENTIFICATION  Patient Name: Cory Weber Birthdate: 12-Oct-1950 Sex: male Admission Date (Current Location): 06/10/2020  [place] and [place] Number:  [place] [id number] [place] and Address:  [place] [address] [place]      Provider Number: [id number]  Attending Physician Name and Address:  Murlean Iba, MD  Relative Name and Phone Number:  Geannie Risen (Sister)   [phone]    Current Level of Care: Hospital Recommended Level of Care: [place] Prior Approval Number:    Date Approved/Denied:   PASRR Number:    Discharge Plan: SNF    Current Diagnoses: Patient Active Problem List   Diagnosis Date Noted  . COPD with acute exacerbation ([place]) 06/10/2020  . Acute respiratory failure with hypoxia and hypercarbia ([place]) 06/10/2020  . COPD (chronic obstructive pulmonary disease) ([place])   . Acute on chronic anemia 06/01/2020  . Class 2 obesity due to excess calories with body mass index (BMI) of 35.0 to 35.9 in adult   . Acute on chronic combined systolic and diastolic congestive heart failure ([place]) 04/27/2020  . AVM (arteriovenous malformation) of small bowel, acquired   . Gastritis and gastroduodenitis   . Gastric diverticulum   . Grade I internal hemorrhoids   . Occult blood in stools   . Heart failure ([place]) 03/20/2020  . NSTEMI (non-ST elevated myocardial infarction) ([place]) 03/20/2020  . Pneumonia due to COVID-19 virus 02/24/2019  . Acute on chronic respiratory failure with hypoxia ([place]) 02/20/2019  . COVID-19 virus detected 02/20/2019  . DNR (do not resuscitate) 02/20/2019  . PAD (peripheral artery disease) ([place]) 08/22/2016  . Osteomyelitis of foot ([place]) 07/08/2016  . S/P transmetatarsal amputation of foot, right ([place]) 06/26/2016  . Constipation due to opioid therapy 06/15/2016  . Physical deconditioning 06/10/2016  . Chronic diastolic CHF  (congestive heart failure) ([place]) 06/10/2016  . Ischemic pain of right foot 06/04/2016  . Ischemia of foot 05/31/2016  . 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 Type 2 diabetes with nephropathy ([place]) 01/21/2016  . Uncontrolled type II diabetes with peripheral autonomic neuropathy ([place]) 01/17/2016  . Edema of right lower extremity 12/20/2015  . Cough 11/26/2015  . GERD (gastroesophageal reflux disease) 10/08/2015  . Amputation of left lower extremity below knee ([place]) 08/21/2015  . Phantom limb pain ([place])   . Anemia of chronic disease   . HLD (hyperlipidemia)   . BPH (benign prostatic hyperplasia)   . ETOH abuse   . PVD (peripheral vascular disease) ([place])   . Chronic hepatitis C without hepatic coma ([place])   . History of CVA (cerebrovascular accident) without residual deficits   . Hyponatremia   . Chronic kidney disease, stage 3 ([place]) 07/18/2015  . Essential hypertension 07/18/2015  . Acute blood loss anemia 12/01/2013  . S/P lumbar spinal fusion 11/25/2013  . Melena 12/03/2011  . Hemiparesthesia 02/03/2011  . Polysubstance abuse ([place]) 02/03/2011  . Tobacco abuse 02/03/2011    Orientation RESPIRATION BLADDER Height & Weight     Self,Time,Situation,Place  O2 Incontinent Weight: 251 lb 5.2 oz (114 kg) Height:  6\' 1"  (185.4 cm)  BEHAVIORAL SYMPTOMS/MOOD NEUROLOGICAL BOWEL NUTRITION STATUS      Incontinent Diet (carb modified)  AMBULATORY STATUS COMMUNICATION OF NEEDS Skin   Total Care (uses wheelchair) Verbally  Personal Care Assistance Level of Assistance  Bathing,Feeding,Dressing Bathing Assistance: Limited assistance Feeding assistance: Independent Dressing Assistance: Limited assistance     Functional Limitations Info  Sight,Hearing,Speech Sight Info: Adequate Hearing Info: Adequate Speech Info: Adequate    SPECIAL CARE FACTORS FREQUENCY        PT Frequency: n/a               Contractures Contractures Info: Not present    Additional Factors Info  Code Status,Allergies Code Status Info: DNR Allergies Info: NKA           Current Medications (06/12/2020):  This is the current hospital active medication list Current Facility-Administered Medications  Medication Dose Route Frequency Provider Last Rate Last Admin  . acetaminophen (TYLENOL) tablet 650 mg  650 mg Oral Q6H PRN Johnson, Clanford L, MD       Or  . acetaminophen (TYLENOL) suppository 650 mg  650 mg Rectal Q6H PRN Johnson, Clanford L, MD      . aspirin EC tablet 81 mg  81 mg Oral Q breakfast Johnson, Clanford L, MD   81 mg at 06/12/20 0805  . bisacodyl (DULCOLAX) EC tablet 5 mg  5 mg Oral Daily PRN Johnson, Clanford L, MD      . carvedilol (COREG) tablet 25 mg  25 mg Oral BID WC Johnson, Clanford L, MD   25 mg at 06/12/20 0805  . Chlorhexidine Gluconate Cloth 2 % PADS 6 each  6 each Topical Q0600 Zierle-Ghosh, Asia B, DO   6 each at 06/12/20 0513  . [START ON 06/16/2020] Chlorhexidine Gluconate Cloth 2 % PADS 6 each  6 each Topical Daily Johnson, Clanford L, MD      . doxycycline (VIBRAMYCIN) 100 mg in sodium chloride 0.9 % 250 mL IVPB  100 mg Intravenous Q12H Murlean Iba, MD   Stopped at 06/12/20 0531  . gabapentin (NEURONTIN) capsule 300 mg  300 mg Oral TID Wynetta Emery, Clanford L, MD   300 mg at 06/12/20 1201  . guaiFENesin (MUCINEX) 12 hr tablet 600 mg  600 mg Oral BID Johnson, Clanford L, MD   600 mg at 06/12/20 0951  . heparin injection 5,000 Units  5,000 Units Subcutaneous Q8H Johnson, Clanford L, MD   5,000 Units at 06/12/20 0512  . hydrALAZINE (APRESOLINE) tablet 50 mg  50 mg Oral Q8H Johnson, Clanford L, MD   50 mg at 06/12/20 0512  . insulin aspart (novoLOG) injection 0-20 Units  0-20 Units Subcutaneous TID WC Johnson, Clanford L, MD   20 Units at 06/12/20 1201  . insulin aspart (novoLOG) injection 0-5 Units  0-5 Units Subcutaneous QHS Wynetta Emery, Clanford L, MD   5 Units at 06/11/20 2331  .  insulin aspart (novoLOG) injection 14 Units  14 Units Subcutaneous TID WC Johnson, Clanford L, MD   14 Units at 06/12/20 1202  . insulin detemir (LEVEMIR) injection 20 Units  20 Units Subcutaneous BID Irwin Brakeman L, MD   20 Units at 06/12/20 0951  . ipratropium-albuterol (DUONEB) 0.5-2.5 (3) MG/3ML nebulizer solution 3 mL  3 mL Nebulization TID Wynetta Emery, Clanford L, MD   3 mL at 06/12/20 0801  . isosorbide mononitrate (IMDUR) 24 hr tablet 120 mg  120 mg Oral Daily Johnson, Clanford L, MD   120 mg at 06/12/20 0951  . [START ON 06/13/2020] methylPREDNISolone sodium succinate (SOLU-MEDROL) 40 mg/mL injection 40 mg  40 mg Intravenous Daily Johnson, Clanford L, MD      . mupirocin ointment (BACTROBAN) 2 % 1  application  1 application Nasal BID Zierle-Ghosh, Asia B, DO   1 application at 41/42/39 0951  . oxyCODONE (Oxy IR/ROXICODONE) immediate release tablet 2.5 mg  2.5 mg Oral Q6H PRN Wynetta Emery, Clanford L, MD   2.5 mg at 06/12/20 0336  . pantoprazole (PROTONIX) injection 40 mg  40 mg Intravenous Q24H Johnson, Clanford L, MD   40 mg at 06/11/20 1431  . prochlorperazine (COMPAZINE) injection 10 mg  10 mg Intravenous Q6H PRN Johnson, Clanford L, MD      . rosuvastatin (CRESTOR) tablet 20 mg  20 mg Oral QPM Johnson, Clanford L, MD   20 mg at 06/11/20 1730     Discharge Medications: Please see discharge summary for a list of discharge medications.  Relevant Imaging Results:  Relevant Lab Results:   Additional Information SSN: [id number]  Ihor Gully, LCSW

## 2020-06-12 NOTE — Discharge Instructions (Signed)
Recommendations for Outpatient Follow-up:  1. Please check blood sugar 4 times per day and PRN when not feeling well.  2. Titrate insulin doses as needed for better glycemic control while on steroids 3. When he completes steroids in 5 days will likely need to reduce insulin doses to avoid low blood sugar 4. Ambulatory referral made for sleep study.   5. Please use bronchodilators as needed for wheezing, cough and shortness of breath symptoms.      Acute Respiratory Failure, Adult Acute respiratory failure is a condition that is a medical emergency. It can develop quickly, and it should be treated right away. There are two types of acute respiratory failure:  Type I respiratory failure is when the lungs are not able to get enough oxygen into the blood. This causes the blood oxygen level to drop.  Type II respiratory failure is when carbon dioxide is not passing from the lungs out of the body. This causes carbon dioxide to build up in the blood. A person may have one type of acute respiratory failure or have both types at the same time. What are the causes? Common causes of type I respiratory failure include:  Trauma to the lung, chest, ribs, or tissues around the lung.  Pneumonia.  Lung diseases, such as pulmonary fibrosis or asthma.  Smoke, chemical, or water inhalation.  A blood clot in the lungs (pulmonary embolism).  A blood infection (sepsis).  Heart attack. Common causes of type II respiratory failure include:  Stroke.  A spinal cord injury.  A drug or alcohol overdose.  A blood infection (sepsis).  Cardiac arrest. What increases the risk? This condition is more likely to develop in people who have:  Lung diseases such as asthma or chronic obstructive pulmonary disease (COPD).  A condition that damages or weakens the muscles, nerves, bones, or tissues that are involved in breathing, such as myasthenia gravis or Guillain-Barr syndrome.  A serious  infection.  A health problem that blocks the unconscious reflex that is involved in breathing, such as hypothyroidism or sleep apnea. What are the signs or symptoms? Trouble breathing is the main symptom of acute respiratory failure. Symptoms may also include:  Fast breathing.  Restlessness or anxiety.  Breathing loudly (wheezing) and grunting.  Fast or irregular heartbeats (palpitations).  Confusion or changes in behavior.  Feeling tired (fatigue), sleeping more than normal, or being hard to wake.  Skin, lips, or fingernails that appear blue (cyanosis). How is this diagnosed? This condition may be diagnosed based on:  Your medical history and a physical exam. Your health care provider will listen to your heart and lungs to check for abnormal sounds.  Tests to confirm the diagnosis and determine the cause of respiratory failure. These tests may include: ? Measuring the amount of oxygen in your blood (pulse oximetry). The measurement comes from a small device that is placed on your finger, earlobe, or toe. ? Blood tests to measure blood oxygen and carbon dioxide and to look for signs of infection. ? Tests on a sample of the fluid that surrounds the spinal cord (cerebrospinal fluid) or a sample of fluid that is drawn from the windpipe (trachea) to check for infections. ? Chest X-ray. ? Electrocardiogram (ECG) to look at the heart's electrical activity.   How is this treated? Treatment for this condition usually takes place in a hospital intensive care unit (ICU). Treatment depends on what is causing the condition. It may include one or more of these treatments:  Oxygen  may be given through your nose or a face mask.  A device such as a continuous positive airway pressure (CPAP) machine or bi-level positive airway pressure (BPAP) machine may be used to help you breathe. The device gives you oxygen and pressure.  Breathing treatments, fluids, and other medicines may be given.  A  ventilator may be used to help you breathe. The machine gives you oxygen and pressure. A tube is put into your mouth and trachea to connect the ventilator. ? If this treatment is needed longer term, a tracheostomy may be placed. A tracheostomy is a breathing tube put through your neck into your trachea.  In extreme cases, extracorporeal life support (ECLS) may be used. This treatment temporarily takes over the function of the heart and lungs, supplying oxygen and removing carbon dioxide. ECLS gives the lungs a chance to recover. Follow these instructions at home: Medicines  Take over-the-counter and prescription medicines only as told by your health care provider.  If you were prescribed an antibiotic medicine, take it as told by your health care provider. Do not stop using the antibiotic even if you start to feel better.  If you are taking blood thinners: ? Talk with your health care provider before you take any medicines that contain aspirin or NSAIDs, such as ibuprofen. These medicines increase your risk for dangerous bleeding. ? Take your medicine exactly as told, at the same time every day. ? Avoid activities that could cause injury or bruising, and follow instructions about how to prevent falls. ? Wear a medical alert bracelet or carry a card that lists what medicines you take. General instructions  Return to your normal activities as told by your health care provider. Ask your health care provider what activities are safe for you.  Do not use any products that contain nicotine or tobacco, such as cigarettes, e-cigarettes, and chewing tobacco. If you need help quitting, ask your health care provider.  Do not drink alcohol if: ? Your health care provider tells you not to drink. ? You are pregnant, may be pregnant, or are planning to become pregnant.  Wear compression stockings as told by your health care provider. These stockings help to prevent blood clots and reduce swelling in your  legs.  Attend any physical therapy and pulmonary rehabilitation as told by your health care provider.  Keep all follow-up visits as told by your health care provider. This is important. How is this prevented?  If you have an infection or a medical condition that may lead to acute respiratory failure, make sure you get proper treatment. Contact a health care provider if:  You have a fever.  Your symptoms do not improve or they get worse. Get help right away if:  You are having trouble breathing.  You lose consciousness.  You develop a fast heart rate.  Your fingers, lips, or other areas turn blue.  You are confused. These symptoms may represent a serious problem that is an emergency. Do not wait to see if the symptoms will go away. Get medical help right away. Call your local emergency services (911 in the [place] Do not drive yourself to the hospital. Summary  Acute respiratory failure is a medical emergency. It can develop quickly, and it should be treated right away.  Treatment for this condition usually takes place in a hospital intensive care unit (ICU). Treatment may include oxygen, fluids, and medicines. A device may be used to help you breathe, such as a ventilator.  Take over-the-counter  and prescription medicines only as told by your health care provider.  Contact a health care provider if your symptoms do not improve or if they get worse. This information is not intended to replace advice given to you by your health care provider. Make sure you discuss any questions you have with your health care provider. Document Revised: 01/15/2019 Document Reviewed: 01/15/2019 [employer].

## 2020-06-12 NOTE — Discharge Summary (Addendum)
Physician Discharge Summary  Cory Weber:[id number] DOB: 15-Nov-1950 DOA: 06/10/2020  PCP: Iona Beard, MD  Admit date: 06/10/2020 Discharge date: 06/12/2020  Admitted From:  SNF Disposition:  SNF  Recommendations for Outpatient Follow-up:  1. Please check blood sugar 4 times per day and PRN when not feeling well.  2. Titrate insulin doses as needed for better glycemic control while on steroids 3. When he completes steroids in 5 days will likely need to reduce insulin doses to avoid low blood sugar 4. Ambulatory referral made for sleep study.   5. Please use bronchodilators as needed for wheezing, cough and shortness of breath symptoms.   Discharge Condition: STABLE   CODE STATUS: DNR  DIET: heart healthy, carb modified     Brief Hospitalization Summary: Please see all hospital notes, images, labs for full details of the hospitalization. ADMISSION HPI: Cory Weber is a 70 y.o. male with medical history significant for COPD, long history of tobacco abuse, CAD, peripheral vascular disease status post bilateral lower extremity amputations, type 2 diabetes mellitus, congestive heart, chronic supplemental oxygen dependence, long-term resident at [facility], history of cerebrovascular disease, history of gangrene, hyperlipidemia, peripheral neuropathy, stage IIIb CKD, reportedly has been having increasing shortness of breath symptoms for the past month.  He reports his symptoms became acutely worse in the last 24 hours.  He reports increasing oxygen requirement.  He reports increasing cough and chest congestion.  He denies fever and chills.  He reports chest pain he describes his chest pain as reproducible pain associated with coughing and taking a deep breath.  He has been taking medications regularly as he is a long-term SNF resident he has a history of polysubstance abuse point denies any alcohol or recreational drug use since being at the SNF.  He was recently discharged about a month  ago.  ED Course:Patient was somnolent on arrival, VBG with signs of PCO2 74.4, PO2 45.9, HCO3 34.6, high-sensitivity troponin 18, 19, portable chest x-ray with no findings of pulmonary edema or infiltrate.  Sodium 133, potassium 4.7, glucose 124, creatinine 1.86, cardiac BNP to 11.0, WBC 6.0, hemoglobin 8.0 platelet count 196.  Patient was placed on BiPAP therapy.  Hospital admission was requested for further management.  Hospital Course  Acute on chronic respiratory failure with hypoxia and hypercarbia secondary to COPD with acute exacerbation-Treated with IV steroids IV antibiotics.  He also was treated with scheduled bronchodilators. He was initially on bipap but that was weaned and he is on 3L [place].  He says that he is feeling much better today and breathing better and asking to discharge.  He will discharge on oral steroid, antibiotic, bronchodilator and mucinex.     Atypical chest pain-symptoms resolved with pleurisy associated with frequent coughing. High-sensitivity troponin tests have ruled out for ACS, do not suspect ACS.  CAD - resumed home cardiac medications.     History of gastritis and duodenitis-IV Protonix ordered for GI protection  Chronic diastolic heart failure-patient is compensated with no findings of acute heart failure exacerbation.  Resume home meds.   DNR present on admission we will continue DNR order while inpatient.  Controlled type 2 diabetes mellitus with peripheral neuropathy as evidenced by hemoglobin A1c less than 7%-supplemental sliding scale coverage ordered with frequent CBG testing.  He will discharge on basal bolus insulin with instructions to titrate dose as needed for better glycemic control.  When he completes the steroid course his insulin doses might need to be reduced back to avoid hypoglycemia.  Please test blood glucose at least 4 times per day and as needed when not feeling well.    Anemia in chronic kidney disease-hemoglobin stable at 8.0  from recent hospitalization.  Stage IIIb CKD-stable from recent testing.  Chronic gout-stable resume home medications and follow clinically.  Resume  Home medication.   Phantom limb pain-symptomatic management with gabapentin.    DVT prophylaxis:Heparin subcu Code Status:DNR Family Communication:sister by telephone update Disposition Plan:return to [facility] SNF   Discharge Diagnoses:  Principal Problem:   COPD with acute exacerbation ([place]) Active Problems:   Acute respiratory failure with hypoxia and hypercarbia (HCC)   Hemiparesthesia   Polysubstance abuse (HCC)   S/P lumbar spinal fusion   Chronic kidney disease, stage 3 (HCC)   Essential hypertension   Chronic hepatitis C without hepatic coma (HCC)   History of CVA (cerebrovascular accident) without residual deficits   Phantom limb pain (HCC)   Anemia of chronic disease   HLD (hyperlipidemia)   BPH (benign prostatic hyperplasia)   ETOH abuse   PVD (peripheral vascular disease) (HCC)   GERD (gastroesophageal reflux disease)   Cough   Uncontrolled type II diabetes with peripheral autonomic neuropathy (HCC)   Type 2 diabetes with nephropathy (HCC)   Chronic gout due to renal impairment involving toe of right foot without tophus   Physical deconditioning   Chronic diastolic CHF (congestive heart failure) (HCC)   Constipation due to opioid therapy   PAD (peripheral artery disease) ([place])   DNR (do not resuscitate)   Gastritis and gastroduodenitis   Grade I internal hemorrhoids   AVM (arteriovenous malformation) of small bowel, acquired   COPD (chronic obstructive pulmonary disease) ([place])   Discharge Instructions: Discharge Instructions    Ambulatory referral to Sleep Studies   Complete by: As directed      Allergies as of 06/12/2020      Reactions   No Known Allergies       Medication List    STOP taking these medications   ferrous sulfate 325 (65 FE) MG tablet   insulin aspart 100 UNIT/ML  injection Commonly known as: novoLOG   pantoprazole 40 MG tablet Commonly known as: PROTONIX   polyethylene glycol 17 g packet Commonly known as: MIRALAX / GLYCOLAX   tamsulosin 0.4 MG Caps capsule Commonly known as: FLOMAX     TAKE these medications   acetaminophen 325 MG tablet Commonly known as: TYLENOL Take 2 tablets (650 mg total) by mouth every 6 (six) hours as needed for mild pain or headache (fever >/= 101).   aspirin EC 81 MG tablet Take 1 tablet (81 mg total) by mouth daily with breakfast.   baclofen 10 MG tablet Commonly known as: LIORESAL Take 1 tablet (10 mg total) by mouth every 8 (eight) hours as needed for muscle spasms. For shoulder spasm   carvedilol 25 MG tablet Commonly known as: COREG Take 1 tablet (25 mg total) by mouth 2 (two) times daily with a meal.   clopidogrel 75 MG tablet Commonly known as: PLAVIX Take 75 mg by mouth daily.   cyanocobalamin 1000 MCG tablet Take 500 mcg by mouth daily.   doxycycline 100 MG capsule Commonly known as: VIBRAMYCIN Take 1 capsule (100 mg total) by mouth 2 (two) times daily for 3 days.   DULoxetine 60 MG capsule Commonly known as: CYMBALTA Take 60 mg by mouth daily.   ferrous gluconate 324 MG tablet Commonly known as: FERGON Take 324 mg by mouth daily with breakfast.   furosemide  20 MG tablet Commonly known as: LASIX Take 80 mg by mouth daily.   gabapentin 300 MG capsule Commonly known as: NEURONTIN Take 300 mg by mouth 3 (three) times daily.   guaiFENesin 600 MG 12 hr tablet Commonly known as: MUCINEX Take 2 tablets (1,200 mg total) by mouth 2 (two) times daily for 5 days.   hydrALAZINE 50 MG tablet Commonly known as: APRESOLINE Take 1 tablet (50 mg total) by mouth every 8 (eight) hours. What changed: when to take this   insulin glargine 100 UNIT/ML injection Commonly known as: LANTUS Inject 0.2 mLs (20 Units total) into the skin at bedtime. What changed: how much to take   insulin lispro  100 UNIT/ML injection Commonly known as: HUMALOG Inject 0.08 mLs (8 Units total) into the skin 3 (three) times daily before meals. Give only if eats 50% or more of meal. What changed:   how much to take  additional instructions   ipratropium-albuterol 0.5-2.5 (3) MG/3ML Soln Commonly known as: DUONEB Take 3 mLs by nebulization every 6 (six) hours as needed (SOB/ wheezing).   isosorbide mononitrate 120 MG 24 hr tablet Commonly known as: IMDUR Take 1 tablet (120 mg total) by mouth daily.   Lidocaine 4 % Ptch Apply 1 patch topically daily. Apply 1 patch to both stumps once daily, 12 hour use   melatonin 5 MG Tabs Take 1 tablet by mouth at bedtime.   predniSONE 20 MG tablet Commonly known as: DELTASONE Take 2 tablets (40 mg total) by mouth daily with breakfast for 5 days.   rosuvastatin 20 MG tablet Commonly known as: CRESTOR Take 1 tablet (20 mg total) by mouth daily.       Contact information for after-discharge care    [facility] .   Service: Skilled Nursing Contact information: [place] [phone]                 Allergies  Allergen Reactions  . No Known Allergies    Allergies as of 06/12/2020      Reactions   No Known Allergies       Medication List    STOP taking these medications   ferrous sulfate 325 (65 FE) MG tablet   insulin aspart 100 UNIT/ML injection Commonly known as: novoLOG   pantoprazole 40 MG tablet Commonly known as: PROTONIX   polyethylene glycol 17 g packet Commonly known as: MIRALAX / GLYCOLAX   tamsulosin 0.4 MG Caps capsule Commonly known as: FLOMAX     TAKE these medications   acetaminophen 325 MG tablet Commonly known as: TYLENOL Take 2 tablets (650 mg total) by mouth every 6 (six) hours as needed for mild pain or headache (fever >/= 101).   aspirin EC 81 MG tablet Take 1 tablet (81 mg total) by mouth daily with breakfast.    baclofen 10 MG tablet Commonly known as: LIORESAL Take 1 tablet (10 mg total) by mouth every 8 (eight) hours as needed for muscle spasms. For shoulder spasm   carvedilol 25 MG tablet Commonly known as: COREG Take 1 tablet (25 mg total) by mouth 2 (two) times daily with a meal.   clopidogrel 75 MG tablet Commonly known as: PLAVIX Take 75 mg by mouth daily.   cyanocobalamin 1000 MCG tablet Take 500 mcg by mouth daily.   doxycycline 100 MG capsule Commonly known as: VIBRAMYCIN Take 1 capsule (100 mg total) by mouth 2 (two) times daily  for 3 days.   DULoxetine 60 MG capsule Commonly known as: CYMBALTA Take 60 mg by mouth daily.   ferrous gluconate 324 MG tablet Commonly known as: FERGON Take 324 mg by mouth daily with breakfast.   furosemide 20 MG tablet Commonly known as: LASIX Take 80 mg by mouth daily.   gabapentin 300 MG capsule Commonly known as: NEURONTIN Take 300 mg by mouth 3 (three) times daily.   guaiFENesin 600 MG 12 hr tablet Commonly known as: MUCINEX Take 2 tablets (1,200 mg total) by mouth 2 (two) times daily for 5 days.   hydrALAZINE 50 MG tablet Commonly known as: APRESOLINE Take 1 tablet (50 mg total) by mouth every 8 (eight) hours. What changed: when to take this   insulin glargine 100 UNIT/ML injection Commonly known as: LANTUS Inject 0.2 mLs (20 Units total) into the skin at bedtime. What changed: how much to take   insulin lispro 100 UNIT/ML injection Commonly known as: HUMALOG Inject 0.08 mLs (8 Units total) into the skin 3 (three) times daily before meals. Give only if eats 50% or more of meal. What changed:   how much to take  additional instructions   ipratropium-albuterol 0.5-2.5 (3) MG/3ML Soln Commonly known as: DUONEB Take 3 mLs by nebulization every 6 (six) hours as needed (SOB/ wheezing).   isosorbide mononitrate 120 MG 24 hr tablet Commonly known as: IMDUR Take 1 tablet (120 mg total) by mouth daily.   Lidocaine 4 %  Ptch Apply 1 patch topically daily. Apply 1 patch to both stumps once daily, 12 hour use   melatonin 5 MG Tabs Take 1 tablet by mouth at bedtime.   predniSONE 20 MG tablet Commonly known as: DELTASONE Take 2 tablets (40 mg total) by mouth daily with breakfast for 5 days.   rosuvastatin 20 MG tablet Commonly known as: CRESTOR Take 1 tablet (20 mg total) by mouth daily.       Procedures/Studies: DG Chest Port 1 View  Result Date: 06/10/2020 CLINICAL DATA:  Chest pain and shortness of breath onset several days. EXAM: PORTABLE CHEST 1 VIEW COMPARISON:  Chest x-rays dated 04/28/2020, 04/26/2020 and 03/20/2020. FINDINGS: Borderline cardiomegaly. Lungs are clear. No pleural effusion or pneumothorax is seen. Osseous structures about the chest are unremarkable. IMPRESSION: 1. Borderline cardiomegaly. 2. No evidence of pneumonia or pulmonary edema. 3. Aortic atherosclerosis. Electronically Signed   By: Franki Cabot M.D.   On: 06/10/2020 13:06     Subjective: Pt reports that he is breathing much better now and feels back to his baseline, he wants to discharge today.  No CP, no SOB.   Discharge Exam: Vitals:   06/12/20 1117 06/12/20 1207  BP:  (!) 144/69  Pulse: 63 67  Resp: 14 17  Temp: 97.7 F (36.5 C)   SpO2: 99% 99%   Vitals:   06/12/20 1000 06/12/20 1100 06/12/20 1117 06/12/20 1207  BP: (!) 152/70 (!) 172/97  (!) 144/69  Pulse: 71 74 63 67  Resp: 18 19 14 17   Temp:   97.7 F (36.5 C)   TempSrc:   Oral   SpO2: 98% 100% 99% 99%  Weight:      Height:       General exam: chronically ill appearing, Appears calm and comfortable  Respiratory system: diffuse insp/expiratory wheezing,cough and chest congestion. Cardiovascular system: normal S1 & S2 heard. No JVD, murmurs, rubs, gallops or clicks. No pedal edema. Gastrointestinal system: Abdomen is nondistended, soft and nontender. No organomegaly or masses felt.  Normal bowel sounds heard. Central nervous system: Alert and  oriented. No focal neurological deficits. Extremities: bilateral LE amputee, right AKA and left BKA.   Skin: No rashes, lesions or ulcers Psychiatry: Judgement and insight appear normal. Mood & affect appropriate.   The results of significant diagnostics from this hospitalization (including imaging, microbiology, ancillary and laboratory) are listed below for reference.     Microbiology: Recent Results (from the past 240 hour(s))  Resp Panel by RT-PCR (Flu A&B, Covid) Nasopharyngeal Swab     Status: None   Collection Time: 06/10/20  3:59 PM   Specimen: Nasopharyngeal Swab; Nasopharyngeal(NP) swabs in vial transport medium  Result Value Ref Range Status   SARS Coronavirus 2 by RT PCR NEGATIVE NEGATIVE Final    Comment: (NOTE) SARS-CoV-2 target nucleic acids are NOT DETECTED.  The SARS-CoV-2 RNA is generally detectable in upper respiratory specimens during the acute phase of infection. The lowest concentration of SARS-CoV-2 viral copies this assay can detect is 138 copies/mL. A negative result does not preclude SARS-Cov-2 infection and should not be used as the sole basis for treatment or other patient management decisions. A negative result may occur with  improper specimen collection/handling, submission of specimen other than nasopharyngeal swab, presence of viral mutation(s) within the areas targeted by this assay, and inadequate number of viral copies(<138 copies/mL). A negative result must be combined with clinical observations, patient history, and epidemiological information. The expected result is Negative.  Fact Sheet for Patients:  EntrepreneurPulse.com.au  Fact Sheet for Healthcare Providers:  IncredibleEmployment.be  This test is no t yet approved or cleared by the [place] FDA and  has been authorized for detection and/or diagnosis of SARS-CoV-2 by FDA under an Emergency Use Authorization (EUA). This EUA will remain  in  effect (meaning this test can be used) for the duration of the COVID-19 declaration under Section 564(b)(1) of the Act, 21 U.S.C.section 360bbb-3(b)(1), unless the authorization is terminated  or revoked sooner.       Influenza A by PCR NEGATIVE NEGATIVE Final   Influenza B by PCR NEGATIVE NEGATIVE Final    Comment: (NOTE) The Xpert Xpress SARS-CoV-2/FLU/RSV plus assay is intended as an aid in the diagnosis of influenza from Nasopharyngeal swab specimens and should not be used as a sole basis for treatment. Nasal washings and aspirates are unacceptable for Xpert Xpress SARS-CoV-2/FLU/RSV testing.  Fact Sheet for Patients: EntrepreneurPulse.com.au  Fact Sheet for Healthcare Providers: IncredibleEmployment.be  This test is not yet approved or cleared by the [place] FDA and has been authorized for detection and/or diagnosis of SARS-CoV-2 by FDA under an Emergency Use Authorization (EUA). This EUA will remain in effect (meaning this test can be used) for the duration of the COVID-19 declaration under Section 564(b)(1) of the Act, 21 U.S.C. section 360bbb-3(b)(1), unless the authorization is terminated or revoked.  Performed at [facility], [address] [place], [place] [address]   MRSA PCR Screening     Status: Abnormal   Collection Time: 06/10/20 11:46 PM   Specimen: Nasal Mucosa; Nasopharyngeal  Result Value Ref Range Status   MRSA by PCR POSITIVE (A) NEGATIVE Final    Comment:        The GeneXpert MRSA Assay (FDA approved for NASAL specimens only), is one component of a comprehensive MRSA colonization surveillance program. It is not intended to diagnose MRSA infection nor to guide or monitor treatment for MRSA infections. RESULT CALLED TO, READ BACK BY AND VERIFIED WITH: CODY KINDLEY 5/1 @ 0310 BY  S BEARD Performed at [facility], [address], [place], [place] [address]      Labs: BNP (last 3 results) Recent Labs     03/20/20 0119 04/27/20 0009 06/10/20 1226  BNP 213.0* 352.0* 573.2*   Basic Metabolic Panel: Recent Labs  Lab 06/10/20 1226 06/11/20 0344 06/12/20 0624  NA 133* 131* 129*  K 4.0 4.2 4.0  CL 91* 92* 93*  CO2 34* 31 31  GLUCOSE 194* 282* 344*  BUN 32* 47* 45*  CREATININE 1.86* 2.00* 1.65*  CALCIUM 9.0 9.0 9.0  MG  --  2.1 2.1   Liver Function Tests: Recent Labs  Lab 06/11/20 0344 06/12/20 0624  AST 13* 13*  ALT 12 12  ALKPHOS 46 43  BILITOT 0.4 0.5  PROT 10.3* 10.1*  ALBUMIN 2.9* 2.8*   No results for input(s): LIPASE, AMYLASE in the last 168 hours. No results for input(s): AMMONIA in the last 168 hours. CBC: Recent Labs  Lab 06/10/20 1226 06/11/20 0344  WBC 6.0 6.4  NEUTROABS  --  5.2  HGB 8.0* 8.4*  HCT 25.9* 26.5*  MCV 99.6 97.8  PLT 196 235   Cardiac Enzymes: No results for input(s): CKTOTAL, CKMB, CKMBINDEX, TROPONINI in the last 168 hours. BNP: Invalid input(s): POCBNP CBG: Recent Labs  Lab 06/11/20 2002 06/11/20 2321 06/12/20 0327 06/12/20 0732 06/12/20 1119  GLUCAP 460* 368* 314* 316* 390*   D-Dimer No results for input(s): DDIMER in the last 72 hours. Hgb A1c No results for input(s): HGBA1C in the last 72 hours. Lipid Profile No results for input(s): CHOL, HDL, LDLCALC, TRIG, CHOLHDL, LDLDIRECT in the last 72 hours. Thyroid function studies No results for input(s): TSH, T4TOTAL, T3FREE, THYROIDAB in the last 72 hours.  Invalid input(s): FREET3 Anemia work up No results for input(s): VITAMINB12, FOLATE, FERRITIN, TIBC, IRON, RETICCTPCT in the last 72 hours. Urinalysis    Component Value Date/Time   COLORURINE YELLOW 03/27/2020 1328   APPEARANCEUR CLEAR 03/27/2020 1328   LABSPEC 1.010 03/27/2020 1328   PHURINE 5.0 03/27/2020 1328   GLUCOSEU NEGATIVE 03/27/2020 1328   HGBUR NEGATIVE 03/27/2020 [place] 03/27/2020 [place] 03/27/2020 1328   PROTEINUR NEGATIVE 03/27/2020 1328   UROBILINOGEN 1.0  11/29/2013 1705   NITRITE NEGATIVE 03/27/2020 1328   LEUKOCYTESUR TRACE (A) 03/27/2020 1328   Sepsis Labs Invalid input(s): PROCALCITONIN,  WBC,  LACTICIDVEN Microbiology Recent Results (from the past 240 hour(s))  Resp Panel by RT-PCR (Flu A&B, Covid) Nasopharyngeal Swab     Status: None   Collection Time: 06/10/20  3:59 PM   Specimen: Nasopharyngeal Swab; Nasopharyngeal(NP) swabs in vial transport medium  Result Value Ref Range Status   SARS Coronavirus 2 by RT PCR NEGATIVE NEGATIVE Final    Comment: (NOTE) SARS-CoV-2 target nucleic acids are NOT DETECTED.  The SARS-CoV-2 RNA is generally detectable in upper respiratory specimens during the acute phase of infection. The lowest concentration of SARS-CoV-2 viral copies this assay can detect is 138 copies/mL. A negative result does not preclude SARS-Cov-2 infection and should not be used as the sole basis for treatment or other patient management decisions. A negative result may occur with  improper specimen collection/handling, submission of specimen other than nasopharyngeal swab, presence of viral mutation(s) within the areas targeted by this assay, and inadequate number of viral copies(<138 copies/mL). A negative result must be combined with clinical observations, patient history, and epidemiological information. The expected result is Negative.  Fact Sheet for Patients:  EntrepreneurPulse.com.au  Fact Sheet for Healthcare Providers:  IncredibleEmployment.be  This test is no t yet approved or cleared by the [place] FDA and  has been authorized for detection and/or diagnosis of SARS-CoV-2 by FDA under an Emergency Use Authorization (EUA). This EUA will remain  in effect (meaning this test can be used) for the duration of the COVID-19 declaration under Section 564(b)(1) of the Act, 21 U.S.C.section 360bbb-3(b)(1), unless the authorization is terminated  or revoked sooner.        Influenza A by PCR NEGATIVE NEGATIVE Final   Influenza B by PCR NEGATIVE NEGATIVE Final    Comment: (NOTE) The Xpert Xpress SARS-CoV-2/FLU/RSV plus assay is intended as an aid in the diagnosis of influenza from Nasopharyngeal swab specimens and should not be used as a sole basis for treatment. Nasal washings and aspirates are unacceptable for Xpert Xpress SARS-CoV-2/FLU/RSV testing.  Fact Sheet for Patients: EntrepreneurPulse.com.au  Fact Sheet for Healthcare Providers: IncredibleEmployment.be  This test is not yet approved or cleared by the [place] FDA and has been authorized for detection and/or diagnosis of SARS-CoV-2 by FDA under an Emergency Use Authorization (EUA). This EUA will remain in effect (meaning this test can be used) for the duration of the COVID-19 declaration under Section 564(b)(1) of the Act, 21 U.S.C. section 360bbb-3(b)(1), unless the authorization is terminated or revoked.  Performed at [facility], [address], [place], [place] [address]   MRSA PCR Screening     Status: Abnormal   Collection Time: 06/10/20 11:46 PM   Specimen: Nasal Mucosa; Nasopharyngeal  Result Value Ref Range Status   MRSA by PCR POSITIVE (A) NEGATIVE Final    Comment:        The GeneXpert MRSA Assay (FDA approved for NASAL specimens only), is one component of a comprehensive MRSA colonization surveillance program. It is not intended to diagnose MRSA infection nor to guide or monitor treatment for MRSA infections. RESULT CALLED TO, READ BACK BY AND VERIFIED WITH: CODY KINDLEY 5/1 @ 0349 BY S BEARD Performed at [facility], [address] [place], [place] [address]    Time coordinating discharge: 40 mins  SIGNED:  Irwin Brakeman, MD  [facility] 06/12/2020, 2:28 PM How to contact the [facility] Attending or Consulting provider [place] or covering provider during after hours [place], for this patient?  1. Check the care team in  [facility] and look for a) attending/consulting TR[facility] provider listed and b) the [facility] team listed 2. Log into [email or web address] and use [place]'s universal password to access. If you do not have the password, please contact the hospital operator. 3. Locate the [facility] provider you are looking for under [facility] and page to a number that you can be directly reached. 4. If you still have difficulty reaching the provider, please page the [facility] (Director on Call) for the Hospitalists listed on amion for assistance.

## 2020-06-13 ENCOUNTER — Emergency Department (HOSPITAL_COMMUNITY)
Admission: EM | Admit: 2020-06-13 | Discharge: 2020-06-14 | Disposition: A | Discharge status: Home / Self Care | Payer: Medicare (Managed Care) | Attending: Emergency Medicine | Admitting: Emergency Medicine

## 2020-06-13 ENCOUNTER — Inpatient Hospital Stay (HOSPITAL_COMMUNITY): Payer: Medicare (Managed Care) | Attending: Hematology | Admitting: Hematology

## 2020-06-13 ENCOUNTER — Encounter (HOSPITAL_COMMUNITY): Payer: Self-pay | Admitting: Emergency Medicine

## 2020-06-13 ENCOUNTER — Other Ambulatory Visit: Payer: Self-pay

## 2020-06-13 ENCOUNTER — Emergency Department (HOSPITAL_COMMUNITY): Payer: Medicare (Managed Care)

## 2020-06-13 VITALS — BP 143/108 | HR 80 | Temp 97.0°F | Resp 22

## 2020-06-13 DIAGNOSIS — R059 Cough, unspecified: Secondary | ICD-10-CM | POA: Insufficient documentation

## 2020-06-13 DIAGNOSIS — E1151 Type 2 diabetes mellitus with diabetic peripheral angiopathy without gangrene: Secondary | ICD-10-CM

## 2020-06-13 DIAGNOSIS — Z794 Long term (current) use of insulin: Secondary | ICD-10-CM

## 2020-06-13 DIAGNOSIS — I5043 Acute on chronic combined systolic (congestive) and diastolic (congestive) heart failure: Secondary | ICD-10-CM | POA: Insufficient documentation

## 2020-06-13 DIAGNOSIS — Z87891 Personal history of nicotine dependence: Secondary | ICD-10-CM | POA: Insufficient documentation

## 2020-06-13 DIAGNOSIS — R0902 Hypoxemia: Secondary | ICD-10-CM | POA: Insufficient documentation

## 2020-06-13 DIAGNOSIS — E1122 Type 2 diabetes mellitus with diabetic chronic kidney disease: Secondary | ICD-10-CM | POA: Insufficient documentation

## 2020-06-13 DIAGNOSIS — J45909 Unspecified asthma, uncomplicated: Secondary | ICD-10-CM | POA: Insufficient documentation

## 2020-06-13 DIAGNOSIS — Z89511 Acquired absence of right leg below knee: Secondary | ICD-10-CM

## 2020-06-13 DIAGNOSIS — Z79899 Other long term (current) drug therapy: Secondary | ICD-10-CM | POA: Insufficient documentation

## 2020-06-13 DIAGNOSIS — J441 Chronic obstructive pulmonary disease with (acute) exacerbation: Secondary | ICD-10-CM | POA: Diagnosis not present

## 2020-06-13 DIAGNOSIS — J9611 Chronic respiratory failure with hypoxia: Secondary | ICD-10-CM

## 2020-06-13 DIAGNOSIS — I2510 Atherosclerotic heart disease of native coronary artery without angina pectoris: Secondary | ICD-10-CM | POA: Diagnosis not present

## 2020-06-13 DIAGNOSIS — Z8616 Personal history of COVID-19: Secondary | ICD-10-CM | POA: Diagnosis not present

## 2020-06-13 DIAGNOSIS — D649 Anemia, unspecified: Secondary | ICD-10-CM | POA: Diagnosis present

## 2020-06-13 DIAGNOSIS — Z993 Dependence on wheelchair: Secondary | ICD-10-CM

## 2020-06-13 DIAGNOSIS — Z7982 Long term (current) use of aspirin: Secondary | ICD-10-CM | POA: Insufficient documentation

## 2020-06-13 DIAGNOSIS — I130 Hypertensive heart and chronic kidney disease with heart failure and stage 1 through stage 4 chronic kidney disease, or unspecified chronic kidney disease: Secondary | ICD-10-CM | POA: Diagnosis not present

## 2020-06-13 DIAGNOSIS — Z89512 Acquired absence of left leg below knee: Secondary | ICD-10-CM

## 2020-06-13 DIAGNOSIS — Z803 Family history of malignant neoplasm of breast: Secondary | ICD-10-CM

## 2020-06-13 DIAGNOSIS — R062 Wheezing: Secondary | ICD-10-CM | POA: Insufficient documentation

## 2020-06-13 DIAGNOSIS — Z9981 Dependence on supplemental oxygen: Secondary | ICD-10-CM

## 2020-06-13 DIAGNOSIS — N183 Chronic kidney disease, stage 3 unspecified: Secondary | ICD-10-CM | POA: Insufficient documentation

## 2020-06-13 LAB — CBC WITH DIFFERENTIAL/PLATELET
Abs Immature Granulocytes: 0.09 10*3/uL — ABNORMAL HIGH (ref 0.00–0.07)
Basophils Absolute: 0 10*3/uL (ref 0.0–0.1)
Basophils Relative: 0 %
Eosinophils Absolute: 0 10*3/uL (ref 0.0–0.5)
Eosinophils Relative: 0 %
HCT: 26.8 % — ABNORMAL LOW (ref 39.0–52.0)
Hemoglobin: 8.4 g/dL — ABNORMAL LOW (ref 13.0–17.0)
Immature Granulocytes: 1 %
Lymphocytes Relative: 7 %
Lymphs Abs: 0.5 10*3/uL — ABNORMAL LOW (ref 0.7–4.0)
MCH: 30.8 pg (ref 26.0–34.0)
MCHC: 31.3 g/dL (ref 30.0–36.0)
MCV: 98.2 fL (ref 80.0–100.0)
Monocytes Absolute: 0.3 10*3/uL (ref 0.1–1.0)
Monocytes Relative: 4 %
Neutro Abs: 7.3 10*3/uL (ref 1.7–7.7)
Neutrophils Relative %: 88 %
Platelets: 311 10*3/uL (ref 150–400)
RBC: 2.73 MIL/uL — ABNORMAL LOW (ref 4.22–5.81)
RDW: 14.7 % (ref 11.5–15.5)
WBC: 8.3 10*3/uL (ref 4.0–10.5)
nRBC: 0.2 % (ref 0.0–0.2)

## 2020-06-13 LAB — COMPREHENSIVE METABOLIC PANEL
ALT: 14 U/L (ref 0–44)
AST: 14 U/L — ABNORMAL LOW (ref 15–41)
Albumin: 2.8 g/dL — ABNORMAL LOW (ref 3.5–5.0)
Alkaline Phosphatase: 47 U/L (ref 38–126)
Anion gap: 4 — ABNORMAL LOW (ref 5–15)
BUN: 40 mg/dL — ABNORMAL HIGH (ref 8–23)
CO2: 34 mmol/L — ABNORMAL HIGH (ref 22–32)
Calcium: 9.5 mg/dL (ref 8.9–10.3)
Chloride: 95 mmol/L — ABNORMAL LOW (ref 98–111)
Creatinine, Ser: 1.57 mg/dL — ABNORMAL HIGH (ref 0.61–1.24)
GFR, Estimated: 48 mL/min — ABNORMAL LOW (ref >60)
Glucose, Bld: 317 mg/dL — ABNORMAL HIGH (ref 70–99)
Potassium: 4 mmol/L (ref 3.5–5.1)
Sodium: 133 mmol/L — ABNORMAL LOW (ref 135–145)
Total Bilirubin: 0.5 mg/dL (ref 0.3–1.2)
Total Protein: 10.7 g/dL — ABNORMAL HIGH (ref 6.5–8.1)

## 2020-06-13 LAB — IRON AND TIBC
Iron: 32 ug/dL — ABNORMAL LOW (ref 45–182)
Saturation Ratios: 11 % — ABNORMAL LOW (ref 17.9–39.5)
TIBC: 283 ug/dL (ref 250–450)
UIBC: 251 ug/dL

## 2020-06-13 LAB — LACTATE DEHYDROGENASE: LDH: 104 U/L (ref 98–192)

## 2020-06-13 LAB — FOLATE: Folate: 11.1 ng/mL (ref >5.9)

## 2020-06-13 LAB — FERRITIN: Ferritin: 96 ng/mL (ref 24–336)

## 2020-06-13 LAB — VITAMIN B12: Vitamin B-12: 1143 pg/mL — ABNORMAL HIGH (ref 180–914)

## 2020-06-13 LAB — CBG MONITORING, ED: Glucose-Capillary: 236 mg/dL — ABNORMAL HIGH (ref 70–99)

## 2020-06-13 MED ORDER — ALBUTEROL SULFATE HFA 108 (90 BASE) MCG/ACT IN AERS: 4.0000 | INHALATION_SPRAY | Freq: Once | RESPIRATORY_TRACT | Status: DC

## 2020-06-13 MED ORDER — ALBUTEROL SULFATE HFA 108 (90 BASE) MCG/ACT IN AERS
4.0000 | INHALATION_SPRAY | Freq: Once | RESPIRATORY_TRACT | Status: AC
  Administered 2020-06-13: 4 via RESPIRATORY_TRACT
  Filled 2020-06-13: qty 6.7

## 2020-06-13 MED ORDER — IPRATROPIUM BROMIDE HFA 17 MCG/ACT IN AERS: 2.0000 | INHALATION_SPRAY | Freq: Once | RESPIRATORY_TRACT | Status: DC

## 2020-06-13 MED ORDER — IPRATROPIUM BROMIDE 0.02 % IN SOLN: 0.5000 mg | Freq: Once | RESPIRATORY_TRACT | Status: DC

## 2020-06-13 NOTE — Progress Notes (Signed)
Labs drawn per Dr. Delton Coombes order. Patient report given to Jerene Pitch, RN in ED as patient is being transported to ED for decreased O2 sats and lethargy.
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
## 2020-06-13 NOTE — ED Triage Notes (Signed)
Pt sent from cancer center for low oxygen levels. Pt d/c yesterday for acute respiratory failure.

## 2020-06-13 NOTE — Discharge Instructions (Addendum)
It was our pleasure to provide your ER care today - we hope that you feel better.  Use albuterol treatment as need. Avoid any smoking. Continue home oxygen.  Follow up with your doctor in the next few days - if not done already, discussed possible evaluation for sleep apnea and cpap at night.   Return to ER if worse, new symptoms, chest pain, increased trouble breathing, fevers, or other concern.

## 2020-06-13 NOTE — ED Notes (Signed)
Patient to xray at this time

## 2020-06-13 NOTE — ED Provider Notes (Signed)
[facility] EMERGENCY DEPARTMENT Provider Note   CSN: [id number] Arrival date & time: 06/13/20  1525     History Chief Complaint  Patient presents with  . low oxygen level    Cory WRIGHTSMAN is a 70 y.o. male.  Patient with hx copd, chf, home o2, anemia, recent admission for acute/chronic resp failure, d/c yesterday, was at outpatient visit today for anemia. Was noted to have o2 sats in 80s on his normal home o2, and so was sent to ED. Symptoms gradual onset, moderate, persistent, lower when sleeping. Pt denies increased sob. No chest pain or discomfort. Occasional non prod cough. No sore throat or runny nose. No fever or chills. Denies leg pain or swelling (prior bil bka). States compliant w meds.   The history is provided by the patient and medical records.       Past Medical History:  Diagnosis Date  . Acute anemia 06/01/2020  . Anemia   . Asthma   . BPH (benign prostatic hyperplasia)   . CAD (coronary artery disease)    Severe multivessel disease 03/2020 - poor revascularization options  . Chronic diastolic heart failure ([place])   . CKD (chronic kidney disease) stage 3, GFR 30-59 ml/min (HCC)   . COPD (chronic obstructive pulmonary disease) ([place])   . CVA (cerebral vascular accident) ([place]) 2015  . Essential hypertension   . Gangrene ([place]) 06/10/2016  . GERD (gastroesophageal reflux disease)   . Hepatitis C   . History of pneumonia   . Hyperlipidemia   . Osteomyelitis ([place])   . PAD (peripheral artery disease) (HCC)    Bilateral BKA  . Peripheral neuropathy   . Type II diabetes mellitus [facility])     Patient Active Problem List   Diagnosis Date Noted  . COPD with acute exacerbation ([place]) 06/10/2020  . Acute respiratory failure with hypoxia and hypercarbia ([place]) 06/10/2020  . COPD (chronic obstructive pulmonary disease) ([place])   . Acute on chronic anemia 06/01/2020  . Class 2 obesity due to excess calories with body mass index (BMI) of 35.0 to 35.9 in adult   . Acute on  chronic combined systolic and diastolic congestive heart failure ([place]) 04/27/2020  . AVM (arteriovenous malformation) of small bowel, acquired   . Gastritis and gastroduodenitis   . Gastric diverticulum   . Grade I internal hemorrhoids   . Occult blood in stools   . Heart failure ([place]) 03/20/2020  . NSTEMI (non-ST elevated myocardial infarction) ([place]) 03/20/2020  . Pneumonia due to COVID-19 virus 02/24/2019  . Acute on chronic respiratory failure with hypoxia ([place]) 02/20/2019  . COVID-19 virus detected 02/20/2019  . DNR (do not resuscitate) 02/20/2019  . PAD (peripheral artery disease) ([place]) 08/22/2016  . Osteomyelitis of foot ([place]) 07/08/2016  . S/P transmetatarsal amputation of foot, right ([place]) 06/26/2016  . Constipation due to opioid therapy 06/15/2016  . Physical deconditioning 06/10/2016  . Chronic diastolic CHF (congestive heart failure) ([place]) 06/10/2016  . Ischemic pain of right foot 06/04/2016  . Ischemia of foot 05/31/2016  . 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 Type 2 diabetes with nephropathy ([place]) 01/21/2016  . Uncontrolled type II diabetes with peripheral autonomic neuropathy ([place]) 01/17/2016  . Edema of right lower extremity 12/20/2015  . Cough 11/26/2015  . GERD (gastroesophageal reflux disease) 10/08/2015  . Amputation of left lower extremity below knee ([place]) 08/21/2015  . Phantom limb pain ([place])   . Anemia of chronic disease   .  HLD (hyperlipidemia)   . BPH (benign prostatic hyperplasia)   . ETOH abuse   . PVD (peripheral vascular disease) ([place])   . Chronic hepatitis C without hepatic coma ([place])   . History of CVA (cerebrovascular accident) without residual deficits   . Hyponatremia   . Chronic kidney disease, stage 3 ([place]) 07/18/2015  . Essential hypertension 07/18/2015  . Acute blood loss anemia 12/01/2013  . S/P lumbar spinal fusion 11/25/2013  . Melena 12/03/2011  . Hemiparesthesia 02/03/2011  .  Polysubstance abuse ([place]) 02/03/2011  . Tobacco abuse 02/03/2011    Past Surgical History:  Procedure Laterality Date  . ABDOMINAL AORTOGRAM N/A 06/03/2016   Procedure: Abdominal Aortogram;  Surgeon: Waynetta Sandy, MD;  Location: [place] CV LAB;  Service: Cardiovascular;  Laterality: N/A;  . AMPUTATION Left 08/16/2015   Procedure: LEFT BELOW THE KNEE AMPUTATION ;  Surgeon: Serafina Mitchell, MD;  Location: [place];  Service: Vascular;  Laterality: Left;  . AMPUTATION Right 08/22/2016   Procedure: AMPUTATION BELOW KNEE-RIGHT;  Surgeon: Waynetta Sandy, MD;  Location: [place];  Service: Vascular;  Laterality: Right;  . BACK SURGERY    . BELOW KNEE LEG AMPUTATION Left 08/16/2015  . BIOPSY N/A 09/03/2012   Procedure: BIOPSY;  Surgeon: Daneil Dolin, MD;  Location: [facility];  Service: Endoscopy;  Laterality: N/A;  gastric and gastric mucosa  . BIOPSY N/A 12/03/2012   Procedure: BIOPSY;  Surgeon: Daneil Dolin, MD;  [facility]: [facility];  Service: Endoscopy;  Laterality: N/A;  . BIOPSY  03/31/2020   Procedure: BIOPSY;  Surgeon: Ladene Artist, MD;  Location: [place];  Service: Endoscopy;;  . COLONOSCOPY WITH PROPOFOL N/A 09/03/2012   KZS:WFUXNAT polyp-removed as outlined above. Prominent internal hemorrhoids. Tubular adenoma  . COLONOSCOPY WITH PROPOFOL N/A 04/05/2020   Procedure: COLONOSCOPY WITH PROPOFOL;  Surgeon: Lavena Bullion, DO;  Location: [place];  Service: Gastroenterology;  Laterality: N/A;  . ENTEROSCOPY Left 04/05/2020   Procedure: ENTEROSCOPY;  Surgeon: Lavena Bullion, DO;  Location: [place];  Service: Gastroenterology;  Laterality: Left;  . ENTEROSCOPY N/A 04/07/2020   Procedure: ENTEROSCOPY;  Surgeon: Lavena Bullion, DO;  Location: [place];  Service: Gastroenterology;  Laterality: N/A;  . ESOPHAGOGASTRODUODENOSCOPY (EGD) WITH PROPOFOL N/A 09/03/2012   FTD:DUKGUR hernia. Gastric diverticulum. Gastric ulcers with associated erosions. Duodenal  erosions. Status post gastric biopsy. H.PYLORI gastritis   . ESOPHAGOGASTRODUODENOSCOPY (EGD) WITH PROPOFOL N/A 12/03/2012   Dr. Gala Romney: gastric diverticulum, gastric erosions and scar. Previously noted gastric ulcer completed healed. Biopsy without H.pylori.   . ESOPHAGOGASTRODUODENOSCOPY (EGD) WITH PROPOFOL N/A 01/23/2016   Procedure: ESOPHAGOGASTRODUODENOSCOPY (EGD) WITH PROPOFOL;  Surgeon: Doran Stabler, MD;  [facility]: [facility];  Service: Gastroenterology;  Laterality: N/A;  . ESOPHAGOGASTRODUODENOSCOPY (EGD) WITH PROPOFOL N/A 03/31/2020   Procedure: ESOPHAGOGASTRODUODENOSCOPY (EGD) WITH PROPOFOL;  Surgeon: Ladene Artist, MD;  Location: [facility];  Service: Endoscopy;  Laterality: N/A;  . ESOPHAGOGASTRODUODENOSCOPY (EGD) WITH PROPOFOL N/A 06/02/2020   Procedure: ESOPHAGOGASTRODUODENOSCOPY (EGD) WITH PROPOFOL;  Surgeon: Eloise Harman, DO;  [facility];  Service: Endoscopy;  Laterality: N/A;  . GIVENS CAPSULE STUDY N/A 04/05/2020   Procedure: GIVENS CAPSULE STUDY;  Surgeon: Lavena Bullion, DO;  Location: [place];  Service: Gastroenterology;  Laterality: N/A;  . HOT HEMOSTASIS N/A 04/07/2020   Procedure: HOT HEMOSTASIS (ARGON PLASMA COAGULATION/BICAP);  Surgeon: Lavena Bullion, DO;  Location: [facility];  Service: Gastroenterology;  Laterality: N/A;  . I & D EXTREMITY Right 07/11/2016  Procedure: DELAY PRIMARY CLOSURE FOOT AMPUTATION;  Surgeon: Edrick Kins, DPM;  Location: [place];  Service: Podiatry;  Laterality: Right;  . LEFT HEART CATH AND CORONARY ANGIOGRAPHY N/A 03/21/2020   Procedure: LEFT HEART CATH AND CORONARY ANGIOGRAPHY;  Surgeon: Martinique, Peter M, MD;  Location: [place] CV LAB;  Service: Cardiovascular;  Laterality: N/A;  . LIVER BIOPSY  2005   Done in [place], [place]. Chronic hepatitis with mild periportal inflammation, lobular unicellular necrosis and portal fibrosis. Grade 2, stage 1-2.  . LOWER EXTREMITY ANGIOGRAPHY Right 06/03/2016    Procedure: Lower Extremity Angiography;  Surgeon: Waynetta Sandy, MD;  Location: [place] CV LAB;  Service: Cardiovascular;  Laterality: Right;  . MAXIMUM ACCESS (MAS)POSTERIOR LUMBAR INTERBODY FUSION (PLIF) 1 LEVEL N/A 11/25/2013   Procedure: FOR MAXIMUM ACCESS (MAS) POSTERIOR LUMBAR INTERBODY FUSION (PLIF) 1 LEVEL;  Surgeon: Eustace Moore, MD;  Location: [place] [facility]UR[facility] ORS;  Service: Neurosurgery;  Laterality: N/A;  FOR MAXIMUM ACCESS (MAS) POSTERIOR LUMBAR INTERBODY FUSION (PLIF) 1 LEVEL LUMBAR 3-4  . PERIPHERAL VASCULAR ATHERECTOMY Right 06/03/2016   Procedure: Peripheral Vascular Atherectomy;  Surgeon: Waynetta Sandy, MD;  Location: [place] CV LAB;  Service: Cardiovascular;  Laterality: Right;  SFA WITH STENT  . PERIPHERAL VASCULAR CATHETERIZATION Left 08/01/2015   Procedure: Lower Extremity Angiography;  Surgeon: Serafina Mitchell, MD;  Location: [place] CV LAB;  Service: Cardiovascular;  Laterality: Left;  . PERIPHERAL VASCULAR CATHETERIZATION N/A 08/01/2015   Procedure: Abdominal Aortogram;  Surgeon: Serafina Mitchell, MD;  Location: [place] CV LAB;  Service: Cardiovascular;  Laterality: N/A;  . PERIPHERAL VASCULAR CATHETERIZATION N/A 08/08/2015   Procedure: Abdominal Aortogram w/Lower Extremity;  Surgeon: Serafina Mitchell, MD;  Location: [place] CV LAB;  Service: Cardiovascular;  Laterality: N/A;  . PERIPHERAL VASCULAR CATHETERIZATION Left 08/08/2015   Procedure: Peripheral Vascular Balloon Angioplasty;  Surgeon: Serafina Mitchell, MD;  Location: [place] CV LAB;  Service: Cardiovascular;  Laterality: Left;  left popiteal artery, left peronealtrunk, left post tibial  . POLYPECTOMY N/A 09/03/2012   Procedure: POLYPECTOMY;  Surgeon: Daneil Dolin, MD;  [facility];  Service: Endoscopy;  Laterality: N/A;  cecal polyp  . SUBMUCOSAL TATTOO INJECTION  04/07/2020   Procedure: SUBMUCOSAL TATTOO INJECTION;  Surgeon: Lavena Bullion, DO;  [facility];   Service: Gastroenterology;;  . TONSILLECTOMY    . TRANSMETATARSAL AMPUTATION Right 06/13/2016   Procedure: RIGHT TRANSMETATARSAL AMPUTATION;  Surgeon: Waynetta Sandy, MD;  Location: [place];  Service: Vascular;  Laterality: Right;  . TRANSMETATARSAL AMPUTATION Right 07/10/2016   Procedure: REVISION RIGHT TRANSMETATARSAL AMPUTATION;  Surgeon: Waynetta Sandy, MD;  Location: [facility];  Service: Vascular;  Laterality: Right;       Family History  Problem Relation Age of Onset  . Breast cancer Mother   . Heart failure Mother   . Diabetes Father   . Hypertension Father   . Heart disease Father   . Hyperlipidemia Father   . Diabetes Sister   . Hypertension Sister   . Breast cancer Sister   . Colon cancer Neg Hx   . Liver disease Neg Hx     Social History   Tobacco Use  . Smoking status: Former Smoker    Packs/day: 1.00    Years: 47.00    Pack years: 47.00    Types: Cigarettes    Quit date: 07/13/2015    Years since quitting: 4.9  . Smokeless tobacco: Never Used  Vaping Use  .  Vaping Use: Never used  Substance Use Topics  . Alcohol use: No    Alcohol/week: 0.0 standard drinks    Comment:  none 08/15/2015 "stopped drinking 3-4 years ago"  . Drug use: No    Comment: 08/15/2015 "last used cocaine 3-4 months ago"    Home Medications Prior to Admission medications   Medication Sig Start Date End Date Taking? Authorizing Provider  acetaminophen (TYLENOL) 325 MG tablet Take 2 tablets (650 mg total) by mouth every 6 (six) hours as needed for mild pain or headache (fever >/= 101). 02/24/19   Roxan Hockey, MD  aspirin EC 81 MG tablet Take 1 tablet (81 mg total) by mouth daily with breakfast. 02/24/19   Roxan Hockey, MD  baclofen (LIORESAL) 10 MG tablet Take 1 tablet (10 mg total) by mouth every 8 (eight) hours as needed for muscle spasms. For shoulder spasm 06/03/20   Manuella Ghazi, Pratik D, DO  carvedilol (COREG) 25 MG tablet Take 1 tablet (25 mg total) by mouth 2 (two) times  daily with a meal. 04/08/20   Regalado, Belkys A, MD  clopidogrel (PLAVIX) 75 MG tablet Take 75 mg by mouth daily.    [provider]  cyanocobalamin 1000 MCG tablet Take 500 mcg by mouth daily.     [provider]  doxycycline (VIBRAMYCIN) 100 MG capsule Take 1 capsule (100 mg total) by mouth 2 (two) times daily for 3 days. 06/12/20 06/15/20  Johnson, Clanford L, MD  DULoxetine (CYMBALTA) 60 MG capsule Take 60 mg by mouth daily.    [provider]  ferrous gluconate (FERGON) 324 MG tablet Take 324 mg by mouth daily with breakfast.    [provider]  furosemide (LASIX) 20 MG tablet Take 80 mg by mouth daily. 06/09/20   [provider]  gabapentin (NEURONTIN) 100 MG capsule Take by mouth. 06/03/20   [provider]  gabapentin (NEURONTIN) 300 MG capsule Take 300 mg by mouth 3 (three) times daily.    [provider]  guaiFENesin (MUCINEX) 600 MG 12 hr tablet Take 2 tablets (1,200 mg total) by mouth 2 (two) times daily for 5 days. 06/12/20 06/17/20  Johnson, Clanford L, MD  hydrALAZINE (APRESOLINE) 50 MG tablet Take 1 tablet (50 mg total) by mouth every 8 (eight) hours. Patient taking differently: Take 50 mg by mouth daily. 04/08/20   Regalado, Belkys A, MD  insulin glargine (LANTUS) 100 UNIT/ML injection Inject 0.2 mLs (20 Units total) into the skin at bedtime. 06/12/20   Johnson, Clanford L, MD  Insulin Glargine-yfgn 100 UNIT/ML SOPN Inject into the skin. 06/03/20   [provider]  insulin lispro (HUMALOG) 100 UNIT/ML injection Inject 0.08 mLs (8 Units total) into the skin 3 (three) times daily before meals. Give only if eats 50% or more of meal. 06/12/20   Johnson, Clanford L, MD  ipratropium-albuterol (DUONEB) 0.5-2.5 (3) MG/3ML SOLN Take 3 mLs by nebulization every 6 (six) hours as needed (SOB/ wheezing).    [provider]  isosorbide mononitrate (IMDUR) 120 MG 24 hr tablet Take 1 tablet (120 mg total) by mouth daily. 04/08/20    Regalado, Belkys A, MD  Lidocaine 4 % PTCH Apply 1 patch topically daily. Apply 1 patch to both stumps once daily, 12 hour use    [provider]  Melatonin 5 MG TABS Take 1 tablet by mouth at bedtime.    [provider]  predniSONE (DELTASONE) 20 MG tablet Take 2 tablets (40 mg total) by mouth daily with  breakfast for 5 days. 06/12/20 06/17/20  Johnson, Clanford L, MD  rosuvastatin (CRESTOR) 20 MG tablet Take 1 tablet (20 mg total) by mouth daily. 04/08/20   Regalado, Jerald Kief A, MD    Allergies    No known allergies  Review of Systems   Review of Systems  Constitutional: Negative for fever.  HENT: Negative for sore throat.   Eyes: Negative for redness.  Respiratory: Positive for cough. Negative for shortness of breath.   Cardiovascular: Negative for chest pain.  Gastrointestinal: Negative for abdominal pain and vomiting.  Genitourinary: Negative for flank pain.  Musculoskeletal: Negative for back pain and neck pain.  Skin: Negative for rash.  Neurological: Negative for headaches.  Hematological: Does not bruise/bleed easily.  Psychiatric/Behavioral: Negative for confusion.    Physical Exam Updated Vital Signs BP 114/76 (BP Location: Left Arm)   Pulse 66   Resp 16   Ht 1.854 m (6\' 1" )   Wt 114 kg   SpO2 (!) 74%   BMI 33.16 kg/m   Physical Exam Vitals and nursing note reviewed.  Constitutional:      Appearance: Normal appearance. He is well-developed.  HENT:     Head: Atraumatic.     Nose: Nose normal.     Mouth/Throat:     Mouth: Mucous membranes are moist.     Pharynx: Oropharynx is clear.  Eyes:     General: No scleral icterus.    Conjunctiva/sclera: Conjunctivae normal.     Pupils: Pupils are equal, round, and reactive to light.  Neck:     Trachea: No tracheal deviation.  Cardiovascular:     Rate and Rhythm: Normal rate and regular rhythm.     Pulses: Normal pulses.     Heart sounds: Normal heart sounds. No murmur heard. No friction rub. No  gallop.   Pulmonary:     Effort: Pulmonary effort is normal. No accessory muscle usage or respiratory distress.     Breath sounds: Wheezing present.  Abdominal:     General: Bowel sounds are normal. There is no distension.     Palpations: Abdomen is soft.     Tenderness: There is no abdominal tenderness. There is no guarding.  Genitourinary:    Comments: No cva tenderness. Musculoskeletal:        General: No swelling or tenderness.     Cervical back: Normal range of motion and neck supple. No rigidity.     Right lower leg: No edema.     Left lower leg: No edema.     Comments: bil bka  Skin:    General: Skin is warm and dry.     Findings: No rash.  Neurological:     Mental Status: He is alert.     Comments: Alert, speech clear.   Psychiatric:        Mood and Affect: Mood normal.     ED Results / Procedures / Treatments   Labs (all labs ordered are listed, but only abnormal results are displayed) Labs Reviewed - No data to display  EKG None  Radiology DG Chest 2 View  Result Date: 06/13/2020 CLINICAL DATA:  COPD, hypoxia EXAM: CHEST - 2 VIEW COMPARISON:  06/10/2020 FINDINGS: Frontal and lateral views of the chest demonstrate an unremarkable cardiac silhouette. No acute airspace disease, effusion, or pneumothorax. There are no acute bony abnormalities. IMPRESSION: 1. No acute intrathoracic process. Electronically Signed   By: Randa Ngo M.D.   On: 06/13/2020 16:26    Procedures Procedures   Medications  Ordered in ED Medications  albuterol (VENTOLIN HFA) 108 (90 Base) MCG/ACT inhaler 4 puff (has no administration in time range)  ipratropium (ATROVENT HFA) inhaler 2 puff (has no administration in time range)    ED Course  I have reviewed the triage vital signs and the nursing notes.  Pertinent labs & imaging results that were available during my care of the patient were reviewed by me and considered in my medical decision making (see chart for details).    MDM  Rules/Calculators/A&P                          Patient had labs yesterday, and from clinic today - hgb c/w baseline.   Reviewed nursing notes and prior charts for additional history.  Recent admission reviewed.   Will check cxr. Albuterol and atrovent treatment ordered.   CXR reviewed/interpreted by me - no pna.   After albuterol tx, no wheezing. Pulse ox 98%.  Pt is fully awake and alert, no lethargy or confusion. No increased wob. No pain.   Pt currently appears stable for d/c.   Rec pcp/pulm f/u.Marland Kitchen  Return precautions provided.      Final Clinical Impression(s) / ED Diagnoses Final diagnoses:  None    Rx / DC Orders ED Discharge Orders    None       Lajean Saver, MD 06/13/20 [phone]

## 2020-06-13 NOTE — ED Notes (Signed)
MD at bedside. 

## 2020-06-13 NOTE — Patient Instructions (Signed)
[place] at [facility] Discharge Instructions  You were seen and examined today by Dr. Delton Coombes. Dr. Delton Coombes is a medical oncologist, meaning he specializes in the management of cancer diagnoses with medication. Dr. Delton Coombes discussed your past medical history, family history of cancer and the events that led to you being here today.  You were originally referred to Dr. Delton Coombes regarding low blood counts (Hemoglobin). There is an abnormal protein present in your blood that is suspicious for Multiple Myeloma, this is a type of blood cancer. Dr. Delton Coombes has recommended additional blood work to check for this.  We will see you back in 3 weeks.   Thank you for choosing [place] at [facility] to provide your oncology and hematology care.  To afford each patient quality time with our provider, please arrive at least 15 minutes before your scheduled appointment time.   If you have a lab appointment with the [facility] please come in thru the Main Entrance and check in at the main information desk.  You need to re-schedule your appointment should you arrive 10 or more minutes late.  We strive to give you quality time with our providers, and arriving late affects you and other patients whose appointments are after yours.  Also, if you no show three or more times for appointments you may be dismissed from the clinic at the providers discretion.     Again, thank you for choosing [facility].  Our hope is that these requests will decrease the amount of time that you wait before being seen by our physicians.       _____________________________________________________________  Should you have questions after your visit to [facility], please contact our office at [phone] and follow the prompts.  Our office hours are 8:00 a.m. and 4:30 p.m. Monday - Friday.  Please note that voicemails left after 4:00 p.m. may not  be returned until the following business day.  We are closed weekends and major holidays.  You do have access to a nurse 24-7, just call the main number to the clinic [phone] and do not press any options, hold on the line and a nurse will answer the phone.    For prescription refill requests, have your pharmacy contact our office and allow 72 hours.    Due to Covid, you will need to wear a mask upon entering the hospital. If you do not have a mask, a mask will be given to you at the Main Entrance upon arrival. For doctor visits, patients may have 1 support person age 80 or older with them. For treatment visits, patients can not have anyone with them due to social distancing guidelines and our immunocompromised population.

## 2020-06-14 LAB — KAPPA/LAMBDA LIGHT CHAINS
Kappa free light chain: 22.8 mg/L — ABNORMAL HIGH (ref 3.3–19.4)
Kappa, lambda light chain ratio: 1.1 (ref 0.26–1.65)
Lambda free light chains: 20.8 mg/L (ref 5.7–26.3)

## 2020-06-14 LAB — BETA 2 MICROGLOBULIN, SERUM: Beta-2 Microglobulin: 3.7 mg/L — ABNORMAL HIGH (ref 0.6–2.4)

## 2020-06-15 LAB — PROTEIN ELECTROPHORESIS, SERUM
A/G Ratio: 0.5 — ABNORMAL LOW (ref 0.7–1.7)
Albumin ELP: 3.5 g/dL (ref 2.9–4.4)
Alpha-1-Globulin: 0.4 g/dL (ref 0.0–0.4)
Alpha-2-Globulin: 1.4 g/dL — ABNORMAL HIGH (ref 0.4–1.0)
Beta Globulin: 1 g/dL (ref 0.7–1.3)
Gamma Globulin: 4.2 g/dL — ABNORMAL HIGH (ref 0.4–1.8)
Globulin, Total: 6.9 g/dL — ABNORMAL HIGH (ref 2.2–3.9)
M-Spike, %: 4 g/dL — ABNORMAL HIGH
Total Protein ELP: 10.4 g/dL — ABNORMAL HIGH (ref 6.0–8.5)

## 2020-06-15 LAB — COPPER, SERUM: Copper: 168 ug/dL — ABNORMAL HIGH (ref 69–132)

## 2020-06-17 LAB — IMMUNOFIXATION ELECTROPHORESIS
IgA: 42 mg/dL — ABNORMAL LOW (ref 61–437)
IgG (Immunoglobin G), Serum: 5445 mg/dL — ABNORMAL HIGH (ref 603–1613)
IgM (Immunoglobulin M), Srm: 26 mg/dL (ref 20–172)
Total Protein ELP: 10.2 g/dL — ABNORMAL HIGH (ref 6.0–8.5)

## 2020-06-20 LAB — METHYLMALONIC ACID, SERUM: Methylmalonic Acid, Quantitative: 337 nmol/L (ref 0–378)

## 2020-06-23 ENCOUNTER — Encounter (HOSPITAL_COMMUNITY): Payer: Self-pay | Admitting: *Deleted

## 2020-06-23 ENCOUNTER — Other Ambulatory Visit: Payer: Self-pay

## 2020-06-23 ENCOUNTER — Observation Stay (HOSPITAL_COMMUNITY)
Admission: EM | Admit: 2020-06-23 | Discharge: 2020-06-24 | Disposition: A | Discharge status: Home / Self Care | Payer: Medicare (Managed Care) | Attending: Internal Medicine | Admitting: Internal Medicine

## 2020-06-23 DIAGNOSIS — J45909 Unspecified asthma, uncomplicated: Secondary | ICD-10-CM | POA: Diagnosis not present

## 2020-06-23 DIAGNOSIS — N183 Chronic kidney disease, stage 3 unspecified: Secondary | ICD-10-CM | POA: Diagnosis not present

## 2020-06-23 DIAGNOSIS — I130 Hypertensive heart and chronic kidney disease with heart failure and stage 1 through stage 4 chronic kidney disease, or unspecified chronic kidney disease: Secondary | ICD-10-CM | POA: Insufficient documentation

## 2020-06-23 DIAGNOSIS — K922 Gastrointestinal hemorrhage, unspecified: Secondary | ICD-10-CM

## 2020-06-23 DIAGNOSIS — Z20822 Contact with and (suspected) exposure to covid-19: Secondary | ICD-10-CM | POA: Insufficient documentation

## 2020-06-23 DIAGNOSIS — I5032 Chronic diastolic (congestive) heart failure: Secondary | ICD-10-CM | POA: Diagnosis not present

## 2020-06-23 DIAGNOSIS — Z87891 Personal history of nicotine dependence: Secondary | ICD-10-CM | POA: Insufficient documentation

## 2020-06-23 DIAGNOSIS — E785 Hyperlipidemia, unspecified: Secondary | ICD-10-CM | POA: Diagnosis present

## 2020-06-23 DIAGNOSIS — Z79899 Other long term (current) drug therapy: Secondary | ICD-10-CM | POA: Insufficient documentation

## 2020-06-23 DIAGNOSIS — Z7982 Long term (current) use of aspirin: Secondary | ICD-10-CM | POA: Insufficient documentation

## 2020-06-23 DIAGNOSIS — K5520 Angiodysplasia of colon without hemorrhage: Secondary | ICD-10-CM | POA: Diagnosis present

## 2020-06-23 DIAGNOSIS — J449 Chronic obstructive pulmonary disease, unspecified: Secondary | ICD-10-CM | POA: Diagnosis not present

## 2020-06-23 DIAGNOSIS — J9611 Chronic respiratory failure with hypoxia: Secondary | ICD-10-CM

## 2020-06-23 DIAGNOSIS — Z794 Long term (current) use of insulin: Secondary | ICD-10-CM | POA: Insufficient documentation

## 2020-06-23 DIAGNOSIS — E1121 Type 2 diabetes mellitus with diabetic nephropathy: Secondary | ICD-10-CM | POA: Diagnosis present

## 2020-06-23 DIAGNOSIS — N400 Enlarged prostate without lower urinary tract symptoms: Secondary | ICD-10-CM | POA: Diagnosis present

## 2020-06-23 DIAGNOSIS — I2510 Atherosclerotic heart disease of native coronary artery without angina pectoris: Secondary | ICD-10-CM | POA: Diagnosis not present

## 2020-06-23 DIAGNOSIS — Z7902 Long term (current) use of antithrombotics/antiplatelets: Secondary | ICD-10-CM | POA: Insufficient documentation

## 2020-06-23 DIAGNOSIS — D649 Anemia, unspecified: Secondary | ICD-10-CM | POA: Diagnosis present

## 2020-06-23 DIAGNOSIS — E1122 Type 2 diabetes mellitus with diabetic chronic kidney disease: Secondary | ICD-10-CM | POA: Insufficient documentation

## 2020-06-23 LAB — COMPREHENSIVE METABOLIC PANEL
ALT: 13 U/L (ref 0–44)
AST: 14 U/L — ABNORMAL LOW (ref 15–41)
Albumin: 2.3 g/dL — ABNORMAL LOW (ref 3.5–5.0)
Alkaline Phosphatase: 50 U/L (ref 38–126)
Anion gap: 3 — ABNORMAL LOW (ref 5–15)
BUN: 16 mg/dL (ref 8–23)
CO2: 35 mmol/L — ABNORMAL HIGH (ref 22–32)
Calcium: 8.4 mg/dL — ABNORMAL LOW (ref 8.9–10.3)
Chloride: 95 mmol/L — ABNORMAL LOW (ref 98–111)
Creatinine, Ser: 1.41 mg/dL — ABNORMAL HIGH (ref 0.61–1.24)
GFR, Estimated: 55 mL/min — ABNORMAL LOW (ref >60)
Glucose, Bld: 242 mg/dL — ABNORMAL HIGH (ref 70–99)
Potassium: 4.3 mmol/L (ref 3.5–5.1)
Sodium: 133 mmol/L — ABNORMAL LOW (ref 135–145)
Total Bilirubin: 0.4 mg/dL (ref 0.3–1.2)
Total Protein: 9.1 g/dL — ABNORMAL HIGH (ref 6.5–8.1)

## 2020-06-23 LAB — CBC WITH DIFFERENTIAL/PLATELET
Abs Immature Granulocytes: 0.03 10*3/uL (ref 0.00–0.07)
Basophils Absolute: 0 10*3/uL (ref 0.0–0.1)
Basophils Relative: 0 %
Eosinophils Absolute: 0.1 10*3/uL (ref 0.0–0.5)
Eosinophils Relative: 2 %
HCT: 22.5 % — ABNORMAL LOW (ref 39.0–52.0)
Hemoglobin: 6.9 g/dL — CL (ref 13.0–17.0)
Immature Granulocytes: 1 %
Lymphocytes Relative: 17 %
Lymphs Abs: 0.9 10*3/uL (ref 0.7–4.0)
MCH: 30.9 pg (ref 26.0–34.0)
MCHC: 30.7 g/dL (ref 30.0–36.0)
MCV: 100.9 fL — ABNORMAL HIGH (ref 80.0–100.0)
Monocytes Absolute: 0.3 10*3/uL (ref 0.1–1.0)
Monocytes Relative: 5 %
Neutro Abs: 3.8 10*3/uL (ref 1.7–7.7)
Neutrophils Relative %: 75 %
Platelets: 287 10*3/uL (ref 150–400)
RBC: 2.23 MIL/uL — ABNORMAL LOW (ref 4.22–5.81)
RDW: 14.5 % (ref 11.5–15.5)
WBC: 5.1 10*3/uL (ref 4.0–10.5)
nRBC: 0 % (ref 0.0–0.2)

## 2020-06-23 LAB — APTT: aPTT: 35 seconds (ref 24–36)

## 2020-06-23 LAB — PROTIME-INR
INR: 1.1 (ref 0.8–1.2)
Prothrombin Time: 14.5 seconds (ref 11.4–15.2)

## 2020-06-23 LAB — PREPARE RBC (CROSSMATCH)

## 2020-06-23 MED ORDER — PANTOPRAZOLE SODIUM 40 MG IV SOLR
40.0000 mg | Freq: Two times a day (BID) | INTRAVENOUS | Status: DC
  Administered 2020-06-24: 40 mg via INTRAVENOUS
  Filled 2020-06-23 (×2): qty 40

## 2020-06-23 MED ORDER — ONDANSETRON HCL 4 MG PO TABS: 4.0000 mg | ORAL_TABLET | Freq: Four times a day (QID) | ORAL | Status: DC | PRN

## 2020-06-23 MED ORDER — ONDANSETRON HCL 4 MG/2ML IJ SOLN: 4.0000 mg | Freq: Four times a day (QID) | INTRAMUSCULAR | Status: DC | PRN

## 2020-06-23 MED ORDER — ACETAMINOPHEN 650 MG RE SUPP: 650.0000 mg | Freq: Four times a day (QID) | RECTAL | Status: DC | PRN

## 2020-06-23 MED ORDER — PANTOPRAZOLE SODIUM 40 MG IV SOLR
40.0000 mg | Freq: Once | INTRAVENOUS | Status: AC
  Administered 2020-06-23: 40 mg via INTRAVENOUS
  Filled 2020-06-23: qty 40

## 2020-06-23 MED ORDER — SODIUM CHLORIDE 0.9 % IV SOLN: 10.0000 mL/h | Freq: Once | INTRAVENOUS | Status: DC

## 2020-06-23 MED ORDER — ACETAMINOPHEN 325 MG PO TABS
650.0000 mg | ORAL_TABLET | Freq: Four times a day (QID) | ORAL | Status: DC | PRN
  Administered 2020-06-24: 650 mg via ORAL
  Filled 2020-06-23: qty 2

## 2020-06-23 NOTE — H&P (Signed)
History and Physical    Cory Weber HAL:[id number] DOB: 1950-08-18 DOA: 06/23/2020  PCP: Iona Beard, MD   Patient coming from: SNF [facility]).  I have personally briefly reviewed patient's old medical records in [place]  Chief Complaint: Low hemoglobin.  HPI: Cory Weber is a 70 y.o. male with medical history significant of chronic blood loss anemia, asthma, COPD, BPH, CAD, chronic diastolic heart failure, history of other non-hemorrhagic CVA, hypertension, GERD, hep C, history of pneumonia, hyperlipidemia, osteomyelitis, PAD with bilateral BKA, type II DM with diabetic peripheral neuropathy who was sent by his skilled nursing facility to the ED due to low hemoglobin level.  The patient states that he has been more somnolent and weaker than usual.  Denies abdominal pain, nausea, emesis, diarrhea, constipation, melena or hematochezia.  No dysuria, frequency or hematuria.  He denies fever, chills, sore throat, rhinorrhea, dyspnea, chest pain, palpitations, diaphoresis, PND, orthopnea or recent pitting edema of the lower extremities.  ED Course: Initial vital signs were temperature 98 F, pulse 72, rest durations 19, BP 150/63 mmHg O2 sat 100% on nasal cannula oxygen.  The patient received pantoprazole 40 mg IVP x1 dose.  Lab work: CBC showed WBC of 5.1, Moding 6.9 g/dL with an MCV of 100.9 fL and platelets 287.  Normal PT/INR/PTT.  CMP shows sodium 133, potassium 4.3, chloride 95 and CO2 35 mmol/L.  Glucose 242, BUN 16 and creatinine 1.41 mg/dL.  Total protein is 9.1 and albumin 2.3 g/dL.  The rest of the hepatic functions are within expected range.  Review of Systems: As per HPI otherwise all other systems reviewed and are negative.  Past Medical History:  Diagnosis Date  . Acute anemia 06/01/2020  . Anemia   . Asthma   . BPH (benign prostatic hyperplasia)   . CAD (coronary artery disease)    Severe multivessel disease 03/2020 - poor revascularization options  . Chronic  diastolic heart failure ([place])   . CKD (chronic kidney disease) stage 3, GFR 30-59 ml/min (HCC)   . COPD (chronic obstructive pulmonary disease) ([place])   . CVA (cerebral vascular accident) ([place]) 2015  . Essential hypertension   . Gangrene ([place]) 06/10/2016  . GERD (gastroesophageal reflux disease)   . Hepatitis C   . History of pneumonia   . Hyperlipidemia   . Osteomyelitis ([place])   . PAD (peripheral artery disease) (HCC)    Bilateral BKA  . Peripheral neuropathy   . Type II diabetes mellitus ([place])    Past Surgical History:  Procedure Laterality Date  . ABDOMINAL AORTOGRAM N/A 06/03/2016   Procedure: Abdominal Aortogram;  Surgeon: Waynetta Sandy, MD;  Location: [place] CV LAB;  Service: Cardiovascular;  Laterality: N/A;  . AMPUTATION Left 08/16/2015   Procedure: LEFT BELOW THE KNEE AMPUTATION ;  Surgeon: Serafina Mitchell, MD;  Location: [place];  Service: Vascular;  Laterality: Left;  . AMPUTATION Right 08/22/2016   Procedure: AMPUTATION BELOW KNEE-RIGHT;  Surgeon: Waynetta Sandy, MD;  Location: [place];  Service: Vascular;  Laterality: Right;  . BACK SURGERY    . BELOW KNEE LEG AMPUTATION Left 08/16/2015  . BIOPSY N/A 09/03/2012   Procedure: BIOPSY;  Surgeon: Daneil Dolin, MD;  [facility];  Service: Endoscopy;  Laterality: N/A;  gastric and gastric mucosa  . BIOPSY N/A 12/03/2012   Procedure: BIOPSY;  Surgeon: Daneil Dolin, MD;  [facility];  Service: Endoscopy;  Laterality: N/A;  . BIOPSY  03/31/2020   Procedure: BIOPSY;  Surgeon: Ladene Artist, MD;  Location: [facility] ENDOSCOPY;  Service: Endoscopy;;  . COLONOSCOPY WITH PROPOFOL N/A 09/03/2012   ZDG:LOVFIEP polyp-removed as outlined above. Prominent internal hemorrhoids. Tubular adenoma  . COLONOSCOPY WITH PROPOFOL N/A 04/05/2020   Procedure: COLONOSCOPY WITH PROPOFOL;  Surgeon: Lavena Bullion, DO;  Location: [place];  Service: Gastroenterology;  Laterality: N/A;  . ENTEROSCOPY Left 04/05/2020    Procedure: ENTEROSCOPY;  Surgeon: Lavena Bullion, DO;  Location: [place];  Service: Gastroenterology;  Laterality: Left;  . ENTEROSCOPY N/A 04/07/2020   Procedure: ENTEROSCOPY;  Surgeon: Lavena Bullion, DO;  Location: [place];  Service: Gastroenterology;  Laterality: N/A;  . ESOPHAGOGASTRODUODENOSCOPY (EGD) WITH PROPOFOL N/A 09/03/2012   PIR:JJOACZ hernia. Gastric diverticulum. Gastric ulcers with associated erosions. Duodenal erosions. Status post gastric biopsy. H.PYLORI gastritis   . ESOPHAGOGASTRODUODENOSCOPY (EGD) WITH PROPOFOL N/A 12/03/2012   Dr. Gala Romney: gastric diverticulum, gastric erosions and scar. Previously noted gastric ulcer completed healed. Biopsy without H.pylori.   . ESOPHAGOGASTRODUODENOSCOPY (EGD) WITH PROPOFOL N/A 01/23/2016   Procedure: ESOPHAGOGASTRODUODENOSCOPY (EGD) WITH PROPOFOL;  Surgeon: Doran Stabler, MD;  Location: [facility];  Service: Gastroenterology;  Laterality: N/A;  . ESOPHAGOGASTRODUODENOSCOPY (EGD) WITH PROPOFOL N/A 03/31/2020   Procedure: ESOPHAGOGASTRODUODENOSCOPY (EGD) WITH PROPOFOL;  Surgeon: Ladene Artist, MD;  Location: [facility] ENDOSCOPY;  Service: Endoscopy;  Laterality: N/A;  . ESOPHAGOGASTRODUODENOSCOPY (EGD) WITH PROPOFOL N/A 06/02/2020   Procedure: ESOPHAGOGASTRODUODENOSCOPY (EGD) WITH PROPOFOL;  Surgeon: Eloise Harman, DO;  [facility];  Service: Endoscopy;  Laterality: N/A;  . GIVENS CAPSULE STUDY N/A 04/05/2020   Procedure: GIVENS CAPSULE STUDY;  Surgeon: Lavena Bullion, DO;  Location: [place];  Service: Gastroenterology;  Laterality: N/A;  . HOT HEMOSTASIS N/A 04/07/2020   Procedure: HOT HEMOSTASIS (ARGON PLASMA COAGULATION/BICAP);  Surgeon: Lavena Bullion, DO;  Location: [facility] ENDOSCOPY;  Service: Gastroenterology;  Laterality: N/A;  . I & D EXTREMITY Right 07/11/2016   Procedure: DELAY PRIMARY CLOSURE FOOT AMPUTATION;  Surgeon: Edrick Kins, DPM;  Location: [place];  Service: Podiatry;  Laterality:  Right;  . LEFT HEART CATH AND CORONARY ANGIOGRAPHY N/A 03/21/2020   Procedure: LEFT HEART CATH AND CORONARY ANGIOGRAPHY;  Surgeon: Martinique, Peter M, MD;  Location: [place] CV LAB;  Service: Cardiovascular;  Laterality: N/A;  . LIVER BIOPSY  2005   Done in [place], [place]. Chronic hepatitis with mild periportal inflammation, lobular unicellular necrosis and portal fibrosis. Grade 2, stage 1-2.  . LOWER EXTREMITY ANGIOGRAPHY Right 06/03/2016   Procedure: Lower Extremity Angiography;  Surgeon: Waynetta Sandy, MD;  Location: [place] CV LAB;  Service: Cardiovascular;  Laterality: Right;  . MAXIMUM ACCESS (MAS)POSTERIOR LUMBAR INTERBODY FUSION (PLIF) 1 LEVEL N/A 11/25/2013   Procedure: FOR MAXIMUM ACCESS (MAS) POSTERIOR LUMBAR INTERBODY FUSION (PLIF) 1 LEVEL;  Surgeon: Eustace Moore, MD;  Location: [place] NEURO ORS;  Service: Neurosurgery;  Laterality: N/A;  FOR MAXIMUM ACCESS (MAS) POSTERIOR LUMBAR INTERBODY FUSION (PLIF) 1 LEVEL LUMBAR 3-4  . PERIPHERAL VASCULAR ATHERECTOMY Right 06/03/2016   Procedure: Peripheral Vascular Atherectomy;  Surgeon: Waynetta Sandy, MD;  Location: [place] CV LAB;  Service: Cardiovascular;  Laterality: Right;  SFA WITH STENT  . PERIPHERAL VASCULAR CATHETERIZATION Left 08/01/2015   Procedure: Lower Extremity Angiography;  Surgeon: Serafina Mitchell, MD;  Location: [place] CV LAB;  Service: Cardiovascular;  Laterality: Left;  . PERIPHERAL VASCULAR CATHETERIZATION N/A 08/01/2015   Procedure: Abdominal Aortogram;  Surgeon: Serafina Mitchell, MD;  Location: [place] CV LAB;  Service:  Cardiovascular;  Laterality: N/A;  . PERIPHERAL VASCULAR CATHETERIZATION N/A 08/08/2015   Procedure: Abdominal Aortogram w/Lower Extremity;  Surgeon: Serafina Mitchell, MD;  Location: [place] CV LAB;  Service: Cardiovascular;  Laterality: N/A;  . PERIPHERAL VASCULAR CATHETERIZATION Left 08/08/2015   Procedure: Peripheral Vascular Balloon Angioplasty;  Surgeon: Serafina Mitchell,  MD;  Location: [place] CV LAB;  Service: Cardiovascular;  Laterality: Left;  left popiteal artery, left peronealtrunk, left post tibial  . POLYPECTOMY N/A 09/03/2012   Procedure: POLYPECTOMY;  Surgeon: Daneil Dolin, MD;  [facility];  Service: Endoscopy;  Laterality: N/A;  cecal polyp  . SUBMUCOSAL TATTOO INJECTION  04/07/2020   Procedure: SUBMUCOSAL TATTOO INJECTION;  Surgeon: Lavena Bullion, DO;  Location: [facility]OS[facility];  Service: Gastroenterology;;  . TONSILLECTOMY    . TRANSMETATARSAL AMPUTATION Right 06/13/2016   Procedure: RIGHT TRANSMETATARSAL AMPUTATION;  Surgeon: Waynetta Sandy, MD;  Location: [place];  Service: Vascular;  Laterality: Right;  . TRANSMETATARSAL AMPUTATION Right 07/10/2016   Procedure: REVISION RIGHT TRANSMETATARSAL AMPUTATION;  Surgeon: Waynetta Sandy, MD;  Location: [place];  Service: Vascular;  Laterality: Right;   Social History  reports that he quit smoking about 4 years ago. His smoking use included cigarettes. He has a 47.00 pack-year smoking history. He has never used smokeless tobacco. He reports that he does not drink alcohol and does not use drugs.  Allergies  Allergen Reactions  . No Known Allergies    Family History  Problem Relation Age of Onset  . Breast cancer Mother   . Heart failure Mother   . Diabetes Father   . Hypertension Father   . Heart disease Father   . Hyperlipidemia Father   . Diabetes Sister   . Hypertension Sister   . Breast cancer Sister   . Colon cancer Neg Hx   . Liver disease Neg Hx    Prior to Admission medications   Medication Sig Start Date End Date Taking? Authorizing Provider  acetaminophen (TYLENOL) 325 MG tablet Take 2 tablets (650 mg total) by mouth every 6 (six) hours as needed for mild pain or headache (fever >/= 101). 02/24/19  Yes Roxan Hockey, MD  baclofen (LIORESAL) 10 MG tablet Take 1 tablet (10 mg total) by mouth every 8 (eight) hours as needed for muscle spasms. For shoulder  spasm 06/03/20  Yes Manuella Ghazi, Pratik D, DO  gabapentin (NEURONTIN) 300 MG capsule Take 300 mg by mouth 3 (three) times daily.   Yes [provider]  insulin lispro (HUMALOG) 100 UNIT/ML injection Inject 0.08 mLs (8 Units total) into the skin 3 (three) times daily before meals. Give only if eats 50% or more of meal. Patient taking differently: Inject 5 Units into the skin in the morning, at noon, and at bedtime. Give only if eats 50% or more of meal. 06/12/20  Yes Johnson, Clanford L, MD  ipratropium-albuterol (DUONEB) 0.5-2.5 (3) MG/3ML SOLN Take 3 mLs by nebulization every 6 (six) hours as needed (SOB/ wheezing).   Yes [provider]  aspirin EC 81 MG tablet Take 1 tablet (81 mg total) by mouth daily with breakfast. 02/24/19   Roxan Hockey, MD  carvedilol (COREG) 25 MG tablet Take 1 tablet (25 mg total) by mouth 2 (two) times daily with a meal. 04/08/20   Regalado, Belkys A, MD  clopidogrel (PLAVIX) 75 MG tablet Take 75 mg by mouth daily.    [provider]  cyanocobalamin 1000 MCG tablet Take 500 mcg by mouth daily.  [provider]  DULoxetine (CYMBALTA) 60 MG capsule Take 60 mg by mouth daily. Patient not taking: Reported on 06/13/2020    [provider]  ferrous gluconate (FERGON) 324 MG tablet Take 324 mg by mouth daily with breakfast.    [provider]  furosemide (LASIX) 20 MG tablet Take 80 mg by mouth daily. 06/09/20   [provider]  gabapentin (NEURONTIN) 100 MG capsule Take 200 mg by mouth 3 (three) times daily. 06/03/20   [provider]  hydrALAZINE (APRESOLINE) 50 MG tablet Take 1 tablet (50 mg total) by mouth every 8 (eight) hours. Patient taking differently: Take 50 mg by mouth daily. 04/08/20   Regalado, Belkys A, MD  insulin glargine (LANTUS) 100 UNIT/ML injection Inject 0.2 mLs (20 Units total) into the skin at bedtime. Patient taking differently: Inject 18 Units into the skin at bedtime. 06/12/20   Johnson,  Clanford L, MD  Insulin Glargine-yfgn 100 UNIT/ML SOPN Inject into the skin. Patient not taking: Reported on 06/13/2020 06/03/20   [provider]  isosorbide mononitrate (IMDUR) 120 MG 24 hr tablet Take 1 tablet (120 mg total) by mouth daily. 04/08/20   Regalado, Belkys A, MD  Lidocaine 4 % PTCH Apply 1 patch topically daily. Apply 1 patch to both stumps once daily, 12 hour use    [provider]  loratadine (CLARITIN) 10 MG tablet Take 10 mg by mouth daily.    [provider]  Melatonin 5 MG TABS Take 1 tablet by mouth at bedtime.    [provider]  NOVOLOG FLEXPEN 100 UNIT/ML FlexPen Inject into the skin. 06/14/20   [provider]  pantoprazole (PROTONIX) 40 MG tablet Take 40 mg by mouth daily.    [provider]  polyethylene glycol (MIRALAX / GLYCOLAX) 17 g packet Take 17 g by mouth daily.    [provider]  rosuvastatin (CRESTOR) 20 MG tablet Take 1 tablet (20 mg total) by mouth daily. Patient taking differently: Take 20 mg by mouth at bedtime. 04/08/20   Regalado, Belkys A, MD  tamsulosin (FLOMAX) 0.4 MG CAPS capsule Take 0.4 mg by mouth daily.    [provider]   Physical Exam: Vitals:   06/23/20 1812 06/23/20 1830 06/23/20 1900 06/23/20 2150  BP:   (!) 142/78 (!) 160/83  Pulse: 72 67  80  Resp: 19 17 18 19   Temp:    98.3 F (36.8 C)  TempSrc:    Oral  SpO2: 100% 99% 99% 100%  Weight:    116.9 kg  Height:    6\' 1"  (1.854 m)   Constitutional: Looks chronically ill, but in NAD, calm, comfortable Eyes: PERRL, lids and conjunctivae are pale.  Sclera mildly injected. ENMT: Mucous membranes are moist. Posterior pharynx clear of any exudate or lesions. Neck: normal, supple, no masses, no thyromegaly Respiratory: Decreased breath sounds in bases, otherwise clear to auscultation bilaterally, no wheezing, no crackles. Normal respiratory effort. No accessory muscle use.  Cardiovascular: Regular rate and rhythm, no  murmurs / rubs / gallops. No extremity edema. 2+ pedal pulses. No carotid bruits.  Abdomen: Obese, no distention.  Bowel sounds positive.  Soft, no tenderness, no masses palpated. No hepatosplenomegaly. Musculoskeletal: Bilateral BKA.  No clubbing / cyanosis.  Good ROM, no contractures. Normal muscle tone.  Skin: no acute rashes, lesions, ulcers on limited dermatological semination. Neurologic: CN 2-12 grossly intact. Sensation intact, DTR normal. Strength 5/5 in all 4.  Psychiatric: Normal judgment and insight. Alert and oriented  x 3. Normal mood.   Labs on Admission: I have personally reviewed following labs and imaging studies  CBC: Recent Labs  Lab 06/23/20 1555  WBC 5.1  NEUTROABS 3.8  HGB 6.9*  HCT 22.5*  MCV 100.9*  PLT 974    Basic Metabolic Panel: Recent Labs  Lab 06/23/20 1555  NA 133*  K 4.3  CL 95*  CO2 35*  GLUCOSE 242*  BUN 16  CREATININE 1.41*  CALCIUM 8.4*    GFR: Estimated Creatinine Clearance: 68.1 mL/min (A) (by C-G formula based on SCr of 1.41 mg/dL (H)).  Liver Function Tests: Recent Labs  Lab 06/23/20 1555  AST 14*  ALT 13  ALKPHOS 50  BILITOT 0.4  PROT 9.1*  ALBUMIN 2.3*   Radiological Exams on Admission: No results found.  EKG: Independently reviewed.   Assessment/Plan Principal Problem:   Symptomatic anemia in the setting of   AVM (arteriovenous malformation) of small bowel, acquired Observation/telemetry.  Continue PRBC transfusion per ED orders. Monitor hematocrit and hemoglobin. Had recent endoscopies, but will keep NPO. Consult gastroenterology for recommendations.  Active Problems:   Chronic kidney disease, stage 3 (HCC) Showed improved creatinine and GFR. Monitor renal function electrolytes.    HLD (hyperlipidemia) Continue rosuvastatin after med rec performed.    BPH (benign prostatic hyperplasia) Tamsulosin was held last week.    Type 2 diabetes with nephropathy (HCC) Continue duloxetine.    Chronic  diastolic CHF (congestive heart failure) (HCC) No signs of decompensation. Continue carvedilol, furosemide, hydralazine and Imdur.    COPD (chronic obstructive pulmonary disease) (HCC) Supplemental oxygen and bronchodilators as needed.   DVT prophylaxis: SCDs. Code Status:   Full code. Family Communication: Disposition Plan:   Patient is from:  SNF.  Anticipated DC to:  SNF.  Anticipated DC date:  06/24/2020 or 06/25/2020.  Anticipated DC barriers: Clinical status.  Consults called:  Routine gastroenterology consult. Admission status:  Observation/telemetry.  Severity of Illness: High severity due to symptomatic anemia in the setting of chronic blood loss.  The patient will remain in the hospital for PRBC transfusion and GI evaluation.  Reubin Milan MD [facility]  How to contact the [facility] Attending or Consulting provider [place] or covering provider during after hours [place], for this patient?   1. Check the care team in [facility] and look for a) attending/consulting [facility] provider listed and b) the [facility] team listed 2. Log into [email or web address] and use [place]'s universal password to access. If you do not have the password, please contact the hospital operator. 3. Locate the [facility] provider you are looking for under [facility] and page to a number that you can be directly reached. 4. If you still have difficulty reaching the provider, please page the [facility] (Director on Call) for the Hospitalists listed on amion for assistance.  06/23/2020, 11:29 PM   This document was prepared using Dragon voice recognition software and may contain some unintended transcription errors.

## 2020-06-23 NOTE — ED Provider Notes (Signed)
[facility] EMERGENCY DEPARTMENT Provider Note   CSN: [id number] Arrival date & time: 06/23/20  1520     History Chief Complaint  Patient presents with  . Abnormal Lab    Hgb 7.4    Cory Weber is a 70 y.o. male.  HPI     70 year old male w/ hx of gastritis, GI bleed requiring transfusion comes in w/ chief complaint of anemia. PMHx medical history significantfor COPD, CAD, peripheral vascular disease status post bilateral lower extremity amputations, type 2 diabetes mellitus, congestive heart, chronic supplemental oxygen dependence, long-term resident at [facility].  According to the patient, he was informed by the nursing facility that his hemoglobin was less than 8.  He is reporting that when he turns himself, he gets winded easily.  He is also reporting some shortness of breath when he exerts himself.  Patient is not on any blood thinners.   Past Medical History:  Diagnosis Date  . Acute anemia 06/01/2020  . Anemia   . Asthma   . BPH (benign prostatic hyperplasia)   . CAD (coronary artery disease)    Severe multivessel disease 03/2020 - poor revascularization options  . Chronic diastolic heart failure ([place])   . CKD (chronic kidney disease) stage 3, GFR 30-59 ml/min (HCC)   . COPD (chronic obstructive pulmonary disease) ([place])   . CVA (cerebral vascular accident) ([place]) 2015  . Essential hypertension   . Gangrene ([place]) 06/10/2016  . GERD (gastroesophageal reflux disease)   . Hepatitis C   . History of pneumonia   . Hyperlipidemia   . Osteomyelitis ([place])   . PAD (peripheral artery disease) (HCC)    Bilateral BKA  . Peripheral neuropathy   . Type II diabetes mellitus [facility] )     Patient Active Problem List   Diagnosis Date Noted  . Symptomatic anemia 06/23/2020  . COPD with acute exacerbation ([place]) 06/10/2020  . Acute respiratory failure with hypoxia and hypercarbia ([place]) 06/10/2020  . COPD (chronic obstructive pulmonary disease) ([place])   . Acute on chronic  anemia 06/01/2020  . Class 2 obesity due to excess calories with body mass index (BMI) of 35.0 to 35.9 in adult   . Acute on chronic combined systolic and diastolic congestive heart failure ([place]) 04/27/2020  . AVM (arteriovenous malformation) of small bowel, acquired   . Gastritis and gastroduodenitis   . Gastric diverticulum   . Grade I internal hemorrhoids   . Occult blood in stools   . Heart failure ([place]) 03/20/2020  . NSTEMI (non-ST elevated myocardial infarction) ([place]) 03/20/2020  . Pneumonia due to COVID-19 virus 02/24/2019  . Acute on chronic respiratory failure with hypoxia ([place]) 02/20/2019  . COVID-19 virus detected 02/20/2019  . DNR (do not resuscitate) 02/20/2019  . PAD (peripheral artery disease) ([place]) 08/22/2016  . Osteomyelitis of foot ([place]) 07/08/2016  . S/P transmetatarsal amputation of foot, right ([place]) 06/26/2016  . Constipation due to opioid therapy 06/15/2016  . Physical deconditioning 06/10/2016  . Chronic diastolic CHF (congestive heart failure) ([place]) 06/10/2016  . Ischemic pain of right foot 06/04/2016  . Ischemia of foot 05/31/2016  . 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 Type 2 diabetes with nephropathy ([place]) 01/21/2016  . Uncontrolled type II diabetes with peripheral autonomic neuropathy ([place]) 01/17/2016  . Edema of right lower extremity 12/20/2015  . Cough 11/26/2015  . GERD (gastroesophageal reflux disease) 10/08/2015  . Amputation of left lower extremity below knee ([place]) 08/21/2015  .  Phantom limb pain ([place])   . Anemia of chronic disease   . HLD (hyperlipidemia)   . BPH (benign prostatic hyperplasia)   . ETOH abuse   . PVD (peripheral vascular disease) ([facility])   . Chronic hepatitis C without hepatic coma ([place])   . History of CVA (cerebrovascular accident) without residual deficits   . Hyponatremia   . Chronic kidney disease, stage 3 ([place]) 07/18/2015  . Essential hypertension 07/18/2015  . Acute  blood loss anemia 12/01/2013  . S/P lumbar spinal fusion 11/25/2013  . Melena 12/03/2011  . Hemiparesthesia 02/03/2011  . Polysubstance abuse ([place]) 02/03/2011  . Tobacco abuse 02/03/2011    Past Surgical History:  Procedure Laterality Date  . ABDOMINAL AORTOGRAM N/A 06/03/2016   Procedure: Abdominal Aortogram;  Surgeon: Waynetta Sandy, MD;  Location: [place] [facility];  Service: Cardiovascular;  Laterality: N/A;  . AMPUTATION Left 08/16/2015   Procedure: LEFT BELOW THE KNEE AMPUTATION ;  Surgeon: Serafina Mitchell, MD;  Location: [place];  Service: Vascular;  Laterality: Left;  . AMPUTATION Right 08/22/2016   Procedure: AMPUTATION BELOW KNEE-RIGHT;  Surgeon: Waynetta Sandy, MD;  Location: [place];  Service: Vascular;  Laterality: Right;  . BACK SURGERY    . BELOW KNEE LEG AMPUTATION Left 08/16/2015  . BIOPSY N/A 09/03/2012   Procedure: BIOPSY;  Surgeon: Daneil Dolin, MD;  Location: [facility];  Service: Endoscopy;  Laterality: N/A;  gastric and gastric mucosa  . BIOPSY N/A 12/03/2012   Procedure: BIOPSY;  Surgeon: Daneil Dolin, MD;  [facility]: [facility];  Service: Endoscopy;  Laterality: N/A;  . BIOPSY  03/31/2020   Procedure: BIOPSY;  Surgeon: Ladene Artist, MD;  Location: [place];  Service: Endoscopy;;  . COLONOSCOPY WITH PROPOFOL N/A 09/03/2012   [id number] polyp-removed as outlined above. Prominent internal hemorrhoids. Tubular adenoma  . COLONOSCOPY WITH PROPOFOL N/A 04/05/2020   Procedure: COLONOSCOPY WITH PROPOFOL;  Surgeon: Lavena Bullion, DO;  Location: [place];  Service: Gastroenterology;  Laterality: N/A;  . ENTEROSCOPY Left 04/05/2020   Procedure: ENTEROSCOPY;  Surgeon: Lavena Bullion, DO;  Location: [place];  Service: Gastroenterology;  Laterality: Left;  . ENTEROSCOPY N/A 04/07/2020   Procedure: ENTEROSCOPY;  Surgeon: Lavena Bullion, DO;  Location: [place];  Service: Gastroenterology;  Laterality: N/A;  . ESOPHAGOGASTRODUODENOSCOPY  (EGD) WITH PROPOFOL N/A 09/03/2012   OVF:IEPPIR hernia. Gastric diverticulum. Gastric ulcers with associated erosions. Duodenal erosions. Status post gastric biopsy. H.PYLORI gastritis   . ESOPHAGOGASTRODUODENOSCOPY (EGD) WITH PROPOFOL N/A 12/03/2012   Dr. Gala Romney: gastric diverticulum, gastric erosions and scar. Previously noted gastric ulcer completed healed. Biopsy without H.pylori.   . ESOPHAGOGASTRODUODENOSCOPY (EGD) WITH PROPOFOL N/A 01/23/2016   Procedure: ESOPHAGOGASTRODUODENOSCOPY (EGD) WITH PROPOFOL;  Surgeon: Doran Stabler, MD;  [facility]: [facility];  Service: Gastroenterology;  Laterality: N/A;  . ESOPHAGOGASTRODUODENOSCOPY (EGD) WITH PROPOFOL N/A 03/31/2020   Procedure: ESOPHAGOGASTRODUODENOSCOPY (EGD) WITH PROPOFOL;  Surgeon: Ladene Artist, MD;  Location: [facility];  Service: Endoscopy;  Laterality: N/A;  . ESOPHAGOGASTRODUODENOSCOPY (EGD) WITH PROPOFOL N/A 06/02/2020   Procedure: ESOPHAGOGASTRODUODENOSCOPY (EGD) WITH PROPOFOL;  Surgeon: Eloise Harman, DO;  [facility];  Service: Endoscopy;  Laterality: N/A;  . GIVENS CAPSULE STUDY N/A 04/05/2020   Procedure: GIVENS CAPSULE STUDY;  Surgeon: Lavena Bullion, DO;  Location: [place];  Service: Gastroenterology;  Laterality: N/A;  . HOT HEMOSTASIS N/A 04/07/2020   Procedure: HOT HEMOSTASIS (ARGON PLASMA COAGULATION/BICAP);  Surgeon: Lavena Bullion, DO;  Location: [place];  Service: Gastroenterology;  Laterality: N/A;  . I & D EXTREMITY Right 07/11/2016   Procedure: DELAY PRIMARY CLOSURE FOOT AMPUTATION;  Surgeon: Edrick Kins, DPM;  Location: [place];  Service: Podiatry;  Laterality: Right;  . LEFT HEART CATH AND CORONARY ANGIOGRAPHY N/A 03/21/2020   Procedure: LEFT HEART CATH AND CORONARY ANGIOGRAPHY;  Surgeon: Martinique, Peter M, MD;  Location: [place] [facility];  Service: Cardiovascular;  Laterality: N/A;  . LIVER BIOPSY  2005   Done in [place], [place]. Chronic hepatitis with mild periportal  inflammation, lobular unicellular necrosis and portal fibrosis. Grade 2, stage 1-2.  . LOWER EXTREMITY ANGIOGRAPHY Right 06/03/2016   Procedure: Lower Extremity Angiography;  Surgeon: Waynetta Sandy, MD;  Location: [place] [facility];  Service: Cardiovascular;  Laterality: Right;  . MAXIMUM ACCESS (MAS)POSTERIOR LUMBAR INTERBODY FUSION (PLIF) 1 LEVEL N/A 11/25/2013   Procedure: FOR MAXIMUM ACCESS (MAS) POSTERIOR LUMBAR INTERBODY FUSION (PLIF) 1 LEVEL;  Surgeon: Eustace Moore, MD;  Location: [place] [facility];  Service: Neurosurgery;  Laterality: N/A;  FOR MAXIMUM ACCESS (MAS) POSTERIOR LUMBAR INTERBODY FUSION (PLIF) 1 LEVEL LUMBAR 3-4  . PERIPHERAL VASCULAR ATHERECTOMY Right 06/03/2016   Procedure: Peripheral Vascular Atherectomy;  Surgeon: Waynetta Sandy, MD;  Location: [place] [facility];  Service: Cardiovascular;  Laterality: Right;  SFA WITH STENT  . PERIPHERAL VASCULAR CATHETERIZATION Left 08/01/2015   Procedure: Lower Extremity Angiography;  Surgeon: Serafina Mitchell, MD;  Location: [place] [facility];  Service: Cardiovascular;  Laterality: Left;  . PERIPHERAL VASCULAR CATHETERIZATION N/A 08/01/2015   Procedure: Abdominal Aortogram;  Surgeon: Serafina Mitchell, MD;  Location: [place] [facility];  Service: Cardiovascular;  Laterality: N/A;  . PERIPHERAL VASCULAR CATHETERIZATION N/A 08/08/2015   Procedure: Abdominal Aortogram w/Lower Extremity;  Surgeon: Serafina Mitchell, MD;  Location: [place] [facility];  Service: Cardiovascular;  Laterality: N/A;  . PERIPHERAL VASCULAR CATHETERIZATION Left 08/08/2015   Procedure: Peripheral Vascular Balloon Angioplasty;  Surgeon: Serafina Mitchell, MD;  Location: [place] [facility];  Service: Cardiovascular;  Laterality: Left;  left popiteal artery, left peronealtrunk, left post tibial  . POLYPECTOMY N/A 09/03/2012   Procedure: POLYPECTOMY;  Surgeon: Daneil Dolin, MD;  [facility];  Service: Endoscopy;  Laterality: N/A;  cecal polyp  . SUBMUCOSAL  TATTOO INJECTION  04/07/2020   Procedure: SUBMUCOSAL TATTOO INJECTION;  Surgeon: Lavena Bullion, DO;  [facility];  Service: Gastroenterology;;  . TONSILLECTOMY    . TRANSMETATARSAL AMPUTATION Right 06/13/2016   Procedure: RIGHT TRANSMETATARSAL AMPUTATION;  Surgeon: Waynetta Sandy, MD;  Location: [place];  Service: Vascular;  Laterality: Right;  . TRANSMETATARSAL AMPUTATION Right 07/10/2016   Procedure: REVISION RIGHT TRANSMETATARSAL AMPUTATION;  Surgeon: Waynetta Sandy, MD;  Location: [facility];  Service: Vascular;  Laterality: Right;       Family History  Problem Relation Age of Onset  . Breast cancer Mother   . Heart failure Mother   . Diabetes Father   . Hypertension Father   . Heart disease Father   . Hyperlipidemia Father   . Diabetes Sister   . Hypertension Sister   . Breast cancer Sister   . Colon cancer Neg Hx   . Liver disease Neg Hx     Social History   Tobacco Use  . Smoking status: Former Smoker    Packs/day: 1.00    Years: 47.00    Pack years: 47.00    Types: Cigarettes    Quit date: 07/13/2015  Years since quitting: 4.9  . Smokeless tobacco: Never Used  Vaping Use  . Vaping Use: Never used  Substance Use Topics  . Alcohol use: No    Alcohol/week: 0.0 standard drinks    Comment:  none 08/15/2015 "stopped drinking 3-4 years ago"  . Drug use: No    Comment: 08/15/2015 "last used cocaine 3-4 months ago"    Home Medications Prior to Admission medications   Medication Sig Start Date End Date Taking? Authorizing Provider  acetaminophen (TYLENOL) 325 MG tablet Take 2 tablets (650 mg total) by mouth every 6 (six) hours as needed for mild pain or headache (fever >/= 101). 02/24/19  Yes Roxan Hockey, MD  baclofen (LIORESAL) 10 MG tablet Take 1 tablet (10 mg total) by mouth every 8 (eight) hours as needed for muscle spasms. For shoulder spasm 06/03/20  Yes Manuella Ghazi, Pratik D, DO  gabapentin (NEURONTIN) 300 MG capsule Take 300 mg by mouth 3  (three) times daily.   Yes [provider]  insulin lispro (HUMALOG) 100 UNIT/ML injection Inject 0.08 mLs (8 Units total) into the skin 3 (three) times daily before meals. Give only if eats 50% or more of meal. Patient taking differently: Inject 5 Units into the skin in the morning, at noon, and at bedtime. Give only if eats 50% or more of meal. 06/12/20  Yes Johnson, Clanford L, MD  ipratropium-albuterol (DUONEB) 0.5-2.5 (3) MG/3ML SOLN Take 3 mLs by nebulization every 6 (six) hours as needed (SOB/ wheezing).   Yes [provider]  aspirin EC 81 MG tablet Take 1 tablet (81 mg total) by mouth daily with breakfast. 02/24/19   Roxan Hockey, MD  carvedilol (COREG) 25 MG tablet Take 1 tablet (25 mg total) by mouth 2 (two) times daily with a meal. 04/08/20   Regalado, Belkys A, MD  clopidogrel (PLAVIX) 75 MG tablet Take 75 mg by mouth daily.    [provider]  cyanocobalamin 1000 MCG tablet Take 500 mcg by mouth daily.     [provider]  DULoxetine (CYMBALTA) 60 MG capsule Take 60 mg by mouth daily. Patient not taking: Reported on 06/13/2020    [provider]  ferrous gluconate (FERGON) 324 MG tablet Take 324 mg by mouth daily with breakfast.    [provider]  furosemide (LASIX) 20 MG tablet Take 80 mg by mouth daily. 06/09/20   [provider]  gabapentin (NEURONTIN) 100 MG capsule Take 200 mg by mouth 3 (three) times daily. 06/03/20   [provider]  hydrALAZINE (APRESOLINE) 50 MG tablet Take 1 tablet (50 mg total) by mouth every 8 (eight) hours. Patient taking differently: Take 50 mg by mouth daily. 04/08/20   Regalado, Belkys A, MD  insulin glargine (LANTUS) 100 UNIT/ML injection Inject 0.2 mLs (20 Units total) into the skin at bedtime. Patient taking differently: Inject 18 Units into the skin at bedtime. 06/12/20   Johnson, Clanford L, MD  Insulin Glargine-yfgn 100 UNIT/ML SOPN Inject into the skin. Patient not taking:  Reported on 06/13/2020 06/03/20   [provider]  isosorbide mononitrate (IMDUR) 120 MG 24 hr tablet Take 1 tablet (120 mg total) by mouth daily. 04/08/20   Regalado, Belkys A, MD  Lidocaine 4 % PTCH Apply 1 patch topically daily. Apply 1 patch to both stumps once daily, 12 hour use    [provider]  loratadine (CLARITIN) 10 MG tablet Take 10 mg by mouth daily.    [provider]  Melatonin 5 MG  TABS Take 1 tablet by mouth at bedtime.    [provider]  NOVOLOG FLEXPEN 100 UNIT/ML FlexPen Inject into the skin. 06/14/20   [provider]  pantoprazole (PROTONIX) 40 MG tablet Take 40 mg by mouth daily.    [provider]  polyethylene glycol (MIRALAX / GLYCOLAX) 17 g packet Take 17 g by mouth daily.    [provider]  rosuvastatin (CRESTOR) 20 MG tablet Take 1 tablet (20 mg total) by mouth daily. Patient taking differently: Take 20 mg by mouth at bedtime. 04/08/20   Regalado, Belkys A, MD  tamsulosin (FLOMAX) 0.4 MG CAPS capsule Take 0.4 mg by mouth daily.    [provider]    Allergies    No known allergies  Review of Systems   Review of Systems  Constitutional: Positive for activity change.  Respiratory: Positive for shortness of breath.   Cardiovascular: Negative for chest pain.  Gastrointestinal: Negative for abdominal pain, nausea and vomiting.       Digital rectal exam reveals dark stool that is guaiac positive.  No clear melena  All other systems reviewed and are negative.   Physical Exam Updated Vital Signs BP (!) 142/78   Pulse 67   Temp 98 F (36.7 C)   Resp 18   Ht 6\' 1"  (1.854 m)   Wt 113.9 kg   SpO2 99%   BMI 33.12 kg/m   Physical Exam Vitals and nursing note reviewed.  Constitutional:      Appearance: He is well-developed.  HENT:     Head: Atraumatic.  Cardiovascular:     Rate and Rhythm: Normal rate.  Pulmonary:     Effort: Pulmonary effort is normal.  Abdominal:     Comments: Dark  stool, quite positive  Musculoskeletal:     Cervical back: Neck supple.  Skin:    General: Skin is warm.  Neurological:     Mental Status: He is alert and oriented to person, place, and time.     ED Results / Procedures / Treatments   Labs (all labs ordered are listed, but only abnormal results are displayed) Labs Reviewed  COMPREHENSIVE METABOLIC PANEL - Abnormal; Notable for the following components:      Result Value   Sodium 133 (*)    Chloride 95 (*)    CO2 35 (*)    Glucose, Bld 242 (*)    Creatinine, Ser 1.41 (*)    Calcium 8.4 (*)    Total Protein 9.1 (*)    Albumin 2.3 (*)    AST 14 (*)    GFR, Estimated 55 (*)    Anion gap 3 (*)    All other components within normal limits  CBC WITH DIFFERENTIAL/PLATELET - Abnormal; Notable for the following components:   RBC 2.23 (*)    Hemoglobin 6.9 (*)    HCT 22.5 (*)    MCV 100.9 (*)    All other components within normal limits  SARS CORONAVIRUS 2 (TAT 6-24 HRS)  PROTIME-INR  APTT  CBC  POC OCCULT BLOOD, ED  TYPE AND SCREEN  PREPARE RBC (CROSSMATCH)    EKG None  Radiology No results found.  Procedures .Critical Care Performed by: Varney Biles, MD Authorized by: Varney Biles, MD   Critical care provider statement:    Critical care time (minutes):  36   Critical care was necessary to treat or prevent imminent or life-threatening deterioration of the following conditions: Symptomatic anemia.   Critical care was time spent personally  by me on the following activities:  Discussions with consultants, evaluation of patient's response to treatment, examination of patient, ordering and performing treatments and interventions, ordering and review of laboratory studies, ordering and review of radiographic studies, pulse oximetry, re-evaluation of patient's condition, obtaining history from patient or surrogate and review of old charts     Medications Ordered in ED Medications  0.9 %  sodium chloride infusion  (has no administration in time range)  pantoprazole (PROTONIX) injection 40 mg (has no administration in time range)  acetaminophen (TYLENOL) tablet 650 mg (has no administration in time range)    Or  acetaminophen (TYLENOL) suppository 650 mg (has no administration in time range)  ondansetron (ZOFRAN) tablet 4 mg (has no administration in time range)    Or  ondansetron (ZOFRAN) injection 4 mg (has no administration in time range)  pantoprazole (PROTONIX) injection 40 mg (40 mg Intravenous Given 06/23/20 1635)    ED Course  I have reviewed the triage vital signs and the nursing notes.  Pertinent labs & imaging results that were available during my care of the patient were reviewed by me and considered in my medical decision making (see chart for details).    MDM Rules/Calculators/A&P                          70 year old male comes in a chief complaint of low hemoglobin.  He is symptomatic with his anemia.  He has known history of anemia in the past secondary to GI bleed history.  Endoscopy from February and April reviewed.  Patient given IV Protonix for now.  His hemoglobin is less than 7, will give him 2 units to get him over 8.  Guaiac positive stools, no melena.  Stable for admission at this time.  Final Clinical Impression(s) / ED Diagnoses Final diagnoses:  Symptomatic anemia  Acute lower GI bleeding    Rx / DC Orders ED Discharge Orders    None       Varney Biles, MD 06/23/20 2037

## 2020-06-23 NOTE — ED Notes (Signed)
Pt's [facility] RN called to get an update on pt status; informed her still waiting on blood to be ready but she is welcome to call back and check on pt status in a couple of hours;

## 2020-06-23 NOTE — ED Triage Notes (Signed)
Pt brought in by [facility] for complaints of low hgb; pt had labs drawn and labs showed 7.4 hemoglobin; pt is a pt from [facility] and is c/o right side abdominal pain; pt states the pain is worse with movement

## 2020-06-24 DIAGNOSIS — D649 Anemia, unspecified: Secondary | ICD-10-CM | POA: Diagnosis not present

## 2020-06-24 DIAGNOSIS — J9611 Chronic respiratory failure with hypoxia: Secondary | ICD-10-CM

## 2020-06-24 DIAGNOSIS — E1121 Type 2 diabetes mellitus with diabetic nephropathy: Secondary | ICD-10-CM

## 2020-06-24 DIAGNOSIS — I5032 Chronic diastolic (congestive) heart failure: Secondary | ICD-10-CM

## 2020-06-24 DIAGNOSIS — N1831 Chronic kidney disease, stage 3a: Secondary | ICD-10-CM

## 2020-06-24 DIAGNOSIS — N400 Enlarged prostate without lower urinary tract symptoms: Secondary | ICD-10-CM | POA: Diagnosis not present

## 2020-06-24 DIAGNOSIS — J449 Chronic obstructive pulmonary disease, unspecified: Secondary | ICD-10-CM

## 2020-06-24 LAB — HEMOGLOBIN AND HEMATOCRIT, BLOOD
HCT: 30.3 % — ABNORMAL LOW (ref 39.0–52.0)
Hemoglobin: 9.5 g/dL — ABNORMAL LOW (ref 13.0–17.0)

## 2020-06-24 LAB — CBC
HCT: 23 % — ABNORMAL LOW (ref 39.0–52.0)
Hemoglobin: 6.8 g/dL — CL (ref 13.0–17.0)
MCH: 29.8 pg (ref 26.0–34.0)
MCHC: 29.6 g/dL — ABNORMAL LOW (ref 30.0–36.0)
MCV: 100.9 fL — ABNORMAL HIGH (ref 80.0–100.0)
Platelets: 280 10*3/uL (ref 150–400)
RBC: 2.28 MIL/uL — ABNORMAL LOW (ref 4.22–5.81)
RDW: 14.3 % (ref 11.5–15.5)
WBC: 6.3 10*3/uL (ref 4.0–10.5)
nRBC: 0 % (ref 0.0–0.2)

## 2020-06-24 LAB — SARS CORONAVIRUS 2 (TAT 6-24 HRS): SARS Coronavirus 2: NEGATIVE

## 2020-06-24 LAB — GLUCOSE, CAPILLARY
Glucose-Capillary: 191 mg/dL — ABNORMAL HIGH (ref 70–99)
Glucose-Capillary: 178 mg/dL — ABNORMAL HIGH (ref 70–99)
Glucose-Capillary: 219 mg/dL — ABNORMAL HIGH (ref 70–99)

## 2020-06-24 MED ORDER — FUROSEMIDE 10 MG/ML IJ SOLN
20.0000 mg | Freq: Once | INTRAMUSCULAR | Status: AC
  Administered 2020-06-24: 20 mg via INTRAVENOUS
  Filled 2020-06-24: qty 2

## 2020-06-24 MED ORDER — PANTOPRAZOLE SODIUM 40 MG PO TBEC: 40.0000 mg | DELAYED_RELEASE_TABLET | Freq: Two times a day (BID) | ORAL | 1 refills | Status: AC

## 2020-06-24 MED ORDER — INSULIN LISPRO 100 UNIT/ML ~~LOC~~ SOLN: 5.0000 [IU] | Freq: Three times a day (TID) | SUBCUTANEOUS | Status: DC

## 2020-06-24 MED ORDER — INSULIN GLARGINE 100 UNIT/ML ~~LOC~~ SOLN: 18.0000 [IU] | Freq: Every day | SUBCUTANEOUS | Status: AC

## 2020-06-24 MED ORDER — HYDRALAZINE HCL 50 MG PO TABS: 50.0000 mg | ORAL_TABLET | Freq: Every day | ORAL | Status: DC

## 2020-06-24 MED ORDER — POLYSACCHARIDE IRON COMPLEX 150 MG PO CAPS: 150.0000 mg | ORAL_CAPSULE | Freq: Two times a day (BID) | ORAL | 3 refills | Status: AC

## 2020-06-24 NOTE — Discharge Summary (Signed)
Physician Discharge Summary  [name]:[id number] DOB: Jan 08, 1951 DOA: 06/23/2020  PCP: Cory Beard, MD  Admit date: 06/23/2020 Discharge date: 06/24/2020  Time spent: 30 minutes  Recommendations for Outpatient Follow-up:  1. Repeat CBC to follow hemoglobin trend and stability 2. Repeat this metabolic panel to evaluate lites renal function 3. Will recommend schedule outpatient IV iron infusions and Epogen.   Discharge Diagnoses:  Principal Problem:   Symptomatic anemia Active Problems:   Chronic kidney disease, stage 3 (HCC)   HLD (hyperlipidemia)   BPH (benign prostatic hyperplasia)   Type 2 diabetes with nephropathy (HCC)   Chronic diastolic CHF (congestive heart failure) (HCC)   AVM (arteriovenous malformation) of small bowel, acquired   COPD (chronic obstructive pulmonary disease) (HCC)   Chronic respiratory failure with hypoxia ([place])   Discharge Condition: Stable and improved.  Discharge back to skilled nursing facility for further care and rehabilitation.  CODE STATUS: DNR  Diet recommendation: Heart healthy/modified carbohydrate diet.  Filed Weights   06/23/20 1532 06/23/20 2150  Weight: 113.9 kg 116.9 kg    History of present illness:  As per H&P written by Dr. Olevia Bowens on 06/23/2020  Cory Weber is a 70 y.o. male with medical history significant of chronic blood loss anemia, asthma, COPD, BPH, CAD, chronic diastolic heart failure, history of other non-hemorrhagic CVA, hypertension, GERD, hep C, history of pneumonia, hyperlipidemia, osteomyelitis, PAD with bilateral BKA, type II DM with diabetic peripheral neuropathy who was sent by his skilled nursing facility to the ED due to low hemoglobin level.  The patient states that he has been more somnolent and weaker than usual.  Denies abdominal pain, nausea, emesis, diarrhea, constipation, melena or hematochezia.  No dysuria, frequency or hematuria.  He denies fever, chills, sore throat, rhinorrhea, dyspnea,  chest pain, palpitations, diaphoresis, PND, orthopnea or recent pitting edema of the lower extremities.  ED Course: Initial vital signs were temperature 98 F, pulse 72, rest durations 19, BP 150/63 mmHg O2 sat 100% on nasal cannula oxygen.  The patient received pantoprazole 40 mg IVP x1 dose.  Hospital Course:  1-symptomatic anemia in the setting of chronic GI bleed due to AVMs/anemia of chronic kidney disease. -Hemoglobin on presentation 6.8 -Patient experiencing shortness of breath, general fatigue and increased tiredness.  No chest pain. -S/p 2 unit PRBC transfusion; follow-up H&H with a hemoglobin of 9.5 -No overt bleeding appreciated -Patient discharge on lymphatics and will also recommend the use of IV iron and Epogen as an outpatient.  2-hyperlipidemia -Continue Crestor  3-type 2 diabetes with nephropathy -Resume home hypoglycemic regimen -Patient with chronic kidney disease stage IIIa at baseline.  4-chronic kidney disease a stage IIIa -Overall stable -Advised to maintain adequate hydration to minimize nephrotoxic agents -Repeat basic metabolic panel to follow renal function trend/stability.  5-chronic diastolic heart failure -Overall stable and compensated -Continue home medication regimen with the use of carvedilol, hydralazine, Imdur and furosemide -Continue to follow daily weights, adequate hydration and low-sodium diet.  6-chronic respiratory failure with hypoxia in the setting of COPD -Continue as needed bronchodilator management -Continue oxygen supplementation.  7-BPH -Absent symptoms currently -Continue Flomax.  Procedures:  GI service curbside (no frank overt GI bleeding at this time; patient with well-known history of AVMs and chronic GI bleed).  No need for endoscopic evaluation currently.  Consultations:  See below for x-ray reports.  Discharge Exam: Vitals:   06/24/20 1257 06/24/20 1409  BP: (!) 147/93 (!) 167/83  Pulse: 78 79  Resp: 18 20  Temp: 98.3 F (36.8 C) 97.9 F (36.6 C)  SpO2: 100% 100%    General: Afebrile, no chest pain, no nausea, no vomiting, reports breathing at baseline (chronic oxygen supplementation in place).  Feels ready to go back to his facility. Cardiovascular: S1 and S2, no rubs, no gallops, no JVD. Respiratory: Good air movement bilaterally; no crackles, no wheezing.  No using accessory muscles. Abdomen: Soft, nontender, positive bowel sounds Extremities: Bilateral BKA.  Discharge Instructions   Discharge Instructions    Diet - low sodium heart healthy   Complete by: As directed    Discharge instructions   Complete by: As directed    Maintain adequate hydration Take medications as prescribed Outpatient follow-up with PCP in 1 week Repeat CBC to follow hemoglobin trend/stability Patient will benefit of outpatient Epogen infusion and IV iron.     Allergies as of 06/24/2020      Reactions   No Known Allergies       Medication List    STOP taking these medications   ferrous gluconate 324 MG tablet Commonly known as: FERGON   Insulin Glargine-yfgn 100 UNIT/ML Sopn     TAKE these medications   acetaminophen 325 MG tablet Commonly known as: TYLENOL Take 2 tablets (650 mg total) by mouth every 6 (six) hours as needed for mild pain or headache (fever >/= 101).   aspirin EC 81 MG tablet Take 1 tablet (81 mg total) by mouth daily with breakfast.   baclofen 10 MG tablet Commonly known as: LIORESAL Take 1 tablet (10 mg total) by mouth every 8 (eight) hours as needed for muscle spasms. For shoulder spasm   carvedilol 25 MG tablet Commonly known as: COREG Take 1 tablet (25 mg total) by mouth 2 (two) times daily with a meal.   clopidogrel 75 MG tablet Commonly known as: PLAVIX Take 75 mg by mouth daily.   cyanocobalamin 1000 MCG tablet Take 500 mcg by mouth daily.   DULoxetine 60 MG capsule Commonly known as: CYMBALTA Take 60 mg by mouth daily.   furosemide 20 MG  tablet Commonly known as: LASIX Take 80 mg by mouth daily.   gabapentin 300 MG capsule Commonly known as: NEURONTIN Take 300 mg by mouth 3 (three) times daily.   gabapentin 100 MG capsule Commonly known as: NEURONTIN Take 200 mg by mouth 3 (three) times daily.   hydrALAZINE 50 MG tablet Commonly known as: APRESOLINE Take 1 tablet (50 mg total) by mouth daily.   insulin glargine 100 UNIT/ML injection Commonly known as: LANTUS Inject 0.18 mLs (18 Units total) into the skin at bedtime.   insulin lispro 100 UNIT/ML injection Commonly known as: HUMALOG Inject 0.05 mLs (5 Units total) into the skin in the morning, at noon, and at bedtime. Give only if eats 50% or more of meal.   ipratropium-albuterol 0.5-2.5 (3) MG/3ML Soln Commonly known as: DUONEB Take 3 mLs by nebulization every 6 (six) hours as needed (SOB/ wheezing).   iron polysaccharides 150 MG capsule Commonly known as: NIFEREX Take 1 capsule (150 mg total) by mouth 2 (two) times daily.   isosorbide mononitrate 120 MG 24 hr tablet Commonly known as: IMDUR Take 1 tablet (120 mg total) by mouth daily.   Lidocaine 4 % Ptch Apply 1 patch topically daily. Apply 1 patch to both stumps once daily, 12 hour use   loratadine 10 MG tablet Commonly known as: CLARITIN Take 10 mg by mouth daily.   melatonin 5 MG Tabs Take 1 tablet by  mouth at bedtime.   NovoLOG FlexPen 100 UNIT/ML FlexPen Generic drug: insulin aspart Inject into the skin.   pantoprazole 40 MG tablet Commonly known as: PROTONIX Take 1 tablet (40 mg total) by mouth 2 (two) times daily. What changed: when to take this   polyethylene glycol 17 g packet Commonly known as: MIRALAX / GLYCOLAX Take 17 g by mouth daily.   rosuvastatin 20 MG tablet Commonly known as: CRESTOR Take 1 tablet (20 mg total) by mouth daily. What changed: when to take this   tamsulosin 0.4 MG Caps capsule Commonly known as: FLOMAX Take 0.4 mg by mouth daily.      Allergies   Allergen Reactions  . No Known Allergies     Follow-up Information    Cory Beard, MD. Schedule an appointment as soon as possible for a visit in 1 week(s).   Specialty: Family Medicine Contact information: [address] [place] [address] [phone]        Satira Sark, MD .   Specialty: Cardiology Contact information: [place] [address] [phone]               The results of significant diagnostics from this hospitalization (including imaging, microbiology, ancillary and laboratory) are listed below for reference.    Significant Diagnostic Studies: DG Chest 2 View  Result Date: 06/13/2020 CLINICAL DATA:  COPD, hypoxia EXAM: CHEST - 2 VIEW COMPARISON:  06/10/2020 FINDINGS: Frontal and lateral views of the chest demonstrate an unremarkable cardiac silhouette. No acute airspace disease, effusion, or pneumothorax. There are no acute bony abnormalities. IMPRESSION: 1. No acute intrathoracic process. Electronically Signed   By: Randa Ngo M.D.   On: 06/13/2020 16:26   DG Chest Port 1 View  Result Date: 06/10/2020 CLINICAL DATA:  Chest pain and shortness of breath onset several days. EXAM: PORTABLE CHEST 1 VIEW COMPARISON:  Chest x-rays dated 04/28/2020, 04/26/2020 and 03/20/2020. FINDINGS: Borderline cardiomegaly. Lungs are clear. No pleural effusion or pneumothorax is seen. Osseous structures about the chest are unremarkable. IMPRESSION: 1. Borderline cardiomegaly. 2. No evidence of pneumonia or pulmonary edema. 3. Aortic atherosclerosis. Electronically Signed   By: Franki Cabot M.D.   On: 06/10/2020 13:06    Microbiology: Recent Results (from the past 240 hour(s))  SARS CORONAVIRUS 2 (TAT 6-24 HRS) Nasopharyngeal Nasopharyngeal Swab     Status: None   Collection Time: 06/23/20  4:12 PM   Specimen: Nasopharyngeal Swab  Result Value Ref Range Status   SARS Coronavirus 2 NEGATIVE NEGATIVE Final    Comment: (NOTE) SARS-CoV-2  target nucleic acids are NOT DETECTED.  The SARS-CoV-2 RNA is generally detectable in upper and lower respiratory specimens during the acute phase of infection. Negative results do not preclude SARS-CoV-2 infection, do not rule out co-infections with other pathogens, and should not be used as the sole basis for treatment or other patient management decisions. Negative results must be combined with clinical observations, patient history, and epidemiological information. The expected result is Negative.  Fact Sheet for Patients: SugarRoll.be  Fact Sheet for Healthcare Providers: https://www.woods-mathews.com/  This test is not yet approved or cleared by the [place] FDA and  has been authorized for detection and/or diagnosis of SARS-CoV-2 by FDA under an Emergency Use Authorization (EUA). This EUA will remain  in effect (meaning this test can be used) for the duration of the COVID-19 declaration under Se ction 564(b)(1) of the Act, 21 U.S.C. section 360bbb-3(b)(1), unless the authorization is terminated or  revoked sooner.  Performed at [facility], [place] [address], [place], [place] [address]      Labs: Basic Metabolic Panel: Recent Labs  Lab 06/23/20 1555  NA 133*  K 4.3  CL 95*  CO2 35*  GLUCOSE 242*  BUN 16  CREATININE 1.41*  CALCIUM 8.4*   Liver Function Tests: Recent Labs  Lab 06/23/20 1555  AST 14*  ALT 13  ALKPHOS 50  BILITOT 0.4  PROT 9.1*  ALBUMIN 2.3*   CBC: Recent Labs  Lab 06/23/20 1555 06/24/20 0430 06/24/20 1522  WBC 5.1 6.3  --   NEUTROABS 3.8  --   --   HGB 6.9* 6.8* 9.5*  HCT 22.5* 23.0* 30.3*  MCV 100.9* 100.9*  --   PLT 287 280  --    BNP (last 3 results) Recent Labs    03/20/20 0119 04/27/20 0009 06/10/20 1226  BNP 213.0* 352.0* 211.0*   CBG: Recent Labs  Lab 06/24/20 0719 06/24/20 1106 06/24/20 1619  GLUCAP 191* 178* 219*    Signed:  Barton Dubois MD.  [facility] 06/24/2020, 5:12 PM

## 2020-06-24 NOTE — Plan of Care (Signed)
  Problem: Education: Goal: Knowledge of General Education information will improve Description Including pain rating scale, medication(s)/side effects and non-pharmacologic comfort measures Outcome: Progressing   Problem: Health Behavior/Discharge Planning: Goal: Ability to manage health-related needs will improve Outcome: Progressing   

## 2020-06-24 NOTE — Progress Notes (Signed)
Date and time results received: 06/24/20 0620   Test: hgb  Critical Value: 6.8  Name of Provider Notified: Dr. Olevia Bowens

## 2020-06-24 NOTE — Progress Notes (Signed)
Patient's blood sugar was 473. I notified Dr. Dyann Kief and am awaiting orders.

## 2020-06-24 NOTE — Progress Notes (Signed)
EMS arrived to transport patient to [place]. IV and telemetry removed. Legal Guardian Snead notified.

## 2020-06-24 NOTE — Progress Notes (Signed)
Patient to be discharged to [facility]. Discharge summary reviewed with patient and legal guardian Nakia via phone. Belongings gathered and at bedside. EMS notified, no estimated time of arrival at this time. Discharge paperwork prepared for EMS. Spoke with Roselyn Reef at [facility] to give report.

## 2020-06-25 LAB — BPAM RBC
Blood Product Expiration Date: 202206112359
Blood Product Expiration Date: 202206132359
ISSUE DATE / TIME: 202205140645
ISSUE DATE / TIME: 202205140932
Unit Type and Rh: 5100
Unit Type and Rh: 5100

## 2020-06-25 LAB — TYPE AND SCREEN
ABO/RH(D): O POS
Antibody Screen: NEGATIVE
Unit division: 0
Unit division: 0

## 2020-06-26 LAB — GLUCOSE, CAPILLARY: Glucose-Capillary: 203 mg/dL — ABNORMAL HIGH (ref 70–99)

## 2020-07-03 ENCOUNTER — Other Ambulatory Visit: Payer: Self-pay

## 2020-07-03 ENCOUNTER — Encounter (HOSPITAL_COMMUNITY): Payer: Self-pay

## 2020-07-03 ENCOUNTER — Emergency Department (HOSPITAL_COMMUNITY): Payer: Medicare (Managed Care)

## 2020-07-03 ENCOUNTER — Emergency Department (HOSPITAL_COMMUNITY)
Admission: EM | Admit: 2020-07-03 | Discharge: 2020-07-03 | Disposition: A | Discharge status: Home / Self Care | Payer: Medicare (Managed Care) | Attending: Emergency Medicine | Admitting: Emergency Medicine

## 2020-07-03 DIAGNOSIS — I5043 Acute on chronic combined systolic (congestive) and diastolic (congestive) heart failure: Secondary | ICD-10-CM | POA: Diagnosis not present

## 2020-07-03 DIAGNOSIS — M545 Low back pain, unspecified: Secondary | ICD-10-CM

## 2020-07-03 DIAGNOSIS — N183 Chronic kidney disease, stage 3 unspecified: Secondary | ICD-10-CM | POA: Diagnosis not present

## 2020-07-03 DIAGNOSIS — J449 Chronic obstructive pulmonary disease, unspecified: Secondary | ICD-10-CM | POA: Insufficient documentation

## 2020-07-03 DIAGNOSIS — Z9861 Coronary angioplasty status: Secondary | ICD-10-CM | POA: Insufficient documentation

## 2020-07-03 DIAGNOSIS — G8929 Other chronic pain: Secondary | ICD-10-CM | POA: Insufficient documentation

## 2020-07-03 DIAGNOSIS — J45909 Unspecified asthma, uncomplicated: Secondary | ICD-10-CM | POA: Diagnosis not present

## 2020-07-03 DIAGNOSIS — Z8616 Personal history of COVID-19: Secondary | ICD-10-CM | POA: Insufficient documentation

## 2020-07-03 DIAGNOSIS — Z87891 Personal history of nicotine dependence: Secondary | ICD-10-CM | POA: Diagnosis not present

## 2020-07-03 DIAGNOSIS — E1122 Type 2 diabetes mellitus with diabetic chronic kidney disease: Secondary | ICD-10-CM | POA: Insufficient documentation

## 2020-07-03 DIAGNOSIS — Z7982 Long term (current) use of aspirin: Secondary | ICD-10-CM | POA: Insufficient documentation

## 2020-07-03 DIAGNOSIS — E1140 Type 2 diabetes mellitus with diabetic neuropathy, unspecified: Secondary | ICD-10-CM | POA: Diagnosis not present

## 2020-07-03 DIAGNOSIS — R0789 Other chest pain: Secondary | ICD-10-CM

## 2020-07-03 DIAGNOSIS — I130 Hypertensive heart and chronic kidney disease with heart failure and stage 1 through stage 4 chronic kidney disease, or unspecified chronic kidney disease: Secondary | ICD-10-CM | POA: Diagnosis not present

## 2020-07-03 DIAGNOSIS — R059 Cough, unspecified: Secondary | ICD-10-CM | POA: Insufficient documentation

## 2020-07-03 DIAGNOSIS — I2510 Atherosclerotic heart disease of native coronary artery without angina pectoris: Secondary | ICD-10-CM | POA: Insufficient documentation

## 2020-07-03 DIAGNOSIS — Z794 Long term (current) use of insulin: Secondary | ICD-10-CM | POA: Diagnosis not present

## 2020-07-03 LAB — BASIC METABOLIC PANEL
Anion gap: 4 — ABNORMAL LOW (ref 5–15)
BUN: 11 mg/dL (ref 8–23)
CO2: 34 mmol/L — ABNORMAL HIGH (ref 22–32)
Calcium: 8.7 mg/dL — ABNORMAL LOW (ref 8.9–10.3)
Chloride: 94 mmol/L — ABNORMAL LOW (ref 98–111)
Creatinine, Ser: 1.26 mg/dL — ABNORMAL HIGH (ref 0.61–1.24)
GFR, Estimated: >60 mL/min (ref >60)
Glucose, Bld: 185 mg/dL — ABNORMAL HIGH (ref 70–99)
Potassium: 4.6 mmol/L (ref 3.5–5.1)
Sodium: 132 mmol/L — ABNORMAL LOW (ref 135–145)

## 2020-07-03 LAB — CBC WITH DIFFERENTIAL/PLATELET
Abs Immature Granulocytes: 0.01 10*3/uL (ref 0.00–0.07)
Basophils Absolute: 0 10*3/uL (ref 0.0–0.1)
Basophils Relative: 0 %
Eosinophils Absolute: 0.1 10*3/uL (ref 0.0–0.5)
Eosinophils Relative: 3 %
HCT: 29.4 % — ABNORMAL LOW (ref 39.0–52.0)
Hemoglobin: 9.2 g/dL — ABNORMAL LOW (ref 13.0–17.0)
Immature Granulocytes: 0 %
Lymphocytes Relative: 27 %
Lymphs Abs: 1 10*3/uL (ref 0.7–4.0)
MCH: 30.6 pg (ref 26.0–34.0)
MCHC: 31.3 g/dL (ref 30.0–36.0)
MCV: 97.7 fL (ref 80.0–100.0)
Monocytes Absolute: 0.2 10*3/uL (ref 0.1–1.0)
Monocytes Relative: 5 %
Neutro Abs: 2.3 10*3/uL (ref 1.7–7.7)
Neutrophils Relative %: 65 %
Platelets: 191 10*3/uL (ref 150–400)
RBC: 3.01 MIL/uL — ABNORMAL LOW (ref 4.22–5.81)
RDW: 13.9 % (ref 11.5–15.5)
WBC: 3.5 10*3/uL — ABNORMAL LOW (ref 4.0–10.5)
nRBC: 0 % (ref 0.0–0.2)

## 2020-07-03 LAB — TROPONIN I (HIGH SENSITIVITY)
Troponin I (High Sensitivity): 25 ng/L — ABNORMAL HIGH (ref <18)
Troponin I (High Sensitivity): 26 ng/L — ABNORMAL HIGH (ref <18)

## 2020-07-03 MED ORDER — HYDROCODONE-ACETAMINOPHEN 5-325 MG PO TABS
1.0000 | ORAL_TABLET | Freq: Once | ORAL | Status: AC
  Administered 2020-07-03: 1 via ORAL
  Filled 2020-07-03: qty 1

## 2020-07-03 NOTE — ED Notes (Signed)
ED Provider at bedside. 

## 2020-07-03 NOTE — ED Provider Notes (Signed)
Emergency Medicine Provider Triage Evaluation Note  Cory Weber , a 70 y.o. male  was evaluated in triage.  Pt complains of chest pain.  Review of Systems  Positive: Chest pain Negative: No fever  Physical Exam  BP (!) 182/93   Pulse 72   Temp 98.6 F (37 C)   Resp 19   Ht 1.854 m (6\' 1" )   Wt 116.9 kg   SpO2 96%   BMI 34.00 kg/m  Gen:   Awake, no distress  Chronically ill appearing Resp:  Normal effort     Medical Decision Making  Medically screening exam initiated at 6:25 AM.  Appropriate orders placed.  [name] was informed that the remainder of the evaluation will be completed by another provider, this initial triage assessment does not replace that evaluation, and the importance of remaining in the ED until their evaluation is complete.     Ripley Fraise, MD 07/03/20 [phone]

## 2020-07-03 NOTE — ED Provider Notes (Signed)
[facility] EMERGENCY DEPARTMENT Provider Note   CSN: [id number] Arrival date & time: 07/03/20  0541     History Chief Complaint  Patient presents with  . Chest Pain    PRESTIN MUNCH is a 70 y.o. male.  Patient from [place].  Patient is a DNR.  Patient with a complaint of chest pain with coughing.  Patient states has been going on for about 3 days.  Triage nurse note said that it happened just 15 minutes ago.  Patient has no chest pain if he is not coughing.  Patient also with a complaint of back pain in the lumbar area that is been ongoing for a long period of time.  Patient with multiple medical problems to include hypertension lipidemia asthma cerebrovascular accident type 2 diabetes chronic diastolic heart failure gastroesophageal reflux disease hepatitis C.  Peripheral artery disease.  Chronic kidney disease.  Coronary artery disease.  Patient status post bilateral below the knee amputations.  Patient is on oxygen at all times        Past Medical History:  Diagnosis Date  . Acute anemia 06/01/2020  . Anemia   . Asthma   . BPH (benign prostatic hyperplasia)   . CAD (coronary artery disease)    Severe multivessel disease 03/2020 - poor revascularization options  . Chronic diastolic heart failure ([place])   . CKD (chronic kidney disease) stage 3, GFR 30-59 ml/min (HCC)   . COPD (chronic obstructive pulmonary disease) ([place])   . CVA (cerebral vascular accident) ([place]) 2015  . Essential hypertension   . Gangrene ([place]) 06/10/2016  . GERD (gastroesophageal reflux disease)   . Hepatitis C   . History of pneumonia   . Hyperlipidemia   . Osteomyelitis ([place])   . PAD (peripheral artery disease) (HCC)    Bilateral BKA  . Peripheral neuropathy   . Type II diabetes mellitus [facility])     Patient Active Problem List   Diagnosis Date Noted  . Chronic respiratory failure with hypoxia ([place])   . Symptomatic anemia 06/23/2020  . COPD with acute exacerbation ([place])  06/10/2020  . Acute respiratory failure with hypoxia and hypercarbia ([place]) 06/10/2020  . COPD (chronic obstructive pulmonary disease) ([place])   . Acute on chronic anemia 06/01/2020  . Class 2 obesity due to excess calories with body mass index (BMI) of 35.0 to 35.9 in adult   . Acute on chronic combined systolic and diastolic congestive heart failure ([place]) 04/27/2020  . AVM (arteriovenous malformation) of small bowel, acquired   . Gastritis and gastroduodenitis   . Gastric diverticulum   . Grade I internal hemorrhoids   . Occult blood in stools   . Heart failure ([place]) 03/20/2020  . NSTEMI (non-ST elevated myocardial infarction) ([place]) 03/20/2020  . Pneumonia due to COVID-19 virus 02/24/2019  . Acute on chronic respiratory failure with hypoxia ([place]) 02/20/2019  . COVID-19 virus detected 02/20/2019  . DNR (do not resuscitate) 02/20/2019  . PAD (peripheral artery disease) ([place]) 08/22/2016  . Osteomyelitis of foot ([place]) 07/08/2016  . S/P transmetatarsal amputation of foot, right ([place]) 06/26/2016  . Constipation due to opioid therapy 06/15/2016  . Physical deconditioning 06/10/2016  . Chronic diastolic CHF (congestive heart failure) ([place]) 06/10/2016  . Ischemic pain of right foot 06/04/2016  . Ischemia of foot 05/31/2016  . 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 Type 2 diabetes with nephropathy ([place]) 01/21/2016  . Uncontrolled type II diabetes with peripheral autonomic  neuropathy ([place]) 01/17/2016  . Edema of right lower extremity 12/20/2015  . Cough 11/26/2015  . GERD (gastroesophageal reflux disease) 10/08/2015  . Amputation of left lower extremity below knee ([place]) 08/21/2015  . Phantom limb pain ([place])   . Anemia of chronic disease   . HLD (hyperlipidemia)   . BPH (benign prostatic hyperplasia)   . ETOH abuse   . PVD (peripheral vascular disease) ([place])   . Chronic hepatitis C without hepatic coma ([place])   . History of CVA  (cerebrovascular accident) without residual deficits   . Hyponatremia   . Chronic kidney disease, stage 3 ([place]) 07/18/2015  . Essential hypertension 07/18/2015  . Acute blood loss anemia 12/01/2013  . S/P lumbar spinal fusion 11/25/2013  . Melena 12/03/2011  . Hemiparesthesia 02/03/2011  . Polysubstance abuse ([place]) 02/03/2011  . Tobacco abuse 02/03/2011    Past Surgical History:  Procedure Laterality Date  . ABDOMINAL AORTOGRAM N/A 06/03/2016   Procedure: Abdominal Aortogram;  Surgeon: Waynetta Sandy, MD;  Location: [place] [facility];  Service: Cardiovascular;  Laterality: N/A;  . AMPUTATION Left 08/16/2015   Procedure: LEFT BELOW THE KNEE AMPUTATION ;  Surgeon: Serafina Mitchell, MD;  Location: [place];  Service: Vascular;  Laterality: Left;  . AMPUTATION Right 08/22/2016   Procedure: AMPUTATION BELOW KNEE-RIGHT;  Surgeon: Waynetta Sandy, MD;  Location: [place];  Service: Vascular;  Laterality: Right;  . BACK SURGERY    . BELOW KNEE LEG AMPUTATION Left 08/16/2015  . BIOPSY N/A 09/03/2012   Procedure: BIOPSY;  Surgeon: Daneil Dolin, MD;  Location: [facility];  Service: Endoscopy;  Laterality: N/A;  gastric and gastric mucosa  . BIOPSY N/A 12/03/2012   Procedure: BIOPSY;  Surgeon: Daneil Dolin, MD;  Location: [facility];  Service: Endoscopy;  Laterality: N/A;  . BIOPSY  03/31/2020   Procedure: BIOPSY;  Surgeon: Ladene Artist, MD;  Location: [place];  Service: Endoscopy;;  . COLONOSCOPY WITH PROPOFOL N/A 09/03/2012   IRJ:JOACZYS polyp-removed as outlined above. Prominent internal hemorrhoids. Tubular adenoma  . COLONOSCOPY WITH PROPOFOL N/A 04/05/2020   Procedure: COLONOSCOPY WITH PROPOFOL;  Surgeon: Lavena Bullion, DO;  Location: [place];  Service: Gastroenterology;  Laterality: N/A;  . ENTEROSCOPY Left 04/05/2020   Procedure: ENTEROSCOPY;  Surgeon: Lavena Bullion, DO;  Location: [place];  Service: Gastroenterology;  Laterality: Left;  . ENTEROSCOPY N/A  04/07/2020   Procedure: ENTEROSCOPY;  Surgeon: Lavena Bullion, DO;  Location: [place];  Service: Gastroenterology;  Laterality: N/A;  . ESOPHAGOGASTRODUODENOSCOPY (EGD) WITH PROPOFOL N/A 09/03/2012   AYT:KZSWFU hernia. Gastric diverticulum. Gastric ulcers with associated erosions. Duodenal erosions. Status post gastric biopsy. H.PYLORI gastritis   . ESOPHAGOGASTRODUODENOSCOPY (EGD) WITH PROPOFOL N/A 12/03/2012   Dr. Gala Romney: gastric diverticulum, gastric erosions and scar. Previously noted gastric ulcer completed healed. Biopsy without H.pylori.   . ESOPHAGOGASTRODUODENOSCOPY (EGD) WITH PROPOFOL N/A 01/23/2016   Procedure: ESOPHAGOGASTRODUODENOSCOPY (EGD) WITH PROPOFOL;  Surgeon: Doran Stabler, MD;  Location: [facility];  Service: Gastroenterology;  Laterality: N/A;  . ESOPHAGOGASTRODUODENOSCOPY (EGD) WITH PROPOFOL N/A 03/31/2020   Procedure: ESOPHAGOGASTRODUODENOSCOPY (EGD) WITH PROPOFOL;  Surgeon: Ladene Artist, MD;  Location: [facility] ENDOSCOPY;  Service: Endoscopy;  Laterality: N/A;  . ESOPHAGOGASTRODUODENOSCOPY (EGD) WITH PROPOFOL N/A 06/02/2020   Procedure: ESOPHAGOGASTRODUODENOSCOPY (EGD) WITH PROPOFOL;  Surgeon: Eloise Harman, DO;  [facility];  Service: Endoscopy;  Laterality: N/A;  . GIVENS CAPSULE STUDY N/A 04/05/2020   Procedure: GIVENS CAPSULE STUDY;  Surgeon: Lavena Bullion,  DO;  Location: [place];  Service: Gastroenterology;  Laterality: N/A;  . HOT HEMOSTASIS N/A 04/07/2020   Procedure: HOT HEMOSTASIS (ARGON PLASMA COAGULATION/BICAP);  Surgeon: Lavena Bullion, DO;  [facility] END[facility]COPY;  Service: Gastroenterology;  Laterality: N/A;  . I & D EXTREMITY Right 07/11/2016   Procedure: DELAY PRIMARY CLOSURE FOOT AMPUTATION;  Surgeon: Edrick Kins, DPM;  Location: [place];  Service: Podiatry;  Laterality: Right;  . LEFT HEART CATH AND CORONARY ANGIOGRAPHY N/A 03/21/2020   Procedure: LEFT HEART CATH AND CORONARY ANGIOGRAPHY;  Surgeon: Martinique, Peter M, MD;   Location: [place] [facility];  Service: Cardiovascular;  Laterality: N/A;  . LIVER BIOPSY  2005   Done in [place]. Chronic hepatitis with mild periportal inflammation, lobular unicellular necrosis and portal fibrosis. Grade 2, stage 1-2.  . LOWER EXTREMITY ANGIOGRAPHY Right 06/03/2016   Procedure: Lower Extremity Angiography;  Surgeon: Waynetta Sandy, MD;  Location: [place] [facility];  Service: Cardiovascular;  Laterality: Right;  . MAXIMUM ACCESS (MAS)POSTERIOR LUMBAR INTERBODY FUSION (PLIF) 1 LEVEL N/A 11/25/2013   Procedure: FOR MAXIMUM ACCESS (MAS) POSTERIOR LUMBAR INTERBODY FUSION (PLIF) 1 LEVEL;  Surgeon: Eustace Moore, MD;  Location: [place] [facility];  Service: Neurosurgery;  Laterality: N/A;  FOR MAXIMUM ACCESS (MAS) POSTERIOR LUMBAR INTERBODY FUSION (PLIF) 1 LEVEL LUMBAR 3-4  . PERIPHERAL VASCULAR ATHERECTOMY Right 06/03/2016   Procedure: Peripheral Vascular Atherectomy;  Surgeon: Waynetta Sandy, MD;  Location: [place] [facility];  Service: Cardiovascular;  Laterality: Right;  SFA WITH STENT  . PERIPHERAL VASCULAR CATHETERIZATION Left 08/01/2015   Procedure: Lower Extremity Angiography;  Surgeon: Serafina Mitchell, MD;  Location: [place] [facility];  Service: Cardiovascular;  Laterality: Left;  . PERIPHERAL VASCULAR CATHETERIZATION N/A 08/01/2015   Procedure: Abdominal Aortogram;  Surgeon: Serafina Mitchell, MD;  Location: [place] [facility];  Service: Cardiovascular;  Laterality: N/A;  . PERIPHERAL VASCULAR CATHETERIZATION N/A 08/08/2015   Procedure: Abdominal Aortogram w/Lower Extremity;  Surgeon: Serafina Mitchell, MD;  Location: [place] [facility];  Service: Cardiovascular;  Laterality: N/A;  . PERIPHERAL VASCULAR CATHETERIZATION Left 08/08/2015   Procedure: Peripheral Vascular Balloon Angioplasty;  Surgeon: Serafina Mitchell, MD;  Location: [place] [facility];  Service: Cardiovascular;  Laterality: Left;  left popiteal artery, left peronealtrunk, left post tibial  .  POLYPECTOMY N/A 09/03/2012   Procedure: POLYPECTOMY;  Surgeon: Daneil Dolin, MD;  Location: [facility];  Service: Endoscopy;  Laterality: N/A;  cecal polyp  . SUBMUCOSAL TATTOO INJECTION  04/07/2020   Procedure: SUBMUCOSAL TATTOO INJECTION;  Surgeon: Lavena Bullion, DO;  Location: [facility];  Service: Gastroenterology;;  . TONSILLECTOMY    . TRANSMETATARSAL AMPUTATION Right 06/13/2016   Procedure: RIGHT TRANSMETATARSAL AMPUTATION;  Surgeon: Waynetta Sandy, MD;  Location: [place];  Service: Vascular;  Laterality: Right;  . TRANSMETATARSAL AMPUTATION Right 07/10/2016   Procedure: REVISION RIGHT TRANSMETATARSAL AMPUTATION;  Surgeon: Waynetta Sandy, MD;  Location: [facility];  Service: Vascular;  Laterality: Right;       Family History  Problem Relation Age of Onset  . Breast cancer Mother   . Heart failure Mother   . Diabetes Father   . Hypertension Father   . Heart disease Father   . Hyperlipidemia Father   . Diabetes Sister   . Hypertension Sister   . Breast cancer Sister   . Colon cancer Neg Hx   . Liver disease Neg Hx     Social History   Tobacco Use  .  Smoking status: Former Smoker    Packs/day: 1.00    Years: 47.00    Pack years: 47.00    Types: Cigarettes    Quit date: 07/13/2015    Years since quitting: 4.9  . Smokeless tobacco: Never Used  Vaping Use  . Vaping Use: Never used  Substance Use Topics  . Alcohol use: No    Alcohol/week: 0.0 standard drinks    Comment:  none 08/15/2015 "stopped drinking 3-4 years ago"  . Drug use: No    Comment: 08/15/2015 "last used cocaine 3-4 months ago"    Home Medications Prior to Admission medications   Medication Sig Start Date End Date Taking? Authorizing Provider  acetaminophen (TYLENOL) 325 MG tablet Take 2 tablets (650 mg total) by mouth every 6 (six) hours as needed for mild pain or headache (fever >/= 101). 02/24/19  Yes Roxan Hockey, MD  aspirin EC 81 MG tablet Take 1 tablet (81 mg total) by mouth daily  with breakfast. 02/24/19  Yes Emokpae, Courage, MD  baclofen (LIORESAL) 10 MG tablet Take 1 tablet (10 mg total) by mouth every 8 (eight) hours as needed for muscle spasms. For shoulder spasm 06/03/20  Yes Manuella Ghazi, Pratik D, DO  carvedilol (COREG) 25 MG tablet Take 1 tablet (25 mg total) by mouth 2 (two) times daily with a meal. 04/08/20  Yes Regalado, Belkys A, MD  cefTRIAXone (ROCEPHIN) 1 g injection Inject 1 g into the muscle daily. For 5 days last dose yesterday 06/28/20  Yes [provider]  cloNIDine (CATAPRES) 0.1 MG tablet Take 0.1 mg by mouth at bedtime.   Yes [provider]  DULoxetine (CYMBALTA) 60 MG capsule Take 60 mg by mouth daily.   Yes [provider]  ferrous gluconate (FERGON) 324 MG tablet Take 324 mg by mouth daily with breakfast.   Yes [provider]  furosemide (LASIX) 20 MG tablet Take 80 mg by mouth daily. 06/09/20  Yes [provider]  gabapentin (NEURONTIN) 300 MG capsule Take 300 mg by mouth 3 (three) times daily.   Yes [provider]  hydrALAZINE (APRESOLINE) 50 MG tablet Take 1 tablet (50 mg total) by mouth daily. 06/24/20  Yes Barton Dubois, MD  HYDROcodone-acetaminophen (NORCO/VICODIN) 5-325 MG tablet Take 1 tablet by mouth 4 (four) times daily as needed. 06/26/20  Yes [provider]  insulin glargine (LANTUS) 100 UNIT/ML injection Inject 0.18 mLs (18 Units total) into the skin at bedtime. 06/24/20  Yes Barton Dubois, MD  insulin lispro (HUMALOG) 100 UNIT/ML injection Inject 0.05 mLs (5 Units total) into the skin in the morning, at noon, and at bedtime. Give only if eats 50% or more of meal. Patient taking differently: Inject 8 Units into the skin in the morning, at noon, and at bedtime. Give only if eats 50% or more of meal. 06/24/20  Yes Barton Dubois, MD  Insulin Lispro-aabc 200 UNIT/ML SOPN Inject 2-8 Units into the skin 3 (three) times daily with meals. And at bedtime. 200-250=2units 251-300=4units  301-350=6units 351-400=8units   Yes [provider]  ipratropium-albuterol (DUONEB) 0.5-2.5 (3) MG/3ML SOLN Take 3 mLs by nebulization every 6 (six) hours as needed (SOB/ wheezing).   Yes [provider]  isosorbide mononitrate (IMDUR) 120 MG 24 hr tablet Take 1 tablet (120 mg total) by mouth daily. 04/08/20  Yes Regalado, Belkys A, MD  Lidocaine 4 % PTCH Apply 1 patch topically daily. Apply 1 patch to both stumps once daily, 12 hour use   Yes [provider]  loratadine (CLARITIN) 10 MG tablet Take 10 mg by mouth daily.   Yes [provider]  Melatonin 5 MG TABS Take 1 tablet by mouth at bedtime.   Yes [provider]  pantoprazole (PROTONIX) 40 MG tablet Take 1 tablet (40 mg total) by mouth 2 (two) times daily. 06/24/20  Yes Barton Dubois, MD  polyethylene glycol (MIRALAX / GLYCOLAX) 17 g packet Take 17 g by mouth daily.   Yes [provider]  rosuvastatin (CRESTOR) 20 MG tablet Take 1 tablet (20 mg total) by mouth daily. Patient taking differently: Take 20 mg by mouth at bedtime. 04/08/20  Yes Regalado, Belkys A, MD  senna (SENOKOT) 8.6 MG tablet Take 1 tablet by mouth 2 (two) times daily.   Yes [provider]  tamsulosin (FLOMAX) 0.4 MG CAPS capsule Take 0.4 mg by mouth daily.   Yes [provider]  vitamin B-12 (CYANOCOBALAMIN) 500 MCG tablet Take 500 mcg by mouth daily.    Yes [provider]  clopidogrel (PLAVIX) 75 MG tablet Take 75 mg by mouth daily. Patient not taking: Reported on 07/03/2020    [provider]  iron polysaccharides (NIFEREX) 150 MG capsule Take 1 capsule (150 mg total) by mouth 2 (two) times daily. Patient not taking: Reported on 07/03/2020 06/24/20   Barton Dubois, MD    Allergies    No known allergies  Review of Systems   Review of Systems  Constitutional: Negative for chills and fever.  HENT: Negative for congestion, rhinorrhea and sore throat.   Eyes: Negative for visual  disturbance.  Respiratory: Positive for cough. Negative for shortness of breath.   Cardiovascular: Positive for chest pain. Negative for leg swelling.  Gastrointestinal: Negative for abdominal pain, diarrhea, nausea and vomiting.  Genitourinary: Negative for dysuria.  Musculoskeletal: Negative for back pain and neck pain.  Skin: Negative for rash.  Neurological: Negative for dizziness, light-headedness and headaches.  Hematological: Does not bruise/bleed easily.  Psychiatric/Behavioral: Negative for confusion.    Physical Exam Updated Vital Signs BP (!) 191/90 (BP Location: Left Arm)   Pulse 70   Temp 98.5 F (36.9 C) (Oral)   Resp 20   Ht 1.854 m (6\' 1" )   Wt 116.9 kg   SpO2 100%   BMI 34.00 kg/m   Physical Exam Vitals and nursing note reviewed.  Constitutional:      General: He is not in acute distress.    Appearance: Normal appearance. He is well-developed.  HENT:     Head: Normocephalic and atraumatic.  Eyes:     Extraocular Movements: Extraocular movements intact.     Conjunctiva/sclera: Conjunctivae normal.     Pupils: Pupils are equal, round, and reactive to light.  Cardiovascular:     Rate and Rhythm: Normal rate and regular rhythm.     Heart sounds: No murmur heard.   Pulmonary:     Effort: Pulmonary effort is normal. No respiratory distress.     Breath sounds: Normal breath sounds. No wheezing.  Abdominal:     Palpations: Abdomen is soft.     Tenderness: There is no abdominal tenderness.  Musculoskeletal:        General: Normal range of motion.     Cervical back: Normal range of motion and neck supple.     Comments: Bilateral below the knee amputation  Skin:    General: Skin is warm and dry.     Capillary Refill: Capillary refill takes less than 2 seconds.  Neurological:  General: No focal deficit present.     Mental Status: He is alert and oriented to person, place, and time.     ED Results / Procedures / Treatments   Labs (all labs ordered  are listed, but only abnormal results are displayed) Labs Reviewed  BASIC METABOLIC PANEL - Abnormal; Notable for the following components:      Result Value   Sodium 132 (*)    Chloride 94 (*)    CO2 34 (*)    Glucose, Bld 185 (*)    Creatinine, Ser 1.26 (*)    Calcium 8.7 (*)    Anion gap 4 (*)    All other components within normal limits  CBC WITH DIFFERENTIAL/PLATELET - Abnormal; Notable for the following components:   WBC 3.5 (*)    RBC 3.01 (*)    Hemoglobin 9.2 (*)    HCT 29.4 (*)    All other components within normal limits  TROPONIN I (HIGH SENSITIVITY) - Abnormal; Notable for the following components:   Troponin I (High Sensitivity) 25 (*)    All other components within normal limits  TROPONIN I (HIGH SENSITIVITY)    EKG EKG Interpretation  Date/Time:  Monday Jul 03 2020 05:47:15 EDT Ventricular Rate:  79 PR Interval:  167 QRS Duration: 88 QT Interval:  380 QTC Calculation: 436 R Axis:   80 Text Interpretation: Sinus rhythm Borderline T wave abnormalities Confirmed by Ripley Fraise [phone]) on 07/03/2020 5:50:52 AM   Radiology DG Chest Port 1 View  Result Date: 07/03/2020 CLINICAL DATA:  70 year old male with left side chest pain. EXAM: PORTABLE CHEST 1 VIEW COMPARISON:  Chest radiographs 06/13/2020 and earlier. FINDINGS: Portable AP upright view at 0656 hours. Stable lung volumes and mediastinal contours. Heart size at the upper limits of normal. Visualized tracheal air column is within normal limits. No pneumothorax or pleural effusion. Allowing for portable technique the lungs are clear. Chronic degenerative and/or posttraumatic osteophytosis about the right shoulder. No acute osseous abnormality identified. IMPRESSION: No acute cardiopulmonary abnormality. Electronically Signed   By: Genevie Ann M.D.   On: 07/03/2020 07:25    Procedures Procedures   Medications Ordered in ED Medications  HYDROcodone-acetaminophen (NORCO/VICODIN) 5-325 MG per tablet 1 tablet  (1 tablet Oral Given 07/03/20 1041)    ED Course  I have reviewed the triage vital signs and the nursing notes.  Pertinent labs & imaging results that were available during my care of the patient were reviewed by me and considered in my medical decision making (see chart for details).    MDM Rules/Calculators/A&P                          Patient's renal function is actually pretty good today.  Patient's troponin did go from 19-25 so a change of 6.  But his chest pain is very chest wall in nature.  Chest x-ray without any acute findings.  The change in the troponin based on the fact that has had this chest discomfort for 3 days.  Has not significant.  And is very chest wall in nature as mentioned.  Patient's oxygen sats situation stable on his normal amount of oxygen.  Patient stable for discharge back to [facility].  Patient's main concern really was the chronic back pain has been going on for weeks.  We will have him follow-up with orthopedics for that.  He is on pain medication for that at the nursing home.     Final  Clinical Impression(s) / ED Diagnoses Final diagnoses:  Chest wall pain  Chronic bilateral low back pain without sciatica    Rx / DC Orders ED Discharge Orders    None       Fredia Sorrow, MD 07/03/20 1302

## 2020-07-03 NOTE — ED Triage Notes (Addendum)
BIB [facility] c/o L sided chest pain starting 15 PTA.

## 2020-07-03 NOTE — Discharge Instructions (Addendum)
Work-up for the cough and the chest pain with the cough without any acute findings.  Make an appointment to follow-up with orthopedics regarding the chronic back pain.  Return for any new or worse symptoms.

## 2020-07-04 ENCOUNTER — Encounter (HOSPITAL_COMMUNITY): Payer: Self-pay | Admitting: *Deleted

## 2020-07-04 ENCOUNTER — Other Ambulatory Visit: Payer: Self-pay

## 2020-07-04 ENCOUNTER — Emergency Department (HOSPITAL_COMMUNITY)
Admission: EM | Admit: 2020-07-04 | Discharge: 2020-07-04 | Disposition: A | Discharge status: Home / Self Care | Payer: Medicare (Managed Care) | Attending: Emergency Medicine | Admitting: Emergency Medicine

## 2020-07-04 DIAGNOSIS — K5900 Constipation, unspecified: Secondary | ICD-10-CM | POA: Diagnosis not present

## 2020-07-04 DIAGNOSIS — Z79899 Other long term (current) drug therapy: Secondary | ICD-10-CM | POA: Diagnosis not present

## 2020-07-04 DIAGNOSIS — R1031 Right lower quadrant pain: Secondary | ICD-10-CM | POA: Insufficient documentation

## 2020-07-04 DIAGNOSIS — J441 Chronic obstructive pulmonary disease with (acute) exacerbation: Secondary | ICD-10-CM | POA: Diagnosis not present

## 2020-07-04 DIAGNOSIS — I5043 Acute on chronic combined systolic (congestive) and diastolic (congestive) heart failure: Secondary | ICD-10-CM | POA: Diagnosis not present

## 2020-07-04 DIAGNOSIS — I2510 Atherosclerotic heart disease of native coronary artery without angina pectoris: Secondary | ICD-10-CM | POA: Insufficient documentation

## 2020-07-04 DIAGNOSIS — N183 Chronic kidney disease, stage 3 unspecified: Secondary | ICD-10-CM | POA: Diagnosis not present

## 2020-07-04 DIAGNOSIS — E1122 Type 2 diabetes mellitus with diabetic chronic kidney disease: Secondary | ICD-10-CM | POA: Diagnosis not present

## 2020-07-04 DIAGNOSIS — Z7982 Long term (current) use of aspirin: Secondary | ICD-10-CM | POA: Diagnosis not present

## 2020-07-04 DIAGNOSIS — J45909 Unspecified asthma, uncomplicated: Secondary | ICD-10-CM | POA: Insufficient documentation

## 2020-07-04 DIAGNOSIS — I130 Hypertensive heart and chronic kidney disease with heart failure and stage 1 through stage 4 chronic kidney disease, or unspecified chronic kidney disease: Secondary | ICD-10-CM | POA: Insufficient documentation

## 2020-07-04 DIAGNOSIS — Z8616 Personal history of COVID-19: Secondary | ICD-10-CM | POA: Insufficient documentation

## 2020-07-04 DIAGNOSIS — Z87891 Personal history of nicotine dependence: Secondary | ICD-10-CM | POA: Insufficient documentation

## 2020-07-04 DIAGNOSIS — Z794 Long term (current) use of insulin: Secondary | ICD-10-CM | POA: Insufficient documentation

## 2020-07-04 LAB — CBC WITH DIFFERENTIAL/PLATELET
Abs Immature Granulocytes: 0.01 10*3/uL (ref 0.00–0.07)
Basophils Absolute: 0 10*3/uL (ref 0.0–0.1)
Basophils Relative: 0 %
Eosinophils Absolute: 0.1 10*3/uL (ref 0.0–0.5)
Eosinophils Relative: 4 %
HCT: 27.2 % — ABNORMAL LOW (ref 39.0–52.0)
Hemoglobin: 8.4 g/dL — ABNORMAL LOW (ref 13.0–17.0)
Immature Granulocytes: 0 %
Lymphocytes Relative: 33 %
Lymphs Abs: 1.2 10*3/uL (ref 0.7–4.0)
MCH: 30.4 pg (ref 26.0–34.0)
MCHC: 30.9 g/dL (ref 30.0–36.0)
MCV: 98.6 fL (ref 80.0–100.0)
Monocytes Absolute: 0.2 10*3/uL (ref 0.1–1.0)
Monocytes Relative: 7 %
Neutro Abs: 1.9 10*3/uL (ref 1.7–7.7)
Neutrophils Relative %: 56 %
Platelets: 205 10*3/uL (ref 150–400)
RBC: 2.76 MIL/uL — ABNORMAL LOW (ref 4.22–5.81)
RDW: 13.9 % (ref 11.5–15.5)
WBC: 3.5 10*3/uL — ABNORMAL LOW (ref 4.0–10.5)
nRBC: 0 % (ref 0.0–0.2)

## 2020-07-04 LAB — COMPREHENSIVE METABOLIC PANEL
ALT: 11 U/L (ref 0–44)
AST: 12 U/L — ABNORMAL LOW (ref 15–41)
Albumin: 2.5 g/dL — ABNORMAL LOW (ref 3.5–5.0)
Alkaline Phosphatase: 55 U/L (ref 38–126)
Anion gap: 3 — ABNORMAL LOW (ref 5–15)
BUN: 18 mg/dL (ref 8–23)
CO2: 34 mmol/L — ABNORMAL HIGH (ref 22–32)
Calcium: 8.6 mg/dL — ABNORMAL LOW (ref 8.9–10.3)
Chloride: 96 mmol/L — ABNORMAL LOW (ref 98–111)
Creatinine, Ser: 1.75 mg/dL — ABNORMAL HIGH (ref 0.61–1.24)
GFR, Estimated: 42 mL/min — ABNORMAL LOW (ref >60)
Glucose, Bld: 202 mg/dL — ABNORMAL HIGH (ref 70–99)
Potassium: 4.4 mmol/L (ref 3.5–5.1)
Sodium: 133 mmol/L — ABNORMAL LOW (ref 135–145)
Total Bilirubin: 0.3 mg/dL (ref 0.3–1.2)
Total Protein: 9.4 g/dL — ABNORMAL HIGH (ref 6.5–8.1)

## 2020-07-04 LAB — URINALYSIS, ROUTINE W REFLEX MICROSCOPIC
Bacteria, UA: NONE SEEN
Bilirubin Urine: NEGATIVE
Glucose, UA: NEGATIVE mg/dL
Hgb urine dipstick: NEGATIVE
Ketones, ur: NEGATIVE mg/dL
Leukocytes,Ua: NEGATIVE
Nitrite: NEGATIVE
Protein, ur: 30 mg/dL — AB
Specific Gravity, Urine: 1.016 (ref 1.005–1.030)
pH: 5 (ref 5.0–8.0)

## 2020-07-04 LAB — LIPASE, BLOOD: Lipase: 19 U/L (ref 11–51)

## 2020-07-04 MED ORDER — GLYCERIN (ADULT) 2 G RE SUPP: 1.0000 | RECTAL | 0 refills | Status: DC | PRN

## 2020-07-04 MED ORDER — POLYETHYLENE GLYCOL 3350 17 G PO PACK: 17.0000 g | PACK | Freq: Every day | ORAL | 0 refills | Status: AC

## 2020-07-04 NOTE — Discharge Instructions (Signed)
Manage constipation with glycerin suppositories and MiraLAX or per facility protocol.

## 2020-07-04 NOTE — ED Provider Notes (Signed)
[facility] EMERGENCY DEPARTMENT Provider Note   CSN: [id number] Arrival date & time: 07/04/20  1711     History Chief Complaint  Patient presents with  . Flank Pain    Cory Weber is a 70 y.o. male.  70 year old male with multiple medical problems including bilateral BKA, coronary artery disease, CHF, CKD, hyperlipidemia, diabetes, brought in by EMS from facility with complaint of ongoing right lower abdominal pain.  Patient states this has been an ongoing problem that he has been seen for multiple times in the emergency room without source found.  Last bowel movement is unknown, states he has had an enema in the ER in the past.  He denies nausea, vomiting, changes in bladder habits, fevers.  No prior abdominal surgeries.  No other complaints or concerns today.        Past Medical History:  Diagnosis Date  . Acute anemia 06/01/2020  . Anemia   . Asthma   . BPH (benign prostatic hyperplasia)   . CAD (coronary artery disease)    Severe multivessel disease 03/2020 - poor revascularization options  . Chronic diastolic heart failure ([place])   . CKD (chronic kidney disease) stage 3, GFR 30-59 ml/min (HCC)   . COPD (chronic obstructive pulmonary disease) ([place])   . CVA (cerebral vascular accident) ([place]) 2015  . Essential hypertension   . Gangrene ([place]) 06/10/2016  . GERD (gastroesophageal reflux disease)   . Hepatitis C   . History of pneumonia   . Hyperlipidemia   . Osteomyelitis ([place])   . PAD (peripheral artery disease) (HCC)    Bilateral BKA  . Peripheral neuropathy   . Type II diabetes mellitus [facility])     Patient Active Problem List   Diagnosis Date Noted  . Chronic respiratory failure with hypoxia ([place])   . Symptomatic anemia 06/23/2020  . COPD with acute exacerbation ([place]) 06/10/2020  . Acute respiratory failure with hypoxia and hypercarbia ([place]) 06/10/2020  . COPD (chronic obstructive pulmonary disease) ([place])   . Acute on chronic anemia 06/01/2020  . Class 2  obesity due to excess calories with body mass index (BMI) of 35.0 to 35.9 in adult   . Acute on chronic combined systolic and diastolic congestive heart failure ([place]) 04/27/2020  . AVM (arteriovenous malformation) of small bowel, acquired   . Gastritis and gastroduodenitis   . Gastric diverticulum   . Grade I internal hemorrhoids   . Occult blood in stools   . Heart failure ([place]) 03/20/2020  . NSTEMI (non-ST elevated myocardial infarction) ([place]) 03/20/2020  . Pneumonia due to COVID-19 virus 02/24/2019  . Acute on chronic respiratory failure with hypoxia ([place]) 02/20/2019  . COVID-19 virus detected 02/20/2019  . DNR (do not resuscitate) 02/20/2019  . PAD (peripheral artery disease) ([place]) 08/22/2016  . Osteomyelitis of foot ([place]) 07/08/2016  . S/P transmetatarsal amputation of foot, right ([place]) 06/26/2016  . Constipation due to opioid therapy 06/15/2016  . Physical deconditioning 06/10/2016  . Chronic diastolic CHF (congestive heart failure) ([place]) 06/10/2016  . Ischemic pain of right foot 06/04/2016  . Ischemia of foot 05/31/2016  . 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 Type 2 diabetes with nephropathy ([place]) 01/21/2016  . Uncontrolled type II diabetes with peripheral autonomic neuropathy ([place]) 01/17/2016  . Edema of right lower extremity 12/20/2015  . Cough 11/26/2015  . GERD (gastroesophageal reflux disease) 10/08/2015  . Amputation of left lower extremity below knee ([place]) 08/21/2015  . Phantom limb  pain ([place])   . Anemia of chronic disease   . HLD (hyperlipidemia)   . BPH (benign prostatic hyperplasia)   . ETOH abuse   . PVD (peripheral vascular disease) ([place])   . Chronic hepatitis C without hepatic coma ([place])   . History of CVA (cerebrovascular accident) without residual deficits   . Hyponatremia   . Chronic kidney disease, stage 3 ([place]) 07/18/2015  . Essential hypertension 07/18/2015  . Acute blood loss anemia 12/01/2013   . S/P lumbar spinal fusion 11/25/2013  . Melena 12/03/2011  . Hemiparesthesia 02/03/2011  . Polysubstance abuse ([place]) 02/03/2011  . Tobacco abuse 02/03/2011    Past Surgical History:  Procedure Laterality Date  . ABDOMINAL AORTOGRAM N/A 06/03/2016   Procedure: Abdominal Aortogram;  Surgeon: Waynetta Sandy, MD;  Location: [place] CV [facility];  Service: Cardiovascular;  Laterality: N/A;  . AMPUTATION Left 08/16/2015   Procedure: LEFT BELOW THE KNEE AMPUTATION ;  Surgeon: Serafina Mitchell, MD;  Location: [place];  Service: Vascular;  Laterality: Left;  . AMPUTATION Right 08/22/2016   Procedure: AMPUTATION BELOW KNEE-RIGHT;  Surgeon: Waynetta Sandy, MD;  Location: [place];  Service: Vascular;  Laterality: Right;  . BACK SURGERY    . BELOW KNEE LEG AMPUTATION Left 08/16/2015  . BIOPSY N/A 09/03/2012   Procedure: BIOPSY;  Surgeon: Daneil Dolin, MD;  [facility]: [facility];  Service: Endoscopy;  Laterality: N/A;  gastric and gastric mucosa  . BIOPSY N/A 12/03/2012   Procedure: BIOPSY;  Surgeon: Daneil Dolin, MD;  [facility]: [facility];  Service: Endoscopy;  Laterality: N/A;  . BIOPSY  03/31/2020   Procedure: BIOPSY;  Surgeon: Ladene Artist, MD;  Location: [place];  Service: Endoscopy;;  . COLONOSCOPY WITH PROPOFOL N/A 09/03/2012   YSA:[id number] polyp-removed as outlined above. Prominent internal hemorrhoids. Tubular adenoma  . COLONOSCOPY WITH PROPOFOL N/A 04/05/2020   Procedure: COLONOSCOPY WITH PROPOFOL;  Surgeon: Lavena Bullion, DO;  Location: [place];  Service: Gastroenterology;  Laterality: N/A;  . ENTEROSCOPY Left 04/05/2020   Procedure: ENTEROSCOPY;  Surgeon: Lavena Bullion, DO;  Location: [place];  Service: Gastroenterology;  Laterality: Left;  . ENTEROSCOPY N/A 04/07/2020   Procedure: ENTEROSCOPY;  Surgeon: Lavena Bullion, DO;  Location: [place];  Service: Gastroenterology;  Laterality: N/A;  . ESOPHAGOGASTRODUODENOSCOPY (EGD) WITH PROPOFOL N/A  09/03/2012   UXN:ATFTDD hernia. Gastric diverticulum. Gastric ulcers with associated erosions. Duodenal erosions. Status post gastric biopsy. H.PYLORI gastritis   . ESOPHAGOGASTRODUODENOSCOPY (EGD) WITH PROPOFOL N/A 12/03/2012   Dr. Gala Romney: gastric diverticulum, gastric erosions and scar. Previously noted gastric ulcer completed healed. Biopsy without H.pylori.   . ESOPHAGOGASTRODUODENOSCOPY (EGD) WITH PROPOFOL N/A 01/23/2016   Procedure: ESOPHAGOGASTRODUODENOSCOPY (EGD) WITH PROPOFOL;  Surgeon: Doran Stabler, MD;  Location: [facility];  Service: Gastroenterology;  Laterality: N/A;  . ESOPHAGOGASTRODUODENOSCOPY (EGD) WITH PROPOFOL N/A 03/31/2020   Procedure: ESOPHAGOGASTRODUODENOSCOPY (EGD) WITH PROPOFOL;  Surgeon: Ladene Artist, MD;  Location: [facility] ENDOSCOPY;  Service: Endoscopy;  Laterality: N/A;  . ESOPHAGOGASTRODUODENOSCOPY (EGD) WITH PROPOFOL N/A 06/02/2020   Procedure: ESOPHAGOGASTRODUODENOSCOPY (EGD) WITH PROPOFOL;  Surgeon: Eloise Harman, DO;  [facility];  Service: Endoscopy;  Laterality: N/A;  . GIVENS CAPSULE STUDY N/A 04/05/2020   Procedure: GIVENS CAPSULE STUDY;  Surgeon: Lavena Bullion, DO;  Location: [place];  Service: Gastroenterology;  Laterality: N/A;  . HOT HEMOSTASIS N/A 04/07/2020   Procedure: HOT HEMOSTASIS (ARGON PLASMA COAGULATION/BICAP);  Surgeon: Lavena Bullion, DO;  Location: [place];  Service:  Gastroenterology;  Laterality: N/A;  . I & D EXTREMITY Right 07/11/2016   Procedure: DELAY PRIMARY CLOSURE FOOT AMPUTATION;  Surgeon: Edrick Kins, DPM;  Location: [place];  Service: Podiatry;  Laterality: Right;  . LEFT HEART CATH AND CORONARY ANGIOGRAPHY N/A 03/21/2020   Procedure: LEFT HEART CATH AND CORONARY ANGIOGRAPHY;  Surgeon: Martinique, Peter M, MD;  Location: [place] CV LAB;  Service: Cardiovascular;  Laterality: N/A;  . LIVER BIOPSY  2005   Done in [place], [place]. Chronic hepatitis with mild periportal inflammation, lobular unicellular  necrosis and portal fibrosis. Grade 2, stage 1-2.  . LOWER EXTREMITY ANGIOGRAPHY Right 06/03/2016   Procedure: Lower Extremity Angiography;  Surgeon: Waynetta Sandy, MD;  Location: [place] CV LAB;  Service: Cardiovascular;  Laterality: Right;  . MAXIMUM ACCESS (MAS)POSTERIOR LUMBAR INTERBODY FUSION (PLIF) 1 LEVEL N/A 11/25/2013   Procedure: FOR MAXIMUM ACCESS (MAS) POSTERIOR LUMBAR INTERBODY FUSION (PLIF) 1 LEVEL;  Surgeon: Eustace Moore, MD;  Location: [place] [facility];  Service: Neurosurgery;  Laterality: N/A;  FOR MAXIMUM ACCESS (MAS) POSTERIOR LUMBAR INTERBODY FUSION (PLIF) 1 LEVEL LUMBAR 3-4  . PERIPHERAL VASCULAR ATHERECTOMY Right 06/03/2016   Procedure: Peripheral Vascular Atherectomy;  Surgeon: Waynetta Sandy, MD;  Location: [place] CV LAB;  Service: Cardiovascular;  Laterality: Right;  SFA WITH STENT  . PERIPHERAL VASCULAR CATHETERIZATION Left 08/01/2015   Procedure: Lower Extremity Angiography;  Surgeon: Serafina Mitchell, MD;  Location: [place] CV LAB;  Service: Cardiovascular;  Laterality: Left;  . PERIPHERAL VASCULAR CATHETERIZATION N/A 08/01/2015   Procedure: Abdominal Aortogram;  Surgeon: Serafina Mitchell, MD;  Location: [place] [facility];  Service: Cardiovascular;  Laterality: N/A;  . PERIPHERAL VASCULAR CATHETERIZATION N/A 08/08/2015   Procedure: Abdominal Aortogram w/Lower Extremity;  Surgeon: Serafina Mitchell, MD;  Location: [place] [facility] LAB;  Service: Cardiovascular;  Laterality: N/A;  . PERIPHERAL VASCULAR CATHETERIZATION Left 08/08/2015   Procedure: Peripheral Vascular Balloon Angioplasty;  Surgeon: Serafina Mitchell, MD;  Location: [place] [facility];  Service: Cardiovascular;  Laterality: Left;  left popiteal artery, left peronealtrunk, left post tibial  . POLYPECTOMY N/A 09/03/2012   Procedure: POLYPECTOMY;  Surgeon: Daneil Dolin, MD;  [facility];  Service: Endoscopy;  Laterality: N/A;  cecal polyp  . SUBMUCOSAL TATTOO INJECTION  04/07/2020    Procedure: SUBMUCOSAL TATTOO INJECTION;  Surgeon: Lavena Bullion, DO;  [facility];  Service: Gastroenterology;;  . TONSILLECTOMY    . TRANSMETATARSAL AMPUTATION Right 06/13/2016   Procedure: RIGHT TRANSMETATARSAL AMPUTATION;  Surgeon: Waynetta Sandy, MD;  Location: [place];  Service: Vascular;  Laterality: Right;  . TRANSMETATARSAL AMPUTATION Right 07/10/2016   Procedure: REVISION RIGHT TRANSMETATARSAL AMPUTATION;  Surgeon: Waynetta Sandy, MD;  Location: [facility] OR;  Service: Vascular;  Laterality: Right;       Family History  Problem Relation Age of Onset  . Breast cancer Mother   . Heart failure Mother   . Diabetes Father   . Hypertension Father   . Heart disease Father   . Hyperlipidemia Father   . Diabetes Sister   . Hypertension Sister   . Breast cancer Sister   . Colon cancer Neg Hx   . Liver disease Neg Hx     Social History   Tobacco Use  . Smoking status: Former Smoker    Packs/day: 1.00    Years: 47.00    Pack years: 47.00    Types: Cigarettes    Quit date: 07/13/2015    Years  since quitting: 4.9  . Smokeless tobacco: Never Used  Vaping Use  . Vaping Use: Never used  Substance Use Topics  . Alcohol use: No    Alcohol/week: 0.0 standard drinks    Comment:  none 08/15/2015 "stopped drinking 3-4 years ago"  . Drug use: No    Comment: 08/15/2015 "last used cocaine 3-4 months ago"    Home Medications Prior to Admission medications   Medication Sig Start Date End Date Taking? Authorizing Provider  acetaminophen (TYLENOL) 325 MG tablet Take 2 tablets (650 mg total) by mouth every 6 (six) hours as needed for mild pain or headache (fever >/= 101). 02/24/19   Roxan Hockey, MD  aspirin EC 81 MG tablet Take 1 tablet (81 mg total) by mouth daily with breakfast. 02/24/19   Roxan Hockey, MD  baclofen (LIORESAL) 10 MG tablet Take 1 tablet (10 mg total) by mouth every 8 (eight) hours as needed for muscle spasms. For shoulder spasm 06/03/20   Manuella Ghazi,  Pratik D, DO  carvedilol (COREG) 25 MG tablet Take 1 tablet (25 mg total) by mouth 2 (two) times daily with a meal. 04/08/20   Regalado, Belkys A, MD  cefTRIAXone (ROCEPHIN) 1 g injection Inject 1 g into the muscle daily. For 5 days last dose yesterday 06/28/20   [provider]  cloNIDine (CATAPRES) 0.1 MG tablet Take 0.1 mg by mouth at bedtime.    [provider]  clopidogrel (PLAVIX) 75 MG tablet Take 75 mg by mouth daily. Patient not taking: Reported on 07/03/2020    [provider]  DULoxetine (CYMBALTA) 60 MG capsule Take 60 mg by mouth daily.    [provider]  ferrous gluconate (FERGON) 324 MG tablet Take 324 mg by mouth daily with breakfast.    [provider]  furosemide (LASIX) 20 MG tablet Take 80 mg by mouth daily. 06/09/20   [provider]  gabapentin (NEURONTIN) 300 MG capsule Take 300 mg by mouth 3 (three) times daily.    [provider]  hydrALAZINE (APRESOLINE) 50 MG tablet Take 1 tablet (50 mg total) by mouth daily. 06/24/20   Barton Dubois, MD  HYDROcodone-acetaminophen (NORCO/VICODIN) 5-325 MG tablet Take 1 tablet by mouth 4 (four) times daily as needed. 06/26/20   [provider]  insulin glargine (LANTUS) 100 UNIT/ML injection Inject 0.18 mLs (18 Units total) into the skin at bedtime. 06/24/20   Barton Dubois, MD  insulin lispro (HUMALOG) 100 UNIT/ML injection Inject 0.05 mLs (5 Units total) into the skin in the morning, at noon, and at bedtime. Give only if eats 50% or more of meal. Patient taking differently: Inject 8 Units into the skin in the morning, at noon, and at bedtime. Give only if eats 50% or more of meal. 06/24/20   Barton Dubois, MD  Insulin Lispro-aabc 200 UNIT/ML SOPN Inject 2-8 Units into the skin 3 (three) times daily with meals. And at bedtime. 200-250=2units 251-300=4units 301-350=6units 351-400=8units    [provider]  ipratropium-albuterol (DUONEB) 0.5-2.5 (3) MG/3ML SOLN Take  3 mLs by nebulization every 6 (six) hours as needed (SOB/ wheezing).    [provider]  iron polysaccharides (NIFEREX) 150 MG capsule Take 1 capsule (150 mg total) by mouth 2 (two) times daily. Patient not taking: Reported on 07/03/2020 06/24/20   Barton Dubois, MD  isosorbide mononitrate (IMDUR) 120 MG 24 hr tablet Take 1 tablet (120 mg total) by mouth daily. 04/08/20   Regalado, Belkys A, MD  Lidocaine 4 % PTCH Apply  1 patch topically daily. Apply 1 patch to both stumps once daily, 12 hour use    [provider]  loratadine (CLARITIN) 10 MG tablet Take 10 mg by mouth daily.    [provider]  Melatonin 5 MG TABS Take 1 tablet by mouth at bedtime.    [provider]  pantoprazole (PROTONIX) 40 MG tablet Take 1 tablet (40 mg total) by mouth 2 (two) times daily. 06/24/20   Barton Dubois, MD  polyethylene glycol (MIRALAX / GLYCOLAX) 17 g packet Take 17 g by mouth daily.    [provider]  rosuvastatin (CRESTOR) 20 MG tablet Take 1 tablet (20 mg total) by mouth daily. Patient taking differently: Take 20 mg by mouth at bedtime. 04/08/20   Regalado, Belkys A, MD  senna (SENOKOT) 8.6 MG tablet Take 1 tablet by mouth 2 (two) times daily.    [provider]  tamsulosin (FLOMAX) 0.4 MG CAPS capsule Take 0.4 mg by mouth daily.    [provider]  vitamin B-12 (CYANOCOBALAMIN) 500 MCG tablet Take 500 mcg by mouth daily.     [provider]    Allergies    No known allergies  Review of Systems   Review of Systems  Constitutional: Negative for fever.  Respiratory: Negative for shortness of breath.   Cardiovascular: Negative for chest pain.  Gastrointestinal: Positive for abdominal pain and constipation. Negative for diarrhea, nausea and vomiting.  Genitourinary: Negative for difficulty urinating and dysuria.  Musculoskeletal: Negative for arthralgias and myalgias.  Skin: Negative for rash and wound.  Allergic/Immunologic: Positive  for immunocompromised state.  Neurological: Negative for weakness and numbness.  Psychiatric/Behavioral: Negative for confusion.  All other systems reviewed and are negative.   Physical Exam Updated Vital Signs BP (!) 189/89 (BP Location: Left Arm)   Pulse 67   Temp 98.3 F (36.8 C)   Resp 19   Ht 6\' 1"  (1.854 m)   Wt 113.4 kg   SpO2 100%   BMI 32.98 kg/m   Physical Exam Vitals and nursing note reviewed.  Constitutional:      General: He is not in acute distress.    Appearance: He is well-developed. He is not diaphoretic.  HENT:     Head: Normocephalic and atraumatic.  Cardiovascular:     Rate and Rhythm: Normal rate and regular rhythm.     Heart sounds: Normal heart sounds.  Pulmonary:     Effort: Pulmonary effort is normal.     Breath sounds: Examination of the right-lower field reveals rales. Examination of the left-lower field reveals rales. Rales present.  Abdominal:     Palpations: Abdomen is soft.     Tenderness: There is abdominal tenderness in the right lower quadrant.  Musculoskeletal:     Comments: Bilateral BKA  Skin:    General: Skin is warm and dry.     Findings: No erythema or rash.  Neurological:     Mental Status: He is alert and oriented to person, place, and time.  Psychiatric:        Behavior: Behavior normal.     ED Results / Procedures / Treatments   Labs (all labs ordered are listed, but only abnormal results are displayed) Labs Reviewed  CBC WITH DIFFERENTIAL/PLATELET - Abnormal; Notable for the following components:      Result Value   WBC 3.5 (*)    RBC 2.76 (*)    Hemoglobin 8.4 (*)    HCT 27.2 (*)    All other  components within normal limits  COMPREHENSIVE METABOLIC PANEL - Abnormal; Notable for the following components:   Sodium 133 (*)    Chloride 96 (*)    CO2 34 (*)    Glucose, Bld 202 (*)    Creatinine, Ser 1.75 (*)    Calcium 8.6 (*)    Total Protein 9.4 (*)    Albumin 2.5 (*)    AST 12 (*)    GFR, Estimated 42 (*)     Anion gap 3 (*)    All other components within normal limits  URINALYSIS, ROUTINE W REFLEX MICROSCOPIC - Abnormal; Notable for the following components:   Protein, ur 30 (*)    All other components within normal limits  LIPASE, BLOOD    EKG None  Radiology DG Chest Port 1 View  Result Date: 07/03/2020 CLINICAL DATA:  70 year old male with left side chest pain. EXAM: PORTABLE CHEST 1 VIEW COMPARISON:  Chest radiographs 06/13/2020 and earlier. FINDINGS: Portable AP upright view at 0656 hours. Stable lung volumes and mediastinal contours. Heart size at the upper limits of normal. Visualized tracheal air column is within normal limits. No pneumothorax or pleural effusion. Allowing for portable technique the lungs are clear. Chronic degenerative and/or posttraumatic osteophytosis about the right shoulder. No acute osseous abnormality identified. IMPRESSION: No acute cardiopulmonary abnormality. Electronically Signed   By: Genevie Ann M.D.   On: 07/03/2020 07:25    Procedures Procedures   Medications Ordered in ED Medications - No data to display  ED Course  I have reviewed the triage vital signs and the nursing notes.  Pertinent labs & imaging results that were available during my care of the patient were reviewed by me and considered in my medical decision making (see chart for details).  Clinical Course as of 07/04/20 2322  Tue Jul 04, 6145  1847 70 year old male with complaint of right lower abdominal pain and constipation, recurrent in nature.  On exam found to have mild right lower abdominal tenderness.  CBC without significant changes, downtrending hemoglobin currently 8.4, previously 9.2, chronic bleed from AVM.  Not symptomatic at this time. [LM]  2240 Urine with protein otherwise unremarkable.  Plan is to discharge to facility to manage constipation. [LM]    Clinical Course User Index [LM] Roque Lias   MDM Rules/Calculators/A&P                          Final  Clinical Impression(s) / ED Diagnoses Final diagnoses:  Right lower quadrant abdominal pain  Constipation, unspecified constipation type    Rx / DC Orders ED Discharge Orders    None       Roque Lias 07/04/20 2322    Noemi Chapel, MD 07/05/20 1810

## 2020-07-04 NOTE — ED Notes (Signed)
Notified [facility] that pt will be returning to their facility.

## 2020-07-04 NOTE — ED Notes (Signed)
Upon RN assessment, pt c/o pain more in his right flank and right side rather than RLQ of abdomen. EDP at bedside notified.

## 2020-07-04 NOTE — ED Triage Notes (Signed)
Pt brought in by [facility] from [facility]. Pt reports he has difficulty urinating and has right lower abdominal pain. Pt reported to EMS that he felt like something was blocking him from being able to urinate. Pt wears 3L O2 via [place] continuously.

## 2020-07-04 NOTE — ED Notes (Signed)
c-com called at this time for transport back to [place]

## 2020-07-06 ENCOUNTER — Ambulatory Visit (HOSPITAL_COMMUNITY): Payer: Medicare (Managed Care) | Admitting: Hematology

## 2020-07-06 DIAGNOSIS — D649 Anemia, unspecified: Secondary | ICD-10-CM

## 2020-07-07 ENCOUNTER — Emergency Department (HOSPITAL_COMMUNITY)
Admission: EM | Admit: 2020-07-07 | Discharge: 2020-07-07 | Disposition: A | Discharge status: Home / Self Care | Payer: Medicare (Managed Care) | Attending: Emergency Medicine | Admitting: Emergency Medicine

## 2020-07-07 ENCOUNTER — Encounter (HOSPITAL_COMMUNITY): Payer: Self-pay

## 2020-07-07 ENCOUNTER — Other Ambulatory Visit: Payer: Self-pay

## 2020-07-07 ENCOUNTER — Emergency Department (HOSPITAL_COMMUNITY): Payer: Medicare (Managed Care)

## 2020-07-07 DIAGNOSIS — R531 Weakness: Secondary | ICD-10-CM | POA: Insufficient documentation

## 2020-07-07 DIAGNOSIS — I5043 Acute on chronic combined systolic (congestive) and diastolic (congestive) heart failure: Secondary | ICD-10-CM | POA: Diagnosis not present

## 2020-07-07 DIAGNOSIS — E1122 Type 2 diabetes mellitus with diabetic chronic kidney disease: Secondary | ICD-10-CM | POA: Insufficient documentation

## 2020-07-07 DIAGNOSIS — Z7982 Long term (current) use of aspirin: Secondary | ICD-10-CM | POA: Insufficient documentation

## 2020-07-07 DIAGNOSIS — Z794 Long term (current) use of insulin: Secondary | ICD-10-CM | POA: Diagnosis not present

## 2020-07-07 DIAGNOSIS — B9689 Other specified bacterial agents as the cause of diseases classified elsewhere: Secondary | ICD-10-CM | POA: Diagnosis not present

## 2020-07-07 DIAGNOSIS — I130 Hypertensive heart and chronic kidney disease with heart failure and stage 1 through stage 4 chronic kidney disease, or unspecified chronic kidney disease: Secondary | ICD-10-CM | POA: Insufficient documentation

## 2020-07-07 DIAGNOSIS — J019 Acute sinusitis, unspecified: Secondary | ICD-10-CM | POA: Insufficient documentation

## 2020-07-07 DIAGNOSIS — N390 Urinary tract infection, site not specified: Secondary | ICD-10-CM | POA: Insufficient documentation

## 2020-07-07 DIAGNOSIS — I2510 Atherosclerotic heart disease of native coronary artery without angina pectoris: Secondary | ICD-10-CM | POA: Insufficient documentation

## 2020-07-07 DIAGNOSIS — R410 Disorientation, unspecified: Secondary | ICD-10-CM | POA: Insufficient documentation

## 2020-07-07 DIAGNOSIS — R059 Cough, unspecified: Secondary | ICD-10-CM | POA: Diagnosis present

## 2020-07-07 DIAGNOSIS — Z87891 Personal history of nicotine dependence: Secondary | ICD-10-CM | POA: Insufficient documentation

## 2020-07-07 DIAGNOSIS — N183 Chronic kidney disease, stage 3 unspecified: Secondary | ICD-10-CM | POA: Diagnosis not present

## 2020-07-07 DIAGNOSIS — Z79899 Other long term (current) drug therapy: Secondary | ICD-10-CM | POA: Insufficient documentation

## 2020-07-07 DIAGNOSIS — J441 Chronic obstructive pulmonary disease with (acute) exacerbation: Secondary | ICD-10-CM | POA: Insufficient documentation

## 2020-07-07 LAB — CBC WITH DIFFERENTIAL/PLATELET
Abs Immature Granulocytes: 0.01 10*3/uL (ref 0.00–0.07)
Basophils Absolute: 0 10*3/uL (ref 0.0–0.1)
Basophils Relative: 0 %
Eosinophils Absolute: 0.1 10*3/uL (ref 0.0–0.5)
Eosinophils Relative: 4 %
HCT: 28.5 % — ABNORMAL LOW (ref 39.0–52.0)
Hemoglobin: 8.9 g/dL — ABNORMAL LOW (ref 13.0–17.0)
Immature Granulocytes: 0 %
Lymphocytes Relative: 30 %
Lymphs Abs: 1 10*3/uL (ref 0.7–4.0)
MCH: 30.4 pg (ref 26.0–34.0)
MCHC: 31.2 g/dL (ref 30.0–36.0)
MCV: 97.3 fL (ref 80.0–100.0)
Monocytes Absolute: 0.2 10*3/uL (ref 0.1–1.0)
Monocytes Relative: 6 %
Neutro Abs: 2.1 10*3/uL (ref 1.7–7.7)
Neutrophils Relative %: 60 %
Platelets: 219 10*3/uL (ref 150–400)
RBC: 2.93 MIL/uL — ABNORMAL LOW (ref 4.22–5.81)
RDW: 13.9 % (ref 11.5–15.5)
WBC: 3.4 10*3/uL — ABNORMAL LOW (ref 4.0–10.5)
nRBC: 0 % (ref 0.0–0.2)

## 2020-07-07 LAB — URINALYSIS, ROUTINE W REFLEX MICROSCOPIC
Bilirubin Urine: NEGATIVE
Glucose, UA: NEGATIVE mg/dL
Hgb urine dipstick: NEGATIVE
Ketones, ur: NEGATIVE mg/dL
Nitrite: NEGATIVE
Protein, ur: 30 mg/dL — AB
Specific Gravity, Urine: 1.011 (ref 1.005–1.030)
pH: 6 (ref 5.0–8.0)

## 2020-07-07 LAB — COMPREHENSIVE METABOLIC PANEL
ALT: 12 U/L (ref 0–44)
AST: 15 U/L (ref 15–41)
Albumin: 2.7 g/dL — ABNORMAL LOW (ref 3.5–5.0)
Alkaline Phosphatase: 59 U/L (ref 38–126)
Anion gap: 3 — ABNORMAL LOW (ref 5–15)
BUN: 18 mg/dL (ref 8–23)
CO2: 34 mmol/L — ABNORMAL HIGH (ref 22–32)
Calcium: 8.4 mg/dL — ABNORMAL LOW (ref 8.9–10.3)
Chloride: 93 mmol/L — ABNORMAL LOW (ref 98–111)
Creatinine, Ser: 1.59 mg/dL — ABNORMAL HIGH (ref 0.61–1.24)
GFR, Estimated: 47 mL/min — ABNORMAL LOW (ref >60)
Glucose, Bld: 247 mg/dL — ABNORMAL HIGH (ref 70–99)
Potassium: 4.2 mmol/L (ref 3.5–5.1)
Sodium: 130 mmol/L — ABNORMAL LOW (ref 135–145)
Total Bilirubin: 0.4 mg/dL (ref 0.3–1.2)
Total Protein: 9.9 g/dL — ABNORMAL HIGH (ref 6.5–8.1)

## 2020-07-07 LAB — BRAIN NATRIURETIC PEPTIDE: B Natriuretic Peptide: 326 pg/mL — ABNORMAL HIGH (ref 0.0–100.0)

## 2020-07-07 LAB — TROPONIN I (HIGH SENSITIVITY)
Troponin I (High Sensitivity): 17 ng/L (ref <18)
Troponin I (High Sensitivity): 19 ng/L — ABNORMAL HIGH (ref <18)

## 2020-07-07 LAB — CBG MONITORING, ED: Glucose-Capillary: 210 mg/dL — ABNORMAL HIGH (ref 70–99)

## 2020-07-07 MED ORDER — SODIUM CHLORIDE 0.9 % IV BOLUS: 500.0000 mL | Freq: Once | INTRAVENOUS | Status: DC

## 2020-07-07 MED ORDER — AMOXICILLIN-POT CLAVULANATE 500-125 MG PO TABS: 1.0000 | ORAL_TABLET | Freq: Two times a day (BID) | ORAL | 0 refills | Status: AC

## 2020-07-07 MED ORDER — HYDROCODONE-ACETAMINOPHEN 5-325 MG PO TABS
1.0000 | ORAL_TABLET | Freq: Once | ORAL | Status: AC
  Administered 2020-07-07: 1 via ORAL
  Filled 2020-07-07: qty 1

## 2020-07-07 MED ORDER — FUROSEMIDE 10 MG/ML IJ SOLN
40.0000 mg | Freq: Once | INTRAMUSCULAR | Status: AC
  Administered 2020-07-07: 40 mg via INTRAVENOUS
  Filled 2020-07-07: qty 4

## 2020-07-07 MED ORDER — SODIUM CHLORIDE 0.9 % IV SOLN
1.0000 g | Freq: Once | INTRAVENOUS | Status: AC
  Administered 2020-07-07: 1 g via INTRAVENOUS
  Filled 2020-07-07: qty 10

## 2020-07-07 NOTE — ED Triage Notes (Signed)
Pt presents to ED via [facility] for AMS from [facility]. Pt wears 3L O2 via [place] continuously. Pt alert and oriented x 4.

## 2020-07-07 NOTE — ED Provider Notes (Signed)
[facility] EMERGENCY DEPARTMENT Provider Note   CSN: [id number] Arrival date & time: 07/07/20  1133     History Chief Complaint  Patient presents with  . Altered Mental Status    Cory Weber is a 70 y.o. male with PMHx HTN, HLD, Type II DM, CVA on Plavix, COPD, CAD, CKD, CHF with EF 40-45% who presents to the ED today via EMS from [place] for [place].   Pt reports he is here due to generalized weakness. He is also complaining of a dry cough for the past 2 weeks as well as chronic back pain and ongoing RLQ abdominal pain that he has been seen for several times in the past. Pt reports he has not gotten an answer to what is going on with his abdominal pain. He denies nausea, vomiting. He reports he was constipated but he is now having regular BMs. Per chart review pt was seen in the ED on 05/24 for abdominal pain with normal lab work and discharged back to his facility. He was also seen on 05/23 for chest pain with cough with a negative CXR.   Per [employer] Staff they report that this morning pt was altered and did not recognize any of the staff at [facility]. The NP at the site evaluated him and thought his chest sounded congested. They deny any unilateral weakness, facial droop, speech changes.   The history is provided by the patient and medical records.       Past Medical History:  Diagnosis Date  . Acute anemia 06/01/2020  . Anemia   . Asthma   . BPH (benign prostatic hyperplasia)   . CAD (coronary artery disease)    Severe multivessel disease 03/2020 - poor revascularization options  . Chronic diastolic heart failure ([place])   . CKD (chronic kidney disease) stage 3, GFR 30-59 ml/min (HCC)   . COPD (chronic obstructive pulmonary disease) ([place])   . CVA (cerebral vascular accident) ([place]) 2015  . Essential hypertension   . Gangrene ([place]) 06/10/2016  . GERD (gastroesophageal reflux disease)   . Hepatitis C   . History of pneumonia   . Hyperlipidemia   . Osteomyelitis ([place])   . PAD  (peripheral artery disease) (HCC)    Bilateral BKA  . Peripheral neuropathy   . Type II diabetes mellitus [facility])     Patient Active Problem List   Diagnosis Date Noted  . Chronic respiratory failure with hypoxia ([place])   . Symptomatic anemia 06/23/2020  . COPD with acute exacerbation ([place]) 06/10/2020  . Acute respiratory failure with hypoxia and hypercarbia ([place]) 06/10/2020  . COPD (chronic obstructive pulmonary disease) ([place])   . Acute on chronic anemia 06/01/2020  . Class 2 obesity due to excess calories with body mass index (BMI) of 35.0 to 35.9 in adult   . Acute on chronic combined systolic and diastolic congestive heart failure ([place]) 04/27/2020  . AVM (arteriovenous malformation) of small bowel, acquired   . Gastritis and gastroduodenitis   . Gastric diverticulum   . Grade I internal hemorrhoids   . Occult blood in stools   . Heart failure ([place]) 03/20/2020  . NSTEMI (non-ST elevated myocardial infarction) ([place]) 03/20/2020  . Pneumonia due to COVID-19 virus 02/24/2019  . Acute on chronic respiratory failure with hypoxia ([place]) 02/20/2019  . COVID-19 virus detected 02/20/2019  . DNR (do not resuscitate) 02/20/2019  . PAD (peripheral artery disease) ([place]) 08/22/2016  . Osteomyelitis of foot ([place]) 07/08/2016  . S/P transmetatarsal amputation of foot, right ([place])  06/26/2016  . Constipation due to opioid therapy 06/15/2016  . Physical deconditioning 06/10/2016  . Chronic diastolic CHF (congestive heart failure) ([place]) 06/10/2016  . Ischemic pain of right foot 06/04/2016  . Ischemia of foot 05/31/2016  . 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 Type 2 diabetes with nephropathy ([place]) 01/21/2016  . Uncontrolled type II diabetes with peripheral autonomic neuropathy ([place]) 01/17/2016  . Edema of right lower extremity 12/20/2015  . Cough 11/26/2015  . GERD (gastroesophageal reflux disease) 10/08/2015  . Amputation of left lower  extremity below knee ([place]) 08/21/2015  . Phantom limb pain ([place])   . Anemia of chronic disease   . HLD (hyperlipidemia)   . BPH (benign prostatic hyperplasia)   . ETOH abuse   . PVD (peripheral vascular disease) ([place])   . Chronic hepatitis C without hepatic coma ([place])   . History of CVA (cerebrovascular accident) without residual deficits   . Hyponatremia   . Chronic kidney disease, stage 3 ([place]) 07/18/2015  . Essential hypertension 07/18/2015  . Acute blood loss anemia 12/01/2013  . S/P lumbar spinal fusion 11/25/2013  . Melena 12/03/2011  . Hemiparesthesia 02/03/2011  . Polysubstance abuse ([place]) 02/03/2011  . Tobacco abuse 02/03/2011    Past Surgical History:  Procedure Laterality Date  . ABDOMINAL AORTOGRAM N/A 06/03/2016   Procedure: Abdominal Aortogram;  Surgeon: Waynetta Sandy, MD;  Location: [place] [facility];  Service: Cardiovascular;  Laterality: N/A;  . AMPUTATION Left 08/16/2015   Procedure: LEFT BELOW THE KNEE AMPUTATION ;  Surgeon: Serafina Mitchell, MD;  Location: [place];  Service: Vascular;  Laterality: Left;  . AMPUTATION Right 08/22/2016   Procedure: AMPUTATION BELOW KNEE-RIGHT;  Surgeon: Waynetta Sandy, MD;  Location: [place];  Service: Vascular;  Laterality: Right;  . BACK SURGERY    . BELOW KNEE LEG AMPUTATION Left 08/16/2015  . BIOPSY N/A 09/03/2012   Procedure: BIOPSY;  Surgeon: Daneil Dolin, MD;  Location: [facility];  Service: Endoscopy;  Laterality: N/A;  gastric and gastric mucosa  . BIOPSY N/A 12/03/2012   Procedure: BIOPSY;  Surgeon: Daneil Dolin, MD;  Location: [facility];  Service: Endoscopy;  Laterality: N/A;  . BIOPSY  03/31/2020   Procedure: BIOPSY;  Surgeon: Ladene Artist, MD;  Location: [place];  Service: Endoscopy;;  . COLONOSCOPY WITH PROPOFOL N/A 09/03/2012   YQI:[id number] polyp-removed as outlined above. Prominent internal hemorrhoids. Tubular adenoma  . COLONOSCOPY WITH PROPOFOL N/A 04/05/2020   Procedure: COLONOSCOPY WITH  PROPOFOL;  Surgeon: Lavena Bullion, DO;  Location: [place];  Service: Gastroenterology;  Laterality: N/A;  . ENTEROSCOPY Left 04/05/2020   Procedure: ENTEROSCOPY;  Surgeon: Lavena Bullion, DO;  Location: [place];  Service: Gastroenterology;  Laterality: Left;  . ENTEROSCOPY N/A 04/07/2020   Procedure: ENTEROSCOPY;  Surgeon: Lavena Bullion, DO;  Location: [place];  Service: Gastroenterology;  Laterality: N/A;  . ESOPHAGOGASTRODUODENOSCOPY (EGD) WITH PROPOFOL N/A 09/03/2012   DGL:OVFIEP hernia. Gastric diverticulum. Gastric ulcers with associated erosions. Duodenal erosions. Status post gastric biopsy. H.PYLORI gastritis   . ESOPHAGOGASTRODUODENOSCOPY (EGD) WITH PROPOFOL N/A 12/03/2012   Dr. Gala Romney: gastric diverticulum, gastric erosions and scar. Previously noted gastric ulcer completed healed. Biopsy without H.pylori.   . ESOPHAGOGASTRODUODENOSCOPY (EGD) WITH PROPOFOL N/A 01/23/2016   Procedure: ESOPHAGOGASTRODUODENOSCOPY (EGD) WITH PROPOFOL;  Surgeon: Doran Stabler, MD;  Location: [facility];  Service: Gastroenterology;  Laterality: N/A;  . ESOPHAGOGASTRODUODENOSCOPY (EGD) WITH PROPOFOL N/A 03/31/2020  Procedure: ESOPHAGOGASTRODUODENOSCOPY (EGD) WITH PROPOFOL;  Surgeon: Ladene Artist, MD;  Location: [facility] ENDOSCOPY;  Service: Endoscopy;  Laterality: N/A;  . ESOPHAGOGASTRODUODENOSCOPY (EGD) WITH PROPOFOL N/A 06/02/2020   Procedure: ESOPHAGOGASTRODUODENOSCOPY (EGD) WITH PROPOFOL;  Surgeon: Eloise Harman, DO;  [facility];  Service: Endoscopy;  Laterality: N/A;  . GIVENS CAPSULE STUDY N/A 04/05/2020   Procedure: GIVENS CAPSULE STUDY;  Surgeon: Lavena Bullion, DO;  Location: [place];  Service: Gastroenterology;  Laterality: N/A;  . HOT HEMOSTASIS N/A 04/07/2020   Procedure: HOT HEMOSTASIS (ARGON PLASMA COAGULATION/BICAP);  Surgeon: Lavena Bullion, DO;  Location: [facility] ENDOSCOPY;  Service: Gastroenterology;  Laterality: N/A;  . I & D EXTREMITY Right  07/11/2016   Procedure: DELAY PRIMARY CLOSURE FOOT AMPUTATION;  Surgeon: Edrick Kins, DPM;  Location: [place];  Service: Podiatry;  Laterality: Right;  . LEFT HEART CATH AND CORONARY ANGIOGRAPHY N/A 03/21/2020   Procedure: LEFT HEART CATH AND CORONARY ANGIOGRAPHY;  Surgeon: Martinique, Peter M, MD;  Location: [place] [facility];  Service: Cardiovascular;  Laterality: N/A;  . LIVER BIOPSY  2005   Done in [place], [place]. Chronic hepatitis with mild periportal inflammation, lobular unicellular necrosis and portal fibrosis. Grade 2, stage 1-2.  . LOWER EXTREMITY ANGIOGRAPHY Right 06/03/2016   Procedure: Lower Extremity Angiography;  Surgeon: Waynetta Sandy, MD;  Location: [place] [facility];  Service: Cardiovascular;  Laterality: Right;  . MAXIMUM ACCESS (MAS)POSTERIOR LUMBAR INTERBODY FUSION (PLIF) 1 LEVEL N/A 11/25/2013   Procedure: FOR MAXIMUM ACCESS (MAS) POSTERIOR LUMBAR INTERBODY FUSION (PLIF) 1 LEVEL;  Surgeon: Eustace Moore, MD;  Location: [place] [facility];  Service: Neurosurgery;  Laterality: N/A;  FOR MAXIMUM ACCESS (MAS) POSTERIOR LUMBAR INTERBODY FUSION (PLIF) 1 LEVEL LUMBAR 3-4  . PERIPHERAL VASCULAR ATHERECTOMY Right 06/03/2016   Procedure: Peripheral Vascular Atherectomy;  Surgeon: Waynetta Sandy, MD;  Location: [place] [facility];  Service: Cardiovascular;  Laterality: Right;  SFA WITH STENT  . PERIPHERAL VASCULAR CATHETERIZATION Left 08/01/2015   Procedure: Lower Extremity Angiography;  Surgeon: Serafina Mitchell, MD;  Location: [place] [facility];  Service: Cardiovascular;  Laterality: Left;  . PERIPHERAL VASCULAR CATHETERIZATION N/A 08/01/2015   Procedure: Abdominal Aortogram;  Surgeon: Serafina Mitchell, MD;  Location: [place] [facility];  Service: Cardiovascular;  Laterality: N/A;  . PERIPHERAL VASCULAR CATHETERIZATION N/A 08/08/2015   Procedure: Abdominal Aortogram w/Lower Extremity;  Surgeon: Serafina Mitchell, MD;  Location: [place] [facility];  Service: Cardiovascular;   Laterality: N/A;  . PERIPHERAL VASCULAR CATHETERIZATION Left 08/08/2015   Procedure: Peripheral Vascular Balloon Angioplasty;  Surgeon: Serafina Mitchell, MD;  Location: [place] [facility];  Service: Cardiovascular;  Laterality: Left;  left popiteal artery, left peronealtrunk, left post tibial  . POLYPECTOMY N/A 09/03/2012   Procedure: POLYPECTOMY;  Surgeon: Daneil Dolin, MD;  Location: [facility];  Service: Endoscopy;  Laterality: N/A;  cecal polyp  . SUBMUCOSAL TATTOO INJECTION  04/07/2020   Procedure: SUBMUCOSAL TATTOO INJECTION;  Surgeon: Lavena Bullion, DO;  Location: [facility];  Service: Gastroenterology;;  . TONSILLECTOMY    . TRANSMETATARSAL AMPUTATION Right 06/13/2016   Procedure: RIGHT TRANSMETATARSAL AMPUTATION;  Surgeon: Waynetta Sandy, MD;  Location: [place];  Service: Vascular;  Laterality: Right;  . TRANSMETATARSAL AMPUTATION Right 07/10/2016   Procedure: REVISION RIGHT TRANSMETATARSAL AMPUTATION;  Surgeon: Waynetta Sandy, MD;  [facility];  Service: Vascular;  Laterality: Right;       Family History  Problem Relation Age of Onset  .  Breast cancer Mother   . Heart failure Mother   . Diabetes Father   . Hypertension Father   . Heart disease Father   . Hyperlipidemia Father   . Diabetes Sister   . Hypertension Sister   . Breast cancer Sister   . Colon cancer Neg Hx   . Liver disease Neg Hx     Social History   Tobacco Use  . Smoking status: Former Smoker    Packs/day: 1.00    Years: 47.00    Pack years: 47.00    Types: Cigarettes    Quit date: 07/13/2015    Years since quitting: 4.9  . Smokeless tobacco: Never Used  Vaping Use  . Vaping Use: Never used  Substance Use Topics  . Alcohol use: No    Alcohol/week: 0.0 standard drinks    Comment:  none 08/15/2015 "stopped drinking 3-4 years ago"  . Drug use: No    Comment: 08/15/2015 "last used cocaine 3-4 months ago"    Home Medications Prior to Admission medications   Medication Sig Start  Date End Date Taking? Authorizing Provider  amoxicillin-clavulanate (AUGMENTIN) 500-125 MG tablet Take 1 tablet (500 mg total) by mouth in the morning and at bedtime for 7 days. 07/07/20 07/14/20 Yes Elisa Kutner, PA-C  acetaminophen (TYLENOL) 325 MG tablet Take 2 tablets (650 mg total) by mouth every 6 (six) hours as needed for mild pain or headache (fever >/= 101). 02/24/19   Roxan Hockey, MD  aspirin EC 81 MG tablet Take 1 tablet (81 mg total) by mouth daily with breakfast. 02/24/19   Roxan Hockey, MD  baclofen (LIORESAL) 10 MG tablet Take 1 tablet (10 mg total) by mouth every 8 (eight) hours as needed for muscle spasms. For shoulder spasm 06/03/20   Manuella Ghazi, Pratik D, DO  carvedilol (COREG) 25 MG tablet Take 1 tablet (25 mg total) by mouth 2 (two) times daily with a meal. 04/08/20   Regalado, Belkys A, MD  cefTRIAXone (ROCEPHIN) 1 g injection Inject 1 g into the muscle daily. For 5 days last dose yesterday 06/28/20   [provider]  cloNIDine (CATAPRES) 0.1 MG tablet Take 0.1 mg by mouth at bedtime.    [provider]  clopidogrel (PLAVIX) 75 MG tablet Take 75 mg by mouth daily. Patient not taking: Reported on 07/03/2020    [provider]  DULoxetine (CYMBALTA) 60 MG capsule Take 60 mg by mouth daily.    [provider]  ferrous gluconate (FERGON) 324 MG tablet Take 324 mg by mouth daily with breakfast.    [provider]  furosemide (LASIX) 20 MG tablet Take 80 mg by mouth daily. 06/09/20   [provider]  gabapentin (NEURONTIN) 300 MG capsule Take 300 mg by mouth 3 (three) times daily.    [provider]  glycerin adult 2 g suppository Place 1 suppository rectally as needed for constipation. 07/04/20   Tacy Learn, PA-C  hydrALAZINE (APRESOLINE) 50 MG tablet Take 1 tablet (50 mg total) by mouth daily. 06/24/20   Barton Dubois, MD  HYDROcodone-acetaminophen (NORCO/VICODIN) 5-325 MG tablet Take 1 tablet by mouth 4 (four) times  daily as needed. 06/26/20   [provider]  insulin glargine (LANTUS) 100 UNIT/ML injection Inject 0.18 mLs (18 Units total) into the skin at bedtime. 06/24/20   Barton Dubois, MD  insulin lispro (HUMALOG) 100 UNIT/ML injection Inject 0.05 mLs (5 Units total) into the skin in the morning, at noon, and at bedtime. Give only if  eats 50% or more of meal. Patient taking differently: Inject 8 Units into the skin in the morning, at noon, and at bedtime. Give only if eats 50% or more of meal. 06/24/20   Barton Dubois, MD  Insulin Lispro-aabc 200 UNIT/ML SOPN Inject 2-8 Units into the skin 3 (three) times daily with meals. And at bedtime. 200-250=2units 251-300=4units 301-350=6units 351-400=8units    [provider]  ipratropium-albuterol (DUONEB) 0.5-2.5 (3) MG/3ML SOLN Take 3 mLs by nebulization every 6 (six) hours as needed (SOB/ wheezing).    [provider]  iron polysaccharides (NIFEREX) 150 MG capsule Take 1 capsule (150 mg total) by mouth 2 (two) times daily. Patient not taking: Reported on 07/03/2020 06/24/20   Barton Dubois, MD  isosorbide mononitrate (IMDUR) 120 MG 24 hr tablet Take 1 tablet (120 mg total) by mouth daily. 04/08/20   Regalado, Belkys A, MD  Lidocaine 4 % PTCH Apply 1 patch topically daily. Apply 1 patch to both stumps once daily, 12 hour use    [provider]  loratadine (CLARITIN) 10 MG tablet Take 10 mg by mouth daily.    [provider]  Melatonin 5 MG TABS Take 1 tablet by mouth at bedtime.    [provider]  pantoprazole (PROTONIX) 40 MG tablet Take 1 tablet (40 mg total) by mouth 2 (two) times daily. 06/24/20   Barton Dubois, MD  polyethylene glycol (MIRALAX / GLYCOLAX) 17 g packet Take 17 g by mouth daily.    [provider]  polyethylene glycol (MIRALAX) 17 g packet Take 17 g by mouth daily. 07/04/20   Tacy Learn, PA-C  rosuvastatin (CRESTOR) 20 MG tablet Take 1 tablet (20 mg total) by mouth daily. Patient  taking differently: Take 20 mg by mouth at bedtime. 04/08/20   Regalado, Belkys A, MD  senna (SENOKOT) 8.6 MG tablet Take 1 tablet by mouth 2 (two) times daily.    [provider]  tamsulosin (FLOMAX) 0.4 MG CAPS capsule Take 0.4 mg by mouth daily.    [provider]  vitamin B-12 (CYANOCOBALAMIN) 500 MCG tablet Take 500 mcg by mouth daily.     [provider]    Allergies    No known allergies  Review of Systems   Review of Systems  Respiratory: Positive for cough. Negative for shortness of breath.   Musculoskeletal: Positive for back pain (chronic).  Neurological: Positive for weakness (generalized). Negative for facial asymmetry and headaches.  Psychiatric/Behavioral: Positive for confusion.  All other systems reviewed and are negative.   Physical Exam Updated Vital Signs BP (!) 144/74   Pulse 64   Resp 15   Ht 6\' 1"  (1.854 m)   Wt 113.9 kg   SpO2 100%   BMI 33.12 kg/m   Physical Exam Vitals and nursing note reviewed.  Constitutional:      Appearance: He is obese. He is not ill-appearing or diaphoretic.  HENT:     Head: Normocephalic and atraumatic.  Eyes:     Conjunctiva/sclera: Conjunctivae normal.  Cardiovascular:     Rate and Rhythm: Normal rate and regular rhythm.     Pulses: Normal pulses.  Pulmonary:     Effort: Pulmonary effort is normal.     Breath sounds: Rales present. No wheezing or rhonchi.     Comments: Actively coughing in the room. Speaking in full sentences between coughing spells.  Abdominal:     Palpations: Abdomen is soft.     Tenderness: There is no abdominal tenderness. There  is no guarding or rebound.  Musculoskeletal:     Cervical back: Neck supple.     Comments: Bilateral BKA  Skin:    General: Skin is warm and dry.  Neurological:     General: No focal deficit present.     Mental Status: He is alert and oriented to person, place, and time.     Cranial Nerves: No cranial nerve deficit.     Motor: No weakness.      ED Results / Procedures / Treatments   Labs (all labs ordered are listed, but only abnormal results are displayed) Labs Reviewed  COMPREHENSIVE METABOLIC PANEL - Abnormal; Notable for the following components:      Result Value   Sodium 130 (*)    Chloride 93 (*)    CO2 34 (*)    Glucose, Bld 247 (*)    Creatinine, Ser 1.59 (*)    Calcium 8.4 (*)    Total Protein 9.9 (*)    Albumin 2.7 (*)    GFR, Estimated 47 (*)    Anion gap 3 (*)    All other components within normal limits  CBC WITH DIFFERENTIAL/PLATELET - Abnormal; Notable for the following components:   WBC 3.4 (*)    RBC 2.93 (*)    Hemoglobin 8.9 (*)    HCT 28.5 (*)    All other components within normal limits  URINALYSIS, ROUTINE W REFLEX MICROSCOPIC - Abnormal; Notable for the following components:   Protein, ur 30 (*)    Leukocytes,Ua SMALL (*)    Bacteria, UA RARE (*)    All other components within normal limits  BRAIN NATRIURETIC PEPTIDE - Abnormal; Notable for the following components:   B Natriuretic Peptide 326.0 (*)    All other components within normal limits  CBG MONITORING, ED - Abnormal; Notable for the following components:   Glucose-Capillary 210 (*)    All other components within normal limits  TROPONIN I (HIGH SENSITIVITY) - Abnormal; Notable for the following components:   Troponin I (High Sensitivity) 19 (*)    All other components within normal limits  URINE CULTURE  TROPONIN I (HIGH SENSITIVITY)    EKG EKG Interpretation  Date/Time:  Friday Jul 07 2020 11:52:21 EDT Ventricular Rate:  68 PR Interval:    QRS Duration: 91 QT Interval:  409 QTC Calculation: 435 R Axis:   68 Text Interpretation: Sinus rhythm Probable inferior infarct, old Lateral leads are also involved since last tracing no significant change Confirmed by Daleen Bo [phone]) on 07/07/2020 2:48:59 PM   Radiology CT Head Wo Contrast  Result Date: 07/07/2020 CLINICAL DATA:  Mental status change EXAM: CT HEAD  WITHOUT CONTRAST TECHNIQUE: Contiguous axial images were obtained from the base of the skull through the vertex without intravenous contrast. COMPARISON:  CT head 11/23/2013 FINDINGS: Brain: No evidence of acute infarction, hemorrhage, hydrocephalus, extra-axial collection or mass lesion/mass effect. Vascular: No hyperdense vessel or unexpected calcification. Skull: Normal. Negative for fracture or focal lesion. Sinuses/Orbits: Complete opacification of the left middle ear cavity and mastoid air cells. Near complete opacification of the sphenoid sinuses. Partial opacification of the maxillary sinuses. Partial opacification of the left frontal and ethmoid air cells. Deformity of the medial wall of the left orbit consistent with remote fracture. Other: None. IMPRESSION: 1. No acute intracranial abnormality. 2. Complete opacification of the left mastoid air cells and middle ear cavity. 3. Multi sinus opacification. Electronically Signed   By: Miachel Roux M.D.   On: 07/07/2020 14:55  DG Chest Port 1 View  Result Date: 07/07/2020 CLINICAL DATA:  Cough, altered mental status EXAM: PORTABLE CHEST 1 VIEW COMPARISON:  07/03/2020 radiograph, 11/26/2015 CT FINDINGS: Low volumes and bibasilar streaky atelectasis with vascular crowding. More patchy opacity present in the left lung base could reflect atelectasis and mediastinal fat though underlying airspace disease is not fully excluded. No pneumothorax. No layering effusion. Prominent cardiomediastinal contours may be related to portable technique. The aorta is calcified. The remaining cardiomediastinal contours are unremarkable. No acute osseous or soft tissue abnormality. Degenerative changes are present in the imaged spine and shoulders. Remote appearing posttraumatic deformity of the distal head right clavicle. Telemetry leads overlie the chest. IMPRESSION: Low volumes and atelectasis. Patchy opacity in the left lung base may reflect further volume loss, mediastinal  fat, or early airspace disease. Electronically Signed   By: Lovena Le M.D.   On: 07/07/2020 14:18    Procedures Procedures   Medications Ordered in ED Medications  furosemide (LASIX) injection 40 mg (40 mg Intravenous Given 07/07/20 1459)  cefTRIAXone (ROCEPHIN) 1 g in sodium chloride 0.9 % 100 mL IVPB (0 g Intravenous Stopped 07/07/20 1708)  HYDROcodone-acetaminophen (NORCO/VICODIN) 5-325 MG per tablet 1 tablet (1 tablet Oral Given 07/07/20 1708)    ED Course  I have reviewed the triage vital signs and the nursing notes.  Pertinent labs & imaging results that were available during my care of the patient were reviewed by me and considered in my medical decision making (see chart for details).  Clinical Course as of 07/07/20 1733  Fri Jul 07, 2020  1410 Temp: 98.2 F (36.8 C) [MV]    Clinical Course User Index [MV] Eustaquio Maize, PA-C   MDM Rules/Calculators/A&P                          70 year old male who presents to the ED today from [facility] for altered mental status.  Per staff at facility he was unable to recognize any of the staff there.  Patient is currently complaining of a cough for the past 2 weeks.  Staff is unable to assess whether he has truly been coughing as she states that she has not been working there for more than 2 weeks.  He also complains of chronic back pain.  On arrival to the ED patient is nontachycardic, nontachypneic, satting 100% on his typical 3 L O2.  He is alert and oriented x4.  He has no focal neurodeficits on exam today however will work-up for altered mental status as well as cough.  Patient with history of CHF, will add on a BNP and troponin at this time.  Patient does have some left-sided chest wall pain with coughing that he was seen for several days ago with a negative work-up and negative troponins.  He was also seen for abdominal pain a couple of days ago which seems to be chronic, negative work-up.  If no acute findings on work-up will discharge  back to facility.   EKG with no new changes CBC without leukocytosis. Hgb stable at 8.9.  CMP with sodium 130, chloride 93, bicarb 34. No significant change compared to previous. Given hx of CHF do not want to fluid overload with IV fluids for hyponatremia. Will encourage oral intake. Creatinine stable at 1.59 and glucose 247. No gap.  Trop 17 (downtrending from 26 4 days ago)  CXR: IMPRESSION:  Low volumes and atelectasis. Patchy opacity in the left lung base  may reflect  further volume loss, mediastinal fat, or early airspace  disease.   CT Head: IMPRESSION:  1. No acute intracranial abnormality.  2. Complete opacification of the left mastoid air cells and middle  ear cavity.  3. Multi sinus opacification.   BNP elevated at 326; will provide IV lasix at this time. Still awaiting U/A. At this time. Pt will need abx for sinus infection. Will provide ENT follow up.   U/A with small leuks, 11-20 WBCs, and rare bacteria. Will obtain culture and treat with rocephin at this time. Will likely discharge with Augmentin to cover for both UTI and sinusitis today. Will also cover for questionable early airsapce disease on CXR. Question if this is the cause of pt's AMS this AM at his facility. Remainder of workup reassuring.   Repeat troponin 19. Delta change of 2. Pt without complaints of chest pain. Will discharge home at this time.   This note was prepared using Dragon voice recognition software and may include unintentional dictation errors due to the inherent limitations of voice recognition software.  Final Clinical Impression(s) / ED Diagnoses Final diagnoses:  Lower urinary tract infectious disease  Acute sinusitis, recurrence not specified, unspecified location  Cough  Confusion    Rx / DC Orders ED Discharge Orders         Ordered    amoxicillin-clavulanate (AUGMENTIN) 500-125 MG tablet  2 times daily        07/07/20 1731           Discharge Instructions     Please take  antibiotics as prescribed to cover for a sinus infection as well as a mild urinary tract infection. We have sent your urine for culture and will call in 2-3 days if the antibiotic needs to be changed.   Follow up with ENT for further evaluation of your sinus infection   Return to the ED for any new/worsening symptoms        Eustaquio Maize, PA-C 07/07/20 1733    Daleen Bo, MD 07/08/20 1159

## 2020-07-07 NOTE — ED Provider Notes (Signed)
  Face-to-face evaluation   History: He presents for evaluation of confusion.  He was more confused than usual.  He also states he has back pain and recently had constipation treated various ways with some improvement.  He states he always has back pain and request pain medication.  Physical exam: Obese, alert and cooperative.  He has bilateral leg amputations.  He is lucid.  Medical screening examination/treatment/procedure(s) were conducted as a shared visit with non-physician practitioner(s) and myself.  I personally evaluated the patient during the encounter    Daleen Bo, MD 07/08/20 1159

## 2020-07-07 NOTE — Discharge Instructions (Addendum)
Please take antibiotics as prescribed to cover for a sinus infection as well as a mild urinary tract infection. We have sent your urine for culture and will call in 2-3 days if the antibiotic needs to be changed.   Follow up with ENT for further evaluation of your sinus infection   Return to the ED for any new/worsening symptoms

## 2020-07-10 LAB — URINE CULTURE: Culture: >=100000 — AB

## 2020-07-11 ENCOUNTER — Telehealth: Payer: Self-pay | Admitting: *Deleted

## 2020-07-11 NOTE — Telephone Encounter (Signed)
Post ED Visit - Positive Culture Follow-up  Culture report reviewed by antimicrobial stewardship pharmacist: [facility] Team []  Elenor Quinones, Pharm.D. []  Heide Guile, Pharm.D., BCPS AQ-ID []  Parks Neptune, Pharm.D., BCPS []  Alycia Rossetti, Pharm.D., BCPS []  [place], Pharm.D., BCPS, AAHIVP []  Legrand Como, Pharm.D., BCPS, AAHIVP []  Salome Arnt, PharmD, BCPS []  Johnnette Gourd, PharmD, BCPS []  Hughes Better, PharmD, BCPS []  Leeroy Cha, PharmD []  Laqueta Linden, PharmD, BCPS []  Albertina Parr, PharmD  [facility] Team []  Leodis Sias, PharmD []  Lindell Spar, PharmD []  Royetta Asal, PharmD []  Graylin Shiver, Rph []  Rema Fendt) Glennon Mac, PharmD []  Arlyn Dunning, PharmD []  Netta Cedars, PharmD []  Dia Sitter, PharmD []  Leone Haven, PharmD []  Gretta Arab, PharmD []  Theodis Shove, PharmD []  Peggyann Juba, PharmD []  Reuel Boom, PharmD   Positive urine culture Treated with Amoxicillin-Pot, organism sensitive to the same and no further patient follow-up is required at this time.  Joetta Manners, PharmD  Harlon Flor Talley 07/11/2020, 11:05 AM

## 2020-07-20 NOTE — Progress Notes (Signed)
[address] [place], [place] [address]   CLINIC:  Medical Oncology/Hematology  PCP:  Iona Beard, [address] / [place] [address]  [phone]  REASON FOR VISIT:  Follow-up for low hemoglobin  PRIOR THERAPY: none  CURRENT THERAPY: under work-up  INTERVAL HISTORY:  Cory Weber, a 70 y.o. male, returns for routine follow-up for his low hemoglobin. Cory Weber was last seen on 06/13/2020.  Today he reports feeling well. He reports shaking and tremors in his hands.   REVIEW OF SYSTEMS:  Review of Systems  Constitutional:  Positive for fatigue (depleted). Negative for appetite change.  HENT:   Positive for sore throat.   Respiratory:  Positive for cough and shortness of breath.   Musculoskeletal:  Positive for flank pain (left 6/10).  All other systems reviewed and are negative.  PAST MEDICAL/SURGICAL HISTORY:  Past Medical History:  Diagnosis Date   Acute anemia 06/01/2020   Anemia    Asthma    BPH (benign prostatic hyperplasia)    CAD (coronary artery disease)    Severe multivessel disease 03/2020 - poor revascularization options   Chronic diastolic heart failure (HCC)    CKD (chronic kidney disease) stage 3, GFR 30-59 ml/min (HCC)    COPD (chronic obstructive pulmonary disease) (HCC)    CVA (cerebral vascular accident) ([place]) 2015   Essential hypertension    Gangrene ([facility] [facility]) 06/10/2016   GERD (gastroesophageal reflux disease)    Hepatitis C    History of pneumonia    Hyperlipidemia    Osteomyelitis (HCC)    PAD (peripheral artery disease) (HCC)    Bilateral BKA   Peripheral neuropathy    Type II diabetes mellitus ([place] [facility])    Past Surgical History:  Procedure Laterality Date   ABDOMINAL AORTOGRAM N/A 06/03/2016   Procedure: Abdominal Aortogram;  Surgeon: Waynetta Sandy, MD;  Location: [facility];  Service: Cardiovascular;  Laterality: N/A;   AMPUTATION Left 08/16/2015   Procedure: LEFT BELOW THE KNEE  AMPUTATION ;  Surgeon: Serafina Mitchell, MD;  Location: [facility];  Service: Vascular;  Laterality: Left;   AMPUTATION Right 08/22/2016   Procedure: AMPUTATION BELOW KNEE-RIGHT;  Surgeon: Waynetta Sandy, MD;  Location: [facility];  Service: Vascular;  Laterality: Right;   BACK SURGERY     BELOW KNEE LEG AMPUTATION Left 08/16/2015   BIOPSY N/A 09/03/2012   Procedure: BIOPSY;  Surgeon: Daneil Dolin, MD;  [facility];  Service: Endoscopy;  Laterality: N/A;  gastric and gastric mucosa   BIOPSY N/A 12/03/2012   Procedure: BIOPSY;  Surgeon: Daneil Dolin, MD;  [facility];  Service: Endoscopy;  Laterality: N/A;   BIOPSY  03/31/2020   Procedure: BIOPSY;  Surgeon: Ladene Artist, MD;  Location: [facility];  Service: Endoscopy;;   COLONOSCOPY WITH PROPOFOL N/A 09/03/2012   OYD:XAJOINO polyp-removed as outlined above. Prominent internal hemorrhoids. Tubular adenoma   COLONOSCOPY WITH PROPOFOL N/A 04/05/2020   Procedure: COLONOSCOPY WITH PROPOFOL;  Surgeon: Lavena Bullion, DO;  Location: [facility];  Service: Gastroenterology;  Laterality: N/A;   ENTEROSCOPY Left 04/05/2020   Procedure: ENTEROSCOPY;  Surgeon: Lavena Bullion, DO;  Location: [facility];  Service: Gastroenterology;  Laterality: Left;   ENTEROSCOPY N/A 04/07/2020   Procedure: ENTEROSCOPY;  Surgeon: Lavena Bullion, DO;  Location: [facility];  Service: Gastroenterology;  Laterality: N/A;   ESOPHAGOGASTRODUODENOSCOPY (EGD) WITH PROPOFOL N/A 09/03/2012   MVE:HMCNOB hernia. Gastric diverticulum. Gastric ulcers  with associated erosions. Duodenal erosions. Status post gastric biopsy. H.PYLORI gastritis    ESOPHAGOGASTRODUODENOSCOPY (EGD) WITH PROPOFOL N/A 12/03/2012   Dr. Gala Romney: gastric diverticulum, gastric erosions and scar. Previously noted gastric ulcer completed healed. Biopsy without H.pylori.    ESOPHAGOGASTRODUODENOSCOPY (EGD) WITH PROPOFOL N/A 01/23/2016   Procedure: ESOPHAGOGASTRODUODENOSCOPY (EGD) WITH  PROPOFOL;  Surgeon: Doran Stabler, MD;  [facility];  Service: Gastroenterology;  Laterality: N/A;   ESOPHAGOGASTRODUODENOSCOPY (EGD) WITH PROPOFOL N/A 03/31/2020   Procedure: ESOPHAGOGASTRODUODENOSCOPY (EGD) WITH PROPOFOL;  Surgeon: Ladene Artist, MD;  Location: [facility];  Service: Endoscopy;  Laterality: N/A;   ESOPHAGOGASTRODUODENOSCOPY (EGD) WITH PROPOFOL N/A 06/02/2020   Procedure: ESOPHAGOGASTRODUODENOSCOPY (EGD) WITH PROPOFOL;  Surgeon: Eloise Harman, DO;  [facility];  Service: Endoscopy;  Laterality: N/A;   GIVENS CAPSULE STUDY N/A 04/05/2020   Procedure: GIVENS CAPSULE STUDY;  Surgeon: Lavena Bullion, DO;  Location: [facility];  Service: Gastroenterology;  Laterality: N/A;   HOT HEMOSTASIS N/A 04/07/2020   Procedure: HOT HEMOSTASIS (ARGON PLASMA COAGULATION/BICAP);  Surgeon: Lavena Bullion, DO;  Location: [facility]COPY;  Service: Gastroenterology;  Laterality: N/A;   I & D EXTREMITY Right 07/11/2016   Procedure: DELAY PRIMARY CLOSURE FOOT AMPUTATION;  Surgeon: Edrick Kins, DPM;  Location: [facility];  Service: Podiatry;  Laterality: Right;   LEFT HEART CATH AND CORONARY ANGIOGRAPHY N/A 03/21/2020   Procedure: LEFT HEART CATH AND CORONARY ANGIOGRAPHY;  Surgeon: Martinique, Peter M, MD;  Location: [facility];  Service: Cardiovascular;  Laterality: N/A;   LIVER BIOPSY  2005   Done in [place], [place]. Chronic hepatitis with mild periportal inflammation, lobular unicellular necrosis and portal fibrosis. Grade 2, stage 1-2.   LOWER EXTREMITY ANGIOGRAPHY Right 06/03/2016   Procedure: Lower Extremity Angiography;  Surgeon: Waynetta Sandy, MD;  Location: [facility];  Service: Cardiovascular;  Laterality: Right;   MAXIMUM ACCESS (MAS)POSTERIOR LUMBAR INTERBODY FUSION (PLIF) 1 LEVEL N/A 11/25/2013   Procedure: FOR MAXIMUM ACCESS (MAS) POSTERIOR LUMBAR INTERBODY FUSION (PLIF) 1 LEVEL;  Surgeon: Eustace Moore, MD;  Location: [facility];   Service: Neurosurgery;  Laterality: N/A;  FOR MAXIMUM ACCESS (MAS) POSTERIOR LUMBAR INTERBODY FUSION (PLIF) 1 LEVEL LUMBAR 3-4   PERIPHERAL VASCULAR ATHERECTOMY Right 06/03/2016   Procedure: Peripheral Vascular Atherectomy;  Surgeon: Waynetta Sandy, MD;  Location: [facility];  Service: Cardiovascular;  Laterality: Right;  SFA WITH STENT   PERIPHERAL VASCULAR CATHETERIZATION Left 08/01/2015   Procedure: Lower Extremity Angiography;  Surgeon: Serafina Mitchell, MD;  Location: [facility];  Service: Cardiovascular;  Laterality: Left;   PERIPHERAL VASCULAR CATHETERIZATION N/A 08/01/2015   Procedure: Abdominal Aortogram;  Surgeon: Serafina Mitchell, MD;  Location: [facility];  Service: Cardiovascular;  Laterality: N/A;   PERIPHERAL VASCULAR CATHETERIZATION N/A 08/08/2015   Procedure: Abdominal Aortogram w/Lower Extremity;  Surgeon: Serafina Mitchell, MD;  Location: [facility];  Service: Cardiovascular;  Laterality: N/A;   PERIPHERAL VASCULAR CATHETERIZATION Left 08/08/2015   Procedure: Peripheral Vascular Balloon Angioplasty;  Surgeon: Serafina Mitchell, MD;  Location: [facility];  Service: Cardiovascular;  Laterality: Left;  left popiteal artery, left peronealtrunk, left post tibial   POLYPECTOMY N/A 09/03/2012   Procedure: POLYPECTOMY;  Surgeon: Daneil Dolin, MD;  [facility];  Service: Endoscopy;  Laterality: N/A;  cecal polyp   SUBMUCOSAL TATTOO INJECTION  04/07/2020   Procedure: SUBMUCOSAL TATTOO INJECTION;  Surgeon: Lavena Bullion, DO;  Location:  [facility];  Service: Gastroenterology;;   TONSILLECTOMY     TRANSMETATARSAL AMPUTATION Right 06/13/2016   Procedure: RIGHT TRANSMETATARSAL AMPUTATION;  Surgeon: Waynetta Sandy, MD;  Location: [facility];  Service: Vascular;  Laterality: Right;   TRANSMETATARSAL AMPUTATION Right 07/10/2016   Procedure: REVISION RIGHT TRANSMETATARSAL AMPUTATION;  Surgeon: Waynetta Sandy, MD;  Location: [facility];   Service: Vascular;  Laterality: Right;    SOCIAL HISTORY:  Social History   Socioeconomic History   Marital status: Divorced    Spouse name: Not on file   Number of children: 1   Years of education: Not on file   Highest education level: Not on file  Occupational History   Occupation: disabled  Tobacco Use   Smoking status: Former    Packs/day: 1.00    Years: 47.00    Pack years: 47.00    Types: Cigarettes    Quit date: 07/13/2015    Years since quitting: 5.0   Smokeless tobacco: Never  Vaping Use   Vaping Use: Never used  Substance and Sexual Activity   Alcohol use: No    Alcohol/week: 0.0 standard drinks    Comment:  none 08/15/2015 "stopped drinking 3-4 years ago"   Drug use: No    Comment: 08/15/2015 "last used cocaine 3-4 months ago"   Sexual activity: Not on file  Other Topics Concern   Not on file  Social History Narrative   Not on file   Social Determinants of Health   Financial Resource Strain: Not on file  Food Insecurity: Not on file  Transportation Needs: Not on file  Physical Activity: Not on file  Stress: Not on file  Social Connections: Not on file  Intimate Partner Violence: Not on file    FAMILY HISTORY:  Family History  Problem Relation Age of Onset   Breast cancer Mother    Heart failure Mother    Diabetes Father    Hypertension Father    Heart disease Father    Hyperlipidemia Father    Diabetes Sister    Hypertension Sister    Breast cancer Sister    Colon cancer Neg Hx    Liver disease Neg Hx     CURRENT MEDICATIONS:  Current Outpatient Medications  Medication Sig Dispense Refill   acetaminophen (TYLENOL) 325 MG tablet Take 2 tablets (650 mg total) by mouth every 6 (six) hours as needed for mild pain or headache (fever >/= 101). 12 tablet 0   aspirin EC 81 MG tablet Take 1 tablet (81 mg total) by mouth daily with breakfast. 30 tablet 2   baclofen (LIORESAL) 10 MG tablet Take 1 tablet (10 mg total) by mouth every 8 (eight) hours as  needed for muscle spasms. For shoulder spasm 10 tablet 0   carvedilol (COREG) 25 MG tablet Take 1 tablet (25 mg total) by mouth 2 (two) times daily with a meal. 60 tablet 3   cefTRIAXone (ROCEPHIN) 1 g injection Inject 1 g into the muscle daily. For 5 days last dose yesterday     cloNIDine (CATAPRES) 0.1 MG tablet Take 0.1 mg by mouth at bedtime.     clopidogrel (PLAVIX) 75 MG tablet Take 75 mg by mouth daily. (Patient not taking: Reported on 07/03/2020)     DULoxetine (CYMBALTA) 60 MG capsule Take 60 mg by mouth daily.     ferrous gluconate (FERGON) 324 MG tablet Take 324 mg by mouth daily with breakfast.     furosemide (LASIX) 20 MG tablet Take 80 mg  by mouth daily.     gabapentin (NEURONTIN) 300 MG capsule Take 300 mg by mouth 3 (three) times daily.     glycerin adult 2 g suppository Place 1 suppository rectally as needed for constipation. 12 suppository 0   hydrALAZINE (APRESOLINE) 50 MG tablet Take 1 tablet (50 mg total) by mouth daily.     HYDROcodone-acetaminophen (NORCO/VICODIN) 5-325 MG tablet Take 1 tablet by mouth 4 (four) times daily as needed.     insulin glargine (LANTUS) 100 UNIT/ML injection Inject 0.18 mLs (18 Units total) into the skin at bedtime.     insulin lispro (HUMALOG) 100 UNIT/ML injection Inject 0.05 mLs (5 Units total) into the skin in the morning, at noon, and at bedtime. Give only if eats 50% or more of meal. (Patient taking differently: Inject 8 Units into the skin in the morning, at noon, and at bedtime. Give only if eats 50% or more of meal.)     Insulin Lispro-aabc 200 UNIT/ML SOPN Inject 2-8 Units into the skin 3 (three) times daily with meals. And at bedtime. 200-250=2units 251-300=4units 301-350=6units 351-400=8units     ipratropium-albuterol (DUONEB) 0.5-2.5 (3) MG/3ML SOLN Take 3 mLs by nebulization every 6 (six) hours as needed (SOB/ wheezing).     iron polysaccharides (NIFEREX) 150 MG capsule Take 1 capsule (150 mg total) by mouth 2 (two) times daily. (Patient  not taking: Reported on 07/03/2020) 60 capsule 3   isosorbide mononitrate (IMDUR) 120 MG 24 hr tablet Take 1 tablet (120 mg total) by mouth daily. 30 tablet 3   Lidocaine 4 % PTCH Apply 1 patch topically daily. Apply 1 patch to both stumps once daily, 12 hour use     loratadine (CLARITIN) 10 MG tablet Take 10 mg by mouth daily.     Melatonin 5 MG TABS Take 1 tablet by mouth at bedtime.     pantoprazole (PROTONIX) 40 MG tablet Take 1 tablet (40 mg total) by mouth 2 (two) times daily. 60 tablet 1   polyethylene glycol (MIRALAX / GLYCOLAX) 17 g packet Take 17 g by mouth daily.     polyethylene glycol (MIRALAX) 17 g packet Take 17 g by mouth daily. 14 each 0   rosuvastatin (CRESTOR) 20 MG tablet Take 1 tablet (20 mg total) by mouth daily. (Patient taking differently: Take 20 mg by mouth at bedtime.) 30 tablet 2   senna (SENOKOT) 8.6 MG tablet Take 1 tablet by mouth 2 (two) times daily.     tamsulosin (FLOMAX) 0.4 MG CAPS capsule Take 0.4 mg by mouth daily.     vitamin B-12 (CYANOCOBALAMIN) 500 MCG tablet Take 500 mcg by mouth daily.      No current facility-administered medications for this visit.    ALLERGIES:  Allergies  Allergen Reactions   No Known Allergies     PHYSICAL EXAM:  Performance status (ECOG): 3 - Symptomatic, >50% confined to bed  There were no vitals filed for this visit. Wt Readings from Last 3 Encounters:  07/07/20 251 lb (113.9 kg)  07/04/20 250 lb (113.4 kg)  07/03/20 257 lb 11.5 oz (116.9 kg)   Physical Exam Vitals reviewed.  Constitutional:      Appearance: Normal appearance.  Cardiovascular:     Rate and Rhythm: Normal rate and regular rhythm.     Pulses: Normal pulses.     Heart sounds: Normal heart sounds.  Pulmonary:     Effort: Pulmonary effort is normal.     Breath sounds: Normal breath sounds.  Neurological:  General: No focal deficit present.     Mental Status: He is alert and oriented to person, place, and time.  Psychiatric:        Mood and  Affect: Mood normal.        Behavior: Behavior normal.    LABORATORY DATA:  I have reviewed the labs as listed.  CBC Latest Ref Rng & Units 07/07/2020 07/04/2020 07/03/2020  WBC 4.0 - 10.5 K/uL 3.4(L) 3.5(L) 3.5(L)  Hemoglobin 13.0 - 17.0 g/dL 8.9(L) 8.4(L) 9.2(L)  Hematocrit 39.0 - 52.0 % 28.5(L) 27.2(L) 29.4(L)  Platelets 150 - 400 K/uL 219 205 191   CMP Latest Ref Rng & Units 07/07/2020 07/04/2020 07/03/2020  Glucose 70 - 99 mg/dL 247(H) 202(H) 185(H)  BUN 8 - 23 mg/dL 18 18 11   Creatinine 0.61 - 1.24 mg/dL 1.59(H) 1.75(H) 1.26(H)  Sodium 135 - 145 mmol/L 130(L) 133(L) 132(L)  Potassium 3.5 - 5.1 mmol/L 4.2 4.4 4.6  Chloride 98 - 111 mmol/L 93(L) 96(L) 94(L)  CO2 22 - 32 mmol/L 34(H) 34(H) 34(H)  Calcium 8.9 - 10.3 mg/dL 8.4(L) 8.6(L) 8.7(L)  Total Protein 6.5 - 8.1 g/dL 9.9(H) 9.4(H) -  Total Bilirubin 0.3 - 1.2 mg/dL 0.4 0.3 -  Alkaline Phos 38 - 126 U/L 59 55 -  AST 15 - 41 U/L 15 12(L) -  ALT 0 - 44 U/L 12 11 -      Component Value Date/Time   RBC 2.93 (L) 07/07/2020 1343   MCV 97.3 07/07/2020 1343   MCV 82.8 10/31/2011 1319   MCH 30.4 07/07/2020 1343   MCHC 31.2 07/07/2020 1343   RDW 13.9 07/07/2020 1343   LYMPHSABS 1.0 07/07/2020 1343   MONOABS 0.2 07/07/2020 1343   EOSABS 0.1 07/07/2020 1343   BASOSABS 0.0 07/07/2020 1343    DIAGNOSTIC IMAGING:  I have independently reviewed the scans and discussed with the patient. CT Head Wo Contrast  Result Date: 07/07/2020 CLINICAL DATA:  Mental status change EXAM: CT HEAD WITHOUT CONTRAST TECHNIQUE: Contiguous axial images were obtained from the base of the skull through the vertex without intravenous contrast. COMPARISON:  CT head 11/23/2013 FINDINGS: Brain: No evidence of acute infarction, hemorrhage, hydrocephalus, extra-axial collection or mass lesion/mass effect. Vascular: No hyperdense vessel or unexpected calcification. Skull: Normal. Negative for fracture or focal lesion. Sinuses/Orbits: Complete opacification of the left  middle ear cavity and mastoid air cells. Near complete opacification of the sphenoid sinuses. Partial opacification of the maxillary sinuses. Partial opacification of the left frontal and ethmoid air cells. Deformity of the medial wall of the left orbit consistent with remote fracture. Other: None. IMPRESSION: 1. No acute intracranial abnormality. 2. Complete opacification of the left mastoid air cells and middle ear cavity. 3. Multi sinus opacification. Electronically Signed   By: Miachel Roux M.D.   On: 07/07/2020 14:55   DG Chest Port 1 View  Result Date: 07/07/2020 CLINICAL DATA:  Cough, altered mental status EXAM: PORTABLE CHEST 1 VIEW COMPARISON:  07/03/2020 radiograph, 11/26/2015 CT FINDINGS: Low volumes and bibasilar streaky atelectasis with vascular crowding. More patchy opacity present in the left lung base could reflect atelectasis and mediastinal fat though underlying airspace disease is not fully excluded. No pneumothorax. No layering effusion. Prominent cardiomediastinal contours may be related to portable technique. The aorta is calcified. The remaining cardiomediastinal contours are unremarkable. No acute osseous or soft tissue abnormality. Degenerative changes are present in the imaged spine and shoulders. Remote appearing posttraumatic deformity of the distal head right clavicle. Telemetry leads overlie the  chest. IMPRESSION: Low volumes and atelectasis. Patchy opacity in the left lung base may reflect further volume loss, mediastinal fat, or early airspace disease. Electronically Signed   By: Lovena Le M.D.   On: 07/07/2020 14:18   DG Chest Port 1 View  Result Date: 07/03/2020 CLINICAL DATA:  70 year old male with left side chest pain. EXAM: PORTABLE CHEST 1 VIEW COMPARISON:  Chest radiographs 06/13/2020 and earlier. FINDINGS: Portable AP upright view at 0656 hours. Stable lung volumes and mediastinal contours. Heart size at the upper limits of normal. Visualized tracheal air column is  within normal limits. No pneumothorax or pleural effusion. Allowing for portable technique the lungs are clear. Chronic degenerative and/or posttraumatic osteophytosis about the right shoulder. No acute osseous abnormality identified. IMPRESSION: No acute cardiopulmonary abnormality. Electronically Signed   By: Genevie Ann M.D.   On: 07/03/2020 07:25     ASSESSMENT:  1.  Normocytic anemia: - He was evaluated for normocytic anemia with hemoglobin ranging between 8-9. - EGD on 06/02/2020 with gastritis, gastric diverticulum, normal duodenal bulb up to the second part. - Capsule endoscopy on 04/07/2020 with medium sized nonbleeding AVM in the proximal small bowel.  This could certainly be the culprit for recent acute on chronic anemia and FOBT positive stools in the setting of antiplatelet therapy. - Small bowel enteroscopy on 04/07/2020 with normal esophagus, mild antral and fundus gastritis.  2 small nonbleeding angiectasis in the duodenum, treated with APC.  A single medium sized nonbleeding angiectasia in the jejunum treated with APC.   2.  Social/family history: - He currently resides at [facility]. - He had bilateral BKA in 2017 and 2018 from peripheral vascular disease from diabetes. - He did Architect work.  He smoked 1 pack/day for more than 30 years and quit recently. - Mother died of breast cancer.  No family history of leukemias or myeloma.   PLAN:  1.  Normocytic anemia: -likely etiology is blood loss anemia with combination of CKD.  Infiltrative disease from myeloma is also another consideration. - Reviewed his labs which showed ferritin 96 and percent saturation 11. - CBC on 07/21/2020 shows hemoglobin 8 and MCV of 97.  Other nutritional deficiency work-up was negative. - Reviewed ultrasound abdomen from 07/21/2020 which showed possible fatty infiltration of the liver.  Spleen could not be visualized properly. - I have recommended Feraheme weekly x2 for his anemia. - We  discussed side effects of Feraheme in detail including rare chance of allergic reactions.  2.  IgG kappa monoclonal gammopathy: - M spike on 06/13/2020 showed 4 g.  Immunofixation showed IgG kappa.  Kappa light chains were slightly elevated.  We talked about monoclonal gammopathy and multiple myeloma in general. - I have recommended bone marrow aspiration and biopsy for evaluation of myeloma.  We will send myeloma FISH and cytogenetics. - We will also order PET CT scan of the whole body to evaluate for bone lesions as well as solitary plasmacytoma. - RTC 2 weeks after biopsy.  Orders placed this encounter:  No orders of the defined types were placed in this encounter.    Derek Jack, MD [place] [phone]   I, Thana Ates, am acting as a scribe for Dr. Derek Jack.  I, Derek Jack MD, have reviewed the above documentation for accuracy and completeness, and I agree with the above.

## 2020-07-21 ENCOUNTER — Emergency Department (HOSPITAL_COMMUNITY): Payer: Medicare (Managed Care)

## 2020-07-21 ENCOUNTER — Encounter (HOSPITAL_COMMUNITY): Payer: Self-pay

## 2020-07-21 ENCOUNTER — Emergency Department (HOSPITAL_COMMUNITY)
Admission: EM | Admit: 2020-07-21 | Discharge: 2020-07-21 | Disposition: A | Discharge status: Skilled Nursing Facility | Payer: Medicare (Managed Care) | Attending: Emergency Medicine | Admitting: Emergency Medicine

## 2020-07-21 ENCOUNTER — Other Ambulatory Visit: Payer: Self-pay

## 2020-07-21 DIAGNOSIS — Z87891 Personal history of nicotine dependence: Secondary | ICD-10-CM | POA: Insufficient documentation

## 2020-07-21 DIAGNOSIS — Z794 Long term (current) use of insulin: Secondary | ICD-10-CM | POA: Diagnosis not present

## 2020-07-21 DIAGNOSIS — Z79899 Other long term (current) drug therapy: Secondary | ICD-10-CM | POA: Diagnosis not present

## 2020-07-21 DIAGNOSIS — I2510 Atherosclerotic heart disease of native coronary artery without angina pectoris: Secondary | ICD-10-CM | POA: Insufficient documentation

## 2020-07-21 DIAGNOSIS — J45909 Unspecified asthma, uncomplicated: Secondary | ICD-10-CM | POA: Insufficient documentation

## 2020-07-21 DIAGNOSIS — I5043 Acute on chronic combined systolic (congestive) and diastolic (congestive) heart failure: Secondary | ICD-10-CM | POA: Diagnosis not present

## 2020-07-21 DIAGNOSIS — D649 Anemia, unspecified: Secondary | ICD-10-CM

## 2020-07-21 DIAGNOSIS — E8809 Other disorders of plasma-protein metabolism, not elsewhere classified: Secondary | ICD-10-CM | POA: Insufficient documentation

## 2020-07-21 DIAGNOSIS — E1122 Type 2 diabetes mellitus with diabetic chronic kidney disease: Secondary | ICD-10-CM | POA: Insufficient documentation

## 2020-07-21 DIAGNOSIS — R61 Generalized hyperhidrosis: Secondary | ICD-10-CM | POA: Diagnosis present

## 2020-07-21 DIAGNOSIS — E1140 Type 2 diabetes mellitus with diabetic neuropathy, unspecified: Secondary | ICD-10-CM | POA: Insufficient documentation

## 2020-07-21 DIAGNOSIS — E1121 Type 2 diabetes mellitus with diabetic nephropathy: Secondary | ICD-10-CM | POA: Diagnosis not present

## 2020-07-21 DIAGNOSIS — N183 Chronic kidney disease, stage 3 unspecified: Secondary | ICD-10-CM | POA: Insufficient documentation

## 2020-07-21 DIAGNOSIS — R1013 Epigastric pain: Secondary | ICD-10-CM

## 2020-07-21 DIAGNOSIS — I130 Hypertensive heart and chronic kidney disease with heart failure and stage 1 through stage 4 chronic kidney disease, or unspecified chronic kidney disease: Secondary | ICD-10-CM | POA: Insufficient documentation

## 2020-07-21 DIAGNOSIS — K829 Disease of gallbladder, unspecified: Secondary | ICD-10-CM | POA: Insufficient documentation

## 2020-07-21 DIAGNOSIS — J441 Chronic obstructive pulmonary disease with (acute) exacerbation: Secondary | ICD-10-CM | POA: Insufficient documentation

## 2020-07-21 DIAGNOSIS — Z7982 Long term (current) use of aspirin: Secondary | ICD-10-CM | POA: Diagnosis not present

## 2020-07-21 LAB — CBC WITH DIFFERENTIAL/PLATELET
Abs Immature Granulocytes: 0.04 10*3/uL (ref 0.00–0.07)
Basophils Absolute: 0 10*3/uL (ref 0.0–0.1)
Basophils Relative: 0 %
Eosinophils Absolute: 0.1 10*3/uL (ref 0.0–0.5)
Eosinophils Relative: 3 %
HCT: 26.5 % — ABNORMAL LOW (ref 39.0–52.0)
Hemoglobin: 8 g/dL — ABNORMAL LOW (ref 13.0–17.0)
Immature Granulocytes: 1 %
Lymphocytes Relative: 30 %
Lymphs Abs: 1.4 10*3/uL (ref 0.7–4.0)
MCH: 29.5 pg (ref 26.0–34.0)
MCHC: 30.2 g/dL (ref 30.0–36.0)
MCV: 97.8 fL (ref 80.0–100.0)
Monocytes Absolute: 0.3 10*3/uL (ref 0.1–1.0)
Monocytes Relative: 7 %
Neutro Abs: 2.8 10*3/uL (ref 1.7–7.7)
Neutrophils Relative %: 59 %
Platelets: 206 10*3/uL (ref 150–400)
RBC: 2.71 MIL/uL — ABNORMAL LOW (ref 4.22–5.81)
RDW: 14.4 % (ref 11.5–15.5)
WBC: 4.8 10*3/uL (ref 4.0–10.5)
nRBC: 0 % (ref 0.0–0.2)

## 2020-07-21 LAB — COMPREHENSIVE METABOLIC PANEL
ALT: 13 U/L (ref 0–44)
AST: 14 U/L — ABNORMAL LOW (ref 15–41)
Albumin: 2.8 g/dL — ABNORMAL LOW (ref 3.5–5.0)
Alkaline Phosphatase: 48 U/L (ref 38–126)
Anion gap: 4 — ABNORMAL LOW (ref 5–15)
BUN: 25 mg/dL — ABNORMAL HIGH (ref 8–23)
CO2: 33 mmol/L — ABNORMAL HIGH (ref 22–32)
Calcium: 8.9 mg/dL (ref 8.9–10.3)
Chloride: 96 mmol/L — ABNORMAL LOW (ref 98–111)
Creatinine, Ser: 1.8 mg/dL — ABNORMAL HIGH (ref 0.61–1.24)
GFR, Estimated: 41 mL/min — ABNORMAL LOW (ref >60)
Glucose, Bld: 343 mg/dL — ABNORMAL HIGH (ref 70–99)
Potassium: 4 mmol/L (ref 3.5–5.1)
Sodium: 133 mmol/L — ABNORMAL LOW (ref 135–145)
Total Bilirubin: 0.2 mg/dL — ABNORMAL LOW (ref 0.3–1.2)
Total Protein: 10.4 g/dL — ABNORMAL HIGH (ref 6.5–8.1)

## 2020-07-21 LAB — LIPASE, BLOOD: Lipase: 19 U/L (ref 11–51)

## 2020-07-21 LAB — CBG MONITORING, ED: Glucose-Capillary: 313 mg/dL — ABNORMAL HIGH (ref 70–99)

## 2020-07-21 NOTE — Discharge Instructions (Signed)
The testing today was reassuring.  Your hemoglobin is low but does not require transfusion today.  Your doctor needs to follow this closely.  Also we recommend that you see a gastroenterologist regarding your gallbladder problem, hepatic steatosis and elevated level of protein.  These problems can be followed up in the next couple of weeks.  Call the GI doctor for a follow-up appointment.

## 2020-07-21 NOTE — ED Notes (Signed)
Patient yelling in room, this RN to room, patient stated" you need to do something, I'm tired of laying here" this RN explained to patient that the Doctor was very busy and was seeing other patient's and patient continued in a loud voice, "don't bullshit me, I can hear ya'll out there talking, you need to do something, I asked patient to lower his voice and to use the call bell when he needed something because it would disturb other patient's, patient stated " I can hear ya'll, I'm not going to quiet down, don't bullshit me, I want to see the doctor, tell him to come here", this RN sent MD Eulis Foster a message, will continue to monitor.

## 2020-07-21 NOTE — ED Provider Notes (Signed)
[facility] EMERGENCY DEPARTMENT Provider Note   CSN: [id number] Arrival date & time: 07/21/20  1028     History Chief Complaint  Patient presents with   Excessive Sweating    NOLON YELLIN is a 70 y.o. male.  HPI He is here to get checked out for recurrent blood loss, anemia, and states he needs an ultrasound.  He is concerned because he has been having chills and sweating, recently.  He states these problems have been going on for several weeks.  He denies cough, shortness of breath, focal weakness or paresthesia.  He lives in a nursing care facility.  He was recently treated for UTI.  There are no other known active modifying factors.    Past Medical History:  Diagnosis Date   Acute anemia 06/01/2020   Anemia    Asthma    BPH (benign prostatic hyperplasia)    CAD (coronary artery disease)    Severe multivessel disease 03/2020 - poor revascularization options   Chronic diastolic heart failure (HCC)    CKD (chronic kidney disease) stage 3, GFR 30-59 ml/min (HCC)    COPD (chronic obstructive pulmonary disease) (HCC)    CVA (cerebral vascular accident) ([place]) 2015   Essential hypertension    Gangrene ([place]) 06/10/2016   GERD (gastroesophageal reflux disease)    Hepatitis C    History of pneumonia    Hyperlipidemia    Osteomyelitis (HCC)    PAD (peripheral artery disease) (HCC)    Bilateral BKA   Peripheral neuropathy    Type II diabetes mellitus ([place])     Patient Active Problem List   Diagnosis Date Noted   Chronic respiratory failure with hypoxia ([place])    Symptomatic anemia 06/23/2020   COPD with acute exacerbation ([place]) 06/10/2020   Acute respiratory failure with hypoxia and hypercarbia ([place]) 06/10/2020   COPD (chronic obstructive pulmonary disease) (HCC)    Acute on chronic anemia 06/01/2020   Class 2 obesity due to excess calories with body mass index (BMI) of 35.0 to 35.9 in adult    Acute on chronic combined systolic and diastolic congestive heart failure  ([place]) 04/27/2020   AVM (arteriovenous malformation) of small bowel, acquired    Gastritis and gastroduodenitis    Gastric diverticulum    Grade I internal hemorrhoids    Occult blood in stools    Heart failure ([place]) 03/20/2020   NSTEMI (non-ST elevated myocardial infarction) ([place]) 03/20/2020   Pneumonia due to COVID-19 virus 02/24/2019   Acute on chronic respiratory failure with hypoxia ([place]) 02/20/2019   COVID-19 virus detected 02/20/2019   DNR (do not resuscitate) 02/20/2019   PAD (peripheral artery disease) ([place]) 08/22/2016   Osteomyelitis of foot ([place]) 07/08/2016   S/P transmetatarsal amputation of foot, right ([place]) 06/26/2016   Constipation due to opioid therapy 06/15/2016   Physical deconditioning 06/10/2016   Chronic diastolic CHF (congestive heart failure) ([place]) 06/10/2016   Ischemic pain of right foot 06/04/2016   Ischemia of foot 05/31/2016   Chronic gout due to renal impairment involving toe of right foot without tophus 02/05/2016   Type 2 diabetes with nephropathy ([place]) 01/21/2016   Uncontrolled type II diabetes with peripheral autonomic neuropathy ([place]) 01/17/2016   Edema of right lower extremity 12/20/2015   Cough 11/26/2015   GERD (gastroesophageal reflux disease) 10/08/2015   Amputation of left lower extremity below knee ([place]) 08/21/2015   Phantom limb pain (HCC)    Anemia of chronic disease    HLD (hyperlipidemia)    BPH (benign  prostatic hyperplasia)    ETOH abuse    PVD (peripheral vascular disease) (HCC)    Chronic hepatitis C without hepatic coma (HCC)    History of CVA (cerebrovascular accident) without residual deficits    Hyponatremia    Chronic kidney disease, stage 3 ([place]) 07/18/2015   Essential hypertension 07/18/2015   Acute blood loss anemia 12/01/2013   S/P lumbar spinal fusion 11/25/2013   Melena 12/03/2011   Hemiparesthesia 02/03/2011   Polysubstance abuse ([place]) 02/03/2011   Tobacco abuse 02/03/2011    Past Surgical History:  Procedure  Laterality Date   ABDOMINAL AORTOGRAM N/A 06/03/2016   Procedure: Abdominal Aortogram;  Surgeon: Waynetta Sandy, MD;  Location: [place] [facility];  Service: Cardiovascular;  Laterality: N/A;   AMPUTATION Left 08/16/2015   Procedure: LEFT BELOW THE KNEE AMPUTATION ;  Surgeon: Serafina Mitchell, MD;  Location: [place];  Service: Vascular;  Laterality: Left;   AMPUTATION Right 08/22/2016   Procedure: AMPUTATION BELOW KNEE-RIGHT;  Surgeon: Waynetta Sandy, MD;  Location: [place];  Service: Vascular;  Laterality: Right;   BACK SURGERY     BELOW KNEE LEG AMPUTATION Left 08/16/2015   BIOPSY N/A 09/03/2012   Procedure: BIOPSY;  Surgeon: Daneil Dolin, MD;  [facility];  Service: Endoscopy;  Laterality: N/A;  gastric and gastric mucosa   BIOPSY N/A 12/03/2012   Procedure: BIOPSY;  Surgeon: Daneil Dolin, MD;  [facility];  Service: Endoscopy;  Laterality: N/A;   BIOPSY  03/31/2020   Procedure: BIOPSY;  Surgeon: Ladene Artist, MD;  Location: [place];  Service: Endoscopy;;   COLONOSCOPY WITH PROPOFOL N/A 09/03/2012   [id number] polyp-removed as outlined above. Prominent internal hemorrhoids. Tubular adenoma   COLONOSCOPY WITH PROPOFOL N/A 04/05/2020   Procedure: COLONOSCOPY WITH PROPOFOL;  Surgeon: Lavena Bullion, DO;  Location: [place];  Service: Gastroenterology;  Laterality: N/A;   ENTEROSCOPY Left 04/05/2020   Procedure: ENTEROSCOPY;  Surgeon: Lavena Bullion, DO;  Location: [place];  Service: Gastroenterology;  Laterality: Left;   ENTEROSCOPY N/A 04/07/2020   Procedure: ENTEROSCOPY;  Surgeon: Lavena Bullion, DO;  Location: [place];  Service: Gastroenterology;  Laterality: N/A;   ESOPHAGOGASTRODUODENOSCOPY (EGD) WITH PROPOFOL N/A 09/03/2012   [id number] hernia. Gastric diverticulum. Gastric ulcers with associated erosions. Duodenal erosions. Status post gastric biopsy. H.PYLORI gastritis    ESOPHAGOGASTRODUODENOSCOPY (EGD) WITH PROPOFOL N/A  12/03/2012   Dr. Gala Romney: gastric diverticulum, gastric erosions and scar. Previously noted gastric ulcer completed healed. Biopsy without H.pylori.    ESOPHAGOGASTRODUODENOSCOPY (EGD) WITH PROPOFOL N/A 01/23/2016   Procedure: ESOPHAGOGASTRODUODENOSCOPY (EGD) WITH PROPOFOL;  Surgeon: Doran Stabler, MD;  [facility]: [facility];  Service: Gastroenterology;  Laterality: N/A;   ESOPHAGOGASTRODUODENOSCOPY (EGD) WITH PROPOFOL N/A 03/31/2020   Procedure: ESOPHAGOGASTRODUODENOSCOPY (EGD) WITH PROPOFOL;  Surgeon: Ladene Artist, MD;  Location: [facility];  Service: Endoscopy;  Laterality: N/A;   ESOPHAGOGASTRODUODENOSCOPY (EGD) WITH PROPOFOL N/A 06/02/2020   Procedure: ESOPHAGOGASTRODUODENOSCOPY (EGD) WITH PROPOFOL;  Surgeon: Eloise Harman, DO;  [facility];  Service: Endoscopy;  Laterality: N/A;   GIVENS CAPSULE STUDY N/A 04/05/2020   Procedure: GIVENS CAPSULE STUDY;  Surgeon: Lavena Bullion, DO;  Location: [place];  Service: Gastroenterology;  Laterality: N/A;   HOT HEMOSTASIS N/A 04/07/2020   Procedure: HOT HEMOSTASIS (ARGON PLASMA COAGULATION/BICAP);  Surgeon: Lavena Bullion, DO;  Location: [facility];  Service: Gastroenterology;  Laterality: N/A;   I & D EXTREMITY Right 07/11/2016   Procedure: DELAY PRIMARY CLOSURE FOOT  AMPUTATION;  Surgeon: Edrick Kins, DPM;  Location: [place];  Service: Podiatry;  Laterality: Right;   LEFT HEART CATH AND CORONARY ANGIOGRAPHY N/A 03/21/2020   Procedure: LEFT HEART CATH AND CORONARY ANGIOGRAPHY;  Surgeon: Martinique, Peter M, MD;  Location: [place] [facility];  Service: Cardiovascular;  Laterality: N/A;   LIVER BIOPSY  2005   Done in [place], [place]. Chronic hepatitis with mild periportal inflammation, lobular unicellular necrosis and portal fibrosis. Grade 2, stage 1-2.   LOWER EXTREMITY ANGIOGRAPHY Right 06/03/2016   Procedure: Lower Extremity Angiography;  Surgeon: Waynetta Sandy, MD;  Location: [place] [facility];  Service:  Cardiovascular;  Laterality: Right;   MAXIMUM ACCESS (MAS)POSTERIOR LUMBAR INTERBODY FUSION (PLIF) 1 LEVEL N/A 11/25/2013   Procedure: FOR MAXIMUM ACCESS (MAS) POSTERIOR LUMBAR INTERBODY FUSION (PLIF) 1 LEVEL;  Surgeon: Eustace Moore, MD;  Location: [place] [facility];  Service: Neurosurgery;  Laterality: N/A;  FOR MAXIMUM ACCESS (MAS) POSTERIOR LUMBAR INTERBODY FUSION (PLIF) 1 LEVEL LUMBAR 3-4   PERIPHERAL VASCULAR ATHERECTOMY Right 06/03/2016   Procedure: Peripheral Vascular Atherectomy;  Surgeon: Waynetta Sandy, MD;  Location: [place] [facility];  Service: Cardiovascular;  Laterality: Right;  SFA WITH STENT   PERIPHERAL VASCULAR CATHETERIZATION Left 08/01/2015   Procedure: Lower Extremity Angiography;  Surgeon: Serafina Mitchell, MD;  Location: [place] [facility];  Service: Cardiovascular;  Laterality: Left;   PERIPHERAL VASCULAR CATHETERIZATION N/A 08/01/2015   Procedure: Abdominal Aortogram;  Surgeon: Serafina Mitchell, MD;  Location: [place] [facility];  Service: Cardiovascular;  Laterality: N/A;   PERIPHERAL VASCULAR CATHETERIZATION N/A 08/08/2015   Procedure: Abdominal Aortogram w/Lower Extremity;  Surgeon: Serafina Mitchell, MD;  Location: [place] [facility];  Service: Cardiovascular;  Laterality: N/A;   PERIPHERAL VASCULAR CATHETERIZATION Left 08/08/2015   Procedure: Peripheral Vascular Balloon Angioplasty;  Surgeon: Serafina Mitchell, MD;  Location: [place] [facility];  Service: Cardiovascular;  Laterality: Left;  left popiteal artery, left peronealtrunk, left post tibial   POLYPECTOMY N/A 09/03/2012   Procedure: POLYPECTOMY;  Surgeon: Daneil Dolin, MD;  [facility];  Service: Endoscopy;  Laterality: N/A;  cecal polyp   SUBMUCOSAL TATTOO INJECTION  04/07/2020   Procedure: SUBMUCOSAL TATTOO INJECTION;  Surgeon: Lavena Bullion, DO;  Location: [place] [facility];  Service: Gastroenterology;;   TONSILLECTOMY     TRANSMETATARSAL AMPUTATION Right 06/13/2016   Procedure: RIGHT TRANSMETATARSAL  AMPUTATION;  Surgeon: Waynetta Sandy, MD;  Location: [facility];  Service: Vascular;  Laterality: Right;   TRANSMETATARSAL AMPUTATION Right 07/10/2016   Procedure: REVISION RIGHT TRANSMETATARSAL AMPUTATION;  Surgeon: Waynetta Sandy, MD;  Location: [place];  Service: Vascular;  Laterality: Right;       Family History  Problem Relation Age of Onset   Breast cancer Mother    Heart failure Mother    Diabetes Father    Hypertension Father    Heart disease Father    Hyperlipidemia Father    Diabetes Sister    Hypertension Sister    Breast cancer Sister    Colon cancer Neg Hx    Liver disease Neg Hx     Social History   Tobacco Use   Smoking status: Former    Packs/day: 1.00    Years: 47.00    Pack years: 47.00    Types: Cigarettes    Quit date: 07/13/2015    Years since quitting: 5.0   Smokeless tobacco: Never  Vaping Use   Vaping Use: Never used  Substance Use  Topics   Alcohol use: No    Alcohol/week: 0.0 standard drinks    Comment:  none 08/15/2015 "stopped drinking 3-4 years ago"   Drug use: No    Comment: 08/15/2015 "last used cocaine 3-4 months ago"    Home Medications Prior to Admission medications   Medication Sig Start Date End Date Taking? Authorizing Provider  acetaminophen (TYLENOL) 325 MG tablet Take 2 tablets (650 mg total) by mouth every 6 (six) hours as needed for mild pain or headache (fever >/= 101). 02/24/19   Roxan Hockey, MD  aspirin EC 81 MG tablet Take 1 tablet (81 mg total) by mouth daily with breakfast. 02/24/19   Roxan Hockey, MD  baclofen (LIORESAL) 10 MG tablet Take 1 tablet (10 mg total) by mouth every 8 (eight) hours as needed for muscle spasms. For shoulder spasm 06/03/20   Manuella Ghazi, Pratik D, DO  carvedilol (COREG) 25 MG tablet Take 1 tablet (25 mg total) by mouth 2 (two) times daily with a meal. 04/08/20   Regalado, Belkys A, MD  cefTRIAXone (ROCEPHIN) 1 g injection Inject 1 g into the muscle daily. For 5 days last dose yesterday  06/28/20   [provider]  cloNIDine (CATAPRES) 0.1 MG tablet Take 0.1 mg by mouth at bedtime.    [provider]  clopidogrel (PLAVIX) 75 MG tablet Take 75 mg by mouth daily. Patient not taking: Reported on 07/03/2020    [provider]  DULoxetine (CYMBALTA) 60 MG capsule Take 60 mg by mouth daily.    [provider]  ferrous gluconate (FERGON) 324 MG tablet Take 324 mg by mouth daily with breakfast.    [provider]  furosemide (LASIX) 20 MG tablet Take 80 mg by mouth daily. 06/09/20   [provider]  gabapentin (NEURONTIN) 300 MG capsule Take 300 mg by mouth 3 (three) times daily.    [provider]  glycerin adult 2 g suppository Place 1 suppository rectally as needed for constipation. 07/04/20   Tacy Learn, PA-C  hydrALAZINE (APRESOLINE) 50 MG tablet Take 1 tablet (50 mg total) by mouth daily. 06/24/20   Barton Dubois, MD  HYDROcodone-acetaminophen (NORCO/VICODIN) 5-325 MG tablet Take 1 tablet by mouth 4 (four) times daily as needed. 06/26/20   [provider]  insulin glargine (LANTUS) 100 UNIT/ML injection Inject 0.18 mLs (18 Units total) into the skin at bedtime. 06/24/20   Barton Dubois, MD  insulin lispro (HUMALOG) 100 UNIT/ML injection Inject 0.05 mLs (5 Units total) into the skin in the morning, at noon, and at bedtime. Give only if eats 50% or more of meal. Patient taking differently: Inject 8 Units into the skin in the morning, at noon, and at bedtime. Give only if eats 50% or more of meal. 06/24/20   Barton Dubois, MD  Insulin Lispro-aabc 200 UNIT/ML SOPN Inject 2-8 Units into the skin 3 (three) times daily with meals. And at bedtime. 200-250=2units 251-300=4units 301-350=6units 351-400=8units    [provider]  ipratropium-albuterol (DUONEB) 0.5-2.5 (3) MG/3ML SOLN Take 3 mLs by nebulization every 6 (six) hours as needed (SOB/ wheezing).    [provider]  iron polysaccharides (NIFEREX)  150 MG capsule Take 1 capsule (150 mg total) by mouth 2 (two) times daily. Patient not taking: Reported on 07/03/2020 06/24/20   Barton Dubois, MD  isosorbide mononitrate (IMDUR) 120 MG 24 hr tablet Take 1 tablet (120 mg total) by mouth daily. 04/08/20   Regalado, Belkys A, MD  Lidocaine 4 % PTCH Apply  1 patch topically daily. Apply 1 patch to both stumps once daily, 12 hour use    [provider]  loratadine (CLARITIN) 10 MG tablet Take 10 mg by mouth daily.    [provider]  Melatonin 5 MG TABS Take 1 tablet by mouth at bedtime.    [provider]  pantoprazole (PROTONIX) 40 MG tablet Take 1 tablet (40 mg total) by mouth 2 (two) times daily. 06/24/20   Barton Dubois, MD  polyethylene glycol (MIRALAX / GLYCOLAX) 17 g packet Take 17 g by mouth daily.    [provider]  polyethylene glycol (MIRALAX) 17 g packet Take 17 g by mouth daily. 07/04/20   Tacy Learn, PA-C  rosuvastatin (CRESTOR) 20 MG tablet Take 1 tablet (20 mg total) by mouth daily. Patient taking differently: Take 20 mg by mouth at bedtime. 04/08/20   Regalado, Belkys A, MD  senna (SENOKOT) 8.6 MG tablet Take 1 tablet by mouth 2 (two) times daily.    [provider]  tamsulosin (FLOMAX) 0.4 MG CAPS capsule Take 0.4 mg by mouth daily.    [provider]  vitamin B-12 (CYANOCOBALAMIN) 500 MCG tablet Take 500 mcg by mouth daily.     [provider]    Allergies    No known allergies  Review of Systems   Review of Systems  All other systems reviewed and are negative.  Physical Exam Updated Vital Signs BP 139/76   Pulse 72   Temp 97.9 F (36.6 C) (Oral)   Resp 18   Wt 113.9 kg   SpO2 96%   BMI 33.13 kg/m   Physical Exam Vitals and nursing note reviewed.  Constitutional:      General: He is not in acute distress.    Appearance: He is well-developed. He is obese. He is not ill-appearing, toxic-appearing or diaphoretic.  HENT:     Head: Normocephalic and  atraumatic.     Right Ear: External ear normal.     Left Ear: External ear normal.  Eyes:     Conjunctiva/sclera: Conjunctivae normal.     Pupils: Pupils are equal, round, and reactive to light.  Neck:     Trachea: Phonation normal.  Cardiovascular:     Rate and Rhythm: Normal rate and regular rhythm.     Heart sounds: Normal heart sounds.  Pulmonary:     Effort: Pulmonary effort is normal. No respiratory distress.     Breath sounds: Normal breath sounds. No stridor.  Abdominal:     General: Bowel sounds are normal.     Palpations: Abdomen is soft.     Tenderness: There is no abdominal tenderness (Upper abdomen, mild).  Musculoskeletal:        General: Normal range of motion.     Cervical back: Normal range of motion and neck supple.     Comments: Bilateral BKA  Skin:    General: Skin is warm and dry.  Neurological:     Mental Status: He is alert and oriented to person, place, and time.     Cranial Nerves: No cranial nerve deficit.     Sensory: No sensory deficit.     Motor: No abnormal muscle tone.     Coordination: Coordination normal.  Psychiatric:        Mood and Affect: Mood normal.        Behavior: Behavior normal.        Thought Content: Thought content normal.        Judgment:  Judgment normal.    ED Results / Procedures / Treatments   Labs (all labs ordered are listed, but only abnormal results are displayed) Labs Reviewed  COMPREHENSIVE METABOLIC PANEL - Abnormal; Notable for the following components:      Result Value   Sodium 133 (*)    Chloride 96 (*)    CO2 33 (*)    Glucose, Bld 343 (*)    BUN 25 (*)    Creatinine, Ser 1.80 (*)    Total Protein 10.4 (*)    Albumin 2.8 (*)    AST 14 (*)    Total Bilirubin 0.2 (*)    GFR, Estimated 41 (*)    Anion gap 4 (*)    All other components within normal limits  CBC WITH DIFFERENTIAL/PLATELET - Abnormal; Notable for the following components:   RBC 2.71 (*)    Hemoglobin 8.0 (*)    HCT 26.5 (*)    All  other components within normal limits  CBG MONITORING, ED - Abnormal; Notable for the following components:   Glucose-Capillary 313 (*)    All other components within normal limits  LIPASE, BLOOD  URINALYSIS, ROUTINE W REFLEX MICROSCOPIC    EKG None  Radiology US Abdomen Complete  Result Date: 07/21/2020 CLINICAL DATA:  Intermittent abdominal pain and right flank pain. EXAM: ABDOMEN ULTRASOUND COMPLETE COMPARISON:  None. FINDINGS: Gallbladder: The gallbladder measures 3 mm which is borderline. The gallbladder is otherwise normal in appearance. No Murphy's sign. Common bile duct: Diameter: 4 mm Liver: Increased echogenicity with no focal mass. Portal vein is patent on color Doppler imaging with normal direction of blood flow towards the liver. IVC: No abnormality visualized. Pancreas: Visualized portion unremarkable. Spleen: Not well visualized due to shadowing bowel gas. Right Kidney: Length: 11.2 cm. Echogenicity within normal limits. No mass or hydronephrosis visualized. Left Kidney: Length: 11.1 cm. Echogenicity within normal limits. No mass or hydronephrosis visualized. Abdominal aorta: The proximal abdominal aorta was measured at 3.8 cm in AP diameter. I believe this measurement is likely artifactual as the AP diameter on the CT scan from October 21, 2019 was only 2.6 cm. Other findings: None. IMPRESSION: 1. The gallbladder wall is borderline in thickness. The gallbladder is otherwise normal in appearance. 2. Increased echogenicity in the liver is nonspecific but often due to hepatic steatosis. 3. The pancreas was not visualized due to shadowing bowel gas. 4. The proximal aorta measured 3.8 cm in AP dimension. I suspect this is artifactual given the provided image and the fact that the proximal abdominal aorta measured 2.6 cm in maximum dimension on the CT scan from October 21, 2019. 5. No other abnormalities. Electronically Signed   By: Dorise Bullion III M.D   On: 07/21/2020 13:56     Procedures Procedures   Medications Ordered in ED Medications - No data to display  ED Course  I have reviewed the triage vital signs and the nursing notes.  Pertinent labs & imaging results that were available during my care of the patient were reviewed by me and considered in my medical decision making (see chart for details).    MDM Rules/Calculators/A&P                           Patient Vitals for the past 24 hrs:  BP Pulse Resp SpO2  07/21/20 1534 139/76 72 18 96 %  07/21/20 1431 139/76 72 18 99 %  07/21/20 1315 139/76 77 -- 99 %  07/21/20 1311 (!) 143/81 83 18 100 %  07/21/20 1145 (!) 132/91 78 18 99 %    2:15 PM Reevaluation with update and discussion. After initial assessment and treatment, an updated evaluation reveals he is comfortable at this time, watching TV and wants to go back to his facility.  Findings discussed with the patient and all questions were answered. Daleen Bo   Medical Decision Making:  This patient is presenting for evaluation of chills and sweats, which does require a range of treatment options, and is a complaint that involves a moderate risk of morbidity and mortality. The differential diagnoses include UTI, gallbladder disease, nonspecific complaints and malaise. I decided to review old records, and in summary elderly male, lives in a nursing care facility, recently treated for UTI presenting with nonspecific symptoms.  I did not require additional historical information from anyone.  Clinical Laboratory Tests Ordered, included CBC, Metabolic panel, and lipase . Review indicates normal except sodium low, chloride low, CO2 high, glucose high, BUN high, creatinine high, total protein high, albumin low, AST high, total bilirubin low, GFR low, hemoglobin low. Radiologic Tests Ordered, included abdominal ultrasound.  I independently Visualized: Radiographic images, which show mild gallbladder thickening and increased echogenicity of the  liver    Critical Interventions-clinical evaluation, laboratory testing, ultrasound imaging, observation and reassessment  After These Interventions, the Patient was reevaluated and was found stable for discharge.  Patient with nonspecific complaints and reassuring evaluation.  He has baseline abnormalities of electrolytes, hemoglobin and protein.  No significant change today.  No indication for hospitalization or further intervention at this time.  He can be managed as an outpatient.  CRITICAL CARE-no Performed by: Daleen Bo  Nursing Notes Reviewed/ Care Coordinated Applicable Imaging Reviewed Interpretation of Laboratory Data incorporated into ED treatment  The patient appears reasonably screened and/or stabilized for discharge and I doubt any other medical condition or other [facility] requiring further screening, evaluation, or treatment in the ED at this time prior to discharge.  Plan: Home Medications-continue usual; Home Treatments-gradual advance diet and activity; return here if the recommended treatment, does not improve the symptoms; Recommended follow up-PCP follow-up for ongoing management as planned and as needed     Final Clinical Impression(s) / ED Diagnoses Final diagnoses:  Anemia, unspecified type  Hyperproteinemia  Epigastric pain  Gallbladder disease    Rx / DC Orders ED Discharge Orders     None        Daleen Bo, MD 07/22/20 1143

## 2020-07-21 NOTE — ED Triage Notes (Signed)
Patient arrives with c/o "sweating", patient known diabetic from [facility] ate breakfast and was not given insulin. Current BG 404.

## 2020-07-21 NOTE — ED Notes (Signed)
Patient unable to sign for for discharge.

## 2020-07-21 NOTE — ED Notes (Signed)
Contacted [facility] LPN stated that patient received 8 units of insulin this morning, but did not receive sliding scale insulin.

## 2020-07-24 ENCOUNTER — Inpatient Hospital Stay (HOSPITAL_COMMUNITY): Payer: Medicare (Managed Care) | Attending: Hematology | Admitting: Hematology

## 2020-07-24 ENCOUNTER — Other Ambulatory Visit: Payer: Self-pay

## 2020-07-24 VITALS — BP 117/65 | HR 108 | Temp 97.0°F | Resp 18

## 2020-07-24 DIAGNOSIS — D472 Monoclonal gammopathy: Secondary | ICD-10-CM | POA: Diagnosis not present

## 2020-07-24 DIAGNOSIS — D649 Anemia, unspecified: Secondary | ICD-10-CM | POA: Insufficient documentation

## 2020-07-24 DIAGNOSIS — Z87891 Personal history of nicotine dependence: Secondary | ICD-10-CM | POA: Insufficient documentation

## 2020-07-24 NOTE — Patient Instructions (Signed)
[place] at [facility] Discharge Instructions  You were seen today by Dr. Delton Coombes. He went over your recent results. You will be scheduled for a biopsy of your bone marrow as well as a PET scan prior to your next visit. You will also be scheduled to receive 2 iron (Feraheme) infusions. Dr. Delton Coombes will see you back in 2 weeks for labs and follow up.   Thank you for choosing [place] at [facility] to provide your oncology and hematology care.  To afford each patient quality time with our provider, please arrive at least 15 minutes before your scheduled appointment time.   If you have a lab appointment with the [facility] please come in thru the Main Entrance and check in at the main information desk  You need to re-schedule your appointment should you arrive 10 or more minutes late.  We strive to give you quality time with our providers, and arriving late affects you and other patients whose appointments are after yours.  Also, if you no show three or more times for appointments you may be dismissed from the clinic at the providers discretion.     Again, thank you for choosing [facility].  Our hope is that these requests will decrease the amount of time that you wait before being seen by our physicians.       _____________________________________________________________  Should you have questions after your visit to [facility], please contact our office at [phone] between the hours of 8:00 a.m. and 4:30 p.m.  Voicemails left after 4:00 p.m. will not be returned until the following business day.  For prescription refill requests, have your pharmacy contact our office and allow 72 hours.    Cancer Center Support Programs:   > Cancer Support Group  2nd Tuesday of the month 1pm-2pm, Journey Room

## 2020-07-25 ENCOUNTER — Encounter: Payer: Self-pay | Admitting: Gastroenterology

## 2020-07-25 ENCOUNTER — Ambulatory Visit (INDEPENDENT_AMBULATORY_CARE_PROVIDER_SITE_OTHER): Payer: Medicare (Managed Care) | Admitting: Gastroenterology

## 2020-07-25 ENCOUNTER — Encounter: Payer: Self-pay | Admitting: Urology

## 2020-07-25 ENCOUNTER — Ambulatory Visit (INDEPENDENT_AMBULATORY_CARE_PROVIDER_SITE_OTHER): Payer: Medicare (Managed Care) | Admitting: Urology

## 2020-07-25 VITALS — BP 134/76 | HR 108

## 2020-07-25 VITALS — BP 132/70 | HR 88

## 2020-07-25 DIAGNOSIS — R339 Retention of urine, unspecified: Secondary | ICD-10-CM | POA: Insufficient documentation

## 2020-07-25 DIAGNOSIS — K760 Fatty (change of) liver, not elsewhere classified: Secondary | ICD-10-CM

## 2020-07-25 DIAGNOSIS — K5520 Angiodysplasia of colon without hemorrhage: Secondary | ICD-10-CM

## 2020-07-25 DIAGNOSIS — R933 Abnormal findings on diagnostic imaging of other parts of digestive tract: Secondary | ICD-10-CM

## 2020-07-25 DIAGNOSIS — D638 Anemia in other chronic diseases classified elsewhere: Secondary | ICD-10-CM

## 2020-07-25 DIAGNOSIS — R972 Elevated prostate specific antigen [PSA]: Secondary | ICD-10-CM | POA: Diagnosis not present

## 2020-07-25 DIAGNOSIS — R1084 Generalized abdominal pain: Secondary | ICD-10-CM

## 2020-07-25 LAB — BLADDER SCAN AMB NON-IMAGING: Scan Result: 626

## 2020-07-25 MED ORDER — TAMSULOSIN HCL 0.4 MG PO CAPS: 0.4000 mg | ORAL_CAPSULE | Freq: Two times a day (BID) | ORAL | 11 refills | Status: AC

## 2020-07-25 NOTE — Progress Notes (Signed)
post void residual=626  Urological Symptom Review  Patient is experiencing the following symptoms: Frequent urination Burning/pain with urination Get up at night to urinate Leakage of urine Urinary tract infection Weak stream Erection problems (male only) Kidney stones   Review of Systems  Gastrointestinal (upper)  : Indigestion/heartburn  Gastrointestinal (lower) : Constipation  Constitutional : Night Sweats Fatigue  Skin: Negative for skin symptoms  Eyes: Double vision  Ear/Nose/Throat : Negative for Ear/Nose/Throat symptoms  Hematologic/Lymphatic: Negative for Hematologic/Lymphatic symptoms  Cardiovascular : Chest pain  Respiratory : Cough Shortness of breath  Endocrine: Excessive thirst  Musculoskeletal: Back pain  Neurological: Dizziness  Psychologic: Depression Anxiety

## 2020-07-25 NOTE — Progress Notes (Signed)
[facility] Consult Note:  History: Cory Weber 07/25/2020  Referring provider: Caprice Renshaw, MD  Reason for consult/chief complaint: Abdominal Pain (Post ED), anemia, Gallbladder disease, and Constipation   Subjective  HPI:  This patient was sent by his nursing facility for follow-up after hospitalizations and ED visits.   I saw this patient in clinic September 2017 for history of hepatitis C and question of cirrhosis as well as anemia of chronic disease, multiple medical issues outlined in that note. He was admitted to [facility] with a non-STEMI in February of this year and had anemia and heme positive stool.  Negative upper endoscopy by Dr. Fuller Plan.  He was readmitted shortly afterward with acute posthemorrhagic anemia, repeat EGD and colonoscopy by Dr. Bryan Lemma without source.  Capsule study showed at least one AVM, enteroscopy on February 25 performed APC ablation on 2 diminutive nonbleeding AVMs in the distal duodenum. He was admitted to [facility] in April with anemia, upper endoscopy by Dr. Abbey Chatters found no source.  Cory Weber has been seen by Dr. Delton Coombes of hematology on May 3 and again yesterday for anemia.  From yesterday's note: "1.  Normocytic anemia: -likely etiology is blood loss anemia with combination of CKD.  Infiltrative disease from myeloma is also another consideration. - Reviewed his labs which showed ferritin 96 and percent saturation 11. - CBC on 07/21/2020 shows hemoglobin 8 and MCV of 97.  Other nutritional deficiency work-up was negative. - Reviewed ultrasound abdomen from 07/21/2020 which showed possible fatty infiltration of the liver.  Spleen could not be visualized properly. - I have recommended Feraheme weekly x2 for his anemia. - We discussed side effects of Feraheme in detail including rare chance of allergic reactions.  2.  IgG kappa monoclonal gammopathy: - M spike on 06/13/2020 showed 4 g.  Immunofixation showed  IgG kappa.  Kappa light chains were slightly elevated.  We talked about monoclonal gammopathy and multiple myeloma in general. - I have recommended bone marrow aspiration and biopsy for evaluation of myeloma.  We will send myeloma FISH and cytogenetics. - We will also order PET CT scan of the whole body to evaluate for bone lesions as well as solitary plasmacytoma. - RTC 2 weeks after biopsy." _______________________________ Patient is scheduled for iron infusions on June 20 and June 28, with a bone marrow biopsy shortly afterward.   Recent ED visits for right lower quadrant pain, urinary infection, and on June 10 for chills and sweating for few weeks.  He is a poor historian, and he is also somnolent during the visit.  As near as I can tell, he might not have a bowel movement every day.  He does not know if there has been any recent bleeding.  He says his appetite is good, he does not have nausea or vomiting.  He also describes some vague abdominal pain and runs his hand over his entire abdomen, and I cannot characterize this pain any further.  (Patient was 15 minutes late for today's clinic visit) ROS:  Review of Systems  Constitutional:  Positive for fatigue. Negative for appetite change and unexpected weight change.  HENT:  Negative for mouth sores and voice change.   Eyes:  Negative for pain and redness.  Respiratory:  Positive for shortness of breath. Negative for cough.   Cardiovascular:  Negative for chest pain and palpitations.  Genitourinary:  Negative for dysuria and hematuria.  Musculoskeletal:  Negative for arthralgias and myalgias.  Skin:  Negative for pallor  and rash.  Neurological:  Negative for weakness and headaches.       He says he sometimes "shakes"  Hematological:  Negative for adenopathy.    Past Medical History: Past Medical History:  Diagnosis Date   Acute anemia 06/01/2020   Anemia    Asthma    BPH (benign prostatic hyperplasia)    CAD (coronary artery  disease)    Severe multivessel disease 03/2020 - poor revascularization options   Chronic diastolic heart failure (HCC)    CKD (chronic kidney disease) stage 3, GFR 30-59 ml/min (HCC)    COPD (chronic obstructive pulmonary disease) (HCC)    CVA (cerebral vascular accident) ([place]) 2015   Essential hypertension    Gangrene ([place]) 06/10/2016   GERD (gastroesophageal reflux disease)    Hepatitis C    History of pneumonia    Hyperlipidemia    Osteomyelitis (HCC)    PAD (peripheral artery disease) (HCC)    Bilateral BKA   Peripheral neuropathy    Type II diabetes mellitus ([place])      Past Surgical History: Past Surgical History:  Procedure Laterality Date   ABDOMINAL AORTOGRAM N/A 06/03/2016   Procedure: Abdominal Aortogram;  Surgeon: Waynetta Sandy, MD;  Location: [place] [facility];  Service: Cardiovascular;  Laterality: N/A;   AMPUTATION Left 08/16/2015   Procedure: LEFT BELOW THE KNEE AMPUTATION ;  Surgeon: Serafina Mitchell, MD;  Location: [place];  Service: Vascular;  Laterality: Left;   AMPUTATION Right 08/22/2016   Procedure: AMPUTATION BELOW KNEE-RIGHT;  Surgeon: Waynetta Sandy, MD;  Location: [place];  Service: Vascular;  Laterality: Right;   BACK SURGERY     BELOW KNEE LEG AMPUTATION Left 08/16/2015   BIOPSY N/A 09/03/2012   Procedure: BIOPSY;  Surgeon: Daneil Dolin, MD;  [facility]: [facility];  Service: Endoscopy;  Laterality: N/A;  gastric and gastric mucosa   BIOPSY N/A 12/03/2012   Procedure: BIOPSY;  Surgeon: Daneil Dolin, MD;  [facility]: [facility];  Service: Endoscopy;  Laterality: N/A;   BIOPSY  03/31/2020   Procedure: BIOPSY;  Surgeon: Ladene Artist, MD;  Location: [place];  Service: Endoscopy;;   COLONOSCOPY WITH PROPOFOL N/A 09/03/2012   RXY:VOPFYTW polyp-removed as outlined above. Prominent internal hemorrhoids. Tubular adenoma   COLONOSCOPY WITH PROPOFOL N/A 04/05/2020   Procedure: COLONOSCOPY WITH PROPOFOL;  Surgeon: Lavena Bullion, DO;  Location:  [place];  Service: Gastroenterology;  Laterality: N/A;   ENTEROSCOPY Left 04/05/2020   Procedure: ENTEROSCOPY;  Surgeon: Lavena Bullion, DO;  Location: [place];  Service: Gastroenterology;  Laterality: Left;   ENTEROSCOPY N/A 04/07/2020   Procedure: ENTEROSCOPY;  Surgeon: Lavena Bullion, DO;  Location: [place];  Service: Gastroenterology;  Laterality: N/A;   ESOPHAGOGASTRODUODENOSCOPY (EGD) WITH PROPOFOL N/A 09/03/2012   KMQ:KMMNOT hernia. Gastric diverticulum. Gastric ulcers with associated erosions. Duodenal erosions. Status post gastric biopsy. H.PYLORI gastritis    ESOPHAGOGASTRODUODENOSCOPY (EGD) WITH PROPOFOL N/A 12/03/2012   Dr. Gala Romney: gastric diverticulum, gastric erosions and scar. Previously noted gastric ulcer completed healed. Biopsy without H.pylori.    ESOPHAGOGASTRODUODENOSCOPY (EGD) WITH PROPOFOL N/A 01/23/2016   Procedure: ESOPHAGOGASTRODUODENOSCOPY (EGD) WITH PROPOFOL;  Surgeon: Doran Stabler, MD;  Location: [facility];  Service: Gastroenterology;  Laterality: N/A;   ESOPHAGOGASTRODUODENOSCOPY (EGD) WITH PROPOFOL N/A 03/31/2020   Procedure: ESOPHAGOGASTRODUODENOSCOPY (EGD) WITH PROPOFOL;  Surgeon: Ladene Artist, MD;  Location: [facility]COPY;  Service: Endoscopy;  Laterality: N/A;   ESOPHAGOGASTRODUODENOSCOPY (EGD) WITH PROPOFOL N/A 06/02/2020   Procedure: ESOPHAGOGASTRODUODENOSCOPY (EGD) WITH  PROPOFOL;  Surgeon: Eloise Harman, DO;  [facility];  Service: Endoscopy;  Laterality: N/A;   GIVENS CAPSULE STUDY N/A 04/05/2020   Procedure: GIVENS CAPSULE STUDY;  Surgeon: Lavena Bullion, DO;  Location: [place];  Service: Gastroenterology;  Laterality: N/A;   HOT HEMOSTASIS N/A 04/07/2020   Procedure: HOT HEMOSTASIS (ARGON PLASMA COAGULATION/BICAP);  Surgeon: Lavena Bullion, DO;  Location: [facility] ENDOSCOPY;  Service: Gastroenterology;  Laterality: N/A;   I & D EXTREMITY Right 07/11/2016   Procedure: DELAY PRIMARY CLOSURE FOOT AMPUTATION;   Surgeon: Edrick Kins, DPM;  Location: [place];  Service: Podiatry;  Laterality: Right;   LEFT HEART CATH AND CORONARY ANGIOGRAPHY N/A 03/21/2020   Procedure: LEFT HEART CATH AND CORONARY ANGIOGRAPHY;  Surgeon: Martinique, Peter M, MD;  Location: [place] [facility];  Service: Cardiovascular;  Laterality: N/A;   LIVER BIOPSY  2005   Done in [place], [place]. Chronic hepatitis with mild periportal inflammation, lobular unicellular necrosis and portal fibrosis. Grade 2, stage 1-2.   LOWER EXTREMITY ANGIOGRAPHY Right 06/03/2016   Procedure: Lower Extremity Angiography;  Surgeon: Waynetta Sandy, MD;  Location: [place] [facility];  Service: Cardiovascular;  Laterality: Right;   MAXIMUM ACCESS (MAS)POSTERIOR LUMBAR INTERBODY FUSION (PLIF) 1 LEVEL N/A 11/25/2013   Procedure: FOR MAXIMUM ACCESS (MAS) POSTERIOR LUMBAR INTERBODY FUSION (PLIF) 1 LEVEL;  Surgeon: Eustace Moore, MD;  Location: [place] [facility];  Service: Neurosurgery;  Laterality: N/A;  FOR MAXIMUM ACCESS (MAS) POSTERIOR LUMBAR INTERBODY FUSION (PLIF) 1 LEVEL LUMBAR 3-4   PERIPHERAL VASCULAR ATHERECTOMY Right 06/03/2016   Procedure: Peripheral Vascular Atherectomy;  Surgeon: Waynetta Sandy, MD;  Location: [place] [facility];  Service: Cardiovascular;  Laterality: Right;  SFA WITH STENT   PERIPHERAL VASCULAR CATHETERIZATION Left 08/01/2015   Procedure: Lower Extremity Angiography;  Surgeon: Serafina Mitchell, MD;  Location: [place] [facility];  Service: Cardiovascular;  Laterality: Left;   PERIPHERAL VASCULAR CATHETERIZATION N/A 08/01/2015   Procedure: Abdominal Aortogram;  Surgeon: Serafina Mitchell, MD;  Location: [place] [facility];  Service: Cardiovascular;  Laterality: N/A;   PERIPHERAL VASCULAR CATHETERIZATION N/A 08/08/2015   Procedure: Abdominal Aortogram w/Lower Extremity;  Surgeon: Serafina Mitchell, MD;  Location: [place] [facility];  Service: Cardiovascular;  Laterality: N/A;   PERIPHERAL VASCULAR CATHETERIZATION Left 08/08/2015    Procedure: Peripheral Vascular Balloon Angioplasty;  Surgeon: Serafina Mitchell, MD;  Location: [place] [facility];  Service: Cardiovascular;  Laterality: Left;  left popiteal artery, left peronealtrunk, left post tibial   POLYPECTOMY N/A 09/03/2012   Procedure: POLYPECTOMY;  Surgeon: Daneil Dolin, MD;  [facility]: [facility];  Service: Endoscopy;  Laterality: N/A;  cecal polyp   SUBMUCOSAL TATTOO INJECTION  04/07/2020   Procedure: SUBMUCOSAL TATTOO INJECTION;  Surgeon: Lavena Bullion, DO;  Location: [place] [facility];  Service: Gastroenterology;;   TONSILLECTOMY     TRANSMETATARSAL AMPUTATION Right 06/13/2016   Procedure: RIGHT TRANSMETATARSAL AMPUTATION;  Surgeon: Waynetta Sandy, MD;  Location: [place];  Service: Vascular;  Laterality: Right;   TRANSMETATARSAL AMPUTATION Right 07/10/2016   Procedure: REVISION RIGHT TRANSMETATARSAL AMPUTATION;  Surgeon: Waynetta Sandy, MD;  Location: [place];  Service: Vascular;  Laterality: Right;     Family History: Family History  Problem Relation Age of Onset   Breast cancer Mother    Heart failure Mother    Diabetes Father    Hypertension Father    Heart disease Father    Hyperlipidemia Father    Heart  failure Father    Diabetes Sister    Hypertension Sister    Breast cancer Sister    Colon cancer Neg Hx    Liver disease Neg Hx     Social History: Social History   Socioeconomic History   Marital status: Divorced    Spouse name: Not on file   Number of children: 1   Years of education: Not on file   Highest education level: Not on file  Occupational History   Occupation: disabled  Tobacco Use   Smoking status: Former    Packs/day: 1.00    Years: 47.00    Pack years: 47.00    Types: Cigarettes    Quit date: 07/13/2015    Years since quitting: 5.0   Smokeless tobacco: Never  Vaping Use   Vaping Use: Never used  Substance and Sexual Activity   Alcohol use: No    Alcohol/week: 0.0 standard drinks    Comment:  none 08/15/2015  "stopped drinking 3-4 years ago"   Drug use: No    Comment: 08/15/2015 "last used cocaine 3-4 months ago"   Sexual activity: Not on file  Other Topics Concern   Not on file  Social History Narrative   Not on file   Social Determinants of Health   Financial Resource Strain: Not on file  Food Insecurity: Not on file  Transportation Needs: Not on file  Physical Activity: Not on file  Stress: Not on file  Social Connections: Not on file    Allergies: Allergies  Allergen Reactions   No Known Allergies     Outpatient Meds: Current Outpatient Medications  Medication Sig Dispense Refill   sodium phosphate (FLEET) 7-19 GM/118ML ENEM Place 1 enema rectally daily as needed for severe constipation.     acetaminophen (TYLENOL) 325 MG tablet Take 2 tablets (650 mg total) by mouth every 6 (six) hours as needed for mild pain or headache (fever >/= 101). 12 tablet 0   aspirin EC 81 MG tablet Take 1 tablet (81 mg total) by mouth daily with breakfast. 30 tablet 2   baclofen (LIORESAL) 10 MG tablet Take 1 tablet (10 mg total) by mouth every 8 (eight) hours as needed for muscle spasms. For shoulder spasm 10 tablet 0   carvedilol (COREG) 25 MG tablet Take 1 tablet (25 mg total) by mouth 2 (two) times daily with a meal. 60 tablet 3   cloNIDine (CATAPRES) 0.1 MG tablet Take 0.1 mg by mouth at bedtime.     clopidogrel (PLAVIX) 75 MG tablet Take 75 mg by mouth daily.     DULoxetine (CYMBALTA) 60 MG capsule Take 60 mg by mouth daily.     ferrous gluconate (FERGON) 324 MG tablet Take 324 mg by mouth daily with breakfast.     furosemide (LASIX) 20 MG tablet Take 80 mg by mouth daily.     gabapentin (NEURONTIN) 300 MG capsule Take 300 mg by mouth 3 (three) times daily.     glycerin adult 2 g suppository Place 1 suppository rectally as needed for constipation. 12 suppository 0   hydrALAZINE (APRESOLINE) 50 MG tablet Take 1 tablet (50 mg total) by mouth daily.     HYDROcodone-acetaminophen (NORCO/VICODIN)  5-325 MG tablet Take 1 tablet by mouth 4 (four) times daily as needed.     insulin aspart (NOVOLOG) 100 UNIT/ML injection Inject into the skin.     insulin glargine (LANTUS) 100 UNIT/ML injection Inject 0.18 mLs (18 Units total) into the skin at bedtime.  insulin lispro (HUMALOG) 100 UNIT/ML injection Inject 0.05 mLs (5 Units total) into the skin in the morning, at noon, and at bedtime. Give only if eats 50% or more of meal. (Patient taking differently: Inject 8 Units into the skin in the morning, at noon, and at bedtime. Give only if eats 50% or more of meal.)     ipratropium-albuterol (DUONEB) 0.5-2.5 (3) MG/3ML SOLN Take 3 mLs by nebulization every 6 (six) hours as needed (SOB/ wheezing).     iron polysaccharides (NIFEREX) 150 MG capsule Take 1 capsule (150 mg total) by mouth 2 (two) times daily. 60 capsule 3   isosorbide mononitrate (IMDUR) 120 MG 24 hr tablet Take 1 tablet (120 mg total) by mouth daily. 30 tablet 3   Lidocaine 4 % PTCH Apply 1 patch topically daily. Apply 1 patch to both stumps once daily, 12 hour use     loratadine (CLARITIN) 10 MG tablet Take 10 mg by mouth daily.     Melatonin 5 MG TABS Take 1 tablet by mouth at bedtime.     NOVOLOG FLEXPEN 100 UNIT/ML FlexPen Inject into the skin.     omeprazole (PRILOSEC) 20 MG capsule Take 1 capsule by mouth 2 (two) times daily.     pantoprazole (PROTONIX) 40 MG tablet Take 1 tablet (40 mg total) by mouth 2 (two) times daily. 60 tablet 1   polyethylene glycol (MIRALAX) 17 g packet Take 17 g by mouth daily. 14 each 0   rosuvastatin (CRESTOR) 20 MG tablet Take 1 tablet (20 mg total) by mouth daily. (Patient taking differently: Take 20 mg by mouth at bedtime.) 30 tablet 2   senna (SENOKOT) 8.6 MG tablet Take 1 tablet by mouth 2 (two) times daily.     tamsulosin (FLOMAX) 0.4 MG CAPS capsule Take 0.4 mg by mouth daily.     vitamin B-12 (CYANOCOBALAMIN) 500 MCG tablet Take 500 mcg by mouth daily.      No current facility-administered  medications for this visit.      ___________________________________________________________________ Objective   Exam:  BP 132/70 (BP Location: Left Arm, Patient Position: Sitting, Cuff Size: Normal)   Pulse 88  Wt Readings from Last 3 Encounters:  07/21/20 251 lb 1.7 oz (113.9 kg)  07/07/20 251 lb (113.9 kg)  07/04/20 250 lb (113.4 kg)    General: Chronically ill-appearing man in a wheelchair, bilateral BKA, wearing supplemental oxygen and somnolent Eyes: sclera anicteric, no redness ENT: oral mucosa moist without lesions, no cervical or supraclavicular lymphadenopathy.  Edentulous CV: RRR without murmur, S1/S2, no JVD, no peripheral edema Resp: Fair inspiratory effort, no wheezing, no tachypnea GI: soft, mild scattered tenderness of abdominal wall, with active bowel sounds.  Cannot assess mass hepatosplenomegaly due to abdominal girth. Skin; warm and dry, no rash or jaundice noted Neuro: awake, alert and oriented x 3. Normal gross motor function and fluent speech  Labs:  CBC Latest Ref Rng & Units 07/21/2020 07/07/2020 07/04/2020  WBC 4.0 - 10.5 K/uL 4.8 3.4(L) 3.5(L)  Hemoglobin 13.0 - 17.0 g/dL 8.0(L) 8.9(L) 8.4(L)  Hematocrit 39.0 - 52.0 % 26.5(L) 28.5(L) 27.2(L)  Platelets 150 - 400 K/uL 206 219 205   CMP Latest Ref Rng & Units 07/21/2020 07/07/2020 07/04/2020  Glucose 70 - 99 mg/dL 343(H) 247(H) 202(H)  BUN 8 - 23 mg/dL 25(H) 18 18  Creatinine 0.61 - 1.24 mg/dL 1.80(H) 1.59(H) 1.75(H)  Sodium 135 - 145 mmol/L 133(L) 130(L) 133(L)  Potassium 3.5 - 5.1 mmol/L 4.0 4.2 4.4  Chloride 98 -  111 mmol/L 96(L) 93(L) 96(L)  CO2 22 - 32 mmol/L 33(H) 34(H) 34(H)  Calcium 8.9 - 10.3 mg/dL 8.9 8.4(L) 8.6(L)  Total Protein 6.5 - 8.1 g/dL 10.4(H) 9.9(H) 9.4(H)  Total Bilirubin 0.3 - 1.2 mg/dL 0.2(L) 0.4 0.3  Alkaline Phos 38 - 126 U/L 48 59 55  AST 15 - 41 U/L 14(L) 15 12(L)  ALT 0 - 44 U/L 13 12 11      Radiologic Studies:  CLINICAL DATA:  Intermittent abdominal pain and right  flank pain.   EXAM: ABDOMEN ULTRASOUND COMPLETE   COMPARISON:  None.   FINDINGS: Gallbladder: The gallbladder measures 3 mm which is borderline. The gallbladder is otherwise normal in appearance. No Murphy's sign.   Common bile duct: Diameter: 4 mm   Liver: Increased echogenicity with no focal mass. Portal vein is patent on color Doppler imaging with normal direction of blood flow towards the liver.   IVC: No abnormality visualized.   Pancreas: Visualized portion unremarkable.   Spleen: Not well visualized due to shadowing bowel gas.   Right Kidney: Length: 11.2 cm. Echogenicity within normal limits. No mass or hydronephrosis visualized.   Left Kidney: Length: 11.1 cm. Echogenicity within normal limits. No mass or hydronephrosis visualized.   Abdominal aorta: The proximal abdominal aorta was measured at 3.8 cm in AP diameter. I believe this measurement is likely artifactual as the AP diameter on the CT scan from October 21, 2019 was only 2.6 cm.   Other findings: None.   IMPRESSION: 1. The gallbladder wall is borderline in thickness. The gallbladder is otherwise normal in appearance. 2. Increased echogenicity in the liver is nonspecific but often due to hepatic steatosis. 3. The pancreas was not visualized due to shadowing bowel gas. 4. The proximal aorta measured 3.8 cm in AP dimension. I suspect this is artifactual given the provided image and the fact that the proximal abdominal aorta measured 2.6 cm in maximum dimension on the CT scan from October 21, 2019. 5. No other abnormalities.     Electronically Signed   By: Dorise Bullion III M.D   On: 07/21/2020 13:56   Assessment: Encounter Diagnoses  Name Primary?   Anemia of chronic disease Yes   AVM (arteriovenous malformation) of small bowel, acquired    Generalized abdominal pain    Abnormal finding on GI tract imaging    NAFLD (nonalcoholic fatty liver disease)   Poorly controlled diabetes Chronic  hypercarbic respiratory failure  This man has multiple chronic severe medical conditions.  His anemia is multifactorial, and appears largely due to chronic kidney disease and perhaps an additional underlying marrow condition.  It is vital that he have close follow-up and monitoring and treatment with his hematologist. I believe there is a lesser component of chronic GI blood loss and small bowel AVMs exacerbated by antiplatelet therapy.  However, it is clear from his recent extensive endoscopic testing that any AVMs that can be reached and treated have been.  His abdominal pain is very difficult to characterize, and the mild gallbladder wall thickening on this ultrasound is nonspecific and does not represent chronic cholecystitis, and I do not think is the cause of his pain.  He is also had an endoscopic work-up during hospitalization for anemia with no cause found there.  Ultrasound with no ascites or other clear explanation.  It is most likely due to chronic constipation related to his medical illnesses and his immobility.  Chronic hyperglycemia can also cause abdominal pain.  He is currently on MiraLAX,  and that dose can be increased as needed under the direction of the physician supervising his care at the nursing facility.  Nonalcoholic fatty liver disease.  He has poorly controlled diabetes, and this certainly needs attention.  No evidence of portal hypertension on recent imaging or endoscopy.  As needed follow-up with this clinic.  (Total 45 minutes time inclusive of extensive chart review, patient evaluation and documentation.  Over 50% spent face-to-face with patient).  Thank you for the courtesy of this consult.  Please call me with any questions or concerns.  Nelida Meuse III  CC: Referring provider noted above

## 2020-07-25 NOTE — Patient Instructions (Signed)
Benign Prostatic Hyperplasia  Benign prostatic hyperplasia (BPH) is an enlarged prostate gland that is caused by the normal aging process and not by cancer. The prostate is a walnut-sized gland that is involved in the production of semen. It is located in front of the rectum and below the bladder. The bladder stores urine and the urethra is the tube that carries the urine out of the body. The prostate may get bigger asa man gets older. An enlarged prostate can press on the urethra. This can make it harder to pass urine. The build-up of urine in the bladder can cause infection. Back pressure and infection may progress to bladder damage and kidney (renal) failure. What are the causes? This condition is part of a normal aging process. However, not all men develop problems from this condition. If the prostate enlarges away from the urethra, urine flow will not be blocked. If it enlarges toward the urethra andcompresses it, there will be problems passing urine. What increases the risk? This condition is more likely to develop in men over the age of 50 years. What are the signs or symptoms? Symptoms of this condition include: Getting up often during the night to urinate. Needing to urinate frequently during the day. Difficulty starting urine flow. Decrease in size and strength of your urine stream. Leaking (dribbling) after urinating. Inability to pass urine. This needs immediate treatment. Inability to completely empty your bladder. Pain when you pass urine. This is more common if there is also an infection. Urinary tract infection (UTI). How is this diagnosed? This condition is diagnosed based on your medical history, a physical exam, and your symptoms. Tests will also be done, such as: A post-void bladder scan. This measures any amount of urine that may remain in your bladder after you finish urinating. A digital rectal exam. In a rectal exam, your health care provider checks your prostate by  putting a lubricated, gloved finger into your rectum to feel the back of your prostate gland. This exam detects the size of your gland and any abnormal lumps or growths. An exam of your urine (urinalysis). A prostate specific antigen (PSA) screening. This is a blood test used to screen for prostate cancer. An ultrasound. This test uses sound waves to electronically produce a picture of your prostate gland. Your health care provider may refer you to a specialist in kidney and prostate diseases (urologist). How is this treated? Once symptoms begin, your health care provider will monitor your condition (active surveillance or watchful waiting). Treatment for this condition will depend on the severity of your condition. Treatment may include: Observation and yearly exams. This may be the only treatment needed if your condition and symptoms are mild. Medicines to relieve your symptoms, including: Medicines to shrink the prostate. Medicines to relax the muscle of the prostate. Surgery in severe cases. Surgery may include: Prostatectomy. In this procedure, the prostate tissue is removed completely through an open incision or with a laparoscope or robotics. Transurethral resection of the prostate (TURP). In this procedure, a tool is inserted through the opening at the tip of the penis (urethra). It is used to cut away tissue of the inner core of the prostate. The pieces are removed through the same opening of the penis. This removes the blockage. Transurethral incision (TUIP). In this procedure, small cuts are made in the prostate. This lessens the prostate's pressure on the urethra. Transurethral microwave thermotherapy (TUMT). This procedure uses microwaves to create heat. The heat destroys and removes a small   amount of prostate tissue. Transurethral needle ablation (TUNA). This procedure uses radio frequencies to destroy and remove a small amount of prostate tissue. Interstitial laser coagulation (ILC).  This procedure uses a laser to destroy and remove a small amount of prostate tissue. Transurethral electrovaporization (TUVP). This procedure uses electrodes to destroy and remove a small amount of prostate tissue. Prostatic urethral lift. This procedure inserts an implant to push the lobes of the prostate away from the urethra. Follow these instructions at home: Take over-the-counter and prescription medicines only as told by your health care provider. Monitor your symptoms for any changes. Contact your health care provider with any changes. Avoid drinking large amounts of liquid before going to bed or out in public. Avoid or reduce how much caffeine or alcohol you drink. Give yourself time when you urinate. Keep all follow-up visits as told by your health care provider. This is important. Contact a health care provider if: You have unexplained back pain. Your symptoms do not get better with treatment. You develop side effects from the medicine you are taking. Your urine becomes very dark or has a bad smell. Your lower abdomen becomes distended and you have trouble passing your urine. Get help right away if: You have a fever or chills. You suddenly cannot urinate. You feel lightheaded, or very dizzy, or you faint. There are large amounts of blood or clots in the urine. Your urinary problems become hard to manage. You develop moderate to severe low back or flank pain. The flank is the side of your body between the ribs and the hip. These symptoms may represent a serious problem that is an emergency. Do not wait to see if the symptoms will go away. Get medical help right away. Call your local emergency services (911 in the [place] Do not drive yourself to the hospital. Summary Benign prostatic hyperplasia (BPH) is an enlarged prostate that is caused by the normal aging process and not by cancer. An enlarged prostate can press on the urethra. This can make it hard to pass urine. This  condition is part of a normal aging process and is more likely to develop in men over the age of 50 years. Get help right away if you suddenly cannot urinate. This information is not intended to replace advice given to you by your health care provider. Make sure you discuss any questions you have with your healthcare provider. Document Revised: 10/07/2019 Document Reviewed: 10/07/2019 Elsevier Patient Education  2022 Elsevier Inc.  

## 2020-07-25 NOTE — Progress Notes (Signed)
07/25/2020 2:37 PM   Cory Weber 06-21-1950 [id number]  Referring provider: Iona Beard, MD [address] [place],   [id number]  Elevated PSA   HPI: Mr Cory Weber is a 70yo here for evaluation of elevated PSA. PSA was 13.9 in 05/2020. No family hx of prostate cancer. He has a hx of CAD and is not a candidate for CABG or stenting. He remains on plavix. He has a history of bilateral BKA.  IPSS 26 QOL 6. PVR 632cc. He is very unhappy with urination. No feeling of incomplete emptying   PMH: Past Medical History:  Diagnosis Date   Acute anemia 06/01/2020   Anemia    Asthma    BPH (benign prostatic hyperplasia)    CAD (coronary artery disease)    Severe multivessel disease 03/2020 - poor revascularization options   Chronic diastolic heart failure (HCC)    CKD (chronic kidney disease) stage 3, GFR 30-59 ml/min (HCC)    COPD (chronic obstructive pulmonary disease) (HCC)    CVA (cerebral vascular accident) ([place]) 2015   Essential hypertension    Gangrene ([place]) 06/10/2016   GERD (gastroesophageal reflux disease)    Hepatitis C    History of pneumonia    Hyperlipidemia    Osteomyelitis (HCC)    PAD (peripheral artery disease) (HCC)    Bilateral BKA   Peripheral neuropathy    Type II diabetes mellitus ([place])     Surgical History: Past Surgical History:  Procedure Laterality Date   ABDOMINAL AORTOGRAM N/A 06/03/2016   Procedure: Abdominal Aortogram;  Surgeon: Waynetta Sandy, MD;  Location: [facility];  Service: Cardiovascular;  Laterality: N/A;   AMPUTATION Left 08/16/2015   Procedure: LEFT BELOW THE KNEE AMPUTATION ;  Surgeon: Serafina Mitchell, MD;  Location: [facility];  Service: Vascular;  Laterality: Left;   AMPUTATION Right 08/22/2016   Procedure: AMPUTATION BELOW KNEE-RIGHT;  Surgeon: Waynetta Sandy, MD;  Location: [facility];  Service: Vascular;  Laterality: Right;   BACK SURGERY     BELOW KNEE LEG AMPUTATION Left 08/16/2015   BIOPSY N/A 09/03/2012    Procedure: BIOPSY;  Surgeon: Daneil Dolin, MD;  [facility];  Service: Endoscopy;  Laterality: N/A;  gastric and gastric mucosa   BIOPSY N/A 12/03/2012   Procedure: BIOPSY;  Surgeon: Daneil Dolin, MD;  [facility];  Service: Endoscopy;  Laterality: N/A;   BIOPSY  03/31/2020   Procedure: BIOPSY;  Surgeon: Ladene Artist, MD;  Location: [facility];  Service: Endoscopy;;   COLONOSCOPY WITH PROPOFOL N/A 09/03/2012   QHU:TMLYYTK polyp-removed as outlined above. Prominent internal hemorrhoids. Tubular adenoma   COLONOSCOPY WITH PROPOFOL N/A 04/05/2020   Procedure: COLONOSCOPY WITH PROPOFOL;  Surgeon: Lavena Bullion, DO;  Location: [facility];  Service: Gastroenterology;  Laterality: N/A;   ENTEROSCOPY Left 04/05/2020   Procedure: ENTEROSCOPY;  Surgeon: Lavena Bullion, DO;  Location: [facility];  Service: Gastroenterology;  Laterality: Left;   ENTEROSCOPY N/A 04/07/2020   Procedure: ENTEROSCOPY;  Surgeon: Lavena Bullion, DO;  Location: [facility];  Service: Gastroenterology;  Laterality: N/A;   ESOPHAGOGASTRODUODENOSCOPY (EGD) WITH PROPOFOL N/A 09/03/2012   PTW:SFKCLE hernia. Gastric diverticulum. Gastric ulcers with associated erosions. Duodenal erosions. Status post gastric biopsy. H.PYLORI gastritis    ESOPHAGOGASTRODUODENOSCOPY (EGD) WITH PROPOFOL N/A 12/03/2012   Dr. Gala Romney: gastric diverticulum, gastric erosions and scar. Previously noted gastric ulcer completed healed. Biopsy without H.pylori.    ESOPHAGOGASTRODUODENOSCOPY (EGD) WITH PROPOFOL N/A 01/23/2016   Procedure:  ESOPHAGOGASTRODUODENOSCOPY (EGD) WITH PROPOFOL;  Surgeon: Doran Stabler, MD;  [facility]: [facility];  Service: Gastroenterology;  Laterality: N/A;   ESOPHAGOGASTRODUODENOSCOPY (EGD) WITH PROPOFOL N/A 03/31/2020   Procedure: ESOPHAGOGASTRODUODENOSCOPY (EGD) WITH PROPOFOL;  Surgeon: Ladene Artist, MD;  Location: [facility];  Service: Endoscopy;  Laterality: N/A;   ESOPHAGOGASTRODUODENOSCOPY  (EGD) WITH PROPOFOL N/A 06/02/2020   Procedure: ESOPHAGOGASTRODUODENOSCOPY (EGD) WITH PROPOFOL;  Surgeon: Eloise Harman, DO;  [facility];  Service: Endoscopy;  Laterality: N/A;   GIVENS CAPSULE STUDY N/A 04/05/2020   Procedure: GIVENS CAPSULE STUDY;  Surgeon: Lavena Bullion, DO;  Location: [facility];  Service: Gastroenterology;  Laterality: N/A;   HOT HEMOSTASIS N/A 04/07/2020   Procedure: HOT HEMOSTASIS (ARGON PLASMA COAGULATION/BICAP);  Surgeon: Lavena Bullion, DO;  Location: [facility];  Service: Gastroenterology;  Laterality: N/A;   I & D EXTREMITY Right 07/11/2016   Procedure: DELAY PRIMARY CLOSURE FOOT AMPUTATION;  Surgeon: Edrick Kins, DPM;  Location: [facility];  Service: Podiatry;  Laterality: Right;   LEFT HEART CATH AND CORONARY ANGIOGRAPHY N/A 03/21/2020   Procedure: LEFT HEART CATH AND CORONARY ANGIOGRAPHY;  Surgeon: Martinique, Peter M, MD;  Location: [facility];  Service: Cardiovascular;  Laterality: N/A;   LIVER BIOPSY  2005   Done in [place], [place]. Chronic hepatitis with mild periportal inflammation, lobular unicellular necrosis and portal fibrosis. Grade 2, stage 1-2.   LOWER EXTREMITY ANGIOGRAPHY Right 06/03/2016   Procedure: Lower Extremity Angiography;  Surgeon: Waynetta Sandy, MD;  Location: [facility];  Service: Cardiovascular;  Laterality: Right;   MAXIMUM ACCESS (MAS)POSTERIOR LUMBAR INTERBODY FUSION (PLIF) 1 LEVEL N/A 11/25/2013   Procedure: FOR MAXIMUM ACCESS (MAS) POSTERIOR LUMBAR INTERBODY FUSION (PLIF) 1 LEVEL;  Surgeon: Eustace Moore, MD;  Location: [facility];  Service: Neurosurgery;  Laterality: N/A;  FOR MAXIMUM ACCESS (MAS) POSTERIOR LUMBAR INTERBODY FUSION (PLIF) 1 LEVEL LUMBAR 3-4   PERIPHERAL VASCULAR ATHERECTOMY Right 06/03/2016   Procedure: Peripheral Vascular Atherectomy;  Surgeon: Waynetta Sandy, MD;  Location: [facility];  Service: Cardiovascular;  Laterality: Right;  SFA WITH STENT    PERIPHERAL VASCULAR CATHETERIZATION Left 08/01/2015   Procedure: Lower Extremity Angiography;  Surgeon: Serafina Mitchell, MD;  Location: [facility];  Service: Cardiovascular;  Laterality: Left;   PERIPHERAL VASCULAR CATHETERIZATION N/A 08/01/2015   Procedure: Abdominal Aortogram;  Surgeon: Serafina Mitchell, MD;  Location: [facility];  Service: Cardiovascular;  Laterality: N/A;   PERIPHERAL VASCULAR CATHETERIZATION N/A 08/08/2015   Procedure: Abdominal Aortogram w/Lower Extremity;  Surgeon: Serafina Mitchell, MD;  Location: [facility];  Service: Cardiovascular;  Laterality: N/A;   PERIPHERAL VASCULAR CATHETERIZATION Left 08/08/2015   Procedure: Peripheral Vascular Balloon Angioplasty;  Surgeon: Serafina Mitchell, MD;  Location: [facility];  Service: Cardiovascular;  Laterality: Left;  left popiteal artery, left peronealtrunk, left post tibial   POLYPECTOMY N/A 09/03/2012   Procedure: POLYPECTOMY;  Surgeon: Daneil Dolin, MD;  [facility];  Service: Endoscopy;  Laterality: N/A;  cecal polyp   SUBMUCOSAL TATTOO INJECTION  04/07/2020   Procedure: SUBMUCOSAL TATTOO INJECTION;  Surgeon: Lavena Bullion, DO;  Location: [facility];  Service: Gastroenterology;;   TONSILLECTOMY     TRANSMETATARSAL AMPUTATION Right 06/13/2016   Procedure: RIGHT TRANSMETATARSAL AMPUTATION;  Surgeon: Waynetta Sandy, MD;  Location: [facility];  Service: Vascular;  Laterality: Right;   TRANSMETATARSAL AMPUTATION Right 07/10/2016   Procedure: REVISION RIGHT TRANSMETATARSAL AMPUTATION;  Surgeon: Waynetta Sandy, MD;  Location: [facility];  Service: Vascular;  Laterality: Right;    Home Medications:  Allergies as of 07/25/2020       Reactions   No Known Allergies         Medication List        Accurate as of July 25, 2020  2:37 PM. If you have any questions, ask your nurse or doctor.          STOP taking these medications    doxycycline 100 MG capsule Commonly known as:  MONODOX Stopped by: Doran Stabler, MD       TAKE these medications    acetaminophen 325 MG tablet Commonly known as: TYLENOL Take 2 tablets (650 mg total) by mouth every 6 (six) hours as needed for mild pain or headache (fever >/= 101).   aspirin EC 81 MG tablet Take 1 tablet (81 mg total) by mouth daily with breakfast.   baclofen 10 MG tablet Commonly known as: LIORESAL Take 1 tablet (10 mg total) by mouth every 8 (eight) hours as needed for muscle spasms. For shoulder spasm   carvedilol 25 MG tablet Commonly known as: COREG Take 1 tablet (25 mg total) by mouth 2 (two) times daily with a meal.   cloNIDine 0.1 MG tablet Commonly known as: CATAPRES Take 0.1 mg by mouth at bedtime.   clopidogrel 75 MG tablet Commonly known as: PLAVIX Take 75 mg by mouth daily.   DULoxetine 60 MG capsule Commonly known as: CYMBALTA Take 60 mg by mouth daily.   ferrous gluconate 324 MG tablet Commonly known as: FERGON Take 324 mg by mouth daily with breakfast.   furosemide 20 MG tablet Commonly known as: LASIX Take 80 mg by mouth daily.   gabapentin 300 MG capsule Commonly known as: NEURONTIN Take 300 mg by mouth 3 (three) times daily.   glycerin adult 2 g suppository Place 1 suppository rectally as needed for constipation.   hydrALAZINE 50 MG tablet Commonly known as: APRESOLINE Take 1 tablet (50 mg total) by mouth daily.   HYDROcodone-acetaminophen 5-325 MG tablet Commonly known as: NORCO/VICODIN Take 1 tablet by mouth 4 (four) times daily as needed.   insulin aspart 100 UNIT/ML injection Commonly known as: novoLOG Inject into the skin.   NovoLOG FlexPen 100 UNIT/ML FlexPen Generic drug: insulin aspart Inject into the skin.   insulin glargine 100 UNIT/ML injection Commonly known as: LANTUS Inject 0.18 mLs (18 Units total) into the skin at bedtime.   insulin lispro 100 UNIT/ML injection Commonly known as: HUMALOG Inject 0.05 mLs (5 Units total) into the skin in  the morning, at noon, and at bedtime. Give only if eats 50% or more of meal. What changed: how much to take   ipratropium-albuterol 0.5-2.5 (3) MG/3ML Soln Commonly known as: DUONEB Take 3 mLs by nebulization every 6 (six) hours as needed (SOB/ wheezing).   iron polysaccharides 150 MG capsule Commonly known as: NIFEREX Take 1 capsule (150 mg total) by mouth 2 (two) times daily.   isosorbide mononitrate 120 MG 24 hr tablet Commonly known as: IMDUR Take 1 tablet (120 mg total) by mouth daily.   Lidocaine 4 % Ptch Apply 1 patch topically daily. Apply 1 patch to both stumps once daily, 12 hour use   loratadine 10 MG tablet Commonly known as: CLARITIN Take 10 mg by mouth daily.   melatonin 5 MG Tabs Take 1 tablet by mouth at bedtime.   omeprazole 20 MG capsule  Commonly known as: PRILOSEC Take 1 capsule by mouth 2 (two) times daily.   pantoprazole 40 MG tablet Commonly known as: PROTONIX Take 1 tablet (40 mg total) by mouth 2 (two) times daily.   polyethylene glycol 17 g packet Commonly known as: MiraLax Take 17 g by mouth daily.   rosuvastatin 20 MG tablet Commonly known as: CRESTOR Take 1 tablet (20 mg total) by mouth daily. What changed: when to take this   senna 8.6 MG tablet Commonly known as: SENOKOT Take 1 tablet by mouth 2 (two) times daily.   sodium phosphate 7-19 GM/118ML Enem Place 1 enema rectally daily as needed for severe constipation.   tamsulosin 0.4 MG Caps capsule Commonly known as: FLOMAX Take 0.4 mg by mouth daily.   vitamin B-12 500 MCG tablet Commonly known as: CYANOCOBALAMIN Take 500 mcg by mouth daily.        Allergies:  Allergies  Allergen Reactions   No Known Allergies     Family History: Family History  Problem Relation Age of Onset   Breast cancer Mother    Heart failure Mother    Diabetes Father    Hypertension Father    Heart disease Father    Hyperlipidemia Father    Heart failure Father    Diabetes Sister     Hypertension Sister    Breast cancer Sister    Colon cancer Neg Hx    Liver disease Neg Hx     Social History:  reports that he quit smoking about 5 years ago. His smoking use included cigarettes. He has a 47.00 pack-year smoking history. He has never used smokeless tobacco. He reports that he does not drink alcohol and does not use drugs.  ROS: All other review of systems were reviewed and are negative except what is noted above in HPI  Physical Exam: BP 134/76   Pulse (!) 108   Constitutional:  Alert and oriented, No acute distress. HEENT: [place] AT, moist mucus membranes.  Trachea midline, no masses. Cardiovascular: No clubbing, cyanosis, or edema. Respiratory: Normal respiratory effort, no increased work of breathing. GI: Abdomen is soft, nontender, nondistended, no abdominal masses GU: No CVA tenderness.  Lymph: No cervical or inguinal lymphadenopathy. Skin: No rashes, bruises or suspicious lesions. Neurologic: Grossly intact, no focal deficits, moving all 4 extremities. Psychiatric: Normal mood and affect.  Laboratory Data: Lab Results  Component Value Date   WBC 4.8 07/21/2020   HGB 8.0 (L) 07/21/2020   HCT 26.5 (L) 07/21/2020   MCV 97.8 07/21/2020   PLT 206 07/21/2020    Lab Results  Component Value Date   CREATININE 1.80 (H) 07/21/2020    No results found for: PSA  No results found for: TESTOSTERONE  Lab Results  Component Value Date   HGBA1C 7.4 (H) 06/10/2020    Urinalysis    Component Value Date/Time   COLORURINE YELLOW 07/07/2020 [place] 07/07/2020 1327   LABSPEC 1.011 07/07/2020 1327   PHURINE 6.0 07/07/2020 1327   GLUCOSEU NEGATIVE 07/07/2020 [place] 07/07/2020 [place] 07/07/2020 1327   [place] 07/07/2020 1327   PROTEINUR 30 (A) 07/07/2020 1327   UROBILINOGEN 1.0 11/29/2013 1705   NITRITE NEGATIVE 07/07/2020 1327   LEUKOCYTESUR SMALL (A) 07/07/2020 1327    Lab Results   Component Value Date   BACTERIA RARE (A) 07/07/2020    Pertinent Imaging:  No results found for this or any previous visit.  No results found for  this or any previous visit.  No results found for this or any previous visit.  No results found for this or any previous visit.  Results for orders placed during the hospital encounter of 07/23/15  US Renal  Narrative CLINICAL DATA:  Acute kidney injury  EXAM: RENAL / URINARY TRACT ULTRASOUND COMPLETE  COMPARISON:  None.  FINDINGS: Right Kidney:  Length: 12.6 cm. Mildly increased echogenicity. No hydronephrosis or mass.  Left Kidney:  Length: 12.6 cm. Mildly increased echogenicity. No hydronephrosis or mass.  Bladder:  Not evaluated.  Decompressed by Foley catheter.  IMPRESSION: Evidence of medical renal disease   Electronically Signed By: Skipper Cliche M.D. On: 07/27/2015 11:21  No results found for this or any previous visit.  No results found for this or any previous visit.  No results found for this or any previous visit.   Assessment & Plan:    1. Elevated PSA -The patient and I talked about etiologies of elevated PSA.  We discussed the possible relationship between elevated PSA and prostate cancer, BPH, prostatitis, infection trauma and recent ejaculations. We will address his urinary rentention prior to rechecking his PSA - Urinalysis, Routine w reflex microscopic - BLADDER SCAN AMB NON-IMAGING  2. Urinary retention We will increase flomax to BID   No follow-ups on file.  Nicolette Bang, MD  [facility] [place]

## 2020-07-25 NOTE — Patient Instructions (Addendum)
If you are age 68 or older, your body mass index should be between 23-30. Your There is no height or weight on file to calculate BMI. If this is out of the aforementioned range listed, please consider follow up with your Primary Care Provider.  If you are age 64 or younger, your body mass index should be between 19-25. Your There is no height or weight on file to calculate BMI. If this is out of the aformentioned range listed, please consider follow up with your Primary Care Provider.   __________________________________________________________  The [facility] providers would like to encourage you to use MYCHART to communicate with providers for non-urgent requests or questions.  Due to long hold times on the telephone, sending your provider a message by MYCHART may be a faster and more efficient way to get a response.  Please allow 48 business hours for a response.  Please remember that this is for non-urgent requests.    It was a pleasure to see you today!  Thank you for trusting me with your gastrointestinal care!     

## 2020-07-29 ENCOUNTER — Inpatient Hospital Stay (HOSPITAL_COMMUNITY)
Admission: EM | Admit: 2020-07-29 | Discharge: 2020-08-02 | DRG: 280 | Disposition: A | Discharge status: Skilled Nursing Facility | Payer: Medicare (Managed Care) | Source: Skilled Nursing Facility | Attending: Family Medicine | Admitting: Family Medicine

## 2020-07-29 DIAGNOSIS — I214 Non-ST elevation (NSTEMI) myocardial infarction: Secondary | ICD-10-CM | POA: Diagnosis not present

## 2020-07-29 DIAGNOSIS — K5900 Constipation, unspecified: Secondary | ICD-10-CM

## 2020-07-29 DIAGNOSIS — Z794 Long term (current) use of insulin: Secondary | ICD-10-CM

## 2020-07-29 DIAGNOSIS — N183 Chronic kidney disease, stage 3 unspecified: Secondary | ICD-10-CM | POA: Diagnosis present

## 2020-07-29 DIAGNOSIS — R0603 Acute respiratory distress: Secondary | ICD-10-CM

## 2020-07-29 DIAGNOSIS — R7989 Other specified abnormal findings of blood chemistry: Secondary | ICD-10-CM

## 2020-07-29 DIAGNOSIS — Z7902 Long term (current) use of antithrombotics/antiplatelets: Secondary | ICD-10-CM

## 2020-07-29 DIAGNOSIS — I130 Hypertensive heart and chronic kidney disease with heart failure and stage 1 through stage 4 chronic kidney disease, or unspecified chronic kidney disease: Secondary | ICD-10-CM | POA: Diagnosis present

## 2020-07-29 DIAGNOSIS — I2510 Atherosclerotic heart disease of native coronary artery without angina pectoris: Secondary | ICD-10-CM

## 2020-07-29 DIAGNOSIS — E8809 Other disorders of plasma-protein metabolism, not elsewhere classified: Secondary | ICD-10-CM

## 2020-07-29 DIAGNOSIS — N1832 Chronic kidney disease, stage 3b: Secondary | ICD-10-CM | POA: Diagnosis present

## 2020-07-29 DIAGNOSIS — Z66 Do not resuscitate: Secondary | ICD-10-CM | POA: Diagnosis present

## 2020-07-29 DIAGNOSIS — E1165 Type 2 diabetes mellitus with hyperglycemia: Secondary | ICD-10-CM

## 2020-07-29 DIAGNOSIS — I70203 Unspecified atherosclerosis of native arteries of extremities, bilateral legs: Secondary | ICD-10-CM | POA: Diagnosis present

## 2020-07-29 DIAGNOSIS — E785 Hyperlipidemia, unspecified: Secondary | ICD-10-CM | POA: Diagnosis present

## 2020-07-29 DIAGNOSIS — Z79899 Other long term (current) drug therapy: Secondary | ICD-10-CM

## 2020-07-29 DIAGNOSIS — G8929 Other chronic pain: Secondary | ICD-10-CM | POA: Diagnosis present

## 2020-07-29 DIAGNOSIS — E1142 Type 2 diabetes mellitus with diabetic polyneuropathy: Secondary | ICD-10-CM | POA: Diagnosis present

## 2020-07-29 DIAGNOSIS — J9621 Acute and chronic respiratory failure with hypoxia: Secondary | ICD-10-CM | POA: Diagnosis present

## 2020-07-29 DIAGNOSIS — D631 Anemia in chronic kidney disease: Secondary | ICD-10-CM | POA: Diagnosis present

## 2020-07-29 DIAGNOSIS — Z8719 Personal history of other diseases of the digestive system: Secondary | ICD-10-CM

## 2020-07-29 DIAGNOSIS — B9620 Unspecified Escherichia coli [E. coli] as the cause of diseases classified elsewhere: Secondary | ICD-10-CM | POA: Diagnosis present

## 2020-07-29 DIAGNOSIS — N390 Urinary tract infection, site not specified: Secondary | ICD-10-CM | POA: Diagnosis present

## 2020-07-29 DIAGNOSIS — Z833 Family history of diabetes mellitus: Secondary | ICD-10-CM

## 2020-07-29 DIAGNOSIS — J9601 Acute respiratory failure with hypoxia: Secondary | ICD-10-CM

## 2020-07-29 DIAGNOSIS — E669 Obesity, unspecified: Secondary | ICD-10-CM | POA: Diagnosis present

## 2020-07-29 DIAGNOSIS — Z89511 Acquired absence of right leg below knee: Secondary | ICD-10-CM

## 2020-07-29 DIAGNOSIS — I161 Hypertensive emergency: Secondary | ICD-10-CM | POA: Diagnosis present

## 2020-07-29 DIAGNOSIS — R54 Age-related physical debility: Secondary | ICD-10-CM | POA: Diagnosis present

## 2020-07-29 DIAGNOSIS — Z87891 Personal history of nicotine dependence: Secondary | ICD-10-CM

## 2020-07-29 DIAGNOSIS — N400 Enlarged prostate without lower urinary tract symptoms: Secondary | ICD-10-CM | POA: Diagnosis present

## 2020-07-29 DIAGNOSIS — D62 Acute posthemorrhagic anemia: Secondary | ICD-10-CM | POA: Diagnosis present

## 2020-07-29 DIAGNOSIS — I5023 Acute on chronic systolic (congestive) heart failure: Secondary | ICD-10-CM

## 2020-07-29 DIAGNOSIS — Z1612 Extended spectrum beta lactamase (ESBL) resistance: Secondary | ICD-10-CM | POA: Diagnosis present

## 2020-07-29 DIAGNOSIS — Z8616 Personal history of COVID-19: Secondary | ICD-10-CM

## 2020-07-29 DIAGNOSIS — Z89512 Acquired absence of left leg below knee: Secondary | ICD-10-CM

## 2020-07-29 DIAGNOSIS — Z8249 Family history of ischemic heart disease and other diseases of the circulatory system: Secondary | ICD-10-CM

## 2020-07-29 DIAGNOSIS — Z8701 Personal history of pneumonia (recurrent): Secondary | ICD-10-CM

## 2020-07-29 DIAGNOSIS — Z8673 Personal history of transient ischemic attack (TIA), and cerebral infarction without residual deficits: Secondary | ICD-10-CM

## 2020-07-29 DIAGNOSIS — E440 Moderate protein-calorie malnutrition: Secondary | ICD-10-CM | POA: Diagnosis present

## 2020-07-29 DIAGNOSIS — Z8349 Family history of other endocrine, nutritional and metabolic diseases: Secondary | ICD-10-CM

## 2020-07-29 DIAGNOSIS — D539 Nutritional anemia, unspecified: Secondary | ICD-10-CM | POA: Diagnosis present

## 2020-07-29 DIAGNOSIS — E1151 Type 2 diabetes mellitus with diabetic peripheral angiopathy without gangrene: Secondary | ICD-10-CM | POA: Diagnosis present

## 2020-07-29 DIAGNOSIS — R778 Other specified abnormalities of plasma proteins: Secondary | ICD-10-CM

## 2020-07-29 DIAGNOSIS — I5042 Chronic combined systolic (congestive) and diastolic (congestive) heart failure: Secondary | ICD-10-CM | POA: Diagnosis present

## 2020-07-29 DIAGNOSIS — D649 Anemia, unspecified: Secondary | ICD-10-CM | POA: Diagnosis present

## 2020-07-29 DIAGNOSIS — I739 Peripheral vascular disease, unspecified: Secondary | ICD-10-CM | POA: Diagnosis present

## 2020-07-29 DIAGNOSIS — I10 Essential (primary) hypertension: Secondary | ICD-10-CM | POA: Diagnosis present

## 2020-07-29 DIAGNOSIS — I255 Ischemic cardiomyopathy: Secondary | ICD-10-CM | POA: Diagnosis present

## 2020-07-29 DIAGNOSIS — Z981 Arthrodesis status: Secondary | ICD-10-CM

## 2020-07-29 DIAGNOSIS — J449 Chronic obstructive pulmonary disease, unspecified: Secondary | ICD-10-CM | POA: Diagnosis present

## 2020-07-29 DIAGNOSIS — E1122 Type 2 diabetes mellitus with diabetic chronic kidney disease: Secondary | ICD-10-CM | POA: Diagnosis present

## 2020-07-29 DIAGNOSIS — E871 Hypo-osmolality and hyponatremia: Secondary | ICD-10-CM | POA: Diagnosis present

## 2020-07-29 DIAGNOSIS — Z6833 Body mass index (BMI) 33.0-33.9, adult: Secondary | ICD-10-CM

## 2020-07-29 DIAGNOSIS — D500 Iron deficiency anemia secondary to blood loss (chronic): Secondary | ICD-10-CM | POA: Diagnosis present

## 2020-07-29 DIAGNOSIS — Z7982 Long term (current) use of aspirin: Secondary | ICD-10-CM

## 2020-07-29 DIAGNOSIS — K219 Gastro-esophageal reflux disease without esophagitis: Secondary | ICD-10-CM | POA: Diagnosis present

## 2020-07-30 ENCOUNTER — Encounter (HOSPITAL_COMMUNITY): Payer: Self-pay

## 2020-07-30 ENCOUNTER — Telehealth: Payer: Self-pay | Admitting: Student in an Organized Health Care Education/Training Program

## 2020-07-30 ENCOUNTER — Other Ambulatory Visit: Payer: Self-pay

## 2020-07-30 ENCOUNTER — Emergency Department (HOSPITAL_COMMUNITY): Payer: Medicare (Managed Care)

## 2020-07-30 ENCOUNTER — Inpatient Hospital Stay (HOSPITAL_COMMUNITY): Payer: Medicare (Managed Care)

## 2020-07-30 DIAGNOSIS — D62 Acute posthemorrhagic anemia: Secondary | ICD-10-CM | POA: Diagnosis not present

## 2020-07-30 DIAGNOSIS — J449 Chronic obstructive pulmonary disease, unspecified: Secondary | ICD-10-CM | POA: Diagnosis present

## 2020-07-30 DIAGNOSIS — D631 Anemia in chronic kidney disease: Secondary | ICD-10-CM | POA: Diagnosis present

## 2020-07-30 DIAGNOSIS — Z66 Do not resuscitate: Secondary | ICD-10-CM | POA: Diagnosis present

## 2020-07-30 DIAGNOSIS — E440 Moderate protein-calorie malnutrition: Secondary | ICD-10-CM | POA: Diagnosis present

## 2020-07-30 DIAGNOSIS — I2510 Atherosclerotic heart disease of native coronary artery without angina pectoris: Secondary | ICD-10-CM | POA: Diagnosis present

## 2020-07-30 DIAGNOSIS — Z1612 Extended spectrum beta lactamase (ESBL) resistance: Secondary | ICD-10-CM | POA: Diagnosis present

## 2020-07-30 DIAGNOSIS — D500 Iron deficiency anemia secondary to blood loss (chronic): Secondary | ICD-10-CM | POA: Diagnosis present

## 2020-07-30 DIAGNOSIS — N1832 Chronic kidney disease, stage 3b: Secondary | ICD-10-CM | POA: Diagnosis present

## 2020-07-30 DIAGNOSIS — I739 Peripheral vascular disease, unspecified: Secondary | ICD-10-CM

## 2020-07-30 DIAGNOSIS — E782 Mixed hyperlipidemia: Secondary | ICD-10-CM

## 2020-07-30 DIAGNOSIS — E871 Hypo-osmolality and hyponatremia: Secondary | ICD-10-CM

## 2020-07-30 DIAGNOSIS — D539 Nutritional anemia, unspecified: Secondary | ICD-10-CM | POA: Diagnosis present

## 2020-07-30 DIAGNOSIS — E8809 Other disorders of plasma-protein metabolism, not elsewhere classified: Secondary | ICD-10-CM

## 2020-07-30 DIAGNOSIS — I130 Hypertensive heart and chronic kidney disease with heart failure and stage 1 through stage 4 chronic kidney disease, or unspecified chronic kidney disease: Secondary | ICD-10-CM | POA: Diagnosis present

## 2020-07-30 DIAGNOSIS — I161 Hypertensive emergency: Secondary | ICD-10-CM | POA: Diagnosis present

## 2020-07-30 DIAGNOSIS — Z515 Encounter for palliative care: Secondary | ICD-10-CM | POA: Diagnosis not present

## 2020-07-30 DIAGNOSIS — E1165 Type 2 diabetes mellitus with hyperglycemia: Secondary | ICD-10-CM | POA: Diagnosis present

## 2020-07-30 DIAGNOSIS — R7989 Other specified abnormal findings of blood chemistry: Secondary | ICD-10-CM

## 2020-07-30 DIAGNOSIS — I214 Non-ST elevation (NSTEMI) myocardial infarction: Secondary | ICD-10-CM | POA: Diagnosis present

## 2020-07-30 DIAGNOSIS — J9621 Acute and chronic respiratory failure with hypoxia: Secondary | ICD-10-CM | POA: Diagnosis present

## 2020-07-30 DIAGNOSIS — E1122 Type 2 diabetes mellitus with diabetic chronic kidney disease: Secondary | ICD-10-CM | POA: Diagnosis present

## 2020-07-30 DIAGNOSIS — N1831 Chronic kidney disease, stage 3a: Secondary | ICD-10-CM

## 2020-07-30 DIAGNOSIS — I5023 Acute on chronic systolic (congestive) heart failure: Secondary | ICD-10-CM

## 2020-07-30 DIAGNOSIS — Z7189 Other specified counseling: Secondary | ICD-10-CM

## 2020-07-30 DIAGNOSIS — I25119 Atherosclerotic heart disease of native coronary artery with unspecified angina pectoris: Secondary | ICD-10-CM | POA: Diagnosis not present

## 2020-07-30 DIAGNOSIS — R778 Other specified abnormalities of plasma proteins: Secondary | ICD-10-CM

## 2020-07-30 DIAGNOSIS — Z8616 Personal history of COVID-19: Secondary | ICD-10-CM | POA: Diagnosis not present

## 2020-07-30 DIAGNOSIS — D649 Anemia, unspecified: Secondary | ICD-10-CM | POA: Diagnosis not present

## 2020-07-30 DIAGNOSIS — E669 Obesity, unspecified: Secondary | ICD-10-CM | POA: Diagnosis present

## 2020-07-30 DIAGNOSIS — K5900 Constipation, unspecified: Secondary | ICD-10-CM

## 2020-07-30 DIAGNOSIS — E1142 Type 2 diabetes mellitus with diabetic polyneuropathy: Secondary | ICD-10-CM | POA: Diagnosis present

## 2020-07-30 DIAGNOSIS — I5042 Chronic combined systolic (congestive) and diastolic (congestive) heart failure: Secondary | ICD-10-CM | POA: Diagnosis present

## 2020-07-30 DIAGNOSIS — K219 Gastro-esophageal reflux disease without esophagitis: Secondary | ICD-10-CM

## 2020-07-30 DIAGNOSIS — N400 Enlarged prostate without lower urinary tract symptoms: Secondary | ICD-10-CM

## 2020-07-30 DIAGNOSIS — N390 Urinary tract infection, site not specified: Secondary | ICD-10-CM | POA: Diagnosis present

## 2020-07-30 DIAGNOSIS — I25110 Atherosclerotic heart disease of native coronary artery with unstable angina pectoris: Secondary | ICD-10-CM | POA: Diagnosis not present

## 2020-07-30 DIAGNOSIS — E1151 Type 2 diabetes mellitus with diabetic peripheral angiopathy without gangrene: Secondary | ICD-10-CM | POA: Diagnosis present

## 2020-07-30 DIAGNOSIS — I70203 Unspecified atherosclerosis of native arteries of extremities, bilateral legs: Secondary | ICD-10-CM | POA: Diagnosis present

## 2020-07-30 DIAGNOSIS — Z6833 Body mass index (BMI) 33.0-33.9, adult: Secondary | ICD-10-CM | POA: Diagnosis not present

## 2020-07-30 DIAGNOSIS — I10 Essential (primary) hypertension: Secondary | ICD-10-CM | POA: Diagnosis not present

## 2020-07-30 LAB — URINALYSIS, ROUTINE W REFLEX MICROSCOPIC
Bacteria, UA: NONE SEEN
Bilirubin Urine: NEGATIVE
Glucose, UA: >=500 mg/dL — AB
Hgb urine dipstick: NEGATIVE
Ketones, ur: NEGATIVE mg/dL
Nitrite: NEGATIVE
Protein, ur: 100 mg/dL — AB
Specific Gravity, Urine: 1.014 (ref 1.005–1.030)
pH: 5 (ref 5.0–8.0)

## 2020-07-30 LAB — CBC WITH DIFFERENTIAL/PLATELET
Abs Immature Granulocytes: 0.04 10*3/uL (ref 0.00–0.07)
Basophils Absolute: 0 10*3/uL (ref 0.0–0.1)
Basophils Relative: 0 %
Eosinophils Absolute: 0.1 10*3/uL (ref 0.0–0.5)
Eosinophils Relative: 2 %
HCT: 23.5 % — ABNORMAL LOW (ref 39.0–52.0)
Hemoglobin: 7.2 g/dL — ABNORMAL LOW (ref 13.0–17.0)
Immature Granulocytes: 1 %
Lymphocytes Relative: 25 %
Lymphs Abs: 1.2 10*3/uL (ref 0.7–4.0)
MCH: 30.4 pg (ref 26.0–34.0)
MCHC: 30.6 g/dL (ref 30.0–36.0)
MCV: 99.2 fL (ref 80.0–100.0)
Monocytes Absolute: 0.2 10*3/uL (ref 0.1–1.0)
Monocytes Relative: 5 %
Neutro Abs: 3.2 10*3/uL (ref 1.7–7.7)
Neutrophils Relative %: 67 %
Platelets: 220 10*3/uL (ref 150–400)
RBC: 2.37 MIL/uL — ABNORMAL LOW (ref 4.22–5.81)
RDW: 14.3 % (ref 11.5–15.5)
WBC: 4.7 10*3/uL (ref 4.0–10.5)
nRBC: 0 % (ref 0.0–0.2)

## 2020-07-30 LAB — COMPREHENSIVE METABOLIC PANEL
ALT: 12 U/L (ref 0–44)
AST: 14 U/L — ABNORMAL LOW (ref 15–41)
Albumin: 2.7 g/dL — ABNORMAL LOW (ref 3.5–5.0)
Alkaline Phosphatase: 54 U/L (ref 38–126)
Anion gap: 3 — ABNORMAL LOW (ref 5–15)
BUN: 21 mg/dL (ref 8–23)
CO2: 34 mmol/L — ABNORMAL HIGH (ref 22–32)
Calcium: 8.4 mg/dL — ABNORMAL LOW (ref 8.9–10.3)
Chloride: 95 mmol/L — ABNORMAL LOW (ref 98–111)
Creatinine, Ser: 1.58 mg/dL — ABNORMAL HIGH (ref 0.61–1.24)
GFR, Estimated: 48 mL/min — ABNORMAL LOW (ref >60)
Glucose, Bld: 325 mg/dL — ABNORMAL HIGH (ref 70–99)
Potassium: 3.6 mmol/L (ref 3.5–5.1)
Sodium: 132 mmol/L — ABNORMAL LOW (ref 135–145)
Total Bilirubin: 0 mg/dL — ABNORMAL LOW (ref 0.3–1.2)
Total Protein: 10.4 g/dL — ABNORMAL HIGH (ref 6.5–8.1)

## 2020-07-30 LAB — PROCALCITONIN: Procalcitonin: <0.1 ng/mL

## 2020-07-30 LAB — MAGNESIUM: Magnesium: 1.9 mg/dL (ref 1.7–2.4)

## 2020-07-30 LAB — LIPASE, BLOOD: Lipase: 20 U/L (ref 11–51)

## 2020-07-30 LAB — LACTIC ACID, PLASMA
Lactic Acid, Venous: 1.2 mmol/L (ref 0.5–1.9)
Lactic Acid, Venous: 0.9 mmol/L (ref 0.5–1.9)

## 2020-07-30 LAB — GLUCOSE, CAPILLARY: Glucose-Capillary: 167 mg/dL — ABNORMAL HIGH (ref 70–99)

## 2020-07-30 LAB — BRAIN NATRIURETIC PEPTIDE: B Natriuretic Peptide: 313 pg/mL — ABNORMAL HIGH (ref 0.0–100.0)

## 2020-07-30 LAB — PREPARE RBC (CROSSMATCH)

## 2020-07-30 LAB — TROPONIN I (HIGH SENSITIVITY)
Troponin I (High Sensitivity): 812 ng/L (ref <18)
Troponin I (High Sensitivity): 1116 ng/L (ref <18)
Troponin I (High Sensitivity): 1539 ng/L (ref <18)

## 2020-07-30 LAB — PHOSPHORUS: Phosphorus: 4.5 mg/dL (ref 2.5–4.6)

## 2020-07-30 LAB — CBG MONITORING, ED
Glucose-Capillary: 170 mg/dL — ABNORMAL HIGH (ref 70–99)
Glucose-Capillary: 205 mg/dL — ABNORMAL HIGH (ref 70–99)

## 2020-07-30 LAB — D-DIMER, QUANTITATIVE: D-Dimer, Quant: 1.31 ug/mL-FEU — ABNORMAL HIGH (ref 0.00–0.50)

## 2020-07-30 MED ORDER — NITROGLYCERIN IN D5W 200-5 MCG/ML-% IV SOLN
INTRAVENOUS | Status: AC
  Filled 2020-07-30: qty 250

## 2020-07-30 MED ORDER — FUROSEMIDE 10 MG/ML IJ SOLN
40.0000 mg | Freq: Once | INTRAMUSCULAR | Status: AC
  Administered 2020-07-30: 40 mg via INTRAVENOUS
  Filled 2020-07-30: qty 4

## 2020-07-30 MED ORDER — MORPHINE SULFATE (PF) 2 MG/ML IV SOLN
2.0000 mg | INTRAVENOUS | Status: DC | PRN
  Administered 2020-07-31: 2 mg via INTRAVENOUS
  Filled 2020-07-30 (×2): qty 1

## 2020-07-30 MED ORDER — GLUCERNA SHAKE PO LIQD
237.0000 mL | Freq: Three times a day (TID) | ORAL | Status: DC
  Administered 2020-07-31 – 2020-08-02 (×8): 237 mL via ORAL
  Filled 2020-07-30 (×5): qty 237

## 2020-07-30 MED ORDER — CARVEDILOL 25 MG PO TABS
25.0000 mg | ORAL_TABLET | Freq: Two times a day (BID) | ORAL | Status: DC
  Administered 2020-07-30 – 2020-08-02 (×8): 25 mg via ORAL
  Filled 2020-07-30 (×2): qty 1
  Filled 2020-07-30: qty 2
  Filled 2020-07-30 (×6): qty 1

## 2020-07-30 MED ORDER — INSULIN GLARGINE 100 UNIT/ML ~~LOC~~ SOLN
8.0000 [IU] | Freq: Every day | SUBCUTANEOUS | Status: DC
  Administered 2020-07-30 – 2020-07-31 (×2): 8 [IU] via SUBCUTANEOUS
  Filled 2020-07-30 (×4): qty 0.08

## 2020-07-30 MED ORDER — ISOSORBIDE MONONITRATE ER 60 MG PO TB24: 180.0000 mg | ORAL_TABLET | Freq: Every day | ORAL | Status: DC

## 2020-07-30 MED ORDER — INSULIN ASPART 100 UNIT/ML IJ SOLN
0.0000 [IU] | Freq: Three times a day (TID) | INTRAMUSCULAR | Status: DC
  Administered 2020-07-30: 2 [IU] via SUBCUTANEOUS
  Administered 2020-07-31: 7 [IU] via SUBCUTANEOUS
  Administered 2020-07-31 – 2020-08-01 (×3): 3 [IU] via SUBCUTANEOUS
  Administered 2020-08-01: 2 [IU] via SUBCUTANEOUS
  Administered 2020-08-01: 9 [IU] via SUBCUTANEOUS
  Administered 2020-08-02: 3 [IU] via SUBCUTANEOUS
  Administered 2020-08-02: 2 [IU] via SUBCUTANEOUS
  Administered 2020-08-02: 7 [IU] via SUBCUTANEOUS
  Filled 2020-07-30 (×2): qty 1

## 2020-07-30 MED ORDER — ASPIRIN EC 81 MG PO TBEC
81.0000 mg | DELAYED_RELEASE_TABLET | Freq: Every day | ORAL | Status: DC
  Administered 2020-07-30 – 2020-08-02 (×4): 81 mg via ORAL
  Filled 2020-07-30 (×4): qty 1

## 2020-07-30 MED ORDER — FUROSEMIDE 10 MG/ML IJ SOLN
20.0000 mg | Freq: Once | INTRAMUSCULAR | Status: AC
  Administered 2020-07-30: 20 mg via INTRAVENOUS
  Filled 2020-07-30: qty 2

## 2020-07-30 MED ORDER — TAMSULOSIN HCL 0.4 MG PO CAPS
0.4000 mg | ORAL_CAPSULE | Freq: Every day | ORAL | Status: DC
  Administered 2020-07-30 – 2020-08-02 (×4): 0.4 mg via ORAL
  Filled 2020-07-30 (×4): qty 1

## 2020-07-30 MED ORDER — NITROGLYCERIN IN D5W 200-5 MCG/ML-% IV SOLN
0.0000 ug/min | INTRAVENOUS | Status: DC
  Administered 2020-07-30: 30 ug/min via INTRAVENOUS

## 2020-07-30 MED ORDER — CARVEDILOL 25 MG PO TABS
25.0000 mg | ORAL_TABLET | Freq: Two times a day (BID) | ORAL | Status: DC
  Administered 2020-07-30: 25 mg via ORAL

## 2020-07-30 MED ORDER — SODIUM CHLORIDE 0.9% IV SOLUTION: Freq: Once | INTRAVENOUS | Status: AC

## 2020-07-30 MED ORDER — POLYETHYLENE GLYCOL 3350 17 G PO PACK
17.0000 g | PACK | Freq: Every day | ORAL | Status: DC
  Administered 2020-07-30 – 2020-08-02 (×4): 17 g via ORAL
  Filled 2020-07-30 (×4): qty 1

## 2020-07-30 MED ORDER — ISOSORBIDE MONONITRATE ER 60 MG PO TB24: 120.0000 mg | ORAL_TABLET | Freq: Every day | ORAL | Status: DC

## 2020-07-30 MED ORDER — MELATONIN 3 MG PO TABS
6.0000 mg | ORAL_TABLET | Freq: Every day | ORAL | Status: DC
  Administered 2020-07-30 – 2020-08-01 (×3): 6 mg via ORAL
  Filled 2020-07-30 (×3): qty 2

## 2020-07-30 MED ORDER — NITROGLYCERIN 0.4 MG SL SUBL: 0.4000 mg | SUBLINGUAL_TABLET | SUBLINGUAL | Status: DC | PRN

## 2020-07-30 MED ORDER — ASPIRIN 81 MG PO CHEW
324.0000 mg | CHEWABLE_TABLET | Freq: Once | ORAL | Status: AC
  Administered 2020-07-30: 324 mg via ORAL
  Filled 2020-07-30: qty 4

## 2020-07-30 MED ORDER — PANTOPRAZOLE SODIUM 40 MG PO TBEC
40.0000 mg | DELAYED_RELEASE_TABLET | Freq: Two times a day (BID) | ORAL | Status: DC
  Administered 2020-07-30 – 2020-08-02 (×7): 40 mg via ORAL
  Filled 2020-07-30 (×7): qty 1

## 2020-07-30 MED ORDER — IPRATROPIUM-ALBUTEROL 0.5-2.5 (3) MG/3ML IN SOLN: 3.0000 mL | Freq: Four times a day (QID) | RESPIRATORY_TRACT | Status: DC | PRN

## 2020-07-30 MED ORDER — FUROSEMIDE 80 MG PO TABS: 80.0000 mg | ORAL_TABLET | Freq: Every day | ORAL | Status: DC

## 2020-07-30 MED ORDER — ISOSORBIDE MONONITRATE ER 60 MG PO TB24
180.0000 mg | ORAL_TABLET | Freq: Every day | ORAL | Status: DC
  Administered 2020-07-31 – 2020-08-02 (×3): 180 mg via ORAL
  Filled 2020-07-30 (×3): qty 3

## 2020-07-30 MED ORDER — BISACODYL 10 MG RE SUPP
10.0000 mg | Freq: Once | RECTAL | Status: AC
  Administered 2020-07-30: 10 mg via RECTAL
  Filled 2020-07-30: qty 1

## 2020-07-30 MED ORDER — ROSUVASTATIN CALCIUM 20 MG PO TABS
20.0000 mg | ORAL_TABLET | Freq: Every day | ORAL | Status: DC
  Administered 2020-07-30 – 2020-08-01 (×3): 20 mg via ORAL
  Filled 2020-07-30 (×3): qty 1

## 2020-07-30 MED ORDER — POLYSACCHARIDE IRON COMPLEX 150 MG PO CAPS
150.0000 mg | ORAL_CAPSULE | Freq: Two times a day (BID) | ORAL | Status: DC
  Administered 2020-07-30 – 2020-08-02 (×6): 150 mg via ORAL
  Filled 2020-07-30 (×7): qty 1

## 2020-07-30 MED ORDER — VITAMIN B-12 100 MCG PO TABS
500.0000 ug | ORAL_TABLET | Freq: Every day | ORAL | Status: DC
  Administered 2020-07-30 – 2020-08-02 (×4): 500 ug via ORAL
  Filled 2020-07-30: qty 1
  Filled 2020-07-30 (×3): qty 5

## 2020-07-30 MED ORDER — HYDRALAZINE HCL 50 MG PO TABS
50.0000 mg | ORAL_TABLET | Freq: Every day | ORAL | Status: DC
  Administered 2020-07-30: 50 mg via ORAL
  Filled 2020-07-30: qty 2

## 2020-07-30 MED ORDER — NITROGLYCERIN 0.4 MG SL SUBL
SUBLINGUAL_TABLET | SUBLINGUAL | Status: AC
  Filled 2020-07-30: qty 1

## 2020-07-30 MED ORDER — INSULIN ASPART 100 UNIT/ML IJ SOLN
0.0000 [IU] | Freq: Every day | INTRAMUSCULAR | Status: DC
  Administered 2020-07-31 – 2020-08-01 (×2): 3 [IU] via SUBCUTANEOUS

## 2020-07-30 MED ORDER — CLOPIDOGREL BISULFATE 75 MG PO TABS
75.0000 mg | ORAL_TABLET | Freq: Every day | ORAL | Status: DC
  Administered 2020-07-30 – 2020-08-02 (×4): 75 mg via ORAL
  Filled 2020-07-30 (×4): qty 1

## 2020-07-30 NOTE — Progress Notes (Signed)
RT placed patient on bipap per MD order with MD, rapid response RN, and RN. Patient seems comfortable on bipap at this time. RT will continue to monitor.

## 2020-07-30 NOTE — Progress Notes (Signed)
Patient seen and evaluated, chart reviewed, please see EMR for updated orders. Please see full H&P dictated by admitting physician Dr. Josephine Cables for same date of service.   Brief Summary:- 70 y.o. male with medical history significant for  chronic blood loss anemia, asthma, COPD, BPH, CAD, chronic diastolic heart failure, history of CVA, hypertension, GERD, hyperlipidemia, osteomyelitis, PAD with bilateral BKA, type II DM with diabetic peripheral neuropathy, chronic respiratory failure on 3 LPM of oxygen via [place] at baseline admitted on 07/30/2020 with acute on chronic hypoxic respiratory failure in the setting of NSTEMI  A/p 1)Severe, complex CAD--NSTEMI-EKG without acute ST segment changes -Troponin 812>>> 1,116 Patient has history of extensive CAD with his most recent LHC on 03/21/20 showed  70% distal left main stenosis as well as ostial LAD stenosis.  He also had 99% circumflex stenosis and 40% proximal RCA stenosis.  Medical management was recommended at that time as patient was deemed not to be a candidate for PCI or CABG due to comorbid conditions -Cardiology input appreciated -Continue aspirin, Coreg, Plavix, isosorbide and Crestor  2)HFrEF--patient with chronic systolic dysfunction CHF with EF in the 40 to 45% range -BNP 313 -Clinically and radiologically no acute CHF exacerbation at this time continue PTA Lasix dose of 80 mg daily  3) elevated D-dimer----VQ scan requested  4)Possible PNA--chest x-ray with patchy retrocardiac consolidation which could be bronchopneumonia however patient has no productive cough, fevers leukocytosis and PCT is less than 0.1 -Hold off on antibiotics at this time  5)DM2- A1C is 7.4 reflecting uncontrolled DM with hyperglycemia, Use Novolog/Humalog Sliding scale insulin with Accu-Cheks/Fingersticks as ordered   6)PAD-- s/p Bil BKA, c/n aspirin, plavix and crestor  7)CKD 3B- renal function is close to baseline  renally adjust medications, avoid nephrotoxic  agents / dehydration  / hypotension  8) acute on chronic anemia--- patient with shortness of breath and chest pain, so transfuse 1 unit of PRBC on 07/30/20 -Continue iron supplementation -Baseline hemoglobin usually above 8 presented with hemoglobin of 7.2 -Prior EGD and capsule endoscopy without significant findings  9)Social/Ethics--patient is from [facility], previously DNR, patient today requesting full CODE STATUS -Palliative care consult for goals of care and advanced directives discussion requested  -[facility] was transferred to [name] for [facility]ology evaluation  -Total care time over 47 minutes - Patient seen and evaluated, chart reviewed, please see EMR for updated orders. Please see full H&P dictated by admitting physician Dr. Josephine Cables for same date of service.

## 2020-07-30 NOTE — Significant Event (Signed)
Paged by nursing staff that Mr. Schauer is in acute respiratory distress with chest discomfort, diaphoretic. Significant CAD without revasc options. On assessment sBP 200s, sinus tach 130s, elevated hsT. He had received coreg 25 mg PO bid today, recent dose. On hydral but only scheduled for hydral 50 mg PO daily (LD this AM). Already on lasix with additional dose given. Gave 2 SLNG, ordered nitro gtt, started at 20 and uptitrated to 50 mcg, gave additional coreg 25 mg PO x1 to increase to coreg 50 mg PO bid. Started on BiPAP after. Tx for HTN urgency. Already on imdur 180 mg PO daily. Would schedule hydral 50 mg PO to tid and increase coreg to 50 mg PO bid then wean off nitro gtt and BiPAP. sBP 180s when hospitalist arrived and took over care.

## 2020-07-30 NOTE — ED Notes (Signed)
Date and time results received: 07/30/20 0325 -  Test: Troponin Critical Value: 812  Name of Provider Notified: Dr. Wyvonnia Dusky  Orders Received? Or Actions Taken?: Repeat EKG

## 2020-07-30 NOTE — ED Provider Notes (Signed)
[facility] EMERGENCY DEPARTMENT Provider Note   CSN: [id number] Arrival date & time: 07/29/20  2353     History Chief Complaint  Patient presents with   Shortness of Breath    Cory Weber is a 70 y.o. male.  Patient with a history of bilateral BKA's, diabetes, COPD, CKD, diastolic heart failure, CAD, home oxygen use presenting with shortness of breath.  Apparently got acute onset shortness of breath while at his living facility earlier tonight.  He is on 3 L all the time.  On EMS arrival he was diaphoretic with O2 sats in the 86 range.  EMS placed him on nonrebreather and his saturations improved to 100%.  He is no longer diaphoretic is now on 4 L saturating high 90s. Patient states he feels like he is breathing better.  Did have some central chest pain earlier when he was having trouble breathing which has since resolved.  States he is coughing up clear mucus. Complains of diffuse abdominal pain but no nausea or vomiting.  No chest pain currently.  No fever. Unknown if he has any leg swelling due to his amputations. Feels like his breathing is back to baseline.  States compliance with his medications  The history is provided by the patient and the EMS personnel. The history is limited by the condition of the patient.  Shortness of Breath Associated symptoms: abdominal pain, cough and diaphoresis   Associated symptoms: no chest pain, no fever, no headaches and no rash       Past Medical History:  Diagnosis Date   Acute anemia 06/01/2020   Anemia    Asthma    BPH (benign prostatic hyperplasia)    CAD (coronary artery disease)    Severe multivessel disease 03/2020 - poor revascularization options   Chronic diastolic heart failure (HCC)    CKD (chronic kidney disease) stage 3, GFR 30-59 ml/min (HCC)    COPD (chronic obstructive pulmonary disease) (HCC)    CVA (cerebral vascular accident) ([place]) 2015   Essential hypertension    Gangrene ([place]) 06/10/2016   GERD  (gastroesophageal reflux disease)    Hepatitis C    History of pneumonia    Hyperlipidemia    Osteomyelitis (HCC)    PAD (peripheral artery disease) (HCC)    Bilateral BKA   Peripheral neuropathy    Type II diabetes mellitus ([place])     Patient Active Problem List   Diagnosis Date Noted   Elevated PSA 07/25/2020   Urinary retention 07/25/2020   Chronic respiratory failure with hypoxia (HCC)    Symptomatic anemia 06/23/2020   COPD with acute exacerbation ([place]) 06/10/2020   Acute respiratory failure with hypoxia and hypercarbia ([place]) 06/10/2020   COPD (chronic obstructive pulmonary disease) (HCC)    Acute on chronic anemia 06/01/2020   Class 2 obesity due to excess calories with body mass index (BMI) of 35.0 to 35.9 in adult    Acute on chronic combined systolic and diastolic congestive heart failure ([place]) 04/27/2020   AVM (arteriovenous malformation) of small bowel, acquired    Gastritis and gastroduodenitis    Gastric diverticulum    Grade I internal hemorrhoids    Occult blood in stools    Heart failure ([place]) 03/20/2020   NSTEMI (non-ST elevated myocardial infarction) ([place]) 03/20/2020   Pneumonia due to COVID-19 virus 02/24/2019   Acute on chronic respiratory failure with hypoxia ([place]) 02/20/2019   COVID-19 virus detected 02/20/2019   DNR (do not resuscitate) 02/20/2019   PAD (peripheral artery disease) (  [place]) 08/22/2016   Osteomyelitis of foot ([place]) 07/08/2016   S/P transmetatarsal amputation of foot, right ([place]) 06/26/2016   Constipation due to opioid therapy 06/15/2016   Physical deconditioning 06/10/2016   Chronic diastolic CHF (congestive heart failure) ([place]) 06/10/2016   Ischemic pain of right foot 06/04/2016   Ischemia of foot 05/31/2016   Chronic gout due to renal impairment involving toe of right foot without tophus 02/05/2016   Type 2 diabetes with nephropathy ([place]) 01/21/2016   Uncontrolled type II diabetes with peripheral autonomic neuropathy ([place]) 01/17/2016    Edema of right lower extremity 12/20/2015   Cough 11/26/2015   GERD (gastroesophageal reflux disease) 10/08/2015   Amputation of left lower extremity below knee ([place]) 08/21/2015   Phantom limb pain (HCC)    Anemia of chronic disease    HLD (hyperlipidemia)    BPH (benign prostatic hyperplasia)    ETOH abuse    PVD (peripheral vascular disease) ([place])    Chronic hepatitis C without hepatic coma ([place])    History of CVA (cerebrovascular accident) without residual deficits    Hyponatremia    Chronic kidney disease, stage 3 ([place]) 07/18/2015   Essential hypertension 07/18/2015   Acute blood loss anemia 12/01/2013   S/P lumbar spinal fusion 11/25/2013   Melena 12/03/2011   Hemiparesthesia 02/03/2011   Polysubstance abuse ([place]) 02/03/2011   Tobacco abuse 02/03/2011    Past Surgical History:  Procedure Laterality Date   ABDOMINAL AORTOGRAM N/A 06/03/2016   Procedure: Abdominal Aortogram;  Surgeon: Waynetta Sandy, MD;  Location: [place] CV [facility];  Service: Cardiovascular;  Laterality: N/A;   AMPUTATION Left 08/16/2015   Procedure: LEFT BELOW THE KNEE AMPUTATION ;  Surgeon: Serafina Mitchell, MD;  Location: [place];  Service: Vascular;  Laterality: Left;   AMPUTATION Right 08/22/2016   Procedure: AMPUTATION BELOW KNEE-RIGHT;  Surgeon: Waynetta Sandy, MD;  Location: [place];  Service: Vascular;  Laterality: Right;   BACK SURGERY     BELOW KNEE LEG AMPUTATION Left 08/16/2015   BIOPSY N/A 09/03/2012   Procedure: BIOPSY;  Surgeon: Daneil Dolin, MD;  [facility];  Service: Endoscopy;  Laterality: N/A;  gastric and gastric mucosa   BIOPSY N/A 12/03/2012   Procedure: BIOPSY;  Surgeon: Daneil Dolin, MD;  [facility]: [facility];  Service: Endoscopy;  Laterality: N/A;   BIOPSY  03/31/2020   Procedure: BIOPSY;  Surgeon: Ladene Artist, MD;  Location: [place];  Service: Endoscopy;;   COLONOSCOPY WITH PROPOFOL N/A 09/03/2012   JAS:[id number] polyp-removed as outlined above. Prominent  internal hemorrhoids. Tubular adenoma   COLONOSCOPY WITH PROPOFOL N/A 04/05/2020   Procedure: COLONOSCOPY WITH PROPOFOL;  Surgeon: Lavena Bullion, DO;  Location: [place];  Service: Gastroenterology;  Laterality: N/A;   ENTEROSCOPY Left 04/05/2020   Procedure: ENTEROSCOPY;  Surgeon: Lavena Bullion, DO;  Location: [place];  Service: Gastroenterology;  Laterality: Left;   ENTEROSCOPY N/A 04/07/2020   Procedure: ENTEROSCOPY;  Surgeon: Lavena Bullion, DO;  Location: [place];  Service: Gastroenterology;  Laterality: N/A;   ESOPHAGOGASTRODUODENOSCOPY (EGD) WITH PROPOFOL N/A 09/03/2012   BHA:LPFXTK hernia. Gastric diverticulum. Gastric ulcers with associated erosions. Duodenal erosions. Status post gastric biopsy. H.PYLORI gastritis    ESOPHAGOGASTRODUODENOSCOPY (EGD) WITH PROPOFOL N/A 12/03/2012   Dr. Gala Romney: gastric diverticulum, gastric erosions and scar. Previously noted gastric ulcer completed healed. Biopsy without H.pylori.    ESOPHAGOGASTRODUODENOSCOPY (EGD) WITH PROPOFOL N/A 01/23/2016   Procedure: ESOPHAGOGASTRODUODENOSCOPY (EGD) WITH PROPOFOL;  Surgeon: Doran Stabler, MD;  [facility];  Service: Gastroenterology;  Laterality: N/A;   ESOPHAGOGASTRODUODENOSCOPY (EGD) WITH PROPOFOL N/A 03/31/2020   Procedure: ESOPHAGOGASTRODUODENOSCOPY (EGD) WITH PROPOFOL;  Surgeon: Ladene Artist, MD;  Location: [facility] ENDOSCOPY;  Service: Endoscopy;  Laterality: N/A;   ESOPHAGOGASTRODUODENOSCOPY (EGD) WITH PROPOFOL N/A 06/02/2020   Procedure: ESOPHAGOGASTRODUODENOSCOPY (EGD) WITH PROPOFOL;  Surgeon: Eloise Harman, DO;  [facility];  Service: Endoscopy;  Laterality: N/A;   GIVENS CAPSULE STUDY N/A 04/05/2020   Procedure: GIVENS CAPSULE STUDY;  Surgeon: Lavena Bullion, DO;  Location: [place];  Service: Gastroenterology;  Laterality: N/A;   HOT HEMOSTASIS N/A 04/07/2020   Procedure: HOT HEMOSTASIS (ARGON PLASMA COAGULATION/BICAP);  Surgeon: Lavena Bullion, DO;  Location: [facility] ENDOSCOPY;  Service: Gastroenterology;  Laterality: N/A;   I & D EXTREMITY Right 07/11/2016   Procedure: DELAY PRIMARY CLOSURE FOOT AMPUTATION;  Surgeon: Edrick Kins, DPM;  Location: [place];  Service: Podiatry;  Laterality: Right;   LEFT HEART CATH AND CORONARY ANGIOGRAPHY N/A 03/21/2020   Procedure: LEFT HEART CATH AND CORONARY ANGIOGRAPHY;  Surgeon: Martinique, Peter M, MD;  Location: [place] CV LAB;  Service: Cardiovascular;  Laterality: N/A;   LIVER BIOPSY  2005   Done in [place], [place]. Chronic hepatitis with mild periportal inflammation, lobular unicellular necrosis and portal fibrosis. Grade 2, stage 1-2.   LOWER EXTREMITY ANGIOGRAPHY Right 06/03/2016   Procedure: Lower Extremity Angiography;  Surgeon: Waynetta Sandy, MD;  Location: [place] CV LAB;  Service: Cardiovascular;  Laterality: Right;   MAXIMUM ACCESS (MAS)POSTERIOR LUMBAR INTERBODY FUSION (PLIF) 1 LEVEL N/A 11/25/2013   Procedure: FOR MAXIMUM ACCESS (MAS) POSTERIOR LUMBAR INTERBODY FUSION (PLIF) 1 LEVEL;  Surgeon: Eustace Moore, MD;  Location: [place] NEURO ORS;  Service: Neurosurgery;  Laterality: N/A;  FOR MAXIMUM ACCESS (MAS) POSTERIOR LUMBAR INTERBODY FUSION (PLIF) 1 LEVEL LUMBAR 3-4   PERIPHERAL VASCULAR ATHERECTOMY Right 06/03/2016   Procedure: Peripheral Vascular Atherectomy;  Surgeon: Waynetta Sandy, MD;  Location: [place] CV LAB;  Service: Cardiovascular;  Laterality: Right;  SFA WITH STENT   PERIPHERAL VASCULAR CATHETERIZATION Left 08/01/2015   Procedure: Lower Extremity Angiography;  Surgeon: Serafina Mitchell, MD;  Location: [place] CV LAB;  Service: Cardiovascular;  Laterality: Left;   PERIPHERAL VASCULAR CATHETERIZATION N/A 08/01/2015   Procedure: Abdominal Aortogram;  Surgeon: Serafina Mitchell, MD;  Location: [place] [facility];  Service: Cardiovascular;  Laterality: N/A;   PERIPHERAL VASCULAR CATHETERIZATION N/A 08/08/2015   Procedure: Abdominal Aortogram w/Lower Extremity;   Surgeon: Serafina Mitchell, MD;  Location: [place] CV LAB;  Service: Cardiovascular;  Laterality: N/A;   PERIPHERAL VASCULAR CATHETERIZATION Left 08/08/2015   Procedure: Peripheral Vascular Balloon Angioplasty;  Surgeon: Serafina Mitchell, MD;  Location: [place] [facility];  Service: Cardiovascular;  Laterality: Left;  left popiteal artery, left peronealtrunk, left post tibial   POLYPECTOMY N/A 09/03/2012   Procedure: POLYPECTOMY;  Surgeon: Daneil Dolin, MD;  [facility];  Service: Endoscopy;  Laterality: N/A;  cecal polyp   SUBMUCOSAL TATTOO INJECTION  04/07/2020   Procedure: SUBMUCOSAL TATTOO INJECTION;  Surgeon: Lavena Bullion, DO;  Location: [place] ENDOSCOPY;  Service: Gastroenterology;;   TONSILLECTOMY     TRANSMETATARSAL AMPUTATION Right 06/13/2016   Procedure: RIGHT TRANSMETATARSAL AMPUTATION;  Surgeon: Waynetta Sandy, MD;  Location: [place];  Service: Vascular;  Laterality: Right;   TRANSMETATARSAL AMPUTATION Right 07/10/2016   Procedure: REVISION RIGHT TRANSMETATARSAL AMPUTATION;  Surgeon: Waynetta Sandy, MD;  Location: [place];  Service:  Vascular;  Laterality: Right;       Family History  Problem Relation Age of Onset   Breast cancer Mother    Heart failure Mother    Diabetes Father    Hypertension Father    Heart disease Father    Hyperlipidemia Father    Heart failure Father    Diabetes Sister    Hypertension Sister    Breast cancer Sister    Colon cancer Neg Hx    Liver disease Neg Hx     Social History   Tobacco Use   Smoking status: Former    Packs/day: 1.00    Years: 47.00    Pack years: 47.00    Types: Cigarettes    Quit date: 07/13/2015    Years since quitting: 5.0   Smokeless tobacco: Never  Vaping Use   Vaping Use: Never used  Substance Use Topics   Alcohol use: No    Alcohol/week: 0.0 standard drinks    Comment:  none 08/15/2015 "stopped drinking 3-4 years ago"   Drug use: No    Comment: 08/15/2015 "last used cocaine 3-4 months ago"     Home Medications Prior to Admission medications   Medication Sig Start Date End Date Taking? Authorizing Provider  acetaminophen (TYLENOL) 325 MG tablet Take 2 tablets (650 mg total) by mouth every 6 (six) hours as needed for mild pain or headache (fever >/= 101). 02/24/19   Roxan Hockey, MD  aspirin EC 81 MG tablet Take 1 tablet (81 mg total) by mouth daily with breakfast. 02/24/19   Roxan Hockey, MD  baclofen (LIORESAL) 10 MG tablet Take 1 tablet (10 mg total) by mouth every 8 (eight) hours as needed for muscle spasms. For shoulder spasm 06/03/20   Manuella Ghazi, Pratik D, DO  carvedilol (COREG) 25 MG tablet Take 1 tablet (25 mg total) by mouth 2 (two) times daily with a meal. 04/08/20   Regalado, Belkys A, MD  cloNIDine (CATAPRES) 0.1 MG tablet Take 0.1 mg by mouth at bedtime.    [provider]  clopidogrel (PLAVIX) 75 MG tablet Take 75 mg by mouth daily.    [provider]  DULoxetine (CYMBALTA) 60 MG capsule Take 60 mg by mouth daily.    [provider]  ferrous gluconate (FERGON) 324 MG tablet Take 324 mg by mouth daily with breakfast.    [provider]  furosemide (LASIX) 20 MG tablet Take 80 mg by mouth daily. 06/09/20   [provider]  gabapentin (NEURONTIN) 300 MG capsule Take 300 mg by mouth 3 (three) times daily.    [provider]  glycerin adult 2 g suppository Place 1 suppository rectally as needed for constipation. 07/04/20   Tacy Learn, PA-C  hydrALAZINE (APRESOLINE) 50 MG tablet Take 1 tablet (50 mg total) by mouth daily. 06/24/20   Barton Dubois, MD  HYDROcodone-acetaminophen (NORCO/VICODIN) 5-325 MG tablet Take 1 tablet by mouth 4 (four) times daily as needed. 06/26/20   [provider]  insulin aspart (NOVOLOG) 100 UNIT/ML injection Inject into the skin. 07/05/20   [provider]  insulin glargine (LANTUS) 100 UNIT/ML injection Inject 0.18 mLs (18 Units total) into the skin at bedtime. 06/24/20    Barton Dubois, MD  insulin lispro (HUMALOG) 100 UNIT/ML injection Inject 0.05 mLs (5 Units total) into the skin in the morning, at noon, and at bedtime. Give only if eats 50% or more of meal. Patient taking differently: Inject 8 Units into the skin in the morning, at  noon, and at bedtime. Give only if eats 50% or more of meal. 06/24/20   Barton Dubois, MD  ipratropium-albuterol (DUONEB) 0.5-2.5 (3) MG/3ML SOLN Take 3 mLs by nebulization every 6 (six) hours as needed (SOB/ wheezing).    [provider]  iron polysaccharides (NIFEREX) 150 MG capsule Take 1 capsule (150 mg total) by mouth 2 (two) times daily. 06/24/20   Barton Dubois, MD  isosorbide mononitrate (IMDUR) 120 MG 24 hr tablet Take 1 tablet (120 mg total) by mouth daily. 04/08/20   Regalado, Belkys A, MD  Lidocaine 4 % PTCH Apply 1 patch topically daily. Apply 1 patch to both stumps once daily, 12 hour use    [provider]  loratadine (CLARITIN) 10 MG tablet Take 10 mg by mouth daily.    [provider]  Melatonin 5 MG TABS Take 1 tablet by mouth at bedtime.    [provider]  NOVOLOG FLEXPEN 100 UNIT/ML FlexPen Inject into the skin. 07/18/20   [provider]  omeprazole (PRILOSEC) 20 MG capsule Take 1 capsule by mouth 2 (two) times daily. 07/03/20   [provider]  pantoprazole (PROTONIX) 40 MG tablet Take 1 tablet (40 mg total) by mouth 2 (two) times daily. 06/24/20   Barton Dubois, MD  polyethylene glycol (MIRALAX) 17 g packet Take 17 g by mouth daily. 07/04/20   Tacy Learn, PA-C  rosuvastatin (CRESTOR) 20 MG tablet Take 1 tablet (20 mg total) by mouth daily. Patient taking differently: Take 20 mg by mouth at bedtime. 04/08/20   Regalado, Belkys A, MD  senna (SENOKOT) 8.6 MG tablet Take 1 tablet by mouth 2 (two) times daily.    [provider]  sodium phosphate (FLEET) 7-19 GM/118ML ENEM Place 1 enema rectally daily as needed for severe constipation.    [provider]  tamsulosin (FLOMAX) 0.4 MG CAPS capsule Take 1 capsule (0.4 mg total) by mouth 2 (two) times daily. 07/25/20   McKenzie, Candee Furbish, MD  vitamin B-12 (CYANOCOBALAMIN) 500 MCG tablet Take 500 mcg by mouth daily.     [provider]    Allergies    No known allergies  Review of Systems   Review of Systems  Constitutional:  Positive for diaphoresis. Negative for activity change, appetite change and fever.  HENT:  Negative for congestion and rhinorrhea.   Respiratory:  Positive for cough, chest tightness and shortness of breath.   Cardiovascular:  Positive for leg swelling. Negative for chest pain.  Gastrointestinal:  Positive for abdominal pain.  Genitourinary:  Negative for dysuria.  Musculoskeletal:  Negative for arthralgias and myalgias.  Skin:  Negative for rash.  Neurological:  Positive for weakness. Negative for dizziness and headaches.   all other systems are negative except as noted in the HPI and PMH.   Physical Exam Updated Vital Signs BP (!) 162/84   Pulse (!) 115   Temp 98.4 F (36.9 C)   Resp 17   Ht 6\' 1"  (1.854 m)   Wt 114 kg   SpO2 100%   BMI 33.16 kg/m   Physical Exam Vitals and nursing note reviewed.  Constitutional:      General: He is in acute distress.     Appearance: He is well-developed. He is obese. He is ill-appearing.     Comments: Tachypnea with conversation, obese, chronically ill appearing  HENT:     Head: Normocephalic and atraumatic.     Mouth/Throat:     Pharynx: No oropharyngeal exudate.  Eyes:     Conjunctiva/sclera: Conjunctivae normal.     Pupils: Pupils are equal, round, and reactive to light.  Neck:     Comments: No meningismus. Cardiovascular:     Rate and Rhythm: Regular rhythm. Tachycardia present.     Heart sounds: Normal heart sounds. No murmur heard. Pulmonary:     Effort: Respiratory distress present.     Breath sounds: Rales present.  Abdominal:     Palpations: Abdomen is soft.     Tenderness:  There is abdominal tenderness. There is no guarding or rebound.     Comments: Diffuse tenderness. No guarding or rebound  Musculoskeletal:        General: No tenderness. Normal range of motion.     Cervical back: Normal range of motion and neck supple.     Comments: Bilateral BKA  Skin:    General: Skin is warm.  Neurological:     General: No focal deficit present.     Mental Status: He is alert and oriented to person, place, and time. Mental status is at baseline.     Cranial Nerves: No cranial nerve deficit.     Motor: No abnormal muscle tone.     Coordination: Coordination normal.     Comments:  5/5 strength throughout. CN 2-12 intact.Equal grip strength.   Psychiatric:        Behavior: Behavior normal.    ED Results / Procedures / Treatments   Labs (all labs ordered are listed, but only abnormal results are displayed) Labs Reviewed  CBC WITH DIFFERENTIAL/PLATELET - Abnormal; Notable for the following components:      Result Value   RBC 2.37 (*)    Hemoglobin 7.2 (*)    HCT 23.5 (*)    All other components within normal limits  COMPREHENSIVE METABOLIC PANEL - Abnormal; Notable for the following components:   Sodium 132 (*)    Chloride 95 (*)    CO2 34 (*)    Glucose, Bld 325 (*)    Creatinine, Ser 1.58 (*)    Calcium 8.4 (*)    Total Protein 10.4 (*)    Albumin 2.7 (*)    AST 14 (*)    Total Bilirubin 0.0 (*)    GFR, Estimated 48 (*)    Anion gap 3 (*)    All other components within normal limits  BRAIN NATRIURETIC PEPTIDE - Abnormal; Notable for the following components:   B Natriuretic Peptide 313.0 (*)    All other components within normal limits  D-DIMER, QUANTITATIVE - Abnormal; Notable for the following components:   D-Dimer, Quant 1.31 (*)    All other components within normal limits  URINALYSIS, ROUTINE W REFLEX MICROSCOPIC - Abnormal; Notable for the following components:   APPearance TURBID (*)    Glucose, UA >=500 (*)    Protein, ur 100 (*)     Leukocytes,Ua LARGE (*)    All other components within normal limits  TROPONIN I (HIGH SENSITIVITY) - Abnormal; Notable for the following components:   Troponin I (High Sensitivity) 812 (*)    All other components within normal limits  URINE CULTURE  CULTURE, BLOOD (ROUTINE X 2)  CULTURE, BLOOD (ROUTINE X 2)  LIPASE, BLOOD  LACTIC ACID, PLASMA  LACTIC ACID, PLASMA  TROPONIN I (HIGH SENSITIVITY)    EKG EKG Interpretation  Date/Time:  Sunday July 30 2020 00:02:55 EDT Ventricular Rate:  125 PR Interval:  146 QRS Duration: 95 QT Interval:  349 QTC Calculation: 504 R Axis:  73 Text Interpretation: Sinus tachycardia Repol abnrm suggests ischemia, diffuse leads Prolonged QT interval rate faster Nonspecific ST abnormality Confirmed by Ezequiel Essex [phone]) on 07/30/2020 1:07:59 AM  Radiology DG Chest Portable 1 View  Result Date: 07/30/2020 CLINICAL DATA:  Short of breath for 1 day, tobacco abuse EXAM: PORTABLE CHEST 1 VIEW COMPARISON:  07/07/2020 FINDINGS: 2 frontal views of the chest demonstrate a stable cardiac silhouette. Patchy retrocardiac consolidation at the medial costophrenic angle could reflect focal bronchopneumonia or atelectasis. No effusion or pneumothorax. No acute bony abnormalities. IMPRESSION: 1. Patchy retrocardiac consolidation which could reflect bronchopneumonia. Electronically Signed   By: Randa Ngo M.D.   On: 07/30/2020 01:49    Procedures .Critical Care  Date/Time: 07/30/2020 9:28 AM Performed by: Ezequiel Essex, MD Authorized by: Ezequiel Essex, MD   Critical care provider statement:    Critical care time (minutes):  60   Critical care was necessary to treat or prevent imminent or life-threatening deterioration of the following conditions:  Cardiac failure and respiratory failure   Critical care was time spent personally by me on the following activities:  Discussions with consultants, evaluation of patient's response to treatment, examination  of patient, ordering and performing treatments and interventions, ordering and review of laboratory studies, ordering and review of radiographic studies, pulse oximetry, re-evaluation of patient's condition, obtaining history from patient or surrogate and review of old charts   Medications Ordered in ED Medications  furosemide (LASIX) injection 20 mg (has no administration in time range)    ED Course  I have reviewed the triage vital signs and the nursing notes.  Pertinent labs & imaging results that were available during my care of the patient were reviewed by me and considered in my medical decision making (see chart for details).   1. Severe complex CAD. There is a tapering 70% stenosis in the distal Left main involving as well the ostium of the LAD and ramus intermediate with hazy appearance. The LCx is a co-dominant vessel with extensive dissection involving the ostium and proximal vessel and is is subtotally occluded. 2. Moderate LV dysfunction. EF 40-45% 3. Severely elevated LVEDP 30 mm Hg.   MDM Rules/Calculators/A&P                         Sudden onset shortness of breath with diaphoresis, chest pain and shortness of breath.  Improved and back to baseline.  EKG with sinus tachycardia with ST changes that are new.  Crackles throughout concerning for pulmonary edema  Breathing has improved after IV Lasix.  Tachycardia has improved to the 90s.  EKG does show significant changes involving ST depressions inferiorly and laterally.  Patient denies any current chest pain.  Last catheterization in February showed severe CAD that is not amenable to PCI or CABG.  Hemoglobin today is slightly decreased at 7.2.  Does have a history of acute on chronic GI bleed from gastrointestinal AVMs. Troponin today is a 812  Does have EKG changes with known severe CAD.  We will hold on heparin at this time given his history of chronic GI bleeds.  Aspirin given  Questionable retrocardiac opacity on  CXR. No cough or fever. Suspect edema.  Discussed with Dr. Renella Cunas cardiology.  He does recommend transfer to [name] for cardiology evaluation.  Patient appears to have ACS and possibly NSTEMI with EKG changes and positive troponin.  Recommends holding heparin at this point but checking hemoglobin in 4 hours to ensure not dropping further.  Does agree with transfusion of 1 unit packed red blood cells at this point. Cardiology will evaluate on arrival to [facility]. D-dimer slightly elevated at 1.3 but tachycardia and respiratory distress have improved.  Lower suspicion for pulmonary embolism.  We will hold on CT scan at this time.  Patient remains stable on 4L [place]. Denies further chest pain or SOB. Does have DNR at bedside but reports "I'm not sure about that." D/w patient's sister Geannie Risen. She is aware he will be transferred to [facility]. Epic lists patient having legal guardian but Kenney Houseman states she is not sure that he does.  Admission d/w Dr. Josephine Cables. pRBCs infusing. Hospitalist team aware of plan to recheck hemoglobin and starting heparin if stable.   Final Clinical Impression(s) / ED Diagnoses Final diagnoses:  NSTEMI (non-ST elevated myocardial infarction) (Early)  Acute respiratory failure with hypoxia [facility])    Rx / DC Orders ED Discharge Orders     None        Lamanda Rudder, Annie Main, MD 07/30/20 0930

## 2020-07-30 NOTE — Telephone Encounter (Signed)
Spoke with Dr. Wyvonnia Dusky at [facility] ED about [name] who is currently being evaluated for acute hypoxic respiratory distress.  Cory Weber presented from his facility with SOB, and diaphoresis.  O2 sats on EMS arrival were 86% he was placed on NRB with sats improving to 100% the patient feeling subjectively better.  At that time he denied chest pain and was no longer diaphoretic.  He was transferred to the ED on 4 L nasal cannula with his baseline O2 at 3 L continuous.  He reported to the ED provider that he did have some central chest pain earlier in the night when he was having trouble breathing which had since resolved.  ECG was sinus tachycardia with repol but no significant ischemic changes when compared to priors.  Cory Weber has extensive CAD with his most recent cath in February 2022 with 70% distal left main stenosis as well as ostial LAD stenosis.  He also had 99% circumflex stenosis and 40% proximal RCA stenosis.  At that time he was considered not a candidate for PCI and was discussed with surgery as well and medical management was recommended given complex comorbidities.  Evaluation today is significant for an elevated hsT (812) and BNP (313).  Creatinine is 1.6 which is at baseline.  BNP ranges from 200-300 at baseline as well. hsT is not normally significantly elevated (10-30 bl) so this is a marked elevation compared to prior.  In February however when he underwent coronary evaluation his hsT [place] at [address]. He has a mildly worse anemia compared to baseline (Hb 7.2 today, bl 8-9).  He has had extensive evaluation for his chronic anemia including EGD and video capsule without clear source.  Dr. Wyvonnia Dusky will transfuse 1 unit PRBCs and recheck labs in 4 hours to see if hemoglobin has appropriately responded before initiating heparin without bolus for NSTEMI.  He seems to have improved symptomatically and for transport to the ED.  In the ED he was treated with aspirin and Lasix and currently is chest  pain-free.  Continue to trend troponins and will likely recommend medical treatment for NSTEMI.  He does not appear to have any viable options for revascularization.

## 2020-07-30 NOTE — Progress Notes (Signed)
   07/30/20 1817  Assess: MEWS Score  Temp 98.1 F (36.7 C)  BP (!) 181/100  Pulse Rate (!) 120  ECG Heart Rate (!) 122  Resp (!) 25  SpO2 97 %  O2 Device Nasal Cannula  O2 Flow Rate (L/min) 4 L/min  Assess: MEWS Score  MEWS Temp 0  MEWS Systolic 0  MEWS Pulse 2  MEWS RR 1  MEWS LOC 0  MEWS Score 3  MEWS Score Color Yellow  Assess: if the MEWS score is Yellow or Red  Were vital signs taken at a resting state? Yes  Focused Assessment No change from prior assessment  Early Detection of Sepsis Score *See Row Information* Low  MEWS guidelines implemented *See Row Information* Yes  Treat  MEWS Interventions Administered scheduled meds/treatments  Pain Scale 0-10  Pain Score 7  Pain Type Acute pain  Take Vital Signs  Increase Vital Sign Frequency  Red: Q 1hr X 4 then Q 4hr X 4, if remains red, continue Q 4hrs  Escalate  MEWS: Escalate Red: discuss with charge nurse/RN and provider, consider discussing with RRT  Notify: Charge Nurse/RN  Name of Charge Nurse/RN Notified Kerrie Buffalo, RN  Date Charge Nurse/RN Notified 07/30/20  Time Charge Nurse/RN Notified 1817  Notify: Provider  Provider Name/Title Delila Pereyra, MD  Date Provider Notified 07/30/20  Time Provider Notified 1800  Notification Type Page  Notification Reason Change in status  Provider response At bedside  Date of Provider Response 07/30/20  Time of Provider Response 1800  Notify: Rapid Response  Name of Rapid Response RN Notified Saralyn Pilar, RN  Date Rapid Response Notified 07/30/20  Time Rapid Response Notified 1800

## 2020-07-30 NOTE — ED Notes (Signed)
Date and time results received: 07/30/20 5:18 AM  (use smartphrase ".now" to insert current time)  Test: troponin Critical Value: 1,116  Name of Provider Notified: Adefeso  Orders Received? Or Actions Taken?: acknowledged

## 2020-07-30 NOTE — Progress Notes (Signed)
Patient in respiratory distress diaphoretic with elevated BP HR and respirations. Notified Dr Ailene Ravel Dr Avon Gully and Saralyn Pilar from Rapid response.

## 2020-07-30 NOTE — Consult Note (Addendum)
Cardiology Consultation:   Patient ID: Cory Weber MRN: [id number]; DOB: 26-Mar-1950  Admit date: 07/29/2020 Date of Consult: 07/30/2020  PCP:  Caprice Renshaw, MD   [facility] Providers Cardiologist:  Rozann Lesches, MD        Patient Profile:   Cory Weber is a 70 y.o. male with a hx of hypertension, hyperlipidemia, insulin-dependent DM, prior CVA, significant PVD, hepatitis C, recurrent GI bleed and recently diagnosed multivessel CAD who is being seen 07/30/2020 for the evaluation of NSTEMI at the request of Dr. Josephine Cables.  History of Present Illness:   Cory Weber is a 70 year old male with a past medical history of hypertension, hyperlipidemia, insulin-dependent DM, prior CVA, significant PVD, hepatitis C, recurrent GI bleed and recently diagnosed multivessel CAD.  Patient underwent left femoropopliteal bypass with subsequent left BKA in 08/2015 and right BKA in 08/2016.  She was previously seen by Dr. Bronson Ing in February 2020 for evaluation of chest discomfort.  Myoview obtained at the time showed a medium sized, moderate intensity, fixed inferior/inferoseptal defect that was consistent with soft tissue attenuation versus scar, no definitive ischemic territory, the study was read as high risk due to low EF.  Echocardiogram showed preserved EF of 55 to 60%, no regional wall motion abnormality, no significant valve issue.  He was admitted with NSTEMI on 03/20/2020.  Serial troponin was elevated at 12,000.  Initial echocardiogram showed EF down to 40 to 45%, apical hypokinesis.  Subsequent cardiac catheterization performed on 03/21/2020 revealed 70% mid to distal left main lesion, 70% ostial to proximal LAD lesion, 70% ramus lesion, 99% ostial to mid left circumflex lesion, 40% proximal RCA lesion, EF 35 to 45%.  Patient was felt not a candidate for both PCI or CABG given multiple comorbidities.  He was managed medically.  He was placed on aspirin, Plavix, carvedilol, statin, Imdur and  hydralazine.  Hospitalization was complicated by anemia that required 4 units of packed red blood transfusion, GI was consulted and the patient underwent endoscopy on 03/31/2020 which noted some erosive gastropathy and erythematous mucosa in the fundus, unfortunately patient did not complete his initial bowel prep.  Subsequent capsule endoscopy performed on 04/05/2020 revealed esophageal plaques suspicious for candidiasis, small AVMs.  He eventually underwent enteroscopy on 04/07/2020 and had argon coagulation for all 3 AVMs. After discharge, he was established with Dr. Domenic Polite on 05/09/2020.  Since the last visit, patient went back to the hospital on 06/01/2020 due to acute on chronic anemia, but hemoglobin down to 6.7.  He was transfused with 1 unit of packed red blood cell.  Plavix was held.  He was admitted again for symptomatic anemia on 06/23/2020 during which hospitalization he received 2 more units of packed red blood cell.  Patient presented to [facility] ED shortly after midnight this morning with worsening shortness of breath.  On EMS arrival, O2 saturation was 86%.  He was placed on nonrebreather, this was eventually down titrated to nasal cannula.  Initial troponin was elevated at 812, subsequent troponin went up to 1116.  Procalcitonin negative.  EKG showed sinus tachycardia with ST changes.  Breathing improved with IV Lasix.  Hemoglobin down to 7.3.  D-dimer elevated at 1.31.  He was transfused 1 unit of packed red blood cell.  The case was discussed with on-call overnight fellow who recommended transfer to [facility] for medical therapy.  Talking with the patient, he says he did have some chest pain along with shortness of breath last night that woke  him up from sleep.  The chest pain has the same characteristic as the previous angina back in February however not as intense.  Symptom improved after he received a single dose of 20 mg IV Lasix.   Past Medical History:  Diagnosis  Date   Acute anemia 06/01/2020   Anemia    Asthma    BPH (benign prostatic hyperplasia)    CAD (coronary artery disease)    Severe multivessel disease 03/2020 - poor revascularization options   Chronic diastolic heart failure (HCC)    CKD (chronic kidney disease) stage 3, GFR 30-59 ml/min (HCC)    COPD (chronic obstructive pulmonary disease) (HCC)    CVA (cerebral vascular accident) ([place]) 2015   Essential hypertension    Gangrene ([place]) 06/10/2016   GERD (gastroesophageal reflux disease)    Hepatitis C    History of pneumonia    Hyperlipidemia    Osteomyelitis (HCC)    PAD (peripheral artery disease) (HCC)    Bilateral BKA   Peripheral neuropathy    Type II diabetes mellitus ([place])     Past Surgical History:  Procedure Laterality Date   ABDOMINAL AORTOGRAM N/A 06/03/2016   Procedure: Abdominal Aortogram;  Surgeon: Waynetta Sandy, MD;  Location: [place] CV [facility];  Service: Cardiovascular;  Laterality: N/A;   AMPUTATION Left 08/16/2015   Procedure: LEFT BELOW THE KNEE AMPUTATION ;  Surgeon: Serafina Mitchell, MD;  Location: [place];  Service: Vascular;  Laterality: Left;   AMPUTATION Right 08/22/2016   Procedure: AMPUTATION BELOW KNEE-RIGHT;  Surgeon: Waynetta Sandy, MD;  Location: [place];  Service: Vascular;  Laterality: Right;   BACK SURGERY     BELOW KNEE LEG AMPUTATION Left 08/16/2015   BIOPSY N/A 09/03/2012   Procedure: BIOPSY;  Surgeon: Daneil Dolin, MD;  [facility]: [facility];  Service: Endoscopy;  Laterality: N/A;  gastric and gastric mucosa   BIOPSY N/A 12/03/2012   Procedure: BIOPSY;  Surgeon: Daneil Dolin, MD;  Location: [facility];  Service: Endoscopy;  Laterality: N/A;   BIOPSY  03/31/2020   Procedure: BIOPSY;  Surgeon: Ladene Artist, MD;  Location: [place];  Service: Endoscopy;;   COLONOSCOPY WITH PROPOFOL N/A 09/03/2012   IPJ:ASNKNLZ polyp-removed as outlined above. Prominent internal hemorrhoids. Tubular adenoma   COLONOSCOPY WITH PROPOFOL N/A  04/05/2020   Procedure: COLONOSCOPY WITH PROPOFOL;  Surgeon: Lavena Bullion, DO;  Location: [place];  Service: Gastroenterology;  Laterality: N/A;   ENTEROSCOPY Left 04/05/2020   Procedure: ENTEROSCOPY;  Surgeon: Lavena Bullion, DO;  Location: [place];  Service: Gastroenterology;  Laterality: Left;   ENTEROSCOPY N/A 04/07/2020   Procedure: ENTEROSCOPY;  Surgeon: Lavena Bullion, DO;  Location: [place];  Service: Gastroenterology;  Laterality: N/A;   ESOPHAGOGASTRODUODENOSCOPY (EGD) WITH PROPOFOL N/A 09/03/2012   JQB:HALPFX hernia. Gastric diverticulum. Gastric ulcers with associated erosions. Duodenal erosions. Status post gastric biopsy. H.PYLORI gastritis    ESOPHAGOGASTRODUODENOSCOPY (EGD) WITH PROPOFOL N/A 12/03/2012   Dr. Gala Romney: gastric diverticulum, gastric erosions and scar. Previously noted gastric ulcer completed healed. Biopsy without H.pylori.    ESOPHAGOGASTRODUODENOSCOPY (EGD) WITH PROPOFOL N/A 01/23/2016   Procedure: ESOPHAGOGASTRODUODENOSCOPY (EGD) WITH PROPOFOL;  Surgeon: Doran Stabler, MD;  Location: [facility];  Service: Gastroenterology;  Laterality: N/A;   ESOPHAGOGASTRODUODENOSCOPY (EGD) WITH PROPOFOL N/A 03/31/2020   Procedure: ESOPHAGOGASTRODUODENOSCOPY (EGD) WITH PROPOFOL;  Surgeon: Ladene Artist, MD;  Location: [facility] ENDOSCOPY;  Service: Endoscopy;  Laterality: N/A;   ESOPHAGOGASTRODUODENOSCOPY (EGD) WITH PROPOFOL N/A 06/02/2020   Procedure: ESOPHAGOGASTRODUODENOSCOPY (  EGD) WITH PROPOFOL;  Surgeon: Eloise Harman, DO;  [facility];  Service: Endoscopy;  Laterality: N/A;   GIVENS CAPSULE STUDY N/A 04/05/2020   Procedure: GIVENS CAPSULE STUDY;  Surgeon: Lavena Bullion, DO;  Location: [place];  Service: Gastroenterology;  Laterality: N/A;   HOT HEMOSTASIS N/A 04/07/2020   Procedure: HOT HEMOSTASIS (ARGON PLASMA COAGULATION/BICAP);  Surgeon: Lavena Bullion, DO;  Location: [facility] ENDOSCOPY;  Service: Gastroenterology;  Laterality:  N/A;   I & D EXTREMITY Right 07/11/2016   Procedure: DELAY PRIMARY CLOSURE FOOT AMPUTATION;  Surgeon: Edrick Kins, DPM;  Location: [place];  Service: Podiatry;  Laterality: Right;   LEFT HEART CATH AND CORONARY ANGIOGRAPHY N/A 03/21/2020   Procedure: LEFT HEART CATH AND CORONARY ANGIOGRAPHY;  Surgeon: Martinique, Peter M, MD;  Location: [place] CV LAB;  Service: Cardiovascular;  Laterality: N/A;   LIVER BIOPSY  2005   Done in [place], [place]. Chronic hepatitis with mild periportal inflammation, lobular unicellular necrosis and portal fibrosis. Grade 2, stage 1-2.   LOWER EXTREMITY ANGIOGRAPHY Right 06/03/2016   Procedure: Lower Extremity Angiography;  Surgeon: Waynetta Sandy, MD;  Location: [place] CV [facility];  Service: Cardiovascular;  Laterality: Right;   MAXIMUM ACCESS (MAS)POSTERIOR LUMBAR INTERBODY FUSION (PLIF) 1 LEVEL N/A 11/25/2013   Procedure: FOR MAXIMUM ACCESS (MAS) POSTERIOR LUMBAR INTERBODY FUSION (PLIF) 1 LEVEL;  Surgeon: Eustace Moore, MD;  Location: [place] [facility]URO ORS;  Service: Neurosurgery;  Laterality: N/A;  FOR MAXIMUM ACCESS (MAS) POSTERIOR LUMBAR INTERBODY FUSION (PLIF) 1 LEVEL LUMBAR 3-4   PERIPHERAL VASCULAR ATHERECTOMY Right 06/03/2016   Procedure: Peripheral Vascular Atherectomy;  Surgeon: Waynetta Sandy, MD;  Location: [place] CV LAB;  Service: Cardiovascular;  Laterality: Right;  SFA WITH STENT   PERIPHERAL VASCULAR CATHETERIZATION Left 08/01/2015   Procedure: Lower Extremity Angiography;  Surgeon: Serafina Mitchell, MD;  Location: [place] CV LAB;  Service: Cardiovascular;  Laterality: Left;   PERIPHERAL VASCULAR CATHETERIZATION N/A 08/01/2015   Procedure: Abdominal Aortogram;  Surgeon: Serafina Mitchell, MD;  Location: [place] CV [facility];  Service: Cardiovascular;  Laterality: N/A;   PERIPHERAL VASCULAR CATHETERIZATION N/A 08/08/2015   Procedure: Abdominal Aortogram w/Lower Extremity;  Surgeon: Serafina Mitchell, MD;  Location: [place] CV LAB;  Service:  Cardiovascular;  Laterality: N/A;   PERIPHERAL VASCULAR CATHETERIZATION Left 08/08/2015   Procedure: Peripheral Vascular Balloon Angioplasty;  Surgeon: Serafina Mitchell, MD;  Location: [place] [facility];  Service: Cardiovascular;  Laterality: Left;  left popiteal artery, left peronealtrunk, left post tibial   POLYPECTOMY N/A 09/03/2012   Procedure: POLYPECTOMY;  Surgeon: Daneil Dolin, MD;  [facility];  Service: Endoscopy;  Laterality: N/A;  cecal polyp   SUBMUCOSAL TATTOO INJECTION  04/07/2020   Procedure: SUBMUCOSAL TATTOO INJECTION;  Surgeon: Lavena Bullion, DO;  Location: [place] ENDOSCOPY;  Service: Gastroenterology;;   TONSILLECTOMY     TRANSMETATARSAL AMPUTATION Right 06/13/2016   Procedure: RIGHT TRANSMETATARSAL AMPUTATION;  Surgeon: Waynetta Sandy, MD;  Location: [place];  Service: Vascular;  Laterality: Right;   TRANSMETATARSAL AMPUTATION Right 07/10/2016   Procedure: REVISION RIGHT TRANSMETATARSAL AMPUTATION;  Surgeon: Waynetta Sandy, MD;  Location: [place];  Service: Vascular;  Laterality: Right;     Home Medications:  Prior to Admission medications   Medication Sig Start Date End Date Taking? Authorizing Provider  aspirin EC 81 MG tablet Take 1 tablet (81 mg total) by mouth daily with breakfast. 02/24/19  Yes Emokpae, Courage, MD  baclofen (LIORESAL)  10 MG tablet Take 1 tablet (10 mg total) by mouth every 8 (eight) hours as needed for muscle spasms. For shoulder spasm Patient taking differently: Take 10 mg by mouth every 8 (eight) hours. scheduled 06/03/20  Yes Manuella Ghazi, Pratik D, DO  carvedilol (COREG) 25 MG tablet Take 1 tablet (25 mg total) by mouth 2 (two) times daily with a meal. 04/08/20  Yes Regalado, Belkys A, MD  cloNIDine (CATAPRES) 0.1 MG tablet Take 0.1 mg by mouth at bedtime.   Yes [provider]  clopidogrel (PLAVIX) 75 MG tablet Take 75 mg by mouth daily.   Yes [provider]  DULoxetine (CYMBALTA) 60 MG capsule Take 60 mg by mouth  daily.   Yes [provider]  ferrous gluconate (FERGON) 324 MG tablet Take 324 mg by mouth daily.   Yes [provider]  furosemide (LASIX) 80 MG tablet Take 80 mg by mouth daily. 06/09/20  Yes [provider]  gabapentin (NEURONTIN) 300 MG capsule Take 300 mg by mouth 3 (three) times daily.   Yes [provider]  hydrALAZINE (APRESOLINE) 50 MG tablet Take 1 tablet (50 mg total) by mouth daily. 06/24/20  Yes Barton Dubois, MD  insulin glargine (LANTUS) 100 UNIT/ML injection Inject 0.18 mLs (18 Units total) into the skin at bedtime. 06/24/20  Yes Barton Dubois, MD  iron polysaccharides (NIFEREX) 150 MG capsule Take 1 capsule (150 mg total) by mouth 2 (two) times daily. Patient taking differently: Take 150 mg by mouth every evening. 06/24/20  Yes Barton Dubois, MD  ISOSORBIDE DINITRATE PO Take 120 mg by mouth every morning.   Yes [provider]  Lidocaine (SALONPAS PAIN RELIEVING) 4 % PTCH Place 2 patches onto the skin See admin instructions. Apply 1 patch transdermally one time daily to each knee for pain - remove after 12 hours   Yes [provider]  loratadine (CLARITIN) 10 MG tablet Take 10 mg by mouth daily.   Yes [provider]  Melatonin 5 MG TABS Take 5 mg by mouth at bedtime.   Yes [provider]  omeprazole (PRILOSEC) 20 MG capsule Take 20 mg by mouth 2 (two) times daily. 07/03/20  Yes [provider]  polyethylene glycol (MIRALAX) 17 g packet Take 17 g by mouth daily. Patient taking differently: Take 17 g by mouth 2 (two) times daily. Mix in 8 oz fluid of choice 07/04/20  Yes Suella Broad A, PA-C  rosuvastatin (CRESTOR) 20 MG tablet Take 1 tablet (20 mg total) by mouth daily. 04/08/20  Yes Regalado, Belkys A, MD  senna (SENOKOT) 8.6 MG tablet Take 2 tablets by mouth 2 (two) times daily.   Yes [provider]  tamsulosin (FLOMAX) 0.4 MG CAPS capsule Take 1 capsule (0.4 mg total) by mouth 2 (two) times  daily. 07/25/20  Yes McKenzie, Candee Furbish, MD  acetaminophen (TYLENOL) 325 MG tablet Take 2 tablets (650 mg total) by mouth every 6 (six) hours as needed for mild pain or headache (fever >/= 101). 02/24/19   Roxan Hockey, MD  glycerin adult 2 g suppository Place 1 suppository rectally as needed for constipation. 07/04/20   Tacy Learn, PA-C  HYDROcodone-acetaminophen (NORCO/VICODIN) 5-325 MG tablet Take 1 tablet by mouth 4 (four) times daily as needed. 06/26/20   [provider]  insulin lispro (HUMALOG) 100 UNIT/ML injection Inject 0.05 mLs (5 Units total) into the skin in the morning, at noon, and at bedtime. Give only if eats 50% or more of meal. Patient  taking differently: Inject 8 Units into the skin in the morning, at noon, and at bedtime. Give only if eats 50% or more of meal. 06/24/20   Barton Dubois, MD  ipratropium-albuterol (DUONEB) 0.5-2.5 (3) MG/3ML SOLN Take 3 mLs by nebulization every 6 (six) hours as needed (SOB/ wheezing).    [provider]  isosorbide mononitrate (IMDUR) 120 MG 24 hr tablet Take 1 tablet (120 mg total) by mouth daily. 04/08/20   Regalado, Belkys A, MD  Lidocaine 4 % PTCH Apply 1 patch topically daily. Apply 1 patch to both stumps once daily, 12 hour use    [provider]  pantoprazole (PROTONIX) 40 MG tablet Take 1 tablet (40 mg total) by mouth 2 (two) times daily. 06/24/20   Barton Dubois, MD  sodium phosphate (FLEET) 7-19 GM/118ML ENEM Place 1 enema rectally daily as needed for severe constipation.    [provider]  vitamin B-12 (CYANOCOBALAMIN) 500 MCG tablet Take 500 mcg by mouth daily.     [provider]    Inpatient Medications: Scheduled Meds:  sodium chloride   Intravenous Once   aspirin EC  81 mg Oral Q breakfast   carvedilol  25 mg Oral BID WC   clopidogrel  75 mg Oral Daily   feeding supplement (GLUCERNA SHAKE)  237 mL Oral TID BM   furosemide  40 mg Intravenous Once   furosemide  80 mg Oral Daily    hydrALAZINE  50 mg Oral Daily   insulin aspart  0-5 Units Subcutaneous QHS   insulin aspart  0-9 Units Subcutaneous TID WC   insulin glargine  8 Units Subcutaneous QHS   iron polysaccharides  150 mg Oral BID   isosorbide mononitrate  180 mg Oral Daily   melatonin  6 mg Oral QHS   pantoprazole  40 mg Oral BID   polyethylene glycol  17 g Oral Daily   rosuvastatin  20 mg Oral QHS   tamsulosin  0.4 mg Oral Daily   vitamin B-12  500 mcg Oral Daily   Continuous Infusions:  PRN Meds: ipratropium-albuterol, morphine injection  Allergies:    No Known Allergies   Social History:   Social History   Socioeconomic History   Marital status: Divorced    Spouse name: Not on file   Number of children: 1   Years of education: Not on file   Highest education level: Not on file  Occupational History   Occupation: disabled  Tobacco Use   Smoking status: Former    Packs/day: 1.00    Years: 47.00    Pack years: 47.00    Types: Cigarettes    Quit date: 07/13/2015    Years since quitting: 5.0   Smokeless tobacco: Never  Vaping Use   Vaping Use: Never used  Substance and Sexual Activity   Alcohol use: No    Alcohol/week: 0.0 standard drinks    Comment:  none 08/15/2015 "stopped drinking 3-4 years ago"   Drug use: No    Comment: 08/15/2015 "last used cocaine 3-4 months ago"   Sexual activity: Not on file  Other Topics Concern   Not on file  Social History Narrative   Not on file   Social Determinants of Health   Financial Resource Strain: Not on file  Food Insecurity: Not on file  Transportation Needs: Not on file  Physical Activity: Not on file  Stress: Not on file  Social Connections: Not on file  Intimate Partner Violence: Not on file  Family History:    Family History  Problem Relation Age of Onset   Breast cancer Mother    Heart failure Mother    Diabetes Father    Hypertension Father    Heart disease Father    Hyperlipidemia Father    Heart failure Father     Diabetes Sister    Hypertension Sister    Breast cancer Sister    Colon cancer Neg Hx    Liver disease Neg Hx      ROS:  Please see the history of present illness.   All other ROS reviewed and negative.     Physical Exam/Data:   Vitals:   07/30/20 1315 07/30/20 1330 07/30/20 1356 07/30/20 1600  BP: 132/84 (!) 145/85 131/82 (!) 181/96  Pulse: 98 91 93 (!) 106  Resp: (!) 22 (!) 22 19 20   Temp:   98 F (36.7 C) 98 F (36.7 C)  TempSrc:    Oral  SpO2: 97% 98% 99%   Weight:    116.3 kg  Height:    6\' 1"  (1.854 m)    Intake/Output Summary (Last 24 hours) at 07/30/2020 1651 Last data filed at 07/30/2020 1600 Gross per 24 hour  Intake 350 ml  Output --  Net 350 ml   Last 3 Weights 07/30/2020 07/30/2020 07/25/2020  Weight (lbs) 256 lb 6.4 oz 251 lb 5.2 oz (No Data)  Weight (kg) 116.302 kg 114 kg (No Data)     Body mass index is 33.83 kg/m.  General:  Well nourished, well developed, in no acute distress HEENT: normal Lymph: no adenopathy Neck: no JVD Endocrine:  No thryomegaly Vascular: No carotid bruits; FA pulses 2+ bilaterally without bruits  Cardiac:  normal S1, S2; RRR; no murmur  Lungs:  clear to auscultation bilaterally, no wheezing, rhonchi or rales  Abd: soft, nontender, no hepatomegaly  Ext: no edema Musculoskeletal: Bilateral BKA Skin: warm and dry  Neuro:  CNs 2-12 intact, no focal abnormalities noted Psych:  Normal affect   EKG:  The EKG was personally reviewed and demonstrates: Normal sinus rhythm without significant ST-T wave changes.  Initial EKG on arrival does show downsloping ST segment in inferior lateral leads along with sinus tachycardia, this resolved on subsequent EKG once heart rate improved. Telemetry:  Telemetry was personally reviewed and demonstrates: Normal sinus rhythm  Relevant CV Studies:  Cath 03/21/2020 Mid LM to Dist LM lesion is 70% stenosed. Ost LAD to Prox LAD lesion is 70% stenosed. Ramus lesion is 70% stenosed. Ost Cx to Mid Cx  lesion is 99% stenosed. Prox RCA lesion is 40% stenosed. There is moderate left ventricular systolic dysfunction. LV end diastolic pressure is severely elevated. The left ventricular ejection fraction is 35-45% by visual estimate.   1. Severe complex CAD. There is a tapering 70% stenosis in the distal Left main involving as well the ostium of the LAD and ramus intermediate with hazy appearance. The LCx is a co-dominant vessel with extensive dissection involving the ostium and proximal vessel and is is subtotally occluded. 2. Moderate LV dysfunction. EF 40-45% 3. Severely elevated LVEDP 30 mm Hg.   Plan: patient is not a candidate for PCI. He is likely not a candidate for CABG as well given multiple co-morbidities. Will discuss with rounding team. Likely manage medically. Prognosis is very poor.  Laboratory Data:  High Sensitivity Troponin:   Recent Labs  Lab 07/03/20 1145 07/07/20 1343 07/07/20 1544 07/30/20 0224 07/30/20 0406  TROPONINIHS 26* 17 19* 812* 1,116*  Chemistry Recent Labs  Lab 07/30/20 0224  NA 132*  K 3.6  CL 95*  CO2 34*  GLUCOSE 325*  BUN 21  CREATININE 1.58*  CALCIUM 8.4*  GFRNONAA 48*  ANIONGAP 3*    Recent Labs  Lab 07/30/20 0224  PROT 10.4*  ALBUMIN 2.7*  AST 14*  ALT 12  ALKPHOS 54  BILITOT 0.0*   Hematology Recent Labs  Lab 07/30/20 0224  WBC 4.7  RBC 2.37*  HGB 7.2*  HCT 23.5*  MCV 99.2  MCH 30.4  MCHC 30.6  RDW 14.3  PLT 220   BNP Recent Labs  Lab 07/30/20 0224  BNP 313.0*    DDimer  Recent Labs  Lab 07/30/20 0224  DDIMER 1.31*     Radiology/Studies:  DG Chest Portable 1 View  Result Date: 07/30/2020 CLINICAL DATA:  Short of breath for 1 day, tobacco abuse EXAM: PORTABLE CHEST 1 VIEW COMPARISON:  07/07/2020 FINDINGS: 2 frontal views of the chest demonstrate a stable cardiac silhouette. Patchy retrocardiac consolidation at the medial costophrenic angle could reflect focal bronchopneumonia or atelectasis. No  effusion or pneumothorax. No acute bony abnormalities. IMPRESSION: 1. Patchy retrocardiac consolidation which could reflect bronchopneumonia. Electronically Signed   By: Randa Ngo M.D.   On: 07/30/2020 01:49     Assessment and Plan:   NSTEMI  -Likely related to underlying severe CAD.  Patient presented with chest pain and shortness of breath with significant diaphoresis that woke him up from sleep.  Although symptoms did improve after a low-dose 20 mg of Lasix, however patient does not appears to be significantly volume overloaded.  -He is lying comfortably at this time, however still has mild chest discomfort.  -Previously Plavix was held in the setting of multiple GI bleed and severe anemia that require blood transfusion, Plavix has since been resumed at his facility.  -We will discuss with MD regarding IV heparin, may need to consider IV heparin without loading dose to avoid significant bleeding.  Acute respiratory failure: Unclear cause, symptoms suddenly started last night that woke him up from sleep.  Chest x-ray showed patchy retrocardiac consolidation which could reflect bronchopneumonia.  Symptom improved after a single 20 mg dose of IV Lasix in the ED.  He does not appear to be overtly volume overloaded on my physical exam.  Recurrent anemia secondary to combination of GI bleeding and chronic kidney disease  Multivessel CAD: Last cardiac catheterization February 2022, he has severe multivessel disease not amenable to PCI or CABG.  Hypertension  Hyperlipidemia  Insulin-dependent DM  Bilateral BKA  History of CVA  PAD   Risk Assessment/Risk Scores:     TIMI Risk Score for Unstable Angina or Non-ST Elevation MI:   The patient's TIMI risk score is 6, which indicates a 41% risk of all cause mortality, new or recurrent myocardial infarction or need for urgent revascularization in the next 14 days.          For questions or updates, please contact [place] Please  consult [email or web address] for contact info under    Hilbert Corrigan, [place]  07/30/2020 4:51 PM  Attending note  Patient seen and discussed with PA Eulas Post, I agree with his documentation. 70 yo male history of CAD with NSTEMI 09/2018 with multivessel disease not amenable to PCI or CABG, chronic systolic HF, CKD, prior CVA, HTN, PAD with prior bilateral BKAs, chronic anemia  During 03/2020 admission after cath deemend not PCI candidate or CABG candidate.      WBC 4.7  Hgb 7.2 K 3.6 Cr 1.58 BUN 21 BNP 313 Ddimer 1.31 Lacitc acid 1.2 Proacl <0.1 Trop 812-->1116 CXR possible bronchopneumonia EKG SR fairly chronic lateral ST depressions   03/2020 cath: LM 70%, ostial LAD 70%, ramus 0%, LCX ostial 99%, RCA 40%. Not a PCI candidate of CABG candidate during that admission.  03/2020 echo LVEF 40-45%, normal RV.   Elevated troponin in patient known severe multivessel disease including LM disease not amenable to PCI and not a CABG candidate based on prior admission. Unclear if acute obstructive disease or perhaps increased demand in setting of potential pneumoina and anemia, for either scenario would not be a cath candidate. Medical therapy with ASA 81, coreg 25mg  bid, crestor 20mg , imdur 120, plavix 75. Reluctant to heparinize with a Hgb of 7.2 on admission Do not see how repeated echo would affect management, already on maximal tolerated medical therapy which has been limited by his renal dysfunction. Some evidence of fluid overload.   Not much to offer from a cardiac standpoint. Continue home cardiac regimen, can work to adjust antianginals,optimzie Hgb. Will increase imdur to 180mg  daily. If further drop in Hgb may have to come off aspirin and use just plavix alone. Redose IV lasix 40mg  x 1, reassess volume status in AM.     Carlyle Dolly MD

## 2020-07-30 NOTE — ED Triage Notes (Signed)
Pt brought in by [facility] from [place] for SOB. Pt is on continuous oxygen at 3L with hx of CHF. Upon EMS arrival pt was diaphoretic with O2 sats @86 %. EMS placed pt on non-rebreather and sats improved to 100%. Pt says he is feeling better. Pt denies any pain. Pt denies chest pain. Pt is no longer diaphoretic. Pt placed on [place] @ 4 L upon arrival.

## 2020-07-30 NOTE — Significant Event (Signed)
Rapid Response Event Note   Reason for Call :  SOB and Chest pain  Initial Focused Assessment:  [facility] for chronic hypoxic respiratory failure in the setting of NSTEMI. Patient has an extensive cardiac history as well as other comorbid conditions. The patient has elevated troponin's that are trending upwards. Not a candidate for PCI or CABG due to multiple comorbid conditions  Patient was tachycardic, tachypnic, diaphoretic, and hypertensive after receiving one unit of blood. The patient endorses chest pain and SOB.   Cardiology and hospitalist at bedside  BP 208/113 ECG 129 RR 26 O2 97    Interventions:  Patient placed on bipap SL nitro given x 2 Coreg 25 mg x 2 Nitro gtt started and titrated as high as 50 Vitals taken Xray  Plan of Care:  Currently the patient will remain on the unit and continue to get nitro gtt until pressure returns to a safer level Patient will remain on bipap until his WOB gets better   Event Summary:   MD Notified: Cards and hospitalist Call Time: [place] Time: 1805 End Time: [place], RN

## 2020-07-30 NOTE — H&P (Signed)
History and Physical  Cory Weber QBH:[id number] DOB: 1950-07-11 DOA: 07/29/2020  Referring physician: Ezequiel Essex, MD  PCP: Caprice Renshaw, MD  Patient coming from: [facility]  Chief Complaint: Shortness of breath  HPI: Cory Weber is a 70 y.o. male with medical history significant for  chronic blood loss anemia, asthma, COPD, BPH, CAD, chronic diastolic heart failure, history of CVA, hypertension, GERD, hyperlipidemia, osteomyelitis, PAD with bilateral BKA, type II DM with diabetic peripheral neuropathy, chronic respiratory failure on 3 LPM of oxygen via [place] at baseline who presents to the emergency department via EMS due to shortness of breath last night (6/18).  Patient states that he became short of breath just prior to going to bed at the living facility last night, EMS was activated and on arrival of EMS team, patient was noted to be hypoxic with an O2 sat of 86%, he was also diaphoretic, he was then placed on NRB with improved saturation to 100%.  He denies chest pain, nausea, vomiting fever or chills.  ED Course:  In the emergency department, he was initially tachycardic and tachypneic, but these eventually returned to normal range shortly after arrival to the ED and he was transitioned to supplemental oxygen of 4 LPM.  Work-up in the ED showed normocytic anemia, hyponatremia, BUN to creatinine 21/1.58 (baseline creatinine at 1.3-1.8), albumin 2.7, BNP 313 (this was 326 about 3 weeks ago), HS troponin -812 >>1116, D-dimer 1.31 Chest x-ray showed patchy retrocardiac consolidation which could reflect bronchopneumonia Aspirin 324 mg x 1 was given, IV Lasix 20 Mg x1 was given.  Cardiology (Dr. Renella Cunas) was consulted and agreed with ED physician transfusing 1 unit of PRBC, but to repeat H&H after the transfusion to ensure that hemoglobin has responded appropriately prior to starting heparin drip without bolus for NSTEMI.  Hospitalist was asked to admit patient for further evaluation  and management.  Review of Systems: Constitutional: Positive for excessive sweats.  Negative for chills and fever.  HENT: Negative for ear pain and sore throat.   Eyes: Negative for pain and visual disturbance.  Respiratory: Positive for shortness of breath and cough   Cardiovascular: Negative for chest pain and palpitations.  Gastrointestinal: Negative for abdominal pain and vomiting.  Endocrine: Negative for polyphagia and polyuria.  Genitourinary: Negative for decreased urine volume, dysuria, enuresis Musculoskeletal: Negative for arthralgias and back pain.  Skin: Negative for color change and rash.  Allergic/Immunologic: Negative for immunocompromised state.  Neurological: Positive for weakness.  Negative for tremors, syncope, speech difficulty Hematological: Does not bruise/bleed easily.  All other systems reviewed and are negative  Past Medical History:  Diagnosis Date   Acute anemia 06/01/2020   Anemia    Asthma    BPH (benign prostatic hyperplasia)    CAD (coronary artery disease)    Severe multivessel disease 03/2020 - poor revascularization options   Chronic diastolic heart failure (HCC)    CKD (chronic kidney disease) stage 3, GFR 30-59 ml/min (HCC)    COPD (chronic obstructive pulmonary disease) (HCC)    CVA (cerebral vascular accident) ([place]) 2015   Essential hypertension    Gangrene ([place]) 06/10/2016   GERD (gastroesophageal reflux disease)    Hepatitis C    History of pneumonia    Hyperlipidemia    Osteomyelitis (HCC)    PAD (peripheral artery disease) (HCC)    Bilateral BKA   Peripheral neuropathy    Type II diabetes mellitus ([place])    Past Surgical History:  Procedure Laterality Date   ABDOMINAL  AORTOGRAM N/A 06/03/2016   Procedure: Abdominal Aortogram;  Surgeon: Waynetta Sandy, MD;  Location: [place] [facility];  Service: Cardiovascular;  Laterality: N/A;   AMPUTATION Left 08/16/2015   Procedure: LEFT BELOW THE KNEE AMPUTATION ;  Surgeon: Serafina Mitchell, MD;  Location: [place];  Service: Vascular;  Laterality: Left;   AMPUTATION Right 08/22/2016   Procedure: AMPUTATION BELOW KNEE-RIGHT;  Surgeon: Waynetta Sandy, MD;  Location: [place];  Service: Vascular;  Laterality: Right;   BACK SURGERY     BELOW KNEE LEG AMPUTATION Left 08/16/2015   BIOPSY N/A 09/03/2012   Procedure: BIOPSY;  Surgeon: Daneil Dolin, MD;  [facility];  Service: Endoscopy;  Laterality: N/A;  gastric and gastric mucosa   BIOPSY N/A 12/03/2012   Procedure: BIOPSY;  Surgeon: Daneil Dolin, MD;  [facility];  Service: Endoscopy;  Laterality: N/A;   BIOPSY  03/31/2020   Procedure: BIOPSY;  Surgeon: Ladene Artist, MD;  Location: [place];  Service: Endoscopy;;   COLONOSCOPY WITH PROPOFOL N/A 09/03/2012   STM:[id number] polyp-removed as outlined above. Prominent internal hemorrhoids. Tubular adenoma   COLONOSCOPY WITH PROPOFOL N/A 04/05/2020   Procedure: COLONOSCOPY WITH PROPOFOL;  Surgeon: Lavena Bullion, DO;  Location: [place];  Service: Gastroenterology;  Laterality: N/A;   ENTEROSCOPY Left 04/05/2020   Procedure: ENTEROSCOPY;  Surgeon: Lavena Bullion, DO;  Location: [place];  Service: Gastroenterology;  Laterality: Left;   ENTEROSCOPY N/A 04/07/2020   Procedure: ENTEROSCOPY;  Surgeon: Lavena Bullion, DO;  Location: [place];  Service: Gastroenterology;  Laterality: N/A;   ESOPHAGOGASTRODUODENOSCOPY (EGD) WITH PROPOFOL N/A 09/03/2012   NLG:XQJJHE hernia. Gastric diverticulum. Gastric ulcers with associated erosions. Duodenal erosions. Status post gastric biopsy. H.PYLORI gastritis    ESOPHAGOGASTRODUODENOSCOPY (EGD) WITH PROPOFOL N/A 12/03/2012   Dr. Gala Romney: gastric diverticulum, gastric erosions and scar. Previously noted gastric ulcer completed healed. Biopsy without H.pylori.    ESOPHAGOGASTRODUODENOSCOPY (EGD) WITH PROPOFOL N/A 01/23/2016   Procedure: ESOPHAGOGASTRODUODENOSCOPY (EGD) WITH PROPOFOL;  Surgeon: Doran Stabler, MD;  [facility];  Service: Gastroenterology;  Laterality: N/A;   ESOPHAGOGASTRODUODENOSCOPY (EGD) WITH PROPOFOL N/A 03/31/2020   Procedure: ESOPHAGOGASTRODUODENOSCOPY (EGD) WITH PROPOFOL;  Surgeon: Ladene Artist, MD;  Location[facility];  Service: Endoscopy;  Laterality: N/A;   ESOPHAGOGASTRODUODENOSCOPY (EGD) WITH PROPOFOL N/A 06/02/2020   Procedure: ESOPHAGOGASTRODUODENOSCOPY (EGD) WITH PROPOFOL;  Surgeon: Eloise Harman, DO;  [facility];  Service: Endoscopy;  Laterality: N/A;   GIVENS CAPSULE STUDY N/A 04/05/2020   Procedure: GIVENS CAPSULE STUDY;  Surgeon: Lavena Bullion, DO;  Location: [place];  Service: Gastroenterology;  Laterality: N/A;   HOT HEMOSTASIS N/A 04/07/2020   Procedure: HOT HEMOSTASIS (ARGON PLASMA COAGULATION/BICAP);  Surgeon: Lavena Bullion, DO;  Location: [facility] ENDOSCOPY;  Service: Gastroenterology;  Laterality: N/A;   I & D EXTREMITY Right 07/11/2016   Procedure: DELAY PRIMARY CLOSURE FOOT AMPUTATION;  Surgeon: Edrick Kins, DPM;  Location: [place];  Service: Podiatry;  Laterality: Right;   LEFT HEART CATH AND CORONARY ANGIOGRAPHY N/A 03/21/2020   Procedure: LEFT HEART CATH AND CORONARY ANGIOGRAPHY;  Surgeon: Martinique, Peter M, MD;  Location: [place] [facility];  Service: Cardiovascular;  Laterality: N/A;   LIVER BIOPSY  2005   Done in [place], [place]. Chronic hepatitis with mild periportal inflammation, lobular unicellular necrosis and portal fibrosis. Grade 2, stage 1-2.   LOWER EXTREMITY ANGIOGRAPHY Right 06/03/2016   Procedure: Lower Extremity Angiography;  Surgeon: Waynetta Sandy, MD;  Location: [place] [facility];  Service: Cardiovascular;  Laterality: Right;   MAXIMUM ACCESS (MAS)POSTERIOR LUMBAR INTERBODY FUSION (PLIF) 1 LEVEL N/A 11/25/2013   Procedure: FOR MAXIMUM ACCESS (MAS) POSTERIOR LUMBAR INTERBODY FUSION (PLIF) 1 LEVEL;  Surgeon: Eustace Moore, MD;  Location: [place] [facility];  Service: Neurosurgery;  Laterality:  N/A;  FOR MAXIMUM ACCESS (MAS) POSTERIOR LUMBAR INTERBODY FUSION (PLIF) 1 LEVEL LUMBAR 3-4   PERIPHERAL VASCULAR ATHERECTOMY Right 06/03/2016   Procedure: Peripheral Vascular Atherectomy;  Surgeon: Waynetta Sandy, MD;  Location: [place] [facility];  Service: Cardiovascular;  Laterality: Right;  SFA WITH STENT   PERIPHERAL VASCULAR CATHETERIZATION Left 08/01/2015   Procedure: Lower Extremity Angiography;  Surgeon: Serafina Mitchell, MD;  Location: [place] [facility];  Service: Cardiovascular;  Laterality: Left;   PERIPHERAL VASCULAR CATHETERIZATION N/A 08/01/2015   Procedure: Abdominal Aortogram;  Surgeon: Serafina Mitchell, MD;  Location: [place] [facility];  Service: Cardiovascular;  Laterality: N/A;   PERIPHERAL VASCULAR CATHETERIZATION N/A 08/08/2015   Procedure: Abdominal Aortogram w/Lower Extremity;  Surgeon: Serafina Mitchell, MD;  Location: [place];  Service: Cardiovascular;  Laterality: N/A;   PERIPHERAL VASCULAR CATHETERIZATION Left 08/08/2015   Procedure: Peripheral Vascular Balloon Angioplasty;  Surgeon: Serafina Mitchell, MD;  Location: [place] [facility];  Service: Cardiovascular;  Laterality: Left;  left popiteal artery, left peronealtrunk, left post tibial   POLYPECTOMY N/A 09/03/2012   Procedure: POLYPECTOMY;  Surgeon: Daneil Dolin, MD;  [facility];  Service: Endoscopy;  Laterality: N/A;  cecal polyp   SUBMUCOSAL TATTOO INJECTION  04/07/2020   Procedure: SUBMUCOSAL TATTOO INJECTION;  Surgeon: Lavena Bullion, DO;  Location: [place] [facility];  Service: Gastroenterology;;   TONSILLECTOMY     TRANSMETATARSAL AMPUTATION Right 06/13/2016   Procedure: RIGHT TRANSMETATARSAL AMPUTATION;  Surgeon: Waynetta Sandy, MD;  Location: [place];  Service: Vascular;  Laterality: Right;   TRANSMETATARSAL AMPUTATION Right 07/10/2016   Procedure: REVISION RIGHT TRANSMETATARSAL AMPUTATION;  Surgeon: Waynetta Sandy, MD;  Location: [place];  Service: Vascular;  Laterality: Right;     Social History:  reports that he quit smoking about 5 years ago. His smoking use included cigarettes. He has a 47.00 pack-year smoking history. He has never used smokeless tobacco. He reports that he does not drink alcohol and does not use drugs.   Allergies  Allergen Reactions   No Known Allergies     Family History  Problem Relation Age of Onset   Breast cancer Mother    Heart failure Mother    Diabetes Father    Hypertension Father    Heart disease Father    Hyperlipidemia Father    Heart failure Father    Diabetes Sister    Hypertension Sister    Breast cancer Sister    Colon cancer Neg Hx    Liver disease Neg Hx      Prior to Admission medications   Medication Sig Start Date End Date Taking? Authorizing Provider  acetaminophen (TYLENOL) 325 MG tablet Take 2 tablets (650 mg total) by mouth every 6 (six) hours as needed for mild pain or headache (fever >/= 101). 02/24/19   Roxan Hockey, MD  aspirin EC 81 MG tablet Take 1 tablet (81 mg total) by mouth daily with breakfast. 02/24/19   Roxan Hockey, MD  baclofen (LIORESAL) 10 MG tablet Take 1 tablet (10 mg total) by mouth every 8 (eight) hours as needed for muscle spasms. For shoulder spasm 06/03/20   Manuella Ghazi,  Pratik D, DO  carvedilol (COREG) 25 MG tablet Take 1 tablet (25 mg total) by mouth 2 (two) times daily with a meal. 04/08/20   Regalado, Belkys A, MD  cloNIDine (CATAPRES) 0.1 MG tablet Take 0.1 mg by mouth at bedtime.    [provider]  clopidogrel (PLAVIX) 75 MG tablet Take 75 mg by mouth daily.    [provider]  DULoxetine (CYMBALTA) 60 MG capsule Take 60 mg by mouth daily.    [provider]  ferrous gluconate (FERGON) 324 MG tablet Take 324 mg by mouth daily with breakfast.    [provider]  furosemide (LASIX) 20 MG tablet Take 80 mg by mouth daily. 06/09/20   [provider]  gabapentin (NEURONTIN) 300 MG capsule Take 300 mg by mouth 3 (three) times daily.     [provider]  glycerin adult 2 g suppository Place 1 suppository rectally as needed for constipation. 07/04/20   Tacy Learn, PA-C  hydrALAZINE (APRESOLINE) 50 MG tablet Take 1 tablet (50 mg total) by mouth daily. 06/24/20   Barton Dubois, MD  HYDROcodone-acetaminophen (NORCO/VICODIN) 5-325 MG tablet Take 1 tablet by mouth 4 (four) times daily as needed. 06/26/20   [provider]  insulin aspart (NOVOLOG) 100 UNIT/ML injection Inject into the skin. 07/05/20   [provider]  insulin glargine (LANTUS) 100 UNIT/ML injection Inject 0.18 mLs (18 Units total) into the skin at bedtime. 06/24/20   Barton Dubois, MD  insulin lispro (HUMALOG) 100 UNIT/ML injection Inject 0.05 mLs (5 Units total) into the skin in the morning, at noon, and at bedtime. Give only if eats 50% or more of meal. Patient taking differently: Inject 8 Units into the skin in the morning, at noon, and at bedtime. Give only if eats 50% or more of meal. 06/24/20   Barton Dubois, MD  ipratropium-albuterol (DUONEB) 0.5-2.5 (3) MG/3ML SOLN Take 3 mLs by nebulization every 6 (six) hours as needed (SOB/ wheezing).    [provider]  iron polysaccharides (NIFEREX) 150 MG capsule Take 1 capsule (150 mg total) by mouth 2 (two) times daily. 06/24/20   Barton Dubois, MD  isosorbide mononitrate (IMDUR) 120 MG 24 hr tablet Take 1 tablet (120 mg total) by mouth daily. 04/08/20   Regalado, Belkys A, MD  Lidocaine 4 % PTCH Apply 1 patch topically daily. Apply 1 patch to both stumps once daily, 12 hour use    [provider]  loratadine (CLARITIN) 10 MG tablet Take 10 mg by mouth daily.    [provider]  Melatonin 5 MG TABS Take 1 tablet by mouth at bedtime.    [provider]  NOVOLOG FLEXPEN 100 UNIT/ML FlexPen Inject into the skin. 07/18/20   [provider]  omeprazole (PRILOSEC) 20 MG capsule Take 1 capsule by mouth 2 (two) times daily. 07/03/20   [provider]   pantoprazole (PROTONIX) 40 MG tablet Take 1 tablet (40 mg total) by mouth 2 (two) times daily. 06/24/20   Barton Dubois, MD  polyethylene glycol (MIRALAX) 17 g packet Take 17 g by mouth daily. 07/04/20   Tacy Learn, PA-C  rosuvastatin (CRESTOR) 20 MG tablet Take 1 tablet (20 mg total) by mouth daily. Patient taking differently: Take 20 mg by mouth at bedtime. 04/08/20   Regalado, Belkys A, MD  senna (SENOKOT) 8.6 MG tablet Take 1 tablet by mouth 2 (two) times daily.    [provider]  sodium phosphate (FLEET) 7-19 GM/118ML ENEM Place  1 enema rectally daily as needed for severe constipation.    [provider]  tamsulosin (FLOMAX) 0.4 MG CAPS capsule Take 1 capsule (0.4 mg total) by mouth 2 (two) times daily. 07/25/20   McKenzie, Candee Furbish, MD  vitamin B-12 (CYANOCOBALAMIN) 500 MCG tablet Take 500 mcg by mouth daily.     [provider]    Physical Exam: BP (!) 143/86   Pulse 86   Temp 98.2 F (36.8 C) (Rectal)   Resp 13   Ht 6\' 1"  (1.854 m)   Wt 114 kg   SpO2 100%   BMI 33.16 kg/m   General: 70 y.o. year-old male well developed obese in no acute distress.  Alert and oriented x3. HEENT: NCAT, EOMI Neck: Supple, trachea medial Cardiovascular: Regular rate and rhythm with no rubs or gallops.  No thyromegaly or JVD noted.   Respiratory: Bilateral rales in lower lobes on auscultation with no wheezes  Abdomen: Soft, nontender nondistended with normal bowel sounds x4 quadrants. Muskuloskeletal: Bilateral BKA.  No cyanosis, clubbing or edema noted bilaterally Neuro: CN II-XII intact, sensation and reflexes intact Skin: No ulcerative lesions noted or rashes Psychiatry:  Mood is appropriate for condition and setting          Labs on Admission:  Basic Metabolic Panel: Recent Labs  Lab 07/30/20 0224  NA 132*  K 3.6  CL 95*  CO2 34*  GLUCOSE 325*  BUN 21  CREATININE 1.58*  CALCIUM 8.4*   Liver Function Tests: Recent Labs  Lab 07/30/20 0224  AST  14*  ALT 12  ALKPHOS 54  BILITOT 0.0*  PROT 10.4*  ALBUMIN 2.7*   Recent Labs  Lab 07/30/20 0224  LIPASE 20   No results for input(s): AMMONIA in the last 168 hours. CBC: Recent Labs  Lab 07/30/20 0224  WBC 4.7  NEUTROABS 3.2  HGB 7.2*  HCT 23.5*  MCV 99.2  PLT 220   Cardiac Enzymes: No results for input(s): CKTOTAL, CKMB, CKMBINDEX, TROPONINI in the last 168 hours.  BNP (last 3 results) Recent Labs    06/10/20 1226 07/07/20 1343 07/30/20 0224  BNP 211.0* 326.0* 313.0*    ProBNP (last 3 results) No results for input(s): PROBNP in the last 8760 hours.  CBG: No results for input(s): GLUCAP in the last 168 hours.  Radiological Exams on Admission: DG Chest Portable 1 View  Result Date: 07/30/2020 CLINICAL DATA:  Short of breath for 1 day, tobacco abuse EXAM: PORTABLE CHEST 1 VIEW COMPARISON:  07/07/2020 FINDINGS: 2 frontal views of the chest demonstrate a stable cardiac silhouette. Patchy retrocardiac consolidation at the medial costophrenic angle could reflect focal bronchopneumonia or atelectasis. No effusion or pneumothorax. No acute bony abnormalities. IMPRESSION: 1. Patchy retrocardiac consolidation which could reflect bronchopneumonia. Electronically Signed   By: Randa Ngo M.D.   On: 07/30/2020 01:49    EKG: I independently viewed the EKG done and my findings are as followed: Normal sinus rhythm at a rate of 89 bpm with short PR interval and repolarization abnormality  Assessment/Plan Present on Admission:  NSTEMI (non-ST elevated myocardial infarction) ([place])  Acute on chronic anemia  Hyponatremia  Chronic kidney disease, stage 3 (HCC)  BPH (benign prostatic hyperplasia)  Acute blood loss anemia  COPD (chronic obstructive pulmonary disease) (HCC)  Essential hypertension  GERD (gastroesophageal reflux disease)  HLD (hyperlipidemia)  PAD (peripheral artery disease) (HCC)  Acute on chronic respiratory failure with hypoxia (HCC)  Active Problems:    Acute blood loss anemia  Chronic kidney disease, stage 3 (HCC)   Essential hypertension   Hyponatremia   HLD (hyperlipidemia)   BPH (benign prostatic hyperplasia)   GERD (gastroesophageal reflux disease)   PAD (peripheral artery disease) (HCC)   Acute on chronic respiratory failure with hypoxia (HCC)   NSTEMI (non-ST elevated myocardial infarction) (HCC)   Acute on chronic systolic CHF (congestive heart failure) (HCC)   Acute on chronic anemia   COPD (chronic obstructive pulmonary disease) (HCC)   Hypoalbuminemia   Elevated d-dimer   Elevated brain natriuretic peptide (BNP) level   Elevated troponin I level   Hyperglycemia due to diabetes mellitus (HCC)   NSTEMI Elevated troponin level HS troponin 812 >1,116 EKG personally reviewed showed normal sinus rhythm at a rate of 89 bpm with short PR interval Patient denies chest pain at this time, but he complained of central chest pain when his symptoms started with shortness of breath Patient had catheterization in February of this year where he had 70% distal left main stenosis and ostial LAD stenosis, 40% proximal RCA stenosis and 99% circumflex stenosis.  PCI was not done at that time due to patient being considered as a candidate. Continue to trend troponin Cardiology at [facility] was already consulted by ED physician and patient will be transferred to [facility] for further evaluation and management. Please notify Cardiology team when patient arrives at [facility]  Acute on chronic respiratory failure with hypoxia due to acute on chronic systolic CHF Elevated BNP Patient complained of shortness of breath and required increased supplemental oxygen via [place] IV Lasix 20 Mg x1 was given Continue IV Lasix 40 twice daily Continue Coreg, Crestor, Imdur (as tolerated by BP) Continue total input/output, daily weights and fluid restriction Continue Cardiac diet  Echocardiogram done on 03/20/2020 showed LVEF of 40 to 45% and LV demonstrates RWMA  Acute on  chronic blood loss anemia Acute on chronic anemia/acute symptomatic anemia H/H= 7.2/23.5, this was 8.0/26.5 on 07/21/2020 EGD and video capsule has been done without obvious source of patient's blood loss 1 unit of PRBC will be transfused Continue Niferex  Hyperglycemia secondary to poorly controlled T2DM Continue ISS and hypoglycemic protocol Continue Lantus 8 units nightly and adjust dose based on blood glucose fluctuations  Elevated D-dimer D-dimer 1.31 CT angiography of the chest was not done due to patient's kidney status VQ scan will be done in the morning  Hyponatremia Na 132; sodium is within baseline range  Questionable bronchopneumonia Chest x-ray showed patchy retrocardiac consolidation which could reflect bronchopneumonia Patient denies fever and there was no elevated WBC.  Procalcitonin will be checked  Hypoalbuminemia possibly secondary to moderate protein calorie malnutrition Protein supplement will be provided  CKD stage 3A BUN to creatinine 21/1.58 (baseline creatinine at 1.3-1.8) Renally adjust medications, avoid nephrotoxic agents/dehydration/hypotension  Essential hypertension Continue IV Lasix, Coreg and hydralazine (as tolerated by BP)  Hyperlipidemia Continue Crestor  BPH Continue Flomax  Constipation Continue MiraLAX  GERD Continue Protonix  CAD/PAD Continue aspirin, Plavix  COPD not in acute exacerbation Continue DuoNeb Continue supplemental oxygen per home regimen   DVT prophylaxis: None at this time; Patient has BKA (heparin drip pending blood transfusion)  Code Status: DNR  Family Communication: None at bedside  Disposition Plan:  Patient is from:                        home Anticipated DC to:  SNF or family members home Anticipated DC date:               2-3 days Anticipated DC barriers:         patient requires inpatient management due to NSTEMI, CHF and symptomatic anemia  Consults called: Cardiology    Admission status: Inpatient     Bernadette Hoit MD [facility]  07/30/2020, 7:09 AM

## 2020-07-31 ENCOUNTER — Ambulatory Visit (HOSPITAL_COMMUNITY): Payer: Medicare (Managed Care)

## 2020-07-31 ENCOUNTER — Inpatient Hospital Stay (HOSPITAL_COMMUNITY): Payer: Medicare (Managed Care)

## 2020-07-31 DIAGNOSIS — I25110 Atherosclerotic heart disease of native coronary artery with unstable angina pectoris: Secondary | ICD-10-CM

## 2020-07-31 DIAGNOSIS — I214 Non-ST elevation (NSTEMI) myocardial infarction: Secondary | ICD-10-CM | POA: Diagnosis not present

## 2020-07-31 DIAGNOSIS — Z66 Do not resuscitate: Secondary | ICD-10-CM

## 2020-07-31 LAB — COMPREHENSIVE METABOLIC PANEL
ALT: 10 U/L (ref 0–44)
AST: 17 U/L (ref 15–41)
Albumin: 2.4 g/dL — ABNORMAL LOW (ref 3.5–5.0)
Alkaline Phosphatase: 38 U/L (ref 38–126)
Anion gap: 4 — ABNORMAL LOW (ref 5–15)
BUN: 16 mg/dL (ref 8–23)
CO2: 37 mmol/L — ABNORMAL HIGH (ref 22–32)
Calcium: 8.8 mg/dL — ABNORMAL LOW (ref 8.9–10.3)
Chloride: 94 mmol/L — ABNORMAL LOW (ref 98–111)
Creatinine, Ser: 1.39 mg/dL — ABNORMAL HIGH (ref 0.61–1.24)
GFR, Estimated: 56 mL/min — ABNORMAL LOW (ref >60)
Glucose, Bld: 206 mg/dL — ABNORMAL HIGH (ref 70–99)
Potassium: 3.6 mmol/L (ref 3.5–5.1)
Sodium: 135 mmol/L (ref 135–145)
Total Bilirubin: 0.4 mg/dL (ref 0.3–1.2)
Total Protein: 10 g/dL — ABNORMAL HIGH (ref 6.5–8.1)

## 2020-07-31 LAB — APTT: aPTT: 36 seconds (ref 24–36)

## 2020-07-31 LAB — BPAM RBC
Blood Product Expiration Date: 202207202359
ISSUE DATE / TIME: 202206191320
Unit Type and Rh: 9500

## 2020-07-31 LAB — CBC
HCT: 24.8 % — ABNORMAL LOW (ref 39.0–52.0)
Hemoglobin: 7.8 g/dL — ABNORMAL LOW (ref 13.0–17.0)
MCH: 29.8 pg (ref 26.0–34.0)
MCHC: 31.5 g/dL (ref 30.0–36.0)
MCV: 94.7 fL (ref 80.0–100.0)
Platelets: 222 10*3/uL (ref 150–400)
RBC: 2.62 MIL/uL — ABNORMAL LOW (ref 4.22–5.81)
RDW: 14.4 % (ref 11.5–15.5)
WBC: 5.3 10*3/uL (ref 4.0–10.5)
nRBC: 0 % (ref 0.0–0.2)

## 2020-07-31 LAB — PROTIME-INR
INR: 1.2 (ref 0.8–1.2)
Prothrombin Time: 14.7 seconds (ref 11.4–15.2)

## 2020-07-31 LAB — GLUCOSE, CAPILLARY
Glucose-Capillary: 205 mg/dL — ABNORMAL HIGH (ref 70–99)
Glucose-Capillary: 204 mg/dL — ABNORMAL HIGH (ref 70–99)
Glucose-Capillary: 348 mg/dL — ABNORMAL HIGH (ref 70–99)
Glucose-Capillary: 299 mg/dL — ABNORMAL HIGH (ref 70–99)

## 2020-07-31 LAB — SARS CORONAVIRUS 2 (TAT 6-24 HRS): SARS Coronavirus 2: NEGATIVE

## 2020-07-31 LAB — TYPE AND SCREEN
ABO/RH(D): O POS
Antibody Screen: NEGATIVE
Unit division: 0

## 2020-07-31 LAB — BRAIN NATRIURETIC PEPTIDE: B Natriuretic Peptide: 628.9 pg/mL — ABNORMAL HIGH (ref 0.0–100.0)

## 2020-07-31 MED ORDER — HYDROCODONE-ACETAMINOPHEN 5-325 MG PO TABS
1.0000 | ORAL_TABLET | Freq: Four times a day (QID) | ORAL | Status: DC | PRN
Start: 2020-07-31 — End: 2020-07-31

## 2020-07-31 MED ORDER — OXYCODONE-ACETAMINOPHEN 5-325 MG PO TABS
1.0000 | ORAL_TABLET | Freq: Four times a day (QID) | ORAL | Status: DC | PRN
  Administered 2020-07-31 – 2020-08-02 (×5): 1 via ORAL
  Filled 2020-07-31 (×5): qty 1

## 2020-07-31 MED ORDER — HYDROCODONE-ACETAMINOPHEN 5-325 MG PO TABS
1.0000 | ORAL_TABLET | Freq: Once | ORAL | Status: AC
  Administered 2020-07-31: 1 via ORAL
  Filled 2020-07-31: qty 1

## 2020-07-31 MED ORDER — GABAPENTIN 300 MG PO CAPS
300.0000 mg | ORAL_CAPSULE | Freq: Two times a day (BID) | ORAL | Status: DC
  Administered 2020-07-31 – 2020-08-02 (×5): 300 mg via ORAL
  Filled 2020-07-31 (×6): qty 1

## 2020-07-31 MED ORDER — MORPHINE SULFATE (PF) 2 MG/ML IV SOLN
1.0000 mg | INTRAVENOUS | Status: DC | PRN
  Administered 2020-08-01: 1 mg via INTRAVENOUS
  Filled 2020-07-31: qty 1

## 2020-07-31 MED ORDER — MORPHINE SULFATE (PF) 2 MG/ML IV SOLN
1.0000 mg | INTRAVENOUS | Status: DC | PRN
Start: 2020-07-31 — End: 2020-07-31
  Administered 2020-07-31: 1 mg via INTRAVENOUS
  Filled 2020-07-31: qty 1

## 2020-07-31 MED ORDER — FUROSEMIDE 10 MG/ML IJ SOLN
40.0000 mg | Freq: Once | INTRAMUSCULAR | Status: AC
  Administered 2020-07-31: 40 mg via INTRAVENOUS
  Filled 2020-07-31: qty 4

## 2020-07-31 MED ORDER — HYDRALAZINE HCL 50 MG PO TABS
100.0000 mg | ORAL_TABLET | Freq: Every day | ORAL | Status: DC
  Administered 2020-07-31: 100 mg via ORAL
  Filled 2020-07-31: qty 2

## 2020-07-31 MED ORDER — HYDRALAZINE HCL 50 MG PO TABS
100.0000 mg | ORAL_TABLET | Freq: Three times a day (TID) | ORAL | Status: DC
  Administered 2020-07-31 – 2020-08-02 (×7): 100 mg via ORAL
  Filled 2020-07-31 (×7): qty 2

## 2020-07-31 MED ORDER — TECHNETIUM TO 99M ALBUMIN AGGREGATED
4.2000 | Freq: Once | INTRAVENOUS | Status: AC | PRN
  Administered 2020-07-31: 4.2 via INTRAVENOUS

## 2020-07-31 MED ORDER — SPIRONOLACTONE 25 MG PO TABS
25.0000 mg | ORAL_TABLET | Freq: Every day | ORAL | Status: DC
  Administered 2020-07-31 – 2020-08-02 (×3): 25 mg via ORAL
  Filled 2020-07-31 (×3): qty 1

## 2020-07-31 MED ORDER — CLONIDINE HCL 0.2 MG PO TABS
0.2000 mg | ORAL_TABLET | Freq: Two times a day (BID) | ORAL | Status: DC
  Administered 2020-07-31 – 2020-08-02 (×5): 0.2 mg via ORAL
  Filled 2020-07-31 (×5): qty 1

## 2020-07-31 NOTE — Progress Notes (Signed)
Palliative Medicine Inpatient Follow Up Note  Reason for consult:  Goals of Care and Code Status   HPI:  Per intake H&P --> Cory Weber is a 70 y.o. male with medical history significant for  chronic blood loss anemia, asthma, COPD, BPH, CAD, chronic diastolic heart failure, history of CVA, hypertension, GERD, hyperlipidemia, osteomyelitis, PAD with bilateral BKA, type II DM with diabetic peripheral neuropathy, chronic respiratory failure on 3 LPM of oxygen via [place] at baseline who presents to the emergency department via EMS due to shortness of breath (6/18). He was transferred to [facility] on 6/19 for further evaluation of NSTEMI. Per review of cardiology notes he is not a candidate for advanced therapies.   Palliative care was asked to get involved as at one point Cory Weber was a DNR and not has been converted back to a Full Code. Given his co-morbidities and present clinical condition we have been asked to further address goals of care.    Today's Discussion (07/31/2020):  *Please note that this is a verbal dictation therefore any spelling or grammatical errors are due to the "[employer]" system interpretation.  Chart reviewed. I met with Cory Weber this morning. He appeared more stable from a VS perspective. He himself endorses feeling "much better".  He shares that he does have chest pain but it is much improved as compared to last night.   I asked him to share with me more about himself. He states that he is close to his daughter, Cory Weber and his two granddaughters. He loves watching sports on television. He has been disabled for many years now though he "rolls with the punches". He shares that her does have quality in his life and has many friends at [employer].  I observed that yesterday was frightening for him in the setting of his chest pain. I said that now things have settled down and he is feeling more stable we should again address code status. We reviewed that Cory Weber would "never  want to be a burden" to anyone. He goes on to tell me the reason he has stayed at [facility] is to allow his daughter to live her life without worrying about him. I shared that it appears in the past he had not wanted CPR but did wish for other interventions such as those that we are doing now. He does agree with this and would never wish to be debilitated to the point where he is unaware of his surrounding. I shared with him again that pursuing all measures" on him could very likely result in outcomes he does not desire in addition to causing his body trauma. He acknowledges this. Patient now in agreement with DNR. Reviewed MOST as below which he is in complete agreement per verbal confirmation.  I completed a MOST form today. The patient and family outlined their wishes for the following treatment decisions:  Cardiopulmonary Resuscitation: Do Not Attempt Resuscitation (DNR/No CPR)  Medical Interventions: Limited Additional Interventions: Use medical treatment, IV fluids and cardiac monitoring as indicated, DO NOT USE intubation or mechanical ventilation. May consider use of less invasive airway support such as BiPAP or CPAP. Also provide comfort measures. Transfer to the hospital if indicated. Avoid intensive care.   Antibiotics: Antibiotics if indicated  IV Fluids: IV fluids if indicated  Feeding Tube: No feeding tube   Questions and concerns addressed   Objective Assessment: Vital Signs Vitals:   07/31/20 1009 07/31/20 1249  BP: (!) 161/84 (!) 130/106  Pulse:  83  Resp:  17  Temp:  98.4 F (36.9 C)  SpO2:      Intake/Output Summary (Last 24 hours) at 07/31/2020 1342 Last data filed at 07/31/2020 4932 Gross per 24 hour  Intake 373.91 ml  Output 400 ml  Net -26.09 ml   Last Weight  Most recent update: 07/30/2020  4:08 PM    Weight  116.3 kg (256 lb 6.4 oz)            Gen: Older AA M in severe distress HEENT: Dry mucous membranes CV: Irregular rate and rhythm PULM: On 4LPM [place],  tachypneic ABD: soft/nontender EXT: Bilatereal BKA's Neuro: Alert and oriented x3  Decision Maker: Cory Weber [phone]   SUMMARY OF RECOMMENDATIONS   DNAR/DNI  MOST Completed, paper copy placed onto the chart electric copy can be found in [employer]  DNR Form Completed, paper copy placed onto the chart electric copy can be found in [employer]yn[employer]  Patient is clinically unstable presently - a Rapid Response has been called  Cory Weber would be a good candidate for outpatient Palliative Care - will further discuss this with him  Ongoing incremental Palliative support  Time Spent: 45 Greater than 50% of the time was spent in counseling and coordination of care ______________________________________________________________________________________ [place] Team Team Cell Phone: [phone] Please utilize secure chat with additional questions, if there is no response within 30 minutes please call the above phone number  Palliative Medicine Team providers are available by phone from 7am to 7pm daily and can be reached through the team cell phone.  Should this patient require assistance outside of these hours, please call the patient's attending physician.

## 2020-07-31 NOTE — Progress Notes (Signed)
PROGRESS NOTE    Cory Weber  OAC:[id number] DOB: [birth year] DOA: 07/29/2020 PCP: Caprice Renshaw, MD   Brief Narrative:  Cory Weber is a 70 y.o. male with medical history significant for  chronic blood loss anemia, asthma, COPD, BPH, CAD, chronic diastolic heart failure, history of CVA, hypertension, GERD, hyperlipidemia, osteomyelitis, PAD with bilateral BKA, type II DM with diabetic peripheral neuropathy, chronic respiratory failure on 3 LPM of oxygen via [place] at baseline who presents to the emergency department via EMS due to shortness of breath last night (6/18).  Patient states that he became short of breath just prior to going to bed at the living facility last night, EMS was activated and on arrival of EMS team, patient was noted to be hypoxic with an O2 sat of 86%, he was also diaphoretic, he was then placed on NRB with improved saturation to 100%.  He denies chest pain, nausea, vomiting fever or chills.    Assessment & Plan:   Active Problems:   Acute blood loss anemia   Constipation   Chronic kidney disease, stage 3 (HCC)   Essential hypertension   Hyponatremia   HLD (hyperlipidemia)   BPH (benign prostatic hyperplasia)   GERD (gastroesophageal reflux disease)   PAD (peripheral artery disease) (HCC)   Acute on chronic respiratory failure with hypoxia (HCC)   NSTEMI (non-ST elevated myocardial infarction) (HCC)   Acute on chronic systolic CHF (congestive heart failure) (HCC)   Acute on chronic anemia   COPD (chronic obstructive pulmonary disease) (HCC)   Symptomatic anemia   Hypoalbuminemia   Elevated d-dimer   Elevated brain natriuretic peptide (BNP) level   Elevated troponin I level   Hyperglycemia due to diabetes mellitus (HCC)   CAD (coronary artery disease)   Acute on chronic respiratory failure with hypoxia due to acute on chronic systolic CHF Concurrent Hypertensive emergency with subsequent flash pulmonary edema -Rapid response late last night, placed on  Bipap HS --> weaned to 3L this morning with profound improvement after blood pressure is controlled -Continue to wean oxygen as tolerated -Cardiology following, appreciate insight and recommendations -Continue Coreg 25, clonidine 0.2 twice daily, hydralazine 100mg  3 times daily, isosorbide 180, spironolactone 25 -Hold off on further diuretics  NSTEMI, Associated chest pain -Troponin elevated in the setting of hypertensive emergency, flash pulmonary edema, hypoxia with known obstructive CAD -continue aspirin Plavix -As above cardiology following -not a candidate for intervention at this time, will continue aggressive medical management per discussion with cardiology -Patient had catheterization in February of this year where he had 70% distal left main stenosis and ostial LAD stenosis, 40% proximal RCA stenosis and 99% circumflex stenosis. PCI was not done at that time due to patient being considered as a candidate. Continue to trend troponin   Acute symptomatic anemia, resolved Acute blood loss anemia ruled out Chronic anemia of chronic disease/malnutrition ongoing - EGD and video capsule has been done without obvious source of patient's blood loss -Status post 1 unit of PRBC  Hyperglycemia secondary to poorly controlled T2DM Continue ISS and hypoglycemic protocol Continue Lantus 8 units nightly and adjust dose based on blood glucose fluctuations   Elevated D-dimer D-dimer 1.31 CT angiography of the chest was not done due to patient's kidney status VQ scan negative   Questionable bronchopneumonia ruled out Chest x-ray shows questionable patchy consolidation, procalcitonin reassuringly negative, no fevers chills or white count at this point  Hypoalbuminemia secondary to moderate protein calorie malnutrition Protein supplement will be provided  CKD stage  3A -Currently at baseline, continue to follow   Hyperlipidemia Continue Crestor  BPH Continue Flomax   GERD Continue  Protonix   COPD not in acute exacerbation Continue DuoNeb Continue supplemental oxygen per home regimen    DVT prophylaxis: None currently, heparin drip discontinued; no SCDs given bilateral amputation Code Status: DNR Family Communication: None present  Status is: Inpatient  Dispo: The patient is from: [facility]              Anticipated d/c is to: Same              Anticipated d/c date is: 48 to 72 hours              Patient currently not medically stable for discharge  Consultants:  Cardiology, palliative care  Procedures:  None  Antimicrobials:  None indicated  Subjective: Rapid response late yesterday evening improved with nitro drip and BiPAP, likely secondary to hypertensive emergency which is now resolved after titration of patient's p.o. medications.  He denies any further shortness of breath chest pain nausea vomiting diarrhea constipation headache fevers or chills.  Objective: Vitals:   07/30/20 2300 07/31/20 0004 07/31/20 0323 07/31/20 0425  BP: (!) 165/93  (!) 154/85 (!) 157/87  Pulse: 92 91 99 95  Resp: 19 (!) 22 20 20   Temp:   98.4 F (36.9 C) 98.4 F (36.9 C)  TempSrc:   Oral Oral  SpO2: 94% 99% 100% 99%  Weight:      Height:        Intake/Output Summary (Last 24 hours) at 07/31/2020 0802 Last data filed at 07/31/2020 0323 Gross per 24 hour  Intake 373.91 ml  Output 400 ml  Net -26.09 ml   Filed Weights   07/30/20 0007 07/30/20 1600  Weight: 114 kg 116.3 kg    Examination:  General:  Pleasantly resting in bed, No acute distress. HEENT:  Normocephalic atraumatic.  Sclerae nonicteric, noninjected.  Extraocular movements intact bilaterally. Neck:  Without mass or deformity.  Trachea is midline. Lungs:  Clear to auscultate bilaterally without rhonchi, wheeze, or rales. Heart:  Regular rate and rhythm.  Without murmurs, rubs, or gallops. Abdomen:  Soft, nontender, nondistended.  Without guarding or rebound. Extremities: Bilateral  BKA. Vascular:  Dorsalis pedis and posterior tibial pulses palpable bilaterally. Skin:  Warm and dry, no erythema, no ulcerations.    Data Reviewed: I have personally reviewed following labs and imaging studies  CBC: Recent Labs  Lab 07/30/20 0224  WBC 4.7  NEUTROABS 3.2  HGB 7.2*  HCT 23.5*  MCV 99.2  PLT 703   Basic Metabolic Panel: Recent Labs  Lab 07/30/20 0224 07/30/20 0406  NA 132*  --   K 3.6  --   CL 95*  --   CO2 34*  --   GLUCOSE 325*  --   BUN 21  --   CREATININE 1.58*  --   CALCIUM 8.4*  --   MG  --  1.9  PHOS  --  4.5   GFR: Estimated Creatinine Clearance: 60.6 mL/min (A) (by C-G formula based on SCr of 1.58 mg/dL (H)). Liver Function Tests: Recent Labs  Lab 07/30/20 0224  AST 14*  ALT 12  ALKPHOS 54  BILITOT 0.0*  PROT 10.4*  ALBUMIN 2.7*   Recent Labs  Lab 07/30/20 0224  LIPASE 20   No results for input(s): AMMONIA in the last 168 hours. Coagulation Profile: No results for input(s): INR, PROTIME in the last 168 hours. Cardiac  Enzymes: No results for input(s): CKTOTAL, CKMB, CKMBINDEX, TROPONINI in the last 168 hours. BNP (last 3 results) No results for input(s): PROBNP in the last 8760 hours. HbA1C: No results for input(s): HGBA1C in the last 72 hours. CBG: Recent Labs  Lab 07/30/20 0850 07/30/20 1425 07/30/20 2113 07/31/20 0749  GLUCAP 170* 205* 167* 205*   Lipid Profile: No results for input(s): CHOL, HDL, LDLCALC, TRIG, CHOLHDL, LDLDIRECT in the last 72 hours. Thyroid Function Tests: No results for input(s): TSH, T4TOTAL, FREET4, T3FREE, THYROIDAB in the last 72 hours. Anemia Panel: No results for input(s): VITAMINB12, FOLATE, FERRITIN, TIBC, IRON, RETICCTPCT in the last 72 hours. Sepsis Labs: Recent Labs  Lab 07/30/20 0224 07/30/20 0406 07/30/20 0407  PROCALCITON  --  <0.10  --   LATICACIDVEN 1.2  --  0.9    Recent Results (from the past 240 hour(s))  Blood culture (routine x 2)     Status: None (Preliminary  result)   Collection Time: 07/30/20  2:24 AM   Specimen: BLOOD RIGHT HAND  Result Value Ref Range Status   Specimen Description BLOOD RIGHT HAND  Final   Special Requests   Final    BOTTLES DRAWN AEROBIC AND ANAEROBIC Blood Culture adequate volume   Culture   Final    NO GROWTH 1 DAY Performed at [facility], [address], [place], [place] [address]    Report Status PENDING  Incomplete  Blood culture (routine x 2)     Status: None (Preliminary result)   Collection Time: 07/30/20  2:35 AM   Specimen: BLOOD RIGHT ARM  Result Value Ref Range Status   Specimen Description BLOOD RIGHT ARM  Final   Special Requests   Final    BOTTLES DRAWN AEROBIC AND ANAEROBIC Blood Culture adequate volume   Culture   Final    NO GROWTH 1 DAY Performed at [facility], [address] [place], [place] [address]    Report Status PENDING  Incomplete  SARS CORONAVIRUS 2 (TAT 6-24 HRS) Nasopharyngeal Nasopharyngeal Swab     Status: None   Collection Time: 07/30/20  3:39 AM   Specimen: Nasopharyngeal Swab  Result Value Ref Range Status   SARS Coronavirus 2 NEGATIVE NEGATIVE Final    Comment: (NOTE) SARS-CoV-2 target nucleic acids are NOT DETECTED.  The SARS-CoV-2 RNA is generally detectable in upper and lower respiratory specimens during the acute phase of infection. Negative results do not preclude SARS-CoV-2 infection, do not rule out co-infections with other pathogens, and should not be used as the sole basis for treatment or other patient management decisions. Negative results must be combined with clinical observations, patient history, and epidemiological information. The expected result is Negative.  Fact Sheet for Patients: SugarRoll.be  Fact Sheet for Healthcare Providers: https://www.woods-mathews.com/  This test is not yet approved or cleared by the [place] FDA and  has been authorized for detection and/or diagnosis of SARS-CoV-2 by FDA  under an Emergency Use Authorization (EUA). This EUA will remain  in effect (meaning this test can be used) for the duration of the COVID-19 declaration under Se ction 564(b)(1) of the Act, 21 U.S.C. section 360bbb-3(b)(1), unless the authorization is terminated or revoked sooner.  Performed at [facility], [place] [address] [place], [place] [address]          Radiology Studies: DG Chest Port 1 View  Result Date: 07/30/2020 CLINICAL DATA:  Shortness of breath. EXAM: PORTABLE CHEST 1 VIEW COMPARISON:  Earlier today. FINDINGS: Stable heart size  and mediastinal contours. The previous retrocardiac opacity is not as well-defined on the current exam due to portable technique, but not significantly changed. No new or progressive airspace disease. There is bronchial thickening. Possible small effusions with fluid in the minor fissure. No pneumothorax. IMPRESSION: 1. Retrocardiac opacity on exam earlier today is not as well-defined on the currently due to portable technique, but not significantly changed. 2. Bronchial thickening and possible developing small effusions. Electronically Signed   By: Keith Rake M.D.   On: 07/30/2020 19:35   DG Chest Portable 1 View  Result Date: 07/30/2020 CLINICAL DATA:  Short of breath for 1 day, tobacco abuse EXAM: PORTABLE CHEST 1 VIEW COMPARISON:  07/07/2020 FINDINGS: 2 frontal views of the chest demonstrate a stable cardiac silhouette. Patchy retrocardiac consolidation at the medial costophrenic angle could reflect focal bronchopneumonia or atelectasis. No effusion or pneumothorax. No acute bony abnormalities. IMPRESSION: 1. Patchy retrocardiac consolidation which could reflect bronchopneumonia. Electronically Signed   By: Randa Ngo M.D.   On: 07/30/2020 01:49        Scheduled Meds:  sodium chloride   Intravenous Once   aspirin EC  81 mg Oral Q breakfast   carvedilol  25 mg Oral BID WC   carvedilol  25 mg Oral BID WC   clopidogrel  75 mg  Oral Daily   feeding supplement (GLUCERNA SHAKE)  237 mL Oral TID BM   furosemide  80 mg Oral Daily   gabapentin  300 mg Oral BID   hydrALAZINE  50 mg Oral Daily   insulin aspart  0-5 Units Subcutaneous QHS   insulin aspart  0-9 Units Subcutaneous TID WC   insulin glargine  8 Units Subcutaneous QHS   iron polysaccharides  150 mg Oral BID   isosorbide mononitrate  180 mg Oral Daily   melatonin  6 mg Oral QHS   pantoprazole  40 mg Oral BID   polyethylene glycol  17 g Oral Daily   rosuvastatin  20 mg Oral QHS   tamsulosin  0.4 mg Oral Daily   vitamin B-12  500 mcg Oral Daily   Continuous Infusions:  nitroGLYCERIN 40 mcg/min (07/30/20 2000)     LOS: 1 day   Time spent: 70min  Zanylah Hardie C Riverlyn Kizziah, DO [facility]  If 7PM-7AM, please contact night-coverage [email or web address]  07/31/2020, 8:02 AM

## 2020-07-31 NOTE — Progress Notes (Signed)
Bi-Pap removed and pt placed on [place] @ 3L, maintaining sats @ 100%. Will continue to monitor.

## 2020-07-31 NOTE — Progress Notes (Signed)
Cardiology Progress Note  Patient ID: Cory Weber MRN: [id number] DOB: 1950-07-12 Date of Encounter: 07/31/2020  Primary Cardiologist: Rozann Lesches, MD  Subjective   Chief Complaint: None.  HPI: Off BiPAP.  Breathing improved.  On 3 to 4 L of oxygen.  He reports he still has 5 out of 10 chest pain.  Described as sharp pain in the center of his chest.  He seems to be resting comfortably.  ROS:  All other ROS reviewed and negative. Pertinent positives noted in the HPI.     Inpatient Medications  Scheduled Meds:  sodium chloride   Intravenous Once   aspirin EC  81 mg Oral Q breakfast   carvedilol  25 mg Oral BID WC   cloNIDine  0.2 mg Oral BID   clopidogrel  75 mg Oral Daily   feeding supplement (GLUCERNA SHAKE)  237 mL Oral TID BM   furosemide  40 mg Intravenous Once   gabapentin  300 mg Oral BID   hydrALAZINE  100 mg Oral Daily   insulin aspart  0-5 Units Subcutaneous QHS   insulin aspart  0-9 Units Subcutaneous TID WC   insulin glargine  8 Units Subcutaneous QHS   iron polysaccharides  150 mg Oral BID   isosorbide mononitrate  180 mg Oral Daily   melatonin  6 mg Oral QHS   pantoprazole  40 mg Oral BID   polyethylene glycol  17 g Oral Daily   rosuvastatin  20 mg Oral QHS   spironolactone  25 mg Oral Daily   tamsulosin  0.4 mg Oral Daily   vitamin B-12  500 mcg Oral Daily   Continuous Infusions:  PRN Meds: ipratropium-albuterol, morphine injection, nitroGLYCERIN   Vital Signs   Vitals:   07/30/20 2300 07/31/20 0004 07/31/20 0323 07/31/20 0425  BP: (!) 165/93  (!) 154/85 (!) 157/87  Pulse: 92 91 99 95  Resp: 19 (!) 22 20 20   Temp:   98.4 F (36.9 C) 98.4 F (36.9 C)  TempSrc:   Oral Oral  SpO2: 94% 99% 100% 99%  Weight:      Height:        Intake/Output Summary (Last 24 hours) at 07/31/2020 0858 Last data filed at 07/31/2020 0323 Gross per 24 hour  Intake 373.91 ml  Output 400 ml  Net -26.09 ml   Last 3 Weights 07/30/2020 07/30/2020 07/25/2020   Weight (lbs) 256 lb 6.4 oz 251 lb 5.2 oz (No Data)  Weight (kg) 116.302 kg 114 kg (No Data)      Telemetry  Overnight telemetry shows sinus rhythm in the 90s, which I personally reviewed.   ECG  The most recent ECG shows sinus rhythm in the 90s, nonspecific ST-T changes, likely LVH with repolarization abnormalities, which I personally reviewed.   Physical Exam   Vitals:   07/30/20 2300 07/31/20 0004 07/31/20 0323 07/31/20 0425  BP: (!) 165/93  (!) 154/85 (!) 157/87  Pulse: 92 91 99 95  Resp: 19 (!) 22 20 20   Temp:   98.4 F (36.9 C) 98.4 F (36.9 C)  TempSrc:   Oral Oral  SpO2: 94% 99% 100% 99%  Weight:      Height:        Intake/Output Summary (Last 24 hours) at 07/31/2020 0858 Last data filed at 07/31/2020 0323 Gross per 24 hour  Intake 373.91 ml  Output 400 ml  Net -26.09 ml    Last 3 Weights 07/30/2020 07/30/2020 07/25/2020  Weight (lbs) 256 lb 6.4 oz  251 lb 5.2 oz (No Data)  Weight (kg) 116.302 kg 114 kg (No Data)    Body mass index is 33.83 kg/m.   General: Well nourished, well developed, in no acute distress Head: Atraumatic, normal size  Eyes: PEERLA, EOMI  Neck: Supple, no JVD Endocrine: No thryomegaly Cardiac: Normal S1, S2; RRR; no murmurs, rubs, or gallops Lungs: Diminished breath sounds bilaterally Abd: Soft, nontender, no hepatomegaly  Ext: No edema, status post bilateral BKA Musculoskeletal: No deformities, BUE and BLE strength normal and equal Skin: Warm and dry, no rashes   Neuro: Alert and oriented to person, place, time, and situation, CNII-XII grossly intact, no focal deficits  Psych: Normal mood and affect   Labs  High Sensitivity Troponin:   Recent Labs  Lab 07/07/20 1343 07/07/20 1544 07/30/20 0224 07/30/20 0406 07/30/20 1626  TROPONINIHS 17 19* 812* 1,116* 1,539*     Cardiac EnzymesNo results for input(s): TROPONINI in the last 168 hours. No results for input(s): TROPIPOC in the last 168 hours.  Chemistry Recent Labs  Lab  07/30/20 0224  NA 132*  K 3.6  CL 95*  CO2 34*  GLUCOSE 325*  BUN 21  CREATININE 1.58*  CALCIUM 8.4*  PROT 10.4*  ALBUMIN 2.7*  AST 14*  ALT 12  ALKPHOS 54  BILITOT 0.0*  GFRNONAA 48*  ANIONGAP 3*    Hematology Recent Labs  Lab 07/30/20 0224  WBC 4.7  RBC 2.37*  HGB 7.2*  HCT 23.5*  MCV 99.2  MCH 30.4  MCHC 30.6  RDW 14.3  PLT 220   BNP Recent Labs  Lab 07/30/20 0224  BNP 313.0*    DDimer  Recent Labs  Lab 07/30/20 0224  DDIMER 1.31*     Radiology  DG Chest Port 1 View  Result Date: 07/30/2020 CLINICAL DATA:  Shortness of breath. EXAM: PORTABLE CHEST 1 VIEW COMPARISON:  Earlier today. FINDINGS: Stable heart size and mediastinal contours. The previous retrocardiac opacity is not as well-defined on the current exam due to portable technique, but not significantly changed. No new or progressive airspace disease. There is bronchial thickening. Possible small effusions with fluid in the minor fissure. No pneumothorax. IMPRESSION: 1. Retrocardiac opacity on exam earlier today is not as well-defined on the currently due to portable technique, but not significantly changed. 2. Bronchial thickening and possible developing small effusions. Electronically Signed   By: Keith Rake M.D.   On: 07/30/2020 19:35   DG Chest Portable 1 View  Result Date: 07/30/2020 CLINICAL DATA:  Short of breath for 1 day, tobacco abuse EXAM: PORTABLE CHEST 1 VIEW COMPARISON:  07/07/2020 FINDINGS: 2 frontal views of the chest demonstrate a stable cardiac silhouette. Patchy retrocardiac consolidation at the medial costophrenic angle could reflect focal bronchopneumonia or atelectasis. No effusion or pneumothorax. No acute bony abnormalities. IMPRESSION: 1. Patchy retrocardiac consolidation which could reflect bronchopneumonia. Electronically Signed   By: Randa Ngo M.D.   On: 07/30/2020 01:49    Cardiac Studies  LHC 03/21/2020  1. Severe complex CAD. There is a tapering 70% stenosis in  the distal Left main involving as well the ostium of the LAD and ramus intermediate with hazy appearance. The LCx is a co-dominant vessel with extensive dissection involving the ostium and proximal vessel and is is subtotally occluded. 2. Moderate LV dysfunction. EF 40-45% 3. Severely elevated LVEDP 30 mm Hg.  TTE 03/20/2020  1. Apical hypokinesis. . Left ventricular ejection fraction, by  estimation, is 40 to 45%. The left ventricle has mildly  decreased  function. The left ventricle demonstrates regional wall motion  abnormalities (see scoring diagram/findings for  description). There is mild left ventricular hypertrophy. Left ventricular  diastolic parameters are indeterminate.   2. Right ventricular systolic function is normal. The right ventricular  size is normal.   3. Left atrial size was mildly dilated.   4. The mitral valve is normal in structure. No evidence of mitral valve  regurgitation. No evidence of mitral stenosis.   5. The aortic valve has an indeterminant number of cusps. There is mild  calcification of the aortic valve. There is mild thickening of the aortic  valve. Aortic valve regurgitation is not visualized. No aortic stenosis is  present.   6. The inferior vena cava is normal in size with greater than 50%  respiratory variability, suggesting right atrial pressure of 3 mmHg.   Patient Profile  Cory Weber is a 70 y.o. male with diabetes, stroke, hepatitis C, recurrent GI bleed, severe multivessel CAD not amendable to PCI or CABG who was admitted on 07/29/2020 with acute hypoxic respiratory failure secondary to pulmonary edema in the setting of congestive heart failure.  Cardiology was consulted for non-STEMI.  Assessment & Plan   Acute hypoxic respiratory failure -Suspect he had pulmonary edema in the setting of severe multivessel CAD and congestive heart failure. -Off BiPAP -We will continue with 1 more round of IV Lasix.  He almost appears to be back to his  baseline oxygen level. -Procalcitonin negative.  I doubt he has pneumonia.  2.  Hypertensive crisis -Suspect his respiratory compensation was driven by this.  We will continue with diuresis as above. -I have increased his hydralazine to 100 mg 3 times daily. -Continue Coreg 25 twice daily -I have added back clonidine 0.2 mg twice daily -Continue Imdur 100 mg daily -He does have CKD stage III we will challenge him on Aldactone 25 mg daily to help with BP control. -IV Lasix today.  Will restart oral Lasix tomorrow.  3.  Non-STEMI/severe three-vessel CAD not amendable to PCI or CABG/ischemic cardiomyopathy -Unfortunately he has severe three-vessel CAD.  He has recurrent GI bleed secondary to AVMs.  He is undergone procedures for this.  He presents again with decompensated heart failure as well as anemia.  His troponins are elevated which are secondary to demand. -He has absolutely no options given his recurrent bleeding.  He is not a candidate for PCI or CABG. -His treatment moving forward is palliative.  I had a long discussion with him this morning.  I recommended hospice care.  He should discuss this with palliative care moving forward.  He understands that there is no way to treat his coronary disease in a safe manner. -He is on aspirin and Plavix.  He will continue this for now.  If hemoglobin continues to drop we will need to stop this.   -Transfuse to maintain hemoglobin above 8.  If hemoglobin continues to drop we will stop aspirin Plavix. -He is on antianginals including a beta-blocker and Imdur.  We will wean his nitro drip. -It is really unclear how pain-free we can make him given his severe CAD and recurrent GI bleeds. -We will recheck an echocardiogram.  I suspect his EF has dropped.  This will help with further prognostication as well as likely moving him towards hospice care.  4.  Recurrent anemia/GI blood loss -Transfuse to maintain hemoglobin over 8.  No options for addressing  his coronary disease as detailed above.  For questions or  updates, please contact [place] Please consult [email or web address] for contact info under   Time Spent with Patient: I have spent a total of 25 minutes with patient reviewing hospital notes, telemetry, EKGs, labs and examining the patient as well as establishing an assessment and plan that was discussed with the patient.  > 50% of time was spent in direct patient care.    Signed, Addison Naegeli. Audie Box, MD, [place]  07/31/2020 8:58 AM

## 2020-07-31 NOTE — Consult Note (Addendum)
Palliative Medicine Inpatient Consult Note  Reason for consult:  Goals of Care and Code Status  HPI:  Per intake H&P --> Cory Weber is a 70 y.o. male with medical history significant for  chronic blood loss anemia, asthma, COPD, BPH, CAD, chronic diastolic heart failure, history of CVA, hypertension, GERD, hyperlipidemia, osteomyelitis, PAD with bilateral BKA, type II DM with diabetic peripheral neuropathy, chronic respiratory failure on 3 LPM of oxygen via [place] at baseline who presents to the emergency department via EMS due to shortness of breath (6/18). He was transferred to [facility] on 6/19 for further evaluation of NSTEMI. Per review of cardiology notes he is not a candidate for advanced therapies.   Palliative care was asked to get involved as at one point Cory Weber was a DNR and not has been converted back to a Full Code. Given his co-morbidities and present clinical condition we have been asked to further address goals of care.   Clinical Assessment/Goals of Care:  *Please note that this is a verbal dictation therefore any spelling or grammatical errors are due to the "[place]" system interpretation.  I have reviewed medical records including EPIC notes, labs and imaging, received report from bedside RN, assessed the patient who was lying in bed - noted to be diaphoretic though did not at this point endorse any complaints.   I met with Cory Weber" Carden to further discuss diagnosis prognosis, GOC, EOL wishes, disposition and options.   I introduced Palliative Medicine as specialized medical care for people living with serious illness. It focuses on providing relief from the symptoms and stress of a serious illness. The goal is to improve quality of life for both the patient and the family.  Cory Weber shares with me that he is from [place], [place]. He is not married. He has one daughter. 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 He is  a man of faith and practices within Christianity.  Prior to admission Cory Weber was living at [facility] where he has been a permanent resident for the past four years. He requires help with all bADL's except feeding. He had bilateral BKA's but is able to use a wheelchair.   We reviewed Cory Weber's chronic conditions inclusive of his COPD, dCHF, CAD, and PVD. We reviewed that all of the disease he has presently are going to progress over time. Shared that with chronic progressive disease processes things eventually get to a point where medical treatments are no long effective and this is when we need to transition our thought process to hospice care. He at this time does not express readiness for hospice care or acceptance of present severity of illness.   A detailed discussion was had today regarding advanced directives - we reviewed that Cory Weber is his HCPOA per [place] review.    Concepts specific to code status, artifical feeding and hydration, continued IV antibiotics and rehospitalization was had.  Reviewed that at this time Cory Weber would like to be full code - explained the concern that in the setting of his underlying co-morbidities he would not fair well in a cardiopulmonary resuscitation and I worry it would cause his body great trauma with no benefit. He shares that he has considered this and would still like "everything" done for him. Provided "Hard Choices for [employer]" booklet for review and ongoing conversations.   Goals are to be optimized and go back to living at [facility].   During our  conversations Cory Weber was noted to become more tachypneic, tachycardic, and diaphoretic. He complained of severe sternal chest pain and shortness of breath. Nursing was made aware of this and a RR was called.   Consultation time was decreased as patient required immediate intervention for stabilization.  Decision Maker: Cory Weber [phone]  SUMMARY OF  RECOMMENDATIONS   Full Code / Full Scope of Care - Despite knowledge of burdens vs. 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 He is at an exceptionally high 12 month mortality risk.   Discharge Planning: Discharge back to [facility] when medically optimized with outpatient Palliative Support   Vitals:   07/31/20 0323 07/31/20 0425  BP: (!) 154/85 (!) 157/87  Pulse: 99 95  Resp: 20 20  Temp: 98.4 F (36.9 C) 98.4 F (36.9 C)  SpO2: 100% 99%    Intake/Output Summary (Last 24 hours) at 07/31/2020 0630 Last data filed at 07/31/2020 6440 Gross per 24 hour  Intake 373.91 ml  Output 400 ml  Net -26.09 ml   Last Weight  Most recent update: 07/30/2020  4:08 PM    Weight  116.3 kg (256 lb 6.4 oz)            Gen: Older AA M in severe distress HEENT: Dry mucous membranes CV: Irregular rate and rhythm  PULM: On 4LPM [place], tachypneic ABD: soft/nontender  EXT: Bilatereal BKA's Neuro: Alert and oriented x3  PPS: 30%   This conversation/these recommendations were discussed with patient primary care team, Dr. Avon Gully  Time In: 1650 Time Out:  1740 Total Time: 50 minutes Greater than 50%  of this time was spent counseling and coordinating care related to the above assessment and plan.  [place] Team Team Cell Phone: [phone] Please utilize secure chat with additional questions, if there is no response within 30 minutes please call the above phone number  Palliative Medicine Team providers are available by phone from 7am to 7pm daily and can be reached through the team cell phone.  Should this patient require assistance outside of these hours, please call the patient's attending physician.

## 2020-07-31 NOTE — TOC Initial Note (Signed)
Transition of Care [facility]) - Initial/Assessment Note    Patient Details  Name: Cory Weber MRN: [id number] Date of Birth: January 29, 1951  Transition of Care [facility]) CM/SW Contact:    Trula Ore, [place] Phone Number: 07/31/2020, 2:17 PM  Clinical Narrative:                  CSW spoke with patient at bedside. Patient confirmed he comes from [facility] term. Patient has received both covid vaccines as well as booster. Patient gave CSW permission to discuss his care with his daughter [name]. CSW spoke with Jackelyn Poling at [facility] who confirmed that patient came from there long term. Debbie confirmed patient can return when medically ready. CSW received consult for palliative services to follow patient at SNF. CSW spoke with patient who confirmed CSW can make referral to [facility] for palliative services to follow patient at SNF.. CSW called and spoke with Colletta Maryland with [facility] who accepted referral for palliative services to follow patient at [facility]CSW updated patients daughter [name] on patients dc plan.CSW will continue to follow and assist with discharge planning needs.  Expected Discharge Plan: [place] (from [place] term) Barriers to Discharge: Continued Medical Work up   Patient Goals and CMS Choice Patient states their goals for this hospitalization and ongoing recovery are:: to go to SNF CMS Medicare.gov Compare Post Acute Care list provided to:: Patient Choice offered to / list presented to : Patient  Expected Discharge Plan and Services Expected Discharge Plan: [place] (from [place] term) In-house Referral: Clinical Social Work     Living arrangements for the past 2 months: [place]                                      Prior Living Arrangements/Services Living arrangements for the past 2 months: [place] Lives with:: Facility Resident (from [facility] SNF long term) Patient language and need for interpreter reviewed:: Yes Do you feel safe going back to the place where you live?: Yes      Need for Family Participation in Patient Care: Yes (Comment) Care giver support system in place?: Yes (comment)   Criminal Activity/Legal Involvement Pertinent to Current Situation/Hospitalization: No - Comment as needed  Activities of Daily Living Home Assistive Devices/Equipment: Wheelchair ADL Screening (condition at time of admission) Patient's cognitive ability adequate to safely complete daily activities?: Yes Is the patient deaf or have difficulty hearing?: Yes (sometimes) Does the patient have difficulty seeing, even when wearing glasses/contacts?: No Does the patient have difficulty concentrating, remembering, or making decisions?: Yes (sometimes) Patient able to express need for assistance with ADLs?: Yes Does the patient have difficulty dressing or bathing?: Yes Independently performs ADLs?: No Communication: Independent Dressing (OT): Needs assistance Is this a change from baseline?: Pre-admission baseline Grooming: Independent with device (comment) Feeding: Independent Bathing: Needs assistance Is this a change from baseline?: Pre-admission baseline Toileting: Needs assistance Is this a change from baseline?: Pre-admission baseline In/Out Bed: Needs assistance Is this a change from baseline?: Pre-admission baseline Walks in Home: Independent with device (comment) (pt has BKA and uses wheelchair all the time) Does the patient have difficulty walking or climbing stairs?: Yes (BKA) Weakness of Legs: Both Weakness of Arms/Hands: None  Permission Sought/Granted Permission sought to share information with : Case Manager, Family Supports, Chartered certified accountant granted to  share information with : Yes, Verbal Permission Granted  Share Information with NAME: [name]  Permission granted to share info w AGENCY:  SNF  Permission granted to share info w Relationship: daughter  Permission granted to share info w Contact Information: [name] [phone]  Emotional Assessment Appearance:: Appears stated age Attitude/Demeanor/Rapport: Gracious Affect (typically observed): Calm Orientation: : Oriented to Self, Oriented to Place, Oriented to  Time, Oriented to Situation (WDL) Alcohol / Substance Use: Not Applicable Psych Involvement: No (comment)  Admission diagnosis:  NSTEMI (non-ST elevated myocardial infarction) ([place]) [I21.4] Acute respiratory failure with hypoxia ([place]) [J96.01] Patient Active Problem List   Diagnosis Date Noted   Hypoalbuminemia 07/30/2020   Elevated d-dimer 07/30/2020   Elevated brain natriuretic peptide (BNP) level 07/30/2020   Elevated troponin I level 07/30/2020   Hyperglycemia due to diabetes mellitus ([place]) 07/30/2020   CAD (coronary artery disease) 07/30/2020   Elevated PSA 07/25/2020   Urinary retention 07/25/2020   Chronic respiratory failure with hypoxia (HCC)    Symptomatic anemia 06/23/2020   COPD with acute exacerbation ([place]) 06/10/2020   Acute respiratory failure with hypoxia and hypercarbia ([place]) 06/10/2020   COPD (chronic obstructive pulmonary disease) ([place])    Acute on chronic anemia 06/01/2020   Class 2 obesity due to excess calories with body mass index (BMI) of 35.0 to 35.9 in adult    Acute on chronic systolic CHF (congestive heart failure) ([place]) 04/27/2020   AVM (arteriovenous malformation) of small bowel, acquired    Gastritis and gastroduodenitis    Gastric diverticulum    Grade I internal hemorrhoids    Occult blood in stools    Heart failure ([place]) 03/20/2020   NSTEMI (non-ST elevated myocardial infarction) ([place]) 03/20/2020   Pneumonia due to COVID-19 virus 02/24/2019   Acute on chronic respiratory failure with hypoxia ([place]) 02/20/2019   COVID-19 virus detected 02/20/2019   DNR (do not resuscitate) 02/20/2019   PAD (peripheral artery disease)  ([place]) 08/22/2016   Osteomyelitis of foot ([place]) 07/08/2016   S/P transmetatarsal amputation of foot, right ([place]) 06/26/2016   Constipation due to opioid therapy 06/15/2016   Physical deconditioning 06/10/2016   Chronic diastolic CHF (congestive heart failure) ([place]) 06/10/2016   Ischemic pain of right foot 06/04/2016   Ischemia of foot 05/31/2016   Chronic gout due to renal impairment involving toe of right foot without tophus 02/05/2016   Type 2 diabetes with nephropathy ([place]) 01/21/2016   Uncontrolled type II diabetes with peripheral autonomic neuropathy ([place]) 01/17/2016   Edema of right lower extremity 12/20/2015   Cough 11/26/2015   GERD (gastroesophageal reflux disease) 10/08/2015   Amputation of left lower extremity below knee ([place]) 08/21/2015   Phantom limb pain (HCC)    Anemia of chronic disease    HLD (hyperlipidemia)    BPH (benign prostatic hyperplasia)    ETOH abuse    PVD (peripheral vascular disease) ([place])    Chronic hepatitis C without hepatic coma ([place])    History of CVA (cerebrovascular accident) without residual deficits    Hyponatremia    Chronic kidney disease, stage 3 ([place]) 07/18/2015   Essential hypertension 07/18/2015   Acute blood loss anemia 12/01/2013   Constipation 12/01/2013   S/P lumbar spinal fusion 11/25/2013   Melena 12/03/2011   Hemiparesthesia 02/03/2011   Polysubstance abuse ([place]) 02/03/2011   Tobacco abuse 02/03/2011   PCP:  Caprice Renshaw, MD Pharmacy:  No Pharmacies Listed    Social Determinants of Health (SDOH) Interventions    Readmission Risk Interventions Readmission Risk Prevention  Plan 03/20/2020  Transportation Screening Complete  PCP or Specialist Appt within 3-5 Days Not Complete  Not Complete comments Patient resides at [facility] and the facility schedules hospital follow up appointments.  [place] or Home Care Consult Complete  Social Work Consult for [place] Planning/Counseling Complete  Palliative Care Screening Not  Applicable  Medication Review Press photographer) Complete  Some recent data might be hidden

## 2020-08-01 ENCOUNTER — Inpatient Hospital Stay (HOSPITAL_COMMUNITY): Payer: Medicare (Managed Care)

## 2020-08-01 DIAGNOSIS — I214 Non-ST elevation (NSTEMI) myocardial infarction: Secondary | ICD-10-CM | POA: Diagnosis not present

## 2020-08-01 DIAGNOSIS — D649 Anemia, unspecified: Secondary | ICD-10-CM | POA: Diagnosis not present

## 2020-08-01 DIAGNOSIS — I25119 Atherosclerotic heart disease of native coronary artery with unspecified angina pectoris: Secondary | ICD-10-CM

## 2020-08-01 DIAGNOSIS — R778 Other specified abnormalities of plasma proteins: Secondary | ICD-10-CM

## 2020-08-01 DIAGNOSIS — I10 Essential (primary) hypertension: Secondary | ICD-10-CM | POA: Diagnosis not present

## 2020-08-01 LAB — BASIC METABOLIC PANEL WITH GFR
Anion gap: 3 — ABNORMAL LOW (ref 5–15)
BUN: 22 mg/dL (ref 8–23)
CO2: 38 mmol/L — ABNORMAL HIGH (ref 22–32)
Calcium: 8.7 mg/dL — ABNORMAL LOW (ref 8.9–10.3)
Chloride: 94 mmol/L — ABNORMAL LOW (ref 98–111)
Creatinine, Ser: 1.41 mg/dL — ABNORMAL HIGH (ref 0.61–1.24)
GFR, Estimated: 55 mL/min — ABNORMAL LOW
Glucose, Bld: 237 mg/dL — ABNORMAL HIGH (ref 70–99)
Potassium: 3.6 mmol/L (ref 3.5–5.1)
Sodium: 135 mmol/L (ref 135–145)

## 2020-08-01 LAB — CBC
HCT: 23.3 % — ABNORMAL LOW (ref 39.0–52.0)
Hemoglobin: 7.5 g/dL — ABNORMAL LOW (ref 13.0–17.0)
MCH: 30 pg (ref 26.0–34.0)
MCHC: 32.2 g/dL (ref 30.0–36.0)
MCV: 93.2 fL (ref 80.0–100.0)
Platelets: 214 10*3/uL (ref 150–400)
RBC: 2.5 MIL/uL — ABNORMAL LOW (ref 4.22–5.81)
RDW: 14.2 % (ref 11.5–15.5)
WBC: 4.5 10*3/uL (ref 4.0–10.5)
nRBC: 0.7 % — ABNORMAL HIGH (ref 0.0–0.2)

## 2020-08-01 LAB — ECHOCARDIOGRAM COMPLETE
AR max vel: 1.77 cm2
AV Area VTI: 1.88 cm2
AV Area mean vel: 1.58 cm2
AV Mean grad: 8 mmHg
AV Peak grad: 14.1 mmHg
Ao pk vel: 1.88 m/s
Area-P 1/2: 6.65 cm2
Height: 73 in
S' Lateral: 4.6 cm
Single Plane A4C EF: 47.5 %
Weight: 4102.4 oz

## 2020-08-01 LAB — GLUCOSE, CAPILLARY
Glucose-Capillary: 196 mg/dL — ABNORMAL HIGH (ref 70–99)
Glucose-Capillary: 240 mg/dL — ABNORMAL HIGH (ref 70–99)
Glucose-Capillary: 364 mg/dL — ABNORMAL HIGH (ref 70–99)
Glucose-Capillary: 281 mg/dL — ABNORMAL HIGH (ref 70–99)

## 2020-08-01 LAB — URINE CULTURE: Culture: >=100000 — AB

## 2020-08-01 MED ORDER — SODIUM CHLORIDE 0.9 % IV SOLN
1.0000 g | INTRAVENOUS | Status: DC
  Administered 2020-08-01: 1 g via INTRAVENOUS
  Filled 2020-08-01: qty 10

## 2020-08-01 MED ORDER — SODIUM CHLORIDE 0.9 % IV SOLN
1.0000 g | Freq: Three times a day (TID) | INTRAVENOUS | Status: DC
  Administered 2020-08-01 – 2020-08-02 (×3): 1 g via INTRAVENOUS
  Filled 2020-08-01 (×5): qty 1

## 2020-08-01 MED ORDER — FUROSEMIDE 80 MG PO TABS
80.0000 mg | ORAL_TABLET | Freq: Every day | ORAL | Status: DC
  Administered 2020-08-01 – 2020-08-02 (×2): 80 mg via ORAL
  Filled 2020-08-01 (×2): qty 1

## 2020-08-01 MED ORDER — INSULIN GLARGINE 100 UNIT/ML ~~LOC~~ SOLN
18.0000 [IU] | Freq: Every day | SUBCUTANEOUS | Status: DC
  Administered 2020-08-01: 18 [IU] via SUBCUTANEOUS
  Filled 2020-08-01 (×2): qty 0.18

## 2020-08-01 MED ORDER — PERFLUTREN LIPID MICROSPHERE
1.0000 mL | INTRAVENOUS | Status: AC | PRN
  Administered 2020-08-01: 2 mL via INTRAVENOUS
  Filled 2020-08-01: qty 10

## 2020-08-01 NOTE — Progress Notes (Signed)
Progress Note  Patient Name: Cory Weber Date of Encounter: 08/01/2020  [facility] Cardiologist: Rozann Lesches, MD   Subjective   No acute events overnight. Continues to have intermittent sharp chest pain, which he describes as tenderness. Also feels like his room is warm, though both the sonographer and I note it is normal temperature.  Inpatient Medications    Scheduled Meds:  sodium chloride   Intravenous Once   aspirin EC  81 mg Oral Q breakfast   carvedilol  25 mg Oral BID WC   cloNIDine  0.2 mg Oral BID   clopidogrel  75 mg Oral Daily   feeding supplement (GLUCERNA SHAKE)  237 mL Oral TID BM   furosemide  80 mg Oral Daily   gabapentin  300 mg Oral BID   hydrALAZINE  100 mg Oral Q8H   insulin aspart  0-5 Units Subcutaneous QHS   insulin aspart  0-9 Units Subcutaneous TID WC   insulin glargine  8 Units Subcutaneous QHS   iron polysaccharides  150 mg Oral BID   isosorbide mononitrate  180 mg Oral Daily   melatonin  6 mg Oral QHS   pantoprazole  40 mg Oral BID   polyethylene glycol  17 g Oral Daily   rosuvastatin  20 mg Oral QHS   spironolactone  25 mg Oral Daily   tamsulosin  0.4 mg Oral Daily   vitamin B-12  500 mcg Oral Daily   Continuous Infusions:  cefTRIAXone (ROCEPHIN)  IV 1 g (08/01/20 0906)   PRN Meds: ipratropium-albuterol, morphine injection, nitroGLYCERIN, oxyCODONE-acetaminophen, perflutren lipid microspheres (DEFINITY) IV suspension   Vital Signs    Vitals:   07/31/20 2327 08/01/20 0316 08/01/20 0507 08/01/20 0744  BP:   (!) 146/76 130/75  Pulse: 82 79 85 89  Resp: 17 18 17 17   Temp:   98.2 F (36.8 C) 98 F (36.7 C)  TempSrc:   Axillary Oral  SpO2: 100% 98% 100% 99%  Weight:      Height:        Intake/Output Summary (Last 24 hours) at 08/01/2020 1205 Last data filed at 08/01/2020 0500 Gross per 24 hour  Intake --  Output 700 ml  Net -700 ml   Last 3 Weights 07/30/2020 07/30/2020 07/25/2020  Weight (lbs) 256 lb 6.4 oz 251 lb  5.2 oz (No Data)  Weight (kg) 116.302 kg 114 kg (No Data)      Telemetry    NSR with rare PVCs- Personally Reviewed  ECG    SR, PVC, nonspecific ST changes - Personally Reviewed  Physical Exam   GEN: No acute distress.   Neck: No JVD appreciated Cardiac: RRR, no murmurs, rubs, or gallops.  Respiratory: Clear to auscultation bilaterally. GI: Soft, nontender, non-distended  MS: No edema, bilateral BKA Neuro:  Nonfocal  Psych: Normal affect   Labs    High Sensitivity Troponin:   Recent Labs  Lab 07/07/20 1343 07/07/20 1544 07/30/20 0224 07/30/20 0406 07/30/20 1626  TROPONINIHS 17 19* 812* 1,116* 1,539*      Chemistry Recent Labs  Lab 07/30/20 0224 07/31/20 1032 08/01/20 0210  NA 132* 135 135  K 3.6 3.6 3.6  CL 95* 94* 94*  CO2 34* 37* 38*  GLUCOSE 325* 206* 237*  BUN 21 16 22   CREATININE 1.58* 1.39* 1.41*  CALCIUM 8.4* 8.8* 8.7*  PROT 10.4* 10.0*  --   ALBUMIN 2.7* 2.4*  --   AST 14* 17  --   ALT 12 10  --  ALKPHOS 54 38  --   BILITOT 0.0* 0.4  --   GFRNONAA 48* 56* 55*  ANIONGAP 3* 4* 3*     Hematology Recent Labs  Lab 07/30/20 0224 07/31/20 1032 08/01/20 0210  WBC 4.7 5.3 4.5  RBC 2.37* 2.62* 2.50*  HGB 7.2* 7.8* 7.5*  HCT 23.5* 24.8* 23.3*  MCV 99.2 94.7 93.2  MCH 30.4 29.8 30.0  MCHC 30.6 31.5 32.2  RDW 14.3 14.4 14.2  PLT 220 222 214    BNP Recent Labs  Lab 07/30/20 0224 07/31/20 1032  BNP 313.0* 628.9*     DDimer  Recent Labs  Lab 07/30/20 0224  DDIMER 1.31*     Radiology    NM Pulmonary Perfusion  Result Date: 07/31/2020 CLINICAL DATA:  70 year old male with shortness of breath and hypoxia. Positive D-dimer. EXAM: NUCLEAR MEDICINE PERFUSION LUNG SCAN TECHNIQUE: Perfusion images were obtained in multiple projections after intravenous injection of radiopharmaceutical. Ventilation scans intentionally deferred if perfusion scan and chest x-ray adequate for interpretation during COVID 19 epidemic. RADIOPHARMACEUTICALS:   4.2 mCi Tc-1m MAA IV COMPARISON:  07/30/2020 chest radiograph FINDINGS: No wedge shaped perfusion defects are noted. Small patchy areas of decreased perfusion are similar to chest x-ray abnormalities. IMPRESSION: Pulmonary embolism absent Electronically Signed   By: Margarette Canada M.D.   On: 07/31/2020 14:27   DG Chest Port 1 View  Result Date: 07/30/2020 CLINICAL DATA:  Shortness of breath. EXAM: PORTABLE CHEST 1 VIEW COMPARISON:  Earlier today. FINDINGS: Stable heart size and mediastinal contours. The previous retrocardiac opacity is not as well-defined on the current exam due to portable technique, but not significantly changed. No new or progressive airspace disease. There is bronchial thickening. Possible small effusions with fluid in the minor fissure. No pneumothorax. IMPRESSION: 1. Retrocardiac opacity on exam earlier today is not as well-defined on the currently due to portable technique, but not significantly changed. 2. Bronchial thickening and possible developing small effusions. Electronically Signed   By: Keith Rake M.D.   On: 07/30/2020 19:35    Cardiac Studies   Echo pending this AM  Patient Profile     70 y.o. male with diabetes, stroke, hepatitis C, recurrent GI bleed, severe multivessel CAD not amendable to PCI or CABG who was admitted on 07/29/2020 with acute hypoxic respiratory failure secondary to pulmonary edema in the setting of congestive heart failure.  Cardiology was consulted for non-STEMI.  Assessment & Plan    Acute hypoxic respiratory failure -now maintaining O2 sats on room air   2.  Hypertensive crisis -BP well controlled currently -restarting home oral lasix today -renal function, potassium stable -continue carvedilol 25 mg BID, clonidine 0.2 mg BID, furosemide 80 mg daily, hydralazine 100 mg TID, isosorbide 180 mg daily, spironolactone 25 mg daily   3.  Non-STEMI/severe three-vessel CAD not amendable to PCI or CABG/ischemic cardiomyopathy -please review  note from Dr. Audie Box 07/31/20. Unfortunately not a candidate for intervention given recurrent GI bleeds. Recommended for palliative care -continue aspirin and clopidogrel for now, if Hgb drops will need to reassess -antianginals as above -echo pending this AM   4.  Recurrent anemia/GI blood loss -Transfuse to maintain hemoglobin over 8.  This prevents the ability to intervene on his CAD  Very difficult situation, multiple medical comorbid, complex medical decision making.  [facility] will sign off.   Medication Recommendations:   Continue aspirin, clopidogrel unless Hgb drops, then would stop Continue carvedilol 25 mg BID Continue clonidine 0.2 mg BID (increase from home  dose) Continue furosemide 80 mg daily Continue hydralazine 100 mg TID (increase from home dose) Continue isosorbide 180 mg daily (increase from home dose) Continue rosuvastatin 20 mg daily Continue spironolactone 25 mg daily (new this admission) Other recommendations (labs, testing, etc):  bmet, cbc as outpatient Follow up as an outpatient:  We will arrange for outpatient cardiology follow up  For questions or updates, please contact [place] Please consult [email or web address] for contact info under        Signed, Buford Dresser, MD  08/01/2020, 12:05 PM

## 2020-08-01 NOTE — Progress Notes (Signed)
Heart Failure Navigator Progress Note  Assessed for Heart & Vascular TOC clinic readiness.  At this time the patient does not meet criteria due to ongoing incremental palliative care support.   Navigator available for reassessment of patient.   Kerby Nora, PharmD, BCPS Heart Failure Stewardship Pharmacist Phone [phone]

## 2020-08-01 NOTE — Progress Notes (Signed)
PROGRESS NOTE    Cory Weber  JOI:[id number] DOB: [birth year] DOA: 07/29/2020 PCP: Caprice Renshaw, MD   Brief Narrative:  Cory Weber is a 70 y.o. male with medical history significant for  chronic blood loss anemia, asthma, COPD, BPH, CAD, chronic diastolic heart failure, history of CVA, hypertension, GERD, hyperlipidemia, osteomyelitis, PAD with bilateral BKA, type II DM with diabetic peripheral neuropathy, chronic respiratory failure on 3 LPM of oxygen via [place] at baseline who presents to the emergency department via EMS due to shortness of breath last night (6/18).  Patient states that he became short of breath just prior to going to bed at the living facility last night, EMS was activated and on arrival of EMS team, patient was noted to be hypoxic with an O2 sat of 86%, he was also diaphoretic, he was then placed on NRB with improved saturation to 100%.  He denies chest pain, nausea, vomiting fever or chills.    Assessment & Plan:   Active Problems:   Acute blood loss anemia   Constipation   Chronic kidney disease, stage 3 (HCC)   Essential hypertension   Hyponatremia   HLD (hyperlipidemia)   BPH (benign prostatic hyperplasia)   GERD (gastroesophageal reflux disease)   PAD (peripheral artery disease) (HCC)   Acute on chronic respiratory failure with hypoxia (HCC)   NSTEMI (non-ST elevated myocardial infarction) (HCC)   Acute on chronic systolic CHF (congestive heart failure) (HCC)   Acute on chronic anemia   COPD (chronic obstructive pulmonary disease) (HCC)   Symptomatic anemia   Hypoalbuminemia   Elevated d-dimer   Elevated brain natriuretic peptide (BNP) level   Elevated troponin I level   Hyperglycemia due to diabetes mellitus (HCC)   CAD (coronary artery disease)   Sepsis secondary to ESBL Ecoli UTI - POA, resolving -Might explain some of patient's more acute reason for stress response and HTN emergency given no other medication changes or issues noted -Initiated  on ceftriaxone today, ESBL per micro transition to meropenem x3 days -Blood cultures reassuringly negative  Acute on chronic respiratory failure with hypoxia due to acute on chronic systolic CHF Concurrent Hypertensive emergency with subsequent flash pulmonary edema -Rapid response late 19th transitioned temporarily to Bipap - after nitro gtt and HTN control this was removed and placed back on  3L-[place] -patient's baseline -Cardiology following, appreciate insight and recommendations -Continue Coreg 25, clonidine 0.2 twice daily, hydralazine 100mg  3 times daily, isosorbide 180, spironolactone 25 -Resume home furosemide  NSTEMI, Associated chest pain secondary to hypertensive emergency -Troponin elevated in the setting of hypertensive emergency, flash pulmonary edema, hypoxia with known obstructive CAD -continue aspirin Plavix -As above cardiology following - not a candidate for intervention at this time, will continue aggressive medical management per discussion with cardiology -Patient had catheterization in February of this year where he had 70% distal left main stenosis and ostial LAD stenosis, 40% proximal RCA stenosis and 99% circumflex stenosis. PCI was not done at that time due to patient being considered as a candidate. Continue to trend troponin   Acute symptomatic anemia, resolved Acute blood loss anemia ruled out Chronic anemia of chronic disease/malnutrition ongoing - EGD and video capsule has been done previously without obvious source of patient's blood loss -Status post 1 unit of PRBC -Hemoglobin currently stable  Hyperglycemia secondary to poorly controlled T2DM Continue ISS and hypoglycemic protocol Increase Lantus to 18 units (home regimen )   Elevated D-dimer D-dimer 1.31 CT angiography of the chest was not done due  to patient's kidney status VQ scan negative   Questionable bronchopneumonia ruled out Chest x-ray shows questionable patchy consolidation, procalcitonin  reassuringly negative, no fevers chills or white count at this point  Hypoalbuminemia secondary to moderate protein calorie malnutrition Protein supplement will be provided  CKD stage 3A -Currently at baseline, continue to follow   Hyperlipidemia Continue Crestor  BPH Continue Flomax   GERD Continue Protonix   COPD not in acute exacerbation Continue DuoNeb Continue supplemental oxygen per home regimen    DVT prophylaxis: None currently, heparin drip discontinued; no SCDs given bilateral amputation Code Status: DNR Family Communication: None present  Status is: Inpatient  Dispo: The patient is from: [facility]              Anticipated d/c is to: Same              Anticipated d/c date is: 48 to 72 hours              Patient currently not medically stable for discharge  Consultants:  Cardiology, palliative care  Procedures:  None  Antimicrobials:  None indicated  Subjective: No acute issues or events overnight, chest pain currently resolved denies further episodes of nausea vomiting diarrhea constipation headache fevers chills chest pain shortness of breath or fevers.  Patient currently tolerating echo, tolerating p.o. without any issues, no dark stool, bright red blood per rectum or hematemesis.  Objective: Vitals:   07/31/20 2327 08/01/20 0316 08/01/20 0507 08/01/20 0744  BP:   (!) 146/76 130/75  Pulse: 82 79 85 89  Resp: 17 18 17 17   Temp:   98.2 F (36.8 C) 98 F (36.7 C)  TempSrc:   Axillary Oral  SpO2: 100% 98% 100% 99%  Weight:      Height:        Intake/Output Summary (Last 24 hours) at 08/01/2020 0810 Last data filed at 08/01/2020 0500 Gross per 24 hour  Intake --  Output 700 ml  Net -700 ml    Filed Weights   07/30/20 0007 07/30/20 1600  Weight: 114 kg 116.3 kg    Examination:  General:  Pleasantly resting in bed, No acute distress. HEENT:  Normocephalic atraumatic.  Sclerae nonicteric, noninjected.  Extraocular movements intact  bilaterally. Neck:  Without mass or deformity.  Trachea is midline. Lungs:  Clear to auscultate bilaterally without rhonchi, wheeze, or rales. Heart:  Regular rate and rhythm.  Without murmurs, rubs, or gallops. Abdomen:  Soft, nontender, nondistended.  Without guarding or rebound. Extremities: Bilateral BKA. Vascular:  Dorsalis pedis and posterior tibial pulses palpable bilaterally. Skin:  Warm and dry, no erythema, no ulcerations.  Data Reviewed: I have personally reviewed following labs and imaging studies  CBC: Recent Labs  Lab 07/30/20 0224 07/31/20 1032 08/01/20 0210  WBC 4.7 5.3 4.5  NEUTROABS 3.2  --   --   HGB 7.2* 7.8* 7.5*  HCT 23.5* 24.8* 23.3*  MCV 99.2 94.7 93.2  PLT 220 222 694    Basic Metabolic Panel: Recent Labs  Lab 07/30/20 0224 07/30/20 0406 07/31/20 1032 08/01/20 0210  NA 132*  --  135 135  K 3.6  --  3.6 3.6  CL 95*  --  94* 94*  CO2 34*  --  37* 38*  GLUCOSE 325*  --  206* 237*  BUN 21  --  16 22  CREATININE 1.58*  --  1.39* 1.41*  CALCIUM 8.4*  --  8.8* 8.7*  MG  --  1.9  --   --  PHOS  --  4.5  --   --     GFR: Estimated Creatinine Clearance: 68 mL/min (A) (by C-G formula based on SCr of 1.41 mg/dL (H)). Liver Function Tests: Recent Labs  Lab 07/30/20 0224 07/31/20 1032  AST 14* 17  ALT 12 10  ALKPHOS 54 38  BILITOT 0.0* 0.4  PROT 10.4* 10.0*  ALBUMIN 2.7* 2.4*    Recent Labs  Lab 07/30/20 0224  LIPASE 20    No results for input(s): AMMONIA in the last 168 hours. Coagulation Profile: Recent Labs  Lab 07/31/20 1032  INR 1.2   Cardiac Enzymes: No results for input(s): CKTOTAL, CKMB, CKMBINDEX, TROPONINI in the last 168 hours. BNP (last 3 results) No results for input(s): PROBNP in the last 8760 hours. HbA1C: No results for input(s): HGBA1C in the last 72 hours. CBG: Recent Labs  Lab 07/30/20 2113 07/31/20 0749 07/31/20 1257 07/31/20 1613 07/31/20 2145  GLUCAP 167* 205* 204* 348* 299*    Lipid  Profile: No results for input(s): CHOL, HDL, LDLCALC, TRIG, CHOLHDL, LDLDIRECT in the last 72 hours. Thyroid Function Tests: No results for input(s): TSH, T4TOTAL, FREET4, T3FREE, THYROIDAB in the last 72 hours. Anemia Panel: No results for input(s): VITAMINB12, FOLATE, FERRITIN, TIBC, IRON, RETICCTPCT in the last 72 hours. Sepsis Labs: Recent Labs  Lab 07/30/20 0224 07/30/20 0406 07/30/20 0407  PROCALCITON  --  <0.10  --   LATICACIDVEN 1.2  --  0.9     Recent Results (from the past 240 hour(s))  Urine Culture     Status: Abnormal (Preliminary result)   Collection Time: 07/30/20  1:15 AM   Specimen: Urine, Clean Catch  Result Value Ref Range Status   Specimen Description   Final    URINE, CLEAN CATCH Performed at [facility], [address] [place], [place] [address]    Special Requests   Final    NONE Performed at [facility], [address] [place], [place] [address]    Culture (A)  Final    >=100,000 COLONIES/mL ESCHERICHIA COLI SUSCEPTIBILITIES TO FOLLOW Performed at [facility] [address], [place], [place] [address]    Report Status PENDING  Incomplete  Blood culture (routine x 2)     Status: None (Preliminary result)   Collection Time: 07/30/20  2:24 AM   Specimen: BLOOD RIGHT HAND  Result Value Ref Range Status   Specimen Description BLOOD RIGHT HAND  Final   Special Requests   Final    BOTTLES DRAWN AEROBIC AND ANAEROBIC Blood Culture adequate volume   Culture   Final    NO GROWTH 2 DAYS Performed at [facility], [address] [place], [place] [address]    Report Status PENDING  Incomplete  Blood culture (routine x 2)     Status: None (Preliminary result)   Collection Time: 07/30/20  2:35 AM   Specimen: BLOOD RIGHT ARM  Result Value Ref Range Status   Specimen Description BLOOD RIGHT ARM  Final   Special Requests   Final    BOTTLES DRAWN AEROBIC AND ANAEROBIC Blood Culture adequate volume   Culture   Final    NO GROWTH 2 DAYS Performed  at [facility], [address], [place], [place] [address]    Report Status PENDING  Incomplete  SARS CORONAVIRUS 2 (TAT 6-24 HRS) Nasopharyngeal Nasopharyngeal Swab     Status: None   Collection Time: 07/30/20  3:39 AM   Specimen: Nasopharyngeal Swab  Result Value Ref Range Status  SARS Coronavirus 2 NEGATIVE NEGATIVE Final    Comment: (NOTE) SARS-CoV-2 target nucleic acids are NOT DETECTED.  The SARS-CoV-2 RNA is generally detectable in upper and lower respiratory specimens during the acute phase of infection. Negative results do not preclude SARS-CoV-2 infection, do not rule out co-infections with other pathogens, and should not be used as the sole basis for treatment or other patient management decisions. Negative results must be combined with clinical observations, patient history, and epidemiological information. The expected result is Negative.  Fact Sheet for Patients: SugarRoll.be  Fact Sheet for Healthcare Providers: https://www.woods-mathews.com/  This test is not yet approved or cleared by the [place] FDA and  has been authorized for detection and/or diagnosis of SARS-CoV-2 by FDA under an Emergency Use Authorization (EUA). This EUA will remain  in effect (meaning this test can be used) for the duration of the COVID-19 declaration under Se ction 564(b)(1) of the Act, 21 U.S.C. section 360bbb-3(b)(1), unless the authorization is terminated or revoked sooner.  Performed at [facility], [place] [address] [place],  [address]           Radiology Studies: NM Pulmonary Perfusion  Result Date: 07/31/2020 CLINICAL DATA:  71 year old male with shortness of breath and hypoxia. Positive D-dimer. EXAM: NUCLEAR MEDICINE PERFUSION LUNG SCAN TECHNIQUE: Perfusion images were obtained in multiple projections after intravenous injection of radiopharmaceutical. Ventilation scans intentionally deferred if perfusion  scan and chest x-ray adequate for interpretation during COVID 19 epidemic. RADIOPHARMACEUTICALS:  4.2 mCi Tc-64m MAA IV COMPARISON:  07/30/2020 chest radiograph FINDINGS: No wedge shaped perfusion defects are noted. Small patchy areas of decreased perfusion are similar to chest x-ray abnormalities. IMPRESSION: Pulmonary embolism absent Electronically Signed   By: Margarette Canada M.D.   On: 07/31/2020 14:27   DG Chest Port 1 View  Result Date: 07/30/2020 CLINICAL DATA:  Shortness of breath. EXAM: PORTABLE CHEST 1 VIEW COMPARISON:  Earlier today. FINDINGS: Stable heart size and mediastinal contours. The previous retrocardiac opacity is not as well-defined on the current exam due to portable technique, but not significantly changed. No new or progressive airspace disease. There is bronchial thickening. Possible small effusions with fluid in the minor fissure. No pneumothorax. IMPRESSION: 1. Retrocardiac opacity on exam earlier today is not as well-defined on the currently due to portable technique, but not significantly changed. 2. Bronchial thickening and possible developing small effusions. Electronically Signed   By: Keith Rake M.D.   On: 07/30/2020 19:35        Scheduled Meds:  sodium chloride   Intravenous Once   aspirin EC  81 mg Oral Q breakfast   carvedilol  25 mg Oral BID WC   cloNIDine  0.2 mg Oral BID   clopidogrel  75 mg Oral Daily   feeding supplement (GLUCERNA SHAKE)  237 mL Oral TID BM   gabapentin  300 mg Oral BID   hydrALAZINE  100 mg Oral Q8H   insulin aspart  0-5 Units Subcutaneous QHS   insulin aspart  0-9 Units Subcutaneous TID WC   insulin glargine  8 Units Subcutaneous QHS   iron polysaccharides  150 mg Oral BID   isosorbide mononitrate  180 mg Oral Daily   melatonin  6 mg Oral QHS   pantoprazole  40 mg Oral BID   polyethylene glycol  17 g Oral Daily   rosuvastatin  20 mg Oral QHS   spironolactone  25 mg Oral Daily   tamsulosin  0.4 mg Oral Daily   vitamin  B-12   500 mcg Oral Daily   Continuous Infusions:     LOS: 2 days   Time spent: 36min  Jamilee Lafosse C Yang Rack, DO [facility]  If 7PM-7AM, please contact night-coverage [email or web address]  08/01/2020, 8:10 AM

## 2020-08-01 NOTE — Progress Notes (Signed)
  Echocardiogram 2D Echocardiogram with contrast has been performed.  Merrie Roof F 08/01/2020, 11:34 AM

## 2020-08-01 NOTE — Progress Notes (Signed)
Inpatient Diabetes Program Recommendations  AACE/ADA: New Consensus Statement on Inpatient Glycemic Control (2015)  Target Ranges:  Prepandial:   less than 140 mg/dL      Peak postprandial:   less than 180 mg/dL (1-2 hours)      Critically ill patients:  140 - 180 mg/dL   Lab Results  Component Value Date   GLUCAP 196 (H) 08/01/2020   HGBA1C 7.4 (H) 06/10/2020    Review of Glycemic Control Results for Cory Weber, Cory Weber (MRN [id number]) as of 08/01/2020 10:58  Ref. Range 07/31/2020 07:49 07/31/2020 12:57 07/31/2020 16:13 07/31/2020 21:45 08/01/2020 08:23  Glucose-Capillary Latest Ref Range: 70 - 99 mg/dL 205 (H) 204 (H) 348 (H) 299 (H) 196 (H)   Diabetes history: DM2 Outpatient Diabetes medications: Lantus 18 units QD, Novolog 8 units TID, Novolog 2-10 units TID Current orders for Inpatient glycemic control: Lantus 8 units QD, Novolog 0-9 units TID and 0-5 QHS  Inpatient Diabetes Program Recommendations:    Lantus 10 units QHS Novolog 4 units TID with meals if consumes at least 50%  Will continue to follow while inpatient.  Thank you, Reche Dixon, RN, BSN Diabetes Coordinator Inpatient Diabetes Program [phone] (team pager from 8a-5p)

## 2020-08-02 LAB — CBC
HCT: 22.6 % — ABNORMAL LOW (ref 39.0–52.0)
Hemoglobin: 7.2 g/dL — ABNORMAL LOW (ref 13.0–17.0)
MCH: 30.5 pg (ref 26.0–34.0)
MCHC: 31.9 g/dL (ref 30.0–36.0)
MCV: 95.8 fL (ref 80.0–100.0)
Platelets: 195 10*3/uL (ref 150–400)
RBC: 2.36 MIL/uL — ABNORMAL LOW (ref 4.22–5.81)
RDW: 14.3 % (ref 11.5–15.5)
WBC: 4.2 10*3/uL (ref 4.0–10.5)
nRBC: 0 % (ref 0.0–0.2)

## 2020-08-02 LAB — BASIC METABOLIC PANEL
Anion gap: 6 (ref 5–15)
BUN: 27 mg/dL — ABNORMAL HIGH (ref 8–23)
CO2: 35 mmol/L — ABNORMAL HIGH (ref 22–32)
Calcium: 8.7 mg/dL — ABNORMAL LOW (ref 8.9–10.3)
Chloride: 92 mmol/L — ABNORMAL LOW (ref 98–111)
Creatinine, Ser: 1.57 mg/dL — ABNORMAL HIGH (ref 0.61–1.24)
GFR, Estimated: 48 mL/min — ABNORMAL LOW (ref >60)
Glucose, Bld: 260 mg/dL — ABNORMAL HIGH (ref 70–99)
Potassium: 3.9 mmol/L (ref 3.5–5.1)
Sodium: 133 mmol/L — ABNORMAL LOW (ref 135–145)

## 2020-08-02 LAB — GLUCOSE, CAPILLARY
Glucose-Capillary: 234 mg/dL — ABNORMAL HIGH (ref 70–99)
Glucose-Capillary: 298 mg/dL — ABNORMAL HIGH (ref 70–99)
Glucose-Capillary: 335 mg/dL — ABNORMAL HIGH (ref 70–99)

## 2020-08-02 LAB — MRSA NEXT GEN BY PCR, NASAL: MRSA by PCR Next Gen: NOT DETECTED

## 2020-08-02 LAB — PREPARE RBC (CROSSMATCH)

## 2020-08-02 MED ORDER — SODIUM CHLORIDE 0.9% IV SOLUTION: Freq: Once | INTRAVENOUS | Status: DC

## 2020-08-02 MED ORDER — HYDRALAZINE HCL 100 MG PO TABS: 100.0000 mg | ORAL_TABLET | Freq: Three times a day (TID) | ORAL | Status: AC

## 2020-08-02 MED ORDER — SPIRONOLACTONE 25 MG PO TABS: 25.0000 mg | ORAL_TABLET | Freq: Every day | ORAL | Status: AC

## 2020-08-02 MED ORDER — CLONIDINE HCL 0.2 MG PO TABS: 0.2000 mg | ORAL_TABLET | Freq: Two times a day (BID) | ORAL | Status: AC

## 2020-08-02 MED ORDER — ISOSORBIDE MONONITRATE ER 60 MG PO TB24: 180.0000 mg | ORAL_TABLET | Freq: Every day | ORAL | Status: AC

## 2020-08-02 MED ORDER — FOSFOMYCIN TROMETHAMINE 3 G PO PACK
3.0000 g | PACK | Freq: Once | ORAL | Status: AC
  Administered 2020-08-02: 3 g via ORAL
  Filled 2020-08-02: qty 3

## 2020-08-02 NOTE — Discharge Summary (Signed)
Physician Discharge Summary  Cory Weber CBS:[id number] DOB: [birth year] DOA: 07/29/2020  PCP: Caprice Renshaw, MD  Admit date: 07/29/2020 Discharge date: 08/02/2020  Admitted From: [facility] Disposition: [facility] with palliative care following patient  Recommendations for Outpatient Follow-up:  Follow up with PCP in 1-2 weeks Recheck CBC in the next week. Monitor HR, BP. Note changes to antihypertensive regimen as recommended by cardiology below.  Home Health: NA Equipment/Devices: None new Discharge Condition: Stable CODE STATUS: DNR Diet recommendation: Heart healthy, carb-modified  Brief/Interim Summary: Cory Weber is a 70 y.o. male with a history of chronic blood loss anemia, asthma, COPD, BPH, CAD, chronic diastolic heart failure, history of CVA, hypertension, GERD, hyperlipidemia, osteomyelitis, PAD with bilateral BKA, type II DM with diabetic peripheral neuropathy, chronic respiratory failure on 3 LPM of oxygen via [place] at baseline since recovering from covid pneumonia who presented to the emergency department via EMS due to shortness of breath last night on 6/18.  Patient states that he became short of breath just prior to going to bed at the living facility last night. On EMS arrival, O2 saturation was 86%.  He was placed on nonrebreather, this was eventually down titrated to nasal cannula.  Initial troponin was elevated at 812, subsequent troponin went up to 1116.  Procalcitonin negative.  EKG showed sinus tachycardia with ST changes.  Breathing improved with IV Lasix.  Hemoglobin down to 7.3.  D-dimer elevated at 1.31 with subsequent negative perfusion scan. He was transfused 1 u PRBCs and transferred to [facility] for NSTEMI, HTN urgency, and IV diuresis for acute CHF. BP medications were titrated per cardiology, though there are no revascularization options available. Chest pain and dyspnea resolved. Palliative care has been consulted and palliative should continue following at his  long term facility.   Discharge Diagnoses:  Active Problems:   Acute blood loss anemia   Constipation   Chronic kidney disease, stage 3 (HCC)   Essential hypertension   Hyponatremia   HLD (hyperlipidemia)   BPH (benign prostatic hyperplasia)   GERD (gastroesophageal reflux disease)   PAD (peripheral artery disease) (HCC)   Acute on chronic respiratory failure with hypoxia (HCC)   NSTEMI (non-ST elevated myocardial infarction) (HCC)   Acute on chronic systolic CHF (congestive heart failure) (HCC)   Acute on chronic anemia   COPD (chronic obstructive pulmonary disease) (HCC)   Symptomatic anemia   Hypoalbuminemia   Elevated d-dimer   Elevated brain natriuretic peptide (BNP) level   Elevated troponin I level   Hyperglycemia due to diabetes mellitus (HCC)   CAD (coronary artery disease)  Acute on chronic hypoxic respiratory failure due to acute on chronic HFrEF precipitated by HTN emergency: Weaned back down to home oxygen requirement with diuresis. PCT negative, pneumonia ruled out.   Severe CAD with NSTEMI: No revascularization options available per cardiology, they recommend palliative care moving forward.  Continue aspirin, clopidogrel unless Hgb drops, then would stop  HTN with hypertensive emergency: Continue carvedilol 25 mg BID Continue clonidine 0.2 mg BID (increase from home dose) Continue furosemide 80 mg daily Continue hydralazine 100 mg TID (increase from home dose) Continue isosorbide 180 mg daily (increase from home dose) Continue rosuvastatin 20 mg daily Continue spironolactone 25 mg daily (new this admission)  Chronic blood loss anemia due to GI bleeding: Quiescent at this time, with hgb stable at discharge, 7.2. Asymptomatic currently.  - Note history of transfusions (4u) and work up including endoscopy on 03/31/2020 which noted some erosive gastropathy and erythematous mucosa  in the fundus. VCE 04/05/2020 revealed esophageal plaques suspicious for candidiasis,  small AVMs. Enteroscopy on 04/07/2020 and had argon coagulation for all 3 AVMs. Denies current bleeding. - Will repeat 1u PRBCs on 6/22 to maintain hgb >8 per cardiology recommendations. Recheck CBC at follow up. - Continue iron supplement  ESBL E. coli: Sepsis ruled out. No fever, leukocytosis. Mild symptoms.  - Give fosfomycin x1 since the patient has no evidence of systemic disease.   Chronic pain, peripheral neuropathy: Continue home medications  PAD s/p BKA x2  Hyperglycemia secondary to poorly controlled T2DM - Continue home regimen   Elevated D-dimer: D-dimer 1.31. CT angiography of the chest was not done due to patient's kidney status. VQ scan negative  Hypoalbuminemia secondary to moderate protein calorie malnutrition Protein supplement will be provided  CKD stage 3A - Currently at baseline, continue to follow   Hyperlipidemia - Continue Crestor  BPH - Continue Flomax   GERD - Continue Protonix   COPD not in acute exacerbation - Continue home regimen  Discharge Instructions  Allergies as of 08/02/2020   No Known Allergies      Medication List     TAKE these medications    acetaminophen 500 MG tablet Commonly known as: TYLENOL Take 1,000 mg by mouth every 6 (six) hours as needed for headache (pain).   aspirin EC 81 MG tablet Take 1 tablet (81 mg total) by mouth daily with breakfast.   baclofen 10 MG tablet Commonly known as: LIORESAL Take 1 tablet (10 mg total) by mouth every 8 (eight) hours as needed for muscle spasms. For shoulder spasm What changed:  when to take this additional instructions   bisacodyl 10 MG suppository Commonly known as: DULCOLAX Place 10 mg rectally daily as needed (constipation).   carvedilol 25 MG tablet Commonly known as: COREG Take 1 tablet (25 mg total) by mouth 2 (two) times daily with a meal.   cloNIDine 0.2 MG tablet Commonly known as: CATAPRES Take 1 tablet (0.2 mg total) by mouth 2 (two) times daily. What  changed:  medication strength how much to take when to take this   clopidogrel 75 MG tablet Commonly known as: PLAVIX Take 75 mg by mouth daily.   DULoxetine 60 MG capsule Commonly known as: CYMBALTA Take 60 mg by mouth daily.   ferrous gluconate 324 MG tablet Commonly known as: FERGON Take 324 mg by mouth daily.   furosemide 80 MG tablet Commonly known as: LASIX Take 80 mg by mouth daily.   gabapentin 300 MG capsule Commonly known as: NEURONTIN Take 300 mg by mouth 3 (three) times daily.   hydrALAZINE 100 MG tablet Commonly known as: APRESOLINE Take 1 tablet (100 mg total) by mouth 3 (three) times daily. What changed:  medication strength how much to take when to take this   insulin glargine 100 UNIT/ML injection Commonly known as: LANTUS Inject 0.18 mLs (18 Units total) into the skin at bedtime.   ipratropium-albuterol 0.5-2.5 (3) MG/3ML Soln Commonly known as: DUONEB Take 3 mLs by nebulization every 6 (six) hours as needed (SOB/ wheezing).   iron polysaccharides 150 MG capsule Commonly known as: NIFEREX Take 1 capsule (150 mg total) by mouth 2 (two) times daily. What changed: when to take this   isosorbide mononitrate 60 MG 24 hr tablet Commonly known as: IMDUR Take 3 tablets (180 mg total) by mouth daily. What changed:  medication strength how much to take   Lidocaine 4 % Ptch Place 2 patches onto the  skin See admin instructions. Apply 1 patch transdermally one time daily to each knee for pain - remove after 12 hours   loratadine 10 MG tablet Commonly known as: CLARITIN Take 10 mg by mouth daily.   melatonin 5 MG Tabs Take 5 mg by mouth at bedtime.   NovoLOG FlexPen 100 UNIT/ML FlexPen Generic drug: insulin aspart Inject 8 Units into the skin See admin instructions. Inject 8 units subcutaneously three times daily with meals - also inject 2-10 units subcutaneously before meals and at bedtime per sliding scale: CBG 200-250 2 units, 251-300 4 units,  301-350 6 units, 351-400 8 units, >400 10 units and notify MD   omeprazole 20 MG capsule Commonly known as: PRILOSEC Take 20 mg by mouth 2 (two) times daily.   pantoprazole 40 MG tablet Commonly known as: PROTONIX Take 1 tablet (40 mg total) by mouth 2 (two) times daily.   polyethylene glycol 17 g packet Commonly known as: MiraLax Take 17 g by mouth daily. What changed:  when to take this additional instructions   rosuvastatin 20 MG tablet Commonly known as: CRESTOR Take 1 tablet (20 mg total) by mouth daily.   senna 8.6 MG tablet Commonly known as: SENOKOT Take 2 tablets by mouth 2 (two) times daily.   sodium phosphate 7-19 GM/118ML Enem Place 1 enema rectally every 8 (eight) hours as needed (no bowel movement within 12 hours of giving dulcolax suppository).   spironolactone 25 MG tablet Commonly known as: ALDACTONE Take 1 tablet (25 mg total) by mouth daily. Start taking on: August 03, 2020   tamsulosin 0.4 MG Caps capsule Commonly known as: FLOMAX Take 1 capsule (0.4 mg total) by mouth 2 (two) times daily.   vitamin B-12 500 MCG tablet Commonly known as: CYANOCOBALAMIN Take 500 mcg by mouth daily.        Follow-up Information     Satira Sark, MD Follow up on 09/07/2020.   Specialty: Cardiology Why: at 2:30 PM with his Nurse Practitioner Cecilie Kicks, NP Contact information: [place] [address] [phone]         Caprice Renshaw, MD Follow up.   Specialty: Internal Medicine Contact information: [place] [address] [phone]         Satira Sark, MD .   Specialty: Cardiology Contact information: [place] [address] [phone]                No Known Allergies  Consultations: Cardiology Palliative care  Procedures/Studies: CT Head Wo Contrast  Result Date: 07/07/2020 CLINICAL DATA:  Mental status change EXAM: CT HEAD WITHOUT CONTRAST TECHNIQUE: Contiguous axial  images were obtained from the base of the skull through the vertex without intravenous contrast. COMPARISON:  CT head 11/23/2013 FINDINGS: Brain: No evidence of acute infarction, hemorrhage, hydrocephalus, extra-axial collection or mass lesion/mass effect. Vascular: No hyperdense vessel or unexpected calcification. Skull: Normal. Negative for fracture or focal lesion. Sinuses/Orbits: Complete opacification of the left middle ear cavity and mastoid air cells. Near complete opacification of the sphenoid sinuses. Partial opacification of the maxillary sinuses. Partial opacification of the left frontal and ethmoid air cells. Deformity of the medial wall of the left orbit consistent with remote fracture. Other: None. IMPRESSION: 1. No acute intracranial abnormality. 2. Complete opacification of the left mastoid air cells and middle ear cavity. 3. Multi sinus opacification. Electronically Signed   By: Miachel Roux M.D.   On: 07/07/2020 14:55   NM Pulmonary  Perfusion  Result Date: 07/31/2020 CLINICAL DATA:  71 year old male with shortness of breath and hypoxia. Positive D-dimer. EXAM: NUCLEAR MEDICINE PERFUSION LUNG SCAN TECHNIQUE: Perfusion images were obtained in multiple projections after intravenous injection of radiopharmaceutical. Ventilation scans intentionally deferred if perfusion scan and chest x-ray adequate for interpretation during COVID 19 epidemic. RADIOPHARMACEUTICALS:  4.2 mCi Tc-59m MAA IV COMPARISON:  07/30/2020 chest radiograph FINDINGS: No wedge shaped perfusion defects are noted. Small patchy areas of decreased perfusion are similar to chest x-ray abnormalities. IMPRESSION: Pulmonary embolism absent Electronically Signed   By: Margarette Canada M.D.   On: 07/31/2020 14:27   US Abdomen Complete  Result Date: 07/21/2020 CLINICAL DATA:  Intermittent abdominal pain and right flank pain. EXAM: ABDOMEN ULTRASOUND COMPLETE COMPARISON:  None. FINDINGS: Gallbladder: The gallbladder measures 3 mm which is  borderline. The gallbladder is otherwise normal in appearance. No Murphy's sign. Common bile duct: Diameter: 4 mm Liver: Increased echogenicity with no focal mass. Portal vein is patent on color Doppler imaging with normal direction of blood flow towards the liver. IVC: No abnormality visualized. Pancreas: Visualized portion unremarkable. Spleen: Not well visualized due to shadowing bowel gas. Right Kidney: Length: 11.2 cm. Echogenicity within normal limits. No mass or hydronephrosis visualized. Left Kidney: Length: 11.1 cm. Echogenicity within normal limits. No mass or hydronephrosis visualized. Abdominal aorta: The proximal abdominal aorta was measured at 3.8 cm in AP diameter. I believe this measurement is likely artifactual as the AP diameter on the CT scan from October 21, 2019 was only 2.6 cm. Other findings: None. IMPRESSION: 1. The gallbladder wall is borderline in thickness. The gallbladder is otherwise normal in appearance. 2. Increased echogenicity in the liver is nonspecific but often due to hepatic steatosis. 3. The pancreas was not visualized due to shadowing bowel gas. 4. The proximal aorta measured 3.8 cm in AP dimension. I suspect this is artifactual given the provided image and the fact that the proximal abdominal aorta measured 2.6 cm in maximum dimension on the CT scan from October 21, 2019. 5. No other abnormalities. Electronically Signed   By: Dorise Bullion III M.D   On: 07/21/2020 13:56   DG Chest Port 1 View  Result Date: 07/30/2020 CLINICAL DATA:  Shortness of breath. EXAM: PORTABLE CHEST 1 VIEW COMPARISON:  Earlier today. FINDINGS: Stable heart size and mediastinal contours. The previous retrocardiac opacity is not as well-defined on the current exam due to portable technique, but not significantly changed. No new or progressive airspace disease. There is bronchial thickening. Possible small effusions with fluid in the minor fissure. No pneumothorax. IMPRESSION: 1. Retrocardiac  opacity on exam earlier today is not as well-defined on the currently due to portable technique, but not significantly changed. 2. Bronchial thickening and possible developing small effusions. Electronically Signed   By: Keith Rake M.D.   On: 07/30/2020 19:35   DG Chest Portable 1 View  Result Date: 07/30/2020 CLINICAL DATA:  Short of breath for 1 day, tobacco abuse EXAM: PORTABLE CHEST 1 VIEW COMPARISON:  07/07/2020 FINDINGS: 2 frontal views of the chest demonstrate a stable cardiac silhouette. Patchy retrocardiac consolidation at the medial costophrenic angle could reflect focal bronchopneumonia or atelectasis. No effusion or pneumothorax. No acute bony abnormalities. IMPRESSION: 1. Patchy retrocardiac consolidation which could reflect bronchopneumonia. Electronically Signed   By: Randa Ngo M.D.   On: 07/30/2020 01:49   DG Chest Port 1 View  Result Date: 07/07/2020 CLINICAL DATA:  Cough, altered mental status EXAM: PORTABLE CHEST 1 VIEW COMPARISON:  07/03/2020  radiograph, 11/26/2015 CT FINDINGS: Low volumes and bibasilar streaky atelectasis with vascular crowding. More patchy opacity present in the left lung base could reflect atelectasis and mediastinal fat though underlying airspace disease is not fully excluded. No pneumothorax. No layering effusion. Prominent cardiomediastinal contours may be related to portable technique. The aorta is calcified. The remaining cardiomediastinal contours are unremarkable. No acute osseous or soft tissue abnormality. Degenerative changes are present in the imaged spine and shoulders. Remote appearing posttraumatic deformity of the distal head right clavicle. Telemetry leads overlie the chest. IMPRESSION: Low volumes and atelectasis. Patchy opacity in the left lung base may reflect further volume loss, mediastinal fat, or early airspace disease. Electronically Signed   By: Lovena Le M.D.   On: 07/07/2020 14:18   ECHOCARDIOGRAM COMPLETE  Result Date:  08/01/2020    ECHOCARDIOGRAM REPORT   Patient Name:   Cory Weber Date of Exam: 08/01/2020 Medical Rec #:  [id number]        Height:       73.0 in Accession #:    [id number]       Weight:       256.4 lb Date of Birth:  07-05-1950        BSA:          2.391 m Patient Age:    70 years         BP:           130/75 mmHg Patient Gender: M                HR:           73 bpm. Exam Location:  Inpatient Procedure: 2D Echo, Cardiac Doppler and Color Doppler Indications:    NTEMI I21.4  History:        Patient has prior history of Echocardiogram examinations, most                 recent 03/20/2020. CAD.  Sonographer:    Merrie Roof RDCS Referring Phys: [id number] [place]  1. Akinesis of the basal inferolateral wall, apical hypokinesis; overall mild to moderate LV dysfunction.  2. Left ventricular ejection fraction, by estimation, is 40 to 45%. The left ventricle has mild to moderately decreased function. The left ventricle demonstrates regional wall motion abnormalities (see scoring diagram/findings for description). There is  moderate left ventricular hypertrophy. Left ventricular diastolic parameters are consistent with Grade I diastolic dysfunction (impaired relaxation).  3. Right ventricular systolic function is normal. The right ventricular size is normal.  4. The mitral valve is normal in structure. Trivial mitral valve regurgitation. No evidence of mitral stenosis.  5. The aortic valve is calcified. Aortic valve regurgitation is not visualized. No aortic stenosis is present.  6. The inferior vena cava is normal in size with greater than 50% respiratory variability, suggesting right atrial pressure of 3 mmHg. FINDINGS  Left Ventricle: Left ventricular ejection fraction, by estimation, is 40 to 45%. The left ventricle has mild to moderately decreased function. The left ventricle demonstrates regional wall motion abnormalities. Definity contrast agent was given IV to delineate the left ventricular  endocardial borders. The left ventricular internal cavity size was normal in size. There is moderate left ventricular hypertrophy. Left ventricular diastolic parameters are consistent with Grade I diastolic dysfunction (impaired relaxation). Right Ventricle: The right ventricular size is normal. Right ventricular systolic function is normal. Left Atrium: Left atrial size was normal in size. Right Atrium: Right atrial size was normal in size. Pericardium:  Trivial pericardial effusion is present. Mitral Valve: The mitral valve is normal in structure. Trivial mitral valve regurgitation. No evidence of mitral valve stenosis. Tricuspid Valve: The tricuspid valve is normal in structure. Tricuspid valve regurgitation is trivial. No evidence of tricuspid stenosis. Aortic Valve: The aortic valve is calcified. Aortic valve regurgitation is not visualized. No aortic stenosis is present. Aortic valve mean gradient measures 8.0 mmHg. Aortic valve peak gradient measures 14.1 mmHg. Pulmonic Valve: The pulmonic valve was not well visualized. Pulmonic valve regurgitation is not visualized. No evidence of pulmonic stenosis. Aorta: The aortic root is normal in size and structure. Venous: The inferior vena cava is normal in size with greater than 50% respiratory variability, suggesting right atrial pressure of 3 mmHg.  Additional Comments: Akinesis of the basal inferolateral wall, apical hypokinesis; overall mild to moderate LV dysfunction.  LEFT VENTRICLE PLAX 2D LVIDd:         4.75 cm      Diastology LVIDs:         4.60 cm      LV e' medial:    5.33 cm/s LV PW:         1.35 cm      LV E/e' medial:  13.9 LV IVS:        1.40 cm      LV e' lateral:   6.64 cm/s LVOT diam:     2.10 cm      LV E/e' lateral: 11.2 LV SV:         56 LV SV Index:   23 LVOT Area:     3.46 cm  LV Volumes (MOD) LV vol d, MOD A4C: 198.0 ml LV vol s, MOD A4C: 104.0 ml LV SV MOD A4C:     198.0 ml RIGHT VENTRICLE          IVC RV Basal diam:  3.90 cm  IVC diam: 2.20  cm LEFT ATRIUM           Index       RIGHT ATRIUM           Index LA diam:      2.90 cm 1.21 cm/m  RA Area:     15.10 cm LA Vol (A4C): 70.1 ml 29.32 ml/m RA Volume:   37.10 ml  15.52 ml/m  AORTIC VALVE AV Area (Vmax):    1.77 cm AV Area (Vmean):   1.58 cm AV Area (VTI):     1.88 cm AV Vmax:           188.00 cm/s AV Vmean:          130.000 cm/s AV VTI:            0.298 m AV Peak Grad:      14.1 mmHg AV Mean Grad:      8.0 mmHg LVOT Vmax:         96.00 cm/s LVOT Vmean:        59.400 cm/s LVOT VTI:          0.162 m LVOT/AV VTI ratio: 0.54  AORTA Ao Root diam: 3.05 cm MITRAL VALVE MV Area (PHT): 6.65 cm     SHUNTS MV Decel Time: 114 msec     Systemic VTI:  0.16 m MV E velocity: 74.10 cm/s   Systemic Diam: 2.10 cm MV A velocity: 105.00 cm/s MV E/A ratio:  0.71 Kirk Ruths MD Electronically signed by Kirk Ruths MD Signature Date/Time: 08/01/2020/1:50:59 PM    Final  Subjective: No chest pain or dyspnea. Denies bleeding. Amenable to DC.  Discharge Exam: Vitals:   08/01/20 2210 08/02/20 0543  BP:  140/79  Pulse: 88 87  Resp: 20 17  Temp:  98 F (36.7 C)  SpO2: 100% 98%   General: Pt is alert, awake, not in acute distress Cardiovascular: RRR, S1/S2 +, no rubs, no gallops Respiratory: CTA bilaterally without wheezing. Nonlabored. Abdominal: Soft, NT, ND, bowel sounds + Extremities: BL BKA, no edema, no cyanosis  Labs: BNP (last 3 results) Recent Labs    07/07/20 1343 07/30/20 0224 07/31/20 1032  BNP 326.0* 313.0* 315.4*   Basic Metabolic Panel: Recent Labs  Lab 07/30/20 0224 07/30/20 0406 07/31/20 1032 08/01/20 0210 08/02/20 0245  NA 132*  --  135 135 133*  K 3.6  --  3.6 3.6 3.9  CL 95*  --  94* 94* 92*  CO2 34*  --  37* 38* 35*  GLUCOSE 325*  --  206* 237* 260*  BUN 21  --  16 22 27*  CREATININE 1.58*  --  1.39* 1.41* 1.57*  CALCIUM 8.4*  --  8.8* 8.7* 8.7*  MG  --  1.9  --   --   --   PHOS  --  4.5  --   --   --    Liver Function Tests: Recent Labs  Lab  07/30/20 0224 07/31/20 1032  AST 14* 17  ALT 12 10  ALKPHOS 54 38  BILITOT 0.0* 0.4  PROT 10.4* 10.0*  ALBUMIN 2.7* 2.4*   Recent Labs  Lab 07/30/20 0224  LIPASE 20   No results for input(s): AMMONIA in the last 168 hours. CBC: Recent Labs  Lab 07/30/20 0224 07/31/20 1032 08/01/20 0210 08/02/20 0245  WBC 4.7 5.3 4.5 4.2  NEUTROABS 3.2  --   --   --   HGB 7.2* 7.8* 7.5* 7.2*  HCT 23.5* 24.8* 23.3* 22.6*  MCV 99.2 94.7 93.2 95.8  PLT 220 222 214 195   Cardiac Enzymes: No results for input(s): CKTOTAL, CKMB, CKMBINDEX, TROPONINI in the last 168 hours. BNP: Invalid input(s): POCBNP CBG: Recent Labs  Lab 08/01/20 0823 08/01/20 1204 08/01/20 1721 08/01/20 2039 08/02/20 0758  GLUCAP 196* 240* 364* 281* 234*   D-Dimer No results for input(s): DDIMER in the last 72 hours. Hgb A1c No results for input(s): HGBA1C in the last 72 hours. Lipid Profile No results for input(s): CHOL, HDL, LDLCALC, TRIG, CHOLHDL, LDLDIRECT in the last 72 hours. Thyroid function studies No results for input(s): TSH, T4TOTAL, T3FREE, THYROIDAB in the last 72 hours.  Invalid input(s): FREET3 Anemia work up No results for input(s): VITAMINB12, FOLATE, FERRITIN, TIBC, IRON, RETICCTPCT in the last 72 hours. Urinalysis    Component Value Date/Time   COLORURINE YELLOW 07/30/2020 0115   APPEARANCEUR TURBID (A) 07/30/2020 0115   LABSPEC 1.014 07/30/2020 0115   PHURINE 5.0 07/30/2020 0115   GLUCOSEU >=500 (A) 07/30/2020 0115   HGBUR NEGATIVE 07/30/2020 0115   BILIRUBINUR NEGATIVE 07/30/2020 0115   KETONESUR NEGATIVE 07/30/2020 0115   PROTEINUR 100 (A) 07/30/2020 0115   UROBILINOGEN 1.0 11/29/2013 1705   NITRITE NEGATIVE 07/30/2020 0115   LEUKOCYTESUR LARGE (A) 07/30/2020 0115    Microbiology Recent Results (from the past 240 hour(s))  Urine Culture     Status: Abnormal   Collection Time: 07/30/20  1:15 AM   Specimen: Urine, Clean Catch  Result Value Ref Range Status   Specimen  Description   Final    URINE,  CLEAN CATCH Performed at [facility], [address] [place], [place] [address]    Special Requests   Final    NONE Performed at [facility], [address] [place], [place] [address]    Culture (A)  Final    >=100,000 COLONIES/mL ESCHERICHIA COLI Confirmed Extended Spectrum Beta-Lactamase Producer (ESBL).  In bloodstream infections from ESBL organisms, carbapenems are preferred over piperacillin/tazobactam. They are shown to have a lower risk of mortality.    Report Status 08/01/2020 FINAL  Final   Organism ID, Bacteria ESCHERICHIA COLI (A)  Final      Susceptibility   Escherichia coli - MIC*    AMPICILLIN >=32 RESISTANT Resistant     CEFAZOLIN >=64 RESISTANT Resistant     CEFEPIME >=32 RESISTANT Resistant     CEFTRIAXONE >=64 RESISTANT Resistant     CIPROFLOXACIN >=4 RESISTANT Resistant     GENTAMICIN <=1 SENSITIVE Sensitive     IMIPENEM <=0.25 SENSITIVE Sensitive     NITROFURANTOIN <=16 SENSITIVE Sensitive     TRIMETH/SULFA >=320 RESISTANT Resistant     AMPICILLIN/SULBACTAM >=32 RESISTANT Resistant     PIP/TAZO 8 SENSITIVE Sensitive     * >=100,000 COLONIES/mL ESCHERICHIA COLI  Blood culture (routine x 2)     Status: None (Preliminary result)   Collection Time: 07/30/20  2:24 AM   Specimen: BLOOD RIGHT HAND  Result Value Ref Range Status   Specimen Description BLOOD RIGHT HAND  Final   Special Requests   Final    BOTTLES DRAWN AEROBIC AND ANAEROBIC Blood Culture adequate volume   Culture   Final    NO GROWTH 2 DAYS Performed at [facility], [address], [place], [place] [address]    Report Status PENDING  Incomplete  Blood culture (routine x 2)     Status: None (Preliminary result)   Collection Time: 07/30/20  2:35 AM   Specimen: BLOOD RIGHT ARM  Result Value Ref Range Status   Specimen Description BLOOD RIGHT ARM  Final   Special Requests   Final    BOTTLES DRAWN AEROBIC AND ANAEROBIC Blood Culture adequate volume   Culture   Final     NO GROWTH 2 DAYS Performed at [facility], [address], [place],  [address]    Report Status PENDING  Incomplete  SARS CORONAVIRUS 2 (TAT 6-24 HRS) Nasopharyngeal Nasopharyngeal Swab     Status: None   Collection Time: 07/30/20  3:39 AM   Specimen: Nasopharyngeal Swab  Result Value Ref Range Status   SARS Coronavirus 2 NEGATIVE NEGATIVE Final    Comment: (NOTE) SARS-CoV-2 target nucleic acids are NOT DETECTED.  The SARS-CoV-2 RNA is generally detectable in upper and lower respiratory specimens during the acute phase of infection. Negative results do not preclude SARS-CoV-2 infection, do not rule out co-infections with other pathogens, and should not be used as the sole basis for treatment or other patient management decisions. Negative results must be combined with clinical observations, patient history, and epidemiological information. The expected result is Negative.  Fact Sheet for Patients: SugarRoll.be  Fact Sheet for Healthcare Providers: https://www.woods-mathews.com/  This test is not yet approved or cleared by the [place] FDA and  has been authorized for detection and/or diagnosis of SARS-CoV-2 by FDA under an Emergency Use Authorization (EUA). This EUA will remain  in effect (meaning this test can be used) for the duration of the COVID-19 declaration under Se ction 564(b)(1) of the Act, 21 U.S.C. section 360bbb-3(b)(1), unless the authorization is terminated or  revoked sooner.  Performed at [facility], [place] [address], [place], [place] [address]     Time coordinating discharge: Approximately 40 minutes  Patrecia Pour, MD  [facility] 08/02/2020, 9:56 AM

## 2020-08-02 NOTE — Plan of Care (Signed)

## 2020-08-02 NOTE — Progress Notes (Signed)
Report called to [facility], [place] @ [employer].

## 2020-08-02 NOTE — NC FL2 (Signed)
[place] LEVEL OF CARE SCREENING TOOL     IDENTIFICATION  Patient Name: Cory Weber Birthdate: [birth year] Sex: male Admission Date (Current Location): 07/29/2020  [facility] and [place] Number:  Herbalist and Address:  [facility], [place] [address] [place], [place] [address]      Provider Number: [id number]  Attending Physician Name and Address:  Patrecia Pour, MD  Relative Name and Phone Number:  [name] [phone]    Current Level of Care: Hospital Recommended Level of Care: [place] Prior Approval Number:    Date Approved/Denied:   PASRR Number: [id number]  Discharge Plan: SNF    Current Diagnoses: Patient Active Problem List   Diagnosis Date Noted   Hypoalbuminemia 07/30/2020   Elevated d-dimer 07/30/2020   Elevated brain natriuretic peptide (BNP) level 07/30/2020   Elevated troponin I level 07/30/2020   Hyperglycemia due to diabetes mellitus ([place]) 07/30/2020   CAD (coronary artery disease) 07/30/2020   Elevated PSA 07/25/2020   Urinary retention 07/25/2020   Chronic respiratory failure with hypoxia (HCC)    Symptomatic anemia 06/23/2020   COPD with acute exacerbation ([place]) 06/10/2020   Acute respiratory failure with hypoxia and hypercarbia ([place]) 06/10/2020   COPD (chronic obstructive pulmonary disease) ([place])    Acute on chronic anemia 06/01/2020   Class 2 obesity due to excess calories with body mass index (BMI) of 35.0 to 35.9 in adult    Acute on chronic systolic CHF (congestive heart failure) ([place]) 04/27/2020   AVM (arteriovenous malformation) of small bowel, acquired    Gastritis and gastroduodenitis    Gastric diverticulum    Grade I internal hemorrhoids    Occult blood in stools    Heart failure ([place]) 03/20/2020   NSTEMI (non-ST elevated myocardial infarction) ([place]) 03/20/2020   Pneumonia due to COVID-19 virus 02/24/2019   Acute on chronic respiratory failure with hypoxia ([place])  02/20/2019   COVID-19 virus detected 02/20/2019   DNR (do not resuscitate) 02/20/2019   PAD (peripheral artery disease) ([place]) 08/22/2016   Osteomyelitis of foot ([place]) 07/08/2016   S/P transmetatarsal amputation of foot, right ([place]) 06/26/2016   Constipation due to opioid therapy 06/15/2016   Physical deconditioning 06/10/2016   Chronic diastolic CHF (congestive heart failure) ([place]) 06/10/2016   Ischemic pain of right foot 06/04/2016   Ischemia of foot 05/31/2016   Chronic gout due to renal impairment involving toe of right foot without tophus 02/05/2016   Type 2 diabetes with nephropathy ([place]) 01/21/2016   Uncontrolled type II diabetes with peripheral autonomic neuropathy ([place]) 01/17/2016   Edema of right lower extremity 12/20/2015   Cough 11/26/2015   GERD (gastroesophageal reflux disease) 10/08/2015   Amputation of left lower extremity below knee ([place]) 08/21/2015   Phantom limb pain (HCC)    Anemia of chronic disease    HLD (hyperlipidemia)    BPH (benign prostatic hyperplasia)    ETOH abuse    PVD (peripheral vascular disease) ([place])    Chronic hepatitis C without hepatic coma ([place])    History of CVA (cerebrovascular accident) without residual deficits    Hyponatremia    Chronic kidney disease, stage 3 ([place]) 07/18/2015   Essential hypertension 07/18/2015   Acute blood loss anemia 12/01/2013   Constipation 12/01/2013   S/P lumbar spinal fusion 11/25/2013   Melena 12/03/2011   Hemiparesthesia 02/03/2011   Polysubstance abuse ([place]) 02/03/2011   Tobacco abuse 02/03/2011    Orientation RESPIRATION BLADDER Height & Weight  Self, Time, Situation, Place (WDL)  O2 (Nasal Cannula 3 liters) Continent, External catheter (External Urinary Catheter) Weight: 256 lb 6.4 oz (116.3 kg) Height:  6\' 1"  (185.4 cm)  BEHAVIORAL SYMPTOMS/MOOD NEUROLOGICAL BOWEL NUTRITION STATUS      Continent Diet (Please see discharge summary)  AMBULATORY STATUS COMMUNICATION OF NEEDS Skin     Verbally  Normal                       Personal Care Assistance Level of Assistance  Bathing, Feeding, Dressing Bathing Assistance: Limited assistance Feeding assistance: Independent Dressing Assistance: Limited assistance     Functional Limitations Info  Sight, Hearing, Speech Sight Info: Adequate Hearing Info: Adequate Speech Info: Adequate    SPECIAL CARE FACTORS FREQUENCY  PT (By licensed PT), OT (By licensed OT)     PT Frequency: 5x min weekly OT Frequency: 5x min weekly            Contractures Contractures Info: Not present    Additional Factors Info  Code Status, Allergies, Insulin Sliding Scale, Isolation Precautions, Psychotropic Code Status Info: DNR Allergies Info: No known allergies Psychotropic Info: cloNIDine (CATAPRES) tablet 0.2 mg 2 times daily Insulin Sliding Scale Info: insulin aspart (novoLOG) injection 0-5 Units daily at bedtime,insulin aspart (novoLOG) injection 0-9 Units 3 times daily with meals,insulin glargine (LANTUS) injection 8 Units daily at bedtime Isolation Precautions Info: Urine Culture (+) ESBL     Current Medications (08/02/2020):  This is the current hospital active medication list Current Facility-Administered Medications  Medication Dose Route Frequency Provider Last Rate Last Admin   0.9 %  sodium chloride infusion (Manually program via Guardrails IV Fluids)   Intravenous Once Adefeso, Oladapo, DO       0.9 %  sodium chloride infusion (Manually program via Guardrails IV Fluids)   Intravenous Once Patrecia Pour, MD       aspirin EC tablet 81 mg  81 mg Oral Q breakfast Adefeso, Oladapo, DO   81 mg at 08/02/20 0727   carvedilol (COREG) tablet 25 mg  25 mg Oral BID WC Adefeso, Oladapo, DO   25 mg at 08/02/20 3154   cloNIDine (CATAPRES) tablet 0.2 mg  0.2 mg Oral BID Geralynn Rile, MD   0.2 mg at 08/02/20 0859   clopidogrel (PLAVIX) tablet 75 mg  75 mg Oral Daily Adefeso, Oladapo, DO   75 mg at 08/02/20 0859   feeding supplement  (GLUCERNA SHAKE) (GLUCERNA SHAKE) liquid 237 mL  237 mL Oral TID BM Adefeso, Oladapo, DO   237 mL at 08/02/20 0907   furosemide (LASIX) tablet 80 mg  80 mg Oral Daily Buford Dresser, MD   80 mg at 08/02/20 0859   gabapentin (NEURONTIN) capsule 300 mg  300 mg Oral BID Vianne Bulls, MD   300 mg at 08/02/20 0859   hydrALAZINE (APRESOLINE) tablet 100 mg  100 mg Oral Q8H Little Ishikawa, MD   100 mg at 08/02/20 0544   insulin aspart (novoLOG) injection 0-5 Units  0-5 Units Subcutaneous QHS Adefeso, Oladapo, DO   3 Units at 08/01/20 2200   insulin aspart (novoLOG) injection 0-9 Units  0-9 Units Subcutaneous TID WC Adefeso, Oladapo, DO   3 Units at 08/02/20 0808   insulin glargine (LANTUS) injection 18 Units  18 Units Subcutaneous QHS Little Ishikawa, MD   18 Units at 08/01/20 2201   ipratropium-albuterol (DUONEB) 0.5-2.5 (3) MG/3ML nebulizer solution 3 mL  3 mL Nebulization Q6H PRN Adefeso,  Oladapo, DO       iron polysaccharides (NIFEREX) capsule 150 mg  150 mg Oral BID Adefeso, Oladapo, DO   150 mg at 08/02/20 0859   isosorbide mononitrate (IMDUR) 24 hr tablet 180 mg  180 mg Oral Daily Arnoldo Lenis, MD   180 mg at 08/02/20 0859   melatonin tablet 6 mg  6 mg Oral QHS Adefeso, Oladapo, DO   6 mg at 08/01/20 2156   morphine 2 MG/ML injection 1 mg  1 mg Intravenous Q4H PRN Little Ishikawa, MD   1 mg at 08/01/20 1622   nitroGLYCERIN (NITROSTAT) SL tablet 0.4 mg  0.4 mg Sublingual Q5 min PRN Little Ishikawa, MD       oxyCODONE-acetaminophen (PERCOCET/ROXICET) 5-325 MG per tablet 1 tablet  1 tablet Oral Q6H PRN Little Ishikawa, MD   1 tablet at 08/02/20 1012   pantoprazole (PROTONIX) EC tablet 40 mg  40 mg Oral BID Adefeso, Oladapo, DO   40 mg at 08/02/20 0904   polyethylene glycol (MIRALAX / GLYCOLAX) packet 17 g  17 g Oral Daily Adefeso, Oladapo, DO   17 g at 08/02/20 0857   rosuvastatin (CRESTOR) tablet 20 mg  20 mg Oral QHS Adefeso, Oladapo, DO   20 mg at 08/01/20  2156   spironolactone (ALDACTONE) tablet 25 mg  25 mg Oral Daily Geralynn Rile, MD   25 mg at 08/02/20 0859   tamsulosin (FLOMAX) capsule 0.4 mg  0.4 mg Oral Daily Adefeso, Oladapo, DO   0.4 mg at 08/02/20 2778   vitamin B-12 (CYANOCOBALAMIN) tablet 500 mcg  500 mcg Oral Daily Adefeso, Oladapo, DO   500 mcg at 08/02/20 2423     Discharge Medications: Please see discharge summary for a list of discharge medications.  Relevant Imaging Results:  Relevant Lab Results:   Additional Information [id number]  Trula Ore, LCSWA

## 2020-08-02 NOTE — TOC Transition Note (Signed)
Transition of Care [facility]) - CM/SW Discharge Note   Patient Details  Name: Cory Weber MRN: [id number] Date of Birth: [birth year]  [facility] of [facility] CM/SW Contact:  Trula Ore, [place] Phone Number: 08/02/2020, 12:07 PM   Clinical Narrative:     Patient will DC to: [facility]F  Anticipated DC date: 08/02/2020  Family notified: [name]  Transport by: Corey Harold  ?  Per MD patient ready for DC to [facility] with palliative services to follow. RN, patient, patient's family, Colletta [place] with [facility] facility notified of DC. Discharge Summary sent to facility .RN given number for report.tele#[phone] RM# B1 first bed. DC packet on chart. DNR signed by MD attached to patients DC packet.Ambulance transport requested for patient.  CSW signing off.   Final next level of care: Skilled Nursing Facility Barriers to Discharge: No Barriers Identified   Patient Goals and CMS Choice Patient states their goals for this hospitalization and ongoing recovery are:: to go to SNF CMS Medicare.gov Compare Post Acute Care list provided to:: Patient Choice offered to / list presented to : Patient  Discharge Placement              Patient chooses bed at:  [facility]) Patient to be transferred to facility by: [place] Name of family member notified: [name] Patient and family notified of of transfer: 08/02/20  Discharge Plan and Services In-house Referral: Clinical Social Work                                   Social Determinants of Health ([place]) Interventions     Readmission Risk Interventions Readmission Risk Prevention Plan 03/20/2020  Transportation Screening Complete  PCP or Specialist Appt within 3-5 Days Not Complete  Not Complete comments Patient resides at [facility] and the facility schedules hospital follow up appointments.  [place] or Home Care Consult Complete  Social Work Consult for [place] Planning/Counseling Complete   Palliative Care Screening Not Applicable  Medication Review Press photographer) Complete  Some recent data might be hidden

## 2020-08-03 LAB — TYPE AND SCREEN
ABO/RH(D): O POS
Antibody Screen: NEGATIVE
Unit division: 0

## 2020-08-03 LAB — BPAM RBC
Blood Product Expiration Date: 202207262359
ISSUE DATE / TIME: 202206221152
Unit Type and Rh: 5100

## 2020-08-04 LAB — CULTURE, BLOOD (ROUTINE X 2)
Culture: NO GROWTH
Culture: NO GROWTH
Special Requests: ADEQUATE
Special Requests: ADEQUATE

## 2020-08-07 ENCOUNTER — Encounter (HOSPITAL_COMMUNITY): Payer: Medicare (Managed Care)

## 2020-08-08 ENCOUNTER — Ambulatory Visit (HOSPITAL_COMMUNITY): Payer: Medicare (Managed Care)

## 2020-08-09 ENCOUNTER — Encounter (HOSPITAL_COMMUNITY): Payer: Self-pay

## 2020-08-09 NOTE — Progress Notes (Signed)
Per Delcie Roch at [facility], patient does not wish to proceed with PET scan or BMBx, though does wish to proceed with iron infusions. MD aware of patient's wishes, scheduled appropriately for iron infusions and follow-up office visit.

## 2020-08-10 ENCOUNTER — Ambulatory Visit (HOSPITAL_COMMUNITY): Payer: Medicare (Managed Care)

## 2020-08-14 ENCOUNTER — Encounter (HOSPITAL_COMMUNITY): Payer: Self-pay | Admitting: Emergency Medicine

## 2020-08-14 ENCOUNTER — Other Ambulatory Visit: Payer: Self-pay

## 2020-08-14 ENCOUNTER — Emergency Department (HOSPITAL_COMMUNITY)
Admission: EM | Admit: 2020-08-14 | Discharge: 2020-08-14 | Disposition: A | Discharge status: Skilled Nursing Facility | Payer: Medicare (Managed Care) | Attending: Emergency Medicine | Admitting: Emergency Medicine

## 2020-08-14 ENCOUNTER — Emergency Department (HOSPITAL_COMMUNITY): Payer: Medicare (Managed Care)

## 2020-08-14 DIAGNOSIS — J441 Chronic obstructive pulmonary disease with (acute) exacerbation: Secondary | ICD-10-CM | POA: Diagnosis not present

## 2020-08-14 DIAGNOSIS — Z7902 Long term (current) use of antithrombotics/antiplatelets: Secondary | ICD-10-CM | POA: Diagnosis not present

## 2020-08-14 DIAGNOSIS — S0990XA Unspecified injury of head, initial encounter: Secondary | ICD-10-CM | POA: Diagnosis not present

## 2020-08-14 DIAGNOSIS — Z87891 Personal history of nicotine dependence: Secondary | ICD-10-CM | POA: Diagnosis not present

## 2020-08-14 DIAGNOSIS — I130 Hypertensive heart and chronic kidney disease with heart failure and stage 1 through stage 4 chronic kidney disease, or unspecified chronic kidney disease: Secondary | ICD-10-CM | POA: Insufficient documentation

## 2020-08-14 DIAGNOSIS — E1122 Type 2 diabetes mellitus with diabetic chronic kidney disease: Secondary | ICD-10-CM | POA: Insufficient documentation

## 2020-08-14 DIAGNOSIS — D631 Anemia in chronic kidney disease: Secondary | ICD-10-CM | POA: Diagnosis not present

## 2020-08-14 DIAGNOSIS — W06XXXA Fall from bed, initial encounter: Secondary | ICD-10-CM | POA: Insufficient documentation

## 2020-08-14 DIAGNOSIS — N183 Chronic kidney disease, stage 3 unspecified: Secondary | ICD-10-CM | POA: Insufficient documentation

## 2020-08-14 DIAGNOSIS — I5023 Acute on chronic systolic (congestive) heart failure: Secondary | ICD-10-CM | POA: Diagnosis not present

## 2020-08-14 DIAGNOSIS — J45909 Unspecified asthma, uncomplicated: Secondary | ICD-10-CM | POA: Diagnosis not present

## 2020-08-14 DIAGNOSIS — Z794 Long term (current) use of insulin: Secondary | ICD-10-CM | POA: Insufficient documentation

## 2020-08-14 DIAGNOSIS — I2510 Atherosclerotic heart disease of native coronary artery without angina pectoris: Secondary | ICD-10-CM | POA: Insufficient documentation

## 2020-08-14 DIAGNOSIS — Z7982 Long term (current) use of aspirin: Secondary | ICD-10-CM | POA: Insufficient documentation

## 2020-08-14 DIAGNOSIS — W19XXXA Unspecified fall, initial encounter: Secondary | ICD-10-CM

## 2020-08-14 DIAGNOSIS — Z79899 Other long term (current) drug therapy: Secondary | ICD-10-CM | POA: Diagnosis not present

## 2020-08-14 NOTE — ED Triage Notes (Signed)
Pt brought in by EMS from [facility] for requesting head CT after fall. Pt fell out of bed earlier tonight and has headache. According to staff pt requesting head CT. Pt currently on palliative care at [facility].

## 2020-08-14 NOTE — Discharge Instructions (Addendum)
The CT scans did not show any signs of serious injury.  Continue your current medications.

## 2020-08-14 NOTE — ED Notes (Signed)
Report called to [place]; Sana

## 2020-08-14 NOTE — ED Provider Notes (Signed)
[facility] EMERGENCY DEPARTMENT Provider Note   CSN: [id number] Arrival date & time: 08/14/20  2423     History Chief Complaint  Patient presents with   Lytle Michaels    LUPE BONNER is a 70 y.o. male.   Fall   Patient presents to the emergency room for evaluation after fall.  Patient is currently residing in a nursing facility.  He has history of multiple medical problems including severe coronary artery disease and is no longer a candidate for revascularization procedures.  Patient fell out of bed earlier tonight and now has a headache.  He was sent to the ED for evaluation.  Patient denies any vomiting or diarrhea.  No chest pain or shortness of breath.  No fevers or chills.  Past Medical History:  Diagnosis Date   Acute anemia 06/01/2020   Anemia    Asthma    BPH (benign prostatic hyperplasia)    CAD (coronary artery disease)    Severe multivessel disease 03/2020 - poor revascularization options   Chronic diastolic heart failure (HCC)    CKD (chronic kidney disease) stage 3, GFR 30-59 ml/min (HCC)    COPD (chronic obstructive pulmonary disease) (HCC)    CVA (cerebral vascular accident) ([place]) 2015   Essential hypertension    Gangrene ([place]) 06/10/2016   GERD (gastroesophageal reflux disease)    Hepatitis C    History of pneumonia    Hyperlipidemia    Osteomyelitis (HCC)    PAD (peripheral artery disease) (HCC)    Bilateral BKA   Peripheral neuropathy    Type II diabetes mellitus ([place])     Patient Active Problem List   Diagnosis Date Noted   Hypoalbuminemia 07/30/2020   Elevated d-dimer 07/30/2020   Elevated brain natriuretic peptide (BNP) level 07/30/2020   Elevated troponin I level 07/30/2020   Hyperglycemia due to diabetes mellitus ([place]) 07/30/2020   CAD (coronary artery disease) 07/30/2020   Elevated PSA 07/25/2020   Urinary retention 07/25/2020   Chronic respiratory failure with hypoxia (HCC)    Symptomatic anemia 06/23/2020   COPD with acute exacerbation  ([place]) 06/10/2020   Acute respiratory failure with hypoxia and hypercarbia (HCC) 06/10/2020   COPD (chronic obstructive pulmonary disease) (HCC)    Acute on chronic anemia 06/01/2020   Class 2 obesity due to excess calories with body mass index (BMI) of 35.0 to 35.9 in adult    Acute on chronic systolic CHF (congestive heart failure) ([place]) 04/27/2020   AVM (arteriovenous malformation) of small bowel, acquired    Gastritis and gastroduodenitis    Gastric diverticulum    Grade I internal hemorrhoids    Occult blood in stools    Heart failure ([place]) 03/20/2020   NSTEMI (non-ST elevated myocardial infarction) ([place]) 03/20/2020   Pneumonia due to COVID-19 virus 02/24/2019   Acute on chronic respiratory failure with hypoxia ([place]) 02/20/2019   COVID-19 virus detected 02/20/2019   DNR (do not resuscitate) 02/20/2019   PAD (peripheral artery disease) ([place]) 08/22/2016   Osteomyelitis of foot ([place]) 07/08/2016   S/P transmetatarsal amputation of foot, right ([place]) 06/26/2016   Constipation due to opioid therapy 06/15/2016   Physical deconditioning 06/10/2016   Chronic diastolic CHF (congestive heart failure) ([place]) 06/10/2016   Ischemic pain of right foot 06/04/2016   Ischemia of foot 05/31/2016   Chronic gout due to renal impairment involving toe of right foot without tophus 02/05/2016   Type 2 diabetes with nephropathy ([place]) 01/21/2016   Uncontrolled type II diabetes with peripheral autonomic neuropathy ([place])  01/17/2016   Edema of right lower extremity 12/20/2015   Cough 11/26/2015   GERD (gastroesophageal reflux disease) 10/08/2015   Amputation of left lower extremity below knee ([place]) 08/21/2015   Phantom limb pain (HCC)    Anemia of chronic disease    HLD (hyperlipidemia)    BPH (benign prostatic hyperplasia)    ETOH abuse    PVD (peripheral vascular disease) ([place])    Chronic hepatitis C without hepatic coma ([place])    History of CVA (cerebrovascular accident) without residual deficits     Hyponatremia    Chronic kidney disease, stage 3 ([place]) 07/18/2015   Essential hypertension 07/18/2015   Acute blood loss anemia 12/01/2013   Constipation 12/01/2013   S/P lumbar spinal fusion 11/25/2013   Melena 12/03/2011   Hemiparesthesia 02/03/2011   Polysubstance abuse ([place]) 02/03/2011   Tobacco abuse 02/03/2011    Past Surgical History:  Procedure Laterality Date   ABDOMINAL AORTOGRAM N/A 06/03/2016   Procedure: Abdominal Aortogram;  Surgeon: Waynetta Sandy, MD;  Location: [place] [facility];  Service: Cardiovascular;  Laterality: N/A;   AMPUTATION Left 08/16/2015   Procedure: LEFT BELOW THE KNEE AMPUTATION ;  Surgeon: Serafina Mitchell, MD;  Location: [place];  Service: Vascular;  Laterality: Left;   AMPUTATION Right 08/22/2016   Procedure: AMPUTATION BELOW KNEE-RIGHT;  Surgeon: Waynetta Sandy, MD;  Location: [place];  Service: Vascular;  Laterality: Right;   BACK SURGERY     BELOW KNEE LEG AMPUTATION Left 08/16/2015   BIOPSY N/A 09/03/2012   Procedure: BIOPSY;  Surgeon: Daneil Dolin, MD;  [facility]: [facility];  Service: Endoscopy;  Laterality: N/A;  gastric and gastric mucosa   BIOPSY N/A 12/03/2012   Procedure: BIOPSY;  Surgeon: Daneil Dolin, MD;  Location: [facility];  Service: Endoscopy;  Laterality: N/A;   BIOPSY  03/31/2020   Procedure: BIOPSY;  Surgeon: Ladene Artist, MD;  Location: [place];  Service: Endoscopy;;   COLONOSCOPY WITH PROPOFOL N/A 09/03/2012   DTO:IZTIWPY polyp-removed as outlined above. Prominent internal hemorrhoids. Tubular adenoma   COLONOSCOPY WITH PROPOFOL N/A 04/05/2020   Procedure: COLONOSCOPY WITH PROPOFOL;  Surgeon: Lavena Bullion, DO;  Location: [place];  Service: Gastroenterology;  Laterality: N/A;   ENTEROSCOPY Left 04/05/2020   Procedure: ENTEROSCOPY;  Surgeon: Lavena Bullion, DO;  Location: [place];  Service: Gastroenterology;  Laterality: Left;   ENTEROSCOPY N/A 04/07/2020   Procedure: ENTEROSCOPY;  Surgeon:  Lavena Bullion, DO;  Location: [place];  Service: Gastroenterology;  Laterality: N/A;   ESOPHAGOGASTRODUODENOSCOPY (EGD) WITH PROPOFOL N/A 09/03/2012   KDX:IPJASN hernia. Gastric diverticulum. Gastric ulcers with associated erosions. Duodenal erosions. Status post gastric biopsy. H.PYLORI gastritis    ESOPHAGOGASTRODUODENOSCOPY (EGD) WITH PROPOFOL N/A 12/03/2012   Dr. Gala Romney: gastric diverticulum, gastric erosions and scar. Previously noted gastric ulcer completed healed. Biopsy without H.pylori.    ESOPHAGOGASTRODUODENOSCOPY (EGD) WITH PROPOFOL N/A 01/23/2016   Procedure: ESOPHAGOGASTRODUODENOSCOPY (EGD) WITH PROPOFOL;  Surgeon: Doran Stabler, MD;  [facility];  Service: Gastroenterology;  Laterality: N/A;   ESOPHAGOGASTRODUODENOSCOPY (EGD) WITH PROPOFOL N/A 03/31/2020   Procedure: ESOPHAGOGASTRODUODENOSCOPY (EGD) WITH PROPOFOL;  Surgeon: Ladene Artist, MD;  Location: [facility];  Service: Endoscopy;  Laterality: N/A;   ESOPHAGOGASTRODUODENOSCOPY (EGD) WITH PROPOFOL N/A 06/02/2020   Procedure: ESOPHAGOGASTRODUODENOSCOPY (EGD) WITH PROPOFOL;  Surgeon: Eloise Harman, DO;  [facility];  Service: Endoscopy;  Laterality: N/A;   GIVENS CAPSULE STUDY N/A 04/05/2020   Procedure: GIVENS CAPSULE STUDY;  Surgeon: Bryan Lemma,  Dominic Pea, DO;  Location: [place] [facility];  Service: Gastroenterology;  Laterality: N/A;   HOT HEMOSTASIS N/A 04/07/2020   Procedure: HOT HEMOSTASIS (ARGON PLASMA COAGULATION/BICAP);  Surgeon: Lavena Bullion, DO;  Location: [facility];  Service: Gastroenterology;  Laterality: N/A;   I & D EXTREMITY Right 07/11/2016   Procedure: DELAY PRIMARY CLOSURE FOOT AMPUTATION;  Surgeon: Edrick Kins, DPM;  Location: [place];  Service: Podiatry;  Laterality: Right;   LEFT HEART CATH AND CORONARY ANGIOGRAPHY N/A 03/21/2020   Procedure: LEFT HEART CATH AND CORONARY ANGIOGRAPHY;  Surgeon: Martinique, Peter M, MD;  Location: [place] [facility];  Service: Cardiovascular;   Laterality: N/A;   LIVER BIOPSY  2005   Done in [place], [place]. Chronic hepatitis with mild periportal inflammation, lobular unicellular necrosis and portal fibrosis. Grade 2, stage 1-2.   LOWER EXTREMITY ANGIOGRAPHY Right 06/03/2016   Procedure: Lower Extremity Angiography;  Surgeon: Waynetta Sandy, MD;  Location: [place] [facility];  Service: Cardiovascular;  Laterality: Right;   MAXIMUM ACCESS (MAS)POSTERIOR LUMBAR INTERBODY FUSION (PLIF) 1 LEVEL N/A 11/25/2013   Procedure: FOR MAXIMUM ACCESS (MAS) POSTERIOR LUMBAR INTERBODY FUSION (PLIF) 1 LEVEL;  Surgeon: Eustace Moore, MD;  Location: [place] [facility];  Service: Neurosurgery;  Laterality: N/A;  FOR MAXIMUM ACCESS (MAS) POSTERIOR LUMBAR INTERBODY FUSION (PLIF) 1 LEVEL LUMBAR 3-4   PERIPHERAL VASCULAR ATHERECTOMY Right 06/03/2016   Procedure: Peripheral Vascular Atherectomy;  Surgeon: Waynetta Sandy, MD;  Location: [place] [facility];  Service: Cardiovascular;  Laterality: Right;  SFA WITH STENT   PERIPHERAL VASCULAR CATHETERIZATION Left 08/01/2015   Procedure: Lower Extremity Angiography;  Surgeon: Serafina Mitchell, MD;  Location: [place] [facility];  Service: Cardiovascular;  Laterality: Left;   PERIPHERAL VASCULAR CATHETERIZATION N/A 08/01/2015   Procedure: Abdominal Aortogram;  Surgeon: Serafina Mitchell, MD;  Location: [place] [facility];  Service: Cardiovascular;  Laterality: N/A;   PERIPHERAL VASCULAR CATHETERIZATION N/A 08/08/2015   Procedure: Abdominal Aortogram w/Lower Extremity;  Surgeon: Serafina Mitchell, MD;  Location: [place] [facility];  Service: Cardiovascular;  Laterality: N/A;   PERIPHERAL VASCULAR CATHETERIZATION Left 08/08/2015   Procedure: Peripheral Vascular Balloon Angioplasty;  Surgeon: Serafina Mitchell, MD;  Location: [place] [facility];  Service: Cardiovascular;  Laterality: Left;  left popiteal artery, left peronealtrunk, left post tibial   POLYPECTOMY N/A 09/03/2012   Procedure: POLYPECTOMY;  Surgeon: Daneil Dolin, MD;  [facility];  Service: Endoscopy;  Laterality: N/A;  cecal polyp   SUBMUCOSAL TATTOO INJECTION  04/07/2020   Procedure: SUBMUCOSAL TATTOO INJECTION;  Surgeon: Lavena Bullion, DO;  Location: [place] [facility];  Service: Gastroenterology;;   TONSILLECTOMY     TRANSMETATARSAL AMPUTATION Right 06/13/2016   Procedure: RIGHT TRANSMETATARSAL AMPUTATION;  Surgeon: Waynetta Sandy, MD;  Location: [facility];  Service: Vascular;  Laterality: Right;   TRANSMETATARSAL AMPUTATION Right 07/10/2016   Procedure: REVISION RIGHT TRANSMETATARSAL AMPUTATION;  Surgeon: Waynetta Sandy, MD;  Location: [place];  Service: Vascular;  Laterality: Right;       Family History  Problem Relation Age of Onset   Breast cancer Mother    Heart failure Mother    Diabetes Father    Hypertension Father    Heart disease Father    Hyperlipidemia Father    Heart failure Father    Diabetes Sister    Hypertension Sister    Breast cancer Sister    Colon cancer Neg Hx    Liver disease Neg Hx  Social History   Tobacco Use   Smoking status: Former    Packs/day: 1.00    Years: 47.00    Pack years: 47.00    Types: Cigarettes    Quit date: 07/13/2015    Years since quitting: 5.0   Smokeless tobacco: Never  Vaping Use   Vaping Use: Never used  Substance Use Topics   Alcohol use: No    Alcohol/week: 0.0 standard drinks    Comment:  none 08/15/2015 "stopped drinking 3-4 years ago"   Drug use: No    Comment: 08/15/2015 "last used cocaine 3-4 months ago"    Home Medications Prior to Admission medications   Medication Sig Start Date End Date Taking? Authorizing Provider  acetaminophen (TYLENOL) 500 MG tablet Take 1,000 mg by mouth every 6 (six) hours as needed for headache (pain).    [provider]  aspirin EC 81 MG tablet Take 1 tablet (81 mg total) by mouth daily with breakfast. 02/24/19   Roxan Hockey, MD  baclofen (LIORESAL) 10 MG tablet Take 1 tablet (10 mg total) by mouth every  8 (eight) hours as needed for muscle spasms. For shoulder spasm 06/03/20   Manuella Ghazi, Pratik D, DO  bisacodyl (DULCOLAX) 10 MG suppository Place 10 mg rectally daily as needed (constipation).    [provider]  carvedilol (COREG) 25 MG tablet Take 1 tablet (25 mg total) by mouth 2 (two) times daily with a meal. 04/08/20   Regalado, Belkys A, MD  cloNIDine (CATAPRES) 0.2 MG tablet Take 1 tablet (0.2 mg total) by mouth 2 (two) times daily. 08/02/20   Patrecia Pour, MD  clopidogrel (PLAVIX) 75 MG tablet Take 75 mg by mouth daily.    [provider]  DULoxetine (CYMBALTA) 60 MG capsule Take 60 mg by mouth daily.    [provider]  ferrous gluconate (FERGON) 324 MG tablet Take 324 mg by mouth daily.    [provider]  furosemide (LASIX) 80 MG tablet Take 80 mg by mouth daily. 06/09/20   [provider]  gabapentin (NEURONTIN) 300 MG capsule Take 300 mg by mouth 3 (three) times daily.    [provider]  hydrALAZINE (APRESOLINE) 100 MG tablet Take 1 tablet (100 mg total) by mouth 3 (three) times daily. 08/02/20   Patrecia Pour, MD  insulin aspart (NOVOLOG FLEXPEN) 100 UNIT/ML FlexPen Inject 8 Units into the skin See admin instructions. Inject 8 units subcutaneously three times daily with meals - also inject 2-10 units subcutaneously before meals and at bedtime per sliding scale: CBG 200-250 2 units, 251-300 4 units, 301-350 6 units, 351-400 8 units, >400 10 units and notify MD    [provider]  insulin glargine (LANTUS) 100 UNIT/ML injection Inject 0.18 mLs (18 Units total) into the skin at bedtime. 06/24/20   Barton Dubois, MD  ipratropium-albuterol (DUONEB) 0.5-2.5 (3) MG/3ML SOLN Take 3 mLs by nebulization every 6 (six) hours as needed (SOB/ wheezing).    [provider]  iron polysaccharides (NIFEREX) 150 MG capsule Take 1 capsule (150 mg total) by mouth 2 (two) times daily. 06/24/20   Barton Dubois, MD  isosorbide mononitrate (IMDUR) 60  MG 24 hr tablet Take 3 tablets (180 mg total) by mouth daily. 08/02/20   Patrecia Pour, MD  Lidocaine 4 % PTCH Place 2 patches onto the skin See admin instructions. Apply 1 patch transdermally one time daily to each knee for pain - remove after 12 hours    [provider]  loratadine (CLARITIN) 10 MG tablet Take 10 mg by mouth daily.    [provider]  Melatonin 5 MG TABS Take 5 mg by mouth at bedtime.    [provider]  omeprazole (PRILOSEC) 20 MG capsule Take 20 mg by mouth 2 (two) times daily. 07/03/20   [provider]  pantoprazole (PROTONIX) 40 MG tablet Take 1 tablet (40 mg total) by mouth 2 (two) times daily. 06/24/20   Barton Dubois, MD  polyethylene glycol (MIRALAX) 17 g packet Take 17 g by mouth daily. 07/04/20   Tacy Learn, PA-C  rosuvastatin (CRESTOR) 20 MG tablet Take 1 tablet (20 mg total) by mouth daily. 04/08/20   Regalado, Belkys A, MD  senna (SENOKOT) 8.6 MG tablet Take 2 tablets by mouth 2 (two) times daily.    [provider]  sodium phosphate (FLEET) 7-19 GM/118ML ENEM Place 1 enema rectally every 8 (eight) hours as needed (no bowel movement within 12 hours of giving dulcolax suppository).    [provider]  spironolactone (ALDACTONE) 25 MG tablet Take 1 tablet (25 mg total) by mouth daily. 08/03/20   Patrecia Pour, MD  tamsulosin (FLOMAX) 0.4 MG CAPS capsule Take 1 capsule (0.4 mg total) by mouth 2 (two) times daily. 07/25/20   McKenzie, Candee Furbish, MD  vitamin B-12 (CYANOCOBALAMIN) 500 MCG tablet Take 500 mcg by mouth daily.     [provider]    Allergies    Patient has no known allergies.  Review of Systems   Review of Systems  All other systems reviewed and are negative.  Physical Exam Updated Vital Signs BP (!) 171/84   Pulse 90   Temp 98.6 F (37 C) (Axillary)   Resp 17   Ht 1.854 m (6\' 1" )   Wt 116.3 kg   SpO2 97%   BMI 33.83 kg/m   Physical Exam Vitals and nursing note reviewed.   Constitutional:      Appearance: He is well-developed. He is not diaphoretic.  HENT:     Head: Normocephalic and atraumatic.     Right Ear: External ear normal.     Left Ear: External ear normal.  Eyes:     General: No scleral icterus.       Right eye: No discharge.        Left eye: No discharge.     Conjunctiva/sclera: Conjunctivae normal.  Neck:     Trachea: No tracheal deviation.  Cardiovascular:     Rate and Rhythm: Normal rate and regular rhythm.  Pulmonary:     Effort: Pulmonary effort is normal. No respiratory distress.     Breath sounds: Normal breath sounds. No stridor. No wheezing or rales.  Abdominal:     General: Bowel sounds are normal. There is no distension.     Palpations: Abdomen is soft.     Tenderness: There is no abdominal tenderness. There is no guarding or rebound.  Musculoskeletal:        General: No tenderness or deformity.     Cervical back: Neck supple.     Comments: Status post bilateral lower extremity amputations, no tenderness palpation in cervical thoracic or lumbar spine  Skin:    General: Skin is warm and dry.     Findings: No rash.  Neurological:     General: No focal deficit present.     Mental Status: He is alert.     Cranial Nerves: No cranial nerve deficit (no facial droop, extraocular movements intact,  no slurred speech).     Sensory: No sensory deficit.     Motor: No abnormal muscle tone or seizure activity.     Coordination: Coordination normal.     Comments: Patient able to lift both legs off the bed,  able to squeeze my fingers bilaterally and left arms off the bed  Psychiatric:        Mood and Affect: Mood normal.    ED Results / Procedures / Treatments   Labs (all labs ordered are listed, but only abnormal results are displayed) Labs Reviewed - No data to display  EKG None  Radiology CT Head Wo Contrast  Result Date: 08/14/2020 CLINICAL DATA:  [place] out of bed with headache. EXAM: CT HEAD WITHOUT CONTRAST CT CERVICAL  SPINE WITHOUT CONTRAST TECHNIQUE: Multidetector CT imaging of the head and cervical spine was performed following the standard protocol without intravenous contrast. Multiplanar CT image reconstructions of the cervical spine were also generated. COMPARISON:  07/07/2020 FINDINGS: CT HEAD FINDINGS Brain: No evidence of acute infarction, hemorrhage, hydrocephalus, extra-axial collection or mass lesion/mass effect. Chronic low-density along the left external capsule is compatible with an old insult in this area. Vascular: No hyperdense vessel or unexpected calcification. Skull: Normal. Negative for fracture or focal lesion. Old injury to the left medial orbital wall. Sinuses/Orbits: Chronic opacification of the left mastoid air cells. Mild mucosal disease in the paranasal sinuses. Other: None. CT CERVICAL SPINE FINDINGS Alignment: Alignment is normal. Skull base and vertebrae: Significant limitation in the bone detail in the mid and lower cervical spine associated with motion and body habitus. No gross fracture or dislocation. Soft tissues and spinal canal: No prevertebral fluid or swelling. No visible canal hematoma. Disc levels: Disc space narrowing from C3-T1. Degenerative endplate changes in the cervical spine. Upper chest: Negative. Other: None IMPRESSION: 1. No acute intracranial abnormality. 2. Chronic opacification of left mastoid air cells. Mild paranasal sinus disease. 3. Limited evaluation of the cervical spine but no gross fracture or dislocation. 4. Multilevel degenerative changes in cervical spine. Electronically Signed   By: Markus Daft M.D.   On: 08/14/2020 08:32   CT Cervical Spine Wo Contrast  Result Date: 08/14/2020 CLINICAL DATA:  [place] out of bed with headache. EXAM: CT HEAD WITHOUT CONTRAST CT CERVICAL SPINE WITHOUT CONTRAST TECHNIQUE: Multidetector CT imaging of the head and cervical spine was performed following the standard protocol without intravenous contrast. Multiplanar CT image  reconstructions of the cervical spine were also generated. COMPARISON:  07/07/2020 FINDINGS: CT HEAD FINDINGS Brain: No evidence of acute infarction, hemorrhage, hydrocephalus, extra-axial collection or mass lesion/mass effect. Chronic low-density along the left external capsule is compatible with an old insult in this area. Vascular: No hyperdense vessel or unexpected calcification. Skull: Normal. Negative for fracture or focal lesion. Old injury to the left medial orbital wall. Sinuses/Orbits: Chronic opacification of the left mastoid air cells. Mild mucosal disease in the paranasal sinuses. Other: None. CT CERVICAL SPINE FINDINGS Alignment: Alignment is normal. Skull base and vertebrae: Significant limitation in the bone detail in the mid and lower cervical spine associated with motion and body habitus. No gross fracture or dislocation. Soft tissues and spinal canal: No prevertebral fluid or swelling. No visible canal hematoma. Disc levels: Disc space narrowing from C3-T1. Degenerative endplate changes in the cervical spine. Upper chest: Negative. Other: None IMPRESSION: 1. No acute intracranial abnormality. 2. Chronic opacification of left mastoid air cells. Mild paranasal sinus disease. 3. Limited evaluation of the cervical spine but no gross fracture  or dislocation. 4. Multilevel degenerative changes in cervical spine. Electronically Signed   By: Markus Daft M.D.   On: 08/14/2020 08:32    Procedures Procedures   Medications Ordered in ED Medications - No data to display  ED Course  I have reviewed the triage vital signs and the nursing notes.  Pertinent labs & imaging results that were available during my care of the patient were reviewed by me and considered in my medical decision making (see chart for details).  Clinical Course as of 08/14/20 0859  Mon Aug 14, 2020  9741 Head and C spine CT without acute findings [JK]    Clinical Course User Index [JK] Dorie Rank, MD   MDM  Rules/Calculators/A&P                          Patient presented to ED with complaints of headache after a fall out of bed.  Patient resides at a nursing facility.  He is on Plavix.  Patient also is on palliative care because of his cardiac issues.  Patient's head CT and C-spine CT did not show any acute abnormalities.  Patient noted to be hypertensive but otherwise no other acute abnormalities noted.  appears stable for discharge back to the nursing facility. Final Clinical Impression(s) / ED Diagnoses Final diagnoses:  Fall, initial encounter  Injury of head, initial encounter    Rx / DC Orders ED Discharge Orders     None        Dorie Rank, MD 08/14/20 [phone]

## 2020-08-14 NOTE — ED Notes (Signed)
Awaiting ride back to [facility]

## 2020-08-15 ENCOUNTER — Ambulatory Visit (HOSPITAL_COMMUNITY): Payer: Medicare (Managed Care)

## 2020-08-21 ENCOUNTER — Ambulatory Visit (HOSPITAL_COMMUNITY): Payer: Medicare (Managed Care) | Admitting: Hematology

## 2020-08-22 ENCOUNTER — Ambulatory Visit (HOSPITAL_COMMUNITY): Payer: Medicare (Managed Care)

## 2020-08-23 ENCOUNTER — Encounter (HOSPITAL_COMMUNITY): Payer: Medicare (Managed Care)

## 2020-09-07 ENCOUNTER — Ambulatory Visit: Payer: Medicare (Managed Care) | Admitting: Cardiology

## 2020-09-18 ENCOUNTER — Ambulatory Visit: Payer: Medicare (Managed Care) | Admitting: Urology

## 2020-09-18 DIAGNOSIS — R339 Retention of urine, unspecified: Secondary | ICD-10-CM

## 2020-09-18 DIAGNOSIS — R972 Elevated prostate specific antigen [PSA]: Secondary | ICD-10-CM

## 2020-09-19 ENCOUNTER — Other Ambulatory Visit (HOSPITAL_COMMUNITY): Payer: Medicare (Managed Care)

## 2020-09-21 ENCOUNTER — Ambulatory Visit (HOSPITAL_COMMUNITY): Payer: Medicare (Managed Care) | Admitting: Hematology

## 2020-10-12 DEATH — deceased

## 2022-03-17 LAB — BLOOD GAS, VENOUS
Acid-Base Excess: 12.3 mmol/L — ABNORMAL HIGH (ref 0.0–2.0)
Bicarbonate: 34.6 mmol/L — ABNORMAL HIGH (ref 20.0–28.0)
O2 Saturation: 66.3 %
Patient temperature: 37
pCO2, Ven: 74.4 mmHg (ref 44.0–60.0)
pH, Ven: 7.334 (ref 7.250–7.430)
pO2, Ven: 45.8 mmHg — ABNORMAL HIGH (ref 32.0–45.0)

## 2023-01-20 IMAGING — DX DG CHEST 1V PORT
1 series · 1 of 1 positions shown · non-contrast
Comparison: 04/01/2020

CLINICAL DATA: Shortness of breath, chest pain

EXAM:
PORTABLE CHEST 1 VIEW

[chest ap]
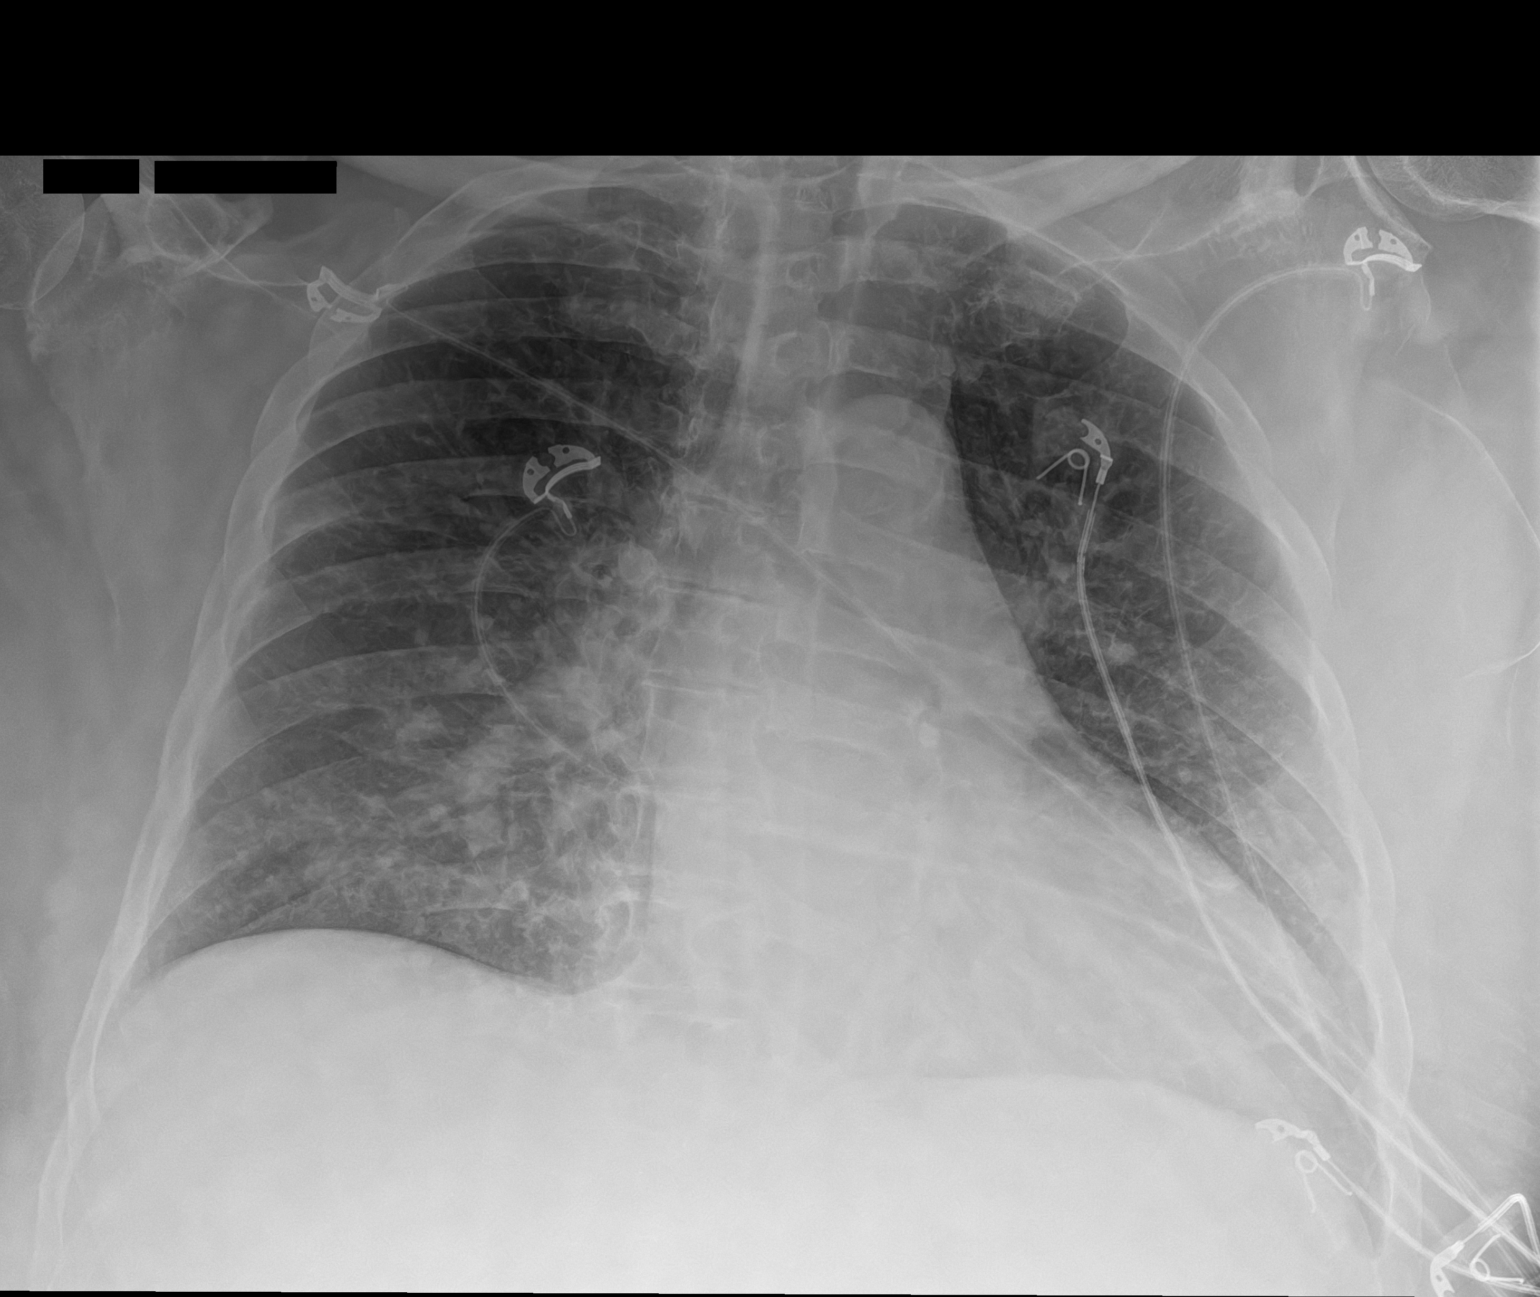

[1 of 1 positions shown; findings below may reference images not displayed]

FINDINGS: Single frontal view of the chest demonstrates a stable cardiac
silhouette. There is central vascular congestion, with mild
bilateral perihilar airspace disease greatest in the infrahilar
regions. No effusion or pneumothorax.
IMPRESSION: 1. Findings consistent with mild congestive heart failure.

## 2023-01-22 IMAGING — DX DG CHEST 2V
2 series · 2 of 2 positions shown · non-contrast
Comparison: April 26, 2020.

CLINICAL DATA: Shortness of breath, chest pain.

EXAM:
CHEST - 2 VIEW

[chest lat]
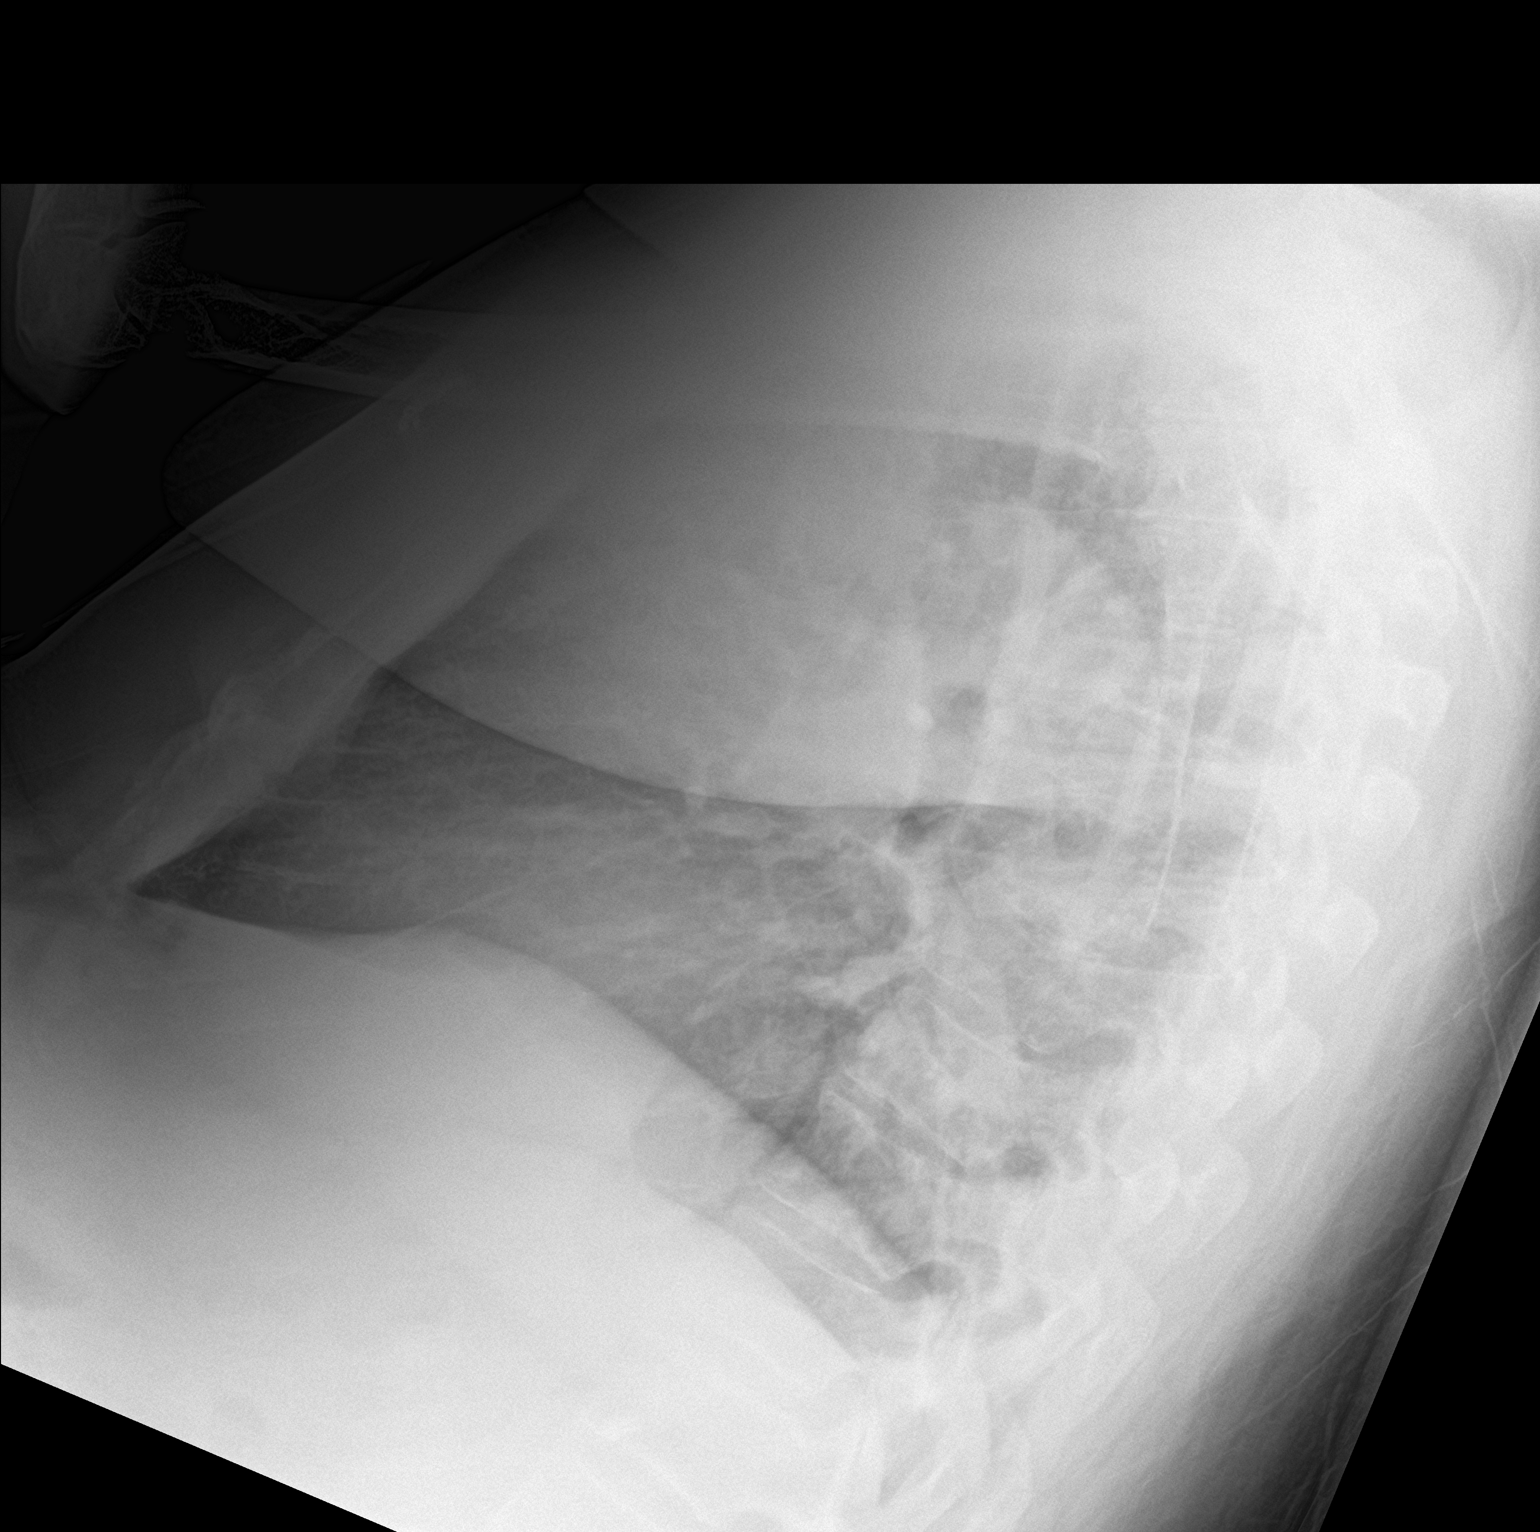

[chest ap]
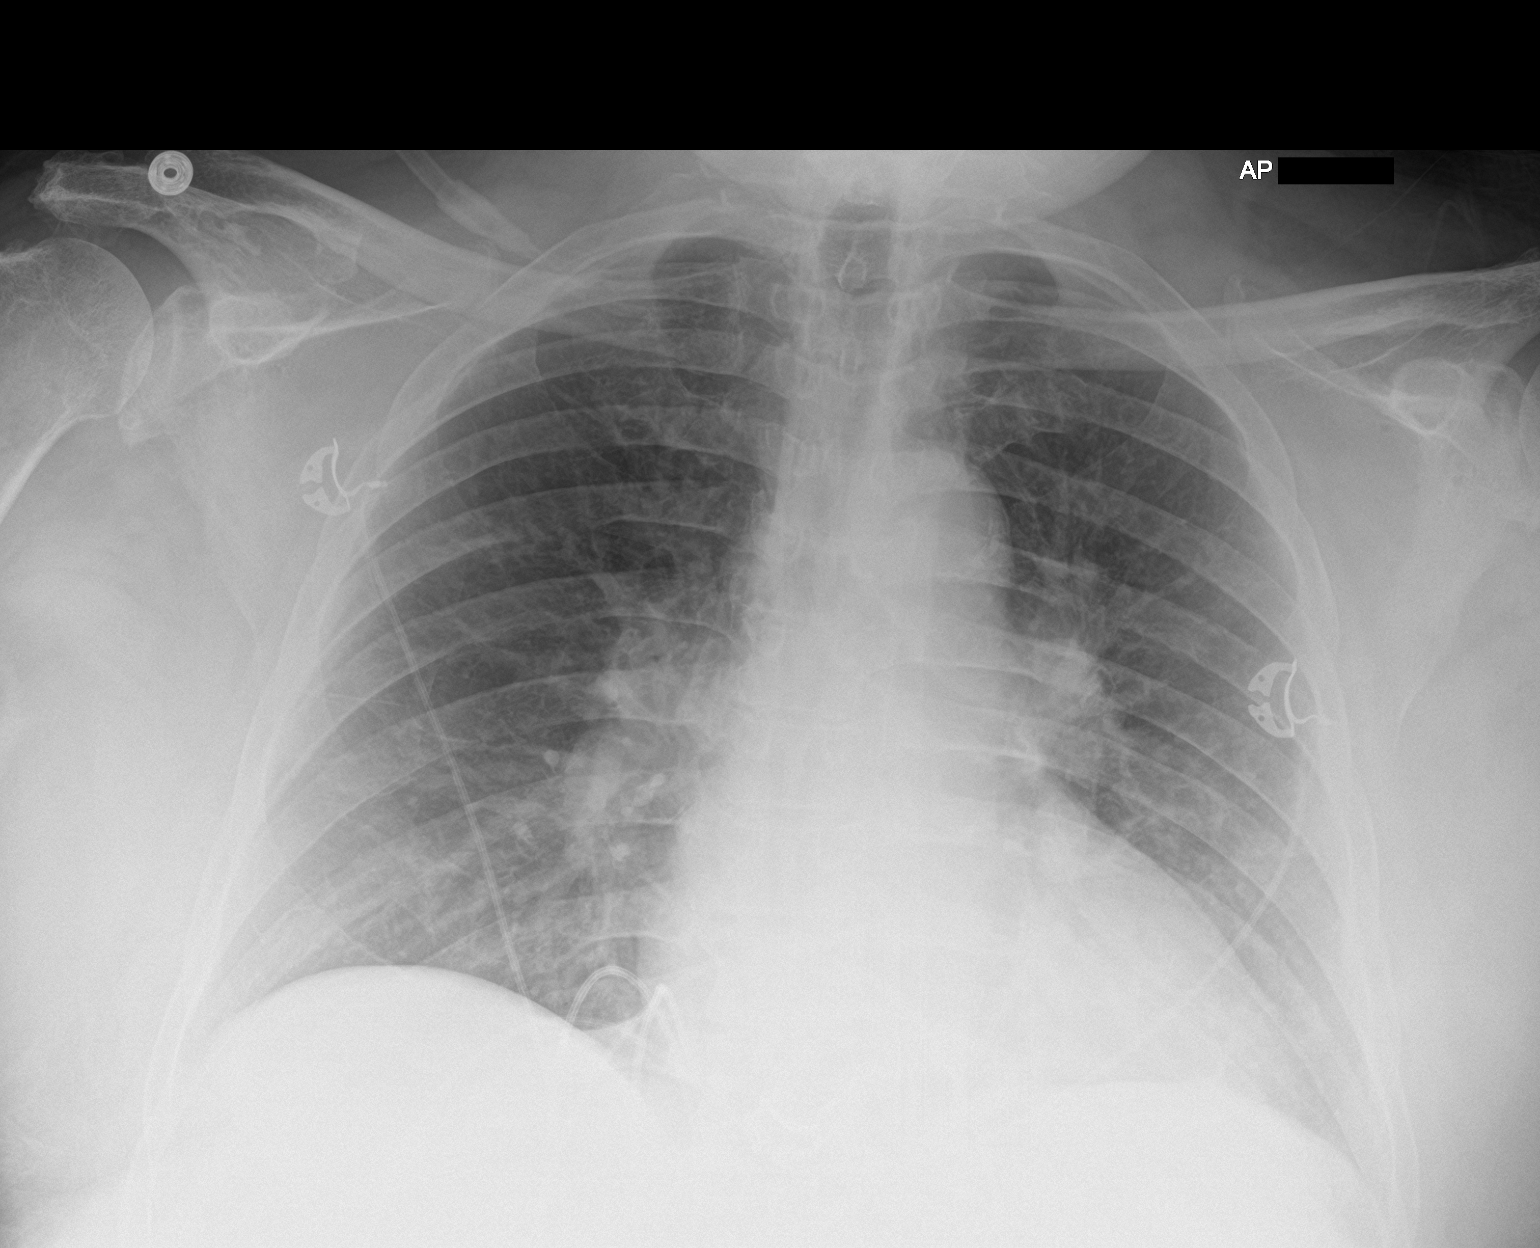

[2 of 2 positions shown; findings below may reference images not displayed]

FINDINGS: Stable cardiomediastinal silhouette. No pneumothorax or pleural
effusion is noted. Both lungs are clear. The visualized skeletal
structures are unremarkable.
IMPRESSION: No active cardiopulmonary disease.
